# Patient Record
Sex: Female | Born: 1935
Health system: Southern US, Community
[De-identification: ages and names within clinical notes are randomized; demographics above are authoritative.]

## PROBLEM LIST (undated history)

## (undated) ENCOUNTER — Emergency Department (HOSPITAL_COMMUNITY): Admission: EM

## (undated) DIAGNOSIS — I4891 Unspecified atrial fibrillation: Secondary | ICD-10-CM

## (undated) DIAGNOSIS — I10 Essential (primary) hypertension: Secondary | ICD-10-CM

## (undated) DIAGNOSIS — I2699 Other pulmonary embolism without acute cor pulmonale: Secondary | ICD-10-CM

## (undated) DIAGNOSIS — E039 Hypothyroidism, unspecified: Secondary | ICD-10-CM

## (undated) DIAGNOSIS — I471 Supraventricular tachycardia, unspecified: Secondary | ICD-10-CM

## (undated) DIAGNOSIS — J84112 Idiopathic pulmonary fibrosis: Secondary | ICD-10-CM

## (undated) DIAGNOSIS — M1990 Unspecified osteoarthritis, unspecified site: Secondary | ICD-10-CM

## (undated) DIAGNOSIS — K219 Gastro-esophageal reflux disease without esophagitis: Secondary | ICD-10-CM

## (undated) DIAGNOSIS — I509 Heart failure, unspecified: Secondary | ICD-10-CM

## (undated) DIAGNOSIS — H3530 Unspecified macular degeneration: Secondary | ICD-10-CM

## (undated) DIAGNOSIS — D126 Benign neoplasm of colon, unspecified: Secondary | ICD-10-CM

## (undated) DIAGNOSIS — I340 Nonrheumatic mitral (valve) insufficiency: Secondary | ICD-10-CM

## (undated) HISTORY — DX: Gastro-esophageal reflux disease without esophagitis: K21.9

## (undated) HISTORY — PX: OVARIAN CYST REMOVAL: SHX89

## (undated) HISTORY — DX: Idiopathic pulmonary fibrosis: J84.112

## (undated) HISTORY — DX: Unspecified osteoarthritis, unspecified site: M19.90

## (undated) HISTORY — DX: Heart failure, unspecified: I50.9

---

## 1958-02-14 HISTORY — PX: APPENDECTOMY: SHX54

## 1983-02-15 HISTORY — PX: CHOLECYSTECTOMY: SHX55

## 2004-10-29 ENCOUNTER — Ambulatory Visit: Payer: Self-pay | Admitting: Unknown Physician Specialty

## 2005-11-15 ENCOUNTER — Ambulatory Visit: Payer: Self-pay | Admitting: Unknown Physician Specialty

## 2006-01-03 ENCOUNTER — Emergency Department (HOSPITAL_COMMUNITY): Admission: EM | Admit: 2006-01-03 | Discharge: 2006-01-03 | Payer: Self-pay | Admitting: Emergency Medicine

## 2006-01-18 ENCOUNTER — Ambulatory Visit: Payer: Self-pay | Admitting: Unknown Physician Specialty

## 2006-11-23 ENCOUNTER — Ambulatory Visit: Payer: Self-pay | Admitting: Unknown Physician Specialty

## 2007-11-27 ENCOUNTER — Ambulatory Visit: Payer: Self-pay | Admitting: Unknown Physician Specialty

## 2008-12-19 ENCOUNTER — Ambulatory Visit: Payer: Self-pay | Admitting: Unknown Physician Specialty

## 2009-10-18 LAB — HM MAMMOGRAPHY: HM Mammogram: NORMAL

## 2009-10-18 LAB — HM DEXA SCAN

## 2009-10-18 LAB — HM COLONOSCOPY

## 2009-10-18 LAB — HM PAP SMEAR: HM Pap smear: NORMAL

## 2009-10-21 LAB — HM PAP SMEAR

## 2010-01-19 ENCOUNTER — Ambulatory Visit: Payer: Self-pay | Admitting: Unknown Physician Specialty

## 2010-06-07 ENCOUNTER — Emergency Department: Payer: Self-pay | Admitting: Unknown Physician Specialty

## 2010-07-29 ENCOUNTER — Ambulatory Visit: Payer: Self-pay | Admitting: Internal Medicine

## 2010-11-29 ENCOUNTER — Ambulatory Visit (INDEPENDENT_AMBULATORY_CARE_PROVIDER_SITE_OTHER): Payer: Medicare Other | Admitting: Internal Medicine

## 2010-11-29 ENCOUNTER — Encounter: Payer: Self-pay | Admitting: Internal Medicine

## 2010-11-29 VITALS — BP 143/80 | Temp 98.2°F | Ht 60.0 in | Wt 138.0 lb

## 2010-11-29 DIAGNOSIS — K219 Gastro-esophageal reflux disease without esophagitis: Secondary | ICD-10-CM | POA: Insufficient documentation

## 2010-11-29 DIAGNOSIS — R0602 Shortness of breath: Secondary | ICD-10-CM

## 2010-11-29 DIAGNOSIS — E785 Hyperlipidemia, unspecified: Secondary | ICD-10-CM | POA: Insufficient documentation

## 2010-11-29 DIAGNOSIS — Z1211 Encounter for screening for malignant neoplasm of colon: Secondary | ICD-10-CM | POA: Insufficient documentation

## 2010-11-29 DIAGNOSIS — H3530 Unspecified macular degeneration: Secondary | ICD-10-CM

## 2010-11-29 DIAGNOSIS — Z1239 Encounter for other screening for malignant neoplasm of breast: Secondary | ICD-10-CM

## 2010-11-29 NOTE — Patient Instructions (Addendum)
Your breathing problems may be aggravated by the smoke your trash burning makes.  You should wear a mask when you are doing this or stop burning your trash.    You need 1200 mg oc calcium and 1000 units of vitamin d for good bone health.  I will check your last bone density test to see if yo need more treatment.

## 2010-11-29 NOTE — Progress Notes (Signed)
  Subjective:    Patient ID: Frances Kent, female    DOB: [birth year], 77 y.o.   MRN: [id number]  HPI Frances Kent is a healthy 77 yo female referred here by several family members to establish care.  Her previous PCP was Francia Greaves of [facility]. She feels geneally well but has a recent history of acute dyspnea. Since March has had several episodes of acute dyspnea and chest tightness unable to draw her breath,  Unassociated with diaphoresis, nausea or jaw/shoulder/arm pain.  She reports that it initially occurred after eating soup.  She was treated in ER for acid reflux with Nexium and albuterol and referred to Dr. Welton Flakes, pulmonologist who worked her up for PR and asthma with a CT scan and pulmonary function tests..  She has not had an epsiode in several weeks  [place]  But she and other family members burn their trash regularly  and do not use masks .   Past Medical History  Diagnosis Date  . GERD (gastroesophageal reflux disease)     No current outpatient prescriptions on file prior to visit.    Review of Systems  Constitutional: Negative for fever, chills and unexpected weight change.  HENT: Negative for hearing loss, ear pain, nosebleeds, congestion, sore throat, facial swelling, rhinorrhea, sneezing, mouth sores, trouble swallowing, neck pain, neck stiffness, voice change, postnasal drip, sinus pressure, tinnitus and ear discharge.   Eyes: Negative for pain, discharge, redness and visual disturbance.  Respiratory: Negative for cough, chest tightness, shortness of breath, wheezing and stridor.   Cardiovascular: Negative for chest pain, palpitations and leg swelling.  Musculoskeletal: Negative for myalgias and arthralgias.  Skin: Negative for color change and rash.  Neurological: Negative for dizziness, weakness, light-headedness and headaches.  Hematological: Negative for adenopathy.  Psychiatric/Behavioral: Positive for  sleep disturbance.   BP 143/80  Temp 98.2 F (36.8 C)  Ht 5' (1.524 m)  Wt 138 lb (62.596 kg)  BMI 26.95 kg/m2     Objective:   Physical Exam  Constitutional: She is oriented to person, place, and time. She appears well-developed and well-nourished.  HENT:  Mouth/Throat: Oropharynx is clear and moist.  Eyes: EOM are normal. Pupils are equal, round, and reactive to light. No scleral icterus.  Neck: Normal range of motion. Neck supple. No JVD present. No thyromegaly present.  Cardiovascular: Normal rate, regular rhythm, normal heart sounds and intact distal pulses.   Pulmonary/Chest: Effort normal and breath sounds normal.  Abdominal: Soft. Bowel sounds are normal. She exhibits no mass. There is no tenderness.  Musculoskeletal: Normal range of motion. She exhibits no edema.  Lymphadenopathy:    She has no cervical adenopathy.  Neurological: She is alert and oriented to person, place, and time.  Skin: Skin is warm and dry.  Psychiatric: She has a normal mood and affect.          Assessment & Plan:  Dyspnea:  etiology presumed to be due to esophgeal spasm from reflux since her cough and her episodes have resolved with daily use of Nexium. but willl request records from Dr. Welton Flakes.  Symptoms may be aggravated by pollution from burning trash.  Advised to refrain from burning trash near her home or to at least wear a  mask when outside, and to keep windows closed. She has had no cardiology workup but this may be pruent in the future if symptoms recur given borherline HTN and history of hyperlipidemia.

## 2011-01-17 ENCOUNTER — Encounter: Payer: Self-pay | Admitting: Internal Medicine

## 2011-01-17 ENCOUNTER — Ambulatory Visit (INDEPENDENT_AMBULATORY_CARE_PROVIDER_SITE_OTHER): Payer: Medicare Other | Admitting: Internal Medicine

## 2011-01-17 VITALS — BP 128/74 | HR 72 | Temp 98.1°F | Resp 14 | Ht 62.5 in | Wt 141.5 lb

## 2011-01-17 DIAGNOSIS — J84112 Idiopathic pulmonary fibrosis: Secondary | ICD-10-CM | POA: Insufficient documentation

## 2011-01-17 DIAGNOSIS — N765 Ulceration of vagina: Secondary | ICD-10-CM

## 2011-01-17 DIAGNOSIS — N72 Inflammatory disease of cervix uteri: Secondary | ICD-10-CM

## 2011-01-17 DIAGNOSIS — E785 Hyperlipidemia, unspecified: Secondary | ICD-10-CM

## 2011-01-17 DIAGNOSIS — R5383 Other fatigue: Secondary | ICD-10-CM

## 2011-01-17 DIAGNOSIS — Z0000 Encounter for general adult medical examination without abnormal findings: Secondary | ICD-10-CM

## 2011-01-17 DIAGNOSIS — Z1239 Encounter for other screening for malignant neoplasm of breast: Secondary | ICD-10-CM

## 2011-01-17 DIAGNOSIS — M8580 Other specified disorders of bone density and structure, unspecified site: Secondary | ICD-10-CM

## 2011-01-17 DIAGNOSIS — R5381 Other malaise: Secondary | ICD-10-CM

## 2011-01-17 DIAGNOSIS — N766 Ulceration of vulva: Secondary | ICD-10-CM

## 2011-01-17 DIAGNOSIS — B009 Herpesviral infection, unspecified: Secondary | ICD-10-CM

## 2011-01-17 LAB — COMPREHENSIVE METABOLIC PANEL
Albumin: 4.4 g/dL (ref 3.5–5.2)
BUN: 12 mg/dL (ref 6–23)
CO2: 26 mEq/L (ref 19–32)
Calcium: 9.2 mg/dL (ref 8.4–10.5)
Chloride: 107 mEq/L (ref 96–112)
GFR: 65.13 mL/min (ref >60.00)
Glucose, Bld: 90 mg/dL (ref 70–99)
Potassium: 4.4 mEq/L (ref 3.5–5.1)
Sodium: 142 mEq/L (ref 135–145)
Total Protein: 7.2 g/dL (ref 6.0–8.3)

## 2011-01-17 LAB — CBC WITH DIFFERENTIAL/PLATELET
Basophils Absolute: 0.1 10*3/uL (ref 0.0–0.1)
Eosinophils Relative: 1.7 % (ref 0.0–5.0)
Lymphs Abs: 2.1 10*3/uL (ref 0.7–4.0)
Monocytes Relative: 7.5 % (ref 3.0–12.0)
Neutrophils Relative %: 48.2 % (ref 43.0–77.0)
Platelets: 237 10*3/uL (ref 150.0–400.0)
RDW: 14.8 % — ABNORMAL HIGH (ref 11.5–14.6)
WBC: 5.2 10*3/uL (ref 4.5–10.5)

## 2011-01-17 LAB — TSH: TSH: 3.04 u[IU]/mL (ref 0.35–5.50)

## 2011-01-17 LAB — LDL CHOLESTEROL, DIRECT: Direct LDL: 181.4 mg/dL

## 2011-01-17 LAB — LIPID PANEL: HDL: 74.8 mg/dL (ref >39.00)

## 2011-01-17 MED ORDER — ACYCLOVIR 400 MG PO TABS: 400.0000 mg | ORAL_TABLET | ORAL | Status: AC

## 2011-01-17 NOTE — Patient Instructions (Signed)
We will schedule your mammogram and bone density test in the next month.  I encourage you to walkd for 20 minutes a few taimes weekly .  Keep your followup with Dr. Welton Flakes

## 2011-01-17 NOTE — Progress Notes (Signed)
  Subjective:    Frances Kent is a 77 y.o. female who presents for an annual exam. The patient has no complaints today. The patient is currently sexually active. GYN screening history: last pap: approximate date Sept 2011 and was normal. The patient wears seatbelts: yes. The patient participates in regular exercise: no. Has the patient ever been transfused or tattooed?: no. The patient reports that there is not domestic violence in her life.   Menstrual History: OB History    Grav Para Term Preterm Abortions TAB SAB Ect Mult Living                  Menarche age: 41    The following portions of the patient's history were reviewed and updated as appropriate: allergies, current medications, past family history, past medical history, past social history, past surgical history and problem list.  Review of Systems A comprehensive review of systems was negative except for: Genitourinary: positive for pruritic area at natal cleft that breaks out Musculoskeletal: positive for arthralgias    Objective:    BP 128/74  Pulse 72  Temp(Src) 98.1 F (36.7 C) (Oral)  Resp 14  Ht 5' 2.5" (1.588 m)  Wt 141 lb 8 oz (64.184 kg)  BMI 25.47 kg/m2  SpO2 99%  General Appearance:    Alert, cooperative, no distress, appears stated age  Head:    Normocephalic, without obvious abnormality, atraumatic  Eyes:    PERRL, conjunctiva/corneas clear, EOM's intact, fundi    benign, both eyes  Ears:    Normal TM's and external ear canals, both ears  Nose:   Nares normal, septum midline, mucosa normal, no drainage    or sinus tenderness  Throat:   Lips, mucosa, and tongue normal; teeth and gums normal  Neck:   Supple, symmetrical, trachea midline, no adenopathy;    thyroid:  no enlargement/tenderness/nodules; no carotid   bruit or JVD  Back:     Symmetric, no curvature, ROM normal, no CVA tenderness  Lungs:     Clear to auscultation bilaterally, respirations unlabored  Chest Wall:    No tenderness or  deformity   Heart:    Regular rate and rhythm, S1 and S2 normal, no murmur, rub   or gallop  Breast Exam:    No tenderness, masses, or nipple abnormality  Abdomen:     Soft, non-tender, bowel sounds active all four quadrants,    no masses, no organomegaly  Genitalia:    Once dime sized ulcerated lesion right labia minorNormal female without lesion, discharge or tenderness  Rectal:    3 discrete ulcerations in natal cleft  Extremities:   Extremities normal, atraumatic, no cyanosis or edema  Pulses:   2+ and symmetric all extremities  Skin:   Skin color, texture, turgor normal, no rashes or lesions  Lymph nodes:   Cervical, supraclavicular, and axillary nodes normal  Neurologic:   CNII-XII intact, normal strength, sensation and reflexes    throughout  .    Assessment:    Healthy female exam.    Plan:     Breast self exam technique reviewed and patient encouraged to perform self-exam monthly. Follow up in 6 months. Mammogram.

## 2011-01-18 DIAGNOSIS — N766 Ulceration of vulva: Secondary | ICD-10-CM | POA: Insufficient documentation

## 2011-01-18 LAB — HSV 1 ANTIBODY, IGG: HSV 1 Glycoprotein G Ab, IgG: 0.16 IV

## 2011-01-18 LAB — HSV 2 ANTIBODY, IGG: HSV 2 Glycoprotein G Ab, IgG: 9 IV — ABNORMAL HIGH

## 2011-01-18 NOTE — Assessment & Plan Note (Signed)
LDL is 181 , HDL is in the 70's.  Prior LDL was 161 in 3/12 , HDL was 86.  FH of stroke but no early CAD.  Will recommended trial of pravastatin.

## 2011-01-18 NOTE — Assessment & Plan Note (Signed)
She has a labial lesion and three in the natal cleft that suggest herpes simplex but were not intact so Tzanck could not be done.  Serologies for HS Simplex 1 and 2 were sent.  Both of her husbands had extramarital affairs, so the likelihood is high that she is infected.  RX for acyclovir given to patient to start if serology is positive.

## 2011-01-19 ENCOUNTER — Telehealth: Payer: Self-pay | Admitting: Internal Medicine

## 2011-01-19 MED ORDER — PRAVASTATIN SODIUM 40 MG PO TABS: 40.0000 mg | ORAL_TABLET | Freq: Every evening | ORAL | Status: DC

## 2011-01-19 NOTE — Telephone Encounter (Signed)
Message copied by Duncan Dull on Wed Jan 19, 2011  6:48 PM ------      Message from: Darletta Moll A      Created: Wed Jan 19, 2011  1:37 PM       Notified patient of results.  She stated she has never taken a cholesterol medication before.

## 2011-01-19 NOTE — Telephone Encounter (Signed)
I am recommending a trial of pravastatin bc her cholesterol is so high, to prevent strokes and heart attacks.  rx sent to pharmacy.  If she decides to try it I would like her to return in 6 weeks  for a CMET and repeat fasting lipids.

## 2011-01-19 NOTE — Progress Notes (Signed)
Addended by: Duncan Dull on: 01/19/2011 06:48 PM   Modules accepted: Orders

## 2011-01-21 NOTE — Telephone Encounter (Signed)
Patient notified of message, she doesn't know if she wants to start it yet, but will let us know what she wants to do.

## 2011-02-21 LAB — HM MAMMOGRAPHY: HM Mammogram: NORMAL

## 2011-03-09 ENCOUNTER — Ambulatory Visit: Payer: Self-pay | Admitting: Internal Medicine

## 2011-04-12 ENCOUNTER — Other Ambulatory Visit: Payer: Self-pay | Admitting: Internal Medicine

## 2011-04-12 MED ORDER — ESOMEPRAZOLE MAGNESIUM 40 MG PO CPDR: 40.0000 mg | DELAYED_RELEASE_CAPSULE | Freq: Every day | ORAL | Status: DC

## 2011-04-14 ENCOUNTER — Telehealth: Payer: Self-pay | Admitting: Internal Medicine

## 2011-04-14 NOTE — Telephone Encounter (Signed)
[phone] Pt called wanting to be seen today or tomorrow for cough she has had this for 2 weeks.

## 2011-04-14 NOTE — Telephone Encounter (Signed)
I called patient she stated she has had a non-productive call for 2 weeks.  She made an appt for tomorrow.

## 2011-04-15 ENCOUNTER — Encounter: Payer: Self-pay | Admitting: Internal Medicine

## 2011-04-15 ENCOUNTER — Ambulatory Visit (INDEPENDENT_AMBULATORY_CARE_PROVIDER_SITE_OTHER): Payer: 59 | Admitting: Internal Medicine

## 2011-04-15 DIAGNOSIS — R05 Cough: Secondary | ICD-10-CM

## 2011-04-15 DIAGNOSIS — R059 Cough, unspecified: Secondary | ICD-10-CM

## 2011-04-15 NOTE — Progress Notes (Signed)
Subjective:    Patient ID: Frances Kent, female    DOB: 04/29/1930, 78 y.o.   MRN: [id number]  HPI  presents with 3 week history of cough, occasionally productive of clear sputum..  No sinus pain or congestion .  No wheezing or or shortness of breath.  No recent travel ,  Some sick contacts with grandchildren ages 43 and 12 has tried alka seltzer cold plus, no effect .Marland Kitchen  Past Medical History  Diagnosis Date  . GERD (gastroesophageal reflux disease)   . IPF (idiopathic pulmonary fibrosis) 20111    by CT, PFTS done by [facility]   Current Outpatient Prescriptions on File Prior to Visit  Medication Sig Dispense Refill  . b complex vitamins tablet Take 1 tablet by mouth daily.        . calcium-vitamin D (OSCAL WITH D) 250-125 MG-UNIT per tablet Take 1 tablet by mouth daily.        Marland Kitchen esomeprazole (NEXIUM) 40 MG capsule Take 1 capsule (40 mg total) by mouth daily.  30 capsule  3  . pravastatin (PRAVACHOL) 40 MG tablet Take 1 tablet (40 mg total) by mouth every evening.  30 tablet  11    Review of Systems  Constitutional: Negative for fever, chills and unexpected weight change.  HENT: Negative for hearing loss, ear pain, nosebleeds, congestion, sore throat, facial swelling, rhinorrhea, sneezing, mouth sores, trouble swallowing, neck pain, neck stiffness, voice change, postnasal drip, sinus pressure, tinnitus and ear discharge.   Eyes: Negative for pain, discharge, redness and visual disturbance.  Respiratory: Negative for cough, chest tightness, shortness of breath, wheezing and stridor.   Cardiovascular: Negative for chest pain, palpitations and leg swelling.  Musculoskeletal: Negative for myalgias and arthralgias.  Skin: Negative for color change and rash.  Neurological: Negative for dizziness, weakness, light-headedness and headaches.  Hematological: Negative for adenopathy.       Objective:   Physical Exam  Constitutional: She is oriented to person, place, and time. She appears  well-developed and well-nourished.  HENT:  Mouth/Throat: Oropharynx is clear and moist.  Eyes: EOM are normal. Pupils are equal, round, and reactive to light. No scleral icterus.  Neck: Normal range of motion. Neck supple. No JVD present. No thyromegaly present.  Cardiovascular: Normal rate, regular rhythm, normal heart sounds and intact distal pulses.   Pulmonary/Chest: Effort normal and breath sounds normal.  Abdominal: Soft. Bowel sounds are normal. She exhibits no mass. There is no tenderness.  Musculoskeletal: Normal range of motion. She exhibits no edema.  Lymphadenopathy:    She has no cervical adenopathy.  Neurological: She is alert and oriented to person, place, and time.  Skin: Skin is warm and dry.  Psychiatric: She has a normal mood and affect.          Assessment & Plan:   Cough Her history and exam are not suggestive of anything more than viral infection.  Recommended benadryl, saline lavage, and delsym.  Retrun in one week if no improvement    Updated Medication List Outpatient Encounter Prescriptions as of 04/15/2011  Medication Sig Dispense Refill  . b complex vitamins tablet Take 1 tablet by mouth daily.        . calcium-vitamin D (OSCAL WITH D) 250-125 MG-UNIT per tablet Take 1 tablet by mouth daily.        Marland Kitchen esomeprazole (NEXIUM) 40 MG capsule Take 1 capsule (40 mg total) by mouth daily.  30 capsule  3  . pravastatin (PRAVACHOL) 40 MG tablet Take 1  tablet (40 mg total) by mouth every evening.  30 tablet  11

## 2011-04-15 NOTE — Patient Instructions (Signed)
I think your cough may be coming from post nasal drainage  I would like you to take generic benadryl (dipenhydramine) 25 mg up to three times daily,  Take one hour before bedtime.      I also want you to use salt water nasal spray called Simply Saline:    Two squirts up each side twice dailyu, in the morning and in the evening   If no better by Monday,  Call and I will send you an antibiotic to take.

## 2011-04-17 DIAGNOSIS — R05 Cough: Secondary | ICD-10-CM | POA: Insufficient documentation

## 2011-04-17 DIAGNOSIS — R059 Cough, unspecified: Secondary | ICD-10-CM | POA: Insufficient documentation

## 2011-04-17 NOTE — Assessment & Plan Note (Signed)
Her history and exam are not suggestive of anything more than viral infection.  Recommended benadryl, saline lavage, and delsym.  Retrun in one week if no improvement

## 2011-04-20 ENCOUNTER — Encounter: Payer: Self-pay | Admitting: Internal Medicine

## 2011-07-19 ENCOUNTER — Encounter: Payer: Self-pay | Admitting: Internal Medicine

## 2011-07-19 ENCOUNTER — Ambulatory Visit (INDEPENDENT_AMBULATORY_CARE_PROVIDER_SITE_OTHER): Payer: 59 | Admitting: Internal Medicine

## 2011-07-19 VITALS — BP 140/70 | HR 65 | Temp 97.8°F | Resp 14 | Wt 140.2 lb

## 2011-07-19 DIAGNOSIS — M79609 Pain in unspecified limb: Secondary | ICD-10-CM

## 2011-07-19 DIAGNOSIS — E785 Hyperlipidemia, unspecified: Secondary | ICD-10-CM

## 2011-07-19 DIAGNOSIS — M25561 Pain in right knee: Secondary | ICD-10-CM

## 2011-07-19 NOTE — Patient Instructions (Signed)
You can try red yeast rice in capsule form  for 600 mg twice daily instead of pravastatin .    First take a month break from the pravastatin,  then try the red yeast rice.    I recommend taking a baby arpirin daily to prevent strokes and heart attacks,  81 mg   You can use 2 tylenol and2  Ibuprofen   tiwce daily for pain

## 2011-07-19 NOTE — Assessment & Plan Note (Signed)
She agreed to trial of pravastatin for LDL 175 in December but has had increased arthralgias since starting it.  Suspended statin for now.

## 2011-07-19 NOTE — Assessment & Plan Note (Signed)
She has crepitus without point tenderness on exam.  pain has been aggravated by statin therapy.  Discussed suspending therapy for a few months.  exercises given.

## 2011-07-19 NOTE — Progress Notes (Signed)
Patient ID: SHAWNAY BRAMEL, female   DOB: January 09, 1931, 78 y.o.   MRN: [id number]  Patient Active Problem List  Diagnoses  . Screening for breast cancer  . Macular degeneration, left eye  . Screening for colon cancer  . GERD (gastroesophageal reflux disease)  . Hyperlipidemia LDL goal <160  . IPF (idiopathic pulmonary fibrosis)  . Genital ulcer, female  . Cough  . Knee pain, acute, right    Subjective:  CC:   Chief Complaint  Patient presents with  . Follow-up    HPI:   Rotha Cassels Fergusonis a 78 y.o. female who presents for 6 month follow up on chronic conditions.  Feels generally well except for right knee pain which has become more severe with changes in position and descending stairs.  It has been more pronounced since she start taking pravasttin for LDL 182.  She is Not taking aspirin daily.    Past Medical History  Diagnosis Date  . GERD (gastroesophageal reflux disease)   . IPF (idiopathic pulmonary fibrosis) 20111    by CT, PFTS done by Welton Flakes    Past Surgical History  Procedure Date  . Appendectomy 1960  . Cholecystectomy 1985         The following portions of the patient's history were reviewed and updated as appropriate: Allergies, current medications, and problem list.    Review of Systems:   12 Pt  review of systems was negative except those addressed in the HPI,     History   Social History  . Marital Status: Single    Spouse Name: N/A    Number of Children: N/A  . Years of Education: N/A   Occupational History  . Not on file.   Social History Main Topics  . Smoking status: Never Smoker   . Smokeless tobacco: Never Used   Comment: 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 , worked at [employer], [employer]  . Alcohol Use: No  . Drug Use: No  . Sexually Active: Not on file   Other Topics Concern  . Not on file   Social History Narrative   Has caretaking responsibility for 3 young grandchildren who live with her    Objective:  BP 140/70  Pulse 65   Temp(Src) 97.8 F (36.6 C) (Oral)  Resp 14  Wt 140 lb 4 oz (63.617 kg)  SpO2 96%  General appearance: alert, cooperative and appears stated age Ears: normal TM's and external ear canals both ears Throat: lips, mucosa, and tongue normal; teeth and gums normal Neck: no adenopathy, no carotid bruit, supple, symmetrical, trachea midline and thyroid not enlarged, symmetric, no tenderness/mass/nodules Back: symmetric, no curvature. ROM normal. No CVA tenderness. Lungs: clear to auscultation bilaterally Heart: regular rate and rhythm, S1, S2 normal, no murmur, click, rub or gallop Abdomen: soft, non-tender; bowel sounds normal; no masses,  no organomegaly Pulses: 2+ and symmetric Skin: Skin color, texture, turgor normal. No rashes or lesions Lymph nodes: Cervical, supraclavicular, and axillary nodes normal.  Assessment and Plan:  Knee pain, acute, right She has crepitus without point tenderness on exam.  pain has been aggravated by statin therapy.  Discussed suspending therapy for a few months.  exercises given.   Hyperlipidemia LDL goal <160 She agreed to trial of pravastatin for LDL 175 in December but has had increased arthralgias since starting it.  Suspended statin for now.     Updated Medication List Outpatient Encounter Prescriptions as of 07/19/2011  Medication Sig Dispense Refill  . b complex vitamins tablet Take 1  tablet by mouth daily.        . calcium-vitamin D (OSCAL WITH D) 250-125 MG-UNIT per tablet Take 1 tablet by mouth daily.        Marland Kitchen esomeprazole (NEXIUM) 40 MG capsule Take 1 capsule (40 mg total) by mouth daily.  30 capsule  3  . pravastatin (PRAVACHOL) 40 MG tablet Take 1 tablet (40 mg total) by mouth every evening.  30 tablet  11     No orders of the defined types were placed in this encounter.    No Follow-up on file.

## 2011-08-09 ENCOUNTER — Other Ambulatory Visit: Payer: Self-pay | Admitting: Internal Medicine

## 2011-08-09 DIAGNOSIS — Z1211 Encounter for screening for malignant neoplasm of colon: Secondary | ICD-10-CM

## 2011-08-19 ENCOUNTER — Other Ambulatory Visit: Payer: 59

## 2011-08-19 DIAGNOSIS — Z1211 Encounter for screening for malignant neoplasm of colon: Secondary | ICD-10-CM

## 2011-08-23 ENCOUNTER — Ambulatory Visit: Payer: Self-pay | Admitting: Internal Medicine

## 2011-08-25 NOTE — ED Provider Notes (Signed)
Order(s) created erroneously. Erroneous order ID: [id number] Order moved by: Lurline Hare Order move date/time: 08/25/2011  3:48 PM Source Patient:    [id number] Source Contact: 07/19/2011 Destination Patient:   [id number] Destination Contact: 11/22/2010

## 2011-09-13 ENCOUNTER — Other Ambulatory Visit: Payer: Self-pay | Admitting: Internal Medicine

## 2011-10-12 ENCOUNTER — Encounter: Payer: Self-pay | Admitting: Internal Medicine

## 2011-10-12 ENCOUNTER — Ambulatory Visit (INDEPENDENT_AMBULATORY_CARE_PROVIDER_SITE_OTHER): Payer: 59 | Admitting: Internal Medicine

## 2011-10-12 VITALS — BP 120/70 | HR 70 | Temp 98.0°F | Resp 16

## 2011-10-12 DIAGNOSIS — R319 Hematuria, unspecified: Secondary | ICD-10-CM

## 2011-10-12 DIAGNOSIS — K921 Melena: Secondary | ICD-10-CM

## 2011-10-12 LAB — CBC WITH DIFFERENTIAL/PLATELET
Basophils Absolute: 0.1 10*3/uL (ref 0.0–0.1)
Basophils Relative: 2 % — ABNORMAL HIGH (ref 0–1)
Eosinophils Relative: 1 % (ref 0–5)
HCT: 40.1 % (ref 36.0–46.0)
Hemoglobin: 13.5 g/dL (ref 12.0–15.0)
MCH: 29.5 pg (ref 26.0–34.0)
MCHC: 33.7 g/dL (ref 30.0–36.0)
MCV: 87.6 fL (ref 78.0–100.0)
Monocytes Absolute: 0.6 10*3/uL (ref 0.1–1.0)
Monocytes Relative: 10 % (ref 3–12)
RDW: 15 % (ref 11.5–15.5)

## 2011-10-12 LAB — POCT URINALYSIS DIPSTICK
Ketones, UA: NEGATIVE
Protein, UA: NEGATIVE
Spec Grav, UA: 1.02
pH, UA: 7

## 2011-10-12 LAB — IRON AND TIBC
%SAT: 31 % (ref 20–55)
Iron: 94 ug/dL (ref 42–145)

## 2011-10-12 NOTE — Progress Notes (Signed)
Patient ID: Frances Kent, female   DOB: November 22, 1930, 78 y.o.   MRN: [id number]   Patient Active Problem List  Diagnosis  . Screening for breast cancer  . Macular degeneration, left eye  . Screening for colon cancer  . GERD (gastroesophageal reflux disease)  . Hyperlipidemia LDL goal <160  . IPF (idiopathic pulmonary fibrosis)  . Genital ulcer, female  . Cough  . Knee pain, acute, right  . Hematochezia    Subjective:  CC:   Chief Complaint  Patient presents with  . Hematuria    doesn't know where the blood is coming from  . Rectal Bleeding    HPI:   Frances Kent a 78 y.o. female who presents 4 to 6 week history of intermittent bright red blood noticed when she uses the bathroom.  Not on toilet paper .  But sees it in the water when she has a bowel movement.  Bowels move usually twice daily , no straining and no pain with defecation. No abdominal pain. Does not take any anticoagulants. She has noticed more gas and bloating lately.  She is not certain whether the blood is coming from her bowels or from her bladder. She has had no prior screening for colon cancer as she has  refused colonoscopies .    Past Medical History  Diagnosis Date  . GERD (gastroesophageal reflux disease)   . IPF (idiopathic pulmonary fibrosis) 20111    by CT, PFTS done by Welton Flakes    Past Surgical History  Procedure Date  . Appendectomy 1960  . Cholecystectomy 1985    The following portions of the patient's history were reviewed and updated as appropriate: Allergies, current medications, and problem list.    Review of Systems:   A comprehensive ROS was done and positive for occasional rectal bleeding, increased gas, and joint pain (chronic).   The rest was negative.   History   Social History  . Marital Status: Single    Spouse Name: N/A    Number of Children: N/A  . Years of Education: N/A   Occupational History  . Not on file.   Social History Main Topics  . Smoking status:  Never Smoker   . Smokeless tobacco: Never Used   Comment: 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 , worked at [employer], [employer]  . Alcohol Use: No  . Drug Use: No  . Sexually Active: Not on file   Other Topics Concern  . Not on file   Social History Narrative   Has caretaking responsibility for 3 young grandchildren who live with her    Objective:  BP 120/70  Pulse 70  Temp 98 F (36.7 C) (Oral)  Resp 16  SpO2 97%  General appearance: alert, cooperative and appears stated age Neck: no adenopathy, no carotid bruit, supple, symmetrical, trachea midline and thyroid not enlarged, symmetric, no tenderness/mass/nodules Back: symmetric, no curvature. ROM normal. No CVA tenderness. Lungs: clear to auscultation bilaterally Heart: regular rate and rhythm, S1, S2 normal, no murmur, click, rub or gallop Abdomen: soft, non-tender; bowel sounds normal; no masses,  no organomegaly Pulses: 2+ and symmetric Skin: Skin color, texture, turgor normal. No rashes or lesions Rectal:  nontender exam,  No hemorrhoids ,  FOBT trace positive Lymph nodes: Cervical, supraclavicular, and axillary nodes normal.  Assessment and Plan:  Hematochezia Her rectal exam was nontender and the fecal cold blood test was faintly positive. Urinalysis was trace positive but sent for microscopic analysis which was negative for blood. She is not anemic  and her iron stores are normal. I have recommended that she have a colonoscopy and she is finally acquiesced. Will refer to local GI for screening. Her   Updated Medication List Outpatient Encounter Prescriptions as of 10/12/2011  Medication Sig Dispense Refill  . b complex vitamins tablet Take 1 tablet by mouth daily.        . calcium-vitamin D (OSCAL WITH D) 250-125 MG-UNIT per tablet Take 1 tablet by mouth daily.        Marland Kitchen NEXIUM 40 MG capsule TAKE ONE CAPSULE BY MOUTH EVERY DAY  30 each  6  . DISCONTD: pravastatin (PRAVACHOL) 40 MG tablet Take 1 tablet (40 mg total) by mouth every  evening.  30 tablet  11     Orders Placed This Encounter  Procedures  . Urinalysis, Routine w reflex microscopic [[facility]]  . CBC with Differential  . Ferritin  . Iron and TIBC  . CBC with Differential  . Ferritin  . Iron and TIBC  . Urinalysis, Routine w reflex microscopic  . Ambulatory referral to Gastroenterology  . POCT Urinalysis Dipstick    No Follow-up on file.

## 2011-10-12 NOTE — Patient Instructions (Addendum)
I am referring you to Dr. Mechele Collin for a colonoscopy  Please ask your eye doctor about the aspirin

## 2011-10-13 DIAGNOSIS — K921 Melena: Secondary | ICD-10-CM | POA: Insufficient documentation

## 2011-10-13 LAB — URINALYSIS, ROUTINE W REFLEX MICROSCOPIC
Glucose, UA: NEGATIVE mg/dL
Hgb urine dipstick: NEGATIVE
Leukocytes, UA: NEGATIVE
Nitrite: NEGATIVE
Protein, ur: NEGATIVE mg/dL
pH: 6.5 (ref 5.0–8.0)

## 2011-10-13 NOTE — Assessment & Plan Note (Signed)
Her rectal exam was nontender and the fecal cold blood test was faintly positive. Urinalysis was trace positive but sent for microscopic analysis which was negative for blood. She is not anemic and her iron stores are normal. I have recommended that she have a colonoscopy and she is finally acquiesced. Will refer to local GI for screening. Her

## 2011-10-22 LAB — HM COLONOSCOPY: HM Colonoscopy: NORMAL

## 2011-11-03 ENCOUNTER — Ambulatory Visit: Payer: Self-pay | Admitting: Unknown Physician Specialty

## 2011-11-07 LAB — PATHOLOGY REPORT

## 2011-11-25 ENCOUNTER — Encounter: Payer: Self-pay | Admitting: Internal Medicine

## 2012-02-20 ENCOUNTER — Encounter: Payer: 59 | Admitting: Internal Medicine

## 2012-03-23 ENCOUNTER — Ambulatory Visit (INDEPENDENT_AMBULATORY_CARE_PROVIDER_SITE_OTHER): Payer: Medicare Other | Admitting: Internal Medicine

## 2012-03-23 ENCOUNTER — Encounter: Payer: Self-pay | Admitting: Internal Medicine

## 2012-03-23 VITALS — BP 132/60 | HR 61 | Temp 97.7°F | Resp 16 | Ht 63.0 in | Wt 143.5 lb

## 2012-03-23 DIAGNOSIS — Z1239 Encounter for other screening for malignant neoplasm of breast: Secondary | ICD-10-CM

## 2012-03-23 DIAGNOSIS — K219 Gastro-esophageal reflux disease without esophagitis: Secondary | ICD-10-CM

## 2012-03-23 DIAGNOSIS — E785 Hyperlipidemia, unspecified: Secondary | ICD-10-CM

## 2012-03-23 DIAGNOSIS — Z0000 Encounter for general adult medical examination without abnormal findings: Secondary | ICD-10-CM | POA: Insufficient documentation

## 2012-03-23 DIAGNOSIS — R5381 Other malaise: Secondary | ICD-10-CM

## 2012-03-23 DIAGNOSIS — Z1211 Encounter for screening for malignant neoplasm of colon: Secondary | ICD-10-CM

## 2012-03-23 DIAGNOSIS — E559 Vitamin D deficiency, unspecified: Secondary | ICD-10-CM

## 2012-03-23 DIAGNOSIS — H3530 Unspecified macular degeneration: Secondary | ICD-10-CM

## 2012-03-23 DIAGNOSIS — R5383 Other fatigue: Secondary | ICD-10-CM | POA: Insufficient documentation

## 2012-03-23 LAB — COMPREHENSIVE METABOLIC PANEL
ALT: 20 U/L (ref 0–35)
AST: 22 U/L (ref 0–37)
Albumin: 4.1 g/dL (ref 3.5–5.2)
CO2: 27 mEq/L (ref 19–32)
Calcium: 9.2 mg/dL (ref 8.4–10.5)
Chloride: 105 mEq/L (ref 96–112)
GFR: 68.47 mL/min (ref >60.00)
Potassium: 4 mEq/L (ref 3.5–5.1)
Sodium: 139 mEq/L (ref 135–145)
Total Protein: 6.9 g/dL (ref 6.0–8.3)

## 2012-03-23 LAB — CBC WITH DIFFERENTIAL/PLATELET
Basophils Absolute: 0.1 10*3/uL (ref 0.0–0.1)
Eosinophils Absolute: 0.1 10*3/uL (ref 0.0–0.7)
Hemoglobin: 12.7 g/dL (ref 12.0–15.0)
Lymphocytes Relative: 44.2 % (ref 12.0–46.0)
MCHC: 33.5 g/dL (ref 30.0–36.0)
Monocytes Relative: 7.9 % (ref 3.0–12.0)
Neutro Abs: 2.4 10*3/uL (ref 1.4–7.7)
Neutrophils Relative %: 45.6 % (ref 43.0–77.0)
Platelets: 247 10*3/uL (ref 150.0–400.0)
RDW: 15.3 % — ABNORMAL HIGH (ref 11.5–14.6)

## 2012-03-23 LAB — LIPID PANEL: HDL: 77.8 mg/dL (ref >39.00)

## 2012-03-23 LAB — TSH: TSH: 1.79 u[IU]/mL (ref 0.35–5.50)

## 2012-03-23 LAB — LDL CHOLESTEROL, DIRECT: Direct LDL: 156.1 mg/dL

## 2012-03-23 MED ORDER — PANTOPRAZOLE SODIUM 40 MG PO TBEC: 40.0000 mg | DELAYED_RELEASE_TABLET | Freq: Every day | ORAL | Status: DC

## 2012-03-23 NOTE — Assessment & Plan Note (Signed)
receives injections every 8 hours

## 2012-03-23 NOTE — Assessment & Plan Note (Addendum)
nexium has become very $$$ with change in insurance and she is nervous about trying  an alternative,  No prior trials  rx protonix.Marland Kitchen

## 2012-03-23 NOTE — Assessment & Plan Note (Signed)
Annual exam including breast , pelvic exam was done today.

## 2012-03-23 NOTE — Patient Instructions (Addendum)
We are going to try switching your Nexium to a more affordable medication called Protonix .  It is used at the hospital and has the same indications and side effects as Nexium and will be more affordable.   We are checking your labs today and will  Call you with the results  Mammogram to be scheduled for you

## 2012-03-23 NOTE — Progress Notes (Signed)
Patient ID: Frances Kent, female   DOB: 1930/07/26, 79 y.o.   MRN: [id number]  The patient is here for annual Medicare wellness examination and management of other chronic and acute problems.   The risk factors are reflected in the social history.  The roster of all physicians providing medical care to patient - is listed in the Snapshot section of the chart.  Activities of daily living:  The patient is 100% independent in all ADLs: dressing, toileting, feeding as well as independent mobility  Home safety : The patient has smoke detectors in the home. They wear seatbelts.  There are no firearms at home. There is no violence in the home.   There is no risks for hepatitis, STDs or HIV. There is no   history of blood transfusion. They have no travel history to infectious disease endemic areas of the world.  The patient has seen their dentist in the last six month. They have seen their eye doctor in the last year. They admit to slight hearing difficulty with regard to whispered voices and some television programs.  They have deferred audiologic testing in the last year.  They do not  have excessive sun exposure. Discussed the need for sun protection: hats, long sleeves and use of sunscreen if there is significant sun exposure.   Diet: the importance of a healthy diet is discussed. They do have a healthy diet.  The benefits of regular aerobic exercise were discussed. She walks 4 times per week ,  20 minutes.   Depression screen: there are no signs or vegative symptoms of depression- irritability, change in appetite, anhedonia, sadness/tearfullness.  Cognitive assessment: the patient manages all their financial and personal affairs and is actively engaged. They could relate day,date,year and events; recalled 2/3 objects at 3 minutes; performed clock-face test normally.  The following portions of the patient's history were reviewed and updated as appropriate: allergies, current medications, past  family history, past medical history,  past surgical history, past social history  and problem list.  Visual acuity was not assessed per patient preference since she has regular follow up with her ophthalmologist. Hearing and body mass index were assessed and reviewed.   During the course of the visit the patient was educated and counseled about appropriate screening and preventive services including : fall prevention , diabetes screening, nutrition counseling, colorectal cancer screening, and recommended immunizations.    Objective  BP 132/60  Pulse 61  Temp(Src) 97.7 F (36.5 C) (Oral)  Resp 16  Ht 5\' 3"  (1.6 m)  Wt 143 lb 8 oz (65.091 kg)  BMI 25.43 kg/m2  SpO2 96%  General Appearance:    Alert, cooperative, no distress, appears stated age  Head:    Normocephalic, without obvious abnormality, atraumatic  Eyes:    PERRL, conjunctiva/corneas clear, EOM's intact, fundi    benign, both eyes  Ears:    Normal TM's and external ear canals, both ears  Nose:   Nares normal, septum midline, mucosa normal, no drainage    or sinus tenderness  Throat:   Lips, mucosa, and tongue normal; teeth and gums normal  Neck:   Supple, symmetrical, trachea midline, no adenopathy;    thyroid:  no enlargement/tenderness/nodules; no carotid   bruit or JVD  Back:     Symmetric, no curvature, ROM normal, no CVA tenderness  Lungs:     Clear to auscultation bilaterally, respirations unlabored  Chest Wall:    No tenderness or deformity   Heart:  Regular rate and rhythm, S1 and S2 normal, no murmur, rub   or gallop  Breast Exam:    No tenderness, masses, or nipple abnormality  Abdomen:     Soft, non-tender, bowel sounds active all four quadrants,    no masses, no organomegaly  Genitalia:    Normal female without lesion, discharge or tenderness,  bladder and rectal wall are intacct, no masses     Extremities:   Extremities normal, atraumatic, no cyanosis or edema  Pulses:   2+ and symmetric all extremities   Skin:   Skin color, texture, turgor normal, no rashes or lesions  Lymph nodes:   Cervical, supraclavicular, and axillary nodes normal  Neurologic:   CNII-XII intact, normal strength, sensation and reflexes    Throughout     Assessment and Plan  Macular degeneration, left eye receives injections every 8 hours  GERD (gastroesophageal reflux disease) nexium has become very $$$ with change in insurance and she is nervous about trying  an alternative,  No prior trials  rx protonix.Marland Kitchen   Routine general medical examination at a health care facility Annual exam including breast , pelvic exam was done today.   Fatigue Checking TSH CBC and CMET  Hyperlipidemia LDL goal <160 Mild, untreated per patient preference   Screening for colon cancer Done annually with FOBTs at annual physical.  She had a  Colonoscopy in 2013 for hematochezia.   Screening for breast cancer Normal Jan 2013.    Updated Medication List Outpatient Encounter Prescriptions as of 03/23/2012  Medication Sig Dispense Refill  . aspirin 81 MG tablet Take 81 mg by mouth 2 (two) times a week.      Marland Kitchen b complex vitamins tablet Take 1 tablet by mouth daily.        . calcium-vitamin D (OSCAL WITH D) 250-125 MG-UNIT per tablet Take 1 tablet by mouth daily.        . [DISCONTINUED] NEXIUM 40 MG capsule TAKE ONE CAPSULE BY MOUTH EVERY DAY  30 each  6  . pantoprazole (PROTONIX) 40 MG tablet Take 1 tablet (40 mg total) by mouth daily.  30 tablet  3   No facility-administered encounter medications on file as of 03/23/2012.

## 2012-03-23 NOTE — Assessment & Plan Note (Signed)
Checking TSH CBC and CMET

## 2012-03-24 LAB — VITAMIN D 25 HYDROXY (VIT D DEFICIENCY, FRACTURES): Vit D, 25-Hydroxy: 27 ng/mL — ABNORMAL LOW (ref 30–89)

## 2012-03-25 NOTE — Assessment & Plan Note (Signed)
Mild, untreated per patient preference

## 2012-03-25 NOTE — Assessment & Plan Note (Addendum)
Normal Jan 2013.

## 2012-03-25 NOTE — Assessment & Plan Note (Addendum)
Done annually with FOBTs at annual physical.  She had a  Colonoscopy in 2013 for hematochezia.

## 2012-03-26 ENCOUNTER — Telehealth: Payer: Self-pay | Admitting: *Deleted

## 2012-03-26 NOTE — Telephone Encounter (Signed)
One year followup, sooner if needed.

## 2012-03-26 NOTE — Telephone Encounter (Signed)
Called patient to inform her of lab results. While on the phone she asked when does she need to come back for an appointment? She forgot to schedule it before leaving the other day. Should she come back in a year, 6 months or 3 months?

## 2012-03-26 NOTE — Telephone Encounter (Signed)
LMTCB

## 2012-03-28 NOTE — Telephone Encounter (Signed)
Patient informed and copy of labs mailed to patient per her request.

## 2012-05-15 ENCOUNTER — Ambulatory Visit: Payer: Self-pay | Admitting: Internal Medicine

## 2012-06-06 ENCOUNTER — Encounter: Payer: Self-pay | Admitting: Internal Medicine

## 2012-09-05 ENCOUNTER — Ambulatory Visit: Payer: Self-pay | Admitting: Internal Medicine

## 2012-09-19 ENCOUNTER — Other Ambulatory Visit: Payer: Self-pay | Admitting: *Deleted

## 2012-09-19 DIAGNOSIS — K219 Gastro-esophageal reflux disease without esophagitis: Secondary | ICD-10-CM

## 2012-09-19 MED ORDER — PANTOPRAZOLE SODIUM 40 MG PO TBEC: 40.0000 mg | DELAYED_RELEASE_TABLET | Freq: Every day | ORAL | Status: DC

## 2012-10-02 ENCOUNTER — Encounter: Payer: Self-pay | Admitting: Adult Health

## 2012-10-02 ENCOUNTER — Ambulatory Visit (INDEPENDENT_AMBULATORY_CARE_PROVIDER_SITE_OTHER): Payer: Medicare Other | Admitting: Adult Health

## 2012-10-02 VITALS — BP 110/62 | HR 91 | Temp 98.4°F | Resp 14 | Wt 139.0 lb

## 2012-10-02 DIAGNOSIS — I499 Cardiac arrhythmia, unspecified: Secondary | ICD-10-CM | POA: Insufficient documentation

## 2012-10-02 DIAGNOSIS — R05 Cough: Secondary | ICD-10-CM

## 2012-10-02 DIAGNOSIS — R059 Cough, unspecified: Secondary | ICD-10-CM

## 2012-10-02 LAB — CBC WITH DIFFERENTIAL/PLATELET
Basophils Absolute: 0.1 10*3/uL (ref 0.0–0.1)
Eosinophils Absolute: 0 10*3/uL (ref 0.0–0.7)
Eosinophils Relative: 0.3 % (ref 0.0–5.0)
MCV: 89.2 fl (ref 78.0–100.0)
Monocytes Absolute: 0.9 10*3/uL (ref 0.1–1.0)
Neutrophils Relative %: 56.8 % (ref 43.0–77.0)
Platelets: 209 10*3/uL (ref 150.0–400.0)
WBC: 6.2 10*3/uL (ref 4.5–10.5)

## 2012-10-02 LAB — BASIC METABOLIC PANEL
GFR: 61.64 mL/min (ref >60.00)
Glucose, Bld: 101 mg/dL — ABNORMAL HIGH (ref 70–99)
Potassium: 4.6 mEq/L (ref 3.5–5.1)
Sodium: 137 mEq/L (ref 135–145)

## 2012-10-02 MED ORDER — GUAIFENESIN-CODEINE 100-10 MG/5ML PO SOLN: 5.0000 mL | Freq: Three times a day (TID) | ORAL | Status: DC | PRN

## 2012-10-02 NOTE — Assessment & Plan Note (Signed)
Irregular heart rhythm noted during exam. EKG shows few PACs. Patient is asymptomatic. WNL.

## 2012-10-02 NOTE — Assessment & Plan Note (Addendum)
Presents with worsening cough since weekend. No fever, sob. She has been taking protonix daily. No sick contacts. Recommend use of otc cough syrup. I will check a cbc and bmet today. I will also give her a prescription for Robitussin AC should her cough keep her up at night. Advised not to drive if she takes this cough medication as it will make her sleepy.

## 2012-10-02 NOTE — Patient Instructions (Addendum)
  Recommended delsym. Retrun in one week if no improvement.  Please have your labs drawn prior to leaving the office.  I am giving you a prescription for Robitussin AC which has codeine. Fill the prescription if your cough keeps you up at night. This medication will make you sleepy. Do not drive when you take.

## 2012-10-02 NOTE — Progress Notes (Signed)
  Subjective:    Patient ID: Frances Kent, female    DOB: 08/25/1930, 79 y.o.   MRN: [id number]  HPI  Patient presents to clinic with c/o coughing which began this past weekend. She reports "just not feeling well". She reports having a mild headache and no appetite. She took aleve and reports she felt some improvement but then again felt bad towards the end of the day. Cough is dry. She denies fever, chills, chest pain or shortness of breath. She also denies recent sick contacts. Patient's husband has lung cancer and she is his primary care giver.   Current Outpatient Prescriptions on File Prior to Visit  Medication Sig Dispense Refill  . aspirin 81 MG tablet Take 81 mg by mouth 2 (two) times a week.      Marland Kitchen b complex vitamins tablet Take 1 tablet by mouth daily.        . calcium-vitamin D (OSCAL WITH D) 250-125 MG-UNIT per tablet Take 1 tablet by mouth daily.        . pantoprazole (PROTONIX) 40 MG tablet Take 1 tablet (40 mg total) by mouth daily.  30 tablet  3   No current facility-administered medications on file prior to visit.     Review of Systems  Constitutional: Positive for appetite change and fatigue. Negative for fever and chills.       Decreased appetite x 2-3 days.  HENT: Negative for congestion, rhinorrhea, postnasal drip and sinus pressure.   Respiratory: Positive for cough. Negative for shortness of breath and wheezing.   Cardiovascular: Negative for chest pain, palpitations and leg swelling.  Psychiatric/Behavioral: Negative.      BP 110/62  Pulse 91  Temp(Src) 98.4 F (36.9 C) (Oral)  Resp 14  Wt 139 lb (63.05 kg)  BMI 24.63 kg/m2  SpO2 97%     Objective:   Physical Exam  Constitutional: She is oriented to person, place, and time. She appears well-developed and well-nourished.  Appears not feeling well  Cardiovascular: Normal rate and intact distal pulses.  An irregular rhythm present. Exam reveals no gallop and no friction rub.   No murmur  heard. Pulmonary/Chest: Effort normal and breath sounds normal. No respiratory distress. She has no wheezes. She has no rales.  Lymphadenopathy:    She has no cervical adenopathy.  Neurological: She is alert and oriented to person, place, and time.  Skin: Skin is warm and dry.  Psychiatric: She has a normal mood and affect. Her behavior is normal. Judgment and thought content normal.      Assessment & Plan:

## 2013-01-02 ENCOUNTER — Ambulatory Visit: Payer: Self-pay | Admitting: Internal Medicine

## 2013-02-04 ENCOUNTER — Telehealth: Payer: Self-pay | Admitting: Internal Medicine

## 2013-02-04 NOTE — Telephone Encounter (Signed)
Patient Information:  Caller Name: Frances Kent  Phone: [phone]  Patient: Frances Kent, Frances Kent  Gender: Female  DOB: [birth year]  Age: 79 Years  PCP: Duncan Dull (Adults only)  Office Follow Up:  Does the office need to follow up with this patient?: No/ Appts are full, pt declines an appt at another ofc. Will keep her appt for 02/05/13 @ 1130.   Instructions For The Office: N/A  RN Note:  Pt feels lightheaded, has been under a lot of stess. She feels like she needs something to calm her down. Lost her husband in 10/14.  Symptoms  Reason For Call & Symptoms: Lightheaded  Reviewed Health History In EMR: Yes  Reviewed Medications In EMR: Yes  Reviewed Allergies In EMR: Yes  Reviewed Surgeries / Procedures: Yes  Date of Onset of Symptoms: 02/01/2013  Guideline(s) Used:  Dizziness  Disposition Per Guideline:   See Today in Office  Reason For Disposition Reached:   Patient wants to be seen  Advice Given:  Temporary Dizziness  is usually a harmless symptom. It can be caused by not drinking enough water during sports or hot weather. It can also be caused by skipping a meal, too much sun exposure, standing up suddenly, standing too long in one place or even a viral illness.  Drink Fluids:  Drink several glasses of fruit juice, other clear fluids, or water. This will improve hydration and blood glucose. If you have a fever or have had heat exposure, make sure the fluids are cold.  Rest for 1-2 Hours:  Lie down with feet elevated for 1 hour. This will improve blood flow and increase blood flow to the brain.  Call Back If:  You become worse.  Patient Will Follow Care Advice:  YES  Appointment Scheduled:  02/05/2013 11:30:00 Appointment Scheduled Provider:  Orville Govern

## 2013-02-04 NOTE — Telephone Encounter (Signed)
Pt scheduled for tomorrow

## 2013-02-05 ENCOUNTER — Emergency Department: Payer: Self-pay | Admitting: Emergency Medicine

## 2013-02-05 ENCOUNTER — Ambulatory Visit: Payer: Medicare Other | Admitting: Adult Health

## 2013-02-05 ENCOUNTER — Encounter: Payer: Self-pay | Admitting: Adult Health

## 2013-02-05 VITALS — BP 144/76 | HR 78 | Temp 98.1°F | Resp 16 | Wt 135.0 lb

## 2013-02-05 DIAGNOSIS — R55 Syncope and collapse: Secondary | ICD-10-CM

## 2013-02-05 DIAGNOSIS — R5383 Other fatigue: Secondary | ICD-10-CM

## 2013-02-05 DIAGNOSIS — R5381 Other malaise: Secondary | ICD-10-CM

## 2013-02-05 DIAGNOSIS — R42 Dizziness and giddiness: Secondary | ICD-10-CM

## 2013-02-05 LAB — COMPREHENSIVE METABOLIC PANEL
Alkaline Phosphatase: 92 U/L
Anion Gap: 7 (ref 7–16)
BUN: 9 mg/dL (ref 7–18)
Bilirubin,Total: 0.5 mg/dL (ref 0.2–1.0)
Creatinine: 0.67 mg/dL (ref 0.60–1.30)
EGFR (African American): >60
EGFR (Non-African Amer.): >60
Glucose: 87 mg/dL (ref 65–99)
Osmolality: 274 (ref 275–301)
SGOT(AST): 26 U/L (ref 15–37)
Sodium: 138 mmol/L (ref 136–145)
Total Protein: 7.3 g/dL (ref 6.4–8.2)

## 2013-02-05 LAB — URINALYSIS, COMPLETE
Bacteria: NONE SEEN
Glucose,UR: NEGATIVE mg/dL (ref 0–75)
Nitrite: NEGATIVE
RBC,UR: 2 /HPF (ref 0–5)
Specific Gravity: 1.009 (ref 1.003–1.030)
Squamous Epithelial: NONE SEEN
WBC UR: 2 /HPF (ref 0–5)

## 2013-02-05 LAB — POCT URINALYSIS DIPSTICK
Bilirubin, UA: NEGATIVE
Glucose, UA: NEGATIVE
Leukocytes, UA: NEGATIVE
Nitrite, UA: NEGATIVE

## 2013-02-05 LAB — CBC
HCT: 38 % (ref 35.0–47.0)
MCHC: 32.6 g/dL (ref 32.0–36.0)
Platelet: 205 10*3/uL (ref 150–440)
WBC: 5.9 10*3/uL (ref 3.6–11.0)

## 2013-02-05 NOTE — Progress Notes (Signed)
Subjective:    Patient ID: Frances Kent, female    DOB: [birth year], 79 y.o.   MRN: [id number]  HPI  Patient is a very pleasant 79 y/o female who presents to clinic with c/o feeling lightheaded, feeling like she is going to pass out, "just not feeling well". She reports that she was going to [employer] yesterday with her granddaughter and had to return home because she felt bad. Symptoms worse with changing position. She denies fever, shortness of breath or chest pain. She does report "a twitch" behind her left breast "every once in a while". The "twitch" does not last long. There is no pain with inspiration. She denies any sick contacts; however, she has been out and about.  Patient also reports pain in the lateral aspect of her right outter leg. She reports the pain began on Sunday right after she took a shower. She reports the pain as a burning sensation. She denies pain in the calf area, swelling or redness of her lower extremity.   Current Outpatient Prescriptions on File Prior to Visit  Medication Sig Dispense Refill  . aspirin 81 MG tablet Take 81 mg by mouth 2 (two) times a week.      Marland Kitchen b complex vitamins tablet Take 1 tablet by mouth daily.        . calcium-vitamin D (OSCAL WITH D) 250-125 MG-UNIT per tablet Take 1 tablet by mouth daily.        . pantoprazole (PROTONIX) 40 MG tablet Take 1 tablet (40 mg total) by mouth daily.  30 tablet  3   No current facility-administered medications on file prior to visit.    Review of Systems  Constitutional: Positive for fatigue. Negative for fever and chills.       "just not feeling well"  HENT: Negative.   Eyes: Negative.   Respiratory: Negative for cough, chest tightness, shortness of breath and wheezing.   Cardiovascular: Negative for palpitations and leg swelling.       "twitch" behind left breast  Gastrointestinal: Negative for nausea, vomiting, abdominal pain and diarrhea.       Grumbling of abdomen without diarrhea, n/v    Genitourinary: Negative.  Negative for dysuria, hematuria and flank pain.  Musculoskeletal: Negative for arthralgias, back pain, joint swelling and myalgias.  Skin: Negative.   Allergic/Immunologic: Negative.   Neurological: Positive for light-headedness. Negative for dizziness, tremors, seizures, speech difficulty, weakness and numbness.  Hematological: Negative.   Psychiatric/Behavioral: Negative.        Objective:   Physical Exam  Constitutional: She is oriented to person, place, and time. She appears well-developed and well-nourished. No distress.  HENT:  Head: Normocephalic and atraumatic.  Mouth/Throat: Oropharynx is clear and moist.  Neck: Normal range of motion.  Cardiovascular: Normal rate, regular rhythm and normal heart sounds.   Orthostatic sitting to standing. Drop in systolic 20 mm/Hg  Pulmonary/Chest: Effort normal and breath sounds normal. No respiratory distress. She has no wheezes. She has no rales.  Abdominal: Soft.  Musculoskeletal: Normal range of motion. She exhibits no edema and no tenderness.  Lymphadenopathy:    She has no cervical adenopathy.  Neurological: She is alert and oriented to person, place, and time.  Skin: Skin is warm and dry. No rash noted. No erythema.  Psychiatric: She has a normal mood and affect. Her behavior is normal. Judgment and thought content normal.   BP 144/76  Pulse 78  Temp(Src) 98.1 F (36.7 C) (Oral)  Resp 16  Wt  135 lb (61.236 kg)  SpO2 96%        Assessment & Plan:

## 2013-02-05 NOTE — Progress Notes (Signed)
Pre visit review using our clinic review tool, if applicable. No additional management support is needed unless otherwise documented below in the visit note. 

## 2013-02-06 ENCOUNTER — Emergency Department: Payer: Self-pay | Admitting: Emergency Medicine

## 2013-02-06 ENCOUNTER — Encounter: Payer: Self-pay | Admitting: Adult Health

## 2013-02-06 DIAGNOSIS — R55 Syncope and collapse: Secondary | ICD-10-CM | POA: Insufficient documentation

## 2013-02-06 LAB — BASIC METABOLIC PANEL
Anion Gap: 7 (ref 7–16)
Calcium, Total: 9.5 mg/dL (ref 8.5–10.1)
Chloride: 108 mmol/L — ABNORMAL HIGH (ref 98–107)
Creatinine: 0.71 mg/dL (ref 0.60–1.30)
EGFR (Non-African Amer.): >60
Osmolality: 274 (ref 275–301)
Sodium: 138 mmol/L (ref 136–145)

## 2013-02-06 LAB — CBC WITH DIFFERENTIAL/PLATELET
Basophil #: 0.1 10*3/uL (ref 0.0–0.1)
Eosinophil #: 0.1 10*3/uL (ref 0.0–0.7)
HCT: 42.4 % (ref 35.0–47.0)
Lymphocyte #: 2 10*3/uL (ref 1.0–3.6)
Lymphocyte %: 28.7 %
MCH: 29 pg (ref 26.0–34.0)
MCV: 90 fL (ref 80–100)
Monocyte %: 6.6 %
Neutrophil #: 4.3 10*3/uL (ref 1.4–6.5)
Neutrophil %: 62.7 %
RBC: 4.72 10*6/uL (ref 3.80–5.20)
RDW: 15.2 % — ABNORMAL HIGH (ref 11.5–14.5)

## 2013-02-06 NOTE — Assessment & Plan Note (Addendum)
EKG changes showing low voltage in precordial leads. Near syncopal episodes. Orthostatic. Do not feel comfortable sending her home. Obvious that she is not feeling well. Sending patient for further evaluation at [facility] ED. Patient in agreement. Called ED to advise patient being sent. Note greater than 25 minutes were spent in face-to-face communication with patient in the assessment, evaluation, planning and implementation of care pertaining to this problem.

## 2013-02-07 LAB — URINE CULTURE
Colony Count: NO GROWTH
Organism ID, Bacteria: NO GROWTH

## 2013-02-13 ENCOUNTER — Encounter: Payer: Self-pay | Admitting: Internal Medicine

## 2013-02-13 ENCOUNTER — Ambulatory Visit (INDEPENDENT_AMBULATORY_CARE_PROVIDER_SITE_OTHER): Payer: Medicare Other | Admitting: Internal Medicine

## 2013-02-13 VITALS — BP 130/60 | HR 73 | Temp 97.7°F | Resp 12 | Wt 131.0 lb

## 2013-02-13 DIAGNOSIS — R55 Syncope and collapse: Secondary | ICD-10-CM

## 2013-02-13 DIAGNOSIS — I499 Cardiac arrhythmia, unspecified: Secondary | ICD-10-CM

## 2013-02-13 MED ORDER — ALPRAZOLAM 0.25 MG PO TABS: 0.2500 mg | ORAL_TABLET | Freq: Two times a day (BID) | ORAL | Status: DC | PRN

## 2013-02-13 NOTE — Patient Instructions (Signed)
I am sending you to Dr Laurena Bering to make sure your "episodes" of near fainting are not due to your heart   I am prescribing you a medication for anxiety called alprazolam.  You may take it nor more than once or twice daily if needed for anxiety or insomnia   If you need to use it every day,  We should consider a safer daily medication

## 2013-02-13 NOTE — Progress Notes (Signed)
Patient ID: Frances Kent, female   DOB: 1930/05/17, 78 y.o.   MRN: [id number]   Patient Active Problem List   Diagnosis Date Noted  . Near syncope 02/06/2013  . Irregular heart rhythm 10/02/2012  . Routine general medical examination at a health care facility 03/23/2012  . Fatigue 03/23/2012  . Hematochezia 10/13/2011  . Knee pain, acute, right 07/19/2011  . Cough 04/17/2011  . IPF (idiopathic pulmonary fibrosis)   . Screening for breast cancer 11/29/2010  . Macular degeneration, left eye 11/29/2010  . Screening for colon cancer 11/29/2010  . Hyperlipidemia LDL goal <160 11/29/2010  . GERD (gastroesophageal reflux disease)     Subjective:  CC:   Chief Complaint  Patient presents with  . Hospitalization Follow-up    ED vist twice last week - pt felt lightheaded about to past out. pt states right leg was hurting her / felt shakey or nervous.     HPI:   Frances Schiller Fergusonis a 79 y.o. female who presents for ER follow up for presyncopal feelings .  Was sent initially by [facility] on Dec 23 after evaluation here.  Patient was evaluated with troponin and EKG and sent home.  EKG abnormal with incomplete R BBB.  NO prior cardiology evaluation.  VS in ED noted SBP 190 with repeat 122 systolic.  No history of hypertension,  Chest pain , No recent GI illness,  Not sure if episodes are provoked by irregular rhythm or palpitations. No prior cardiology evaluation .      Past Medical History  Diagnosis Date  . GERD (gastroesophageal reflux disease)   . IPF (idiopathic pulmonary fibrosis) 20111    by CT, PFTS done by Welton Flakes    Past Surgical History  Procedure Laterality Date  . Appendectomy  1960  . Cholecystectomy  1985       The following portions of the patient's history were reviewed and updated as appropriate: Allergies, current medications, and problem list.    Review of Systems:   Patient denies headache, fevers, malaise, unintentional weight loss, skin rash, eye pain, sinus  congestion and sinus pain, sore throat, dysphagia,  hemoptysis , cough, dyspnea, wheezing, chest pain, palpitations, orthopnea, edema, abdominal pain, nausea, melena, diarrhea, constipation, flank pain, dysuria, hematuria, urinary  Frequency, nocturia, numbness, tingling, seizures,  Focal weakness, Loss of consciousness,  Tremor, insomnia, depression, anxiety, and suicidal ideation.       History   Social History  . Marital Status: Single    Spouse Name: N/A    Number of Children: N/A  . Years of Education: N/A   Occupational History  . Not on file.   Social History Main Topics  . Smoking status: Never Smoker   . Smokeless tobacco: Never Used     Comment: 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 , worked at [employer], [employer]  . Alcohol Use: No  . Drug Use: No  . Sexual Activity: Not on file   Other Topics Concern  . Not on file   Social History Narrative   Has caretaking responsibility for 3 young grandchildren who live with her    Objective:  Filed Vitals:   02/13/13 1210  BP: 130/60  Pulse: 73  Temp:   Resp: 12     General appearance: alert, cooperative and appears stated age Ears: normal TM's and external ear canals both ears Throat: lips, mucosa, and tongue normal; teeth and gums normal Neck: no adenopathy, no carotid bruit, supple, symmetrical, trachea midline and thyroid not enlarged, symmetric, no tenderness/mass/nodules Back:  symmetric, no curvature. ROM normal. No CVA tenderness. Lungs: clear to auscultation bilaterally Heart: regular rate and rhythm, S1, S2 normal, no murmur, click, rub or gallop Abdomen: soft, non-tender; bowel sounds normal; no masses,  no organomegaly Pulses: 2+ and symmetric Skin: Skin color, texture, turgor normal. No rashes or lesions Lymph nodes: Cervical, supraclavicular, and axillary nodes normal.  Assessment and Plan:  Near syncope Recurrent, with no evidence of orthostasis, anemia, thyroid dysfunction ,  Or sleep apnea .  Since the problems  is recrurent I am recommended referral to cardioloy fore rule out arrhythmia.  Patient prefers Coralie Keens   Updated Medication List Outpatient Encounter Prescriptions as of 02/13/2013  Medication Sig  . aspirin 81 MG tablet Take 81 mg by mouth 2 (two) times a week.  Marland Kitchen b complex vitamins tablet Take 1 tablet by mouth daily.    . calcium-vitamin D (OSCAL WITH D) 250-125 MG-UNIT per tablet Take 1 tablet by mouth daily.    . pantoprazole (PROTONIX) 40 MG tablet Take 1 tablet (40 mg total) by mouth daily.  Marland Kitchen ALPRAZolam (XANAX) 0.25 MG tablet Take 1 tablet (0.25 mg total) by mouth 2 (two) times daily as needed for anxiety.     Orders Placed This Encounter  Procedures  . Ambulatory referral to Cardiology    No Follow-up on file.

## 2013-02-13 NOTE — Progress Notes (Signed)
Pre visit review using our clinic review tool, if applicable. No additional management support is needed unless otherwise documented below in the visit note. 

## 2013-02-14 ENCOUNTER — Encounter: Payer: Self-pay | Admitting: Internal Medicine

## 2013-02-14 NOTE — Assessment & Plan Note (Signed)
Recurrent, with no evidence of orthostasis, anemia, thyroid dysfunction ,  Or sleep apnea .  Since the problems is recrurent I am recommended referral to cardioloy fore rule out arrhythmia.  Patient prefers Frances Kent

## 2013-02-27 ENCOUNTER — Telehealth: Payer: Self-pay | Admitting: Emergency Medicine

## 2013-02-27 NOTE — Telephone Encounter (Signed)
Referral to [facility] underway for [facility] 

## 2013-03-07 ENCOUNTER — Telehealth: Payer: Self-pay | Admitting: *Deleted

## 2013-03-07 NOTE — Telephone Encounter (Signed)
Patient stated the medication alprazolam is helping please advise as to refill ?

## 2013-03-07 NOTE — Telephone Encounter (Signed)
Patient stated that she has taken all 45 pills but that they seem like they really help her. She has been taking two per day for shaking and jitteriness  And when she takes them she feels very calm. Patient would like to know if you will refill until she can make an appointment to discuss.

## 2013-03-07 NOTE — Telephone Encounter (Signed)
We discussed that if she finds herself using it daily we need to discuss a daily medication that would be more safe.  Has she used #45 pills since Dec 31st?

## 2013-03-07 NOTE — Telephone Encounter (Signed)
Left message for patient to please return call to office. 

## 2013-03-08 MED ORDER — ALPRAZOLAM 0.25 MG PO TABS: 0.2500 mg | ORAL_TABLET | Freq: Two times a day (BID) | ORAL | Status: DC | PRN

## 2013-03-08 NOTE — Telephone Encounter (Signed)
Refill on alprazolam for quantity 60 authorized. No additional refills until she is seen.

## 2013-03-08 NOTE — Telephone Encounter (Signed)
Patient notified of instructions.

## 2013-03-11 NOTE — Telephone Encounter (Signed)
[facility] approved 4 visits, exp 06/02/13. Auth # [id number]

## 2013-03-26 ENCOUNTER — Telehealth: Payer: Self-pay | Admitting: Emergency Medicine

## 2013-03-26 NOTE — Telephone Encounter (Signed)
Referral underway for [facility] 

## 2013-03-27 ENCOUNTER — Ambulatory Visit (INDEPENDENT_AMBULATORY_CARE_PROVIDER_SITE_OTHER): Payer: Medicare HMO | Admitting: Internal Medicine

## 2013-03-27 ENCOUNTER — Encounter: Payer: Self-pay | Admitting: Internal Medicine

## 2013-03-27 VITALS — BP 138/62 | HR 68 | Temp 97.6°F | Resp 16 | Ht 62.75 in | Wt 135.2 lb

## 2013-03-27 DIAGNOSIS — Z1239 Encounter for other screening for malignant neoplasm of breast: Secondary | ICD-10-CM

## 2013-03-27 DIAGNOSIS — F411 Generalized anxiety disorder: Secondary | ICD-10-CM

## 2013-03-27 DIAGNOSIS — K219 Gastro-esophageal reflux disease without esophagitis: Secondary | ICD-10-CM

## 2013-03-27 DIAGNOSIS — R55 Syncope and collapse: Secondary | ICD-10-CM

## 2013-03-27 DIAGNOSIS — I341 Nonrheumatic mitral (valve) prolapse: Secondary | ICD-10-CM

## 2013-03-27 DIAGNOSIS — Z0000 Encounter for general adult medical examination without abnormal findings: Secondary | ICD-10-CM

## 2013-03-27 DIAGNOSIS — R5383 Other fatigue: Secondary | ICD-10-CM

## 2013-03-27 DIAGNOSIS — R5381 Other malaise: Secondary | ICD-10-CM

## 2013-03-27 DIAGNOSIS — E559 Vitamin D deficiency, unspecified: Secondary | ICD-10-CM

## 2013-03-27 DIAGNOSIS — I059 Rheumatic mitral valve disease, unspecified: Secondary | ICD-10-CM

## 2013-03-27 DIAGNOSIS — E785 Hyperlipidemia, unspecified: Secondary | ICD-10-CM

## 2013-03-27 LAB — CBC WITH DIFFERENTIAL/PLATELET
BASOS ABS: 0.1 10*3/uL (ref 0.0–0.1)
Basophils Relative: 0.8 % (ref 0.0–3.0)
EOS ABS: 0.1 10*3/uL (ref 0.0–0.7)
Eosinophils Relative: 1.7 % (ref 0.0–5.0)
HCT: 41.7 % (ref 36.0–46.0)
Hemoglobin: 13.3 g/dL (ref 12.0–15.0)
LYMPHS ABS: 2.3 10*3/uL (ref 0.7–4.0)
Lymphocytes Relative: 36.9 % (ref 12.0–46.0)
MCHC: 32 g/dL (ref 30.0–36.0)
MCV: 92.8 fl (ref 78.0–100.0)
MONO ABS: 0.5 10*3/uL (ref 0.1–1.0)
Monocytes Relative: 7.9 % (ref 3.0–12.0)
NEUTROS ABS: 3.3 10*3/uL (ref 1.4–7.7)
Neutrophils Relative %: 52.7 % (ref 43.0–77.0)
PLATELETS: 266 10*3/uL (ref 150.0–400.0)
RBC: 4.49 Mil/uL (ref 3.87–5.11)
RDW: 15.5 % — AB (ref 11.5–14.6)
WBC: 6.3 10*3/uL (ref 4.5–10.5)

## 2013-03-27 LAB — COMPREHENSIVE METABOLIC PANEL
ALK PHOS: 78 U/L (ref 39–117)
ALT: 19 U/L (ref 0–35)
AST: 24 U/L (ref 0–37)
Albumin: 4.3 g/dL (ref 3.5–5.2)
BILIRUBIN TOTAL: 0.9 mg/dL (ref 0.3–1.2)
BUN: 12 mg/dL (ref 6–23)
CO2: 28 meq/L (ref 19–32)
CREATININE: 0.8 mg/dL (ref 0.4–1.2)
Calcium: 9.3 mg/dL (ref 8.4–10.5)
Chloride: 105 mEq/L (ref 96–112)
GFR: 72.21 mL/min (ref >60.00)
GLUCOSE: 88 mg/dL (ref 70–99)
POTASSIUM: 4.5 meq/L (ref 3.5–5.1)
Sodium: 141 mEq/L (ref 135–145)
TOTAL PROTEIN: 7 g/dL (ref 6.0–8.3)

## 2013-03-27 LAB — LIPID PANEL
Cholesterol: 282 mg/dL — ABNORMAL HIGH (ref 0–200)
HDL: 81.1 mg/dL (ref >39.00)
Total CHOL/HDL Ratio: 3
Triglycerides: 95 mg/dL (ref 0.0–149.0)
VLDL: 19 mg/dL (ref 0.0–40.0)

## 2013-03-27 LAB — LDL CHOLESTEROL, DIRECT: LDL DIRECT: 185.3 mg/dL

## 2013-03-27 LAB — TSH: TSH: 3.66 u[IU]/mL (ref 0.35–5.50)

## 2013-03-27 NOTE — Progress Notes (Signed)
Pre-visit discussion using our clinic review tool. No additional management support is needed unless otherwise documented below in the visit note.  

## 2013-03-28 DIAGNOSIS — I341 Nonrheumatic mitral (valve) prolapse: Secondary | ICD-10-CM | POA: Insufficient documentation

## 2013-03-28 DIAGNOSIS — F411 Generalized anxiety disorder: Secondary | ICD-10-CM | POA: Insufficient documentation

## 2013-03-28 LAB — VITAMIN D 25 HYDROXY (VIT D DEFICIENCY, FRACTURES): Vit D, 25-Hydroxy: 36 ng/mL (ref 30–89)

## 2013-03-28 MED ORDER — PAROXETINE HCL 10 MG PO TABS: 10.0000 mg | ORAL_TABLET | Freq: Every day | ORAL | Status: DC

## 2013-03-28 MED ORDER — ALPRAZOLAM 0.25 MG PO TABS: 0.2500 mg | ORAL_TABLET | Freq: Two times a day (BID) | ORAL | Status: DC | PRN

## 2013-03-28 NOTE — Assessment & Plan Note (Addendum)
Recurrent, with no evidence of orthostasis, anemia, thyroid dysfunction, or sleep apnea .   Patient was referred to Frances Kent for cardiologic evaluation and Holter monitor,  Stress test and ECHO were done  Results not available at current time. Patient states that nothing was found

## 2013-03-28 NOTE — Assessment & Plan Note (Signed)
Her initial grief reaction is now become generalized anxiety. Cautioned her against escalating her use of alprazolam to 3 times a day given its addictive potential and risk for seizures if she were withdrawn from it we discussed adding Paxil is a daily medication she is willing to give this a try. We'll start 10 mg and titrate as needed.

## 2013-03-28 NOTE — Progress Notes (Addendum)
Patient ID: Frances Kent, female   DOB: 1930-06-06, 80 y.o.   MRN: [id number]  The patient is here for annual Medicare wellness examination and management of other chronic and acute problems.  she's had increased anxiety since her husband died in 12/16/22. She had 2 ER visits in December for panic attacks and was referred to Dr. [name] for cardiology evaluation. Per patient her stress test was normal. She was noted to have multiple mitral valve prolapse. She has been using alprazolam twice daily but feels shaky without it and is requesting an increase in use.   The risk factors are reflected in the social history.  The roster of all physicians providing medical care to patient - is listed in the Snapshot section of the chart.  Activities of daily living:  The patient is 100% independent in all ADLs: dressing, toileting, feeding as well as independent mobility  Home safety : The patient has smoke detectors in the home. They wear seatbelts.  There are no firearms at home. There is no violence in the home.   There is no risks for hepatitis, STDs or HIV. There is no   history of blood transfusion. They have no travel history to infectious disease endemic areas of the world.  The patient has seen their dentist in the last six month. They have seen their eye doctor in the last year. They admit to slight hearing difficulty with regard to whispered voices and some television programs.  They have deferred audiologic testing in the last year.  They do not  have excessive sun exposure. Discussed the need for sun protection: hats, long sleeves and use of sunscreen if there is significant sun exposure.   Diet: the importance of a healthy diet is discussed. They do have a healthy diet.  The benefits of regular aerobic exercise were discussed. She walks 4 times per week ,  20 minutes.   Depression screen: there are no signs or vegative symptoms of depression- irritability, change in appetite, anhedonia,  sadness/tearfullness.  Cognitive assessment: the patient manages all their financial and personal affairs and is actively engaged. They could relate day,date,year and events; recalled 2/3 objects at 3 minutes; performed clock-face test normally.  The following portions of the patient's history were reviewed and updated as appropriate: allergies, current medications, past family history, past medical history,  past surgical history, past social history  and problem list.  Visual acuity was not assessed per patient preference since she has regular follow up with her ophthalmologist. Hearing and body mass index were assessed and reviewed.   During the course of the visit the patient was educated and counseled about appropriate screening and preventive services including : fall prevention , diabetes screening, nutrition counseling, colorectal cancer screening, and recommended immunizations.    Objective:   BP 138/62  Pulse 68  Temp(Src) 97.6 F (36.4 C) (Oral)  Resp 16  Ht 5' 2.75" (1.594 m)  Wt 135 lb 4 oz (61.349 kg)  BMI 24.15 kg/m2  SpO2 98%  General Appearance:    Alert, cooperative, no distress, appears stated age  Head:    Normocephalic, without obvious abnormality, atraumatic  Eyes:    PERRL, conjunctiva/corneas clear, EOM's intact, fundi    benign, both eyes  Ears:    Normal TM's and external ear canals, both ears  Nose:   Nares normal, septum midline, mucosa normal, no drainage    or sinus tenderness  Throat:   Lips, mucosa, and tongue normal; teeth and gums normal  Neck:   Supple, symmetrical, trachea midline, no adenopathy;    thyroid:  no enlargement/tenderness/nodules; no carotid   bruit or JVD  Back:     Symmetric, no curvature, ROM normal, no CVA tenderness  Lungs:     Clear to auscultation bilaterally, respirations unlabored  Chest Wall:    No tenderness or deformity   Heart:    Regular rate and rhythm, S1 and S2 normal, no murmur, rub   or gallop  Breast Exam:     No tenderness, masses, or nipple abnormality  Abdomen:     Soft, non-tender, bowel sounds active all four quadrants,    no masses, no organomegaly  Extremities:   Extremities normal, atraumatic, no cyanosis or edema  Pulses:   2+ and symmetric all extremities  Skin:   Skin color, texture, turgor normal, no rashes or lesions  Lymph nodes:   Cervical, supraclavicular, and axillary nodes normal  Neurologic:   CNII-XII intact, normal strength, sensation and reflexes    Throughout.  MMSE:  28/30    Assessment and Plan:  Routine general medical examination at a health care facility Annual comprehensive exam was done including breast, excluding pelvic and PAP smear. All screenings have been addressed .   Near syncope Recurrent, with no evidence of orthostasis, anemia, thyroid dysfunction, or sleep apnea .   Patient was referred to Sarina Ill for cardiologic evaluation and Holter monitor,  Stress test and ECHO were done  Results not available at current time. Patient states that nothing was found   Hyperlipidemia LDL goal <160 Untreated due to patient preference.  Lab Results  Component Value Date   CHOL 282* 03/27/2013   HDL 81.10 03/27/2013   LDLDIRECT 185.3 03/27/2013   TRIG 95.0 03/27/2013   CHOLHDL 3 03/27/2013      Screening for breast cancer Normal exam,  Discussed when to stop screening,  She would like to continue.   Generalized anxiety disorder Her initial grief reaction is now become generalized anxiety. Cautioned her against escalating her use of alprazolam to 3 times a day given its addictive potential and risk for seizures if she were withdrawn from it we discussed adding Paxil is a daily medication she is willing to give this a try. We'll start 10 mg and titrate as needed.  Mitral valve prolapse Or syncopal episodes. Records have not been received from [place].   Updated Medication List Outpatient Encounter Prescriptions as of 03/27/2013  Medication Sig  .  ALPRAZolam (XANAX) 0.25 MG tablet Take 1 tablet (0.25 mg total) by mouth 2 (two) times daily as needed for anxiety.  Marland Kitchen aspirin 81 MG tablet Take 81 mg by mouth 2 (two) times a week.  Marland Kitchen b complex vitamins tablet Take 1 tablet by mouth daily.    . calcium-vitamin D (OSCAL WITH D) 250-125 MG-UNIT per tablet Take 1 tablet by mouth daily.    . pantoprazole (PROTONIX) 40 MG tablet Take 1 tablet (40 mg total) by mouth daily.  . [DISCONTINUED] ALPRAZolam (XANAX) 0.25 MG tablet Take 1 tablet (0.25 mg total) by mouth 2 (two) times daily as needed for anxiety.  Marland Kitchen PARoxetine (PAXIL) 10 MG tablet Take 1 tablet (10 mg total) by mouth daily.

## 2013-03-28 NOTE — Assessment & Plan Note (Signed)
Normal exam,  Discussed when to stop screening,  She would like to continue.

## 2013-03-28 NOTE — Assessment & Plan Note (Signed)
Annual comprehensive exam was done including breast, excluding pelvic and PAP smear. All screenings have been addressed .  

## 2013-03-28 NOTE — Assessment & Plan Note (Signed)
Or syncopal episodes. Records have not been received from [place].

## 2013-03-28 NOTE — Assessment & Plan Note (Signed)
Untreated due to patient preference.  Lab Results  Component Value Date   CHOL 282* 03/27/2013   HDL 81.10 03/27/2013   LDLDIRECT 185.3 03/27/2013   TRIG 95.0 03/27/2013   CHOLHDL 3 03/27/2013

## 2013-03-28 NOTE — Addendum Note (Signed)
Addended by: [name] on: 03/28/2013 07:38 PM   Modules accepted: Orders, Level of Service

## 2013-04-01 MED ORDER — PANTOPRAZOLE SODIUM 40 MG PO TBEC: 40.0000 mg | DELAYED_RELEASE_TABLET | Freq: Every day | ORAL | Status: DC

## 2013-04-01 NOTE — Addendum Note (Signed)
Addended by: Nanci Pina on: 04/01/2013 01:46 PM   Modules accepted: Orders

## 2013-07-23 ENCOUNTER — Telehealth: Payer: Self-pay | Admitting: Internal Medicine

## 2013-07-23 DIAGNOSIS — H3530 Unspecified macular degeneration: Secondary | ICD-10-CM

## 2013-07-23 NOTE — Telephone Encounter (Signed)
Pt needs referral authorized for [facility], Dr. Starling Manns.

## 2013-07-23 NOTE — Telephone Encounter (Signed)
Please advise 

## 2013-10-12 ENCOUNTER — Other Ambulatory Visit: Payer: Self-pay | Admitting: Internal Medicine

## 2013-11-20 ENCOUNTER — Other Ambulatory Visit: Payer: Self-pay | Admitting: Internal Medicine

## 2013-12-18 ENCOUNTER — Ambulatory Visit (INDEPENDENT_AMBULATORY_CARE_PROVIDER_SITE_OTHER): Payer: Medicare HMO | Admitting: Internal Medicine

## 2013-12-18 ENCOUNTER — Encounter: Payer: Self-pay | Admitting: Internal Medicine

## 2013-12-18 VITALS — BP 138/70 | HR 56 | Temp 97.7°F | Resp 16 | Ht 62.75 in | Wt 137.5 lb

## 2013-12-18 DIAGNOSIS — F411 Generalized anxiety disorder: Secondary | ICD-10-CM

## 2013-12-18 DIAGNOSIS — L989 Disorder of the skin and subcutaneous tissue, unspecified: Secondary | ICD-10-CM

## 2013-12-18 DIAGNOSIS — D492 Neoplasm of unspecified behavior of bone, soft tissue, and skin: Secondary | ICD-10-CM

## 2013-12-18 DIAGNOSIS — D485 Neoplasm of uncertain behavior of skin: Secondary | ICD-10-CM

## 2013-12-18 NOTE — Progress Notes (Signed)
Patient ID: Frances Kent, female   DOB: [birth year], 80 y.o.   MRN: [id number]   Patient Active Problem List   Diagnosis Date Noted  . Skin lesion of face 12/21/2013  . Achilles tendinosis 12/21/2013  . Generalized anxiety disorder 03/28/2013  . Mitral valve prolapse 03/28/2013  . Routine general medical examination at a health care facility 03/23/2012  . IPF (idiopathic pulmonary fibrosis)   . Screening for breast cancer 11/29/2010  . Macular degeneration, left eye 11/29/2010  . Screening for colon cancer 11/29/2010  . Hyperlipidemia LDL goal <160 11/29/2010  . GERD (gastroesophageal reflux disease)     Subjective:  CC:   Chief Complaint  Patient presents with  . Acute Visit    Mole on left ear. Does not bother patient hair dresser informed patient was there.    HPI:   Frances Kent is a 80 y.o. female who presents for follow up on chronic anxiety and new issues.   She has a new skin lesion on helix of left ear , noticed by her hair dresser last month.  The spot is asymptomatic, has not bled and did not occur as a burn from a curling iron.   2) Has a bothersome knot on the left heel,  Has been present for months.  No history of trauma. Denies pain with walking or stretching. No history of bruising or discoloration    Past Medical History  Diagnosis Date  . GERD (gastroesophageal reflux disease)   . IPF (idiopathic pulmonary fibrosis) 20111    by CT, PFTS done by Humphrey Rolls    Past Surgical History  Procedure Laterality Date  . Appendectomy  1960  . Cholecystectomy  1985       The following portions of the patient's history were reviewed and updated as appropriate: Allergies, current medications, and problem list.    Review of Systems:   Patient denies headache, fevers, malaise, unintentional weight loss, skin rash, eye pain, sinus congestion and sinus pain, sore throat, dysphagia,  hemoptysis , cough, dyspnea, wheezing, chest pain, palpitations, orthopnea,  edema, abdominal pain, nausea, melena, diarrhea, constipation, flank pain, dysuria, hematuria, urinary  Frequency, nocturia, numbness, tingling, seizures,  Focal weakness, Loss of consciousness,  Tremor, insomnia, depression, anxiety, and suicidal ideation.     History   Social History  . Marital Status: Widowed    Spouse Name: N/A    Number of Children: N/A  . Years of Education: N/A   Occupational History  . Not on file.   Social History Main Topics  . Smoking status: Never Smoker   . Smokeless tobacco: Never Used     Comment: 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 , worked at [employer], [employer]  . Alcohol Use: No  . Drug Use: No  . Sexual Activity: Not on file   Other Topics Concern  . Not on file   Social History Narrative   Has caretaking responsibility for 3 young grandchildren who live with her    Objective:  Filed Vitals:   12/18/13 1022  BP: 138/70  Pulse: 56  Temp: 97.7 F (36.5 C)  Resp: 16     General appearance: alert, cooperative and appears stated age Ears: 3 mm hyperpigmented papular lesion on helix of left ear .  Throat: lips, mucosa, and tongue normal; teeth and gums normal Neck: no adenopathy, no carotid bruit, supple, symmetrical, trachea midline and thyroid not enlarged, symmetric, no tenderness/mass/nodules Back: symmetric, no curvature. ROM normal. No CVA tenderness. Lungs: clear to auscultation bilaterally Heart: regular  rate and rhythm, S1, S2 normal, no murmur, click, rub or gallop Pulses: 2+ and symmetric MSK: left heel with mild asymptomatic swelling. No discoloration , no mass.   Assessment and Plan:  Generalized anxiety disorder Managed with infrequent use of alprazolam.  She did not tolerate trial of paxil.   Skin lesion of face She has a hyperpigmented papular lesion on her left helix that would appear to be a nevus; however it is of recent development, has an irregular border and uneven pigmentation. . Referral to [facility] for  evaluation     Updated Medication List Outpatient Encounter Prescriptions as of 12/18/2013  Medication Sig  . ALPRAZolam (XANAX) 0.25 MG tablet Take 1 tablet (0.25 mg total) by mouth 2 (two) times daily as needed for anxiety.  Marland Kitchen aspirin 81 MG tablet Take 81 mg by mouth 2 (two) times a week.  Marland Kitchen b complex vitamins tablet Take 1 tablet by mouth daily.    . calcium-vitamin D (OSCAL WITH D) 250-125 MG-UNIT per tablet Take 1 tablet by mouth daily.    . pantoprazole (PROTONIX) 40 MG tablet TAKE ONE TABLET BY MOUTH ONCE DAILY  . [DISCONTINUED] PARoxetine (PAXIL) 10 MG tablet Take 1 tablet (10 mg total) by mouth daily.     Orders Placed This Encounter  Procedures  . Ambulatory referral to Dermatology    No Follow-up on file.

## 2013-12-18 NOTE — Assessment & Plan Note (Addendum)
Managed with infrequent use of alprazolam.  She did not tolerate trial of paxil.

## 2013-12-18 NOTE — Progress Notes (Signed)
Pre-visit discussion using our clinic review tool. No additional management support is needed unless otherwise documented below in the visit note.  

## 2013-12-21 ENCOUNTER — Encounter: Payer: Self-pay | Admitting: Internal Medicine

## 2013-12-21 DIAGNOSIS — L989 Disorder of the skin and subcutaneous tissue, unspecified: Secondary | ICD-10-CM | POA: Insufficient documentation

## 2013-12-21 DIAGNOSIS — M6788 Other specified disorders of synovium and tendon, other site: Secondary | ICD-10-CM | POA: Insufficient documentation

## 2013-12-21 NOTE — Assessment & Plan Note (Signed)
She has a hyperpigmented papular lesion on her left helix that would appear to be a nevus; however it is of recent development, has an irregular border and uneven pigmentation. . Referral to [facility] for evaluation

## 2014-03-17 DIAGNOSIS — I341 Nonrheumatic mitral (valve) prolapse: Secondary | ICD-10-CM | POA: Diagnosis not present

## 2014-03-17 DIAGNOSIS — E782 Mixed hyperlipidemia: Secondary | ICD-10-CM | POA: Diagnosis not present

## 2014-03-17 DIAGNOSIS — R002 Palpitations: Secondary | ICD-10-CM | POA: Diagnosis not present

## 2014-03-17 DIAGNOSIS — K219 Gastro-esophageal reflux disease without esophagitis: Secondary | ICD-10-CM | POA: Diagnosis not present

## 2014-03-22 ENCOUNTER — Emergency Department: Payer: Self-pay | Admitting: Emergency Medicine

## 2014-03-22 DIAGNOSIS — Z79899 Other long term (current) drug therapy: Secondary | ICD-10-CM | POA: Diagnosis not present

## 2014-03-22 DIAGNOSIS — R002 Palpitations: Secondary | ICD-10-CM | POA: Diagnosis not present

## 2014-03-22 DIAGNOSIS — I4891 Unspecified atrial fibrillation: Secondary | ICD-10-CM | POA: Diagnosis not present

## 2014-03-22 LAB — BASIC METABOLIC PANEL
ANION GAP: 7 (ref 7–16)
BUN: 16 mg/dL (ref 4–21)
BUN: 16 mg/dL (ref 7–18)
CALCIUM: 8.3 mg/dL — AB (ref 8.5–10.1)
CHLORIDE: 107 mmol/L (ref 98–107)
CREATININE: 0.9 mg/dL (ref 0.5–1.1)
Co2: 25 mmol/L (ref 21–32)
Creatinine: 0.9 mg/dL (ref 0.60–1.30)
EGFR (Non-African Amer.): >60
GLUCOSE: 90 mg/dL
Glucose: 90 mg/dL (ref 65–99)
OSMOLALITY: 278 (ref 275–301)
Potassium: 4.2 mmol/L (ref 3.4–5.3)
Potassium: 4.2 mmol/L (ref 3.5–5.1)
Sodium: 139 mmol/L (ref 136–145)
Sodium: 139 mmol/L (ref 137–147)

## 2014-03-22 LAB — CBC AND DIFFERENTIAL
HEMATOCRIT: 44 % (ref 36–46)
Hemoglobin: 14.3 g/dL (ref 12.0–16.0)
Platelets: 237 10*3/uL (ref 150–399)
WBC: 8.6 10^3/mL

## 2014-03-22 LAB — CBC
HCT: 44 % (ref 35.0–47.0)
HGB: 14.3 g/dL (ref 12.0–16.0)
MCH: 29.6 pg (ref 26.0–34.0)
MCHC: 32.6 g/dL (ref 32.0–36.0)
MCV: 91 fL (ref 80–100)
Platelet: 237 10*3/uL (ref 150–440)
RBC: 4.84 10*6/uL (ref 3.80–5.20)
RDW: 14.8 % — ABNORMAL HIGH (ref 11.5–14.5)
WBC: 8.6 10*3/uL (ref 3.6–11.0)

## 2014-03-22 LAB — TROPONIN I: Troponin-I: 0.03 ng/mL

## 2014-03-24 DIAGNOSIS — R002 Palpitations: Secondary | ICD-10-CM | POA: Diagnosis not present

## 2014-03-24 DIAGNOSIS — I480 Paroxysmal atrial fibrillation: Secondary | ICD-10-CM | POA: Diagnosis not present

## 2014-03-24 DIAGNOSIS — K219 Gastro-esophageal reflux disease without esophagitis: Secondary | ICD-10-CM | POA: Diagnosis not present

## 2014-03-24 DIAGNOSIS — I341 Nonrheumatic mitral (valve) prolapse: Secondary | ICD-10-CM | POA: Diagnosis not present

## 2014-03-24 DIAGNOSIS — I471 Supraventricular tachycardia: Secondary | ICD-10-CM | POA: Diagnosis not present

## 2014-03-31 DIAGNOSIS — R002 Palpitations: Secondary | ICD-10-CM | POA: Diagnosis not present

## 2014-04-02 ENCOUNTER — Encounter: Payer: Self-pay | Admitting: Internal Medicine

## 2014-04-02 ENCOUNTER — Ambulatory Visit (INDEPENDENT_AMBULATORY_CARE_PROVIDER_SITE_OTHER): Payer: Commercial Managed Care - HMO | Admitting: Internal Medicine

## 2014-04-02 VITALS — BP 132/62 | HR 61 | Temp 97.5°F | Resp 14 | Ht 62.5 in | Wt 138.5 lb

## 2014-04-02 DIAGNOSIS — Z0000 Encounter for general adult medical examination without abnormal findings: Secondary | ICD-10-CM | POA: Diagnosis not present

## 2014-04-02 DIAGNOSIS — E785 Hyperlipidemia, unspecified: Secondary | ICD-10-CM

## 2014-04-02 DIAGNOSIS — Z1211 Encounter for screening for malignant neoplasm of colon: Secondary | ICD-10-CM | POA: Diagnosis not present

## 2014-04-02 DIAGNOSIS — F411 Generalized anxiety disorder: Secondary | ICD-10-CM | POA: Diagnosis not present

## 2014-04-02 DIAGNOSIS — I4891 Unspecified atrial fibrillation: Secondary | ICD-10-CM | POA: Diagnosis not present

## 2014-04-02 DIAGNOSIS — Z1239 Encounter for other screening for malignant neoplasm of breast: Secondary | ICD-10-CM | POA: Diagnosis not present

## 2014-04-02 DIAGNOSIS — I480 Paroxysmal atrial fibrillation: Secondary | ICD-10-CM | POA: Diagnosis not present

## 2014-04-02 LAB — HEPATIC FUNCTION PANEL
ALT: 13 U/L (ref 0–35)
AST: 17 U/L (ref 0–37)
Albumin: 4.3 g/dL (ref 3.5–5.2)
Alkaline Phosphatase: 77 U/L (ref 39–117)
Bilirubin, Direct: 0.1 mg/dL (ref 0.0–0.3)
Total Bilirubin: 0.6 mg/dL (ref 0.2–1.2)
Total Protein: 7.1 g/dL (ref 6.0–8.3)

## 2014-04-02 LAB — MAGNESIUM: Magnesium: 2.2 mg/dL (ref 1.5–2.5)

## 2014-04-02 LAB — LIPID PANEL
CHOL/HDL RATIO: 3
Cholesterol: 274 mg/dL — ABNORMAL HIGH (ref 0–200)
HDL: 79.9 mg/dL (ref >39.00)
LDL CALC: 171 mg/dL — AB (ref 0–99)
NonHDL: 194.1
TRIGLYCERIDES: 114 mg/dL (ref 0.0–149.0)
VLDL: 22.8 mg/dL (ref 0.0–40.0)

## 2014-04-02 MED ORDER — PANTOPRAZOLE SODIUM 40 MG PO TBEC: 40.0000 mg | DELAYED_RELEASE_TABLET | Freq: Every day | ORAL | Status: DC

## 2014-04-02 MED ORDER — ALPRAZOLAM 0.25 MG PO TABS: 0.2500 mg | ORAL_TABLET | Freq: Two times a day (BID) | ORAL | Status: DC | PRN

## 2014-04-02 NOTE — Progress Notes (Signed)
Patient ID: Frances Kent, female   DOB: 04/02/1930, 81 y.o.   MRN: [id number] The patient is here for annual Medicare wellness examination and management of other chronic and acute problems.  She is very pale appearing today and appears tired.   Cc:  Had sudden onset of irregular rhythm last weekend  that occurred after running up and down the stairs a few times while in a hurry to leave the house. Symptoms were accompanied by a feeling of impendid death,  Drove herself to Urgent Care,  Was sent to ER for new onset atrial fib,  Rate 129,  TSH not checked,  Given IV cardizem and discharged from ED with rx for Cartia XT 120 mg daily which she never started it .  Troponin was negative x 1 .     Sice then has been having recurrent episodes of feeling  light headed has been taking alprazolam that I had given her  earlier in the year but never used.   Attributes the  death of granddaughter's fiance this Christmas, and the death of her brother in law earlier this month as emotional stressors causing anxiety attacks    Going to see [name] tomorrow for ECHO and stress test.  Wore a 24 Holter monitor 4 days ago, and has not heard the results ye      The risk factors are reflected in the social history.  The roster of all physicians providing medical care to patient - is listed in the Snapshot section of the chart.  Activities of daily living:  The patient is 100% independent in all ADLs: dressing, toileting, feeding as well as independent mobility  Home safety : The patient has smoke detectors in the home. They wear seatbelts.  There are no firearms at home. There is no violence in the home.   There is no risks for hepatitis, STDs or HIV. There is no   history of blood transfusion. They have no travel history to infectious disease endemic areas of the world.  The patient has seen their dentist in the last six month. They have seen their eye doctor in the last year. They admit to slight hearing  difficulty with regard to whispered voices and some television programs.  They have deferred audiologic testing in the last year.  They do not  have excessive sun exposure. Discussed the need for sun protection: hats, long sleeves and use of sunscreen if there is significant sun exposure.   Diet: the importance of a healthy diet is discussed. She has  a healthy diet and her weight is stable and Body mass index is 24.91 kg/(m^2).   The benefits of regular aerobic exercise were discussed. She walks 4 times per week ,  20 minutes.   Depression screen: there are  signs of anxiety AND GRIEF BUT NOT OF DEPRESSION   Cognitive assessment: the patient manages all their financial and personal affairs and is actively engaged. They could relate day,date,year and events; recalled 2/3 objects at 3 minutes; performed clock-face test normally.  The following portions of the patient's history were reviewed and updated as appropriate: allergies, current medications, past family history, past medical history,  past surgical history, past social history  and problem list.  Visual acuity was not assessed per patient preference since she has regular follow up with her ophthalmologist. Hearing and body mass index were assessed and reviewed.   During the course of the visit the patient was educated and counseled about appropriate screening and preventive  services including : fall prevention , diabetes screening, nutrition counseling, colorectal cancer screening, and recommended immunizations.    Review of Symptoms:  Patient denies headache, fevers, malaise, unintentional weight loss, skin rash, eye pain, sinus congestion and sinus pain, sore throat, dysphagia,  hemoptysis , cough, dyspnea, wheezing, chest pain, , orthopnea, edema, abdominal pain, nausea, melena, diarrhea, constipation, flank pain, dysuria, hematuria, urinary  Frequency, nocturia, numbness, tingling, seizures,  Focal weakness, Loss of consciousness,   Tremor, insomnia, depression,, and suicidal ideation.    Objective:  BP 132/62 mmHg  Pulse 61  Temp(Src) 97.5 F (36.4 C) (Oral)  Resp 14  Ht 5' 2.5" (1.588 m)  Wt 138 lb 8 oz (62.823 kg)  BMI 24.91 kg/m2  SpO2 98%   General appearance: alert, cooperative and appears stated age,  Pale, tired appearing  Head: Normocephalic, without obvious abnormality, atraumatic Eyes: conjunctivae/corneas clear. PERRL, EOM's intact. Fundi benign. Ears: normal TM's and external ear canals both ears Nose: Nares normal. Septum midline. Mucosa normal. No drainage or sinus tenderness. Throat: lips, mucosa, and tongue normal; teeth and gums normal Neck: no adenopathy, no carotid bruit, no JVD, supple, symmetrical, trachea midline and thyroid not enlarged, symmetric, no tenderness/mass/nodules Lungs: clear to auscultation bilaterally Breasts: normal appearance, no masses or tenderness Heart: regular rate and rhythm, S1, S2 normal, no murmur, click, rub or gallop Abdomen: soft, non-tender; bowel sounds normal; no masses,  no organomegaly Extremities: extremities normal, atraumatic, no cyanosis or edema Pulses: 2+ and symmetric Skin: Skin color, texture, turgor normal. No rashes or lesions Neurologic: Alert and oriented X 3, normal strength and tone. Normal symmetric reflexes. Normal coordination and gait.   Assessment and Plan:   Problem List Items Addressed This Visit    Screening for colon cancer    Given her age she will continue annual FOBTs.       PAF (paroxysmal atrial fibrillation)    For full cardiology evaluation in AM.  TSH and Mg were checked by me today and normal  Since ER did not check. Mg was 2.2 today.  Advised patient NOT to start Cartia given her resting HR of 65 today and normal BP.   Would benefit from short acting diltiazem, for prn use, but will defer to Dr Erin Fulling.  Lab Results  Component Value Date   NA 139 03/22/2014   K 4.2 03/22/2014   CL 105 03/27/2013   CO2 28  03/27/2013   Lab Results  Component Value Date   TSH 2.500 04/02/2014         Medicare annual wellness visit, subsequent    Annual Medicare wellness  exam was done as well as a comprehensive physical exam and management of acute and chronic conditions .  During the course of the visit the patient was educated and counseled about appropriate screening and preventive services including : fall prevention , diabetes screening, nutrition counseling, colorectal cancer screening, and recommended immunizations.  Printed recommendations for health maintenance screenings was given.       Hyperlipidemia LDL goal <160    Untreated due to patient preference.  Fasting lipids checked today are unchanged.  Continue baby aspirin daily,  Decision to anticoagulate for PAF will be deferred to cardiology   Lab Results  Component Value Date   CHOL 274* 04/02/2014   HDL 79.90 04/02/2014   LDLCALC 171* 04/02/2014   LDLDIRECT 185.3 03/27/2013   TRIG 114.0 04/02/2014   CHOLHDL 3 04/02/2014   Lab Results  Component Value Date   ALT 13  04/02/2014   AST 17 04/02/2014   ALKPHOS 77 04/02/2014   BILITOT 0.6 04/02/2014         Generalized anxiety disorder    Discussed with patient the improbably cause of PAF to be her anxiety, but certainly that runs of atrial fib may trigger panic attacks.  Has prn alprazolam .  Will avoid SSRI given risk of arrhythmias with SSRIs.        Other Visit Diagnoses    Hyperlipidemia    -  Primary    Relevant Orders    Hepatic function panel (Completed)    Lipid panel (Completed)    Paroxysmal atrial fibrillation        Relevant Orders    Magnesium (Completed)    T4 AND TSH (Completed)    Breast cancer screening        Relevant Orders    MM DIGITAL SCREENING BILATERAL    Colon cancer screening        Relevant Orders    Fecal occult blood, imunochemical

## 2014-04-02 NOTE — Progress Notes (Signed)
Pre-visit discussion using our clinic review tool. No additional management support is needed unless otherwise documented below in the visit note.  

## 2014-04-02 NOTE — Patient Instructions (Signed)
Please tell Dr Erin Fulling that I checked your thyroid and magnesium level yesterday and advised you not to start daily cartia since your pulse was in the 60s and regular  You can use the nerve pill up to two times daily IF NEEDED  Health Maintenance Adopting a healthy lifestyle and getting preventive care can go a long way to promote health and wellness. Talk with your health care provider about what schedule of regular examinations is right for you. This is a good chance for you to check in with your provider about disease prevention and staying healthy. In between checkups, there are plenty of things you can do on your own. Experts have done a lot of research about which lifestyle changes and preventive measures are most likely to keep you healthy. Ask your health care provider for more information. WEIGHT AND DIET  Eat a healthy diet  Be sure to include plenty of vegetables, fruits, low-fat dairy products, and lean protein.  Do not eat a lot of foods high in solid fats, added sugars, or salt.  Get regular exercise. This is one of the most important things you can do for your health.  Most adults should exercise for at least 150 minutes each week. The exercise should increase your heart rate and make you sweat (moderate-intensity exercise).  Most adults should also do strengthening exercises at least twice a week. This is in addition to the moderate-intensity exercise.  Maintain a healthy weight  Body mass index (BMI) is a measurement that can be used to identify possible weight problems. It estimates body fat based on height and weight. Your health care provider can help determine your BMI and help you achieve or maintain a healthy weight.  For females 48 years of age and older:   A BMI below 18.5 is considered underweight.  A BMI of 18.5 to 24.9 is normal.  A BMI of 25 to 29.9 is considered overweight.  A BMI of 30 and above is considered obese.  Watch levels of cholesterol and  blood lipids  You should start having your blood tested for lipids and cholesterol at 79 years of age, then have this test every 5 years.  You may need to have your cholesterol levels checked more often if:  Your lipid or cholesterol levels are high.  You are older than 79 years of age.  You are at high risk for heart disease.  CANCER SCREENING   Lung Cancer  Lung cancer screening is recommended for adults 81-60 years old who are at high risk for lung cancer because of a history of smoking.  A yearly low-dose CT scan of the lungs is recommended for people who:  Currently smoke.  Have quit within the past 15 years.  Have at least a 30-pack-year history of smoking. A pack year is smoking an average of one pack of cigarettes a day for 1 year.  Yearly screening should continue until it has been 15 years since you quit.  Yearly screening should stop if you develop a health problem that would prevent you from having lung cancer treatment.  Breast Cancer  Practice breast self-awareness. This means understanding how your breasts normally appear and feel.  It also means doing regular breast self-exams. Let your health care provider know about any changes, no matter how small.  If you are in your 20s or 30s, you should have a clinical breast exam (CBE) by a health care provider every 1-3 years as part of a regular health  exam.  If you are 40 or older, have a CBE every year. Also consider having a breast X-ray (mammogram) every year.  If you have a family history of breast cancer, talk to your health care provider about genetic screening.  If you are at high risk for breast cancer, talk to your health care provider about having an MRI and a mammogram every year.  Breast cancer gene (BRCA) assessment is recommended for women who have family members with BRCA-related cancers. BRCA-related cancers include:  Breast.  Ovarian.  Tubal.  Peritoneal cancers.  Results of the  assessment will determine the need for genetic counseling and BRCA1 and BRCA2 testing. Cervical Cancer Routine pelvic examinations to screen for cervical cancer are no longer recommended for nonpregnant women who are considered low risk for cancer of the pelvic organs (ovaries, uterus, and vagina) and who do not have symptoms. A pelvic examination may be necessary if you have symptoms including those associated with pelvic infections. Ask your health care provider if a screening pelvic exam is right for you.   The Pap test is the screening test for cervical cancer for women who are considered at risk.  If you had a hysterectomy for a problem that was not cancer or a condition that could lead to cancer, then you no longer need Pap tests.  If you are older than 65 years, and you have had normal Pap tests for the past 10 years, you no longer need to have Pap tests.  If you have had past treatment for cervical cancer or a condition that could lead to cancer, you need Pap tests and screening for cancer for at least 20 years after your treatment.  If you no longer get a Pap test, assess your risk factors if they change (such as having a new sexual partner). This can affect whether you should start being screened again.  Some women have medical problems that increase their chance of getting cervical cancer. If this is the case for you, your health care provider may recommend more frequent screening and Pap tests.  The human papillomavirus (HPV) test is another test that may be used for cervical cancer screening. The HPV test looks for the virus that can cause cell changes in the cervix. The cells collected during the Pap test can be tested for HPV.  The HPV test can be used to screen women 72 years of age and older. Getting tested for HPV can extend the interval between normal Pap tests from three to five years.  An HPV test also should be used to screen women of any age who have unclear Pap test  results.  After 79 years of age, women should have HPV testing as often as Pap tests.  Colorectal Cancer  This type of cancer can be detected and often prevented.  Routine colorectal cancer screening usually begins at 79 years of age and continues through 79 years of age.  Your health care provider may recommend screening at an earlier age if you have risk factors for colon cancer.  Your health care provider may also recommend using home test kits to check for hidden blood in the stool.  A small camera at the end of a tube can be used to examine your colon directly (sigmoidoscopy or colonoscopy). This is done to check for the earliest forms of colorectal cancer.  Routine screening usually begins at age 9.  Direct examination of the colon should be repeated every 5-10 years through 79 years of  age. However, you may need to be screened more often if early forms of precancerous polyps or small growths are found. Skin Cancer  Check your skin from head to toe regularly.  Tell your health care provider about any new moles or changes in moles, especially if there is a change in a mole's shape or color.  Also tell your health care provider if you have a mole that is larger than the size of a pencil eraser.  Always use sunscreen. Apply sunscreen liberally and repeatedly throughout the day.  Protect yourself by wearing long sleeves, pants, a wide-brimmed hat, and sunglasses whenever you are outside. HEART DISEASE, DIABETES, AND HIGH BLOOD PRESSURE   Have your blood pressure checked at least every 1-2 years. High blood pressure causes heart disease and increases the risk of stroke.  If you are between 98 years and 20 years old, ask your health care provider if you should take aspirin to prevent strokes.  Have regular diabetes screenings. This involves taking a blood sample to check your fasting blood sugar level.  If you are at a normal weight and have a low risk for diabetes, have this  test once every three years after 79 years of age.  If you are overweight and have a high risk for diabetes, consider being tested at a younger age or more often. PREVENTING INFECTION  Hepatitis B  If you have a higher risk for hepatitis B, you should be screened for this virus. You are considered at high risk for hepatitis B if:  You were born in a country where hepatitis B is common. Ask your health care provider which countries are considered high risk.  Your parents were born in a high-risk country, and you have not been immunized against hepatitis B (hepatitis B vaccine).  You have HIV or AIDS.  You use needles to inject street drugs.  You live with someone who has hepatitis B.  You have had sex with someone who has hepatitis B.  You get hemodialysis treatment.  You take certain medicines for conditions, including cancer, organ transplantation, and autoimmune conditions. Hepatitis C  Blood testing is recommended for:  Everyone born from [birth year] through 1963.  Anyone with known risk factors for hepatitis C. Sexually transmitted infections (STIs)  You should be screened for sexually transmitted infections (STIs) including gonorrhea and chlamydia if:  You are sexually active and are younger than 79 years of age.  You are older than 79 years of age and your health care provider tells you that you are at risk for this type of infection.  Your sexual activity has changed since you were last screened and you are at an increased risk for chlamydia or gonorrhea. Ask your health care provider if you are at risk.  If you do not have HIV, but are at risk, it may be recommended that you take a prescription medicine daily to prevent HIV infection. This is called pre-exposure prophylaxis (PrEP). You are considered at risk if:  You are sexually active and do not regularly use condoms or know the HIV status of your partner(s).  You take drugs by injection.  You are sexually active with  a partner who has HIV. Talk with your health care provider about whether you are at high risk of being infected with HIV. If you choose to begin PrEP, you should first be tested for HIV. You should then be tested every 3 months for as long as you are taking PrEP.  PREGNANCY  If you are premenopausal and you may become pregnant, ask your health care provider about preconception counseling.  If you may become pregnant, take 400 to 800 micrograms (mcg) of folic acid every day.  If you want to prevent pregnancy, talk to your health care provider about birth control (contraception). OSTEOPOROSIS AND MENOPAUSE   Osteoporosis is a disease in which the bones lose minerals and strength with aging. This can result in serious bone fractures. Your risk for osteoporosis can be identified using a bone density scan.  If you are 65 years of age or older, or if you are at risk for osteoporosis and fractures, ask your health care provider if you should be screened.  Ask your health care provider whether you should take a calcium or vitamin D supplement to lower your risk for osteoporosis.  Menopause may have certain physical symptoms and risks.  Hormone replacement therapy may reduce some of these symptoms and risks. Talk to your health care provider about whether hormone replacement therapy is right for you.  HOME CARE INSTRUCTIONS   Schedule regular health, dental, and eye exams.  Stay current with your immunizations.   Do not use any tobacco products including cigarettes, chewing tobacco, or electronic cigarettes.  If you are pregnant, do not drink alcohol.  If you are breastfeeding, limit how much and how often you drink alcohol.  Limit alcohol intake to no more than 1 drink per day for nonpregnant women. One drink equals 12 ounces of beer, 5 ounces of wine, or 1 ounces of hard liquor.  Do not use street drugs.  Do not share needles.  Ask your health care provider for help if you need  support or information about quitting drugs.  Tell your health care provider if you often feel depressed.  Tell your health care provider if you have ever been abused or do not feel safe at home. Document Released: 08/16/2010 Document Revised: 06/17/2013 Document Reviewed: 01/02/2013 [employer] Patient Information 2015 [employer]. This information is not intended to replace advice given to you by your health care provider. Make sure you discuss any questions you have with your health care provider.  

## 2014-04-03 ENCOUNTER — Encounter: Payer: Self-pay | Admitting: Internal Medicine

## 2014-04-03 DIAGNOSIS — I4891 Unspecified atrial fibrillation: Secondary | ICD-10-CM | POA: Insufficient documentation

## 2014-04-03 DIAGNOSIS — I480 Paroxysmal atrial fibrillation: Secondary | ICD-10-CM | POA: Diagnosis not present

## 2014-04-03 DIAGNOSIS — R002 Palpitations: Secondary | ICD-10-CM | POA: Diagnosis not present

## 2014-04-03 DIAGNOSIS — I340 Nonrheumatic mitral (valve) insufficiency: Secondary | ICD-10-CM | POA: Diagnosis not present

## 2014-04-03 DIAGNOSIS — I050 Rheumatic mitral stenosis: Secondary | ICD-10-CM | POA: Diagnosis not present

## 2014-04-03 LAB — T4 AND TSH
T4 TOTAL: 9.8 ug/dL (ref 4.5–12.0)
TSH: 2.5 u[IU]/mL (ref 0.450–4.500)

## 2014-04-03 NOTE — Assessment & Plan Note (Signed)
Discussed with patient the improbably cause of PAF to be her anxiety, but certainly that runs of atrial fib may trigger panic attacks.  Has prn alprazolam .  Will avoid SSRI given risk of arrhythmias with SSRIs.

## 2014-04-03 NOTE — Assessment & Plan Note (Addendum)
For full cardiology evaluation in AM.  TSH and Mg were checked by me today and normal  Since ER did not check. Mg was 2.2 today.  Advised patient NOT to start Cartia given her resting HR of 65 today and normal BP.   Would benefit from short acting diltiazem, for prn use, but will defer to Dr Erin Fulling.  Lab Results  Component Value Date   NA 139 03/22/2014   K 4.2 03/22/2014   CL 105 03/27/2013   CO2 28 03/27/2013   Lab Results  Component Value Date   TSH 2.500 04/02/2014

## 2014-04-03 NOTE — Assessment & Plan Note (Signed)

## 2014-04-03 NOTE — Assessment & Plan Note (Addendum)
Untreated due to patient preference.  Fasting lipids checked today are unchanged.  Continue baby aspirin daily,  Decision to anticoagulate for PAF will be deferred to cardiology   Lab Results  Component Value Date   CHOL 274* 04/02/2014   HDL 79.90 04/02/2014   LDLCALC 171* 04/02/2014   LDLDIRECT 185.3 03/27/2013   TRIG 114.0 04/02/2014   CHOLHDL 3 04/02/2014   Lab Results  Component Value Date   ALT 13 04/02/2014   AST 17 04/02/2014   ALKPHOS 77 04/02/2014   BILITOT 0.6 04/02/2014

## 2014-04-03 NOTE — Assessment & Plan Note (Signed)
Given her age she will continue annual FOBTs.

## 2014-04-09 DIAGNOSIS — H3532 Exudative age-related macular degeneration: Secondary | ICD-10-CM | POA: Diagnosis not present

## 2014-04-17 ENCOUNTER — Ambulatory Visit: Payer: Self-pay | Admitting: Internal Medicine

## 2014-04-17 DIAGNOSIS — Z1231 Encounter for screening mammogram for malignant neoplasm of breast: Secondary | ICD-10-CM | POA: Diagnosis not present

## 2014-04-28 ENCOUNTER — Telehealth: Payer: Self-pay

## 2014-04-28 NOTE — Telephone Encounter (Signed)
The patient needs a referral for her appointment with Dr.Profilio at [facility].  Pt callback - [phone]

## 2014-05-02 DIAGNOSIS — Z961 Presence of intraocular lens: Secondary | ICD-10-CM | POA: Diagnosis not present

## 2014-05-23 ENCOUNTER — Encounter: Payer: Self-pay | Admitting: Internal Medicine

## 2014-06-16 DIAGNOSIS — H3532 Exudative age-related macular degeneration: Secondary | ICD-10-CM | POA: Diagnosis not present

## 2014-07-28 DIAGNOSIS — H3532 Exudative age-related macular degeneration: Secondary | ICD-10-CM | POA: Diagnosis not present

## 2014-09-17 DIAGNOSIS — I071 Rheumatic tricuspid insufficiency: Secondary | ICD-10-CM | POA: Insufficient documentation

## 2014-09-17 DIAGNOSIS — I341 Nonrheumatic mitral (valve) prolapse: Secondary | ICD-10-CM | POA: Diagnosis not present

## 2014-09-17 DIAGNOSIS — I272 Other secondary pulmonary hypertension: Secondary | ICD-10-CM | POA: Diagnosis not present

## 2014-09-17 DIAGNOSIS — E782 Mixed hyperlipidemia: Secondary | ICD-10-CM | POA: Diagnosis not present

## 2014-09-22 DIAGNOSIS — H3532 Exudative age-related macular degeneration: Secondary | ICD-10-CM | POA: Diagnosis not present

## 2014-10-01 ENCOUNTER — Ambulatory Visit (INDEPENDENT_AMBULATORY_CARE_PROVIDER_SITE_OTHER): Payer: Commercial Managed Care - HMO | Admitting: Internal Medicine

## 2014-10-01 ENCOUNTER — Encounter: Payer: Self-pay | Admitting: Internal Medicine

## 2014-10-01 VITALS — BP 130/68 | HR 64 | Temp 97.7°F | Resp 14 | Ht 63.0 in | Wt 135.8 lb

## 2014-10-01 DIAGNOSIS — G4700 Insomnia, unspecified: Secondary | ICD-10-CM

## 2014-10-01 DIAGNOSIS — K2100 Gastro-esophageal reflux disease with esophagitis, without bleeding: Secondary | ICD-10-CM

## 2014-10-01 DIAGNOSIS — E785 Hyperlipidemia, unspecified: Secondary | ICD-10-CM

## 2014-10-01 NOTE — Progress Notes (Signed)
Subjective:  Patient ID: Frances Kent, female    DOB: 27-Oct-1930  Age: 81 y.o. MRN: [id number]  CC: The primary encounter diagnosis was Gastroesophageal reflux disease with esophagitis. Diagnoses of Insomnia and Hyperlipidemia LDL goal <160 were also pertinent to this visit.  HPI Frances Kent presents for follow up  GAD.Marland Kitchenusing alprazolam only sparingly ,  Does not make her drowsy , just slightly more relaxed,  No panic atacks   Does  Cardiology recently ofr PAF .  No testing  Occasional fatigue.  Still IADLs  Does her own own housework,  Has two teenage great grandchildren and their mother staying with her .  Gets tired and takes a 15 minute nap daily  Patient  has been having some trouble sleeping.  Wakes up 2 to 3 times per night . Bedtime hygiene reviewed,   Does not drink caffeinated beverages after 3 PM.  Only voids  bladder once per night.  No snoring partner. Does not drink alcohol to excess.  Not exercising excessively in the evening. Patient does have a history of anxiety but does not lie awake worrying about issues that cannot be resolved. Does not take stimulants.   Wakes up at 5 am per habit  Appetite ok,  Eats junk food (has coffee and cookies for breakfast)   Occasional constipation  resovled with dietary changes  No edema     Outpatient Prescriptions Prior to Visit  Medication Sig Dispense Refill  . ALPRAZolam (XANAX) 0.25 MG tablet Take 1 tablet (0.25 mg total) by mouth 2 (two) times daily as needed for anxiety. 60 tablet 0  . aspirin 81 MG tablet Take 81 mg by mouth 2 (two) times a week.    Marland Kitchen b complex vitamins tablet Take 1 tablet by mouth daily.      . calcium-vitamin D (OSCAL WITH D) 250-125 MG-UNIT per tablet Take 1 tablet by mouth daily.      . pantoprazole (PROTONIX) 40 MG tablet Take 1 tablet (40 mg total) by mouth daily. 30 tablet 3   No facility-administered medications prior to visit.    Review of Systems;  Patient denies headache, fevers,  malaise, unintentional weight loss, skin rash, eye pain, sinus congestion and sinus pain, sore throat, dysphagia,  hemoptysis , cough, dyspnea, wheezing, chest pain, palpitations, orthopnea, edema, abdominal pain, nausea, melena, diarrhea, constipation, flank pain, dysuria, hematuria, urinary  Frequency, nocturia, numbness, tingling, seizures,  Focal weakness, Loss of consciousness,  Tremor, insomnia, depression, anxiety, and suicidal ideation.      Objective:  BP 130/68 mmHg  Pulse 64  Temp(Src) 97.7 F (36.5 C) (Oral)  Resp 14  Ht 5\' 3"  (1.6 m)  Wt 135 lb 12 oz (61.576 kg)  BMI 24.05 kg/m2  SpO2 98%  BP Readings from Last 3 Encounters:  10/01/14 130/68  04/02/14 132/62  12/18/13 138/70    Wt Readings from Last 3 Encounters:  10/01/14 135 lb 12 oz (61.576 kg)  04/02/14 138 lb 8 oz (62.823 kg)  12/18/13 137 lb 8 oz (62.37 kg)    General appearance: alert, cooperative and appears stated age Ears: normal TM's and external ear canals both ears Throat: lips, mucosa, and tongue normal; teeth and gums normal Neck: no adenopathy, no carotid bruit, supple, symmetrical, trachea midline and thyroid not enlarged, symmetric, no tenderness/mass/nodules Back: symmetric, no curvature. ROM normal. No CVA tenderness. Lungs: clear to auscultation bilaterally Heart: regular rate and rhythm, S1, S2 normal, no murmur, click, rub or gallop Abdomen: soft, non-tender;  bowel sounds normal; no masses,  no organomegaly Pulses: 2+ and symmetric Skin: Skin color, texture, turgor normal. No rashes or lesions Lymph nodes: Cervical, supraclavicular, and axillary nodes normal.  No results found for: HGBA1C  Lab Results  Component Value Date   CREATININE 0.90 03/22/2014   CREATININE 0.9 03/22/2014   CREATININE 0.8 03/27/2013    Lab Results  Component Value Date   WBC 8.6 03/22/2014   HGB 14.3 03/22/2014   HCT 44.0 03/22/2014   PLT 237 03/22/2014   GLUCOSE 90 03/22/2014   CHOL 274* 04/02/2014    TRIG 114.0 04/02/2014   HDL 79.90 04/02/2014   LDLDIRECT 185.3 03/27/2013   LDLCALC 171* 04/02/2014   ALT 13 04/02/2014   AST 17 04/02/2014   NA 139 03/22/2014   K 4.2 03/22/2014   CL 107 03/22/2014   CREATININE 0.90 03/22/2014   BUN 16 03/22/2014   CO2 25 03/22/2014   TSH 2.500 04/02/2014    No results found.  Assessment & Plan:   Problem List Items Addressed This Visit      Unprioritized   GERD (gastroesophageal reflux disease) - Primary    Managed with PPI since 2012 ER visit for severe esophagitis .       Hyperlipidemia LDL goal <160    Untreated due to patient preference.   Continue baby aspirin daily,  Decision to anticoagulate for PAF will be deferred to cardiology   Lab Results  Component Value Date   CHOL 274* 04/02/2014   HDL 79.90 04/02/2014   LDLCALC 171* 04/02/2014   LDLDIRECT 185.3 03/27/2013   TRIG 114.0 04/02/2014   CHOLHDL 3 04/02/2014   Lab Results  Component Value Date   ALT 13 04/02/2014   AST 17 04/02/2014   ALKPHOS 77 04/02/2014   BILITOT 0.6 04/02/2014           Insomnia    Advised to try melatonin         I am having Frances Kent maintain her b complex vitamins, calcium-vitamin D, aspirin, pantoprazole, and ALPRAZolam.  No orders of the defined types were placed in this encounter.    There are no discontinued medications.  Follow-up: Return in about 6 months (around 04/03/2015).   [name], MD

## 2014-10-01 NOTE — Progress Notes (Signed)
Pre-visit discussion using our clinic review tool. No additional management support is needed unless otherwise documented below in the visit note.  

## 2014-10-01 NOTE — Patient Instructions (Addendum)
You are doing well!!  For your insomnia:  You can try up to 5 mg melatonin for your insomnia  , available OTC   I will see you in 6 months    Insomnia Insomnia is frequent trouble falling and/or staying asleep. Insomnia can be a long term problem or a short term problem. Both are common. Insomnia can be a short term problem when the wakefulness is related to a certain stress or worry. Long term insomnia is often related to ongoing stress during waking hours and/or poor sleeping habits. Overtime, sleep deprivation itself can make the problem worse. Every little thing feels more severe because you are overtired and your ability to cope is decreased. CAUSES   Stress, anxiety, and depression.  Poor sleeping habits.  Distractions such as TV in the bedroom.  Naps close to bedtime.  Engaging in emotionally charged conversations before bed.  Technical reading before sleep.  Alcohol and other sedatives. They may make the problem worse. They can hurt normal sleep patterns and normal dream activity.  Stimulants such as caffeine for several hours prior to bedtime.  Pain syndromes and shortness of breath can cause insomnia.  Exercise late at night.  Changing time zones may cause sleeping problems (jet lag). It is sometimes helpful to have someone observe your sleeping patterns. They should look for periods of not breathing during the night (sleep apnea). They should also look to see how long those periods last. If you live alone or observers are uncertain, you can also be observed at a sleep clinic where your sleep patterns will be professionally monitored. Sleep apnea requires a checkup and treatment. Give your caregivers your medical history. Give your caregivers observations your family has made about your sleep.  SYMPTOMS   Not feeling rested in the morning.  Anxiety and restlessness at bedtime.  Difficulty falling and staying asleep. TREATMENT   Your caregiver may prescribe  treatment for an underlying medical disorders. Your caregiver can give advice or help if you are using alcohol or other drugs for self-medication. Treatment of underlying problems will usually eliminate insomnia problems.  Medications can be prescribed for short time use. They are generally not recommended for lengthy use.  Over-the-counter sleep medicines are not recommended for lengthy use. They can be habit forming.  You can promote easier sleeping by making lifestyle changes such as:  Using relaxation techniques that help with breathing and reduce muscle tension.  Exercising earlier in the day.  Changing your diet and the time of your last meal. No night time snacks.  Establish a regular time to go to bed.  Counseling can help with stressful problems and worry.  Soothing music and white noise may be helpful if there are background noises you cannot remove.  Stop tedious detailed work at least one hour before bedtime. HOME CARE INSTRUCTIONS   Keep a diary. Inform your caregiver about your progress. This includes any medication side effects. See your caregiver regularly. Take note of:  Times when you are asleep.  Times when you are awake during the night.  The quality of your sleep.  How you feel the next day. This information will help your caregiver care for you.  Get out of bed if you are still awake after 15 minutes. Read or do some quiet activity. Keep the lights down. Wait until you feel sleepy and go back to bed.  Keep regular sleeping and waking hours. Avoid naps.  Exercise regularly.  Avoid distractions at bedtime. Distractions include watching  television or engaging in any intense or detailed activity like attempting to balance the household checkbook.  Develop a bedtime ritual. Keep a familiar routine of bathing, brushing your teeth, climbing into bed at the same time each night, listening to soothing music. Routines increase the success of falling to sleep  faster.  Use relaxation techniques. This can be using breathing and muscle tension release routines. It can also include visualizing peaceful scenes. You can also help control troubling or intruding thoughts by keeping your mind occupied with boring or repetitive thoughts like the old concept of counting sheep. You can make it more creative like imagining planting one beautiful flower after another in your backyard garden.  During your day, work to eliminate stress. When this is not possible use some of the previous suggestions to help reduce the anxiety that accompanies stressful situations. MAKE SURE YOU:   Understand these instructions.  Will watch your condition.  Will get help right away if you are not doing well or get worse. Document Released: 01/29/2000 Document Revised: 04/25/2011 Document Reviewed: 02/28/2007 [facility] Patient Information 2015 [place], [place]. This information is not intended to replace advice given to you by your health care provider. Make sure you discuss any questions you have with your health care provider.

## 2014-10-01 NOTE — Assessment & Plan Note (Addendum)
Managed with PPI since 2012 ER visit for severe esophagitis .  

## 2014-10-04 DIAGNOSIS — G4700 Insomnia, unspecified: Secondary | ICD-10-CM | POA: Insufficient documentation

## 2014-10-04 NOTE — Assessment & Plan Note (Signed)
Untreated due to patient preference.   Continue baby aspirin daily,  Decision to anticoagulate for PAF will be deferred to cardiology   Lab Results  Component Value Date   CHOL 274* 04/02/2014   HDL 79.90 04/02/2014   LDLCALC 171* 04/02/2014   LDLDIRECT 185.3 03/27/2013   TRIG 114.0 04/02/2014   CHOLHDL 3 04/02/2014   Lab Results  Component Value Date   ALT 13 04/02/2014   AST 17 04/02/2014   ALKPHOS 77 04/02/2014   BILITOT 0.6 04/02/2014

## 2014-10-04 NOTE — Assessment & Plan Note (Signed)
Advised to try melatonin

## 2014-10-25 ENCOUNTER — Other Ambulatory Visit: Payer: Self-pay | Admitting: Internal Medicine

## 2014-12-01 ENCOUNTER — Emergency Department: Payer: Commercial Managed Care - HMO

## 2014-12-01 ENCOUNTER — Ambulatory Visit: Payer: Medicare Other | Admitting: Family Medicine

## 2014-12-01 ENCOUNTER — Telehealth: Payer: Self-pay | Admitting: Family Medicine

## 2014-12-01 ENCOUNTER — Emergency Department
Admission: EM | Admit: 2014-12-01 | Discharge: 2014-12-01 | Disposition: A | Discharge status: Home / Self Care | Payer: Commercial Managed Care - HMO | Attending: Emergency Medicine | Admitting: Emergency Medicine

## 2014-12-01 ENCOUNTER — Ambulatory Visit (INDEPENDENT_AMBULATORY_CARE_PROVIDER_SITE_OTHER): Payer: Commercial Managed Care - HMO | Admitting: Family Medicine

## 2014-12-01 ENCOUNTER — Encounter: Payer: Self-pay | Admitting: Family Medicine

## 2014-12-01 VITALS — BP 136/88 | HR 94 | Temp 98.0°F | Ht 63.0 in | Wt 134.8 lb

## 2014-12-01 DIAGNOSIS — M79604 Pain in right leg: Secondary | ICD-10-CM

## 2014-12-01 DIAGNOSIS — Z79899 Other long term (current) drug therapy: Secondary | ICD-10-CM | POA: Insufficient documentation

## 2014-12-01 DIAGNOSIS — M79661 Pain in right lower leg: Secondary | ICD-10-CM | POA: Diagnosis not present

## 2014-12-01 DIAGNOSIS — Z7982 Long term (current) use of aspirin: Secondary | ICD-10-CM | POA: Diagnosis not present

## 2014-12-01 DIAGNOSIS — R0602 Shortness of breath: Secondary | ICD-10-CM

## 2014-12-01 DIAGNOSIS — M25561 Pain in right knee: Secondary | ICD-10-CM | POA: Diagnosis not present

## 2014-12-01 LAB — COMPREHENSIVE METABOLIC PANEL
ALT: 15 U/L (ref 0–35)
AST: 15 U/L (ref 0–37)
Albumin: 4.2 g/dL (ref 3.5–5.2)
Alkaline Phosphatase: 88 U/L (ref 39–117)
BUN: 10 mg/dL (ref 6–23)
CO2: 30 meq/L (ref 19–32)
Calcium: 9.8 mg/dL (ref 8.4–10.5)
Chloride: 107 mEq/L (ref 96–112)
Creatinine, Ser: 0.85 mg/dL (ref 0.40–1.20)
GFR: 68.01 mL/min (ref >60.00)
Glucose, Bld: 111 mg/dL — ABNORMAL HIGH (ref 70–99)
POTASSIUM: 5.1 meq/L (ref 3.5–5.1)
Sodium: 143 mEq/L (ref 135–145)
Total Bilirubin: 0.5 mg/dL (ref 0.2–1.2)
Total Protein: 7.2 g/dL (ref 6.0–8.3)

## 2014-12-01 LAB — VITAMIN D 25 HYDROXY (VIT D DEFICIENCY, FRACTURES): VITD: 31.94 ng/mL (ref 30.00–100.00)

## 2014-12-01 LAB — D-DIMER, QUANTITATIVE (NOT AT ARMC): D DIMER QUANT: 2.27 ug{FEU}/mL — AB (ref 0.00–0.48)

## 2014-12-01 NOTE — ED Notes (Signed)
Pt c/o intermittent right lower leg pain since last week and was seen at PCP today that performed labs and was concerned for DVT and sent pt here for further eval,,

## 2014-12-01 NOTE — Assessment & Plan Note (Signed)
Patient with onset of pain this morning in the area between her ankle and knee. This includes the anterior portion of her lower leg and her calf. She has no abnormalities on exam. She is neurovascularly intact in her lower extremities. There is no calf swelling or cords to indicate DVT. She has no risk factors for DVT. Calves are equal in size making DVT highly unlikely. She has no injury to indicate a need for imaging of her lower extremities with x-rays. Differential for this issue includes electrolyte imbalance, vitamin D deficiency, neuropathy, MSK/arthritic issue, or DVT. Patient additionally had 2 episodes of minimal shortness of breath and a single brief episode of sensation of palpitations last week. She has a history of paroxysmal A. fib and has not had an issue with this in a number of months. EKG today is normal sinus rhythm. EKG is reassuring today with no apparent ischemic changes. She has no risk factors for PE and wells score of 0. Per review of records it does appear that she has had shortness of breath in the past related to mitral valve prolapse and pulmonary hypertension. She has no chest pain, shortness of breath, or palpitations at this time. Her lung exam was normal today and she has normal oxygen saturation making acute pulmonary process and PE an unlikely cause for her shortness of breath. Doubt this is related to DVT or PE given lack of risk factors and stable vital signs today, though we'll check d-dimer to rule out this out. If elevated had a discussion with patient that we may need to send her to the emergency room for CT scan of her chest. We'll check cmet and vitamin D to evaluate for other causes. If lab work returns negative we'll attempt to treat with anti-inflammatories. Given return precautions.

## 2014-12-01 NOTE — ED Provider Notes (Signed)
[facility] Emergency Department Provider Note   ____________________________________________  Time seen: 1815  I have reviewed the triage vital signs and the nursing notes.   HISTORY  Chief Complaint Leg Pain   History limited by: Not Limited   HPI Frances Kent is a 81 y.o. female who presents to the emergency department today at the request of her primary care physician's office because of an elevated d-dimer found on blood work today. The patient went to her primary care doctor because of concerns for right lower leg pain. She states that she has been having intermittent pain for the past couple of days. She has not noticed that the pain is related to any exertion or specific position of her leg. The patient denies any current shortness of breath or chest pain. She did state that she had a couple episodes of palpitations last week. These were very short-lived. Patient denies any bloody cough.   Past Medical History  Diagnosis Date  . GERD (gastroesophageal reflux disease)   . IPF (idiopathic pulmonary fibrosis) ([place]) 20111    by CT, PFTS done by [facility]    Patient Active Problem List   Diagnosis Date Noted  . Pain of right lower extremity 12/01/2014  . Insomnia 10/04/2014  . PAF (paroxysmal atrial fibrillation) ([place]) 04/03/2014  . Skin lesion of face 12/21/2013  . Achilles tendinosis 12/21/2013  . Generalized anxiety disorder 03/28/2013  . Mitral valve prolapse 03/28/2013  . Medicare annual wellness visit, subsequent 03/23/2012  . IPF (idiopathic pulmonary fibrosis) ([place])   . Screening for breast cancer 11/29/2010  . Macular degeneration, left eye 11/29/2010  . Screening for colon cancer 11/29/2010  . Hyperlipidemia LDL goal <160 11/29/2010  . GERD (gastroesophageal reflux disease)     Past Surgical History  Procedure Laterality Date  . Appendectomy  1960  . Cholecystectomy  1985    Current Outpatient Rx  Name  Route  Sig  Dispense   Refill  . acidophilus (RISAQUAD) CAPS capsule   Oral   Take 1 capsule by mouth daily.         Marland Kitchen ALPRAZolam (XANAX) 0.25 MG tablet   Oral   Take 1 tablet (0.25 mg total) by mouth 2 (two) times daily as needed for anxiety.   60 tablet   0   . aspirin EC 81 MG tablet   Oral   Take 81-162 mg by mouth as needed for mild pain (or heart health (pt will use 2-3 times weekly)).          . pantoprazole (PROTONIX) 40 MG tablet   Oral   Take 40 mg by mouth daily.           Allergies Review of patient's allergies indicates no known allergies.  Family History  Problem Relation Age of Onset  . Cancer Mother 42    breast cancer, lived to 40,   . Heart disease Maternal Grandmother 70    died of massive MI  . Cancer Son 43    pancreatic cancer  . Heart disease Son     CAD, Tobacco Abuse     Social History Social History  Substance Use Topics  . Smoking status: Never Smoker   . Smokeless tobacco: Never Used     Comment: 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 , worked at [employer], [employer]  . Alcohol Use: No    Review of Systems  Constitutional: Negative for fever. Cardiovascular: Negative for chest pain. Respiratory: Negative for shortness of breath. Gastrointestinal: Negative for  abdominal pain, vomiting and diarrhea. Genitourinary: Negative for dysuria. Musculoskeletal: Positive for right lower leg pain. Skin: Negative for rash. Neurological: Negative for headaches, focal weakness or numbness.   10-point ROS otherwise negative.  ____________________________________________   PHYSICAL EXAM:  VITAL SIGNS: ED Triage Vitals  Enc Vitals Group     BP 12/01/14 1629 169/75 mmHg     Pulse --      Resp 12/01/14 1629 18     Temp 12/01/14 1629 98.2 F (36.8 C)     Temp Source 12/01/14 1629 Oral     SpO2 12/01/14 1629 100 %     Weight 12/01/14 1629 134 lb (60.782 kg)     Height 12/01/14 1629 5\' 3"  (1.6 m)     Head Cir --      Peak Flow --      Pain Score 12/01/14 1630 10    Constitutional: Alert and oriented. Well appearing and in no distress. Eyes: Conjunctivae are normal. PERRL. Normal extraocular movements. ENT   Head: Normocephalic and atraumatic.   Nose: No congestion/rhinnorhea.   Mouth/Throat: Mucous membranes are moist.   Neck: No stridor. Hematological/Lymphatic/Immunilogical: No cervical lymphadenopathy. Cardiovascular: Normal rate, regular rhythm.  No murmurs, rubs, or gallops. Respiratory: Normal respiratory effort without tachypnea nor retractions. Breath sounds are clear and equal bilaterally. No wheezes/rales/rhonchi. Gastrointestinal: Soft and nontender. No distention.  Genitourinary: Deferred Musculoskeletal: Right lower extremity without any swelling or erythema. Nontender to palpation. Neurovascularly intact. Neurologic:  Normal speech and language. No gross focal neurologic deficits are appreciated. Speech is normal.  Skin:  Skin is warm, dry and intact. No rash noted. Psychiatric: Mood and affect are normal. Speech and behavior are normal. Patient exhibits appropriate insight and judgment.  ____________________________________________    LABS (pertinent positives/negatives)  None  ____________________________________________   EKG  None  ____________________________________________    RADIOLOGY  US Venous Right Lower Extremity IMPRESSION: Sonographic survey of the right lower extremity negative for DVT.  There is marginal thrombus/scar of the left common femoral vein which is nonocclusive, potentially representing chronic thrombus of indeterminate age, and favored not to be acute.   ____________________________________________   PROCEDURES  Procedure(s) performed: None  Critical Care performed: No  ____________________________________________   INITIAL IMPRESSION / ASSESSMENT AND PLAN / ED COURSE  Pertinent labs & imaging results that were available during my care of the patient were  reviewed by me and considered in my medical decision making (see chart for details).  Patient presented to the emergency  Today with primary complaints of right leg pain in the setting of an elevated d-dimer. Ultrasound did not show any blood clots in the right lower extremity. There was a questionable scar versus chronic thrombosis of the left main. I did discuss this finding with the patient. At this point patient does not have any signs or symptoms concerning for PE. I did discuss PE return precautions. Given unclear nature of the findings on the ultrasound and patient's advanced age will hold off on anticoagulation. Will have patient discuss with her primary care doctor.  ____________________________________________   FINAL CLINICAL IMPRESSION(S) / ED DIAGNOSES  Final diagnoses:  Pain of right lower extremity     Nance Pear, MD 12/01/14 1934

## 2014-12-01 NOTE — Telephone Encounter (Signed)
Called patient to discuss d-dimer results. Advised that they were elevated. Discussed that the worry is that she has a blood clot either in her leg or in her lungs. My worry would be that she had a DVT and ended up with a PE given her episodes of shortness of breath and palpitations last week and given her overall feeling of not feeling well. Given this concern I advised her to go tot the ED for evaluation as they will be able to obtain appropriate imaging in a stream line manner. CMA will call ahead to the ED to inform them the patient is on the way.

## 2014-12-01 NOTE — Progress Notes (Signed)
Patient ID: Frances Kent, female   DOB: 1930/09/01, 81 y.o.   MRN: [id number]  Tommi Rumps, MD Phone: [phone]  Frances Kent is a 81 y.o. female who presents today for same day visit.   Right leg pain: Patient notes she woke up this morning at 7 AM with pain in her right lower leg between her ankle and knee. She notes it hurts in the calf and in the anterior portion of her leg. It does not hurt to touch. She has no injury. Has not improved. She had similar episodes 1-2 times last week that would hurt for a little while and then go away on their own. She has no swelling in either lower extremity. There is no redness or warmth to either lower extremity. She's not had this issue prior to the last 1-2 weeks. She additionally notes that over the last week she has not felt well. She has a difficult time explaining what this means.  Last week she had 1-2 episodes where she felt like it was difficult to catch her breath for about a minute. She states this was not a short of breath feeling though. She did note one episode of 1 to 2 minutes of palpitations last week. These resolved on their own. Has not had a recurrence of this. She has no history of DVT or PE. She does have a history of phlebitis. She has no chest pain with any of this. She does note mild nausea with this. Though no vomiting, diarrhea, or abdominal pain. She has not had any fevers. She has not been on any long trips. She has not had any recent surgeries. No history of cancer. No cough.  PMH: nonsmoker.   ROS see history of present illness  Objective  Physical Exam Filed Vitals:   12/01/14 1039  BP: 136/88  Pulse: 94  Temp: 98 F (36.7 C)    Physical Exam  Constitutional: She is well-developed, well-nourished, and in no distress.  HENT:  Head: Normocephalic and atraumatic.  Mouth/Throat: Oropharynx is clear and moist. No oropharyngeal exudate.  Eyes: Conjunctivae are normal. Pupils are equal, round, and reactive to  light.  Neck: Neck supple.  Cardiovascular: Normal rate and regular rhythm.  Exam reveals no gallop and no friction rub.   No murmur heard. Pulmonary/Chest: Effort normal and breath sounds normal. No respiratory distress. She has no wheezes. She has no rales.  Abdominal: Soft. Bowel sounds are normal. She exhibits no distension. There is no tenderness. There is no rebound and no guarding.  Musculoskeletal: She exhibits no edema.  Right lower extremity with no swelling, erythema, induration, tenderness at any point, no cords or calf swelling or calf tenderness or calf erythema, she has no joint line tenderness of the right knee, no ligamentous laxity of the right knee,  negative McMurray's, negative calcaneal squeeze test, no tenderness of the Achilles tendon, no tenderness of medial or lateral malleolus, no foot swelling, no tenderness of bony foot, 2+ DP and PT pulses Left lower extremity with no swelling, erythema, induration, tenderness at any point, there are no cords or calf swelling or calf tenderness or calf erythema, she has no joint line tenderness of the left knee, no ligamentous laxity of the left knee, negative McMurray's, negative calcaneal squeeze test, no tenderness of the Achilles tendon, no tenderness of the medial or lateral malleolus, no foot swelling no tenderness of the bony foot, 2+ DP and PT pulses There are no varicose veins or tender veins in  either lower extremity She has full range of motion of both knees and ankles There is no bony tenderness in either leg bilateral feet are warm and well-perfused, good capillary refill Bilateral calves 32 cm  Lymphadenopathy:    She has no cervical adenopathy.  Skin: Skin is warm and dry. No rash noted. She is not diaphoretic. No erythema.   EKG: NSR, rate 78, no ST or T-wave changes  Assessment/Plan: Please see individual problem list.  Pain of right lower extremity Patient with onset of pain this morning in the area between her  ankle and knee. This includes the anterior portion of her lower leg and her calf. She has no abnormalities on exam. She is neurovascularly intact in her lower extremities. There is no calf swelling or cords to indicate DVT. She has no risk factors for DVT. Calves are equal in size making DVT highly unlikely. She has no injury to indicate a need for imaging of her lower extremities with x-rays. Differential for this issue includes electrolyte imbalance, vitamin D deficiency, neuropathy, MSK/arthritic issue, or DVT. Patient additionally had 2 episodes of minimal shortness of breath and a single brief episode of sensation of palpitations last week. She has a history of paroxysmal A. fib and has not had an issue with this in a number of months. EKG today is normal sinus rhythm. EKG is reassuring today with no apparent ischemic changes. She has no risk factors for PE and wells score of 0. Per review of records it does appear that she has had shortness of breath in the past related to mitral valve prolapse and pulmonary hypertension. She has no chest pain, shortness of breath, or palpitations at this time. Her lung exam was normal today and she has normal oxygen saturation making acute pulmonary process and PE an unlikely cause for her shortness of breath. Doubt this is related to DVT or PE given lack of risk factors and stable vital signs today, though we'll check d-dimer to rule out this out. If elevated had a discussion with patient that we may need to send her to the emergency room for CT scan of her chest. We'll check cmet and vitamin D to evaluate for other causes. If lab work returns negative we'll attempt to treat with anti-inflammatories. Given return precautions.    Orders Placed This Encounter  Procedures  . D-Dimer, Quantitative  . Comp Met (CMET)  . Vitamin D (25 hydroxy)  . EKG 12-Lead    Tommi Rumps

## 2014-12-01 NOTE — ED Notes (Signed)
Patient with no complaints at this time. Respirations even and unlabored. Skin warm/dry. Discharge instructions reviewed with patient at this time. Patient given opportunity to voice concerns/ask questions. Patient discharged at this time and left Emergency Department, via wheelchair.   

## 2014-12-01 NOTE — Patient Instructions (Signed)
Nice to meet you. We will check lab work to evaluate for cause of your discomfort.  We will call with the results.  If you develop chest pain, shortness of breath, swelling or one leg, worsening pain, abdominal pain, nausea, vomiting, diarrhea, or fever please seek medical attention.

## 2014-12-01 NOTE — Progress Notes (Signed)
Pre visit review using our clinic review tool, if applicable. No additional management support is needed unless otherwise documented below in the visit note. 

## 2014-12-01 NOTE — Discharge Instructions (Signed)
Please seek medical attention for any high fevers, chest pain, shortness of breath, change in behavior, persistent vomiting, bloody stool or any other new or concerning symptoms.   Pain Without a Known Cause WHAT IS PAIN WITHOUT A KNOWN CAUSE? Pain can occur in any part of the body and can range from mild to severe. Sometimes no cause can be found for why you are having pain. Some types of pain that can occur without a known cause include:   Headache.  Back pain.  Abdominal pain.  Neck pain. HOW IS PAIN WITHOUT A KNOWN CAUSE DIAGNOSED?  Your health care provider will try to find the cause of your pain. This may include:  Physical exam.  Medical history.  Blood tests.  Urine tests.  X-rays. If no cause is found, your health care provider may diagnose you with pain without a known cause.  IS THERE TREATMENT FOR PAIN WITHOUT A CAUSE?  Treatment depends on the kind of pain you have. Your health care provider may prescribe medicines to help relieve your pain.  WHAT CAN I DO AT HOME FOR MY PAIN?   Take medicines only as directed by your health care provider.  Stop any activities that cause pain. During periods of severe pain, bed rest may help.  Try to reduce your stress with activities such as yoga or meditation. Talk to your health care provider for other stress-reducing activity recommendations.  Exercise regularly, if approved by your health care provider.  Eat a healthy diet that includes fruits and vegetables. This may improve pain. Talk to your health care provider if you have any questions about your diet. WHAT IF MY PAIN DOES NOT GET BETTER?  If you have a painful condition and no reason can be found for the pain or the pain gets worse, it is important to follow up with your health care provider. It may be necessary to repeat tests and look further for a possible cause.    This information is not intended to replace advice given to you by your health care provider. Make  sure you discuss any questions you have with your health care provider.   Document Released: 10/26/2000 Document Revised: 02/21/2014 Document Reviewed: 06/18/2013 Elsevier Interactive Patient Education [employer].

## 2014-12-02 ENCOUNTER — Telehealth: Payer: Self-pay | Admitting: Family Medicine

## 2014-12-02 ENCOUNTER — Observation Stay
Admission: EM | Admit: 2014-12-02 | Discharge: 2014-12-03 | Disposition: A | Discharge status: Home / Self Care | Payer: Commercial Managed Care - HMO | Attending: Internal Medicine | Admitting: Internal Medicine

## 2014-12-02 ENCOUNTER — Ambulatory Visit
Admission: RE | Admit: 2014-12-02 | Discharge: 2014-12-02 | Disposition: A | Discharge status: Home / Self Care | Payer: Commercial Managed Care - HMO | Source: Ambulatory Visit | Attending: Family Medicine | Admitting: Family Medicine

## 2014-12-02 ENCOUNTER — Encounter: Payer: Self-pay | Admitting: Emergency Medicine

## 2014-12-02 ENCOUNTER — Telehealth: Payer: Self-pay | Admitting: *Deleted

## 2014-12-02 ENCOUNTER — Observation Stay
Admit: 2014-12-02 | Discharge: 2014-12-02 | Disposition: A | Discharge status: Home / Self Care | Payer: Commercial Managed Care - HMO | Attending: Internal Medicine | Admitting: Internal Medicine

## 2014-12-02 DIAGNOSIS — I2609 Other pulmonary embolism with acute cor pulmonale: Secondary | ICD-10-CM | POA: Diagnosis not present

## 2014-12-02 DIAGNOSIS — F419 Anxiety disorder, unspecified: Secondary | ICD-10-CM | POA: Diagnosis not present

## 2014-12-02 DIAGNOSIS — I340 Nonrheumatic mitral (valve) insufficiency: Secondary | ICD-10-CM | POA: Diagnosis not present

## 2014-12-02 DIAGNOSIS — Z9049 Acquired absence of other specified parts of digestive tract: Secondary | ICD-10-CM | POA: Insufficient documentation

## 2014-12-02 DIAGNOSIS — R55 Syncope and collapse: Secondary | ICD-10-CM | POA: Diagnosis not present

## 2014-12-02 DIAGNOSIS — H3530 Unspecified macular degeneration: Secondary | ICD-10-CM | POA: Diagnosis not present

## 2014-12-02 DIAGNOSIS — M8580 Other specified disorders of bone density and structure, unspecified site: Secondary | ICD-10-CM | POA: Diagnosis not present

## 2014-12-02 DIAGNOSIS — G4700 Insomnia, unspecified: Secondary | ICD-10-CM | POA: Diagnosis not present

## 2014-12-02 DIAGNOSIS — R7989 Other specified abnormal findings of blood chemistry: Secondary | ICD-10-CM

## 2014-12-02 DIAGNOSIS — I2699 Other pulmonary embolism without acute cor pulmonale: Principal | ICD-10-CM | POA: Insufficient documentation

## 2014-12-02 DIAGNOSIS — I480 Paroxysmal atrial fibrillation: Secondary | ICD-10-CM | POA: Diagnosis not present

## 2014-12-02 DIAGNOSIS — R42 Dizziness and giddiness: Secondary | ICD-10-CM | POA: Insufficient documentation

## 2014-12-02 DIAGNOSIS — F411 Generalized anxiety disorder: Secondary | ICD-10-CM | POA: Diagnosis not present

## 2014-12-02 DIAGNOSIS — M79604 Pain in right leg: Secondary | ICD-10-CM | POA: Diagnosis not present

## 2014-12-02 DIAGNOSIS — Z86711 Personal history of pulmonary embolism: Secondary | ICD-10-CM | POA: Diagnosis present

## 2014-12-02 DIAGNOSIS — Z803 Family history of malignant neoplasm of breast: Secondary | ICD-10-CM | POA: Diagnosis not present

## 2014-12-02 DIAGNOSIS — R918 Other nonspecific abnormal finding of lung field: Secondary | ICD-10-CM | POA: Insufficient documentation

## 2014-12-02 DIAGNOSIS — I517 Cardiomegaly: Secondary | ICD-10-CM | POA: Diagnosis not present

## 2014-12-02 DIAGNOSIS — Z79899 Other long term (current) drug therapy: Secondary | ICD-10-CM | POA: Insufficient documentation

## 2014-12-02 DIAGNOSIS — R05 Cough: Secondary | ICD-10-CM | POA: Insufficient documentation

## 2014-12-02 DIAGNOSIS — R0602 Shortness of breath: Secondary | ICD-10-CM | POA: Diagnosis not present

## 2014-12-02 DIAGNOSIS — Z800 Family history of malignant neoplasm of digestive organs: Secondary | ICD-10-CM | POA: Insufficient documentation

## 2014-12-02 DIAGNOSIS — I341 Nonrheumatic mitral (valve) prolapse: Secondary | ICD-10-CM | POA: Diagnosis not present

## 2014-12-02 DIAGNOSIS — I071 Rheumatic tricuspid insufficiency: Secondary | ICD-10-CM | POA: Diagnosis not present

## 2014-12-02 DIAGNOSIS — Z8249 Family history of ischemic heart disease and other diseases of the circulatory system: Secondary | ICD-10-CM | POA: Insufficient documentation

## 2014-12-02 DIAGNOSIS — J84112 Idiopathic pulmonary fibrosis: Secondary | ICD-10-CM | POA: Diagnosis not present

## 2014-12-02 DIAGNOSIS — R002 Palpitations: Secondary | ICD-10-CM | POA: Insufficient documentation

## 2014-12-02 DIAGNOSIS — E785 Hyperlipidemia, unspecified: Secondary | ICD-10-CM | POA: Diagnosis not present

## 2014-12-02 DIAGNOSIS — Z8709 Personal history of other diseases of the respiratory system: Secondary | ICD-10-CM | POA: Diagnosis not present

## 2014-12-02 DIAGNOSIS — I5031 Acute diastolic (congestive) heart failure: Secondary | ICD-10-CM | POA: Diagnosis not present

## 2014-12-02 DIAGNOSIS — R110 Nausea: Secondary | ICD-10-CM | POA: Diagnosis not present

## 2014-12-02 DIAGNOSIS — R5383 Other fatigue: Secondary | ICD-10-CM | POA: Diagnosis not present

## 2014-12-02 DIAGNOSIS — Z7982 Long term (current) use of aspirin: Secondary | ICD-10-CM | POA: Diagnosis not present

## 2014-12-02 DIAGNOSIS — K219 Gastro-esophageal reflux disease without esophagitis: Secondary | ICD-10-CM | POA: Insufficient documentation

## 2014-12-02 LAB — BASIC METABOLIC PANEL
Anion gap: 7 (ref 5–15)
BUN: 13 mg/dL (ref 6–20)
CALCIUM: 9.3 mg/dL (ref 8.9–10.3)
CO2: 28 mmol/L (ref 22–32)
CREATININE: 0.82 mg/dL (ref 0.44–1.00)
Chloride: 106 mmol/L (ref 101–111)
GFR calc Af Amer: >60 mL/min (ref >60)
GFR calc non Af Amer: >60 mL/min (ref >60)
GLUCOSE: 95 mg/dL (ref 65–99)
Potassium: 4 mmol/L (ref 3.5–5.1)
Sodium: 141 mmol/L (ref 135–145)

## 2014-12-02 LAB — CBC
HEMATOCRIT: 42 % (ref 35.0–47.0)
Hemoglobin: 13.6 g/dL (ref 12.0–16.0)
MCH: 29.4 pg (ref 26.0–34.0)
MCHC: 32.3 g/dL (ref 32.0–36.0)
MCV: 90.9 fL (ref 80.0–100.0)
Platelets: 272 10*3/uL (ref 150–440)
RBC: 4.62 MIL/uL (ref 3.80–5.20)
RDW: 14.9 % — AB (ref 11.5–14.5)
WBC: 5.8 10*3/uL (ref 3.6–11.0)

## 2014-12-02 LAB — APTT: aPTT: 29 seconds (ref 24–36)

## 2014-12-02 LAB — PROTIME-INR
INR: 0.94
Prothrombin Time: 12.8 seconds (ref 11.4–15.0)

## 2014-12-02 LAB — TROPONIN I: Troponin I: <0.03 ng/mL (ref <0.031)

## 2014-12-02 MED ORDER — ACETAMINOPHEN 650 MG RE SUPP: 650.0000 mg | Freq: Four times a day (QID) | RECTAL | Status: DC | PRN

## 2014-12-02 MED ORDER — IOHEXOL 350 MG/ML SOLN
75.0000 mL | Freq: Once | INTRAVENOUS | Status: AC | PRN
  Administered 2014-12-02: 75 mL via INTRAVENOUS

## 2014-12-02 MED ORDER — APIXABAN 5 MG PO TABS
5.0000 mg | ORAL_TABLET | Freq: Two times a day (BID) | ORAL | Status: DC
Start: 2014-12-09 — End: 2014-12-03

## 2014-12-02 MED ORDER — APIXABAN 5 MG PO TABS
10.0000 mg | ORAL_TABLET | Freq: Two times a day (BID) | ORAL | Status: DC
  Administered 2014-12-02 (×2): 10 mg via ORAL
  Filled 2014-12-02 (×3): qty 2

## 2014-12-02 MED ORDER — ENOXAPARIN SODIUM 60 MG/0.6ML ~~LOC~~ SOLN
1.0000 mg/kg | Freq: Once | SUBCUTANEOUS | Status: DC
  Filled 2014-12-02 (×2): qty 0.6

## 2014-12-02 MED ORDER — PANTOPRAZOLE SODIUM 40 MG PO TBEC
40.0000 mg | DELAYED_RELEASE_TABLET | Freq: Every day | ORAL | Status: DC
  Administered 2014-12-02 – 2014-12-03 (×2): 40 mg via ORAL
  Filled 2014-12-02 (×2): qty 1

## 2014-12-02 MED ORDER — SODIUM CHLORIDE 0.9 % IJ SOLN
3.0000 mL | Freq: Two times a day (BID) | INTRAMUSCULAR | Status: DC
  Administered 2014-12-02 – 2014-12-03 (×2): 3 mL via INTRAVENOUS

## 2014-12-02 MED ORDER — ALPRAZOLAM 0.25 MG PO TABS: 0.2500 mg | ORAL_TABLET | Freq: Two times a day (BID) | ORAL | Status: DC | PRN

## 2014-12-02 MED ORDER — ACETAMINOPHEN 325 MG PO TABS: 650.0000 mg | ORAL_TABLET | Freq: Four times a day (QID) | ORAL | Status: DC | PRN

## 2014-12-02 NOTE — Progress Notes (Signed)
*  PRELIMINARY RESULTS* Echocardiogram 2D Echocardiogram has been performed.  Frances Kent 12/02/2014, 6:15 PM

## 2014-12-02 NOTE — Consult Note (Signed)
ANTICOAGULATION CONSULT NOTE - Initial Consult  Pharmacy Consult for apixaban Indication: PE  No Known Allergies  Patient Measurements:   Heparin Dosing Weight:   Vital Signs: BP: 156/81 mmHg (10/18 1157) Pulse Rate: 79 (10/18 1157)  Labs:  Recent Labs  12/01/14 1148 12/02/14 1150  HGB  --  13.6  HCT  --  42.0  PLT  --  272  APTT  --  29  LABPROT  --  12.8  INR  --  0.94  CREATININE 0.85 0.82  TROPONINI  --  <0.03    Estimated Creatinine Clearance: 43.8 mL/min (by C-G formula based on Cr of 0.82).   Medical History: Past Medical History  Diagnosis Date  . GERD (gastroesophageal reflux disease)   . IPF (idiopathic pulmonary fibrosis) ([place]) 20111    by CT, PFTS done by [facility]    Medications:  Scheduled:  . enoxaparin (LOVENOX) injection  1 mg/kg Subcutaneous Once  . pantoprazole  40 mg Oral Daily    Assessment: Pt is a 81 year old female with a hx of afib, not on anticoagulation at home, who presents with a PE. Pharmacy consulted to dose apixaban. Pt was ordered a therapeutic dose of lovenox, but it wasn't given.  Goal of Therapy:   Monitor platelets by anticoagulation protocol: Yes   Plan:  Will start apixaban 10mg  bid for 7 days then transition to 5mg  bid. Pharmacy to continue to monitor.  Aeson Sawyers D Eryanna Regal 12/02/2014,2:16 PM

## 2014-12-02 NOTE — ED Notes (Signed)
Sent from CT with positive PE. Pt complains of slight SOB since last week with cough. Denies any chest pain. Vital signs are stable at this time.

## 2014-12-02 NOTE — ED Provider Notes (Signed)
[facility] Emergency Department Provider Note  ____________________________________________  Time seen: Approximately 12:00 PM  I have reviewed the triage vital signs and the nursing notes.   HISTORY  Chief Complaint No chief complaint on file.    HPI Frances Kent is a 81 y.o. female with a history of idiopathic pulmonary fibrosis and mitral valve prolapse, paroxysmal A. fib, not anticoagulated since from radiology for multiple right-sided pulmonary emboli. Patient was seen yesterday in the ED for a right lower extremity pain with positive d-dimer; ultrasound was negative for DVT area did since then, her leg pain has resolved. She does report increase in her baseline cough that she generally attributes to GERD, and occasional episodes over the last week of shortness of breath that resolved spontaneously after several seconds. No chest pain, fever, syncope, or palpitations.   Past Medical History  Diagnosis Date  . GERD (gastroesophageal reflux disease)   . IPF (idiopathic pulmonary fibrosis) ([place]) [address]11    by CT, PFTS done by [facility]    Patient Active Problem List   Diagnosis Date Noted  . Pain of right lower extremity 12/01/2014  . Insomnia 10/04/2014  . PAF (paroxysmal atrial fibrillation) ([place]) 04/03/2014  . Skin lesion of face 12/21/2013  . Achilles tendinosis 12/21/2013  . Generalized anxiety disorder 03/28/2013  . Mitral valve prolapse 03/28/2013  . Medicare annual wellness visit, subsequent 03/23/2012  . IPF (idiopathic pulmonary fibrosis) ([place])   . Screening for breast cancer 11/29/2010  . Macular degeneration, left eye 11/29/2010  . Screening for colon cancer 11/29/2010  . Hyperlipidemia LDL goal <160 11/29/2010  . GERD (gastroesophageal reflux disease)     Past Surgical History  Procedure Laterality Date  . Appendectomy  1960  . Cholecystectomy  1985    Current Outpatient Rx  Name  Route  Sig  Dispense  Refill  . acidophilus  (RISAQUAD) CAPS capsule   Oral   Take 1 capsule by mouth daily.         Marland Kitchen ALPRAZolam (XANAX) 0.25 MG tablet   Oral   Take 1 tablet (0.25 mg total) by mouth 2 (two) times daily as needed for anxiety.   60 tablet   0   . aspirin EC 81 MG tablet   Oral   Take 81-162 mg by mouth as needed for mild pain (or heart health (pt will use 2-3 times weekly)).          . pantoprazole (PROTONIX) 40 MG tablet   Oral   Take 40 mg by mouth daily.           Allergies Review of patient's allergies indicates no known allergies.  Family History  Problem Relation Age of Onset  . Cancer Mother 28    breast cancer, lived to 7,   . Heart disease Maternal Grandmother 71    died of massive MI  . Cancer Son 61    pancreatic cancer  . Heart disease Son     CAD, Tobacco Abuse     Social History Social History  Substance Use Topics  . Smoking status: Never Smoker   . Smokeless tobacco: Never Used     Comment: 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 , worked at [employer], [employer]  . Alcohol Use: No    Review of Systems Constitutional: No fever/chills. No lightheadedness or syncope. Eyes: No visual changes. ENT: No sore throat. Cardiovascular: Denies chest pain, palpitations. Respiratory: Positive shortness of breath.  Positive cough. Gastrointestinal: No abdominal pain.  No nausea, no vomiting.  No diarrhea.  No constipation. Genitourinary: Negative for dysuria. Musculoskeletal: Negative for back pain. Right lower extremity leg pain, now resolved. Skin: Negative for rash. Neurological: Negative for headaches, focal weakness or numbness.  10-point ROS otherwise negative.  ____________________________________________   PHYSICAL EXAM:  VITAL SIGNS: ED Triage Vitals  Enc Vitals Group     BP 12/02/14 1157 156/81 mmHg     Pulse Rate 12/02/14 1157 79     Resp 12/02/14 1157 16     Temp --      Temp src --      SpO2 12/02/14 1157 99 %     Weight --      Height --      Head Cir --      Peak Flow  --      Pain Score 12/02/14 1158 0     Pain Loc --      Pain Edu? --      Excl. in [place]? --     Constitutional: Alert and oriented. Well appearing and in no acute distress. Answer question appropriately. Eyes: Conjunctivae are normal.  EOMI. Head: Atraumatic. Nose: No congestion/rhinnorhea. Mouth/Throat: Mucous membranes are moist.  Neck: No stridor.  Supple.  No JVD. Cardiovascular: Normal rate, regular rhythm. No murmurs, rubs or gallops.  Respiratory: Normal respiratory effort.  No retractions. Lungs CTAB.  No wheezes, rales or ronchi. Gastrointestinal: Soft and nontender. No distention. No peritoneal signs. Musculoskeletal: No LE edema. No calf pain, palpable cords or Homans sign. Neurologic:  Normal speech and language. No gross focal neurologic deficits are appreciated.  Skin:  Skin is warm, dry and intact. No rash noted. Psychiatric: Mood and affect are normal. Speech and behavior are normal.  Normal judgement.  ____________________________________________   LABS (all labs ordered are listed, but only abnormal results are displayed)  Labs Reviewed  CBC  BASIC METABOLIC PANEL  APTT  PROTIME-INR  TROPONIN I   ____________________________________________  EKG  ED ECG REPORT I, Eula Listen, the attending physician, personally viewed and interpreted this ECG.   Date: 12/02/2014  EKG Time: 1203  Rate: 74  Rhythm: normal sinus rhythm  Axis: Leftward  Intervals: Prolonged QTC  ST&T Change: Nonspecific T-wave inversion at V1. No evidence of right heart strain.  ____________________________________________  RADIOLOGY  Ct Angio Chest W/cm &/or Wo Cm  12/02/2014  CLINICAL DATA:  Shortness of breath for 1 week EXAM: CT ANGIOGRAPHY CHEST WITH CONTRAST TECHNIQUE: Multidetector CT imaging of the chest was performed using the standard protocol during bolus administration of intravenous contrast. Multiplanar CT image reconstructions and MIPs were obtained to  evaluate the vascular anatomy. CONTRAST:  50mL OMNIPAQUE IOHEXOL 350 MG/ML SOLN COMPARISON:  01/02/2013 FINDINGS: Central airways are patent. No mediastinal hematoma or adenopathy. Mild perihilar peribronchial thickening. No hilar adenopathy. Cardiomegaly is noted. Trace posterior pericardial effusion. The visualized upper abdomen shows no adrenal gland mass. The patient is status postcholecystectomy. The study is of excellent technical quality. Nonocclusive pulmonary emboli noted in right upper lobe axial image 56. Nonocclusive pulmonary embolus is noted in right lower lobe axial image 73. The right ventricle over left ventricle ratio is 0.94 without significant right heart strain. Images of the lung parenchyma shows no acute infiltrate or pulmonary edema. Mild atherosclerotic calcifications of thoracic aorta and coronary arteries. Sagittal images of the spine shows degenerative changes and osteopenia thoracic spine. Review of the MIP images confirms the above findings. IMPRESSION: 1. Pulmonary emboli as described above in right upper lobe and right lower lobe.  No significant right heart strain. 2. No acute infiltrate or pulmonary edema. 3. Osteopenia and degenerative changes thoracic spine. 4. Mild perihilar peribronchial thickening. These results were called by telephone at the time of interpretation on 12/02/2014 at 10:56 am to Dr. Tommi Rumps , who verbally acknowledged these results. Electronically Signed   By: Lahoma Crocker M.D.   On: 12/02/2014 11:02   US Venous Img Lower Unilateral Right  12/01/2014  CLINICAL DATA:  81 year old female with a history of right lower extremity pain EXAM: RIGHT LOWER EXTREMITY VENOUS DOPPLER ULTRASOUND TECHNIQUE: Gray-scale sonography with graded compression, as well as color Doppler and duplex ultrasound were performed to evaluate the lower extremity deep venous systems from the level of the common femoral vein and including the common femoral, femoral, profunda femoral,  popliteal and calf veins including the posterior tibial, peroneal and gastrocnemius veins when visible. The superficial great saphenous vein was also interrogated. Spectral Doppler was utilized to evaluate flow at rest and with distal augmentation maneuvers in the common femoral, femoral and popliteal veins. COMPARISON:  02/05/2013 FINDINGS: Contralateral Common Femoral Vein: Respiratory phasicity is normal and symmetric with the symptomatic side. Marginal thrombus/ synechiae along the wall of the left common femoral vein. Vein is compressible. Flow signal maintained. Common Femoral Vein: No evidence of thrombus. Normal compressibility, respiratory phasicity and response to augmentation. Saphenofemoral Junction: No evidence of thrombus. Normal compressibility and flow on color Doppler imaging. Profunda Femoral Vein: No evidence of thrombus. Normal compressibility and flow on color Doppler imaging. Femoral Vein: No evidence of thrombus. Normal compressibility, respiratory phasicity and response to augmentation. Popliteal Vein: No evidence of thrombus. Normal compressibility, respiratory phasicity and response to augmentation. Calf Veins: No evidence of thrombus. Normal compressibility and flow on color Doppler imaging. Superficial Great Saphenous Vein: No evidence of thrombus. Normal compressibility and flow on color Doppler imaging. Other Findings:  None. IMPRESSION: Sonographic survey of the right lower extremity negative for DVT. There is marginal thrombus/scar of the left common femoral vein which is nonocclusive, potentially representing chronic thrombus of indeterminate age, and favored not to be acute. Signed, Dulcy Fanny. Earleen Newport, DO [facility] Radiology Electronically Signed   By: Corrie Mckusick D.O.   On: 12/01/2014 18:18    ____________________________________________   PROCEDURES  Procedure(s) performed: None  Critical Care performed:  No ____________________________________________   INITIAL IMPRESSION / ASSESSMENT AND PLAN / ED COURSE  Pertinent labs & imaging results that were available during my care of the patient were reviewed by me and considered in my medical decision making (see chart for details).  81 y.o. female with multiple right-sided pulmonary emboli, has been intermittently symptomatic but has normal vital signs at this time. I will plan to get labs, and admit the patient to the hospitalist service.  ____________________________________________  FINAL CLINICAL IMPRESSION(S) / ED DIAGNOSES  Final diagnoses:  None      NEW MEDICATIONS STARTED DURING THIS VISIT:  New Prescriptions   No medications on file     Eula Listen, MD 12/02/14 1205

## 2014-12-02 NOTE — Telephone Encounter (Signed)
Attempted to call patient regarding CTA chest results. There is no answer on her mobile phone and left a message asking her to call back to the office. I called her home phone number and a female answered. She stated that the patient was not home. I asked that once the patient returned home that she give [place] a call the office immediately.   The results came back with 3 PEs in her right lung. There are nonocclusive. There is no right heart strain. When patient calls back on discussion with her regarding treatment options. If she is feeling poorly, has chest pain, shortness of breath, or has any issues that are new we'll advise her to go back to the emergency room for management and evaluation. If she feels well we'll see her in the office today to discuss anticoagulation for this. Will await patient call back

## 2014-12-02 NOTE — Telephone Encounter (Signed)
Spoke with patient regarding her ED visit and [place] result from the ED. Advised that there was a possible partially occlusive chronic thrombus. Given this and her recent history of shortness of breath and palpitations I discussed with the patient that a CT scan of her chest looking for a blood clot would be beneficial. Patient notes she would like to do this as she just does not feel right. She is still unable to elaborate on what this means. She has no chest pain or trouble breathing right now. Will order CTA of the chest to evaluate for PE. Referral coordinator was performed of this she could schedule this today. They will call patient with the time of this imaging study. Given return precautions.

## 2014-12-02 NOTE — ED Notes (Signed)
1450 and 1500 charted in error

## 2014-12-02 NOTE — Telephone Encounter (Signed)
Patient has requested that the office view her lab and test results from the emergency room visit from yesterday.

## 2014-12-02 NOTE — Telephone Encounter (Signed)
Please advise 

## 2014-12-02 NOTE — Telephone Encounter (Signed)
Spoke with patient regarding results. Patient she still does not feel well. She had 2 episodes of central chest pain this morning. These did not radiate. She had no shortness of breath with this. It lasted several seconds and resolved on their own. Discussed treatment options with patient including  evaluation in the emergency room for this given her chest pain versus being seen in our office for evaluation and starting of anticoagulant therapy. I advised the patient that given her chest pain I felt it was necessary for her to be evaluated in the emergency room for possible admission to the hospital started on anticoagulant therapy. She voiced understanding of this and stated she will go to the emergency room now. She is still in the CT department. My CMA called the charge nurse in the ED and informed them that the patient was on her way and had been found to have 3 PEs on CTA of the chest and had chest pain this morning.

## 2014-12-02 NOTE — H&P (Signed)
[facility] [place] NAME: Frances Kent    MR#:  [id number]  DATE OF BIRTH:  1930-06-03  DATE OF ADMISSION:  12/02/2014  PRIMARY CARE PHYSICIAN: [name], MD   REQUESTING/REFERRING PHYSICIAN: Dr. Mariea Clonts  CHIEF COMPLAINT:   Chief Complaint  Patient presents with  . Shortness of Breath    HISTORY OF PRESENT ILLNESS:  Frances Kent  is a 81 y.o. female with a known history of idiopathic pulmonary fibrosis, mitral valve prolapse with severe mitral valve regurgitation, SVT, paroxysmal atrial fibrillation not on anticoagulation, reflux and anxiety who presents today due to finding of multiple right-sided pulmonary emboli on CT angiography. She was seen in the emergency room yesterday for right lower extremity pain and positive d-dimer but had a negative venous Doppler at that time. He has had no leg swelling. The pain in her leg has largely resolved. She reports several weeks of feeling fatigued. She states that occasionally, usually in the evening, she feels so bad she could "die". She has palpitations lasting for 3-5 seconds. Her blood pressure has been running low at home. She denies shortness of breath, chest pain or cough. About 2 weeks ago she had an episode of lightheadedness and dizziness and felt presyncopal this resolved on its own. Due to her symptoms of presyncope, fatigue, her history of valvular disorder and arrhythmias and new finding of pulmonary emboli she is being admitted for further evaluation and initiation of anticoagulation.  PAST MEDICAL HISTORY:   Past Medical History  Diagnosis Date  . GERD (gastroesophageal reflux disease)   . IPF (idiopathic pulmonary fibrosis) ([place]) [address]11    by CT, PFTS done by Humphrey Rolls    PAST SURGICAL HISTORY:   Past Surgical History  Procedure Laterality Date  . Appendectomy  1960  . Cholecystectomy  1985    SOCIAL HISTORY:   Social History  Substance Use Topics  . Smoking  status: Never Smoker   . Smokeless tobacco: Never Used     Comment: 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 , worked at [employer], [employer]  . Alcohol Use: No    FAMILY HISTORY:   Family History  Problem Relation Age of Onset  . Cancer Mother 41    breast cancer, lived to 72,   . Heart disease Maternal Grandmother 91    died of massive MI  . Cancer Son 32    pancreatic cancer  . Heart disease Son     CAD, Tobacco Abuse     DRUG ALLERGIES:  No Known Allergies  REVIEW OF SYSTEMS:   Review of Systems  Constitutional: Positive for malaise/fatigue. Negative for fever, chills and weight loss.  HENT: Negative for congestion and hearing loss.   Eyes: Negative for blurred vision and pain.  Respiratory: Negative for cough, hemoptysis, sputum production, shortness of breath and stridor.   Cardiovascular: Positive for palpitations. Negative for chest pain, orthopnea and leg swelling.  Gastrointestinal: Negative for nausea, vomiting, abdominal pain, diarrhea, constipation and blood in stool.  Genitourinary: Negative for dysuria and frequency.  Musculoskeletal: Negative for myalgias, back pain, joint pain and neck pain.  Skin: Negative for rash.  Neurological: Negative for focal weakness, loss of consciousness and headaches.  Endo/Heme/Allergies: Does not bruise/bleed easily.  Psychiatric/Behavioral: Negative for depression and hallucinations. The patient is not nervous/anxious.     MEDICATIONS AT HOME:   Prior to Admission medications   Medication Sig Start Date End Date Taking? Authorizing Provider  acidophilus (RISAQUAD) CAPS capsule Take 1  capsule by mouth daily.   Yes Historical Provider, MD  ALPRAZolam (XANAX) 0.25 MG tablet Take 1 tablet (0.25 mg total) by mouth 2 (two) times daily as needed for anxiety. 04/02/14  Yes [name], MD  aspirin EC 81 MG tablet Take 81-162 mg by mouth as needed for mild pain (or heart health (pt will use 2-3 times weekly)).    Yes Historical Provider, MD   pantoprazole (PROTONIX) 40 MG tablet Take 40 mg by mouth daily.   Yes Historical Provider, MD      VITAL SIGNS:  Blood pressure 156/81, pulse 79, resp. rate 16, SpO2 99 %.  PHYSICAL EXAMINATION:  GENERAL:  81 y.o.-year-old patient lying in the bed with no acute distress.  EYES: Pupils equal, round, reactive to light and accommodation. No scleral icterus. Extraocular muscles intact.  HEENT: Head atraumatic, normocephalic. Oropharynx and nasopharynx clear. Oral mucous membranes pink and moist. NECK:  Supple, no jugular venous distention. No thyroid enlargement, no tenderness.  LUNGS: Normal breath sounds bilaterally, no wheezing, rales, rhonchi or crepitation. No use of accessory muscles of respiration.  CARDIOVASCULAR: S1, S2 normal. No murmurs, rubs, or gallops.  ABDOMEN: Soft, nontender, nondistended. Bowel sounds present. No organomegaly or mass.  EXTREMITIES: No pedal edema, cyanosis, or clubbing. No tenderness. Peripheral pulses 2+ NEUROLOGIC: Cranial nerves II through XII are intact. Muscle strength 5/5 in all extremities. Sensation intact. Gait not checked.  PSYCHIATRIC: The patient is alert and oriented x 3.  SKIN: No obvious rash, lesion, or ulcer.   LABORATORY PANEL:   CBC  Recent Labs Lab 12/02/14 1150  WBC 5.8  HGB 13.6  HCT 42.0  PLT 272   ------------------------------------------------------------------------------------------------------------------  Chemistries   Recent Labs Lab 12/01/14 1148 12/02/14 1150  NA 143 141  K 5.1 4.0  CL 107 106  CO2 30 28  GLUCOSE 111* 95  BUN 10 13  CREATININE 0.85 0.82  CALCIUM 9.8 9.3  AST 15  --   ALT 15  --   ALKPHOS 88  --   BILITOT 0.5  --    ------------------------------------------------------------------------------------------------------------------  Cardiac Enzymes  Recent Labs Lab 12/02/14 1150  TROPONINI <0.03    ------------------------------------------------------------------------------------------------------------------  RADIOLOGY:  Ct Angio Chest W/cm &/or Wo Cm  12/02/2014  CLINICAL DATA:  Shortness of breath for 1 week EXAM: CT ANGIOGRAPHY CHEST WITH CONTRAST TECHNIQUE: Multidetector CT imaging of the chest was performed using the standard protocol during bolus administration of intravenous contrast. Multiplanar CT image reconstructions and MIPs were obtained to evaluate the vascular anatomy. CONTRAST:  76mL OMNIPAQUE IOHEXOL 350 MG/ML SOLN COMPARISON:  01/02/2013 FINDINGS: Central airways are patent. No mediastinal hematoma or adenopathy. Mild perihilar peribronchial thickening. No hilar adenopathy. Cardiomegaly is noted. Trace posterior pericardial effusion. The visualized upper abdomen shows no adrenal gland mass. The patient is status postcholecystectomy. The study is of excellent technical quality. Nonocclusive pulmonary emboli noted in right upper lobe axial image 56. Nonocclusive pulmonary embolus is noted in right lower lobe axial image 73. The right ventricle over left ventricle ratio is 0.94 without significant right heart strain. Images of the lung parenchyma shows no acute infiltrate or pulmonary edema. Mild atherosclerotic calcifications of thoracic aorta and coronary arteries. Sagittal images of the spine shows degenerative changes and osteopenia thoracic spine. Review of the MIP images confirms the above findings. IMPRESSION: 1. Pulmonary emboli as described above in right upper lobe and right lower lobe. No significant right heart strain. 2. No acute infiltrate or pulmonary edema. 3. Osteopenia and degenerative  changes thoracic spine. 4. Mild perihilar peribronchial thickening. These results were called by telephone at the time of interpretation on 12/02/2014 at 10:56 am to Dr. Tommi Rumps , who verbally acknowledged these results. Electronically Signed   By: Lahoma Crocker M.D.   On:  12/02/2014 11:02   US Venous Img Lower Unilateral Right  12/01/2014  CLINICAL DATA:  81 year old female with a history of right lower extremity pain EXAM: RIGHT LOWER EXTREMITY VENOUS DOPPLER ULTRASOUND TECHNIQUE: Gray-scale sonography with graded compression, as well as color Doppler and duplex ultrasound were performed to evaluate the lower extremity deep venous systems from the level of the common femoral vein and including the common femoral, femoral, profunda femoral, popliteal and calf veins including the posterior tibial, peroneal and gastrocnemius veins when visible. The superficial great saphenous vein was also interrogated. Spectral Doppler was utilized to evaluate flow at rest and with distal augmentation maneuvers in the common femoral, femoral and popliteal veins. COMPARISON:  02/05/2013 FINDINGS: Contralateral Common Femoral Vein: Respiratory phasicity is normal and symmetric with the symptomatic side. Marginal thrombus/ synechiae along the wall of the left common femoral vein. Vein is compressible. Flow signal maintained. Common Femoral Vein: No evidence of thrombus. Normal compressibility, respiratory phasicity and response to augmentation. Saphenofemoral Junction: No evidence of thrombus. Normal compressibility and flow on color Doppler imaging. Profunda Femoral Vein: No evidence of thrombus. Normal compressibility and flow on color Doppler imaging. Femoral Vein: No evidence of thrombus. Normal compressibility, respiratory phasicity and response to augmentation. Popliteal Vein: No evidence of thrombus. Normal compressibility, respiratory phasicity and response to augmentation. Calf Veins: No evidence of thrombus. Normal compressibility and flow on color Doppler imaging. Superficial Great Saphenous Vein: No evidence of thrombus. Normal compressibility and flow on color Doppler imaging. Other Findings:  None. IMPRESSION: Sonographic survey of the right lower extremity negative for DVT. There is  marginal thrombus/scar of the left common femoral vein which is nonocclusive, potentially representing chronic thrombus of indeterminate age, and favored not to be acute. Signed, Dulcy Fanny. Earleen Newport, DO [facility] Electronically Signed   By: Corrie Mckusick D.O.   On: 12/01/2014 18:18    EKG:   Orders placed or performed in visit on 12/01/14  . EKG 12-Lead    IMPRESSION AND PLAN:   #1 acute pulmonary emboli: Likely had right lower extremity DVT which is now mobilized. She's had palpitations and presyncope over the past few weeks with increasing fatigue. She's noticed her blood pressure running slightly low at home. We'll get a 2-D echocardiogram to evaluate for right-sided strain. She is up-to-date on preventative care last mammogram 1 year ago last colonoscopy 5 years ago. No recent trauma to the right leg. No recent prolonged immobility. Will obtain hypercoagulability panel. She has received Lovenox in the emergency room, I will start her on eliquis.  #2 paroxysmal atrial fibrillation: Currently in normal sinus. Observe on telemetry. Not on anticoagulation at home.  #3 idiopathic pulmonary fibrosis: No shortness of breath or symptoms at this time. Not on any medications for this at home.  #4 mitral valve prolapse with mitral valve regurgitation: Repeating 2-D echocardiogram today to look for right-sided strain. Her cardiologist is Dr. [name].  #5 GERD continue Protonix  #6 anxiety: Continue Xanax that she takes at home as needed   All the records are reviewed and case discussed with ED provider. Management plans discussed with the patient, family and they are in agreement.  CODE STATUS: Full   TOTAL TIME  TAKING CARE OF THIS PATIENT: 45 minutes.  Greater than 50% of time spent in coordination of care and counseling.  Myrtis Ser M.D on 12/02/2014 at 1:43 PM  Between 7am to 6pm - Pager - [phone]  After 6pm go to  [email or web address] - password [email or web address]  Office  [phone]  CC: Primary care physician; [name], MD

## 2014-12-03 ENCOUNTER — Encounter: Payer: Self-pay | Admitting: *Deleted

## 2014-12-03 ENCOUNTER — Emergency Department
Admission: EM | Admit: 2014-12-03 | Discharge: 2014-12-03 | Discharge status: Against Medical Advice | Payer: Commercial Managed Care - HMO | Source: Home / Self Care

## 2014-12-03 DIAGNOSIS — K219 Gastro-esophageal reflux disease without esophagitis: Secondary | ICD-10-CM | POA: Diagnosis not present

## 2014-12-03 DIAGNOSIS — J811 Chronic pulmonary edema: Secondary | ICD-10-CM | POA: Diagnosis not present

## 2014-12-03 DIAGNOSIS — R5383 Other fatigue: Secondary | ICD-10-CM | POA: Diagnosis not present

## 2014-12-03 DIAGNOSIS — R0602 Shortness of breath: Secondary | ICD-10-CM | POA: Diagnosis not present

## 2014-12-03 DIAGNOSIS — Z86711 Personal history of pulmonary embolism: Secondary | ICD-10-CM | POA: Diagnosis not present

## 2014-12-03 DIAGNOSIS — G4700 Insomnia, unspecified: Secondary | ICD-10-CM | POA: Diagnosis not present

## 2014-12-03 DIAGNOSIS — I341 Nonrheumatic mitral (valve) prolapse: Secondary | ICD-10-CM | POA: Diagnosis not present

## 2014-12-03 DIAGNOSIS — I5031 Acute diastolic (congestive) heart failure: Secondary | ICD-10-CM | POA: Diagnosis not present

## 2014-12-03 DIAGNOSIS — R110 Nausea: Secondary | ICD-10-CM

## 2014-12-03 DIAGNOSIS — I2699 Other pulmonary embolism without acute cor pulmonale: Secondary | ICD-10-CM | POA: Diagnosis not present

## 2014-12-03 DIAGNOSIS — I340 Nonrheumatic mitral (valve) insufficiency: Secondary | ICD-10-CM | POA: Diagnosis not present

## 2014-12-03 DIAGNOSIS — J84112 Idiopathic pulmonary fibrosis: Secondary | ICD-10-CM | POA: Diagnosis not present

## 2014-12-03 DIAGNOSIS — I272 Other secondary pulmonary hypertension: Secondary | ICD-10-CM | POA: Diagnosis not present

## 2014-12-03 DIAGNOSIS — I517 Cardiomegaly: Secondary | ICD-10-CM | POA: Diagnosis not present

## 2014-12-03 DIAGNOSIS — E785 Hyperlipidemia, unspecified: Secondary | ICD-10-CM | POA: Diagnosis not present

## 2014-12-03 DIAGNOSIS — Z800 Family history of malignant neoplasm of digestive organs: Secondary | ICD-10-CM | POA: Diagnosis not present

## 2014-12-03 DIAGNOSIS — J189 Pneumonia, unspecified organism: Secondary | ICD-10-CM | POA: Diagnosis present

## 2014-12-03 DIAGNOSIS — H3530 Unspecified macular degeneration: Secondary | ICD-10-CM | POA: Diagnosis not present

## 2014-12-03 DIAGNOSIS — R002 Palpitations: Secondary | ICD-10-CM | POA: Diagnosis not present

## 2014-12-03 DIAGNOSIS — R0989 Other specified symptoms and signs involving the circulatory and respiratory systems: Secondary | ICD-10-CM | POA: Diagnosis not present

## 2014-12-03 DIAGNOSIS — Z803 Family history of malignant neoplasm of breast: Secondary | ICD-10-CM | POA: Diagnosis not present

## 2014-12-03 DIAGNOSIS — F411 Generalized anxiety disorder: Secondary | ICD-10-CM | POA: Diagnosis not present

## 2014-12-03 DIAGNOSIS — F419 Anxiety disorder, unspecified: Secondary | ICD-10-CM | POA: Diagnosis not present

## 2014-12-03 DIAGNOSIS — Z8709 Personal history of other diseases of the respiratory system: Secondary | ICD-10-CM | POA: Diagnosis not present

## 2014-12-03 DIAGNOSIS — R918 Other nonspecific abnormal finding of lung field: Secondary | ICD-10-CM | POA: Diagnosis not present

## 2014-12-03 DIAGNOSIS — Z7901 Long term (current) use of anticoagulants: Secondary | ICD-10-CM | POA: Diagnosis not present

## 2014-12-03 DIAGNOSIS — Z8249 Family history of ischemic heart disease and other diseases of the circulatory system: Secondary | ICD-10-CM | POA: Diagnosis not present

## 2014-12-03 DIAGNOSIS — Z79899 Other long term (current) drug therapy: Secondary | ICD-10-CM | POA: Diagnosis not present

## 2014-12-03 DIAGNOSIS — E782 Mixed hyperlipidemia: Secondary | ICD-10-CM | POA: Diagnosis not present

## 2014-12-03 DIAGNOSIS — M79604 Pain in right leg: Secondary | ICD-10-CM | POA: Diagnosis not present

## 2014-12-03 DIAGNOSIS — I480 Paroxysmal atrial fibrillation: Secondary | ICD-10-CM | POA: Diagnosis not present

## 2014-12-03 DIAGNOSIS — M8580 Other specified disorders of bone density and structure, unspecified site: Secondary | ICD-10-CM | POA: Diagnosis not present

## 2014-12-03 LAB — CBC
HEMATOCRIT: 37 % (ref 35.0–47.0)
HEMATOCRIT: 39.8 % (ref 35.0–47.0)
HEMOGLOBIN: 12.4 g/dL (ref 12.0–16.0)
Hemoglobin: 13 g/dL (ref 12.0–16.0)
MCH: 30.3 pg (ref 26.0–34.0)
MCH: 29.3 pg (ref 26.0–34.0)
MCHC: 33.5 g/dL (ref 32.0–36.0)
MCHC: 32.8 g/dL (ref 32.0–36.0)
MCV: 90.5 fL (ref 80.0–100.0)
MCV: 89.4 fL (ref 80.0–100.0)
PLATELETS: 237 10*3/uL (ref 150–440)
Platelets: 261 10*3/uL (ref 150–440)
RBC: 4.09 MIL/uL (ref 3.80–5.20)
RBC: 4.45 MIL/uL (ref 3.80–5.20)
RDW: 14.9 % — ABNORMAL HIGH (ref 11.5–14.5)
RDW: 14.8 % — AB (ref 11.5–14.5)
WBC: 5.8 10*3/uL (ref 3.6–11.0)
WBC: 7.6 10*3/uL (ref 3.6–11.0)

## 2014-12-03 LAB — BASIC METABOLIC PANEL
Anion gap: 4 — ABNORMAL LOW (ref 5–15)
BUN: 14 mg/dL (ref 6–20)
CALCIUM: 8.8 mg/dL — AB (ref 8.9–10.3)
CO2: 29 mmol/L (ref 22–32)
CREATININE: 0.82 mg/dL (ref 0.44–1.00)
Chloride: 109 mmol/L (ref 101–111)
GFR calc Af Amer: >60 mL/min (ref >60)
GFR calc non Af Amer: >60 mL/min (ref >60)
GLUCOSE: 104 mg/dL — AB (ref 65–99)
Potassium: 4.2 mmol/L (ref 3.5–5.1)
Sodium: 142 mmol/L (ref 135–145)

## 2014-12-03 LAB — ANTITHROMBIN III: AntiThromb III Func: 96 % (ref 75–120)

## 2014-12-03 LAB — COMPREHENSIVE METABOLIC PANEL
ALT: 15 U/L (ref 14–54)
ANION GAP: 6 (ref 5–15)
AST: 14 U/L — AB (ref 15–41)
Albumin: 4.2 g/dL (ref 3.5–5.0)
Alkaline Phosphatase: 85 U/L (ref 38–126)
BILIRUBIN TOTAL: 0.5 mg/dL (ref 0.3–1.2)
BUN: 18 mg/dL (ref 6–20)
CO2: 26 mmol/L (ref 22–32)
Calcium: 9.4 mg/dL (ref 8.9–10.3)
Chloride: 109 mmol/L (ref 101–111)
Creatinine, Ser: 0.84 mg/dL (ref 0.44–1.00)
GFR calc non Af Amer: >60 mL/min (ref >60)
Glucose, Bld: 109 mg/dL — ABNORMAL HIGH (ref 65–99)
POTASSIUM: 3.8 mmol/L (ref 3.5–5.1)
Sodium: 141 mmol/L (ref 135–145)
TOTAL PROTEIN: 7 g/dL (ref 6.5–8.1)

## 2014-12-03 LAB — TROPONIN I: Troponin I: <0.03 ng/mL (ref <0.031)

## 2014-12-03 MED ORDER — APIXABAN 5 MG PO TABS: 5.0000 mg | ORAL_TABLET | Freq: Two times a day (BID) | ORAL | Status: DC

## 2014-12-03 MED ORDER — ONDANSETRON 4 MG PO TBDP
4.0000 mg | ORAL_TABLET | Freq: Once | ORAL | Status: AC | PRN
  Administered 2014-12-03: 4 mg via ORAL

## 2014-12-03 MED ORDER — APIXABAN 5 MG PO TABS
10.0000 mg | ORAL_TABLET | Freq: Two times a day (BID) | ORAL | Status: DC
  Administered 2014-12-03: 10 mg via ORAL
  Filled 2014-12-03: qty 2

## 2014-12-03 MED ORDER — ONDANSETRON 4 MG PO TBDP
ORAL_TABLET | ORAL | Status: AC
  Filled 2014-12-03: qty 1

## 2014-12-03 MED ORDER — APIXABAN 5 MG PO TABS: 10.0000 mg | ORAL_TABLET | Freq: Two times a day (BID) | ORAL | Status: DC

## 2014-12-03 NOTE — ED Notes (Addendum)
Pt was discharged from [facility] today.  Pt returns tonight with nausea. Pt denies abd pain.  Pt reports she has blood clots in her lungs.  Pt denies chest pain or sob.  Pt alert.  Speech clear.

## 2014-12-03 NOTE — Discharge Instructions (Signed)
Information on my medicine - ELIQUIS (apixaban)  This medication education was reviewed with me or my healthcare representative as part of my discharge preparation.  The pharmacist that spoke with me during my hospital stay was:  Jasman Pfeifle G, [place]  Why was Eliquis prescribed for you? Eliquis was prescribed to treat blood clots that may have been found in the veins of your legs (deep vein thrombosis) or in your lungs (pulmonary embolism) and to reduce the risk of them occurring again.  What do You need to know about Eliquis ? The starting dose is 10 mg (two 5 mg tablets) taken TWICE daily for the FIRST SEVEN (7) DAYS, then on (enter date)  12/09/2014  the dose is reduced to ONE 5 mg tablet taken TWICE daily.  Eliquis may be taken with or without food.   Try to take the dose about the same time in the morning and in the evening. If you have difficulty swallowing the tablet whole please discuss with your pharmacist how to take the medication safely.  Take Eliquis exactly as prescribed and DO NOT stop taking Eliquis without talking to the doctor who prescribed the medication.  Stopping may increase your risk of developing a new blood clot.  Refill your prescription before you run out.  After discharge, you should have regular check-up appointments with your healthcare provider that is prescribing your Eliquis.    What do you do if you miss a dose? If a dose of ELIQUIS is not taken at the scheduled time, take it as soon as possible on the same day and twice-daily administration should be resumed. The dose should not be doubled to make up for a missed dose.  Important Safety Information A possible side effect of Eliquis is bleeding. You should call your healthcare provider right away if you experience any of the following: ? Bleeding from an injury or your nose that does not stop. ? Unusual colored urine (red or dark brown) or unusual colored stools (red or black). ? Unusual  bruising for unknown reasons. ? A serious fall or if you hit your head (even if there is no bleeding).  Some medicines may interact with Eliquis and might increase your risk of bleeding or clotting while on Eliquis. To help avoid this, consult your healthcare provider or pharmacist prior to using any new prescription or non-prescription medications, including herbals, vitamins, non-steroidal anti-inflammatory drugs (NSAIDs) and supplements.  This website has more information on Eliquis (apixaban): http://[email or web address].com/eliquis/home

## 2014-12-03 NOTE — Discharge Summary (Signed)
[facility] NAME: Frances Kent    MR#:  [id number]  DATE OF BIRTH:  1930-06-11  DATE OF ADMISSION:  12/02/2014 ADMITTING PHYSICIAN: Aldean Jewett, MD  DATE OF DISCHARGE: 12/03/2014  PRIMARY CARE PHYSICIAN: [name], MD    ADMISSION DIAGNOSIS:  Other acute pulmonary embolism without acute cor pulmonale (HCC) [I26.99]  DISCHARGE DIAGNOSIS:  Active Problems:   Pulmonary embolus ([place])   SECONDARY DIAGNOSIS:   Past Medical History  Diagnosis Date  . GERD (gastroesophageal reflux disease)   . IPF (idiopathic pulmonary fibrosis) ([place]) 20111    by CT, PFTS done by [facility]:   #1 acute pulmonary emboli: Likely had right lower extremity DVT which is now mobilized. She's had palpitations and presyncope over the past few weeks with increasing fatigue. She's noticed her blood pressure running slightly low at home.   Echo cardiogram without any right heart strain.  She is up-to-date on preventative care last mammogram 1 year ago last colonoscopy 5 years ago. No recent trauma to the right leg. No recent prolonged immobility. started her on eliquis.   Follow with PMD.   She remained asymptomatic, and able to walk without any need of support or discomfort.  #2 paroxysmal atrial fibrillation: Currently in normal sinus. Was Not on anticoagulation at home.   Now eliquis, follow with Cardiology.  #3 idiopathic pulmonary fibrosis: No shortness of breath or symptoms at this time. Not on any medications for this at home.  #4 mitral valve prolapse with mitral valve regurgitation:  Follow with cardiologist is Dr. [name].  #5 GERD continue Protonix  #6 anxiety: Continue Xanax that she takes at home as needed  DISCHARGE CONDITIONS:   Stable.  CONSULTS OBTAINED:     DRUG ALLERGIES:  No Known Allergies  DISCHARGE MEDICATIONS:   Current Discharge Medication List    START taking these  medications   Details  !! apixaban (ELIQUIS) 5 MG TABS tablet Take 2 tablets (10 mg total) by mouth 2 (two) times daily. Qty: 13 tablet, Refills: 0    !! apixaban (ELIQUIS) 5 MG TABS tablet Take 1 tablet (5 mg total) by mouth 2 (two) times daily. Qty: 60 tablet, Refills: 1     !! - Potential duplicate medications found. Please discuss with provider.    CONTINUE these medications which have NOT CHANGED   Details  acidophilus (RISAQUAD) CAPS capsule Take 1 capsule by mouth daily.    ALPRAZolam (XANAX) 0.25 MG tablet Take 1 tablet (0.25 mg total) by mouth 2 (two) times daily as needed for anxiety. Qty: 60 tablet, Refills: 0    pantoprazole (PROTONIX) 40 MG tablet Take 40 mg by mouth daily.      STOP taking these medications     aspirin EC 81 MG tablet          DISCHARGE INSTRUCTIONS:    Follow with cardiology in office.  If you experience worsening of your admission symptoms, develop shortness of breath, life threatening emergency, suicidal or homicidal thoughts you must seek medical attention immediately by calling 911 or calling your MD immediately  if symptoms less severe.  You Must read complete instructions/literature along with all the possible adverse reactions/side effects for all the Medicines you take and that have been prescribed to you. Take any new Medicines after you have completely understood and accept all the possible adverse reactions/side effects.   Please note  You were cared for by  a hospitalist during your hospital stay. If you have any questions about your discharge medications or the care you received while you were in the hospital after you are discharged, you can call the unit and asked to speak with the hospitalist on call if the hospitalist that took care of you is not available. Once you are discharged, your primary care physician will handle any further medical issues. Please note that NO REFILLS for any discharge medications will be authorized once  you are discharged, as it is imperative that you return to your primary care physician (or establish a relationship with a primary care physician if you do not have one) for your aftercare needs so that they can reassess your need for medications and monitor your lab values.    Today   CHIEF COMPLAINT:   Chief Complaint  Patient presents with  . Shortness of Breath    HISTORY OF PRESENT ILLNESS:  Frances Kent  is a 81 y.o. female with a known history of idiopathic pulmonary fibrosis, mitral valve prolapse with severe mitral valve regurgitation, SVT, paroxysmal atrial fibrillation not on anticoagulation, reflux and anxiety who presents today due to finding of multiple right-sided pulmonary emboli on CT angiography. She was seen in the emergency room yesterday for right lower extremity pain and positive d-dimer but had a negative venous Doppler at that time. He has had no leg swelling. The pain in her leg has largely resolved. She reports several weeks of feeling fatigued. She states that occasionally, usually in the evening, she feels so bad she could "die". She has palpitations lasting for 3-5 seconds. Her blood pressure has been running low at home. She denies shortness of breath, chest pain or cough. About 2 weeks ago she had an episode of lightheadedness and dizziness and felt presyncopal this resolved on its own. Due to her symptoms of presyncope, fatigue, her history of valvular disorder and arrhythmias and new finding of pulmonary emboli she is being admitted for further evaluation and initiation of anticoagulation.   VITAL SIGNS:  Blood pressure 138/58, pulse 68, temperature 98.1 F (36.7 C), temperature source Oral, resp. rate 20, height 5\' 3"  (1.6 m), weight 60.691 kg (133 lb 12.8 oz), SpO2 100 %.  I/O:   Intake/Output Summary (Last 24 hours) at 12/03/14 1325 Last data filed at 12/03/14 0900  Gross per 24 hour  Intake    480 ml  Output    600 ml  Net   -120 ml    PHYSICAL  EXAMINATION:   GENERAL: 81 y.o.-year-old patient lying in the bed with no acute distress.  EYES: Pupils equal, round, reactive to light and accommodation. No scleral icterus. Extraocular muscles intact.  HEENT: Head atraumatic, normocephalic. Oropharynx and nasopharynx clear. Oral mucous membranes pink and moist. NECK: Supple, no jugular venous distention. No thyroid enlargement, no tenderness.  LUNGS: Normal breath sounds bilaterally, no wheezing, rales, rhonchi or crepitation. No use of accessory muscles of respiration.  CARDIOVASCULAR: S1, S2 normal. No murmurs, rubs, or gallops.  ABDOMEN: Soft, nontender, nondistended. Bowel sounds present. No organomegaly or mass.  EXTREMITIES: No pedal edema, cyanosis, or clubbing. No tenderness. Peripheral pulses 2+ NEUROLOGIC: Cranial nerves II through XII are intact. Muscle strength 5/5 in all extremities. Sensation intact. Gait not checked.  PSYCHIATRIC: The patient is alert and oriented x 3.  SKIN: No obvious rash, lesion, or ulcer.    DATA REVIEW:   CBC  Recent Labs Lab 12/03/14 0401  WBC 5.8  HGB 12.4  HCT 37.0  PLT 237    Chemistries   Recent Labs Lab 12/01/14 1148  12/03/14 0401  NA 143  < > 142  K 5.1  < > 4.2  CL 107  < > 109  CO2 30  < > 29  GLUCOSE 111*  < > 104*  BUN 10  < > 14  CREATININE 0.85  < > 0.82  CALCIUM 9.8  < > 8.8*  AST 15  --   --   ALT 15  --   --   ALKPHOS 88  --   --   BILITOT 0.5  --   --   < > = values in this interval not displayed.  Cardiac Enzymes  Recent Labs Lab 12/02/14 1150  TROPONINI <0.03    Microbiology Results  Results for orders placed or performed in visit on 02/05/13  Urine culture     Status: None   Collection Time: 02/05/13  2:08 PM  Result Value Ref Range Status   Colony Count NO GROWTH  Final   Organism ID, Bacteria NO GROWTH  Final    RADIOLOGY:  Ct Angio Chest W/cm &/or Wo Cm  12/02/2014  CLINICAL DATA:  Shortness of breath for 1 week EXAM: CT  ANGIOGRAPHY CHEST WITH CONTRAST TECHNIQUE: Multidetector CT imaging of the chest was performed using the standard protocol during bolus administration of intravenous contrast. Multiplanar CT image reconstructions and MIPs were obtained to evaluate the vascular anatomy. CONTRAST:  102mL OMNIPAQUE IOHEXOL 350 MG/ML SOLN COMPARISON:  01/02/2013 FINDINGS: Central airways are patent. No mediastinal hematoma or adenopathy. Mild perihilar peribronchial thickening. No hilar adenopathy. Cardiomegaly is noted. Trace posterior pericardial effusion. The visualized upper abdomen shows no adrenal gland mass. The patient is status postcholecystectomy. The study is of excellent technical quality. Nonocclusive pulmonary emboli noted in right upper lobe axial image 56. Nonocclusive pulmonary embolus is noted in right lower lobe axial image 73. The right ventricle over left ventricle ratio is 0.94 without significant right heart strain. Images of the lung parenchyma shows no acute infiltrate or pulmonary edema. Mild atherosclerotic calcifications of thoracic aorta and coronary arteries. Sagittal images of the spine shows degenerative changes and osteopenia thoracic spine. Review of the MIP images confirms the above findings. IMPRESSION: 1. Pulmonary emboli as described above in right upper lobe and right lower lobe. No significant right heart strain. 2. No acute infiltrate or pulmonary edema. 3. Osteopenia and degenerative changes thoracic spine. 4. Mild perihilar peribronchial thickening. These results were called by telephone at the time of interpretation on 12/02/2014 at 10:56 am to Dr. Tommi Rumps , who verbally acknowledged these results. Electronically Signed   By: Lahoma Crocker M.D.   On: 12/02/2014 11:02   US Venous Img Lower Unilateral Right  12/01/2014  CLINICAL DATA:  81 year old female with a history of right lower extremity pain EXAM: RIGHT LOWER EXTREMITY VENOUS DOPPLER ULTRASOUND TECHNIQUE: Gray-scale sonography  with graded compression, as well as color Doppler and duplex ultrasound were performed to evaluate the lower extremity deep venous systems from the level of the common femoral vein and including the common femoral, femoral, profunda femoral, popliteal and calf veins including the posterior tibial, peroneal and gastrocnemius veins when visible. The superficial great saphenous vein was also interrogated. Spectral Doppler was utilized to evaluate flow at rest and with distal augmentation maneuvers in the common femoral, femoral and popliteal veins. COMPARISON:  02/05/2013 FINDINGS: Contralateral Common Femoral Vein: Respiratory phasicity is normal and symmetric with the symptomatic side. Marginal thrombus/ synechiae  along the wall of the left common femoral vein. Vein is compressible. Flow signal maintained. Common Femoral Vein: No evidence of thrombus. Normal compressibility, respiratory phasicity and response to augmentation. Saphenofemoral Junction: No evidence of thrombus. Normal compressibility and flow on color Doppler imaging. Profunda Femoral Vein: No evidence of thrombus. Normal compressibility and flow on color Doppler imaging. Femoral Vein: No evidence of thrombus. Normal compressibility, respiratory phasicity and response to augmentation. Popliteal Vein: No evidence of thrombus. Normal compressibility, respiratory phasicity and response to augmentation. Calf Veins: No evidence of thrombus. Normal compressibility and flow on color Doppler imaging. Superficial Great Saphenous Vein: No evidence of thrombus. Normal compressibility and flow on color Doppler imaging. Other Findings:  None. IMPRESSION: Sonographic survey of the right lower extremity negative for DVT. There is marginal thrombus/scar of the left common femoral vein which is nonocclusive, potentially representing chronic thrombus of indeterminate age, and favored not to be acute. Signed, Dulcy Fanny. Earleen Newport, DO [facility] Radiology Electronically Signed   By: Corrie Mckusick D.O.   On: 12/01/2014 18:18     Management plans discussed with the patient, family and they are in agreement.  CODE STATUS:     Code Status Orders        Start     Ordered   12/02/14 1457  Full code   Continuous     12/02/14 1456    Advance Directive Documentation        Most Recent Value   Type of Advance Directive  Healthcare Power of Attorney   Pre-existing out of facility DNR order (yellow form or pink MOST form)     "MOST" Form in Place?        TOTAL TIME TAKING CARE OF THIS PATIENT: 35 minutes.    Vaughan Basta M.D on 12/03/2014 at 1:25 PM  Between 7am to 6pm - Pager - [phone]  After 6pm go to [email or web address] - password [email or web address] [facility]  Office  [phone]  CC: Primary care physician; [name], MD   Note: This dictation was prepared with Dragon dictation along with smaller phrase technology. Any transcriptional errors that result from this process are unintentional.

## 2014-12-03 NOTE — Care Management Note (Signed)
Case Management Note  Patient Details  Name: Frances Kent MRN: [id number] Date of Birth: Jul 06, 1930  Subjective/Objective:                 Patient presents from home with PE.  Patient lives at home with husband, and uses [facility] on [address].  Plan for patient to discharge on Eliquis.  With patient's permission I have called pharmacy to verify copay cost.  Per pharmacy co pay is $47.     Action/Plan: Patient provided free 30 day card for Eliquis.  Patient states that there will be no financial issue paying for the medication.  RNCM sighing off.    Expected Discharge Date:                  Expected Discharge Plan:     In-House Referral:     Discharge planning Services     Post Acute Care Choice:    Choice offered to:     DME Arranged:    DME Agency:     HH Arranged:    [place] Agency:     Status of Service:     Medicare Important Message Given:    Date Medicare IM Given:    Medicare IM give by:    Date Additional Medicare IM Given:    Additional Medicare Important Message give by:     If discussed at [place] of Stay Meetings, dates discussed:    Additional Comments:  Beverly Sessions, RN 12/03/2014, 1:47 PM

## 2014-12-04 ENCOUNTER — Emergency Department: Payer: Commercial Managed Care - HMO

## 2014-12-04 ENCOUNTER — Encounter: Payer: Self-pay | Admitting: Emergency Medicine

## 2014-12-04 ENCOUNTER — Observation Stay
Admission: EM | Admit: 2014-12-04 | Discharge: 2014-12-05 | Disposition: A | Discharge status: Home / Self Care | Payer: Commercial Managed Care - HMO | Attending: Internal Medicine | Admitting: Internal Medicine

## 2014-12-04 DIAGNOSIS — R0989 Other specified symptoms and signs involving the circulatory and respiratory systems: Secondary | ICD-10-CM | POA: Insufficient documentation

## 2014-12-04 DIAGNOSIS — I2699 Other pulmonary embolism without acute cor pulmonale: Secondary | ICD-10-CM | POA: Insufficient documentation

## 2014-12-04 DIAGNOSIS — R0602 Shortness of breath: Secondary | ICD-10-CM | POA: Insufficient documentation

## 2014-12-04 DIAGNOSIS — F419 Anxiety disorder, unspecified: Secondary | ICD-10-CM | POA: Insufficient documentation

## 2014-12-04 DIAGNOSIS — R110 Nausea: Secondary | ICD-10-CM | POA: Diagnosis not present

## 2014-12-04 DIAGNOSIS — J189 Pneumonia, unspecified organism: Secondary | ICD-10-CM | POA: Diagnosis not present

## 2014-12-04 DIAGNOSIS — Z79899 Other long term (current) drug therapy: Secondary | ICD-10-CM | POA: Insufficient documentation

## 2014-12-04 DIAGNOSIS — I341 Nonrheumatic mitral (valve) prolapse: Secondary | ICD-10-CM | POA: Diagnosis not present

## 2014-12-04 DIAGNOSIS — I340 Nonrheumatic mitral (valve) insufficiency: Secondary | ICD-10-CM | POA: Insufficient documentation

## 2014-12-04 DIAGNOSIS — Z86711 Personal history of pulmonary embolism: Secondary | ICD-10-CM | POA: Insufficient documentation

## 2014-12-04 DIAGNOSIS — J811 Chronic pulmonary edema: Secondary | ICD-10-CM | POA: Diagnosis not present

## 2014-12-04 DIAGNOSIS — I5031 Acute diastolic (congestive) heart failure: Secondary | ICD-10-CM | POA: Diagnosis not present

## 2014-12-04 DIAGNOSIS — Z800 Family history of malignant neoplasm of digestive organs: Secondary | ICD-10-CM | POA: Insufficient documentation

## 2014-12-04 DIAGNOSIS — I480 Paroxysmal atrial fibrillation: Secondary | ICD-10-CM | POA: Insufficient documentation

## 2014-12-04 DIAGNOSIS — Z8709 Personal history of other diseases of the respiratory system: Secondary | ICD-10-CM | POA: Diagnosis not present

## 2014-12-04 DIAGNOSIS — K219 Gastro-esophageal reflux disease without esophagitis: Secondary | ICD-10-CM | POA: Diagnosis not present

## 2014-12-04 DIAGNOSIS — Z803 Family history of malignant neoplasm of breast: Secondary | ICD-10-CM | POA: Insufficient documentation

## 2014-12-04 DIAGNOSIS — M79604 Pain in right leg: Secondary | ICD-10-CM | POA: Insufficient documentation

## 2014-12-04 DIAGNOSIS — R918 Other nonspecific abnormal finding of lung field: Secondary | ICD-10-CM | POA: Insufficient documentation

## 2014-12-04 DIAGNOSIS — I272 Other secondary pulmonary hypertension: Secondary | ICD-10-CM | POA: Insufficient documentation

## 2014-12-04 DIAGNOSIS — R002 Palpitations: Secondary | ICD-10-CM

## 2014-12-04 DIAGNOSIS — M8580 Other specified disorders of bone density and structure, unspecified site: Secondary | ICD-10-CM | POA: Insufficient documentation

## 2014-12-04 DIAGNOSIS — E782 Mixed hyperlipidemia: Secondary | ICD-10-CM | POA: Insufficient documentation

## 2014-12-04 DIAGNOSIS — I517 Cardiomegaly: Secondary | ICD-10-CM | POA: Insufficient documentation

## 2014-12-04 DIAGNOSIS — R5383 Other fatigue: Secondary | ICD-10-CM | POA: Insufficient documentation

## 2014-12-04 DIAGNOSIS — Z8249 Family history of ischemic heart disease and other diseases of the circulatory system: Secondary | ICD-10-CM | POA: Insufficient documentation

## 2014-12-04 DIAGNOSIS — Z7901 Long term (current) use of anticoagulants: Secondary | ICD-10-CM | POA: Insufficient documentation

## 2014-12-04 DIAGNOSIS — Y95 Nosocomial condition: Secondary | ICD-10-CM

## 2014-12-04 HISTORY — DX: Unspecified atrial fibrillation: I48.91

## 2014-12-04 HISTORY — DX: Nonrheumatic mitral (valve) insufficiency: I34.0

## 2014-12-04 HISTORY — DX: Other pulmonary embolism without acute cor pulmonale: I26.99

## 2014-12-04 LAB — URINALYSIS COMPLETE WITH MICROSCOPIC (ARMC ONLY)
BILIRUBIN URINE: NEGATIVE
Bacteria, UA: NONE SEEN
GLUCOSE, UA: NEGATIVE mg/dL
Hgb urine dipstick: NEGATIVE
KETONES UR: NEGATIVE mg/dL
Leukocytes, UA: NEGATIVE
Nitrite: NEGATIVE
Protein, ur: NEGATIVE mg/dL
Specific Gravity, Urine: 1.009 (ref 1.005–1.030)
WBC, UA: NONE SEEN WBC/hpf (ref 0–5)
pH: 7 (ref 5.0–8.0)

## 2014-12-04 LAB — TROPONIN I: Troponin I: <0.03 ng/mL (ref <0.031)

## 2014-12-04 LAB — BASIC METABOLIC PANEL
Anion gap: 6 (ref 5–15)
BUN: 16 mg/dL (ref 6–20)
CHLORIDE: 108 mmol/L (ref 101–111)
CO2: 28 mmol/L (ref 22–32)
Calcium: 9.4 mg/dL (ref 8.9–10.3)
Creatinine, Ser: 0.86 mg/dL (ref 0.44–1.00)
Glucose, Bld: 93 mg/dL (ref 65–99)
POTASSIUM: 3.7 mmol/L (ref 3.5–5.1)
SODIUM: 142 mmol/L (ref 135–145)

## 2014-12-04 LAB — CARDIOLIPIN ANTIBODIES, IGG, IGM, IGA
Anticardiolipin IgA: <9 APL U/mL (ref 0–11)
Anticardiolipin IgG: <9 GPL U/mL (ref 0–14)
Anticardiolipin IgM: <9 MPL U/mL (ref 0–12)

## 2014-12-04 LAB — CBC
HEMATOCRIT: 40.6 % (ref 35.0–47.0)
Hemoglobin: 13.5 g/dL (ref 12.0–16.0)
MCH: 29.8 pg (ref 26.0–34.0)
MCHC: 33.1 g/dL (ref 32.0–36.0)
MCV: 90 fL (ref 80.0–100.0)
PLATELETS: 260 10*3/uL (ref 150–440)
RBC: 4.52 MIL/uL (ref 3.80–5.20)
RDW: 14.6 % — AB (ref 11.5–14.5)
WBC: 6 10*3/uL (ref 3.6–11.0)

## 2014-12-04 LAB — LUPUS ANTICOAGULANT PANEL
DRVVT: 65.4 s — AB (ref 0.0–55.1)
PTT LA: 42.7 s (ref 0.0–50.0)

## 2014-12-04 LAB — PROTEIN S, TOTAL: PROTEIN S AG TOTAL: 136 % (ref 58–150)

## 2014-12-04 LAB — PROTEIN C, TOTAL: Protein C, Total: 98 % (ref 70–140)

## 2014-12-04 LAB — BRAIN NATRIURETIC PEPTIDE: B Natriuretic Peptide: 123 pg/mL — ABNORMAL HIGH (ref 0.0–100.0)

## 2014-12-04 LAB — T4, FREE: FREE T4: 0.92 ng/dL (ref 0.61–1.12)

## 2014-12-04 LAB — MAGNESIUM: MAGNESIUM: 2.2 mg/dL (ref 1.7–2.4)

## 2014-12-04 LAB — TSH: TSH: 6.2 u[IU]/mL — AB (ref 0.350–4.500)

## 2014-12-04 LAB — DRVVT CONFIRM: DRVVT CONFIRM: 1 ratio (ref 0.0–1.4)

## 2014-12-04 LAB — PROTEIN C ACTIVITY: Protein C Activity: 175 % — ABNORMAL HIGH (ref 74–151)

## 2014-12-04 LAB — PROTEIN S ACTIVITY: PROTEIN S ACTIVITY: 97 % (ref 60–145)

## 2014-12-04 LAB — DRVVT MIX: DRVVT MIX: 57.6 s — AB (ref 0.0–45.4)

## 2014-12-04 MED ORDER — APIXABAN 5 MG PO TABS
10.0000 mg | ORAL_TABLET | Freq: Two times a day (BID) | ORAL | Status: DC
  Administered 2014-12-04 – 2014-12-05 (×3): 10 mg via ORAL
  Filled 2014-12-04 (×3): qty 2

## 2014-12-04 MED ORDER — ALPRAZOLAM 0.25 MG PO TABS: 0.2500 mg | ORAL_TABLET | Freq: Two times a day (BID) | ORAL | Status: DC | PRN

## 2014-12-04 MED ORDER — PIPERACILLIN-TAZOBACTAM 3.375 G IVPB
3.3750 g | Freq: Once | INTRAVENOUS | Status: AC
  Administered 2014-12-04: 3.375 g via INTRAVENOUS
  Filled 2014-12-04: qty 50

## 2014-12-04 MED ORDER — FUROSEMIDE 10 MG/ML IJ SOLN
20.0000 mg | Freq: Once | INTRAMUSCULAR | Status: AC
  Administered 2014-12-04: 20 mg via INTRAVENOUS
  Filled 2014-12-04: qty 4

## 2014-12-04 MED ORDER — APIXABAN 5 MG PO TABS: 5.0000 mg | ORAL_TABLET | Freq: Two times a day (BID) | ORAL | Status: DC

## 2014-12-04 MED ORDER — PANTOPRAZOLE SODIUM 40 MG PO TBEC
40.0000 mg | DELAYED_RELEASE_TABLET | Freq: Every day | ORAL | Status: DC
  Administered 2014-12-04 – 2014-12-05 (×2): 40 mg via ORAL
  Filled 2014-12-04 (×2): qty 1

## 2014-12-04 MED ORDER — VANCOMYCIN HCL IN DEXTROSE 1-5 GM/200ML-% IV SOLN
1000.0000 mg | Freq: Once | INTRAVENOUS | Status: AC
  Administered 2014-12-04: 1000 mg via INTRAVENOUS
  Filled 2014-12-04: qty 200

## 2014-12-04 NOTE — H&P (Signed)
[facility] NAME: Frances Kent    MR#:  [id number]  DATE OF BIRTH:  [birth year]  DATE OF ADMISSION:  12/04/2014  PRIMARY CARE PHYSICIAN: [name], MD   REQUESTING/REFERRING PHYSICIAN: Owens Shark  CHIEF COMPLAINT:   Chief Complaint  Patient presents with  . Fatigue  . Nausea  . Tachycardia    HISTORY OF PRESENT ILLNESS: Frances Kent  is a 81 y.o. female with a known history of mitral regurgitation, paroxysmal atrial fibrillation, recently diagnosed pulmonary embolism and was discharged from hospital yesterday on atelectasis, gastroesophageal reflux disease, anxiety. She was feeling completely fine and pleasant, back to her baseline yesterday when I discharged her. She said last night she started feeling nausea and palpitation in her chest and came to emergency room, stayed for 3 hours and emergency room but is everything was under control and within normal limits she was discharged back home. Today morning she again started having same symptoms with brief episode of palpitation and feeling like she will die so her daughter brought her to emergency room again. In ER on workup she was noted to have new onset pulmonary edema but her heart rate in vitals are under control, hospitalist service was contacted for further management of this case. Further questioning she denies any fever or cough, chills or shortness of breath.  PAST MEDICAL HISTORY:   Past Medical History  Diagnosis Date  . GERD (gastroesophageal reflux disease)   . IPF (idiopathic pulmonary fibrosis) ([place]) 20111    by CT, PFTS done by Humphrey Rolls  . Pulmonary embolism ([place])   . Atrial fibrillation ([place])   . Mitral regurgitation     PAST SURGICAL HISTORY:  Past Surgical History  Procedure Laterality Date  . Appendectomy  1960  . Cholecystectomy  1985  . Ovarian cyst removal      SOCIAL HISTORY:  Social History  Substance Use Topics  . Smoking status: Never  Smoker   . Smokeless tobacco: Never Used     Comment: 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 , worked at [employer], [employer]  . Alcohol Use: No    FAMILY HISTORY:  Family History  Problem Relation Age of Onset  . Cancer Mother 56    breast cancer, lived to 25,   . Heart disease Maternal Grandmother 35    died of massive MI  . Cancer Son 40    pancreatic cancer  . Heart disease Son     CAD, Tobacco Abuse     DRUG ALLERGIES: No Known Allergies  REVIEW OF SYSTEMS:   CONSTITUTIONAL: No fever, fatigue or weakness.  EYES: No blurred or double vision.  EARS, NOSE, AND THROAT: No tinnitus or ear pain.  RESPIRATORY: No cough, shortness of breath, wheezing or hemoptysis.  CARDIOVASCULAR: No chest pain, orthopnea, edema. Had palpitation. GASTROINTESTINAL: positive for nausea,  No vomiting, diarrhea or abdominal pain.  GENITOURINARY: No dysuria, hematuria.  ENDOCRINE: No polyuria, nocturia,  HEMATOLOGY: No anemia, easy bruising or bleeding SKIN: No rash or lesion. MUSCULOSKELETAL: No joint pain or arthritis.   NEUROLOGIC: No tingling, numbness, weakness.  PSYCHIATRY: No anxiety or depression.   MEDICATIONS AT HOME:  Prior to Admission medications   Medication Sig Start Date End Date Taking? Authorizing Provider  acidophilus (RISAQUAD) CAPS capsule Take 1 capsule by mouth daily.   Yes Historical Provider, MD  ALPRAZolam (XANAX) 0.25 MG tablet Take 1 tablet (0.25 mg total) by mouth 2 (two) times daily as needed for anxiety. 04/02/14  Yes [name], MD  apixaban (ELIQUIS) 5 MG TABS tablet Take 2 tablets (10 mg total) by mouth 2 (two) times daily. 12/03/14 12/09/14 Yes Vaughan Basta, MD  apixaban (ELIQUIS) 5 MG TABS tablet Take 1 tablet (5 mg total) by mouth 2 (two) times daily. Patient taking differently: Take 5 mg by mouth 2 (two) times daily.  12/10/14  Yes Vaughan Basta, MD  pantoprazole (PROTONIX) 40 MG tablet Take 40 mg by mouth daily.   Yes Historical Provider, MD       PHYSICAL EXAMINATION:   VITAL SIGNS: Blood pressure 131/55, pulse 54, temperature 98.6 F (37 C), temperature source Oral, resp. rate 12, height 5\' 3"  (1.6 m), weight 61.236 kg (135 lb), SpO2 98 %.  GENERAL:  81 y.o.-year-old patient lying in the bed with no acute distress.  EYES: Pupils equal, round, reactive to light and accommodation. No scleral icterus. Extraocular muscles intact.  HEENT: Head atraumatic, normocephalic. Oropharynx and nasopharynx clear.  NECK:  Supple, no jugular venous distention. No thyroid enlargement, no tenderness.  LUNGS: Normal breath sounds bilaterally, no wheezing, Mild crepitation on both lung bases. No use of accessory muscles of respiration.  CARDIOVASCULAR: S1, S2 normal. No murmurs, rubs, or gallops.  ABDOMEN: Soft, nontender, nondistended. Bowel sounds present. No organomegaly or mass.  EXTREMITIES: No pedal edema, cyanosis, or clubbing.  NEUROLOGIC: Cranial nerves II through XII are intact. Muscle strength 5/5 in all extremities. Sensation intact. Gait not checked.  PSYCHIATRIC: The patient is alert and oriented x 3.  SKIN: No obvious rash, lesion, or ulcer.   LABORATORY PANEL:   CBC  Recent Labs Lab 12/02/14 1150 12/03/14 0401 12/03/14 2124 12/04/14 0555  WBC 5.8 5.8 7.6 6.0  HGB 13.6 12.4 13.0 13.5  HCT 42.0 37.0 39.8 40.6  PLT 272 237 261 260  MCV 90.9 90.5 89.4 90.0  MCH 29.4 30.3 29.3 29.8  MCHC 32.3 33.5 32.8 33.1  RDW 14.9* 14.9* 14.8* 14.6*   ------------------------------------------------------------------------------------------------------------------  Chemistries   Recent Labs Lab 12/01/14 1148 12/02/14 1150 12/03/14 0401 12/03/14 2124 12/04/14 0555  NA 143 141 142 141 142  K 5.1 4.0 4.2 3.8 3.7  CL 107 106 109 109 108  CO2 30 28 29 26 28   GLUCOSE 111* 95 104* 109* 93  BUN 10 13 14 18 16   CREATININE 0.85 0.82 0.82 0.84 0.86  CALCIUM 9.8 9.3 8.8* 9.4 9.4  MG  --   --   --   --  2.2  AST 15  --   --  14*  --    ALT 15  --   --  15  --   ALKPHOS 88  --   --  85  --   BILITOT 0.5  --   --  0.5  --    ------------------------------------------------------------------------------------------------------------------ estimated creatinine clearance is 41.7 mL/min (by C-G formula based on Cr of 0.86). ------------------------------------------------------------------------------------------------------------------  Recent Labs  12/04/14 0555  TSH 6.200*     Coagulation profile  Recent Labs Lab 12/02/14 1150  INR 0.94   -------------------------------------------------------------------------------------------------------------------  Recent Labs  12/01/14 1148  DDIMER 2.27*   -------------------------------------------------------------------------------------------------------------------  Cardiac Enzymes  Recent Labs Lab 12/02/14 1150 12/03/14 2124 12/04/14 0555  TROPONINI <0.03 <0.03 <0.03   ------------------------------------------------------------------------------------------------------------------ Invalid input(s): POCBNP  ---------------------------------------------------------------------------------------------------------------  Urinalysis    Component Value Date/Time   COLORURINE Straw 02/05/2013 1630   COLORURINE YELLOW 10/12/2011 1152   APPEARANCEUR Clear 02/05/2013 [place] 10/12/2011 1152   LABSPEC 1.009 02/05/2013 1630  LABSPEC 1.022 10/12/2011 1152   PHURINE 7.0 02/05/2013 1630   PHURINE 6.5 10/12/2011 1152   GLUCOSEU Negative 02/05/2013 1630   GLUCOSEU NEG 10/12/2011 1152   HGBUR 1+ 02/05/2013 1630   HGBUR NEG 10/12/2011 1152   BILIRUBINUR Negative 02/05/2013 1630   BILIRUBINUR neg 02/05/2013 1338   BILIRUBINUR NEG 10/12/2011 1152   KETONESUR Trace 02/05/2013 1630   KETONESUR NEG 10/12/2011 1152   PROTEINUR Negative 02/05/2013 1630   PROTEINUR neg 02/05/2013 1338   PROTEINUR NEG 10/12/2011 1152   UROBILINOGEN 0.2  02/05/2013 1338   UROBILINOGEN 0.2 10/12/2011 1152   NITRITE Negative 02/05/2013 1630   NITRITE neg 02/05/2013 1338   NITRITE NEG 10/12/2011 1152   LEUKOCYTESUR 1+ 02/05/2013 1630   LEUKOCYTESUR Negative 02/05/2013 1338     RADIOLOGY: Ct Angio Chest W/cm &/or Wo Cm  12/02/2014  CLINICAL DATA:  Shortness of breath for 1 week EXAM: CT ANGIOGRAPHY CHEST WITH CONTRAST TECHNIQUE: Multidetector CT imaging of the chest was performed using the standard protocol during bolus administration of intravenous contrast. Multiplanar CT image reconstructions and MIPs were obtained to evaluate the vascular anatomy. CONTRAST:  54mL OMNIPAQUE IOHEXOL 350 MG/ML SOLN COMPARISON:  01/02/2013 FINDINGS: Central airways are patent. No mediastinal hematoma or adenopathy. Mild perihilar peribronchial thickening. No hilar adenopathy. Cardiomegaly is noted. Trace posterior pericardial effusion. The visualized upper abdomen shows no adrenal gland mass. The patient is status postcholecystectomy. The study is of excellent technical quality. Nonocclusive pulmonary emboli noted in right upper lobe axial image 56. Nonocclusive pulmonary embolus is noted in right lower lobe axial image 73. The right ventricle over left ventricle ratio is 0.94 without significant right heart strain. Images of the lung parenchyma shows no acute infiltrate or pulmonary edema. Mild atherosclerotic calcifications of thoracic aorta and coronary arteries. Sagittal images of the spine shows degenerative changes and osteopenia thoracic spine. Review of the MIP images confirms the above findings. IMPRESSION: 1. Pulmonary emboli as described above in right upper lobe and right lower lobe. No significant right heart strain. 2. No acute infiltrate or pulmonary edema. 3. Osteopenia and degenerative changes thoracic spine. 4. Mild perihilar peribronchial thickening. These results were called by telephone at the time of interpretation on 12/02/2014 at 10:56 am to Dr. Tommi Rumps , who verbally acknowledged these results. Electronically Signed   By: Lahoma Crocker M.D.   On: 12/02/2014 11:02   Dg Chest Port 1 View  12/04/2014  CLINICAL DATA:  Feeling bad, nausea. Known lung clots, nonsmoker. History of the idiopathic pulmonary fibrosis. EXAM: PORTABLE CHEST 1 VIEW COMPARISON:  Chest radiograph February 06, 2013 FINDINGS: The cardiac silhouette remains mildly enlarged. Calcified aortic knob. Pulmonary vascular congestion, patchy bibasilar a and LEFT midlung zone airspace opacity. No pleural effusions. No pneumothorax. Soft tissue planes and included osseous structure nonsuspicious. Surgical clips in the included right abdomen compatible with cholecystectomy. IMPRESSION: Mild cardiomegaly and pulmonary vascular congestion. Patchy LEFT midlung zone and bibasilar airspace opacities may represent confluent edema and/or pneumonia. Followup PA and lateral chest X-ray is recommended in 3-4 weeks following trial of antibiotic therapy to ensure resolution and exclude underlying malignancy. Electronically Signed   By: Elon Alas M.D.   On: 12/04/2014 06:28    IMPRESSION AND PLAN:  * Palpitation  Patient has paroxysmal atrial fibrillation, I strongly feel this might be an episode of cardiac arrhythmia.  Currently she is in sinus rhythm and without any discomfort.  As it happened twice last night I'll admit her on observation on telemetry,  her echocardiogram is done yesterday.  I will call Dr. [name] for further management.  She is already on atelectasis due to her pulmonary embolism.  * Acute diastolic congestive heart failure  Patient has some evidence of pulmonary edema on chest x-ray and she had crepitations.  I'll give one dose of IV Lasix, then monitor symptomatically.  * Recent pulmonary embolism  Patient is not short of breath, hypotensive.  She is already started on Altace yesterday, continue for now.  * Anxiety  Continue Xanax.  * Depression esophageal  reflux disease  Continue pantoprazole.  All the records are reviewed and case discussed with ED provider. Management plans discussed with the patient, family and they are in agreement.  CODE STATUS: Advance Directive Documentation        Most Recent Value   Type of Advance Directive  Healthcare Power of Attorney, Living will   Pre-existing out of facility DNR order (yellow form or pink MOST form)     "MOST" Form in Place?         TOTAL TIME TAKING CARE OF THIS PATIENT: 50 minutes.  I spoke to patient and her daughter present in the room and called Dr. [name] and spoke to him on phone about the patient.  Vaughan Basta M.D on 12/04/2014   Between 7am to 6pm - Pager - [phone]  After 6pm go to [email or web address] - password [email or web address] [place] [facility]  Office  [phone]  CC: Primary care physician; [name], MD   Note: This dictation was prepared with Dragon dictation along with smaller phrase technology. Any transcriptional errors that result from this process are unintentional.

## 2014-12-04 NOTE — ED Notes (Signed)
Patient states "I just don't feel good". Reports being told she has 3 clots in lungs after CT this past week. States she has felt nauseated as well. Complains of pain to right lower leg as well. States she has had tachycardia for "just a few minutes" last couple of days. Was admitted to hospital recently on Heparin drip.

## 2014-12-04 NOTE — ED Provider Notes (Signed)
[facility] Emergency Department Provider Note  ____________________________________________  Time seen: 6:15 AM  I have reviewed the triage vital signs and the nursing notes.   HISTORY  Chief Complaint Fatigue; Nausea; and Tachycardia     HPI Frances Kent is a 81 y.o. female presents with "I just feel horrible and weak". Patient states that approximately one hour ago she had an episode of "my heart racing". Patient denies any pain dyspnea or dizziness during the event. She stated that even lasted for a few minutes and then spontaneously resolved.  Of note the patient was recently diagnosed with multiple pulmonary emboli is in her right lung for which she is currently taking Eloquis.    Past Medical History  Diagnosis Date  . GERD (gastroesophageal reflux disease)   . IPF (idiopathic pulmonary fibrosis) ([place]) 20111    by CT, PFTS done by Humphrey Rolls  . Pulmonary embolism [facility])     Patient Active Problem List   Diagnosis Date Noted  . Pulmonary embolus ([place]) 12/02/2014  . Pain of right lower extremity 12/01/2014  . Insomnia 10/04/2014  . PAF (paroxysmal atrial fibrillation) ([place]) 04/03/2014  . Skin lesion of face 12/21/2013  . Achilles tendinosis 12/21/2013  . Generalized anxiety disorder 03/28/2013  . Mitral valve prolapse 03/28/2013  . Medicare annual wellness visit, subsequent 03/23/2012  . IPF (idiopathic pulmonary fibrosis) ([place])   . Screening for breast cancer 11/29/2010  . Macular degeneration, left eye 11/29/2010  . Screening for colon cancer 11/29/2010  . Hyperlipidemia LDL goal <160 11/29/2010  . GERD (gastroesophageal reflux disease)     Past Surgical History  Procedure Laterality Date  . Appendectomy  1960  . Cholecystectomy  1985  . Ovarian cyst removal      Current Outpatient Rx  Name  Route  Sig  Dispense  Refill  . acidophilus (RISAQUAD) CAPS capsule   Oral   Take 1 capsule by mouth daily.         Marland Kitchen ALPRAZolam (XANAX)  0.25 MG tablet   Oral   Take 1 tablet (0.25 mg total) by mouth 2 (two) times daily as needed for anxiety.   60 tablet   0   . apixaban (ELIQUIS) 5 MG TABS tablet   Oral   Take 2 tablets (10 mg total) by mouth 2 (two) times daily.   13 tablet   0   . apixaban (ELIQUIS) 5 MG TABS tablet   Oral   Take 1 tablet (5 mg total) by mouth 2 (two) times daily. Patient taking differently: Take 5 mg by mouth 2 (two) times daily.    60 tablet   1   . pantoprazole (PROTONIX) 40 MG tablet   Oral   Take 40 mg by mouth daily.           Allergies Review of patient's allergies indicates no known allergies.  Family History  Problem Relation Age of Onset  . Cancer Mother 68    breast cancer, lived to 41,   . Heart disease Maternal Grandmother 53    died of massive MI  . Cancer Son 52    pancreatic cancer  . Heart disease Son     CAD, Tobacco Abuse     Social History Social History  Substance Use Topics  . Smoking status: Never Smoker   . Smokeless tobacco: Never Used     Comment: 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 , worked at [employer], [employer]  . Alcohol Use: No    Review of Systems  Constitutional: Negative for fever. Eyes: Negative for visual changes. ENT: Negative for sore throat. Cardiovascular: Positive for chest pain. Respiratory: Positive for shortness of breath. Gastrointestinal: Negative for abdominal pain, vomiting and diarrhea. Genitourinary: Negative for dysuria. Musculoskeletal: Negative for back pain. Skin: Negative for rash. Neurological: Negative for headaches, focal weakness or numbness.   10-point ROS otherwise negative.  ____________________________________________   PHYSICAL EXAM:  VITAL SIGNS: ED Triage Vitals  Enc Vitals Group     BP 12/04/14 0547 138/57 mmHg     Pulse Rate 12/04/14 0542 69     Resp 12/04/14 0542 16     Temp 12/04/14 0542 98.6 F (37 C)     Temp Source 12/04/14 0542 Oral     SpO2 12/04/14 0542 97 %     Weight 12/04/14 0542 135 lb  (61.236 kg)     Height 12/04/14 0542 5\' 3"  (1.6 m)     Head Cir --      Peak Flow --      Pain Score 12/04/14 0543 0     Pain Loc --      Pain Edu? --      Excl. in [place]? --      Constitutional: Alert and oriented. Well appearing and in no distress. Eyes: Conjunctivae are normal. PERRL. Normal extraocular movements. ENT   Head: Normocephalic and atraumatic.   Nose: No congestion/rhinnorhea.   Mouth/Throat: Mucous membranes are moist.   Neck: No stridor. Hematological/Lymphatic/Immunilogical: No cervical lymphadenopathy. Cardiovascular: Normal rate, regular rhythm. Normal and symmetric distal pulses are present in all extremities. No murmurs, rubs, or gallops. Respiratory: Normal respiratory effort without tachypnea nor retractions. Breath sounds are clear and equal bilaterally. No wheezes/rales/rhonchi. Gastrointestinal: Soft and nontender. No distention. There is no CVA tenderness. Genitourinary: deferred Musculoskeletal: Nontender with normal range of motion in all extremities. No joint effusions.  No lower extremity tenderness nor edema. Neurologic:  Normal speech and language. No gross focal neurologic deficits are appreciated. Speech is normal.  Skin:  Skin is warm, dry and intact. No rash noted. Psychiatric: Mood and affect are normal. Speech and behavior are normal. Patient exhibits appropriate insight and judgment.  ____________________________________________    LABS (pertinent positives/negatives) Labs Reviewed  CBC - Abnormal; Notable for the following:    RDW 14.6 (*)    All other components within normal limits  TSH - Abnormal; Notable for the following:    TSH 6.200 (*)    All other components within normal limits  URINALYSIS COMPLETEWITH MICROSCOPIC ([facility] ONLY) - Abnormal; Notable for the following:    Color, Urine YELLOW (*)    APPearance CLEAR (*)    Squamous Epithelial / LPF 0-5 (*)    All other components within normal limits  BRAIN  NATRIURETIC PEPTIDE - Abnormal; Notable for the following:    B Natriuretic Peptide 123.0 (*)    All other components within normal limits  BASIC METABOLIC PANEL - Abnormal; Notable for the following:    Glucose, Bld 102 (*)    GFR calc non Af Amer 60 (*)    All other components within normal limits  CBC - Abnormal; Notable for the following:    RDW 14.7 (*)    All other components within normal limits  BASIC METABOLIC PANEL  TROPONIN I  MAGNESIUM  T3, FREE  T4, FREE       EKG  ED ECG REPORT I, BROWN, [place] N, the attending physician, personally viewed and interpreted this ECG.   Date: 12/04/2014  EKG  Time: 5:45 AM  Rate: 70  Rhythm:normal sinus rhythm Axis: none   Intervals:None  ST&T Change: none   ____________________________________________    RADIOLOGY     DG Chest Port 1 View (Final result) Result time: 12/04/14 42:10:31   Final result by Rad Results In Interface (12/04/14 28:11:88)   Narrative:   CLINICAL DATA: Feeling bad, nausea. Known lung clots, nonsmoker. History of the idiopathic pulmonary fibrosis.  EXAM: PORTABLE CHEST 1 VIEW  COMPARISON: Chest radiograph February 06, 2013  FINDINGS: The cardiac silhouette remains mildly enlarged. Calcified aortic knob. Pulmonary vascular congestion, patchy bibasilar a and LEFT midlung zone airspace opacity. No pleural effusions. No pneumothorax. Soft tissue planes and included osseous structure nonsuspicious. Surgical clips in the included right abdomen compatible with cholecystectomy.  IMPRESSION: Mild cardiomegaly and pulmonary vascular congestion.  Patchy LEFT midlung zone and bibasilar airspace opacities may represent confluent edema and/or pneumonia. Followup PA and lateral chest X-ray is recommended in 3-4 weeks following trial of antibiotic therapy to ensure resolution and exclude underlying malignancy.   Electronically Signed By: Elon Alas M.D. On: 12/04/2014 06:28        INITIAL IMPRESSION / ASSESSMENT AND PLAN / ED COURSE  Pertinent labs & imaging results that were available during my care of the patient were reviewed by me and considered in my medical decision making (see chart for details).  Given recent hospital admission and CT scan findings concerning for pneumonia patient treated for hospital-acquired pneumonia  ____________________________________________   FINAL CLINICAL IMPRESSION(S) / ED DIAGNOSES  Final diagnoses:  Hospital-acquired pneumonia      Gregor Hams, MD 12/09/14 [phone]

## 2014-12-04 NOTE — Consult Note (Signed)
[facility] Consultation Note  Patient ID: Frances Kent, MRN: [id number], DOB/AGE: Oct 02, 1930 81 y.o. Admit date: 12/04/2014   Date of Consult: 12/04/2014 Primary Physician: [name], MD Primary Cardiologist: [name]  Chief Complaint:  Chief Complaint  Patient presents with  . Fatigue  . Nausea  . Tachycardia   Reason for Consult: acute weakness with known mitral valve disease pulmonary hypertension makes hyperlipidemia  HPI: 81 y.o. female with the known mitral valve prolapse with regurgitation mixed hyperlipidemia and paroxysmal nonvalvular atrial fibrillation for which the patient has had full cardiovascular workup earlier this year with a walking treadmill test without evidence of induction of rhythm disturbances and or myocardial ischemia. The patient also had an echocardiogram showing normal LV systolic function with mild to moderate pulmonary hypertension. Recently the patient has had acute pulmonary embolism of unknown source for which she was placed on appropriate medication management. She has been on anticoagulation at this time. The patient was sent home and then had a weak spell for which she felt weak fatigue and slightly dizzy. This was correlating to had in the last many months. Currently the EKG has shown normal sinus rhythm without evidence of EKG changes or myocardial infarction with a normal troponin. There is no evidence of chest discomfort at this time or consistent concerned symptoms of myocardial infarction. The patient did have a chest x-ray suggestive of pulmonary vascular congestion and acute diastolic dysfunction heart failure  Past Medical History  Diagnosis Date  . GERD (gastroesophageal reflux disease)   . IPF (idiopathic pulmonary fibrosis) ([place]) 20111    by CT, PFTS done by Humphrey Rolls  . Pulmonary embolism ([place])   . Atrial fibrillation ([place])   . Mitral regurgitation       Surgical History:  Past Surgical History  Procedure  Laterality Date  . Appendectomy  1960  . Cholecystectomy  1985  . Ovarian cyst removal       Home Meds: Prior to Admission medications   Medication Sig Start Date End Date Taking? Authorizing Provider  acidophilus (RISAQUAD) CAPS capsule Take 1 capsule by mouth daily.   Yes Historical Provider, MD  ALPRAZolam (XANAX) 0.25 MG tablet Take 1 tablet (0.25 mg total) by mouth 2 (two) times daily as needed for anxiety. 04/02/14  Yes [name], MD  apixaban (ELIQUIS) 5 MG TABS tablet Take 2 tablets (10 mg total) by mouth 2 (two) times daily. 12/03/14 12/09/14 Yes Vaughan Basta, MD  apixaban (ELIQUIS) 5 MG TABS tablet Take 1 tablet (5 mg total) by mouth 2 (two) times daily. Patient taking differently: Take 5 mg by mouth 2 (two) times daily.  12/10/14  Yes Vaughan Basta, MD  pantoprazole (PROTONIX) 40 MG tablet Take 40 mg by mouth daily.   Yes Historical Provider, MD    Inpatient Medications:  . apixaban  10 mg Oral BID  . pantoprazole  40 mg Oral Daily  . piperacillin-tazobactam (ZOSYN)  IV  3.375 g Intravenous Once      Allergies: No Known Allergies  Social History   Social History  . Marital Status: Widowed    Spouse Name: N/A  . Number of Children: N/A  . Years of Education: N/A   Occupational History  . Not on file.   Social History Main Topics  . Smoking status: Never Smoker   . Smokeless tobacco: Never Used     Comment: 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 , worked at [employer], [employer]  . Alcohol Use: No  . Drug Use: No  .  Sexual Activity: Not on file   Other Topics Concern  . Not on file   Social History Narrative   Has caretaking responsibility for 3 young grandchildren who live with her     Family History  Problem Relation Age of Onset  . Cancer Mother 25    breast cancer, lived to 78,   . Heart disease Maternal Grandmother 2    died of massive MI  . Cancer Son 67    pancreatic cancer  . Heart disease Son     CAD, Tobacco Abuse      Review of  Systems Positive for weakness fatigue shortness of breath Negative for: General:  chills, fever, night sweats or weight changes.  Cardiovascular: PND orthopnea syncope dizziness  Dermatological skin lesions rashes Respiratory: Cough congestion Urologic: Frequent urination urination at night and hematuria Abdominal: negative for nausea, vomiting, diarrhea, bright red blood per rectum, melena, or hematemesis Neurologic: negative for visual changes, and/or hearing changes  All other systems reviewed and are otherwise negative except as noted above.  Labs:  Recent Labs  12/02/14 1150 12/03/14 2124 12/04/14 0555  TROPONINI <0.03 <0.03 <0.03   Lab Results  Component Value Date   WBC 6.0 12/04/2014   HGB 13.5 12/04/2014   HCT 40.6 12/04/2014   MCV 90.0 12/04/2014   PLT 260 12/04/2014    Recent Labs Lab 12/03/14 2124 12/04/14 0555  NA 141 142  K 3.8 3.7  CL 109 108  CO2 26 28  BUN 18 16  CREATININE 0.84 0.86  CALCIUM 9.4 9.4  PROT 7.0  --   BILITOT 0.5  --   ALKPHOS 85  --   ALT 15  --   AST 14*  --   GLUCOSE 109* 93   Lab Results  Component Value Date   CHOL 274* 04/02/2014   HDL 79.90 04/02/2014   LDLCALC 171* 04/02/2014   TRIG 114.0 04/02/2014   Lab Results  Component Value Date   DDIMER 2.27* 12/01/2014    Radiology/Studies:  Ct Angio Chest W/cm &/or Wo Cm  12/02/2014  CLINICAL DATA:  Shortness of breath for 1 week EXAM: CT ANGIOGRAPHY CHEST WITH CONTRAST TECHNIQUE: Multidetector CT imaging of the chest was performed using the standard protocol during bolus administration of intravenous contrast. Multiplanar CT image reconstructions and MIPs were obtained to evaluate the vascular anatomy. CONTRAST:  34mL OMNIPAQUE IOHEXOL 350 MG/ML SOLN COMPARISON:  01/02/2013 FINDINGS: Central airways are patent. No mediastinal hematoma or adenopathy. Mild perihilar peribronchial thickening. No hilar adenopathy. Cardiomegaly is noted. Trace posterior pericardial effusion.  The visualized upper abdomen shows no adrenal gland mass. The patient is status postcholecystectomy. The study is of excellent technical quality. Nonocclusive pulmonary emboli noted in right upper lobe axial image 56. Nonocclusive pulmonary embolus is noted in right lower lobe axial image 73. The right ventricle over left ventricle ratio is 0.94 without significant right heart strain. Images of the lung parenchyma shows no acute infiltrate or pulmonary edema. Mild atherosclerotic calcifications of thoracic aorta and coronary arteries. Sagittal images of the spine shows degenerative changes and osteopenia thoracic spine. Review of the MIP images confirms the above findings. IMPRESSION: 1. Pulmonary emboli as described above in right upper lobe and right lower lobe. No significant right heart strain. 2. No acute infiltrate or pulmonary edema. 3. Osteopenia and degenerative changes thoracic spine. 4. Mild perihilar peribronchial thickening. These results were called by telephone at the time of interpretation on 12/02/2014 at 10:56 am to Dr. Tommi Rumps ,  who verbally acknowledged these results. Electronically Signed   By: Lahoma Crocker M.D.   On: 12/02/2014 11:02   US Venous Img Lower Unilateral Right  12/01/2014  CLINICAL DATA:  81 year old female with a history of right lower extremity pain EXAM: RIGHT LOWER EXTREMITY VENOUS DOPPLER ULTRASOUND TECHNIQUE: Gray-scale sonography with graded compression, as well as color Doppler and duplex ultrasound were performed to evaluate the lower extremity deep venous systems from the level of the common femoral vein and including the common femoral, femoral, profunda femoral, popliteal and calf veins including the posterior tibial, peroneal and gastrocnemius veins when visible. The superficial great saphenous vein was also interrogated. Spectral Doppler was utilized to evaluate flow at rest and with distal augmentation maneuvers in the common femoral, femoral and popliteal  veins. COMPARISON:  02/05/2013 FINDINGS: Contralateral Common Femoral Vein: Respiratory phasicity is normal and symmetric with the symptomatic side. Marginal thrombus/ synechiae along the wall of the left common femoral vein. Vein is compressible. Flow signal maintained. Common Femoral Vein: No evidence of thrombus. Normal compressibility, respiratory phasicity and response to augmentation. Saphenofemoral Junction: No evidence of thrombus. Normal compressibility and flow on color Doppler imaging. Profunda Femoral Vein: No evidence of thrombus. Normal compressibility and flow on color Doppler imaging. Femoral Vein: No evidence of thrombus. Normal compressibility, respiratory phasicity and response to augmentation. Popliteal Vein: No evidence of thrombus. Normal compressibility, respiratory phasicity and response to augmentation. Calf Veins: No evidence of thrombus. Normal compressibility and flow on color Doppler imaging. Superficial Great Saphenous Vein: No evidence of thrombus. Normal compressibility and flow on color Doppler imaging. Other Findings:  None. IMPRESSION: Sonographic survey of the right lower extremity negative for DVT. There is marginal thrombus/scar of the left common femoral vein which is nonocclusive, potentially representing chronic thrombus of indeterminate age, and favored not to be acute. Signed, Dulcy Fanny. Earleen Newport, DO [facility] Electronically Signed   By: Corrie Mckusick D.O.   On: 12/01/2014 18:18   Dg Chest Port 1 View  12/04/2014  CLINICAL DATA:  Feeling bad, nausea. Known lung clots, nonsmoker. History of the idiopathic pulmonary fibrosis. EXAM: PORTABLE CHEST 1 VIEW COMPARISON:  Chest radiograph February 06, 2013 FINDINGS: The cardiac silhouette remains mildly enlarged. Calcified aortic knob. Pulmonary vascular congestion, patchy bibasilar a and LEFT midlung zone airspace opacity. No pleural effusions. No pneumothorax. Soft  tissue planes and included osseous structure nonsuspicious. Surgical clips in the included right abdomen compatible with cholecystectomy. IMPRESSION: Mild cardiomegaly and pulmonary vascular congestion. Patchy LEFT midlung zone and bibasilar airspace opacities may represent confluent edema and/or pneumonia. Followup PA and lateral chest X-ray is recommended in 3-4 weeks following trial of antibiotic therapy to ensure resolution and exclude underlying malignancy. Electronically Signed   By: Elon Alas M.D.   On: 12/04/2014 06:28    EKG: Normal sinus rhythm. Normal EKG  Weights: Filed Weights   12/04/14 0542  Weight: 135 lb (61.236 kg)     Physical Exam: Blood pressure 104/41, pulse 73, temperature 97.8 F (36.6 C), temperature source Oral, resp. rate 16, height 5\' 3"  (1.6 m), weight 135 lb (61.236 kg), SpO2 98 %. Body mass index is 23.92 kg/(m^2). General: Well developed, well nourished, in no acute distress. Head eyes ears nose throat: Normocephalic, atraumatic, sclera non-icteric, no xanthomas, nares are without discharge. No apparent thyromegaly and/or mass  Lungs: Normal respiratory effort.  no wheezes, no rales, no rhonchi.  Heart: RRR with normal S1 S2. 2+ mitral murmur gallop, no rub,  PMI is normal size and placement, carotid upstroke normal without bruit, jugular venous pressure is normal Abdomen: Soft, non-tender, non-distended with normoactive bowel sounds. No hepatomegaly. No rebound/guarding. No obvious abdominal masses. Abdominal aorta is normal size without bruit Extremities: No edema. no cyanosis, no clubbing, no ulcers  Peripheral : 2+ bilateral upper extremity pulses, 2+ bilateral femoral pulses, 2+ bilateral dorsal pedal pulse Neuro: Alert and oriented. No facial asymmetry. No focal deficit. Moves all extremities spontaneously. Musculoskeletal: Normal muscle tone without kyphosis Psych:  Responds to questions appropriately with a normal affect.     Assessment: 81 year old female with mitral valve disease mixed hyperlipidemia pulmonary hypertension with a recent pulmonary embolism causing hypoxia now with nausea fatigue without current evidence of myocardial infarction but possible diastolic dysfunction congestive heart failure  Plan: 1. Admit to telemetry with telemetry unit nursing following for any possibility of rhythm disturbances including atrial fibrillation or tachycardia causing symptoms above 2. Continue anticoagulation for further risk reduction of pulmonary embolism and manifestations of shortness of breath 3. Further consideration of repeat stress test if patient has symptoms suggestive of cardiovascular disease and/or rhythm disturbances if the patient was able to walk on the treadmill 4. In ambulation and follow for improvements of symptoms  Signed, [name] M.D. [facility] 12/04/2014, 1:03 PM

## 2014-12-04 NOTE — ED Notes (Signed)
Pt. Stated feeling nauseated last night with no vomiting.  Pt. States "I felt like my heart was racing"  Pt. Reported feeling tachycardiac about 1 hour before coming to the ED tonight.  Pt. States it resolved itself in a few minutes.  Pt. Was discharged from hospital yesterday.  Pt. States being started on eliquis.

## 2014-12-05 DIAGNOSIS — I480 Paroxysmal atrial fibrillation: Secondary | ICD-10-CM | POA: Diagnosis not present

## 2014-12-05 DIAGNOSIS — I2699 Other pulmonary embolism without acute cor pulmonale: Secondary | ICD-10-CM | POA: Diagnosis not present

## 2014-12-05 DIAGNOSIS — I340 Nonrheumatic mitral (valve) insufficiency: Secondary | ICD-10-CM | POA: Diagnosis not present

## 2014-12-05 DIAGNOSIS — K219 Gastro-esophageal reflux disease without esophagitis: Secondary | ICD-10-CM | POA: Diagnosis not present

## 2014-12-05 DIAGNOSIS — I5031 Acute diastolic (congestive) heart failure: Secondary | ICD-10-CM | POA: Diagnosis not present

## 2014-12-05 DIAGNOSIS — R110 Nausea: Secondary | ICD-10-CM | POA: Diagnosis not present

## 2014-12-05 DIAGNOSIS — F419 Anxiety disorder, unspecified: Secondary | ICD-10-CM | POA: Diagnosis not present

## 2014-12-05 DIAGNOSIS — Z86711 Personal history of pulmonary embolism: Secondary | ICD-10-CM | POA: Diagnosis not present

## 2014-12-05 DIAGNOSIS — Z8709 Personal history of other diseases of the respiratory system: Secondary | ICD-10-CM | POA: Diagnosis not present

## 2014-12-05 DIAGNOSIS — R002 Palpitations: Secondary | ICD-10-CM | POA: Diagnosis not present

## 2014-12-05 LAB — BASIC METABOLIC PANEL
ANION GAP: 5 (ref 5–15)
BUN: 16 mg/dL (ref 6–20)
CALCIUM: 8.9 mg/dL (ref 8.9–10.3)
CO2: 28 mmol/L (ref 22–32)
Chloride: 108 mmol/L (ref 101–111)
Creatinine, Ser: 0.88 mg/dL (ref 0.44–1.00)
GFR, EST NON AFRICAN AMERICAN: 60 mL/min — AB (ref >60)
Glucose, Bld: 102 mg/dL — ABNORMAL HIGH (ref 65–99)
Potassium: 4 mmol/L (ref 3.5–5.1)
SODIUM: 141 mmol/L (ref 135–145)

## 2014-12-05 LAB — CBC
HCT: 38.4 % (ref 35.0–47.0)
HEMOGLOBIN: 12.8 g/dL (ref 12.0–16.0)
MCH: 29.8 pg (ref 26.0–34.0)
MCHC: 33.2 g/dL (ref 32.0–36.0)
MCV: 89.7 fL (ref 80.0–100.0)
Platelets: 251 10*3/uL (ref 150–440)
RBC: 4.28 MIL/uL (ref 3.80–5.20)
RDW: 14.7 % — ABNORMAL HIGH (ref 11.5–14.5)
WBC: 5.9 10*3/uL (ref 3.6–11.0)

## 2014-12-05 LAB — T3, FREE: T3 FREE: 3 pg/mL (ref 2.0–4.4)

## 2014-12-05 MED ORDER — ONDANSETRON 4 MG PO TBDP
4.0000 mg | ORAL_TABLET | Freq: Three times a day (TID) | ORAL | Status: DC | PRN
Start: 2014-12-05 — End: 2014-12-11

## 2014-12-05 NOTE — Progress Notes (Signed)
Pt VSS, No complaints of pain nor nausea. Tolerating diet. Pt telemetry SR. Pt ambulated around nursing station twice with no complications. Pt received discharge orders. Instructions were reviewed with pt. IV removed with dressing dry and intact. Pt was escorted out via wheelchair.

## 2014-12-05 NOTE — Discharge Summary (Addendum)
[facility] [place] NAME: Frances Kent    MR#:  [id number]  DATE OF BIRTH:  12/17/1930  DATE OF ADMISSION:  12/04/2014 ADMITTING PHYSICIAN: Vaughan Basta, MD  DATE OF DISCHARGE: 12/05/2014  PRIMARY CARE PHYSICIAN: [name], MD    ADMISSION DIAGNOSIS:  Hospital-acquired pneumonia [J18.9]  Pneumonia ruled out as she did not had any symptoms.  DISCHARGE DIAGNOSIS:  Principal Problem:   Palpitation Active Problems:   Nausea    Ac diastolic Heart failure.  SECONDARY DIAGNOSIS:   Past Medical History  Diagnosis Date  . GERD (gastroesophageal reflux disease)   . IPF (idiopathic pulmonary fibrosis) ([place]) 20111    by CT, PFTS done by Humphrey Rolls  . Pulmonary embolism ([place])   . Atrial fibrillation ([place])   . Mitral regurgitation     HOSPITAL COURSE:   * Palpitation Patient has paroxysmal atrial fibrillation, I strongly feel this might be an episode of cardiac arrhythmia. Currently she is in sinus rhythm and without any discomfort. As it happened twice last night , admit her on observation on telemetry, her echocardiogram is done yesterday. Appreciated help of Dr. [name] for further management. She is already on Eliquis due to her pulmonary embolism.  Dr. [name] suggested to do stress test on Monday afternoon.  * Acute diastolic congestive heart failure Patient has some evidence of pulmonary edema on chest x-ray and she had crepitations.  one dose of IV Lasix, then monitor symptomatically.   Remained stable.  * Recent pulmonary embolism Patient is not short of breath, hypotensive. She is already started on eliquis yesterday, continue for now.  * Anxiety Continue Xanax.  * Depression esophageal reflux disease Continue pantoprazole.  DISCHARGE CONDITIONS:   Stable.  CONSULTS OBTAINED:  Treatment Team:  [name], MD  DRUG ALLERGIES:  No Known Allergies  DISCHARGE MEDICATIONS:    Current Discharge Medication List    START taking these medications   Details  ondansetron (ZOFRAN ODT) 4 MG disintegrating tablet Take 1 tablet (4 mg total) by mouth every 8 (eight) hours as needed for nausea or vomiting. Qty: 20 tablet, Refills: 0      CONTINUE these medications which have NOT CHANGED   Details  acidophilus (RISAQUAD) CAPS capsule Take 1 capsule by mouth daily.    ALPRAZolam (XANAX) 0.25 MG tablet Take 1 tablet (0.25 mg total) by mouth 2 (two) times daily as needed for anxiety. Qty: 60 tablet, Refills: 0    !! apixaban (ELIQUIS) 5 MG TABS tablet Take 2 tablets (10 mg total) by mouth 2 (two) times daily. Qty: 13 tablet, Refills: 0    !! apixaban (ELIQUIS) 5 MG TABS tablet Take 1 tablet (5 mg total) by mouth 2 (two) times daily. Qty: 60 tablet, Refills: 1    pantoprazole (PROTONIX) 40 MG tablet Take 40 mg by mouth daily.     !! - Potential duplicate medications found. Please discuss with provider.       DISCHARGE INSTRUCTIONS:    [id number] with Dr. [name] in office.  If you experience worsening of your admission symptoms, develop shortness of breath, life threatening emergency, suicidal or homicidal thoughts you must seek medical attention immediately by calling 911 or calling your MD immediately  if symptoms less severe.  You Must read complete instructions/literature along with all the possible adverse reactions/side effects for all the Medicines you take and that have been prescribed to you. Take any new Medicines after you have completely understood and  accept all the possible adverse reactions/side effects.   Please note  You were cared for by a hospitalist during your hospital stay. If you have any questions about your discharge medications or the care you received while you were in the hospital after you are discharged, you can call the unit and asked to speak with the hospitalist on call if the hospitalist that took care of you is not available.  Once you are discharged, your primary care physician will handle any further medical issues. Please note that NO REFILLS for any discharge medications will be authorized once you are discharged, as it is imperative that you return to your primary care physician (or establish a relationship with a primary care physician if you do not have one) for your aftercare needs so that they can reassess your need for medications and monitor your lab values.    Today   CHIEF COMPLAINT:   Chief Complaint  Patient presents with  . Fatigue  . Nausea  . Tachycardia    HISTORY OF PRESENT ILLNESS:  Frances Kent  is a 81 y.o. female with a known history of mitral regurgitation, paroxysmal atrial fibrillation, recently diagnosed pulmonary embolism and was discharged from hospital yesterday on atelectasis, gastroesophageal reflux disease, anxiety. She was feeling completely fine and pleasant, back to her baseline yesterday when I discharged her. She said last night she started feeling nausea and palpitation in her chest and came to emergency room, stayed for 3 hours and emergency room but is everything was under control and within normal limits she was discharged back home. Today morning she again started having same symptoms with brief episode of palpitation and feeling like she will die so her daughter brought her to emergency room again. In ER on workup she was noted to have new onset pulmonary edema but her heart rate in vitals are under control, hospitalist service was contacted for further management of this case. Further questioning she denies any fever or cough, chills or shortness of breath.  VITAL SIGNS:  Blood pressure 104/52, pulse 60, temperature 98.3 F (36.8 C), temperature source Oral, resp. rate 22, height 5\' 3"  (1.6 m), weight 61.236 kg (135 lb), SpO2 95 %.  I/O:    Intake/Output Summary (Last 24 hours) at 12/05/14 1137 Last data filed at 12/05/14 0744  Gross per 24 hour  Intake      0 ml   Output      0 ml  Net      0 ml    PHYSICAL EXAMINATION:   GENERAL: 81 y.o.-year-old patient lying in the bed with no acute distress.  EYES: Pupils equal, round, reactive to light and accommodation. No scleral icterus. Extraocular muscles intact.  HEENT: Head atraumatic, normocephalic. Oropharynx and nasopharynx clear.  NECK: Supple, no jugular venous distention. No thyroid enlargement, no tenderness.  LUNGS: Normal breath sounds bilaterally, no wheezing, Mild crepitation on both lung bases. No use of accessory muscles of respiration.  CARDIOVASCULAR: S1, S2 normal. No murmurs, rubs, or gallops.  ABDOMEN: Soft, nontender, nondistended. Bowel sounds present. No organomegaly or mass.  EXTREMITIES: No pedal edema, cyanosis, or clubbing.  NEUROLOGIC: Cranial nerves II through XII are intact. Muscle strength 5/5 in all extremities. Sensation intact. Gait not checked.  PSYCHIATRIC: The patient is alert and oriented x 3.  SKIN: No obvious rash, lesion, or ulcer.   DATA REVIEW:   CBC  Recent Labs Lab 12/05/14 0424  WBC 5.9  HGB 12.8  HCT 38.4  PLT 251  Chemistries   Recent Labs Lab 12/03/14 2124 12/04/14 0555 12/05/14 0424  NA 141 142 141  K 3.8 3.7 4.0  CL 109 108 108  CO2 26 28 28   GLUCOSE 109* 93 102*  BUN 18 16 16   CREATININE 0.84 0.86 0.88  CALCIUM 9.4 9.4 8.9  MG  --  2.2  --   AST 14*  --   --   ALT 15  --   --   ALKPHOS 85  --   --   BILITOT 0.5  --   --     Cardiac Enzymes  Recent Labs Lab 12/04/14 0555  TROPONINI <0.03    Microbiology Results  Results for orders placed or performed in visit on 02/05/13  Urine culture     Status: None   Collection Time: 02/05/13  2:08 PM  Result Value Ref Range Status   Colony Count NO GROWTH  Final   Organism ID, Bacteria NO GROWTH  Final    RADIOLOGY:  Dg Chest Port 1 View  12/04/2014  CLINICAL DATA:  Feeling bad, nausea. Known lung clots, nonsmoker. History of the idiopathic pulmonary  fibrosis. EXAM: PORTABLE CHEST 1 VIEW COMPARISON:  Chest radiograph February 06, 2013 FINDINGS: The cardiac silhouette remains mildly enlarged. Calcified aortic knob. Pulmonary vascular congestion, patchy bibasilar a and LEFT midlung zone airspace opacity. No pleural effusions. No pneumothorax. Soft tissue planes and included osseous structure nonsuspicious. Surgical clips in the included right abdomen compatible with cholecystectomy. IMPRESSION: Mild cardiomegaly and pulmonary vascular congestion. Patchy LEFT midlung zone and bibasilar airspace opacities may represent confluent edema and/or pneumonia. Followup PA and lateral chest X-ray is recommended in 3-4 weeks following trial of antibiotic therapy to ensure resolution and exclude underlying malignancy. Electronically Signed   By: Elon Alas M.D.   On: 12/04/2014 06:28       Management plans discussed with the patient, family and they are in agreement.  CODE STATUS:     Code Status Orders        Start     Ordered   12/04/14 1216  Full code   Continuous     12/04/14 1215    Advance Directive Documentation        Most Recent Value   Type of Advance Directive  Healthcare Power of Attorney, Living will   Pre-existing out of facility DNR order (yellow form or pink MOST form)     "MOST" Form in Place?        TOTAL TIME TAKING CARE OF THIS PATIENT: 35 minutes.  Discussed with her 2 daughters in room.  Vaughan Basta M.D on 12/05/2014 at 11:37 AM  Between 7am to 6pm - Pager - [phone]  After 6pm go to [email or web address] - password [email or web address] [facility]  Office  [phone]  CC: Primary care physician; [name], MD   Note: This dictation was prepared with Dragon dictation along with smaller phrase technology. Any transcriptional errors that result from this process are unintentional.

## 2014-12-05 NOTE — Care Management (Signed)
Patient admitted under observation for palpitations.  She had a recent discharge 10/18 for pulmonary embolism on new eliquis.  Questioned attending about need for home health nursing and informed this service is not indicated.

## 2014-12-05 NOTE — Discharge Instructions (Signed)
Follow with Dr. [name] in 1 week.

## 2014-12-08 ENCOUNTER — Telehealth: Payer: Self-pay

## 2014-12-08 LAB — PROTHROMBIN GENE MUTATION

## 2014-12-08 LAB — BETA-2-GLYCOPROTEIN I ABS, IGG/M/A: Beta-2-Glycoprotein I IgA: <9 GPI IgA units (ref 0–25)

## 2014-12-08 LAB — FACTOR 5 LEIDEN

## 2014-12-08 LAB — HOMOCYSTEINE: Homocysteine: 11.9 umol/L (ref 0.0–15.0)

## 2014-12-08 NOTE — Telephone Encounter (Signed)
Transition Care Management Follow-up Telephone Call   Date discharged?  12/05/14  How have you been since you were released from the hospital? Feeling pretty good. Chest does not hurt. Right leg no longer hurts. I have been trying not to over due it and I am just piddling around the house.     Do you understand why you were in the hospital? Yes, I was feeling bad.    Do you understand the discharge instructions? Yes I do.   Where were you discharged to? Home.   Items Reviewed:  Medications reviewed: Yes  Allergies reviewed: Yes  Dietary changes reviewed: Yes  Referrals reviewed: Yes   Functional Questionnaire:  Activities of Daily Living (ADLs):   She states they are independent in the following: Independent in all ADLs. States they require assistance with the following: Does not require assistance at this time.  Any transportation issues/concerns?: No   Any patient concerns? The back of her neck started bothering her on Saturday.  She isn't sure if it is tension related, but it is sore when turning head from left to right.     Confirmed importance and date/time of follow-up visits scheduled Yes, appointment made for Wednesday on 12/10/14 at 4:30.  Provider Appointment booked with Dr. Derrel Nip (PCP).  Confirmed with patient if condition begins to worsen call PCP or go to the ER.  Patient was given the office number and encouraged to call back with question or concerns.  : Yes, patient verbalized understanding.

## 2014-12-10 ENCOUNTER — Ambulatory Visit: Payer: Medicare Other | Admitting: Internal Medicine

## 2014-12-10 DIAGNOSIS — I272 Other secondary pulmonary hypertension: Secondary | ICD-10-CM | POA: Diagnosis not present

## 2014-12-10 DIAGNOSIS — I471 Supraventricular tachycardia: Secondary | ICD-10-CM | POA: Diagnosis not present

## 2014-12-10 DIAGNOSIS — I2699 Other pulmonary embolism without acute cor pulmonale: Secondary | ICD-10-CM | POA: Diagnosis not present

## 2014-12-10 DIAGNOSIS — I209 Angina pectoris, unspecified: Secondary | ICD-10-CM | POA: Diagnosis not present

## 2014-12-10 DIAGNOSIS — I071 Rheumatic tricuspid insufficiency: Secondary | ICD-10-CM | POA: Diagnosis not present

## 2014-12-11 ENCOUNTER — Ambulatory Visit (INDEPENDENT_AMBULATORY_CARE_PROVIDER_SITE_OTHER): Payer: Commercial Managed Care - HMO | Admitting: Internal Medicine

## 2014-12-11 ENCOUNTER — Ambulatory Visit
Admission: RE | Admit: 2014-12-11 | Discharge: 2014-12-11 | Disposition: A | Discharge status: Home / Self Care | Payer: Commercial Managed Care - HMO | Source: Ambulatory Visit | Attending: Internal Medicine | Admitting: Internal Medicine

## 2014-12-11 ENCOUNTER — Encounter: Payer: Self-pay | Admitting: Internal Medicine

## 2014-12-11 VITALS — BP 142/60 | HR 67 | Temp 98.1°F | Resp 12 | Ht 63.0 in | Wt 133.8 lb

## 2014-12-11 DIAGNOSIS — M47814 Spondylosis without myelopathy or radiculopathy, thoracic region: Secondary | ICD-10-CM | POA: Diagnosis not present

## 2014-12-11 DIAGNOSIS — M542 Cervicalgia: Secondary | ICD-10-CM

## 2014-12-11 DIAGNOSIS — M50322 Other cervical disc degeneration at C5-C6 level: Secondary | ICD-10-CM | POA: Insufficient documentation

## 2014-12-11 DIAGNOSIS — I2699 Other pulmonary embolism without acute cor pulmonale: Secondary | ICD-10-CM | POA: Diagnosis not present

## 2014-12-11 DIAGNOSIS — Z23 Encounter for immunization: Secondary | ICD-10-CM

## 2014-12-11 DIAGNOSIS — F411 Generalized anxiety disorder: Secondary | ICD-10-CM

## 2014-12-11 MED ORDER — DIAZEPAM 5 MG PO TABS: 5.0000 mg | ORAL_TABLET | Freq: Two times a day (BID) | ORAL | Status: DC | PRN

## 2014-12-11 NOTE — Assessment & Plan Note (Addendum)
Multiple ,  Believed to have emigrated from a chronc thrombus seen on ultrasound of left femoral vein.  Etiology unclear.  She has been more sedentary for the past several weeks.  She is up to date on CA screening.  Hypercoag workup was done during first hospitalization nd her DRVVT was prolonged and did improve but not completely correct with the mixing study.  She is now taking  Eliqiuis, unclear how long it will need to be continued,  Will depend on the interpretation of the DRVVT

## 2014-12-11 NOTE — Patient Instructions (Addendum)
Your neck pain appears to be coming from muscle spasm and arthritis.  You can take 500 mg tylenol every 6 hours for pain  I have prescribed generic valium to use up to twice daily as a muscle relaxer.  STOP USING THE ALPRAZOLAM WHILE YOU ARE USING THE VALIUM.  THEY ARE TOO SIMILAR..  I have ordered palin x rays of your cervical spine to evaluate the alignment  And the arthritis .  You do not need an appointment to get them done at the hospital    You received the new Pneumonia vaccine today called Prevnar

## 2014-12-11 NOTE — Progress Notes (Signed)
Subjective:  Patient ID: Frances Kent, female    DOB: 07-31-1930  Age: 81 y.o. MRN: [id number]  CC: The primary encounter diagnosis was Bilateral posterior neck pain. Diagnoses of Other acute pulmonary embolism ([place]), Need for prophylactic vaccination against Streptococcus pneumoniae (pneumococcus), Generalized anxiety disorder, and Neck pain, bilateral were also pertinent to this visit.  HPI Frances Kent presents for hospital follow up, was readmitted to [facility] on Oct 20th with  Pulmonary October edema and concern for pneumonia and discharged on Oct 93ZJ with "diastolic heart failure".   Hosptial events and records reviewed with patient today.   History : patient was admitted initially to [facility] on Oct 18th with multiple right sided pulmonary  emboli and discharged  home on Eliqjuis  One day later, on Oct 19th.  The PE was considered to have mobilized from Left common femoral vein ,  since outpatient DVT showed a chronic thrombus.  She returned to ER several times over the following 48 hours with nausea and severe malaise  and   and sent home .  The  Following day had another episode and her  daughter brought her to ER , new onset pulmonary edema noted  And patient admitted, ECHO was not repeated .  stress test  Was advised per [name]. But  not done, Lasix given IV x 1 but not adt discharge.  Eliquis dose has been reduced to 5 mg bid.     Saw Kowalski yesterday.  stress test is scheduled for next Wednesday  And he told her it would be safe to do the stress test bc the clots would probably  be gone .   ,  Thinks her episodes of malaise may be panic attacks,since she was not having chest pain or shortness of breath or nausea.  She has been taking prescribed alprazolam low dose bid  2) New onset neck pain .  The pain is brought on by turning head to the left and right,.  Posterior i nlocation ,  Started Saturday afternoon, came home from hospital on Friday, does not radiate.  Does not hurt as  much with forward flexion.  Has a therapeutic pillow.   Has not improved with time and has not taken anything with it  Outpatient Prescriptions Prior to Visit  Medication Sig Dispense Refill  . acidophilus (RISAQUAD) CAPS capsule Take 1 capsule by mouth daily.    Marland Kitchen ALPRAZolam (XANAX) 0.25 MG tablet Take 1 tablet (0.25 mg total) by mouth 2 (two) times daily as needed for anxiety. 60 tablet 0  . apixaban (ELIQUIS) 5 MG TABS tablet Take 1 tablet (5 mg total) by mouth 2 (two) times daily. (Patient taking differently: Take 5 mg by mouth 2 (two) times daily. ) 60 tablet 1  . pantoprazole (PROTONIX) 40 MG tablet Take 40 mg by mouth daily.    Marland Kitchen apixaban (ELIQUIS) 5 MG TABS tablet Take 2 tablets (10 mg total) by mouth 2 (two) times daily. 13 tablet 0  . ondansetron (ZOFRAN ODT) 4 MG disintegrating tablet Take 1 tablet (4 mg total) by mouth every 8 (eight) hours as needed for nausea or vomiting. (Patient not taking: Reported on 12/11/2014) 20 tablet 0   No facility-administered medications prior to visit.    Review of Systems;  Patient denies headache, fevers, malaise, unintentional weight loss, skin rash, eye pain, sinus congestion and sinus pain, sore throat, dysphagia,  hemoptysis , cough, dyspnea, wheezing, chest pain, palpitations, orthopnea, edema, abdominal pain, nausea, melena, diarrhea, constipation,  flank pain, dysuria, hematuria, urinary  Frequency, nocturia, numbness, tingling, seizures,  Focal weakness, Loss of consciousness,  Tremor, insomnia, depression, anxiety, and suicidal ideation.      Objective:  BP 142/60 mmHg  Pulse 67  Temp(Src) 98.1 F (36.7 C) (Oral)  Resp 12  Ht 5\' 3"  (1.6 m)  Wt 133 lb 12 oz (60.669 kg)  BMI 23.70 kg/m2  SpO2 98%  BP Readings from Last 3 Encounters:  12/11/14 142/60  12/05/14 104/52  12/03/14 151/66    Wt Readings from Last 3 Encounters:  12/11/14 133 lb 12 oz (60.669 kg)  12/04/14 135 lb (61.236 kg)  12/03/14 134 lb (60.782 kg)    General  appearance: alert, cooperative and appears stated age Ears: normal TM's and external ear canals both ears Throat: lips, mucosa, and tongue normal; teeth and gums normal Neck: no adenopathy, no carotid bruit, supple, symmetrical, trachea midline and thyroid not enlarged, symmetric, no tenderness/mass/nodules Back: symmetric, no curvature. ROM normal. No CVA tenderness. Lungs: clear to auscultation bilaterally Heart: regular rate and rhythm, S1, S2 normal, no murmur, click, rub or gallop Abdomen: soft, non-tender; bowel sounds normal; no masses,  no organomegaly Pulses: 2+ and symmetric Skin: Skin color, texture, turgor normal. No rashes or lesions Lymph nodes: Cervical, supraclavicular, and axillary nodes normal.  No results found for: HGBA1C  Lab Results  Component Value Date   CREATININE 0.88 12/05/2014   CREATININE 0.86 12/04/2014   CREATININE 0.84 12/03/2014    Lab Results  Component Value Date   WBC 5.9 12/05/2014   HGB 12.8 12/05/2014   HCT 38.4 12/05/2014   PLT 251 12/05/2014   GLUCOSE 102* 12/05/2014   CHOL 274* 04/02/2014   TRIG 114.0 04/02/2014   HDL 79.90 04/02/2014   LDLDIRECT 185.3 03/27/2013   LDLCALC 171* 04/02/2014   ALT 15 12/03/2014   AST 14* 12/03/2014   NA 141 12/05/2014   K 4.0 12/05/2014   CL 108 12/05/2014   CREATININE 0.88 12/05/2014   BUN 16 12/05/2014   CO2 28 12/05/2014   TSH 6.200* 12/04/2014   INR 0.94 12/02/2014    Dg Chest Port 1 View  12/04/2014  CLINICAL DATA:  Feeling bad, nausea. Known lung clots, nonsmoker. History of the idiopathic pulmonary fibrosis. EXAM: PORTABLE CHEST 1 VIEW COMPARISON:  Chest radiograph February 06, 2013 FINDINGS: The cardiac silhouette remains mildly enlarged. Calcified aortic knob. Pulmonary vascular congestion, patchy bibasilar a and LEFT midlung zone airspace opacity. No pleural effusions. No pneumothorax. Soft tissue planes and included osseous structure nonsuspicious. Surgical clips in the included right  abdomen compatible with cholecystectomy. IMPRESSION: Mild cardiomegaly and pulmonary vascular congestion. Patchy LEFT midlung zone and bibasilar airspace opacities may represent confluent edema and/or pneumonia. Followup PA and lateral chest X-ray is recommended in 3-4 weeks following trial of antibiotic therapy to ensure resolution and exclude underlying malignancy. Electronically Signed   By: Elon Alas M.D.   On: 12/04/2014 06:28    Assessment & Plan:   Problem List Items Addressed This Visit    Generalized anxiety disorder    She has been having recurrent episodes that sound like panic attacks.  changing alprazolam  to valium, return in one month       Acute pulmonary embolism (HCC)    Multiple ,  Believed to have emigrated from a chronc thrombus seen on ultrasound of left femoral vein.  Etiology unclear.  She has been more sedentary for the past several weeks.  She is up to date on CA  screening.  Hypercoag workup was done during first hospitalization nd her DRVVT was prolonged and did improve but not completely correct with the mixing study.  She is now taking  Eliqiuis, unclear how long it will need to be continued,  Will depend on the interpretation of the DRVVT       Neck pain, bilateral    Suspect DDD with muscle spasm.  Palin fillms ,  valium bid.        Other Visit Diagnoses    Bilateral posterior neck pain    -  Primary    Relevant Orders    DG Cervical Spine Complete (Completed)    Need for prophylactic vaccination against Streptococcus pneumoniae (pneumococcus)        Relevant Orders    Pneumococcal conjugate vaccine 13-valent (Completed)       I have discontinued Ms. Bolotin's ondansetron. I am also having her start on diazepam. Additionally, I am having her maintain her ALPRAZolam, pantoprazole, acidophilus, and apixaban.  Meds ordered this encounter  Medications  . diazepam (VALIUM) 5 MG tablet    Sig: Take 1 tablet (5 mg total) by mouth every 12 (twelve)  hours as needed for anxiety or muscle spasms.    Dispense:  60 tablet    Refill:  1    Medications Discontinued During This Encounter  Medication Reason  . apixaban (ELIQUIS) 5 MG TABS tablet Duplicate  . ondansetron (ZOFRAN ODT) 4 MG disintegrating tablet     Follow-up: Return in about 4 weeks (around 01/08/2015).   [name], MD

## 2014-12-12 ENCOUNTER — Other Ambulatory Visit: Payer: Self-pay | Admitting: Internal Medicine

## 2014-12-12 MED ORDER — PREDNISONE 10 MG PO TABS: ORAL_TABLET | ORAL | Status: DC

## 2014-12-13 ENCOUNTER — Other Ambulatory Visit: Payer: Self-pay | Admitting: Internal Medicine

## 2014-12-13 DIAGNOSIS — M47812 Spondylosis without myelopathy or radiculopathy, cervical region: Secondary | ICD-10-CM | POA: Insufficient documentation

## 2014-12-13 NOTE — Assessment & Plan Note (Signed)
She has been having recurrent episodes that sound like panic attacks.  changing alprazolam  to valium, return in one month

## 2014-12-13 NOTE — Assessment & Plan Note (Signed)
Suspect DDD with muscle spasm.  Palin fillms ,  valium bid.

## 2014-12-17 DIAGNOSIS — I071 Rheumatic tricuspid insufficiency: Secondary | ICD-10-CM | POA: Diagnosis not present

## 2014-12-17 DIAGNOSIS — I209 Angina pectoris, unspecified: Secondary | ICD-10-CM | POA: Diagnosis not present

## 2014-12-17 DIAGNOSIS — I471 Supraventricular tachycardia: Secondary | ICD-10-CM | POA: Diagnosis not present

## 2014-12-22 DIAGNOSIS — H353221 Exudative age-related macular degeneration, left eye, with active choroidal neovascularization: Secondary | ICD-10-CM | POA: Diagnosis not present

## 2014-12-24 DIAGNOSIS — H353221 Exudative age-related macular degeneration, left eye, with active choroidal neovascularization: Secondary | ICD-10-CM | POA: Diagnosis not present

## 2014-12-30 ENCOUNTER — Telehealth: Payer: Self-pay | Admitting: Internal Medicine

## 2014-12-30 NOTE — Telephone Encounter (Signed)
11:30 on this thursday

## 2014-12-30 NOTE — Telephone Encounter (Signed)
Please advise 

## 2014-12-30 NOTE — Telephone Encounter (Signed)
Pt called about her neck still hurting her. Pt wants to come back in. Pt does not want to see another provider. No appt avail until 11/23 @2pm . Let me know if we can work her in sooner. Thank You!

## 2014-12-30 NOTE — Telephone Encounter (Signed)
Spoke with patient and scheduled for 1130 on Thursday.

## 2015-01-01 ENCOUNTER — Ambulatory Visit (INDEPENDENT_AMBULATORY_CARE_PROVIDER_SITE_OTHER): Payer: Commercial Managed Care - HMO | Admitting: Internal Medicine

## 2015-01-01 ENCOUNTER — Encounter: Payer: Self-pay | Admitting: Internal Medicine

## 2015-01-01 VITALS — BP 122/58 | HR 80 | Temp 98.2°F | Wt 135.0 lb

## 2015-01-01 DIAGNOSIS — M47812 Spondylosis without myelopathy or radiculopathy, cervical region: Secondary | ICD-10-CM | POA: Diagnosis not present

## 2015-01-01 DIAGNOSIS — H3530 Unspecified macular degeneration: Secondary | ICD-10-CM

## 2015-01-01 DIAGNOSIS — I2699 Other pulmonary embolism without acute cor pulmonale: Secondary | ICD-10-CM | POA: Diagnosis not present

## 2015-01-01 MED ORDER — APIXABAN 5 MG PO TABS: 5.0000 mg | ORAL_TABLET | Freq: Two times a day (BID) | ORAL | Status: DC

## 2015-01-01 NOTE — Patient Instructions (Addendum)
Your neck pain is coming from degenerative arthritis in your cervical spine,  NOT  From your blood clot.  You have a bulging disk and bone spurs that are causing your muscles to spasm   This may get worse over time and require surgery to decompress your spinal cord or your nerve roots  To prevent pain and numbness in your left arm.   Make sure your pillow has good neck support.    For your pain:   You can take Tylenol (acetaminophen) up to 2000 mg daily  (that's 4 of the 500 mg tablets r 3 of the 650 mg tablets)  Avoid using aleve or Motrin more  Than 2 or 3 times per week  Use ice packs or heating pads to relieve pain and stiffness  Use the valium for muscle spasm .

## 2015-01-01 NOTE — Assessment & Plan Note (Signed)
Plain  Films indicate disk herniation at C5-C6 level with left sided foraminal narrowing.  She states that the prednisone taper did not really improve her pain.  The  Pain is rated as a  4/10 and does not radiate down either arm. Tylenol and judicious use of motrin (not more than 3/week)

## 2015-01-01 NOTE — Assessment & Plan Note (Addendum)
Resuming Avastin weekly injections by Dr Oval Linsey,  Referral made

## 2015-01-01 NOTE — Assessment & Plan Note (Addendum)
Multiple ,  Believed to have emigrated from a chronc thrombus seen on ultrasound of left femoral vein.  No precipitant surgery or immobilization.She is up to date on CA screening.  Hypercoag workup was done during first hospitalization and her DRVVT was prolonged and did improve but not completely correct with the mixing study.  She is now taking  Eliqiuis, unclear how long it will need to be continued,  Will depend on the interpretation of the DRVVT

## 2015-01-01 NOTE — Progress Notes (Signed)
Subjective:  Patient ID: Frances Kent, female    DOB: 14-Jan-1931  Age: 81 y.o. MRN: [id number]  CC: The primary encounter diagnosis was Macular degeneration, left eye. Diagnoses of Other acute pulmonary embolism without acute cor pulmonale (HCC) and Cervical spine degeneration were also pertinent to this visit.  HPI Frances Kent presents for follow up on persistent posterior neck pain,  Started in mid October.  Plain  Films indicated disk herniation at C5-C6 level with left sided foraminal narrowing.  She states that the prednisone taper did not really improve her pain.  The  Pain is rated as a  4/10 and does not radiate down either arm.  It is aggravated by turning her head to either side and by sleeping on her right side.  Her primary concern is in making sure it was not caused by recent diagnosis of pulmonary emboli  2) Low blood pressure;  Since discharge she has been checking her BP at home several times per week. Blood pressure has been low on occasions at home, with systolics in the [id number] but twice recently in the high 80's.  She was not feeling well at the time.  She takes no medication for hypertension and no narcotics/.  Diet reviewed.  She avoids salt and does not drink any free water,  Just caffeinated  coffee and tea.   3) Recent cardiology follow up for dyspnea .  Normal Lexiscan (Kowalksi).  Mitral and tricuspid regurgitation. She no longer feels dyspneic and climbs a flight of starirs in her hom e"about 10 times per day." no regular exercise.    4) Macular degeneration  She has resumed Avastin for left eye macular degeneration ,  Started back on medication last week  monthly injections advised by  Dr Oval Linsey,  [facility] requiring referral.  Outpatient Prescriptions Prior to Visit  Medication Sig Dispense Refill  . acidophilus (RISAQUAD) CAPS capsule Take 1 capsule by mouth daily.    . diazepam (VALIUM) 5 MG tablet Take 1 tablet (5 mg total) by mouth every 12 (twelve) hours as  needed for anxiety or muscle spasms. 60 tablet 1  . pantoprazole (PROTONIX) 40 MG tablet Take 40 mg by mouth daily.    Marland Kitchen apixaban (ELIQUIS) 5 MG TABS tablet Take 1 tablet (5 mg total) by mouth 2 (two) times daily. (Patient taking differently: Take 5 mg by mouth 2 (two) times daily. ) 60 tablet 1  . ALPRAZolam (XANAX) 0.25 MG tablet Take 1 tablet (0.25 mg total) by mouth 2 (two) times daily as needed for anxiety. (Patient not taking: Reported on 01/01/2015) 60 tablet 0  . pantoprazole (PROTONIX) 40 MG tablet TAKE ONE TABLET BY MOUTH ONCE DAILY 30 tablet 11  . predniSONE (DELTASONE) 10 MG tablet 6 tablets on Day 1 , then reduce by 1 tablet daily until gone 21 tablet 0   No facility-administered medications prior to visit.    Review of Systems;  Patient denies headache, fevers, malaise, unintentional weight loss, skin rash, eye pain, sinus congestion and sinus pain, sore throat, dysphagia,  hemoptysis , cough, dyspnea, wheezing, chest pain, palpitations, orthopnea, edema, abdominal pain, nausea, melena, diarrhea, constipation, flank pain, dysuria, hematuria, urinary  Frequency, nocturia, numbness, tingling, seizures,  Focal weakness, Loss of consciousness,  Tremor, insomnia, depression, anxiety, and suicidal ideation.      Objective:  BP 122/58 mmHg  Pulse 80  Temp(Src) 98.2 F (36.8 C) (Oral)  Wt 135 lb (61.236 kg)  SpO2 98%  BP Readings from Last  3 Encounters:  01/01/15 122/58  12/11/14 142/60  12/05/14 104/52    Wt Readings from Last 3 Encounters:  01/01/15 135 lb (61.236 kg)  12/11/14 133 lb 12 oz (60.669 kg)  12/04/14 135 lb (61.236 kg)    General appearance: alert, cooperative and appears stated age Ears: normal TM's and external ear canals both ears Throat: lips, mucosa, and tongue normal; teeth and gums normal Neck: no adenopathy, no carotid bruit, supple, symmetrical, trachea midline and thyroid not enlarged, symmetric, no tenderness/mass/nodules Back: symmetric, no  curvature. ROM normal. No CVA tenderness. Lungs: clear to auscultation bilaterally Heart: regular rate and rhythm, S1, S2 normal, no murmur, click, rub or gallop Abdomen: soft, non-tender; bowel sounds normal; no masses,  no organomegaly Pulses: 2+ and symmetric Skin: Skin color, texture, turgor normal. No rashes or lesions Lymph nodes: Cervical, supraclavicular, and axillary nodes normal.  No results found for: HGBA1C  Lab Results  Component Value Date   CREATININE 0.88 12/05/2014   CREATININE 0.86 12/04/2014   CREATININE 0.84 12/03/2014    Lab Results  Component Value Date   WBC 5.9 12/05/2014   HGB 12.8 12/05/2014   HCT 38.4 12/05/2014   PLT 251 12/05/2014   GLUCOSE 102* 12/05/2014   CHOL 274* 04/02/2014   TRIG 114.0 04/02/2014   HDL 79.90 04/02/2014   LDLDIRECT 185.3 03/27/2013   LDLCALC 171* 04/02/2014   ALT 15 12/03/2014   AST 14* 12/03/2014   NA 141 12/05/2014   K 4.0 12/05/2014   CL 108 12/05/2014   CREATININE 0.88 12/05/2014   BUN 16 12/05/2014   CO2 28 12/05/2014   TSH 6.200* 12/04/2014   INR 0.94 12/02/2014    Dg Cervical Spine Complete  12/11/2014  CLINICAL DATA:  Posterior neck pain for 1 week.  No injury. EXAM: CERVICAL SPINE - COMPLETE 4+ VIEW COMPARISON:  None. FINDINGS: No fracture.  No spondylolisthesis. Mild loss of disc height at C5-C6. Remaining discs are well preserved in height. Uncovertebral spurring on the left at C5-C6 causes mild left neural foraminal narrowing. No other significant stenosis. Mild facet degenerative changes noted bilaterally at C5-C6. Bones are demineralized.  Soft tissues are unremarkable. IMPRESSION: 1. Disc and facet degenerative changes at C5-C6 with mild left neural foraminal narrowing. 2. No fracture.  No spondylolisthesis. Electronically Signed   By: Lajean Manes M.D.   On: 12/11/2014 14:10    Assessment & Plan:   Problem List Items Addressed This Visit    Macular degeneration, left eye - Primary    Resuming Avastin  weekly injections by Dr Oval Linsey,  Referral made       Relevant Orders   Ambulatory referral to Ophthalmology   Acute pulmonary embolism ([place])    Multiple ,  Believed to have emigrated from a chronc thrombus seen on ultrasound of left femoral vein.  No precipitant surgery or immobilization.She is up to date on CA screening.  Hypercoag workup was done during first hospitalization and her DRVVT was prolonged and did improve but not completely correct with the mixing study.  She is now taking  Eliqiuis, unclear how long it will need to be continued,  Will depend on the interpretation of the DRVVT         Relevant Medications   apixaban (ELIQUIS) 5 MG TABS tablet   Cervical spine degeneration    Plain  Films indicate disk herniation at C5-C6 level with left sided foraminal narrowing.  She states that the prednisone taper did not really improve her pain.  The  Pain is rated as a  4/10 and does not radiate down either arm. Tylenol and judicious use of motrin (not more than 3/week)        A total of 25 minutes of face to face time was spent with patient more than half of which was spent in counselling about the above mentioned conditions  and coordination of care   I have discontinued Ms. Advincula's ALPRAZolam and predniSONE. I am also having her maintain her pantoprazole, acidophilus, diazepam, and apixaban.  Meds ordered this encounter  Medications  . apixaban (ELIQUIS) 5 MG TABS tablet    Sig: Take 1 tablet (5 mg total) by mouth 2 (two) times daily.    Dispense:  60 tablet    Refill:  1    Keep on file for future refills    Medications Discontinued During This Encounter  Medication Reason  . pantoprazole (PROTONIX) 40 MG tablet Error  . predniSONE (DELTASONE) 10 MG tablet Error  . ALPRAZolam (XANAX) 0.25 MG tablet   . apixaban (ELIQUIS) 5 MG TABS tablet Reorder    Follow-up: No Follow-up on file.   [name], MD

## 2015-01-28 DIAGNOSIS — H353221 Exudative age-related macular degeneration, left eye, with active choroidal neovascularization: Secondary | ICD-10-CM | POA: Diagnosis not present

## 2015-01-28 DIAGNOSIS — H353211 Exudative age-related macular degeneration, right eye, with active choroidal neovascularization: Secondary | ICD-10-CM | POA: Diagnosis not present

## 2015-02-15 HISTORY — PX: BREAST CYST ASPIRATION: SHX578

## 2015-03-02 DIAGNOSIS — H353221 Exudative age-related macular degeneration, left eye, with active choroidal neovascularization: Secondary | ICD-10-CM | POA: Diagnosis not present

## 2015-03-30 DIAGNOSIS — H353221 Exudative age-related macular degeneration, left eye, with active choroidal neovascularization: Secondary | ICD-10-CM | POA: Diagnosis not present

## 2015-04-06 ENCOUNTER — Encounter: Payer: Self-pay | Admitting: Internal Medicine

## 2015-04-06 ENCOUNTER — Ambulatory Visit (INDEPENDENT_AMBULATORY_CARE_PROVIDER_SITE_OTHER): Payer: Commercial Managed Care - HMO | Admitting: Internal Medicine

## 2015-04-06 VITALS — BP 138/70 | HR 60 | Temp 97.8°F | Resp 12 | Ht 63.0 in | Wt 134.5 lb

## 2015-04-06 DIAGNOSIS — Z1159 Encounter for screening for other viral diseases: Secondary | ICD-10-CM | POA: Diagnosis not present

## 2015-04-06 DIAGNOSIS — Z0000 Encounter for general adult medical examination without abnormal findings: Secondary | ICD-10-CM | POA: Diagnosis not present

## 2015-04-06 DIAGNOSIS — E785 Hyperlipidemia, unspecified: Secondary | ICD-10-CM

## 2015-04-06 DIAGNOSIS — D689 Coagulation defect, unspecified: Secondary | ICD-10-CM

## 2015-04-06 DIAGNOSIS — Z7289 Other problems related to lifestyle: Secondary | ICD-10-CM

## 2015-04-06 DIAGNOSIS — I2699 Other pulmonary embolism without acute cor pulmonale: Secondary | ICD-10-CM

## 2015-04-06 DIAGNOSIS — H3530 Unspecified macular degeneration: Secondary | ICD-10-CM | POA: Diagnosis not present

## 2015-04-06 DIAGNOSIS — M79604 Pain in right leg: Secondary | ICD-10-CM

## 2015-04-06 DIAGNOSIS — J84112 Idiopathic pulmonary fibrosis: Secondary | ICD-10-CM | POA: Diagnosis not present

## 2015-04-06 DIAGNOSIS — I480 Paroxysmal atrial fibrillation: Secondary | ICD-10-CM

## 2015-04-06 DIAGNOSIS — E559 Vitamin D deficiency, unspecified: Secondary | ICD-10-CM | POA: Diagnosis not present

## 2015-04-06 DIAGNOSIS — R5383 Other fatigue: Secondary | ICD-10-CM | POA: Diagnosis not present

## 2015-04-06 DIAGNOSIS — Z1239 Encounter for other screening for malignant neoplasm of breast: Secondary | ICD-10-CM

## 2015-04-06 LAB — LIPID PANEL
CHOL/HDL RATIO: 3
CHOLESTEROL: 266 mg/dL — AB (ref 0–200)
HDL: 81.6 mg/dL (ref >39.00)
LDL CALC: 166 mg/dL — AB (ref 0–99)
NonHDL: 184.65
Triglycerides: 95 mg/dL (ref 0.0–149.0)
VLDL: 19 mg/dL (ref 0.0–40.0)

## 2015-04-06 LAB — COMPREHENSIVE METABOLIC PANEL
ALT: 10 U/L (ref 0–35)
AST: 15 U/L (ref 0–37)
Albumin: 4.4 g/dL (ref 3.5–5.2)
Alkaline Phosphatase: 65 U/L (ref 39–117)
BILIRUBIN TOTAL: 0.7 mg/dL (ref 0.2–1.2)
BUN: 13 mg/dL (ref 6–23)
CALCIUM: 9.5 mg/dL (ref 8.4–10.5)
CHLORIDE: 105 meq/L (ref 96–112)
CO2: 27 meq/L (ref 19–32)
CREATININE: 0.87 mg/dL (ref 0.40–1.20)
GFR: 66.16 mL/min (ref >60.00)
GLUCOSE: 88 mg/dL (ref 70–99)
Potassium: 4.3 mEq/L (ref 3.5–5.1)
SODIUM: 139 meq/L (ref 135–145)
Total Protein: 6.9 g/dL (ref 6.0–8.3)

## 2015-04-06 NOTE — Progress Notes (Signed)
Pre-visit discussion using our clinic review tool. No additional management support is needed unless otherwise documented below in the visit note.  

## 2015-04-06 NOTE — Progress Notes (Signed)
Patient ID: Frances Kent, female    DOB: 1930/07/03  Age: 82 y.o. MRN: [id number]  The patient is here for annual wellness examination and management of other chronic and acute problems.   The risk factors are reflected in the social history.  The roster of all physicians providing medical care to patient - is listed in the Snapshot section of the chart.  Activities of daily living:  The patient is 100% independent in all ADLs: dressing, toileting, feeding as well as independent mobility  Home safety : The patient has smoke detectors in the home. They wear seatbelts.  There are no firearms at home. There is no violence in the home.   There is no risks for hepatitis, STDs or HIV. There is no   history of blood transfusion. They have no travel history to infectious disease endemic areas of the world.  The patient has seen their dentist in the last six month. They have seen their eye doctor in the last year. They admit to slight hearing difficulty with regard to whispered voices and some television programs.  They have deferred audiologic testing in the last year.  They do not  have excessive sun exposure. Discussed the need for sun protection: hats, long sleeves and use of sunscreen if there is significant sun exposure.   Diet: the importance of a healthy diet is discussed. They do have a healthy diet.  The benefits of regular aerobic exercise were discussed. She walks 4 times per week ,  20 minutes.   Depression screen: there are no signs or vegative symptoms of depression- irritability, change in appetite, anhedonia, sadness/tearfullness.  Cognitive assessment: the patient manages all their financial and personal affairs and is actively engaged. They could relate day,date,year and events; recalled 2/3 objects at 3 minutes; performed clock-face test normally.  The following portions of the patient's history were reviewed and updated as appropriate: allergies, current medications, past family  history, past medical history,  past surgical history, past social history  and problem list.  Visual acuity was not assessed per patient preference since she has regular follow up with her ophthalmologist. Hearing and body mass index were assessed and reviewed.   During the course of the visit the patient was educated and counseled about appropriate screening and preventive services including : fall prevention , diabetes screening, nutrition counseling, colorectal cancer screening, and recommended immunizations.    CC: The primary encounter diagnosis was Coagulopathy ([place]). Diagnoses of Macular degeneration, Need for hepatitis C screening test, Vitamin D deficiency, Hyperlipidemia, Other problems related to lifestyle, Other fatigue, Breast cancer screening, Encounter for preventive health examination, Other acute pulmonary embolism ([place]), Hyperlipidemia LDL goal <160, IPF (idiopathic pulmonary fibrosis) ([place]), Pain of right lower extremity, and PAF (paroxysmal atrial fibrillation) ([place]) were also pertinent to this visit.   Two great granchildren living with her,  Does their cooking cleaning and laundry   Stays very busy   Some neck pain if she turns it either way Sleeps on side because prone position hurts her neck  Has IPF; does not get dyspneic with activities if she slows down Sleeps well ; uses valium prn anxious feeling ; averages less than once a week Had eye exam last week , BP was elevated bc eval made her nervous History of  PE;  Oct 2016.  On Eliquis,  Left femoral vein thrombus  Vs PAF,  Thrombus appeared old on dopplers.  No heme eval yet for interpretation of DRVVT study .  Fasting labs        History Shakya has a past medical history of GERD (gastroesophageal reflux disease); IPF (idiopathic pulmonary fibrosis) ([place]) (20111); Pulmonary embolism ([place]); Atrial fibrillation ([place]); and Mitral regurgitation.   She has past surgical history that includes Appendectomy (1960);  Cholecystectomy (1985); and Ovarian cyst removal.   Her family history includes Cancer (age of onset: 16) in her son; Cancer (age of onset: 72) in her mother; Heart disease in her son; Heart disease (age of onset: 69) in her maternal grandmother.She reports that she has never smoked. She has never used smokeless tobacco. She reports that she does not drink alcohol or use illicit drugs.  Outpatient Prescriptions Prior to Visit  Medication Sig Dispense Refill  . acidophilus (RISAQUAD) CAPS capsule Take 1 capsule by mouth daily.    Marland Kitchen apixaban (ELIQUIS) 5 MG TABS tablet Take 1 tablet (5 mg total) by mouth 2 (two) times daily. 60 tablet 1  . diazepam (VALIUM) 5 MG tablet Take 1 tablet (5 mg total) by mouth every 12 (twelve) hours as needed for anxiety or muscle spasms. 60 tablet 1  . pantoprazole (PROTONIX) 40 MG tablet Take 40 mg by mouth daily.     No facility-administered medications prior to visit.    Review of Systems   Patient denies headache, fevers, malaise, unintentional weight loss, skin rash, eye pain, sinus congestion and sinus pain, sore throat, dysphagia,  hemoptysis , cough, dyspnea, wheezing, chest pain, palpitations, orthopnea, edema, abdominal pain, nausea, melena, diarrhea, constipation, flank pain, dysuria, hematuria, urinary  Frequency, nocturia, numbness, tingling, seizures,  Focal weakness, Loss of consciousness,  Tremor, insomnia, depression, anxiety, and suicidal ideation.      Objective:  BP 138/70 mmHg  Pulse 60  Temp(Src) 97.8 F (36.6 C) (Oral)  Resp 12  Ht 5\' 3"  (1.6 m)  Wt 134 lb 8 oz (61.009 kg)  BMI 23.83 kg/m2  SpO2 98%  Physical Exam  General appearance: alert, cooperative and appears stated age Head: Normocephalic, without obvious abnormality, atraumatic Eyes: conjunctivae/corneas clear. PERRL, EOM's intact. Fundi benign. Ears: normal TM's and external ear canals both ears Nose: Nares normal. Septum midline. Mucosa normal. No drainage or sinus  tenderness. Throat: lips, mucosa, and tongue normal; teeth and gums normal Neck: no adenopathy, no carotid bruit, no JVD, supple, symmetrical, trachea midline and thyroid not enlarged, symmetric, no tenderness/mass/nodules Lungs: clear to auscultation bilaterally Breasts: normal appearance, no masses or tenderness Heart: regular rate and rhythm, S1, S2 normal, no murmur, click, rub or gallop Abdomen: soft, non-tender; bowel sounds normal; no masses,  no organomegaly Extremities: extremities normal, atraumatic, no cyanosis or edema Pulses: 2+ and symmetric Skin: Skin color, texture, turgor normal. No rashes or lesions Neurologic: Alert and oriented X 3, normal strength and tone. Normal symmetric reflexes. Normal coordination and gait.    Assessment & Plan:   Problem List Items Addressed This Visit    Hyperlipidemia LDL goal <160    Untreated due to patient preference.   Continue baby aspirin daily.  On Eliquis for treatment of acute PE in October 2016 Lab Results  Component Value Date   CHOL 274* 04/02/2014   HDL 79.90 04/02/2014   LDLCALC 171* 04/02/2014   LDLDIRECT 185.3 03/27/2013   TRIG 114.0 04/02/2014   CHOLHDL 3 04/02/2014   Lab Results  Component Value Date   ALT 15 12/03/2014   AST 14* 12/03/2014   ALKPHOS 85 12/03/2014   BILITOT 0.5 12/03/2014  IPF (idiopathic pulmonary fibrosis) ([place])    She remains asymptomatic with ADLs and room air pulse ox is normal  No further workup at this tinme.       Encounter for preventive health examination    Annual comprehensive preventive exam was done as well as an evaluation and management of chronic conditions .  During the course of the visit the patient was educated and counseled about appropriate screening and preventive services including :  diabetes screening, lipid analysis with projected  10 year  risk for CAD , nutrition counseling, breast, cervical and colorectal cancer screening, and recommended  immunizations.  Printed recommendations for health maintenance screenings was give      PAF (paroxysmal atrial fibrillation) ([place])    Managed by Dr Erin Fulling. Appears to be in sinus rhythm today.  Lab Results  Component Value Date   NA 141 12/05/2014   K 4.0 12/05/2014   CL 108 12/05/2014   CO2 28 12/05/2014   Lab Results  Component Value Date   TSH 6.200* 12/04/2014           RESOLVED: Pain of right lower extremity           Acute pulmonary embolism ([facility])    Multiple ,  Believed to have emigrated from a chronc thrombus seen on ultrasound of left femoral vein.  No precipitant surgery or immobilization.She is up to date on CA screening.  Hypercoag workup was done during first hospitalization and her DRVVT was prolonged and did  not completely correct with the mixing study.  She is now taking  Eliqiuis, unclear how long it will need to be continued,  Will depend on the interpretation of the DRVVT .  Referral to Hematology for their opinion advised and initiated,            Other Visit Diagnoses    Coagulopathy ([place])    -  Primary    Relevant Orders    Ambulatory referral to Hematology    Macular degeneration        Relevant Orders    Ambulatory referral to Ophthalmology    Need for hepatitis C screening test        Vitamin D deficiency        Relevant Orders    VITAMIN D 25 Hydroxy (Vit-D Deficiency, Fractures)    Hyperlipidemia        Relevant Orders    Lipid panel    Other problems related to lifestyle        Relevant Orders    Hepatitis C antibody (Completed)    Other fatigue        Relevant Orders    Comprehensive metabolic panel    Breast cancer screening        Relevant Orders    MM DIGITAL SCREENING BILATERAL       I am having Ms. Mire maintain her pantoprazole, acidophilus, diazepam, and apixaban.  No orders of the defined types were placed in this encounter.    There are no discontinued medications.  Follow-up: No Follow-up on  file.   [name], MD

## 2015-04-06 NOTE — Patient Instructions (Signed)
I am referring you to a hematologist (blood specialist) to help [place] decide how long you need to take the Eliquis  I am ordering your mammogram and your eye doctor referral as well  Menopause is a normal process in which your reproductive ability comes to an end. This process happens gradually over a span of months to years, usually between the ages of 92 and 76. Menopause is complete when you have missed 12 consecutive menstrual periods. It is important to talk with your health care provider about some of the most common conditions that affect postmenopausal women, such as heart disease, cancer, and bone loss (osteoporosis). Adopting a healthy lifestyle and getting preventive care can help to promote your health and wellness. Those actions can also lower your chances of developing some of these common conditions. WHAT SHOULD I KNOW ABOUT MENOPAUSE? During menopause, you may experience a number of symptoms, such as:  Moderate-to-severe hot flashes.  Night sweats.  Decrease in sex drive.  Mood swings.  Headaches.  Tiredness.  Irritability.  Memory problems.  Insomnia. Choosing to treat or not to treat menopausal changes is an individual decision that you make with your health care provider. WHAT SHOULD I KNOW ABOUT HORMONE REPLACEMENT THERAPY AND SUPPLEMENTS? Hormone therapy products are effective for treating symptoms that are associated with menopause, such as hot flashes and night sweats. Hormone replacement carries certain risks, especially as you become older. If you are thinking about using estrogen or estrogen with progestin treatments, discuss the benefits and risks with your health care provider. WHAT SHOULD I KNOW ABOUT HEART DISEASE AND STROKE? Heart disease, heart attack, and stroke become more likely as you age. This may be due, in part, to the hormonal changes that your body experiences during menopause. These can affect how your body processes dietary fats, triglycerides,  and cholesterol. Heart attack and stroke are both medical emergencies. There are many things that you can do to help prevent heart disease and stroke:  Have your blood pressure checked at least every 1-2 years. High blood pressure causes heart disease and increases the risk of stroke.  If you are 70-70 years old, ask your health care provider if you should take aspirin to prevent a heart attack or a stroke.  Do not use any tobacco products, including cigarettes, chewing tobacco, or electronic cigarettes. If you need help quitting, ask your health care provider.  It is important to eat a healthy diet and maintain a healthy weight.  Be sure to include plenty of vegetables, fruits, low-fat dairy products, and lean protein.  Avoid eating foods that are high in solid fats, added sugars, or salt (sodium).  Get regular exercise. This is one of the most important things that you can do for your health.  Try to exercise for at least 150 minutes each week. The type of exercise that you do should increase your heart rate and make you sweat. This is known as moderate-intensity exercise.  Try to do strengthening exercises at least twice each week. Do these in addition to the moderate-intensity exercise.  Know your numbers.Ask your health care provider to check your cholesterol and your blood glucose. Continue to have your blood tested as directed by your health care provider. WHAT SHOULD I KNOW ABOUT CANCER SCREENING? There are several types of cancer. Take the following steps to reduce your risk and to catch any cancer development as early as possible. Breast Cancer  Practice breast self-awareness.  This means understanding how your breasts normally appear  and feel.  It also means doing regular breast self-exams. Let your health care provider know about any changes, no matter how small.  If you are 43 or older, have a clinician do a breast exam (clinical breast exam or CBE) every year. Depending  on your age, family history, and medical history, it may be recommended that you also have a yearly breast X-ray (mammogram).  If you have a family history of breast cancer, talk with your health care provider about genetic screening.  If you are at high risk for breast cancer, talk with your health care provider about having an MRI and a mammogram every year.  Breast cancer (BRCA) gene test is recommended for women who have family members with BRCA-related cancers. Results of the assessment will determine the need for genetic counseling and BRCA1 and for BRCA2 testing. BRCA-related cancers include these types:  Breast. This occurs in males or females.  Ovarian.  Tubal. This may also be called fallopian tube cancer.  Cancer of the abdominal or pelvic lining (peritoneal cancer).  Prostate.  Pancreatic. Cervical, Uterine, and Ovarian Cancer Your health care provider may recommend that you be screened regularly for cancer of the pelvic organs. These include your ovaries, uterus, and vagina. This screening involves a pelvic exam, which includes checking for microscopic changes to the surface of your cervix (Pap test).  For women ages 21-65, health care providers may recommend a pelvic exam and a Pap test every three years. For women ages 81-65, they may recommend the Pap test and pelvic exam, combined with testing for human papilloma virus (HPV), every five years. Some types of HPV increase your risk of cervical cancer. Testing for HPV may also be done on women of any age who have unclear Pap test results.  Other health care providers may not recommend any screening for nonpregnant women who are considered low risk for pelvic cancer and have no symptoms. Ask your health care provider if a screening pelvic exam is right for you.  If you have had past treatment for cervical cancer or a condition that could lead to cancer, you need Pap tests and screening for cancer for at least 20 years after  your treatment. If Pap tests have been discontinued for you, your risk factors (such as having a new sexual partner) need to be reassessed to determine if you should start having screenings again. Some women have medical problems that increase the chance of getting cervical cancer. In these cases, your health care provider may recommend that you have screening and Pap tests more often.  If you have a family history of uterine cancer or ovarian cancer, talk with your health care provider about genetic screening.  If you have vaginal bleeding after reaching menopause, tell your health care provider.  There are currently no reliable tests available to screen for ovarian cancer. Lung Cancer Lung cancer screening is recommended for adults 23-29 years old who are at high risk for lung cancer because of a history of smoking. A yearly low-dose CT scan of the lungs is recommended if you:  Currently smoke.  Have a history of at least 30 pack-years of smoking and you currently smoke or have quit within the past 15 years. A pack-year is smoking an average of one pack of cigarettes per day for one year. Yearly screening should:  Continue until it has been 15 years since you quit.  Stop if you develop a health problem that would prevent you from having lung cancer  treatment. Colorectal Cancer  This type of cancer can be detected and can often be prevented.  Routine colorectal cancer screening usually begins at age 69 and continues through age 29.  If you have risk factors for colon cancer, your health care provider may recommend that you be screened at an earlier age.  If you have a family history of colorectal cancer, talk with your health care provider about genetic screening.  Your health care provider may also recommend using home test kits to check for hidden blood in your stool.  A small camera at the end of a tube can be used to examine your colon directly (sigmoidoscopy or colonoscopy). This  is done to check for the earliest forms of colorectal cancer.  Direct examination of the colon should be repeated every 5-10 years until age 31. However, if early forms of precancerous polyps or small growths are found or if you have a family history or genetic risk for colorectal cancer, you may need to be screened more often. Skin Cancer  Check your skin from head to toe regularly.  Monitor any moles. Be sure to tell your health care provider:  About any new moles or changes in moles, especially if there is a change in a mole's shape or color.  If you have a mole that is larger than the size of a pencil eraser.  If any of your family members has a history of skin cancer, especially at a young age, talk with your health care provider about genetic screening.  Always use sunscreen. Apply sunscreen liberally and repeatedly throughout the day.  Whenever you are outside, protect yourself by wearing long sleeves, pants, a wide-brimmed hat, and sunglasses. WHAT SHOULD I KNOW ABOUT OSTEOPOROSIS? Osteoporosis is a condition in which bone destruction happens more quickly than new bone creation. After menopause, you may be at an increased risk for osteoporosis. To help prevent osteoporosis or the bone fractures that can happen because of osteoporosis, the following is recommended:  If you are 73-82 years old, get at least 1,000 mg of calcium and at least 600 mg of vitamin D per day.  If you are older than age 59 but younger than age 32, get at least 1,200 mg of calcium and at least 600 mg of vitamin D per day.  If you are older than age 74, get at least 1,200 mg of calcium and at least 800 mg of vitamin D per day. Smoking and excessive alcohol intake increase the risk of osteoporosis. Eat foods that are rich in calcium and vitamin D, and do weight-bearing exercises several times each week as directed by your health care provider. WHAT SHOULD I KNOW ABOUT HOW MENOPAUSE AFFECTS [place]? Depression may occur at any age, but it is more common as you become older. Common symptoms of depression include:  Low or sad mood.  Changes in sleep patterns.  Changes in appetite or eating patterns.  Feeling an overall lack of motivation or enjoyment of activities that you previously enjoyed.  Frequent crying spells. Talk with your health care provider if you think that you are experiencing depression. WHAT SHOULD I KNOW ABOUT IMMUNIZATIONS? It is important that you get and maintain your immunizations. These include:  Tetanus, diphtheria, and pertussis (Tdap) booster vaccine.  Influenza every year before the flu season begins.  Pneumonia vaccine.  Shingles vaccine. Your health care provider may also recommend other immunizations.   This information is not intended to replace advice given to you  by your health care provider. Make sure you discuss any questions you have with your health care provider.   Document Released: 03/25/2005 Document Revised: 02/21/2014 Document Reviewed: 10/03/2013 Elsevier Interactive Patient Education [employer].

## 2015-04-07 LAB — VITAMIN D 25 HYDROXY (VIT D DEFICIENCY, FRACTURES): VITD: 25.95 ng/mL — ABNORMAL LOW (ref 30.00–100.00)

## 2015-04-07 LAB — HEPATITIS C ANTIBODY: HCV AB: NEGATIVE

## 2015-04-07 NOTE — Assessment & Plan Note (Signed)
Untreated due to patient preference.   Continue baby aspirin daily.  On Eliquis for treatment of acute PE in October 2016 Lab Results  Component Value Date   CHOL 274* 04/02/2014   HDL 79.90 04/02/2014   LDLCALC 171* 04/02/2014   LDLDIRECT 185.3 03/27/2013   TRIG 114.0 04/02/2014   CHOLHDL 3 04/02/2014   Lab Results  Component Value Date   ALT 15 12/03/2014   AST 14* 12/03/2014   ALKPHOS 85 12/03/2014   BILITOT 0.5 12/03/2014

## 2015-04-07 NOTE — Assessment & Plan Note (Signed)
She remains asymptomatic with ADLs and room air pulse ox is normal  No further workup at this tinme.  

## 2015-04-07 NOTE — Assessment & Plan Note (Signed)
Managed by Dr Erin Fulling. Appears to be in sinus rhythm today.  Lab Results  Component Value Date   NA 141 12/05/2014   K 4.0 12/05/2014   CL 108 12/05/2014   CO2 28 12/05/2014   Lab Results  Component Value Date   TSH 6.200* 12/04/2014

## 2015-04-07 NOTE — Assessment & Plan Note (Signed)
Annual comprehensive preventive exam was done as well as an evaluation and management of chronic conditions .  During the course of the visit the patient was educated and counseled about appropriate screening and preventive services including :  diabetes screening, lipid analysis with projected  10 year  risk for CAD , nutrition counseling, breast, cervical and colorectal cancer screening, and recommended immunizations.  Printed recommendations for health maintenance screenings was give 

## 2015-04-07 NOTE — Assessment & Plan Note (Signed)
Multiple ,  Believed to have emigrated from a chronc thrombus seen on ultrasound of left femoral vein.  No precipitant surgery or immobilization.She is up to date on CA screening.  Hypercoag workup was done during first hospitalization and her DRVVT was prolonged and did  not completely correct with the mixing study.  She is now taking  Eliqiuis, unclear how long it will need to be continued,  Will depend on the interpretation of the DRVVT .  Referral to Hematology for their opinion advised and initiated,

## 2015-04-08 DIAGNOSIS — E559 Vitamin D deficiency, unspecified: Secondary | ICD-10-CM | POA: Insufficient documentation

## 2015-04-08 MED ORDER — ERGOCALCIFEROL 1.25 MG (50000 UT) PO CAPS: 50000.0000 [IU] | ORAL_CAPSULE | ORAL | Status: DC

## 2015-04-08 NOTE — Addendum Note (Signed)
Addended by: [name] on: 04/08/2015 02:28 PM   Modules accepted: Orders

## 2015-04-20 ENCOUNTER — Inpatient Hospital Stay: Payer: Commercial Managed Care - HMO | Attending: Oncology | Admitting: Oncology

## 2015-04-20 VITALS — BP 156/71 | HR 69 | Temp 97.1°F | Resp 16 | Ht 62.99 in | Wt 136.9 lb

## 2015-04-20 DIAGNOSIS — Z79899 Other long term (current) drug therapy: Secondary | ICD-10-CM

## 2015-04-20 DIAGNOSIS — K219 Gastro-esophageal reflux disease without esophagitis: Secondary | ICD-10-CM | POA: Insufficient documentation

## 2015-04-20 DIAGNOSIS — Z86711 Personal history of pulmonary embolism: Secondary | ICD-10-CM | POA: Diagnosis not present

## 2015-04-20 DIAGNOSIS — I2699 Other pulmonary embolism without acute cor pulmonale: Secondary | ICD-10-CM

## 2015-04-20 DIAGNOSIS — I4891 Unspecified atrial fibrillation: Secondary | ICD-10-CM | POA: Diagnosis not present

## 2015-04-20 DIAGNOSIS — Z7901 Long term (current) use of anticoagulants: Secondary | ICD-10-CM

## 2015-04-20 NOTE — Progress Notes (Signed)
Patient was diagnosed with DVT in 11/2014 and her PCP would like for her to have hematology work-up before discontinuing Eliquis.

## 2015-04-26 NOTE — Progress Notes (Signed)
[place]  [phone] [phone]  ID: Frances Kent OB: [birth year]  MR#: [id number]  [id number]  Patient Care Team: [name], MD as PCP - General (Internal Medicine)  CHIEF COMPLAINT:  Chief Complaint  Patient presents with  . New Evaluation    hematology    INTERVAL HISTORY:  Patient is an 82 year old female who was diagnosed with an acute pulmonary embolism in October 2016. No obvious transient risk factor was noted at that time.  Full hypercoagulable workup was negative except for a mildly  prolonged DRVVT that not completely correct with the mixing study. She is currently on Eliqiuis. Currently, she feels well and is asymptomatic. She has no neurologic complaints. She denies any recent fevers or illnesses. She denies any chest pain, shortness of breath, or hemoptysis. She denies any nausea, vomiting, constipation, or diarrhea. She has no urinary complaints. She denies any further leg pain. Patient feels at her baseline and offers no specific complaints today.  REVIEW OF SYSTEMS:   Review of Systems  Constitutional: Negative.  Negative for fever, weight loss and malaise/fatigue.  Respiratory: Negative.  Negative for hemoptysis and shortness of breath.   Cardiovascular: Negative.  Negative for chest pain.  Gastrointestinal: Negative.  Negative for blood in stool and melena.  Musculoskeletal: Negative.   Neurological: Negative.  Negative for weakness.  Psychiatric/Behavioral: Negative.     As per HPI. Otherwise, a complete review of systems is negatve.  PAST MEDICAL HISTORY: Past Medical History  Diagnosis Date  . GERD (gastroesophageal reflux disease)   . IPF (idiopathic pulmonary fibrosis) ([place]) 20111    by CT, PFTS done by Humphrey Rolls  . Pulmonary embolism ([place])   . Atrial fibrillation ([place])   . Mitral regurgitation     PAST SURGICAL HISTORY: Past Surgical History  Procedure Laterality Date  . Appendectomy  1960  .  Cholecystectomy  1985  . Ovarian cyst removal      FAMILY HISTORY Family History  Problem Relation Age of Onset  . Cancer Mother 74    breast cancer, lived to 37,   . Heart disease Maternal Grandmother 58    died of massive MI  . Cancer Son 29    pancreatic cancer  . Heart disease Son     CAD, Tobacco Abuse        ADVANCED DIRECTIVES:    HEALTH MAINTENANCE: Social History  Substance Use Topics  . Smoking status: Never Smoker   . Smokeless tobacco: Never Used     Comment: 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 , worked at [employer], [employer]  . Alcohol Use: No     Colonoscopy:  PAP:  Bone density:  Lipid panel:  No Known Allergies  Current Outpatient Prescriptions  Medication Sig Dispense Refill  . apixaban (ELIQUIS) 5 MG TABS tablet Take 1 tablet (5 mg total) by mouth 2 (two) times daily. 60 tablet 1  . bevacizumab (AVASTIN) 1.25 mg/0.1 mL SOLN by Intravitreal route every 6 (six) weeks. For macular degeneration    . diazepam (VALIUM) 5 MG tablet Take 1 tablet (5 mg total) by mouth every 12 (twelve) hours as needed for anxiety or muscle spasms. 60 tablet 1  . ergocalciferol (DRISDOL) 50000 units capsule Take 1 capsule (50,000 Units total) by mouth once a week. 12 capsule 0  . pantoprazole (PROTONIX) 40 MG tablet Take 40 mg by mouth daily.    Marland Kitchen acidophilus (RISAQUAD) CAPS capsule Take 1 capsule by mouth daily. Reported on 04/20/2015     No  current facility-administered medications for this visit.    OBJECTIVE: Filed Vitals:   04/20/15 1224  BP: 156/71  Pulse: 69  Temp: 97.1 F (36.2 C)  Resp: 16     Body mass index is 24.26 kg/(m^2).    ECOG FS:0 - Asymptomatic  General: Well-developed, well-nourished, no acute distress. Eyes: Pink conjunctiva, anicteric sclera. HEENT: Normocephalic, moist mucous membranes, clear oropharnyx. Lungs: Clear to auscultation bilaterally. Heart: Regular rate and rhythm. No rubs, murmurs, or gallops. Abdomen: Soft, nontender, nondistended. No  organomegaly noted, normoactive bowel sounds. Musculoskeletal: No edema, cyanosis, or clubbing. Neuro: Alert, answering all questions appropriately. Cranial nerves grossly intact. Skin: No rashes or petechiae noted. Psych: Normal affect. Lymphatics: No cervical, calvicular, axillary or inguinal LAD.   LAB RESULTS:  Lab Results  Component Value Date   NA 139 04/06/2015   K 4.3 04/06/2015   CL 105 04/06/2015   CO2 27 04/06/2015   GLUCOSE 88 04/06/2015   BUN 13 04/06/2015   CREATININE 0.87 04/06/2015   CALCIUM 9.5 04/06/2015   PROT 6.9 04/06/2015   ALBUMIN 4.4 04/06/2015   AST 15 04/06/2015   ALT 10 04/06/2015   ALKPHOS 65 04/06/2015   BILITOT 0.7 04/06/2015   GFRNONAA 60* 12/05/2014   GFRAA >60 12/05/2014    Lab Results  Component Value Date   WBC 5.9 12/05/2014   NEUTROABS 3.3 03/27/2013   HGB 12.8 12/05/2014   HCT 38.4 12/05/2014   MCV 89.7 12/05/2014   PLT 251 12/05/2014     STUDIES: No results found.  ASSESSMENT:  Unprovoked pulmonary embolism with prolonged DRVVT  PLAN:    1. Pulmonary embolism:  It is difficult to interpret a prolonged DRVVT in the setting of acute clot as well as patient was likely on anticoagulation at the time of the lab draw.  No transient risk factors were identified and the remainder of the hypercoagulable workup was negative. Patient has been instructed to complete 6 months of anticoagulation in April 2017. She will then return to clinic in mid-May, off Eliquis, for repeat laboratory work further evaluation. At that time, will determine if additional anticoagulation is necessary.  Approximately 45 minutes was spent in discussion of which greater than 50% was consultation.  Patient expressed understanding and was in agreement with this plan. She also understands that She can call clinic at any time with any questions, concerns, or complaints.    Lloyd Huger, MD   04/26/2015 9:03 AM

## 2015-04-29 ENCOUNTER — Other Ambulatory Visit: Payer: Self-pay | Admitting: Internal Medicine

## 2015-05-11 DIAGNOSIS — H353221 Exudative age-related macular degeneration, left eye, with active choroidal neovascularization: Secondary | ICD-10-CM | POA: Diagnosis not present

## 2015-06-15 ENCOUNTER — Ambulatory Visit (INDEPENDENT_AMBULATORY_CARE_PROVIDER_SITE_OTHER): Payer: Commercial Managed Care - HMO

## 2015-06-15 VITALS — BP 122/72 | HR 68 | Temp 98.2°F | Resp 12 | Ht 63.0 in | Wt 136.4 lb

## 2015-06-15 DIAGNOSIS — Z0000 Encounter for general adult medical examination without abnormal findings: Secondary | ICD-10-CM | POA: Diagnosis not present

## 2015-06-15 NOTE — Progress Notes (Signed)
  I have reviewed the above information and agree with above.   Oceania Noori, MD 

## 2015-06-15 NOTE — Progress Notes (Signed)
Subjective:   Frances Kent is a 82 y.o. female who presents for Medicare Annual (Subsequent) preventive examination.  Review of Systems:  No ROS.  Medicare Wellness Visit.  Cardiac Risk Factors include: advanced age (>72men, >76 women)     Objective:     Vitals: BP 122/72 mmHg  Pulse 68  Temp(Src) 98.2 F (36.8 C) (Oral)  Resp 12  Ht 5\' 3"  (1.6 m)  Wt 136 lb 6.4 oz (61.871 kg)  BMI 24.17 kg/m2  SpO2 97%  Body mass index is 24.17 kg/(m^2).   Tobacco History  Smoking status  . Never Smoker   Smokeless tobacco  . Never Used    Comment: 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 , worked at [employer], [employer] given: Not Answered   Past Medical History  Diagnosis Date  . GERD (gastroesophageal reflux disease)   . IPF (idiopathic pulmonary fibrosis) ([place]) 20111    by CT, PFTS done by Humphrey Rolls  . Pulmonary embolism ([place])   . Atrial fibrillation ([place])   . Mitral regurgitation    Past Surgical History  Procedure Laterality Date  . Appendectomy  1960  . Cholecystectomy  1985  . Ovarian cyst removal     Family History  Problem Relation Age of Onset  . Cancer Mother 64    breast cancer, lived to 50,   . Heart disease Maternal Grandmother 60    died of massive MI  . Cancer Son 30    pancreatic cancer  . Heart disease Son     CAD, Tobacco Abuse    History  Sexual Activity  . Sexual Activity: Not Currently    Outpatient Encounter Prescriptions as of 06/15/2015  Medication Sig  . acidophilus (RISAQUAD) CAPS capsule Take 1 capsule by mouth daily. Reported on 04/20/2015  . bevacizumab (AVASTIN) 1.25 mg/0.1 mL SOLN by Intravitreal route every 6 (six) weeks. For macular degeneration  . ergocalciferol (DRISDOL) 50000 units capsule Take 1 capsule (50,000 Units total) by mouth once a week.  . pantoprazole (PROTONIX) 40 MG tablet Take 40 mg by mouth daily.  . diazepam (VALIUM) 5 MG tablet Take 1 tablet (5 mg total) by mouth every 12 (twelve) hours as needed for anxiety or  muscle spasms. (Patient not taking: Reported on 06/15/2015)  . ELIQUIS 5 MG TABS tablet TAKE ONE TABLET BY MOUTH TWICE DAILY (Patient not taking: Reported on 06/15/2015)   No facility-administered encounter medications on file as of 06/15/2015.    Activities of Daily Living In your present state of health, do you have any difficulty performing the following activities: 06/15/2015 12/04/2014  Hearing? N N  Vision? N N  Difficulty concentrating or making decisions? N N  Walking or climbing stairs? N N  Dressing or bathing? N N  Doing errands, shopping? N N  Preparing Food and eating ? N -  Using the Toilet? N -  In the past six months, have you accidently leaked urine? N -  Do you have problems with loss of bowel control? N -  Managing your Medications? N -  Managing your Finances? N -  Housekeeping or managing your Housekeeping? N -    Patient Care Team: [name], MD as PCP - General (Internal Medicine)    Assessment:   This is a routine wellness examination for Frances Kent. The goal of the wellness visit is to assist the patient how to close the gaps in care and create a preventative care plan for the patient.   Taking  VIT D as appropriate/Osteoporosis risk reviewed.  Medications reviewed; taking without issues or barriers. Holding Eliquis as directed by Dr. Grayland Ormond.  Safety issues reviewed; smoke detectors in the home. No firearms in the home. Wears seatbelts when driving or riding with others. No violence in the home.  No identified risk were noted; The patient was oriented x 3; appropriate in dress and manner and no objective failures at ADL's or IADL's.   TDAP and ZOSTAVAX vaccine postponed for follow up with insurance, per patient request.   Patient Concerns:  Chronic neck pain still present; OTC Tylenol for pain, but not taking very often. Intermittent back pain x1 month. Denies pain today.  Curious as to if Eliquis is a contributing factor to her neck/back pain.  Follow  up PCP offered but she states she would rather wait until upcoming visit is complete with Dr. Grayland Ormond and will follow up at that time.  Exercise Activities and Dietary recommendations Current Exercise Habits: The patient does not participate in regular exercise at present  Goals    . Healthy Lifestyle     Stay hydrated and drink plenty of fluids. Low carb foods.  Lean meats and vegetables. Stay active and start walking with your daughter, as tolerated.      Fall Risk Fall Risk  06/15/2015 04/06/2015 04/02/2014 02/13/2013  Falls in the past year? No No No No   Depression Screen PHQ 2/9 Scores 06/15/2015 04/06/2015 04/02/2014 02/13/2013  PHQ - 2 Score 0 0 0 0     Cognitive Testing MMSE - Mini Mental State Exam 06/15/2015  Orientation to time 5  Orientation to Place 5  Registration 3  Attention/ Calculation 5  Recall 3  Language- name 2 objects 2  Language- repeat 1  Language- follow 3 step command 3  Language- read & follow direction 1  Write a sentence 1  Copy design 1  Total score 30    Immunization History  Administered Date(s) Administered  . Pneumococcal Conjugate-13 12/11/2014   Screening Tests Health Maintenance  Topic Date Due  . TETANUS/TDAP  08/31/1949  . ZOSTAVAX  08/30/1992  . INFLUENZA VACCINE  01/01/2016 (Originally 09/15/2015)  . PNA vac Low Risk Adult (2 of 2 - PPSV23) 12/11/2015  . DEXA SCAN  Completed      Plan:   End of life planning; Advance aging; Advanced directives discussed. Copy of current HCPOA/Living Will requested.   During the course of the visit the patient was educated and counseled about the following appropriate screening and preventive services:   Vaccines to include Pneumoccal, Influenza, Hepatitis B, Td, Zostavax, HCV  Electrocardiogram  Cardiovascular Disease  Colorectal cancer screening  Bone density screening  Diabetes screening  Glaucoma screening  Mammography/PAP  Nutrition counseling   Patient Instructions (the  written plan) was given to the patient.   Varney Biles, LPN  [phone]

## 2015-06-15 NOTE — Patient Instructions (Addendum)
Frances Kent , Thank you for taking time to come for your Medicare Wellness Visit. I appreciate your ongoing commitment to your health goals. Please review the following plan we discussed and let me know if I can assist you in the future.   Follow up with Dr. Derrel Nip as needed.   This is a list of the screening recommended for you and due dates:  Health Maintenance  Topic Date Due  . Tetanus Vaccine  08/31/1949  . Shingles Vaccine  08/30/1992  . Flu Shot  01/01/2016*  . Pneumonia vaccines (2 of 2 - PPSV23) 12/11/2015  . DEXA scan (bone density measurement)  Completed  *Topic was postponed. The date shown is not the original due date.    Health Maintenance, Female Adopting a healthy lifestyle and getting preventive care can go a long way to promote health and wellness. Talk with your health care provider about what schedule of regular examinations is right for you. This is a good chance for you to check in with your provider about disease prevention and staying healthy. In between checkups, there are plenty of things you can do on your own. Experts have done a lot of research about which lifestyle changes and preventive measures are most likely to keep you healthy. Ask your health care provider for more information. WEIGHT AND DIET  Eat a healthy diet  Be sure to include plenty of vegetables, fruits, low-fat dairy products, and lean protein.  Do not eat a lot of foods high in solid fats, added sugars, or salt.  Get regular exercise. This is one of the most important things you can do for your health.  Most adults should exercise for at least 150 minutes each week. The exercise should increase your heart rate and make you sweat (moderate-intensity exercise).  Most adults should also do strengthening exercises at least twice a week. This is in addition to the moderate-intensity exercise.  Maintain a healthy weight  Body mass index (BMI) is a measurement that can be used to identify  possible weight problems. It estimates body fat based on height and weight. Your health care provider can help determine your BMI and help you achieve or maintain a healthy weight.  For females 54 years of age and older:   A BMI below 18.5 is considered underweight.  A BMI of 18.5 to 24.9 is normal.  A BMI of 25 to 29.9 is considered overweight.  A BMI of 30 and above is considered obese.  Watch levels of cholesterol and blood lipids  You should start having your blood tested for lipids and cholesterol at 80 years of age, then have this test every 5 years.  You may need to have your cholesterol levels checked more often if:  Your lipid or cholesterol levels are high.  You are older than 80 years of age.  You are at high risk for heart disease.  CANCER SCREENING   Lung Cancer  Lung cancer screening is recommended for adults 42-40 years old who are at high risk for lung cancer because of a history of smoking.  A yearly low-dose CT scan of the lungs is recommended for people who:  Currently smoke.  Have quit within the past 15 years.  Have at least a 30-pack-year history of smoking. A pack year is smoking an average of one pack of cigarettes a day for 1 year.  Yearly screening should continue until it has been 15 years since you quit.  Yearly screening should stop if  you develop a health problem that would prevent you from having lung cancer treatment.  Breast Cancer  Practice breast self-awareness. This means understanding how your breasts normally appear and feel.  It also means doing regular breast self-exams. Let your health care provider know about any changes, no matter how small.  If you are in your 20s or 30s, you should have a clinical breast exam (CBE) by a health care provider every 1-3 years as part of a regular health exam.  If you are 58 or older, have a CBE every year. Also consider having a breast X-ray (mammogram) every year.  If you have a family  history of breast cancer, talk to your health care provider about genetic screening.  If you are at high risk for breast cancer, talk to your health care provider about having an MRI and a mammogram every year.  Breast cancer gene (BRCA) assessment is recommended for women who have family members with BRCA-related cancers. BRCA-related cancers include:  Breast.  Ovarian.  Tubal.  Peritoneal cancers.  Results of the assessment will determine the need for genetic counseling and BRCA1 and BRCA2 testing. Cervical Cancer Your health care provider may recommend that you be screened regularly for cancer of the pelvic organs (ovaries, uterus, and vagina). This screening involves a pelvic examination, including checking for microscopic changes to the surface of your cervix (Pap test). You may be encouraged to have this screening done every 3 years, beginning at age 35.  For women ages 67-65, health care providers may recommend pelvic exams and Pap testing every 3 years, or they may recommend the Pap and pelvic exam, combined with testing for human papilloma virus (HPV), every 5 years. Some types of HPV increase your risk of cervical cancer. Testing for HPV may also be done on women of any age with unclear Pap test results.  Other health care providers may not recommend any screening for nonpregnant women who are considered low risk for pelvic cancer and who do not have symptoms. Ask your health care provider if a screening pelvic exam is right for you.  If you have had past treatment for cervical cancer or a condition that could lead to cancer, you need Pap tests and screening for cancer for at least 20 years after your treatment. If Pap tests have been discontinued, your risk factors (such as having a new sexual partner) need to be reassessed to determine if screening should resume. Some women have medical problems that increase the chance of getting cervical cancer. In these cases, your health care  provider may recommend more frequent screening and Pap tests. Colorectal Cancer  This type of cancer can be detected and often prevented.  Routine colorectal cancer screening usually begins at 80 years of age and continues through 80 years of age.  Your health care provider may recommend screening at an earlier age if you have risk factors for colon cancer.  Your health care provider may also recommend using home test kits to check for hidden blood in the stool.  A small camera at the end of a tube can be used to examine your colon directly (sigmoidoscopy or colonoscopy). This is done to check for the earliest forms of colorectal cancer.  Routine screening usually begins at age 67.  Direct examination of the colon should be repeated every 5-10 years through 80 years of age. However, you may need to be screened more often if early forms of precancerous polyps or small growths are found. Skin  Cancer  Check your skin from head to toe regularly.  Tell your health care provider about any new moles or changes in moles, especially if there is a change in a mole's shape or color.  Also tell your health care provider if you have a mole that is larger than the size of a pencil eraser.  Always use sunscreen. Apply sunscreen liberally and repeatedly throughout the day.  Protect yourself by wearing long sleeves, pants, a wide-brimmed hat, and sunglasses whenever you are outside. HEART DISEASE, DIABETES, AND HIGH BLOOD PRESSURE   High blood pressure causes heart disease and increases the risk of stroke. High blood pressure is more likely to develop in:  People who have blood pressure in the high end of the normal range (130-139/85-89 mm Hg).  People who are overweight or obese.  People who are African American.  If you are 34-3 years of age, have your blood pressure checked every 3-5 years. If you are 36 years of age or older, have your blood pressure checked every year. You should have your  blood pressure measured twice--once when you are at a hospital or clinic, and once when you are not at a hospital or clinic. Record the average of the two measurements. To check your blood pressure when you are not at a hospital or clinic, you can use:  An automated blood pressure machine at a pharmacy.  A home blood pressure monitor.  If you are between 55 years and 23 years old, ask your health care provider if you should take aspirin to prevent strokes.  Have regular diabetes screenings. This involves taking a blood sample to check your fasting blood sugar level.  If you are at a normal weight and have a low risk for diabetes, have this test once every three years after 80 years of age.  If you are overweight and have a high risk for diabetes, consider being tested at a younger age or more often. PREVENTING INFECTION  Hepatitis B  If you have a higher risk for hepatitis B, you should be screened for this virus. You are considered at high risk for hepatitis B if:  You were born in a country where hepatitis B is common. Ask your health care provider which countries are considered high risk.  Your parents were born in a high-risk country, and you have not been immunized against hepatitis B (hepatitis B vaccine).  You have HIV or AIDS.  You use needles to inject street drugs.  You live with someone who has hepatitis B.  You have had sex with someone who has hepatitis B.  You get hemodialysis treatment.  You take certain medicines for conditions, including cancer, organ transplantation, and autoimmune conditions. Hepatitis C  Blood testing is recommended for:  Everyone born from [birth year] through 1963.  Anyone with known risk factors for hepatitis C. Sexually transmitted infections (STIs)  You should be screened for sexually transmitted infections (STIs) including gonorrhea and chlamydia if:  You are sexually active and are younger than 80 years of age.  You are older than 80  years of age and your health care provider tells you that you are at risk for this type of infection.  Your sexual activity has changed since you were last screened and you are at an increased risk for chlamydia or gonorrhea. Ask your health care provider if you are at risk.  If you do not have HIV, but are at risk, it may be recommended that you take a  prescription medicine daily to prevent HIV infection. This is called pre-exposure prophylaxis (PrEP). You are considered at risk if:  You are sexually active and do not regularly use condoms or know the HIV status of your partner(s).  You take drugs by injection.  You are sexually active with a partner who has HIV. Talk with your health care provider about whether you are at high risk of being infected with HIV. If you choose to begin PrEP, you should first be tested for HIV. You should then be tested every 3 months for as long as you are taking PrEP.  PREGNANCY   If you are premenopausal and you may become pregnant, ask your health care provider about preconception counseling.  If you may become pregnant, take 400 to 800 micrograms (mcg) of folic acid every day.  If you want to prevent pregnancy, talk to your health care provider about birth control (contraception). OSTEOPOROSIS AND MENOPAUSE   Osteoporosis is a disease in which the bones lose minerals and strength with aging. This can result in serious bone fractures. Your risk for osteoporosis can be identified using a bone density scan.  If you are 82 years of age or older, or if you are at risk for osteoporosis and fractures, ask your health care provider if you should be screened.  Ask your health care provider whether you should take a calcium or vitamin D supplement to lower your risk for osteoporosis.  Menopause may have certain physical symptoms and risks.  Hormone replacement therapy may reduce some of these symptoms and risks. Talk to your health care provider about whether  hormone replacement therapy is right for you.  HOME CARE INSTRUCTIONS   Schedule regular health, dental, and eye exams.  Stay current with your immunizations.   Do not use any tobacco products including cigarettes, chewing tobacco, or electronic cigarettes.  If you are pregnant, do not drink alcohol.  If you are breastfeeding, limit how much and how often you drink alcohol.  Limit alcohol intake to no more than 1 drink per day for nonpregnant women. One drink equals 12 ounces of beer, 5 ounces of wine, or 1 ounces of hard liquor.  Do not use street drugs.  Do not share needles.  Ask your health care provider for help if you need support or information about quitting drugs.  Tell your health care provider if you often feel depressed.  Tell your health care provider if you have ever been abused or do not feel safe at home.   This information is not intended to replace advice given to you by your health care provider. Make sure you discuss any questions you have with your health care provider.   Document Released: 08/16/2010 Document Revised: 02/21/2014 Document Reviewed: 01/02/2013 Elsevier Interactive Patient Education [employer].

## 2015-06-22 DIAGNOSIS — H353221 Exudative age-related macular degeneration, left eye, with active choroidal neovascularization: Secondary | ICD-10-CM | POA: Diagnosis not present

## 2015-06-24 DIAGNOSIS — E782 Mixed hyperlipidemia: Secondary | ICD-10-CM | POA: Diagnosis not present

## 2015-06-24 DIAGNOSIS — I471 Supraventricular tachycardia: Secondary | ICD-10-CM | POA: Diagnosis not present

## 2015-06-24 DIAGNOSIS — I341 Nonrheumatic mitral (valve) prolapse: Secondary | ICD-10-CM | POA: Diagnosis not present

## 2015-06-24 DIAGNOSIS — I272 Other secondary pulmonary hypertension: Secondary | ICD-10-CM | POA: Diagnosis not present

## 2015-06-24 DIAGNOSIS — I071 Rheumatic tricuspid insufficiency: Secondary | ICD-10-CM | POA: Diagnosis not present

## 2015-06-24 DIAGNOSIS — K219 Gastro-esophageal reflux disease without esophagitis: Secondary | ICD-10-CM | POA: Diagnosis not present

## 2015-06-24 DIAGNOSIS — I050 Rheumatic mitral stenosis: Secondary | ICD-10-CM | POA: Diagnosis not present

## 2015-06-24 DIAGNOSIS — I340 Nonrheumatic mitral (valve) insufficiency: Secondary | ICD-10-CM | POA: Diagnosis not present

## 2015-06-24 DIAGNOSIS — I2699 Other pulmonary embolism without acute cor pulmonale: Secondary | ICD-10-CM | POA: Diagnosis not present

## 2015-06-29 ENCOUNTER — Inpatient Hospital Stay: Payer: Commercial Managed Care - HMO | Attending: Oncology

## 2015-06-29 DIAGNOSIS — K219 Gastro-esophageal reflux disease without esophagitis: Secondary | ICD-10-CM | POA: Diagnosis not present

## 2015-06-29 DIAGNOSIS — I4891 Unspecified atrial fibrillation: Secondary | ICD-10-CM | POA: Diagnosis not present

## 2015-06-29 DIAGNOSIS — Z86711 Personal history of pulmonary embolism: Secondary | ICD-10-CM | POA: Insufficient documentation

## 2015-06-29 DIAGNOSIS — J84112 Idiopathic pulmonary fibrosis: Secondary | ICD-10-CM | POA: Insufficient documentation

## 2015-06-29 DIAGNOSIS — I2699 Other pulmonary embolism without acute cor pulmonale: Secondary | ICD-10-CM

## 2015-06-29 DIAGNOSIS — Z79899 Other long term (current) drug therapy: Secondary | ICD-10-CM | POA: Diagnosis not present

## 2015-06-30 LAB — CARDIOLIPIN ANTIBODIES, IGG, IGM, IGA
ANTICARDIOLIPIN IGM: 10 [MPL'U]/mL (ref 0–12)
Anticardiolipin IgA: <9 APL U/mL (ref 0–11)

## 2015-06-30 LAB — LUPUS ANTICOAGULANT PANEL
DRVVT: 34.1 s (ref 0.0–47.0)
PTT LA: 33.6 s (ref 0.0–43.6)

## 2015-07-01 ENCOUNTER — Inpatient Hospital Stay (HOSPITAL_BASED_OUTPATIENT_CLINIC_OR_DEPARTMENT_OTHER): Payer: Commercial Managed Care - HMO | Admitting: Oncology

## 2015-07-01 VITALS — BP 155/76 | HR 75 | Temp 96.5°F | Resp 16 | Wt 138.4 lb

## 2015-07-01 DIAGNOSIS — Z79899 Other long term (current) drug therapy: Secondary | ICD-10-CM | POA: Diagnosis not present

## 2015-07-01 DIAGNOSIS — Z86711 Personal history of pulmonary embolism: Secondary | ICD-10-CM | POA: Diagnosis not present

## 2015-07-01 DIAGNOSIS — I2699 Other pulmonary embolism without acute cor pulmonale: Secondary | ICD-10-CM

## 2015-07-01 DIAGNOSIS — J84112 Idiopathic pulmonary fibrosis: Secondary | ICD-10-CM | POA: Diagnosis not present

## 2015-07-01 DIAGNOSIS — I4891 Unspecified atrial fibrillation: Secondary | ICD-10-CM | POA: Diagnosis not present

## 2015-07-01 DIAGNOSIS — K219 Gastro-esophageal reflux disease without esophagitis: Secondary | ICD-10-CM | POA: Diagnosis not present

## 2015-07-01 NOTE — Progress Notes (Signed)
Patient does not offer any problems today.  

## 2015-07-05 NOTE — Progress Notes (Signed)
[facility]  [phone] [phone]) [phone]  ID: [name] OB: 03-13-1930  MR#: [id number]  [id number]  Patient Care Team: [name], MD as PCP - General (Internal Medicine)  CHIEF COMPLAINT:  No chief complaint on file.   INTERVAL HISTORY:  Patient returns to clinic today for further evaluation, discussion of her laboratory work and to assess her need for additional anticoagulation. She discontinued Eliqiuis approximately 3 weeks ago. Currently, she feels well and is asymptomatic. She has no neurologic complaints. She denies any recent fevers or illnesses. She denies any chest pain, shortness of breath, or hemoptysis. She denies any nausea, vomiting, constipation, or diarrhea. She has no urinary complaints. She denies any further leg pain. Patient feels at her baseline and offers no specific complaints today.  REVIEW OF SYSTEMS:   Review of Systems  Constitutional: Negative.  Negative for fever, weight loss and malaise/fatigue.  Respiratory: Negative.  Negative for hemoptysis and shortness of breath.   Cardiovascular: Negative.  Negative for chest pain.  Gastrointestinal: Negative.  Negative for blood in stool and melena.  Musculoskeletal: Negative.   Neurological: Negative.  Negative for weakness.  Psychiatric/Behavioral: Negative.     As per HPI. Otherwise, a complete review of systems is negatve.  PAST MEDICAL HISTORY: Past Medical History  Diagnosis Date  . GERD (gastroesophageal reflux disease)   . IPF (idiopathic pulmonary fibrosis) ([place]) 20111    by CT, PFTS done by Humphrey Rolls  . Pulmonary embolism ([place])   . Atrial fibrillation ([employer])   . Mitral regurgitation     PAST SURGICAL HISTORY: Past Surgical History  Procedure Laterality Date  . Appendectomy  1960  . Cholecystectomy  1985  . Ovarian cyst removal      FAMILY HISTORY Family History  Problem Relation Age of Onset  . Cancer Mother 61    breast cancer, lived to 63,    . Heart disease Maternal Grandmother 16    died of massive MI  . Cancer Son 49    pancreatic cancer  . Heart disease Son     CAD, Tobacco Abuse        ADVANCED DIRECTIVES:    HEALTH MAINTENANCE: Social History  Substance Use Topics  . Smoking status: Never Smoker   . Smokeless tobacco: Never Used     Comment: 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 , worked at [employer], [employer]  . Alcohol Use: No     Colonoscopy:  PAP:  Bone density:  Lipid panel:  No Known Allergies  Current Outpatient Prescriptions  Medication Sig Dispense Refill  . acidophilus (RISAQUAD) CAPS capsule Take 1 capsule by mouth daily. Reported on 04/20/2015    . bevacizumab (AVASTIN) 1.25 mg/0.1 mL SOLN by Intravitreal route every 6 (six) weeks. For macular degeneration    . diazepam (VALIUM) 5 MG tablet Take 1 tablet (5 mg total) by mouth every 12 (twelve) hours as needed for anxiety or muscle spasms. 60 tablet 1  . ergocalciferol (DRISDOL) 50000 units capsule Take 1 capsule (50,000 Units total) by mouth once a week. 12 capsule 0  . pantoprazole (PROTONIX) 40 MG tablet Take 40 mg by mouth daily.     No current facility-administered medications for this visit.    OBJECTIVE: Filed Vitals:   07/01/15 1056  BP: 155/76  Pulse: 75  Temp: 96.5 F (35.8 C)  Resp: 16     Body mass index is 24.53 kg/(m^2).    ECOG FS:0 - Asymptomatic  General: Well-developed, well-nourished, no acute distress. Eyes: Pink  conjunctiva, anicteric sclera. Lungs: Clear to auscultation bilaterally. Heart: Regular rate and rhythm. No rubs, murmurs, or gallops. Abdomen: Soft, nontender, nondistended. No organomegaly noted, normoactive bowel sounds. Musculoskeletal: No edema, cyanosis, or clubbing. Neuro: Alert, answering all questions appropriately. Cranial nerves grossly intact. Skin: No rashes or petechiae noted. Psych: Normal affect.  LAB RESULTS:  Lab Results  Component Value Date   NA 139 04/06/2015   K 4.3 04/06/2015   CL 105  04/06/2015   CO2 27 04/06/2015   GLUCOSE 88 04/06/2015   BUN 13 04/06/2015   CREATININE 0.87 04/06/2015   CALCIUM 9.5 04/06/2015   PROT 6.9 04/06/2015   ALBUMIN 4.4 04/06/2015   AST 15 04/06/2015   ALT 10 04/06/2015   ALKPHOS 65 04/06/2015   BILITOT 0.7 04/06/2015   GFRNONAA 60* 12/05/2014   GFRAA >60 12/05/2014    Lab Results  Component Value Date   WBC 5.9 12/05/2014   NEUTROABS 3.3 03/27/2013   HGB 12.8 12/05/2014   HCT 38.4 12/05/2014   MCV 89.7 12/05/2014   PLT 251 12/05/2014     STUDIES: No results found.  ASSESSMENT:  Unprovoked pulmonary embolism.  PLAN:    1. Pulmonary embolism:  Repeat laboratory work is now within normal limits.  No transient risk factors were identified and the remainder of the hypercoagulable workup was also negative. Patient has now completed 6 months of anticoagulation. After lengthy discussion with the patient, she agreed that no further anticoagulation is necessary. She also expressed understanding that she is increased risk of blood clot given her personal history over the general population. Prophylactic measures particularly for travel and surgery were discussed at length. No further follow-up is necessary.    Patient expressed understanding and was in agreement with this plan. She also understands that She can call clinic at any time with any questions, concerns, or complaints.    Lloyd Huger, MD   07/05/2015 10:44 AM

## 2015-07-27 DIAGNOSIS — H353221 Exudative age-related macular degeneration, left eye, with active choroidal neovascularization: Secondary | ICD-10-CM | POA: Diagnosis not present

## 2015-09-11 DIAGNOSIS — H353221 Exudative age-related macular degeneration, left eye, with active choroidal neovascularization: Secondary | ICD-10-CM | POA: Diagnosis not present

## 2015-10-23 DIAGNOSIS — H353221 Exudative age-related macular degeneration, left eye, with active choroidal neovascularization: Secondary | ICD-10-CM | POA: Diagnosis not present

## 2015-11-23 ENCOUNTER — Encounter: Payer: Self-pay | Admitting: Family

## 2015-11-23 ENCOUNTER — Ambulatory Visit (INDEPENDENT_AMBULATORY_CARE_PROVIDER_SITE_OTHER): Payer: Commercial Managed Care - HMO | Admitting: Family

## 2015-11-23 VITALS — BP 98/68 | HR 72 | Temp 98.0°F | Ht 63.0 in | Wt 132.2 lb

## 2015-11-23 DIAGNOSIS — K5900 Constipation, unspecified: Secondary | ICD-10-CM | POA: Diagnosis not present

## 2015-11-23 NOTE — Progress Notes (Signed)
Subjective:    Patient ID: Frances Kent, female    DOB: 1930-09-05, 82 y.o.   MRN: [id number]  CC: Frances Kent is a 82 y.o. female who presents today for an acute visit.    HPI: Patient here for acute visit for chief complaint constipation for 2 months, intermittently. Endorses increased gas and makes 'hungry, growling sounds.'  Had small BM today. Taken twice for senna which worked.  No fever, chills, abdominal pain, blood in stool. Walks for exercise. Doesn't 'drink a lot of water' or eat a lot of high fiber foods.  Stools are not hard.        GERD- well controlled. 'No trouble.' Hasn't taken protonix in 3 weeks. No belching, sore throat, hoarseness. No chest pain, SOB.            Colonoscopy 2013. Patient had 3 polyps. Negative for high grade dysplasia and malignancy.Noted diverticulosis. Was told she didn't have to come back ever if she had no symptoms.   HISTORY:  Past Medical History:  Diagnosis Date  . Atrial fibrillation ([place])   . GERD (gastroesophageal reflux disease)   . IPF (idiopathic pulmonary fibrosis) ([place]) 20111   by CT, PFTS done by Humphrey Rolls  . Mitral regurgitation   . Pulmonary embolism [facility])    Past Surgical History:  Procedure Laterality Date  . APPENDECTOMY  1960  . CHOLECYSTECTOMY  1985  . OVARIAN CYST REMOVAL     Family History  Problem Relation Age of Onset  . Cancer Mother 8    breast cancer, lived to 33,   . Heart disease Maternal Grandmother 17    died of massive MI  . Cancer Son 51    pancreatic cancer  . Heart disease Son     CAD, Tobacco Abuse     Allergies: Review of patient's allergies indicates no known allergies. Current Outpatient Prescriptions on File Prior to Visit  Medication Sig Dispense Refill  . acidophilus (RISAQUAD) CAPS capsule Take 1 capsule by mouth daily. Reported on 04/20/2015    . bevacizumab (AVASTIN) 1.25 mg/0.1 mL SOLN by Intravitreal route every 6 (six) weeks. For macular degeneration    . diazepam (VALIUM) 5 MG  tablet Take 1 tablet (5 mg total) by mouth every 12 (twelve) hours as needed for anxiety or muscle spasms. 60 tablet 1  . pantoprazole (PROTONIX) 40 MG tablet Take 40 mg by mouth daily.     No current facility-administered medications on file prior to visit.     Social History  Substance Use Topics  . Smoking status: Never Smoker  . Smokeless tobacco: Never Used     Comment: 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 , worked at [employer], [employer]  . Alcohol use No    Review of Systems  Constitutional: Negative for chills and fever.  Respiratory: Negative for cough.   Cardiovascular: Negative for chest pain and palpitations.  Gastrointestinal: Positive for constipation. Negative for abdominal distention, blood in stool, nausea and vomiting.      Objective:    BP 98/68   Pulse 72   Temp 98 F (36.7 C) (Oral)   Ht 5\' 3"  (1.6 m)   Wt 132 lb 3.2 oz (60 kg)   SpO2 99%   BMI 23.42 kg/m    Physical Exam  Constitutional: She appears well-developed and well-nourished.  Eyes: Conjunctivae are normal.  Cardiovascular: Normal rate, regular rhythm, normal heart sounds and normal pulses.   Pulmonary/Chest: Effort normal and breath sounds normal. She has no wheezes. She  has no rhonchi. She has no rales.  Abdominal: Soft. Normal appearance and bowel sounds are normal. She exhibits no distension, no fluid wave, no ascites and no mass. There is no tenderness. There is no rigidity, no rebound, no guarding and no CVA tenderness.  Neurological: She is alert.  Skin: Skin is warm and dry.  Psychiatric: She has a normal mood and affect. Her speech is normal and behavior is normal. Thought content normal.  Vitals reviewed.      Assessment & Plan:   1. Constipation, unspecified constipation type Resolved. Reassured the patient is not having abdominal pain, distention. Education given on constipation prevention and I encouraged her to increase her water intake and fiber intake. Patient and I discussed that if  constipation continues or recurs, she'll make follow-up appointment PCP to discuss further management. I advised that she may need a repeat colonoscopy if this is not resolve lifestyle modifications.patient verbalized understanding of plan.    I have discontinued [name]'s ergocalciferol. I am also having her maintain her pantoprazole, acidophilus, diazepam, and bevacizumab.   No orders of the defined types were placed in this encounter.   Return precautions given.   Risks, benefits, and alternatives of the medications and treatment plan prescribed today were discussed, and patient expressed understanding.   Education regarding symptom management and diagnosis given to patient on AVS.  Continue to follow with TULLO, Aris Everts, MD for routine health maintenance.   Frances Kent and I agreed with plan.   Frances Paris, FNP

## 2015-11-23 NOTE — Patient Instructions (Addendum)
If you continue to have problems with constipation. Please see Dr. Derrel Nip.   Constipation plan  1) Take Miralax once to twice per day until you have a bowel movement. You will end up titrating the use of Miralax. For example, you  may find that using the medication every other day or three times a week is a good bowel regimen for you. Or perhaps, twice weekly.   2)  If you do not get results with Miralax alone, you may then add Bisacodyl suppository daily to regimen until you get desired bowel results.  3) Take Colace ( stool softener) twice daily every day.   It is MOST important to drink LOTS of water and follow a HIGH fiber diet to keep foods moving through the gut. You may add Metamucil to a beverage that you drink.  Information on prevention of constipation as well as acute treatment for constipation as included below.  If there is no improvement in your symptoms, or if there is any worsening of symptoms, or if you have any additional concerns, please return to this clinic for re-evaluation; or, if we are closed, consider going to the Emergency Room for evaluation.    Constipation Prevention What is Constipation? Constipation is hard, dry bowel movements or the inability to have a bowel movement.  You can also feel like you need to have a bowel movement but not be able to.  It can also be painful when you strain to have a bowel movement.  Taking narcotic pain medicine after surgery can make you constipated, even if you have never had a problem with constipation. What Do I Need To Do? The best thing to do for constipation is to keep it from happening.  This can be done by: Adding laxatives to your daily routine, when taking prescription pain medicines after surgery. Add 17 gm Miralax daily or 100 mg Colace once or twice daily. (Miralax is mixed in water. Colace is a pill). They soften your bowel movements to make them easier to pass and hurt less. Drink plenty of water to help flush your  bowels.  (Eight, 8 ounce glasses daily) Eat foods high in fiber such as whole grains, vegetables, cereals, fruits, and prune juice (5-7 servings a day or 25 grams).  If you do not know how much fiber a food has in it you can look on the label under dietary fiber.  If you have trouble getting enough fiber in your diet you may want to consider a fiber supplement such as Metamucil or Citrucel.  Also, be aware that eating fiber without drinking enough water can make constipation worse. If you do become constipated some medications that may help are: Bisacodyl (Dulcolax) is available in tablet form or a suppository. Glycerin suppositories are also a good choice if you need a fast acting medication. Everybody is different and may have different results.  Talk to your pharmacist or health care provider about your specific problems. They can help you choose the best product for you.  Why Is It Important for Me To Do This? Being constipated is not something you have to live with.  There are many things you can do to help.   Feeling bad can interfere with your recovery after surgery.  If constipation goes on for too long it can become a very serious medical problem. You may need to visit your doctor or go to the hospital.  That is why it is very important to drink lots of water, eat  enough fiber, and keep it from happening.  Ask Questions We want to answer all of your questions and concerns.  Thats why we encourage you to use a program called Ask Me 3, created by the [employer].  By using Ask Me 3 you are encouraged to ask 3 simple (yet, potentially life saving questions) whenever you are talking with your physician, nurse or pharmacist: What is my main problem? What do I need to do? Why is it important for me to do this? By understanding the answer to these three questions and any other questions you may have, you have the knowledge necessary to manage your health. Please  feel very comfortable asking any questions. Healthcare is complicated, so if you hear an answer you do not understand, please ask your health care team to explain again.   Sources: [employer] 12-03-09 N

## 2015-11-30 DIAGNOSIS — R002 Palpitations: Secondary | ICD-10-CM | POA: Diagnosis not present

## 2015-11-30 DIAGNOSIS — K219 Gastro-esophageal reflux disease without esophagitis: Secondary | ICD-10-CM | POA: Diagnosis not present

## 2015-11-30 DIAGNOSIS — E782 Mixed hyperlipidemia: Secondary | ICD-10-CM | POA: Diagnosis not present

## 2015-11-30 DIAGNOSIS — R000 Tachycardia, unspecified: Secondary | ICD-10-CM | POA: Diagnosis not present

## 2015-11-30 DIAGNOSIS — R0602 Shortness of breath: Secondary | ICD-10-CM | POA: Diagnosis not present

## 2015-12-09 DIAGNOSIS — R0602 Shortness of breath: Secondary | ICD-10-CM | POA: Diagnosis not present

## 2015-12-09 DIAGNOSIS — R000 Tachycardia, unspecified: Secondary | ICD-10-CM | POA: Diagnosis not present

## 2015-12-09 DIAGNOSIS — R002 Palpitations: Secondary | ICD-10-CM | POA: Diagnosis not present

## 2015-12-11 DIAGNOSIS — H353221 Exudative age-related macular degeneration, left eye, with active choroidal neovascularization: Secondary | ICD-10-CM | POA: Diagnosis not present

## 2015-12-21 ENCOUNTER — Ambulatory Visit
Admission: RE | Admit: 2015-12-21 | Discharge: 2015-12-21 | Disposition: A | Discharge status: Home / Self Care | Payer: Commercial Managed Care - HMO | Source: Ambulatory Visit | Attending: Family | Admitting: Family

## 2015-12-21 ENCOUNTER — Telehealth: Payer: Self-pay | Admitting: Internal Medicine

## 2015-12-21 ENCOUNTER — Ambulatory Visit (INDEPENDENT_AMBULATORY_CARE_PROVIDER_SITE_OTHER): Payer: Commercial Managed Care - HMO | Admitting: Family

## 2015-12-21 ENCOUNTER — Encounter: Payer: Self-pay | Admitting: Family

## 2015-12-21 VITALS — BP 140/66 | HR 72 | Temp 98.4°F | Ht 63.0 in | Wt 134.8 lb

## 2015-12-21 DIAGNOSIS — I8001 Phlebitis and thrombophlebitis of superficial vessels of right lower extremity: Secondary | ICD-10-CM | POA: Insufficient documentation

## 2015-12-21 DIAGNOSIS — I82512 Chronic embolism and thrombosis of left femoral vein: Secondary | ICD-10-CM | POA: Insufficient documentation

## 2015-12-21 DIAGNOSIS — R7989 Other specified abnormal findings of blood chemistry: Secondary | ICD-10-CM

## 2015-12-21 DIAGNOSIS — M25471 Effusion, right ankle: Secondary | ICD-10-CM | POA: Diagnosis not present

## 2015-12-21 DIAGNOSIS — M7989 Other specified soft tissue disorders: Secondary | ICD-10-CM | POA: Diagnosis not present

## 2015-12-21 LAB — D-DIMER, QUANTITATIVE (NOT AT ARMC): D DIMER QUANT: 1.1 ug{FEU}/mL — AB (ref <0.50)

## 2015-12-21 NOTE — Telephone Encounter (Signed)
Received call from after hours for positive d-dimer. Reviewed notes and [place] from today. Chief complaint leg swelling and [place] negative for acute DVT. Ordered CT chest due to hx PE and A fib and will forward to PCP and treating provider for review and if they feel not necessary they can cancel. No anticoagulation ordered due to negative [place] and no symptoms of PE at visit.

## 2015-12-21 NOTE — Progress Notes (Signed)
Subjective:    Patient ID: Frances Kent, female    DOB: 22-Apr-1930, 82 y.o.   MRN: [id number]  CC: [name] is a 82 y.o. female who presents today for an acute visit.    HPI: Patient here for acute visit with chief complaint of right ankle swelling for 3 days, worsening. Calf isn't swollen. Hasn't tried elevation. No injury or pain in ankle. No SOB, fever, chest pain.   H/o PE and paroxysmal atrial fib.Completed elliquis in 05/2015.      HISTORY:  Past Medical History:  Diagnosis Date  . Atrial fibrillation ([place])   . GERD (gastroesophageal reflux disease)   . IPF (idiopathic pulmonary fibrosis) ([place]) 20111   by CT, PFTS done by Humphrey Rolls  . Mitral regurgitation   . Pulmonary embolism [facility])    Past Surgical History:  Procedure Laterality Date  . APPENDECTOMY  1960  . CHOLECYSTECTOMY  1985  . OVARIAN CYST REMOVAL     Family History  Problem Relation Age of Onset  . Cancer Mother 50    breast cancer, lived to 25,   . Cancer Son 68    pancreatic cancer  . Heart disease Son     CAD, Tobacco Abuse   . Heart disease Maternal Grandmother 89    died of massive MI    Allergies: Patient has no known allergies. Current Outpatient Prescriptions on File Prior to Visit  Medication Sig Dispense Refill  . acidophilus (RISAQUAD) CAPS capsule Take 1 capsule by mouth daily. Reported on 04/20/2015    . bevacizumab (AVASTIN) 1.25 mg/0.1 mL SOLN by Intravitreal route every 6 (six) weeks. For macular degeneration    . diazepam (VALIUM) 5 MG tablet Take 1 tablet (5 mg total) by mouth every 12 (twelve) hours as needed for anxiety or muscle spasms. 60 tablet 1  . pantoprazole (PROTONIX) 40 MG tablet Take 40 mg by mouth daily.     No current facility-administered medications on file prior to visit.     Social History  Substance Use Topics  . Smoking status: Never Smoker  . Smokeless tobacco: Never Used     Comment: 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 , worked at [employer], [employer]  . Alcohol use  No    Review of Systems  Constitutional: Negative for chills and fever.  Respiratory: Negative for cough, shortness of breath and wheezing.   Cardiovascular: Positive for leg swelling. Negative for chest pain and palpitations.  Gastrointestinal: Negative for nausea and vomiting.  Neurological: Negative for dizziness.      Objective:    BP 140/66   Pulse 72   Temp 98.4 F (36.9 C) (Oral)   Ht 5\' 3"  (1.6 m)   Wt 134 lb 12.8 oz (61.1 kg)   SpO2 98%   BMI 23.88 kg/m    Physical Exam  Constitutional: She appears well-developed and well-nourished.  Eyes: Conjunctivae are normal.  Cardiovascular: Normal rate, regular rhythm, normal heart sounds and normal pulses.   Right ankle LE trace edema over lateral malleolus . No left LE edema. No palpable cords or masses. No erythema or increased warmth. No asymmetry in calf size when compared bilaterally LE hair growth symmetric and present. No discoloration of varicosities noted. LE warm and palpable pedal pulses.   Pulmonary/Chest: Effort normal and breath sounds normal. She has no wheezes. She has no rhonchi. She has no rales.  Neurological: She is alert.  Skin: Skin is warm and dry.  Psychiatric: She has a normal mood and affect. Her  speech is normal and behavior is normal. Thought content normal.  Vitals reviewed.      Assessment & Plan:   Problem List Items Addressed This Visit      Other   Right ankle swelling - Primary    HR 72. No chest pain or SOB. + 1 edema. No injury. Swelling idiopathic. Will treat conservatively with ice, ACE, compression. Wells criteria low risk for PE < 2. Due to h/o PE and afib, pending D dimer and Right [place].       Relevant Orders   US Venous Img Lower Unilateral Right   D-dimer, quantitative (not at [facility])        I am having Ms. Albro maintain her pantoprazole, acidophilus, diazepam, bevacizumab, and Vitamin D3.   Meds ordered this encounter  Medications  . Cholecalciferol (VITAMIN  D3) 1000 units CAPS    Sig: Take by mouth.    Return precautions given.   Risks, benefits, and alternatives of the medications and treatment plan prescribed today were discussed, and patient expressed understanding.   Education regarding symptom management and diagnosis given to patient on AVS.  Continue to follow with TULLO, Aris Everts, MD for routine health maintenance.   Frances Kent and I agreed with plan.   Mable Paris, FNP

## 2015-12-21 NOTE — Assessment & Plan Note (Addendum)
HR 72. No chest pain or SOB. + 1 edema. No injury. Swelling idiopathic. Will treat conservatively with ice, ACE, compression. Wells criteria low risk for PE < 2. Due to h/o PE and afib, pending D dimer and Right [place].

## 2015-12-21 NOTE — Patient Instructions (Signed)
Ultrasound of right leg  ACE wrap  Ice.  If there is no improvement in your symptoms, or if there is any worsening of symptoms, or if you have any additional concerns, please return for re-evaluation; or, if we are closed, consider going to the Emergency Room for evaluation if symptoms urgent.

## 2015-12-21 NOTE — Telephone Encounter (Signed)
Thanks Dr Sharlet Salina.  Spoke with patient tonight, and she is not having SOB,CP, palpitations.   She plans to have CT chest tomorrow.

## 2015-12-21 NOTE — Progress Notes (Signed)
Pre visit review using our clinic review tool, if applicable. No additional management support is needed unless otherwise documented below in the visit note. 

## 2015-12-22 ENCOUNTER — Ambulatory Visit: Admission: RE | Admit: 2015-12-22 | Payer: Commercial Managed Care - HMO | Source: Ambulatory Visit

## 2015-12-22 ENCOUNTER — Telehealth: Payer: Self-pay | Admitting: Family

## 2015-12-22 ENCOUNTER — Ambulatory Visit
Admission: RE | Admit: 2015-12-22 | Discharge: 2015-12-22 | Disposition: A | Discharge status: Home / Self Care | Payer: Commercial Managed Care - HMO | Source: Ambulatory Visit | Attending: Internal Medicine | Admitting: Internal Medicine

## 2015-12-22 ENCOUNTER — Telehealth: Payer: Self-pay | Admitting: *Deleted

## 2015-12-22 DIAGNOSIS — R7989 Other specified abnormal findings of blood chemistry: Secondary | ICD-10-CM

## 2015-12-22 DIAGNOSIS — R590 Localized enlarged lymph nodes: Secondary | ICD-10-CM | POA: Diagnosis not present

## 2015-12-22 DIAGNOSIS — I2510 Atherosclerotic heart disease of native coronary artery without angina pectoris: Secondary | ICD-10-CM | POA: Diagnosis not present

## 2015-12-22 DIAGNOSIS — R600 Localized edema: Secondary | ICD-10-CM | POA: Diagnosis not present

## 2015-12-22 DIAGNOSIS — M25471 Effusion, right ankle: Secondary | ICD-10-CM

## 2015-12-22 LAB — POCT I-STAT CREATININE: CREATININE: 0.9 mg/dL (ref 0.44–1.00)

## 2015-12-22 MED ORDER — IOPAMIDOL (ISOVUE-370) INJECTION 76%
75.0000 mL | Freq: Once | INTRAVENOUS | Status: AC | PRN
  Administered 2015-12-22: 75 mL via INTRAVENOUS

## 2015-12-22 NOTE — Telephone Encounter (Signed)
Order in as requested.

## 2015-12-22 NOTE — Telephone Encounter (Signed)
I called patient from my cell phone and reviewed results with her including positive d-dimer. She denies shortness of breath, chest pain, palpitations. Pending CT of chest. Advised patient to overnight to go to emergency room if she experiences any of those symptoms. We also discussed that her LE ultrasound showed superficial phlebitis, chronic, stable  nonocclusive DVT in the contralateral left common femoral vein. No right DVT.

## 2015-12-22 NOTE — Telephone Encounter (Signed)
Frances Kent from [place] has requested a creatinine order place.

## 2015-12-24 ENCOUNTER — Telehealth: Payer: Self-pay | Admitting: Internal Medicine

## 2015-12-24 NOTE — Telephone Encounter (Signed)
Pt called and left a vm looking for results of her Ct scan. Thank you!  Call pt @ [phone]

## 2015-12-24 NOTE — Telephone Encounter (Signed)
Called and spoke with the pt and informed her of recent CT results and note.//AB/CMA

## 2016-01-13 DIAGNOSIS — R002 Palpitations: Secondary | ICD-10-CM | POA: Diagnosis not present

## 2016-01-13 DIAGNOSIS — I341 Nonrheumatic mitral (valve) prolapse: Secondary | ICD-10-CM | POA: Diagnosis not present

## 2016-01-13 DIAGNOSIS — I471 Supraventricular tachycardia: Secondary | ICD-10-CM | POA: Diagnosis not present

## 2016-01-13 DIAGNOSIS — R0602 Shortness of breath: Secondary | ICD-10-CM | POA: Diagnosis not present

## 2016-01-13 DIAGNOSIS — I10 Essential (primary) hypertension: Secondary | ICD-10-CM | POA: Diagnosis not present

## 2016-01-29 DIAGNOSIS — H353221 Exudative age-related macular degeneration, left eye, with active choroidal neovascularization: Secondary | ICD-10-CM | POA: Diagnosis not present

## 2016-02-15 DIAGNOSIS — I2699 Other pulmonary embolism without acute cor pulmonale: Secondary | ICD-10-CM

## 2016-02-15 HISTORY — DX: Other pulmonary embolism without acute cor pulmonale: I26.99

## 2016-03-04 ENCOUNTER — Emergency Department: Payer: Medicare HMO

## 2016-03-04 ENCOUNTER — Encounter: Payer: Self-pay | Admitting: Medical Oncology

## 2016-03-04 ENCOUNTER — Inpatient Hospital Stay: Payer: Medicare HMO

## 2016-03-04 ENCOUNTER — Inpatient Hospital Stay
Admission: EM | Admit: 2016-03-04 | Discharge: 2016-03-06 | DRG: 291 | Disposition: A | Discharge status: Home / Self Care | Payer: Medicare HMO | Attending: Internal Medicine | Admitting: Internal Medicine

## 2016-03-04 DIAGNOSIS — R0902 Hypoxemia: Secondary | ICD-10-CM | POA: Diagnosis not present

## 2016-03-04 DIAGNOSIS — Z803 Family history of malignant neoplasm of breast: Secondary | ICD-10-CM

## 2016-03-04 DIAGNOSIS — I4891 Unspecified atrial fibrillation: Secondary | ICD-10-CM | POA: Diagnosis not present

## 2016-03-04 DIAGNOSIS — Z800 Family history of malignant neoplasm of digestive organs: Secondary | ICD-10-CM | POA: Diagnosis not present

## 2016-03-04 DIAGNOSIS — J9601 Acute respiratory failure with hypoxia: Secondary | ICD-10-CM | POA: Diagnosis not present

## 2016-03-04 DIAGNOSIS — Z86711 Personal history of pulmonary embolism: Secondary | ICD-10-CM | POA: Diagnosis not present

## 2016-03-04 DIAGNOSIS — I110 Hypertensive heart disease with heart failure: Secondary | ICD-10-CM | POA: Diagnosis not present

## 2016-03-04 DIAGNOSIS — J84112 Idiopathic pulmonary fibrosis: Secondary | ICD-10-CM | POA: Diagnosis present

## 2016-03-04 DIAGNOSIS — Z8249 Family history of ischemic heart disease and other diseases of the circulatory system: Secondary | ICD-10-CM | POA: Diagnosis not present

## 2016-03-04 DIAGNOSIS — I2699 Other pulmonary embolism without acute cor pulmonale: Secondary | ICD-10-CM

## 2016-03-04 DIAGNOSIS — R0600 Dyspnea, unspecified: Secondary | ICD-10-CM | POA: Diagnosis not present

## 2016-03-04 DIAGNOSIS — I2721 Secondary pulmonary arterial hypertension: Secondary | ICD-10-CM | POA: Diagnosis not present

## 2016-03-04 DIAGNOSIS — J810 Acute pulmonary edema: Secondary | ICD-10-CM

## 2016-03-04 DIAGNOSIS — I081 Rheumatic disorders of both mitral and tricuspid valves: Secondary | ICD-10-CM | POA: Diagnosis not present

## 2016-03-04 DIAGNOSIS — I499 Cardiac arrhythmia, unspecified: Secondary | ICD-10-CM | POA: Diagnosis not present

## 2016-03-04 DIAGNOSIS — H3530 Unspecified macular degeneration: Secondary | ICD-10-CM | POA: Diagnosis not present

## 2016-03-04 DIAGNOSIS — J189 Pneumonia, unspecified organism: Secondary | ICD-10-CM | POA: Diagnosis not present

## 2016-03-04 DIAGNOSIS — R0602 Shortness of breath: Secondary | ICD-10-CM | POA: Diagnosis not present

## 2016-03-04 DIAGNOSIS — I5031 Acute diastolic (congestive) heart failure: Secondary | ICD-10-CM | POA: Diagnosis not present

## 2016-03-04 DIAGNOSIS — I509 Heart failure, unspecified: Secondary | ICD-10-CM | POA: Diagnosis not present

## 2016-03-04 DIAGNOSIS — K219 Gastro-esophageal reflux disease without esophagitis: Secondary | ICD-10-CM | POA: Diagnosis present

## 2016-03-04 DIAGNOSIS — J101 Influenza due to other identified influenza virus with other respiratory manifestations: Secondary | ICD-10-CM | POA: Diagnosis not present

## 2016-03-04 DIAGNOSIS — I480 Paroxysmal atrial fibrillation: Secondary | ICD-10-CM | POA: Diagnosis not present

## 2016-03-04 DIAGNOSIS — R000 Tachycardia, unspecified: Secondary | ICD-10-CM | POA: Diagnosis not present

## 2016-03-04 DIAGNOSIS — J811 Chronic pulmonary edema: Secondary | ICD-10-CM | POA: Diagnosis not present

## 2016-03-04 LAB — BASIC METABOLIC PANEL
Anion gap: 10 (ref 5–15)
BUN: 14 mg/dL (ref 6–20)
CO2: 20 mmol/L — ABNORMAL LOW (ref 22–32)
CREATININE: 1.09 mg/dL — AB (ref 0.44–1.00)
Calcium: 8.6 mg/dL — ABNORMAL LOW (ref 8.9–10.3)
Chloride: 111 mmol/L (ref 101–111)
GFR calc non Af Amer: 46 mL/min — ABNORMAL LOW (ref >60)
GFR, EST AFRICAN AMERICAN: 53 mL/min — AB (ref >60)
Glucose, Bld: 159 mg/dL — ABNORMAL HIGH (ref 65–99)
Potassium: 5 mmol/L (ref 3.5–5.1)
Sodium: 141 mmol/L (ref 135–145)

## 2016-03-04 LAB — CBC
HCT: 40.6 % (ref 35.0–47.0)
HEMOGLOBIN: 13.5 g/dL (ref 12.0–16.0)
MCH: 30 pg (ref 26.0–34.0)
MCHC: 33.1 g/dL (ref 32.0–36.0)
MCV: 90.5 fL (ref 80.0–100.0)
Platelets: 221 10*3/uL (ref 150–440)
RBC: 4.49 MIL/uL (ref 3.80–5.20)
RDW: 15.3 % — ABNORMAL HIGH (ref 11.5–14.5)
WBC: 9.5 10*3/uL (ref 3.6–11.0)

## 2016-03-04 LAB — APTT: aPTT: 25 seconds (ref 24–36)

## 2016-03-04 LAB — LACTIC ACID, PLASMA
LACTIC ACID, VENOUS: 2.6 mmol/L — AB (ref 0.5–1.9)
Lactic Acid, Venous: 2.4 mmol/L (ref 0.5–1.9)

## 2016-03-04 LAB — PROTIME-INR
INR: 1.03
PROTHROMBIN TIME: 13.5 s (ref 11.4–15.2)

## 2016-03-04 LAB — PROCALCITONIN

## 2016-03-04 LAB — TSH
TSH: 1.511 u[IU]/mL (ref 0.350–4.500)
TSH: 0.947 u[IU]/mL (ref 0.350–4.500)

## 2016-03-04 LAB — INFLUENZA PANEL BY PCR (TYPE A & B)
Influenza A By PCR: POSITIVE — AB
Influenza B By PCR: NEGATIVE

## 2016-03-04 LAB — MAGNESIUM: MAGNESIUM: 2 mg/dL (ref 1.7–2.4)

## 2016-03-04 LAB — BRAIN NATRIURETIC PEPTIDE: B NATRIURETIC PEPTIDE 5: 483 pg/mL — AB (ref 0.0–100.0)

## 2016-03-04 LAB — TROPONIN I
Troponin I: <0.03 ng/mL (ref <0.03)
Troponin I: 0.04 ng/mL

## 2016-03-04 MED ORDER — DILTIAZEM HCL 25 MG/5ML IV SOLN
5.0000 mg | Freq: Four times a day (QID) | INTRAVENOUS | Status: DC | PRN
  Filled 2016-03-04: qty 5

## 2016-03-04 MED ORDER — HYDROCODONE-ACETAMINOPHEN 5-325 MG PO TABS: 1.0000 | ORAL_TABLET | ORAL | Status: DC | PRN

## 2016-03-04 MED ORDER — DILTIAZEM HCL 25 MG/5ML IV SOLN
10.0000 mg | Freq: Once | INTRAVENOUS | Status: AC
  Administered 2016-03-04: 10 mg via INTRAVENOUS
  Filled 2016-03-04: qty 5

## 2016-03-04 MED ORDER — DIAZEPAM 5 MG PO TABS: 5.0000 mg | ORAL_TABLET | Freq: Two times a day (BID) | ORAL | Status: DC | PRN

## 2016-03-04 MED ORDER — DILTIAZEM HCL 100 MG IV SOLR
5.0000 mg/h | INTRAVENOUS | Status: DC
  Administered 2016-03-04 – 2016-03-06 (×3): 5 mg/h via INTRAVENOUS
  Filled 2016-03-04 (×4): qty 100

## 2016-03-04 MED ORDER — SODIUM CHLORIDE 0.9% FLUSH
3.0000 mL | Freq: Two times a day (BID) | INTRAVENOUS | Status: DC
  Administered 2016-03-05: 3 mL via INTRAVENOUS

## 2016-03-04 MED ORDER — RIVAROXABAN 15 MG PO TABS
ORAL_TABLET | ORAL | Status: AC
  Administered 2016-03-04: 15 mg via ORAL
  Filled 2016-03-04: qty 1

## 2016-03-04 MED ORDER — ACETAMINOPHEN 650 MG RE SUPP
650.0000 mg | Freq: Four times a day (QID) | RECTAL | Status: DC | PRN
  Filled 2016-03-04: qty 1

## 2016-03-04 MED ORDER — RISAQUAD PO CAPS
1.0000 | ORAL_CAPSULE | Freq: Every day | ORAL | Status: DC
  Administered 2016-03-04 – 2016-03-06 (×3): 1 via ORAL
  Filled 2016-03-04 (×3): qty 1

## 2016-03-04 MED ORDER — IOPAMIDOL (ISOVUE-370) INJECTION 76%
75.0000 mL | Freq: Once | INTRAVENOUS | Status: AC | PRN
  Administered 2016-03-04: 75 mL via INTRAVENOUS

## 2016-03-04 MED ORDER — RIVAROXABAN 15 MG PO TABS
15.0000 mg | ORAL_TABLET | Freq: Two times a day (BID) | ORAL | Status: DC
  Administered 2016-03-04 – 2016-03-06 (×5): 15 mg via ORAL
  Filled 2016-03-04 (×4): qty 1

## 2016-03-04 MED ORDER — FUROSEMIDE 10 MG/ML IJ SOLN
20.0000 mg | Freq: Once | INTRAMUSCULAR | Status: AC
  Administered 2016-03-04: 20 mg via INTRAVENOUS
  Filled 2016-03-04: qty 4

## 2016-03-04 MED ORDER — SODIUM CHLORIDE 0.9 % IV SOLN: 250.0000 mL | INTRAVENOUS | Status: DC | PRN

## 2016-03-04 MED ORDER — RIVAROXABAN 20 MG PO TABS: 20.0000 mg | ORAL_TABLET | Freq: Every day | ORAL | Status: DC

## 2016-03-04 MED ORDER — PANTOPRAZOLE SODIUM 40 MG PO TBEC
40.0000 mg | DELAYED_RELEASE_TABLET | Freq: Every day | ORAL | Status: DC
  Administered 2016-03-04 – 2016-03-06 (×3): 40 mg via ORAL
  Filled 2016-03-04 (×2): qty 1

## 2016-03-04 MED ORDER — LEVOFLOXACIN IN D5W 500 MG/100ML IV SOLN
INTRAVENOUS | Status: AC
  Administered 2016-03-04: 500 mg via INTRAVENOUS
  Filled 2016-03-04: qty 100

## 2016-03-04 MED ORDER — IPRATROPIUM BROMIDE 0.02 % IN SOLN
0.5000 mg | Freq: Four times a day (QID) | RESPIRATORY_TRACT | Status: DC | PRN
  Filled 2016-03-04: qty 2.5

## 2016-03-04 MED ORDER — HEPARIN (PORCINE) IN NACL 100-0.45 UNIT/ML-% IJ SOLN
1050.0000 [IU]/h | INTRAMUSCULAR | Status: DC
  Administered 2016-03-04: 1050 [IU]/h via INTRAVENOUS
  Filled 2016-03-04: qty 250

## 2016-03-04 MED ORDER — GUAIFENESIN ER 600 MG PO TB12
600.0000 mg | ORAL_TABLET | Freq: Two times a day (BID) | ORAL | Status: DC
  Administered 2016-03-04 – 2016-03-06 (×5): 600 mg via ORAL
  Filled 2016-03-04 (×7): qty 1

## 2016-03-04 MED ORDER — SODIUM CHLORIDE 0.9% FLUSH
3.0000 mL | Freq: Two times a day (BID) | INTRAVENOUS | Status: DC
  Administered 2016-03-04 – 2016-03-06 (×2): 3 mL via INTRAVENOUS

## 2016-03-04 MED ORDER — DILTIAZEM HCL 60 MG PO TABS: 60.0000 mg | ORAL_TABLET | Freq: Three times a day (TID) | ORAL | Status: DC

## 2016-03-04 MED ORDER — SODIUM CHLORIDE 0.9% FLUSH
3.0000 mL | INTRAVENOUS | Status: DC | PRN
  Administered 2016-03-04: 3 mL via INTRAVENOUS
  Filled 2016-03-04: qty 3

## 2016-03-04 MED ORDER — ALBUTEROL SULFATE (2.5 MG/3ML) 0.083% IN NEBU
2.5000 mg | INHALATION_SOLUTION | Freq: Four times a day (QID) | RESPIRATORY_TRACT | Status: DC | PRN
  Filled 2016-03-04: qty 3

## 2016-03-04 MED ORDER — ONDANSETRON HCL 4 MG PO TABS
4.0000 mg | ORAL_TABLET | Freq: Four times a day (QID) | ORAL | Status: DC | PRN
  Filled 2016-03-04: qty 1

## 2016-03-04 MED ORDER — ACETAMINOPHEN 325 MG PO TABS
650.0000 mg | ORAL_TABLET | Freq: Four times a day (QID) | ORAL | Status: DC | PRN
  Administered 2016-03-04 – 2016-03-05 (×2): 650 mg via ORAL
  Filled 2016-03-04 (×2): qty 2

## 2016-03-04 MED ORDER — ONDANSETRON HCL 4 MG/2ML IJ SOLN: 4.0000 mg | Freq: Four times a day (QID) | INTRAMUSCULAR | Status: DC | PRN

## 2016-03-04 MED ORDER — LEVOFLOXACIN IN D5W 500 MG/100ML IV SOLN
500.0000 mg | INTRAVENOUS | Status: DC
  Administered 2016-03-04: 500 mg via INTRAVENOUS
  Filled 2016-03-04 (×2): qty 100

## 2016-03-04 MED ORDER — HEPARIN BOLUS VIA INFUSION
4000.0000 [IU] | Freq: Once | INTRAVENOUS | Status: AC
  Administered 2016-03-04: 4000 [IU] via INTRAVENOUS
  Filled 2016-03-04: qty 4000

## 2016-03-04 MED ORDER — PANTOPRAZOLE SODIUM 40 MG PO TBEC
DELAYED_RELEASE_TABLET | ORAL | Status: AC
  Administered 2016-03-04: 40 mg via ORAL
  Filled 2016-03-04: qty 1

## 2016-03-04 MED ORDER — DILTIAZEM HCL 30 MG PO TABS: 30.0000 mg | ORAL_TABLET | Freq: Once | ORAL | Status: DC

## 2016-03-04 NOTE — Progress Notes (Signed)
ANTICOAGULATION CONSULT NOTE - Initial Consult  Pharmacy Consult for Heparin drip Indication: pulmonary embolus  No Known Allergies  Patient Measurements: Weight: 134 lb (60.8 kg) Heparin Dosing Weight: 60.8 kg  Vital Signs: Temp: 99.1 F (37.3 C) (01/19 0800) Temp Source: Axillary (01/19 0800) BP: 108/62 (01/19 1045) Pulse Rate: 132 (01/19 1045)  Labs:  Recent Labs  03/04/16 0816 03/04/16 0850 03/04/16 0907  HGB  --   --  13.5  HCT  --   --  40.6  PLT  --   --  221  APTT  --  25  --   LABPROT  --  13.5  --   INR  --  1.03  --   CREATININE 1.09*  --   --   TROPONINI <0.03  --   --     Estimated Creatinine Clearance: 32.3 mL/min (by C-G formula based on SCr of 1.09 mg/dL (H)).   Medical History: Past Medical History:  Diagnosis Date  . Atrial fibrillation ([place])   . GERD (gastroesophageal reflux disease)   . IPF (idiopathic pulmonary fibrosis) ([place]) 20111   by CT, PFTS done by Humphrey Rolls  . Mitral regurgitation   . Pulmonary embolism ([facility])     Medications:  Scheduled:  . heparin  4,000 Units Intravenous Once   Infusions:  . heparin      Assessment: Pharmacy consulted to dose Heparin for VTE treatment (PE) in this 83 yo female.    Goal of Therapy:  Heparin level 0.3-0.7 units/ml Monitor platelets by anticoagulation protocol: Yes   Plan:  Give 4000 units bolus x 1 Start heparin infusion at 1050 units/hr Check anti-Xa level in 8 hours and daily while on heparin Continue to monitor H&H and platelets  Olivia Canter, [place] Clinical Pharmacist 03/04/2016,10:59 AM

## 2016-03-04 NOTE — H&P (Signed)
[facility] NAME: Frances Kent    MR#:  [id number]  DATE OF BIRTH:  1930-06-10  DATE OF ADMISSION:  03/04/2016  PRIMARY CARE PHYSICIAN: [name], MD   REQUESTING/REFERRING PHYSICIAN: Delman Kitten MD  CHIEF COMPLAINT:   Chief Complaint  Patient presents with  . Shortness of Breath  . Tachycardia    HISTORY OF PRESENT ILLNESS: Frances Kent  is a 83 y.o. female with a known history of Pulmonary embolism in 2016 was treated for 6 months and then blood that was stopped. She also has a history of mitral valve regurg as well as stenosis as well as tricuspid valve dysfunction who is presenting to the ED with complaint of shortness of breath ongoing for the past 1 week has progressively gotten worse. Patient also has had a dry cough for the past 2 days. She complains of aches in both of her lower extremities. Denies any fevers or chills. Denies any chest pain or palpitations. In the emergency room is noted to have a pulmonary embolism which is small as well as congestive heart failure as well as possible pneumonia.  PAST MEDICAL HISTORY:   Past Medical History:  Diagnosis Date  . Atrial fibrillation ([place])   . GERD (gastroesophageal reflux disease)   . IPF (idiopathic pulmonary fibrosis) ([place]) [address]11   by CT, PFTS done by Humphrey Rolls  . Mitral regurgitation   . Pulmonary embolism ([place])     PAST SURGICAL HISTORY: Past Surgical History:  Procedure Laterality Date  . APPENDECTOMY  1960  . CHOLECYSTECTOMY  1985  . OVARIAN CYST REMOVAL      SOCIAL HISTORY:  Social History  Substance Use Topics  . Smoking status: Never Smoker  . Smokeless tobacco: Never Used     Comment: 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 , worked at [employer], [employer]  . Alcohol use No    FAMILY HISTORY:  Family History  Problem Relation Age of Onset  . Cancer Mother 21    breast cancer, lived to 30,   . Cancer Son 73    pancreatic cancer  . Heart disease Son     CAD,  Tobacco Abuse   . Heart disease Maternal Grandmother 80    died of massive MI    DRUG ALLERGIES: No Known Allergies  REVIEW OF SYSTEMS:   CONSTITUTIONAL: No fever,Positive fatigue and weakness.  EYES: No blurred or double vision.  EARS, NOSE, AND THROAT: No tinnitus or ear pain.  RESPIRATORY: Positive cough, positive shortness of breath, no wheezing or hemoptysis.  CARDIOVASCULAR: No chest pain, orthopnea, edema.  GASTROINTESTINAL: No nausea, vomiting, diarrhea or abdominal pain.  GENITOURINARY: No dysuria, hematuria.  ENDOCRINE: No polyuria, nocturia,  HEMATOLOGY: No anemia, easy bruising or bleeding SKIN: No rash or lesion. MUSCULOSKELETAL: No joint pain or arthritis.   NEUROLOGIC: No tingling, numbness, weakness.  PSYCHIATRY: No anxiety or depression.   MEDICATIONS AT HOME:  Prior to Admission medications   Medication Sig Start Date End Date Taking? Authorizing Provider  acidophilus (RISAQUAD) CAPS capsule Take 1 capsule by mouth daily. Reported on 04/20/2015   Yes Historical Provider, MD  Cholecalciferol (VITAMIN D3) 1000 units CAPS Take by mouth.   Yes Historical Provider, MD  diazepam (VALIUM) 5 MG tablet Take 1 tablet (5 mg total) by mouth every 12 (twelve) hours as needed for anxiety or muscle spasms. 12/11/14  Yes [name], MD  pantoprazole (PROTONIX) 40 MG tablet Take 40 mg by mouth daily as needed.  Yes Historical Provider, MD  bevacizumab (AVASTIN) 1.25 mg/0.1 mL SOLN by Intravitreal route every 6 (six) weeks. For macular degeneration    Historical Provider, MD      PHYSICAL EXAMINATION:   VITAL SIGNS: Blood pressure 108/73, pulse 76, temperature 99.1 F (37.3 C), temperature source Axillary, resp. rate (!) 25, weight 134 lb (60.8 kg), SpO2 (!) 83 %.  GENERAL:  83 y.o.-year-old patient lying in the bed with no acute distress.  EYES: Pupils equal, round, reactive to light and accommodation. No scleral icterus. Extraocular muscles intact.  HEENT: Head  atraumatic, normocephalic. Oropharynx and nasopharynx clear.  NECK:  Supple, no jugular venous distention. No thyroid enlargement, no tenderness.  LUNGS: Rhonchorous breath sounds no wheezing, rales, or crepitation.Mild use of accessory muscles of respiration.  CARDIOVASCULAR: Irregularly regular positive systolic murmur at the apex, rubs, or gallops.  ABDOMEN: Soft, nontender, nondistended. Bowel sounds present. No organomegaly or mass.  EXTREMITIES: 1+ pedal edema, cyanosis, or clubbing.  NEUROLOGIC: Cranial nerves II through XII are intact. Muscle strength 5/5 in all extremities. Sensation intact. Gait not checked.  PSYCHIATRIC: The patient is alert and oriented x 3.  SKIN: No obvious rash, lesion, or ulcer.   LABORATORY PANEL:   CBC  Recent Labs Lab 03/04/16 0907  WBC 9.5  HGB 13.5  HCT 40.6  PLT 221  MCV 90.5  MCH 30.0  MCHC 33.1  RDW 15.3*   ------------------------------------------------------------------------------------------------------------------  Chemistries   Recent Labs Lab 03/04/16 0816  NA 141  K 5.0  CL 111  CO2 20*  GLUCOSE 159*  BUN 14  CREATININE 1.09*  CALCIUM 8.6*  MG 2.0   ------------------------------------------------------------------------------------------------------------------ estimated creatinine clearance is 32.3 mL/min (by C-G formula based on SCr of 1.09 mg/dL (H)). ------------------------------------------------------------------------------------------------------------------  Recent Labs  03/04/16 0816  TSH 1.511     Coagulation profile  Recent Labs Lab 03/04/16 0850  INR 1.03   ------------------------------------------------------------------------------------------------------------------- No results for input(s): DDIMER in the last 72 hours. -------------------------------------------------------------------------------------------------------------------  Cardiac Enzymes  Recent Labs Lab 03/04/16 0816   TROPONINI <0.03   ------------------------------------------------------------------------------------------------------------------ Invalid input(s): POCBNP  ---------------------------------------------------------------------------------------------------------------  Urinalysis    Component Value Date/Time   COLORURINE YELLOW (A) 12/04/2014 1000   APPEARANCEUR CLEAR (A) 12/04/2014 1000   APPEARANCEUR Clear 02/05/2013 1630   LABSPEC 1.009 12/04/2014 1000   LABSPEC 1.009 02/05/2013 1630   PHURINE 7.0 12/04/2014 1000   GLUCOSEU NEGATIVE 12/04/2014 1000   GLUCOSEU Negative 02/05/2013 1630   HGBUR NEGATIVE 12/04/2014 1000   BILIRUBINUR NEGATIVE 12/04/2014 1000   BILIRUBINUR Negative 02/05/2013 1630   KETONESUR NEGATIVE 12/04/2014 1000   PROTEINUR NEGATIVE 12/04/2014 1000   UROBILINOGEN 0.2 02/05/2013 1338   UROBILINOGEN 0.2 10/12/2011 1152   NITRITE NEGATIVE 12/04/2014 1000   LEUKOCYTESUR NEGATIVE 12/04/2014 1000   LEUKOCYTESUR 1+ 02/05/2013 1630     RADIOLOGY: Ct Angio Chest Pe W Or Wo Contrast  Result Date: 03/04/2016 CLINICAL DATA:  83 year old female with shortness of breath since yesterday evening. SVT. EXAM: CT ANGIOGRAPHY CHEST WITH CONTRAST TECHNIQUE: Multidetector CT imaging of the chest was performed using the standard protocol during bolus administration of intravenous contrast. Multiplanar CT image reconstructions and MIPs were obtained to evaluate the vascular anatomy. CONTRAST:  75 mL of Isovue 370. COMPARISON:  Chest CT 12/22/2015. FINDINGS: Cardiovascular: Subsegmental sized nonocclusive filling defect noted in the right lower lobe (image 166 of series 5). No other larger central or segmental sized filling defects are noted. Heart size is mildly enlarged with left atrial dilatation. There is no significant pericardial  fluid, thickening or pericardial calcification. There is aortic atherosclerosis, as well as atherosclerosis of the great vessels of the mediastinum  and the coronary arteries, including calcified atherosclerotic plaque in the right coronary artery. Calcifications of the mitral annulus. Mediastinum/Nodes: Several borderline enlarged and minimally enlarged mediastinal and bilateral hilar lymph nodes are noted measuring up to 12 mm in short axis in the low right paratracheal nodal station. Esophagus is unremarkable in appearance. No axillary lymphadenopathy. Lungs/Pleura: There is mild diffuse ground-glass attenuation and interlobular septal thickening, suggesting a background of mild interstitial pulmonary edema. Additionally, in the lower lobes of the lungs bilaterally there is more profound thickening of the peribronchovascular interstitium and more severe peribronchovascular ground-glass attenuation, micro nodularity and frank airspace consolidation, likely sequela of recent aspiration. Small bilateral pleural effusions layering dependently. Upper Abdomen: Status post cholecystectomy.  Aortic atherosclerosis. Musculoskeletal: There are no aggressive appearing lytic or blastic lesions noted in the visualized portions of the skeleton. Review of the MIP images confirms the above findings. IMPRESSION: 1. Small nonocclusive subsegmental sized pulmonary embolus in the right lower lobe. This is of uncertain clinical significance. No larger more central or segmental sized pulmonary embolism is noted. 2. The appearance of the lower lobes of the lungs is concerning for aspiration pneumonitis/pneumonia. 3. In addition, there is evidence of congestive heart failure with mild interstitial pulmonary edema and small bilateral pleural effusions. 4. Cardiomegaly with left atrial dilatation. 5. Aortic atherosclerosis, in addition to left circumflex coronary artery disease. 6. Additional incidental findings, as above. Critical Value/emergent results were called by telephone at the time of interpretation on 03/04/2016 at 10:42 am to Nurse Marya Amsler for Dr. Delman Kitten, who verbally  acknowledged these results. Electronically Signed   By: Vinnie Langton M.D.   On: 03/04/2016 10:47   Dg Chest Portable 1 View  Result Date: 03/04/2016 CLINICAL DATA:  Tachycardia, hypoxia, dyspnea. History of atrial fibrillation. EXAM: PORTABLE CHEST 1 VIEW COMPARISON:  Multiple exams, including 12/22/2015 CT scan FINDINGS: Mild enlargement of the cardiopericardial silhouette with cephalization of blood flow and faint interstitial accentuation. Atherosclerotic aortic arch. Prominence of the central pulmonary vasculature. New 9 mm apical left lung lesion, appears to be different from the prior pleuroparenchymal scarring in this region, and not readily seen on prior chest radiographs. Mild left lower lobe airspace opacity with indistinctness of the left posterior hemidiaphragm. IMPRESSION: 1. Possible 9 mm left apical lung nodule common new compared to prior exams. Chest CT or close follow up chest radiography recommended. 2. Mild enlargement of the cardiopericardial silhouette with cephalization of blood flow and mild interstitial accentuation, suspicious for mild interstitial edema. 3.  Atherosclerotic calcification of the aortic arch. 4. Linear opacities in the retrocardiac region with indistinctness of the left hemidiaphragm, possibly from confluent edema or bronchopneumonia. 5. Prominent central pulmonary vasculature likely from pulmonary arterial hypertension. Electronically Signed   By: Van Clines M.D.   On: 03/04/2016 08:33    EKG: Orders placed or performed during the hospital encounter of 03/04/16  . EKG 12-Lead  . EKG 12-Lead    IMPRESSION AND PLAN: Patient is a 83 year old presenting with shortness of breath noted to be hypoxic and tachycardic  1. Acute hypoxic respiratory failure Multifactorial Acute CHF will treat with IV Lasix suspect related to her mitral valve disease, echocardiogram in November 2017 at [facility] clinic I will not repeat Acute pulmonary embolism patient was  previously on eliquis states there were severe neck pain, I will start patient on Xarelto stop heparin that was started  in the ER, check venous Doppler lower extremity Cough with pneumonia- WBC count is normal, I will check pro calcitonin level,  We will treat with Levaquin for now   2. Atrial fibrillation with rapid ventricular rate I will start her on oral Cardizem Check a TSH I will ask her cardiologist to see Patient will be on Xarelto cardiology needs to assist with anticoagulation in light of her valvular disease to see if this is appropriate or needs to be on Coumadin  3. GERD We will treat with Protonix  4.  CODE STATUS discussed with the patient she would like to be a full code   All the records are reviewed and case discussed with ED provider. Management plans discussed with the patient, family and they are in agreement.  CODE STATUS: Code Status History    Date Active Date Inactive Code Status Order ID Comments User Context   12/04/2014 12:15 PM 12/05/2014  4:04 PM Full Code [id number]  Vaughan Basta, MD Inpatient   12/02/2014  2:56 PM 12/03/2014  6:03 PM Full Code [id number]  Aldean Jewett, MD ED       TOTAL TIME TAKING CARE OF THIS PATIENT:55 minutes.    Dustin Flock M.D on 03/04/2016 at 11:24 AM  Between 7am to 6pm - Pager - [phone]  After 6pm go to [email or web address] - password [email or web address] [place] [facility]  Office  [phone]  CC: Primary care physician; [name], MD

## 2016-03-04 NOTE — ED Triage Notes (Signed)
Pt from home via ems with reports of sob that began last night, when ems arrived pt was in SVT rate of 197. Pts sats were 87% on RA. Pt was placed on NRB 15L and sats were 92%. Pt was also given 6mg  Adenosine and 12mg  of adenosine without relief. Pt was also given by EMS total of 20mg  Cardizem.

## 2016-03-04 NOTE — ED Provider Notes (Signed)
[facility] Emergency Department Provider Note   ____________________________________________   First MD Initiated Contact with Patient 03/04/16 [phone]     (approximate)  I have reviewed the triage vital signs and the nursing notes.   HISTORY  Chief Complaint Shortness of Breath and Tachycardia  EM caveat: Limited due to dyspnea and respiratory distress  HPI Frances Kent is a 83 y.o. female reports yesterday began experiencing a rather abrupt onset of shortness of breath. Became extremely worse this morning week her to call for rescue squad.  Patient denies pain. She has had a slight nonproductive cough over the last day that has seemed worse. Her daughter notes she has a history of a previous blood clot and was on a blood thinner until about 6 months ago.  She also has a history of atrial fibrillation and mitral valve regurgitation Patient reports that she feels short of breath, even while wearing a nonrebreather she feels like she is having trouble getting air into her lungs. She denies wheezing.  Patient does not have any chest pain  Denies swelling in her legs  EMS noted very fast rate, given adenosine twice with the second dose revealing probable underlying A. fib. Patient was then treated with a total of 20 mg of diltiazem over 2 doses with improvement in rate to about 110 chest after arrival Past Medical History:  Diagnosis Date  . Atrial fibrillation ([place])   . GERD (gastroesophageal reflux disease)   . IPF (idiopathic pulmonary fibrosis) ([place]) 20111   by CT, PFTS done by Humphrey Rolls  . Mitral regurgitation   . Pulmonary embolism [facility])     Patient Active Problem List   Diagnosis Date Noted  . Right ankle swelling 12/21/2015  . Vitamin D deficiency 04/08/2015  . Cervical spine degeneration 12/13/2014  . Acute pulmonary embolism ([place]) 12/02/2014  . Insomnia 10/04/2014  . PAF (paroxysmal atrial fibrillation) ([place]) 04/03/2014  . Skin lesion of  face 12/21/2013  . Achilles tendinosis 12/21/2013  . Generalized anxiety disorder 03/28/2013  . Mitral valve prolapse 03/28/2013  . Encounter for preventive health examination 03/23/2012  . IPF (idiopathic pulmonary fibrosis) ([place])   . Screening for breast cancer 11/29/2010  . Macular degeneration, left eye 11/29/2010  . Screening for colon cancer 11/29/2010  . Hyperlipidemia LDL goal <160 11/29/2010  . GERD (gastroesophageal reflux disease)     Past Surgical History:  Procedure Laterality Date  . APPENDECTOMY  1960  . CHOLECYSTECTOMY  1985  . OVARIAN CYST REMOVAL      Prior to Admission medications   Medication Sig Start Date End Date Taking? Authorizing Provider  acidophilus (RISAQUAD) CAPS capsule Take 1 capsule by mouth daily. Reported on 04/20/2015   Yes Historical Provider, MD  Cholecalciferol (VITAMIN D3) 1000 units CAPS Take by mouth.   Yes Historical Provider, MD  diazepam (VALIUM) 5 MG tablet Take 1 tablet (5 mg total) by mouth every 12 (twelve) hours as needed for anxiety or muscle spasms. 12/11/14  Yes [name], MD  pantoprazole (PROTONIX) 40 MG tablet Take 40 mg by mouth daily as needed.    Yes Historical Provider, MD  bevacizumab (AVASTIN) 1.25 mg/0.1 mL SOLN by Intravitreal route every 6 (six) weeks. For macular degeneration    Historical Provider, MD    Allergies Patient has no known allergies.  Family History  Problem Relation Age of Onset  . Cancer Mother 53    breast cancer, lived to 39,   . Cancer Son 69  pancreatic cancer  . Heart disease Son     CAD, Tobacco Abuse   . Heart disease Maternal Grandmother 80    died of massive MI    Social History Social History  Substance Use Topics  . Smoking status: Never Smoker  . Smokeless tobacco: Never Used     Comment: 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 , worked at [employer], [employer]  . Alcohol use No    Review of Systems Constitutional: No fever/chills Cardiovascular: Denies chest pain. Respiratory: Denies  shortness of breath. Gastrointestinal: No abdominal pain.  No nausea, no vomiting.  No diarrhea.  No constipation. Neurological: Negative for headaches, focal weakness or numbness.   ____________________________________________   PHYSICAL EXAM:  VITAL SIGNS: ED Triage Vitals  Enc Vitals Group     BP 03/04/16 0800 (!) 104/53     Pulse Rate 03/04/16 0800 (!) 192     Resp 03/04/16 0800 (!) 28     Temp 03/04/16 0800 99.1 F (37.3 C)     Temp Source 03/04/16 0800 Axillary     SpO2 03/04/16 0800 93 %     Weight 03/04/16 0805 134 lb (60.8 kg)     Height --      Head Circumference --      Peak Flow --      Pain Score --      Pain Loc --      Pain Edu? --      Excl. in [place]? --     Constitutional: Alert and oriented. Fatigued appearing, mildly ill-appearing, mildly dyspneic wearing a nonrebreather Eyes: Conjunctivae are normal. PERRL. EOMI. Head: Atraumatic. Nose: No congestion/rhinnorhea. Mouth/Throat: Mucous membranes are moist.  Oropharynx non-erythematous. Neck: No stridor.   Cardiovascular: Extreme tachycardic rate, regular rhythm. Grossly normal heart sounds.  Good peripheral circulation. Respiratory: Normal respiratory effort.  No retractions. Lungs CTAB except for some scant end expiratory wheezing versus a slight rub with Rales noted over the left lower lobe. Gastrointestinal: Soft and nontender. No distention.  Musculoskeletal: No lower extremity tenderness nor edema.  No joint effusions. Neurologic:  Normal speech and language. No gross focal neurologic deficits are appreciated.  Skin:  Skin is warm, dry and intact. No rash noted. Psychiatric: Mood and affect are normal. Speech and behavior are normal.  ____________________________________________   LABS (all labs ordered are listed, but only abnormal results are displayed)  Labs Reviewed  LACTIC ACID, PLASMA - Abnormal; Notable for the following:       Result Value   Lactic Acid, Venous 2.6 (*)    All other  components within normal limits  BLOOD GAS, VENOUS - Abnormal; Notable for the following:    pCO2, Ven 43 (*)    pO2, Ven <31.0 (*)    Acid-base deficit 2.6 (*)    All other components within normal limits  BASIC METABOLIC PANEL - Abnormal; Notable for the following:    CO2 20 (*)    Glucose, Bld 159 (*)    Creatinine, Ser 1.09 (*)    Calcium 8.6 (*)    GFR calc non Af Amer 46 (*)    GFR calc Af Amer 53 (*)    All other components within normal limits  CBC - Abnormal; Notable for the following:    RDW 15.3 (*)    All other components within normal limits  BRAIN NATRIURETIC PEPTIDE - Abnormal; Notable for the following:    B Natriuretic Peptide 483.0 (*)    All other components within normal limits  MAGNESIUM  TROPONIN I  TSH  APTT  PROTIME-INR  LACTIC ACID, PLASMA  HEPARIN LEVEL (UNFRACTIONATED)  PROCALCITONIN   ____________________________________________  EKG  Reviewed and interpreted by me at 8:05 AM Heart rate 110 Triage 110 QTC 5:15 Atrial fibrillation Incomplete right bundle branch block, nonspecific T-wave abnormalities noted including mild depressions in multiple leads Mild prolongation of the QT ____________________________________________  RADIOLOGY  Ct Angio Chest Pe W Or Wo Contrast  Result Date: 03/04/2016 CLINICAL DATA:  83 year old female with shortness of breath since yesterday evening. SVT. EXAM: CT ANGIOGRAPHY CHEST WITH CONTRAST TECHNIQUE: Multidetector CT imaging of the chest was performed using the standard protocol during bolus administration of intravenous contrast. Multiplanar CT image reconstructions and MIPs were obtained to evaluate the vascular anatomy. CONTRAST:  75 mL of Isovue 370. COMPARISON:  Chest CT 12/22/2015. FINDINGS: Cardiovascular: Subsegmental sized nonocclusive filling defect noted in the right lower lobe (image 166 of series 5). No other larger central or segmental sized filling defects are noted. Heart size is mildly enlarged  with left atrial dilatation. There is no significant pericardial fluid, thickening or pericardial calcification. There is aortic atherosclerosis, as well as atherosclerosis of the great vessels of the mediastinum and the coronary arteries, including calcified atherosclerotic plaque in the right coronary artery. Calcifications of the mitral annulus. Mediastinum/Nodes: Several borderline enlarged and minimally enlarged mediastinal and bilateral hilar lymph nodes are noted measuring up to 12 mm in short axis in the low right paratracheal nodal station. Esophagus is unremarkable in appearance. No axillary lymphadenopathy. Lungs/Pleura: There is mild diffuse ground-glass attenuation and interlobular septal thickening, suggesting a background of mild interstitial pulmonary edema. Additionally, in the lower lobes of the lungs bilaterally there is more profound thickening of the peribronchovascular interstitium and more severe peribronchovascular ground-glass attenuation, micro nodularity and frank airspace consolidation, likely sequela of recent aspiration. Small bilateral pleural effusions layering dependently. Upper Abdomen: Status post cholecystectomy.  Aortic atherosclerosis. Musculoskeletal: There are no aggressive appearing lytic or blastic lesions noted in the visualized portions of the skeleton. Review of the MIP images confirms the above findings. IMPRESSION: 1. Small nonocclusive subsegmental sized pulmonary embolus in the right lower lobe. This is of uncertain clinical significance. No larger more central or segmental sized pulmonary embolism is noted. 2. The appearance of the lower lobes of the lungs is concerning for aspiration pneumonitis/pneumonia. 3. In addition, there is evidence of congestive heart failure with mild interstitial pulmonary edema and small bilateral pleural effusions. 4. Cardiomegaly with left atrial dilatation. 5. Aortic atherosclerosis, in addition to left circumflex coronary artery  disease. 6. Additional incidental findings, as above. Critical Value/emergent results were called by telephone at the time of interpretation on 03/04/2016 at 10:42 am to Nurse Marya Amsler for Dr. Delman Kitten, who verbally acknowledged these results. Electronically Signed   By: Vinnie Langton M.D.   On: 03/04/2016 10:47   Dg Chest Portable 1 View  Result Date: 03/04/2016 CLINICAL DATA:  Tachycardia, hypoxia, dyspnea. History of atrial fibrillation. EXAM: PORTABLE CHEST 1 VIEW COMPARISON:  Multiple exams, including 12/22/2015 CT scan FINDINGS: Mild enlargement of the cardiopericardial silhouette with cephalization of blood flow and faint interstitial accentuation. Atherosclerotic aortic arch. Prominence of the central pulmonary vasculature. New 9 mm apical left lung lesion, appears to be different from the prior pleuroparenchymal scarring in this region, and not readily seen on prior chest radiographs. Mild left lower lobe airspace opacity with indistinctness of the left posterior hemidiaphragm. IMPRESSION: 1. Possible 9 mm left apical lung nodule common new compared to prior  exams. Chest CT or close follow up chest radiography recommended. 2. Mild enlargement of the cardiopericardial silhouette with cephalization of blood flow and mild interstitial accentuation, suspicious for mild interstitial edema. 3.  Atherosclerotic calcification of the aortic arch. 4. Linear opacities in the retrocardiac region with indistinctness of the left hemidiaphragm, possibly from confluent edema or bronchopneumonia. 5. Prominent central pulmonary vasculature likely from pulmonary arterial hypertension. Electronically Signed   By: Van Clines M.D.   On: 03/04/2016 08:33    ____________________________________________   PROCEDURES  Procedure(s) performed: None  Procedures  Critical Care performed: Yes, see critical care note(s)  CRITICAL CARE Performed by: Delman Kitten   Total critical care time: 45  minutes  Critical care time was exclusive of separately billable procedures and treating other patients.  Critical care was necessary to treat or prevent imminent or life-threatening deterioration.  Critical care was time spent personally by me on the following activities: development of treatment plan with patient and/or surrogate as well as nursing, discussions with consultants, evaluation of patient's response to treatment, examination of patient, obtaining history from patient or surrogate, ordering and performing treatments and interventions, ordering and review of laboratory studies, ordering and review of radiographic studies, pulse oximetry and re-evaluation of patient's condition.  Patient presents on nonrebreather, maintaining sats in the low 90s. Sudden rather severe worsening hypoxia over the last 24 hours/dyspnea, associated with a heart rate approaching 200. ____________________________________________   INITIAL IMPRESSION / ASSESSMENT AND PLAN / ED COURSE  Pertinent labs & imaging results that were available during my care of the patient were reviewed by me and considered in my medical decision making (see chart for details).  Patient presents with hypoxia sudden and worsening shortness of breath. Found to be in A. fib with RVR, patient reports improvement in symptoms after rate control however still notes feeling short of breath. She has a notable history of prior pulmonary embolus, pulmonary fibrosis, as well as mitral valve regurgitation. I'm concerned about a broad potential for different etiology and plan to proceed with a CT of the chest note the patient is stabilizing, weight on labs chest x-ray etc. and to monitor the patient carefully before making a acute treatment decision. Her respiratory status has improved after receiving rate control, and she is resting much more comfortably with much less distress at 8:30 AM    ----------------------------------------- 11:26 AM on  03/04/2016 -----------------------------------------  Patient reports clinical improvement. Currently satting in the low 90s, resting with improved oxygenation on nasal cannula. Patient and family agree with plan for admission. I am uncertain that the patient could have clinical pneumonia, her presentation today to me seems more consistent with pulmonary edema/CHF and possible pulmonary embolism. However, discussed this with Dr. Posey Pronto of the hospitalist service. The patient does not presently have fever, leukocytosis, or productive cough or obvious infectious symptomatology. We'll defer to the hospitalist service for decision on further workup/anabiotic's.   ____________________________________________   FINAL CLINICAL IMPRESSION(S) / ED DIAGNOSES  Final diagnoses:  Hypoxia  Atrial fibrillation with RVR ([place])  Pulmonary edema, acute ([place])  Other pulmonary embolism without acute cor pulmonale, unspecified chronicity ([place])      NEW MEDICATIONS STARTED DURING THIS VISIT:  New Prescriptions   No medications on file     Note:  This document was prepared using Dragon voice recognition software and may include unintentional dictation errors.     Delman Kitten, MD 03/04/16 [phone]

## 2016-03-04 NOTE — ED Notes (Signed)
Pt HR between 120-140's. Dr. Posey Pronto paged and informed. States that he will order cardizem drip.

## 2016-03-04 NOTE — ED Notes (Addendum)
Reported to Dr. Posey Pronto pt critical troponin of 0.04 and received verbal orders for tylenol for patient oral temp 100.36F. Discussed with Dr. Posey Pronto about patient HR remaining 105-125's since cardizem drip started and unable to titrate up due to patient BP dropping when titrating up. States that he will place additional orders. No further verbal orders at this time.

## 2016-03-04 NOTE — ED Notes (Signed)
Paged admitting MD to report critical.

## 2016-03-04 NOTE — ED Notes (Signed)
Pt was taken off NRB and placed on 3L [place]

## 2016-03-04 NOTE — ED Notes (Signed)
Per verbal order, Heparin Infusion stopped. Pt will be placed on xarelto.

## 2016-03-04 NOTE — ED Notes (Signed)
Pt assisted to bedside commode

## 2016-03-04 NOTE — ED Notes (Signed)
Pt taken to CT.

## 2016-03-04 NOTE — ED Notes (Signed)
Pt placed on droplet precautions

## 2016-03-04 NOTE — Progress Notes (Signed)
ANTICOAGULATION CONSULT NOTE - Initial Consult  Pharmacy Consult for Xarelto  Indication: pulmonary embolus  No Known Allergies  Patient Measurements: Weight: 134 lb (60.8 kg)  Vital Signs: Temp: 99.1 F (37.3 C) (01/19 0800) Temp Source: Axillary (01/19 0800) BP: 105/65 (01/19 1130) Pulse Rate: 77 (01/19 1130)  Labs:  Recent Labs  03/04/16 0816 03/04/16 0850 03/04/16 0907  HGB  --   --  13.5  HCT  --   --  40.6  PLT  --   --  221  APTT  --  25  --   LABPROT  --  13.5  --   INR  --  1.03  --   CREATININE 1.09*  --   --   TROPONINI <0.03  --   --     Estimated Creatinine Clearance: 32.3 mL/min (by C-G formula based on SCr of 1.09 mg/dL (H)).   Medical History: Past Medical History:  Diagnosis Date  . Atrial fibrillation ([place])   . GERD (gastroesophageal reflux disease)   . IPF (idiopathic pulmonary fibrosis) ([place]) 20111   by CT, PFTS done by Humphrey Rolls  . Mitral regurgitation   . Pulmonary embolism [facility])     Assessment: 83 yo female with PMH of PE in 2016, now found to have small PE and possible PNA.  Pharmacy consulted for Xarelto dosing.  Patient was originally started on heparin drip, drip has been discontinued at this time.    Plan:  Will start patient on Xarelto (Rivaroxaban) 15mg  BID for 21 days, followed by Xarelto 20mg  daily with evening meal. 20mg  dosing should begin on Feb 9th.   Pernell Dupre, PharmD, BCPS Clinical Pharmacist 03/04/2016 12:16 PM

## 2016-03-04 NOTE — Progress Notes (Signed)
Advanced care plan.  Purpose of the Encounter: CODE STATUS  Parties in Attendance: [place] Rm patient her self and family  Patient's Decision Capacity: Intact  Subjective/Patient's story: Patient is a 83 year old white female presenting with shortness of breath noted to have pulmonary embolism CHF and possible pneumonia She is noted to be tachycardic in the emergency room    Objective/Medical story Discussed goal of care and CODE STATUS with the patient, she states that she has been relatively healthy and would like everything done including resuscitation   Goals of care determination:  Full code   CODE STATUS: Full code   Time spent discussing advanced care planning: 15 minutes

## 2016-03-04 NOTE — ED Notes (Signed)
Pt in ultrasound. Family in room at this time.

## 2016-03-05 LAB — CBC
HEMATOCRIT: 37.1 % (ref 35.0–47.0)
HEMOGLOBIN: 12.6 g/dL (ref 12.0–16.0)
MCH: 30.8 pg (ref 26.0–34.0)
MCHC: 34 g/dL (ref 32.0–36.0)
MCV: 90.6 fL (ref 80.0–100.0)
Platelets: 170 10*3/uL (ref 150–440)
RBC: 4.09 MIL/uL (ref 3.80–5.20)
RDW: 15.4 % — AB (ref 11.5–14.5)
WBC: 6.6 10*3/uL (ref 3.6–11.0)

## 2016-03-05 LAB — BASIC METABOLIC PANEL
ANION GAP: 9 (ref 5–15)
BUN: 16 mg/dL (ref 6–20)
CALCIUM: 8.3 mg/dL — AB (ref 8.9–10.3)
CHLORIDE: 105 mmol/L (ref 101–111)
CO2: 25 mmol/L (ref 22–32)
Creatinine, Ser: 0.99 mg/dL (ref 0.44–1.00)
GFR calc Af Amer: 59 mL/min — ABNORMAL LOW (ref >60)
GFR calc non Af Amer: 51 mL/min — ABNORMAL LOW (ref >60)
GLUCOSE: 92 mg/dL (ref 65–99)
POTASSIUM: 3.6 mmol/L (ref 3.5–5.1)
Sodium: 139 mmol/L (ref 135–145)

## 2016-03-05 MED ORDER — DILTIAZEM HCL 30 MG PO TABS
30.0000 mg | ORAL_TABLET | Freq: Three times a day (TID) | ORAL | Status: DC
  Administered 2016-03-05 – 2016-03-06 (×3): 30 mg via ORAL
  Filled 2016-03-05 (×2): qty 1

## 2016-03-05 MED ORDER — OSELTAMIVIR PHOSPHATE 30 MG PO CAPS
30.0000 mg | ORAL_CAPSULE | Freq: Two times a day (BID) | ORAL | Status: DC
  Administered 2016-03-05 – 2016-03-06 (×3): 30 mg via ORAL
  Filled 2016-03-05 (×3): qty 1

## 2016-03-05 MED ORDER — OSELTAMIVIR PHOSPHATE 75 MG PO CAPS: 75.0000 mg | ORAL_CAPSULE | Freq: Two times a day (BID) | ORAL | Status: DC

## 2016-03-05 NOTE — Progress Notes (Signed)
[facility] NAME: Frances Kent    MR#:  [id number]  DATE OF BIRTH:  02/06/1931  SUBJECTIVE:  CHIEF COMPLAINT:   Chief Complaint  Patient presents with  . Shortness of Breath  . Tachycardia     Came with SOB- found to have CHF and a fib, alo have small PE on CT. On cardizem drip, HR stable now, supplemental oxygen. No chest pain.  REVIEW OF SYSTEMS:  CONSTITUTIONAL: No fever, fatigue or weakness.  EYES: No blurred or double vision.  EARS, NOSE, AND THROAT: No tinnitus or ear pain.  RESPIRATORY: No cough, shortness of breath, wheezing or hemoptysis.  CARDIOVASCULAR: No chest pain, orthopnea, edema.  GASTROINTESTINAL: No nausea, vomiting, diarrhea or abdominal pain.  GENITOURINARY: No dysuria, hematuria.  ENDOCRINE: No polyuria, nocturia,  HEMATOLOGY: No anemia, easy bruising or bleeding SKIN: No rash or lesion. MUSCULOSKELETAL: No joint pain or arthritis.   NEUROLOGIC: No tingling, numbness, weakness.  PSYCHIATRY: No anxiety or depression.   ROS  DRUG ALLERGIES:  No Known Allergies  VITALS:  Blood pressure (!) 101/50, pulse 98, temperature 99.7 F (37.6 C), temperature source Oral, resp. rate 16, weight 60.8 kg (134 lb), SpO2 95 %.  PHYSICAL EXAMINATION:  GENERAL:  83 y.o.-year-old patient lying in the bed with no acute distress.  EYES: Pupils equal, round, reactive to light and accommodation. No scleral icterus. Extraocular muscles intact.  HEENT: Head atraumatic, normocephalic. Oropharynx and nasopharynx clear.  NECK:  Supple, no jugular venous distention. No thyroid enlargement, no tenderness.  LUNGS: Normal breath sounds bilaterally, no wheezing, some crepitation. No use of accessory muscles of respiration.  CARDIOVASCULAR: S1, S2 normal. No murmurs, rubs, or gallops.  ABDOMEN: Soft, nontender, nondistended. Bowel sounds present. No organomegaly or mass.  EXTREMITIES: No pedal edema, cyanosis, or clubbing.   NEUROLOGIC: Cranial nerves II through XII are intact. Muscle strength 5/5 in all extremities. Sensation intact. Gait not checked.  PSYCHIATRIC: The patient is alert and oriented x 3.  SKIN: No obvious rash, lesion, or ulcer.   Physical Exam LABORATORY PANEL:   CBC  Recent Labs Lab 03/05/16 0532  WBC 6.6  HGB 12.6  HCT 37.1  PLT 170   ------------------------------------------------------------------------------------------------------------------  Chemistries   Recent Labs Lab 03/04/16 0816 03/05/16 0532  NA 141 139  K 5.0 3.6  CL 111 105  CO2 20* 25  GLUCOSE 159* 92  BUN 14 16  CREATININE 1.09* 0.99  CALCIUM 8.6* 8.3*  MG 2.0  --    ------------------------------------------------------------------------------------------------------------------  Cardiac Enzymes  Recent Labs Lab 03/04/16 0816 03/04/16 1706  TROPONINI <0.03 0.04*   ------------------------------------------------------------------------------------------------------------------  RADIOLOGY:  Ct Angio Chest Pe W Or Wo Contrast  Result Date: 03/04/2016 CLINICAL DATA:  83 year old female with shortness of breath since yesterday evening. SVT. EXAM: CT ANGIOGRAPHY CHEST WITH CONTRAST TECHNIQUE: Multidetector CT imaging of the chest was performed using the standard protocol during bolus administration of intravenous contrast. Multiplanar CT image reconstructions and MIPs were obtained to evaluate the vascular anatomy. CONTRAST:  75 mL of Isovue 370. COMPARISON:  Chest CT 12/22/2015. FINDINGS: Cardiovascular: Subsegmental sized nonocclusive filling defect noted in the right lower lobe (image 166 of series 5). No other larger central or segmental sized filling defects are noted. Heart size is mildly enlarged with left atrial dilatation. There is no significant pericardial fluid, thickening or pericardial calcification. There is aortic atherosclerosis, as well as atherosclerosis of the great vessels of the  mediastinum and the coronary arteries, including  calcified atherosclerotic plaque in the right coronary artery. Calcifications of the mitral annulus. Mediastinum/Nodes: Several borderline enlarged and minimally enlarged mediastinal and bilateral hilar lymph nodes are noted measuring up to 12 mm in short axis in the low right paratracheal nodal station. Esophagus is unremarkable in appearance. No axillary lymphadenopathy. Lungs/Pleura: There is mild diffuse ground-glass attenuation and interlobular septal thickening, suggesting a background of mild interstitial pulmonary edema. Additionally, in the lower lobes of the lungs bilaterally there is more profound thickening of the peribronchovascular interstitium and more severe peribronchovascular ground-glass attenuation, micro nodularity and frank airspace consolidation, likely sequela of recent aspiration. Small bilateral pleural effusions layering dependently. Upper Abdomen: Status post cholecystectomy.  Aortic atherosclerosis. Musculoskeletal: There are no aggressive appearing lytic or blastic lesions noted in the visualized portions of the skeleton. Review of the MIP images confirms the above findings. IMPRESSION: 1. Small nonocclusive subsegmental sized pulmonary embolus in the right lower lobe. This is of uncertain clinical significance. No larger more central or segmental sized pulmonary embolism is noted. 2. The appearance of the lower lobes of the lungs is concerning for aspiration pneumonitis/pneumonia. 3. In addition, there is evidence of congestive heart failure with mild interstitial pulmonary edema and small bilateral pleural effusions. 4. Cardiomegaly with left atrial dilatation. 5. Aortic atherosclerosis, in addition to left circumflex coronary artery disease. 6. Additional incidental findings, as above. Critical Value/emergent results were called by telephone at the time of interpretation on 03/04/2016 at 10:42 am to Nurse Marya Amsler for Dr. Delman Kitten, who  verbally acknowledged these results. Electronically Signed   By: Vinnie Langton M.D.   On: 03/04/2016 10:47   US Venous Img Lower Bilateral  Result Date: 03/04/2016 CLINICAL DATA:  Pulmonary embolism. EXAM: BILATERAL LOWER EXTREMITY VENOUS DOPPLER ULTRASOUND TECHNIQUE: Gray-scale sonography with graded compression, as well as color Doppler and duplex ultrasound were performed to evaluate the lower extremity deep venous systems from the level of the common femoral vein and including the common femoral, femoral, profunda femoral, popliteal and calf veins including the posterior tibial, peroneal and gastrocnemius veins when visible. The superficial great saphenous vein was also interrogated. Spectral Doppler was utilized to evaluate flow at rest and with distal augmentation maneuvers in the common femoral, femoral and popliteal veins. COMPARISON:  Right lower extremity venous duplex ultrasound on 12/21/2015 FINDINGS: RIGHT LOWER EXTREMITY Common Femoral Vein: No evidence of thrombus. Normal compressibility, respiratory phasicity and response to augmentation. Saphenofemoral Junction: No evidence of thrombus. Normal compressibility and flow on color Doppler imaging. Profunda Femoral Vein: No evidence of thrombus. Normal compressibility and flow on color Doppler imaging. Femoral Vein: No evidence of thrombus. Normal compressibility, respiratory phasicity and response to augmentation. Popliteal Vein: No evidence of thrombus. Normal compressibility, respiratory phasicity and response to augmentation. Calf Veins: No evidence of thrombus. Normal compressibility and flow on color Doppler imaging. Superficial Great Saphenous Vein: No evidence of thrombus. Normal compressibility and flow on color Doppler imaging. Venous Reflux:  None. Other Findings: No evidence of superficial thrombophlebitis or abnormal fluid collection. LEFT LOWER EXTREMITY Common Femoral Vein: Mild amount of nonocclusive mural thrombus in the left  common femoral vein has an identical appearance to the prior ultrasound study. Saphenofemoral Junction: No evidence of thrombus. Normal compressibility and flow on color Doppler imaging. Profunda Femoral Vein: No evidence of thrombus. Normal compressibility and flow on color Doppler imaging. Femoral Vein: No evidence of thrombus. Normal compressibility, respiratory phasicity and response to augmentation. Popliteal Vein: No evidence of thrombus. Normal compressibility, respiratory phasicity and response to  augmentation. Calf Veins: No evidence of thrombus. Normal compressibility and flow on color Doppler imaging. Superficial Great Saphenous Vein: No evidence of thrombus. Normal compressibility and flow on color Doppler imaging. Venous Reflux:  None. Other Findings: No evidence of superficial thrombophlebitis or abnormal fluid collection. IMPRESSION: No evidence of acute deep venous thrombosis. Mild amount of chronic nonocclusive thrombus in the left common femoral vein appears identical to the prior ultrasound. Electronically Signed   By: Aletta Edouard M.D.   On: 03/04/2016 13:05   Dg Chest Portable 1 View  Result Date: 03/04/2016 CLINICAL DATA:  Tachycardia, hypoxia, dyspnea. History of atrial fibrillation. EXAM: PORTABLE CHEST 1 VIEW COMPARISON:  Multiple exams, including 12/22/2015 CT scan FINDINGS: Mild enlargement of the cardiopericardial silhouette with cephalization of blood flow and faint interstitial accentuation. Atherosclerotic aortic arch. Prominence of the central pulmonary vasculature. New 9 mm apical left lung lesion, appears to be different from the prior pleuroparenchymal scarring in this region, and not readily seen on prior chest radiographs. Mild left lower lobe airspace opacity with indistinctness of the left posterior hemidiaphragm. IMPRESSION: 1. Possible 9 mm left apical lung nodule common new compared to prior exams. Chest CT or close follow up chest radiography recommended. 2. Mild  enlargement of the cardiopericardial silhouette with cephalization of blood flow and mild interstitial accentuation, suspicious for mild interstitial edema. 3.  Atherosclerotic calcification of the aortic arch. 4. Linear opacities in the retrocardiac region with indistinctness of the left hemidiaphragm, possibly from confluent edema or bronchopneumonia. 5. Prominent central pulmonary vasculature likely from pulmonary arterial hypertension. Electronically Signed   By: Van Clines M.D.   On: 03/04/2016 08:33    ASSESSMENT AND PLAN:   Active Problems:   SOB (shortness of breath)  1. Acute hypoxic respiratory failure Multifactorial Acute diastolic CHF will treat with IV Lasix suspect related to her mitral valve disease, echocardiogram in November 2017 , so will not repeat Acute pulmonary embolism patient was previously on eliquis states there were severe neck pain, Now on Xarelto , check venous Doppler lower extremity Cough with pneumonia- WBC count is normal, Non elevated pro calcitonin level,  stop Abx,  * Influenza A- tamiflu for 5 days.  2. Atrial fibrillation with rapid ventricular rate  on oral Cardizem and IV drip. Checked a TSH Appreciated cardio consult. Xarelto.  3. GERD We will treat with Protonix  4.  CODE STATUS discussed with the patient she would like to be a full code   All the records are reviewed and case discussed with Care Management/Social Workerr. Management plans discussed with the patient, family and they are in agreement.  CODE STATUS: Full.  TOTAL TIME TAKING CARE OF THIS PATIENT: 35 minutes.     POSSIBLE D/C IN 1-2 DAYS, DEPENDING ON CLINICAL CONDITION.   Vaughan Basta M.D on 03/05/2016   Between 7am to 6pm - Pager - [phone]  After 6pm go to [email or web address] - password [email or web address] [facility]  Office  [phone]  CC: Primary care physician; [name], MD  Note: This dictation was prepared with Dragon  dictation along with smaller phrase technology. Any transcriptional errors that result from this process are unintentional.

## 2016-03-05 NOTE — Progress Notes (Signed)
PHARMACY NOTE:  ANTIMICROBIAL RENAL DOSAGE ADJUSTMENT  Current antimicrobial regimen includes a mismatch between antimicrobial dosage and estimated renal function.   Current antimicrobial dosage:  oseltamivir 75 mg BID  Indication: Influenza A +  Renal Function:  Estimated Creatinine Clearance: 35.6 mL/min (by C-G formula based on SCr of 0.99 mg/dL).  Antimicrobial dosage has been changed to:  oseltamivir 30 BID    Thank you for allowing pharmacy to be a part of this patient's care.  Rocky Morel, [facility] 03/05/2016 12:23 PM

## 2016-03-05 NOTE — Consult Note (Signed)
Reason for Consult: Shortness of breath and tachycardia atrial fibrillation Referring Physician: Dr. Ulysees Barns hospitalist, Dr. Helene Kelp to her primary Cardiologist Dr. Pleas Koch is an 83 y.o. female.  HPI: Patient's 83 year old female known history of pulmonary embolus in the past and 2016 she was 6 months without request but had a reaction with neck stiffness which was subsequently discontinued the patient states that done reasonably well except for recently developed shortness of breath symptoms over the past week he got progressively worse with dry cough for the past 2 days body aches lower extremity edema but no fever chills or sweats skin in emergency room. The patient was found to have a small pulmonary embolus on CT but was found to be in atrial fibrillation with possible bronchitis and pneumonia since she was admitted for further evaluation and care placed on supplemental oxygen therapy patient was found to have significant hypotension which has subsequently improved but now here for further evaluation and care denies any discrete chest pain symptoms.  Past Medical History:  Diagnosis Date  . Atrial fibrillation ([place])   . GERD (gastroesophageal reflux disease)   . IPF (idiopathic pulmonary fibrosis) ([place]) [address]   by CT, PFTS done by Humphrey Rolls  . Mitral regurgitation   . Pulmonary embolism [facility])     Past Surgical History:  Procedure Laterality Date  . APPENDECTOMY  1960  . CHOLECYSTECTOMY  1985  . OVARIAN CYST REMOVAL      Family History  Problem Relation Age of Onset  . Cancer Mother 46    breast cancer, lived to 73,   . Cancer Son 71    pancreatic cancer  . Heart disease Son     CAD, Tobacco Abuse   . Heart disease Maternal Grandmother 80    died of massive MI    Social History:  reports that she has never smoked. She has never used smokeless tobacco. She reports that she does not drink alcohol or use drugs.  Allergies: No Known Allergies  Medications: I  have reviewed the patient's current medications.  Results for orders placed or performed during the hospital encounter of 03/04/16 (from the past 48 hour(s))  Lactic acid, plasma     Status: Abnormal   Collection Time: 03/04/16  8:16 AM  Result Value Ref Range   Lactic Acid, Venous 2.6 (HH) 0.5 - 1.9 mmol/L    Comment: CRITICAL RESULT CALLED TO, READ BACK BY AND VERIFIED WITH HEATHER FISHER @ [id number] 03/04/16 BY [facility]   Magnesium     Status: None   Collection Time: 03/04/16  8:16 AM  Result Value Ref Range   Magnesium 2.0 1.7 - 2.4 mg/dL  Troponin I     Status: None   Collection Time: 03/04/16  8:16 AM  Result Value Ref Range   Troponin I <0.03 <0.03 ng/mL  TSH     Status: None   Collection Time: 03/04/16  8:16 AM  Result Value Ref Range   TSH 1.511 0.350 - 4.500 uIU/mL    Comment: Performed by a 3rd Generation assay with a functional sensitivity of <=0.01 uIU/mL.  Basic metabolic panel     Status: Abnormal   Collection Time: 03/04/16  8:16 AM  Result Value Ref Range   Sodium 141 135 - 145 mmol/L   Potassium 5.0 3.5 - 5.1 mmol/L    Comment: HEMOLYSIS AT THIS LEVEL MAY AFFECT RESULT   Chloride 111 101 - 111 mmol/L   CO2 20 (L) 22 - 32 mmol/L  Glucose, Bld 159 (H) 65 - 99 mg/dL   BUN 14 6 - 20 mg/dL   Creatinine, Ser 1.09 (H) 0.44 - 1.00 mg/dL   Calcium 8.6 (L) 8.9 - 10.3 mg/dL   GFR calc non Af Amer 46 (L) >60 mL/min   GFR calc Af Amer 53 (L) >60 mL/min    Comment: (NOTE) The eGFR has been calculated using the CKD EPI equation. This calculation has not been validated in all clinical situations. eGFR's persistently <60 mL/min signify possible Chronic Kidney Disease.    Anion gap 10 5 - 15  Brain natriuretic peptide     Status: Abnormal   Collection Time: 03/04/16  8:45 AM  Result Value Ref Range   B Natriuretic Peptide 483.0 (H) 0.0 - 100.0 pg/mL  APTT     Status: None   Collection Time: 03/04/16  8:50 AM  Result Value Ref Range   aPTT 25 24 - 36 seconds  Protime-INR      Status: None   Collection Time: 03/04/16  8:50 AM  Result Value Ref Range   Prothrombin Time 13.5 11.4 - 15.2 seconds   INR 1.03   Procalcitonin - Baseline     Status: None   Collection Time: 03/04/16  8:50 AM  Result Value Ref Range   Procalcitonin <0.10 ng/mL    Comment:        Interpretation: PCT (Procalcitonin) <= 0.5 ng/mL: Systemic infection (sepsis) is not likely. Local bacterial infection is possible. (NOTE)         ICU PCT Algorithm               Non ICU PCT Algorithm    ----------------------------     ------------------------------         PCT < 0.25 ng/mL                 PCT < 0.1 ng/mL     Stopping of antibiotics            Stopping of antibiotics       strongly encouraged.               strongly encouraged.    ----------------------------     ------------------------------       PCT level decrease by               PCT < 0.25 ng/mL       >= 80% from peak PCT       OR PCT 0.25 - 0.5 ng/mL          Stopping of antibiotics                                             encouraged.     Stopping of antibiotics           encouraged.    ----------------------------     ------------------------------       PCT level decrease by              PCT >= 0.25 ng/mL       < 80% from peak PCT        AND PCT >= 0.5 ng/mL            Continuin g antibiotics  encouraged.       Continuing antibiotics            encouraged.    ----------------------------     ------------------------------     PCT level increase compared          PCT > 0.5 ng/mL         with peak PCT AND          PCT >= 0.5 ng/mL             Escalation of antibiotics                                          strongly encouraged.      Escalation of antibiotics        strongly encouraged.   Blood gas, venous     Status: Abnormal (Preliminary result)   Collection Time: 03/04/16  8:55 AM  Result Value Ref Range   pH, Ven 7.34 7.250 - 7.430   pCO2, Ven 43 (L) 44.0 - 60.0 mmHg    pO2, Ven <31.0 (LL) 32.0 - 45.0 mmHg    Comment: CRITICAL RESULT CALLED TO, READ BACK BY AND VERIFIED WITH: Velta Addison RN ON [id number] AT 0908    Bicarbonate 23.2 20.0 - 28.0 mmol/L   Acid-base deficit 2.6 (H) 0.0 - 2.0 mmol/L   O2 Saturation PENDING %   Patient temperature 37.0    Collection site VEIN    Sample type VEIN   CBC     Status: Abnormal   Collection Time: 03/04/16  9:07 AM  Result Value Ref Range   WBC 9.5 3.6 - 11.0 K/uL   RBC 4.49 3.80 - 5.20 MIL/uL   Hemoglobin 13.5 12.0 - 16.0 g/dL   HCT 40.6 35.0 - 47.0 %   MCV 90.5 80.0 - 100.0 fL   MCH 30.0 26.0 - 34.0 pg   MCHC 33.1 32.0 - 36.0 g/dL   RDW 15.3 (H) 11.5 - 14.5 %   Platelets 221 150 - 440 K/uL  Lactic acid, plasma     Status: Abnormal   Collection Time: 03/04/16 11:52 AM  Result Value Ref Range   Lactic Acid, Venous 2.4 (HH) 0.5 - 1.9 mmol/L    Comment: CRITICAL RESULT CALLED TO, READ BACK BY AND VERIFIED WITH ALLY RILEY ON 03/04/16 AT 1254 MNS   Influenza panel by PCR (type A & B)     Status: Abnormal   Collection Time: 03/04/16 11:52 AM  Result Value Ref Range   Influenza A By PCR POSITIVE (A) NEGATIVE   Influenza B By PCR NEGATIVE NEGATIVE    Comment: (NOTE) The Xpert Xpress Flu assay is intended as an aid in the diagnosis of  influenza and should not be used as a sole basis for treatment.  This  assay is FDA approved for nasopharyngeal swab specimens only. Nasal  washings and aspirates are unacceptable for Xpert Xpress Flu testing.   TSH     Status: None   Collection Time: 03/04/16  5:06 PM  Result Value Ref Range   TSH 0.947 0.350 - 4.500 uIU/mL    Comment: Performed by a 3rd Generation assay with a functional sensitivity of <=0.01 uIU/mL.  Troponin I     Status: Abnormal   Collection Time: 03/04/16  5:06 PM  Result Value Ref Range   Troponin I 0.04 (HH) <0.03 ng/mL    Comment:  CRITICAL RESULT CALLED TO, READ BACK BY AND VERIFIED WITH ALLIE RILEY @ 1800 03/04/16 BY [place]   CBC     Status:  Abnormal   Collection Time: 03/05/16  5:32 AM  Result Value Ref Range   WBC 6.6 3.6 - 11.0 K/uL   RBC 4.09 3.80 - 5.20 MIL/uL   Hemoglobin 12.6 12.0 - 16.0 g/dL   HCT 37.1 35.0 - 47.0 %   MCV 90.6 80.0 - 100.0 fL   MCH 30.8 26.0 - 34.0 pg   MCHC 34.0 32.0 - 36.0 g/dL   RDW 15.4 (H) 11.5 - 14.5 %   Platelets 170 150 - 440 K/uL  Basic metabolic panel     Status: Abnormal   Collection Time: 03/05/16  5:32 AM  Result Value Ref Range   Sodium 139 135 - 145 mmol/L   Potassium 3.6 3.5 - 5.1 mmol/L   Chloride 105 101 - 111 mmol/L   CO2 25 22 - 32 mmol/L   Glucose, Bld 92 65 - 99 mg/dL   BUN 16 6 - 20 mg/dL   Creatinine, Ser 0.99 0.44 - 1.00 mg/dL   Calcium 8.3 (L) 8.9 - 10.3 mg/dL   GFR calc non Af Amer 51 (L) >60 mL/min   GFR calc Af Amer 59 (L) >60 mL/min    Comment: (NOTE) The eGFR has been calculated using the CKD EPI equation. This calculation has not been validated in all clinical situations. eGFR's persistently <60 mL/min signify possible Chronic Kidney Disease.    Anion gap 9 5 - 15    Ct Angio Chest Pe W Or Wo Contrast  Result Date: 03/04/2016 CLINICAL DATA:  83 year old female with shortness of breath since yesterday evening. SVT. EXAM: CT ANGIOGRAPHY CHEST WITH CONTRAST TECHNIQUE: Multidetector CT imaging of the chest was performed using the standard protocol during bolus administration of intravenous contrast. Multiplanar CT image reconstructions and MIPs were obtained to evaluate the vascular anatomy. CONTRAST:  75 mL of Isovue 370. COMPARISON:  Chest CT 12/22/2015. FINDINGS: Cardiovascular: Subsegmental sized nonocclusive filling defect noted in the right lower lobe (image 166 of series 5). No other larger central or segmental sized filling defects are noted. Heart size is mildly enlarged with left atrial dilatation. There is no significant pericardial fluid, thickening or pericardial calcification. There is aortic atherosclerosis, as well as atherosclerosis of the great  vessels of the mediastinum and the coronary arteries, including calcified atherosclerotic plaque in the right coronary artery. Calcifications of the mitral annulus. Mediastinum/Nodes: Several borderline enlarged and minimally enlarged mediastinal and bilateral hilar lymph nodes are noted measuring up to 12 mm in short axis in the low right paratracheal nodal station. Esophagus is unremarkable in appearance. No axillary lymphadenopathy. Lungs/Pleura: There is mild diffuse ground-glass attenuation and interlobular septal thickening, suggesting a background of mild interstitial pulmonary edema. Additionally, in the lower lobes of the lungs bilaterally there is more profound thickening of the peribronchovascular interstitium and more severe peribronchovascular ground-glass attenuation, micro nodularity and frank airspace consolidation, likely sequela of recent aspiration. Small bilateral pleural effusions layering dependently. Upper Abdomen: Status post cholecystectomy.  Aortic atherosclerosis. Musculoskeletal: There are no aggressive appearing lytic or blastic lesions noted in the visualized portions of the skeleton. Review of the MIP images confirms the above findings. IMPRESSION: 1. Small nonocclusive subsegmental sized pulmonary embolus in the right lower lobe. This is of uncertain clinical significance. No larger more central or segmental sized pulmonary embolism is noted. 2. The appearance of the lower lobes of the lungs  is concerning for aspiration pneumonitis/pneumonia. 3. In addition, there is evidence of congestive heart failure with mild interstitial pulmonary edema and small bilateral pleural effusions. 4. Cardiomegaly with left atrial dilatation. 5. Aortic atherosclerosis, in addition to left circumflex coronary artery disease. 6. Additional incidental findings, as above. Critical Value/emergent results were called by telephone at the time of interpretation on 03/04/2016 at 10:42 am to Nurse Marya Amsler for Dr.  Delman Kitten, who verbally acknowledged these results. Electronically Signed   By: Vinnie Langton M.D.   On: 03/04/2016 10:47   US Venous Img Lower Bilateral  Result Date: 03/04/2016 CLINICAL DATA:  Pulmonary embolism. EXAM: BILATERAL LOWER EXTREMITY VENOUS DOPPLER ULTRASOUND TECHNIQUE: Gray-scale sonography with graded compression, as well as color Doppler and duplex ultrasound were performed to evaluate the lower extremity deep venous systems from the level of the common femoral vein and including the common femoral, femoral, profunda femoral, popliteal and calf veins including the posterior tibial, peroneal and gastrocnemius veins when visible. The superficial great saphenous vein was also interrogated. Spectral Doppler was utilized to evaluate flow at rest and with distal augmentation maneuvers in the common femoral, femoral and popliteal veins. COMPARISON:  Right lower extremity venous duplex ultrasound on 12/21/2015 FINDINGS: RIGHT LOWER EXTREMITY Common Femoral Vein: No evidence of thrombus. Normal compressibility, respiratory phasicity and response to augmentation. Saphenofemoral Junction: No evidence of thrombus. Normal compressibility and flow on color Doppler imaging. Profunda Femoral Vein: No evidence of thrombus. Normal compressibility and flow on color Doppler imaging. Femoral Vein: No evidence of thrombus. Normal compressibility, respiratory phasicity and response to augmentation. Popliteal Vein: No evidence of thrombus. Normal compressibility, respiratory phasicity and response to augmentation. Calf Veins: No evidence of thrombus. Normal compressibility and flow on color Doppler imaging. Superficial Great Saphenous Vein: No evidence of thrombus. Normal compressibility and flow on color Doppler imaging. Venous Reflux:  None. Other Findings: No evidence of superficial thrombophlebitis or abnormal fluid collection. LEFT LOWER EXTREMITY Common Femoral Vein: Mild amount of nonocclusive mural thrombus  in the left common femoral vein has an identical appearance to the prior ultrasound study. Saphenofemoral Junction: No evidence of thrombus. Normal compressibility and flow on color Doppler imaging. Profunda Femoral Vein: No evidence of thrombus. Normal compressibility and flow on color Doppler imaging. Femoral Vein: No evidence of thrombus. Normal compressibility, respiratory phasicity and response to augmentation. Popliteal Vein: No evidence of thrombus. Normal compressibility, respiratory phasicity and response to augmentation. Calf Veins: No evidence of thrombus. Normal compressibility and flow on color Doppler imaging. Superficial Great Saphenous Vein: No evidence of thrombus. Normal compressibility and flow on color Doppler imaging. Venous Reflux:  None. Other Findings: No evidence of superficial thrombophlebitis or abnormal fluid collection. IMPRESSION: No evidence of acute deep venous thrombosis. Mild amount of chronic nonocclusive thrombus in the left common femoral vein appears identical to the prior ultrasound. Electronically Signed   By: Aletta Edouard M.D.   On: 03/04/2016 13:05   Dg Chest Portable 1 View  Result Date: 03/04/2016 CLINICAL DATA:  Tachycardia, hypoxia, dyspnea. History of atrial fibrillation. EXAM: PORTABLE CHEST 1 VIEW COMPARISON:  Multiple exams, including 12/22/2015 CT scan FINDINGS: Mild enlargement of the cardiopericardial silhouette with cephalization of blood flow and faint interstitial accentuation. Atherosclerotic aortic arch. Prominence of the central pulmonary vasculature. New 9 mm apical left lung lesion, appears to be different from the prior pleuroparenchymal scarring in this region, and not readily seen on prior chest radiographs. Mild left lower lobe airspace opacity with indistinctness of the left posterior hemidiaphragm. IMPRESSION:  1. Possible 9 mm left apical lung nodule common new compared to prior exams. Chest CT or close follow up chest radiography recommended.  2. Mild enlargement of the cardiopericardial silhouette with cephalization of blood flow and mild interstitial accentuation, suspicious for mild interstitial edema. 3.  Atherosclerotic calcification of the aortic arch. 4. Linear opacities in the retrocardiac region with indistinctness of the left hemidiaphragm, possibly from confluent edema or bronchopneumonia. 5. Prominent central pulmonary vasculature likely from pulmonary arterial hypertension. Electronically Signed   By: Van Clines M.D.   On: 03/04/2016 08:33    Review of Systems  Constitutional: Positive for malaise/fatigue.  HENT: Positive for congestion.   Eyes: Negative.   Respiratory: Positive for shortness of breath.   Cardiovascular: Positive for palpitations, orthopnea and leg swelling.  Gastrointestinal: Negative.   Genitourinary: Negative.   Musculoskeletal: Negative.   Skin: Negative.   Neurological: Positive for weakness.  Endo/Heme/Allergies: Negative.   Psychiatric/Behavioral: Negative.    Blood pressure (!) 102/53, pulse (!) 101, temperature 98.6 F (37 C), temperature source Oral, resp. rate 18, weight 60.8 kg (134 lb), SpO2 97 %. Physical Exam  Nursing note and vitals reviewed. Constitutional: She is oriented to person, place, and time. She appears well-developed and well-nourished.  HENT:  Head: Normocephalic and atraumatic.  Eyes: Conjunctivae and EOM are normal. Pupils are equal, round, and reactive to light.  Neck: Normal range of motion. Neck supple.  Cardiovascular: Normal rate, S1 normal, S2 normal and normal pulses.  An irregularly irregular rhythm present. Exam reveals gallop.   Murmur heard.  Systolic murmur is present with a grade of 2/6  Respiratory: Effort normal and breath sounds normal.  GI: Soft. Bowel sounds are normal.  Musculoskeletal: Normal range of motion.  Neurological: She is alert and oriented to person, place, and time. She has normal reflexes.  Skin: Skin is warm and dry.   Psychiatric: She has a normal mood and affect.    Assessment/Plan: Shortness of breath Pulmonary embolus Bronchitis possible pneumonia Atrial fibrillation Tachycardia GERD Hypertension Mitral regurgitation . Agree with admission for shortness of breath Continue supplemental oxygen therapy Consider inhalers therapy as needed Recommend long-term anticoagulation for A. fib as well as pulmonary embolus consider Coumadin or Xarelto Continue diltiazem therapy for rate control Agree with Protonix therapy for reflux symptoms Conservative medical therapy because of mitral regurgitation  Consider continued medical therapy  Beverly Suriano D Andre Gallego 03/05/2016, 9:00 AM

## 2016-03-06 ENCOUNTER — Encounter: Payer: Self-pay | Admitting: *Deleted

## 2016-03-06 LAB — PROCALCITONIN: PROCALCITONIN: 0.39 ng/mL

## 2016-03-06 MED ORDER — RIVAROXABAN 15 MG PO TABS: 15.0000 mg | ORAL_TABLET | Freq: Two times a day (BID) | ORAL | 0 refills | Status: DC

## 2016-03-06 MED ORDER — RIVAROXABAN 20 MG PO TABS: 20.0000 mg | ORAL_TABLET | Freq: Every day | ORAL | 0 refills | Status: DC

## 2016-03-06 MED ORDER — RIVAROXABAN (XARELTO) VTE STARTER PACK (15 & 20 MG): ORAL_TABLET | ORAL | 0 refills | Status: DC

## 2016-03-06 MED ORDER — OSELTAMIVIR PHOSPHATE 30 MG PO CAPS: 30.0000 mg | ORAL_CAPSULE | Freq: Two times a day (BID) | ORAL | 0 refills | Status: DC

## 2016-03-06 MED ORDER — DILTIAZEM HCL ER COATED BEADS 120 MG PO CP24: 120.0000 mg | ORAL_CAPSULE | Freq: Every day | ORAL | 0 refills | Status: DC

## 2016-03-06 NOTE — Progress Notes (Signed)
Discharge instructions given. IV and tele removed. Prescriptions given to patient along with education on new meds. Also instructed on afib and blood pressure. Questions answered and patient verbalized understanding.

## 2016-03-06 NOTE — Discharge Instructions (Signed)
Follow with cardiology clinic in 1 week. °

## 2016-03-06 NOTE — Discharge Summary (Signed)
[name] [place] NAME: Frances Kent    MR#:  [id number]  DATE OF BIRTH:  01-24-1931  DATE OF ADMISSION:  03/04/2016 ADMITTING PHYSICIAN: Dustin Flock, MD  DATE OF DISCHARGE: 03/06/2016  PRIMARY CARE PHYSICIAN: [name], MD    ADMISSION DIAGNOSIS:  Hypoxia [R09.02] Pulmonary edema, acute ([place]) [J81.0] Atrial fibrillation with RVR (HCC) [I48.91] Pulmonary emboli (HCC) [I26.99] Other pulmonary embolism without acute cor pulmonale, unspecified chronicity (HCC) [I26.99]  DISCHARGE DIAGNOSIS:  Active Problems:   SOB (shortness of breath)  influenza   A fib  SECONDARY DIAGNOSIS:   Past Medical History:  Diagnosis Date  . Atrial fibrillation ([place])   . GERD (gastroesophageal reflux disease)   . IPF (idiopathic pulmonary fibrosis) ([place]) 20111   by CT, PFTS done by Humphrey Rolls  . Mitral regurgitation   . Pulmonary embolism ([place])     HOSPITAL COURSE:   1. Acute hypoxic respiratory failure Multifactorial Acute diastolic CHF will treat with IV Lasix suspect related to her mitral valve disease, echocardiogram in November 2017 , so will not repeat Acute pulmonary embolism patient was previously on eliquis states there were severe neck pain, Now on Xarelto , check venous Doppler lower extremity Cough with pneumonia- WBC count is normal, Non elevated pro calcitonin level, stop Abx,   better today.   * Influenza A- tamiflu for 5 days.  2. Atrial fibrillation with rapid ventricular rate  on oral Cardizem and IV drip. Checked a TSH Appreciated cardio consult. Xarelto.   Started oral Cardizem and rate under control.   Follow in cardio clinic.  3. GERD We will treat with Protonix  4. CODE STATUS discussed with the patient she would like to be a full code  DISCHARGE CONDITIONS:   Stable.  CONSULTS OBTAINED:  Treatment Team:  Yolonda Kida, MD  DRUG ALLERGIES:  No Known Allergies  DISCHARGE  MEDICATIONS:   Current Discharge Medication List    START taking these medications   Details  diltiazem (CARDIZEM CD) 120 MG 24 hr capsule Take 1 capsule (120 mg total) by mouth daily. Qty: 30 capsule, Refills: 0    oseltamivir (TAMIFLU) 30 MG capsule Take 1 capsule (30 mg total) by mouth 2 (two) times daily. Qty: 6 capsule, Refills: 0    Rivaroxaban 15 & 20 MG TBPK Take as directed on package: Start with one 15mg  tablet by mouth twice a day with food. On Day 22, switch to one 20mg  tablet once a day with food. Qty: 51 each, Refills: 0      CONTINUE these medications which have NOT CHANGED   Details  acidophilus (RISAQUAD) CAPS capsule Take 1 capsule by mouth daily. Reported on 04/20/2015    Cholecalciferol (VITAMIN D3) 1000 units CAPS Take by mouth.    diazepam (VALIUM) 5 MG tablet Take 1 tablet (5 mg total) by mouth every 12 (twelve) hours as needed for anxiety or muscle spasms. Qty: 60 tablet, Refills: 1    pantoprazole (PROTONIX) 40 MG tablet Take 40 mg by mouth daily as needed.     bevacizumab (AVASTIN) 1.25 mg/0.1 mL SOLN by Intravitreal route every 6 (six) weeks. For macular degeneration         DISCHARGE INSTRUCTIONS:    Follow with cardio clinic.  If you experience worsening of your admission symptoms, develop shortness of breath, life threatening emergency, suicidal or homicidal thoughts you must seek medical attention immediately by calling 911 or calling your MD immediately  if  symptoms less severe.  You Must read complete instructions/literature along with all the possible adverse reactions/side effects for all the Medicines you take and that have been prescribed to you. Take any new Medicines after you have completely understood and accept all the possible adverse reactions/side effects.   Please note  You were cared for by a hospitalist during your hospital stay. If you have any questions about your discharge medications or the care you received while you were  in the hospital after you are discharged, you can call the unit and asked to speak with the hospitalist on call if the hospitalist that took care of you is not available. Once you are discharged, your primary care physician will handle any further medical issues. Please note that NO REFILLS for any discharge medications will be authorized once you are discharged, as it is imperative that you return to your primary care physician (or establish a relationship with a primary care physician if you do not have one) for your aftercare needs so that they can reassess your need for medications and monitor your lab values.    Today   CHIEF COMPLAINT:   Chief Complaint  Patient presents with  . Shortness of Breath  . Tachycardia    HISTORY OF PRESENT ILLNESS:  Frances Kent  is a 83 y.o. female with a known history of Pulmonary embolism in 2016 was treated for 6 months and then blood that was stopped. She also has a history of mitral valve regurg as well as stenosis as well as tricuspid valve dysfunction who is presenting to the ED with complaint of shortness of breath ongoing for the past 1 week has progressively gotten worse. Patient also has had a dry cough for the past 2 days. She complains of aches in both of her lower extremities. Denies any fevers or chills. Denies any chest pain or palpitations. In the emergency room is noted to have a pulmonary embolism which is small as well as congestive heart failure as well as possible pneumonia.   VITAL SIGNS:  Blood pressure (!) 104/54, pulse 80, temperature 99.7 F (37.6 C), temperature source Oral, resp. rate 18, weight 60.8 kg (134 lb), SpO2 91 %.  I/O:   Intake/Output Summary (Last 24 hours) at 03/06/16 1357 Last data filed at 03/06/16 0729  Gross per 24 hour  Intake            237.5 ml  Output              500 ml  Net           -262.5 ml    PHYSICAL EXAMINATION:  GENERAL:  83 y.o.-year-old patient lying in the bed with no acute distress.   EYES: Pupils equal, round, reactive to light and accommodation. No scleral icterus. Extraocular muscles intact.  HEENT: Head atraumatic, normocephalic. Oropharynx and nasopharynx clear.  NECK:  Supple, no jugular venous distention. No thyroid enlargement, no tenderness.  LUNGS: Normal breath sounds bilaterally, no wheezing, rales,rhonchi or crepitation. No use of accessory muscles of respiration.  CARDIOVASCULAR: S1, S2 normal. Irregular rhythm. No murmurs, rubs, or gallops.  ABDOMEN: Soft, non-tender, non-distended. Bowel sounds present. No organomegaly or mass.  EXTREMITIES: No pedal edema, cyanosis, or clubbing.  NEUROLOGIC: Cranial nerves II through XII are intact. Muscle strength 5/5 in all extremities. Sensation intact. Gait not checked.  PSYCHIATRIC: The patient is alert and oriented x 3.  SKIN: No obvious rash, lesion, or ulcer.   DATA REVIEW:   CBC  Recent Labs Lab 03/05/16 0532  WBC 6.6  HGB 12.6  HCT 37.1  PLT 170    Chemistries   Recent Labs Lab 03/04/16 0816 03/05/16 0532  NA 141 139  K 5.0 3.6  CL 111 105  CO2 20* 25  GLUCOSE 159* 92  BUN 14 16  CREATININE 1.09* 0.99  CALCIUM 8.6* 8.3*  MG 2.0  --     Cardiac Enzymes  Recent Labs Lab 03/04/16 1706  TROPONINI 0.04*    Microbiology Results  Results for orders placed or performed in visit on 02/05/13  Urine culture     Status: None   Collection Time: 02/05/13  2:08 PM  Result Value Ref Range Status   Colony Count NO GROWTH  Final   Organism ID, Bacteria NO GROWTH  Final    RADIOLOGY:  No results found.  EKG:   Orders placed or performed during the hospital encounter of 03/04/16  . EKG 12-Lead  . EKG 12-Lead      Management plans discussed with the patient, family and they are in agreement.  CODE STATUS:     Code Status Orders        Start     Ordered   03/04/16 1154  Full code  Continuous     03/04/16 1153    Code Status History    Date Active Date Inactive Code Status  Order ID Comments User Context   12/04/2014 12:15 PM 12/05/2014  4:04 PM Full Code [id number]  Vaughan Basta, MD Inpatient   12/02/2014  2:56 PM 12/03/2014  6:03 PM Full Code [id number]  Aldean Jewett, MD ED    Advance Directive Documentation   Flowsheet Row Most Recent Value  Type of Advance Directive  Healthcare Power of [place], Living will  Pre-existing out of facility DNR order (yellow form or pink MOST form)  No data  "MOST" Form in Place?  No data      TOTAL TIME TAKING CARE OF THIS PATIENT: 35 minutes.    Vaughan Basta M.D on 03/06/2016 at 1:57 PM  Between 7am to 6pm - Pager - [phone]  After 6pm go to [email or web address] - password [email or web address] [facility]  Office  [phone]  CC: Primary care physician; [name], MD   Note: This dictation was prepared with Dragon dictation along with smaller phrase technology. Any transcriptional errors that result from this process are unintentional.

## 2016-03-06 NOTE — Progress Notes (Signed)
Pt. Slept well throughout the night waking only to use rest room. No signs or c/o pain, SOB, acute distress noted. Cardizem drip continues, daughter at bedside.

## 2016-03-06 NOTE — Progress Notes (Addendum)
Called by care management stating xarelto coupon has not been given to patient. Patient has already discharged. Called listed contacts in system with no answer from either number. Left a voicemail for [name], patient's daughter, to have her come and pick up the coupon at the 2A desk from the secretary.

## 2016-03-06 NOTE — Progress Notes (Signed)
Ambulated patient around nurses station. Heart rate got as high as 130 for a few seconds, but stayed mostly around 110. At rest heart rate is 80-90. O2 sat after ambulating was 96%. Patient states she feels that she is a little weak, but is ready to go home.

## 2016-03-08 ENCOUNTER — Telehealth: Payer: Self-pay | Admitting: Internal Medicine

## 2016-03-08 NOTE — Telephone Encounter (Signed)
Transition Care Management Follow-up Telephone Call  How have you been since you were released from the hospital? I just feel tired and no appetite, all I have done is just sit around.   Do you understand why you were in the hospital? Yes, because my heart rate was so high I was SOB.   Do you understand the discharge instrcutions? Yes  Items Reviewed:  Medications reviewed: Yes, added Xarelto, Cardizem.  Allergies reviewed: Yes  Dietary changes reviewed: Yes heart healthy Referrals reviewed: Yes  Functional Questionnaire:  Activities of Daily Living (ADLs):   She states they are independent in the following: I can do all my ADLs States they require assistance with the following:  Requires no assist   Any transportation issues/concerns?: Yes, I don't feel like driving.   Any patient concerns?  Just why I am tired and nothing taste good.   Confirmed importance and date/time of follow-up visits scheduled: Yes   Confirmed with patient if condition begins to worsen call PCP or go to the ER.  Patient was given the Call-a-Nurse line [phone]: Yes

## 2016-03-09 ENCOUNTER — Encounter: Payer: Self-pay | Admitting: Internal Medicine

## 2016-03-09 ENCOUNTER — Ambulatory Visit (INDEPENDENT_AMBULATORY_CARE_PROVIDER_SITE_OTHER): Payer: Medicare HMO | Admitting: Internal Medicine

## 2016-03-09 DIAGNOSIS — I482 Chronic atrial fibrillation, unspecified: Secondary | ICD-10-CM

## 2016-03-09 DIAGNOSIS — I2609 Other pulmonary embolism with acute cor pulmonale: Secondary | ICD-10-CM

## 2016-03-09 DIAGNOSIS — J811 Chronic pulmonary edema: Secondary | ICD-10-CM

## 2016-03-09 DIAGNOSIS — Z09 Encounter for follow-up examination after completed treatment for conditions other than malignant neoplasm: Secondary | ICD-10-CM

## 2016-03-09 LAB — BLOOD GAS, VENOUS
Acid-base deficit: 2.6 mmol/L — ABNORMAL HIGH (ref 0.0–2.0)
Bicarbonate: 23.2 mmol/L (ref 20.0–28.0)
PATIENT TEMPERATURE: 37
pCO2, Ven: 43 mmHg — ABNORMAL LOW (ref 44.0–60.0)
pH, Ven: 7.34 (ref 7.250–7.430)

## 2016-03-09 MED ORDER — FUROSEMIDE 20 MG PO TABS: 20.0000 mg | ORAL_TABLET | Freq: Every day | ORAL | 0 refills | Status: DC

## 2016-03-09 NOTE — Patient Instructions (Signed)
continue the 120 mg of cardizem, this is controlling your heart rate   Take a dose of furosemide  As soon as you get home tonight and expect to pee a lot

## 2016-03-09 NOTE — Progress Notes (Signed)
Subjective:  Patient ID: Frances Kent, female    DOB: 1930-09-08  Age: 83 y.o. MRN: [id number]  CC: Diagnoses of Other acute pulmonary embolism with acute cor pulmonale ([place]), Chronic pulmonary edema, Chronic atrial fibrillation ([place]), and Hospital discharge follow-up were pertinent to this visit.  HPI Frances Kent presents for HOSPITAL FOLLOW UP. Patient was admitted to  [facility].  chief symptoms were hacking cough and bilateral leg pain that started one week prior to admission.  She developed  resp distress on Friday and her daughter called 911.  She was extremely tachycardic.  HR was 147 by EMS  And shot up to 200 en route to  ER .  She was found to be in rapid atrial fib, with hypoxic respiratory failure requiring NIVVM.  Chest xray and CT were done and positive for intersittial edema and a small non occlusivce PE (her second,  After stopping xarelto last year after 6 months of treatment) .   She tested Positive for influenza A  And was treated with tamiflu, but she suspended it  After 2.5 days due to persistent abdominal pain  Which has now resolved. No fevers or body aches.   She was discharged on  January 21 on Cardizem 120 mg daily . With parameters to hold for systolic P 90.  Home bp readings have been on the low side but she has continued the medication.   She reports having orthopnea and has been sleepiing on a wedge/   Sense of taste is off.          Outpatient Medications Prior to Visit  Medication Sig Dispense Refill  . acidophilus (RISAQUAD) CAPS capsule Take 1 capsule by mouth daily. Reported on 04/20/2015    . bevacizumab (AVASTIN) 1.25 mg/0.1 mL SOLN by Intravitreal route every 6 (six) weeks. For macular degeneration    . Cholecalciferol (VITAMIN D3) 1000 units CAPS Take by mouth.    . diazepam (VALIUM) 5 MG tablet Take 1 tablet (5 mg total) by mouth every 12 (twelve) hours as needed for anxiety or muscle spasms. 60 tablet 1  . diltiazem (CARDIZEM CD) 120  MG 24 hr capsule Take 1 capsule (120 mg total) by mouth daily. 30 capsule 0  . pantoprazole (PROTONIX) 40 MG tablet Take 40 mg by mouth daily as needed.     . Rivaroxaban 15 & 20 MG TBPK Take as directed on package: Start with one 15mg  tablet by mouth twice a day with food. On Day 22, switch to one 20mg  tablet once a day with food. 51 each 0  . oseltamivir (TAMIFLU) 30 MG capsule Take 1 capsule (30 mg total) by mouth 2 (two) times daily. 6 capsule 0   No facility-administered medications prior to visit.     Review of Systems;  Patient denies headache, fevers, malaise, unintentional weight loss, skin rash, eye pain, sinus congestion and sinus pain, sore throat, dysphagia,  hemoptysis , cough, dyspnea, wheezing, chest pain, palpitations, orthopnea, edema, abdominal pain, nausea, melena, diarrhea, constipation, flank pain, dysuria, hematuria, urinary  Frequency, nocturia, numbness, tingling, seizures,  Focal weakness, Loss of consciousness,  Tremor, insomnia, depression, anxiety, and suicidal ideation.      Objective:  BP 110/60   Pulse 74   Temp 98.2 F (36.8 C) (Oral)   Resp 16   Ht 5\' 3"  (1.6 m)   Wt 134 lb (60.8 kg)   SpO2 97%   BMI 23.74 kg/m   BP Readings from Last 3  Encounters:  03/09/16 110/60  03/06/16 (!) 104/54  12/21/15 140/66    Wt Readings from Last 3 Encounters:  03/09/16 134 lb (60.8 kg)  03/04/16 134 lb (60.8 kg)  12/21/15 134 lb 12.8 oz (61.1 kg)    General appearance: alert, cooperative and appears stated age Ears: normal TM's and external ear canals both ears Throat: lips, mucosa, and tongue normal; teeth and gums normal Neck: no adenopathy, no carotid bruit, supple, symmetrical, trachea midline and thyroid not enlarged, symmetric, no tenderness/mass/nodules Back: symmetric, no curvature. ROM normal. No CVA tenderness. Lungs: Rales on exam midway up posterior lung fields Heart: irreg irreg , no murmur, click, rub or gallop Abdomen: soft, non-tender; bowel  sounds normal; no masses,  no organomegaly Pulses: 2+ and symmetric Skin: Skin color, texture, turgor normal. No rashes or lesions Lymph nodes: Cervical, supraclavicular, and axillary nodes normal.  No results found for: HGBA1C  Lab Results  Component Value Date   CREATININE 0.99 03/05/2016   CREATININE 1.09 (H) 03/04/2016   CREATININE 0.90 12/22/2015    Lab Results  Component Value Date   WBC 6.6 03/05/2016   HGB 12.6 03/05/2016   HCT 37.1 03/05/2016   PLT 170 03/05/2016   GLUCOSE 92 03/05/2016   CHOL 266 (H) 04/06/2015   TRIG 95.0 04/06/2015   HDL 81.60 04/06/2015   LDLDIRECT 185.3 03/27/2013   LDLCALC 166 (H) 04/06/2015   ALT 10 04/06/2015   AST 15 04/06/2015   NA 139 03/05/2016   K 3.6 03/05/2016   CL 105 03/05/2016   CREATININE 0.99 03/05/2016   BUN 16 03/05/2016   CO2 25 03/05/2016   TSH 0.947 03/04/2016   INR 1.03 03/04/2016    Ct Angio Chest Pe W Or Wo Contrast  Result Date: 03/04/2016 CLINICAL DATA:  83 year old female with shortness of breath since yesterday evening. SVT. EXAM: CT ANGIOGRAPHY CHEST WITH CONTRAST TECHNIQUE: Multidetector CT imaging of the chest was performed using the standard protocol during bolus administration of intravenous contrast. Multiplanar CT image reconstructions and MIPs were obtained to evaluate the vascular anatomy. CONTRAST:  75 mL of Isovue 370. COMPARISON:  Chest CT 12/22/2015. FINDINGS: Cardiovascular: Subsegmental sized nonocclusive filling defect noted in the right lower lobe (image 166 of series 5). No other larger central or segmental sized filling defects are noted. Heart size is mildly enlarged with left atrial dilatation. There is no significant pericardial fluid, thickening or pericardial calcification. There is aortic atherosclerosis, as well as atherosclerosis of the great vessels of the mediastinum and the coronary arteries, including calcified atherosclerotic plaque in the right coronary artery. Calcifications of the  mitral annulus. Mediastinum/Nodes: Several borderline enlarged and minimally enlarged mediastinal and bilateral hilar lymph nodes are noted measuring up to 12 mm in short axis in the low right paratracheal nodal station. Esophagus is unremarkable in appearance. No axillary lymphadenopathy. Lungs/Pleura: There is mild diffuse ground-glass attenuation and interlobular septal thickening, suggesting a background of mild interstitial pulmonary edema. Additionally, in the lower lobes of the lungs bilaterally there is more profound thickening of the peribronchovascular interstitium and more severe peribronchovascular ground-glass attenuation, micro nodularity and frank airspace consolidation, likely sequela of recent aspiration. Small bilateral pleural effusions layering dependently. Upper Abdomen: Status post cholecystectomy.  Aortic atherosclerosis. Musculoskeletal: There are no aggressive appearing lytic or blastic lesions noted in the visualized portions of the skeleton. Review of the MIP images confirms the above findings. IMPRESSION: 1. Small nonocclusive subsegmental sized pulmonary embolus in the right lower lobe. This is of uncertain clinical  significance. No larger more central or segmental sized pulmonary embolism is noted. 2. The appearance of the lower lobes of the lungs is concerning for aspiration pneumonitis/pneumonia. 3. In addition, there is evidence of congestive heart failure with mild interstitial pulmonary edema and small bilateral pleural effusions. 4. Cardiomegaly with left atrial dilatation. 5. Aortic atherosclerosis, in addition to left circumflex coronary artery disease. 6. Additional incidental findings, as above. Critical Value/emergent results were called by telephone at the time of interpretation on 03/04/2016 at 10:42 am to Nurse Marya Amsler for Dr. Delman Kitten, who verbally acknowledged these results. Electronically Signed   By: Vinnie Langton M.D.   On: 03/04/2016 10:47   US Venous Img Lower  Bilateral  Result Date: 03/04/2016 CLINICAL DATA:  Pulmonary embolism. EXAM: BILATERAL LOWER EXTREMITY VENOUS DOPPLER ULTRASOUND TECHNIQUE: Gray-scale sonography with graded compression, as well as color Doppler and duplex ultrasound were performed to evaluate the lower extremity deep venous systems from the level of the common femoral vein and including the common femoral, femoral, profunda femoral, popliteal and calf veins including the posterior tibial, peroneal and gastrocnemius veins when visible. The superficial great saphenous vein was also interrogated. Spectral Doppler was utilized to evaluate flow at rest and with distal augmentation maneuvers in the common femoral, femoral and popliteal veins. COMPARISON:  Right lower extremity venous duplex ultrasound on 12/21/2015 FINDINGS: RIGHT LOWER EXTREMITY Common Femoral Vein: No evidence of thrombus. Normal compressibility, respiratory phasicity and response to augmentation. Saphenofemoral Junction: No evidence of thrombus. Normal compressibility and flow on color Doppler imaging. Profunda Femoral Vein: No evidence of thrombus. Normal compressibility and flow on color Doppler imaging. Femoral Vein: No evidence of thrombus. Normal compressibility, respiratory phasicity and response to augmentation. Popliteal Vein: No evidence of thrombus. Normal compressibility, respiratory phasicity and response to augmentation. Calf Veins: No evidence of thrombus. Normal compressibility and flow on color Doppler imaging. Superficial Great Saphenous Vein: No evidence of thrombus. Normal compressibility and flow on color Doppler imaging. Venous Reflux:  None. Other Findings: No evidence of superficial thrombophlebitis or abnormal fluid collection. LEFT LOWER EXTREMITY Common Femoral Vein: Mild amount of nonocclusive mural thrombus in the left common femoral vein has an identical appearance to the prior ultrasound study. Saphenofemoral Junction: No evidence of thrombus. Normal  compressibility and flow on color Doppler imaging. Profunda Femoral Vein: No evidence of thrombus. Normal compressibility and flow on color Doppler imaging. Femoral Vein: No evidence of thrombus. Normal compressibility, respiratory phasicity and response to augmentation. Popliteal Vein: No evidence of thrombus. Normal compressibility, respiratory phasicity and response to augmentation. Calf Veins: No evidence of thrombus. Normal compressibility and flow on color Doppler imaging. Superficial Great Saphenous Vein: No evidence of thrombus. Normal compressibility and flow on color Doppler imaging. Venous Reflux:  None. Other Findings: No evidence of superficial thrombophlebitis or abnormal fluid collection. IMPRESSION: No evidence of acute deep venous thrombosis. Mild amount of chronic nonocclusive thrombus in the left common femoral vein appears identical to the prior ultrasound. Electronically Signed   By: Aletta Edouard M.D.   On: 03/04/2016 13:05   Dg Chest Portable 1 View  Result Date: 03/04/2016 CLINICAL DATA:  Tachycardia, hypoxia, dyspnea. History of atrial fibrillation. EXAM: PORTABLE CHEST 1 VIEW COMPARISON:  Multiple exams, including 12/22/2015 CT scan FINDINGS: Mild enlargement of the cardiopericardial silhouette with cephalization of blood flow and faint interstitial accentuation. Atherosclerotic aortic arch. Prominence of the central pulmonary vasculature. New 9 mm apical left lung lesion, appears to be different from the prior pleuroparenchymal scarring in this region,  and not readily seen on prior chest radiographs. Mild left lower lobe airspace opacity with indistinctness of the left posterior hemidiaphragm. IMPRESSION: 1. Possible 9 mm left apical lung nodule common new compared to prior exams. Chest CT or close follow up chest radiography recommended. 2. Mild enlargement of the cardiopericardial silhouette with cephalization of blood flow and mild interstitial accentuation, suspicious for mild  interstitial edema. 3.  Atherosclerotic calcification of the aortic arch. 4. Linear opacities in the retrocardiac region with indistinctness of the left hemidiaphragm, possibly from confluent edema or bronchopneumonia. 5. Prominent central pulmonary vasculature likely from pulmonary arterial hypertension. Electronically Signed   By: Van Clines M.D.   On: 03/04/2016 08:33    Assessment & Plan:   Problem List Items Addressed This Visit    Atrial fibrillation ([place])    continue cardizem , but given her low BP she may require cardioversion .  Sees [employer]	[place].  On Xarelto since Jan 20       Relevant Medications   furosemide (LASIX) 20 MG tablet   Chronic pulmonary edema    With orthopnea reported .  Resume furosemide 20 mg daily       Hospital discharge follow-up    All labs , imaging studies and progress notes from admission were reviewed with patinet today Patient is stable post discharge and has no new issues or questions about discharge plans at the visit today for hospital follow up.       Pulmonary embolism ([facility])    Recurrent ,. Unprovoked.  She has resumed Xarelto      Relevant Medications   furosemide (LASIX) 20 MG tablet      I have discontinued Ms. Deveny's oseltamivir. I am also having her start on furosemide. Additionally, I am having her maintain her pantoprazole, acidophilus, diazepam, bevacizumab, Vitamin D3, diltiazem, and Rivaroxaban.  Meds ordered this encounter  Medications  . furosemide (LASIX) 20 MG tablet    Sig: Take 1 tablet (20 mg total) by mouth daily.    Dispense:  30 tablet    Refill:  0    Medications Discontinued During This Encounter  Medication Reason  . oseltamivir (TAMIFLU) 30 MG capsule No longer needed (for PRN medications)    Follow-up: No Follow-up on file.   [name], MD

## 2016-03-10 ENCOUNTER — Telehealth: Payer: Self-pay | Admitting: Internal Medicine

## 2016-03-10 DIAGNOSIS — Z09 Encounter for follow-up examination after completed treatment for conditions other than malignant neoplasm: Secondary | ICD-10-CM | POA: Insufficient documentation

## 2016-03-10 DIAGNOSIS — J811 Chronic pulmonary edema: Secondary | ICD-10-CM | POA: Insufficient documentation

## 2016-03-10 NOTE — Telephone Encounter (Signed)
Pt daughter called and stated that they are do not have an appointment with the Cardiologist until next week. Pt started medication last night for the fluid, and only pt only went to the bathroom one time last night. Daughter would like to know if she should continue this medication. Please advise, thank you!  Call pt @ [phone] ext [phone] (daughter Silva Bandy)  [phone] (pt)

## 2016-03-10 NOTE — Assessment & Plan Note (Signed)
All labs , imaging studies and progress notes from admission were reviewed with patinet today Patient is stable post discharge and has no new issues or questions about discharge plans at the visit today for hospital follow up.

## 2016-03-10 NOTE — Assessment & Plan Note (Addendum)
continue cardizem , but given her low BP she may require cardioversion .  Sees [employer]	[place].  On Xarelto since Jan 20

## 2016-03-10 NOTE — Assessment & Plan Note (Signed)
Recurrent ,. Unprovoked.  She has resumed Xarelto

## 2016-03-10 NOTE — Assessment & Plan Note (Signed)
With orthopnea reported .  Resume furosemide 20 mg daily

## 2016-03-11 NOTE — Telephone Encounter (Signed)
YES TAKE ANOTHER DOSE TODAY ONLY. WEIGH HERSELF DAILY.  IF WT GOES UP BY 2 LBS OVERNIGHT,  TAKE ANOTHER DOSE.

## 2016-03-11 NOTE — Telephone Encounter (Signed)
Notified [name] (phyllis)

## 2016-03-16 DIAGNOSIS — I2699 Other pulmonary embolism without acute cor pulmonale: Secondary | ICD-10-CM | POA: Diagnosis not present

## 2016-03-16 DIAGNOSIS — I480 Paroxysmal atrial fibrillation: Secondary | ICD-10-CM | POA: Diagnosis not present

## 2016-03-16 DIAGNOSIS — R0602 Shortness of breath: Secondary | ICD-10-CM | POA: Diagnosis not present

## 2016-03-16 DIAGNOSIS — I5033 Acute on chronic diastolic (congestive) heart failure: Secondary | ICD-10-CM | POA: Diagnosis not present

## 2016-03-24 DIAGNOSIS — I340 Nonrheumatic mitral (valve) insufficiency: Secondary | ICD-10-CM | POA: Diagnosis not present

## 2016-03-24 DIAGNOSIS — I10 Essential (primary) hypertension: Secondary | ICD-10-CM | POA: Diagnosis not present

## 2016-03-24 DIAGNOSIS — I480 Paroxysmal atrial fibrillation: Secondary | ICD-10-CM | POA: Diagnosis not present

## 2016-03-25 DIAGNOSIS — I480 Paroxysmal atrial fibrillation: Secondary | ICD-10-CM | POA: Diagnosis not present

## 2016-03-31 DIAGNOSIS — Z79899 Other long term (current) drug therapy: Secondary | ICD-10-CM | POA: Diagnosis not present

## 2016-04-02 ENCOUNTER — Encounter: Payer: Self-pay | Admitting: Emergency Medicine

## 2016-04-02 ENCOUNTER — Emergency Department: Payer: Medicare HMO

## 2016-04-02 ENCOUNTER — Emergency Department
Admission: EM | Admit: 2016-04-02 | Discharge: 2016-04-02 | Disposition: A | Discharge status: Home / Self Care | Payer: Medicare HMO | Source: Home / Self Care | Attending: Emergency Medicine | Admitting: Emergency Medicine

## 2016-04-02 DIAGNOSIS — Z79899 Other long term (current) drug therapy: Secondary | ICD-10-CM

## 2016-04-02 DIAGNOSIS — R0602 Shortness of breath: Secondary | ICD-10-CM | POA: Insufficient documentation

## 2016-04-02 DIAGNOSIS — I480 Paroxysmal atrial fibrillation: Secondary | ICD-10-CM

## 2016-04-02 DIAGNOSIS — I4891 Unspecified atrial fibrillation: Secondary | ICD-10-CM

## 2016-04-02 DIAGNOSIS — R05 Cough: Secondary | ICD-10-CM | POA: Diagnosis not present

## 2016-04-02 LAB — CBC
HEMATOCRIT: 37.7 % (ref 35.0–47.0)
Hemoglobin: 12.6 g/dL (ref 12.0–16.0)
MCH: 29.9 pg (ref 26.0–34.0)
MCHC: 33.4 g/dL (ref 32.0–36.0)
MCV: 89.5 fL (ref 80.0–100.0)
Platelets: 258 10*3/uL (ref 150–440)
RBC: 4.21 MIL/uL (ref 3.80–5.20)
RDW: 15.2 % — AB (ref 11.5–14.5)
WBC: 6.8 10*3/uL (ref 3.6–11.0)

## 2016-04-02 LAB — BASIC METABOLIC PANEL
Anion gap: 8 (ref 5–15)
BUN: 11 mg/dL (ref 6–20)
CHLORIDE: 108 mmol/L (ref 101–111)
CO2: 24 mmol/L (ref 22–32)
Calcium: 9.2 mg/dL (ref 8.9–10.3)
Creatinine, Ser: 1 mg/dL (ref 0.44–1.00)
GFR calc Af Amer: 59 mL/min — ABNORMAL LOW (ref >60)
GFR calc non Af Amer: 51 mL/min — ABNORMAL LOW (ref >60)
GLUCOSE: 105 mg/dL — AB (ref 65–99)
POTASSIUM: 3.8 mmol/L (ref 3.5–5.1)
Sodium: 140 mmol/L (ref 135–145)

## 2016-04-02 LAB — TROPONIN I: Troponin I: <0.03 ng/mL (ref <0.03)

## 2016-04-02 LAB — BRAIN NATRIURETIC PEPTIDE: B Natriuretic Peptide: 347 pg/mL — ABNORMAL HIGH (ref 0.0–100.0)

## 2016-04-02 MED ORDER — METOPROLOL TARTRATE 5 MG/5ML IV SOLN
2.5000 mg | Freq: Once | INTRAVENOUS | Status: AC
  Administered 2016-04-02: 2.5 mg via INTRAVENOUS
  Filled 2016-04-02: qty 5

## 2016-04-02 NOTE — ED Notes (Signed)
BLUE TOP TUBE SENT TO LAB WITH TEMP LABEL IF NEEDED

## 2016-04-02 NOTE — Discharge Instructions (Signed)
Please seek medical attention for any high fevers, chest pain, shortness of breath, change in behavior, persistent vomiting, bloody stool or any other new or concerning symptoms.  

## 2016-04-02 NOTE — ED Provider Notes (Signed)
[facility] Emergency Department Provider Note ____________________________________________   I have reviewed the triage vital signs and the triage nursing note.  HISTORY  Chief Complaint Atrial Fibrillation and Cough   Historian Patient and daughter  HPI Frances Kent is a 83 y.o. female with a history of atrial fibrillation, and recent diagnosis of PE, placed on Xarelto, and has been on diltiazem for about a month, presents with shortness of breath today. She came in for further evaluation. Mild nonproductive cough. No fever. No chest pain. She has had some heart racing ever since diagnosis in mid January. She says that when she checks her heart rate it has been 90 or 102. Denies dizziness or passing out. Family believes that her symptomatic shortness of breath started when her heart started significantly racing.  She recently saw the mid-level practitioner at her cardiologist office, Dr. [name], and was told she was going to be switching starting today to metoprolol instead of diltiazem to decrease bleeding risk in associated with the blood that she is on Xarelto.    Past Medical History:  Diagnosis Date  . Atrial fibrillation ([place])   . GERD (gastroesophageal reflux disease)   . IPF (idiopathic pulmonary fibrosis) ([place]) [address]11   by CT, PFTS done by Humphrey Rolls  . Mitral regurgitation   . Pulmonary embolism [facility])     Patient Active Problem List   Diagnosis Date Noted  . Chronic pulmonary edema 03/10/2016  . Hospital discharge follow-up 03/10/2016  . SOB (shortness of breath) 03/04/2016  . Right ankle swelling 12/21/2015  . Vitamin D deficiency 04/08/2015  . Cervical spine degeneration 12/13/2014  . Pulmonary embolism ([place]) 12/02/2014  . Insomnia 10/04/2014  . Atrial fibrillation ([place]) 04/03/2014  . Skin lesion of face 12/21/2013  . Achilles tendinosis 12/21/2013  . Generalized anxiety disorder 03/28/2013  . Mitral valve prolapse 03/28/2013  .  Encounter for preventive health examination 03/23/2012  . IPF (idiopathic pulmonary fibrosis) ([place])   . Screening for breast cancer 11/29/2010  . Macular degeneration, left eye 11/29/2010  . Screening for colon cancer 11/29/2010  . Hyperlipidemia LDL goal <160 11/29/2010  . GERD (gastroesophageal reflux disease)     Past Surgical History:  Procedure Laterality Date  . APPENDECTOMY  1960  . CHOLECYSTECTOMY  1985  . OVARIAN CYST REMOVAL      Prior to Admission medications   Medication Sig Start Date End Date Taking? Authorizing Provider  acidophilus (RISAQUAD) CAPS capsule Take 1 capsule by mouth daily. Reported on 04/20/2015    Historical Provider, MD  bevacizumab (AVASTIN) 1.25 mg/0.1 mL SOLN by Intravitreal route every 6 (six) weeks. For macular degeneration    Historical Provider, MD  Cholecalciferol (VITAMIN D3) 1000 units CAPS Take by mouth.    Historical Provider, MD  diazepam (VALIUM) 5 MG tablet Take 1 tablet (5 mg total) by mouth every 12 (twelve) hours as needed for anxiety or muscle spasms. 12/11/14   [name], MD  diltiazem (CARDIZEM CD) 120 MG 24 hr capsule Take 1 capsule (120 mg total) by mouth daily. 03/06/16   Vaughan Basta, MD  furosemide (LASIX) 20 MG tablet Take 1 tablet (20 mg total) by mouth daily. 03/09/16   [name], MD  pantoprazole (PROTONIX) 40 MG tablet Take 40 mg by mouth daily as needed.     Historical Provider, MD  Rivaroxaban 15 & 20 MG TBPK Take as directed on package: Start with one 15mg  tablet by mouth twice a day with food. On Day  22, switch to one 20mg  tablet once a day with food. 03/06/16   Vaughan Basta, MD    No Known Allergies  Family History  Problem Relation Age of Onset  . Cancer Mother 60    breast cancer, lived to 42,   . Cancer Son 43    pancreatic cancer  . Heart disease Son     CAD, Tobacco Abuse   . Heart disease Maternal Grandmother 80    died of massive MI    Social History Social History  Substance  Use Topics  . Smoking status: Never Smoker  . Smokeless tobacco: Never Used     Comment: 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 , worked at [employer], [employer]  . Alcohol use No    Review of Systems  Constitutional: Negative for fever. Eyes: Negative for visual changes. ENT: Negative for sore throat. Cardiovascular: Negative for chest pain. Respiratory: Positive for shortness of breath. Gastrointestinal: Negative for abdominal pain, vomiting and diarrhea. Genitourinary: Negative for dysuria. Musculoskeletal: Negative for back pain. Skin: Negative for rash. Neurological: Negative for headache. 10 point Review of Systems otherwise negative ____________________________________________   PHYSICAL EXAM:  VITAL SIGNS: ED Triage Vitals [04/02/16 1156]  Enc Vitals Group     BP (!) 126/105     Pulse Rate (!) 156     Resp 20     Temp 98.3 F (36.8 C)     Temp Source Oral     SpO2 97 %     Weight 132 lb (59.9 kg)     Height 5\' 3"  (1.6 m)     Head Circumference      Peak Flow      Pain Score 7     Pain Loc      Pain Edu?      Excl. in [place]?      Constitutional: Alert and oriented. Well appearing and in no distress. HEENT   Head: Normocephalic and atraumatic.      Eyes: Conjunctivae are normal. PERRL. Normal extraocular movements.      Ears:         Nose: No congestion/rhinnorhea.   Mouth/Throat: Mucous membranes are moist.   Neck: No stridor. Cardiovascular/Chest:Tachycardic and irregularly irregular rhythm.  No murmurs, rubs, or gallops. Respiratory: Normal respiratory effort without tachypnea nor retractions. Breath sounds are clear and equal bilaterally. No wheezes/rales/rhonchi. Gastrointestinal: Soft. No distention, no guarding, no rebound. Nontender.    Genitourinary/rectal:Deferred Musculoskeletal: Nontender with normal range of motion in all extremities. No joint effusions.  No lower extremity tenderness.  No edema. Neurologic:  Normal speech and language. No gross or focal  neurologic deficits are appreciated. Skin:  Skin is warm, dry and intact. No rash noted. Psychiatric: Mood and affect are normal. Speech and behavior are normal. Patient exhibits appropriate insight and judgment.   ____________________________________________  LABS (pertinent positives/negatives)  Labs Reviewed  BASIC METABOLIC PANEL - Abnormal; Notable for the following:       Result Value   Glucose, Bld 105 (*)    GFR calc non Af Amer 51 (*)    GFR calc Af Amer 59 (*)    All other components within normal limits  CBC - Abnormal; Notable for the following:    RDW 15.2 (*)    All other components within normal limits  TROPONIN I  BRAIN NATRIURETIC PEPTIDE    ____________________________________________    EKG I, Lisa Roca, MD, the attending physician have personally viewed and interpreted all ECGs.  140 bpm. Atrial fibrillation with rapid  ventricular response. Left axis deviation. Include right bundle branch block. Nonspecific ST and T-wave ____________________________________________  RADIOLOGY All Xrays were viewed by me. Imaging interpreted by Radiologist.  Chest two-view:  IMPRESSION: Emphysematous changes with chronic interstitial disease and bibasilar atelectasis.  Enlargement of cardiac silhouette with pulmonary vascular congestion.  No definite acute infiltrate. __________________________________________  PROCEDURES  Procedure(s) performed: None  Critical Care performed: None  ____________________________________________   ED COURSE / ASSESSMENT AND PLAN  Pertinent labs & imaging results that were available during my care of the patient were reviewed by me and considered in my medical decision making (see chart for details).   Mrs. Lippincott is here for dyspnea that sounds like followed fast heart rate rather than the other way around. No productive cough or hypoxia or hypotension. No flulike symptoms. No abdominal pain.  We'll treat here in  the emergency department, heart rate between 128 and 140.  Chest x-ray shows possible edema, no focal infiltrate.  Will add BNP. It does not appear to me that there was a recent echo after the recent hospitalization where she was found to have a small PE and A. fib.  I'm going to try metoprolol as she was planned to be moved over to metoprolol today.  Patient care transferred to Dr. Archie Balboa at shift change 3 PM.  BNP is pending.  Patient receiving IV dose metoprolol, disposition per response to rate control attempts in ED.   CONSULTATIONS:   None   Patient / Family / Caregiver informed of clinical course, medical decision-making process, and agree with plan.   ___________________________________________   FINAL CLINICAL IMPRESSION(S) / ED DIAGNOSES   Final diagnoses:  Atrial fibrillation with rapid ventricular response ([place])              Note: This dictation was prepared with Dragon dictation. Any transcriptional errors that result from this process are unintentional    Lisa Roca, MD 04/02/16 1454

## 2016-04-02 NOTE — ED Notes (Signed)
Pt ambulated up and down ER hallway, able to carry on full conversation without SOB. Pt back in bed at this time. HR initially 120's but back down to 105 in less than one minute from sitting down.

## 2016-04-02 NOTE — ED Notes (Signed)
ED Provider at bedside. 

## 2016-04-02 NOTE — ED Triage Notes (Signed)
Pt reports she has been in afib for the past month, along with cough that has persisted. Pt states she was in the hospital a month ago for a PE and afib, started on Xarelto and Cardizem, follow up with Dr. [name].  Pt states she is short of breath when walking long distances. Worried about fluid on her lungs.

## 2016-04-02 NOTE — ED Notes (Signed)
Report from Manuela Schwartz, [place]. Care assumed by this RN.

## 2016-04-02 NOTE — ED Notes (Addendum)
Pt has been in afib per pt since last admission. Take zyrelto and cardizem. Also just filled new rx for metoprolol that was prescribed to help her rate but has not started the medication yet. Has f/u appt on Monday. C/o of cough, nonproductive. Hx of ideopathic pulmonary fibrosis

## 2016-04-02 NOTE — ED Notes (Signed)
Pt alert and oriented X4, active, cooperative, pt in NAD. RR even and unlabored, color WNL.  Pt informed to return if any life threatening symptoms occur.   

## 2016-04-04 ENCOUNTER — Emergency Department: Payer: Medicare HMO

## 2016-04-04 ENCOUNTER — Encounter: Payer: Self-pay | Admitting: *Deleted

## 2016-04-04 ENCOUNTER — Ambulatory Visit (INDEPENDENT_AMBULATORY_CARE_PROVIDER_SITE_OTHER): Payer: Medicare HMO | Admitting: Internal Medicine

## 2016-04-04 ENCOUNTER — Inpatient Hospital Stay
Admission: EM | Admit: 2016-04-04 | Discharge: 2016-04-06 | DRG: 308 | Disposition: A | Discharge status: Home / Self Care | Payer: Medicare HMO | Attending: Internal Medicine | Admitting: Internal Medicine

## 2016-04-04 DIAGNOSIS — R5383 Other fatigue: Secondary | ICD-10-CM | POA: Diagnosis not present

## 2016-04-04 DIAGNOSIS — I5031 Acute diastolic (congestive) heart failure: Secondary | ICD-10-CM

## 2016-04-04 DIAGNOSIS — I2699 Other pulmonary embolism without acute cor pulmonale: Secondary | ICD-10-CM | POA: Diagnosis not present

## 2016-04-04 DIAGNOSIS — I509 Heart failure, unspecified: Secondary | ICD-10-CM | POA: Diagnosis not present

## 2016-04-04 DIAGNOSIS — Z9049 Acquired absence of other specified parts of digestive tract: Secondary | ICD-10-CM | POA: Diagnosis not present

## 2016-04-04 DIAGNOSIS — J9601 Acute respiratory failure with hypoxia: Secondary | ICD-10-CM | POA: Diagnosis not present

## 2016-04-04 DIAGNOSIS — Z800 Family history of malignant neoplasm of digestive organs: Secondary | ICD-10-CM | POA: Diagnosis not present

## 2016-04-04 DIAGNOSIS — Z7901 Long term (current) use of anticoagulants: Secondary | ICD-10-CM | POA: Diagnosis not present

## 2016-04-04 DIAGNOSIS — R531 Weakness: Secondary | ICD-10-CM | POA: Diagnosis not present

## 2016-04-04 DIAGNOSIS — I110 Hypertensive heart disease with heart failure: Secondary | ICD-10-CM | POA: Diagnosis present

## 2016-04-04 DIAGNOSIS — Z8249 Family history of ischemic heart disease and other diseases of the circulatory system: Secondary | ICD-10-CM

## 2016-04-04 DIAGNOSIS — I481 Persistent atrial fibrillation: Secondary | ICD-10-CM

## 2016-04-04 DIAGNOSIS — I482 Chronic atrial fibrillation: Secondary | ICD-10-CM | POA: Diagnosis not present

## 2016-04-04 DIAGNOSIS — Z803 Family history of malignant neoplasm of breast: Secondary | ICD-10-CM

## 2016-04-04 DIAGNOSIS — K219 Gastro-esophageal reflux disease without esophagitis: Secondary | ICD-10-CM | POA: Diagnosis not present

## 2016-04-04 DIAGNOSIS — I959 Hypotension, unspecified: Secondary | ICD-10-CM | POA: Diagnosis not present

## 2016-04-04 DIAGNOSIS — I4892 Unspecified atrial flutter: Secondary | ICD-10-CM | POA: Diagnosis not present

## 2016-04-04 DIAGNOSIS — Z79899 Other long term (current) drug therapy: Secondary | ICD-10-CM

## 2016-04-04 DIAGNOSIS — Z86711 Personal history of pulmonary embolism: Secondary | ICD-10-CM

## 2016-04-04 DIAGNOSIS — I340 Nonrheumatic mitral (valve) insufficiency: Secondary | ICD-10-CM | POA: Diagnosis not present

## 2016-04-04 DIAGNOSIS — I499 Cardiac arrhythmia, unspecified: Secondary | ICD-10-CM | POA: Diagnosis present

## 2016-04-04 DIAGNOSIS — I4891 Unspecified atrial fibrillation: Secondary | ICD-10-CM | POA: Diagnosis not present

## 2016-04-04 DIAGNOSIS — J439 Emphysema, unspecified: Secondary | ICD-10-CM | POA: Diagnosis present

## 2016-04-04 DIAGNOSIS — J84112 Idiopathic pulmonary fibrosis: Secondary | ICD-10-CM | POA: Diagnosis present

## 2016-04-04 DIAGNOSIS — I4819 Other persistent atrial fibrillation: Secondary | ICD-10-CM

## 2016-04-04 DIAGNOSIS — F488 Other specified nonpsychotic mental disorders: Secondary | ICD-10-CM | POA: Diagnosis not present

## 2016-04-04 LAB — CBC
HCT: 37.1 % (ref 35.0–47.0)
HEMOGLOBIN: 12 g/dL (ref 12.0–16.0)
MCH: 29.4 pg (ref 26.0–34.0)
MCHC: 32.5 g/dL (ref 32.0–36.0)
MCV: 90.5 fL (ref 80.0–100.0)
Platelets: 250 10*3/uL (ref 150–440)
RBC: 4.1 MIL/uL (ref 3.80–5.20)
RDW: 15.2 % — ABNORMAL HIGH (ref 11.5–14.5)
WBC: 6.5 10*3/uL (ref 3.6–11.0)

## 2016-04-04 LAB — BASIC METABOLIC PANEL
Anion gap: 8 (ref 5–15)
BUN: 16 mg/dL (ref 6–20)
CHLORIDE: 110 mmol/L (ref 101–111)
CO2: 23 mmol/L (ref 22–32)
CREATININE: 0.9 mg/dL (ref 0.44–1.00)
Calcium: 9 mg/dL (ref 8.9–10.3)
GFR calc Af Amer: >60 mL/min (ref >60)
GFR calc non Af Amer: 58 mL/min — ABNORMAL LOW (ref >60)
GLUCOSE: 99 mg/dL (ref 65–99)
POTASSIUM: 4.3 mmol/L (ref 3.5–5.1)
Sodium: 141 mmol/L (ref 135–145)

## 2016-04-04 LAB — TROPONIN I: Troponin I: <0.03 ng/mL (ref <0.03)

## 2016-04-04 LAB — BRAIN NATRIURETIC PEPTIDE: B Natriuretic Peptide: 590 pg/mL — ABNORMAL HIGH (ref 0.0–100.0)

## 2016-04-04 MED ORDER — ACETAMINOPHEN 650 MG RE SUPP
650.0000 mg | Freq: Four times a day (QID) | RECTAL | Status: DC | PRN
Start: 2016-04-04 — End: 2016-04-06

## 2016-04-04 MED ORDER — DIAZEPAM 5 MG PO TABS: 5.0000 mg | ORAL_TABLET | Freq: Two times a day (BID) | ORAL | Status: DC | PRN

## 2016-04-04 MED ORDER — RISAQUAD PO CAPS
1.0000 | ORAL_CAPSULE | Freq: Every day | ORAL | Status: DC
  Administered 2016-04-06: 1 via ORAL
  Filled 2016-04-04 (×2): qty 1

## 2016-04-04 MED ORDER — TRAZODONE HCL 50 MG PO TABS: 25.0000 mg | ORAL_TABLET | Freq: Every evening | ORAL | Status: DC | PRN

## 2016-04-04 MED ORDER — METOPROLOL TARTRATE 25 MG PO TABS
25.0000 mg | ORAL_TABLET | Freq: Two times a day (BID) | ORAL | Status: DC
  Administered 2016-04-04 – 2016-04-06 (×4): 25 mg via ORAL
  Filled 2016-04-04 (×4): qty 1

## 2016-04-04 MED ORDER — ONDANSETRON HCL 4 MG/2ML IJ SOLN: 4.0000 mg | Freq: Four times a day (QID) | INTRAMUSCULAR | Status: DC | PRN

## 2016-04-04 MED ORDER — PANTOPRAZOLE SODIUM 40 MG PO TBEC
40.0000 mg | DELAYED_RELEASE_TABLET | Freq: Every day | ORAL | Status: DC
  Administered 2016-04-04 – 2016-04-06 (×2): 40 mg via ORAL
  Filled 2016-04-04 (×3): qty 1

## 2016-04-04 MED ORDER — FUROSEMIDE 20 MG PO TABS
20.0000 mg | ORAL_TABLET | Freq: Every day | ORAL | Status: DC
  Administered 2016-04-04: 20 mg via ORAL
  Filled 2016-04-04: qty 1
  Filled 2016-04-04: qty 2

## 2016-04-04 MED ORDER — HEPARIN SODIUM (PORCINE) 5000 UNIT/ML IJ SOLN: 5000.0000 [IU] | Freq: Three times a day (TID) | INTRAMUSCULAR | Status: DC

## 2016-04-04 MED ORDER — DILTIAZEM HCL ER COATED BEADS 120 MG PO CP24
120.0000 mg | ORAL_CAPSULE | Freq: Every day | ORAL | Status: DC
  Administered 2016-04-04 – 2016-04-05 (×2): 120 mg via ORAL
  Filled 2016-04-04 (×2): qty 1

## 2016-04-04 MED ORDER — ONDANSETRON HCL 4 MG PO TABS: 4.0000 mg | ORAL_TABLET | Freq: Four times a day (QID) | ORAL | Status: DC | PRN

## 2016-04-04 MED ORDER — RIVAROXABAN 20 MG PO TABS: 20.0000 mg | ORAL_TABLET | Freq: Every day | ORAL | Status: DC

## 2016-04-04 MED ORDER — METOPROLOL TARTRATE 5 MG/5ML IV SOLN
2.5000 mg | Freq: Once | INTRAVENOUS | Status: AC
  Administered 2016-04-04: 2.5 mg via INTRAVENOUS
  Filled 2016-04-04: qty 5

## 2016-04-04 MED ORDER — METOPROLOL TARTRATE 5 MG/5ML IV SOLN
2.5000 mg | Freq: Once | INTRAVENOUS | Status: AC
  Administered 2016-04-04: 2.5 mg via INTRAVENOUS

## 2016-04-04 MED ORDER — ACETAMINOPHEN 325 MG PO TABS: 650.0000 mg | ORAL_TABLET | Freq: Four times a day (QID) | ORAL | Status: DC | PRN

## 2016-04-04 MED ORDER — FUROSEMIDE 10 MG/ML IJ SOLN
20.0000 mg | Freq: Once | INTRAMUSCULAR | Status: AC
  Administered 2016-04-04: 20 mg via INTRAVENOUS
  Filled 2016-04-04: qty 4

## 2016-04-04 MED ORDER — BISACODYL 5 MG PO TBEC
5.0000 mg | DELAYED_RELEASE_TABLET | Freq: Every day | ORAL | Status: DC | PRN
  Filled 2016-04-04: qty 1

## 2016-04-04 MED ORDER — ORAL CARE MOUTH RINSE: 15.0000 mL | Freq: Two times a day (BID) | OROMUCOSAL | Status: DC

## 2016-04-04 MED ORDER — DOCUSATE SODIUM 100 MG PO CAPS
100.0000 mg | ORAL_CAPSULE | Freq: Two times a day (BID) | ORAL | Status: DC
  Administered 2016-04-06: 100 mg via ORAL
  Filled 2016-04-04 (×3): qty 1

## 2016-04-04 NOTE — ED Notes (Signed)
Report to floor, await tech for transport to room 249

## 2016-04-04 NOTE — Progress Notes (Signed)
Patient admitted on 2 L O2 via nasal cannula. Patient ambulated to the bathroom on room air independently with supervision by this nurse. Patient tolerated ambulation well. Replaced 2 L back on patient for comfort, some mild complaints of dyspnea on exertion. Patient now resting comfortably in bed, daughter at the bedside.

## 2016-04-04 NOTE — H&P (Signed)
[facility] NAME: Frances Kent    MR#:  [id number]  DATE OF BIRTH:  [birth year]  DATE OF ADMISSION:  04/04/2016  PRIMARY CARE PHYSICIAN: [name], MD   REQUESTING/REFERRING PHYSICIAN: Dr.caolina varonese  CHIEF COMPLAINT:   Chief Complaint  Patient presents with  . Irregular Heart Beat    HISTORY OF PRESENT ILLNESS:  Frances Kent  is a 83 y.o. female with Essential hypertension, chronic atrial fibrillation, pulmonary emboli , see primary doctor today because of dizziness, tiredness, found to have hypoxia with O2 sats 88% on room air and she was sent to emergency room because of that.pt is having  shortness of breath, fatigue  for almost one week, patient feels really tired and feels like she has no energy. Diagnosed with atrial fibrillatio,  last month, admitted last month for that, started on Cardizem, metoprolol, Xarelto, patient continued to feel weak, tired, in fact patient was here 2 days ago for similar symptoms and was discharged from emergency room. Patient told me that Dr. [name] increase the dose of metoprolol on Friday but she still feels really tired, dizzy, when she came, he was in A. fib with RVR heart rate 1 43 bpm. She received a metoprolol 7.5 mg IV so far, Lasix 20 mg IV, Dr. [name] recommended admission for possible cardioversion tomorrow.  PAST MEDICAL HISTORY:   Past Medical History:  Diagnosis Date  . Atrial fibrillation ([place])   . GERD (gastroesophageal reflux disease)   . IPF (idiopathic pulmonary fibrosis) ([place]) 20111   by CT, PFTS done by Humphrey Rolls  . Mitral regurgitation   . Pulmonary embolism ([place])     PAST SURGICAL HISTOIRY:   Past Surgical History:  Procedure Laterality Date  . APPENDECTOMY  1960  . CHOLECYSTECTOMY  1985  . OVARIAN CYST REMOVAL      SOCIAL HISTORY:   Social History  Substance Use Topics  . Smoking status: Never Smoker  . Smokeless tobacco: Never Used      Comment: 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 , worked at [employer], [employer]  . Alcohol use No    FAMILY HISTORY:   Family History  Problem Relation Age of Onset  . Cancer Mother 58    breast cancer, lived to 65,   . Cancer Son 2    pancreatic cancer  . Heart disease Son     CAD, Tobacco Abuse   . Heart disease Maternal Grandmother 80    died of massive MI    DRUG ALLERGIES:  No Known Allergies  REVIEW OF SYSTEMS:  CONSTITUTIONAL: No fever,but has fatigue, EYES: No blurred or double vision.  EARS, NOSE, AND THROAT: No tinnitus or ear pain.  RESPIRATORY:  Has dry  cough, shortness of breath,  No wheezing or hemoptysis.  CARDIOVASCULAR: No chest pain, orthopnea, edema.  GASTROINTESTINAL: No nausea, vomiting, diarrhea or abdominal pain.  GENITOURINARY: No dysuria, hematuria.  ENDOCRINE: No polyuria, nocturia,  HEMATOLOGY: No anemia, easy bruising or bleeding SKIN: No rash or lesion. MUSCULOSKELETAL: No joint pain or arthritis.   NEUROLOGIC: No tingling, numbness, weakness.  PSYCHIATRY: No anxiety or depression.   MEDICATIONS AT HOME:   Prior to Admission medications   Medication Sig Start Date End Date Taking? Authorizing Provider  acidophilus (RISAQUAD) CAPS capsule Take 1 capsule by mouth daily. Reported on 04/20/2015   Yes Historical Provider, MD  Cholecalciferol (VITAMIN D3) 1000 units CAPS Take by mouth.   Yes Historical Provider, MD  metoprolol tartrate (LOPRESSOR)  25 MG tablet Take 25 mg by mouth 2 (two) times daily.   Yes Historical Provider, MD  pantoprazole (PROTONIX) 40 MG tablet Take 40 mg by mouth daily as needed.    Yes Historical Provider, MD  rivaroxaban (XARELTO) 20 MG TABS tablet Take 20 mg by mouth daily.   Yes Historical Provider, MD  Rivaroxaban 15 & 20 MG TBPK Take as directed on package: Start with one 15mg  tablet by mouth twice a day with food. On Day 22, switch to one 20mg  tablet once a day with food. 03/06/16  Yes Vaughan Basta, MD  bevacizumab (AVASTIN)  1.25 mg/0.1 mL SOLN by Intravitreal route every 6 (six) weeks. For macular degeneration    Historical Provider, MD  diazepam (VALIUM) 5 MG tablet Take 1 tablet (5 mg total) by mouth every 12 (twelve) hours as needed for anxiety or muscle spasms. Patient not taking: Reported on 04/04/2016 12/11/14   [name], MD  diltiazem (CARDIZEM CD) 120 MG 24 hr capsule Take 1 capsule (120 mg total) by mouth daily. Patient not taking: Reported on 04/04/2016 03/06/16   Vaughan Basta, MD  furosemide (LASIX) 20 MG tablet Take 1 tablet (20 mg total) by mouth daily. Patient not taking: Reported on 04/04/2016 03/09/16   [name], MD      VITAL SIGNS:  Blood pressure 114/81, pulse (!) 110, temperature 97.6 F (36.4 C), temperature source Oral, resp. rate (!) 28, height 5\' 3"  (1.6 m), weight 58.5 kg (129 lb), SpO2 97 %.  PHYSICAL EXAMINATION:  GENERAL:  83 y.o.-year-old patient lying in the bed with no acute distress.  EYES: Pupils equal, round, reactive to light and accommodation. No scleral icterus. Extraocular muscles intact.  HEENT: Head atraumatic, normocephalic. Oropharynx and nasopharynx clear.  NECK:  Supple, no jugular venous distention. No thyroid enlargement, no tenderness.  LUNGS: Normal breath sounds bilaterally, no wheezing, rales,rhonchi or crepitation. No use of accessory muscles of respiration.  CARDIOVASCULAR: S1, S2 normal. No murmurs, rubs, or gallops.  ABDOMEN: Soft, nontender, nondistended. Bowel sounds present. No organomegaly or mass.  EXTREMITIES: No pedal edema, cyanosis, or clubbing.  NEUROLOGIC: Cranial nerves II through XII are intact. Muscle strength 5/5 in all extremities. Sensation intact. Gait not checked.  PSYCHIATRIC: The patient is alert and oriented x 3.  SKIN: No obvious rash, lesion, or ulcer.   LABORATORY PANEL:   CBC  Recent Labs Lab 04/04/16 1117  WBC 6.5  HGB 12.0  HCT 37.1  PLT 250    ------------------------------------------------------------------------------------------------------------------  Chemistries   Recent Labs Lab 04/04/16 1117  NA 141  K 4.3  CL 110  CO2 23  GLUCOSE 99  BUN 16  CREATININE 0.90  CALCIUM 9.0   ------------------------------------------------------------------------------------------------------------------  Cardiac Enzymes  Recent Labs Lab 04/04/16 1117  TROPONINI <0.03   ------------------------------------------------------------------------------------------------------------------  RADIOLOGY:  Dg Chest 2 View  Result Date: 04/04/2016 CLINICAL DATA:  Atrial fibrillation, weakness EXAM: CHEST  2 VIEW COMPARISON:  04/02/2016 FINDINGS: Cardiomegaly again noted. Stable hyperinflation and probable chronic mild interstitial prominence bilaterally. Stable bilateral basilar atelectasis or scarring. Osteopenia and mild degenerative changes thoracic spine. No superimposed infiltrate or pulmonary edema. IMPRESSION: No active disease. Cardiomegaly again noted. Stable hyperinflation and probable chronic mild interstitial prominence. Bilateral basilar atelectasis or scarring again noted. Electronically Signed   By: Lahoma Crocker M.D.   On: 04/04/2016 10:35    EKG:   Orders placed or performed during the hospital encounter of 04/04/16  . EKG 12-Lead  . EKG 12-Lead  afib  with RVR, left axis deviation, T-wave inversions in lead V1, V2,V6.  IMPRESSION AND PLAN:  #1 atrial fibrillation with RVR for almost 1 month with symptoms being not controlled with Cardizem, metoprolol, patient feeling dizzy, tired and still having elevated heart rate up to 1 40 bpm, cardiologist recommended possible cardioversion. Continue Xarelto, Cardizem, metoprolol. Hold off on IV Cardizem drip at this time. Patient received 1 dose of IV Lasix in the emergency room but I don't think she needs any more further doses of IV Lasix as she doesn't look fluid   overloaded. #2 acute respiratory failure with hypoxia secondary to PE?Continue oxygen, Xarelto, add albuterol inhaler prn for cough.  D/w daughter  All the records are reviewed and case discussed with ED provider. Management plans discussed with the patient, family and they are in agreement.  CODE STATUS: full  TOTAL TIME TAKING CARE OF THIS PATIENT: 55 minutes.    Epifanio Lesches M.D on 04/04/2016 at 3:44 PM  Between 7am to 6pm - Pager - [phone]  After 6pm go to [email or web address] - password [email or web address] [facility]  Office  [phone]  CC: Primary care physician; [name], MD  Note: This dictation was prepared with Dragon dictation along with smaller phrase technology. Any transcriptional errors that result from this process are unintentional.

## 2016-04-04 NOTE — ED Triage Notes (Addendum)
Pt sent from PCP for rapid AFIB, states she was seen in ED Saturday and had medications changed, states feeling weak and worn out with cough, awake and alert in no acute distress, PCP also stated pt has been desating with ambulation

## 2016-04-04 NOTE — Care Management Note (Signed)
Case Management Note  Patient Details  Name: Frances Kent MRN: [id number] Date of Birth: 1930-07-03  Subjective/Objective:                  Spoke with patient's daughter Frances Kent [phone] regarding discharge planning. Patient has frequented ED in recent dates. She is not on home O2. She is typically independent with daily activities/drives but since [id number] visit patient has not been at her baseline. She has struggled with ambulation and driving. Her PCP is Dr. Derrel Nip. She lives alone. Her daughter Frances Kent lives across the road and works during the day. She has several family members at live near by and check on her. A-fib relatively new diagnosis and states she "was not put on an anticoagulant". She was recently here for flu and admits to not taking her tamiflu as it made her sick on her stomach.  She may benefit from home health services this visit.  Action/Plan:  RNCM to continue to follow.   Expected Discharge Date:                  Expected Discharge Plan:     In-House Referral:     Discharge planning Services  CM Consult  Post Acute Care Choice:  Home Health, Durable Medical Equipment Choice offered to:  Patient, Adult Children  DME Arranged:    DME Agency:     HH Arranged:    [place] Agency:     Status of Service:  In process, will continue to follow  If discussed at Long Length of Stay Meetings, dates discussed:    Additional Comments:  Marshell Garfinkel, RN 04/04/2016, 4:36 PM

## 2016-04-04 NOTE — Plan of Care (Signed)
Problem: Pain Managment: Goal: General experience of comfort will improve Outcome: Progressing Patient has no complaints of pain at this time. Educated on available PRN medications, encouraged to monitor and report own pain.

## 2016-04-04 NOTE — Progress Notes (Signed)
Patient admitted to room 239, daughter at the bedside. A/O x4, VSS, no complaints of pain. Educated on the need to call for assistance for the first time out of bed, patient verbalized understanding. Bed alarm not in place at this time, family requested that it stayed off until they left the room. Head to toe assessment completed with Caleen Jobs, RN - no skin issues present at this time. Verified tele box 40-30 with Caleen Jobs, RN.

## 2016-04-04 NOTE — ED Provider Notes (Addendum)
[facility] Emergency Department Provider Note  ____________________________________________  Time seen: Approximately 10:11 AM  I have reviewed the triage vital signs and the nursing notes.   HISTORY  Chief Complaint Irregular Heart Beat   HPI Frances Kent is a 83 y.o. female with a history of atrial fibrillation, PE on Xarelto, pulmonary fibrosis who presents for evaluation of generalized weakness. Patient was recently switched from Cardizem to metoprolol 2 days ago. She reports that she has been in A. fib for an entire month since being diagnosed with pulmonary embolism. Patient reports that for the last month she has been feeling very fatigued, with no energy, she gets short of breath when her heart rate goes really fast. She denies flulike symptoms. She does have a cough productive of clear sputum but no fever, no chills, no nausea, no vomiting, no congestion, no diarrhea. Today she went to see her primary care doctor who was concerned about patient's heart rate, desats on ambulation, and also concerned that she had crackles on pulmonary auscultation and sent her over here for evaluation.Patient denies chest pain, only has shortness of breath when her heart rate goes really fast, no orthopnea, no weight gain, no leg pain or swelling.  Past Medical History:  Diagnosis Date  . Atrial fibrillation ([place])   . GERD (gastroesophageal reflux disease)   . IPF (idiopathic pulmonary fibrosis) ([place]) 20111   by CT, PFTS done by Humphrey Rolls  . Mitral regurgitation   . Pulmonary embolism [facility])     Patient Active Problem List   Diagnosis Date Noted  . Chronic a-fib ([place]) 04/04/2016  . Chronic pulmonary edema 03/10/2016  . Hospital discharge follow-up 03/10/2016  . SOB (shortness of breath) 03/04/2016  . Right ankle swelling 12/21/2015  . Vitamin D deficiency 04/08/2015  . Cervical spine degeneration 12/13/2014  . Pulmonary embolism ([place]) 12/02/2014  . Insomnia  10/04/2014  . Atrial fibrillation ([place]) 04/03/2014  . Skin lesion of face 12/21/2013  . Achilles tendinosis 12/21/2013  . Generalized anxiety disorder 03/28/2013  . Mitral valve prolapse 03/28/2013  . Encounter for preventive health examination 03/23/2012  . IPF (idiopathic pulmonary fibrosis) ([place])   . Screening for breast cancer 11/29/2010  . Macular degeneration, left eye 11/29/2010  . Screening for colon cancer 11/29/2010  . Hyperlipidemia LDL goal <160 11/29/2010  . GERD (gastroesophageal reflux disease)     Past Surgical History:  Procedure Laterality Date  . APPENDECTOMY  1960  . CHOLECYSTECTOMY  1985  . OVARIAN CYST REMOVAL      Prior to Admission medications   Medication Sig Start Date End Date Taking? Authorizing Provider  acidophilus (RISAQUAD) CAPS capsule Take 1 capsule by mouth daily. Reported on 04/20/2015   Yes Historical Provider, MD  Cholecalciferol (VITAMIN D3) 1000 units CAPS Take by mouth.   Yes Historical Provider, MD  metoprolol tartrate (LOPRESSOR) 25 MG tablet Take 25 mg by mouth 2 (two) times daily.   Yes Historical Provider, MD  pantoprazole (PROTONIX) 40 MG tablet Take 40 mg by mouth daily as needed.    Yes Historical Provider, MD  rivaroxaban (XARELTO) 20 MG TABS tablet Take 20 mg by mouth daily.   Yes Historical Provider, MD  Rivaroxaban 15 & 20 MG TBPK Take as directed on package: Start with one 15mg  tablet by mouth twice a day with food. On Day 22, switch to one 20mg  tablet once a day with food. 03/06/16  Yes Vaughan Basta, MD  bevacizumab (AVASTIN) 1.25 mg/0.1 mL SOLN by  Intravitreal route every 6 (six) weeks. For macular degeneration    Historical Provider, MD  diazepam (VALIUM) 5 MG tablet Take 1 tablet (5 mg total) by mouth every 12 (twelve) hours as needed for anxiety or muscle spasms. Patient not taking: Reported on 04/04/2016 12/11/14   [name], MD  diltiazem (CARDIZEM CD) 120 MG 24 hr capsule Take 1 capsule (120 mg total) by mouth  daily. Patient not taking: Reported on 04/04/2016 03/06/16   Vaughan Basta, MD  furosemide (LASIX) 20 MG tablet Take 1 tablet (20 mg total) by mouth daily. Patient not taking: Reported on 04/04/2016 03/09/16   [name], MD    Allergies Patient has no known allergies.  Family History  Problem Relation Age of Onset  . Cancer Mother 7    breast cancer, lived to 59,   . Cancer Son 51    pancreatic cancer  . Heart disease Son     CAD, Tobacco Abuse   . Heart disease Maternal Grandmother 80    died of massive MI    Social History Social History  Substance Use Topics  . Smoking status: Never Smoker  . Smokeless tobacco: Never Used     Comment: 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 , worked at [employer], [employer]  . Alcohol use No    Review of Systems  Constitutional: Negative for fever. + fatigue Eyes: Negative for visual changes. ENT: Negative for sore throat. Neck: No neck pain  Cardiovascular: Negative for chest pain. Respiratory: + shortness of breath, cough Gastrointestinal: Negative for abdominal pain, vomiting or diarrhea. Genitourinary: Negative for dysuria. Musculoskeletal: Negative for back pain. Skin: Negative for rash. Neurological: Negative for headaches, weakness or numbness. Psych: No SI or HI  ____________________________________________   PHYSICAL EXAM:  VITAL SIGNS: ED Triage Vitals  Enc Vitals Group     BP 04/04/16 0946 (!) 130/51     Pulse Rate 04/04/16 0946 (!) 122     Resp 04/04/16 0946 18     Temp 04/04/16 0946 97.6 F (36.4 C)     Temp Source 04/04/16 0946 Oral     SpO2 04/04/16 0946 98 %     Weight 04/04/16 0947 129 lb (58.5 kg)     Height 04/04/16 0947 5\' 3"  (1.6 m)     Head Circumference --      Peak Flow --      Pain Score 04/04/16 0947 0     Pain Loc --      Pain Edu? --      Excl. in [place]? --     Constitutional: Alert and oriented. Well appearing and in no apparent distress. HEENT:      Head: Normocephalic and atraumatic.          Eyes: Conjunctivae are normal. Sclera is non-icteric. EOMI. PERRL      Mouth/Throat: Mucous membranes are moist.       Neck: Supple with no signs of meningismus. Cardiovascular: Irregularly irregular rhythm with tachycardic rate. No murmurs, gallops, or rubs. 2+ symmetrical distal pulses are present in all extremities. No JVD. Respiratory: Normal respiratory effort. Lungs are clear to auscultation bilaterally. No wheezes, crackles, or rhonchi.  Gastrointestinal: Soft, non tender, and non distended with positive bowel sounds. No rebound or guarding. Musculoskeletal: Trace edema b/l Neurologic: Normal speech and language. Face is symmetric. Moving all extremities. No gross focal neurologic deficits are appreciated. Skin: Skin is warm, dry and intact. No rash noted. Psychiatric: Mood and affect are normal. Speech and behavior are normal.  ____________________________________________   LABS (all labs ordered are listed, but only abnormal results are displayed)  Labs Reviewed  CBC - Abnormal; Notable for the following:       Result Value   RDW 15.2 (*)    All other components within normal limits  BASIC METABOLIC PANEL - Abnormal; Notable for the following:    GFR calc non Af Amer 58 (*)    All other components within normal limits  BRAIN NATRIURETIC PEPTIDE - Abnormal; Notable for the following:    B Natriuretic Peptide 590.0 (*)    All other components within normal limits  TROPONIN I  CBC  CREATININE, SERUM   ____________________________________________  EKG  ED ECG REPORT I, Rudene Re, the attending physician, personally viewed and interpreted this ECG.  Atrial fibrillation, ventricular rate of 120, prolonged QTC, left axis deviation, no ST elevations or depressions, T-wave inversions in anterior leads. Unchanged from prior ____________________________________________  RADIOLOGY  CXR: No active disease. Cardiomegaly again noted. Stable hyperinflation and probable  chronic mild interstitial prominence. Bilateral basilar atelectasis or scarring again noted ____________________________________________   PROCEDURES  Procedure(s) performed: None Procedures Critical Care performed:  Yes  CRITICAL CARE Performed by: Rudene Re  ?  Total critical care time: 35 min  Critical care time was exclusive of separately billable procedures and treating other patients.  Critical care was necessary to treat or prevent imminent or life-threatening deterioration.  Critical care was time spent personally by me on the following activities: development of treatment plan with patient and/or surrogate as well as nursing, discussions with consultants, evaluation of patient's response to treatment, examination of patient, obtaining history from patient or surrogate, ordering and performing treatments and interventions, ordering and review of laboratory studies, ordering and review of radiographic studies, pulse oximetry and re-evaluation of patient's condition.  ____________________________________________   INITIAL IMPRESSION / ASSESSMENT AND PLAN / ED COURSE  83 y.o. female with a history of atrial fibrillation, home in a embolism on Xarelto, pulmonary fibrosis who presents for evaluation of generalized weakness x 1 month since being diagnosed with PE persistent afib since then. Patient is in RVR. Her lungs are clear she does have trace edema in her lower extremities. She came from PCPs office who noticed the patient was getting hypoxic with ambulation.Normal sat at rest here. We'll check her BNP and a chest x-ray. Her blood pressure is soft with systolics in the low [id number] therefore we'll give her 2.5 mg of IV metoprolol for rate control. Patient took her metoprolol this morning. Will discuss with Dr. [name] for further management as patient has been on cardizem and now metoprolol with unsuccessful rate control.   Clinical Course as of Apr 04 1536  Mon Apr 04, 2016  1430 Patient with significant SOB with ambulation, sats dropped to 88% and HR went up to 145. Will admit to Hospitalist for CHF exacerbation. Dr. [name] is aware and will come to see patient for further management. Did not recommended changing her back to diltiazem at this time. Rate is well controlled at rest.  [CV]    Clinical Course User Index [CV] Rudene Re, MD    Pertinent labs & imaging results that were available during my care of the patient were reviewed by me and considered in my medical decision making (see chart for details).    ____________________________________________   FINAL CLINICAL IMPRESSION(S) / ED DIAGNOSES  Final diagnoses:  Acute respiratory failure with hypoxia (HCC)  Acute on chronic congestive heart failure, unspecified congestive heart  failure type ([place])  Atrial fibrillation with RVR (HCC)      NEW MEDICATIONS STARTED DURING THIS VISIT:  New Prescriptions   No medications on file     Note:  This document was prepared using Dragon voice recognition software and may include unintentional dictation errors.    Rudene Re, MD 04/04/16 [place], MD 04/04/16 1539

## 2016-04-04 NOTE — Progress Notes (Signed)
Patient having symptoms a-fib seen UC over weekend with A-fib.Patient SOB coughing Pulse 143 to 157, BP 114/78 Patient is alert oriented. Called cardiology awaiting return, call ECG attained and PCP notified.Meto prolol 25 mg BID added on @/17/18 at ED D/C Cardizem on 04/02/16. No return call from cardiology confirmed with PCP ok for patient to travel by car to [place] confirmed . Patient has refused EMS transport. Patient was wheeled to car with supporting 02 as advised by PCP called ER and advised Triage patient arriving by car with pulse 143 to 157  And thatt patient has active A-Fib.

## 2016-04-04 NOTE — ED Notes (Signed)
Amb in hall, sat to 88 ambulating and HR 140, recovers 2 min in bed.

## 2016-04-05 ENCOUNTER — Inpatient Hospital Stay: Payer: Medicare HMO | Admitting: Anesthesiology

## 2016-04-05 ENCOUNTER — Encounter: Payer: Self-pay | Admitting: *Deleted

## 2016-04-05 ENCOUNTER — Encounter
Admission: EM | Disposition: A | Discharge status: Home / Self Care | Payer: Self-pay | Source: Home / Self Care | Attending: Internal Medicine

## 2016-04-05 HISTORY — PX: CARDIOVERSION: EP1203

## 2016-04-05 LAB — CBC
HCT: 36.5 % (ref 35.0–47.0)
Hemoglobin: 12.3 g/dL (ref 12.0–16.0)
MCH: 30 pg (ref 26.0–34.0)
MCHC: 33.6 g/dL (ref 32.0–36.0)
MCV: 89.3 fL (ref 80.0–100.0)
PLATELETS: 236 10*3/uL (ref 150–440)
RBC: 4.09 MIL/uL (ref 3.80–5.20)
RDW: 14.9 % — AB (ref 11.5–14.5)
WBC: 6.9 10*3/uL (ref 3.6–11.0)

## 2016-04-05 LAB — BASIC METABOLIC PANEL
Anion gap: 6 (ref 5–15)
BUN: 13 mg/dL (ref 6–20)
CALCIUM: 8.5 mg/dL — AB (ref 8.9–10.3)
CO2: 26 mmol/L (ref 22–32)
CREATININE: 0.89 mg/dL (ref 0.44–1.00)
Chloride: 109 mmol/L (ref 101–111)
GFR calc non Af Amer: 58 mL/min — ABNORMAL LOW (ref >60)
GLUCOSE: 94 mg/dL (ref 65–99)
Potassium: 3.8 mmol/L (ref 3.5–5.1)
Sodium: 141 mmol/L (ref 135–145)

## 2016-04-05 LAB — GLUCOSE, CAPILLARY: Glucose-Capillary: 95 mg/dL (ref 65–99)

## 2016-04-05 SURGERY — CARDIOVERSION (CATH LAB)
Anesthesia: General

## 2016-04-05 MED ORDER — SODIUM CHLORIDE 0.9 % IV SOLN: 250.0000 mL | INTRAVENOUS | Status: DC

## 2016-04-05 MED ORDER — SODIUM CHLORIDE 0.9% FLUSH: 3.0000 mL | INTRAVENOUS | Status: DC | PRN

## 2016-04-05 MED ORDER — PROPOFOL 10 MG/ML IV BOLUS
INTRAVENOUS | Status: AC
  Filled 2016-04-05: qty 40

## 2016-04-05 MED ORDER — RIVAROXABAN 15 MG PO TABS: 15.0000 mg | ORAL_TABLET | Freq: Every day | ORAL | Status: DC

## 2016-04-05 MED ORDER — RIVAROXABAN 20 MG PO TABS
20.0000 mg | ORAL_TABLET | Freq: Every day | ORAL | Status: DC
  Administered 2016-04-05: 20 mg via ORAL
  Filled 2016-04-05: qty 1

## 2016-04-05 MED ORDER — SODIUM CHLORIDE 0.9 % IV SOLN: INTRAVENOUS | Status: DC

## 2016-04-05 MED ORDER — PROPOFOL 10 MG/ML IV BOLUS
INTRAVENOUS | Status: DC | PRN
  Administered 2016-04-05: 40 mg via INTRAVENOUS

## 2016-04-05 MED ORDER — SODIUM CHLORIDE 0.9 % IV SOLN
INTRAVENOUS | Status: DC
  Administered 2016-04-05: 09:00:00 via INTRAVENOUS

## 2016-04-05 MED ORDER — SODIUM CHLORIDE 0.9% FLUSH: 3.0000 mL | Freq: Two times a day (BID) | INTRAVENOUS | Status: DC

## 2016-04-05 NOTE — Plan of Care (Signed)
Problem: Tissue Perfusion: Goal: Risk factors for ineffective tissue perfusion will decrease Outcome: Progressing Pt on Xarelto, ambulating in room  Problem: Activity: Goal: Ability to tolerate increased activity will improve Outcome: Not Progressing HR increasing with activity  Problem: Education: Goal: Knowledge of disease or condition will improve Outcome: Progressing Scheduled for Cardioversion today

## 2016-04-05 NOTE — Consult Note (Signed)
[facility] Consultation Note  Patient ID: Frances Kent, MRN: [id number], DOB/AGE: Dec 11, 1930 83 y.o. Admit date: 04/04/2016   Date of Consult: 04/05/2016 Primary Physician: [name], MD Primary Cardiologist: [name]  Chief Complaint:  Chief Complaint  Patient presents with  . Irregular Heart Beat   Reason for Consult: atrial fibrillation with rapid ventricular rate  HPI: 83 y.o. female with known paroxysmal nonvalvular atrial fibrillation that occurred a month ago when she had an episode of a pulmonary embolism of which was treated appropriately with anticoagulation. At that time the patient has done well with no evidence of significant worsening shortness of breath. Heart rate has been variable over the last several weeks with atrial fibrillation and medication management has been changed around for better heart rate control using diltiazem and metoprolol. The patient therefore has had difficulty with shortness of breath weakness and fatigue and some mild congestive heart failure type symptoms with a minimal amount of pulmonary edema due to this rapid ventricular rate. Today she has had better heart rate control with this combination of medication after admission to the hospital from episode of rapid ventricular rate and shortness of breath. The patient has been continued on appropriate anticoagulation for the last month and we have discussed at length the possibility of further intervention including electrical cardioversion to normal sinus rhythm. The patient understands risk and benefits of this to improve her overall status.  Past Medical History:  Diagnosis Date  . Atrial fibrillation ([place])   . GERD (gastroesophageal reflux disease)   . IPF (idiopathic pulmonary fibrosis) ([place]) [address]11   by CT, PFTS done by Humphrey Rolls  . Mitral regurgitation   . Pulmonary embolism [facility])       Surgical History:  Past Surgical History:  Procedure Laterality Date  . APPENDECTOMY   1960  . CHOLECYSTECTOMY  1985  . OVARIAN CYST REMOVAL       Home Meds: Prior to Admission medications   Medication Sig Start Date End Date Taking? Authorizing Provider  acidophilus (RISAQUAD) CAPS capsule Take 1 capsule by mouth daily. Reported on 04/20/2015   Yes Historical Provider, MD  Cholecalciferol (VITAMIN D3) 1000 units CAPS Take by mouth.   Yes Historical Provider, MD  metoprolol tartrate (LOPRESSOR) 25 MG tablet Take 25 mg by mouth 2 (two) times daily.   Yes Historical Provider, MD  pantoprazole (PROTONIX) 40 MG tablet Take 40 mg by mouth daily as needed.    Yes Historical Provider, MD  rivaroxaban (XARELTO) 20 MG TABS tablet Take 20 mg by mouth daily.   Yes Historical Provider, MD  Rivaroxaban 15 & 20 MG TBPK Take as directed on package: Start with one 15mg  tablet by mouth twice a day with food. On Day 22, switch to one 20mg  tablet once a day with food. 03/06/16  Yes Vaughan Basta, MD  bevacizumab (AVASTIN) 1.25 mg/0.1 mL SOLN by Intravitreal route every 6 (six) weeks. For macular degeneration    Historical Provider, MD  diazepam (VALIUM) 5 MG tablet Take 1 tablet (5 mg total) by mouth every 12 (twelve) hours as needed for anxiety or muscle spasms. Patient not taking: Reported on 04/04/2016 12/11/14   [name], MD  diltiazem (CARDIZEM CD) 120 MG 24 hr capsule Take 1 capsule (120 mg total) by mouth daily. Patient not taking: Reported on 04/04/2016 03/06/16   Vaughan Basta, MD  furosemide (LASIX) 20 MG tablet Take 1 tablet (20 mg total) by mouth daily. Patient not taking: Reported on 04/04/2016 03/09/16  [name], MD    Inpatient Medications:  . acidophilus  1 capsule Oral Daily  . diltiazem  120 mg Oral Daily  . docusate sodium  100 mg Oral BID  . furosemide  20 mg Oral Daily  . mouth rinse  15 mL Mouth Rinse BID  . metoprolol tartrate  25 mg Oral BID  . pantoprazole  40 mg Oral Daily  . rivaroxaban  20 mg Oral Q supper  . sodium chloride flush  3 mL  Intravenous Q12H   . sodium chloride    . sodium chloride      Allergies: No Known Allergies  Social History   Social History  . Marital status: Widowed    Spouse name: N/A  . Number of children: N/A  . Years of education: N/A   Occupational History  . Not on file.   Social History Main Topics  . Smoking status: Never Smoker  . Smokeless tobacco: Never Used     Comment: 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 , worked at [employer], [employer]  . Alcohol use No  . Drug use: No  . Sexual activity: Not Currently   Other Topics Concern  . Not on file   Social History Narrative   Has caretaking responsibility for 3 young grandchildren who live with her     Family History  Problem Relation Age of Onset  . Cancer Mother 68    breast cancer, lived to 5,   . Cancer Son 105    pancreatic cancer  . Heart disease Son     CAD, Tobacco Abuse   . Heart disease Maternal Grandmother 64    died of massive MI     Review of Systems Positive for Shortness of breath palpitations Negative for: General:  chills, fever, night sweats or weight changes.  Cardiovascular: PND orthopnea syncope dizziness  Dermatological skin lesions rashes Respiratory: Cough congestion Urologic: Frequent urination urination at night and hematuria Abdominal: negative for nausea, vomiting, diarrhea, bright red blood per rectum, melena, or hematemesis Neurologic: negative for visual changes, and/or hearing changes  All other systems reviewed and are otherwise negative except as noted above.  Labs:  Recent Labs  04/02/16 1200 04/04/16 1117  TROPONINI <0.03 <0.03   Lab Results  Component Value Date   WBC 6.9 04/05/2016   HGB 12.3 04/05/2016   HCT 36.5 04/05/2016   MCV 89.3 04/05/2016   PLT 236 04/05/2016    Recent Labs Lab 04/05/16 0430  NA 141  K 3.8  CL 109  CO2 26  BUN 13  CREATININE 0.89  CALCIUM 8.5*  GLUCOSE 94   Lab Results  Component Value Date   CHOL 266 (H) 04/06/2015   HDL 81.60 04/06/2015    LDLCALC 166 (H) 04/06/2015   TRIG 95.0 04/06/2015   Lab Results  Component Value Date   DDIMER 1.10 (H) 12/21/2015    Radiology/Studies:  Dg Chest 2 View  Result Date: 04/04/2016 CLINICAL DATA:  Atrial fibrillation, weakness EXAM: CHEST  2 VIEW COMPARISON:  04/02/2016 FINDINGS: Cardiomegaly again noted. Stable hyperinflation and probable chronic mild interstitial prominence bilaterally. Stable bilateral basilar atelectasis or scarring. Osteopenia and mild degenerative changes thoracic spine. No superimposed infiltrate or pulmonary edema. IMPRESSION: No active disease. Cardiomegaly again noted. Stable hyperinflation and probable chronic mild interstitial prominence. Bilateral basilar atelectasis or scarring again noted. Electronically Signed   By: Lahoma Crocker M.D.   On: 04/04/2016 10:35   Dg Chest 2 View  Result Date: 04/02/2016 CLINICAL DATA:  Atrial  fibrillation, persistent cough, recent pulmonary embolus starting on Xarelto, shortness of breath when walking long distances, idiopathic pulmonary fibrosis EXAM: CHEST  2 VIEW COMPARISON:  03/04/2016 FINDINGS: Enlargement of cardiac silhouette with slight pulmonary vascular congestion. Atherosclerotic calcification aorta. Mild chronic interstitial prominence and underlying emphysematous changes. Bibasilar atelectasis. No acute infiltrate, pleural effusion or pneumothorax. Diffuse osseous demineralization. IMPRESSION: Emphysematous changes with chronic interstitial disease and bibasilar atelectasis. Enlargement of cardiac silhouette with pulmonary vascular congestion. No definite acute infiltrate. Electronically Signed   By: Lavonia Dana M.D.   On: 04/02/2016 12:47    EKG: Atrial fibrillation with controlled ventricular rate  Weights: Filed Weights   04/04/16 0947 04/04/16 1700 04/05/16 0414  Weight: 58.5 kg (129 lb) 59 kg (130 lb) 58.2 kg (128 lb 3.2 oz)     Physical Exam: Blood pressure 98/63, pulse (!) 101, temperature 97.8 F (36.6 C),  temperature source Oral, resp. rate 17, height 5\' 3"  (1.6 m), weight 58.2 kg (128 lb 3.2 oz), SpO2 97 %. Body mass index is 22.71 kg/m. General: Well developed, well nourished, in no acute distress. Head eyes ears nose throat: Normocephalic, atraumatic, sclera non-icteric, no xanthomas, nares are without discharge. No apparent thyromegaly and/or mass  Lungs: Normal respiratory effort.  no wheezes, Some basilar crackles no rhonchi.  Heart: Irregular with normal S1 S2. no murmur gallop, no rub, PMI is normal size and placement, carotid upstroke normal without bruit, jugular venous pressure is normal Abdomen: Soft, non-tender, non-distended with normoactive bowel sounds. No hepatomegaly. No rebound/guarding. No obvious abdominal masses. Abdominal aorta is normal size without bruit Extremities: No edema. no cyanosis, no clubbing, no ulcers  Peripheral : 2+ bilateral upper extremity pulses, 2+ bilateral femoral pulses, 2+ bilateral dorsal pedal pulse Neuro: Alert and oriented. No facial asymmetry. No focal deficit. Moves all extremities spontaneously. Musculoskeletal: Normal muscle tone without kyphosis Psych:  Responds to questions appropriately with a normal affect.    Assessment: 83 year old female with the paroxysmal nonvalvular atrial fibrillation with rapid ventricular rate causing shortness of breath weakness and fatigue get on appropriate medication management with increased dose of metoprolol diltiazem now better controlled needing further intervention  Plan: 1. Continue Xarelto for further risk reduction in stroke with atrial fibrillation 2. Proceed to electrical cardioversion of atrial fibrillation to normal sinus rhythm. Patient understands the risk and benefits of this electrical cardioversion. This includes a possibility of death stroke heart attack other rhythm disturbances and side effects of medication management. The patient is at low risk for general anesthesia 3. Continue current  medical regimen including diltiazem and metoprolol for heart rate control until electrical cardioversion 4. Further treatment options after above  Signed, [name] M.D. [facility] 04/05/2016, 8:47 AM

## 2016-04-05 NOTE — Progress Notes (Signed)
Patient was with her daughter when Joshua visited. Patient appreciated  Chaplain's visit, but she said that she has a pastor who visits her on a regular basis.

## 2016-04-05 NOTE — Anesthesia Post-op Follow-up Note (Cosign Needed)
Anesthesia QCDR form completed.        

## 2016-04-05 NOTE — Progress Notes (Signed)
Subjective:  Patient ID: Frances Kent, female    DOB: Aug 17, 1930  Age: 83 y.o. MRN: [id number]  CC: The encounter diagnosis was Persistent atrial fibrillation ([place]).  HPI Frances Kent presents for shortness of breath. Patient was scheduled for follow up today on chronic issues and was seen earlier than expected due to presenting early to  Triage with progressive dyspnea. She was seen by her cardiologist on Feb 8 and was noted to be in sinus rhythm. Furosemide was discontinued and Xarelto was changed to Eliquis    Patient is accompanied by her daughter who reports that she developed rapid heart rate and was seen by UC on Feb 18 for recurrent  A-fib. Metoprolol 25 mg bid was added.  Patient has developed orthopnea, cough and dyspnea with exertion, Triage RN noted  Pulse 143 to 157, BP 114/78 Patient is alert and oriented.   No facility-administered medications prior to visit.    Outpatient Medications Prior to Visit  Medication Sig Dispense Refill  . acidophilus (RISAQUAD) CAPS capsule Take 1 capsule by mouth daily. Reported on 04/20/2015    . bevacizumab (AVASTIN) 1.25 mg/0.1 mL SOLN by Intravitreal route every 6 (six) weeks. For macular degeneration    . Cholecalciferol (VITAMIN D3) 1000 units CAPS Take by mouth.    . diazepam (VALIUM) 5 MG tablet Take 1 tablet (5 mg total) by mouth every 12 (twelve) hours as needed for anxiety or muscle spasms. (Patient not taking: Reported on 04/04/2016) 60 tablet 1  . furosemide (LASIX) 20 MG tablet Take 1 tablet (20 mg total) by mouth daily. (Patient not taking: Reported on 04/04/2016) 30 tablet 0  . pantoprazole (PROTONIX) 40 MG tablet Take 40 mg by mouth daily as needed.     . Rivaroxaban 15 & 20 MG TBPK Take as directed on package: Start with one 15mg  tablet by mouth twice a day with food. On Day 22, switch to one 20mg  tablet once a day with food. 51 each 0  . diltiazem (CARDIZEM CD) 120 MG 24 hr capsule Take 1 capsule (120 mg total) by mouth daily.  (Patient not taking: Reported on 04/04/2016) 30 capsule 0    Review of Systems;  Patient denies headache, fevers, malaise, unintentional weight loss, skin rash, eye pain, sinus congestion and sinus pain, sore throat, dysphagia,  hemoptysis , cough, dyspnea, wheezing, chest pain, palpitations, orthopnea, edema, abdominal pain, nausea, melena, diarrhea, constipation, flank pain, dysuria, hematuria, urinary  Frequency, nocturia, numbness, tingling, seizures,  Focal weakness, Loss of consciousness,  Tremor, insomnia, depression, anxiety, and suicidal ideation.      Objective:  BP 114/78 (BP Location: Left Arm, Patient Position: Sitting, Cuff Size: Normal)   Pulse (!) 143   Temp 97.9 F (36.6 C) (Oral)   Wt 129 lb 4 oz (58.6 kg)   SpO2 (!) 86%   BMI 22.90 kg/m   BP Readings from Last 3 Encounters:  04/05/16 98/63  04/04/16 114/78  04/02/16 137/78    Wt Readings from Last 3 Encounters:  04/05/16 128 lb 3.2 oz (58.2 kg)  04/04/16 129 lb 4 oz (58.6 kg)  04/02/16 132 lb (59.9 kg)    General appearance: alert, cooperative and appears stated age Neck: no adenopathy, no carotid bruit, supple, symmetrical, trachea midline and thyroid not enlarged, symmetric, no tenderness/mass/nodules. No JVD noted.  Back: symmetric, no curvature. ROM normal. No CVA tenderness. Lungs: bilateral rales to midway of posterior lung fields.  Heart: irreg irreg,tachycardic Abdomen: soft, non-tender; bowel sounds normal; no  masses,  no organomegaly Pulses: 2+ and symmetric Skin: Skin pale ,  texture, turgor normal. No rashes or lesions Lymph nodes: Cervical, supraclavicular, and axillary nodes normal.  No results found for: HGBA1C  Lab Results  Component Value Date   CREATININE 0.89 04/05/2016   CREATININE 0.90 04/04/2016   CREATININE 1.00 04/02/2016    Lab Results  Component Value Date   WBC 6.9 04/05/2016   HGB 12.3 04/05/2016   HCT 36.5 04/05/2016   PLT 236 04/05/2016   GLUCOSE 94 04/05/2016    CHOL 266 (H) 04/06/2015   TRIG 95.0 04/06/2015   HDL 81.60 04/06/2015   LDLDIRECT 185.3 03/27/2013   LDLCALC 166 (H) 04/06/2015   ALT 10 04/06/2015   AST 15 04/06/2015   NA 141 04/05/2016   K 3.8 04/05/2016   CL 109 04/05/2016   CREATININE 0.89 04/05/2016   BUN 13 04/05/2016   CO2 26 04/05/2016   TSH 0.947 03/04/2016   INR 1.03 03/04/2016    Dg Chest 2 View  Result Date: 04/04/2016 CLINICAL DATA:  Atrial fibrillation, weakness EXAM: CHEST  2 VIEW COMPARISON:  04/02/2016 FINDINGS: Cardiomegaly again noted. Stable hyperinflation and probable chronic mild interstitial prominence bilaterally. Stable bilateral basilar atelectasis or scarring. Osteopenia and mild degenerative changes thoracic spine. No superimposed infiltrate or pulmonary edema. IMPRESSION: No active disease. Cardiomegaly again noted. Stable hyperinflation and probable chronic mild interstitial prominence. Bilateral basilar atelectasis or scarring again noted. Electronically Signed   By: Lahoma Crocker M.D.   On: 04/04/2016 10:35    Assessment & Plan:   Problem List Items Addressed This Visit    Atrial fibrillation ([place])    With RVR and decompensated heart failure suggested by lung exam.  Patient refused ems transport. Resting 02 sats are >92% on room air.  Could not reach her local cardiologist so she will be taken to local ED by daughter via private automobile.  ER is aware of her condition and awaiting arrival/       Relevant Medications   metoprolol tartrate (LOPRESSOR) 25 MG tablet   Other Relevant Orders   EKG 12-Lead (Completed)      I am having Frances Kent maintain her pantoprazole, acidophilus, diazepam, bevacizumab, Vitamin D3, diltiazem, Rivaroxaban, furosemide, and metoprolol tartrate.  Meds ordered this encounter  Medications  . metoprolol tartrate (LOPRESSOR) 25 MG tablet    Sig: Take 25 mg by mouth 2 (two) times daily.    There are no discontinued medications.  Follow-up: No Follow-up on  file.   [name], MD

## 2016-04-05 NOTE — Progress Notes (Signed)
Initial Nutrition Assessment  DOCUMENTATION CODES:   Not applicable  INTERVENTION:  1. Magic cup TID with meals, each supplement provides 290 kcal and 9 grams of protein  NUTRITION DIAGNOSIS:   Inadequate oral intake related to poor appetite as evidenced by per patient/family report.  GOAL:   Patient will meet greater than or equal to 90% of their needs  MONITOR:   PO intake, I & O's, Labs, Weight trends, Supplement acceptance  REASON FOR ASSESSMENT:   Rounds    ASSESSMENT:   Frances Kent  is a 83 y.o. female with Essential hypertension, chronic atrial fibrillation, pulmonary emboli , see primary doctor today because of dizziness, tiredness, found to have hypoxia with O2 sats 88% on room air and she was sent to emergency room because of that.pt is having  shortness of breath, fatigue  for almost one week, patient feels really tired and feels like she has no energy  Spoke with [name], family at bedside. Patient reports poor appetite x1 week related to shortness of breath. Reported PO intake was ambiguous. Appears she consumes snacks every 1-2 hours: cookies, banana sandwich, apple, chips; a variety of snacks. Per family, patient is a good cook, but appears she does not cook much anymore. She states a normal weight in the 130-135 range. Per chart she exhibits a 6#/4.5% insignificant wt loss over 1 month. Had a boxed lunch following her cardioversion on her lap during our visit. She had not started eating yet.  Nutrition-Focused physical exam completed. Findings are mild fat depletion, mild muscle depletion, and no edema.   Denies any nausea/vomiting/diarrhea/constipation Denies any chewing/swallowing/choking problems.  Labs and medications reviewed: Colace NS @ 44mL/hr  Diet Order:  Diet 2 gram sodium Room service appropriate? Yes; Fluid consistency: Thin Diet NPO time specified Diet NPO time specified  Skin:  Reviewed, no issues  Last BM:   04/04/2016  Height:   Ht Readings from Last 1 Encounters:  04/05/16 5\' 3"  (1.6 m)    Weight:   Wt Readings from Last 1 Encounters:  04/05/16 128 lb (58.1 kg)    Ideal Body Weight:  52.27 kg  BMI:  Body mass index is 22.67 kg/m.  Estimated Nutritional Needs:   Kcal:  1454-1750 calories  Protein:  58-70 gm  Fluid:  >/= 1.4L  EDUCATION NEEDS:   No education needs identified at this time  Satira Anis. Frances Mehlman, MS, RD LDN Inpatient Clinical Dietitian Pager [phone]

## 2016-04-05 NOTE — Progress Notes (Signed)
To Specials via bed 

## 2016-04-05 NOTE — Anesthesia Procedure Notes (Signed)
Date/Time: 04/05/2016 12:21 PM Performed by: Nelda Marseille Pre-anesthesia Checklist: Patient identified, Emergency Drugs available, Suction available, Patient being monitored and Timeout performed Oxygen Delivery Method: Nasal cannula

## 2016-04-05 NOTE — Progress Notes (Signed)
Pt returned from [place] s/p electrical cardioversion, pt now in NSR in 60's.  A&O x 3, no distress on ra.  Cardiac monitor in place, denies need, family at bedside.

## 2016-04-05 NOTE — Progress Notes (Signed)
[place] [facility] [place] NAME: Frances Kent    MR#:  [id number]  DATE OF BIRTH:  [birth year]  SUBJECTIVE:  CHIEF COMPLAINT:   Chief Complaint  Patient presents with  . Irregular Heart Beat   The patient is 83 year old Caucasian female with past medical history significant for history of essential hypertension, chronic atrial fibrillation, pulmonary emboli, who presented to the hospital with complaints of dizziness, feeling tired. She was noted to be hypoxic at 88% on room air, sent to emergency room for further evaluation and treatment.. Initial heart rate was ranging between 120-140. Chest x-ray revealed emphysema, pulmonary vascular congestion. Patient received intravenous metoprolol, Lasix and emergency room and admitted to the hospital. She was consulted by cardiologist, who recommended cardioversion. Patient underwent cardioversion on February 20. She felt with post-cardioversion, denied shortness of breath.    Review of Systems  Constitutional: Negative for chills, fever and weight loss.  HENT: Negative for congestion.   Eyes: Negative for blurred vision and double vision.  Respiratory: Negative for cough, sputum production, shortness of breath and wheezing.   Cardiovascular: Negative for chest pain, palpitations, orthopnea, leg swelling and PND.  Gastrointestinal: Negative for abdominal pain, blood in stool, constipation, diarrhea, nausea and vomiting.  Genitourinary: Negative for dysuria, frequency, hematuria and urgency.  Musculoskeletal: Negative for falls.  Neurological: Negative for dizziness, tremors, focal weakness and headaches.  Endo/Heme/Allergies: Does not bruise/bleed easily.  Psychiatric/Behavioral: Negative for depression. The patient does not have insomnia.     VITAL SIGNS: Blood pressure (!) 97/46, pulse (!) 56, temperature 98.1 F (36.7 C), temperature source Oral, resp. rate 17, height 5\' 3"  (1.6 m), weight 58.1  kg (128 lb), SpO2 97 %.  PHYSICAL EXAMINATION:   GENERAL:  83 y.o.-year-old patient sitting in the bed with no acute distress.  EYES: Pupils equal, round, reactive to light and accommodation. No scleral icterus. Extraocular muscles intact.  HEENT: Head atraumatic, normocephalic. Oropharynx and nasopharynx clear.  NECK:  Supple, no jugular venous distention. No thyroid enlargement, no tenderness.  LUNGS: Normal breath sounds bilaterally, no wheezing, rales,rhonchi or crepitation. No use of accessory muscles of respiration.  CARDIOVASCULAR: S1, S2 normal. No murmurs, rubs, or gallops.  ABDOMEN: Soft, nontender, nondistended. Bowel sounds present. No organomegaly or mass.  EXTREMITIES: No pedal edema, cyanosis, or clubbing.  NEUROLOGIC: Cranial nerves II through XII are intact. Muscle strength 5/5 in all extremities. Sensation intact. Gait not checked.  PSYCHIATRIC: The patient is alert and oriented x 3.  SKIN: No obvious rash, lesion, or ulcer.   ORDERS/RESULTS REVIEWED:   CBC  Recent Labs Lab 04/02/16 1200 04/04/16 1117 04/05/16 0430  WBC 6.8 6.5 6.9  HGB 12.6 12.0 12.3  HCT 37.7 37.1 36.5  PLT 258 250 236  MCV 89.5 90.5 89.3  MCH 29.9 29.4 30.0  MCHC 33.4 32.5 33.6  RDW 15.2* 15.2* 14.9*   ------------------------------------------------------------------------------------------------------------------  Chemistries   Recent Labs Lab 04/02/16 1200 04/04/16 1117 04/05/16 0430  NA 140 141 141  K 3.8 4.3 3.8  CL 108 110 109  CO2 24 23 26   GLUCOSE 105* 99 94  BUN 11 16 13   CREATININE 1.00 0.90 0.89  CALCIUM 9.2 9.0 8.5*   ------------------------------------------------------------------------------------------------------------------ estimated creatinine clearance is 39.6 mL/min (by C-G formula based on SCr of 0.89 mg/dL). ------------------------------------------------------------------------------------------------------------------ No results for input(s): TSH,  T4TOTAL, T3FREE, THYROIDAB in the last 72 hours.  Invalid input(s): FREET3  Cardiac Enzymes  Recent Labs Lab 04/02/16 1200 04/04/16 1117  TROPONINI <0.03 <0.03   ------------------------------------------------------------------------------------------------------------------ Invalid input(s): POCBNP ---------------------------------------------------------------------------------------------------------------  RADIOLOGY: Dg Chest 2 View  Result Date: 04/04/2016 CLINICAL DATA:  Atrial fibrillation, weakness EXAM: CHEST  2 VIEW COMPARISON:  04/02/2016 FINDINGS: Cardiomegaly again noted. Stable hyperinflation and probable chronic mild interstitial prominence bilaterally. Stable bilateral basilar atelectasis or scarring. Osteopenia and mild degenerative changes thoracic spine. No superimposed infiltrate or pulmonary edema. IMPRESSION: No active disease. Cardiomegaly again noted. Stable hyperinflation and probable chronic mild interstitial prominence. Bilateral basilar atelectasis or scarring again noted. Electronically Signed   By: Lahoma Crocker M.D.   On: 04/04/2016 10:35    EKG:  Orders placed or performed during the hospital encounter of 04/04/16  . EKG 12-Lead  . EKG 12-Lead  . EKG 12-Lead pre-cardioversion  . EKG 12-Lead  . EKG 12-Lead pre-cardioversion  . EKG 12-Lead  . EKG 12-Lead pre-cardioversion  . EKG 12-Lead  . EKG 12-Lead pre-cardioversion  . EKG 12-Lead  . EKG 12-Lead  . EKG 12-Lead  . EKG 12-Lead  . EKG 12-Lead    ASSESSMENT AND PLAN:  Active Problems:   Chronic a-fib ([place])  #1. Atrial fibrillation, RVR, status post cardioversion 04/05/2016, now in sinus rhythm, continue patient on metoprolol and Xarelto #2. Hypotension, stop Lasix, initiated on IV fluids, follow blood pressure readings closely #3. Acute  diastolic CHF due to A. fib, RVR, was given Lasix in the emergency room, now in sinus rhythm, stop Lasix, follow closely #4. Generalized weakness, initiate  physical therapy  Management plans discussed with the patient, family and they are in agreement.   DRUG ALLERGIES: No Known Allergies  CODE STATUS:     Code Status Orders        Start     Ordered   04/04/16 1441  Full code  Continuous     04/04/16 1444    Code Status History    Date Active Date Inactive Code Status Order ID Comments User Context   03/04/2016 11:53 AM 03/06/2016  6:52 PM Full Code [id number]  Dustin Flock, MD ED   12/04/2014 12:15 PM 12/05/2014  4:04 PM Full Code [id number]  Vaughan Basta, MD Inpatient   12/02/2014  2:56 PM 12/03/2014  6:03 PM Full Code [id number]  Aldean Jewett, MD ED    Advance Directive Documentation   Flowsheet Row Most Recent Value  Type of Advance Directive  Healthcare Power of [place], Living will  Pre-existing out of facility DNR order (yellow form or pink MOST form)  No data  "MOST" Form in Place?  No data      TOTAL TIME TAKING CARE OF THIS PATIENT: 35 minutes.  Discussed with patient's daughter  Theodoro Grist M.D on 04/05/2016 at 3:27 PM  Between 7am to 6pm - Pager - [phone]  After 6pm go to [email or web address] - password [email or web address] [facility]  Office  [phone]  CC: Primary care physician; [name], MD

## 2016-04-05 NOTE — CV Procedure (Signed)
Electrical Cardioversion Procedure Note Frances Kent [id number] Aug 17, 1930  Procedure: Electrical Cardioversion Indications:  Atrial Fibrillation  Procedure Details Consent: Risks of procedure as well as the alternatives and risks of each were explained to the (patient/caregiver).  Consent for procedure obtained. Time Out: Verified patient identification, verified procedure, site/side was marked, verified correct patient position, special equipment/implants available, medications/allergies/relevent history reviewed, required imaging and test results available.  Performed  Patient placed on cardiac monitor, pulse oximetry, supplemental oxygen as necessary.  Sedation given: Benzodiazepines and Short-acting barbiturates Pacer pads placed anterior and posterior chest.  Cardioverted 1 time(s).  Cardioverted at 120J.  Evaluation Findings: Post procedure EKG shows: NSR Complications: None Patient did tolerate procedure well.   [name] 04/05/2016, 1:10 PM

## 2016-04-05 NOTE — Anesthesia Preprocedure Evaluation (Signed)
Anesthesia Evaluation  Patient identified by MRN, date of birth, ID band Patient awake    Reviewed: Allergy & Precautions, H&P , NPO status , Patient's Chart, lab work & pertinent test results  History of Anesthesia Complications Negative for: history of anesthetic complications  Airway Mallampati: III  TM Distance: <3 FB Neck ROM: limited    Dental  (+) Poor Dentition, Chipped, Missing, Partial Lower, Edentulous Upper   Pulmonary shortness of breath and with exertion, pneumonia, resolved,    Pulmonary exam normal breath sounds clear to auscultation       Cardiovascular Exercise Tolerance: Good (-) angina(-) Past MI Normal cardiovascular exam+ dysrhythmias Atrial Fibrillation  Rhythm:regular Rate:Normal     Neuro/Psych PSYCHIATRIC DISORDERS Anxiety negative neurological ROS     GI/Hepatic Neg liver ROS, GERD  Controlled and Medicated,  Endo/Other  negative endocrine ROS  Renal/GU negative Renal ROS  negative genitourinary   Musculoskeletal  (+) Arthritis ,   Abdominal   Peds  Hematology negative hematology ROS (+)   Anesthesia Other Findings Past Medical History: No date: Atrial fibrillation (HCC) No date: GERD (gastroesophageal reflux disease) 20111: IPF (idiopathic pulmonary fibrosis) (HCC)     Comment: by CT, PFTS done by Humphrey Rolls No date: Mitral regurgitation No date: Pulmonary embolism [facility])  Past Surgical History: 1960: APPENDECTOMY 1985: CHOLECYSTECTOMY No date: OVARIAN CYST REMOVAL  BMI    Body Mass Index:  22.67 kg/m      Reproductive/Obstetrics negative OB ROS                             Anesthesia Physical Anesthesia Plan  ASA: III  Anesthesia Plan: General   Post-op Pain Management:    Induction:   Airway Management Planned:   Additional Equipment:   Intra-op Plan:   Post-operative Plan:   Informed Consent: I have reviewed the patients History and  Physical, chart, labs and discussed the procedure including the risks, benefits and alternatives for the proposed anesthesia with the patient or authorized representative who has indicated his/her understanding and acceptance.   Dental Advisory Given  Plan Discussed with: Anesthesiologist, CRNA and Surgeon  Anesthesia Plan Comments:         Anesthesia Quick Evaluation

## 2016-04-05 NOTE — Plan of Care (Signed)
Problem: Safety: Goal: Ability to remain free from injury will improve Outcome: Progressing Pt remains free from falls/injury this shift. Does not require bed alarm, non skid socks in place, daughter at bedside. Encouraged to call if help needed  Problem: Pain Managment: Goal: General experience of comfort will improve Outcome: Completed/Met Date Met: 04/05/16 Pt with no complaints of pain this shift.  Problem: Cardiac: Goal: Ability to achieve and maintain adequate cardiopulmonary perfusion will improve Outcome: Progressing HR is still uncontrolled with pt gets up, but has dropped down to the 80's still in afib though.

## 2016-04-05 NOTE — Anesthesia Postprocedure Evaluation (Signed)
Anesthesia Post Note  Patient: Frances Kent  Procedure(s) Performed: Procedure(s) (LRB): Cardioversion (N/A)  Patient location during evaluation: Cath Lab Anesthesia Type: General Level of consciousness: awake and alert Pain management: pain level controlled Vital Signs Assessment: post-procedure vital signs reviewed and stable Respiratory status: spontaneous breathing, nonlabored ventilation, respiratory function stable and patient connected to nasal cannula oxygen Cardiovascular status: blood pressure returned to baseline and stable Postop Assessment: no signs of nausea or vomiting Anesthetic complications: no     Last Vitals:  Vitals:   04/05/16 1330 04/05/16 1345  BP: (!) 92/53 (!) 97/46  Pulse: 65 (!) 56  Resp: 13 17  Temp:  36.7 C    Last Pain:  Vitals:   04/05/16 1345  TempSrc: Oral  PainSc:                  [name] Piscitello

## 2016-04-05 NOTE — Assessment & Plan Note (Signed)
With RVR and decompensated heart failure suggested by lung exam.  Patient refused ems transport. Resting 02 sats are >92% on room air.  Could not reach her local cardiologist so she will be taken to local ED by daughter via private automobile.  ER is aware of her condition and awaiting arrival/

## 2016-04-05 NOTE — Transfer of Care (Signed)
Immediate Anesthesia Transfer of Care Note  Patient: Frances Kent  Procedure(s) Performed: Procedure(s): Cardioversion (N/A)  Patient Location: PACU  Anesthesia Type:General  Level of Consciousness: sedated  Airway & Oxygen Therapy: Patient Spontanous Breathing and Patient connected to nasal cannula oxygen  Post-op Assessment: Report given to RN and Post -op Vital signs reviewed and stable  Post vital signs: Reviewed and stable  Last Vitals:  Vitals:   04/05/16 1158 04/05/16 1200  BP: (!) 119/58 (!) 119/58  Pulse: 90 91  Resp: 20 19  Temp: 36.6 C     Last Pain:  Vitals:   04/05/16 1158  TempSrc: Oral  PainSc:          Complications: No apparent anesthesia complications

## 2016-04-06 ENCOUNTER — Encounter: Payer: Self-pay | Admitting: Internal Medicine

## 2016-04-06 DIAGNOSIS — R531 Weakness: Secondary | ICD-10-CM

## 2016-04-06 DIAGNOSIS — I5031 Acute diastolic (congestive) heart failure: Secondary | ICD-10-CM

## 2016-04-06 DIAGNOSIS — I4891 Unspecified atrial fibrillation: Secondary | ICD-10-CM

## 2016-04-06 DIAGNOSIS — I959 Hypotension, unspecified: Secondary | ICD-10-CM

## 2016-04-06 NOTE — Evaluation (Signed)
Physical Therapy Evaluation Patient Details Name: Frances Kent MRN: [id number] DOB: 06-30-1930 Today's Date: 04/06/2016   History of Present Illness  Pt admitted for afib. Pt with complaints of dizziness and hypoxia upon admission. PMH includes HTN, afib, PE, and GERD. Pt with electrical cardioversion by cardiologist on 04/05/16.   Clinical Impression  Pt is a pleasant 83 year old female who was admitted for afib with irregular pattern. Pt demonstrates all bed mobility/transfers/ambulation at baseline level. Safe technique performed with stair training. HR at 65bpm with rest increasing to 79bpm with exertion. Pt reports she feels safe to dc home this date and has sufficient help at home. Pt does not require any further PT needs at this time. Pt will be dc in house and does not require follow up. RN aware. Will dc current orders.      Follow Up Recommendations No PT follow up    Equipment Recommendations  None recommended by PT    Recommendations for Other Services       Precautions / Restrictions Restrictions Weight Bearing Restrictions: No      Mobility  Bed Mobility Overal bed mobility: Independent             General bed mobility comments: safe technique performed with independence noted  Transfers Overall transfer level: Independent               General transfer comment: safe technique performed without AD used  Ambulation/Gait Ambulation/Gait assistance: Supervision Ambulation Distance (Feet): 300 Feet Assistive device: None Gait Pattern/deviations: WFL(Within Functional Limits)     General Gait Details: ambulated with reciprocal gait pattern and good gait speed. No LOB or dizziness reported. HR monitored during ambulation increasing to 79bpm with exertion   Stairs Stairs: Yes Stairs assistance: Supervision Stair Management: One rail Right;Alternating pattern Number of Stairs: 6 General stair comments: navigated up/down stairs with safe technique  with no LOB noted. No safety concerns with stair training  Wheelchair Mobility    Modified Rankin (Stroke Patients Only)       Balance Overall balance assessment: Independent                                           Pertinent Vitals/Pain Pain Assessment: No/denies pain    Home Living Family/patient expects to be discharged to:: Private residence Living Arrangements:  (has grandsons and family near by) Available Help at Discharge: Family Type of Home: [place]: Laundry or work area in [place]: None      Prior Function Level of Independence: Independent         Comments: indep and driving prior to admission     Hand Dominance        Extremity/Trunk Assessment   Upper Extremity Assessment Upper Extremity Assessment: Overall WFL for tasks assessed    Lower Extremity Assessment Lower Extremity Assessment: Overall WFL for tasks assessed       Communication   Communication: No difficulties  Cognition Arousal/Alertness: Awake/alert Behavior During Therapy: WFL for tasks assessed/performed Overall Cognitive Status: Within Functional Limits for tasks assessed                      General Comments      Exercises     Assessment/Plan    PT Assessment Patent does not need any further PT services  PT Problem List         PT Treatment Interventions      PT Goals (Current goals can be found in the Care Plan section)  Acute Rehab PT Goals Patient Stated Goal: to go home PT Goal Formulation: All assessment and education complete, DC therapy Time For Goal Achievement: 2016/04/14 Potential to Achieve Goals: Good    Frequency     Barriers to discharge        Co-evaluation               End of Session Equipment Utilized During Treatment: Gait belt Activity Tolerance: Patient tolerated treatment well Patient left: in bed Nurse Communication: Mobility status PT Visit Diagnosis:  Unsteadiness on feet (R26.81);Muscle weakness (generalized) (M62.81)         Time: [id number] PT Time Calculation (min) (ACUTE ONLY): 11 min   Charges:   PT Evaluation $PT Eval Low Complexity: 1 Procedure     PT G Codes:         Shakur Lembo 04/14/16, 11:30 AM  Greggory Stallion, PT, DPT [phone]

## 2016-04-06 NOTE — Progress Notes (Signed)
Patient is alert and oriented, vss, no complaints of pain.  D/C telemetry and d/c PIV.  No new Rx.  F/U appts made.  No questions at this time.  Patient escorted out of hospital via wheelchair by volunteers.

## 2016-04-06 NOTE — Plan of Care (Signed)
Problem: Safety: Goal: Ability to remain free from injury will improve Outcome: Completed/Met Date Met: 04/06/16 Pt continues to remain free from injury/falls. Does not score high enough for bed alarm. Non skid socks on, encouraged to call if needed. Will continue to monitor.  Problem: Cardiac: Goal: Ability to achieve and maintain adequate cardiopulmonary perfusion will improve Outcome: Completed/Met Date Met: 04/06/16 Pt now in sinus rhythm.

## 2016-04-06 NOTE — Care Management Important Message (Signed)
Important Message  Patient Details  Name: Frances Kent MRN: [id number] Date of Birth: [birth year]   Medicare Important Message Given:  Yes  Initial signed IM printed from Epic and given to patient.    Katrina Stack, RN 04/06/2016, 10:08 AM

## 2016-04-06 NOTE — Progress Notes (Signed)
[facility] Cardiology [facility] Encounter Note  Patient: Frances Kent / Admit Date: 04/04/2016 / Date of Encounter: 04/06/2016, 7:41 AM   Subjective: Patient feels much better today. No evidence of shortness of breath or pulmonary Rales. Patient remains in normal sinus rhythm after electrical cardioversion yesterday. No evidence of significant side effects of medications  Review of Systems: Positive for: None Negative for: Vision change, hearing change, syncope, dizziness, nausea, vomiting,diarrhea, bloody stool, stomach pain, cough, congestion, diaphoresis, urinary frequency, urinary pain,skin lesions, skin rashes Others previously listed  Objective: Telemetry: Normal sinus rhythm Physical Exam: Blood pressure (!) 113/52, pulse 67, temperature 97.7 F (36.5 C), temperature source Oral, resp. rate 19, height 5\' 3"  (1.6 m), weight 62 kg (136 lb 9.6 oz), SpO2 97 %. Body mass index is 24.2 kg/m. General: Well developed, well nourished, in no acute distress. Head: Normocephalic, atraumatic, sclera non-icteric, no xanthomas, nares are without discharge. Neck: No apparent masses Lungs: Normal respirations with no wheezes, no rhonchi, no rales , few basilar crackles   Heart: Regular rate and rhythm, normal S1 S2, no murmur, no rub, no gallop, PMI is normal size and placement, carotid upstroke normal without bruit, jugular venous pressure normal Abdomen: Soft, non-tender, non-distended with normoactive bowel sounds. No hepatosplenomegaly. Abdominal aorta is normal size without bruit Extremities: No edema, no clubbing, no cyanosis, no ulcers,  Peripheral: 2+ radial, 2+ femoral, 2+ dorsal pedal pulses Neuro: Alert and oriented. Moves all extremities spontaneously. Psych:  Responds to questions appropriately with a normal affect.   Intake/Output Summary (Last 24 hours) at 04/06/16 0741 Last data filed at 04/05/16 2230  Gross per 24 hour  Intake            667.5 ml  Output               600 ml  Net             67.5 ml    Inpatient Medications:  . acidophilus  1 capsule Oral Daily  . docusate sodium  100 mg Oral BID  . mouth rinse  15 mL Mouth Rinse BID  . metoprolol tartrate  25 mg Oral BID  . pantoprazole  40 mg Oral Daily  . rivaroxaban  20 mg Oral Q supper   Infusions:   Labs:  Recent Labs  04/04/16 1117 04/05/16 0430  NA 141 141  K 4.3 3.8  CL 110 109  CO2 23 26  GLUCOSE 99 94  BUN 16 13  CREATININE 0.90 0.89  CALCIUM 9.0 8.5*   No results for input(s): AST, ALT, ALKPHOS, BILITOT, PROT, ALBUMIN in the last 72 hours.  Recent Labs  04/04/16 1117 04/05/16 0430  WBC 6.5 6.9  HGB 12.0 12.3  HCT 37.1 36.5  MCV 90.5 89.3  PLT 250 236    Recent Labs  04/04/16 1117  TROPONINI <0.03   Invalid input(s): POCBNP No results for input(s): HGBA1C in the last 72 hours.   Weights: Filed Weights   04/05/16 0414 04/05/16 1158 04/06/16 0439  Weight: 58.2 kg (128 lb 3.2 oz) 58.1 kg (128 lb) 62 kg (136 lb 9.6 oz)     Radiology/Studies:  Dg Chest 2 View  Result Date: 04/04/2016 CLINICAL DATA:  Atrial fibrillation, weakness EXAM: CHEST  2 VIEW COMPARISON:  04/02/2016 FINDINGS: Cardiomegaly again noted. Stable hyperinflation and probable chronic mild interstitial prominence bilaterally. Stable bilateral basilar atelectasis or scarring. Osteopenia and mild degenerative changes thoracic spine. No superimposed infiltrate or pulmonary edema. IMPRESSION: No active disease.  Cardiomegaly again noted. Stable hyperinflation and probable chronic mild interstitial prominence. Bilateral basilar atelectasis or scarring again noted. Electronically Signed   By: Lahoma Crocker M.D.   On: 04/04/2016 10:35   Dg Chest 2 View  Result Date: 04/02/2016 CLINICAL DATA:  Atrial fibrillation, persistent cough, recent pulmonary embolus starting on Xarelto, shortness of breath when walking long distances, idiopathic pulmonary fibrosis EXAM: CHEST  2 VIEW COMPARISON:  03/04/2016 FINDINGS:  Enlargement of cardiac silhouette with slight pulmonary vascular congestion. Atherosclerotic calcification aorta. Mild chronic interstitial prominence and underlying emphysematous changes. Bibasilar atelectasis. No acute infiltrate, pleural effusion or pneumothorax. Diffuse osseous demineralization. IMPRESSION: Emphysematous changes with chronic interstitial disease and bibasilar atelectasis. Enlargement of cardiac silhouette with pulmonary vascular congestion. No definite acute infiltrate. Electronically Signed   By: Lavonia Dana M.D.   On: 04/02/2016 12:47     Assessment and Recommendation  83 y.o. female with the paroxysmal nonvalvular atrial fibrillation with rapid ventricular rate causing pulmonary edema and other shortness of breath now electrical cardioverted into normal rhythm without complication improved dramatically on appropriate medication management 1. Continue metoprolol 25 mg twice per day for heart rate control and maintenance of normal sinus rhythm 2. Continue Xarelto at current dose for further risk reduction in stroke with atrial fibrillation 3. Further cardiac diagnostics or intervention at this time 4. Begin ambulation and follow for any further significant symptoms and need for adjustments of medications but okay for discharge home from cardiac standpoint with follow-up next week  Signed, Serafina Royals M.D. FACC

## 2016-04-08 ENCOUNTER — Telehealth: Payer: Self-pay | Admitting: Internal Medicine

## 2016-04-08 ENCOUNTER — Other Ambulatory Visit: Payer: Self-pay

## 2016-04-08 NOTE — Telephone Encounter (Signed)
Transition Care Management Follow-up Telephone Call  How have you been since you were released from the hospital? "I felt good I don't give out of breath, Cough has been better."   Do you understand why you were in the hospital? Yes , "My heart was out of rhythm.   Do you understand the discharge instrcutions? Yes, No strenuous activity.  Items Reviewed:  Medications reviewed: Yes, Patient does not want to use Valium until speaking with PCP.  Allergies reviewed:Yes  Dietary changes reviewed:Yes  Referrals reviewed: Yes, Follow up with Primary and Dr. Sharyne Peach on 04/14/16   Functional Questionnaire:   Activities of Daily Living (ADLs):   She states they are independent in the following: Doing Lite house work, dressing bathing, lite cooking . States they require assistance with the following:  Patient is climbing stairs with care not exert her self.   Any transportation issues/concerns?: No.   Any patient concerns? Sometimes patient feels jittery and wondered if she  Should take her  Valium?   Confirmed importance and date/time of follow-up visits scheduled: Yes,   Confirmed with patient if condition begins to worsen call PCP or go to the ER.  Patient was given the Call-a-Nurse line [phone]: Yes.

## 2016-04-08 NOTE — Telephone Encounter (Signed)
Yes she can use 1/2 vALIUM  Tablet if needed for jitteriness

## 2016-04-08 NOTE — Patient Outreach (Addendum)
[facility]) Care Management  04/08/2016  AQUISHA WILBORNE 06/20/1930 [id number]     Transition of Care Referral  Referral Date: 04/07/16 Referral Source: [facility] Discharge Report Date of Discharge: 04/06/16 Facility: [facility] Diagnosis:A-fib, dizziness, hypoxia Insurance: [employer] attempt # 1 to patient. Spoke with patient.  Social: Patient resides in her home alone. She voices that she independent with ADLs/IADLs. She has supportive dtr that is able to assist as needed. Patient denies any recent falls.  Condition: She has PMH of HTN. A-fib, PE and GERD. Patient was hospitalized from 04/04/16 to 04/06/16 for symptomatic A-fib. She underwent cardioversion on 04/05/16. She states since then her heart rhythm has been normal. Patient has BP monitor in the home and is checking BP and HR date and keeping a log. Last BP reading was 110/63 and HR 73. She denies any symptoms of dizziness and/or SOB since return home. RN CM discussed with patient s/s of worsening condition and when to seek medical attention. Patient voiced understanding.  Medications: Patient reports she has all her meds, are able to afford them and understands when and how to take meds.  Appointments: She has PCP f/u appt on 04/11/16 and goes to see cardiologist on 04/14/16. Patient reports she has been cleared to drive and will take herself to medical appts.  Consent: Patient voices she is doing well since discharge. She denies any TOC needs or concerns. She was appreciative of follow up call.    Plan: RN CM will notify [facility] administrative assistant of case status.   Enzo Montgomery, RN,BSN,CCM [place] Management Telephonic Care Management Coordinator Direct Phone: [phone] Toll Free: [phone] Fax: [phone]

## 2016-04-08 NOTE — Telephone Encounter (Signed)
Medication list is up to date the bevacizumab was put on hold toil follow up with cardiology and patient was unsure concerning valium if she should use when she feels jittery and anxious. No Dis charge summary available to date.

## 2016-04-08 NOTE — Telephone Encounter (Signed)
Patient notified

## 2016-04-10 NOTE — Discharge Summary (Signed)
[place] [facility] [place] NAME: Frances Kent    MR#:  [id number]  DATE OF BIRTH:  06/12/1930  DATE OF ADMISSION:  04/04/2016 ADMITTING PHYSICIAN: Epifanio Lesches, MD  DATE OF DISCHARGE: 04/06/2016 11:57 AM  PRIMARY CARE PHYSICIAN: [name], MD     ADMISSION DIAGNOSIS:  Acute respiratory failure with hypoxia (HCC) [J96.01] Atrial fibrillation with RVR (HCC) [I48.91] Acute on chronic congestive heart failure, unspecified congestive heart failure type ([place]) [I50.9]  DISCHARGE DIAGNOSIS:  Active Problems:   Atrial fibrillation with RVR (HCC)   Atrial fibrillation status post cardioversion (HCC)   Hypotension   Acute diastolic CHF (congestive heart failure) (HCC)   Generalized weakness   SECONDARY DIAGNOSIS:   Past Medical History:  Diagnosis Date  . Atrial fibrillation ([place])   . GERD (gastroesophageal reflux disease)   . IPF (idiopathic pulmonary fibrosis) ([place]) [address]11   by CT, PFTS done by Humphrey Rolls  . Mitral regurgitation   . Pulmonary embolism ([place])     .pro HOSPITAL COURSE:  The patient is 83 year old Caucasian female with past medical history significant for history of essential hypertension, chronic atrial fibrillation, pulmonary emboli, who presented to the hospital with complaints of dizziness, feeling tired. She was noted to be hypoxic at 88% on room air, sent to emergency room for further evaluation and treatment.. Initial heart rate was ranging between 120-140. Chest x-ray revealed hyperinflation, mild pulmonary vascular congestion. Patient received intravenous metoprolol, Lasix and emergency room and admitted to the hospital. She was consulted by cardiologist, who recommended cardioversion. Patient underwent cardioversion on April 05 2016. She felt good post-cardioversion, had no shortness of breath. She was discharged home. Discussion by problem: #1. Atrial fibrillation, RVR, status post cardioversion  04/05/2016, now in sinus rhythm, continue patient on metoprolol and Xarelto #2. Hypotension, the patient was given some IV fluids postconversion, blood pressure readings have improved and remained stable #3. Acute  diastolic CHF due to A. fib, RVR, the patient was given Lasix in the emergency room, resolved with conversion to sinus rhythm #4. Generalized weakness, physical therapist saw the patient in consultation, recommended no PT follow up  DISCHARGE CONDITIONS:   stable  CONSULTS OBTAINED:    DRUG ALLERGIES:  No Known Allergies  DISCHARGE MEDICATIONS:   Discharge Medication List as of 04/06/2016 11:37 AM    CONTINUE these medications which have NOT CHANGED   Details  acidophilus (RISAQUAD) CAPS capsule Take 1 capsule by mouth daily. Reported on 04/20/2015, Historical Med    Cholecalciferol (VITAMIN D3) 1000 units CAPS Take by mouth., Historical Med    metoprolol tartrate (LOPRESSOR) 25 MG tablet Take 25 mg by mouth 2 (two) times daily., Historical Med    pantoprazole (PROTONIX) 40 MG tablet Take 40 mg by mouth daily as needed. , Historical Med    rivaroxaban (XARELTO) 20 MG TABS tablet Take 20 mg by mouth daily., Historical Med    bevacizumab (AVASTIN) 1.25 mg/0.1 mL SOLN by Intravitreal route every 6 (six) weeks. For macular degeneration, Historical Med    diazepam (VALIUM) 5 MG tablet Take 1 tablet (5 mg total) by mouth every 12 (twelve) hours as needed for anxiety or muscle spasms., Starting Thu 12/11/2014, Print      STOP taking these medications     Rivaroxaban 15 & 20 MG TBPK      diltiazem (CARDIZEM CD) 120 MG 24 hr capsule      furosemide (LASIX) 20 MG tablet  DISCHARGE INSTRUCTIONS:    The patient is to follow up with PCP within 1 week  If you experience worsening of your admission symptoms, develop shortness of breath, life threatening emergency, suicidal or homicidal thoughts you must seek medical attention immediately by calling 911 or calling  your MD immediately  if symptoms less severe.  You Must read complete instructions/literature along with all the possible adverse reactions/side effects for all the Medicines you take and that have been prescribed to you. Take any new Medicines after you have completely understood and accept all the possible adverse reactions/side effects.   Please note  You were cared for by a hospitalist during your hospital stay. If you have any questions about your discharge medications or the care you received while you were in the hospital after you are discharged, you can call the unit and asked to speak with the hospitalist on call if the hospitalist that took care of you is not available. Once you are discharged, your primary care physician will handle any further medical issues. Please note that NO REFILLS for any discharge medications will be authorized once you are discharged, as it is imperative that you return to your primary care physician (or establish a relationship with a primary care physician if you do not have one) for your aftercare needs so that they can reassess your need for medications and monitor your lab values.    Today   CHIEF COMPLAINT:   Chief Complaint  Patient presents with  . Irregular Heart Beat    HISTORY OF PRESENT ILLNESS:  Frances Kent  is a 83 y.o. female with a known history of essential hypertension, chronic atrial fibrillation, pulmonary emboli, who presented to the hospital with complaints of dizziness, feeling tired. She was noted to be hypoxic at 88% on room air, sent to emergency room for further evaluation and treatment.. Initial heart rate was ranging between 120-140. Chest x-ray revealed hyperinflation, mild pulmonary vascular congestion. Patient received intravenous metoprolol, Lasix and emergency room and admitted to the hospital. She was consulted by cardiologist, who recommended cardioversion. Patient underwent cardioversion on April 05 2016. She felt  good post-cardioversion, had no shortness of breath. She was discharged home. Discussion by problem: #1. Atrial fibrillation, RVR, status post cardioversion 04/05/2016, now in sinus rhythm, continue patient on metoprolol and Xarelto #2. Hypotension, the patient was given some IV fluids postconversion, blood pressure readings have improved and remained stable #3. Acute  diastolic CHF due to A. fib, RVR, the patient was given Lasix in the emergency room, resolved with conversion to sinus rhythm #4. Generalized weakness, physical therapist saw the patient in consultation, recommended no PT follow up     VITAL SIGNS:  Blood pressure (!) 113/52, pulse 67, temperature 97.7 F (36.5 C), temperature source Oral, resp. rate 19, height 5\' 3"  (1.6 m), weight 62 kg (136 lb 9.6 oz), SpO2 97 %.  I/O:  No intake or output data in the 24 hours ending 04/10/16 1859  PHYSICAL EXAMINATION:  GENERAL:  83 y.o.-year-old patient lying in the bed with no acute distress.  EYES: Pupils equal, round, reactive to light and accommodation. No scleral icterus. Extraocular muscles intact.  HEENT: Head atraumatic, normocephalic. Oropharynx and nasopharynx clear.  NECK:  Supple, no jugular venous distention. No thyroid enlargement, no tenderness.  LUNGS: Normal breath sounds bilaterally, no wheezing, rales,rhonchi or crepitation. No use of accessory muscles of respiration.  CARDIOVASCULAR: S1, S2 normal. No murmurs, rubs, or gallops.  ABDOMEN: Soft, non-tender, non-distended. Bowel sounds  present. No organomegaly or mass.  EXTREMITIES: No pedal edema, cyanosis, or clubbing.  NEUROLOGIC: Cranial nerves II through XII are intact. Muscle strength 5/5 in all extremities. Sensation intact. Gait not checked.  PSYCHIATRIC: The patient is alert and oriented x 3.  SKIN: No obvious rash, lesion, or ulcer.   DATA REVIEW:   CBC  Recent Labs Lab 04/05/16 0430  WBC 6.9  HGB 12.3  HCT 36.5  PLT 236    Chemistries    Recent Labs Lab 04/05/16 0430  NA 141  K 3.8  CL 109  CO2 26  GLUCOSE 94  BUN 13  CREATININE 0.89  CALCIUM 8.5*    Cardiac Enzymes  Recent Labs Lab 04/04/16 1117  [place] <0.03    Microbiology Results  Results for orders placed or performed in visit on 02/05/13  Urine culture     Status: None   Collection Time: 02/05/13  2:08 PM  Result Value Ref Range Status   Colony Count NO GROWTH  Final   Organism ID, Bacteria NO GROWTH  Final    RADIOLOGY:  No results found.  EKG:   Orders placed or performed during the hospital encounter of 04/04/16  . EKG 12-Lead  . EKG 12-Lead  . EKG 12-Lead pre-cardioversion  . EKG 12-Lead  . EKG 12-Lead pre-cardioversion  . EKG 12-Lead  . EKG 12-Lead pre-cardioversion  . EKG 12-Lead  . EKG 12-Lead pre-cardioversion  . EKG 12-Lead  . EKG 12-Lead  . EKG 12-Lead  . EKG 12-Lead  . EKG 12-Lead  . EKG      Management plans discussed with the patient, family and they are in agreement.  CODE STATUS:  Code Status History    Date Active Date Inactive Code Status Order ID Comments User Context   04/04/2016  2:44 PM 04/06/2016  3:02 PM Full Code [id number]  Epifanio Lesches, MD ED   03/04/2016 11:53 AM 03/06/2016  6:52 PM Full Code [id number]  Dustin Flock, MD ED   12/04/2014 12:15 PM 12/05/2014  4:04 PM Full Code [id number]  Vaughan Basta, MD Inpatient   12/02/2014  2:56 PM 12/03/2014  6:03 PM Full Code [id number]  Aldean Jewett, MD ED    Advance Directive Documentation   Flowsheet Row Most Recent Value  Type of Advance Directive  Healthcare Power of Attorney, Living will  Pre-existing out of facility DNR order (yellow form or pink MOST form)  No data  "MOST" Form in Place?  No data      TOTAL TIME TAKING CARE OF THIS PATIENT: 40 minutes.    Theodoro Grist M.D on 04/10/2016 at 6:59 PM  Between 7am to 6pm - Pager - [phone]  After 6pm go to [email or web address] - password [email or web address] [facility]   Office  [phone]  CC: Primary care physician; [name], MD

## 2016-04-11 ENCOUNTER — Ambulatory Visit (INDEPENDENT_AMBULATORY_CARE_PROVIDER_SITE_OTHER): Payer: Medicare HMO | Admitting: Internal Medicine

## 2016-04-11 ENCOUNTER — Encounter: Payer: Self-pay | Admitting: Internal Medicine

## 2016-04-11 DIAGNOSIS — I5031 Acute diastolic (congestive) heart failure: Secondary | ICD-10-CM | POA: Diagnosis not present

## 2016-04-11 DIAGNOSIS — R059 Cough, unspecified: Secondary | ICD-10-CM

## 2016-04-11 DIAGNOSIS — I4891 Unspecified atrial fibrillation: Secondary | ICD-10-CM

## 2016-04-11 DIAGNOSIS — I481 Persistent atrial fibrillation: Secondary | ICD-10-CM

## 2016-04-11 DIAGNOSIS — I4819 Other persistent atrial fibrillation: Secondary | ICD-10-CM

## 2016-04-11 DIAGNOSIS — R05 Cough: Secondary | ICD-10-CM

## 2016-04-11 DIAGNOSIS — Z09 Encounter for follow-up examination after completed treatment for conditions other than malignant neoplasm: Secondary | ICD-10-CM

## 2016-04-11 MED ORDER — PREDNISONE 10 MG PO TABS: ORAL_TABLET | ORAL | 0 refills | Status: DC

## 2016-04-11 MED ORDER — BENZONATATE 200 MG PO CAPS: 200.0000 mg | ORAL_CAPSULE | Freq: Three times a day (TID) | ORAL | 1 refills | Status: DC | PRN

## 2016-04-11 MED ORDER — PANTOPRAZOLE SODIUM 40 MG PO TBEC: 40.0000 mg | DELAYED_RELEASE_TABLET | Freq: Every day | ORAL | 3 refills | Status: DC | PRN

## 2016-04-11 NOTE — Progress Notes (Signed)
Pre visit review using our clinic review tool, if applicable. No additional management support is needed unless otherwise documented below in the visit note. 

## 2016-04-11 NOTE — Progress Notes (Signed)
Subjective:  Patient ID: Frances Kent, female    DOB: Jun 30, 1930  Age: 83 y.o. MRN: [id number]  CC: Diagnoses of Cough in adult, Persistent atrial fibrillation (HCC), Atrial fibrillation status post cardioversion [facility]), Acute diastolic CHF (congestive heart failure) (Frances Kent), and Hospital discharge follow-up were pertinent to this visit.  HPI [name] presents for hospital follow up.  She was amitted to [facility] on feb 19 after presenting to the office same day in acute respiratory failure secondary to  atrial fib with rvr .  Symptoms had been present for 2-3 days (over the weekend) after her anti arrhythmic medications had been adjusted by her cardiologist during a recent office visit.  We were unable to reach him on the day she presented to the office so she was sent to the ER.   She underwent cardioversion on feb 20, and was discharged on Feb 21 with metoprolol and xarelto  hospital records reviewed.  troponins were negative   Sees her cardiologist [name] on Thursday.  She feels better and denies dyspnea but "Has been taking it easy" since discharge.   Bothered by a hacking cough which has been present episodically since jan 19th . Some days she coughs very little,  Some days she is woken up by at 3 am.    Cough is Dry,  And is aggravated by dry foods, including potato chips and peanuts.  Taking PPI daily .  CT chest in Jan diagnosed Pulmonary embolism , but  also saw mediastinal LAD and ground glass  attenuation  concerning for aspiration and pneumonitis.  She has a history of pulmonology fibrosis by prior  Imaging .  No recent swallow evaluation or EGD    Has not seen a pulmonologist   Anxiety:  She was prescribed but is not taking valium bc it "didn't work'and made her bp drop,  still feeling anxious   Outpatient Medications Prior to Visit  Medication Sig Dispense Refill  . acidophilus (RISAQUAD) CAPS capsule Take 1 capsule by mouth daily. Reported on 04/20/2015    . bevacizumab  (AVASTIN) 1.25 mg/0.1 mL SOLN by Intravitreal route every 6 (six) weeks. For macular degeneration    . Cholecalciferol (VITAMIN D3) 1000 units CAPS Take by mouth.    . diazepam (VALIUM) 5 MG tablet Take 1 tablet (5 mg total) by mouth every 12 (twelve) hours as needed for anxiety or muscle spasms. 60 tablet 1  . metoprolol tartrate (LOPRESSOR) 25 MG tablet Take 25 mg by mouth 2 (two) times daily.    . rivaroxaban (XARELTO) 20 MG TABS tablet Take 20 mg by mouth daily.    . pantoprazole (PROTONIX) 40 MG tablet Take 40 mg by mouth daily as needed.      No facility-administered medications prior to visit.     Review of Systems;  Patient denies headache, fevers, malaise, unintentional weight loss, skin rash, eye pain, sinus congestion and sinus pain, sore throat, dysphagia,  hemoptysis , cough, dyspnea, wheezing, chest pain, palpitations, orthopnea, edema, abdominal pain, nausea, melena, diarrhea, constipation, flank pain, dysuria, hematuria, urinary  Frequency, nocturia, numbness, tingling, seizures,  Focal weakness, Loss of consciousness,  Tremor, insomnia, depression, anxiety, and suicidal ideation.      Objective:  BP 138/74   Pulse (!) 55   Resp 16   Wt 132 lb (59.9 kg)   SpO2 96%   BMI 23.38 kg/m   BP Readings from Last 3 Encounters:  04/11/16 138/74  04/06/16 (!) 113/52  04/04/16 114/78  Wt Readings from Last 3 Encounters:  04/11/16 132 lb (59.9 kg)  04/06/16 136 lb 9.6 oz (62 kg)  04/04/16 129 lb 4 oz (58.6 kg)    General appearance: alert, cooperative and appears stated age Ears: normal TM's and external ear canals both ears Throat: lips, mucosa, and tongue normal; teeth and gums normal Neck: no adenopathy, no carotid bruit, supple, symmetrical, trachea midline and thyroid not enlarged, symmetric, no tenderness/mass/nodules Back: symmetric, no curvature. ROM normal. No CVA tenderness. Lungs: clear to auscultation bilaterally Heart: regular rate and rhythm, S1, S2  normal, no murmur, click, rub or gallop Abdomen: soft, non-tender; bowel sounds normal; no masses,  no organomegaly Pulses: 2+ and symmetric Skin: Skin color, texture, turgor normal. No rashes or lesions Lymph nodes: Cervical, supraclavicular, and axillary nodes normal.  No results found for: HGBA1C  Lab Results  Component Value Date   CREATININE 0.89 04/05/2016   CREATININE 0.90 04/04/2016   CREATININE 1.00 04/02/2016    Lab Results  Component Value Date   WBC 6.9 04/05/2016   HGB 12.3 04/05/2016   HCT 36.5 04/05/2016   PLT 236 04/05/2016   GLUCOSE 94 04/05/2016   CHOL 266 (H) 04/06/2015   TRIG 95.0 04/06/2015   HDL 81.60 04/06/2015   LDLDIRECT 185.3 03/27/2013   LDLCALC 166 (H) 04/06/2015   ALT 10 04/06/2015   AST 15 04/06/2015   NA 141 04/05/2016   K 3.8 04/05/2016   CL 109 04/05/2016   CREATININE 0.89 04/05/2016   BUN 13 04/05/2016   CO2 26 04/05/2016   TSH 0.947 03/04/2016   INR 1.03 03/04/2016    Dg Chest 2 View  Result Date: 04/04/2016 CLINICAL DATA:  Atrial fibrillation, weakness EXAM: CHEST  2 VIEW COMPARISON:  04/02/2016 FINDINGS: Cardiomegaly again noted. Stable hyperinflation and probable chronic mild interstitial prominence bilaterally. Stable bilateral basilar atelectasis or scarring. Osteopenia and mild degenerative changes thoracic spine. No superimposed infiltrate or pulmonary edema. IMPRESSION: No active disease. Cardiomegaly again noted. Stable hyperinflation and probable chronic mild interstitial prominence. Bilateral basilar atelectasis or scarring again noted. Electronically Signed   By: Lahoma Crocker M.D.   On: 04/04/2016 10:35    Assessment & Plan:   Problem List Items Addressed This Visit    Acute diastolic CHF (congestive heart failure) ([name])    Lung exam is clear today .  No diuretics were prescribed.       RESOLVED: Atrial fibrillation [facility])   Atrial fibrillation status post cardioversion [facility])    Rate is now slow and she appears to  maintaining sinus rhythm s/p cardioversion on Feb 2 for RVR .  She is not symptomatic,  So no changes were made today to metoprolol dose.  Continue xarelto       Cough in adult    ddx includes known diagnosis of IPF,reflux, aspirtion given question of  pneumonitis (suggested by January CT).  Trial of prednisone taper,  Will send for barium swallow to assess for stricture, achalasia, reflux.      Relevant Orders   [facility] discharge follow-up    All labs , imaging studies and progress notes from admission were reviewed with patient  today Patient is stable post discharge and has no new issues or questions about discharge plans at the visit today for hospital follow up.          I have changed Ms. Deleon's pantoprazole. I am also having her start on predniSONE and benzonatate. Additionally, I am having her maintain  her acidophilus, diazepam, bevacizumab, Vitamin D3, metoprolol tartrate, and rivaroxaban.  Meds ordered this encounter  Medications  . pantoprazole (PROTONIX) 40 MG tablet    Sig: Take 1 tablet (40 mg total) by mouth daily as needed.    Dispense:  90 tablet    Refill:  3  . predniSONE (DELTASONE) 10 MG tablet    Sig: 6 tablets on Day 1 , then reduce by 1 tablet daily until gone    Dispense:  21 tablet    Refill:  0  . benzonatate (TESSALON) 200 MG capsule    Sig: Take 1 capsule (200 mg total) by mouth 3 (three) times daily as needed for cough.    Dispense:  60 capsule    Refill:  1    Medications Discontinued During This Encounter  Medication Reason  . pantoprazole (PROTONIX) 40 MG tablet Reorder    Follow-up: No Follow-up on file.   [name], MD

## 2016-04-11 NOTE — Patient Instructions (Signed)
We are going to try to treat your cough with prednisone taper for 6 days  You can use the cough capsules 3 times daily as needed for cough  If the cough does not resolve,  I recommend seeing Dr Alva Garnet, our pulmonologist   Ask your eye doctor about whether  the eliquis has to be suspended before your eye injection,  If he says ,yes,  You will need to wait until April to suspend   it  or whatever time frame

## 2016-04-12 NOTE — Assessment & Plan Note (Signed)
All labs , imaging studies and progress notes from admission were reviewed with patient  today Patient is stable post discharge and has no new issues or questions about discharge plans at the visit today for hospital follow up.

## 2016-04-12 NOTE — Assessment & Plan Note (Signed)
Rate is now slow and she appears to maintaining sinus rhythm s/p cardioversion on Feb 2 for RVR .  She is not symptomatic,  So no changes were made today to metoprolol dose.  Continue xarelto

## 2016-04-12 NOTE — Assessment & Plan Note (Signed)
ddx includes known diagnosis of IPF,reflux, aspirtion given question of  pneumonitis (suggested by January CT).  Trial of prednisone taper,  Will send for barium swallow to assess for stricture, achalasia, reflux.

## 2016-04-12 NOTE — Assessment & Plan Note (Deleted)
Rate is now slow and she appears to maintaining sinus rhythm s/p cardioversion on Feb 2 for RVR .  She is not symptomatic,  So no changes were made today to metoprolol dose.  Continue xarelto

## 2016-04-12 NOTE — Assessment & Plan Note (Signed)
Lung exam is clear today .  No diuretics were prescribed.

## 2016-04-14 DIAGNOSIS — I480 Paroxysmal atrial fibrillation: Secondary | ICD-10-CM | POA: Diagnosis not present

## 2016-04-14 DIAGNOSIS — I10 Essential (primary) hypertension: Secondary | ICD-10-CM | POA: Diagnosis not present

## 2016-04-14 DIAGNOSIS — I2692 Saddle embolus of pulmonary artery without acute cor pulmonale: Secondary | ICD-10-CM | POA: Diagnosis not present

## 2016-04-14 DIAGNOSIS — I4891 Unspecified atrial fibrillation: Secondary | ICD-10-CM | POA: Diagnosis not present

## 2016-04-14 DIAGNOSIS — I341 Nonrheumatic mitral (valve) prolapse: Secondary | ICD-10-CM | POA: Diagnosis not present

## 2016-05-06 DIAGNOSIS — H353212 Exudative age-related macular degeneration, right eye, with inactive choroidal neovascularization: Secondary | ICD-10-CM | POA: Diagnosis not present

## 2016-05-09 ENCOUNTER — Ambulatory Visit: Payer: Medicare HMO

## 2016-05-12 DIAGNOSIS — I10 Essential (primary) hypertension: Secondary | ICD-10-CM | POA: Diagnosis not present

## 2016-05-12 DIAGNOSIS — R001 Bradycardia, unspecified: Secondary | ICD-10-CM | POA: Diagnosis not present

## 2016-05-12 DIAGNOSIS — I480 Paroxysmal atrial fibrillation: Secondary | ICD-10-CM | POA: Diagnosis not present

## 2016-05-12 DIAGNOSIS — I340 Nonrheumatic mitral (valve) insufficiency: Secondary | ICD-10-CM | POA: Diagnosis not present

## 2016-06-14 ENCOUNTER — Ambulatory Visit (INDEPENDENT_AMBULATORY_CARE_PROVIDER_SITE_OTHER): Payer: Medicare HMO

## 2016-06-14 VITALS — BP 118/62 | HR 53 | Temp 98.0°F | Resp 12 | Ht 63.0 in | Wt 133.1 lb

## 2016-06-14 DIAGNOSIS — Z0000 Encounter for general adult medical examination without abnormal findings: Secondary | ICD-10-CM

## 2016-06-14 NOTE — Progress Notes (Signed)
Care was provided under my supervision. I agree with the management as indicated in the note.  Ottis Sarnowski DO  

## 2016-06-14 NOTE — Patient Instructions (Addendum)
  Ms. Clinch , Thank you for taking time to come for your Medicare Wellness Visit. I appreciate your ongoing commitment to your health goals. Please review the following plan we discussed and let me know if I can assist you in the future.   Follow up with Dr. Derrel Nip as needed.    Consider Pneumovax 23 vaccine during next visit.  Bring a copy of your [place] and/or Living Will to be scanned into chart.  Have a great day!  These are the goals we discussed: Goals    . Healthy Lifestyle          Stay hydrated and drink plenty of fluids. Low carb foods.  Lean meats and vegetables. Stay active and walk for exercise as tolerated.       This is a list of the screening recommended for you and due dates:  Health Maintenance  Topic Date Due  . Tetanus Vaccine  08/31/1949  . Pneumonia vaccines (2 of 2 - PPSV23) 12/11/2015  . Flu Shot  04/05/2017*  . DEXA scan (bone density measurement)  Completed  *Topic was postponed. The date shown is not the original due date.

## 2016-06-14 NOTE — Progress Notes (Signed)
Subjective:   Frances Kent is a 83 y.o. female who presents for Medicare Annual (Subsequent) preventive examination.  Review of Systems:  No ROS.  Medicare Wellness Visit.  Cardiac Risk Factors include: advanced age (>71men, >17 women)     Objective:     Vitals: BP 118/62 (BP Location: Left Arm, Patient Position: Sitting, Cuff Size: Normal)   Pulse (!) 53   Temp 98 F (36.7 C) (Oral)   Resp 12   Ht 5\' 3"  (1.6 m)   Wt 133 lb 1.9 oz (60.4 kg)   SpO2 97%   BMI 23.58 kg/m   Body mass index is 23.58 kg/m.   Tobacco History  Smoking Status  . Never Smoker  Smokeless Tobacco  . Never Used    Comment: 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 , worked at [employer], [employer] given: Not Answered   Past Medical History:  Diagnosis Date  . Atrial fibrillation ([place])   . GERD (gastroesophageal reflux disease)   . IPF (idiopathic pulmonary fibrosis) ([place]) [address]11   by CT, PFTS done by Humphrey Rolls  . Mitral regurgitation   . Pulmonary embolism [facility])    Past Surgical History:  Procedure Laterality Date  . APPENDECTOMY  1960  . CARDIOVERSION N/A 04/05/2016   Procedure: Cardioversion;  Surgeon: [name], MD;  Location: [facility] ORS;  Service: Cardiovascular;  Laterality: N/A;  . CHOLECYSTECTOMY  1985  . OVARIAN CYST REMOVAL     Family History  Problem Relation Age of Onset  . Cancer Mother 84    breast cancer, lived to 64,   . Cancer Son 26    pancreatic cancer  . Heart disease Son     CAD, Tobacco Abuse   . Heart disease Maternal Grandmother 63    died of massive MI   History  Sexual Activity  . Sexual activity: Not Currently    Outpatient Encounter Prescriptions as of 06/14/2016  Medication Sig  . acidophilus (RISAQUAD) CAPS capsule Take 1 capsule by mouth daily. Reported on 04/20/2015  . bevacizumab (AVASTIN) 1.25 mg/0.1 mL SOLN by Intravitreal route every 6 (six) weeks. For macular degeneration  . Cholecalciferol (VITAMIN D3) 1000 units CAPS Take by mouth.  .  metoprolol tartrate (LOPRESSOR) 25 MG tablet Take 25 mg by mouth 2 (two) times daily.  . pantoprazole (PROTONIX) 40 MG tablet Take 1 tablet (40 mg total) by mouth daily as needed.  . Probiotic Product (PROBIOTIC ADVANCED) CAPS Take 1 capsule by mouth.  . [DISCONTINUED] benzonatate (TESSALON) 200 MG capsule Take 1 capsule (200 mg total) by mouth 3 (three) times daily as needed for cough.  . [DISCONTINUED] predniSONE (DELTASONE) 10 MG tablet 6 tablets on Day 1 , then reduce by 1 tablet daily until gone  . diazepam (VALIUM) 5 MG tablet Take 1 tablet (5 mg total) by mouth every 12 (twelve) hours as needed for anxiety or muscle spasms. (Patient not taking: Reported on 06/14/2016)  . rivaroxaban (XARELTO) 20 MG TABS tablet Take 20 mg by mouth daily.   No facility-administered encounter medications on file as of 06/14/2016.     Activities of Daily Living In your present state of health, do you have any difficulty performing the following activities: 06/14/2016 04/04/2016  Hearing? N -  Vision? N -  Difficulty concentrating or making decisions? N -  Walking or climbing stairs? N -  Dressing or bathing? N -  Doing errands, shopping? N N  Preparing Food and eating ? N -  Using  the Toilet? N -  In the past six months, have you accidently leaked urine? N -  Do you have problems with loss of bowel control? N -  Managing your Medications? N -  Managing your Finances? N -  Housekeeping or managing your Housekeeping? N -  Some recent data might be hidden    Patient Care Team: [name], MD as PCP - General (Internal Medicine)    Assessment:    This is a routine wellness examination for Frances Kent. The goal of the wellness visit is to assist the patient how to close the gaps in care and create a preventative care plan for the patient.   Taking calcium VIT D3 as appropriate/Osteoporosis risk reviewed.  Medications reviewed; taking without issues or barriers.  Safety issues reviewed; smoke  detectors in the home. No firearms in the home.  Wears seatbelts when driving or riding with others. Patient does wear sunscreen or protective clothing when in direct sunlight. No violence in the home.  Depression- PHQ 2 &9 complete.  No signs/symptoms or verbal communication regarding little pleasure in doing things, feeling down, depressed or hopeless. No changes in sleeping, energy, eating, concentrating.  No thoughts of self harm or harm towards others.  Time spent on this topic is 9 minutes.   Patient is alert, normal appearance, oriented to person/place/and time. Correctly identified the president of the [place], recall of 3/3 words, and performing simple calculations.  Patient displays appropriate judgement and can read correct time from watch face.  No new identified risk were noted.  No failures at ADL's or IADL's.   BMI- discussed the importance of a healthy diet, water intake and exercise. Educational material provided.   Dental- every 12 months. Full plate on top, partial on bottom.  Eye- Visual acuity not assessed per patient preference since they have regular follow up with the ophthalmologist.  Wears corrective lenses when reading.  Sleep patterns- Sleeps 5 hours at night.  Wakes feeling rested.  Naps during the day as needed.  Pneumococcal 23 and TDAP vaccine deferred per patient preference.  Educational material provided.  Patient Concerns: None at this time. Follow up with PCP as needed.  Exercise Activities and Dietary recommendations Current Exercise Habits: Home exercise routine, Type of exercise: walking, Time (Minutes): 20, Frequency (Times/Week): 4, Weekly Exercise (Minutes/Week): 80, Intensity: Mild  Goals    . Healthy Lifestyle          Stay hydrated and drink plenty of fluids. Low carb foods.  Lean meats and vegetables. Stay active and walk for exercise as tolerated.      Fall Risk Fall Risk  06/14/2016 11/23/2015 06/15/2015 04/06/2015 04/02/2014  Falls in the  past year? No No No No No   Depression Screen PHQ 2/9 Scores 06/14/2016 04/08/2016 11/23/2015 06/15/2015  PHQ - 2 Score 0 0 0 0  PHQ- 9 Score 0 - - -     Cognitive Function MMSE - Mini Mental State Exam 06/14/2016 06/15/2015  Orientation to time 5 5  Orientation to Place 5 5  Registration 3 3  Attention/ Calculation 5 5  Recall 3 3  Language- name 2 objects 2 2  Language- repeat 1 1  Language- follow 3 step command 3 3  Language- read & follow direction 1 1  Write a sentence 1 1  Copy design 1 1  Total score 30 30        Immunization History  Administered Date(s) Administered  . Pneumococcal Conjugate-13 12/11/2014  Screening Tests Health Maintenance  Topic Date Due  . TETANUS/TDAP  08/31/1949  . PNA vac Low Risk Adult (2 of 2 - PPSV23) 12/11/2015  . INFLUENZA VACCINE  04/05/2017 (Originally 09/14/2016)  . DEXA SCAN  Completed      Plan:    End of life planning; Advance aging; Advanced directives discussed. Copy of current HCPOA/Living Will requested.    I have personally reviewed and noted the following in the patient's chart:   . Medical and social history . Use of alcohol, tobacco or illicit drugs  . Current medications and supplements . Functional ability and status . Nutritional status . Physical activity . Advanced directives . List of other physicians . Hospitalizations, surgeries, and ER visits in previous 12 months . Vitals . Screenings to include cognitive, depression, and falls . Referrals and appointments  In addition, I have reviewed and discussed with patient certain preventive protocols, quality metrics, and best practice recommendations. A written personalized care plan for preventive services as well as general preventive health recommendations were provided to patient.     Varney Biles, LPN  08/22/9782

## 2016-06-24 DIAGNOSIS — H353221 Exudative age-related macular degeneration, left eye, with active choroidal neovascularization: Secondary | ICD-10-CM | POA: Diagnosis not present

## 2016-06-29 DIAGNOSIS — I071 Rheumatic tricuspid insufficiency: Secondary | ICD-10-CM | POA: Diagnosis not present

## 2016-06-29 DIAGNOSIS — I340 Nonrheumatic mitral (valve) insufficiency: Secondary | ICD-10-CM | POA: Diagnosis not present

## 2016-06-29 DIAGNOSIS — I480 Paroxysmal atrial fibrillation: Secondary | ICD-10-CM | POA: Diagnosis not present

## 2016-06-29 DIAGNOSIS — I050 Rheumatic mitral stenosis: Secondary | ICD-10-CM | POA: Diagnosis not present

## 2016-06-29 DIAGNOSIS — I10 Essential (primary) hypertension: Secondary | ICD-10-CM | POA: Diagnosis not present

## 2016-06-29 DIAGNOSIS — I2692 Saddle embolus of pulmonary artery without acute cor pulmonale: Secondary | ICD-10-CM | POA: Diagnosis not present

## 2016-06-29 DIAGNOSIS — R001 Bradycardia, unspecified: Secondary | ICD-10-CM | POA: Diagnosis not present

## 2016-06-29 DIAGNOSIS — E782 Mixed hyperlipidemia: Secondary | ICD-10-CM | POA: Diagnosis not present

## 2016-06-29 DIAGNOSIS — I2699 Other pulmonary embolism without acute cor pulmonale: Secondary | ICD-10-CM | POA: Diagnosis not present

## 2016-07-08 IMAGING — CR DG CERVICAL SPINE COMPLETE 4+V
1 series · 6 of 6 positions shown · non-contrast
Comparison: None.

CLINICAL DATA: Posterior neck pain for 1 week.  No injury.

EXAM:
CERVICAL SPINE - COMPLETE 4+ VIEW

[Series 1: dg cervical spine complete · 0.14mm/px · 6 of 6 slices shown]
[im 1/6]
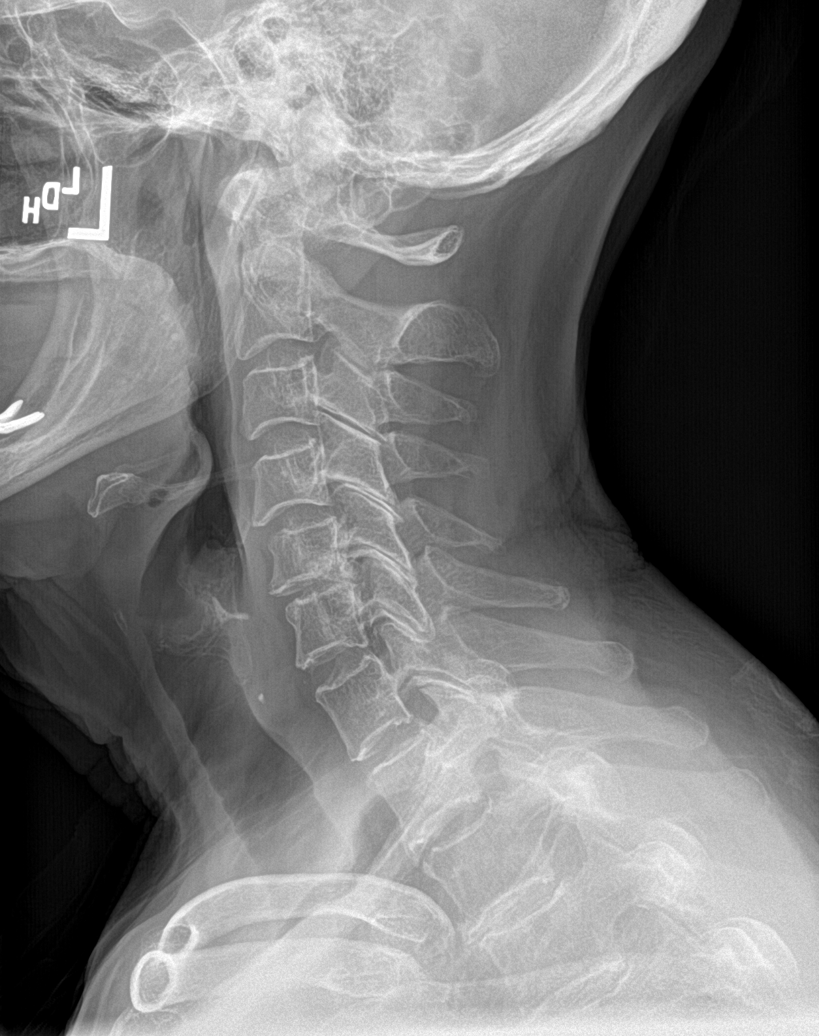
[im 2/6]
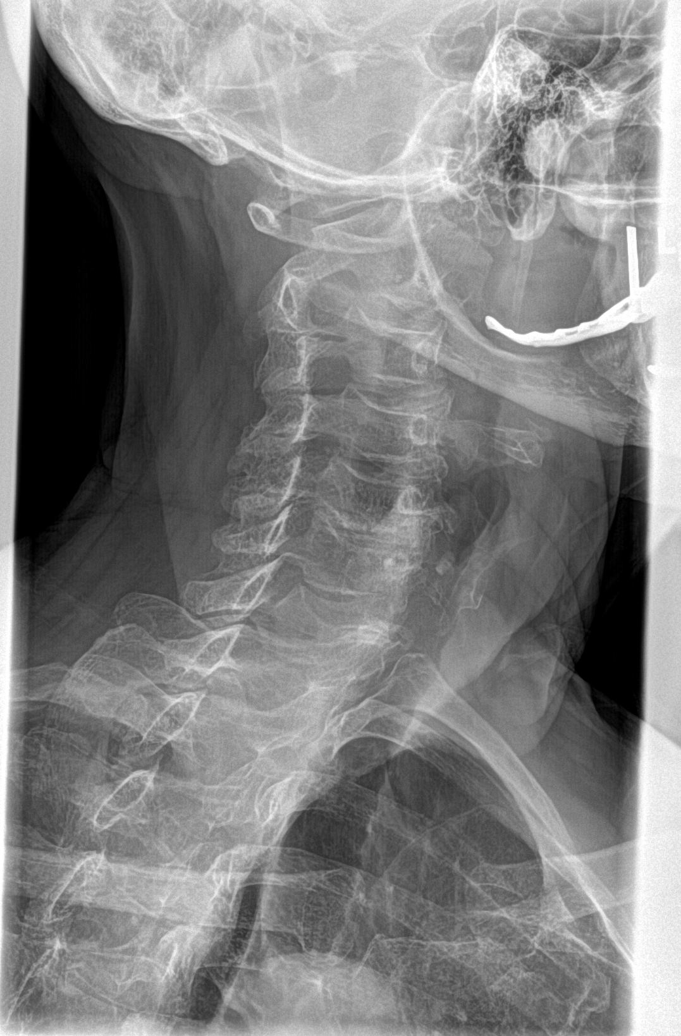
[im 3/6]
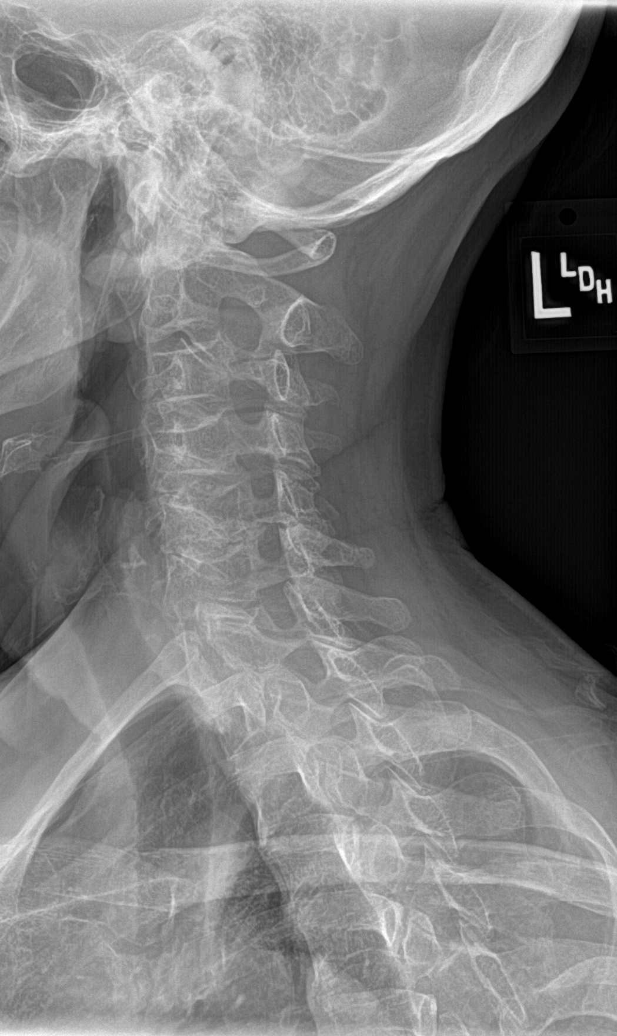
[im 4/6]
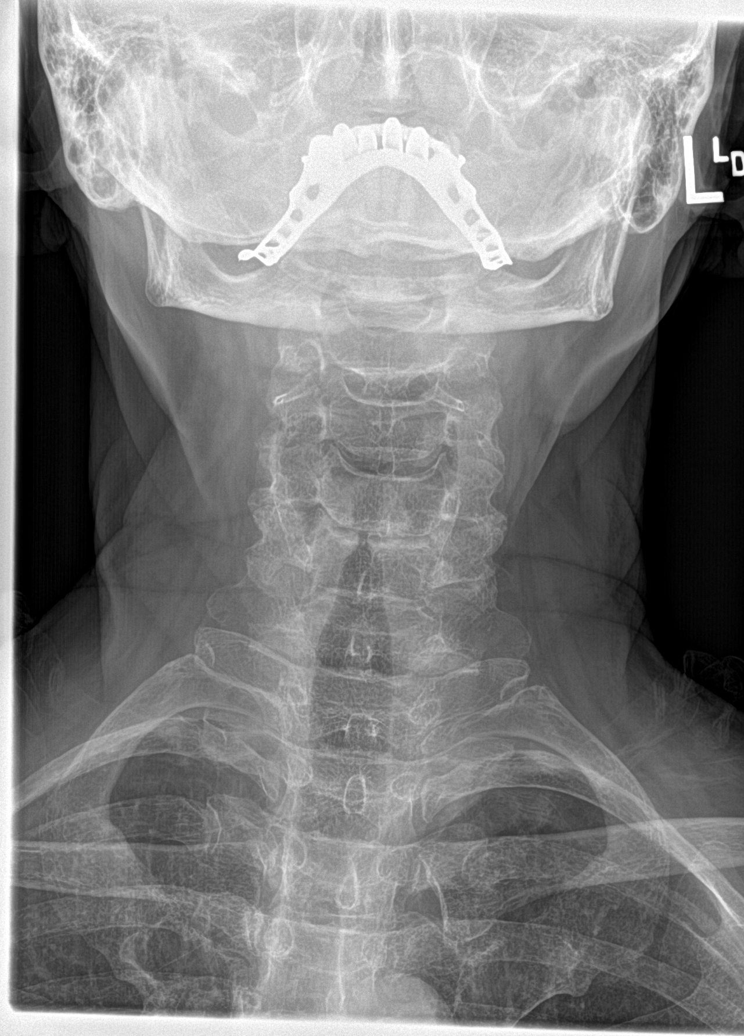
[im 5/6]
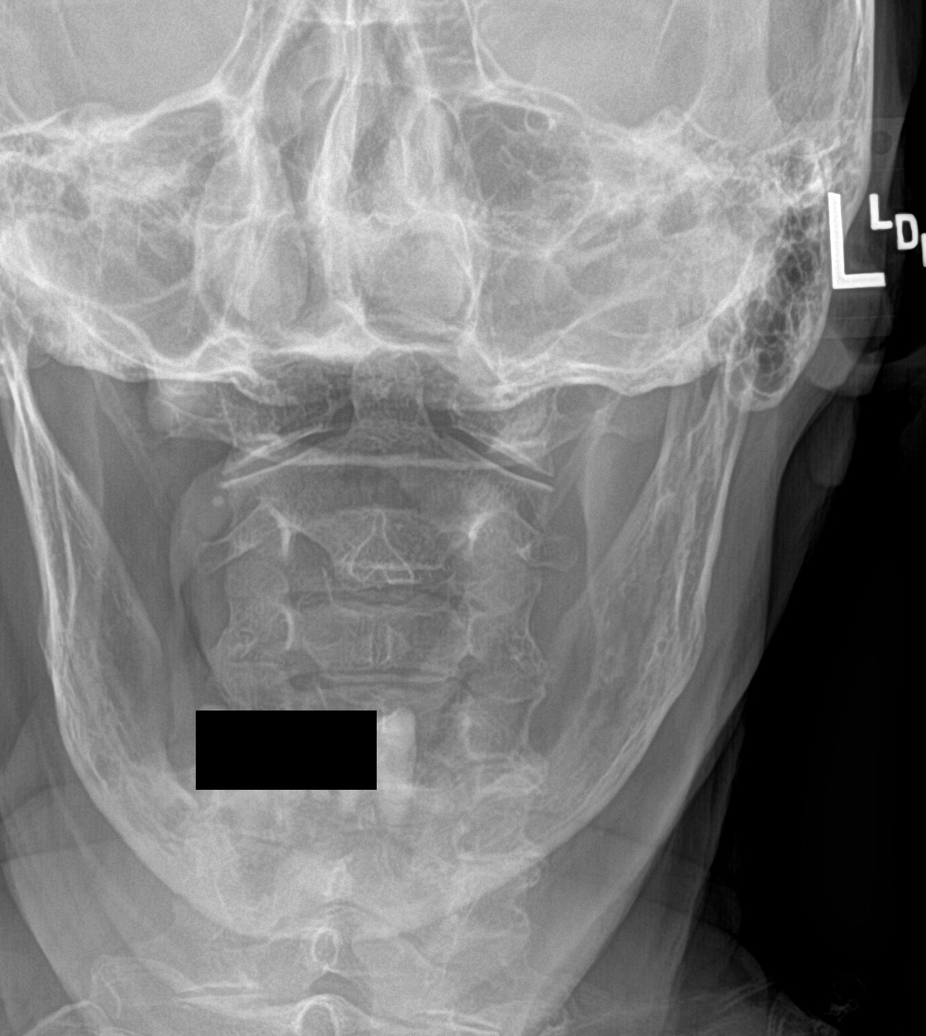
[im 6/6]
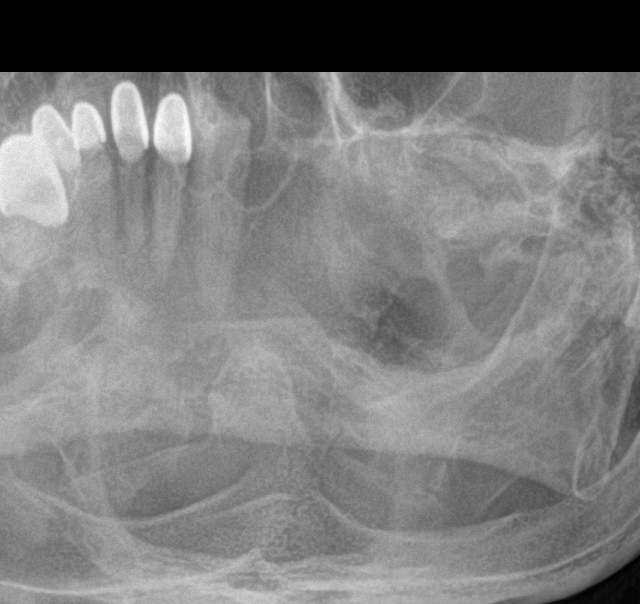

[6 of 6 positions shown; findings below may reference images not displayed]

FINDINGS: No fracture.  No spondylolisthesis.

Mild loss of disc height at C5-C6. Remaining discs are well
preserved in height.

Uncovertebral spurring on the left at C5-C6 causes mild left neural
foraminal narrowing. No other significant stenosis.

Mild facet degenerative changes noted bilaterally at C5-C6.

Bones are demineralized.  Soft tissues are unremarkable.
IMPRESSION: 1. Disc and facet degenerative changes at C5-C6 with mild left
neural foraminal narrowing.
2. No fracture.  No spondylolisthesis.

## 2016-07-13 ENCOUNTER — Ambulatory Visit (INDEPENDENT_AMBULATORY_CARE_PROVIDER_SITE_OTHER): Payer: Medicare HMO | Admitting: Internal Medicine

## 2016-07-13 ENCOUNTER — Encounter: Payer: Self-pay | Admitting: Internal Medicine

## 2016-07-13 VITALS — BP 118/66 | HR 47 | Temp 98.1°F | Resp 15 | Ht 63.0 in | Wt 134.4 lb

## 2016-07-13 DIAGNOSIS — I4891 Unspecified atrial fibrillation: Secondary | ICD-10-CM | POA: Diagnosis not present

## 2016-07-13 DIAGNOSIS — Z79899 Other long term (current) drug therapy: Secondary | ICD-10-CM

## 2016-07-13 DIAGNOSIS — E785 Hyperlipidemia, unspecified: Secondary | ICD-10-CM

## 2016-07-13 DIAGNOSIS — K2100 Gastro-esophageal reflux disease with esophagitis, without bleeding: Secondary | ICD-10-CM

## 2016-07-13 DIAGNOSIS — E559 Vitamin D deficiency, unspecified: Secondary | ICD-10-CM

## 2016-07-13 DIAGNOSIS — Z86711 Personal history of pulmonary embolism: Secondary | ICD-10-CM | POA: Diagnosis not present

## 2016-07-13 DIAGNOSIS — Z1231 Encounter for screening mammogram for malignant neoplasm of breast: Secondary | ICD-10-CM

## 2016-07-13 DIAGNOSIS — Z1239 Encounter for other screening for malignant neoplasm of breast: Secondary | ICD-10-CM

## 2016-07-13 DIAGNOSIS — Z0000 Encounter for general adult medical examination without abnormal findings: Secondary | ICD-10-CM

## 2016-07-13 DIAGNOSIS — J84112 Idiopathic pulmonary fibrosis: Secondary | ICD-10-CM

## 2016-07-13 DIAGNOSIS — Z23 Encounter for immunization: Secondary | ICD-10-CM | POA: Diagnosis not present

## 2016-07-13 LAB — CBC WITH DIFFERENTIAL/PLATELET
BASOS PCT: 2.3 % (ref 0.0–3.0)
Basophils Absolute: 0.2 10*3/uL — ABNORMAL HIGH (ref 0.0–0.1)
EOS ABS: 0.1 10*3/uL (ref 0.0–0.7)
Eosinophils Relative: 1.6 % (ref 0.0–5.0)
HEMATOCRIT: 38.1 % (ref 36.0–46.0)
Hemoglobin: 12.6 g/dL (ref 12.0–15.0)
LYMPHS ABS: 2.1 10*3/uL (ref 0.7–4.0)
LYMPHS PCT: 30.9 % (ref 12.0–46.0)
MCHC: 33.1 g/dL (ref 30.0–36.0)
MCV: 88.8 fl (ref 78.0–100.0)
MONOS PCT: 10.8 % (ref 3.0–12.0)
Monocytes Absolute: 0.7 10*3/uL (ref 0.1–1.0)
NEUTROS ABS: 3.7 10*3/uL (ref 1.4–7.7)
NEUTROS PCT: 54.4 % (ref 43.0–77.0)
PLATELETS: 251 10*3/uL (ref 150.0–400.0)
RBC: 4.29 Mil/uL (ref 3.87–5.11)
RDW: 16 % — AB (ref 11.5–15.5)
WBC: 6.8 10*3/uL (ref 4.0–10.5)

## 2016-07-13 LAB — LIPID PANEL
CHOL/HDL RATIO: 3
Cholesterol: 256 mg/dL — ABNORMAL HIGH (ref 0–200)
HDL: 75 mg/dL (ref >39.00)
LDL Cholesterol: 158 mg/dL — ABNORMAL HIGH (ref 0–99)
NONHDL: 181.49
Triglycerides: 118 mg/dL (ref 0.0–149.0)
VLDL: 23.6 mg/dL (ref 0.0–40.0)

## 2016-07-13 LAB — COMPREHENSIVE METABOLIC PANEL
ALT: 12 U/L (ref 0–35)
AST: 15 U/L (ref 0–37)
Albumin: 4.2 g/dL (ref 3.5–5.2)
Alkaline Phosphatase: 71 U/L (ref 39–117)
BILIRUBIN TOTAL: 0.4 mg/dL (ref 0.2–1.2)
BUN: 12 mg/dL (ref 6–23)
CO2: 31 meq/L (ref 19–32)
Calcium: 9.4 mg/dL (ref 8.4–10.5)
Chloride: 105 mEq/L (ref 96–112)
Creatinine, Ser: 0.86 mg/dL (ref 0.40–1.20)
GFR: 66.84 mL/min (ref >60.00)
GLUCOSE: 91 mg/dL (ref 70–99)
Potassium: 4.3 mEq/L (ref 3.5–5.1)
SODIUM: 139 meq/L (ref 135–145)
TOTAL PROTEIN: 6.7 g/dL (ref 6.0–8.3)

## 2016-07-13 LAB — VITAMIN D 25 HYDROXY (VIT D DEFICIENCY, FRACTURES): VITD: 27.17 ng/mL — ABNORMAL LOW (ref 30.00–100.00)

## 2016-07-13 MED ORDER — METOPROLOL TARTRATE 25 MG PO TABS: 12.5000 mg | ORAL_TABLET | Freq: Two times a day (BID) | ORAL | 5 refills | Status: DC

## 2016-07-13 MED ORDER — TETANUS-DIPHTH-ACELL PERTUSSIS 5-2.5-18.5 LF-MCG/0.5 IM SUSP: 0.5000 mL | Freq: Once | INTRAMUSCULAR | 0 refills | Status: AC

## 2016-07-13 NOTE — Progress Notes (Signed)
Patient ID: Frances Kent, female    DOB: 01-30-1931  Age: 83 y.o. MRN: [id number]  The patient is here for annual physical  examination and management of other chronic and acute problems.   The risk factors are reflected in the social history.  The roster of all physicians providing medical care to patient - is listed in the Snapshot section of the chart.  Activities of daily living:  The patient is 100% independent in all ADLs: dressing, toileting, feeding as well as independent mobility  Home safety : The patient has smoke detectors in the home. They wear seatbelts.  There are no firearms at home. There is no violence in the home.   There is no risks for hepatitis, STDs or HIV. There is no   history of blood transfusion. They have no travel history to infectious disease endemic areas of the world.  The patient has seen their dentist in the last six month. They have seen their eye doctor in the last year. They admit to slight hearing difficulty with regard to whispered voices and some television programs.  They have deferred audiologic testing in the last year.  They do not  have excessive sun exposure. Discussed the need for sun protection: hats, long sleeves and use of sunscreen if there is significant sun exposure.   Diet: the importance of a healthy diet is discussed. They do have a healthy diet.  The benefits of regular aerobic exercise were discussed. She walks 4 times per week ,  20 minutes.   Depression screen: there are no signs or vegative symptoms of depression- irritability, change in appetite, anhedonia, sadness/tearfullness.  Cognitive assessment: the patient manages all their financial and personal affairs and is actively engaged. They could relate day,date,year and events; recalled 2/3 objects at 3 minutes; performed clock-face test normally.  The following portions of the patient's history were reviewed and updated as appropriate: allergies, current medications, past  family history, past medical history,  past surgical history, past social history  and problem list.  Visual acuity was not assessed per patient preference since she has regular follow up with her ophthalmologist. Hearing and body mass index were assessed and reviewed.   During the course of the visit the patient was educated and counseled about appropriate screening and preventive services including : fall prevention , diabetes screening, nutrition counseling, colorectal cancer screening, and recommended immunizations.    CC: The primary encounter diagnosis was Vitamin D deficiency. Diagnoses of Hyperlipidemia LDL goal <160, Long-term use of high-risk medication, Breast cancer screening, Need for 23-polyvalent pneumococcal polysaccharide vaccine, Encounter for preventive health examination, History of pulmonary embolism, Atrial fibrillation status post cardioversion ([place]), Gastroesophageal reflux disease with esophagitis, and IPF (idiopathic pulmonary fibrosis) ([place]) were also pertinent to this visit.   Atrial fibrillation : Home pulse readings in the 50's most of the time , occasionally in the 60's..  Feels fatigue less than 50% of the time.  Still does her own  Housework.  Does not get short of breath . Not in a hurry, no trouble going up and down steps,  Just takes her time .  No falls,  No dizzy spells of presyncope    Taking xarelto without bleeding .  Stools are brown .     [name] has a past medical history of Atrial fibrillation ([place]); GERD (gastroesophageal reflux disease); IPF (idiopathic pulmonary fibrosis) ([place]) ([address]); Mitral regurgitation; and Pulmonary embolism ([place]).   She has a past surgical history that includes Appendectomy (1960);  Cholecystectomy (1985); Ovarian cyst removal; and Cardioversion (N/A, 04/05/2016).   Her family history includes Cancer (age of onset: 25) in her son; Cancer (age of onset: 77) in her mother; Heart disease in her son; Heart disease (age of  onset: 34) in her maternal grandmother.She reports that she has never smoked. She has never used smokeless tobacco. She reports that she does not drink alcohol or use drugs.  Outpatient Medications Prior to Visit  Medication Sig Dispense Refill  . acidophilus (RISAQUAD) CAPS capsule Take 1 capsule by mouth daily. Reported on 04/20/2015    . bevacizumab (AVASTIN) 1.25 mg/0.1 mL SOLN by Intravitreal route every 6 (six) weeks. For macular degeneration    . Cholecalciferol (VITAMIN D3) 1000 units CAPS Take by mouth.    . pantoprazole (PROTONIX) 40 MG tablet Take 1 tablet (40 mg total) by mouth daily as needed. 90 tablet 3  . Probiotic Product (PROBIOTIC ADVANCED) CAPS Take 1 capsule by mouth.    . rivaroxaban (XARELTO) 20 MG TABS tablet Take 20 mg by mouth daily.    . diazepam (VALIUM) 5 MG tablet Take 1 tablet (5 mg total) by mouth every 12 (twelve) hours as needed for anxiety or muscle spasms. (Patient not taking: Reported on 06/14/2016) 60 tablet 1  . metoprolol tartrate (LOPRESSOR) 25 MG tablet Take 25 mg by mouth 2 (two) times daily.     No facility-administered medications prior to visit.     Review of Systems   Patient denies headache, fevers, malaise, unintentional weight loss, skin rash, eye pain, sinus congestion and sinus pain, sore throat, dysphagia,  hemoptysis , cough, dyspnea, wheezing, chest pain, palpitations, orthopnea, edema, abdominal pain, nausea, melena, diarrhea, constipation, flank pain, dysuria, hematuria, urinary  Frequency, nocturia, numbness, tingling, seizures,  Focal weakness, Loss of consciousness,  Tremor, insomnia, depression, anxiety, and suicidal ideation.      Objective:  BP 118/66 (BP Location: Left Arm, Patient Position: Sitting, Cuff Size: Normal)   Pulse (!) 47   Temp 98.1 F (36.7 C) (Oral)   Resp 15   Ht 5\' 3"  (1.6 m)   Wt 134 lb 6.4 oz (61 kg)   SpO2 97%   BMI 23.81 kg/m   Physical Exam   General appearance: alert, cooperative and appears stated  age Head: Normocephalic, without obvious abnormality, atraumatic Eyes: conjunctivae/corneas clear. PERRL, EOM's intact. Fundi benign. Ears: normal TM's and external ear canals both ears Nose: Nares normal. Septum midline. Mucosa normal. No drainage or sinus tenderness. Throat: lips, mucosa, and tongue normal; teeth and gums normal Neck: no adenopathy, no carotid bruit, no JVD, supple, symmetrical, trachea midline and thyroid not enlarged, symmetric, no tenderness/mass/nodules Lungs: clear to auscultation bilaterally Breasts: normal appearance, no masses or tenderness Heart: bradycardic , irreg irreg, S1, S2 normal, no murmur, click, rub or gallop Abdomen: soft, non-tender; bowel sounds normal; no masses,  no organomegaly Extremities: extremities normal, atraumatic, no cyanosis or edema Pulses: 2+ and symmetric Skin: Skin color, texture, turgor normal. No rashes or lesions Neurologic: Alert and oriented X 3, normal strength and tone. Normal symmetric reflexes. Normal coordination and gait.      Assessment & Plan:   Problem List Items Addressed This Visit    Vitamin D deficiency - Primary   Relevant Orders   VITAMIN D 25 Hydroxy (Vit-D Deficiency, Fractures) (Completed)   IPF (idiopathic pulmonary fibrosis) ([place])    She remains asymptomatic with ADLs and room air pulse ox is normal  No further workup at this tinme.  Hyperlipidemia LDL goal <160    Untreated per patient preference.  Lab Results  Component Value Date   CHOL 256 (H) 07/13/2016   HDL 75.00 07/13/2016   LDLCALC 158 (H) 07/13/2016   LDLDIRECT 185.3 03/27/2013   TRIG 118.0 07/13/2016   CHOLHDL 3 07/13/2016         Relevant Medications   metoprolol tartrate (LOPRESSOR) 25 MG tablet   Other Relevant Orders   Lipid panel (Completed)   History of pulmonary embolism    Unprovoked  .  She is tolerating anticoagulation (lifelong)      GERD (gastroesophageal reflux disease)    Managed with PPI since 2012 ER  visit for severe esophagitis .       Encounter for preventive health examination    Annual comprehensive preventive exam was done as well as an evaluation and management of chronic conditions .  During the course of the visit the patient was educated and counseled about appropriate screening and preventive services including :  diabetes screening, lipid analysis with projected  10 year  risk for CAD , nutrition counseling, breast, cervical and colorectal cancer screening, and recommended immunizations.  Printed recommendations for health maintenance screenings was given      Atrial fibrillation status post cardioversion [facility])    She is bradycardic on metoprolol, chronically,  But is tolerating the rate.  Continue Xarelto ,  Advised to contact cardiologist if she becomes excessivel fatigued or presyncopal.       Relevant Medications   metoprolol tartrate (LOPRESSOR) 25 MG tablet    Other Visit Diagnoses    Long-term use of high-risk medication       Relevant Orders   Comprehensive metabolic panel (Completed)   CBC with Differential/Platelet (Completed)   Breast cancer screening       Relevant Orders   MM SCREENING BREAST TOMO BILATERAL   Need for 23-polyvalent pneumococcal polysaccharide vaccine       Relevant Orders   Pneumococcal polysaccharide vaccine 23-valent greater than or equal to 2yo subcutaneous/IM (Completed)      I have discontinued Ms. Marcom's diazepam and metoprolol tartrate. I have also changed her metoprolol tartrate. Additionally, I am having her start on Tdap. Lastly, I am having her maintain her acidophilus, bevacizumab, Vitamin D3, rivaroxaban, pantoprazole, and PROBIOTIC ADVANCED.  Meds ordered this encounter  Medications  . DISCONTD: metoprolol tartrate (LOPRESSOR) 25 MG tablet    Sig: Take 25 mg by mouth 2 (two) times daily.  . Tdap (BOOSTRIX) 5-2.5-18.5 LF-MCG/0.5 injection    Sig: Inject 0.5 mLs into the muscle once.    Dispense:  0.5 mL    Refill:  0   . metoprolol tartrate (LOPRESSOR) 25 MG tablet    Sig: Take 0.5 tablets (12.5 mg total) by mouth 2 (two) times daily.    Dispense:  30 tablet    Refill:  5    Medications Discontinued During This Encounter  Medication Reason  . metoprolol tartrate (LOPRESSOR) 25 MG tablet Patient has not taken in last 30 days  . diazepam (VALIUM) 5 MG tablet   . metoprolol tartrate (LOPRESSOR) 25 MG tablet Reorder    Follow-up: No Follow-up on file.   [name], MD

## 2016-07-13 NOTE — Patient Instructions (Addendum)
There are alternatives to metoprolol.  You are on the minimal dose and it is still may be too strong for you.  Please call your cardiologist if you start to feel like you are light headed,  Or IF your pulse STARTS TO DIP  under 50, Because they will change your metoprolol to cardizem,  A WEAKER MEDICATION,   YOU RECEIVED THE PNEUMOVAX VACCINE TODAY  Your still need your tetanus-diptheria-pertussis vaccine (TDaP) but you can get it for less $$$ at a local pharmacy with the script I have provided you.   The ShingRx vaccine is now available and IS ADVISED for all interested adults over 50 to prevent shingles .  I have ordered your mammogram    Health Maintenance for Postmenopausal Women Menopause is a normal process in which your reproductive ability comes to an end. This process happens gradually over a span of months to years, usually between the ages of 46 and 55. Menopause is complete when you have missed 12 consecutive menstrual periods. It is important to talk with your health care provider about some of the most common conditions that affect postmenopausal women, such as heart disease, cancer, and bone loss (osteoporosis). Adopting a healthy lifestyle and getting preventive care can help to promote your health and wellness. Those actions can also lower your chances of developing some of these common conditions. What should I know about menopause? During menopause, you may experience a number of symptoms, such as:  Moderate-to-severe hot flashes.  Night sweats.  Decrease in sex drive.  Mood swings.  Headaches.  Tiredness.  Irritability.  Memory problems.  Insomnia. Choosing to treat or not to treat menopausal changes is an individual decision that you make with your health care provider. What should I know about hormone replacement therapy and supplements? Hormone therapy products are effective for treating symptoms that are associated with menopause, such as hot flashes and  night sweats. Hormone replacement carries certain risks, especially as you become older. If you are thinking about using estrogen or estrogen with progestin treatments, discuss the benefits and risks with your health care provider. What should I know about heart disease and stroke? Heart disease, heart attack, and stroke become more likely as you age. This may be due, in part, to the hormonal changes that your body experiences during menopause. These can affect how your body processes dietary fats, triglycerides, and cholesterol. Heart attack and stroke are both medical emergencies. There are many things that you can do to help prevent heart disease and stroke:  Have your blood pressure checked at least every 1-2 years. High blood pressure causes heart disease and increases the risk of stroke.  If you are 71-95 years old, ask your health care provider if you should take aspirin to prevent a heart attack or a stroke.  Do not use any tobacco products, including cigarettes, chewing tobacco, or electronic cigarettes. If you need help quitting, ask your health care provider.  It is important to eat a healthy diet and maintain a healthy weight.  Be sure to include plenty of vegetables, fruits, low-fat dairy products, and lean protein.  Avoid eating foods that are high in solid fats, added sugars, or salt (sodium).  Get regular exercise. This is one of the most important things that you can do for your health.  Try to exercise for at least 150 minutes each week. The type of exercise that you do should increase your heart rate and make you sweat. This is known as moderate-intensity  exercise.  Try to do strengthening exercises at least twice each week. Do these in addition to the moderate-intensity exercise.  Know your numbers.Ask your health care provider to check your cholesterol and your blood glucose. Continue to have your blood tested as directed by your health care provider. What should I know  about cancer screening? There are several types of cancer. Take the following steps to reduce your risk and to catch any cancer development as early as possible. Breast Cancer  Practice breast self-awareness.  This means understanding how your breasts normally appear and feel.  It also means doing regular breast self-exams. Let your health care provider know about any changes, no matter how small.  If you are 35 or older, have a clinician do a breast exam (clinical breast exam or CBE) every year. Depending on your age, family history, and medical history, it may be recommended that you also have a yearly breast X-ray (mammogram).  If you have a family history of breast cancer, talk with your health care provider about genetic screening.  If you are at high risk for breast cancer, talk with your health care provider about having an MRI and a mammogram every year.  Breast cancer (BRCA) gene test is recommended for women who have family members with BRCA-related cancers. Results of the assessment will determine the need for genetic counseling and BRCA1 and for BRCA2 testing. BRCA-related cancers include these types:  Breast. This occurs in males or females.  Ovarian.  Tubal. This may also be called fallopian tube cancer.  Cancer of the abdominal or pelvic lining (peritoneal cancer).  Prostate.  Pancreatic. Cervical, Uterine, and Ovarian Cancer  Your health care provider may recommend that you be screened regularly for cancer of the pelvic organs. These include your ovaries, uterus, and vagina. This screening involves a pelvic exam, which includes checking for microscopic changes to the surface of your cervix (Pap test).  For women ages 21-65, health care providers may recommend a pelvic exam and a Pap test every three years. For women ages 50-65, they may recommend the Pap test and pelvic exam, combined with testing for human papilloma virus (HPV), every five years. Some types of HPV  increase your risk of cervical cancer. Testing for HPV may also be done on women of any age who have unclear Pap test results.  Other health care providers may not recommend any screening for nonpregnant women who are considered low risk for pelvic cancer and have no symptoms. Ask your health care provider if a screening pelvic exam is right for you.  If you have had past treatment for cervical cancer or a condition that could lead to cancer, you need Pap tests and screening for cancer for at least 20 years after your treatment. If Pap tests have been discontinued for you, your risk factors (such as having a new sexual partner) need to be reassessed to determine if you should start having screenings again. Some women have medical problems that increase the chance of getting cervical cancer. In these cases, your health care provider may recommend that you have screening and Pap tests more often.  If you have a family history of uterine cancer or ovarian cancer, talk with your health care provider about genetic screening.  If you have vaginal bleeding after reaching menopause, tell your health care provider.  There are currently no reliable tests available to screen for ovarian cancer. Lung Cancer  Lung cancer screening is recommended for adults 88-87 years old who  are at high risk for lung cancer because of a history of smoking. A yearly low-dose CT scan of the lungs is recommended if you:  Currently smoke.  Have a history of at least 30 pack-years of smoking and you currently smoke or have quit within the past 15 years. A pack-year is smoking an average of one pack of cigarettes per day for one year. Yearly screening should:  Continue until it has been 15 years since you quit.  Stop if you develop a health problem that would prevent you from having lung cancer treatment. Colorectal Cancer  This type of cancer can be detected and can often be prevented.  Routine colorectal cancer screening  usually begins at age 31 and continues through age 15.  If you have risk factors for colon cancer, your health care provider may recommend that you be screened at an earlier age.  If you have a family history of colorectal cancer, talk with your health care provider about genetic screening.  Your health care provider may also recommend using home test kits to check for hidden blood in your stool.  A small camera at the end of a tube can be used to examine your colon directly (sigmoidoscopy or colonoscopy). This is done to check for the earliest forms of colorectal cancer.  Direct examination of the colon should be repeated every 5-10 years until age 60. However, if early forms of precancerous polyps or small growths are found or if you have a family history or genetic risk for colorectal cancer, you may need to be screened more often. Skin Cancer  Check your skin from head to toe regularly.  Monitor any moles. Be sure to tell your health care provider:  About any new moles or changes in moles, especially if there is a change in a mole's shape or color.  If you have a mole that is larger than the size of a pencil eraser.  If any of your family members has a history of skin cancer, especially at a young age, talk with your health care provider about genetic screening.  Always use sunscreen. Apply sunscreen liberally and repeatedly throughout the day.  Whenever you are outside, protect yourself by wearing long sleeves, pants, a wide-brimmed hat, and sunglasses. What should I know about osteoporosis? Osteoporosis is a condition in which bone destruction happens more quickly than new bone creation. After menopause, you may be at an increased risk for osteoporosis. To help prevent osteoporosis or the bone fractures that can happen because of osteoporosis, the following is recommended:  If you are 70-64 years old, get at least 1,000 mg of calcium and at least 600 mg of vitamin D per day.  If  you are older than age 77 but younger than age 80, get at least 1,200 mg of calcium and at least 600 mg of vitamin D per day.  If you are older than age 2, get at least 1,200 mg of calcium and at least 800 mg of vitamin D per day. Smoking and excessive alcohol intake increase the risk of osteoporosis. Eat foods that are rich in calcium and vitamin D, and do weight-bearing exercises several times each week as directed by your health care provider. What should I know about how menopause affects my mental health? Depression may occur at any age, but it is more common as you become older. Common symptoms of depression include:  Low or sad mood.  Changes in sleep patterns.  Changes in appetite or eating  patterns.  Feeling an overall lack of motivation or enjoyment of activities that you previously enjoyed.  Frequent crying spells. Talk with your health care provider if you think that you are experiencing depression. What should I know about immunizations? It is important that you get and maintain your immunizations. These include:  Tetanus, diphtheria, and pertussis (Tdap) booster vaccine.  Influenza every year before the flu season begins.  Pneumonia vaccine.  Shingles vaccine. Your health care provider may also recommend other immunizations. This information is not intended to replace advice given to you by your health care provider. Make sure you discuss any questions you have with your health care provider. Document Released: 03/25/2005 Document Revised: 08/21/2015 Document Reviewed: 11/04/2014 Elsevier Interactive Patient Education  2017 [employer].

## 2016-07-15 ENCOUNTER — Encounter: Payer: Self-pay | Admitting: *Deleted

## 2016-07-16 NOTE — Assessment & Plan Note (Signed)
Annual comprehensive preventive exam was done as well as an evaluation and management of chronic conditions .  During the course of the visit the patient was educated and counseled about appropriate screening and preventive services including :  diabetes screening, lipid analysis with projected  10 year  risk for CAD , nutrition counseling, breast, cervical and colorectal cancer screening, and recommended immunizations.  Printed recommendations for health maintenance screenings was given 

## 2016-07-16 NOTE — Assessment & Plan Note (Signed)
Managed with PPI since 2012 ER visit for severe esophagitis .

## 2016-07-16 NOTE — Assessment & Plan Note (Signed)
She remains asymptomatic with ADLs and room air pulse ox is normal  No further workup at this tinme.

## 2016-07-16 NOTE — Assessment & Plan Note (Signed)
Unprovoked  .  She is tolerating anticoagulation (lifelong)

## 2016-07-16 NOTE — Assessment & Plan Note (Signed)
She is bradycardic on metoprolol, chronically,  But is tolerating the rate.  Continue Xarelto ,  Advised to contact cardiologist if she becomes excessivel fatigued or presyncopal.

## 2016-07-16 NOTE — Assessment & Plan Note (Signed)
Untreated per patient preference.  Lab Results  Component Value Date   CHOL 256 (H) 07/13/2016   HDL 75.00 07/13/2016   LDLCALC 158 (H) 07/13/2016   LDLDIRECT 185.3 03/27/2013   TRIG 118.0 07/13/2016   CHOLHDL 3 07/13/2016

## 2016-08-23 ENCOUNTER — Ambulatory Visit
Admission: RE | Admit: 2016-08-23 | Discharge: 2016-08-23 | Disposition: A | Discharge status: Home / Self Care | Payer: Medicare HMO | Source: Ambulatory Visit | Attending: Internal Medicine | Admitting: Internal Medicine

## 2016-08-23 DIAGNOSIS — R928 Other abnormal and inconclusive findings on diagnostic imaging of breast: Secondary | ICD-10-CM | POA: Diagnosis not present

## 2016-08-23 DIAGNOSIS — Z1231 Encounter for screening mammogram for malignant neoplasm of breast: Secondary | ICD-10-CM | POA: Diagnosis not present

## 2016-08-23 DIAGNOSIS — Z1239 Encounter for other screening for malignant neoplasm of breast: Secondary | ICD-10-CM

## 2016-08-24 ENCOUNTER — Other Ambulatory Visit: Payer: Self-pay | Admitting: Internal Medicine

## 2016-08-24 DIAGNOSIS — N6320 Unspecified lump in the left breast, unspecified quadrant: Secondary | ICD-10-CM

## 2016-08-24 DIAGNOSIS — R928 Other abnormal and inconclusive findings on diagnostic imaging of breast: Secondary | ICD-10-CM

## 2016-08-26 DIAGNOSIS — H353221 Exudative age-related macular degeneration, left eye, with active choroidal neovascularization: Secondary | ICD-10-CM | POA: Diagnosis not present

## 2016-08-29 ENCOUNTER — Telehealth: Payer: Self-pay | Admitting: Internal Medicine

## 2016-08-29 NOTE — Telephone Encounter (Signed)
Per Aldona Bar at [facility] [place] order needs to be signed. Thank you!

## 2016-08-29 NOTE — Telephone Encounter (Signed)
Can you sign the orders. Thank you.

## 2016-08-31 ENCOUNTER — Ambulatory Visit
Admission: RE | Admit: 2016-08-31 | Discharge: 2016-08-31 | Disposition: A | Discharge status: Home / Self Care | Payer: Medicare HMO | Source: Ambulatory Visit | Attending: Internal Medicine | Admitting: Internal Medicine

## 2016-08-31 DIAGNOSIS — N6489 Other specified disorders of breast: Secondary | ICD-10-CM | POA: Diagnosis not present

## 2016-08-31 DIAGNOSIS — N6320 Unspecified lump in the left breast, unspecified quadrant: Secondary | ICD-10-CM

## 2016-08-31 DIAGNOSIS — R928 Other abnormal and inconclusive findings on diagnostic imaging of breast: Secondary | ICD-10-CM

## 2016-08-31 DIAGNOSIS — N6322 Unspecified lump in the left breast, upper inner quadrant: Secondary | ICD-10-CM | POA: Insufficient documentation

## 2016-09-06 ENCOUNTER — Other Ambulatory Visit: Payer: Self-pay | Admitting: Internal Medicine

## 2016-09-06 DIAGNOSIS — N6320 Unspecified lump in the left breast, unspecified quadrant: Secondary | ICD-10-CM

## 2016-10-03 ENCOUNTER — Telehealth: Payer: Self-pay | Admitting: General Surgery

## 2016-10-03 DIAGNOSIS — I050 Rheumatic mitral stenosis: Secondary | ICD-10-CM | POA: Diagnosis not present

## 2016-10-03 DIAGNOSIS — I10 Essential (primary) hypertension: Secondary | ICD-10-CM | POA: Diagnosis not present

## 2016-10-03 DIAGNOSIS — R001 Bradycardia, unspecified: Secondary | ICD-10-CM | POA: Diagnosis not present

## 2016-10-03 DIAGNOSIS — I071 Rheumatic tricuspid insufficiency: Secondary | ICD-10-CM | POA: Diagnosis not present

## 2016-10-03 DIAGNOSIS — I340 Nonrheumatic mitral (valve) insufficiency: Secondary | ICD-10-CM | POA: Diagnosis not present

## 2016-10-03 DIAGNOSIS — I2692 Saddle embolus of pulmonary artery without acute cor pulmonale: Secondary | ICD-10-CM | POA: Diagnosis not present

## 2016-10-03 DIAGNOSIS — I480 Paroxysmal atrial fibrillation: Secondary | ICD-10-CM | POA: Diagnosis not present

## 2016-10-03 DIAGNOSIS — E782 Mixed hyperlipidemia: Secondary | ICD-10-CM | POA: Diagnosis not present

## 2016-10-03 NOTE — Telephone Encounter (Signed)
The patient should continue all of her regular medications. This includes Xaralto.

## 2016-10-03 NOTE — Telephone Encounter (Signed)
I CALLED PATIENT TO MAKE HER AN APPOINTMENT WITH DR BYRNETT LEFT BR MASS,CAT 4 MAMMO & U/S DONE @ [facility](REF'D BY DR Derrel Nip).SHE IS TAKING XARELTO FOR UNPROVOKED PE IN 2016 PER DR TULLO.PATIENT STATES SHE'S TAKING METOPROL 25MG S DAILY.DOES SHE NEED TO COME OFF THESE PRIOR TO HER APPOINTMENT ON Thursday 10-06-16 @ 8:30AM? PLEASE ADVISE.

## 2016-10-04 ENCOUNTER — Encounter: Payer: Self-pay | Admitting: *Deleted

## 2016-10-06 ENCOUNTER — Ambulatory Visit (INDEPENDENT_AMBULATORY_CARE_PROVIDER_SITE_OTHER): Payer: Medicare HMO | Admitting: General Surgery

## 2016-10-06 ENCOUNTER — Inpatient Hospital Stay: Payer: Self-pay

## 2016-10-06 ENCOUNTER — Encounter: Payer: Self-pay | Admitting: General Surgery

## 2016-10-06 VITALS — BP 118/64 | HR 79 | Resp 16 | Ht 63.0 in | Wt 137.0 lb

## 2016-10-06 DIAGNOSIS — N6322 Unspecified lump in the left breast, upper inner quadrant: Secondary | ICD-10-CM

## 2016-10-06 DIAGNOSIS — N6002 Solitary cyst of left breast: Secondary | ICD-10-CM

## 2016-10-06 NOTE — Patient Instructions (Signed)
The patient is aware to call back for any questions or concerns.  

## 2016-10-06 NOTE — Progress Notes (Signed)
Patient ID: Frances Kent, female   DOB: [birth year], 83 y.o.   MRN: [id number]  Chief Complaint  Patient presents with  . Breast Problem    left breast mass    HPI Frances Kent is a 83 y.o. female.  who presents for a breast evaluation referred by Dr Derrel Nip. The most recent mammogram was done on 08-23-16. Left breast ultrasound was 08-31-16. Patient does not perform regular self breast checks and gets regular mammograms done.  She can not feel anything different in the breast.   HPI  Past Medical History:  Diagnosis Date  . Atrial fibrillation ([place])   . GERD (gastroesophageal reflux disease)   . IPF (idiopathic pulmonary fibrosis) ([place]) 20111   by CT, PFTS done by Humphrey Rolls  . Mitral regurgitation   . Pulmonary embolism ([place]) 02/2016    Past Surgical History:  Procedure Laterality Date  . APPENDECTOMY  1960  . CARDIOVERSION N/A 04/05/2016   Procedure: Cardioversion;  Surgeon: [name], MD;  Location: [facility] ORS;  Service: Cardiovascular;  Laterality: N/A;  . CHOLECYSTECTOMY  1985  . OVARIAN CYST REMOVAL      Family History  Problem Relation Age of Onset  . Cancer Mother 76       breast cancer, lived to 72,   . Breast cancer Mother 18  . Cancer Son 45       pancreatic cancer  . Heart disease Son        CAD, Tobacco Abuse   . Heart disease Maternal Grandmother 80       died of massive MI    Social History Social History  Substance Use Topics  . Smoking status: Never Smoker  . Smokeless tobacco: Never Used     Comment: 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 , worked at [employer], [employer]  . Alcohol use No    No Known Allergies  Current Outpatient Prescriptions  Medication Sig Dispense Refill  . acidophilus (RISAQUAD) CAPS capsule Take 1 capsule by mouth daily. Reported on 04/20/2015    . bevacizumab (AVASTIN) 1.25 mg/0.1 mL SOLN by Intravitreal route every 6 (six) weeks. For macular degeneration    . Cholecalciferol (VITAMIN D3) 1000 units CAPS Take by mouth.    . metoprolol  tartrate (LOPRESSOR) 25 MG tablet Take 0.5 tablets (12.5 mg total) by mouth 2 (two) times daily. 30 tablet 5  . pantoprazole (PROTONIX) 40 MG tablet Take 1 tablet (40 mg total) by mouth daily as needed. 90 tablet 3  . Probiotic Product (PROBIOTIC ADVANCED) CAPS Take 1 capsule by mouth.    . rivaroxaban (XARELTO) 20 MG TABS tablet Take 20 mg by mouth daily.     No current facility-administered medications for this visit.     Review of Systems Review of Systems  Constitutional: Negative.   Respiratory: Negative.   Cardiovascular: Negative.     Blood pressure 118/64, pulse 79, resp. rate 16, height 5\' 3"  (1.6 m), weight 137 lb (62.1 kg), SpO2 98 %.  Physical Exam Physical Exam  Constitutional: She is oriented to person, place, and time. She appears well-developed and well-nourished.  HENT:  Mouth/Throat: Oropharynx is clear and moist.  Eyes: Conjunctivae are normal. No scleral icterus.  Neck: Neck supple.  Cardiovascular: Normal rate, regular rhythm and normal heart sounds.   Pulmonary/Chest: Effort normal and breath sounds normal. Right breast exhibits no inverted nipple, no mass, no nipple discharge, no skin change and no tenderness. Left breast exhibits no inverted nipple, no mass, no nipple discharge, no skin  change and no tenderness.  Lymphadenopathy:    She has no cervical adenopathy.    She has no axillary adenopathy.  Neurological: She is alert and oriented to person, place, and time.  Skin: Skin is warm and dry.  Psychiatric: Her behavior is normal.    Data Reviewed Bilateral screening mammograms of 08/23/2016, left breast diagnostic mammogram and ultrasound of 08/31/2016 reviewed and compared to previous studies. BI-RADS 4. Pertinent findings noted below:   Targeted ultrasound is performed, showing an oval, circumscribed, hypoechoic mass at 11 o'clock, 4 cm from the nipple measuring 5 x 4 x 5 mm, corresponding to the mass seen mammographically. No internal vascularity  is seen. The mass is thought to likely represent a minimally complicated cyst.  IMPRESSION: Probable minimally complicated cyst within the left breast at 11 o'clock.  RECOMMENDATION: Ultrasound-guided aspiration of the probable left breast cyst at 11 o'clock. If unable to fully aspirate, ultrasound guided core biopsy is recommended.  Ultrasound examination of the left breast was completed with identification of a clearly anechoic mass measuring 0.4 x 0.43 x 0.47 cm. No internal vascular flow. The patient was amenable to aspiration. 1 mL of 1% plain Xylocaine was utilized. Making use of a lateral approach the area was punctured with a 22-gauge needle with complete resolution on aspiration. A small amount of clear fluid was obtained and discarded. BI-RADS-2.  Assessment    Asymptomatic left breast cyst, resolved on aspiration.    Plan    With the radiologist report describing concourse between mammographic and ultrasound findings, a post aspiration mammogram will not be obtained.  Patient should resume annual screening mammograms in July 2019.    Follow up as needed. The patient is aware to call back for any questions or new concerns.   HPI, Physical Exam, Assessment and Plan have been scribed under the direction and in the presence of Robert Bellow, MD. Karie Fetch, RN  I have completed the exam and reviewed the above documentation for accuracy and completeness.  I agree with the above.  Haematologist has been used and any errors in dictation or transcription are unintentional.  Hervey Ard, M.D., F.A.C.S.  Robert Bellow 10/07/2016, 7:21 AM

## 2016-10-07 DIAGNOSIS — N6002 Solitary cyst of left breast: Secondary | ICD-10-CM | POA: Insufficient documentation

## 2016-10-07 DIAGNOSIS — H353221 Exudative age-related macular degeneration, left eye, with active choroidal neovascularization: Secondary | ICD-10-CM | POA: Diagnosis not present

## 2016-10-07 HISTORY — DX: Solitary cyst of left breast: N60.02

## 2016-12-20 DIAGNOSIS — H353221 Exudative age-related macular degeneration, left eye, with active choroidal neovascularization: Secondary | ICD-10-CM | POA: Diagnosis not present

## 2016-12-20 DIAGNOSIS — H353212 Exudative age-related macular degeneration, right eye, with inactive choroidal neovascularization: Secondary | ICD-10-CM | POA: Diagnosis not present

## 2017-01-30 DIAGNOSIS — I272 Pulmonary hypertension, unspecified: Secondary | ICD-10-CM | POA: Diagnosis not present

## 2017-01-30 DIAGNOSIS — I2699 Other pulmonary embolism without acute cor pulmonale: Secondary | ICD-10-CM | POA: Diagnosis not present

## 2017-01-30 DIAGNOSIS — I10 Essential (primary) hypertension: Secondary | ICD-10-CM | POA: Diagnosis not present

## 2017-01-30 DIAGNOSIS — I480 Paroxysmal atrial fibrillation: Secondary | ICD-10-CM | POA: Diagnosis not present

## 2017-02-13 DIAGNOSIS — H353221 Exudative age-related macular degeneration, left eye, with active choroidal neovascularization: Secondary | ICD-10-CM | POA: Diagnosis not present

## 2017-03-23 DIAGNOSIS — H40003 Preglaucoma, unspecified, bilateral: Secondary | ICD-10-CM | POA: Diagnosis not present

## 2017-03-28 DIAGNOSIS — H353221 Exudative age-related macular degeneration, left eye, with active choroidal neovascularization: Secondary | ICD-10-CM | POA: Diagnosis not present

## 2017-05-19 ENCOUNTER — Other Ambulatory Visit: Payer: Self-pay | Admitting: Internal Medicine

## 2017-05-23 DIAGNOSIS — H353221 Exudative age-related macular degeneration, left eye, with active choroidal neovascularization: Secondary | ICD-10-CM | POA: Diagnosis not present

## 2017-06-15 ENCOUNTER — Ambulatory Visit (INDEPENDENT_AMBULATORY_CARE_PROVIDER_SITE_OTHER): Payer: Medicare HMO

## 2017-06-15 VITALS — BP 120/62 | HR 60 | Temp 98.5°F | Resp 14 | Ht 62.5 in | Wt 127.4 lb

## 2017-06-15 DIAGNOSIS — Z0000 Encounter for general adult medical examination without abnormal findings: Secondary | ICD-10-CM

## 2017-06-15 NOTE — Progress Notes (Addendum)
Subjective:   Frances Kent is a 84 y.o. female who presents for Medicare Annual (Subsequent) preventive examination.  Review of Systems:  No ROS.  Medicare Wellness Visit. Additional risk factors are reflected in the social history.  Cardiac Risk Factors include: advanced age (>51men, >35 women)     Objective:     Vitals: BP 120/62 (BP Location: Left Arm, Patient Position: Sitting, Cuff Size: Normal)   Pulse 60   Temp 98.5 F (36.9 C) (Oral)   Resp 14   Ht 5' 2.5" (1.588 m)   Wt 127 lb 6.4 oz (57.8 kg)   SpO2 97%   BMI 22.93 kg/m   Body mass index is 22.93 kg/m.  Advanced Directives 06/15/2017 06/14/2016 04/08/2016 04/04/2016 04/02/2016 03/04/2016 06/15/2015  Does Patient Have a Medical Advance Directive? Yes Yes Yes Yes No;Yes Yes Yes  Type of Paramedic of [place];Living will [place];Living will Healthcare Power of [place];Living will Living will;Healthcare Power of [place];Living will Living will;Healthcare Power of Attorney  Does patient want to make changes to medical advance directive? No - Patient declined No - Patient declined - Yes (Inpatient - patient defers changing a medical advance directive at this time) - No - Patient declined -  Copy of [place] in Chart? No - copy requested No - copy requested - No - copy requested No - copy requested No - copy requested No - copy requested  Would patient like information on creating a medical advance directive? - - - - - - -    Tobacco Social History   Tobacco Use  Smoking Status Never Smoker  Smokeless Tobacco Never Used  Tobacco Comment   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 , worked at [employer], [employer] given: Not Answered Comment: 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 , worked at [employer], [employer] Intake:  Pre-visit preparation completed: Yes  Pain : No/denies pain     Nutritional Status:  BMI of 19-24  Normal Diabetes: No  How often do you need to have someone help you when you read instructions, pamphlets, or other written materials from your doctor or pharmacy?: 1 - Never  Interpreter Needed?: No     Past Medical History:  Diagnosis Date  . Atrial fibrillation ([place])   . GERD (gastroesophageal reflux disease)   . IPF (idiopathic pulmonary fibrosis) ([place]) 20111   by CT, PFTS done by Humphrey Rolls  . Mitral regurgitation   . Pulmonary embolism ([place]) 02/2016   Past Surgical History:  Procedure Laterality Date  . APPENDECTOMY  1960  . CARDIOVERSION N/A 04/05/2016   Procedure: Cardioversion;  Surgeon: [name], MD;  Location: [facility] ORS;  Service: Cardiovascular;  Laterality: N/A;  . CHOLECYSTECTOMY  1985  . OVARIAN CYST REMOVAL     Family History  Problem Relation Age of Onset  . Cancer Mother 31       breast cancer, lived to 48,   . Breast cancer Mother 65  . Cancer Son 28       pancreatic cancer  . Heart disease Son        CAD, Tobacco Abuse   . Heart disease Maternal Grandmother 30       died of massive MI  . Stroke Sister    Social History   Socioeconomic History  . Marital status: Widowed    Spouse name: Not on file  . Number of children: Not on file  .  Years of education: Not on file  . Highest education level: Not on file  Occupational History  . Not on file  Social Needs  . Financial resource strain: Not hard at all  . Food insecurity:    Worry: Never true    Inability: Never true  . Transportation needs:    Medical: No    Non-medical: No  Tobacco Use  . Smoking status: Never Smoker  . Smokeless tobacco: Never Used  . Tobacco comment: 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 , worked at [employer], [employer] and Sexual Activity  . Alcohol use: No  . Drug use: No  . Sexual activity: Not Currently  Lifestyle  . Physical activity:    Days per week: Not on file    Minutes per session: Not on file  . Stress: Not at all  Relationships  . Social  connections:    Talks on phone: Not on file    Gets together: Not on file    Attends religious service: Not on file    Active member of club or organization: Not on file    Attends meetings of clubs or organizations: Not on file    Relationship status: Not on file  Other Topics Concern  . Not on file  Social History Narrative   Has caretaking responsibility for 3 young grandchildren who live with her    Outpatient Encounter Medications as of 06/15/2017  Medication Sig  . acidophilus (RISAQUAD) CAPS capsule Take 1 capsule by mouth daily. Reported on 04/20/2015  . bevacizumab (AVASTIN) 1.25 mg/0.1 mL SOLN by Intravitreal route every 6 (six) weeks. For macular degeneration  . Cholecalciferol (VITAMIN D3) 1000 units CAPS Take by mouth.  . metoprolol tartrate (LOPRESSOR) 25 MG tablet Take 0.5 tablets (12.5 mg total) by mouth 2 (two) times daily.  . pantoprazole (PROTONIX) 40 MG tablet TAKE 1 TABLET BY MOUTH ONCE DAILY AS NEEDED  . Probiotic Product (PROBIOTIC ADVANCED) CAPS Take 1 capsule by mouth.  . rivaroxaban (XARELTO) 20 MG TABS tablet Take 20 mg by mouth daily.   No facility-administered encounter medications on file as of 06/15/2017.     Activities of Daily Living In your present state of health, do you have any difficulty performing the following activities: 06/15/2017  Hearing? N  Vision? N  Difficulty concentrating or making decisions? N  Walking or climbing stairs? N  Dressing or bathing? N  Doing errands, shopping? N  Preparing Food and eating ? N  Using the Toilet? N  In the past six months, have you accidently leaked urine? N  Do you have problems with loss of bowel control? N  Managing your Medications? N  Managing your Finances? N  Housekeeping or managing your Housekeeping? N  Some recent data might be hidden    Patient Care Team: [name], MD as PCP - General (Internal Medicine) Bary Castilla, Forest Gleason, MD (General Surgery) [name], MD (Internal  Medicine)    Assessment:   This is a routine wellness examination for Frances Kent.  The goal of the wellness visit is to assist the patient how to close the gaps in care and create a preventative care plan for the patient.   The roster of all physicians providing medical care to patient is listed in the Snapshot section of the chart.  Taking calcium VIT D as appropriate/Osteoporosis risk reviewed.    Safety issues reviewed; Smoke and carbon monoxide detectors in the home. No firearms in the home. Wears seatbelts when driving or riding  with others. No violence in the home.  They do not have excessive sun exposure.  Discussed the need for sun protection: hats, long sleeves and the use of sunscreen if there is significant sun exposure.  Patient is alert, normal appearance, oriented to person/place/and time.  Correctly identified the president of the [place] and recalls of 3/3 words. Performs simple calculations and can read correct time from watch face. Displays appropriate judgement.  No new identified risk were noted.  No failures at ADL's or IADL's.    BMI- discussed the importance of a healthy diet, water intake and the benefits of aerobic exercise. Educational material provided.   24 hour diet recall: Regular diet  Dental- dentures  Eye- Visual acuity not assessed per patient preference since they have regular follow up with the ophthalmologist.  Wears corrective lenses.  Sleep patterns- Sleeps through the night without issues.     TDAP vaccine deferred per patient preference.  Follow up with insurance.  Educational material provided.  Patient Concerns: None at this time. Follow up with PCP as needed.  Exercise Activities and Dietary recommendations Current Exercise Habits: Home exercise routine, Type of exercise: walking, Time (Minutes): 20, Frequency (Times/Week): 4, Weekly Exercise (Minutes/Week): 80  Goals    . Healthy Lifestyle     Stay hydrated and drink plenty of  fluids. Low carb foods.  Lean meats and vegetables. Stay active and walk for exercise as tolerated.       Fall Risk Fall Risk  06/15/2017 06/14/2016 11/23/2015 06/15/2015 04/06/2015  Falls in the past year? No No No No No   Depression Screen PHQ 2/9 Scores 06/15/2017 06/14/2016 04/08/2016 11/23/2015  PHQ - 2 Score 0 0 0 0  PHQ- 9 Score - 0 - -     Cognitive Function MMSE - Mini Mental State Exam 06/14/2016 06/15/2015  Orientation to time 5 5  Orientation to Place 5 5  Registration 3 3  Attention/ Calculation 5 5  Recall 3 3  Language- name 2 objects 2 2  Language- repeat 1 1  Language- follow 3 step command 3 3  Language- read & follow direction 1 1  Write a sentence 1 1  Copy design 1 1  Total score 30 30     6CIT Screen 06/15/2017  What Year? 0 points  What month? 0 points  What time? 0 points  Count back from 20 0 points  Months in reverse 0 points  Repeat phrase 0 points  Total Score 0    Immunization History  Administered Date(s) Administered  . Pneumococcal Conjugate-13 12/11/2014  . Pneumococcal Polysaccharide-23 07/13/2016   Screening Tests Health Maintenance  Topic Date Due  . TETANUS/TDAP  08/31/1949  . INFLUENZA VACCINE  09/14/2017  . DEXA SCAN  Completed  . PNA vac Low Risk Adult  Completed      Plan:    End of life planning; Advance aging; Advanced directives discussed. Copy of current HCPOA/Living Will requested.    I have personally reviewed and noted the following in the patient's chart:   . Medical and social history . Use of alcohol, tobacco or illicit drugs  . Current medications and supplements . Functional ability and status . Nutritional status . Physical activity . Advanced directives . List of other physicians . Hospitalizations, surgeries, and ER visits in previous 12 months . Vitals . Screenings to include cognitive, depression, and falls . Referrals and appointments  In addition, I have reviewed and discussed with patient certain  preventive protocols, quality  metrics, and best practice recommendations. A written personalized care plan for preventive services as well as general preventive health recommendations were provided to patient.     OBrien-Blaney, Denisa L, LPN  2/0/1007     I have reviewed the above information and agree with above.   Deborra Medina, MD

## 2017-06-15 NOTE — Patient Instructions (Addendum)
  Frances Kent , Thank you for taking time to come for your Medicare Wellness Visit. I appreciate your ongoing commitment to your health goals. Please review the following plan we discussed and let me know if I can assist you in the future.   These are the goals we discussed: Goals    . Healthy Lifestyle     Stay hydrated and drink plenty of fluids. Low carb foods.  Lean meats and vegetables. Stay active and walk for exercise as tolerated.       This is a list of the screening recommended for you and due dates:  Health Maintenance  Topic Date Due  . Tetanus Vaccine  08/31/1949  . Flu Shot  09/14/2017  . DEXA scan (bone density measurement)  Completed  . Pneumonia vaccines  Completed

## 2017-07-18 DIAGNOSIS — H353221 Exudative age-related macular degeneration, left eye, with active choroidal neovascularization: Secondary | ICD-10-CM | POA: Diagnosis not present

## 2017-07-18 DIAGNOSIS — Z961 Presence of intraocular lens: Secondary | ICD-10-CM | POA: Diagnosis not present

## 2017-07-24 ENCOUNTER — Ambulatory Visit (INDEPENDENT_AMBULATORY_CARE_PROVIDER_SITE_OTHER): Payer: Medicare HMO | Admitting: Internal Medicine

## 2017-07-24 ENCOUNTER — Encounter: Payer: Medicare HMO | Admitting: Internal Medicine

## 2017-07-24 ENCOUNTER — Encounter: Payer: Self-pay | Admitting: Internal Medicine

## 2017-07-24 VITALS — BP 112/60 | HR 64 | Temp 98.0°F | Resp 15 | Ht 62.5 in | Wt 128.4 lb

## 2017-07-24 DIAGNOSIS — R5383 Other fatigue: Secondary | ICD-10-CM | POA: Diagnosis not present

## 2017-07-24 DIAGNOSIS — Z0000 Encounter for general adult medical examination without abnormal findings: Secondary | ICD-10-CM | POA: Diagnosis not present

## 2017-07-24 DIAGNOSIS — Z1231 Encounter for screening mammogram for malignant neoplasm of breast: Secondary | ICD-10-CM | POA: Diagnosis not present

## 2017-07-24 DIAGNOSIS — I4891 Unspecified atrial fibrillation: Secondary | ICD-10-CM

## 2017-07-24 DIAGNOSIS — Z1239 Encounter for other screening for malignant neoplasm of breast: Secondary | ICD-10-CM

## 2017-07-24 DIAGNOSIS — E559 Vitamin D deficiency, unspecified: Secondary | ICD-10-CM | POA: Diagnosis not present

## 2017-07-24 DIAGNOSIS — E785 Hyperlipidemia, unspecified: Secondary | ICD-10-CM

## 2017-07-24 LAB — COMPREHENSIVE METABOLIC PANEL
ALT: 11 U/L (ref 0–35)
AST: 15 U/L (ref 0–37)
Albumin: 4.1 g/dL (ref 3.5–5.2)
Alkaline Phosphatase: 77 U/L (ref 39–117)
BUN: 9 mg/dL (ref 6–23)
CHLORIDE: 104 meq/L (ref 96–112)
CO2: 29 mEq/L (ref 19–32)
Calcium: 9.6 mg/dL (ref 8.4–10.5)
Creatinine, Ser: 0.81 mg/dL (ref 0.40–1.20)
GFR: 71.44 mL/min (ref >60.00)
GLUCOSE: 94 mg/dL (ref 70–99)
Potassium: 4.4 mEq/L (ref 3.5–5.1)
SODIUM: 141 meq/L (ref 135–145)
Total Bilirubin: 0.7 mg/dL (ref 0.2–1.2)
Total Protein: 6.7 g/dL (ref 6.0–8.3)

## 2017-07-24 LAB — LIPID PANEL
CHOL/HDL RATIO: 4
Cholesterol: 264 mg/dL — ABNORMAL HIGH (ref 0–200)
HDL: 73.6 mg/dL (ref >39.00)
LDL Cholesterol: 170 mg/dL — ABNORMAL HIGH (ref 0–99)
NonHDL: 190.44
Triglycerides: 103 mg/dL (ref 0.0–149.0)
VLDL: 20.6 mg/dL (ref 0.0–40.0)

## 2017-07-24 LAB — CBC WITH DIFFERENTIAL/PLATELET
BASOS PCT: 6.8 % — AB (ref 0.0–3.0)
Basophils Absolute: 0.4 10*3/uL — ABNORMAL HIGH (ref 0.0–0.1)
EOS PCT: 1.8 % (ref 0.0–5.0)
Eosinophils Absolute: 0.1 10*3/uL (ref 0.0–0.7)
HCT: 38.9 % (ref 36.0–46.0)
HEMOGLOBIN: 12.9 g/dL (ref 12.0–15.0)
LYMPHS ABS: 1.9 10*3/uL (ref 0.7–4.0)
Lymphocytes Relative: 34.8 % (ref 12.0–46.0)
MCHC: 33.1 g/dL (ref 30.0–36.0)
MCV: 91.4 fl (ref 78.0–100.0)
MONOS PCT: 10.1 % (ref 3.0–12.0)
Monocytes Absolute: 0.5 10*3/uL (ref 0.1–1.0)
Neutro Abs: 2.5 10*3/uL (ref 1.4–7.7)
Neutrophils Relative %: 46.5 % (ref 43.0–77.0)
Platelets: 255 10*3/uL (ref 150.0–400.0)
RBC: 4.25 Mil/uL (ref 3.87–5.11)
RDW: 15.4 % (ref 11.5–15.5)
WBC: 5.3 10*3/uL (ref 4.0–10.5)

## 2017-07-24 LAB — TSH: TSH: 3.54 u[IU]/mL (ref 0.35–4.50)

## 2017-07-24 LAB — VITAMIN D 25 HYDROXY (VIT D DEFICIENCY, FRACTURES): VITD: 42.12 ng/mL (ref 30.00–100.00)

## 2017-07-24 LAB — VITAMIN B12: VITAMIN B 12: 168 pg/mL — AB (ref 211–911)

## 2017-07-24 MED ORDER — ZOSTER VAC RECOMB ADJUVANTED 50 MCG/0.5ML IM SUSR: 0.5000 mL | Freq: Once | INTRAMUSCULAR | 1 refills | Status: AC

## 2017-07-24 MED ORDER — TETANUS-DIPHTH-ACELL PERTUSSIS 5-2.5-18.5 LF-MCG/0.5 IM SUSP: 0.5000 mL | Freq: Once | INTRAMUSCULAR | 0 refills | Status: AC

## 2017-07-24 NOTE — Assessment & Plan Note (Signed)
She appears to be in NSR on  metoprolol, chronically, Continue Xarelto ,  Advised to contact cardiologist if she becomes excessively fatigued or presyncopal.

## 2017-07-24 NOTE — Progress Notes (Signed)
+ Patient ID: Frances Kent, female    DOB: Jan 03, 1931  Age: 84 y.o. MRN: [id number]  The patient is here for annual preventive examination and management of other chronic and acute problems.   The risk factors are reflected in the social history.  The roster of all physicians providing medical care to patient - is listed in the Snapshot section of the chart.  Activities of daily living:  The patient is 100% independent in all ADLs: dressing, toileting, feeding as well as independent mobility  Home safety : The patient has smoke detectors in the home. They wear seatbelts.  There are no firearms at home. There is no violence in the home.   There is no risks for hepatitis, STDs or HIV. There is no   history of blood transfusion. They have no travel history to infectious disease endemic areas of the world.  The patient has seen their dentist in the last six month. They have seen their eye doctor in the last year. They admit to slight hearing difficulty with regard to whispered voices and some television programs.  They have deferred audiologic testing in the last year.  They do not  have excessive sun exposure. Discussed the need for sun protection: hats, long sleeves and use of sunscreen if there is significant sun exposure.   Diet: the importance of a healthy diet is discussed. They do have a healthy diet.  The benefits of regular aerobic exercise were discussed. Sheis not walking regularly  .   Depression screen: there are no signs or vegative symptoms of depression- irritability, change in appetite, anhedonia, sadness/tearfullness.  Cognitive assessment: the patient manages all their financial and personal affairs and is actively engaged. They could relate day,date,year and events; recalled 2/3 objects at 3 minutes; performed clock-face test normally.  The following portions of the patient's history were reviewed and updated as appropriate: allergies, current medications, past family  history, past medical history,  past surgical history, past social history  and problem list.  Visual acuity was not assessed per patient preference since she has regular follow up with her ophthalmologist. Hearing and body mass index were assessed and reviewed.   During the course of the visit the patient was educated and counseled about appropriate screening and preventive services including : fall prevention , diabetes screening, nutrition counseling, colorectal cancer screening, and recommended immunizations.    CC: The primary encounter diagnosis was Vitamin D deficiency. Diagnoses of Hyperlipidemia LDL goal <160, Fatigue, unspecified type, Breast cancer screening, Screening for breast cancer, Atrial fibrillation status post cardioversion [facility]), and Encounter for preventive health examination were also pertinent to this visit.  1) fatigue: worse since starting Xarelto.  Takes metoprolol 12.5 ,  Home readings 124 /70.  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  Doing all the cooking and cleaning and laundry for all of them!     Diet reviewed,  Belvita cookie with coffee in the am. Eats 6-7 times daily  Lunch  And dinner.  Doesn't ant to regain the weight she lost earlier in the year. Several snacks  History Vernis has a past medical history of Atrial fibrillation ([place]), GERD (gastroesophageal reflux disease), IPF (idiopathic pulmonary fibrosis) ([place]) ([address]11), Mitral regurgitation, and Pulmonary embolism ([place]) (02/2016).   She has a past surgical history that includes Appendectomy (1960); Cholecystectomy (1985); Ovarian cyst removal; and CARDIOVERSION (N/A, 04/05/2016).   Her family history includes Breast cancer (age  of onset: 65) in her mother; Cancer (age of onset: 41) in her son; Cancer (age of onset: 71) in her mother; Heart disease in her son; Heart disease (age of onset: 11) in her maternal grandmother; Stroke in her  sister.She reports that she has never smoked. She has never used smokeless tobacco. She reports that she does not drink alcohol or use drugs.  Outpatient Medications Prior to Visit  Medication Sig Dispense Refill  . bevacizumab (AVASTIN) 1.25 mg/0.1 mL SOLN by Intravitreal route every 6 (six) weeks. For macular degeneration    . Cholecalciferol (VITAMIN D3) 1000 units CAPS Take by mouth.    . metoprolol tartrate (LOPRESSOR) 25 MG tablet Take 0.5 tablets (12.5 mg total) by mouth 2 (two) times daily. 30 tablet 5  . pantoprazole (PROTONIX) 40 MG tablet TAKE 1 TABLET BY MOUTH ONCE DAILY AS NEEDED 90 tablet 0  . Probiotic Product (PROBIOTIC ADVANCED) CAPS Take 1 capsule by mouth.    . rivaroxaban (XARELTO) 20 MG TABS tablet Take 20 mg by mouth daily.    Marland Kitchen acidophilus (RISAQUAD) CAPS capsule Take 1 capsule by mouth daily. Reported on 04/20/2015     No facility-administered medications prior to visit.     Review of Systems   Patient denies headache, fevers, malaise, unintentional weight loss, skin rash, eye pain, sinus congestion and sinus pain, sore throat, dysphagia,  hemoptysis , cough, dyspnea, wheezing, chest pain, palpitations, orthopnea, edema, abdominal pain, nausea, melena, diarrhea, constipation, flank pain, dysuria, hematuria, urinary  Frequency, nocturia, numbness, tingling, seizures,  Focal weakness, Loss of consciousness,  Tremor, insomnia, depression, anxiety, and suicidal ideation.      Objective:  BP 112/60 (BP Location: Left Arm, Patient Position: Sitting, Cuff Size: Normal)   Pulse 64   Temp 98 F (36.7 C) (Oral)   Resp 15   Ht 5' 2.5" (1.588 m)   Wt 128 lb 6.4 oz (58.2 kg)   SpO2 97%   BMI 23.11 kg/m   Physical Exam   General appearance: alert, cooperative and appears stated age Head: Normocephalic, without obvious abnormality, atraumatic Eyes: conjunctivae/corneas clear. PERRL, EOM's intact. Fundi benign. Ears: normal TM's and external ear canals both ears Nose:  Nares normal. Septum midline. Mucosa normal. No drainage or sinus tenderness. Throat: lips, mucosa, and tongue normal; teeth and gums normal Neck: no adenopathy, no carotid bruit, no JVD, supple, symmetrical, trachea midline and thyroid not enlarged, symmetric, no tenderness/mass/nodules Lungs: clear to auscultation bilaterally Breasts: normal appearance, no masses or tenderness Heart: regular rate and rhythm, S1, S2 normal, no murmur, click, rub or gallop Abdomen: soft, non-tender; bowel sounds normal; no masses,  no organomegaly Extremities: extremities normal, atraumatic, no cyanosis or edema Pulses: 2+ and symmetric Skin: Skin color, texture, turgor normal. No rashes or lesions Neurologic: Alert and oriented X 3, normal strength and tone. Normal symmetric reflexes. Normal coordination and gait.      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  Ruling out anemia  CKd and thyroid  dysfunction .      Relevant Orders   Comprehensive metabolic panel   CBC with Differential/Platelet   TSH   Vitamin B12   Encounter for preventive health examination    Annual comprehensive preventive exam was done as well as an evaluation and management of chronic conditions .  During the course of the visit the patient was educated and counseled about appropriate screening and preventive services including :  diabetes screening, lipid analysis with projected  10 year  risk for CAD , nutrition counseling, breast,  and recommended immunizations.  Printed recommendations for  health maintenance screenings was given      Atrial fibrillation status post cardioversion [facility])    She appears to be in NSR on  metoprolol, chronically, Continue Xarelto ,  Advised to contact cardiologist if she becomes excessively fatigued or presyncopal.        Other Visit Diagnoses    Breast cancer screening       Relevant Orders   MM 3D SCREEN BREAST BILATERAL      I have discontinued [employer]'s acidophilus. I am also having her start on Tdap and Zoster Vaccine Adjuvanted. Additionally, I am having her maintain her bevacizumab, Vitamin D3, rivaroxaban, PROBIOTIC ADVANCED, metoprolol tartrate, and pantoprazole.  Meds ordered this encounter  Medications  . Tdap (BOOSTRIX) 5-2.5-18.5 LF-MCG/0.5 injection    Sig: Inject 0.5 mLs into the muscle once for 1 dose.    Dispense:  0.5 mL    Refill:  0  . Zoster Vaccine Adjuvanted [facility]) injection    Sig: Inject 0.5 mLs into the muscle once for 1 dose.    Dispense:  1 each    Refill:  1    Medications Discontinued During This Encounter  Medication Reason  . acidophilus (RISAQUAD) CAPS capsule Duplicate    Follow-up: Return in about 1 year (around 07/25/2018).   [name], MD

## 2017-07-24 NOTE — Assessment & Plan Note (Signed)
Annual comprehensive preventive exam was done as well as an evaluation and management of chronic conditions .  During the course of the visit the patient was educated and counseled about appropriate screening and preventive services including :  diabetes screening, lipid analysis with projected  10 year  risk for CAD , nutrition counseling, breast,  and recommended immunizations.  Printed recommendations for health maintenance screenings was given 

## 2017-07-24 NOTE — Assessment & Plan Note (Signed)
Mammogram ordered,  breast exam done  

## 2017-07-24 NOTE — Patient Instructions (Addendum)
You are fatigued because you are taking care of 3 other people!!!  I want you to start walking for 20 minutes daily for exercise!   I strongly recommend that you get the TDaP vaccine at [facility] .  It is good for ten years for protection against tetanus,  And for lifetime protection against  diptheria and whooping cough    The ShingRx vaccine is now available in local pharmacies and is much more protective thant Zostavax,  It is therefore ADVISED for all interested adults over 50 to prevent shingles   Your mammogram is due in July

## 2017-07-24 NOTE — Assessment & Plan Note (Addendum)
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  Ruling out anemia  CKd and thyroid dysfunction .

## 2017-07-26 ENCOUNTER — Other Ambulatory Visit: Payer: Self-pay | Admitting: Internal Medicine

## 2017-07-26 ENCOUNTER — Encounter: Payer: Self-pay | Admitting: Internal Medicine

## 2017-07-26 DIAGNOSIS — E538 Deficiency of other specified B group vitamins: Secondary | ICD-10-CM

## 2017-07-26 DIAGNOSIS — D72824 Basophilia: Secondary | ICD-10-CM

## 2017-08-03 ENCOUNTER — Ambulatory Visit (INDEPENDENT_AMBULATORY_CARE_PROVIDER_SITE_OTHER): Payer: Medicare HMO

## 2017-08-03 DIAGNOSIS — E538 Deficiency of other specified B group vitamins: Secondary | ICD-10-CM | POA: Diagnosis not present

## 2017-08-03 MED ORDER — CYANOCOBALAMIN 1000 MCG/ML IJ SOLN
1000.0000 ug | Freq: Once | INTRAMUSCULAR | Status: AC
  Administered 2017-08-03: 1000 ug via INTRAMUSCULAR

## 2017-08-03 NOTE — Progress Notes (Signed)
Patient comes in first weekly B12 injection .Injected left deltoid.  Patient tolerated injection well.

## 2017-08-06 NOTE — Progress Notes (Signed)
  I have reviewed the above information and agree with above.   Colletta Spillers, MD 

## 2017-08-10 ENCOUNTER — Ambulatory Visit (INDEPENDENT_AMBULATORY_CARE_PROVIDER_SITE_OTHER): Payer: Medicare HMO

## 2017-08-10 DIAGNOSIS — E538 Deficiency of other specified B group vitamins: Secondary | ICD-10-CM

## 2017-08-10 MED ORDER — CYANOCOBALAMIN 1000 MCG/ML IJ SOLN
1000.0000 ug | Freq: Once | INTRAMUSCULAR | Status: AC
  Administered 2017-08-10: 1000 ug via INTRAMUSCULAR

## 2017-08-10 NOTE — Progress Notes (Signed)
b12 injection given in right deltoid. Patient tolerated well.

## 2017-08-13 NOTE — Progress Notes (Signed)
  I have reviewed the above information and agree with above.   Sueanne Maniaci, MD 

## 2017-08-22 ENCOUNTER — Ambulatory Visit (INDEPENDENT_AMBULATORY_CARE_PROVIDER_SITE_OTHER): Payer: Medicare HMO | Admitting: *Deleted

## 2017-08-22 DIAGNOSIS — E538 Deficiency of other specified B group vitamins: Secondary | ICD-10-CM

## 2017-08-22 MED ORDER — CYANOCOBALAMIN 1000 MCG/ML IJ SOLN
1000.0000 ug | Freq: Once | INTRAMUSCULAR | Status: AC
Start: 2017-08-22 — End: 2017-08-22
  Administered 2017-08-22: 1000 ug via INTRAMUSCULAR

## 2017-08-22 NOTE — Progress Notes (Signed)
Patient presented for B 12 injection to left deltoid, patient voiced no concerns nor showed any signs of distress during injection. 

## 2017-08-29 ENCOUNTER — Ambulatory Visit (INDEPENDENT_AMBULATORY_CARE_PROVIDER_SITE_OTHER): Payer: Medicare HMO | Admitting: *Deleted

## 2017-08-29 DIAGNOSIS — I341 Nonrheumatic mitral (valve) prolapse: Secondary | ICD-10-CM | POA: Diagnosis not present

## 2017-08-29 DIAGNOSIS — I340 Nonrheumatic mitral (valve) insufficiency: Secondary | ICD-10-CM | POA: Diagnosis not present

## 2017-08-29 DIAGNOSIS — I071 Rheumatic tricuspid insufficiency: Secondary | ICD-10-CM | POA: Diagnosis not present

## 2017-08-29 DIAGNOSIS — I2699 Other pulmonary embolism without acute cor pulmonale: Secondary | ICD-10-CM | POA: Diagnosis not present

## 2017-08-29 DIAGNOSIS — I10 Essential (primary) hypertension: Secondary | ICD-10-CM | POA: Diagnosis not present

## 2017-08-29 DIAGNOSIS — I272 Pulmonary hypertension, unspecified: Secondary | ICD-10-CM | POA: Diagnosis not present

## 2017-08-29 DIAGNOSIS — E538 Deficiency of other specified B group vitamins: Secondary | ICD-10-CM

## 2017-08-29 DIAGNOSIS — I050 Rheumatic mitral stenosis: Secondary | ICD-10-CM | POA: Diagnosis not present

## 2017-08-29 DIAGNOSIS — D72824 Basophilia: Secondary | ICD-10-CM | POA: Diagnosis not present

## 2017-08-29 DIAGNOSIS — I480 Paroxysmal atrial fibrillation: Secondary | ICD-10-CM | POA: Diagnosis not present

## 2017-08-29 DIAGNOSIS — E782 Mixed hyperlipidemia: Secondary | ICD-10-CM | POA: Diagnosis not present

## 2017-08-29 LAB — CBC WITH DIFFERENTIAL/PLATELET
BASOS ABS: 0 10*3/uL (ref 0.0–0.1)
Basophils Relative: 0.7 % (ref 0.0–3.0)
EOS PCT: 2.3 % (ref 0.0–5.0)
Eosinophils Absolute: 0.1 10*3/uL (ref 0.0–0.7)
HCT: 39.3 % (ref 36.0–46.0)
HEMOGLOBIN: 13.1 g/dL (ref 12.0–15.0)
Lymphocytes Relative: 31.2 % (ref 12.0–46.0)
Lymphs Abs: 1.6 10*3/uL (ref 0.7–4.0)
MCHC: 33.3 g/dL (ref 30.0–36.0)
MCV: 91.1 fl (ref 78.0–100.0)
MONOS PCT: 9.5 % (ref 3.0–12.0)
Monocytes Absolute: 0.5 10*3/uL (ref 0.1–1.0)
NEUTROS PCT: 56.3 % (ref 43.0–77.0)
Neutro Abs: 3 10*3/uL (ref 1.4–7.7)
Platelets: 251 10*3/uL (ref 150.0–400.0)
RBC: 4.31 Mil/uL (ref 3.87–5.11)
RDW: 15.4 % (ref 11.5–15.5)
WBC: 5.3 10*3/uL (ref 4.0–10.5)

## 2017-08-29 LAB — VITAMIN B12: VITAMIN B 12: 445 pg/mL (ref 211–911)

## 2017-08-29 MED ORDER — CYANOCOBALAMIN 1000 MCG/ML IJ SOLN
1000.0000 ug | Freq: Once | INTRAMUSCULAR | Status: AC
  Administered 2017-08-29: 1000 ug via INTRAMUSCULAR

## 2017-08-29 NOTE — Progress Notes (Signed)
Patient presented for B 12 injection to right deltoid, patient voiced no concerns nor showed any signs of distress during injection. 

## 2017-08-31 ENCOUNTER — Encounter: Payer: Self-pay | Admitting: Internal Medicine

## 2017-08-31 ENCOUNTER — Ambulatory Visit (INDEPENDENT_AMBULATORY_CARE_PROVIDER_SITE_OTHER): Payer: Medicare HMO | Admitting: Internal Medicine

## 2017-08-31 VITALS — BP 116/54 | HR 48 | Temp 97.8°F | Resp 14 | Ht 62.5 in | Wt 128.2 lb

## 2017-08-31 DIAGNOSIS — M545 Low back pain, unspecified: Secondary | ICD-10-CM

## 2017-08-31 LAB — POCT URINALYSIS DIPSTICK
Bilirubin, UA: 1
GLUCOSE UA: NEGATIVE
Leukocytes, UA: NEGATIVE
Nitrite, UA: NEGATIVE
PH UA: 6 (ref 5.0–8.0)
Protein, UA: POSITIVE — AB
RBC UA: NEGATIVE
Spec Grav, UA: >=1.03 — AB (ref 1.010–1.025)
UROBILINOGEN UA: 0.2 U/dL

## 2017-08-31 LAB — FOLATE RBC: RBC Folate: 618 ng/mL RBC (ref >280)

## 2017-08-31 LAB — INTRINSIC FACTOR ANTIBODIES: INTRINSIC FACTOR: NEGATIVE

## 2017-08-31 NOTE — Progress Notes (Signed)
Subjective:  Patient ID: Frances Kent, female    DOB: 06/02/1930  Age: 84 y.o. MRN: [id number]  CC: The primary encounter diagnosis was Low back pain, unspecified back pain laterality, unspecified chronicity, with sciatica presence unspecified. A diagnosis of Acute bilateral low back pain without sciatica was also pertinent to this visit.  HPI [name] presents for new onset low back pain.  Her pain has been present for the past 6 days,  And is not associated with a fall or event.  Not present with sitting , just with activity and aggravated by bending over.  The pain is localized to her lower back and invlvoes the SI joints bilaterally.  It does not radieate.  It is not associated with leg weakness or numbness.    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    Taking tylenol as needed  Which has helped but not completely relieved her pain    Outpatient Medications Prior to Visit  Medication Sig Dispense Refill  . bevacizumab (AVASTIN) 1.25 mg/0.1 mL SOLN by Intravitreal route every 6 (six) weeks. For macular degeneration    . Cholecalciferol (VITAMIN D3) 1000 units CAPS Take by mouth.    . metoprolol tartrate (LOPRESSOR) 25 MG tablet Take 0.5 tablets (12.5 mg total) by mouth 2 (two) times daily. 30 tablet 5  . pantoprazole (PROTONIX) 40 MG tablet TAKE 1 TABLET BY MOUTH ONCE DAILY AS NEEDED 90 tablet 0  . Probiotic Product (PROBIOTIC ADVANCED) CAPS Take 1 capsule by mouth.    . rivaroxaban (XARELTO) 20 MG TABS tablet Take 20 mg by mouth daily.     No facility-administered medications prior to visit.     Review of Systems;  Patient denies headache, fevers, malaise, unintentional weight loss, skin rash, eye pain, sinus congestion and sinus pain, sore throat, dysphagia,  hemoptysis , cough, dyspnea, wheezing, chest pain, palpitations, orthopnea, edema, abdominal pain, nausea,  melena, diarrhea, constipation, flank pain, dysuria, hematuria, urinary  Frequency, nocturia, numbness, tingling, seizures,  Focal weakness, Loss of consciousness,  Tremor, insomnia, depression, anxiety, and suicidal ideation.      Objective:  BP (!) 116/54 (BP Location: Left Arm, Patient Position: Sitting, Cuff Size: Normal)   Pulse (!) 48   Temp 97.8 F (36.6 C) (Oral)   Resp 14   Ht 5' 2.5" (1.588 m)   Wt 128 lb 3.2 oz (58.2 kg)   SpO2 97%   BMI 23.07 kg/m   BP Readings from Last 3 Encounters:  08/31/17 (!) 116/54  07/24/17 112/60  06/15/17 120/62    Wt Readings from Last 3 Encounters:  08/31/17 128 lb 3.2 oz (58.2 kg)  07/24/17 128 lb 6.4 oz (58.2 kg)  06/15/17 127 lb 6.4 oz (57.8 kg)    General appearance: alert, cooperative and appears stated age Ears: normal TM's and external ear canals both ears Throat: lips, mucosa, and tongue normal; teeth and gums normal Neck: no adenopathy, no carotid bruit, supple, symmetrical, trachea midline and thyroid not enlarged, symmetric, no tenderness/mass/nodules Back: symmetric, no curvature. ROM normal. No CVA tenderness. Lungs: clear to auscultation bilaterally Heart: regular rate and rhythm, S1, S2 normal, no murmur, click, rub or gallop Abdomen: soft, non-tender; bowel sounds normal; no masses,  no organomegaly Pulses: 2+ and symmetric Skin: Skin color, texture, turgor normal. No rashes or lesions Lymph nodes: Cervical, supraclavicular, and axillary nodes normal Musculoskeletal:  Bilateral SI  joint pain with deep palpation.  No spinal tenderness.  Full ROM of hips.  Negative straight leg lift bilaterally. Strength 5/5 bilaterally both proximally and distally  No results found for: HGBA1C  Lab Results  Component Value Date   CREATININE 0.81 07/24/2017   CREATININE 0.86 07/13/2016   CREATININE 0.89 04/05/2016    Lab Results  Component Value Date   WBC 5.3 08/29/2017   HGB 13.1 08/29/2017   HCT 39.3 08/29/2017   PLT  251.0 08/29/2017   GLUCOSE 94 07/24/2017   CHOL 264 (H) 07/24/2017   TRIG 103.0 07/24/2017   HDL 73.60 07/24/2017   LDLDIRECT 185.3 03/27/2013   LDLCALC 170 (H) 07/24/2017   ALT 11 07/24/2017   AST 15 07/24/2017   NA 141 07/24/2017   K 4.4 07/24/2017   CL 104 07/24/2017   CREATININE 0.81 07/24/2017   BUN 9 07/24/2017   CO2 29 07/24/2017   TSH 3.54 07/24/2017   INR 1.03 03/04/2016    US Breast Ltd Uni Left Inc Axilla  Result Date: 08/31/2016 CLINICAL DATA:  Callback from screening mammogram for possible left breast mass EXAM: 2D DIGITAL DIAGNOSTIC LEFT MAMMOGRAM WITH CAD AND ADJUNCT TOMO ULTRASOUND LEFT BREAST COMPARISON:  Previous exam(s). ACR Breast Density Category b: There are scattered areas of fibroglandular density. FINDINGS: Cc and MLO spot-compression views of the left breast were performed with tomosynthesis. On the additional views, there is a 5 mm oval, circumscribed mass in the slightly upper, inner left breast. Mammographic images were processed with CAD. On physical exam, no discrete mass is felt in the area of concern in the upper, inner left breast. Targeted ultrasound is performed, showing an oval, circumscribed, hypoechoic mass at 11 o'clock, 4 cm from the nipple measuring 5 x 4 x 5 mm, corresponding to the mass seen mammographically. No internal vascularity is seen. The mass is thought to likely represent a minimally complicated cyst. IMPRESSION: Probable minimally complicated cyst within the left breast at 11 o'clock. RECOMMENDATION: Ultrasound-guided aspiration of the probable left breast cyst at 11 o'clock. If unable to fully aspirate, ultrasound guided core biopsy is recommended. I have discussed the findings and recommendations with the patient. Results were also provided in writing at the conclusion of the visit. If applicable, a reminder letter will be sent to the patient regarding the next appointment. BI-RADS CATEGORY  4: Suspicious. Electronically Signed   By: Pamelia Hoit M.D.   On: 08/31/2016 11:17   Mm Diag Breast Tomo Uni Left  Result Date: 08/31/2016 CLINICAL DATA:  Callback from screening mammogram for possible left breast mass EXAM: 2D DIGITAL DIAGNOSTIC LEFT MAMMOGRAM WITH CAD AND ADJUNCT TOMO ULTRASOUND LEFT BREAST COMPARISON:  Previous exam(s). ACR Breast Density Category b: There are scattered areas of fibroglandular density. FINDINGS: Cc and MLO spot-compression views of the left breast were performed with tomosynthesis. On the additional views, there is a 5 mm oval, circumscribed mass in the slightly upper, inner left breast. Mammographic images were processed with CAD. On physical exam, no discrete mass is felt in the area of concern in the upper, inner left breast. Targeted ultrasound is performed, showing an oval, circumscribed, hypoechoic mass at 11 o'clock, 4 cm from the nipple measuring 5 x 4 x 5 mm, corresponding to the mass seen mammographically. No internal vascularity is seen. The mass is thought to likely represent a minimally complicated cyst. IMPRESSION: Probable minimally complicated cyst within the left breast at 11 o'clock. RECOMMENDATION: Ultrasound-guided aspiration of the probable left breast cyst  at 11 o'clock. If unable to fully aspirate, ultrasound guided core biopsy is recommended. I have discussed the findings and recommendations with the patient. Results were also provided in writing at the conclusion of the visit. If applicable, a reminder letter will be sent to the patient regarding the next appointment. BI-RADS CATEGORY  4: Suspicious. Electronically Signed   By: Pamelia Hoit M.D.   On: 08/31/2016 11:17    Assessment & Plan:   Problem List Items Addressed This Visit    Low back pain - Primary    No sciatica on exam.  Has SI joint tenderness bilaterally, not enough to suggest insufficiency fractures,  But will consider this if pain does not improve with activity modification.  NSAIDS and tylenol recommended ,  Activity modification  made,        Relevant Orders   DG Lumbar Spine Complete   DG Sacrum/Coccyx   POCT Urinalysis Dipstick (Completed)   DG Lumbar Spine Complete   DG Sacrum/Coccyx      I am having Blanch Media A. Malan maintain her bevacizumab, Vitamin D3, rivaroxaban, PROBIOTIC ADVANCED, metoprolol tartrate, and pantoprazole.  No orders of the defined types were placed in this encounter.   There are no discontinued medications.  Follow-up: No follow-ups on file.   [name], MD

## 2017-08-31 NOTE — Patient Instructions (Addendum)
Your pain is NOT due to  A kidney infection  I believe you have trained the muscles and ligaments from lifting too heavy for your   Please refrain from lifting yoru granddaughter and anything else more than 5 lbs for the next 2 weeks  You can  Use tylenol  Every day up to 200 mg in divided doses for pain control  You can use a maximum of one Aleve daily if additional pain medication   Ice and heat are helpful if used for 15 minutes every few hours   If you pain does not resolve with this plan,  Return for x rays of lumbar spine and sacrum   Back Pain, Adult Back pain is very common. The pain often gets better over time. The cause of back pain is usually not dangerous. Most people can learn to manage their back pain on their own. Follow these instructions at home: Watch your back pain for any changes. The following actions may help to lessen any pain you are feeling:  Stay active. Start with short walks on flat ground if you can. Try to walk farther each day.  Exercise regularly as told by your doctor. Exercise helps your back heal faster. It also helps avoid future injury by keeping your muscles strong and flexible.  Do not sit, drive, or stand in one place for more than 30 minutes.  Do not stay in bed. Resting more than 1-2 days can slow down your recovery.  Be careful when you bend or lift an object. Use good form when lifting: ? Bend at your knees. ? Keep the object close to your body. ? Do not twist.  Sleep on a firm mattress. Lie on your side, and bend your knees. If you lie on your back, put a pillow under your knees.  Take medicines only as told by your doctor.  Put ice on the injured area. ? Put ice in a plastic bag. ? Place a towel between your skin and the bag. ? Leave the ice on for 20 minutes, 2-3 times a day for the first 2-3 days. After that, you can switch between ice and heat packs.  Avoid feeling anxious or stressed. Find good ways to deal with stress, such  as exercise.  Maintain a healthy weight. Extra weight puts stress on your back.  Contact a doctor if:  You have pain that does not go away with rest or medicine.  You have worsening pain that goes down into your legs or buttocks.  You have pain that does not get better in one week.  You have pain at night.  You lose weight.  You have a fever or chills. Get help right away if:  You cannot control when you poop (bowel movement) or pee (urinate).  Your arms or legs feel weak.  Your arms or legs lose feeling (numbness).  You feel sick to your stomach (nauseous) or throw up (vomit).  You have belly (abdominal) pain.  You feel like you may pass out (faint). This information is not intended to replace advice given to you by your health care provider. Make sure you discuss any questions you have with your health care provider. Document Released: 07/20/2007 Document Revised: 07/09/2015 Document Reviewed: 06/04/2013 Elsevier Interactive Patient Education  [employer].

## 2017-09-02 DIAGNOSIS — M545 Low back pain, unspecified: Secondary | ICD-10-CM | POA: Insufficient documentation

## 2017-09-02 NOTE — Assessment & Plan Note (Signed)
No sciatica on exam.  Has SI joint tenderness bilaterally, not enough to suggest insufficiency fractures,  But will consider this if pain does not improve with activity modification.  NSAIDS and tylenol recommended ,  Activity modification made,

## 2017-09-25 DIAGNOSIS — H353221 Exudative age-related macular degeneration, left eye, with active choroidal neovascularization: Secondary | ICD-10-CM | POA: Diagnosis not present

## 2017-11-08 ENCOUNTER — Other Ambulatory Visit: Payer: Self-pay | Admitting: Internal Medicine

## 2017-11-21 DIAGNOSIS — H353221 Exudative age-related macular degeneration, left eye, with active choroidal neovascularization: Secondary | ICD-10-CM | POA: Diagnosis not present

## 2017-12-14 DIAGNOSIS — I480 Paroxysmal atrial fibrillation: Secondary | ICD-10-CM | POA: Diagnosis not present

## 2017-12-14 DIAGNOSIS — I10 Essential (primary) hypertension: Secondary | ICD-10-CM | POA: Diagnosis not present

## 2017-12-14 DIAGNOSIS — I340 Nonrheumatic mitral (valve) insufficiency: Secondary | ICD-10-CM | POA: Diagnosis not present

## 2017-12-21 ENCOUNTER — Ambulatory Visit (INDEPENDENT_AMBULATORY_CARE_PROVIDER_SITE_OTHER): Payer: Medicare HMO | Admitting: Family Medicine

## 2017-12-21 ENCOUNTER — Encounter: Payer: Self-pay | Admitting: Family Medicine

## 2017-12-21 ENCOUNTER — Ambulatory Visit (INDEPENDENT_AMBULATORY_CARE_PROVIDER_SITE_OTHER): Payer: Medicare HMO

## 2017-12-21 VITALS — BP 154/82 | HR 81 | Temp 99.0°F | Ht 63.0 in | Wt 124.4 lb

## 2017-12-21 DIAGNOSIS — B349 Viral infection, unspecified: Secondary | ICD-10-CM

## 2017-12-21 DIAGNOSIS — R509 Fever, unspecified: Secondary | ICD-10-CM

## 2017-12-21 DIAGNOSIS — R110 Nausea: Secondary | ICD-10-CM

## 2017-12-21 DIAGNOSIS — R531 Weakness: Secondary | ICD-10-CM

## 2017-12-21 DIAGNOSIS — R945 Abnormal results of liver function studies: Secondary | ICD-10-CM

## 2017-12-21 DIAGNOSIS — R059 Cough, unspecified: Secondary | ICD-10-CM

## 2017-12-21 DIAGNOSIS — J84112 Idiopathic pulmonary fibrosis: Secondary | ICD-10-CM | POA: Diagnosis not present

## 2017-12-21 DIAGNOSIS — R05 Cough: Secondary | ICD-10-CM | POA: Diagnosis not present

## 2017-12-21 DIAGNOSIS — R7989 Other specified abnormal findings of blood chemistry: Secondary | ICD-10-CM

## 2017-12-21 LAB — CBC WITH DIFFERENTIAL/PLATELET
BASOS PCT: 0.9 % (ref 0.0–3.0)
Basophils Absolute: 0.1 10*3/uL (ref 0.0–0.1)
EOS PCT: 0.1 % (ref 0.0–5.0)
Eosinophils Absolute: 0 10*3/uL (ref 0.0–0.7)
HCT: 38.6 % (ref 36.0–46.0)
HEMOGLOBIN: 12.8 g/dL (ref 12.0–15.0)
LYMPHS PCT: 14.5 % (ref 12.0–46.0)
Lymphs Abs: 1.3 10*3/uL (ref 0.7–4.0)
MCHC: 33.2 g/dL (ref 30.0–36.0)
MCV: 91.4 fl (ref 78.0–100.0)
MONO ABS: 0.7 10*3/uL (ref 0.1–1.0)
Monocytes Relative: 7.9 % (ref 3.0–12.0)
Neutro Abs: 6.9 10*3/uL (ref 1.4–7.7)
Neutrophils Relative %: 76.6 % (ref 43.0–77.0)
Platelets: 218 10*3/uL (ref 150.0–400.0)
RBC: 4.22 Mil/uL (ref 3.87–5.11)
RDW: 15.1 % (ref 11.5–15.5)
WBC: 9 10*3/uL (ref 4.0–10.5)

## 2017-12-21 LAB — COMPREHENSIVE METABOLIC PANEL
ALBUMIN: 4.3 g/dL (ref 3.5–5.2)
ALT: 80 U/L — AB (ref 0–35)
AST: 46 U/L — ABNORMAL HIGH (ref 0–37)
Alkaline Phosphatase: 95 U/L (ref 39–117)
BILIRUBIN TOTAL: 1.3 mg/dL — AB (ref 0.2–1.2)
BUN: 12 mg/dL (ref 6–23)
CALCIUM: 9.5 mg/dL (ref 8.4–10.5)
CHLORIDE: 104 meq/L (ref 96–112)
CO2: 29 mEq/L (ref 19–32)
Creatinine, Ser: 1.05 mg/dL (ref 0.40–1.20)
GFR: 52.9 mL/min — ABNORMAL LOW (ref >60.00)
Glucose, Bld: 105 mg/dL — ABNORMAL HIGH (ref 70–99)
Potassium: 5.1 mEq/L (ref 3.5–5.1)
SODIUM: 138 meq/L (ref 135–145)
Total Protein: 6.9 g/dL (ref 6.0–8.3)

## 2017-12-21 LAB — POC INFLUENZA A&B (BINAX/QUICKVUE)
INFLUENZA A, POC: NEGATIVE
INFLUENZA B, POC: NEGATIVE

## 2017-12-21 MED ORDER — ONDANSETRON 4 MG PO TBDP: 4.0000 mg | ORAL_TABLET | Freq: Three times a day (TID) | ORAL | 0 refills | Status: DC | PRN

## 2017-12-21 NOTE — Progress Notes (Signed)
Subjective:    Patient ID: Frances Kent, female    DOB: [birth year], 84 y.o.   MRN: [id number]  HPI   Patient presents to clinic with complaints of feeling tired, weak, headache, slight cough, no appetite and feeling chilled for 2 days.  Patient describes it as "I just feel horrible".  Denies any chest pain or shortness of breath.  Denies any palpations.  Denies sinus congestion, runny nose. Denies vomiting or diarrhea.   Patient Active Problem List   Diagnosis Date Noted  . Low back pain 09/02/2017  . B12 deficiency 07/26/2017  . Basophilia 07/26/2017  . Benign breast cyst in female, left 10/07/2016  . Atrial fibrillation status post cardioversion ([place]) 04/06/2016  . Hospital discharge follow-up 03/10/2016  . Right ankle swelling 12/21/2015  . Vitamin D deficiency 04/08/2015  . Cervical spine degeneration 12/13/2014  . History of pulmonary embolism 12/02/2014  . Insomnia 10/04/2014  . Skin lesion of face 12/21/2013  . Achilles tendinosis 12/21/2013  . Generalized anxiety disorder 03/28/2013  . Mitral valve prolapse 03/28/2013  . Encounter for preventive health examination 03/23/2012  . Fatigue 03/23/2012  . IPF (idiopathic pulmonary fibrosis) ([place])   . Screening for breast cancer 11/29/2010  . Macular degeneration, left eye 11/29/2010  . Screening for colon cancer 11/29/2010  . Hyperlipidemia LDL goal <160 11/29/2010  . GERD (gastroesophageal reflux disease)    Social History   Tobacco Use  . Smoking status: Never Smoker  . Smokeless tobacco: Never Used  . Tobacco comment: 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 , worked at [employer], [employer] Use Topics  . Alcohol use: No   Review of Systems   Constitutional: +chills, fatigue and fever.  HENT: Negative for congestion, ear pain, sinus pain and sore throat.   Eyes: Negative.   Respiratory: Negative for cough, shortness of breath and wheezing.   Cardiovascular: Negative for chest pain, palpitations and leg swelling.    Gastrointestinal: +nausea. Negative for abdominal pain, diarrhea, and vomiting.  Genitourinary: Negative for dysuria, frequency and urgency.  Musculoskeletal: "I just feel weak and horrible" Skin: Negative for color change, pallor and rash.  Neurological: Negative for syncope, light-headedness and headaches.  Psychiatric/Behavioral: The patient is not nervous/anxious.       Objective:   Physical Exam  Constitutional: She is oriented to person, place, and time.  Non-toxic appearance. No distress.  HENT:  Head: Normocephalic and atraumatic.  Eyes: Conjunctivae and EOM are normal. No scleral icterus.  Neck: Neck supple. No JVD present. No tracheal deviation present.  Cardiovascular: Normal rate and normal heart sounds.  Pulmonary/Chest: Effort normal and breath sounds normal. No respiratory distress. She has no wheezes. She has no rales.  Abdominal: Soft. Bowel sounds are normal. There is no tenderness.  Musculoskeletal: She exhibits no edema or deformity.  Gait normal.   Neurological: She is alert and oriented to person, place, and time.  Skin: Skin is warm and dry. Capillary refill takes less than 2 seconds. No rash noted. No erythema. No pallor.  Psychiatric: She has a normal mood and affect. Her behavior is normal.  Nursing note and vitals reviewed.     Vitals:   12/21/17 1124  BP: (!) 154/82  Pulse: 81  Temp: 99 F (37.2 C)  SpO2: 95%    FLU A/B negative in clinic   Assessment & Plan:   Viral illness, weakness, nausea, fever chills - flu swab negative in clinic.  We will get CBC and CMP to further investigate symptoms.  Patient  will get chest x-ray due to cough.  Patient advised to use Zofran as needed for nausea, also advised to follow a bland diet with clear liquids for the next few days and be sure to keep up good fluid intake.  Also advised patient that her symptoms worsen, she becomes more weak, fever persists, develops vomiting or diarrhea, develops pain to call  office right away and/or go to ER for evaluation.  Patient verbalized understanding of these return precautions.   Keep regularly scheduled follow-up with PCP as planned.  Return to clinic sooner if any issues arise or if current symptoms persist or worsen.

## 2017-12-21 NOTE — Patient Instructions (Signed)
Flu swab is negative. Suspect viral illness. Rest, increase fluids, take tylenol as needed for pain/fever - max of 3000 mg per day  Bland Diet A bland diet consists of foods that do not have a lot of fat or fiber. Foods without fat or fiber are easier for the body to digest. They are also less likely to irritate your mouth, throat, stomach, and other parts of your gastrointestinal tract. A bland diet is sometimes called a BRAT diet. What is my plan? Your health care provider or dietitian may recommend specific changes to your diet to prevent and treat your symptoms, such as:  Eating small meals often.  Cooking food until it is soft enough to chew easily.  Chewing your food well.  Drinking fluids slowly.  Not eating foods that are very spicy, sour, or fatty.  Not eating citrus fruits, such as oranges and grapefruit.  What do I need to know about this diet?  Eat a variety of foods from the bland diet food list.  Do not follow a bland diet longer than you have to.  Ask your health care provider whether you should take vitamins. What foods can I eat? Grains  Hot cereals, such as cream of wheat. Bread, crackers, or tortillas made from refined white flour. Rice. Vegetables Canned or cooked vegetables. Mashed or boiled potatoes. Fruits Bananas. Applesauce. Other types of cooked or canned fruit with the skin and seeds removed, such as canned peaches or pears. Meats and Other Protein Sources Scrambled eggs. Creamy peanut butter or other nut butters. Lean, well-cooked meats, such as chicken or fish. Tofu. Soups or broths. Dairy Low-fat dairy products, such as milk, cottage cheese, or yogurt. Beverages Water. Herbal tea. Apple juice. Sweets and Desserts Pudding. Custard. Fruit gelatin. Ice cream. Fats and Oils Mild salad dressings. Canola or olive oil. The items listed above may not be a complete list of allowed foods or beverages. Contact your dietitian for more options. What foods  are not recommended? Foods and ingredients that are often not recommended include:  Spicy foods, such as hot sauce or salsa.  Fried foods.  Sour foods, such as pickled or fermented foods.  Raw vegetables or fruits, especially citrus or berries.  Caffeinated drinks.  Alcohol.  Strongly flavored seasonings or condiments.  The items listed above may not be a complete list of foods and beverages that are not allowed. Contact your dietitian for more information. This information is not intended to replace advice given to you by your health care provider. Make sure you discuss any questions you have with your health care provider. Document Released: 05/25/2015 Document Revised: 07/09/2015 Document Reviewed: 02/12/2014 Elsevier Interactive Patient Education  2018 [employer].

## 2017-12-22 NOTE — Addendum Note (Signed)
Addended by: Philis Nettle on: 12/22/2017 08:45 AM   Modules accepted: Orders

## 2017-12-27 ENCOUNTER — Other Ambulatory Visit: Payer: Medicare HMO

## 2017-12-28 ENCOUNTER — Other Ambulatory Visit (INDEPENDENT_AMBULATORY_CARE_PROVIDER_SITE_OTHER): Payer: Medicare HMO

## 2017-12-28 DIAGNOSIS — R945 Abnormal results of liver function studies: Secondary | ICD-10-CM

## 2017-12-28 DIAGNOSIS — R7989 Other specified abnormal findings of blood chemistry: Secondary | ICD-10-CM

## 2017-12-28 LAB — HEPATIC FUNCTION PANEL
ALT: 61 U/L — AB (ref 0–35)
AST: 29 U/L (ref 0–37)
Albumin: 4.1 g/dL (ref 3.5–5.2)
Alkaline Phosphatase: 96 U/L (ref 39–117)
Bilirubin, Direct: 0.2 mg/dL (ref 0.0–0.3)
TOTAL PROTEIN: 6.7 g/dL (ref 6.0–8.3)
Total Bilirubin: 0.8 mg/dL (ref 0.2–1.2)

## 2018-01-01 DIAGNOSIS — I480 Paroxysmal atrial fibrillation: Secondary | ICD-10-CM | POA: Diagnosis not present

## 2018-01-05 ENCOUNTER — Ambulatory Visit (INDEPENDENT_AMBULATORY_CARE_PROVIDER_SITE_OTHER): Payer: Medicare HMO | Admitting: Internal Medicine

## 2018-01-05 ENCOUNTER — Ambulatory Visit (INDEPENDENT_AMBULATORY_CARE_PROVIDER_SITE_OTHER): Payer: Medicare HMO

## 2018-01-05 ENCOUNTER — Encounter: Payer: Self-pay | Admitting: Internal Medicine

## 2018-01-05 VITALS — BP 130/68 | HR 49 | Temp 98.2°F | Resp 12 | Wt 125.5 lb

## 2018-01-05 DIAGNOSIS — R5383 Other fatigue: Secondary | ICD-10-CM | POA: Diagnosis not present

## 2018-01-05 DIAGNOSIS — R740 Nonspecific elevation of levels of transaminase and lactic acid dehydrogenase [LDH]: Secondary | ICD-10-CM | POA: Diagnosis not present

## 2018-01-05 DIAGNOSIS — M47816 Spondylosis without myelopathy or radiculopathy, lumbar region: Secondary | ICD-10-CM | POA: Diagnosis not present

## 2018-01-05 DIAGNOSIS — M5416 Radiculopathy, lumbar region: Secondary | ICD-10-CM

## 2018-01-05 DIAGNOSIS — R7401 Elevation of levels of liver transaminase levels: Secondary | ICD-10-CM

## 2018-01-05 DIAGNOSIS — M48061 Spinal stenosis, lumbar region without neurogenic claudication: Secondary | ICD-10-CM | POA: Diagnosis not present

## 2018-01-05 MED ORDER — TRAMADOL HCL 50 MG PO TABS: 50.0000 mg | ORAL_TABLET | Freq: Three times a day (TID) | ORAL | 0 refills | Status: DC | PRN

## 2018-01-05 NOTE — Patient Instructions (Addendum)
Your fatigue may be coming from your slow heart rate.  Omit the morning dose of metoprolol as a trial to see if your fatigue improves.  Continue the 1/2tablet at night . Your heart rate should improve to about 60 or higher   For your back pain and insomnia:  Try taking tylenol PM at bedtime when you put Adaline down to sleep    You can take  up to 1000 mg of acetominophen (tylenol) every day safely  In divided doses (500 mg every 12   hours  )  You can use the tramadol for severe pain not relieved with tylenol:    50 mg every 8 hours if needed     x rays of lower spine to be done today .  I think you may have spinal stenosis

## 2018-01-05 NOTE — Progress Notes (Signed)
Subjective:  Patient ID: Frances Kent, female    DOB: 01-10-1931  Age: 84 y.o. MRN: [id number]  CC: The primary encounter diagnosis was Lumbar radiculopathy. Diagnoses of Transaminitis, Lumbar radiculitis, and Fatigue, unspecified type were also pertinent to this visit.  HPI Frances Kent presents for evaluation and treatment of low back pain. The pain is present  on the right , accompanied by  A feeling of right leg weakness   Last seen in July and reported similar less significant symptoms which appear to have progressed  Both thighs feel weak,  Recently had trouble ascending a gradual incline due to pain and weakness . Denies signs of  right foot drop :  No stumbling.  No pain  with sitting or lying.  Only with standing or walking.   Has become sedentary due to symptoms.  taking tylenol  Only once daily most days and it helps.  NSAIDs C/I due to use of chronic anticoagulation   2) Fatigue"  Has Irregular sleep hours aggravated by Caring for great great grandchild of 13 months who lives with the child's single mother works nights.  child will not sleep until midnight, then sleeps until noon,  So patient goes to bed late. Says she sleeps better if she goes to bed around 1 or 1:30  ,  Until 7:30, gets her housework done before child wakes up.  Takes metroprolol and amiodarone for control of atrial fib.   No falls.   Lab Results  Component Value Date   CREATININE 1.05 12/21/2017    Outpatient Medications Prior to Visit  Medication Sig Dispense Refill  . amiodarone (PACERONE) 200 MG tablet Take 200 mg by mouth 2 (two) times daily.    . bevacizumab (AVASTIN) 1.25 mg/0.1 mL SOLN by Intravitreal route every 6 (six) weeks. For macular degeneration    . Cholecalciferol (VITAMIN D3) 1000 units CAPS Take by mouth.    . metoprolol tartrate (LOPRESSOR) 25 MG tablet Take 0.5 tablets (12.5 mg total) by mouth 2 (two) times daily. 30 tablet 5  . pantoprazole (PROTONIX) 40 MG tablet TAKE 1 TABLET BY  MOUTH ONCE DAILY AS NEEDED 90 tablet 0  . Probiotic Product (PROBIOTIC ADVANCED) CAPS Take 1 capsule by mouth.    . rivaroxaban (XARELTO) 20 MG TABS tablet Take 20 mg by mouth daily.    . ondansetron (ZOFRAN ODT) 4 MG disintegrating tablet Take 1 tablet (4 mg total) by mouth every 8 (eight) hours as needed for nausea or vomiting. (Patient not taking: Reported on 01/05/2018) 20 tablet 0   No facility-administered medications prior to visit.     Review of Systems;  Patient denies headache, fevers, malaise, unintentional weight loss, skin rash, eye pain, sinus congestion and sinus pain, sore throat, dysphagia,  hemoptysis , cough, dyspnea, wheezing, chest pain, palpitations, orthopnea, edema, abdominal pain, nausea, melena, diarrhea, constipation, flank pain, dysuria, hematuria, urinary  Frequency, nocturia, numbness, tingling, seizures,  Focal weakness, Loss of consciousness,  Tremor,  depression, anxiety, and suicidal ideation.      Objective:  BP 130/68 (BP Location: Left Arm, Patient Position: Sitting, Cuff Size: Normal)   Pulse (!) 49   Temp 98.2 F (36.8 C) (Oral)   Resp 12   Wt 125 lb 8 oz (56.9 kg)   SpO2 97%   BMI 22.23 kg/m   BP Readings from Last 3 Encounters:  01/05/18 130/68  12/21/17 (!) 154/82  08/31/17 (!) 116/54    Wt Readings from Last 3 Encounters:  01/05/18  125 lb 8 oz (56.9 kg)  12/21/17 124 lb 6.4 oz (56.4 kg)  08/31/17 128 lb 3.2 oz (58.2 kg)    General appearance: alert, cooperative and appears stated age Ears: normal TM's and external ear canals both ears Throat: lips, mucosa, and tongue normal; teeth and gums normal Neck: no adenopathy, no carotid bruit, supple, symmetrical, trachea midline and thyroid not enlarged, symmetric, no tenderness/mass/nodules Back: symmetric, no curvature. ROM normal. No CVA tenderness. Lungs: clear to auscultation bilaterally Heart: regular rate and rhythm, S1, S2 normal, no murmur, click, rub or gallop Abdomen: soft,  non-tender; bowel sounds normal; no masses,  no organomegaly Pulses: 2+ and symmetric Skin: Skin color, texture, turgor normal. No rashes or lesions Lymph nodes: Cervical, supraclavicular, and axillary nodes normal. Neuro: CNs 2-12 intact. DTRs 2+/4 in biceps, brachioradialis, patellars and achilles. Muscle strength 5/5 in upper and $+/5 in proximal lower exremities bilaterally . Fine resting tremor bilaterally both hands cerebellar function normal. Romberg negative.  No pronator drift.   Gait normal.   No results found for: HGBA1C  Lab Results  Component Value Date   CREATININE 1.05 12/21/2017   CREATININE 0.81 07/24/2017   CREATININE 0.86 07/13/2016    Lab Results  Component Value Date   WBC 9.0 12/21/2017   HGB 12.8 12/21/2017   HCT 38.6 12/21/2017   PLT 218.0 12/21/2017   GLUCOSE 105 (H) 12/21/2017   CHOL 264 (H) 07/24/2017   TRIG 103.0 07/24/2017   HDL 73.60 07/24/2017   LDLDIRECT 185.3 03/27/2013   LDLCALC 170 (H) 07/24/2017   ALT 61 (H) 12/28/2017   AST 29 12/28/2017   NA 138 12/21/2017   K 5.1 12/21/2017   CL 104 12/21/2017   CREATININE 1.05 12/21/2017   BUN 12 12/21/2017   CO2 29 12/21/2017   TSH 3.54 07/24/2017   INR 1.03 03/04/2016    US Breast Ltd Uni Left Inc Axilla  Result Date: 08/31/2016 CLINICAL DATA:  Callback from screening mammogram for possible left breast mass EXAM: 2D DIGITAL DIAGNOSTIC LEFT MAMMOGRAM WITH CAD AND ADJUNCT TOMO ULTRASOUND LEFT BREAST COMPARISON:  Previous exam(s). ACR Breast Density Category b: There are scattered areas of fibroglandular density. FINDINGS: Cc and MLO spot-compression views of the left breast were performed with tomosynthesis. On the additional views, there is a 5 mm oval, circumscribed mass in the slightly upper, inner left breast. Mammographic images were processed with CAD. On physical exam, no discrete mass is felt in the area of concern in the upper, inner left breast. Targeted ultrasound is performed, showing an  oval, circumscribed, hypoechoic mass at 11 o'clock, 4 cm from the nipple measuring 5 x 4 x 5 mm, corresponding to the mass seen mammographically. No internal vascularity is seen. The mass is thought to likely represent a minimally complicated cyst. IMPRESSION: Probable minimally complicated cyst within the left breast at 11 o'clock. RECOMMENDATION: Ultrasound-guided aspiration of the probable left breast cyst at 11 o'clock. If unable to fully aspirate, ultrasound guided core biopsy is recommended. I have discussed the findings and recommendations with the patient. Results were also provided in writing at the conclusion of the visit. If applicable, a reminder letter will be sent to the patient regarding the next appointment. BI-RADS CATEGORY  4: Suspicious. Electronically Signed   By: Pamelia Hoit M.D.   On: 08/31/2016 11:17   Mm Diag Breast Tomo Uni Left  Result Date: 08/31/2016 CLINICAL DATA:  Callback from screening mammogram for possible left breast mass EXAM: 2D DIGITAL DIAGNOSTIC LEFT MAMMOGRAM  WITH CAD AND ADJUNCT TOMO ULTRASOUND LEFT BREAST COMPARISON:  Previous exam(s). ACR Breast Density Category b: There are scattered areas of fibroglandular density. FINDINGS: Cc and MLO spot-compression views of the left breast were performed with tomosynthesis. On the additional views, there is a 5 mm oval, circumscribed mass in the slightly upper, inner left breast. Mammographic images were processed with CAD. On physical exam, no discrete mass is felt in the area of concern in the upper, inner left breast. Targeted ultrasound is performed, showing an oval, circumscribed, hypoechoic mass at 11 o'clock, 4 cm from the nipple measuring 5 x 4 x 5 mm, corresponding to the mass seen mammographically. No internal vascularity is seen. The mass is thought to likely represent a minimally complicated cyst. IMPRESSION: Probable minimally complicated cyst within the left breast at 11 o'clock. RECOMMENDATION: Ultrasound-guided  aspiration of the probable left breast cyst at 11 o'clock. If unable to fully aspirate, ultrasound guided core biopsy is recommended. I have discussed the findings and recommendations with the patient. Results were also provided in writing at the conclusion of the visit. If applicable, a reminder letter will be sent to the patient regarding the next appointment. BI-RADS CATEGORY  4: Suspicious. Electronically Signed   By: Pamelia Hoit M.D.   On: 08/31/2016 11:17    Assessment & Plan:   Problem List Items Addressed This Visit    Fatigue    Chronic, will reduce metoprolol dose given  bradycardia on exam       Lumbar radiculitis    symptoms suggest spinal stenosis. Plain films ordered and are still not available.  Tramadol and tylenol for pain control.       Transaminitis    Improving on recheck.  No ultrasound planned yet       Relevant Orders   Hepatic function panel    Other Visit Diagnoses    Lumbar radiculopathy    -  Primary   Relevant Orders   DG Lumbar Spine Complete      A total of 25 minutes of face to face time was spent with patient more than half of which was spent in counselling about the above mentioned conditions  and coordination of care .   I have discontinued Blanch Media A. Ezelle's ondansetron. I am also having her start on traMADol. Additionally, I am having her maintain her bevacizumab, Vitamin D3, rivaroxaban, PROBIOTIC ADVANCED, metoprolol tartrate, pantoprazole, and amiodarone.  Meds ordered this encounter  Medications  . traMADol (ULTRAM) 50 MG tablet    Sig: Take 1 tablet (50 mg total) by mouth every 8 (eight) hours as needed.    Dispense:  30 tablet    Refill:  0    Medications Discontinued During This Encounter  Medication Reason  . ondansetron (ZOFRAN ODT) 4 MG disintegrating tablet No longer needed (for PRN medications)    Follow-up: No follow-ups on file.   [name], MD

## 2018-01-07 DIAGNOSIS — M5416 Radiculopathy, lumbar region: Secondary | ICD-10-CM | POA: Insufficient documentation

## 2018-01-07 DIAGNOSIS — R740 Nonspecific elevation of levels of transaminase and lactic acid dehydrogenase [LDH]: Secondary | ICD-10-CM

## 2018-01-07 DIAGNOSIS — R7401 Elevation of levels of liver transaminase levels: Secondary | ICD-10-CM | POA: Insufficient documentation

## 2018-01-07 NOTE — Assessment & Plan Note (Signed)
symptoms suggest spinal stenosis. Plain films ordered and are still not available.  Tramadol and tylenol for pain control.  

## 2018-01-07 NOTE — Assessment & Plan Note (Signed)
Chronic, will reduce metoprolol dose given  bradycardia on exam

## 2018-01-07 NOTE — Assessment & Plan Note (Signed)
Improving on recheck.  No ultrasound planned yet

## 2018-01-09 ENCOUNTER — Other Ambulatory Visit (INDEPENDENT_AMBULATORY_CARE_PROVIDER_SITE_OTHER): Payer: Medicare HMO

## 2018-01-09 DIAGNOSIS — I341 Nonrheumatic mitral (valve) prolapse: Secondary | ICD-10-CM | POA: Diagnosis not present

## 2018-01-09 DIAGNOSIS — R740 Nonspecific elevation of levels of transaminase and lactic acid dehydrogenase [LDH]: Secondary | ICD-10-CM

## 2018-01-09 DIAGNOSIS — I050 Rheumatic mitral stenosis: Secondary | ICD-10-CM | POA: Diagnosis not present

## 2018-01-09 DIAGNOSIS — R7401 Elevation of levels of liver transaminase levels: Secondary | ICD-10-CM

## 2018-01-09 DIAGNOSIS — I480 Paroxysmal atrial fibrillation: Secondary | ICD-10-CM | POA: Diagnosis not present

## 2018-01-09 LAB — HEPATIC FUNCTION PANEL
ALT: 37 U/L — AB (ref 0–35)
AST: 30 U/L (ref 0–37)
Albumin: 4 g/dL (ref 3.5–5.2)
Alkaline Phosphatase: 87 U/L (ref 39–117)
BILIRUBIN TOTAL: 0.8 mg/dL (ref 0.2–1.2)
Bilirubin, Direct: 0.1 mg/dL (ref 0.0–0.3)
TOTAL PROTEIN: 6.5 g/dL (ref 6.0–8.3)

## 2018-01-10 DIAGNOSIS — I050 Rheumatic mitral stenosis: Secondary | ICD-10-CM | POA: Insufficient documentation

## 2018-01-15 ENCOUNTER — Telehealth: Payer: Self-pay

## 2018-01-15 NOTE — Telephone Encounter (Signed)
Copied from [place] [id number]. Topic: Quick Communication - Lab Results (Clinic Use ONLY) >> Jan 10, 2018  4:32 PM Adair Laundry, CMA wrote: Called patient to inform them of 01/10/2018 lab results. When patient returns call, triage nurse may disclose results.   [facility] may speak with pt. >> Jan 10, 2018  4:58 PM Percell Belt A wrote: Pt called back, all nurse on the other line

## 2018-01-16 DIAGNOSIS — H353212 Exudative age-related macular degeneration, right eye, with inactive choroidal neovascularization: Secondary | ICD-10-CM | POA: Diagnosis not present

## 2018-01-16 DIAGNOSIS — H353221 Exudative age-related macular degeneration, left eye, with active choroidal neovascularization: Secondary | ICD-10-CM | POA: Diagnosis not present

## 2018-01-16 NOTE — Telephone Encounter (Signed)
See result note message 

## 2018-02-01 ENCOUNTER — Encounter: Payer: Self-pay | Admitting: *Deleted

## 2018-02-01 ENCOUNTER — Ambulatory Visit
Admission: RE | Admit: 2018-02-01 | Discharge: 2018-02-01 | Disposition: A | Discharge status: Home / Self Care | Payer: Medicare HMO | Source: Home / Self Care | Attending: Internal Medicine | Admitting: Internal Medicine

## 2018-02-01 ENCOUNTER — Encounter
Admission: RE | Disposition: A | Discharge status: Home / Self Care | Payer: Self-pay | Source: Home / Self Care | Attending: Internal Medicine

## 2018-02-01 ENCOUNTER — Other Ambulatory Visit: Payer: Self-pay

## 2018-02-01 ENCOUNTER — Ambulatory Visit
Admission: RE | Admit: 2018-02-01 | Discharge: 2018-02-01 | Disposition: A | Discharge status: Home / Self Care | Payer: Medicare HMO | Attending: Internal Medicine | Admitting: Internal Medicine

## 2018-02-01 DIAGNOSIS — K219 Gastro-esophageal reflux disease without esophagitis: Secondary | ICD-10-CM | POA: Insufficient documentation

## 2018-02-01 DIAGNOSIS — Z8249 Family history of ischemic heart disease and other diseases of the circulatory system: Secondary | ICD-10-CM | POA: Insufficient documentation

## 2018-02-01 DIAGNOSIS — I471 Supraventricular tachycardia: Secondary | ICD-10-CM | POA: Diagnosis not present

## 2018-02-01 DIAGNOSIS — Z79899 Other long term (current) drug therapy: Secondary | ICD-10-CM | POA: Diagnosis not present

## 2018-02-01 DIAGNOSIS — Z7901 Long term (current) use of anticoagulants: Secondary | ICD-10-CM | POA: Insufficient documentation

## 2018-02-01 DIAGNOSIS — I480 Paroxysmal atrial fibrillation: Secondary | ICD-10-CM | POA: Diagnosis not present

## 2018-02-01 DIAGNOSIS — Z86711 Personal history of pulmonary embolism: Secondary | ICD-10-CM | POA: Insufficient documentation

## 2018-02-01 DIAGNOSIS — I052 Rheumatic mitral stenosis with insufficiency: Secondary | ICD-10-CM | POA: Insufficient documentation

## 2018-02-01 DIAGNOSIS — I050 Rheumatic mitral stenosis: Secondary | ICD-10-CM | POA: Diagnosis not present

## 2018-02-01 HISTORY — PX: TEE WITHOUT CARDIOVERSION: SHX5443

## 2018-02-01 SURGERY — ECHOCARDIOGRAM, TRANSESOPHAGEAL
Anesthesia: Moderate Sedation

## 2018-02-01 MED ORDER — SODIUM CHLORIDE 0.9 % IV SOLN: INTRAVENOUS | Status: DC

## 2018-02-01 MED ORDER — MIDAZOLAM HCL 5 MG/5ML IJ SOLN
INTRAMUSCULAR | Status: AC
  Filled 2018-02-01: qty 5

## 2018-02-01 MED ORDER — BUTAMBEN-TETRACAINE-BENZOCAINE 2-2-14 % EX AERO
INHALATION_SPRAY | CUTANEOUS | Status: AC
  Filled 2018-02-01: qty 5

## 2018-02-01 MED ORDER — FENTANYL CITRATE (PF) 100 MCG/2ML IJ SOLN
INTRAMUSCULAR | Status: AC | PRN
  Administered 2018-02-01: 50 ug via INTRAVENOUS

## 2018-02-01 MED ORDER — LIDOCAINE VISCOUS HCL 2 % MT SOLN
OROMUCOSAL | Status: AC
  Filled 2018-02-01: qty 15

## 2018-02-01 MED ORDER — FENTANYL CITRATE (PF) 100 MCG/2ML IJ SOLN
INTRAMUSCULAR | Status: AC
  Filled 2018-02-01: qty 2

## 2018-02-01 MED ORDER — SODIUM CHLORIDE FLUSH 0.9 % IV SOLN
INTRAVENOUS | Status: AC
  Filled 2018-02-01: qty 10

## 2018-02-01 MED ORDER — MIDAZOLAM HCL 2 MG/2ML IJ SOLN
INTRAMUSCULAR | Status: AC | PRN
  Administered 2018-02-01: 2 mg via INTRAVENOUS

## 2018-02-01 NOTE — CV Procedure (Signed)
Transesophageal echocardiogram preliminary report  Frances Kent [id number] 1930/07/10  Preliminary diagnosis Symptomatic valvular heart disease  Postprocedural diagnosis Significant symptomatic valvular heart disease  Time out A timeout was performed by the nursing staff and physicians specifically identifying the procedure performed, identification of the patient, the type of sedation, all allergies and medications, all pertinent medical history, and presedation assessment of nasopharynx. The patient and or family understand the risks of the procedure including the rare risks of death, stroke, heart attack, esophogeal perforation, sore throat, and reaction to medications given.  Moderate sedation During this procedure the patient has received Versed 2 milligrams and fentanyl 50 micrograms to achieve appropriate moderate sedation.  The patient had continued monitoring of heart rate, oxygenation, blood pressure, respiratory rate, and extent of signs of sedation throughout the entire procedure.  The patient received this moderate sedation over a period of 22 minutes.  Both the nursing staff and I were present during the procedure when the patient had moderate sedation for 100% of the time.  Treatment considerations  Further consideration of surgical intervention of significant valvular heart disease  For further details of transesophageal echocardiogram please refer to final report.  Signed,  [name] M.D. [facility] 02/01/2018 8:38 AM

## 2018-02-01 NOTE — Progress Notes (Signed)
*  PRELIMINARY RESULTS* Echocardiogram Echocardiogram Transesophageal has been performed.  Sherrie Sport 02/01/2018, 9:11 AM

## 2018-02-01 NOTE — Progress Notes (Signed)
Echo [address]. At bedside completing bedside echo per request Dr. [name] post TEE. Pt resting, responds to voice denies pain/discomfort.

## 2018-02-25 ENCOUNTER — Emergency Department
Admission: EM | Admit: 2018-02-25 | Discharge: 2018-02-25 | Disposition: A | Discharge status: Home / Self Care | Payer: Medicare HMO | Attending: Emergency Medicine | Admitting: Emergency Medicine

## 2018-02-25 ENCOUNTER — Emergency Department: Payer: Medicare HMO

## 2018-02-25 ENCOUNTER — Other Ambulatory Visit: Payer: Self-pay

## 2018-02-25 ENCOUNTER — Encounter: Payer: Self-pay | Admitting: Emergency Medicine

## 2018-02-25 DIAGNOSIS — I4891 Unspecified atrial fibrillation: Secondary | ICD-10-CM | POA: Diagnosis not present

## 2018-02-25 DIAGNOSIS — R05 Cough: Secondary | ICD-10-CM | POA: Diagnosis not present

## 2018-02-25 DIAGNOSIS — R079 Chest pain, unspecified: Secondary | ICD-10-CM | POA: Diagnosis not present

## 2018-02-25 DIAGNOSIS — R001 Bradycardia, unspecified: Secondary | ICD-10-CM | POA: Diagnosis not present

## 2018-02-25 DIAGNOSIS — R0981 Nasal congestion: Secondary | ICD-10-CM | POA: Diagnosis not present

## 2018-02-25 DIAGNOSIS — J9811 Atelectasis: Secondary | ICD-10-CM

## 2018-02-25 DIAGNOSIS — R059 Cough, unspecified: Secondary | ICD-10-CM

## 2018-02-25 DIAGNOSIS — J90 Pleural effusion, not elsewhere classified: Secondary | ICD-10-CM | POA: Diagnosis not present

## 2018-02-25 LAB — BASIC METABOLIC PANEL
Anion gap: 6 (ref 5–15)
BUN: 13 mg/dL (ref 8–23)
CO2: 26 mmol/L (ref 22–32)
Calcium: 8.8 mg/dL — ABNORMAL LOW (ref 8.9–10.3)
Chloride: 108 mmol/L (ref 98–111)
Creatinine, Ser: 1.03 mg/dL — ABNORMAL HIGH (ref 0.44–1.00)
GFR calc Af Amer: 57 mL/min — ABNORMAL LOW (ref >60)
GFR calc non Af Amer: 50 mL/min — ABNORMAL LOW (ref >60)
Glucose, Bld: 105 mg/dL — ABNORMAL HIGH (ref 70–99)
Potassium: 3.7 mmol/L (ref 3.5–5.1)
Sodium: 140 mmol/L (ref 135–145)

## 2018-02-25 LAB — INFLUENZA PANEL BY PCR (TYPE A & B)
Influenza A By PCR: NEGATIVE
Influenza B By PCR: NEGATIVE

## 2018-02-25 LAB — CBC
HCT: 37.9 % (ref 36.0–46.0)
HEMOGLOBIN: 12 g/dL (ref 12.0–15.0)
MCH: 29.6 pg (ref 26.0–34.0)
MCHC: 31.7 g/dL (ref 30.0–36.0)
MCV: 93.6 fL (ref 80.0–100.0)
Platelets: 258 10*3/uL (ref 150–400)
RBC: 4.05 MIL/uL (ref 3.87–5.11)
RDW: 15.3 % (ref 11.5–15.5)
WBC: 5.7 10*3/uL (ref 4.0–10.5)
nRBC: 0 % (ref 0.0–0.2)

## 2018-02-25 LAB — TROPONIN I: Troponin I: <0.03 ng/mL (ref <0.03)

## 2018-02-25 MED ORDER — ACETAMINOPHEN 500 MG PO TABS
1000.0000 mg | ORAL_TABLET | Freq: Once | ORAL | Status: AC
  Administered 2018-02-25: 1000 mg via ORAL
  Filled 2018-02-25: qty 2

## 2018-02-25 MED ORDER — SODIUM CHLORIDE 0.9 % IV BOLUS
1000.0000 mL | Freq: Once | INTRAVENOUS | Status: AC
  Administered 2018-02-25: 1000 mL via INTRAVENOUS

## 2018-02-25 MED ORDER — IOHEXOL 350 MG/ML SOLN
75.0000 mL | Freq: Once | INTRAVENOUS | Status: AC | PRN
  Administered 2018-02-25: 75 mL via INTRAVENOUS

## 2018-02-25 NOTE — ED Triage Notes (Signed)
Left chest wall pain ( under left breast) , worse with deep breaths x1 day , a cough x5 days.

## 2018-02-25 NOTE — Discharge Instructions (Addendum)
Please make an appointment with your primary care physician for reevaluation.  Today, there is no evidence of pneumonia or blood clot.  Return to the emergency department if you develop severe pain, lightheadedness or fainting, shortness of breath, fever, or any other symptoms concerning to you.

## 2018-02-25 NOTE — ED Provider Notes (Addendum)
[facility] Emergency Department Provider Note  ____________________________________________  Time seen: Approximately 12:03 PM  I have reviewed the triage vital signs and the nursing notes.   HISTORY  Chief Complaint Chest Pain    HPI Frances Kent is a 85 y.o. female with a history of PE on Xarelto, A. fib, idiopathic pulmonary fibrosis, presenting with left-sided chest pain and cough.  The patient reports that for the past week, she has had nonproductive cough with some mild congestion and rhinorrhea; no sore throat or ear pain, fevers or chills.  Over the past 2 days, she has developed a sharp pain below the left breast which is worse with deep breaths associated with some mild shortness of breath.  She has not noted any lower extremity swelling or calf pain.  No lightheadedness or syncope.  She tried Tylenol for her pain yesterday, which helped only a little.  She has not missed any doses of her Xarelto.  She has not had her influenza vaccination this year but denies any GI symptoms.  Past Medical History:  Diagnosis Date  . Atrial fibrillation ([place])   . GERD (gastroesophageal reflux disease)   . IPF (idiopathic pulmonary fibrosis) ([place]) 20111   by CT, PFTS done by Humphrey Rolls  . Mitral regurgitation   . Pulmonary embolism ([place]) 02/2016    Patient Active Problem List   Diagnosis Date Noted  . Lumbar radiculitis 01/07/2018  . Transaminitis 01/07/2018  . Low back pain 09/02/2017  . B12 deficiency 07/26/2017  . Basophilia 07/26/2017  . Benign breast cyst in female, left 10/07/2016  . Atrial fibrillation status post cardioversion ([place]) 04/06/2016  . Hospital discharge follow-up 03/10/2016  . Right ankle swelling 12/21/2015  . Vitamin D deficiency 04/08/2015  . Cervical spine degeneration 12/13/2014  . History of pulmonary embolism 12/02/2014  . Insomnia 10/04/2014  . Skin lesion of face 12/21/2013  . Achilles tendinosis 12/21/2013  . Generalized  anxiety disorder 03/28/2013  . Mitral valve prolapse 03/28/2013  . Encounter for preventive health examination 03/23/2012  . Fatigue 03/23/2012  . IPF (idiopathic pulmonary fibrosis) ([place])   . Screening for breast cancer 11/29/2010  . Macular degeneration, left eye 11/29/2010  . Screening for colon cancer 11/29/2010  . Hyperlipidemia LDL goal <160 11/29/2010  . GERD (gastroesophageal reflux disease)     Past Surgical History:  Procedure Laterality Date  . APPENDECTOMY  1960  . CARDIOVERSION N/A 04/05/2016   Procedure: Cardioversion;  Surgeon: [name], MD;  Location: [facility] ORS;  Service: Cardiovascular;  Laterality: N/A;  . CHOLECYSTECTOMY  1985  . OVARIAN CYST REMOVAL    . TEE WITHOUT CARDIOVERSION N/A 02/01/2018   Procedure: TRANSESOPHAGEAL ECHOCARDIOGRAM (TEE);  Surgeon: [name], MD;  Location: [facility] ORS;  Service: Cardiovascular;  Laterality: N/A;    Current Outpatient Rx  . Order #: [id number] Class: Historical Med  . Order #: [id number] Class: Historical Med  . Order #: [id number] Class: Historical Med  . Order #: [id number] Class: No Print  . Order #: [id number] Class: Normal  . Order #: [id number] Class: Historical Med  . Order #: [id number] Class: Historical Med  . Order #: [id number] Class: Print    Allergies Patient has no known allergies.  Family History  Problem Relation Age of Onset  . Cancer Mother 28       breast cancer, lived to 15,   . Breast cancer Mother 43  . Cancer Son 81       pancreatic cancer  . Heart disease Son  CAD, Tobacco Abuse   . Heart disease Maternal Grandmother 63       died of massive MI  . Stroke Sister     Social History Social History   Tobacco Use  . Smoking status: Never Smoker  . Smokeless tobacco: Never Used  . Tobacco comment: 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 , worked at [employer], [employer] Use Topics  . Alcohol use: No  . Drug use: No    Review of Systems Constitutional: No fever/chills.  No  lightheadedness or syncope.  No diaphoresis. Eyes: No visual changes. ENT: No sore throat. No congestion or rhinorrhea. Cardiovascular: As of left-sided pleuritic chest pain. Denies palpitations. Respiratory: Positive shortness of breath.  No cough. Gastrointestinal: No abdominal pain.  No nausea, no vomiting.  No diarrhea.  No constipation. Genitourinary: Negative for dysuria. Musculoskeletal: Negative for back pain.  No lower extremity swelling or calf pain. Skin: Negative for rash. Neurological: Negative for headaches. No focal numbness, tingling or weakness.     ____________________________________________   PHYSICAL EXAM:  VITAL SIGNS: ED Triage Vitals  Enc Vitals Group     BP 02/25/18 1015 (!) 143/47     Pulse Rate 02/25/18 1015 (!) 49     Resp 02/25/18 1015 18     Temp 02/25/18 1015 97.8 F (36.6 C)     Temp Source 02/25/18 1015 Oral     SpO2 02/25/18 1015 98 %     Weight 02/25/18 1017 120 lb (54.4 kg)     Height 02/25/18 1017 5\' 3"  (1.6 m)     Head Circumference --      Peak Flow --      Pain Score 02/25/18 1016 6     Pain Loc --      Pain Edu? --      Excl. in [place]? --     Constitutional: Alert and oriented. Answers questions appropriately. Eyes: Conjunctivae are normal.  EOMI. No scleral icterus. Head: Atraumatic. Nose: No congestion/rhinnorhea. Mouth/Throat: Mucous membranes are moist.  Neck: No stridor.  Supple.  No JVD.  No meningismus. Cardiovascular: Normal rate, regular rhythm. No murmurs, rubs or gallops.  Respiratory: Normal respiratory effort.  No accessory muscle use or retractions. Lungs CTAB.  No wheezes, rales or ronchi.  The patient is visibly more uncomfortable with deep breaths.  Her O2 sats are 98% on room air on my exam. Gastrointestinal: Soft, nontender and nondistended.  No guarding or rebound.  No peritoneal signs. Musculoskeletal: No LE edema. No ttp in the calves or palpable cords.  Negative Homan's sign. Neurologic:  A&Ox3.  Speech is  clear.  Face and smile are symmetric.  EOMI.  Moves all extremities well. Skin:  Skin is warm, dry and intact. No rash noted. Psychiatric: Mood and affect are normal. Speech and behavior are normal.  Normal judgement.  ____________________________________________   LABS (all labs ordered are listed, but only abnormal results are displayed)  Labs Reviewed  BASIC METABOLIC PANEL - Abnormal; Notable for the following components:      Result Value   Glucose, Bld 105 (*)    Creatinine, Ser 1.03 (*)    Calcium 8.8 (*)    GFR calc non Af Amer 50 (*)    GFR calc Af Amer 57 (*)    All other components within normal limits  CBC  TROPONIN I  INFLUENZA PANEL BY PCR (TYPE A & B)  TROPONIN I   ____________________________________________  EKG  ED ECG REPORT I, Eula Listen, the attending physician,  personally viewed and interpreted this ECG.   Date: 02/25/2018  EKG Time: 1028  Rate: 46  Rhythm: sinus bradycardia; LAFB; RBBB  Axis: leftward  Intervals:none  ST&T Change: No STEMI  ____________________________________________  RADIOLOGY  Dg Chest 2 View  Result Date: 02/25/2018 CLINICAL DATA:  Patient with chest pain for 1 day. EXAM: CHEST - 2 VIEW COMPARISON:  Chest radiograph 12/21/2017 FINDINGS: Stable cardiomegaly. Left basilar atelectasis. No pleural effusion or pneumothorax. Thoracic spine degenerative changes. IMPRESSION: Cardiomegaly.  Left basilar atelectasis. Electronically Signed   By: Lovey Newcomer M.D.   On: 02/25/2018 11:28   Ct Angio Chest Pe W And/or Wo Contrast  Result Date: 02/25/2018 CLINICAL DATA:  Left-sided chest pain. History of pulmonary embolism. EXAM: CT ANGIOGRAPHY CHEST WITH CONTRAST TECHNIQUE: Multidetector CT imaging of the chest was performed using the standard protocol during bolus administration of intravenous contrast. Multiplanar CT image reconstructions and MIPs were obtained to evaluate the vascular anatomy. CONTRAST:  69mL OMNIPAQUE IOHEXOL  350 MG/ML SOLN COMPARISON:  Chest x-ray from same day. CTA chest dated March 04, 2016. FINDINGS: Cardiovascular: Satisfactory opacification of the pulmonary arteries to the segmental level. No evidence of pulmonary embolism. There is a small web in a right lower lobe posterior basal subsegmental pulmonary artery at the site of previously seen nonocclusive embolism. Stable cardiomegaly. No pericardial effusion. Normal caliber thoracic aorta. Coronary, aortic arch, and branch vessel atherosclerotic vascular disease. Mediastinum/Nodes: Borderline to minimally enlarged mediastinal lymph nodes measuring up to 13 mm in short axis are not significantly changed. No axillary or hilar lymphadenopathy. The thyroid gland, trachea, and esophagus demonstrate no significant findings. Lungs/Pleura: Trace left pleural effusion with left lower lobe and lingular subsegmental atelectasis. No consolidation or pneumothorax. No suspicious pulmonary nodule. Upper Abdomen: No acute abnormality. Musculoskeletal: No chest wall abnormality. No acute or significant osseous findings. Review of the MIP images confirms the above findings. IMPRESSION: 1.  No evidence of pulmonary embolism. 2. Small web in a right lower lobe posterior basal subsegmental pulmonary artery at the site of previously seen nonocclusive embolism. 3. Trace left pleural effusion with left basilar atelectasis. 4.  Aortic atherosclerosis (ICD10-I70.0). Electronically Signed   By: Titus Dubin M.D.   On: 02/25/2018 13:48    ____________________________________________   PROCEDURES  Procedure(s) performed: None  Procedures  Critical Care performed: No ____________________________________________   INITIAL IMPRESSION / ASSESSMENT AND PLAN / ED COURSE  Pertinent labs & imaging results that were available during my care of the patient were reviewed by me and considered in my medical decision making (see chart for details).  85 y.o. female with several days  of cough and mild congestion, now with a pleuritic left-sided chest pain; takes Xarelto for PE.  Overall, the patient is in sinus bradycardia, which she states she has at baseline; she is on metoprolol and this is not new for her.  She has a normal auscultatory exam but her chest x-ray does show some atelectasis in the left lung base.  Given the pleuritic component of her pain, will get a CT to evaluate for PE but would also consider pulmonary infection or pneumonia.  Plan treatment and reevaluation for final disposition.  ----------------------------------------- 2:06 PM on 02/25/2018 -----------------------------------------  The patient CT scan corroborates her chest x-ray findings, with left basilar atelectasis and a small pleural effusion.  There is no evidence for infiltrate or pneumonia and clinically she is not febrile and her white blood cell count is normal.  Her cough is likely due to a  viral URI and antibiotics are not indicated at this time.  There is no evidence of PE today.  Her influenza testing is negative.  ACS or MI is possible given her advanced age, but less likely and she has a reassuring EKG without ischemia and troponin negative x2.  At this time, the patient will be discharged home with close PMD follow-up.  Return precautions as well as follow-up instructions were discussed.  ____________________________________________  FINAL CLINICAL IMPRESSION(S) / ED DIAGNOSES  Final diagnoses:  Pleural effusion on left  Atelectasis of left lung  Left-sided chest pain  Cough  Mild nasal congestion         NEW MEDICATIONS STARTED DURING THIS VISIT:  New Prescriptions   No medications on file      Eula Listen, MD 02/25/18 1213    Eula Listen, MD 02/25/18 1407

## 2018-02-27 DIAGNOSIS — J90 Pleural effusion, not elsewhere classified: Secondary | ICD-10-CM | POA: Insufficient documentation

## 2018-02-27 DIAGNOSIS — I10 Essential (primary) hypertension: Secondary | ICD-10-CM | POA: Diagnosis not present

## 2018-02-27 DIAGNOSIS — I480 Paroxysmal atrial fibrillation: Secondary | ICD-10-CM | POA: Diagnosis not present

## 2018-02-27 DIAGNOSIS — I050 Rheumatic mitral stenosis: Secondary | ICD-10-CM | POA: Diagnosis not present

## 2018-03-19 DIAGNOSIS — H353221 Exudative age-related macular degeneration, left eye, with active choroidal neovascularization: Secondary | ICD-10-CM | POA: Diagnosis not present

## 2018-03-23 ENCOUNTER — Ambulatory Visit: Payer: Medicare HMO | Admitting: Family Medicine

## 2018-04-04 ENCOUNTER — Ambulatory Visit (INDEPENDENT_AMBULATORY_CARE_PROVIDER_SITE_OTHER): Payer: Medicare HMO

## 2018-04-04 ENCOUNTER — Encounter

## 2018-04-04 ENCOUNTER — Encounter: Payer: Self-pay | Admitting: Family Medicine

## 2018-04-04 ENCOUNTER — Ambulatory Visit (INDEPENDENT_AMBULATORY_CARE_PROVIDER_SITE_OTHER): Payer: Medicare HMO | Admitting: Family Medicine

## 2018-04-04 VITALS — BP 140/56 | HR 48 | Temp 97.9°F | Resp 18 | Ht 63.0 in | Wt 116.2 lb

## 2018-04-04 DIAGNOSIS — R630 Anorexia: Secondary | ICD-10-CM | POA: Diagnosis not present

## 2018-04-04 DIAGNOSIS — R198 Other specified symptoms and signs involving the digestive system and abdomen: Secondary | ICD-10-CM | POA: Diagnosis not present

## 2018-04-04 DIAGNOSIS — R634 Abnormal weight loss: Secondary | ICD-10-CM | POA: Diagnosis not present

## 2018-04-04 DIAGNOSIS — R531 Weakness: Secondary | ICD-10-CM | POA: Diagnosis not present

## 2018-04-04 DIAGNOSIS — J9811 Atelectasis: Secondary | ICD-10-CM | POA: Diagnosis not present

## 2018-04-04 LAB — URINALYSIS, ROUTINE W REFLEX MICROSCOPIC
LEUKOCYTE UA: NEGATIVE
Nitrite: NEGATIVE
Specific Gravity, Urine: 1.025 (ref 1.000–1.030)
Total Protein, Urine: 30 — AB
Urine Glucose: NEGATIVE
Urobilinogen, UA: 1 (ref 0.0–1.0)
pH: 6 (ref 5.0–8.0)

## 2018-04-04 LAB — CBC
HCT: 37.8 % (ref 36.0–46.0)
Hemoglobin: 12.3 g/dL (ref 12.0–15.0)
MCHC: 32.5 g/dL (ref 30.0–36.0)
MCV: 90.5 fl (ref 78.0–100.0)
Platelets: 268 10*3/uL (ref 150.0–400.0)
RBC: 4.18 Mil/uL (ref 3.87–5.11)
RDW: 15.9 % — ABNORMAL HIGH (ref 11.5–15.5)
WBC: 5.9 10*3/uL (ref 4.0–10.5)

## 2018-04-04 LAB — B12 AND FOLATE PANEL
Folate: 15.2 ng/mL (ref >5.9)
Vitamin B-12: 207 pg/mL — ABNORMAL LOW (ref 211–911)

## 2018-04-04 LAB — COMPREHENSIVE METABOLIC PANEL
ALT: 13 U/L (ref 0–35)
AST: 13 U/L (ref 0–37)
Albumin: 3.8 g/dL (ref 3.5–5.2)
Alkaline Phosphatase: 85 U/L (ref 39–117)
BUN: 12 mg/dL (ref 6–23)
CO2: 25 mEq/L (ref 19–32)
Calcium: 9 mg/dL (ref 8.4–10.5)
Chloride: 107 mEq/L (ref 96–112)
Creatinine, Ser: 1.01 mg/dL (ref 0.40–1.20)
GFR: 52.02 mL/min — ABNORMAL LOW (ref >60.00)
GLUCOSE: 77 mg/dL (ref 70–99)
Potassium: 4.2 mEq/L (ref 3.5–5.1)
Sodium: 141 mEq/L (ref 135–145)
Total Bilirubin: 0.9 mg/dL (ref 0.2–1.2)
Total Protein: 6.9 g/dL (ref 6.0–8.3)

## 2018-04-04 LAB — VITAMIN D 25 HYDROXY (VIT D DEFICIENCY, FRACTURES): VITD: 33.25 ng/mL (ref 30.00–100.00)

## 2018-04-04 NOTE — Progress Notes (Signed)
Subjective:    Patient ID: Frances Kent, female    DOB: Jul 04, 1930, 85 y.o.   MRN: [id number]  HPI  Patient presents to clinic complaining of generalized weakness, increased bowel movements -some days will have 3 or 4 soft stools, and then no BM for a couple of days, no appetite, fatigue that has been present since December 2019.  Patient states she just does not feel like eating.  States because she is not really eating, she may need one small snacks throughout the day or drink small amounts of water.  Sometimes will feel lightheaded if he gets up from seated position too fast.  Denies nausea or vomiting.  Denies urinary issues.  Denies abdominal pain.  Denies chest pain.  Denies shortness of breath or wheezing.  Patient does have a history of atrial fibrillation for which she takes amiodarone and metoprolol, states her heart rate usually runs in the high 40s and low 50s.  Last saw cardiology in January 2020.  Patient Active Problem List   Diagnosis Date Noted  . Lumbar radiculitis 01/07/2018  . Transaminitis 01/07/2018  . Low back pain 09/02/2017  . B12 deficiency 07/26/2017  . Basophilia 07/26/2017  . Benign breast cyst in female, left 10/07/2016  . Atrial fibrillation status post cardioversion ([place]) 04/06/2016  . Hospital discharge follow-up 03/10/2016  . Right ankle swelling 12/21/2015  . Vitamin D deficiency 04/08/2015  . Cervical spine degeneration 12/13/2014  . History of pulmonary embolism 12/02/2014  . Insomnia 10/04/2014  . Skin lesion of face 12/21/2013  . Achilles tendinosis 12/21/2013  . Generalized anxiety disorder 03/28/2013  . Mitral valve prolapse 03/28/2013  . Encounter for preventive health examination 03/23/2012  . Fatigue 03/23/2012  . IPF (idiopathic pulmonary fibrosis) ([place])   . Screening for breast cancer 11/29/2010  . Macular degeneration, left eye 11/29/2010  . Screening for colon cancer 11/29/2010  . Hyperlipidemia LDL goal <160 11/29/2010  .  GERD (gastroesophageal reflux disease)    Social History   Tobacco Use  . Smoking status: Never Smoker  . Smokeless tobacco: Never Used  . Tobacco comment: 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 , worked at [employer], [employer] Use Topics  . Alcohol use: No   Review of Systems   Constitutional: Negative for chills, and fever. +fatigue, general weakness  HENT: Negative for congestion, ear pain, sinus pain and sore throat.   Eyes: Negative.   Respiratory: Negative for cough, shortness of breath and wheezing.   Cardiovascular: Negative for chest pain, palpitations and leg swelling.  Gastrointestinal: Negative for abdominal pain, diarrhea, nausea and vomiting. No appetite, increased BMs (stools not loose or watery)  Genitourinary: Negative for dysuria, frequency and urgency.  Musculoskeletal: Negative for arthralgias and myalgias.  Skin: Negative for color change, pallor and rash.  Neurological: Negative for syncope, and headaches. +lightheaded at times  Psychiatric/Behavioral: The patient is not nervous/anxious.       Objective:   Physical Exam Vitals signs and nursing note reviewed.  Constitutional:      General: She is not in acute distress.    Appearance: She is not toxic-appearing.  HENT:     Head: Normocephalic and atraumatic.     Right Ear: Tympanic membrane, ear canal and external ear normal.     Left Ear: Tympanic membrane, ear canal and external ear normal.     Nose: Nose normal.     Mouth/Throat:     Mouth: Mucous membranes are moist.  Eyes:     General: No scleral  icterus.    Extraocular Movements: Extraocular movements intact.     Pupils: Pupils are equal, round, and reactive to light.  Neck:     Musculoskeletal: Neck supple. No neck rigidity.  Cardiovascular:     Rate and Rhythm: Regular rhythm. Bradycardia present.     Heart sounds: No murmur.  Pulmonary:     Effort: Pulmonary effort is normal. No respiratory distress.     Breath sounds: Normal breath sounds. No  wheezing, rhonchi or rales.  Abdominal:     General: Bowel sounds are normal. There is no distension.     Palpations: Abdomen is soft. There is no mass.     Tenderness: There is no abdominal tenderness. There is no guarding.     Hernia: No hernia is present.  Musculoskeletal:     Right lower leg: No edema.     Left lower leg: No edema.  Lymphadenopathy:     Cervical: No cervical adenopathy.  Skin:    General: Skin is warm and dry.     Coloration: Skin is not pale.  Neurological:     Mental Status: She is alert and oriented to person, place, and time.     Gait: Gait normal.  Psychiatric:        Mood and Affect: Mood normal.        Behavior: Behavior normal.    Wt Readings from Last 3 Encounters:  04/04/18 116 lb 3.2 oz (52.7 kg)  02/25/18 120 lb (54.4 kg)  02/01/18 120 lb (54.4 kg)   Vitals:   04/04/18 1119  BP: (!) 140/56  Pulse: (!) 48  Resp: 18  Temp: 97.9 F (36.6 C)  SpO2: 96%       Assessment & Plan:   Generalized weakness, no appetite, change in bowel movement habits, weight loss- unclear reason for patient's weakness and appetite changes.  She has lost 4 pounds since January 2020.  We will do lab work-up, repeat chest x-ray to compare to one done in emergency room in January, check urine and also do GI referral due to weight loss and change in bowel habits.  Patient given samples of Ensure in 3 different flavors to try and see what she likes, has been encouraged to drink at least 1 or 2 ensures per day and try to eat small meals high in protein and lots of vegetables.  Also encouraged to drink lots of water daily, advised to have a cup or water bottle next to her with a straw in it and sip on it throughout the day.  Patient aware someone will contact her in regards to GI referral.  She will keep planned follow-up as scheduled with PCP.

## 2018-04-05 LAB — URINE CULTURE
MICRO NUMBER:: 215429
SPECIMEN QUALITY: ADEQUATE

## 2018-04-27 ENCOUNTER — Other Ambulatory Visit: Payer: Self-pay | Admitting: Nurse Practitioner

## 2018-04-27 DIAGNOSIS — R001 Bradycardia, unspecified: Secondary | ICD-10-CM | POA: Diagnosis not present

## 2018-04-27 DIAGNOSIS — R634 Abnormal weight loss: Secondary | ICD-10-CM | POA: Diagnosis not present

## 2018-04-27 DIAGNOSIS — I480 Paroxysmal atrial fibrillation: Secondary | ICD-10-CM | POA: Diagnosis not present

## 2018-04-27 DIAGNOSIS — R630 Anorexia: Secondary | ICD-10-CM

## 2018-04-27 DIAGNOSIS — E538 Deficiency of other specified B group vitamins: Secondary | ICD-10-CM | POA: Diagnosis not present

## 2018-04-27 DIAGNOSIS — E782 Mixed hyperlipidemia: Secondary | ICD-10-CM | POA: Diagnosis not present

## 2018-04-27 DIAGNOSIS — I10 Essential (primary) hypertension: Secondary | ICD-10-CM | POA: Diagnosis not present

## 2018-04-28 DIAGNOSIS — R001 Bradycardia, unspecified: Secondary | ICD-10-CM | POA: Insufficient documentation

## 2018-04-30 DIAGNOSIS — R634 Abnormal weight loss: Secondary | ICD-10-CM | POA: Diagnosis not present

## 2018-05-02 ENCOUNTER — Ambulatory Visit
Admission: RE | Admit: 2018-05-02 | Discharge: 2018-05-02 | Disposition: A | Discharge status: Home / Self Care | Payer: Medicare HMO | Source: Ambulatory Visit | Attending: Nurse Practitioner | Admitting: Nurse Practitioner

## 2018-05-02 ENCOUNTER — Other Ambulatory Visit: Payer: Self-pay

## 2018-05-02 DIAGNOSIS — K5730 Diverticulosis of large intestine without perforation or abscess without bleeding: Secondary | ICD-10-CM | POA: Diagnosis not present

## 2018-05-02 DIAGNOSIS — R630 Anorexia: Secondary | ICD-10-CM | POA: Diagnosis not present

## 2018-05-02 DIAGNOSIS — R634 Abnormal weight loss: Secondary | ICD-10-CM | POA: Diagnosis not present

## 2018-05-02 MED ORDER — IOHEXOL 300 MG/ML  SOLN
80.0000 mL | Freq: Once | INTRAMUSCULAR | Status: AC | PRN
  Administered 2018-05-02: 80 mL via INTRAVENOUS

## 2018-05-03 ENCOUNTER — Ambulatory Visit: Payer: Medicare HMO | Admitting: Internal Medicine

## 2018-05-04 DIAGNOSIS — I2699 Other pulmonary embolism without acute cor pulmonale: Secondary | ICD-10-CM | POA: Diagnosis not present

## 2018-05-04 DIAGNOSIS — I10 Essential (primary) hypertension: Secondary | ICD-10-CM | POA: Diagnosis not present

## 2018-05-04 DIAGNOSIS — R001 Bradycardia, unspecified: Secondary | ICD-10-CM | POA: Diagnosis not present

## 2018-05-04 DIAGNOSIS — Z9189 Other specified personal risk factors, not elsewhere classified: Secondary | ICD-10-CM | POA: Diagnosis not present

## 2018-05-04 DIAGNOSIS — I480 Paroxysmal atrial fibrillation: Secondary | ICD-10-CM | POA: Diagnosis not present

## 2018-05-04 DIAGNOSIS — Z79899 Other long term (current) drug therapy: Secondary | ICD-10-CM | POA: Diagnosis not present

## 2018-05-04 DIAGNOSIS — R7989 Other specified abnormal findings of blood chemistry: Secondary | ICD-10-CM | POA: Diagnosis not present

## 2018-05-04 DIAGNOSIS — I050 Rheumatic mitral stenosis: Secondary | ICD-10-CM | POA: Diagnosis not present

## 2018-05-04 LAB — TSH: TSH: 16.9 — AB (ref 0.41–5.90)

## 2018-05-07 ENCOUNTER — Ambulatory Visit (INDEPENDENT_AMBULATORY_CARE_PROVIDER_SITE_OTHER): Payer: Medicare HMO | Admitting: Internal Medicine

## 2018-05-07 ENCOUNTER — Encounter: Payer: Self-pay | Admitting: Internal Medicine

## 2018-05-07 ENCOUNTER — Other Ambulatory Visit: Payer: Self-pay

## 2018-05-07 VITALS — BP 148/58 | HR 57 | Temp 97.5°F | Resp 16 | Ht 63.0 in | Wt 115.8 lb

## 2018-05-07 DIAGNOSIS — E039 Hypothyroidism, unspecified: Secondary | ICD-10-CM

## 2018-05-07 DIAGNOSIS — R195 Other fecal abnormalities: Secondary | ICD-10-CM | POA: Diagnosis not present

## 2018-05-07 DIAGNOSIS — R634 Abnormal weight loss: Secondary | ICD-10-CM | POA: Diagnosis not present

## 2018-05-07 DIAGNOSIS — E034 Atrophy of thyroid (acquired): Secondary | ICD-10-CM | POA: Diagnosis not present

## 2018-05-07 DIAGNOSIS — I700 Atherosclerosis of aorta: Secondary | ICD-10-CM | POA: Diagnosis not present

## 2018-05-07 DIAGNOSIS — E538 Deficiency of other specified B group vitamins: Secondary | ICD-10-CM | POA: Diagnosis not present

## 2018-05-07 DIAGNOSIS — Z789 Other specified health status: Secondary | ICD-10-CM

## 2018-05-07 DIAGNOSIS — M79604 Pain in right leg: Secondary | ICD-10-CM | POA: Diagnosis not present

## 2018-05-07 DIAGNOSIS — R110 Nausea: Secondary | ICD-10-CM | POA: Diagnosis not present

## 2018-05-07 DIAGNOSIS — M79605 Pain in left leg: Secondary | ICD-10-CM

## 2018-05-07 MED ORDER — CYANOCOBALAMIN 1000 MCG/ML IJ SOLN
1000.0000 ug | Freq: Once | INTRAMUSCULAR | Status: AC
  Administered 2018-05-07: 1000 ug via INTRAMUSCULAR

## 2018-05-07 MED ORDER — LEVOTHYROXINE SODIUM 50 MCG PO TABS: 50.0000 ug | ORAL_TABLET | Freq: Every day | ORAL | 3 refills | Status: DC

## 2018-05-07 NOTE — Patient Instructions (Signed)
Please return in one week for B12 injection.  You need 3 total before you can switch to medications   Your thyroid is underactive, so  am starting you on thyroid supplementation .  It should be taken Frances Kent  , at least 30 minutes BEFORE you eat or drink anything but water   RETURN IN 6 WEEKS TO SEE ME,  AFTER WE RECHECK YOUR THYROID LEVEL

## 2018-05-07 NOTE — Progress Notes (Signed)
Subjective:  Patient ID: Frances Kent, female    DOB: 1930-06-15  Age: 85 y.o. MRN: [id number]  CC: The primary encounter diagnosis was Acquired hypothyroidism. Diagnoses of Pain in both lower extremities, B12 deficiency, Heme positive stool, Nausea, Abdominal aortic atherosclerosis ([place]), Hypothyroidism due to acquired atrophy of thyroid, Unintentional weight loss, and Statin intolerance were also pertinent to this visit.  HPI Frances Kent presents for follow up on hypothyrodism and B12 deficiency   Treated by [facility] for unintentional weight loss and leg weakness  in Feb . UTI ruled out,  Ensure recommended and GI referral made to [place].  CT abd and pelvis was done   ast week and cologuard done. Low B12 identified by [facility]  er GI  But referred here for B12 shots .  Hemoccult was positive 1/2   Liver:  Hepatic dome cyst. Pancrease normal  No adrenal mass benign left kidney cyst  Stomach, SI and LI without inflammation or mass.  Hypothyroidism diagnosed by [facility] Bills with TSh of 17 in March 2020  Abdominal aortic atherosclerosis noted,  No aneurysm, , no LAD. Normal uterus no adnexal mass .  Has GI follow up in one month,  Dreading colonoscopy due to the prep,  But afraid she has colon CA because she has increased gas  5 lb weight loss since Feb 25 2018 ,  13 lbs since July . Was 138 in Feb 2016, now 116  Denies nausea but has anorexia and fatigue.  Legs start hurting at the top of thighs with walking  Feels better since stopping metoprolol last Friday    Outpatient Medications Prior to Visit  Medication Sig Dispense Refill  . amiodarone (PACERONE) 200 MG tablet Take 200 mg by mouth daily.     . bevacizumab (AVASTIN) 1.25 mg/0.1 mL SOLN by Intravitreal route every 6 (six) weeks. For macular degeneration    . Cholecalciferol (VITAMIN D3) 1000 units CAPS Take 1,000 Units by mouth daily.     . pantoprazole (PROTONIX) 40 MG tablet TAKE 1 TABLET BY MOUTH ONCE DAILY AS NEEDED (Patient  taking differently: Take 40 mg by mouth daily as needed (acid reflux/indigestion.). ) 90 tablet 0  . Probiotic Product (PROBIOTIC ADVANCED) CAPS Take 1 capsule by mouth.    . rivaroxaban (XARELTO) 20 MG TABS tablet Take 20 mg by mouth daily.    . traMADol (ULTRAM) 50 MG tablet Take 1 tablet (50 mg total) by mouth every 8 (eight) hours as needed. 30 tablet 0  . metoprolol tartrate (LOPRESSOR) 25 MG tablet Take 0.5 tablets (12.5 mg total) by mouth 2 (two) times daily. (Patient not taking: Reported on 05/07/2018) 30 tablet 5   No facility-administered medications prior to visit.     Review of Systems;  Patient denies headache, fevers, malaise, unintentional weight loss, skin rash, eye pain, sinus congestion and sinus pain, sore throat, dysphagia,  hemoptysis , cough, dyspnea, wheezing, chest pain, palpitations, orthopnea, edema, abdominal pain, nausea, melena, diarrhea, constipation, flank pain, dysuria, hematuria, urinary  Frequency, nocturia, numbness, tingling, seizures,  Focal weakness, Loss of consciousness,  Tremor, insomnia, depression, anxiety, and suicidal ideation.      Objective:  BP (!) 148/58 (BP Location: Left Arm, Patient Position: Sitting, Cuff Size: Normal)   Pulse (!) 57   Temp (!) 97.5 F (36.4 C) (Oral)   Resp 16   Ht 5\' 3"  (1.6 m)   Wt 115 lb 12.8 oz (52.5 kg)   SpO2 96%   BMI 20.51 kg/m  BP Readings from Last 3 Encounters:  05/07/18 (!) 148/58  04/04/18 (!) 140/56  02/25/18 (!) 148/56    Wt Readings from Last 3 Encounters:  05/07/18 115 lb 12.8 oz (52.5 kg)  04/04/18 116 lb 3.2 oz (52.7 kg)  02/25/18 120 lb (54.4 kg)    General appearance: alert, cooperative and appears stated age Ears: normal TM's and external ear canals both ears Throat: lips, mucosa, and tongue normal; teeth and gums normal Neck: no adenopathy, no carotid bruit, supple, symmetrical, trachea midline and thyroid not enlarged, symmetric, no tenderness/mass/nodules Back: symmetric, no  curvature. ROM normal. No CVA tenderness. Lungs: clear to auscultation bilaterally Heart: regular rate and rhythm, S1, S2 normal, no murmur, click, rub or gallop Abdomen: soft, non-tender; bowel sounds normal; no masses,  no organomegaly Pulses: 2+ and symmetric Skin: Skin color, texture, turgor normal. No rashes or lesions Lymph nodes: Cervical, supraclavicular, and axillary nodes normal.  No results found for: HGBA1C  Lab Results  Component Value Date   CREATININE 1.01 04/04/2018   CREATININE 1.03 (H) 02/25/2018   CREATININE 1.05 12/21/2017    Lab Results  Component Value Date   WBC 5.9 04/04/2018   HGB 12.3 04/04/2018   HCT 37.8 04/04/2018   PLT 268.0 04/04/2018   GLUCOSE 77 04/04/2018   CHOL 264 (H) 07/24/2017   TRIG 103.0 07/24/2017   HDL 73.60 07/24/2017   LDLDIRECT 185.3 03/27/2013   LDLCALC 170 (H) 07/24/2017   ALT 13 04/04/2018   AST 13 04/04/2018   NA 141 04/04/2018   K 4.2 04/04/2018   CL 107 04/04/2018   CREATININE 1.01 04/04/2018   BUN 12 04/04/2018   CO2 25 04/04/2018   TSH 16.90 (A) 05/04/2018   INR 1.03 03/04/2016    Ct Abdomen Pelvis W Contrast  Result Date: 05/02/2018 CLINICAL DATA:  Fatigue in the legs. EXAM: CT ABDOMEN AND PELVIS WITH CONTRAST TECHNIQUE: Multidetector CT imaging of the abdomen and pelvis was performed using the standard protocol following bolus administration of intravenous contrast. CONTRAST:  57mL OMNIPAQUE IOHEXOL 300 MG/ML  SOLN COMPARISON:  None. FINDINGS: Lower chest: Unremarkable Hepatobiliary: Tiny low-density subcapsular lesion in the anterior hepatic dome is likely a cyst. No suspicious focal abnormality within the liver parenchyma. Gallbladder surgically absent. Mild prominence of the bile ducts likely related to prior cholecystectomy. Pancreas: No focal mass lesion. No dilatation of the main duct. No intraparenchymal cyst. No peripancreatic edema. Spleen: No splenomegaly. No focal mass lesion. Adrenals/Urinary Tract: No  adrenal nodule or mass. 3 mm low-density lesion lower pole left kidney compatible with benign etiology such as tiny cyst or AML. The kidneys otherwise unremarkable. No evidence for hydroureter. The urinary bladder appears normal for the degree of distention. Stomach/Bowel: Stomach is unremarkable. No gastric wall thickening. No evidence of outlet obstruction. Duodenum is normally positioned as is the ligament of Treitz. No small bowel wall thickening. No small bowel dilatation. The terminal ileum is normal. The appendix is not visualized, but there is no edema or inflammation in the region of the cecum. No gross colonic mass. No colonic wall thickening. Diverticular changes are noted in the left colon without evidence of diverticulitis. Vascular/Lymphatic: There is abdominal aortic atherosclerosis without aneurysm. There is no gastrohepatic or hepatoduodenal ligament lymphadenopathy. No intraperitoneal or retroperitoneal lymphadenopathy. No pelvic sidewall lymphadenopathy. Reproductive: The uterus is unremarkable.  There is no adnexal mass. Other: No intraperitoneal free fluid. Musculoskeletal: No worrisome lytic or sclerotic osseous abnormality. IMPRESSION: 1. No acute findings in the abdomen or pelvis.  2. Left colonic diverticulosis without diverticulitis. 3.  Aortic Atherosclerois (ICD10-170.0) Electronically Signed   By: Misty Stanley M.D.   On: 05/02/2018 14:06    Assessment & Plan:   Problem List Items Addressed This Visit    B12 deficiency    Diagnosed in July 2019.  Intrinsic factor ab was negative,  But her level has fallen again with oral supplementation. Marland Kitchen  She will return for weekly b12 injections  Followed by monthly injections for life.   Lab Results  Component Value Date   VITAMINB12 207 (L) 04/04/2018          Relevant Medications   cyanocobalamin ((VITAMIN B-12)) injection 1,000 mcg (Completed)   Other Relevant Orders   IBC + Ferritin   Intrinsic Factor Antibodies   Abdominal  aortic atherosclerosis ([place])    Noted on recent CT.Marland Kitchen   Explained that she is  at high risk for stroke and heart attack, but she defers treatment       Hypothyroidism due to acquired atrophy of thyroid    TSH was  Normal in June 2019 but 17 at [facility] recently.  Synthroid started.  Repeat tsh in 6 weeks.   Lab Results  Component Value Date   TSH 16.90 (A) 05/04/2018          Relevant Medications   levothyroxine (SYNTHROID, LEVOTHROID) 50 MCG tablet   Unintentional weight loss    GI workup for occult malignancy is thus far negative.  thyroid is underactive and B12 is low       Statin intolerance    She had arthralgias during prior trial of pravastatin       Other Visit Diagnoses    Acquired hypothyroidism    -  Primary   Relevant Medications   levothyroxine (SYNTHROID, LEVOTHROID) 50 MCG tablet   Other Relevant Orders   TSH   Thyroid peroxidase antibody   Pain in both lower extremities       Relevant Orders   CK (Creatine Kinase)   Heme positive stool       Nausea        A total of 40 minutes was spent with patient more than half of which was spent in counseling patient on the above mentioned issues , reviewing and explaining recent labs and imaging studies done, and coordination of care.   I have discontinued Blanch Media A. Lennox's metoprolol tartrate. I am also having her start on levothyroxine. Additionally, I am having her maintain her bevacizumab, Vitamin D3, rivaroxaban, Probiotic Advanced, pantoprazole, amiodarone, and traMADol. We administered cyanocobalamin.  Meds ordered this encounter  Medications  . levothyroxine (SYNTHROID, LEVOTHROID) 50 MCG tablet    Sig: Take 1 tablet (50 mcg total) by mouth daily. 30 minutes prior to eating    Dispense:  90 tablet    Refill:  3  . cyanocobalamin ((VITAMIN B-12)) injection 1,000 mcg    Medications Discontinued During This Encounter  Medication Reason  . metoprolol tartrate (LOPRESSOR) 25 MG tablet      Follow-up: Return in about 6 weeks (around 06/18/2018).   [name], MD

## 2018-05-08 ENCOUNTER — Ambulatory Visit: Payer: Medicare HMO

## 2018-05-08 DIAGNOSIS — Z789 Other specified health status: Secondary | ICD-10-CM | POA: Insufficient documentation

## 2018-05-08 DIAGNOSIS — M791 Myalgia, unspecified site: Secondary | ICD-10-CM | POA: Insufficient documentation

## 2018-05-08 DIAGNOSIS — T466X5A Adverse effect of antihyperlipidemic and antiarteriosclerotic drugs, initial encounter: Secondary | ICD-10-CM | POA: Insufficient documentation

## 2018-05-08 DIAGNOSIS — E034 Atrophy of thyroid (acquired): Secondary | ICD-10-CM | POA: Insufficient documentation

## 2018-05-08 DIAGNOSIS — R634 Abnormal weight loss: Secondary | ICD-10-CM | POA: Insufficient documentation

## 2018-05-08 NOTE — Assessment & Plan Note (Addendum)
Diagnosed in July 2019.  Intrinsic factor ab was negative,  But her level has fallen again with oral supplementation. Frances Kent  She will return for weekly b12 injections  Followed by monthly injections for life.   Lab Results  Component Value Date   VITAMINB12 207 (L) 04/04/2018

## 2018-05-08 NOTE — Assessment & Plan Note (Addendum)
Noted on recent CT.Frances Kent   Explained that she is  at high risk for stroke and heart attack, but she defers treatment

## 2018-05-08 NOTE — Assessment & Plan Note (Signed)
She had arthralgias during prior trial of pravastatin

## 2018-05-08 NOTE — Assessment & Plan Note (Signed)
GI workup for occult malignancy is thus far negative.  thyroid is underactive and B12 is low

## 2018-05-08 NOTE — Assessment & Plan Note (Addendum)
TSH was  Normal in June 2019 but 17 at [facility] recently.  Synthroid started.  Repeat tsh in 6 weeks.   Lab Results  Component Value Date   TSH 16.90 (A) 05/04/2018

## 2018-05-09 ENCOUNTER — Other Ambulatory Visit (INDEPENDENT_AMBULATORY_CARE_PROVIDER_SITE_OTHER): Payer: Medicare HMO

## 2018-05-09 ENCOUNTER — Other Ambulatory Visit: Payer: Self-pay

## 2018-05-09 DIAGNOSIS — E039 Hypothyroidism, unspecified: Secondary | ICD-10-CM

## 2018-05-09 DIAGNOSIS — M79604 Pain in right leg: Secondary | ICD-10-CM

## 2018-05-09 DIAGNOSIS — E538 Deficiency of other specified B group vitamins: Secondary | ICD-10-CM | POA: Diagnosis not present

## 2018-05-09 DIAGNOSIS — M79605 Pain in left leg: Secondary | ICD-10-CM | POA: Diagnosis not present

## 2018-05-09 LAB — IBC + FERRITIN
Ferritin: 45.8 ng/mL (ref 10.0–291.0)
Iron: 95 ug/dL (ref 42–145)
SATURATION RATIOS: 25.7 % (ref 20.0–50.0)
Transferrin: 264 mg/dL (ref 212.0–360.0)

## 2018-05-09 LAB — TSH: TSH: 17.01 u[IU]/mL — ABNORMAL HIGH (ref 0.35–4.50)

## 2018-05-09 LAB — CK: Total CK: 83 U/L (ref 7–177)

## 2018-05-11 LAB — THYROID PEROXIDASE ANTIBODY

## 2018-05-11 LAB — INTRINSIC FACTOR ANTIBODIES: Intrinsic Factor: NEGATIVE

## 2018-05-15 ENCOUNTER — Other Ambulatory Visit: Payer: Self-pay

## 2018-05-15 ENCOUNTER — Ambulatory Visit (INDEPENDENT_AMBULATORY_CARE_PROVIDER_SITE_OTHER): Payer: Medicare HMO

## 2018-05-15 DIAGNOSIS — E538 Deficiency of other specified B group vitamins: Secondary | ICD-10-CM

## 2018-05-15 MED ORDER — CYANOCOBALAMIN 1000 MCG/ML IJ SOLN
1000.0000 ug | Freq: Once | INTRAMUSCULAR | Status: AC
  Administered 2018-05-15: 1000 ug via INTRAMUSCULAR

## 2018-05-15 NOTE — Progress Notes (Signed)
Frances Kent presents today for injection per MD orders. B12 injection administered IM in left Upper Arm. Administration without incident. Patient tolerated well.  Greyson Peavy,cma

## 2018-05-23 ENCOUNTER — Ambulatory Visit (INDEPENDENT_AMBULATORY_CARE_PROVIDER_SITE_OTHER): Payer: Medicare HMO

## 2018-05-23 ENCOUNTER — Other Ambulatory Visit: Payer: Self-pay

## 2018-05-23 DIAGNOSIS — E538 Deficiency of other specified B group vitamins: Secondary | ICD-10-CM

## 2018-05-23 MED ORDER — CYANOCOBALAMIN 1000 MCG/ML IJ SOLN
1000.0000 ug | Freq: Once | INTRAMUSCULAR | Status: AC
  Administered 2018-05-23: 10:00:00 1000 ug via INTRAMUSCULAR

## 2018-05-23 NOTE — Progress Notes (Signed)
Frances Kent presents today for injection per MD orders. B12 injeciton administered IM in right Upper Arm. Administration without incident. Patient tolerated well. Nina,cma

## 2018-05-23 NOTE — Progress Notes (Signed)
  I have reviewed the above information and agree with above.   Teresa Tullo, MD 

## 2018-05-31 DIAGNOSIS — R001 Bradycardia, unspecified: Secondary | ICD-10-CM | POA: Diagnosis not present

## 2018-05-31 DIAGNOSIS — I10 Essential (primary) hypertension: Secondary | ICD-10-CM | POA: Diagnosis not present

## 2018-05-31 DIAGNOSIS — I341 Nonrheumatic mitral (valve) prolapse: Secondary | ICD-10-CM | POA: Diagnosis not present

## 2018-05-31 DIAGNOSIS — I480 Paroxysmal atrial fibrillation: Secondary | ICD-10-CM | POA: Diagnosis not present

## 2018-05-31 DIAGNOSIS — I071 Rheumatic tricuspid insufficiency: Secondary | ICD-10-CM | POA: Diagnosis not present

## 2018-06-25 ENCOUNTER — Ambulatory Visit: Payer: Medicare HMO | Admitting: Internal Medicine

## 2018-07-17 DIAGNOSIS — H353221 Exudative age-related macular degeneration, left eye, with active choroidal neovascularization: Secondary | ICD-10-CM | POA: Diagnosis not present

## 2018-07-26 ENCOUNTER — Ambulatory Visit: Payer: Medicare HMO

## 2018-07-26 ENCOUNTER — Encounter: Payer: Medicare HMO | Admitting: Internal Medicine

## 2018-08-07 DIAGNOSIS — R198 Other specified symptoms and signs involving the digestive system and abdomen: Secondary | ICD-10-CM | POA: Diagnosis not present

## 2018-08-07 DIAGNOSIS — R195 Other fecal abnormalities: Secondary | ICD-10-CM | POA: Diagnosis not present

## 2018-08-07 DIAGNOSIS — R634 Abnormal weight loss: Secondary | ICD-10-CM | POA: Diagnosis not present

## 2018-08-07 DIAGNOSIS — E039 Hypothyroidism, unspecified: Secondary | ICD-10-CM | POA: Diagnosis not present

## 2018-08-14 ENCOUNTER — Telehealth: Payer: Self-pay

## 2018-08-14 DIAGNOSIS — R0602 Shortness of breath: Secondary | ICD-10-CM | POA: Diagnosis not present

## 2018-08-14 DIAGNOSIS — I2699 Other pulmonary embolism without acute cor pulmonale: Secondary | ICD-10-CM | POA: Diagnosis not present

## 2018-08-14 DIAGNOSIS — I071 Rheumatic tricuspid insufficiency: Secondary | ICD-10-CM | POA: Diagnosis not present

## 2018-08-14 DIAGNOSIS — R5383 Other fatigue: Secondary | ICD-10-CM | POA: Diagnosis not present

## 2018-08-14 DIAGNOSIS — E782 Mixed hyperlipidemia: Secondary | ICD-10-CM | POA: Diagnosis not present

## 2018-08-14 DIAGNOSIS — Z79899 Other long term (current) drug therapy: Secondary | ICD-10-CM | POA: Diagnosis not present

## 2018-08-14 DIAGNOSIS — I10 Essential (primary) hypertension: Secondary | ICD-10-CM | POA: Diagnosis not present

## 2018-08-14 DIAGNOSIS — I480 Paroxysmal atrial fibrillation: Secondary | ICD-10-CM | POA: Diagnosis not present

## 2018-08-14 DIAGNOSIS — I341 Nonrheumatic mitral (valve) prolapse: Secondary | ICD-10-CM | POA: Diagnosis not present

## 2018-08-14 MED ORDER — ALPRAZOLAM 0.25 MG PO TABS: 0.2500 mg | ORAL_TABLET | Freq: Two times a day (BID) | ORAL | 0 refills | Status: DC | PRN

## 2018-08-14 NOTE — Telephone Encounter (Signed)
Copied from [place] [id number]. Topic: General - Other >> Aug 14, 2018 11:39 AM Jodie Echevaria wrote: Reason for CRM: Patient daughter Frances Kent called to say that patient is having some difficulties. States that she is nervous and restless seems af if she is not coping very well since the death of her Son in law Frances Kent a few days ago. Asking for something to calm her down please and help her to sleep. Please advise Frances Kent can be reached at Ph# [phone] or [phone]

## 2018-08-14 NOTE — Telephone Encounter (Signed)
Alprazolam sent to [facility], have her start with 1/2 tablet every 12 hours if needed

## 2018-08-15 ENCOUNTER — Inpatient Hospital Stay
Admit: 2018-08-15 | Discharge: 2018-08-15 | Disposition: A | Discharge status: Home / Self Care | Payer: Medicare Other | Attending: Specialist | Admitting: Specialist

## 2018-08-15 ENCOUNTER — Emergency Department: Payer: Medicare Other

## 2018-08-15 ENCOUNTER — Other Ambulatory Visit: Payer: Self-pay

## 2018-08-15 ENCOUNTER — Ambulatory Visit: Payer: Self-pay | Admitting: *Deleted

## 2018-08-15 ENCOUNTER — Encounter: Payer: Self-pay | Admitting: Emergency Medicine

## 2018-08-15 ENCOUNTER — Inpatient Hospital Stay
Admission: EM | Admit: 2018-08-15 | Discharge: 2018-08-17 | DRG: 292 | Disposition: A | Discharge status: Home / Self Care | Payer: Medicare Other | Attending: Internal Medicine | Admitting: Internal Medicine

## 2018-08-15 DIAGNOSIS — Z7901 Long term (current) use of anticoagulants: Secondary | ICD-10-CM

## 2018-08-15 DIAGNOSIS — I340 Nonrheumatic mitral (valve) insufficiency: Secondary | ICD-10-CM | POA: Diagnosis not present

## 2018-08-15 DIAGNOSIS — Z803 Family history of malignant neoplasm of breast: Secondary | ICD-10-CM | POA: Diagnosis not present

## 2018-08-15 DIAGNOSIS — I452 Bifascicular block: Secondary | ICD-10-CM | POA: Diagnosis present

## 2018-08-15 DIAGNOSIS — R7989 Other specified abnormal findings of blood chemistry: Secondary | ICD-10-CM | POA: Diagnosis not present

## 2018-08-15 DIAGNOSIS — I4891 Unspecified atrial fibrillation: Secondary | ICD-10-CM | POA: Diagnosis not present

## 2018-08-15 DIAGNOSIS — I341 Nonrheumatic mitral (valve) prolapse: Secondary | ICD-10-CM | POA: Diagnosis not present

## 2018-08-15 DIAGNOSIS — Z86711 Personal history of pulmonary embolism: Secondary | ICD-10-CM

## 2018-08-15 DIAGNOSIS — I509 Heart failure, unspecified: Secondary | ICD-10-CM

## 2018-08-15 DIAGNOSIS — J84112 Idiopathic pulmonary fibrosis: Secondary | ICD-10-CM | POA: Diagnosis not present

## 2018-08-15 DIAGNOSIS — Z8249 Family history of ischemic heart disease and other diseases of the circulatory system: Secondary | ICD-10-CM

## 2018-08-15 DIAGNOSIS — I5031 Acute diastolic (congestive) heart failure: Secondary | ICD-10-CM | POA: Diagnosis not present

## 2018-08-15 DIAGNOSIS — E039 Hypothyroidism, unspecified: Secondary | ICD-10-CM | POA: Diagnosis present

## 2018-08-15 DIAGNOSIS — E785 Hyperlipidemia, unspecified: Secondary | ICD-10-CM | POA: Diagnosis not present

## 2018-08-15 DIAGNOSIS — K219 Gastro-esophageal reflux disease without esophagitis: Secondary | ICD-10-CM | POA: Diagnosis present

## 2018-08-15 DIAGNOSIS — I110 Hypertensive heart disease with heart failure: Secondary | ICD-10-CM | POA: Diagnosis not present

## 2018-08-15 DIAGNOSIS — Z20828 Contact with and (suspected) exposure to other viral communicable diseases: Secondary | ICD-10-CM | POA: Diagnosis not present

## 2018-08-15 DIAGNOSIS — R0602 Shortness of breath: Secondary | ICD-10-CM | POA: Diagnosis not present

## 2018-08-15 DIAGNOSIS — Z79890 Hormone replacement therapy (postmenopausal): Secondary | ICD-10-CM

## 2018-08-15 DIAGNOSIS — I248 Other forms of acute ischemic heart disease: Secondary | ICD-10-CM | POA: Diagnosis not present

## 2018-08-15 DIAGNOSIS — I5043 Acute on chronic combined systolic (congestive) and diastolic (congestive) heart failure: Secondary | ICD-10-CM | POA: Diagnosis present

## 2018-08-15 DIAGNOSIS — I5023 Acute on chronic systolic (congestive) heart failure: Secondary | ICD-10-CM | POA: Diagnosis not present

## 2018-08-15 DIAGNOSIS — I480 Paroxysmal atrial fibrillation: Secondary | ICD-10-CM | POA: Diagnosis not present

## 2018-08-15 DIAGNOSIS — I5022 Chronic systolic (congestive) heart failure: Secondary | ICD-10-CM

## 2018-08-15 DIAGNOSIS — Z823 Family history of stroke: Secondary | ICD-10-CM | POA: Diagnosis not present

## 2018-08-15 DIAGNOSIS — I5032 Chronic diastolic (congestive) heart failure: Secondary | ICD-10-CM

## 2018-08-15 LAB — CBC WITH DIFFERENTIAL/PLATELET
Abs Immature Granulocytes: 0.03 10*3/uL (ref 0.00–0.07)
Basophils Absolute: 0.1 10*3/uL (ref 0.0–0.1)
Basophils Relative: 1 %
Eosinophils Absolute: 0 10*3/uL (ref 0.0–0.5)
Eosinophils Relative: 0 %
HCT: 38.7 % (ref 36.0–46.0)
Hemoglobin: 12.1 g/dL (ref 12.0–15.0)
Immature Granulocytes: 0 %
Lymphocytes Relative: 9 %
Lymphs Abs: 0.8 10*3/uL (ref 0.7–4.0)
MCH: 29.5 pg (ref 26.0–34.0)
MCHC: 31.3 g/dL (ref 30.0–36.0)
MCV: 94.4 fL (ref 80.0–100.0)
Monocytes Absolute: 0.7 10*3/uL (ref 0.1–1.0)
Monocytes Relative: 8 %
Neutro Abs: 7.4 10*3/uL (ref 1.7–7.7)
Neutrophils Relative %: 82 %
Platelets: 143 10*3/uL — ABNORMAL LOW (ref 150–400)
RBC: 4.1 MIL/uL (ref 3.87–5.11)
RDW: 15.9 % — ABNORMAL HIGH (ref 11.5–15.5)
WBC: 9.1 10*3/uL (ref 4.0–10.5)
nRBC: 0 % (ref 0.0–0.2)

## 2018-08-15 LAB — COMPREHENSIVE METABOLIC PANEL
ALT: 48 U/L — ABNORMAL HIGH (ref 0–44)
ALT: 52 U/L — ABNORMAL HIGH (ref 0–44)
AST: 41 U/L (ref 15–41)
AST: 40 U/L (ref 15–41)
Albumin: 3.6 g/dL (ref 3.5–5.0)
Albumin: 3.7 g/dL (ref 3.5–5.0)
Alkaline Phosphatase: 88 U/L (ref 38–126)
Alkaline Phosphatase: 93 U/L (ref 38–126)
Anion gap: 13 (ref 5–15)
Anion gap: 12 (ref 5–15)
BUN: 16 mg/dL (ref 8–23)
BUN: 16 mg/dL (ref 8–23)
CO2: 19 mmol/L — ABNORMAL LOW (ref 22–32)
CO2: 23 mmol/L (ref 22–32)
Calcium: 8.6 mg/dL — ABNORMAL LOW (ref 8.9–10.3)
Calcium: 8.7 mg/dL — ABNORMAL LOW (ref 8.9–10.3)
Chloride: 108 mmol/L (ref 98–111)
Chloride: 107 mmol/L (ref 98–111)
Creatinine, Ser: 1.13 mg/dL — ABNORMAL HIGH (ref 0.44–1.00)
Creatinine, Ser: 1.11 mg/dL — ABNORMAL HIGH (ref 0.44–1.00)
GFR calc Af Amer: 51 mL/min — ABNORMAL LOW (ref >60)
GFR calc Af Amer: 52 mL/min — ABNORMAL LOW (ref >60)
GFR calc non Af Amer: 44 mL/min — ABNORMAL LOW (ref >60)
GFR calc non Af Amer: 45 mL/min — ABNORMAL LOW (ref >60)
Glucose, Bld: 104 mg/dL — ABNORMAL HIGH (ref 70–99)
Glucose, Bld: 111 mg/dL — ABNORMAL HIGH (ref 70–99)
Potassium: 4.2 mmol/L (ref 3.5–5.1)
Potassium: 3.6 mmol/L (ref 3.5–5.1)
Sodium: 140 mmol/L (ref 135–145)
Sodium: 142 mmol/L (ref 135–145)
Total Bilirubin: 3 mg/dL — ABNORMAL HIGH (ref 0.3–1.2)
Total Bilirubin: 2.8 mg/dL — ABNORMAL HIGH (ref 0.3–1.2)
Total Protein: 6.7 g/dL (ref 6.5–8.1)
Total Protein: 6.7 g/dL (ref 6.5–8.1)

## 2018-08-15 LAB — BRAIN NATRIURETIC PEPTIDE: B Natriuretic Peptide: 1341 pg/mL — ABNORMAL HIGH (ref 0.0–100.0)

## 2018-08-15 LAB — TROPONIN I (HIGH SENSITIVITY)
Troponin I (High Sensitivity): 75 ng/L — ABNORMAL HIGH (ref <18)
Troponin I (High Sensitivity): 107 ng/L (ref <18)
Troponin I (High Sensitivity): 196 ng/L (ref <18)
Troponin I (High Sensitivity): 162 ng/L (ref <18)

## 2018-08-15 LAB — SARS CORONAVIRUS 2 BY RT PCR (HOSPITAL ORDER, PERFORMED IN ~~LOC~~ HOSPITAL LAB): SARS Coronavirus 2: NEGATIVE

## 2018-08-15 MED ORDER — AMIODARONE HCL 200 MG PO TABS
200.0000 mg | ORAL_TABLET | Freq: Every day | ORAL | Status: DC
  Administered 2018-08-15 – 2018-08-17 (×3): 200 mg via ORAL
  Filled 2018-08-15 (×3): qty 1

## 2018-08-15 MED ORDER — ALBUTEROL SULFATE (2.5 MG/3ML) 0.083% IN NEBU
5.0000 mg | INHALATION_SOLUTION | Freq: Once | RESPIRATORY_TRACT | Status: AC
  Administered 2018-08-15: 5 mg via RESPIRATORY_TRACT
  Filled 2018-08-15: qty 6

## 2018-08-15 MED ORDER — SODIUM CHLORIDE 0.9 % IV SOLN: 250.0000 mL | INTRAVENOUS | Status: DC | PRN

## 2018-08-15 MED ORDER — ONDANSETRON HCL 4 MG/2ML IJ SOLN: 4.0000 mg | Freq: Four times a day (QID) | INTRAMUSCULAR | Status: DC | PRN

## 2018-08-15 MED ORDER — FUROSEMIDE 10 MG/ML IJ SOLN
20.0000 mg | Freq: Two times a day (BID) | INTRAMUSCULAR | Status: DC
  Administered 2018-08-15 – 2018-08-17 (×4): 20 mg via INTRAVENOUS
  Filled 2018-08-15 (×4): qty 2

## 2018-08-15 MED ORDER — LISINOPRIL 5 MG PO TABS
2.5000 mg | ORAL_TABLET | Freq: Every day | ORAL | Status: DC
  Administered 2018-08-15 – 2018-08-17 (×3): 2.5 mg via ORAL
  Filled 2018-08-15 (×3): qty 1

## 2018-08-15 MED ORDER — SODIUM CHLORIDE 0.9% FLUSH: 3.0000 mL | INTRAVENOUS | Status: DC | PRN

## 2018-08-15 MED ORDER — RIVAROXABAN 20 MG PO TABS
20.0000 mg | ORAL_TABLET | Freq: Every day | ORAL | Status: DC
  Administered 2018-08-15 – 2018-08-16 (×2): 20 mg via ORAL
  Filled 2018-08-15 (×2): qty 1

## 2018-08-15 MED ORDER — SODIUM CHLORIDE 0.9% FLUSH
3.0000 mL | Freq: Two times a day (BID) | INTRAVENOUS | Status: DC
  Administered 2018-08-15 – 2018-08-17 (×4): 3 mL via INTRAVENOUS

## 2018-08-15 MED ORDER — FUROSEMIDE 10 MG/ML IJ SOLN
40.0000 mg | Freq: Once | INTRAMUSCULAR | Status: AC
  Administered 2018-08-15: 40 mg via INTRAVENOUS
  Filled 2018-08-15: qty 4

## 2018-08-15 MED ORDER — VITAMIN D3 25 MCG (1000 UNIT) PO TABS
1000.0000 [IU] | ORAL_TABLET | Freq: Every day | ORAL | Status: DC
  Administered 2018-08-16 – 2018-08-17 (×2): 1000 [IU] via ORAL
  Filled 2018-08-15 (×5): qty 1

## 2018-08-15 MED ORDER — ACETAMINOPHEN 325 MG PO TABS: 650.0000 mg | ORAL_TABLET | ORAL | Status: DC | PRN

## 2018-08-15 MED ORDER — LEVOTHYROXINE SODIUM 50 MCG PO TABS
50.0000 ug | ORAL_TABLET | Freq: Every day | ORAL | Status: DC
  Administered 2018-08-16 – 2018-08-17 (×2): 50 ug via ORAL
  Filled 2018-08-15 (×2): qty 1

## 2018-08-15 NOTE — ED Notes (Signed)
Report given to 2A RN

## 2018-08-15 NOTE — ED Notes (Signed)
Dr. Ellender Hose informed of elevated trop

## 2018-08-15 NOTE — ED Notes (Signed)
Pt O2 88% after ambulating to restroom, placed back on 3L, O2 now 96%

## 2018-08-15 NOTE — Telephone Encounter (Signed)
Patient experiencing shortness of breath, cough, chills., no fever for the last 2 days. symptoms are off and on. Attempted to reach NT but the call volume is high. Advised caller (daughter) if symptoms fail to improve advise patient to go to Urgent Care. The daughter expressed hesitation due to [place]. Please advise (Beat contact # for patient [phone] please try 2x) due to patient age.    Called patient back- she is on the way to ED- advised daughter she can be tested there and get help with her breathing there as well- she will let patient know PCP reached out.

## 2018-08-15 NOTE — ED Triage Notes (Signed)
Presents with some SOB and weakness  For about 1 week  Also has had non productive cough

## 2018-08-15 NOTE — Telephone Encounter (Signed)
Confirmed patient on her way to ER daughter in law driving.

## 2018-08-15 NOTE — Progress Notes (Signed)
   [place] at [place] [facility] Day: 0 days Frances Kent is a 85 y.o. female past medical history of atrial fibrillation, previous pulmonary embolism, hypertension, hypothyroidism presenting with Shortness of Breath, Weakness, and Cough .   Asked the patient if she had a living well, explained to her the difference between DNR and a full code.  Initially patient said that she does not want aggressive measures but she wants to be given a fair chance and does not want to be kept alive on machines.  Patient presently wants to be a full code.  Advance care planning discussed with patient  without additional Family at bedside. All questions in regards to overall condition and expected prognosis answered. The decision was made to continue current code status  CODE STATUS: full Time spent: 16 minutes

## 2018-08-15 NOTE — ED Notes (Signed)
Pt provided with orange juice and repositioned in bed. Pt able to slide herself up in bed without assistance

## 2018-08-15 NOTE — Progress Notes (Signed)
Patient alert and oriented x4. Settled in 248, telemetry box on. Bed in lowest position with call bell within reach. Yellow socks on. Box verified with Gerald Stabs. No pain. Will continue to monitor patient. Patient declined call to family member for update, stated she would call her daughter.

## 2018-08-15 NOTE — Plan of Care (Signed)
  Problem: Education: Goal: Knowledge of General Education information will improve Description: Including pain rating scale, medication(s)/side effects and non-pharmacologic comfort measures Outcome: Progressing   Problem: Clinical Measurements: Goal: Respiratory complications will improve Outcome: Progressing Note: Pt on room air     Problem: Activity: Goal: Risk for activity intolerance will decrease Outcome: Progressing   Problem: Safety: Goal: Ability to remain free from injury will improve Outcome: Progressing   Problem: Education: Goal: Ability to demonstrate management of disease process will improve Outcome: Progressing

## 2018-08-15 NOTE — H&P (Signed)
[place] at [place] NAME: Frances Kent    MR#:  [id number]  DATE OF BIRTH:  04/25/1930  DATE OF ADMISSION:  08/15/2018  PRIMARY CARE PHYSICIAN: [name], MD   REQUESTING/REFERRING PHYSICIAN: Dr. Eugene Gavia  CHIEF COMPLAINT:   Chief Complaint  Patient presents with  . Shortness of Breath  . Weakness  . Cough    HISTORY OF PRESENT ILLNESS:  Frances Kent  is a 85 y.o. female with a known history of mitral valve prolapse, atrial fibrillation, GERD, history of pulmonary embolism who presents to the hospital due to worsening shortness of breath.  Patient says she has been feeling more short of breath and weak over the past week or so progressively getting worse.  Today feet she felt significantly short of breath even at rest on minimal exertion and therefore came to the ER for further evaluation.  Patient underwent a chest x-ray which showed mild pulmonary edema with cardiomegaly.  She was clinically diagnosed with having decompensated congestive heart failure and therefore hospitalist services were contacted for admission.  Patient denies any fever, cough, loss of taste, sick contacts or recent travel history.  Patient's COVID-19 test is negative.  Patient denies any chest pains abdominal pain melena hematochezia or any other associated symptoms.  PAST MEDICAL HISTORY:   Past Medical History:  Diagnosis Date  . Atrial fibrillation ([place])   . GERD (gastroesophageal reflux disease)   . IPF (idiopathic pulmonary fibrosis) ([place]) 20111   by CT, PFTS done by Humphrey Rolls  . Mitral regurgitation   . Pulmonary embolism ([place]) 02/2016    PAST SURGICAL HISTORY:   Past Surgical History:  Procedure Laterality Date  . APPENDECTOMY  1960  . CARDIOVERSION N/A 04/05/2016   Procedure: Cardioversion;  Surgeon: [name], MD;  Location: [facility] ORS;  Service: Cardiovascular;  Laterality: N/A;  . CHOLECYSTECTOMY  1985  . OVARIAN CYST REMOVAL    . TEE  WITHOUT CARDIOVERSION N/A 02/01/2018   Procedure: TRANSESOPHAGEAL ECHOCARDIOGRAM (TEE);  Surgeon: [name], MD;  Location: [facility] ORS;  Service: Cardiovascular;  Laterality: N/A;    SOCIAL HISTORY:   Social History   Tobacco Use  . Smoking status: Never Smoker  . Smokeless tobacco: Never Used  . Tobacco comment: 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 , worked at [employer], [employer] Use Topics  . Alcohol use: No    FAMILY HISTORY:   Family History  Problem Relation Age of Onset  . Cancer Mother 62       breast cancer, lived to 49,   . Breast cancer Mother 20  . Cancer Son 62       pancreatic cancer  . Heart disease Son        CAD, Tobacco Abuse   . Heart disease Maternal Grandmother 21       died of massive MI  . Stroke Sister     DRUG ALLERGIES:  No Known Allergies  REVIEW OF SYSTEMS:   Review of Systems  Constitutional: Negative for fever and weight loss.  HENT: Negative for congestion, nosebleeds and tinnitus.   Eyes: Negative for blurred vision, double vision and redness.  Respiratory: Positive for shortness of breath. Negative for cough and hemoptysis.   Cardiovascular: Negative for chest pain, orthopnea, leg swelling and PND.  Gastrointestinal: Negative for abdominal pain, diarrhea, melena, nausea and vomiting.  Genitourinary: Negative for dysuria, hematuria and urgency.  Musculoskeletal: Negative for falls and joint pain.  Neurological: Positive for weakness.  Negative for dizziness, tingling, sensory change, focal weakness, seizures and headaches.  Endo/Heme/Allergies: Negative for polydipsia. Does not bruise/bleed easily.  Psychiatric/Behavioral: Negative for depression and memory loss. The patient is not nervous/anxious.     MEDICATIONS AT HOME:   Prior to Admission medications   Medication Sig Start Date End Date Taking? Authorizing Provider  amiodarone (PACERONE) 200 MG tablet Take 200 mg by mouth daily.    Yes [provider]  Cholecalciferol  (VITAMIN D3) 1000 units CAPS Take 1,000 Units by mouth daily.    Yes [provider]  levothyroxine (SYNTHROID, LEVOTHROID) 50 MCG tablet Take 1 tablet (50 mcg total) by mouth daily. 30 minutes prior to eating 05/07/18  Yes [name], MD  Probiotic Product (PROBIOTIC ADVANCED) CAPS Take 1 capsule by mouth.   Yes [provider]  rivaroxaban (XARELTO) 20 MG TABS tablet Take 20 mg by mouth daily.   Yes [provider]  ALPRAZolam (XANAX) 0.25 MG tablet Take 1 tablet (0.25 mg total) by mouth 2 (two) times daily as needed for anxiety. Patient not taking: Reported on 08/15/2018 08/14/18   [name], MD  pantoprazole (PROTONIX) 40 MG tablet TAKE 1 TABLET BY MOUTH ONCE DAILY AS NEEDED Patient not taking: No sig reported 11/09/17   [name], MD  traMADol (ULTRAM) 50 MG tablet Take 1 tablet (50 mg total) by mouth every 8 (eight) hours as needed. Patient not taking: Reported on 08/15/2018 01/05/18   [name], MD      VITAL SIGNS:  Blood pressure (!) 110/58, pulse 79, temperature 98.5 F (36.9 C), temperature source Oral, resp. rate (!) 25, height 5\' 3"  (1.6 m), weight 51.3 kg, SpO2 98 %.  PHYSICAL EXAMINATION:  Physical Exam  GENERAL:  85 y.o.-year-old patient lying in the bed in no acute distress.  EYES: Pupils equal, round, reactive to light and accommodation. No scleral icterus. Extraocular muscles intact.  HEENT: Head atraumatic, normocephalic. Oropharynx and nasopharynx clear. No oropharyngeal erythema, moist oral mucosa  NECK:  Supple, no jugular venous distention. No thyroid enlargement, no tenderness.  LUNGS: Normal breath sounds bilaterally, no wheezing, bibasilar rales, rhonchi. No use of accessory muscles of respiration.  CARDIOVASCULAR: S1, S2 RRR. No murmurs, rubs, gallops, clicks.  ABDOMEN: Soft, nontender, nondistended. Bowel sounds present. No organomegaly or mass.  EXTREMITIES: No pedal edema, cyanosis, or clubbing. + 2 pedal & radial  pulses b/l.   NEUROLOGIC: Cranial nerves II through XII are intact. No focal Motor or sensory deficits appreciated b/l PSYCHIATRIC: The patient is alert and oriented x 3.  SKIN: No obvious rash, lesion, or ulcer.   LABORATORY PANEL:   CBC Recent Labs  Lab 08/15/18 1037  WBC 9.1  HGB 12.1  HCT 38.7  PLT 143*   ------------------------------------------------------------------------------------------------------------------  Chemistries  Recent Labs  Lab 08/15/18 1221  NA 142  K 3.6  CL 107  CO2 23  GLUCOSE 111*  BUN 16  CREATININE 1.11*  CALCIUM 8.7*  AST 40  ALT 52*  ALKPHOS 93  BILITOT 2.8*   ------------------------------------------------------------------------------------------------------------------  Cardiac Enzymes No results for input(s): TROPONINI in the last 168 hours. ------------------------------------------------------------------------------------------------------------------  RADIOLOGY:  Dg Chest 2 View  Result Date: 08/15/2018 CLINICAL DATA:  Shortness of breath EXAM: CHEST - 2 VIEW COMPARISON:  04/04/2018 FINDINGS: Cardiomegaly. Cephalized blood flow and increased interstitial coarsening with a few Kerley lines. No air bronchogram, effusion, or pneumothorax. Exaggerated thoracic kyphosis. IMPRESSION: Cardiomegaly and mild pulmonary edema. Electronically Signed   By: Angelica Chessman  Watts M.D.   On: 08/15/2018 10:24     IMPRESSION AND PLAN:   85 y.o. female with a known history of mitral valve prolapse, atrial fibrillation, GERD, history of pulmonary embolism who presents to the hospital due to worsening shortness of breath.  1.  CHF-patient presents to the hospital with worsening shortness of breath on exertion and even at rest and had chest x-ray findings suggestive of pulmonary edema and cardiomegaly. - His echo in December of last year showed normal EF of 50 to 55%.  Suspected to be acute on chronic diastolic dysfunction.  I will repeat  echocardiogram though. -We will gently diurese the patient with IV Lasix, follow I's and O's and daily weights. - Started patient on low-dose lisinopril.  Will get cardiology consult.  2.  Elevated troponin- patient has mildly elevated high-sensitivity troponin.  This is likely secondary supply demand ischemia from CHF. -We will observe on telemetry, cycle her markers, check echocardiogram as mentioned. -Await further cardiology input.  3.  History of atrial fibrillation-rate controlled.  Continue amiodarone. -Continue Xarelto  4.  History of previous pulmonary embolism-continue Xarelto.  5.  GERD-continue Protonix.  6.  Hypothyroidism-continue Synthroid.  All the records are reviewed and case discussed with ED provider. Management plans discussed with the patient, family and they are in agreement.  CODE STATUS: Full code  TOTAL TIME TAKING CARE OF THIS PATIENT: 40 minutes.    Henreitta Leber M.D on 08/15/2018 at 4:37 PM  Between 7am to 6pm - Pager - [phone]  After 6pm go to [email or web address] - password [email or web address]  Office  [phone]  CC: Primary care physician; [name], MD

## 2018-08-15 NOTE — ED Notes (Signed)
Pt lying in bed speaking with this RN in NAD, A&Ox4.

## 2018-08-15 NOTE — ED Notes (Signed)
Secretary on 2A informed that due to surge red 2A RN will need to come and get patient from the ER

## 2018-08-15 NOTE — ED Provider Notes (Signed)
[facility] Department Provider Note  ____________________________________________   First MD Initiated Contact with Patient 08/15/18 1006     (approximate)  I have reviewed the triage vital signs and the nursing notes.   HISTORY  Chief Complaint Shortness of Breath, Weakness, and Cough    HPI Frances Kent is a 85 y.o. female here with shortness of breath.  The patient has a past medical history as below including idiopathic pulmonary fibrosis, PE, A. fib, heart failure, here with shortness of breath.  The patient states that for the last several days, she is had progressively worsening shortness of breath.  She describes it as severe dyspnea, worse whenever she goes to bed or lays flat, but denies any leg swelling.  She is had no associated chest pain.  No fevers or chills.  Her cough has been dry, worse at night as well.  She states that over the last day, she is had extreme fatigue and shortness of breath with any amount of exertion, and is having difficulty just getting around the house.  No recent travel.  No unilateral leg swelling.  She been compliant with her blood thinners.        Past Medical History:  Diagnosis Date  . Atrial fibrillation ([place])   . GERD (gastroesophageal reflux disease)   . IPF (idiopathic pulmonary fibrosis) ([place]) 20111   by CT, PFTS done by Humphrey Rolls  . Mitral regurgitation   . Pulmonary embolism ([place]) 02/2016    Patient Active Problem List   Diagnosis Date Noted  . Hypothyroidism due to acquired atrophy of thyroid 05/08/2018  . Unintentional weight loss 05/08/2018  . Statin intolerance 05/08/2018  . Abdominal aortic atherosclerosis ([place]) 05/07/2018  . Lumbar radiculitis 01/07/2018  . Transaminitis 01/07/2018  . Low back pain 09/02/2017  . B12 deficiency 07/26/2017  . Basophilia 07/26/2017  . Benign breast cyst in female, left 10/07/2016  . Atrial fibrillation status post cardioversion ([place]) 04/06/2016  .  Hospital discharge follow-up 03/10/2016  . Right ankle swelling 12/21/2015  . Vitamin D deficiency 04/08/2015  . Cervical spine degeneration 12/13/2014  . History of pulmonary embolism 12/02/2014  . Insomnia 10/04/2014  . Skin lesion of face 12/21/2013  . Achilles tendinosis 12/21/2013  . Generalized anxiety disorder 03/28/2013  . Mitral valve prolapse 03/28/2013  . Encounter for preventive health examination 03/23/2012  . Fatigue 03/23/2012  . IPF (idiopathic pulmonary fibrosis) ([place])   . Screening for breast cancer 11/29/2010  . Macular degeneration, left eye 11/29/2010  . Screening for colon cancer 11/29/2010  . Hyperlipidemia LDL goal <160 11/29/2010  . GERD (gastroesophageal reflux disease)     Past Surgical History:  Procedure Laterality Date  . APPENDECTOMY  1960  . CARDIOVERSION N/A 04/05/2016   Procedure: Cardioversion;  Surgeon: [name], MD;  Location: [facility] ORS;  Service: Cardiovascular;  Laterality: N/A;  . CHOLECYSTECTOMY  1985  . OVARIAN CYST REMOVAL    . TEE WITHOUT CARDIOVERSION N/A 02/01/2018   Procedure: TRANSESOPHAGEAL ECHOCARDIOGRAM (TEE);  Surgeon: [name], MD;  Location: [facility] ORS;  Service: Cardiovascular;  Laterality: N/A;    Prior to Admission medications   Medication Sig Start Date End Date Taking? Authorizing Provider  amiodarone (PACERONE) 200 MG tablet Take 200 mg by mouth daily.    Yes [provider]  Cholecalciferol (VITAMIN D3) 1000 units CAPS Take 1,000 Units by mouth daily.    Yes [provider]  levothyroxine (SYNTHROID, LEVOTHROID) 50 MCG tablet Take 1 tablet (  50 mcg total) by mouth daily. 30 minutes prior to eating 05/07/18  Yes [name], MD  Probiotic Product (PROBIOTIC ADVANCED) CAPS Take 1 capsule by mouth.   Yes [provider]  rivaroxaban (XARELTO) 20 MG TABS tablet Take 20 mg by mouth daily.   Yes [provider]  ALPRAZolam (XANAX) 0.25 MG tablet Take 1 tablet (0.25 mg total)  by mouth 2 (two) times daily as needed for anxiety. Patient not taking: Reported on 08/15/2018 08/14/18   [name], MD  pantoprazole (PROTONIX) 40 MG tablet TAKE 1 TABLET BY MOUTH ONCE DAILY AS NEEDED Patient not taking: No sig reported 11/09/17   [name], MD  traMADol (ULTRAM) 50 MG tablet Take 1 tablet (50 mg total) by mouth every 8 (eight) hours as needed. Patient not taking: Reported on 08/15/2018 01/05/18   [name], MD    Allergies Patient has no known allergies.  Family History  Problem Relation Age of Onset  . Cancer Mother 7       breast cancer, lived to 75,   . Breast cancer Mother 35  . Cancer Son 62       pancreatic cancer  . Heart disease Son        CAD, Tobacco Abuse   . Heart disease Maternal Grandmother 24       died of massive MI  . Stroke Sister     Social History Social History   Tobacco Use  . Smoking status: Never Smoker  . Smokeless tobacco: Never Used  . Tobacco comment: 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 , worked at [employer], [employer] Use Topics  . Alcohol use: No  . Drug use: No    Review of Systems  Review of Systems  Constitutional: Positive for fatigue. Negative for fever.  HENT: Negative for congestion and sore throat.   Eyes: Negative for visual disturbance.  Respiratory: Positive for shortness of breath. Negative for cough.   Cardiovascular: Negative for chest pain.  Gastrointestinal: Negative for abdominal pain, diarrhea, nausea and vomiting.  Genitourinary: Negative for flank pain.  Musculoskeletal: Negative for back pain and neck pain.  Skin: Negative for rash and wound.  Neurological: Positive for weakness.  All other systems reviewed and are negative.    ____________________________________________  PHYSICAL EXAM:      VITAL SIGNS: ED Triage Vitals  Enc Vitals Group     BP 08/15/18 0954 (!) 146/69     Pulse Rate 08/15/18 0954 77     Resp 08/15/18 0954 18     Temp 08/15/18 0954 98.5 F (36.9 C)     Temp  Source 08/15/18 0954 Oral     SpO2 08/15/18 0954 95 %     Weight 08/15/18 0952 113 lb (51.3 kg)     Height 08/15/18 0952 5\' 3"  (1.6 m)     Head Circumference --      Peak Flow --      Pain Score 08/15/18 0952 0     Pain Loc --      Pain Edu? --      Excl. in [place]? --      Physical Exam    ____________________________________________   LABS (all labs ordered are listed, but only abnormal results are displayed)  Labs Reviewed  CBC WITH DIFFERENTIAL/PLATELET - Abnormal; Notable for the following components:      Result Value   RDW 15.9 (*)    Platelets 143 (*)    All other components within normal  limits  COMPREHENSIVE METABOLIC PANEL - Abnormal; Notable for the following components:   CO2 19 (*)    Glucose, Bld 104 (*)    Creatinine, Ser 1.13 (*)    Calcium 8.6 (*)    ALT 48 (*)    Total Bilirubin 3.0 (*)    GFR calc non Af Amer 44 (*)    GFR calc Af Amer 51 (*)    All other components within normal limits  BRAIN NATRIURETIC PEPTIDE - Abnormal; Notable for the following components:   B Natriuretic Peptide 1,341.0 (*)    All other components within normal limits  TROPONIN I (HIGH SENSITIVITY) - Abnormal; Notable for the following components:   Troponin I (High Sensitivity) 75 (*)    All other components within normal limits  TROPONIN I (HIGH SENSITIVITY) - Abnormal; Notable for the following components:   Troponin I (High Sensitivity) 107 (*)    All other components within normal limits  COMPREHENSIVE METABOLIC PANEL - Abnormal; Notable for the following components:   Glucose, Bld 111 (*)    Creatinine, Ser 1.11 (*)    Calcium 8.7 (*)    ALT 52 (*)    Total Bilirubin 2.8 (*)    GFR calc non Af Amer 45 (*)    GFR calc Af Amer 52 (*)    All other components within normal limits  SARS CORONAVIRUS 2 (HOSPITAL ORDER, [facility])    ____________________________________________  EKG: Normal sinus rhythm, ventricular rate 82.  T wave inversion  is now evident in anterior leads when compared to prior, but no overt ST elevations or depressions. ________________________________________  RADIOLOGY All imaging, including plain films, CT scans, and ultrasounds, independently reviewed by me, and interpretations confirmed via formal radiology reads.  ED MD interpretation:   Chest x-ray: Interstitial edema, consistent with CHF  Official radiology report(s): Dg Chest 2 View  Result Date: 08/15/2018 CLINICAL DATA:  Shortness of breath EXAM: CHEST - 2 VIEW COMPARISON:  04/04/2018 FINDINGS: Cardiomegaly. Cephalized blood flow and increased interstitial coarsening with a few Kerley lines. No air bronchogram, effusion, or pneumothorax. Exaggerated thoracic kyphosis. IMPRESSION: Cardiomegaly and mild pulmonary edema. Electronically Signed   By: Monte Fantasia M.D.   On: 08/15/2018 10:24    ____________________________________________  PROCEDURES   Procedure(s) performed (including Critical Care):  Procedures  ____________________________________________  INITIAL IMPRESSION / MDM / [place] / ED COURSE  As part of my medical decision making, I reviewed the following data within the electronic MEDICAL RECORD NUMBER Notes from prior ED visits and [place] Controlled Substance Database      *Frances Kent was evaluated in Emergency Department on 08/15/2018 for the symptoms described in the history of present illness. She was evaluated in the context of the global COVID-19 pandemic, which necessitated consideration that the patient might be at risk for infection with the SARS-CoV-2 virus that causes COVID-19. Institutional protocols and algorithms that pertain to the evaluation of patients at risk for COVID-19 are in a state of rapid change based on information released by regulatory bodies including the CDC and federal and state organizations. These policies and algorithms were followed during the patient's care in the ED.  Some ED evaluations  and interventions may be delayed as a result of limited staffing during the pandemic.*      Medical Decision Making: 85 year old female here with shortness of breath.  She appears hypervolemic clinically with bibasilar Rales.  Chest x-ray shows interstitial edema and BNP markedly elevated.  She sees Dr. [name], and have notified the cardiology group of arrival.  Will give Lasix, admit to medicine. Suspect acute on chronic CHF. She's been adherent with her anticoagulation, doubt PE.  ____________________________________________  FINAL CLINICAL IMPRESSION(S) / ED DIAGNOSES  Final diagnoses:  Acute on chronic systolic congestive heart failure (HCC)     MEDICATIONS GIVEN DURING THIS VISIT:  Medications  albuterol (PROVENTIL) (2.5 MG/3ML) 0.083% nebulizer solution 5 mg (5 mg Nebulization Given 08/15/18 1105)  furosemide (LASIX) injection 40 mg (40 mg Intravenous Given 08/15/18 1105)     ED Discharge Orders    None       Note:  This document was prepared using Dragon voice recognition software and may include unintentional dictation errors.   [name], MD 08/15/18 [phone]

## 2018-08-15 NOTE — ED Notes (Signed)
ED Provider at bedside. 

## 2018-08-15 NOTE — ED Notes (Signed)
ED TO INPATIENT HANDOFF REPORT  ED Nurse Name and Phone #: Janett Billow [phone]   S Name/Age/Gender [name] 85 y.o. female Room/Bed: ED26A/ED26A  Code Status   Code Status: Prior  Home/SNF/Other Home Patient oriented to: self, place, time and situation Is this baseline? Yes   Triage Complete: Triage complete  Chief Complaint sob weakness  Triage Note Presents with some SOB and weakness  For about 1 week  Also has had non productive cough      Allergies No Known Allergies  Level of Care/Admitting Diagnosis ED Disposition    ED Disposition Condition [facility] Area: [place] [100120]  Level of Care: Telemetry [5]  Covid Evaluation: Confirmed COVID Negative  Diagnosis: CHF (congestive heart failure) [facility]) [[id number]]  Admitting Physician: Henreitta Leber [[id number]]  Attending Physician: Henreitta Leber [[id number]]  Estimated length of stay: past midnight tomorrow  Certification:: I certify this patient will need inpatient services for at least 2 midnights  PT Class (Do Not Modify): Inpatient [101]  PT Acc Code (Do Not Modify): Private [1]       B Medical/Surgery History Past Medical History:  Diagnosis Date  . Atrial fibrillation ([place])   . GERD (gastroesophageal reflux disease)   . IPF (idiopathic pulmonary fibrosis) ([place]) 20111   by CT, PFTS done by Humphrey Rolls  . Mitral regurgitation   . Pulmonary embolism ([place]) 02/2016   Past Surgical History:  Procedure Laterality Date  . APPENDECTOMY  1960  . CARDIOVERSION N/A 04/05/2016   Procedure: Cardioversion;  Surgeon: [name], MD;  Location: [facility] ORS;  Service: Cardiovascular;  Laterality: N/A;  . CHOLECYSTECTOMY  1985  . OVARIAN CYST REMOVAL    . TEE WITHOUT CARDIOVERSION N/A 02/01/2018   Procedure: TRANSESOPHAGEAL ECHOCARDIOGRAM (TEE);  Surgeon: [name], MD;  Location: [facility] ORS;  Service: Cardiovascular;  Laterality: N/A;     A IV  Location/Drains/Wounds Patient Lines/Drains/Airways Status   Active Line/Drains/Airways    Name:   Placement date:   Placement time:   Site:   Days:   Peripheral IV 08/15/18 Left Antecubital   08/15/18    1038    Antecubital   less than 1          Intake/Output Last 24 hours  Intake/Output Summary (Last 24 hours) at 08/15/2018 1645 Last data filed at 08/15/2018 1627 Gross per 24 hour  Intake -  Output 1200 ml  Net -1200 ml    Labs/Imaging Results for orders placed or performed during the hospital encounter of 08/15/18 (from the past 48 hour(s))  CBC with Differential     Status: Abnormal   Collection Time: 08/15/18 10:37 AM  Result Value Ref Range   WBC 9.1 4.0 - 10.5 K/uL   RBC 4.10 3.87 - 5.11 MIL/uL   Hemoglobin 12.1 12.0 - 15.0 g/dL   HCT 38.7 36.0 - 46.0 %   MCV 94.4 80.0 - 100.0 fL   MCH 29.5 26.0 - 34.0 pg   MCHC 31.3 30.0 - 36.0 g/dL   RDW 15.9 (H) 11.5 - 15.5 %   Platelets 143 (L) 150 - 400 K/uL   nRBC 0.0 0.0 - 0.2 %   Neutrophils Relative % 82 %   Neutro Abs 7.4 1.7 - 7.7 K/uL   Lymphocytes Relative 9 %   Lymphs Abs 0.8 0.7 - 4.0 K/uL   Monocytes Relative 8 %   Monocytes Absolute 0.7 0.1 - 1.0 K/uL   Eosinophils Relative 0 %  Eosinophils Absolute 0.0 0.0 - 0.5 K/uL   Basophils Relative 1 %   Basophils Absolute 0.1 0.0 - 0.1 K/uL   Immature Granulocytes 0 %   Abs Immature Granulocytes 0.03 0.00 - 0.07 K/uL    Comment: Performed at [facility], [address] [place], [place] [address]  Comprehensive metabolic panel     Status: Abnormal   Collection Time: 08/15/18 10:37 AM  Result Value Ref Range   Sodium 140 135 - 145 mmol/L   Potassium 4.2 3.5 - 5.1 mmol/L    Comment: HEMOLYSIS AT THIS LEVEL MAY AFFECT RESULT   Chloride 108 98 - 111 mmol/L   CO2 19 (L) 22 - 32 mmol/L   Glucose, Bld 104 (H) 70 - 99 mg/dL   BUN 16 8 - 23 mg/dL   Creatinine, Ser 1.13 (H) 0.44 - 1.00 mg/dL   Calcium 8.6 (L) 8.9 - 10.3 mg/dL   Total Protein 6.7 6.5 - 8.1 g/dL    Albumin 3.6 3.5 - 5.0 g/dL   AST 41 15 - 41 U/L    Comment: HEMOLYSIS AT THIS LEVEL MAY AFFECT RESULT   ALT 48 (H) 0 - 44 U/L   Alkaline Phosphatase 88 38 - 126 U/L   Total Bilirubin 3.0 (H) 0.3 - 1.2 mg/dL    Comment: HEMOLYSIS AT THIS LEVEL MAY AFFECT RESULT   GFR calc non Af Amer 44 (L) >60 mL/min   GFR calc Af Amer 51 (L) >60 mL/min   Anion gap 13 5 - 15    Comment: Performed at [facility], [address] [place], [place] [address]  Brain natriuretic peptide     Status: Abnormal   Collection Time: 08/15/18 10:37 AM  Result Value Ref Range   B Natriuretic Peptide 1,341.0 (H) 0.0 - 100.0 pg/mL    Comment: Performed at [facility], [place], [place] [address]  Troponin I (High Sensitivity)     Status: Abnormal   Collection Time: 08/15/18 10:37 AM  Result Value Ref Range   Troponin I (High Sensitivity) 75 (H) <18 ng/L    Comment: (NOTE) Elevated high sensitivity troponin I (hsTnI) values and significant  changes across serial measurements may suggest ACS but many other  chronic and acute conditions are known to elevate hsTnI results.  Refer to the "Links" section for chest pain algorithms and additional  guidance. Performed at [facility], [address], [place], [place] [address]   SARS Coronavirus 2 (CEPHEID - Performed in [facility] hospital lab), Hosp Order     Status: None   Collection Time: 08/15/18 11:12 AM   Specimen: Nasopharyngeal Swab  Result Value Ref Range   SARS Coronavirus 2 NEGATIVE NEGATIVE    Comment: (NOTE) If result is NEGATIVE SARS-CoV-2 target nucleic acids are NOT DETECTED. The SARS-CoV-2 RNA is generally detectable in upper and lower  respiratory specimens during the acute phase of infection. The lowest  concentration of SARS-CoV-2 viral copies this assay can detect is 250  copies / mL. A negative result does not preclude SARS-CoV-2 infection  and should not be used as the sole basis for treatment  or other  patient management decisions.  A negative result may occur with  improper specimen collection / handling, submission of specimen other  than nasopharyngeal swab, presence of viral mutation(s) within the  areas targeted by this assay, and inadequate number of viral copies  (<250 copies / mL). A negative result must be combined with clinical  observations, patient history, and epidemiological information. If result is POSITIVE SARS-CoV-2 target nucleic acids are DETECTED. The SARS-CoV-2 RNA is generally detectable in upper and lower  respiratory specimens dur ing the acute phase of infection.  Positive  results are indicative of active infection with SARS-CoV-2.  Clinical  correlation with patient history and other diagnostic information is  necessary to determine patient infection status.  Positive results do  not rule out bacterial infection or co-infection with other viruses. If result is PRESUMPTIVE POSTIVE SARS-CoV-2 nucleic acids MAY BE PRESENT.   A presumptive positive result was obtained on the submitted specimen  and confirmed on repeat testing.  While 2019 novel coronavirus  (SARS-CoV-2) nucleic acids may be present in the submitted sample  additional confirmatory testing may be necessary for epidemiological  and / or clinical management purposes  to differentiate between  SARS-CoV-2 and other Sarbecovirus currently known to infect humans.  If clinically indicated additional testing with an alternate test  methodology [phone]) is advised. The SARS-CoV-2 RNA is generally  detectable in upper and lower respiratory sp ecimens during the acute  phase of infection. The expected result is Negative. Fact Sheet for Patients:  StrictlyIdeas.no Fact Sheet for Healthcare Providers: BankingDealers.co.za This test is not yet approved or cleared by the [place] FDA and has been authorized for detection and/or diagnosis of  SARS-CoV-2 by FDA under an Emergency Use Authorization (EUA).  This EUA will remain in effect (meaning this test can be used) for the duration of the COVID-19 declaration under Section 564(b)(1) of the Act, 21 U.S.C. section 360bbb-3(b)(1), unless the authorization is terminated or revoked sooner. Performed at [facility], [address] [place], [place] [address]   Comprehensive metabolic panel     Status: Abnormal   Collection Time: 08/15/18 12:21 PM  Result Value Ref Range   Sodium 142 135 - 145 mmol/L   Potassium 3.6 3.5 - 5.1 mmol/L   Chloride 107 98 - 111 mmol/L   CO2 23 22 - 32 mmol/L   Glucose, Bld 111 (H) 70 - 99 mg/dL   BUN 16 8 - 23 mg/dL   Creatinine, Ser 1.11 (H) 0.44 - 1.00 mg/dL   Calcium 8.7 (L) 8.9 - 10.3 mg/dL   Total Protein 6.7 6.5 - 8.1 g/dL   Albumin 3.7 3.5 - 5.0 g/dL   AST 40 15 - 41 U/L   ALT 52 (H) 0 - 44 U/L   Alkaline Phosphatase 93 38 - 126 U/L   Total Bilirubin 2.8 (H) 0.3 - 1.2 mg/dL   GFR calc non Af Amer 45 (L) >60 mL/min   GFR calc Af Amer 52 (L) >60 mL/min   Anion gap 12 5 - 15    Comment: Performed at [facility], [address], [place], [place] [address]  Troponin I (High Sensitivity)     Status: Abnormal   Collection Time: 08/15/18 12:23 PM  Result Value Ref Range   Troponin I (High Sensitivity) 107 (HH) <18 ng/L    Comment: CRITICAL RESULT CALLED TO, READ BACK BY AND VERIFIED WITH Danil Wedge RN AT 1308 ON 08/15/2018 SNG (NOTE) Elevated high sensitivity troponin I (hsTnI) values and significant  changes across serial measurements may suggest ACS but many other  chronic and acute conditions are known to elevate hsTnI results.  Refer to the "Links" section for chest pain algorithms and additional  guidance. Performed at [facility], [address] [place], [place] [address]    Dg Chest 2  View  Result Date: 08/15/2018 CLINICAL DATA:  Shortness of breath EXAM: CHEST - 2 VIEW COMPARISON:  04/04/2018  FINDINGS: Cardiomegaly. Cephalized blood flow and increased interstitial coarsening with a few Kerley lines. No air bronchogram, effusion, or pneumothorax. Exaggerated thoracic kyphosis. IMPRESSION: Cardiomegaly and mild pulmonary edema. Electronically Signed   By: Monte Fantasia M.D.   On: 08/15/2018 10:24    Pending Labs Unresulted Labs (From admission, onward)    Start     Ordered   08/15/18 1644  Troponin I (High Sensitivity)  Now then every 4 hours,   STAT    Question Answer Comment  Indication Other   Specify indication demand ischemia.      08/15/18 1644   Signed and Held  Basic metabolic panel  Daily,   R     Signed and Held          Vitals/Pain Today's Vitals   08/15/18 1530 08/15/18 1545 08/15/18 1600 08/15/18 1615  BP: 105/68  (!) 110/58   Pulse:  81 82 79  Resp:  (!) 25 (!) 26 (!) 25  Temp:      TempSrc:      SpO2:  99% 98% 98%  Weight:      Height:      PainSc:        Isolation Precautions No active isolations  Medications Medications  albuterol (PROVENTIL) (2.5 MG/3ML) 0.083% nebulizer solution 5 mg (5 mg Nebulization Given 08/15/18 1105)  furosemide (LASIX) injection 40 mg (40 mg Intravenous Given 08/15/18 1105)    Mobility walks Low fall risk   Focused Assessments 1   R Recommendations: See Admitting Provider Note  Report given to:   Additional Notes:

## 2018-08-15 NOTE — ED Notes (Addendum)
Pt 89% on RA, per MD 3L [place] at this time at 92%

## 2018-08-15 NOTE — ED Notes (Signed)
Walked pt to the restroom. Pt is back in bed but complained of being SOB.

## 2018-08-15 NOTE — Telephone Encounter (Signed)
Called and left message with pt's granddaughter that the medication was sent to pharmacy and she is to take 1/2 tab every 12 hours if needed.  Rick Carruthers,cma

## 2018-08-16 LAB — BASIC METABOLIC PANEL
Anion gap: 8 (ref 5–15)
BUN: 18 mg/dL (ref 8–23)
CO2: 27 mmol/L (ref 22–32)
Calcium: 8 mg/dL — ABNORMAL LOW (ref 8.9–10.3)
Chloride: 105 mmol/L (ref 98–111)
Creatinine, Ser: 1.16 mg/dL — ABNORMAL HIGH (ref 0.44–1.00)
GFR calc Af Amer: 50 mL/min — ABNORMAL LOW (ref >60)
GFR calc non Af Amer: 43 mL/min — ABNORMAL LOW (ref >60)
Glucose, Bld: 118 mg/dL — ABNORMAL HIGH (ref 70–99)
Potassium: 3.3 mmol/L — ABNORMAL LOW (ref 3.5–5.1)
Sodium: 140 mmol/L (ref 135–145)

## 2018-08-16 LAB — TROPONIN I (HIGH SENSITIVITY): Troponin I (High Sensitivity): 159 ng/L (ref <18)

## 2018-08-16 MED ORDER — RIVAROXABAN 15 MG PO TABS
15.0000 mg | ORAL_TABLET | Freq: Every day | ORAL | Status: DC
  Filled 2018-08-16: qty 1

## 2018-08-16 MED ORDER — POTASSIUM CHLORIDE CRYS ER 10 MEQ PO TBCR
10.0000 meq | EXTENDED_RELEASE_TABLET | Freq: Two times a day (BID) | ORAL | Status: DC
  Administered 2018-08-16 – 2018-08-17 (×2): 10 meq via ORAL
  Filled 2018-08-16 (×2): qty 1

## 2018-08-16 NOTE — Care Management Important Message (Signed)
Important Message  Patient Details  Name: Frances Kent MRN: [id number] Date of Birth: April 26, 1930   Medicare Important Message Given:  Yes  Initial Medicare IM given by Patient Access Associate on 08/15/2018 at 4:57pm. Still valid.   Dannette Barbara 08/16/2018, 2:36 PM

## 2018-08-16 NOTE — Progress Notes (Signed)
[facility] NAME: Idil Maslanka    MR#:  [id number]  DATE OF BIRTH:  Feb 19, 1930  SUBJECTIVE:  CHIEF COMPLAINT:   Chief Complaint  Patient presents with  . Shortness of Breath  . Weakness  . Cough    No new complaint this morning.  No fevers.  No chest pain.  Shortness of breath improving. REVIEW OF SYSTEMS:  Review of Systems  Constitutional: Negative for chills and fever.  HENT: Negative for hearing loss and tinnitus.   Eyes: Negative for blurred vision and double vision.  Respiratory: Negative for cough.        Shortness of breath improving  Cardiovascular: Negative for chest pain and palpitations.  Gastrointestinal: Negative for heartburn and nausea.  Genitourinary: Negative for dysuria and urgency.  Musculoskeletal: Negative for myalgias.  Skin: Negative for itching and rash.  Neurological: Negative for dizziness and headaches.  Psychiatric/Behavioral: Negative for depression and hallucinations.    DRUG ALLERGIES:  No Known Allergies VITALS:  Blood pressure 118/63, pulse 65, temperature 98 F (36.7 C), temperature source Oral, resp. rate 18, height 5\' 3"  (1.6 m), weight 50.5 kg, SpO2 97 %. PHYSICAL EXAMINATION:  Physical Exam  GENERAL:  85 y.o.-year-old patient lying in the bed in no acute distress.  EYES: Pupils equal, round, reactive to light and accommodation. No scleral icterus. Extraocular muscles intact.  HEENT: Head atraumatic, normocephalic. Oropharynx and nasopharynx clear. No oropharyngeal erythema, moist oral mucosa  NECK:  Supple, no jugular venous distention. No thyroid enlargement, no tenderness.  LUNGS: Normal breath sounds bilaterally, no wheezing, bibasilar rales, rhonchi. No use of accessory muscles of respiration.  CARDIOVASCULAR: S1, S2 RRR. No murmurs, rubs, gallops, clicks.  ABDOMEN: Soft, nontender, nondistended. Bowel sounds present. No organomegaly or mass.  EXTREMITIES: No pedal edema,  cyanosis, or clubbing. + 2 pedal & radial pulses b/l.   NEUROLOGIC: Cranial nerves II through XII are intact. No focal Motor or sensory deficits appreciated b/l PSYCHIATRIC: The patient is alert and oriented x 3.  SKIN: No obvious rash, lesion, or ulcer.  LABORATORY PANEL:  Female CBC Recent Labs  Lab 08/15/18 1037  WBC 9.1  HGB 12.1  HCT 38.7  PLT 143*   ------------------------------------------------------------------------------------------------------------------ Chemistries  Recent Labs  Lab 08/15/18 1221 08/16/18 0057  NA 142 140  K 3.6 3.3*  CL 107 105  CO2 23 27  GLUCOSE 111* 118*  BUN 16 18  CREATININE 1.11* 1.16*  CALCIUM 8.7* 8.0*  AST 40  --   ALT 52*  --   ALKPHOS 93  --   BILITOT 2.8*  --    RADIOLOGY:  No results found. ASSESSMENT AND PLAN:    85 y.o. female with a known history of mitral valve prolapse, atrial fibrillation, GERD, history of pulmonary embolism who presents to the hospital due to worsening shortness of breath.  1.  Acute on chronic diastolic CHF exacerbation. Patient presents to the hospital with worsening shortness of breath on exertion and even at rest and had chest x-ray findings suggestive of pulmonary edema and cardiomegaly. - His echo in December of last year showed normal EF of 50 to 55%.    Follow-up on repeat echo report when read. Continue diuresis with IV Lasix.  Continue lisinopril.  Plan to add beta-blocker prior to discharge from the hospital. Cardiology following.  2.  Elevated troponin- patient has mildly elevated high-sensitivity troponin.  This is likely secondary supply demand ischemia from CHF. Seen by cardiologist.  Appreciate input.  To consider Lexiscan stress test if left ventricular ejection fraction is significantly reduced compared to prior echo  3.  History of atrial fibrillation-rate controlled.  Continue amiodarone. -Continue Xarelto  4.  History of previous pulmonary embolism-continue Xarelto.  5.   GERD-continue Protonix.  6.  Hypothyroidism-continue Synthroid  DVT prophylaxis; patient on Xarelto  All the records are reviewed and case discussed with Care Management/Social Worker. Management plans discussed with the patient, family and they are in agreement.  CODE STATUS: Full Code  TOTAL TIME TAKING CARE OF THIS PATIENT: 38 minutes.   More than 50% of the time was spent in counseling/coordination of care: YES  POSSIBLE D/C IN 2 DAYS, DEPENDING ON CLINICAL CONDITION.   Cheridan Kibler M.D on 08/16/2018 at 1:17 PM  Between 7am to 6pm - Pager - [phone]  After 6pm go to [email or web address] - Proofreader  [facility]  Office  [phone]  CC: Primary care physician; [name], MD  Note: This dictation was prepared with Dragon dictation along with smaller phrase technology. Any transcriptional errors that result from this process are unintentional.

## 2018-08-16 NOTE — Progress Notes (Signed)
Pulmonary Rehab Navigator/ Exercise Physiologist Note  CHF Education:??  Educational session with patient completed.  Provided patient with "Living Better with Heart Failure" packet. Briefly reviewed definition of heart failure and signs and symptoms of an exacerbation. This EP discussed potential causes of CHF.  Explained to patient that HF is a chronic illness which requires self-assessment / self-management along with help from the cardiologist/PCP. This EP discussed definition of EF measurement along with normal value compared to patient's EF 50-55%.?  *Reviewed importance of and reason behind checking weight daily in the AM, after using the bathroom, but before getting dressed. Patient committed to begin weighing daily and recording her weight on a calendar.  *Reviewed with patient the following information:  *Discussed when to call the Dr= weight gain of >2-3lb overnight of 5lb in a week,  *Discussed yellow zone= call MD: weight gain of >2-3lb overnight of 5lb in a week, increased swelling, increased SOB when lying down, chest discomfort, dizziness, increased fatigue  *Red Zone= call 911: struggle to breath, fainting or near fainting, significant chest pain ?  *Heart Failure Zone Magnet given and reviewed with patient. ?  *Diet - Patient currently ordered heart healthy.  Referral for Dietitian Consultation for diet education has been ordered. Instructed patient to follow a low sodium diet of 2000 mg or less.  Recommended foods for low sodium heart healthy nutrition nutrition therapy discussed. Reviewed with patient steps to reading a food label with close attention to serving size and mg of sodium.   *Discussed fluid intake with patient as well. Patient not currently on a fluid restriction, but advised no more than 64 ounces of fluid per day. Demonstrated this volume to patient using the bedside water pitcher.   *Instructed patient to take medications as prescribed for heart failure.  Explained briefly why patient is on the medications and discussed monitoring and side effects.?  *Discussed exercise / activity. Patient reports being active, doing all of her housework, going up and down her stairs doing laundry, and taking care of her 70 month old grand baby. Patient reports doing less over the past couple of months because of feeling tired first and then feeling short of breath.  Encouraged patient to be as active as possible. Patient was given Pulmonary Rehab brochure and contact information. Patient was informed that the Department is currently closed due to COVID-19 but in the process of bringing back Cardiac Rehab patients and soon to be bringing back Pulmonary Rehab patients. Patient is interested in participating.?  *Smoking Cessation- Patient is a NEVER smoker. ?  *[facility] - Explained the purpose of the HF Clinic. Explained to patient the HF Clinic does not replace PCP nor Cardiologist, but is an additional resource to helping patient manage heart failure at home. Patient has an HF clinic appointment scheduled for 07/30 at 11am.  Again, the 5 Steps to Living Better with Heart Failure were reviewed with patient.  Patient thanked me for providing the above information. ?  Jasper Loser, [facility]  Exercise Physiologist Department Phone #: [phone] Fax: [phone]  Direct Line [phone] Email Address: [email or web address]

## 2018-08-16 NOTE — Consult Note (Signed)
[facility] Cardiology  CARDIOLOGY CONSULT NOTE  Patient ID: TYNASIA MCCAUL MRN: [id number] DOB/AGE: [birth year] 85 y.o.  Admit date: 08/15/2018 Referring Physician Verdell Carmine Primary Physician [facility] Primary Cardiologist [name] Reason for Consultation acute diastolic CHF  HPI: 85 year old female referred for evaluation of acute on chronic diastolic CHF.  The patient has a history of paroxysmal atrial fibrillation on Xarelto and amiodarone, idiopathic pulmonary fibrosis, mitral valve prolapse, and prior history of pulmonary embolism.  The patient was seen by Hilbert Odor, PA-C on 08/14/2018 for concerns of a several month history of generalized weakness off and on, more so in the recent days, as well as increased shortness of breath, described as inability to take a deep breath, and only shallow breathing.  Of note, her son passed away this week.  She denies experiencing palpitations, chest pain, peripheral edema, presyncope or syncope. The patient presented to [facility] ER yesterday for progressive worsening of her symptoms fatigue and shortness of breath. Admission labs notable for high-sensitivity troponin 107, 196, 162, followed by 159, potassium 3.3, creatinine 1.16, BUN 18, GFR 50, BNP 1341.  EKG revealed sinus rhythm at a rate of 82 bpm with incomplete right bundle branch block, left anterior fascicular block, and anterior and inferolateral T wave inversions without STEMI.  Chest x-ray revealed cardiomegaly and mild pulmonary edema. The patient had a TEE 02/01/2018 which revealed normal left ventricular function with LVEF 50 to 55% with moderate mitral regurgitation. The patient was not taking a diuretic at home. She currently reports feeling better with improvement of breathing.   Review of systems complete and found to be negative unless listed above     Past Medical History:  Diagnosis Date  . Atrial fibrillation ([place])   . GERD (gastroesophageal reflux disease)   . IPF (idiopathic pulmonary  fibrosis) ([place]) 20111   by CT, PFTS done by Humphrey Rolls  . Mitral regurgitation   . Pulmonary embolism ([place]) 02/2016    Past Surgical History:  Procedure Laterality Date  . APPENDECTOMY  1960  . CARDIOVERSION N/A 04/05/2016   Procedure: Cardioversion;  Surgeon: [name], MD;  Location: [facility] ORS;  Service: Cardiovascular;  Laterality: N/A;  . CHOLECYSTECTOMY  1985  . OVARIAN CYST REMOVAL    . TEE WITHOUT CARDIOVERSION N/A 02/01/2018   Procedure: TRANSESOPHAGEAL ECHOCARDIOGRAM (TEE);  Surgeon: [name], MD;  Location: [facility] ORS;  Service: Cardiovascular;  Laterality: N/A;    Medications Prior to Admission  Medication Sig Dispense Refill Last Dose  . amiodarone (PACERONE) 200 MG tablet Take 200 mg by mouth daily.    08/14/2018 at 0800  . Cholecalciferol (VITAMIN D3) 1000 units CAPS Take 1,000 Units by mouth daily.    Unknown at Unknown  . levothyroxine (SYNTHROID, LEVOTHROID) 50 MCG tablet Take 1 tablet (50 mcg total) by mouth daily. 30 minutes prior to eating 90 tablet 3 08/14/2018 at 0800  . Probiotic Product (PROBIOTIC ADVANCED) CAPS Take 1 capsule by mouth.   08/14/2018 at 0800  . rivaroxaban (XARELTO) 20 MG TABS tablet Take 20 mg by mouth daily.   08/14/2018 at 0800  . ALPRAZolam (XANAX) 0.25 MG tablet Take 1 tablet (0.25 mg total) by mouth 2 (two) times daily as needed for anxiety. (Patient not taking: Reported on 08/15/2018) 30 tablet 0 Not Taking at Unknown time  . pantoprazole (PROTONIX) 40 MG tablet TAKE 1 TABLET BY MOUTH ONCE DAILY AS NEEDED (Patient not taking: No sig reported) 90 tablet 0 Not Taking at Unknown time  . traMADol (ULTRAM) 50 MG  tablet Take 1 tablet (50 mg total) by mouth every 8 (eight) hours as needed. (Patient not taking: Reported on 08/15/2018) 30 tablet 0 Not Taking at Unknown time   Social History   Socioeconomic History  . Marital status: Widowed    Spouse name: Not on file  . Number of children: Not on file  . Years of education: Not on file  . Highest  education level: Not on file  Occupational History  . Not on file  Social Needs  . Financial resource strain: Not hard at all  . Food insecurity    Worry: Never true    Inability: Never true  . Transportation needs    Medical: No    Non-medical: No  Tobacco Use  . Smoking status: Never Smoker  . Smokeless tobacco: Never Used  . Tobacco comment: 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 , worked at [employer], [employer] and Sexual Activity  . Alcohol use: No  . Drug use: No  . Sexual activity: Not Currently  Lifestyle  . Physical activity    Days per week: Not on file    Minutes per session: Not on file  . Stress: Not at all  Relationships  . Social Herbalist on phone: Not on file    Gets together: Not on file    Attends religious service: Not on file    Active member of club or organization: Not on file    Attends meetings of clubs or organizations: Not on file    Relationship status: Not on file  . Intimate partner violence    Fear of current or ex partner: Not on file    Emotionally abused: Not on file    Physically abused: Not on file    Forced sexual activity: Not on file  Other Topics Concern  . Not on file  Social History Narrative   Has caretaking responsibility for 3 young grandchildren who live with her    Family History  Problem Relation Age of Onset  . Cancer Mother 84       breast cancer, lived to 62,   . Breast cancer Mother 74  . Cancer Son 21       pancreatic cancer  . Heart disease Son        CAD, Tobacco Abuse   . Heart disease Maternal Grandmother 74       died of massive MI  . Stroke Sister       Review of systems complete and found to be negative unless listed above      PHYSICAL EXAM  General: Frail elderly lady resting in bed, pleasant and smiling, in no acute distress HEENT:  Normocephalic and atramatic Neck:  No JVD.  Lungs: course bibasilar breath sounds without wheezing, normal effort of breathing on room air. Heart: HRRR .  Normal S1 and S2 without gallops or murmurs.  Abdomen: nondistended Msk:  Back normal, gait not assessed. Normal strength and tone for age. Extremities: No clubbing, cyanosis or edema.   Neuro: Alert and oriented X 3. Psych:  Good affect, responds appropriately  Labs:   Lab Results  Component Value Date   WBC 9.1 08/15/2018   HGB 12.1 08/15/2018   HCT 38.7 08/15/2018   MCV 94.4 08/15/2018   PLT 143 (L) 08/15/2018    Recent Labs  Lab 08/15/18 1221 08/16/18 0057  NA 142 140  K 3.6 3.3*  CL 107 105  CO2 23 27  BUN 16 18  CREATININE  1.11* 1.16*  CALCIUM 8.7* 8.0*  PROT 6.7  --   BILITOT 2.8*  --   ALKPHOS 93  --   ALT 52*  --   AST 40  --   GLUCOSE 111* 118*   Lab Results  Component Value Date   CKTOTAL 83 05/09/2018   TROPONINI <0.03 02/25/2018    Lab Results  Component Value Date   CHOL 264 (H) 07/24/2017   CHOL 256 (H) 07/13/2016   CHOL 266 (H) 04/06/2015   Lab Results  Component Value Date   HDL 73.60 07/24/2017   HDL 75.00 07/13/2016   HDL 81.60 04/06/2015   Lab Results  Component Value Date   LDLCALC 170 (H) 07/24/2017   LDLCALC 158 (H) 07/13/2016   LDLCALC 166 (H) 04/06/2015   Lab Results  Component Value Date   TRIG 103.0 07/24/2017   TRIG 118.0 07/13/2016   TRIG 95.0 04/06/2015   Lab Results  Component Value Date   CHOLHDL 4 07/24/2017   CHOLHDL 3 07/13/2016   CHOLHDL 3 04/06/2015   Lab Results  Component Value Date   LDLDIRECT 185.3 03/27/2013   LDLDIRECT 156.1 03/23/2012   LDLDIRECT 181.4 01/17/2011      Radiology: Dg Chest 2 View  Result Date: 08/15/2018 CLINICAL DATA:  Shortness of breath EXAM: CHEST - 2 VIEW COMPARISON:  04/04/2018 FINDINGS: Cardiomegaly. Cephalized blood flow and increased interstitial coarsening with a few Kerley lines. No air bronchogram, effusion, or pneumothorax. Exaggerated thoracic kyphosis. IMPRESSION: Cardiomegaly and mild pulmonary edema. Electronically Signed   By: Monte Fantasia M.D.   On: 08/15/2018  10:24    EKG: sinus rhythm  ASSESSMENT AND PLAN:  1.  Acute on chronic diastolic CHF, which presented as a several week history of progressive fatigue and shortness of breath, chest x-ray reveals pulmonary edema and cardiomegaly. BNP 1,341. Prior TEE in 01/2018 revealed normal left ventricular function. 2.  Borderline elevated high-sensitivity troponin of 107, 196, 162, followed by 159, without delta, likely due to demand supply ischemia in the setting of acute on chronic diastolic CHF. 3.  Paroxysmal atrial fibrillation, anticoagulated with Xarelto, on amiodarone, currently in sinus rhythm.   Recommendations: 1.  2D echocardiogram 2.  IV Lasix 20 mg twice daily with careful monitoring of renal status and electrolytes 3.  Monitor I&O's 4.  Recommend starting beta-blocker and PO furosemide by discharge 5.  Continue Xarelto for stroke prevention 6.  Continue amiodarone 200 mg at this time 7.  If LVEF significantly decreased from previous, consider Lexiscan Myoview for further evaluation. 8. Potassium supplementation with KCl 10 mEq BID though careful monitoring of BMP as patient was started on lisinopril 9. Further recommendations pending patient's initial course   Signed: Sharolyn Douglas 08/16/2018, 8:33 AM

## 2018-08-17 LAB — MAGNESIUM: Magnesium: 2.1 mg/dL (ref 1.7–2.4)

## 2018-08-17 LAB — BASIC METABOLIC PANEL
Anion gap: 7 (ref 5–15)
BUN: 21 mg/dL (ref 8–23)
CO2: 27 mmol/L (ref 22–32)
Calcium: 8.4 mg/dL — ABNORMAL LOW (ref 8.9–10.3)
Chloride: 107 mmol/L (ref 98–111)
Creatinine, Ser: 1.14 mg/dL — ABNORMAL HIGH (ref 0.44–1.00)
GFR calc Af Amer: 51 mL/min — ABNORMAL LOW (ref >60)
GFR calc non Af Amer: 44 mL/min — ABNORMAL LOW (ref >60)
Glucose, Bld: 95 mg/dL (ref 70–99)
Potassium: 3.6 mmol/L (ref 3.5–5.1)
Sodium: 141 mmol/L (ref 135–145)

## 2018-08-17 LAB — ECHOCARDIOGRAM COMPLETE
Height: 63 in
Weight: 1830.4 oz

## 2018-08-17 MED ORDER — RIVAROXABAN 15 MG PO TABS: 15.0000 mg | ORAL_TABLET | Freq: Every day | ORAL | 0 refills | Status: DC

## 2018-08-17 MED ORDER — FUROSEMIDE 20 MG PO TABS: 20.0000 mg | ORAL_TABLET | Freq: Two times a day (BID) | ORAL | 0 refills | Status: DC

## 2018-08-17 MED ORDER — LOSARTAN POTASSIUM 25 MG PO TABS: 25.0000 mg | ORAL_TABLET | Freq: Every day | ORAL | 0 refills | Status: DC

## 2018-08-17 MED ORDER — POTASSIUM CHLORIDE ER 10 MEQ PO TBCR: 10.0000 meq | EXTENDED_RELEASE_TABLET | Freq: Every day | ORAL | 0 refills | Status: DC

## 2018-08-17 MED ORDER — CARVEDILOL 3.125 MG PO TABS: 3.1250 mg | ORAL_TABLET | Freq: Two times a day (BID) | ORAL | 0 refills | Status: DC

## 2018-08-17 NOTE — Plan of Care (Signed)
  Problem: Clinical Measurements: Goal: Respiratory complications will improve Outcome: Progressing   Problem: Activity: Goal: Risk for activity intolerance will decrease Outcome: Progressing   Problem: Pain Managment: Goal: General experience of comfort will improve Outcome: Progressing   Problem: Safety: Goal: Ability to remain free from injury will improve Outcome: Progressing   Problem: Activity: Goal: Capacity to carry out activities will improve Outcome: Progressing

## 2018-08-17 NOTE — Progress Notes (Addendum)
[facility] Cardiology  SUBJECTIVE: Frances Kent is an 85 year old female with a past medical history significant for paroxysmal atrial fibrillation, on Xarelto and amiodarone, idiopathic pulmonary fibrosis, mitral valve prolapse, and a prior history of a pulmonary embolism who presented to the ED on 08/15/18 for worsening shortness of breath and fatigue.  Workup in the ED was significant for high-sensitivity troponin 107, 196, 162, followed by 159, potassium 3.3, creatinine 1.16, BUN 18, GFR 50, BNP 1341.  EKG revealed sinus rhythm at a rate of 82 bpm with incomplete right bundle branch block, left anterior fascicular block, and anterior and inferolateral T wave inversions without STEMI.  Today, Frances Kent reports significant improvement in shortness of breath.  She slept well and denied orthopnea or PND.  Denies current lower extremity swelling.  She also denies chest pain, palpitations, dizziness, lightheadedness, or syncopal/presyncopal episodes.    She is followed in outpatient cardiology by Dr. [name] and Hilbert Odor.  TEE on 02/01/18 revealed normal left ventricular function with LVEF 50 to 55% with moderate mitral regurgitation.  She is scheduled for a nuclear stress test in a couple of weeks.     Vitals:   08/16/18 1719 08/16/18 2019 08/17/18 0345 08/17/18 0752  BP: (!) 104/54 (!) 112/59 (!) 112/56 (!) 100/59  Pulse: 61 65 70 (!) 56  Resp: 18   18  Temp:  97.8 F (36.6 C) 98.3 F (36.8 C) 98.1 F (36.7 C)  TempSrc:  Oral Oral Oral  SpO2: 96% 98% 97% 98%  Weight:   51 kg   Height:         Intake/Output Summary (Last 24 hours) at 08/17/2018 0829 Last data filed at 08/17/2018 0350 Gross per 24 hour  Intake 480 ml  Output 1150 ml  Net -670 ml      PHYSICAL EXAM  General: Well developed, well nourished, in no acute distress HEENT:  Normocephalic and atramatic Neck:  No JVD.  Lungs: Clear bilaterally to auscultation and percussion. Heart: HRRR . Normal S1 and S2 without gallops  or murmurs.  Abdomen: Bowel sounds are positive, abdomen soft and non-tender  Msk:  Back normal.  Normal strength and tone for age. Extremities: No clubbing, cyanosis or edema.   Neuro: Alert and oriented X 3. Psych:  Good affect, responds appropriately   LABS: Basic Metabolic Panel: Recent Labs    08/16/18 0057 08/17/18 0544  NA 140 141  K 3.3* 3.6  CL 105 107  CO2 27 27  GLUCOSE 118* 95  BUN 18 21  CREATININE 1.16* 1.14*  CALCIUM 8.0* 8.4*  MG  --  2.1   Liver Function Tests: Recent Labs    08/15/18 1037 08/15/18 1221  AST 41 40  ALT 48* 52*  ALKPHOS 88 93  BILITOT 3.0* 2.8*  PROT 6.7 6.7  ALBUMIN 3.6 3.7   No results for input(s): LIPASE, AMYLASE in the last 72 hours. CBC: Recent Labs    08/15/18 1037  WBC 9.1  NEUTROABS 7.4  HGB 12.1  HCT 38.7  MCV 94.4  PLT 143*   Cardiac Enzymes: No results for input(s): CKTOTAL, CKMB, CKMBINDEX, TROPONINI in the last 72 hours. BNP: Invalid input(s): POCBNP D-Dimer: No results for input(s): DDIMER in the last 72 hours. Hemoglobin A1C: No results for input(s): HGBA1C in the last 72 hours. Fasting Lipid Panel: No results for input(s): CHOL, HDL, LDLCALC, TRIG, CHOLHDL, LDLDIRECT in the last 72 hours. Thyroid Function Tests: No results for input(s): TSH, T4TOTAL, T3FREE, THYROIDAB in the  last 72 hours.  Invalid input(s): FREET3 Anemia Panel: No results for input(s): VITAMINB12, FOLATE, FERRITIN, TIBC, IRON, RETICCTPCT in the last 72 hours.  Dg Chest 2 View  Result Date: 08/15/2018 CLINICAL DATA:  Shortness of breath EXAM: CHEST - 2 VIEW COMPARISON:  04/04/2018 FINDINGS: Cardiomegaly. Cephalized blood flow and increased interstitial coarsening with a few Kerley lines. No air bronchogram, effusion, or pneumothorax. Exaggerated thoracic kyphosis. IMPRESSION: Cardiomegaly and mild pulmonary edema. Electronically Signed   By: Monte Fantasia M.D.   On: 08/15/2018 10:24    Echo: Mildly reduced LV function with an EF  estimated between 40-45% with mild LVH, mildly dilated LV, moderate mitral valve stenosis and mild mitral valve insufficiency.   TELEMETRY: Normal sinus rhythm   ASSESSMENT AND PLAN:  Active Problems:   CHF (congestive heart failure) ([place])    1.  Acute on chronic diastolic congestive heart failure   -Shortness of breath has significantly improved; echocardiogram revealed mildly reduced LV function with moderate mitral valve stenosis - no significant change in mitral valve; EF slightly reduced from previous study   -Myoview scheduled at [facility] in 2 weeks; in the absence of chest pain and improvement in symptoms, no further workup from a cardiac standpoint recommended in an inpatient setting   -Will discharge on furosemide 20mg  with supplemental potassium, 12meq daily   -Discussed the importance of daily weights, low sodium diet  -Follow up at [facility] with Dr. [name] or Hilbert Odor within 1 week of discharge  2.  Atrial fibrillation   -Rate is currently well controlled; continue amiodarone 200mg  daily   -Continue Xarelto 15mg  daily  3.  Elevated troponin   -Likely in the setting of demand ischemia with acute on chronic CHF; nuclear stress test scheduled in an outpatient setting - okay to proceed with this   The history, physical exam findings, and plan of care were all discussed with Dr. Bartholome Bill, and all decision making was made in collaboration.   Avie Arenas PA-C 08/17/2018 8:29 AM  Agree with plan.

## 2018-08-17 NOTE — Care Management Important Message (Signed)
Important Message  Patient Details  Name: Frances Kent MRN: [id number] Date of Birth: 09/26/1930   Medicare Important Message Given:  Yes     Juliann Pulse A Oliwia Berzins 08/17/2018, 12:45 PM

## 2018-08-17 NOTE — Discharge Summary (Signed)
[facility] NAME: Frances Kent    MR#:  [id number]  DATE OF BIRTH:  Jan 18, 1931  DATE OF ADMISSION:  08/15/2018   ADMITTING PHYSICIAN: Henreitta Leber, MD  DATE OF DISCHARGE: 08/17/2018  PRIMARY CARE PHYSICIAN: [name], MD   ADMISSION DIAGNOSIS:  Acute on chronic systolic congestive heart failure ([place]) [I50.23] DISCHARGE DIAGNOSIS:  Active Problems:   CHF (congestive heart failure) ([place])  SECONDARY DIAGNOSIS:   Past Medical History:  Diagnosis Date   Atrial fibrillation (HCC)    GERD (gastroesophageal reflux disease)    IPF (idiopathic pulmonary fibrosis) ([place]) 20111   by CT, PFTS done by [facility]   Mitral regurgitation    Pulmonary embolism ([place]) 02/2016   HOSPITAL COURSE:  Chief complaints.  Shortness of breath and cough.   History of presenting complaint; Frances Kent  is a 85 y.o. female with a known history of mitral valve prolapse, atrial fibrillation, GERD, history of pulmonary embolism who presented to the hospital due to worsening shortness of breath.  Patient stated she had been feeling more short of breath and weak over the past week or so progressively getting worse. Patient underwent a chest x-ray which showed mild pulmonary edema with cardiomegaly.  She was clinically diagnosed with having decompensated congestive heart failure and therefore hospitalist services were contacted for admission. Patient's COVID-19 test is negative.   Hospital course; 1.  Acute on chronic diastolic CHF exacerbation. Patient presented to the hospital with worsening shortness of breath on exertion and even at rest and had chest x-ray findings suggestive of pulmonary edema and cardiomegaly.  Patient was adequately diuresed with IV Lasix with significant improvement clinically.  2D echocardiogram done during this admission with ejection fraction of 40 to 45%. Her echo in December of last year showed normal EF of 50 to 55%.     Patient seen by cardiologist  and cleared for discharge on p.o. Lasix.  Low-dose beta-blocker and losartan with hold parameters for blood pressures.  No plans for any further ischemic work-up at this time.  To follow-up with cardiologist for outpatient stress test.  2. Elevated troponin-patient has mildly elevated high-sensitivity troponin. This is likely secondary supply demand ischemia from CHF.Seen by cardiologist.  Appreciate input.    No plans for further cardiac work-up as inpatient.  For outpatient stress test with cardiologist.  Appointment made to follow-up with Dr. Marcell Barlow in 1 week.  3. History of atrial fibrillation-rate controlled. Continue amiodarone. -Continue Xarelto.  Dose decreased from 20 to 50 mg daily based on renal function during this admission.  4. History of previous pulmonary embolism-continue Xarelto.  5. GERD-continue Protonix.  6. Hypothyroidism-continue Synthroid  I called and updated patient's daughter on treatment and discharge plans for today.  All questions were answered.  DISCHARGE CONDITIONS:  Stable CONSULTS OBTAINED:  Treatment Team:  Isaias Cowman, MD DRUG ALLERGIES:  No Known Allergies DISCHARGE MEDICATIONS:   Allergies as of 08/17/2018   No Known Allergies     Medication List    STOP taking these medications   traMADol 50 MG tablet Commonly known as: ULTRAM     TAKE these medications   ALPRAZolam 0.25 MG tablet Commonly known as: XANAX Take 1 tablet (0.25 mg total) by mouth 2 (two) times daily as needed for anxiety.   amiodarone 200 MG tablet Commonly known as: PACERONE Take 200 mg by mouth daily.   carvedilol 3.125 MG tablet Commonly known as: Coreg Take 1 tablet (  3.125 mg total) by mouth 2 (two) times daily with a meal. Please hold for systolic blood pressure less than 110   furosemide 20 MG tablet Commonly known as: Lasix Take 1 tablet (20 mg total) by mouth 2 (two) times daily.   levothyroxine 50 MCG  tablet Commonly known as: SYNTHROID Take 1 tablet (50 mcg total) by mouth daily. 30 minutes prior to eating   losartan 25 MG tablet Commonly known as: Cozaar Take 1 tablet (25 mg total) by mouth daily. Hold for systolic blood pressure less than 110   pantoprazole 40 MG tablet Commonly known as: PROTONIX TAKE 1 TABLET BY MOUTH ONCE DAILY AS NEEDED   potassium chloride 10 MEQ tablet Commonly known as: K-DUR Take 1 tablet (10 mEq total) by mouth daily.   Probiotic Advanced Caps Take 1 capsule by mouth.   Rivaroxaban 15 MG Tabs tablet Commonly known as: XARELTO Take 1 tablet (15 mg total) by mouth daily. What changed:   medication strength  how much to take   Vitamin D3 25 MCG (1000 UT) Caps Take 1,000 Units by mouth daily.        DISCHARGE INSTRUCTIONS:   DIET:  Cardiac diet DISCHARGE CONDITION:  Stable ACTIVITY:  Activity as tolerated OXYGEN:  Home Oxygen: No.  Oxygen Delivery: room air DISCHARGE LOCATION:  home   If you experience worsening of your admission symptoms, develop shortness of breath, life threatening emergency, suicidal or homicidal thoughts you must seek medical attention immediately by calling 911 or calling your MD immediately  if symptoms less severe.  You Must read complete instructions/literature along with all the possible adverse reactions/side effects for all the Medicines you take and that have been prescribed to you. Take any new Medicines after you have completely understood and accpet all the possible adverse reactions/side effects.   Please note  You were cared for by a hospitalist during your hospital stay. If you have any questions about your discharge medications or the care you received while you were in the hospital after you are discharged, you can call the unit and asked to speak with the hospitalist on call if the hospitalist that took care of you is not available. Once you are discharged, your primary care physician will handle  any further medical issues. Please note that NO REFILLS for any discharge medications will be authorized once you are discharged, as it is imperative that you return to your primary care physician (or establish a relationship with a primary care physician if you do not have one) for your aftercare needs so that they can reassess your need for medications and monitor your lab values.    On the day of Discharge:  VITAL SIGNS:  Blood pressure (!) 100/59, pulse (!) 56, temperature 98.1 F (36.7 C), temperature source Oral, resp. rate 18, height 5\' 3"  (1.6 m), weight 51 kg, SpO2 98 %. PHYSICAL EXAMINATION:  GENERAL:  85 y.o.-year-old patient lying in the bed with no acute distress.  EYES: Pupils equal, round, reactive to light and accommodation. No scleral icterus. Extraocular muscles intact.  HEENT: Head atraumatic, normocephalic. Oropharynx and nasopharynx clear.  NECK:  Supple, no jugular venous distention. No thyroid enlargement, no tenderness.  LUNGS: Normal breath sounds bilaterally, no wheezing, rales,rhonchi or crepitation. No use of accessory muscles of respiration.  CARDIOVASCULAR: S1, S2 normal. No murmurs, rubs, or gallops.  ABDOMEN: Soft, non-tender, non-distended. Bowel sounds present. No organomegaly or mass.  EXTREMITIES: No pedal edema, cyanosis, or clubbing.  NEUROLOGIC: Cranial nerves  II through XII are intact. Muscle strength 5/5 in all extremities. Sensation intact. Gait not checked.  PSYCHIATRIC: The patient is alert and oriented x 3.  SKIN: No obvious rash, lesion, or ulcer.  DATA REVIEW:   CBC Recent Labs  Lab 08/15/18 1037  WBC 9.1  HGB 12.1  HCT 38.7  PLT 143*    Chemistries  Recent Labs  Lab 08/15/18 1221  08/17/18 0544  NA 142   < > 141  K 3.6   < > 3.6  CL 107   < > 107  CO2 23   < > 27  GLUCOSE 111*   < > 95  BUN 16   < > 21  CREATININE 1.11*   < > 1.14*  CALCIUM 8.7*   < > 8.4*  MG  --   --  2.1  AST 40  --   --   ALT 52*  --   --   ALKPHOS 93   --   --   BILITOT 2.8*  --   --    < > = values in this interval not displayed.     Microbiology Results  Results for orders placed or performed during the hospital encounter of 08/15/18  SARS Coronavirus 2 (CEPHEID - Performed in [place] hospital lab), Hosp Order     Status: None   Collection Time: 08/15/18 11:12 AM   Specimen: Nasopharyngeal Swab  Result Value Ref Range Status   SARS Coronavirus 2 NEGATIVE NEGATIVE Final    Comment: (NOTE) If result is NEGATIVE SARS-CoV-2 target nucleic acids are NOT DETECTED. The SARS-CoV-2 RNA is generally detectable in upper and lower  respiratory specimens during the acute phase of infection. The lowest  concentration of SARS-CoV-2 viral copies this assay can detect is 250  copies / mL. A negative result does not preclude SARS-CoV-2 infection  and should not be used as the sole basis for treatment or other  patient management decisions.  A negative result may occur with  improper specimen collection / handling, submission of specimen other  than nasopharyngeal swab, presence of viral mutation(s) within the  areas targeted by this assay, and inadequate number of viral copies  (<250 copies / mL). A negative result must be combined with clinical  observations, patient history, and epidemiological information. If result is POSITIVE SARS-CoV-2 target nucleic acids are DETECTED. The SARS-CoV-2 RNA is generally detectable in upper and lower  respiratory specimens dur ing the acute phase of infection.  Positive  results are indicative of active infection with SARS-CoV-2.  Clinical  correlation with patient history and other diagnostic information is  necessary to determine patient infection status.  Positive results do  not rule out bacterial infection or co-infection with other viruses. If result is PRESUMPTIVE POSTIVE SARS-CoV-2 nucleic acids MAY BE PRESENT.   A presumptive positive result was obtained on the submitted specimen  and  confirmed on repeat testing.  While 2019 novel coronavirus  (SARS-CoV-2) nucleic acids may be present in the submitted sample  additional confirmatory testing may be necessary for epidemiological  and / or clinical management purposes  to differentiate between  SARS-CoV-2 and other Sarbecovirus currently known to infect humans.  If clinically indicated additional testing with an alternate test  methodology [phone]) is advised. The SARS-CoV-2 RNA is generally  detectable in upper and lower respiratory sp ecimens during the acute  phase of infection. The expected result is Negative. Fact Sheet for Patients:  StrictlyIdeas.no Fact Sheet for Healthcare Providers: BankingDealers.co.za  This test is not yet approved or cleared by the [place] and has been authorized for detection and/or diagnosis of SARS-CoV-2 by FDA under an Emergency Use Authorization (EUA).  This EUA will remain in effect (meaning this test can be used) for the duration of the COVID-19 declaration under Section 564(b)(1) of the Act, 21 U.S.C. section 360bbb-3(b)(1), unless the authorization is terminated or revoked sooner. Performed at [facility], [address], [place], [place] [address]     RADIOLOGY:  No results found.   Management plans discussed with the patient, family and they are in agreement.  CODE STATUS: Full Code   TOTAL TIME TAKING CARE OF THIS PATIENT: 37 minutes.    Frances Kent M.D on 08/17/2018 at 11:31 AM  Between 7am to 6pm - Pager - [phone]  After 6pm go to [email or web address] - Proofreader  [facility]  Office  [phone]  CC: Primary care physician; [name], MD   Note: This dictation was prepared with Dragon dictation along with smaller phrase technology. Any transcriptional errors that result from this process are unintentional.

## 2018-08-20 ENCOUNTER — Other Ambulatory Visit: Payer: Self-pay

## 2018-08-20 ENCOUNTER — Telehealth: Payer: Self-pay | Admitting: Internal Medicine

## 2018-08-20 NOTE — Patient Outreach (Signed)
[facility]) Care Management  08/20/2018  Frances Kent 18-Aug-1930 [id number]   EMMI- General Discharge  RED ON EMMI ALERT Day # 1 Date: 08/19/2018 Red Alert Reason:  Questions about discharge papers? Yes  Scheduled follow-up? No    Outreach attempt: Spoke with patient. She is able to verify HIPAA.  Patient states she is doing ok just tired.  Addressed red alerts with patient. She states that she has no questions about her discharge papers. She states that she is taking her medications as prescribed and that she is to call her PCP today.  She states that she will be calling them and declines needing any assistance.  She also states that they suggested cardiac rehab for her if she wanted it. She states she thinks she does not want it and will let them know.  She also states that her cardiologist office will be calling her to set up a follow up. She states if they don't she will be calling them. Patient states she is weighing daily and that her weight today was 112.2 lbs. Discussed weight perimeters and when to notify physician.  She verbalized understanding.     Plan: RN CM will close case.   Jone Baseman, RN, MSN [facility] Care Management Care Management Coordinator Direct Line [phone] Toll Free: [phone]  Fax: [phone]

## 2018-08-20 NOTE — Telephone Encounter (Signed)
Transition Care Management Follow-up Telephone Call  How have you been since you were released from the hospital? Patient cannot get her energy level, denies chest pain, denies Shortness of breath . Patient says she gets real tired going up and down stairs.   Do you understand why you were in the hospital? yes   Do you understand the discharge instrcutions? yes  Items Reviewed:  Medications reviewed: yes  Allergies reviewed: yes  Dietary changes reviewed: yes  Referrals reviewed: yes   Functional Questionnaire:   Activities of Daily Living (ADLs):   She states they are independent in the following: ambulation, bathing and hygiene, feeding, continence, grooming, toileting and dressing States they require assistance with the following: No assistance needed for ADL's except patient has no energy.  Any transportation issues/concerns?: no   Any patient concerns? no   Confirmed importance and date/time of follow-up visits scheduled: yes   Confirmed with patient if condition begins to worsen call PCP or go to the ER.  Patient was given the Call-a-Nurse line [phone]: yes

## 2018-08-20 NOTE — Telephone Encounter (Signed)
HFU/Pt was discharged on 07/03 from [facility]/ Dx was CHF. Pt has been scheduled for HFU on 07/10 @ 11am.

## 2018-08-24 ENCOUNTER — Other Ambulatory Visit: Payer: Self-pay

## 2018-08-24 ENCOUNTER — Encounter: Payer: Self-pay | Admitting: Internal Medicine

## 2018-08-24 ENCOUNTER — Ambulatory Visit (INDEPENDENT_AMBULATORY_CARE_PROVIDER_SITE_OTHER): Payer: Medicare HMO | Admitting: Internal Medicine

## 2018-08-24 DIAGNOSIS — R7401 Elevation of levels of liver transaminase levels: Secondary | ICD-10-CM

## 2018-08-24 DIAGNOSIS — E034 Atrophy of thyroid (acquired): Secondary | ICD-10-CM | POA: Diagnosis not present

## 2018-08-24 DIAGNOSIS — G4700 Insomnia, unspecified: Secondary | ICD-10-CM | POA: Diagnosis not present

## 2018-08-24 DIAGNOSIS — I5032 Chronic diastolic (congestive) heart failure: Secondary | ICD-10-CM | POA: Diagnosis not present

## 2018-08-24 DIAGNOSIS — Z09 Encounter for follow-up examination after completed treatment for conditions other than malignant neoplasm: Secondary | ICD-10-CM

## 2018-08-24 DIAGNOSIS — R740 Nonspecific elevation of levels of transaminase and lactic acid dehydrogenase [LDH]: Secondary | ICD-10-CM

## 2018-08-24 DIAGNOSIS — E785 Hyperlipidemia, unspecified: Secondary | ICD-10-CM

## 2018-08-24 NOTE — Progress Notes (Signed)
Telephone Note  This visit type was conducted due to national recommendations for restrictions regarding the COVID-19 pandemic (e.g. social distancing).  This format is felt to be most appropriate for this patient at this time.  All issues noted in this document were discussed and addressed.  No physical exam was performed (except for noted visual exam findings with Video Visits).   I connected with@ on 08/24/18 at 11:00 AM EDT by a video enabled telemedicine application or telephone and verified that I am speaking with the correct person using two identifiers. Location patient: home Location provider: work or home office Persons participating in the virtual visit: patient, provider  I discussed the limitations, risks, security and privacy concerns of performing an evaluation and management service by telephone and the availability of in person appointments. I also discussed with the patient that there may be a patient responsible charge related to this service. The patient expressed understanding and agreed to proceed.   Reason for visit: hospital followup   HPI:  85 yr old female with history of idiopathic pulmonary fibrosis atrial fibrillation ([place]) ,   Mitral stenosis /prolapse and PE presented to ED on July 1 with acute on chronic respiratory failure secondary to decompensated congestive heart failure . COVID 19 test was negative .  She was diuresed adequately ro resume pulmonary edema.  EF was lower by repeat ECHO (40 to 45%) . Losartan and metoprolol were held at discharge due to hypotension.  Her dose of Xarelto was reduced to 15 mg daily to reflect decline in GFR   She was discharged home on July 3,   Has been suspending  carvedilol and losartan  bp meds for systolic < 235.She has been monitoring her BP and following a sliding scale and brings her recordings:  Saturday 100/55,  106/60,  118/63,  125/64,  And 138/66,  Yesterday 109/58  taking lasix twice daily but  "not urinating  much".  Daily weight 111 has been staying at  112   ROS: See pertinent positives and negatives per HPI.  Past Medical History:  Diagnosis Date  . Atrial fibrillation ([place])   . GERD (gastroesophageal reflux disease)   . IPF (idiopathic pulmonary fibrosis) ([place]) 20111   by CT, PFTS done by [facility]  . Mitral regurgitation   . Pulmonary embolism ([place]) 02/2016    Past Surgical History:  Procedure Laterality Date  . APPENDECTOMY  1960  . CARDIOVERSION N/A 04/05/2016   Procedure: Cardioversion;  Surgeon: [name], MD;  Location: [facility] ORS;  Service: Cardiovascular;  Laterality: N/A;  . CHOLECYSTECTOMY  1985  . OVARIAN CYST REMOVAL    . TEE WITHOUT CARDIOVERSION N/A 02/01/2018   Procedure: TRANSESOPHAGEAL ECHOCARDIOGRAM (TEE);  Surgeon: [name], MD;  Location: [facility] ORS;  Service: Cardiovascular;  Laterality: N/A;    Family History  Problem Relation Age of Onset  . Cancer Mother 60       breast cancer, lived to 68,   . Breast cancer Mother 53  . Cancer Son 65       pancreatic cancer  . Heart disease Son        CAD, Tobacco Abuse   . Heart disease Maternal Grandmother 36       died of massive MI  . Stroke Sister     SOCIAL HX:  reports that she has never smoked. She has never used smokeless tobacco. She reports that she does not drink alcohol or use drugs.   Current Outpatient Medications:  .  ALPRAZolam (XANAX) 0.25 MG tablet, Take 1 tablet (0.25 mg total) by mouth 2 (two) times daily as needed for anxiety., Disp: 30 tablet, Rfl: 0 .  amiodarone (PACERONE) 200 MG tablet, Take 200 mg by mouth daily. , Disp: , Rfl:  .  carvedilol (COREG) 3.125 MG tablet, Take 1 tablet (3.125 mg total) by mouth 2 (two) times daily with a meal. Please hold for systolic blood pressure less than 110, Disp: 60 tablet, Rfl: 0 .  Cholecalciferol (VITAMIN D3) 1000 units CAPS, Take 1,000 Units by mouth daily. , Disp: , Rfl:  .  furosemide (LASIX) 20 MG tablet, Take 1 tablet (20 mg total) by  mouth 2 (two) times daily., Disp: 60 tablet, Rfl: 0 .  levothyroxine (SYNTHROID, LEVOTHROID) 50 MCG tablet, Take 1 tablet (50 mcg total) by mouth daily. 30 minutes prior to eating, Disp: 90 tablet, Rfl: 3 .  losartan (COZAAR) 25 MG tablet, Take 1 tablet (25 mg total) by mouth daily. Hold for systolic blood pressure less than 110, Disp: 30 tablet, Rfl: 0 .  pantoprazole (PROTONIX) 40 MG tablet, TAKE 1 TABLET BY MOUTH ONCE DAILY AS NEEDED, Disp: 90 tablet, Rfl: 0 .  potassium chloride (K-DUR) 10 MEQ tablet, Take 1 tablet (10 mEq total) by mouth daily., Disp: 30 tablet, Rfl: 0 .  Probiotic Product (PROBIOTIC ADVANCED) CAPS, Take 1 capsule by mouth., Disp: , Rfl:  .  Rivaroxaban (XARELTO) 15 MG TABS tablet, Take 1 tablet (15 mg total) by mouth daily., Disp: 30 tablet, Rfl: 0  EXAM:  VITALS per patient if applicable:  GENERAL: alert, oriented, appears well and in no acute distress  HEENT: atraumatic, conjunttiva clear, no obvious abnormalities on inspection of external nose and ears  NECK: normal movements of the head and neck  LUNGS: on inspection no signs of respiratory distress, breathing rate appears normal, no obvious gross SOB, gasping or wheezing  CV: no obvious cyanosis  MS: moves all visible extremities without noticeable abnormality  PSYCH/NEURO: pleasant and cooperative, no obvious depression or anxiety, speech and thought processing grossly intact  ASSESSMENT AND PLAN:  Discussed the following assessment and plan:   Hospital discharge follow-up .Patient is stable post discharge and has no new issues or questions about discharge plans at the visit today for hospital follow up. All labs , imaging studies and progress notes from admission were reviewed with patient today    CHF (congestive heart failure) ([place]) Her systolic function has dropped compared to prior ECHO.  Advised patient to continue to monitor wt daily and double the lasix dose for overnight weight gain of 2 lbs or  weekly weight gain of 5 lbs.     I discussed the assessment and treatment plan with the patient. The patient was provided an opportunity to ask questions and all were answered. The patient agreed with the plan and demonstrated an understanding of the instructions.   The patient was advised to call back or seek an in-person evaluation if the symptoms worsen or if the condition fails to improve as anticipated.  I provided 22 minutes of non-face-to-face time during this encounter.   [name], MD

## 2018-08-26 NOTE — Assessment & Plan Note (Signed)
Her systolic function has dropped compared to prior ECHO.  Advised patient to continue to monitor wt daily and double the lasix dose for overnight weight gain of 2 lbs or weekly weight gain of 5 lbs.

## 2018-08-26 NOTE — Assessment & Plan Note (Signed)
Thyroid was very underactive in march and dose was change  Lab Results  Component Value Date   TSH 17.01 (H) 05/09/2018

## 2018-08-26 NOTE — Assessment & Plan Note (Signed)
Patient is stable post discharge and has no new issues or questions about discharge plans at the visit today for hospital follow up. All labs , imaging studies and progress notes from admission were reviewed with patient today   

## 2018-08-26 NOTE — Assessment & Plan Note (Signed)
symptoms suggest spinal stenosis. Plain films ordered and are still not available.  Tramadol and tylenol for pain control.

## 2018-08-28 DIAGNOSIS — I341 Nonrheumatic mitral (valve) prolapse: Secondary | ICD-10-CM | POA: Diagnosis not present

## 2018-08-28 DIAGNOSIS — R0602 Shortness of breath: Secondary | ICD-10-CM | POA: Diagnosis not present

## 2018-08-28 DIAGNOSIS — R5383 Other fatigue: Secondary | ICD-10-CM | POA: Diagnosis not present

## 2018-08-31 ENCOUNTER — Other Ambulatory Visit: Payer: Self-pay

## 2018-08-31 ENCOUNTER — Other Ambulatory Visit (INDEPENDENT_AMBULATORY_CARE_PROVIDER_SITE_OTHER): Payer: Medicare HMO

## 2018-08-31 DIAGNOSIS — E034 Atrophy of thyroid (acquired): Secondary | ICD-10-CM

## 2018-08-31 DIAGNOSIS — E785 Hyperlipidemia, unspecified: Secondary | ICD-10-CM

## 2018-08-31 DIAGNOSIS — R7401 Elevation of levels of liver transaminase levels: Secondary | ICD-10-CM

## 2018-08-31 DIAGNOSIS — R740 Nonspecific elevation of levels of transaminase and lactic acid dehydrogenase [LDH]: Secondary | ICD-10-CM

## 2018-08-31 LAB — COMPREHENSIVE METABOLIC PANEL
ALT: 63 U/L — ABNORMAL HIGH (ref 0–35)
AST: 37 U/L (ref 0–37)
Albumin: 4 g/dL (ref 3.5–5.2)
Alkaline Phosphatase: 88 U/L (ref 39–117)
BUN: 17 mg/dL (ref 6–23)
CO2: 27 mEq/L (ref 19–32)
Calcium: 9 mg/dL (ref 8.4–10.5)
Chloride: 106 mEq/L (ref 96–112)
Creatinine, Ser: 1.28 mg/dL — ABNORMAL HIGH (ref 0.40–1.20)
GFR: 39.54 mL/min — ABNORMAL LOW (ref >60.00)
Glucose, Bld: 88 mg/dL (ref 70–99)
Potassium: 4.6 mEq/L (ref 3.5–5.1)
Sodium: 140 mEq/L (ref 135–145)
Total Bilirubin: 0.9 mg/dL (ref 0.2–1.2)
Total Protein: 6.3 g/dL (ref 6.0–8.3)

## 2018-08-31 LAB — LIPID PANEL
Cholesterol: 208 mg/dL — ABNORMAL HIGH (ref 0–200)
HDL: 72.4 mg/dL (ref >39.00)
LDL Cholesterol: 116 mg/dL — ABNORMAL HIGH (ref 0–99)
NonHDL: 135.26
Total CHOL/HDL Ratio: 3
Triglycerides: 94 mg/dL (ref 0.0–149.0)
VLDL: 18.8 mg/dL (ref 0.0–40.0)

## 2018-08-31 LAB — TSH: TSH: 2.95 u[IU]/mL (ref 0.35–4.50)

## 2018-09-02 ENCOUNTER — Other Ambulatory Visit: Payer: Self-pay | Admitting: Internal Medicine

## 2018-09-02 DIAGNOSIS — R7401 Elevation of levels of liver transaminase levels: Secondary | ICD-10-CM

## 2018-09-03 ENCOUNTER — Telehealth: Payer: Self-pay

## 2018-09-03 NOTE — Telephone Encounter (Signed)
Yes she should resume daily lasix until her weight is back down to baseline.  Continue weighting daily and whenever there is a weight gain of 2 lbs overnight,  Take a dose of lasix

## 2018-09-03 NOTE — Telephone Encounter (Signed)
Pt stated that she was in the hospital 3 weeks ago with fluid around the heart and was prescribed lasix. The pt stated that she stopped taking it last week because she was not going to the bathroom like normal and her weight was staying the same. The pt's weight yesterday morning was 111.5lb and weight this morning is 114.5lb. Pt is wanting to know if she would be okay to go ahead and take a lasix this morning.

## 2018-09-03 NOTE — Telephone Encounter (Signed)
Spoke with pt and informed her of the message below. Pt gave a verbal understanding.  

## 2018-09-04 DIAGNOSIS — I10 Essential (primary) hypertension: Secondary | ICD-10-CM | POA: Diagnosis not present

## 2018-09-04 DIAGNOSIS — I5023 Acute on chronic systolic (congestive) heart failure: Secondary | ICD-10-CM | POA: Diagnosis not present

## 2018-09-04 DIAGNOSIS — I480 Paroxysmal atrial fibrillation: Secondary | ICD-10-CM | POA: Diagnosis not present

## 2018-09-04 DIAGNOSIS — I2699 Other pulmonary embolism without acute cor pulmonale: Secondary | ICD-10-CM | POA: Diagnosis not present

## 2018-09-04 DIAGNOSIS — I050 Rheumatic mitral stenosis: Secondary | ICD-10-CM | POA: Diagnosis not present

## 2018-09-07 ENCOUNTER — Other Ambulatory Visit: Payer: Self-pay

## 2018-09-10 ENCOUNTER — Ambulatory Visit (INDEPENDENT_AMBULATORY_CARE_PROVIDER_SITE_OTHER): Payer: Medicare HMO

## 2018-09-10 DIAGNOSIS — Z0000 Encounter for general adult medical examination without abnormal findings: Secondary | ICD-10-CM

## 2018-09-10 NOTE — Patient Instructions (Addendum)
  Frances Kent , Thank you for taking time to come for your Medicare Wellness Visit. I appreciate your ongoing commitment to your health goals. Please review the following plan we discussed and let me know if I can assist you in the future.   These are the goals we discussed: Goals    . Healthy Lifestyle     Stay hydrated and drink plenty of fluids. Low carb foods.  Lean meats and vegetables. Stay active and walk for exercise as tolerated.       This is a list of the screening recommended for you and due dates:  Health Maintenance  Topic Date Due  . Tetanus Vaccine  09/10/2019*  . Flu Shot  09/15/2018  . DEXA scan (bone density measurement)  Completed  . Pneumonia vaccines  Completed  *Topic was postponed. The date shown is not the original due date.

## 2018-09-10 NOTE — Progress Notes (Signed)
Subjective:   Frances Kent is a 85 y.o. female who presents for Medicare Annual (Subsequent) preventive examination.  Review of Systems:  No ROS.  Medicare Wellness Virtual Visit.  Visual/audio telehealth visit, UTA vital signs.   See social history for additional risk factors.   Cardiac Risk Factors include: advanced age (>73men, >28 women)     Objective:     Vitals: There were no vitals taken for this visit.  There is no height or weight on file to calculate BMI.  Advanced Directives 09/10/2018 08/15/2018 08/15/2018 02/25/2018 02/01/2018 06/15/2017 06/14/2016  Does Patient Have a Medical Advance Directive? Yes Yes No Yes No Yes Yes  Type of Paramedic of [place];Living will [place];Living will [place];Living will  Does patient want to make changes to medical advance directive? No - Patient declined No - Patient declined - - - No - Patient declined No - Patient declined  Copy of [place] in Chart? No - copy requested No - copy requested - - - No - copy requested No - copy requested  Would patient like information on creating a medical advance directive? - - No - Patient declined - No - Patient declined - -    Tobacco Social History   Tobacco Use  Smoking Status Never Smoker  Smokeless Tobacco Never Used  Tobacco Comment   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 , worked at [employer], [employer] given: Not Answered Comment: 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 , worked at [employer], [employer] Intake:  Pre-visit preparation completed: Yes        Diabetes: No  How often do you need to have someone help you when you read instructions, pamphlets, or other written materials from your doctor or pharmacy?: 1 - Never  Interpreter Needed?: No     Past Medical History:  Diagnosis Date  . Atrial fibrillation ([place])   . GERD (gastroesophageal reflux disease)   . IPF  (idiopathic pulmonary fibrosis) ([place]) 20111   by CT, PFTS done by Humphrey Rolls  . Mitral regurgitation   . Pulmonary embolism ([place]) 02/2016   Past Surgical History:  Procedure Laterality Date  . APPENDECTOMY  1960  . CARDIOVERSION N/A 04/05/2016   Procedure: Cardioversion;  Surgeon: [name], MD;  Location: [facility] ORS;  Service: Cardiovascular;  Laterality: N/A;  . CHOLECYSTECTOMY  1985  . OVARIAN CYST REMOVAL    . TEE WITHOUT CARDIOVERSION N/A 02/01/2018   Procedure: TRANSESOPHAGEAL ECHOCARDIOGRAM (TEE);  Surgeon: [name], MD;  Location: [facility] ORS;  Service: Cardiovascular;  Laterality: N/A;   Family History  Problem Relation Age of Onset  . Cancer Mother 19       breast cancer, lived to 18,   . Breast cancer Mother 21  . Cancer Son 28       pancreatic cancer  . Heart disease Son        CAD, Tobacco Abuse   . Heart disease Maternal Grandmother 58       died of massive MI  . Stroke Sister    Social History   Socioeconomic History  . Marital status: Widowed    Spouse name: Not on file  . Number of children: Not on file  . Years of education: Not on file  . Highest education level: Not on file  Occupational History  . Not on file  Social Needs  . Financial resource strain: Not  hard at all  . Food insecurity    Worry: Never true    Inability: Never true  . Transportation needs    Medical: No    Non-medical: No  Tobacco Use  . Smoking status: Never Smoker  . Smokeless tobacco: Never Used  . Tobacco comment: 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 , worked at [employer], [employer] and Sexual Activity  . Alcohol use: No  . Drug use: No  . Sexual activity: Not Currently  Lifestyle  . Physical activity    Days per week: Not on file    Minutes per session: Not on file  . Stress: Not at all  Relationships  . Social Herbalist on phone: Not on file    Gets together: Not on file    Attends religious service: Not on file    Active member of club or  organization: Not on file    Attends meetings of clubs or organizations: Not on file    Relationship status: Widowed  Other Topics Concern  . Not on file  Social History Narrative   Has caretaking responsibility for 3 young grandchildren who live with her    Outpatient Encounter Medications as of 09/10/2018  Medication Sig  . ALPRAZolam (XANAX) 0.25 MG tablet Take 1 tablet (0.25 mg total) by mouth 2 (two) times daily as needed for anxiety.  Marland Kitchen amiodarone (PACERONE) 200 MG tablet Take 200 mg by mouth daily.   . carvedilol (COREG) 3.125 MG tablet Take 1 tablet (3.125 mg total) by mouth 2 (two) times daily with a meal. Please hold for systolic blood pressure less than 110  . Cholecalciferol (VITAMIN D3) 1000 units CAPS Take 1,000 Units by mouth daily.   . furosemide (LASIX) 20 MG tablet Take 1 tablet (20 mg total) by mouth 2 (two) times daily.  Marland Kitchen levothyroxine (SYNTHROID, LEVOTHROID) 50 MCG tablet Take 1 tablet (50 mcg total) by mouth daily. 30 minutes prior to eating  . losartan (COZAAR) 25 MG tablet Take 1 tablet (25 mg total) by mouth daily. Hold for systolic blood pressure less than 110  . potassium chloride (K-DUR) 10 MEQ tablet Take 1 tablet (10 mEq total) by mouth daily.  . Probiotic Product (PROBIOTIC ADVANCED) CAPS Take 1 capsule by mouth.  . Rivaroxaban (XARELTO) 15 MG TABS tablet Take 1 tablet (15 mg total) by mouth daily.  . pantoprazole (PROTONIX) 40 MG tablet TAKE 1 TABLET BY MOUTH ONCE DAILY AS NEEDED (Patient not taking: Reported on 09/10/2018)   No facility-administered encounter medications on file as of 09/10/2018.     Activities of Daily Living In your present state of health, do you have any difficulty performing the following activities: 09/10/2018 08/15/2018  Hearing? N N  Vision? N N  Difficulty concentrating or making decisions? N N  Walking or climbing stairs? Y N  Comment Tires easily. -  Dressing or bathing? N N  Doing errands, shopping? N N  Comment She does not  drive often. Daughter accompanies. -  Preparing Food and eating ? N -  Using the Toilet? N -  In the past six months, have you accidently leaked urine? N -  Do you have problems with loss of bowel control? N -  Managing your Medications? N -  Managing your Finances? N -  Housekeeping or managing your Housekeeping? N -  Some recent data might be hidden    Patient Care Team: [name], MD as PCP - General (Internal Medicine) Robert Bellow,  MD (General Surgery) [name], MD (Internal Medicine)    Assessment:   This is a routine wellness examination for Rynn.  I connected with patient 09/10/18 at 12:00 PM EDT by an audio enabled telemedicine application and verified that I am speaking with the correct person using two identifiers. Patient stated full name and DOB. Patient gave permission to continue with virtual visit. Patient's location was at home and Nurse's location was at [place] office.   Weights every morning. 112 lb. She reports not gaining weight since discharge.   States she feels overall pretty good today.   Keep all routine maintenance appointments.   Health Screenings  Mammogram 3D screen- 08/2016. Ordered.  Colonoscopy - 10/2011 Bone Density - 02/2011 Glaucoma -none Hearing -demonstrates normal hearing during visit. Labs followed by pcp Cholesterol - 08/2018 Dental- partial; dentures Vision- visits within the last 12 months.  Social  Alcohol intake - no        Smoking history- never    Smokers in home? none Illicit drug use? none Exercise - no routine. Encouraged physical activity as tolerated with chair/standing exercises.  Diet - regular Sexually Active -not currently BMI- discussed the importance of a healthy diet, water intake and the benefits of aerobic exercise.  Educational material provided.   Safety  Patient feels safe at home- yes Patient does have smoke detectors at home- yes Patient does wear sunscreen or protective clothing when  in direct sunlight -yes Patient does wear seat belt when in a moving vehicle -yes Patient drives- yes; not as often  Covid-19 precautions and sickness symptoms discussed.   Activities of Daily Living Patient denies needing assistance with: driving, household chores, feeding themselves, getting from bed to chair, getting to the toilet, bathing/showering, dressing, managing money, or preparing meals.  She paces herself.   Depression Screen Patient denies losing interest in daily life, feeling hopeless, or crying easily over simple problems.   Medication-taking as directed and without issues.   Fall Screen Patient denies being afraid of falling or falling in the last year.   Memory Screen Patient is alert.  Correctly identified the president of the [place], season and recall.  Immunizations The following Immunizations were discussed: Influenza, shingles, pneumonia, and tetanus.   Other Providers Patient Care Team: [name], MD as PCP - General (Internal Medicine) Bary Castilla Forest Gleason, MD (General Surgery) [name], MD (Internal Medicine)  Exercise Activities and Dietary recommendations Current Exercise Habits: The patient does not participate in regular exercise at present  Goals    . Healthy Lifestyle     Stay hydrated and drink plenty of fluids. Low carb foods.  Lean meats and vegetables. Stay active and walk for exercise as tolerated.       Fall Risk Fall Risk  09/10/2018 06/15/2017 06/14/2016 11/23/2015 06/15/2015  Falls in the past year? 0 No No No No  Is the patient's home free of loose throw rugs in walkways, pet beds, electrical cords, etc? yes      Grab bars in the bathroom? yes      Handrails on the stairs? yes      Adequate lighting?  yes  Depression Screen PHQ 2/9 Scores 09/10/2018 06/15/2017 06/14/2016 04/08/2016  PHQ - 2 Score 0 0 0 0  PHQ- 9 Score - - 0 -     Cognitive Function MMSE - Mini Mental State Exam 06/14/2016 06/15/2015  Orientation to time 5 5   Orientation to Place 5 5  Registration 3 3  Attention/ Calculation 5 5  Recall 3 3  Language- name 2 objects 2 2  Language- repeat 1 1  Language- follow 3 step command 3 3  Language- read & follow direction 1 1  Write a sentence 1 1  Copy design 1 1  Total score 30 30     6CIT Screen 09/10/2018 06/15/2017  What Year? 0 points 0 points  What month? 0 points 0 points  What time? 0 points 0 points  Count back from 20 0 points 0 points  Months in reverse 0 points 0 points  Repeat phrase 0 points 0 points  Total Score 0 0    Immunization History  Administered Date(s) Administered  . Pneumococcal Conjugate-13 12/11/2014  . Pneumococcal Polysaccharide-23 07/13/2016   Screening Tests Health Maintenance  Topic Date Due  . TETANUS/TDAP  09/10/2019 (Originally 08/31/1949)  . INFLUENZA VACCINE  09/15/2018  . DEXA SCAN  Completed  . PNA vac Low Risk Adult  Completed      Plan:    End of life planning; Advance aging; Advanced directives discussed.  Copy of current HCPOA/Living Will requested.    I have personally reviewed and noted the following in the patient's chart:   . Medical and social history . Use of alcohol, tobacco or illicit drugs  . Current medications and supplements . Functional ability and status . Nutritional status . Physical activity . Advanced directives . List of other physicians . Hospitalizations, surgeries, and ER visits in previous 12 months . Vitals . Screenings to include cognitive, depression, and falls . Referrals and appointments  In addition, I have reviewed and discussed with patient certain preventive protocols, quality metrics, and best practice recommendations. A written personalized care plan for preventive services as well as general preventive health recommendations were provided to patient.     Varney Biles, LPN  9/[id number]/[id number]

## 2018-09-11 ENCOUNTER — Ambulatory Visit: Payer: Medicare HMO

## 2018-09-11 ENCOUNTER — Ambulatory Visit (INDEPENDENT_AMBULATORY_CARE_PROVIDER_SITE_OTHER): Payer: Medicare HMO | Admitting: Internal Medicine

## 2018-09-11 ENCOUNTER — Other Ambulatory Visit: Payer: Self-pay

## 2018-09-11 VITALS — BP 142/64 | HR 46 | Temp 98.3°F | Resp 15 | Ht 63.0 in | Wt 115.8 lb

## 2018-09-11 DIAGNOSIS — H353212 Exudative age-related macular degeneration, right eye, with inactive choroidal neovascularization: Secondary | ICD-10-CM | POA: Diagnosis not present

## 2018-09-11 DIAGNOSIS — I5032 Chronic diastolic (congestive) heart failure: Secondary | ICD-10-CM

## 2018-09-11 DIAGNOSIS — Z1231 Encounter for screening mammogram for malignant neoplasm of breast: Secondary | ICD-10-CM

## 2018-09-11 DIAGNOSIS — I10 Essential (primary) hypertension: Secondary | ICD-10-CM

## 2018-09-11 DIAGNOSIS — E034 Atrophy of thyroid (acquired): Secondary | ICD-10-CM | POA: Diagnosis not present

## 2018-09-11 DIAGNOSIS — R5383 Other fatigue: Secondary | ICD-10-CM

## 2018-09-11 DIAGNOSIS — G4700 Insomnia, unspecified: Secondary | ICD-10-CM | POA: Diagnosis not present

## 2018-09-11 DIAGNOSIS — F4321 Adjustment disorder with depressed mood: Secondary | ICD-10-CM

## 2018-09-11 DIAGNOSIS — D689 Coagulation defect, unspecified: Secondary | ICD-10-CM | POA: Diagnosis not present

## 2018-09-11 DIAGNOSIS — H353221 Exudative age-related macular degeneration, left eye, with active choroidal neovascularization: Secondary | ICD-10-CM | POA: Diagnosis not present

## 2018-09-11 DIAGNOSIS — Z0000 Encounter for general adult medical examination without abnormal findings: Secondary | ICD-10-CM

## 2018-09-11 DIAGNOSIS — I4891 Unspecified atrial fibrillation: Secondary | ICD-10-CM

## 2018-09-11 DIAGNOSIS — J84112 Idiopathic pulmonary fibrosis: Secondary | ICD-10-CM

## 2018-09-11 DIAGNOSIS — F4320 Adjustment disorder, unspecified: Secondary | ICD-10-CM

## 2018-09-11 DIAGNOSIS — E538 Deficiency of other specified B group vitamins: Secondary | ICD-10-CM | POA: Diagnosis not present

## 2018-09-11 LAB — COMPREHENSIVE METABOLIC PANEL
ALT: 29 U/L (ref 0–35)
AST: 19 U/L (ref 0–37)
Albumin: 4 g/dL (ref 3.5–5.2)
Alkaline Phosphatase: 83 U/L (ref 39–117)
BUN: 13 mg/dL (ref 6–23)
CO2: 31 mEq/L (ref 19–32)
Calcium: 9.1 mg/dL (ref 8.4–10.5)
Chloride: 104 mEq/L (ref 96–112)
Creatinine, Ser: 1.2 mg/dL (ref 0.40–1.20)
GFR: 42.59 mL/min — ABNORMAL LOW (ref >60.00)
Glucose, Bld: 82 mg/dL (ref 70–99)
Potassium: 4.6 mEq/L (ref 3.5–5.1)
Sodium: 141 mEq/L (ref 135–145)
Total Bilirubin: 0.6 mg/dL (ref 0.2–1.2)
Total Protein: 6.3 g/dL (ref 6.0–8.3)

## 2018-09-11 LAB — CBC WITH DIFFERENTIAL/PLATELET
Basophils Absolute: 0.3 10*3/uL — ABNORMAL HIGH (ref 0.0–0.1)
Basophils Relative: 5.2 % — ABNORMAL HIGH (ref 0.0–3.0)
Eosinophils Absolute: 0.1 10*3/uL (ref 0.0–0.7)
Eosinophils Relative: 1.5 % (ref 0.0–5.0)
HCT: 37.2 % (ref 36.0–46.0)
Hemoglobin: 11.9 g/dL — ABNORMAL LOW (ref 12.0–15.0)
Lymphocytes Relative: 23.9 % (ref 12.0–46.0)
Lymphs Abs: 1.2 10*3/uL (ref 0.7–4.0)
MCHC: 32 g/dL (ref 30.0–36.0)
MCV: 93.9 fl (ref 78.0–100.0)
Monocytes Absolute: 0.5 10*3/uL (ref 0.1–1.0)
Monocytes Relative: 10 % (ref 3.0–12.0)
Neutro Abs: 2.9 10*3/uL (ref 1.4–7.7)
Neutrophils Relative %: 59.4 % (ref 43.0–77.0)
Platelets: 192 10*3/uL (ref 150.0–400.0)
RBC: 3.96 Mil/uL (ref 3.87–5.11)
RDW: 15.9 % — ABNORMAL HIGH (ref 11.5–15.5)
WBC: 4.9 10*3/uL (ref 4.0–10.5)

## 2018-09-11 LAB — B12 AND FOLATE PANEL
Folate: 11.4 ng/mL (ref >5.9)
Vitamin B-12: 253 pg/mL (ref 211–911)

## 2018-09-11 MED ORDER — MIRTAZAPINE 15 MG PO TABS: 7.5000 mg | ORAL_TABLET | Freq: Every day | ORAL | 2 refills | Status: DC

## 2018-09-11 NOTE — Progress Notes (Signed)
Patient ID: Frances Kent, female    DOB: [birth year]  Age: 85 y.o. MRN: [id number]  The patient is here for annual preventive examination and management of other chronic and acute problems.   The risk factors are reflected in the social history.  The roster of all physicians providing medical care to patient - is listed in the Snapshot section of the chart.  Activities of daily living:  The patient complains of fatigue that is chronic, and present occasionally upon waking  Other times after 1-2 hours. . She  is 100% independent in all ADLs: dressing, toileting, feeding as well as independent mobility  Home safety : The patient has smoke detectors in the home. They wear seatbelts.  There are no firearms at home. There is no violence in the home.   There is no risks for hepatitis, STDs or HIV. There is no   history of blood transfusion. They have no travel history to infectious disease endemic areas of the world.  The patient has seen their dentist in the last six month. They have seen their eye doctor in the last year. They admit to slight hearing difficulty with regard to whispered voices and some television programs.  They have deferred audiologic testing in the last year.  They do not  have excessive sun exposure. Discussed the need for sun protection: hats, long sleeves and use of sunscreen if there is significant sun exposure.   Diet: the importance of a healthy diet is discussed. They do have a healthy diet.  The benefits of regular aerobic exercise were discussed. She walks 4 times per week ,  20 minutes.   Depression screen: there are no signs or vegative symptoms of depression- irritability, change in appetite, anhedonia, sadness/tearfullness.  Cognitive assessment: the patient manages all their financial and personal affairs and is actively engaged. They could relate day,date,year and events; recalled 2/3 objects at 3 minutes; performed clock-face test normally.  The following  portions of the patient's history were reviewed and updated as appropriate: allergies, current medications, past family history, past medical history,  past surgical history, past social history  and problem list.  Visual acuity was not assessed per patient preference since she has regular follow up with her ophthalmologist. Hearing and body mass index were assessed and reviewed.   During the course of the visit the patient was educated and counseled about appropriate screening and preventive services including : fall prevention , diabetes screening, nutrition counseling, colorectal cancer screening, and recommended immunizations.    CC: The primary encounter diagnosis was Encounter for screening mammogram for breast cancer. Diagnoses of Fatigue, unspecified type, B12 deficiency, Atrial fibrillation status post cardioversion (HCC), IPF (idiopathic pulmonary fibrosis) ([place]), Coagulopathy ([place]), Grief reaction, Routine adult health maintenance, Hypothyroidism due to acquired atrophy of thyroid, Insomnia, unspecified type, Chronic diastolic congestive heart failure ([place]), and Essential hypertension were also pertinent to this visit.  Admitted to [facility] on July 1 for acute on chronic systolic CHF with one week h/o progressive dyspnea. She was noted to have pulmonary edema .  And was  admitted for 2 day stay.  Saw kowalski last week did not change meds from discharge.  ECHO and stress test reviewed at his visit with her . EF had declined slightly to 45% .    Not taking the lasix bid as directed,  but daily weights have been stable. Following chf protocol also prescribed carvedilol and losartan for management of hypertension . She has felt fatigued since home and has  noted a slow pulse  In the 40's with home Bp 158/64.   Pulse is in the 30's and 40s and she has felt lightheaded  On occasion when walking  Frequently  waking   3 to 4 times per night   Weight is stable but appetite  Poor. Bowels moving  normally.  [name] has a past medical history of Atrial fibrillation ([place]), GERD (gastroesophageal reflux disease), IPF (idiopathic pulmonary fibrosis) ([place]) (20111), Mitral regurgitation, and Pulmonary embolism ([place]) (02/2016).   She has a past surgical history that includes Appendectomy (1960); Cholecystectomy (1985); Ovarian cyst removal; CARDIOVERSION (N/A, 04/05/2016); and TEE without cardioversion (N/A, 02/01/2018).   Her family history includes Breast cancer (age of onset: 11) in her mother; Cancer (age of onset: 43) in her son; Cancer (age of onset: 70) in her mother; Heart disease in her son; Heart disease (age of onset: 11) in her maternal grandmother; Stroke in her sister.She reports that she has never smoked. She has never used smokeless tobacco. She reports that she does not drink alcohol or use drugs.  Outpatient Medications Prior to Visit  Medication Sig Dispense Refill  . ALPRAZolam (XANAX) 0.25 MG tablet Take 1 tablet (0.25 mg total) by mouth 2 (two) times daily as needed for anxiety. 30 tablet 0  . amiodarone (PACERONE) 200 MG tablet Take 200 mg by mouth daily.     . carvedilol (COREG) 3.125 MG tablet Take 1 tablet (3.125 mg total) by mouth 2 (two) times daily with a meal. Please hold for systolic blood pressure less than 110 60 tablet 0  . Cholecalciferol (VITAMIN D3) 1000 units CAPS Take 1,000 Units by mouth daily.     . furosemide (LASIX) 20 MG tablet Take 1 tablet (20 mg total) by mouth 2 (two) times daily. 60 tablet 0  . levothyroxine (SYNTHROID, LEVOTHROID) 50 MCG tablet Take 1 tablet (50 mcg total) by mouth daily. 30 minutes prior to eating 90 tablet 3  . losartan (COZAAR) 25 MG tablet Take 1 tablet (25 mg total) by mouth daily. Hold for systolic blood pressure less than 110 30 tablet 0  . pantoprazole (PROTONIX) 40 MG tablet TAKE 1 TABLET BY MOUTH ONCE DAILY AS NEEDED 90 tablet 0  . potassium chloride (K-DUR) 10 MEQ tablet Take 1 tablet (10 mEq total) by mouth daily.  30 tablet 0  . Probiotic Product (PROBIOTIC ADVANCED) CAPS Take 1 capsule by mouth.    . Rivaroxaban (XARELTO) 15 MG TABS tablet Take 1 tablet (15 mg total) by mouth daily. 30 tablet 0  . bevacizumab (AVASTIN) 400 MG/16ML SOLN Inject into the vein.     No facility-administered medications prior to visit.     Review of Systems  Patient denies headache, fevers, malaise, unintentional weight loss, skin rash, eye pain, sinus congestion and sinus pain, sore throat, dysphagia,  hemoptysis , cough, dyspnea, wheezing, chest pain, palpitations, orthopnea, edema, abdominal pain, nausea, melena, diarrhea, constipation, flank pain, dysuria, hematuria, urinary  Frequency, nocturia, numbness, tingling, seizures,  Focal weakness, Loss of consciousness,  Tremor, insomnia, depression, anxiety, and suicidal ideation.      Objective:  BP (!) 142/64 (BP Location: Left Arm, Patient Position: Sitting, Cuff Size: Normal)   Pulse (!) 46   Temp 98.3 F (36.8 C) (Oral)   Resp 15   Ht 5\' 3"  (1.6 m)   Wt 115 lb 12.8 oz (52.5 kg)   SpO2 97%   BMI 20.51 kg/m   Physical Exam  General appearance: alert, cooperative and appears  stated age Head: Normocephalic, without obvious abnormality, atraumatic Eyes: conjunctivae/corneas clear. PERRL, EOM's intact. Fundi benign. Ears: normal TM's and external ear canals both ears Nose: Nares normal. Septum midline. Mucosa normal. No drainage or sinus tenderness. Throat: lips, mucosa, and tongue normal; teeth and gums normal Neck: no adenopathy, no carotid bruit, no JVD, supple, symmetrical, trachea midline and thyroid not enlarged, symmetric, no tenderness/mass/nodules Lungs: clear to auscultation bilaterally Breasts: normal appearance, no masses or tenderness Heart: regular rate and rhythm, S1, S2 normal, no murmur, click, rub or gallop Abdomen: soft, non-tender; bowel sounds normal; no masses,  no organomegaly Extremities: extremities normal, atraumatic, no cyanosis or  edema Pulses: 2+ and symmetric Skin: Skin color, texture, turgor normal. No rashes or lesions Neurologic: Alert and oriented X 3, normal strength and tone. Normal symmetric reflexes. Normal coordination and gait.    Assessment & Plan:   Problem List Items Addressed This Visit      Unprioritized   Routine adult health maintenance    age appropriate education and counseling updated, referrals for preventative services and immunizations addressed, dietary and smoking counseling addressed, most recent labs reviewed.  I have personally reviewed and have noted:  1) the patient's medical and social history 2) The pt's use of alcohol, tobacco, and illicit drugs 3) The patient's current medications and supplements 4) Functional ability including ADL's, fall risk, home safety risk, hearing and visual impairment 5) Diet and physical activities 6) Evidence for depression or mood disorder 7) The patient's height, weight, and BMI have been recorded in the chart  I have made referrals, and provided counseling and education based on review of the above      Insomnia    remeron trial       Hypothyroidism due to acquired atrophy of thyroid    Thyroid was very underactive in march and dose was increased,  Repeat TSH is normal  Lab Results  Component Value Date   TSH 2.95 08/31/2018        Grief reaction    Secondary to loss of son in law.  Decreased energy and appetite ,  And insomnia,  Trial of remeron       Essential hypertension    Continue losartan,  Advised to reduce carvedilol given bradycardia and fatigue.  . May need to increase losartan dose to 50 mg       Coagulopathy (HCC)   CHF (congestive heart failure) ([place])    Her systolic function has decreased from 55% to 45%  By repeat ECHO in July . She has been advised to continue daily weight checks to monitor for weight gain.       B12 deficiency   Relevant Orders   B12 and Folate Panel (Completed)   Intrinsic Factor  Antibodies   Fatigue   Relevant Orders   CBC with Differential/Platelet (Completed)   Comprehensive metabolic panel (Completed)   Atrial fibrillation status post cardioversion (HCC)   IPF (idiopathic pulmonary fibrosis) ([place])    Other Visit Diagnoses    Encounter for screening mammogram for breast cancer    -  Primary   Relevant Orders   B12 and Folate Panel (Completed)   MM 3D SCREEN BREAST BILATERAL      I am having Blanch Media A. Schlack start on mirtazapine. I am also having her maintain her Vitamin D3, Probiotic Advanced, pantoprazole, amiodarone, levothyroxine, ALPRAZolam, furosemide, potassium chloride, carvedilol, losartan, Rivaroxaban, and bevacizumab.  Meds ordered this encounter  Medications  . mirtazapine (REMERON) 15 MG tablet  Sig: Take 0.5 tablets (7.5 mg total) by mouth at bedtime.    Dispense:  30 tablet    Refill:  2    A total of 40 minutes was spent with patient more than half of which was spent in counseling patient on the above mentioned issues , reviewing and explaining recent labs and imaging studies done, and coordination of care.   There are no discontinued medications.  Follow-up: No follow-ups on file.   [name], MD

## 2018-09-11 NOTE — Patient Instructions (Addendum)
We are repeating your liver tests today rather than next week  Your fatigue may be from your medication:  Reduce your carvedilol to 1/2 tablet in the morning and continue a  full tablet  In the evening  If it can't be cut in half,  Omit the morning dose completely.  Continue the evening dose   Check your BP and pulse once daily in the right arm in mid morning and send me those readings  in one week   I am starting you on a medication called remeron 7.5 mg (1/2 tablet) daily 30 minutes before bedtime to help you rest and improve your appetite     Health Maintenance for Postmenopausal Women Menopause is a normal process in which your ability to get pregnant comes to an end. This process happens slowly over many months or years, usually between the ages of 57 and 31. Menopause is complete when you have missed your menstrual periods for 12 months. It is important to talk with your health care provider about some of the most common conditions that affect women after menopause (postmenopausal women). These include heart disease, cancer, and bone loss (osteoporosis). Adopting a healthy lifestyle and getting preventive care can help to promote your health and wellness. The actions you take can also lower your chances of developing some of these common conditions. What should I know about menopause? During menopause, you may get a number of symptoms, such as:  Hot flashes. These can be moderate or severe.  Night sweats.  Decrease in sex drive.  Mood swings.  Headaches.  Tiredness.  Irritability.  Memory problems.  Insomnia. Choosing to treat or not to treat these symptoms is a decision that you make with your health care provider. Do I need hormone replacement therapy?  Hormone replacement therapy is effective in treating symptoms that are caused by menopause, such as hot flashes and night sweats.  Hormone replacement carries certain risks, especially as you become older. If you are  thinking about using estrogen or estrogen with progestin, discuss the benefits and risks with your health care provider. What is my risk for heart disease and stroke? The risk of heart disease, heart attack, and stroke increases as you age. One of the causes may be a change in the body's hormones during menopause. This can affect how your body uses dietary fats, triglycerides, and cholesterol. Heart attack and stroke are medical emergencies. There are many things that you can do to help prevent heart disease and stroke. Watch your blood pressure  High blood pressure causes heart disease and increases the risk of stroke. This is more likely to develop in people who have high blood pressure readings, are of African descent, or are overweight.  Have your blood pressure checked: ? Every 3-5 years if you are 77-18 years of age. ? Every year if you are 45 years old or older. Eat a healthy diet   Eat a diet that includes plenty of vegetables, fruits, low-fat dairy products, and lean protein.  Do not eat a lot of foods that are high in solid fats, added sugars, or sodium. Get regular exercise Get regular exercise. This is one of the most important things you can do for your health. Most adults should:  Try to exercise for at least 150 minutes each week. The exercise should increase your heart rate and make you sweat (moderate-intensity exercise).  Try to do strengthening exercises at least twice each week. Do these in addition to the moderate-intensity exercise.  Spend less time sitting. Even light physical activity can be beneficial. Other tips  Work with your health care provider to achieve or maintain a healthy weight.  Do not use any products that contain nicotine or tobacco, such as cigarettes, e-cigarettes, and chewing tobacco. If you need help quitting, ask your health care provider.  Know your numbers. Ask your health care provider to check your cholesterol and your blood sugar  (glucose). Continue to have your blood tested as directed by your health care provider. Do I need screening for cancer? Depending on your health history and family history, you may need to have cancer screening at different stages of your life. This may include screening for:  Breast cancer.  Cervical cancer.  Lung cancer.  Colorectal cancer. What is my risk for osteoporosis? After menopause, you may be at increased risk for osteoporosis. Osteoporosis is a condition in which bone destruction happens more quickly than new bone creation. To help prevent osteoporosis or the bone fractures that can happen because of osteoporosis, you may take the following actions:  If you are 35-108 years old, get at least 1,000 mg of calcium and at least 600 mg of vitamin D per day.  If you are older than age 56 but younger than age 76, get at least 1,200 mg of calcium and at least 600 mg of vitamin D per day.  If you are older than age 36, get at least 1,200 mg of calcium and at least 800 mg of vitamin D per day. Smoking and drinking excessive alcohol increase the risk of osteoporosis. Eat foods that are rich in calcium and vitamin D, and do weight-bearing exercises several times each week as directed by your health care provider. How does menopause affect my mental health? Depression may occur at any age, but it is more common as you become older. Common symptoms of depression include:  Low or sad mood.  Changes in sleep patterns.  Changes in appetite or eating patterns.  Feeling an overall lack of motivation or enjoyment of activities that you previously enjoyed.  Frequent crying spells. Talk with your health care provider if you think that you are experiencing depression. General instructions See your health care provider for regular wellness exams and vaccines. This may include:  Scheduling regular health, dental, and eye exams.  Getting and maintaining your vaccines. These  include: ? Influenza vaccine. Get this vaccine each year before the flu season begins. ? Pneumonia vaccine. ? Shingles vaccine. ? Tetanus, diphtheria, and pertussis (Tdap) booster vaccine. Your health care provider may also recommend other immunizations. Tell your health care provider if you have ever been abused or do not feel safe at home. Summary  Menopause is a normal process in which your ability to get pregnant comes to an end.  This condition causes hot flashes, night sweats, decreased interest in sex, mood swings, headaches, or lack of sleep.  Treatment for this condition may include hormone replacement therapy.  Take actions to keep yourself healthy, including exercising regularly, eating a healthy diet, watching your weight, and checking your blood pressure and blood sugar levels.  Get screened for cancer and depression. Make sure that you are up to date with all your vaccines. This information is not intended to replace advice given to you by your health care provider. Make sure you discuss any questions you have with your health care provider. Document Released: 03/25/2005 Document Revised: 01/24/2018 Document Reviewed: 01/24/2018 Elsevier Patient Education  2020 [employer].

## 2018-09-12 ENCOUNTER — Encounter: Payer: Self-pay | Admitting: Internal Medicine

## 2018-09-12 DIAGNOSIS — D689 Coagulation defect, unspecified: Secondary | ICD-10-CM | POA: Insufficient documentation

## 2018-09-12 DIAGNOSIS — I10 Essential (primary) hypertension: Secondary | ICD-10-CM | POA: Insufficient documentation

## 2018-09-12 DIAGNOSIS — F4321 Adjustment disorder with depressed mood: Secondary | ICD-10-CM | POA: Insufficient documentation

## 2018-09-12 NOTE — Assessment & Plan Note (Signed)

## 2018-09-12 NOTE — Assessment & Plan Note (Signed)
Continue losartan,  Advised to reduce carvedilol given bradycardia and fatigue.  . May need to increase losartan dose to 50 mg

## 2018-09-12 NOTE — Assessment & Plan Note (Signed)
remeron trial

## 2018-09-12 NOTE — Assessment & Plan Note (Signed)
Secondary to loss of son in law.  Decreased energy and appetite ,  And insomnia,  Trial of remeron

## 2018-09-12 NOTE — Assessment & Plan Note (Signed)
Her systolic function has decreased from 55% to 45%  By repeat ECHO in July . She has been advised to continue daily weight checks to monitor for weight gain.

## 2018-09-12 NOTE — Assessment & Plan Note (Signed)
Thyroid was very underactive in march and dose was increased,  Repeat TSH is normal  Lab Results  Component Value Date   TSH 2.95 08/31/2018

## 2018-09-13 ENCOUNTER — Ambulatory Visit: Payer: Medicare HMO | Admitting: Family

## 2018-09-17 ENCOUNTER — Other Ambulatory Visit: Payer: Medicare HMO

## 2018-09-17 LAB — INTRINSIC FACTOR ANTIBODIES: Intrinsic Factor: NEGATIVE

## 2018-09-18 ENCOUNTER — Telehealth: Payer: Self-pay | Admitting: Internal Medicine

## 2018-09-18 DIAGNOSIS — I10 Essential (primary) hypertension: Secondary | ICD-10-CM

## 2018-09-18 NOTE — Telephone Encounter (Signed)
Pt called in to provide PCP with her weekly BP reading.   09/12/18-    130/52 HR 46 09/13/18-    129/48 HR 45 09/14/18-    120/47 HR 53 09/15/18 -     121/45 HR 48 09/16/18-      120/44 HR 46 09/17/18-      116/52 HR 48 09/18/18-      115/48 HR 51  Pt would like to be advised. Pt would like to know if she is suppose to still be taking losartan (COZAAR) 25 MG tablet  levothyroxine (SYNTHROID, LEVOTHROID) 50 MCG tablet ?     Pt says that she hasn't been taking he Lasix everyday because her weight has been consistent.

## 2018-09-18 NOTE — Assessment & Plan Note (Signed)
Continue losartan 25 mg daily and  Stop the carvedilol.  Repeat readings in one week

## 2018-09-18 NOTE — Telephone Encounter (Signed)
Continue losartan 25 mg daily and  Stop the carvedilol.  Repeat readings in one week

## 2018-09-18 NOTE — Telephone Encounter (Signed)
Spoke with pt and informed her of the medication changes. Pt repeated all instructions with understanding.

## 2018-09-21 ENCOUNTER — Telehealth: Payer: Self-pay

## 2018-09-21 MED ORDER — LOSARTAN POTASSIUM 25 MG PO TABS: 25.0000 mg | ORAL_TABLET | Freq: Every day | ORAL | 2 refills | Status: DC

## 2018-09-21 MED ORDER — RIVAROXABAN 15 MG PO TABS: 15.0000 mg | ORAL_TABLET | Freq: Every day | ORAL | 2 refills | Status: DC

## 2018-09-21 NOTE — Telephone Encounter (Signed)
Both are active .  90days supply sent

## 2018-09-21 NOTE — Telephone Encounter (Signed)
Received a refill request for Losartan and Xarelto. Both medications are historical. Is it okay to refill them?

## 2018-09-21 NOTE — Addendum Note (Signed)
Addended by: [name] on: 09/21/2018 12:56 PM   Modules accepted: Orders

## 2018-09-24 ENCOUNTER — Telehealth: Payer: Self-pay

## 2018-09-24 MED ORDER — LOSARTAN POTASSIUM 25 MG PO TABS: 25.0000 mg | ORAL_TABLET | Freq: Every day | ORAL | 2 refills | Status: DC

## 2018-09-24 NOTE — Telephone Encounter (Signed)
Medication resent electronically

## 2018-09-25 ENCOUNTER — Telehealth: Payer: Self-pay

## 2018-09-25 NOTE — Telephone Encounter (Signed)
Spoke with pt and informed her that she needs to continue taking the losartan 25mg  at bedtime. Pt gave a verbal understanding.

## 2018-09-25 NOTE — Telephone Encounter (Signed)
Readings improved for the last week.  Continue losartan 25 mg daily at bedtime

## 2018-09-25 NOTE — Telephone Encounter (Signed)
BP Readings  09/19/2018  162/62  48 09/20/2018  124/48  55 09/21/2018  113/48  48 09/22/2018  124/50  55 09/23/2018  Couldn't get a reading 09/24/2018 129/52  50 09/25/2018  138/57 54  Medications:        Losartan 25mg  once daily at bedtime

## 2018-10-01 ENCOUNTER — Ambulatory Visit (INDEPENDENT_AMBULATORY_CARE_PROVIDER_SITE_OTHER): Payer: Medicare HMO | Admitting: *Deleted

## 2018-10-01 ENCOUNTER — Other Ambulatory Visit: Payer: Self-pay

## 2018-10-01 DIAGNOSIS — E538 Deficiency of other specified B group vitamins: Secondary | ICD-10-CM | POA: Diagnosis not present

## 2018-10-01 MED ORDER — CYANOCOBALAMIN 1000 MCG/ML IJ SOLN
1000.0000 ug | Freq: Once | INTRAMUSCULAR | Status: AC
  Administered 2018-10-01: 1000 ug via INTRAMUSCULAR

## 2018-10-01 NOTE — Progress Notes (Signed)
Patient presented for B 12 injection to left deltoid, patient voiced no concerns nor showed any signs of distress during injection. 

## 2018-10-03 ENCOUNTER — Telehealth: Payer: Self-pay | Admitting: *Deleted

## 2018-10-03 NOTE — Telephone Encounter (Signed)
Copied from [place] [phone]. Topic: General - Inquiry >> Oct 03, 2018 11:18 AM Percell Belt A wrote: Reason for CRM: pt called in and was to call and get Dr Derrel Nip her bp readings  8/12- 125/54 and heart rate was 51 8/13-  119/53 and heart rate was 52 8/14- she took it after lunch 114/43 and heart rate was 50 8/15- 110/47 heart rate was 52 8/16- 121/54 heart rate was 50 8/17-111/50 heart rate was 66 8/18-102/43 heart rate was 46 9/19-140/55 heart rate was 50

## 2018-10-04 NOTE — Telephone Encounter (Signed)
bp is at goal for age,   No changes needed

## 2018-10-05 NOTE — Telephone Encounter (Signed)
Spoke with pt to let her know that her bp readings are at goal and she needs to continue taking her current medications.

## 2018-10-08 ENCOUNTER — Ambulatory Visit: Payer: Medicare HMO

## 2018-10-08 DIAGNOSIS — I10 Essential (primary) hypertension: Secondary | ICD-10-CM | POA: Diagnosis not present

## 2018-10-08 DIAGNOSIS — I071 Rheumatic tricuspid insufficiency: Secondary | ICD-10-CM | POA: Diagnosis not present

## 2018-10-08 DIAGNOSIS — I5020 Unspecified systolic (congestive) heart failure: Secondary | ICD-10-CM | POA: Diagnosis not present

## 2018-10-08 DIAGNOSIS — I050 Rheumatic mitral stenosis: Secondary | ICD-10-CM | POA: Diagnosis not present

## 2018-10-08 DIAGNOSIS — I480 Paroxysmal atrial fibrillation: Secondary | ICD-10-CM | POA: Diagnosis not present

## 2018-10-08 DIAGNOSIS — E782 Mixed hyperlipidemia: Secondary | ICD-10-CM | POA: Diagnosis not present

## 2018-10-10 ENCOUNTER — Other Ambulatory Visit: Payer: Self-pay

## 2018-10-10 ENCOUNTER — Ambulatory Visit (INDEPENDENT_AMBULATORY_CARE_PROVIDER_SITE_OTHER): Payer: Medicare HMO

## 2018-10-10 DIAGNOSIS — E538 Deficiency of other specified B group vitamins: Secondary | ICD-10-CM

## 2018-10-10 MED ORDER — CYANOCOBALAMIN 1000 MCG/ML IJ SOLN
1000.0000 ug | Freq: Once | INTRAMUSCULAR | Status: AC
  Administered 2018-10-10: 10:00:00 1000 ug via INTRAMUSCULAR

## 2018-10-10 NOTE — Progress Notes (Signed)
Patient presented today for B12 injection.  Administered IM in right deltoid.  Patient tolerated well with no signs of distress.   

## 2018-10-17 ENCOUNTER — Other Ambulatory Visit: Payer: Self-pay

## 2018-10-17 ENCOUNTER — Ambulatory Visit (INDEPENDENT_AMBULATORY_CARE_PROVIDER_SITE_OTHER): Payer: Medicare HMO | Admitting: *Deleted

## 2018-10-17 DIAGNOSIS — E538 Deficiency of other specified B group vitamins: Secondary | ICD-10-CM

## 2018-10-17 MED ORDER — CYANOCOBALAMIN 1000 MCG/ML IJ SOLN
1000.0000 ug | Freq: Once | INTRAMUSCULAR | Status: AC
  Administered 2018-10-17: 12:00:00 1000 ug via INTRAMUSCULAR

## 2018-10-17 NOTE — Progress Notes (Signed)
Patient presented for B 12 injection to left deltoid, patient voiced no concerns nor showed any signs of distress during injection. 

## 2018-11-07 ENCOUNTER — Ambulatory Visit
Admission: RE | Admit: 2018-11-07 | Discharge: 2018-11-07 | Disposition: A | Discharge status: Home / Self Care | Payer: Medicare HMO | Source: Ambulatory Visit | Attending: Internal Medicine | Admitting: Internal Medicine

## 2018-11-07 ENCOUNTER — Ambulatory Visit: Payer: Medicare HMO | Admitting: Internal Medicine

## 2018-11-07 DIAGNOSIS — Z1231 Encounter for screening mammogram for malignant neoplasm of breast: Secondary | ICD-10-CM

## 2018-11-13 DIAGNOSIS — H353221 Exudative age-related macular degeneration, left eye, with active choroidal neovascularization: Secondary | ICD-10-CM | POA: Diagnosis not present

## 2018-12-04 DIAGNOSIS — E559 Vitamin D deficiency, unspecified: Secondary | ICD-10-CM | POA: Diagnosis not present

## 2018-12-04 DIAGNOSIS — I480 Paroxysmal atrial fibrillation: Secondary | ICD-10-CM | POA: Diagnosis not present

## 2018-12-04 DIAGNOSIS — R195 Other fecal abnormalities: Secondary | ICD-10-CM | POA: Diagnosis not present

## 2018-12-04 DIAGNOSIS — R634 Abnormal weight loss: Secondary | ICD-10-CM | POA: Diagnosis not present

## 2018-12-04 DIAGNOSIS — E039 Hypothyroidism, unspecified: Secondary | ICD-10-CM | POA: Diagnosis not present

## 2018-12-04 DIAGNOSIS — R5383 Other fatigue: Secondary | ICD-10-CM | POA: Diagnosis not present

## 2018-12-04 DIAGNOSIS — R198 Other specified symptoms and signs involving the digestive system and abdomen: Secondary | ICD-10-CM | POA: Diagnosis not present

## 2018-12-14 DIAGNOSIS — R195 Other fecal abnormalities: Secondary | ICD-10-CM | POA: Diagnosis not present

## 2019-01-07 DIAGNOSIS — E782 Mixed hyperlipidemia: Secondary | ICD-10-CM | POA: Diagnosis not present

## 2019-01-07 DIAGNOSIS — I071 Rheumatic tricuspid insufficiency: Secondary | ICD-10-CM | POA: Diagnosis not present

## 2019-01-07 DIAGNOSIS — I5020 Unspecified systolic (congestive) heart failure: Secondary | ICD-10-CM | POA: Diagnosis not present

## 2019-01-07 DIAGNOSIS — I10 Essential (primary) hypertension: Secondary | ICD-10-CM | POA: Diagnosis not present

## 2019-01-07 DIAGNOSIS — I480 Paroxysmal atrial fibrillation: Secondary | ICD-10-CM | POA: Diagnosis not present

## 2019-01-07 DIAGNOSIS — I050 Rheumatic mitral stenosis: Secondary | ICD-10-CM | POA: Diagnosis not present

## 2019-01-07 DIAGNOSIS — I341 Nonrheumatic mitral (valve) prolapse: Secondary | ICD-10-CM | POA: Diagnosis not present

## 2019-01-08 DIAGNOSIS — H353221 Exudative age-related macular degeneration, left eye, with active choroidal neovascularization: Secondary | ICD-10-CM | POA: Diagnosis not present

## 2019-02-19 ENCOUNTER — Telehealth: Payer: Self-pay | Admitting: Internal Medicine

## 2019-02-19 DIAGNOSIS — E034 Atrophy of thyroid (acquired): Secondary | ICD-10-CM

## 2019-02-19 NOTE — Addendum Note (Signed)
Addended by: [name] on: 02/19/2019 05:02 PM   Modules accepted: Orders

## 2019-02-19 NOTE — Telephone Encounter (Signed)
Yes,  6 weeks after medicatio n change

## 2019-02-19 NOTE — Telephone Encounter (Signed)
Spoke with pharmacy to let them know that it was okay to switch manufacturers. Daughter is wanting to know if pt should come in to have repeat labs done since the manufacturer will be changed?

## 2019-02-19 NOTE — Telephone Encounter (Signed)
Pt daughter called about the medication of levothyroxine (SYNTHROID, LEVOTHROID) 50 MCG tablet can no longer be purchased from the current manufacturer. Pharmacy needs the ok to be able to fill Rx from the manufacturer and because of company switching pharmacist thought pt may need to have labs done. Please advise and Thank you!  Call pt @ [phone].

## 2019-02-20 NOTE — Telephone Encounter (Signed)
Spoke with pt and scheduled her follow up lab appt in six weeks.

## 2019-03-05 DIAGNOSIS — H353221 Exudative age-related macular degeneration, left eye, with active choroidal neovascularization: Secondary | ICD-10-CM | POA: Diagnosis not present

## 2019-03-05 DIAGNOSIS — H353212 Exudative age-related macular degeneration, right eye, with inactive choroidal neovascularization: Secondary | ICD-10-CM | POA: Diagnosis not present

## 2019-03-06 DIAGNOSIS — M3501 Sicca syndrome with keratoconjunctivitis: Secondary | ICD-10-CM | POA: Diagnosis not present

## 2019-04-01 ENCOUNTER — Other Ambulatory Visit: Payer: Self-pay

## 2019-04-03 ENCOUNTER — Other Ambulatory Visit: Payer: Self-pay

## 2019-04-03 ENCOUNTER — Other Ambulatory Visit (INDEPENDENT_AMBULATORY_CARE_PROVIDER_SITE_OTHER): Payer: Medicare HMO

## 2019-04-03 ENCOUNTER — Ambulatory Visit: Payer: Medicare HMO | Admitting: Internal Medicine

## 2019-04-03 DIAGNOSIS — E034 Atrophy of thyroid (acquired): Secondary | ICD-10-CM | POA: Diagnosis not present

## 2019-04-03 DIAGNOSIS — R7401 Elevation of levels of liver transaminase levels: Secondary | ICD-10-CM | POA: Diagnosis not present

## 2019-04-03 LAB — COMPREHENSIVE METABOLIC PANEL
ALT: 28 U/L (ref 0–35)
AST: 24 U/L (ref 0–37)
Albumin: 4 g/dL (ref 3.5–5.2)
Alkaline Phosphatase: 84 U/L (ref 39–117)
BUN: 14 mg/dL (ref 6–23)
CO2: 29 mEq/L (ref 19–32)
Calcium: 9.1 mg/dL (ref 8.4–10.5)
Chloride: 106 mEq/L (ref 96–112)
Creatinine, Ser: 1.08 mg/dL (ref 0.40–1.20)
GFR: 48.04 mL/min — ABNORMAL LOW (ref >60.00)
Glucose, Bld: 115 mg/dL — ABNORMAL HIGH (ref 70–99)
Potassium: 4.6 mEq/L (ref 3.5–5.1)
Sodium: 139 mEq/L (ref 135–145)
Total Bilirubin: 0.8 mg/dL (ref 0.2–1.2)
Total Protein: 7 g/dL (ref 6.0–8.3)

## 2019-04-03 LAB — TSH: TSH: 3.91 u[IU]/mL (ref 0.35–4.50)

## 2019-04-05 ENCOUNTER — Telehealth: Payer: Self-pay | Admitting: Internal Medicine

## 2019-04-05 NOTE — Telephone Encounter (Signed)
Patient says she hurt her left arm at shoulder and elbow pulling and picking up grandchildren. Patient refused another appointment and UC. She will keep appointment for Tuesday with PCP unless symptoms worsen. Patient voiced understanding that symptoms change or worsen to go to UC immediately.

## 2019-04-08 ENCOUNTER — Ambulatory Visit: Payer: Medicare HMO | Admitting: Internal Medicine

## 2019-04-09 ENCOUNTER — Ambulatory Visit: Payer: Medicare HMO | Admitting: Internal Medicine

## 2019-04-11 ENCOUNTER — Encounter: Payer: Self-pay | Admitting: Internal Medicine

## 2019-04-11 ENCOUNTER — Other Ambulatory Visit: Payer: Self-pay

## 2019-04-11 ENCOUNTER — Ambulatory Visit (INDEPENDENT_AMBULATORY_CARE_PROVIDER_SITE_OTHER): Payer: Medicare HMO

## 2019-04-11 ENCOUNTER — Ambulatory Visit (INDEPENDENT_AMBULATORY_CARE_PROVIDER_SITE_OTHER): Payer: Medicare HMO | Admitting: Internal Medicine

## 2019-04-11 ENCOUNTER — Telehealth: Payer: Self-pay

## 2019-04-11 ENCOUNTER — Other Ambulatory Visit: Payer: Medicare HMO

## 2019-04-11 VITALS — BP 160/80 | HR 63 | Temp 97.1°F | Ht 63.0 in | Wt 111.8 lb

## 2019-04-11 DIAGNOSIS — E039 Hypothyroidism, unspecified: Secondary | ICD-10-CM | POA: Insufficient documentation

## 2019-04-11 DIAGNOSIS — M25512 Pain in left shoulder: Secondary | ICD-10-CM | POA: Diagnosis not present

## 2019-04-11 DIAGNOSIS — N1832 Chronic kidney disease, stage 3b: Secondary | ICD-10-CM | POA: Insufficient documentation

## 2019-04-11 DIAGNOSIS — N1831 Chronic kidney disease, stage 3a: Secondary | ICD-10-CM

## 2019-04-11 DIAGNOSIS — M19012 Primary osteoarthritis, left shoulder: Secondary | ICD-10-CM | POA: Diagnosis not present

## 2019-04-11 DIAGNOSIS — M79622 Pain in left upper arm: Secondary | ICD-10-CM

## 2019-04-11 DIAGNOSIS — L309 Dermatitis, unspecified: Secondary | ICD-10-CM

## 2019-04-11 DIAGNOSIS — I10 Essential (primary) hypertension: Secondary | ICD-10-CM | POA: Diagnosis not present

## 2019-04-11 MED ORDER — TRIAMCINOLONE ACETONIDE 0.1 % EX CREA: 1.0000 "application " | TOPICAL_CREAM | Freq: Two times a day (BID) | CUTANEOUS | 0 refills | Status: DC

## 2019-04-11 MED ORDER — DICLOFENAC SODIUM 1 % EX GEL: 2.0000 g | Freq: Four times a day (QID) | CUTANEOUS | 2 refills | Status: DC

## 2019-04-11 MED ORDER — LOSARTAN POTASSIUM 50 MG PO TABS: 50.0000 mg | ORAL_TABLET | Freq: Every day | ORAL | 3 refills | Status: DC

## 2019-04-11 NOTE — Patient Instructions (Addendum)
Goal Blood pressure <130/<80  If  <110 could feel lightheaded but definitely if <90/<60 too low  Increase water to 50 ounces daily   Dr. Mauro Kaufmann for shoulder     Shoulder Range of Motion Exercises Shoulder range of motion (ROM) exercises are done to keep the shoulder moving freely or to increase movement. They are often recommended for people who have shoulder pain or stiffness or who are recovering from a shoulder surgery. Phase 1 exercises When you are able, do this exercise 1-2 times per day for 30-60 seconds in each direction, or as directed by your health care provider. Pendulum exercise To do this exercise while sitting: 1. Sit in a chair or at the edge of your bed with your feet flat on the floor. 2. Let your affected arm hang down in front of you over the edge of the bed or chair. 3. Relax your shoulder, arm, and hand. Tok your body so your arm gently swings in small circles. You can also use your unaffected arm to start the motion. 5. Repeat changing the direction of the circles, swinging your arm left and right, and swinging your arm forward and back. To do this exercise while standing: 1. Stand next to a sturdy chair or table, and hold on to it with your hand on your unaffected side. 2. Bend forward at the waist. 3. Bend your knees slightly. 4. Relax your shoulder, arm, and hand. 5. While keeping your shoulder relaxed, use body motion to swing your arm in small circles. 6. Repeat changing the direction of the circles, swinging your arm left and right, and swinging your arm forward and back. 7. Between exercises, stand up tall and take a short break to relax your lower back.  Phase 2 exercises Do these exercises 1-2 times per day or as told by your health care provider. Hold each stretch for 30 seconds, and repeat 3 times. Do the exercises with one or both arms as instructed by your health care provider. For these exercises, sit at a table with your hand and arm  supported by the table. A chair that slides easily or has wheels can be helpful. External rotation 1. Turn your chair so that your affected side is nearest to the table. 2. Place your forearm on the table to your side. Bend your elbow about 90 at the elbow (right angle) and place your hand palm facing down on the table. Your elbow should be about 6 inches away from your side. 3. Keeping your arm on the table, lean your body forward. Abduction 1. Turn your chair so that your affected side is nearest to the table. 2. Place your forearm and hand on the table so that your thumb points toward the ceiling and your arm is straight out to your side. 3. Slide your hand out to the side and away from you, using your unaffected arm to do the work. 4. To increase the stretch, you can slide your chair away from the table. Flexion: forward stretch 1. Sit facing the table. Place your hand and elbow on the table in front of you. 2. Slide your hand forward and away from you, using your unaffected arm to do the work. 3. To increase the stretch, you can slide your chair backward. Phase 3 exercises Do these exercises 1-2 times per day or as told by your health care provider. Hold each stretch for 30 seconds, and repeat 3 times. Do the exercises with one or both arms  as instructed by your health care provider. Cross-body stretch: posterior capsule stretch 1. Lift your arm straight out in front of you. 2. Bend your arm 90 at the elbow (right angle) so your forearm moves across your body. 3. Use your other arm to gently pull the elbow across your body, toward your other shoulder. Wall climbs 1. Stand with your affected arm extended out to the side with your hand resting on a door frame. 2. Slide your hand slowly up the door frame. 3. To increase the stretch, step through the door frame. Keep your body upright and do not lean. Wand exercises You will need a cane, a piece of PVC pipe, or a sturdy wooden dowel for  wand exercises. Flexion To do this exercise while standing: 1. Hold the wand with both of your hands, palms down. 2. Using the other arm to help, lift your arms up and over your head, if able. 3. Push upward with your other arm to gently increase the stretch. To do this exercise while lying down: 1. Lie on your back with your elbows resting on the floor and the wand in both your hands. Your hands will be palm down, or pointing toward your feet. 2. Lift your hands toward the ceiling, using your unaffected arm to help if needed. 3. Bring your arms overhead as able, using your unaffected arm to help if needed. Internal rotation 1. Stand while holding the wand behind you with both hands. Your unaffected arm should be extended above your head with the arm of the affected side extended behind you at the level of your waist. The wand should be pointing straight up and down as you hold it. 2. Slowly pull the wand up behind your back by straightening the elbow of your unaffected arm and bending the elbow of your affected arm. External rotation 1. Lie on your back with your affected upper arm supported on a small pillow or rolled towel. When you first do this exercise, keep your upper arm close to your body. Over time, bring your arm up to a 90 angle out to the side. 2. Hold the wand across your stomach and with both hands palm up. Your elbow on your affected side should be bent at a 90 angle. 3. Use your unaffected side to help push your forearm away from you and toward the floor. Keep your elbow on your affected side bent at a 90 angle. Contact a health care provider if you have:  New or increasing pain.  New numbness, tingling, weakness, or discoloration in your arm or hand. This information is not intended to replace advice given to you by your health care provider. Make sure you discuss any questions you have with your health care provider. Document Revised: 03/15/2017 Document Reviewed:  03/15/2017 Elsevier Patient Education  [place].  Shoulder Exercises Ask your health care provider which exercises are safe for you. Do exercises exactly as told by your health care provider and adjust them as directed. It is normal to feel mild stretching, pulling, tightness, or discomfort as you do these exercises. Stop right away if you feel sudden pain or your pain gets worse. Do not begin these exercises until told by your health care provider. Stretching exercises External rotation and abduction This exercise is sometimes called corner stretch. This exercise rotates your arm outward (external rotation) and moves your arm out from your body (abduction). 1. Stand in a doorway with one of your feet slightly in front of  the other. This is called a staggered stance. If you cannot reach your forearms to the door frame, stand facing a corner of a room. 2. Choose one of the following positions as told by your health care provider: ? Place your hands and forearms on the door frame above your head. ? Place your hands and forearms on the door frame at the height of your head. ? Place your hands on the door frame at the height of your elbows. 3. Slowly move your weight onto your front foot until you feel a stretch across your chest and in the front of your shoulders. Keep your head and chest upright and keep your abdominal muscles tight. 4. Hold for __________ seconds. 5. To release the stretch, shift your weight to your back foot. Repeat __________ times. Complete this exercise __________ times a day. Extension, standing 1. Stand and hold a broomstick, a cane, or a similar object behind your back. ? Your hands should be a little wider than shoulder width apart. ? Your palms should face away from your back. 2. Keeping your elbows straight and your shoulder muscles relaxed, move the stick away from your body until you feel a stretch in your shoulders (extension). ? Avoid shrugging your  shoulders while you move the stick. Keep your shoulder blades tucked down toward the middle of your back. 3. Hold for __________ seconds. 4. Slowly return to the starting position. Repeat __________ times. Complete this exercise __________ times a day. Range-of-motion exercises Pendulum  1. Stand near a wall or a surface that you can hold onto for balance. 2. Bend at the waist and let your left / right arm hang straight down. Use your other arm to support you. Keep your back straight and do not lock your knees. 3. Relax your left / right arm and shoulder muscles, and move your hips and your trunk so your left / right arm swings freely. Your arm should swing because of the motion of your body, not because you are using your arm or shoulder muscles. 4. Keep moving your hips and trunk so your arm swings in the following directions, as told by your health care provider: ? Side to side. ? Forward and backward. ? In clockwise and counterclockwise circles. 5. Continue each motion for __________ seconds, or for as long as told by your health care provider. 6. Slowly return to the starting position. Repeat __________ times. Complete this exercise __________ times a day. Shoulder flexion, standing  1. Stand and hold a broomstick, a cane, or a similar object. Place your hands a little more than shoulder width apart on the object. Your left / right hand should be palm up, and your other hand should be palm down. 2. Keep your elbow straight and your shoulder muscles relaxed. Push the stick up with your healthy arm to raise your left / right arm in front of your body, and then over your head until you feel a stretch in your shoulder (flexion). ? Avoid shrugging your shoulder while you raise your arm. Keep your shoulder blade tucked down toward the middle of your back. 3. Hold for __________ seconds. 4. Slowly return to the starting position. Repeat __________ times. Complete this exercise __________ times  a day. Shoulder abduction, standing 1. Stand and hold a broomstick, a cane, or a similar object. Place your hands a little more than shoulder width apart on the object. Your left / right hand should be palm up, and your other hand should be  palm down. 2. Keep your elbow straight and your shoulder muscles relaxed. Push the object across your body toward your left / right side. Raise your left / right arm to the side of your body (abduction) until you feel a stretch in your shoulder. ? Do not raise your arm above shoulder height unless your health care provider tells you to do that. ? If directed, raise your arm over your head. ? Avoid shrugging your shoulder while you raise your arm. Keep your shoulder blade tucked down toward the middle of your back. 3. Hold for __________ seconds. 4. Slowly return to the starting position. Repeat __________ times. Complete this exercise __________ times a day. Internal rotation  1. Place your left / right hand behind your back, palm up. 2. Use your other hand to dangle an exercise band, a towel, or a similar object over your shoulder. Grasp the band with your left / right hand so you are holding on to both ends. 3. Gently pull up on the band until you feel a stretch in the front of your left / right shoulder. The movement of your arm toward the center of your body is called internal rotation. ? Avoid shrugging your shoulder while you raise your arm. Keep your shoulder blade tucked down toward the middle of your back. 4. Hold for __________ seconds. 5. Release the stretch by letting go of the band and lowering your hands. Repeat __________ times. Complete this exercise __________ times a day. Strengthening exercises External rotation  1. Sit in a stable chair without armrests. 2. Secure an exercise band to a stable object at elbow height on your left / right side. 3. Place a soft object, such as a folded towel or a small pillow, between your left / right  upper arm and your body to move your elbow about 4 inches (10 cm) away from your side. 4. Hold the end of the exercise band so it is tight and there is no slack. 5. Keeping your elbow pressed against the soft object, slowly move your forearm out, away from your abdomen (external rotation). Keep your body steady so only your forearm moves. 6. Hold for __________ seconds. 7. Slowly return to the starting position. Repeat __________ times. Complete this exercise __________ times a day. Shoulder abduction  1. Sit in a stable chair without armrests, or stand up. 2. Hold a __________ weight in your left / right hand, or hold an exercise band with both hands. 3. Start with your arms straight down and your left / right palm facing in, toward your body. 4. Slowly lift your left / right hand out to your side (abduction). Do not lift your hand above shoulder height unless your health care provider tells you that this is safe. ? Keep your arms straight. ? Avoid shrugging your shoulder while you do this movement. Keep your shoulder blade tucked down toward the middle of your back. 5. Hold for __________ seconds. 6. Slowly lower your arm, and return to the starting position. Repeat __________ times. Complete this exercise __________ times a day. Shoulder extension 1. Sit in a stable chair without armrests, or stand up. 2. Secure an exercise band to a stable object in front of you so it is at shoulder height. 3. Hold one end of the exercise band in each hand. Your palms should face each other. 4. Straighten your elbows and lift your hands up to shoulder height. 5. Step back, away from the secured end of the exercise  band, until the band is tight and there is no slack. 6. Squeeze your shoulder blades together as you pull your hands down to the sides of your thighs (extension). Stop when your hands are straight down by your sides. Do not let your hands go behind your body. 7. Hold for __________  seconds. 8. Slowly return to the starting position. Repeat __________ times. Complete this exercise __________ times a day. Shoulder row 1. Sit in a stable chair without armrests, or stand up. 2. Secure an exercise band to a stable object in front of you so it is at waist height. 3. Hold one end of the exercise band in each hand. Position your palms so that your thumbs are facing the ceiling (neutral position). 4. Bend each of your elbows to a 90-degree angle (right angle) and keep your upper arms at your sides. 5. Step back until the band is tight and there is no slack. 6. Slowly pull your elbows back behind you. 7. Hold for __________ seconds. 8. Slowly return to the starting position. Repeat __________ times. Complete this exercise __________ times a day. Shoulder press-ups  1. Sit in a stable chair that has armrests. Sit upright, with your feet flat on the floor. 2. Put your hands on the armrests so your elbows are bent and your fingers are pointing forward. Your hands should be about even with the sides of your body. 3. Push down on the armrests and use your arms to lift yourself off the chair. Straighten your elbows and lift yourself up as much as you comfortably can. ? Move your shoulder blades down, and avoid letting your shoulders move up toward your ears. ? Keep your feet on the ground. As you get stronger, your feet should support less of your body weight as you lift yourself up. 4. Hold for __________ seconds. 5. Slowly lower yourself back into the chair. Repeat __________ times. Complete this exercise __________ times a day. Wall push-ups  1. Stand so you are facing a stable wall. Your feet should be about one arm-length away from the wall. 2. Lean forward and place your palms on the wall at shoulder height. 3. Keep your feet flat on the floor as you bend your elbows and lean forward toward the wall. 4. Hold for __________ seconds. 5. Straighten your elbows to push yourself  back to the starting position. Repeat __________ times. Complete this exercise __________ times a day. This information is not intended to replace advice given to you by your health care provider. Make sure you discuss any questions you have with your health care provider. Document Revised: 05/25/2018 Document Reviewed: 03/02/2018 Elsevier Patient Education  [place].  Chronic Kidney Disease, Adult Chronic kidney disease (CKD) happens when the kidneys are damaged over a long period of time. The kidneys are two organs that help with:  Getting rid of waste and extra fluid from the blood.  Making hormones that maintain the amount of fluid in your tissues and blood vessels.  Making sure that the body has the right amount of fluids and chemicals. Most of the time, CKD does not go away, but it can usually be controlled. Steps must be taken to slow down the kidney damage or to stop it from getting worse. If this is not done, the kidneys may stop working. Follow these instructions at home: Medicines  Take over-the-counter and prescription medicines only as told by your doctor. You may need to change the amount of medicines you take.  Do not take any new medicines unless your doctor says it is okay. Many medicines can make your kidney damage worse.  Do not take any vitamin and supplements unless your doctor says it is okay. Many vitamins and supplements can make your kidney damage worse. General instructions  Follow a diet as told by your doctor. You may need to stay away from: ? Alcohol. ? Salty foods. ? Foods that are high in:  Potassium.  Calcium.  Protein.  Do not use any products that contain nicotine or tobacco, such as cigarettes and e-cigarettes. If you need help quitting, ask your doctor.  Keep track of your blood pressure at home. Tell your doctor about any changes.  If you have diabetes, keep track of your blood sugar as told by your doctor.  Try to stay at a  healthy weight. If you need help, ask your doctor.  Exercise at least 30 minutes a day, 5 days a week.  Stay up-to-date with your shots (immunizations) as told by your doctor.  Keep all follow-up visits as told by your doctor. This is important. Contact a doctor if:  Your symptoms get worse.  You have new symptoms. Get help right away if:  You have symptoms of end-stage kidney disease. These may include: ? Headaches. ? Numbness in your hands or feet. ? Easy bruising. ? Having hiccups often. ? Chest pain. ? Shortness of breath. ? Stopping of menstrual periods in women.  You have a fever.  You have very little pee (urine).  You have pain or bleeding when you pee. Summary  Chronic kidney disease (CKD) happens when the kidneys are damaged over a long period of time.  Most of the time, this condition does not go away, but it can usually be controlled. Steps must be taken to slow down the kidney damage or to stop it from getting worse.  Treatment may include a combination of medicines and lifestyle changes. This information is not intended to replace advice given to you by your health care provider. Make sure you discuss any questions you have with your health care provider. Document Revised: 01/13/2017 Document Reviewed: 03/07/2016 Elsevier Patient Education  2020 [employer].

## 2019-04-11 NOTE — Progress Notes (Signed)
Chief Complaint  Patient presents with  . Muscle Pain    thinks she pulled a muscle in her left arm one week ago while lifting her great grandchild. pain 4/10 with movement.   . Rash    Patient has itchy bumps across her back that have been there for a month.    F/u  1. Left upper arm pain 1 week ago 10/10 now 4/10 yesterday and today she was in the process of spanking great grands and reached and then had muscle pain and pain in upper arm with reduced ROM and pain esp overhead movements  Nothing tried other than tylenol w/o help  2. Reviewed labs 04/03/19 CKD 3 increase water intake at least 50 oz daily avoid nsaids she is already doing  3. Hypothyroidism on levo 50 mcg qd and tsh recently normal 04/03/19 given copy of labs  4. Itchy rash to upper back and she is scratching using moisturizer and soft soap we disc dove soap today  5. HTN BP elevated today and on losartan 25 mg qd and coreg 3.125 bid pt informed nurse she is not taking   Of notes per RN today pt not taking remeron 15, protonix 40, kdur 10, vitamin D, avastin, xanax to be disc'ed next PCP appt    Review of Systems  Respiratory: Negative for shortness of breath.   Cardiovascular: Negative for chest pain.  Musculoskeletal: Positive for joint pain.  Skin: Positive for itching and rash.   Past Medical History:  Diagnosis Date  . Atrial fibrillation ([place])   . GERD (gastroesophageal reflux disease)   . IPF (idiopathic pulmonary fibrosis) ([place]) [address]11   by CT, PFTS done by Humphrey Rolls  . Mitral regurgitation   . Pulmonary embolism ([place]) 02/2016   Past Surgical History:  Procedure Laterality Date  . APPENDECTOMY  1960  . BREAST CYST ASPIRATION Left 2017  . CARDIOVERSION N/A 04/05/2016   Procedure: Cardioversion;  Surgeon: [name], MD;  Location: [facility] ORS;  Service: Cardiovascular;  Laterality: N/A;  . CHOLECYSTECTOMY  1985  . OVARIAN CYST REMOVAL    . TEE WITHOUT CARDIOVERSION N/A 02/01/2018   Procedure: TRANSESOPHAGEAL  ECHOCARDIOGRAM (TEE);  Surgeon: [name], MD;  Location: [facility] ORS;  Service: Cardiovascular;  Laterality: N/A;   Family History  Problem Relation Age of Onset  . Cancer Mother 66       breast cancer, lived to 69,   . Breast cancer Mother 66  . Cancer Son 49       pancreatic cancer  . Heart disease Son        CAD, Tobacco Abuse   . Heart disease Maternal Grandmother 22       died of massive MI  . Stroke Sister    Social History   Socioeconomic History  . Marital status: Widowed    Spouse name: Not on file  . Number of children: Not on file  . Years of education: Not on file  . Highest education level: Not on file  Occupational History  . Not on file  Tobacco Use  . Smoking status: Never Smoker  . Smokeless tobacco: Never Used  . Tobacco comment: 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 , worked at [employer], [employer] and Sexual Activity  . Alcohol use: No  . Drug use: No  . Sexual activity: Not Currently  Other Topics Concern  . Not on file  Social History Narrative   Has caretaking responsibility for 3 young grandchildren who live with her  Social Determinants of Health   Financial Resource Strain:   . Difficulty of Paying Living Expenses: Not on file  Food Insecurity:   . Worried About Charity fundraiser in the Last Year: Not on file  . Ran Out of Food in the Last Year: Not on file  Transportation Needs:   . Lack of Transportation (Medical): Not on file  . Lack of Transportation (Non-Medical): Not on file  Physical Activity:   . Days of Exercise per Week: Not on file  . Minutes of Exercise per Session: Not on file  Stress:   . Feeling of Stress : Not on file  Social Connections: Unknown  . Frequency of Communication with Friends and Family: Not on file  . Frequency of Social Gatherings with Friends and Family: Not on file  . Attends Religious Services: Not on file  . Active Member of Clubs or Organizations: Not on file  . Attends Archivist  Meetings: Not on file  . Marital Status: Widowed  Intimate Partner Violence:   . Fear of Current or Ex-Partner: Not on file  . Emotionally Abused: Not on file  . Physically Abused: Not on file  . Sexually Abused: Not on file   Current Meds  Medication Sig  . amiodarone (PACERONE) 200 MG tablet Take 200 mg by mouth daily.   Marland Kitchen levothyroxine (SYNTHROID, LEVOTHROID) 50 MCG tablet Take 1 tablet (50 mcg total) by mouth daily. 30 minutes prior to eating  . losartan (COZAAR) 50 MG tablet Take 1 tablet (50 mg total) by mouth daily. Do no take systolic blood pressure less than 110. Less than 130(top number)  take 25 mg 1/2 dose of 50 mg  . Rivaroxaban (XARELTO) 15 MG TABS tablet Take 1 tablet (15 mg total) by mouth daily.  . [DISCONTINUED] losartan (COZAAR) 25 MG tablet Take 1 tablet (25 mg total) by mouth daily. Hold for systolic blood pressure less than 110   No Known Allergies Recent Results (from the past 2160 hour(s))  TSH     Status: None   Collection Time: 04/03/19  8:52 AM  Result Value Ref Range   TSH 3.91 0.35 - 4.50 uIU/mL  Comprehensive metabolic panel     Status: Abnormal   Collection Time: 04/03/19  8:52 AM  Result Value Ref Range   Sodium 139 135 - 145 mEq/L   Potassium 4.6 3.5 - 5.1 mEq/L   Chloride 106 96 - 112 mEq/L   CO2 29 19 - 32 mEq/L   Glucose, Bld 115 (H) 70 - 99 mg/dL   BUN 14 6 - 23 mg/dL   Creatinine, Ser 1.08 0.40 - 1.20 mg/dL   Total Bilirubin 0.8 0.2 - 1.2 mg/dL   Alkaline Phosphatase 84 39 - 117 U/L   AST 24 0 - 37 U/L   ALT 28 0 - 35 U/L   Total Protein 7.0 6.0 - 8.3 g/dL   Albumin 4.0 3.5 - 5.2 g/dL   GFR 48.04 (L) >60.00 mL/min   Calcium 9.1 8.4 - 10.5 mg/dL   Objective  Body mass index is 19.8 kg/m. Wt Readings from Last 3 Encounters:  04/11/19 111 lb 12.8 oz (50.7 kg)  09/11/18 115 lb 12.8 oz (52.5 kg)  08/17/18 112 lb 6.4 oz (51 kg)   Temp Readings from Last 3 Encounters:  04/11/19 (!) 97.1 F (36.2 C) (Temporal)  09/11/18 98.3 F (36.8  C) (Oral)  08/17/18 98.1 F (36.7 C) (Oral)   BP Readings from Last 3  Encounters:  04/11/19 (!) 160/80  09/11/18 (!) 142/64  08/17/18 (!) 100/59   Pulse Readings from Last 3 Encounters:  04/11/19 63  09/11/18 (!) 46  08/17/18 (!) 56    Physical Exam Vitals and nursing note reviewed.  Constitutional:      Appearance: Normal appearance. She is well-developed and well-groomed.  HENT:     Head: Normocephalic and atraumatic.  Eyes:     Conjunctiva/sclera: Conjunctivae normal.     Pupils: Pupils are equal, round, and reactive to light.  Cardiovascular:     Rate and Rhythm: Normal rate and regular rhythm.     Pulses: Normal pulses.     Heart sounds: Normal heart sounds.  Musculoskeletal:        General: Tenderness present.     Left shoulder: Tenderness present. Decreased range of motion.       Arms:     Comments: ROM limited overhead    Skin:    General: Skin is warm and dry.  Neurological:     General: No focal deficit present.     Mental Status: She is alert and oriented to person, place, and time. Mental status is at baseline.  Psychiatric:        Attention and Perception: Attention and perception normal.        Mood and Affect: Mood and affect normal.        Speech: Speech normal.        Behavior: Behavior normal. Behavior is cooperative.        Thought Content: Thought content normal.        Cognition and Memory: Cognition and memory normal.        Judgment: Judgment normal.     Assessment  Plan  Acute pain of left shoulder likely rotator cuff issue vs OA - Plan: DG Shoulder Left, diclofenac Sodium (VOLTAREN) 1 % GEL Pain of left upper arm - Plan: DG Shoulder Left, diclofenac Sodium (VOLTAREN) 1 % GEL -prn tylenol  Disc Dr. Roland Rack in future pt will call back if referral wanted  Given shoulder exercises, heat   Essential hypertension - Plan: losartan (COZAAR) 50 MG tablet increased from 25 mg qd. She is to take 25 mg if BP 130 or less and hold the dose if sbp  <110 Of note mentioned today to cma she is not taking coreg 3.125 mg bid not sure why this is  F/u with pcp and cards  Eczema, unspecified type - Plan: triamcinolone cream (KENALOG) 0.1 %  Hypothyroidism, unspecified type  -cont same dose thyroid med levo 50 mcg qd  Given copy of labs today from 04/03/19 she states she had not heard about results   Provider: Dr. Olivia Mackie McLean-Scocuzza-[facility] Medicine

## 2019-04-11 NOTE — Telephone Encounter (Signed)
-----   Message from Delorise Jackson, MD sent at 04/11/2019 12:39 PM EST ----- Fax note to below Cleora Fleet, MD [address] [facility] [place], [place] [address] [phone] (Fax)

## 2019-04-11 NOTE — Telephone Encounter (Signed)
Faxed

## 2019-04-12 ENCOUNTER — Ambulatory Visit: Payer: Medicare HMO | Admitting: Nurse Practitioner

## 2019-04-22 ENCOUNTER — Other Ambulatory Visit: Payer: Self-pay

## 2019-04-24 ENCOUNTER — Ambulatory Visit (INDEPENDENT_AMBULATORY_CARE_PROVIDER_SITE_OTHER): Payer: Medicare HMO | Admitting: Internal Medicine

## 2019-04-24 ENCOUNTER — Encounter: Payer: Self-pay | Admitting: Internal Medicine

## 2019-04-24 ENCOUNTER — Other Ambulatory Visit: Payer: Self-pay

## 2019-04-24 DIAGNOSIS — S46212D Strain of muscle, fascia and tendon of other parts of biceps, left arm, subsequent encounter: Secondary | ICD-10-CM | POA: Diagnosis not present

## 2019-04-24 DIAGNOSIS — I10 Essential (primary) hypertension: Secondary | ICD-10-CM

## 2019-04-24 DIAGNOSIS — N1831 Chronic kidney disease, stage 3a: Secondary | ICD-10-CM | POA: Diagnosis not present

## 2019-04-24 DIAGNOSIS — E039 Hypothyroidism, unspecified: Secondary | ICD-10-CM | POA: Diagnosis not present

## 2019-04-24 DIAGNOSIS — I5042 Chronic combined systolic (congestive) and diastolic (congestive) heart failure: Secondary | ICD-10-CM

## 2019-04-24 DIAGNOSIS — R5383 Other fatigue: Secondary | ICD-10-CM

## 2019-04-24 MED ORDER — FUROSEMIDE 20 MG PO TABS: 20.0000 mg | ORAL_TABLET | Freq: Every day | ORAL | 1 refills | Status: DC

## 2019-04-24 NOTE — Assessment & Plan Note (Signed)
Likely bradycardia induced.  Advised to discuss amiodarone dose with cardiology,  Her fatigue is activity restricting

## 2019-04-24 NOTE — Patient Instructions (Signed)
Ask your cardiologist if your amiodarone dose can be reduced to 100 mg daily and add back carvedilol 3.125 mg twice daily if needed to control heart rate?   Continue using the gel on your biceps muscle for the strain and continue doing exercises to strengthen the muscle   Continue losartan 50 mg daily for blood pressure

## 2019-04-24 NOTE — Assessment & Plan Note (Signed)
Thyroid function is WNL on current dose.  No current changes needed.   Lab Results  Component Value Date   TSH 3.91 04/03/2019

## 2019-04-24 NOTE — Assessment & Plan Note (Signed)
Improved from initial encounter Feb 25.  Continue voltaren gel bid and exercises.

## 2019-04-24 NOTE — Assessment & Plan Note (Addendum)
Now taking losartan 50 mg daily.   No changes today given bradycardia and fatigue

## 2019-04-24 NOTE — Progress Notes (Signed)
Subjective:  Patient ID: Frances Kent, female    DOB: 1930/09/14  Age: 86 y.o. MRN: [id number]  CC: Diagnoses of Essential hypertension, Chronic combined systolic and diastolic congestive heart failure ([place]), Hypothyroidism, unspecified type, Stage 3a chronic kidney disease, Fatigue, unspecified type, and Biceps muscle strain, left, subsequent encounter were pertinent to this visit.  HPI [name] presents for  Follow up on multiple issues  This visit occurred during the SARS-CoV-2 public health emergency.  Safety protocols were in place, including screening questions prior to the visit, additional usage of staff PPE, and extensive cleaning of exam room while observing appropriate contact time as indicated for disinfecting solutions.    Treated by Dr Linus Orn on Feb 25 for acute injury to left arm   Hypertension,  And itchy rash    HTN:  HTN was elevated on losartan 25 mg.  BP meds increased Feb 25 to losartan 50 mg .    Atrial fib :  Has not been Not taking coreg since July .  Heart rate slow , on amiodarone  200 mg daily, patient c/o very tired all the time.  Has to really push herself to do her housework .  Cardiology opted to change nothing at last visit.  Has follow up next week   Legs feel completely worn out after a few minutes of stooping over to "pick up toys"  Symptoms resolve after a few minutes.  Does not feel dizzy.  Denies radicular pain from back   Left arm injury:  Pain started after spanking a toddler.  Was treated with diclofenac gel and exercises to prevent frozen shoulder. Not doing the exercises but shoulder improving with bid voltaren gel  Took a dose of furosemide for overnight wt gain of 1.5 lbs acc'd by nocturnal cough  Outpatient Medications Prior to Visit  Medication Sig Dispense Refill  . amiodarone (PACERONE) 200 MG tablet Take 200 mg by mouth daily.     . diclofenac Sodium (VOLTAREN) 1 % GEL Apply 2 g topically 4 (four) times daily. Prn left shoulder  150 g 2  . levothyroxine (SYNTHROID, LEVOTHROID) 50 MCG tablet Take 1 tablet (50 mcg total) by mouth daily. 30 minutes prior to eating 90 tablet 3  . losartan (COZAAR) 50 MG tablet Take 1 tablet (50 mg total) by mouth daily. Do no take systolic blood pressure less than 110. Less than 130(top number)  take 25 mg 1/2 dose of 50 mg 90 tablet 3  . Probiotic Product (PROBIOTIC ADVANCED) CAPS Take 1 capsule by mouth.    . Rivaroxaban (XARELTO) 15 MG TABS tablet Take 1 tablet (15 mg total) by mouth daily. 90 tablet 2  . triamcinolone cream (KENALOG) 0.1 % Apply 1 application topically 2 (two) times daily. Prn upper back avoid face, privates, under arms 454 g 0  . carvedilol (COREG) 3.125 MG tablet Take 1 tablet (3.125 mg total) by mouth 2 (two) times daily with a meal. Please hold for systolic blood pressure less than 110 60 tablet 0   No facility-administered medications prior to visit.    Review of Systems;  Patient denies headache, fevers, malaise, unintentional weight loss, skin rash, eye pain, sinus congestion and sinus pain, sore throat, dysphagia,  hemoptysis , cough, dyspnea, wheezing, chest pain, palpitations, orthopnea, edema, abdominal pain, nausea, melena, diarrhea, constipation, flank pain, dysuria, hematuria, urinary  Frequency, nocturia, numbness, tingling, seizures,  Focal weakness, Loss of consciousness,  Tremor, insomnia, depression, anxiety, and suicidal ideation.  Objective:  BP (!) 142/68 (BP Location: Right Arm, Patient Position: Sitting, Cuff Size: Normal)   Pulse (!) 52   Temp 97.8 F (36.6 C) (Temporal)   Resp 15   Ht 5\' 3"  (1.6 m)   Wt 110 lb 6.4 oz (50.1 kg)   SpO2 96%   BMI 19.56 kg/m   BP Readings from Last 3 Encounters:  04/24/19 (!) 142/68  04/11/19 (!) 160/80  09/11/18 (!) 142/64    Wt Readings from Last 3 Encounters:  04/24/19 110 lb 6.4 oz (50.1 kg)  04/11/19 111 lb 12.8 oz (50.7 kg)  09/11/18 115 lb 12.8 oz (52.5 kg)    General appearance:  alert, cooperative and appears stated age Ears: normal TM's and external ear canals both ears Throat: lips, mucosa, and tongue normal; teeth and gums normal Neck: no adenopathy, no carotid bruit, supple, symmetrical, trachea midline and thyroid not enlarged, symmetric, no tenderness/mass/nodules Back: symmetric, no curvature. ROM normal. No CVA tenderness. Lungs: clear to auscultation bilaterally Heart: regular rate and rhythm, S1, S2 normal, no murmur, click, rub or gallop Abdomen: soft, non-tender; bowel sounds normal; no masses,  no organomegaly Pulses: 2+ and symmetric Skin: Skin color, texture, turgor normal. No rashes or lesions Lymph nodes: Cervical, supraclavicular, and axillary nodes normal.  No results found for: HGBA1C  Lab Results  Component Value Date   CREATININE 1.08 04/03/2019   CREATININE 1.20 09/11/2018   CREATININE 1.28 (H) 08/31/2018    Lab Results  Component Value Date   WBC 4.9 09/11/2018   HGB 11.9 (L) 09/11/2018   HCT 37.2 09/11/2018   PLT 192.0 09/11/2018   GLUCOSE 115 (H) 04/03/2019   CHOL 208 (H) 08/31/2018   TRIG 94.0 08/31/2018   HDL 72.40 08/31/2018   LDLDIRECT 185.3 03/27/2013   LDLCALC 116 (H) 08/31/2018   ALT 28 04/03/2019   AST 24 04/03/2019   NA 139 04/03/2019   K 4.6 04/03/2019   CL 106 04/03/2019   CREATININE 1.08 04/03/2019   BUN 14 04/03/2019   CO2 29 04/03/2019   TSH 3.91 04/03/2019   INR 1.03 03/04/2016    MM 3D SCREEN BREAST BILATERAL  Result Date: 11/07/2018 CLINICAL DATA:  Screening. EXAM: DIGITAL SCREENING BILATERAL MAMMOGRAM WITH TOMO AND CAD COMPARISON:  Previous exam(s). ACR Breast Density Category b: There are scattered areas of fibroglandular density. FINDINGS: There are no findings suspicious for malignancy. Images were processed with CAD. IMPRESSION: No mammographic evidence of malignancy. A result letter of this screening mammogram will be mailed directly to the patient. RECOMMENDATION: Screening mammogram in one  year. (Code:SM-B-01Y) BI-RADS CATEGORY  1: Negative. Electronically Signed   By: Ammie Ferrier M.D.   On: 11/07/2018 15:20    Assessment & Plan:   Problem List Items Addressed This Visit      Unprioritized   Fatigue    Likely bradycardia induced.  Advised to discuss amiodarone dose with cardiology,  Her fatigue is activity restricting       CHF (congestive heart failure) ([place])    She has been weighing herself daily and used furosemide/potassium once recently for weight gain and dyspnea with resolution overnight       Relevant Medications   furosemide (LASIX) 20 MG tablet   Essential hypertension    Now taking losartan 50 mg daily.   No changes today given bradycardia and fatigue      Relevant Medications   furosemide (LASIX) 20 MG tablet   Hypothyroidism    Thyroid function is WNL on  current dose.  No current changes needed.   Lab Results  Component Value Date   TSH 3.91 04/03/2019         Stage 3a chronic kidney disease    Stable by serial labs.  No changes today       Biceps muscle strain, left, subsequent encounter    Improved from initial encounter Feb 25.  Continue voltaren gel bid and exercises.       A total of 30 minutes of face to face time was spent with patient more than half of which was spent in counselling and coordination of care   I have discontinued [employer]'s carvedilol. I have also changed her furosemide. Additionally, I am having her maintain her Probiotic Advanced, amiodarone, levothyroxine, Rivaroxaban, losartan, diclofenac Sodium, and triamcinolone cream.  Meds ordered this encounter  Medications  . furosemide (LASIX) 20 MG tablet    Sig: Take 1 tablet (20 mg total) by mouth daily. As needed for overnight weight gain    Dispense:  30 tablet    Refill:  1    KEEP ON FILE FOR FUTURE REFILLS    Medications Discontinued During This Encounter  Medication Reason  . carvedilol (COREG) 3.125 MG tablet     Follow-up: No  follow-ups on file.   [name], MD

## 2019-04-24 NOTE — Assessment & Plan Note (Signed)
She has been weighing herself daily and used furosemide/potassium once recently for weight gain and dyspnea with resolution overnight

## 2019-04-24 NOTE — Assessment & Plan Note (Signed)
Stable by serial labs.  No changes today

## 2019-05-07 DIAGNOSIS — H353212 Exudative age-related macular degeneration, right eye, with inactive choroidal neovascularization: Secondary | ICD-10-CM | POA: Diagnosis not present

## 2019-05-07 DIAGNOSIS — H353221 Exudative age-related macular degeneration, left eye, with active choroidal neovascularization: Secondary | ICD-10-CM | POA: Diagnosis not present

## 2019-05-08 DIAGNOSIS — I5020 Unspecified systolic (congestive) heart failure: Secondary | ICD-10-CM | POA: Diagnosis not present

## 2019-05-08 DIAGNOSIS — I10 Essential (primary) hypertension: Secondary | ICD-10-CM | POA: Diagnosis not present

## 2019-05-08 DIAGNOSIS — I480 Paroxysmal atrial fibrillation: Secondary | ICD-10-CM | POA: Diagnosis not present

## 2019-05-08 DIAGNOSIS — I341 Nonrheumatic mitral (valve) prolapse: Secondary | ICD-10-CM | POA: Diagnosis not present

## 2019-05-23 ENCOUNTER — Telehealth: Payer: Self-pay | Admitting: Internal Medicine

## 2019-05-23 NOTE — Telephone Encounter (Signed)
Spoken with patient, she has had SOB and a cough for the past two weeks. She stated it feels like when she had fluid on her heart. She was evaluated by cardio two weeks ago. Daily weight has also stayed the same. Informed her to contact her cardiologist, and if they are unable to see her today to please go to ED because of her past medical hx and sx. She stated she will comply.

## 2019-05-23 NOTE — Telephone Encounter (Signed)
Pt states that she has shortness of breath and a cough for the past two weeks. I could hear wheezing over the phone. Pt states that she has history of fluid around her heart. I transferred her to [place] at [facility].

## 2019-05-24 DIAGNOSIS — I480 Paroxysmal atrial fibrillation: Secondary | ICD-10-CM | POA: Diagnosis not present

## 2019-05-24 DIAGNOSIS — I10 Essential (primary) hypertension: Secondary | ICD-10-CM | POA: Diagnosis not present

## 2019-05-24 DIAGNOSIS — I5020 Unspecified systolic (congestive) heart failure: Secondary | ICD-10-CM | POA: Diagnosis not present

## 2019-05-24 DIAGNOSIS — E782 Mixed hyperlipidemia: Secondary | ICD-10-CM | POA: Diagnosis not present

## 2019-05-24 NOTE — Telephone Encounter (Signed)
[place] RECORD AccessNurse Patient Name: Frances Kent Gender: Female DOB: 07-14-1930 Age: 86 Y 28 M 22 D Return Phone Number: [phone] (Primary) Address: City/State/Zip: [place] [address] Client [facility] Client Site [place] - Day Physician Deborra Medina - MD Contact Type Call Who Is Calling Patient / Member / Family / Caregiver Call Type Triage / Clinical Relationship To Patient Self Return Phone Number [phone] (Primary) Chief Complaint BREATHING - shortness of breath or sounds breathless Reason for Call Symptomatic / Request for [place] states she has shortness of breath, coughing for two weeks, history of fluid around her heart last year with same symptoms, and wondering what to do. Translation No Nurse Assessment Nurse: Rock Nephew, RN, Juliann Pulse Date/Time (Eastern Time): 05/23/2019 10:40:14 AM Confirm and document reason for call. If symptomatic, describe symptoms. ---Caller states she has shortness of breath, coughing for two weeks, history of fluid around her heart last year with same symptoms, and wondering what to do. Has the patient had close contact with a person known or suspected to have the novel coronavirus illness OR traveled / lives in area with major community spread (including international travel) in the last 14 days from the onset of symptoms? * If Asymptomatic, screen for exposure and travel within the last 14 days. ---No Does the patient have any new or worsening symptoms? ---Yes Will a triage be completed? ---Yes Related visit to physician within the last 2 weeks? ---No Does the PT have any chronic conditions? (i.e. diabetes, asthma, this includes High risk factors for pregnancy, etc.) ---Yes List chronic conditions. ---Mitral Valve Prolapse, CHF, HTN, is on blood thinners ( she  is not sure why) Is this a behavioral health or substance abuse call? ---No Guidelines Guideline Title Affirmed Question Affirmed Notes Nurse Date/Time Eilene Ghazi Time) Cough - Chronic [1] MILD difficulty breathing (e.g., minimal/ Wells, RN, Juliann Pulse 05/23/2019 10:43:41 AMPLEASE NOTE: All timestamps contained within this report are represented as [place] Standard Time. CONFIDENTIALTY NOTICE: This fax transmission is intended only for the addressee. It contains information that is legally privileged, confidential or otherwise protected from use or disclosure. If you are not the intended recipient, you are strictly prohibited from reviewing, disclosing, copying using or disseminating any of this information or taking any action in reliance on or regarding this information. If you have received this fax in error, please notify us immediately by telephone so that we can arrange for its return to [place]. Phone: [phone], Toll-Free: [phone], Fax: [phone] Page: 2 of 2 Call Id: [id number] Guidelines Guideline Title Affirmed Question Affirmed Notes Nurse Date/Time Eilene Ghazi Time) no SOB at rest, SOB with walking, pulse <100) AND [2] still present when not coughing (Exception: no change from usual, chronic shortness of breath) Disp. Time Eilene Ghazi Time) Disposition Final User 05/23/2019 10:37:24 AM Send to Urgent Queue Rosana Fret 05/23/2019 10:49:45 AM See HCP within 4 Hours (or PCP triage) Yes Rock Nephew, RN, Gara Kroner Disagree/Comply Comply Caller Understands Yes PreDisposition Call Doctor Care Advice Given Per Guideline SEE HCP WITHIN 4 HOURS (OR PCP TRIAGE): * IF OFFICE WILL BE OPEN: You need to be seen within the next 3 or 4 hours. Call your doctor (or NP/PA) now or as soon as the office opens. CALL BACK IF: * You become worse. CARE ADVICE given per Cough - Chronic (Adult) guideline. Comments User: Valetta Mole, RN Date/Time Eilene Ghazi Time): 05/23/2019 10:56:23 AM I  spoke with  Fransisco Beau CMA and he recommended she call and speak with her cardiologist and if he cannot see her to be seen at the ED. Referrals Warm transfer to backline

## 2019-05-29 ENCOUNTER — Ambulatory Visit: Payer: Medicare HMO | Admitting: Internal Medicine

## 2019-05-30 DIAGNOSIS — I509 Heart failure, unspecified: Secondary | ICD-10-CM | POA: Diagnosis not present

## 2019-06-01 ENCOUNTER — Other Ambulatory Visit: Payer: Self-pay | Admitting: Internal Medicine

## 2019-06-13 ENCOUNTER — Encounter: Payer: Self-pay | Admitting: Nurse Practitioner

## 2019-06-13 ENCOUNTER — Other Ambulatory Visit: Payer: Self-pay

## 2019-06-13 ENCOUNTER — Ambulatory Visit: Payer: Medicare HMO | Admitting: Nurse Practitioner

## 2019-06-13 VITALS — BP 120/70 | HR 61 | Temp 97.1°F | Ht 63.0 in | Wt 105.2 lb

## 2019-06-13 DIAGNOSIS — L989 Disorder of the skin and subcutaneous tissue, unspecified: Secondary | ICD-10-CM

## 2019-06-13 DIAGNOSIS — R634 Abnormal weight loss: Secondary | ICD-10-CM

## 2019-06-13 NOTE — Assessment & Plan Note (Signed)
Left calf with reddened hard papule- referral to [facility].Concern about skin cancer.

## 2019-06-13 NOTE — Assessment & Plan Note (Signed)
Voices concerns about weight loss. TSH WNL. She wishes to discuss with Dr. Derrel Nip and appt made for next week.   Wt Readings from Last 3 Encounters:  06/13/19 105 lb 3.2 oz (47.7 kg)  04/24/19 110 lb 6.4 oz (50.1 kg)  04/11/19 111 lb 12.8 oz (50.7 kg)

## 2019-06-13 NOTE — Patient Instructions (Addendum)
I have placed a referral to Dr. [name] in dermatology to evaluate your left lower leg skin lesion.  I have requested an urgent appointment.  Please make a follow up for weight loss concerns next week.

## 2019-06-13 NOTE — Progress Notes (Signed)
Established Patient Office Visit  Subjective:  Patient ID: Frances Kent, female    DOB: 1930/04/07  Age: 86 y.o. MRN: [id number]  CC:  Chief Complaint  Patient presents with  . Acute Visit    painful lesion on right leg    HPI Frances Kent presents for evaluation of spot on her leg x 4 weeks. It does not bother her, unless she touches the top and it is tender. It is growing in size. She also has chronic constipation relieved with Colace and has noted weight loss over the last few months. She would like to talk to Dr. Derrel Nip about that.   Past Medical History:  Diagnosis Date  . Atrial fibrillation ([place])   . GERD (gastroesophageal reflux disease)   . IPF (idiopathic pulmonary fibrosis) ([place]) [address]11   by CT, PFTS done by Humphrey Rolls  . Mitral regurgitation   . Pulmonary embolism ([place]) 02/2016    Past Surgical History:  Procedure Laterality Date  . APPENDECTOMY  1960  . BREAST CYST ASPIRATION Left 2017  . CARDIOVERSION N/A 04/05/2016   Procedure: Cardioversion;  Surgeon: [name], MD;  Location: [facility] ORS;  Service: Cardiovascular;  Laterality: N/A;  . CHOLECYSTECTOMY  1985  . OVARIAN CYST REMOVAL    . TEE WITHOUT CARDIOVERSION N/A 02/01/2018   Procedure: TRANSESOPHAGEAL ECHOCARDIOGRAM (TEE);  Surgeon: [name], MD;  Location: [facility] ORS;  Service: Cardiovascular;  Laterality: N/A;    Family History  Problem Relation Age of Onset  . Cancer Mother 53       breast cancer, lived to 64,   . Breast cancer Mother 70  . Cancer Son 34       pancreatic cancer  . Heart disease Son        CAD, Tobacco Abuse   . Heart disease Maternal Grandmother 8       died of massive MI  . Stroke Sister     Social History   Socioeconomic History  . Marital status: Widowed    Spouse name: Not on file  . Number of children: Not on file  . Years of education: Not on file  . Highest education level: Not on file  Occupational History  . Not on file  Tobacco Use  . Smoking  status: Never Smoker  . Smokeless tobacco: Never Used  . Tobacco comment: 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 , worked at [employer], [employer] and Sexual Activity  . Alcohol use: No  . Drug use: No  . Sexual activity: Not Currently  Other Topics Concern  . Not on file  Social History Narrative   Has caretaking responsibility for 3 young grandchildren who live with her   Social Determinants of Health   Financial Resource Strain:   . Difficulty of Paying Living Expenses:   Food Insecurity:   . Worried About Charity fundraiser in the Last Year:   . Arboriculturist in the Last Year:   Transportation Needs:   . Film/video editor (Medical):   Marland Kitchen Lack of Transportation (Non-Medical):   Physical Activity:   . Days of Exercise per Week:   . Minutes of Exercise per Session:   Stress:   . Feeling of Stress :   Social Connections: Unknown  . Frequency of Communication with Friends and Family: Not on file  . Frequency of Social Gatherings with Friends and Family: Not on file  . Attends Religious Services: Not on file  . Active Member of Clubs  or Organizations: Not on file  . Attends Archivist Meetings: Not on file  . Marital Status: Widowed  Intimate Partner Violence:   . Fear of Current or Ex-Partner:   . Emotionally Abused:   Marland Kitchen Physically Abused:   . Sexually Abused:     Outpatient Medications Prior to Visit  Medication Sig Dispense Refill  . Bevacizumab (AVASTIN) 100 MG/4ML SOLN Inject into the vein.    Marland Kitchen diclofenac Sodium (VOLTAREN) 1 % GEL Apply 2 g topically 4 (four) times daily. Prn left shoulder 150 g 2  . EUTHYROX 50 MCG tablet TAKE 1 TABLET BY MOUTH ONCE DAILY (TAKE  30  MINUTES  PRIOR  TO  EATING) 90 tablet 0  . furosemide (LASIX) 20 MG tablet Take 1 tablet (20 mg total) by mouth daily. As needed for overnight weight gain 30 tablet 1  . Rivaroxaban (XARELTO) 15 MG TABS tablet Take 1 tablet (15 mg total) by mouth daily. 90 tablet 2  . triamcinolone cream  (KENALOG) 0.1 % Apply 1 application topically 2 (two) times daily. Prn upper back avoid face, privates, under arms 454 g 0  . amiodarone (PACERONE) 200 MG tablet Take by mouth.    . losartan (COZAAR) 50 MG tablet Take 1 tablet (50 mg total) by mouth daily. Do no take systolic blood pressure less than 110. Less than 130(top number)  take 25 mg 1/2 dose of 50 mg 90 tablet 3  . Probiotic Product (PROBIOTIC ADVANCED) CAPS Take 1 capsule by mouth.    Marland Kitchen amiodarone (PACERONE) 200 MG tablet Take 200 mg by mouth daily.      No facility-administered medications prior to visit.    No Known Allergies  Review of Systems  Constitutional: Negative for fever.  HENT: Negative for congestion, rhinorrhea and sore throat.   Respiratory: Negative for cough and shortness of breath.   Cardiovascular: Negative for chest pain and leg swelling.  Gastrointestinal: Positive for constipation.  Skin:       Skin lesion.       Objective:    Physical Exam  Constitutional:  Frail appearing female in NAD.  HENT:  Head: Normocephalic and atraumatic.  Skin: No pallor.  She has a erythematous papule on posterior calf that has dry skin around it and is hard and crusted anterior surface. The anterior surface has a discolored spot and it is slightly tender in that one area. Not draining, soft or fluctuant. This is a hard nodule. No surrounding cellulitis. She does have slight erythema/irritation around the lesion.     BP 120/70 (BP Location: Left Arm, Patient Position: Sitting, Cuff Size: Small)   Pulse 61   Temp (!) 97.1 F (36.2 C) (Skin)   Ht 5\' 3"  (1.6 m)   Wt 105 lb 3.2 oz (47.7 kg)   SpO2 98%   BMI 18.64 kg/m  Wt Readings from Last 3 Encounters:  06/13/19 105 lb 3.2 oz (47.7 kg)  04/24/19 110 lb 6.4 oz (50.1 kg)  04/11/19 111 lb 12.8 oz (50.7 kg)         Health Maintenance Due  Topic Date Due  . COVID-19 Vaccine (1) Never done    There are no preventive care reminders to display for this  patient.  Lab Results  Component Value Date   TSH 3.91 04/03/2019   Lab Results  Component Value Date   WBC 4.9 09/11/2018   HGB 11.9 (L) 09/11/2018   HCT 37.2 09/11/2018   MCV 93.9 09/11/2018  PLT 192.0 09/11/2018   Lab Results  Component Value Date   NA 139 04/03/2019   K 4.6 04/03/2019   CO2 29 04/03/2019   GLUCOSE 115 (H) 04/03/2019   BUN 14 04/03/2019   CREATININE 1.08 04/03/2019   BILITOT 0.8 04/03/2019   ALKPHOS 84 04/03/2019   AST 24 04/03/2019   ALT 28 04/03/2019   PROT 7.0 04/03/2019   ALBUMIN 4.0 04/03/2019   CALCIUM 9.1 04/03/2019   ANIONGAP 7 08/17/2018   GFR 48.04 (L) 04/03/2019   Lab Results  Component Value Date   CHOL 208 (H) 08/31/2018   Lab Results  Component Value Date   HDL 72.40 08/31/2018   Lab Results  Component Value Date   LDLCALC 116 (H) 08/31/2018   Lab Results  Component Value Date   TRIG 94.0 08/31/2018   Lab Results  Component Value Date   CHOLHDL 3 08/31/2018   No results found for: HGBA1C    Assessment & Plan:   Problem List Items Addressed This Visit      Musculoskeletal and Integument   Skin lesion of left lower extremity - Primary    Left calf with reddened hard papule- referral to [facility].Concern about skin cancer.       Relevant Orders   Ambulatory referral to Dermatology     Other   Unintentional weight loss    Voices concerns about weight loss. TSH WNL. She wishes to discuss with Dr. Derrel Nip and appt made for next week.   Wt Readings from Last 3 Encounters:  06/13/19 105 lb 3.2 oz (47.7 kg)  04/24/19 110 lb 6.4 oz (50.1 kg)  04/11/19 111 lb 12.8 oz (50.7 kg)          I have placed a referral to Dr. [name] in dermatology to evaluate your left lower leg skin lesion.  I have requested an urgent appointment. Concern about skin cancer.   Please make a follow up for weight loss concerns next week.  No orders of the defined types were placed in this  encounter.   Follow-up: Return in about 1 week (around 06/20/2019).    Denice Paradise, NP

## 2019-06-20 ENCOUNTER — Ambulatory Visit (INDEPENDENT_AMBULATORY_CARE_PROVIDER_SITE_OTHER): Payer: Medicare HMO | Admitting: Internal Medicine

## 2019-06-20 ENCOUNTER — Encounter: Payer: Self-pay | Admitting: Internal Medicine

## 2019-06-20 ENCOUNTER — Other Ambulatory Visit: Payer: Self-pay

## 2019-06-20 VITALS — BP 172/70 | HR 55 | Temp 96.5°F | Resp 15 | Ht 63.0 in | Wt 105.0 lb

## 2019-06-20 DIAGNOSIS — L989 Disorder of the skin and subcutaneous tissue, unspecified: Secondary | ICD-10-CM | POA: Diagnosis not present

## 2019-06-20 DIAGNOSIS — F411 Generalized anxiety disorder: Secondary | ICD-10-CM | POA: Diagnosis not present

## 2019-06-20 DIAGNOSIS — I10 Essential (primary) hypertension: Secondary | ICD-10-CM | POA: Diagnosis not present

## 2019-06-20 DIAGNOSIS — J84112 Idiopathic pulmonary fibrosis: Secondary | ICD-10-CM | POA: Diagnosis not present

## 2019-06-20 DIAGNOSIS — E538 Deficiency of other specified B group vitamins: Secondary | ICD-10-CM

## 2019-06-20 DIAGNOSIS — D492 Neoplasm of unspecified behavior of bone, soft tissue, and skin: Secondary | ICD-10-CM | POA: Diagnosis not present

## 2019-06-20 DIAGNOSIS — E034 Atrophy of thyroid (acquired): Secondary | ICD-10-CM | POA: Diagnosis not present

## 2019-06-20 DIAGNOSIS — D649 Anemia, unspecified: Secondary | ICD-10-CM

## 2019-06-20 DIAGNOSIS — J8410 Pulmonary fibrosis, unspecified: Secondary | ICD-10-CM | POA: Diagnosis not present

## 2019-06-20 DIAGNOSIS — R634 Abnormal weight loss: Secondary | ICD-10-CM

## 2019-06-20 LAB — COMPREHENSIVE METABOLIC PANEL
ALT: 37 U/L — ABNORMAL HIGH (ref 0–35)
AST: 36 U/L (ref 0–37)
Albumin: 4.3 g/dL (ref 3.5–5.2)
Alkaline Phosphatase: 89 U/L (ref 39–117)
BUN: 16 mg/dL (ref 6–23)
CO2: 28 mEq/L (ref 19–32)
Calcium: 9.3 mg/dL (ref 8.4–10.5)
Chloride: 104 mEq/L (ref 96–112)
Creatinine, Ser: 1.1 mg/dL (ref 0.40–1.20)
GFR: 47.01 mL/min — ABNORMAL LOW (ref >60.00)
Glucose, Bld: 81 mg/dL (ref 70–99)
Potassium: 4.4 mEq/L (ref 3.5–5.1)
Sodium: 139 mEq/L (ref 135–145)
Total Bilirubin: 0.7 mg/dL (ref 0.2–1.2)
Total Protein: 7.3 g/dL (ref 6.0–8.3)

## 2019-06-20 LAB — CBC WITH DIFFERENTIAL/PLATELET
Basophils Absolute: 0.2 10*3/uL — ABNORMAL HIGH (ref 0.0–0.1)
Basophils Relative: 3.6 % — ABNORMAL HIGH (ref 0.0–3.0)
Eosinophils Absolute: 0 10*3/uL (ref 0.0–0.7)
Eosinophils Relative: 0.6 % (ref 0.0–5.0)
HCT: 40.6 % (ref 36.0–46.0)
Hemoglobin: 13.1 g/dL (ref 12.0–15.0)
Lymphocytes Relative: 20.4 % (ref 12.0–46.0)
Lymphs Abs: 0.9 10*3/uL (ref 0.7–4.0)
MCHC: 32.3 g/dL (ref 30.0–36.0)
MCV: 89.8 fl (ref 78.0–100.0)
Monocytes Absolute: 0.5 10*3/uL (ref 0.1–1.0)
Monocytes Relative: 11.9 % (ref 3.0–12.0)
Neutro Abs: 2.9 10*3/uL (ref 1.4–7.7)
Neutrophils Relative %: 63.5 % (ref 43.0–77.0)
Platelets: 244 10*3/uL (ref 150.0–400.0)
RBC: 4.52 Mil/uL (ref 3.87–5.11)
RDW: 16.9 % — ABNORMAL HIGH (ref 11.5–15.5)
WBC: 4.6 10*3/uL (ref 4.0–10.5)

## 2019-06-20 LAB — TSH: TSH: 2.94 u[IU]/mL (ref 0.35–4.50)

## 2019-06-20 MED ORDER — CYANOCOBALAMIN 1000 MCG/ML IJ SOLN
1000.0000 ug | Freq: Once | INTRAMUSCULAR | Status: AC
  Administered 2019-06-20: 1000 ug via INTRAMUSCULAR

## 2019-06-20 MED ORDER — ALPRAZOLAM 0.25 MG PO TABS: 0.2500 mg | ORAL_TABLET | Freq: Two times a day (BID) | ORAL | 0 refills | Status: DC | PRN

## 2019-06-20 MED ORDER — MIRTAZAPINE 15 MG PO TABS
ORAL_TABLET | ORAL | 2 refills | Status: DC
Start: 2019-06-20 — End: 2019-09-12

## 2019-06-20 NOTE — Patient Instructions (Signed)
I want you to start taking mirtazapine at bedtime to improve your appetite and energy  Start with 1/2 tablet for 4 days,  Then increase to full tablet   You can use the alprazolam as needed for your nerves.  Not more than one per day   Pulmonology and dermatology referrals in process

## 2019-06-20 NOTE — Progress Notes (Signed)
Subjective:  Patient ID: Frances Kent, female    DOB: 06-Oct-1930  Age: 86 y.o. MRN: [id number]  CC: The primary encounter diagnosis was Anemia, unspecified type. Diagnoses of Skin neoplasm, Pulmonary fibrosis ([place]), B12 deficiency, Essential hypertension, Generalized anxiety disorder, Hypothyroidism due to acquired atrophy of thyroid, IPF (idiopathic pulmonary fibrosis) ([place]), Skin lesion of left lower extremity, and Unintentional weight loss were also pertinent to this visit.  HPI JALEAHA HIGHT presents for follow up on ongoing weight loss and fatigue  She has lost 10 lbs since July 2020.  Appetite is not as good as it used to be but adequate  Bowels have been moving most mornings,  Sometimes  Has 3 to 4 daily solid smaller  Stools. Uses a stool softener every other day  Averages 3  8 ounce servings of water daily . Has lost taste for coffee and tea.   Feels anxious a lot ,especially if she feels short of breath   Saw [facility],   Chest x ray and ekg done.   Diagnosed and treated for pulmonary edema secondary to CHF and ARB 3 weeks ago  d/c'd due to cough .  Took lasix for 3 days which helped . Last echo mov 2019  Ef 50%  Same on stress test done in 2020 but only exercised for 5 minutes.   Has a skin cancer on left calf but was unable to get an appt with derm until August  With [facility]   BP has been fluctuating,  yesterday [id number] systolic ,  But labile up to 0000000 systolic .    b12 deficiency diagnosed July 2020   IF ab negative   Outpatient Medications Prior to Visit  Medication Sig Dispense Refill  . Bevacizumab (AVASTIN) 100 MG/4ML SOLN Inject into the vein.    Marland Kitchen diclofenac Sodium (VOLTAREN) 1 % GEL Apply 2 g topically 4 (four) times daily. Prn left shoulder 150 g 2  . EUTHYROX 50 MCG tablet TAKE 1 TABLET BY MOUTH ONCE DAILY (TAKE  30  MINUTES  PRIOR  TO  EATING) 90 tablet 0  . furosemide (LASIX) 20 MG tablet Take 1 tablet (20 mg total) by mouth daily. As needed for overnight  weight gain 30 tablet 1  . Rivaroxaban (XARELTO) 15 MG TABS tablet Take 1 tablet (15 mg total) by mouth daily. 90 tablet 2  . triamcinolone cream (KENALOG) 0.1 % Apply 1 application topically 2 (two) times daily. Prn upper back avoid face, privates, under arms 454 g 0   No facility-administered medications prior to visit.    Review of Systems;  Patient denies headache, fevers, malaise, unintentional weight loss, skin rash, eye pain, sinus congestion and sinus pain, sore throat, dysphagia,  hemoptysis , cough, dyspnea, wheezing, chest pain, palpitations, orthopnea, edema, abdominal pain, nausea, melena, diarrhea, constipation, flank pain, dysuria, hematuria, urinary  Frequency, nocturia, numbness, tingling, seizures,  Focal weakness, Loss of consciousness,  Tremor, insomnia, depression, anxiety, and suicidal ideation.      Objective:  BP (!) 172/70 (BP Location: Left Arm, Patient Position: Sitting, Cuff Size: Normal)   Pulse (!) 55   Temp (!) 96.5 F (35.8 C) (Temporal)   Resp 15   Ht 5\' 3"  (1.6 m)   Wt 105 lb (47.6 kg)   SpO2 95%   BMI 18.60 kg/m   BP Readings from Last 3 Encounters:  06/20/19 (!) 172/70  06/13/19 120/70  04/24/19 (!) 142/68    Wt Readings from Last 3 Encounters:  06/20/19 105  lb (47.6 kg)  06/13/19 105 lb 3.2 oz (47.7 kg)  04/24/19 110 lb 6.4 oz (50.1 kg)    General appearance: alert, cooperative and appears stated age Ears: normal TM's and external ear canals both ears Throat: lips, mucosa, and tongue normal; teeth and gums normal Neck: no adenopathy, no carotid bruit, supple, symmetrical, trachea midline and thyroid not enlarged, symmetric, no tenderness/mass/nodules Back: symmetric, no curvature. ROM normal. No CVA tenderness. Lungs: clear to auscultation bilaterally Heart: regular rate and rhythm, S1, S2 normal, no murmur, click, rub or gallop Abdomen: soft, non-tender; bowel sounds normal; no masses,  no organomegaly Pulses: 2+ and symmetric Skin:  Skin color, texture, turgor normal. No rashes or lesions Lymph nodes: Cervical, supraclavicular, and axillary nodes normal.  No results found for: HGBA1C  Lab Results  Component Value Date   CREATININE 1.10 06/20/2019   CREATININE 1.08 04/03/2019   CREATININE 1.20 09/11/2018    Lab Results  Component Value Date   WBC 4.6 06/20/2019   HGB 13.1 06/20/2019   HCT 40.6 06/20/2019   PLT 244.0 06/20/2019   GLUCOSE 81 06/20/2019   CHOL 208 (H) 08/31/2018   TRIG 94.0 08/31/2018   HDL 72.40 08/31/2018   LDLDIRECT 185.3 03/27/2013   LDLCALC 116 (H) 08/31/2018   ALT 37 (H) 06/20/2019   AST 36 06/20/2019   NA 139 06/20/2019   K 4.4 06/20/2019   CL 104 06/20/2019   CREATININE 1.10 06/20/2019   BUN 16 06/20/2019   CO2 28 06/20/2019   TSH 2.94 06/20/2019   INR 1.03 03/04/2016    MM 3D SCREEN BREAST BILATERAL  Result Date: 11/07/2018 CLINICAL DATA:  Screening. EXAM: DIGITAL SCREENING BILATERAL MAMMOGRAM WITH TOMO AND CAD COMPARISON:  Previous exam(s). ACR Breast Density Category b: There are scattered areas of fibroglandular density. FINDINGS: There are no findings suspicious for malignancy. Images were processed with CAD. IMPRESSION: No mammographic evidence of malignancy. A result letter of this screening mammogram will be mailed directly to the patient. RECOMMENDATION: Screening mammogram in one year. (Code:SM-B-01Y) BI-RADS CATEGORY  1: Negative. Electronically Signed   By: Ammie Ferrier M.D.   On: 11/07/2018 15:20    Assessment & Plan:   Problem List Items Addressed This Visit      Unprioritized   B12 deficiency    Intrinsic Factor antibody negative. Continue supplementation   Lab Results  Component Value Date   VITAMINB12 253 09/11/2018         Essential hypertension    No changes to regimen given lability      Generalized anxiety disorder    Will recommend trial of remeron and paxil to manage anorexia , fatigue and anxiety       Relevant Medications    mirtazapine (REMERON) 15 MG tablet   ALPRAZolam (XANAX) 0.25 MG tablet   Hypothyroidism due to acquired atrophy of thyroid    Thyroid function is WNL on current dose.  No current changes needed.   Lab Results  Component Value Date   TSH 2.94 06/20/2019         IPF (idiopathic pulmonary fibrosis) ([place])    Referring to pulmonology for management given new onset fatigue and weight loss.  Ambulatory  sats are normal      Skin lesion of left lower extremity    Skin cancer of calf , steadily enlarging.  Referral to dermatology moved up from August to June       RESOLVED: Skin neoplasm   Relevant Orders   Ambulatory referral  to Dermatology   Unintentional weight loss    Etiology unclear.  She denies abdominal pain but has chronic anxiety.  Other contributors include pulmonary fibrosis without desaturations.  Will start mirtazapine and if no improvement  Will recommend chest abd and pelvic CT to rule out occult malignancy       Other Visit Diagnoses    Anemia, unspecified type    -  Primary   Relevant Medications   cyanocobalamin ((VITAMIN B-12)) injection 1,000 mcg (Completed)   Other Relevant Orders   CBC with Differential/Platelet (Completed)   TSH (Completed)   Iron, TIBC and Ferritin Panel (Completed)   Fecal occult blood, imunochemical   Comprehensive metabolic panel (Completed)   Pulmonary fibrosis ([place])       Relevant Orders   Ambulatory referral to Pulmonology      I am having Blanch Media A. Poke start on mirtazapine. I am also having her maintain her Rivaroxaban, diclofenac Sodium, triamcinolone cream, furosemide, Euthyrox, Avastin, and ALPRAZolam. We administered cyanocobalamin.  Meds ordered this encounter  Medications  . mirtazapine (REMERON) 15 MG tablet    Sig: 1/2 to 1 tablet 30 minutes before bedtime daily    Dispense:  30 tablet    Refill:  2  . ALPRAZolam (XANAX) 0.25 MG tablet    Sig: Take 1 tablet (0.25 mg total) by mouth 2 (two) times daily as needed  for anxiety.    Dispense:  45 tablet    Refill:  0  . cyanocobalamin ((VITAMIN B-12)) injection 1,000 mcg    I provided  30 minutes of  face-to-face time during this encounter reviewing patient's current problems and past surgeries, labs and imaging studies, providing counseling on the above mentioned problems , and coordination  of care .  There are no discontinued medications.  Follow-up: Return in about 3 weeks (around 07/11/2019).   [name], MD

## 2019-06-21 LAB — IRON,TIBC AND FERRITIN PANEL
%SAT: 18 % (calc) (ref 16–45)
Ferritin: 32 ng/mL (ref 16–288)
Iron: 78 ug/dL (ref 45–160)
TIBC: 433 mcg/dL (calc) (ref 250–450)

## 2019-06-22 DIAGNOSIS — D492 Neoplasm of unspecified behavior of bone, soft tissue, and skin: Secondary | ICD-10-CM | POA: Insufficient documentation

## 2019-06-22 NOTE — Assessment & Plan Note (Signed)
No changes to regimen given lability

## 2019-06-22 NOTE — Assessment & Plan Note (Signed)
Referring to pulmonology for management given new onset fatigue and weight loss.  Ambulatory  sats are normal

## 2019-06-22 NOTE — Assessment & Plan Note (Signed)
Will recommend trial of remeron and paxil to manage anorexia , fatigue and anxiety

## 2019-06-22 NOTE — Assessment & Plan Note (Signed)
Intrinsic Factor antibody negative. Continue supplementation   Lab Results  Component Value Date   VITAMINB12 253 09/11/2018

## 2019-06-22 NOTE — Assessment & Plan Note (Addendum)
Skin cancer of calf , steadily enlarging.  Referral to dermatology moved up from August to June

## 2019-06-22 NOTE — Assessment & Plan Note (Signed)
Thyroid function is WNL on current dose.  No current changes needed.   Lab Results  Component Value Date   TSH 2.94 06/20/2019    

## 2019-06-22 NOTE — Assessment & Plan Note (Signed)
Etiology unclear.  She denies abdominal pain but has chronic anxiety.  Other contributors include pulmonary fibrosis without desaturations.  Will start mirtazapine and if no improvement  Will recommend chest abd and pelvic CT to rule out occult malignancy

## 2019-07-08 ENCOUNTER — Telehealth: Payer: Self-pay | Admitting: Internal Medicine

## 2019-07-08 NOTE — Telephone Encounter (Signed)
I will try again .  Is she willing to go to [place] or [place]?

## 2019-07-08 NOTE — Telephone Encounter (Signed)
Pt wants to know if Dr. Derrel Nip can get her in sooner with someone else for her skin lesion. Her appt with Dr. [name] is not until 08/07/19 and she thinks she needs to be seen sooner. Pt would like a call back.

## 2019-07-09 ENCOUNTER — Telehealth: Payer: Self-pay | Admitting: Internal Medicine

## 2019-07-09 DIAGNOSIS — L989 Disorder of the skin and subcutaneous tissue, unspecified: Secondary | ICD-10-CM

## 2019-07-09 DIAGNOSIS — H353221 Exudative age-related macular degeneration, left eye, with active choroidal neovascularization: Secondary | ICD-10-CM | POA: Diagnosis not present

## 2019-07-09 NOTE — Telephone Encounter (Signed)
Spoke with patient she has confirmed appointment for tomorrow with Dr. Kellie Moor their office has contacted patient .

## 2019-07-09 NOTE — Telephone Encounter (Signed)
I have placed a personal call to dr Kellie Moor and am waiting to hear back

## 2019-07-09 NOTE — Telephone Encounter (Signed)
Pt aware of appointment 

## 2019-07-09 NOTE — Telephone Encounter (Signed)
Dr. Kellie Moor is working her in as a favor to me,  And will see her  tomorrow at 2:15.  The  dermatology office will be calling her this morning to set up   [facility] let me know if I have to do a new derm referral

## 2019-07-09 NOTE — Telephone Encounter (Signed)
I tried to call patient at both numbers to let her know about appointment. She did not answer cell & no VM to LM. I called & spoke to granddaughter at home who said that she would have patient call back our office.

## 2019-07-09 NOTE — Telephone Encounter (Signed)
Patient since she doesn't drive prefers not to travel. She would really like to see Dr. Karle Barr. She stated she thought that is who you sees as well as her daughter.

## 2019-07-10 DIAGNOSIS — L814 Other melanin hyperpigmentation: Secondary | ICD-10-CM | POA: Diagnosis not present

## 2019-07-10 DIAGNOSIS — C44729 Squamous cell carcinoma of skin of left lower limb, including hip: Secondary | ICD-10-CM | POA: Diagnosis not present

## 2019-07-10 DIAGNOSIS — D485 Neoplasm of uncertain behavior of skin: Secondary | ICD-10-CM | POA: Diagnosis not present

## 2019-07-10 DIAGNOSIS — L538 Other specified erythematous conditions: Secondary | ICD-10-CM | POA: Diagnosis not present

## 2019-07-11 ENCOUNTER — Ambulatory Visit: Payer: Medicare HMO | Admitting: Internal Medicine

## 2019-07-11 DIAGNOSIS — I272 Pulmonary hypertension, unspecified: Secondary | ICD-10-CM | POA: Diagnosis not present

## 2019-07-11 DIAGNOSIS — I050 Rheumatic mitral stenosis: Secondary | ICD-10-CM | POA: Diagnosis not present

## 2019-07-11 DIAGNOSIS — E782 Mixed hyperlipidemia: Secondary | ICD-10-CM | POA: Diagnosis not present

## 2019-07-11 DIAGNOSIS — I10 Essential (primary) hypertension: Secondary | ICD-10-CM | POA: Diagnosis not present

## 2019-07-11 DIAGNOSIS — I071 Rheumatic tricuspid insufficiency: Secondary | ICD-10-CM | POA: Diagnosis not present

## 2019-07-11 DIAGNOSIS — R001 Bradycardia, unspecified: Secondary | ICD-10-CM | POA: Diagnosis not present

## 2019-07-11 DIAGNOSIS — I341 Nonrheumatic mitral (valve) prolapse: Secondary | ICD-10-CM | POA: Diagnosis not present

## 2019-07-11 DIAGNOSIS — I480 Paroxysmal atrial fibrillation: Secondary | ICD-10-CM | POA: Diagnosis not present

## 2019-07-11 DIAGNOSIS — I5020 Unspecified systolic (congestive) heart failure: Secondary | ICD-10-CM | POA: Diagnosis not present

## 2019-07-18 ENCOUNTER — Other Ambulatory Visit: Payer: Self-pay

## 2019-07-18 ENCOUNTER — Ambulatory Visit (INDEPENDENT_AMBULATORY_CARE_PROVIDER_SITE_OTHER): Payer: Medicare HMO | Admitting: Internal Medicine

## 2019-07-18 ENCOUNTER — Encounter: Payer: Self-pay | Admitting: Internal Medicine

## 2019-07-18 DIAGNOSIS — G4700 Insomnia, unspecified: Secondary | ICD-10-CM | POA: Diagnosis not present

## 2019-07-18 DIAGNOSIS — R5383 Other fatigue: Secondary | ICD-10-CM

## 2019-07-18 DIAGNOSIS — I700 Atherosclerosis of aorta: Secondary | ICD-10-CM

## 2019-07-18 DIAGNOSIS — E034 Atrophy of thyroid (acquired): Secondary | ICD-10-CM

## 2019-07-18 DIAGNOSIS — E538 Deficiency of other specified B group vitamins: Secondary | ICD-10-CM

## 2019-07-18 DIAGNOSIS — I5042 Chronic combined systolic (congestive) and diastolic (congestive) heart failure: Secondary | ICD-10-CM | POA: Diagnosis not present

## 2019-07-18 DIAGNOSIS — I10 Essential (primary) hypertension: Secondary | ICD-10-CM

## 2019-07-18 DIAGNOSIS — R634 Abnormal weight loss: Secondary | ICD-10-CM

## 2019-07-18 DIAGNOSIS — I4891 Unspecified atrial fibrillation: Secondary | ICD-10-CM | POA: Diagnosis not present

## 2019-07-18 DIAGNOSIS — F411 Generalized anxiety disorder: Secondary | ICD-10-CM

## 2019-07-18 NOTE — Patient Instructions (Signed)
Continue current medications  I would like you Consider allowing me to refer you to the Hospital's  Cardiopulmonary Rehab program to  help improve your endurance

## 2019-07-18 NOTE — Progress Notes (Signed)
Subjective:  Patient ID: Frances Kent, female    DOB: 1931/01/14  Age: 86 y.o. MRN: [id number]  CC: Diagnoses of Abdominal aortic atherosclerosis ([place]), B12 deficiency, Chronic combined systolic and diastolic congestive heart failure ([place]), Essential hypertension, Fatigue, unspecified type, Generalized anxiety disorder, Insomnia, unspecified type, and Unintentional weight loss were pertinent to this visit.  HPI Frances Kent presents for 1 month follow up on weight loss , unintentional.     Snce her last visit she has had cardiology , ophthalmology and dermatology.  1) weight loss:  She has  gained 1 lb since started using remeron .  Taking 1 tablet at bedtime and sleeping better. Appetite not improved yet .  But eating 4 smaller meals daily .   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  checked and back to normal..   3)  Saw cardiology last week,  No medication  Changes .  I have offered C/p rehab today she is considering it   4)  Macular degeneration now diagnosed in  right eye as well..  Receiving injections . Uses glasses.  Not needing magnification lenses   No history of falls.   5) Seen by Dr Kellie Moor for skin lesion on left  calf.  Biopsy was positive for skin cancer and full  excision is planned.  Keeping wound Covered and using vaseline     Outpatient Medications Prior to Visit  Medication Sig Dispense Refill  . ALPRAZolam (XANAX) 0.25 MG tablet Take 1 tablet (0.25 mg total) by mouth 2 (two) times daily as needed for anxiety. 45 tablet 0  . Bevacizumab (AVASTIN) 100 MG/4ML SOLN Inject into the vein.    Marland Kitchen diclofenac Sodium (VOLTAREN) 1 % GEL Apply 2 g topically 4 (four) times daily. Prn left shoulder 150 g 2  . EUTHYROX 50 MCG tablet TAKE 1 TABLET BY MOUTH ONCE DAILY (TAKE  30  MINUTES  PRIOR  TO  EATING) 90 tablet 0  . furosemide (LASIX) 20 MG tablet Take 1  tablet (20 mg total) by mouth daily. As needed for overnight weight gain 30 tablet 1  . mirtazapine (REMERON) 15 MG tablet 1/2 to 1 tablet 30 minutes before bedtime daily 30 tablet 2  . Rivaroxaban (XARELTO) 15 MG TABS tablet Take 1 tablet (15 mg total) by mouth daily. 90 tablet 2  . triamcinolone cream (KENALOG) 0.1 % Apply 1 application topically 2 (two) times daily. Prn upper back avoid face, privates, under arms 454 g 0   No facility-administered medications prior to visit.    Review of Systems;  Patient denies headache, fevers, malaise, unintentional weight loss, skin rash, eye pain, sinus congestion and sinus pain, sore throat, dysphagia,  hemoptysis , cough, dyspnea, wheezing, chest pain, palpitations, orthopnea, edema, abdominal pain, nausea, melena, diarrhea, constipation, flank pain, dysuria, hematuria, urinary  Frequency, nocturia, numbness, tingling, seizures,  Focal weakness, Loss of consciousness,  Tremor, insomnia, depression, anxiety, and suicidal ideation.      Objective:  BP (!) 160/82 (BP Location: Left Arm, Patient Position: Sitting, Cuff Size: Normal)   Pulse (!) 51   Temp (!) 96.6 F (35.9 C) (Temporal)   Resp 16   Ht 5\' 3"  (1.6 m)   Wt 106 lb 9.6 oz (48.4 kg)   SpO2 98%   BMI 18.88 kg/m   BP Readings from Last 3 Encounters:  07/18/19 Marland Kitchen)  160/82  06/20/19 (!) 172/70  06/13/19 120/70    Wt Readings from Last 3 Encounters:  07/18/19 106 lb 9.6 oz (48.4 kg)  06/20/19 105 lb (47.6 kg)  06/13/19 105 lb 3.2 oz (47.7 kg)    General appearance: alert, cooperative and appears stated age Ears: normal TM's and external ear canals both ears Throat: lips, mucosa, and tongue normal; teeth and gums normal Neck: no adenopathy, no carotid bruit, supple, symmetrical, trachea midline and thyroid not enlarged, symmetric, no tenderness/mass/nodules Back: symmetric, no curvature. ROM normal. No CVA tenderness. Lungs: clear to auscultation bilaterally Heart: regular rate  and rhythm, S1, S2 normal, no murmur, click, rub or gallop Abdomen: soft, non-tender; bowel sounds normal; no masses,  no organomegaly Pulses: 2+ and symmetric Skin:  Dime sized wound of posterior calf with slough and minimal erythema.  Skin color, texture, turgor normal. No rashes or lesions Lymph nodes: Cervical, supraclavicular, and axillary nodes normal. Psych: affect sad, makes good eye contact. No fidgeting,  Smiles easily.  Denies suicidal thoughts   No results found for: HGBA1C  Lab Results  Component Value Date   CREATININE 1.10 06/20/2019   CREATININE 1.08 04/03/2019   CREATININE 1.20 09/11/2018    Lab Results  Component Value Date   WBC 4.6 06/20/2019   HGB 13.1 06/20/2019   HCT 40.6 06/20/2019   PLT 244.0 06/20/2019   GLUCOSE 81 06/20/2019   CHOL 208 (H) 08/31/2018   TRIG 94.0 08/31/2018   HDL 72.40 08/31/2018   LDLDIRECT 185.3 03/27/2013   LDLCALC 116 (H) 08/31/2018   ALT 37 (H) 06/20/2019   AST 36 06/20/2019   NA 139 06/20/2019   K 4.4 06/20/2019   CL 104 06/20/2019   CREATININE 1.10 06/20/2019   BUN 16 06/20/2019   CO2 28 06/20/2019   TSH 2.94 06/20/2019   INR 1.03 03/04/2016    MM 3D SCREEN BREAST BILATERAL  Result Date: 11/07/2018 CLINICAL DATA:  Screening. EXAM: DIGITAL SCREENING BILATERAL MAMMOGRAM WITH TOMO AND CAD COMPARISON:  Previous exam(s). ACR Breast Density Category b: There are scattered areas of fibroglandular density. FINDINGS: There are no findings suspicious for malignancy. Images were processed with CAD. IMPRESSION: No mammographic evidence of malignancy. A result letter of this screening mammogram will be mailed directly to the patient. RECOMMENDATION: Screening mammogram in one year. (Code:[id number]-B-01Y) BI-RADS CATEGORY  1: Negative. Electronically Signed   By: Ammie Ferrier M.D.   On: 11/07/2018 15:20    Assessment & Plan:   Problem List Items Addressed This Visit      Unprioritized   Abdominal aortic atherosclerosis ([place])     Treatment with statin deferred by patient       B12 deficiency    Intrinsic Factor antibody negative. Continue oral supplementation ,  Recheck level at next draw given depression   Lab Results  Component Value Date   VITAMINB12 253 09/11/2018         CHF (congestive heart failure) ([place])    Presumed non ischemic by 2020 myoview, with mild LVH , puilmonary hypertension and mild mitral valve regurgitation. Advised patient to continue to monitor wt daily and double the lasix dose for overnight weight gain of 2 lbs or weekly weight gain of 5 lbs.       Essential hypertension     Controlled without losartan which was stopped by cardiology in April due to cough       Fatigue    Multifactorial , with complicated grief,  atrial fibrillation,  Systolic dysfunction  and deconditioning contributing.  Recommended cardiopulmonary rehab  She is contemplative.  consider increasing dose of remeron at next visit       Generalized anxiety disorder    She did not tolerate trial of paxil but prefers to use alprazolam prn.  Continue trial of remeron. The risks and benefits of benzodiazepine use were reviewed with patient today including excessive sedation leading to respiratory depression,  impaired thinking/driving, and addiction.  Patient was advised to avoid concurrent use with alcohol, to use medication only as needed and not to share with others  .       Insomnia    remeron trial helping. No changes today . Taking full tablet       Unintentional weight loss    Etiology unclear.  She denies abdominal pain but has chronic anxiety.  Other contributors include pulmonary fibrosis without desaturations.  Weight as stabilized with initiation of  Mirtazapine so far.  if it recurs. will recommend chest abd and pelvic CT to rule out occult malignancy         I provided  30 minutes of  face-to-face time during this encounter reviewing patient's current problems and past surgeries, labs and imaging  studies, providing counseling on the above mentioned problems , and coordination  of care .  I am having Blanch Media A. Tokarz maintain her Rivaroxaban, diclofenac Sodium, triamcinolone cream, furosemide, Euthyrox, Avastin, mirtazapine, and ALPRAZolam.  No orders of the defined types were placed in this encounter.   There are no discontinued medications.  Follow-up: No follow-ups on file.   [name], MD

## 2019-07-20 NOTE — Assessment & Plan Note (Signed)
Treatment with statin deferred by patient

## 2019-07-20 NOTE — Assessment & Plan Note (Signed)
Controlled without losartan which was stopped by cardiology in April due to cough

## 2019-07-20 NOTE — Assessment & Plan Note (Signed)
Etiology unclear.  She denies abdominal pain but has chronic anxiety.  Other contributors include pulmonary fibrosis without desaturations.  Weight as stabilized with initiation of  Mirtazapine so far.  if it recurs. will recommend chest abd and pelvic CT to rule out occult malignancy

## 2019-07-20 NOTE — Assessment & Plan Note (Addendum)
Intrinsic Factor antibody negative. Continue oral supplementation ,  Recheck level at next draw given depression   Lab Results  Component Value Date   VITAMINB12 253 09/11/2018    

## 2019-07-20 NOTE — Assessment & Plan Note (Signed)
Presumed non ischemic by 2020 myoview, with mild LVH , puilmonary hypertension and mild mitral valve regurgitation. Advised patient to continue to monitor wt daily and double the lasix dose for overnight weight gain of 2 lbs or weekly weight gain of 5 lbs.  

## 2019-07-20 NOTE — Assessment & Plan Note (Signed)
remeron trial helping. No changes today . Taking full tablet

## 2019-07-20 NOTE — Assessment & Plan Note (Addendum)
Has been in sinus rhythm during follow up post cardioversion.  Continue current regimen of Xarelto,  No rate controlling agents pr antiarrhythmics needed [name]).

## 2019-07-20 NOTE — Assessment & Plan Note (Addendum)
Multifactorial , with complicated grief,  atrial fibrillation,  Systolic dysfunction and deconditioning contributing.  Recommended cardiopulmonary rehab  She is contemplative.  consider increasing dose of remeron at next visit

## 2019-07-20 NOTE — Assessment & Plan Note (Signed)
Thyroid function is WNL on current dose.  No current changes needed.   Lab Results  Component Value Date   TSH 2.94 06/20/2019

## 2019-07-20 NOTE — Assessment & Plan Note (Signed)
She did not tolerate trial of paxil but prefers to use alprazolam prn.  Continue trial of remeron. The risks and benefits of benzodiazepine use were reviewed with patient today including excessive sedation leading to respiratory depression,  impaired thinking/driving, and addiction.  Patient was advised to avoid concurrent use with alcohol, to use medication only as needed and not to share with others  .

## 2019-07-22 DIAGNOSIS — H353212 Exudative age-related macular degeneration, right eye, with inactive choroidal neovascularization: Secondary | ICD-10-CM | POA: Diagnosis not present

## 2019-07-25 DIAGNOSIS — C44729 Squamous cell carcinoma of skin of left lower limb, including hip: Secondary | ICD-10-CM | POA: Diagnosis not present

## 2019-08-07 ENCOUNTER — Ambulatory Visit: Payer: Medicare HMO | Admitting: Dermatology

## 2019-08-14 ENCOUNTER — Encounter: Payer: Self-pay | Admitting: Pulmonary Disease

## 2019-08-14 ENCOUNTER — Other Ambulatory Visit: Payer: Self-pay

## 2019-08-14 ENCOUNTER — Ambulatory Visit: Payer: Medicare HMO | Admitting: Pulmonary Disease

## 2019-08-14 VITALS — BP 124/72 | HR 60 | Temp 97.9°F | Resp 20 | Ht 63.0 in | Wt 107.0 lb

## 2019-08-14 DIAGNOSIS — I272 Pulmonary hypertension, unspecified: Secondary | ICD-10-CM

## 2019-08-14 DIAGNOSIS — I342 Nonrheumatic mitral (valve) stenosis: Secondary | ICD-10-CM | POA: Diagnosis not present

## 2019-08-14 DIAGNOSIS — I480 Paroxysmal atrial fibrillation: Secondary | ICD-10-CM | POA: Diagnosis not present

## 2019-08-14 DIAGNOSIS — Z86711 Personal history of pulmonary embolism: Secondary | ICD-10-CM

## 2019-08-14 DIAGNOSIS — I5020 Unspecified systolic (congestive) heart failure: Secondary | ICD-10-CM

## 2019-08-14 NOTE — Patient Instructions (Signed)
What we discussed today:   I do not see evidence of lung scarring (fibrosis) I think prior findings were related to fluid in the lungs which now it is being controlled for you  You have increased pressure in the artery going from the heart to the lungs this is likely due because of the narrowing on a valve in your heart called the mitral valve  We will check oxygen levels at nighttime to make sure they are not going low, if they do show to be low oxygen may help relax your heart and help with your congestive heart failure and atrial fibrillation issues  Consider taking magnesium supplement such as magnesium glycinate IF both Dr. Derrel Nip and Dr. [name] agree  I will see you in follow-up in 3 months time

## 2019-08-14 NOTE — Progress Notes (Signed)
Subjective:    Patient ID: Frances Kent, female    DOB: October 15, 1930, 86 y.o.   MRN: [id number]  HPI Is a delightful 86 year old lifelong never smoker who presents for evaluation of his of breath and fatigue, she is kindly referred by Dr. Deborra Medina.  Dr. Derrel Nip has also raised the question of potential pulmonary fibrosis.  The patient states that she actually has had some issues since 2021 heart problems were detected.  She is followed by Dr. [name] for these issues.  She has had atrial fibrillation, a small pulmonary embolus and is currently on anticoagulant therapy for this.  Pulmonary edema on prior films.  She states that after she was treated with Lasix her dyspnea improved dramatically she still feels fatigued.   I have reviewed her echocardiograms and the patient does have issues with mitral stenosis and pulmonary hypertension.  I suspect the pulmonary hypertension is due to mitral disease.  I have reviewed all of her imaging and there is no evidence of pulmonary fibrosis.  There was an erroneous entry on her medical history of pulmonary fibrosis but I see no evidence that she has had prior pulmonary evaluation or this diagnosis confirmed.  I suspect that the past medical history entry was erroneous.  Patient has had no fevers, chills or sweats no cough or sputum production no hemoptysis.  She does not describe orthopnea or paroxysmal nocturnal dyspnea.  No lower extremity edema.  She does note palpitations on occasion.  She does notice fatigue but no shortness of breath per se.  She is actually able to perform all of her activities of daily living.  Review of Systems A 10 point review of systems was performed and it is as noted above otherwise negative.  Past Medical History:  Diagnosis Date  . Atrial fibrillation ([place])   . GERD (gastroesophageal reflux disease)   . Mitral regurgitation   . Pulmonary embolism ([place]) 02/2016   Past Surgical History:  Procedure Laterality Date  .  APPENDECTOMY  1960  . BREAST CYST ASPIRATION Left 2017  . CARDIOVERSION N/A 04/05/2016   Procedure: Cardioversion;  Surgeon: [name], MD;  Location: [facility] ORS;  Service: Cardiovascular;  Laterality: N/A;  . CHOLECYSTECTOMY  1985  . OVARIAN CYST REMOVAL    . TEE WITHOUT CARDIOVERSION N/A 02/01/2018   Procedure: TRANSESOPHAGEAL ECHOCARDIOGRAM (TEE);  Surgeon: [name], MD;  Location: [facility] ORS;  Service: Cardiovascular;  Laterality: N/A;   Family History  Problem Relation Age of Onset  . Cancer Mother 72       breast cancer, lived to 54,   . Breast cancer Mother 97  . Cancer Son 40       pancreatic cancer  . Heart disease Son        CAD, Tobacco Abuse   . Heart disease Maternal Grandmother 24       died of massive MI  . Stroke Sister    Social History   Tobacco Use  . Smoking status: Never Smoker  . Smokeless tobacco: Never Used  . Tobacco comment: 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 , worked at [employer], [employer] Use Topics  . Alcohol use: No   No military history, has lived in [place] all her life.  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   No Known Allergies  Current Meds  Medication Sig  . ALPRAZolam (XANAX) 0.25 MG tablet Take 1 tablet (0.25 mg total) by  mouth 2 (two) times daily as needed for anxiety.  . EUTHYROX 50 MCG tablet TAKE 1 TABLET BY MOUTH ONCE DAILY (TAKE  30  MINUTES  PRIOR  TO  EATING)  . furosemide (LASIX) 20 MG tablet Take 1 tablet (20 mg total) by mouth daily. As needed for overnight weight gain  . mirtazapine (REMERON) 15 MG tablet 1/2 to 1 tablet 30 minutes before bedtime daily  . Rivaroxaban (XARELTO) 15 MG TABS tablet Take 1 tablet (15 mg total) by mouth daily.  Marland Kitchen triamcinolone cream (KENALOG) 0.1 % Apply 1 application topically 2 (two) times daily. Prn upper back avoid face, privates, under arms   Immunization History  Administered Date(s) Administered  .  Pneumococcal Conjugate-13 12/11/2014  . Pneumococcal Polysaccharide-23 07/13/2016   Declines Covid vaccine     Objective:   Physical Exam BP 124/72   Pulse 60   Temp 97.9 F (36.6 C)   Resp 20   Ht 5\' 3"  (1.6 m)   Wt 107 lb (48.5 kg)   SpO2 99%   BMI 18.95 kg/m  GENERAL: Thin well-developed elderly woman in no acute distress.  She is fully ambulatory. HEAD: Normocephalic, atraumatic.  EYES: Pupils equal, round, reactive to light.  No scleral icterus.  Mild periorbital edema. MOUTH: Masking requirements. NECK: Supple. No thyromegaly. Trachea midline. No JVD.  No adenopathy. PULMONARY: Good air entry bilaterally.  Lungs clear to auscultation bilaterally. CARDIOVASCULAR: S1 and S2. Regular rate and rhythm.  Mildly accentuated P2.  No other rubs murmurs gallops heard. GASTROINTESTINAL: Scaphoid abdomen.  No distention. MUSCULOSKELETAL: No joint deformity, no clubbing, no edema.  NEUROLOGIC: No focal deficit noted.  No gait disturbance on ambulation.  Speech is fluent. SKIN: Intact,warm,dry.  She has multiple ecchymoses particularly on the upper extremities. PSYCH: Mood and behavior normal.       Assessment & Plan:     ICD-10-CM   1. Pulmonary HTN (HCC)  I27.20 Pulse oximetry, overnight   This is likely due to mitral disease Will obtain overnight oximetry to exclude nocturnal oxygen desaturation   2. Nonrheumatic mitral valve stenosis  I34.2    Moderate mitral stenosis noted Complicated by MVP as well Likely etiology of pulmonary hypertension  3. Paroxysmal atrial fibrillation (HCC)  I48.0    Status post cardioversion in the past This issue adds complexity to her management Followed by cardiology  4. History of pulmonary embolism  Z86.711    On anticoagulation for A. fib Had small PE in the past Negative hypercoagulable work-up  5. Systolic congestive heart failure, unspecified HF chronicity (HCC)  I50.20    LVEF 40 to 45% Past CT findings likely secondary to volume  overload last CHF   Orders Placed This Encounter  Procedures  . Pulse oximetry, overnight    On room air    Standing Status:   Future    Standing Expiration Date:   08/13/2020   Discussion:  Patient had issues with dyspnea when she had a episode of decompensation of congestive heart failure.  I do not see evidence of pulmonary fibrosis.  She does have issues with pulmonary hypertension likely related to her mitral disease.  It is worthwhile checking an overnight oximetry to ensure that she is not desaturating at night as this could be detrimental given her cardiac issues.  We will see her in follow-up in 3 months time.  We will notify of the results of the overnight oximetry.  Of note I did delve into her chart I did not see  any prior work-up showing that the patient had pulmonary fibrosis and she does not recall such work-up previously.  She has never seen Dr. Chancy Milroy to her recollection.  I reviewed both our epic records and "Care Everywhere at.  Therefore, I cleared her past history from this erroneous diagnosis.  As an aside, she is currently on Xarelto for paroxysmal atrial fibrillation.  She has a history of pulmonary embolism in the past that was unprovoked.  No etiology for that was noted.  She is elderly with CKD IIIb would consider switching her to Eliquis but will defer this determination to Dr. Derrel Nip and Dr. [name].  I will see the patient back in 3 months time.  She is to contact us prior to that time should any issues with dyspnea surface in the interim.  Total time direct and indirect care 65 minutes.   Renold Don, MD [facility]   *This note was dictated using voice recognition software/Dragon.  Despite best efforts to proofread, errors can occur which can change the meaning.  Any change was purely unintentional.

## 2019-08-27 DIAGNOSIS — H353212 Exudative age-related macular degeneration, right eye, with inactive choroidal neovascularization: Secondary | ICD-10-CM | POA: Diagnosis not present

## 2019-08-27 DIAGNOSIS — H353221 Exudative age-related macular degeneration, left eye, with active choroidal neovascularization: Secondary | ICD-10-CM | POA: Diagnosis not present

## 2019-09-05 DIAGNOSIS — J449 Chronic obstructive pulmonary disease, unspecified: Secondary | ICD-10-CM | POA: Diagnosis not present

## 2019-09-06 ENCOUNTER — Telehealth: Payer: Self-pay

## 2019-09-06 NOTE — Telephone Encounter (Signed)
Pt is aware of results and voiced her understanding. Nothing further is needed.  

## 2019-09-06 NOTE — Telephone Encounter (Signed)
ONO has been reviewed by Dr. Patsey Berthold- no need for O2.  Lm to make pt aware.

## 2019-09-07 ENCOUNTER — Other Ambulatory Visit: Payer: Self-pay | Admitting: Internal Medicine

## 2019-09-10 DIAGNOSIS — H353221 Exudative age-related macular degeneration, left eye, with active choroidal neovascularization: Secondary | ICD-10-CM | POA: Diagnosis not present

## 2019-09-11 ENCOUNTER — Ambulatory Visit (INDEPENDENT_AMBULATORY_CARE_PROVIDER_SITE_OTHER): Payer: Medicare HMO

## 2019-09-11 ENCOUNTER — Ambulatory Visit: Payer: Medicare HMO

## 2019-09-11 ENCOUNTER — Other Ambulatory Visit: Payer: Self-pay

## 2019-09-11 ENCOUNTER — Encounter: Payer: Self-pay | Admitting: Internal Medicine

## 2019-09-11 ENCOUNTER — Ambulatory Visit (INDEPENDENT_AMBULATORY_CARE_PROVIDER_SITE_OTHER): Payer: Medicare HMO | Admitting: Internal Medicine

## 2019-09-11 VITALS — Ht 63.0 in | Wt 107.0 lb

## 2019-09-11 DIAGNOSIS — R634 Abnormal weight loss: Secondary | ICD-10-CM | POA: Diagnosis not present

## 2019-09-11 DIAGNOSIS — N1831 Chronic kidney disease, stage 3a: Secondary | ICD-10-CM | POA: Diagnosis not present

## 2019-09-11 DIAGNOSIS — M791 Myalgia, unspecified site: Secondary | ICD-10-CM | POA: Diagnosis not present

## 2019-09-11 DIAGNOSIS — K591 Functional diarrhea: Secondary | ICD-10-CM

## 2019-09-11 DIAGNOSIS — R5382 Chronic fatigue, unspecified: Secondary | ICD-10-CM | POA: Diagnosis not present

## 2019-09-11 DIAGNOSIS — Z0000 Encounter for general adult medical examination without abnormal findings: Secondary | ICD-10-CM | POA: Diagnosis not present

## 2019-09-11 DIAGNOSIS — Z86711 Personal history of pulmonary embolism: Secondary | ICD-10-CM | POA: Diagnosis not present

## 2019-09-11 DIAGNOSIS — Z1211 Encounter for screening for malignant neoplasm of colon: Secondary | ICD-10-CM | POA: Diagnosis not present

## 2019-09-11 DIAGNOSIS — T466X5A Adverse effect of antihyperlipidemic and antiarteriosclerotic drugs, initial encounter: Secondary | ICD-10-CM

## 2019-09-11 DIAGNOSIS — E785 Hyperlipidemia, unspecified: Secondary | ICD-10-CM | POA: Diagnosis not present

## 2019-09-11 NOTE — Patient Instructions (Addendum)
You do not have cancer!   You have a heart condition that makes you more tired after exerting yourself, so you need some regular exercise to improve your endurance.  I recommend starting a daily walk with your granddaughter In  a stroller on a flat track (no hills!)  . Start with just 10 minutes daily  And GRADUALLY increase your walk to 30 minutes over the next 6 months   Your diarrhea is so infrequent that it may be due to something you ate  Keep a FOOD DIARY for each occurrence of loose bowels and bring it to your next appointment in 3 months

## 2019-09-11 NOTE — Progress Notes (Addendum)
Subjective:   Frances Kent is a 86 y.o. female who presents for Medicare Annual (Subsequent) preventive examination.  Review of Systems    No ROS.  Medicare Wellness Virtual Visit.     Cardiac Risk Factors include: advanced age (>23men, >63 women);hypertension     Objective:    Today's Vitals   09/11/19 0902  Weight: 107 lb (48.5 kg)  Height: 5\' 3"  (1.6 m)   Body mass index is 18.95 kg/m.  Advanced Directives 09/11/2019 09/10/2018 08/15/2018 08/15/2018 02/25/2018 02/01/2018 06/15/2017  Does Patient Have a Medical Advance Directive? Yes Yes Yes No Yes No Yes  Type of Paramedic of [place];Living will [place];Living will [place];Living will  Does patient want to make changes to medical advance directive? No - Patient declined No - Patient declined No - Patient declined - - - No - Patient declined  Copy of [place] in Chart? No - copy requested No - copy requested No - copy requested - - - No - copy requested  Would patient like information on creating a medical advance directive? - - - No - Patient declined - No - Patient declined -    Current Medications (verified) Outpatient Encounter Medications as of 09/11/2019  Medication Sig   ALPRAZolam (XANAX) 0.25 MG tablet Take 1 tablet (0.25 mg total) by mouth 2 (two) times daily as needed for anxiety.   EUTHYROX 50 MCG tablet TAKE 1 TABLET BY MOUTH ONCE DAILY TAKE  30  MINUTES  PRIOR  TO  EATING   furosemide (LASIX) 20 MG tablet Take 1 tablet (20 mg total) by mouth daily. As needed for overnight weight gain   mirtazapine (REMERON) 15 MG tablet 1/2 to 1 tablet 30 minutes before bedtime daily   Rivaroxaban (XARELTO) 15 MG TABS tablet Take 1 tablet (15 mg total) by mouth daily.   triamcinolone cream (KENALOG) 0.1 % Apply 1 application topically 2 (two) times daily. Prn upper back avoid face, privates, under arms   No  facility-administered encounter medications on file as of 09/11/2019.    Allergies (verified) Patient has no known allergies.   History: Past Medical History:  Diagnosis Date   Atrial fibrillation ([place])    GERD (gastroesophageal reflux disease)    Mitral regurgitation    Pulmonary embolism ([place]) 02/2016   Past Surgical History:  Procedure Laterality Date   APPENDECTOMY  1960   BREAST CYST ASPIRATION Left 2017   CARDIOVERSION N/A 04/05/2016   Procedure: Cardioversion;  Surgeon: [name], MD;  Location: [facility] ORS;  Service: Cardiovascular;  Laterality: N/A;   [place]     TEE WITHOUT CARDIOVERSION N/A 02/01/2018   Procedure: TRANSESOPHAGEAL ECHOCARDIOGRAM (TEE);  Surgeon: [name], MD;  Location: [facility] ORS;  Service: Cardiovascular;  Laterality: N/A;   Family History  Problem Relation Age of Onset   Cancer Mother 62       breast cancer, lived to 69,    Breast cancer Mother 24   Cancer Son 37       pancreatic cancer   Heart disease Son        CAD, Tobacco Abuse    Heart disease Maternal Grandmother 80       died of massive MI   Stroke Sister    Social History   Socioeconomic History   Marital status: Widowed    Spouse name: Not  on file   Number of children: Not on file   Years of education: Not on file   Highest education level: Not on file  Occupational History   Not on file  Tobacco Use   Smoking status: Never Smoker   Smokeless tobacco: Never Used   Tobacco comment: 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 , worked at [employer], Development worker, 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 used  Substance and Sexual Activity   Alcohol use: No   Drug use: No   Sexual activity: Not Currently  Other Topics Concern   Not on file  Social History Narrative   Has caretaking responsibility for 3 young grandchildren who live with her   Social Determinants of Health   Financial Resource Strain:    Difficulty of Paying Living Expenses:   Food Insecurity:     Worried About Charity fundraiser in the Last Year:    Arboriculturist in the Last Year:   Transportation Needs: No Transportation Needs   Lack of Transportation (Medical): No   Lack of Transportation (Non-Medical): No  Physical Activity:    Days of Exercise per Week:    Minutes of Exercise per Session:   Stress: No Stress Concern Present   Feeling of Stress : Not at all  Social Connections:    Frequency of Communication with Friends and Family:    Frequency of Social Gatherings with Friends and Family:    Attends Religious Services:    Active Member of Clubs or Organizations:    Attends Music therapist:    Marital Status:     Tobacco Counseling Counseling given: Not Answered Comment: 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 , worked at [employer], [employer] Intake:  Pre-visit preparation completed: Yes        Diabetes: No  How often do you need to have someone help you when you read instructions, pamphlets, or other written materials from your doctor or pharmacy?: 1 - Never   Interpreter Needed?: No      Activities of Daily Living In your present state of health, do you have any difficulty performing the following activities: 09/11/2019  Hearing? N  Vision? N  Difficulty concentrating or making decisions? N  Walking or climbing stairs? N  Dressing or bathing? N  Preparing Food and eating ? N  Using the Toilet? N  In the past six months, have you accidently leaked urine? N  Comment Self managed  Do you have problems with loss of bowel control? N  Managing your Medications? N  Managing your Finances? N  Housekeeping or managing your Housekeeping? N  Some recent data might be hidden    Patient Care Team: [name], MD as PCP - General (Internal Medicine) Bary Castilla, Forest Gleason, MD (General Surgery) [name], MD (Internal Medicine)  Indicate any recent Medical Services you may have received from other than [facility] providers in the past year  (date may be approximate).     Assessment:   This is a routine wellness examination for Frances Kent.  I connected with [name] today by telephone and verified that I am speaking with the correct person using two identifiers. Location patient: home Location provider: work Persons participating in the virtual visit: patient, Marine scientist.    I discussed the limitations, risks, security and privacy concerns of performing an evaluation and management service by telephone and the availability of in person appointments. I also discussed with the patient that there may be a patient responsible charge related  to this service. The patient expressed understanding and verbally consented to this telephonic visit.    Interactive audio and video telecommunications were attempted between this provider and patient, however failed, due to patient having technical difficulties OR patient did not have access to video capability.  We continued and completed visit with audio only.  Some vital signs may be absent or patient reported.   Hearing/Vision screen  Hearing Screening   125Hz  250Hz  500Hz  1000Hz  2000Hz  3000Hz  4000Hz  6000Hz  8000Hz   Right ear:           Left ear:           Comments: Patient is able to hear conversational tones without difficulty.  No issues reported.   Vision Screening Comments: Followed by [facility] Wears reader lenses Macular degeneration; injection L eye every 9 weeks, R eye every 6 weeks Visual acuity not assessed, virtual visit.  They have seen their ophthalmologist in the last 12 months.    Dietary issues and exercise activities discussed: Current Exercise Habits: Home exercise routine, Intensity: Mild  Goals      Healthy Lifestyle     Stay hydrated and drink plenty of fluids. Low carb foods.  Lean meats and vegetables. Stay active and walk for exercise as tolerated.       Depression Screen PHQ 2/9 Scores 09/11/2019 04/11/2019 09/10/2018 06/15/2017 06/14/2016 04/08/2016  11/23/2015  PHQ - 2 Score 0 0 0 0 0 0 0  PHQ- 9 Score - - - - 0 - -    Fall Risk Fall Risk  07/18/2019 06/20/2019 06/13/2019 04/24/2019 04/11/2019  Falls in the past year? 0 0 0 0 0  Number falls in past yr: - - - - 0  Injury with Fall? - - - - 0  Follow up Falls evaluation completed Falls evaluation completed Falls evaluation completed Falls evaluation completed Falls evaluation completed    Handrails in use when climbing stairs? Yes  Home free of loose throw rugs in walkways, pet beds, electrical cords, etc? Yes  Adequate lighting in your home to reduce risk of falls? Yes   ASSISTIVE DEVICES UTILIZED TO PREVENT FALLS: Use of a cane, walker or w/c? No  Grab bars in the bathroom? Yes  Shower chair or bench in shower? Yes  Elevated toilet seat or a handicapped toilet? No   TIMED UP AND GO:  Was the test performed? No . Virtual visit.   Cognitive Function: Patient is alert and oriented x3.  Denies difficulty focusing, making decisions, memory loss.   MMSE - Mini Mental State Exam 09/11/2019 06/14/2016 06/15/2015  Not completed: Unable to complete - -  Orientation to time - 5 5  Orientation to Place - 5 5  Registration - 3 3  Attention/ Calculation - 5 5  Recall - 3 3  Language- name 2 objects - 2 2  Language- repeat - 1 1  Language- follow 3 step command - 3 3  Language- read & follow direction - 1 1  Write a sentence - 1 1  Copy design - 1 1  Total score - 30 30     6CIT Screen 09/10/2018 06/15/2017  What Year? 0 points 0 points  What month? 0 points 0 points  What time? 0 points 0 points  Count back from 20 0 points 0 points  Months in reverse 0 points 0 points  Repeat phrase 0 points 0 points  Total Score 0 0    Immunizations Immunization History  Administered Date(s) Administered  Pneumococcal Conjugate-13 12/11/2014   Pneumococcal Polysaccharide-23 07/13/2016   Health Maintenance Health Maintenance  Topic Date Due   COVID-19 Vaccine (1) 09/27/2019 (Originally  08/30/1942)   TETANUS/TDAP  09/10/2020 (Originally 08/31/1949)   INFLUENZA VACCINE  09/15/2019   DEXA SCAN  Completed   PNA vac Low Risk Adult  Completed   Covid vaccine- declined.  Dental Screening: Recommended annual dental exams for proper oral hygiene. Dentures; upper plate. Visits every 6-12 months.   Community Resource Referral / Chronic Care Management: CRR required this visit?  No   CCM required this visit?  No      Plan:    Keep all routine maintenance appointments.   Follow up with your doctor in office today @ 9:30  I have personally reviewed and noted the following in the patient's chart:   Medical and social history Use of alcohol, tobacco or illicit drugs  Current medications and supplements Functional ability and status Nutritional status Physical activity Advanced directives List of other physicians Hospitalizations, surgeries, and ER visits in previous 12 months Vitals Screenings to include cognitive, depression, and falls Referrals and appointments  In addition, I have reviewed and discussed with patient certain preventive protocols, quality metrics, and best practice recommendations. A written personalized care plan for preventive services as well as general preventive health recommendations were provided to patient via mail.     OBrien-Blaney, Natilie Krabbenhoft L, LPN   [id number]    I have reviewed the above information and agree with above.   Deborra Medina, MD

## 2019-09-11 NOTE — Patient Instructions (Addendum)
Frances Kent , Thank you for taking time to come for your Medicare Wellness Visit. I appreciate your ongoing commitment to your health goals. Please review the following plan we discussed and let me know if I can assist you in the future.   These are the goals we discussed: Goals    . Healthy Lifestyle     Stay hydrated and drink plenty of fluids. Low carb foods.  Lean meats and vegetables. Stay active and walk for exercise as tolerated.       This is a list of the screening recommended for you and due dates:  Health Maintenance  Topic Date Due  . COVID-19 Vaccine (1) 09/27/2019*  . Tetanus Vaccine  09/10/2020*  . Flu Shot  09/15/2019  . DEXA scan (bone density measurement)  Completed  . Pneumonia vaccines  Completed  *Topic was postponed. The date shown is not the original due date.    Immunizations Immunization History  Administered Date(s) Administered  . Pneumococcal Conjugate-13 12/11/2014  . Pneumococcal Polysaccharide-23 07/13/2016   Keep all routine maintenance appointments.   Follow up with your doctor in office today @ 9:30  Advanced directives: End of life planning; Advance aging; Advanced directives discussed.  Copy of current HCPOA/Living Will requested.    Conditions/risks identified: none new  Follow up in one year for your annual wellness visit    Preventive Care 65 Years and Older, Female Preventive care refers to lifestyle choices and visits with your health care provider that can promote health and wellness. What does preventive care include?  A yearly physical exam. This is also called an annual well check.  Dental exams once or twice a year.  Routine eye exams. Ask your health care provider how often you should have your eyes checked.  Personal lifestyle choices, including:  Daily care of your teeth and gums.  Regular physical activity.  Eating a healthy diet.  Avoiding tobacco and drug use.  Limiting alcohol use.  Practicing safe  sex.  Taking low-dose aspirin every day.  Taking vitamin and mineral supplements as recommended by your health care provider. What happens during an annual well check? The services and screenings done by your health care provider during your annual well check will depend on your age, overall health, lifestyle risk factors, and family history of disease. Counseling  Your health care provider may ask you questions about your:  Alcohol use.  Tobacco use.  Drug use.  Emotional well-being.  Home and relationship well-being.  Sexual activity.  Eating habits.  History of falls.  Memory and ability to understand (cognition).  Work and work Statistician.  Reproductive health. Screening  You may have the following tests or measurements:  Height, weight, and BMI.  Blood pressure.  Lipid and cholesterol levels. These may be checked every 5 years, or more frequently if you are over 7 years old.  Skin check.  Lung cancer screening. You may have this screening every year starting at age 63 if you have a 30-pack-year history of smoking and currently smoke or have quit within the past 15 years.  Fecal occult blood test (FOBT) of the stool. You may have this test every year starting at age 87.  Flexible sigmoidoscopy or colonoscopy. You may have a sigmoidoscopy every 5 years or a colonoscopy every 10 years starting at age 40.  Hepatitis C blood test.  Hepatitis B blood test.  Sexually transmitted disease (STD) testing.  Diabetes screening. This is done by checking your blood sugar (glucose)  after you have not eaten for a while (fasting). You may have this done every 1-3 years.  Bone density scan. This is done to screen for osteoporosis. You may have this done starting at age 94.  Mammogram. This may be done every 1-2 years. Talk to your health care provider about how often you should have regular mammograms. Talk with your health care provider about your test results,  treatment options, and if necessary, the need for more tests. Vaccines  Your health care provider may recommend certain vaccines, such as:  Influenza vaccine. This is recommended every year.  Tetanus, diphtheria, and acellular pertussis (Tdap, Td) vaccine. You may need a Td booster every 10 years.  Zoster vaccine. You may need this after age 5.  Pneumococcal 13-valent conjugate (PCV13) vaccine. One dose is recommended after age 69.  Pneumococcal polysaccharide (PPSV23) vaccine. One dose is recommended after age 42. Talk to your health care provider about which screenings and vaccines you need and how often you need them. This information is not intended to replace advice given to you by your health care provider. Make sure you discuss any questions you have with your health care provider. Document Released: 02/27/2015 Document Revised: 10/21/2015 Document Reviewed: 12/02/2014 Elsevier Interactive Patient Education  2017 [place] Prevention in the Home Falls can cause injuries. They can happen to people of all ages. There are many things you can do to make your home safe and to help prevent falls. What can I do on the outside of my home?  Regularly fix the edges of walkways and driveways and fix any cracks.  Remove anything that might make you trip as you walk through a door, such as a raised step or threshold.  Trim any bushes or trees on the path to your home.  Use bright outdoor lighting.  Clear any walking paths of anything that might make someone trip, such as rocks or tools.  Regularly check to see if handrails are loose or broken. Make sure that both sides of any steps have handrails.  Any raised decks and porches should have guardrails on the edges.  Have any leaves, snow, or ice cleared regularly.  Use sand or salt on walking paths during winter.  Clean up any spills in your garage right away. This includes oil or grease spills. What can I do in the  bathroom?  Use night lights.  Install grab bars by the toilet and in the tub and shower. Do not use towel bars as grab bars.  Use non-skid mats or decals in the tub or shower.  If you need to sit down in the shower, use a plastic, non-slip stool.  Keep the floor dry. Clean up any water that spills on the floor as soon as it happens.  Remove soap buildup in the tub or shower regularly.  Attach bath mats securely with double-sided non-slip rug tape.  Do not have throw rugs and other things on the floor that can make you trip. What can I do in the bedroom?  Use night lights.  Make sure that you have a light by your bed that is easy to reach.  Do not use any sheets or blankets that are too big for your bed. They should not hang down onto the floor.  Have a firm chair that has side arms. You can use this for support while you get dressed.  Do not have throw rugs and other things on the floor that can make you  trip. What can I do in the kitchen?  Clean up any spills right away.  Avoid walking on wet floors.  Keep items that you use a lot in easy-to-reach places.  If you need to reach something above you, use a strong step stool that has a grab bar.  Keep electrical cords out of the way.  Do not use floor polish or wax that makes floors slippery. If you must use wax, use non-skid floor wax.  Do not have throw rugs and other things on the floor that can make you trip. What can I do with my stairs?  Do not leave any items on the stairs.  Make sure that there are handrails on both sides of the stairs and use them. Fix handrails that are broken or loose. Make sure that handrails are as long as the stairways.  Check any carpeting to make sure that it is firmly attached to the stairs. Fix any carpet that is loose or worn.  Avoid having throw rugs at the top or bottom of the stairs. If you do have throw rugs, attach them to the floor with carpet tape.  Make sure that you have a  light switch at the top of the stairs and the bottom of the stairs. If you do not have them, ask someone to add them for you. What else can I do to help prevent falls?  Wear shoes that:  Do not have high heels.  Have rubber bottoms.  Are comfortable and fit you well.  Are closed at the toe. Do not wear sandals.  If you use a stepladder:  Make sure that it is fully opened. Do not climb a closed stepladder.  Make sure that both sides of the stepladder are locked into place.  Ask someone to hold it for you, if possible.  Clearly mark and make sure that you can see:  Any grab bars or handrails.  First and last steps.  Where the edge of each step is.  Use tools that help you move around (mobility aids) if they are needed. These include:  Canes.  Walkers.  Scooters.  Crutches.  Turn on the lights when you go into a dark area. Replace any light bulbs as soon as they burn out.  Set up your furniture so you have a clear path. Avoid moving your furniture around.  If any of your floors are uneven, fix them.  If there are any pets around you, be aware of where they are.  Review your medicines with your doctor. Some medicines can make you feel dizzy. This can increase your chance of falling. Ask your doctor what other things that you can do to help prevent falls. This information is not intended to replace advice given to you by your health care provider. Make sure you discuss any questions you have with your health care provider. Document Released: 11/27/2008 Document Revised: 07/09/2015 Document Reviewed: 03/07/2014 Elsevier Interactive Patient Education  2017 [employer].

## 2019-09-11 NOTE — Progress Notes (Signed)
Subjective:  Patient ID: [name], female    DOB: 1930/12/01  Age: 86 y.o. MRN: [id number]  CC: Diagnoses of Unintentional weight loss, Stage 3a chronic kidney disease, Screening for colon cancer, Hyperlipidemia LDL goal <160, Myalgia due to statin, History of pulmonary embolism, Chronic fatigue, and Diarrhea, functional were pertinent to this visit.  HPI JESLIN BAZINET presents for follow up on  Multiple issues  This visit occurred during the SARS-CoV-2 public health emergency.  Safety protocols were in place, including screening questions prior to the visit, additional usage of staff PPE, and extensive cleaning of exam room while observing appropriate contact time as indicated for disinfecting solutions.    She was referred to pulmonary due to CT findings suggestive of  pulmonary fibrosis.  IPF ruled out by Pulmonary ,  Overnight pulse oximetry ordered   Housework makes her tired.   Has been having some bowel incontinence due to tenesmus and loose stool. Last episode was  yesterday after having a soft but formed stool earlier that day.  Lots of gas.  Feels better if she eats smaller meals . Using miralax intermittently for chronic constipation.  She recently read  on the Internet that increased abdominal gas and fatigue can be a sign of cancer .  Stools alternate between formed and liquid.  She has had small appetite for over 7 years. I asked her frankly if she wants me to start looking for cancer at her age .  I Reviewed prior GI evaluation  And colonoscopy in 2013 done  At the age of 25, by Gaylyn Cheers to evaluate rectal bleeding and anorexia.  3 small polyps were removed,  2  TA's without dysplasia and one HP. Diverticulosis and internal hemorrhoids noted.   I also reviewed her most recent GI evaluation at [facility] in October 2020:  " She reports she is currently having 3-4 BMs daily with formed stools without any urgency or incontinence. She does use MiraLAX as needed. Still  reports some intermittent straining. She denies any hematochezia or melena. There is no nocturnal diarrhea. She denies any abdominal pain, nausea, vomiting, dysphagia, or odynophagia. She reports she is still against any invasive GI evaluation with endoscopy or colonoscopy due to her age and other chronic health issues." "  Weight is finally stable.  She did lose 4 lbs over the prior 6 months  And 28 lbs since 2016.  She is not exercising . 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  Her medication is renally dosed;  GFR > 30 ml/min   Outpatient Medications Prior to Visit  Medication Sig Dispense Refill  . furosemide (LASIX) 20 MG tablet Take 1 tablet (20 mg total) by mouth daily. As needed for overnight weight gain 30 tablet 1  . PACERONE 200 MG tablet     . EUTHYROX 50 MCG tablet TAKE 1 TABLET BY MOUTH ONCE DAILY TAKE  30  MINUTES  PRIOR  TO  EATING 90 tablet 0  . mirtazapine (REMERON) 15 MG tablet 1/2 to 1 tablet 30 minutes before bedtime daily 30 tablet 2  . Rivaroxaban (XARELTO) 15 MG TABS tablet Take 1 tablet (15 mg total) by mouth daily. 90 tablet 2  . ALPRAZolam (XANAX) 0.25 MG tablet Take 1 tablet (0.25 mg total) by mouth  2 (two) times daily as needed for anxiety. (Patient not taking: Reported on 09/11/2019) 45 tablet 0  . triamcinolone cream (KENALOG) 0.1 % Apply 1 application topically 2 (two) times daily. Prn upper back avoid face, privates, under arms (Patient not taking: Reported on 09/11/2019) 454 g 0   No facility-administered medications prior to visit.    Review of Systems;  Patient denies headache, fevers, malaise, unintentional weight loss, skin rash, eye pain, sinus congestion and sinus pain, sore throat, dysphagia,  hemoptysis , cough, dyspnea, wheezing, chest pain, palpitations, orthopnea, edema,  abdominal pain, nausea, melena, diarrhea, constipation, flank pain, dysuria, hematuria, urinary  Frequency, nocturia, numbness, tingling, seizures,  Focal weakness, Loss of consciousness,  Tremor, insomnia, depression, anxiety, and suicidal ideation.      Objective:  BP (!) 146/68 (BP Location: Left Arm, Patient Position: Sitting, Cuff Size: Normal)   Pulse 55   Temp 97.9 F (36.6 C) (Oral)   Resp 15   Ht 5\' 3"  (1.6 m)   Wt 107 lb 12.8 oz (48.9 kg)   SpO2 97%   BMI 19.10 kg/m   BP Readings from Last 3 Encounters:  09/11/19 (!) 146/68  08/14/19 124/72  07/18/19 (!) 160/82    Wt Readings from Last 3 Encounters:  09/11/19 107 lb 12.8 oz (48.9 kg)  09/11/19 107 lb (48.5 kg)  08/14/19 107 lb (48.5 kg)    General appearance: alert, cooperative and appears stated age Ears: normal TM's and external ear canals both ears Throat: lips, mucosa, and tongue normal; teeth and gums normal Neck: no adenopathy, no carotid bruit, supple, symmetrical, trachea midline and thyroid not enlarged, symmetric, no tenderness/mass/nodules Back: symmetric, no curvature. ROM normal. No CVA tenderness. Lungs: clear to auscultation bilaterally Heart: regular rate and rhythm, S1, S2 normal, no murmur, click, rub or gallop Abdomen: soft, non-tender; bowel sounds normal; no masses,  no organomegaly Pulses: 2+ and symmetric Skin: Skin color, texture, turgor normal. No rashes or lesions Lymph nodes: Cervical, supraclavicular, and axillary nodes normal.  No results found for: HGBA1C  Lab Results  Component Value Date   CREATININE 1.10 06/20/2019   CREATININE 1.08 04/03/2019   CREATININE 1.20 09/11/2018    Lab Results  Component Value Date   WBC 4.6 06/20/2019   HGB 13.1 06/20/2019   HCT 40.6 06/20/2019   PLT 244.0 06/20/2019   GLUCOSE 81 06/20/2019   CHOL 208 (H) 08/31/2018   TRIG 94.0 08/31/2018   HDL 72.40 08/31/2018   LDLDIRECT 185.3 03/27/2013   LDLCALC 116 (H) 08/31/2018   ALT 37 (H)  06/20/2019   AST 36 06/20/2019   NA 139 06/20/2019   K 4.4 06/20/2019   CL 104 06/20/2019   CREATININE 1.10 06/20/2019   BUN 16 06/20/2019   CO2 28 06/20/2019   TSH 2.94 06/20/2019   INR 1.03 03/04/2016    MM 3D SCREEN BREAST BILATERAL  Result Date: 11/07/2018 CLINICAL DATA:  Screening. EXAM: DIGITAL SCREENING BILATERAL MAMMOGRAM WITH TOMO AND CAD COMPARISON:  Previous exam(s). ACR Breast Density Category b: There are scattered areas of fibroglandular density. FINDINGS: There are no findings suspicious for malignancy. Images were processed with CAD. IMPRESSION: No mammographic evidence of malignancy. A result letter of this screening mammogram will be mailed directly to the patient. RECOMMENDATION: Screening mammogram in one year. (Code:SM-B-01Y) BI-RADS CATEGORY  1: Negative. Electronically Signed   By: Ammie Ferrier M.D.   On: 11/07/2018 15:20    Assessment & Plan:   Problem List Items Addressed This Visit  Unprioritized   Unintentional weight loss    Her weight has recently plateaued.  She is down 28 lbs since 2016.  Etiology likely multifactorial and includes anorexia without abdominal pain but with chronic anxiety.  Weight has stabilized with initiation of  Mirtazapine so far. She has flatly declined invasive workup during October 2020 GI evaluation for same.       Stage 3a chronic kidney disease    Stable.  Continue renal dosing of all meds including Xarelto.  Lab Results  Component Value Date   CREATININE 1.10 06/20/2019   Lab Results  Component Value Date   NA 139 06/20/2019   K 4.4 06/20/2019   CL 104 06/20/2019   CO2 28 06/20/2019         Screening for colon cancer    Last colonoscopy at age 66, with annual FOBTS since then which have been intermittently positive. No further workup per patient preference.  IDA ruled out in may   Lab Results  Component Value Date   WBC 4.6 06/20/2019   HGB 13.1 06/20/2019   HCT 40.6 06/20/2019   MCV 89.8 06/20/2019    PLT 244.0 06/20/2019   Lab Results  Component Value Date   IRON 78 06/20/2019   TIBC 433 06/20/2019   FERRITIN 32 06/20/2019         Myalgia due to statin    She continues to defer treatment of atherosclerosis       Hyperlipidemia LDL goal <160    With atherosclerotic CVD ,  Untreated due to patient 's history of statin myalgias.       Relevant Medications   PACERONE 200 MG tablet   History of pulmonary embolism    Her PE was unprovoked.  Continue lifelong anticoagulation      Fatigue    Multifactorial , with complicated grief,  atrial fibrillation,  Systolic dysfunction and deconditioning contributing.  Recommended cardiopulmonary rehab at last visit and she declined.  encouraged to start a walking program       Diarrhea, functional    Intermittent , with history of chronic constipation managed with prn miralax. Advised to keep a food diary to rule out intolerances        A total of 30 minutes of face to face time was spent with patient more than half of which was spent in counselling and coordination of care   I have discontinued Blanch Media A. Zaragoza's triamcinolone cream. I am also having her maintain her furosemide, ALPRAZolam, and Pacerone.  No orders of the defined types were placed in this encounter.   Medications Discontinued During This Encounter  Medication Reason  . triamcinolone cream (KENALOG) 0.1 % Completed Course    Follow-up: Return in about 3 months (around 12/12/2019).   [name], MD

## 2019-09-12 ENCOUNTER — Telehealth: Payer: Self-pay | Admitting: Internal Medicine

## 2019-09-12 MED ORDER — RIVAROXABAN 15 MG PO TABS: 15.0000 mg | ORAL_TABLET | Freq: Every day | ORAL | 2 refills | Status: DC

## 2019-09-12 MED ORDER — LEVOTHYROXINE SODIUM 50 MCG PO TABS: ORAL_TABLET | ORAL | 0 refills | Status: DC

## 2019-09-12 MED ORDER — MIRTAZAPINE 15 MG PO TABS: ORAL_TABLET | ORAL | 2 refills | Status: DC

## 2019-09-12 NOTE — Telephone Encounter (Signed)
[facility] called and states Pt needs a refill on Rivaroxaban (XARELTO) 15 MG TABS tablet , mirtazapine (REMERON) 15 MG tablet, and levothyroxine? Please advise

## 2019-09-15 DIAGNOSIS — K591 Functional diarrhea: Secondary | ICD-10-CM | POA: Insufficient documentation

## 2019-09-15 NOTE — Assessment & Plan Note (Signed)
Stable.  Continue renal dosing of all meds including Xarelto.  Lab Results  Component Value Date   CREATININE 1.10 06/20/2019   Lab Results  Component Value Date   NA 139 06/20/2019   K 4.4 06/20/2019   CL 104 06/20/2019   CO2 28 06/20/2019

## 2019-09-15 NOTE — Assessment & Plan Note (Signed)
She continues to defer treatment of atherosclerosis

## 2019-09-15 NOTE — Assessment & Plan Note (Signed)
Last colonoscopy at age 86, with annual FOBTS since then which have been intermittently positive. No further workup per patient preference.  IDA ruled out in may   Lab Results  Component Value Date   WBC 4.6 06/20/2019   HGB 13.1 06/20/2019   HCT 40.6 06/20/2019   MCV 89.8 06/20/2019   PLT 244.0 06/20/2019   Lab Results  Component Value Date   IRON 78 06/20/2019   TIBC 433 06/20/2019   FERRITIN 32 06/20/2019

## 2019-09-15 NOTE — Assessment & Plan Note (Signed)
With atherosclerotic CVD ,  Untreated due to patient 's history of statin myalgias.  

## 2019-09-15 NOTE — Assessment & Plan Note (Signed)
Her weight has recently plateaued.  She is down 28 lbs since 2016.  Etiology likely multifactorial and includes anorexia without abdominal pain but with chronic anxiety.  Weight has stabilized with initiation of  Mirtazapine so far. She has flatly declined invasive workup during October 2020 GI evaluation for same.

## 2019-09-15 NOTE — Assessment & Plan Note (Signed)
Her PE was unprovoked.  Continue lifelong anticoagulation

## 2019-09-15 NOTE — Assessment & Plan Note (Signed)
Multifactorial , with complicated grief,  atrial fibrillation,  Systolic dysfunction and deconditioning contributing.  Recommended cardiopulmonary rehab at last visit and she declined.  encouraged to start a walking program

## 2019-09-15 NOTE — Assessment & Plan Note (Signed)
Intermittent , with history of chronic constipation managed with prn miralax. Advised to keep a food diary to rule out intolerances

## 2019-10-04 DIAGNOSIS — I480 Paroxysmal atrial fibrillation: Secondary | ICD-10-CM | POA: Diagnosis not present

## 2019-10-04 DIAGNOSIS — R0600 Dyspnea, unspecified: Secondary | ICD-10-CM | POA: Diagnosis not present

## 2019-10-04 DIAGNOSIS — I050 Rheumatic mitral stenosis: Secondary | ICD-10-CM | POA: Diagnosis not present

## 2019-10-04 DIAGNOSIS — I5020 Unspecified systolic (congestive) heart failure: Secondary | ICD-10-CM | POA: Diagnosis not present

## 2019-10-04 DIAGNOSIS — R0602 Shortness of breath: Secondary | ICD-10-CM | POA: Diagnosis not present

## 2019-10-14 DIAGNOSIS — H353212 Exudative age-related macular degeneration, right eye, with inactive choroidal neovascularization: Secondary | ICD-10-CM | POA: Diagnosis not present

## 2019-10-24 ENCOUNTER — Other Ambulatory Visit: Payer: Self-pay | Admitting: Internal Medicine

## 2019-11-07 DIAGNOSIS — I5020 Unspecified systolic (congestive) heart failure: Secondary | ICD-10-CM | POA: Diagnosis not present

## 2019-11-07 DIAGNOSIS — R0600 Dyspnea, unspecified: Secondary | ICD-10-CM | POA: Diagnosis not present

## 2019-11-07 DIAGNOSIS — R0602 Shortness of breath: Secondary | ICD-10-CM | POA: Diagnosis not present

## 2019-11-07 DIAGNOSIS — I050 Rheumatic mitral stenosis: Secondary | ICD-10-CM | POA: Diagnosis not present

## 2019-11-12 DIAGNOSIS — H353212 Exudative age-related macular degeneration, right eye, with inactive choroidal neovascularization: Secondary | ICD-10-CM | POA: Diagnosis not present

## 2019-11-12 DIAGNOSIS — H353221 Exudative age-related macular degeneration, left eye, with active choroidal neovascularization: Secondary | ICD-10-CM | POA: Diagnosis not present

## 2019-11-22 ENCOUNTER — Telehealth: Payer: Self-pay | Admitting: Internal Medicine

## 2019-11-22 ENCOUNTER — Other Ambulatory Visit: Payer: Self-pay

## 2019-11-22 DIAGNOSIS — I5020 Unspecified systolic (congestive) heart failure: Secondary | ICD-10-CM | POA: Diagnosis not present

## 2019-11-22 DIAGNOSIS — R0989 Other specified symptoms and signs involving the circulatory and respiratory systems: Secondary | ICD-10-CM | POA: Diagnosis not present

## 2019-11-22 DIAGNOSIS — I050 Rheumatic mitral stenosis: Secondary | ICD-10-CM | POA: Diagnosis not present

## 2019-11-22 DIAGNOSIS — I071 Rheumatic tricuspid insufficiency: Secondary | ICD-10-CM | POA: Diagnosis not present

## 2019-11-22 DIAGNOSIS — I10 Essential (primary) hypertension: Secondary | ICD-10-CM | POA: Diagnosis not present

## 2019-11-22 DIAGNOSIS — I341 Nonrheumatic mitral (valve) prolapse: Secondary | ICD-10-CM | POA: Diagnosis not present

## 2019-11-22 DIAGNOSIS — I480 Paroxysmal atrial fibrillation: Secondary | ICD-10-CM | POA: Diagnosis not present

## 2019-11-22 DIAGNOSIS — I272 Pulmonary hypertension, unspecified: Secondary | ICD-10-CM | POA: Diagnosis not present

## 2019-11-22 MED ORDER — MIRTAZAPINE 15 MG PO TABS: ORAL_TABLET | ORAL | 0 refills | Status: DC

## 2019-11-22 NOTE — Telephone Encounter (Addendum)
Patient called in stated that her prescription formirtazapine (REMERON) 15 MG tablet  with [facility] got messed up and wanted to know if she could get 10 pills until order came in from [facility]

## 2019-11-25 DIAGNOSIS — E782 Mixed hyperlipidemia: Secondary | ICD-10-CM | POA: Diagnosis not present

## 2019-11-25 DIAGNOSIS — I071 Rheumatic tricuspid insufficiency: Secondary | ICD-10-CM | POA: Diagnosis not present

## 2019-11-25 DIAGNOSIS — I480 Paroxysmal atrial fibrillation: Secondary | ICD-10-CM | POA: Diagnosis not present

## 2019-11-25 DIAGNOSIS — I10 Essential (primary) hypertension: Secondary | ICD-10-CM | POA: Diagnosis not present

## 2019-11-25 DIAGNOSIS — I050 Rheumatic mitral stenosis: Secondary | ICD-10-CM | POA: Diagnosis not present

## 2019-11-25 DIAGNOSIS — I5020 Unspecified systolic (congestive) heart failure: Secondary | ICD-10-CM | POA: Diagnosis not present

## 2019-11-25 DIAGNOSIS — R0789 Other chest pain: Secondary | ICD-10-CM | POA: Diagnosis not present

## 2019-11-25 DIAGNOSIS — R079 Chest pain, unspecified: Secondary | ICD-10-CM | POA: Diagnosis not present

## 2019-11-27 ENCOUNTER — Encounter: Payer: Self-pay | Admitting: *Deleted

## 2019-11-27 ENCOUNTER — Ambulatory Visit
Admission: EM | Admit: 2019-11-27 | Discharge: 2019-11-27 | Disposition: A | Discharge status: Home / Self Care | Payer: Medicare HMO | Source: Ambulatory Visit | Attending: Gastroenterology | Admitting: Gastroenterology

## 2019-11-27 ENCOUNTER — Encounter: Payer: Self-pay | Admitting: Nurse Practitioner

## 2019-11-27 ENCOUNTER — Other Ambulatory Visit: Payer: Self-pay

## 2019-11-27 ENCOUNTER — Telehealth (INDEPENDENT_AMBULATORY_CARE_PROVIDER_SITE_OTHER): Payer: Medicare HMO | Admitting: Nurse Practitioner

## 2019-11-27 ENCOUNTER — Encounter
Admission: EM | Disposition: A | Discharge status: Home / Self Care | Payer: Self-pay | Source: Ambulatory Visit | Attending: Gastroenterology

## 2019-11-27 ENCOUNTER — Ambulatory Visit: Payer: Medicare HMO | Admitting: Anesthesiology

## 2019-11-27 VITALS — BP 158/79 | HR 79 | Ht 63.0 in | Wt 105.0 lb

## 2019-11-27 DIAGNOSIS — T18128A Food in esophagus causing other injury, initial encounter: Secondary | ICD-10-CM | POA: Insufficient documentation

## 2019-11-27 DIAGNOSIS — R1310 Dysphagia, unspecified: Secondary | ICD-10-CM | POA: Diagnosis not present

## 2019-11-27 DIAGNOSIS — K2970 Gastritis, unspecified, without bleeding: Secondary | ICD-10-CM | POA: Diagnosis not present

## 2019-11-27 DIAGNOSIS — K2900 Acute gastritis without bleeding: Secondary | ICD-10-CM | POA: Diagnosis not present

## 2019-11-27 DIAGNOSIS — N1831 Chronic kidney disease, stage 3a: Secondary | ICD-10-CM | POA: Diagnosis not present

## 2019-11-27 DIAGNOSIS — Z01812 Encounter for preprocedural laboratory examination: Secondary | ICD-10-CM | POA: Diagnosis not present

## 2019-11-27 DIAGNOSIS — Z20822 Contact with and (suspected) exposure to covid-19: Secondary | ICD-10-CM | POA: Diagnosis not present

## 2019-11-27 DIAGNOSIS — X58XXXA Exposure to other specified factors, initial encounter: Secondary | ICD-10-CM | POA: Insufficient documentation

## 2019-11-27 DIAGNOSIS — Z7901 Long term (current) use of anticoagulants: Secondary | ICD-10-CM | POA: Diagnosis not present

## 2019-11-27 DIAGNOSIS — I4891 Unspecified atrial fibrillation: Secondary | ICD-10-CM | POA: Diagnosis not present

## 2019-11-27 DIAGNOSIS — K2960 Other gastritis without bleeding: Secondary | ICD-10-CM | POA: Diagnosis not present

## 2019-11-27 DIAGNOSIS — K222 Esophageal obstruction: Secondary | ICD-10-CM | POA: Insufficient documentation

## 2019-11-27 DIAGNOSIS — I509 Heart failure, unspecified: Secondary | ICD-10-CM | POA: Insufficient documentation

## 2019-11-27 DIAGNOSIS — Z79890 Hormone replacement therapy (postmenopausal): Secondary | ICD-10-CM | POA: Diagnosis not present

## 2019-11-27 DIAGNOSIS — I110 Hypertensive heart disease with heart failure: Secondary | ICD-10-CM | POA: Diagnosis not present

## 2019-11-27 DIAGNOSIS — E034 Atrophy of thyroid (acquired): Secondary | ICD-10-CM | POA: Diagnosis not present

## 2019-11-27 DIAGNOSIS — R634 Abnormal weight loss: Secondary | ICD-10-CM | POA: Diagnosis not present

## 2019-11-27 DIAGNOSIS — Z79899 Other long term (current) drug therapy: Secondary | ICD-10-CM | POA: Diagnosis not present

## 2019-11-27 DIAGNOSIS — I130 Hypertensive heart and chronic kidney disease with heart failure and stage 1 through stage 4 chronic kidney disease, or unspecified chronic kidney disease: Secondary | ICD-10-CM | POA: Diagnosis not present

## 2019-11-27 DIAGNOSIS — K219 Gastro-esophageal reflux disease without esophagitis: Secondary | ICD-10-CM | POA: Insufficient documentation

## 2019-11-27 DIAGNOSIS — Z86711 Personal history of pulmonary embolism: Secondary | ICD-10-CM | POA: Insufficient documentation

## 2019-11-27 DIAGNOSIS — E785 Hyperlipidemia, unspecified: Secondary | ICD-10-CM | POA: Diagnosis not present

## 2019-11-27 HISTORY — PX: ESOPHAGOGASTRODUODENOSCOPY (EGD) WITH PROPOFOL: SHX5813

## 2019-11-27 SURGERY — ESOPHAGOGASTRODUODENOSCOPY (EGD) WITH PROPOFOL
Anesthesia: General

## 2019-11-27 MED ORDER — SUCCINYLCHOLINE CHLORIDE 20 MG/ML IJ SOLN
INTRAMUSCULAR | Status: DC | PRN
  Administered 2019-11-27: 100 mg via INTRAVENOUS

## 2019-11-27 MED ORDER — SODIUM CHLORIDE 0.9 % IV SOLN: INTRAVENOUS | Status: DC

## 2019-11-27 MED ORDER — FENTANYL CITRATE (PF) 100 MCG/2ML IJ SOLN
INTRAMUSCULAR | Status: DC | PRN
Start: 2019-11-27 — End: 2019-11-27
  Administered 2019-11-27: 50 ug via INTRAVENOUS

## 2019-11-27 MED ORDER — FENTANYL CITRATE (PF) 100 MCG/2ML IJ SOLN
INTRAMUSCULAR | Status: AC
  Filled 2019-11-27: qty 2

## 2019-11-27 MED ORDER — ONDANSETRON HCL 4 MG/2ML IJ SOLN
INTRAMUSCULAR | Status: DC | PRN
  Administered 2019-11-27: 4 mg via INTRAVENOUS

## 2019-11-27 MED ORDER — SUGAMMADEX SODIUM 200 MG/2ML IV SOLN
INTRAVENOUS | Status: DC | PRN
  Administered 2019-11-27: 95.2 mg via INTRAVENOUS

## 2019-11-27 MED ORDER — ROCURONIUM BROMIDE 100 MG/10ML IV SOLN
INTRAVENOUS | Status: DC | PRN
  Administered 2019-11-27: 10 mg via INTRAVENOUS
  Administered 2019-11-27: 40 mg via INTRAVENOUS

## 2019-11-27 MED ORDER — PROPOFOL 10 MG/ML IV BOLUS
INTRAVENOUS | Status: DC | PRN
  Administered 2019-11-27: 60 mg via INTRAVENOUS

## 2019-11-27 MED ORDER — PHENYLEPHRINE HCL (PRESSORS) 10 MG/ML IV SOLN
INTRAVENOUS | Status: DC | PRN
  Administered 2019-11-27 (×2): 100 ug via INTRAVENOUS
  Administered 2019-11-27: 200 ug via INTRAVENOUS
  Administered 2019-11-27: 100 ug via INTRAVENOUS

## 2019-11-27 MED ORDER — LIDOCAINE HCL (CARDIAC) PF 100 MG/5ML IV SOSY
PREFILLED_SYRINGE | INTRAVENOUS | Status: DC | PRN
  Administered 2019-11-27: 60 mg via INTRAVENOUS

## 2019-11-27 MED ORDER — DEXAMETHASONE SODIUM PHOSPHATE 10 MG/ML IJ SOLN
INTRAMUSCULAR | Status: DC | PRN
  Administered 2019-11-27: 10 mg via INTRAVENOUS

## 2019-11-27 MED ORDER — METOPROLOL TARTRATE 5 MG/5ML IV SOLN
INTRAVENOUS | Status: DC | PRN
  Administered 2019-11-27: 5 mg via INTRAVENOUS

## 2019-11-27 NOTE — Anesthesia Preprocedure Evaluation (Signed)
Anesthesia Evaluation  Patient identified by MRN, date of birth, ID band Patient awake    Reviewed: Allergy & Precautions, H&P , NPO status , Patient's Chart, lab work & pertinent test results  History of Anesthesia Complications Negative for: history of anesthetic complications  Airway Mallampati: III  TM Distance: <3 FB Neck ROM: limited    Dental  (+) Chipped, Poor Dentition, Missing, Partial Lower, Upper Dentures   Pulmonary neg pulmonary ROS, neg shortness of breath,    Pulmonary exam normal        Cardiovascular Exercise Tolerance: Good hypertension, +CHF  + dysrhythmias Atrial Fibrillation  Rhythm:irregular Rate:Tachycardia     Neuro/Psych PSYCHIATRIC DISORDERS  Neuromuscular disease negative psych ROS   GI/Hepatic Neg liver ROS, GERD  Controlled and Medicated,  Endo/Other  negative endocrine ROS  Renal/GU Renal disease     Musculoskeletal  (+) Arthritis ,   Abdominal   Peds  Hematology negative hematology ROS (+)   Anesthesia Other Findings Past Medical History: No date: Atrial fibrillation (HCC) No date: GERD (gastroesophageal reflux disease) No date: Mitral regurgitation 02/2016: Pulmonary embolism ([place])  Past Surgical History: 1960: APPENDECTOMY 2017: BREAST CYST ASPIRATION; Left 04/05/2016: CARDIOVERSION; N/A     Comment:  Procedure: Cardioversion;  Surgeon: [name],               MD;  Location: [facility] ORS;  Service: Cardiovascular;                Laterality: N/A; 1985: CHOLECYSTECTOMY No date: OVARIAN CYST REMOVAL 02/01/2018: TEE WITHOUT CARDIOVERSION; N/A     Comment:  Procedure: TRANSESOPHAGEAL ECHOCARDIOGRAM (TEE);                Surgeon: [name], MD;  Location: [facility] ORS;                Service: Cardiovascular;  Laterality: N/A;  BMI    Body Mass Index: 18.60 kg/m      Reproductive/Obstetrics negative OB ROS                              Anesthesia Physical Anesthesia Plan  ASA: IV and emergent  Anesthesia Plan: General ETT   Post-op Pain Management:    Induction: Intravenous  PONV Risk Score and Plan: Ondansetron, Dexamethasone, Midazolam and Treatment may vary due to age or medical condition  Airway Management Planned: Oral ETT  Additional Equipment:   Intra-op Plan:   Post-operative Plan: Extubation in OR  Informed Consent: I have reviewed the patients History and Physical, chart, labs and discussed the procedure including the risks, benefits and alternatives for the proposed anesthesia with the patient or authorized representative who has indicated his/her understanding and acceptance.     Dental Advisory Given  Plan Discussed with: Anesthesiologist, CRNA and Surgeon  Anesthesia Plan Comments: (Patient consented for risks of anesthesia including but not limited to:  - adverse reactions to medications - damage to eyes, teeth, lips or other oral mucosa - nerve damage due to positioning  - sore throat or hoarseness - Damage to heart, brain, nerves, lungs, other parts of body or loss of life  Patient voiced understanding.)        Anesthesia Quick Evaluation

## 2019-11-27 NOTE — Anesthesia Procedure Notes (Signed)
Procedure Name: Intubation Date/Time: 11/27/2019 4:11 PM Performed by: Nelda Marseille, CRNA Pre-anesthesia Checklist: Patient identified, Patient being monitored, Timeout performed, Emergency Drugs available and Suction available Patient Re-evaluated:Patient Re-evaluated prior to induction Oxygen Delivery Method: Circle system utilized Preoxygenation: Pre-oxygenation with 100% oxygen Induction Type: IV induction Ventilation: Mask ventilation without difficulty Laryngoscope Size: Mac, 3 and McGraph Grade View: Grade I Tube type: Oral Tube size: 7.0 mm Number of attempts: 1 Airway Equipment and Method: Stylet Placement Confirmation: ETT inserted through vocal cords under direct vision,  positive ETCO2 and breath sounds checked- equal and bilateral Secured at: 21 cm Tube secured with: Tape Dental Injury: Teeth and Oropharynx as per pre-operative assessment

## 2019-11-27 NOTE — Interval H&P Note (Signed)
History and Physical Interval Note:  11/27/2019 3:45 PM  Frances Kent  has presented today for surgery, with the diagnosis of food bolus.  The various methods of treatment have been discussed with the patient and family. After consideration of risks, benefits and other options for treatment, the patient has consented to  Procedure(s): ESOPHAGOGASTRODUODENOSCOPY (EGD) WITH PROPOFOL (N/A) as a surgical intervention.  The patient's history has been reviewed, patient examined, no change in status, stable for surgery.  I have reviewed the patient's chart and labs.  Questions were answered to the patient's satisfaction.     Lesly Rubenstein  Ok to proceed with EGD.

## 2019-11-27 NOTE — Op Note (Signed)
[facility] Gastroenterology Patient Name: Frances Kent Procedure Date: 11/27/2019 3:57 PM MRN: [id number] Account #: [id number] Date of Birth: 03/31/1930 Admit Type: Outpatient Age: 86 Room: [facility] ENDO ROOM 3 Gender: Female Note Status: Finalized Procedure:             Upper GI endoscopy Indications:           Foreign body in the esophagus Providers:             Andrey Farmer MD, MD Medicines:             Monitored Anesthesia Care Complications:         No immediate complications. Estimated blood loss:                         Minimal. Procedure:             Pre-Anesthesia Assessment:                        - Prior to the procedure, a History and Physical was                         performed, and patient medications and allergies were                         reviewed. The patient is competent. The risks and                         benefits of the procedure and the sedation options and                         risks were discussed with the patient. All questions                         were answered and informed consent was obtained.                         Patient identification and proposed procedure were                         verified by the physician, the nurse, the anesthetist                         and the technician in the endoscopy suite. Mental                         Status Examination: alert and oriented. Airway                         Examination: normal oropharyngeal airway and neck                         mobility. Respiratory Examination: clear to                         auscultation. CV Examination: normal. Prophylactic                         Antibiotics: The patient does not require prophylactic  antibiotics. Prior Anticoagulants: The patient has                         taken anticoagulant medication, last dose was 1 day                         prior to procedure. ASA Grade Assessment: III - A                          patient with severe systemic disease. After reviewing                         the risks and benefits, the patient was deemed in                         satisfactory condition to undergo the procedure. The                         anesthesia plan was to use monitored anesthesia care                         (MAC). Immediately prior to administration of                         medications, the patient was re-assessed for adequacy                         to receive sedatives. The heart rate, respiratory                         rate, oxygen saturations, blood pressure, adequacy of                         pulmonary ventilation, and response to care were                         monitored throughout the procedure. The physical                         status of the patient was re-assessed after the                         procedure.                        After obtaining informed consent, the endoscope was                         passed under direct vision. Throughout the procedure,                         the patient's blood pressure, pulse, and oxygen                         saturations were monitored continuously. The Endoscope                         was introduced through the mouth, and advanced to the  second part of duodenum. The upper GI endoscopy was                         accomplished without difficulty. The patient tolerated                         the procedure well. Findings:      Food was found in the lower third of the esophagus. The food was       extremely soft and mushed. It was very easily pushed into stomach.      A mild Schatzki ring was found in the lower third of the esophagus. This       is likely the nidus of food impaction but was patent.      Food (residue) was found in the gastric fundus.      Patchy mild inflammation characterized by erythema was found in the       stomach. Biopsies were taken with a cold forceps for histology.       Estimated  blood loss was minimal.      The examined duodenum was normal. Impression:            - Food in the lower third of the esophagus. Gently                         pushed into stomach.                        - Mild Schatzki ring.                        - Food (residue) in the stomach.                        - Gastritis. Biopsied.                        - Normal examined duodenum. Recommendation:        - Discharge patient to home.                        - Soft diet.                        - Resume anticoagulant medication at prior dose                         tomorrow.                        - Await pathology results.                        - Repeat upper endoscopy in 2 weeks for dilation of                         schatzki ring.                        - Return to referring physician as previously                         scheduled.                        -  Use Protonix (pantoprazole) 40 mg PO daily. Procedure Code(s):     --- Professional ---                        [phone], Esophagogastroduodenoscopy, flexible,                         transoral; with biopsy, single or multiple Diagnosis Code(s):     --- Professional ---                        K87.681L, Food in esophagus causing other injury,                         initial encounter                        K22.2, Esophageal obstruction                        K29.70, Gastritis, unspecified, without bleeding                        T18.108A, Unspecified foreign body in esophagus                         causing other injury, initial encounter CPT copyright 2019 [employer]. All rights reserved. The codes documented in this report are preliminary and upon coder review may  be revised to meet current compliance requirements. Andrey Farmer, MD Andrey Farmer MD, MD 11/27/2019 4:28:05 PM Number of Addenda: 0 Note Initiated On: 11/27/2019 3:57 PM Estimated Blood Loss:  Estimated blood loss was minimal.      [facility]

## 2019-11-27 NOTE — H&P (Signed)
Outpatient short stay form Pre-procedure 11/27/2019 3:35 PM Raylene Miyamoto MD, MPH  Primary Physician: Dr. Derrel Nip  Reason for visit:  Food impaction  History of present illness:   86 y/o lady seen in clinic today with signs/symptoms of esophageal obstruction due to food impaction. She had a piece of pork chop on Sunday and immediately felt it stick. Swallowing has been painful. History of GERD but doesn't take PPI. Takes a NOAC with last dose being yesterday but may have vomited it up.    Current Facility-Administered Medications:  .  0.9 %  sodium chloride infusion, , Intravenous, Continuous, Soul Deveney, Hilton Cork, MD  Medications Prior to Admission  Medication Sig Dispense Refill Last Dose  . amLODipine (NORVASC) 2.5 MG tablet Take by mouth.   11/26/2019 at Unknown time  . furosemide (LASIX) 20 MG tablet Take 1 tablet (20 mg total) by mouth daily. As needed for overnight weight gain 30 tablet 1 11/26/2019 at Unknown time  . levothyroxine (EUTHYROX) 50 MCG tablet TAKE 1 TABLET BY MOUTH ONCE DAILY TAKE  30  MINUTES  PRIOR  TO  EATING 90 tablet 0 11/26/2019 at Unknown time  . mirtazapine (REMERON) 15 MG tablet 1/2 to 1 tablet 30 minutes before bedtime daily 10 tablet 0 11/26/2019 at Unknown time  . XARELTO 15 MG TABS tablet Take 1 tablet by mouth once daily 90 tablet 0 11/26/2019 at Unknown time     No Known Allergies   Past Medical History:  Diagnosis Date  . Atrial fibrillation ([place])   . GERD (gastroesophageal reflux disease)   . Mitral regurgitation   . Pulmonary embolism ([place]) 02/2016    Review of systems:  Otherwise negative.    Physical Exam  Gen: Alert, oriented. Appears stated age.  HEENT: [place]/AT. PERRLA. Lungs: No respiratory distress Abd: soft, benign, no masses. Ext: No edema.     Planned procedures: Proceed with EGD. The patient understands the nature of the planned procedure, indications, risks, alternatives and potential complications including but not  limited to bleeding, infection, perforation, damage to internal organs and possible oversedation/side effects from anesthesia. The patient agrees and gives consent to proceed.  Please refer to procedure notes for findings, recommendations and patient disposition/instructions.     Raylene Miyamoto MD, MPH Gastroenterology 11/27/2019  3:35 PM

## 2019-11-27 NOTE — Transfer of Care (Signed)
Immediate Anesthesia Transfer of Care Note  Patient: Frances Kent  Procedure(s) Performed: ESOPHAGOGASTRODUODENOSCOPY (EGD) WITH PROPOFOL (N/A )  Patient Location: PACU  Anesthesia Type:General  Level of Consciousness: awake, alert  and oriented  Airway & Oxygen Therapy: Patient Spontanous Breathing and Patient connected to face mask oxygen  Post-op Assessment: Report given to RN and Post -op Vital signs reviewed and stable  Post vital signs: Reviewed and stable  Last Vitals:  Vitals Value Taken Time  BP 183/75 11/27/19 1635  Temp    Pulse 58 11/27/19 1640  Resp 13 11/27/19 1640  SpO2 100 % 11/27/19 1640  Vitals shown include unvalidated device data.  Last Pain:  Vitals:   11/27/19 1634  TempSrc:   PainSc: Asleep         Complications: No complications documented.

## 2019-11-27 NOTE — Progress Notes (Signed)
Virtual Visit via Telephone Note  This visit type was conducted due to national recommendations for restrictions regarding the COVID-19 pandemic (e.g. social distancing).  This format is felt to be most appropriate for this patient at this time.  All issues noted in this document were discussed and addressed.  No physical exam was performed (except for noted visual exam findings with Video Visits).   I connected with@ on 11/27/19 at  9:30 AM EDT by a video enabled telemedicine application or telephone and verified that I am speaking with the correct person using two identifiers. Location patient: home Location provider: work or home office Persons participating in the virtual visit: patient, provider  I discussed the limitations, risks, security and privacy concerns of performing an evaluation and management service by telephone and the availability of in person appointments. I also discussed with the patient that there may be a patient responsible charge related to this service. The patient expressed understanding and agreed to proceed.  Interactive audio and video telecommunications were attempted between this provider and patient, however failed, due to patient did not have access to video capability.  We continued and completed visit with audio only.   Reason for visit: Onset Sunday of painful swallowing with liquids and brings up phlegm, and a few bites of food that she eaten.  It hurts even to drink fluids.  Onset Sunday.  HPI: This is an 86 year old patient with history of severe esophagitis many years ago previously treated with Nexium.  She reports she has been off of her Nexium for several years now and has done well. Onset Sunday night of trouble swallowing with severe pain when she tries to swallow liquids or food.  She is able to swallow the liquid and thinks it goes all the way down, but then she will gag and bring up some phlegm.  She does not have anything in her stomach to throw up.   She has not been able to eat properly but just a few bites of food and then she will bring that up as well.  When she is drinks fluid, she brings up phlegm.  She has vague nausea.  No abdominal pain. She has no coughing or choking while swallowing.    No antibiotics recently, no doxycycline exposure. She does have a history of severe esophagitis in the past.  Patient did see Cardiology 2 days ago and mentioned this problem and underwent an EKG and no cardiac cause was identified.   ROS: She denies any fevers or chills, URI symptoms, congestion, cough.  She has occasional belching, but no heartburn or reflux or indigestion.  She has not been bothered by her stomach until this trouble with painful swallowing on Sunday.  See pertinent positives and negatives per HPI.  Past Medical History:  Diagnosis Date  . Atrial fibrillation ([place])   . GERD (gastroesophageal reflux disease)   . Mitral regurgitation   . Pulmonary embolism ([place]) 02/2016    Past Surgical History:  Procedure Laterality Date  . APPENDECTOMY  1960  . BREAST CYST ASPIRATION Left 2017  . CARDIOVERSION N/A 04/05/2016   Procedure: Cardioversion;  Surgeon: [name], MD;  Location: [facility] ORS;  Service: Cardiovascular;  Laterality: N/A;  . CHOLECYSTECTOMY  1985  . OVARIAN CYST REMOVAL    . TEE WITHOUT CARDIOVERSION N/A 02/01/2018   Procedure: TRANSESOPHAGEAL ECHOCARDIOGRAM (TEE);  Surgeon: [name], MD;  Location: [facility] ORS;  Service: Cardiovascular;  Laterality: N/A;    Family History  Problem Relation  Age of Onset  . Cancer Mother 79       breast cancer, lived to 40,   . Breast cancer Mother 48  . Cancer Son 40       pancreatic cancer  . Heart disease Son        CAD, Tobacco Abuse   . Heart disease Maternal Grandmother 64       died of massive MI  . Stroke Sister     SOCIAL HX: no tobacco   Current Outpatient Medications:  .  amLODipine (NORVASC) 2.5 MG tablet, Take by mouth., Disp: , Rfl:  .   furosemide (LASIX) 20 MG tablet, Take 1 tablet (20 mg total) by mouth daily. As needed for overnight weight gain, Disp: 30 tablet, Rfl: 1 .  levothyroxine (EUTHYROX) 50 MCG tablet, TAKE 1 TABLET BY MOUTH ONCE DAILY TAKE  30  MINUTES  PRIOR  TO  EATING, Disp: 90 tablet, Rfl: 0 .  mirtazapine (REMERON) 15 MG tablet, 1/2 to 1 tablet 30 minutes before bedtime daily, Disp: 10 tablet, Rfl: 0 .  XARELTO 15 MG TABS tablet, Take 1 tablet by mouth once daily, Disp: 90 tablet, Rfl: 0  EXAM:  VITALS per patient if applicable: Wt 105, 158/79-79  GENERAL: Sounds alert, oriented, in no acute distress  LUNGS: No audible  signs of respiratory distress, or coughing   PSYCH/NEURO: pleasant and cooperative, no obvious depression or anxiety, speech and thought processing grossly intact  ASSESSMENT AND PLAN:  Discussed the following assessment and plan:  Dysphagia, unspecified type  Painful swallowing  Gastroesophageal reflux disease, unspecified whether esophagitis present  I placed a call into her gastroenterology group, and discussed this case with Desma Paganini, [place]. He  will work her into the office today.  She has a painful swallowing, and reports phlegm comes up as well as small pieces of food  after she eats.  She is eating extremely very little, and hesitant to swallow because of pain and the subsequent phlegm that returns.  I am concerned that she may have an obstructive process to rule out severe esophagitis causing swelling inflammation and narrowing, stricture, candidiasis, malignancy.  This patient needs an in person evaluation and further testing.   Her daughter is present and will be able to take her to [facility] clinic today when arranged.  I discussed the assessment and treatment plan with the patient. The patient was provided an opportunity to ask questions and all were answered. The patient agreed with the plan and demonstrated an understanding of the instructions.   The patient was  advised to call back or seek an in-person evaluation if the symptoms worsen or if the condition fails to improve as anticipated.  I provided minutes 22 min of time during this encounter.  Denice Paradise, NP Adult Nurse Practitioner [place] [phone]

## 2019-11-27 NOTE — Anesthesia Postprocedure Evaluation (Signed)
Anesthesia Post Note  Patient: Frances Kent  Procedure(s) Performed: ESOPHAGOGASTRODUODENOSCOPY (EGD) WITH PROPOFOL (N/A )  Patient location during evaluation: PACU Anesthesia Type: General Level of consciousness: awake and alert Pain management: pain level controlled Vital Signs Assessment: post-procedure vital signs reviewed and stable Respiratory status: spontaneous breathing, nonlabored ventilation, respiratory function stable and patient connected to nasal cannula oxygen Cardiovascular status: blood pressure returned to baseline and stable Postop Assessment: no apparent nausea or vomiting Anesthetic complications: no   No complications documented.   Last Vitals:  Vitals:   11/27/19 1649 11/27/19 1700  BP: (!) 171/56 (!) 159/72  Pulse: 64 65  Resp: 13 14  Temp:  (!) 36.3 C  SpO2: 97% 94%    Last Pain:  Vitals:   11/27/19 1700  TempSrc:   PainSc: 0-No pain                 [name] [name]

## 2019-11-28 ENCOUNTER — Encounter: Payer: Self-pay | Admitting: Gastroenterology

## 2019-11-29 LAB — SURGICAL PATHOLOGY

## 2019-11-30 ENCOUNTER — Encounter: Payer: Self-pay | Admitting: Internal Medicine

## 2019-11-30 DIAGNOSIS — K2960 Other gastritis without bleeding: Secondary | ICD-10-CM | POA: Insufficient documentation

## 2019-12-09 DIAGNOSIS — H353212 Exudative age-related macular degeneration, right eye, with inactive choroidal neovascularization: Secondary | ICD-10-CM | POA: Diagnosis not present

## 2019-12-11 ENCOUNTER — Ambulatory Visit (INDEPENDENT_AMBULATORY_CARE_PROVIDER_SITE_OTHER): Payer: Medicare HMO | Admitting: Internal Medicine

## 2019-12-11 ENCOUNTER — Other Ambulatory Visit: Payer: Self-pay

## 2019-12-11 ENCOUNTER — Encounter: Payer: Self-pay | Admitting: Internal Medicine

## 2019-12-11 VITALS — BP 170/72 | HR 53 | Temp 97.7°F | Resp 16 | Ht 63.0 in | Wt 108.4 lb

## 2019-12-11 DIAGNOSIS — I10 Essential (primary) hypertension: Secondary | ICD-10-CM | POA: Diagnosis not present

## 2019-12-11 DIAGNOSIS — E034 Atrophy of thyroid (acquired): Secondary | ICD-10-CM

## 2019-12-11 DIAGNOSIS — E538 Deficiency of other specified B group vitamins: Secondary | ICD-10-CM

## 2019-12-11 DIAGNOSIS — R5382 Chronic fatigue, unspecified: Secondary | ICD-10-CM | POA: Diagnosis not present

## 2019-12-11 DIAGNOSIS — E785 Hyperlipidemia, unspecified: Secondary | ICD-10-CM

## 2019-12-11 DIAGNOSIS — Z7901 Long term (current) use of anticoagulants: Secondary | ICD-10-CM | POA: Diagnosis not present

## 2019-12-11 DIAGNOSIS — K222 Esophageal obstruction: Secondary | ICD-10-CM

## 2019-12-11 DIAGNOSIS — I700 Atherosclerosis of aorta: Secondary | ICD-10-CM

## 2019-12-11 DIAGNOSIS — F411 Generalized anxiety disorder: Secondary | ICD-10-CM

## 2019-12-11 DIAGNOSIS — I5020 Unspecified systolic (congestive) heart failure: Secondary | ICD-10-CM

## 2019-12-11 LAB — CBC WITH DIFFERENTIAL/PLATELET
Basophils Absolute: 0.3 10*3/uL — ABNORMAL HIGH (ref 0.0–0.1)
Basophils Relative: 4.8 % — ABNORMAL HIGH (ref 0.0–3.0)
Eosinophils Absolute: 0.1 10*3/uL (ref 0.0–0.7)
Eosinophils Relative: 1 % (ref 0.0–5.0)
HCT: 39.6 % (ref 36.0–46.0)
Hemoglobin: 13 g/dL (ref 12.0–15.0)
Lymphocytes Relative: 19.9 % (ref 12.0–46.0)
Lymphs Abs: 1.1 10*3/uL (ref 0.7–4.0)
MCHC: 32.8 g/dL (ref 30.0–36.0)
MCV: 91 fl (ref 78.0–100.0)
Monocytes Absolute: 0.6 10*3/uL (ref 0.1–1.0)
Monocytes Relative: 10.8 % (ref 3.0–12.0)
Neutro Abs: 3.4 10*3/uL (ref 1.4–7.7)
Neutrophils Relative %: 63.5 % (ref 43.0–77.0)
Platelets: 220 10*3/uL (ref 150.0–400.0)
RBC: 4.35 Mil/uL (ref 3.87–5.11)
RDW: 16 % — ABNORMAL HIGH (ref 11.5–15.5)
WBC: 5.3 10*3/uL (ref 4.0–10.5)

## 2019-12-11 LAB — LIPID PANEL
Cholesterol: 284 mg/dL — ABNORMAL HIGH (ref 0–200)
HDL: 100.1 mg/dL (ref >39.00)
LDL Cholesterol: 166 mg/dL — ABNORMAL HIGH (ref 0–99)
NonHDL: 183.99
Total CHOL/HDL Ratio: 3
Triglycerides: 91 mg/dL (ref 0.0–149.0)
VLDL: 18.2 mg/dL (ref 0.0–40.0)

## 2019-12-11 LAB — COMPREHENSIVE METABOLIC PANEL
ALT: 13 U/L (ref 0–35)
AST: 19 U/L (ref 0–37)
Albumin: 4.2 g/dL (ref 3.5–5.2)
Alkaline Phosphatase: 96 U/L (ref 39–117)
BUN: 10 mg/dL (ref 6–23)
CO2: 29 mEq/L (ref 19–32)
Calcium: 9.3 mg/dL (ref 8.4–10.5)
Chloride: 104 mEq/L (ref 96–112)
Creatinine, Ser: 1.11 mg/dL (ref 0.40–1.20)
GFR: 44.76 mL/min — ABNORMAL LOW (ref >60.00)
Glucose, Bld: 76 mg/dL (ref 70–99)
Potassium: 4.9 mEq/L (ref 3.5–5.1)
Sodium: 140 mEq/L (ref 135–145)
Total Bilirubin: 0.9 mg/dL (ref 0.2–1.2)
Total Protein: 6.5 g/dL (ref 6.0–8.3)

## 2019-12-11 LAB — TSH: TSH: 1.85 u[IU]/mL (ref 0.35–4.50)

## 2019-12-11 LAB — IBC + FERRITIN
Ferritin: 39.1 ng/mL (ref 10.0–291.0)
Iron: 109 ug/dL (ref 42–145)
Saturation Ratios: 28.9 % (ref 20.0–50.0)
Transferrin: 269 mg/dL (ref 212.0–360.0)

## 2019-12-11 NOTE — Progress Notes (Signed)
Subjective:  Patient ID: Frances Kent, female    DOB: [birth year]  Age: 86 y.o. MRN: [id number]  CC: The primary encounter diagnosis was Chronic fatigue. Diagnoses of Schatzki's ring of distal esophagus, Hyperlipidemia LDL goal <160, Long term current use of anticoagulant therapy, Hypothyroidism due to acquired atrophy of thyroid, Generalized anxiety disorder, B12 deficiency, Abdominal aortic atherosclerosis ([place]), Essential hypertension, and Systolic congestive heart failure, unspecified HF chronicity ([place]) were also pertinent to this visit.  HPI Frances Kent presents for follow up on unintentional weight loss,  Hypertension,  historyof PE and  Heart failure   This visit occurred during the SARS-CoV-2 public health emergency.  Safety protocols were in place, including screening questions prior to the visit, additional usage of staff PPE, and extensive cleaning of exam room while observing appropriate contact time as indicated for disinfecting solutions.   . Patient has received NO  doses of the available COVID 19 vaccines.   Patient continues to mask when outside of the home except when walking in yard or at safe distances from others .  Patient denies any change in mood or development of unhealthy behaviors resuting from the pandemic's restriction of activities and socialization.     Reviewed findings of prior CT scan today..  Patient is not  willing to  Retry statin therapy even with 20 mg crestor once weekly    Weight loss:  Has gained 1 lb since her visit in July,  Taking remeron  HTN:  Not taking amlodipine . Confused about meds   Has good days and "bad" days (fatigue).  Denies orthopnea and fluid retention .    Confused about her medications. Has a bottle of alprazolam 0.25  Hasn't used it "much" not sure how it makes her feel.  Gets nervous when she has a new pain   Reviewed recent episode of food impaction cleared by EGD   Outpatient Medications Prior to Visit   Medication Sig Dispense Refill  . furosemide (LASIX) 20 MG tablet Take 1 tablet (20 mg total) by mouth daily. As needed for overnight weight gain 30 tablet 1  . levothyroxine (EUTHYROX) 50 MCG tablet TAKE 1 TABLET BY MOUTH ONCE DAILY TAKE  30  MINUTES  PRIOR  TO  EATING 90 tablet 0  . mirtazapine (REMERON) 15 MG tablet 1/2 to 1 tablet 30 minutes before bedtime daily 10 tablet 0  . pantoprazole (PROTONIX) 20 MG tablet     . XARELTO 15 MG TABS tablet Take 1 tablet by mouth once daily 90 tablet 0  . amLODipine (NORVASC) 2.5 MG tablet Take by mouth. (Patient not taking: Reported on 12/11/2019)     No facility-administered medications prior to visit.    Review of Systems;  Patient denies headache, fevers, malaise, unintentional weight loss, skin rash, eye pain, sinus congestion and sinus pain, sore throat, dysphagia,  hemoptysis , cough, dyspnea, wheezing, chest pain, palpitations, orthopnea, edema, abdominal pain, nausea, melena, diarrhea, constipation, flank pain, dysuria, hematuria, urinary  Frequency, nocturia, numbness, tingling, seizures,  Focal weakness, Loss of consciousness,  Tremor, insomnia, depression, anxiety, and suicidal ideation.      Objective:  BP (!) 170/72 (BP Location: Left Arm, Patient Position: Sitting, Cuff Size: Normal)   Pulse (!) 53   Temp 97.7 F (36.5 C) (Oral)   Resp 16   Ht 5\' 3"  (1.6 m)   Wt 108 lb 6.4 oz (49.2 kg)   SpO2 99%   BMI 19.20 kg/m   BP Readings from Last 3  Encounters:  12/11/19 (!) 170/72  11/27/19 (!) 159/72  11/27/19 (!) 158/79    Wt Readings from Last 3 Encounters:  12/11/19 108 lb 6.4 oz (49.2 kg)  11/27/19 105 lb (47.6 kg)  11/27/19 105 lb (47.6 kg)    General appearance: alert, cooperative and appears stated age Ears: normal TM's and external ear canals both ears Throat: lips, mucosa, and tongue normal; teeth and gums normal Neck: no adenopathy, no carotid bruit, supple, symmetrical, trachea midline and thyroid not enlarged,  symmetric, no tenderness/mass/nodules Back: symmetric, no curvature. ROM normal. No CVA tenderness. Lungs: clear to auscultation bilaterally Heart: regular rate and rhythm, S1, S2 normal, no murmur, click, rub or gallop Abdomen: soft, non-tender; bowel sounds normal; no masses,  no organomegaly Pulses: 2+ and symmetric Skin: Skin color, texture, turgor normal. No rashes or lesions Lymph nodes: Cervical, supraclavicular, and axillary nodes normal.  No results found for: HGBA1C  Lab Results  Component Value Date   CREATININE 1.11 12/11/2019   CREATININE 1.10 06/20/2019   CREATININE 1.08 04/03/2019    Lab Results  Component Value Date   WBC 5.3 12/11/2019   HGB 13.0 12/11/2019   HCT 39.6 12/11/2019   PLT 220.0 12/11/2019   GLUCOSE 76 12/11/2019   CHOL 284 (H) 12/11/2019   TRIG 91.0 12/11/2019   HDL 100.10 12/11/2019   LDLDIRECT 185.3 03/27/2013   LDLCALC 166 (H) 12/11/2019   ALT 13 12/11/2019   AST 19 12/11/2019   NA 140 12/11/2019   K 4.9 12/11/2019   CL 104 12/11/2019   CREATININE 1.11 12/11/2019   BUN 10 12/11/2019   CO2 29 12/11/2019   TSH 1.85 12/11/2019   INR 1.03 03/04/2016    No results found.  Assessment & Plan:   Problem List Items Addressed This Visit      Unprioritized   Hyperlipidemia LDL goal <160    With atherosclerotic CVD ,  Untreated due to patient 's history of statin myalgias.       Relevant Medications   amLODipine (NORVASC) 2.5 MG tablet   Other Relevant Orders   Comprehensive metabolic panel (Completed)   Lipid panel (Completed)   Fatigue - Primary    Screening labs normal.  No history of snoring.  Multifactorial , with complicated grief,  atrial fibrillation,  Systolic dysfunction and deconditioning contributing.  Recommended cardiopulmonary rehab at last visit and she declined.  encouraged to start a walking program   Lab Results  Component Value Date   WBC 5.3 12/11/2019   HGB 13.0 12/11/2019   HCT 39.6 12/11/2019   MCV 91.0  12/11/2019   PLT 220.0 12/11/2019   Lab Results  Component Value Date   TSH 1.85 12/11/2019         Generalized anxiety disorder    She did not tolerate trial of paxil but prefers to use alprazolam prn.  Continueremeron. The risks and benefits of benzodiazepine use were reviewed with patient today including excessive sedation leading to respiratory depression,  impaired thinking/driving, and addiction.  Patient was advised to avoid concurrent use with alcohol, to use medication only as needed and not to share with others  .       B12 deficiency    Intrinsic Factor antibody negative. Continue oral supplementation ,  Recheck level at next draw given depression   Lab Results  Component Value Date   VITAMINB12 253 09/11/2018         Abdominal aortic atherosclerosis [facility])    Reviewed findings of prior CT  scan today..  Patient is  Not willing to  Initiate statin therpay given her past intolerance due to severe myalgias.       Relevant Medications   amLODipine (NORVASC) 2.5 MG tablet   Hypothyroidism due to acquired atrophy of thyroid    Thyroid function is WNL on current dose of 50 mcg daily .  No current changes needed.   Lab Results  Component Value Date   TSH 1.85 12/11/2019         Relevant Orders   TSH (Completed)   CHF (congestive heart failure) ([place])    Presumed non ischemic by 2020 myoview, with mild LVH , puilmonary hypertension and mild mitral valve regurgitation. Advised patient to continue to monitor wt daily and double the lasix dose for overnight weight gain of 2 lbs or weekly weight gain of 5 lbs.       Relevant Medications   amLODipine (NORVASC) 2.5 MG tablet   Essential hypertension    Elevated today due to medication nonadherence .  Advised to resume amlodipine and RTC one week  for BP check      Relevant Medications   amLODipine (NORVASC) 2.5 MG tablet   Schatzki's ring of distal esophagus    Continue PPI,    Repeat EGD planned for Nov 9         Other Visit Diagnoses    Long term current use of anticoagulant therapy       Relevant Orders   IBC + Ferritin (Completed)   CBC with Differential/Platelet (Completed)      I have changed Blanch Media A. [place]'s amLODipine. I am also having her maintain her furosemide, levothyroxine, Xarelto, mirtazapine, and pantoprazole.  Meds ordered this encounter  Medications  . amLODipine (NORVASC) 2.5 MG tablet    Sig: Take 1 tablet (2.5 mg total) by mouth daily.    Dispense:  30 tablet    Refill:  11    Medications Discontinued During This Encounter  Medication Reason  . amLODipine (NORVASC) 2.5 MG tablet Reorder    Follow-up: No follow-ups on file.   [name], MD

## 2019-12-11 NOTE — Patient Instructions (Addendum)
You should NOT take Advil, Aleve or Motrin  Because they can hurt your stomach   You can take  up to 2000 mg of acetominophen (tylenol) every day safely  In divided doses (500 mg every 6 hours  Or 1000 mg every 12 hours.)  Your blood pressure is high today but low at home:  Do not Start back on amlodipine for blood pressure UNTIL  You come in with your BP machine for a RN visit    Go ahead and resume mirtazipine for insomnia and low appetite  Pantoprazole is for your stomach,  Take daily in the morning .  This will be for life.     The alprazolam is for your nerves .  Take it SPARINGLY,  NOT DAILY      If you see blood when you wipe after a stool, it's because your hemorrhoids is bleeding.  You can start taking miralax , colace,  Or occasional magnesium at night  (just one of these will do!)  You do have internal hemorrhoids,

## 2019-12-12 MED ORDER — AMLODIPINE BESYLATE 2.5 MG PO TABS: 2.5000 mg | ORAL_TABLET | Freq: Every day | ORAL | 11 refills | Status: DC

## 2019-12-12 NOTE — Assessment & Plan Note (Signed)
She did not tolerate trial of paxil but prefers to use alprazolam prn.  Continueremeron. The risks and benefits of benzodiazepine use were reviewed with patient today including excessive sedation leading to respiratory depression,  impaired thinking/driving, and addiction.  Patient was advised to avoid concurrent use with alcohol, to use medication only as needed and not to share with others  .

## 2019-12-12 NOTE — Assessment & Plan Note (Addendum)
Thyroid function is WNL on current dose of 50 mcg daily .  No current changes needed.   Lab Results  Component Value Date   TSH 1.85 12/11/2019

## 2019-12-12 NOTE — Assessment & Plan Note (Signed)
Intrinsic Factor antibody negative. Continue oral supplementation ,  Recheck level at next draw given depression   Lab Results  Component Value Date   VITAMINB12 253 09/11/2018

## 2019-12-12 NOTE — Assessment & Plan Note (Signed)
Reviewed findings of prior CT scan today..  Patient is  Not willing to  Initiate statin therpay given her past intolerance due to severe myalgias.

## 2019-12-12 NOTE — Assessment & Plan Note (Signed)
Presumed non ischemic by 2020 myoview, with mild LVH , puilmonary hypertension and mild mitral valve regurgitation. Advised patient to continue to monitor wt daily and double the lasix dose for overnight weight gain of 2 lbs or weekly weight gain of 5 lbs.

## 2019-12-12 NOTE — Assessment & Plan Note (Signed)
Continue PPI,    Repeat EGD planned for Nov 9

## 2019-12-12 NOTE — Progress Notes (Signed)
Your Labs are normal or as expected.  No medication changes are needed  Regards,   Deborra Medina, MD

## 2019-12-12 NOTE — Assessment & Plan Note (Signed)
With atherosclerotic CVD ,  Untreated due to patient 's history of statin myalgias.

## 2019-12-12 NOTE — Assessment & Plan Note (Signed)
Elevated today due to medication nonadherence .  Advised to resume amlodipine and RTC one week  for BP check

## 2019-12-12 NOTE — Assessment & Plan Note (Addendum)
Screening labs normal.  No history of snoring.  Multifactorial , with complicated grief,  atrial fibrillation,  Systolic dysfunction and deconditioning contributing.  Recommended cardiopulmonary rehab at last visit and she declined.  encouraged to start a walking program   Lab Results  Component Value Date   WBC 5.3 12/11/2019   HGB 13.0 12/11/2019   HCT 39.6 12/11/2019   MCV 91.0 12/11/2019   PLT 220.0 12/11/2019   Lab Results  Component Value Date   TSH 1.85 12/11/2019

## 2019-12-17 DIAGNOSIS — I6523 Occlusion and stenosis of bilateral carotid arteries: Secondary | ICD-10-CM | POA: Diagnosis not present

## 2019-12-17 DIAGNOSIS — R0989 Other specified symptoms and signs involving the circulatory and respiratory systems: Secondary | ICD-10-CM | POA: Diagnosis not present

## 2019-12-20 ENCOUNTER — Other Ambulatory Visit: Payer: Self-pay

## 2019-12-20 ENCOUNTER — Other Ambulatory Visit
Admission: RE | Admit: 2019-12-20 | Discharge: 2019-12-20 | Disposition: A | Discharge status: Home / Self Care | Payer: Medicare HMO | Source: Ambulatory Visit | Attending: Gastroenterology | Admitting: Gastroenterology

## 2019-12-20 DIAGNOSIS — Z20822 Contact with and (suspected) exposure to covid-19: Secondary | ICD-10-CM | POA: Insufficient documentation

## 2019-12-20 DIAGNOSIS — Z01812 Encounter for preprocedural laboratory examination: Secondary | ICD-10-CM | POA: Diagnosis not present

## 2019-12-20 LAB — SARS CORONAVIRUS 2 (TAT 6-24 HRS): SARS Coronavirus 2: NEGATIVE

## 2019-12-23 DIAGNOSIS — I700 Atherosclerosis of aorta: Secondary | ICD-10-CM | POA: Diagnosis not present

## 2019-12-23 DIAGNOSIS — I050 Rheumatic mitral stenosis: Secondary | ICD-10-CM | POA: Diagnosis not present

## 2019-12-23 DIAGNOSIS — I480 Paroxysmal atrial fibrillation: Secondary | ICD-10-CM | POA: Diagnosis not present

## 2019-12-23 DIAGNOSIS — I6523 Occlusion and stenosis of bilateral carotid arteries: Secondary | ICD-10-CM | POA: Insufficient documentation

## 2019-12-23 DIAGNOSIS — E782 Mixed hyperlipidemia: Secondary | ICD-10-CM | POA: Diagnosis not present

## 2019-12-24 ENCOUNTER — Encounter: Payer: Self-pay | Admitting: *Deleted

## 2019-12-24 ENCOUNTER — Encounter
Admission: RE | Disposition: A | Discharge status: Home / Self Care | Payer: Self-pay | Source: Home / Self Care | Attending: Gastroenterology

## 2019-12-24 ENCOUNTER — Other Ambulatory Visit: Payer: Self-pay

## 2019-12-24 ENCOUNTER — Ambulatory Visit: Payer: Medicare HMO | Admitting: Certified Registered Nurse Anesthetist

## 2019-12-24 ENCOUNTER — Ambulatory Visit
Admission: RE | Admit: 2019-12-24 | Discharge: 2019-12-24 | Disposition: A | Discharge status: Home / Self Care | Payer: Medicare HMO | Attending: Gastroenterology | Admitting: Gastroenterology

## 2019-12-24 DIAGNOSIS — R1310 Dysphagia, unspecified: Secondary | ICD-10-CM | POA: Insufficient documentation

## 2019-12-24 DIAGNOSIS — Z7901 Long term (current) use of anticoagulants: Secondary | ICD-10-CM | POA: Diagnosis not present

## 2019-12-24 DIAGNOSIS — E785 Hyperlipidemia, unspecified: Secondary | ICD-10-CM | POA: Diagnosis not present

## 2019-12-24 DIAGNOSIS — Z86010 Personal history of colonic polyps: Secondary | ICD-10-CM | POA: Diagnosis not present

## 2019-12-24 DIAGNOSIS — K317 Polyp of stomach and duodenum: Secondary | ICD-10-CM | POA: Insufficient documentation

## 2019-12-24 DIAGNOSIS — I4891 Unspecified atrial fibrillation: Secondary | ICD-10-CM | POA: Diagnosis not present

## 2019-12-24 DIAGNOSIS — K319 Disease of stomach and duodenum, unspecified: Secondary | ICD-10-CM | POA: Insufficient documentation

## 2019-12-24 DIAGNOSIS — I10 Essential (primary) hypertension: Secondary | ICD-10-CM | POA: Insufficient documentation

## 2019-12-24 DIAGNOSIS — K219 Gastro-esophageal reflux disease without esophagitis: Secondary | ICD-10-CM | POA: Diagnosis not present

## 2019-12-24 DIAGNOSIS — K222 Esophageal obstruction: Secondary | ICD-10-CM | POA: Diagnosis not present

## 2019-12-24 DIAGNOSIS — Z888 Allergy status to other drugs, medicaments and biological substances status: Secondary | ICD-10-CM | POA: Insufficient documentation

## 2019-12-24 DIAGNOSIS — Z86711 Personal history of pulmonary embolism: Secondary | ICD-10-CM | POA: Diagnosis not present

## 2019-12-24 DIAGNOSIS — D131 Benign neoplasm of stomach: Secondary | ICD-10-CM | POA: Diagnosis not present

## 2019-12-24 DIAGNOSIS — Z79899 Other long term (current) drug therapy: Secondary | ICD-10-CM | POA: Insufficient documentation

## 2019-12-24 DIAGNOSIS — I509 Heart failure, unspecified: Secondary | ICD-10-CM | POA: Diagnosis not present

## 2019-12-24 DIAGNOSIS — Z791 Long term (current) use of non-steroidal anti-inflammatories (NSAID): Secondary | ICD-10-CM | POA: Diagnosis not present

## 2019-12-24 DIAGNOSIS — I110 Hypertensive heart disease with heart failure: Secondary | ICD-10-CM | POA: Diagnosis not present

## 2019-12-24 DIAGNOSIS — K2100 Gastro-esophageal reflux disease with esophagitis, without bleeding: Secondary | ICD-10-CM | POA: Diagnosis not present

## 2019-12-24 DIAGNOSIS — E034 Atrophy of thyroid (acquired): Secondary | ICD-10-CM | POA: Diagnosis not present

## 2019-12-24 HISTORY — DX: Supraventricular tachycardia, unspecified: I47.10

## 2019-12-24 HISTORY — DX: Hypothyroidism, unspecified: E03.9

## 2019-12-24 HISTORY — DX: Unspecified macular degeneration: H35.30

## 2019-12-24 HISTORY — DX: Essential (primary) hypertension: I10

## 2019-12-24 HISTORY — PX: ESOPHAGOGASTRODUODENOSCOPY (EGD) WITH PROPOFOL: SHX5813

## 2019-12-24 HISTORY — DX: Supraventricular tachycardia: I47.1

## 2019-12-24 HISTORY — DX: Benign neoplasm of colon, unspecified: D12.6

## 2019-12-24 SURGERY — ESOPHAGOGASTRODUODENOSCOPY (EGD) WITH PROPOFOL
Anesthesia: General

## 2019-12-24 MED ORDER — GLYCOPYRROLATE 0.2 MG/ML IJ SOLN
INTRAMUSCULAR | Status: DC | PRN
  Administered 2019-12-24: .2 mg via INTRAVENOUS

## 2019-12-24 MED ORDER — PROPOFOL 10 MG/ML IV BOLUS
INTRAVENOUS | Status: DC | PRN
  Administered 2019-12-24: 20 mg via INTRAVENOUS
  Administered 2019-12-24 (×2): 10 mg via INTRAVENOUS

## 2019-12-24 MED ORDER — SODIUM CHLORIDE 0.9 % IV SOLN: INTRAVENOUS | Status: DC

## 2019-12-24 MED ORDER — LIDOCAINE HCL (CARDIAC) PF 100 MG/5ML IV SOSY
PREFILLED_SYRINGE | INTRAVENOUS | Status: DC | PRN
  Administered 2019-12-24: 50 mg via INTRAVENOUS

## 2019-12-24 MED ORDER — PROPOFOL 500 MG/50ML IV EMUL
INTRAVENOUS | Status: AC
  Filled 2019-12-24: qty 50

## 2019-12-24 MED ORDER — PROPOFOL 500 MG/50ML IV EMUL
INTRAVENOUS | Status: DC | PRN
  Administered 2019-12-24: 100 ug/kg/min via INTRAVENOUS

## 2019-12-24 NOTE — Anesthesia Postprocedure Evaluation (Signed)
Anesthesia Post Note  Patient: Frances Kent  Procedure(s) Performed: ESOPHAGOGASTRODUODENOSCOPY (EGD) WITH PROPOFOL (N/A )  Patient location during evaluation: Endoscopy Anesthesia Type: General Level of consciousness: awake and awake and alert Pain management: pain level controlled Vital Signs Assessment: post-procedure vital signs reviewed and stable Respiratory status: spontaneous breathing and nonlabored ventilation Cardiovascular status: blood pressure returned to baseline Postop Assessment: no headache and no apparent nausea or vomiting Anesthetic complications: no   No complications documented.   Last Vitals:  Vitals:   12/24/19 1010 12/24/19 1049  BP: (!) 188/76 (!) 154/76  Pulse: 69 84  Resp: 18 (!) 21  Temp: 36.9 C 37 C  SpO2: 99% 97%    Last Pain:  Vitals:   12/24/19 1049  TempSrc:   PainSc: 0-No pain                 Neva Seat

## 2019-12-24 NOTE — H&P (Signed)
Outpatient short stay form Pre-procedure 12/24/2019 10:28 AM Raylene Miyamoto MD, MPH  Primary Physician: Dr. Derrel Nip  Reason for visit:  History of food bolus  History of present illness:   86 y/o lady with history of food bolus here for follow-up EGD. She last took xarelto on Sunday morning. GFR is 45. Patient denies any dysphagia at this time.    Current Facility-Administered Medications:  .  0.9 %  sodium chloride infusion, , Intravenous, Continuous, Duwane Gewirtz, Hilton Cork, MD, Last Rate: 20 mL/hr at 12/24/19 1021, New Bag at 12/24/19 1021  Medications Prior to Admission  Medication Sig Dispense Refill Last Dose  . amiodarone (PACERONE) 200 MG tablet Take 200 mg by mouth daily.   12/23/2019 at 0730  . amoxicillin (AMOXIL) 500 MG tablet Take 2,000 mg by mouth once. Prior to dentist appt     . Bevacizumab (AVASTIN IV) Inject 25 mg/mL into the vein. Every nine weeks   Past Month at Unknown time  . furosemide (LASIX) 20 MG tablet Take 1 tablet (20 mg total) by mouth daily. As needed for overnight weight gain 30 tablet 1 Past Month at Unknown time  . levothyroxine (EUTHYROX) 50 MCG tablet TAKE 1 TABLET BY MOUTH ONCE DAILY TAKE  30  MINUTES  PRIOR  TO  EATING 90 tablet 0 12/23/2019 at Unknown time  . pantoprazole (PROTONIX) 20 MG tablet    12/23/2019 at Unknown time  . amLODipine (NORVASC) 2.5 MG tablet Take 1 tablet (2.5 mg total) by mouth daily. (Patient not taking: Reported on 12/24/2019) 30 tablet 11 Not Taking at Unknown time  . diclofenac Sodium (VOLTAREN) 1 % GEL Apply topically 4 (four) times daily. (Patient not taking: Reported on 12/24/2019)   Not Taking at Unknown time  . mirtazapine (REMERON) 15 MG tablet 1/2 to 1 tablet 30 minutes before bedtime daily (Patient not taking: Reported on 12/24/2019) 10 tablet 0 Not Taking at Unknown time  . potassium chloride (KLOR-CON) 10 MEQ tablet Take 10 mEq by mouth 2 (two) times daily. (Patient not taking: Reported on 12/24/2019)   Not Taking at Unknown  time  . XARELTO 15 MG TABS tablet Take 1 tablet by mouth once daily 90 tablet 0 12/22/19     Allergies  Allergen Reactions  . Statins     Severe myalgias     Past Medical History:  Diagnosis Date  . Atrial fibrillation ([place])   . Colon adenomas   . GERD (gastroesophageal reflux disease)   . Hypertension   . Hypothyroidism   . Macular degeneration   . Mitral regurgitation   . Pulmonary embolism ([place]) 02/2016  . SVT (supraventricular tachycardia) ([place])     Review of systems:  Otherwise negative.    Physical Exam  Gen: Alert, oriented. Appears stated age.  HEENT: PERRLA. Lungs: No respiratory distress CV: RRR Abd: soft, benign, no masses.  Ext: No edema.    Planned procedures: Proceed with EGD. The patient understands the nature of the planned procedure, indications, risks, alternatives and potential complications including but not limited to bleeding, infection, perforation, damage to internal organs and possible oversedation/side effects from anesthesia. The patient agrees and gives consent to proceed.  Please refer to procedure notes for findings, recommendations and patient disposition/instructions.     Raylene Miyamoto MD, MPH Gastroenterology 12/24/2019  10:28 AM

## 2019-12-24 NOTE — Interval H&P Note (Signed)
History and Physical Interval Note:  12/24/2019 10:31 AM  Frances Kent  has presented today for surgery, with the diagnosis of Schatzki Ring.  The various methods of treatment have been discussed with the patient and family. After consideration of risks, benefits and other options for treatment, the patient has consented to  Procedure(s): ESOPHAGOGASTRODUODENOSCOPY (EGD) WITH PROPOFOL (N/A) as a surgical intervention.  The patient's history has been reviewed, patient examined, no change in status, stable for surgery.  I have reviewed the patient's chart and labs.  Questions were answered to the patient's satisfaction.     Lesly Rubenstein  Ok to proceed with EGD.

## 2019-12-24 NOTE — Transfer of Care (Signed)
Immediate Anesthesia Transfer of Care Note  Patient: Frances Kent  Procedure(s) Performed: ESOPHAGOGASTRODUODENOSCOPY (EGD) WITH PROPOFOL (N/A )  Patient Location: PACU and Endoscopy Unit  Anesthesia Type:General  Level of Consciousness: drowsy  Airway & Oxygen Therapy: Patient Spontanous Breathing  Post-op Assessment: Report given to RN and Post -op Vital signs reviewed and stable  Post vital signs: Reviewed and stable  Last Vitals:  Vitals Value Taken Time  BP 154/76 12/24/19 1049  Temp 37 C 12/24/19 1049  Pulse 81 12/24/19 1051  Resp 19 12/24/19 1051  SpO2 97 % 12/24/19 1051  Vitals shown include unvalidated device data.  Last Pain:  Vitals:   12/24/19 1010  TempSrc: Oral  PainSc: 0-No pain         Complications: No complications documented.

## 2019-12-24 NOTE — Anesthesia Preprocedure Evaluation (Signed)
Anesthesia Evaluation  Patient identified by MRN, date of birth, ID band Patient awake    Reviewed: Allergy & Precautions, NPO status , Patient's Chart, lab work & pertinent test results  Airway Mallampati: II       Dental no notable dental hx.    Pulmonary neg pulmonary ROS,    Pulmonary exam normal breath sounds clear to auscultation       Cardiovascular hypertension, +CHF   + Diastolic murmurs    Neuro/Psych PSYCHIATRIC DISORDERS Anxiety  Neuromuscular disease    GI/Hepatic negative GI ROS, Neg liver ROS,   Endo/Other  Hypothyroidism   Renal/GU Renal disease  negative genitourinary   Musculoskeletal  (+) Arthritis ,   Abdominal   Peds negative pediatric ROS (+)  Hematology negative hematology ROS (+)   Anesthesia Other Findings   Reproductive/Obstetrics negative OB ROS                             Anesthesia Physical Anesthesia Plan  ASA: III  Anesthesia Plan: General   Post-op Pain Management:    Induction:   PONV Risk Score and Plan: 3 and Propofol infusion  Airway Management Planned: Nasal Cannula  Additional Equipment: None  Intra-op Plan:   Post-operative Plan:   Informed Consent: I have reviewed the patients History and Physical, chart, labs and discussed the procedure including the risks, benefits and alternatives for the proposed anesthesia with the patient or authorized representative who has indicated his/her understanding and acceptance.       Plan Discussed with: CRNA, Anesthesiologist and Surgeon  Anesthesia Plan Comments:         Anesthesia Quick Evaluation

## 2019-12-24 NOTE — Op Note (Signed)
[facility] Gastroenterology Patient Name: Frances Kent Procedure Date: 12/24/2019 10:21 AM MRN: [id number] Account #: [id number] Date of Birth: 19-Aug-1930 Admit Type: Outpatient Age: 86 Room: [facility] ENDO ROOM 1 Gender: Female Note Status: Finalized Procedure:             Upper GI endoscopy Indications:           Dysphagia Providers:             Andrey Farmer MD, MD Referring MD:          Deborra Medina, MD (Referring MD) Medicines:             Monitored Anesthesia Care Complications:         No immediate complications. Estimated blood loss:                         Minimal. Procedure:             Pre-Anesthesia Assessment:                        - Prior to the procedure, a History and Physical was                         performed, and patient medications and allergies were                         reviewed. The patient is competent. The risks and                         benefits of the procedure and the sedation options and                         risks were discussed with the patient. All questions                         were answered and informed consent was obtained.                         Patient identification and proposed procedure were                         verified by the physician, the nurse, the anesthetist                         and the technician in the endoscopy suite. Mental                         Status Examination: alert and oriented. Airway                         Examination: normal oropharyngeal airway and neck                         mobility. Respiratory Examination: clear to                         auscultation. CV Examination: normal. Prophylactic                         Antibiotics: The  patient does not require prophylactic                         antibiotics. Prior Anticoagulants: The patient has                         taken Xarelto (rivaroxaban), last dose was 2 days                         prior to procedure. ASA Grade  Assessment: III - A                         patient with severe systemic disease. After reviewing                         the risks and benefits, the patient was deemed in                         satisfactory condition to undergo the procedure. The                         anesthesia plan was to use monitored anesthesia care                         (MAC). Immediately prior to administration of                         medications, the patient was re-assessed for adequacy                         to receive sedatives. The heart rate, respiratory                         rate, oxygen saturations, blood pressure, adequacy of                         pulmonary ventilation, and response to care were                         monitored throughout the procedure. The physical                         status of the patient was re-assessed after the                         procedure.                        After obtaining informed consent, the endoscope was                         passed under direct vision. Throughout the procedure,                         the patient's blood pressure, pulse, and oxygen                         saturations were monitored continuously. The Endoscope  was introduced through the mouth, and advanced to the                         second part of duodenum. The upper GI endoscopy was                         accomplished without difficulty. The patient tolerated                         the procedure well. Findings:      The examined esophagus was normal. No evidence of previously noted       Schatzki ring. Biopsies were obtained from the proximal and distal       esophagus with cold forceps for histology of suspected eosinophilic       esophagitis. Estimated blood loss was minimal.      A single 3 mm sessile polyp with no stigmata of recent bleeding was       found in the gastric fundus. The polyp was removed with a cold snare.       Resection and retrieval  were complete. Estimated blood loss was minimal.      Diffuse moderately erythematous mucosa without bleeding was found in the       gastric antrum. This was biopsied two weeks ago and was negative so it       was not rebiopsied.      The examined duodenum was normal. Impression:            - Normal esophagus. Biopsied.                        - A single gastric polyp. Resected and retrieved.                        - Erythematous mucosa in the antrum.                        - Normal examined duodenum. Recommendation:        - Discharge patient to home.                        - Resume previous diet.                        - Continue present medications.                        - Await pathology results.                        - Return to referring physician as previously                         scheduled.                        - Resume Xarelto (rivaroxaban) at prior dose tomorrow. Procedure Code(s):     --- Professional ---                        [phone], Esophagogastroduodenoscopy, flexible,  transoral; with removal of tumor(s), polyp(s), or                         other lesion(s) by snare technique Diagnosis Code(s):     --- Professional ---                        K31.7, Polyp of stomach and duodenum                        K31.89, Other diseases of stomach and duodenum                        R13.10, Dysphagia, unspecified CPT copyright 2019 American Medical Association. All rights reserved. The codes documented in this report are preliminary and upon coder review may  be revised to meet current compliance requirements. Andrey Farmer, MD Andrey Farmer MD, MD 12/24/2019 10:49:38 AM Number of Addenda: 0 Note Initiated On: 12/24/2019 10:21 AM Estimated Blood Loss:  Estimated blood loss was minimal.      [facility]

## 2019-12-24 NOTE — Anesthesia Procedure Notes (Signed)
Procedure Name: MAC Date/Time: 12/24/2019 10:44 AM Performed by: Lily Peer, Afra Tricarico, CRNA Pre-anesthesia Checklist: Patient identified, Emergency Drugs available, Suction available and Patient being monitored Patient Re-evaluated:Patient Re-evaluated prior to induction Oxygen Delivery Method: Nasal cannula

## 2019-12-25 ENCOUNTER — Encounter: Payer: Self-pay | Admitting: Gastroenterology

## 2019-12-25 LAB — SURGICAL PATHOLOGY

## 2020-01-21 DIAGNOSIS — H353221 Exudative age-related macular degeneration, left eye, with active choroidal neovascularization: Secondary | ICD-10-CM | POA: Diagnosis not present

## 2020-01-28 DIAGNOSIS — H353212 Exudative age-related macular degeneration, right eye, with inactive choroidal neovascularization: Secondary | ICD-10-CM | POA: Diagnosis not present

## 2020-02-28 ENCOUNTER — Telehealth: Payer: Self-pay | Admitting: Internal Medicine

## 2020-02-28 NOTE — Telephone Encounter (Signed)
Spoken to patient. She stated she is very tired, chest congestion, cough, loose stools, and loss of appetite, No loss of taste and smell, fever, chills, SOB, and heart palpitations. SX have been going on for 7 days. Appointment has been scheduled virtually.

## 2020-02-28 NOTE — Telephone Encounter (Signed)
Patient called in stated that she has congestion ,cough diarrhea no available appt

## 2020-02-28 NOTE — Telephone Encounter (Signed)
Patient has been notified. patient will call with results.

## 2020-02-28 NOTE — Telephone Encounter (Signed)
Has she  Been tested for COVID 19?  IF NOT,  SHE NEEDS TO BE TESTED

## 2020-03-03 ENCOUNTER — Telehealth (INDEPENDENT_AMBULATORY_CARE_PROVIDER_SITE_OTHER): Payer: Medicare HMO | Admitting: Internal Medicine

## 2020-03-03 ENCOUNTER — Encounter: Payer: Self-pay | Admitting: Internal Medicine

## 2020-03-03 ENCOUNTER — Other Ambulatory Visit (INDEPENDENT_AMBULATORY_CARE_PROVIDER_SITE_OTHER): Payer: Medicare HMO

## 2020-03-03 ENCOUNTER — Other Ambulatory Visit: Payer: Self-pay

## 2020-03-03 VITALS — Ht 63.0 in | Wt 103.0 lb

## 2020-03-03 DIAGNOSIS — R197 Diarrhea, unspecified: Secondary | ICD-10-CM

## 2020-03-03 DIAGNOSIS — Z8616 Personal history of COVID-19: Secondary | ICD-10-CM | POA: Insufficient documentation

## 2020-03-03 DIAGNOSIS — Z20822 Contact with and (suspected) exposure to covid-19: Secondary | ICD-10-CM | POA: Diagnosis not present

## 2020-03-03 LAB — POCT INFLUENZA A/B
Influenza A, POC: NEGATIVE
Influenza B, POC: NEGATIVE

## 2020-03-03 MED ORDER — BENZONATATE 200 MG PO CAPS: 200.0000 mg | ORAL_CAPSULE | Freq: Three times a day (TID) | ORAL | 1 refills | Status: DC | PRN

## 2020-03-03 MED ORDER — AZITHROMYCIN 500 MG PO TABS: 500.0000 mg | ORAL_TABLET | Freq: Every day | ORAL | 0 refills | Status: DC

## 2020-03-03 MED ORDER — PREDNISONE 10 MG PO TABS: ORAL_TABLET | ORAL | 0 refills | Status: DC

## 2020-03-03 NOTE — Patient Instructions (Signed)
Your appointment for COVID TEST  IS AT 3:15 TODAY BEHIND OUR [place] OFFICE  .I am sending 3 prescriptions to your pharmacy  Prednisone in a tapering  dose  Take 6 tablets all at once on day 1,  Then 5 tablets all at once on day 2,  Etc (reduce dose by 1 tablet daily until gone)   Azithromycin 500 mg daily for 7 days  Benzonatate cough capsules  Every 8 hours as needed for cough.  IF THIS MEDICATION IS TOO EXPENSIVE, you can use Mucinex DM available OTC>      iF YOU  have  a positive COVID TEST  :  1) Monitor your oxygen level using a  pulse oximeter *( you can buy this at [facility]).  If your oxygen level falls below 90% and stays there,  Return to the ER  For urgent assistance.    2) continue isolation for a minimum of 10 days FROM last Friday (until Jan 24) .  All symptoms should be improving or resolved in order to break quarantine    3) you must be fever free without the use of tylenol, aleve or motrin for 72 hours  before breaking isolation  and/or returning to work .

## 2020-03-03 NOTE — Assessment & Plan Note (Addendum)
Signs and symptomss highly suggestive.  Testing today.  Supportive care and isolation precautions outlined.  She is at high risk because she is unvaccinated and has a history of CHF and CKD

## 2020-03-03 NOTE — Progress Notes (Signed)
Virtual Visit converted to telephone  Note  This visit type was conducted due to national recommendations for restrictions regarding the COVID-19 pandemic (e.g. social distancing).  This format is felt to be most appropriate for this patient at this time.  All issues noted in this document were discussed and addressed.  No physical exam was performed (except for noted visual exam findings with Video Visits).   I connected with@ on 03/03/20 at  2:00 PM EST by telephone and verified that I am speaking with the correct person using two identifiers. Location patient: home Location provider: work or home office Persons participating in the virtual visit: patient, provider  I discussed the limitations, risks, security and privacy concerns of performing an evaluation and management service by telephone and the availability of in person appointments. I also discussed with the patient that there may be a patient responsible charge related to this service. The patient expressed understanding and agreed to proceed.  Interactive audio and video telecommunications were attempted between this provider and patient, however failed, due to patient  did not have access to video capability.  We continued and completed visit with audio only.    Reason for visit:  Fatigue, diarrhea, cough  HPI:  87 yr old female with CHF ef 45%,  GERD with esophagitis,  Recent EGD nov 21  Symptoms of malaise started last Friday Jan 15  Extreme fatigue,  Initially a sore throat, and loose stools occurring   4-5 daily initially,  3-4 daily currently .. denies nausea, but reports dysgeusia,  And reports no appetite . Drinking ice water and orange juice , has eaten very little else.  great granddaughter  Stays with her during the day; she is alone at night    ROS: See pertinent positives and negatives per HPI.  Past Medical History:  Diagnosis Date  . Atrial fibrillation ([place])   . Colon adenomas   . GERD (gastroesophageal reflux  disease)   . Hypertension   . Hypothyroidism   . Macular degeneration   . Mitral regurgitation   . Pulmonary embolism ([place]) 02/2016  . SVT (supraventricular tachycardia) ([facility])     Past Surgical History:  Procedure Laterality Date  . APPENDECTOMY  1960  . BREAST CYST ASPIRATION Left 2017  . CARDIOVERSION N/A 04/05/2016   Procedure: Cardioversion;  Surgeon: [name], MD;  Location: [facility] ORS;  Service: Cardiovascular;  Laterality: N/A;  . CHOLECYSTECTOMY  1985  . ESOPHAGOGASTRODUODENOSCOPY (EGD) WITH PROPOFOL N/A 11/27/2019   Procedure: ESOPHAGOGASTRODUODENOSCOPY (EGD) WITH PROPOFOL;  Surgeon: Lesly Rubenstein, MD;  Location: [facility] ENDOSCOPY;  Service: Endoscopy;  Laterality: N/A;  . ESOPHAGOGASTRODUODENOSCOPY (EGD) WITH PROPOFOL N/A 12/24/2019   Procedure: ESOPHAGOGASTRODUODENOSCOPY (EGD) WITH PROPOFOL;  Surgeon: Lesly Rubenstein, MD;  Location: [facility] ENDOSCOPY;  Service: Endoscopy;  Laterality: N/A;  . OVARIAN CYST REMOVAL    . TEE WITHOUT CARDIOVERSION N/A 02/01/2018   Procedure: TRANSESOPHAGEAL ECHOCARDIOGRAM (TEE);  Surgeon: [name], MD;  Location: [facility] ORS;  Service: Cardiovascular;  Laterality: N/A;    Family History  Problem Relation Age of Onset  . Cancer Mother 17       breast cancer, lived to 29,   . Breast cancer Mother 75  . Cancer Son 65       pancreatic cancer  . Heart disease Son        CAD, Tobacco Abuse   . Heart disease Maternal Grandmother 78       died of massive MI  . Stroke Sister  SOCIAL HX:  reports that she has never smoked. She has never used smokeless tobacco. She reports that she does not drink alcohol and does not use drugs.  Current Outpatient Medications:  .  amiodarone (PACERONE) 200 MG tablet, Take 200 mg by mouth daily., Disp: , Rfl:  .  azithromycin (ZITHROMAX) 500 MG tablet, Take 1 tablet (500 mg total) by mouth daily., Disp: 7 tablet, Rfl: 0 .  benzonatate (TESSALON) 200 MG capsule, Take 1 capsule (200 mg total) by  mouth 3 (three) times daily as needed for cough., Disp: 60 capsule, Rfl: 1 .  Bevacizumab (AVASTIN IV), Inject 25 mg/mL into the vein. Every nine weeks, Disp: , Rfl:  .  levothyroxine (EUTHYROX) 50 MCG tablet, TAKE 1 TABLET BY MOUTH ONCE DAILY TAKE  30  MINUTES  PRIOR  TO  EATING, Disp: 90 tablet, Rfl: 0 .  mirtazapine (REMERON) 15 MG tablet, 1/2 to 1 tablet 30 minutes before bedtime daily, Disp: 10 tablet, Rfl: 0 .  pantoprazole (PROTONIX) 20 MG tablet, Take 20 mg by mouth daily., Disp: , Rfl:  .  predniSONE (DELTASONE) 10 MG tablet, 6 tablets on Day 1 , then reduce by 1 tablet daily until gone, Disp: 21 tablet, Rfl: 0 .  XARELTO 15 MG TABS tablet, Take 1 tablet by mouth once daily, Disp: 90 tablet, Rfl: 0 .  amoxicillin (AMOXIL) 500 MG tablet, Take 2,000 mg by mouth once. Prior to dentist appt (Patient not taking: Reported on 03/03/2020), Disp: , Rfl:  .  furosemide (LASIX) 20 MG tablet, Take 1 tablet (20 mg total) by mouth daily. As needed for overnight weight gain (Patient not taking: Reported on 03/03/2020), Disp: 30 tablet, Rfl: 1 .  potassium chloride (KLOR-CON) 10 MEQ tablet, Take 10 mEq by mouth 2 (two) times daily. (Patient not taking: No sig reported), Disp: , Rfl:   EXAM:  VITALS per patient if applicable:  GENERAL: alert, oriented, appears well and in no acute distress  HEENT: atraumatic, conjunttiva clear, no obvious abnormalities on inspection of external nose and ears  NECK: normal movements of the head and neck  LUNGS: on inspection no signs of respiratory distress, breathing rate appears normal, no obvious gross SOB, gasping or wheezing  CV: no obvious cyanosis  MS: moves all visible extremities without noticeable abnormality  PSYCH/NEURO: pleasant and cooperative, no obvious depression or anxiety, speech and thought processing grossly intact  ASSESSMENT AND PLAN:  Discussed the following assessment and plan:  Diarrhea, unspecified type - Plan: Novel Coronavirus, NAA  ([facility]), POCT Influenza A/B  Suspected COVID-19 virus infection  Suspected COVID-19 virus infection Signs and symptomss highly suggestive.  Testing today.  Supportive care and isolation precautions outlined.  She is at high risk because she is unvaccinated and has a history of CHF and CKD     I discussed the assessment and treatment plan with the patient. The patient was provided an opportunity to ask questions and all were answered. The patient agreed with the plan and demonstrated an understanding of the instructions.   The patient was advised to call back or seek an in-person evaluation if the symptoms worsen or if the condition fails to improve as anticipated.  I provided 30 minutes of non-face-to-face time during this encounter.   [name], MD

## 2020-03-05 LAB — NOVEL CORONAVIRUS, NAA: SARS-CoV-2, NAA: DETECTED — AB

## 2020-03-05 LAB — SARS-COV-2, NAA 2 DAY TAT

## 2020-03-05 NOTE — Progress Notes (Signed)
Her covid test was positive.  Patient contacted by Dr Derrel Nip.  She is feeling better: appetite and cough are improving  advised to isolate until Jan 28

## 2020-03-06 ENCOUNTER — Telehealth: Payer: Medicare HMO | Admitting: Internal Medicine

## 2020-03-06 ENCOUNTER — Telehealth: Payer: Self-pay

## 2020-03-06 NOTE — Telephone Encounter (Signed)
Called to discuss with patient about COVID-19 symptoms and the use of one of the available treatments for those with mild to moderate Covid symptoms and at a high risk of hospitalization.  Pt appears to qualify for outpatient treatment due to co-morbid conditions and/or a member of an at-risk group in accordance with the FDA Emergency Use Authorization.    Symptom onset: 02/27/20 Vaccinated: No Booster? No Immunocompromised? No Qualifiers: CKD,CHF  Unable to reach pt - Reached pt. Pt. Is out of 7 day window. Frances Kent

## 2020-03-09 ENCOUNTER — Other Ambulatory Visit: Payer: Self-pay | Admitting: Internal Medicine

## 2020-03-09 ENCOUNTER — Telehealth: Payer: Self-pay

## 2020-03-09 ENCOUNTER — Emergency Department
Admission: EM | Admit: 2020-03-09 | Discharge: 2020-03-09 | Disposition: A | Discharge status: Home / Self Care | Payer: Medicare HMO | Attending: Emergency Medicine | Admitting: Emergency Medicine

## 2020-03-09 ENCOUNTER — Emergency Department: Payer: Medicare HMO

## 2020-03-09 ENCOUNTER — Other Ambulatory Visit: Payer: Self-pay

## 2020-03-09 DIAGNOSIS — E039 Hypothyroidism, unspecified: Secondary | ICD-10-CM | POA: Insufficient documentation

## 2020-03-09 DIAGNOSIS — N1831 Chronic kidney disease, stage 3a: Secondary | ICD-10-CM | POA: Insufficient documentation

## 2020-03-09 DIAGNOSIS — E860 Dehydration: Secondary | ICD-10-CM | POA: Insufficient documentation

## 2020-03-09 DIAGNOSIS — R0602 Shortness of breath: Secondary | ICD-10-CM | POA: Insufficient documentation

## 2020-03-09 DIAGNOSIS — R42 Dizziness and giddiness: Secondary | ICD-10-CM | POA: Diagnosis not present

## 2020-03-09 DIAGNOSIS — U099 Post covid-19 condition, unspecified: Secondary | ICD-10-CM | POA: Diagnosis not present

## 2020-03-09 DIAGNOSIS — R059 Cough, unspecified: Secondary | ICD-10-CM | POA: Insufficient documentation

## 2020-03-09 DIAGNOSIS — I130 Hypertensive heart and chronic kidney disease with heart failure and stage 1 through stage 4 chronic kidney disease, or unspecified chronic kidney disease: Secondary | ICD-10-CM | POA: Insufficient documentation

## 2020-03-09 DIAGNOSIS — Z7901 Long term (current) use of anticoagulants: Secondary | ICD-10-CM | POA: Insufficient documentation

## 2020-03-09 DIAGNOSIS — I509 Heart failure, unspecified: Secondary | ICD-10-CM | POA: Insufficient documentation

## 2020-03-09 DIAGNOSIS — Z79899 Other long term (current) drug therapy: Secondary | ICD-10-CM | POA: Insufficient documentation

## 2020-03-09 DIAGNOSIS — R5383 Other fatigue: Secondary | ICD-10-CM | POA: Diagnosis present

## 2020-03-09 DIAGNOSIS — R55 Syncope and collapse: Secondary | ICD-10-CM | POA: Diagnosis not present

## 2020-03-09 LAB — CBC
HCT: 44.9 % (ref 36.0–46.0)
Hemoglobin: 14.3 g/dL (ref 12.0–15.0)
MCH: 28.6 pg (ref 26.0–34.0)
MCHC: 31.8 g/dL (ref 30.0–36.0)
MCV: 89.8 fL (ref 80.0–100.0)
Platelets: 420 10*3/uL — ABNORMAL HIGH (ref 150–400)
RBC: 5 MIL/uL (ref 3.87–5.11)
RDW: 14.9 % (ref 11.5–15.5)
WBC: 9.3 10*3/uL (ref 4.0–10.5)
nRBC: 0 % (ref 0.0–0.2)

## 2020-03-09 LAB — BASIC METABOLIC PANEL
Anion gap: 14 (ref 5–15)
BUN: 34 mg/dL — ABNORMAL HIGH (ref 8–23)
CO2: 26 mmol/L (ref 22–32)
Calcium: 9.2 mg/dL (ref 8.9–10.3)
Chloride: 102 mmol/L (ref 98–111)
Creatinine, Ser: 1.59 mg/dL — ABNORMAL HIGH (ref 0.44–1.00)
GFR, Estimated: 31 mL/min — ABNORMAL LOW (ref >60)
Glucose, Bld: 113 mg/dL — ABNORMAL HIGH (ref 70–99)
Potassium: 4.1 mmol/L (ref 3.5–5.1)
Sodium: 142 mmol/L (ref 135–145)

## 2020-03-09 LAB — TROPONIN I (HIGH SENSITIVITY): Troponin I (High Sensitivity): 22 ng/L — ABNORMAL HIGH (ref <18)

## 2020-03-09 MED ORDER — SODIUM CHLORIDE 0.9 % IV BOLUS
1000.0000 mL | Freq: Once | INTRAVENOUS | Status: AC
Start: 2020-03-09 — End: 2020-03-09
  Administered 2020-03-09: 1000 mL via INTRAVENOUS

## 2020-03-09 MED ORDER — PREDNISONE 5 MG PO TABS: ORAL_TABLET | ORAL | 0 refills | Status: DC

## 2020-03-09 NOTE — Telephone Encounter (Signed)
Spoken to patient she stated she has had lightheadedness yesterday after taking her last prednisone pill. She felt fine otherwise. She is light headedness when she stands up. Lightheadedness goes away after she sits down. No other sx, no SOB, Chest px, Arm px, jaw pain, heart palpitation, leg swelling, fallen, and fainting. Current BP is 85/56 and BPM is 99. Instructed patient to go to UC/ED. Patient stated she will go to [facility].

## 2020-03-09 NOTE — ED Triage Notes (Signed)
Pt comes with c/o hypotension and dizziness. Pt states last few days she has been feeling bad and has felt dizzy.  Pt denies any CP or SOb. Pt states some lightheadedness.

## 2020-03-09 NOTE — Telephone Encounter (Signed)
Patient stated her last does of prednisone is today. She has not taken it. Also patient does not believe she will make it in time to [facility] uc. She stated she will go to [facility].

## 2020-03-09 NOTE — ED Notes (Signed)
Discharge signature on paper copy.

## 2020-03-09 NOTE — Telephone Encounter (Signed)
Pt's daughter Frances Kent said pt checked her blood pressure at 3pm today and it was showing 70/55 and her oxygen was 95 which was good. Pt is concerned about low blood pressure and don't want to end up in the ER. She is wondering if there is anything Dr. Derrel Nip could give her so she don't end up at hospital? Pt's daughter would like a call back today. Would pt need to be triaged?

## 2020-03-09 NOTE — ED Provider Notes (Signed)
[facility] Emergency Department Provider Note  ____________________________________________  Time seen: Approximately 10:51 PM  I have reviewed the triage vital signs and the nursing notes.   HISTORY  Chief Complaint Dizziness    HPI Frances Kent is a 87 y.o. female with a history of atrial fibrillation, GERD, hypertension, SVT who reports having Covid about 2 weeks ago.  Her shortness of breath and cough have all improved, and she is on a steroid course over the last several days.  However, she is not been taking much fluids and has become increasingly fatigued, getting dizzy with standing.  No syncope, no falls or head trauma.  Symptoms are intermittent, better with sitting and resting.  Denies pain.      Past Medical History:  Diagnosis Date  . Atrial fibrillation ([place])   . Colon adenomas   . GERD (gastroesophageal reflux disease)   . Hypertension   . Hypothyroidism   . Macular degeneration   . Mitral regurgitation   . Pulmonary embolism ([place]) 02/2016  . SVT (supraventricular tachycardia) [facility])      Patient Active Problem List   Diagnosis Date Noted  . Suspected COVID-19 virus infection 03/03/2020  . Schatzki's ring of distal esophagus 12/11/2019  . Erosive gastritis 11/30/2019  . Dysphagia 11/27/2019  . Painful swallowing 11/27/2019  . Gastroesophageal reflux disease 11/27/2019  . Stage 3a chronic kidney disease ([place]) 04/11/2019  . Coagulopathy ([place]) 09/12/2018  . Essential hypertension 09/12/2018  . CHF (congestive heart failure) ([place]) 08/15/2018  . Hypothyroidism due to acquired atrophy of thyroid 05/08/2018  . Unintentional weight loss 05/08/2018  . Myalgia due to statin 05/08/2018  . Abdominal aortic atherosclerosis ([place]) 05/07/2018  . Bradycardia 04/28/2018  . Pleural effusion on left 02/27/2018  . Moderate mitral stenosis 01/10/2018  . Lumbar radiculitis 01/07/2018  . Low back pain 09/02/2017  . B12 deficiency 07/26/2017   . Basophilia 07/26/2017  . Benign breast cyst in female, left 10/07/2016  . Atrial fibrillation status post cardioversion ([place]) 04/06/2016  . Hospital discharge follow-up 03/10/2016  . Right ankle swelling 12/21/2015  . Vitamin D deficiency 04/08/2015  . Cervical spine degeneration 12/13/2014  . History of pulmonary embolism 12/02/2014  . Insomnia 10/04/2014  . Moderate tricuspid insufficiency 09/17/2014  . Generalized anxiety disorder 03/28/2013  . Mitral valve prolapse 03/28/2013  . Routine adult health maintenance 03/23/2012  . Fatigue 03/23/2012  . Screening for breast cancer 11/29/2010  . Macular degeneration, left eye 11/29/2010  . Screening for colon cancer 11/29/2010  . Hyperlipidemia LDL goal <160 11/29/2010  . GERD (gastroesophageal reflux disease)      Past Surgical History:  Procedure Laterality Date  . APPENDECTOMY  1960  . BREAST CYST ASPIRATION Left 2017  . CARDIOVERSION N/A 04/05/2016   Procedure: Cardioversion;  Surgeon: [name], MD;  Location: [facility] ORS;  Service: Cardiovascular;  Laterality: N/A;  . CHOLECYSTECTOMY  1985  . ESOPHAGOGASTRODUODENOSCOPY (EGD) WITH PROPOFOL N/A 11/27/2019   Procedure: ESOPHAGOGASTRODUODENOSCOPY (EGD) WITH PROPOFOL;  Surgeon: Lesly Rubenstein, MD;  Location: [facility] ENDOSCOPY;  Service: Endoscopy;  Laterality: N/A;  . ESOPHAGOGASTRODUODENOSCOPY (EGD) WITH PROPOFOL N/A 12/24/2019   Procedure: ESOPHAGOGASTRODUODENOSCOPY (EGD) WITH PROPOFOL;  Surgeon: Lesly Rubenstein, MD;  Location: [facility] ENDOSCOPY;  Service: Endoscopy;  Laterality: N/A;  . OVARIAN CYST REMOVAL    . TEE WITHOUT CARDIOVERSION N/A 02/01/2018   Procedure: TRANSESOPHAGEAL ECHOCARDIOGRAM (TEE);  Surgeon: [name], MD;  Location: [facility] ORS;  Service: Cardiovascular;  Laterality: N/A;     Prior  to Admission medications   Medication Sig Start Date End Date Taking? Authorizing Provider  amiodarone (PACERONE) 200 MG tablet Take 200 mg by mouth daily.     [provider]  amoxicillin (AMOXIL) 500 MG tablet Take 2,000 mg by mouth once. Prior to dentist appt Patient not taking: Reported on 03/03/2020    [provider]  azithromycin (ZITHROMAX) 500 MG tablet Take 1 tablet (500 mg total) by mouth daily. 03/03/20   [name], MD  benzonatate (TESSALON) 200 MG capsule Take 1 capsule (200 mg total) by mouth 3 (three) times daily as needed for cough. 03/03/20   [name], MD  Bevacizumab (AVASTIN IV) Inject 25 mg/mL into the vein. Every nine weeks    [provider]  furosemide (LASIX) 20 MG tablet Take 1 tablet (20 mg total) by mouth daily. As needed for overnight weight gain Patient not taking: Reported on 03/03/2020 04/24/19   [name], MD  levothyroxine (EUTHYROX) 50 MCG tablet TAKE 1 TABLET BY MOUTH ONCE DAILY TAKE  30  MINUTES  PRIOR  TO  EATING 09/12/19   [name], MD  mirtazapine (REMERON) 15 MG tablet 1/2 to 1 tablet 30 minutes before bedtime daily 11/22/19   [name], MD  pantoprazole (PROTONIX) 20 MG tablet Take 20 mg by mouth daily. 11/27/19   [provider]  potassium chloride (KLOR-CON) 10 MEQ tablet Take 10 mEq by mouth 2 (two) times daily. Patient not taking: No sig reported    [provider]  predniSONE (DELTASONE) 10 MG tablet 6 tablets on Day 1 , then reduce by 1 tablet daily until gone 03/03/20   [name], MD  XARELTO 15 MG TABS tablet Take 1 tablet by mouth once daily 10/24/19   [name], MD     Allergies Statins   Family History  Problem Relation Age of Onset  . Cancer Mother 57       breast cancer, lived to 46,   . Breast cancer Mother 50  . Cancer Son 64       pancreatic cancer  . Heart disease Son        CAD, Tobacco Abuse   . Heart disease Maternal Grandmother 88       died of massive MI  . Stroke Sister     Social History Social History   Tobacco Use  . Smoking status: Never Smoker  . Smokeless tobacco: Never Used  .  Tobacco comment: 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 , worked at [employer], Health visitor  . Vaping Use: Never used  Substance Use Topics  . Alcohol use: No  . Drug use: No    Review of Systems  Constitutional:   No fever or chills.  ENT:   No sore throat. No rhinorrhea. Cardiovascular:   No chest pain or syncope. Respiratory:   No dyspnea or cough. Gastrointestinal:   Negative for abdominal pain, vomiting and diarrhea.  Musculoskeletal:   Negative for focal pain or swelling All other systems reviewed and are negative except as documented above in ROS and HPI.  ____________________________________________   PHYSICAL EXAM:  VITAL SIGNS: ED Triage Vitals [03/09/20 1817]  Enc Vitals Group     BP 101/71     Pulse Rate (!) 50     Resp 18     Temp 98.4 F (36.9 C)     Temp src      SpO2 100 %     Weight 103  lb (46.7 kg)     Height 5\' 3"  (1.6 m)     Head Circumference      Peak Flow      Pain Score 2     Pain Loc      Pain Edu?      Excl. in [place]?     Vital signs reviewed, nursing assessments reviewed.   Constitutional:   Alert and oriented. Non-toxic appearance. Eyes:   Conjunctivae are normal. EOMI. PERRL. ENT      Head:   Normocephalic and atraumatic.      Nose:   Wearing a mask.      Mouth/Throat:   Wearing a mask.      Neck:   No meningismus. Full ROM. Hematological/Lymphatic/Immunilogical:   No cervical lymphadenopathy. Cardiovascular:   RRR. Symmetric bilateral radial and DP pulses.  No murmurs. Cap refill less than 2 seconds. Respiratory:   Normal respiratory effort without tachypnea/retractions.  Mild basilar crackles. Gastrointestinal:   Soft and nontender. Non distended. There is no CVA tenderness.  No rebound, rigidity, or guarding. Musculoskeletal:   Normal range of motion in all extremities. No joint effusions.  No lower extremity tenderness.  No edema. Neurologic:   Normal speech and language.  Motor grossly intact. No acute focal neurologic deficits are  appreciated.  Skin:    Skin is warm, dry and intact. No rash noted.  No petechiae, purpura, or bullae.  ____________________________________________    LABS (pertinent positives/negatives) (all labs ordered are listed, but only abnormal results are displayed) Labs Reviewed  BASIC METABOLIC PANEL - Abnormal; Notable for the following components:      Result Value   Glucose, Bld 113 (*)    BUN 34 (*)    Creatinine, Ser 1.59 (*)    GFR, Estimated 31 (*)    All other components within normal limits  CBC - Abnormal; Notable for the following components:   Platelets 420 (*)    All other components within normal limits  TROPONIN I (HIGH SENSITIVITY) - Abnormal; Notable for the following components:   Troponin I (High Sensitivity) 22 (*)    All other components within normal limits  URINALYSIS, COMPLETE (UACMP) WITH MICROSCOPIC  CBG MONITORING, ED   ____________________________________________   EKG  Interpreted by me Atrial fibrillation, rate of 116.  Left axis, normal intervals.  Right bundle branch block.  No acute ischemic changes.  ____________________________________________    RADIOLOGY  CT Head Wo Contrast  Result Date: 03/09/2020 CLINICAL DATA:  Hypotension, dizziness, COVID-19 positive 03/03/2020 EXAM: CT HEAD WITHOUT CONTRAST TECHNIQUE: Contiguous axial images were obtained from the base of the skull through the vertex without intravenous contrast. COMPARISON:  02/05/2013 FINDINGS: Brain: No acute infarct or hemorrhage. Lateral ventricles and midline structures are unremarkable. No acute extra-axial fluid collections. No mass effect. Vascular: No hyperdense vessel or unexpected calcification. Skull: Normal. Negative for fracture or focal lesion. Sinuses/Orbits: No acute finding. Other: None. IMPRESSION: 1. No acute intracranial process. Electronically Signed   By: Randa Ngo M.D.   On: 03/09/2020 21:07     ____________________________________________   PROCEDURES Procedures  ____________________________________________  DIFFERENTIAL DIAGNOSIS   Dehydration, electrolyte abnormality, post Covid syndrome, steroid-induced gastritis  CLINICAL IMPRESSION / ASSESSMENT AND PLAN / ED COURSE  Medications ordered in the ED: Medications  sodium chloride 0.9 % bolus 1,000 mL (0 mLs Intravenous Stopped 03/09/20 2132)    Pertinent labs & imaging results that were available during my care of the patient were reviewed by me and considered  in my medical decision making (see chart for details).  Frances Kent was evaluated in Emergency Department on 03/09/2020 for the symptoms described in the history of present illness. She was evaluated in the context of the global COVID-19 pandemic, which necessitated consideration that the patient might be at risk for infection with the SARS-CoV-2 virus that causes COVID-19. Institutional protocols and algorithms that pertain to the evaluation of patients at risk for COVID-19 are in a state of rapid change based on information released by regulatory bodies including the CDC and federal and state organizations. These policies and algorithms were followed during the patient's care in the ED.   Patient presents with lightheadedness with standing, generalized weakness in the setting of recent Covid infection and decreased oral intake.  She does report that prednisone has improved her appetite but still not drinking a lot of fluids  Symptoms overall very vague, constitutional, nonacute.  Doubt ACS PE dissection bacterial pneumonia pleural effusion pulmonary edema or pericarditis.  Doubt intracranial hemorrhage or stroke.  Labs show evidence of dehydration with a mild rising BUN and creatinine.  Patient was given a liter of IV fluid bolus and feels much better.  Orthostatic vital signs normalized.  Stable for discharge home, encouraged her to continue focusing on oral  hydration.  She is finishing her prednisone course today, and will continue her other medications.  She still has some persistent lung crackles which I think are from healing Covid pneumonitis.  She is afebrile, breathing comfortably with normal oxygen saturation, reports improving symptoms, no leukocytosis, doubt acute infection.      ____________________________________________   FINAL CLINICAL IMPRESSION(S) / ED DIAGNOSES    Final diagnoses:  Post-COVID syndrome  Dehydration     ED Discharge Orders    None      Portions of this note were generated with dragon dictation software. Dictation errors may occur despite best attempts at proofreading.   Carrie Mew, MD 03/09/20 2259

## 2020-03-09 NOTE — ED Notes (Signed)
Patient to CT at this time

## 2020-03-09 NOTE — Telephone Encounter (Signed)
Her low blood pressure may be due to stopping the prednisone too soon.  If she went to urgent care last night and they did not given her a lower dose of prednisone to take and wean  Over the next few days, she should fill the one that I sent last night:  5 mg daily for 3 days, then every other day for 3 days

## 2020-03-10 NOTE — Telephone Encounter (Signed)
Spoke with pt to let her know that she needs to take the prednisone taper that was sent in last night. Pt wanted to know if it would lower her bp and I explained to her that prednisone would not lower her bp but that you thought her bp got low because she stopped the prednisone to soon. Pt stated that she did not stop the prednisone she stated she finished the whole taper. She stated that she did not finish the cough medicine because she did not need it.

## 2020-03-10 NOTE — Telephone Encounter (Signed)
Spoke with Frances Kent to let her know that Dr. Derrel Nip does still want her to the taper because it will help to keep her bp from dropping to low. Frances Kent gave a verbal understanding.

## 2020-03-10 NOTE — Telephone Encounter (Signed)
The messages I received suggest otherwise.  I would still like  her to take it anyway,  It is not enough to raise bp high and will keep her from dropping too low

## 2020-03-10 NOTE — Telephone Encounter (Signed)
Spoke with pt and she stated that she went to the ER yesterday. She stated that they gave a bag of fluids. Pt checked her bp this morning and it was 132/96 pulse 49 and O2 97%. Pt stated that she has been drinking more fluids today but still doesn't feel "quite up to par". Does pt still need to take the prednisone that was sent in?

## 2020-03-10 NOTE — Telephone Encounter (Signed)
Yes,  I advise her to continue the prednisone taper using the 5 mg tablets that I sent in last night

## 2020-03-17 DIAGNOSIS — H353212 Exudative age-related macular degeneration, right eye, with inactive choroidal neovascularization: Secondary | ICD-10-CM | POA: Diagnosis not present

## 2020-03-23 ENCOUNTER — Telehealth: Payer: Self-pay | Admitting: Internal Medicine

## 2020-03-23 NOTE — Telephone Encounter (Signed)
Pt  tested positive for covid on 1/20 she is still very fatigue and is having a dry cough  Pt stated that [facility] would [facility] to [facility] because she wanted to be seen in person

## 2020-03-24 DIAGNOSIS — I700 Atherosclerosis of aorta: Secondary | ICD-10-CM | POA: Diagnosis not present

## 2020-03-24 DIAGNOSIS — R0602 Shortness of breath: Secondary | ICD-10-CM | POA: Diagnosis not present

## 2020-03-24 DIAGNOSIS — E782 Mixed hyperlipidemia: Secondary | ICD-10-CM | POA: Diagnosis not present

## 2020-03-24 DIAGNOSIS — I050 Rheumatic mitral stenosis: Secondary | ICD-10-CM | POA: Diagnosis not present

## 2020-03-24 DIAGNOSIS — I071 Rheumatic tricuspid insufficiency: Secondary | ICD-10-CM | POA: Diagnosis not present

## 2020-03-24 DIAGNOSIS — I639 Cerebral infarction, unspecified: Secondary | ICD-10-CM | POA: Diagnosis present

## 2020-03-24 DIAGNOSIS — I480 Paroxysmal atrial fibrillation: Secondary | ICD-10-CM | POA: Diagnosis not present

## 2020-03-24 DIAGNOSIS — I341 Nonrheumatic mitral (valve) prolapse: Secondary | ICD-10-CM | POA: Diagnosis not present

## 2020-03-24 DIAGNOSIS — I5020 Unspecified systolic (congestive) heart failure: Secondary | ICD-10-CM | POA: Diagnosis not present

## 2020-03-24 DIAGNOSIS — I10 Essential (primary) hypertension: Secondary | ICD-10-CM | POA: Diagnosis not present

## 2020-03-27 ENCOUNTER — Other Ambulatory Visit
Admission: RE | Admit: 2020-03-27 | Discharge: 2020-03-27 | Disposition: A | Discharge status: Home / Self Care | Payer: Medicare HMO | Source: Ambulatory Visit | Attending: Cardiology | Admitting: Cardiology

## 2020-03-27 DIAGNOSIS — I071 Rheumatic tricuspid insufficiency: Secondary | ICD-10-CM | POA: Diagnosis not present

## 2020-03-27 DIAGNOSIS — I050 Rheumatic mitral stenosis: Secondary | ICD-10-CM | POA: Diagnosis not present

## 2020-03-27 DIAGNOSIS — J9811 Atelectasis: Secondary | ICD-10-CM | POA: Diagnosis not present

## 2020-03-27 DIAGNOSIS — I4891 Unspecified atrial fibrillation: Secondary | ICD-10-CM | POA: Insufficient documentation

## 2020-03-27 DIAGNOSIS — R0602 Shortness of breath: Secondary | ICD-10-CM | POA: Insufficient documentation

## 2020-03-27 DIAGNOSIS — I5020 Unspecified systolic (congestive) heart failure: Secondary | ICD-10-CM | POA: Diagnosis not present

## 2020-03-27 DIAGNOSIS — R053 Chronic cough: Secondary | ICD-10-CM | POA: Diagnosis not present

## 2020-03-27 DIAGNOSIS — I517 Cardiomegaly: Secondary | ICD-10-CM | POA: Diagnosis not present

## 2020-03-27 DIAGNOSIS — R059 Cough, unspecified: Secondary | ICD-10-CM | POA: Diagnosis not present

## 2020-03-27 DIAGNOSIS — E782 Mixed hyperlipidemia: Secondary | ICD-10-CM | POA: Diagnosis not present

## 2020-03-27 DIAGNOSIS — I10 Essential (primary) hypertension: Secondary | ICD-10-CM | POA: Diagnosis not present

## 2020-03-27 DIAGNOSIS — I341 Nonrheumatic mitral (valve) prolapse: Secondary | ICD-10-CM | POA: Diagnosis not present

## 2020-03-27 DIAGNOSIS — I700 Atherosclerosis of aorta: Secondary | ICD-10-CM | POA: Diagnosis not present

## 2020-03-27 DIAGNOSIS — I480 Paroxysmal atrial fibrillation: Secondary | ICD-10-CM | POA: Insufficient documentation

## 2020-03-27 LAB — BRAIN NATRIURETIC PEPTIDE: B Natriuretic Peptide: 901.4 pg/mL — ABNORMAL HIGH (ref 0.0–100.0)

## 2020-03-28 ENCOUNTER — Encounter: Payer: Self-pay | Admitting: Emergency Medicine

## 2020-03-28 ENCOUNTER — Emergency Department
Admission: EM | Admit: 2020-03-28 | Discharge: 2020-03-28 | Disposition: A | Discharge status: Home / Self Care | Payer: Medicare HMO | Attending: Emergency Medicine | Admitting: Emergency Medicine

## 2020-03-28 ENCOUNTER — Other Ambulatory Visit: Payer: Self-pay | Admitting: Internal Medicine

## 2020-03-28 ENCOUNTER — Other Ambulatory Visit: Payer: Self-pay

## 2020-03-28 ENCOUNTER — Emergency Department: Payer: Medicare HMO

## 2020-03-28 DIAGNOSIS — Z79899 Other long term (current) drug therapy: Secondary | ICD-10-CM | POA: Insufficient documentation

## 2020-03-28 DIAGNOSIS — I509 Heart failure, unspecified: Secondary | ICD-10-CM | POA: Diagnosis not present

## 2020-03-28 DIAGNOSIS — E039 Hypothyroidism, unspecified: Secondary | ICD-10-CM | POA: Insufficient documentation

## 2020-03-28 DIAGNOSIS — Z8616 Personal history of COVID-19: Secondary | ICD-10-CM | POA: Insufficient documentation

## 2020-03-28 DIAGNOSIS — I517 Cardiomegaly: Secondary | ICD-10-CM | POA: Diagnosis not present

## 2020-03-28 DIAGNOSIS — R531 Weakness: Secondary | ICD-10-CM

## 2020-03-28 DIAGNOSIS — Z7901 Long term (current) use of anticoagulants: Secondary | ICD-10-CM | POA: Insufficient documentation

## 2020-03-28 DIAGNOSIS — I130 Hypertensive heart and chronic kidney disease with heart failure and stage 1 through stage 4 chronic kidney disease, or unspecified chronic kidney disease: Secondary | ICD-10-CM | POA: Diagnosis not present

## 2020-03-28 DIAGNOSIS — I4891 Unspecified atrial fibrillation: Secondary | ICD-10-CM | POA: Insufficient documentation

## 2020-03-28 DIAGNOSIS — R059 Cough, unspecified: Secondary | ICD-10-CM | POA: Diagnosis not present

## 2020-03-28 DIAGNOSIS — R110 Nausea: Secondary | ICD-10-CM | POA: Diagnosis not present

## 2020-03-28 DIAGNOSIS — N1831 Chronic kidney disease, stage 3a: Secondary | ICD-10-CM | POA: Insufficient documentation

## 2020-03-28 LAB — URINALYSIS, COMPLETE (UACMP) WITH MICROSCOPIC
Bacteria, UA: NONE SEEN
Bilirubin Urine: NEGATIVE
Glucose, UA: NEGATIVE mg/dL
Hgb urine dipstick: NEGATIVE
Ketones, ur: 5 mg/dL — AB
Leukocytes,Ua: NEGATIVE
Nitrite: NEGATIVE
Protein, ur: 100 mg/dL — AB
Specific Gravity, Urine: 1.028 (ref 1.005–1.030)
pH: 5 (ref 5.0–8.0)

## 2020-03-28 LAB — CBC
HCT: 34.5 % — ABNORMAL LOW (ref 36.0–46.0)
Hemoglobin: 11.1 g/dL — ABNORMAL LOW (ref 12.0–15.0)
MCH: 29.9 pg (ref 26.0–34.0)
MCHC: 32.2 g/dL (ref 30.0–36.0)
MCV: 93 fL (ref 80.0–100.0)
Platelets: 214 10*3/uL (ref 150–400)
RBC: 3.71 MIL/uL — ABNORMAL LOW (ref 3.87–5.11)
RDW: 17.4 % — ABNORMAL HIGH (ref 11.5–15.5)
WBC: 6.9 10*3/uL (ref 4.0–10.5)
nRBC: 0 % (ref 0.0–0.2)

## 2020-03-28 LAB — BASIC METABOLIC PANEL
Anion gap: 13 (ref 5–15)
BUN: 21 mg/dL (ref 8–23)
CO2: 21 mmol/L — ABNORMAL LOW (ref 22–32)
Calcium: 8.8 mg/dL — ABNORMAL LOW (ref 8.9–10.3)
Chloride: 107 mmol/L (ref 98–111)
Creatinine, Ser: 1.27 mg/dL — ABNORMAL HIGH (ref 0.44–1.00)
GFR, Estimated: 41 mL/min — ABNORMAL LOW (ref >60)
Glucose, Bld: 132 mg/dL — ABNORMAL HIGH (ref 70–99)
Potassium: 4.7 mmol/L (ref 3.5–5.1)
Sodium: 141 mmol/L (ref 135–145)

## 2020-03-28 LAB — TROPONIN I (HIGH SENSITIVITY): Troponin I (High Sensitivity): 16 ng/L (ref <18)

## 2020-03-28 MED ORDER — METOPROLOL TARTRATE 5 MG/5ML IV SOLN
5.0000 mg | INTRAVENOUS | Status: DC | PRN
  Administered 2020-03-28: 5 mg via INTRAVENOUS
  Filled 2020-03-28: qty 5

## 2020-03-28 NOTE — ED Provider Notes (Signed)
[facility] Emergency Department Provider Note   ____________________________________________   Event Date/Time   First MD Initiated Contact with Patient 03/28/20 1353     (approximate)  I have reviewed the triage vital signs and the nursing notes.   HISTORY  Chief Complaint Atrial Fibrillation, Nausea, and Shortness of Breath    HPI Frances Kent is a 87 y.o. female with past medical history of hypertension, mitral regurgitation, CHF, paroxysmal atrial fibrillation on Xarelto, and CKD who presents to the ED for generalized weakness.  Patient reports that she was initially diagnosed with COVID-19 back in January, had completed 2 courses of steroids with some improvement in her symptoms.  She then started feeling much weaker earlier this month with recurrent cough and occasional difficulty breathing.  She denies any fevers, chest pain, abdominal pain, vomiting, diarrhea, or dysuria.  She went to see her cardiologist 4 days ago and was found to be in atrial fibrillation with RVR.  Her amiodarone dose was first increased and then she was started on metoprolol for additional rate control.  She continues to feel generally weak with occasional difficulty breathing, but denies any difficulty breathing at rest.  She has taken 2 doses of the metoprolol thus far, once yesterday evening and once earlier today.        Past Medical History:  Diagnosis Date  . Atrial fibrillation ([place])   . Colon adenomas   . GERD (gastroesophageal reflux disease)   . Hypertension   . Hypothyroidism   . Macular degeneration   . Mitral regurgitation   . Pulmonary embolism ([place]) 02/2016  . SVT (supraventricular tachycardia) [facility])     Patient Active Problem List   Diagnosis Date Noted  . Suspected COVID-19 virus infection 03/03/2020  . Schatzki's ring of distal esophagus 12/11/2019  . Erosive gastritis 11/30/2019  . Dysphagia 11/27/2019  . Painful swallowing 11/27/2019  .  Gastroesophageal reflux disease 11/27/2019  . Stage 3a chronic kidney disease ([place]) 04/11/2019  . Coagulopathy ([place]) 09/12/2018  . Essential hypertension 09/12/2018  . CHF (congestive heart failure) ([place]) 08/15/2018  . Hypothyroidism due to acquired atrophy of thyroid 05/08/2018  . Unintentional weight loss 05/08/2018  . Myalgia due to statin 05/08/2018  . Abdominal aortic atherosclerosis ([place]) 05/07/2018  . Bradycardia 04/28/2018  . Pleural effusion on left 02/27/2018  . Moderate mitral stenosis 01/10/2018  . Lumbar radiculitis 01/07/2018  . Low back pain 09/02/2017  . B12 deficiency 07/26/2017  . Basophilia 07/26/2017  . Benign breast cyst in female, left 10/07/2016  . Atrial fibrillation status post cardioversion ([place]) 04/06/2016  . Hospital discharge follow-up 03/10/2016  . Right ankle swelling 12/21/2015  . Vitamin D deficiency 04/08/2015  . Cervical spine degeneration 12/13/2014  . History of pulmonary embolism 12/02/2014  . Insomnia 10/04/2014  . Moderate tricuspid insufficiency 09/17/2014  . Generalized anxiety disorder 03/28/2013  . Mitral valve prolapse 03/28/2013  . Routine adult health maintenance 03/23/2012  . Fatigue 03/23/2012  . Screening for breast cancer 11/29/2010  . Macular degeneration, left eye 11/29/2010  . Screening for colon cancer 11/29/2010  . Hyperlipidemia LDL goal <160 11/29/2010  . GERD (gastroesophageal reflux disease)     Past Surgical History:  Procedure Laterality Date  . APPENDECTOMY  1960  . BREAST CYST ASPIRATION Left 2017  . CARDIOVERSION N/A 04/05/2016   Procedure: Cardioversion;  Surgeon: [name], MD;  Location: [facility] ORS;  Service: Cardiovascular;  Laterality: N/A;  . CHOLECYSTECTOMY  1985  . ESOPHAGOGASTRODUODENOSCOPY (EGD) WITH PROPOFOL  N/A 11/27/2019   Procedure: ESOPHAGOGASTRODUODENOSCOPY (EGD) WITH PROPOFOL;  Surgeon: Lesly Rubenstein, MD;  Location: [facility] ENDOSCOPY;  Service: Endoscopy;  Laterality: N/A;  .  ESOPHAGOGASTRODUODENOSCOPY (EGD) WITH PROPOFOL N/A 12/24/2019   Procedure: ESOPHAGOGASTRODUODENOSCOPY (EGD) WITH PROPOFOL;  Surgeon: Lesly Rubenstein, MD;  Location: [facility] ENDOSCOPY;  Service: Endoscopy;  Laterality: N/A;  . OVARIAN CYST REMOVAL    . TEE WITHOUT CARDIOVERSION N/A 02/01/2018   Procedure: TRANSESOPHAGEAL ECHOCARDIOGRAM (TEE);  Surgeon: [name], MD;  Location: [facility] ORS;  Service: Cardiovascular;  Laterality: N/A;    Prior to Admission medications   Medication Sig Start Date End Date Taking? Authorizing Provider  amiodarone (PACERONE) 200 MG tablet Take 200 mg by mouth daily.    [provider]  amoxicillin (AMOXIL) 500 MG tablet Take 2,000 mg by mouth once. Prior to dentist appt Patient not taking: Reported on 03/03/2020    [provider]  azithromycin (ZITHROMAX) 500 MG tablet Take 1 tablet (500 mg total) by mouth daily. 03/03/20   [name], MD  benzonatate (TESSALON) 200 MG capsule Take 1 capsule (200 mg total) by mouth 3 (three) times daily as needed for cough. 03/03/20   [name], MD  Bevacizumab (AVASTIN IV) Inject 25 mg/mL into the vein. Every nine weeks    [provider]  furosemide (LASIX) 20 MG tablet Take 1 tablet (20 mg total) by mouth daily. As needed for overnight weight gain Patient not taking: Reported on 03/03/2020 04/24/19   [name], MD  levothyroxine (EUTHYROX) 50 MCG tablet TAKE 1 TABLET BY MOUTH ONCE DAILY TAKE  30  MINUTES  PRIOR  TO  EATING 09/12/19   [name], MD  mirtazapine (REMERON) 15 MG tablet 1/2 to 1 tablet 30 minutes before bedtime daily 11/22/19   [name], MD  pantoprazole (PROTONIX) 20 MG tablet Take 20 mg by mouth daily. 11/27/19   [provider]  potassium chloride (KLOR-CON) 10 MEQ tablet Take 10 mEq by mouth 2 (two) times daily. Patient not taking: No sig reported    [provider]  predniSONE (DELTASONE) 10 MG tablet 6 tablets on Day 1 , then reduce by 1  tablet daily until gone 03/03/20   [name], MD  predniSONE (DELTASONE) 5 MG tablet 1 tablet daily for 3 days,  Then every other day for 3 days 03/09/20   [name], MD  XARELTO 15 MG TABS tablet Take 1 tablet by mouth once daily 10/24/19   [name], MD    Allergies Statins  Family History  Problem Relation Age of Onset  . Cancer Mother 76       breast cancer, lived to 54,   . Breast cancer Mother 31  . Cancer Son 84       pancreatic cancer  . Heart disease Son        CAD, Tobacco Abuse   . Heart disease Maternal Grandmother 65       died of massive MI  . Stroke Sister     Social History Social History   Tobacco Use  . Smoking status: Never Smoker  . Smokeless tobacco: Never Used  . Tobacco comment: 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 , worked at [employer], Health visitor  . Vaping Use: Never used  Substance Use Topics  . Alcohol use: No  . Drug use: No    Review of Systems  Constitutional: No fever/chills.  Positive for generalized weakness and fatigue. Eyes: No  visual changes. ENT: No sore throat. Cardiovascular: Denies chest pain. Respiratory: Positive for cough and shortness of breath. Gastrointestinal: No abdominal pain.  No nausea, no vomiting.  No diarrhea.  No constipation. Genitourinary: Negative for dysuria. Musculoskeletal: Negative for back pain. Skin: Negative for rash. Neurological: Negative for headaches, focal weakness or numbness.  ____________________________________________   PHYSICAL EXAM:  VITAL SIGNS: ED Triage Vitals [03/28/20 1049]  Enc Vitals Group     BP 111/76     Pulse Rate 89     Resp 18     Temp 97.9 F (36.6 C)     Temp Source Oral     SpO2 95 %     Weight 103 lb 2.8 oz (46.8 kg)     Height      Head Circumference      Peak Flow      Pain Score      Pain Loc      Pain Edu?      Excl. in [place]?     Constitutional: Alert and oriented. Eyes: Conjunctivae are normal. Head: Atraumatic. Nose: No  congestion/rhinnorhea. Mouth/Throat: Mucous membranes are moist. Neck: Normal ROM Cardiovascular: Tachycardic, irregularly irregular rhythm. Grossly normal heart sounds.  2+ radial pulses bilaterally. Respiratory: Normal respiratory effort.  No retractions. Lungs CTAB. Gastrointestinal: Soft and nontender. No distention. Genitourinary: deferred Musculoskeletal: No lower extremity tenderness nor edema. Neurologic:  Normal speech and language. No gross focal neurologic deficits are appreciated. Skin:  Skin is warm, dry and intact. No rash noted. Psychiatric: Mood and affect are normal. Speech and behavior are normal.  ____________________________________________   LABS (all labs ordered are listed, but only abnormal results are displayed)  Labs Reviewed  BASIC METABOLIC PANEL - Abnormal; Notable for the following components:      Result Value   CO2 21 (*)    Glucose, Bld 132 (*)    Creatinine, Ser 1.27 (*)    Calcium 8.8 (*)    GFR, Estimated 41 (*)    All other components within normal limits  CBC - Abnormal; Notable for the following components:   RBC 3.71 (*)    Hemoglobin 11.1 (*)    HCT 34.5 (*)    RDW 17.4 (*)    All other components within normal limits  URINALYSIS, COMPLETE (UACMP) WITH MICROSCOPIC - Abnormal; Notable for the following components:   Color, Urine YELLOW (*)    APPearance TURBID (*)    Ketones, ur 5 (*)    Protein, ur 100 (*)    All other components within normal limits  CBG MONITORING, ED  TROPONIN I (HIGH SENSITIVITY)   ____________________________________________  EKG  ED ECG REPORT I, Blake Divine, the attending physician, personally viewed and interpreted this ECG.   Date: 03/28/2020  EKG Time: 10:45  Rate: 103  Rhythm: Atrial fibrillation  Axis: LAD  Intervals:left anterior fascicular block  ST&T Change: Nonspecific ST/T changes   PROCEDURES  Procedure(s) performed (including Critical Care):  .1-3 Lead EKG  Interpretation Performed by: Blake Divine, MD Authorized by: Blake Divine, MD     Interpretation: abnormal     ECG rate:  90-110   ECG rate assessment: tachycardic     Rhythm: atrial fibrillation     Ectopy: none     Conduction: normal       ____________________________________________   INITIAL IMPRESSION / ASSESSMENT AND PLAN / ED COURSE       87 year old female with past medical history of hypertension, paroxysmal atrial fibrillation, CHF, mitral regurgitation,  and CKD who presents to the ED with increasing generalized weakness with intermittent shortness of breath over the past couple of weeks.  She recently had her amiodarone increased and was started on metoprolol due to issues with atrial fibrillation with RVR.  She is again in A. fib with RVR here in the ED, with heart rate consistently around 100.  This is likely a contributing factor to her generalized weakness as remainder of work-up is unremarkable.  Troponin is within normal limits and chest x-ray reviewed by me with no infiltrate, edema, or effusion.  UA shows no signs of infection and electrolytes are unremarkable.  Her heart rate improved with dose of IV metoprolol, is now consistently between 70 and 90.  She has follow-up scheduled with cardiology in 2 days and she is appropriate for discharge home at this time.  She was counseled to return to the ED for any chest pain, difficulty breathing, or any other worsening symptoms.  Patient and daughter agree with plan.      ____________________________________________   FINAL CLINICAL IMPRESSION(S) / ED DIAGNOSES  Final diagnoses:  Generalized weakness  Atrial fibrillation, unspecified type [facility])     ED Discharge Orders    None       Note:  This document was prepared using Dragon voice recognition software and may include unintentional dictation errors.   Blake Divine, MD 03/28/20 1531

## 2020-03-28 NOTE — ED Triage Notes (Signed)
Pt in w/weakness, nausea and more frequent cough. States she was recently dx w/afib this week, started Metoprolol yesterday. Pt did have Covid 1/7, has completed 2 rounds of Prednisone, but cough is now more frequent, and she feels weaker than her norm.

## 2020-03-30 DIAGNOSIS — I10 Essential (primary) hypertension: Secondary | ICD-10-CM | POA: Diagnosis not present

## 2020-03-30 DIAGNOSIS — I6523 Occlusion and stenosis of bilateral carotid arteries: Secondary | ICD-10-CM | POA: Diagnosis not present

## 2020-03-30 DIAGNOSIS — E782 Mixed hyperlipidemia: Secondary | ICD-10-CM | POA: Diagnosis not present

## 2020-03-30 DIAGNOSIS — I480 Paroxysmal atrial fibrillation: Secondary | ICD-10-CM | POA: Diagnosis not present

## 2020-03-30 DIAGNOSIS — I5020 Unspecified systolic (congestive) heart failure: Secondary | ICD-10-CM | POA: Diagnosis not present

## 2020-03-30 DIAGNOSIS — I700 Atherosclerosis of aorta: Secondary | ICD-10-CM | POA: Diagnosis not present

## 2020-03-30 DIAGNOSIS — I050 Rheumatic mitral stenosis: Secondary | ICD-10-CM | POA: Diagnosis not present

## 2020-03-30 DIAGNOSIS — I071 Rheumatic tricuspid insufficiency: Secondary | ICD-10-CM | POA: Diagnosis not present

## 2020-03-30 DIAGNOSIS — I341 Nonrheumatic mitral (valve) prolapse: Secondary | ICD-10-CM | POA: Diagnosis not present

## 2020-04-02 ENCOUNTER — Other Ambulatory Visit: Payer: Self-pay

## 2020-04-02 ENCOUNTER — Emergency Department: Payer: Medicare HMO

## 2020-04-02 ENCOUNTER — Emergency Department
Admission: EM | Admit: 2020-04-02 | Discharge: 2020-04-02 | Disposition: A | Discharge status: Home / Self Care | Payer: Medicare HMO | Attending: Student in an Organized Health Care Education/Training Program | Admitting: Student in an Organized Health Care Education/Training Program

## 2020-04-02 DIAGNOSIS — Z7722 Contact with and (suspected) exposure to environmental tobacco smoke (acute) (chronic): Secondary | ICD-10-CM | POA: Insufficient documentation

## 2020-04-02 DIAGNOSIS — R531 Weakness: Secondary | ICD-10-CM | POA: Diagnosis not present

## 2020-04-02 DIAGNOSIS — R059 Cough, unspecified: Secondary | ICD-10-CM | POA: Insufficient documentation

## 2020-04-02 DIAGNOSIS — I517 Cardiomegaly: Secondary | ICD-10-CM | POA: Diagnosis not present

## 2020-04-02 DIAGNOSIS — Z79899 Other long term (current) drug therapy: Secondary | ICD-10-CM | POA: Insufficient documentation

## 2020-04-02 DIAGNOSIS — I130 Hypertensive heart and chronic kidney disease with heart failure and stage 1 through stage 4 chronic kidney disease, or unspecified chronic kidney disease: Secondary | ICD-10-CM | POA: Diagnosis not present

## 2020-04-02 DIAGNOSIS — M79602 Pain in left arm: Secondary | ICD-10-CM | POA: Diagnosis not present

## 2020-04-02 DIAGNOSIS — R001 Bradycardia, unspecified: Secondary | ICD-10-CM | POA: Insufficient documentation

## 2020-04-02 DIAGNOSIS — N1831 Chronic kidney disease, stage 3a: Secondary | ICD-10-CM | POA: Insufficient documentation

## 2020-04-02 DIAGNOSIS — Z7901 Long term (current) use of anticoagulants: Secondary | ICD-10-CM | POA: Diagnosis not present

## 2020-04-02 DIAGNOSIS — R0602 Shortness of breath: Secondary | ICD-10-CM | POA: Diagnosis not present

## 2020-04-02 DIAGNOSIS — R5381 Other malaise: Secondary | ICD-10-CM | POA: Diagnosis present

## 2020-04-02 DIAGNOSIS — I509 Heart failure, unspecified: Secondary | ICD-10-CM | POA: Diagnosis not present

## 2020-04-02 DIAGNOSIS — J9811 Atelectasis: Secondary | ICD-10-CM | POA: Diagnosis not present

## 2020-04-02 DIAGNOSIS — J90 Pleural effusion, not elsewhere classified: Secondary | ICD-10-CM | POA: Diagnosis not present

## 2020-04-02 DIAGNOSIS — I313 Pericardial effusion (noninflammatory): Secondary | ICD-10-CM | POA: Diagnosis not present

## 2020-04-02 DIAGNOSIS — E039 Hypothyroidism, unspecified: Secondary | ICD-10-CM | POA: Insufficient documentation

## 2020-04-02 LAB — COMPREHENSIVE METABOLIC PANEL
ALT: 64 U/L — ABNORMAL HIGH (ref 0–44)
AST: 58 U/L — ABNORMAL HIGH (ref 15–41)
Albumin: 3.6 g/dL (ref 3.5–5.0)
Alkaline Phosphatase: 90 U/L (ref 38–126)
Anion gap: 9 (ref 5–15)
BUN: 19 mg/dL (ref 8–23)
CO2: 24 mmol/L (ref 22–32)
Calcium: 8.8 mg/dL — ABNORMAL LOW (ref 8.9–10.3)
Chloride: 107 mmol/L (ref 98–111)
Creatinine, Ser: 1.19 mg/dL — ABNORMAL HIGH (ref 0.44–1.00)
GFR, Estimated: 44 mL/min — ABNORMAL LOW (ref >60)
Glucose, Bld: 112 mg/dL — ABNORMAL HIGH (ref 70–99)
Potassium: 4.5 mmol/L (ref 3.5–5.1)
Sodium: 140 mmol/L (ref 135–145)
Total Bilirubin: 1 mg/dL (ref 0.3–1.2)
Total Protein: 6.8 g/dL (ref 6.5–8.1)

## 2020-04-02 LAB — CBC WITH DIFFERENTIAL/PLATELET
Abs Immature Granulocytes: 0.05 10*3/uL (ref 0.00–0.07)
Basophils Absolute: 0.1 10*3/uL (ref 0.0–0.1)
Basophils Relative: 2 %
Eosinophils Absolute: 0.1 10*3/uL (ref 0.0–0.5)
Eosinophils Relative: 1 %
HCT: 36 % (ref 36.0–46.0)
Hemoglobin: 11.3 g/dL — ABNORMAL LOW (ref 12.0–15.0)
Immature Granulocytes: 1 %
Lymphocytes Relative: 17 %
Lymphs Abs: 1 10*3/uL (ref 0.7–4.0)
MCH: 29.6 pg (ref 26.0–34.0)
MCHC: 31.4 g/dL (ref 30.0–36.0)
MCV: 94.2 fL (ref 80.0–100.0)
Monocytes Absolute: 0.6 10*3/uL (ref 0.1–1.0)
Monocytes Relative: 9 %
Neutro Abs: 4.2 10*3/uL (ref 1.7–7.7)
Neutrophils Relative %: 70 %
Platelets: 389 10*3/uL (ref 150–400)
RBC: 3.82 MIL/uL — ABNORMAL LOW (ref 3.87–5.11)
RDW: 18.2 % — ABNORMAL HIGH (ref 11.5–15.5)
WBC: 6 10*3/uL (ref 4.0–10.5)
nRBC: 0 % (ref 0.0–0.2)

## 2020-04-02 LAB — BRAIN NATRIURETIC PEPTIDE: B Natriuretic Peptide: 2094.8 pg/mL — ABNORMAL HIGH (ref 0.0–100.0)

## 2020-04-02 LAB — TROPONIN I (HIGH SENSITIVITY)
Troponin I (High Sensitivity): 22 ng/L — ABNORMAL HIGH (ref <18)
Troponin I (High Sensitivity): 22 ng/L — ABNORMAL HIGH (ref <18)

## 2020-04-02 MED ORDER — FUROSEMIDE 10 MG/ML IJ SOLN
40.0000 mg | Freq: Once | INTRAMUSCULAR | Status: AC
  Administered 2020-04-02: 40 mg via INTRAVENOUS
  Filled 2020-04-02: qty 4

## 2020-04-02 MED ORDER — IOHEXOL 350 MG/ML SOLN
60.0000 mL | Freq: Once | INTRAVENOUS | Status: AC | PRN
  Administered 2020-04-02: 60 mL via INTRAVENOUS

## 2020-04-02 NOTE — ED Notes (Signed)
Ambulated pt around room. SpO2 to 94%. Pt c/o "feeling like her legs are going to buckle." MD notified

## 2020-04-02 NOTE — ED Notes (Signed)
Took patient to room 3, placed on monitor, and reported to [employer].

## 2020-04-02 NOTE — Discharge Instructions (Addendum)
Decrease your amiodarone to 200mg  once daily.  Follow up with Dr. [name] in clinic.  Return if you develop any chest pain, shortness of breath, fatigue or for any additional questions or concerns.

## 2020-04-02 NOTE — ED Provider Notes (Signed)
[facility] Emergency Department Provider Note    Event Date/Time   First MD Initiated Contact with Patient 04/02/20 1003     (approximate)  I have reviewed the triage vital signs and the nursing notes.   HISTORY  Chief Complaint Atrial Fibrillation    HPI Frances Kent is a 87 y.o. female with the below listed past medical history on Xarelto and has been compliant with these medications presents to the ER for evaluation of generalized malaise cough and shortness of breath.  States she been having cough for several weeks.  Has had positive Covid test the end of January.  States that she does have a history of effusions and retaining fluid but does not feel like she is retaining fluid right now.  Denies any history of COPD or bronchitis.  Denies any pain right now but did have an episode of tightness in her left arm that occurred this morning    Past Medical History:  Diagnosis Date  . Atrial fibrillation ([place])   . Colon adenomas   . GERD (gastroesophageal reflux disease)   . Hypertension   . Hypothyroidism   . Macular degeneration   . Mitral regurgitation   . Pulmonary embolism ([place]) 02/2016  . SVT (supraventricular tachycardia) (HCC)    Family History  Problem Relation Age of Onset  . Cancer Mother 69       breast cancer, lived to 35,   . Breast cancer Mother 21  . Cancer Son 55       pancreatic cancer  . Heart disease Son        CAD, Tobacco Abuse   . Heart disease Maternal Grandmother 88       died of massive MI  . Stroke Sister    Past Surgical History:  Procedure Laterality Date  . APPENDECTOMY  1960  . BREAST CYST ASPIRATION Left 2017  . CARDIOVERSION N/A 04/05/2016   Procedure: Cardioversion;  Surgeon: [name], MD;  Location: [facility] ORS;  Service: Cardiovascular;  Laterality: N/A;  . CHOLECYSTECTOMY  1985  . ESOPHAGOGASTRODUODENOSCOPY (EGD) WITH PROPOFOL N/A 11/27/2019   Procedure: ESOPHAGOGASTRODUODENOSCOPY (EGD) WITH  PROPOFOL;  Surgeon: Lesly Rubenstein, MD;  Location: [facility] ENDOSCOPY;  Service: Endoscopy;  Laterality: N/A;  . ESOPHAGOGASTRODUODENOSCOPY (EGD) WITH PROPOFOL N/A 12/24/2019   Procedure: ESOPHAGOGASTRODUODENOSCOPY (EGD) WITH PROPOFOL;  Surgeon: Lesly Rubenstein, MD;  Location: [facility] ENDOSCOPY;  Service: Endoscopy;  Laterality: N/A;  . OVARIAN CYST REMOVAL    . TEE WITHOUT CARDIOVERSION N/A 02/01/2018   Procedure: TRANSESOPHAGEAL ECHOCARDIOGRAM (TEE);  Surgeon: [name], MD;  Location: [facility] ORS;  Service: Cardiovascular;  Laterality: N/A;   Patient Active Problem List   Diagnosis Date Noted  . Suspected COVID-19 virus infection 03/03/2020  . Schatzki's ring of distal esophagus 12/11/2019  . Erosive gastritis 11/30/2019  . Dysphagia 11/27/2019  . Painful swallowing 11/27/2019  . Gastroesophageal reflux disease 11/27/2019  . Stage 3a chronic kidney disease ([place]) 04/11/2019  . Coagulopathy ([place]) 09/12/2018  . Essential hypertension 09/12/2018  . CHF (congestive heart failure) ([place]) 08/15/2018  . Hypothyroidism due to acquired atrophy of thyroid 05/08/2018  . Unintentional weight loss 05/08/2018  . Myalgia due to statin 05/08/2018  . Abdominal aortic atherosclerosis ([place]) 05/07/2018  . Bradycardia 04/28/2018  . Pleural effusion on left 02/27/2018  . Moderate mitral stenosis 01/10/2018  . Lumbar radiculitis 01/07/2018  . Low back pain 09/02/2017  . B12 deficiency 07/26/2017  . Basophilia 07/26/2017  . Benign breast cyst  in female, left 10/07/2016  . Atrial fibrillation status post cardioversion ([place]) 04/06/2016  . Hospital discharge follow-up 03/10/2016  . Right ankle swelling 12/21/2015  . Vitamin D deficiency 04/08/2015  . Cervical spine degeneration 12/13/2014  . History of pulmonary embolism 12/02/2014  . Insomnia 10/04/2014  . Moderate tricuspid insufficiency 09/17/2014  . Generalized anxiety disorder 03/28/2013  . Mitral valve prolapse 03/28/2013  . Routine adult  health maintenance 03/23/2012  . Fatigue 03/23/2012  . Screening for breast cancer 11/29/2010  . Macular degeneration, left eye 11/29/2010  . Screening for colon cancer 11/29/2010  . Hyperlipidemia LDL goal <160 11/29/2010  . GERD (gastroesophageal reflux disease)       Prior to Admission medications   Medication Sig Start Date End Date Taking? Authorizing Provider  amiodarone (PACERONE) 200 MG tablet Take 200 mg by mouth 2 (two) times daily.   Yes [provider]  Bevacizumab (AVASTIN IV) 25 mg/mL by Intravitreal route. Every nine weeks   Yes [provider]  EUTHYROX 50 MCG tablet TAKE 1 TABLET BY MOUTH ONCE DAILY TAKE  30  MINUTES  PRIOR  TO  EATING Patient taking differently: Take 50 mcg by mouth daily before breakfast. 03/30/20  Yes [name], MD  metoprolol tartrate (LOPRESSOR) 25 MG tablet Take 25 mg by mouth 2 (two) times daily.   Yes [provider]  mirtazapine (REMERON) 15 MG tablet 1/2 to 1 tablet 30 minutes before bedtime daily Patient taking differently: Take 15 mg by mouth at bedtime. 11/22/19  Yes [name], MD  pantoprazole (PROTONIX) 20 MG tablet Take 20 mg by mouth daily. 11/27/19  Yes [provider]  XARELTO 15 MG TABS tablet Take 1 tablet by mouth once daily Patient taking differently: Take 15 mg by mouth daily. 10/24/19  Yes [name], MD    Allergies Statins    Social History Social History   Tobacco Use  . Smoking status: Never Smoker  . Smokeless tobacco: Never Used  . Tobacco comment: 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 , worked at [employer], Health visitor  . Vaping Use: Never used  Substance Use Topics  . Alcohol use: No  . Drug use: No    Review of Systems Patient denies headaches, rhinorrhea, blurry vision, numbness, shortness of breath, chest pain, edema, cough, abdominal pain, nausea, vomiting, diarrhea, dysuria, fevers, rashes or hallucinations unless otherwise stated above in  HPI. ____________________________________________   PHYSICAL EXAM:  VITAL SIGNS: Vitals:   04/02/20 1400 04/02/20 1500  BP: 133/67 (!) 151/68  Pulse: (!) 53 (!) 55  Resp: 20 (!) 28  Temp:    SpO2: 98% 97%    Constitutional: Alert and oriented.  Eyes: Conjunctivae are normal.  Head: Atraumatic. Nose: No congestion/rhinnorhea. Mouth/Throat: Mucous membranes are moist.   Neck: No stridor. Painless ROM.  Cardiovascular: Normal rate, regular rhythm. Grossly normal heart sounds.  Good peripheral circulation. Respiratory: Normal respiratory effort.  No retractions. Lungs CTAB. Gastrointestinal: Soft and nontender. No distention. No abdominal bruits. No CVA tenderness. Genitourinary:  Musculoskeletal: No lower extremity tenderness nor edema.  No joint effusions. Neurologic:  Normal speech and language. No gross focal neurologic deficits are appreciated. No facial droop Skin:  Skin is warm, dry and intact. No rash noted. Psychiatric: Mood and affect are normal. Speech and behavior are normal.  ____________________________________________   LABS (all labs ordered are listed, but only abnormal results are displayed)  Results for orders placed or performed during the hospital encounter of 04/02/20 (from the  past 24 hour(s))  CBC with Differential     Status: Abnormal   Collection Time: 04/02/20 10:21 AM  Result Value Ref Range   WBC 6.0 4.0 - 10.5 K/uL   RBC 3.82 (L) 3.87 - 5.11 MIL/uL   Hemoglobin 11.3 (L) 12.0 - 15.0 g/dL   HCT 36.0 36.0 - 46.0 %   MCV 94.2 80.0 - 100.0 fL   MCH 29.6 26.0 - 34.0 pg   MCHC 31.4 30.0 - 36.0 g/dL   RDW 18.2 (H) 11.5 - 15.5 %   Platelets 389 150 - 400 K/uL   nRBC 0.0 0.0 - 0.2 %   Neutrophils Relative % 70 %   Neutro Abs 4.2 1.7 - 7.7 K/uL   Lymphocytes Relative 17 %   Lymphs Abs 1.0 0.7 - 4.0 K/uL   Monocytes Relative 9 %   Monocytes Absolute 0.6 0.1 - 1.0 K/uL   Eosinophils Relative 1 %   Eosinophils Absolute 0.1 0.0 - 0.5 K/uL    Basophils Relative 2 %   Basophils Absolute 0.1 0.0 - 0.1 K/uL   Immature Granulocytes 1 %   Abs Immature Granulocytes 0.05 0.00 - 0.07 K/uL  Comprehensive metabolic panel     Status: Abnormal   Collection Time: 04/02/20 10:21 AM  Result Value Ref Range   Sodium 140 135 - 145 mmol/L   Potassium 4.5 3.5 - 5.1 mmol/L   Chloride 107 98 - 111 mmol/L   CO2 24 22 - 32 mmol/L   Glucose, Bld 112 (H) 70 - 99 mg/dL   BUN 19 8 - 23 mg/dL   Creatinine, Ser 1.19 (H) 0.44 - 1.00 mg/dL   Calcium 8.8 (L) 8.9 - 10.3 mg/dL   Total Protein 6.8 6.5 - 8.1 g/dL   Albumin 3.6 3.5 - 5.0 g/dL   AST 58 (H) 15 - 41 U/L   ALT 64 (H) 0 - 44 U/L   Alkaline Phosphatase 90 38 - 126 U/L   Total Bilirubin 1.0 0.3 - 1.2 mg/dL   GFR, Estimated 44 (L) >60 mL/min   Anion gap 9 5 - 15  Brain natriuretic peptide     Status: Abnormal   Collection Time: 04/02/20 10:21 AM  Result Value Ref Range   B Natriuretic Peptide 2,094.8 (H) 0.0 - 100.0 pg/mL  Troponin I (High Sensitivity)     Status: Abnormal   Collection Time: 04/02/20 10:21 AM  Result Value Ref Range   Troponin I (High Sensitivity) 22 (H) <18 ng/L  Troponin I (High Sensitivity)     Status: Abnormal   Collection Time: 04/02/20  1:03 PM  Result Value Ref Range   Troponin I (High Sensitivity) 22 (H) <18 ng/L   ____________________________________________  EKG My review and personal interpretation at Time: 9:51   Indication: weakness  Rate: 50  Rhythm: sinus Axis: left Other: normal intervals, no stemi ____________________________________________  RADIOLOGY  I personally reviewed all radiographic images ordered to evaluate for the above acute complaints and reviewed radiology reports and findings.  These findings were personally discussed with the patient.  Please see medical record for radiology report.  ____________________________________________   PROCEDURES  Procedure(s) performed:  Procedures    Critical Care performed:  no ____________________________________________   INITIAL IMPRESSION / ASSESSMENT AND PLAN / ED COURSE  Pertinent labs & imaging results that were available during my care of the patient were reviewed by me and considered in my medical decision making (see chart for details).   DDX: Asthma, copd, CHF, pna, ptx,  malignancy, Pe, anemia   Frances Kent is a 87 y.o. who presents to the ED with presentation as described above.  Patient frail-appearing but nontoxic no respiratory distress.  She is in sinus rhythm but slightly bradycardic.  Given her history and presentation may be secondary to long Covid, PE, symptomatic bradycardia or overmedication.  Chest x-ray without any clear effusion or pulmonary edema.  I will appreciating wheezing on exam.  Clinical Course as of 04/02/20 1527  Thu Apr 02, 2020  1150 Patient seems to be bouncing in and out of A. fib but predominately in sinus.  Heart rate is low in the 40s to low 50s.  No hypoxia at rest given her recent Covid illness I do think that CT angiogram is clinically indicated to exclude PE. [PR]  1519 Patient reassessed.  She would like to go home if at all possible.  Her troponins are stable she not having any chest pain.  We discussed Dr. [name] presentation and likely having some symptomatic bradycardia therefore recommendation will be to decrease her amiodarone to once daily.  Will arrange close outpatient follow up.  Have discussed with the patient and available family all diagnostics and treatments performed thus far and all questions were answered to the best of my ability. The patient demonstrates understanding and agreement with plan.  [PR]    Clinical Course User Index [PR] Merlyn Lot, MD    The patient was evaluated in Emergency Department today for the symptoms described in the history of present illness. He/she was evaluated in the context of the global COVID-19 pandemic, which necessitated consideration that the patient  might be at risk for infection with the SARS-CoV-2 virus that causes COVID-19. Institutional protocols and algorithms that pertain to the evaluation of patients at risk for COVID-19 are in a state of rapid change based on information released by regulatory bodies including the CDC and federal and state organizations. These policies and algorithms were followed during the patient's care in the ED.  As part of my medical decision making, I reviewed the following data within the [place] notes reviewed and incorporated, Labs reviewed, notes from prior ED visits and [place] Controlled Substance Database   ____________________________________________   FINAL CLINICAL IMPRESSION(S) / ED DIAGNOSES  Final diagnoses:  Weakness      NEW MEDICATIONS STARTED DURING THIS VISIT:  New Prescriptions   No medications on file     Note:  This document was prepared using Dragon voice recognition software and may include unintentional dictation errors.    Merlyn Lot, MD 04/02/20 [phone]

## 2020-04-02 NOTE — ED Triage Notes (Signed)
Pt states she was seen here Saturday for a fib and was supposed to have a cardioversion on Tuesday but did not get it done- pt states she cannot feel her heart racing, but does feel weaker than normal- pt states she also has a cough and shortness of breath

## 2020-04-03 ENCOUNTER — Inpatient Hospital Stay: Admit: 2020-04-03 | Payer: Medicare HMO

## 2020-04-03 DIAGNOSIS — I480 Paroxysmal atrial fibrillation: Secondary | ICD-10-CM | POA: Diagnosis not present

## 2020-04-03 DIAGNOSIS — I5020 Unspecified systolic (congestive) heart failure: Secondary | ICD-10-CM | POA: Diagnosis not present

## 2020-04-03 DIAGNOSIS — I10 Essential (primary) hypertension: Secondary | ICD-10-CM | POA: Diagnosis not present

## 2020-04-06 DIAGNOSIS — H353221 Exudative age-related macular degeneration, left eye, with active choroidal neovascularization: Secondary | ICD-10-CM | POA: Diagnosis not present

## 2020-04-06 DIAGNOSIS — I480 Paroxysmal atrial fibrillation: Secondary | ICD-10-CM | POA: Diagnosis not present

## 2020-04-06 DIAGNOSIS — I10 Essential (primary) hypertension: Secondary | ICD-10-CM | POA: Diagnosis not present

## 2020-04-06 DIAGNOSIS — R001 Bradycardia, unspecified: Secondary | ICD-10-CM | POA: Diagnosis not present

## 2020-04-06 DIAGNOSIS — I700 Atherosclerosis of aorta: Secondary | ICD-10-CM | POA: Diagnosis not present

## 2020-04-07 ENCOUNTER — Encounter: Admission: RE | Payer: Self-pay | Source: Home / Self Care

## 2020-04-07 ENCOUNTER — Ambulatory Visit: Admission: RE | Admit: 2020-04-07 | Payer: Medicare HMO | Source: Home / Self Care | Admitting: Internal Medicine

## 2020-04-07 SURGERY — CARDIOVERSION
Anesthesia: General

## 2020-05-05 ENCOUNTER — Other Ambulatory Visit: Payer: Self-pay | Admitting: Internal Medicine

## 2020-05-05 DIAGNOSIS — H353212 Exudative age-related macular degeneration, right eye, with inactive choroidal neovascularization: Secondary | ICD-10-CM | POA: Diagnosis not present

## 2020-05-07 DIAGNOSIS — I495 Sick sinus syndrome: Secondary | ICD-10-CM | POA: Diagnosis not present

## 2020-05-07 DIAGNOSIS — Z86711 Personal history of pulmonary embolism: Secondary | ICD-10-CM | POA: Diagnosis not present

## 2020-05-07 DIAGNOSIS — I050 Rheumatic mitral stenosis: Secondary | ICD-10-CM | POA: Diagnosis not present

## 2020-05-07 DIAGNOSIS — I480 Paroxysmal atrial fibrillation: Secondary | ICD-10-CM | POA: Diagnosis not present

## 2020-05-07 DIAGNOSIS — M791 Myalgia, unspecified site: Secondary | ICD-10-CM | POA: Diagnosis not present

## 2020-05-07 DIAGNOSIS — E782 Mixed hyperlipidemia: Secondary | ICD-10-CM | POA: Diagnosis not present

## 2020-05-07 DIAGNOSIS — I5020 Unspecified systolic (congestive) heart failure: Secondary | ICD-10-CM | POA: Diagnosis not present

## 2020-05-07 DIAGNOSIS — R001 Bradycardia, unspecified: Secondary | ICD-10-CM | POA: Diagnosis not present

## 2020-05-07 DIAGNOSIS — I700 Atherosclerosis of aorta: Secondary | ICD-10-CM | POA: Diagnosis not present

## 2020-05-07 DIAGNOSIS — I779 Disorder of arteries and arterioles, unspecified: Secondary | ICD-10-CM | POA: Diagnosis not present

## 2020-05-07 DIAGNOSIS — I6523 Occlusion and stenosis of bilateral carotid arteries: Secondary | ICD-10-CM | POA: Diagnosis not present

## 2020-05-21 DIAGNOSIS — R001 Bradycardia, unspecified: Secondary | ICD-10-CM | POA: Diagnosis not present

## 2020-05-21 DIAGNOSIS — R0602 Shortness of breath: Secondary | ICD-10-CM | POA: Diagnosis not present

## 2020-05-21 DIAGNOSIS — I5020 Unspecified systolic (congestive) heart failure: Secondary | ICD-10-CM | POA: Diagnosis not present

## 2020-05-21 DIAGNOSIS — R778 Other specified abnormalities of plasma proteins: Secondary | ICD-10-CM | POA: Insufficient documentation

## 2020-05-21 DIAGNOSIS — I10 Essential (primary) hypertension: Secondary | ICD-10-CM | POA: Diagnosis not present

## 2020-05-21 DIAGNOSIS — I480 Paroxysmal atrial fibrillation: Secondary | ICD-10-CM | POA: Diagnosis not present

## 2020-05-25 DIAGNOSIS — R001 Bradycardia, unspecified: Secondary | ICD-10-CM | POA: Diagnosis not present

## 2020-05-25 DIAGNOSIS — I480 Paroxysmal atrial fibrillation: Secondary | ICD-10-CM | POA: Diagnosis not present

## 2020-06-05 ENCOUNTER — Other Ambulatory Visit: Payer: Self-pay | Admitting: Gastroenterology

## 2020-06-05 ENCOUNTER — Other Ambulatory Visit (HOSPITAL_COMMUNITY): Payer: Self-pay | Admitting: Gastroenterology

## 2020-06-05 DIAGNOSIS — K222 Esophageal obstruction: Secondary | ICD-10-CM

## 2020-06-05 DIAGNOSIS — R1319 Other dysphagia: Secondary | ICD-10-CM

## 2020-06-08 ENCOUNTER — Encounter: Payer: Self-pay | Admitting: Internal Medicine

## 2020-06-08 ENCOUNTER — Ambulatory Visit
Admission: RE | Admit: 2020-06-08 | Discharge: 2020-06-08 | Disposition: A | Discharge status: Home / Self Care | Payer: Medicare HMO | Source: Ambulatory Visit | Attending: Gastroenterology | Admitting: Gastroenterology

## 2020-06-08 ENCOUNTER — Ambulatory Visit (INDEPENDENT_AMBULATORY_CARE_PROVIDER_SITE_OTHER): Payer: Medicare HMO | Admitting: Internal Medicine

## 2020-06-08 ENCOUNTER — Other Ambulatory Visit: Payer: Self-pay

## 2020-06-08 VITALS — BP 136/66 | HR 65 | Temp 97.8°F | Resp 15 | Ht 63.0 in | Wt 109.4 lb

## 2020-06-08 DIAGNOSIS — R1310 Dysphagia, unspecified: Secondary | ICD-10-CM | POA: Diagnosis not present

## 2020-06-08 DIAGNOSIS — R5383 Other fatigue: Secondary | ICD-10-CM | POA: Diagnosis not present

## 2020-06-08 DIAGNOSIS — I4891 Unspecified atrial fibrillation: Secondary | ICD-10-CM | POA: Diagnosis not present

## 2020-06-08 DIAGNOSIS — U099 Post covid-19 condition, unspecified: Secondary | ICD-10-CM | POA: Diagnosis not present

## 2020-06-08 DIAGNOSIS — M5416 Radiculopathy, lumbar region: Secondary | ICD-10-CM

## 2020-06-08 DIAGNOSIS — F411 Generalized anxiety disorder: Secondary | ICD-10-CM | POA: Diagnosis not present

## 2020-06-08 DIAGNOSIS — R001 Bradycardia, unspecified: Secondary | ICD-10-CM | POA: Diagnosis not present

## 2020-06-08 DIAGNOSIS — R1319 Other dysphagia: Secondary | ICD-10-CM | POA: Insufficient documentation

## 2020-06-08 DIAGNOSIS — K222 Esophageal obstruction: Secondary | ICD-10-CM | POA: Diagnosis not present

## 2020-06-08 DIAGNOSIS — R5382 Chronic fatigue, unspecified: Secondary | ICD-10-CM

## 2020-06-08 DIAGNOSIS — I5020 Unspecified systolic (congestive) heart failure: Secondary | ICD-10-CM | POA: Diagnosis not present

## 2020-06-08 NOTE — Patient Instructions (Addendum)
Your fatigue is due to several factors:  1) not enough sleep   You may use to 1/2 to 1 tablet of  alprazolam to help  You sleep if you have trouble.  (wait 15 minutes from when your head hits the pillow.  Only take if you are NOT sleepy or if you wake up and can't go BACK to sleep   2) Not enough exercise :  I am referring you to [facility] 's cardiopulmonary rehabilitation program.  This is a supervised physical therapy exercise program designed for people with lung and/or heart disease, to improve your endurance   Return in one month

## 2020-06-08 NOTE — Progress Notes (Signed)
Subjective:  Patient ID: Frances Kent, female    DOB: 1930-10-25  Age: 87 y.o. MRN: [id number]  CC: The primary encounter diagnosis was Post-COVID chronic fatigue. Diagnoses of Atrial fibrillation status post cardioversion [facility]), Systolic congestive heart failure, unspecified HF chronicity ([place]), Fatigue, unspecified type, Lumbar radiculitis, Generalized anxiety disorder, Bradycardia, and Esophageal dysphagia were also pertinent to this visit.  HPI Frances Kent presents for follow up on post COVID fatigue.    Frances Kent is an 87 yr old female with PAF s/p cardioversion,  History of PE,  Systolic dysfunction EF 02% by 2020 ECHO and history of COVID 19 infection who presents with the cc of " Legs feel weak after walking. "  She states that she is able to rise from the chair easily , but  legs feel tired as soon as she starts to walk . She denies leg pain/claudication /hip pain .  She  reports that she f eels "like it's just harder to breathe" sometimes but denies chest tightness and shortness of breath. The difficulty breathing Occurs both at rest and with exertion .  She recently underwent  Evaluation with Holter monitor by Dr [name] with the following conclusion via report:   "Baseline normal sinus rhythm with maximum heart rate with 170 bpm minimum of 34 beats minute average of 51 bpm. There is a rare preatrial and preventricular contraction. Heart rate is variable depending on activity levels with no evidence"of atrial fibrillation but short runs of supraventricular tachycardia of 3 and 4 beats. "   She is not sleepig well. Has been using tylenol PM  Has not tried the alprazolam .  Stopped the remeron .  Still having dysphagia with a recent episode of regurgitation of medications and a barium swallow is planned (April 22 phone note fro Dr Haig Prophet, reviewed via [place])  "Not anxious"  But wants something to "calm her down."   Outpatient Medications Prior to Visit  Medication Sig  Dispense Refill  . Bevacizumab (AVASTIN IV) 25 mg/mL by Intravitreal route. Every nine weeks    . EUTHYROX 50 MCG tablet TAKE 1 TABLET BY MOUTH ONCE DAILY TAKE  30  MINUTES  PRIOR  TO  EATING (Patient taking differently: Take 50 mcg by mouth daily before breakfast.) 90 tablet 0  . metoprolol tartrate (LOPRESSOR) 25 MG tablet Take 25 mg by mouth 2 (two) times daily.    . pantoprazole (PROTONIX) 20 MG tablet Take 20 mg by mouth daily.    Frances Kent 15 MG TABS tablet Take 1 tablet by mouth once daily (Patient taking differently: Take 15 mg by mouth daily.) 90 tablet 0  . amiodarone (PACERONE) 200 MG tablet Take 200 mg by mouth 2 (two) times daily. (Patient not taking: Reported on 06/08/2020)    . mirtazapine (REMERON) 15 MG tablet TAKE ONE-HALF TO ONE TABLET BY MOUTH 30 MINUTES BEFORE BEDTIME (Patient not taking: Reported on 06/08/2020) 10 tablet 0   No facility-administered medications prior to visit.    Review of Systems;  Patient denies headache, fevers, malaise, unintentional weight loss, skin rash, eye pain, sinus congestion and sinus pain, sore throat, dysphagia,  hemoptysis , cough, dyspnea, wheezing, chest pain, palpitations, orthopnea, edema, abdominal pain, nausea, melena, diarrhea, constipation, flank pain, dysuria, hematuria, urinary  Frequency, nocturia, numbness, tingling, seizures,  Focal weakness, Loss of consciousness,  Tremor, insomnia, depression, anxiety, and suicidal ideation.      Objective:  BP 136/66 (BP Location: Left Arm, Patient Position: Sitting, Cuff Size: Normal)  Pulse 65   Temp 97.8 F (36.6 C) (Oral)   Resp 15   Ht 5\' 3"  (1.6 m)   Wt 109 lb 6.4 oz (49.6 kg)   SpO2 97%   BMI 19.38 kg/m   BP Readings from Last 3 Encounters:  06/08/20 136/66  04/02/20 (!) 151/68  03/28/20 129/82    Wt Readings from Last 3 Encounters:  06/08/20 109 lb 6.4 oz (49.6 kg)  04/02/20 106 lb (48.1 kg)  03/28/20 103 lb 2.8 oz (46.8 kg)    General appearance: alert,  cooperative and appears stated age Ears: normal TM's and external ear canals both ears Throat: lips, mucosa, and tongue normal; teeth and gums normal Neck: no adenopathy, no carotid bruit, supple, symmetrical, trachea midline and thyroid not enlarged, symmetric, no tenderness/mass/nodules Back: symmetric, no curvature. ROM normal. No CVA tenderness. Lungs: clear to auscultation bilaterally Heart: regular rate and rhythm, S1, S2 normal, no murmur, click, rub or gallop Abdomen: soft, non-tender; bowel sounds normal; no masses,  no organomegaly Pulses: 2+ and symmetric Skin: Skin color, texture, turgor normal. No rashes or lesions Lymph nodes: Cervical, supraclavicular, and axillary nodes normal.  No results found for: HGBA1C  Lab Results  Component Value Date   CREATININE 1.19 (H) 04/02/2020   CREATININE 1.27 (H) 03/28/2020   CREATININE 1.59 (H) 03/09/2020    Lab Results  Component Value Date   WBC 6.0 04/02/2020   HGB 11.3 (L) 04/02/2020   HCT 36.0 04/02/2020   PLT 389 04/02/2020   GLUCOSE 112 (H) 04/02/2020   CHOL 284 (H) 12/11/2019   TRIG 91.0 12/11/2019   HDL 100.10 12/11/2019   LDLDIRECT 185.3 03/27/2013   LDLCALC 166 (H) 12/11/2019   ALT 64 (H) 04/02/2020   AST 58 (H) 04/02/2020   NA 140 04/02/2020   K 4.5 04/02/2020   CL 107 04/02/2020   CREATININE 1.19 (H) 04/02/2020   BUN 19 04/02/2020   CO2 24 04/02/2020   TSH 1.85 12/11/2019   INR 1.03 03/04/2016    No results found.  Assessment & Plan:   Problem List Items Addressed This Visit      Unprioritized   Atrial fibrillation status post cardioversion (HCC)   Bradycardia    Recent Holter monitor report reviewed:  Holter monitor  87 year old female with atrial fibrillation  Baseline normal sinus rhythm with maximum heart rate with 170 bpm minimum of 34 beats minute average of 51 bpm. There is a rare preatrial and preventricular contraction. Heart rate is variable depending on activity levels with no evidence  of atrial fibrillation but short runs of supraventricular tachycardia of 3 and 4 beats.        CHF (congestive heart failure) ([place])    Presumed non ischemic by 2020 myoview, with mild LVH , puilmonary hypertension and mild mitral valve regurgitation. She has regular follow up with her cardiologist Dr [name], and per his April 2022 office note,  There is no reversible cause for her symptoms of dyspnea:  "No further intervention shortness of breath reported above multifactorial in nature including age, decreased exercise tolerance, disability, and/or medication management. The patient is to follow for any worsening symptoms or increase in severity for further in need in investigation or treatment options.  -Furosemide for lower extremity edema with continued discussion of other home treatment options including the DASH diet and or compression hose -Continue current medical regimen for hypertension control which is stable at this time and without apparent significant side effects or symptoms of medications. Further  treatment goals of low sodium diet for additive effects of these medications have been discussed today as well.  -No change in current and appropriate medication management of atrial fibrillation and coexisting risk factors"  Therefore I am once again referring her to Cardiopulmonary rehab to improve her endurance       Dysphagia    Secondary to Schatzki's ring noted on EGD Nov 2021.  Advised to Continue PPI for life.       Fatigue   Relevant Orders   TSH   CBC with Differential/Platelet   Vitamin B12   Comprehensive metabolic panel   Generalized anxiety disorder    She did not tolerate trial of paxil but prefers to use alprazolam prn.  She has discontinued remeron and has been using tylenol PM for insomnia.  The risks and benefits of benzodiazepine use were reviewed with patient today including excessive sedation leading to respiratory depression,  impaired thinking/driving, and  addiction.  Patient was advised to avoid concurrent use with alcohol, to use medication only as needed and not to share with others  .       Lumbar radiculitis    Her untreated lumbar stenosis may be the cause of her reported leg weakness.  Cardiopulmonary rehab consult ordered;  Will follow up and consider PT if there is no signs of C/P causes.        Other Visit Diagnoses    Post-COVID chronic fatigue    -  Primary      I have discontinued Frances Kent's mirtazapine. I am also having her maintain her Xarelto, pantoprazole, Bevacizumab (AVASTIN IV), amiodarone, Euthyrox, and metoprolol tartrate.  No orders of the defined types were placed in this encounter.   Medications Discontinued During This Encounter  Medication Reason  . mirtazapine (REMERON) 15 MG tablet     Follow-up: Return in about 4 weeks (around 07/06/2020).   [name], MD

## 2020-06-09 ENCOUNTER — Other Ambulatory Visit: Payer: Self-pay | Admitting: *Deleted

## 2020-06-09 DIAGNOSIS — R0600 Dyspnea, unspecified: Secondary | ICD-10-CM

## 2020-06-09 DIAGNOSIS — U099 Post covid-19 condition, unspecified: Secondary | ICD-10-CM

## 2020-06-09 LAB — TSH: TSH: 1.16 u[IU]/mL (ref 0.35–4.50)

## 2020-06-09 LAB — COMPREHENSIVE METABOLIC PANEL
ALT: 30 U/L (ref 0–35)
AST: 29 U/L (ref 0–37)
Albumin: 3.8 g/dL (ref 3.5–5.2)
Alkaline Phosphatase: 105 U/L (ref 39–117)
BUN: 11 mg/dL (ref 6–23)
CO2: 29 mEq/L (ref 19–32)
Calcium: 9.3 mg/dL (ref 8.4–10.5)
Chloride: 105 mEq/L (ref 96–112)
Creatinine, Ser: 1.04 mg/dL (ref 0.40–1.20)
GFR: 48.24 mL/min — ABNORMAL LOW (ref >60.00)
Glucose, Bld: 126 mg/dL — ABNORMAL HIGH (ref 70–99)
Potassium: 4.1 mEq/L (ref 3.5–5.1)
Sodium: 141 mEq/L (ref 135–145)
Total Bilirubin: 0.7 mg/dL (ref 0.2–1.2)
Total Protein: 6.7 g/dL (ref 6.0–8.3)

## 2020-06-09 LAB — CBC WITH DIFFERENTIAL/PLATELET
Basophils Absolute: 0.1 10*3/uL (ref 0.0–0.1)
Basophils Relative: 2.2 % (ref 0.0–3.0)
Eosinophils Absolute: 0 10*3/uL (ref 0.0–0.7)
Eosinophils Relative: 0.7 % (ref 0.0–5.0)
HCT: 35.5 % — ABNORMAL LOW (ref 36.0–46.0)
Hemoglobin: 11.6 g/dL — ABNORMAL LOW (ref 12.0–15.0)
Lymphocytes Relative: 20.2 % (ref 12.0–46.0)
Lymphs Abs: 0.9 10*3/uL (ref 0.7–4.0)
MCHC: 32.7 g/dL (ref 30.0–36.0)
MCV: 90 fl (ref 78.0–100.0)
Monocytes Absolute: 0.5 10*3/uL (ref 0.1–1.0)
Monocytes Relative: 11.4 % (ref 3.0–12.0)
Neutro Abs: 2.9 10*3/uL (ref 1.4–7.7)
Neutrophils Relative %: 65.5 % (ref 43.0–77.0)
Platelets: 268 10*3/uL (ref 150.0–400.0)
RBC: 3.94 Mil/uL (ref 3.87–5.11)
RDW: 16.2 % — ABNORMAL HIGH (ref 11.5–15.5)
WBC: 4.5 10*3/uL (ref 4.0–10.5)

## 2020-06-09 LAB — VITAMIN B12: Vitamin B-12: 810 pg/mL (ref 211–911)

## 2020-06-09 NOTE — Assessment & Plan Note (Signed)
Recent Holter monitor report reviewed:  Holter monitor  87 year old female with atrial fibrillation  Baseline normal sinus rhythm with maximum heart rate with 170 bpm minimum of 34 beats minute average of 51 bpm. There is a rare preatrial and preventricular contraction. Heart rate is variable depending on activity levels with no evidence of atrial fibrillation but short runs of supraventricular tachycardia of 3 and 4 beats.

## 2020-06-09 NOTE — Assessment & Plan Note (Signed)
She did not tolerate trial of paxil but prefers to use alprazolam prn.  She has discontinued remeron and has been using tylenol PM for insomnia.  The risks and benefits of benzodiazepine use were reviewed with patient today including excessive sedation leading to respiratory depression,  impaired thinking/driving, and addiction.  Patient was advised to avoid concurrent use with alcohol, to use medication only as needed and not to share with others  .

## 2020-06-09 NOTE — Assessment & Plan Note (Addendum)
Etiology unclear.  She underwent EGD Nov 2021 and no Schatzki's ring was found.  She was advised by GI to have a barium swallow, and this was ordered .  Advised to Continue PPI for life.

## 2020-06-09 NOTE — Assessment & Plan Note (Signed)
Presumed non ischemic by 2020 myoview, with mild LVH , puilmonary hypertension and mild mitral valve regurgitation. She has regular follow up with her cardiologist Dr [name], and per his April 2022 office note,  There is no reversible cause for her symptoms of dyspnea:  "No further intervention shortness of breath reported above multifactorial in nature including age, decreased exercise tolerance, disability, and/or medication management. The patient is to follow for any worsening symptoms or increase in severity for further in need in investigation or treatment options.  -Furosemide for lower extremity edema with continued discussion of other home treatment options including the DASH diet and or compression hose -Continue current medical regimen for hypertension control which is stable at this time and without apparent significant side effects or symptoms of medications. Further treatment goals of low sodium diet for additive effects of these medications have been discussed today as well.  -No change in current and appropriate medication management of atrial fibrillation and coexisting risk factors"  Therefore I am once again referring her to Cardiopulmonary rehab to improve her endurance

## 2020-06-09 NOTE — Assessment & Plan Note (Signed)
Her untreated lumbar stenosis may be the cause of her reported leg weakness.  Cardiopulmonary rehab consult ordered;  Will follow up and consider PT if there is no signs of C/P causes.

## 2020-06-16 ENCOUNTER — Other Ambulatory Visit: Payer: Self-pay | Admitting: Internal Medicine

## 2020-06-16 DIAGNOSIS — H353221 Exudative age-related macular degeneration, left eye, with active choroidal neovascularization: Secondary | ICD-10-CM | POA: Diagnosis not present

## 2020-06-20 ENCOUNTER — Other Ambulatory Visit: Payer: Self-pay | Admitting: Internal Medicine

## 2020-06-30 DIAGNOSIS — H353212 Exudative age-related macular degeneration, right eye, with inactive choroidal neovascularization: Secondary | ICD-10-CM | POA: Diagnosis not present

## 2020-07-06 ENCOUNTER — Other Ambulatory Visit: Payer: Self-pay | Admitting: Internal Medicine

## 2020-07-07 DIAGNOSIS — I050 Rheumatic mitral stenosis: Secondary | ICD-10-CM | POA: Diagnosis not present

## 2020-07-07 DIAGNOSIS — I10 Essential (primary) hypertension: Secondary | ICD-10-CM | POA: Diagnosis not present

## 2020-07-07 DIAGNOSIS — I480 Paroxysmal atrial fibrillation: Secondary | ICD-10-CM | POA: Diagnosis not present

## 2020-07-07 DIAGNOSIS — I341 Nonrheumatic mitral (valve) prolapse: Secondary | ICD-10-CM | POA: Diagnosis not present

## 2020-07-10 ENCOUNTER — Other Ambulatory Visit: Payer: Self-pay | Admitting: Internal Medicine

## 2020-07-12 ENCOUNTER — Emergency Department: Payer: Medicare HMO

## 2020-07-12 ENCOUNTER — Other Ambulatory Visit: Payer: Self-pay

## 2020-07-12 ENCOUNTER — Emergency Department
Admission: EM | Admit: 2020-07-12 | Discharge: 2020-07-12 | Disposition: A | Discharge status: Home / Self Care | Payer: Medicare HMO | Attending: Emergency Medicine | Admitting: Emergency Medicine

## 2020-07-12 ENCOUNTER — Encounter: Payer: Self-pay | Admitting: Emergency Medicine

## 2020-07-12 DIAGNOSIS — Z7901 Long term (current) use of anticoagulants: Secondary | ICD-10-CM | POA: Insufficient documentation

## 2020-07-12 DIAGNOSIS — R531 Weakness: Secondary | ICD-10-CM | POA: Insufficient documentation

## 2020-07-12 DIAGNOSIS — R0602 Shortness of breath: Secondary | ICD-10-CM | POA: Diagnosis not present

## 2020-07-12 DIAGNOSIS — A419 Sepsis, unspecified organism: Secondary | ICD-10-CM | POA: Diagnosis not present

## 2020-07-12 DIAGNOSIS — Z79899 Other long term (current) drug therapy: Secondary | ICD-10-CM | POA: Insufficient documentation

## 2020-07-12 DIAGNOSIS — N1831 Chronic kidney disease, stage 3a: Secondary | ICD-10-CM | POA: Diagnosis not present

## 2020-07-12 DIAGNOSIS — I517 Cardiomegaly: Secondary | ICD-10-CM | POA: Diagnosis not present

## 2020-07-12 DIAGNOSIS — Z20822 Contact with and (suspected) exposure to covid-19: Secondary | ICD-10-CM | POA: Diagnosis not present

## 2020-07-12 DIAGNOSIS — I130 Hypertensive heart and chronic kidney disease with heart failure and stage 1 through stage 4 chronic kidney disease, or unspecified chronic kidney disease: Secondary | ICD-10-CM | POA: Insufficient documentation

## 2020-07-12 DIAGNOSIS — E039 Hypothyroidism, unspecified: Secondary | ICD-10-CM | POA: Insufficient documentation

## 2020-07-12 DIAGNOSIS — Z8616 Personal history of COVID-19: Secondary | ICD-10-CM | POA: Diagnosis not present

## 2020-07-12 DIAGNOSIS — G4700 Insomnia, unspecified: Secondary | ICD-10-CM | POA: Diagnosis not present

## 2020-07-12 DIAGNOSIS — I509 Heart failure, unspecified: Secondary | ICD-10-CM | POA: Diagnosis not present

## 2020-07-12 DIAGNOSIS — I480 Paroxysmal atrial fibrillation: Secondary | ICD-10-CM | POA: Diagnosis not present

## 2020-07-12 DIAGNOSIS — J90 Pleural effusion, not elsewhere classified: Secondary | ICD-10-CM | POA: Diagnosis not present

## 2020-07-12 DIAGNOSIS — N390 Urinary tract infection, site not specified: Secondary | ICD-10-CM | POA: Diagnosis not present

## 2020-07-12 DIAGNOSIS — I4891 Unspecified atrial fibrillation: Secondary | ICD-10-CM | POA: Diagnosis not present

## 2020-07-12 LAB — TROPONIN I (HIGH SENSITIVITY)
Troponin I (High Sensitivity): 22 ng/L — ABNORMAL HIGH (ref <18)
Troponin I (High Sensitivity): 23 ng/L — ABNORMAL HIGH (ref <18)

## 2020-07-12 LAB — BRAIN NATRIURETIC PEPTIDE: B Natriuretic Peptide: 2353.6 pg/mL — ABNORMAL HIGH (ref 0.0–100.0)

## 2020-07-12 LAB — BASIC METABOLIC PANEL
Anion gap: 13 (ref 5–15)
BUN: 23 mg/dL (ref 8–23)
CO2: 23 mmol/L (ref 22–32)
Calcium: 9.1 mg/dL (ref 8.9–10.3)
Chloride: 104 mmol/L (ref 98–111)
Creatinine, Ser: 1.15 mg/dL — ABNORMAL HIGH (ref 0.44–1.00)
GFR, Estimated: 46 mL/min — ABNORMAL LOW (ref >60)
Glucose, Bld: 117 mg/dL — ABNORMAL HIGH (ref 70–99)
Potassium: 4.1 mmol/L (ref 3.5–5.1)
Sodium: 140 mmol/L (ref 135–145)

## 2020-07-12 LAB — URINALYSIS, COMPLETE (UACMP) WITH MICROSCOPIC
Bilirubin Urine: NEGATIVE
Glucose, UA: NEGATIVE mg/dL
Ketones, ur: NEGATIVE mg/dL
Leukocytes,Ua: NEGATIVE
Nitrite: NEGATIVE
Protein, ur: 100 mg/dL — AB
Specific Gravity, Urine: 1.028 (ref 1.005–1.030)
pH: 5 (ref 5.0–8.0)

## 2020-07-12 LAB — CBC WITH DIFFERENTIAL/PLATELET
Abs Immature Granulocytes: 0.03 10*3/uL (ref 0.00–0.07)
Basophils Absolute: 0.1 10*3/uL (ref 0.0–0.1)
Basophils Relative: 2 %
Eosinophils Absolute: 0 10*3/uL (ref 0.0–0.5)
Eosinophils Relative: 0 %
HCT: 37.7 % (ref 36.0–46.0)
Hemoglobin: 12.3 g/dL (ref 12.0–15.0)
Immature Granulocytes: 0 %
Lymphocytes Relative: 17 %
Lymphs Abs: 1.2 10*3/uL (ref 0.7–4.0)
MCH: 29.1 pg (ref 26.0–34.0)
MCHC: 32.6 g/dL (ref 30.0–36.0)
MCV: 89.1 fL (ref 80.0–100.0)
Monocytes Absolute: 0.7 10*3/uL (ref 0.1–1.0)
Monocytes Relative: 10 %
Neutro Abs: 4.7 10*3/uL (ref 1.7–7.7)
Neutrophils Relative %: 71 %
Platelets: 277 10*3/uL (ref 150–400)
RBC: 4.23 MIL/uL (ref 3.87–5.11)
RDW: 14.7 % (ref 11.5–15.5)
WBC: 6.7 10*3/uL (ref 4.0–10.5)
nRBC: 0 % (ref 0.0–0.2)

## 2020-07-12 LAB — RESP PANEL BY RT-PCR (FLU A&B, COVID) ARPGX2
Influenza A by PCR: NEGATIVE
Influenza B by PCR: NEGATIVE
SARS Coronavirus 2 by RT PCR: NEGATIVE

## 2020-07-12 LAB — MAGNESIUM: Magnesium: 2.4 mg/dL (ref 1.7–2.4)

## 2020-07-12 MED ORDER — METOPROLOL TARTRATE 5 MG/5ML IV SOLN
5.0000 mg | INTRAVENOUS | Status: DC | PRN
  Administered 2020-07-12 (×2): 5 mg via INTRAVENOUS
  Filled 2020-07-12 (×2): qty 5

## 2020-07-12 MED ORDER — FUROSEMIDE 20 MG PO TABS: 20.0000 mg | ORAL_TABLET | Freq: Every day | ORAL | 0 refills | Status: DC

## 2020-07-12 MED ORDER — POTASSIUM CHLORIDE CRYS ER 20 MEQ PO TBCR: 20.0000 meq | EXTENDED_RELEASE_TABLET | Freq: Every day | ORAL | 0 refills | Status: DC

## 2020-07-12 NOTE — ED Notes (Signed)
Pt ambulatory to the restroom at this time.  

## 2020-07-12 NOTE — ED Notes (Signed)
Dr. Charna Archer, EDP at bedside.

## 2020-07-12 NOTE — ED Triage Notes (Signed)
Pt reports over the last several days has felt really weak and SOB. Pt reports hx of a-fib but reports it is controlled

## 2020-07-12 NOTE — ED Provider Notes (Signed)
[facility] Emergency Department Provider Note   ____________________________________________   Event Date/Time   First MD Initiated Contact with Patient 07/12/20 1054     (approximate)  I have reviewed the triage vital signs and the nursing notes.   HISTORY  Chief Complaint Weakness and Shortness of Breath    HPI Frances Kent is a 87 y.o. female with past medical history of hypertension mitral regurg, paroxysmal atrial fibrillation on Xarelto, CHF, and CKD who presents to the ED for weakness and shortness of breath.  Patient reports that she has been feeling increasingly weak for about the past [redacted] weeks along with intermittent episodes of shortness of breath.  She states she does not feel short of breath currently, but it seems to come on with exertion and when she lies flat.  She has not noticed any pain or swelling in her legs and she denies any pain in her chest.  She has not had any fevers or cough, denies any vomiting, diarrhea, dysuria, or hematuria.  She saw her cardiologist last week and was told that she is in atrial fibrillation, had her metoprolol increased to 25 mg twice daily.  She states that she took her morning dose earlier today but continues to feel like her heart is racing at times.  She continues to take Xarelto for her atrial fibrillation.        Past Medical History:  Diagnosis Date  . Atrial fibrillation ([place])   . Colon adenomas   . GERD (gastroesophageal reflux disease)   . Hypertension   . Hypothyroidism   . Macular degeneration   . Mitral regurgitation   . Pulmonary embolism ([place]) 02/2016  . SVT (supraventricular tachycardia) [facility])     Patient Active Problem List   Diagnosis Date Noted  . Suspected COVID-19 virus infection 03/03/2020  . Schatzki's ring of distal esophagus 12/11/2019  . Erosive gastritis 11/30/2019  . Dysphagia 11/27/2019  . Painful swallowing 11/27/2019  . Gastroesophageal reflux disease 11/27/2019   . Stage 3a chronic kidney disease ([place]) 04/11/2019  . Coagulopathy ([place]) 09/12/2018  . Essential hypertension 09/12/2018  . CHF (congestive heart failure) ([place]) 08/15/2018  . Hypothyroidism due to acquired atrophy of thyroid 05/08/2018  . Unintentional weight loss 05/08/2018  . Myalgia due to statin 05/08/2018  . Abdominal aortic atherosclerosis ([place]) 05/07/2018  . Bradycardia 04/28/2018  . Pleural effusion on left 02/27/2018  . Moderate mitral stenosis 01/10/2018  . Lumbar radiculitis 01/07/2018  . Low back pain 09/02/2017  . B12 deficiency 07/26/2017  . Basophilia 07/26/2017  . Benign breast cyst in female, left 10/07/2016  . Atrial fibrillation status post cardioversion ([place]) 04/06/2016  . Hospital discharge follow-up 03/10/2016  . Right ankle swelling 12/21/2015  . Vitamin D deficiency 04/08/2015  . Cervical spine degeneration 12/13/2014  . History of pulmonary embolism 12/02/2014  . Insomnia 10/04/2014  . Moderate tricuspid insufficiency 09/17/2014  . Generalized anxiety disorder 03/28/2013  . Mitral valve prolapse 03/28/2013  . Routine adult health maintenance 03/23/2012  . Fatigue 03/23/2012  . Screening for breast cancer 11/29/2010  . Macular degeneration, left eye 11/29/2010  . Screening for colon cancer 11/29/2010  . Hyperlipidemia LDL goal <160 11/29/2010  . GERD (gastroesophageal reflux disease)     Past Surgical History:  Procedure Laterality Date  . APPENDECTOMY  1960  . BREAST CYST ASPIRATION Left 2017  . CARDIOVERSION N/A 04/05/2016   Procedure: Cardioversion;  Surgeon: [name], MD;  Location: [facility] ORS;  Service: Cardiovascular;  Laterality: N/A;  . CHOLECYSTECTOMY  1985  . ESOPHAGOGASTRODUODENOSCOPY (EGD) WITH PROPOFOL N/A 11/27/2019   Procedure: ESOPHAGOGASTRODUODENOSCOPY (EGD) WITH PROPOFOL;  Surgeon: Lesly Rubenstein, MD;  Location: [facility] ENDOSCOPY;  Service: Endoscopy;  Laterality: N/A;  . ESOPHAGOGASTRODUODENOSCOPY (EGD) WITH PROPOFOL N/A  12/24/2019   Procedure: ESOPHAGOGASTRODUODENOSCOPY (EGD) WITH PROPOFOL;  Surgeon: Lesly Rubenstein, MD;  Location: [facility] ENDOSCOPY;  Service: Endoscopy;  Laterality: N/A;  . OVARIAN CYST REMOVAL    . TEE WITHOUT CARDIOVERSION N/A 02/01/2018   Procedure: TRANSESOPHAGEAL ECHOCARDIOGRAM (TEE);  Surgeon: [name], MD;  Location: [facility] ORS;  Service: Cardiovascular;  Laterality: N/A;    Prior to Admission medications   Medication Sig Start Date End Date Taking? Authorizing Provider  furosemide (LASIX) 20 MG tablet Take 1 tablet (20 mg total) by mouth daily for 5 days. 07/12/20 07/17/20 Yes Blake Divine, MD  potassium chloride SA (KLOR-CON) 20 MEQ tablet Take 1 tablet (20 mEq total) by mouth daily for 5 days. 07/12/20 07/17/20 Yes Blake Divine, MD  amiodarone (PACERONE) 200 MG tablet Take 200 mg by mouth 2 (two) times daily. Patient not taking: Reported on 06/08/2020    [provider]  Bevacizumab (AVASTIN IV) 25 mg/mL by Intravitreal route. Every nine weeks    [provider]  EUTHYROX 50 MCG tablet TAKE 1 TABLET BY MOUTH ONCE DAILY 30  MINUTES  PRIOR  TO  EATING 07/06/20   [name], MD  metoprolol tartrate (LOPRESSOR) 25 MG tablet Take 25 mg by mouth 2 (two) times daily.    [provider]  pantoprazole (PROTONIX) 20 MG tablet Take 20 mg by mouth daily. 11/27/19   [provider]  XARELTO 15 MG TABS tablet TAKE 1 TABLET EVERY DAY 06/22/20   [name], MD    Allergies Statins  Family History  Problem Relation Age of Onset  . Cancer Mother 16       breast cancer, lived to 27,   . Breast cancer Mother 22  . Cancer Son 33       pancreatic cancer  . Heart disease Son        CAD, Tobacco Abuse   . Heart disease Maternal Grandmother 69       died of massive MI  . Stroke Sister     Social History Social History   Tobacco Use  . Smoking status: Never Smoker  . Smokeless tobacco: Never Used  . Tobacco comment: 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 ,  worked at [employer], Health visitor  . Vaping Use: Never used  Substance Use Topics  . Alcohol use: No  . Drug use: No    Review of Systems  Constitutional: No fever/chills.  Positive for generalized weakness. Eyes: No visual changes. ENT: No sore throat. Cardiovascular: Denies chest pain.  Positive for palpitations. Respiratory: Positive for shortness of breath. Gastrointestinal: No abdominal pain.  No nausea, no vomiting.  No diarrhea.  No constipation. Genitourinary: Negative for dysuria. Musculoskeletal: Negative for back pain. Skin: Negative for rash. Neurological: Negative for headaches, focal weakness or numbness.  ____________________________________________   PHYSICAL EXAM:  VITAL SIGNS: ED Triage Vitals [07/12/20 1050]  Enc Vitals Group     BP      Pulse      Resp      Temp      Temp src      SpO2      Weight 105 lb (47.6 kg)     Height 5\' 3"  (1.6 m)  Head Circumference      Peak Flow      Pain Score 0     Pain Loc      Pain Edu?      Excl. in [place]?     Constitutional: Alert and oriented. Eyes: Conjunctivae are normal. Head: Atraumatic. Nose: No congestion/rhinnorhea. Mouth/Throat: Mucous membranes are moist. Neck: Normal ROM Cardiovascular: Tachycardic, irregularly irregular rhythm. Grossly normal heart sounds.  2+ radial pulses bilaterally. Respiratory: Normal respiratory effort.  No retractions. Lungs CTAB. Gastrointestinal: Soft and nontender. No distention. Genitourinary: deferred Musculoskeletal: No lower extremity tenderness nor edema. Neurologic:  Normal speech and language. No gross focal neurologic deficits are appreciated. Skin:  Skin is warm, dry and intact. No rash noted. Psychiatric: Mood and affect are normal. Speech and behavior are normal.  ____________________________________________   LABS (all labs ordered are listed, but only abnormal results are displayed)  Labs Reviewed  BASIC METABOLIC PANEL - Abnormal;  Notable for the following components:      Result Value   Glucose, Bld 117 (*)    Creatinine, Ser 1.15 (*)    GFR, Estimated 46 (*)    All other components within normal limits  BRAIN NATRIURETIC PEPTIDE - Abnormal; Notable for the following components:   B Natriuretic Peptide 2,353.6 (*)    All other components within normal limits  URINALYSIS, COMPLETE (UACMP) WITH MICROSCOPIC - Abnormal; Notable for the following components:   Color, Urine AMBER (*)    APPearance HAZY (*)    Hgb urine dipstick SMALL (*)    Protein, ur 100 (*)    Bacteria, UA RARE (*)    All other components within normal limits  TROPONIN I (HIGH SENSITIVITY) - Abnormal; Notable for the following components:   Troponin I (High Sensitivity) 22 (*)    All other components within normal limits  TROPONIN I (HIGH SENSITIVITY) - Abnormal; Notable for the following components:   Troponin I (High Sensitivity) 23 (*)    All other components within normal limits  RESP PANEL BY RT-PCR (FLU A&B, COVID) ARPGX2  CBC WITH DIFFERENTIAL/PLATELET  MAGNESIUM   ____________________________________________  EKG  ED ECG REPORT I, Blake Divine, the attending physician, personally viewed and interpreted this ECG.   Date: 07/12/2020  EKG Time: 10:57  Rate: 118  Rhythm: atrial fibrillation  Axis: LAD  Intervals:right bundle branch block and left anterior fascicular block  ST&T Change: None   PROCEDURES  Procedure(s) performed (including Critical Care):  Procedures   ____________________________________________   INITIAL IMPRESSION / ASSESSMENT AND PLAN / ED COURSE       87 year old female with past medical history of hypertension, mitral regurgitation, CHF, paroxysmal atrial fibrillation on Xarelto, and CKD who presents to the ED complaining of gradually worsening generalized weakness and dyspnea on exertion over the past 2 weeks.  She is not in any respiratory distress, currently denies difficulty breathing and is  maintaining O2 sats on room air.  She does appear to be in atrial fibrillation with increased rate, maintained in the 110s on my evaluation.  Atrial fibrillation could be contributing to her generalized weakness and shortness of breath, we will attempt to control rate with IV metoprolol.  We will also assess for infectious process with chest x-ray and UA, check blood counts and electrolytes.  Chest x-ray reviewed by me and shows mild cardiomegaly with associated mild pulmonary edema.  Patient continues to breathe comfortably on room air with O2 sats of 100%.  Heart rate improved following IV metoprolol and is  now maintained less than 110.  2 sets of troponin are stable and UA shows no signs of infection.  Patient is appropriate for discharge home with cardiology follow-up in 2 days, will prescribe short course of Lasix along with potassium supplementation.  She recently had her metoprolol dose increased by cardiology and was counseled to continue with this.  She was counseled to return to the ED for new or worsening symptoms, patient agrees with plan.      ____________________________________________   FINAL CLINICAL IMPRESSION(S) / ED DIAGNOSES  Final diagnoses:  Paroxysmal atrial fibrillation (HCC)  Congestive heart failure, unspecified HF chronicity, unspecified heart failure type ([place])  Insomnia, unspecified type     ED Discharge Orders         Ordered    furosemide (LASIX) 20 MG tablet  Daily        07/12/20 1526    potassium chloride SA (KLOR-CON) 20 MEQ tablet  Daily        07/12/20 1526           Note:  This document was prepared using Dragon voice recognition software and may include unintentional dictation errors.   Blake Divine, MD 07/12/20 [phone]

## 2020-07-14 DIAGNOSIS — E782 Mixed hyperlipidemia: Secondary | ICD-10-CM | POA: Diagnosis not present

## 2020-07-14 DIAGNOSIS — K219 Gastro-esophageal reflux disease without esophagitis: Secondary | ICD-10-CM | POA: Diagnosis not present

## 2020-07-14 DIAGNOSIS — R1319 Other dysphagia: Secondary | ICD-10-CM | POA: Diagnosis not present

## 2020-07-14 DIAGNOSIS — I10 Essential (primary) hypertension: Secondary | ICD-10-CM | POA: Diagnosis not present

## 2020-07-14 DIAGNOSIS — R143 Flatulence: Secondary | ICD-10-CM | POA: Diagnosis not present

## 2020-07-14 DIAGNOSIS — K449 Diaphragmatic hernia without obstruction or gangrene: Secondary | ICD-10-CM | POA: Diagnosis not present

## 2020-07-14 DIAGNOSIS — I480 Paroxysmal atrial fibrillation: Secondary | ICD-10-CM | POA: Diagnosis not present

## 2020-07-14 DIAGNOSIS — K2960 Other gastritis without bleeding: Secondary | ICD-10-CM | POA: Diagnosis not present

## 2020-07-17 ENCOUNTER — Telehealth: Payer: Self-pay | Admitting: Internal Medicine

## 2020-07-17 MED ORDER — ALPRAZOLAM 0.25 MG PO TABS: 0.2500 mg | ORAL_TABLET | Freq: Every evening | ORAL | 3 refills | Status: DC | PRN

## 2020-07-17 NOTE — Telephone Encounter (Signed)
Spoke with pt's daughter and she stated that she was not needing a new medication to help the pt sleep, she was wanting to know if the pt could get a refill of the Alprazolam to take when not able to sleep.

## 2020-07-17 NOTE — Telephone Encounter (Signed)
PTs daughter called to advise that PT has been unable to sleep for the past couple of weeks well. PTs daughter wants to know if theres anything that can be prescribed to help PT sleep well. She also stated that the PTs metoprolol tartrate (LOPRESSOR) 25 MG tablet was increased by the Heart Doctor and she was also put on a rhythm medicine.

## 2020-07-17 NOTE — Telephone Encounter (Signed)
She can try 5 mg melatonin taken at dinner, and 500 mg tylenol at bedtime.  Schedule appt to follow up

## 2020-07-17 NOTE — Addendum Note (Signed)
Addended by: [name] on: 07/17/2020 04:39 PM   Modules accepted: Orders

## 2020-07-17 NOTE — Telephone Encounter (Signed)
I will,  But I do not want her to use it every night.  #20/month

## 2020-07-17 NOTE — Telephone Encounter (Signed)
Spoke with daughter to let her know that the Alprazolam was sent in but that Dr. Derrel Nip does not want her to take it every night. I suggested to daughter that pt try the melatonin first. Daughter stated that pt is reluctant on taking the Melatonin because she has heard or read somewhere that it can interfere with blood thinners and cause strokes. I spoke with both Catie and Dr. Derrel Nip about this and they both stated that pt would be fine to take the Melatonin. Called the daughter back to let her know and also to let her know that Dr. Derrel Nip recommends that the pt try the melatonin 5 mg with dinner and then 15 minutes after being in bed if she is not asleep or sleepy to take a 1/2 tablet of Alprazolam. Daughter gave a verbal understanding and stated that she would let the pt know.

## 2020-07-27 DIAGNOSIS — I272 Pulmonary hypertension, unspecified: Secondary | ICD-10-CM | POA: Diagnosis not present

## 2020-07-27 DIAGNOSIS — R001 Bradycardia, unspecified: Secondary | ICD-10-CM | POA: Diagnosis not present

## 2020-07-27 DIAGNOSIS — I5020 Unspecified systolic (congestive) heart failure: Secondary | ICD-10-CM | POA: Diagnosis not present

## 2020-07-27 DIAGNOSIS — I480 Paroxysmal atrial fibrillation: Secondary | ICD-10-CM | POA: Diagnosis not present

## 2020-07-27 DIAGNOSIS — I341 Nonrheumatic mitral (valve) prolapse: Secondary | ICD-10-CM | POA: Diagnosis not present

## 2020-07-27 DIAGNOSIS — I050 Rheumatic mitral stenosis: Secondary | ICD-10-CM | POA: Diagnosis not present

## 2020-07-27 DIAGNOSIS — I10 Essential (primary) hypertension: Secondary | ICD-10-CM | POA: Diagnosis not present

## 2020-07-27 DIAGNOSIS — I700 Atherosclerosis of aorta: Secondary | ICD-10-CM | POA: Diagnosis not present

## 2020-07-27 DIAGNOSIS — I071 Rheumatic tricuspid insufficiency: Secondary | ICD-10-CM | POA: Diagnosis not present

## 2020-08-06 ENCOUNTER — Ambulatory Visit (INDEPENDENT_AMBULATORY_CARE_PROVIDER_SITE_OTHER): Payer: Medicare HMO | Admitting: Internal Medicine

## 2020-08-06 ENCOUNTER — Other Ambulatory Visit: Payer: Self-pay

## 2020-08-06 ENCOUNTER — Encounter: Payer: Self-pay | Admitting: Internal Medicine

## 2020-08-06 DIAGNOSIS — I5020 Unspecified systolic (congestive) heart failure: Secondary | ICD-10-CM

## 2020-08-06 DIAGNOSIS — I480 Paroxysmal atrial fibrillation: Secondary | ICD-10-CM | POA: Diagnosis not present

## 2020-08-06 DIAGNOSIS — D6869 Other thrombophilia: Secondary | ICD-10-CM | POA: Insufficient documentation

## 2020-08-06 DIAGNOSIS — G4700 Insomnia, unspecified: Secondary | ICD-10-CM | POA: Diagnosis not present

## 2020-08-06 DIAGNOSIS — R5382 Chronic fatigue, unspecified: Secondary | ICD-10-CM | POA: Diagnosis not present

## 2020-08-06 DIAGNOSIS — I509 Heart failure, unspecified: Secondary | ICD-10-CM | POA: Diagnosis not present

## 2020-08-06 DIAGNOSIS — K449 Diaphragmatic hernia without obstruction or gangrene: Secondary | ICD-10-CM | POA: Diagnosis not present

## 2020-08-06 DIAGNOSIS — I4891 Unspecified atrial fibrillation: Secondary | ICD-10-CM | POA: Insufficient documentation

## 2020-08-06 DIAGNOSIS — D6859 Other primary thrombophilia: Secondary | ICD-10-CM | POA: Diagnosis not present

## 2020-08-06 NOTE — Assessment & Plan Note (Signed)
Current in bradycardic sinus rhythm s/p cardioversion .  Amiodarone and metoprolol doses addressed,  Lower metoprolol today to 12. 5mg  bid

## 2020-08-06 NOTE — Patient Instructions (Addendum)
Reduce metoprolol dose from 25 mg to 12.5 mg twice daily (cut current tablet in half)   For the insomnia:  I want you to take 3 mg melatonin every night right after dinner.  If you are sleepy when you go to bed,  do NOT take the alprazolam. If you are NOT SLEEPY,  You may take 1/2 alprazolam ,  AND REPEAT THE DOSE later if you wake up   Your breathing problem may be due to your HIATAL HERNIA.    Hiatal Hernia  A hiatal hernia occurs when part of the stomach slides above the muscle that separates the abdomen from the chest (diaphragm). A person can be born with a hiatal hernia (congenital), or it may develop over time. In almost all cases of hiatal hernia, only thetop part of the stomach pushes through the diaphragm. Many people have a hiatal hernia with no symptoms. The larger the hernia, the more likely it is that you will have symptoms. In some cases, a hiatal hernia allows stomach acid to flow back into the tube that carries food from your mouth to your stomach (esophagus). This may cause heartburn symptoms. Severe heartburn symptoms may mean thatyou have developed a condition called gastroesophageal reflux disease (GERD). What are the causes? This condition is caused by a weakness in the opening (hiatus) where the esophagus passes through the diaphragm to attach to the upper part of the stomach. A person may be born with a weakness in the hiatus, or aweakness can develop over time. What increases the risk? This condition is more likely to develop in: Older people. Age is a major risk factor for a hiatal hernia, especially if you are over the age of 80. Pregnant women. People who are overweight. People who have frequent constipation. What are the signs or symptoms? Symptoms of this condition usually develop in the form of GERD symptoms. Symptoms include: Heartburn. Belching. Indigestion. Trouble swallowing. Coughing or wheezing. Sore throat. Hoarseness. Chest pain. Nausea and  vomiting. How is this diagnosed? This condition may be diagnosed during testing for GERD. Tests that may be done include: X-rays of your stomach or chest. An upper gastrointestinal (GI) series. This is an X-ray exam of your GI tract that is taken after you swallow a chalky liquid that shows up clearly on the X-ray. Endoscopy. This is a procedure to look into your stomach using a thin, flexible tube that has a tiny camera and light on the end of it. How is this treated? This condition may be treated by: Dietary and lifestyle changes to help reduce GERD symptoms. Medicines. These may include: Over-the-counter antacids. Medicines that make your stomach empty more quickly. Medicines that block the production of stomach acid (H2 blockers). Stronger medicines to reduce stomach acid (proton pump inhibitors). Surgery to repair the hernia, if other treatments are not helping. If you have no symptoms, you may not need treatment. Follow these instructions at home: Lifestyle and activity Do not use any products that contain nicotine or tobacco, such as cigarettes and e-cigarettes. If you need help quitting, ask your health care provider. Try to achieve and maintain a healthy body weight. Avoid putting pressure on your abdomen. Anything that puts pressure on your abdomen increases the amount of acid that may be pushed up into your esophagus. Avoid bending over, especially after eating. Raise the head of your bed by putting blocks under the legs. This keeps your head and esophagus higher than your stomach. Do not wear tight clothing around your  chest or stomach. Try not to strain when having a bowel movement, when urinating, or when lifting heavy objects. Eating and drinking Avoid foods that can worsen GERD symptoms. These may include: Fatty foods, like fried foods. Citrus fruits, like oranges or lemon. Other foods and drinks that contain acid, like orange juice or tomatoes. Spicy  food. Chocolate. Eat frequent small meals instead of three large meals a day. This helps prevent your stomach from getting too full. Eat slowly. Do not lie down right after eating. Do not eat 1-2 hours before bed. Do not drink beverages with caffeine. These include cola, coffee, cocoa, and tea. Do not drink alcohol. General instructions Take over-the-counter and prescription medicines only as told by your health care provider. Keep all follow-up visits as told by your health care provider. This is important. Contact a health care provider if: Your symptoms are not controlled with medicines or lifestyle changes. You are having trouble swallowing. You have coughing or wheezing that will not go away. Get help right away if: Your pain is getting worse. Your pain spreads to your arms, neck, jaw, teeth, or back. You have shortness of breath. You sweat for no reason. You feel sick to your stomach (nauseous) or you vomit. You vomit blood. You have bright red blood in your stools. You have black, tarry stools. Summary A hiatal hernia occurs when part of the stomach slides above the muscle that separates the abdomen from the chest (diaphragm). A person may be born with a weakness in the hiatus, or a weakness can develop over time. Symptoms of hiatal hernia may include heartburn, trouble swallowing, or sore throat. Management of hiatal hernia includes eating frequent small meals instead of three large meals a day. Get help right away if you vomit blood, have bright red blood in your stools, or have black, tarry stools. This information is not intended to replace advice given to you by your health care provider. Make sure you discuss any questions you have with your healthcare provider. Document Revised: 01/02/2020 Document Reviewed: 01/02/2020 Elsevier Patient Education  2022 [employer].

## 2020-08-06 NOTE — Progress Notes (Signed)
Subjective:  Patient ID: Frances Kent, female    DOB: 06/09/1930  Age: 87 y.o. MRN: [id number]  CC: Diagnoses of PAF (paroxysmal atrial fibrillation) ([place]), Thrombophilia ([place]), Chronic fatigue, Systolic congestive heart failure, unspecified HF chronicity ([place]), Hiatal hernia, and Insomnia, unspecified type were pertinent to this visit.  HPI Frances Kent presents for follow up on chronic insomnia  This visit occurred during the SARS-CoV-2 public health emergency.  Safety protocols were in place, including screening questions prior to the visit, additional usage of staff PPE, and extensive cleaning of exam room while observing appropriate contact time as indicated for disinfecting solutions.   FAtigue Insomnia:  "I can't sleep"  despite taking alprazolam 0.25 mg at bedtime , which is between 10:30 and 11:30.  .  Awake again from 2 am till morning.  Feels Exhausted all the time  . Takes a nap occasionally during the day.  previous trials of melatonin and remeron reviewed   says it didn't help her sleep.    Reports recurrent self limited episodes of "being hard to breathe" but denies chest tightness  and chest pain,  symptom has been occurring daily for the last  several months . Had [place] January .   She had a  chest x ray done in ER in late May ; Mild chf noted.  Treated with diuretics for 5 days .  Breathing did not improve    Esophageal stricture stretched recently,  still has to be careful to chew food   Atrial fibrillation: converted to sinus rhythm , maintained with metoprolol and amiodarone .  Pulse is 43 . Was seen by cardiology PA in early June and pulse was also low.  Amiodarone dose was reduced to 200 mg daily and metoprolol continued at 25 mg bid BP was 140/80 and was considered "elevated"  (given patient's age and bradycardia, no changes were made) .  Follow up in 2 weeks was advised  ": 1. Paroxysmal atrial fibrillation: Patient has converted to normal sinus rhythm with  bradycardia at 42 bpm. We will decrease her amiodarone to 200 mg once daily and continue metoprolol at 25 mg twice daily. Patient should continue her Xarelto for stroke risk reduction, she is not currently having any adverse effects including bleeding and/or bruising. We will see the patient back in approximately 1 to 2 weeks to evaluate heart rate and rhythm by ECG and determine the need for further medication adjustments. If rate continues to be bradycardic we can consider reducing her metoprolol dose.     Outpatient Medications Prior to Visit  Medication Sig Dispense Refill   ALPRAZolam (XANAX) 0.25 MG tablet Take 1 tablet (0.25 mg total) by mouth at bedtime as needed for anxiety or sleep. 20 tablet 3   Bevacizumab (AVASTIN IV) 25 mg/mL by Intravitreal route. Every nine weeks     EUTHYROX 50 MCG tablet TAKE 1 TABLET BY MOUTH ONCE DAILY 30  MINUTES  PRIOR  TO  EATING 90 tablet 0   metoprolol tartrate (LOPRESSOR) 25 MG tablet Take 25 mg by mouth 2 (two) times daily.     pantoprazole (PROTONIX) 20 MG tablet Take 20 mg by mouth daily.     XARELTO 15 MG TABS tablet TAKE 1 TABLET EVERY DAY 30 tablet 0   amiodarone (PACERONE) 200 MG tablet Take 200 mg by mouth 2 (two) times daily. (Patient not taking: Reported on 08/06/2020)     furosemide (LASIX) 20 MG tablet Take 1 tablet (20 mg total) by  mouth daily for 5 days. 5 tablet 0   potassium chloride SA (KLOR-CON) 20 MEQ tablet Take 1 tablet (20 mEq total) by mouth daily for 5 days. 5 tablet 0   No facility-administered medications prior to visit.    Review of Systems;  Patient denies headache, fevers, malaise, unintentional weight loss, skin rash, eye pain, sinus congestion and sinus pain, sore throat, dysphagia,  hemoptysis , cough, dyspnea, wheezing, chest pain, palpitations, orthopnea, edema, abdominal pain, nausea, melena, diarrhea, constipation, flank pain, dysuria, hematuria, urinary  Frequency, nocturia, numbness, tingling, seizures,  Focal  weakness, Loss of consciousness,  Tremor, insomnia, depression, anxiety, and suicidal ideation.      Objective:  BP (!) 158/68 (BP Location: Left Arm, Patient Position: Sitting, Cuff Size: Normal)   Pulse (!) 43   Temp (!) 95.8 F (35.4 C) (Temporal)   Resp 15   Ht 5\' 3"  (1.6 m)   Wt 106 lb 6.4 oz (48.3 kg)   SpO2 95%   BMI 18.85 kg/m   BP Readings from Last 3 Encounters:  08/06/20 (!) 158/68  07/12/20 133/89  06/08/20 136/66    Wt Readings from Last 3 Encounters:  08/06/20 106 lb 6.4 oz (48.3 kg)  07/12/20 105 lb (47.6 kg)  06/08/20 109 lb 6.4 oz (49.6 kg)    General appearance: alert, cooperative and appears stated age Ears: normal TM's and external ear canals both ears Throat: lips, mucosa, and tongue normal; teeth and gums normal Neck: no adenopathy, no carotid bruit, supple, symmetrical, trachea midline and thyroid not enlarged, symmetric, no tenderness/mass/nodules Back: symmetric, no curvature. ROM normal. No CVA tenderness. Lungs: clear to auscultation bilaterally Heart: regular rate and rhythm, S1, S2 normal, no murmur, click, rub or gallop Abdomen: soft, non-tender; bowel sounds normal; no masses,  no organomegaly Pulses: 2+ and symmetric Skin: Skin color, texture, turgor normal. No rashes or lesions Lymph nodes: Cervical, supraclavicular, and axillary nodes normal.  No results found for: HGBA1C  Lab Results  Component Value Date   CREATININE 1.15 (H) 07/12/2020   CREATININE 1.04 06/08/2020   CREATININE 1.19 (H) 04/02/2020    Lab Results  Component Value Date   WBC 6.7 07/12/2020   HGB 12.3 07/12/2020   HCT 37.7 07/12/2020   PLT 277 07/12/2020   GLUCOSE 117 (H) 07/12/2020   CHOL 284 (H) 12/11/2019   TRIG 91.0 12/11/2019   HDL 100.10 12/11/2019   LDLDIRECT 185.3 03/27/2013   LDLCALC 166 (H) 12/11/2019   ALT 30 06/08/2020   AST 29 06/08/2020   NA 140 07/12/2020   K 4.1 07/12/2020   CL 104 07/12/2020   CREATININE 1.15 (H) 07/12/2020   BUN 23  07/12/2020   CO2 23 07/12/2020   TSH 1.16 06/08/2020   INR 1.03 03/04/2016    DG Chest Portable 1 View  Result Date: 07/12/2020 CLINICAL DATA:  Patient states she has been very weak for the past week along with feeling short of breath. Patient able to stand and walk with assistance. EXAM: PORTABLE CHEST 1 VIEW COMPARISON:  04/02/2020 and older studies. FINDINGS: Cardiac silhouette is mildly enlarged. No mediastinal or hilar masses. There is bilateral interstitial thickening most evident in the lower lungs. No lung consolidation to suggest pneumonia. Probable small effusions. No pneumothorax. Skeletal structures are grossly intact. IMPRESSION: 1. Cardiomegaly with interstitial thickening and probable small effusions. Appearance is similar to the study from 04/02/2020. Suspect mild congestive heart failure. No evidence of pneumonia. Electronically Signed   By: Dedra Skeens.D.  On: 07/12/2020 12:30    Assessment & Plan:   Problem List Items Addressed This Visit       Unprioritized   CHF (congestive heart failure) ([place])    Presumed non ischemic by 2020 myoview, with mild LVH , puilmonary hypertension and mild mitral valve regurgitation. She has regular follow up with her cardiologist Dr [name], and per his April 2022 office note,  There is no reversible cause for her symptoms of dyspnea:  "No further intervention shortness of breath reported above multifactorial in nature including age, decreased exercise tolerance, disability, and/or medication management. The patient is to follow for any worsening symptoms or increase in severity for further in need in investigation or treatment options.  -Furosemide for lower extremity edema with continued discussion of other home treatment options including the DASH diet and or compression hose -Continue current medical regimen for hypertension control which is stable at this time and without apparent significant side effects or symptoms of medications.  Further treatment goals of low sodium diet for additive effects of these medications have been discussed today as well.  -No change in current and appropriate medication management of atrial fibrillation and coexisting risk factors"  I referred her to  Cardiopulmonary rehab in April ; it is unclear why she has not gone       Fatigue    Multifactorial due to chronic insomnia and medication induced  bradycardia.  Reducing metoprolol to 12.5 mg bid and follow up with cardiology as planned.  regimen  for insomnia reviewed and amended        Hiatal hernia    DISCUSSED with patient; may be contributing to her recurrent difficulty breathing (if occurring post prandially)       Insomnia    Advised to repeat a trial of melatonin 3 mg at dinnertime AND reduce alprazolam dose to 0.125 mg AS NEEDED.        PAF (paroxysmal atrial fibrillation) (HCC)    Current in bradycardic sinus rhythm s/p cardioversion .  Amiodarone and metoprolol doses addressed,  Lower metoprolol today to 12. 5mg  bid       Thrombophilia ([place])    PATIENT TAKING ARELTO FOR MANAGEMENT OF EMBOLIC RISK DUE TO PAF        I am having Kryssa A. Frickey maintain her pantoprazole, Bevacizumab (AVASTIN IV), amiodarone, metoprolol tartrate, Xarelto, Euthyrox, furosemide, potassium chloride SA, and ALPRAZolam.  No orders of the defined types were placed in this encounter.   There are no discontinued medications.  Follow-up: Return in about 3 weeks (around 08/27/2020).   [name], MD

## 2020-08-06 NOTE — Assessment & Plan Note (Signed)
PATIENT TAKING ARELTO FOR MANAGEMENT OF EMBOLIC RISK DUE TO PAF

## 2020-08-09 DIAGNOSIS — K449 Diaphragmatic hernia without obstruction or gangrene: Secondary | ICD-10-CM | POA: Insufficient documentation

## 2020-08-09 MED ORDER — METOPROLOL TARTRATE 25 MG PO TABS: 12.5000 mg | ORAL_TABLET | Freq: Two times a day (BID) | ORAL | 1 refills | Status: DC

## 2020-08-09 NOTE — Assessment & Plan Note (Signed)
DISCUSSED with patient; may be contributing to her recurrent difficulty breathing (if occurring post prandially)

## 2020-08-09 NOTE — Assessment & Plan Note (Addendum)
Multifactorial due to chronic insomnia and medication induced  bradycardia.  Reducing metoprolol to 12.5 mg bid and follow up with cardiology as planned.  regimen  for insomnia reviewed and amended

## 2020-08-09 NOTE — Assessment & Plan Note (Signed)
Presumed non ischemic by 2020 myoview, with mild LVH , puilmonary hypertension and mild mitral valve regurgitation. She has regular follow up with her cardiologist Dr [name], and per his April 2022 office note,  There is no reversible cause for her symptoms of dyspnea:  "No further intervention shortness of breath reported above multifactorial in nature including age, decreased exercise tolerance, disability, and/or medication management. The patient is to follow for any worsening symptoms or increase in severity for further in need in investigation or treatment options.  -Furosemide for lower extremity edema with continued discussion of other home treatment options including the DASH diet and or compression hose -Continue current medical regimen for hypertension control which is stable at this time and without apparent significant side effects or symptoms of medications. Further treatment goals of low sodium diet for additive effects of these medications have been discussed today as well.  -No change in current and appropriate medication management of atrial fibrillation and coexisting risk factors"  I referred her to  Cardiopulmonary rehab in April ; it is unclear why she has not gone

## 2020-08-09 NOTE — Assessment & Plan Note (Signed)
Advised to repeat a trial of melatonin 3 mg at dinnertime AND reduce alprazolam dose to 0.125 mg AS NEEDED.

## 2020-08-12 DIAGNOSIS — I480 Paroxysmal atrial fibrillation: Secondary | ICD-10-CM | POA: Diagnosis not present

## 2020-08-12 DIAGNOSIS — I10 Essential (primary) hypertension: Secondary | ICD-10-CM | POA: Diagnosis not present

## 2020-08-12 DIAGNOSIS — R001 Bradycardia, unspecified: Secondary | ICD-10-CM | POA: Diagnosis not present

## 2020-08-13 ENCOUNTER — Other Ambulatory Visit: Payer: Self-pay | Admitting: Internal Medicine

## 2020-08-25 DIAGNOSIS — H353212 Exudative age-related macular degeneration, right eye, with inactive choroidal neovascularization: Secondary | ICD-10-CM | POA: Diagnosis not present

## 2020-08-25 DIAGNOSIS — H18003 Unspecified corneal deposit, bilateral: Secondary | ICD-10-CM | POA: Diagnosis not present

## 2020-08-26 ENCOUNTER — Encounter: Payer: Self-pay | Admitting: Internal Medicine

## 2020-08-26 ENCOUNTER — Other Ambulatory Visit: Payer: Self-pay

## 2020-08-26 ENCOUNTER — Ambulatory Visit (INDEPENDENT_AMBULATORY_CARE_PROVIDER_SITE_OTHER): Payer: Medicare HMO | Admitting: Internal Medicine

## 2020-08-26 VITALS — BP 178/73 | HR 72 | Temp 95.5°F | Resp 15 | Ht 63.0 in | Wt 104.8 lb

## 2020-08-26 DIAGNOSIS — Z8616 Personal history of COVID-19: Secondary | ICD-10-CM

## 2020-08-26 DIAGNOSIS — R001 Bradycardia, unspecified: Secondary | ICD-10-CM

## 2020-08-26 DIAGNOSIS — J9601 Acute respiratory failure with hypoxia: Secondary | ICD-10-CM | POA: Diagnosis not present

## 2020-08-26 DIAGNOSIS — E538 Deficiency of other specified B group vitamins: Secondary | ICD-10-CM

## 2020-08-26 DIAGNOSIS — I10 Essential (primary) hypertension: Secondary | ICD-10-CM | POA: Diagnosis not present

## 2020-08-26 DIAGNOSIS — D692 Other nonthrombocytopenic purpura: Secondary | ICD-10-CM

## 2020-08-26 DIAGNOSIS — I700 Atherosclerosis of aorta: Secondary | ICD-10-CM

## 2020-08-26 MED ORDER — ALPRAZOLAM 0.25 MG PO TABS: 0.2500 mg | ORAL_TABLET | Freq: Every evening | ORAL | 3 refills | Status: DC | PRN

## 2020-08-26 NOTE — Progress Notes (Signed)
Subjective:  Patient ID: Frances Kent, female    DOB: 07-06-1930  Age: 87 y.o. MRN: [id number]  CC: The primary encounter diagnosis was Acute respiratory failure with hypoxia ([place]). Diagnoses of Abdominal aortic atherosclerosis ([place]), B12 deficiency, Purpura senilis ([place]), History of COVID-19, Bradycardia, and Essential hypertension were also pertinent to this visit.  HPI Frances Kent presents for  one month follow up on chronic fatigue and insomnia  This visit occurred during the SARS-CoV-2 public health emergency.  Safety protocols were in place, including screening questions prior to the visit, additional usage of staff PPE, and extensive cleaning of exam room while observing appropriate contact time as indicated for disinfecting solutions.   Bradycardia: her metoprolol dose was reduced by me to 12.5 mg at last visit one month ago, and ultimately discontinued by Dr [name] one week later. Her fatigue has improved somewhat  she reports that "I have good days and bad days''  but admits to having some "real good days " since then. Pulse has been in the 60's to low 70's by home checks.  Does not check blood pressure   Insomnia:  she  tried adding melatonin but felt worse the following morning and still needed to use the low dose of  alprazolam.    Underweight:  her weight is stable, but she has maintained the unintentional weight loss of last year of nearly 20 lbs.  She continues to have periodic swallowing issues if she eats too quickly.  Hiatal hernia reviewed,.    Outpatient Medications Prior to Visit  Medication Sig Dispense Refill   amiodarone (PACERONE) 200 MG tablet Take 200 mg by mouth 2 (two) times daily.     Bevacizumab (AVASTIN IV) 25 mg/mL by Intravitreal route. Every nine weeks     EUTHYROX 50 MCG tablet TAKE 1 TABLET BY MOUTH ONCE DAILY 30  MINUTES  PRIOR  TO  EATING 90 tablet 0   XARELTO 15 MG TABS tablet TAKE 1 TABLET EVERY DAY 60 tablet 1   ALPRAZolam (XANAX) 0.25 MG  tablet Take 1 tablet (0.25 mg total) by mouth at bedtime as needed for anxiety or sleep. (Patient not taking: Reported on 08/26/2020) 20 tablet 3   furosemide (LASIX) 20 MG tablet Take 1 tablet (20 mg total) by mouth daily for 5 days. 5 tablet 0   metoprolol tartrate (LOPRESSOR) 25 MG tablet Take 0.5 tablets (12.5 mg total) by mouth 2 (two) times daily. (Patient not taking: Reported on 08/26/2020) 90 tablet 1   pantoprazole (PROTONIX) 20 MG tablet Take 20 mg by mouth daily. (Patient not taking: Reported on 08/26/2020)     potassium chloride SA (KLOR-CON) 20 MEQ tablet Take 1 tablet (20 mEq total) by mouth daily for 5 days. 5 tablet 0   No facility-administered medications prior to visit.    Review of Systems;  Patient denies headache, fevers, malaise, unintentional weight loss, skin rash, eye pain, sinus congestion and sinus pain, sore throat, dysphagia,  hemoptysis , cough, dyspnea, wheezing, chest pain, palpitations, orthopnea, edema, abdominal pain, nausea, melena, diarrhea, constipation, flank pain, dysuria, hematuria, urinary  Frequency, nocturia, numbness, tingling, seizures,  Focal weakness, Loss of consciousness,  Tremor, depression, anxiety, and suicidal ideation.      Objective:  BP (!) 178/73 (BP Location: Left Arm, Patient Position: Sitting, Cuff Size: Normal)   Pulse 72   Temp (!) 95.5 F (35.3 C) (Temporal)   Resp 15   Ht 5\' 3"  (1.6 m)   Wt 104 lb 12.8  oz (47.5 kg)   SpO2 97%   BMI 18.56 kg/m   BP Readings from Last 3 Encounters:  08/26/20 (!) 178/73  08/06/20 (!) 158/68  07/12/20 133/89    Wt Readings from Last 3 Encounters:  08/26/20 104 lb 12.8 oz (47.5 kg)  08/06/20 106 lb 6.4 oz (48.3 kg)  07/12/20 105 lb (47.6 kg)    General appearance: alert, cooperative and appears stated age Ears: normal TM's and external ear canals both ears Throat: lips, mucosa, and tongue normal; teeth and gums normal Neck: no adenopathy, no carotid bruit, supple, symmetrical, trachea  midline and thyroid not enlarged, symmetric, no tenderness/mass/nodules Back: symmetric, no curvature. ROM normal. No CVA tenderness. Lungs: clear to auscultation bilaterally Heart: regular rate and rhythm, S1, S2 normal, no murmur, click, rub or gallop Abdomen: soft, non-tender; bowel sounds normal; no masses,  no organomegaly Pulses: 2+ and symmetric Skin: scattered purpura ,  turgor poor. No rashes or lesions Lymph nodes: Cervical, supraclavicular, and axillary nodes normal.  No results found for: HGBA1C  Lab Results  Component Value Date   CREATININE 1.15 (H) 07/12/2020   CREATININE 1.04 06/08/2020   CREATININE 1.19 (H) 04/02/2020    Lab Results  Component Value Date   WBC 6.7 07/12/2020   HGB 12.3 07/12/2020   HCT 37.7 07/12/2020   PLT 277 07/12/2020   GLUCOSE 117 (H) 07/12/2020   CHOL 284 (H) 12/11/2019   TRIG 91.0 12/11/2019   HDL 100.10 12/11/2019   LDLDIRECT 185.3 03/27/2013   LDLCALC 166 (H) 12/11/2019   ALT 30 06/08/2020   AST 29 06/08/2020   NA 140 07/12/2020   K 4.1 07/12/2020   CL 104 07/12/2020   CREATININE 1.15 (H) 07/12/2020   BUN 23 07/12/2020   CO2 23 07/12/2020   TSH 1.16 06/08/2020   INR 1.03 03/04/2016    DG Chest Portable 1 View  Result Date: 07/12/2020 CLINICAL DATA:  Patient states she has been very weak for the past week along with feeling short of breath. Patient able to stand and walk with assistance. EXAM: PORTABLE CHEST 1 VIEW COMPARISON:  04/02/2020 and older studies. FINDINGS: Cardiac silhouette is mildly enlarged. No mediastinal or hilar masses. There is bilateral interstitial thickening most evident in the lower lungs. No lung consolidation to suggest pneumonia. Probable small effusions. No pneumothorax. Skeletal structures are grossly intact. IMPRESSION: 1. Cardiomegaly with interstitial thickening and probable small effusions. Appearance is similar to the study from 04/02/2020. Suspect mild congestive heart failure. No evidence of  pneumonia. Electronically Signed   By: Lajean Manes M.D.   On: 07/12/2020 12:30    Assessment & Plan:   Problem List Items Addressed This Visit       Unprioritized   Abdominal aortic atherosclerosis ([place])    Reviewed findings of prior CT scan today..  Patient remains unwilling to  Initiate statin therpay given her past intolerance due to severe myalgias.        RESOLVED: Acute respiratory failure with hypoxia (HCC) - Primary   B12 deficiency    Intrinsic Factor antibody negative. Continue oral supplementation   Lab Results  Component Value Date   VITAMINB12 810 06/08/2020         Bradycardia    Improved with discontinuation of metoprolol.  Rate is 72 today and she is reporting less fatigue.        Essential hypertension    She does not check readings at home, today's reading is elevated without metoprolol which was stopped due  to bradycardia. She has been asked to check her pressures at home and submit readings for evaluation. Renal function is mildly reduced . Would benefit from an ARB if tolerated   Lab Results  Component Value Date   CREATININE 1.15 (H) 07/12/2020   Lab Results  Component Value Date   NA 140 07/12/2020   K 4.1 07/12/2020   CL 104 07/12/2020   CO2 23 07/12/2020         History of COVID-19    Resulting in prolonged fatigue and weakness slow recovery in progress.        Purpura senilis ([place])    Secondary to use of xarelto for mitigation of embolic stroke risk.         I spent 30 mintutes dedicated to the care of this patient on the date of this encounter to include pre-visit review of his medical history,  Face-to-face time with the patient , and post visit ordering of testing and therapeutics.   I have discontinued Frances Kent's pantoprazole, furosemide, potassium chloride SA, and metoprolol tartrate. I am also having her maintain her Bevacizumab (AVASTIN IV), amiodarone, Euthyrox, Xarelto, and ALPRAZolam.  Meds ordered this  encounter  Medications   ALPRAZolam (XANAX) 0.25 MG tablet    Sig: Take 1 tablet (0.25 mg total) by mouth at bedtime as needed for anxiety or sleep.    Dispense:  20 tablet    Refill:  3    Maximum 20/month    Medications Discontinued During This Encounter  Medication Reason   furosemide (LASIX) 20 MG tablet    potassium chloride SA (KLOR-CON) 20 MEQ tablet    metoprolol tartrate (LOPRESSOR) 25 MG tablet    pantoprazole (PROTONIX) 20 MG tablet    ALPRAZolam (XANAX) 0.25 MG tablet Reorder    Follow-up: Return in about 3 months (around 11/26/2020).   [name], MD

## 2020-08-26 NOTE — Patient Instructions (Addendum)
For your insomnia:  Take the  melatonin at dinnertime. And the full tablet  of alprazolam at bedtime   Start checking your blood pressure once daily and send me readings after one week   Eat slowly.  Smaller meals more frequently is better tolerated when you have a hiatal hernia  KEEP your skin MOISTURIZED.  Consider a "Hair, Skin and Nails" supplement .. if the pills are too large,  just add biotin

## 2020-08-28 ENCOUNTER — Encounter: Payer: Self-pay | Admitting: Internal Medicine

## 2020-08-28 DIAGNOSIS — J9601 Acute respiratory failure with hypoxia: Secondary | ICD-10-CM | POA: Insufficient documentation

## 2020-08-28 DIAGNOSIS — D692 Other nonthrombocytopenic purpura: Secondary | ICD-10-CM | POA: Insufficient documentation

## 2020-08-28 NOTE — Assessment & Plan Note (Signed)
She does not check readings at home, today's reading is elevated without metoprolol which was stopped due to bradycardia. She has been asked to check her pressures at home and submit readings for evaluation. Renal function is mildly reduced . Would benefit from an ARB if tolerated   Lab Results  Component Value Date   CREATININE 1.15 (H) 07/12/2020   Lab Results  Component Value Date   NA 140 07/12/2020   K 4.1 07/12/2020   CL 104 07/12/2020   CO2 23 07/12/2020

## 2020-08-28 NOTE — Assessment & Plan Note (Signed)
Resulting in prolonged fatigue and weakness slow recovery in progress.

## 2020-08-28 NOTE — Assessment & Plan Note (Signed)
Improved with discontinuation of metoprolol.  Rate is 72 today and she is reporting less fatigue.

## 2020-08-28 NOTE — Assessment & Plan Note (Signed)
Intrinsic Factor antibody negative. Continue oral supplementation   Lab Results  Component Value Date   VITAMINB12 810 06/08/2020

## 2020-08-28 NOTE — Assessment & Plan Note (Signed)
Secondary to use of xarelto for mitigation of embolic stroke risk.

## 2020-08-28 NOTE — Assessment & Plan Note (Signed)
Reviewed findings of prior CT scan today..  Patient remains unwilling to  Initiate statin therpay given her past intolerance due to severe myalgias.

## 2020-09-01 DIAGNOSIS — H353221 Exudative age-related macular degeneration, left eye, with active choroidal neovascularization: Secondary | ICD-10-CM | POA: Diagnosis not present

## 2020-09-07 ENCOUNTER — Other Ambulatory Visit: Payer: Self-pay

## 2020-09-07 ENCOUNTER — Emergency Department: Payer: Medicare HMO

## 2020-09-07 ENCOUNTER — Inpatient Hospital Stay
Admission: EM | Admit: 2020-09-07 | Discharge: 2020-09-09 | DRG: 309 | Disposition: A | Discharge status: Home / Self Care | Payer: Medicare HMO | Attending: Internal Medicine | Admitting: Internal Medicine

## 2020-09-07 ENCOUNTER — Encounter: Payer: Self-pay | Admitting: *Deleted

## 2020-09-07 DIAGNOSIS — I4891 Unspecified atrial fibrillation: Secondary | ICD-10-CM

## 2020-09-07 DIAGNOSIS — H3530 Unspecified macular degeneration: Secondary | ICD-10-CM | POA: Diagnosis present

## 2020-09-07 DIAGNOSIS — I444 Left anterior fascicular block: Secondary | ICD-10-CM | POA: Diagnosis present

## 2020-09-07 DIAGNOSIS — R000 Tachycardia, unspecified: Secondary | ICD-10-CM | POA: Diagnosis not present

## 2020-09-07 DIAGNOSIS — R55 Syncope and collapse: Secondary | ICD-10-CM

## 2020-09-07 DIAGNOSIS — I471 Supraventricular tachycardia: Secondary | ICD-10-CM | POA: Diagnosis present

## 2020-09-07 DIAGNOSIS — E860 Dehydration: Secondary | ICD-10-CM | POA: Diagnosis not present

## 2020-09-07 DIAGNOSIS — Z86711 Personal history of pulmonary embolism: Secondary | ICD-10-CM

## 2020-09-07 DIAGNOSIS — R531 Weakness: Secondary | ICD-10-CM

## 2020-09-07 DIAGNOSIS — K219 Gastro-esophageal reflux disease without esophagitis: Secondary | ICD-10-CM | POA: Diagnosis present

## 2020-09-07 DIAGNOSIS — F419 Anxiety disorder, unspecified: Secondary | ICD-10-CM | POA: Diagnosis present

## 2020-09-07 DIAGNOSIS — Z7901 Long term (current) use of anticoagulants: Secondary | ICD-10-CM

## 2020-09-07 DIAGNOSIS — Z803 Family history of malignant neoplasm of breast: Secondary | ICD-10-CM | POA: Diagnosis not present

## 2020-09-07 DIAGNOSIS — I5022 Chronic systolic (congestive) heart failure: Secondary | ICD-10-CM | POA: Diagnosis not present

## 2020-09-07 DIAGNOSIS — N1831 Chronic kidney disease, stage 3a: Secondary | ICD-10-CM | POA: Diagnosis not present

## 2020-09-07 DIAGNOSIS — I130 Hypertensive heart and chronic kidney disease with heart failure and stage 1 through stage 4 chronic kidney disease, or unspecified chronic kidney disease: Secondary | ICD-10-CM | POA: Diagnosis not present

## 2020-09-07 DIAGNOSIS — I272 Pulmonary hypertension, unspecified: Secondary | ICD-10-CM | POA: Diagnosis not present

## 2020-09-07 DIAGNOSIS — Z888 Allergy status to other drugs, medicaments and biological substances status: Secondary | ICD-10-CM | POA: Diagnosis not present

## 2020-09-07 DIAGNOSIS — Z800 Family history of malignant neoplasm of digestive organs: Secondary | ICD-10-CM

## 2020-09-07 DIAGNOSIS — Z823 Family history of stroke: Secondary | ICD-10-CM

## 2020-09-07 DIAGNOSIS — R110 Nausea: Secondary | ICD-10-CM | POA: Diagnosis present

## 2020-09-07 DIAGNOSIS — I495 Sick sinus syndrome: Secondary | ICD-10-CM

## 2020-09-07 DIAGNOSIS — Z20822 Contact with and (suspected) exposure to covid-19: Secondary | ICD-10-CM | POA: Diagnosis present

## 2020-09-07 DIAGNOSIS — N179 Acute kidney failure, unspecified: Secondary | ICD-10-CM

## 2020-09-07 DIAGNOSIS — R7989 Other specified abnormal findings of blood chemistry: Secondary | ICD-10-CM

## 2020-09-07 DIAGNOSIS — E785 Hyperlipidemia, unspecified: Secondary | ICD-10-CM | POA: Diagnosis present

## 2020-09-07 DIAGNOSIS — I517 Cardiomegaly: Secondary | ICD-10-CM | POA: Diagnosis not present

## 2020-09-07 DIAGNOSIS — I480 Paroxysmal atrial fibrillation: Secondary | ICD-10-CM | POA: Diagnosis not present

## 2020-09-07 DIAGNOSIS — R002 Palpitations: Secondary | ICD-10-CM | POA: Diagnosis present

## 2020-09-07 DIAGNOSIS — N1832 Chronic kidney disease, stage 3b: Secondary | ICD-10-CM | POA: Diagnosis present

## 2020-09-07 DIAGNOSIS — I700 Atherosclerosis of aorta: Secondary | ICD-10-CM | POA: Diagnosis not present

## 2020-09-07 DIAGNOSIS — Z8249 Family history of ischemic heart disease and other diseases of the circulatory system: Secondary | ICD-10-CM | POA: Diagnosis not present

## 2020-09-07 DIAGNOSIS — E039 Hypothyroidism, unspecified: Secondary | ICD-10-CM | POA: Diagnosis not present

## 2020-09-07 DIAGNOSIS — Z79899 Other long term (current) drug therapy: Secondary | ICD-10-CM | POA: Diagnosis not present

## 2020-09-07 LAB — CBC
HCT: 39.6 % (ref 36.0–46.0)
Hemoglobin: 12.5 g/dL (ref 12.0–15.0)
MCH: 28.3 pg (ref 26.0–34.0)
MCHC: 31.6 g/dL (ref 30.0–36.0)
MCV: 89.8 fL (ref 80.0–100.0)
Platelets: 288 10*3/uL (ref 150–400)
RBC: 4.41 MIL/uL (ref 3.87–5.11)
RDW: 17.9 % — ABNORMAL HIGH (ref 11.5–15.5)
WBC: 6.3 10*3/uL (ref 4.0–10.5)
nRBC: 0 % (ref 0.0–0.2)

## 2020-09-07 LAB — BASIC METABOLIC PANEL
Anion gap: 10 (ref 5–15)
BUN: 13 mg/dL (ref 8–23)
CO2: 24 mmol/L (ref 22–32)
Calcium: 9.1 mg/dL (ref 8.9–10.3)
Chloride: 104 mmol/L (ref 98–111)
Creatinine, Ser: 1.32 mg/dL — ABNORMAL HIGH (ref 0.44–1.00)
GFR, Estimated: 39 mL/min — ABNORMAL LOW (ref >60)
Glucose, Bld: 130 mg/dL — ABNORMAL HIGH (ref 70–99)
Potassium: 4.7 mmol/L (ref 3.5–5.1)
Sodium: 138 mmol/L (ref 135–145)

## 2020-09-07 LAB — TROPONIN I (HIGH SENSITIVITY)
Troponin I (High Sensitivity): 21 ng/L — ABNORMAL HIGH (ref <18)
Troponin I (High Sensitivity): 18 ng/L — ABNORMAL HIGH (ref <18)

## 2020-09-07 MED ORDER — METOPROLOL TARTRATE 25 MG PO TABS
12.5000 mg | ORAL_TABLET | Freq: Once | ORAL | Status: AC
  Administered 2020-09-08: 12.5 mg via ORAL
  Filled 2020-09-07: qty 1

## 2020-09-07 MED ORDER — SODIUM CHLORIDE 0.9 % IV BOLUS
500.0000 mL | Freq: Once | INTRAVENOUS | Status: AC
  Administered 2020-09-07: 500 mL via INTRAVENOUS

## 2020-09-07 MED ORDER — DILTIAZEM HCL 25 MG/5ML IV SOLN: 15.0000 mg | Freq: Once | INTRAVENOUS | Status: AC

## 2020-09-07 MED ORDER — DILTIAZEM HCL 25 MG/5ML IV SOLN
INTRAVENOUS | Status: AC
  Administered 2020-09-07: 15 mg via INTRAVENOUS
  Filled 2020-09-07: qty 5

## 2020-09-07 NOTE — ED Notes (Signed)
ED Provider at bedside. 

## 2020-09-07 NOTE — Discharge Instructions (Addendum)
Please seek medical attention for any high fevers, chest pain, shortness of breath, change in behavior, persistent vomiting, bloody stool or any other new or concerning symptoms.  

## 2020-09-07 NOTE — ED Notes (Signed)
Patient reports weakness, and palpitations at home. Patient reports recent change in cardiac medications. Patient reports hx of afib. Patient denies chest pain, or SOB, but reports some nausea.

## 2020-09-07 NOTE — ED Provider Notes (Signed)
[facility] Emergency Department Provider Note   ____________________________________________   I have reviewed the triage vital signs and the nursing notes.   HISTORY  Chief Complaint Tachycardia and Weakness   History limited by: Not Limited   HPI Frances Kent is a 87 y.o. female who presents to the emergency department today because of concerns for weakness and fast heart rate.  Patient states that she has history of atrial fibrillation.  She states that she is had episodes where she will feel weak and fatigued.  States that she will have a hard time getting up.  She does have a sense that her heart is beating fast although she denies any chest pain.  She denies any shortness of breath.  No recent illness.   Records reviewed. Per medical record review patient has a history of atrial fibrillation, SVT.  Past Medical History:  Diagnosis Date   Atrial fibrillation ([place])    Benign breast cyst in female, left 10/07/2016   Colon adenomas    GERD (gastroesophageal reflux disease)    Hypertension    Hypothyroidism    Macular degeneration    Mitral regurgitation    Pulmonary embolism ([place]) 02/2016   SVT (supraventricular tachycardia) [facility])     Patient Active Problem List   Diagnosis Date Noted   Purpura senilis ([place]) 08/28/2020   Hiatal hernia 08/09/2020   PAF (paroxysmal atrial fibrillation) ([place]) 08/06/2020   Thrombophilia ([place]) 08/06/2020   History of COVID-19 03/03/2020   Schatzki's ring of distal esophagus 12/11/2019   Erosive gastritis 11/30/2019   Dysphagia 11/27/2019   Painful swallowing 11/27/2019   Gastroesophageal reflux disease 11/27/2019   Stage 3a chronic kidney disease ([place]) 04/11/2019   Coagulopathy ([place]) 09/12/2018   Essential hypertension 09/12/2018   CHF (congestive heart failure) ([place]) 08/15/2018   Hypothyroidism due to acquired atrophy of thyroid 05/08/2018   Unintentional weight loss 05/08/2018   Myalgia due to statin  05/08/2018   Abdominal aortic atherosclerosis ([place]) 05/07/2018   Bradycardia 04/28/2018   Pleural effusion on left 02/27/2018   Moderate mitral stenosis 01/10/2018   Lumbar radiculitis 01/07/2018   B12 deficiency 07/26/2017   Atrial fibrillation status post cardioversion ([place]) 04/06/2016   Hospital discharge follow-up 03/10/2016   Vitamin D deficiency 04/08/2015   Cervical spine degeneration 12/13/2014   History of pulmonary embolism 12/02/2014   Insomnia 10/04/2014   Moderate tricuspid insufficiency 09/17/2014   Generalized anxiety disorder 03/28/2013   Mitral valve prolapse 03/28/2013   Routine adult health maintenance 03/23/2012   Fatigue 03/23/2012   Screening for breast cancer 11/29/2010   Macular degeneration, left eye 11/29/2010   Screening for colon cancer 11/29/2010   Hyperlipidemia LDL goal <160 11/29/2010   GERD (gastroesophageal reflux disease)     Past Surgical History:  Procedure Laterality Date   APPENDECTOMY  1960   BREAST CYST ASPIRATION Left 2017   CARDIOVERSION N/A 04/05/2016   Procedure: Cardioversion;  Surgeon: [name], MD;  Location: [facility] ORS;  Service: Cardiovascular;  Laterality: N/A;   CHOLECYSTECTOMY  1985   ESOPHAGOGASTRODUODENOSCOPY (EGD) WITH PROPOFOL N/A 11/27/2019   Procedure: ESOPHAGOGASTRODUODENOSCOPY (EGD) WITH PROPOFOL;  Surgeon: Lesly Rubenstein, MD;  Location: [facility] ENDOSCOPY;  Service: Endoscopy;  Laterality: N/A;   ESOPHAGOGASTRODUODENOSCOPY (EGD) WITH PROPOFOL N/A 12/24/2019   Procedure: ESOPHAGOGASTRODUODENOSCOPY (EGD) WITH PROPOFOL;  Surgeon: Lesly Rubenstein, MD;  Location: [facility] ENDOSCOPY;  Service: Endoscopy;  Laterality: N/A;   OVARIAN CYST REMOVAL     TEE WITHOUT CARDIOVERSION N/A 02/01/2018  Procedure: TRANSESOPHAGEAL ECHOCARDIOGRAM (TEE);  Surgeon: [name], MD;  Location: [facility] ORS;  Service: Cardiovascular;  Laterality: N/A;    Prior to Admission medications   Medication Sig Start Date End Date Taking?  Authorizing Provider  ALPRAZolam (XANAX) 0.25 MG tablet Take 1 tablet (0.25 mg total) by mouth at bedtime as needed for anxiety or sleep. 08/26/20   [name], MD  amiodarone (PACERONE) 200 MG tablet Take 200 mg by mouth 2 (two) times daily.    [provider]  Bevacizumab (AVASTIN IV) 25 mg/mL by Intravitreal route. Every nine weeks    [provider]  EUTHYROX 50 MCG tablet TAKE 1 TABLET BY MOUTH ONCE DAILY 30  MINUTES  PRIOR  TO  EATING 07/06/20   [name], MD  XARELTO 15 MG TABS tablet TAKE 1 TABLET EVERY DAY 08/13/20   [name], MD    Allergies Statins  Family History  Problem Relation Age of Onset   Cancer Mother 50       breast cancer, lived to 62,    Breast cancer Mother 59   Cancer Son 7       pancreatic cancer   Heart disease Son        CAD, Tobacco Abuse    Heart disease Maternal Grandmother 80       died of massive MI   Stroke Sister     Social History Social History   Tobacco Use   Smoking status: Never   Smokeless tobacco: Never   Tobacco comments:    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 , worked at [employer], Development worker, 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 used  Substance Use Topics   Alcohol use: No   Drug use: No    Review of Systems Constitutional: No fever/chills. Generalized weakness. Eyes: No visual changes. ENT: No sore throat. Cardiovascular: Denies chest pain. Positive for palpitations. Respiratory: Denies shortness of breath. Gastrointestinal: No abdominal pain.  No nausea, no vomiting.  No diarrhea.   Genitourinary: Negative for dysuria. Musculoskeletal: Negative for back pain. Skin: Negative for rash. Neurological: Negative for headaches, focal weakness or numbness.  ____________________________________________   PHYSICAL EXAM:  VITAL SIGNS: ED Triage Vitals  Enc Vitals Group     BP 09/07/20 2130 (!) 135/58     Pulse Rate 09/07/20 2130 (!) 156     Resp 09/07/20 2130 (!) 23     Temp 09/07/20 2135 98 F (36.7 C)      Temp Source 09/07/20 2135 Oral     SpO2 09/07/20 2130 97 %     Weight 09/07/20 2116 103 lb (46.7 kg)     Height 09/07/20 2116 '5\' 3"'$  (1.6 m)     Head Circumference --      Peak Flow --      Pain Score 09/07/20 2135 0   Constitutional: Alert and oriented.  Eyes: Conjunctivae are normal.  ENT      Head: Normocephalic and atraumatic.      Nose: No congestion/rhinnorhea.      Mouth/Throat: Mucous membranes are moist.      Neck: No stridor. Hematological/Lymphatic/Immunilogical: No cervical lymphadenopathy. Cardiovascular: Tachycardia, regular rhythm.  No murmurs, rubs, or gallops.  Respiratory: Normal respiratory effort without tachypnea nor retractions. Breath sounds are clear and equal bilaterally. No wheezes/rales/rhonchi. Gastrointestinal: Soft and non tender. No rebound. No guarding.  Genitourinary: Deferred Musculoskeletal: Normal range of motion in all extremities. No lower extremity edema. Neurologic:  Normal speech and language. No gross focal  neurologic deficits are appreciated.  Skin:  Skin is warm, dry and intact. No rash noted. Psychiatric: Mood and affect are normal. Speech and behavior are normal. Patient exhibits appropriate insight and judgment.  ____________________________________________    LABS (pertinent positives/negatives)  Trop hs 21 CBC wbc 6.3, hgb 12.5, plt 288 BMP na 138, k 4.7, glu 130, cr 1.32  ____________________________________________   EKG  I, Nance Pear, attending physician, personally viewed and interpreted this EKG  EKG Time: 2118 Rate: 158 Rhythm: SVT vs afib with rvr Axis: left axis deviation Intervals: qtc 544 QRS: RBBB, LAFB ST changes: no st elevation Impression: abnormal ekg   ____________________________________________    RADIOLOGY  None   ____________________________________________   PROCEDURES  Procedures  ____________________________________________   INITIAL IMPRESSION / ASSESSMENT AND PLAN / ED  COURSE  Pertinent labs & imaging results that were available during my care of the patient were reviewed by me and considered in my medical decision making (see chart for details).   Patient presented to the emergency department today because of concerns for weakness and fast heart rate.  Initial EKG did show a tachycardia.  Somewhat unclear if it was SVT versus atrial fibrillation.  Patient does have a history of both.  Because of this I chose to give patient diltiazem.  This did significantly slow the patient's rhythm and repeat EKG was consistent with atrial fibrillation.  Initial blood work shows perhaps some slight elevation of creatinine. Troponin minimally elevated. Will give IVFs and repeat troponin.   ____________________________________________   FINAL CLINICAL IMPRESSION(S) / ED DIAGNOSES  Final diagnoses:  Tachycardia     Note: This dictation was prepared with Dragon dictation. Any transcriptional errors that result from this process are unintentional     Nance Pear, MD 09/07/20 2259

## 2020-09-07 NOTE — ED Triage Notes (Signed)
Pt reports feeling weak and rapid heart rate today.   No chest pain  pt has sob.  Pt alert  speech clear.  Pt has nausea and no appetite.

## 2020-09-07 NOTE — ED Provider Notes (Addendum)
-----------------------------------------   11:52 PM on 09/07/2020 -----------------------------------------   Updated patient and daughter on repeat troponin which is trending down.  Patient feels much better and is eager for discharge home.  IV fluids completing.  Currently heart rate 110s sinus tachycardia.  She is on amiodarone and was recently taken off metoprolol 12.5 mg twice daily due to low heart rate.  She denies having symptoms on metoprolol.  Has appointment with her cardiologist Dr. [name] in 2 days time.  After thorough discussion with patient and her daughter, we agreed to give her a oral dose of metoprolol 12.5 mg now.  Her daughter will call cardiology in the morning. Strict return precautions given.  Both verbalized understanding agree with plan of care.    ----------------------------------------- 12:52 AM on 09/08/2020 -----------------------------------------   ED ECG REPORT I, Sharri Loya J, the attending physician, personally viewed and interpreted this ECG.   Date: 09/08/2020  EKG Time: 0048  Rate: 119  Rhythm: sinus tachycardia  Axis: LAD  Intervals:left anterior fascicular block  ST&T Change: Nonspecific  Patient was given oral metoprolol.  Repeat EKG demonstrates sinus tachycardia.  Patient voices no complaints at this time.  She initially came in for weakness, shortness of breath and nausea.  Will check chest x-ray and urinalysis.  Give additional IV fluids.  Will reassess.   ----------------------------------------- 1:38 AM on 09/08/2020 -----------------------------------------   Although patient's heart rate and blood pressure have improved, she had an episode where she was pale and diaphoretic, felt nauseous and dizzy.  Near syncopal in the bed.  Will expand work-up to add thyroid function, LFTs/lipase, perform orthostatics and ambulate patient.   ----------------------------------------- 1:58 AM on  09/08/2020 -----------------------------------------  On ambulation, patient felt hot, dizzy, weak and "not right".  She was slightly tachypneic but not tachycardic nor hypoxic.   ----------------------------------------- 2:48 AM on 09/08/2020 -----------------------------------------   Updated patient and daughter on all test results.  Will discuss with hospitalist services for admission.     Paulette Blanch, MD 09/08/20 [phone]

## 2020-09-08 ENCOUNTER — Other Ambulatory Visit: Payer: Self-pay

## 2020-09-08 ENCOUNTER — Emergency Department: Payer: Medicare HMO

## 2020-09-08 DIAGNOSIS — H3530 Unspecified macular degeneration: Secondary | ICD-10-CM | POA: Diagnosis present

## 2020-09-08 DIAGNOSIS — I272 Pulmonary hypertension, unspecified: Secondary | ICD-10-CM | POA: Diagnosis present

## 2020-09-08 DIAGNOSIS — Z800 Family history of malignant neoplasm of digestive organs: Secondary | ICD-10-CM | POA: Diagnosis not present

## 2020-09-08 DIAGNOSIS — I4891 Unspecified atrial fibrillation: Secondary | ICD-10-CM | POA: Diagnosis not present

## 2020-09-08 DIAGNOSIS — I480 Paroxysmal atrial fibrillation: Secondary | ICD-10-CM | POA: Diagnosis present

## 2020-09-08 DIAGNOSIS — R000 Tachycardia, unspecified: Secondary | ICD-10-CM | POA: Diagnosis present

## 2020-09-08 DIAGNOSIS — Z20822 Contact with and (suspected) exposure to covid-19: Secondary | ICD-10-CM | POA: Diagnosis present

## 2020-09-08 DIAGNOSIS — Z8249 Family history of ischemic heart disease and other diseases of the circulatory system: Secondary | ICD-10-CM | POA: Diagnosis not present

## 2020-09-08 DIAGNOSIS — I444 Left anterior fascicular block: Secondary | ICD-10-CM | POA: Diagnosis present

## 2020-09-08 DIAGNOSIS — R7989 Other specified abnormal findings of blood chemistry: Secondary | ICD-10-CM | POA: Diagnosis not present

## 2020-09-08 DIAGNOSIS — N1831 Chronic kidney disease, stage 3a: Secondary | ICD-10-CM | POA: Diagnosis present

## 2020-09-08 DIAGNOSIS — R531 Weakness: Secondary | ICD-10-CM | POA: Diagnosis not present

## 2020-09-08 DIAGNOSIS — I495 Sick sinus syndrome: Secondary | ICD-10-CM | POA: Diagnosis present

## 2020-09-08 DIAGNOSIS — I517 Cardiomegaly: Secondary | ICD-10-CM | POA: Diagnosis not present

## 2020-09-08 DIAGNOSIS — I471 Supraventricular tachycardia: Secondary | ICD-10-CM | POA: Diagnosis present

## 2020-09-08 DIAGNOSIS — R110 Nausea: Secondary | ICD-10-CM | POA: Diagnosis present

## 2020-09-08 DIAGNOSIS — Z7901 Long term (current) use of anticoagulants: Secondary | ICD-10-CM | POA: Diagnosis not present

## 2020-09-08 DIAGNOSIS — K219 Gastro-esophageal reflux disease without esophagitis: Secondary | ICD-10-CM | POA: Diagnosis present

## 2020-09-08 DIAGNOSIS — Z86711 Personal history of pulmonary embolism: Secondary | ICD-10-CM | POA: Diagnosis not present

## 2020-09-08 DIAGNOSIS — Z803 Family history of malignant neoplasm of breast: Secondary | ICD-10-CM | POA: Diagnosis not present

## 2020-09-08 DIAGNOSIS — E039 Hypothyroidism, unspecified: Secondary | ICD-10-CM | POA: Diagnosis present

## 2020-09-08 DIAGNOSIS — I5022 Chronic systolic (congestive) heart failure: Secondary | ICD-10-CM | POA: Diagnosis present

## 2020-09-08 DIAGNOSIS — R002 Palpitations: Secondary | ICD-10-CM | POA: Diagnosis present

## 2020-09-08 DIAGNOSIS — I130 Hypertensive heart and chronic kidney disease with heart failure and stage 1 through stage 4 chronic kidney disease, or unspecified chronic kidney disease: Secondary | ICD-10-CM | POA: Diagnosis present

## 2020-09-08 DIAGNOSIS — F419 Anxiety disorder, unspecified: Secondary | ICD-10-CM | POA: Diagnosis present

## 2020-09-08 DIAGNOSIS — I700 Atherosclerosis of aorta: Secondary | ICD-10-CM | POA: Diagnosis not present

## 2020-09-08 DIAGNOSIS — Z79899 Other long term (current) drug therapy: Secondary | ICD-10-CM | POA: Diagnosis not present

## 2020-09-08 DIAGNOSIS — E785 Hyperlipidemia, unspecified: Secondary | ICD-10-CM | POA: Diagnosis present

## 2020-09-08 DIAGNOSIS — Z823 Family history of stroke: Secondary | ICD-10-CM | POA: Diagnosis not present

## 2020-09-08 DIAGNOSIS — Z888 Allergy status to other drugs, medicaments and biological substances status: Secondary | ICD-10-CM | POA: Diagnosis not present

## 2020-09-08 LAB — URINALYSIS, COMPLETE (UACMP) WITH MICROSCOPIC
Bacteria, UA: NONE SEEN
Bilirubin Urine: NEGATIVE
Glucose, UA: NEGATIVE mg/dL
Hgb urine dipstick: NEGATIVE
Ketones, ur: 5 mg/dL — AB
Leukocytes,Ua: NEGATIVE
Nitrite: NEGATIVE
Protein, ur: 100 mg/dL — AB
Specific Gravity, Urine: 1.024 (ref 1.005–1.030)
pH: 6 (ref 5.0–8.0)

## 2020-09-08 LAB — T4, FREE: Free T4: 2.15 ng/dL — ABNORMAL HIGH (ref 0.61–1.12)

## 2020-09-08 LAB — BASIC METABOLIC PANEL
Anion gap: 7 (ref 5–15)
BUN: 14 mg/dL (ref 8–23)
CO2: 23 mmol/L (ref 22–32)
Calcium: 8.3 mg/dL — ABNORMAL LOW (ref 8.9–10.3)
Chloride: 107 mmol/L (ref 98–111)
Creatinine, Ser: 1.16 mg/dL — ABNORMAL HIGH (ref 0.44–1.00)
GFR, Estimated: 45 mL/min — ABNORMAL LOW (ref >60)
Glucose, Bld: 100 mg/dL — ABNORMAL HIGH (ref 70–99)
Potassium: 4.6 mmol/L (ref 3.5–5.1)
Sodium: 137 mmol/L (ref 135–145)

## 2020-09-08 LAB — RESP PANEL BY RT-PCR (FLU A&B, COVID) ARPGX2
Influenza A by PCR: NEGATIVE
Influenza B by PCR: NEGATIVE
SARS Coronavirus 2 by RT PCR: NEGATIVE

## 2020-09-08 LAB — HEPATIC FUNCTION PANEL
ALT: 31 U/L (ref 0–44)
AST: 40 U/L (ref 15–41)
Albumin: 3.4 g/dL — ABNORMAL LOW (ref 3.5–5.0)
Alkaline Phosphatase: 106 U/L (ref 38–126)
Bilirubin, Direct: 0.2 mg/dL (ref 0.0–0.2)
Indirect Bilirubin: 0.6 mg/dL (ref 0.3–0.9)
Total Bilirubin: 0.8 mg/dL (ref 0.3–1.2)
Total Protein: 6.4 g/dL — ABNORMAL LOW (ref 6.5–8.1)

## 2020-09-08 LAB — LIPASE, BLOOD: Lipase: 37 U/L (ref 11–51)

## 2020-09-08 LAB — TSH: TSH: 2.059 u[IU]/mL (ref 0.350–4.500)

## 2020-09-08 LAB — MAGNESIUM: Magnesium: 2.1 mg/dL (ref 1.7–2.4)

## 2020-09-08 MED ORDER — SODIUM CHLORIDE 0.9 % IV BOLUS
500.0000 mL | Freq: Once | INTRAVENOUS | Status: AC
  Administered 2020-09-08: 500 mL via INTRAVENOUS

## 2020-09-08 MED ORDER — ACETAMINOPHEN 325 MG PO TABS: 650.0000 mg | ORAL_TABLET | Freq: Four times a day (QID) | ORAL | Status: DC | PRN

## 2020-09-08 MED ORDER — ACETAMINOPHEN 650 MG RE SUPP: 650.0000 mg | Freq: Four times a day (QID) | RECTAL | Status: DC | PRN

## 2020-09-08 MED ORDER — ONDANSETRON HCL 4 MG/2ML IJ SOLN: 4.0000 mg | Freq: Four times a day (QID) | INTRAMUSCULAR | Status: DC | PRN

## 2020-09-08 MED ORDER — SENNOSIDES-DOCUSATE SODIUM 8.6-50 MG PO TABS: 1.0000 | ORAL_TABLET | Freq: Every evening | ORAL | Status: DC | PRN

## 2020-09-08 MED ORDER — DILTIAZEM HCL 30 MG PO TABS
60.0000 mg | ORAL_TABLET | Freq: Three times a day (TID) | ORAL | Status: DC
  Administered 2020-09-08 – 2020-09-09 (×3): 60 mg via ORAL
  Filled 2020-09-08: qty 1
  Filled 2020-09-08 (×2): qty 2

## 2020-09-08 MED ORDER — AMIODARONE HCL 200 MG PO TABS
200.0000 mg | ORAL_TABLET | Freq: Two times a day (BID) | ORAL | Status: DC
  Administered 2020-09-08 – 2020-09-09 (×3): 200 mg via ORAL
  Filled 2020-09-08 (×5): qty 1

## 2020-09-08 MED ORDER — AMIODARONE HCL 200 MG PO TABS: 200.0000 mg | ORAL_TABLET | Freq: Two times a day (BID) | ORAL | Status: DC

## 2020-09-08 MED ORDER — ONDANSETRON HCL 4 MG PO TABS: 4.0000 mg | ORAL_TABLET | Freq: Four times a day (QID) | ORAL | Status: DC | PRN

## 2020-09-08 MED ORDER — RIVAROXABAN 15 MG PO TABS
15.0000 mg | ORAL_TABLET | Freq: Every day | ORAL | Status: DC
  Administered 2020-09-08 – 2020-09-09 (×2): 15 mg via ORAL
  Filled 2020-09-08 (×2): qty 1

## 2020-09-08 NOTE — ED Notes (Signed)
Transport requested for patient.  

## 2020-09-08 NOTE — Progress Notes (Signed)
SBAR INFO: She is alert, oriented x4, and ambulatory with a steady gait. She is VERY young for 67. She came in last night for AFIB in the high 160s, got IV Diltiazem, and the rate came down to the low 100s. She is now NSR since getting PO cardizem and fluids over night. She is being monitored overnight per Dr. Kurtis Bushman "just in case," although cards thinks she's stable to go. She walks to the restroom independently. She has a 20 in the left [facility].

## 2020-09-08 NOTE — ED Notes (Signed)
Patient calling out to nurse's station at this time to report feeling hot, dizzy, and generally "not right." Patient noted to be pale on entry to the room, with some mild diaphoresis noted. Patient VS WNL on monitor, cardiac rhythm unchanged from previous. Temp 98.9. Patient BP 141/72. Dr. Beather Arbour notified of patient complaint, and wet rag provided to patient for comfort.

## 2020-09-08 NOTE — ED Notes (Signed)
Report to [facility], Therapist, sports.

## 2020-09-08 NOTE — H&P (Signed)
History and Physical    Frances Kent [id number] DOB: September 25, 1930 DOA: 09/07/2020  PCP: Frances Mc, MD   Patient coming from: home  I have personally briefly reviewed patient's old medical records in [place]  Chief Complaint: palpitations  HPI: Frances Kent is a 87 y.o. female with medical history significant for Paroxysmal atrial fibrillation on amiodarone and Xarelto, sick sinus syndrome, hypothyroidism, anxiety and CKD stage IIIa, who presents to the emergency room with a complaint of palpitations and nausea.  She denied chest pain or shortness of breath.  Denied lightheadedness, visual disturbance or one-sided weakness numbness or tingling.  Chart review reveals that she saw her cardiologist on 6/28 and her metoprolol was discontinued due to symptomatic bradycardia in the 50s.  It was noted that consideration is being given to pacemaker placement in the future.  ED course: On arrival, heart rate on the monitor showed wide complex tachycardia at 158.  Vitals were otherwise within normal limits Troponin 21/18.  Free T4 2.15.  Creatinine 1.32 which is baseline, CBC and BMP and lipase, hepatic function panel, TSH mostly unremarkable  EKG, personally viewed and interpreted EKG #1: ?Wide-complex?  Narrow complex with aberrancy tachycardia at 158 EKG #3: S/p diltiazem, sinus tachycardia at 119  Imaging: Chest x-ray with stable cardiomegaly and chronic appearing increased interstitial lung markings without acute cardiopulmonary disease  Patient was given a dose of diltiazem 15 mg IV initially followed a couple hours later by an oral dose of metoprolol 12.5 mg.  Hospitalist consulted for admission.  Review of Systems: As per HPI otherwise all other systems on review of systems negative.    Past Medical History:  Diagnosis Date   Atrial fibrillation ([place])    Benign breast cyst in female, left 10/07/2016   Colon adenomas    GERD (gastroesophageal reflux disease)     Hypertension    Hypothyroidism    Macular degeneration    Mitral regurgitation    Pulmonary embolism ([place]) 02/2016   SVT (supraventricular tachycardia) ([place])     Past Surgical History:  Procedure Laterality Date   APPENDECTOMY  1960   BREAST CYST ASPIRATION Left 2017   CARDIOVERSION N/A 04/05/2016   Procedure: Cardioversion;  Surgeon: Frances Skains, MD;  Location: [facility] ORS;  Service: Cardiovascular;  Laterality: N/A;   CHOLECYSTECTOMY  1985   ESOPHAGOGASTRODUODENOSCOPY (EGD) WITH PROPOFOL N/A 11/27/2019   Procedure: ESOPHAGOGASTRODUODENOSCOPY (EGD) WITH PROPOFOL;  Surgeon: Frances Rubenstein, MD;  Location: [facility] ENDOSCOPY;  Service: Endoscopy;  Laterality: N/A;   ESOPHAGOGASTRODUODENOSCOPY (EGD) WITH PROPOFOL N/A 12/24/2019   Procedure: ESOPHAGOGASTRODUODENOSCOPY (EGD) WITH PROPOFOL;  Surgeon: Frances Rubenstein, MD;  Location: [facility] ENDOSCOPY;  Service: Endoscopy;  Laterality: N/A;   OVARIAN CYST REMOVAL     TEE WITHOUT CARDIOVERSION N/A 02/01/2018   Procedure: TRANSESOPHAGEAL ECHOCARDIOGRAM (TEE);  Surgeon: Frances Skains, MD;  Location: [facility] ORS;  Service: Cardiovascular;  Laterality: N/A;     reports that she has never smoked. She has never used smokeless tobacco. She reports that she does not drink alcohol and does not use drugs.  Allergies  Allergen Reactions   Statins     Severe myalgias    Family History  Problem Relation Age of Onset   Cancer Mother 58       breast cancer, lived to 87,    Breast cancer Mother 16   Cancer Son 50       pancreatic cancer   Heart disease Son  CAD, Tobacco Abuse    Heart disease Maternal Grandmother 80       died of massive MI   Stroke Sister       Prior to Admission medications   Medication Sig Start Date End Date Taking? Authorizing Provider  ALPRAZolam (XANAX) 0.25 MG tablet Take 1 tablet (0.25 mg total) by mouth at bedtime as needed for anxiety or sleep. 08/26/20   Frances Mc, MD  amiodarone (PACERONE) 200 MG  tablet Take 200 mg by mouth 2 (two) times daily.    [provider]  Bevacizumab (AVASTIN IV) 25 mg/mL by Intravitreal route. Every nine weeks    [provider]  EUTHYROX 50 MCG tablet TAKE 1 TABLET BY MOUTH ONCE DAILY 30  MINUTES  PRIOR  TO  EATING 07/06/20   Frances Mc, MD  XARELTO 15 MG TABS tablet TAKE 1 TABLET EVERY DAY 08/13/20   Frances Mc, MD    Physical Exam: Vitals:   09/08/20 0145 09/08/20 0150 09/08/20 0215 09/08/20 0230  BP: 118/84 126/72 126/72 130/87  Pulse: 94  62 92  Resp: '20 18 14 20  '$ Temp:      TempSrc:      SpO2: 93% 96% 95% 95%  Weight:      Height:         Vitals:   09/08/20 0145 09/08/20 0150 09/08/20 0215 09/08/20 0230  BP: 118/84 126/72 126/72 130/87  Pulse: 94  62 92  Resp: '20 18 14 20  '$ Temp:      TempSrc:      SpO2: 93% 96% 95% 95%  Weight:      Height:          Constitutional: Alert and oriented x 3 . Not in any apparent distress HEENT:      Head: Normocephalic and atraumatic.         Eyes: PERLA, EOMI, Conjunctivae are normal. Sclera is non-icteric.       Mouth/Throat: Mucous membranes are moist.       Neck: Supple with no signs of meningismus. Cardiovascular: Regular rate and rhythm. No murmurs, gallops, or rubs. 2+ symmetrical distal pulses are present . No JVD. No LE edema Respiratory: Respiratory effort normal .Lungs sounds clear bilaterally. No wheezes, crackles, or rhonchi.  Gastrointestinal: Soft, non tender, and non distended with positive bowel sounds.  Genitourinary: No CVA tenderness. Musculoskeletal: Nontender with normal range of motion in all extremities. No cyanosis, or erythema of extremities. Neurologic:  Face is symmetric. Moving all extremities. No gross focal neurologic deficits . Skin: Skin is warm, dry.  No rash or ulcers Psychiatric: Mood and affect are norma    Labs on Admission: I have personally reviewed following labs and imaging studies  CBC: Recent Labs  Lab 09/07/20 2134  WBC  6.3  HGB 12.5  HCT 39.6  MCV 89.8  PLT [id number]   Basic Metabolic Panel: Recent Labs  Lab 09/07/20 2134  NA 138  K 4.7  CL 104  CO2 24  GLUCOSE 130*  BUN 13  CREATININE 1.32*  CALCIUM 9.1   GFR: Estimated Creatinine Clearance: 21.7 mL/min (A) (by C-G formula based on SCr of 1.32 mg/dL (H)). Liver Function Tests: Recent Labs  Lab 09/07/20 2307  AST 40  ALT 31  ALKPHOS 106  BILITOT 0.8  PROT 6.4*  ALBUMIN 3.4*   Recent Labs  Lab 09/07/20 2307  LIPASE 37   No results for input(s): AMMONIA in the last 168 hours. Coagulation Profile: No  results for input(s): INR, PROTIME in the last 168 hours. Cardiac Enzymes: No results for input(s): CKTOTAL, CKMB, CKMBINDEX, TROPONINI in the last 168 hours. BNP (last 3 results) No results for input(s): PROBNP in the last 8760 hours. HbA1C: No results for input(s): HGBA1C in the last 72 hours. CBG: No results for input(s): GLUCAP in the last 168 hours. Lipid Profile: No results for input(s): CHOL, HDL, LDLCALC, TRIG, CHOLHDL, LDLDIRECT in the last 72 hours. Thyroid Function Tests: Recent Labs    09/07/20 2307  TSH 2.059  FREET4 2.15*   Anemia Panel: No results for input(s): VITAMINB12, FOLATE, FERRITIN, TIBC, IRON, RETICCTPCT in the last 72 hours. Urine analysis:    Component Value Date/Time   COLORURINE YELLOW (A) 09/08/2020 0105   APPEARANCEUR HAZY (A) 09/08/2020 0105   APPEARANCEUR Clear 02/05/2013 1630   LABSPEC 1.024 09/08/2020 0105   LABSPEC 1.009 02/05/2013 1630   PHURINE 6.0 09/08/2020 0105   GLUCOSEU NEGATIVE 09/08/2020 0105   GLUCOSEU NEGATIVE 04/04/2018 1149   HGBUR NEGATIVE 09/08/2020 0105   BILIRUBINUR NEGATIVE 09/08/2020 0105   BILIRUBINUR 1 08/31/2017 1157   BILIRUBINUR Negative 02/05/2013 1630   KETONESUR 5 (A) 09/08/2020 0105   PROTEINUR 100 (A) 09/08/2020 0105   UROBILINOGEN 1.0 04/04/2018 1149   NITRITE NEGATIVE 09/08/2020 0105   LEUKOCYTESUR NEGATIVE 09/08/2020 0105   LEUKOCYTESUR 1+ 02/05/2013  1630    Radiological Exams on Admission: DG Chest Port 1 View  Result Date: 09/08/2020 CLINICAL DATA:  Weakness and tachycardia. EXAM: PORTABLE CHEST 1 VIEW COMPARISON:  Jul 12, 2020 FINDINGS: Mild, diffuse, chronic appearing increased interstitial lung markings are seen. There is no evidence of acute infiltrate, pleural effusion pneumothorax. Stable moderate severity enlargement of the cardiac silhouette is seen. Moderate severity calcification of the aortic arch is noted. The visualized skeletal structures are unremarkable. IMPRESSION: Stable cardiomegaly and chronic appearing increased interstitial lung markings without acute cardiopulmonary disease. Electronically Signed   By: Virgina Norfolk M.D.   On: 09/08/2020 02:45     Assessment/Plan 87 year old female with history of paroxysmal atrial fibrillation on amiodarone and Xarelto, sick sinus syndrome, hypothyroidism, anxiety and CKD stage IIIa, presenting with palpitations and nausea.      Tachyarrhythmia   AF (paroxysmal atrial fibrillation)    Sick sinus syndrome ([place]) -Patient presenting with tachyarrhythmia up to 158 improving following IV diltiazem, in the setting of recent discontinuation of metoprolol on 6/28 for symptomatic bradycardia - Continue home amiodarone - Restart metoprolol - Continue Xarelto for stroke prevention for A. fib - Continuous cardiac monitoring - Cardiology consult - Maintain potassium over 4, mag over 2    Elevated serum free T4 level, with history of hypothyroidism - Free T4 elevated at 2.15 but with normal TSH - Hold thyroid replacement for now     Stage 3a chronic kidney disease ([place]) - Renal function at baseline  DVT prophylaxis: Xarelto Code Status: full code  Family Communication: Daughter at bedside.  Plan of care explained to both patient and daughter.  They verbalized understanding and in agreement with plan Disposition Plan: Back to previous home environment Consults called:  Cardiology Status:At the time of admission, it appears that the appropriate admission status for this patient is INPATIENT. This is judged to be reasonable and necessary in order to provide the required intensity of service to ensure the patient's safety given the presenting symptoms, physical exam findings, and initial radiographic and laboratory data in the context of their  Comorbid conditions.   Patient requires inpatient status due to  high intensity of service, high risk for further deterioration and high frequency of surveillance required.   I certify that at the point of admission it is my clinical judgment that the patient will require inpatient hospital care spanning beyond [place] MD [facility]     09/08/2020, 3:21 AM

## 2020-09-08 NOTE — Progress Notes (Signed)
Patient ID: Frances Kent, female   DOB: [birth year], 87 y.o.   MRN: [id number] This is a no charge note as patient was admitted this AM.  Patient seen and examined.  H&P reviewed.  Daughter at bedside.  Frances Kent is a 87 y.o. female with medical history significant for Paroxysmal atrial fibrillation on amiodarone and Xarelto, sick sinus syndrome, hypothyroidism, anxiety and CKD stage IIIa, who presents to the emergency room with a complaint of palpitations and nausea.  She denied chest pain or shortness of breath.  Denied lightheadedness, visual disturbance or one-sided weakness numbness or tingling.  Chart review reveals that she saw her cardiologist on 6/28 and her metoprolol was discontinued due to symptomatic bradycardia in the 50s.  It was noted that consideration is being given to pacemaker placement in the future.   Currently she denies cp, sob, dizziness.   Cta no r/r Reg-irreg, s1/s2 no gallop Soft benign +bs No edema  A/P tachy-brady Cardiology consulted [facility] May consider PPM, will defer to cards

## 2020-09-08 NOTE — ED Notes (Signed)
Patient assisted to bathroom in room. Patient gait steady.

## 2020-09-08 NOTE — ED Notes (Signed)
Patient reports she was told she may get to be discharged today. This RN contacted Dr. Kurtis Bushman, who requests the patient be monitored overnight. Patient updated.

## 2020-09-08 NOTE — ED Notes (Signed)
Patient is resting comfortably. Admitting/consulting provider at bedside.

## 2020-09-08 NOTE — ED Notes (Signed)
Patient moved to ED31 pending dispo to inpatient unit. Report received from [place], Therapist, sports.

## 2020-09-08 NOTE — Consult Note (Signed)
CARDIOLOGY CONSULT NOTE               Patient ID: Frances Kent MRN: [id number] DOB/AGE: 14-Nov-1930 87 y.o.  Admit date: 09/07/2020 Referring Physician: Nolberto Hanlon, MD  Primary Physician: [name], MD  Primary Cardiologist: Flossie Dibble, MD  Reason for Consultation: atrial fibrillation with RVR   HPI: Frances Kent is a 87 year old female patient with PMH significant for paroxysmal atrial fibrillation, sick sinus syndrome, bradycardia, h/o HFrEF, hypertension, chronic pulmonary hypertension, hyperlipidemia, hypothyroidism, anxiety and CKD stage IIIa who was admitted due to atrial fibrillation with RVR.  ED course: The patient arrived from home with complaints of generalized weakness and tachycardia.  The patient was found to be in atrial fibrillation with RVR confirmed by ECG.  High-sensitivity troponin mildly elevated at 21>>18.  Creatinine slightly elevated at 1.32>>1.16, TSH = 2.059, T4 elevated at 2.15.  CXR revealed stable cardiomegaly.  The patient was treated with with 15 mg IV diltiazem, metoprolol tartrate 12.5 mg orally and a 500 cc normal saline bolus.  The patient was successfully converted back to normal sinus rhythm.   Upon examination on today the patient reports that she is feeling well and she states that her previous symptoms of tachycardia and generalized weakness have completely resolved.  The patient denies having any chest pain, peripheral edema, syncope or dizziness at this time.  The patient reports that she was seen by her primary cardiologist recently and was taken off of the metoprolol therapy; however, the patient reports that she is scheduled to see her primary cardiologist on Thursday.  The patient reports that she has been compliant with her amiodarone and Xarelto therapy.  The patient reports that she is ready to be discharged home and is agreeable to follow-up with Dr. [name] on Thursday.   Review of systems complete and found to be  negative unless listed above     Past Medical History:  Diagnosis Date   Atrial fibrillation ([place])    Benign breast cyst in female, left 10/07/2016   Colon adenomas    GERD (gastroesophageal reflux disease)    Hypertension    Hypothyroidism    Macular degeneration    Mitral regurgitation    Pulmonary embolism ([place]) 02/2016   SVT (supraventricular tachycardia) ([place])     Past Surgical History:  Procedure Laterality Date   APPENDECTOMY  1960   BREAST CYST ASPIRATION Left 2017   CARDIOVERSION N/A 04/05/2016   Procedure: Cardioversion;  Surgeon: [name], MD;  Location: [facility] OR[facility];  Service: Cardiovascular;  Laterality: N/A;   CHOLECYSTECTOMY  1985   ESOPHAGOGASTRODUODENOSCOPY (EGD) WITH PROPOFOL N/A 11/27/2019   Procedure: ESOPHAGOGASTRODUODENOSCOPY (EGD) WITH PROPOFOL;  Surgeon: Lesly Rubenstein, MD;  Location: [facility];  Service: Endoscopy;  Laterality: N/A;   ESOPHAGOGASTRODUODENOSCOPY (EGD) WITH PROPOFOL N/A 12/24/2019   Procedure: ESOPHAGOGASTRODUODENOSCOPY (EGD) WITH PROPOFOL;  Surgeon: Lesly Rubenstein, MD;  Location: [facility] END[facility];  Service: Endoscopy;  Laterality: N/A;   OVARIAN CYST REMOVAL     TEE WITHOUT CARDIOVERSION N/A 02/01/2018   Procedure: TRANSESOPHAGEAL ECHOCARDIOGRAM (TEE);  Surgeon: [name], MD;  Location: [facility] OR[facility];  Service: Cardiovascular;  Laterality: N/A;    (Not in a hospital admission)  Social History   Socioeconomic History   Marital status: Widowed    Spouse name: Not on file   Number of children: Not on file   Years of education: Not on file   Highest education level: Not on file  Occupational History   Not  on file  Tobacco Use   Smoking status: Never   Smokeless tobacco: Never   Tobacco comments:    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 , worked at [employer], Development worker, 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 used  Substance and Sexual Activity   Alcohol use: No   Drug use: No   Sexual activity: Not Currently  Other Topics Concern    Not on file  Social History Narrative   Has caretaking responsibility for 3 young grandchildren who live with her   Social Determinants of Health   Financial Resource Strain: Not on file  Food Insecurity: Not on file  Transportation Needs: No Transportation Needs   Lack of Transportation (Medical): No   Lack of Transportation (Non-Medical): No  Physical Activity: Not on file  Stress: No Stress Concern Present   Feeling of Stress : Not at all  Social Connections: Not on file  Intimate Partner Violence: Not on file    Family History  Problem Relation Age of Onset   Cancer Mother 35       breast cancer, lived to 32,    Breast cancer Mother 17   Cancer Son 46       pancreatic cancer   Heart disease Son        CAD, Tobacco Abuse    Heart disease Maternal Grandmother 80       died of massive MI   Stroke Sister       Review of systems complete and found to be negative unless listed above      PHYSICAL EXAM  General: Well developed, well nourished, in no acute distress HEENT:  Normocephalic and atraumatic, PERRL Neck:  No JVD.  Lungs: Clear bilaterally to auscultation, chest expansion symmetrical.  No wheezes, rales or rhonchi. Heart: HRRR . Normal S1 and S2 without gallops or murmurs.  Abdomen: Bowel sounds are positive, abdomen soft and non-tender  Msk:  Normal strength and tone for age. Extremities: No clubbing, cyanosis or edema.   Neuro: Alert and oriented X 3. Psych:  Good affect, responds appropriately  Labs:   Lab Results  Component Value Date   WBC 6.3 09/07/2020   HGB 12.5 09/07/2020   HCT 39.6 09/07/2020   MCV 89.8 09/07/2020   PLT 288 09/07/2020    Recent Labs  Lab 09/07/20 2307 09/08/20 0650  NA  --  137  K  --  4.6  CL  --  107  CO2  --  23  BUN  --  14  CREATININE  --  1.16*  CALCIUM  --  8.3*  PROT 6.4*  --   BILITOT 0.8  --   ALKPHOS 106  --   ALT 31  --   AST 40  --   GLUCOSE  --  100*   Lab Results  Component Value Date    CKTOTAL 83 05/09/2018   TROPONINI <0.03 02/25/2018    Lab Results  Component Value Date   CHOL 284 (H) 12/11/2019   CHOL 208 (H) 08/31/2018   CHOL 264 (H) 07/24/2017   Lab Results  Component Value Date   HDL 100.10 12/11/2019   HDL 72.40 08/31/2018   HDL 73.60 07/24/2017   Lab Results  Component Value Date   LDLCALC 166 (H) 12/11/2019   LDLCALC 116 (H) 08/31/2018   LDLCALC 170 (H) 07/24/2017   Lab Results  Component Value Date   TRIG 91.0 12/11/2019   TRIG 94.0 08/31/2018   TRIG 103.0 07/24/2017   Lab  Results  Component Value Date   CHOLHDL 3 12/11/2019   CHOLHDL 3 08/31/2018   CHOLHDL 4 07/24/2017   Lab Results  Component Value Date   LDLDIRECT 185.3 03/27/2013   LDLDIRECT 156.1 03/23/2012   LDLDIRECT 181.4 01/17/2011      Radiology: [facility] Chest Port 1 View  Result Date: 09/08/2020 CLINICAL DATA:  Weakness and tachycardia. EXAM: PORTABLE CHEST 1 VIEW COMPARISON:  Jul 12, 2020 FINDINGS: Mild, diffuse, chronic appearing increased interstitial lung markings are seen. There is no evidence of acute infiltrate, pleural effusion pneumothorax. Stable moderate severity enlargement of the cardiac silhouette is seen. Moderate severity calcification of the aortic arch is noted. The visualized skeletal structures are unremarkable. IMPRESSION: Stable cardiomegaly and chronic appearing increased interstitial lung markings without acute cardiopulmonary disease. Electronically Signed   By: Virgina Norfolk M.D.   On: 09/08/2020 02:45    EKG: Atrial fibrillation   ASSESSMENT AND PLAN:  Frances Kent is a 87 year old female patient with PMH significant for paroxysmal atrial fibrillation, sick sinus syndrome, bradycardia, h/o HFrEF, hypertension, chronic pulmonary hypertension, hyperlipidemia, hypothyroidism, anxiety and CKD stage IIIa who was admitted due to atrial fibrillation with RVR thus cardiology was consulted. Upon ED arrival the patient was found to be in atrial fibrillation with  RVR confirmed by ECG.  High-sensitivity troponin mildly elevated at 21>>18 likely due to demand ischemia.  Creatinine slightly elevated at 1.32>>1.16 likely due to dehydration. CXR revealed stable cardiomegaly.  The patient was treated with with 15 mg IV diltiazem, metoprolol tartrate 12.5 mg orally and a 500 cc normal saline bolus while in the ED and she is now currently in NSR with a HR of 70 bpm; furthermore, her cardiac symptoms have completely resolved at this time and the patient desires to be discharged at this time.  She appears fairly well, her vss, she continues to be in NSR on the telemetry monitor. We recommend starting the patient on low-dose diltiazem therapy and following up with Dr. [name] on Thursday as scheduled.    Atrial fibrillation with RVR, paroxysmal, resolved at this time, patient successfully converted back to normal sinus rhythm Recommend continuing amiodarone therapy for rhythm management. Recommend continuing Xarelto therapy for stroke risk reduction. We will start the patient on low-dose diltiazem as an outpatient.  Diltiazem 60 mg tablet every 8 hours. Recommend follow-up with Dr. [name] on Thursday 09/10/20 as scheduled.  The patient is cleared from a cardiac perspective for discharge home on today.  H/o symptomatic sick sinus syndrome, stable, patient's heart rate at this tim is 76 bpm Recommend monitoring heart rate closely at home and reporting any s/s of bradycardia to Cardiology office immediately.  Recommend following fall precautions, stay adequately hydrated with water and electrolytes and slowly changing positions to avoid postural dizziness.  Hypertension, reasonably controlled, patient is normotensive at this time Recommend following the Dash diet and monitoring blood pressure daily at home with close documentation. Diltiazem will also contribute to better blood pressure control.  CKD stage IIIa, fairly stable Recommend staying adequately hydrated with  water and electrolytes. Recommend avoiding all OTC nephrotoxic agents including NSAIDs.  Thyroid disease, fairly stable Agree with current management.   Signed: Godson Pollan ACNPC-AG 09/08/2020, 12:54 PM

## 2020-09-08 NOTE — ED Notes (Signed)
ED Provider at bedside. 

## 2020-09-09 ENCOUNTER — Telehealth: Payer: Self-pay

## 2020-09-09 DIAGNOSIS — R7989 Other specified abnormal findings of blood chemistry: Secondary | ICD-10-CM

## 2020-09-09 DIAGNOSIS — N1831 Chronic kidney disease, stage 3a: Secondary | ICD-10-CM

## 2020-09-09 DIAGNOSIS — I480 Paroxysmal atrial fibrillation: Principal | ICD-10-CM

## 2020-09-09 LAB — CBC
HCT: 34.2 % — ABNORMAL LOW (ref 36.0–46.0)
Hemoglobin: 11.2 g/dL — ABNORMAL LOW (ref 12.0–15.0)
MCH: 28.6 pg (ref 26.0–34.0)
MCHC: 32.7 g/dL (ref 30.0–36.0)
MCV: 87.5 fL (ref 80.0–100.0)
Platelets: 239 10*3/uL (ref 150–400)
RBC: 3.91 MIL/uL (ref 3.87–5.11)
RDW: 17.9 % — ABNORMAL HIGH (ref 11.5–15.5)
WBC: 5.1 10*3/uL (ref 4.0–10.5)
nRBC: 0 % (ref 0.0–0.2)

## 2020-09-09 LAB — BASIC METABOLIC PANEL
Anion gap: 9 (ref 5–15)
BUN: 13 mg/dL (ref 8–23)
CO2: 22 mmol/L (ref 22–32)
Calcium: 8.3 mg/dL — ABNORMAL LOW (ref 8.9–10.3)
Chloride: 104 mmol/L (ref 98–111)
Creatinine, Ser: 1.02 mg/dL — ABNORMAL HIGH (ref 0.44–1.00)
GFR, Estimated: 53 mL/min — ABNORMAL LOW (ref >60)
Glucose, Bld: 95 mg/dL (ref 70–99)
Potassium: 3.7 mmol/L (ref 3.5–5.1)
Sodium: 135 mmol/L (ref 135–145)

## 2020-09-09 MED ORDER — LEVOTHYROXINE SODIUM 50 MCG PO TABS: ORAL_TABLET | ORAL | 0 refills | Status: DC

## 2020-09-09 MED ORDER — DILTIAZEM HCL 60 MG PO TABS: 60.0000 mg | ORAL_TABLET | Freq: Three times a day (TID) | ORAL | 0 refills | Status: DC

## 2020-09-09 NOTE — Telephone Encounter (Signed)
Pt called returning your call 

## 2020-09-09 NOTE — Progress Notes (Signed)
[facility]  Patient Description: Frances Kent is a 87 year old female patient with PMH significant for paroxysmal atrial fibrillation, sick sinus syndrome, bradycardia, h/o HFrEF, hypertension, chronic pulmonary hypertension, hyperlipidemia, hypothyroidism, anxiety and CKD stage IIIa who was admitted due to atrial fibrillation with RVR; thus cardiology was consulted.   SUBJECTIVE: The patient reports to be feeling significantly better on today. She denies having any cardiac symptoms at this time and states that she is ready to go home. The patient states that she has been able to ambulated around the room without any complications and states that she lives at home with family. The patient states that she is aware of her appointment on tomorrow with Dr. [name].   OBJECTIVE: The patient appears well on today and is appears asymptomatic. The patient is now back in atrial fibrillation on the beside monitor but has a controlled rate in the 70's at this time. The patient's vss and she is in no apparent distress.   Vitals:   09/08/20 1943 09/09/20 0051 09/09/20 0516 09/09/20 0806  BP: 139/74 132/70 135/68 119/68  Pulse: 72 71 72 69  Resp: '18 19 16 16  '$ Temp: 98 F (36.7 C) 98 F (36.7 C) 98.2 F (36.8 C) 97.9 F (36.6 C)  TempSrc:      SpO2: 97% 95% 94% 95%  Weight:      Height:         Intake/Output Summary (Last 24 hours) at 09/09/2020 1002 Last data filed at 09/09/2020 0518 Gross per 24 hour  Intake 360 ml  Output --  Net 360 ml      PHYSICAL EXAM  General: Well developed, well nourished, in no acute distress HEENT:  Normocephalic and atraumatic, PERRL Neck:   No JVD. Lungs: Clear bilaterally to auscultation, chest expansion symmetrical.  No wheezes, rales or rhonchi. Heart: irregular RR . Normal S1 and S2 without gallops or murmurs. Abdomen: Bowel sounds are positive, abdomen soft and non-tender Msk:  Normal strength and tone for age. Extremities: No clubbing, cyanosis or  edema.   Neuro: Alert and oriented X 3. Psych:  Good affect, responds appropriately   LABS: Basic Metabolic Panel: Recent Labs    09/08/20 0650 09/09/20 0849  NA 137 135  K 4.6 3.7  CL 107 104  CO2 23 22  GLUCOSE 100* 95  BUN 14 13  CREATININE 1.16* 1.02*  CALCIUM 8.3* 8.3*  MG 2.1  --    Liver Function Tests: Recent Labs    09/07/20 2307  AST 40  ALT 31  ALKPHOS 106  BILITOT 0.8  PROT 6.4*  ALBUMIN 3.4*   Recent Labs    09/07/20 2307  LIPASE 37   CBC: Recent Labs    09/07/20 2134 09/09/20 0849  WBC 6.3 5.1  HGB 12.5 11.2*  HCT 39.6 34.2*  MCV 89.8 87.5  PLT 288 239   Cardiac Enzymes: No results for input(s): CKTOTAL, CKMB, CKMBINDEX, TROPONINI in the last 72 hours. BNP: Invalid input(s): POCBNP D-Dimer: No results for input(s): DDIMER in the last 72 hours. Hemoglobin A1C: No results for input(s): HGBA1C in the last 72 hours. Fasting Lipid Panel: No results for input(s): CHOL, HDL, LDLCALC, TRIG, CHOLHDL, LDLDIRECT in the last 72 hours. Thyroid Function Tests: Recent Labs    09/07/20 2307  TSH 2.059   Anemia Panel: No results for input(s): VITAMINB12, FOLATE, FERRITIN, TIBC, IRON, RETICCTPCT in the last 72 hours.  DG Chest Port 1 View  Result Date: 09/08/2020 CLINICAL DATA:  Weakness and  tachycardia. EXAM: PORTABLE CHEST 1 VIEW COMPARISON:  Jul 12, 2020 FINDINGS: Mild, diffuse, chronic appearing increased interstitial lung markings are seen. There is no evidence of acute infiltrate, pleural effusion pneumothorax. Stable moderate severity enlargement of the cardiac silhouette is seen. Moderate severity calcification of the aortic arch is noted. The visualized skeletal structures are unremarkable. IMPRESSION: Stable cardiomegaly and chronic appearing increased interstitial lung markings without acute cardiopulmonary disease. Electronically Signed   By: Virgina Norfolk M.D.   On: 09/08/2020 02:45     Echo: (08/15/2018) IMPRESSIONS   1. The left  ventricle has mild-moderately reduced systolic function, with  an ejection fraction of 40-45%. The cavity size was mildly dilated. There  is mildly increased left ventricular wall thickness. Left ventricular  diastolic parameters were normal.   2. The right ventricle has normal systolic function. The cavity was  normal. There is no increase in right ventricular wall thickness.   3. Left atrial size was mildly dilated.   4. Right atrial size was mildly dilated.   5. The mitral valve is myxomatous. Moderate thickening of the mitral  valve leaflet. Moderate mitral valve stenosis.   6. The aortic valve is tricuspid. Mild calcification of the aortic valve.  Aortic valve regurgitation is trivial by color flow Doppler.   7. The interatrial septum was not assessed.   TELEMETRY: atrial fibrillation with controlled rate   ASSESSMENT AND PLAN:  Principal Problem:   Tachyarrhythmia Active Problems:   Stage 3a chronic kidney disease (H[facility])   AF (paroxysmal atrial fibrillation) (H[facility])   Sick sinus syndrome ([facility])   Elevated serum free T4 level    Atrial fibrillation with RVR, paroxysmal, rate controlled, patient currently in atrial fibrillation Recommend continuing amiodarone therapy for rhythm management. Recommend continuing Xarelto therapy for stroke risk reduction. Continue diltiazem as an outpatient.  Diltiazem 60 mg tablet every 8 hours. Recommend follow-up with Dr. [name] on Thursday 09/10/20 as scheduled. Consider PPM placement as outpatient. The patient is cleared from a cardiac perspective for discharge home on today.   H/o symptomatic sick sinus syndrome, stable, patient's heart rate at this tim is 70's bpm Recommend monitoring heart rate closely at home and reporting any s/s of bradycardia to Cardiology office immediately. Recommend following fall precautions, stay adequately hydrated with water and electrolytes and slowly changing positions to avoid postural dizziness.    Hypertension, reasonably controlled, patient is normotensive at this time Recommend following the Dash diet and monitoring blood pressure daily at home with close documentation. Diltiazem will also contribute to better blood pressure control.   CKD stage IIIa, fairly stable Recommend staying adequately hydrated with water and electrolytes. Recommend avoiding all OTC nephrotoxic agents including NSAIDs.   Thyroid disease, fairly stable Agree with current management.   [place], ACNPC-AG  09/09/2020 10:02 AM

## 2020-09-09 NOTE — Telephone Encounter (Signed)
Transition Care Management Unsuccessful Follow-up Telephone Call  Date of discharge and from where:  09/09/20 from [facility]  Attempts:  1st Attempt  Reason for unsuccessful TCM follow-up call:  Left voice message

## 2020-09-09 NOTE — Discharge Summary (Signed)
Physician Discharge Summary  Frances Kent [id number] DOB: 03/07/1930 DOA: 09/07/2020  PCP: [name], MD  Admit date: 09/07/2020 Discharge date: 09/09/2020  Admitted From: home  Disposition:  home   Recommendations for Outpatient Follow-up:  Follow up with PCP in 1-2 weeks F/u w/ cardio, Dr. [name], on 09/10/20  Home Health: no  Equipment/Devices:  Discharge Condition: stable CODE STATUS: full  Diet recommendation: Heart Healthy  Brief/Interim Summary: HPI was taken from Dr. Damita Dunnings:  Frances Kent is a 87 y.o. female with medical history significant for Paroxysmal atrial fibrillation on amiodarone and Xarelto, sick sinus syndrome, hypothyroidism, anxiety and CKD stage IIIa, who presents to the emergency room with a complaint of palpitations and nausea.  She denied chest pain or shortness of breath.  Denied lightheadedness, visual disturbance or one-sided weakness numbness or tingling.  Chart review reveals that she saw her cardiologist on 6/28 and her metoprolol was discontinued due to symptomatic bradycardia in the 50s.  It was noted that consideration is being given to pacemaker placement in the future.   ED course: On arrival, heart rate on the monitor showed wide complex tachycardia at 158.  Vitals were otherwise within normal limits Troponin 21/18.  Free T4 2.15.  Creatinine 1.32 which is baseline, CBC and BMP and lipase, hepatic function panel, TSH mostly unremarkable   EKG, personally viewed and interpreted EKG #1: ?Wide-complex?  Narrow complex with aberrancy tachycardia at 158 EKG #3: S/p diltiazem, sinus tachycardia at 119   Imaging: Chest x-ray with stable cardiomegaly and chronic appearing increased interstitial lung markings without acute cardiopulmonary disease   Patient was given a dose of diltiazem 15 mg IV initially followed a couple hours later by an oral dose of metoprolol 12.5 mg.  Hospitalist consulted for admission.   Hospital course from Dr.  Jimmye Norman 09/09/20: Pt presented w/ PAF w/ RVR. Pt was recently taken off BB outpatient by cardio for symptomatic bradycardia. Pt was put on diltiazem, amiodarone & xarelto and these were continued at d/c as per cardio. Pt will f/u w/ cardio, Dr. [name] tomorrow 09/10/20. For more information, please see previous progress/consult notes for more information.   Discharge Diagnoses:  Principal Problem:   Tachyarrhythmia Active Problems:   Stage 3a chronic kidney disease (HCC)   AF (paroxysmal atrial fibrillation) (HCC)   Sick sinus syndrome (HCC)   Elevated serum free T4 level  PAF: presenting with tachyarrhythmia up to 158 improving following IV diltiazem, in the setting of recent discontinuation of metoprolol on 6/28 for symptomatic bradycardia by her cardiologist. Continue on amiodarone, diltiazem, xarelto. Continue on tele. Cardio recs apprec   Elevated serum free T4 level, with history of hypothyroidism: FT4 elevated at 2.15 but with normal TSH. Continue to hold thyroid replacement for now   CKDIIIa: Cr is trending down from day prior. Will continue to monitor   Discharge Instructions  Discharge Instructions     Diet - low sodium heart healthy   Complete by: As directed    Discharge instructions   Complete by: As directed    F/u w/ PCP w/in 1 week. F/u w/ cardio, Dr. [name], on 09/10/20   Increase activity slowly   Complete by: As directed       Allergies as of 09/09/2020       Reactions   Statins    Severe myalgias        Medication List     TAKE these medications    acetaminophen 500 MG tablet Commonly known as:  TYLENOL Take 500 mg by mouth every 6 (six) hours as needed.   ALPRAZolam 0.25 MG tablet Commonly known as: XANAX Take 1 tablet (0.25 mg total) by mouth at bedtime as needed for anxiety or sleep.   amiodarone 200 MG tablet Commonly known as: PACERONE Take 200 mg by mouth 2 (two) times daily.   AVASTIN IV 25 mg/mL by Intravitreal route. Every nine  weeks   diltiazem 60 MG tablet Commonly known as: CARDIZEM Take 1 tablet (60 mg total) by mouth every 8 (eight) hours.   levothyroxine 50 MCG tablet Commonly known as: Euthyrox TAKE 1 TABLET BY MOUTH ONCE DAILY 30  MINUTES  PRIOR  TO  EATING; Hold this medication until you see your PCP as TSH was within normal limits & FT4 was elevated What changed: See the new instructions.   MELATONIN GUMMIES PO Take 10 mg by mouth at bedtime as needed.   Xarelto 15 MG Tabs tablet Generic drug: Rivaroxaban TAKE 1 TABLET EVERY DAY        Follow-up Information     [name], MD Follow up in 1 week(s).   Specialty: Internal Medicine Why: F/u w/ PCP within 1 week Contact information: [place] [address] [phone]         [name], MD .   Specialty: Cardiology Contact information: [address] [facility] [place] [address] [phone]         [name], MD. Go to .   Specialty: Cardiology Why: as scheduled Contact information: [address] [facility] [place] [address] [phone]                Allergies  Allergen Reactions   Statins     Severe myalgias    Consultations: Cardio, Dr. Clayborn Bigness   Procedures/Studies: DG Chest Port 1 View  Result Date: 09/08/2020 CLINICAL DATA:  Weakness and tachycardia. EXAM: PORTABLE CHEST 1 VIEW COMPARISON:  Jul 12, 2020 FINDINGS: Mild, diffuse, chronic appearing increased interstitial lung markings are seen. There is no evidence of acute infiltrate, pleural effusion pneumothorax. Stable moderate severity enlargement of the cardiac silhouette is seen. Moderate severity calcification of the aortic arch is noted. The visualized skeletal structures are unremarkable. IMPRESSION: Stable cardiomegaly and chronic appearing increased interstitial lung markings without acute cardiopulmonary disease. Electronically  Signed   By: Virgina Norfolk M.D.   On: 09/08/2020 02:45   (Echo, Carotid, EGD, Colonoscopy, ERCP)    Subjective: pt denies any chest pain or shortness of breath    Discharge Exam: Vitals:   09/09/20 0516 09/09/20 0806  BP: 135/68 119/68  Pulse: 72 69  Resp: 16 16  Temp: 98.2 F (36.8 C) 97.9 F (36.6 C)  SpO2: 94% 95%   Vitals:   09/08/20 1943 09/09/20 0051 09/09/20 0516 09/09/20 0806  BP: 139/74 132/70 135/68 119/68  Pulse: 72 71 72 69  Resp: '18 19 16 16  '$ Temp: 98 F (36.7 C) 98 F (36.7 C) 98.2 F (36.8 C) 97.9 F (36.6 C)  TempSrc:      SpO2: 97% 95% 94% 95%  Weight:      Height:        General: Pt is alert, awake, not in acute distress Cardiovascular: S1/S2 +, no rubs, no gallops Respiratory: CTA bilaterally, no wheezing, no rhonchi Abdominal: Soft, NT, ND, bowel sounds + Extremities: no edema, no cyanosis    The results of significant diagnostics from this hospitalization (including  imaging, microbiology, ancillary and laboratory) are listed below for reference.     Microbiology: Recent Results (from the past 240 hour(s))  Resp Panel by RT-PCR (Flu A&B, Covid) Nasopharyngeal Swab     Status: None   Collection Time: 09/08/20  1:43 AM   Specimen: Nasopharyngeal Swab; Nasopharyngeal(NP) swabs in vial transport medium  Result Value Ref Range Status   SARS Coronavirus 2 by RT PCR NEGATIVE NEGATIVE Final    Comment: (NOTE) SARS-CoV-2 target nucleic acids are NOT DETECTED.  The SARS-CoV-2 RNA is generally detectable in upper respiratory specimens during the acute phase of infection. The lowest concentration of SARS-CoV-2 viral copies this assay can detect is 138 copies/mL. A negative result does not preclude SARS-Cov-2 infection and should not be used as the sole basis for treatment or other patient management decisions. A negative result may occur with  improper specimen collection/handling, submission of specimen other than nasopharyngeal swab,  presence of viral mutation(s) within the areas targeted by this assay, and inadequate number of viral copies(<138 copies/mL). A negative result must be combined with clinical observations, patient history, and epidemiological information. The expected result is Negative.  Fact Sheet for Patients:  EntrepreneurPulse.com.au  Fact Sheet for Healthcare Providers:  IncredibleEmployment.be  This test is no t yet approved or cleared by the [place] FDA and  has been authorized for detection and/or diagnosis of SARS-CoV-2 by FDA under an Emergency Use Authorization (EUA). This EUA will remain  in effect (meaning this test can be used) for the duration of the COVID-19 declaration under Section 564(b)(1) of the Act, 21 U.S.C.section 360bbb-3(b)(1), unless the authorization is terminated  or revoked sooner.       Influenza A by PCR NEGATIVE NEGATIVE Final   Influenza B by PCR NEGATIVE NEGATIVE Final    Comment: (NOTE) The Xpert Xpress SARS-CoV-2/FLU/RSV plus assay is intended as an aid in the diagnosis of influenza from Nasopharyngeal swab specimens and should not be used as a sole basis for treatment. Nasal washings and aspirates are unacceptable for Xpert Xpress SARS-CoV-2/FLU/RSV testing.  Fact Sheet for Patients: EntrepreneurPulse.com.au  Fact Sheet for Healthcare Providers: IncredibleEmployment.be  This test is not yet approved or cleared by the [place] FDA and has been authorized for detection and/or diagnosis of SARS-CoV-2 by FDA under an Emergency Use Authorization (EUA). This EUA will remain in effect (meaning this test can be used) for the duration of the COVID-19 declaration under Section 564(b)(1) of the Act, 21 U.S.C. section 360bbb-3(b)(1), unless the authorization is terminated or revoked.  Performed at [facility], [address] [place], [place] [address]       Labs: BNP (last 3 results) Recent Labs    03/27/20 1010 04/02/20 1021 07/12/20 1113  BNP 901.4* 2,094.8* [id number]*   Basic Metabolic Panel: Recent Labs  Lab 09/07/20 2134 09/08/20 0650 09/09/20 0849  NA 138 137 135  K 4.7 4.6 3.7  CL 104 107 104  CO2 '24 23 22  '$ GLUCOSE 130* 100* 95  BUN '13 14 13  '$ CREATININE 1.32* 1.16* 1.02*  CALCIUM 9.1 8.3* 8.3*  MG  --  2.1  --    Liver Function Tests: Recent Labs  Lab 09/07/20 2307  AST 40  ALT 31  ALKPHOS 106  BILITOT 0.8  PROT 6.4*  ALBUMIN 3.4*   Recent Labs  Lab 09/07/20 2307  LIPASE 37   No results for input(s): AMMONIA in the last 168 hours. CBC: Recent Labs  Lab 09/07/20 2134 09/09/20 0849  WBC  6.3 5.1  HGB 12.5 11.2*  HCT 39.6 34.2*  MCV 89.8 87.5  PLT 288 239   Cardiac Enzymes: No results for input(s): CKTOTAL, CKMB, CKMBINDEX, TROPONINI in the last 168 hours. BNP: Invalid input(s): POCBNP CBG: No results for input(s): GLUCAP in the last 168 hours. D-Dimer No results for input(s): DDIMER in the last 72 hours. Hgb A1c No results for input(s): HGBA1C in the last 72 hours. Lipid Profile No results for input(s): CHOL, HDL, LDLCALC, TRIG, CHOLHDL, LDLDIRECT in the last 72 hours. Thyroid function studies Recent Labs    09/07/20 2307  TSH 2.059   Anemia work up No results for input(s): VITAMINB12, FOLATE, FERRITIN, TIBC, IRON, RETICCTPCT in the last 72 hours. Urinalysis    Component Value Date/Time   COLORURINE YELLOW (A) 09/08/2020 0105   APPEARANCEUR HAZY (A) 09/08/2020 0105   APPEARANCEUR Clear 02/05/2013 1630   LABSPEC 1.024 09/08/2020 0105   LABSPEC 1.009 02/05/2013 1630   PHURINE 6.0 09/08/2020 0105   GLUCOSEU NEGATIVE 09/08/2020 0105   GLUCOSEU NEGATIVE 04/04/2018 1149   HGBUR NEGATIVE 09/08/2020 0105   BILIRUBINUR NEGATIVE 09/08/2020 0105   BILIRUBINUR 1 08/31/2017 1157   BILIRUBINUR Negative 02/05/2013 1630   KETONESUR 5 (A) 09/08/2020 0105   PROTEINUR 100 (A) 09/08/2020 0105    UROBILINOGEN 1.0 04/04/2018 1149   NITRITE NEGATIVE 09/08/2020 0105   LEUKOCYTESUR NEGATIVE 09/08/2020 0105   LEUKOCYTESUR 1+ 02/05/2013 1630   Sepsis Labs Invalid input(s): PROCALCITONIN,  WBC,  LACTICIDVEN Microbiology Recent Results (from the past 240 hour(s))  Resp Panel by RT-PCR (Flu A&B, Covid) Nasopharyngeal Swab     Status: None   Collection Time: 09/08/20  1:43 AM   Specimen: Nasopharyngeal Swab; Nasopharyngeal(NP) swabs in vial transport medium  Result Value Ref Range Status   SARS Coronavirus 2 by RT PCR NEGATIVE NEGATIVE Final    Comment: (NOTE) SARS-CoV-2 target nucleic acids are NOT DETECTED.  The SARS-CoV-2 RNA is generally detectable in upper respiratory specimens during the acute phase of infection. The lowest concentration of SARS-CoV-2 viral copies this assay can detect is 138 copies/mL. A negative result does not preclude SARS-Cov-2 infection and should not be used as the sole basis for treatment or other patient management decisions. A negative result may occur with  improper specimen collection/handling, submission of specimen other than nasopharyngeal swab, presence of viral mutation(s) within the areas targeted by this assay, and inadequate number of viral copies(<138 copies/mL). A negative result must be combined with clinical observations, patient history, and epidemiological information. The expected result is Negative.  Fact Sheet for Patients:  EntrepreneurPulse.com.au  Fact Sheet for Healthcare Providers:  IncredibleEmployment.be  This test is no t yet approved or cleared by the [place] FDA and  has been authorized for detection and/or diagnosis of SARS-CoV-2 by FDA under an Emergency Use Authorization (EUA). This EUA will remain  in effect (meaning this test can be used) for the duration of the COVID-19 declaration under Section 564(b)(1) of the Act, 21 U.S.C.section 360bbb-3(b)(1), unless the  authorization is terminated  or revoked sooner.       Influenza A by PCR NEGATIVE NEGATIVE Final   Influenza B by PCR NEGATIVE NEGATIVE Final    Comment: (NOTE) The Xpert Xpress SARS-CoV-2/FLU/RSV plus assay is intended as an aid in the diagnosis of influenza from Nasopharyngeal swab specimens and should not be used as a sole basis for treatment. Nasal washings and aspirates are unacceptable for Xpert Xpress SARS-CoV-2/FLU/RSV testing.  Fact Sheet for Patients: EntrepreneurPulse.com.au  Fact  Sheet for Healthcare Providers: IncredibleEmployment.be  This test is not yet approved or cleared by the [place] and has been authorized for detection and/or diagnosis of SARS-CoV-2 by FDA under an Emergency Use Authorization (EUA). This EUA will remain in effect (meaning this test can be used) for the duration of the COVID-19 declaration under Section 564(b)(1) of the Act, 21 U.S.C. section 360bbb-3(b)(1), unless the authorization is terminated or revoked.  Performed at [facility], [address], [place], [place] [address]      Time coordinating discharge: Over 30 minutes  SIGNED:   Wyvonnia Dusky, MD  [facility] 09/09/2020, 11:45 AM Pager   If 7PM-7AM, please contact night-coverage [email or web address]

## 2020-09-09 NOTE — Telephone Encounter (Signed)
Transition Care Management Unsuccessful Follow-up Telephone Call  Date of discharge and from where:  09/09/20 from [facility]  Attempts:  2nd Attempt  Reason for unsuccessful TCM follow-up call:  Unable to reach patient

## 2020-09-10 ENCOUNTER — Telehealth: Payer: Self-pay

## 2020-09-10 DIAGNOSIS — I4892 Unspecified atrial flutter: Secondary | ICD-10-CM | POA: Diagnosis not present

## 2020-09-10 DIAGNOSIS — I480 Paroxysmal atrial fibrillation: Secondary | ICD-10-CM | POA: Diagnosis not present

## 2020-09-10 NOTE — Telephone Encounter (Signed)
Frances Kent, TCM call completed. No appt slots available with Dr. Derrel Nip within the 14 day time frame to meet TCM requirements. Denisa advised to reach out to you to schedule if no appt's available.  Thanks

## 2020-09-10 NOTE — Telephone Encounter (Signed)
Transition Care Management Follow-up Telephone Call Date of discharge and from where: 09/09/20-[facility] How have you been since you were released from the hospital? Doing ok but feeling very tired. Any questions or concerns? No  Items Reviewed: Did the pt receive and understand the discharge instructions provided? Yes  Medications obtained and verified? Yes  Other? Yes  Any new allergies since your discharge? No  Dietary orders reviewed? Yes Do you have support at home? Yes   Home Care and Equipment/Supplies: Were home health services ordered? no If so, what is the name of the agency? N/a  Has the agency set up a time to come to the patient's home? not applicable Were any new equipment or medical supplies ordered?  No What is the name of the medical supply agency? N/a Were you able to get the supplies/equipment? not applicable Do you have any questions related to the use of the equipment or supplies? N/a  Functional Questionnaire: (I = Independent and D = Dependent) ADLs: I  Bathing/Dressing- I  Meal Prep- I  Eating- I  Maintaining continence- I  Transferring/Ambulation- I  Managing Meds- I  Follow up appointments reviewed:  PCP Hospital f/u appt confirmed? No  Not yet-no appt slots available-message sent to Kerin Salen to schedule [facility] f/u appt confirmed? No  Scheduled to see Dr. [name] on 09/10/20 @ 2:30. Are transportation arrangements needed? No  If their condition worsens, is the pt aware to call PCP or go to the Emergency Dept.? Yes Was the patient provided with contact information for the PCP's office or ED? Yes Was to pt encouraged to call back with questions or concerns? Yes

## 2020-09-11 ENCOUNTER — Encounter: Payer: Self-pay | Admitting: Internal Medicine

## 2020-09-11 ENCOUNTER — Ambulatory Visit (INDEPENDENT_AMBULATORY_CARE_PROVIDER_SITE_OTHER): Payer: Medicare HMO | Admitting: Internal Medicine

## 2020-09-11 ENCOUNTER — Ambulatory Visit: Payer: Medicare HMO

## 2020-09-11 ENCOUNTER — Other Ambulatory Visit: Payer: Self-pay

## 2020-09-11 ENCOUNTER — Telehealth: Payer: Self-pay

## 2020-09-11 DIAGNOSIS — Z8679 Personal history of other diseases of the circulatory system: Secondary | ICD-10-CM

## 2020-09-11 DIAGNOSIS — Z09 Encounter for follow-up examination after completed treatment for conditions other than malignant neoplasm: Secondary | ICD-10-CM | POA: Diagnosis not present

## 2020-09-11 MED ORDER — LEVOTHYROXINE SODIUM 50 MCG PO TABS: ORAL_TABLET | ORAL | 0 refills | Status: DC

## 2020-09-11 NOTE — Progress Notes (Signed)
Subjective:  Patient ID: Frances Kent, female    DOB: 01/21/1931  Age: 88 y.o. MRN: [id number]  CC: The encounter diagnosis was Hospital discharge follow-up.  HPI Frances Kent presents for hospital follow up   This visit occurred during the SARS-CoV-2 public health emergency.  Safety protocols were in place, including screening questions prior to the visit, additional usage of staff PPE, and extensive cleaning of exam room while observing appropriate contact time as indicated for disinfecting solutions.    Frances Kent is an 87 yr old female with atrial fibrillation managed with amiodarone and metoprolol She was admitted to [facility] on July 25 with palpitations and nausea secondary to wide complex tachycardia rate 128 with  mildly elevated troponin.  The tachyardia occurred  In the setting of recent discontiuation of beta blocker by her cardiologist due to bradycardia, symptomatic.     Patient was given a dose of diltiazem 15 mg IV initially followed a couple hours later by an oral dose of metoprolol 12.5 mg.  heart rate was brought under control and she was discharged on diltiazem, amiodarone twice daily & xarelto as per cardiology consult.  . She has followed up with her cardiologist Dr. [name]  09/10/20. And amiodarone dose was reduced to 200 mg once daily  .   EP consultation has been ordered for possible ablation with or without pacemaker placement.  LEVOTHYROXINE WAS HELD AT DISCHARGE FOR UNCLEAR REASONS.  TSH  was normal (see below )but T4 was mildly elevated.    She was discharged home on Wednesday., July 27   She continues to have  rhinitis, with clear discharge,  and a hacking cough which started on July 23, prior to admission .  Her COVID test was negative on Monday at [facility]  .  She feels generally better than she did at admission.  Her nausea has resolved.   Outpatient Medications Prior to Visit  Medication Sig Dispense Refill   acetaminophen (TYLENOL) 500 MG tablet Take 500 mg by  mouth every 6 (six) hours as needed.     ALPRAZolam (XANAX) 0.25 MG tablet Take 1 tablet (0.25 mg total) by mouth at bedtime as needed for anxiety or sleep. 20 tablet 3   amiodarone (PACERONE) 200 MG tablet Take 200 mg by mouth 2 (two) times daily.     Bevacizumab (AVASTIN IV) 25 mg/mL by Intravitreal route. Every nine weeks     diltiazem (CARDIZEM) 60 MG tablet Take 1 tablet (60 mg total) by mouth every 8 (eight) hours. 90 tablet 0   MELATONIN GUMMIES PO Take 10 mg by mouth at bedtime as needed.     XARELTO 15 MG TABS tablet TAKE 1 TABLET EVERY DAY 60 tablet 1   levothyroxine (EUTHYROX) 50 MCG tablet TAKE 1 TABLET BY MOUTH ONCE DAILY 30  MINUTES  PRIOR  TO  EATING; Hold this medication until you see your PCP as TSH was within normal limits & FT4 was elevated (Patient not taking: Reported on 09/11/2020) 90 tablet 0   No facility-administered medications prior to visit.    Review of Systems;  Patient denies headache, fevers, malaise, unintentional weight loss, skin rash, eye pain, sinus congestion and sinus pain, sore throat, dysphagia,  hemoptysis , , dyspnea, wheezing, chest pain, palpitations, orthopnea, edema, abdominal pain, nausea, melena, diarrhea, constipation, flank pain, dysuria, hematuria, urinary  Frequency, nocturia, numbness, tingling, seizures,  Focal weakness, Loss of consciousness,  Tremor, insomnia, depression, anxiety, and suicidal ideation.      Objective:  BP (!) 148/58 (BP Location: Left Arm, Patient Position: Sitting)   Pulse 76   Temp (!) 96.9 F (36.1 C)   Ht 5' 2.99" (1.6 m)   Wt 107 lb (48.5 kg)   SpO2 94%   BMI 18.96 kg/m   BP Readings from Last 3 Encounters:  09/11/20 (!) 148/58  09/09/20 132/63  08/26/20 (!) 178/73    Wt Readings from Last 3 Encounters:  09/11/20 107 lb (48.5 kg)  09/07/20 103 lb (46.7 kg)  08/26/20 104 lb 12.8 oz (47.5 kg)    General appearance: alert, cooperative and appears stated age Ears: normal TM's and external ear canals  both ears Throat: lips, mucosa, and tongue normal; teeth and gums normal Neck: no adenopathy, no carotid bruit, supple, symmetrical, trachea midline and thyroid not enlarged, symmetric, no tenderness/mass/nodules Back: symmetric, no curvature. ROM normal. No CVA tenderness. Lungs: clear to auscultation bilaterally Heart: regular rate and rhythm, S1, S2 normal, no murmur, click, rub or gallop Abdomen: soft, non-tender; bowel sounds normal; no masses,  no organomegaly Pulses: 2+ and symmetric Skin: Skin color, texture, turgor normal. No rashes or lesions Lymph nodes: Cervical, supraclavicular, and axillary nodes normal.  No results found for: HGBA1C  Lab Results  Component Value Date   CREATININE 1.02 (H) 09/09/2020   CREATININE 1.16 (H) 09/08/2020   CREATININE 1.32 (H) 09/07/2020    Lab Results  Component Value Date   WBC 5.1 09/09/2020   HGB 11.2 (L) 09/09/2020   HCT 34.2 (L) 09/09/2020   PLT 239 09/09/2020   GLUCOSE 95 09/09/2020   CHOL 284 (H) 12/11/2019   TRIG 91.0 12/11/2019   HDL 100.10 12/11/2019   LDLDIRECT 185.3 03/27/2013   LDLCALC 166 (H) 12/11/2019   ALT 31 09/07/2020   AST 40 09/07/2020   NA 135 09/09/2020   K 3.7 09/09/2020   CL 104 09/09/2020   CREATININE 1.02 (H) 09/09/2020   BUN 13 09/09/2020   CO2 22 09/09/2020   TSH 2.059 09/07/2020   INR 1.03 03/04/2016    DG Chest Port 1 View  Result Date: 09/08/2020 CLINICAL DATA:  Weakness and tachycardia. EXAM: PORTABLE CHEST 1 VIEW COMPARISON:  Jul 12, 2020 FINDINGS: Mild, diffuse, chronic appearing increased interstitial lung markings are seen. There is no evidence of acute infiltrate, pleural effusion pneumothorax. Stable moderate severity enlargement of the cardiac silhouette is seen. Moderate severity calcification of the aortic arch is noted. The visualized skeletal structures are unremarkable. IMPRESSION: Stable cardiomegaly and chronic appearing increased interstitial lung markings without acute  cardiopulmonary disease. Electronically Signed   By: Virgina Norfolk M.D.   On: 09/08/2020 02:45    Assessment & Plan:   Problem List Items Addressed This Visit       [facility] discharge follow-up    Elmo was admitted to [facility] with wide complex tachycardia on July 25 which was treated with IV diltiazem to resolution. All images, labs and medication changes were reviewed with her today.  addiitonal changes made by her cardiologist post discharge have also been reviewed.  She has been advised to resume levothyroxine as her TSH was therapeutic         Follow-up: No follow-ups on file.   [name], MD

## 2020-09-11 NOTE — Telephone Encounter (Signed)
Pt's daughter [name] called and needed to schedule HFU for pt asap. She states that they temporarily took pt off of thyroid medication. They put her on diltiazem. Please call [name] [phone] to schedule.

## 2020-09-11 NOTE — Patient Instructions (Signed)
You can resume taking your levothyroxine pill.  It is the right dose for you  For your  runny nose you can  use    generic Allegra , available generically as fexofenadine  OR Generic Claritin :  also available as loratidine .    If you still need something for cough,  try Robitussin

## 2020-09-11 NOTE — Telephone Encounter (Signed)
Pt is scheduled for an appt today.

## 2020-09-13 NOTE — Assessment & Plan Note (Signed)
Frances Kent was admitted to [facility] with wide complex tachycardia on July 25 which was treated with IV diltiazem to resolution. All images, labs and medication changes were reviewed with her today.  addiitonal changes made by her cardiologist post discharge have also been reviewed.  She has been advised to resume levothyroxine as her TSH was therapeutic

## 2020-09-18 ENCOUNTER — Ambulatory Visit: Payer: Medicare HMO

## 2020-09-22 ENCOUNTER — Ambulatory Visit (INDEPENDENT_AMBULATORY_CARE_PROVIDER_SITE_OTHER): Payer: Medicare HMO | Admitting: Internal Medicine

## 2020-09-22 ENCOUNTER — Encounter: Payer: Self-pay | Admitting: Internal Medicine

## 2020-09-22 ENCOUNTER — Other Ambulatory Visit: Payer: Self-pay

## 2020-09-22 ENCOUNTER — Telehealth: Payer: Self-pay | Admitting: Internal Medicine

## 2020-09-22 VITALS — BP 138/56 | HR 61 | Temp 96.0°F | Resp 14 | Ht 62.0 in | Wt 100.2 lb

## 2020-09-22 DIAGNOSIS — R918 Other nonspecific abnormal finding of lung field: Secondary | ICD-10-CM | POA: Diagnosis not present

## 2020-09-22 DIAGNOSIS — J849 Interstitial pulmonary disease, unspecified: Secondary | ICD-10-CM | POA: Insufficient documentation

## 2020-09-22 DIAGNOSIS — R059 Cough, unspecified: Secondary | ICD-10-CM

## 2020-09-22 MED ORDER — ESOMEPRAZOLE MAGNESIUM 40 MG PO CPDR: 40.0000 mg | DELAYED_RELEASE_CAPSULE | Freq: Every day | ORAL | 3 refills | Status: DC

## 2020-09-22 MED ORDER — BENZONATATE 200 MG PO CAPS: 200.0000 mg | ORAL_CAPSULE | Freq: Three times a day (TID) | ORAL | 1 refills | Status: DC | PRN

## 2020-09-22 NOTE — Progress Notes (Signed)
Subjective:  Patient ID: Frances Kent, female    DOB: 1930/07/18  Age: 87 y.o. MRN: [id number]  CC: The primary encounter diagnosis was Cough. Diagnoses of Lung field abnormal finding on examination and ILD (interstitial lung disease) ([place]) were also pertinent to this visit.  HPI Frances Kent presents for  persistent productive cough that has been present for 3 weeks.  And 2) recurrent fatigue with hypotension   This visit occurred during the SARS-CoV-2 public health emergency.  Safety protocols were in place, including screening questions prior to the visit, additional usage of staff PPE, and extensive cleaning of exam room while observing appropriate contact time as indicated for disinfecting solutions.   Seen on July 29 for hospital follow up .  Since her hospitalization she has had a persistent productive Cough described as moist  but not enough to expectorate.  No fevers, pleurisy or shortness of breath. Denies wheezing . Not worse at night,  not worse after eating   Previous films reviewed:  coarse markings of  interstitial lung disease noted on July 28 chest film.  Small bilateral pleural effusions noted on CT angio done to follow up on pulmonary embolism diagnosis . HAS HISTORY OF SEVERE GERD,  has not been taking PPI    Persistent fatigue:  taking amiodarone and diltiazem .  Home readings of blood pressure have dropped into the 90's.   Too tired to grocery shop at times.  Doesn't want PT>  doing house work and chores,  goes up and down stairs,   not short of breath  Outpatient Medications Prior to Visit  Medication Sig Dispense Refill   acetaminophen (TYLENOL) 500 MG tablet Take 500 mg by mouth every 6 (six) hours as needed.     ALPRAZolam (XANAX) 0.25 MG tablet Take 1 tablet (0.25 mg total) by mouth at bedtime as needed for anxiety or sleep. 20 tablet 3   amiodarone (PACERONE) 200 MG tablet Take 200 mg by mouth 2 (two) times daily.     Bevacizumab (AVASTIN IV) 25 mg/mL by  Intravitreal route. Every nine weeks     diltiazem (CARDIZEM) 60 MG tablet Take 1 tablet (60 mg total) by mouth every 8 (eight) hours. 90 tablet 0   levothyroxine (EUTHYROX) 50 MCG tablet TAKE 1 TABLET BY MOUTH ONCE DAILY 30  MINUTES  PRIOR  TO  EATING; 90 tablet 0   MELATONIN GUMMIES PO Take 10 mg by mouth at bedtime as needed.     XARELTO 15 MG TABS tablet TAKE 1 TABLET EVERY DAY 60 tablet 1   No facility-administered medications prior to visit.    Review of Systems;  Patient denies headache, fevers, malaise, unintentional weight loss, skin rash, eye pain, sinus congestion and sinus pain, sore throat, dysphagia,  hemoptysis , cough, dyspnea, wheezing, chest pain, palpitations, orthopnea, edema, abdominal pain, nausea, melena, diarrhea, constipation, flank pain, dysuria, hematuria, urinary  Frequency, nocturia, numbness, tingling, seizures,  Focal weakness, Loss of consciousness,  Tremor, insomnia, depression, anxiety, and suicidal ideation.      Objective:  BP (!) 138/56 (BP Location: Left Arm, Patient Position: Sitting, Cuff Size: Normal)   Pulse 61   Temp (!) 96 F (35.6 C) (Temporal)   Resp 14   Ht '5\' 2"'$  (1.575 m)   Wt 100 lb 3.2 oz (45.5 kg)   SpO2 95%   BMI 18.33 kg/m   BP Readings from Last 3 Encounters:  09/22/20 (!) 138/56  09/11/20 (!) 148/58  09/09/20 132/63  Wt Readings from Last 3 Encounters:  09/22/20 100 lb 3.2 oz (45.5 kg)  09/11/20 107 lb (48.5 kg)  09/07/20 103 lb (46.7 kg)    General appearance: alert, cooperative and appears stated age Ears: normal TM's and external ear canals both ears Throat: lips, mucosa, and tongue normal; teeth and gums normal Neck: no adenopathy, no carotid bruit, supple, symmetrical, trachea midline and thyroid not enlarged, symmetric, no tenderness/mass/nodules Back: symmetric, no curvature. ROM normal. No CVA tenderness. Lungs: BILATERAL inspiratory squeaks and crackles  Heart: regular rate and rhythm, S1, S2 normal, no  murmur, click, rub or gallop Abdomen: soft, non-tender; bowel sounds normal; no masses,  no organomegaly Pulses: 2+ and symmetric Skin: Skin color, texture, turgor normal. No rashes or lesions Lymph nodes: Cervical, supraclavicular, and axillary nodes normal.  No results found for: HGBA1C  Lab Results  Component Value Date   CREATININE 1.02 (H) 09/09/2020   CREATININE 1.16 (H) 09/08/2020   CREATININE 1.32 (H) 09/07/2020    Lab Results  Component Value Date   WBC 5.1 09/09/2020   HGB 11.2 (L) 09/09/2020   HCT 34.2 (L) 09/09/2020   PLT 239 09/09/2020   GLUCOSE 95 09/09/2020   CHOL 284 (H) 12/11/2019   TRIG 91.0 12/11/2019   HDL 100.10 12/11/2019   LDLDIRECT 185.3 03/27/2013   LDLCALC 166 (H) 12/11/2019   ALT 31 09/07/2020   AST 40 09/07/2020   NA 135 09/09/2020   K 3.7 09/09/2020   CL 104 09/09/2020   CREATININE 1.02 (H) 09/09/2020   BUN 13 09/09/2020   CO2 22 09/09/2020   TSH 2.059 09/07/2020   INR 1.03 03/04/2016    DG Chest Port 1 View  Result Date: 09/08/2020 CLINICAL DATA:  Weakness and tachycardia. EXAM: PORTABLE CHEST 1 VIEW COMPARISON:  Jul 12, 2020 FINDINGS: Mild, diffuse, chronic appearing increased interstitial lung markings are seen. There is no evidence of acute infiltrate, pleural effusion pneumothorax. Stable moderate severity enlargement of the cardiac silhouette is seen. Moderate severity calcification of the aortic arch is noted. The visualized skeletal structures are unremarkable. IMPRESSION: Stable cardiomegaly and chronic appearing increased interstitial lung markings without acute cardiopulmonary disease. Electronically Signed   By: Virgina Norfolk M.D.   On: 09/08/2020 02:45    Assessment & Plan:   Problem List Items Addressed This Visit       Unprioritized   Cough - Primary    May be multifactorial.  Resume PPI for GERD,  Adding tussionex for cough suppression,  CT his to rule out pulmonary fibrosis and bronchiectasis        Relevant  Orders   CT Chest High Resolution   ILD (interstitial lung disease) (HCC)    Exam , history and chest x ray worrisome for bronchiectasis and pulmonary fibrosis.  High resolution CT ordered        Relevant Orders   CT Chest High Resolution   Other Visit Diagnoses     Lung field abnormal finding on examination       Relevant Orders   CT Chest High Resolution       I provided  30 minutes  during this encounter reviewing patient's current problems and past surgeries, labs , prior  imaging studies, providing counseling on the above mentioned problems I na face to face visit  , and coordination  of care .  Meds ordered this encounter  Medications   benzonatate (TESSALON) 200 MG capsule    Sig: Take 1 capsule (200 mg total) by mouth  3 (three) times daily as needed for cough.    Dispense:  60 capsule    Refill:  1   esomeprazole (NEXIUM) 40 MG capsule    Sig: Take 1 capsule (40 mg total) by mouth daily.    Dispense:  30 capsule    Refill:  3    There are no discontinued medications.  Follow-up: Return in about 4 weeks (around 10/20/2020).   [name], MD

## 2020-09-22 NOTE — Assessment & Plan Note (Signed)
Exam , history and chest x ray worrisome for bronchiectasis and pulmonary fibrosis.  High resolution CT ordered

## 2020-09-22 NOTE — Telephone Encounter (Signed)
Lft pt vm to call ofc to sch CT. thanks 

## 2020-09-22 NOTE — Patient Instructions (Addendum)
Sometimes a cough is due to multiple causes at one time:  1) we need to treat your reflux again with once daily Nexium  2) I want to look at your lungs with a CT scan to get more detail on the scarring that was noticed on your last chest x ray.  This test has been ordered  3) you can try the cough capsules I have sent to your pharmacy.  They will suppress the cough but not cure it.       For the fatigue:  Try cutting the diltiazem tablets in half and taking only half the dose (30 mg total) /.  This will help keep your blood pressure and heart rated from dropping too low.

## 2020-09-22 NOTE — Assessment & Plan Note (Signed)
May be multifactorial.  Resume PPI for GERD,  Adding tussionex for cough suppression,  CT his to rule out pulmonary fibrosis and bronchiectasis

## 2020-09-30 ENCOUNTER — Telehealth: Payer: Self-pay

## 2020-09-30 NOTE — Telephone Encounter (Signed)
Pt's daughter called and states that her blood pressure has been low for three days. She wants to know if it may be due to some medication that she is on. She is not with patient so I could not transfer to triage. Please call daughter [name] [phone].

## 2020-10-01 NOTE — Telephone Encounter (Signed)
LMTCB

## 2020-10-05 ENCOUNTER — Ambulatory Visit
Admission: RE | Admit: 2020-10-05 | Discharge: 2020-10-05 | Disposition: A | Discharge status: Home / Self Care | Payer: Medicare HMO | Source: Ambulatory Visit | Attending: Internal Medicine | Admitting: Internal Medicine

## 2020-10-05 ENCOUNTER — Other Ambulatory Visit: Payer: Self-pay

## 2020-10-05 DIAGNOSIS — R059 Cough, unspecified: Secondary | ICD-10-CM | POA: Diagnosis not present

## 2020-10-05 DIAGNOSIS — R918 Other nonspecific abnormal finding of lung field: Secondary | ICD-10-CM | POA: Diagnosis not present

## 2020-10-05 DIAGNOSIS — J479 Bronchiectasis, uncomplicated: Secondary | ICD-10-CM | POA: Diagnosis not present

## 2020-10-05 DIAGNOSIS — J849 Interstitial pulmonary disease, unspecified: Secondary | ICD-10-CM | POA: Insufficient documentation

## 2020-10-05 DIAGNOSIS — I700 Atherosclerosis of aorta: Secondary | ICD-10-CM | POA: Diagnosis not present

## 2020-10-06 DIAGNOSIS — I071 Rheumatic tricuspid insufficiency: Secondary | ICD-10-CM | POA: Diagnosis not present

## 2020-10-06 DIAGNOSIS — I5020 Unspecified systolic (congestive) heart failure: Secondary | ICD-10-CM | POA: Diagnosis not present

## 2020-10-06 DIAGNOSIS — I050 Rheumatic mitral stenosis: Secondary | ICD-10-CM | POA: Diagnosis not present

## 2020-10-06 DIAGNOSIS — I272 Pulmonary hypertension, unspecified: Secondary | ICD-10-CM | POA: Diagnosis not present

## 2020-10-06 DIAGNOSIS — I341 Nonrheumatic mitral (valve) prolapse: Secondary | ICD-10-CM | POA: Diagnosis not present

## 2020-10-06 DIAGNOSIS — R001 Bradycardia, unspecified: Secondary | ICD-10-CM | POA: Diagnosis not present

## 2020-10-06 DIAGNOSIS — I480 Paroxysmal atrial fibrillation: Secondary | ICD-10-CM | POA: Diagnosis not present

## 2020-10-06 DIAGNOSIS — I4892 Unspecified atrial flutter: Secondary | ICD-10-CM | POA: Diagnosis not present

## 2020-10-06 DIAGNOSIS — I10 Essential (primary) hypertension: Secondary | ICD-10-CM | POA: Diagnosis not present

## 2020-10-06 NOTE — Telephone Encounter (Signed)
Pt's daughter called back and she stated that this morning the pt's blood pressure was a little high. She also stated that she pt did not sleep well last night due to having difficulty breathing. Asked the daughter if pt had a way to check o2 level and she stated that her o2 level is always fine. Daughter is requesting results of CT scan. She also stated that she is going to call cardiology to see about getting her appt moved up sooner than October. Daughter stated that she would give [place] a call back to let us know if she could get appt moved up or not.

## 2020-10-06 NOTE — Telephone Encounter (Signed)
LMTCB

## 2020-10-07 ENCOUNTER — Telehealth: Payer: Self-pay | Admitting: Internal Medicine

## 2020-10-07 ENCOUNTER — Other Ambulatory Visit: Payer: Self-pay | Admitting: Internal Medicine

## 2020-10-07 MED ORDER — AMOXICILLIN-POT CLAVULANATE 875-125 MG PO TABS: 1.0000 | ORAL_TABLET | Freq: Two times a day (BID) | ORAL | 0 refills | Status: DC

## 2020-10-07 NOTE — Telephone Encounter (Signed)
Patient stated her daughter received the results and is on her way to pick up medication. Appointment has been scheduled for next Thursday in office @ 1330 on [id number]

## 2020-10-07 NOTE — Telephone Encounter (Signed)
[name] calling in for CT scan results. Results are now available in epic.   Please advise

## 2020-10-15 ENCOUNTER — Encounter: Payer: Self-pay | Admitting: Emergency Medicine

## 2020-10-15 ENCOUNTER — Encounter: Payer: Self-pay | Admitting: Internal Medicine

## 2020-10-15 ENCOUNTER — Emergency Department: Payer: Medicare HMO

## 2020-10-15 ENCOUNTER — Ambulatory Visit (INDEPENDENT_AMBULATORY_CARE_PROVIDER_SITE_OTHER): Payer: Medicare HMO | Admitting: Internal Medicine

## 2020-10-15 ENCOUNTER — Observation Stay
Admission: EM | Admit: 2020-10-15 | Discharge: 2020-10-16 | Disposition: A | Discharge status: Home / Self Care | Payer: Medicare HMO | Attending: Internal Medicine | Admitting: Internal Medicine

## 2020-10-15 ENCOUNTER — Other Ambulatory Visit: Payer: Self-pay

## 2020-10-15 VITALS — BP 118/82 | HR 137 | Temp 95.6°F | Ht 62.0 in | Wt 98.8 lb

## 2020-10-15 DIAGNOSIS — J189 Pneumonia, unspecified organism: Secondary | ICD-10-CM | POA: Diagnosis not present

## 2020-10-15 DIAGNOSIS — E039 Hypothyroidism, unspecified: Secondary | ICD-10-CM | POA: Insufficient documentation

## 2020-10-15 DIAGNOSIS — I509 Heart failure, unspecified: Secondary | ICD-10-CM | POA: Diagnosis not present

## 2020-10-15 DIAGNOSIS — I4891 Unspecified atrial fibrillation: Principal | ICD-10-CM | POA: Insufficient documentation

## 2020-10-15 DIAGNOSIS — T3695XA Adverse effect of unspecified systemic antibiotic, initial encounter: Secondary | ICD-10-CM | POA: Diagnosis not present

## 2020-10-15 DIAGNOSIS — Z7901 Long term (current) use of anticoagulants: Secondary | ICD-10-CM | POA: Insufficient documentation

## 2020-10-15 DIAGNOSIS — R0602 Shortness of breath: Secondary | ICD-10-CM | POA: Diagnosis present

## 2020-10-15 DIAGNOSIS — R197 Diarrhea, unspecified: Secondary | ICD-10-CM | POA: Insufficient documentation

## 2020-10-15 DIAGNOSIS — Z79899 Other long term (current) drug therapy: Secondary | ICD-10-CM | POA: Diagnosis not present

## 2020-10-15 DIAGNOSIS — N1831 Chronic kidney disease, stage 3a: Secondary | ICD-10-CM | POA: Diagnosis not present

## 2020-10-15 DIAGNOSIS — I130 Hypertensive heart and chronic kidney disease with heart failure and stage 1 through stage 4 chronic kidney disease, or unspecified chronic kidney disease: Secondary | ICD-10-CM | POA: Diagnosis not present

## 2020-10-15 DIAGNOSIS — R000 Tachycardia, unspecified: Secondary | ICD-10-CM | POA: Diagnosis not present

## 2020-10-15 DIAGNOSIS — D692 Other nonthrombocytopenic purpura: Secondary | ICD-10-CM | POA: Diagnosis not present

## 2020-10-15 DIAGNOSIS — K521 Toxic gastroenteritis and colitis: Secondary | ICD-10-CM | POA: Diagnosis not present

## 2020-10-15 DIAGNOSIS — I639 Cerebral infarction, unspecified: Secondary | ICD-10-CM

## 2020-10-15 DIAGNOSIS — Z20822 Contact with and (suspected) exposure to covid-19: Secondary | ICD-10-CM | POA: Diagnosis not present

## 2020-10-15 DIAGNOSIS — I482 Chronic atrial fibrillation, unspecified: Secondary | ICD-10-CM | POA: Diagnosis present

## 2020-10-15 HISTORY — DX: Cerebral infarction, unspecified: I63.9

## 2020-10-15 LAB — BASIC METABOLIC PANEL
Anion gap: 7 (ref 5–15)
BUN: 17 mg/dL (ref 8–23)
CO2: 25 mmol/L (ref 22–32)
Calcium: 8.7 mg/dL — ABNORMAL LOW (ref 8.9–10.3)
Chloride: 103 mmol/L (ref 98–111)
Creatinine, Ser: 1.03 mg/dL — ABNORMAL HIGH (ref 0.44–1.00)
GFR, Estimated: 52 mL/min — ABNORMAL LOW (ref >60)
Glucose, Bld: 135 mg/dL — ABNORMAL HIGH (ref 70–99)
Potassium: 4.6 mmol/L (ref 3.5–5.1)
Sodium: 135 mmol/L (ref 135–145)

## 2020-10-15 LAB — PROTIME-INR
INR: 3 — ABNORMAL HIGH (ref 0.8–1.2)
Prothrombin Time: 31.1 seconds — ABNORMAL HIGH (ref 11.4–15.2)

## 2020-10-15 LAB — CBC
HCT: 36 % (ref 36.0–46.0)
Hemoglobin: 11.7 g/dL — ABNORMAL LOW (ref 12.0–15.0)
MCH: 28.7 pg (ref 26.0–34.0)
MCHC: 32.5 g/dL (ref 30.0–36.0)
MCV: 88.2 fL (ref 80.0–100.0)
Platelets: 319 10*3/uL (ref 150–400)
RBC: 4.08 MIL/uL (ref 3.87–5.11)
RDW: 18.3 % — ABNORMAL HIGH (ref 11.5–15.5)
WBC: 7.6 10*3/uL (ref 4.0–10.5)
nRBC: 0 % (ref 0.0–0.2)

## 2020-10-15 LAB — PROCALCITONIN: Procalcitonin: 0.17 ng/mL

## 2020-10-15 LAB — BRAIN NATRIURETIC PEPTIDE: B Natriuretic Peptide: 954.5 pg/mL — ABNORMAL HIGH (ref 0.0–100.0)

## 2020-10-15 LAB — RESP PANEL BY RT-PCR (FLU A&B, COVID) ARPGX2
Influenza A by PCR: NEGATIVE
Influenza B by PCR: NEGATIVE
SARS Coronavirus 2 by RT PCR: NEGATIVE

## 2020-10-15 LAB — TSH: TSH: 1.326 u[IU]/mL (ref 0.350–4.500)

## 2020-10-15 LAB — MAGNESIUM: Magnesium: 2.4 mg/dL (ref 1.7–2.4)

## 2020-10-15 LAB — TROPONIN I (HIGH SENSITIVITY): Troponin I (High Sensitivity): 18 ng/L — ABNORMAL HIGH (ref <18)

## 2020-10-15 MED ORDER — DILTIAZEM LOAD VIA INFUSION
10.0000 mg | Freq: Once | INTRAVENOUS | Status: AC
  Administered 2020-10-15: 10 mg via INTRAVENOUS
  Filled 2020-10-15: qty 10

## 2020-10-15 MED ORDER — DILTIAZEM HCL-DEXTROSE 125-5 MG/125ML-% IV SOLN (PREMIX)
5.0000 mg/h | INTRAVENOUS | Status: DC
Start: 2020-10-15 — End: 2020-10-16
  Administered 2020-10-15: 5 mg/h via INTRAVENOUS
  Filled 2020-10-15: qty 125

## 2020-10-15 MED ORDER — LACTATED RINGERS IV BOLUS
500.0000 mL | Freq: Once | INTRAVENOUS | Status: AC
  Administered 2020-10-15: 500 mL via INTRAVENOUS

## 2020-10-15 NOTE — Assessment & Plan Note (Signed)
In the setting of recent use of Augmentin (fiished Aug 31) for pneumonia, despite use of probiotics.  c dif suspected.  Sending to ER given her dehydrated state and rapid atrial fib

## 2020-10-15 NOTE — ED Provider Notes (Signed)
[facility] Emergency Department Provider Note  ____________________________________________   Event Date/Time   First MD Initiated Contact with Patient 10/15/20 2156     (approximate)  I have reviewed the triage vital signs and the nursing notes.   HISTORY  Chief Complaint Atrial Fibrillation and Shortness of Breath   HPI Frances Kent is a 87 y.o. female with a past medical history of GERD, HTN, hypothyroidism, mitral valve regurgitation, PE, SVT and A. fib on diltiazem and amiodarone anticoagulated on Xarelto as well as sick sinus syndrome who presents for assessment after being referred to the ED from PCP for further management of concerns for uncontrolled A. fib causing some worsening shortness of breath fatigue and palpitations.  It seems patient has been struggling with a rate for at least several weeks to months and I have abdominal to find a good dose of diltiazem that does not drop her heart rate too much.  She does not have an appointment with EP until October.  She does note that she recently completed a course of amoxicillin for bacterial pneumonia prescribed by her PCP and has been having some diarrhea over the last couple days which she thinks is from antibiotics.  She is not sure if they are helping or not as she still has a cough and some shortness of breath.  She has not been coughing up any blood.  She denies any fevers, chest pain, abdominal pain, vomiting, headache, earache, sore throat or recent falls or injuries.  No recent changes to her medications.  No other acute concerns at this time.         Past Medical History:  Diagnosis Date   Atrial fibrillation ([place])    Benign breast cyst in female, left 10/07/2016   Colon adenomas    GERD (gastroesophageal reflux disease)    Hypertension    Hypothyroidism    Macular degeneration    Mitral regurgitation    Pulmonary embolism ([place]) 02/2016   SVT (supraventricular tachycardia) [facility])      Patient Active Problem List   Diagnosis Date Noted   Antibiotic-associated diarrhea 10/15/2020   ILD (interstitial lung disease) ([place]) 09/22/2020   Tachyarrhythmia 09/08/2020   Sick sinus syndrome ([place]) 09/08/2020   Elevated serum free T4 level 09/08/2020   Purpura senilis ([place]) 08/28/2020   Hiatal hernia 08/09/2020   Atrial fibrillation with rapid ventricular response ([place]) 08/06/2020   Thrombophilia ([place]) 08/06/2020   History of COVID-19 03/03/2020   Schatzki's ring of distal esophagus 12/11/2019   Erosive gastritis 11/30/2019   Dysphagia 11/27/2019   Painful swallowing 11/27/2019   Gastroesophageal reflux disease 11/27/2019   Stage 3a chronic kidney disease ([place]) 04/11/2019   Coagulopathy ([place]) 09/12/2018   Essential hypertension 09/12/2018   CHF (congestive heart failure) ([place]) 08/15/2018   Hypothyroidism due to acquired atrophy of thyroid 05/08/2018   Unintentional weight loss 05/08/2018   Myalgia due to statin 05/08/2018   Abdominal aortic atherosclerosis ([place]) 05/07/2018   Bradycardia 04/28/2018   Pleural effusion on left 02/27/2018   Moderate mitral stenosis 01/10/2018   Lumbar radiculitis 01/07/2018   B12 deficiency 07/26/2017   Atrial fibrillation status post cardioversion [facility]) 04/06/2016   Hospital discharge follow-up 03/10/2016   Vitamin D deficiency 04/08/2015   Cervical spine degeneration 12/13/2014   History of pulmonary embolism 12/02/2014   Insomnia 10/04/2014   Moderate tricuspid insufficiency 09/17/2014   Generalized anxiety disorder 03/28/2013   Mitral valve prolapse 03/28/2013   Routine adult health maintenance 03/23/2012  Fatigue 03/23/2012   Cough 04/17/2011   Screening for breast cancer 11/29/2010   Macular degeneration, left eye 11/29/2010   Screening for colon cancer 11/29/2010   Hyperlipidemia LDL goal <160 11/29/2010   GERD (gastroesophageal reflux disease)     Past Surgical History:  Procedure Laterality Date   APPENDECTOMY  1960    BREAST CYST ASPIRATION Left 2017   CARDIOVERSION N/A 04/05/2016   Procedure: Cardioversion;  Surgeon: [name], MD;  Location: [facility] ORS;  Service: Cardiovascular;  Laterality: N/A;   CHOLECYSTECTOMY  1985   ESOPHAGOGASTRODUODENOSCOPY (EGD) WITH PROPOFOL N/A 11/27/2019   Procedure: ESOPHAGOGASTRODUODENOSCOPY (EGD) WITH PROPOFOL;  Surgeon: Lesly Rubenstein, MD;  Location: [facility] ENDOSCOPY;  Service: Endoscopy;  Laterality: N/A;   ESOPHAGOGASTRODUODENOSCOPY (EGD) WITH PROPOFOL N/A 12/24/2019   Procedure: ESOPHAGOGASTRODUODENOSCOPY (EGD) WITH PROPOFOL;  Surgeon: Lesly Rubenstein, MD;  Location: [facility] ENDOSCOPY;  Service: Endoscopy;  Laterality: N/A;   OVARIAN CYST REMOVAL     TEE WITHOUT CARDIOVERSION N/A 02/01/2018   Procedure: TRANSESOPHAGEAL ECHOCARDIOGRAM (TEE);  Surgeon: [name], MD;  Location: [facility] ORS;  Service: Cardiovascular;  Laterality: N/A;    Prior to Admission medications   Medication Sig Start Date End Date Taking? Authorizing Provider  acetaminophen (TYLENOL) 500 MG tablet Take 500 mg by mouth every 6 (six) hours as needed.    [provider]  ALPRAZolam Duanne Moron) 0.25 MG tablet Take 1 tablet (0.25 mg total) by mouth at bedtime as needed for anxiety or sleep. 08/26/20   [name], MD  amiodarone (PACERONE) 200 MG tablet Take 200 mg by mouth 2 (two) times daily.    [provider]  benzonatate (TESSALON) 200 MG capsule Take 1 capsule (200 mg total) by mouth 3 (three) times daily as needed for cough. Patient not taking: Reported on 10/15/2020 09/22/20   [name], MD  Bevacizumab (AVASTIN IV) 25 mg/mL by Intravitreal route. Every nine weeks    [provider]  diltiazem (CARDIZEM) 60 MG tablet Take 1 tablet (60 mg total) by mouth every 8 (eight) hours. 09/09/20 10/09/20  Wyvonnia Dusky, MD  esomeprazole (NEXIUM) 40 MG capsule Take 1 capsule (40 mg total) by mouth daily. Patient not taking: Reported on 10/15/2020 09/22/20   [name], MD  levothyroxine (EUTHYROX) 50 MCG tablet TAKE 1 TABLET BY MOUTH ONCE DAILY 30  MINUTES  PRIOR  TO  EATING; 09/11/20   [name], MD  MELATONIN GUMMIES PO Take 10 mg by mouth at bedtime as needed.    [provider]  XARELTO 15 MG TABS tablet TAKE 1 TABLET EVERY DAY 08/13/20   [name], MD    Allergies Statins  Family History  Problem Relation Age of Onset   Cancer Mother 7       breast cancer, lived to 49,    Breast cancer Mother 15   Cancer Son 7       pancreatic cancer   Heart disease Son        CAD, Tobacco Abuse    Heart disease Maternal Grandmother 80       died of massive MI   Stroke Sister     Social History Social History   Tobacco Use   Smoking status: Never   Smokeless tobacco: Never   Tobacco comments:    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 , worked at [employer], Development worker, 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 used  Substance Use Topics   Alcohol use: No  Drug use: No    Review of Systems  Review of Systems  Constitutional:  Positive for malaise/fatigue. Negative for chills and fever.  HENT:  Negative for sore throat.   Eyes:  Negative for pain.  Respiratory:  Positive for cough and shortness of breath. Negative for stridor.   Cardiovascular:  Positive for palpitations. Negative for chest pain.  Gastrointestinal:  Positive for diarrhea. Negative for abdominal pain, nausea and vomiting.  Genitourinary:  Negative for dysuria.  Musculoskeletal:  Negative for myalgias.  Skin:  Negative for rash.  Neurological:  Positive for weakness. Negative for seizures, loss of consciousness and headaches.  Psychiatric/Behavioral:  Negative for suicidal ideas.   All other systems reviewed and are negative.    ____________________________________________   PHYSICAL EXAM:  VITAL SIGNS: ED Triage Vitals  Enc Vitals Group     BP 10/15/20 1432 114/75     Pulse Rate 10/15/20 1432 (!) 111     Resp 10/15/20 1432 20     Temp 10/15/20 1432 (!) 97.4 F  (36.3 C)     Temp Source 10/15/20 1432 Oral     SpO2 10/15/20 1432 96 %     Weight 10/15/20 1430 98 lb 12.3 oz (44.8 kg)     Height 10/15/20 1430 '5\' 2"'$  (1.575 m)     Head Circumference --      Peak Flow --      Pain Score 10/15/20 1432 0     Pain Loc --      Pain Edu? --      Excl. in [place]? --    Vitals:   10/15/20 2254 10/15/20 2319  BP: (!) 150/110 (!) 135/98  Pulse: (!) 103 (!) 122  Resp: (!) 27 20  Temp:    SpO2: 94% 94%   Physical Exam Vitals and nursing note reviewed.  Constitutional:      General: She is not in acute distress.    Appearance: She is well-developed.  HENT:     Head: Normocephalic and atraumatic.     Right Ear: External ear normal.     Left Ear: External ear normal.     Nose: Nose normal.     Mouth/Throat:     Mouth: Mucous membranes are dry.  Eyes:     Conjunctiva/sclera: Conjunctivae normal.  Cardiovascular:     Rate and Rhythm: Tachycardia present. Rhythm irregular.     Heart sounds: Murmur heard.  Pulmonary:     Effort: Pulmonary effort is normal. No respiratory distress.     Breath sounds: Normal breath sounds.  Abdominal:     Palpations: Abdomen is soft.     Tenderness: There is no abdominal tenderness.  Musculoskeletal:     Cervical back: Neck supple.     Right lower leg: No edema.     Left lower leg: No edema.  Skin:    General: Skin is warm and dry.     Capillary Refill: Capillary refill takes 2 to 3 seconds.  Neurological:     Mental Status: She is alert and oriented to person, place, and time.  Psychiatric:        Mood and Affect: Mood normal.     ____________________________________________   LABS (all labs ordered are listed, but only abnormal results are displayed)  Labs Reviewed  BASIC METABOLIC PANEL - Abnormal; Notable for the following components:      Result Value   Glucose, Bld 135 (*)    Creatinine, Ser 1.03 (*)    Calcium 8.7 (*)  GFR, Estimated 52 (*)    All other components within normal limits  CBC -  Abnormal; Notable for the following components:   Hemoglobin 11.7 (*)    RDW 18.3 (*)    All other components within normal limits  PROTIME-INR - Abnormal; Notable for the following components:   Prothrombin Time 31.1 (*)    INR 3.0 (*)    All other components within normal limits  BRAIN NATRIURETIC PEPTIDE - Abnormal; Notable for the following components:   B Natriuretic Peptide 954.5 (*)    All other components within normal limits  TROPONIN I (HIGH SENSITIVITY) - Abnormal; Notable for the following components:   Troponin I (High Sensitivity) 18 (*)    All other components within normal limits  RESP PANEL BY RT-PCR (FLU A&B, COVID) ARPGX2  C DIFFICILE QUICK SCREEN W PCR REFLEX    GASTROINTESTINAL PANEL BY PCR, STOOL (REPLACES STOOL CULTURE)  CULTURE, BLOOD (SINGLE)  MAGNESIUM  TSH  PROCALCITONIN   ____________________________________________  EKG  A. fib with rapid ventricular response at 117, right bundle branch block, left anterior fascicle block and QTc interval of 516 with multiple nonspecific changes throughout. ____________________________________________  RADIOLOGY  ED MD interpretation: Chest x-ray shows some groundglass opacities bilaterally seen on recent CT as well.  No overt edema, large effusion, pneumothorax or other clear acute thoracic process.  Official radiology report(s): DG Chest 2 View  Result Date: 10/15/2020 CLINICAL DATA:  Atrial fibrillation EXAM: CHEST - 2 VIEW COMPARISON:  09/08/2020, chest CT 10/05/2020 FINDINGS: Mild interstitial prominence. Hazy density bilaterally could reflect the ground-glass opacities seen on prior CT. There is no pleural effusion or pneumothorax. Left atrial enlargement. No acute osseous abnormality. IMPRESSION: Mild interstitial prominence. Hazy density bilaterally could reflect ground-glass opacity seen on prior CT. May be infectious/inflammatory. Left atrial enlargement. Electronically Signed   By: Macy Mis M.D.   On:  10/15/2020 15:20    ____________________________________________   PROCEDURES  Procedure(s) performed (including Critical Care):  .Critical Care  Date/Time: 10/15/2020 11:15 PM Performed by: Lucrezia Starch, MD Authorized by: Lucrezia Starch, MD   Critical care provider statement:    Critical care time (minutes):  45   Critical care was necessary to treat or prevent imminent or life-threatening deterioration of the following conditions:  Cardiac failure   Critical care was time spent personally by me on the following activities:  Discussions with consultants, evaluation of patient's response to treatment, examination of patient, ordering and performing treatments and interventions, ordering and review of laboratory studies, ordering and review of radiographic studies, pulse oximetry, re-evaluation of patient's condition, obtaining history from patient or surrogate and review of old charts   ____________________________________________   INITIAL IMPRESSION / [place] / ED COURSE     Patient presents with above-stated history exam for assessment of persistent episodes of tachycardia palpitations and fatigue as well as some persistent shortness of breath and cough after completing a course of amoxicillin for commune acquired pneumonia.  She states she is also had some diarrhea over the last 3 to 4 days nonbloody.  She has no recent fevers, chest pain, abdominal pain or other clear associated sick symptoms.  She is taking all of her medications as directed.  I suspect she has some chronic poorly controlled RVR although certainly possible this is being exacerbated by some mild dehydration, electrolyte derangements, and possible persistent pneumonia.  I have a low suspicion for PE as she states she is taking her anticoagulation.  She does  not appear thyrotoxicosis I have a low suspicion for illicit drug use.  ECG does show RVR as noted above with some nonspecific changes.  QTc  interval is prolonged.  Troponin is at the upper limit of normal at 18 and overall Evalose patient for ACS or myocarditis.  Patient has no fever or leukocytosis differential includes viral bronchitis including COVID versus atypical pneumonia.  Chest x-ray shows some groundglass opacities bilaterally seen on recent CT as well.  No overt edema, large effusion, pneumothorax or other clear acute thoracic process.  CBC shows no leukocytosis and stable hemoglobin 11.7, 11.66-monthago.  BMP shows no significant electrolyte or metabolic derangements.  INR is elevated 954 although somewhat nonspecific it is decreased from 3 months ago when it was 2353 and overall patient on arrival does not appear mildly dehydrated.  Care patient signed over to oncoming provider at approximately 2200.  Plan is to follow-up COVID and procalcitonin.  If Procalcitonin is elevated I will treat with doxycycline for possible atypical pneumonia.  If undetectable and COVID is negative I would withhold antibiotics patient likely has viral bronchitis.  She will need to be admitted to hospital service regardless for A. fib with RVR requiring diltiazem drip for rate control.   ____________________________________________   FINAL CLINICAL IMPRESSION(S) / ED DIAGNOSES  Final diagnoses:  [place]  Community acquired pneumonia, unspecified laterality  Diarrhea, unspecified type    Medications  diltiazem (CARDIZEM) 1 mg/mL load via infusion 10 mg (10 mg Intravenous Bolus from Bag 10/15/20 2234)    And  diltiazem (CARDIZEM) 125 mg in dextrose 5% 125 mL (1 mg/mL) infusion (10 mg/hr Intravenous Rate/Dose Change 10/15/20 2312)  lactated ringers bolus 500 mL (0 mLs Intravenous Stopped 10/15/20 2325)     ED Discharge Orders     None        Note:  This document was prepared using Dragon voice recognition software and may include unintentional dictation errors.    SLucrezia Starch MD 10/15/20 [id number]

## 2020-10-15 NOTE — Assessment & Plan Note (Addendum)
I have ordered and reviewed a 12 lead EKG .  She is in rapid atrial fibrillation  And relatively hypotensive. She has longstanding RBBB and LAFB.   She takes amiodarone and diltiazem at subtherapeutic doses due to recurrent episodes of bradycardia,  andhas recurrent episodes of atrial flutter as well .  She has not had her EP consult yet due to a backlog of cases,  (oct 22 is appt per [facility]).  I am not comfortable treating with extra diltiazem given her current state of dehydration.  Sending to ER for management and telemetry.

## 2020-10-15 NOTE — Progress Notes (Addendum)
Subjective:  Patient ID: Frances Kent, female    DOB: August 12, 1930  Age: 87 y.o. MRN: [id number]  CC: The primary encounter diagnosis was Tachycardia. Diagnoses of Antibiotic-associated diarrhea, Purpura senilis ([place]), and Atrial fibrillation with rapid ventricular response (HCC) were also pertinent to this visit.  HPI Frances Kent presents for  Chief Complaint  Patient presents with   Follow-up   87 yr old female with PAF/atrial flutter awaiting EP consult,  presents for follow up on recent treatment for pneumonia seen on CT chest ordered to investigate persistent productive cough.  She finished augmentin yesterday and has been taking an antibiotic.   She reports ANOREXIA or the last 2 weeks and has lost weight.  She states that for the last week has had 7-8 liquid stools daily  and is too fatigued to change clothes. PO intake has been poor due to altered taste.     At home her heart rate has fluctuated from 60 to 130.  She is short of breath with ADL's.  She Saw her cardiology group  [facility]) on august 23rd.  No changes to medication (see below):and referred to Electrophysiology but her appt is Oct 22:(see below)  "Today, patient remains on amiodarone 200 mg once daily and diltiazem 60 mg once daily and presents in atrial flutter at a ventricular rate of 102 bpm with a right bundle branch block and left anterior fascicular block. There has been discussion with the patient about the treatment for atrial fibrillation and atrial flutter which may include ablation and. Patient has been referred to electrophysiology and currently has an appointment scheduled for October 2022." [facility] cardiology visit aug 23)     Outpatient Medications Prior to Visit  Medication Sig Dispense Refill   acetaminophen (TYLENOL) 500 MG tablet Take 500 mg by mouth every 6 (six) hours as needed.     ALPRAZolam (XANAX) 0.25 MG tablet Take 1 tablet (0.25 mg total) by mouth at bedtime as needed  for anxiety or sleep. 20 tablet 3   amiodarone (PACERONE) 200 MG tablet Take 200 mg by mouth 2 (two) times daily.     Bevacizumab (AVASTIN IV) 25 mg/mL by Intravitreal route. Every nine weeks     levothyroxine (EUTHYROX) 50 MCG tablet TAKE 1 TABLET BY MOUTH ONCE DAILY 30  MINUTES  PRIOR  TO  EATING; 90 tablet 0   MELATONIN GUMMIES PO Take 10 mg by mouth at bedtime as needed.     XARELTO 15 MG TABS tablet TAKE 1 TABLET EVERY DAY 60 tablet 1   benzonatate (TESSALON) 200 MG capsule Take 1 capsule (200 mg total) by mouth 3 (three) times daily as needed for cough. (Patient not taking: Reported on 10/15/2020) 60 capsule 1   diltiazem (CARDIZEM) 60 MG tablet Take 1 tablet (60 mg total) by mouth every 8 (eight) hours. 90 tablet 0   esomeprazole (NEXIUM) 40 MG capsule Take 1 capsule (40 mg total) by mouth daily. (Patient not taking: Reported on 10/15/2020) 30 capsule 3   amoxicillin-clavulanate (AUGMENTIN) 875-125 MG tablet Take 1 tablet by mouth 2 (two) times daily. (Patient not taking: Reported on 10/15/2020) 14 tablet 0   No facility-administered medications prior to visit.    Review of Systems;  Patient denies headache, fevers,  skin rash, eye pain, sinus congestion and sinus pain, sore throat, dysphagia,  hemoptysis , cough,  wheezing, chest pain, palpitations, orthopnea, edema, abdominal pain, nausea, melena, constipation, flank pain, dysuria, hematuria, urinary  Frequency, nocturia, numbness, tingling,  seizures,  Focal weakness, Loss of consciousness,  Tremor, insomnia, depression, anxiety, and suicidal ideation.      Objective:  BP 118/82 (BP Location: Left Arm, Patient Position: Sitting, Cuff Size: Normal)   Pulse (!) 137   Temp (!) 95.6 F (35.3 C) (Temporal)   Ht '5\' 2"'$  (1.575 m)   Wt 98 lb 12.8 oz (44.8 kg)   SpO2 95%   BMI 18.07 kg/m   BP Readings from Last 3 Encounters:  10/15/20 118/82  09/22/20 (!) 138/56  09/11/20 (!) 148/58    Wt Readings from Last 3 Encounters:  10/15/20 98  lb 12.8 oz (44.8 kg)  09/22/20 100 lb 3.2 oz (45.5 kg)  09/11/20 107 lb (48.5 kg)    General appearance: lethargic but responsive and cooperative and appears ill Ears: normal TM's and external ear canals both ears Throat: lips, mucosa, and tongue normal; teeth and gums normal Neck: no adenopathy, no carotid bruit, supple, symmetrical, trachea midline and thyroid not enlarged, symmetric, no tenderness/mass/nodules Back: symmetric, no curvature. ROM normal. No CVA tenderness. Lungs: clear to auscultation bilaterally Heart: rapid and irregular  no murmur, click, rub or gallop Abdomen: soft, diffusely tender; bowel sounds hyperactive  no masses,  no organomegaly Pulses: 2+ and symmetric Skin: tenting.  Turgor poor,  purpura Lymph nodes: Cervical, supraclavicular, and axillary nodes normal.  No results found for: HGBA1C  Lab Results  Component Value Date   CREATININE 1.02 (H) 09/09/2020   CREATININE 1.16 (H) 09/08/2020   CREATININE 1.32 (H) 09/07/2020    Lab Results  Component Value Date   WBC 5.1 09/09/2020   HGB 11.2 (L) 09/09/2020   HCT 34.2 (L) 09/09/2020   PLT 239 09/09/2020   GLUCOSE 95 09/09/2020   CHOL 284 (H) 12/11/2019   TRIG 91.0 12/11/2019   HDL 100.10 12/11/2019   LDLDIRECT 185.3 03/27/2013   LDLCALC 166 (H) 12/11/2019   ALT 31 09/07/2020   AST 40 09/07/2020   NA 135 09/09/2020   K 3.7 09/09/2020   CL 104 09/09/2020   CREATININE 1.02 (H) 09/09/2020   BUN 13 09/09/2020   CO2 22 09/09/2020   TSH 2.059 09/07/2020   INR 1.03 03/04/2016    CT Chest High Resolution  Result Date: 10/05/2020 CLINICAL DATA:  87 year old female with history of persistent wet cough. Evaluate for interstitial lung disease. EXAM: CT CHEST WITHOUT CONTRAST TECHNIQUE: Multidetector CT imaging of the chest was performed following the standard protocol without intravenous contrast. High resolution imaging of the lungs, as well as inspiratory and expiratory imaging, was performed.  COMPARISON:  Chest CTA 04/02/2020. FINDINGS: Cardiovascular: Heart size is mildly enlarged with left atrial dilatation. There is no significant pericardial fluid, thickening or pericardial calcification. There is aortic atherosclerosis, as well as atherosclerosis of the great vessels of the mediastinum and the coronary arteries, including calcified atherosclerotic plaque in the left anterior descending and right coronary arteries. Mild calcifications of the aortic valve. Severe calcifications of the mitral valve and annulus. Mediastinum/Nodes: There are multiple prominent but non pathologically enlarged mediastinal and bilateral hilar lymph nodes, nonspecific. Esophagus is unremarkable in appearance. No axillary lymphadenopathy. Lungs/Pleura: High-resolution images demonstrates some patchy areas of ground-glass attenuation and septal thickening scattered throughout the lungs bilaterally. There is no discernible craniocaudal gradient. In addition, there are some more dense areas of apparent peribronchovascular ground-glass attenuation and nodular consolidation, most evident in the basal segments of the lower lobes of the lungs bilaterally with there are also some regional areas of architectural distortion and  volume loss. Scattered areas of mild cylindrical bronchiectasis are noted. No honeycombing. Inspiratory and expiratory imaging demonstrates some mild air trapping indicative of mild small airways disease. No pleural effusions. Upper Abdomen: Aortic atherosclerosis.  Status post cholecystectomy. Musculoskeletal: There are no aggressive appearing lytic or blastic lesions noted in the visualized portions of the skeleton. IMPRESSION: 1. The appearance of the lungs is most suggestive of an alternative diagnosis (not usual interstitial pneumonia) per current ATS guidelines. Findings are unusual and nonspecific, but favored to reflect an underlying infectious process. If there is persistent clinical concern for  interstitial lung disease, repeat high-resolution chest CT should be considered in 6-12 months to assess for temporal changes in the appearance of the lung parenchyma. 2. Cardiomegaly with left atrial dilatation. 3. Aortic atherosclerosis, in addition to 2 vessel coronary artery disease. 4. There are calcifications of the aortic valve and mitral valve/annulus. Echocardiographic correlation for evaluation of potential valvular dysfunction may be warranted if clinically indicated. Aortic Atherosclerosis (ICD10-I70.0). Electronically Signed   By: Vinnie Langton M.D.   On: 10/05/2020 12:06    Assessment & Plan:   Problem List Items Addressed This Visit       Unprioritized   Atrial fibrillation with rapid ventricular response ([place])    I have ordered and reviewed a 12 lead EKG .  She is in rapid atrial fibrillation  And relatively hypotensive. She has longstanding RBBB and LAFB.   She takes amiodarone and diltiazem at subtherapeutic doses due to recurrent episodes of bradycardia,  andhas recurrent episodes of atrial flutter as well .  She has not had her EP consult yet due to a backlog of cases,  (oct 22 is appt per [facility]).  I am not comfortable treating with extra diltiazem given her current state of dehydration.  Sending to ER for management and telemetry.         Purpura senilis ([place])    Secondary to chronic illness and use of anticoagulant.       Antibiotic-associated diarrhea     In the setting of recent use of Augmentin (fiished Aug 31) for pneumonia, despite use of probiotics.  c dif suspected.  Sending to ER given her dehydrated state and rapid atrial fib       Other Visit Diagnoses     Tachycardia    -  Primary   Relevant Orders   EKG 12-Lead (Completed)      I spent 30 minutes dedicated to the care of this patient on the date of this encounter o include pre-visit review of her medical history,  recent Chest CT,  cardiology office note, Face-to-face time with the patient  , review of today's EKG and prior EKGS and communication with ER Triage Nurse.   No orders of the defined types were placed in this encounter.   Medications Discontinued During This Encounter  Medication Reason   amoxicillin-clavulanate (AUGMENTIN) 875-125 MG tablet Completed Course    Follow-up: No follow-ups on file.   [name], MD

## 2020-10-15 NOTE — Assessment & Plan Note (Signed)
Secondary to chronic illness and use of anticoagulant.

## 2020-10-15 NOTE — ED Triage Notes (Signed)
Pt comes into the ED via POV c/o uncontrolled a-fib and SHOB.  Pt has been having problems with the a-fib since January but now she is having SHOB with it as well.  Pt also present with a cough, but she states she just finished her last dose of abx for the cough.  Pt currently has even and unlabored respirations.

## 2020-10-16 DIAGNOSIS — I4891 Unspecified atrial fibrillation: Secondary | ICD-10-CM | POA: Diagnosis present

## 2020-10-16 DIAGNOSIS — I480 Paroxysmal atrial fibrillation: Secondary | ICD-10-CM

## 2020-10-16 DIAGNOSIS — R059 Cough, unspecified: Secondary | ICD-10-CM

## 2020-10-16 DIAGNOSIS — I482 Chronic atrial fibrillation, unspecified: Secondary | ICD-10-CM | POA: Diagnosis present

## 2020-10-16 LAB — RESPIRATORY PANEL BY PCR

## 2020-10-16 LAB — HEMOGLOBIN A1C
Hgb A1c MFr Bld: 6.1 % — ABNORMAL HIGH (ref 4.8–5.6)
Mean Plasma Glucose: 128.37 mg/dL

## 2020-10-16 LAB — TROPONIN I (HIGH SENSITIVITY): Troponin I (High Sensitivity): 16 ng/L (ref <18)

## 2020-10-16 LAB — TSH: TSH: 0.88 u[IU]/mL (ref 0.350–4.500)

## 2020-10-16 LAB — MAGNESIUM: Magnesium: 2.4 mg/dL (ref 1.7–2.4)

## 2020-10-16 LAB — PHOSPHORUS: Phosphorus: 3.1 mg/dL (ref 2.5–4.6)

## 2020-10-16 MED ORDER — IPRATROPIUM BROMIDE 0.02 % IN SOLN: 0.5000 mg | RESPIRATORY_TRACT | 0 refills | Status: DC | PRN

## 2020-10-16 MED ORDER — IPRATROPIUM BROMIDE 0.02 % IN SOLN: 0.5000 mg | RESPIRATORY_TRACT | Status: DC | PRN

## 2020-10-16 MED ORDER — AMIODARONE HCL 200 MG PO TABS
200.0000 mg | ORAL_TABLET | Freq: Two times a day (BID) | ORAL | Status: DC
  Filled 2020-10-16 (×2): qty 1

## 2020-10-16 MED ORDER — ACETAMINOPHEN 325 MG PO TABS: 650.0000 mg | ORAL_TABLET | Freq: Four times a day (QID) | ORAL | Status: DC | PRN

## 2020-10-16 MED ORDER — ONDANSETRON HCL 4 MG PO TABS: 4.0000 mg | ORAL_TABLET | Freq: Four times a day (QID) | ORAL | Status: DC | PRN

## 2020-10-16 MED ORDER — IPRATROPIUM BROMIDE HFA 17 MCG/ACT IN AERS: 2.0000 | INHALATION_SPRAY | RESPIRATORY_TRACT | Status: DC | PRN

## 2020-10-16 MED ORDER — DOXYCYCLINE HYCLATE 100 MG PO TABS
100.0000 mg | ORAL_TABLET | Freq: Two times a day (BID) | ORAL | Status: DC
  Administered 2020-10-16: 100 mg via ORAL
  Filled 2020-10-16: qty 1

## 2020-10-16 MED ORDER — ONDANSETRON HCL 4 MG/2ML IJ SOLN: 4.0000 mg | Freq: Four times a day (QID) | INTRAMUSCULAR | Status: DC | PRN

## 2020-10-16 MED ORDER — DOXYCYCLINE HYCLATE 100 MG PO TABS
100.0000 mg | ORAL_TABLET | Freq: Once | ORAL | Status: AC
  Administered 2020-10-16: 100 mg via ORAL
  Filled 2020-10-16: qty 1

## 2020-10-16 MED ORDER — HYDROCOD POLST-CPM POLST ER 10-8 MG/5ML PO SUER: 5.0000 mL | Freq: Two times a day (BID) | ORAL | Status: DC | PRN

## 2020-10-16 MED ORDER — AEROCHAMBER PLUS FLO-VU MEDIUM MISC: 1.0000 | Freq: Once | Status: DC

## 2020-10-16 MED ORDER — DILTIAZEM HCL 60 MG PO TABS: 30.0000 mg | ORAL_TABLET | Freq: Three times a day (TID) | ORAL | Status: DC

## 2020-10-16 MED ORDER — ACETAMINOPHEN 650 MG RE SUPP: 650.0000 mg | Freq: Four times a day (QID) | RECTAL | Status: DC | PRN

## 2020-10-16 MED ORDER — AMIODARONE HCL 200 MG PO TABS: 200.0000 mg | ORAL_TABLET | Freq: Every day | ORAL | Status: DC

## 2020-10-16 MED ORDER — RIVAROXABAN 15 MG PO TABS: 15.0000 mg | ORAL_TABLET | Freq: Every day | ORAL | Status: DC

## 2020-10-16 MED ORDER — DILTIAZEM HCL 60 MG PO TABS
60.0000 mg | ORAL_TABLET | Freq: Three times a day (TID) | ORAL | Status: DC
  Administered 2020-10-16: 60 mg via ORAL
  Filled 2020-10-16 (×2): qty 1

## 2020-10-16 MED ORDER — SODIUM CHLORIDE 0.9 % IV SOLN: INTRAVENOUS | Status: DC

## 2020-10-16 MED ORDER — DILTIAZEM HCL 30 MG PO TABS: 30.0000 mg | ORAL_TABLET | Freq: Three times a day (TID) | ORAL | 0 refills | Status: DC

## 2020-10-16 MED ORDER — HYDROCOD POLST-CPM POLST ER 10-8 MG/5ML PO SUER: 5.0000 mL | Freq: Two times a day (BID) | ORAL | 0 refills | Status: DC | PRN

## 2020-10-16 NOTE — ED Notes (Signed)
Patient ambulated in the room, sats increased to 100% on room air, RR 20-22. Patient denies dyspnea with exertion.

## 2020-10-16 NOTE — Consult Note (Signed)
Frances Kent is a 87 y.o. female  [id number]  Primary Cardiologist: [name] MD, [place] Reason for Consultation: Atrial fibrillation with RVR  HPI: Frances Kent is a 87 y.o. female with medical history significant of   COVID with recovery, GERD, HTN, PE, hypothyroidism, PAF/atrial flutter on Xarelto, Sick sinus syndrome awaiting EP evaluation. Presents to ED with episode of afib rvr while being seen by pcp who referred patient to ED. Of noted patient has hx of recently diagnosed pna that is being tx with augmentin as outpatient with sequella of frequent stools. Patient notes she continues to have low appetite, cough that is non-productive and diarrhea. Patient denies , presyncope falls or chest pain.   Review of Systems: Patient denies chest pain, shortness of breath. Patient reports feeling better today, eating more. Ambulating better on her own.   Past Medical History:  Diagnosis Date   Atrial fibrillation ([place])    Benign breast cyst in female, left 10/07/2016   Colon adenomas    GERD (gastroesophageal reflux disease)    Hypertension    Hypothyroidism    Macular degeneration    Mitral regurgitation    Pulmonary embolism ([place]) 02/2016   SVT (supraventricular tachycardia) (H[facility])     (Not in a hospital admission)     [START ON 10/17/2020] amiodarone  200 mg Oral Daily   diltiazem  30 mg Oral Q8H   doxycycline  100 mg Oral Q12H   Rivaroxaban  15 mg Oral Q supper    Infusions:  sodium chloride 75 mL/hr at 10/16/20 1249   diltiazem (CARDIZEM) infusion Stopped (10/16/20 1244)    Allergies  Allergen Reactions   Statins     Severe myalgias    Social History   Socioeconomic History   Marital status: Widowed    Spouse name: Not on file   Number of children: Not on file   Years of education: Not on file   Highest education level: Not on file  Occupational History   Not on file  Tobacco Use   Smoking status: Never   Smokeless tobacco: Never   Tobacco comments:     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 , worked at [employer], Development worker, 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 used  Substance and Sexual Activity   Alcohol use: No   Drug use: No   Sexual activity: Not Currently  Other Topics Concern   Not on file  Social History Narrative   Has caretaking responsibility for 3 young grandchildren who live with her   Social Determinants of Health   Financial Resource Strain: Not on file  Food Insecurity: Not on file  Transportation Needs: Not on file  Physical Activity: Not on file  Stress: Not on file  Social Connections: Not on file  Intimate Partner Violence: Not on file    Family History  Problem Relation Age of Onset   Cancer Mother 33       breast cancer, lived to 28,    Breast cancer Mother 23   Cancer Son 91       pancreatic cancer   Heart disease Son        CAD, Tobacco Abuse    Heart disease Maternal Grandmother 80       died of massive MI   Stroke Sister     PHYSICAL EXAM: Vitals:   10/16/20 1200 10/16/20 1230  BP: (!) 167/152 135/63  Pulse: (!) 118 75  Resp: 17 (!) 26  Temp:    SpO2: 96% 95%  Intake/Output Summary (Last 24 hours) at 10/16/2020 1357 Last data filed at 10/16/2020 1244 Gross per 24 hour  Intake 599.61 ml  Output --  Net 599.61 ml    General:  Well appearing. No respiratory difficulty HEENT: normal Neck: supple. no JVD. Carotids 2+ bilat; no bruits. No lymphadenopathy or thryomegaly appreciated. Cor: PMI nondisplaced. Regular rate & rhythm. No rubs, gallops or murmurs. Lungs: clear Abdomen: soft, nontender, nondistended. No hepatosplenomegaly. No bruits or masses. Good bowel sounds. Extremities: no cyanosis, clubbing, rash, edema Neuro: alert & oriented x 3, cranial nerves grossly intact. moves all 4 extremities w/o difficulty. Affect pleasant.  ECG: atrial fibrillation with RVR, HR 117 bpm, RBBB  Results for orders placed or performed during the hospital encounter of 10/15/20 (from the past 24 hour(s))  Basic  metabolic panel     Status: Abnormal   Collection Time: 10/15/20  2:37 PM  Result Value Ref Range   Sodium 135 135 - 145 mmol/L   Potassium 4.6 3.5 - 5.1 mmol/L   Chloride 103 98 - 111 mmol/L   CO2 25 22 - 32 mmol/L   Glucose, Bld 135 (H) 70 - 99 mg/dL   BUN 17 8 - 23 mg/dL   Creatinine, Ser 1.03 (H) 0.44 - 1.00 mg/dL   Calcium 8.7 (L) 8.9 - 10.3 mg/dL   GFR, Estimated 52 (L) >60 mL/min   Anion gap 7 5 - 15  CBC     Status: Abnormal   Collection Time: 10/15/20  2:37 PM  Result Value Ref Range   WBC 7.6 4.0 - 10.5 K/uL   RBC 4.08 3.87 - 5.11 MIL/uL   Hemoglobin 11.7 (L) 12.0 - 15.0 g/dL   HCT 36.0 36.0 - 46.0 %   MCV 88.2 80.0 - 100.0 fL   MCH 28.7 26.0 - 34.0 pg   MCHC 32.5 30.0 - 36.0 g/dL   RDW 18.3 (H) 11.5 - 15.5 %   Platelets 319 150 - 400 K/uL   nRBC 0.0 0.0 - 0.2 %  Protime-INR- (order if Patient is taking Coumadin / Warfarin)     Status: Abnormal   Collection Time: 10/15/20  2:37 PM  Result Value Ref Range   Prothrombin Time 31.1 (H) 11.4 - 15.2 seconds   INR 3.0 (H) 0.8 - 1.2  Brain natriuretic peptide     Status: Abnormal   Collection Time: 10/15/20  2:37 PM  Result Value Ref Range   B Natriuretic Peptide 954.5 (H) 0.0 - 100.0 pg/mL  Resp Panel by RT-PCR (Flu A&B, Covid) Nasopharyngeal Swab     Status: None   Collection Time: 10/15/20  9:58 PM   Specimen: Nasopharyngeal Swab; Nasopharyngeal(NP) swabs in vial transport medium  Result Value Ref Range   SARS Coronavirus 2 by RT PCR NEGATIVE NEGATIVE   Influenza A by PCR NEGATIVE NEGATIVE   Influenza B by PCR NEGATIVE NEGATIVE  Magnesium     Status: None   Collection Time: 10/15/20 10:36 PM  Result Value Ref Range   Magnesium 2.4 1.7 - 2.4 mg/dL  TSH     Status: None   Collection Time: 10/15/20 10:36 PM  Result Value Ref Range   TSH 1.326 0.350 - 4.500 uIU/mL  Procalcitonin - Baseline     Status: None   Collection Time: 10/15/20 10:36 PM  Result Value Ref Range   Procalcitonin 0.17 ng/mL  Troponin I (High  Sensitivity)     Status: Abnormal   Collection Time: 10/15/20 10:36 PM  Result Value Ref Range  Troponin I (High Sensitivity) 18 (H) <18 ng/L  Blood culture (single)     Status: None (Preliminary result)   Collection Time: 10/15/20 11:39 PM   Specimen: BLOOD  Result Value Ref Range   Specimen Description BLOOD RIGHT ARM    Special Requests      BOTTLES DRAWN AEROBIC AND ANAEROBIC Blood Culture adequate volume   Culture      NO GROWTH < 12 HOURS Performed at [facility], [address], [place],  [address]    Report Status PENDING   Troponin I (High Sensitivity)     Status: None   Collection Time: 10/16/20 12:24 AM  Result Value Ref Range   Troponin I (High Sensitivity) 16 <18 ng/L  Magnesium     Status: None   Collection Time: 10/16/20  6:59 AM  Result Value Ref Range   Magnesium 2.4 1.7 - 2.4 mg/dL  Phosphorus     Status: None   Collection Time: 10/16/20  6:59 AM  Result Value Ref Range   Phosphorus 3.1 2.5 - 4.6 mg/dL  TSH     Status: None   Collection Time: 10/16/20  6:59 AM  Result Value Ref Range   TSH 0.880 0.350 - 4.500 uIU/mL  Hemoglobin A1c     Status: Abnormal   Collection Time: 10/16/20  6:59 AM  Result Value Ref Range   Hgb A1c MFr Bld 6.1 (H) 4.8 - 5.6 %   Mean Plasma Glucose 128.37 mg/dL   DG Chest 2 View  Result Date: 10/15/2020 CLINICAL DATA:  Atrial fibrillation EXAM: CHEST - 2 VIEW COMPARISON:  09/08/2020, chest CT 10/05/2020 FINDINGS: Mild interstitial prominence. Hazy density bilaterally could reflect the ground-glass opacities seen on prior CT. There is no pleural effusion or pneumothorax. Left atrial enlargement. No acute osseous abnormality. IMPRESSION: Mild interstitial prominence. Hazy density bilaterally could reflect ground-glass opacity seen on prior CT. May be infectious/inflammatory. Left atrial enlargement. Electronically Signed   By: Macy Mis M.D.   On: 10/15/2020 15:20     ASSESSMENT AND PLAN: Patient feeling better  today. Heart rate improved, still in atrial fibrillation. No evidence of pulmonary fibrosis on CT scan. Continue amiodarone and IV Cardizem. We will continue to follow.  Engineer, drilling FNP-C

## 2020-10-16 NOTE — Discharge Summary (Signed)
Discharge Summary  Frances Kent [id number] DOB: 09/28/1930  PCP: [name], MD  Admit date: 10/15/2020 Discharge date: 10/16/2020  Time spent: 22mns, more than 50% time spent on coordination of care.   Recommendations for Outpatient Follow-up:  F/u with PCP within a week  for hospital discharge follow up, repeat cbc/bmp at follow up F/u with cardiology for a fib  Follow up with pulmonology Dr GPatsey Bertholdin 4-6 weeks   Discharge Diagnoses:  Active Hospital Problems   Diagnosis Date Noted   A-fib ([facility] 10/16/2020    Resolved Hospital Problems  No resolved problems to display.    Discharge Condition: stable  Diet recommendation: heart healthy/carb modified  Filed Weights   10/15/20 1430  Weight: 44.8 kg    History of present illness: ( per admitting MD Dr TMarcello Moores Chief Complaint: afib with rvr   HPI: JXINYAN BUNis a 87y.o. female with medical history significant of   COVID with recovery, GERD, HTN, PE,hypothyroidism,interim hx of atypical p PAF/atrial flutter  on xarelto, Sick sinus syndrome awaiting EP evaluation presents to ed with episode of afib rvr while being seen by pcp who referred patient to ed. Of noted patient has hx of recently diagnosed pna that is being tx with augmentin as outpatient with sequella of frequent stools. Patient notes she continues to have low appetite, cough that is non-productive and diarrhea. Patient denies , presyncope falls or chest pain.   ED Course: 95.6, hr 137 at 95% bp  Ekg afb with rvr Labs: Wbc 7.6, hgb 11.7 at baseline, plt 319 NA:135, K 4.6, glu 135, cr 1.03 ( at baseline) Inr 3 Bnp 954.5 Covid negative CE18,16 Procal0.17 [id number] prominence. Hazy density bilaterally could reflect ground-glass opacity seen on prior CT. May be infectious/inflammatory.  Hospital Course:  Active Problems:   A-fib (HCC)  Afib/RVR, paroxysmal A. fib Possible due to stress from respiratory issues Initially  required Cardizem drip, heart rate has improved, and converted to sinus rhythm, transitioned to p.o. Cardizem She is seen by cardiology and cleared to discharge home with continue amiodarone and increase Cardizem from 30 mg twice a day to 30 mg 3 times daily She is to follow-up with cardiology closely, of note there is also concern of sick sinus syndrome awaiting EP eval outpatient Continue Xarelto  Intractable cough for several weeks -She was treated with several course of antibiotic prior to admission, without improvement -High resolution CT scan obtained on 8/22 was reviewed with pulmonologist Dr. GPatsey Bertholdwho think is consistent with possible viral bronchitis versus post Coley postinflammatory fibrosis which should be self-limiting -Respiratory viral panel this hospitalization is negative -Patient is reluctant to try a steroid due to causing tachycardia -She is cleared to discharge home by pulmonologist, advised to continue as needed cough suppressant, and prescribe as needed ipratropium nebulizer -She is to follow-up with PCP in 1 week with pulmonology in 4 to 6 weeks -She ambulated on room air no hypoxia   History of PE on Xarelto  No diarrhea observed in the hospital  Body mass index is 18.06 kg/m.   Procedures: None  Consultations: Cardiology Pulmonology  Discharge Exam: BP 134/75   Pulse 78   Temp 98.8 F (37.1 C) (Oral)   Resp (!) 27   Ht '5\' 2"'$  (1.575 m)   Wt 44.8 kg   SpO2 92%   BMI 18.06 kg/m   General: Frail, NAD Cardiovascular: RRR Respiratory: Clear, normal respiratory effort  Discharge Instructions You were cared for by  a hospitalist during your hospital stay. If you have any questions about your discharge medications or the care you received while you were in the hospital after you are discharged, you can call the unit and asked to speak with the hospitalist on call if the hospitalist that took care of you is not available. Once you are discharged, your  primary care physician will handle any further medical issues. Please note that NO REFILLS for any discharge medications will be authorized once you are discharged, as it is imperative that you return to your primary care physician (or establish a relationship with a primary care physician if you do not have one) for your aftercare needs so that they can reassess your need for medications and monitor your lab values.  Discharge Instructions     Diet - low sodium heart healthy   Complete by: As directed    Carb modified diet   Increase activity slowly   Complete by: As directed       Allergies as of 10/16/2020       Reactions   Statins    Severe myalgias        Medication List     TAKE these medications    acetaminophen 500 MG tablet Commonly known as: TYLENOL Take 500 mg by mouth every 6 (six) hours as needed.   ALPRAZolam 0.25 MG tablet Commonly known as: XANAX Take 1 tablet (0.25 mg total) by mouth at bedtime as needed for anxiety or sleep.   amiodarone 200 MG tablet Commonly known as: PACERONE Take 200 mg by mouth daily.   benzonatate 200 MG capsule Commonly known as: TESSALON Take 1 capsule (200 mg total) by mouth 3 (three) times daily as needed for cough.   chlorpheniramine-HYDROcodone 10-8 MG/5ML Suer Commonly known as: TUSSIONEX Take 5 mLs by mouth every 12 (twelve) hours as needed for cough.   diltiazem 30 MG tablet Commonly known as: CARDIZEM Take 1 tablet (30 mg total) by mouth every 8 (eight) hours. What changed:  medication strength how much to take when to take this   esomeprazole 40 MG capsule Commonly known as: NexIUM Take 1 capsule (40 mg total) by mouth daily.   furosemide 40 MG tablet Commonly known as: LASIX Take 40 mg by mouth daily as needed for fluid.   ipratropium 0.02 % nebulizer solution Commonly known as: ATROVENT Take 2.5 mLs (0.5 mg total) by nebulization every 4 (four) hours as needed for wheezing or shortness of breath  (cough).   levothyroxine 50 MCG tablet Commonly known as: Euthyrox TAKE 1 TABLET BY MOUTH ONCE DAILY 30  MINUTES  PRIOR  TO  EATING;   Xarelto 15 MG Tabs tablet Generic drug: Rivaroxaban TAKE 1 TABLET EVERY DAY       Allergies  Allergen Reactions   Statins     Severe myalgias    Follow-up Information     [name], MD Follow up in 1 week(s).   Specialty: Internal Medicine Why: hospital discharge follow up Contact information: [place] [address] [phone]         Tyler Pita, MD Follow up in 4 week(s).   Specialty: Pulmonary Disease Contact information: [place] [address] [phone]         [name], MD Follow up in 2 week(s).   Specialty: Cardiology Why: for afib management Contact information: [address] [facility] [place] [address] [phone]  The results of significant diagnostics from this hospitalization (including imaging, microbiology, ancillary and laboratory) are listed below for reference.    Significant Diagnostic Studies: DG Chest 2 View  Result Date: 10/15/2020 CLINICAL DATA:  Atrial fibrillation EXAM: CHEST - 2 VIEW COMPARISON:  09/08/2020, chest CT 10/05/2020 FINDINGS: Mild interstitial prominence. Hazy density bilaterally could reflect the ground-glass opacities seen on prior CT. There is no pleural effusion or pneumothorax. Left atrial enlargement. No acute osseous abnormality. IMPRESSION: Mild interstitial prominence. Hazy density bilaterally could reflect ground-glass opacity seen on prior CT. May be infectious/inflammatory. Left atrial enlargement. Electronically Signed   By: Macy Mis M.D.   On: 10/15/2020 15:20   CT Chest High Resolution  Result Date: 10/05/2020 CLINICAL DATA:  87 year old female with history of persistent wet cough. Evaluate for interstitial lung disease. EXAM:  CT CHEST WITHOUT CONTRAST TECHNIQUE: Multidetector CT imaging of the chest was performed following the standard protocol without intravenous contrast. High resolution imaging of the lungs, as well as inspiratory and expiratory imaging, was performed. COMPARISON:  Chest CTA 04/02/2020. FINDINGS: Cardiovascular: Heart size is mildly enlarged with left atrial dilatation. There is no significant pericardial fluid, thickening or pericardial calcification. There is aortic atherosclerosis, as well as atherosclerosis of the great vessels of the mediastinum and the coronary arteries, including calcified atherosclerotic plaque in the left anterior descending and right coronary arteries. Mild calcifications of the aortic valve. Severe calcifications of the mitral valve and annulus. Mediastinum/Nodes: There are multiple prominent but non pathologically enlarged mediastinal and bilateral hilar lymph nodes, nonspecific. Esophagus is unremarkable in appearance. No axillary lymphadenopathy. Lungs/Pleura: High-resolution images demonstrates some patchy areas of ground-glass attenuation and septal thickening scattered throughout the lungs bilaterally. There is no discernible craniocaudal gradient. In addition, there are some more dense areas of apparent peribronchovascular ground-glass attenuation and nodular consolidation, most evident in the basal segments of the lower lobes of the lungs bilaterally with there are also some regional areas of architectural distortion and volume loss. Scattered areas of mild cylindrical bronchiectasis are noted. No honeycombing. Inspiratory and expiratory imaging demonstrates some mild air trapping indicative of mild small airways disease. No pleural effusions. Upper Abdomen: Aortic atherosclerosis.  Status post cholecystectomy. Musculoskeletal: There are no aggressive appearing lytic or blastic lesions noted in the visualized portions of the skeleton. IMPRESSION: 1. The appearance of the lungs is  most suggestive of an alternative diagnosis (not usual interstitial pneumonia) per current ATS guidelines. Findings are unusual and nonspecific, but favored to reflect an underlying infectious process. If there is persistent clinical concern for interstitial lung disease, repeat high-resolution chest CT should be considered in 6-12 months to assess for temporal changes in the appearance of the lung parenchyma. 2. Cardiomegaly with left atrial dilatation. 3. Aortic atherosclerosis, in addition to 2 vessel coronary artery disease. 4. There are calcifications of the aortic valve and mitral valve/annulus. Echocardiographic correlation for evaluation of potential valvular dysfunction may be warranted if clinically indicated. Aortic Atherosclerosis (ICD10-I70.0). Electronically Signed   By: Vinnie Langton M.D.   On: 10/05/2020 12:06    Microbiology: Recent Results (from the past 240 hour(s))  Resp Panel by RT-PCR (Flu A&B, Covid) Nasopharyngeal Swab     Status: None   Collection Time: 10/15/20  9:58 PM   Specimen: Nasopharyngeal Swab; Nasopharyngeal(NP) swabs in vial transport medium  Result Value Ref Range Status   SARS Coronavirus 2 by RT PCR NEGATIVE NEGATIVE Final    Comment: (NOTE) SARS-CoV-2 target nucleic acids are NOT DETECTED.  The SARS-CoV-2 RNA  is generally detectable in upper respiratory specimens during the acute phase of infection. The lowest concentration of SARS-CoV-2 viral copies this assay can detect is 138 copies/mL. A negative result does not preclude SARS-Cov-2 infection and should not be used as the sole basis for treatment or other patient management decisions. A negative result may occur with  improper specimen collection/handling, submission of specimen other than nasopharyngeal swab, presence of viral mutation(s) within the areas targeted by this assay, and inadequate number of viral copies(<138 copies/mL). A negative result must be combined with clinical observations,  patient history, and epidemiological information. The expected result is Negative.  Fact Sheet for Patients:  EntrepreneurPulse.com.au  Fact Sheet for Healthcare Providers:  IncredibleEmployment.be  This test is no t yet approved or cleared by the [place] FDA and  has been authorized for detection and/or diagnosis of SARS-CoV-2 by FDA under an Emergency Use Authorization (EUA). This EUA will remain  in effect (meaning this test can be used) for the duration of the COVID-19 declaration under Section 564(b)(1) of the Act, 21 U.S.C.section 360bbb-3(b)(1), unless the authorization is terminated  or revoked sooner.       Influenza A by PCR NEGATIVE NEGATIVE Final   Influenza B by PCR NEGATIVE NEGATIVE Final    Comment: (NOTE) The Xpert Xpress SARS-CoV-2/FLU/RSV plus assay is intended as an aid in the diagnosis of influenza from Nasopharyngeal swab specimens and should not be used as a sole basis for treatment. Nasal washings and aspirates are unacceptable for Xpert Xpress SARS-CoV-2/FLU/RSV testing.  Fact Sheet for Patients: EntrepreneurPulse.com.au  Fact Sheet for Healthcare Providers: IncredibleEmployment.be  This test is not yet approved or cleared by the [place] FDA and has been authorized for detection and/or diagnosis of SARS-CoV-2 by FDA under an Emergency Use Authorization (EUA). This EUA will remain in effect (meaning this test can be used) for the duration of the COVID-19 declaration under Section 564(b)(1) of the Act, 21 U.S.C. section 360bbb-3(b)(1), unless the authorization is terminated or revoked.  Performed at [facility], [address] [place], [place] [address]   Blood culture (single)     Status: None (Preliminary result)   Collection Time: 10/15/20 11:39 PM   Specimen: BLOOD  Result Value Ref Range Status   Specimen Description BLOOD RIGHT ARM  Final    Special Requests   Final    BOTTLES DRAWN AEROBIC AND ANAEROBIC Blood Culture adequate volume   Culture   Final    NO GROWTH < 12 HOURS Performed at [facility], [address] [place], [place] [address]    Report Status PENDING  Incomplete  Respiratory (~20 pathogens) panel by PCR     Status: None   Collection Time: 10/16/20  2:30 PM   Specimen: Nasopharyngeal Swab; Respiratory  Result Value Ref Range Status   Adenovirus NOT DETECTED NOT DETECTED Final   Coronavirus 229E NOT DETECTED NOT DETECTED Final    Comment: (NOTE) The Coronavirus on the Respiratory Panel, DOES NOT test for the novel  Coronavirus (2019 nCoV)    Coronavirus HKU1 NOT DETECTED NOT DETECTED Final   Coronavirus NL63 NOT DETECTED NOT DETECTED Final   Coronavirus OC43 NOT DETECTED NOT DETECTED Final   Metapneumovirus NOT DETECTED NOT DETECTED Final   Rhinovirus / Enterovirus NOT DETECTED NOT DETECTED Final   Influenza A NOT DETECTED NOT DETECTED Final   Influenza B NOT DETECTED NOT DETECTED Final   Parainfluenza Virus 1 NOT DETECTED NOT DETECTED Final   Parainfluenza Virus 2 NOT  DETECTED NOT DETECTED Final   Parainfluenza Virus 3 NOT DETECTED NOT DETECTED Final   Parainfluenza Virus 4 NOT DETECTED NOT DETECTED Final   Respiratory Syncytial Virus NOT DETECTED NOT DETECTED Final   Bordetella pertussis NOT DETECTED NOT DETECTED Final   Bordetella Parapertussis NOT DETECTED NOT DETECTED Final   Chlamydophila pneumoniae NOT DETECTED NOT DETECTED Final   Mycoplasma pneumoniae NOT DETECTED NOT DETECTED Final    Comment: Performed at [facility], [place] [address], [place], [place] [address]     Labs: Basic Metabolic Panel: Recent Labs  Lab 10/15/20 1437 10/15/20 2236 10/16/20 0659  NA 135  --   --   K 4.6  --   --   CL 103  --   --   CO2 25  --   --   GLUCOSE 135*  --   --   BUN 17  --   --   CREATININE 1.03*  --   --   CALCIUM 8.7*  --   --   MG  --  2.4 2.4  PHOS  --   --  3.1   Liver  Function Tests: No results for input(s): AST, ALT, ALKPHOS, BILITOT, PROT, ALBUMIN in the last 168 hours. No results for input(s): LIPASE, AMYLASE in the last 168 hours. No results for input(s): AMMONIA in the last 168 hours. CBC: Recent Labs  Lab 10/15/20 1437  WBC 7.6  HGB 11.7*  HCT 36.0  MCV 88.2  PLT 319   Cardiac Enzymes: No results for input(s): CKTOTAL, CKMB, CKMBINDEX, TROPONINI in the last 168 hours. BNP: BNP (last 3 results) Recent Labs    04/02/20 1021 07/12/20 1113 10/15/20 1437  BNP 2,094.8* 2,353.6* 954.5*    ProBNP (last 3 results) No results for input(s): PROBNP in the last 8760 hours.  CBG: No results for input(s): GLUCAP in the last 168 hours.     Signed:  Florencia Reasons MD, PhD, FACP  [facility] 10/16/2020, 10:24 PM

## 2020-10-16 NOTE — Care Management CC44 (Signed)
Condition Code 44 Documentation Completed  Patient Details  Name: Frances Kent MRN: [id number] Date of Birth: [birth year]   Condition Code 44 given:  Yes Patient signature on Condition Code 44 notice:  Yes Documentation of 2 MD's agreement:  Yes Code 44 added to claim:  Yes    Adelene Amas, [place] 10/16/2020, 4:32 PM

## 2020-10-16 NOTE — Care Management Obs Status (Signed)
[place] NOTIFICATION   Patient Details  Name: Frances Kent MRN: [id number] Date of Birth: 17-Apr-1930   Medicare Observation Status Notification Given:  Yes    Ova Freshwater 10/16/2020, 4:32 PM

## 2020-10-16 NOTE — ED Notes (Signed)
MD with patient

## 2020-10-16 NOTE — Plan of Care (Signed)
I was asked by Dr. Erlinda Hong to render opinion with regards to [name]'s imaging.  Frances Kent is an 87 year old woman whom I previously evaluated at [place] in June 2021.  At that time there was concern for pulmonary fibrosis however this did not pan out.  She did not follow-up after her initial evaluation as requested.  Evaluation of her CT scan at that time showed she had no evidence of pulmonary fibrosis there was however evidence of pulmonary edema.  Patient has been having some issues with A. fib RVR and mitral stenosis/regurgitation.  The patient had gotten better with Lasix and she determined she did not feel she needed further follow-up with [place].  Her chest CT however as noted was benign.  Subsequently after that she had COVID-19 in January.  She states that she got "quite sick".  She received oral steroids which she states "messed up my heart rhythm".  He would like to avoid these if at all possible.  25 weeks ago she developed her intractable cough  which was mostly nonproductive and has failed multiple antibiotics.  A high-resolution CT scan of the chest performed on the 22nd showed changes consistent with possible viral bronchiolitis versus post COVID postinflammatory fibrosis which should be self-limiting.  Currently I think she may be experiencing a postinfectious cough, she had viral panel sent for evaluation.  Amend antitussives and as needed ipratropium to see if this helps with controlling the cough.  We will see her in follow-up at [place] in 4 to 6 weeks time, she will need continued follow-up with primary care prior to that.  Discussed with Dr.Xu.  C. Derrill Kay, MD Advanced Bronchoscopy PCCM [facility]

## 2020-10-16 NOTE — ED Notes (Addendum)
Report given to SerenityRN in [place].

## 2020-10-16 NOTE — H&P (Signed)
History and Physical    Frances Kent [id number] DOB: 1930-12-12 DOA: 10/15/2020  PCP: [name], MD  Patient coming from: pcp office  I have personally briefly reviewed patient's old medical records in [place]  Chief Complaint: afib with rvr  HPI: Frances Kent is a 87 y.o. female with medical history significant of   COVID with recovery, GERD, HTN, PE,hypothyroidism,interim hx of atypical p PAF/atrial flutter  on xarelto, Sick sinus syndrome awaiting EP evaluation presents to ed with episode of afib rvr while being seen by pcp who referred patient to ed. Of noted patient has hx of recently diagnosed pna that is being tx with augmentin as outpatient with sequella of frequent stools. Patient notes she continues to have low appetite, cough that is non-productive and diarrhea. Patient denies , presyncope falls or chest pain.  ED Course: 95.6, hr 137 at 95% bp  Ekg afb with rvr Labs: Wbc 7.6, hgb 11.7 at baseline, plt 319 NA:135, K 4.6, glu 135, cr 1.03 ( at baseline) Inr 3 Bnp 954.5 Covid negative CE18,16 Procal0.17 [id number] interstitial prominence. Hazy density bilaterally could reflect ground-glass opacity seen on prior CT. May be infectious/inflammatory.   Left atrial enlargementReview of Systems: As per HPI otherwise 10 point review of systems negative.   Past Medical History:  Diagnosis Date   Atrial fibrillation ([place])    Benign breast cyst in female, left 10/07/2016   Colon adenomas    GERD (gastroesophageal reflux disease)    Hypertension    Hypothyroidism    Macular degeneration    Mitral regurgitation    Pulmonary embolism ([place]) 02/2016   SVT (supraventricular tachycardia) ([place])     Past Surgical History:  Procedure Laterality Date   APPENDECTOMY  1960   BREAST CYST ASPIRATION Left 2017   CARDIOVERSION N/A 04/05/2016   Procedure: Cardioversion;  Surgeon: [name], MD;  Location: [facility] ORS;  Service: Cardiovascular;  Laterality:  N/A;   CHOLECYSTECTOMY  1985   ESOPHAGOGASTRODUODENOSCOPY (EGD) WITH PROPOFOL N/A 11/27/2019   Procedure: ESOPHAGOGASTRODUODENOSCOPY (EGD) WITH PROPOFOL;  Surgeon: Lesly Rubenstein, MD;  Location: [facility] ENDOSCOPY;  Service: Endoscopy;  Laterality: N/A;   ESOPHAGOGASTRODUODENOSCOPY (EGD) WITH PROPOFOL N/A 12/24/2019   Procedure: ESOPHAGOGASTRODUODENOSCOPY (EGD) WITH PROPOFOL;  Surgeon: Lesly Rubenstein, MD;  Location: [facility] ENDOSCOPY;  Service: Endoscopy;  Laterality: N/A;   OVARIAN CYST REMOVAL     TEE WITHOUT CARDIOVERSION N/A 02/01/2018   Procedure: TRANSESOPHAGEAL ECHOCARDIOGRAM (TEE);  Surgeon: [name], MD;  Location: [facility] ORS;  Service: Cardiovascular;  Laterality: N/A;     reports that she has never smoked. She has never used smokeless tobacco. She reports that she does not drink alcohol and does not use drugs.  Allergies  Allergen Reactions   Statins     Severe myalgias    Family History  Problem Relation Age of Onset   Cancer Mother 59       breast cancer, lived to 25,    Breast cancer Mother 39   Cancer Son 66       pancreatic cancer   Heart disease Son        CAD, Tobacco Abuse    Heart disease Maternal Grandmother 80       died of massive MI   Stroke Sister     Prior to Admission medications   Medication Sig Start Date End Date Taking? Authorizing Provider  acetaminophen (TYLENOL) 500 MG tablet Take 500 mg by mouth every 6 (six) hours as  needed.    [provider]  ALPRAZolam Duanne Moron) 0.25 MG tablet Take 1 tablet (0.25 mg total) by mouth at bedtime as needed for anxiety or sleep. 08/26/20   [name], MD  amiodarone (PACERONE) 200 MG tablet Take 200 mg by mouth 2 (two) times daily.    [provider]  benzonatate (TESSALON) 200 MG capsule Take 1 capsule (200 mg total) by mouth 3 (three) times daily as needed for cough. Patient not taking: Reported on 10/15/2020 09/22/20   [name], MD  Bevacizumab (AVASTIN IV) 25 mg/mL by  Intravitreal route. Every nine weeks    [provider]  diltiazem (CARDIZEM) 60 MG tablet Take 1 tablet (60 mg total) by mouth every 8 (eight) hours. 09/09/20 10/09/20  Wyvonnia Dusky, MD  esomeprazole (NEXIUM) 40 MG capsule Take 1 capsule (40 mg total) by mouth daily. Patient not taking: Reported on 10/15/2020 09/22/20   [name], MD  levothyroxine (EUTHYROX) 50 MCG tablet TAKE 1 TABLET BY MOUTH ONCE DAILY 30  MINUTES  PRIOR  TO  EATING; 09/11/20   [name], MD  MELATONIN GUMMIES PO Take 10 mg by mouth at bedtime as needed.    [provider]  pantoprazole (PROTONIX) 20 MG tablet Take 20 mg by mouth daily. 09/12/20   [provider]  XARELTO 15 MG TABS tablet TAKE 1 TABLET EVERY DAY 08/13/20   [name], MD    Physical Exam: Vitals:   10/16/20 0228 10/16/20 0400 10/16/20 0513 10/16/20 0554  BP: 129/77 128/69 129/80 107/71  Pulse: 80 (!) 105 80 83  Resp: 16 20 (!) 22 20  Temp:      TempSrc:      SpO2: 93% 94% 94% 93%  Weight:      Height:         Vitals:   10/16/20 0228 10/16/20 0400 10/16/20 0513 10/16/20 0554  BP: 129/77 128/69 129/80 107/71  Pulse: 80 (!) 105 80 83  Resp: 16 20 (!) 22 20  Temp:      TempSrc:      SpO2: 93% 94% 94% 93%  Weight:      Height:      Constitutional: NAD, calm, comfortable Eyes: PERRL, lids and conjunctivae normal ENMT: Mucous membranes are moist. Posterior pharynx clear of any exudate or lesions.Normal dentition.  Neck: normal, supple, no masses, no thyromegaly Respiratory: clear to auscultation bilaterally, no wheezing, no crackles. Normal respiratory effort. No accessory muscle use.  Cardiovascular: tachy irregular rhythm, no murmurs / rubs / gallops. No extremity edema. 2+ pedal pulses.  Abdomen: no tenderness, no masses palpated. No hepatosplenomegaly. Bowel sounds positive.  Musculoskeletal: no clubbing / cyanosis. No joint deformity upper and lower extremities. Good ROM, no contractures. Normal  muscle tone.  Skin: no rashes, lesions, ulcers. No induration Neurologic: CN 2-12 grossly intact. Sensation intact, Strength 5/5 in all 4.  Psychiatric: Normal judgment and insight. Alert and oriented x 3. Normal mood.    Labs on Admission: I have personally reviewed following labs and imaging studies  CBC: Recent Labs  Lab 10/15/20 1437  WBC 7.6  HGB 11.7*  HCT 36.0  MCV 88.2  PLT [id number]   Basic Metabolic Panel: Recent Labs  Lab 10/15/20 1437 10/15/20 2236  NA 135  --   K 4.6  --   CL 103  --   CO2 25  --   GLUCOSE 135*  --   BUN 17  --   CREATININE  1.03*  --   CALCIUM 8.7*  --   MG  --  2.4   GFR: Estimated Creatinine Clearance: 26.7 mL/min (A) (by C-G formula based on SCr of 1.03 mg/dL (H)). Liver Function Tests: No results for input(s): AST, ALT, ALKPHOS, BILITOT, PROT, ALBUMIN in the last 168 hours. No results for input(s): LIPASE, AMYLASE in the last 168 hours. No results for input(s): AMMONIA in the last 168 hours. Coagulation Profile: Recent Labs  Lab 10/15/20 1437  INR 3.0*   Cardiac Enzymes: No results for input(s): CKTOTAL, CKMB, CKMBINDEX, TROPONINI in the last 168 hours. BNP (last 3 results) No results for input(s): PROBNP in the last 8760 hours. HbA1C: No results for input(s): HGBA1C in the last 72 hours. CBG: No results for input(s): GLUCAP in the last 168 hours. Lipid Profile: No results for input(s): CHOL, HDL, LDLCALC, TRIG, CHOLHDL, LDLDIRECT in the last 72 hours. Thyroid Function Tests: Recent Labs    10/15/20 2236  TSH 1.326   Anemia Panel: No results for input(s): VITAMINB12, FOLATE, FERRITIN, TIBC, IRON, RETICCTPCT in the last 72 hours. Urine analysis:    Component Value Date/Time   COLORURINE YELLOW (A) 09/08/2020 0105   APPEARANCEUR HAZY (A) 09/08/2020 0105   APPEARANCEUR Clear 02/05/2013 1630   LABSPEC 1.024 09/08/2020 0105   LABSPEC 1.009 02/05/2013 1630   PHURINE 6.0 09/08/2020 0105   GLUCOSEU NEGATIVE 09/08/2020 0105    GLUCOSEU NEGATIVE 04/04/2018 1149   HGBUR NEGATIVE 09/08/2020 0105   BILIRUBINUR NEGATIVE 09/08/2020 0105   BILIRUBINUR 1 08/31/2017 1157   BILIRUBINUR Negative 02/05/2013 1630   KETONESUR 5 (A) 09/08/2020 0105   PROTEINUR 100 (A) 09/08/2020 0105   UROBILINOGEN 1.0 04/04/2018 1149   NITRITE NEGATIVE 09/08/2020 0105   LEUKOCYTESUR NEGATIVE 09/08/2020 0105   LEUKOCYTESUR 1+ 02/05/2013 1630    Radiological Exams on Admission: DG Chest 2 View  Result Date: 10/15/2020 CLINICAL DATA:  Atrial fibrillation EXAM: CHEST - 2 VIEW COMPARISON:  09/08/2020, chest CT 10/05/2020 FINDINGS: Mild interstitial prominence. Hazy density bilaterally could reflect the ground-glass opacities seen on prior CT. There is no pleural effusion or pneumothorax. Left atrial enlargement. No acute osseous abnormality. IMPRESSION: Mild interstitial prominence. Hazy density bilaterally could reflect ground-glass opacity seen on prior CT. May be infectious/inflammatory. Left atrial enlargement. Electronically Signed   By: Macy Mis M.D.   On: 10/15/2020 15:20    EKG: Independently reviewed. See above  Assessment/Plan  Afib with rvr -continue on diltiazem drip  -cardiology consult regarding current rate control agents  -admit to progressive care  -continue on xarelto  -cardiac anti-arrhythmic medications per cardiology  -may need expedited EP evaluation for possible pacer  Atypical PNA -trial of doxycycline  -check urinary ag  -pulmonary toilet   Diarrhea -due to antibiotic  -f/u on stool studies to be complete   PE -continue on xarelto   HTN  -bp currently stable will hold while on ccb drip   GERD -ppi   DVT prophylaxis:  xarelto  Code Status: full Family Communication: Bennie Hind (Daughter)  [phone] (Mobile Disposition Plan: patient  expected to be admitted greater than 2 midnights  Consults called:please call cardiology in am  Admission status: inpt    Clance Boll MD [facility]  If 7PM-7AM, please contact night-coverage [email or web address] Password [facility]  10/16/2020, 6:57 AM

## 2020-10-16 NOTE — ED Provider Notes (Signed)
Discussed with patient and her family at the bedside recommendation for admission.  She is agreeable.  Heart rate is pretty well controlled heart rate approximately 110 on diltiazem drip normotensive with slight diastolic hypertension noted  Patient understand agreeable with plan.  Discussed antibiotic recommendation and based on her procalcitonin being somewhat elevated I have initiated doxycycline.  Admission discussed with hospitalist Dr. Marcello Moores.  Patient understand agreeable with plan  Stable for admission.   Delman Kitten, MD 10/16/20 [phone]

## 2020-10-16 NOTE — Progress Notes (Signed)
Discharge instructions given to patient and daughter at bedside. Verbalized understanding. No acute distress at this time. IV removed. Daughter to transport patient home.

## 2020-10-17 LAB — URINE CULTURE: Culture: 10000 — AB

## 2020-10-20 DIAGNOSIS — H353212 Exudative age-related macular degeneration, right eye, with inactive choroidal neovascularization: Secondary | ICD-10-CM | POA: Diagnosis not present

## 2020-10-20 LAB — CULTURE, BLOOD (SINGLE)
Culture: NO GROWTH
Special Requests: ADEQUATE

## 2020-10-21 LAB — CULTURE, BLOOD (ROUTINE X 2)
Culture: NO GROWTH
Culture: NO GROWTH
Special Requests: ADEQUATE
Special Requests: ADEQUATE

## 2020-10-23 ENCOUNTER — Other Ambulatory Visit: Payer: Self-pay

## 2020-10-23 ENCOUNTER — Ambulatory Visit (INDEPENDENT_AMBULATORY_CARE_PROVIDER_SITE_OTHER): Payer: Medicare HMO | Admitting: Internal Medicine

## 2020-10-23 ENCOUNTER — Encounter: Payer: Self-pay | Admitting: Internal Medicine

## 2020-10-23 VITALS — BP 124/68 | HR 83 | Temp 98.6°F | Ht 62.0 in | Wt 99.6 lb

## 2020-10-23 DIAGNOSIS — I4891 Unspecified atrial fibrillation: Secondary | ICD-10-CM | POA: Diagnosis not present

## 2020-10-23 DIAGNOSIS — K2100 Gastro-esophageal reflux disease with esophagitis, without bleeding: Secondary | ICD-10-CM | POA: Diagnosis not present

## 2020-10-23 DIAGNOSIS — T3695XA Adverse effect of unspecified systemic antibiotic, initial encounter: Secondary | ICD-10-CM

## 2020-10-23 DIAGNOSIS — R059 Cough, unspecified: Secondary | ICD-10-CM

## 2020-10-23 DIAGNOSIS — K521 Toxic gastroenteritis and colitis: Secondary | ICD-10-CM | POA: Diagnosis not present

## 2020-10-23 NOTE — Assessment & Plan Note (Signed)
Continue current diltiazem dose as prescribed by ER physician 30 mg q 8 hours. Awaiting EP evaluation for pacemaker

## 2020-10-23 NOTE — Assessment & Plan Note (Addendum)
Persistent pneumonia unlikely.   GERD, cylindrical bronchiectasis and small airway disease noted on high res CT scan done in August

## 2020-10-23 NOTE — Assessment & Plan Note (Signed)
Patient was sent home with collection kit to rule out c dif colitis and other infectious causes continue probiotic

## 2020-10-23 NOTE — Progress Notes (Signed)
Subjective:  Patient ID: Frances Kent, female    DOB: 1930/11/02  Age: 87 y.o. MRN: [id number]  CC: The primary encounter diagnosis was Antibiotic associated enterocolitis. Diagnoses of Gastroesophageal reflux disease with esophagitis without hemorrhage, Cough, Atrial fibrillation status post cardioversion [facility]), and Antibiotic-associated diarrhea were also pertinent to this visit.  HPI RONIKA KELSON presents for   ER follow up Chief Complaint  Patient presents with   Follow-up   Sent to ER one week ago for atrial fib with RVR in the setting of dehydration and diarrhea concerning for c dificile colitis.   She was treated with IV cardizem drip and admitted but kept overnight in the ER.     Diarrhea : No stools collected during hospitalization (d cif was ordered)  she states that she only had one small stool while in house,  but had 7 stools the day she went home,  and still having more than 3 daily. Most are  liquid but some are semi solid . Denies nausea and abd pain    Atrial fib Diltiazem dose was changed to every 8 hours ,  occasionally misses the nighttime dose  9 pm   8 am ( 1 hr after thyroid dose )  and 4 pm .per daughter her pulse remains very labile but mostly high. Short of breath after just walking to bathroom.    Not sleeping well due to shortness of breath and occasional cough .  Has reflux.   Did not tolerate atrovent nebs due to worsening tachycardia and persistent cough.. not sleeping on wedge.  Reports orthopnea,  using 2 pillows or sleeps in a recliner.   For GERD ,  Using  OTC nexium bc pill is easier to swallow, everything still tastes bad and per daughter has difficulty swallowig "everything."     Outpatient Medications Prior to Visit  Medication Sig Dispense Refill   acetaminophen (TYLENOL) 500 MG tablet Take 500 mg by mouth every 6 (six) hours as needed.     ALPRAZolam (XANAX) 0.25 MG tablet Take 1 tablet (0.25 mg total) by mouth at bedtime as needed for  anxiety or sleep. 20 tablet 3   amiodarone (PACERONE) 200 MG tablet Take 200 mg by mouth daily.     chlorpheniramine-HYDROcodone (TUSSIONEX) 10-8 MG/5ML SUER Take 5 mLs by mouth every 12 (twelve) hours as needed for cough. 140 mL 0   diltiazem (CARDIZEM) 30 MG tablet Take 1 tablet (30 mg total) by mouth every 8 (eight) hours. 90 tablet 0   esomeprazole (NEXIUM) 40 MG capsule Take 1 capsule (40 mg total) by mouth daily. 30 capsule 3   furosemide (LASIX) 40 MG tablet Take 40 mg by mouth daily as needed for fluid.     levothyroxine (EUTHYROX) 50 MCG tablet TAKE 1 TABLET BY MOUTH ONCE DAILY 30  MINUTES  PRIOR  TO  EATING; 90 tablet 0   XARELTO 15 MG TABS tablet TAKE 1 TABLET EVERY DAY 60 tablet 1   benzonatate (TESSALON) 200 MG capsule Take 1 capsule (200 mg total) by mouth 3 (three) times daily as needed for cough. (Patient not taking: Reported on 10/23/2020) 60 capsule 1   ipratropium (ATROVENT) 0.02 % nebulizer solution Take 2.5 mLs (0.5 mg total) by nebulization every 4 (four) hours as needed for wheezing or shortness of breath (cough). (Patient not taking: Reported on 10/23/2020) 75 mL 0   No facility-administered medications prior to visit.    Review of Systems;  Patient denies headache, fevers, malaise,  unintentional weight loss, skin rash, eye pain, sinus congestion and sinus pain, sore throat, dysphagia,  hemoptysis , cough, dyspnea, wheezing, chest pain, palpitations, orthopnea, edema, abdominal pain, nausea, melena, diarrhea, constipation, flank pain, dysuria, hematuria, urinary  Frequency, nocturia, numbness, tingling, seizures,  Focal weakness, Loss of consciousness,  Tremor, insomnia, depression, anxiety, and suicidal ideation.      Objective:  BP 124/68 (BP Location: Left Arm, Patient Position: Sitting, Cuff Size: Normal)   Pulse 83   Temp 98.6 F (37 C) (Oral)   Ht 5' 2" (1.575 m)   Wt 99 lb 9.6 oz (45.2 kg)   SpO2 95%   BMI 18.22 kg/m   BP Readings from Last 3 Encounters:   10/23/20 124/68  10/16/20 134/75  10/15/20 118/82    Wt Readings from Last 3 Encounters:  10/23/20 99 lb 9.6 oz (45.2 kg)  10/15/20 98 lb 12.3 oz (44.8 kg)  10/15/20 98 lb 12.8 oz (44.8 kg)    General appearance: alert, cooperative and appears stated age Ears: normal TM's and external ear canals both ears Throat: lips, mucosa, and tongue normal; teeth and gums normal Neck: no adenopathy, no carotid bruit, supple, symmetrical, trachea midline and thyroid not enlarged, symmetric, no tenderness/mass/nodules Back: symmetric, no curvature. ROM normal. No CVA tenderness. Lungs: clear to auscultation bilaterally Heart: regular rate and rhythm, S1, S2 normal, no murmur, click, rub or gallop Abdomen: soft, non-tender; bowel sounds normal; no masses,  no organomegaly Pulses: 2+ and symmetric Skin: Skin color, texture, turgor normal. No rashes or lesions Lymph nodes: Cervical, supraclavicular, and axillary nodes normal.  Lab Results  Component Value Date   HGBA1C 6.1 (H) 10/16/2020    Lab Results  Component Value Date   CREATININE 1.03 (H) 10/15/2020   CREATININE 1.02 (H) 09/09/2020   CREATININE 1.16 (H) 09/08/2020    Lab Results  Component Value Date   WBC 7.6 10/15/2020   HGB 11.7 (L) 10/15/2020   HCT 36.0 10/15/2020   PLT 319 10/15/2020   GLUCOSE 135 (H) 10/15/2020   CHOL 284 (H) 12/11/2019   TRIG 91.0 12/11/2019   HDL 100.10 12/11/2019   LDLDIRECT 185.3 03/27/2013   LDLCALC 166 (H) 12/11/2019   ALT 31 09/07/2020   AST 40 09/07/2020   NA 135 10/15/2020   K 4.6 10/15/2020   CL 103 10/15/2020   CREATININE 1.03 (H) 10/15/2020   BUN 17 10/15/2020   CO2 25 10/15/2020   TSH 0.880 10/16/2020   INR 3.0 (H) 10/15/2020   HGBA1C 6.1 (H) 10/16/2020    DG Chest 2 View  Result Date: 10/15/2020 CLINICAL DATA:  Atrial fibrillation EXAM: CHEST - 2 VIEW COMPARISON:  09/08/2020, chest CT 10/05/2020 FINDINGS: Mild interstitial prominence. Hazy density bilaterally could reflect the  ground-glass opacities seen on prior CT. There is no pleural effusion or pneumothorax. Left atrial enlargement. No acute osseous abnormality. IMPRESSION: Mild interstitial prominence. Hazy density bilaterally could reflect ground-glass opacity seen on prior CT. May be infectious/inflammatory. Left atrial enlargement. Electronically Signed   By: Macy Mis M.D.   On: 10/15/2020 15:20    Assessment & Plan:   Problem List Items Addressed This Visit       Unprioritized   GERD (gastroesophageal reflux disease)    With dysphagia reported and nocturnal cough .  She underwent EGD Nov 2021 and no Schatzki's ring was found. DG esophagus in April 2022 noted only a small  hiatal hernia. Advised to sleep on a wedge to manage cough, and continue nexium  Cough    Persistent pneumonia unlikely.   GERD, cylindrical bronchiectasis and small airway disease noted on high res CT scan done in August      Atrial fibrillation status post cardioversion (HCC)    Continue current diltiazem dose as prescribed by ER physician 30 mg q 8 hours. Awaiting EP evaluation for pacemaker       Antibiotic-associated diarrhea    Patient was sent home with collection kit to rule out c dif colitis and other infectious causes continue probiotic      Other Visit Diagnoses     Antibiotic associated enterocolitis    -  Primary   Relevant Orders   GI pathogen panel by PCR, stool   Clostridium Difficile by PCR     A total of 40 minutes was spent with patient  reviewing and explaining recent labs and imaging studies done in ER, counselling patient and daughter on management of current symptoms.  and coordination of care.   There are no discontinued medications.  Follow-up: No follow-ups on file.   Teresa L Tullo, MD  

## 2020-10-23 NOTE — Patient Instructions (Signed)
Stop using the nebulized medication  Sleep in a recliner or on a wedge for your breathing issues   Please resume your probiotic or eat yogurt daily   Collect a liquid stool and return to [place] today or on Monday

## 2020-10-23 NOTE — Assessment & Plan Note (Addendum)
With dysphagia reported and nocturnal cough .  She underwent EGD Nov 2021 and no Schatzki's ring was found. DG esophagus in April 2022 noted only a small  hiatal hernia. Advised to sleep on a wedge to manage cough, and continue nexium

## 2020-10-26 DIAGNOSIS — I5020 Unspecified systolic (congestive) heart failure: Secondary | ICD-10-CM | POA: Diagnosis not present

## 2020-10-26 DIAGNOSIS — I480 Paroxysmal atrial fibrillation: Secondary | ICD-10-CM | POA: Diagnosis not present

## 2020-10-26 DIAGNOSIS — I050 Rheumatic mitral stenosis: Secondary | ICD-10-CM | POA: Diagnosis not present

## 2020-10-26 DIAGNOSIS — I341 Nonrheumatic mitral (valve) prolapse: Secondary | ICD-10-CM | POA: Diagnosis not present

## 2020-10-28 ENCOUNTER — Ambulatory Visit: Payer: Medicare HMO

## 2020-10-28 ENCOUNTER — Telehealth: Payer: Self-pay

## 2020-10-28 NOTE — Telephone Encounter (Signed)
Called patient on preferred number listed in appointment notes. No answer. Unable to leave message. Reschedule as appropriate.

## 2020-10-30 ENCOUNTER — Emergency Department: Payer: Medicare HMO

## 2020-10-30 ENCOUNTER — Inpatient Hospital Stay
Admission: EM | Admit: 2020-10-30 | Discharge: 2020-11-01 | DRG: 308 | Payer: Medicare HMO | Attending: Student | Admitting: Student

## 2020-10-30 ENCOUNTER — Other Ambulatory Visit: Payer: Self-pay

## 2020-10-30 DIAGNOSIS — Z800 Family history of malignant neoplasm of digestive organs: Secondary | ICD-10-CM | POA: Diagnosis not present

## 2020-10-30 DIAGNOSIS — R55 Syncope and collapse: Secondary | ICD-10-CM | POA: Diagnosis not present

## 2020-10-30 DIAGNOSIS — I5021 Acute systolic (congestive) heart failure: Secondary | ICD-10-CM | POA: Diagnosis not present

## 2020-10-30 DIAGNOSIS — I4891 Unspecified atrial fibrillation: Secondary | ICD-10-CM | POA: Diagnosis present

## 2020-10-30 DIAGNOSIS — R6520 Severe sepsis without septic shock: Secondary | ICD-10-CM | POA: Diagnosis not present

## 2020-10-30 DIAGNOSIS — R0600 Dyspnea, unspecified: Secondary | ICD-10-CM | POA: Diagnosis not present

## 2020-10-30 DIAGNOSIS — I6522 Occlusion and stenosis of left carotid artery: Secondary | ICD-10-CM | POA: Diagnosis present

## 2020-10-30 DIAGNOSIS — R4701 Aphasia: Secondary | ICD-10-CM | POA: Diagnosis not present

## 2020-10-30 DIAGNOSIS — R0602 Shortness of breath: Principal | ICD-10-CM

## 2020-10-30 DIAGNOSIS — Z9049 Acquired absence of other specified parts of digestive tract: Secondary | ICD-10-CM

## 2020-10-30 DIAGNOSIS — N1831 Chronic kidney disease, stage 3a: Secondary | ICD-10-CM | POA: Diagnosis present

## 2020-10-30 DIAGNOSIS — R531 Weakness: Secondary | ICD-10-CM | POA: Diagnosis not present

## 2020-10-30 DIAGNOSIS — I5023 Acute on chronic systolic (congestive) heart failure: Secondary | ICD-10-CM | POA: Diagnosis present

## 2020-10-30 DIAGNOSIS — Z8249 Family history of ischemic heart disease and other diseases of the circulatory system: Secondary | ICD-10-CM | POA: Diagnosis not present

## 2020-10-30 DIAGNOSIS — I130 Hypertensive heart and chronic kidney disease with heart failure and stage 1 through stage 4 chronic kidney disease, or unspecified chronic kidney disease: Secondary | ICD-10-CM | POA: Diagnosis not present

## 2020-10-30 DIAGNOSIS — Z978 Presence of other specified devices: Secondary | ICD-10-CM | POA: Diagnosis not present

## 2020-10-30 DIAGNOSIS — Z823 Family history of stroke: Secondary | ICD-10-CM

## 2020-10-30 DIAGNOSIS — G8191 Hemiplegia, unspecified affecting right dominant side: Secondary | ICD-10-CM | POA: Diagnosis not present

## 2020-10-30 DIAGNOSIS — I340 Nonrheumatic mitral (valve) insufficiency: Secondary | ICD-10-CM | POA: Diagnosis present

## 2020-10-30 DIAGNOSIS — I480 Paroxysmal atrial fibrillation: Secondary | ICD-10-CM | POA: Diagnosis not present

## 2020-10-30 DIAGNOSIS — I517 Cardiomegaly: Secondary | ICD-10-CM | POA: Diagnosis not present

## 2020-10-30 DIAGNOSIS — J189 Pneumonia, unspecified organism: Secondary | ICD-10-CM | POA: Diagnosis not present

## 2020-10-30 DIAGNOSIS — I509 Heart failure, unspecified: Secondary | ICD-10-CM

## 2020-10-30 DIAGNOSIS — E876 Hypokalemia: Secondary | ICD-10-CM | POA: Diagnosis present

## 2020-10-30 DIAGNOSIS — H3530 Unspecified macular degeneration: Secondary | ICD-10-CM | POA: Diagnosis present

## 2020-10-30 DIAGNOSIS — J470 Bronchiectasis with acute lower respiratory infection: Secondary | ICD-10-CM | POA: Diagnosis present

## 2020-10-30 DIAGNOSIS — I959 Hypotension, unspecified: Secondary | ICD-10-CM | POA: Diagnosis not present

## 2020-10-30 DIAGNOSIS — F419 Anxiety disorder, unspecified: Secondary | ICD-10-CM | POA: Diagnosis present

## 2020-10-30 DIAGNOSIS — E785 Hyperlipidemia, unspecified: Secondary | ICD-10-CM | POA: Diagnosis not present

## 2020-10-30 DIAGNOSIS — K219 Gastro-esophageal reflux disease without esophagitis: Secondary | ICD-10-CM | POA: Diagnosis present

## 2020-10-30 DIAGNOSIS — Z79899 Other long term (current) drug therapy: Secondary | ICD-10-CM

## 2020-10-30 DIAGNOSIS — I4819 Other persistent atrial fibrillation: Secondary | ICD-10-CM | POA: Diagnosis present

## 2020-10-30 DIAGNOSIS — Z803 Family history of malignant neoplasm of breast: Secondary | ICD-10-CM

## 2020-10-30 DIAGNOSIS — I639 Cerebral infarction, unspecified: Secondary | ICD-10-CM | POA: Diagnosis present

## 2020-10-30 DIAGNOSIS — Z7901 Long term (current) use of anticoagulants: Secondary | ICD-10-CM | POA: Diagnosis not present

## 2020-10-30 DIAGNOSIS — J90 Pleural effusion, not elsewhere classified: Secondary | ICD-10-CM | POA: Diagnosis not present

## 2020-10-30 DIAGNOSIS — Z86711 Personal history of pulmonary embolism: Secondary | ICD-10-CM

## 2020-10-30 DIAGNOSIS — R0902 Hypoxemia: Secondary | ICD-10-CM | POA: Diagnosis not present

## 2020-10-30 DIAGNOSIS — I110 Hypertensive heart disease with heart failure: Secondary | ICD-10-CM | POA: Diagnosis not present

## 2020-10-30 DIAGNOSIS — A419 Sepsis, unspecified organism: Secondary | ICD-10-CM | POA: Diagnosis not present

## 2020-10-30 DIAGNOSIS — Z20822 Contact with and (suspected) exposure to covid-19: Secondary | ICD-10-CM | POA: Diagnosis present

## 2020-10-30 DIAGNOSIS — E039 Hypothyroidism, unspecified: Secondary | ICD-10-CM | POA: Diagnosis present

## 2020-10-30 DIAGNOSIS — I63232 Cerebral infarction due to unspecified occlusion or stenosis of left carotid arteries: Secondary | ICD-10-CM | POA: Diagnosis present

## 2020-10-30 DIAGNOSIS — I4821 Permanent atrial fibrillation: Secondary | ICD-10-CM | POA: Diagnosis not present

## 2020-10-30 DIAGNOSIS — R9431 Abnormal electrocardiogram [ECG] [EKG]: Secondary | ICD-10-CM | POA: Diagnosis not present

## 2020-10-30 DIAGNOSIS — R0989 Other specified symptoms and signs involving the circulatory and respiratory systems: Secondary | ICD-10-CM | POA: Diagnosis not present

## 2020-10-30 DIAGNOSIS — I499 Cardiac arrhythmia, unspecified: Secondary | ICD-10-CM | POA: Diagnosis not present

## 2020-10-30 DIAGNOSIS — R0689 Other abnormalities of breathing: Secondary | ICD-10-CM | POA: Diagnosis not present

## 2020-10-30 LAB — COMPREHENSIVE METABOLIC PANEL
ALT: 15 U/L (ref 0–44)
AST: 24 U/L (ref 15–41)
Albumin: 3.3 g/dL — ABNORMAL LOW (ref 3.5–5.0)
Alkaline Phosphatase: 89 U/L (ref 38–126)
Anion gap: 12 (ref 5–15)
BUN: 18 mg/dL (ref 8–23)
CO2: 20 mmol/L — ABNORMAL LOW (ref 22–32)
Calcium: 8.4 mg/dL — ABNORMAL LOW (ref 8.9–10.3)
Chloride: 106 mmol/L (ref 98–111)
Creatinine, Ser: 0.91 mg/dL (ref 0.44–1.00)
GFR, Estimated: >60 mL/min (ref >60)
Glucose, Bld: 120 mg/dL — ABNORMAL HIGH (ref 70–99)
Potassium: 4.2 mmol/L (ref 3.5–5.1)
Sodium: 138 mmol/L (ref 135–145)
Total Bilirubin: 1.8 mg/dL — ABNORMAL HIGH (ref 0.3–1.2)
Total Protein: 6.8 g/dL (ref 6.5–8.1)

## 2020-10-30 LAB — CBC
HCT: 36 % (ref 36.0–46.0)
Hemoglobin: 11.4 g/dL — ABNORMAL LOW (ref 12.0–15.0)
MCH: 27.7 pg (ref 26.0–34.0)
MCHC: 31.7 g/dL (ref 30.0–36.0)
MCV: 87.6 fL (ref 80.0–100.0)
Platelets: 507 10*3/uL — ABNORMAL HIGH (ref 150–400)
RBC: 4.11 MIL/uL (ref 3.87–5.11)
RDW: 18.4 % — ABNORMAL HIGH (ref 11.5–15.5)
WBC: 10.6 10*3/uL — ABNORMAL HIGH (ref 4.0–10.5)
nRBC: 0 % (ref 0.0–0.2)

## 2020-10-30 LAB — TSH: TSH: 2.3 u[IU]/mL (ref 0.350–4.500)

## 2020-10-30 LAB — TROPONIN I (HIGH SENSITIVITY): Troponin I (High Sensitivity): 19 ng/L — ABNORMAL HIGH (ref <18)

## 2020-10-30 LAB — LACTIC ACID, PLASMA: Lactic Acid, Venous: 2 mmol/L (ref 0.5–1.9)

## 2020-10-30 LAB — BRAIN NATRIURETIC PEPTIDE: B Natriuretic Peptide: 1073.7 pg/mL — ABNORMAL HIGH (ref 0.0–100.0)

## 2020-10-30 MED ORDER — BENZONATATE 100 MG PO CAPS: 200.0000 mg | ORAL_CAPSULE | Freq: Three times a day (TID) | ORAL | Status: DC | PRN

## 2020-10-30 MED ORDER — MORPHINE SULFATE (PF) 2 MG/ML IV SOLN: 2.0000 mg | INTRAVENOUS | Status: DC | PRN

## 2020-10-30 MED ORDER — POTASSIUM CHLORIDE CRYS ER 20 MEQ PO TBCR
20.0000 meq | EXTENDED_RELEASE_TABLET | Freq: Every day | ORAL | Status: DC
  Administered 2020-10-31: 20 meq via ORAL
  Filled 2020-10-30: qty 1

## 2020-10-30 MED ORDER — DILTIAZEM HCL 25 MG/5ML IV SOLN
10.0000 mg | Freq: Once | INTRAVENOUS | Status: AC
  Administered 2020-10-30: 10 mg via INTRAVENOUS
  Filled 2020-10-30: qty 5

## 2020-10-30 MED ORDER — AMIODARONE HCL 200 MG PO TABS
200.0000 mg | ORAL_TABLET | Freq: Every day | ORAL | Status: DC
  Administered 2020-10-31 – 2020-11-01 (×2): 200 mg via ORAL
  Filled 2020-10-30 (×3): qty 1

## 2020-10-30 MED ORDER — ONDANSETRON HCL 4 MG/2ML IJ SOLN: 4.0000 mg | Freq: Four times a day (QID) | INTRAMUSCULAR | Status: DC | PRN

## 2020-10-30 MED ORDER — POLYETHYLENE GLYCOL 3350 17 G PO PACK: 17.0000 g | PACK | Freq: Every day | ORAL | Status: DC | PRN

## 2020-10-30 MED ORDER — ONDANSETRON HCL 4 MG PO TABS: 4.0000 mg | ORAL_TABLET | Freq: Four times a day (QID) | ORAL | Status: DC | PRN

## 2020-10-30 MED ORDER — ALPRAZOLAM 0.25 MG PO TABS: 0.2500 mg | ORAL_TABLET | Freq: Every evening | ORAL | Status: DC | PRN

## 2020-10-30 MED ORDER — TRAZODONE HCL 50 MG PO TABS: 25.0000 mg | ORAL_TABLET | Freq: Every evening | ORAL | Status: DC | PRN

## 2020-10-30 MED ORDER — DILTIAZEM HCL-DEXTROSE 125-5 MG/125ML-% IV SOLN (PREMIX)
5.0000 mg/h | INTRAVENOUS | Status: DC
  Administered 2020-10-30 – 2020-10-31 (×2): 5 mg/h via INTRAVENOUS
  Filled 2020-10-30 (×2): qty 125

## 2020-10-30 MED ORDER — ACETAMINOPHEN 650 MG RE SUPP: 650.0000 mg | Freq: Four times a day (QID) | RECTAL | Status: DC | PRN

## 2020-10-30 MED ORDER — FUROSEMIDE 40 MG PO TABS: 40.0000 mg | ORAL_TABLET | Freq: Every day | ORAL | Status: DC | PRN

## 2020-10-30 MED ORDER — DILTIAZEM HCL 60 MG PO TABS: 30.0000 mg | ORAL_TABLET | Freq: Three times a day (TID) | ORAL | Status: DC

## 2020-10-30 MED ORDER — FUROSEMIDE 10 MG/ML IJ SOLN
40.0000 mg | Freq: Once | INTRAMUSCULAR | Status: AC
  Administered 2020-10-30: 40 mg via INTRAVENOUS
  Filled 2020-10-30: qty 4

## 2020-10-30 MED ORDER — RIVAROXABAN 15 MG PO TABS
15.0000 mg | ORAL_TABLET | Freq: Every day | ORAL | Status: DC
  Administered 2020-10-31 – 2020-11-01 (×2): 15 mg via ORAL
  Filled 2020-10-30 (×2): qty 1

## 2020-10-30 MED ORDER — FUROSEMIDE 10 MG/ML IJ SOLN: 40.0000 mg | Freq: Every day | INTRAMUSCULAR | Status: DC

## 2020-10-30 MED ORDER — PANTOPRAZOLE SODIUM 40 MG PO TBEC
40.0000 mg | DELAYED_RELEASE_TABLET | Freq: Every day | ORAL | Status: DC
  Administered 2020-10-31 – 2020-11-01 (×2): 40 mg via ORAL
  Filled 2020-10-30 (×2): qty 1

## 2020-10-30 MED ORDER — FUROSEMIDE 10 MG/ML IJ SOLN
40.0000 mg | Freq: Every day | INTRAMUSCULAR | Status: AC
  Administered 2020-10-31: 40 mg via INTRAVENOUS
  Filled 2020-10-30: qty 4

## 2020-10-30 MED ORDER — HYDROCOD POLST-CPM POLST ER 10-8 MG/5ML PO SUER: 5.0000 mL | Freq: Two times a day (BID) | ORAL | Status: DC | PRN

## 2020-10-30 MED ORDER — CEFTRIAXONE SODIUM 2 G IJ SOLR
2.0000 g | INTRAMUSCULAR | Status: DC
Start: 2020-10-30 — End: 2020-11-01
  Administered 2020-10-30 – 2020-11-01 (×3): 2 g via INTRAVENOUS
  Filled 2020-10-30: qty 2
  Filled 2020-10-30 (×3): qty 20

## 2020-10-30 MED ORDER — IPRATROPIUM BROMIDE 0.02 % IN SOLN: 0.5000 mg | RESPIRATORY_TRACT | Status: DC | PRN

## 2020-10-30 MED ORDER — LEVOTHYROXINE SODIUM 50 MCG PO TABS
50.0000 ug | ORAL_TABLET | Freq: Every day | ORAL | Status: DC
  Administered 2020-10-31 – 2020-11-01 (×2): 50 ug via ORAL
  Filled 2020-10-30 (×2): qty 1

## 2020-10-30 MED ORDER — HYDROCODONE-ACETAMINOPHEN 5-325 MG PO TABS: 1.0000 | ORAL_TABLET | ORAL | Status: DC | PRN

## 2020-10-30 MED ORDER — ACETAMINOPHEN 325 MG PO TABS: 650.0000 mg | ORAL_TABLET | Freq: Four times a day (QID) | ORAL | Status: DC | PRN

## 2020-10-30 MED ORDER — FUROSEMIDE 10 MG/ML IJ SOLN
40.0000 mg | Freq: Every day | INTRAMUSCULAR | Status: DC
Start: 2020-10-31 — End: 2020-10-30

## 2020-10-30 MED ORDER — AZITHROMYCIN 500 MG IV SOLR
500.0000 mg | INTRAVENOUS | Status: DC
  Administered 2020-10-30 – 2020-11-01 (×3): 500 mg via INTRAVENOUS
  Filled 2020-10-30 (×4): qty 500

## 2020-10-30 NOTE — ED Notes (Signed)
Lab contacted to draw blood cultures and lactic.

## 2020-10-30 NOTE — ED Triage Notes (Signed)
Pt here via [facility] from home with SOB and weakness for the past few days,. Pt takes cardiazem at home. Pt also c/o of her left foot being swollen. Pt denies CP. Pt does not wear oxygen at home but was placed oon 2L by ems. 117/62, 94% BNC on 2L, and 34 RR.

## 2020-10-30 NOTE — Consult Note (Signed)
[facility] Cardiology  CARDIOLOGY CONSULT NOTE  Patient ID: Frances Kent MRN: [id number] DOB/AGE: [birth year] 87 y.o.  Admit date: 10/30/2020 Referring Physician [name] Primary Physician [facility] Primary Cardiologist [name] Reason for Consultation acute on chronic systolic congestive heart failure  HPI: 87 year old female referred for evaluation of acute on chronic systolic congestive heart failure and atrial fibrillation with rapid ventricular rate.  Patient has known history of atrial fibrillation, on Xarelto for stroke prevention.  She was in her usual state of health until approximately 1 to 2 weeks ago when she noted increasing fluid retention, peripheral edema, with shortness of breath.  She presented to emergency room where wrist x-ray revealed mild congestive heart failure.  The patient was noted to be in atrial fibrillation with rapid ventricular rate.  ECG revealed atrial fibrillation at a rate of 130 bpm.  The patient denies chest pain, palpitations or heart racing.  Patient treated with furosemide 40 mg IV with diuresis and overall clinical improvement.  She was started on diltiazem drip.  Telemetry reveals atrial fibrillation at a rate of 99 bpm.  Admission labs notable for BNP 1073.7.  High-sensitivity troponin was 19.  Review of systems complete and found to be negative unless listed above     Past Medical History:  Diagnosis Date   Atrial fibrillation ([place])    Benign breast cyst in female, left 10/07/2016   Colon adenomas    GERD (gastroesophageal reflux disease)    Hypertension    Hypothyroidism    Macular degeneration    Mitral regurgitation    Pulmonary embolism ([place]) 02/2016   SVT (supraventricular tachycardia) ([place])     Past Surgical History:  Procedure Laterality Date   APPENDECTOMY  1960   BREAST CYST ASPIRATION Left 2017   CARDIOVERSION N/A 04/05/2016   Procedure: Cardioversion;  Surgeon: [name], MD;  Location: [facility] ORS;  Service: Cardiovascular;   Laterality: N/A;   CHOLECYSTECTOMY  1985   ESOPHAGOGASTRODUODENOSCOPY (EGD) WITH PROPOFOL N/A 11/27/2019   Procedure: ESOPHAGOGASTRODUODENOSCOPY (EGD) WITH PROPOFOL;  Surgeon: Lesly Rubenstein, MD;  Location: [facility] ENDOSCOPY;  Service: Endoscopy;  Laterality: N/A;   ESOPHAGOGASTRODUODENOSCOPY (EGD) WITH PROPOFOL N/A 12/24/2019   Procedure: ESOPHAGOGASTRODUODENOSCOPY (EGD) WITH PROPOFOL;  Surgeon: Lesly Rubenstein, MD;  Location: [facility] ENDOSCOPY;  Service: Endoscopy;  Laterality: N/A;   OVARIAN CYST REMOVAL     TEE WITHOUT CARDIOVERSION N/A 02/01/2018   Procedure: TRANSESOPHAGEAL ECHOCARDIOGRAM (TEE);  Surgeon: [name], MD;  Location: [facility] ORS;  Service: Cardiovascular;  Laterality: N/A;    (Not in a hospital admission)  Social History   Socioeconomic History   Marital status: Widowed    Spouse name: Not on file   Number of children: Not on file   Years of education: Not on file   Highest education level: Not on file  Occupational History   Not on file  Tobacco Use   Smoking status: Never   Smokeless tobacco: Never   Tobacco comments:    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 , worked at [employer], Development worker, 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 used  Substance and Sexual Activity   Alcohol use: No   Drug use: No   Sexual activity: Not Currently  Other Topics Concern   Not on file  Social History Narrative   Has caretaking responsibility for 3 young grandchildren who live with her   Social Determinants of Health   Financial Resource Strain: Not on file  Food Insecurity: Not on file  Transportation Needs: Not on file  Physical Activity: Not on file  Stress: Not on file  Social Connections: Not on file  Intimate Partner Violence: Not on file    Family History  Problem Relation Age of Onset   Cancer Mother 53       breast cancer, lived to 29,    Breast cancer Mother 78   Cancer Son 52       pancreatic cancer   Heart disease Son        CAD, Tobacco Abuse    Heart disease  Maternal Grandmother 80       died of massive MI   Stroke Sister       Review of systems complete and found to be negative unless listed above      PHYSICAL EXAM  General: Well developed, well nourished, in no acute distress HEENT:  Normocephalic and atramatic Neck:  No JVD.  Lungs: Clear bilaterally to auscultation and percussion. Heart: HRRR . Normal S1 and S2 without gallops or murmurs.  Abdomen: Bowel sounds are positive, abdomen soft and non-tender  Msk:  Back normal, normal gait. Normal strength and tone for age. Extremities: No clubbing, cyanosis or edema.   Neuro: Alert and oriented X 3. Psych:  Good affect, responds appropriately  Labs:   Lab Results  Component Value Date   WBC 10.6 (H) 10/30/2020   HGB 11.4 (L) 10/30/2020   HCT 36.0 10/30/2020   MCV 87.6 10/30/2020   PLT 507 (H) 10/30/2020    Recent Labs  Lab 10/30/20 0939  NA 138  K 4.2  CL 106  CO2 20*  BUN 18  CREATININE 0.91  CALCIUM 8.4*  PROT 6.8  BILITOT 1.8*  ALKPHOS 89  ALT 15  AST 24  GLUCOSE 120*   Lab Results  Component Value Date   CKTOTAL 83 05/09/2018   TROPONINI <0.03 02/25/2018    Lab Results  Component Value Date   CHOL 284 (H) 12/11/2019   CHOL 208 (H) 08/31/2018   CHOL 264 (H) 07/24/2017   Lab Results  Component Value Date   HDL 100.10 12/11/2019   HDL 72.40 08/31/2018   HDL 73.60 07/24/2017   Lab Results  Component Value Date   LDLCALC 166 (H) 12/11/2019   LDLCALC 116 (H) 08/31/2018   LDLCALC 170 (H) 07/24/2017   Lab Results  Component Value Date   TRIG 91.0 12/11/2019   TRIG 94.0 08/31/2018   TRIG 103.0 07/24/2017   Lab Results  Component Value Date   CHOLHDL 3 12/11/2019   CHOLHDL 3 08/31/2018   CHOLHDL 4 07/24/2017   Lab Results  Component Value Date   LDLDIRECT 185.3 03/27/2013   LDLDIRECT 156.1 03/23/2012   LDLDIRECT 181.4 01/17/2011      Radiology: DG Chest 2 View  Result Date: 10/15/2020 CLINICAL DATA:  Atrial fibrillation EXAM: CHEST -  2 VIEW COMPARISON:  09/08/2020, chest CT 10/05/2020 FINDINGS: Mild interstitial prominence. Hazy density bilaterally could reflect the ground-glass opacities seen on prior CT. There is no pleural effusion or pneumothorax. Left atrial enlargement. No acute osseous abnormality. IMPRESSION: Mild interstitial prominence. Hazy density bilaterally could reflect ground-glass opacity seen on prior CT. May be infectious/inflammatory. Left atrial enlargement. Electronically Signed   By: Macy Mis M.D.   On: 10/15/2020 15:20   CT Chest High Resolution  Result Date: 10/05/2020 CLINICAL DATA:  87 year old female with history of persistent wet cough. Evaluate for interstitial lung disease. EXAM: CT CHEST WITHOUT CONTRAST TECHNIQUE: Multidetector CT imaging of the chest was performed following  the standard protocol without intravenous contrast. High resolution imaging of the lungs, as well as inspiratory and expiratory imaging, was performed. COMPARISON:  Chest CTA 04/02/2020. FINDINGS: Cardiovascular: Heart size is mildly enlarged with left atrial dilatation. There is no significant pericardial fluid, thickening or pericardial calcification. There is aortic atherosclerosis, as well as atherosclerosis of the great vessels of the mediastinum and the coronary arteries, including calcified atherosclerotic plaque in the left anterior descending and right coronary arteries. Mild calcifications of the aortic valve. Severe calcifications of the mitral valve and annulus. Mediastinum/Nodes: There are multiple prominent but non pathologically enlarged mediastinal and bilateral hilar lymph nodes, nonspecific. Esophagus is unremarkable in appearance. No axillary lymphadenopathy. Lungs/Pleura: High-resolution images demonstrates some patchy areas of ground-glass attenuation and septal thickening scattered throughout the lungs bilaterally. There is no discernible craniocaudal gradient. In addition, there are some more dense areas of  apparent peribronchovascular ground-glass attenuation and nodular consolidation, most evident in the basal segments of the lower lobes of the lungs bilaterally with there are also some regional areas of architectural distortion and volume loss. Scattered areas of mild cylindrical bronchiectasis are noted. No honeycombing. Inspiratory and expiratory imaging demonstrates some mild air trapping indicative of mild small airways disease. No pleural effusions. Upper Abdomen: Aortic atherosclerosis.  Status post cholecystectomy. Musculoskeletal: There are no aggressive appearing lytic or blastic lesions noted in the visualized portions of the skeleton. IMPRESSION: 1. The appearance of the lungs is most suggestive of an alternative diagnosis (not usual interstitial pneumonia) per current ATS guidelines. Findings are unusual and nonspecific, but favored to reflect an underlying infectious process. If there is persistent clinical concern for interstitial lung disease, repeat high-resolution chest CT should be considered in 6-12 months to assess for temporal changes in the appearance of the lung parenchyma. 2. Cardiomegaly with left atrial dilatation. 3. Aortic atherosclerosis, in addition to 2 vessel coronary artery disease. 4. There are calcifications of the aortic valve and mitral valve/annulus. Echocardiographic correlation for evaluation of potential valvular dysfunction may be warranted if clinically indicated. Aortic Atherosclerosis (ICD10-I70.0). Electronically Signed   By: Vinnie Langton M.D.   On: 10/05/2020 12:06   DG Chest Port 1 View  Result Date: 10/30/2020 CLINICAL DATA:  Shortness of breath and weakness. EXAM: PORTABLE CHEST 1 VIEW COMPARISON:  10/15/2020 FINDINGS: Numerous leads and wires project over the chest. Patient rotated minimally left. Midline trachea. Mild cardiomegaly. Small left pleural effusion. No pneumothorax. Interstitial prominence and indistinctness is increased. Left greater than right  base airspace disease. IMPRESSION: Cardiomegaly with mild congestive heart failure. Small left pleural effusion with bibasilar airspace disease. This could represent atelectasis or, especially at the left lung base, infection. Electronically Signed   By: Abigail Miyamoto M.D.   On: 10/30/2020 10:12    EKG: Atrial fibrillation at 130 bpm  ASSESSMENT AND PLAN:   1.  Acute on chronic systolic congestive heart failure, BNP 1073.7, clinical improvement noted after initial diuresis with furosemide 40 mg IV 2.  Atrial fibrillation with rapid ventricular rate, in the setting of acute on chronic systolic congestive heart failure, on diltiazem drip, heart rate improved to 99 bpm, on Xarelto for stroke prevention, and amiodarone for rate and rhythm control  Recommendations  1.  Agree with overall current therapy 2.  Continue diuresis 3.  Carefully monitor renal status 4.  Continue Xarelto for stroke prevention 5.  Continue diltiazem drip for now 6.  Continue amiodarone 200 mg daily for rate and rhythm control 7.  Further recommendations pending patient's clinical  course  Signed: Isaias Cowman MD,PhD, [facility] 10/30/2020, 3:56 PM

## 2020-10-30 NOTE — ED Notes (Signed)
Pt given a warm blanket 

## 2020-10-30 NOTE — ED Provider Notes (Signed)
[facility] Emergency Department Provider Note   ____________________________________________    I have reviewed the triage vital signs and the nursing notes.   HISTORY  Chief Complaint Weakness and Shortness of Breath     HPI Frances Kent is a 87 y.o. female who presents with complaints of shortness of breath, fatigue, palpitations.  EMS reports room air oxygen saturation of 90% on their arrival.  She denies chest pain.  She reports compliance with her medications.  She does report some swelling of her ankles bilaterally.  No calf pain or swelling.  Does have a history of atrial fibrillation.  Review of medical records demonstrates the patient was discharged on the second of this month after admission for atrial fibrillation and diarrhea, notably the patient does have follow-up with pulmonology for long-term postviral bronchitis/fibrosis  Past Medical History:  Diagnosis Date   Atrial fibrillation ([place])    Benign breast cyst in female, left 10/07/2016   Colon adenomas    GERD (gastroesophageal reflux disease)    Hypertension    Hypothyroidism    Macular degeneration    Mitral regurgitation    Pulmonary embolism ([place]) 02/2016   SVT (supraventricular tachycardia) ([place])     Patient Active Problem List   Diagnosis Date Noted   A-fib ([place]) 10/16/2020   Antibiotic-associated diarrhea 10/15/2020   ILD (interstitial lung disease) ([place]) 09/22/2020   Tachyarrhythmia 09/08/2020   Sick sinus syndrome ([place]) 09/08/2020   Elevated serum free T4 level 09/08/2020   Purpura senilis ([place]) 08/28/2020   Hiatal hernia 08/09/2020   Atrial fibrillation with rapid ventricular response ([place]) 08/06/2020   Thrombophilia ([place]) 08/06/2020   History of COVID-19 03/03/2020   Schatzki's ring of distal esophagus 12/11/2019   Erosive gastritis 11/30/2019   Dysphagia 11/27/2019   Painful swallowing 11/27/2019   Gastroesophageal reflux disease 11/27/2019   Stage 3a  chronic kidney disease ([place]) 04/11/2019   Coagulopathy ([place]) 09/12/2018   Essential hypertension 09/12/2018   CHF (congestive heart failure) ([place]) 08/15/2018   Hypothyroidism due to acquired atrophy of thyroid 05/08/2018   Unintentional weight loss 05/08/2018   Myalgia due to statin 05/08/2018   Abdominal aortic atherosclerosis ([place]) 05/07/2018   Bradycardia 04/28/2018   Pleural effusion on left 02/27/2018   Moderate mitral stenosis 01/10/2018   Lumbar radiculitis 01/07/2018   B12 deficiency 07/26/2017   Atrial fibrillation status post cardioversion ([place]) 04/06/2016   Hospital discharge follow-up 03/10/2016   Vitamin D deficiency 04/08/2015   Cervical spine degeneration 12/13/2014   History of pulmonary embolism 12/02/2014   Insomnia 10/04/2014   Moderate tricuspid insufficiency 09/17/2014   Generalized anxiety disorder 03/28/2013   Mitral valve prolapse 03/28/2013   Routine adult health maintenance 03/23/2012   Fatigue 03/23/2012   Cough 04/17/2011   Screening for breast cancer 11/29/2010   Macular degeneration, left eye 11/29/2010   Screening for colon cancer 11/29/2010   Hyperlipidemia LDL goal <160 11/29/2010   GERD (gastroesophageal reflux disease)     Past Surgical History:  Procedure Laterality Date   APPENDECTOMY  1960   BREAST CYST ASPIRATION Left 2017   CARDIOVERSION N/A 04/05/2016   Procedure: Cardioversion;  Surgeon: [name], MD;  Location: [facility] ORS;  Service: Cardiovascular;  Laterality: N/A;   CHOLECYSTECTOMY  1985   ESOPHAGOGASTRODUODENOSCOPY (EGD) WITH PROPOFOL N/A 11/27/2019   Procedure: ESOPHAGOGASTRODUODENOSCOPY (EGD) WITH PROPOFOL;  Surgeon: Lesly Rubenstein, MD;  Location: [facility] ENDOSCOPY;  Service: Endoscopy;  Laterality: N/A;   ESOPHAGOGASTRODUODENOSCOPY (EGD) WITH PROPOFOL N/A  12/24/2019   Procedure: ESOPHAGOGASTRODUODENOSCOPY (EGD) WITH PROPOFOL;  Surgeon: Lesly Rubenstein, MD;  [facility] ENDOSCOPY;  Service: Endoscopy;  Laterality:  N/A;   OVARIAN CYST REMOVAL     TEE WITHOUT CARDIOVERSION N/A 02/01/2018   Procedure: TRANSESOPHAGEAL ECHOCARDIOGRAM (TEE);  Surgeon: [name], MD;  Location: [facility] ORS;  Service: Cardiovascular;  Laterality: N/A;    Prior to Admission medications   Medication Sig Start Date End Date Taking? Authorizing Provider  acetaminophen (TYLENOL) 500 MG tablet Take 500 mg by mouth every 6 (six) hours as needed.   Yes [provider]  ALPRAZolam (XANAX) 0.25 MG tablet Take 1 tablet (0.25 mg total) by mouth at bedtime as needed for anxiety or sleep. 08/26/20  Yes [name], MD  amiodarone (PACERONE) 200 MG tablet Take 200 mg by mouth daily.   Yes [provider]  benzonatate (TESSALON) 200 MG capsule Take 1 capsule (200 mg total) by mouth 3 (three) times daily as needed for cough. 09/22/20  Yes [name], MD  chlorpheniramine-HYDROcodone (TUSSIONEX) 10-8 MG/5ML SUER Take 5 mLs by mouth every 12 (twelve) hours as needed for cough. 10/16/20  Yes Florencia Reasons, MD  diltiazem (CARDIZEM) 30 MG tablet Take 1 tablet (30 mg total) by mouth every 8 (eight) hours. Patient taking differently: Take 30 mg by mouth 2 (two) times daily. 10/16/20  Yes Florencia Reasons, MD  esomeprazole (NEXIUM) 40 MG capsule Take 1 capsule (40 mg total) by mouth daily. 09/22/20  Yes [name], MD  furosemide (LASIX) 40 MG tablet Take 40 mg by mouth daily as needed for fluid.   Yes [provider]  ipratropium (ATROVENT) 0.02 % nebulizer solution Take 2.5 mLs (0.5 mg total) by nebulization every 4 (four) hours as needed for wheezing or shortness of breath (cough). 10/16/20  Yes Florencia Reasons, MD  levothyroxine (EUTHYROX) 50 MCG tablet TAKE 1 TABLET BY MOUTH ONCE DAILY 30  MINUTES  PRIOR  TO  EATING; 09/11/20  Yes [name], MD  potassium chloride SA (KLOR-CON) 20 MEQ tablet Take 20 mEq by mouth daily. 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  ENT: No sore throat. Cardiovascular: As above Respiratory: Positive for shortness of breath Gastrointestinal: No abdominal pain.  No nausea, no vomiting.   Genitourinary: Negative for dysuria. Musculoskeletal: Mild swelling in the ankle Skin: Negative for rash. Neurological: Negative for headaches or weakness   ____________________________________________   PHYSICAL EXAM:  VITAL SIGNS: ED Triage Vitals  Enc Vitals Group     BP      Pulse      Resp      Temp      Temp src      SpO2      Weight      Height      Head Circumference      Peak Flow      Pain Score      Pain Loc      Pain Edu?      Excl. in [place]?     Constitutional: Alert and oriented. Pleasant and interactive Eyes: Conjunctivae are normal.  Head: Atraumatic. Nose: No congestion/rhinnorhea. Mouth/Throat: Mucous membranes are moist.   Neck:  Painless ROM Cardiovascular: Tachycardia, regular rhythm.  Good peripheral circulation. Respiratory: Increased respiratory effort with tachypnea, no retractions, bibasilar [place], no wheezing Gastrointestinal: Soft and nontender. No distention.   Genitourinary: deferred Musculoskeletal: Mild swelling in the feet bilaterally warm and well perfused Neurologic:  Normal speech and  language. No gross focal neurologic deficits are appreciated.  Skin:  Skin is warm, dry and intact. No rash noted. Psychiatric: Mood and affect are normal. Speech and behavior are normal.  ____________________________________________   LABS (all labs ordered are listed, but only abnormal results are displayed)  Labs Reviewed  CBC - Abnormal; Notable for the following components:      Result Value   WBC 10.6 (*)    Hemoglobin 11.4 (*)    RDW 18.4 (*)    Platelets 507 (*)    All other components within normal limits  COMPREHENSIVE METABOLIC PANEL - Abnormal; Notable for the following components:   CO2 20 (*)    Glucose, Bld 120 (*)    Calcium 8.4 (*)    Albumin 3.3 (*)    Total Bilirubin 1.8 (*)    All other components within normal limits  BRAIN NATRIURETIC PEPTIDE - Abnormal; Notable for the following components:   B Natriuretic Peptide 1,073.7 (*)    All other components within normal limits  TROPONIN I (HIGH SENSITIVITY) - Abnormal; Notable for the following components:   Troponin I (High Sensitivity) 19 (*)    All other components within normal limits  CULTURE, BLOOD (ROUTINE X 2)  CULTURE, BLOOD (ROUTINE X 2)  LACTIC ACID, PLASMA  LACTIC ACID, PLASMA   ____________________________________________  EKG  ED ECG REPORT I, Lavonia Drafts, the attending physician, personally viewed and interpreted this ECG.  Date: 10/30/2020  Rhythm: Atrial fibrillation with RVR QRS Axis: normal Intervals: Abnormal ST/T Wave abnormalities: normal Narrative Interpretation: A. fib with RVR  ____________________________________________  RADIOLOGY  Chest x-ray viewed by me, consistent with CHF possible infiltrate ____________________________________________   PROCEDURES  Procedure(s) performed: No  Procedures   Critical Care performed: yes  CRITICAL CARE Performed by: Lavonia Drafts   Total critical care time: 30 minutes  Critical care time was exclusive of separately  billable procedures and treating other patients.  Critical care was necessary to treat or prevent imminent or life-threatening deterioration.  Critical care was time spent personally by me on the following activities: development of treatment plan with patient and/or surrogate as well as nursing, discussions with consultants, evaluation of patient's response to treatment, examination of patient, obtaining history from patient or surrogate, ordering and performing treatments and interventions, ordering and review of laboratory studies, ordering and review of radiographic studies, pulse oximetry and re-evaluation of patient's condition.  ____________________________________________   INITIAL IMPRESSION / ASSESSMENT AND  PLAN / ED COURSE  Pertinent labs & imaging results that were available during my care of the patient were reviewed by me and considered in my medical decision making (see chart for details).   Patient presents with shortness of breath, weakness fatigue found to be in A. fib with RVR.  She does have a history of A. fib, she is on Xarelto.  She does not think that she has had fevers but does feel diffusely weak.  Review of medical records demonstrates recent admission for similar as detailed above.  Patient treated with IV Cardizem bolus and infusion which has controlled her rate  Chest x-ray reviewed by me, suspicious for CHF with interstitial edema as well as possible infiltrate worse from prior, will cover with IV antibiotics, treat with IV Lasix  Patient will require admission to the hospitalist service    ____________________________________________   FINAL CLINICAL IMPRESSION(S) / ED DIAGNOSES  Final diagnoses:  Shortness of breath  Atrial fibrillation with RVR ([place])  Community acquired pneumonia, unspecified laterality  Acute congestive heart failure, unspecified heart failure type ([place])        Note:  This document was prepared using Dragon voice recognition  software and may include unintentional dictation errors.    Lavonia Drafts, MD 10/30/20 1125

## 2020-10-30 NOTE — H&P (Signed)
History and Physical    Frances Kent [id number] DOB: October 06, 1930 DOA: 10/30/2020  PCP: [name], MD  Chief Complaint: Shortness of breath, severe fatigue, heart palpitations  HPI: Frances Kent is a 87 y.o. female with a past medical history of chronic atrial fibrillation on Xarelto, anxiety, GERD, hypothyroidism, history of SVT, history of PE.  She presents via EMS with shortness of breath, severe fatigue/generalized weakness of heart palpitations which has been going on for a while but worsened over the last 24 hours.  She endorses bilateral lower extremity edema.  Has a chronic cough but better as of late.  Upon EMS arrival she was saturating 91%.  She has been referred to local pulmonology for possible postviral bronchitis/fibrosis.  She denies any fevers or chills.  She is on 3 L currently and saturating well.  Currently on a diltiazem drip.  RVR improved.  Remains in A. fib.  ED Course: Chest x-ray shows possible left lower lobe pneumonia and mild CHF exacerbation with small left pleural effusion.  Lab work was obtained and showed a WBC of 10.6 and hemoglobin 11.4.  BNP was slightly elevated from her baseline of 900 to 1073.  EKG shows A. fib with a rate of 130.  Has been given ceftriaxone and Zithromax for pneumonia coverage in the emergency department.  Also given Lasix 40 mg IV x1.  Was given a Cardizem bolus in the emergency department and started on a Cardizem drip.  Review of Systems: 14 point review of systems is negative except for what is mentioned above in the HPI.   Past Medical History:  Diagnosis Date   Atrial fibrillation ([place])    Benign breast cyst in female, left 10/07/2016   Colon adenomas    GERD (gastroesophageal reflux disease)    Hypertension    Hypothyroidism    Macular degeneration    Mitral regurgitation    Pulmonary embolism ([place]) 02/2016   SVT (supraventricular tachycardia) ([place])     Past Surgical History:  Procedure Laterality Date    APPENDECTOMY  1960   BREAST CYST ASPIRATION Left 2017   CARDIOVERSION N/A 04/05/2016   Procedure: Cardioversion;  Surgeon: [name], MD;  Location: [facility] ORS;  Service: Cardiovascular;  Laterality: N/A;   CHOLECYSTECTOMY  1985   ESOPHAGOGASTRODUODENOSCOPY (EGD) WITH PROPOFOL N/A 11/27/2019   Procedure: ESOPHAGOGASTRODUODENOSCOPY (EGD) WITH PROPOFOL;  Surgeon: Lesly Rubenstein, MD;  Location: [facility] ENDOSCOPY;  Service: Endoscopy;  Laterality: N/A;   ESOPHAGOGASTRODUODENOSCOPY (EGD) WITH PROPOFOL N/A 12/24/2019   Procedure: ESOPHAGOGASTRODUODENOSCOPY (EGD) WITH PROPOFOL;  Surgeon: Lesly Rubenstein, MD;  Location: [facility] ENDOSCOPY;  Service: Endoscopy;  Laterality: N/A;   OVARIAN CYST REMOVAL     TEE WITHOUT CARDIOVERSION N/A 02/01/2018   Procedure: TRANSESOPHAGEAL ECHOCARDIOGRAM (TEE);  Surgeon: [name], MD;  Location: [facility] ORS;  Service: Cardiovascular;  Laterality: N/A;    Social History   Socioeconomic History   Marital status: Widowed    Spouse name: Not on file   Number of children: Not on file   Years of education: Not on file   Highest education level: Not on file  Occupational History   Not on file  Tobacco Use   Smoking status: Never   Smokeless tobacco: Never   Tobacco comments:    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 , worked at [employer], Development worker, 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 used  Substance and Sexual Activity   Alcohol use: No   Drug use: No   Sexual activity: Not  Currently  Other Topics Concern   Not on file  Social History Narrative   Has caretaking responsibility for 3 young grandchildren who live with her   Social Determinants of Health   Financial Resource Strain: Not on file  Food Insecurity: Not on file  Transportation Needs: Not on file  Physical Activity: Not on file  Stress: Not on file  Social Connections: Not on file  Intimate Partner Violence: Not on file    Allergies  Allergen Reactions   Statins     Severe myalgias    Family  History  Problem Relation Age of Onset   Cancer Mother 81       breast cancer, lived to 85,    Breast cancer Mother 38   Cancer Son 10       pancreatic cancer   Heart disease Son        CAD, Tobacco Abuse    Heart disease Maternal Grandmother 80       died of massive MI   Stroke Sister     Prior to Admission medications   Medication Sig Start Date End Date Taking? Authorizing Provider  acetaminophen (TYLENOL) 500 MG tablet Take 500 mg by mouth every 6 (six) hours as needed.   Yes [provider]  ALPRAZolam (XANAX) 0.25 MG tablet Take 1 tablet (0.25 mg total) by mouth at bedtime as needed for anxiety or sleep. 08/26/20  Yes [name], MD  amiodarone (PACERONE) 200 MG tablet Take 200 mg by mouth daily.   Yes [provider]  benzonatate (TESSALON) 200 MG capsule Take 1 capsule (200 mg total) by mouth 3 (three) times daily as needed for cough. 09/22/20  Yes [name], MD  chlorpheniramine-HYDROcodone (TUSSIONEX) 10-8 MG/5ML SUER Take 5 mLs by mouth every 12 (twelve) hours as needed for cough. 10/16/20  Yes Florencia Reasons, MD  diltiazem (CARDIZEM) 30 MG tablet Take 1 tablet (30 mg total) by mouth every 8 (eight) hours. Patient taking differently: Take 30 mg by mouth 2 (two) times daily. 10/16/20  Yes Florencia Reasons, MD  esomeprazole (NEXIUM) 40 MG capsule Take 1 capsule (40 mg total) by mouth daily. 09/22/20  Yes [name], MD  furosemide (LASIX) 40 MG tablet Take 40 mg by mouth daily as needed for fluid.   Yes [provider]  ipratropium (ATROVENT) 0.02 % nebulizer solution Take 2.5 mLs (0.5 mg total) by nebulization every 4 (four) hours as needed for wheezing or shortness of breath (cough). 10/16/20  Yes Florencia Reasons, MD  levothyroxine (EUTHYROX) 50 MCG tablet TAKE 1 TABLET BY MOUTH ONCE DAILY 30  MINUTES  PRIOR  TO  EATING; 09/11/20  Yes [name], MD  potassium chloride SA (KLOR-CON) 20 MEQ tablet Take 20 mEq by mouth daily. 10/26/20  Yes [provider]   XARELTO 15 MG TABS tablet TAKE 1 TABLET EVERY DAY 08/13/20  Yes [name], MD    Physical Exam: Vitals:   10/30/20 1030 10/30/20 1145 10/30/20 1200 10/30/20 1330  BP: 113/79 136/86 (!) 128/94 (!) 86/71  Pulse: 97 (!) 111 (!) 108 (!) 108  Resp: (!) 23 (!) 21  (!) 25  Temp:      TempSrc:      SpO2: 100% 100% 100% 91%  Weight:      Height:         General:  Appears calm and comfortable and is in NAD Cardiovascular: Irregular rhythm with regular rate. Respiratory:   CTA bilaterally  with no wheezes/rales/rhonchi.  Normal respiratory effort. Abdomen:  soft, NT, ND, NABS Skin:  no rash or induration seen on limited exam Musculoskeletal:  grossly normal tone BUE/BLE, good ROM, no bony abnormality Lower extremity: 2+ pitting pedal edema, 1+ and pitting edema above the ankles.  Limited foot exam with no ulcerations.  2+ distal pulses. Psychiatric:  grossly normal mood and affect, speech fluent and appropriate, AOx3 Neurologic:  CN 2-12 grossly intact, moves all extremities in coordinated fashion, sensation intact    Radiological Exams on Admission: Independently reviewed - see discussion in A/P where applicable  DG Chest Port 1 View  Result Date: 10/30/2020 CLINICAL DATA:  Shortness of breath and weakness. EXAM: PORTABLE CHEST 1 VIEW COMPARISON:  10/15/2020 FINDINGS: Numerous leads and wires project over the chest. Patient rotated minimally left. Midline trachea. Mild cardiomegaly. Small left pleural effusion. No pneumothorax. Interstitial prominence and indistinctness is increased. Left greater than right base airspace disease. IMPRESSION: Cardiomegaly with mild congestive heart failure. Small left pleural effusion with bibasilar airspace disease. This could represent atelectasis or, especially at the left lung base, infection. Electronically Signed   By: Abigail Miyamoto M.D.   On: 10/30/2020 10:12    EKG: Independently reviewed.  A. fib with a rate 130 bpm.   Labs on Admission: I  have personally reviewed the available labs and imaging studies at the time of the admission.  Pertinent labs: WBC 10.6, hemoglobin 11.4, troponin 19, BNP 1073, chloride 20, blood glucose 120, T bili 1.8   Assessment/Plan: A. fib with RVR: Admit to the progressive cardiac unit.  Currently on diltiazem drip.  Consult cardiology.  Continue home amiodarone and Xarelto. Holding home diltiazem.  Wean drip as per protocol.  Mild CHF exacerbation: Per clinically, radiographically and chemically.  On exam with peripheral edema and mild CHF on chest x-ray as well as  BNP elevated at 1073.  Appears her baseline is 900.  Given Lasix 40 mg IV x1 in the emergency department.  Diuresing well.  Continue strict I's and O's, daily weights.  Give another dose of IV Lasix tomorrow.  Can transition back to home Lasix the day after as needed. Obtain ECHO.  Hypothyroidism: Well-controlled with TSH within normal limits.  Continue home Euthyrox.  GERD: Continue home Nexium  Chronic hypokalemia: Continue home Klor-Con  Anxiety: Continue home Xanax  History of PE: Continue home Xarelto  Level of Care: Progressive cardiac DVT prophylaxis: Xarelto Code Status: Full code Consults: Cardiology Admission status: Inpatient   Leslee Home DO [facility]   How to contact the [facility] Attending or Consulting provider [place] or covering provider during after hours [place], for this patient?  Check the care team in [facility] and look for a) attending/consulting TR[facility] provider listed and b) the [facility] team listed Log into [email or web address] and use [place]'s universal password to access. If you do not have the password, please contact the hospital operator. Locate the [facility] provider you are looking for under [facility] and page to a number that you can be directly reached. If you still have difficulty reaching the provider, please page the [facility] (Director on Call) for the Hospitalists listed on amion for  assistance.   10/30/2020, 1:33 PM

## 2020-10-30 NOTE — ED Notes (Signed)
Lab at bedside

## 2020-10-30 NOTE — ED Notes (Signed)
Smaller BP cuff placed on PT.

## 2020-10-31 ENCOUNTER — Inpatient Hospital Stay: Payer: Medicare HMO

## 2020-10-31 ENCOUNTER — Encounter: Payer: Self-pay | Admitting: Family Medicine

## 2020-10-31 DIAGNOSIS — A419 Sepsis, unspecified organism: Secondary | ICD-10-CM

## 2020-10-31 DIAGNOSIS — I5023 Acute on chronic systolic (congestive) heart failure: Secondary | ICD-10-CM

## 2020-10-31 DIAGNOSIS — E039 Hypothyroidism, unspecified: Secondary | ICD-10-CM

## 2020-10-31 DIAGNOSIS — I4821 Permanent atrial fibrillation: Principal | ICD-10-CM

## 2020-10-31 DIAGNOSIS — R6520 Severe sepsis without septic shock: Secondary | ICD-10-CM

## 2020-10-31 DIAGNOSIS — J189 Pneumonia, unspecified organism: Secondary | ICD-10-CM

## 2020-10-31 LAB — CBC
HCT: 33.4 % — ABNORMAL LOW (ref 36.0–46.0)
Hemoglobin: 10.8 g/dL — ABNORMAL LOW (ref 12.0–15.0)
MCH: 28.3 pg (ref 26.0–34.0)
MCHC: 32.3 g/dL (ref 30.0–36.0)
MCV: 87.7 fL (ref 80.0–100.0)
Platelets: 434 10*3/uL — ABNORMAL HIGH (ref 150–400)
RBC: 3.81 MIL/uL — ABNORMAL LOW (ref 3.87–5.11)
RDW: 17.9 % — ABNORMAL HIGH (ref 11.5–15.5)
WBC: 8.3 10*3/uL (ref 4.0–10.5)
nRBC: 0 % (ref 0.0–0.2)

## 2020-10-31 LAB — BASIC METABOLIC PANEL
Anion gap: 7 (ref 5–15)
BUN: 18 mg/dL (ref 8–23)
CO2: 28 mmol/L (ref 22–32)
Calcium: 8.3 mg/dL — ABNORMAL LOW (ref 8.9–10.3)
Chloride: 102 mmol/L (ref 98–111)
Creatinine, Ser: 1.03 mg/dL — ABNORMAL HIGH (ref 0.44–1.00)
GFR, Estimated: 52 mL/min — ABNORMAL LOW (ref >60)
Glucose, Bld: 99 mg/dL (ref 70–99)
Potassium: 3.6 mmol/L (ref 3.5–5.1)
Sodium: 137 mmol/L (ref 135–145)

## 2020-10-31 LAB — LACTIC ACID, PLASMA
Lactic Acid, Venous: 2.8 mmol/L (ref 0.5–1.9)
Lactic Acid, Venous: 2.5 mmol/L (ref 0.5–1.9)

## 2020-10-31 LAB — PROCALCITONIN: Procalcitonin: 0.11 ng/mL

## 2020-10-31 LAB — MAGNESIUM: Magnesium: 2.1 mg/dL (ref 1.7–2.4)

## 2020-10-31 LAB — SARS CORONAVIRUS 2 (TAT 6-24 HRS): SARS Coronavirus 2: NEGATIVE

## 2020-10-31 MED ORDER — DILTIAZEM HCL 30 MG PO TABS
30.0000 mg | ORAL_TABLET | Freq: Three times a day (TID) | ORAL | Status: DC
  Administered 2020-10-31 – 2020-11-01 (×3): 30 mg via ORAL
  Filled 2020-10-31: qty 0.5
  Filled 2020-10-31 (×2): qty 1

## 2020-10-31 MED ORDER — POTASSIUM CHLORIDE CRYS ER 20 MEQ PO TBCR
40.0000 meq | EXTENDED_RELEASE_TABLET | Freq: Once | ORAL | Status: AC
  Administered 2020-10-31: 40 meq via ORAL
  Filled 2020-10-31: qty 2

## 2020-10-31 MED ORDER — POTASSIUM CHLORIDE CRYS ER 20 MEQ PO TBCR: 40.0000 meq | EXTENDED_RELEASE_TABLET | ORAL | Status: DC

## 2020-10-31 NOTE — ED Notes (Signed)
Patient transported to X-ray 

## 2020-10-31 NOTE — Progress Notes (Signed)
PROGRESS NOTE  Frances Kent [id number] DOB: [birth year]   PCP: [name], MD  Patient is from: Home.  Lives with her great granddaughter.  Independent for ADL's and IADL's.  DOA: 10/30/2020 LOS: 1  Chief complaints:  Chief Complaint  Patient presents with   Weakness   Shortness of Breath     Brief Narrative / Interim history: 87 year old F with PMH of A. fib/SVT on Xarelto, PE, HTN, hypothyroidism, anxiety and GERD presenting with worsening shortness of breath, palpitation, fatigue and edema for 1 day, and admitted for A. fib with RVR, acute on chronic systolic CHF and possible LLL pneumonia.  BNP elevated to 1000 (baseline of 900).  CXR raises concern for LLL pneumonia or CHF.  Started on Cardizem drip, IV Lasix, CTX and azithromycin.  Cardiology consulted.  Patient improved quickly with diuretics and control of a heart rate.  Transition to p.o. Cardizem by cardiology.  Subjective: Seen and examined earlier this morning.  [name] at bedside.  Reports significant improvement in her breathing and fatigue.  Denies palpitation or chest pain.  Reports some dry cough.  Denies fever or chills.  Denies GI or UTI symptoms.  Objective: Vitals:   10/31/20 0815 10/31/20 0945 10/31/20 1030 10/31/20 1100  BP: (!) 112/56 137/83 (!) 99/58 97/61  Pulse: 88 (!) 56 (!) 59 95  Resp: (!) 26 (!) 32 (!) 29 (!) 24  Temp:      TempSrc:      SpO2: 96% 98% 97% 97%  Weight:      Height:        Intake/Output Summary (Last 24 hours) at 10/31/2020 1228 Last data filed at 10/31/2020 1137 Gross per 24 hour  Intake 358.42 ml  Output 1550 ml  Net -1191.58 ml   Filed Weights   10/30/20 0924  Weight: 44.9 kg    Examination:  GENERAL: No apparent distress.  Nontoxic. HEENT: MMM.  Vision and hearing grossly intact.  NECK: Supple.  No apparent JVD.  RESP: 97% on RA.  No IWOB.  Crackles over right lung base. CVS: Irregular rhythm.  HR ranges from 70s to 80s. Heart sounds normal.   ABD/GI/GU: BS+. Abd soft, NTND.  MSK/EXT:  Moves extremities. No apparent deformity.  Trace BLE edema. SKIN: no apparent skin lesion or wound NEURO: Awake, alert and oriented appropriately.  No apparent focal neuro deficit. PSYCH: Calm. Normal affect.   Procedures:  None  Microbiology summarized: [id number] and influenza PCR nonreactive. Blood cultures NGTD.  Assessment & Plan: Acute on chronic systolic CHF: TTE in [id number] with LVEF of 40 to [id number], normal diastolic function, moderate MV stenosis.  Takes Lasix as needed.  Reports compliance with fluid and salt.  Denies NSAID use.  Unclear if CHF provoked A. fib or vice versa.  BNP elevated to 1100.  CXR with cardiomegaly and mild vascular congestion.  Breathing and fatigue has improved.  About 900 cc UOP from yesterday.  None charted from overnight.  Creatinine slightly up but at baseline. Received IV Lasix 40 mg this morning. -Reassess fluid status in the morning -Follow echocardiogram -May need GDMT if EF remains low but defer this to cardiology -Sodium and fluid restriction -Monitor fluid status, renal functions and electrolytes.  Permanent A. fib with RVR: RVR resolved.  CHA2DS2-VASc score 6.  On amiodarone, Cardizem and Xarelto.  Since she was prescribed Cardizem 30 mg 3 times daily but has been taking twice daily.  She is on Xarelto for anticoagulation.  TSH within normal. -Transition  to p.o. Cardizem 30 mg 3 times daily by cardiology-but not a great term option if EF remains reduced. -Continue home amiodarone. -Consider consolidating to Cardizem CD in the morning -Continue Xarelto for anticoagulation  Severe sepsis due to community-acquired pneumonia?  POA with leukocytosis, tachycardia and lactic acidosis.  However, the diagnosis of her pneumonia is not quite clear in the setting of CHF.  Pro-Cal 0.11 suggesting against bacterial infection but no significant improvement on chest x-ray.  She has some shortness of breath, cough or  leukocytosis on presentation.  -Will complete short course of CAP coverage -Trend lactic acid  CKD-3A: Relatively stable. Recent Labs    03/28/20 1056 04/02/20 1021 06/08/20 1445 07/12/20 1126 09/07/20 2134 09/08/20 0650 09/09/20 0849 10/15/20 1437 10/30/20 0939 10/31/20 0658  BUN '21 19 11 23 13 14 13 17 18 18  '$ CREATININE 1.27* 1.19* 1.04 1.15* 1.32* 1.16* 1.02* 1.03* 0.91 1.03*  -Continue monitoring   Lactic acidosis: Likely due to the above. -Continue trending   Hypothyroidism: TSH within normal. -Continue home Euthyrox   GERD: Continue home Nexium   Chronic hypokalemia: K3.6.  Mg 2.1. -Received K-Dur 20 mEq earlier.  We will give additional 40 mill equivalent   Anxiety: Stable.   -Continue home Xanax   History of PE:  -Continue home Xarelto    Body mass index is 17.54 kg/m.         DVT prophylaxis:   Rivaroxaban (XARELTO) tablet 15 mg  Code Status: Full code Family Communication: Updated patient's daughter at bedside. Level of care: Progressive Cardiac Status is: Inpatient  Remains inpatient appropriate because:Ongoing diagnostic testing needed not appropriate for outpatient work up, IV treatments appropriate due to intensity of illness or inability to take PO, and Inpatient level of care appropriate due to severity of illness  Dispo: The patient is from: Home              Anticipated d/c is to: Home              Patient currently is not medically stable to d/c.   Difficult to place patient No       Consultants:  Cardiology   Sch Meds:  Scheduled Meds:  amiodarone  200 mg Oral Daily   diltiazem  30 mg Oral Q8H   levothyroxine  50 mcg Oral Q0600   pantoprazole  40 mg Oral Daily   Rivaroxaban  15 mg Oral Daily   Continuous Infusions:  azithromycin Stopped (10/31/20 1148)   cefTRIAXone (ROCEPHIN)  IV Stopped (10/31/20 1036)   PRN Meds:.acetaminophen **OR** acetaminophen, ALPRAZolam, benzonatate, chlorpheniramine-HYDROcodone,  HYDROcodone-acetaminophen, ipratropium, morphine injection, ondansetron **OR** ondansetron (ZOFRAN) IV, polyethylene glycol, traZODone  Antimicrobials: Anti-infectives (From admission, onward)    Start     Dose/Rate Route Frequency Ordered Stop   10/30/20 1030  cefTRIAXone (ROCEPHIN) 2 g in sodium chloride 0.9 % 100 mL IVPB        2 g 200 mL/hr over 30 Minutes Intravenous Every 24 hours 10/30/20 1021 11/04/20 1029   10/30/20 1030  azithromycin (ZITHROMAX) 500 mg in sodium chloride 0.9 % 250 mL IVPB        500 mg 250 mL/hr over 60 Minutes Intravenous Every 24 hours 10/30/20 1021 11/04/20 1029        I have personally reviewed the following labs and images: CBC: Recent Labs  Lab 10/30/20 0926 10/31/20 0658  WBC 10.6* 8.3  HGB 11.4* 10.8*  HCT 36.0 33.4*  MCV 87.6 87.7  PLT 507* 434*  BMP &GFR Recent Labs  Lab 10/30/20 0939 10/31/20 0658  NA 138 137  K 4.2 3.6  CL 106 102  CO2 20* 28  GLUCOSE 120* 99  BUN 18 18  CREATININE 0.91 1.03*  CALCIUM 8.4* 8.3*  MG  --  2.1   Estimated Creatinine Clearance: 26.8 mL/min (A) (by C-G formula based on SCr of 1.03 mg/dL (H)). Liver & Pancreas: Recent Labs  Lab 10/30/20 0939  AST 24  ALT 15  ALKPHOS 89  BILITOT 1.8*  PROT 6.8  ALBUMIN 3.3*   No results for input(s): LIPASE, AMYLASE in the last 168 hours. No results for input(s): AMMONIA in the last 168 hours. Diabetic: No results for input(s): HGBA1C in the last 72 hours. No results for input(s): GLUCAP in the last 168 hours. Cardiac Enzymes: No results for input(s): CKTOTAL, CKMB, CKMBINDEX, TROPONINI in the last 168 hours. No results for input(s): PROBNP in the last 8760 hours. Coagulation Profile: No results for input(s): INR, PROTIME in the last 168 hours. Thyroid Function Tests: Recent Labs    10/30/20 0939  TSH 2.300   Lipid Profile: No results for input(s): CHOL, HDL, LDLCALC, TRIG, CHOLHDL, LDLDIRECT in the last 72 hours. Anemia Panel: No results for  input(s): VITAMINB12, FOLATE, FERRITIN, TIBC, IRON, RETICCTPCT in the last 72 hours. Urine analysis:    Component Value Date/Time   COLORURINE YELLOW (A) 09/08/2020 0105   APPEARANCEUR HAZY (A) 09/08/2020 0105   APPEARANCEUR Clear 02/05/2013 1630   LABSPEC 1.024 09/08/2020 0105   LABSPEC 1.009 02/05/2013 1630   PHURINE 6.0 09/08/2020 0105   GLUCOSEU NEGATIVE 09/08/2020 0105   GLUCOSEU NEGATIVE 04/04/2018 1149   HGBUR NEGATIVE 09/08/2020 0105   BILIRUBINUR NEGATIVE 09/08/2020 0105   BILIRUBINUR 1 08/31/2017 1157   BILIRUBINUR Negative 02/05/2013 1630   KETONESUR 5 (A) 09/08/2020 0105   PROTEINUR 100 (A) 09/08/2020 0105   UROBILINOGEN 1.0 04/04/2018 1149   NITRITE NEGATIVE 09/08/2020 0105   LEUKOCYTESUR NEGATIVE 09/08/2020 0105   LEUKOCYTESUR 1+ 02/05/2013 1630   Sepsis Labs: Invalid input(s): PROCALCITONIN, Altha  Microbiology: Recent Results (from the past 240 hour(s))  SARS CORONAVIRUS 2 (TAT 6-24 HRS) Nasopharyngeal Nasopharyngeal Swab     Status: None   Collection Time: 10/30/20  9:29 AM   Specimen: Nasopharyngeal Swab  Result Value Ref Range Status   SARS Coronavirus 2 NEGATIVE NEGATIVE Final    Comment: (NOTE) SARS-CoV-2 target nucleic acids are NOT DETECTED.  The SARS-CoV-2 RNA is generally detectable in upper and lower respiratory specimens during the acute phase of infection. Negative results do not preclude SARS-CoV-2 infection, do not rule out co-infections with other pathogens, and should not be used as the sole basis for treatment or other patient management decisions. Negative results must be combined with clinical observations, patient history, and epidemiological information. The expected result is Negative.  Fact Sheet for Patients: SugarRoll.be  Fact Sheet for Healthcare Providers: https://www.woods-mathews.com/  This test is not yet approved or cleared by the [place] FDA and  has been authorized  for detection and/or diagnosis of SARS-CoV-2 by FDA under an Emergency Use Authorization (EUA). This EUA will remain  in effect (meaning this test can be used) for the duration of the COVID-19 declaration under Se ction 564(b)(1) of the Act, 21 U.S.C. section 360bbb-3(b)(1), unless the authorization is terminated or revoked sooner.  Performed at [facility], [place] [address], [place],  [address]   Blood culture (routine x 2)     Status: None (Preliminary result)  Collection Time: 10/30/20 10:35 AM   Specimen: BLOOD  Result Value Ref Range Status   Specimen Description BLOOD BLOOD LEFT ARM  Final   Special Requests   Final    BOTTLES DRAWN AEROBIC AND ANAEROBIC Blood Culture adequate volume   Culture   Final    NO GROWTH < 24 HOURS Performed at [facility], [address] [place], [place] [address]    Report Status PENDING  Incomplete  Blood culture (routine x 2)     Status: None (Preliminary result)   Collection Time: 10/30/20 10:35 AM   Specimen: BLOOD  Result Value Ref Range Status   Specimen Description BLOOD BLOOD LEFT HAND  Final   Special Requests   Final    BOTTLES DRAWN AEROBIC ONLY Blood Culture adequate volume   Culture   Final    NO GROWTH < 24 HOURS Performed at [facility], [address] [place], [place] [address]    Report Status PENDING  Incomplete    Radiology Studies: DG Chest 2 View  Result Date: 10/31/2020 CLINICAL DATA:  Shortness of breath, weakness EXAM: CHEST - 2 VIEW COMPARISON:  10/30/2020 FINDINGS: No significant change in chest radiographs with gross cardiomegaly and a small left pleural effusion with associated atelectasis or consolidation. No new airspace opacity. Disc degenerative disease of the thoracic spine. IMPRESSION: No significant change in chest radiographs with gross cardiomegaly and a small left pleural effusion with associated atelectasis or consolidation. No new airspace opacity. Electronically  Signed   By: Eddie Candle M.D.   On: 10/31/2020 11:30      Maurisa Tesmer T. Garner  If 7PM-7AM, please contact night-coverage [email or web address] 10/31/2020, 12:28 PM

## 2020-10-31 NOTE — Evaluation (Signed)
Occupational Therapy Evaluation Patient Details Name: Frances Kent MRN: [id number] DOB: 1930-11-08 Today's Date: 10/31/2020   History of Present Illness Pt is an 87 y/o F with PMH: HTN, Afib, PE, and GERD. Pt presented via [facility] from home with SOB, weakness and heart palpitations. Pt adm for Afib with RVR and mild CHF exacerbation.   Clinical Impression   Pt seen for OT evaluation this date in setting of acute hospitalization d/t Afib with RVR. Pt reports being INDEP for BADLs and mobility at baseline. She presents with some decreased activity tolerance this date, but is primarily able to perform all ADLs/ADL mobility on OT Assessment at her self reported functional baseline. Initial STS performed with CGA, but pt progresses to MOD I for increased time with no physical assist or AD required. Demos G balance. Pt with good functional strength for ADL performance. No further OT needs detected wither acutely nor do I anticipate pt will require f/u services. Will complete order.      Recommendations for follow up therapy are one component of a multi-disciplinary discharge planning process, led by the attending physician.  Recommendations may be updated based on patient status, additional functional criteria and insurance authorization.   Follow Up Recommendations  No OT follow up    Equipment Recommendations  Tub/shower seat    Recommendations for Other Services       Precautions / Restrictions Precautions Precautions: Fall Restrictions Weight Bearing Restrictions: No Other Position/Activity Restrictions: low BPs      Mobility Bed Mobility Overal bed mobility: Modified Independent                  Transfers Overall transfer level: Modified independent                    Balance Overall balance assessment: Modified Independent;Mild deficits observed, not formally tested                                         ADL either performed or assessed  with clinical judgement   ADL Overall ADL's : Modified independent;At baseline                                             Vision Patient Visual Report: No change from baseline       Perception     Praxis      Pertinent Vitals/Pain Pain Assessment: No/denies pain     Hand Dominance     Extremity/Trunk Assessment Upper Extremity Assessment Upper Extremity Assessment: Overall WFL for tasks assessed (pt is slight, but her strength is functional and no ROM issues)   Lower Extremity Assessment Lower Extremity Assessment: Overall WFL for tasks assessed       Communication Communication Communication: No difficulties   Cognition Arousal/Alertness: Awake/alert Behavior During Therapy: WFL for tasks assessed/performed Overall Cognitive Status: Within Functional Limits for tasks assessed                                     General Comments  primarily limited with balance this date 2/2 low BPs, but no LOB or sway on OT assessment    Exercises Other Exercises Other Exercises: ed re role of  OT   Shoulder Instructions      Home Living Family/patient expects to be discharged to:: Private residence Living Arrangements: Other relatives Available Help at Discharge: Family Type of Home: [place]: Laundry or work area in [place]: Environmental consultant - 4 wheels   Additional Comments: doesnt use      Prior Functioning/Environment Level of Independence: Independent  Gait / Transfers Assistance Needed: no use of AD for fxl mobility at baseline ADL's / Homemaking Assistance Needed: Able to perform all I/ADLs. Has rarely driven lately in past 1-2 weeks d/t fatigue, but is able to drive at baseline. Family assists with laundry as it is in the basement            OT Problem List: Decreased activity tolerance      OT Treatment/Interventions: Self-care/ADL training    OT Goals(Current goals can be  found in the care plan section) Acute Rehab OT Goals Patient Stated Goal: to get my heart better and go home OT Goal Formulation: All assessment and education complete, DC therapy  OT Frequency:     Barriers to D/C:            Co-evaluation              AM-PAC OT "6 Clicks" Daily Activity     Outcome Measure Help from another person eating meals?: None Help from another person taking care of personal grooming?: None Help from another person toileting, which includes using toliet, bedpan, or urinal?: None Help from another person bathing (including washing, rinsing, drying)?: None Help from another person to put on and taking off regular upper body clothing?: None Help from another person to put on and taking off regular lower body clothing?: None 6 Click Score: 24   End of Session Equipment Utilized During Treatment: Gait belt Nurse Communication: Mobility status;Other (comment) (Map 55 and RN okays some light mobility)  Activity Tolerance: Patient tolerated treatment well Patient left: in bed;Other (comment) (in hospital bed from ER stretcher to transport to floor with transport staff)  OT Visit Diagnosis: Muscle weakness (generalized) (M62.81)                Time: [id number] OT Time Calculation (min): 21 min Charges:  OT General Charges $OT Visit: 1 Visit OT Evaluation $OT Eval Moderate Complexity: [place]ain, Barn[place], OTR/L ascom [phone] 10/31/20, 5:21 PM

## 2020-10-31 NOTE — ED Notes (Signed)
Messaged RN bed assigned 

## 2020-10-31 NOTE — ED Notes (Signed)
Pt ambulated to hospital bed in hallway, denies any weakness or dizziness at this time.

## 2020-10-31 NOTE — Progress Notes (Signed)
[facility] Cardiology  SUBJECTIVE: Patient laying in bed, reports feeling much better, less shortness of breath   Vitals:   10/31/20 0545 10/31/20 0630 10/31/20 0721 10/31/20 0815  BP: 115/72 125/87 122/77 (!) 112/56  Pulse: 99 (!) 105 (!) 117 88  Resp: (!) 22 (!) 28  (!) 26  Temp:      TempSrc:      SpO2: 96% 97% 93% 96%  Weight:      Height:         Intake/Output Summary (Last 24 hours) at 10/31/2020 0951 Last data filed at 10/31/2020 [id number] Gross per 24 hour  Intake 320.46 ml  Output 1550 ml  Net -1229.54 ml      PHYSICAL EXAM  General: Well developed, well nourished, in no acute distress HEENT:  Normocephalic and atramatic Neck:  No JVD.  Lungs: Clear bilaterally to auscultation and percussion. Heart: HRRR . Normal S1 and S2 without gallops or murmurs.  Abdomen: Bowel sounds are positive, abdomen soft and non-tender  Msk:  Back normal, normal gait. Normal strength and tone for age. Extremities: No clubbing, cyanosis or edema.   Neuro: Alert and oriented X 3. Psych:  Good affect, responds appropriately   LABS: Basic Metabolic Panel: Recent Labs    10/30/20 0939 10/31/20 0658  NA 138 137  K 4.2 3.6  CL 106 102  CO2 20* 28  GLUCOSE 120* 99  BUN 18 18  CREATININE 0.91 1.03*  CALCIUM 8.4* 8.3*  MG  --  2.1   Liver Function Tests: Recent Labs    10/30/20 0939  AST 24  ALT 15  ALKPHOS 89  BILITOT 1.8*  PROT 6.8  ALBUMIN 3.3*   No results for input(s): LIPASE, AMYLASE in the last 72 hours. CBC: Recent Labs    10/30/20 0926 10/31/20 0658  WBC 10.6* 8.3  HGB 11.4* 10.8*  HCT 36.0 33.4*  MCV 87.6 87.7  PLT 507* 434*   Cardiac Enzymes: No results for input(s): CKTOTAL, CKMB, CKMBINDEX, TROPONINI in the last 72 hours. BNP: Invalid input(s): POCBNP D-Dimer: No results for input(s): DDIMER in the last 72 hours. Hemoglobin A1C: No results for input(s): HGBA1C in the last 72 hours. Fasting Lipid Panel: No results for input(s): CHOL, HDL, LDLCALC, TRIG,  CHOLHDL, LDLDIRECT in the last 72 hours. Thyroid Function Tests: Recent Labs    10/30/20 0939  TSH 2.300   Anemia Panel: No results for input(s): VITAMINB12, FOLATE, FERRITIN, TIBC, IRON, RETICCTPCT in the last 72 hours.  DG Chest Port 1 View  Result Date: 10/30/2020 CLINICAL DATA:  Shortness of breath and weakness. EXAM: PORTABLE CHEST 1 VIEW COMPARISON:  10/15/2020 FINDINGS: Numerous leads and wires project over the chest. Patient rotated minimally left. Midline trachea. Mild cardiomegaly. Small left pleural effusion. No pneumothorax. Interstitial prominence and indistinctness is increased. Left greater than right base airspace disease. IMPRESSION: Cardiomegaly with mild congestive heart failure. Small left pleural effusion with bibasilar airspace disease. This could represent atelectasis or, especially at the left lung base, infection. Electronically Signed   By: Abigail Miyamoto M.D.   On: 10/30/2020 10:12     Echo pending  TELEMETRY: Atrial fibrillation at 85 bpm:  ASSESSMENT AND PLAN:  Active Problems:   Atrial fibrillation with RVR (HCC)    1.  Acute on chronic systolic congestive heart failure, BNP 1073.7, clinical improvement noted after diuresis with furosemide 40 mg IV 2.  Atrial fibrillation with rapid ventricular rate, in the setting of acute on chronic systolic congestive heart failure, on  diltiazem drip, heart rate improved to 85 bpm, on Xarelto for stroke prevention, and amiodarone for rate and rhythm control   Recommendations   1.  Agree with overall current therapy 2.  Continue diuresis as needed 3.  Carefully monitor renal status 4.  Continue Xarelto for stroke prevention 5.  Transition diltiazem drip to diltiazem 30 mg p.o. every 8 hours 6.  Continue amiodarone 200 mg daily for rate and rhythm control      Isaias Cowman, MD, PhD, [facility] 10/31/2020 9:51 AM

## 2020-11-01 ENCOUNTER — Inpatient Hospital Stay (HOSPITAL_COMMUNITY)
Admission: AD | Admit: 2020-11-01 | Discharge: 2020-11-04 | DRG: 023 | Disposition: A | Discharge status: Rehabilitation Facility | Payer: Medicare HMO | Attending: Neurology | Admitting: Neurology

## 2020-11-01 ENCOUNTER — Inpatient Hospital Stay (HOSPITAL_COMMUNITY): Payer: Medicare HMO

## 2020-11-01 ENCOUNTER — Inpatient Hospital Stay (HOSPITAL_COMMUNITY): Payer: Medicare HMO | Admitting: Anesthesiology

## 2020-11-01 ENCOUNTER — Encounter (HOSPITAL_COMMUNITY)
Admission: AD | Disposition: A | Discharge status: Rehabilitation Facility | Payer: Self-pay | Source: Home / Self Care | Attending: Neurology

## 2020-11-01 ENCOUNTER — Inpatient Hospital Stay: Payer: Medicare HMO

## 2020-11-01 ENCOUNTER — Encounter (HOSPITAL_COMMUNITY): Payer: Self-pay | Admitting: Interventional Radiology

## 2020-11-01 ENCOUNTER — Inpatient Hospital Stay
Admit: 2020-11-01 | Discharge: 2020-11-01 | Disposition: A | Discharge status: Home / Self Care | Payer: Medicare HMO | Attending: Family Medicine | Admitting: Family Medicine

## 2020-11-01 ENCOUNTER — Encounter: Payer: Self-pay | Admitting: Family Medicine

## 2020-11-01 DIAGNOSIS — J9690 Respiratory failure, unspecified, unspecified whether with hypoxia or hypercapnia: Secondary | ICD-10-CM | POA: Diagnosis not present

## 2020-11-01 DIAGNOSIS — I4821 Permanent atrial fibrillation: Secondary | ICD-10-CM | POA: Diagnosis present

## 2020-11-01 DIAGNOSIS — J189 Pneumonia, unspecified organism: Secondary | ICD-10-CM | POA: Diagnosis present

## 2020-11-01 DIAGNOSIS — I69319 Unspecified symptoms and signs involving cognitive functions following cerebral infarction: Secondary | ICD-10-CM | POA: Diagnosis not present

## 2020-11-01 DIAGNOSIS — I6602 Occlusion and stenosis of left middle cerebral artery: Secondary | ICD-10-CM | POA: Diagnosis not present

## 2020-11-01 DIAGNOSIS — I69320 Aphasia following cerebral infarction: Secondary | ICD-10-CM | POA: Diagnosis not present

## 2020-11-01 DIAGNOSIS — R55 Syncope and collapse: Secondary | ICD-10-CM

## 2020-11-01 DIAGNOSIS — I639 Cerebral infarction, unspecified: Secondary | ICD-10-CM | POA: Diagnosis present

## 2020-11-01 DIAGNOSIS — I63232 Cerebral infarction due to unspecified occlusion or stenosis of left carotid arteries: Secondary | ICD-10-CM | POA: Diagnosis present

## 2020-11-01 DIAGNOSIS — Z978 Presence of other specified devices: Secondary | ICD-10-CM

## 2020-11-01 DIAGNOSIS — N1831 Chronic kidney disease, stage 3a: Secondary | ICD-10-CM | POA: Diagnosis present

## 2020-11-01 DIAGNOSIS — R29730 NIHSS score 30: Secondary | ICD-10-CM | POA: Diagnosis present

## 2020-11-01 DIAGNOSIS — R29818 Other symptoms and signs involving the nervous system: Secondary | ICD-10-CM | POA: Diagnosis not present

## 2020-11-01 DIAGNOSIS — Z8616 Personal history of COVID-19: Secondary | ICD-10-CM | POA: Diagnosis not present

## 2020-11-01 DIAGNOSIS — R4701 Aphasia: Secondary | ICD-10-CM | POA: Diagnosis present

## 2020-11-01 DIAGNOSIS — G8191 Hemiplegia, unspecified affecting right dominant side: Secondary | ICD-10-CM | POA: Diagnosis present

## 2020-11-01 DIAGNOSIS — G9349 Other encephalopathy: Secondary | ICD-10-CM | POA: Diagnosis not present

## 2020-11-01 DIAGNOSIS — Z9889 Other specified postprocedural states: Secondary | ICD-10-CM | POA: Diagnosis not present

## 2020-11-01 DIAGNOSIS — E43 Unspecified severe protein-calorie malnutrition: Secondary | ICD-10-CM | POA: Diagnosis not present

## 2020-11-01 DIAGNOSIS — I63512 Cerebral infarction due to unspecified occlusion or stenosis of left middle cerebral artery: Secondary | ICD-10-CM | POA: Diagnosis present

## 2020-11-01 DIAGNOSIS — Z888 Allergy status to other drugs, medicaments and biological substances status: Secondary | ICD-10-CM | POA: Diagnosis not present

## 2020-11-01 DIAGNOSIS — I4891 Unspecified atrial fibrillation: Secondary | ICD-10-CM | POA: Diagnosis not present

## 2020-11-01 DIAGNOSIS — R471 Dysarthria and anarthria: Secondary | ICD-10-CM | POA: Diagnosis present

## 2020-11-01 DIAGNOSIS — Z7189 Other specified counseling: Secondary | ICD-10-CM | POA: Diagnosis not present

## 2020-11-01 DIAGNOSIS — I4819 Other persistent atrial fibrillation: Secondary | ICD-10-CM | POA: Diagnosis not present

## 2020-11-01 DIAGNOSIS — Z823 Family history of stroke: Secondary | ICD-10-CM

## 2020-11-01 DIAGNOSIS — I959 Hypotension, unspecified: Secondary | ICD-10-CM

## 2020-11-01 DIAGNOSIS — I10 Essential (primary) hypertension: Secondary | ICD-10-CM | POA: Diagnosis not present

## 2020-11-01 DIAGNOSIS — T45515A Adverse effect of anticoagulants, initial encounter: Secondary | ICD-10-CM | POA: Diagnosis present

## 2020-11-01 DIAGNOSIS — I6612 Occlusion and stenosis of left anterior cerebral artery: Secondary | ICD-10-CM | POA: Diagnosis not present

## 2020-11-01 DIAGNOSIS — I130 Hypertensive heart and chronic kidney disease with heart failure and stage 1 through stage 4 chronic kidney disease, or unspecified chronic kidney disease: Secondary | ICD-10-CM | POA: Diagnosis not present

## 2020-11-01 DIAGNOSIS — E876 Hypokalemia: Secondary | ICD-10-CM | POA: Diagnosis not present

## 2020-11-01 DIAGNOSIS — Z86711 Personal history of pulmonary embolism: Secondary | ICD-10-CM | POA: Diagnosis not present

## 2020-11-01 DIAGNOSIS — E440 Moderate protein-calorie malnutrition: Secondary | ICD-10-CM | POA: Insufficient documentation

## 2020-11-01 DIAGNOSIS — H3530 Unspecified macular degeneration: Secondary | ICD-10-CM | POA: Diagnosis present

## 2020-11-01 DIAGNOSIS — R0600 Dyspnea, unspecified: Secondary | ICD-10-CM | POA: Diagnosis not present

## 2020-11-01 DIAGNOSIS — I5022 Chronic systolic (congestive) heart failure: Secondary | ICD-10-CM | POA: Diagnosis not present

## 2020-11-01 DIAGNOSIS — E785 Hyperlipidemia, unspecified: Secondary | ICD-10-CM | POA: Diagnosis not present

## 2020-11-01 DIAGNOSIS — I340 Nonrheumatic mitral (valve) insufficiency: Secondary | ICD-10-CM | POA: Diagnosis not present

## 2020-11-01 DIAGNOSIS — D631 Anemia in chronic kidney disease: Secondary | ICD-10-CM | POA: Diagnosis present

## 2020-11-01 DIAGNOSIS — E039 Hypothyroidism, unspecified: Secondary | ICD-10-CM | POA: Diagnosis present

## 2020-11-01 DIAGNOSIS — I63411 Cerebral infarction due to embolism of right middle cerebral artery: Secondary | ICD-10-CM | POA: Diagnosis not present

## 2020-11-01 DIAGNOSIS — Z7901 Long term (current) use of anticoagulants: Secondary | ICD-10-CM

## 2020-11-01 DIAGNOSIS — R5381 Other malaise: Secondary | ICD-10-CM | POA: Diagnosis present

## 2020-11-01 DIAGNOSIS — I5021 Acute systolic (congestive) heart failure: Secondary | ICD-10-CM | POA: Diagnosis not present

## 2020-11-01 DIAGNOSIS — I083 Combined rheumatic disorders of mitral, aortic and tricuspid valves: Secondary | ICD-10-CM | POA: Diagnosis present

## 2020-11-01 DIAGNOSIS — R9431 Abnormal electrocardiogram [ECG] [EKG]: Secondary | ICD-10-CM

## 2020-11-01 DIAGNOSIS — H53461 Homonymous bilateral field defects, right side: Secondary | ICD-10-CM | POA: Diagnosis present

## 2020-11-01 DIAGNOSIS — I6623 Occlusion and stenosis of bilateral posterior cerebral arteries: Secondary | ICD-10-CM | POA: Diagnosis not present

## 2020-11-01 DIAGNOSIS — I69322 Dysarthria following cerebral infarction: Secondary | ICD-10-CM | POA: Diagnosis not present

## 2020-11-01 DIAGNOSIS — K219 Gastro-esophageal reflux disease without esophagitis: Secondary | ICD-10-CM | POA: Diagnosis not present

## 2020-11-01 DIAGNOSIS — R7303 Prediabetes: Secondary | ICD-10-CM | POA: Diagnosis present

## 2020-11-01 DIAGNOSIS — I69391 Dysphagia following cerebral infarction: Secondary | ICD-10-CM | POA: Diagnosis not present

## 2020-11-01 DIAGNOSIS — J90 Pleural effusion, not elsewhere classified: Secondary | ICD-10-CM | POA: Diagnosis not present

## 2020-11-01 DIAGNOSIS — A419 Sepsis, unspecified organism: Secondary | ICD-10-CM | POA: Diagnosis not present

## 2020-11-01 DIAGNOSIS — R197 Diarrhea, unspecified: Secondary | ICD-10-CM | POA: Diagnosis present

## 2020-11-01 DIAGNOSIS — Z681 Body mass index (BMI) 19 or less, adult: Secondary | ICD-10-CM | POA: Diagnosis not present

## 2020-11-01 DIAGNOSIS — Z515 Encounter for palliative care: Secondary | ICD-10-CM | POA: Diagnosis not present

## 2020-11-01 DIAGNOSIS — R29810 Facial weakness: Secondary | ICD-10-CM | POA: Diagnosis present

## 2020-11-01 DIAGNOSIS — I5023 Acute on chronic systolic (congestive) heart failure: Secondary | ICD-10-CM | POA: Diagnosis not present

## 2020-11-01 DIAGNOSIS — I480 Paroxysmal atrial fibrillation: Secondary | ICD-10-CM | POA: Diagnosis not present

## 2020-11-01 DIAGNOSIS — I69351 Hemiplegia and hemiparesis following cerebral infarction affecting right dominant side: Secondary | ICD-10-CM | POA: Diagnosis not present

## 2020-11-01 DIAGNOSIS — J9811 Atelectasis: Secondary | ICD-10-CM | POA: Diagnosis not present

## 2020-11-01 DIAGNOSIS — I6522 Occlusion and stenosis of left carotid artery: Secondary | ICD-10-CM | POA: Diagnosis not present

## 2020-11-01 DIAGNOSIS — Z79890 Hormone replacement therapy (postmenopausal): Secondary | ICD-10-CM

## 2020-11-01 DIAGNOSIS — Z8249 Family history of ischemic heart disease and other diseases of the circulatory system: Secondary | ICD-10-CM

## 2020-11-01 DIAGNOSIS — F419 Anxiety disorder, unspecified: Secondary | ICD-10-CM | POA: Diagnosis present

## 2020-11-01 DIAGNOSIS — N179 Acute kidney failure, unspecified: Secondary | ICD-10-CM | POA: Diagnosis present

## 2020-11-01 DIAGNOSIS — R791 Abnormal coagulation profile: Secondary | ICD-10-CM | POA: Diagnosis present

## 2020-11-01 DIAGNOSIS — I69354 Hemiplegia and hemiparesis following cerebral infarction affecting left non-dominant side: Secondary | ICD-10-CM | POA: Diagnosis not present

## 2020-11-01 DIAGNOSIS — Z79899 Other long term (current) drug therapy: Secondary | ICD-10-CM

## 2020-11-01 DIAGNOSIS — Z86718 Personal history of other venous thrombosis and embolism: Secondary | ICD-10-CM

## 2020-11-01 HISTORY — PX: IR PERCUTANEOUS ART THROMBECTOMY/INFUSION INTRACRANIAL INC DIAG ANGIO: IMG6087

## 2020-11-01 HISTORY — PX: IR CT HEAD LTD: IMG2386

## 2020-11-01 HISTORY — PX: RADIOLOGY WITH ANESTHESIA: SHX6223

## 2020-11-01 LAB — POCT I-STAT 7, (LYTES, BLD GAS, ICA,H+H)
Acid-Base Excess: 1 mmol/L (ref 0.0–2.0)
Bicarbonate: 25.7 mmol/L (ref 20.0–28.0)
Calcium, Ion: 1.12 mmol/L — ABNORMAL LOW (ref 1.15–1.40)
HCT: 30 % — ABNORMAL LOW (ref 36.0–46.0)
Hemoglobin: 10.2 g/dL — ABNORMAL LOW (ref 12.0–15.0)
O2 Saturation: 100 %
Patient temperature: 97.7
Potassium: 3.5 mmol/L (ref 3.5–5.1)
Sodium: 138 mmol/L (ref 135–145)
TCO2: 27 mmol/L (ref 22–32)
pCO2 arterial: 37.6 mmHg (ref 32.0–48.0)
pH, Arterial: 7.441 (ref 7.350–7.450)
pO2, Arterial: 337 mmHg — ABNORMAL HIGH (ref 83.0–108.0)

## 2020-11-01 LAB — RENAL FUNCTION PANEL
Albumin: 2.6 g/dL — ABNORMAL LOW (ref 3.5–5.0)
Anion gap: 5 (ref 5–15)
BUN: 18 mg/dL (ref 8–23)
CO2: 28 mmol/L (ref 22–32)
Calcium: 8.4 mg/dL — ABNORMAL LOW (ref 8.9–10.3)
Chloride: 105 mmol/L (ref 98–111)
Creatinine, Ser: 1.01 mg/dL — ABNORMAL HIGH (ref 0.44–1.00)
GFR, Estimated: 54 mL/min — ABNORMAL LOW (ref >60)
Glucose, Bld: 103 mg/dL — ABNORMAL HIGH (ref 70–99)
Phosphorus: 2.6 mg/dL (ref 2.5–4.6)
Potassium: 4.7 mmol/L (ref 3.5–5.1)
Sodium: 138 mmol/L (ref 135–145)

## 2020-11-01 LAB — TROPONIN I (HIGH SENSITIVITY)
Troponin I (High Sensitivity): 10 ng/L (ref <18)
Troponin I (High Sensitivity): 9 ng/L (ref <18)

## 2020-11-01 LAB — CBC
HCT: 32.4 % — ABNORMAL LOW (ref 36.0–46.0)
Hemoglobin: 10.3 g/dL — ABNORMAL LOW (ref 12.0–15.0)
MCH: 28.2 pg (ref 26.0–34.0)
MCHC: 31.8 g/dL (ref 30.0–36.0)
MCV: 88.8 fL (ref 80.0–100.0)
Platelets: 396 10*3/uL (ref 150–400)
RBC: 3.65 MIL/uL — ABNORMAL LOW (ref 3.87–5.11)
RDW: 18.1 % — ABNORMAL HIGH (ref 11.5–15.5)
WBC: 7.8 10*3/uL (ref 4.0–10.5)
nRBC: 0 % (ref 0.0–0.2)

## 2020-11-01 LAB — PROCALCITONIN: Procalcitonin: <0.1 ng/mL

## 2020-11-01 LAB — LACTIC ACID, PLASMA
Lactic Acid, Venous: 1.1 mmol/L (ref 0.5–1.9)
Lactic Acid, Venous: 1.2 mmol/L (ref 0.5–1.9)

## 2020-11-01 LAB — GLUCOSE, CAPILLARY: Glucose-Capillary: 111 mg/dL — ABNORMAL HIGH (ref 70–99)

## 2020-11-01 LAB — MAGNESIUM: Magnesium: 2.1 mg/dL (ref 1.7–2.4)

## 2020-11-01 LAB — MRSA NEXT GEN BY PCR, NASAL: MRSA by PCR Next Gen: NOT DETECTED

## 2020-11-01 LAB — BRAIN NATRIURETIC PEPTIDE: B Natriuretic Peptide: 418.5 pg/mL — ABNORMAL HIGH (ref 0.0–100.0)

## 2020-11-01 SURGERY — RADIOLOGY WITH ANESTHESIA
Anesthesia: General

## 2020-11-01 MED ORDER — DOCUSATE SODIUM 50 MG/5ML PO LIQD: 100.0000 mg | Freq: Two times a day (BID) | ORAL | Status: DC

## 2020-11-01 MED ORDER — POLYETHYLENE GLYCOL 3350 17 G PO PACK: 17.0000 g | PACK | Freq: Every day | ORAL | Status: DC

## 2020-11-01 MED ORDER — PROPOFOL 1000 MG/100ML IV EMUL
0.0000 ug/kg/min | INTRAVENOUS | Status: DC
  Administered 2020-11-01: 15 ug/kg/min via INTRAVENOUS
  Filled 2020-11-01: qty 100

## 2020-11-01 MED ORDER — ORAL CARE MOUTH RINSE
15.0000 mL | OROMUCOSAL | Status: DC
  Administered 2020-11-01 – 2020-11-03 (×13): 15 mL via OROMUCOSAL

## 2020-11-01 MED ORDER — IOHEXOL 240 MG/ML SOLN
50.0000 mL | Freq: Once | INTRAMUSCULAR | Status: AC | PRN
  Administered 2020-11-01: 34 mL via INTRA_ARTERIAL

## 2020-11-01 MED ORDER — ORAL CARE MOUTH RINSE
15.0000 mL | OROMUCOSAL | Status: DC
  Administered 2020-11-01 – 2020-11-02 (×6): 15 mL via OROMUCOSAL

## 2020-11-01 MED ORDER — CHLORHEXIDINE GLUCONATE CLOTH 2 % EX PADS: 6.0000 | MEDICATED_PAD | Freq: Every day | CUTANEOUS | Status: DC

## 2020-11-01 MED ORDER — FENTANYL CITRATE PF 50 MCG/ML IJ SOSY
25.0000 ug | PREFILLED_SYRINGE | INTRAMUSCULAR | Status: DC | PRN
  Administered 2020-11-02: 50 ug via INTRAVENOUS
  Filled 2020-11-01: qty 1

## 2020-11-01 MED ORDER — CHLORHEXIDINE GLUCONATE 0.12% ORAL RINSE (MEDLINE KIT)
15.0000 mL | Freq: Two times a day (BID) | OROMUCOSAL | Status: DC
  Administered 2020-11-01 – 2020-11-02 (×2): 15 mL via OROMUCOSAL

## 2020-11-01 MED ORDER — ROCURONIUM BROMIDE 10 MG/ML (PF) SYRINGE
PREFILLED_SYRINGE | INTRAVENOUS | Status: DC | PRN
Start: 2020-11-01 — End: 2020-11-01
  Administered 2020-11-01: 40 mg via INTRAVENOUS

## 2020-11-01 MED ORDER — PANTOPRAZOLE SODIUM 40 MG IV SOLR
40.0000 mg | Freq: Every day | INTRAVENOUS | Status: DC
  Administered 2020-11-01: 40 mg via INTRAVENOUS
  Filled 2020-11-01: qty 40

## 2020-11-01 MED ORDER — DILTIAZEM HCL 30 MG PO TABS: 30.0000 mg | ORAL_TABLET | Freq: Four times a day (QID) | ORAL | Status: DC | PRN

## 2020-11-01 MED ORDER — PROPOFOL 500 MG/50ML IV EMUL
INTRAVENOUS | Status: DC | PRN
  Administered 2020-11-01: 50 ug/kg/min via INTRAVENOUS

## 2020-11-01 MED ORDER — LEVOTHYROXINE SODIUM 50 MCG PO TABS
50.0000 ug | ORAL_TABLET | Freq: Every day | ORAL | Status: DC
  Administered 2020-11-03 – 2020-11-04 (×2): 50 ug via ORAL
  Filled 2020-11-01 (×2): qty 1

## 2020-11-01 MED ORDER — INSULIN ASPART 100 UNIT/ML IJ SOLN
0.0000 [IU] | INTRAMUSCULAR | Status: DC
Start: 2020-11-01 — End: 2020-11-03

## 2020-11-01 MED ORDER — PROPOFOL 10 MG/ML IV BOLUS
INTRAVENOUS | Status: DC | PRN
  Administered 2020-11-01 (×2): 30 mg via INTRAVENOUS

## 2020-11-01 MED ORDER — SUCCINYLCHOLINE CHLORIDE 200 MG/10ML IV SOSY
PREFILLED_SYRINGE | INTRAVENOUS | Status: DC | PRN
  Administered 2020-11-01: 120 mg via INTRAVENOUS

## 2020-11-01 MED ORDER — IOHEXOL 350 MG/ML SOLN
100.0000 mL | Freq: Once | INTRAVENOUS | Status: AC | PRN
  Administered 2020-11-01: 30 mL via INTRA_ARTERIAL

## 2020-11-01 MED ORDER — CLEVIDIPINE BUTYRATE 0.5 MG/ML IV EMUL
0.0000 mg/h | INTRAVENOUS | Status: DC
  Administered 2020-11-01: 1 mg/h via INTRAVENOUS

## 2020-11-01 MED ORDER — POLYETHYLENE GLYCOL 3350 17 G PO PACK: 17.0000 g | PACK | Freq: Every day | ORAL | Status: DC | PRN

## 2020-11-01 MED ORDER — SODIUM CHLORIDE 0.9 % IV SOLN: 2.0000 g | INTRAVENOUS | Status: DC

## 2020-11-01 MED ORDER — METOPROLOL SUCCINATE ER 25 MG PO TB24: 25.0000 mg | ORAL_TABLET | Freq: Every day | ORAL | Status: DC

## 2020-11-01 MED ORDER — DOCUSATE SODIUM 100 MG PO CAPS: 100.0000 mg | ORAL_CAPSULE | Freq: Two times a day (BID) | ORAL | Status: DC | PRN

## 2020-11-01 MED ORDER — ACETAMINOPHEN 500 MG PO TABS: 500.0000 mg | ORAL_TABLET | Freq: Four times a day (QID) | ORAL | Status: DC | PRN

## 2020-11-01 MED ORDER — ETOMIDATE 2 MG/ML IV SOLN
INTRAVENOUS | Status: DC | PRN
  Administered 2020-11-01: 12 mg via INTRAVENOUS

## 2020-11-01 MED ORDER — CLEVIDIPINE BUTYRATE 0.5 MG/ML IV EMUL
INTRAVENOUS | Status: AC
  Filled 2020-11-01: qty 100

## 2020-11-01 MED ORDER — CEFTRIAXONE SODIUM 2 G IJ SOLR
2.0000 g | INTRAMUSCULAR | Status: AC
  Administered 2020-11-02 – 2020-11-03 (×2): 2 g via INTRAVENOUS
  Filled 2020-11-01 (×2): qty 20

## 2020-11-01 MED ORDER — SODIUM CHLORIDE 0.9 % IV SOLN: INTRAVENOUS | Status: DC | PRN

## 2020-11-01 MED ORDER — IOHEXOL 350 MG/ML SOLN
75.0000 mL | Freq: Once | INTRAVENOUS | Status: AC | PRN
  Administered 2020-11-01: 75 mL via INTRAVENOUS

## 2020-11-01 MED ORDER — FUROSEMIDE 10 MG/ML IJ SOLN
40.0000 mg | Freq: Once | INTRAMUSCULAR | Status: AC
  Administered 2020-11-01: 40 mg via INTRAVENOUS
  Filled 2020-11-01: qty 4

## 2020-11-01 MED ORDER — IPRATROPIUM BROMIDE 0.02 % IN SOLN: 0.5000 mg | Freq: Four times a day (QID) | RESPIRATORY_TRACT | Status: DC | PRN

## 2020-11-01 MED ORDER — STROKE: EARLY STAGES OF RECOVERY BOOK: Freq: Once | Status: AC

## 2020-11-01 MED ORDER — CHLORHEXIDINE GLUCONATE 0.12% ORAL RINSE (MEDLINE KIT)
15.0000 mL | Freq: Two times a day (BID) | OROMUCOSAL | Status: DC
  Administered 2020-11-01: 15 mL via OROMUCOSAL

## 2020-11-01 MED ORDER — MELATONIN 5 MG PO TABS: 5.0000 mg | ORAL_TABLET | Freq: Every day | ORAL | Status: DC

## 2020-11-01 MED ORDER — PHENYLEPHRINE HCL-NACL 20-0.9 MG/250ML-% IV SOLN
INTRAVENOUS | Status: DC | PRN
  Administered 2020-11-01: 50 ug/min via INTRAVENOUS

## 2020-11-01 MED ORDER — METOPROLOL SUCCINATE ER 25 MG PO TB24
25.0000 mg | ORAL_TABLET | Freq: Every day | ORAL | Status: DC
  Administered 2020-11-01: 25 mg via ORAL
  Filled 2020-11-01: qty 1

## 2020-11-01 MED ORDER — PHENYLEPHRINE HCL (PRESSORS) 10 MG/ML IV SOLN
INTRAVENOUS | Status: DC | PRN
  Administered 2020-11-01: 80 ug via INTRAVENOUS

## 2020-11-01 MED ORDER — LIDOCAINE 2% (20 MG/ML) 5 ML SYRINGE
INTRAMUSCULAR | Status: DC | PRN
  Administered 2020-11-01: 60 mg via INTRAVENOUS

## 2020-11-01 NOTE — Sedation Documentation (Signed)
Sheath pulled and groin site closed with an 8 fr Angioseal at 2045. Site remains level 0 and dressing clean, dry, intact. Pulses dopplerable. Handoff given to 4 N nursing staff.

## 2020-11-01 NOTE — Progress Notes (Addendum)
NeuroInterventional Radiology  Pre-Procedure Note  History (from record/discussed with Dr. Quinn Axe): Patient is 87 yo female admitted with hx HTN and a fib on xarelto, inpt for treatment of a-fib w/ RVR, acute on chronic CHF exacerbation and possible LLL PNA. Inpatient stroke code was called for acute onset global aphasia and R hemiplegia. LKW 1800, RN was in the room when she developed the new neurologic deficits. Patient is on xarelto last dose today therefore she was not a TNK candidate. Head CT wo showed NAICP, no ICH, ASPECTS 10. CTA H&N showed L ICA occlusion with reconstitution and a L M1 cutoff. mRS was given 1.   NIHSS:  20  CT ASPECTS:  10 CTA:   Left "t-occlusion" at the terminus CTP:   N/a   I have discussed the case with Dr. Quinn Axe of Stroke Neurology.  Given the patient's symptoms, imaging findings, baseline function, we agree they are an appropriate candidate for attempt for mechanical thrombectomy.    The risks and benefits of the procedure were discussed with the patient/patient's family, with specific risks including: bleeding, infection, arterial injury/dissection, contrast reaction, kidney injury, need for further procedure/surgery, neurologic deficit, 10-15% risk of intracranial hemorrhage, cardiopulmonary collapse, death. All questions were answered.  The patient/family would like to proceed with attempt at thrombectomy.   Dr. Quinn Axe had previously discussed the procedure with risk/benefit informed consent performed with the patient's daughter, Ms Gilberto Better. From the note:  "Consent for IR obtained from daughter [name] [id number]) over video conference. I explained that patient is having a large stroke from a blockage in an important artery going to her brain. Because she is on xarelto she is not a candidate for TNK. I explained the risks and benefits of IR thrombectomy for patient's LVO (L ICA and L M1) including a 10% risk of ICH. [name] gave informed  consent to proceed."   Agree with above.   I have spoken with Ms Gilberto Better [id number]), to make sure she had her questions answered regarding the plan for her mother.   Plan for cerebral angiogram and attempt at mechanical thrombectomy.   Signed,   Dulcy Fanny. Earleen Newport, DO

## 2020-11-01 NOTE — Procedures (Addendum)
Neuro-Interventional Radiology  Post Cerebral Angiogram Procedure Note  Operator:    Dr. Earleen Newport Assistant:   None  History:   History (from record/discussed with Dr. Quinn Axe): Patient is 87 yo female admitted with hx HTN and a fib on xarelto, inpt for treatment of a-fib w/ RVR, acute on chronic CHF exacerbation and possible LLL PNA. Inpatient stroke code was called for acute onset global aphasia and R hemiplegia. LKW 1800, RN was in the room when she developed the new neurologic deficits. Patient is on xarelto last dose today therefore she was not a TNK candidate. Head CT wo showed NAICP, no ICH, ASPECTS 10. CTA H&N showed L ICA occlusion with reconstitution and a L M1 cutoff. mRS was given 1.     Baseline mRS:  1 NIHSS:   20 Site of occlusion:  Left ICA terminus   Procedure: US guided right CFA access Cervical & Cerebral Angiogram Mechanical Thrombectomy x 2 Deployment of Angioseal Flat Panel CT in NIR  Left ICA terminus TICI 0 First Pass Device:  Zoom 71 aspiration  Result   TICI 3  Left MCA/M1 TICI 0 2nd Pass Device:  Zoom 71 + Solitaire 4x40  Result   TICI 3  Findings:   TICI 3 final     Flat panel CT: no hemorrhage     FMD left ICA  Anesthesia:   GETA  EBL:    [id number]     Complication:  None   Medication: IV tPA administered?: no IA Medication:  no  Recommendations: - right hip straight x 4 hrs - Goal SBP 120-140.   - Frequent NV checks - Repeat CT or MRI imaging recommended within 36 hours, discretion of Neurology - NIR to follow - stable to 4N 28 - to remain intubated judged by anesthesia team high risk   Signed,  Dulcy Fanny. Earleen Newport, DO

## 2020-11-01 NOTE — Anesthesia Preprocedure Evaluation (Addendum)
Anesthesia Evaluation  Patient identified by MRN, date of birth, ID band Patient unresponsive  General Assessment Comment:No paper consent reportedly to sign. Emergency case as per neuro team. Dr. Nyoka Cowden  Reviewed: Allergy & Precautions, NPO status , Patient's Chart, lab work & pertinent test resultsPreop documentation limited or incomplete due to emergent nature of procedure.  Airway Mallampati: II     Mouth opening: Limited Mouth Opening  Dental   Pulmonary    breath sounds clear to auscultation       Cardiovascular hypertension, + Peripheral Vascular Disease and +CHF   Rhythm:Regular Rate:Normal     Neuro/Psych Anxiety  Neuromuscular disease CVA    GI/Hepatic hiatal hernia, PUD, GERD  ,  Endo/Other  Hypothyroidism   Renal/GU Renal disease     Musculoskeletal  (+) Arthritis ,   Abdominal   Peds  Hematology   Anesthesia Other Findings   Reproductive/Obstetrics                           Anesthesia Physical Anesthesia Plan  ASA: 3  Anesthesia Plan: General   Post-op Pain Management:    Induction: Intravenous  PONV Risk Score and Plan: Treatment may vary due to age or medical condition, Ondansetron and Dexamethasone  Airway Management Planned: Oral ETT  Additional Equipment:   Intra-op Plan:   Post-operative Plan: Possible Post-op intubation/ventilation  Informed Consent: I have reviewed the patients History and Physical, chart, labs and discussed the procedure including the risks, benefits and alternatives for the proposed anesthesia with the patient or authorized representative who has indicated his/her understanding and acceptance.     Dental advisory given  Plan Discussed with: CRNA and Anesthesiologist  Anesthesia Plan Comments:         Anesthesia Quick Evaluation

## 2020-11-01 NOTE — Progress Notes (Signed)
Asked by Dr. Cyndia Skeeters to evaluate patient for code stroke  When I arrived to the medical unit, patient had already moved down to CT. I was accompanied by staff to CT to evaluate patient. Upon my arrival, patient was in bed, she was lethargic and was not following commands. She was able to grip with her left hand on command, but did not grip with right hand. She was posturing with right arm. Dr. Quinn Axe was evaluating patient via camera and helped direct examination. After discussing timeline of symptom onset and determining that patient was ambulatory and cognitively intact at baseline, Dr. Quinn Axe felt that patient's CT imaging indicated LVO with Left ICA occlusion and Left M1 cut off. Case was discussed by neuro with interventional radiology and decision was made to transfer the patient emergently to [facility] for neuro intervention.  I briefly discussed findings and plan with patient's daughter who was in her room. Further explanation was provided to daughter by Dr. Quinn Axe. [facility]  has been activated and patient will be transported to [facility] for neuro intervention. Post procedure, she will be admitted to [facility] neuro ICU under neurology service, Dr. Curly Shores, for closer monitoring.   [employer]

## 2020-11-01 NOTE — Progress Notes (Signed)
[facility] Cardiology  SUBJECTIVE: Patient laying in bed, reports doing well, less shortness of breath   Vitals:   10/31/20 2023 10/31/20 2356 11/01/20 0500 11/01/20 0737  BP: 100/67 101/75  100/70  Pulse: 83 85  92  Resp: '17 16  16  '$ Temp: 98.7 F (37.1 C) 97.8 F (36.6 C)  98.1 F (36.7 C)  TempSrc: Oral Oral    SpO2: 97% 97%  93%  Weight:   43.2 kg   Height:         Intake/Output Summary (Last 24 hours) at 11/01/2020 1021 Last data filed at 11/01/2020 1008 Gross per 24 hour  Intake 371.48 ml  Output 400 ml  Net -28.52 ml      PHYSICAL EXAM  General: Well developed, well nourished, in no acute distress HEENT:  Normocephalic and atramatic Neck:  No JVD.  Lungs: Clear bilaterally to auscultation and percussion. Heart: HRRR . Normal S1 and S2 without gallops or murmurs.  Abdomen: Bowel sounds are positive, abdomen soft and non-tender  Msk:  Back normal, normal gait. Normal strength and tone for age. Extremities: No clubbing, cyanosis or edema.   Neuro: Alert and oriented X 3. Psych:  Good affect, responds appropriately   LABS: Basic Metabolic Panel: Recent Labs    10/31/20 0658 11/01/20 0532  NA 137 138  K 3.6 4.7  CL 102 105  CO2 28 28  GLUCOSE 99 103*  BUN 18 18  CREATININE 1.03* 1.01*  CALCIUM 8.3* 8.4*  MG 2.1 2.1  PHOS  --  2.6   Liver Function Tests: Recent Labs    10/30/20 0939 11/01/20 0532  AST 24  --   ALT 15  --   ALKPHOS 89  --   BILITOT 1.8*  --   PROT 6.8  --   ALBUMIN 3.3* 2.6*   No results for input(s): LIPASE, AMYLASE in the last 72 hours. CBC: Recent Labs    10/31/20 0658 11/01/20 0532  WBC 8.3 7.8  HGB 10.8* 10.3*  HCT 33.4* 32.4*  MCV 87.7 88.8  PLT 434* 396   Cardiac Enzymes: No results for input(s): CKTOTAL, CKMB, CKMBINDEX, TROPONINI in the last 72 hours. BNP: Invalid input(s): POCBNP D-Dimer: No results for input(s): DDIMER in the last 72 hours. Hemoglobin A1C: No results for input(s): HGBA1C in the last 72  hours. Fasting Lipid Panel: No results for input(s): CHOL, HDL, LDLCALC, TRIG, CHOLHDL, LDLDIRECT in the last 72 hours. Thyroid Function Tests: Recent Labs    10/30/20 0939  TSH 2.300   Anemia Panel: No results for input(s): VITAMINB12, FOLATE, FERRITIN, TIBC, IRON, RETICCTPCT in the last 72 hours.  DG Chest 2 View  Result Date: 10/31/2020 CLINICAL DATA:  Shortness of breath, weakness EXAM: CHEST - 2 VIEW COMPARISON:  10/30/2020 FINDINGS: No significant change in chest radiographs with gross cardiomegaly and a small left pleural effusion with associated atelectasis or consolidation. No new airspace opacity. Disc degenerative disease of the thoracic spine. IMPRESSION: No significant change in chest radiographs with gross cardiomegaly and a small left pleural effusion with associated atelectasis or consolidation. No new airspace opacity. Electronically Signed   By: Eddie Candle M.D.   On: 10/31/2020 11:30     Echo pending  TELEMETRY: Atrial fibrillation at 85 bpm:  ASSESSMENT AND PLAN:  Active Problems:   Atrial fibrillation with RVR (HCC)    1.  Acute on chronic systolic congestive heart failure, BNP 1073.7, clinical improvement noted after diuresis with furosemide 40 mg IV 2.  Atrial  fibrillation with rapid ventricular rate, in the setting of acute on chronic systolic congestive heart failure, diltiazem 30 mg every 6 as needed, heart rate improved to 85 bpm, on Xarelto for stroke prevention, and amiodarone for rate and rhythm control 3.  Mild cardiomyopathy, LVEF 40% by echocardiogram 11/07/2019   Recommendations   1.  Agree with overall current therapy 2.  Continue diuresis as needed 3.  Carefully monitor renal status 4.  Continue Xarelto for stroke prevention 5.  Continue amiodarone 200 mg daily for rate and rhythm control 6.  DC diltiazem 7.  Add metoprolol succinate 25 mg daily     Isaias Cowman, MD, PhD, [facility] 11/01/2020 10:21 AM

## 2020-11-01 NOTE — Progress Notes (Signed)
*  PRELIMINARY RESULTS* Echocardiogram 2D Echocardiogram has been performed.  Frances Kent 11/01/2020, 3:47 PM

## 2020-11-01 NOTE — TOC Initial Note (Signed)
Transition of Care [facility] - Initial/Assessment Note    Patient Details  Name: Frances Kent MRN: [id number] Date of Birth: 12-Feb-1931  Transition of Care [facility]) CM/SW Contact:    Candie Chroman, LCSW Phone Number: 11/01/2020, 10:45 AM  Clinical Narrative:  Readmission prevention screen complete. CSW met with patient. Daughter at bedside. CSW introduced role and explained that discharge planning would be discussed. PCP is Deborra Medina, MD at [facility]. Daughter or other family members drive to appointments. Pharmacy is [facility] on [address]. No issues obtaining medications. No home health or DME use prior to admission. She does have a rollator at home if needed and has a built-in shower seat. Daughter asked about getting a wheelchair with foot rests. Sent secure chat to PT asking them to evaluate for this. Patient confirmed she has a scale at home and is aware of need to weigh daily to monitor fluid build up. No further concerns. CSW encouraged patient and her daughter to contact CSW as needed. CSW will continue to follow patient and her daughter for support and facilitate return home when stable.                Expected Discharge Plan: Home/Self Care Barriers to Discharge: Continued Medical Work up   Patient Goals and CMS Choice        Expected Discharge Plan and Services Expected Discharge Plan: Home/Self Care     Post Acute Care Choice: Durable Medical Equipment Living arrangements for the past 2 months: Single Family Home                                      Prior Living Arrangements/Services Living arrangements for the past 2 months: Single Family Home Lives with:: Relatives Patient language and need for interpreter reviewed:: Yes Do you feel safe going back to the place where you live?: Yes      Need for Family Participation in Patient Care: Yes (Comment)     Criminal Activity/Legal Involvement Pertinent to Current Situation/Hospitalization: No -  Comment as needed  Activities of Daily Living Home Assistive Devices/Equipment: Walker (specify type) ADL Screening (condition at time of admission) Patient's cognitive ability adequate to safely complete daily activities?: Yes Is the patient deaf or have difficulty hearing?: No Does the patient have difficulty seeing, even when wearing glasses/contacts?: No Does the patient have difficulty concentrating, remembering, or making decisions?: No Patient able to express need for assistance with ADLs?: Yes Does the patient have difficulty dressing or bathing?: No Independently performs ADLs?: Yes (appropriate for developmental age) Does the patient have difficulty walking or climbing stairs?: No Weakness of Legs: Both Weakness of Arms/Hands: None  Permission Sought/Granted Permission sought to share information with : Family Supports Permission granted to share information with : Yes, Verbal Permission Granted  Share Information with NAME: [name]     Permission granted to share info w Relationship: Daughter  Permission granted to share info w Contact Information: [phone]  Emotional Assessment Appearance:: Appears stated age Attitude/Demeanor/Rapport: Engaged, Gracious Affect (typically observed): Accepting, Appropriate, Calm, Pleasant Orientation: : Oriented to Self, Oriented to Place, Oriented to  Time, Oriented to Situation Alcohol / Substance Use: Not Applicable Psych Involvement: No (comment)  Admission diagnosis:  Shortness of breath [R06.02] SOB (shortness of breath) [R06.02] Atrial fibrillation with RVR (HCC) [I48.91] Atrial fibrillation, unspecified type ([place]) [I48.91] Community acquired pneumonia, unspecified laterality [J18.9] Acute congestive heart failure,  unspecified heart failure type [name]) [I50.9] Patient Active Problem List   Diagnosis Date Noted   Atrial fibrillation with RVR ([place]) 10/30/2020   A-fib ([place]) 10/16/2020   Antibiotic-associated diarrhea  10/15/2020   ILD (interstitial lung disease) ([place]) 09/22/2020   Tachyarrhythmia 09/08/2020   Sick sinus syndrome ([place]) 09/08/2020   Elevated serum free T4 level 09/08/2020   Purpura senilis ([place]) 08/28/2020   Hiatal hernia 08/09/2020   Atrial fibrillation with rapid ventricular response ([place]) 08/06/2020   Thrombophilia ([place]) 08/06/2020   History of COVID-19 03/03/2020   Schatzki's ring of distal esophagus 12/11/2019   Erosive gastritis 11/30/2019   Dysphagia 11/27/2019   Painful swallowing 11/27/2019   Gastroesophageal reflux disease 11/27/2019   Stage 3a chronic kidney disease ([place]) 04/11/2019   Coagulopathy ([place]) 09/12/2018   Essential hypertension 09/12/2018   CHF (congestive heart failure) ([place]) 08/15/2018   Hypothyroidism due to acquired atrophy of thyroid 05/08/2018   Unintentional weight loss 05/08/2018   Myalgia due to statin 05/08/2018   Abdominal aortic atherosclerosis ([place]) 05/07/2018   Bradycardia 04/28/2018   Pleural effusion on left 02/27/2018   Moderate mitral stenosis 01/10/2018   Lumbar radiculitis 01/07/2018   B12 deficiency 07/26/2017   Atrial fibrillation status post cardioversion [facility]) 04/06/2016   Hospital discharge follow-up 03/10/2016   Vitamin D deficiency 04/08/2015   Cervical spine degeneration 12/13/2014   History of pulmonary embolism 12/02/2014   Insomnia 10/04/2014   Moderate tricuspid insufficiency 09/17/2014   Generalized anxiety disorder 03/28/2013   Mitral valve prolapse 03/28/2013   Routine adult health maintenance 03/23/2012   Fatigue 03/23/2012   Cough 04/17/2011   Screening for breast cancer 11/29/2010   Macular degeneration, left eye 11/29/2010   Screening for colon cancer 11/29/2010   Hyperlipidemia LDL goal <160 11/29/2010   GERD (gastroesophageal reflux disease)    PCP:  [name], MD Pharmacy:   [facility] [address] [place] - [address] [place] [address] Phone: [phone] Fax:  [facility] - [place], [place] [address] Phone: [phone] Fax: [phone]  [facility] [place], [place] - [address] [place] [address] Phone: [phone] Fax: [phone]     Social Determinants of Health (SDOH) Interventions    Readmission Risk Interventions Readmission Risk Prevention Plan 11/01/2020  Transportation Screening Complete  PCP or Specialist Appt within 5-7 Days Complete  Medication Review (RN CM) Complete  Some recent data might be hidden

## 2020-11-01 NOTE — Evaluation (Signed)
Physical Therapy Evaluation Patient Details Name: Frances Kent MRN: [id number] DOB: 1930/04/13 Today's Date: 11/01/2020  History of Present Illness  Pt is an 87 y/o F with PMH: HTN, Afib, PE, and GERD. Pt presented via [facility] from home with SOB, weakness and heart palpitations. Pt adm for Afib with RVR and mild CHF exacerbation.  Clinical Impression  The pt presents with balance, gait, and endurance deficits. She is at increased risk of falls d/t scoring on 5xSTS (42.32s). She presents with generalized weakness evident during sit<>stand tranfers, pt requires use of hands to push on knees to achieve standing. The pt demonstrates improved gait and reports slightly improved endurance during gait with a RW as opposed to independently. At this time the pt would greatly benefit from continued PT to address deficits.      Recommendations for follow up therapy are one component of a multi-disciplinary discharge planning process, led by the attending physician.  Recommendations may be updated based on patient status, additional functional criteria and insurance authorization.  Follow Up Recommendations Home health PT (Pt and family report potential surgical procedure in the future, might need to hold until after surgical procedure to optimize mobility)    Equipment Recommendations  Rolling walker with 5" wheels    Recommendations for Other Services       Precautions / Restrictions Precautions Precautions: Fall Restrictions Weight Bearing Restrictions: No      Mobility  Bed Mobility Overal bed mobility: Modified Independent Bed Mobility: Supine to Sit     Supine to sit: HOB elevated;Supervision          Transfers Overall transfer level: Needs assistance Equipment used: None Transfers: Sit to/from Stand Sit to Stand: Supervision            Ambulation/Gait Ambulation/Gait assistance: Min guard Gait Distance (Feet): 20 Feet Assistive device: None       General Gait  Details: 9' without AD, pt with narrow BOS and slowed gait speed. 9' with RW, improved gait pattern and improved gait speed, still at risk of falls.  Stairs            Wheelchair Mobility    Modified Rankin (Stroke Patients Only)       Balance Overall balance assessment: Mild deficits observed, not formally tested   Sitting balance-Leahy Scale: Normal       Standing balance-Leahy Scale: Good                               Pertinent Vitals/Pain Pain Assessment: No/denies pain    Home Living Family/patient expects to be discharged to:: Private residence Living Arrangements: Other relatives Available Help at Discharge: Family;Available 24 hours/day Type of Home: House Home Access: Level entry     Home Layout: Laundry or work area in basement;Able to live on main level with bedroom/bathroom Home Equipment: Environmental consultant - 4 wheels;Shower seat;Grab bars - tub/shower Additional Comments: Reports not using rollator, does use grab bars and shower chair for safety.    Prior Function Level of Independence: Independent   Gait / Transfers Assistance Needed: reports ambulating short distances only.  ADL's / Homemaking Assistance Needed: Able to perform all I/ADLs. Has rarely driven lately in past 1-2 weeks d/t fatigue, but is able to drive at baseline. Family assists with laundry as it is in the basement        Hand Dominance   Dominant Hand: Left    Extremity/Trunk Assessment   Upper  Extremity Assessment Upper Extremity Assessment: Generalized weakness    Lower Extremity Assessment Lower Extremity Assessment: Generalized weakness       Communication   Communication: No difficulties  Cognition Arousal/Alertness: Awake/alert Behavior During Therapy: WFL for tasks assessed/performed Overall Cognitive Status: Within Functional Limits for tasks assessed                                        General Comments      Exercises Other  Exercises Other Exercises: 5xSTS: 43.32s, HR increase from 103 to 113, RPE 6 Other Exercises: RPE following gait trials 8/10   Assessment/Plan    PT Assessment Patient needs continued PT services  PT Problem List Decreased strength;Decreased mobility;Decreased balance;Decreased activity tolerance;Cardiopulmonary status limiting activity       PT Treatment Interventions Therapeutic activities;Gait training;Functional mobility training;Balance training;Therapeutic exercise;Patient/family education    PT Goals (Current goals can be found in the Care Plan section)  Acute Rehab PT Goals Patient Stated Goal: to get my heart better and go home PT Goal Formulation: With patient Time For Goal Achievement: 11/15/20 Potential to Achieve Goals: Good    Frequency Min 2X/week   Barriers to discharge        Co-evaluation               AM-PAC PT "6 Clicks" Mobility  Outcome Measure Help needed turning from your back to your side while in a flat bed without using bedrails?: None Help needed moving from lying on your back to sitting on the side of a flat bed without using bedrails?: A Little Help needed moving to and from a bed to a chair (including a wheelchair)?: A Little Help needed standing up from a chair using your arms (e.g., wheelchair or bedside chair)?: A Little Help needed to walk in hospital room?: A Little Help needed climbing 3-5 steps with a railing? : A Lot 6 Click Score: 18    End of Session Equipment Utilized During Treatment: Gait belt Activity Tolerance: Patient tolerated treatment well;Patient limited by fatigue Patient left: in bed;with bed alarm set;with family/visitor present   PT Visit Diagnosis: Unsteadiness on feet (R26.81);Muscle weakness (generalized) (M62.81);Difficulty in walking, not elsewhere classified (R26.2)    Time: [id number] PT Time Calculation (min) (ACUTE ONLY): 32 min   Charges:   PT Evaluation $PT Eval Moderate Complexity: 1 Mod PT  Treatments $Gait Training: 8-22 mins        3:35 PM, 11/01/20 Anwar Sakata A. Saverio Danker PT, DPT Physical Therapist - [facility]   Lorrie Gargan A Miyeko Mahlum 11/01/2020, 3:32 PM

## 2020-11-01 NOTE — Progress Notes (Signed)
Was called into room by patient's daughter stating that she was having trouble seeing out of her left eye. Checked pupils and they were equal and reactive at the time. Did full neuro assessment with no deficits noted. Also got a set of vital signs and they were stable. While I was still in the room talking to the patient and daughter, the patient started having garbled speech. I began neuro assessment again. Asked patient she tell me where she was and she was just mumbling and I couldn't understand her. She also started getting very drowsy. She would not grip with her right hand. Rapid response called and stroke alert initiated. Taken for stat head ct.

## 2020-11-01 NOTE — Consult Note (Addendum)
NAME:  Frances Kent, MRN:  [id number], DOB:  06/13/1930, LOS: 0 ADMISSION DATE:  10/30/2020, CONSULTATION DATE:  9/18 REFERRING MD:  Dr. Curly Shores, REASON FOR CONSULT:  Vent management and cardiac management   History of Present Illness:  Patient is encephalopathic and/or intubated. Therefore history has been obtained from chart review.   Frances Kent is a 87 y.o. female who is seen in consultation at the request of Dr. Curly Shores for recommendations on further evaluation and management of the ventilator and cardiac management.    Frances Kent is an 87 y.o. F with a pertinent PMX of Afib/svt on xarelto, PE, HTN, hypothyroidism, anxiety, GERD.  She was admitted on 9/16 for acute on chronic CHF exacerbation, LLL pneumonia and A.fib with RVR. Cardiology was consulted. She was treated with a cardizem gtt, IV lasix, ceftriaxone, and azithromycin.  She had a brief period of hypotension on 9/17-9/18 which was believed to be iatrogenic. A code stroke was called the evening of 9/18. LKW was 1800. The patient developed global aphasia and R hemipelegia. Xarelto was given on 9/18, not a TNK candidate. CTA Head and neck showed a L ICA occlusion with reconstitution and a L M1 cutoff. NIHSS 20. She was transferred from [facility] to [facility] for ICU needs  She was taken to [facility]. A mechanical thrombectomy was preformed of the L ICA, TICI 3 with two passes.  She remains critically ill in the 4N ICU  Pertinent  Medical History  Afib/svt on xarelto, PE, HTN, hypothyroidism, anxiety, GERD.  Significant Hospital Events: Including procedures, antibiotic start and stop dates in addition to other pertinent events   9/16 Admit to [facility]. Ceftriaxone. Cards consult 9/18 Code stroke, txr to [facility],  mechanical thrombectomy was preformed of the L ICA, TICI 3 with two passes.  Interim History / Subjective:  See above  Intubated/Sedated  On 43mg propofol at time of exam  Unable to obtain subjective evaluation due  to patient status  Objective   Blood pressure (!) 105/57, pulse 94, temperature 98 F (36.7 C), temperature source Oral, resp. rate 16, SpO2 99 %.    Vent Mode: PRVC FiO2 (%):  [40 %-100 %] 40 % Set Rate:  [14 bmp] 14 bmp Vt Set:  [420 mL] 420 mL PEEP:  [5 cmH20] 5 cmH20 Plateau Pressure:  [17 cmH20] 17 cmH20   Intake/Output Summary (Last 24 hours) at 11/01/2020 2240 Last data filed at 11/01/2020 2149 Gross per 24 hour  Intake 275 ml  Output 50 ml  Net 225 ml   There were no vitals filed for this visit.  Examination: General:  in bed, lying flat post IR, grimacing HEENT: MM pink/moist, anicteric, atraumatic Neuro: sedated, RASS -4, PERRL 219mCV: S1S2, Afib, no m/r/g appreciated PULM:  clear in the upper lobes, diminished in the lower lobes, Trachea midline, chest expansion symmetric GI: soft, bsx4 active, non distended Extremities: warm/dry, no pretibial edema, capillary refill less than 3 seconds  Skin: RT groin site c/d/I, soft  Resolved Hospital Problem list     Assessment & Plan:  Acute L ICA and L M1 occlusion S/p NIR, mechanical thrombectomy was preformed of the L ICA, TICI 3 with two passes. -Management and work up per neurology/NIR/primary -Goal blood pressure 120-140. Cleviprex ordered. titrate medicine to goal blood pressure. -ASA/Statin per primary -[facility] -Follow up ultrasounds -Follow up MRI when able -Continue neuro watch -Goal RASS 0 to -1 -Continue neuroprotective measures: normothermia, euglycemia,  HOB greater than 30 when able post procedure, head in neutral alignment, normocapnia, normoxia.  -Trend CBC and BMET -Aspirations precautions   Mechanical ventilation, s/p intubation for procedure Secondary to L ICA and L M1 occlusion. Deemed high risk to extubate by anesthesia per NIR notes. CXR stable ETT, bibasilar atelactasis. -LTVV strategy with tidal volumes of 4-8 cc/kg ideal body weight -Goal plateau  pressures less than 30 and driving pressures less than 15 -Wean PEEP/FiO2 for SpO2 92-98% -VAP bundle -Daily SAT and SBT -PAD bundle with Propofol gtt and fentanyl push -Goal RASS 0 to -1. Hope to extubate in AM. -Follow intermittent CXR and ABG PRN -Obtain CXR now and ABG 1 hour post arrival to ICU  Acute on chronic systolic CHF Permanent A.fib with RVR Prolonged QT Was on xarelto for ac. Afib on monitor, no RVR. Trop 10>9. BNP 418. MG 2.1, K 4.7. QTC 588 on 9/18 -BP goals/cleviprex as discussed -Holding lasix at this time - on amio gtt -Follow up ECHO -Nursing unable to obtain enteral access. If extubated in AM plan to restart PO amio and metoprolol. If RVR develops plan to load amio and start gtt -holding AC at this time in the setting of stroke and IR intervention. -Consider cardiology consult -Limit QTC prolonging drugs -Goal K above 4, goal MG above 2  Community aquired PNA, POA Left lower lobe, Lactate 1.2, PCT less than .10, WBC 7.8 -Continue ceftriaxone for 5 days total (3/5), azithro d/c'd by hospitalist due to prolonged qtc -Follow fever WBC curve  CKD 3 -Ensure renal perfusion. Goal MAP 65 or greater. -Avoid neprotoxic drugs as possible. -Strict I&O's -Follow up AM creatinine  HX GERD -PPI  HX PE -Holding AC as discussed  Anxiety -holding home xanax  Hypothyroidism -Continue synthroid  Best Practice (right click and "Reselect all SmartList Selections" daily)   Diet/type: NPO w/ meds via tube DVT prophylaxis: SCD GI prophylaxis: PPI Lines: N/A Foley:  N/A Code Status:  full code Last date of multidisciplinary goals of care discussion [pending]  Labs   CBC: Recent Labs  Lab 10/30/20 0926 10/31/20 0658 11/01/20 0532 11/01/20 2209  WBC 10.6* 8.3 7.8  --   HGB 11.4* 10.8* 10.3* 10.2*  HCT 36.0 33.4* 32.4* 30.0*  MCV 87.6 87.7 88.8  --   PLT 507* 434* 396  --     Basic Metabolic Panel: Recent Labs  Lab 10/30/20 0939 10/31/20 0658  11/01/20 0532 11/01/20 2209  NA 138 137 138 138  K 4.2 3.6 4.7 3.5  CL 106 102 105  --   CO2 20* 28 28  --   GLUCOSE 120* 99 103*  --   BUN '18 18 18  '$ --   CREATININE 0.91 1.03* 1.01*  --   CALCIUM 8.4* 8.3* 8.4*  --   MG  --  2.1 2.1  --   PHOS  --   --  2.6  --    GFR: Estimated Creatinine Clearance: 26.3 mL/min (A) (by C-G formula based on SCr of 1.01 mg/dL (H)). Recent Labs  Lab 10/30/20 0926 10/30/20 1046 10/31/20 0658 10/31/20 1148 10/31/20 1639 11/01/20 0532 11/01/20 0846  PROCALCITON  --   --  0.11  --   --  <0.10  --   WBC 10.6*  --  8.3  --   --  7.8  --   LATICACIDVEN  --    < >  --  2.8* 2.5* 1.1 1.2   < > = values in this interval  not displayed.    Liver Function Tests: Recent Labs  Lab 10/30/20 0939 11/01/20 0532  AST 24  --   ALT 15  --   ALKPHOS 89  --   BILITOT 1.8*  --   PROT 6.8  --   ALBUMIN 3.3* 2.6*   No results for input(s): LIPASE, AMYLASE in the last 168 hours. No results for input(s): AMMONIA in the last 168 hours.  ABG    Component Value Date/Time   PHART 7.441 11/01/2020 2209   PCO2ART 37.6 11/01/2020 2209   PO2ART 337 (H) 11/01/2020 2209   HCO3 25.7 11/01/2020 2209   TCO2 27 11/01/2020 2209   ACIDBASEDEF 2.6 (H) 03/04/2016 0855   O2SAT 100.0 11/01/2020 2209     Coagulation Profile: No results for input(s): INR, PROTIME in the last 168 hours.  Cardiac Enzymes: No results for input(s): CKTOTAL, CKMB, CKMBINDEX, TROPONINI in the last 168 hours.  HbA1C: Hgb A1c MFr Bld  Date/Time Value Ref Range Status  10/16/2020 06:59 AM 6.1 (H) 4.8 - 5.6 % Final    Comment:    (NOTE) Pre diabetes:          5.7%-6.4%  Diabetes:              >6.4%  Glycemic control for   <7.0% adults with diabetes     CBG: No results for input(s): GLUCAP in the last 168 hours.  Review of Systems:   Unable to obtain ROS due to patient status  Past Medical History:  She,  has a past medical history of Atrial fibrillation ([place]), Benign breast  cyst in female, left (10/07/2016), Colon adenomas, GERD (gastroesophageal reflux disease), Hypertension, Hypothyroidism, Macular degeneration, Mitral regurgitation, Pulmonary embolism ([place]) (02/2016), and SVT (supraventricular tachycardia) ([place]).   Surgical History:   Past Surgical History:  Procedure Laterality Date   APPENDECTOMY  1960   BREAST CYST ASPIRATION Left 2017   CARDIOVERSION N/A 04/05/2016   Procedure: Cardioversion;  Surgeon: [name], MD;  Location: [facility] ORS;  Service: Cardiovascular;  Laterality: N/A;   CHOLECYSTECTOMY  1985   ESOPHAGOGASTRODUODENOSCOPY (EGD) WITH PROPOFOL N/A 11/27/2019   Procedure: ESOPHAGOGASTRODUODENOSCOPY (EGD) WITH PROPOFOL;  Surgeon: Lesly Rubenstein, MD;  Location: [facility] ENDOSCOPY;  Service: Endoscopy;  Laterality: N/A;   ESOPHAGOGASTRODUODENOSCOPY (EGD) WITH PROPOFOL N/A 12/24/2019   Procedure: ESOPHAGOGASTRODUODENOSCOPY (EGD) WITH PROPOFOL;  Surgeon: Lesly Rubenstein, MD;  Location: [facility] ENDOSCOPY;  Service: Endoscopy;  Laterality: N/A;   OVARIAN CYST REMOVAL     TEE WITHOUT CARDIOVERSION N/A 02/01/2018   Procedure: TRANSESOPHAGEAL ECHOCARDIOGRAM (TEE);  Surgeon: [name], MD;  Location: [facility] ORS;  Service: Cardiovascular;  Laterality: N/A;     Social History:   reports that she has never smoked. She has never used smokeless tobacco. She reports that she does not drink alcohol and does not use drugs.   Family History:  Her family history includes Breast cancer (age of onset: 82) in her mother; Cancer (age of onset: 89) in her son; Cancer (age of onset: 50) in her mother; Heart disease in her son; Heart disease (age of onset: 74) in her maternal grandmother; Stroke in her sister.   Allergies Allergies  Allergen Reactions   Statins     Severe myalgias     Home Medications  Prior to Admission medications   Medication Sig Start Date End Date Taking? Authorizing Provider  acetaminophen (TYLENOL) 500 MG tablet Take 500 mg by  mouth every 6 (six) hours as needed.    [provider]  ALPRAZolam (XANAX) 0.25 MG tablet Take 1 tablet (0.25 mg total) by mouth at bedtime as needed for anxiety or sleep. 08/26/20   [name], MD  amiodarone (PACERONE) 200 MG tablet Take 200 mg by mouth daily.    [provider]  benzonatate (TESSALON) 200 MG capsule Take 1 capsule (200 mg total) by mouth 3 (three) times daily as needed for cough. 09/22/20   [name], MD  cefTRIAXone 2 g in sodium chloride 0.9 % 100 mL Inject 2 g into the vein daily for 2 days. 11/02/20 11/04/20  Mercy Riding, MD  chlorpheniramine-HYDROcodone (TUSSIONEX) 10-8 MG/5ML SUER Take 5 mLs by mouth every 12 (twelve) hours as needed for cough. 10/16/20   Florencia Reasons, MD  diltiazem (CARDIZEM) 30 MG tablet Take 1 tablet (30 mg total) by mouth every 8 (eight) hours. Patient taking differently: Take 30 mg by mouth 2 (two) times daily. 10/16/20   Florencia Reasons, MD  esomeprazole (NEXIUM) 40 MG capsule Take 1 capsule (40 mg total) by mouth daily. 09/22/20   [name], MD  ipratropium (ATROVENT) 0.02 % nebulizer solution Take 2.5 mLs (0.5 mg total) by nebulization every 4 (four) hours as needed for wheezing or shortness of breath (cough). 10/16/20   Florencia Reasons, MD  levothyroxine (EUTHYROX) 50 MCG tablet TAKE 1 TABLET BY MOUTH ONCE DAILY 30  MINUTES  PRIOR  TO  EATING; 09/11/20   [name], MD  potassium chloride SA (KLOR-CON) 20 MEQ tablet Take 20 mEq by mouth daily. 10/26/20   [provider]  XARELTO 15 MG TABS tablet TAKE 1 TABLET EVERY DAY 08/13/20   [name], MD     Critical care time: 64 minutes    [employer]., MSN, APRN, AGACNP-BC [facility]  11/01/2020 , 10:48 PM  Please see [email or web address] for pager details  If no response, please call [phone] After hours, please call Elink at [phone]

## 2020-11-01 NOTE — Anesthesia Procedure Notes (Signed)
Arterial Line Insertion Start/End9/18/2022 8:10 PM, 11/01/2020 8:21 PM Performed by: Clovis Cao, CRNA, CRNA  Patient location: OR. Preanesthetic checklist: patient identified, IV checked, site marked, risks and benefits discussed, surgical consent, monitors and equipment checked, pre-op evaluation, timeout performed and anesthesia consent Lidocaine 1% used for infiltration Left, radial was placed Catheter size: 20 G Hand hygiene performed  and maximum sterile barriers used   Attempts: 1 Procedure performed without using ultrasound guided technique. Ultrasound Notes:anatomy identified, needle tip was noted to be adjacent to the nerve/plexus identified and no ultrasound evidence of intravascular and/or intraneural injection Following insertion, dressing applied and Biopatch. Post procedure assessment: normal and unchanged  Patient tolerated the procedure well with no immediate complications.

## 2020-11-01 NOTE — H&P (Signed)
Neurology H&P  CC: Right ICA occlusion  History is obtained from: Chart review and Dr. Quinn Axe  HPI: Frances Kent is a 87 y.o. female with past medical history of atrial fibrillation on Xarelto, Mitral valve stenosis w/ regurgitation, PE, hypertension, hypothyroidism, anxiety.  She initially presented to [facility] hospital on 9/18 with worsening shortness of breath, palpitations, fatigue and edema that had started on 8/17, admitted for atrial fibrillation with RVR concern for potential left lower lobe pneumonia with a mildly elevated BNP from baseline of 900 to 1000 concerning for acute decompensated heart failure.  She was treated with Cardizem, Lasix, ceftriaxone and azithromycin and cardiology was following.   At noon today she was briefly hypotensive to 82/60, with near syncope but still at her baseline level of function until at least 6 PM.  Troponin was negative x2 and chest x-ray was stable.  EKG was notable for QTC 588, generally stable from 9/16  Shortly after 6 PM she was discovered with right-sided weakness and aphasia for which code stroke was activated  Additional ROS per family Weight loss 111 -> 95 lbs in the last few months -- confirmed on review of office records, 106 lbs on 09/10/20 Diarrhea recently attributed to amoxicillin for pneumonia (continuing) Pedal edema has been improving during her hospital stay No missed doses of Xarelto at home, and none missed in hospital per review of records  At baseline, she walks with a walker in town, but completely independently at home. Stopped driving due to intermittent weakness attributed to Afib w/ RVR episodes in the past 1 year, she manages her medications and finances, no memory issues. Planned for consideration of re-ablation vs. possible pacemaker evaluation  On review of cardiology records, her mitral valve stenosis with regurgitation has been felt to be significantly contributing to her intermittent symptoms and weakness, but patient  has had a preference to avoid surgical intervention  LKW: 6 PM tPA given?: No, due to Xarelto IA performed?:  Yes, transferred to [facility] for this procedure Premorbid modified rankin scale: 0 - 1     0 - No symptoms.     1 - No significant disability. Able to carry out all usual activities, despite some symptoms. Neurologist evaluation completed immediately on patient's arrival to [facility], prior to IR  ROS: Unable to obtain due to altered mental status, obtained from chart review as able, as above.   Past Medical History:  Diagnosis Date   Atrial fibrillation ([place])    Benign breast cyst in female, left 10/07/2016   Colon adenomas    GERD (gastroesophageal reflux disease)    Hypertension    Hypothyroidism    Macular degeneration    Mitral regurgitation    Pulmonary embolism ([place]) 02/2016   SVT (supraventricular tachycardia) ([place])    Past Surgical History:  Procedure Laterality Date   APPENDECTOMY  1960   BREAST CYST ASPIRATION Left 2017   CARDIOVERSION N/A 04/05/2016   Procedure: Cardioversion;  Surgeon: [name], MD;  Location: [facility] ORS;  Service: Cardiovascular;  Laterality: N/A;   CHOLECYSTECTOMY  1985   ESOPHAGOGASTRODUODENOSCOPY (EGD) WITH PROPOFOL N/A 11/27/2019   Procedure: ESOPHAGOGASTRODUODENOSCOPY (EGD) WITH PROPOFOL;  Surgeon: Lesly Rubenstein, MD;  Location: [facility] ENDOSCOPY;  Service: Endoscopy;  Laterality: N/A;   ESOPHAGOGASTRODUODENOSCOPY (EGD) WITH PROPOFOL N/A 12/24/2019   Procedure: ESOPHAGOGASTRODUODENOSCOPY (EGD) WITH PROPOFOL;  Surgeon: Lesly Rubenstein, MD;  Location: [facility] ENDOSCOPY;  Service: Endoscopy;  Laterality: N/A;   OVARIAN CYST REMOVAL  TEE WITHOUT CARDIOVERSION N/A 02/01/2018   Procedure: TRANSESOPHAGEAL ECHOCARDIOGRAM (TEE);  Surgeon: [name], MD;  Location: [facility] ORS;  Service: Cardiovascular;  Laterality: N/A;   No current facility-administered medications for this encounter.  Current Outpatient  Medications:    acetaminophen (TYLENOL) 500 MG tablet, Take 500 mg by mouth every 6 (six) hours as needed., Disp: , Rfl:    ALPRAZolam (XANAX) 0.25 MG tablet, Take 1 tablet (0.25 mg total) by mouth at bedtime as needed for anxiety or sleep., Disp: 20 tablet, Rfl: 3   amiodarone (PACERONE) 200 MG tablet, Take 200 mg by mouth daily., Disp: , Rfl:    benzonatate (TESSALON) 200 MG capsule, Take 1 capsule (200 mg total) by mouth 3 (three) times daily as needed for cough., Disp: 60 capsule, Rfl: 1   [START ON 11/02/2020] cefTRIAXone 2 g in sodium chloride 0.9 % 100 mL, Inject 2 g into the vein daily for 2 days., Disp: , Rfl:    chlorpheniramine-HYDROcodone (TUSSIONEX) 10-8 MG/5ML SUER, Take 5 mLs by mouth every 12 (twelve) hours as needed for cough., Disp: 140 mL, Rfl: 0   diltiazem (CARDIZEM) 30 MG tablet, Take 1 tablet (30 mg total) by mouth every 8 (eight) hours. (Patient taking differently: Take 30 mg by mouth 2 (two) times daily.), Disp: 90 tablet, Rfl: 0   esomeprazole (NEXIUM) 40 MG capsule, Take 1 capsule (40 mg total) by mouth daily., Disp: 30 capsule, Rfl: 3   ipratropium (ATROVENT) 0.02 % nebulizer solution, Take 2.5 mLs (0.5 mg total) by nebulization every 4 (four) hours as needed for wheezing or shortness of breath (cough)., Disp: 75 mL, Rfl: 0   levothyroxine (EUTHYROX) 50 MCG tablet, TAKE 1 TABLET BY MOUTH ONCE DAILY 30  MINUTES  PRIOR  TO  EATING;, Disp: 90 tablet, Rfl: 0   potassium chloride SA (KLOR-CON) 20 MEQ tablet, Take 20 mEq by mouth daily., Disp: , Rfl:    XARELTO 15 MG TABS tablet, TAKE 1 TABLET EVERY DAY, Disp: 60 tablet, Rfl: 1  Facility-Administered Medications Ordered in Other Encounters:    acetaminophen (TYLENOL) tablet 650 mg, 650 mg, Oral, Q6H PRN **OR** acetaminophen (TYLENOL) suppository 650 mg, 650 mg, Rectal, Q6H PRN, [name], Shayan S, DO   ALPRAZolam (XANAX) tablet 0.25 mg, 0.25 mg, Oral, QHS PRN, [name], Shayan S, DO   amiodarone (PACERONE) tablet 200 mg, 200 mg, Oral,  Daily, Anwar, Shayan S, DO, 200 mg at 11/01/20 1010   benzonatate (TESSALON) capsule 200 mg, 200 mg, Oral, TID PRN, [name], Shayan S, DO   cefTRIAXone (ROCEPHIN) 2 g in sodium chloride 0.9 % 100 mL IVPB, 2 g, Intravenous, Q24H, Lavonia Drafts, MD, Stopped at 11/01/20 1054   chlorpheniramine-HYDROcodone (TUSSIONEX) 10-8 MG/5ML suspension 5 mL, 5 mL, Oral, Q12H PRN, [name], Shayan S, DO   HYDROcodone-acetaminophen (NORCO/VICODIN) 5-325 MG per tablet 1-2 tablet, 1-2 tablet, Oral, Q4H PRN, [name], Shayan S, DO   ipratropium (ATROVENT) nebulizer solution 0.5 mg, 0.5 mg, Nebulization, Q4H PRN, Anwar, Shayan S, DO   levothyroxine (SYNTHROID) tablet 50 mcg, 50 mcg, Oral, Q0600, Anwar, Shayan S, DO, 50 mcg at 11/01/20 0544   melatonin tablet 5 mg, 5 mg, Oral, QHS, Gonfa, Taye T, MD   metoprolol succinate (TOPROL-XL) 24 hr tablet 25 mg, 25 mg, Oral, Daily, Gonfa, Taye T, MD, 25 mg at 11/01/20 1010   morphine 2 MG/ML injection 2 mg, 2 mg, Intravenous, Q2H PRN, Anwar, Shayan S, DO   pantoprazole (PROTONIX) EC tablet 40 mg, 40 mg, Oral, Daily, Anwar,  Shayan S, DO, 40 mg at 11/01/20 1010   polyethylene glycol (MIRALAX / GLYCOLAX) packet 17 g, 17 g, Oral, Daily PRN, Imagene Sheller S, DO   Rivaroxaban (XARELTO) tablet 15 mg, 15 mg, Oral, Daily, Anwar, Shayan S, DO, 15 mg at 11/01/20 1010   Family History  Problem Relation Age of Onset   Cancer Mother 15       breast cancer, lived to 79,    Breast cancer Mother 30   Cancer Son 41       pancreatic cancer   Heart disease Son        CAD, Tobacco Abuse    Heart disease Maternal Grandmother 80       died of massive MI   Stroke Sister     Social History:  reports that she has never smoked. She has never used smokeless tobacco. She reports that she does not drink alcohol and does not use drugs.   Exam: Current vital signs: There were no vitals taken for this visit. Vital signs in last 24 hours: Temp:  [97.6 F (36.4 C)-98.7 F (37.1 C)] 97.9 F (36.6 C)  (09/18 1923) Pulse Rate:  [83-99] 98 (09/18 1923) Resp:  [15-20] 18 (09/18 1923) BP: (82-152)/(60-112) 144/90 (09/18 1923) SpO2:  [93 %-100 %] 97 % (09/18 1923) Weight:  [43.2 kg] 43.2 kg (09/18 0500)   Physical Exam  Constitutional: Appears elderly and ill Psych: Minimally interactive Eyes: No scleral injection HENT: No oropharyngeal obstruction.  MSK: no joint deformities.  Cardiovascular: Irregularly irregular rhythm Respiratory: Moderately effortful breathing GI: Soft.  No distension. There is no tenderness.  Skin: Warm dry and intact visible skin  Neuro: Mental Status: Sleeping, arouses to repeated noxious stimulation and briefly maintains alertness.  Able to state her name otherwise no verbal output.  Does not follow any commands. Cranial Nerves: II: Visual Fields are notable for a right hemianopia to blink to threat. Pupils are equal, round, and reactive to light.   III,IV, VI: EOMI without left gaze preference, does not cross past midline V: Facial sensation is symmetric to temperature VII: Facial movement is symmetric.  VIII: hearing is intact to voice X: Uvula elevates symmetrically XI: Shoulder shrug is symmetric. XII: tongue is midline without atrophy or fasciculations.  Motor: Tone is low on the right.  Localizes with the left upper extremity.  Withdraws in the bilateral lower extremities with slight withdrawal in the right upper extremity Sensory: Markedly reduced response to noxious stimulation on the right compared to the left Cerebellar: Unable to assess given mental status NIHSS total 26 Score breakdown: 2 points for drowsiness, 2 points for not answering questions, 2 points for not following commands, one-point for gaze preference to the left, 2 points for right hemianopia, 2 points for right facial droop, 2 points for left upper extremity weakness, 3 points for right upper extremity weakness, 3 points for left lower extremity weakness, 3 points for right  lower extremity weakness, one-point for sensory loss on the right side, 2 points for severe aphasia, one-point for mild dysarthria stating her name    I have reviewed labs in epic and the results pertinent to this consultation are:   Basic Metabolic Panel: Recent Labs  Lab 10/30/20 0939 10/31/20 0658 11/01/20 0532  NA 138 137 138  K 4.2 3.6 4.7  CL 106 102 105  CO2 20* 28 28  GLUCOSE 120* 99 103*  BUN '18 18 18  '$ CREATININE 0.91 1.03* 1.01*  CALCIUM 8.4* 8.3* 8.4*  MG  --  2.1 2.1  PHOS  --   --  2.6    CBC: Recent Labs  Lab 10/30/20 0926 10/31/20 0658 11/01/20 0532  WBC 10.6* 8.3 7.8  HGB 11.4* 10.8* 10.3*  HCT 36.0 33.4* 32.4*  MCV 87.6 87.7 88.8  PLT 507* 434* 396    Lab Results  Component Value Date   HGBA1C 6.1 (H) 10/16/2020   Lab Results  Component Value Date   CHOL 284 (H) 12/11/2019   HDL 100.10 12/11/2019   LDLCALC 166 (H) 12/11/2019   LDLDIRECT 185.3 03/27/2013   TRIG 91.0 12/11/2019   CHOLHDL 3 12/11/2019     I have reviewed the images obtained: Head CT negative for acute intracranial process CTA with left ICA occlusion into the proximal M1 with distal reconstitution  8/22 CT chest radiology read: 1. The appearance of the lungs is most suggestive of an alternative diagnosis (not usual interstitial pneumonia) per current ATS guidelines. Findings are unusual and nonspecific, but favored to reflect an underlying infectious process. If there is persistent clinical concern for interstitial lung disease, repeat high-resolution chest CT should be considered in 6-12 months to assess for temporal changes in the appearance of the lung parenchyma. 2. Cardiomegaly with left atrial dilatation. 3. Aortic atherosclerosis, in addition to 2 vessel coronary artery disease. 4. There are calcifications of the aortic valve and mitral valve/annulus. Echocardiographic correlation for evaluation of potential valvular dysfunction may be warranted if clinically  indicated.  Assessment: Acute embolic right MCA stroke likely secondary to atrial fibrillation  Plan:  CNS  Acute ischemic left ICA stroke > Emergently transferred from [facility] for thrombectomy,  # Acute ischemic stroke secondary to left carotid occlusion stroke, etiology likely atrial fibrillation despite anticoagulation - Stroke labs HgbA1c, fasting lipid panel - MRI brain postintervention when stable - MRA of the brain without contrast post intervention when stable - Frequent neuro checks - Echocardiogram completed at [facility], read pending - Hold antiplatelets and anticoagulation given recent procedure, start per stroke team  -Consider change from Xarelto to Eliquis given LVO on Xarelto - Risk factor modification - Blood pressure goal   - Post successful uncomplicated revascularization SBP 120 - 140 for 24 hours; if complications have arisen or only partial revascularization reach out to interventionalist or neurologist on call for BP goal - PT consult, OT consult, Speech consult, when patient stabilized - Admitted to stroke team  Anxiety - Hold home Xanax 0.25 mg twice daily for now, reconcile frequency of use with family and monitor for withdrawal  RESP Acute Respiratory Failure  -vent management per ICU -wean when able  History of PE -Resume anticoagulation when safe to do so from a neurological perspective, hold for now  CV  Mitral valve stenosis with mitral valve regurgitation Acute on chronic systolic (congestive) heart failure  -TTE as above -Continue BB as per plan for atrial fibrillation -Consider cardiology consult  Chronic atrial -fibrillation -Rate control -Continue BB, convert 50 mg XL formulation to IV 1.25 mg every 6 hours -Hold anticoagulation pending repeat imaging to confirm final stroke size -Appreciate CCM management of amiodarone and diltiazem  Prolonged QTC to 588  -High goal electrolytes (potassium greater than 4, magnesium greater than 2) -Hold  further azithromycin -Continue to monitor -Appreciate CCM input   GI/GU Acute Kidney Failure CKD stage IIIA -Gentle hydration given large contrast bolus, cautious given recent decompensated heart failure, will involve medicine/CCM team -avoid nephrotoxic agents  HEME Anemia, unspecified -Monitor -transfuse for hgb < 7  Coagulopathy secondary to anticoagulation -Hold anticoagulation for now as per atrial fibrillation plan  ENDO  Hypothyroidism -Continue home Synthroid  Prediabetes, goal euglycemia -SSI  -goal HgbA1c < 7  Lab Results  Component Value Date   HGBA1C 6.1 (H) 10/16/2020     Fluid/Electrolyte Disorders No active issues -Replete as needed -Repeat labs -Trend  ID  Community acquired pneumonia Possible Aspiration PNA -Continue ceftriaxone for 2 more days to complete 5-day course (last dose 9/20), azithromycin 3-day course completed secondary to QTC  Diarrhea  -Order C. Diff if continues to have frequent stools  Nutrition E66.9 Obesity  E46 Protein-Calorie Malnutrition Mild Moderate Severe -diet consult  Prophylaxis DVT: SCDs GI: Pantoprazole Bowel: Senna  Dispo: IP Rehab SNF LTAC Home with 24 hour supervision  Diet: NPO until cleared by speech  Code Status: Full Code    THE FOLLOWING WERE PRESENT ON ADMISSION: CNS -  Acute Ischemic Stroke, Hemiplegia,  Respiratory - Community Acquired Pneumonia, Possible Aspiration Pneumonia, Non-invasive Mech Ventilation (BiPAP) Cardiovascular - Acute Chronic Systolic Diastolic CHF, Afib w/ RVR Renal -  Mild acute kidney injury Heme-  Coagulopathy secondary to Xarelto use  Lesleigh Noe MD-PhD [facility] [phone] Available 7 PM to 7 AM, outside of these hours please call Neurologist on call as listed on Amion.  Total critical care time: 45 minutes   Critical care time was exclusive of separately billable procedures and treating other patients.   Critical care was necessary to  treat or prevent imminent or life-threatening deterioration.   Critical care was time spent personally by me on the following activities: development of treatment plan with patient and/or surrogate as well as nursing, discussions with consultants/primary team, evaluation of patient's response to treatment, examination of patient, obtaining history from patient or surrogate, ordering and performing treatments and interventions, ordering and review of laboratory studies, ordering and review of radiographic studies, and re-evaluation of patient's condition as needed, as documented above.

## 2020-11-01 NOTE — Discharge Summary (Signed)
Physician Discharge Summary  Frances Kent [id number] DOB: [birth year] DOA: 10/30/2020  PCP: [name], MD  Admit date: 10/30/2020 Discharge date: 11/01/2020 Admitted From: Home Disposition: Transfer to [facility] neuro ICU  CODE STATUS: Full code  Hospital Course: 87 year old F with PMH of A. fib/SVT on Xarelto, PE, HTN, hypothyroidism, anxiety and GERD presenting with worsening shortness of breath, palpitation, fatigue and edema for 1 day, and admitted for A. fib with RVR, acute on chronic systolic CHF and possible LLL pneumonia.  BNP elevated to 1000 (baseline of 900).  CXR raises concern for LLL pneumonia or CHF.  Started on Cardizem drip, IV Lasix, CTX and azithromycin.  Cardiology consulted.   Patient improved quickly with diuretics and control of a heart rate.  Transitioned to p.o. meds. TTE pending.  Patient definitely new neurologic deficits with acute onset global aphasia, vision change left eye and right hemiplegia just after 6 PM.  Code stroke activated.  She was examined by my colleague, Dr. Roderic Palau and teleneurology, Dr. Quinn Axe.  She was noted to be posturing with right arm.  Stat CT head without contrast in ICE, no ICH, aspect 10.  CTA head and neck showed left ICA occlusion with reconstitution's and left M1 cutoff.  Neurology discussed finding with patient's family, neuro interventionist at [facility] and neurologist at [facility], and recommended transfer for IR thrombectomy.   See individual problem list below for more on hospital course.  Discharge Diagnoses:  Symptomatic left ICA occlusion-no acute CVA on CT head without contrast with CTA head.  NIHSS cardiology 20.  Not a candidate for tPA as patient is on Xarelto for A. fib.  Likely unmasked by low blood pressure. -Transfer to neuro ICU at [facility] for neuro IR intervention -Dr. Curly Shores  and Dr. Earleen Newport aware of patient   Acute on chronic systolic CHF: TTE in [id number] with LVEF of 40 to [id number], normal diastolic  function, mod MV stenosis.  Unclear if CHF provoked A. fib or vice versa.  BNP elevated to 1100.  CXR with consistent with CHF.  Symptoms and BNP improved with IV Lasix.  I&O incomplete.  Weight down. -Received IV Lasix 40 mg early in the morning -Reassess need of diuretics in the morning -Follow echocardiogram -GDMT-started Toprol-XL 25 mg daily.  Stop scheduled Cardizem -Sodium and fluid restriction -Monitor fluid status, renal functions and electrolytes.   Permanent A. fib with RVR: RVR resolved.  CHA2DS2-VASc score 6.  On amiodarone, Cardizem and Xarelto.  Since she was prescribed Cardizem 30 mg 3 times daily but has been taking twice daily.  She is on Xarelto for anticoagulation.  TSH within normal. -Appreciate guidance by cardiology -Added Toprol-XL 25 mg daily-given systolic CHF. -Changed Cardizem to as needed for sustained HR> 120 if BP allows -Continue home amiodarone. -On Xarelto for anticoagulation   Hypotension/near syncope: Likely LVO, diuretics and cardiac medications troponin negative.  EKG without acute ischemic finding but prolonged QT.  -Management as above   Prolonged QT: QTc prolonged to 588 but exaggerated by wide QRS and tachycardia.  K4.7.  Mg 2.1. -Continue telemetry monitoring -Discontinue azithromycin and as needed Zofran -Optimize K and Mg -Avoid QT prolonging drugs -Toprol as above.   Severe sepsis due to community-acquired pneumonia?  POA: With leukocytosis, tachycardia and lactic acidosis.  Pro-Cal negative.  No significant change on repeat CXR after diuresis.  Sepsis physiology resolved. -Continue ceftriaxone to complete 5 days course -Three days of azithromycin should be enough given the QTC.  CKD-3A: Relatively stable.             Recent Labs    04/02/20 1021 06/08/20 1445 07/12/20 1126 09/07/20 2134 09/08/20 0650 09/09/20 0849 10/15/20 1437 10/30/20 0939 10/31/20 0658 11/01/20 0532  BUN '19 11 23 13 14 13 17 18 18 18  '$ CREATININE 1.19* 1.04  1.15* 1.32* 1.16* 1.02* 1.03* 0.91 1.03* 1.01*  -Continue monitoring   Lactic acidosis: Resolved.   Hypothyroidism: TSH within normal. -Continue home Euthyrox   GERD: Continue home Nexium   Chronic hypokalemia: resolved.   Anxiety: Stable.   -Continue home Xanax   History of PE:  -Continue home Xarelto   Body mass index is 16.87 kg/m.           Discharge Exam: Vitals:   11/01/20 1755 11/01/20 1847 11/01/20 1855 11/01/20 1923  BP: (!) 110/94 (!) 152/112 (!) 144/90 (!) 144/90  Pulse: 97 99 98 98  Temp: 98.6 F (37 C) 97.9 F (36.6 C) 97.9 F (36.6 C) 97.9 F (36.6 C)  Resp: '15 20 18 18  '$ Height:      Weight:      SpO2: 98% 100% 97% 97%  TempSrc: Oral Oral Oral   BMI (Calculated):         See latest neuro exam by my colleague Dr. Roderic Palau and neurology Dr. Quinn Axe    Discharge Instructions  Discharge Instructions     Amb referral to [facility]   Complete by: As directed       Allergies as of 11/01/2020       Reactions   Statins    Severe myalgias        Medication List     STOP taking these medications    furosemide 40 MG tablet Commonly known as: LASIX       TAKE these medications    acetaminophen 500 MG tablet Commonly known as: TYLENOL Take 500 mg by mouth every 6 (six) hours as needed.   ALPRAZolam 0.25 MG tablet Commonly known as: XANAX Take 1 tablet (0.25 mg total) by mouth at bedtime as needed for anxiety or sleep.   amiodarone 200 MG tablet Commonly known as: PACERONE Take 200 mg by mouth daily.   benzonatate 200 MG capsule Commonly known as: TESSALON Take 1 capsule (200 mg total) by mouth 3 (three) times daily as needed for cough.   cefTRIAXone 2 g in sodium chloride 0.9 % 100 mL Inject 2 g into the vein daily for 2 days. Start taking on: November 02, 2020   chlorpheniramine-HYDROcodone 10-8 MG/5ML Suer Commonly known as: TUSSIONEX Take 5 mLs by mouth every 12 (twelve) hours as needed for cough.   diltiazem 30 MG  tablet Commonly known as: CARDIZEM Take 1 tablet (30 mg total) by mouth every 8 (eight) hours. What changed: when to take this   esomeprazole 40 MG capsule Commonly known as: NexIUM Take 1 capsule (40 mg total) by mouth daily.   ipratropium 0.02 % nebulizer solution Commonly known as: ATROVENT Take 2.5 mLs (0.5 mg total) by nebulization every 4 (four) hours as needed for wheezing or shortness of breath (cough).   levothyroxine 50 MCG tablet Commonly known as: Euthyrox TAKE 1 TABLET BY MOUTH ONCE DAILY 30  MINUTES  PRIOR  TO  EATING;   potassium chloride SA 20 MEQ tablet Commonly known as: KLOR-CON Take 20 mEq by mouth daily.   Xarelto 15 MG Tabs tablet Generic drug: Rivaroxaban TAKE 1 TABLET EVERY DAY  Consultations: Cardiology Neurology  Procedures/Studies: None   DG Chest 2 View  Result Date: 10/31/2020 CLINICAL DATA:  Shortness of breath, weakness EXAM: CHEST - 2 VIEW COMPARISON:  10/30/2020 FINDINGS: No significant change in chest radiographs with gross cardiomegaly and a small left pleural effusion with associated atelectasis or consolidation. No new airspace opacity. Disc degenerative disease of the thoracic spine. IMPRESSION: No significant change in chest radiographs with gross cardiomegaly and a small left pleural effusion with associated atelectasis or consolidation. No new airspace opacity. Electronically Signed   By: Eddie Candle M.D.   On: 10/31/2020 11:30   DG Chest 2 View  Result Date: 10/15/2020 CLINICAL DATA:  Atrial fibrillation EXAM: CHEST - 2 VIEW COMPARISON:  09/08/2020, chest CT 10/05/2020 FINDINGS: Mild interstitial prominence. Hazy density bilaterally could reflect the ground-glass opacities seen on prior CT. There is no pleural effusion or pneumothorax. Left atrial enlargement. No acute osseous abnormality. IMPRESSION: Mild interstitial prominence. Hazy density bilaterally could reflect ground-glass opacity seen on prior CT. May be  infectious/inflammatory. Left atrial enlargement. Electronically Signed   By: Macy Mis M.D.   On: 10/15/2020 15:20   CT Chest High Resolution  Result Date: 10/05/2020 CLINICAL DATA:  87 year old female with history of persistent wet cough. Evaluate for interstitial lung disease. EXAM: CT CHEST WITHOUT CONTRAST TECHNIQUE: Multidetector CT imaging of the chest was performed following the standard protocol without intravenous contrast. High resolution imaging of the lungs, as well as inspiratory and expiratory imaging, was performed. COMPARISON:  Chest CTA 04/02/2020. FINDINGS: Cardiovascular: Heart size is mildly enlarged with left atrial dilatation. There is no significant pericardial fluid, thickening or pericardial calcification. There is aortic atherosclerosis, as well as atherosclerosis of the great vessels of the mediastinum and the coronary arteries, including calcified atherosclerotic plaque in the left anterior descending and right coronary arteries. Mild calcifications of the aortic valve. Severe calcifications of the mitral valve and annulus. Mediastinum/Nodes: There are multiple prominent but non pathologically enlarged mediastinal and bilateral hilar lymph nodes, nonspecific. Esophagus is unremarkable in appearance. No axillary lymphadenopathy. Lungs/Pleura: High-resolution images demonstrates some patchy areas of ground-glass attenuation and septal thickening scattered throughout the lungs bilaterally. There is no discernible craniocaudal gradient. In addition, there are some more dense areas of apparent peribronchovascular ground-glass attenuation and nodular consolidation, most evident in the basal segments of the lower lobes of the lungs bilaterally with there are also some regional areas of architectural distortion and volume loss. Scattered areas of mild cylindrical bronchiectasis are noted. No honeycombing. Inspiratory and expiratory imaging demonstrates some mild air trapping indicative  of mild small airways disease. No pleural effusions. Upper Abdomen: Aortic atherosclerosis.  Status post cholecystectomy. Musculoskeletal: There are no aggressive appearing lytic or blastic lesions noted in the visualized portions of the skeleton. IMPRESSION: 1. The appearance of the lungs is most suggestive of an alternative diagnosis (not usual interstitial pneumonia) per current ATS guidelines. Findings are unusual and nonspecific, but favored to reflect an underlying infectious process. If there is persistent clinical concern for interstitial lung disease, repeat high-resolution chest CT should be considered in 6-12 months to assess for temporal changes in the appearance of the lung parenchyma. 2. Cardiomegaly with left atrial dilatation. 3. Aortic atherosclerosis, in addition to 2 vessel coronary artery disease. 4. There are calcifications of the aortic valve and mitral valve/annulus. Echocardiographic correlation for evaluation of potential valvular dysfunction may be warranted if clinically indicated. Aortic Atherosclerosis (ICD10-I70.0). Electronically Signed   By: Vinnie Langton M.D.   On: 10/05/2020 12:06  DG Chest Port 1 View  Result Date: 10/30/2020 CLINICAL DATA:  Shortness of breath and weakness. EXAM: PORTABLE CHEST 1 VIEW COMPARISON:  10/15/2020 FINDINGS: Numerous leads and wires project over the chest. Patient rotated minimally left. Midline trachea. Mild cardiomegaly. Small left pleural effusion. No pneumothorax. Interstitial prominence and indistinctness is increased. Left greater than right base airspace disease. IMPRESSION: Cardiomegaly with mild congestive heart failure. Small left pleural effusion with bibasilar airspace disease. This could represent atelectasis or, especially at the left lung base, infection. Electronically Signed   By: Abigail Miyamoto M.D.   On: 10/30/2020 10:12   CT ANGIO HEAD NECK W WO CM (CODE STROKE)  Result Date: 11/01/2020 CLINICAL DATA:  Neuro deficit, acute,  stroke suspected. Clinical concern for left MCA infarct. EXAM: CT ANGIOGRAPHY HEAD AND NECK TECHNIQUE: Multidetector CT imaging of the head and neck was performed using the standard protocol during bolus administration of intravenous contrast. Multiplanar CT image reconstructions and MIPs were obtained to evaluate the vascular anatomy. Carotid stenosis measurements (when applicable) are obtained utilizing NASCET criteria, using the distal internal carotid diameter as the denominator. CONTRAST:  23m OMNIPAQUE IOHEXOL 350 MG/ML SOLN COMPARISON:  Head CT 03/09/2020. High-resolution chest CT 10/05/2020. FINDINGS: CT HEAD FINDINGS Brain: There is no evidence of an acute infarct, intracranial hemorrhage, mass, midline shift, or extra-axial fluid collection. Mild cerebral atrophy is within normal limits for age. Vascular: Calcified atherosclerosis at the skull base. Hyperdense left ICA terminus/proximal left MCA. Skull: No fracture or suspicious osseous lesion. Sinuses: Clear paranasal sinuses. Persistent moderate right mastoid effusion. Orbits: Bilateral cataract extraction. Review of the MIP images confirms the above findings CTA NECK FINDINGS Aortic arch: Standard 3 vessel aortic arch with mild atherosclerotic plaque. Widely patent arch vessel origins. Right carotid system: Patent with minimal calcified plaque at the carotid bifurcation. No evidence of a significant stenosis or dissection. Beading of the mid to distal cervical ICA. Left carotid system: The common carotid artery and bifurcation are widely patent and there is a normal appearance of the proximal most ICA, however there is progressively diminishing enhancement of the mid and distal portions of the cervical ICA with only faint enhancement at the skull base. A discrete dissection flap is not visible. Vertebral arteries: Patent and codominant without evidence of stenosis or dissection. Skeleton: Focally advanced cervical disc degeneration at C5-6 where there  is grade 1 anterolisthesis. Other neck: No evidence of cervical lymphadenopathy or mass. Upper chest: Small bilateral pleural effusions. Bilateral ground-glass opacities with interlobular septal thickening, increased from the prior chest CT. Borderline enlarged mediastinal lymph nodes, potentially reactive. Review of the MIP images confirms the above findings CTA HEAD FINDINGS Anterior circulation: There is progressively diminishing faint opacification of the petrous and cavernous segments of the left ICA with absent opacification from the level of the anterior genu through the terminus. The left M1 segment is occluded proximally with reconstitution at the level of the MCA bifurcation although reconstituted MCA branches are mildly attenuated diffusely. The left A1 segment reconstitutes just beyond its origin. The right MCA and right ACA are patent without evidence of a significant proximal stenosis. No aneurysm is identified. Posterior circulation: Intracranial vertebral arteries are patent to the basilar. Patent PICA, AICA, and SCA origins are visualized bilaterally. The basilar artery is widely patent. Posterior communicating arteries are diminutive or absent. The PCAs are patent with mild multifocal P2 stenoses more notable on the left than the right. No aneurysm is identified. Venous sinuses: Patent. Anatomic variants: None. Review of the  MIP images confirms the above findings IMPRESSION: 1. Occlusion of the intracranial left ICA including the ICA terminus and left MCA and ACA origins. Reconstitution of the left MCA and ACA as above. 2. No visible acute infarct or intracranial hemorrhage. ASPECTS of 10. 3. Small pleural effusions and partially visualized bilateral lung opacities, nonspecific but could reflect edema or infection. 4.  Aortic Atherosclerosis (ICD10-I70.0). These results were called by telephone at the time of interpretation on 11/01/2020 at 6:40 pm to Dr. Su Monks, who verbally acknowledged  these results. Electronically Signed   By: Logan Bores M.D.   On: 11/01/2020 19:01       The results of significant diagnostics from this hospitalization (including imaging, microbiology, ancillary and laboratory) are listed below for reference.     Microbiology: Recent Results (from the past 240 hour(s))  SARS CORONAVIRUS 2 (TAT 6-24 HRS) Nasopharyngeal Nasopharyngeal Swab     Status: None   Collection Time: 10/30/20  9:29 AM   Specimen: Nasopharyngeal Swab  Result Value Ref Range Status   SARS Coronavirus 2 NEGATIVE NEGATIVE Final    Comment: (NOTE) SARS-CoV-2 target nucleic acids are NOT DETECTED.  The SARS-CoV-2 RNA is generally detectable in upper and lower respiratory specimens during the acute phase of infection. Negative results do not preclude SARS-CoV-2 infection, do not rule out co-infections with other pathogens, and should not be used as the sole basis for treatment or other patient management decisions. Negative results must be combined with clinical observations, patient history, and epidemiological information. The expected result is Negative.  Fact Sheet for Patients: SugarRoll.be  Fact Sheet for Healthcare Providers: https://www.woods-mathews.com/  This test is not yet approved or cleared by the [place] FDA and  has been authorized for detection and/or diagnosis of SARS-CoV-2 by FDA under an Emergency Use Authorization (EUA). This EUA will remain  in effect (meaning this test can be used) for the duration of the COVID-19 declaration under Se ction 564(b)(1) of the Act, 21 U.S.C. section 360bbb-3(b)(1), unless the authorization is terminated or revoked sooner.  Performed at [facility], [place] [address] [place], [place] [address]   Blood culture (routine x 2)     Status: None (Preliminary result)   Collection Time: 10/30/20 10:35 AM   Specimen: BLOOD  Result Value Ref Range Status   Specimen  Description BLOOD BLOOD LEFT ARM  Final   Special Requests   Final    BOTTLES DRAWN AEROBIC AND ANAEROBIC Blood Culture adequate volume   Culture   Final    NO GROWTH 2 DAYS Performed at [facility], [address], [name], [place] [address]    Report Status PENDING  Incomplete  Blood culture (routine x 2)     Status: None (Preliminary result)   Collection Time: 10/30/20 10:35 AM   Specimen: BLOOD  Result Value Ref Range Status   Specimen Description BLOOD BLOOD LEFT HAND  Final   Special Requests   Final    BOTTLES DRAWN AEROBIC ONLY Blood Culture adequate volume   Culture   Final    NO GROWTH 2 DAYS Performed at [facility], [address] [place], [place] [address]    Report Status PENDING  Incomplete     Labs:  CBC: Recent Labs  Lab 10/30/20 0926 10/31/20 0658 11/01/20 0532  WBC 10.6* 8.3 7.8  HGB 11.4* 10.8* 10.3*  HCT 36.0 33.4* 32.4*  MCV 87.6 87.7 88.8  PLT 507* 434* 396   BMP &GFR Recent Labs  Lab 10/30/20 0939 10/31/20 0658 11/01/20 0532  NA 138 137 138  K 4.2 3.6 4.7  CL 106 102 105  CO2 20* 28 28  GLUCOSE 120* 99 103*  BUN '18 18 18  '$ CREATININE 0.91 1.03* 1.01*  CALCIUM 8.4* 8.3* 8.4*  MG  --  2.1 2.1  PHOS  --   --  2.6   Estimated Creatinine Clearance: 26.3 mL/min (A) (by C-G formula based on SCr of 1.01 mg/dL (H)). Liver & Pancreas: Recent Labs  Lab 10/30/20 0939 11/01/20 0532  AST 24  --   ALT 15  --   ALKPHOS 89  --   BILITOT 1.8*  --   PROT 6.8  --   ALBUMIN 3.3* 2.6*   No results for input(s): LIPASE, AMYLASE in the last 168 hours. No results for input(s): AMMONIA in the last 168 hours. Diabetic: No results for input(s): HGBA1C in the last 72 hours. No results for input(s): GLUCAP in the last 168 hours. Cardiac Enzymes: No results for input(s): CKTOTAL, CKMB, CKMBINDEX, TROPONINI in the last 168 hours. No results for input(s): PROBNP in the last 8760 hours. Coagulation Profile: No results for  input(s): INR, PROTIME in the last 168 hours. Thyroid Function Tests: Recent Labs    10/30/20 0939  TSH 2.300   Lipid Profile: No results for input(s): CHOL, HDL, LDLCALC, TRIG, CHOLHDL, LDLDIRECT in the last 72 hours. Anemia Panel: No results for input(s): VITAMINB12, FOLATE, FERRITIN, TIBC, IRON, RETICCTPCT in the last 72 hours. Urine analysis:    Component Value Date/Time   COLORURINE YELLOW (A) 09/08/2020 0105   APPEARANCEUR HAZY (A) 09/08/2020 0105   APPEARANCEUR Clear 02/05/2013 1630   LABSPEC 1.024 09/08/2020 0105   LABSPEC 1.009 02/05/2013 1630   PHURINE 6.0 09/08/2020 0105   GLUCOSEU NEGATIVE 09/08/2020 0105   GLUCOSEU NEGATIVE 04/04/2018 1149   HGBUR NEGATIVE 09/08/2020 0105   BILIRUBINUR NEGATIVE 09/08/2020 0105   BILIRUBINUR 1 08/31/2017 1157   BILIRUBINUR Negative 02/05/2013 1630   KETONESUR 5 (A) 09/08/2020 0105   PROTEINUR 100 (A) 09/08/2020 0105   UROBILINOGEN 1.0 04/04/2018 1149   NITRITE NEGATIVE 09/08/2020 0105   LEUKOCYTESUR NEGATIVE 09/08/2020 0105   LEUKOCYTESUR 1+ 02/05/2013 1630   Sepsis Labs: Invalid input(s): PROCALCITONIN, LACTICIDVEN   Time coordinating discharge: 35 minutes  SIGNED:  Mercy Riding, MD  [facility] 11/01/2020, 7:32 PM

## 2020-11-01 NOTE — Progress Notes (Signed)
Patient being transferred to [facility] in [place] for embolectomy for acute stroke. Daughters at bedside and consent to her transfer. See flowsheet for vitals. Report given to [facility].

## 2020-11-01 NOTE — Progress Notes (Signed)
PROGRESS NOTE  Frances Kent [id number] DOB: [birth year]   PCP: [name], MD  Patient is from: Home.  Lives with her great granddaughter.  Independent for ADL's and IADL's.  DOA: 10/30/2020 LOS: 2  Chief complaints:  Chief Complaint  Patient presents with   Weakness   Shortness of Breath     Brief Narrative / Interim history: 87 year old F with PMH of A. fib/SVT on Xarelto, PE, HTN, hypothyroidism, anxiety and GERD presenting with worsening shortness of breath, palpitation, fatigue and edema for 1 day, and admitted for A. fib with RVR, acute on chronic systolic CHF and possible LLL pneumonia.  BNP elevated to 1000 (baseline of 900).  CXR raises concern for LLL pneumonia or CHF.  Started on Cardizem drip, IV Lasix, CTX and azithromycin.  Cardiology consulted.  Patient improved quickly with diuretics and control of a heart rate.  Transitioned to p.o. TTE pending.  Subjective: Seen and examined earlier this morning.  No major events overnight.  Just weak.  Denies shortness of breath, chest pain, palpitation or dizziness.  However, she became hypotensive and clammy when she was assisted to commode later in the morning.  She had Cardizem earlier in the morning.  She received IV Lasix later, and also started on Toprol-XL.  EKG without acute finding.  Troponin negative.  Blood pressure improved later on.  Objective: Vitals:   11/01/20 0737 11/01/20 1202 11/01/20 1222 11/01/20 1300  BP: 100/70 (!) 88/74 (!) 82/60 102/70  Pulse: 92 92    Resp: 16 16    Temp: 98.1 F (36.7 C) 97.6 F (36.4 C)    TempSrc:      SpO2: 93% 100%    Weight:      Height:        Intake/Output Summary (Last 24 hours) at 11/01/2020 1520 Last data filed at 11/01/2020 1400 Gross per 24 hour  Intake 360 ml  Output 400 ml  Net -40 ml   Filed Weights   10/30/20 0924 10/31/20 1551 11/01/20 0500  Weight: 44.9 kg 43.2 kg 43.2 kg    Examination:  GENERAL: No apparent distress.  Nontoxic. HEENT:  MMM.  Vision and hearing grossly intact.  NECK: Supple.  No apparent JVD.  RESP: 97% on RA.  No IWOB.  Fair aeration bilaterally.  Mild rhonchi bilaterally. CVS: Irregular rhythm.  HR in 90s.  Heart sounds normal.  ABD/GI/GU: BS+. Abd soft, NTND.  MSK/EXT:  Moves extremities. No apparent deformity. No edema.  SKIN: no apparent skin lesion or wound NEURO: Awake and alert. Oriented appropriately.  No apparent focal neuro deficit. PSYCH: Calm. Normal affect.  Procedures:  None  Microbiology summarized: [id number] and influenza PCR nonreactive. Blood cultures NGTD.  Assessment & Plan: Acute on chronic systolic CHF: TTE in [id number] with LVEF of 40 to [id number], normal diastolic function, mod MV stenosis.  Unclear if CHF provoked A. fib or vice versa.  BNP elevated to 1100.  CXR with consistent with CHF.  Symptoms and BNP improved with IV Lasix.  I&O incomplete.  Weight down. -Received IV Lasix 40 mg early in the morning -Can be transitioned to p.o. Lasix in the morning -Follow echocardiogram -GDMT-started Toprol-XL 25 mg daily.  Stop Cardizem -Sodium and fluid restriction -Monitor fluid status, renal functions and electrolytes.  Permanent A. fib with RVR: RVR resolved.  CHA2DS2-VASc score 6.  On amiodarone, Cardizem and Xarelto.  Since she was prescribed Cardizem 30 mg 3 times daily but has been taking twice daily.  She is  on Xarelto for anticoagulation.  TSH within normal. -Add Toprol-XL 25 mg daily-given systolic CHF. -Changed Cardizem to as needed for sustained HR> 120 if BP allows -Continue home amiodarone. -Continue Xarelto for anticoagulation  Hypotension/near syncope: Likely iatrogenic.  Troponin negative.  EKG without acute ischemic finding but prolonged QT.  -Adjusted diuretics and cardiac meds as above -Follow TTE  Prolonged QT: QTc prolonged to 588 but exaggerated by wide QRS and tachycardia.  K4.7.  Mg 2.1. -Continue telemetry monitoring -Discontinue azithromycin and as needed  Zofran -Optimize K and Mg -Avoid QT prolonging drugs -Toprol as above.  Severe sepsis due to community-acquired pneumonia?  POA: With leukocytosis, tachycardia and lactic acidosis.  Pro-Cal negative.  No significant change on repeat CXR after diuresis.  Sepsis physiology resolved. -Continue ceftriaxone to complete 5 days course -Three days of azithromycin should be enough given the QTC.  CKD-3A: Relatively stable. Recent Labs    04/02/20 1021 06/08/20 1445 07/12/20 1126 09/07/20 2134 09/08/20 0650 09/09/20 0849 10/15/20 1437 10/30/20 0939 10/31/20 0658 11/01/20 0532  BUN '19 11 23 13 14 13 17 18 18 18  '$ CREATININE 1.19* 1.04 1.15* 1.32* 1.16* 1.02* 1.03* 0.91 1.03* 1.01*  -Continue monitoring  Lactic acidosis: Resolved.   Hypothyroidism: TSH within normal. -Continue home Euthyrox   GERD: Continue home Nexium   Chronic hypokalemia: resolved.   Anxiety: Stable.   -Continue home Xanax   History of PE:  -Continue home Xarelto    Body mass index is 16.87 kg/m.         DVT prophylaxis:   Rivaroxaban (XARELTO) tablet 15 mg  Code Status: Full code Family Communication: Patient and patient's RN.  No family but at bedside today.  Level of care: Progressive Cardiac Status is: Inpatient  Remains inpatient appropriate because:Ongoing diagnostic testing needed not appropriate for outpatient work up, IV treatments appropriate due to intensity of illness or inability to take PO, and Inpatient level of care appropriate due to severity of illness  Dispo: The patient is from: Home              Anticipated d/c is to: Home              Patient currently is not medically stable to d/c.   Difficult to place patient No       Consultants:  Cardiology   Sch Meds:  Scheduled Meds:  amiodarone  200 mg Oral Daily   levothyroxine  50 mcg Oral Q0600   melatonin  5 mg Oral QHS   metoprolol succinate  25 mg Oral Daily   pantoprazole  40 mg Oral Daily   Rivaroxaban  15 mg  Oral Daily   Continuous Infusions:  cefTRIAXone (ROCEPHIN)  IV 2 g (11/01/20 1022)   PRN Meds:.acetaminophen **OR** acetaminophen, ALPRAZolam, benzonatate, chlorpheniramine-HYDROcodone, HYDROcodone-acetaminophen, ipratropium, morphine injection, polyethylene glycol  Antimicrobials: Anti-infectives (From admission, onward)    Start     Dose/Rate Route Frequency Ordered Stop   10/30/20 1030  cefTRIAXone (ROCEPHIN) 2 g in sodium chloride 0.9 % 100 mL IVPB        2 g 200 mL/hr over 30 Minutes Intravenous Every 24 hours 10/30/20 1021 11/04/20 1029   10/30/20 1030  azithromycin (ZITHROMAX) 500 mg in sodium chloride 0.9 % 250 mL IVPB  Status:  Discontinued        500 mg 250 mL/hr over 60 Minutes Intravenous Every 24 hours 10/30/20 1021 11/01/20 1520        I have personally reviewed the following labs and  images: CBC: Recent Labs  Lab 10/30/20 0926 10/31/20 0658 11/01/20 0532  WBC 10.6* 8.3 7.8  HGB 11.4* 10.8* 10.3*  HCT 36.0 33.4* 32.4*  MCV 87.6 87.7 88.8  PLT 507* 434* 396   BMP &GFR Recent Labs  Lab 10/30/20 0939 10/31/20 0658 11/01/20 0532  NA 138 137 138  K 4.2 3.6 4.7  CL 106 102 105  CO2 20* 28 28  GLUCOSE 120* 99 103*  BUN '18 18 18  '$ CREATININE 0.91 1.03* 1.01*  CALCIUM 8.4* 8.3* 8.4*  MG  --  2.1 2.1  PHOS  --   --  2.6   Estimated Creatinine Clearance: 26.3 mL/min (A) (by C-G formula based on SCr of 1.01 mg/dL (H)). Liver & Pancreas: Recent Labs  Lab 10/30/20 0939 11/01/20 0532  AST 24  --   ALT 15  --   ALKPHOS 89  --   BILITOT 1.8*  --   PROT 6.8  --   ALBUMIN 3.3* 2.6*   No results for input(s): LIPASE, AMYLASE in the last 168 hours. No results for input(s): AMMONIA in the last 168 hours. Diabetic: No results for input(s): HGBA1C in the last 72 hours. No results for input(s): GLUCAP in the last 168 hours. Cardiac Enzymes: No results for input(s): CKTOTAL, CKMB, CKMBINDEX, TROPONINI in the last 168 hours. No results for input(s): PROBNP in  the last 8760 hours. Coagulation Profile: No results for input(s): INR, PROTIME in the last 168 hours. Thyroid Function Tests: Recent Labs    10/30/20 0939  TSH 2.300   Lipid Profile: No results for input(s): CHOL, HDL, LDLCALC, TRIG, CHOLHDL, LDLDIRECT in the last 72 hours. Anemia Panel: No results for input(s): VITAMINB12, FOLATE, FERRITIN, TIBC, IRON, RETICCTPCT in the last 72 hours. Urine analysis:    Component Value Date/Time   COLORURINE YELLOW (A) 09/08/2020 0105   APPEARANCEUR HAZY (A) 09/08/2020 0105   APPEARANCEUR Clear 02/05/2013 1630   LABSPEC 1.024 09/08/2020 0105   LABSPEC 1.009 02/05/2013 1630   PHURINE 6.0 09/08/2020 0105   GLUCOSEU NEGATIVE 09/08/2020 0105   GLUCOSEU NEGATIVE 04/04/2018 1149   HGBUR NEGATIVE 09/08/2020 0105   BILIRUBINUR NEGATIVE 09/08/2020 0105   BILIRUBINUR 1 08/31/2017 1157   BILIRUBINUR Negative 02/05/2013 1630   KETONESUR 5 (A) 09/08/2020 0105   PROTEINUR 100 (A) 09/08/2020 0105   UROBILINOGEN 1.0 04/04/2018 1149   NITRITE NEGATIVE 09/08/2020 0105   LEUKOCYTESUR NEGATIVE 09/08/2020 0105   LEUKOCYTESUR 1+ 02/05/2013 1630   Sepsis Labs: Invalid input(s): PROCALCITONIN, Cecilia  Microbiology: Recent Results (from the past 240 hour(s))  SARS CORONAVIRUS 2 (TAT 6-24 HRS) Nasopharyngeal Nasopharyngeal Swab     Status: None   Collection Time: 10/30/20  9:29 AM   Specimen: Nasopharyngeal Swab  Result Value Ref Range Status   SARS Coronavirus 2 NEGATIVE NEGATIVE Final    Comment: (NOTE) SARS-CoV-2 target nucleic acids are NOT DETECTED.  The SARS-CoV-2 RNA is generally detectable in upper and lower respiratory specimens during the acute phase of infection. Negative results do not preclude SARS-CoV-2 infection, do not rule out co-infections with other pathogens, and should not be used as the sole basis for treatment or other patient management decisions. Negative results must be combined with clinical observations, patient history,  and epidemiological information. The expected result is Negative.  Fact Sheet for Patients: SugarRoll.be  Fact Sheet for Healthcare Providers: https://www.woods-mathews.com/  This test is not yet approved or cleared by the [place] FDA and  has been authorized for detection and/or diagnosis of  SARS-CoV-2 by FDA under an Emergency Use Authorization (EUA). This EUA will remain  in effect (meaning this test can be used) for the duration of the COVID-19 declaration under Se ction 564(b)(1) of the Act, 21 U.S.C. section 360bbb-3(b)(1), unless the authorization is terminated or revoked sooner.  Performed at [facility], [place] [address] [place], [place] [address]   Blood culture (routine x 2)     Status: None (Preliminary result)   Collection Time: 10/30/20 10:35 AM   Specimen: BLOOD  Result Value Ref Range Status   Specimen Description BLOOD BLOOD LEFT ARM  Final   Special Requests   Final    BOTTLES DRAWN AEROBIC AND ANAEROBIC Blood Culture adequate volume   Culture   Final    NO GROWTH 2 DAYS Performed at [facility], [address] [place], [place] [address]    Report Status PENDING  Incomplete  Blood culture (routine x 2)     Status: None (Preliminary result)   Collection Time: 10/30/20 10:35 AM   Specimen: BLOOD  Result Value Ref Range Status   Specimen Description BLOOD BLOOD LEFT HAND  Final   Special Requests   Final    BOTTLES DRAWN AEROBIC ONLY Blood Culture adequate volume   Culture   Final    NO GROWTH 2 DAYS Performed at [facility], [address] [place], [place] [address]    Report Status PENDING  Incomplete    Radiology Studies: No results found.    Domenique Southers T. [place]  If 7PM-7AM, please contact night-coverage [email or web address] 11/01/2020, 3:20 PM

## 2020-11-01 NOTE — Transfer of Care (Signed)
Immediate Anesthesia Transfer of Care Note  Patient: Frances Kent  Procedure(s) Performed: RADIOLOGY WITH ANESTHESIA  Patient Location: ICU  Anesthesia Type:General  Level of Consciousness: sedated, unresponsive and Patient remains intubated per anesthesia plan  Airway & Oxygen Therapy: Patient remains intubated per anesthesia plan and Patient placed on Ventilator (see vital sign flow sheet for setting)  Post-op Assessment: Report given to RN and Post -op Vital signs reviewed and stable  Post vital signs: Reviewed and stable  Last Vitals:  Vitals Value Taken Time  BP 143/77 11/01/20 2141  Temp    Pulse 89 11/01/20 2149  Resp 15 11/01/20 2149  SpO2 100 % 11/01/20 2149  Vitals shown include unvalidated device data.  Last Pain: There were no vitals filed for this visit.       Complications: No notable events documented.

## 2020-11-01 NOTE — Progress Notes (Signed)
eLink Physician-Brief Progress Note Patient Name: MIKEYA CELONA DOB: 09/01/1930 MRN: [id number]   Date of Service  11/01/2020  HPI/Events of Note  Brief HPI: 87 yo female admitted with hx HTN and a fib on xarelto, inpt for treatment of a-fib w/ RVR, acute on chronic CHF exacerbation and possible LLL PNA. S/p IR thrombectomy , on Vent.  Camera: In synch with vent 420( VT < 8 ml.ibw)/5/40%. HR 93, 100% sats, art line MAP 140 SBP.  Data: 7.41/37/337. Hg 10.2 BNP 418, pro cal < 0.10 CBG < 180.   eICU Interventions  - follow IR/Neuro recommendations - goal SBP 120 to 140. -CBG < 180 - lung protective ventilation - SCD/SUP     Intervention Category Major Interventions: Respiratory failure - evaluation and management;Other:;Arrhythmia - evaluation and management Evaluation Type: New Patient Evaluation  Elmer Sow 11/01/2020, 10:18 PM

## 2020-11-01 NOTE — Anesthesia Procedure Notes (Addendum)
Procedure Name: Intubation Date/Time: 11/01/2020 8:20 PM Performed by: Suzy Bouchard, CRNA Pre-anesthesia Checklist: Patient identified, Emergency Drugs available, Suction available and Patient being monitored Patient Re-evaluated:Patient Re-evaluated prior to induction Oxygen Delivery Method: Circle system utilized Preoxygenation: Pre-oxygenation with 100% oxygen Induction Type: IV induction, Rapid sequence and Cricoid Pressure applied Laryngoscope Size: Miller and 2 Grade View: Grade I Tube type: Oral Tube size: 7.5 mm Number of attempts: 1 Airway Equipment and Method: Stylet and Oral airway Placement Confirmation: ETT inserted through vocal cords under direct vision, positive ETCO2 and breath sounds checked- equal and bilateral Secured at: 21 cm Tube secured with: Tape Dental Injury: Teeth and Oropharynx as per pre-operative assessment

## 2020-11-01 NOTE — Progress Notes (Signed)
Assisted with transfer to IR.

## 2020-11-01 NOTE — Consult Note (Signed)
NEUROLOGY TELECONSULTATION NOTE   Date of service: November 01, 2020 Patient Name: Frances Kent MRN:  [id number] DOB:  January 01, 1931 Reason for consult: telestroke -> code IR L ICA and L M1 occlusions  Requesting Provider: Dr. Wendee Beavers MD Consult Participants: myself, patient, bedside RN, telestroke RN, daughter [name]  Location of the provider: [facility] Location of the patient: [facility]  This consult was provided via telemedicine with 2-way video and audio communication. The patient/family was informed that care would be provided in this way and agreed to receive care in this manner.   _ _ _   _ __   _ __ _ _  __ __   _ __   __ _  History of Present Illness   Patient with hx HTN and a fib on xarelto admitted for a fib w/ RVR, acute on chronic CHF exacerbation and possible LLL PNA. Inpatient stroke code was called for acute onset global aphasia and R hemiplegia. LKW 1800, RN was in the room when she developed the new neurologic deficits. Patient is on xarelto last dose today therefore she was not a TNK candidate. Head CT wo showed NAICP, no ICH, ASPECTS 10. CTA H&N showed L ICA occlusion with reconstitution and a L M1 cutoff.   Consent for IR obtained from daughter [name] ([phone]) over video conference. I explained that patient is having a large stroke from a blockage in an important artery going to her brain. Because she is on xarelto she is not a candidate for TNK. I explained the risks and benefits of IR thrombectomy for patient's LVO (L ICA and L M1) including a 10% risk of ICH. [name] gave informed consent to proceed.   Per daughter, patient is highly functional at baseline, no significant cognitive or neurologic deficits, independent in all activities.  Discussed with Dr. Earleen Newport of neuro IR and Dr. Curly Shores from [facility] neurology. Code IR activated and transport to [facility] arranged through [place].    ROS   UTA 2/2 aphasia  Past History   Past Medical History:   Diagnosis Date  . Atrial fibrillation ([place])   . Benign breast cyst in female, left 10/07/2016  . Colon adenomas   . GERD (gastroesophageal reflux disease)   . Hypertension   . Hypothyroidism   . Macular degeneration   . Mitral regurgitation   . Pulmonary embolism ([place]) 02/2016  . SVT (supraventricular tachycardia) ([facility])    Past Surgical History:  Procedure Laterality Date  . APPENDECTOMY  1960  . BREAST CYST ASPIRATION Left 2017  . CARDIOVERSION N/A 04/05/2016   Procedure: Cardioversion;  Surgeon: [name], MD;  Location: [facility] ORS;  Service: Cardiovascular;  Laterality: N/A;  . CHOLECYSTECTOMY  1985  . ESOPHAGOGASTRODUODENOSCOPY (EGD) WITH PROPOFOL N/A 11/27/2019   Procedure: ESOPHAGOGASTRODUODENOSCOPY (EGD) WITH PROPOFOL;  Surgeon: Lesly Rubenstein, MD;  Location: [facility] ENDOSCOPY;  Service: Endoscopy;  Laterality: N/A;  . ESOPHAGOGASTRODUODENOSCOPY (EGD) WITH PROPOFOL N/A 12/24/2019   Procedure: ESOPHAGOGASTRODUODENOSCOPY (EGD) WITH PROPOFOL;  Surgeon: Lesly Rubenstein, MD;  Location: [facility] ENDOSCOPY;  Service: Endoscopy;  Laterality: N/A;  . OVARIAN CYST REMOVAL    . TEE WITHOUT CARDIOVERSION N/A 02/01/2018   Procedure: TRANSESOPHAGEAL ECHOCARDIOGRAM (TEE);  Surgeon: [name], MD;  Location: [facility] ORS;  Service: Cardiovascular;  Laterality: N/A;   Family History  Problem Relation Age of Onset  . Cancer Mother 79       breast cancer, lived to 53,   . Breast cancer Mother 76  .  Cancer Son 72       pancreatic cancer  . Heart disease Son        CAD, Tobacco Abuse   . Heart disease Maternal Grandmother 65       died of massive MI  . Stroke Sister    Social History   Socioeconomic History  . Marital status: Widowed    Spouse name: Not on file  . Number of children: Not on file  . Years of education: Not on file  . Highest education level: Not on file  Occupational History  . Not on file  Tobacco Use  . Smoking status: Never  . Smokeless tobacco:  Never  . Tobacco comments:    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 , worked at [employer], Health visitor  . Vaping Use: Never used  Substance and Sexual Activity  . Alcohol use: No  . Drug use: No  . Sexual activity: Not Currently  Other Topics Concern  . Not on file  Social History Narrative   Has caretaking responsibility for 3 young grandchildren who live with her   Social Determinants of Health   Financial Resource Strain: Not on file  Food Insecurity: Not on file  Transportation Needs: Not on file  Physical Activity: Not on file  Stress: Not on file  Social Connections: Not on file   Allergies  Allergen Reactions  . Statins     Severe myalgias    Medications   Medications Prior to Admission  Medication Sig Dispense Refill Last Dose  . acetaminophen (TYLENOL) 500 MG tablet Take 500 mg by mouth every 6 (six) hours as needed.   prn at prn  . ALPRAZolam (XANAX) 0.25 MG tablet Take 1 tablet (0.25 mg total) by mouth at bedtime as needed for anxiety or sleep. 20 tablet 3 prn at prn  . amiodarone (PACERONE) 200 MG tablet Take 200 mg by mouth daily.   10/29/2020 at am  . benzonatate (TESSALON) 200 MG capsule Take 1 capsule (200 mg total) by mouth 3 (three) times daily as needed for cough. 60 capsule 1 prn at prn  . chlorpheniramine-HYDROcodone (TUSSIONEX) 10-8 MG/5ML SUER Take 5 mLs by mouth every 12 (twelve) hours as needed for cough. 140 mL 0 prn at prn  . diltiazem (CARDIZEM) 30 MG tablet Take 1 tablet (30 mg total) by mouth every 8 (eight) hours. (Patient taking differently: Take 30 mg by mouth 2 (two) times daily.) 90 tablet 0 10/29/2020 at pm  . esomeprazole (NEXIUM) 40 MG capsule Take 1 capsule (40 mg total) by mouth daily. 30 capsule 3 10/29/2020 at am  . furosemide (LASIX) 40 MG tablet Take 40 mg by mouth daily as needed for fluid.   prn at prn  . ipratropium (ATROVENT) 0.02 % nebulizer solution Take 2.5 mLs (0.5 mg total) by nebulization every 4 (four) hours as needed for wheezing or  shortness of breath (cough). 75 mL 0 prn at prn  . levothyroxine (EUTHYROX) 50 MCG tablet TAKE 1 TABLET BY MOUTH ONCE DAILY 30  MINUTES  PRIOR  TO  EATING; 90 tablet 0 10/29/2020 at am  . potassium chloride SA (KLOR-CON) 20 MEQ tablet Take 20 mEq by mouth daily.   prn at prn  . XARELTO 15 MG TABS tablet TAKE 1 TABLET EVERY DAY 60 tablet 1 10/29/2020 at am     Vitals   Vitals:   11/01/20 1300 11/01/20 1755 11/01/20 1847 11/01/20 1855  BP: 102/70 (!) 110/94 (!) 152/112 (!) 144/90  Pulse:  97 99 98  Resp:  _0 Temp:  98.6 F (37 C) 97.9 F (36.6 C) 97.9 F (36.6 C)  TempSrc:  Oral Oral Oral  SpO2:  98% 100% 97%  Weight:      Height:         Body mass index is 16.87 kg/m.  Physical Exam   Exam performed over telemedicine with 2-way video and audio communication and with assistance of bedside RN  Physical Exam Gen: arousable to noxious stimuli, unable to answer orientation questions 2/2 aphasia Resp: normal WOB CV: extremities appear well-perfused  Neuro: *MS: arousable to noxious stimuli, unable to answer orientation questions 2/2 aphasia *Speech: mute / severe global aphasia *CN: PERRL 41m, blinks to threat bilat, L gaze deviation, R UMN facial droop *Motor:  drift but not to bed in LUE, minimal movement against gravity in RUE and BLE *Sensory: SILT.  *Coordination:  UTA *Reflexes:  UTA 2/2 tele-exam *Gait: deferred  NIHSS  1a Level of Conscious.: 2 1b LOC Questions: 2 1c LOC Commands: 2 2 Best Gaze: 1 3 Visual: 0 4 Facial Palsy: 1 5a Motor Arm - left: 1 5b Motor Arm - Right: 3 6a Motor Leg - Left: 3 6b Motor Leg - Right: 3 7 Limb Ataxia: 0 8 Sensory: 0 9 Best Language: 2 10 Dysarthria: 0 11 Extinct. and Inatten.: 0  TOTAL: 20   Premorbid mRS = 1   Labs   CBC:  Recent Labs  Lab 10/31/20 0658 11/01/20 0532  WBC 8.3 7.8  HGB 10.8* 10.3*  HCT 33.4* 32.4*  MCV 87.7 88.8  PLT 434* 3371   Basic Metabolic Panel:  Lab Results  Component  Value Date   NA 138 11/01/2020   K 4.7 11/01/2020   CO2 28 11/01/2020   GLUCOSE 103 (H) 11/01/2020   BUN 18 11/01/2020   CREATININE 1.01 (H) 11/01/2020   CALCIUM 8.4 (L) 11/01/2020   GFRNONAA 54 (L) 11/01/2020   GFRAA 51 (L) 08/17/2018   Lipid Panel:  Lab Results  Component Value Date   LDLCALC 166 (H) 12/11/2019   HgbA1c:  Lab Results  Component Value Date   HGBA1C 6.1 (H) 10/16/2020   Urine Drug Screen: No results found for: LABOPIA, COCAINSCRNUR, LABBENZ, AMPHETMU, THCU, LABBARB  Alcohol Level [facility] results [facility]: [facility]  Impression   Patient with hx HTN and a fib on xarelto admitted for a fib w/ RVR, acute on chronic CHF exacerbation and possible LLL PNA. Inpatient stroke code was called for acute onset global aphasia and R hemiplegia. LKW 1800. CTH NAICP, ASPECTS 10. Not a TNK candidate 2/2 on xarelto. CTA showed L ICA occlusion with reconstitution and L M1 cutoff.   Consent for IR obtained from daughter PJacqulyn Bath(([phone] over video conference. I explained that patient is having a large stroke from a blockage in an important artery going to her brain. Because she is on xarelto she is not a candidate for TNK. I explained the risks and benefits of IR thrombectomy for patient's LVO (L ICA and L M1) including a 10% risk of ICH. [name] gave informed consent to proceed.   Recommendations   - Code IR activated and [facility] [place] - Patient will be met at bridge in ED by neurohospitalist and [facility] [place]. JCorrie Mckusickof neuro IR and Dr. SLesleigh Noe- Pt will be admitted after thrombectomy procedure  to neuro ICU at [facility] under Dr. Curly Shores.  ______________________________________________________________________   Thank you for the opportunity to take part in the care of this patient. If you have any further questions, please contact the neurology consultation  attending.  Signed,  Su Monks, MD [facility] [phone]  If 7pm- 7am, please page neurology on call as listed in [place].

## 2020-11-02 ENCOUNTER — Encounter (HOSPITAL_COMMUNITY)
Admission: AD | Disposition: A | Discharge status: Rehabilitation Facility | Payer: Self-pay | Source: Home / Self Care | Attending: Neurology

## 2020-11-02 ENCOUNTER — Inpatient Hospital Stay (HOSPITAL_COMMUNITY): Payer: Medicare HMO

## 2020-11-02 ENCOUNTER — Telehealth: Payer: Self-pay | Admitting: Internal Medicine

## 2020-11-02 DIAGNOSIS — R9431 Abnormal electrocardiogram [ECG] [EKG]: Secondary | ICD-10-CM

## 2020-11-02 DIAGNOSIS — I5023 Acute on chronic systolic (congestive) heart failure: Secondary | ICD-10-CM

## 2020-11-02 HISTORY — PX: IR US GUIDE VASC ACCESS RIGHT: IMG2390

## 2020-11-02 LAB — MAGNESIUM: Magnesium: 2 mg/dL (ref 1.7–2.4)

## 2020-11-02 LAB — BASIC METABOLIC PANEL
Anion gap: 11 (ref 5–15)
BUN: 11 mg/dL (ref 8–23)
CO2: 23 mmol/L (ref 22–32)
Calcium: 8 mg/dL — ABNORMAL LOW (ref 8.9–10.3)
Chloride: 102 mmol/L (ref 98–111)
Creatinine, Ser: 0.85 mg/dL (ref 0.44–1.00)
GFR, Estimated: >60 mL/min (ref >60)
Glucose, Bld: 85 mg/dL (ref 70–99)
Potassium: 3.7 mmol/L (ref 3.5–5.1)
Sodium: 136 mmol/L (ref 135–145)

## 2020-11-02 LAB — CBC
HCT: 28.9 % — ABNORMAL LOW (ref 36.0–46.0)
Hemoglobin: 9 g/dL — ABNORMAL LOW (ref 12.0–15.0)
MCH: 28 pg (ref 26.0–34.0)
MCHC: 31.1 g/dL (ref 30.0–36.0)
MCV: 89.8 fL (ref 80.0–100.0)
Platelets: 377 10*3/uL (ref 150–400)
RBC: 3.22 MIL/uL — ABNORMAL LOW (ref 3.87–5.11)
RDW: 18 % — ABNORMAL HIGH (ref 11.5–15.5)
WBC: 8 10*3/uL (ref 4.0–10.5)
nRBC: 0 % (ref 0.0–0.2)

## 2020-11-02 LAB — GLUCOSE, CAPILLARY
Glucose-Capillary: 94 mg/dL (ref 70–99)
Glucose-Capillary: 83 mg/dL (ref 70–99)
Glucose-Capillary: 91 mg/dL (ref 70–99)
Glucose-Capillary: 80 mg/dL (ref 70–99)
Glucose-Capillary: 125 mg/dL — ABNORMAL HIGH (ref 70–99)
Glucose-Capillary: 101 mg/dL — ABNORMAL HIGH (ref 70–99)

## 2020-11-02 LAB — ECHOCARDIOGRAM COMPLETE
AR max vel: 2.12 cm2
AV Peak grad: 2.9 mmHg
Ao pk vel: 0.85 m/s
Area-P 1/2: 4.49 cm2
Height: 63 in
MV VTI: 0.62 cm2
S' Lateral: 3 cm
Weight: 1523.82 oz

## 2020-11-02 LAB — PHOSPHORUS: Phosphorus: 3.1 mg/dL (ref 2.5–4.6)

## 2020-11-02 LAB — HEMOGLOBIN A1C
Hgb A1c MFr Bld: 5.8 % — ABNORMAL HIGH (ref 4.8–5.6)
Mean Plasma Glucose: 119.76 mg/dL

## 2020-11-02 LAB — LIPID PANEL
Cholesterol: 144 mg/dL (ref 0–200)
HDL: 48 mg/dL (ref >40)
LDL Cholesterol: 81 mg/dL (ref 0–99)
Total CHOL/HDL Ratio: 3 RATIO
Triglycerides: 76 mg/dL (ref <150)
VLDL: 15 mg/dL (ref 0–40)

## 2020-11-02 LAB — TRIGLYCERIDES: Triglycerides: 72 mg/dL (ref <150)

## 2020-11-02 SURGERY — IR WITH ANESTHESIA
Anesthesia: General

## 2020-11-02 MED ORDER — EZETIMIBE 10 MG PO TABS
10.0000 mg | ORAL_TABLET | Freq: Every day | ORAL | Status: DC
  Administered 2020-11-02 – 2020-11-04 (×2): 10 mg via ORAL
  Filled 2020-11-02 (×3): qty 1

## 2020-11-02 MED ORDER — DILTIAZEM HCL 30 MG PO TABS
30.0000 mg | ORAL_TABLET | Freq: Two times a day (BID) | ORAL | Status: DC
  Administered 2020-11-02: 30 mg via ORAL
  Filled 2020-11-02 (×3): qty 1

## 2020-11-02 MED ORDER — BOOST / RESOURCE BREEZE PO LIQD CUSTOM
1.0000 | Freq: Three times a day (TID) | ORAL | Status: DC
  Administered 2020-11-02 – 2020-11-04 (×4): 1 via ORAL

## 2020-11-02 MED ORDER — RIVAROXABAN 15 MG PO TABS
15.0000 mg | ORAL_TABLET | Freq: Every day | ORAL | Status: DC
  Administered 2020-11-02 – 2020-11-03 (×2): 15 mg via ORAL
  Filled 2020-11-02 (×2): qty 1

## 2020-11-02 NOTE — Telephone Encounter (Signed)
Patients daughter is calling in to see if it ok for her to get the most recent COVID shot.Please advise.

## 2020-11-02 NOTE — Evaluation (Signed)
Clinical/Bedside Swallow Evaluation Patient Details  Name: ADALY MCLINN MRN: [id number] Date of Birth: Apr 06, 1930  Today's Date: 11/02/2020 Time: SLP Start Time (ACUTE ONLY): 62 SLP Stop Time (ACUTE ONLY): 1440 SLP Time Calculation (min) (ACUTE ONLY): 15 min  Past Medical History:  Past Medical History:  Diagnosis Date   Atrial fibrillation ([place])    Benign breast cyst in female, left 10/07/2016   Colon adenomas    GERD (gastroesophageal reflux disease)    Hypertension    Hypothyroidism    Macular degeneration    Mitral regurgitation    Pulmonary embolism ([place]) 02/2016   SVT (supraventricular tachycardia) ([place])    Past Surgical History:  Past Surgical History:  Procedure Laterality Date   APPENDECTOMY  1960   BREAST CYST ASPIRATION Left 2017   CARDIOVERSION N/A 04/05/2016   Procedure: Cardioversion;  Surgeon: [name], MD;  Location: [facility];  Service: Cardiovascular;  Laterality: N/A;   CHOLECYSTECTOMY  1985   ESOPHAGOGASTRODUODENOSCOPY (EGD) WITH PROPOFOL N/A 11/27/2019   Procedure: ESOPHAGOGASTRODUODENOSCOPY (EGD) WITH PROPOFOL;  Surgeon: Lesly Rubenstein, MD;  Location: [facility];  Service: Endoscopy;  Laterality: N/A;   ESOPHAGOGASTRODUODENOSCOPY (EGD) WITH PROPOFOL N/A 12/24/2019   Procedure: ESOPHAGOGASTRODUODENOSCOPY (EGD) WITH PROPOFOL;  Surgeon: Lesly Rubenstein, MD;  Location: [facility];  Service: Endoscopy;  Laterality: N/A;   IR CT HEAD LTD  11/01/2020   IR PERCUTANEOUS ART THROMBECTOMY/INFUSION INTRACRANIAL INC DIAG ANGIO  11/01/2020   IR US GUIDE VASC ACCESS RIGHT  11/02/2020   OVARIAN CYST REMOVAL     RADIOLOGY WITH ANESTHESIA N/A 11/01/2020   Procedure: RADIOLOGY WITH ANESTHESIA;  Surgeon: Luanne Bras, MD;  Location: [place];  Service: Radiology;  Laterality: N/A;   TEE WITHOUT CARDIOVERSION N/A 02/01/2018   Procedure: TRANSESOPHAGEAL ECHOCARDIOGRAM (TEE);  Surgeon: [name], MD;  Location: [facility];  Service:  Cardiovascular;  Laterality: N/A;   HPI:  87 y.o. F with PMHx of atrial fibrillation, mitral valve stenosis w/ regurgitation, PE, hypertension, hypothyroidism, anxiety.  She initially presented to [facility] hospital on 9/18 with worsening shortness of breath, palpitations, fatigue and edema that had started on 8/17, admitted for atrial fibrillation with RVR concern for potential left lower lobe pneumonia with a mildly elevated BNP from baseline of 900 to 1000 concerning for acute decompensated heart failure. Imaging revealed tiny acute infarcts at the left basal ganglia.    Assessment / Plan / Recommendation  Clinical Impression  Pt was seen for a bedside swallow evaluation. Pt positioned upright in bed and able to complete oral care independently. Pt reports history of esophageal swallowing impairment c/b stricture treated with stretching requiring small bites and slow rate. Pt reports noticing no change in symptoms after stretching procedure. SLP trialed small sips of thin liquid, puree, and regular solids. Overall, pt oral and pharyngeal phase largely intact with no overt s/s of penetration or aspiration. Pt does take very small bites, requiring 4 bites to finish one teaspoon of puree, suspected d/t pt use of strategies to compensate for esophageal impairments. Recommend regular/thin liquid diet. Administer medications whole with applesauce or crushed if large. SLP will follow for skilled observation of diet tolerance. SLP Visit Diagnosis: Dysphagia, unspecified (R13.10)    Aspiration Risk  Mild aspiration risk    Diet Recommendation Regular;Thin liquid   Liquid Administration via: Cup;Straw Medication Administration: Whole meds with puree Supervision: Patient able to self feed Postural Changes: Seated upright at 90 degrees;Remain upright for at least 30 minutes after po intake  Other  Recommendations Oral Care Recommendations: Oral care BID    Recommendations for follow up therapy are one  component of a multi-disciplinary discharge planning process, led by the attending physician.  Recommendations may be updated based on patient status, additional functional criteria and insurance authorization.  Follow up Recommendations Outpatient SLP;24 hour supervision/assistance      Frequency and Duration min 1 x/week  1 week       Prognosis Prognosis for Safe Diet Advancement: Good      Swallow Study   General HPI: 87 y.o. F with PMHx of atrial fibrillation, mitral valve stenosis w/ regurgitation, PE, hypertension, hypothyroidism, anxiety.  She initially presented to [facility] hospital on 9/18 with worsening shortness of breath, palpitations, fatigue and edema that had started on 8/17, admitted for atrial fibrillation with RVR concern for potential left lower lobe pneumonia with a mildly elevated BNP from baseline of 900 to 1000 concerning for acute decompensated heart failure. Imaging revealed tiny acute infarcts at the left basal ganglia. Type of Study: Bedside Swallow Evaluation Previous Swallow Assessment: n/a Diet Prior to this Study: NPO Temperature Spikes Noted: No Respiratory Status: Nasal cannula History of Recent Intubation: Yes Length of Intubations (days): 1 days Date extubated: 11/02/20 Behavior/Cognition: Alert;Cooperative;Pleasant mood Oral Cavity Assessment: Within Functional Limits Oral Care Completed by SLP: Yes Oral Cavity - Dentition: Dentures, top (partial, bottom; not worn) Vision: Functional for self-feeding Self-Feeding Abilities: Able to feed self Patient Positioning: Upright in bed Baseline Vocal Quality: Low vocal intensity;Hoarse Volitional Cough: Strong Volitional Swallow: Able to elicit    Oral/Motor/Sensory Function Overall Oral Motor/Sensory Function: Mild impairment Facial ROM: Within Functional Limits Facial Symmetry: Within Functional Limits Facial Strength: Within Functional Limits Facial Sensation: Within Functional Limits Lingual ROM:  Within Functional Limits Lingual Symmetry: Within Functional Limits Lingual Strength: Reduced Velum: Within Functional Limits Mandible: Within Functional Limits   Ice Chips Ice chips: Not tested   Thin Liquid Thin Liquid: Within functional limits Presentation: Cup;Straw    Nectar Thick Nectar Thick Liquid: Not tested   Honey Thick Honey Thick Liquid: Not tested   Puree Puree: Within functional limits Presentation: Self Fed;Spoon   Solid     Solid: Within functional limits Presentation: Self Fed     Dewitt Rota, SLP-Student  Dewitt Rota 11/02/2020,4:18 PM

## 2020-11-02 NOTE — Telephone Encounter (Signed)
Pt's daughter called back and aware that it is recommended that her mother get the covid vaccine.

## 2020-11-02 NOTE — Anesthesia Postprocedure Evaluation (Deleted)
Anesthesia Post Note  Patient: Frances Kent  Procedure(s) Performed: RADIOLOGY WITH ANESTHESIA     Patient location during evaluation: ICU Anesthesia Type: General Level of consciousness: patient remains intubated per anesthesia plan Pain management: pain level controlled Vital Signs Assessment: post-procedure vital signs reviewed and stable Respiratory status: patient remains intubated per anesthesia plan Cardiovascular status: stable Postop Assessment: no apparent nausea or vomiting Anesthetic complications: no   No notable events documented.  Last Vitals:  Vitals:   11/01/20 2341 11/02/20 0000  BP: 137/66 (!) 86/64  Pulse: 93 (!) 104  Resp: (!) 30 19  Temp:  36.5 C  SpO2: 97% 93%    Last Pain:  Vitals:   11/01/20 2138  TempSrc: Oral                 Keila Turan

## 2020-11-02 NOTE — Addendum Note (Signed)
Addendum  created 11/02/20 0118 by Belinda Block, MD   Delete clinical note

## 2020-11-02 NOTE — Evaluation (Signed)
Physical Therapy Evaluation Patient Details Name: Frances Kent MRN: [id number] DOB: 1930-11-16 Today's Date: 11/02/2020  History of Present Illness  Pt presented to [facility] on 9/17 with SOB, weakness and heart palpitations. Pt adm for Afib with RVR and mild CHF exacerbation. On 9/18 around 1800 nurse witnessed rt sided weakness and garbled speech. Code stroke called. CT showed lt ICA occlusion and pt transferred to [facility] for emergent mechanical thrombectomy.  PMH: HTN, Afib, PE, and GERD.  Clinical Impression  Pt now presenting with change in functional status since evaluated by PT at [facility] yesterday prior to lt CVA. Pt now with decline in balance, gait, and activity tolerance. Recommend CIR for further rehab.        Recommendations for follow up therapy are one component of a multi-disciplinary discharge planning process, led by the attending physician.  Recommendations may be updated based on patient status, additional functional criteria and insurance authorization.  Follow Up Recommendations CIR    Equipment Recommendations  None recommended by PT;Wheelchair (measurements PT) (Pt has rollator)    Recommendations for Other Services Rehab consult     Precautions / Restrictions Precautions Precautions: Fall      Mobility  Bed Mobility Overal bed mobility: Needs Assistance Bed Mobility: Supine to Sit     Supine to sit: Min assist;HOB elevated     General bed mobility comments: Assist to elevate trunk into sitting and to bring hips to EOB    Transfers Overall transfer level: Needs assistance Equipment used: 4-wheeled walker Transfers: Sit to/from Stand Sit to Stand: Min assist         General transfer comment: Assist to bring hips up and for balance.  Ambulation/Gait Ambulation/Gait assistance: Min assist Gait Distance (Feet): 15 Feet Assistive device: 4-wheeled walker Gait Pattern/deviations: Step-through pattern;Decreased step length - right;Decreased step length  - left;Shuffle;Trunk flexed Gait velocity: decr Gait velocity interpretation: <1.31 ft/sec, indicative of household ambulator General Gait Details: Assist for balance and support. Pt running into obstacles on rt side.  Stairs            Wheelchair Mobility    Modified Rankin (Stroke Patients Only) Modified Rankin (Stroke Patients Only) Pre-Morbid Rankin Score: Slight disability Modified Rankin: Moderately severe disability     Balance Overall balance assessment: Needs assistance Sitting-balance support: No upper extremity supported;Feet supported Sitting balance-Leahy Scale: Fair     Standing balance support: Bilateral upper extremity supported Standing balance-Leahy Scale: Poor Standing balance comment: rollator and min assist for static standing. Pt with slight posterior lean                             Pertinent Vitals/Pain Pain Assessment: No/denies pain Faces Pain Scale: No hurt    Home Living Family/patient expects to be discharged to:: Private residence Living Arrangements: Other relatives Available Help at Discharge: Family;Available 24 hours/day Type of Home: House Home Access: Level entry     Home Layout: Laundry or work area in basement;Able to live on main level with bedroom/bathroom Home Equipment: Environmental consultant - 4 wheels;Shower seat;Grab bars - tub/shower Additional Comments: Reports not using rollator, does use grab bars and shower chair for safety.    Prior Function Level of Independence: Independent   Gait / Transfers Assistance Needed: reports ambulating short distances only.  ADL's / Homemaking Assistance Needed: Able to perform all I/ADLs. Has rarely driven lately in past 1-2 weeks d/t fatigue, but is able to drive at baseline. Family assists with  laundry as it is in the basement        Hand Dominance   Dominant Hand: Left    Extremity/Trunk Assessment   Upper Extremity Assessment Upper Extremity Assessment: Defer to OT  evaluation    Lower Extremity Assessment Lower Extremity Assessment: Generalized weakness       Communication      Cognition Arousal/Alertness: Awake/alert Behavior During Therapy: WFL for tasks assessed/performed Overall Cognitive Status: Impaired/Different from baseline Area of Impairment: Following commands;Problem solving                       Following Commands: Follows one step commands consistently;Follows one step commands with increased time     Problem Solving: Slow processing;Decreased initiation;Difficulty sequencing;Requires verbal cues;Requires tactile cues General Comments: rt in attention with mobility      General Comments General comments (skin integrity, edema, etc.): VSS on RA    Exercises     Assessment/Plan    PT Assessment Patient needs continued PT services  PT Problem List Decreased strength;Decreased balance;Decreased cognition;Decreased mobility;Decreased safety awareness;Decreased activity tolerance       PT Treatment Interventions DME instruction;Functional mobility training;Balance training;Patient/family education;Neuromuscular re-education;Gait training;Therapeutic activities;Stair training;Therapeutic exercise;Cognitive remediation    PT Goals (Current goals can be found in the Care Plan section)  Acute Rehab PT Goals Patient Stated Goal: return home PT Goal Formulation: With patient Time For Goal Achievement: 11/16/20 Potential to Achieve Goals: Good    Frequency Min 4X/week   Barriers to discharge        Co-evaluation               AM-PAC PT "6 Clicks" Mobility  Outcome Measure Help needed turning from your back to your side while in a flat bed without using bedrails?: A Little Help needed moving from lying on your back to sitting on the side of a flat bed without using bedrails?: A Little Help needed moving to and from a bed to a chair (including a wheelchair)?: A Little Help needed standing up from a chair  using your arms (e.g., wheelchair or bedside chair)?: A Little Help needed to walk in hospital room?: A Little Help needed climbing 3-5 steps with a railing? : A Lot 6 Click Score: 17    End of Session Equipment Utilized During Treatment: Gait belt Activity Tolerance: Patient limited by fatigue Patient left: in chair;with call bell/phone within reach;with chair alarm set Nurse Communication: Mobility status PT Visit Diagnosis: Unsteadiness on feet (R26.81);Muscle weakness (generalized) (M62.81);Difficulty in walking, not elsewhere classified (R26.2)    Time: [id number] PT Time Calculation (min) (ACUTE ONLY): 27 min   Charges:   PT Evaluation $PT Eval Moderate Complexity: 1 Mod PT Treatments $Gait Training: 8-22 mins        [place] Pager [phone] Office [place] 11/02/2020, 4:03 PM

## 2020-11-02 NOTE — Progress Notes (Addendum)
STROKE TEAM PROGRESS NOTE   INTERVAL HISTORY Today there are no visitors at bedside. No acute events. Extubated this morning. Denies shortness of breath, reports breathing feels better. She is alert and following commands well. She is aware she is admitted for stroke. She had no questions. Dr. Erlinda Hong explained her diagnosis, ongoing work up and plan of care. She did not have questions at present.     Vitals:   11/02/20 0545 11/02/20 0638 11/02/20 0800 11/02/20 0900  BP: 138/72  95/66   Pulse:   87   Resp:   16   Temp:   98 F (36.7 C)   TempSrc:   Axillary   SpO2:  (!) 76% 98% 100%  Weight:       CBC:  Recent Labs  Lab 11/01/20 0532 11/01/20 2209 11/02/20 0509  WBC 7.8  --  8.0  HGB 10.3* 10.2* 9.0*  HCT 32.4* 30.0* 28.9*  MCV 88.8  --  89.8  PLT 396  --  [id number]   Basic Metabolic Panel:  Recent Labs  Lab 11/01/20 0532 11/01/20 2209 11/02/20 0509  NA 138 138 136  K 4.7 3.5 3.7  CL 105  --  102  CO2 28  --  23  GLUCOSE 103*  --  85  BUN 18  --  11  CREATININE 1.01*  --  0.85  CALCIUM 8.4*  --  8.0*  MG 2.1  --  2.0  PHOS 2.6  --  3.1   Lipid Panel:  Recent Labs  Lab 11/02/20 0509  CHOL 144  TRIG 76  72  HDL 48  CHOLHDL 3.0  VLDL 15  LDLCALC 81   HgbA1c:   Urine Drug Screen: No results for input(s): LABOPIA, COCAINSCRNUR, LABBENZ, AMPHETMU, THCU, LABBARB in the last 168 hours.  Alcohol Level No results for input(s): ETH in the last 168 hours.  IMAGING past 24 hours MR ANGIO HEAD WO CONTRAST  Result Date: 11/02/2020 CLINICAL DATA:  Stroke follow-up EXAM: MRI HEAD WITHOUT CONTRAST MRA HEAD WITHOUT CONTRAST TECHNIQUE: Multiplanar, multi-echo pulse sequences of the brain and surrounding structures were acquired without intravenous contrast. Angiographic images of the Circle of Willis were acquired using MRA technique without intravenous contrast. COMPARISON:  CTA from yesterday FINDINGS: MRI HEAD FINDINGS Brain: Tiny acute infarcts at the left basal ganglia. No  acute hemorrhage, hydrocephalus, collection, or masslike finding age normal brain volume Vascular: See below Skull and upper cervical spine: Normal marrow signal Sinuses/Orbits: Partial right mastoid opacification with negative nasopharynx. Bilateral cataract resection. MRA HEAD FINDINGS Anterior circulation: Patent carotid, M1, A1 segments. No evidence of branch occlusion Posterior circulation: Vertebral, basilar, and proximal posterior cerebral arteries are patent without evidence of branch occlusion. Anatomic variants: None significant Extensive motion artifact which could easily obscure pathology. IMPRESSION: 1. Tiny acute infarcts at the left basal ganglia. 2. The left MCA remains patent. 3. Significant motion artifact. Electronically Signed   By: Jorje Guild M.D.   On: 11/02/2020 06:49   MR BRAIN WO CONTRAST  Result Date: 11/02/2020 CLINICAL DATA:  Stroke follow-up EXAM: MRI HEAD WITHOUT CONTRAST MRA HEAD WITHOUT CONTRAST TECHNIQUE: Multiplanar, multi-echo pulse sequences of the brain and surrounding structures were acquired without intravenous contrast. Angiographic images of the Circle of Willis were acquired using MRA technique without intravenous contrast. COMPARISON:  CTA from yesterday FINDINGS: MRI HEAD FINDINGS Brain: Tiny acute infarcts at the left basal ganglia. No acute hemorrhage, hydrocephalus, collection, or masslike finding age normal brain volume Vascular: See below Skull  and upper cervical spine: Normal marrow signal Sinuses/Orbits: Partial right mastoid opacification with negative nasopharynx. Bilateral cataract resection. MRA HEAD FINDINGS Anterior circulation: Patent carotid, M1, A1 segments. No evidence of branch occlusion Posterior circulation: Vertebral, basilar, and proximal posterior cerebral arteries are patent without evidence of branch occlusion. Anatomic variants: None significant Extensive motion artifact which could easily obscure pathology. IMPRESSION: 1. Tiny acute  infarcts at the left basal ganglia. 2. The left MCA remains patent. 3. Significant motion artifact. Electronically Signed   By: Jorje Guild M.D.   On: 11/02/2020 06:49   IR CT Head Ltd  Result Date: 11/02/2020 INDICATION: 87 year old female with acute left-sided ICA terminus occlusion, presents for angiogram and mechanical thrombectomy EXAM: ULTRASOUND-GUIDED ACCESS RIGHT COMMON FEMORAL ARTERY CERVICAL AND CEREBRAL ANGIOGRAM MECHANICAL THROMBECTOMY LEFT ICA/MCA SPIN CT ANGIO-SEAL FOR HEMOSTASIS COMPARISON:  CT imaging same day MEDICATIONS: None ANESTHESIA/SEDATION: The anesthesia team was present to provide general endotracheal tube anesthesia and for patient monitoring during the procedure. Intubation was performed in room 2, neuro biplane room. Left radial arterial line was performed by the anesthesia team. Interventional neuro radiology nursing staff was also present. CONTRAST:  64 cc FLUOROSCOPY TIME:  Fluoroscopy Time: 15 minutes 12 seconds (793.6 mGy). COMPLICATIONS: None TECHNIQUE: Informed written consent was obtained from the patient's family after a thorough discussion of the procedural risks, benefits and alternatives. Specific risks discussed include: Bleeding, infection, contrast reaction, kidney injury/failure, need for further procedure/surgery, arterial injury or dissection, embolization to new territory, intracranial hemorrhage (10-15% risk), neurologic deterioration, cardiopulmonary collapse, death. All questions were addressed. Maximal Sterile Barrier Technique was utilized including during the procedure including caps, mask, sterile gowns, sterile gloves, sterile drape, hand hygiene and skin antiseptic. A timeout was performed prior to the initiation of the procedure. The anesthesia team was present to provide general endotracheal tube anesthesia and for patient monitoring during the procedure. Interventional neuro radiology nursing staff was also present. FINDINGS: Initial Findings:  Aortic arch is 3 vessel arch, type 3. Left common carotid artery:  Normal course caliber and contour. Left external carotid artery: Patent with antegrade flow. Left internal carotid artery: Significant accordion appearance of the cervical ICA in the proximal and mid segment, less prominent at the skull base, compatible with fibromuscular dysplasia. There is slow flow through the distal cervical segment, including the horizontal and vertical petrous segment, the lateral segment, cavernous segment and supra ophthalmic segment. There is a meniscus occlusion before the terminus with no flow into either the ACA or the MCA. Left MCA:  No flow into the MCA on the initial. Left ACA:  No flow into the ACA on the initial. Completion Findings: Left MCA: Complete restoration of flow through the MCA after the second pass. No significant posterior cerebral artery, with patent anterior choroidal artery. The left temporal lobe perfused by the PCA, with the preoperative CT demonstrating filling through the temporal lobe. TICI 3: Complete perfusion of the territory Flat panel CT: No hemorrhage identified. PROCEDURE: The anesthesia team was present to provide general endotracheal tube anesthesia and for patient monitoring during the procedure. Intubation was performed in negative pressure [place] in neuro IR holding. Interventional neuro radiology nursing staff was also present. Ultrasound survey of the right inguinal region was performed with images stored and sent to PACs. 11 blade scalpel was used to make a small incision. Blunt dissection was performed with US guidance. A micropuncture needle was used access the right common femoral artery under ultrasound. With excellent arterial blood flow returned, an .018 micro  wire was passed through the needle, observed to enter the abdominal aorta under fluoroscopy. The needle was removed, and a micropuncture sheath was placed over the wire. The inner dilator and wire were removed, and an 035  wire was advanced under fluoroscopy into the abdominal aorta. The sheath was removed and a 25cm 69F straight vascular sheath was placed. The dilator was removed and the sheath was flushed. Sheath was attached to pressurized and heparinized saline bag for constant forward flow. A David catheter was then advanced over the 035 wire to the proximal descending thoracic aorta. The wire was then removed and the David catheter was double flushed. David catheter was then used to select the origin of the left common carotid artery. A standard Glidewire was then advanced into the cervical ICA. David catheter was then advanced into the distal segment of the cervical ICA. Glidewire was removed, catheter was double flush, and angiogram confirmed location. A rose in wire was then passed into the distal cervical segment and the David catheter was removed. Coaxial system was then advanced over the [employer] wire. This included a 95cm 087 "Walrus" balloon guide with coaxial 100 cm Davis diagnostic catheter. Once the coaxial system was into the distal cervical segment, the David catheter and the [facility] an wire were removed from the balloon guide with adequate flush at the hub of the balloon guide. Double flush was performed. Formal angiogram was performed. Road map function was used once the occluded vessel was identified. Copious back flush was performed and the balloon catheter was attached to heparinized and pressurized saline bag for forward flow. A second coaxial system was then advanced through the balloon catheter, which included the selected intermediate catheter, microcatheter, and microwire. In this scenario, the set up included a zoom 71 aspiration catheter, a Trevo Provue18 microcatheter, and 014 synchro soft wire. This system was advanced through the balloon guide catheter under the road-map function, with adequate back-flush at the rotating hemostatic valve at that back end of the balloon guide. Microcatheter and the  intermediate catheter system were advanced through the carotid siphon to the level of the occlusion. Some manipulation was required around the ophthalmic artery origin, as withdrawal of the zoom catheter was performed with manual rotation to attempt a more suitable configuration at the ophthalmic artery origin. Once the zoom catheter was positioned proximal to the occlusion and beyond the ophthalmic artery, the microwire and microcatheter were removed. Rotating hemostatic valve was removed. The proprietary engine was then attached to the hub of the zoom catheter, with aspiration initiated as the first pass. With blood return confirmed the catheter was gently advanced into the site of the occlusion and passed into the proximal M1 segment. We observed approximately 60 seconds of aspiration time. Before withdrawing the aspiration catheter into the balloon guide, the balloon on the tip of the balloon guide was gently inflated under fluoroscopy. We then withdrew the aspiration catheter into the balloon guide confirmed free flow of blood return from the zoom catheter, we withdrew the aspiration catheter and then confirmed free flow of blood at the hub of the balloon guide catheter. Balloon was deflated and then angiogram was performed. This restored flow through the terminus segment into the A1 segment. There was migration of embolus into the proximal M1 segment, with no flow through the proximal M1. We elected a second pass. The same coaxial system was then advanced through the balloon catheter, which included the zoom 71 aspiration catheter, a Trevo Provue18 microcatheter, and 014 synchro soft  wire. This system was advanced through the balloon guide catheter under the road-map function, with adequate back-flush at the rotating hemostatic valve at that back end of the balloon guide. The microwire and microcatheter were navigated with a looped microwire configuration through the terminus segment into the MCA. The  microwire was passed beyond the site of the occlusion, with the microcatheter gently pushed through over the microwire into the distal M1 segment to the insular region. Zoom catheter was then positioned in a location at the terminus directed towards the MCA. Microwire was gently removed from the microcatheter with adequate flush at the hub. Blood was then aspirated through the hub of the microcatheter, and a gentle contrast injection was performed confirming intraluminal position. A rotating hemostatic valve was then attached to the back end of the microcatheter, and a pressurized and heparinized saline bag was attached to the catheter. 4 x 40 solitaire device was then selected. Back flush was achieved at the rotating hemostatic valve, and then the device was gently advanced through the microcatheter to the distal end. The retriever was then unsheathed by withdrawing the microcatheter under fluoroscopy. Once the retriever was completely unsheathed, the microcatheter was carefully stripped from the delivery device. Control angiogram was performed from the intermediate catheter. A 3 minute time interval was observed. The balloon at the balloon guide catheter was then inflated under fluoroscopy for proximal flow arrest. Constant aspiration using the proprietary engine was then performed at the zoom 71 catheter, which was slowly advanced until stasis of blood flow was observed. The retriever was then gently and slowly withdrawn with fluoroscopic observation. Once the retriever was "corked" within the tip of the intermediate catheter, both were removed from the system. Free aspiration was confirmed at the hub of the balloon guide catheter, with free blood return confirmed. The balloon was then deflated, and a control angiogram was performed. Restoration of flow was confirmed. Angiogram of the cervical ICA was performed. Balloon guide was then removed. The skin at the puncture site was then cleaned with Chlorhexidine. The  8 French sheath was removed and an 48F angioseal was deployed. Flat panel CT was performed. Patient tolerated the procedure well and remained hemodynamically stable throughout. No complications were encountered and no significant blood loss encountered. IMPRESSION: Status post ultrasound guided access right common femoral artery for left-sided cervical/cerebral angiogram and treatment of terminus/MCA occlusion with 2 passes of aspiration thrombectomy/mechanical thrombectomy, achieving TICI 3 flow. Angio-Seal for hemostasis. Angiogram reveals significant fibromuscular dysplasia (FMD) of the left cervical ICA. Signed, Dulcy Fanny. Dellia Nims, [facility] Radiology PLAN: The patient will remain intubated. ICU status Target systolic blood pressure of 120-140 Right hip straight time 4 hours Frequent neurovascular checks Repeat neurologic imaging with CT and/MRI at the discretion of neurology team Electronically Signed   By: Corrie Mckusick D.O.   On: 11/02/2020 08:32   IR US Guide Vasc Access Right  Result Date: 11/02/2020 INDICATION: 87 year old female with acute left-sided ICA terminus occlusion, presents for angiogram and mechanical thrombectomy EXAM: ULTRASOUND-GUIDED ACCESS RIGHT COMMON FEMORAL ARTERY CERVICAL AND CEREBRAL ANGIOGRAM MECHANICAL THROMBECTOMY LEFT ICA/MCA SPIN CT ANGIO-SEAL FOR HEMOSTASIS COMPARISON:  CT imaging same day MEDICATIONS: None ANESTHESIA/SEDATION: The anesthesia team was present to provide general endotracheal tube anesthesia and for patient monitoring during the procedure. Intubation was performed in room 2, neuro biplane room. Left radial arterial line was performed by the anesthesia team. Interventional neuro radiology nursing staff was also present. CONTRAST:  64 cc FLUOROSCOPY TIME:  Fluoroscopy Time: 15 minutes 12 seconds (793.6 mGy). COMPLICATIONS: None TECHNIQUE: Informed written consent was obtained from the patient's family after  a thorough discussion of the procedural risks, benefits and alternatives. Specific risks discussed include: Bleeding, infection, contrast reaction, kidney injury/failure, need for further procedure/surgery, arterial injury or dissection, embolization to new territory, intracranial hemorrhage (10-15% risk), neurologic deterioration, cardiopulmonary collapse, death. All questions were addressed. Maximal Sterile Barrier Technique was utilized including during the procedure including caps, mask, sterile gowns, sterile gloves, sterile drape, hand hygiene and skin antiseptic. A timeout was performed prior to the initiation of the procedure. The anesthesia team was present to provide general endotracheal tube anesthesia and for patient monitoring during the procedure. Interventional neuro radiology nursing staff was also present. FINDINGS: Initial Findings: Aortic arch is 3 vessel arch, type 3. Left common carotid artery:  Normal course caliber and contour. Left external carotid artery: Patent with antegrade flow. Left internal carotid artery: Significant accordion appearance of the cervical ICA in the proximal and mid segment, less prominent at the skull base, compatible with fibromuscular dysplasia. There is slow flow through the distal cervical segment, including the horizontal and vertical petrous segment, the lateral segment, cavernous segment and supra ophthalmic segment. There is a meniscus occlusion before the terminus with no flow into either the ACA or the MCA. Left MCA:  No flow into the MCA on the initial. Left ACA:  No flow into the ACA on the initial. Completion Findings: Left MCA: Complete restoration of flow through the MCA after the second pass. No significant posterior cerebral artery, with patent anterior choroidal artery. The left temporal lobe perfused by the PCA, with the preoperative CT demonstrating filling through the temporal lobe. TICI 3: Complete perfusion of the territory Flat panel CT: No  hemorrhage identified. PROCEDURE: The anesthesia team was present to provide general endotracheal tube anesthesia and for patient monitoring during the procedure. Intubation was performed in negative pressure [place] in neuro IR holding. Interventional neuro radiology nursing staff was also present. Ultrasound survey of the right inguinal region was performed with images stored and sent to PACs. 11 blade scalpel was used to make a small incision. Blunt dissection was performed with US guidance. A micropuncture needle was used access the right common femoral artery under ultrasound. With excellent arterial blood flow returned, an .018 micro wire was passed through the needle, observed to enter the abdominal aorta under fluoroscopy. The needle was removed, and a micropuncture sheath was placed over the wire. The inner dilator and wire were removed, and an 035 wire was advanced under fluoroscopy into the abdominal aorta. The sheath was removed and a 25cm 35F straight vascular sheath was placed. The dilator was removed and the sheath was flushed. Sheath was attached to pressurized and heparinized saline bag for constant forward flow. A David catheter was then advanced over the 035 wire to the proximal descending thoracic aorta. The wire was then removed and the David catheter was double flushed. David catheter was then used to select the origin of the left common carotid artery. A standard Glidewire was then advanced into the cervical ICA. David catheter was then advanced into the distal segment of the cervical ICA. Glidewire was removed, catheter was double flush, and angiogram confirmed location. A rose in wire was then passed into the distal cervical segment and the David catheter was removed. Coaxial system was then advanced over the [employer] wire. This included a 95cm 087 "Walrus" balloon guide with coaxial 100 cm Davis diagnostic catheter. Once  the coaxial system was into the distal cervical segment, the David catheter  and the [facility] an wire were removed from the balloon guide with adequate flush at the hub of the balloon guide. Double flush was performed. Formal angiogram was performed. Road map function was used once the occluded vessel was identified. Copious back flush was performed and the balloon catheter was attached to heparinized and pressurized saline bag for forward flow. A second coaxial system was then advanced through the balloon catheter, which included the selected intermediate catheter, microcatheter, and microwire. In this scenario, the set up included a zoom 71 aspiration catheter, a Trevo Provue18 microcatheter, and 014 synchro soft wire. This system was advanced through the balloon guide catheter under the road-map function, with adequate back-flush at the rotating hemostatic valve at that back end of the balloon guide. Microcatheter and the intermediate catheter system were advanced through the carotid siphon to the level of the occlusion. Some manipulation was required around the ophthalmic artery origin, as withdrawal of the zoom catheter was performed with manual rotation to attempt a more suitable configuration at the ophthalmic artery origin. Once the zoom catheter was positioned proximal to the occlusion and beyond the ophthalmic artery, the microwire and microcatheter were removed. Rotating hemostatic valve was removed. The proprietary engine was then attached to the hub of the zoom catheter, with aspiration initiated as the first pass. With blood return confirmed the catheter was gently advanced into the site of the occlusion and passed into the proximal M1 segment. We observed approximately 60 seconds of aspiration time. Before withdrawing the aspiration catheter into the balloon guide, the balloon on the tip of the balloon guide was gently inflated under fluoroscopy. We then withdrew the aspiration catheter into the balloon guide confirmed free flow of blood return from the zoom catheter, we withdrew  the aspiration catheter and then confirmed free flow of blood at the hub of the balloon guide catheter. Balloon was deflated and then angiogram was performed. This restored flow through the terminus segment into the A1 segment. There was migration of embolus into the proximal M1 segment, with no flow through the proximal M1. We elected a second pass. The same coaxial system was then advanced through the balloon catheter, which included the zoom 71 aspiration catheter, a Trevo Provue18 microcatheter, and 014 synchro soft wire. This system was advanced through the balloon guide catheter under the road-map function, with adequate back-flush at the rotating hemostatic valve at that back end of the balloon guide. The microwire and microcatheter were navigated with a looped microwire configuration through the terminus segment into the MCA. The microwire was passed beyond the site of the occlusion, with the microcatheter gently pushed through over the microwire into the distal M1 segment to the insular region. Zoom catheter was then positioned in a location at the terminus directed towards the MCA. Microwire was gently removed from the microcatheter with adequate flush at the hub. Blood was then aspirated through the hub of the microcatheter, and a gentle contrast injection was performed confirming intraluminal position. A rotating hemostatic valve was then attached to the back end of the microcatheter, and a pressurized and heparinized saline bag was attached to the catheter. 4 x 40 solitaire device was then selected. Back flush was achieved at the rotating hemostatic valve, and then the device was gently advanced through the microcatheter to the distal end. The retriever was then unsheathed by withdrawing the microcatheter under fluoroscopy. Once the retriever was completely unsheathed, the microcatheter was  carefully stripped from the delivery device. Control angiogram was performed from the intermediate catheter. A 3  minute time interval was observed. The balloon at the balloon guide catheter was then inflated under fluoroscopy for proximal flow arrest. Constant aspiration using the proprietary engine was then performed at the zoom 71 catheter, which was slowly advanced until stasis of blood flow was observed. The retriever was then gently and slowly withdrawn with fluoroscopic observation. Once the retriever was "corked" within the tip of the intermediate catheter, both were removed from the system. Free aspiration was confirmed at the hub of the balloon guide catheter, with free blood return confirmed. The balloon was then deflated, and a control angiogram was performed. Restoration of flow was confirmed. Angiogram of the cervical ICA was performed. Balloon guide was then removed. The skin at the puncture site was then cleaned with Chlorhexidine. The 8 French sheath was removed and an 76F angioseal was deployed. Flat panel CT was performed. Patient tolerated the procedure well and remained hemodynamically stable throughout. No complications were encountered and no significant blood loss encountered. IMPRESSION: Status post ultrasound guided access right common femoral artery for left-sided cervical/cerebral angiogram and treatment of terminus/MCA occlusion with 2 passes of aspiration thrombectomy/mechanical thrombectomy, achieving TICI 3 flow. Angio-Seal for hemostasis. Angiogram reveals significant fibromuscular dysplasia (FMD) of the left cervical ICA. Signed, Dulcy Fanny. Dellia Nims, [facility] Radiology PLAN: The patient will remain intubated. ICU status Target systolic blood pressure of 120-140 Right hip straight time 4 hours Frequent neurovascular checks Repeat neurologic imaging with CT and/MRI at the discretion of neurology team Electronically Signed   By: Corrie Mckusick D.O.   On: 11/02/2020 08:32   DG Chest Port 1 View  Result Date: 11/01/2020 CLINICAL DATA:   Endotracheal tube EXAM: PORTABLE CHEST 1 VIEW COMPARISON:  10/31/2020 FINDINGS: Small left pleural effusion and basilar atelectasis. Normal cardiomediastinal contours. Unchanged chronic interstitial prominence. IMPRESSION: Small left pleural effusion and basilar atelectasis, unchanged. Electronically Signed   By: Ulyses Jarred M.D.   On: 11/01/2020 22:12   ECHOCARDIOGRAM COMPLETE  Result Date: 11/02/2020    ECHOCARDIOGRAM REPORT   Patient Name:   Frances Kent Date of Exam: 11/01/2020 Medical Rec #:  [id number]        Height:       63.0 in Accession #:    [id number]       Weight:       95.2 lb Date of Birth:  04/13/1930        BSA:          1.411 m Patient Age:    24 years         BP:           97/61 mmHg Patient Gender: F                HR:           95 bpm. Exam Location:  [facility] Procedure: 2D Echo, Cardiac Doppler and Color Doppler Indications:     Dyspnea R06.00  History:         Patient has prior history of Echocardiogram examinations. Risk                  Factors:Hypertension. MR, SVT.  Sonographer:     Alyse Low Roar Referring Phys:  [place] Diagnosing Phys: Isaias Cowman MD IMPRESSIONS  1. Left ventricular ejection fraction, by estimation, is 40 to 45%. The left ventricle has mildly  decreased function. The left ventricle has no regional wall motion abnormalities. Left ventricular diastolic parameters are indeterminate.  2. Right ventricular systolic function is normal. The right ventricular size is normal.  3. Left atrial size was moderately dilated.  4. Right atrial size was moderately dilated.  5. The mitral valve is normal in structure. Moderate to severe mitral valve regurgitation. No evidence of mitral stenosis.  6. Tricuspid valve regurgitation is moderate.  7. The aortic valve is normal in structure. Aortic valve regurgitation is trivial. No aortic stenosis is present.  8. The inferior vena cava is normal in size with greater than 50% respiratory variability, suggesting right  atrial pressure of 3 mmHg. FINDINGS  Left Ventricle: Left ventricular ejection fraction, by estimation, is 40 to 45%. The left ventricle has mildly decreased function. The left ventricle has no regional wall motion abnormalities. The left ventricular internal cavity size was normal in size. There is no left ventricular hypertrophy. Left ventricular diastolic parameters are indeterminate. Right Ventricle: The right ventricular size is normal. No increase in right ventricular wall thickness. Right ventricular systolic function is normal. Left Atrium: Left atrial size was moderately dilated. Right Atrium: Right atrial size was moderately dilated. Pericardium: There is no evidence of pericardial effusion. Mitral Valve: The mitral valve is normal in structure. Moderate to severe mitral valve regurgitation. No evidence of mitral valve stenosis. MV peak gradient, 17.1 mmHg. The mean mitral valve gradient is 6.0 mmHg. Tricuspid Valve: The tricuspid valve is normal in structure. Tricuspid valve regurgitation is moderate . No evidence of tricuspid stenosis. Aortic Valve: The aortic valve is normal in structure. Aortic valve regurgitation is trivial. No aortic stenosis is present. Aortic valve peak gradient measures 2.9 mmHg. Pulmonic Valve: The pulmonic valve was normal in structure. Pulmonic valve regurgitation is not visualized. No evidence of pulmonic stenosis. Aorta: The aortic root is normal in size and structure. Venous: The inferior vena cava is normal in size with greater than 50% respiratory variability, suggesting right atrial pressure of 3 mmHg. IAS/Shunts: No atrial level shunt detected by color flow Doppler.  LEFT VENTRICLE PLAX 2D LVIDd:         3.70 cm  Diastology LVIDs:         3.00 cm  LV e' medial:    3.48 cm/s LV PW:         1.30 cm  LV E/e' medial:  61.8 LV IVS:        1.20 cm  LV e' lateral:   4.24 cm/s LVOT diam:     2.00 cm  LV E/e' lateral: 50.7 LV SV:         30 LV SV Index:   21 LVOT Area:     3.14  cm  RIGHT VENTRICLE RV Basal diam:  3.60 cm RV Mid diam:    2.70 cm RV S prime:     10.20 cm/s TAPSE (M-mode): 1.2 cm LEFT ATRIUM              Index       RIGHT ATRIUM           Index LA diam:        4.55 cm  3.23 cm/m  RA Area:     20.70 cm LA Vol (A2C):   105.0 ml 74.43 ml/m RA Volume:   57.60 ml  40.83 ml/m LA Vol (A4C):   67.0 ml  47.49 ml/m LA Biplane Vol: 86.5 ml  61.32 ml/m  AORTIC VALVE  PULMONIC VALVE AV Area (Vmax): 2.12 cm    PV Vmax:          0.58 m/s AV Vmax:        84.70 cm/s  PV Peak grad:     1.4 mmHg AV Peak Grad:   2.9 mmHg    PR End Diast Vel: 6.35 msec LVOT Vmax:      57.05 cm/s  RVOT Peak grad:   1 mmHg LVOT Vmean:     41.600 cm/s LVOT VTI:       0.096 m  AORTA Ao Root diam: 2.90 cm MITRAL VALVE                TRICUSPID VALVE MV Area (PHT): 4.49 cm     TR Peak grad:   56.6 mmHg MV Area VTI:   0.62 cm     TR Vmax:        376.00 cm/s MV Peak grad:  17.1 mmHg MV Mean grad:  6.0 mmHg     SHUNTS MV Vmax:       2.07 m/s     Systemic VTI:  0.10 m MV Vmean:      112.0 cm/s   Systemic Diam: 2.00 cm MV Decel Time: 169 msec MV E velocity: 215.00 cm/s MV A Prime:    3.3 cm/s Isaias Cowman MD Electronically signed by Isaias Cowman MD Signature Date/Time: 11/02/2020/9:18:05 AM    Final    IR PERCUTANEOUS ART THROMBECTOMY/INFUSION INTRACRANIAL INC DIAG ANGIO  Result Date: 11/02/2020 INDICATION: 87 year old female with acute left-sided ICA terminus occlusion, presents for angiogram and mechanical thrombectomy EXAM: ULTRASOUND-GUIDED ACCESS RIGHT COMMON FEMORAL ARTERY CERVICAL AND CEREBRAL ANGIOGRAM MECHANICAL THROMBECTOMY LEFT ICA/MCA SPIN CT ANGIO-SEAL FOR HEMOSTASIS COMPARISON:  CT imaging same day MEDICATIONS: None ANESTHESIA/SEDATION: The anesthesia team was present to provide general endotracheal tube anesthesia and for patient monitoring during the procedure. Intubation was performed in room 2, neuro biplane room. Left radial arterial line was performed by the  anesthesia team. Interventional neuro radiology nursing staff was also present. CONTRAST:  64 cc FLUOROSCOPY TIME:  Fluoroscopy Time: 15 minutes 12 seconds (793.6 mGy). COMPLICATIONS: None TECHNIQUE: Informed written consent was obtained from the patient's family after a thorough discussion of the procedural risks, benefits and alternatives. Specific risks discussed include: Bleeding, infection, contrast reaction, kidney injury/failure, need for further procedure/surgery, arterial injury or dissection, embolization to new territory, intracranial hemorrhage (10-15% risk), neurologic deterioration, cardiopulmonary collapse, death. All questions were addressed. Maximal Sterile Barrier Technique was utilized including during the procedure including caps, mask, sterile gowns, sterile gloves, sterile drape, hand hygiene and skin antiseptic. A timeout was performed prior to the initiation of the procedure. The anesthesia team was present to provide general endotracheal tube anesthesia and for patient monitoring during the procedure. Interventional neuro radiology nursing staff was also present. FINDINGS: Initial Findings: Aortic arch is 3 vessel arch, type 3. Left common carotid artery:  Normal course caliber and contour. Left external carotid artery: Patent with antegrade flow. Left internal carotid artery: Significant accordion appearance of the cervical ICA in the proximal and mid segment, less prominent at the skull base, compatible with fibromuscular dysplasia. There is slow flow through the distal cervical segment, including the horizontal and vertical petrous segment, the lateral segment, cavernous segment and supra ophthalmic segment. There is a meniscus occlusion before the terminus with no flow into either the ACA or the MCA. Left MCA:  No flow into the MCA on the initial. Left ACA:  No flow into  the ACA on the initial. Completion Findings: Left MCA: Complete restoration of flow through the MCA after the second  pass. No significant posterior cerebral artery, with patent anterior choroidal artery. The left temporal lobe perfused by the PCA, with the preoperative CT demonstrating filling through the temporal lobe. TICI 3: Complete perfusion of the territory Flat panel CT: No hemorrhage identified. PROCEDURE: The anesthesia team was present to provide general endotracheal tube anesthesia and for patient monitoring during the procedure. Intubation was performed in negative pressure [place] in neuro IR holding. Interventional neuro radiology nursing staff was also present. Ultrasound survey of the right inguinal region was performed with images stored and sent to PACs. 11 blade scalpel was used to make a small incision. Blunt dissection was performed with US guidance. A micropuncture needle was used access the right common femoral artery under ultrasound. With excellent arterial blood flow returned, an .018 micro wire was passed through the needle, observed to enter the abdominal aorta under fluoroscopy. The needle was removed, and a micropuncture sheath was placed over the wire. The inner dilator and wire were removed, and an 035 wire was advanced under fluoroscopy into the abdominal aorta. The sheath was removed and a 25cm 66F straight vascular sheath was placed. The dilator was removed and the sheath was flushed. Sheath was attached to pressurized and heparinized saline bag for constant forward flow. A David catheter was then advanced over the 035 wire to the proximal descending thoracic aorta. The wire was then removed and the David catheter was double flushed. David catheter was then used to select the origin of the left common carotid artery. A standard Glidewire was then advanced into the cervical ICA. David catheter was then advanced into the distal segment of the cervical ICA. Glidewire was removed, catheter was double flush, and angiogram confirmed location. A rose in wire was then passed into the distal cervical segment  and the David catheter was removed. Coaxial system was then advanced over the [employer] wire. This included a 95cm 087 "Walrus" balloon guide with coaxial 100 cm Davis diagnostic catheter. Once the coaxial system was into the distal cervical segment, the David catheter and the [facility] an wire were removed from the balloon guide with adequate flush at the hub of the balloon guide. Double flush was performed. Formal angiogram was performed. Road map function was used once the occluded vessel was identified. Copious back flush was performed and the balloon catheter was attached to heparinized and pressurized saline bag for forward flow. A second coaxial system was then advanced through the balloon catheter, which included the selected intermediate catheter, microcatheter, and microwire. In this scenario, the set up included a zoom 71 aspiration catheter, a Trevo Provue18 microcatheter, and 014 synchro soft wire. This system was advanced through the balloon guide catheter under the road-map function, with adequate back-flush at the rotating hemostatic valve at that back end of the balloon guide. Microcatheter and the intermediate catheter system were advanced through the carotid siphon to the level of the occlusion. Some manipulation was required around the ophthalmic artery origin, as withdrawal of the zoom catheter was performed with manual rotation to attempt a more suitable configuration at the ophthalmic artery origin. Once the zoom catheter was positioned proximal to the occlusion and beyond the ophthalmic artery, the microwire and microcatheter were removed. Rotating hemostatic valve was removed. The proprietary engine was then attached to the hub of the zoom catheter, with aspiration initiated as the first pass. With blood return confirmed the catheter  was gently advanced into the site of the occlusion and passed into the proximal M1 segment. We observed approximately 60 seconds of aspiration time. Before withdrawing  the aspiration catheter into the balloon guide, the balloon on the tip of the balloon guide was gently inflated under fluoroscopy. We then withdrew the aspiration catheter into the balloon guide confirmed free flow of blood return from the zoom catheter, we withdrew the aspiration catheter and then confirmed free flow of blood at the hub of the balloon guide catheter. Balloon was deflated and then angiogram was performed. This restored flow through the terminus segment into the A1 segment. There was migration of embolus into the proximal M1 segment, with no flow through the proximal M1. We elected a second pass. The same coaxial system was then advanced through the balloon catheter, which included the zoom 71 aspiration catheter, a Trevo Provue18 microcatheter, and 014 synchro soft wire. This system was advanced through the balloon guide catheter under the road-map function, with adequate back-flush at the rotating hemostatic valve at that back end of the balloon guide. The microwire and microcatheter were navigated with a looped microwire configuration through the terminus segment into the MCA. The microwire was passed beyond the site of the occlusion, with the microcatheter gently pushed through over the microwire into the distal M1 segment to the insular region. Zoom catheter was then positioned in a location at the terminus directed towards the MCA. Microwire was gently removed from the microcatheter with adequate flush at the hub. Blood was then aspirated through the hub of the microcatheter, and a gentle contrast injection was performed confirming intraluminal position. A rotating hemostatic valve was then attached to the back end of the microcatheter, and a pressurized and heparinized saline bag was attached to the catheter. 4 x 40 solitaire device was then selected. Back flush was achieved at the rotating hemostatic valve, and then the device was gently advanced through the microcatheter to the distal end.  The retriever was then unsheathed by withdrawing the microcatheter under fluoroscopy. Once the retriever was completely unsheathed, the microcatheter was carefully stripped from the delivery device. Control angiogram was performed from the intermediate catheter. A 3 minute time interval was observed. The balloon at the balloon guide catheter was then inflated under fluoroscopy for proximal flow arrest. Constant aspiration using the proprietary engine was then performed at the zoom 71 catheter, which was slowly advanced until stasis of blood flow was observed. The retriever was then gently and slowly withdrawn with fluoroscopic observation. Once the retriever was "corked" within the tip of the intermediate catheter, both were removed from the system. Free aspiration was confirmed at the hub of the balloon guide catheter, with free blood return confirmed. The balloon was then deflated, and a control angiogram was performed. Restoration of flow was confirmed. Angiogram of the cervical ICA was performed. Balloon guide was then removed. The skin at the puncture site was then cleaned with Chlorhexidine. The 8 French sheath was removed and an 54F angioseal was deployed. Flat panel CT was performed. Patient tolerated the procedure well and remained hemodynamically stable throughout. No complications were encountered and no significant blood loss encountered. IMPRESSION: Status post ultrasound guided access right common femoral artery for left-sided cervical/cerebral angiogram and treatment of terminus/MCA occlusion with 2 passes of aspiration thrombectomy/mechanical thrombectomy, achieving TICI 3 flow. Angio-Seal for hemostasis. Angiogram reveals significant fibromuscular dysplasia (FMD) of the left cervical ICA. Signed, Dulcy Fanny. Dellia Nims, [place] [facility] Radiology PLAN: The  patient will remain intubated. ICU status Target systolic blood pressure of 120-140 Right hip  straight time 4 hours Frequent neurovascular checks Repeat neurologic imaging with CT and/MRI at the discretion of neurology team Electronically Signed   By: Corrie Mckusick D.O.   On: 11/02/2020 08:32   CT ANGIO HEAD NECK W WO CM (CODE STROKE)  Result Date: 11/01/2020 CLINICAL DATA:  Neuro deficit, acute, stroke suspected. Clinical concern for left MCA infarct. EXAM: CT ANGIOGRAPHY HEAD AND NECK TECHNIQUE: Multidetector CT imaging of the head and neck was performed using the standard protocol during bolus administration of intravenous contrast. Multiplanar CT image reconstructions and MIPs were obtained to evaluate the vascular anatomy. Carotid stenosis measurements (when applicable) are obtained utilizing NASCET criteria, using the distal internal carotid diameter as the denominator. CONTRAST:  25m OMNIPAQUE IOHEXOL 350 MG/ML SOLN COMPARISON:  Head CT 03/09/2020. High-resolution chest CT 10/05/2020. FINDINGS: CT HEAD FINDINGS Brain: There is no evidence of an acute infarct, intracranial hemorrhage, mass, midline shift, or extra-axial fluid collection. Mild cerebral atrophy is within normal limits for age. Vascular: Calcified atherosclerosis at the skull base. Hyperdense left ICA terminus/proximal left MCA. Skull: No fracture or suspicious osseous lesion. Sinuses: Clear paranasal sinuses. Persistent moderate right mastoid effusion. Orbits: Bilateral cataract extraction. Review of the MIP images confirms the above findings CTA NECK FINDINGS Aortic arch: Standard 3 vessel aortic arch with mild atherosclerotic plaque. Widely patent arch vessel origins. Right carotid system: Patent with minimal calcified plaque at the carotid bifurcation. No evidence of a significant stenosis or dissection. Beading of the mid to distal cervical ICA. Left carotid system: The common carotid artery and bifurcation are widely patent and there is a normal appearance of the proximal most ICA, however there is progressively diminishing  enhancement of the mid and distal portions of the cervical ICA with only faint enhancement at the skull base. A discrete dissection flap is not visible. Vertebral arteries: Patent and codominant without evidence of stenosis or dissection. Skeleton: Focally advanced cervical disc degeneration at C5-6 where there is grade 1 anterolisthesis. Other neck: No evidence of cervical lymphadenopathy or mass. Upper chest: Small bilateral pleural effusions. Bilateral ground-glass opacities with interlobular septal thickening, increased from the prior chest CT. Borderline enlarged mediastinal lymph nodes, potentially reactive. Review of the MIP images confirms the above findings CTA HEAD FINDINGS Anterior circulation: There is progressively diminishing faint opacification of the petrous and cavernous segments of the left ICA with absent opacification from the level of the anterior genu through the terminus. The left M1 segment is occluded proximally with reconstitution at the level of the MCA bifurcation although reconstituted MCA branches are mildly attenuated diffusely. The left A1 segment reconstitutes just beyond its origin. The right MCA and right ACA are patent without evidence of a significant proximal stenosis. No aneurysm is identified. Posterior circulation: Intracranial vertebral arteries are patent to the basilar. Patent PICA, AICA, and SCA origins are visualized bilaterally. The basilar artery is widely patent. Posterior communicating arteries are diminutive or absent. The PCAs are patent with mild multifocal P2 stenoses more notable on the left than the right. No aneurysm is identified. Venous sinuses: Patent. Anatomic variants: None. Review of the MIP images confirms the above findings IMPRESSION: 1. Occlusion of the intracranial left ICA including the ICA terminus and left MCA and ACA origins. Reconstitution of the left MCA and ACA as above. 2. No visible acute infarct or intracranial hemorrhage. ASPECTS of 10.  3. Small pleural effusions and partially visualized bilateral lung opacities,  nonspecific but could reflect edema or infection. 4.  Aortic Atherosclerosis (ICD10-I70.0). These results were called by telephone at the time of interpretation on 11/01/2020 at 6:40 pm to Dr. Su Monks, who verbally acknowledged these results. Electronically Signed   By: Logan Bores M.D.   On: 11/01/2020 19:01    PHYSICAL EXAM  Temp:  [97.6 F (36.4 C)-99.1 F (37.3 C)] 98 F (36.7 C) (09/19 0800) Pulse Rate:  [65-104] 87 (09/19 0800) Resp:  [14-30] 16 (09/19 0800) BP: (82-152)/(53-112) 95/66 (09/19 0800) SpO2:  [76 %-100 %] 100 % (09/19 0900) Arterial Line BP: (106-173)/(54-94) 138/68 (09/19 0500) FiO2 (%):  [40 %-100 %] 40 % (09/19 0401) Weight:  [43.2 kg] 43.2 kg (09/19 0500)  General - Well nourished, well developed, in no apparent distress.   Ophthalmologic - fundi not visualized due to noncooperation.  Cardiovascular - Regular rhythm and rate.  Mental Status -  Level of arousal and orientation to time, place, and person were intact. Language including expression, naming, repetition, comprehension was assessed and found intact. Attention span and concentration were normal. Recent and remote memory were intact. Fund of Knowledge was assessed and was intact.  Cranial Nerves II - XII - II - Visual field intact OU. III, IV, VI - Extraocular movements intact. V - Facial sensation intact bilaterally. VII - Facial movement intact bilaterally. VIII - Hearing & vestibular intact bilaterally. X - Palate elevates symmetrically. XI - Chin turning & shoulder shrug intact bilaterally. XII - Tongue protrusion intact.  Motor Strength - Antigravity x 4 extremities  Motor Tone - Muscle tone was assessed at the neck and appendages and was normal.  Sensory - Light touch, temperature/pinprick were assessed and were symmetrical.    Coordination - The patient had normal movements in the hands and feet with no  ataxia or dysmetria. Finger to nose slow,  impaired RUE + RLE drift,  Gait and Station - deferred.   ASSESSMENT/PLAN Patient with hx HTN and a fib on xarelto admitted for a fib w/ RVR, acute on chronic CHF exacerbation and possible LLL PNA. Inpatient stroke code was called for acute onset global aphasia and R hemiplegia. LKW 1800. CTH NAICP, ASPECTS 10. Not a TNK candidate 2/2 on xarelto. CTA showed L ICA occlusion with reconstitution and L M1 cutoff. IR was emergently consulted. She was taken to IR by Dr. Earleen Newport for thrombectomy.   Stroke:  Small left punctate anterior CR stroke with L ICA and MCA occlusion s/p IR with TICI 3, likely due to afib RVR. CT head No visible acute infarct or intracranial hemorrhage. ASPECTS of 10. Code Stroke CTA head & neck Occlusion of the intracranial left ICA including the ICA terminusand left MCA and ACA origins. Reconstitution of the left MCA and ACA.  MRI brain Tiny acute infarcts at the left basal ganglia. MRA head the left MCA remains patent. Significant motion artifact. 2D Echo EF 40-45% Left atrial size was moderately dilated. Right atrial size was  LDL 81 HgbA1c 5.8 VTE prophylaxis - restarting Xarelto this evening ON Xarelto '15mg'$  daily, no antiplatelet prior to admission.  Restarting Xarelto this evening Therapy recommendations:  Pending Disposition:  TBD  A fib with RVR on admission On Xarelto prior to admission, consider restarting this evening pending swallow status On cardizem and amiodarone at home  Restarted Cardizem Amiodarone on hold due to QT prolongation  Hypertension Home meds: Cardizem Stable BP goal 120-140 post IR 24h Long-term BP goal normotensive  LLL CAP Diagnosed prior to this admission CCM planning to continue ceftriaxone for 5 days total azithro d/c'd by hospitalist due to prolonged qtc Monitoring fever and WBC curve       CHF upon admission  BNP 1073 in ED, Diuresed in ED, on lasix prior to stroke  episode Monitor I/O Stable so far  Hyperlipidemia Home meds: None LDL 81, goal < 70  History of statin allergy with severe myalgias Put on Zetia 10 Continue Zetia at discharge  Other Stroke Risk Factors Advanced Age >/= 73   Hx of PE  Family hx stroke (Sister)  Other Active Problems Hypothyroidism Anxiety (home regimen is Xanax prn for anxiety or sleep)   Hospital day # 1  This patient was seen and evaluated with Dr. Erlinda Hong.  Plan of care was directed by Dr. Erlinda Hong.  Charlene Brooke, NP-C  ATTENDING NOTE: I reviewed above note and agree with the assessment and plan. Pt was seen and examined.   87 year old female with history of A. fib on Xarelto, PE, hypertension admitted for shortness of breath, palpitation, found to have A. fib RVR with possible pneumonia.  Treated with Lasix, Cardizem, Rocephin and azithromycin.  However developed acute onset right-sided weakness and aphasia.  CT no acute abnormality.  CTA head and neck left ICA occlusion, proximal left MCA and ACA occlusion with distal reconstitution.  Mechanical thrombectomy performed achieving TICI3 reperfusion.  MRI showed tiny left anterior CR infarct.  MRA left ICA and MCA patent.  EF 40 to 45%.  A1c 5.8, LDL 81.  Creatinine 0.85.  On exam, patient lethargic, just post extubation, however awake alert, eyes open, orientated to place, self and year but confused with months.  Moderate dysarthria due to poor denture, no aphasia, able to repeat and name, follows simple commands.  No gaze palsy, visual field fall.  Mild right facial droop.  Right upper extremity mild pronator drift, finger grip 4/5.  Right lower extremity proximal and knee flexion 4/5, left upper and lower extremity at least 4+/5.  Finger-to-nose bilaterally intact however left slow in action.  Sensation symmetrical.  Etiology for patient stroke likely due to A. fib RVR.  Currently rate controlled.  Has a swallow, will resume Cardizem and Xarelto.  Amiodarone on hold  due to QT prolongation.  BP goal 120-140 after 24 hours of IR. Continue Rocephin for possible pneumonia.  Patient has history of statin allergy due to severe myalgia, will put on Zetia. PT/OT recommend CIR.  For detailed assessment and plan, please refer to above as I have made changes wherever appropriate.   Rosalin Hawking, MD PhD Stroke Neurology 11/02/2020 4:04 PM  This patient is critically ill due to Left MCA stroke, status post mechanical thrombectomy, A. fib RVR, pneumonia QT prolongation and at significant risk of neurological worsening, death form Recurrent stroke, hemorrhagic conversion, heart failure, sepsis, arrhythmia. This patient's care requires constant monitoring of vital signs, hemodynamics, respiratory and cardiac monitoring, review of multiple databases, neurological assessment, discussion with family, other specialists and medical decision making of high complexity. I spent 45 minutes of neurocritical care time in the care of this patient.    To contact Stroke Continuity provider, please refer to [email or web address] After hours, contact General Neurology

## 2020-11-02 NOTE — Progress Notes (Signed)
NAME:  Frances Kent, MRN:  [id number], DOB:  [birth year], LOS: 1 ADMISSION DATE:  10/30/2020, CONSULTATION DATE:  9/18 REFERRING MD:  Dr. Curly Shores, REASON FOR CONSULT:  Vent management and cardiac management   History of Present Illness:  Patient is encephalopathic and/or intubated. Therefore history has been obtained from chart review.   Frances Kent is a 87 y.o. female who is seen in consultation at the request of Dr. Curly Shores for recommendations on further evaluation and management of the ventilator and cardiac management.    Frances Kent is an 87 y.o. F with a pertinent PMX of Afib/svt on xarelto, PE, HTN, hypothyroidism, anxiety, GERD.  She was admitted on 9/16 for acute on chronic CHF exacerbation, LLL pneumonia and A.fib with RVR. Cardiology was consulted. She was treated with a cardizem gtt, IV lasix, ceftriaxone, and azithromycin.  She had a brief period of hypotension on 9/17-9/18 which was believed to be iatrogenic. A code stroke was called the evening of 9/18. LKW was 1800. The patient developed global aphasia and R hemipelegia. Xarelto was given on 9/18, not a TNK candidate. CTA Head and neck showed a L ICA occlusion with reconstitution and a L M1 cutoff. NIHSS 20. She was transferred from [facility] to [facility] for ICU needs  She was taken to [facility] A mechanical thrombectomy was preformed of the L ICA, TICI 3 with two passes.  She remains critically ill in the 4N ICU  Pertinent  Medical History  Afib/svt on xarelto, PE, HTN, hypothyroidism, anxiety, GERD.  Significant Hospital Events: Including procedures, antibiotic start and stop dates in addition to other pertinent events   9/16 Admit to [facility]. Ceftriaxone. Cards consult 9/18 Code stroke, txr to [facility],  mechanical thrombectomy was preformed of the L ICA, TICI 3 with two passes.  Interim History / Subjective:  On 15 propofol and awake following commands.  Nods head to questions  Objective   Blood pressure 138/72, pulse  88, temperature 99.1 F (37.3 C), temperature source Axillary, resp. rate 14, weight 43.2 kg, SpO2 (!) 76 %.    Vent Mode: PRVC FiO2 (%):  [40 %-100 %] 40 % Set Rate:  [14 bmp] 14 bmp Vt Set:  [420 mL] 420 mL PEEP:  [5 cmH20] 5 cmH20 Plateau Pressure:  [17 cmH20-18 cmH20] 18 cmH20   Intake/Output Summary (Last 24 hours) at 11/02/2020 0741 Last data filed at 11/02/2020 0200 Gross per 24 hour  Intake 294.08 ml  Output 50 ml  Net 244.08 ml    Filed Weights   11/02/20 0500  Weight: 43.2 kg    Examination: General: Elderly female, resting in bed, in NAD. Neuro: Opens eyes, nods to questions, follows basic commands. Moving both upper extremities > lowers. HEENT: [place]/AT. Sclerae anicteric. ETT in place. Cardiovascular: RRR, no M/R/G.  Lungs: Respirations even and unlabored.  CTA bilaterally, No W/R/R.  Abdomen: BS x 4, soft, NT/ND.  Musculoskeletal: No gross deformities, no edema.  Skin: Intact, warm, no rashes.   Assessment & Plan:   Acute L ICA and L M1 occlusion - s/p NIR, mechanical thrombectomy of the L ICA, TICI 3 with two passes. -Management and work up per neurology/NIR/primary -Cleviprex PRN for goal blood pressure 120-140 -ASA/Statin per primary -Follow up echo, MRI  Mechanical ventilation, s/p intubation for procedure - 2/2 above -SBT this AM for probable extubation -Bronchial hygiene -SLP eval once extubated  Acute on chronic systolic CHF Permanent A.fib with RVR Prolonged QT Was on xarelto for ac. Afib on  monitor, no RVR. Trop 10>9. BNP 418. MG 2.1, K 4.7. QTC 588 on 9/18 -BP goals/cleviprex as discussed -Follow up ECHO -Nursing unable to obtain enteral access. If extubated this AM, plan to restart PO amio and metoprolol -holding AC at this time in the setting of stroke and IR intervention -Consider cardiology consult -Limit QTC prolonging drugs -Goal K above 4, goal MG above 2  Community aquired PNA, POA Left lower lobe, Lactate 1.2, PCT less than .10,  WBC 7.8 -Continue ceftriaxone for 5 days total, azithro d/c'd by hospitalist due to prolonged qtc -Follow fever WBC curve  HX GERD -PPI  HX PE -Holding AC as discussed  Anxiety -holding home xanax  Hypothyroidism -Resume synthroid when able to take PO  Best Practice (right click and "Reselect all SmartList Selections" daily)   Diet/type: NPO w/ meds via tube DVT prophylaxis: SCD GI prophylaxis: PPI Lines: N/A Foley:  N/A Code Status:  full code Last date of multidisciplinary goals of care discussion [pending]  Critical care time: 30 minutes    Montey Hora, PA - C [facility] For pager details, please see AMION or use Epic chat  After 1900, please call [facility] for cross coverage needs 11/02/2020, 8:07 AM

## 2020-11-02 NOTE — Plan of Care (Signed)
Examination reassuring post procedure and clearance of paralytic/sedation.  Able to follow some commands with the right upper extremity such as showing me a thumbs up, shows me her whole hand when I asked to see 2 fingers.  Localizes to ET tube with bilateral upper extremities.  Able to move bilateral lower extremities approximately 2/5  Lesleigh Noe, MD-PhD [facility] [phone] Available 7 PM to 7 AM, outside of these hours please call Neurologist on call as listed on Amion.

## 2020-11-02 NOTE — Progress Notes (Signed)
PT Cancellation Note  Patient Details Name: Frances Kent MRN: [id number] DOB: 02-07-1931   Cancelled Treatment:    Reason Eval/Treat Not Completed: Other (comment). Pt being extubated this morning. PT to return as able to complete PT eval.  Kittie Plater, PT, DPT Acute Rehabilitation Services Pager #: [phone] Office #: [phone]    Berline Lopes 11/02/2020, 1:54 PM

## 2020-11-02 NOTE — Progress Notes (Signed)
OT Note  Pt seen for evaluation s/p thrombectomy. Full report to follow. Recommend CIR for rehab. Pt previously independent with ADL and mobility. Maurie Boettcher, OT/L   Acute OT Clinical Specialist Acute Rehabilitation Services Pager [phone] Office [phone]

## 2020-11-02 NOTE — Anesthesia Postprocedure Evaluation (Signed)
Anesthesia Post Note  Patient: Frances Kent  Procedure(s) Performed: RADIOLOGY WITH ANESTHESIA     Patient location during evaluation: ICU Anesthesia Type: General Level of consciousness: patient remains intubated per anesthesia plan Pain management: pain level controlled Vital Signs Assessment: post-procedure vital signs reviewed and stable Respiratory status: patient remains intubated per anesthesia plan Cardiovascular status: stable Postop Assessment: no apparent nausea or vomiting Anesthetic complications: no   No notable events documented.  Last Vitals:  Vitals:   11/01/20 2341 11/02/20 0000  BP: 137/66 (!) 86/64  Pulse: 93 (!) 104  Resp: (!) 30 19  Temp:  36.5 C  SpO2: 97% 93%    Last Pain:  Vitals:   11/01/20 2138  TempSrc: Oral                 Renata Gambino

## 2020-11-02 NOTE — Progress Notes (Signed)
Referring Physician(s): Dr Curly Shores  Supervising Physician: Corrie Mckusick  Patient Status:  [facility] - In-pt  Chief Complaint:  Code stroke Procedure: 9/18 am US guided right CFA access Cervical & Cerebral Angiogram Mechanical Thrombectomy x 2 Deployment of Angioseal  Subjective:  Left ICA terminus TICI 0 First Pass Device:                 Zoom 71 aspiration             Result                         TICI 3   Left MCA/M1 TICI 0 2nd Pass Device:                  Zoom 71 + Solitaire 4x40             Result                         TICI 3   Findings:                                TICI 3 final                                                 Flat panel CT: no hemorrhage                                                 FMD left ICA  Pt is doing quite well Moving all 4s Follows commands About to be extubated    Allergies: Statins  Medications: Prior to Admission medications   Medication Sig Start Date End Date Taking? Authorizing Provider  acetaminophen (TYLENOL) 500 MG tablet Take 500 mg by mouth every 6 (six) hours as needed.    [provider]  ALPRAZolam Duanne Moron) 0.25 MG tablet Take 1 tablet (0.25 mg total) by mouth at bedtime as needed for anxiety or sleep. 08/26/20   [name], MD  amiodarone (PACERONE) 200 MG tablet Take 200 mg by mouth daily.    [provider]  benzonatate (TESSALON) 200 MG capsule Take 1 capsule (200 mg total) by mouth 3 (three) times daily as needed for cough. 09/22/20   [name], MD  cefTRIAXone 2 g in sodium chloride 0.9 % 100 mL Inject 2 g into the vein daily for 2 days. 11/02/20 11/04/20  Mercy Riding, MD  chlorpheniramine-HYDROcodone (TUSSIONEX) 10-8 MG/5ML SUER Take 5 mLs by mouth every 12 (twelve) hours as needed for cough. 10/16/20   Florencia Reasons, MD  diltiazem (CARDIZEM) 30 MG tablet Take 1 tablet (30 mg total) by mouth every 8 (eight) hours. Patient taking differently: Take 30 mg by mouth 2 (two) times daily. 10/16/20    Florencia Reasons, MD  esomeprazole (NEXIUM) 40 MG capsule Take 1 capsule (40 mg total) by mouth daily. 09/22/20   [name], MD  ipratropium (ATROVENT) 0.02 % nebulizer solution Take 2.5 mLs (0.5 mg total) by nebulization every 4 (four) hours as needed for wheezing or shortness of breath (cough). 10/16/20  Florencia Reasons, MD  levothyroxine (EUTHYROX) 50 MCG tablet TAKE 1 TABLET BY MOUTH ONCE DAILY 30  MINUTES  PRIOR  TO  EATING; 09/11/20   [name], MD  potassium chloride SA (KLOR-CON) 20 MEQ tablet Take 20 mEq by mouth daily. 10/26/20   [provider]  XARELTO 15 MG TABS tablet TAKE 1 TABLET EVERY DAY 08/13/20   [name], MD     Vital Signs: BP 95/66   Pulse 87   Temp 98 F (36.7 C) (Axillary)   Resp 16   Wt 95 lb 3.8 oz (43.2 kg)   SpO2 98%   BMI 16.87 kg/m   Physical Exam Vitals reviewed.  Constitutional:      Comments: Intubates Opens eyes- follows me Tries to communicate Face symmetrical  HENT:     Mouth/Throat:     Mouth: Mucous membranes are moist.  Eyes:     Extraocular Movements: Extraocular movements intact.  Cardiovascular:     Rate and Rhythm: Normal rate.  Pulmonary:     Breath sounds: Normal breath sounds.  Musculoskeletal:     Comments: Moves all 4s Follows commands Good strength= Rt groin site is clean and dry NT no bleeding No hematoma   Skin:    General: Skin is warm.  Neurological:     Mental Status: She is alert.    Imaging: DG Chest 2 View  Result Date: 10/31/2020 CLINICAL DATA:  Shortness of breath, weakness EXAM: CHEST - 2 VIEW COMPARISON:  10/30/2020 FINDINGS: No significant change in chest radiographs with gross cardiomegaly and a small left pleural effusion with associated atelectasis or consolidation. No new airspace opacity. Disc degenerative disease of the thoracic spine. IMPRESSION: No significant change in chest radiographs with gross cardiomegaly and a small left pleural effusion with associated atelectasis or  consolidation. No new airspace opacity. Electronically Signed   By: Eddie Candle M.D.   On: 10/31/2020 11:30   MR ANGIO HEAD WO CONTRAST  Result Date: 11/02/2020 CLINICAL DATA:  Stroke follow-up EXAM: MRI HEAD WITHOUT CONTRAST MRA HEAD WITHOUT CONTRAST TECHNIQUE: Multiplanar, multi-echo pulse sequences of the brain and surrounding structures were acquired without intravenous contrast. Angiographic images of the Circle of Willis were acquired using MRA technique without intravenous contrast. COMPARISON:  CTA from yesterday FINDINGS: MRI HEAD FINDINGS Brain: Tiny acute infarcts at the left basal ganglia. No acute hemorrhage, hydrocephalus, collection, or masslike finding age normal brain volume Vascular: See below Skull and upper cervical spine: Normal marrow signal Sinuses/Orbits: Partial right mastoid opacification with negative nasopharynx. Bilateral cataract resection. MRA HEAD FINDINGS Anterior circulation: Patent carotid, M1, A1 segments. No evidence of branch occlusion Posterior circulation: Vertebral, basilar, and proximal posterior cerebral arteries are patent without evidence of branch occlusion. Anatomic variants: None significant Extensive motion artifact which could easily obscure pathology. IMPRESSION: 1. Tiny acute infarcts at the left basal ganglia. 2. The left MCA remains patent. 3. Significant motion artifact. Electronically Signed   By: Jorje Guild M.D.   On: 11/02/2020 06:49   MR BRAIN WO CONTRAST  Result Date: 11/02/2020 CLINICAL DATA:  Stroke follow-up EXAM: MRI HEAD WITHOUT CONTRAST MRA HEAD WITHOUT CONTRAST TECHNIQUE: Multiplanar, multi-echo pulse sequences of the brain and surrounding structures were acquired without intravenous contrast. Angiographic images of the Circle of Willis were acquired using MRA technique without intravenous contrast. COMPARISON:  CTA from yesterday FINDINGS: MRI HEAD FINDINGS Brain: Tiny acute infarcts at the left basal ganglia. No acute hemorrhage,  hydrocephalus, collection, or masslike finding age normal  brain volume Vascular: See below Skull and upper cervical spine: Normal marrow signal Sinuses/Orbits: Partial right mastoid opacification with negative nasopharynx. Bilateral cataract resection. MRA HEAD FINDINGS Anterior circulation: Patent carotid, M1, A1 segments. No evidence of branch occlusion Posterior circulation: Vertebral, basilar, and proximal posterior cerebral arteries are patent without evidence of branch occlusion. Anatomic variants: None significant Extensive motion artifact which could easily obscure pathology. IMPRESSION: 1. Tiny acute infarcts at the left basal ganglia. 2. The left MCA remains patent. 3. Significant motion artifact. Electronically Signed   By: Jorje Guild M.D.   On: 11/02/2020 06:49   IR CT Head Ltd  Result Date: 11/02/2020 INDICATION: 87 year old female with acute left-sided ICA terminus occlusion, presents for angiogram and mechanical thrombectomy EXAM: ULTRASOUND-GUIDED ACCESS RIGHT COMMON FEMORAL ARTERY CERVICAL AND CEREBRAL ANGIOGRAM MECHANICAL THROMBECTOMY LEFT ICA/MCA SPIN CT ANGIO-SEAL FOR HEMOSTASIS COMPARISON:  CT imaging same day MEDICATIONS: None ANESTHESIA/SEDATION: The anesthesia team was present to provide general endotracheal tube anesthesia and for patient monitoring during the procedure. Intubation was performed in room 2, neuro biplane room. Left radial arterial line was performed by the anesthesia team. Interventional neuro radiology nursing staff was also present. CONTRAST:  64 cc FLUOROSCOPY TIME:  Fluoroscopy Time: 15 minutes 12 seconds (793.6 mGy). COMPLICATIONS: None TECHNIQUE: Informed written consent was obtained from the patient's family after a thorough discussion of the procedural risks, benefits and alternatives. Specific risks discussed include: Bleeding, infection, contrast reaction, kidney injury/failure, need for further procedure/surgery, arterial injury or dissection, embolization  to new territory, intracranial hemorrhage (10-15% risk), neurologic deterioration, cardiopulmonary collapse, death. All questions were addressed. Maximal Sterile Barrier Technique was utilized including during the procedure including caps, mask, sterile gowns, sterile gloves, sterile drape, hand hygiene and skin antiseptic. A timeout was performed prior to the initiation of the procedure. The anesthesia team was present to provide general endotracheal tube anesthesia and for patient monitoring during the procedure. Interventional neuro radiology nursing staff was also present. FINDINGS: Initial Findings: Aortic arch is 3 vessel arch, type 3. Left common carotid artery:  Normal course caliber and contour. Left external carotid artery: Patent with antegrade flow. Left internal carotid artery: Significant accordion appearance of the cervical ICA in the proximal and mid segment, less prominent at the skull base, compatible with fibromuscular dysplasia. There is slow flow through the distal cervical segment, including the horizontal and vertical petrous segment, the lateral segment, cavernous segment and supra ophthalmic segment. There is a meniscus occlusion before the terminus with no flow into either the ACA or the MCA. Left MCA:  No flow into the MCA on the initial. Left ACA:  No flow into the ACA on the initial. Completion Findings: Left MCA: Complete restoration of flow through the MCA after the second pass. No significant posterior cerebral artery, with patent anterior choroidal artery. The left temporal lobe perfused by the PCA, with the preoperative CT demonstrating filling through the temporal lobe. TICI 3: Complete perfusion of the territory Flat panel CT: No hemorrhage identified. PROCEDURE: The anesthesia team was present to provide general endotracheal tube anesthesia and for patient monitoring during the procedure. Intubation was performed in negative pressure [place] in neuro IR holding. Interventional neuro  radiology nursing staff was also present. Ultrasound survey of the right inguinal region was performed with images stored and sent to PACs. 11 blade scalpel was used to make a small incision. Blunt dissection was performed with US guidance. A micropuncture needle was used access the right common femoral artery under ultrasound. With excellent arterial  blood flow returned, an .018 micro wire was passed through the needle, observed to enter the abdominal aorta under fluoroscopy. The needle was removed, and a micropuncture sheath was placed over the wire. The inner dilator and wire were removed, and an 035 wire was advanced under fluoroscopy into the abdominal aorta. The sheath was removed and a 25cm 39F straight vascular sheath was placed. The dilator was removed and the sheath was flushed. Sheath was attached to pressurized and heparinized saline bag for constant forward flow. A David catheter was then advanced over the 035 wire to the proximal descending thoracic aorta. The wire was then removed and the David catheter was double flushed. David catheter was then used to select the origin of the left common carotid artery. A standard Glidewire was then advanced into the cervical ICA. David catheter was then advanced into the distal segment of the cervical ICA. Glidewire was removed, catheter was double flush, and angiogram confirmed location. A rose in wire was then passed into the distal cervical segment and the David catheter was removed. Coaxial system was then advanced over the [employer] wire. This included a 95cm 087 "Walrus" balloon guide with coaxial 100 cm Davis diagnostic catheter. Once the coaxial system was into the distal cervical segment, the David catheter and the [facility] an wire were removed from the balloon guide with adequate flush at the hub of the balloon guide. Double flush was performed. Formal angiogram was performed. Road map function was used once the occluded vessel was identified. Copious back flush  was performed and the balloon catheter was attached to heparinized and pressurized saline bag for forward flow. A second coaxial system was then advanced through the balloon catheter, which included the selected intermediate catheter, microcatheter, and microwire. In this scenario, the set up included a zoom 71 aspiration catheter, a Trevo Provue18 microcatheter, and 014 synchro soft wire. This system was advanced through the balloon guide catheter under the road-map function, with adequate back-flush at the rotating hemostatic valve at that back end of the balloon guide. Microcatheter and the intermediate catheter system were advanced through the carotid siphon to the level of the occlusion. Some manipulation was required around the ophthalmic artery origin, as withdrawal of the zoom catheter was performed with manual rotation to attempt a more suitable configuration at the ophthalmic artery origin. Once the zoom catheter was positioned proximal to the occlusion and beyond the ophthalmic artery, the microwire and microcatheter were removed. Rotating hemostatic valve was removed. The proprietary engine was then attached to the hub of the zoom catheter, with aspiration initiated as the first pass. With blood return confirmed the catheter was gently advanced into the site of the occlusion and passed into the proximal M1 segment. We observed approximately 60 seconds of aspiration time. Before withdrawing the aspiration catheter into the balloon guide, the balloon on the tip of the balloon guide was gently inflated under fluoroscopy. We then withdrew the aspiration catheter into the balloon guide confirmed free flow of blood return from the zoom catheter, we withdrew the aspiration catheter and then confirmed free flow of blood at the hub of the balloon guide catheter. Balloon was deflated and then angiogram was performed. This restored flow through the terminus segment into the A1 segment. There was migration of  embolus into the proximal M1 segment, with no flow through the proximal M1. We elected a second pass. The same coaxial system was then advanced through the balloon catheter, which included the zoom 71 aspiration catheter, a Trevo  Provue18 microcatheter, and 014 synchro soft wire. This system was advanced through the balloon guide catheter under the road-map function, with adequate back-flush at the rotating hemostatic valve at that back end of the balloon guide. The microwire and microcatheter were navigated with a looped microwire configuration through the terminus segment into the MCA. The microwire was passed beyond the site of the occlusion, with the microcatheter gently pushed through over the microwire into the distal M1 segment to the insular region. Zoom catheter was then positioned in a location at the terminus directed towards the MCA. Microwire was gently removed from the microcatheter with adequate flush at the hub. Blood was then aspirated through the hub of the microcatheter, and a gentle contrast injection was performed confirming intraluminal position. A rotating hemostatic valve was then attached to the back end of the microcatheter, and a pressurized and heparinized saline bag was attached to the catheter. 4 x 40 solitaire device was then selected. Back flush was achieved at the rotating hemostatic valve, and then the device was gently advanced through the microcatheter to the distal end. The retriever was then unsheathed by withdrawing the microcatheter under fluoroscopy. Once the retriever was completely unsheathed, the microcatheter was carefully stripped from the delivery device. Control angiogram was performed from the intermediate catheter. A 3 minute time interval was observed. The balloon at the balloon guide catheter was then inflated under fluoroscopy for proximal flow arrest. Constant aspiration using the proprietary engine was then performed at the zoom 71 catheter, which was slowly  advanced until stasis of blood flow was observed. The retriever was then gently and slowly withdrawn with fluoroscopic observation. Once the retriever was "corked" within the tip of the intermediate catheter, both were removed from the system. Free aspiration was confirmed at the hub of the balloon guide catheter, with free blood return confirmed. The balloon was then deflated, and a control angiogram was performed. Restoration of flow was confirmed. Angiogram of the cervical ICA was performed. Balloon guide was then removed. The skin at the puncture site was then cleaned with Chlorhexidine. The 8 French sheath was removed and an 74F angioseal was deployed. Flat panel CT was performed. Patient tolerated the procedure well and remained hemodynamically stable throughout. No complications were encountered and no significant blood loss encountered. IMPRESSION: Status post ultrasound guided access right common femoral artery for left-sided cervical/cerebral angiogram and treatment of terminus/MCA occlusion with 2 passes of aspiration thrombectomy/mechanical thrombectomy, achieving TICI 3 flow. Angio-Seal for hemostasis. Angiogram reveals significant fibromuscular dysplasia (FMD) of the left cervical ICA. Signed, Dulcy Fanny. Dellia Nims, [facility] Radiology PLAN: The patient will remain intubated. ICU status Target systolic blood pressure of 120-140 Right hip straight time 4 hours Frequent neurovascular checks Repeat neurologic imaging with CT and/MRI at the discretion of neurology team Electronically Signed   By: Corrie Mckusick D.O.   On: 11/02/2020 08:32   IR US Guide Vasc Access Right  Result Date: 11/02/2020 INDICATION: 87 year old female with acute left-sided ICA terminus occlusion, presents for angiogram and mechanical thrombectomy EXAM: ULTRASOUND-GUIDED ACCESS RIGHT COMMON FEMORAL ARTERY CERVICAL AND CEREBRAL ANGIOGRAM MECHANICAL THROMBECTOMY LEFT ICA/MCA SPIN  CT ANGIO-SEAL FOR HEMOSTASIS COMPARISON:  CT imaging same day MEDICATIONS: None ANESTHESIA/SEDATION: The anesthesia team was present to provide general endotracheal tube anesthesia and for patient monitoring during the procedure. Intubation was performed in room 2, neuro biplane room. Left radial arterial line was performed by the anesthesia team. Interventional neuro radiology nursing staff was also present.  CONTRAST:  64 cc FLUOROSCOPY TIME:  Fluoroscopy Time: 15 minutes 12 seconds (793.6 mGy). COMPLICATIONS: None TECHNIQUE: Informed written consent was obtained from the patient's family after a thorough discussion of the procedural risks, benefits and alternatives. Specific risks discussed include: Bleeding, infection, contrast reaction, kidney injury/failure, need for further procedure/surgery, arterial injury or dissection, embolization to new territory, intracranial hemorrhage (10-15% risk), neurologic deterioration, cardiopulmonary collapse, death. All questions were addressed. Maximal Sterile Barrier Technique was utilized including during the procedure including caps, mask, sterile gowns, sterile gloves, sterile drape, hand hygiene and skin antiseptic. A timeout was performed prior to the initiation of the procedure. The anesthesia team was present to provide general endotracheal tube anesthesia and for patient monitoring during the procedure. Interventional neuro radiology nursing staff was also present. FINDINGS: Initial Findings: Aortic arch is 3 vessel arch, type 3. Left common carotid artery:  Normal course caliber and contour. Left external carotid artery: Patent with antegrade flow. Left internal carotid artery: Significant accordion appearance of the cervical ICA in the proximal and mid segment, less prominent at the skull base, compatible with fibromuscular dysplasia. There is slow flow through the distal cervical segment, including the horizontal and vertical petrous segment, the lateral segment,  cavernous segment and supra ophthalmic segment. There is a meniscus occlusion before the terminus with no flow into either the ACA or the MCA. Left MCA:  No flow into the MCA on the initial. Left ACA:  No flow into the ACA on the initial. Completion Findings: Left MCA: Complete restoration of flow through the MCA after the second pass. No significant posterior cerebral artery, with patent anterior choroidal artery. The left temporal lobe perfused by the PCA, with the preoperative CT demonstrating filling through the temporal lobe. TICI 3: Complete perfusion of the territory Flat panel CT: No hemorrhage identified. PROCEDURE: The anesthesia team was present to provide general endotracheal tube anesthesia and for patient monitoring during the procedure. Intubation was performed in negative pressure [place] in neuro IR holding. Interventional neuro radiology nursing staff was also present. Ultrasound survey of the right inguinal region was performed with images stored and sent to PACs. 11 blade scalpel was used to make a small incision. Blunt dissection was performed with US guidance. A micropuncture needle was used access the right common femoral artery under ultrasound. With excellent arterial blood flow returned, an .018 micro wire was passed through the needle, observed to enter the abdominal aorta under fluoroscopy. The needle was removed, and a micropuncture sheath was placed over the wire. The inner dilator and wire were removed, and an 035 wire was advanced under fluoroscopy into the abdominal aorta. The sheath was removed and a 25cm 22F straight vascular sheath was placed. The dilator was removed and the sheath was flushed. Sheath was attached to pressurized and heparinized saline bag for constant forward flow. A David catheter was then advanced over the 035 wire to the proximal descending thoracic aorta. The wire was then removed and the David catheter was double flushed. David catheter was then used to select the  origin of the left common carotid artery. A standard Glidewire was then advanced into the cervical ICA. David catheter was then advanced into the distal segment of the cervical ICA. Glidewire was removed, catheter was double flush, and angiogram confirmed location. A rose in wire was then passed into the distal cervical segment and the David catheter was removed. Coaxial system was then advanced over the [employer] wire. This included a 95cm 087 "Walrus" balloon guide with  coaxial 100 cm Davis diagnostic catheter. Once the coaxial system was into the distal cervical segment, the David catheter and the [facility] an wire were removed from the balloon guide with adequate flush at the hub of the balloon guide. Double flush was performed. Formal angiogram was performed. Road map function was used once the occluded vessel was identified. Copious back flush was performed and the balloon catheter was attached to heparinized and pressurized saline bag for forward flow. A second coaxial system was then advanced through the balloon catheter, which included the selected intermediate catheter, microcatheter, and microwire. In this scenario, the set up included a zoom 71 aspiration catheter, a Trevo Provue18 microcatheter, and 014 synchro soft wire. This system was advanced through the balloon guide catheter under the road-map function, with adequate back-flush at the rotating hemostatic valve at that back end of the balloon guide. Microcatheter and the intermediate catheter system were advanced through the carotid siphon to the level of the occlusion. Some manipulation was required around the ophthalmic artery origin, as withdrawal of the zoom catheter was performed with manual rotation to attempt a more suitable configuration at the ophthalmic artery origin. Once the zoom catheter was positioned proximal to the occlusion and beyond the ophthalmic artery, the microwire and microcatheter were removed. Rotating hemostatic valve was removed.  The proprietary engine was then attached to the hub of the zoom catheter, with aspiration initiated as the first pass. With blood return confirmed the catheter was gently advanced into the site of the occlusion and passed into the proximal M1 segment. We observed approximately 60 seconds of aspiration time. Before withdrawing the aspiration catheter into the balloon guide, the balloon on the tip of the balloon guide was gently inflated under fluoroscopy. We then withdrew the aspiration catheter into the balloon guide confirmed free flow of blood return from the zoom catheter, we withdrew the aspiration catheter and then confirmed free flow of blood at the hub of the balloon guide catheter. Balloon was deflated and then angiogram was performed. This restored flow through the terminus segment into the A1 segment. There was migration of embolus into the proximal M1 segment, with no flow through the proximal M1. We elected a second pass. The same coaxial system was then advanced through the balloon catheter, which included the zoom 71 aspiration catheter, a Trevo Provue18 microcatheter, and 014 synchro soft wire. This system was advanced through the balloon guide catheter under the road-map function, with adequate back-flush at the rotating hemostatic valve at that back end of the balloon guide. The microwire and microcatheter were navigated with a looped microwire configuration through the terminus segment into the MCA. The microwire was passed beyond the site of the occlusion, with the microcatheter gently pushed through over the microwire into the distal M1 segment to the insular region. Zoom catheter was then positioned in a location at the terminus directed towards the MCA. Microwire was gently removed from the microcatheter with adequate flush at the hub. Blood was then aspirated through the hub of the microcatheter, and a gentle contrast injection was performed confirming intraluminal position. A rotating  hemostatic valve was then attached to the back end of the microcatheter, and a pressurized and heparinized saline bag was attached to the catheter. 4 x 40 solitaire device was then selected. Back flush was achieved at the rotating hemostatic valve, and then the device was gently advanced through the microcatheter to the distal end. The retriever was then unsheathed by withdrawing the microcatheter under fluoroscopy. Once the  retriever was completely unsheathed, the microcatheter was carefully stripped from the delivery device. Control angiogram was performed from the intermediate catheter. A 3 minute time interval was observed. The balloon at the balloon guide catheter was then inflated under fluoroscopy for proximal flow arrest. Constant aspiration using the proprietary engine was then performed at the zoom 71 catheter, which was slowly advanced until stasis of blood flow was observed. The retriever was then gently and slowly withdrawn with fluoroscopic observation. Once the retriever was "corked" within the tip of the intermediate catheter, both were removed from the system. Free aspiration was confirmed at the hub of the balloon guide catheter, with free blood return confirmed. The balloon was then deflated, and a control angiogram was performed. Restoration of flow was confirmed. Angiogram of the cervical ICA was performed. Balloon guide was then removed. The skin at the puncture site was then cleaned with Chlorhexidine. The 8 French sheath was removed and an 73F angioseal was deployed. Flat panel CT was performed. Patient tolerated the procedure well and remained hemodynamically stable throughout. No complications were encountered and no significant blood loss encountered. IMPRESSION: Status post ultrasound guided access right common femoral artery for left-sided cervical/cerebral angiogram and treatment of terminus/MCA occlusion with 2 passes of aspiration thrombectomy/mechanical thrombectomy, achieving TICI 3  flow. Angio-Seal for hemostasis. Angiogram reveals significant fibromuscular dysplasia (FMD) of the left cervical ICA. Signed, Dulcy Fanny. Dellia Nims, [facility] PLAN: The patient will remain intubated. ICU status Target systolic blood pressure of 120-140 Right hip straight time 4 hours Frequent neurovascular checks Repeat neurologic imaging with CT and/MRI at the discretion of neurology team Electronically Signed   By: Corrie Mckusick D.O.   On: 11/02/2020 08:32   DG Chest Port 1 View  Result Date: 11/01/2020 CLINICAL DATA:  Endotracheal tube EXAM: PORTABLE CHEST 1 VIEW COMPARISON:  10/31/2020 FINDINGS: Small left pleural effusion and basilar atelectasis. Normal cardiomediastinal contours. Unchanged chronic interstitial prominence. IMPRESSION: Small left pleural effusion and basilar atelectasis, unchanged. Electronically Signed   By: Ulyses Jarred M.D.   On: 11/01/2020 22:12   DG Chest Port 1 View  Result Date: 10/30/2020 CLINICAL DATA:  Shortness of breath and weakness. EXAM: PORTABLE CHEST 1 VIEW COMPARISON:  10/15/2020 FINDINGS: Numerous leads and wires project over the chest. Patient rotated minimally left. Midline trachea. Mild cardiomegaly. Small left pleural effusion. No pneumothorax. Interstitial prominence and indistinctness is increased. Left greater than right base airspace disease. IMPRESSION: Cardiomegaly with mild congestive heart failure. Small left pleural effusion with bibasilar airspace disease. This could represent atelectasis or, especially at the left lung base, infection. Electronically Signed   By: Abigail Miyamoto M.D.   On: 10/30/2020 10:12   IR PERCUTANEOUS ART THROMBECTOMY/INFUSION INTRACRANIAL INC DIAG ANGIO  Result Date: 11/02/2020 INDICATION: 87 year old female with acute left-sided ICA terminus occlusion, presents for angiogram and mechanical thrombectomy EXAM: ULTRASOUND-GUIDED ACCESS RIGHT COMMON FEMORAL ARTERY  CERVICAL AND CEREBRAL ANGIOGRAM MECHANICAL THROMBECTOMY LEFT ICA/MCA SPIN CT ANGIO-SEAL FOR HEMOSTASIS COMPARISON:  CT imaging same day MEDICATIONS: None ANESTHESIA/SEDATION: The anesthesia team was present to provide general endotracheal tube anesthesia and for patient monitoring during the procedure. Intubation was performed in room 2, neuro biplane room. Left radial arterial line was performed by the anesthesia team. Interventional neuro radiology nursing staff was also present. CONTRAST:  64 cc FLUOROSCOPY TIME:  Fluoroscopy Time: 15 minutes 12 seconds (793.6 mGy). COMPLICATIONS: None TECHNIQUE: Informed written consent was obtained from the patient's family after a thorough discussion of  the procedural risks, benefits and alternatives. Specific risks discussed include: Bleeding, infection, contrast reaction, kidney injury/failure, need for further procedure/surgery, arterial injury or dissection, embolization to new territory, intracranial hemorrhage (10-15% risk), neurologic deterioration, cardiopulmonary collapse, death. All questions were addressed. Maximal Sterile Barrier Technique was utilized including during the procedure including caps, mask, sterile gowns, sterile gloves, sterile drape, hand hygiene and skin antiseptic. A timeout was performed prior to the initiation of the procedure. The anesthesia team was present to provide general endotracheal tube anesthesia and for patient monitoring during the procedure. Interventional neuro radiology nursing staff was also present. FINDINGS: Initial Findings: Aortic arch is 3 vessel arch, type 3. Left common carotid artery:  Normal course caliber and contour. Left external carotid artery: Patent with antegrade flow. Left internal carotid artery: Significant accordion appearance of the cervical ICA in the proximal and mid segment, less prominent at the skull base, compatible with fibromuscular dysplasia. There is slow flow through the distal cervical segment,  including the horizontal and vertical petrous segment, the lateral segment, cavernous segment and supra ophthalmic segment. There is a meniscus occlusion before the terminus with no flow into either the ACA or the MCA. Left MCA:  No flow into the MCA on the initial. Left ACA:  No flow into the ACA on the initial. Completion Findings: Left MCA: Complete restoration of flow through the MCA after the second pass. No significant posterior cerebral artery, with patent anterior choroidal artery. The left temporal lobe perfused by the PCA, with the preoperative CT demonstrating filling through the temporal lobe. TICI 3: Complete perfusion of the territory Flat panel CT: No hemorrhage identified. PROCEDURE: The anesthesia team was present to provide general endotracheal tube anesthesia and for patient monitoring during the procedure. Intubation was performed in negative pressure [place] in neuro IR holding. Interventional neuro radiology nursing staff was also present. Ultrasound survey of the right inguinal region was performed with images stored and sent to PACs. 11 blade scalpel was used to make a small incision. Blunt dissection was performed with US guidance. A micropuncture needle was used access the right common femoral artery under ultrasound. With excellent arterial blood flow returned, an .018 micro wire was passed through the needle, observed to enter the abdominal aorta under fluoroscopy. The needle was removed, and a micropuncture sheath was placed over the wire. The inner dilator and wire were removed, and an 035 wire was advanced under fluoroscopy into the abdominal aorta. The sheath was removed and a 25cm 70F straight vascular sheath was placed. The dilator was removed and the sheath was flushed. Sheath was attached to pressurized and heparinized saline bag for constant forward flow. A David catheter was then advanced over the 035 wire to the proximal descending thoracic aorta. The wire was then removed and the  David catheter was double flushed. David catheter was then used to select the origin of the left common carotid artery. A standard Glidewire was then advanced into the cervical ICA. David catheter was then advanced into the distal segment of the cervical ICA. Glidewire was removed, catheter was double flush, and angiogram confirmed location. A rose in wire was then passed into the distal cervical segment and the David catheter was removed. Coaxial system was then advanced over the [employer] wire. This included a 95cm 087 "[place]" balloon guide with coaxial 100 cm Davis diagnostic catheter. Once the coaxial system was into the distal cervical segment, the David catheter and the Rose an wire were removed from the balloon guide with adequate  flush at the hub of the balloon guide. Double flush was performed. Formal angiogram was performed. Road map function was used once the occluded vessel was identified. Copious back flush was performed and the balloon catheter was attached to heparinized and pressurized saline bag for forward flow. A second coaxial system was then advanced through the balloon catheter, which included the selected intermediate catheter, microcatheter, and microwire. In this scenario, the set up included a zoom 71 aspiration catheter, a Trevo Provue18 microcatheter, and 014 synchro soft wire. This system was advanced through the balloon guide catheter under the road-map function, with adequate back-flush at the rotating hemostatic valve at that back end of the balloon guide. Microcatheter and the intermediate catheter system were advanced through the carotid siphon to the level of the occlusion. Some manipulation was required around the ophthalmic artery origin, as withdrawal of the zoom catheter was performed with manual rotation to attempt a more suitable configuration at the ophthalmic artery origin. Once the zoom catheter was positioned proximal to the occlusion and beyond the ophthalmic artery, the  microwire and microcatheter were removed. Rotating hemostatic valve was removed. The proprietary engine was then attached to the hub of the zoom catheter, with aspiration initiated as the first pass. With blood return confirmed the catheter was gently advanced into the site of the occlusion and passed into the proximal M1 segment. We observed approximately 60 seconds of aspiration time. Before withdrawing the aspiration catheter into the balloon guide, the balloon on the tip of the balloon guide was gently inflated under fluoroscopy. We then withdrew the aspiration catheter into the balloon guide confirmed free flow of blood return from the zoom catheter, we withdrew the aspiration catheter and then confirmed free flow of blood at the hub of the balloon guide catheter. Balloon was deflated and then angiogram was performed. This restored flow through the terminus segment into the A1 segment. There was migration of embolus into the proximal M1 segment, with no flow through the proximal M1. We elected a second pass. The same coaxial system was then advanced through the balloon catheter, which included the zoom 71 aspiration catheter, a Trevo Provue18 microcatheter, and 014 synchro soft wire. This system was advanced through the balloon guide catheter under the road-map function, with adequate back-flush at the rotating hemostatic valve at that back end of the balloon guide. The microwire and microcatheter were navigated with a looped microwire configuration through the terminus segment into the MCA. The microwire was passed beyond the site of the occlusion, with the microcatheter gently pushed through over the microwire into the distal M1 segment to the insular region. Zoom catheter was then positioned in a location at the terminus directed towards the MCA. Microwire was gently removed from the microcatheter with adequate flush at the hub. Blood was then aspirated through the hub of the microcatheter, and a gentle  contrast injection was performed confirming intraluminal position. A rotating hemostatic valve was then attached to the back end of the microcatheter, and a pressurized and heparinized saline bag was attached to the catheter. 4 x 40 solitaire device was then selected. Back flush was achieved at the rotating hemostatic valve, and then the device was gently advanced through the microcatheter to the distal end. The retriever was then unsheathed by withdrawing the microcatheter under fluoroscopy. Once the retriever was completely unsheathed, the microcatheter was carefully stripped from the delivery device. Control angiogram was performed from the intermediate catheter. A 3 minute time interval was observed. The balloon at the  balloon guide catheter was then inflated under fluoroscopy for proximal flow arrest. Constant aspiration using the proprietary engine was then performed at the zoom 71 catheter, which was slowly advanced until stasis of blood flow was observed. The retriever was then gently and slowly withdrawn with fluoroscopic observation. Once the retriever was "corked" within the tip of the intermediate catheter, both were removed from the system. Free aspiration was confirmed at the hub of the balloon guide catheter, with free blood return confirmed. The balloon was then deflated, and a control angiogram was performed. Restoration of flow was confirmed. Angiogram of the cervical ICA was performed. Balloon guide was then removed. The skin at the puncture site was then cleaned with Chlorhexidine. The 8 French sheath was removed and an 23F angioseal was deployed. Flat panel CT was performed. Patient tolerated the procedure well and remained hemodynamically stable throughout. No complications were encountered and no significant blood loss encountered. IMPRESSION: Status post ultrasound guided access right common femoral artery for left-sided cervical/cerebral angiogram and treatment of terminus/MCA occlusion with  2 passes of aspiration thrombectomy/mechanical thrombectomy, achieving TICI 3 flow. Angio-Seal for hemostasis. Angiogram reveals significant fibromuscular dysplasia (FMD) of the left cervical ICA. Signed, Dulcy Fanny. Dellia Nims, [facility] Radiology PLAN: The patient will remain intubated. ICU status Target systolic blood pressure of 120-140 Right hip straight time 4 hours Frequent neurovascular checks Repeat neurologic imaging with CT and/MRI at the discretion of neurology team Electronically Signed   By: Corrie Mckusick D.O.   On: 11/02/2020 08:32   CT ANGIO HEAD NECK W WO CM (CODE STROKE)  Result Date: 11/01/2020 CLINICAL DATA:  Neuro deficit, acute, stroke suspected. Clinical concern for left MCA infarct. EXAM: CT ANGIOGRAPHY HEAD AND NECK TECHNIQUE: Multidetector CT imaging of the head and neck was performed using the standard protocol during bolus administration of intravenous contrast. Multiplanar CT image reconstructions and MIPs were obtained to evaluate the vascular anatomy. Carotid stenosis measurements (when applicable) are obtained utilizing NASCET criteria, using the distal internal carotid diameter as the denominator. CONTRAST:  89mL OMNIPAQUE IOHEXOL 350 MG/ML SOLN COMPARISON:  Head CT 03/09/2020. High-resolution chest CT 10/05/2020. FINDINGS: CT HEAD FINDINGS Brain: There is no evidence of an acute infarct, intracranial hemorrhage, mass, midline shift, or extra-axial fluid collection. Mild cerebral atrophy is within normal limits for age. Vascular: Calcified atherosclerosis at the skull base. Hyperdense left ICA terminus/proximal left MCA. Skull: No fracture or suspicious osseous lesion. Sinuses: Clear paranasal sinuses. Persistent moderate right mastoid effusion. Orbits: Bilateral cataract extraction. Review of the MIP images confirms the above findings CTA NECK FINDINGS Aortic arch: Standard 3 vessel aortic arch with mild atherosclerotic  plaque. Widely patent arch vessel origins. Right carotid system: Patent with minimal calcified plaque at the carotid bifurcation. No evidence of a significant stenosis or dissection. Beading of the mid to distal cervical ICA. Left carotid system: The common carotid artery and bifurcation are widely patent and there is a normal appearance of the proximal most ICA, however there is progressively diminishing enhancement of the mid and distal portions of the cervical ICA with only faint enhancement at the skull base. A discrete dissection flap is not visible. Vertebral arteries: Patent and codominant without evidence of stenosis or dissection. Skeleton: Focally advanced cervical disc degeneration at C5-6 where there is grade 1 anterolisthesis. Other neck: No evidence of cervical lymphadenopathy or mass. Upper chest: Small bilateral pleural effusions. Bilateral ground-glass opacities with interlobular septal thickening, increased from the prior chest CT. Borderline  enlarged mediastinal lymph nodes, potentially reactive. Review of the MIP images confirms the above findings CTA HEAD FINDINGS Anterior circulation: There is progressively diminishing faint opacification of the petrous and cavernous segments of the left ICA with absent opacification from the level of the anterior genu through the terminus. The left M1 segment is occluded proximally with reconstitution at the level of the MCA bifurcation although reconstituted MCA branches are mildly attenuated diffusely. The left A1 segment reconstitutes just beyond its origin. The right MCA and right ACA are patent without evidence of a significant proximal stenosis. No aneurysm is identified. Posterior circulation: Intracranial vertebral arteries are patent to the basilar. Patent PICA, AICA, and SCA origins are visualized bilaterally. The basilar artery is widely patent. Posterior communicating arteries are diminutive or absent. The PCAs are patent with mild multifocal P2  stenoses more notable on the left than the right. No aneurysm is identified. Venous sinuses: Patent. Anatomic variants: None. Review of the MIP images confirms the above findings IMPRESSION: 1. Occlusion of the intracranial left ICA including the ICA terminus and left MCA and ACA origins. Reconstitution of the left MCA and ACA as above. 2. No visible acute infarct or intracranial hemorrhage. ASPECTS of 10. 3. Small pleural effusions and partially visualized bilateral lung opacities, nonspecific but could reflect edema or infection. 4.  Aortic Atherosclerosis (ICD10-I70.0). These results were called by telephone at the time of interpretation on 11/01/2020 at 6:40 pm to Dr. Su Monks, who verbally acknowledged these results. Electronically Signed   By: Logan Bores M.D.   On: 11/01/2020 19:01    Labs:  CBC: Recent Labs    10/30/20 0926 10/31/20 0658 11/01/20 0532 11/01/20 2209 11/02/20 0509  WBC 10.6* 8.3 7.8  --  8.0  HGB 11.4* 10.8* 10.3* 10.2* 9.0*  HCT 36.0 33.4* 32.4* 30.0* 28.9*  PLT 507* 434* 396  --  377    COAGS: Recent Labs    10/15/20 1437  INR 3.0*    BMP: Recent Labs    10/30/20 0939 10/31/20 0658 11/01/20 0532 11/01/20 2209 11/02/20 0509  NA 138 137 138 138 136  K 4.2 3.6 4.7 3.5 3.7  CL 106 102 105  --  102  CO2 20* 28 28  --  23  GLUCOSE 120* 99 103*  --  85  BUN 18 18 18   --  11  CALCIUM 8.4* 8.3* 8.4*  --  8.0*  CREATININE 0.91 1.03* 1.01*  --  0.85  GFRNONAA >60 52* 54*  --  >60    LIVER FUNCTION TESTS: Recent Labs    04/02/20 1021 06/08/20 1445 09/07/20 2307 10/30/20 0939 11/01/20 0532  BILITOT 1.0 0.7 0.8 1.8*  --   AST 58* 29 40 24  --   ALT 64* 30 31 15   --   ALKPHOS 90 105 106 89  --   PROT 6.8 6.7 6.4* 6.8  --   ALBUMIN 3.6 3.8 3.4* 3.3* 2.6*    Assessment and Plan:  CVA L ICA/MCA revascularization 9/18 with Dr Earleen Newport Doing well-- follows all commands and moving all 4s with good strength To be extubated asap Dr Earleen Newport aware  of note  Electronically Signed: Lavonia Drafts, PA-C 11/02/2020, 9:04 AM   I spent a total of 15 Minutes at the the patient's bedside AND on the patient's [place] floor or unit, greater than 50% of which was counseling/coordinating care for CVA;  L ICA/MCA revascularization

## 2020-11-02 NOTE — Telephone Encounter (Signed)
LMTCB

## 2020-11-02 NOTE — Progress Notes (Signed)
Initial Nutrition Assessment  DOCUMENTATION CODES:   Severe malnutrition in context of chronic illness  INTERVENTION:   Once diet is advanced recommend  Work with pt to provide food preferences  Boost Breeze po TID, each supplement provides 250 kcal and 9 grams of protein  Magic cup TID with meals, each supplement provides 290 kcal and 9 grams of protein  Check vitamin labs: Vitamin C, Zinc, Vitamin B1, Vitamin A, Copper, Vitamin B6   NUTRITION DIAGNOSIS:   Severe Malnutrition related to chronic illness (chronic CHF) as evidenced by severe fat depletion, severe muscle depletion, energy intake < or equal to 75% for > or equal to 1 month, percent weight loss.  GOAL:   Patient will meet greater than or equal to 90% of their needs  MONITOR:   Diet advancement  REASON FOR ASSESSMENT:   Rounds    ASSESSMENT:   Pt with PMH of CHF, Afib with RVR, PE, HTN, anxiety, GERD, and hypothyroidism who was admitted with acute on chronic CHF exacerbation and LLL PNA.   Noted on chart review pt's weight trending down. Currently 95 lb. Early 9/22 it was 98 lb and in July 2022 it was 102 lb. This would be a 7% weight loss in 2 months. Pt and daughter provide hx. Pt lives at home and takes care of herself. She does not use any assistive devices. Granddaughter lives there too but does no patient care. Pt reports that she has been losing weight PTA. States her appetite is poor. Per daughter worse in the last few months.  Breakfast: pills and sometimes a gravy biscuit Lunch: none Dinner: okra lately Pt and daughter report that she is seen by a GI doctor. She has had her esophagus stretched before after getting a piece of pork chop stuck. She is unable to eat these types of foods anymore. Food must be soft and not dry. Per RN will attempt swallow 4 hours after extubation today.   Pt reports that food tastes bad. She no longer likes eating a tomato sandwich and says it tastes bad.  Per daughter pt  has had diarrhea for 6 weeks. This started before her antibiotic. She has been taking a probiotic at home.  Pt does not like ensure or boost, these make her gag. She eats very little ice cream but does enjoy it.    9/16 admit to [facility] 9/18 code stroke for L MCA stroke tx to [facility], s/p IR thrombectomy  9/19 extubated; await swallow eval   Pt discussed during ICU rounds and with RN.   Medications reviewed and include: SSI, protonix Labs reviewed    NUTRITION - FOCUSED PHYSICAL EXAM:  Flowsheet Row Most Recent Value  Orbital Region Moderate depletion  Upper Arm Region Severe depletion  Thoracic and Lumbar Region Severe depletion  Buccal Region Moderate depletion  Temple Region Severe depletion  Clavicle Bone Region Severe depletion  Clavicle and Acromion Bone Region Severe depletion  Scapular Bone Region Severe depletion  Dorsal Hand Severe depletion  Patellar Region Severe depletion  Anterior Thigh Region Severe depletion  Posterior Calf Region Severe depletion  Edema (RD Assessment) None  Hair Reviewed  Eyes Reviewed  Mouth Reviewed  Skin Reviewed  Nails Reviewed       Diet Order:   Diet Order             Diet regular Room service appropriate? Yes with Assist; Fluid consistency: Thin  Diet effective now  EDUCATION NEEDS:   Education needs have been addressed  Skin:  Skin Assessment: Reviewed RN Assessment  Last BM:  9/19 small, type 4  Height:   Ht Readings from Last 1 Encounters:  10/31/20 5\' 3"  (1.6 m)    Weight:   Wt Readings from Last 1 Encounters:  11/02/20 43.2 kg    BMI:  Body mass index is 16.87 kg/m.  Estimated Nutritional Needs:   Kcal:  1500-1700  Protein:  70-80 grams  Fluid:  > 1.5 L/day  Lockie Pares., RD, LDN, CNSC See AMiON for contact information

## 2020-11-02 NOTE — Evaluation (Signed)
Speech Language Pathology Evaluation Patient Details Name: Frances Kent MRN: [id number] DOB: 1930/11/29 Today's Date: 11/02/2020 Time: 1440-1501 SLP Time Calculation (min) (ACUTE ONLY): 21 min  Problem List:  Patient Active Problem List   Diagnosis Date Noted   Acute ischemic left ICA stroke ([place]) 11/01/2020   Acute cerebrovascular accident (CVA) due to occlusion of left carotid artery ([place]) 11/01/2020   Endotracheally intubated    Prolonged Q-T interval on ECG    Acute on chronic systolic (congestive) heart failure (HCC)    Atrial fibrillation with RVR ([place]) 10/30/2020   A-fib ([place]) 10/16/2020   Antibiotic-associated diarrhea 10/15/2020   ILD (interstitial lung disease) ([place]) 09/22/2020   Tachyarrhythmia 09/08/2020   Sick sinus syndrome ([place]) 09/08/2020   Elevated serum free T4 level 09/08/2020   Purpura senilis ([place]) 08/28/2020   Hiatal hernia 08/09/2020   Atrial fibrillation with rapid ventricular response ([place]) 08/06/2020   Thrombophilia ([place]) 08/06/2020   History of COVID-19 03/03/2020   Schatzki's ring of distal esophagus 12/11/2019   Erosive gastritis 11/30/2019   Dysphagia 11/27/2019   Painful swallowing 11/27/2019   Gastroesophageal reflux disease 11/27/2019   Stage 3a chronic kidney disease ([place]) 04/11/2019   Coagulopathy ([place]) 09/12/2018   Essential hypertension 09/12/2018   CHF (congestive heart failure) ([place]) 08/15/2018   Hypothyroidism due to acquired atrophy of thyroid 05/08/2018   Unintentional weight loss 05/08/2018   Myalgia due to statin 05/08/2018   Abdominal aortic atherosclerosis ([place]) 05/07/2018   Bradycardia 04/28/2018   Pleural effusion on left 02/27/2018   Moderate mitral stenosis 01/10/2018   Lumbar radiculitis 01/07/2018   B12 deficiency 07/26/2017   Atrial fibrillation status post cardioversion ([place]) 04/06/2016   Hospital discharge follow-up 03/10/2016   Vitamin D deficiency 04/08/2015   Cervical spine degeneration 12/13/2014   History  of pulmonary embolism 12/02/2014   Insomnia 10/04/2014   Moderate tricuspid insufficiency 09/17/2014   Generalized anxiety disorder 03/28/2013   Mitral valve prolapse 03/28/2013   Routine adult health maintenance 03/23/2012   Fatigue 03/23/2012   Cough 04/17/2011   Screening for breast cancer 11/29/2010   Macular degeneration, left eye 11/29/2010   Screening for colon cancer 11/29/2010   Hyperlipidemia LDL goal <70 11/29/2010   GERD (gastroesophageal reflux disease)    Past Medical History:  Past Medical History:  Diagnosis Date   Atrial fibrillation ([place])    Benign breast cyst in female, left 10/07/2016   Colon adenomas    GERD (gastroesophageal reflux disease)    Hypertension    Hypothyroidism    Macular degeneration    Mitral regurgitation    Pulmonary embolism ([place]) 02/2016   SVT (supraventricular tachycardia) ([place])    Past Surgical History:  Past Surgical History:  Procedure Laterality Date   APPENDECTOMY  1960   BREAST CYST ASPIRATION Left 2017   CARDIOVERSION N/A 04/05/2016   Procedure: Cardioversion;  Surgeon: [name], MD;  Location: [facility] ORS;  Service: Cardiovascular;  Laterality: N/A;   CHOLECYSTECTOMY  1985   ESOPHAGOGASTRODUODENOSCOPY (EGD) WITH PROPOFOL N/A 11/27/2019   Procedure: ESOPHAGOGASTRODUODENOSCOPY (EGD) WITH PROPOFOL;  Surgeon: Lesly Rubenstein, MD;  Location: [facility] ENDOSCOPY;  Service: Endoscopy;  Laterality: N/A;   ESOPHAGOGASTRODUODENOSCOPY (EGD) WITH PROPOFOL N/A 12/24/2019   Procedure: ESOPHAGOGASTRODUODENOSCOPY (EGD) WITH PROPOFOL;  Surgeon: Lesly Rubenstein, MD;  Location: [facility] ENDOSCOPY;  Service: Endoscopy;  Laterality: N/A;   IR CT HEAD LTD  11/01/2020   IR PERCUTANEOUS ART THROMBECTOMY/INFUSION INTRACRANIAL INC DIAG ANGIO  11/01/2020   IR US GUIDE VASC ACCESS RIGHT  11/02/2020   OVARIAN CYST REMOVAL     RADIOLOGY WITH ANESTHESIA N/A 11/01/2020   Procedure: RADIOLOGY WITH ANESTHESIA;  Surgeon: Luanne Bras, MD;  Location: [facility];  Service: Radiology;  Laterality: N/A;   TEE WITHOUT CARDIOVERSION N/A 02/01/2018   Procedure: TRANSESOPHAGEAL ECHOCARDIOGRAM (TEE);  Surgeon: [name], MD;  Location: [facility] ORS;  Service: Cardiovascular;  Laterality: N/A;   HPI:  87 y.o. F with PMHx of atrial fibrillation, mitral valve stenosis w/ regurgitation, PE, hypertension, hypothyroidism, anxiety.  She initially presented to [facility] hospital on 9/18 with worsening shortness of breath, palpitations, fatigue and edema that had started on 8/17, admitted for atrial fibrillation with RVR concern for potential left lower lobe pneumonia with a mildly elevated BNP from baseline of 900 to 1000 concerning for acute decompensated heart failure. Imaging revealed tiny acute infarcts at the left basal ganglia.   Assessment / Plan / Recommendation Clinical Impression  Pt was seen for a speech language evaluation. Oral mechanism exam revealed mild lingual weakness but was otherwise unremarkable. 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 Pt reports she lives with family but manages her own finances and completes ADLs independently. Pt completed school through 9th grade. SLP administered SLUMS evaluation to formally assess cognition; pt scored 13/30. Score c/b overarching impairments in short term memory, working memory, and divergent naming, with relative strengths in the areas of visual/spatial skills and orientation. Pt awareness of deficits is impaired; pt reports she would have no difficulty with ADLs or managing finances as she did before her hospital stay. SLP will continue to follow for cognitive and speech therapy targeting dysarthria, awareness, and short term and working memory.    SLP Assessment  SLP Recommendation/Assessment: Patient needs continued Speech Lanaguage Pathology Services SLP Visit Diagnosis: Cognitive  communication deficit (R41.841);Dysarthria and anarthria (R47.1)    Recommendations for follow up therapy are one component of a multi-disciplinary discharge planning process, led by the attending physician.  Recommendations may be updated based on patient status, additional functional criteria and insurance authorization.    Follow Up Recommendations  Outpatient SLP;24 hour supervision/assistance    Frequency and Duration min 2x/week  2 weeks      SLP Evaluation Cognition  Overall Cognitive Status: Impaired/Different from baseline Arousal/Alertness: Awake/alert Orientation Level: Oriented to person;Oriented to place;Oriented to time Year: 2022 Day of Week: Correct Memory: Impaired Memory Impairment: Storage deficit;Retrieval deficit Awareness: Impaired Awareness Impairment: Anticipatory impairment;Emergent impairment Problem Solving:  (diagnostic treatment) Safety/Judgment: Impaired       Comprehension  Auditory Comprehension Overall Auditory Comprehension: Appears within functional limits for tasks assessed Conversation: Simple Visual Recognition/Discrimination Discrimination: Within Function Limits Reading Comprehension Reading Status: Not tested    Expression Expression Primary Mode of Expression: Verbal Verbal Expression Overall Verbal Expression: Appears within functional limits for tasks assessed Initiation: No impairment Level of Generative/Spontaneous Verbalization: Sentence Repetition:  (NT) Pragmatics: No impairment Written Expression Dominant Hand: Left Written Expression: Not tested   Oral / Motor  Oral Motor/Sensory Function Overall Oral Motor/Sensory Function: Mild impairment Facial ROM: Within Functional Limits Facial Symmetry: Within Functional Limits Facial Strength: Within Functional Limits Facial Sensation: Within Functional Limits Lingual ROM: Within Functional Limits Lingual Symmetry: Within Functional Limits Lingual Strength:  Reduced Velum: Within Functional Limits Mandible: Within Functional Limits Motor Speech Overall Motor Speech: Impaired Respiration: Impaired Level of Impairment: Phrase Phonation: Hoarse;Low vocal intensity Resonance: Within functional limits Articulation: Impaired Level of Impairment: Word Intelligibility:  Intelligibility reduced Word: 75-100% accurate Phrase: 75-100% accurate Sentence: 75-100% accurate Conversation: 75-100% accurate Motor Planning: Witnin functional limits Motor Speech Errors: Aware   GO           Dewitt Rota, SLP-Student          Dewitt Rota 11/02/2020, 4:03 PM

## 2020-11-02 NOTE — Procedures (Signed)
Extubation Procedure Note  Patient Details:   Name: Frances Kent DOB: 1931/02/04 MRN: [id number]   Airway Documentation:    Vent end date: 11/02/20 Vent end time: 0906   Evaluation  O2 sats: stable throughout Complications: No apparent complications Patient did tolerate procedure well. Bilateral Breath Sounds: Clear, Diminished (slight coarse)   Yes  [name] Jan 11/02/2020, 9:07 AM Pt was extubated. Cuff leak was heard and no sign of stridor. Patient is stable on [place] 4L. RT will continue to monitor.

## 2020-11-02 NOTE — Progress Notes (Signed)
OT Cancellation Note  Patient Details Name: Frances Kent MRN: [id number] DOB: January 17, 1931   Cancelled Treatment:    Reason Eval/Treat Not Completed: Other (comment) Discussed with nsg - Plan is to extubate today. Will see after extubated as schedule allows.   Ramond Dial, OT/L   Acute OT Clinical Specialist Acute Rehabilitation Services Pager [phone] Office [phone]  11/02/2020, 8:56 AM

## 2020-11-03 DIAGNOSIS — E43 Unspecified severe protein-calorie malnutrition: Secondary | ICD-10-CM

## 2020-11-03 DIAGNOSIS — E440 Moderate protein-calorie malnutrition: Secondary | ICD-10-CM | POA: Insufficient documentation

## 2020-11-03 LAB — GLUCOSE, CAPILLARY
Glucose-Capillary: 104 mg/dL — ABNORMAL HIGH (ref 70–99)
Glucose-Capillary: 116 mg/dL — ABNORMAL HIGH (ref 70–99)
Glucose-Capillary: 109 mg/dL — ABNORMAL HIGH (ref 70–99)
Glucose-Capillary: 115 mg/dL — ABNORMAL HIGH (ref 70–99)
Glucose-Capillary: 127 mg/dL — ABNORMAL HIGH (ref 70–99)

## 2020-11-03 LAB — CBC
HCT: 29.5 % — ABNORMAL LOW (ref 36.0–46.0)
Hemoglobin: 9.2 g/dL — ABNORMAL LOW (ref 12.0–15.0)
MCH: 28 pg (ref 26.0–34.0)
MCHC: 31.2 g/dL (ref 30.0–36.0)
MCV: 89.7 fL (ref 80.0–100.0)
Platelets: 332 10*3/uL (ref 150–400)
RBC: 3.29 MIL/uL — ABNORMAL LOW (ref 3.87–5.11)
RDW: 18 % — ABNORMAL HIGH (ref 11.5–15.5)
WBC: 6.7 10*3/uL (ref 4.0–10.5)
nRBC: 0 % (ref 0.0–0.2)

## 2020-11-03 LAB — BASIC METABOLIC PANEL
Anion gap: 7 (ref 5–15)
BUN: 12 mg/dL (ref 8–23)
CO2: 26 mmol/L (ref 22–32)
Calcium: 8.1 mg/dL — ABNORMAL LOW (ref 8.9–10.3)
Chloride: 106 mmol/L (ref 98–111)
Creatinine, Ser: 0.94 mg/dL (ref 0.44–1.00)
GFR, Estimated: 58 mL/min — ABNORMAL LOW (ref >60)
Glucose, Bld: 86 mg/dL (ref 70–99)
Potassium: 3.6 mmol/L (ref 3.5–5.1)
Sodium: 139 mmol/L (ref 135–145)

## 2020-11-03 LAB — COPPER, SERUM: Copper: 156 ug/dL (ref 80–158)

## 2020-11-03 LAB — ZINC: Zinc: 66 ug/dL (ref 44–115)

## 2020-11-03 MED ORDER — POTASSIUM CHLORIDE CRYS ER 20 MEQ PO TBCR
40.0000 meq | EXTENDED_RELEASE_TABLET | Freq: Once | ORAL | Status: AC
  Administered 2020-11-03: 40 meq via ORAL
  Filled 2020-11-03 (×2): qty 2

## 2020-11-03 MED ORDER — DILTIAZEM HCL 30 MG PO TABS
60.0000 mg | ORAL_TABLET | Freq: Four times a day (QID) | ORAL | Status: DC
  Administered 2020-11-03 – 2020-11-04 (×5): 60 mg via ORAL
  Filled 2020-11-03: qty 2
  Filled 2020-11-03 (×3): qty 1
  Filled 2020-11-03: qty 2
  Filled 2020-11-03: qty 1

## 2020-11-03 MED ORDER — INSULIN ASPART 100 UNIT/ML IJ SOLN
0.0000 [IU] | Freq: Three times a day (TID) | INTRAMUSCULAR | Status: DC
  Administered 2020-11-04: 1 [IU] via SUBCUTANEOUS

## 2020-11-03 MED ORDER — AMIODARONE HCL 200 MG PO TABS
200.0000 mg | ORAL_TABLET | Freq: Every day | ORAL | Status: DC
  Administered 2020-11-03 – 2020-11-04 (×2): 200 mg via ORAL
  Filled 2020-11-03 (×2): qty 1

## 2020-11-03 NOTE — Progress Notes (Signed)
STROKE TEAM PROGRESS NOTE   INTERVAL HISTORY Two daughters are at the bedside. Pt reclining in bed, awake alert, not in acute distress.  Per patient and family, patient had DVT in the past was put on Eliquis for 6 months then stopped.  Then patient had another DVT and was put on Xarelto.  Currently compliant with Xarelto for A. fib and history of DVT prior admission, however she is taking Xarelto in the morning empty stomach.  Patient still prefer one-time dosing for DOAC, we recommend patient to take Xarelto with dinner.  Vitals:   11/03/20 0800 11/03/20 0900 11/03/20 1000 11/03/20 1100  BP: 114/73 129/74 105/69 94/78  Pulse: 96 94 (!) 106 99  Resp: 20 (!) 23 (!) 23 (!) 21  Temp:      TempSrc:      SpO2: 96% 100% 90% 99%  Weight:       CBC:  Recent Labs  Lab 11/02/20 0509 11/03/20 0118  WBC 8.0 6.7  HGB 9.0* 9.2*  HCT 28.9* 29.5*  MCV 89.8 89.7  PLT 377 580   Basic Metabolic Panel:  Recent Labs  Lab 11/01/20 0532 11/01/20 2209 11/02/20 0509 11/03/20 0118  NA 138   < > 136 139  K 4.7   < > 3.7 3.6  CL 105  --  102 106  CO2 28  --  23 26  GLUCOSE 103*  --  85 86  BUN 18  --  11 12  CREATININE 1.01*  --  0.85 0.94  CALCIUM 8.4*  --  8.0* 8.1*  MG 2.1  --  2.0  --   PHOS 2.6  --  3.1  --    < > = values in this interval not displayed.   Lipid Panel:  Recent Labs  Lab 11/02/20 0509  CHOL 144  TRIG 76  72  HDL 48  CHOLHDL 3.0  VLDL 15  LDLCALC 81   HgbA1c:   Urine Drug Screen: No results for input(s): LABOPIA, COCAINSCRNUR, LABBENZ, AMPHETMU, THCU, LABBARB in the last 168 hours.  Alcohol Level No results for input(s): ETH in the last 168 hours.  IMAGING past 24 hours No results found.  PHYSICAL EXAM  Temp:  [98.2 F (36.8 C)-98.8 F (37.1 C)] 98.5 F (36.9 C) (09/20 0700) Pulse Rate:  [79-110] 99 (09/20 1100) Resp:  [17-27] 21 (09/20 1100) BP: (90-135)/(51-115) 94/78 (09/20 1100) SpO2:  [90 %-100 %] 99 % (09/20 1100)  General - Well nourished,  well developed, in no apparent distress.   Ophthalmologic - fundi not visualized due to noncooperation.  Cardiovascular - Regular rhythm and rate.  On exam, patient awake alert, eyes open, orientated to place, self and time.  Mild dysarthria due to poor denture, no aphasia, able to repeat and name, hypophonia, follows simple commands.  No gaze palsy, visual field fall.  Mild right facial droop.  Right upper extremity mild pronator drift, finger grip 4/5.  Right lower extremity proximal and knee flexion 4/5, left upper and lower extremity at least 4+/5.  Finger-to-nose bilaterally intact however left slow in action.  Sensation symmetrical.  ASSESSMENT/PLAN Patient with hx HTN and a fib on xarelto admitted for a fib w/ RVR, acute on chronic CHF exacerbation and possible LLL PNA. Inpatient stroke code was called for acute onset global aphasia and R hemiplegia. LKW 1800. CTH NAICP, ASPECTS 10. Not a TNK candidate 2/2 on xarelto. CTA showed L ICA occlusion with reconstitution and L M1 cutoff. IR was emergently consulted.  She was taken to IR by Dr. Earleen Newport for thrombectomy.   Stroke:  Small left punctate anterior CR stroke with L ICA and MCA occlusion s/p IR with TICI 3, likely due to afib RVR on Xarelto (but taking empty stomach). CT head No visible acute infarct or intracranial hemorrhage. ASPECTS of 10. Code Stroke CTA head & neck Occlusion of the intracranial left ICA including the ICA terminusand left MCA and ACA origins. Reconstitution of the left MCA and ACA.  MRI brain Tiny acute infarcts at the left basal ganglia. MRA head the left MCA remains patent. Significant motion artifact. 2D Echo EF 40-45% Left atrial size was moderately dilated. Right atrial size was  LDL 81 HgbA1c 5.8 VTE prophylaxis - on Xarelto ON Xarelto 15mg  daily, now on Xarelto.  Recommend patient to continue Xarelto along with dinner. Therapy recommendations:  CIR Disposition: Pending  A fib with RVR on admission On  Xarelto prior to admission On cardizem and amiodarone at home  Restarted Cardizem Repeat EKG, QTC 492 Amiodarone will be restarted today Restarted Xarelto, recommend taking Xarelto along with DNR  Hypertension Home meds: Cardizem Stable On home Cardizem BP goal < 180/105 Long-term BP goal normotensive        LLL CAP Diagnosed prior to this admission Continue ceftriaxone for 5 days total azithro d/c'd by hospitalist due to prolonged qtc Monitoring fever and WBC curve       CHF upon admission  BNP 1073 in ED, Diuresed in ED, on lasix prior to stroke episode Monitor I/O Stable so far  Hyperlipidemia Home meds: None LDL 81, goal < 70  History of statin allergy with severe myalgias Put on Zetia 10 Continue Zetia at discharge  Other Stroke Risk Factors Advanced Age >/= 84   Hx of PE  Family hx stroke (Sister)  Other Active Problems Hypothyroidism Anxiety (home regimen is Xanax prn for anxiety or sleep)   Hospital day # 2  This patient is critically ill due to left MCA stroke, status post mechanical thrombectomy, A. fib RVR, CHF and at significant risk of neurological worsening, death form heart failure, recurrent stroke, hemorrhagic conversion. This patient's care requires constant monitoring of vital signs, hemodynamics, respiratory and cardiac monitoring, review of multiple databases, neurological assessment, discussion with family, other specialists and medical decision making of high complexity. I spent 40 minutes of neurocritical care time in the care of this patient. I had long discussion with 2 daughters at bedside, updated pt current condition, treatment plan and potential prognosis, and answered all the questions.  They expressed understanding and appreciation.   Rosalin Hawking, MD PhD Stroke Neurology 11/03/2020 12:03 PM   To contact Stroke Continuity provider, please refer to [email or web address] After hours, contact General Neurology

## 2020-11-03 NOTE — Progress Notes (Signed)
Inpatient Rehab Admissions Coordinator Note:   Per therapy recommendations patient was screened for CIR candidacy by Michel Santee, PT. At this time, pt appears to be a potential candidate for CIR. I will place an order for rehab consult for full assessment, per our protocol.  Please contact me any with questions.Shann Medal, PT, DPT [phone] 11/03/20 9:20 AM

## 2020-11-03 NOTE — Progress Notes (Signed)
Physical Therapy Treatment Patient Details Name: Frances Kent MRN: [id number] DOB: 06/13/1930 Today's Date: 11/03/2020   History of Present Illness Pt presented to [facility] on 9/17 with SOB, weakness and heart palpitations. Pt adm for Afib with RVR and mild CHF exacerbation. On 9/18 around 1800 nurse witnessed rt sided weakness and aphasia. Code stroke called. CT showed lt ICA occlusion and pt transferred to [facility] for emergent mechanical thrombectomy 9/18.  PMH: HTN, Afib, PE, and GERD.    PT Comments    Pt progressing well. Pt remains to be deconditioned with decreased activity tolerance and overall strength. Pt was indep and amb without AD PTA. Pt to benefit from CIR to maximize functional return. Pt with good home set up and support. Acute PT to cont to follow.    Recommendations for follow up therapy are one component of a multi-disciplinary discharge planning process, led by the attending physician.  Recommendations may be updated based on patient status, additional functional criteria and insurance authorization.  Follow Up Recommendations  CIR     Equipment Recommendations  Rolling walker with 5" wheels    Recommendations for Other Services Rehab consult     Precautions / Restrictions Precautions Precautions: Fall Restrictions Weight Bearing Restrictions: No     Mobility  Bed Mobility Overal bed mobility: Needs Assistance Bed Mobility: Sidelying to Sit   Sidelying to sit: Min assist       General bed mobility comments: pt in L sidelying sleeping upon PT arrival, pt able to initiate pushing self up to sitting EOB, minA to complete 2nd half of trunk elevation    Transfers Overall transfer level: Needs assistance Equipment used: Rolling walker (2 wheeled) Transfers: Sit to/from Stand Sit to Stand: Min guard         General transfer comment: verbal cues to push up from bed, pt with posterior lean up against bed, verbal cues to come  forward  Ambulation/Gait Ambulation/Gait assistance: Min assist Gait Distance (Feet): 50 Feet Assistive device: Rolling walker (2 wheeled) Gait Pattern/deviations: Step-through pattern;Decreased stride length;Trunk flexed;Narrow base of support Gait velocity: decr Gait velocity interpretation: <1.31 ft/sec, indicative of household ambulator General Gait Details: minA for walker management, standing rest break 1/2 way through due to "I'm SOB and my legs are tired." HR 100-139bpm, noted pt in afib - RN aware, pt slow pace, fatigues quickly   Stairs             Wheelchair Mobility    Modified Rankin (Stroke Patients Only) Modified Rankin (Stroke Patients Only) Pre-Morbid Rankin Score: Slight disability Modified Rankin: Moderately severe disability     Balance Overall balance assessment: Needs assistance Sitting-balance support: No upper extremity supported;Feet supported Sitting balance-Leahy Scale: Fair     Standing balance support: Bilateral upper extremity supported Standing balance-Leahy Scale: Poor Standing balance comment: requires AD for safe ambulation at this time                            Cognition Arousal/Alertness: Awake/alert Behavior During Therapy: WFL for tasks assessed/performed Overall Cognitive Status: Impaired/Different from baseline                     Current Attention Level: Selective Memory: Decreased short-term memory Following Commands: Follows one step commands consistently   Awareness: Emergent Problem Solving: Slow processing General Comments: pt appears improved from yesterday, overall slow response time but did follow all commands, pt with flat affect  Exercises      General Comments General comments (skin integrity, edema, etc.): pt in Afib, HR 100-139bpm      Pertinent Vitals/Pain Pain Assessment: No/denies pain    Home Living                      Prior Function            PT Goals  (current goals can now be found in the care plan section) Progress towards PT goals: Progressing toward goals    Frequency    Min 4X/week      PT Plan Current plan remains appropriate    Co-evaluation              AM-PAC PT "6 Clicks" Mobility   Outcome Measure  Help needed turning from your back to your side while in a flat bed without using bedrails?: A Little Help needed moving from lying on your back to sitting on the side of a flat bed without using bedrails?: A Little Help needed moving to and from a bed to a chair (including a wheelchair)?: A Little Help needed standing up from a chair using your arms (e.g., wheelchair or bedside chair)?: A Little Help needed to walk in hospital room?: A Little Help needed climbing 3-5 steps with a railing? : A Lot 6 Click Score: 17    End of Session Equipment Utilized During Treatment: Gait belt Activity Tolerance: Patient limited by fatigue Patient left: in chair;with call bell/phone within reach;with chair alarm set;with family/visitor present Nurse Communication: Mobility status PT Visit Diagnosis: Unsteadiness on feet (R26.81);Muscle weakness (generalized) (M62.81);Difficulty in walking, not elsewhere classified (R26.2)     Time: 9191-6606 PT Time Calculation (min) (ACUTE ONLY): 23 min  Charges:  $Gait Training: 23-37 mins                     Kittie Plater, PT, DPT Acute Rehabilitation Services Pager #: [phone] Office #: [phone]    Berline Lopes 11/03/2020, 1:12 PM

## 2020-11-03 NOTE — Progress Notes (Signed)
Inpatient Rehabilitation Admissions Coordinator   Inpatient rehab consult received. I met with patient and two of her daughters. We discussed goals and expectations of a possible CIR admit. They prefer Cir then home with family support. I will begin Auth with [facility] medicare . I will follow up tomorrow.  Danne Baxter, RN, MSN Rehab Admissions Coordinator [phone] 11/03/2020 1:21 PM

## 2020-11-03 NOTE — Progress Notes (Signed)
Referring Physician(s): Dr. Curly Shores  Supervising Physician: Corrie Mckusick  Patient Status:  [facility] - In-pt  Chief Complaint:  Code stroke on 9.18.22  Subjective:  Patient states that she is doing well. She is drowsy but states that she was out of bed in the recliner earlier. Endorses a dry mouth  Allergies: Statins  Medications: Prior to Admission medications   Medication Sig Start Date End Date Taking? Authorizing Provider  acetaminophen (TYLENOL) 500 MG tablet Take 500 mg by mouth every 6 (six) hours as needed.    [provider]  ALPRAZolam Duanne Moron) 0.25 MG tablet Take 1 tablet (0.25 mg total) by mouth at bedtime as needed for anxiety or sleep. 08/26/20   [name], MD  amiodarone (PACERONE) 200 MG tablet Take 200 mg by mouth daily.    [provider]  benzonatate (TESSALON) 200 MG capsule Take 1 capsule (200 mg total) by mouth 3 (three) times daily as needed for cough. 09/22/20   [name], MD  cefTRIAXone 2 g in sodium chloride 0.9 % 100 mL Inject 2 g into the vein daily for 2 days. 11/02/20 11/04/20  Mercy Riding, MD  chlorpheniramine-HYDROcodone (TUSSIONEX) 10-8 MG/5ML SUER Take 5 mLs by mouth every 12 (twelve) hours as needed for cough. 10/16/20   Florencia Reasons, MD  diltiazem (CARDIZEM) 30 MG tablet Take 1 tablet (30 mg total) by mouth every 8 (eight) hours. Patient taking differently: Take 30 mg by mouth 2 (two) times daily. 10/16/20   Florencia Reasons, MD  esomeprazole (NEXIUM) 40 MG capsule Take 1 capsule (40 mg total) by mouth daily. 09/22/20   [name], MD  ipratropium (ATROVENT) 0.02 % nebulizer solution Take 2.5 mLs (0.5 mg total) by nebulization every 4 (four) hours as needed for wheezing or shortness of breath (cough). 10/16/20   Florencia Reasons, MD  levothyroxine (EUTHYROX) 50 MCG tablet TAKE 1 TABLET BY MOUTH ONCE DAILY 30  MINUTES  PRIOR  TO  EATING; 09/11/20   [name], MD  potassium chloride SA (KLOR-CON) 20 MEQ tablet Take 20 mEq by mouth daily.  10/26/20   [provider]  XARELTO 15 MG TABS tablet TAKE 1 TABLET EVERY DAY 08/13/20   [name], MD     Vital Signs: BP 122/77   Pulse (!) 102   Temp 97.7 F (36.5 C) (Oral)   Resp (!) 26   Wt 95 lb 3.8 oz (43.2 kg)   SpO2 97%   BMI 16.87 kg/m   Physical Exam Vitals and nursing note reviewed.  Constitutional:      Appearance: She is well-developed.  HENT:     Head: Normocephalic and atraumatic.  Eyes:     Conjunctiva/sclera: Conjunctivae normal.  Pulmonary:     Effort: Pulmonary effort is normal.  Musculoskeletal:     Cervical back: Normal range of motion.  Neurological:     Mental Status: She is alert and oriented to person, place, and time.     Comments: Alert, aware and oriented X 3 Speech and comprehension is intact.   Mild facial droop noted Tongue midline Can spontaneously move all 4 extremities. Hand grip strength equal bilaterally.  Speech, cognition and language  are generally intact.  Comprehension and fluency are normal.  Judgment and insight normal  Slight RUE pronator drift. Gait not assessed Romberg not assessed Heel to toe not assessed Distal pulses not assessed    Imaging: DG Chest 2 View  Result Date: 10/31/2020 CLINICAL DATA:  Shortness of breath, weakness EXAM: CHEST - 2 VIEW COMPARISON:  10/30/2020 FINDINGS: No significant change in chest radiographs with gross cardiomegaly and a small left pleural effusion with associated atelectasis or consolidation. No new airspace opacity. Disc degenerative disease of the thoracic spine. IMPRESSION: No significant change in chest radiographs with gross cardiomegaly and a small left pleural effusion with associated atelectasis or consolidation. No new airspace opacity. Electronically Signed   By: Eddie Candle M.D.   On: 10/31/2020 11:30   MR ANGIO HEAD WO CONTRAST  Result Date: 11/02/2020 CLINICAL DATA:  Stroke follow-up EXAM: MRI HEAD WITHOUT CONTRAST MRA HEAD WITHOUT CONTRAST TECHNIQUE:  Multiplanar, multi-echo pulse sequences of the brain and surrounding structures were acquired without intravenous contrast. Angiographic images of the Circle of Willis were acquired using MRA technique without intravenous contrast. COMPARISON:  CTA from yesterday FINDINGS: MRI HEAD FINDINGS Brain: Tiny acute infarcts at the left basal ganglia. No acute hemorrhage, hydrocephalus, collection, or masslike finding age normal brain volume Vascular: See below Skull and upper cervical spine: Normal marrow signal Sinuses/Orbits: Partial right mastoid opacification with negative nasopharynx. Bilateral cataract resection. MRA HEAD FINDINGS Anterior circulation: Patent carotid, M1, A1 segments. No evidence of branch occlusion Posterior circulation: Vertebral, basilar, and proximal posterior cerebral arteries are patent without evidence of branch occlusion. Anatomic variants: None significant Extensive motion artifact which could easily obscure pathology. IMPRESSION: 1. Tiny acute infarcts at the left basal ganglia. 2. The left MCA remains patent. 3. Significant motion artifact. Electronically Signed   By: Jorje Guild M.D.   On: 11/02/2020 06:49   MR BRAIN WO CONTRAST  Result Date: 11/02/2020 CLINICAL DATA:  Stroke follow-up EXAM: MRI HEAD WITHOUT CONTRAST MRA HEAD WITHOUT CONTRAST TECHNIQUE: Multiplanar, multi-echo pulse sequences of the brain and surrounding structures were acquired without intravenous contrast. Angiographic images of the Circle of Willis were acquired using MRA technique without intravenous contrast. COMPARISON:  CTA from yesterday FINDINGS: MRI HEAD FINDINGS Brain: Tiny acute infarcts at the left basal ganglia. No acute hemorrhage, hydrocephalus, collection, or masslike finding age normal brain volume Vascular: See below Skull and upper cervical spine: Normal marrow signal Sinuses/Orbits: Partial right mastoid opacification with negative nasopharynx. Bilateral cataract resection. MRA HEAD FINDINGS  Anterior circulation: Patent carotid, M1, A1 segments. No evidence of branch occlusion Posterior circulation: Vertebral, basilar, and proximal posterior cerebral arteries are patent without evidence of branch occlusion. Anatomic variants: None significant Extensive motion artifact which could easily obscure pathology. IMPRESSION: 1. Tiny acute infarcts at the left basal ganglia. 2. The left MCA remains patent. 3. Significant motion artifact. Electronically Signed   By: Jorje Guild M.D.   On: 11/02/2020 06:49   IR CT Head Ltd  Result Date: 11/02/2020 INDICATION: 87 year old female with acute left-sided ICA terminus occlusion, presents for angiogram and mechanical thrombectomy EXAM: ULTRASOUND-GUIDED ACCESS RIGHT COMMON FEMORAL ARTERY CERVICAL AND CEREBRAL ANGIOGRAM MECHANICAL THROMBECTOMY LEFT ICA/MCA SPIN CT ANGIO-SEAL FOR HEMOSTASIS COMPARISON:  CT imaging same day MEDICATIONS: None ANESTHESIA/SEDATION: The anesthesia team was present to provide general endotracheal tube anesthesia and for patient monitoring during the procedure. Intubation was performed in room 2, neuro biplane room. Left radial arterial line was performed by the anesthesia team. Interventional neuro radiology nursing staff was also present. CONTRAST:  64 cc FLUOROSCOPY TIME:  Fluoroscopy Time: 15 minutes 12 seconds (793.6 mGy). COMPLICATIONS: None TECHNIQUE: Informed written consent was obtained from the patient's family after a thorough discussion of the procedural risks, benefits and alternatives. Specific risks discussed include: Bleeding, infection, contrast reaction, kidney  injury/failure, need for further procedure/surgery, arterial injury or dissection, embolization to new territory, intracranial hemorrhage (10-15% risk), neurologic deterioration, cardiopulmonary collapse, death. All questions were addressed. Maximal Sterile Barrier Technique was utilized including during the procedure including caps, mask, sterile gowns, sterile  gloves, sterile drape, hand hygiene and skin antiseptic. A timeout was performed prior to the initiation of the procedure. The anesthesia team was present to provide general endotracheal tube anesthesia and for patient monitoring during the procedure. Interventional neuro radiology nursing staff was also present. FINDINGS: Initial Findings: Aortic arch is 3 vessel arch, type 3. Left common carotid artery:  Normal course caliber and contour. Left external carotid artery: Patent with antegrade flow. Left internal carotid artery: Significant accordion appearance of the cervical ICA in the proximal and mid segment, less prominent at the skull base, compatible with fibromuscular dysplasia. There is slow flow through the distal cervical segment, including the horizontal and vertical petrous segment, the lateral segment, cavernous segment and supra ophthalmic segment. There is a meniscus occlusion before the terminus with no flow into either the ACA or the MCA. Left MCA:  No flow into the MCA on the initial. Left ACA:  No flow into the ACA on the initial. Completion Findings: Left MCA: Complete restoration of flow through the MCA after the second pass. No significant posterior cerebral artery, with patent anterior choroidal artery. The left temporal lobe perfused by the PCA, with the preoperative CT demonstrating filling through the temporal lobe. TICI 3: Complete perfusion of the territory Flat panel CT: No hemorrhage identified. PROCEDURE: The anesthesia team was present to provide general endotracheal tube anesthesia and for patient monitoring during the procedure. Intubation was performed in negative pressure [place] in neuro IR holding. Interventional neuro radiology nursing staff was also present. Ultrasound survey of the right inguinal region was performed with images stored and sent to PACs. 11 blade scalpel was used to make a small incision. Blunt dissection was performed with US guidance. A micropuncture needle was  used access the right common femoral artery under ultrasound. With excellent arterial blood flow returned, an .018 micro wire was passed through the needle, observed to enter the abdominal aorta under fluoroscopy. The needle was removed, and a micropuncture sheath was placed over the wire. The inner dilator and wire were removed, and an 035 wire was advanced under fluoroscopy into the abdominal aorta. The sheath was removed and a 25cm 62F straight vascular sheath was placed. The dilator was removed and the sheath was flushed. Sheath was attached to pressurized and heparinized saline bag for constant forward flow. A David catheter was then advanced over the 035 wire to the proximal descending thoracic aorta. The wire was then removed and the David catheter was double flushed. David catheter was then used to select the origin of the left common carotid artery. A standard Glidewire was then advanced into the cervical ICA. David catheter was then advanced into the distal segment of the cervical ICA. Glidewire was removed, catheter was double flush, and angiogram confirmed location. A rose in wire was then passed into the distal cervical segment and the David catheter was removed. Coaxial system was then advanced over the [employer] wire. This included a 95cm 087 "Walrus" balloon guide with coaxial 100 cm Davis diagnostic catheter. Once the coaxial system was into the distal cervical segment, the David catheter and the [facility] an wire were removed from the balloon guide with adequate flush at the hub of the balloon guide. Double flush was performed. Formal angiogram was  performed. Road map function was used once the occluded vessel was identified. Copious back flush was performed and the balloon catheter was attached to heparinized and pressurized saline bag for forward flow. A second coaxial system was then advanced through the balloon catheter, which included the selected intermediate catheter, microcatheter, and microwire. In  this scenario, the set up included a zoom 71 aspiration catheter, a Trevo Provue18 microcatheter, and 014 synchro soft wire. This system was advanced through the balloon guide catheter under the road-map function, with adequate back-flush at the rotating hemostatic valve at that back end of the balloon guide. Microcatheter and the intermediate catheter system were advanced through the carotid siphon to the level of the occlusion. Some manipulation was required around the ophthalmic artery origin, as withdrawal of the zoom catheter was performed with manual rotation to attempt a more suitable configuration at the ophthalmic artery origin. Once the zoom catheter was positioned proximal to the occlusion and beyond the ophthalmic artery, the microwire and microcatheter were removed. Rotating hemostatic valve was removed. The proprietary engine was then attached to the hub of the zoom catheter, with aspiration initiated as the first pass. With blood return confirmed the catheter was gently advanced into the site of the occlusion and passed into the proximal M1 segment. We observed approximately 60 seconds of aspiration time. Before withdrawing the aspiration catheter into the balloon guide, the balloon on the tip of the balloon guide was gently inflated under fluoroscopy. We then withdrew the aspiration catheter into the balloon guide confirmed free flow of blood return from the zoom catheter, we withdrew the aspiration catheter and then confirmed free flow of blood at the hub of the balloon guide catheter. Balloon was deflated and then angiogram was performed. This restored flow through the terminus segment into the A1 segment. There was migration of embolus into the proximal M1 segment, with no flow through the proximal M1. We elected a second pass. The same coaxial system was then advanced through the balloon catheter, which included the zoom 71 aspiration catheter, a Trevo Provue18 microcatheter, and 014 synchro soft  wire. This system was advanced through the balloon guide catheter under the road-map function, with adequate back-flush at the rotating hemostatic valve at that back end of the balloon guide. The microwire and microcatheter were navigated with a looped microwire configuration through the terminus segment into the MCA. The microwire was passed beyond the site of the occlusion, with the microcatheter gently pushed through over the microwire into the distal M1 segment to the insular region. Zoom catheter was then positioned in a location at the terminus directed towards the MCA. Microwire was gently removed from the microcatheter with adequate flush at the hub. Blood was then aspirated through the hub of the microcatheter, and a gentle contrast injection was performed confirming intraluminal position. A rotating hemostatic valve was then attached to the back end of the microcatheter, and a pressurized and heparinized saline bag was attached to the catheter. 4 x 40 solitaire device was then selected. Back flush was achieved at the rotating hemostatic valve, and then the device was gently advanced through the microcatheter to the distal end. The retriever was then unsheathed by withdrawing the microcatheter under fluoroscopy. Once the retriever was completely unsheathed, the microcatheter was carefully stripped from the delivery device. Control angiogram was performed from the intermediate catheter. A 3 minute time interval was observed. The balloon at the balloon guide catheter was then inflated under fluoroscopy for proximal flow arrest. Constant aspiration using  the proprietary engine was then performed at the zoom 71 catheter, which was slowly advanced until stasis of blood flow was observed. The retriever was then gently and slowly withdrawn with fluoroscopic observation. Once the retriever was "corked" within the tip of the intermediate catheter, both were removed from the system. Free aspiration was confirmed at  the hub of the balloon guide catheter, with free blood return confirmed. The balloon was then deflated, and a control angiogram was performed. Restoration of flow was confirmed. Angiogram of the cervical ICA was performed. Balloon guide was then removed. The skin at the puncture site was then cleaned with Chlorhexidine. The 8 French sheath was removed and an 41F angioseal was deployed. Flat panel CT was performed. Patient tolerated the procedure well and remained hemodynamically stable throughout. No complications were encountered and no significant blood loss encountered. IMPRESSION: Status post ultrasound guided access right common femoral artery for left-sided cervical/cerebral angiogram and treatment of terminus/MCA occlusion with 2 passes of aspiration thrombectomy/mechanical thrombectomy, achieving TICI 3 flow. Angio-Seal for hemostasis. Angiogram reveals significant fibromuscular dysplasia (FMD) of the left cervical ICA. Signed, Dulcy Fanny. Dellia Nims, [facility] Radiology PLAN: The patient will remain intubated. ICU status Target systolic blood pressure of 120-140 Right hip straight time 4 hours Frequent neurovascular checks Repeat neurologic imaging with CT and/MRI at the discretion of neurology team Electronically Signed   By: Corrie Mckusick D.O.   On: 11/02/2020 08:32   IR US Guide Vasc Access Right  Result Date: 11/02/2020 INDICATION: 87 year old female with acute left-sided ICA terminus occlusion, presents for angiogram and mechanical thrombectomy EXAM: ULTRASOUND-GUIDED ACCESS RIGHT COMMON FEMORAL ARTERY CERVICAL AND CEREBRAL ANGIOGRAM MECHANICAL THROMBECTOMY LEFT ICA/MCA SPIN CT ANGIO-SEAL FOR HEMOSTASIS COMPARISON:  CT imaging same day MEDICATIONS: None ANESTHESIA/SEDATION: The anesthesia team was present to provide general endotracheal tube anesthesia and for patient monitoring during the procedure. Intubation was performed in room 2, neuro  biplane room. Left radial arterial line was performed by the anesthesia team. Interventional neuro radiology nursing staff was also present. CONTRAST:  64 cc FLUOROSCOPY TIME:  Fluoroscopy Time: 15 minutes 12 seconds (793.6 mGy). COMPLICATIONS: None TECHNIQUE: Informed written consent was obtained from the patient's family after a thorough discussion of the procedural risks, benefits and alternatives. Specific risks discussed include: Bleeding, infection, contrast reaction, kidney injury/failure, need for further procedure/surgery, arterial injury or dissection, embolization to new territory, intracranial hemorrhage (10-15% risk), neurologic deterioration, cardiopulmonary collapse, death. All questions were addressed. Maximal Sterile Barrier Technique was utilized including during the procedure including caps, mask, sterile gowns, sterile gloves, sterile drape, hand hygiene and skin antiseptic. A timeout was performed prior to the initiation of the procedure. The anesthesia team was present to provide general endotracheal tube anesthesia and for patient monitoring during the procedure. Interventional neuro radiology nursing staff was also present. FINDINGS: Initial Findings: Aortic arch is 3 vessel arch, type 3. Left common carotid artery:  Normal course caliber and contour. Left external carotid artery: Patent with antegrade flow. Left internal carotid artery: Significant accordion appearance of the cervical ICA in the proximal and mid segment, less prominent at the skull base, compatible with fibromuscular dysplasia. There is slow flow through the distal cervical segment, including the horizontal and vertical petrous segment, the lateral segment, cavernous segment and supra ophthalmic segment. There is a meniscus occlusion before the terminus with no flow into either the ACA or the MCA. Left MCA:  No flow into the MCA on the initial. Left ACA:  No flow into the ACA on the initial. Completion Findings: Left MCA:  Complete restoration of flow through the MCA after the second pass. No significant posterior cerebral artery, with patent anterior choroidal artery. The left temporal lobe perfused by the PCA, with the preoperative CT demonstrating filling through the temporal lobe. TICI 3: Complete perfusion of the territory Flat panel CT: No hemorrhage identified. PROCEDURE: The anesthesia team was present to provide general endotracheal tube anesthesia and for patient monitoring during the procedure. Intubation was performed in negative pressure [place] in neuro IR holding. Interventional neuro radiology nursing staff was also present. Ultrasound survey of the right inguinal region was performed with images stored and sent to PACs. 11 blade scalpel was used to make a small incision. Blunt dissection was performed with US guidance. A micropuncture needle was used access the right common femoral artery under ultrasound. With excellent arterial blood flow returned, an .018 micro wire was passed through the needle, observed to enter the abdominal aorta under fluoroscopy. The needle was removed, and a micropuncture sheath was placed over the wire. The inner dilator and wire were removed, and an 035 wire was advanced under fluoroscopy into the abdominal aorta. The sheath was removed and a 25cm 67F straight vascular sheath was placed. The dilator was removed and the sheath was flushed. Sheath was attached to pressurized and heparinized saline bag for constant forward flow. A David catheter was then advanced over the 035 wire to the proximal descending thoracic aorta. The wire was then removed and the David catheter was double flushed. David catheter was then used to select the origin of the left common carotid artery. A standard Glidewire was then advanced into the cervical ICA. David catheter was then advanced into the distal segment of the cervical ICA. Glidewire was removed, catheter was double flush, and angiogram confirmed location. A  rose in wire was then passed into the distal cervical segment and the David catheter was removed. Coaxial system was then advanced over the [employer] wire. This included a 95cm 087 "Walrus" balloon guide with coaxial 100 cm Davis diagnostic catheter. Once the coaxial system was into the distal cervical segment, the David catheter and the [facility] an wire were removed from the balloon guide with adequate flush at the hub of the balloon guide. Double flush was performed. Formal angiogram was performed. Road map function was used once the occluded vessel was identified. Copious back flush was performed and the balloon catheter was attached to heparinized and pressurized saline bag for forward flow. A second coaxial system was then advanced through the balloon catheter, which included the selected intermediate catheter, microcatheter, and microwire. In this scenario, the set up included a zoom 71 aspiration catheter, a Trevo Provue18 microcatheter, and 014 synchro soft wire. This system was advanced through the balloon guide catheter under the road-map function, with adequate back-flush at the rotating hemostatic valve at that back end of the balloon guide. Microcatheter and the intermediate catheter system were advanced through the carotid siphon to the level of the occlusion. Some manipulation was required around the ophthalmic artery origin, as withdrawal of the zoom catheter was performed with manual rotation to attempt a more suitable configuration at the ophthalmic artery origin. Once the zoom catheter was positioned proximal to the occlusion and beyond the ophthalmic artery, the microwire and microcatheter were removed. Rotating hemostatic valve was removed. The proprietary engine was then attached to the hub of the zoom catheter, with aspiration initiated as the first pass. With blood return  confirmed the catheter was gently advanced into the site of the occlusion and passed into the proximal M1 segment. We observed  approximately 60 seconds of aspiration time. Before withdrawing the aspiration catheter into the balloon guide, the balloon on the tip of the balloon guide was gently inflated under fluoroscopy. We then withdrew the aspiration catheter into the balloon guide confirmed free flow of blood return from the zoom catheter, we withdrew the aspiration catheter and then confirmed free flow of blood at the hub of the balloon guide catheter. Balloon was deflated and then angiogram was performed. This restored flow through the terminus segment into the A1 segment. There was migration of embolus into the proximal M1 segment, with no flow through the proximal M1. We elected a second pass. The same coaxial system was then advanced through the balloon catheter, which included the zoom 71 aspiration catheter, a Trevo Provue18 microcatheter, and 014 synchro soft wire. This system was advanced through the balloon guide catheter under the road-map function, with adequate back-flush at the rotating hemostatic valve at that back end of the balloon guide. The microwire and microcatheter were navigated with a looped microwire configuration through the terminus segment into the MCA. The microwire was passed beyond the site of the occlusion, with the microcatheter gently pushed through over the microwire into the distal M1 segment to the insular region. Zoom catheter was then positioned in a location at the terminus directed towards the MCA. Microwire was gently removed from the microcatheter with adequate flush at the hub. Blood was then aspirated through the hub of the microcatheter, and a gentle contrast injection was performed confirming intraluminal position. A rotating hemostatic valve was then attached to the back end of the microcatheter, and a pressurized and heparinized saline bag was attached to the catheter. 4 x 40 solitaire device was then selected. Back flush was achieved at the rotating hemostatic valve, and then the device  was gently advanced through the microcatheter to the distal end. The retriever was then unsheathed by withdrawing the microcatheter under fluoroscopy. Once the retriever was completely unsheathed, the microcatheter was carefully stripped from the delivery device. Control angiogram was performed from the intermediate catheter. A 3 minute time interval was observed. The balloon at the balloon guide catheter was then inflated under fluoroscopy for proximal flow arrest. Constant aspiration using the proprietary engine was then performed at the zoom 71 catheter, which was slowly advanced until stasis of blood flow was observed. The retriever was then gently and slowly withdrawn with fluoroscopic observation. Once the retriever was "corked" within the tip of the intermediate catheter, both were removed from the system. Free aspiration was confirmed at the hub of the balloon guide catheter, with free blood return confirmed. The balloon was then deflated, and a control angiogram was performed. Restoration of flow was confirmed. Angiogram of the cervical ICA was performed. Balloon guide was then removed. The skin at the puncture site was then cleaned with Chlorhexidine. The 8 French sheath was removed and an 74F angioseal was deployed. Flat panel CT was performed. Patient tolerated the procedure well and remained hemodynamically stable throughout. No complications were encountered and no significant blood loss encountered. IMPRESSION: Status post ultrasound guided access right common femoral artery for left-sided cervical/cerebral angiogram and treatment of terminus/MCA occlusion with 2 passes of aspiration thrombectomy/mechanical thrombectomy, achieving TICI 3 flow. Angio-Seal for hemostasis. Angiogram reveals significant fibromuscular dysplasia (FMD) of the left cervical ICA. Signed, Dulcy Fanny. Dellia Nims, [place] [facility]  Radiology PLAN: The patient will remain intubated. ICU  status Target systolic blood pressure of 120-140 Right hip straight time 4 hours Frequent neurovascular checks Repeat neurologic imaging with CT and/MRI at the discretion of neurology team Electronically Signed   By: Corrie Mckusick D.O.   On: 11/02/2020 08:32   DG Chest Port 1 View  Result Date: 11/01/2020 CLINICAL DATA:  Endotracheal tube EXAM: PORTABLE CHEST 1 VIEW COMPARISON:  10/31/2020 FINDINGS: Small left pleural effusion and basilar atelectasis. Normal cardiomediastinal contours. Unchanged chronic interstitial prominence. IMPRESSION: Small left pleural effusion and basilar atelectasis, unchanged. Electronically Signed   By: Ulyses Jarred M.D.   On: 11/01/2020 22:12   ECHOCARDIOGRAM COMPLETE  Result Date: 11/02/2020    ECHOCARDIOGRAM REPORT   Patient Name:   CALYPSO HAGARTY Date of Exam: 11/01/2020 Medical Rec #:  [id number]        Height:       63.0 in Accession #:    [id number]       Weight:       95.2 lb Date of Birth:  07-Feb-1931        BSA:          1.411 m Patient Age:    6 years         BP:           97/61 mmHg Patient Gender: F                HR:           95 bpm. Exam Location:  [facility] Procedure: 2D Echo, Cardiac Doppler and Color Doppler Indications:     Dyspnea R06.00  History:         Patient has prior history of Echocardiogram examinations. Risk                  Factors:Hypertension. MR, SVT.  Sonographer:     Alyse Low Roar Referring Phys:  [place] Diagnosing Phys: Isaias Cowman MD IMPRESSIONS  1. Left ventricular ejection fraction, by estimation, is 40 to 45%. The left ventricle has mildly decreased function. The left ventricle has no regional wall motion abnormalities. Left ventricular diastolic parameters are indeterminate.  2. Right ventricular systolic function is normal. The right ventricular size is normal.  3. Left atrial size was moderately dilated.  4. Right atrial size was moderately dilated.  5. The mitral valve is normal in structure. Moderate to severe mitral  valve regurgitation. No evidence of mitral stenosis.  6. Tricuspid valve regurgitation is moderate.  7. The aortic valve is normal in structure. Aortic valve regurgitation is trivial. No aortic stenosis is present.  8. The inferior vena cava is normal in size with greater than 50% respiratory variability, suggesting right atrial pressure of 3 mmHg. FINDINGS  Left Ventricle: Left ventricular ejection fraction, by estimation, is 40 to 45%. The left ventricle has mildly decreased function. The left ventricle has no regional wall motion abnormalities. The left ventricular internal cavity size was normal in size. There is no left ventricular hypertrophy. Left ventricular diastolic parameters are indeterminate. Right Ventricle: The right ventricular size is normal. No increase in right ventricular wall thickness. Right ventricular systolic function is normal. Left Atrium: Left atrial size was moderately dilated. Right Atrium: Right atrial size was moderately dilated. Pericardium: There is no evidence of pericardial effusion. Mitral Valve: The mitral valve is normal in structure. Moderate to severe mitral valve regurgitation. No evidence of mitral valve stenosis. MV peak gradient, 17.1 mmHg. The  mean mitral valve gradient is 6.0 mmHg. Tricuspid Valve: The tricuspid valve is normal in structure. Tricuspid valve regurgitation is moderate . No evidence of tricuspid stenosis. Aortic Valve: The aortic valve is normal in structure. Aortic valve regurgitation is trivial. No aortic stenosis is present. Aortic valve peak gradient measures 2.9 mmHg. Pulmonic Valve: The pulmonic valve was normal in structure. Pulmonic valve regurgitation is not visualized. No evidence of pulmonic stenosis. Aorta: The aortic root is normal in size and structure. Venous: The inferior vena cava is normal in size with greater than 50% respiratory variability, suggesting right atrial pressure of 3 mmHg. IAS/Shunts: No atrial level shunt detected by color  flow Doppler.  LEFT VENTRICLE PLAX 2D LVIDd:         3.70 cm  Diastology LVIDs:         3.00 cm  LV e' medial:    3.48 cm/s LV PW:         1.30 cm  LV E/e' medial:  61.8 LV IVS:        1.20 cm  LV e' lateral:   4.24 cm/s LVOT diam:     2.00 cm  LV E/e' lateral: 50.7 LV SV:         30 LV SV Index:   21 LVOT Area:     3.14 cm  RIGHT VENTRICLE RV Basal diam:  3.60 cm RV Mid diam:    2.70 cm RV S prime:     10.20 cm/s TAPSE (M-mode): 1.2 cm LEFT ATRIUM              Index       RIGHT ATRIUM           Index LA diam:        4.55 cm  3.23 cm/m  RA Area:     20.70 cm LA Vol (A2C):   105.0 ml 74.43 ml/m RA Volume:   57.60 ml  40.83 ml/m LA Vol (A4C):   67.0 ml  47.49 ml/m LA Biplane Vol: 86.5 ml  61.32 ml/m  AORTIC VALVE                PULMONIC VALVE AV Area (Vmax): 2.12 cm    PV Vmax:          0.58 m/s AV Vmax:        84.70 cm/s  PV Peak grad:     1.4 mmHg AV Peak Grad:   2.9 mmHg    PR End Diast Vel: 6.35 msec LVOT Vmax:      57.05 cm/s  RVOT Peak grad:   1 mmHg LVOT Vmean:     41.600 cm/s LVOT VTI:       0.096 m  AORTA Ao Root diam: 2.90 cm MITRAL VALVE                TRICUSPID VALVE MV Area (PHT): 4.49 cm     TR Peak grad:   56.6 mmHg MV Area VTI:   0.62 cm     TR Vmax:        376.00 cm/s MV Peak grad:  17.1 mmHg MV Mean grad:  6.0 mmHg     SHUNTS MV Vmax:       2.07 m/s     Systemic VTI:  0.10 m MV Vmean:      112.0 cm/s   Systemic Diam: 2.00 cm MV Decel Time: 169 msec MV E velocity: 215.00 cm/s MV A Prime:    3.3 cm/s Sheppard Coil  Paraschos MD Electronically signed by Isaias Cowman MD Signature Date/Time: 11/02/2020/9:18:05 AM    Final    IR PERCUTANEOUS ART THROMBECTOMY/INFUSION INTRACRANIAL INC DIAG ANGIO  Result Date: 11/02/2020 INDICATION: 87 year old female with acute left-sided ICA terminus occlusion, presents for angiogram and mechanical thrombectomy EXAM: ULTRASOUND-GUIDED ACCESS RIGHT COMMON FEMORAL ARTERY CERVICAL AND CEREBRAL ANGIOGRAM MECHANICAL THROMBECTOMY LEFT ICA/MCA SPIN CT ANGIO-SEAL  FOR HEMOSTASIS COMPARISON:  CT imaging same day MEDICATIONS: None ANESTHESIA/SEDATION: The anesthesia team was present to provide general endotracheal tube anesthesia and for patient monitoring during the procedure. Intubation was performed in room 2, neuro biplane room. Left radial arterial line was performed by the anesthesia team. Interventional neuro radiology nursing staff was also present. CONTRAST:  64 cc FLUOROSCOPY TIME:  Fluoroscopy Time: 15 minutes 12 seconds (793.6 mGy). COMPLICATIONS: None TECHNIQUE: Informed written consent was obtained from the patient's family after a thorough discussion of the procedural risks, benefits and alternatives. Specific risks discussed include: Bleeding, infection, contrast reaction, kidney injury/failure, need for further procedure/surgery, arterial injury or dissection, embolization to new territory, intracranial hemorrhage (10-15% risk), neurologic deterioration, cardiopulmonary collapse, death. All questions were addressed. Maximal Sterile Barrier Technique was utilized including during the procedure including caps, mask, sterile gowns, sterile gloves, sterile drape, hand hygiene and skin antiseptic. A timeout was performed prior to the initiation of the procedure. The anesthesia team was present to provide general endotracheal tube anesthesia and for patient monitoring during the procedure. Interventional neuro radiology nursing staff was also present. FINDINGS: Initial Findings: Aortic arch is 3 vessel arch, type 3. Left common carotid artery:  Normal course caliber and contour. Left external carotid artery: Patent with antegrade flow. Left internal carotid artery: Significant accordion appearance of the cervical ICA in the proximal and mid segment, less prominent at the skull base, compatible with fibromuscular dysplasia. There is slow flow through the distal cervical segment, including the horizontal and vertical petrous segment, the lateral segment, cavernous  segment and supra ophthalmic segment. There is a meniscus occlusion before the terminus with no flow into either the ACA or the MCA. Left MCA:  No flow into the MCA on the initial. Left ACA:  No flow into the ACA on the initial. Completion Findings: Left MCA: Complete restoration of flow through the MCA after the second pass. No significant posterior cerebral artery, with patent anterior choroidal artery. The left temporal lobe perfused by the PCA, with the preoperative CT demonstrating filling through the temporal lobe. TICI 3: Complete perfusion of the territory Flat panel CT: No hemorrhage identified. PROCEDURE: The anesthesia team was present to provide general endotracheal tube anesthesia and for patient monitoring during the procedure. Intubation was performed in negative pressure [place] in neuro IR holding. Interventional neuro radiology nursing staff was also present. Ultrasound survey of the right inguinal region was performed with images stored and sent to PACs. 11 blade scalpel was used to make a small incision. Blunt dissection was performed with US guidance. A micropuncture needle was used access the right common femoral artery under ultrasound. With excellent arterial blood flow returned, an .018 micro wire was passed through the needle, observed to enter the abdominal aorta under fluoroscopy. The needle was removed, and a micropuncture sheath was placed over the wire. The inner dilator and wire were removed, and an 035 wire was advanced under fluoroscopy into the abdominal aorta. The sheath was removed and a 25cm 33F straight vascular sheath was placed. The dilator was removed and the sheath was flushed. Sheath was attached to pressurized and  heparinized saline bag for constant forward flow. A David catheter was then advanced over the 035 wire to the proximal descending thoracic aorta. The wire was then removed and the David catheter was double flushed. David catheter was then used to select the origin of  the left common carotid artery. A standard Glidewire was then advanced into the cervical ICA. David catheter was then advanced into the distal segment of the cervical ICA. Glidewire was removed, catheter was double flush, and angiogram confirmed location. A rose in wire was then passed into the distal cervical segment and the David catheter was removed. Coaxial system was then advanced over the [employer] wire. This included a 95cm 087 "[place]" balloon guide with coaxial 100 cm Davis diagnostic catheter. Once the coaxial system was into the distal cervical segment, the David catheter and the [facility] an wire were removed from the balloon guide with adequate flush at the hub of the balloon guide. Double flush was performed. Formal angiogram was performed. Road map function was used once the occluded vessel was identified. Copious back flush was performed and the balloon catheter was attached to heparinized and pressurized saline bag for forward flow. A second coaxial system was then advanced through the balloon catheter, which included the selected intermediate catheter, microcatheter, and microwire. In this scenario, the set up included a zoom 71 aspiration catheter, a Trevo Provue18 microcatheter, and 014 synchro soft wire. This system was advanced through the balloon guide catheter under the road-map function, with adequate back-flush at the rotating hemostatic valve at that back end of the balloon guide. Microcatheter and the intermediate catheter system were advanced through the carotid siphon to the level of the occlusion. Some manipulation was required around the ophthalmic artery origin, as withdrawal of the zoom catheter was performed with manual rotation to attempt a more suitable configuration at the ophthalmic artery origin. Once the zoom catheter was positioned proximal to the occlusion and beyond the ophthalmic artery, the microwire and microcatheter were removed. Rotating hemostatic valve was removed. The  proprietary engine was then attached to the hub of the zoom catheter, with aspiration initiated as the first pass. With blood return confirmed the catheter was gently advanced into the site of the occlusion and passed into the proximal M1 segment. We observed approximately 60 seconds of aspiration time. Before withdrawing the aspiration catheter into the balloon guide, the balloon on the tip of the balloon guide was gently inflated under fluoroscopy. We then withdrew the aspiration catheter into the balloon guide confirmed free flow of blood return from the zoom catheter, we withdrew the aspiration catheter and then confirmed free flow of blood at the hub of the balloon guide catheter. Balloon was deflated and then angiogram was performed. This restored flow through the terminus segment into the A1 segment. There was migration of embolus into the proximal M1 segment, with no flow through the proximal M1. We elected a second pass. The same coaxial system was then advanced through the balloon catheter, which included the zoom 71 aspiration catheter, a Trevo Provue18 microcatheter, and 014 synchro soft wire. This system was advanced through the balloon guide catheter under the road-map function, with adequate back-flush at the rotating hemostatic valve at that back end of the balloon guide. The microwire and microcatheter were navigated with a looped microwire configuration through the terminus segment into the MCA. The microwire was passed beyond the site of the occlusion, with the microcatheter gently pushed through over the microwire into the distal M1 segment to the  insular region. Zoom catheter was then positioned in a location at the terminus directed towards the MCA. Microwire was gently removed from the microcatheter with adequate flush at the hub. Blood was then aspirated through the hub of the microcatheter, and a gentle contrast injection was performed confirming intraluminal position. A rotating hemostatic  valve was then attached to the back end of the microcatheter, and a pressurized and heparinized saline bag was attached to the catheter. 4 x 40 solitaire device was then selected. Back flush was achieved at the rotating hemostatic valve, and then the device was gently advanced through the microcatheter to the distal end. The retriever was then unsheathed by withdrawing the microcatheter under fluoroscopy. Once the retriever was completely unsheathed, the microcatheter was carefully stripped from the delivery device. Control angiogram was performed from the intermediate catheter. A 3 minute time interval was observed. The balloon at the balloon guide catheter was then inflated under fluoroscopy for proximal flow arrest. Constant aspiration using the proprietary engine was then performed at the zoom 71 catheter, which was slowly advanced until stasis of blood flow was observed. The retriever was then gently and slowly withdrawn with fluoroscopic observation. Once the retriever was "corked" within the tip of the intermediate catheter, both were removed from the system. Free aspiration was confirmed at the hub of the balloon guide catheter, with free blood return confirmed. The balloon was then deflated, and a control angiogram was performed. Restoration of flow was confirmed. Angiogram of the cervical ICA was performed. Balloon guide was then removed. The skin at the puncture site was then cleaned with Chlorhexidine. The 8 French sheath was removed and an 45F angioseal was deployed. Flat panel CT was performed. Patient tolerated the procedure well and remained hemodynamically stable throughout. No complications were encountered and no significant blood loss encountered. IMPRESSION: Status post ultrasound guided access right common femoral artery for left-sided cervical/cerebral angiogram and treatment of terminus/MCA occlusion with 2 passes of aspiration thrombectomy/mechanical thrombectomy, achieving TICI 3 flow.  Angio-Seal for hemostasis. Angiogram reveals significant fibromuscular dysplasia (FMD) of the left cervical ICA. Signed, Dulcy Fanny. Dellia Nims, [place] [facility] Radiology PLAN: The patient will remain intubated. ICU status Target systolic blood pressure of 120-140 Right hip straight time 4 hours Frequent neurovascular checks Repeat neurologic imaging with CT and/MRI at the discretion of neurology team Electronically Signed   By: Corrie Mckusick D.O.   On: 11/02/2020 08:32   CT ANGIO HEAD NECK W WO CM (CODE STROKE)  Result Date: 11/01/2020 CLINICAL DATA:  Neuro deficit, acute, stroke suspected. Clinical concern for left MCA infarct. EXAM: CT ANGIOGRAPHY HEAD AND NECK TECHNIQUE: Multidetector CT imaging of the head and neck was performed using the standard protocol during bolus administration of intravenous contrast. Multiplanar CT image reconstructions and MIPs were obtained to evaluate the vascular anatomy. Carotid stenosis measurements (when applicable) are obtained utilizing NASCET criteria, using the distal internal carotid diameter as the denominator. CONTRAST:  23mL OMNIPAQUE IOHEXOL 350 MG/ML SOLN COMPARISON:  Head CT 03/09/2020. High-resolution chest CT 10/05/2020. FINDINGS: CT HEAD FINDINGS Brain: There is no evidence of an acute infarct, intracranial hemorrhage, mass, midline shift, or extra-axial fluid collection. Mild cerebral atrophy is within normal limits for age. Vascular: Calcified atherosclerosis at the skull base. Hyperdense left ICA terminus/proximal left MCA. Skull: No fracture or suspicious osseous lesion. Sinuses: Clear paranasal sinuses. Persistent moderate right mastoid effusion. Orbits: Bilateral cataract extraction. Review of the MIP images confirms the above findings CTA NECK FINDINGS  Aortic arch: Standard 3 vessel aortic arch with mild atherosclerotic plaque. Widely patent arch vessel origins. Right carotid system: Patent with minimal  calcified plaque at the carotid bifurcation. No evidence of a significant stenosis or dissection. Beading of the mid to distal cervical ICA. Left carotid system: The common carotid artery and bifurcation are widely patent and there is a normal appearance of the proximal most ICA, however there is progressively diminishing enhancement of the mid and distal portions of the cervical ICA with only faint enhancement at the skull base. A discrete dissection flap is not visible. Vertebral arteries: Patent and codominant without evidence of stenosis or dissection. Skeleton: Focally advanced cervical disc degeneration at C5-6 where there is grade 1 anterolisthesis. Other neck: No evidence of cervical lymphadenopathy or mass. Upper chest: Small bilateral pleural effusions. Bilateral ground-glass opacities with interlobular septal thickening, increased from the prior chest CT. Borderline enlarged mediastinal lymph nodes, potentially reactive. Review of the MIP images confirms the above findings CTA HEAD FINDINGS Anterior circulation: There is progressively diminishing faint opacification of the petrous and cavernous segments of the left ICA with absent opacification from the level of the anterior genu through the terminus. The left M1 segment is occluded proximally with reconstitution at the level of the MCA bifurcation although reconstituted MCA branches are mildly attenuated diffusely. The left A1 segment reconstitutes just beyond its origin. The right MCA and right ACA are patent without evidence of a significant proximal stenosis. No aneurysm is identified. Posterior circulation: Intracranial vertebral arteries are patent to the basilar. Patent PICA, AICA, and SCA origins are visualized bilaterally. The basilar artery is widely patent. Posterior communicating arteries are diminutive or absent. The PCAs are patent with mild multifocal P2 stenoses more notable on the left than the right. No aneurysm is identified. Venous  sinuses: Patent. Anatomic variants: None. Review of the MIP images confirms the above findings IMPRESSION: 1. Occlusion of the intracranial left ICA including the ICA terminus and left MCA and ACA origins. Reconstitution of the left MCA and ACA as above. 2. No visible acute infarct or intracranial hemorrhage. ASPECTS of 10. 3. Small pleural effusions and partially visualized bilateral lung opacities, nonspecific but could reflect edema or infection. 4.  Aortic Atherosclerosis (ICD10-I70.0). These results were called by telephone at the time of interpretation on 11/01/2020 at 6:40 pm to Dr. Su Monks, who verbally acknowledged these results. Electronically Signed   By: Logan Bores M.D.   On: 11/01/2020 19:01    Labs:  CBC: Recent Labs    10/31/20 0658 11/01/20 0532 11/01/20 2209 11/02/20 0509 11/03/20 0118  WBC 8.3 7.8  --  8.0 6.7  HGB 10.8* 10.3* 10.2* 9.0* 9.2*  HCT 33.4* 32.4* 30.0* 28.9* 29.5*  PLT 434* 396  --  377 332    COAGS: Recent Labs    10/15/20 1437  INR 3.0*    BMP: Recent Labs    10/31/20 0658 11/01/20 0532 11/01/20 2209 11/02/20 0509 11/03/20 0118  NA 137 138 138 136 139  K 3.6 4.7 3.5 3.7 3.6  CL 102 105  --  102 106  CO2 28 28  --  23 26  GLUCOSE 99 103*  --  85 86  BUN 18 18  --  11 12  CALCIUM 8.3* 8.4*  --  8.0* 8.1*  CREATININE 1.03* 1.01*  --  0.85 0.94  GFRNONAA 52* 54*  --  >60 58*    LIVER FUNCTION TESTS: Recent Labs    04/02/20 1021 06/08/20 1445  09/07/20 2307 10/30/20 0939 11/01/20 0532  BILITOT 1.0 0.7 0.8 1.8*  --   AST 58* 29 40 24  --   ALT 64* 30 31 15   --   ALKPHOS 90 105 106 89  --   PROT 6.8 6.7 6.4* 6.8  --   ALBUMIN 3.6 3.8 3.4* 3.3* 2.6*    Assessment and Plan:  87 y.o. female inpatient. History of HTN, a fib, PE, DVT (on xarelto). Admitted to [facility] for a fib with RVR with acote on chronic CHF. Hospital stay complicated on 7.51.70  by acute onset of global aphasia and high sided hemiplegia. Code stroke was called  and the Patient was transferred to [facility]. IR performed an ultrasound guided access right common femoral artery for left-sided cervical/cerebral angiogram and treatment of terminus/MCA occlusion with 2 passes of aspiration thrombectomy/mechanical thrombectomy, achieving TICI 3 flow. Patient is now extubated laying in bed  Alert, aware and oriented X 3 Speech and comprehension is intact.   Mild facial droop noted Tongue midline Can spontaneously move all 4 extremities. Hand grip strength equal bilaterally.  Speech, cognition and language  are generally intact.  Comprehension and fluency are normal.  Judgment and insight normal  Slight RUE pronator drift. Gait not assessed Romberg not assessed Heel to toe not assessed Distal pulses not assessed  Post Stroke MR brain reads 1. Tiny acute infarcts at the left basal ganglia. 2. The left MCA remains patent. 3. Significant motion artifact. All labs are in acceptable parameters. Per note from Dr Erlinda Hong patient to be discharged on [place].    Patient to stable from [facility] perscpective s/p cerebral angiogram with treatment of terminus/MCA occlusion further plans per Neurology Team please call IR with questions or concerns.  Electronically Signed: Jacqualine Mau, NP 11/03/2020, 2:30 PM   I spent a total of 15 Minutes at the the patient's bedside AND on the patient's hospital floor or unit, greater than 50% of which was counseling/coordinating care for left-sided cervical/cerebral angiogram and treatment of terminus/MCA occlusion with 2 passes of aspiration thrombectomy/mechanical thrombectomy, achieving TICI 3 flow

## 2020-11-03 NOTE — Progress Notes (Signed)
OT Evaluation - late entry  PTA pt lives at home with her grand daughter and was independent with ADL and mobility. Daughter assists with financial and medication management as needed. Recently stopped driving due to not feeling well. Pt able to mobilize to [facility] with Mod A with VSS. Demonstrates a functional decline, requiring mod A with ADL and mobility and would greatly benefit from intensive rehab at CIR to maximize functional level of independence and facilitate safe DC home with supportive family. Will follow acutely.    11/02/20 1602  OT Visit Information  Last OT Received On 11/02/20  Assistance Needed +1  History of Present Illness Pt presented to [facility] on 9/17 with SOB, weakness and heart palpitations. Pt adm for Afib with RVR and mild CHF exacerbation. On 9/18 around 1800 nurse witnessed rt sided weakness and aphasia. Code stroke called. CT showed lt ICA occlusion and pt transferred to [facility] for emergent mechanical thrombectomy 9/18.  PMH: HTN, Afib, PE, and GERD.  Precautions  Precautions Fall  Home Living  Family/patient expects to be discharged to: Private residence  Living Arrangements Other relatives  Available Help at Discharge Family;Available 24 hours/day  Type of Home House  Home Access Level entry  [place] or work area in basement;Able to live on main level with bedroom/bathroom  Bathroom Shower/Tub Walk-in Cytogeneticist No  [place] - 4 wheels;Shower seat;Grab bars - tub/shower  Additional Comments Reports not using rollator, does use grab bars and shower chair for safety.   Lives With Family  Prior Function  Level of Independence Independent  Gait / Transfers Assistance Needed reports ambulating short distances only.  ADL's / [place] to perform all I/ADLs. Has rarely driven lately in past 1-2 weeks d/t fatigue, but is able to drive at baseline. Family assists with laundry as it  is in the basement  Communication  Communication Expressive difficulties  Pain Assessment  Pain Assessment No/denies pain  Cognition  Arousal/Alertness Awake/alert  Behavior During Therapy WFL for tasks assessed/performed  Overall Cognitive Status Impaired/Different from baseline  Area of Impairment Attention;Following commands;Memory;Safety/judgement;Awareness;Problem solving  Current Attention Level Selective  Memory Decreased short-term memory  Following Commands Follows one step commands consistently  Safety/Judgement Decreased awareness of safety;Decreased awareness of deficits  Awareness Emergent  Problem Solving Slow processing  General Comments Will further assess; decreased awareness of R UE during mobility  Upper Extremity Assessment  Upper Extremity Assessment RUE deficits/detail  RUE Deficits / Details AROM overall WFL; generally weak @ 3+/5 throughout; attempitng to use funcitonally  however decreased in-hand manipulation skills and coordination; most likely imparied sensation a pt unaware she was rolling onto her hand  RUE Sensation decreased light touch;decreased proprioception  RUE Coordination decreased fine motor;decreased gross motor  Lower Extremity Assessment  Lower Extremity Assessment Defer to PT evaluation  Cervical / Trunk Assessment  Cervical / Trunk Assessment Kyphotic  ADL  Overall ADL's  Needs assistance/impaired  Eating/Feeding Minimal assistance  Grooming Moderate assistance  Upper Body Bathing Moderate assistance  Lower Body Bathing Moderate assistance  Upper Body Dressing  Moderate assistance  Lower Body Dressing Maximal assistance  Toilet Transfer Moderate assistance  Toileting- Clothing Manipulation and Hygiene Maximal assistance  Functional mobility during ADLs Moderate assistance  Vision- History  Baseline Vision/History 1 Wears glasses  Patient Visual Report Blurring of vision  Vision- Assessment  Additional Comments Reports blurring ov  vision yesterday, however improved today. Will further assess  Perception  Comments R inattention  Praxis  Praxis-Other Comments appears intact  Bed Mobility  Overal bed mobility Needs Assistance  Bed Mobility Supine to Sit;Sit to Supine  Supine to sit Min guard;HOB elevated  Sit to supine Mod assist (Lift BLE back onto bed)  Transfers  Overall transfer level Needs assistance  Transfers Sit to/from Stand;Stand Pivot Transfers  Sit to Stand Min assist  Stand pivot transfers Mod assist  Balance  Overall balance assessment Needs assistance  Sitting balance-Leahy Scale Fair  Standing balance-Leahy Scale Poor  General Comments  General comments (skin integrity, edema, etc.) VSS  OT - End of Session  Equipment Utilized During Treatment Gait belt  Activity Tolerance Patient tolerated treatment well  Patient left in bed;with call bell/phone within reach;with bed alarm set;with family/visitor present;with nursing/sitter in room  Nurse Communication Mobility status  OT Assessment  OT Recommendation/Assessment Patient needs continued OT Services  OT Visit Diagnosis Unsteadiness on feet (R26.81);Other abnormalities of gait and mobility (R26.89);Muscle weakness (generalized) (M62.81);Low vision, both eyes (H54.2);Other symptoms and signs involving cognitive function  OT Problem List Decreased strength;Decreased range of motion;Decreased activity tolerance;Impaired balance (sitting and/or standing);Impaired vision/perception;Decreased coordination;Decreased cognition;Decreased safety awareness;Decreased knowledge of use of DME or AE;Impaired sensation;Impaired UE functional use  OT Plan  OT Frequency (ACUTE ONLY) Min 2X/week  AM-PAC OT "6 Clicks" Daily Activity Outcome Measure (Version 2)  Help from another person eating meals? 3  Help from another person taking care of personal grooming? 2  Help from another person toileting, which includes using toliet, bedpan, or urinal? 2  Help from  another person bathing (including washing, rinsing, drying)? 2  Help from another person to put on and taking off regular upper body clothing? 2  Help from another person to put on and taking off regular lower body clothing? 2  6 Click Score 13  Progressive Mobility  What is the highest level of mobility based on the progressive mobility assessment? Level 3 (Stands with assist) - Balance while standing  and cannot march in place  Mobility Out of bed to chair with meals;Out of bed for toileting  OT Recommendation  Recommendations for Other Services Rehab consult  Follow Up Recommendations CIR  OT Equipment 3 in 1 bedside commode  Individuals Consulted  Consulted and Agree with Results and Recommendations Patient;Family member/caregiver  Family Member Consulted daughter  Acute Rehab OT Goals  Patient Stated Goal do more for herself and get some rehab  OT Goal Formulation With patient  Time For Goal Achievement 11/16/20  Potential to Achieve Goals Good  OT Time Calculation  OT Start Time (ACUTE ONLY) 1320  OT Stop Time (ACUTE ONLY) 1340  OT Time Calculation (min) 20 min  OT General Charges  $OT Visit 1 Visit  Written Expression  Dominant Hand Left  Maurie Boettcher, OT/L   Acute OT Clinical Specialist [place] Pager [phone] Office [phone]

## 2020-11-03 NOTE — Progress Notes (Signed)
NAME:  Frances Kent, MRN:  [id number], DOB:  09/02/1930, LOS: 2 ADMISSION DATE:  10/30/2020, CONSULTATION DATE:  9/18 REFERRING MD:  Dr. Curly Shores, REASON FOR CONSULT:  Vent management and cardiac management   History of Present Illness:  Patient is encephalopathic and/or intubated. Therefore history has been obtained from chart review.   Frances Kent is a 87 y.o. female who is seen in consultation at the request of Dr. Curly Shores for recommendations on further evaluation and management of the ventilator and cardiac management.    Frances Kent is an 87 y.o. F with a pertinent PMX of Afib/svt on xarelto, PE, HTN, hypothyroidism, anxiety, GERD.  She was admitted on 9/16 for acute on chronic CHF exacerbation, LLL pneumonia and A.fib with RVR. Cardiology was consulted. She was treated with a cardizem gtt, IV lasix, ceftriaxone, and azithromycin.  She had a brief period of hypotension on 9/17-9/18 which was believed to be iatrogenic. A code stroke was called the evening of 9/18. LKW was 1800. The patient developed global aphasia and R hemipelegia. Xarelto was given on 9/18, not a TNK candidate. CTA Head and neck showed a L ICA occlusion with reconstitution and a L M1 cutoff. NIHSS 20. She was transferred from [facility] to [facility] for ICU needs  She was taken to [facility]. A mechanical thrombectomy was preformed of the L ICA, TICI 3 with two passes.  She remains critically ill in the 4N ICU  Pertinent  Medical History  Afib/svt on xarelto, PE, HTN, hypothyroidism, anxiety, GERD.  Significant Hospital Events: Including procedures, antibiotic start and stop dates in addition to other pertinent events   9/16 Admit to [place]. Ceftriaxone. Cards consult 9/18 Code stroke, txr to [facility],  mechanical thrombectomy was preformed of the L ICA, TICI 3 with two passes. 9/19 extubated  Interim History / Subjective:  Extubated successfully.  Awake, follows commands, no complaints.  Objective   Blood pressure  106/72, pulse 98, temperature 98.5 F (36.9 C), resp. rate 20, weight 43.2 kg, SpO2 99 %.        Intake/Output Summary (Last 24 hours) at 11/03/2020 0856 Last data filed at 11/02/2020 1900 Gross per 24 hour  Intake 377.18 ml  Output --  Net 377.18 ml    Filed Weights   11/02/20 0500  Weight: 43.2 kg    Examination: General: Elderly female, resting in bed, in NAD. Neuro: A&O x 3, no deficits HEENT: [place]/AT. Sclerae anicteric. EOMI. Cardiovascular: IRIR, 2/6 SEM. Lungs: Respirations even and unlabored.  CTA bilaterally, No W/R/R.  Abdomen: BS x 4, soft, NT/ND.  Musculoskeletal: No gross deformities, no edema.  Skin: Intact, warm, no rashes.   Assessment & Plan:   Acute L ICA and L M1 occlusion - s/p NIR, mechanical thrombectomy of the L ICA, TICI 3 with two passes. -Management and work up per neurology/NIR/primary -ASA/Statin per primary  Mechanical ventilation, s/p intubation for procedure - 2/2 above, now s/p extubation 9/19. -Bronchial hygiene -Mobilize  Acute on chronic systolic CHF - echo with EF 40-45%, LA and RA mod dilated, mod - severe MR, mod TR. Permanent A.fib with RVR Prolonged QT Was on xarelto for ac. Afib on monitor, no RVR. Trop 10>9. BNP 418. MG 2.1, K 4.7. QTC 588 on 9/18 -Resume home diltiazem, xarelto. -Consider cardiology consult -Limit QTC prolonging drugs -Goal K above 4, goal MG above 2  Community aquired PNA, POA Left lower lobe, Lactate 1.2, PCT less than .10, WBC 7.8 -Continue ceftriaxone for 5 days total,  azithro d/c'd by hospitalist due to prolonged qtc -Follow fever WBC curve  HX GERD -PPI  HX PE -Resume xarelto  Anxiety -holding home xanax  Hypothyroidism -Resume home synthroid  Nutrition - passed SLP eval and cleared for thin liquid / regular diet 9/19 -Reg diet ordered  OK to transfer out of ICU per our standpoint, defer to primary. Nothing else to offer. PCCM to signoff, please call back if we can be of further  assistance.   Montey Hora, [facility] For pager details, please see AMION or use Epic chat  After 1900, please call [facility] for cross coverage needs 11/03/2020, 8:56 AM

## 2020-11-03 NOTE — Progress Notes (Signed)
[facility] ADULT ICU REPLACEMENT PROTOCOL   The patient does apply for the [facility] Adult ICU Electrolyte Replacment Protocol based on the criteria listed below:   1.Exclusion criteria: TCTS patients, ECMO patients and Hypothermia Protocol, and   Dialysis patients 2. Is GFR >/= 30 ml/min? Yes.    Patient's GFR today is 58 3. Is SCr </= 2? Yes.   Patient's SCr is 0.94 mg/dL 4. Did SCr increase >/= 0.5 in 24 hours? No. 5.Pt's weight >40kg  Yes.   6. Abnormal electrolyte(s):  K 3.6  7. Electrolytes replaced per protocol 8.  Call MD STAT for K+ </= 2.5, Phos </= 1, or Mag </= 1 Physician:  M. Dinkels  S[place]onia Small 11/03/2020 6:03 AM

## 2020-11-03 NOTE — PMR Pre-admission (Signed)
PMR Admission Coordinator Pre-Admission Assessment  Patient: Frances Kent is an 87 y.o., female MRN: [id number] DOB: 1930-08-07 Height:   Weight: 44.5 kg  Insurance Information HMO: yes    PPO:      PCP:      IPA:      80/20:      OTHER:  PRIMARY: [employer]      Policy#: [id number]      Subscriber: pt CM Name: Myriam Jacobson      Phone#: [phone] ext [phone]     Fax#: [phone] Pre-Cert#: [id number] approved for 7 days f/u with [facility] ext [phone] and same fax     Employer:  Benefits:  Phone #: [phone]     Name: 9/20 Eff. Date: 02/15/2019     Deduct: none      Out of Pocket Max: $3900      Life Max: none CIR: $295 co pay per day days 1 until 6      SNF: no co pay days 1 until 20; $178 co pay PE day days 21 until 100 Outpatient: $10 to $20 per visit     Co-Pay: visits per medical neccesity Home Health: 100%      Co-Pay: visits per medical neccesity DME: 80%     Co-Pay: 20% Providers: in network  SECONDARY: none      Policy#:      Phone#:   Development worker, community:       Phone#:   The Engineer, petroleum" for patients in Inpatient Rehabilitation Facilities with attached "Privacy Act [place] Records" was provided and verbally reviewed with: Patient and Family  Emergency Contact Information Contact Information     Name Relation Home Work [place] D Daughter [phone]  [phone]   [facility] Daughter [phone]  [phone]       Current Medical History Patient Admitting Diagnosis: CVA  History of Present Illness:  87 year old female with history of HTN, anxiety, PE/CAF- on Xarelto, SVT, MVR, esophageal stricture s/p dilatation, chronic cough who was admitted to [facility] on 10/30/20 with fatigue, worsening of SOB and palpitations. She was treated for fluid overload, started on IV antibiotics due to concerns of LLL PNA and Cardizem for rate control. On 09/18, she has near syncope with hypotension, EKG with prolonged  QTC-588-stable and later that day developed right sided weakness with aphasia, decrease in LOC with right hemianopsia. CTA showed left ICA occlusion into proximal M1 with distal reconstitution and was transferred to [facility] for cerebral angio with thrombectomy of L-ICA and L-MCA with TICI 3 by Dr. Earleen Newport. 2D echo done revealing EF 40-45% with left atrial enlargement and moderate to severe mitral valve regurgitation. Follow up MRI/MRA brain showed tiny acute infarcts in left basal ganglia and patent L-MCA.    Amiodarone on hold due to QT prolongation and Cardizem resumed PCCM recommended 5 day course of ceftriaxone for treatment of CAP and to follow temp curve. Cleared for regular diet per bedside swallow. Stroke felt to be embolic due to A fib.  Patient has been compliant with Xarelto but taking on empty stomach-->she was advised to take with dinner for better absorption.  Repeat EKG with QTC-492 and amiodarone resumed on 09/20 due to ongoing tachycardia. Her verbal output has improved with mild dysarthria and mild residual RUE weakness. Therapy ongoing and patient limited by weakness, balance deficits with posterior lean as well as SOB/tachycardia with activity.    Complete NIHSS TOTAL: 1  Patient's medical record from [facility]  and [facility] has been reviewed by the rehabilitation admission coordinator and physician.  Past Medical History  Past Medical History:  Diagnosis Date   Atrial fibrillation ([place])    Benign breast cyst in female, left 10/07/2016   Colon adenomas    GERD (gastroesophageal reflux disease)    Hypertension    Hypothyroidism    Macular degeneration    Mitral regurgitation    Pulmonary embolism ([place]) 02/2016   SVT (supraventricular tachycardia) ([facility])    Has the patient had major surgery during 100 days prior to admission? Yes  Family History   family history includes Breast cancer (age of onset: 74) in her mother; Cancer (age of onset: 8) in her son; Cancer (age of  onset: 82) in her mother; Heart disease in her son; Heart disease (age of onset: 66) in her maternal grandmother; Stroke in her sister.  Current Medications  Current Facility-Administered Medications:    acetaminophen (TYLENOL) tablet 500 mg, 500 mg, Oral, Q6H PRN, Bhagat, Srishti L, MD   amiodarone (PACERONE) tablet 200 mg, 200 mg, Oral, Daily, Rosalin Hawking, MD, 200 mg at 11/04/20 1005   diltiazem (CARDIZEM) tablet 60 mg, 60 mg, Oral, Q6H, Chand, Sudham, MD, 60 mg at 11/04/20 1005   docusate sodium (COLACE) capsule 100 mg, 100 mg, Oral, BID PRN, Bhagat, Srishti L, MD   ezetimibe (ZETIA) tablet 10 mg, 10 mg, Oral, Daily, Rosalin Hawking, MD, 10 mg at 11/04/20 1005   feeding supplement (BOOST / RESOURCE BREEZE) liquid 1 Container, 1 Container, Oral, TID BM, Garvin Fila, MD, Given at 11/04/20 1005   insulin aspart (novoLOG) injection 0-6 Units, 0-6 Units, Subcutaneous, TID WC & HS, Bhagat, Srishti L, MD   ipratropium (ATROVENT) nebulizer solution 0.5 mg, 0.5 mg, Nebulization, Q6H PRN, Bhagat, Srishti L, MD   levothyroxine (SYNTHROID) tablet 50 mcg, 50 mcg, Oral, Q0600, Bhagat, Srishti L, MD, 50 mcg at 11/04/20 0525   polyethylene glycol (MIRALAX / GLYCOLAX) packet 17 g, 17 g, Oral, Daily PRN, Bhagat, Srishti L, MD   Rivaroxaban (XARELTO) tablet 15 mg, 15 mg, Oral, Q supper, [place], Eden E, RPH, 15 mg at 11/03/20 1805  Patients Current Diet:  Diet Order             Diet regular Room service appropriate? Yes with Assist; Fluid consistency: Thin  Diet effective now                  Precautions / Restrictions Precautions Precautions: Fall Precaution Comments: R visual field deficit (also has macular degeneration) Restrictions Weight Bearing Restrictions: No   Has the patient had 2 or more falls or a fall with injury in the past year? No  Prior Activity Level Community (5-7x/wk): Independent, very active; decline in function over past few months  Prior Functional Level Self  Care: Did the patient need help bathing, dressing, using the toilet or eating? Independent  Indoor Mobility: Did the patient need assistance with walking from room to room (with or without device)? Independent  Stairs: Did the patient need assistance with internal or external stairs (with or without device)? Independent  Functional Cognition: Did the patient need help planning regular tasks such as shopping or remembering to take medications? Independent  Patient Information Are you of Hispanic, Latino/a,or Spanish origin?: A. No, not of Hispanic, Latino/a, or Spanish origin What is your race?: A. White Do you need or want an interpreter to communicate with a doctor or health care staff?: 0. No  Patient's Response To:  Health  Literacy and Transportation Is the patient able to respond to health literacy and transportation needs?: Yes Health Literacy - How often do you need to have someone help you when you read instructions, pamphlets, or other written material from your doctor or pharmacy?: Never In the past 12 months, has lack of transportation kept you from medical appointments or from getting medications?: No In the past 12 months, has lack of transportation kept you from meetings, work, or from getting things needed for daily living?: No  Home Assistive Devices / Equipment Home Equipment: Environmental consultant - 4 wheels, Shower seat, Grab bars - tub/shower  Prior Device Use: Indicate devices/aids used by the patient prior to current illness, exacerbation or injury?  Rollator in community, no AD at home  Current Functional Level Cognition  Arousal/Alertness: Awake/alert Overall Cognitive Status: Impaired/Different from baseline Current Attention Level: Selective Orientation Level: Oriented X4 Following Commands: Follows multi-step commands consistently Safety/Judgement: Decreased awareness of deficits, Decreased awareness of safety General Comments: Pt fatigued after working with OT but  agreeable to participate, pt with decreased R visual field and difficulty looking to R side without max cues. Encouraged her daughter [name]) to stand on her R side when engaging her in conversation to promote R awareness. Memory: Impaired Memory Impairment: Storage deficit, Retrieval deficit Awareness: Impaired Awareness Impairment: Anticipatory impairment, Emergent impairment Problem Solving:  (diagnostic treatment) Safety/Judgment: Impaired    Extremity Assessment (includes Sensation/Coordination)  Upper Extremity Assessment: Generalized weakness RUE Deficits / Details: AROM is WFL; BUE are generally 3+/5 with equal strength bilaterally. Sensation is WFL. Pt used RUE functionally throughout session without cueing during grooming tasks RUE Sensation: WNL RUE Coordination: WNL  Lower Extremity Assessment: Defer to PT evaluation    ADLs  Overall ADL's : Needs assistance/impaired Eating/Feeding: Minimal assistance Grooming: Min guard, Standing, Wash/dry face, Wash/dry hands, Oral care, Brushing hair Grooming Details (indicate cue type and reason): using BUE WFL for all grooming tasks at the sink; no physical assist require. Min guard for safety Upper Body Bathing: Moderate assistance Lower Body Bathing: Moderate assistance Upper Body Dressing : Moderate assistance Lower Body Dressing: Maximal assistance Toilet Transfer: Minimal assistance, Ambulation Toilet Transfer Details (indicate cue type and reason): simulated by walkign in room with sit<>stand from recliner. min A for 1 mild LOB. noted decreased step length with RLE Toileting- Clothing Manipulation and Hygiene: Maximal assistance Functional mobility during ADLs: Minimal assistance, Cueing for safety General ADL Comments: min A for functional mobility within the room without AD for 1 mild LOB. Pt attempting to reach for external support    Mobility  Overal bed mobility: Needs Assistance Bed Mobility: Supine to Sit Sidelying to  sit: Min assist Supine to sit: Supervision Sit to supine: Mod assist (Lift BLE back onto bed) General bed mobility comments: pt received/remained in recliner    Transfers  Overall transfer level: Needs assistance Equipment used: None, Rolling walker (2 wheeled) Transfers: Sit to/from Stand Sit to Stand: Min guard Stand pivot transfers: Mod assist General transfer comment: Pt trying to pull up on RW when placed in front of chair, needs cues for proper hand placement prior to standing and also prior to sitting. Pt with improved carryover of hand placement with reciprocal sit<>stand practice.    Ambulation / Gait / Stairs / Wheelchair Mobility  Ambulation/Gait Ambulation/Gait assistance: Herbalist (Feet): 50 Feet Assistive device: Rolling walker (2 wheeled) Gait Pattern/deviations: Step-through pattern, Decreased stride length, Trunk flexed, Narrow base of support, Shuffle, Decreased step length - right, Decreased  dorsiflexion - right, Decreased weight shift to right, Drifts right/left General Gait Details: minA for walker management, pt with noted decreased R awareness/visual field deficit, needs max cues to attend to R side and drifting to R throughout. max cues for improved step length/height on RLE and tending to shuffle RLE with fatigue. Gait velocity: decr Gait velocity interpretation: <1.31 ft/sec, indicative of household ambulator    Posture / Balance Balance Overall balance assessment: Needs assistance Sitting-balance support: Feet supported Sitting balance-Leahy Scale: Good Standing balance support: No upper extremity supported Standing balance-Leahy Scale: Fair Standing balance comment: static standing and dynamic standing while completing functional tasks at the sink with min Guard only for safety    Special needs/care consideration    Previous Home Environment  Living Arrangements:  (grand daughter and her 2 great grandchildren)  Lives With:  Family Available Help at Discharge: Family, Available 24 hours/day Type of Home: House Home Layout: Laundry or work area in basement, Able to live on main level with bedroom/bathroom Home Access: Level entry Bathroom Shower/Tub: Multimedia programmer: Standard Bathroom Accessibility: No [place]: No Additional Comments: Reports not using rollator, does use grab bars and shower chair for safety.  Discharge Living Setting Plans for Discharge Living Setting: Patient's home, Lives with (comment) (granddaughter) Type of Home at Discharge: House Discharge Home Layout: One level, Laundry or work area in basement Discharge Home Access: Level entry Discharge Bathroom Shower/Tub: Walk-in shower Discharge Bathroom Toilet: Standard Discharge Bathroom Accessibility: Yes How Accessible: Accessible via walker Does the patient have any problems obtaining your medications?: No  Social/Family/Support Systems Contact Information: daughters, [name] is main contact Anticipated Caregiver: [name] and her daughter, [place] Anticipated Ambulance person Information: see above Ability/Limitations of Caregiver: none Caregiver Availability: 24/7 Discharge Plan Discussed with Primary Caregiver: Yes Is Caregiver In Agreement with Plan?: Yes Does Caregiver/Family have Issues with Lodging/Transportation while Pt is in Rehab?: No  Goals Patient/Family Goal for Rehab: supervision PT, OT and SLP Expected length of stay: ELOS 7 to 10 days Pt/Family Agrees to Admission and willing to participate: Yes Program Orientation Provided & Reviewed with Pt/Caregiver Including Roles  & Responsibilities: Yes  Patient has follow up cardiology appointment  that they are aiming not to miss at [facility] clinic. She will likely only be at CIR 4 to 5 days.  Decrease burden of Care through IP rehab admission: n/a  Possible need for SNF placement upon discharge: not anticipated  Patient Condition: I have reviewed  medical records from [facility] and [facility], spoken with CM, and patient and daughter. I met with patient at the bedside for inpatient rehabilitation assessment.  Patient will benefit from ongoing PT, OT, and SLP, can actively participate in 3 hours of therapy a day 5 days of the week, and can make measurable gains during the admission.  Patient will also benefit from the coordinated team approach during an Inpatient Acute Rehabilitation admission.  The patient will receive intensive therapy as well as Rehabilitation physician, nursing, social worker, and care management interventions.  Due to bladder management, bowel management, safety, skin/wound care, disease management, medication administration, pain management, and patient education the patient requires 24 hour a day rehabilitation nursing.  The patient is currently min assist overall with mobility and basic ADLs.  Discharge setting and therapy post discharge at home with home health is anticipated.  Patient has agreed to participate in the Acute Inpatient Rehabilitation Program and will admit today.  Preadmission Screen Completed By:  Cleatrice Burke, 11/04/2020  12:00 PM ______________________________________________________________________   Discussed status with Dr. Posey Pronto  on 11/04/2020 at 1141 and received approval for admission today.  Admission Coordinator:  Cleatrice Burke, RN, time  3419 Date  11/04/2020   Assessment/Plan: Diagnosis: CVA Does the need for close, 24 hr/day Medical supervision in concert with the patient's rehab needs make it unreasonable for this patient to be served in a less intensive setting? Yes Co-Morbidities requiring supervision/potential complications: HTN, anxiety, PE/CAF- on Xarelto, SVT, MVR, esophageal stricture s/p dilatation, chronic cough  Due to safety, disease management, medication administration, and patient education, does the patient require 24 hr/day rehab nursing? Yes Does the  patient require coordinated care of a physician, rehab nurse, PT, OT, and SLP to address physical and functional deficits in the context of the above medical diagnosis(es)? Yes Addressing deficits in the following areas: balance, endurance, locomotion, strength, bathing, dressing, cognition, and psychosocial support Can the patient actively participate in an intensive therapy program of at least 3 hrs of therapy 5 days a week? Yes The potential for patient to make measurable gains while on inpatient rehab is excellent and good Anticipated functional outcomes upon discharge from inpatient rehab: supervision PT, supervision OT, supervision SLP Estimated rehab length of stay to reach the above functional goals is: 7-10 days. Anticipated discharge destination: Home 10. Overall Rehab/Functional Prognosis: good   MD Signature: Delice Lesch, MD, ABPMR

## 2020-11-03 NOTE — Progress Notes (Signed)
Patient called RN to room to report "Left eye blurry vision that has worsened." Upon exam RN finds no changes in [facility]. MD paged and aware. Will continue to monitor for changes.

## 2020-11-04 ENCOUNTER — Encounter (HOSPITAL_COMMUNITY): Payer: Self-pay | Admitting: Physical Medicine and Rehabilitation

## 2020-11-04 ENCOUNTER — Inpatient Hospital Stay (HOSPITAL_COMMUNITY)
Admission: RE | Admit: 2020-11-04 | Discharge: 2020-11-15 | DRG: 057 | Disposition: A | Discharge status: Home Health | Payer: Medicare HMO | Source: Intra-hospital | Attending: Physical Medicine and Rehabilitation | Admitting: Physical Medicine and Rehabilitation

## 2020-11-04 DIAGNOSIS — R0602 Shortness of breath: Secondary | ICD-10-CM

## 2020-11-04 DIAGNOSIS — N179 Acute kidney failure, unspecified: Secondary | ICD-10-CM | POA: Diagnosis not present

## 2020-11-04 DIAGNOSIS — Z803 Family history of malignant neoplasm of breast: Secondary | ICD-10-CM

## 2020-11-04 DIAGNOSIS — I639 Cerebral infarction, unspecified: Secondary | ICD-10-CM | POA: Diagnosis present

## 2020-11-04 DIAGNOSIS — Z8249 Family history of ischemic heart disease and other diseases of the circulatory system: Secondary | ICD-10-CM | POA: Diagnosis not present

## 2020-11-04 DIAGNOSIS — I452 Bifascicular block: Secondary | ICD-10-CM | POA: Diagnosis not present

## 2020-11-04 DIAGNOSIS — Z86711 Personal history of pulmonary embolism: Secondary | ICD-10-CM | POA: Diagnosis not present

## 2020-11-04 DIAGNOSIS — I130 Hypertensive heart and chronic kidney disease with heart failure and stage 1 through stage 4 chronic kidney disease, or unspecified chronic kidney disease: Secondary | ICD-10-CM | POA: Diagnosis not present

## 2020-11-04 DIAGNOSIS — I69354 Hemiplegia and hemiparesis following cerebral infarction affecting left non-dominant side: Principal | ICD-10-CM

## 2020-11-04 DIAGNOSIS — I63232 Cerebral infarction due to unspecified occlusion or stenosis of left carotid arteries: Secondary | ICD-10-CM

## 2020-11-04 DIAGNOSIS — F419 Anxiety disorder, unspecified: Secondary | ICD-10-CM | POA: Diagnosis present

## 2020-11-04 DIAGNOSIS — E876 Hypokalemia: Secondary | ICD-10-CM | POA: Diagnosis not present

## 2020-11-04 DIAGNOSIS — F5104 Psychophysiologic insomnia: Secondary | ICD-10-CM | POA: Diagnosis present

## 2020-11-04 DIAGNOSIS — R1310 Dysphagia, unspecified: Secondary | ICD-10-CM | POA: Diagnosis present

## 2020-11-04 DIAGNOSIS — I517 Cardiomegaly: Secondary | ICD-10-CM | POA: Diagnosis not present

## 2020-11-04 DIAGNOSIS — Z8616 Personal history of COVID-19: Secondary | ICD-10-CM | POA: Diagnosis not present

## 2020-11-04 DIAGNOSIS — I69322 Dysarthria following cerebral infarction: Secondary | ICD-10-CM | POA: Diagnosis not present

## 2020-11-04 DIAGNOSIS — I69391 Dysphagia following cerebral infarction: Secondary | ICD-10-CM

## 2020-11-04 DIAGNOSIS — I4891 Unspecified atrial fibrillation: Secondary | ICD-10-CM | POA: Diagnosis not present

## 2020-11-04 DIAGNOSIS — R9431 Abnormal electrocardiogram [ECG] [EKG]: Secondary | ICD-10-CM | POA: Diagnosis not present

## 2020-11-04 DIAGNOSIS — Z9049 Acquired absence of other specified parts of digestive tract: Secondary | ICD-10-CM

## 2020-11-04 DIAGNOSIS — I69319 Unspecified symptoms and signs involving cognitive functions following cerebral infarction: Secondary | ICD-10-CM | POA: Diagnosis not present

## 2020-11-04 DIAGNOSIS — I951 Orthostatic hypotension: Secondary | ICD-10-CM | POA: Diagnosis not present

## 2020-11-04 DIAGNOSIS — K219 Gastro-esophageal reflux disease without esophagitis: Secondary | ICD-10-CM | POA: Diagnosis present

## 2020-11-04 DIAGNOSIS — Z800 Family history of malignant neoplasm of digestive organs: Secondary | ICD-10-CM

## 2020-11-04 DIAGNOSIS — I4819 Other persistent atrial fibrillation: Secondary | ICD-10-CM | POA: Diagnosis not present

## 2020-11-04 DIAGNOSIS — I340 Nonrheumatic mitral (valve) insufficiency: Secondary | ICD-10-CM | POA: Diagnosis present

## 2020-11-04 DIAGNOSIS — I10 Essential (primary) hypertension: Secondary | ICD-10-CM

## 2020-11-04 DIAGNOSIS — Z79899 Other long term (current) drug therapy: Secondary | ICD-10-CM

## 2020-11-04 DIAGNOSIS — E039 Hypothyroidism, unspecified: Secondary | ICD-10-CM | POA: Diagnosis present

## 2020-11-04 DIAGNOSIS — I5022 Chronic systolic (congestive) heart failure: Secondary | ICD-10-CM

## 2020-11-04 DIAGNOSIS — E785 Hyperlipidemia, unspecified: Secondary | ICD-10-CM | POA: Diagnosis not present

## 2020-11-04 DIAGNOSIS — N182 Chronic kidney disease, stage 2 (mild): Secondary | ICD-10-CM | POA: Diagnosis present

## 2020-11-04 DIAGNOSIS — Z7901 Long term (current) use of anticoagulants: Secondary | ICD-10-CM

## 2020-11-04 DIAGNOSIS — Z86718 Personal history of other venous thrombosis and embolism: Secondary | ICD-10-CM

## 2020-11-04 DIAGNOSIS — I69320 Aphasia following cerebral infarction: Secondary | ICD-10-CM | POA: Diagnosis not present

## 2020-11-04 DIAGNOSIS — Z515 Encounter for palliative care: Secondary | ICD-10-CM | POA: Diagnosis not present

## 2020-11-04 DIAGNOSIS — J90 Pleural effusion, not elsewhere classified: Secondary | ICD-10-CM | POA: Diagnosis not present

## 2020-11-04 DIAGNOSIS — I679 Cerebrovascular disease, unspecified: Secondary | ICD-10-CM | POA: Diagnosis present

## 2020-11-04 DIAGNOSIS — Z7189 Other specified counseling: Secondary | ICD-10-CM | POA: Diagnosis not present

## 2020-11-04 DIAGNOSIS — Z823 Family history of stroke: Secondary | ICD-10-CM

## 2020-11-04 DIAGNOSIS — Z79890 Hormone replacement therapy (postmenopausal): Secondary | ICD-10-CM

## 2020-11-04 DIAGNOSIS — Z888 Allergy status to other drugs, medicaments and biological substances status: Secondary | ICD-10-CM

## 2020-11-04 LAB — BASIC METABOLIC PANEL
Anion gap: 6 (ref 5–15)
BUN: 9 mg/dL (ref 8–23)
CO2: 27 mmol/L (ref 22–32)
Calcium: 8.4 mg/dL — ABNORMAL LOW (ref 8.9–10.3)
Chloride: 107 mmol/L (ref 98–111)
Creatinine, Ser: 0.93 mg/dL (ref 0.44–1.00)
GFR, Estimated: 59 mL/min — ABNORMAL LOW (ref >60)
Glucose, Bld: 99 mg/dL (ref 70–99)
Potassium: 4.4 mmol/L (ref 3.5–5.1)
Sodium: 140 mmol/L (ref 135–145)

## 2020-11-04 LAB — CULTURE, BLOOD (ROUTINE X 2)
Culture: NO GROWTH
Culture: NO GROWTH
Special Requests: ADEQUATE
Special Requests: ADEQUATE

## 2020-11-04 LAB — GLUCOSE, CAPILLARY
Glucose-Capillary: 100 mg/dL — ABNORMAL HIGH (ref 70–99)
Glucose-Capillary: 105 mg/dL — ABNORMAL HIGH (ref 70–99)
Glucose-Capillary: 177 mg/dL — ABNORMAL HIGH (ref 70–99)

## 2020-11-04 LAB — CBC
HCT: 29.4 % — ABNORMAL LOW (ref 36.0–46.0)
Hemoglobin: 9 g/dL — ABNORMAL LOW (ref 12.0–15.0)
MCH: 27.5 pg (ref 26.0–34.0)
MCHC: 30.6 g/dL (ref 30.0–36.0)
MCV: 89.9 fL (ref 80.0–100.0)
Platelets: 331 10*3/uL (ref 150–400)
RBC: 3.27 MIL/uL — ABNORMAL LOW (ref 3.87–5.11)
RDW: 17.7 % — ABNORMAL HIGH (ref 11.5–15.5)
WBC: 8 10*3/uL (ref 4.0–10.5)
nRBC: 0 % (ref 0.0–0.2)

## 2020-11-04 MED ORDER — PROCHLORPERAZINE 25 MG RE SUPP: 12.5000 mg | Freq: Four times a day (QID) | RECTAL | Status: DC | PRN

## 2020-11-04 MED ORDER — PANTOPRAZOLE SODIUM 20 MG PO TBEC
20.0000 mg | DELAYED_RELEASE_TABLET | Freq: Every day | ORAL | Status: DC | PRN
  Filled 2020-11-04: qty 1

## 2020-11-04 MED ORDER — EZETIMIBE 10 MG PO TABS: 10.0000 mg | ORAL_TABLET | Freq: Every day | ORAL | 0 refills | Status: DC

## 2020-11-04 MED ORDER — PROCHLORPERAZINE EDISYLATE 10 MG/2ML IJ SOLN: 5.0000 mg | Freq: Four times a day (QID) | INTRAMUSCULAR | Status: DC | PRN

## 2020-11-04 MED ORDER — EZETIMIBE 10 MG PO TABS
10.0000 mg | ORAL_TABLET | Freq: Every day | ORAL | Status: DC
  Administered 2020-11-06 – 2020-11-14 (×9): 10 mg via ORAL
  Filled 2020-11-04 (×11): qty 1

## 2020-11-04 MED ORDER — FLEET ENEMA 7-19 GM/118ML RE ENEM: 1.0000 | ENEMA | Freq: Once | RECTAL | Status: DC | PRN

## 2020-11-04 MED ORDER — POLYETHYLENE GLYCOL 3350 17 G PO PACK: 17.0000 g | PACK | Freq: Every day | ORAL | Status: DC | PRN

## 2020-11-04 MED ORDER — PROCHLORPERAZINE MALEATE 5 MG PO TABS: 5.0000 mg | ORAL_TABLET | Freq: Four times a day (QID) | ORAL | Status: DC | PRN

## 2020-11-04 MED ORDER — LEVOTHYROXINE SODIUM 50 MCG PO TABS: 50.0000 ug | ORAL_TABLET | Freq: Every day | ORAL | 0 refills | Status: DC

## 2020-11-04 MED ORDER — POLYETHYLENE GLYCOL 3350 17 G PO PACK: 17.0000 g | PACK | Freq: Every day | ORAL | 0 refills | Status: DC | PRN

## 2020-11-04 MED ORDER — DIPHENHYDRAMINE HCL 12.5 MG/5ML PO ELIX: 12.5000 mg | ORAL_SOLUTION | Freq: Four times a day (QID) | ORAL | Status: DC | PRN

## 2020-11-04 MED ORDER — CALCIUM CARBONATE ANTACID 500 MG PO CHEW
1.0000 | CHEWABLE_TABLET | Freq: Two times a day (BID) | ORAL | Status: DC
  Administered 2020-11-04: 200 mg via ORAL
  Filled 2020-11-04 (×9): qty 1

## 2020-11-04 MED ORDER — INSULIN ASPART 100 UNIT/ML IJ SOLN: 0.0000 [IU] | Freq: Three times a day (TID) | INTRAMUSCULAR | 11 refills | Status: DC

## 2020-11-04 MED ORDER — DILTIAZEM HCL 60 MG PO TABS
60.0000 mg | ORAL_TABLET | Freq: Four times a day (QID) | ORAL | Status: DC
  Administered 2020-11-04 – 2020-11-05 (×5): 60 mg via ORAL
  Filled 2020-11-04 (×7): qty 1

## 2020-11-04 MED ORDER — RIVAROXABAN 15 MG PO TABS
15.0000 mg | ORAL_TABLET | Freq: Every day | ORAL | Status: DC
  Administered 2020-11-04 – 2020-11-14 (×11): 15 mg via ORAL
  Filled 2020-11-04 (×11): qty 1

## 2020-11-04 MED ORDER — DILTIAZEM HCL 60 MG PO TABS: 60.0000 mg | ORAL_TABLET | Freq: Four times a day (QID) | ORAL | 0 refills | Status: DC

## 2020-11-04 MED ORDER — GUAIFENESIN-DM 100-10 MG/5ML PO SYRP
5.0000 mL | ORAL_SOLUTION | Freq: Four times a day (QID) | ORAL | Status: DC | PRN
  Administered 2020-11-05: 10 mL via ORAL
  Filled 2020-11-04 (×3): qty 10

## 2020-11-04 MED ORDER — DOCUSATE SODIUM 100 MG PO CAPS: 100.0000 mg | ORAL_CAPSULE | Freq: Two times a day (BID) | ORAL | 0 refills | Status: DC | PRN

## 2020-11-04 MED ORDER — MELATONIN 3 MG PO TABS
3.0000 mg | ORAL_TABLET | Freq: Every evening | ORAL | Status: DC | PRN
  Administered 2020-11-05 – 2020-11-07 (×3): 3 mg via ORAL
  Filled 2020-11-04 (×4): qty 1

## 2020-11-04 MED ORDER — RIVAROXABAN 15 MG PO TABS: 15.0000 mg | ORAL_TABLET | Freq: Every day | ORAL | 0 refills | Status: DC

## 2020-11-04 MED ORDER — IPRATROPIUM BROMIDE 0.02 % IN SOLN: 0.5000 mg | Freq: Four times a day (QID) | RESPIRATORY_TRACT | 12 refills | Status: DC | PRN

## 2020-11-04 MED ORDER — FAMOTIDINE 20 MG PO TABS
20.0000 mg | ORAL_TABLET | Freq: Two times a day (BID) | ORAL | Status: DC
  Administered 2020-11-05 – 2020-11-08 (×4): 20 mg via ORAL
  Filled 2020-11-04 (×9): qty 1

## 2020-11-04 MED ORDER — ALUM & MAG HYDROXIDE-SIMETH 200-200-20 MG/5ML PO SUSP: 30.0000 mL | ORAL | Status: DC | PRN

## 2020-11-04 MED ORDER — LEVOTHYROXINE SODIUM 25 MCG PO TABS
50.0000 ug | ORAL_TABLET | Freq: Every day | ORAL | Status: DC
  Administered 2020-11-05 – 2020-11-15 (×11): 50 ug via ORAL
  Filled 2020-11-04 (×11): qty 2

## 2020-11-04 MED ORDER — IPRATROPIUM BROMIDE 0.02 % IN SOLN: 0.5000 mg | Freq: Four times a day (QID) | RESPIRATORY_TRACT | Status: DC | PRN

## 2020-11-04 MED ORDER — ACETAMINOPHEN 325 MG PO TABS
325.0000 mg | ORAL_TABLET | ORAL | Status: DC | PRN
  Filled 2020-11-04: qty 2

## 2020-11-04 MED ORDER — AMIODARONE HCL 200 MG PO TABS
200.0000 mg | ORAL_TABLET | Freq: Every day | ORAL | Status: DC
  Administered 2020-11-05: 200 mg via ORAL
  Filled 2020-11-04: qty 1

## 2020-11-04 MED ORDER — BOOST / RESOURCE BREEZE PO LIQD CUSTOM
1.0000 | Freq: Three times a day (TID) | ORAL | Status: DC
  Administered 2020-11-04 – 2020-11-05 (×4): 1 via ORAL

## 2020-11-04 MED ORDER — BISACODYL 10 MG RE SUPP: 10.0000 mg | Freq: Every day | RECTAL | Status: DC | PRN

## 2020-11-04 NOTE — Progress Notes (Addendum)
Inpatient Rehabilitation Medication Review by a Pharmacist  A complete drug regimen review was completed for this patient to identify any potential clinically significant medication issues.  High Risk Drug Classes Is patient taking? Indication by Medication  Antipsychotic Yes Compazine prn for nausea  Anticoagulant Yes Xarelto 15 mg daily with supper for atrial fibrillation  Antibiotic No Cetriaxone 2 gm IV q24h x 5 days completed 9/20//22.  Opioid No   Antiplatelet No   Hypoglycemics/insulin No   Vasoactive Medication Yes Amiodarone 200 mg daily, Diltiazem 60 mg q6h  Chemotherapy No   Other Yes Pepcid 20 mg BID, Synthroid 50 mcg daily, Zetia 10 mg daily, Calcium (Tums) 200 mg BID     Type of Medication Issue Identified Description of Issue Recommendation(s)  Significant med changes from prior encounter (inform family/care partners about these prior to discharge). New Amiodarone, Diltiazem, Calcium (low Calcium), Zetia (intolerant to statins, LDL 81 on 11/02/20).   Other  DC summary includes Sliding Scale Insulin, Docusate and Miralax prn. Hemoglobin A1C 5.8% on 11/02/20. Has not needed SSI except for 1 unit given today for CBG 177. CBGs otherwise 91-125. Monitor glucose. Bisacodyl suppository and Fleets enema ordered prn for moderate and severe constipation.  Additional medications may be ordered if needed.    Clinically significant medication issues were identified that warrant physician communication and completion of prescribed/recommended actions by midnight of the next day:  No  Name of provider notified for non-urgent issues identified:   Reesa Chew, PA-C. Regarding Ceftriaxone course.  Provider Method of Notification: secure chat  Pharmacist comments:   Ceftriaxone course reviewed:  3 doses were given at [facility] 9/16-9/18, then 2 doses at [facility] 9/19 and 9/20.  - Also received Azithromycin at [facility] 9/16-9/18; stopped early due to prolonged QTc.    Time spent performing this drug  regimen review (minutes):  [address]   Arty Baumgartner, [place] 11/04/2020 3:51 PM

## 2020-11-04 NOTE — Progress Notes (Signed)
Inpatient Rehabilitation Admissions Coordinator   I have insurance approval and CIR bed to admit pt to today. I met with patient and her daughter, [name] at bedside. They are in agreement. I contacted Dr Erlinda Hong, acute team and TOC . I will make the arrangements to admit today.  Danne Baxter, RN, MSN Rehab Admissions Coordinator [phone] 11/04/2020 11:10 AM

## 2020-11-04 NOTE — Plan of Care (Signed)
?  Problem: Nutrition: ?Goal: Dietary intake will improve ?Outcome: Progressing ?  ?

## 2020-11-04 NOTE — Progress Notes (Signed)
Patient arrived on unit from [facility] around 1530. Patient appears alert and denies pain.

## 2020-11-04 NOTE — Discharge Summary (Addendum)
Stroke Discharge Summary  Patient ID: Frances Kent   MRN: [id number]      DOB: [birth year]  Date of Admission: 11/01/2020 Date of Discharge: 11/04/2020  Attending Physician:  Stroke, Md, MD, Stroke MD Consultant(s):    cardiology CCM Patient's PCP:  [name], MD  DISCHARGE DIAGNOSIS:  Active Problems:   Hyperlipidemia LDL goal <70   Atrial fibrillation with RVR ([place])   Acute ischemic left ICA stroke ([place])   Acute cerebrovascular accident (CVA) due to occlusion of left carotid artery ([place])   Endotracheally intubated   Prolonged Q-T interval on ECG   Acute on chronic systolic (congestive) heart failure ([place])   Protein-calorie malnutrition, severe   Allergies as of 11/04/2020       Reactions   Statins    Severe myalgias        Medication List     STOP taking these medications    ALPRAZolam 0.25 MG tablet Commonly known as: XANAX   benzonatate 200 MG capsule Commonly known as: TESSALON   cefTRIAXone 2 g in sodium chloride 0.9 % 100 mL   chlorpheniramine-HYDROcodone 10-8 MG/5ML Suer Commonly known as: TUSSIONEX   esomeprazole 40 MG capsule Commonly known as: NexIUM   potassium chloride SA 20 MEQ tablet Commonly known as: KLOR-CON       TAKE these medications    acetaminophen 500 MG tablet Commonly known as: TYLENOL Take 500 mg by mouth every 6 (six) hours as needed.   amiodarone 200 MG tablet Commonly known as: PACERONE Take 200 mg by mouth daily.   diltiazem 60 MG tablet Commonly known as: CARDIZEM Take 1 tablet (60 mg total) by mouth every 6 (six) hours. What changed:  medication strength how much to take when to take this   docusate sodium 100 MG capsule Commonly known as: COLACE Take 1 capsule (100 mg total) by mouth 2 (two) times daily as needed for mild constipation.   ezetimibe 10 MG tablet Commonly known as: ZETIA Take 1 tablet (10 mg total) by mouth daily. Start taking on: November 05, 2020   insulin aspart 100  UNIT/ML injection Commonly known as: novoLOG Inject 0-6 Units into the skin 4 (four) times daily -  with meals and at bedtime.   ipratropium 0.02 % nebulizer solution Commonly known as: ATROVENT Take 2.5 mLs (0.5 mg total) by nebulization every 6 (six) hours as needed for wheezing or shortness of breath (cough). What changed: when to take this   levothyroxine 50 MCG tablet Commonly known as: Euthyrox Take 1 tablet (50 mcg total) by mouth daily at 6 (six) AM. Start taking on: November 05, 2020 What changed:  how much to take how to take this when to take this additional instructions   polyethylene glycol 17 g packet Commonly known as: MIRALAX / GLYCOLAX Take 17 g by mouth daily as needed for moderate constipation.   Rivaroxaban 15 MG Tabs tablet Commonly known as: XARELTO Take 1 tablet (15 mg total) by mouth daily with supper. What changed:  how much to take when to take this        LABORATORY STUDIES CBC    Component Value Date/Time   WBC 8.0 11/04/2020 0325   RBC 3.27 (L) 11/04/2020 0325   HGB 9.0 (L) 11/04/2020 0325   HGB 14.3 03/22/2014 1229   HCT 29.4 (L) 11/04/2020 0325   HCT 44.0 03/22/2014 1229   PLT 331 11/04/2020 0325   PLT 237 03/22/2014 1229   MCV  89.9 11/04/2020 0325   MCV 91 03/22/2014 1229   MCH 27.5 11/04/2020 0325   MCHC 30.6 11/04/2020 0325   RDW 17.7 (H) 11/04/2020 0325   RDW 14.8 (H) 03/22/2014 1229   LYMPHSABS 1.2 07/12/2020 1126   LYMPHSABS 2.0 02/06/2013 1122   MONOABS 0.7 07/12/2020 1126   MONOABS 0.5 02/06/2013 1122   EOSABS 0.0 07/12/2020 1126   EOSABS 0.1 02/06/2013 1122   BASOSABS 0.1 07/12/2020 1126   BASOSABS 0.1 02/06/2013 1122   CMP    Component Value Date/Time   NA 140 11/04/2020 0325   NA 139 03/22/2014 1229   K 4.4 11/04/2020 0325   K 4.2 03/22/2014 1229   CL 107 11/04/2020 0325   CL 107 03/22/2014 1229   CO2 27 11/04/2020 0325   CO2 25 03/22/2014 1229   GLUCOSE 99 11/04/2020 0325   GLUCOSE 90 03/22/2014 1229    BUN 9 11/04/2020 0325   BUN 16 03/22/2014 1229   CREATININE 0.93 11/04/2020 0325   CREATININE 0.90 03/22/2014 1229   CALCIUM 8.4 (L) 11/04/2020 0325   CALCIUM 8.3 (L) 03/22/2014 1229   PROT 6.8 10/30/2020 0939   PROT 7.3 02/05/2013 1425   ALBUMIN 2.6 (L) 11/01/2020 0532   ALBUMIN 3.9 02/05/2013 1425   AST 24 10/30/2020 0939   AST 26 02/05/2013 1425   ALT 15 10/30/2020 0939   ALT 22 02/05/2013 1425   ALKPHOS 89 10/30/2020 0939   ALKPHOS 92 02/05/2013 1425   BILITOT 1.8 (H) 10/30/2020 0939   BILITOT 0.5 02/05/2013 1425   GFRNONAA 59 (L) 11/04/2020 0325   GFRNONAA >60 03/22/2014 1229   GFRNONAA >60 02/06/2013 1122   GFRAA 51 (L) 08/17/2018 0544   GFRAA >60 03/22/2014 1229   GFRAA >60 02/06/2013 1122   COAGS Lab Results  Component Value Date   INR 3.0 (H) 10/15/2020   INR 1.03 03/04/2016   INR 0.94 12/02/2014   Lipid Panel    Component Value Date/Time   CHOL 144 11/02/2020 0509   TRIG 76 11/02/2020 0509   TRIG 72 11/02/2020 0509   HDL 48 11/02/2020 0509   CHOLHDL 3.0 11/02/2020 0509   VLDL 15 11/02/2020 0509   LDLCALC 81 11/02/2020 0509   HgbA1C  Lab Results  Component Value Date   HGBA1C 5.8 (H) 11/02/2020   Urinalysis    Component Value Date/Time   COLORURINE YELLOW (A) 09/08/2020 0105   APPEARANCEUR HAZY (A) 09/08/2020 0105   APPEARANCEUR Clear 02/05/2013 1630   LABSPEC 1.024 09/08/2020 0105   LABSPEC 1.009 02/05/2013 1630   PHURINE 6.0 09/08/2020 0105   GLUCOSEU NEGATIVE 09/08/2020 0105   GLUCOSEU NEGATIVE 04/04/2018 1149   HGBUR NEGATIVE 09/08/2020 0105   BILIRUBINUR NEGATIVE 09/08/2020 0105   BILIRUBINUR 1 08/31/2017 1157   BILIRUBINUR Negative 02/05/2013 1630   KETONESUR 5 (A) 09/08/2020 0105   PROTEINUR 100 (A) 09/08/2020 0105   UROBILINOGEN 1.0 04/04/2018 1149   NITRITE NEGATIVE 09/08/2020 0105   LEUKOCYTESUR NEGATIVE 09/08/2020 0105   LEUKOCYTESUR 1+ 02/05/2013 1630   Urine Drug Screen No results found for: LABOPIA, COCAINSCRNUR, LABBENZ,  AMPHETMU, THCU, LABBARB  Alcohol Level No results found for: ETH   SIGNIFICANT DIAGNOSTIC STUDIES DG Chest 2 View  Result Date: 10/31/2020 CLINICAL DATA:  Shortness of breath, weakness EXAM: CHEST - 2 VIEW COMPARISON:  10/30/2020 FINDINGS: No significant change in chest radiographs with gross cardiomegaly and a small left pleural effusion with associated atelectasis or consolidation. No new airspace opacity. Disc degenerative disease of the thoracic  spine. IMPRESSION: No significant change in chest radiographs with gross cardiomegaly and a small left pleural effusion with associated atelectasis or consolidation. No new airspace opacity. Electronically Signed   By: Eddie Candle M.D.   On: 10/31/2020 11:30   DG Chest 2 View  Result Date: 10/15/2020 CLINICAL DATA:  Atrial fibrillation EXAM: CHEST - 2 VIEW COMPARISON:  09/08/2020, chest CT 10/05/2020 FINDINGS: Mild interstitial prominence. Hazy density bilaterally could reflect the ground-glass opacities seen on prior CT. There is no pleural effusion or pneumothorax. Left atrial enlargement. No acute osseous abnormality. IMPRESSION: Mild interstitial prominence. Hazy density bilaterally could reflect ground-glass opacity seen on prior CT. May be infectious/inflammatory. Left atrial enlargement. Electronically Signed   By: Macy Mis M.D.   On: 10/15/2020 15:20   MR ANGIO HEAD WO CONTRAST  Result Date: 11/02/2020 CLINICAL DATA:  Stroke follow-up EXAM: MRI HEAD WITHOUT CONTRAST MRA HEAD WITHOUT CONTRAST TECHNIQUE: Multiplanar, multi-echo pulse sequences of the brain and surrounding structures were acquired without intravenous contrast. Angiographic images of the Circle of Willis were acquired using MRA technique without intravenous contrast. COMPARISON:  CTA from yesterday FINDINGS: MRI HEAD FINDINGS Brain: Tiny acute infarcts at the left basal ganglia. No acute hemorrhage, hydrocephalus, collection, or masslike finding age normal brain volume Vascular:  See below Skull and upper cervical spine: Normal marrow signal Sinuses/Orbits: Partial right mastoid opacification with negative nasopharynx. Bilateral cataract resection. MRA HEAD FINDINGS Anterior circulation: Patent carotid, M1, A1 segments. No evidence of branch occlusion Posterior circulation: Vertebral, basilar, and proximal posterior cerebral arteries are patent without evidence of branch occlusion. Anatomic variants: None significant Extensive motion artifact which could easily obscure pathology. IMPRESSION: 1. Tiny acute infarcts at the left basal ganglia. 2. The left MCA remains patent. 3. Significant motion artifact. Electronically Signed   By: Jorje Guild M.D.   On: 11/02/2020 06:49   MR BRAIN WO CONTRAST  Result Date: 11/02/2020 CLINICAL DATA:  Stroke follow-up EXAM: MRI HEAD WITHOUT CONTRAST MRA HEAD WITHOUT CONTRAST TECHNIQUE: Multiplanar, multi-echo pulse sequences of the brain and surrounding structures were acquired without intravenous contrast. Angiographic images of the Circle of Willis were acquired using MRA technique without intravenous contrast. COMPARISON:  CTA from yesterday FINDINGS: MRI HEAD FINDINGS Brain: Tiny acute infarcts at the left basal ganglia. No acute hemorrhage, hydrocephalus, collection, or masslike finding age normal brain volume Vascular: See below Skull and upper cervical spine: Normal marrow signal Sinuses/Orbits: Partial right mastoid opacification with negative nasopharynx. Bilateral cataract resection. MRA HEAD FINDINGS Anterior circulation: Patent carotid, M1, A1 segments. No evidence of branch occlusion Posterior circulation: Vertebral, basilar, and proximal posterior cerebral arteries are patent without evidence of branch occlusion. Anatomic variants: None significant Extensive motion artifact which could easily obscure pathology. IMPRESSION: 1. Tiny acute infarcts at the left basal ganglia. 2. The left MCA remains patent. 3. Significant motion artifact.  Electronically Signed   By: Jorje Guild M.D.   On: 11/02/2020 06:49   IR CT Head Ltd  Result Date: 11/02/2020 INDICATION: 87 year old female with acute left-sided ICA terminus occlusion, presents for angiogram and mechanical thrombectomy EXAM: ULTRASOUND-GUIDED ACCESS RIGHT COMMON FEMORAL ARTERY CERVICAL AND CEREBRAL ANGIOGRAM MECHANICAL THROMBECTOMY LEFT ICA/MCA SPIN CT ANGIO-SEAL FOR HEMOSTASIS COMPARISON:  CT imaging same day MEDICATIONS: None ANESTHESIA/SEDATION: The anesthesia team was present to provide general endotracheal tube anesthesia and for patient monitoring during the procedure. Intubation was performed in room 2, neuro biplane room. Left radial arterial line was performed by the anesthesia team. Interventional neuro radiology nursing staff was also  present. CONTRAST:  64 cc FLUOROSCOPY TIME:  Fluoroscopy Time: 15 minutes 12 seconds (793.6 mGy). COMPLICATIONS: None TECHNIQUE: Informed written consent was obtained from the patient's family after a thorough discussion of the procedural risks, benefits and alternatives. Specific risks discussed include: Bleeding, infection, contrast reaction, kidney injury/failure, need for further procedure/surgery, arterial injury or dissection, embolization to new territory, intracranial hemorrhage (10-15% risk), neurologic deterioration, cardiopulmonary collapse, death. All questions were addressed. Maximal Sterile Barrier Technique was utilized including during the procedure including caps, mask, sterile gowns, sterile gloves, sterile drape, hand hygiene and skin antiseptic. A timeout was performed prior to the initiation of the procedure. The anesthesia team was present to provide general endotracheal tube anesthesia and for patient monitoring during the procedure. Interventional neuro radiology nursing staff was also present. FINDINGS: Initial Findings: Aortic arch is 3 vessel arch, type 3. Left common carotid artery:  Normal course caliber and contour.  Left external carotid artery: Patent with antegrade flow. Left internal carotid artery: Significant accordion appearance of the cervical ICA in the proximal and mid segment, less prominent at the skull base, compatible with fibromuscular dysplasia. There is slow flow through the distal cervical segment, including the horizontal and vertical petrous segment, the lateral segment, cavernous segment and supra ophthalmic segment. There is a meniscus occlusion before the terminus with no flow into either the ACA or the MCA. Left MCA:  No flow into the MCA on the initial. Left ACA:  No flow into the ACA on the initial. Completion Findings: Left MCA: Complete restoration of flow through the MCA after the second pass. No significant posterior cerebral artery, with patent anterior choroidal artery. The left temporal lobe perfused by the PCA, with the preoperative CT demonstrating filling through the temporal lobe. TICI 3: Complete perfusion of the territory Flat panel CT: No hemorrhage identified. PROCEDURE: The anesthesia team was present to provide general endotracheal tube anesthesia and for patient monitoring during the procedure. Intubation was performed in negative pressure [place] in neuro IR holding. Interventional neuro radiology nursing staff was also present. Ultrasound survey of the right inguinal region was performed with images stored and sent to PACs. 11 blade scalpel was used to make a small incision. Blunt dissection was performed with US guidance. A micropuncture needle was used access the right common femoral artery under ultrasound. With excellent arterial blood flow returned, an .018 micro wire was passed through the needle, observed to enter the abdominal aorta under fluoroscopy. The needle was removed, and a micropuncture sheath was placed over the wire. The inner dilator and wire were removed, and an 035 wire was advanced under fluoroscopy into the abdominal aorta. The sheath was removed and a 25cm 22F  straight vascular sheath was placed. The dilator was removed and the sheath was flushed. Sheath was attached to pressurized and heparinized saline bag for constant forward flow. A David catheter was then advanced over the 035 wire to the proximal descending thoracic aorta. The wire was then removed and the David catheter was double flushed. David catheter was then used to select the origin of the left common carotid artery. A standard Glidewire was then advanced into the cervical ICA. David catheter was then advanced into the distal segment of the cervical ICA. Glidewire was removed, catheter was double flush, and angiogram confirmed location. A rose in wire was then passed into the distal cervical segment and the David catheter was removed. Coaxial system was then advanced over the [employer] wire. This included a 95cm 087 "[place]" balloon guide  with coaxial 100 cm Davis diagnostic catheter. Once the coaxial system was into the distal cervical segment, the David catheter and the [facility] an wire were removed from the balloon guide with adequate flush at the hub of the balloon guide. Double flush was performed. Formal angiogram was performed. Road map function was used once the occluded vessel was identified. Copious back flush was performed and the balloon catheter was attached to heparinized and pressurized saline bag for forward flow. A second coaxial system was then advanced through the balloon catheter, which included the selected intermediate catheter, microcatheter, and microwire. In this scenario, the set up included a zoom 71 aspiration catheter, a Trevo Provue18 microcatheter, and 014 synchro soft wire. This system was advanced through the balloon guide catheter under the road-map function, with adequate back-flush at the rotating hemostatic valve at that back end of the balloon guide. Microcatheter and the intermediate catheter system were advanced through the carotid siphon to the level of the occlusion. Some  manipulation was required around the ophthalmic artery origin, as withdrawal of the zoom catheter was performed with manual rotation to attempt a more suitable configuration at the ophthalmic artery origin. Once the zoom catheter was positioned proximal to the occlusion and beyond the ophthalmic artery, the microwire and microcatheter were removed. Rotating hemostatic valve was removed. The proprietary engine was then attached to the hub of the zoom catheter, with aspiration initiated as the first pass. With blood return confirmed the catheter was gently advanced into the site of the occlusion and passed into the proximal M1 segment. We observed approximately 60 seconds of aspiration time. Before withdrawing the aspiration catheter into the balloon guide, the balloon on the tip of the balloon guide was gently inflated under fluoroscopy. We then withdrew the aspiration catheter into the balloon guide confirmed free flow of blood return from the zoom catheter, we withdrew the aspiration catheter and then confirmed free flow of blood at the hub of the balloon guide catheter. Balloon was deflated and then angiogram was performed. This restored flow through the terminus segment into the A1 segment. There was migration of embolus into the proximal M1 segment, with no flow through the proximal M1. We elected a second pass. The same coaxial system was then advanced through the balloon catheter, which included the zoom 71 aspiration catheter, a Trevo Provue18 microcatheter, and 014 synchro soft wire. This system was advanced through the balloon guide catheter under the road-map function, with adequate back-flush at the rotating hemostatic valve at that back end of the balloon guide. The microwire and microcatheter were navigated with a looped microwire configuration through the terminus segment into the MCA. The microwire was passed beyond the site of the occlusion, with the microcatheter gently pushed through over the  microwire into the distal M1 segment to the insular region. Zoom catheter was then positioned in a location at the terminus directed towards the MCA. Microwire was gently removed from the microcatheter with adequate flush at the hub. Blood was then aspirated through the hub of the microcatheter, and a gentle contrast injection was performed confirming intraluminal position. A rotating hemostatic valve was then attached to the back end of the microcatheter, and a pressurized and heparinized saline bag was attached to the catheter. 4 x 40 solitaire device was then selected. Back flush was achieved at the rotating hemostatic valve, and then the device was gently advanced through the microcatheter to the distal end. The retriever was then unsheathed by withdrawing the microcatheter under fluoroscopy. Once  the retriever was completely unsheathed, the microcatheter was carefully stripped from the delivery device. Control angiogram was performed from the intermediate catheter. A 3 minute time interval was observed. The balloon at the balloon guide catheter was then inflated under fluoroscopy for proximal flow arrest. Constant aspiration using the proprietary engine was then performed at the zoom 71 catheter, which was slowly advanced until stasis of blood flow was observed. The retriever was then gently and slowly withdrawn with fluoroscopic observation. Once the retriever was "corked" within the tip of the intermediate catheter, both were removed from the system. Free aspiration was confirmed at the hub of the balloon guide catheter, with free blood return confirmed. The balloon was then deflated, and a control angiogram was performed. Restoration of flow was confirmed. Angiogram of the cervical ICA was performed. Balloon guide was then removed. The skin at the puncture site was then cleaned with Chlorhexidine. The 8 French sheath was removed and an 29F angioseal was deployed. Flat panel CT was performed. Patient tolerated  the procedure well and remained hemodynamically stable throughout. No complications were encountered and no significant blood loss encountered. IMPRESSION: Status post ultrasound guided access right common femoral artery for left-sided cervical/cerebral angiogram and treatment of terminus/MCA occlusion with 2 passes of aspiration thrombectomy/mechanical thrombectomy, achieving TICI 3 flow. Angio-Seal for hemostasis. Angiogram reveals significant fibromuscular dysplasia (FMD) of the left cervical ICA. Signed, Dulcy Fanny. Dellia Nims, [facility] Radiology PLAN: The patient will remain intubated. ICU status Target systolic blood pressure of 120-140 Right hip straight time 4 hours Frequent neurovascular checks Repeat neurologic imaging with CT and/MRI at the discretion of neurology team Electronically Signed   By: Corrie Mckusick D.O.   On: 11/02/2020 08:32   IR US Guide Vasc Access Right  Result Date: 11/02/2020 INDICATION: 87 year old female with acute left-sided ICA terminus occlusion, presents for angiogram and mechanical thrombectomy EXAM: ULTRASOUND-GUIDED ACCESS RIGHT COMMON FEMORAL ARTERY CERVICAL AND CEREBRAL ANGIOGRAM MECHANICAL THROMBECTOMY LEFT ICA/MCA SPIN CT ANGIO-SEAL FOR HEMOSTASIS COMPARISON:  CT imaging same day MEDICATIONS: None ANESTHESIA/SEDATION: The anesthesia team was present to provide general endotracheal tube anesthesia and for patient monitoring during the procedure. Intubation was performed in room 2, neuro biplane room. Left radial arterial line was performed by the anesthesia team. Interventional neuro radiology nursing staff was also present. CONTRAST:  64 cc FLUOROSCOPY TIME:  Fluoroscopy Time: 15 minutes 12 seconds (793.6 mGy). COMPLICATIONS: None TECHNIQUE: Informed written consent was obtained from the patient's family after a thorough discussion of the procedural risks, benefits and alternatives. Specific risks discussed include:  Bleeding, infection, contrast reaction, kidney injury/failure, need for further procedure/surgery, arterial injury or dissection, embolization to new territory, intracranial hemorrhage (10-15% risk), neurologic deterioration, cardiopulmonary collapse, death. All questions were addressed. Maximal Sterile Barrier Technique was utilized including during the procedure including caps, mask, sterile gowns, sterile gloves, sterile drape, hand hygiene and skin antiseptic. A timeout was performed prior to the initiation of the procedure. The anesthesia team was present to provide general endotracheal tube anesthesia and for patient monitoring during the procedure. Interventional neuro radiology nursing staff was also present. FINDINGS: Initial Findings: Aortic arch is 3 vessel arch, type 3. Left common carotid artery:  Normal course caliber and contour. Left external carotid artery: Patent with antegrade flow. Left internal carotid artery: Significant accordion appearance of the cervical ICA in the proximal and mid segment, less prominent at the skull base, compatible with fibromuscular dysplasia. There is slow flow through the distal cervical segment,  including the horizontal and vertical petrous segment, the lateral segment, cavernous segment and supra ophthalmic segment. There is a meniscus occlusion before the terminus with no flow into either the ACA or the MCA. Left MCA:  No flow into the MCA on the initial. Left ACA:  No flow into the ACA on the initial. Completion Findings: Left MCA: Complete restoration of flow through the MCA after the second pass. No significant posterior cerebral artery, with patent anterior choroidal artery. The left temporal lobe perfused by the PCA, with the preoperative CT demonstrating filling through the temporal lobe. TICI 3: Complete perfusion of the territory Flat panel CT: No hemorrhage identified. PROCEDURE: The anesthesia team was present to provide general endotracheal tube  anesthesia and for patient monitoring during the procedure. Intubation was performed in negative pressure [place] in neuro IR holding. Interventional neuro radiology nursing staff was also present. Ultrasound survey of the right inguinal region was performed with images stored and sent to PACs. 11 blade scalpel was used to make a small incision. Blunt dissection was performed with US guidance. A micropuncture needle was used access the right common femoral artery under ultrasound. With excellent arterial blood flow returned, an .018 micro wire was passed through the needle, observed to enter the abdominal aorta under fluoroscopy. The needle was removed, and a micropuncture sheath was placed over the wire. The inner dilator and wire were removed, and an 035 wire was advanced under fluoroscopy into the abdominal aorta. The sheath was removed and a 25cm 83F straight vascular sheath was placed. The dilator was removed and the sheath was flushed. Sheath was attached to pressurized and heparinized saline bag for constant forward flow. A David catheter was then advanced over the 035 wire to the proximal descending thoracic aorta. The wire was then removed and the David catheter was double flushed. David catheter was then used to select the origin of the left common carotid artery. A standard Glidewire was then advanced into the cervical ICA. David catheter was then advanced into the distal segment of the cervical ICA. Glidewire was removed, catheter was double flush, and angiogram confirmed location. A rose in wire was then passed into the distal cervical segment and the David catheter was removed. Coaxial system was then advanced over the [employer] wire. This included a 95cm 087 "Walrus" balloon guide with coaxial 100 cm Davis diagnostic catheter. Once the coaxial system was into the distal cervical segment, the David catheter and the [facility] an wire were removed from the balloon guide with adequate flush at the hub of the balloon  guide. Double flush was performed. Formal angiogram was performed. Road map function was used once the occluded vessel was identified. Copious back flush was performed and the balloon catheter was attached to heparinized and pressurized saline bag for forward flow. A second coaxial system was then advanced through the balloon catheter, which included the selected intermediate catheter, microcatheter, and microwire. In this scenario, the set up included a zoom 71 aspiration catheter, a Trevo Provue18 microcatheter, and 014 synchro soft wire. This system was advanced through the balloon guide catheter under the road-map function, with adequate back-flush at the rotating hemostatic valve at that back end of the balloon guide. Microcatheter and the intermediate catheter system were advanced through the carotid siphon to the level of the occlusion. Some manipulation was required around the ophthalmic artery origin, as withdrawal of the zoom catheter was performed with manual rotation to attempt a more suitable configuration at the ophthalmic artery origin. Once  the zoom catheter was positioned proximal to the occlusion and beyond the ophthalmic artery, the microwire and microcatheter were removed. Rotating hemostatic valve was removed. The proprietary engine was then attached to the hub of the zoom catheter, with aspiration initiated as the first pass. With blood return confirmed the catheter was gently advanced into the site of the occlusion and passed into the proximal M1 segment. We observed approximately 60 seconds of aspiration time. Before withdrawing the aspiration catheter into the balloon guide, the balloon on the tip of the balloon guide was gently inflated under fluoroscopy. We then withdrew the aspiration catheter into the balloon guide confirmed free flow of blood return from the zoom catheter, we withdrew the aspiration catheter and then confirmed free flow of blood at the hub of the balloon guide catheter.  Balloon was deflated and then angiogram was performed. This restored flow through the terminus segment into the A1 segment. There was migration of embolus into the proximal M1 segment, with no flow through the proximal M1. We elected a second pass. The same coaxial system was then advanced through the balloon catheter, which included the zoom 71 aspiration catheter, a Trevo Provue18 microcatheter, and 014 synchro soft wire. This system was advanced through the balloon guide catheter under the road-map function, with adequate back-flush at the rotating hemostatic valve at that back end of the balloon guide. The microwire and microcatheter were navigated with a looped microwire configuration through the terminus segment into the MCA. The microwire was passed beyond the site of the occlusion, with the microcatheter gently pushed through over the microwire into the distal M1 segment to the insular region. Zoom catheter was then positioned in a location at the terminus directed towards the MCA. Microwire was gently removed from the microcatheter with adequate flush at the hub. Blood was then aspirated through the hub of the microcatheter, and a gentle contrast injection was performed confirming intraluminal position. A rotating hemostatic valve was then attached to the back end of the microcatheter, and a pressurized and heparinized saline bag was attached to the catheter. 4 x 40 solitaire device was then selected. Back flush was achieved at the rotating hemostatic valve, and then the device was gently advanced through the microcatheter to the distal end. The retriever was then unsheathed by withdrawing the microcatheter under fluoroscopy. Once the retriever was completely unsheathed, the microcatheter was carefully stripped from the delivery device. Control angiogram was performed from the intermediate catheter. A 3 minute time interval was observed. The balloon at the balloon guide catheter was then inflated under  fluoroscopy for proximal flow arrest. Constant aspiration using the proprietary engine was then performed at the zoom 71 catheter, which was slowly advanced until stasis of blood flow was observed. The retriever was then gently and slowly withdrawn with fluoroscopic observation. Once the retriever was "corked" within the tip of the intermediate catheter, both were removed from the system. Free aspiration was confirmed at the hub of the balloon guide catheter, with free blood return confirmed. The balloon was then deflated, and a control angiogram was performed. Restoration of flow was confirmed. Angiogram of the cervical ICA was performed. Balloon guide was then removed. The skin at the puncture site was then cleaned with Chlorhexidine. The 8 French sheath was removed and an 59F angioseal was deployed. Flat panel CT was performed. Patient tolerated the procedure well and remained hemodynamically stable throughout. No complications were encountered and no significant blood loss encountered. IMPRESSION: Status post ultrasound guided access right common  femoral artery for left-sided cervical/cerebral angiogram and treatment of terminus/MCA occlusion with 2 passes of aspiration thrombectomy/mechanical thrombectomy, achieving TICI 3 flow. Angio-Seal for hemostasis. Angiogram reveals significant fibromuscular dysplasia (FMD) of the left cervical ICA. Signed, Dulcy Fanny. Dellia Nims, [place] [facility] Radiology PLAN: The patient will remain intubated. ICU status Target systolic blood pressure of 120-140 Right hip straight time 4 hours Frequent neurovascular checks Repeat neurologic imaging with CT and/MRI at the discretion of neurology team Electronically Signed   By: Corrie Mckusick D.O.   On: 11/02/2020 08:32   DG Chest Port 1 View  Result Date: 11/01/2020 CLINICAL DATA:  Endotracheal tube EXAM: PORTABLE CHEST 1 VIEW COMPARISON:  10/31/2020 FINDINGS: Small left pleural  effusion and basilar atelectasis. Normal cardiomediastinal contours. Unchanged chronic interstitial prominence. IMPRESSION: Small left pleural effusion and basilar atelectasis, unchanged. Electronically Signed   By: Ulyses Jarred M.D.   On: 11/01/2020 22:12   DG Chest Port 1 View  Result Date: 10/30/2020 CLINICAL DATA:  Shortness of breath and weakness. EXAM: PORTABLE CHEST 1 VIEW COMPARISON:  10/15/2020 FINDINGS: Numerous leads and wires project over the chest. Patient rotated minimally left. Midline trachea. Mild cardiomegaly. Small left pleural effusion. No pneumothorax. Interstitial prominence and indistinctness is increased. Left greater than right base airspace disease. IMPRESSION: Cardiomegaly with mild congestive heart failure. Small left pleural effusion with bibasilar airspace disease. This could represent atelectasis or, especially at the left lung base, infection. Electronically Signed   By: Abigail Miyamoto M.D.   On: 10/30/2020 10:12   ECHOCARDIOGRAM COMPLETE  Result Date: 11/02/2020    ECHOCARDIOGRAM REPORT   Patient Name:   Frances Kent Date of Exam: 11/01/2020 Medical Rec #:  [id number]        Height:       63.0 in Accession #:    [id number]       Weight:       95.2 lb Date of Birth:  [birth year]        BSA:          1.411 m Patient Age:    87 years         BP:           97/61 mmHg Patient Gender: F                HR:           95 bpm. Exam Location:  [facility] Procedure: 2D Echo, Cardiac Doppler and Color Doppler Indications:     Dyspnea R06.00  History:         Patient has prior history of Echocardiogram examinations. Risk                  Factors:Hypertension. MR, SVT.  Sonographer:     Alyse Low Roar Referring Phys:  [place] Diagnosing Phys: Isaias Cowman MD IMPRESSIONS  1. Left ventricular ejection fraction, by estimation, is 40 to 45%. The left ventricle has mildly decreased function. The left ventricle has no regional wall motion abnormalities. Left ventricular diastolic  parameters are indeterminate.  2. Right ventricular systolic function is normal. The right ventricular size is normal.  3. Left atrial size was moderately dilated.  4. Right atrial size was moderately dilated.  5. The mitral valve is normal in structure. Moderate to severe mitral valve regurgitation. No evidence of mitral stenosis.  6. Tricuspid valve regurgitation is moderate.  7. The aortic valve is normal in structure. Aortic valve regurgitation is trivial.  No aortic stenosis is present.  8. The inferior vena cava is normal in size with greater than 50% respiratory variability, suggesting right atrial pressure of 3 mmHg. FINDINGS  Left Ventricle: Left ventricular ejection fraction, by estimation, is 40 to 45%. The left ventricle has mildly decreased function. The left ventricle has no regional wall motion abnormalities. The left ventricular internal cavity size was normal in size. There is no left ventricular hypertrophy. Left ventricular diastolic parameters are indeterminate. Right Ventricle: The right ventricular size is normal. No increase in right ventricular wall thickness. Right ventricular systolic function is normal. Left Atrium: Left atrial size was moderately dilated. Right Atrium: Right atrial size was moderately dilated. Pericardium: There is no evidence of pericardial effusion. Mitral Valve: The mitral valve is normal in structure. Moderate to severe mitral valve regurgitation. No evidence of mitral valve stenosis. MV peak gradient, 17.1 mmHg. The mean mitral valve gradient is 6.0 mmHg. Tricuspid Valve: The tricuspid valve is normal in structure. Tricuspid valve regurgitation is moderate . No evidence of tricuspid stenosis. Aortic Valve: The aortic valve is normal in structure. Aortic valve regurgitation is trivial. No aortic stenosis is present. Aortic valve peak gradient measures 2.9 mmHg. Pulmonic Valve: The pulmonic valve was normal in structure. Pulmonic valve regurgitation is not visualized.  No evidence of pulmonic stenosis. Aorta: The aortic root is normal in size and structure. Venous: The inferior vena cava is normal in size with greater than 50% respiratory variability, suggesting right atrial pressure of 3 mmHg. IAS/Shunts: No atrial level shunt detected by color flow Doppler.  LEFT VENTRICLE PLAX 2D LVIDd:         3.70 cm  Diastology LVIDs:         3.00 cm  LV e' medial:    3.48 cm/s LV PW:         1.30 cm  LV E/e' medial:  61.8 LV IVS:        1.20 cm  LV e' lateral:   4.24 cm/s LVOT diam:     2.00 cm  LV E/e' lateral: 50.7 LV SV:         30 LV SV Index:   21 LVOT Area:     3.14 cm  RIGHT VENTRICLE RV Basal diam:  3.60 cm RV Mid diam:    2.70 cm RV S prime:     10.20 cm/s TAPSE (M-mode): 1.2 cm LEFT ATRIUM              Index       RIGHT ATRIUM           Index LA diam:        4.55 cm  3.23 cm/m  RA Area:     20.70 cm LA Vol (A2C):   105.0 ml 74.43 ml/m RA Volume:   57.60 ml  40.83 ml/m LA Vol (A4C):   67.0 ml  47.49 ml/m LA Biplane Vol: 86.5 ml  61.32 ml/m  AORTIC VALVE                PULMONIC VALVE AV Area (Vmax): 2.12 cm    PV Vmax:          0.58 m/s AV Vmax:        84.70 cm/s  PV Peak grad:     1.4 mmHg AV Peak Grad:   2.9 mmHg    PR End Diast Vel: 6.35 msec LVOT Vmax:      57.05 cm/s  RVOT Peak grad:   1 mmHg  LVOT Vmean:     41.600 cm/s LVOT VTI:       0.096 m  AORTA Ao Root diam: 2.90 cm MITRAL VALVE                TRICUSPID VALVE MV Area (PHT): 4.49 cm     TR Peak grad:   56.6 mmHg MV Area VTI:   0.62 cm     TR Vmax:        376.00 cm/s MV Peak grad:  17.1 mmHg MV Mean grad:  6.0 mmHg     SHUNTS MV Vmax:       2.07 m/s     Systemic VTI:  0.10 m MV Vmean:      112.0 cm/s   Systemic Diam: 2.00 cm MV Decel Time: 169 msec MV E velocity: 215.00 cm/s MV A Prime:    3.3 cm/s Isaias Cowman MD Electronically signed by Isaias Cowman MD Signature Date/Time: 11/02/2020/9:18:05 AM    Final    IR PERCUTANEOUS ART THROMBECTOMY/INFUSION INTRACRANIAL INC DIAG ANGIO  Result Date:  11/02/2020 INDICATION: 87 year old female with acute left-sided ICA terminus occlusion, presents for angiogram and mechanical thrombectomy EXAM: ULTRASOUND-GUIDED ACCESS RIGHT COMMON FEMORAL ARTERY CERVICAL AND CEREBRAL ANGIOGRAM MECHANICAL THROMBECTOMY LEFT ICA/MCA SPIN CT ANGIO-SEAL FOR HEMOSTASIS COMPARISON:  CT imaging same day MEDICATIONS: None ANESTHESIA/SEDATION: The anesthesia team was present to provide general endotracheal tube anesthesia and for patient monitoring during the procedure. Intubation was performed in room 2, neuro biplane room. Left radial arterial line was performed by the anesthesia team. Interventional neuro radiology nursing staff was also present. CONTRAST:  64 cc FLUOROSCOPY TIME:  Fluoroscopy Time: 15 minutes 12 seconds (793.6 mGy). COMPLICATIONS: None TECHNIQUE: Informed written consent was obtained from the patient's family after a thorough discussion of the procedural risks, benefits and alternatives. Specific risks discussed include: Bleeding, infection, contrast reaction, kidney injury/failure, need for further procedure/surgery, arterial injury or dissection, embolization to new territory, intracranial hemorrhage (10-15% risk), neurologic deterioration, cardiopulmonary collapse, death. All questions were addressed. Maximal Sterile Barrier Technique was utilized including during the procedure including caps, mask, sterile gowns, sterile gloves, sterile drape, hand hygiene and skin antiseptic. A timeout was performed prior to the initiation of the procedure. The anesthesia team was present to provide general endotracheal tube anesthesia and for patient monitoring during the procedure. Interventional neuro radiology nursing staff was also present. FINDINGS: Initial Findings: Aortic arch is 3 vessel arch, type 3. Left common carotid artery:  Normal course caliber and contour. Left external carotid artery: Patent with antegrade flow. Left internal carotid artery: Significant accordion  appearance of the cervical ICA in the proximal and mid segment, less prominent at the skull base, compatible with fibromuscular dysplasia. There is slow flow through the distal cervical segment, including the horizontal and vertical petrous segment, the lateral segment, cavernous segment and supra ophthalmic segment. There is a meniscus occlusion before the terminus with no flow into either the ACA or the MCA. Left MCA:  No flow into the MCA on the initial. Left ACA:  No flow into the ACA on the initial. Completion Findings: Left MCA: Complete restoration of flow through the MCA after the second pass. No significant posterior cerebral artery, with patent anterior choroidal artery. The left temporal lobe perfused by the PCA, with the preoperative CT demonstrating filling through the temporal lobe. TICI 3: Complete perfusion of the territory Flat panel CT: No hemorrhage identified. PROCEDURE: The anesthesia team was present to provide general endotracheal tube anesthesia and for patient  monitoring during the procedure. Intubation was performed in negative pressure [place] in neuro IR holding. Interventional neuro radiology nursing staff was also present. Ultrasound survey of the right inguinal region was performed with images stored and sent to PACs. 11 blade scalpel was used to make a small incision. Blunt dissection was performed with US guidance. A micropuncture needle was used access the right common femoral artery under ultrasound. With excellent arterial blood flow returned, an .018 micro wire was passed through the needle, observed to enter the abdominal aorta under fluoroscopy. The needle was removed, and a micropuncture sheath was placed over the wire. The inner dilator and wire were removed, and an 035 wire was advanced under fluoroscopy into the abdominal aorta. The sheath was removed and a 25cm 43F straight vascular sheath was placed. The dilator was removed and the sheath was flushed. Sheath was attached to  pressurized and heparinized saline bag for constant forward flow. A David catheter was then advanced over the 035 wire to the proximal descending thoracic aorta. The wire was then removed and the David catheter was double flushed. David catheter was then used to select the origin of the left common carotid artery. A standard Glidewire was then advanced into the cervical ICA. David catheter was then advanced into the distal segment of the cervical ICA. Glidewire was removed, catheter was double flush, and angiogram confirmed location. A rose in wire was then passed into the distal cervical segment and the David catheter was removed. Coaxial system was then advanced over the [employer] wire. This included a 95cm 087 "Walrus" balloon guide with coaxial 100 cm Davis diagnostic catheter. Once the coaxial system was into the distal cervical segment, the David catheter and the [facility] an wire were removed from the balloon guide with adequate flush at the hub of the balloon guide. Double flush was performed. Formal angiogram was performed. Road map function was used once the occluded vessel was identified. Copious back flush was performed and the balloon catheter was attached to heparinized and pressurized saline bag for forward flow. A second coaxial system was then advanced through the balloon catheter, which included the selected intermediate catheter, microcatheter, and microwire. In this scenario, the set up included a zoom 71 aspiration catheter, a Trevo Provue18 microcatheter, and 014 synchro soft wire. This system was advanced through the balloon guide catheter under the road-map function, with adequate back-flush at the rotating hemostatic valve at that back end of the balloon guide. Microcatheter and the intermediate catheter system were advanced through the carotid siphon to the level of the occlusion. Some manipulation was required around the ophthalmic artery origin, as withdrawal of the zoom catheter was performed  with manual rotation to attempt a more suitable configuration at the ophthalmic artery origin. Once the zoom catheter was positioned proximal to the occlusion and beyond the ophthalmic artery, the microwire and microcatheter were removed. Rotating hemostatic valve was removed. The proprietary engine was then attached to the hub of the zoom catheter, with aspiration initiated as the first pass. With blood return confirmed the catheter was gently advanced into the site of the occlusion and passed into the proximal M1 segment. We observed approximately 60 seconds of aspiration time. Before withdrawing the aspiration catheter into the balloon guide, the balloon on the tip of the balloon guide was gently inflated under fluoroscopy. We then withdrew the aspiration catheter into the balloon guide confirmed free flow of blood return from the zoom catheter, we withdrew the aspiration catheter and then confirmed free  flow of blood at the hub of the balloon guide catheter. Balloon was deflated and then angiogram was performed. This restored flow through the terminus segment into the A1 segment. There was migration of embolus into the proximal M1 segment, with no flow through the proximal M1. We elected a second pass. The same coaxial system was then advanced through the balloon catheter, which included the zoom 71 aspiration catheter, a Trevo Provue18 microcatheter, and 014 synchro soft wire. This system was advanced through the balloon guide catheter under the road-map function, with adequate back-flush at the rotating hemostatic valve at that back end of the balloon guide. The microwire and microcatheter were navigated with a looped microwire configuration through the terminus segment into the MCA. The microwire was passed beyond the site of the occlusion, with the microcatheter gently pushed through over the microwire into the distal M1 segment to the insular region. Zoom catheter was then positioned in a location at the  terminus directed towards the MCA. Microwire was gently removed from the microcatheter with adequate flush at the hub. Blood was then aspirated through the hub of the microcatheter, and a gentle contrast injection was performed confirming intraluminal position. A rotating hemostatic valve was then attached to the back end of the microcatheter, and a pressurized and heparinized saline bag was attached to the catheter. 4 x 40 solitaire device was then selected. Back flush was achieved at the rotating hemostatic valve, and then the device was gently advanced through the microcatheter to the distal end. The retriever was then unsheathed by withdrawing the microcatheter under fluoroscopy. Once the retriever was completely unsheathed, the microcatheter was carefully stripped from the delivery device. Control angiogram was performed from the intermediate catheter. A 3 minute time interval was observed. The balloon at the balloon guide catheter was then inflated under fluoroscopy for proximal flow arrest. Constant aspiration using the proprietary engine was then performed at the zoom 71 catheter, which was slowly advanced until stasis of blood flow was observed. The retriever was then gently and slowly withdrawn with fluoroscopic observation. Once the retriever was "corked" within the tip of the intermediate catheter, both were removed from the system. Free aspiration was confirmed at the hub of the balloon guide catheter, with free blood return confirmed. The balloon was then deflated, and a control angiogram was performed. Restoration of flow was confirmed. Angiogram of the cervical ICA was performed. Balloon guide was then removed. The skin at the puncture site was then cleaned with Chlorhexidine. The 8 French sheath was removed and an 26F angioseal was deployed. Flat panel CT was performed. Patient tolerated the procedure well and remained hemodynamically stable throughout. No complications were encountered and no  significant blood loss encountered. IMPRESSION: Status post ultrasound guided access right common femoral artery for left-sided cervical/cerebral angiogram and treatment of terminus/MCA occlusion with 2 passes of aspiration thrombectomy/mechanical thrombectomy, achieving TICI 3 flow. Angio-Seal for hemostasis. Angiogram reveals significant fibromuscular dysplasia (FMD) of the left cervical ICA. Signed, Dulcy Fanny. Dellia Nims, [place] [facility] Radiology PLAN: The patient will remain intubated. ICU status Target systolic blood pressure of 120-140 Right hip straight time 4 hours Frequent neurovascular checks Repeat neurologic imaging with CT and/MRI at the discretion of neurology team Electronically Signed   By: Corrie Mckusick D.O.   On: 11/02/2020 08:32   CT ANGIO HEAD NECK W WO CM (CODE STROKE)  Result Date: 11/01/2020 CLINICAL DATA:  Neuro deficit, acute, stroke suspected. Clinical concern for left MCA infarct.  EXAM: CT ANGIOGRAPHY HEAD AND NECK TECHNIQUE: Multidetector CT imaging of the head and neck was performed using the standard protocol during bolus administration of intravenous contrast. Multiplanar CT image reconstructions and MIPs were obtained to evaluate the vascular anatomy. Carotid stenosis measurements (when applicable) are obtained utilizing NASCET criteria, using the distal internal carotid diameter as the denominator. CONTRAST:  34mL OMNIPAQUE IOHEXOL 350 MG/ML SOLN COMPARISON:  Head CT 03/09/2020. High-resolution chest CT 10/05/2020. FINDINGS: CT HEAD FINDINGS Brain: There is no evidence of an acute infarct, intracranial hemorrhage, mass, midline shift, or extra-axial fluid collection. Mild cerebral atrophy is within normal limits for age. Vascular: Calcified atherosclerosis at the skull base. Hyperdense left ICA terminus/proximal left MCA. Skull: No fracture or suspicious osseous lesion. Sinuses: Clear paranasal sinuses. Persistent moderate right  mastoid effusion. Orbits: Bilateral cataract extraction. Review of the MIP images confirms the above findings CTA NECK FINDINGS Aortic arch: Standard 3 vessel aortic arch with mild atherosclerotic plaque. Widely patent arch vessel origins. Right carotid system: Patent with minimal calcified plaque at the carotid bifurcation. No evidence of a significant stenosis or dissection. Beading of the mid to distal cervical ICA. Left carotid system: The common carotid artery and bifurcation are widely patent and there is a normal appearance of the proximal most ICA, however there is progressively diminishing enhancement of the mid and distal portions of the cervical ICA with only faint enhancement at the skull base. A discrete dissection flap is not visible. Vertebral arteries: Patent and codominant without evidence of stenosis or dissection. Skeleton: Focally advanced cervical disc degeneration at C5-6 where there is grade 1 anterolisthesis. Other neck: No evidence of cervical lymphadenopathy or mass. Upper chest: Small bilateral pleural effusions. Bilateral ground-glass opacities with interlobular septal thickening, increased from the prior chest CT. Borderline enlarged mediastinal lymph nodes, potentially reactive. Review of the MIP images confirms the above findings CTA HEAD FINDINGS Anterior circulation: There is progressively diminishing faint opacification of the petrous and cavernous segments of the left ICA with absent opacification from the level of the anterior genu through the terminus. The left M1 segment is occluded proximally with reconstitution at the level of the MCA bifurcation although reconstituted MCA branches are mildly attenuated diffusely. The left A1 segment reconstitutes just beyond its origin. The right MCA and right ACA are patent without evidence of a significant proximal stenosis. No aneurysm is identified. Posterior circulation: Intracranial vertebral arteries are patent to the basilar. Patent  PICA, AICA, and SCA origins are visualized bilaterally. The basilar artery is widely patent. Posterior communicating arteries are diminutive or absent. The PCAs are patent with mild multifocal P2 stenoses more notable on the left than the right. No aneurysm is identified. Venous sinuses: Patent. Anatomic variants: None. Review of the MIP images confirms the above findings IMPRESSION: 1. Occlusion of the intracranial left ICA including the ICA terminus and left MCA and ACA origins. Reconstitution of the left MCA and ACA as above. 2. No visible acute infarct or intracranial hemorrhage. ASPECTS of 10. 3. Small pleural effusions and partially visualized bilateral lung opacities, nonspecific but could reflect edema or infection. 4.  Aortic Atherosclerosis (ICD10-I70.0). These results were called by telephone at the time of interpretation on 11/01/2020 at 6:40 pm to Dr. Su Monks, who verbally acknowledged these results. Electronically Signed   By: Logan Bores M.D.   On: 11/01/2020 19:01      HISTORY OF PRESENT ILLNESS Patient with hx HTN and a fib on xarelto admitted for a fib w/ RVR, acute on  chronic CHF exacerbation and possible LLL PNA. Inpatient stroke code was called for acute onset global aphasia and R hemiplegia. LKW 1800. CTH NAICP, ASPECTS 10. Not a TNK candidate 2/2 on xarelto. CTA showed L ICA occlusion with reconstitution and L M1 cutoff. IR was emergently consulted. She was taken to IR by Dr. Earleen Newport for thrombectomy.   HOSPITAL COURSE Stroke:  Small left punctate anterior CR stroke with L ICA and MCA occlusion s/p IR with TICI 3, likely due to afib RVR on Xarelto (but taking empty stomach). CT head No visible acute infarct or intracranial hemorrhage. ASPECTS of 10. Code Stroke CTA head & neck Occlusion of the intracranial left ICA including the ICA terminusand left MCA and ACA origins. Reconstitution of the left MCA and ACA.  MRI brain Tiny acute infarcts at the left basal ganglia. MRA head the  left MCA remains patent. Significant motion artifact. 2D Echo EF 40-45% Left atrial size was moderately dilated. Right atrial size was  LDL 81 HgbA1c 5.8 VTE prophylaxis - on Xarelto ON Xarelto 15mg  daily, now on Xarelto.  Recommend patient to continue Xarelto along with dinner. Therapy recommendations:  CIR Disposition: Pending   A fib with RVR on admission On Xarelto prior to admission On cardizem and amiodarone at home  Restarted Cardizem Repeat EKG, QTC 492 Amiodarone will be restarted today Restarted Xarelto, recommend taking Xarelto along with dinner   Hypertension Home meds: Cardizem Stable On home Cardizem BP goal < 180/105 Long-term BP goal normotensive         LLL CAP Diagnosed prior to this admission Continue ceftriaxone for 5 days total azithro d/c'd by hospitalist due to prolonged qtc Monitoring fever and WBC curve        CHF upon admission  BNP 1073 in ED, Diuresed in ED, on lasix prior to stroke episode Monitor I/O Stable so far   Hyperlipidemia Home meds: None LDL 81, goal < 70  History of statin allergy with severe myalgias Put on Zetia 10 Continue Zetia at discharge   Other Stroke Risk Factors Advanced Age >/= 70   Hx of PE  Family hx stroke (Sister)   Other Active Problems Hypothyroidism Anxiety (home regimen is Xanax prn for anxiety or sleep)       DISCHARGE EXAM Blood pressure 117/70, pulse 77, temperature 98.7 F (37.1 C), temperature source Oral, resp. rate 16, weight 44.5 kg, SpO2 97 %. General - Well nourished, well developed, in no apparent distress.    Ophthalmologic - fundi not visualized due to noncooperation.   Cardiovascular - Regular rhythm and rate.   On exam, patient awake alert, eyes open, orientated to place, self and time.  Mild dysarthria due to poor denture, no aphasia, able to repeat and name, hypophonia, follows simple commands.  No gaze palsy, visual field fall.  Mild right facial droop.  Right upper extremity  mild pronator drift, finger grip 4/5.  Right lower extremity proximal and knee flexion 4/5, left upper and lower extremity at least 4+/5.  Finger-to-nose bilaterally intact however left slow in action.  Sensation symmetrical  Discharge Diet       Diet   Diet regular Room service appropriate? Yes with Assist; Fluid consistency: Thin   liquids  DISCHARGE PLAN Disposition:  CIR Xarelto (rivaroxaban) daily for secondary stroke prevention Ongoing stroke risk factor control by Primary Care Physician at time of discharge Follow-up PCP [name], MD in 2 weeks after discharge from [place] in [facility] in  4 weeks, office to schedule an appointment.   33 minutes were spent preparing discharge.  Laurey Morale, MSN, NP-C [facility] [phone]  ATTENDING NOTE: I reviewed above note and agree with the assessment and plan.    No acute event overnight, neuro stable, continue Xarelto with dinner.  Continue Zetia.  PT/OT recommend CIR, will discharge to CIR today.  Follow-up with [facility] in 4 weeks.  For detailed assessment and plan, please refer to above as I have made changes wherever appropriate.   Frances Hawking, MD PhD Stroke Neurology 11/04/2020 8:40 PM

## 2020-11-04 NOTE — Consult Note (Signed)
   [facility] CM Inpatient Consult   11/04/2020  KYRRA PRADA 17-Feb-1930 [id number]  [facility] [ACO] Patient: Frances Kent HMO  Primary Care Provider:  [name], MD, [facility] is an Embedded provider with a chronic care management [CCM] program and team available   Patient screened for showing as readmission less than 7 days hospitalization with noted was transferred to [facility] from [facility] due to Stroke. Review of patient's medical record reveals patient is being recommended for an inpatient rehabilitation transition for intensive rehab per review of PT/OT recommendations, and MD notes as reviewed.   Plan:  Continue to follow progress and disposition to assess for post hospital care management needs as patient will need insurance authorization for rehab.  Can follow periodically if going to inpatient rehab.  For questions contact:   Natividad Brood, RN BSN [facility] Liaison  [phone] business mobile phone Toll free office [phone]  Fax number: [phone] [email or web address]

## 2020-11-04 NOTE — Progress Notes (Signed)
Occupational Therapy Treatment Patient Details Name: Frances Kent MRN: [id number] DOB: 23-Apr-1930 Today's Date: 11/04/2020   History of present illness Pt presented to [facility] on 9/17 with SOB, weakness and heart palpitations. Pt adm for Afib with RVR and mild CHF exacerbation. On 9/18 around 1800 nurse witnessed rt sided weakness and aphasia. Code stroke called. CT showed lt ICA occlusion and pt transferred to [facility] for emergent mechanical thrombectomy 9/18.  PMH: HTN, Afib, PE, and GERD.   OT comments  Frances Kent is making functional progress towards her ADL goals with noted improvement in RUE involvement, decreased assist levels and improved standing tolerance. Pt completed bed mobility at a supervision level and all functional ambulation without AD with min guard - min A for one mild LOB. Pt stood at the sink to complete grooming tasks with min guard and great use of BUE functionally. Pt required rest break after about 8 minutes of activity. Pt continues to benefit from OT acutely.D/c rec remains appropriate.    Recommendations for follow up therapy are one component of a multi-disciplinary discharge planning process, led by the attending physician.  Recommendations may be updated based on patient status, additional functional criteria and insurance authorization.    Follow Up Recommendations  CIR    Equipment Recommendations  3 in 1 bedside commode       Precautions / Restrictions Precautions Precautions: Fall Restrictions Weight Bearing Restrictions: No       Mobility Bed Mobility Overal bed mobility: Needs Assistance Bed Mobility: Supine to Sit     Supine to sit: Supervision     General bed mobility comments: supervision for safety only +HOB elevated and incrased time    Transfers Overall transfer level: Needs assistance Equipment used: None Transfers: Sit to/from Stand Sit to Stand: Min guard         General transfer comment: demonstrated great hand placement for all  sit<>stands this session. min guard for safety only. No AD this session to challenge balance; recommend pt use RW to increase safety without therapy    Balance Overall balance assessment: Needs assistance Sitting-balance support: Feet supported Sitting balance-Leahy Scale: Good     Standing balance support: No upper extremity supported Standing balance-Leahy Scale: Fair Standing balance comment: static standing and dynamic standing while completing functional tasks at the sink with min Guard only for safety                           ADL either performed or assessed with clinical judgement   ADL Overall ADL's : Needs assistance/impaired     Grooming: Min guard;Standing;Wash/dry face;Wash/dry hands;Oral care;Brushing hair Grooming Details (indicate cue type and reason): using BUE WFL for all grooming tasks at the sink; no physical assist require. Min guard for safety                 Toilet Transfer: Minimal assistance;Ambulation Toilet Transfer Details (indicate cue type and reason): simulated by walkign in room with sit<>stand from recliner. min A for 1 mild LOB. noted decreased step length with RLE         Functional mobility during ADLs: Minimal assistance;Cueing for safety General ADL Comments: min A for functional mobility within the room without AD for 1 mild LOB. Pt attempting to reach for external support     Vision   Vision Assessment?: No apparent visual deficits;Yes Ocular Range of Motion: Within Functional Limits Alignment/Gaze Preference: Within Defined Limits Visual Fields: No apparent deficits Additional  Comments: Pt does not report any changes in her vision, brief assessment is Frances Kent   Perception     Praxis      Cognition Arousal/Alertness: Awake/alert Behavior During Therapy: WFL for tasks assessed/performed Overall Cognitive Status: Impaired/Different from baseline                         Following Commands: Follows multi-step  commands consistently     Problem Solving: Slow processing General Comments: Per chart notes, pt seemingly with improved cognition this session. She was able to complete 4 step trail making task with incrased time for processing. Pt also demonstrates good safety awareness reporting she needs someone with her anytime she gets up. pt's daughter verbalized a noted improvement in cognitive tasks and alertness        Exercises     Shoulder Instructions       General Comments VSS on RA; Pt's HR was 115 after ~8 minute standing functional task. recovered to 100s after sitting rest break; SpO2 about 95% entire session    Pertinent Vitals/ Pain       Pain Assessment: Faces Faces Pain Scale: No hurt Pain Intervention(s): Monitored during session  Home Living                                          Prior Functioning/Environment              Frequency  Min 2X/week        Progress Toward Goals  OT Goals(current goals can now be found in the care plan section)  Progress towards OT goals: Progressing toward goals  Acute Rehab OT Goals Patient Stated Goal: do more for herself and get some rehab OT Goal Formulation: With patient Time For Goal Achievement: 11/16/20 Potential to Achieve Goals: Good ADL Goals Pt Will Perform Grooming: with set-up;sitting Pt Will Perform Upper Body Bathing: with set-up;sitting Pt Will Perform Lower Body Bathing: with supervision;sit to/from stand Pt Will Transfer to Toilet: with supervision;bedside commode Pt Will Perform Toileting - Clothing Manipulation and hygiene: with supervision;sitting/lateral leans;sit to/from stand Additional ADL Goal #1: Pt will demonstrate anticipatory awareness during ADL tasks  Plan Discharge plan remains appropriate    Co-evaluation                 AM-PAC OT "6 Clicks" Daily Activity     Outcome Measure   Help from another person eating meals?: A Little Help from another person taking  care of personal grooming?: A Little Help from another person toileting, which includes using toliet, bedpan, or urinal?: A Little Help from another person bathing (including washing, rinsing, drying)?: A Lot Help from another person to put on and taking off regular upper body clothing?: A Little Help from another person to put on and taking off regular lower body clothing?: A Lot 6 Click Score: 16    End of Session Equipment Utilized During Treatment: Gait belt  OT Visit Diagnosis: Unsteadiness on feet (R26.81);Other abnormalities of gait and mobility (R26.89);Muscle weakness (generalized) (M62.81);Low vision, both eyes (H54.2);Other symptoms and signs involving cognitive function   Activity Tolerance Patient tolerated treatment well   Patient Left in chair;with call bell/phone within reach;with family/visitor present   Nurse Communication Mobility status        Time: 0935-1000 OT Time Calculation (min): 25 min  Charges: OT General Charges $OT  Visit: 1 Visit OT Treatments $Self Care/Home Management : 23-37 mins    Trust Crago A Gracey Tolle 11/04/2020, 10:18 AM

## 2020-11-04 NOTE — TOC Transition Note (Signed)
Transition of Care [facility]) - CM/SW Discharge Note   Patient Details  Name: Frances Kent MRN: [id number] Date of Birth: 11/27/1930  [facility] of [facility] CM/SW Contact:  Pollie Friar, RN Phone Number: 11/04/2020, 11:50 AM   Clinical Narrative:    Patient is discharging to CIR today. CM signing off.   Final next level of care: IP Rehab Facility Barriers to Discharge: No Barriers Identified   Patient Goals and CMS Choice        Discharge Placement                       Discharge Plan and Services                                     Social Determinants of Health (SDOH) Interventions     Readmission Risk Interventions Readmission Risk Prevention Plan 11/01/2020  Transportation Screening Complete  PCP or Specialist Appt within 5-7 Days Complete  Medication Review (RN CM) Complete  Some recent data might be hidden

## 2020-11-04 NOTE — Progress Notes (Signed)
Jamse Arn, MD   Physician  Physical Medicine and Rehabilitation  PMR Pre-admission     Signed  Date of Service:  11/03/2020  3:12 PM       Related encounter: Admission (Discharged) from 11/01/2020 in [facility]       Signed      Show:Clear all [x]Written[x]Templated[x]Copied  Added by: [x]Boyette, Vertis Kelch, RN[x]Patel, Domenick Bookbinder, MD  []Hover for details                                                                                                                                                                                                                                                                                                                                                                                                                                        PMR Admission Coordinator Pre-Admission Assessment   Patient: Frances Kent is an 87 y.o., female MRN: [id number] DOB: 01-09-1931 Height:   Weight: 44.5 kg   Insurance Information HMO: yes    PPO:      PCP:      IPA:      80/20:      OTHER:  PRIMARY: [employer]      Policy#: [id number]      Subscriber: pt CM Name: Myriam Jacobson      Phone#: [phone] ext [phone]     Fax#: [phone] Pre-Cert#: [id number] approved for 7  days f/u with Jeanette Caprice ext [phone] and same fax     Employer:  Benefits:  Phone #: [phone]     Name: 9/20 Eff. Date: 02/15/2019     Deduct: none      Out of Pocket Max: $3900      Life Max: none CIR: $295 co pay per day days 1 until 6      SNF: no co pay days 1 until 20; $178 co pay PE day days 21 until 100 Outpatient: $10 to $20 per visit     Co-Pay: visits per medical neccesity Home Health: 100%      Co-Pay: visits per medical neccesity DME: 80%     Co-Pay: 20% Providers: in network  SECONDARY: none      Policy#:      Phone#:     Development worker, community:       Phone#:    The Engineer, petroleum" for patients in Inpatient Rehabilitation Facilities with attached "Privacy Act [place] Records" was provided and verbally reviewed with: Patient and Family   Emergency Contact Information Contact Information       Name Relation Home Work E[place] D Daughter [phone]   [phone]    [facility] Daughter [phone]   [phone]           Current Medical History Patient Admitting Diagnosis: CVA   History of Present Illness:  87 year old female with history of HTN, anxiety, PE/CAF- on Xarelto, SVT, MVR, esophageal stricture s/p dilatation, chronic cough who was admitted to [facility] on 10/30/20 with fatigue, worsening of SOB and palpitations. She was treated for fluid overload, started on IV antibiotics due to concerns of LLL PNA and Cardizem for rate control. On 09/18, she has near syncope with hypotension, EKG with prolonged QTC-588-stable and later that day developed right sided weakness with aphasia, decrease in LOC with right hemianopsia. CTA showed left ICA occlusion into proximal M1 with distal reconstitution and was transferred to [facility] for cerebral angio with thrombectomy of L-ICA and L-MCA with TICI 3 by Dr. Earleen Newport. 2D echo done revealing EF 40-45% with left atrial enlargement and moderate to severe mitral valve regurgitation. Follow up MRI/MRA brain showed tiny acute infarcts in left basal ganglia and patent L-MCA.    Amiodarone on hold due to QT prolongation and Cardizem resumed PCCM recommended 5 day course of ceftriaxone for treatment of CAP and to follow temp curve. Cleared for regular diet per bedside swallow. Stroke felt to be embolic due to A fib.  Patient has been compliant with Xarelto but taking on empty stomach-->she was advised to take with dinner for better absorption.  Repeat EKG with QTC-492 and amiodarone resumed on 09/20 due to ongoing tachycardia. Her  verbal output has improved with mild dysarthria and mild residual RUE weakness. Therapy ongoing and patient limited by weakness, balance deficits with posterior lean as well as SOB/tachycardia with activity.    Complete NIHSS TOTAL: 1   Patient's medical record from [facility] and [facility] has been reviewed by the rehabilitation admission coordinator and physician.   Past Medical History      Past Medical History:  Diagnosis Date   Atrial fibrillation ([place])     Benign breast cyst in female, left 10/07/2016   Colon adenomas     GERD (gastroesophageal reflux disease)     Hypertension     Hypothyroidism     Macular degeneration     Mitral regurgitation     Pulmonary  embolism ([place]) 02/2016   SVT (supraventricular tachycardia) ([place])      Has the patient had major surgery during 100 days prior to admission? Yes   Family History   family history includes Breast cancer (age of onset: 44) in her mother; Cancer (age of onset: 98) in her son; Cancer (age of onset: 54) in her mother; Heart disease in her son; Heart disease (age of onset: 44) in her maternal grandmother; Stroke in her sister.   Current Medications   Current Facility-Administered Medications:    acetaminophen (TYLENOL) tablet 500 mg, 500 mg, Oral, Q6H PRN, Bhagat, Srishti L, MD   amiodarone (PACERONE) tablet 200 mg, 200 mg, Oral, Daily, Rosalin Hawking, MD, 200 mg at 11/04/20 1005   diltiazem (CARDIZEM) tablet 60 mg, 60 mg, Oral, Q6H, Chand, Sudham, MD, 60 mg at 11/04/20 1005   docusate sodium (COLACE) capsule 100 mg, 100 mg, Oral, BID PRN, Bhagat, Srishti L, MD   ezetimibe (ZETIA) tablet 10 mg, 10 mg, Oral, Daily, Rosalin Hawking, MD, 10 mg at 11/04/20 1005   feeding supplement (BOOST / RESOURCE BREEZE) liquid 1 Container, 1 Container, Oral, TID BM, Garvin Fila, MD, Given at 11/04/20 1005   insulin aspart (novoLOG) injection 0-6 Units, 0-6 Units, Subcutaneous, TID WC & HS, Bhagat, Srishti L, MD   ipratropium (ATROVENT)  nebulizer solution 0.5 mg, 0.5 mg, Nebulization, Q6H PRN, Bhagat, Srishti L, MD   levothyroxine (SYNTHROID) tablet 50 mcg, 50 mcg, Oral, Q0600, Bhagat, Srishti L, MD, 50 mcg at 11/04/20 0525   polyethylene glycol (MIRALAX / GLYCOLAX) packet 17 g, 17 g, Oral, Daily PRN, Bhagat, Srishti L, MD   Rivaroxaban (XARELTO) tablet 15 mg, 15 mg, Oral, Q supper, [place], Eden E, RPH, 15 mg at 11/03/20 1805   Patients Current Diet:  Diet Order                  Diet regular Room service appropriate? Yes with Assist; Fluid consistency: Thin  Diet effective now                       Precautions / Restrictions Precautions Precautions: Fall Precaution Comments: R visual field deficit (also has macular degeneration) Restrictions Weight Bearing Restrictions: No    Has the patient had 2 or more falls or a fall with injury in the past year? No   Prior Activity Level Community (5-7x/wk): Independent, very active; decline in function over past few months   Prior Functional Level Self Care: Did the patient need help bathing, dressing, using the toilet or eating? Independent   Indoor Mobility: Did the patient need assistance with walking from room to room (with or without device)? Independent   Stairs: Did the patient need assistance with internal or external stairs (with or without device)? Independent   Functional Cognition: Did the patient need help planning regular tasks such as shopping or remembering to take medications? Independent   Patient Information Are you of Hispanic, Latino/a,or Spanish origin?: A. No, not of Hispanic, Latino/a, or Spanish origin What is your race?: A. White Do you need or want an interpreter to communicate with a doctor or health care staff?: 0. No   Patient's Response To:  Health Literacy and Transportation Is the patient able to respond to health literacy and transportation needs?: Yes Health Literacy - How often do you need to have someone help you when you  read instructions, pamphlets, or other written material from your doctor or pharmacy?: Never In the past  12 months, has lack of transportation kept you from medical appointments or from getting medications?: No In the past 12 months, has lack of transportation kept you from meetings, work, or from getting things needed for daily living?: No   Home Assistive Devices / [place]: Environmental consultant - 4 wheels, Shower seat, Grab bars - tub/shower   Prior Device Use: Indicate devices/aids used by the patient prior to current illness, exacerbation or injury?  Rollator in community, no AD at home   Current Functional Level Cognition   Arousal/Alertness: Awake/alert Overall Cognitive Status: Impaired/Different from baseline Current Attention Level: Selective Orientation Level: Oriented X4 Following Commands: Follows multi-step commands consistently Safety/Judgement: Decreased awareness of deficits, Decreased awareness of safety General Comments: Pt fatigued after working with OT but agreeable to participate, pt with decreased R visual field and difficulty looking to R side without max cues. Encouraged her daughter [name]) to stand on her R side when engaging her in conversation to promote R awareness. Memory: Impaired Memory Impairment: Storage deficit, Retrieval deficit Awareness: Impaired Awareness Impairment: Anticipatory impairment, Emergent impairment Problem Solving:  (diagnostic treatment) Safety/Judgment: Impaired    Extremity Assessment (includes Sensation/Coordination)   Upper Extremity Assessment: Generalized weakness RUE Deficits / Details: AROM is WFL; BUE are generally 3+/5 with equal strength bilaterally. Sensation is WFL. Pt used RUE functionally throughout session without cueing during grooming tasks RUE Sensation: WNL RUE Coordination: WNL  Lower Extremity Assessment: Defer to PT evaluation     ADLs   Overall ADL's : Needs assistance/impaired Eating/Feeding: Minimal  assistance Grooming: Min guard, Standing, Wash/dry face, Wash/dry hands, Oral care, Brushing hair Grooming Details (indicate cue type and reason): using BUE WFL for all grooming tasks at the sink; no physical assist require. Min guard for safety Upper Body Bathing: Moderate assistance Lower Body Bathing: Moderate assistance Upper Body Dressing : Moderate assistance Lower Body Dressing: Maximal assistance Toilet Transfer: Minimal assistance, Ambulation Toilet Transfer Details (indicate cue type and reason): simulated by walkign in room with sit<>stand from recliner. min A for 1 mild LOB. noted decreased step length with RLE Toileting- Clothing Manipulation and Hygiene: Maximal assistance Functional mobility during ADLs: Minimal assistance, Cueing for safety General ADL Comments: min A for functional mobility within the room without AD for 1 mild LOB. Pt attempting to reach for external support     Mobility   Overal bed mobility: Needs Assistance Bed Mobility: Supine to Sit Sidelying to sit: Min assist Supine to sit: Supervision Sit to supine: Mod assist (Lift BLE back onto bed) General bed mobility comments: pt received/remained in recliner     Transfers   Overall transfer level: Needs assistance Equipment used: None, Rolling walker (2 wheeled) Transfers: Sit to/from Stand Sit to Stand: Min guard Stand pivot transfers: Mod assist General transfer comment: Pt trying to pull up on RW when placed in front of chair, needs cues for proper hand placement prior to standing and also prior to sitting. Pt with improved carryover of hand placement with reciprocal sit<>stand practice.     Ambulation / Gait / Stairs / Wheelchair Mobility   Ambulation/Gait Ambulation/Gait assistance: Herbalist (Feet): 50 Feet Assistive device: Rolling walker (2 wheeled) Gait Pattern/deviations: Step-through pattern, Decreased stride length, Trunk flexed, Narrow base of support, Shuffle, Decreased  step length - right, Decreased dorsiflexion - right, Decreased weight shift to right, Drifts right/left General Gait Details: minA for walker management, pt with noted decreased R awareness/visual field deficit, needs max cues to attend to R side and  drifting to R throughout. max cues for improved step length/height on RLE and tending to shuffle RLE with fatigue. Gait velocity: decr Gait velocity interpretation: <1.31 ft/sec, indicative of household ambulator     Posture / Balance Balance Overall balance assessment: Needs assistance Sitting-balance support: Feet supported Sitting balance-Leahy Scale: Good Standing balance support: No upper extremity supported Standing balance-Leahy Scale: Fair Standing balance comment: static standing and dynamic standing while completing functional tasks at the sink with min Guard only for safety     Special needs/care consideration      Previous Home Environment  Living Arrangements:  (grand daughter and her 2 great grandchildren)  Lives With: Family Available Help at Discharge: Family, Available 24 hours/day Type of Home: House Home Layout: Laundry or work area in basement, Able to live on main level with bedroom/bathroom Home Access: Level entry Bathroom Shower/Tub: Multimedia programmer: Standard Bathroom Accessibility: No Gary: No Additional Comments: Reports not using rollator, does use grab bars and shower chair for safety.   Discharge Living Setting Plans for Discharge Living Setting: Patient's home, Lives with (comment) (granddaughter) Type of Home at Discharge: House Discharge Home Layout: One level, Laundry or work area in basement Discharge Home Access: Level entry Discharge Bathroom Shower/Tub: Walk-in shower Discharge Bathroom Toilet: Standard Discharge Bathroom Accessibility: Yes How Accessible: Accessible via walker Does the patient have any problems obtaining your medications?: No   Social/Family/Support  Systems Contact Information: daughters, [name] is main contact Anticipated Caregiver: [name] and her daughter, [place] Anticipated Ambulance person Information: see above Ability/Limitations of Caregiver: none Caregiver Availability: 24/7 Discharge Plan Discussed with Primary Caregiver: Yes Is Caregiver In Agreement with Plan?: Yes Does Caregiver/Family have Issues with Lodging/Transportation while Pt is in Rehab?: No   Goals Patient/Family Goal for Rehab: supervision PT, OT and SLP Expected length of stay: ELOS 7 to 10 days Pt/Family Agrees to Admission and willing to participate: Yes Program Orientation Provided & Reviewed with Pt/Caregiver Including Roles  & Responsibilities: Yes   Patient has follow up cardiology appointment  that they are aiming not to miss at [facility] clinic. She will likely only be at CIR 4 to 5 days.   Decrease burden of Care through IP rehab admission: n/a   Possible need for SNF placement upon discharge: not anticipated   Patient Condition: I have reviewed medical records from [facility] and [facility], spoken with CM, and patient and daughter. I met with patient at the bedside for inpatient rehabilitation assessment.  Patient will benefit from ongoing PT, OT, and SLP, can actively participate in 3 hours of therapy a day 5 days of the week, and can make measurable gains during the admission.  Patient will also benefit from the coordinated team approach during an Inpatient Acute Rehabilitation admission.  The patient will receive intensive therapy as well as Rehabilitation physician, nursing, social worker, and care management interventions.  Due to bladder management, bowel management, safety, skin/wound care, disease management, medication administration, pain management, and patient education the patient requires 24 hour a day rehabilitation nursing.  The patient is currently min assist overall with mobility and basic ADLs.  Discharge setting and therapy post  discharge at home with home health is anticipated.  Patient has agreed to participate in the Acute Inpatient Rehabilitation Program and will admit today.   Preadmission Screen Completed By:  Cleatrice Burke, 11/04/2020 12:00 PM ______________________________________________________________________   Discussed status with Dr. Posey Pronto  on 11/04/2020 at 1141 and received approval for admission today.  Admission Coordinator:  Cleatrice Burke, RN, time  8889 Date  11/04/2020    Assessment/Plan: Diagnosis: CVA Does the need for close, 24 hr/day Medical supervision in concert with the patient's rehab needs make it unreasonable for this patient to be served in a less intensive setting? Yes Co-Morbidities requiring supervision/potential complications: HTN, anxiety, PE/CAF- on Xarelto, SVT, MVR, esophageal stricture s/p dilatation, chronic cough  Due to safety, disease management, medication administration, and patient education, does the patient require 24 hr/day rehab nursing? Yes Does the patient require coordinated care of a physician, rehab nurse, PT, OT, and SLP to address physical and functional deficits in the context of the above medical diagnosis(es)? Yes Addressing deficits in the following areas: balance, endurance, locomotion, strength, bathing, dressing, cognition, and psychosocial support Can the patient actively participate in an intensive therapy program of at least 3 hrs of therapy 5 days a week? Yes The potential for patient to make measurable gains while on inpatient rehab is excellent and good Anticipated functional outcomes upon discharge from inpatient rehab: supervision PT, supervision OT, supervision SLP Estimated rehab length of stay to reach the above functional goals is: 7-10 days. Anticipated discharge destination: Home 10. Overall Rehab/Functional Prognosis: good     MD Signature: Delice Lesch, MD, ABPMR         Revision History                                     Note Details  Author Jamse Arn, MD File Time 11/04/2020 12:08 PM  Author Type Physician Status Signed  Last Editor Jamse Arn, MD Service Physical Medicine and [place] # [id number] Admit Date 11/04/2020

## 2020-11-04 NOTE — Care Management Important Message (Signed)
Important Message  Patient Details  Name: Frances Kent MRN: [id number] Date of Birth: 21-Sep-1930   Medicare Important Message Given:  Yes     Geraldin Habermehl [address] 11/04/2020, 11:45 AM

## 2020-11-04 NOTE — Progress Notes (Signed)
Received from 4N ICU in w/c accompanied by staff.  Oriented to room and surroundings.

## 2020-11-04 NOTE — Progress Notes (Signed)
Physical Therapy Treatment Patient Details Name: Frances Kent MRN: [id number] DOB: [birth year] Today's Date: 11/04/2020   History of Present Illness Pt presented to [facility] on 9/17 with SOB, weakness and heart palpitations. Pt adm for Afib with RVR and mild CHF exacerbation. On 9/18 around 1800 nurse witnessed rt sided weakness and aphasia. Code stroke called. CT showed lt ICA occlusion and pt transferred to [facility] for emergent mechanical thrombectomy 9/18. PMH: HTN, Afib, macular degeneration, PE, and GERD.    PT Comments    Pt received in chair, agreeable to therapy session and with good participation in gait and transfer training as well as seated exercises. Emphasis on safe hand placement with transfers, improved RLE step length/height, R sided awareness/attention, safety with RW use. Pt reports 6/10 modified RPE post-session. Pt continues to benefit from PT services to progress toward functional mobility goals. Continue to recommend CIR as pt remains below functional baseline and continues to make good progress toward mobility goals.  Recommendations for follow up therapy are one component of a multi-disciplinary discharge planning process, led by the attending physician.  Recommendations may be updated based on patient status, additional functional criteria and insurance authorization.  Follow Up Recommendations  CIR     Equipment Recommendations  Rolling walker with 5" wheels    Recommendations for Other Services       Precautions / Restrictions Precautions Precautions: Fall Precaution Comments: R visual field deficit (also has macular degeneration) Restrictions Weight Bearing Restrictions: No     Mobility  Bed Mobility Overal bed mobility: Needs Assistance Bed Mobility: Supine to Sit     Supine to sit: Supervision     General bed mobility comments: pt received/remained in recliner    Transfers Overall transfer level: Needs assistance Equipment used: None;Rolling  walker (2 wheeled) Transfers: Sit to/from Stand Sit to Stand: Min guard         General transfer comment: Pt trying to pull up on RW when placed in front of chair, needs cues for proper hand placement prior to standing and also prior to sitting. Pt with improved carryover of hand placement with reciprocal sit<>stand practice.  Ambulation/Gait Ambulation/Gait assistance: Min assist Gait Distance (Feet): 50 Feet Assistive device: Rolling walker (2 wheeled) Gait Pattern/deviations: Step-through pattern;Decreased stride length;Trunk flexed;Narrow base of support;Shuffle;Decreased step length - right;Decreased dorsiflexion - right;Decreased weight shift to right;Drifts right/left     General Gait Details: minA for walker management, pt with noted decreased R awareness/visual field deficit, needs max cues to attend to R side and drifting to R throughout. max cues for improved step length/height on RLE and tending to shuffle RLE with fatigue.   Stairs             Wheelchair Mobility    Modified Rankin (Stroke Patients Only)       Balance Overall balance assessment: Needs assistance Sitting-balance support: Feet supported Sitting balance-Leahy Scale: Good     Standing balance support: No upper extremity supported Standing balance-Leahy Scale: Fair Standing balance comment: static standing and dynamic standing while completing functional tasks at the sink with min Guard only for safety              Cognition Arousal/Alertness: Awake/alert Behavior During Therapy: WFL for tasks assessed/performed Overall Cognitive Status: Impaired/Different from baseline Area of Impairment: Memory;Safety/judgement;Problem solving           Memory: Decreased short-term memory Following Commands: Follows multi-step commands consistently Safety/Judgement: Decreased awareness of deficits;Decreased awareness of safety   Problem Solving: Slow  processing;Requires verbal cues General  Comments: Pt fatigued after working with OT but agreeable to participate, pt with decreased R visual field and difficulty looking to R side without max cues. Encouraged her daughter Frances Kent) to stand on her R side when engaging her in conversation to promote R awareness.      Exercises Other Exercises Other Exercises: STS x 3 reciprocal reps, RPE 6/10 post-session Other Exercises: seated BLE AROM: ankle pumps, hip flexion, LAQ x10 reps ea    General Comments General comments (skin integrity, edema, etc.): HR 64-88 bpm seated in chair, HR to 108 bpm (noted Afib per chart review), no dizziness with transfers and BP 117/70 seated prior to mobility; SpO2 WNL on RA resting and with exertion.      Pertinent Vitals/Pain Pain Assessment: Faces Faces Pain Scale: No hurt Pain Intervention(s): Monitored during session;Repositioned     PT Goals (current goals can now be found in the care plan section) Acute Rehab PT Goals Patient Stated Goal: do more for herself and get some rehab PT Goal Formulation: With patient Time For Goal Achievement: 11/16/20 Potential to Achieve Goals: Good Progress towards PT goals: Progressing toward goals    Frequency    Min 4X/week      PT Plan Current plan remains appropriate       AM-PAC PT "6 Clicks" Mobility   Outcome Measure  Help needed turning from your back to your side while in a flat bed without using bedrails?: A Little Help needed moving from lying on your back to sitting on the side of a flat bed without using bedrails?: A Little Help needed moving to and from a bed to a chair (including a wheelchair)?: A Little Help needed standing up from a chair using your arms (e.g., wheelchair or bedside chair)?: A Little Help needed to walk in hospital room?: A Little Help needed climbing 3-5 steps with a railing? : A Lot 6 Click Score: 17    End of Session Equipment Utilized During Treatment: Gait belt Activity Tolerance: Patient tolerated treatment  well;Patient limited by fatigue (fatigued from recent OT session just prior to PT session) Patient left: in chair;with call bell/phone within reach;with chair alarm set;with family/visitor present Nurse Communication: Mobility status PT Visit Diagnosis: Unsteadiness on feet (R26.81);Muscle weakness (generalized) (M62.81);Difficulty in walking, not elsewhere classified (R26.2)     Time: 5361-4431 PT Time Calculation (min) (ACUTE ONLY): 28 min  Charges:  $Gait Training: 8-22 mins $Therapeutic Exercise: 8-22 mins                     Jereline Ticer P., PTA Acute Rehabilitation Services Pager: [phone] Office: [place] 11/04/2020, 11:09 AM

## 2020-11-04 NOTE — H&P (Signed)
Physical Medicine and Rehabilitation Admission H&P    CC: Functional deficits due to stroke.    HPI: Frances Kent. Seiler is an 87 year old female with history of HTN, anxiety, PE/CAF- on Xarelto (Dr. [name]), SVT, MVR, esophageal stricture s/p dilatation 10/21, Covid 1/22 w/ chronic cough,  malaise X weeks who was admitted to [facility] on 10/30/2020 with fatigue, worsening of SOB and palpitations.  History taken from patient, daughter, and chart review due to cognition.  She was treated for fluid overload, started on IV antibiotics due to concerns of left lower lobe pneumonia and Cardizem for rate control. On 11/01/2020, she has near syncope with hypotension, EKG with prolonged QTC-588-stable and later that day developed right sided hemiparesis with aphasia, decrease in LOC with right hemianopsia. CTA showed left ICA occlusion into proximal M1 with distal reconstitution and was transferred to [facility] for cerebral angio with thrombectomy of L-ICA and L-MCA with TICI 3 by Dr. Earleen Newport.  Echocardiogram with ejection fraction of 40-45%, left atrial enlargement and moderate to severe mitral valve regurgitation. Follow up MRI/MRA brain showed tiny acute infarcts in left basal ganglia and patent L-MCA.   Amiodarone on hold due to QT prolongation and Cardizem resumed PCCM recommended 5 day course of ceftriaxone for treatment of CAP and to follow temp curve. Cleared for regular diet per bedside swallow. Stroke felt to be embolic due to atrial fibrillation.  Patient has been compliant with Xarelto but taking on empty stomach-->she was advised to take with dinner for better absorption.  Repeat EKG with QTC-492 and amiodarone resumed on 09/20 due to ongoing tachycardia. Her verbal output has improved with mild dysarthria and mild residual RUE hemiparesis. Therapy ongoing and patient limited by weakness, balance deficits with posterior lean as well as SOB/tachycardia with activity. CIR recommended due to functional decline.   Please see preadmission assessment from earlier today as well.   Review of Systems  Constitutional:  Positive for malaise/fatigue and weight loss. Negative for chills and fever.  HENT:  Negative for hearing loss and tinnitus.   Eyes:  Negative for blurred vision and double vision.       Vision stable with ongoing injections for  macular degeneration.   Respiratory:  Positive for cough. Negative for sputum production and shortness of breath.   Cardiovascular:  Negative for chest pain, palpitations and leg swelling.  Gastrointestinal:  Positive for heartburn. Negative for abdominal pain and constipation.  Musculoskeletal:  Negative for back pain, joint pain and myalgias.  Neurological:  Positive for weakness. Negative for dizziness, speech change, focal weakness and headaches.  All other systems reviewed and are negative.   Past Medical History:  Diagnosis Date   Atrial fibrillation ([place])    Benign breast cyst in female, left 10/07/2016   Colon adenomas    GERD (gastroesophageal reflux disease)    Hypertension    Hypothyroidism    Macular degeneration    Mitral regurgitation    Pulmonary embolism ([place]) 02/2016   SVT (supraventricular tachycardia) ([place])     Past Surgical History:  Procedure Laterality Date   APPENDECTOMY  1960   BREAST CYST ASPIRATION Left 2017   CARDIOVERSION N/A 04/05/2016   Procedure: Cardioversion;  Surgeon: [name], MD;  Location: [facility] ORS;  Service: Cardiovascular;  Laterality: N/A;   CHOLECYSTECTOMY  1985   ESOPHAGOGASTRODUODENOSCOPY (EGD) WITH PROPOFOL N/A 11/27/2019   Procedure: ESOPHAGOGASTRODUODENOSCOPY (EGD) WITH PROPOFOL;  Surgeon: Lesly Rubenstein, MD;  Location: [facility] END[facility];  Service: Endoscopy;  Laterality: N/A;  ESOPHAGOGASTRODUODENOSCOPY (EGD) WITH PROPOFOL N/A 12/24/2019   Procedure: ESOPHAGOGASTRODUODENOSCOPY (EGD) WITH PROPOFOL;  Surgeon: Lesly Rubenstein, MD;  Location: [facility] ENDOS[facility];  Service: Endoscopy;  Laterality: N/A;    IR CT HEAD LTD  11/01/2020   IR PERCUTANEOUS ART THROMBECTOMY/INFUSION INTRACRANIAL INC DIAG ANGIO  11/01/2020   IR US GUIDE VASC ACCESS RIGHT  11/02/2020   OVARIAN CYST REMOVAL     RADIOLOGY WITH ANESTHESIA N/A 11/01/2020   Procedure: RADIOLOGY WITH ANESTHESIA;  Surgeon: Luanne Bras, MD;  Location: [facility];  Service: Radiology;  Laterality: N/A;   TEE WITHOUT CARDIOVERSION N/A 02/01/2018   Procedure: TRANSESOPHAGEAL ECHOCARDIOGRAM (TEE);  Surgeon: [name], MD;  Location: [facility];  Service: Cardiovascular;  Laterality: N/A;    Family History  Problem Relation Age of Onset   Cancer Mother 88       breast cancer, lived to 49,    Breast cancer Mother 70   Cancer Son 7       pancreatic cancer   Heart disease Son        CAD, Tobacco Abuse    Heart disease Maternal Grandmother 80       died of massive MI   Stroke Sister     Social History: Has great granddaughter w/kids who stays with her. Daughters bring in meals and check on her daily. She has been homebound for past few weeks/months due to malaise. She reports that she has never smoked. She has never used smokeless tobacco. She reports that she does not drink alcohol and does not use drugs.   Allergies  Allergen Reactions   Statins     Severe myalgias    Medications Prior to Admission  Medication Sig Dispense Refill   acetaminophen (TYLENOL) 500 MG tablet Take 500 mg by mouth every 6 (six) hours as needed.     ALPRAZolam (XANAX) 0.25 MG tablet Take 1 tablet (0.25 mg total) by mouth at bedtime as needed for anxiety or sleep. 20 tablet 3   amiodarone (PACERONE) 200 MG tablet Take 200 mg by mouth daily.     benzonatate (TESSALON) 200 MG capsule Take 1 capsule (200 mg total) by mouth 3 (three) times daily as needed for cough. 60 capsule 1   cefTRIAXone 2 g in sodium chloride 0.9 % 100 mL Inject 2 g into the vein daily for 2 days.     chlorpheniramine-HYDROcodone (TUSSIONEX) 10-8 MG/5ML SUER Take 5 mLs by mouth every  12 (twelve) hours as needed for cough. 140 mL 0   diltiazem (CARDIZEM) 30 MG tablet Take 1 tablet (30 mg total) by mouth every 8 (eight) hours. (Patient taking differently: Take 30 mg by mouth 2 (two) times daily.) 90 tablet 0   esomeprazole (NEXIUM) 40 MG capsule Take 1 capsule (40 mg total) by mouth daily. 30 capsule 3   ipratropium (ATROVENT) 0.02 % nebulizer solution Take 2.5 mLs (0.5 mg total) by nebulization every 4 (four) hours as needed for wheezing or shortness of breath (cough). 75 mL 0   levothyroxine (EUTHYROX) 50 MCG tablet TAKE 1 TABLET BY MOUTH ONCE DAILY 30  MINUTES  PRIOR  TO  EATING; 90 tablet 0   potassium chloride SA (KLOR-CON) 20 MEQ tablet Take 20 mEq by mouth daily.     XARELTO 15 MG TABS tablet TAKE 1 TABLET EVERY DAY 60 tablet 1    Drug Regimen Review  Drug regimen was reviewed and remains appropriate with no significant issues identified  Home: Home Living Family/patient expects to be  discharged to:: Private residence Living Arrangements:  (grand daughter and her 2 great grandchildren) Available Help at Discharge: Family, Available 24 hours/day Type of Home: House Home Access: Level entry Home Layout: Laundry or work area in basement, Able to live on main level with bedroom/bathroom Bathroom Shower/Tub: Multimedia programmer: Standard Bathroom Accessibility: No Home Equipment: Environmental consultant - 4 wheels, Shower seat, Grab bars - tub/shower Additional Comments: Reports not using rollator, does use grab bars and shower chair for safety.  Lives With: Family   Functional History: Prior Function Level of Independence: Independent Gait / Transfers Assistance Needed: reports ambulating short distances only. ADL's / Homemaking Assistance Needed: Able to perform all I/ADLs. Has rarely driven lately in past 1-2 weeks d/t fatigue, but is able to drive at baseline. Family assists with laundry as it is in the basement  Functional Status:  Mobility: Bed Mobility Overal  bed mobility: Needs Assistance Bed Mobility: Supine to Sit Sidelying to sit: Min assist Supine to sit: Supervision Sit to supine: Mod assist (Lift BLE back onto bed) General bed mobility comments: supervision for safety only +HOB elevated and incrased time Transfers Overall transfer level: Needs assistance Equipment used: None Transfers: Sit to/from Stand Sit to Stand: Min guard Stand pivot transfers: Mod assist General transfer comment: demonstrated great hand placement for all sit<>stands this session. min guard for safety only. No AD this session to challenge balance; recommend pt use RW to increase safety without therapy Ambulation/Gait Ambulation/Gait assistance: Min assist Gait Distance (Feet): 50 Feet Assistive device: Rolling walker (2 wheeled) Gait Pattern/deviations: Step-through pattern, Decreased stride length, Trunk flexed, Narrow base of support General Gait Details: minA for walker management, standing rest break 1/2 way through due to "I'm SOB and my legs are tired." HR 100-139bpm, noted pt in afib - RN aware, pt slow pace, fatigues quickly Gait velocity: decr Gait velocity interpretation: <1.31 ft/sec, indicative of household ambulator    ADL: ADL Overall ADL's : Needs assistance/impaired Eating/Feeding: Minimal assistance Grooming: Min guard, Standing, Wash/dry face, Wash/dry hands, Oral care, Brushing hair Grooming Details (indicate cue type and reason): using BUE WFL for all grooming tasks at the sink; no physical assist require. Min guard for safety Upper Body Bathing: Moderate assistance Lower Body Bathing: Moderate assistance Upper Body Dressing : Moderate assistance Lower Body Dressing: Maximal assistance Toilet Transfer: Minimal assistance, Ambulation Toilet Transfer Details (indicate cue type and reason): simulated by walkign in room with sit<>stand from recliner. min A for 1 mild LOB. noted decreased step length with RLE Toileting- Clothing Manipulation  and Hygiene: Maximal assistance Functional mobility during ADLs: Minimal assistance, Cueing for safety General ADL Comments: min A for functional mobility within the room without AD for 1 mild LOB. Pt attempting to reach for external support  Cognition: Cognition Overall Cognitive Status: Impaired/Different from baseline Arousal/Alertness: Awake/alert Orientation Level: Oriented X4 Year: 2022 Day of Week: Correct Memory: Impaired Memory Impairment: Storage deficit, Retrieval deficit Awareness: Impaired Awareness Impairment: Anticipatory impairment, Emergent impairment Problem Solving:  (diagnostic treatment) Safety/Judgment: Impaired Cognition Arousal/Alertness: Awake/alert Behavior During Therapy: WFL for tasks assessed/performed Overall Cognitive Status: Impaired/Different from baseline Area of Impairment: Attention, Following commands, Memory, Safety/judgement, Awareness, Problem solving Current Attention Level: Selective Memory: Decreased short-term memory Following Commands: Follows multi-step commands consistently Safety/Judgement: Decreased awareness of safety, Decreased awareness of deficits Awareness: Emergent Problem Solving: Slow processing General Comments: Per chart notes, pt seemingly with improved cognition this session. She was able to complete 4 step trail making task with incrased time for processing.  Pt also demonstrates good safety awareness reporting she needs someone with her anytime she gets up. pt's daughter verbalized a noted improvement in cognitive tasks and alertness   Blood pressure 117/70, pulse 77, temperature 98.7 F (37.1 C), temperature source Oral, resp. rate 16, weight 44.5 kg, SpO2 97 %. Physical Exam Vitals reviewed.  Constitutional:      Appearance: She is underweight.     Comments: Frail  HENT:     Head: Normocephalic and atraumatic.     Right Ear: External ear normal.     Left Ear: External ear normal.     Nose: Nose normal.  Eyes:      General:        Right eye: No discharge.        Left eye: No discharge.     Extraocular Movements: Extraocular movements intact.  Cardiovascular:     Comments: Irregularly irregular Pulmonary:     Effort: Pulmonary effort is normal. No respiratory distress.     Breath sounds: No stridor. No rhonchi.  Abdominal:     General: Abdomen is flat. Bowel sounds are normal. There is no distension.  Musculoskeletal:     Cervical back: Normal range of motion and neck supple.     Comments: No edema or tenderness in extremities  Skin:    General: Skin is warm and dry.  Neurological:     Mental Status: She is alert.     Comments: Alert and oriented x2 Motor: RUE/RLE: 4+/5 proximal distal LUE/LE: 4/5 proximal to distal  Psychiatric:        Mood and Affect: Affect is flat.        Speech: Speech is delayed.        Behavior: Behavior is slowed.    Results for orders placed or performed during the hospital encounter of 11/01/20 (from the past 48 hour(s))  Glucose, capillary     Status: None   Collection Time: 11/02/20 12:32 PM  Result Value Ref Range   Glucose-Capillary 91 70 - 99 mg/dL    Comment: Glucose reference range applies only to samples taken after fasting for at least 8 hours.  Zinc     Status: None   Collection Time: 11/02/20  3:51 PM  Result Value Ref Range   Zinc 66 44 - 115 ug/dL    Comment: (NOTE) This test was developed and its performance characteristics determined by [facility]. It has not been cleared or approved by the Food and Drug Administration.                                Detection Limit = 5 Performed At: [facility] [address] [place], [place] [id number] Rush Farmer MD [id number]   Copper, serum     Status: None   Collection Time: 11/02/20  3:51 PM  Result Value Ref Range   Copper 156 80 - 158 ug/dL    Comment: (NOTE) This test was developed and its performance characteristics determined by [facility]. It has not been cleared or  approved by the Food and Drug Administration.                                Detection Limit = 5 Performed At: [facility] [address] [place], [place] [id number] Rush Farmer MD [id number]   Glucose, capillary     Status: None   Collection  Time: 11/02/20  4:07 PM  Result Value Ref Range   Glucose-Capillary 80 70 - 99 mg/dL    Comment: Glucose reference range applies only to samples taken after fasting for at least 8 hours.  Glucose, capillary     Status: Abnormal   Collection Time: 11/02/20  7:50 PM  Result Value Ref Range   Glucose-Capillary 125 (H) 70 - 99 mg/dL    Comment: Glucose reference range applies only to samples taken after fasting for at least 8 hours.  Glucose, capillary     Status: Abnormal   Collection Time: 11/02/20 11:28 PM  Result Value Ref Range   Glucose-Capillary 101 (H) 70 - 99 mg/dL    Comment: Glucose reference range applies only to samples taken after fasting for at least 8 hours.  CBC     Status: Abnormal   Collection Time: 11/03/20  1:18 AM  Result Value Ref Range   WBC 6.7 4.0 - 10.5 K/uL   RBC 3.29 (L) 3.87 - 5.11 MIL/uL   Hemoglobin 9.2 (L) 12.0 - 15.0 g/dL   HCT 29.5 (L) 36.0 - 46.0 %   MCV 89.7 80.0 - 100.0 fL   MCH 28.0 26.0 - 34.0 pg   MCHC 31.2 30.0 - 36.0 g/dL   RDW 18.0 (H) 11.5 - 15.5 %   Platelets 332 150 - 400 K/uL   nRBC 0.0 0.0 - 0.2 %    Comment: Performed at [facility] [address], [place], [place] [address]  Basic metabolic panel     Status: Abnormal   Collection Time: 11/03/20  1:18 AM  Result Value Ref Range   Sodium 139 135 - 145 mmol/L   Potassium 3.6 3.5 - 5.1 mmol/L   Chloride 106 98 - 111 mmol/L   CO2 26 22 - 32 mmol/L   Glucose, Bld 86 70 - 99 mg/dL    Comment: Glucose reference range applies only to samples taken after fasting for at least 8 hours.   BUN 12 8 - 23 mg/dL   Creatinine, Ser 0.94 0.44 - 1.00 mg/dL   Calcium 8.1 (L) 8.9 - 10.3 mg/dL   GFR, Estimated 58 (L) >60 mL/min     Comment: (NOTE) Calculated using the CKD-EPI Creatinine Equation (2021)    Anion gap 7 5 - 15    Comment: Performed at [facility] [address], [place], [place] [address]  Glucose, capillary     Status: Abnormal   Collection Time: 11/03/20  3:28 AM  Result Value Ref Range   Glucose-Capillary 104 (H) 70 - 99 mg/dL    Comment: Glucose reference range applies only to samples taken after fasting for at least 8 hours.  Glucose, capillary     Status: Abnormal   Collection Time: 11/03/20  7:53 AM  Result Value Ref Range   Glucose-Capillary 116 (H) 70 - 99 mg/dL    Comment: Glucose reference range applies only to samples taken after fasting for at least 8 hours.  Glucose, capillary     Status: Abnormal   Collection Time: 11/03/20 12:00 PM  Result Value Ref Range   Glucose-Capillary 109 (H) 70 - 99 mg/dL    Comment: Glucose reference range applies only to samples taken after fasting for at least 8 hours.  Glucose, capillary     Status: Abnormal   Collection Time: 11/03/20  5:13 PM  Result Value Ref Range   Glucose-Capillary 115 (H) 70 - 99 mg/dL    Comment: Glucose reference  range applies only to samples taken after fasting for at least 8 hours.  Glucose, capillary     Status: Abnormal   Collection Time: 11/03/20  7:53 PM  Result Value Ref Range   Glucose-Capillary 127 (H) 70 - 99 mg/dL    Comment: Glucose reference range applies only to samples taken after fasting for at least 8 hours.  Glucose, capillary     Status: Abnormal   Collection Time: 11/04/20 12:39 AM  Result Value Ref Range   Glucose-Capillary 100 (H) 70 - 99 mg/dL    Comment: Glucose reference range applies only to samples taken after fasting for at least 8 hours.  CBC     Status: Abnormal   Collection Time: 11/04/20  3:25 AM  Result Value Ref Range   WBC 8.0 4.0 - 10.5 K/uL   RBC 3.27 (L) 3.87 - 5.11 MIL/uL   Hemoglobin 9.0 (L) 12.0 - 15.0 g/dL   HCT 29.4 (L) 36.0 - 46.0 %   MCV 89.9 80.0 - 100.0 fL   MCH  27.5 26.0 - 34.0 pg   MCHC 30.6 30.0 - 36.0 g/dL   RDW 17.7 (H) 11.5 - 15.5 %   Platelets 331 150 - 400 K/uL   nRBC 0.0 0.0 - 0.2 %    Comment: Performed at [facility] [address] [place], [place] [address]  Basic metabolic panel     Status: Abnormal   Collection Time: 11/04/20  3:25 AM  Result Value Ref Range   Sodium 140 135 - 145 mmol/L   Potassium 4.4 3.5 - 5.1 mmol/L   Chloride 107 98 - 111 mmol/L   CO2 27 22 - 32 mmol/L   Glucose, Bld 99 70 - 99 mg/dL    Comment: Glucose reference range applies only to samples taken after fasting for at least 8 hours.   BUN 9 8 - 23 mg/dL   Creatinine, Ser 0.93 0.44 - 1.00 mg/dL   Calcium 8.4 (L) 8.9 - 10.3 mg/dL   GFR, Estimated 59 (L) >60 mL/min    Comment: (NOTE) Calculated using the CKD-EPI Creatinine Equation (2021)    Anion gap 6 5 - 15    Comment: Performed at [facility] [address], [place], [place] [address]  Glucose, capillary     Status: Abnormal   Collection Time: 11/04/20  6:27 AM  Result Value Ref Range   Glucose-Capillary 105 (H) 70 - 99 mg/dL    Comment: Glucose reference range applies only to samples taken after fasting for at least 8 hours.   No results found.     Medical Problem List and Plan: 1.  Weakness, balance deficits with posterior lean, as well as SOB/tachycardia with activity secondary to left ICA occlusion.  -patient may shower  -ELOS/Goals: 7-10 days/supervision  Admit to CIR 2.  H/o DVT/A fib/Antithrombotics: -DVT/anticoagulation:  Pharmaceutical: Xarelto  -antiplatelet therapy: N/A 3. Pain Management: N/A 4. Mood: LCSW to follow for evaluation and support.   -antipsychotic agents: N/A 5. Neuropsych: This patient is not fully capable of making decisions on her own behalf. 6. Skin/Wound Care: Routine pressure relief measures.  7. Fluids/Electrolytes/Nutrition: Monitor I/Os  CMP ordered for tomorrow a.m. 8. Afib with RVR: Monitor HR TID--continue cardizem, amiodarone and  Xarelto.  --Monitor heart rate with increased exertion. Has had malaise/poor intake X weeks.   --recheck EKG in am to monitor QTc with resumption of Amiodarone. --Has pending eval with EP for PPM. SSS?   9. HTN: Monitor  BP TID--continues to have intermittent hypotension with SBP in 90's  Orthostatics ordered 10. CAP: Continue ceftriaxone day# 3/5?Marland Kitchen Will consult pharm to help determine days of tx.  11. Hyperlipidemia: Zetia due to intolerance of statins.  12.  CKD stage II  --GFR 59 on 9/21, CMP ordered for tomorrow 13. Acute on chronic systolic CHF: Compensated--will modify diet to heart healthy.  --monitor for signs of overload and check daily weights.  14. Hypocalcemia: Will add tums for supplement. 15. H/o esophageal stricture/GERD: Off PPI due to prolonged QTC--will replace with pepcid to prevent symptoms --will add reflux precautions.       Bary Leriche, PA-C 11/04/2020  I have personally performed a face to face diagnostic evaluation, including, but not limited to relevant history and physical exam findings, of this patient and developed relevant assessment and plan.  Additionally, I have reviewed and concur with the physician assistant's documentation above.  Delice Lesch, MD, ABPMR

## 2020-11-05 DIAGNOSIS — Z7901 Long term (current) use of anticoagulants: Secondary | ICD-10-CM

## 2020-11-05 DIAGNOSIS — I5022 Chronic systolic (congestive) heart failure: Secondary | ICD-10-CM

## 2020-11-05 DIAGNOSIS — E876 Hypokalemia: Secondary | ICD-10-CM

## 2020-11-05 DIAGNOSIS — I639 Cerebral infarction, unspecified: Secondary | ICD-10-CM

## 2020-11-05 LAB — COMPREHENSIVE METABOLIC PANEL
ALT: 13 U/L (ref 0–44)
AST: 16 U/L (ref 15–41)
Albumin: 2.3 g/dL — ABNORMAL LOW (ref 3.5–5.0)
Alkaline Phosphatase: 75 U/L (ref 38–126)
Anion gap: 6 (ref 5–15)
BUN: 9 mg/dL (ref 8–23)
CO2: 26 mmol/L (ref 22–32)
Calcium: 8.4 mg/dL — ABNORMAL LOW (ref 8.9–10.3)
Chloride: 106 mmol/L (ref 98–111)
Creatinine, Ser: 0.9 mg/dL (ref 0.44–1.00)
GFR, Estimated: >60 mL/min (ref >60)
Glucose, Bld: 92 mg/dL (ref 70–99)
Potassium: 3.8 mmol/L (ref 3.5–5.1)
Sodium: 138 mmol/L (ref 135–145)
Total Bilirubin: 0.7 mg/dL (ref 0.3–1.2)
Total Protein: 5.5 g/dL — ABNORMAL LOW (ref 6.5–8.1)

## 2020-11-05 LAB — CBC WITH DIFFERENTIAL/PLATELET
Abs Immature Granulocytes: 0.04 10*3/uL (ref 0.00–0.07)
Basophils Absolute: 0.1 10*3/uL (ref 0.0–0.1)
Basophils Relative: 1 %
Eosinophils Absolute: 0.1 10*3/uL (ref 0.0–0.5)
Eosinophils Relative: 2 %
HCT: 28.6 % — ABNORMAL LOW (ref 36.0–46.0)
Hemoglobin: 9 g/dL — ABNORMAL LOW (ref 12.0–15.0)
Immature Granulocytes: 1 %
Lymphocytes Relative: 15 %
Lymphs Abs: 1.1 10*3/uL (ref 0.7–4.0)
MCH: 28 pg (ref 26.0–34.0)
MCHC: 31.5 g/dL (ref 30.0–36.0)
MCV: 88.8 fL (ref 80.0–100.0)
Monocytes Absolute: 0.7 10*3/uL (ref 0.1–1.0)
Monocytes Relative: 9 %
Neutro Abs: 5.4 10*3/uL (ref 1.7–7.7)
Neutrophils Relative %: 72 %
Platelets: 328 10*3/uL (ref 150–400)
RBC: 3.22 MIL/uL — ABNORMAL LOW (ref 3.87–5.11)
RDW: 17.6 % — ABNORMAL HIGH (ref 11.5–15.5)
WBC: 7.4 10*3/uL (ref 4.0–10.5)
nRBC: 0 % (ref 0.0–0.2)

## 2020-11-05 LAB — VITAMIN A: Vitamin A (Retinoic Acid): 14.7 ug/dL — ABNORMAL LOW (ref 22.0–69.5)

## 2020-11-05 LAB — MAGNESIUM: Magnesium: 2.2 mg/dL (ref 1.7–2.4)

## 2020-11-05 LAB — TSH: TSH: 2.451 u[IU]/mL (ref 0.350–4.500)

## 2020-11-05 LAB — VITAMIN B1: Vitamin B1 (Thiamine): 119.1 nmol/L (ref 66.5–200.0)

## 2020-11-05 MED ORDER — METOPROLOL TARTRATE 50 MG PO TABS
50.0000 mg | ORAL_TABLET | Freq: Two times a day (BID) | ORAL | Status: DC
  Administered 2020-11-05 – 2020-11-07 (×4): 50 mg via ORAL
  Filled 2020-11-05 (×4): qty 1

## 2020-11-05 MED ORDER — POTASSIUM CHLORIDE 20 MEQ PO PACK
20.0000 meq | PACK | Freq: Two times a day (BID) | ORAL | Status: DC
  Administered 2020-11-05 – 2020-11-08 (×6): 20 meq via ORAL
  Filled 2020-11-05 (×6): qty 1

## 2020-11-05 MED ORDER — AMIODARONE HCL 200 MG PO TABS
100.0000 mg | ORAL_TABLET | Freq: Every day | ORAL | Status: DC
  Administered 2020-11-06 – 2020-11-15 (×10): 100 mg via ORAL
  Filled 2020-11-05 (×10): qty 1

## 2020-11-05 NOTE — H&P (Addendum)
Late entry:     Physical Medicine and Rehabilitation Admission H&P    CC: Functional deficits due to stroke.    HPI: Frances Kent. Frances Kent is an 87 year old female with history of HTN, anxiety, PE/CAF- on Xarelto (Dr. [name]), SVT, MVR, esophageal stricture s/p dilatation 10/21, Covid 1/22 w/ chronic cough,  malaise X weeks who was admitted to [facility] on 10/30/2020 with fatigue, worsening of SOB and palpitations.  History taken from patient, daughter, and chart review due to cognition.  She was treated for fluid overload, started on IV antibiotics due to concerns of left lower lobe pneumonia and Cardizem for rate control. On 11/01/2020, she has near syncope with hypotension, EKG with prolonged QTC-588-stable and later that day developed right sided hemiparesis with aphasia, decrease in LOC with right hemianopsia. CTA showed left ICA occlusion into proximal M1 with distal reconstitution and was transferred to [facility] for cerebral angio with thrombectomy of L-ICA and L-MCA with TICI 3 by Dr. Earleen Newport.  Echocardiogram with ejection fraction of 40-45%, left atrial enlargement and moderate to severe mitral valve regurgitation. Follow up MRI/MRA brain showed tiny acute infarcts in left basal ganglia and patent L-MCA.   Amiodarone on hold due to QT prolongation and Cardizem resumed PCCM recommended 5 day course of ceftriaxone for treatment of CAP and to follow temp curve. Cleared for regular diet per bedside swallow. Stroke felt to be embolic due to atrial fibrillation.  Patient has been compliant with Xarelto but taking on empty stomach-->she was advised to take with dinner for better absorption.  Repeat EKG with QTC-492 and amiodarone resumed on 09/20 due to ongoing tachycardia. Her verbal output has improved with mild dysarthria and mild residual RUE hemiparesis. Therapy ongoing and patient limited by weakness, balance deficits with posterior lean as well as SOB/tachycardia with activity. CIR recommended due to  functional decline.  Please see preadmission assessment from earlier today as well.   Review of Systems  Constitutional:  Positive for malaise/fatigue and weight loss. Negative for chills and fever.  HENT:  Negative for hearing loss and tinnitus.   Eyes:  Negative for blurred vision and double vision.       Vision stable with ongoing injections for  macular degeneration.   Respiratory:  Positive for cough. Negative for sputum production and shortness of breath.   Cardiovascular:  Negative for chest pain, palpitations and leg swelling.  Gastrointestinal:  Positive for heartburn. Negative for abdominal pain and constipation.  Musculoskeletal:  Negative for back pain, joint pain and myalgias.  Neurological:  Positive for weakness. Negative for dizziness, speech change, focal weakness and headaches.  All other systems reviewed and are negative.   Past Medical History:  Diagnosis Date   Atrial fibrillation ([place])    Benign breast cyst in female, left 10/07/2016   Colon adenomas    GERD (gastroesophageal reflux disease)    Hypertension    Hypothyroidism    Macular degeneration    Mitral regurgitation    Pulmonary embolism ([place]) 02/2016   SVT (supraventricular tachycardia) ([place])     Past Surgical History:  Procedure Laterality Date   APPENDECTOMY  1960   BREAST CYST ASPIRATION Left 2017   CARDIOVERSION N/A 04/05/2016   Procedure: Cardioversion;  Surgeon: [name], MD;  Location: [facility] ORS;  Service: Cardiovascular;  Laterality: N/A;   CHOLECYSTECTOMY  1985   ESOPHAGOGASTRODUODENOSCOPY (EGD) WITH PROPOFOL N/A 11/27/2019   Procedure: ESOPHAGOGASTRODUODENOSCOPY (EGD) WITH PROPOFOL;  Surgeon: Lesly Rubenstein, MD;  Location: [facility] ENDOSCOPY;  Service:  Endoscopy;  Laterality: N/A;   ESOPHAGOGASTRODUODENOSCOPY (EGD) WITH PROPOFOL N/A 12/24/2019   Procedure: ESOPHAGOGASTRODUODENOSCOPY (EGD) WITH PROPOFOL;  Surgeon: Lesly Rubenstein, MD;  Location: [facility] ENDOSCOPY;  Service:  Endoscopy;  Laterality: N/A;   IR CT HEAD LTD  11/01/2020   IR PERCUTANEOUS ART THROMBECTOMY/INFUSION INTRACRANIAL INC DIAG ANGIO  11/01/2020   IR US GUIDE VASC ACCESS RIGHT  11/02/2020   OVARIAN CYST REMOVAL     RADIOLOGY WITH ANESTHESIA N/A 11/01/2020   Procedure: RADIOLOGY WITH ANESTHESIA;  Surgeon: Luanne Bras, MD;  Location: [place];  Service: Radiology;  Laterality: N/A;   TEE WITHOUT CARDIOVERSION N/A 02/01/2018   Procedure: TRANSESOPHAGEAL ECHOCARDIOGRAM (TEE);  Surgeon: [name], MD;  Location: [facility] ORS;  Service: Cardiovascular;  Laterality: N/A;    Family History  Problem Relation Age of Onset   Cancer Mother 72       breast cancer, lived to 69,    Breast cancer Mother 101   Cancer Son 37       pancreatic cancer   Heart disease Son        CAD, Tobacco Abuse    Heart disease Maternal Grandmother 80       died of massive MI   Stroke Sister     Social History: Has great granddaughter w/kids who stays with her. Daughters bring in meals and check on her daily. She has been homebound for past few weeks/months due to malaise. She reports that she has never smoked. She has never used smokeless tobacco. She reports that she does not drink alcohol and does not use drugs.   Allergies  Allergen Reactions   Statins     Severe myalgias    Medications Prior to Admission  Medication Sig Dispense Refill   acetaminophen (TYLENOL) 500 MG tablet Take 500 mg by mouth every 6 (six) hours as needed.     ALPRAZolam (XANAX) 0.25 MG tablet Take 1 tablet (0.25 mg total) by mouth at bedtime as needed for anxiety or sleep. 20 tablet 3   amiodarone (PACERONE) 200 MG tablet Take 200 mg by mouth daily.     benzonatate (TESSALON) 200 MG capsule Take 1 capsule (200 mg total) by mouth 3 (three) times daily as needed for cough. 60 capsule 1   cefTRIAXone 2 g in sodium chloride 0.9 % 100 mL Inject 2 g into the vein daily for 2 days.     chlorpheniramine-HYDROcodone (TUSSIONEX) 10-8 MG/5ML  SUER Take 5 mLs by mouth every 12 (twelve) hours as needed for cough. 140 mL 0   diltiazem (CARDIZEM) 30 MG tablet Take 1 tablet (30 mg total) by mouth every 8 (eight) hours. (Patient taking differently: Take 30 mg by mouth 2 (two) times daily.) 90 tablet 0   esomeprazole (NEXIUM) 40 MG capsule Take 1 capsule (40 mg total) by mouth daily. 30 capsule 3   ipratropium (ATROVENT) 0.02 % nebulizer solution Take 2.5 mLs (0.5 mg total) by nebulization every 4 (four) hours as needed for wheezing or shortness of breath (cough). 75 mL 0   levothyroxine (EUTHYROX) 50 MCG tablet TAKE 1 TABLET BY MOUTH ONCE DAILY 30  MINUTES  PRIOR  TO  EATING; 90 tablet 0   potassium chloride SA (KLOR-CON) 20 MEQ tablet Take 20 mEq by mouth daily.     XARELTO 15 MG TABS tablet TAKE 1 TABLET EVERY DAY 60 tablet 1    Drug Regimen Review  Drug regimen was reviewed and remains appropriate with no significant issues identified  Home:  Home Living Family/patient expects to be discharged to:: Private residence Living Arrangements:  (grand daughter and her 2 great grandchildren) Available Help at Discharge: Family, Available 24 hours/day Type of Home: House Home Access: Level entry Home Layout: Laundry or work area in basement, Able to live on main level with bedroom/bathroom Bathroom Shower/Tub: Multimedia programmer: Standard Bathroom Accessibility: No Home Equipment: Environmental consultant - 4 wheels, Shower seat, Grab bars - tub/shower Additional Comments: Reports not using rollator, does use grab bars and shower chair for safety.  Lives With: Family   Functional History: Prior Function Level of Independence: Independent Gait / Transfers Assistance Needed: reports ambulating short distances only. ADL's / Homemaking Assistance Needed: Able to perform all I/ADLs. Has rarely driven lately in past 1-2 weeks d/t fatigue, but is able to drive at baseline. Family assists with laundry as it is in the basement  Functional Status:   Mobility: Bed Mobility Overal bed mobility: Needs Assistance Bed Mobility: Supine to Sit Sidelying to sit: Min assist Supine to sit: Supervision Sit to supine: Mod assist (Lift BLE back onto bed) General bed mobility comments: supervision for safety only +HOB elevated and incrased time Transfers Overall transfer level: Needs assistance Equipment used: None Transfers: Sit to/from Stand Sit to Stand: Min guard Stand pivot transfers: Mod assist General transfer comment: demonstrated great hand placement for all sit<>stands this session. min guard for safety only. No AD this session to challenge balance; recommend pt use RW to increase safety without therapy Ambulation/Gait Ambulation/Gait assistance: Min assist Gait Distance (Feet): 50 Feet Assistive device: Rolling walker (2 wheeled) Gait Pattern/deviations: Step-through pattern, Decreased stride length, Trunk flexed, Narrow base of support General Gait Details: minA for walker management, standing rest break 1/2 way through due to "I'm SOB and my legs are tired." HR 100-139bpm, noted pt in afib - RN aware, pt slow pace, fatigues quickly Gait velocity: decr Gait velocity interpretation: <1.31 ft/sec, indicative of household ambulator    ADL: ADL Overall ADL's : Needs assistance/impaired Eating/Feeding: Minimal assistance Grooming: Min guard, Standing, Wash/dry face, Wash/dry hands, Oral care, Brushing hair Grooming Details (indicate cue type and reason): using BUE WFL for all grooming tasks at the sink; no physical assist require. Min guard for safety Upper Body Bathing: Moderate assistance Lower Body Bathing: Moderate assistance Upper Body Dressing : Moderate assistance Lower Body Dressing: Maximal assistance Toilet Transfer: Minimal assistance, Ambulation Toilet Transfer Details (indicate cue type and reason): simulated by walkign in room with sit<>stand from recliner. min A for 1 mild LOB. noted decreased step length with  RLE Toileting- Clothing Manipulation and Hygiene: Maximal assistance Functional mobility during ADLs: Minimal assistance, Cueing for safety General ADL Comments: min A for functional mobility within the room without AD for 1 mild LOB. Pt attempting to reach for external support  Cognition: Cognition Overall Cognitive Status: Impaired/Different from baseline Arousal/Alertness: Awake/alert Orientation Level: Oriented X4 Year: 2022 Day of Week: Correct Memory: Impaired Memory Impairment: Storage deficit, Retrieval deficit Awareness: Impaired Awareness Impairment: Anticipatory impairment, Emergent impairment Problem Solving:  (diagnostic treatment) Safety/Judgment: Impaired Cognition Arousal/Alertness: Awake/alert Behavior During Therapy: WFL for tasks assessed/performed Overall Cognitive Status: Impaired/Different from baseline Area of Impairment: Attention, Following commands, Memory, Safety/judgement, Awareness, Problem solving Current Attention Level: Selective Memory: Decreased short-term memory Following Commands: Follows multi-step commands consistently Safety/Judgement: Decreased awareness of safety, Decreased awareness of deficits Awareness: Emergent Problem Solving: Slow processing General Comments: Per chart notes, pt seemingly with improved cognition this session. She was able to complete 4 step trail making  task with incrased time for processing. Pt also demonstrates good safety awareness reporting she needs someone with her anytime she gets up. pt's daughter verbalized a noted improvement in cognitive tasks and alertness   Blood pressure 117/70, pulse 77, temperature 98.7 F (37.1 C), temperature source Oral, resp. rate 16, weight 44.5 kg, SpO2 97 %. Physical Exam Vitals reviewed.  Constitutional:      Appearance: She is underweight.     Comments: Frail  HENT:     Head: Normocephalic and atraumatic.     Right Ear: External ear normal.     Left Ear: External ear  normal.     Nose: Nose normal.  Eyes:     General:        Right eye: No discharge.        Left eye: No discharge.     Extraocular Movements: Extraocular movements intact.  Cardiovascular:     Comments: Irregularly irregular Pulmonary:     Effort: Pulmonary effort is normal. No respiratory distress.     Breath sounds: No stridor. No rhonchi.  Abdominal:     General: Abdomen is flat. Bowel sounds are normal. There is no distension.  Musculoskeletal:     Cervical back: Normal range of motion and neck supple.     Comments: No edema or tenderness in extremities  Skin:    General: Skin is warm and dry.  Neurological:     Mental Status: She is alert.     Comments: Alert and oriented x2 Motor: RUE/RLE: 4+/5 proximal distal LUE/LE: 4/5 proximal to distal  Psychiatric:        Mood and Affect: Affect is flat.        Speech: Speech is delayed.        Behavior: Behavior is slowed.    Results for orders placed or performed during the hospital encounter of 11/01/20 (from the past 48 hour(s))  Glucose, capillary     Status: None   Collection Time: 11/02/20 12:32 PM  Result Value Ref Range   Glucose-Capillary 91 70 - 99 mg/dL    Comment: Glucose reference range applies only to samples taken after fasting for at least 8 hours.  Zinc     Status: None   Collection Time: 11/02/20  3:51 PM  Result Value Ref Range   Zinc 66 44 - 115 ug/dL    Comment: (NOTE) This test was developed and its performance characteristics determined by [facility]. It has not been cleared or approved by the Food and Drug Administration.                                Detection Limit = 5 Performed At: [facility] [address] [place], [place] [id number] Rush Farmer MD YB:[id number]   Copper, serum     Status: None   Collection Time: 11/02/20  3:51 PM  Result Value Ref Range   Copper 156 80 - 158 ug/dL    Comment: (NOTE) This test was developed and its performance characteristics determined by  [facility]. It has not been cleared or approved by the Food and Drug Administration.                                Detection Limit = 5 Performed At: [facility] [address] [place], [place] [id number] Rush Farmer MD [id number]   Glucose, capillary  Status: None   Collection Time: 11/02/20  4:07 PM  Result Value Ref Range   Glucose-Capillary 80 70 - 99 mg/dL    Comment: Glucose reference range applies only to samples taken after fasting for at least 8 hours.  Glucose, capillary     Status: Abnormal   Collection Time: 11/02/20  7:50 PM  Result Value Ref Range   Glucose-Capillary 125 (H) 70 - 99 mg/dL    Comment: Glucose reference range applies only to samples taken after fasting for at least 8 hours.  Glucose, capillary     Status: Abnormal   Collection Time: 11/02/20 11:28 PM  Result Value Ref Range   Glucose-Capillary 101 (H) 70 - 99 mg/dL    Comment: Glucose reference range applies only to samples taken after fasting for at least 8 hours.  CBC     Status: Abnormal   Collection Time: 11/03/20  1:18 AM  Result Value Ref Range   WBC 6.7 4.0 - 10.5 K/uL   RBC 3.29 (L) 3.87 - 5.11 MIL/uL   Hemoglobin 9.2 (L) 12.0 - 15.0 g/dL   HCT 29.5 (L) 36.0 - 46.0 %   MCV 89.7 80.0 - 100.0 fL   MCH 28.0 26.0 - 34.0 pg   MCHC 31.2 30.0 - 36.0 g/dL   RDW 18.0 (H) 11.5 - 15.5 %   Platelets 332 150 - 400 K/uL   nRBC 0.0 0.0 - 0.2 %    Comment: Performed at [facility] [address], [place], [place] [address]  Basic metabolic panel     Status: Abnormal   Collection Time: 11/03/20  1:18 AM  Result Value Ref Range   Sodium 139 135 - 145 mmol/L   Potassium 3.6 3.5 - 5.1 mmol/L   Chloride 106 98 - 111 mmol/L   CO2 26 22 - 32 mmol/L   Glucose, Bld 86 70 - 99 mg/dL    Comment: Glucose reference range applies only to samples taken after fasting for at least 8 hours.   BUN 12 8 - 23 mg/dL   Creatinine, Ser 0.94 0.44 - 1.00 mg/dL   Calcium 8.1 (L) 8.9 - 10.3 mg/dL   GFR,  Estimated 58 (L) >60 mL/min    Comment: (NOTE) Calculated using the CKD-EPI Creatinine Equation (2021)    Anion gap 7 5 - 15    Comment: Performed at [facility] [address], [place], [place] [address]  Glucose, capillary     Status: Abnormal   Collection Time: 11/03/20  3:28 AM  Result Value Ref Range   Glucose-Capillary 104 (H) 70 - 99 mg/dL    Comment: Glucose reference range applies only to samples taken after fasting for at least 8 hours.  Glucose, capillary     Status: Abnormal   Collection Time: 11/03/20  7:53 AM  Result Value Ref Range   Glucose-Capillary 116 (H) 70 - 99 mg/dL    Comment: Glucose reference range applies only to samples taken after fasting for at least 8 hours.  Glucose, capillary     Status: Abnormal   Collection Time: 11/03/20 12:00 PM  Result Value Ref Range   Glucose-Capillary 109 (H) 70 - 99 mg/dL    Comment: Glucose reference range applies only to samples taken after fasting for at least 8 hours.  Glucose, capillary     Status: Abnormal   Collection Time: 11/03/20  5:13 PM  Result Value Ref Range   Glucose-Capillary 115 (H) 70 - 99 mg/dL  Comment: Glucose reference range applies only to samples taken after fasting for at least 8 hours.  Glucose, capillary     Status: Abnormal   Collection Time: 11/03/20  7:53 PM  Result Value Ref Range   Glucose-Capillary 127 (H) 70 - 99 mg/dL    Comment: Glucose reference range applies only to samples taken after fasting for at least 8 hours.  Glucose, capillary     Status: Abnormal   Collection Time: 11/04/20 12:39 AM  Result Value Ref Range   Glucose-Capillary 100 (H) 70 - 99 mg/dL    Comment: Glucose reference range applies only to samples taken after fasting for at least 8 hours.  CBC     Status: Abnormal   Collection Time: 11/04/20  3:25 AM  Result Value Ref Range   WBC 8.0 4.0 - 10.5 K/uL   RBC 3.27 (L) 3.87 - 5.11 MIL/uL   Hemoglobin 9.0 (L) 12.0 - 15.0 g/dL   HCT 29.4 (L) 36.0 - 46.0 %    MCV 89.9 80.0 - 100.0 fL   MCH 27.5 26.0 - 34.0 pg   MCHC 30.6 30.0 - 36.0 g/dL   RDW 17.7 (H) 11.5 - 15.5 %   Platelets 331 150 - 400 K/uL   nRBC 0.0 0.0 - 0.2 %    Comment: Performed at [facility] [address], [place], [place] [address]  Basic metabolic panel     Status: Abnormal   Collection Time: 11/04/20  3:25 AM  Result Value Ref Range   Sodium 140 135 - 145 mmol/L   Potassium 4.4 3.5 - 5.1 mmol/L   Chloride 107 98 - 111 mmol/L   CO2 27 22 - 32 mmol/L   Glucose, Bld 99 70 - 99 mg/dL    Comment: Glucose reference range applies only to samples taken after fasting for at least 8 hours.   BUN 9 8 - 23 mg/dL   Creatinine, Ser 0.93 0.44 - 1.00 mg/dL   Calcium 8.4 (L) 8.9 - 10.3 mg/dL   GFR, Estimated 59 (L) >60 mL/min    Comment: (NOTE) Calculated using the CKD-EPI Creatinine Equation (2021)    Anion gap 6 5 - 15    Comment: Performed at [facility] [address], [place], [place] [address]  Glucose, capillary     Status: Abnormal   Collection Time: 11/04/20  6:27 AM  Result Value Ref Range   Glucose-Capillary 105 (H) 70 - 99 mg/dL    Comment: Glucose reference range applies only to samples taken after fasting for at least 8 hours.   No results found.     Medical Problem List and Plan: 1.  Weakness, balance deficits with posterior lean, as well as SOB/tachycardia with activity secondary to left ICA occlusion.  -patient may shower  -ELOS/Goals: 7-10 days/supervision  Admit to CIR 2.  H/o DVT/A fib/Antithrombotics: -DVT/anticoagulation:  Pharmaceutical: Xarelto  -antiplatelet therapy: N/A 3. Pain Management: N/A 4. Mood: LCSW to follow for evaluation and support.   -antipsychotic agents: N/A 5. Neuropsych: This patient is not fully capable of making decisions on her own behalf. 6. Skin/Wound Care: Routine pressure relief measures.  7. Fluids/Electrolytes/Nutrition: Monitor I/Os  CMP ordered for tomorrow a.m. 8. Afib with RVR: Monitor HR  TID--continue cardizem, amiodarone and Xarelto.  --Monitor heart rate with increased exertion. Has had malaise/poor intake X weeks.   --recheck EKG in am to monitor QTc with resumption of Amiodarone. --Has pending eval with EP for PPM. SSS?  9. HTN: Monitor BP TID--continues to have intermittent hypotension with SBP in 90's  Orthostatics ordered 10. CAP: Continue ceftriaxone day# 3/5?Marland Kitchen Will consult pharm to help determine days of tx.  11. Hyperlipidemia: Zetia due to intolerance of statins.  12.  CKD stage II  --GFR 59 on 9/21, CMP ordered for tomorrow 13. Acute on chronic systolic CHF: Compensated--will modify diet to heart healthy.  --monitor for signs of overload and check daily weights.  14. Hypocalcemia: Will add tums for supplement. 15. H/o esophageal stricture/GERD: Off PPI due to prolonged QTC--will replace with pepcid to prevent symptoms --will add reflux precautions.       Bary Leriche, PA-C 11/04/2020  I have personally performed a face to face diagnostic evaluation, including, but not limited to relevant history and physical exam findings, of this patient and developed relevant assessment and plan.  Additionally, I have reviewed and concur with the physician assistant's documentation above.  Delice Lesch, MD, ABPMR  The patient's status has not changed. Any changes from the pre-admission screening or documentation from the acute chart are noted above.   Delice Lesch, MD, ABPMR

## 2020-11-05 NOTE — Progress Notes (Signed)
Occupational Therapy Assessment and Plan  Patient Details  Name: Frances Kent MRN: [id number] Date of Birth: [birth year]  OT Diagnosis: cognitive deficits, hemiplegia affecting non-dominant side, and muscle weakness (generalized) Rehab Potential: Rehab Potential (ACUTE ONLY): Good ELOS: 7-10 days   Today's Date: 11/05/2020 OT Individual Time: 8270-7867 OT Individual Time Calculation (min): 60 min     Hospital Problem: Active Problems:   Left basal ganglia embolic stroke [facility])   Past Medical History:  Past Medical History:  Diagnosis Date   Atrial fibrillation ([place])    Benign breast cyst in female, left 10/07/2016   Colon adenomas    GERD (gastroesophageal reflux disease)    Hypertension    Hypothyroidism    Macular degeneration    Mitral regurgitation    Pulmonary embolism ([place]) 02/2016   SVT (supraventricular tachycardia) ([facility])    Past Surgical History:  Past Surgical History:  Procedure Laterality Date   APPENDECTOMY  1960   BREAST CYST ASPIRATION Left 2017   CARDIOVERSION N/A 04/05/2016   Procedure: Cardioversion;  Surgeon: [name], MD;  Location: [facility] ORS;  Service: Cardiovascular;  Laterality: N/A;   CHOLECYSTECTOMY  1985   ESOPHAGOGASTRODUODENOSCOPY (EGD) WITH PROPOFOL N/A 11/27/2019   Procedure: ESOPHAGOGASTRODUODENOSCOPY (EGD) WITH PROPOFOL;  Surgeon: Lesly Rubenstein, MD;  Location: [facility] ENDOSCOPY;  Service: Endoscopy;  Laterality: N/A;   ESOPHAGOGASTRODUODENOSCOPY (EGD) WITH PROPOFOL N/A 12/24/2019   Procedure: ESOPHAGOGASTRODUODENOSCOPY (EGD) WITH PROPOFOL;  Surgeon: Lesly Rubenstein, MD;  Location: [facility] ENDOSCOPY;  Service: Endoscopy;  Laterality: N/A;   IR CT HEAD LTD  11/01/2020   IR PERCUTANEOUS ART THROMBECTOMY/INFUSION INTRACRANIAL INC DIAG ANGIO  11/01/2020   IR US GUIDE VASC ACCESS RIGHT  11/02/2020   OVARIAN CYST REMOVAL     RADIOLOGY WITH ANESTHESIA N/A 11/01/2020   Procedure: RADIOLOGY WITH ANESTHESIA;  Surgeon: Luanne Bras, MD;   Location: [place];  Service: Radiology;  Laterality: N/A;   TEE WITHOUT CARDIOVERSION N/A 02/01/2018   Procedure: TRANSESOPHAGEAL ECHOCARDIOGRAM (TEE);  Surgeon: [name], MD;  Location: [facility] ORS;  Service: Cardiovascular;  Laterality: N/A;    Assessment & Plan Clinical Impression: Frances Kent. Frances Kent is an 87 year old female with history of HTN, anxiety, PE/CAF- on Xarelto (Dr. [name]), SVT, MVR, esophageal stricture s/p dilatation 10/21, Covid 1/22 w/ chronic cough,  malaise X weeks who was admitted to [facility] on 10/30/2020 with fatigue, worsening of SOB and palpitations.  History taken from patient, daughter, and chart review due to cognition.  She was treated for fluid overload, started on IV antibiotics due to concerns of left lower lobe pneumonia and Cardizem for rate control. On 11/01/2020, she has near syncope with hypotension, EKG with prolonged QTC-588-stable and later that day developed right sided hemiparesis with aphasia, decrease in LOC with right hemianopsia. CTA showed left ICA occlusion into proximal M1 with distal reconstitution and was transferred to [facility] for cerebral angio with thrombectomy of L-ICA and L-MCA with TICI 3 by Dr. Earleen Newport.  Echocardiogram with ejection fraction of 40-45%, left atrial enlargement and moderate to severe mitral valve regurgitation. Follow up MRI/MRA brain showed tiny acute infarcts in left basal ganglia and patent L-MCA.    Amiodarone on hold due to QT prolongation and Cardizem resumed PCCM recommended 5 day course of ceftriaxone for treatment of CAP and to follow temp curve. Cleared for regular diet per bedside swallow. Stroke felt to be embolic due to atrial fibrillation.  Patient has been compliant with Xarelto but taking on empty stomach-->she was advised to  take with dinner for better absorption.  Repeat EKG with QTC-492 and amiodarone resumed on 09/20 due to ongoing tachycardia. Her verbal output has improved with mild dysarthria and mild residual RUE  hemiparesis. Therapy ongoing and patient limited by weakness, balance deficits with posterior lean as well as SOB/tachycardia with activity. CIR recommended due to functional decline.  Please see preadmission assessment from earlier today as well.  Patient currently requires min with basic self-care skills and IADL secondary to muscle weakness, impaired timing and sequencing, unbalanced muscle activation, and decreased coordination, decreased visual acuity and macular degeneration (baseline), and decreased initiation, decreased attention, decreased awareness, decreased problem solving, decreased safety awareness, decreased memory, and delayed processing.  Prior to hospitalization, patient could complete ADLs with independence.  Patient will benefit from skilled intervention to decrease level of assist with basic self-care skills, increase independence with basic self-care skills, and increase level of independence with iADL prior to discharge home with care partner.  Anticipate patient will require 24 hour supervision and follow up outpatient.  OT - End of Session Activity Tolerance: Tolerates 10 - 20 min activity with multiple rests Endurance Deficit: Yes Endurance Deficit Description: Quick to fatigue, poor activity tolerance. OT Assessment Rehab Potential (ACUTE ONLY): Good OT Patient demonstrates impairments in the following area(s): Balance;Cognition;Endurance;Motor;Safety;Vision OT Basic ADL's Functional Problem(s): Grooming;Bathing;Dressing;Toileting OT Transfers Functional Problem(s): Toilet;Tub/Shower OT Additional Impairment(s): Fuctional Use of Upper Extremity OT Plan OT Intensity: Minimum of 1-2 x/day, 45 to 90 minutes OT Duration/Estimated Length of Stay: 7-10 days OT Treatment/Interventions: Balance/vestibular training;Cognitive remediation/compensation;Community reintegration;Discharge planning;DME/adaptive equipment instruction;Functional mobility training;Neuromuscular  re-education;Patient/family education;Self Care/advanced ADL retraining;Therapeutic Activities;Therapeutic Exercise;UE/LE Strength taining/ROM;UE/LE Coordination activities OT Self Feeding Anticipated Outcome(s): I OT Basic Self-Care Anticipated Outcome(s): Mod I to S OT Toileting Anticipated Outcome(s): S OT Bathroom Transfers Anticipated Outcome(s): S OT Recommendation Patient destination: Home Follow Up Recommendations: Outpatient OT Equipment Recommended: To be determined   OT Evaluation Precautions/Restrictions  Precautions Precautions: Fall Precaution Comments: Mcular degeneration Restrictions Weight Bearing Restrictions: No Other Position/Activity Restrictions: Monitor BP General Chart Reviewed: Yes Additional Pertinent History: PMHx significant for HTN, Afib, PE, and GERD. Family/Caregiver Present: No Vital Signs Therapy Vitals Patient Position (if appropriate): Orthostatic Vitals Oxygen Therapy SpO2: 92 % O2 Device: Room Air Pain Pain Assessment Pain Scale: 0-10 Pain Score: 0-No pain Home Living/Prior Functioning Home Living Available Help at Discharge: Family, Available 24 hours/day Type of Home: House Home Access: Level entry Home Layout: Laundry or work area in basement, Able to live on main level with bedroom/bathroom Bathroom Shower/Tub: Multimedia programmer: Standard Bathroom Accessibility: No Additional Comments: Reports not using rollator in community dwellings only.  Lives With: Family IADL History Current License: Yes Mode of Transportation: Car (Did not drive x2 weeks PTA) Education: 9th grade Prior Function Level of Independence: Independent with basic ADLs, Independent with transfers, Needs assistance with homemaking Driving: Yes Vocation: Retired Comments: Independent until she began feeling week several weeks ago. Vision Baseline Vision/History: 1 Wears glasses (Readers) Ability to See in Adequate Light: 1 Impaired Patient  Visual Report: Blurring of vision Vision Assessment?:  (Macular degeneration at baseline) Ocular Range of Motion: Within Functional Limits Alignment/Gaze Preference: Within Defined Limits Visual Fields: No apparent deficits Additional Comments: Reports no change in vision other than slight blurriness. Perception  Perception: Impaired Comments: Mild R inattention Praxis Praxis: Intact Cognition Arousal/Alertness: Awake/alert Orientation Level: Person;Place;Situation Person: Oriented Place: Oriented Situation: Oriented Year: 2022 Month: September Day of Week: Correct Memory: Impaired Memory Impairment: Storage deficit;Retrieval deficit Immediate Memory Recall:  Sock;Blue;Bed Memory Recall Sock: With Cue Memory Recall Blue: Not able to recall Memory Recall Bed: Not able to recall Awareness: Impaired Awareness Impairment: Anticipatory impairment;Emergent impairment Safety/Judgment: Impaired Comments: Decreased knowledge of current deficits. Sensation Sensation Light Touch: Appears Intact Hot/Cold: Appears Intact Proprioception: Impaired by gross assessment Coordination Gross Motor Movements are Fluid and Coordinated: Yes Fine Motor Movements are Fluid and Coordinated: No Finger Nose Finger Test: Decreased on R. Motor  Motor Motor: Hemiplegia Motor - Skilled Clinical Observations: Mild R hemi  Trunk/Postural Assessment  Cervical Assessment Cervical Assessment: Exceptions to [facility] Thoracic Assessment Thoracic Assessment: Exceptions to [facility] Lumbar Assessment Lumbar Assessment: Exceptions to [facility] (Anterior pelvic tilt) Postural Control Postural Control: Deficits on evaluation  Balance Balance Balance Assessed: Yes Static Sitting Balance Static Sitting - Balance Support: No upper extremity supported;Feet supported Static Sitting - Level of Assistance: 5: Stand by assistance Static Sitting - Comment/# of Minutes: Able to maintain static sitting balance at EOB for >10 min  without LOB. Dynamic Sitting Balance Dynamic Sitting - Balance Support: Feet supported Dynamic Sitting - Level of Assistance: 5: Stand by assistance Dynamic Sitting Balance - Compensations: No LOB with reaching outside of BOS in sitting. Dynamic Sitting - Balance Activities: Lateral lean/weight shifting;Forward lean/weight shifting;Reaching for objects;Reaching across midline Static Standing Balance Static Standing - Balance Support: Right upper extremity supported;During functional activity Static Standing - Level of Assistance: 5: Stand by assistance Dynamic Standing Balance Dynamic Standing - Balance Support: Right upper extremity supported;During functional activity Dynamic Standing - Level of Assistance: 4: Min assist Dynamic Standing - Balance Activities: Other (comment) Dynamic Standing - Comments: Unilateral UE support with mobility. Extremity/Trunk Assessment RUE Assessment RUE Assessment: Exceptions to [facility] Passive Range of Motion (PROM) Comments: WFL RUE Body System: Neuro Brunstrum levels for arm and hand: Arm;Hand Brunstrum level for arm: Stage IV Movement is deviating from synergy Brunstrum level for hand: Stage IV Movements deviating from synergies RUE Strength RUE Overall Strength: Deficits Right Shoulder Flexion: 3+/5 Right Elbow Flexion: 3+/5 Right Elbow Extension: 3+/5 Right Wrist Flexion: 3+/5 Right Wrist Extension: 3+/5 Right Hand Gross Grasp: Functional LUE Assessment LUE Assessment: Exceptions to [facility] Passive Range of Motion (PROM) Comments: WFL Active Range of Motion (AROM) Comments: WFL General Strength Comments: 4-/5  Care Tool Care Tool Self Care Eating   Eating Assist Level: Set up assist    Oral Care    Oral Care Assist Level: Set up assist    Bathing   Body parts bathed by patient: Right arm;Left arm;Chest;Abdomen;Front perineal area;Right upper leg;Left upper leg;Face Body parts bathed by helper: Buttocks;Left lower leg;Right lower leg    Assist Level: Minimal Assistance - Patient > 75%    Upper Body Dressing(including orthotics)   What is the patient wearing?: Pull over shirt   Assist Level: Set up assist    Lower Body Dressing (excluding footwear)   What is the patient wearing?: Pants;Incontinence brief Assist for lower body dressing: Minimal Assistance - Patient > 75%    Putting on/Taking off footwear   What is the patient wearing?: Non-skid slipper socks Assist for footwear: Minimal Assistance - Patient > 75%       Care Tool Toileting Toileting activity   Assist for toileting: Minimal Assistance - Patient > 75%     Care Tool Bed Mobility Roll left and right activity   Roll left and right assist level: Supervision/Verbal cueing    Sit to lying activity   Sit to lying assist level: Contact Guard/Touching assist  Lying to sitting on side of bed activity   Lying to sitting on side of bed assist level: the ability to move from lying on the back to sitting on the side of the bed with no back support.: Contact Guard/Touching assist     Care Tool Transfers Sit to stand transfer   Sit to stand assist level: Contact Guard/Touching assist    Chair/bed transfer   Chair/bed transfer assist level: Contact Guard/Touching assist     Toilet transfer   Assist Level: Contact Guard/Touching assist     Care Tool Cognition  Expression of Ideas and Wants Expression of Ideas and Wants: 3. Some difficulty - exhibits some difficulty with expressing needs and ideas (e.g, some words or finishing thoughts) or speech is not clear  Understanding Verbal and Non-Verbal Content Understanding Verbal and Non-Verbal Content: 3. Usually understands - understands most conversations, but misses some part/intent of message. Requires cues at times to understand   Memory/Recall Ability Memory/Recall Ability : That he or she is in a hospital/hospital unit;Current season   Refer to Care Plan for Long Term Goals  SHORT TERM GOAL WEEK  1 OT Short Term Goal 1 (Week 1): STE=LTG 2/2 ELOS  Recommendations for other services: Other: TBD    Skilled Therapeutic Intervention Patient met lying supine in bed in agreement with OT treatment session. Daughter [name] present at bedside. OT discussed role, rehab expectations, ELOS and goals. Patient/family expressed verbal understanding. Patient reports fatigue secondary to inability to get much sleep last night. Patient initially requiring increased coaxing/encouragement to participate. Toileting on standard height commode in bathroom and bathing/dressing at shower level. Grooming seated at EOB. Please refer below for necessary level of assist. Patient with initial L drift with mobility without AD and HHA. Limited by mild R-hemiparesis, decreased coordination, decreased cognition, generalized debility, decreased activity tolerance and decreased balance. Patient would benefit from continued OT services in prep for safe return to PLOF.   ADL ADL Eating: Set up Where Assessed-Eating: Edge of bed Grooming: Setup Where Assessed-Grooming: Edge of bed Upper Body Bathing: Setup Where Assessed-Upper Body Bathing: Shower Lower Body Bathing: Minimal assistance Where Assessed-Lower Body Bathing: Shower Upper Body Dressing: Setup Where Assessed-Upper Body Dressing: Edge of bed Lower Body Dressing: Minimal assistance Where Assessed-Lower Body Dressing: Edge of bed Toileting: Contact guard Where Assessed-Toileting: Glass blower/designer: Therapist, music Method: Other (comment) (Ambulating) Science writer: Energy manager: Curator Method: Other (comment) (Ambulating) Walk-In Engineer, site: Grab bars ADL Comments: Bathing seated on BSC in shower Mobility  Bed Mobility Bed Mobility: Rolling Right;Rolling Left;Supine to Sit;Sit to Supine Rolling Right: Supervision/verbal cueing Rolling Left: Supervision/Verbal  cueing Supine to Sit: Contact Guard/Touching assist Sit to Supine: Contact Guard/Touching assist Transfers Sit to Stand: Contact Guard/Touching assist   Discharge Criteria: Patient will be discharged from OT if patient refuses treatment 3 consecutive times without medical reason, if treatment goals not met, if there is a change in medical status, if patient makes no progress towards goals or if patient is discharged from hospital.  The above assessment, treatment plan, treatment alternatives and goals were discussed and mutually agreed upon: by patient  Yaneth Fairbairn R Howerton-Davis 11/05/2020, 10:19 AM

## 2020-11-05 NOTE — Progress Notes (Signed)
[name] called concerned about her mother's breathing and transient non productive cough. V/S stable, Spo2 93-95% on RA, RR 20-21 but patient does not seem to be in respiratory distress. The daughter is asking why she is not on antibiotics anymore and has been refusing to have her given robitussin syrup since start of the shift. Escalated concerns to Charge Nurse and she gave her reassurance that there is no unusual findings upon assessment, and convinced her that PRN guaifenesin is going to help with her cough, and that her question about her antibiotics will be discussed with the Doctor in the morning rounds.  Daughter agrees to have cough med given. All other needs attended.

## 2020-11-05 NOTE — Consult Note (Signed)
CARDIOLOGY CONSULT NOTE  Patient ID: Frances Kent MRN: [id number] DOB/AGE: [birth year] 87 y.o.  Admit date: 11/04/2020 Attending physician: Izora Ribas, MD Primary Physician:  [name], MD Outpatient Cardiologist: Dr. Serafina Royals Inpatient Cardiologist: Rex Kras, DO, [facility]  Reason of consultation: Afib and Prolong QT  Referring physician: Izora Ribas, MD  Chief complaint: Inpatient rehab status post stroke  HPI:  Frances Kent is a 87 y.o. Caucasian female who presents with a chief complaint of " inpatient rehab status post stroke." Her past medical history and cardiovascular risk factors include: Hypertension, anxiety, atrial fibrillation, mitral regurgitation, esophageal strictures status post dilatation 11/2019, COVID 02/2020 with chronic cough, recent stroke left ICA occlusion into proximal M1 distribution status post cerebral angiography with thrombectomy of the left ICA and left MCA, advanced age, postmenopausal female.  Her primary cardiologist is Dr. Serafina Royals and gets majority of her care at [facility] and was recently seen by Dr. Saralyn Pilar on 11/01/2020.  She initially presented to [facility] on 10/30/2020 with symptoms of fatigue, worsening shortness of breath, and palpitations.  She was noted to have fluid overload and concerning for left lower lobe pneumonia.  She was started on IV antibiotics and parenteral Cardizem drip for rate control.  Based on electronic medical records on 11/01/2020 she had a near syncopal event and later that day developed right-sided hemiparesis with aphasia along with decreased loss of consciousness and right hemianopsia.  CTA noted left ICA occlusion into the proximal M1 branch.  Patient underwent cerebral angiography and thrombectomy of the left ICA and left MCA with Dr. Earleen Newport.  Echocardiogram noted mildly reduced LVEF at 40-45%, moderate to severe MR.  And follow-up MRI/MRI notes tiny acute infarcts in the left basal  ganglia.  Patient was transferred to inpatient rehab.  Cardiology is now consulted during this encounter to weigh in on atrial fibrillation management and prolonged QT on EKG.  Clinically patient is resting in bed comfortably.  Denies any chest pain at rest or with effort related activities.  She does have shortness of breath at rest and mostly with activities which is chronic and stable.  She denies orthopnea, paroxysmal nocturnal dyspnea or lower extremity swelling.  Patient was noted to have a prolonged QTC and azithromycin which was started for her left lower lobe pneumonia was discontinued her other medications have also been reviewed to prevent administration of drugs that may prolong her QT.  However, given her A. fib she is on amiodarone and Cardizem.  Review of her prior EKGs note intermittent prolonged QT intervals as well (not a new finding).   ALLERGIES: Allergies  Allergen Reactions   Statins     Severe myalgias    PAST MEDICAL HISTORY: Past Medical History:  Diagnosis Date   Atrial fibrillation ([place])    Benign breast cyst in female, left 10/07/2016   Colon adenomas    GERD (gastroesophageal reflux disease)    Hypertension    Hypothyroidism    Macular degeneration    Mitral regurgitation    Pulmonary embolism ([place]) 02/2016   SVT (supraventricular tachycardia) ([place])     PAST SURGICAL HISTORY: Past Surgical History:  Procedure Laterality Date   APPENDECTOMY  1960   BREAST CYST ASPIRATION Left 2017   CARDIOVERSION N/A 04/05/2016   Procedure: Cardioversion;  Surgeon: [name], MD;  Location: [facility] ORS;  Service: Cardiovascular;  Laterality: N/A;   CHOLECYSTECTOMY  1985   ESOPHAGOGASTRODUODENOSCOPY (EGD) WITH PROPOFOL N/A 11/27/2019   Procedure: ESOPHAGOGASTRODUODENOSCOPY (EGD) WITH  PROPOFOL;  Surgeon: Lesly Rubenstein, MD;  Location: [facility] ENDOSCOPY;  Service: Endoscopy;  Laterality: N/A;   ESOPHAGOGASTRODUODENOSCOPY (EGD) WITH PROPOFOL N/A 12/24/2019    Procedure: ESOPHAGOGASTRODUODENOSCOPY (EGD) WITH PROPOFOL;  Surgeon: Lesly Rubenstein, MD;  Location: [facility] ENDOSCOPY;  Service: Endoscopy;  Laterality: N/A;   IR CT HEAD LTD  11/01/2020   IR PERCUTANEOUS ART THROMBECTOMY/INFUSION INTRACRANIAL INC DIAG ANGIO  11/01/2020   IR US GUIDE VASC ACCESS RIGHT  11/02/2020   OVARIAN CYST REMOVAL     RADIOLOGY WITH ANESTHESIA N/A 11/01/2020   Procedure: RADIOLOGY WITH ANESTHESIA;  Surgeon: Luanne Bras, MD;  Location: [facility];  Service: Radiology;  Laterality: N/A;   TEE WITHOUT CARDIOVERSION N/A 02/01/2018   Procedure: TRANSESOPHAGEAL ECHOCARDIOGRAM (TEE);  Surgeon: [name], MD;  Location: [facility] ORS;  Service: Cardiovascular;  Laterality: N/A;    FAMILY HISTORY: The patient family history includes Breast cancer (age of onset: 36) in her mother; Cancer (age of onset: 35) in her son; Cancer (age of onset: 99) in her mother; Heart disease in her son; Heart disease (age of onset: 69) in her maternal grandmother; Stroke in her sister.   SOCIAL HISTORY:  The patient  reports that she has never smoked. She has never used smokeless tobacco. She reports that she does not drink alcohol and does not use drugs.  MEDICATIONS: Current Outpatient Medications  Medication Instructions   acetaminophen (TYLENOL) 500 mg, Oral, Every 6 hours PRN   amiodarone (PACERONE) 200 mg, Oral, Daily   diltiazem (CARDIZEM) 60 mg, Oral, Every 6 hours   docusate sodium (COLACE) 100 mg, Oral, 2 times daily PRN   ezetimibe (ZETIA) 10 mg, Oral, Daily   insulin aspart (NOVOLOG) 0-6 Units, Subcutaneous, 3 times daily with meals & bedtime   ipratropium (ATROVENT) 0.5 mg, Nebulization, Every 6 hours PRN   levothyroxine (EUTHYROX) 50 mcg, Oral, Daily   polyethylene glycol (MIRALAX / GLYCOLAX) 17 g, Oral, Daily PRN   Rivaroxaban (XARELTO) 15 mg, Oral, Daily with supper    REVIEW OF SYSTEMS: Review of Systems  Constitutional: Positive for malaise/fatigue. Negative for chills  and fever.  HENT:  Negative for hoarse voice and nosebleeds.   Eyes:  Negative for discharge, double vision and pain.  Cardiovascular:  Positive for dyspnea on exertion (chronic and stable.). Negative for chest pain, claudication, leg swelling, near-syncope, orthopnea, palpitations, paroxysmal nocturnal dyspnea and syncope.  Respiratory:  Positive for shortness of breath (chronic and stable.). Negative for hemoptysis.   Musculoskeletal:  Negative for muscle cramps and myalgias.  Gastrointestinal:  Negative for abdominal pain, constipation, diarrhea, hematemesis, hematochezia, melena, nausea and vomiting.  Neurological:  Positive for weakness. Negative for dizziness and light-headedness.  All other systems reviewed and are negative.  PHYSICAL EXAM: Vitals with BMI 11/05/2020 11/05/2020 11/05/2020  Height - - -  Weight - - -  BMI - - -  Systolic 371 696 789  Diastolic 55 67 60  Pulse 84 72 83     Intake/Output Summary (Last 24 hours) at 11/05/2020 1733 Last data filed at 11/05/2020 1311 Gross per 24 hour  Intake 260 ml  Output --  Net 260 ml    Net IO Since Admission: 260 mL [11/05/20 1733]  CONSTITUTIONAL: Appears older than stated age, hemodynamically stable.  No acute distress.  SKIN: Skin is warm and dry. No rash noted. No cyanosis. No pallor. No jaundice HEAD: Normocephalic and atraumatic.  EYES: No scleral icterus MOUTH/THROAT: Moist oral membranes.  NECK: No JVD present. No thyromegaly noted.  No carotid bruits  LYMPHATIC: No visible cervical adenopathy.  CHEST Normal respiratory effort. No intercostal retractions  LUNGS: Clear to auscultation bilaterally.  No stridor. No wheezes. No rales.  CARDIOVASCULAR: Irregularly irregular, variable V7-C5, soft holosystolic murmur heard at apex, no gallops or rubs ABDOMINAL: Nonobese, soft, nontender, nondistended, positive bowel sounds in all 4 quadrants no apparent ascites.  EXTREMITIES: No peripheral edema  HEMATOLOGIC: No  significant bruising NEUROLOGIC: Oriented to person, place, and time.  Left upper and lower extremities weaker than right side. PSYCHIATRIC: Flat affect, speech delayed, slowed behavior. Cooperative  RADIOLOGY: No results found.  LABORATORY DATA: Lab Results  Component Value Date   WBC 7.4 11/05/2020   HGB 9.0 (L) 11/05/2020   HCT 28.6 (L) 11/05/2020   MCV 88.8 11/05/2020   PLT 328 11/05/2020    Recent Labs  Lab 11/05/20 0519  NA 138  K 3.8  CL 106  CO2 26  BUN 9  CREATININE 0.90  CALCIUM 8.4*  PROT 5.5*  BILITOT 0.7  ALKPHOS 75  ALT 13  AST 16  GLUCOSE 92    Lipid Panel  Lab Results  Component Value Date   CHOL 144 11/02/2020   HDL 48 11/02/2020   LDLCALC 81 11/02/2020   LDLDIRECT 185.3 03/27/2013   TRIG 76 11/02/2020   TRIG 72 11/02/2020   CHOLHDL 3.0 11/02/2020    BNP (last 3 results) Recent Labs    10/15/20 1437 10/30/20 0939 11/01/20 0532  BNP 954.5* 1,073.7* 418.5*    HEMOGLOBIN A1C Lab Results  Component Value Date   HGBA1C 5.8 (H) 11/02/2020   MPG 119.76 11/02/2020    Cardiac Panel (last 3 results) No results for input(s): CKTOTAL, CKMB, TROPONINIHS, RELINDX in the last 72 hours.   TSH Recent Labs    10/15/20 2236 10/16/20 0659 10/30/20 0939  TSH 1.326 0.880 2.300     CARDIAC DATABASE: EKG: 11/05/2020: Atrial fibrillation, 79 bpm, right bundle branch block, left anterior fascicular block, prolonged QT interval.  No significant change compared to prior ECG  Echocardiogram: 11/01/2020: LVEF 40-45%, indeterminate diastolic pattern, normal right ventricular size and function, left atrium moderately dilated, right atrium moderately dilated, moderate to severe MR, moderate TR, suggestive of RAP 3 mmHg  IMPRESSION & RECOMMENDATIONS: JACQUES WILLINGHAM is a 87 y.o. Caucasian female whose past medical history and cardiovascular risk factors include: Hypertension, anxiety, atrial fibrillation, mitral regurgitation, esophageal strictures  status post dilatation 11/2019, COVID 02/2020 with chronic cough, recent stroke left ICA occlusion into proximal M1 distribution status post cerebral angiography with thrombectomy of the left ICA and left MCA, advanced age, postmenopausal female.  Persistent atrial fibrillation: Ventricular rate controlled. Rate control: Transition Cardizem to Lopressor Rhythm control: Amiodarone Thromboembolic prophylaxis: Xarelto CHA2DS2-VASc SCORE is 7 which correlates to 9.6% risk of stroke per year (reduced LVEF, hypertension, age greater than 24, history of stroke, gender) Review of electronic medical record notes that patient is currently being treated as rate control strategy with attempt to rhythm control. EMR notes prior history of cardioversion. Patient denies any prior history of atrial fibrillation ablation. Given her mildly reduced LVEF we will transition her from Cardizem to Lopressor and titrate as needed. Given her prolonged QT will decrease amiodarone to 100 mg p.o. daily She will need to be monitored closely. EKG in the morning  Prolonged QT: Patient has known history of prolonged QT. Avoiding medications that may further progress her QT interval. Medications reconciled Agree with discontinuation of azithromycin, Compazine. Will decrease amiodarone to 100 mg  p.o. daily Check TSH and Mg level Keep potassium at 4 and magnesium at 2  Long-term oral anticoagulation: Indication: Atrial fibrillation Currently on Xarelto Estimated Creatinine Clearance: 30.8 mL/min (by C-G formula based on SCr of 0.9 mg/dL). Patient does not endorse any obvious bleeding. Also reviewed with the patient the risks, benefits, and alternatives of oral anticoagulation.  Hypokalemia: Potassium chloride packets 20 mEq p.o. twice daily  Chronic heart failure with reduced EF, stage C, NYHA class II/III: Medications reconciled. Will uptitrate medical therapy as hemodynamics and laboratory values allow.  Continue  your care given her other chronic comorbid conditions.  Total encounter time 88 minutes. *Total Encounter Time as defined by the Centers for Medicare and Medicaid Services includes, in addition to the face-to-face time of a patient visit (documented in the note above) non-face-to-face time: obtaining and reviewing outside history, ordering and reviewing medications, tests or procedures, care coordination (communications with other health care professionals or caregivers) and documentation in the medical record.  Patient's questions and concerns were addressed to her satisfaction. She voices understanding of the instructions provided during this encounter.   This note was created using a voice recognition software as a result there may be grammatical errors inadvertently enclosed that do not reflect the nature of this encounter. Every attempt is made to correct such errors.  Post discharge would recommend longitudinal follow-up with her primary cardiologist Dr. [name].  Mechele Claude [facility]  Pager: [phone] Office: [phone] 11/05/2020, 5:33 PM

## 2020-11-05 NOTE — Progress Notes (Signed)
Patient ID: Frances Kent, female   DOB: [birth year], 87 y.o.   MRN: [id number] Met with the patient's family as she was sleeping after therapy. Reviewed role and educational notebook with handouts on dietary modifications and risk management for discharge. Protein and calcium level are low; encouraged increasing foods. Continue to follow along to discharge to address educational needs and collaborate with the team to facilitate preparation for discharge. Margarito Liner

## 2020-11-05 NOTE — Progress Notes (Signed)
PROGRESS NOTE   Subjective/Complaints: C/o fatigue and insomnia, itching at IV site. Reviewed labs with her- stable except for low protein- encouraged high protein foods- can d/c IV and do not need repeat labs Denies pain  ROS: +insomnia  Objective:   No results found. Recent Labs    11/04/20 0325 11/05/20 0519  WBC 8.0 7.4  HGB 9.0* 9.0*  HCT 29.4* 28.6*  PLT 331 328   Recent Labs    11/04/20 0325 11/05/20 0519  NA 140 138  K 4.4 3.8  CL 107 106  CO2 27 26  GLUCOSE 99 92  BUN 9 9  CREATININE 0.93 0.90  CALCIUM 8.4* 8.4*    Intake/Output Summary (Last 24 hours) at 11/05/2020 1216 Last data filed at 11/05/2020 0845 Gross per 24 hour  Intake 60 ml  Output --  Net 60 ml        Physical Exam: Vital Signs Blood pressure (!) 126/55, pulse 84, temperature 98.6 F (37 C), resp. rate 19, height 5\' 3"  (1.6 m), weight 45.1 kg, SpO2 92 %. Gen: no distress, normal appearing HEENT: oral mucosa pink and moist, NCAT Cardiovascular:     Comments: Irregularly irregular Pulmonary:     Effort: Pulmonary effort is normal. No respiratory distress.     Breath sounds: No stridor. No rhonchi.  Abdominal:     General: Abdomen is flat. Bowel sounds are normal. There is no distension.  Musculoskeletal:     Cervical back: Normal range of motion and neck supple.     Comments: No edema or tenderness in extremities  Skin:    General: Skin is warm and dry. IV in place Neurological:     Mental Status: She is alert.     Comments: Alert and oriented x2 Motor: RUE/RLE: 4+/5 proximal distal LUE/LE: 4/5 proximal to distal  Psychiatric:        Mood and Affect: Affect is flat.        Speech: Speech is delayed.        Behavior: Behavior is slowed.    Assessment/Plan: 1. Functional deficits which require 3+ hours per day of interdisciplinary therapy in a comprehensive inpatient rehab setting. Physiatrist is providing close team  supervision and 24 hour management of active medical problems listed below. Physiatrist and rehab team continue to assess barriers to discharge/monitor patient progress toward functional and medical goals  Care Tool:  Bathing    Body parts bathed by patient: Right arm, Left arm, Chest, Abdomen, Front perineal area, Right upper leg, Left upper leg, Face   Body parts bathed by helper: Buttocks, Left lower leg, Right lower leg     Bathing assist Assist Level: Minimal Assistance - Patient > 75%     Upper Body Dressing/Undressing Upper body dressing   What is the patient wearing?: Pull over shirt    Upper body assist Assist Level: Set up assist    Lower Body Dressing/Undressing Lower body dressing      What is the patient wearing?: Pants, Incontinence brief     Lower body assist Assist for lower body dressing: Minimal Assistance - Patient > 75%     Toileting Toileting    Toileting assist  Assist for toileting: Minimal Assistance - Patient > 75%     Transfers Chair/bed transfer  Transfers assist     Chair/bed transfer assist level: Contact Guard/Touching assist     Locomotion Ambulation   Ambulation assist              Walk 10 feet activity   Assist           Walk 50 feet activity   Assist           Walk 150 feet activity   Assist           Walk 10 feet on uneven surface  activity   Assist           Wheelchair     Assist               Wheelchair 50 feet with 2 turns activity    Assist            Wheelchair 150 feet activity     Assist          Blood pressure (!) 126/55, pulse 84, temperature 98.6 F (37 C), resp. rate 19, height 5\' 3"  (1.6 m), weight 45.1 kg, SpO2 92 %.    Medical Problem List and Plan: 1.  Weakness, balance deficits with posterior lean, as well as SOB/tachycardia with activity secondary to left ICA occlusion.             -patient may shower             -ELOS/Goals: 7-10  days/supervision             Initial CIR evaluations today.  2.  H/o DVT/A fib/Antithrombotics: -DVT/anticoagulation:  Pharmaceutical: Xarelto             -antiplatelet therapy: N/A 3. Pain Management: N/A 4. Mood: LCSW to follow for evaluation and support.              -antipsychotic agents: N/A 5. Neuropsych: This patient is not fully capable of making decisions on her own behalf. 6. Skin/Wound Care: Routine pressure relief measures.  7. Fluids/Electrolytes/Nutrition: Monitor I/Os             CMP ordered for tomorrow a.m. 8. Afib with RVR: Monitor HR TID--continue cardizem, amiodarone and Xarelto.             --Monitor heart rate with increased exertion. Has had malaise/poor intake X weeks.              --EKG with prolonged Qtc- will consult cardiology --Has pending eval with EP for PPM. SSS?   9. HTN: Monitor BP TID--continues to have intermittent hypotension with SBP in 90's             Orthostatics ordered 10. CAP: Completed course of ceftriaxone, discussed with patient and daughter.  11. Hyperlipidemia: Zetia due to intolerance of statins.  12.  AKI: resolved.  13. Acute on chronic systolic CHF: Compensated--will modify diet to heart healthy.  --monitor for signs of overload and check daily weights.  14. Hypocalcemia: Will add tums for supplement. 15. H/o esophageal stricture/GERD: Off PPI due to prolonged QTC--will replace with pepcid to prevent symptoms --continue reflux precautions.    16. Insomnia: will discuss with cardiology if cardizem frequency can be changed so that she is not woken at midnight for dose.     LOS: 1 days A FACE TO FACE EVALUATION WAS PERFORMED  Clide Deutscher Moriya Mitchell 11/05/2020, 12:16 PM

## 2020-11-05 NOTE — Evaluation (Signed)
Speech Language Pathology Assessment and Plan  Patient Details  Name: Frances Kent MRN: [id number] Date of Birth: 01/23/1931  SLP Diagnosis: Dysarthria;Cognitive Impairments;Dysphagia  Rehab Potential: Good ELOS: 5-7 days    Today's Date: 11/05/2020 SLP Individual Time: 1100-1200 SLP Individual Time Calculation (min): 60 min   Hospital Problem: Active Problems:   Left basal ganglia embolic stroke ([place])   Long term (current) use of anticoagulants   Hypokalemia   Chronic HFrEF (heart failure with reduced ejection fraction) ([place])  Past Medical History:  Past Medical History:  Diagnosis Date   Atrial fibrillation ([place])    Benign breast cyst in female, left 10/07/2016   Colon adenomas    GERD (gastroesophageal reflux disease)    Hypertension    Hypothyroidism    Macular degeneration    Mitral regurgitation    Pulmonary embolism ([place]) 02/2016   SVT (supraventricular tachycardia) ([facility])    Past Surgical History:  Past Surgical History:  Procedure Laterality Date   APPENDECTOMY  1960   BREAST CYST ASPIRATION Left 2017   CARDIOVERSION N/A 04/05/2016   Procedure: Cardioversion;  Surgeon: [name], MD;  Location: [facility];  Service: Cardiovascular;  Laterality: N/A;   CHOLECYSTECTOMY  1985   ESOPHAGOGASTRODUODENOSCOPY (EGD) WITH PROPOFOL N/A 11/27/2019   Procedure: ESOPHAGOGASTRODUODENOSCOPY (EGD) WITH PROPOFOL;  Surgeon: Lesly Rubenstein, MD;  Location: [facility];  Service: Endoscopy;  Laterality: N/A;   ESOPHAGOGASTRODUODENOSCOPY (EGD) WITH PROPOFOL N/A 12/24/2019   Procedure: ESOPHAGOGASTRODUODENOSCOPY (EGD) WITH PROPOFOL;  Surgeon: Lesly Rubenstein, MD;  Location: [facility] END[facility];  Service: Endoscopy;  Laterality: N/A;   IR CT HEAD LTD  11/01/2020   IR PERCUTANEOUS ART THROMBECTOMY/INFUSION INTRACRANIAL INC DIAG ANGIO  11/01/2020   IR US GUIDE VASC ACCESS RIGHT  11/02/2020   OVARIAN CYST REMOVAL     RADIOLOGY WITH ANESTHESIA N/A 11/01/2020   Procedure:  RADIOLOGY WITH ANESTHESIA;  Surgeon: Luanne Bras, MD;  Location: [place];  Service: Radiology;  Laterality: N/A;   TEE WITHOUT CARDIOVERSION N/A 02/01/2018   Procedure: TRANSESOPHAGEAL ECHOCARDIOGRAM (TEE);  Surgeon: [name], MD;  Location: [facility];  Service: Cardiovascular;  Laterality: N/A;    Assessment / Plan / Recommendation  Patient is an 87 year old female with history of HTN, anxiety, PE/CAF- on Xarelto (Dr. [name]), SVT, MVR, esophageal stricture s/p dilatation 10/21, Covid 1/22 w/ chronic cough,  malaise X weeks who was admitted to [facility] on 10/30/2020 with fatigue, worsening of SOB and palpitations.  History taken from patient, daughter, and chart review due to cognition.  She was treated for fluid overload, started on IV antibiotics due to concerns of left lower lobe pneumonia and Cardizem for rate control. On 11/01/2020, she has near syncope with hypotension, EKG with prolonged QTC-588-stable and later that day developed right sided hemiparesis with aphasia, decrease in LOC with right hemianopsia. CTA showed left ICA occlusion into proximal M1 with distal reconstitution and was transferred to [facility] for cerebral angio with thrombectomy of L-ICA and L-MCA with TICI 3 by Dr. Earleen Newport.  Echocardiogram with ejection fraction of 40-45%, left atrial enlargement and moderate to severe mitral valve regurgitation. Follow up MRI/MRA brain showed tiny acute infarcts in left basal ganglia and patent L-MCA.   Cleared for regular diet per bedside swallow. Stroke felt to be embolic due to atrial fibrillation. Her verbal output has improved with mild dysarthria and mild residual RUE hemiparesis. Therapy ongoing and patient limited by weakness, balance deficits with posterior lean as well as SOB/tachycardia with activity. CIR recommended  due to functional decline.  Patient transferred to CIR on 11/04/2020 .    Clinical Impression Patient presents with a mild cognitive-linguistic and speech impairment  but with oropharyngeal swallow function appearing to be [facility] with baseline esophageal dysphagia. Patient was slightly disoriented to time but oriented to place, situation. Initially, she exhibited mild-moderate dysarthria, however as session progressed, this then was very mild and intelligiblity was close to 100%. Patient with mildly hoarse vocal quality and although voice is low in intensity, can be easily heard and understood when face to face and in close proximity to patient. She exhibited mild memory retrieval impairment and mild impairment in verbal complex problem solving and reasoning. She demonstrated good confrontational naming of all common objects and majority of less common object pictures but she did struggle with divergent naming. She exhibited intermittent perseveration on previous word or topic said, requiring verbal cues to redirect. Patient reports she does manage  her medications and finances but that family is available to help with that. SLP is recommending skilled ST during CIR stay to maximize cognitive-linguistic and speech function prior to discharge with goals of supervision level for mild complex cognition and language.  Skilled Therapeutic Interventions          BSE, SLE  SLP Assessment  Patient will need skilled Speech Lanaguage Pathology Services during CIR admission    Recommendations  SLP Diet Recommendations: Age appropriate regular solids;Thin Liquid Administration via: Cup;Straw Medication Administration: Other (Comment) (cut in half or crushed) Supervision: Patient able to self feed Compensations: Minimize environmental distractions;Slow rate;Small sips/bites Postural Changes and/or Swallow Maneuvers: Seated upright 90 degrees;Upright 30-60 min after meal Oral Care Recommendations: Oral care BID Patient destination: Home Follow up Recommendations: None Equipment Recommended: None recommended by SLP    SLP Frequency 3 to 5 out of 7 days   SLP Duration  SLP  Intensity  SLP Treatment/Interventions 5-7 days  Minumum of 1-2 x/day, 30 to 90 minutes  Cognitive remediation/compensation;Environmental controls;Speech/Language facilitation;Functional tasks;Internal/external aids;Patient/family education;Medication managment    Pain Pain Assessment Pain Scale: 0-10 Pain Score: 0-No pain  Prior Functioning Cognitive/Linguistic Baseline: Within functional limits Type of Home: House  Lives With: Family Available Help at Discharge: Family;Available 24 hours/day Education: 9th grade Vocation: Retired  Programmer, systems Overall Cognitive Status: Impaired/Different from baseline Arousal/Alertness: Awake/alert Orientation Level: Oriented to person;Oriented to place;Oriented to situation;Disoriented to time Year: 2022 Day of Week: Correct Attention: Sustained Sustained Attention: Appears intact Memory: Impaired Memory Impairment: Retrieval deficit;Other (comment) (recalled 1/4 words after 3 minute delay without cues and 3/4 with category cue) Awareness: Impaired Awareness Impairment: Emergent impairment;Anticipatory impairment Problem Solving: Impaired Problem Solving Impairment: Verbal complex Safety/Judgment: Impaired Comments: Decreased knowledge of current deficits.  Comprehension Auditory Comprehension Overall Auditory Comprehension: Appears within functional limits for tasks assessed Yes/No Questions: Within Functional Limits Commands: Within Functional Limits Conversation: Simple EffectiveTechniques: Extra processing time Visual Recognition/Discrimination Discrimination: Within Function Limits Reading Comprehension Reading Status: Not tested Expression Expression Primary Mode of Expression: Verbal Verbal Expression Overall Verbal Expression: Impaired Initiation: No impairment Level of Generative/Spontaneous Verbalization: Sentence Naming: Impairment Responsive: 76-100% accurate Confrontation: Within functional  limits Convergent: Not tested Divergent: 50-74% accurate Verbal Errors: Perseveration Pragmatics: No impairment Effective Techniques: Semantic cues Non-Verbal Means of Communication: Not applicable Written Expression Dominant Hand: Left Oral Motor Oral Motor/Sensory Function Overall Oral Motor/Sensory Function: Mild impairment Facial ROM: Within Functional Limits Facial Symmetry: Within Functional Limits Facial Strength: Within Functional Limits Facial Sensation: Within Functional Limits Lingual ROM: Within Functional Limits Lingual Symmetry:  Within Functional Limits Lingual Strength: Reduced Velum: Within Functional Limits Mandible: Within Functional Limits Motor Speech Overall Motor Speech: Impaired Respiration: Impaired Level of Impairment: Sentence Phonation: Hoarse Resonance: Within functional limits Articulation: Impaired Level of Impairment: Sentence Intelligibility: Intelligibility reduced Word: 75-100% accurate Phrase: 75-100% accurate Sentence: 75-100% accurate Conversation: 75-100% accurate Motor Planning: Witnin functional limits Motor Speech Errors: Not applicable Effective Techniques: Slow rate;Increased vocal intensity  Care Tool Care Tool Cognition Ability to hear (with hearing aid or hearing appliances if normally used Ability to hear (with hearing aid or hearing appliances if normally used): 0. Adequate - no difficulty in normal conservation, social interaction, listening to TV   Expression of Ideas and Wants Expression of Ideas and Wants: 3. Some difficulty - exhibits some difficulty with expressing needs and ideas (e.g, some words or finishing thoughts) or speech is not clear   Understanding Verbal and Non-Verbal Content Understanding Verbal and Non-Verbal Content: 3. Usually understands - understands most conversations, but misses some part/intent of message. Requires cues at times to understand  Memory/Recall Ability Memory/Recall Ability : Current  season;That he or she is in a hospital/hospital unit  Intelligibility: Intelligibility reduced Word: 75-100% accurate Phrase: 75-100% accurate Sentence: 75-100% accurate Conversation: 75-100% accurate  Bedside Swallowing Assessment General Date of Onset: 10/30/20 Previous Swallow Assessment: BSE 11/02/2020 Diet Prior to this Study: Regular;Thin liquids Temperature Spikes Noted: No Respiratory Status: Room air History of Recent Intubation: Yes Length of Intubations (days): 1 days Date extubated: 11/02/20 Behavior/Cognition: Alert;Cooperative;Pleasant mood Oral Cavity - Dentition: Dentures, top Self-Feeding Abilities: Able to feed self Vision: Functional for self-feeding Patient Positioning: Upright in bed Baseline Vocal Quality: Hoarse Volitional Cough: Strong Volitional Swallow: Able to elicit  Oral Care Assessment   Ice Chips   Thin Liquid Thin Liquid: Within functional limits Presentation: Straw;Self Fed Nectar Thick   Honey Thick   Puree Puree: Not tested Solid Solid: Not tested BSE Assessment Risk for Aspiration Impact on safety and function: No limitations;Mild aspiration risk Other Related Risk Factors: History of esophageal-related issues  Short Term Goals: Week 1: SLP Short Term Goal 1 (Week 1): Patient will demonstrate adequate awareness to errors and attempt self-correction when completing simulated mildly complex ADL tasks with supervision level A. SLP Short Term Goal 2 (Week 1): Patient will demonstrate ability to perform simulated medication management (pill box) organization and money management (check register) tasks with minA verbal and visual cues for accuracy. SLP Short Term Goal 3 (Week 1): Patient will maintain 100% speech intelligibility during conversations with SLP with intermittent verbal cues for speech strategies. (increase vocal intensity, overarticulate). SLP Short Term Goal 4 (Week 1): Patient will demonstrate adequate reasoning and  awareness during simulated functional problem solving tasks, with minA verbal and visual cues. SLP Short Term Goal 5 (Week 1): Patient will name at least 10 items in a given category with minA verbal, semantic cues.  Refer to Care Plan for Long Term Goals  Recommendations for other services: None   Discharge Criteria: Patient will be discharged from SLP if patient refuses treatment 3 consecutive times without medical reason, if treatment goals not met, if there is a change in medical status, if patient makes no progress towards goals or if patient is discharged from hospital.  The above assessment, treatment plan, treatment alternatives and goals were discussed and mutually agreed upon: by patient and by family  Sonia Baller, MA, CCC-SLP Speech Therapy

## 2020-11-05 NOTE — Progress Notes (Signed)
[place] [facility] Individual Statement of Services  Patient Name:  Frances Kent  Date:  11/05/2020  Welcome to the [facility].  Our goal is to provide you with an individualized program based on your diagnosis and situation, designed to meet your specific needs.  With this comprehensive rehabilitation program, you will be expected to participate in at least 3 hours of rehabilitation therapies Monday-Friday, with modified therapy programming on the weekends.  Your rehabilitation program will include the following services:  Physical Therapy (PT), Occupational Therapy (OT), Speech Therapy (ST), 24 hour per day rehabilitation nursing, Care Coordinator, Rehabilitation Medicine, Nutrition Services, and Pharmacy Services  Weekly team conferences will be held on Tuesday to discuss your progress.  Your Inpatient Rehabilitation Care Coordinator will talk with you frequently to get your input and to update you on team discussions.  Team conferences with you and your family in attendance may also be held.  Expected length of stay: 7-9 days  Overall anticipated outcome: supervision with cues  Depending on your progress and recovery, your program may change. Your Inpatient Rehabilitation Care Coordinator will coordinate services and will keep you informed of any changes. Your Inpatient Rehabilitation Care Coordinator's name and contact numbers are listed  below.  The following services may also be recommended but are not provided by the [facility]:   [place] will be made to provide these services after discharge if needed.  Arrangements include referral to agencies that provide these services.  Your insurance has been verified to be:  [employer] Your primary doctor is:  Deborra Medina  Pertinent information will be shared with your doctor and your insurance  company.  Inpatient Rehabilitation Care Coordinator:  Ovidio Kin, [place] or Emilia Beck  Information discussed with and copy given to patient by: [name], 11/05/2020, 12:29 PM

## 2020-11-05 NOTE — Evaluation (Signed)
Physical Therapy Assessment and Plan  Patient Details  Name: Frances Kent MRN: [id number] Date of Birth: 1930/07/24  PT Diagnosis: Abnormal posture, Abnormality of gait, Cognitive deficits, Difficulty walking, Hemiparesis non-dominant, and Muscle weakness Rehab Potential: Good ELOS: 5-7 days   Today's Date: 11/05/2020 PT Individual Time: 2952-8413 PT Individual Time Calculation (min): 75 min    Hospital Problem: Active Problems:   Left basal ganglia embolic stroke [facility])   Past Medical History:  Past Medical History:  Diagnosis Date   Atrial fibrillation ([place])    Benign breast cyst in female, left 10/07/2016   Colon adenomas    GERD (gastroesophageal reflux disease)    Hypertension    Hypothyroidism    Macular degeneration    Mitral regurgitation    Pulmonary embolism ([place]) 02/2016   SVT (supraventricular tachycardia) ([facility])    Past Surgical History:  Past Surgical History:  Procedure Laterality Date   APPENDECTOMY  1960   BREAST CYST ASPIRATION Left 2017   CARDIOVERSION N/A 04/05/2016   Procedure: Cardioversion;  Surgeon: [name], MD;  Location: [facility] ORS;  Service: Cardiovascular;  Laterality: N/A;   CHOLECYSTECTOMY  1985   ESOPHAGOGASTRODUODENOSCOPY (EGD) WITH PROPOFOL N/A 11/27/2019   Procedure: ESOPHAGOGASTRODUODENOSCOPY (EGD) WITH PROPOFOL;  Surgeon: Lesly Rubenstein, MD;  Location: [facility] END[facility];  Service: Endoscopy;  Laterality: N/A;   ESOPHAGOGASTRODUODENOSCOPY (EGD) WITH PROPOFOL N/A 12/24/2019   Procedure: ESOPHAGOGASTRODUODENOSCOPY (EGD) WITH PROPOFOL;  Surgeon: Lesly Rubenstein, MD;  Location: [facility] END[facility];  Service: Endoscopy;  Laterality: N/A;   IR CT HEAD LTD  11/01/2020   IR PERCUTANEOUS ART THROMBECTOMY/INFUSION INTRACRANIAL INC DIAG ANGIO  11/01/2020   IR US GUIDE VASC ACCESS RIGHT  11/02/2020   OVARIAN CYST REMOVAL     RADIOLOGY WITH ANESTHESIA N/A 11/01/2020   Procedure: RADIOLOGY WITH ANESTHESIA;  Surgeon: Luanne Bras, MD;   Location: [place];  Service: Radiology;  Laterality: N/A;   TEE WITHOUT CARDIOVERSION N/A 02/01/2018   Procedure: TRANSESOPHAGEAL ECHOCARDIOGRAM (TEE);  Surgeon: [name], MD;  Location: [facility] OR[facility];  Service: Cardiovascular;  Laterality: N/A;    Assessment & Plan Clinical Impression: Patient is an 87 year old female with history of HTN, anxiety, PE/CAF- on Xarelto (Dr. [name]), SVT, MVR, esophageal stricture s/p dilatation 10/21, Covid 1/22 w/ chronic cough,  malaise X weeks who was admitted to [facility] on 10/30/2020 with fatigue, worsening of SOB and palpitations.  History taken from patient, daughter, and chart review due to cognition.  She was treated for fluid overload, started on IV antibiotics due to concerns of left lower lobe pneumonia and Cardizem for rate control. On 11/01/2020, she has near syncope with hypotension, EKG with prolonged QTC-588-stable and later that day developed right sided hemiparesis with aphasia, decrease in LOC with right hemianopsia. CTA showed left ICA occlusion into proximal M1 with distal reconstitution and was transferred to [facility] for cerebral angio with thrombectomy of L-ICA and L-MCA with TICI 3 by Dr. Earleen Newport.  Echocardiogram with ejection fraction of 40-45%, left atrial enlargement and moderate to severe mitral valve regurgitation. Follow up MRI/MRA brain showed tiny acute infarcts in left basal ganglia and patent L-MCA.    Amiodarone on hold due to QT prolongation and Cardizem resumed PCCM recommended 5 day course of ceftriaxone for treatment of CAP and to follow temp curve. Cleared for regular diet per bedside swallow. Stroke felt to be embolic due to atrial fibrillation.  Patient has been compliant with Xarelto but taking on empty stomach-->she was advised to take with dinner  for better absorption.  Repeat EKG with QTC-492 and amiodarone resumed on 09/20 due to ongoing tachycardia. Her verbal output has improved with mild dysarthria and mild residual RUE hemiparesis.  Therapy ongoing and patient limited by weakness, balance deficits with posterior lean as well as SOB/tachycardia with activity. CIR recommended due to functional decline.  Patient transferred to CIR on 11/04/2020 .   Patient currently requires  CGA  with mobility secondary to muscle weakness, decreased cardiorespiratoy endurance, decreased problem solving, decreased safety awareness, and decreased memory, and decreased standing balance, hemiplegia, and decreased balance strategies.  Prior to hospitalization, patient was independent  with mobility and lived with Family in a House home.  Home access is level entry  Level entry.  Patient will benefit from skilled PT intervention to maximize safe functional mobility, minimize fall risk, and decrease caregiver burden for planned discharge home with 24 hour supervision.  Anticipate patient will benefit from follow up OP at discharge.  PT - End of Session Activity Tolerance: Tolerates < 10 min activity with changes in vital signs Endurance Deficit: Yes Endurance Deficit Description: Quick to fatigue, poor activity tolerance. PT Assessment Rehab Potential (ACUTE/IP ONLY): Good PT Barriers to Discharge: Insurance for SNF coverage;Other (comments) PT Barriers to Discharge Comments: Decreased activity tolerance and endurance PT Patient demonstrates impairments in the following area(s): Balance;Endurance;Motor;Safety PT Transfers Functional Problem(s): Bed Mobility;Bed to Chair;Car PT Locomotion Functional Problem(s): Ambulation;Stairs PT Plan PT Intensity: Minimum of 1-2 x/day ,45 to 90 minutes PT Frequency: 5 out of 7 days PT Duration Estimated Length of Stay: 5-7 days PT Treatment/Interventions: Ambulation/gait training;Balance/vestibular training;Cognitive remediation/compensation;Community reintegration;Discharge planning;Disease management/prevention;DME/adaptive equipment instruction;Functional mobility training;Neuromuscular re-education;Pain  management;Patient/family education;Psychosocial support;Skin care/wound management;Splinting/orthotics;Stair training;Therapeutic Activities;Therapeutic Exercise;UE/LE Strength taining/ROM;UE/LE Coordination activities;Visual/perceptual remediation/compensation;Wheelchair propulsion/positioning PT Transfers Anticipated Outcome(s): mod I PT Locomotion Anticipated Outcome(s): supervision PT Recommendation Follow Up Recommendations: Outpatient PT Patient destination: Home Equipment Recommended: To be determined Equipment Details: Pt owns no DME   PT Evaluation Precautions/Restrictions Precautions Precautions: Fall Precaution Comments: Macular degeneration Restrictions Weight Bearing Restrictions: No General Chart Reviewed: Yes Family/Caregiver Present: Yes - daughter and son Pain Pain Assessment Pain Scale: 0-10 Pain Score: 0-No pain Pain Interference Pain Interference Pain Effect on Sleep: 1. Rarely or not at all Pain Interference with Therapy Activities: 1. Rarely or not at all Pain Interference with Day-to-Day Activities: 1. Rarely or not at all Home Living/Prior [place]: Other relatives (great granddaughter and her two young children) Available Help at Discharge: Family;Available 24 hours/day Type of Home: House Home Access: Level entry Home Layout: Laundry or work area in basement;Able to live on main level with bedroom/bathroom Bathroom Shower/Tub: Walk-in shower  Lives With: Family Prior Function Level of Independence: Independent with basic ADLs;Independent with transfers;Needs assistance with homemaking  Able to Take Stairs?: No Driving: No Vocation: Retired Comments: Independent until she began feeling week several weeks ago. Vision/Perception  Perception Perception: Impaired Comments: some mild R inattention Praxis Praxis: Intact  Cognition Overall Cognitive Status: Impaired/Different from baseline Arousal/Alertness:  Awake/alert Orientation Level: Oriented X4 Year: 2022 Month: September Memory: Appears intact Problem Solving: Appears intact Safety/Judgment: Impaired Comments: Decreased knowledge of current deficits. Sensation Sensation Light Touch: Appears Intact Hot/Cold: Appears Intact Proprioception: Impaired by gross assessment Stereognosis: Not tested Coordination Gross Motor Movements are Fluid and Coordinated: Yes Fine Motor Movements are Fluid and Coordinated: No Heel Shin Test: WFL Motor  Motor Motor: Hemiplegia Motor - Skilled Clinical Observations: Mild R hemi   Trunk/Postural Assessment  Cervical Assessment Cervical Assessment: Within  Functional Limits Thoracic Assessment Thoracic Assessment: Exceptions to [facility] (rounded shoulders) Lumbar Assessment Lumbar Assessment: Within Functional Limits Postural Control Postural Control: Within Functional Limits  Balance Balance Balance Assessed: Yes Standardized Balance Assessment Standardized Balance Assessment: Berg Balance Test Berg Balance Test Sit to Stand: Able to stand without using hands and stabilize independently Standing Unsupported: Able to stand safely 2 minutes Sitting with Back Unsupported but Feet Supported on Floor or Stool: Able to sit safely and securely 2 minutes Stand to Sit: Sits safely with minimal use of hands Transfers: Able to transfer safely, minor use of hands Standing Unsupported with Eyes Closed: Able to stand 10 seconds safely Standing Ubsupported with Feet Together: Able to place feet together independently and stand 1 minute safely From Standing, Reach Forward with Outstretched Arm: Can reach forward >5 cm safely (2") From Standing Position, Pick up Object from Floor: Able to pick up shoe, needs supervision From Standing Position, Turn to Look Behind Over each Shoulder: Looks behind from both sides and weight shifts well Turn 360 Degrees: Able to turn 360 degrees safely but slowly Standing  Unsupported, Alternately Place Feet on Step/Stool: Able to complete >2 steps/needs minimal assist Standing Unsupported, One Foot in Front: Loses balance while stepping or standing Standing on One Leg: Tries to lift leg/unable to hold 3 seconds but remains standing independently Total Score: 41/56  Static Sitting Balance Static Sitting - Balance Support: Feet supported;No upper extremity supported Static Sitting - Level of Assistance: 7: Independent Static Sitting - Comment/# of Minutes: Able to maintain static sitting balance at EOB for >10 min without LOB. Dynamic Sitting Balance Dynamic Sitting - Balance Support: Feet supported;No upper extremity supported Dynamic Sitting - Level of Assistance: 5: Stand by assistance Dynamic Sitting Balance - Compensations: No LOB with reaching outside of BOS in sitting. Dynamic Sitting - Balance Activities: Lateral lean/weight shifting;Forward lean/weight shifting;Reaching for objects;Reaching across midline Static Standing Balance Static Standing - Balance Support: During functional activity Static Standing - Level of Assistance: 5: Stand by assistance Dynamic Standing Balance Dynamic Standing - Balance Support: During functional activity;No upper extremity supported Dynamic Standing - Level of Assistance: 4: Min assist Dynamic Standing - Balance Activities: Other (comment) Dynamic Standing - Comments: Unilateral UE support with mobility. Extremity Assessment  RLE Assessment RLE Assessment: Exceptions to [facility] General Strength Comments: Grossly 4-/5 LLE Assessment LLE Assessment: Exceptions to [facility] General Strength Comments: Grossly 4/5  Care Tool Care Tool Bed Mobility Roll left and right activity   Roll left and right assist level: Supervision/Verbal cueing    Sit to lying activity   Sit to lying assist level: Supervision/Verbal cueing    Lying to sitting on side of bed activity   Lying to sitting on side of bed assist level: the ability to  move from lying on the back to sitting on the side of the bed with no back support.: Supervision/Verbal cueing     Care Tool Transfers Sit to stand transfer   Sit to stand assist level: Contact Guard/Touching assist    Chair/bed transfer   Chair/bed transfer assist level: Contact Guard/Touching assist     Toilet transfer        Car transfer   Car transfer assist level: Contact Guard/Touching assist      Care Tool Locomotion Ambulation   Assist level: Contact Guard/Touching assist Assistive device: No Device Max distance: 187f  Walk 10 feet activity   Assist level: Contact Guard/Touching assist Assistive device: No Device   Walk 50 feet with  2 turns activity   Assist level: Contact Guard/Touching assist Assistive device: No Device  Walk 150 feet activity   Assist level: Contact Guard/Touching assist Assistive device: No Device  Walk 10 feet on uneven surfaces activity   Assist level: Contact Guard/Touching assist    Stairs   Assist level: Contact Guard/Touching assist Stairs assistive device: 2 hand rails Max number of stairs: 4  Walk up/down 1 step activity   Walk up/down 1 step (curb) assist level: Contact Guard/Touching assist Walk up/down 1 step or curb assistive device: 2 hand rails    Walk up/down 4 steps activity Walk up/down 4 steps assist level: Contact Guard/Touching assist Walk up/down 4 steps assistive device: 2 hand rails  Walk up/down 12 steps activity Walk up/down 12 steps activity did not occur: Safety/medical concerns (fatigue)      Pick up small objects from floor   Pick up small object from the floor assist level: Supervision/Verbal cueing    Wheelchair Is the patient using a wheelchair?: No          Wheel 50 feet with 2 turns activity      Wheel 150 feet activity        Refer to Care Plan for Long Term Goals  SHORT TERM GOAL WEEK 1 PT Short Term Goal 1 (Week 1): STG = LTG due to ELOS  Recommendations for other services: None    Skilled Therapeutic Intervention Mobility Bed Mobility Bed Mobility: Rolling Right;Rolling Left;Supine to Sit;Sit to Supine Rolling Right: Supervision/verbal cueing Rolling Left: Supervision/Verbal cueing Supine to Sit: Supervision/Verbal cueing Sit to Supine: Supervision/Verbal cueing Transfers Transfers: Sit to Stand;Stand to Sit;Stand Pivot Transfers Sit to Stand: Contact Guard/Touching assist Stand to Sit: Contact Guard/Touching assist Stand Pivot Transfers: Contact Guard/Touching assist Transfer (Assistive device): 1 person hand held assist Locomotion     Skilled Intervention: Pt greeted supine in bed to start - awake and agreeable but reports generalized fatigue and requests to rest. With encouragement, she was agreeable to PT evaluation. Initialed functional mobility as outlined above. She is able to complete bed mobility with supervision, stand<>pivot transfers with CGA and no AD, car transfers with CGA and no AD, and can ambulate >148f with CGA and no AD on level surfaces. She's somewhat frail and has global weakness and deconditioning with increased R sided weakness from her CVA. The daughter is at bedside and reports 24/7 S/A will be available at DC. She completed the BERG balance test with results listed above. Rest breaks needed b/w activities during balance test 2/2 fatigue.   Patient demonstrates increased fall risk as noted by score of 41/56 on Berg Balance Scale.  (<36= high risk for falls, close to 100%; 37-45 significant >80%; 46-51 moderate >50%; 52-55 lower >25%)   BP assessed in sitting: 96/55 with HR 90 BP assessed in standing: 105/57 with HR 93 *Measured to r/o orthostatics related to fatigue.  She completed session supine in bed with bed alarm on and all needs in reach.  Instructed pt in results of PT evaluation as detailed above, PT POC, rehab potential, rehab goals, and discharge recommendations. Additionally discussed CIR's policies regarding fall safety  and use of chair alarm and/or quick release belt. Pt verbalized understanding and in agreement. Will update pt's family members as they become available.   Discharge Criteria: Patient will be discharged from PT if patient refuses treatment 3 consecutive times without medical reason, if treatment goals not met, if there is a change in medical status, if patient makes  no progress towards goals or if patient is discharged from hospital.  The above assessment, treatment plan, treatment alternatives and goals were discussed and mutually agreed upon: by patient and by family  Alger Simons PT, DPT 11/05/2020, 2:17 PM

## 2020-11-05 NOTE — Progress Notes (Signed)
Inpatient Rehabilitation  Patient information reviewed and entered into eRehab system by Andres Vest Mazella Deen, OTR/L.   Information including medical coding, functional ability and quality indicators will be reviewed and updated through discharge.    

## 2020-11-05 NOTE — Progress Notes (Signed)
Inpatient Rehabilitation Care Coordinator Assessment and Plan Patient Details  Name: Frances Kent MRN: [id number] Date of Birth: 06-01-1930  Today's Date: 11/05/2020  Hospital Problems: Active Problems:   Left basal ganglia embolic stroke [facility])  Past Medical History:  Past Medical History:  Diagnosis Date   Atrial fibrillation ([place])    Benign breast cyst in female, left 10/07/2016   Colon adenomas    GERD (gastroesophageal reflux disease)    Hypertension    Hypothyroidism    Macular degeneration    Mitral regurgitation    Pulmonary embolism ([place]) 02/2016   SVT (supraventricular tachycardia) ([place])    Past Surgical History:  Past Surgical History:  Procedure Laterality Date   APPENDECTOMY  1960   BREAST CYST ASPIRATION Left 2017   CARDIOVERSION N/A 04/05/2016   Procedure: Cardioversion;  Surgeon: [name], MD;  Location: [facility];  Service: Cardiovascular;  Laterality: N/A;   CHOLECYSTECTOMY  1985   ESOPHAGOGASTRODUODENOSCOPY (EGD) WITH PROPOFOL N/A 11/27/2019   Procedure: ESOPHAGOGASTRODUODENOSCOPY (EGD) WITH PROPOFOL;  Surgeon: Lesly Rubenstein, MD;  Location: [facility];  Service: Endoscopy;  Laterality: N/A;   ESOPHAGOGASTRODUODENOSCOPY (EGD) WITH PROPOFOL N/A 12/24/2019   Procedure: ESOPHAGOGASTRODUODENOSCOPY (EGD) WITH PROPOFOL;  Surgeon: Lesly Rubenstein, MD;  Location: [facility];  Service: Endoscopy;  Laterality: N/A;   IR CT HEAD LTD  11/01/2020   IR PERCUTANEOUS ART THROMBECTOMY/INFUSION INTRACRANIAL INC DIAG ANGIO  11/01/2020   IR US GUIDE VASC ACCESS RIGHT  11/02/2020   OVARIAN CYST REMOVAL     RADIOLOGY WITH ANESTHESIA N/A 11/01/2020   Procedure: RADIOLOGY WITH ANESTHESIA;  Surgeon: Luanne Bras, MD;  Location: [place];  Service: Radiology;  Laterality: N/A;   TEE WITHOUT CARDIOVERSION N/A 02/01/2018   Procedure: TRANSESOPHAGEAL ECHOCARDIOGRAM (TEE);  Surgeon: [name], MD;  Location: [facility];  Service: Cardiovascular;   Laterality: N/A;   Social History:  reports that she has never smoked. She has never used smokeless tobacco. She reports that she does not drink alcohol and does not use drugs.  Family / Support Systems Marital Status: Widow/Widower Patient Roles: Parent ChildrenDoralee Kent [phone]  [name] [phone]-cell Other Supports: Three other children who are local and involved Anticipated Caregiver: Frances Kent and other family members Ability/Limitations of Caregiver: None-between all of them they can provide care Caregiver Availability: 24/7 Family Dynamics: Close knit family who have been providing assist since pt's decline over the past few months. Pt is the matriarch of her family and very independent up until this event  Social History Preferred language: English Religion: Baptist Cultural Background: No issues Education: [employer] - How often do you need to have someone help you when you read instructions, pamphlets, or other written material from your doctor or pharmacy?: Never Writes: Yes Employment Status: Retired Public relations account executive Issues: No issues Guardian/Conservator: Pt has HCPOA-Frances Kent her daughter is and she is aware the MD feels pt is not fully capable of making her decisions at this time. Will look toward Frances Kent to make any decision at this time   Abuse/Neglect Abuse/Neglect Assessment Can Be Completed: Yes Physical Abuse: Denies Verbal Abuse: Denies Sexual Abuse: Denies Exploitation of patient/patient's resources: Denies Self-Neglect: Denies  Patient response to: Social Isolation - How often do you feel lonely or isolated from those around you?: Never  Emotional Status Pt's affect, behavior and adjustment status: Pt is wanting to improve and be able to do for herself, she has always been able to do this, but has been declining in recent weeks.  Daughter is hopeful she will do well here and get to a level she can do some on her own Recent  Psychosocial Issues: other health issues-cardiac issues-eating issues due to esphagous issues Psychiatric History: History of anxiety takes medications for and finds it helpful. Pt is quite exhausted form not sleeping last night and her therapy this am Substance Abuse History: No issues  Patient / Family Perceptions, Expectations & Goals Pt/Family understanding of illness & functional limitations: Pt and daughter can explain her stroke and deficits, she is falling asleep while this worker is present, daughter has spoken with the MD and has had her questions answered. She and other family members will be here daily to provide support to pt while here Premorbid pt/family roles/activities: Mom, grandmother, retiree, church member, neighbor Anticipated changes in roles/activities/participation: resume Pt/family expectations/goals: Pt states: " I want to do well."  Daughter states: " We hope she can eat better and get stronger, she is a very independent person and always has been."  [employer]: None Premorbid Home Care/DME Agencies: None Transportation available at discharge: family members she has not driven for awhile now Is the patient able to respond to transportation needs?: Yes In the past 12 months, has lack of transportation kept you from medical appointments or from getting medications?: No In the past 12 months, has lack of transportation kept you from meetings, work, or from getting things needed for daily living?: No  Discharge Planning Living Arrangements: Other relatives (great granddaughter and her two young children) Support Systems: Children, Other relatives, Friends/neighbors, Social worker community Type of Residence: Private residence Insurance underwriter Resources: Multimedia programmer (specify) ([employer] Medicare) Financial Resources: Radio broadcast assistant Screen Referred: No Living Expenses: Own Money Management: Patient, Family Does the patient have any  problems obtaining your medications?: No Home Management: great granddaughter Patient/Family Preliminary Plans: Return home with family members assist with her care. They have been very involved prior to admission and will continue at discharge from CIR. Will await therapy evaluations and work on discharge needs. Care Coordinator Anticipated Follow Up Needs: HH/OP  Clinical Impression Pleasant exhausted pt who is falling asleep while worker is assessing her. Her daughter-Frances Kent is present and obtained information from her. She reports family members are very involved and will assist at discharge. They will also be here daily to provide support. Will await therapy evaluations and work on discharge needs.  [name] 11/05/2020, 12:27 PM

## 2020-11-06 LAB — BRAIN NATRIURETIC PEPTIDE: B Natriuretic Peptide: 757.7 pg/mL — ABNORMAL HIGH (ref 0.0–100.0)

## 2020-11-06 LAB — VITAMIN C: Vitamin C: 0.4 mg/dL (ref 0.4–2.0)

## 2020-11-06 MED ORDER — ADULT MULTIVITAMIN W/MINERALS CH
1.0000 | ORAL_TABLET | Freq: Every day | ORAL | Status: DC
  Administered 2020-11-06 – 2020-11-15 (×10): 1 via ORAL
  Filled 2020-11-06 (×10): qty 1

## 2020-11-06 MED ORDER — ENSURE ENLIVE PO LIQD: 237.0000 mL | Freq: Two times a day (BID) | ORAL | Status: DC

## 2020-11-06 MED ORDER — FUROSEMIDE 20 MG PO TABS
20.0000 mg | ORAL_TABLET | Freq: Every day | ORAL | Status: DC
  Administered 2020-11-07: 20 mg via ORAL
  Filled 2020-11-06 (×2): qty 1

## 2020-11-06 NOTE — Progress Notes (Signed)
Speech Language Pathology Daily Session Note  Patient Details  Name: MARSHAE AZAM MRN: [id number] Date of Birth: 1930/03/11  Today's Date: 11/06/2020 SLP Individual Time: 1000-1030 SLP Individual Time Calculation (min): 30 min  Short Term Goals: Week 1: SLP Short Term Goal 1 (Week 1): Patient will demonstrate adequate awareness to errors and attempt self-correction when completing simulated mildly complex ADL tasks with supervision level A. SLP Short Term Goal 2 (Week 1): Patient will demonstrate ability to perform simulated medication management (pill box) organization and money management (check register) tasks with minA verbal and visual cues for accuracy. SLP Short Term Goal 3 (Week 1): Patient will maintain 100% speech intelligibility during conversations with SLP with intermittent verbal cues for speech strategies. (increase vocal intensity, overarticulate). SLP Short Term Goal 4 (Week 1): Patient will demonstrate adequate reasoning and awareness during simulated functional problem solving tasks, with minA verbal and visual cues. SLP Short Term Goal 5 (Week 1): Patient will name at least 10 items in a given category with minA verbal, semantic cues.  Skilled Therapeutic Interventions:   Patient seen with daughter present for skilled ST session focusing on cognitive function goals. SLP presented patient with simulated check writing and check register activity. She required mod-max verbal, modeling and visual cues to perform as she seemed to be confused by task demands. For example, when cued to write a check corresponding to sample bill, she instead wrote a check for eye doctor in an amount similar to what she normally pays. She was then able to follow instructions however made frequent errors in transposing numbers and performing calculations (subtracting). SLP discussed with patient and daughter that this will be a task she will likely need help with upon discharge. Patient left in room,  sitting edge of bed with daughter in room. She continues to benefit from skilled SLP intervention to maximize cognitive-linguistic function prior to discharge.  Pain Pain Assessment Pain Scale: 0-10 Pain Score: 0-No pain  Therapy/Group: Individual Therapy  Sonia Baller, MA, CCC-SLP Speech Therapy

## 2020-11-06 NOTE — Progress Notes (Signed)
Initial Nutrition Assessment  DOCUMENTATION CODES:  Underweight  INTERVENTION:  Add Ensure Plus High Protein po BID, each supplement provides 350 kcal and 20 grams of protein.   Discontinue Boost Breeze TID.  Add MVI with minerals daily.  Encourage PO intake.  NUTRITION DIAGNOSIS:  Increased nutrient needs related to other (see comment) (rehab) as evidenced by estimated needs.  GOAL:  Patient will meet greater than or equal to 90% of their needs  MONITOR:  PO intake, Supplement acceptance, Labs, Weight trends, I & O's  REASON FOR ASSESSMENT:  Malnutrition Screening Tool    ASSESSMENT:  87 yo female with a PMH of CHF, A-fib with RVR, PE, HTN, anxiety, GERD, and hypothyroidism who was admitted with acute on chronic CHF exacerbation and LLL PNA. Now presents to rehab.  RD working remotely. Attempted to call patient's room phone. Pt did not answer.  Per Epic, pt ate 65% of breakfast this morning.  Per Epic, pt has lost ~8 lbs (7.2%) in the past 2 months, which is significant and severe for the time frame.  Pt likely still severely malnourished, but cannot definitively diagnose again at this time.  RD to order Ensure BID and MVI with minerals. RD to discontinue Boost Breeze TID.  Supplements: Boost Breeze TID  Medications: reviewed; TUMS BID, Pepcid BID, Synthroid, Klor-Con 20 mEq BID  Labs: reviewed; CBG 100-177 (H)  NUTRITION - FOCUSED PHYSICAL EXAM: Unable to perform - defer to in-person assessment  Diet Order:   Diet Order             Diet Heart Room service appropriate? Yes; Fluid consistency: Thin  Diet effective now                  EDUCATION NEEDS:  No education needs have been identified at this time  Skin:  Skin Assessment: Reviewed RN Assessment  Last BM:  11/06/20 x2  Height:  Ht Readings from Last 1 Encounters:  11/04/20 5\' 3"  (1.6 m)   Weight:  Wt Readings from Last 1 Encounters:  11/06/20 44.9 kg   BMI:  Body mass index is 17.53  kg/m.  Estimated Nutritional Needs:  Kcal:  1500-1700 Protein:  70-85 grams Fluid:  >1.5 L  Derrel Nip, RD, LDN (she/her/hers) Registered Dietitian I After-Hours/Weekend Pager # in [place]

## 2020-11-06 NOTE — Progress Notes (Signed)
Physical Therapy Session Note  Patient Details  Name: Frances Kent MRN: [id number] Date of Birth: 1930/03/13  Today's Date: 11/06/2020 PT Individual Time: 0800-0858  PT Individual Time Calculation (min): 58 min    Short Term Goals: Week 1:  PT Short Term Goal 1 (Week 1): STG = LTG due to ELOS  Skilled Therapeutic Interventions/Progress Updates:     Pt supine in bed at start of session - agreeable to PT tx without reports of pain. Continues to report generalized fatigue and lethargy. She presented with nasal canula on 0.5L. SpO2 reading 97% resting and on RA is at 95% resting. Monitoring oxygen throughout session. Supine<>sit completed with supervision. Sit<>stand with CGA and no AD and ambulates short distance in room to w/c with CGA and no AD. Family arriving and they were updated on pt's PT evaluation yesterday, PT goals, pt's current mobility status, etc. All questions/concerns addressed. Retrieved a w/c cushion for her w/c to improve skin protection and comfort. Wheeled to main rehab gym for time. Assisted to mat table with stand<>pivot with CGA and no AD. Completed "warm up" exercises including alternating LAQ and hip marches x2 minutes each. O2 95%-97% on RA after completion for each set. Gait training on level ground with CGA and no AD - 150ft + 140ft + 129ft + 83ft during our session. No overt knee buckling or LOB noted. Some slight unsteadiness noted and cues provided for safety awareness and reducing downward gaze. She continues to c/o generalized fatigue and questions why she feels so tired. In ADL apartment, completed furniture transfers from low sitting sofa couch with CGA. She was instructed to locate x4 items in ADL kitchen and she was able to recall 2/4 items and locate all 4 items when cued. She was able to reach into upper cabinets, lower cabinets, and drawers with supervision and no UE support. She was assisted back to her bed at completion of session with CGA. Bed mobility  completed at supervision level. Remained supine in bed with all needs in reach and family at bedside, bed alarm on.   Therapy Documentation Precautions:  Precautions Precautions: Fall Precaution Comments: Macular degeneration Restrictions Weight Bearing Restrictions: No Other Position/Activity Restrictions: Monitor BP General:    Therapy/Group: Individual Therapy  Alger Simons 11/06/2020, 7:37 AM

## 2020-11-06 NOTE — Progress Notes (Signed)
Physical Therapy Session Note  Patient Details  Name: Frances Kent MRN: [id number] Date of Birth: 02/20/1930  Pt politely refusing therapy - reporting she's too tired and requests to rest. Daughter at bedside. Pt reports her nurse is aware. Pt missed 30 minutes of skilled therapy due to fatigue.   Memorie Yokoyama P Shawon Denzer 11/06/2020, 1:09 PM

## 2020-11-06 NOTE — Progress Notes (Signed)
PROGRESS NOTE   Subjective/Complaints: Complains of fatigue and dizziness when standing with therapy Daughter and son at bedside- discussed that we consulted her cardiologist and will keep diltiazem at same dose.   ROS: +insomnia, +fatigue  Objective:   No results found. Recent Labs    11/04/20 0325 11/05/20 0519  WBC 8.0 7.4  HGB 9.0* 9.0*  HCT 29.4* 28.6*  PLT 331 328   Recent Labs    11/04/20 0325 11/05/20 0519  NA 140 138  K 4.4 3.8  CL 107 106  CO2 27 26  GLUCOSE 99 92  BUN 9 9  CREATININE 0.93 0.90  CALCIUM 8.4* 8.4*    Intake/Output Summary (Last 24 hours) at 11/06/2020 1007 Last data filed at 11/06/2020 0730 Gross per 24 hour  Intake 400 ml  Output --  Net 400 ml        Physical Exam: Vital Signs Blood pressure 120/64, pulse (!) 108, temperature 98.2 F (36.8 C), resp. rate 18, height 5\' 3"  (1.6 m), weight 44.9 kg, SpO2 96 %. Gen: no distress, normal appearing HEENT: oral mucosa pink and moist, NCAT Cardiovascular:     Comments: Irregularly irregular, tachycardic Pulmonary:     Effort: Pulmonary effort is normal. No respiratory distress.     Breath sounds: No stridor. No rhonchi.  Abdominal:     General: Abdomen is flat. Bowel sounds are normal. There is no distension.  Musculoskeletal:     Cervical back: Normal range of motion and neck supple.     Comments: No edema or tenderness in extremities  Skin:    General: Skin is warm and dry. IV in place Neurological:     Mental Status: She is alert.     Comments: Alert and oriented x2 Motor: RUE/RLE: 4+/5 proximal distal LUE/LE: 4/5 proximal to distal  Psychiatric:        Mood and Affect: Affect is flat.        Speech: Speech is delayed.        Behavior: Behavior is slowed.    Assessment/Plan: 1. Functional deficits which require 3+ hours per day of interdisciplinary therapy in a comprehensive inpatient rehab setting. Physiatrist is  providing close team supervision and 24 hour management of active medical problems listed below. Physiatrist and rehab team continue to assess barriers to discharge/monitor patient progress toward functional and medical goals  Care Tool:  Bathing    Body parts bathed by patient: Right arm, Left arm, Chest, Abdomen, Front perineal area, Right upper leg, Left upper leg, Face   Body parts bathed by helper: Buttocks, Left lower leg, Right lower leg     Bathing assist Assist Level: Minimal Assistance - Patient > 75%     Upper Body Dressing/Undressing Upper body dressing   What is the patient wearing?: Pull over shirt    Upper body assist Assist Level: Set up assist    Lower Body Dressing/Undressing Lower body dressing      What is the patient wearing?: Pants, Incontinence brief     Lower body assist Assist for lower body dressing: Minimal Assistance - Patient > 75%     Toileting Toileting    Toileting assist Assist for  toileting: Minimal Assistance - Patient > 75%     Transfers Chair/bed transfer  Transfers assist     Chair/bed transfer assist level: Contact Guard/Touching assist     Locomotion Ambulation   Ambulation assist      Assist level: Contact Guard/Touching assist Assistive device: No Device Max distance: 15ft   Walk 10 feet activity   Assist     Assist level: Contact Guard/Touching assist Assistive device: No Device   Walk 50 feet activity   Assist    Assist level: Contact Guard/Touching assist Assistive device: No Device    Walk 150 feet activity   Assist    Assist level: Contact Guard/Touching assist Assistive device: No Device    Walk 10 feet on uneven surface  activity   Assist     Assist level: Contact Guard/Touching assist     Wheelchair     Assist Is the patient using a wheelchair?: No             Wheelchair 50 feet with 2 turns activity    Assist            Wheelchair 150 feet  activity     Assist          Blood pressure 120/64, pulse (!) 108, temperature 98.2 F (36.8 C), resp. rate 18, height 5\' 3"  (1.6 m), weight 44.9 kg, SpO2 96 %.    Medical Problem List and Plan: 1.  Weakness, balance deficits with posterior lean, as well as SOB/tachycardia with activity secondary to left ICA occlusion.             -patient may shower             -ELOS/Goals: 7-10 days/supervision             Continue CIR 2.  H/o DVT/A fib/Antithrombotics: -DVT/anticoagulation:  Pharmaceutical: Xarelto             -antiplatelet therapy: N/A 3. Pain Management: N/A 4. Mood: LCSW to follow for evaluation and support.              -antipsychotic agents: N/A 5. Neuropsych: This patient is not fully capable of making decisions on her own behalf. 6. Skin/Wound Care: Routine pressure relief measures.  7. Fluids/Electrolytes/Nutrition: Monitor I/Os 8. Afib with RVR: Monitor HR TID--continue cardizem, amiodarone and Xarelto.             --Monitor heart rate with increased exertion. Has had malaise/poor intake X weeks.              --EKG with prolonged Qtc- discussed with her outpatient cardiologist.  --Has pending eval with EP for PPM. SSS?   9. HTN: Monitor BP TID--continues to have intermittent hypotension with SBP in 90's             Orthostatics ordered 10. CAP: Completed course of ceftriaxone, discussed with patient and daughter.  11. Hyperlipidemia: Continue Zetia due to intolerance of statins.  12.  AKI: resolved.  13. Acute on chronic systolic CHF: Compensated--will modify diet to heart healthy.  --monitor for signs of overload and check daily weights.  14. Hypocalcemia: Will add tums for supplement. 15. H/o esophageal stricture/GERD: Off PPI due to prolonged QTC--will replace with pepcid to prevent symptoms --continue reflux precautions.    16. Insomnia: Cardizem dosing changed so she is not woken at midnight.     LOS: 2 days A FACE TO FACE EVALUATION WAS  PERFORMED  Delena Casebeer P Dashawna Delbridge 11/06/2020, 10:07 AM

## 2020-11-06 NOTE — Progress Notes (Signed)
Occupational Therapy Session Note  Patient Details  Name: Frances Kent MRN: [id number] Date of Birth: June 03, 1930  Today's Date: 11/06/2020 OT Individual Time: 7096-2836 OT Individual Time Calculation (min): 68 min    Short Term Goals: Week 1:  OT Short Term Goal 1 (Week 1): STE=LTG 2/2 ELOS  Skilled Therapeutic Interventions/Progress Updates:    Pt received semi-reclined in bed with daughter present, denies pain, but endorses fatigue from previous PT session, agreeable to therapy. Session focus on IADL retraining, activity tolerance, RUE NMR, dynamic/static standing balance, energy conservation education in prep for improved ADL/IADL/func mobility performance + decreased caregiver burden. Came to sitting EOB and donned B socks with S. Short ambulatory transfer > w/c throughout session with CGA and no AD. Total A w/c transport to and from gym for time management/energy conservation.   Pt verbalized required ingredients/ kitchen items to bake cookies with min A to recall necessary items. Required assist to read instructions as pt did not have her glasses. Pt requested to sit down to mix in ingredients due to fatigue. Education on energy conservation techniques when returning to cooking at home, pt endorses that she cooks a fair amount (pinto beans, greens, etc.) Encouraged pt to use RUE to stir ingredients for RUE NMR, required assist from LUE to mix ingredients thoroughly. Pt able to dole out cookies to baking sheet appropriately. Therapist assisted with gathering items/placing in oven due to time constraints/fatigue. Pt able to clean all dishes standing at sink, one instance of dropping bowl from RUE. Required seated rest break halfway through cleaning 5 dishes.  Pt able to identify 3/5 safety hazards in kitchen, required max visual and verbal cues to identify open cabinets/running water as potential hazards.   SatO2 at 96-98% on RA throughout session. Pt reports 6/10 fatigue on modified RPE.  Educated on importance of self-monitoring for fatigue to reduce falls risk.  Short ambulatory transfer to bed and returned to supine with S. Pt left semi-reclined in bed  with bed alarm engaged, call bell in reach, and all immediate needs met. Daughter present.    Therapy Documentation Precautions:  Precautions Precautions: Fall Precaution Comments: Macular degeneration Restrictions Weight Bearing Restrictions: No Other Position/Activity Restrictions: Monitor BP  Pain: Pain Assessment Pain Scale: 0-10 Pain Score: 0-No pain ADL: See Care Tool for more details.   Therapy/Group: Individual Therapy  Volanda Napoleon MS, OTR/L  11/06/2020, 6:45 AM

## 2020-11-06 NOTE — Progress Notes (Addendum)
Subjective:  Patient seen and examined approximately 7:45 AM Patient alert resting comfortably in bed with 2 L/min supplemental oxygen via nasal cannula Patient feeling well overall.  Does report fatigue yesterday after physical therapy. Denies chest pain, palpitations, dizziness.   Intake/Output from previous day:  I/O last 3 completed shifts: In: 360 [P.O.:360] Out: -  Total I/O In: 100 [P.O.:100] Out: -   Blood pressure 120/64, pulse (!) 108, temperature 98.2 F (36.8 C), resp. rate 18, height 5' 3" (1.6 m), weight 44.9 kg, SpO2 96 %. Physical Exam Vitals reviewed.  HENT:     Head: Normocephalic and atraumatic.  Neck:     Vascular: No carotid bruit.  Cardiovascular:     Rate and Rhythm: Normal rate. Rhythm irregularly irregular.     Pulses: Intact distal pulses.     Heart sounds: S1 normal and S2 normal. Murmur heard.  Holosystolic murmur is present with a grade of 2/6 at the apex.    No gallop.  Pulmonary:     Effort: Pulmonary effort is normal. No respiratory distress.     Breath sounds: No wheezing, rhonchi or rales.  Musculoskeletal:     Right lower leg: No edema.     Left lower leg: No edema.  Neurological:     Mental Status: She is alert and oriented to person, place, and time.    Lab Results: BMP BNP (last 3 results) Recent Labs    10/15/20 1437 10/30/20 0939 11/01/20 0532  BNP 954.5* 1,073.7* 418.5*    ProBNP (last 3 results) No results for input(s): PROBNP in the last 8760 hours. BMP Latest Ref Rng & Units 11/05/2020 11/04/2020 11/03/2020  Glucose 70 - 99 mg/dL 92 99 86  BUN 8 - 23 mg/dL _0 Creatinine 0.44 - 1.00 mg/dL 0.90 0.93 0.94  Sodium 135 - 145 mmol/L 138 140 139  Potassium 3.5 - 5.1 mmol/L 3.8 4.4 3.6  Chloride 98 - 111 mmol/L 106 107 106  CO2 22 - 32 mmol/L _1 Calcium 8.9 - 10.3 mg/dL 8.4(L) 8.4(L) 8.1(L)   Hepatic Function Latest Ref Rng & Units 11/05/2020 11/01/2020 10/30/2020  Total Protein 6.5 - 8.1 g/dL 5.5(L) - 6.8   Albumin 3.5 - 5.0 g/dL 2.3(L) 2.6(L) 3.3(L)  AST 15 - 41 U/L 16 - 24  ALT 0 - 44 U/L 13 - 15  Alk Phosphatase 38 - 126 U/L 75 - 89  Total Bilirubin 0.3 - 1.2 mg/dL 0.7 - 1.8(H)  Bilirubin, Direct 0.0 - 0.2 mg/dL - - -   CBC Latest Ref Rng & Units 11/05/2020 11/04/2020 11/03/2020  WBC 4.0 - 10.5 K/uL 7.4 8.0 6.7  Hemoglobin 12.0 - 15.0 g/dL 9.0(L) 9.0(L) 9.2(L)  Hematocrit 36.0 - 46.0 % 28.6(L) 29.4(L) 29.5(L)  Platelets 150 - 400 K/uL 328 331 332   Lipid Panel     Component Value Date/Time   CHOL 144 11/02/2020 0509   TRIG 76 11/02/2020 0509   TRIG 72 11/02/2020 0509   HDL 48 11/02/2020 0509   CHOLHDL 3.0 11/02/2020 0509   VLDL 15 11/02/2020 0509   LDLCALC 81 11/02/2020 0509   LDLDIRECT 185.3 03/27/2013 1117   Cardiac Panel (last 3 results) No results for input(s): CKTOTAL, CKMB, TROPONINI, RELINDX in the last 72 hours.  HEMOGLOBIN A1C Lab Results  Component Value Date   HGBA1C 5.8 (H) 11/02/2020   MPG 119.76 11/02/2020   TSH Recent Labs    10/16/20 0659 10/30/20 0939 11/05/20 1848  TSH 0.880 2.300  2.451   Imaging: No results found.  Cardiac Studies:  EKG: 11/05/2020: Atrial fibrillation, 79 bpm, right bundle branch block, left anterior fascicular block, prolonged QT interval.  No significant change compared to prior ECG 11/06/2020: Atrial fibrillation with controlled ventricular response at a rate of 65 bpm.  Right bundle branch block, left anterior fascicular block.  Prolonged QT interval.   Echocardiogram: 11/01/2020: LVEF 40-45%, indeterminate diastolic pattern, normal right ventricular size and function, left atrium moderately dilated, right atrium moderately dilated, moderate to severe MR, moderate TR, suggestive of RAP 3 mmHg   Scheduled Meds:  amiodarone  100 mg Oral Daily   calcium carbonate  1 tablet Oral BID WC   ezetimibe  10 mg Oral Daily   famotidine  20 mg Oral BID   feeding supplement  1 Container Oral TID BM   levothyroxine  50 mcg Oral Q0600    metoprolol tartrate  50 mg Oral BID   potassium chloride  20 mEq Oral BID   Rivaroxaban  15 mg Oral Q supper   Continuous Infusions: PRN Meds:.acetaminophen, alum & mag hydroxide-simeth, bisacodyl, diphenhydrAMINE, guaiFENesin-dextromethorphan, ipratropium, melatonin, pantoprazole, polyethylene glycol, sodium phosphate  Assessment/Plan:  Persistent atrial fibrillation: Ventricular rate controlled. Rate control: Lopressor Rhythm control: Amiodarone Thromboembolic prophylaxis: Xarelto CHA2DS2-VASc SCORE is 7 which correlates to 9.6% risk of stroke per year (reduced LVEF, hypertension, age greater than 23, history of stroke, gender) Patient follows outpatient with Dr. [name] at [facility]. Review of patient's history reveals she is presently being treated with rate control strategy.  Ventricular rate remains well controlled on compression.  Prolonged QT: Patient has known history of prolonged QT. As long at 580 ms per EKG 09/08/2020 On review of repeat EKG QT interval is stable.  Continue amiodarone 100 mg p.o. daily Magnesium 2.2 TSH within normal limits Potassium goal of 4 and magnesium goal of 2 Continue to avoid QT prolonging agents   Long-term oral anticoagulation: Indication: Atrial fibrillation Currently on Xarelto Patient denies bleeding diathesis. Patient is aware of risks versus benefits and is agreement with continued oral anticoagulation   Hypokalemia: Continue potassium chloride supplements Potassium goal of 4   Chronic heart failure with reduced EF, stage C, NYHA class II/III: Clinically euvolemic. Obtain BNP Continue metoprolol Will consider up titration of medical therapy as able.   Will defer management of other chronic comorbidities to primary team  Patient was seen in collaboration with Dr. Terri Skains. He also reviewed patient's chart and examined the patient. Dr. Terri Skains is in agreement of the plan.    Alethia Berthold, PA-C 11/06/2020, 10:42 AM Office:  [phone]   ADDENDUM:   Shared Visit: Patient was independently interviewed and examined by me. This has been a shared visit with Lawerance Cruel, PA and  Dr. Terri Skains. I reviewed the above and agree the findings and recommendations, unless noted differently below.   Resting in bed comfortably.  Shortness of breath is chronic and stable.  Denies chest pain.    PHYSICAL EXAM: Vitals with BMI 11/06/2020 11/06/2020 11/06/2020  Height - - -  Weight - - -  BMI - - -  Systolic 654 650 354  Diastolic 87 64 75  Pulse 98 108 58   CONSTITUTIONAL: Well-developed and well-nourished. No acute distress.  SKIN: Skin is warm and dry. No rash noted. No cyanosis. No pallor. No jaundice HEAD: Normocephalic and atraumatic.  EYES: No scleral icterus MOUTH/THROAT: Moist oral membranes.  NECK: No JVD present. No thyromegaly noted. No carotid bruits  LYMPHATIC: No visible  cervical adenopathy.  CHEST Normal respiratory effort. No intercostal retractions  LUNGS: Clear to auscultation bilaterally.  No stridor. No wheezes. No rales.  CARDIOVASCULAR: Irregularly irregular, variable S1 and S2, soft holosystolic murmur heard at the apex ABDOMINAL: No apparent ascites.  EXTREMITIES: No peripheral edema  HEMATOLOGIC: No significant bruising NEUROLOGIC: Oriented to person, place, and time. Nonfocal. Normal muscle tone.  PSYCHIATRIC: Normal mood and affect. Normal behavior. Cooperative    12 lead EKG was personally reviewed by me: Atrial fibrillation controlled ventricular rate with prolonged QT   Assessment/Plan: Persistent atrial fibrillation Continue metoprolol.  Controlled. Continue amiodarone at a lower dose 100 mg p.o. daily for rhythm control. Continue oral anticoagulation for thromboembolic prophylaxis. Patient does not endorse any evidence of bleeding.  Chronic heart failure with reduced EF, stage C, NYHA class II/III Clinically appears to be euvolemic. BNP is elevated compared to prior  levels. Will start low-dose diuretic therapy. Continue current medical therapy Close monitoring.  Further recommendations to follow as the case evolves.   This note was created using a voice recognition software as a result there may be grammatical errors inadvertently enclosed that do not reflect the nature of this encounter. Every attempt is made to correct such errors.   Rex Kras, [place], [facility]  Pager: [phone] Office: [phone]

## 2020-11-07 ENCOUNTER — Inpatient Hospital Stay (HOSPITAL_COMMUNITY): Payer: Medicare HMO

## 2020-11-07 DIAGNOSIS — I5022 Chronic systolic (congestive) heart failure: Secondary | ICD-10-CM

## 2020-11-07 DIAGNOSIS — E876 Hypokalemia: Secondary | ICD-10-CM

## 2020-11-07 DIAGNOSIS — R9431 Abnormal electrocardiogram [ECG] [EKG]: Secondary | ICD-10-CM

## 2020-11-07 LAB — GLUCOSE, CAPILLARY: Glucose-Capillary: 95 mg/dL (ref 70–99)

## 2020-11-07 MED ORDER — METOPROLOL TARTRATE 25 MG PO TABS
25.0000 mg | ORAL_TABLET | Freq: Three times a day (TID) | ORAL | Status: DC
  Administered 2020-11-08: 25 mg via ORAL
  Filled 2020-11-07: qty 1

## 2020-11-07 MED ORDER — ALPRAZOLAM 0.25 MG PO TABS: 0.2500 mg | ORAL_TABLET | Freq: Every evening | ORAL | Status: DC | PRN

## 2020-11-07 MED ORDER — ALPRAZOLAM 0.25 MG PO TABS
0.2500 mg | ORAL_TABLET | Freq: Two times a day (BID) | ORAL | Status: DC | PRN
  Administered 2020-11-07 – 2020-11-10 (×4): 0.25 mg via ORAL
  Filled 2020-11-07 (×5): qty 1

## 2020-11-07 MED ORDER — METOPROLOL TARTRATE 25 MG PO TABS
25.0000 mg | ORAL_TABLET | Freq: Once | ORAL | Status: AC
  Administered 2020-11-07: 25 mg via ORAL
  Filled 2020-11-07: qty 1

## 2020-11-07 NOTE — Progress Notes (Signed)
Speech Language Pathology Daily Session Note  Patient Details  Name: BLAINE GUIFFRE MRN: [id number] Date of Birth: Jun 23, 1930  Today's Date: 11/07/2020 SLP Individual Time: 2122-4825 SLP Individual Time Calculation (min): 45 min  Short Term Goals: Week 1: SLP Short Term Goal 1 (Week 1): Patient will demonstrate adequate awareness to errors and attempt self-correction when completing simulated mildly complex ADL tasks with supervision level A. SLP Short Term Goal 2 (Week 1): Patient will demonstrate ability to perform simulated medication management (pill box) organization and money management (check register) tasks with minA verbal and visual cues for accuracy. SLP Short Term Goal 3 (Week 1): Patient will maintain 100% speech intelligibility during conversations with SLP with intermittent verbal cues for speech strategies. (increase vocal intensity, overarticulate). SLP Short Term Goal 4 (Week 1): Patient will demonstrate adequate reasoning and awareness during simulated functional problem solving tasks, with minA verbal and visual cues. SLP Short Term Goal 5 (Week 1): Patient will name at least 10 items in a given category with minA verbal, semantic cues.  Skilled Therapeutic Interventions:Skilled ST services focused on education and cognitive skills. Pt's daughter was present and expressed concern over medication changes impacting pt's fatigue. Pt expressed fatigue and "im not felling great" referring to changes in breathing and over fatigue. Family confirmed vitals were taken prior to [facility] arrival O2 was at 43, pt reported no acute differences from this reading. Pt was able to recall general medication prior to hospital, confirmed by daughter with min A verbal cues. SLP facilitated recall of current verse novel medications, creating medication list, however majority of session with spent collaborating with pt's daughter about desire for changes in medication creating list for MD as well as  educating family on current medications. Cardiologist MD arrived during session and provided rational for changes in medication due to pt's acute condition, after education, pt and pt's family appeared to comprehend need for changes and requested possible appetite stimulus. SLP notified nurse via secure chat on Epic. Pt was left in room with family and MD. SLP recommends to continue skilled services.     Pain Pain Assessment Pain Score: 0-No pain  Therapy/Group: Individual Therapy  Nyree Yonker  [facility] 11/07/2020, 10:48 AM

## 2020-11-07 NOTE — Progress Notes (Signed)
Physical Therapy Session Note  Patient Details  Name: Frances Kent MRN: [id number] Date of Birth: Jul 02, 1930  Today's Date: 11/07/2020 PT Missed Time: 31 Minutes Missed Time Reason: Patient fatigue  Pt received supine in bed with her daughter and son present and pt appearing very lethargic. Pt states she is very tired and pt's daughter reports she anticipates pt feels this way due to a change in pt's cardiac medications. Pt/family report pt ate a good lunch but didn't drink very much. Vitals assessed: BP 95/72 (MAP 80), HR 65bpm (fluctuating due to afib), SpO2 95% on RA. Pt politely declines participation in therapy at this time due to fatigue/exhaustion. Pt left supine in bed with needs in reach and family present. Missed 60 minutes of skilled physical therapy.  Tawana Scale , PT, DPT, NCS, CSRS 11/07/2020, 12:34 PM

## 2020-11-07 NOTE — Progress Notes (Signed)
Occupational Therapy Session Note  Patient Details  Name: Frances Kent MRN: [id number] Date of Birth: Dec 15, 1930  Today's Date: 11/07/2020 OT Individual Time: 8366-2947 OT Individual Time Calculation (min): 25 min    Short Term Goals: Week 1:  OT Short Term Goal 1 (Week 1): STE=LTG 2/2 ELOS  Skilled Therapeutic Interventions/Progress Updates:    Pt sidelying in bed with daughter and son standing at bedside.  Daughter shaking her head no upon OT arrival stating her mom will not be able to do any therapy today. Daughter stating that her mom is short of breath and in need of a procedure before she does any therapy.  OT reassured pt and family and educated regarding OT's role in energy conservation and adaptive equipment to manage fatigue. Pt reports no pain, just shortness of breath. Pts dtr appearing anxious and continuing to insist that therapy will be too much for patient today.  OT assisted pt in lowering HOB to allow for repositioning to improve comfort.  Dtr attempting to help boost pt up dependently, and OT encouraged pt to complete with increased independence.  Pt able to bridge towards [facility] with setup.  HOB slightly elevated and vitals assessed: BP 143/105, pulse ranging from 77-92 bpm, SPO2 on RA 93%. Nurse notified. Pt stating she doesn't need to sleep but she is too tired to do anything and wants to rest because she feels short of breath, although appearing to breathe easily.  Discontinued OT session per pt request. Missed 35 minutes of treatment. Call bell in reach, bed alarm on.    Therapy Documentation Precautions:  Precautions Precautions: Fall Precaution Comments: Macular degeneration Restrictions Weight Bearing Restrictions: No Other Position/Activity Restrictions: Monitor BP    Therapy/Group: Individual Therapy  Ezekiel Slocumb 11/07/2020, 8:37 AM

## 2020-11-07 NOTE — Plan of Care (Signed)
  Problem: Consults Goal: RH STROKE PATIENT EDUCATION Description: See Patient Education module for education specifics  Outcome: Progressing   Problem: RH BOWEL ELIMINATION Goal: RH STG MANAGE BOWEL WITH ASSISTANCE Description: STG Manage Bowel with  mod I Assistance. Outcome: Progressing Goal: RH STG MANAGE BOWEL W/MEDICATION W/ASSISTANCE Description: STG Manage Bowel with Medication with mod I Assistance. Outcome: Progressing   Problem: RH SAFETY Goal: RH STG ADHERE TO SAFETY PRECAUTIONS W/ASSISTANCE/DEVICE Description: STG Adhere to Safety Precautions With cues Assistance/Device. Outcome: Progressing   Problem: RH KNOWLEDGE DEFICIT Goal: RH STG INCREASE KNOWLEDGE OF DIABETES Description: Patient will be able to manage prediabetes w dietary modifications using handouts and educational resources w cues/reminders Outcome: Progressing Goal: RH STG INCREASE KNOWLEDGE OF HYPERTENSION Description: Patient will be able to manage HTN w  medications and dietary modifications using handouts and educational resources w cues/reminders Outcome: Progressing Goal: RH STG INCREASE KNOWLEGDE OF HYPERLIPIDEMIA Description: Patient will be able to manage HLD  w  medications and dietary modifications using handouts and educational resources w cues/reminders Outcome: Progressing Goal: RH STG INCREASE KNOWLEDGE OF STROKE PROPHYLAXIS Description: Patient will be able to manage secondary stroke risks w medications and dietary modifications using handouts and educational resources w cues/reminders Outcome: Progressing

## 2020-11-07 NOTE — Progress Notes (Signed)
Physical Therapy Session Note  Patient Details  Name: Frances Kent MRN: [id number] Date of Birth: [birth year]  Today's Date: 11/07/2020 PT Missed Time: 55 Minutes Missed Time Reason: Patient fatigue;Patient ill (Comment)  Pt received L sidelying in bed with her son and daughter present. Pt states she feels "terrible" and later describes it as feeling "horrible." Pt elaborates reporting she feels "fidgety" stating "I just can't relax, I'm going all to pieces." Therapist provided emotional support and offered to assist patient on a walk or transport her outside for a change of scenery; however, pt continues to decline stating she just feels "horrible" reporting "I don't feel like doing anything." Pt left in bed with her family present and nurse aware.  Tawana Scale , PT, DPT, NCS, CSRS  11/07/2020, 5:30 PM

## 2020-11-07 NOTE — Progress Notes (Signed)
Subjective:  Patient seen and examined at bedside accompanied by family member and daughter Tired and fatigued. Mild congestion and orthopnea. [name] has concerns with regards to beta-blocker therapy as opposed to calcium channel blockers   Blood pressure 127/84, 98% on room air, pulse 71 bpm, temperature 98.2 F Physical Exam Vitals reviewed.  HENT:     Head: Normocephalic and atraumatic.  Neck:     Vascular: No carotid bruit.  Cardiovascular:     Rate and Rhythm: Normal rate. Rhythm irregularly irregular.     Pulses: Intact distal pulses.     Heart sounds: S1 normal and S2 normal. Murmur heard.  Holosystolic murmur is present with a grade of 2/6 at the apex.    No gallop.  Pulmonary:     Effort: Pulmonary effort is normal. No respiratory distress.     Breath sounds: No wheezing, rhonchi or rales.  Musculoskeletal:     Right lower leg: No edema.     Left lower leg: No edema.  Neurological:     Mental Status: She is alert and oriented to person, place, and time.    Lab Results: BMP BNP (last 3 results) Recent Labs    10/30/20 0939 11/01/20 0532 11/06/20 1312  BNP 1,073.7* 418.5* 757.7*     ProBNP (last 3 results) No results for input(s): PROBNP in the last 8760 hours. BMP Latest Ref Rng & Units 11/05/2020 11/04/2020 11/03/2020  Glucose 70 - 99 mg/dL 92 99 86  BUN 8 - 23 mg/dL 9 9 12   Creatinine 0.44 - 1.00 mg/dL 0.90 0.93 0.94  Sodium 135 - 145 mmol/L 138 140 139  Potassium 3.5 - 5.1 mmol/L 3.8 4.4 3.6  Chloride 98 - 111 mmol/L 106 107 106  CO2 22 - 32 mmol/L 26 27 26   Calcium 8.9 - 10.3 mg/dL 8.4(L) 8.4(L) 8.1(L)   Hepatic Function Latest Ref Rng & Units 11/05/2020 11/01/2020 10/30/2020  Total Protein 6.5 - 8.1 g/dL 5.5(L) - 6.8  Albumin 3.5 - 5.0 g/dL 2.3(L) 2.6(L) 3.3(L)  AST 15 - 41 U/L 16 - 24  ALT 0 - 44 U/L 13 - 15  Alk Phosphatase 38 - 126 U/L 75 - 89  Total Bilirubin 0.3 - 1.2 mg/dL 0.7 - 1.8(H)  Bilirubin, Direct 0.0 - 0.2 mg/dL - - -    CBC Latest Ref Rng & Units 11/05/2020 11/04/2020 11/03/2020  WBC 4.0 - 10.5 K/uL 7.4 8.0 6.7  Hemoglobin 12.0 - 15.0 g/dL 9.0(L) 9.0(L) 9.2(L)  Hematocrit 36.0 - 46.0 % 28.6(L) 29.4(L) 29.5(L)  Platelets 150 - 400 K/uL 328 331 332   Lipid Panel     Component Value Date/Time   CHOL 144 11/02/2020 0509   TRIG 76 11/02/2020 0509   TRIG 72 11/02/2020 0509   HDL 48 11/02/2020 0509   CHOLHDL 3.0 11/02/2020 0509   VLDL 15 11/02/2020 0509   LDLCALC 81 11/02/2020 0509   LDLDIRECT 185.3 03/27/2013 1117   Cardiac Panel (last 3 results) No results for input(s): CKTOTAL, CKMB, TROPONINI, RELINDX in the last 72 hours.  HEMOGLOBIN A1C Lab Results  Component Value Date   HGBA1C 5.8 (H) 11/02/2020   MPG 119.76 11/02/2020   TSH Recent Labs    10/16/20 0659 10/30/20 0939 11/05/20 1848  TSH 0.880 2.300 2.451    Imaging: DG Chest 2 View  Result Date: 11/07/2020 CLINICAL DATA:  87 year old female with shortness of breath. EXAM: CHEST - 2 VIEW COMPARISON:  11/01/2020, 10/05/2020 FINDINGS: The patient is rotated to the left. The  mediastinal contours are within normal limits. Unchanged cardiomegaly. Atherosclerotic calcifications of the aortic arch. The right lung is clear. Similar-appearing obscuration of the left costophrenic angle. Diffuse osteopenia. No acute osseous abnormality. IMPRESSION: Similar to slightly improved trace left pleural effusion. Unchanged cardiomegaly. Aortic Atherosclerosis (ICD10-I70.0). Electronically Signed   By: Ruthann Cancer M.D.   On: 11/07/2020 11:46    Cardiac Studies:  EKG: 11/05/2020: Atrial fibrillation, 79 bpm, right bundle branch block, left anterior fascicular block, prolonged QT interval.  No significant change compared to prior ECG 11/06/2020: Atrial fibrillation with controlled ventricular response at a rate of 65 bpm.  Right bundle branch block, left anterior fascicular block.  Prolonged QT interval.   Echocardiogram: 11/01/2020: LVEF 40-45%,  indeterminate diastolic pattern, normal right ventricular size and function, left atrium moderately dilated, right atrium moderately dilated, moderate to severe MR, moderate TR, suggestive of RAP 3 mmHg   Scheduled Meds:  amiodarone  100 mg Oral Daily   calcium carbonate  1 tablet Oral BID WC   ezetimibe  10 mg Oral Daily   famotidine  20 mg Oral BID   feeding supplement  237 mL Oral BID BM   [START ON 11/09/2020] furosemide  10 mg Oral Daily   levothyroxine  50 mcg Oral Q0600   megestrol  400 mg Oral BID   metoprolol tartrate  25 mg Oral BID   multivitamin with minerals  1 tablet Oral Daily   potassium chloride  20 mEq Oral BID   Rivaroxaban  15 mg Oral Q supper   Continuous Infusions: PRN Meds:.acetaminophen, ALPRAZolam, alum & mag hydroxide-simeth, bisacodyl, diphenhydrAMINE, guaiFENesin-dextromethorphan, ipratropium, melatonin, pantoprazole, polyethylene glycol, sodium phosphate  Assessment/Plan:  Persistent atrial fibrillation: Ventricular rate controlled. Rate control: Lopressor Rhythm control: Amiodarone Thromboembolic prophylaxis: Xarelto CHA2DS2-VASc SCORE is 7 which correlates to 9.6% risk of stroke per year (reduced LVEF, hypertension, age greater than 69, history of stroke, gender) Patient follows outpatient with Dr. [name] at [facility]. Review of patient's history reveals she is presently being treated with rate control strategy.  Initially her ventricular rate was greater than 100 bpm and with increased physical activity with rehab wanted to prevent progression of A. fib with RVR.  Patient's dose of metoprolol was increased.  However it appears that the patient is feeling more tired and fatigued.  We will reduce metoprolol to 25 mg p.o. 3 times daily with holding parameters. Had a long discussion with the patient's daughter with regards to why Cardizem was discontinued.  She was made aware that the Cardizem is not indicated in reduced LVEF and it was administered every 6 hours  which at times affected her sleeping patterns per rehab providers  Chronic heart failure with reduced EF, stage C, NYHA class II/III: Clinically euvolemic. Clinically complaints of shortness of breath, congestion, orthopnea.   Check BMP which was trending upward. Start Lasix 20 mg p.o. daily.   Monitor for now.    Prolonged QT: Patient has known history of prolonged QT.  On review of repeat EKG QT interval is stable.  Continue amiodarone 100 mg p.o. daily Magnesium 2.2 TSH within normal limits Potassium goal of 4 and magnesium goal of 2 Continue to avoid QT prolonging agents   Long-term oral anticoagulation: Indication: Atrial fibrillation Currently on Xarelto Patient denies bleeding diathesis. Patient is aware of risks versus benefits and is agreement with continued oral anticoagulation   Hypokalemia: Continue potassium chloride supplements Potassium goal of 4  Will defer management of other chronic comorbidities to primary team  Total  time spent: 40 minutes discussing disease progression and management with the patient and her daughter at bedside.  Discussed medications, side effect profile, mechanism of action, and the questions and concerns with regards to side effects.  Patient and her daughter were educated on the management of atrial fibrillation and congestive heart failure.  Ordered additional diagnostic testing, medical therapy, and coordination of care.  This note was created using a voice recognition software as a result there may be grammatical errors inadvertently enclosed that do not reflect the nature of this encounter. Every attempt is made to correct such errors.   Rex Kras, [place], [facility]  Pager: [phone] Office: [phone]

## 2020-11-07 NOTE — IPOC Note (Signed)
Overall Plan of Care [facility]) Patient Details Name: Frances Kent MRN: [id number] DOB: [birth year]  Admitting Diagnosis: Left basal ganglia embolic stroke [facility])  Hospital Problems: Principal Problem:   Left basal ganglia embolic stroke [facility]) Active Problems:   Prolonged QT interval   Long term (current) use of anticoagulants   Hypokalemia   Chronic HFrEF (heart failure with reduced ejection fraction) ([place])     Functional Problem List: Nursing Medication Management, Bowel, Endurance, Safety  PT Balance, Endurance, Motor, Safety  OT Balance, Cognition, Endurance, Motor, Safety, Vision  SLP Linguistic, Nutrition, Cognition  TR         Basic ADL's: OT Grooming, Bathing, Dressing, Toileting     Advanced  ADL's: OT       Transfers: PT Bed Mobility, Bed to Chair, Teacher, early years/pre, Tub/Shower     Locomotion: PT Ambulation, Stairs     Additional Impairments: OT Fuctional Use of Upper Extremity  SLP Communication expression    TR      Anticipated Outcomes Item Anticipated Outcome  Self Feeding I  Swallowing  N/A   Basic self-care  Mod I to [employer] Transfers S  Bowel/Bladder  Manage bowel w mod I assist  Transfers  mod I  Locomotion  supervision  Communication  supervision  Cognition  supervision mild complex  Pain  n/a  Safety/Judgment  maintain w cues   Therapy Plan: PT Intensity: Minimum of 1-2 x/day ,45 to 90 minutes PT Frequency: 5 out of 7 days PT Duration Estimated Length of Stay: 5-7 days OT Intensity: Minimum of 1-2 x/day, 45 to 90 minutes OT Duration/Estimated Length of Stay: 7-10 days SLP Intensity: Minumum of 1-2 x/day, 30 to 90 minutes SLP Frequency: 3 to 5 out of 7 days SLP Duration/Estimated Length of Stay: 5-7 days   Due to the current state of emergency, patients may not be receiving their 3-hours of Medicare-mandated therapy.   Team Interventions: Nursing Interventions Patient/Family Education, Bowel  Management, Discharge Planning, Medication Management, Disease Management/Prevention  PT interventions Ambulation/gait training, Balance/vestibular training, Cognitive remediation/compensation, Community reintegration, Discharge planning, Disease management/prevention, DME/adaptive equipment instruction, Functional mobility training, Neuromuscular re-education, Pain management, Patient/family education, Psychosocial support, Skin care/wound management, Splinting/orthotics, Stair training, Therapeutic Activities, Therapeutic Exercise, UE/LE Strength taining/ROM, UE/LE Coordination activities, Visual/perceptual remediation/compensation, Wheelchair propulsion/positioning  OT Interventions Training and development officer, Cognitive remediation/compensation, Academic librarian, Discharge planning, DME/adaptive equipment instruction, Functional mobility training, Neuromuscular re-education, Patient/family education, Self Care/advanced ADL retraining, Therapeutic Activities, Therapeutic Exercise, UE/LE Strength taining/ROM, UE/LE Coordination activities  SLP Interventions Cognitive remediation/compensation, Environmental controls, Speech/Language facilitation, Functional tasks, Internal/external aids, Patient/family education, Medication managment  TR Interventions    SW/CM Interventions Discharge Planning, Psychosocial Support, Patient/Family Education   Barriers to Discharge MD  Medical stability  Nursing Decreased caregiver support, Home environment access/layout 2 level main B=B level entry w grand-daughter  PT Insurance for SNF coverage, Other (comments) Decreased activity tolerance and endurance  OT      SLP Other (comments) N/A patient has good family support  SW       Team Discharge Planning: Destination: PT-Home ,OT- Home , SLP-Home Projected Follow-up: PT-Outpatient PT, OT-  Outpatient OT, SLP-None Projected Equipment Needs: PT-To be determined, OT- To be determined, SLP-None recommended  by SLP Equipment Details: PT-Pt owns no DME, OT-  Patient/family involved in discharge planning: PT- Patient, Family member/caregiver,  OT-Patient, Family member/caregiver, SLP-Patient, Family member/caregiver  MD ELOS: 7 days Medical Rehab Prognosis:  Excellent Assessment: The patient has been  admitted for CIR therapies with the diagnosis of left basal ganglia infarction. The team will be addressing functional mobility, strength, stamina, balance, safety, adaptive techniques and equipment, self-care, bowel and bladder mgt, patient and caregiver education, NMR, cognition, communication, ego support. Goals have been set at supervision to mod I with self-care and mobility and supervision with cognition.   Due to the current state of emergency, patients may not be receiving their 3 hours per day of Medicare-mandated therapy.    Meredith Staggers, MD, FAAPMR     See Team Conference Notes for weekly updates to the plan of care

## 2020-11-07 NOTE — Progress Notes (Signed)
Patient was very restless and anxious last night. Stated she felt short of breath and shaky. All vital signs and blood sugar WDL. Patient in bed with call bell within reach. Will continue with plan of care.

## 2020-11-07 NOTE — Progress Notes (Addendum)
PROGRESS NOTE   Subjective/Complaints: Pt states that she still feels tired and weak. Nurse reported that patient c/o sob and was very anxious last night. Pt admitted to being anxious as well. All VS were stable. Pt feeling better this morning  ROS: Patient denies fever, rash, sore throat, blurred vision, nausea, vomiting, diarrhea, cough, chest pain, joint or back pain, headache, or mood change.   Objective:   No results found. Recent Labs    11/05/20 0519  WBC 7.4  HGB 9.0*  HCT 28.6*  PLT 328   Recent Labs    11/05/20 0519  NA 138  K 3.8  CL 106  CO2 26  GLUCOSE 92  BUN 9  CREATININE 0.90  CALCIUM 8.4*    Intake/Output Summary (Last 24 hours) at 11/07/2020 0811 Last data filed at 11/07/2020 0800 Gross per 24 hour  Intake 120 ml  Output --  Net 120 ml        Physical Exam: Vital Signs Blood pressure 127/84, pulse 71, temperature 97.8 F (36.6 C), temperature source Oral, resp. rate 18, height 5\' 3"  (1.6 m), weight 44.9 kg, SpO2 98 %. Constitutional: No distress . Vital signs reviewed. HEENT: NCAT, EOMI, oral membranes moist Neck: supple Cardiovascular: IRR IRR with murmur. No JVD    Respiratory/Chest: perhaps some crackles at bases. No wheezes, normal effort   GI/Abdomen: BS +, non-tender, non-distended Ext: no clubbing, cyanosis, or edema Psych: pleasant and cooperative but sl anxious  Skin:    General: Skin is warm and dry. IV in place Neurological:     Mental Status: She is alert.     Comments: Alert and oriented x2 Motor: RUE/RLE: 4+/5 proximal distal LUE/LE: 4/5 proximal to distal      Assessment/Plan: 1. Functional deficits which require 3+ hours per day of interdisciplinary therapy in a comprehensive inpatient rehab setting. Physiatrist is providing close team supervision and 24 hour management of active medical problems listed below. Physiatrist and rehab team continue to assess  barriers to discharge/monitor patient progress toward functional and medical goals  Care Tool:  Bathing    Body parts bathed by patient: Right arm, Left arm, Chest, Abdomen, Front perineal area, Right upper leg, Left upper leg, Face   Body parts bathed by helper: Buttocks, Left lower leg, Right lower leg     Bathing assist Assist Level: Minimal Assistance - Patient > 75%     Upper Body Dressing/Undressing Upper body dressing   What is the patient wearing?: Pull over shirt    Upper body assist Assist Level: Set up assist    Lower Body Dressing/Undressing Lower body dressing      What is the patient wearing?: Pants, Incontinence brief     Lower body assist Assist for lower body dressing: Minimal Assistance - Patient > 75%     Toileting Toileting    Toileting assist Assist for toileting: Contact Guard/Touching assist     Transfers Chair/bed transfer  Transfers assist     Chair/bed transfer assist level: Contact Guard/Touching assist     Locomotion Ambulation   Ambulation assist      Assist level: Contact Guard/Touching assist Assistive device: No Device Max distance: 148ft  Walk 10 feet activity   Assist     Assist level: Contact Guard/Touching assist Assistive device: No Device   Walk 50 feet activity   Assist    Assist level: Contact Guard/Touching assist Assistive device: No Device    Walk 150 feet activity   Assist    Assist level: Contact Guard/Touching assist Assistive device: No Device    Walk 10 feet on uneven surface  activity   Assist     Assist level: Contact Guard/Touching assist     Wheelchair     Assist Is the patient using a wheelchair?: No             Wheelchair 50 feet with 2 turns activity    Assist            Wheelchair 150 feet activity     Assist          Blood pressure 127/84, pulse 71, temperature 97.8 F (36.6 C), temperature source Oral, resp. rate 18, height 5\' 3"   (1.6 m), weight 44.9 kg, SpO2 98 %.    Medical Problem List and Plan: 1.  Weakness, balance deficits with posterior lean, as well as SOB/tachycardia with activity secondary to left ICA occlusion.             -patient may shower             -ELOS/Goals: 7-10 days/supervision             -Continue CIR therapies including PT, OT, and SLP  2.  H/o DVT/A fib/Antithrombotics: -DVT/anticoagulation:  Pharmaceutical: Xarelto             -antiplatelet therapy: N/A 3. Pain Management: N/A 4. Mood/anxiety: LCSW to follow for evaluation and support.              -antipsychotic agents: N/A  9/24-add LOW dose xanax prn at night for anxiety 5. Neuropsych: This patient is not fully capable of making decisions on her own behalf. 6. Skin/Wound Care: Routine pressure relief measures.  7. Fluids/Electrolytes/Nutrition: Monitor I/Os 8. Afib with RVR: Monitor HR TID--continue cardizem, amiodarone and Xarelto.             --Monitor heart rate with increased exertion. Has had malaise/poor intake X weeks.              --EKG with prolonged Qtc- discussed with her outpatient cardiologist.  --Has pending eval with EP for PPM. SSS?   -9/24 suspect sob was anxiety driven. EKG ordered and is pending  -demonstrates orthostasis on AM  VS  -cardiology actively following 9. HTN: Monitor BP TID--continues to have intermittent hypotension with SBP in 90's             -orthostatic as above 10. CAP: Completed course of ceftriaxone, discussed with patient and daughter.  11. Hyperlipidemia: Continue Zetia due to intolerance of statins.  12.  AKI: resolved.  13. Acute on chronic systolic CHF: Compensated--will modify diet to heart healthy.  9/24-weights appear stable, BNP 700+ -check cxr today -defer volume mgt to cards Filed Weights   11/04/20 1500 11/06/20 0500  Weight: 45.1 kg 44.9 kg     14. Hypocalcemia: Will add tums for supplement. 15. H/o esophageal stricture/GERD: Off PPI due to prolonged QTC--will replace with  pepcid to prevent symptoms --continue reflux precautions.    16. Insomnia: Cardizem dosing changed so she is not woken at midnight.    -melatonin  LOS: 3 days A FACE TO FACE EVALUATION WAS PERFORMED  Alroy Dust T  Naaman Plummer 11/07/2020, 8:11 AM

## 2020-11-08 LAB — BASIC METABOLIC PANEL
Anion gap: 7 (ref 5–15)
BUN: 11 mg/dL (ref 8–23)
CO2: 25 mmol/L (ref 22–32)
Calcium: 8.6 mg/dL — ABNORMAL LOW (ref 8.9–10.3)
Chloride: 108 mmol/L (ref 98–111)
Creatinine, Ser: 1.06 mg/dL — ABNORMAL HIGH (ref 0.44–1.00)
GFR, Estimated: 51 mL/min — ABNORMAL LOW (ref >60)
Glucose, Bld: 125 mg/dL — ABNORMAL HIGH (ref 70–99)
Potassium: 4.6 mmol/L (ref 3.5–5.1)
Sodium: 140 mmol/L (ref 135–145)

## 2020-11-08 LAB — VITAMIN B6: Vitamin B6: <1 ug/L — ABNORMAL LOW (ref 3.4–65.2)

## 2020-11-08 MED ORDER — FUROSEMIDE 20 MG PO TABS
10.0000 mg | ORAL_TABLET | Freq: Every day | ORAL | Status: DC
  Filled 2020-11-08: qty 1

## 2020-11-08 MED ORDER — MEGESTROL ACETATE 400 MG/10ML PO SUSP
400.0000 mg | Freq: Two times a day (BID) | ORAL | Status: DC
  Administered 2020-11-08 – 2020-11-13 (×11): 400 mg via ORAL
  Filled 2020-11-08 (×11): qty 10

## 2020-11-08 MED ORDER — METOPROLOL TARTRATE 25 MG PO TABS
25.0000 mg | ORAL_TABLET | Freq: Two times a day (BID) | ORAL | Status: DC
  Administered 2020-11-08 – 2020-11-10 (×5): 25 mg via ORAL
  Filled 2020-11-08 (×6): qty 1

## 2020-11-08 NOTE — Progress Notes (Signed)
PROGRESS NOTE   Subjective/Complaints:  Poor appetite per family , does better with soft foods   ROS: Patient denies CP, SOB,  N/V/D Objective:   DG Chest 2 View  Result Date: 11/07/2020 CLINICAL DATA:  87 year old female with shortness of breath. EXAM: CHEST - 2 VIEW COMPARISON:  11/01/2020, 10/05/2020 FINDINGS: The patient is rotated to the left. The mediastinal contours are within normal limits. Unchanged cardiomegaly. Atherosclerotic calcifications of the aortic arch. The right lung is clear. Similar-appearing obscuration of the left costophrenic angle. Diffuse osteopenia. No acute osseous abnormality. IMPRESSION: Similar to slightly improved trace left pleural effusion. Unchanged cardiomegaly. Aortic Atherosclerosis (ICD10-I70.0). Electronically Signed   By: Ruthann Cancer M.D.   On: 11/07/2020 11:46   No results for input(s): WBC, HGB, HCT, PLT in the last 72 hours.  No results for input(s): NA, K, CL, CO2, GLUCOSE, BUN, CREATININE, CALCIUM in the last 72 hours.   Intake/Output Summary (Last 24 hours) at 11/08/2020 0810 Last data filed at 11/07/2020 1816 Gross per 24 hour  Intake 200 ml  Output --  Net 200 ml         Physical Exam: Vital Signs Blood pressure 96/79, pulse 70, temperature 97.8 F (36.6 C), temperature source Oral, resp. rate 18, height 5\' 3"  (1.6 m), weight 44.9 kg, SpO2 100 %.  General: No acute distress Mood and affect are appropriate Heart: Regular rate and rhythm no rubs murmurs or extra sounds Lungs: Clear to auscultation, breathing unlabored, no rales or wheezes Abdomen: Positive bowel sounds, soft nontender to palpation, nondistended Extremities: No clubbing, cyanosis, or edema Skin: No evidence of breakdown, no evidence of rash    Skin:    General: Skin is warm and dry. IV in place Neurological:     Mental Status: She is alert.     Comments: Alert and oriented x2 Motor: RUE/RLE: 4+/5  proximal distal LUE/LE: 4/5 proximal to distal      Assessment/Plan: 1. Functional deficits which require 3+ hours per day of interdisciplinary therapy in a comprehensive inpatient rehab setting. Physiatrist is providing close team supervision and 24 hour management of active medical problems listed below. Physiatrist and rehab team continue to assess barriers to discharge/monitor patient progress toward functional and medical goals  Care Tool:  Bathing    Body parts bathed by patient: Right arm, Left arm, Chest, Abdomen, Front perineal area, Right upper leg, Left upper leg, Face   Body parts bathed by helper: Buttocks, Left lower leg, Right lower leg     Bathing assist Assist Level: Minimal Assistance - Patient > 75%     Upper Body Dressing/Undressing Upper body dressing   What is the patient wearing?: Pull over shirt    Upper body assist Assist Level: Set up assist    Lower Body Dressing/Undressing Lower body dressing      What is the patient wearing?: Pants, Incontinence brief     Lower body assist Assist for lower body dressing: Minimal Assistance - Patient > 75%     Toileting Toileting    Toileting assist Assist for toileting: Contact Guard/Touching assist     Transfers Chair/bed transfer  Transfers assist     Chair/bed transfer  assist level: Contact Guard/Touching assist     Locomotion Ambulation   Ambulation assist      Assist level: Contact Guard/Touching assist Assistive device: No Device Max distance: 151ft   Walk 10 feet activity   Assist     Assist level: Contact Guard/Touching assist Assistive device: No Device   Walk 50 feet activity   Assist    Assist level: Contact Guard/Touching assist Assistive device: No Device    Walk 150 feet activity   Assist    Assist level: Contact Guard/Touching assist Assistive device: No Device    Walk 10 feet on uneven surface  activity   Assist     Assist level: Contact  Guard/Touching assist     Wheelchair     Assist Is the patient using a wheelchair?: No             Wheelchair 50 feet with 2 turns activity    Assist            Wheelchair 150 feet activity     Assist          Blood pressure 96/79, pulse 70, temperature 97.8 F (36.6 C), temperature source Oral, resp. rate 18, height 5\' 3"  (1.6 m), weight 44.9 kg, SpO2 100 %.    Medical Problem List and Plan: 1.  Weakness, balance deficits with posterior lean, as well as SOB/tachycardia with activity secondary to left ICA occlusion.             -patient may shower             -ELOS/Goals: 7-10 days/supervision             -Continue CIR therapies including PT, OT, and SLP  2.  H/o DVT/A fib/Antithrombotics: -DVT/anticoagulation:  Pharmaceutical: Xarelto             -antiplatelet therapy: N/A 3. Pain Management: N/A 4. Mood/anxiety: LCSW to follow for evaluation and support.              -antipsychotic agents: N/A  9/24-add LOW dose xanax prn at night for anxiety 5. Neuropsych: This patient is not fully capable of making decisions on her own behalf. 6. Skin/Wound Care: Routine pressure relief measures.  7. Fluids/Electrolytes/Nutrition: Monitor I/Os 8. Afib with RVR: Monitor HR TID--continue cardizem, amiodarone and Xarelto.             --Monitor heart rate with increased exertion. Has had malaise/poor intake X weeks.              --EKG with prolonged Qtc- discussed with her outpatient cardiologist.  --Has pending eval with EP for PPM. SSS?   -9/24 suspect sob was anxiety driven. EKG ordered and is pending  -demonstrates orthostasis on AM  VS  -cardiology actively following 9. HTN: Monitor BP TID--continues to have intermittent hypotension with SBP in 90's             -orthostatic as above Vitals:   11/08/20 0550 11/08/20 0551  BP: 118/73 96/79  Pulse: 81 70  Resp: 18   Temp: 97.8 F (36.6 C)   SpO2: 100%     10. CAP: Completed course of ceftriaxone, discussed  with patient and daughter.  11. Hyperlipidemia: Continue Zetia due to intolerance of statins.  12.  AKI: resolved.  13. Acute on chronic systolic CHF: Compensated--will modify diet to heart healthy.  9/24-weights appear stable, BNP 700+ -check cxr today -defer volume mgt to cards Filed Weights   11/04/20 1500 11/06/20 0500  Weight:  45.1 kg 44.9 kg     14. Hypocalcemia: Will add tums for supplement. 15. H/o esophageal stricture/GERD: Off PPI due to prolonged QTC--will replace with pepcid to prevent symptoms --continue reflux precautions.    16. Insomnia: Cardizem dosing changed so she is not woken at midnight.    -melatonin 17.  Poor appetite place on D3 and start megace LOS: 4 days A FACE TO FACE EVALUATION WAS PERFORMED  Charlett Blake 11/08/2020, 8:10 AM

## 2020-11-08 NOTE — Plan of Care (Signed)
  Problem: Consults Goal: RH STROKE PATIENT EDUCATION Description: See Patient Education module for education specifics  Outcome: Progressing   Problem: RH BOWEL ELIMINATION Goal: RH STG MANAGE BOWEL WITH ASSISTANCE Description: STG Manage Bowel with  mod I Assistance. Outcome: Progressing Goal: RH STG MANAGE BOWEL W/MEDICATION W/ASSISTANCE Description: STG Manage Bowel with Medication with mod I Assistance. Outcome: Progressing   Problem: RH SAFETY Goal: RH STG ADHERE TO SAFETY PRECAUTIONS W/ASSISTANCE/DEVICE Description: STG Adhere to Safety Precautions With cues Assistance/Device. Outcome: Progressing   Problem: RH KNOWLEDGE DEFICIT Goal: RH STG INCREASE KNOWLEDGE OF DIABETES Description: Patient will be able to manage prediabetes w dietary modifications using handouts and educational resources w cues/reminders Outcome: Progressing Goal: RH STG INCREASE KNOWLEDGE OF HYPERTENSION Description: Patient will be able to manage HTN w  medications and dietary modifications using handouts and educational resources w cues/reminders Outcome: Progressing Goal: RH STG INCREASE KNOWLEGDE OF HYPERLIPIDEMIA Description: Patient will be able to manage HLD  w  medications and dietary modifications using handouts and educational resources w cues/reminders Outcome: Progressing Goal: RH STG INCREASE KNOWLEDGE OF STROKE PROPHYLAXIS Description: Patient will be able to manage secondary stroke risks w medications and dietary modifications using handouts and educational resources w cues/reminders Outcome: Progressing

## 2020-11-08 NOTE — Progress Notes (Signed)
Subjective:  Patient seen and examined at bedside accompanied by daughter. Continues to feel generalized tired and fatigue. Patient is less orthopneic and less congested.  Today's Vitals   11/08/20 0550 11/08/20 0551 11/08/20 1027 11/08/20 1217  BP: 118/73 96/79 117/74 100/74  Pulse: 81 70 78 (!) 58  Resp: 18  16 16   Temp: 97.8 F (36.6 C)  98.2 F (36.8 C) 98.5 F (36.9 C)  TempSrc: Oral  Oral   SpO2: 100%  97% 97%  Weight:      Height:      PainSc:   0-No pain    Body mass index is 17.53 kg/m.  Physical Exam Vitals reviewed.  Constitutional:      General: She is not in acute distress.    Appearance: She is not ill-appearing.  HENT:     Head: Normocephalic and atraumatic.  Neck:     Vascular: No carotid bruit.  Cardiovascular:     Rate and Rhythm: Normal rate. Rhythm irregularly irregular.     Pulses: Intact distal pulses.     Heart sounds: S1 normal and S2 normal. Murmur heard.  Holosystolic murmur is present with a grade of 2/6 at the apex.    No gallop.  Pulmonary:     Effort: Pulmonary effort is normal. No respiratory distress.     Breath sounds: No wheezing, rhonchi or rales.  Musculoskeletal:     Cervical back: Neck supple.     Right lower leg: No edema.     Left lower leg: No edema.  Neurological:     Mental Status: She is alert and oriented to person, place, and time.    Lab Results: BMP BNP (last 3 results) Recent Labs    10/30/20 0939 11/01/20 0532 11/06/20 1312  BNP 1,073.7* 418.5* 757.7*     ProBNP (last 3 results) No results for input(s): PROBNP in the last 8760 hours. BMP Latest Ref Rng & Units 11/05/2020 11/04/2020 11/03/2020  Glucose 70 - 99 mg/dL 92 99 86  BUN 8 - 23 mg/dL 9 9 12   Creatinine 0.44 - 1.00 mg/dL 0.90 0.93 0.94  Sodium 135 - 145 mmol/L 138 140 139  Potassium 3.5 - 5.1 mmol/L 3.8 4.4 3.6  Chloride 98 - 111 mmol/L 106 107 106  CO2 22 - 32 mmol/L 26 27 26   Calcium 8.9 - 10.3 mg/dL 8.4(L) 8.4(L) 8.1(L)   Hepatic Function  Latest Ref Rng & Units 11/05/2020 11/01/2020 10/30/2020  Total Protein 6.5 - 8.1 g/dL 5.5(L) - 6.8  Albumin 3.5 - 5.0 g/dL 2.3(L) 2.6(L) 3.3(L)  AST 15 - 41 U/L 16 - 24  ALT 0 - 44 U/L 13 - 15  Alk Phosphatase 38 - 126 U/L 75 - 89  Total Bilirubin 0.3 - 1.2 mg/dL 0.7 - 1.8(H)  Bilirubin, Direct 0.0 - 0.2 mg/dL - - -   CBC Latest Ref Rng & Units 11/05/2020 11/04/2020 11/03/2020  WBC 4.0 - 10.5 K/uL 7.4 8.0 6.7  Hemoglobin 12.0 - 15.0 g/dL 9.0(L) 9.0(L) 9.2(L)  Hematocrit 36.0 - 46.0 % 28.6(L) 29.4(L) 29.5(L)  Platelets 150 - 400 K/uL 328 331 332   Lipid Panel     Component Value Date/Time   CHOL 144 11/02/2020 0509   TRIG 76 11/02/2020 0509   TRIG 72 11/02/2020 0509   HDL 48 11/02/2020 0509   CHOLHDL 3.0 11/02/2020 0509   VLDL 15 11/02/2020 0509   LDLCALC 81 11/02/2020 0509   LDLDIRECT 185.3 03/27/2013 1117   Cardiac Panel (last 3 results)  No results for input(s): CKTOTAL, CKMB, TROPONINI, RELINDX in the last 72 hours.  HEMOGLOBIN A1C Lab Results  Component Value Date   HGBA1C 5.8 (H) 11/02/2020   MPG 119.76 11/02/2020   TSH Recent Labs    10/16/20 0659 10/30/20 0939 11/05/20 1848  TSH 0.880 2.300 2.451    Imaging: DG Chest 2 View  Result Date: 11/07/2020 CLINICAL DATA:  87 year old female with shortness of breath. EXAM: CHEST - 2 VIEW COMPARISON:  11/01/2020, 10/05/2020 FINDINGS: The patient is rotated to the left. The mediastinal contours are within normal limits. Unchanged cardiomegaly. Atherosclerotic calcifications of the aortic arch. The right lung is clear. Similar-appearing obscuration of the left costophrenic angle. Diffuse osteopenia. No acute osseous abnormality. IMPRESSION: Similar to slightly improved trace left pleural effusion. Unchanged cardiomegaly. Aortic Atherosclerosis (ICD10-I70.0). Electronically Signed   By: Ruthann Cancer M.D.   On: 11/07/2020 11:46    Cardiac Studies:  EKG: 11/05/2020: Atrial fibrillation, 79 bpm, right bundle branch block, left  anterior fascicular block, prolonged QT interval.  No significant change compared to prior ECG 11/06/2020: Atrial fibrillation with controlled ventricular response at a rate of 65 bpm.  Right bundle branch block, left anterior fascicular block.  Prolonged QT interval.   Echocardiogram: 11/01/2020: LVEF 40-45%, indeterminate diastolic pattern, normal right ventricular size and function, left atrium moderately dilated, right atrium moderately dilated, moderate to severe MR, moderate TR, suggestive of RAP 3 mmHg   Scheduled Meds:  amiodarone  100 mg Oral Daily   calcium carbonate  1 tablet Oral BID WC   ezetimibe  10 mg Oral Daily   famotidine  20 mg Oral BID   feeding supplement  237 mL Oral BID BM   [START ON 11/09/2020] furosemide  10 mg Oral Daily   levothyroxine  50 mcg Oral Q0600   megestrol  400 mg Oral BID   metoprolol tartrate  25 mg Oral BID   multivitamin with minerals  1 tablet Oral Daily   potassium chloride  20 mEq Oral BID   Rivaroxaban  15 mg Oral Q supper   Continuous Infusions: PRN Meds:.acetaminophen, ALPRAZolam, alum & mag hydroxide-simeth, bisacodyl, diphenhydrAMINE, guaiFENesin-dextromethorphan, ipratropium, melatonin, pantoprazole, polyethylene glycol, sodium phosphate  Assessment/Plan:  Persistent atrial fibrillation: Ventricular rate controlled. Rate control: Lopressor Rhythm control: Amiodarone Thromboembolic prophylaxis: Xarelto [id number]-VASc SCORE is 7 which correlates to 9.6% risk of stroke per year (reduced LVEF, hypertension, age greater than 34, history of stroke, gender) Patient follows outpatient with Dr. [name] at [facility]. Review of patient's history reveals she is presently being treated with rate control strategy.  Since she continues to feel generalized tired and fatigue we will reduce the dose of Lopressor to 25 mg p.o. twice daily.   I would still favor beta-blocker therapy as opposed to calcium channel blockers in the setting of mildly reduced LVEF.     Chronic heart failure with reduced EF, stage C, NYHA class II/III: Clinically euvolemic. Patient's symptoms of shortness of breath, congestion, and orthopnea have improved with better rate control and diuresis.   Decrease Lasix to 10 mg p.o. daily Monitor for now.    Prolonged QT: Patient has known history of prolonged QT.  On review of repeat EKG QT interval is stable.  Continue amiodarone 100 mg p.o. daily Magnesium 2.2 TSH within normal limits Potassium goal of 4 and magnesium goal of 2 Continue to avoid QT prolonging agents   Long-term oral anticoagulation: Indication: Atrial fibrillation Currently on Xarelto Patient denies bleeding diathesis. Patient is aware of risks versus  benefits and is agreement with continued oral anticoagulation   Hypokalemia: Continue potassium chloride supplements Potassium goal of 4  Will defer management of other chronic comorbidities to primary team  Total time spent: 35 minutes spent with regards to discussing disease process and management of congestive heart failure and her atrial fibrillation with the patient and her daughter during today's encounter.  Plan of care discussed with her daughter as well as the patient's nurse during morning rounds.  Change to medical therapy as mentioned above.  We will continue to follow the patient.  This note was created using a voice recognition software as a result there may be grammatical errors inadvertently enclosed that do not reflect the nature of this encounter. Every attempt is made to correct such errors.   Rex Kras, [place], [facility]  Pager: [phone] Office: [phone]

## 2020-11-09 ENCOUNTER — Telehealth: Payer: Self-pay | Admitting: Internal Medicine

## 2020-11-09 DIAGNOSIS — I4819 Other persistent atrial fibrillation: Secondary | ICD-10-CM

## 2020-11-09 MED ORDER — FAMOTIDINE 20 MG PO TABS
20.0000 mg | ORAL_TABLET | Freq: Every day | ORAL | Status: DC
  Filled 2020-11-09 (×3): qty 1

## 2020-11-09 NOTE — Progress Notes (Signed)
PROGRESS NOTE   Subjective/Complaints: Very fatigued this morning, but is alert and can answer questions well Working with Colletta Maryland at the sink  ROS: Patient denies CP, SOB,  N/V/D, +fatigue  Objective:   No results found. No results for input(s): WBC, HGB, HCT, PLT in the last 72 hours.  Recent Labs    11/08/20 1306  NA 140  K 4.6  CL 108  CO2 25  GLUCOSE 125*  BUN 11  CREATININE 1.06*  CALCIUM 8.6*    Intake/Output Summary (Last 24 hours) at 11/09/2020 1301 Last data filed at 11/09/2020 1258 Gross per 24 hour  Intake 480 ml  Output 1 ml  Net 479 ml        Physical Exam: Vital Signs Blood pressure 114/68, pulse 83, temperature 98.5 F (36.9 C), temperature source Oral, resp. rate 18, height 5\' 3"  (1.6 m), weight 44.9 kg, SpO2 99 %. Gen: no distress, normal appearing HEENT: oral mucosa pink and moist, NCAT Cardio: Reg rate Chest: normal effort, normal rate of breathing Abd: soft, non-distended Ext: no edema Psych: pleasant, normal affect     Skin:    General: Skin is warm and dry. IV in place Neurological:     Mental Status: She is alert.     Comments: Alert and oriented x2 Motor: RUE/RLE: 4+/5 proximal distal LUE/LE: 4/5 proximal to distal      Assessment/Plan: 1. Functional deficits which require 3+ hours per day of interdisciplinary therapy in a comprehensive inpatient rehab setting. Physiatrist is providing close team supervision and 24 hour management of active medical problems listed below. Physiatrist and rehab team continue to assess barriers to discharge/monitor patient progress toward functional and medical goals  Care Tool:  Bathing    Body parts bathed by patient: Right arm, Left arm, Chest, Abdomen, Front perineal area, Right upper leg, Left upper leg, Face   Body parts bathed by helper: Buttocks, Left lower leg, Right lower leg     Bathing assist Assist Level: Minimal  Assistance - Patient > 75%     Upper Body Dressing/Undressing Upper body dressing   What is the patient wearing?: Pull over shirt    Upper body assist Assist Level: Set up assist    Lower Body Dressing/Undressing Lower body dressing      What is the patient wearing?: Pants, Incontinence brief     Lower body assist Assist for lower body dressing: Minimal Assistance - Patient > 75%     Toileting Toileting    Toileting assist Assist for toileting: Contact Guard/Touching assist     Transfers Chair/bed transfer  Transfers assist     Chair/bed transfer assist level: Contact Guard/Touching assist     Locomotion Ambulation   Ambulation assist      Assist level: Contact Guard/Touching assist Assistive device: No Device Max distance: 90   Walk 10 feet activity   Assist     Assist level: Contact Guard/Touching assist Assistive device: No Device   Walk 50 feet activity   Assist    Assist level: Contact Guard/Touching assist Assistive device: No Device    Walk 150 feet activity   Assist    Assist level: Contact Guard/Touching assist  Assistive device: No Device    Walk 10 feet on uneven surface  activity   Assist     Assist level: Contact Guard/Touching assist     Wheelchair     Assist Is the patient using a wheelchair?: No             Wheelchair 50 feet with 2 turns activity    Assist            Wheelchair 150 feet activity     Assist          Blood pressure 114/68, pulse 83, temperature 98.5 F (36.9 C), temperature source Oral, resp. rate 18, height 5\' 3"  (1.6 m), weight 44.9 kg, SpO2 99 %.    Medical Problem List and Plan: 1.  Weakness, balance deficits with posterior lean, as well as SOB/tachycardia with activity secondary to left ICA occlusion.             -patient may shower             -ELOS/Goals: 7-10 days/supervision             -Continue CIR therapies including PT, OT, and SLP  2.  H/o DVT/A  fib/Antithrombotics: -DVT/anticoagulation:  Pharmaceutical: Xarelto             -antiplatelet therapy: N/A 3. Pain Management: N/A 4. Mood/anxiety: LCSW to follow for evaluation and support.              -antipsychotic agents: N/A  9/24-add LOW dose xanax prn at night for anxiety 5. Neuropsych: This patient is not fully capable of making decisions on her own behalf. 6. Skin/Wound Care: Routine pressure relief measures.  7. Fluids/Electrolytes/Nutrition: Monitor I/Os 8. Afib with RVR: Monitor HR TID--continue cardizem, amiodarone and Xarelto.             --Monitor heart rate with increased exertion. Has had malaise/poor intake X weeks.              --EKG with prolonged Qtc- discussed with her outpatient cardiologist.  --Has pending eval with EP for PPM. SSS?   -9/24 suspect sob was anxiety driven. EKG ordered and is pending  -demonstrates orthostasis on AM  VS  -cardiology actively following 9. HTN: Monitor BP TID--continues to have intermittent hypotension with SBP in 90's             -orthostatic as above Vitals:   11/09/20 0549 11/09/20 0550  BP: (!) 134/102 114/68  Pulse: 95 83  Resp:    Temp:    SpO2:      10. CAP: Completed course of ceftriaxone, discussed with patient and daughter.  11. Hyperlipidemia: Continue Zetia due to intolerance of statins.  12.  AKI: resolved.  13. Acute on chronic systolic CHF: Compensated--will modify diet to heart healthy.  9/24-weights appear stable, BNP 700+ -check cxr today -defer volume mgt to cards Filed Weights   11/04/20 1500 11/06/20 0500  Weight: 45.1 kg 44.9 kg     14. Hypocalcemia: Will add tums for supplement. 15. H/o esophageal stricture/GERD: Off PPI due to prolonged QTC--will replace with pepcid to prevent symptoms --continue reflux precautions.    16. Insomnia: Cardizem dosing changed so she is not woken at midnight.    -continue melatonin 17.  Poor appetite place on D3 and start megace, improved.  18. AKI: holding Lasix  today, discussed with daughter, repeat creatinine tomorrow.  10. Fatigue: lopressor dose decreased by cardiology  LOS: 5 days A FACE TO FACE EVALUATION WAS  PERFORMED  Frances Kent 11/09/2020, 1:01 PM

## 2020-11-09 NOTE — Telephone Encounter (Signed)
Pt is currently hospitalized at [facility] for a stroke. The daughter stated that she has noticed some signs of depression and that the cardiologist also stated that he has seen some signs. Daughter stated that the pt does not like being at the hospital at night by herself but daughter is unable to stay every night and that sometimes pt just doesn't want to talk. [name] said that she thinks that it is due to what all the pt has going on right now and will hopefully get better. I advised [name] that Dr. Derrel Nip is unable to prescribe medications for the pt while she is in the hospital under another physicians care but that if she see things getting worse she can talk to one of the physicians that are caring for her there and let them know what is going on. [name] gave a verbal understanding.

## 2020-11-09 NOTE — Progress Notes (Deleted)
Subjective:  Patient seen and examined at bedside accompanied by daughter, [name].  Patient reports continued fatigue.  Denies orthopnea.  Reports she has been able to ambulate in her room without issue.   Today's Vitals   11/09/20 0549 11/09/20 0550 11/09/20 1300 11/09/20 1402  BP: (!) 134/102 114/68  (!) 92/50  Pulse: 95 83  60  Resp:    18  Temp:    98 F (36.7 C)  TempSrc:    Oral  SpO2:    95%  Weight:      Height:      PainSc:   0-No pain    Body mass index is 17.53 kg/m.  Physical Exam Vitals reviewed.  Constitutional:      General: She is not in acute distress. HENT:     Head: Normocephalic and atraumatic.  Neck:     Vascular: No carotid bruit.  Cardiovascular:     Rate and Rhythm: Normal rate. Rhythm irregularly irregular.     Pulses: Intact distal pulses.     Heart sounds: S1 normal and S2 normal. Murmur heard.  Holosystolic murmur is present with a grade of 2/6 at the apex.    No gallop.  Pulmonary:     Effort: Pulmonary effort is normal. No respiratory distress.     Breath sounds: No wheezing, rhonchi or rales.  Musculoskeletal:     Cervical back: Neck supple.     Right lower leg: No edema.     Left lower leg: No edema.  Skin:    General: Skin is warm and dry.  Neurological:     Mental Status: She is alert and oriented to person, place, and time.    Lab Results: BMP BNP (last 3 results) Recent Labs    10/30/20 0939 11/01/20 0532 11/06/20 1312  BNP 1,073.7* 418.5* 757.7*    ProBNP (last 3 results) No results for input(s): PROBNP in the last 8760 hours. BMP Latest Ref Rng & Units 11/08/2020 11/05/2020 11/04/2020  Glucose 70 - 99 mg/dL 125(H) 92 99  BUN 8 - 23 mg/dL 11 9 9   Creatinine 0.44 - 1.00 mg/dL 1.06(H) 0.90 0.93  Sodium 135 - 145 mmol/L 140 138 140  Potassium 3.5 - 5.1 mmol/L 4.6 3.8 4.4  Chloride 98 - 111 mmol/L 108 106 107  CO2 22 - 32 mmol/L 25 26 27   Calcium 8.9 - 10.3 mg/dL 8.6(L) 8.4(L) 8.4(L)   Hepatic Function Latest Ref Rng &  Units 11/05/2020 11/01/2020 10/30/2020  Total Protein 6.5 - 8.1 g/dL 5.5(L) - 6.8  Albumin 3.5 - 5.0 g/dL 2.3(L) 2.6(L) 3.3(L)  AST 15 - 41 U/L 16 - 24  ALT 0 - 44 U/L 13 - 15  Alk Phosphatase 38 - 126 U/L 75 - 89  Total Bilirubin 0.3 - 1.2 mg/dL 0.7 - 1.8(H)  Bilirubin, Direct 0.0 - 0.2 mg/dL - - -   CBC Latest Ref Rng & Units 11/05/2020 11/04/2020 11/03/2020  WBC 4.0 - 10.5 K/uL 7.4 8.0 6.7  Hemoglobin 12.0 - 15.0 g/dL 9.0(L) 9.0(L) 9.2(L)  Hematocrit 36.0 - 46.0 % 28.6(L) 29.4(L) 29.5(L)  Platelets 150 - 400 K/uL 328 331 332   Lipid Panel     Component Value Date/Time   CHOL 144 11/02/2020 0509   TRIG 76 11/02/2020 0509   TRIG 72 11/02/2020 0509   HDL 48 11/02/2020 0509   CHOLHDL 3.0 11/02/2020 0509   VLDL 15 11/02/2020 0509   LDLCALC 81 11/02/2020 0509   LDLDIRECT 185.3 03/27/2013 1117  Cardiac Panel (last 3 results) No results for input(s): CKTOTAL, CKMB, TROPONINI, RELINDX in the last 72 hours.  HEMOGLOBIN A1C Lab Results  Component Value Date   HGBA1C 5.8 (H) 11/02/2020   MPG 119.76 11/02/2020   TSH Recent Labs    10/16/20 0659 10/30/20 0939 11/05/20 1848  TSH 0.880 2.300 2.451   Imaging: No results found.  Cardiac Studies:  EKG: 11/05/2020: Atrial fibrillation, 79 bpm, right bundle branch block, left anterior fascicular block, prolonged QT interval.  No significant change compared to prior ECG 11/07/2020: Atrial fibrillation with controlled ventricular response at a rate of 78 bpm.  Right bundle branch block, left anterior fascicular block.  Prolonged QT interval.  No significant change compared to previous EKG 11/06/2020.    Echocardiogram: 11/01/2020: LVEF 40-45%, indeterminate diastolic pattern, normal right ventricular size and function, left atrium moderately dilated, right atrium moderately dilated, moderate to severe MR, moderate TR, suggestive of RAP 3 mmHg   Scheduled Meds:  amiodarone  100 mg Oral Daily   calcium carbonate  1 tablet Oral BID WC    ezetimibe  10 mg Oral Daily   [START ON 11/10/2020] famotidine  20 mg Oral Daily   feeding supplement  237 mL Oral BID BM   levothyroxine  50 mcg Oral Q0600   megestrol  400 mg Oral BID   metoprolol tartrate  25 mg Oral BID   multivitamin with minerals  1 tablet Oral Daily   Rivaroxaban  15 mg Oral Q supper   Continuous Infusions: PRN Meds:.acetaminophen, ALPRAZolam, alum & mag hydroxide-simeth, bisacodyl, diphenhydrAMINE, guaiFENesin-dextromethorphan, ipratropium, melatonin, pantoprazole, polyethylene glycol, sodium phosphate  Assessment/Plan:  Persistent atrial fibrillation: Ventricular rate controlled. Rate control: Lopressor Rhythm control: Amiodarone Thromboembolic prophylaxis: Xarelto CHA2DS2-VASc SCORE is 7 which correlates to 9.6% risk of stroke per year (reduced LVEF, hypertension, age greater than 45, history of stroke, gender) Patient follows outpatient with Dr. [name] at [facility]. Review of patient's history reveals she is presently being treated with rate control strategy. Patient's rate remains well controlled during occupational therapy  Patient continues to have mild fatigue, however she states this is improving.  Prefer beta-clocker therapy to CCB given reduced LVEF.   Continue Lopressor 25 mg p.o. twice daily   Chronic heart failure with reduced EF, stage C, NYHA class II/III: Clinically euvolemic. Patient denies dyspnea or orthopnea.  Review of occupational therapy notes reveals patient blood pressure was soft during session.  Discontinue Lasix Continue to monitor daily weight and I&O. Notably I&O in chart is likely inaccurate   Prolonged QT: Patient has known history of prolonged QT. QT interval remains stable  Continue amiodarone 100 mg daily  Monitor potassium and magnesium, with goal of 4 and 2 respectively.  Continue to avoid QT prolonging agents   Long-term oral anticoagulation: Indication: Atrial fibrillation Tolerating Xarelto without bleeding  diathesis, will continue this.   Hypokalemia: Potassium goal of 4 Continue to monitor   Will defer management of other chronic comorbidities to primary team.   Patient was seen in collaboration with Dr. Terri Skains. He also reviewed patient's chart and examined the patient. Dr. Terri Skains is in agreement of the plan.    Alethia Berthold, PA-C 11/09/2020, 2:09 PM Office: [phone]  Dr. Terri Skains was unable to see patient at bedside, however discussed case and Dr. Terri Skains reviewed patient's chart. He was in agreement with the plan.    Alethia Berthold, PA-C 11/11/2020, 10:22 AM Office: [phone]

## 2020-11-09 NOTE — Telephone Encounter (Signed)
Patient is currently hospitalized in [place] for a stroke. [name] states the Patient seems depressed and she would like to know what information is needed from them to get help from Dr Derrel Nip for the Patient.   She would like a return call from [place] when she has a little more time to talk.

## 2020-11-09 NOTE — Progress Notes (Signed)
Occupational Therapy Session Note  Patient Details  Name: Frances Kent MRN: [id number] Date of Birth: 12/23/1930  Today's Date: 11/09/2020 OT Individual Time: 1005-1058 OT Individual Time Calculation (min): 53 min    Short Term Goals: Week 1:  OT Short Term Goal 1 (Week 1): STE=LTG 2/2 ELOS  Skilled Therapeutic Interventions/Progress Updates:    Pt semi reclined sleeping in bed, daughter in room.  Pt reporting no pain or shortness of breath but saying "Im just tired".  Pt's daughter asking to be cleared to take patient to the bathroom.  Vitals assessed: supine: BP 114/78(91) pulse 65 SPO2 99% on RA; sitting EOB BP 90/60(70) pulse 102(mild light headedness reported); end of session: BP 99/78 pulse 96.    Pt completed stand pivot EOB to w/c with CGA provided by daughter.  Transported pt to bathroom via w/c due to BP readings. Daughter having questions about safe w/c placement and assist technique during toilet transfer.  OT provided education and daughter return demonstrated with good understanding. Stand pivot w/c to toilet using grab bars also with daughter providing CGA.  Pt completed clothing mgt and pericare with close supervision.  Stand pivot back to w/c with CGA and transported to sink.  Pt washed hands sitting with setup.  Encouraged daughter to allow pt to complete task with increased independence due to OT observing daughter attempting to over assist.  Daughter receptive to encouraging more independence in pt.  Educated pt on benefits of sitting upright during the day as tolerated to promote increased tolerance.Stand pivot w/c to recliner with CGA provided by daughter. Noticed small skin tear on LUE and multiple bruises present on bilateral forearms, daughter reports this is due to blood thinners.  Provided pt with BUE double layer sock sleeves to protect skin integrity during functional mobility and self care.        Provision of min resistive sponge, instructed on gross grasp x 3-4  minutes twice daily bilateral hands.  Discussed at length regarding discharge planning with daughter due to pt falling asleep at end of session.  Per daughter, pt will live with grandaughter, however she has two children and stays busy taking care of them.  Daughter lives a couple of houses away and is willing and able to come take care of patient mostly.  Daughter interested in resources regarding possible intermittent aide assistance to supplement when she needs to take care of her grandchildren.  First floor setup with walk in shower and built in tub bench. Call bell in reach, seat alarm on at end of session.   Therapy Documentation Precautions:  Precautions Precautions: Fall Precaution Comments: Macular degeneration Restrictions Weight Bearing Restrictions: No Other Position/Activity Restrictions: Monitor BP    Therapy/Group: Individual Therapy  Ezekiel Slocumb 11/09/2020, 1:07 PM

## 2020-11-09 NOTE — Progress Notes (Signed)
Physical Therapy Session Note  Patient Details  Name: Frances Kent MRN: [id number] Date of Birth: 12-21-1930  Today's Date: 11/09/2020 PT Individual Time: [phone] and 1432-1530 PT Individual Time Calculation (min): 25 min and 58 min  Short Term Goals: Week 1:  PT Short Term Goal 1 (Week 1): STG = LTG due to ELOS  Skilled Therapeutic Interventions/Progress Updates:   First session:  Pt presents sitting in w/c and finishing taking meds w/ nursing.  Pt has no c/o pain but does state fatigue.  Pt agrees to therapy.  Pt transfers sit to stand w/ CGA and occ verbal cues for hand placement.  Pt amb into hallways 90', 100' x 2 w/o AD and CGA, although 2 episodes of scissoring w/ turns, requiring min A to maintain balance.  Pt requires seated rest break between trials 2/2 fatigue.  Pt remained sitting in w/c w/ chair alarm on and all needs in reach, dtr present.  Second session:  Pt presents supine in bed and pleasantly reluctant to participate 2/2 fatigue.  Pt transfers sup to sit w/ CGA but good initiation.  Pt transfers sit to stand w/ CGA and then SPT to w/c.  Pt wheeled to main gym for time and energy conservation.  Pt transferred multiple trials w/ CGA to close supervision.  Pt performed standing matching of face cards on back of mirror.  Verbal and visual cueing needed 2/2 pt not having classes with her.  Pt shown the 3 rows of [place], [place] and [place] but required reminders.  Pt performed standing hooking horseshoes over lowered hoop using B UEs as well as crossing midline w/o LOB.  Pt amb w/ CGA stepping over sticks on ground w/ min A required for balance.  Pt amb 150' from gym to room and transferred stand to sit w/ CGA, but decreased eccentric control 2/2 fatigue.  Pt transferred sit to supine w/ supervision.  BP checked at 109/65.  Bed alarm on and all needs in reach.  Dtr present.     Therapy Documentation Precautions:  Precautions Precautions: Fall Precaution Comments: Macular  degeneration Restrictions Weight Bearing Restrictions: No Other Position/Activity Restrictions: Monitor BP General:   Vital Signs:   Pain: 0/10       Therapy/Group: Individual Therapy  [name] 11/09/2020, 10:31 AM

## 2020-11-09 NOTE — Progress Notes (Signed)
Occupational Therapy Session Note  Patient Details  Name: Frances Kent MRN: [id number] Date of Birth: 04-Mar-1930  Today's Date: 11/09/2020 OT Individual Time: 2258-3462 OT Individual Time Calculation (min): 54 min    Short Term Goals: Week 1:  OT Short Term Goal 1 (Week 1): STE=LTG 2/2 ELOS  Skilled Therapeutic Interventions/Progress Updates:   Skilled monitoring of vitals throughout: Supine HOB elevated: 100/78 O2 100 on RA and 90-95 bpm Seated in w/c after oral care: 84/54 100 on RA and 100 bpm  Written order via secure chat for compression Seated after LB dressing with teds donned 101/74    Pt received in bed with daughter present. No pain reported, but very fatigued.  ADL: Pt completes ADL at overall supervision increased time for UB, MIN A for LB STS Level at sink. Skilled interventions include: min cuing to R attention for grasp, skilled monitoring of vitals, cuing for hand placemetn during SPT, VC for exaggerating movements with RUE to improve activation d/t poor perception/calibration skill, and Vc for hemi dressing strategy for threading Les. Pt requires total A for footwear d/t fatigue and tight teds. Prolonged rest breaks provided.   Pt left at end of session in w/c with exit alarm on, call light in reach and all needs met   Therapy Documentation Precautions:  Precautions Precautions: Fall Precaution Comments: Macular degeneration Restrictions Weight Bearing Restrictions: No Other Position/Activity Restrictions: Monitor BP General:     Therapy/Group: Individual Therapy  Tonny Branch 11/09/2020, 6:51 AM

## 2020-11-10 ENCOUNTER — Ambulatory Visit: Payer: Medicare HMO | Admitting: Family

## 2020-11-10 LAB — BASIC METABOLIC PANEL
Anion gap: 6 (ref 5–15)
BUN: 15 mg/dL (ref 8–23)
CO2: 25 mmol/L (ref 22–32)
Calcium: 8.5 mg/dL — ABNORMAL LOW (ref 8.9–10.3)
Chloride: 107 mmol/L (ref 98–111)
Creatinine, Ser: 1.04 mg/dL — ABNORMAL HIGH (ref 0.44–1.00)
GFR, Estimated: 52 mL/min — ABNORMAL LOW (ref >60)
Glucose, Bld: 90 mg/dL (ref 70–99)
Potassium: 4.6 mmol/L (ref 3.5–5.1)
Sodium: 138 mmol/L (ref 135–145)

## 2020-11-10 NOTE — Progress Notes (Signed)
PROGRESS NOTE   Subjective/Complaints: Continues to be very fatigued, has supervision goals and daughter is very attentive.  Abdominal binder has been ordered for orthostatic hypotension  ROS: Patient denies CP, SOB,  N/V/D, +fatigue, +dizziness with standing  Objective:   No results found. No results for input(s): WBC, HGB, HCT, PLT in the last 72 hours.  Recent Labs    11/08/20 1306 11/10/20 0519  NA 140 138  K 4.6 4.6  CL 108 107  CO2 25 25  GLUCOSE 125* 90  BUN 11 15  CREATININE 1.06* 1.04*  CALCIUM 8.6* 8.5*    Intake/Output Summary (Last 24 hours) at 11/10/2020 1311 Last data filed at 11/10/2020 0700 Gross per 24 hour  Intake 360 ml  Output --  Net 360 ml        Physical Exam: Vital Signs Blood pressure 107/83, pulse (!) 50, temperature 97.7 F (36.5 C), temperature source Oral, resp. rate 17, height 5\' 3"  (1.6 m), weight 40.1 kg, SpO2 100 %. Gen: no distress, normal appearing HEENT: oral mucosa pink and moist, NCAT Cardio: Bradycardic Chest: normal effort, normal rate of breathing Abd: soft, non-distended Ext: no edema Psych: pleasant, normal affect  Skin:    General: Skin is warm and dry. IV in place Neurological:     Mental Status: She is alert.     Comments: Alert and oriented x2 Motor: RUE/RLE: 4+/5 proximal distal LUE/LE: 4/5 proximal to distal      Assessment/Plan: 1. Functional deficits which require 3+ hours per day of interdisciplinary therapy in a comprehensive inpatient rehab setting. Physiatrist is providing close team supervision and 24 hour management of active medical problems listed below. Physiatrist and rehab team continue to assess barriers to discharge/monitor patient progress toward functional and medical goals  Care Tool:  Bathing    Body parts bathed by patient: Right arm, Left arm, Chest, Abdomen, Front perineal area, Right upper leg, Left upper leg, Face   Body  parts bathed by helper: Buttocks, Left lower leg, Right lower leg     Bathing assist Assist Level: Minimal Assistance - Patient > 75%     Upper Body Dressing/Undressing Upper body dressing   What is the patient wearing?: Pull over shirt    Upper body assist Assist Level: Set up assist    Lower Body Dressing/Undressing Lower body dressing      What is the patient wearing?: Pants, Incontinence brief     Lower body assist Assist for lower body dressing: Minimal Assistance - Patient > 75%     Toileting Toileting    Toileting assist Assist for toileting: Contact Guard/Touching assist     Transfers Chair/bed transfer  Transfers assist     Chair/bed transfer assist level: Contact Guard/Touching assist     Locomotion Ambulation   Ambulation assist      Assist level: Contact Guard/Touching assist Assistive device: No Device Max distance: 150   Walk 10 feet activity   Assist     Assist level: Contact Guard/Touching assist Assistive device: No Device   Walk 50 feet activity   Assist    Assist level: Contact Guard/Touching assist Assistive device: No Device    Walk 150  feet activity   Assist    Assist level: Contact Guard/Touching assist Assistive device: No Device    Walk 10 feet on uneven surface  activity   Assist     Assist level: Contact Guard/Touching assist     Wheelchair     Assist Is the patient using a wheelchair?: No             Wheelchair 50 feet with 2 turns activity    Assist            Wheelchair 150 feet activity     Assist          Blood pressure 107/83, pulse (!) 50, temperature 97.7 F (36.5 C), temperature source Oral, resp. rate 17, height 5\' 3"  (1.6 m), weight 40.1 kg, SpO2 100 %.    Medical Problem List and Plan: 1.  Weakness, balance deficits with posterior lean, as well as SOB/tachycardia with activity secondary to left ICA occlusion.             -patient may shower              -ELOS/Goals: 10 days/supervision             -Continue CIR therapies including PT, OT, and SLP  2.  H/o DVT/A fib/Antithrombotics: -DVT/anticoagulation:  Pharmaceutical: Xarelto             -antiplatelet therapy: N/A 3. Pain Management: N/A 4. Mood/anxiety: LCSW to follow for evaluation and support.              -antipsychotic agents: N/A  9/24-add LOW dose xanax prn at night for anxiety 5. Neuropsych: This patient is not fully capable of making decisions on her own behalf. 6. Skin/Wound Care: Routine pressure relief measures.  7. Fluids/Electrolytes/Nutrition: Monitor I/Os 8. Afib with RVR: Monitor HR TID--continue cardizem, amiodarone and Xarelto.             --Monitor heart rate with increased exertion. Has had malaise/poor intake X weeks.              --EKG with prolonged Qtc- discussed with her outpatient cardiologist.  --Has pending eval with EP for PPM. SSS?   -9/24 suspect sob was anxiety driven. EKG ordered and is pending  -demonstrates orthostasis on AM  VS  -cardiology actively following 9. HTN: Monitor BP TID--continues to have intermittent hypotension with SBP in 90's             -orthostatic as above Vitals:   11/10/20 0446 11/10/20 1141  BP: 100/73 107/83  Pulse: 67 (!) 50  Resp: 18 17  Temp: 98.1 F (36.7 C) 97.7 F (36.5 C)  SpO2: 98% 100%    10. CAP: Completed course of ceftriaxone, discussed with patient and daughter.  11. Hyperlipidemia: Continue Zetia due to intolerance of statins.  12.  AKI: resolved.  13. Acute on chronic systolic CHF: Compensated--will modify diet to heart healthy.  9/24-weights appear stable, BNP 700+ -check cxr today -defer volume mgt to cards Filed Weights   11/04/20 1500 11/06/20 0500 11/10/20 0500  Weight: 45.1 kg 44.9 kg 40.1 kg     14. Hypocalcemia: Will add tums for supplement. 15. H/o esophageal stricture/GERD: Off PPI due to prolonged QTC--will replace with pepcid to prevent symptoms --continue reflux precautions.    16.  Insomnia: Cardizem dosing changed so she is not woken at midnight.    -continue melatonin 17.  Poor appetite place on D3 and start megace, improved.  18. AKI: continue holding Lasix  today, discussed with daughter, repeat creatinine tomorrow.  10. Fatigue: lopressor dose decreased by cardiology, recommended NAC supplementation.  11. Bradycardia: continue to keep lasix off.   LOS: 6 days A FACE TO FACE EVALUATION WAS PERFORMED  Perri Lamagna P Husna Krone 11/10/2020, 1:11 PM

## 2020-11-10 NOTE — Progress Notes (Signed)
Orthopedic Tech Progress Note Patient Details:  Frances Kent 03-02-1930 [id number]  RN called this morning for an ABDOMINAL BINDER for patient.   Ortho Devices Type of Ortho Device: Abdominal binder Ortho Device/Splint Location: STOMACH Ortho Device/Splint Interventions: Ordered   Post Interventions Patient Tolerated: Well Instructions Provided: Care of device  Janit Pagan 11/10/2020, 12:25 PM

## 2020-11-10 NOTE — Progress Notes (Signed)
Patient ID: Frances Kent, female   DOB: 12/15/1930, 87 y.o.   MRN: [id number]  Met with pt and daughter to discuss team conference goals of supervision level and discharge date of 10/2. Both pleased with her having a good day today. Discussed equipment needs, daughter wants a transport chair and bsc, also rolling walker. Aware will need to get rolling walker on own due to insurance only covers one assistive device. Discussed home health and neither have a preference. Will continue to work on discharge needs.

## 2020-11-10 NOTE — Progress Notes (Signed)
Physical Therapy Session Note  Patient Details  Name: Frances Kent MRN: [id number] Date of Birth: [birth year]  Today's Date: 11/10/2020 PT Individual Time: 1333-1415 PT Individual Time Calculation (min): 42 min   Short Term Goals: Week 1:  PT Short Term Goal 1 (Week 1): STG = LTG due to ELOS Week 2:     Skilled Therapeutic Interventions/Progress Updates:  Pt denies pain.  Received Seated in recliner, therapist adjusted abdominal binder to proper fit. BP 84/58 Instructed pt and daughter in importance of slow transitions and use of cardiovascular "warm up" prior to transitioning to standing, avoiding static stance to prevent OH. Pt performed LAQs, seated heel raises, marching, overhead and forward UE press. Sit to stand and gait x 149ft w/no AD, min assist for balance. BP 103/51 Rested in sitting. Gait x 45ft and commode transfer w/cga to min assist in room. Pt continent of urine, daugter assisted pt w/commode transfer, return to recliner.  Therapist requested nursing order XS abd binder and thight high ted hose.  Therapist obtained RW. BP in recliner 103/55 Gait 128ft w/RW w/cga only, no lob.  Requested SW order RW for home use, pt/daughter in agreement w/this. BP 104/58 when returned to recliner. Discussed importance of monitoring BP at home, use of appropriate garments based on BP readings. Daughter receptive and voiced understanding. Pt left oob in recliner w/chair alarm set and needs in reach.   Therapy Documentation Precautions:  Precautions Precautions: Fall Precaution Comments: Macular degeneration Restrictions Weight Bearing Restrictions: No Other Position/Activity Restrictions: Monitor BP     Therapy/Group: Individual Therapy Callie Fielding, [place] 11/10/2020, 4:00 PM

## 2020-11-10 NOTE — Progress Notes (Signed)
Occupational Therapy Session Note  Patient Details  Name: Frances Kent MRN: [id number] Date of Birth: May 11, 1930  Today's Date: 11/10/2020 OT Individual Time: 1045-1200 OT Individual Time Calculation (min): 75 min    Short Term Goals: Week 1:  OT Short Term Goal 1 (Week 1): STE=LTG 2/2 ELOS  Skilled Therapeutic Interventions/Progress Updates:    Pt sitting upright in recliner, reporting "Im tired" and stating she doesn't feel up to taking a shower when offered by therapist.  Pt agreeable to changing clothes and asking to wash hair with encouragement from daughter.  Vitals assessed at beginning of session:  96/64 pulse 90 SPO2 99%.  PA notified and abdominal binder ordered.  Pt reports she drank some coffee this morning but hasnt been drinking water.  Educated pt on benefits of fluid intake and encourage pt to take frequent small sips of water throughout the day to promote increased BP and hemostasis.  Pt sipped water throughout session needing encouragement and Vcs to inititate.  Pt completed UB dressing with supervision and LB dressing with min assist due to ankle cuff tightness making it difficult for pt to thread feet into.  Setup to apply shampoo cap per pt request.  Daughter asking if she can bring in foods that agree to pt from home.  MD notified of daughters concern.  Abdominal binder arrived and needed total assist to donn.  BP reassessed after ten minutes with abdominal binder donned in sitting: 102/64.  Pt required significant time to complete self care due to fatigue and generalized weakness.  Call bell in reach, seat alarm on, BLE elevated.  Therapy Documentation Precautions:  Precautions Precautions: Fall Precaution Comments: Macular degeneration Restrictions Weight Bearing Restrictions: No Other Position/Activity Restrictions: Monitor BP    Therapy/Group: Individual Therapy  Ezekiel Slocumb 11/10/2020, 3:03 PM

## 2020-11-10 NOTE — Patient Care Conference (Signed)
Inpatient RehabilitationTeam Conference and Plan of Care Update Date: 11/10/2020   Time: 13:07 PM    Patient Name: Frances Kent      Medical Record Number: [id number]  Date of Birth: 03-28-1930 Sex: Female         Room/Bed: 4M04C/4M04C-01 Payor Info: Payor: [employer] MEDICARE / Plan: [employer] HMO / Product Type: *No Product type* /    Admit Date/Time:  11/04/2020  2:38 PM  Primary Diagnosis:  Left basal ganglia embolic stroke [facility])  Hospital Problems: Principal Problem:   Left basal ganglia embolic stroke [facility]) Active Problems:   Prolonged QT interval   Long term (current) use of anticoagulants   Hypokalemia   Chronic HFrEF (heart failure with reduced ejection fraction) [facility])    Expected Discharge Date: Expected Discharge Date: 11/15/20  Team Members Present: Physician leading conference: Dr. Leeroy Cha Social Worker Present: Ovidio Kin, LCSW Nurse Present: Dorien Chihuahua, RN PT Present: Callie Fielding, PT OT Present: Leretha Pol, OT SLP Present: Charolett Bumpers, SLP PPS Coordinator present : Gunnar Fusi, SLP     Current Status/Progress Goal Weekly Team Focus  Bowel/Bladder             Swallow/Nutrition/ Hydration             ADL's   setup-supervision seated UB ADLs; Min assist LB dressing; CGA toileting and transfers; poor endurance and activity tolerance; low BP when upright  S  endurance training, self care training, functional transfers, BUE strengthening, initiated dc planning   Mobility   overall cga w/bed mobility and transfers, gait >120ft no AD cga straight path/min assist at times w/functional gait/turning.  supervision to mod I  gait, transfers, dual task challenges (ie memory + balance), functional gait, balance   Communication   Impacted by fatigue and delayed verbal response, extra time min A  Supervision A  education, clear/short response due to reduce energy   Safety/Cognition/ Behavioral Observations  Max-Mod A impacted my fatigue.   Min A - downgraded due to fatigue  education, recall, attention, basic-mildly complex problem solving, error awareness   Pain             Skin               Discharge Planning:  Home to her home with family members coming in to provide 24/7 supervision level   Team Discussion: MD consulted with cardiology; adjusting BP medications, added binder with TEDs.  Megace for appetite increased,  and diet altered for specific foods she likes to eat.Family agreed to recommended N-acetyl-cysteine supplement and melatonin for sleep.   Patient on target to meet rehab goals: yes, currently CGA - Min assist overall. Progress limited by poor endurance, activity intolerance, fatigue and delayed processing and attention.Goals for discharge set for supervision overall.   *See Care Plan and progress notes for long and short-term goals.   Revisions to Treatment Plan:  Working on dual tasks, memory, balance/gait and speech intelligibility and breath support to express thoughts and needs.  Teaching Needs: Medications, secondary risk management, transfers, toileting, etc.   Current Barriers to Discharge: Decreased caregiver support and Home enviroment access/layout  Possible Resolutions to Barriers: Family education     Medical Summary Current Status: fatigue, atrial fibrillation, orthostatic hypotension, decreased appetite, BMI 15.66, poor activity tolerance  Barriers to Discharge: Medical stability  Barriers to Discharge Comments: fatigue, atrial fibrillation, orthostatic hypotension, decreased appetite, BMI 15.66,  poor activity tolerance Possible Resolutions to [employer] Focus: consulted cardiology, decreased lopressor dose, increase  megace, continue abdominal binder, recommended N-acetyl-cysteine   Continued Need for Acute Rehabilitation Level of Care: The patient requires daily medical management by a physician with specialized training in physical medicine and rehabilitation for the  following reasons: Direction of a multidisciplinary physical rehabilitation program to maximize functional independence : Yes Medical management of patient stability for increased activity during participation in an intensive rehabilitation regime.: Yes Analysis of laboratory values and/or radiology reports with any subsequent need for medication adjustment and/or medical intervention. : Yes   I attest that I was present, lead the team conference, and concur with the assessment and plan of the team.   Dorien Chihuahua B 11/10/2020, 3:05 PM

## 2020-11-10 NOTE — Progress Notes (Signed)
Physical Therapy Session Note  Patient Details  Name: Frances Kent MRN: [id number] Date of Birth: Sep 12, 1930  Today's Date: 11/10/2020 PT Individual Time: 0901-0928 PT Individual Time Calculation (min): 27 min   Short Term Goals: Week 1:  PT Short Term Goal 1 (Week 1): STG = LTG due to ELOS  Skilled Therapeutic Interventions/Progress Updates:  Pt received supine in bed, very fatigued but denied pain. Pt's daughter present for session. Took pt's vitals in supine, BP at 96/65 bpm. Supine <>sit EOB w/CGA and took BP again which read 108/66 bpm, pt denied symptoms of orthostasis throughout session. Sit <>stand w/CGA and pt ambulated 100' x2 w/min A and no AD. Noted occasional scissoring gait, consistent narrow BOS and decreased step length. Pt required short seated rest break in between gait trials. In room, pt stood to perform oral care at sink w/CGA. Stand pivot to recliner w/mod A for LOB correction due to narrow BOS and tripping on own foot. Pt was left seated in recliner in room w/daughter, all needs in reach.   Therapy Documentation Precautions:  Precautions Precautions: Fall Precaution Comments: Macular degeneration Restrictions Weight Bearing Restrictions: No Other Position/Activity Restrictions: Monitor BP   Therapy/Group: Individual Therapy Cruzita Lederer Anora Schwenke, PT, DPT  11/10/2020, 7:49 AM

## 2020-11-10 NOTE — Progress Notes (Signed)
Speech Language Pathology Daily Session Note  Patient Details  Name: [name] MRN: [id number] Date of Birth: 1931-01-06  Today's Date: 11/10/2020 SLP Individual Time: 0950-1030 SLP Individual Time Calculation (min): 40 min  Short Term Goals: Week 1: SLP Short Term Goal 1 (Week 1): Patient will demonstrate adequate awareness to errors and attempt self-correction when completing simulated mildly complex ADL tasks with supervision level A. SLP Short Term Goal 2 (Week 1): Patient will demonstrate ability to perform simulated medication management (pill box) organization and money management (check register) tasks with minA verbal and visual cues for accuracy. SLP Short Term Goal 3 (Week 1): Patient will maintain 100% speech intelligibility during conversations with SLP with intermittent verbal cues for speech strategies. (increase vocal intensity, overarticulate). SLP Short Term Goal 4 (Week 1): Patient will demonstrate adequate reasoning and awareness during simulated functional problem solving tasks, with minA verbal and visual cues. SLP Short Term Goal 5 (Week 1): Patient will name at least 10 items in a given category with minA verbal, semantic cues.  Skilled Therapeutic Interventions:Skilled ST services focused on cognitive skills. Pt's daughter was present. Pt expressed continued fatigue impacting participation. SLP facilitated recall, sustained attention, basic problem solving and error awareness skills in reading medication list, however pt required max A fade to mod A verbal cues to complete task, secondary to fatigue. Pt's daughter supported she would assist with medication management if needed. Pt was left in room with daughter, call bell within reach and chair alarm set. SLP downgraded goals to min A to reflect fatigue impacting functional participation. SLP recommends to continue skilled services.     Pain Pain Assessment Pain Score: 0-No pain  Therapy/Group: Individual  Therapy  Wrigley Plasencia  [facility] 11/10/2020, 1:55 PM

## 2020-11-10 NOTE — Plan of Care (Signed)
  Problem: RH Comprehension Communication Goal: LTG Patient will comprehend basic/complex auditory (SLP) Description: LTG: Patient will comprehend basic/complex auditory information with cues (SLP). Flowsheets (Taken 11/10/2020 1343) LTG: Patient will comprehend auditory information with cueing (SLP): (downgraded due to fatigue impacting function) Minimal Assistance - Patient > 75% Note: Downgraded due to fatigue impacting function   Problem: RH Expression Communication Goal: LTG Patient will verbally express basic/complex needs(SLP) Description: LTG:  Patient will verbally express basic/complex needs, wants or ideas with cues  (SLP) Flowsheets (Taken 11/10/2020 1343) LTG: Patient will verbally express basic/complex needs, wants or ideas (SLP): (downgraded due to fatigue impacting function) Minimal Assistance - Patient > 75% Note: Downgraded due to fatigue impacting function   Problem: RH Problem Solving Goal: LTG Patient will demonstrate problem solving for (SLP) Description: LTG:  Patient will demonstrate problem solving for basic/complex daily situations with cues  (SLP) Flowsheets (Taken 11/10/2020 1343) LTG: Patient will demonstrate problem solving for (SLP): (basic-mildly complex and functional) Basic daily situations LTG Patient will demonstrate problem solving for: (downgraded due to fatigue impacting function) Minimal Assistance - Patient > 75% Note: Downgrade due to fatigue impacting function   Problem: RH Memory Goal: LTG Patient will demonstrate ability for day to day (SLP) Description: LTG:   Patient will demonstrate ability for day to day recall/carryover during cognitive/linguistic activities with assist  (SLP) Flowsheets (Taken 11/10/2020 1343) LTG: Patient will demonstrate ability for day to day recall/carryover during cognitive/linguistic activities with assist (SLP): (downgraded due to fatigue impacting function) Minimal Assistance - Patient > 75% Note: Downgraded due to  fatigue impacting function

## 2020-11-11 LAB — BASIC METABOLIC PANEL
Anion gap: 4 — ABNORMAL LOW (ref 5–15)
BUN: 14 mg/dL (ref 8–23)
CO2: 25 mmol/L (ref 22–32)
Calcium: 8.4 mg/dL — ABNORMAL LOW (ref 8.9–10.3)
Chloride: 107 mmol/L (ref 98–111)
Creatinine, Ser: 1.06 mg/dL — ABNORMAL HIGH (ref 0.44–1.00)
GFR, Estimated: 51 mL/min — ABNORMAL LOW (ref >60)
Glucose, Bld: 92 mg/dL (ref 70–99)
Potassium: 4.8 mmol/L (ref 3.5–5.1)
Sodium: 136 mmol/L (ref 135–145)

## 2020-11-11 MED ORDER — ENSURE ENLIVE PO LIQD: 237.0000 mL | Freq: Two times a day (BID) | ORAL | Status: DC

## 2020-11-11 MED ORDER — METOPROLOL TARTRATE 12.5 MG HALF TABLET
12.5000 mg | ORAL_TABLET | Freq: Two times a day (BID) | ORAL | Status: AC
  Administered 2020-11-11: 12.5 mg via ORAL
  Filled 2020-11-11: qty 1

## 2020-11-11 MED ORDER — SODIUM CHLORIDE 0.9 % IV SOLN: INTRAVENOUS | Status: AC

## 2020-11-11 MED ORDER — BOOST / RESOURCE BREEZE PO LIQD CUSTOM
1.0000 | Freq: Three times a day (TID) | ORAL | Status: DC
  Administered 2020-11-11 – 2020-11-15 (×7): 1 via ORAL

## 2020-11-11 MED ORDER — METOPROLOL SUCCINATE ER 25 MG PO TB24
25.0000 mg | ORAL_TABLET | Freq: Every morning | ORAL | Status: DC
  Administered 2020-11-12: 25 mg via ORAL
  Administered 2020-11-13: 12.5 mg via ORAL
  Filled 2020-11-11 (×2): qty 1

## 2020-11-11 NOTE — Progress Notes (Signed)
Concerns addressed with both daughters and Dr. Brennan Bailey recommendations reviewed. Will add supplements thorough out the day to help with intake. Palliative care consulted to help them discuss [place]. Discussed Full code v/s partial code--they would like everything except for CPR.

## 2020-11-11 NOTE — Progress Notes (Signed)
Patient ID: Frances Kent, female   DOB: [birth year], 87 y.o.   MRN: [id number]  Met with daughter and sister in-law to answer questions and address concerns. They would like pt to be DNR, this is her wishes and stated in her Living will and HCPOA forms. They also want a Palliative Care consult while here, to follow at discharge or in case hospice appropriate. Both concerned pt is not medically stable for discharge on Sunday with her BP issues. Informed she will need to be medically stable before going home. Their concerns are not wanting to go home and bring her right back. Have messaged MD with their concerns. Continue to follow.

## 2020-11-11 NOTE — Progress Notes (Signed)
PROGRESS NOTE   Subjective/Complaints: Continues to be very fatigued, refused shower yesterday but did agree to changing clothes and washing hair Patient's chart reviewed- No issues reported overnight  ROS: Patient denies CP, SOB,  N/V/D, +fatigue, +dizziness with standing  Objective:   No results found. No results for input(s): WBC, HGB, HCT, PLT in the last 72 hours.  Recent Labs    11/10/20 0519 11/11/20 0547  NA 138 136  K 4.6 4.8  CL 107 107  CO2 25 25  GLUCOSE 90 92  BUN 15 14  CREATININE 1.04* 1.06*  CALCIUM 8.5* 8.4*    Intake/Output Summary (Last 24 hours) at 11/11/2020 0842 Last data filed at 11/11/2020 0754 Gross per 24 hour  Intake 480 ml  Output --  Net 480 ml        Physical Exam: Vital Signs Blood pressure (!) 84/58, pulse 86, temperature 98.4 F (36.9 C), temperature source Oral, resp. rate 18, height 5\' 3"  (1.6 m), weight 41.1 kg, SpO2 99 %. Gen: no distress, normal appearing HEENT: oral mucosa pink and moist, NCAT Cardio: Bradycardic, hypotensive Chest: normal effort, normal rate of breathing Abd: soft, non-distended Ext: no edema Psych: pleasant, normal affect  Skin:    General: Skin is warm and dry. IV in place Neurological:     Mental Status: She is alert.     Comments: Alert and oriented x2 Motor: RUE/RLE: 4+/5 proximal distal LUE/LE: 4/5 proximal to distal      Assessment/Plan: 1. Functional deficits which require 3+ hours per day of interdisciplinary therapy in a comprehensive inpatient rehab setting. Physiatrist is providing close team supervision and 24 hour management of active medical problems listed below. Physiatrist and rehab team continue to assess barriers to discharge/monitor patient progress toward functional and medical goals  Care Tool:  Bathing    Body parts bathed by patient: Right arm, Left arm, Chest, Abdomen, Front perineal area, Right upper leg, Left  upper leg, Face   Body parts bathed by helper: Buttocks, Left lower leg, Right lower leg     Bathing assist Assist Level: Minimal Assistance - Patient > 75%     Upper Body Dressing/Undressing Upper body dressing   What is the patient wearing?: Pull over shirt    Upper body assist Assist Level: Set up assist    Lower Body Dressing/Undressing Lower body dressing      What is the patient wearing?: Pants, Incontinence brief     Lower body assist Assist for lower body dressing: Minimal Assistance - Patient > 75%     Toileting Toileting    Toileting assist Assist for toileting: Contact Guard/Touching assist     Transfers Chair/bed transfer  Transfers assist     Chair/bed transfer assist level: Contact Guard/Touching assist     Locomotion Ambulation   Ambulation assist      Assist level: Minimal Assistance - Patient > 75% Assistive device: No Device Max distance: 150   Walk 10 feet activity   Assist     Assist level: Minimal Assistance - Patient > 75% Assistive device: No Device   Walk 50 feet activity   Assist    Assist level: Minimal Assistance -  Patient > 75% Assistive device: No Device    Walk 150 feet activity   Assist    Assist level: Contact Guard/Touching assist Assistive device: No Device    Walk 10 feet on uneven surface  activity   Assist     Assist level: Contact Guard/Touching assist     Wheelchair     Assist Is the patient using a wheelchair?: No             Wheelchair 50 feet with 2 turns activity    Assist            Wheelchair 150 feet activity     Assist          Blood pressure (!) 84/58, pulse 86, temperature 98.4 F (36.9 C), temperature source Oral, resp. rate 18, height 5\' 3"  (1.6 m), weight 41.1 kg, SpO2 99 %.    Medical Problem List and Plan: 1.  Weakness, balance deficits with posterior lean, as well as SOB/tachycardia with activity secondary to left ICA occlusion.              -patient may shower             -ELOS/Goals: 10 days/supervision             -Continue CIR therapies including PT, OT, and SLP  2.  H/o DVT/A fib/Antithrombotics: -DVT/anticoagulation:  Pharmaceutical: Xarelto             -antiplatelet therapy: N/A 3. Pain Management: N/A 4. Mood/anxiety: LCSW to follow for evaluation and support.              -antipsychotic agents: N/A  9/24-add LOW dose xanax prn at night for anxiety 5. Neuropsych: This patient is not fully capable of making decisions on her own behalf. 6. Skin/Wound Care: Routine pressure relief measures.  7. Fluids/Electrolytes/Nutrition: Monitor I/Os 8. Afib with RVR: Monitor HR TID--continue cardizem, amiodarone and Xarelto.             --Monitor heart rate with increased exertion. Has had malaise/poor intake X weeks.              --EKG with prolonged Qtc- discussed with her outpatient cardiologist.  --Has pending eval with EP for PPM. SSS?   -9/24 suspect sob was anxiety driven. EKG ordered and is pending  -demonstrates orthostasis on AM  VS  -cardiology actively following 9. HTN: Monitor BP TID--continues to have intermittent hypotension with SBP in 90's             -orthostatic as above Vitals:   11/11/20 0413 11/11/20 0747  BP: (!) 84/63 (!) 84/58  Pulse: 94 86  Resp: 18 18  Temp:  98.4 F (36.9 C)  SpO2: 92% 99%    10. CAP: Completed course of ceftriaxone, discussed with patient and daughter.  11. Hyperlipidemia: Continue Zetia due to intolerance of statins.  12.  AKI: resolved.  13. Acute on chronic systolic CHF: Compensated--will modify diet to heart healthy.  9/24-weights appear stable, BNP 700+ -check cxr today -defer volume mgt to cards [facility] Weights   11/06/20 0500 11/10/20 0500 11/11/20 0424  Weight: 44.9 kg 40.1 kg 41.1 kg     14. Hypocalcemia: Will add tums for supplement. 15. H/o esophageal stricture/GERD: Off PPI due to prolonged QTC--will replace with pepcid to prevent symptoms --continue reflux  precautions.    16. Insomnia: Cardizem dosing changed so she is not woken at midnight.    -continue melatonin 17.  Poor appetite place on D3  and start megace, improved.  18. AKI: continue holding Lasix today, discussed with daughter, repeat creatinine tomorrow.  10. Fatigue: lopressor dose decreased by cardiology, recommended NAC supplementation. Will consult cardiology regarding whether Lopressor can be further decreased  11. Bradycardia: continue to keep lasix off.  12. Hypotension: will ask cardiology if lopressor can be further decreased  LOS: 7 days A FACE TO FACE EVALUATION WAS PERFORMED  Clide Deutscher Frances Kent 11/11/2020, 8:42 AM

## 2020-11-11 NOTE — Progress Notes (Signed)
Subjective:  Patient seen and examined at bedside accompanied by daughter, [name].  Patient reports continued fatigue.  Denies orthopnea.  Reports she has been able to ambulate in her room without issue.   Today's Vitals   11/11/20 0410 11/11/20 0413 11/11/20 0424 11/11/20 0747  BP: 101/62 (!) 84/63  (!) 84/58  Pulse: 65 94  86  Resp: _0 Temp:    98.4 F (36.9 C)  TempSrc:    Oral  SpO2: 98% 92%  99%  Weight:   41.1 kg   Height:      PainSc:    0-No pain   Body mass index is 16.05 kg/m.  Physical Exam Vitals reviewed.  Constitutional:      General: She is not in acute distress. HENT:     Head: Normocephalic and atraumatic.  Neck:     Vascular: No carotid bruit.  Cardiovascular:     Rate and Rhythm: Normal rate. Rhythm irregularly irregular.     Pulses: Intact distal pulses.     Heart sounds: S1 normal and S2 normal. Murmur heard.  Holosystolic murmur is present with a grade of 2/6 at the apex.    No gallop.  Pulmonary:     Effort: Pulmonary effort is normal. No respiratory distress.     Breath sounds: No wheezing, rhonchi or rales.  Musculoskeletal:     Cervical back: Neck supple.     Right lower leg: No edema.     Left lower leg: No edema.  Skin:    General: Skin is warm and dry.  Neurological:     Mental Status: She is alert and oriented to person, place, and time.    Lab Results: BMP BNP (last 3 results) Recent Labs    10/30/20 0939 11/01/20 0532 11/06/20 1312  BNP 1,073.7* 418.5* 757.7*     ProBNP (last 3 results) No results for input(s): PROBNP in the last 8760 hours. BMP Latest Ref Rng & Units 11/11/2020 11/10/2020 11/08/2020  Glucose 70 - 99 mg/dL 92 90 125(H)  BUN 8 - 23 mg/dL _1 Creatinine 0.44 - 1.00 mg/dL 1.06(H) 1.04(H) 1.06(H)  Sodium 135 - 145 mmol/L 136 138 140  Potassium 3.5 - 5.1 mmol/L 4.8 4.6 4.6  Chloride 98 - 111 mmol/L 107 107 108  CO2 22 - 32 mmol/L _2 Calcium 8.9 - 10.3 mg/dL 8.4(L) 8.5(L) 8.6(L)    Hepatic Function Latest Ref Rng & Units 11/05/2020 11/01/2020 10/30/2020  Total Protein 6.5 - 8.1 g/dL 5.5(L) - 6.8  Albumin 3.5 - 5.0 g/dL 2.3(L) 2.6(L) 3.3(L)  AST 15 - 41 U/L 16 - 24  ALT 0 - 44 U/L 13 - 15  Alk Phosphatase 38 - 126 U/L 75 - 89  Total Bilirubin 0.3 - 1.2 mg/dL 0.7 - 1.8(H)  Bilirubin, Direct 0.0 - 0.2 mg/dL - - -   CBC Latest Ref Rng & Units 11/05/2020 11/04/2020 11/03/2020  WBC 4.0 - 10.5 K/uL 7.4 8.0 6.7  Hemoglobin 12.0 - 15.0 g/dL 9.0(L) 9.0(L) 9.2(L)  Hematocrit 36.0 - 46.0 % 28.6(L) 29.4(L) 29.5(L)  Platelets 150 - 400 K/uL 328 331 332   Lipid Panel     Component Value Date/Time   CHOL 144 11/02/2020 0509   TRIG 76 11/02/2020 0509   TRIG 72 11/02/2020 0509   HDL 48 11/02/2020 0509   CHOLHDL 3.0 11/02/2020 0509   VLDL 15 11/02/2020 0509   LDLCALC 81 11/02/2020 0509   LDLDIRECT 185.3 03/27/2013 1117  Cardiac Panel (last 3 results) No results for input(s): CKTOTAL, CKMB, TROPONINI, RELINDX in the last 72 hours.  HEMOGLOBIN A1C Lab Results  Component Value Date   HGBA1C 5.8 (H) 11/02/2020   MPG 119.76 11/02/2020   TSH Recent Labs    10/16/20 0659 10/30/20 0939 11/05/20 1848  TSH 0.880 2.300 2.451    Imaging: No results found.  Cardiac Studies:  EKG: 11/05/2020: Atrial fibrillation, 79 bpm, right bundle branch block, left anterior fascicular block, prolonged QT interval.  No significant change compared to prior ECG 11/07/2020: Atrial fibrillation with controlled ventricular response at a rate of 78 bpm.  Right bundle branch block, left anterior fascicular block.  Prolonged QT interval.  No significant change compared to previous EKG 11/06/2020.    Echocardiogram: 11/01/2020: LVEF 40-45%, indeterminate diastolic pattern, normal right ventricular size and function, left atrium moderately dilated, right atrium moderately dilated, moderate to severe MR, moderate TR, suggestive of RAP 3 mmHg   Scheduled Meds:  amiodarone  100 mg Oral Daily    calcium carbonate  1 tablet Oral BID WC   ezetimibe  10 mg Oral Daily   famotidine  20 mg Oral Daily   feeding supplement  237 mL Oral BID BM   levothyroxine  50 mcg Oral Q0600   megestrol  400 mg Oral BID   metoprolol tartrate  25 mg Oral BID   multivitamin with minerals  1 tablet Oral Daily   Rivaroxaban  15 mg Oral Q supper   Continuous Infusions:  sodium chloride     PRN Meds:.acetaminophen, alum & mag hydroxide-simeth, bisacodyl, diphenhydrAMINE, guaiFENesin-dextromethorphan, ipratropium, melatonin, pantoprazole, polyethylene glycol, sodium phosphate  Assessment/Plan:  Persistent atrial fibrillation: Ventricular rate controlled. Rate control: Lopressor Rhythm control: Amiodarone Thromboembolic prophylaxis: Xarelto CHA2DS2-VASc SCORE is 7 which correlates to 9.6% risk of stroke per year (reduced LVEF, hypertension, age greater than 77, history of stroke, gender) Patient follows outpatient with Dr. [name] at [facility]. Review of patient's history reveals she is presently being treated with rate control strategy. Patient's rate remains well controlled during occupational therapy  Patient continues to have mild fatigue, however she states this is improving.  Prefer beta-clocker therapy to CCB given reduced LVEF.   Continue Lopressor 25 mg p.o. twice daily   Chronic heart failure with reduced EF, stage C, NYHA class II/III: Clinically euvolemic. Patient denies dyspnea or orthopnea.  Review of occupational therapy notes reveals patient blood pressure was soft during session.  Discontinue Lasix Continue to monitor daily weight and I&O. Notably I&O in chart is likely inaccurate   Prolonged QT: Patient has known history of prolonged QT. QT interval remains stable  Continue amiodarone 100 mg daily  Monitor potassium and magnesium, with goal of 4 and 2 respectively.  Continue to avoid QT prolonging agents   Long-term oral anticoagulation: Indication: Atrial fibrillation Tolerating  Xarelto without bleeding diathesis, will continue this.   Hypokalemia: Potassium goal of 4 Continue to monitor   Will defer management of other chronic comorbidities to primary team.   Patient was seen in collaboration with Dr. Terri Skains. He also reviewed patient's chart and examined the patient. Dr. Terri Skains is in agreement of the plan.    Alethia Berthold, PA-C 11/11/2020, 10:22 AM Office: [phone]  Dr. Terri Skains was unable to see patient at bedside, however discussed case and Dr. Terri Skains reviewed patient's chart. He was in agreement with the plan.    Alethia Berthold, PA-C 11/11/2020, 10:22 AM Office: [phone]

## 2020-11-11 NOTE — Discharge Instructions (Addendum)
Inpatient Rehab Discharge Instructions  Frances Kent Discharge date and time: 11/15/20   Activities/Precautions/ Functional Status: Activity: no lifting, driving, or strenuous exercise till cleared by MD.  NEEDS TO WEAR BINDER WHEN OUT UP/AMBULATING. FAMILY NEEDS TO HAVE HANDS ON PATIENT WHEN WALKING.   Diet: cardiac diet--soft foods/chopped meats. Needs to stay upright for 45-60 minutes after meals. Do not eat/drink for 3 hours before bedtime.  Wound Care: none needed   Functional status:  ___ No restrictions     ___ Walk up steps independently _X__ 24/7 supervision/assistance   ___ Walk up steps with assistance ___ Intermittent supervision/assistance  ___ Bathe/dress independently ___ Walk with walker     _X__ Bathe/dress with assistance ___ Walk Independently    ___ Shower independently _X__ Walk with assistance    ___ Shower with assistance ___ No alcohol     ___ Return to work/school ________   Special Instructions: WEAR SUPPORT STOCKING AND BINDER DURING THE DAY. LOOSEN BINDER WHEN SEATED. REMOVE BINDER IF IN BED.       COMMUNITY REFERRALS UPON DISCHARGE:    Home Health:   PT, OT, RN                Agency: [facility]    Phone: [phone]  OUTPATIENT PALLIATIVE CARE TO FOLLOW ALSO   Medical Equipment/Items Ordered:TRANSPORT CHAIR, 3 IN 1-DAUGHTER TO GET [place]                                                 Agency/Supplier:[facility]  [phone]     My questions have been answered and I understand these instructions. I will adhere to these goals and the provided educational materials after my discharge from the hospital.  Patient/Caregiver Signature _______________________________ Date __________  Clinician Signature _______________________________________ Date __________  Please bring this form and your medication list with you to all your follow-up doctor's appointments.

## 2020-11-11 NOTE — Progress Notes (Addendum)
Subjective:  Frances Kent seen and examined at bedside accompanied by daughter, Frances Kent.  Frances Kent reports continued fatigue which she feels has been stable over the last 2 days.  Yesterday Frances Kent walked 100' with minimal assistance and denied symptoms of orthostasis with PT.  This morning Frances Kent orthostatic.  Today's Vitals   11/11/20 0410 11/11/20 0413 11/11/20 0424 11/11/20 0747  BP: 101/62 (!) 84/63  (!) 84/58  Pulse: 65 94  86  Resp: 18 18  18   Temp:    98.4 F (36.9 C)  TempSrc:    Oral  SpO2: 98% 92%  99%  Weight:   41.1 kg   Height:      PainSc:    0-No pain   Body mass index is 16.05 kg/m.  Orthostatic vitals 11/11/2020: Without abdominal binder Supine: 103/63 mmHg, 89 bpm Sitting: 102/79 mmHg, 79 bpm Standing: 71/51 mmHg, 107 bpm  With abdominal binder Standing: 76/66 mmHg, 102 bpm  Physical Exam Vitals reviewed.  Constitutional:      General: She is not in acute distress. HENT:     Head: Normocephalic and atraumatic.  Neck:     Vascular: No carotid bruit.  Cardiovascular:     Rate and Rhythm: Normal rate. Rhythm irregularly irregular.     Pulses: Intact distal pulses.     Heart sounds: S1 normal and S2 normal. Murmur heard.  Holosystolic murmur is present with a grade of 2/6 at the apex.    No gallop.  Pulmonary:     Effort: Pulmonary effort is normal. No respiratory distress.     Breath sounds: No wheezing, rhonchi or rales.  Musculoskeletal:     Cervical back: Neck supple.     Right lower leg: No edema.     Left lower leg: No edema.  Skin:    General: Skin is warm and dry.  Neurological:     Mental Status: She is alert and oriented to person, place, and time.    Lab Results: BMP BNP (last 3 results) Recent Labs    10/30/20 0939 11/01/20 0532 11/06/20 1312  BNP 1,073.7* 418.5* 757.7*    ProBNP (last 3 results) No results for input(s): PROBNP in the last 8760 hours. BMP Latest Ref Rng & Units 11/11/2020 11/10/2020 11/08/2020  Glucose 70 - 99  mg/dL 92 90 125(H)  BUN 8 - 23 mg/dL 14 15 11   Creatinine 0.44 - 1.00 mg/dL 1.06(H) 1.04(H) 1.06(H)  Sodium 135 - 145 mmol/L 136 138 140  Potassium 3.5 - 5.1 mmol/L 4.8 4.6 4.6  Chloride 98 - 111 mmol/L 107 107 108  CO2 22 - 32 mmol/L 25 25 25   Calcium 8.9 - 10.3 mg/dL 8.4(L) 8.5(L) 8.6(L)   Hepatic Function Latest Ref Rng & Units 11/05/2020 11/01/2020 10/30/2020  Total Protein 6.5 - 8.1 g/dL 5.5(L) - 6.8  Albumin 3.5 - 5.0 g/dL 2.3(L) 2.6(L) 3.3(L)  AST 15 - 41 U/L 16 - 24  ALT 0 - 44 U/L 13 - 15  Alk Phosphatase 38 - 126 U/L 75 - 89  Total Bilirubin 0.3 - 1.2 mg/dL 0.7 - 1.8(H)  Bilirubin, Direct 0.0 - 0.2 mg/dL - - -   CBC Latest Ref Rng & Units 11/05/2020 11/04/2020 11/03/2020  WBC 4.0 - 10.5 K/uL 7.4 8.0 6.7  Hemoglobin 12.0 - 15.0 g/dL 9.0(L) 9.0(L) 9.2(L)  Hematocrit 36.0 - 46.0 % 28.6(L) 29.4(L) 29.5(L)  Platelets 150 - 400 K/uL 328 331 332   Lipid Panel     Component Value Date/Time   CHOL 144  11/02/2020 0509   TRIG 76 11/02/2020 0509   TRIG 72 11/02/2020 0509   HDL 48 11/02/2020 0509   CHOLHDL 3.0 11/02/2020 0509   VLDL 15 11/02/2020 0509   LDLCALC 81 11/02/2020 0509   LDLDIRECT 185.3 03/27/2013 1117   Cardiac Panel (last 3 results) No results for input(s): CKTOTAL, CKMB, TROPONINI, RELINDX in the last 72 hours.  HEMOGLOBIN A1C Lab Results  Component Value Date   HGBA1C 5.8 (H) 11/02/2020   MPG 119.76 11/02/2020   TSH Recent Labs    10/16/20 0659 10/30/20 0939 11/05/20 1848  TSH 0.880 2.300 2.451    Imaging: No results found.  Cardiac Studies:  EKG: 11/05/2020: Atrial fibrillation, 79 bpm, right bundle branch block, left anterior fascicular block, prolonged QT interval.  No significant change compared to prior ECG 11/07/2020: Atrial fibrillation with controlled ventricular response at a rate of 78 bpm.  Right bundle branch block, left anterior fascicular block.  Prolonged QT interval.  No significant change compared to previous EKG 11/06/2020.     Echocardiogram: 11/01/2020: LVEF 40-45%, indeterminate diastolic pattern, normal right ventricular size and function, left atrium moderately dilated, right atrium moderately dilated, moderate to severe MR, moderate TR, suggestive of RAP 3 mmHg   Scheduled Meds:  amiodarone  100 mg Oral Daily   calcium carbonate  1 tablet Oral BID WC   ezetimibe  10 mg Oral Daily   famotidine  20 mg Oral Daily   feeding supplement  237 mL Oral BID BM   levothyroxine  50 mcg Oral Q0600   megestrol  400 mg Oral BID   metoprolol tartrate  25 mg Oral BID   multivitamin with minerals  1 tablet Oral Daily   Rivaroxaban  15 mg Oral Q supper   Continuous Infusions: PRN Meds:.acetaminophen, alum & mag hydroxide-simeth, bisacodyl, diphenhydrAMINE, guaiFENesin-dextromethorphan, ipratropium, melatonin, pantoprazole, polyethylene glycol, sodium phosphate  Assessment/Plan:  Persistent atrial fibrillation: Ventricular rate controlled. Rate control: Lopressor Rhythm control: Amiodarone Thromboembolic prophylaxis: Xarelto CHA2DS2-VASc SCORE is 7 which correlates to 9.6% risk of stroke per year (reduced LVEF, hypertension, age greater than 50, history of stroke, gender) Outpatient cardiologist Dr. [name] at [facility] - review of Frances Kent's history reveals she is presently being treated with rate control strategy. Prefer beta-blocker therapy to calcium channel blocker given reduced LVEF. Frances Kent remains well rate controlled with Lopressor 25 mg p.o. twice daily.  However, due to symptomatic orthostasis will reduce the dose to Toprol-XL 25 mg p.o. daily starting tomorrow. Given soft BP will initiate gentle fluid resuscitation and encourage oral intake. Frances Kent's oral intake has been poor which is significantly contributing to soft blood pressures, overall strength, functional status.  Was discharged from rehab Frances Kent is encouraged to follow-up with her outpatient cardiologist to consider restoration of rhythm to see if her  functional status improves. Will defer further management to primary team.   Orthostatic hypotension: 12-hour trial of 0.9 normal saline at 75 cc an hour. Recommend close monitoring for symptoms of volume overload given her LVEF Will change Lopressor 25 mg p.o. twice daily to Toprol-XL 25 mg p.o. daily starting tomorrow Recommend changing positions slowly. Increasing oral intake of food and keeping herself well-hydrated. Compression stockings thigh-high especially when she is ambulating with physical therapy.  Chronic heart failure with reduced EF, stage C, NYHA class II: Clinically euvolemic. Continue to monitor daily weight and I&O. Notably I&O in chart is likely inaccurate   Prolonged QT: Frances Kent has known history of prolonged QT. QT interval remains stable  Continue amiodarone  100 mg daily  Monitor potassium and magnesium, with goal of 4 and 2 respectively.  Continue to avoid QT prolonging agents   Long-term oral anticoagulation: Indication: Atrial fibrillation Currently on Xarelto. Does not endorse any evidence of bleeding.  Will defer management of other chronic comorbidities to primary team.   Frances Kent was seen in collaboration with Dr. Terri Skains. He also reviewed Frances Kent's chart and examined the Frances Kent. Dr. Terri Skains is in agreement of the plan.    Alethia Berthold, PA-C 11/11/2020, 9:34 AM Office: [phone]   ADDENDUM:   Shared Visit: Frances Kent was independently interviewed and examined by me. This has been a shared visit with Lawerance Cruel, PA. I reviewed the above and agree the findings and recommendations, changes were make where needed as well.   Accompanied by both of her daughters at bedside. Denies any chest pain or heart failure symptoms. Currently appears to be more energetic after receiving IV fluids. Currently is not wearing her abdominal binder or lower extremity compressions  PHYSICAL EXAM: Vitals with BMI 11/11/2020 11/11/2020 11/11/2020  Height - - -   Weight - - 90 lbs 10 oz  BMI - - 74.82  Systolic 707 84 -  Diastolic 63 58 -  Pulse 94 86 -   CONSTITUTIONAL: Appearing older than stated age, hemodynamically stable, no acute distress.   SKIN: Skin is warm and dry. No rash noted. No cyanosis. No pallor. No jaundice HEAD: Normocephalic and atraumatic.  EYES: No scleral icterus MOUTH/THROAT: Dry oral membranes.  NECK: No JVD present. No thyromegaly noted. No carotid bruits  LYMPHATIC: No visible cervical adenopathy.  CHEST Normal respiratory effort. No intercostal retractions  LUNGS: Clear to auscultation bilaterally.  No stridor. No wheezes. No rales.  CARDIOVASCULAR: Irregularly irregular, variable E6-L5, soft holosystolic murmur heard at the apex, ABDOMINAL: Nonobese, soft, nontender, nondistended, positive bowel sounds in all 4 quadrants, no apparent ascites.  EXTREMITIES: No peripheral edema  HEMATOLOGIC: No significant bruising NEUROLOGIC: Oriented to person, place, and time. Nonfocal. Normal muscle tone.  PSYCHIATRIC: Normal mood and affect. Normal behavior. Cooperative    12 lead EKG was personally reviewed by me: Rate controlled atrial fibrillation   Assessment/Plan: Atrial fibrillation: Ventricular rate is well controlled. As discussed above we will reduce her metoprolol dose to 25 mg p.o. daily. Given her orthostatic hypotension this morning and metoprolol was held. I have discussed with both of the daughters that when she is discharged from rehab strongly encouraged follow-up with primary cardiologist and consider rhythm control strategy to see if her underlying level of energy improves. Continue amiodarone 100 mg p.o. daily Xarelto for thromboembolic prophylaxis See above.  Had a long discussion with the Frances Kent, both of her daughters including [name] with regards to disease progression, discussing medical therapy, and coordination of care.  Also spoke to Ms. Reesa Chew after rounds and updated her from cardiology  standpoint.    This note was created using a voice recognition software as a result there may be grammatical errors inadvertently enclosed that do not reflect the nature of this encounter. Every attempt is made to correct such errors.  Total time spent 38 minutes.   Rex Kras, [place], [facility]  Pager: [phone] Office: [phone]

## 2020-11-11 NOTE — Progress Notes (Signed)
Occupational Therapy Session Note  Patient Details  Name: Frances Kent MRN: [id number] Date of Birth: 1931-01-31  Today's Date: 11/11/2020 OT Individual Time: 1330-1430 OT Individual Time Calculation (min): 60 min    Short Term Goals: Week 1:  OT Short Term Goal 1 (Week 1): STE=LTG 2/2 ELOS  Skilled Therapeutic Interventions/Progress Updates:    Pt sitting EOB with daughter assisting to stand up and ambulate to bathroom.  Educated pts daughter on importance and benefits of using gait belt to assist pt during mobility.  Pt ambulated to toilet with assist for IV pole management and for balance and completed toilet transfer and clothing management with close supervision.  Pt sat on toilet with distant supervision however when pt done, she impulsively stood up and started ambulating to doorway with pants still pulled down and poor awareness of IV pole needing CGA and Vcs for balance and safety awareness.  Pt pulled up pants with close supervision and ambulated back to EOB.  Vitals assessed sitting EOB:  BP: 96/60 (abdominal binder and thigh high TEDs donned).  Donned ace wrap to thighs and vitals assessed again: 90/71.  Pt requesting to change pants.  Doffed pants with ankle cuff with min assist and donned legging pants with close supervision.  BP taken in supine: 104/66.  Call bell in reach, bed alarm on.  Therapy Documentation Precautions:  Precautions Precautions: Fall Precaution Comments: Macular degeneration Restrictions Weight Bearing Restrictions: No Other Position/Activity Restrictions: Monitor BP   Therapy/Group: Individual Therapy  Ezekiel Slocumb 11/11/2020, 2:47 PM

## 2020-11-11 NOTE — Progress Notes (Signed)
Physical Therapy Session Note  Patient Details  Name: Frances Kent MRN: [id number] Date of Birth: 02-Dec-1930  Today's Date: 11/11/2020 PT Individual Time: 0800-0848 PT Individual Time Calculation (min): 48 min  and Today's Date: 11/11/2020 PT Missed Time: 12 Minutes Missed Time Reason: Patient fatigue  Short Term Goals: Week 1:  PT Short Term Goal 1 (Week 1): STG = LTG due to ELOS  Skilled Therapeutic Interventions/Progress Updates:    Patient received sitting edge of bed with dtr present, agreeable to PT. She denies pain, but endorses fatigue. She reports not sleeping well. Dtr believes that BP rx may be contributing to level of fatigue as well. Patient ambulated to bathroom with Dtr assist. Continent of bladder. Dtr assisting with clothing management and perihygiene in sitting. Upon returning to bed. Patient laying supine to don thigh high TED hose with TotalA. BP in supine 103/63 (73) 89 BPM. Patient able to come sit edge of bed with supine. BP in sitting: 102/79 (86) 79 BPM. Cards in/out for assessment. BP in standing: 71/51 (60) 107BPM. No abdominal binder donned for first vitals assessment. Patients only complaint was of increasing fatigue. No light headed/dizzy. Abdominal binder donned. BP in standing: 76/66 (79) 102BPM. Patient complaining of same degree of fatigue as without abdominal binder. Declining to continue therapy, but okay with transferring to recliner to sit up this AM. CGA to transfer to recliner with no AD. Legs elevated, call light within reach, dtr at bedside.    Therapy Documentation Precautions:  Precautions Precautions: Fall Precaution Comments: Macular degeneration Restrictions Weight Bearing Restrictions: No Other Position/Activity Restrictions: Monitor BP   Therapy/Group: Individual Therapy  Karoline Caldwell, PT, DPT, CBIS  11/11/2020, 7:36 AM

## 2020-11-11 NOTE — Progress Notes (Signed)
Speech Language Pathology Daily Session Note  Patient Details  Name: CHLOE FLIS MRN: [id number] Date of Birth: December 27, 1930  Today's Date: 11/11/2020 SLP Individual Time: 1300-1330 SLP Individual Time Calculation (min): 30 min  Short Term Goals: Week 1: SLP Short Term Goal 1 (Week 1): Patient will demonstrate adequate awareness to errors and attempt self-correction when completing simulated mildly complex ADL tasks with supervision level A. SLP Short Term Goal 2 (Week 1): Patient will demonstrate ability to perform simulated medication management (pill box) organization and money management (check register) tasks with minA verbal and visual cues for accuracy. SLP Short Term Goal 3 (Week 1): Patient will maintain 100% speech intelligibility during conversations with SLP with intermittent verbal cues for speech strategies. (increase vocal intensity, overarticulate). SLP Short Term Goal 4 (Week 1): Patient will demonstrate adequate reasoning and awareness during simulated functional problem solving tasks, with minA verbal and visual cues. SLP Short Term Goal 5 (Week 1): Patient will name at least 10 items in a given category with minA verbal, semantic cues.  Skilled Therapeutic Interventions:   Patient seen with daughter present in room for skilled ST session. Patient was a little tired but per daughter report, she has been doing much better since starting megace for appetite stimulant. Patient participated in task of reviewing her medications and discussing reasoning for them. She was able to locate medications that she was previously taking at home and also noted a couple medications that she was not taking any longer. (Confirmed by daughter). When asked about her goals, patient said she would like to go to the bathroom and bathe herself. SLP then discussed reasoning of needing supervision, especially with recent low BP and patient in agreement. She benefited from [employer] verbal cues throughout.  Patient left in bed with daughter present in room.  Pain Pain Assessment Pain Scale: 0-10 Pain Score: 0-No pain  Therapy/Group: Individual Therapy  Sonia Baller, MA, CCC-SLP Speech Therapy

## 2020-11-12 DIAGNOSIS — I63232 Cerebral infarction due to unspecified occlusion or stenosis of left carotid arteries: Secondary | ICD-10-CM

## 2020-11-12 DIAGNOSIS — Z7189 Other specified counseling: Secondary | ICD-10-CM

## 2020-11-12 DIAGNOSIS — Z515 Encounter for palliative care: Secondary | ICD-10-CM

## 2020-11-12 LAB — CBC WITH DIFFERENTIAL/PLATELET
Abs Immature Granulocytes: 0.04 10*3/uL (ref 0.00–0.07)
Basophils Absolute: 0.1 10*3/uL (ref 0.0–0.1)
Basophils Relative: 1 %
Eosinophils Absolute: 0 10*3/uL (ref 0.0–0.5)
Eosinophils Relative: 0 %
HCT: 34.1 % — ABNORMAL LOW (ref 36.0–46.0)
Hemoglobin: 10.3 g/dL — ABNORMAL LOW (ref 12.0–15.0)
Immature Granulocytes: 1 %
Lymphocytes Relative: 19 %
Lymphs Abs: 1.4 10*3/uL (ref 0.7–4.0)
MCH: 26.9 pg (ref 26.0–34.0)
MCHC: 30.2 g/dL (ref 30.0–36.0)
MCV: 89 fL (ref 80.0–100.0)
Monocytes Absolute: 0.6 10*3/uL (ref 0.1–1.0)
Monocytes Relative: 7 %
Neutro Abs: 5.6 10*3/uL (ref 1.7–7.7)
Neutrophils Relative %: 72 %
Platelets: 429 10*3/uL — ABNORMAL HIGH (ref 150–400)
RBC: 3.83 MIL/uL — ABNORMAL LOW (ref 3.87–5.11)
RDW: 17.3 % — ABNORMAL HIGH (ref 11.5–15.5)
WBC: 7.8 10*3/uL (ref 4.0–10.5)
nRBC: 0 % (ref 0.0–0.2)

## 2020-11-12 LAB — BASIC METABOLIC PANEL
Anion gap: 6 (ref 5–15)
BUN: 11 mg/dL (ref 8–23)
CO2: 23 mmol/L (ref 22–32)
Calcium: 8.7 mg/dL — ABNORMAL LOW (ref 8.9–10.3)
Chloride: 109 mmol/L (ref 98–111)
Creatinine, Ser: 0.9 mg/dL (ref 0.44–1.00)
GFR, Estimated: >60 mL/min (ref >60)
Glucose, Bld: 98 mg/dL (ref 70–99)
Potassium: 4.8 mmol/L (ref 3.5–5.1)
Sodium: 138 mmol/L (ref 135–145)

## 2020-11-12 NOTE — Progress Notes (Addendum)
Subjective:  Patient seen and examined at approximately 6:00 PM. [name] and son are present at bedside. Patient ambulating in her room upon my arrival.   Denies dizziness, dyspnea Patient continues to complain of fatigue, however reports it is significantly improved today compared to yesterday. [name] reports the patient is eating better today and has increased her fluid intake relatively speaking. Patient's blood pressure remains soft, but stable and able to complete therapy today. Heart rate remains relatively well controlled.  Today's Vitals   11/12/20 1038 11/12/20 1306 11/12/20 1351 11/12/20 1810  BP: 112/76 (!) 98/55 (!) 93/56 112/82  Pulse: 93 99 98 98  Resp:  _0 Temp:  97.8 F (36.6 C)  98.5 F (36.9 C)  TempSrc:  Oral  Oral  SpO2:  100% 98% 99%  Weight:      Height:      PainSc:       Body mass index is 17.53 kg/m.  Physical Exam Vitals reviewed.  Constitutional:      General: She is not in acute distress.    Comments: Ambulating in room upon my arrival without assistance.  HENT:     Head: Normocephalic and atraumatic.  Neck:     Vascular: No carotid bruit.  Cardiovascular:     Rate and Rhythm: Normal rate. Rhythm irregularly irregular.     Pulses: Intact distal pulses.     Heart sounds: S1 normal and S2 normal. Murmur heard.  Holosystolic murmur is present with a grade of 2/6 at the apex.    No gallop.  Pulmonary:     Effort: Pulmonary effort is normal. No respiratory distress.     Breath sounds: No wheezing, rhonchi or rales.  Musculoskeletal:     Cervical back: Neck supple.     Right lower leg: No edema.     Left lower leg: No edema.  Skin:    General: Skin is warm and dry.  Neurological:     Mental Status: She is alert and oriented to person, place, and time.    Lab Results: BMP BNP (last 3 results) Recent Labs    10/30/20 0939 11/01/20 0532 11/06/20 1312  BNP 1,073.7* 418.5* 757.7*   ProBNP (last 3  results) No results for input(s): PROBNP in the last 8760 hours. BMP Latest Ref Rng & Units 11/12/2020 11/11/2020 11/10/2020  Glucose 70 - 99 mg/dL 98 92 90  BUN 8 - 23 mg/dL _1 Creatinine 0.44 - 1.00 mg/dL 0.90 1.06(H) 1.04(H)  Sodium 135 - 145 mmol/L 138 136 138  Potassium 3.5 - 5.1 mmol/L 4.8 4.8 4.6  Chloride 98 - 111 mmol/L 109 107 107  CO2 22 - 32 mmol/L _2 Calcium 8.9 - 10.3 mg/dL 8.7(L) 8.4(L) 8.5(L)   Hepatic Function Latest Ref Rng & Units 11/05/2020 11/01/2020 10/30/2020  Total Protein 6.5 - 8.1 g/dL 5.5(L) - 6.8  Albumin 3.5 - 5.0 g/dL 2.3(L) 2.6(L) 3.3(L)  AST 15 - 41 U/L 16 - 24  ALT 0 - 44 U/L 13 - 15  Alk Phosphatase 38 - 126 U/L 75 - 89  Total Bilirubin 0.3 - 1.2 mg/dL 0.7 - 1.8(H)  Bilirubin, Direct 0.0 - 0.2 mg/dL - - -   CBC Latest Ref Rng & Units 11/12/2020 11/05/2020 11/04/2020  WBC 4.0 - 10.5 K/uL 7.8 7.4 8.0  Hemoglobin 12.0 - 15.0 g/dL 10.3(L) 9.0(L) 9.0(L)  Hematocrit 36.0 - 46.0 % 34.1(L) 28.6(L) 29.4(L)  Platelets 150 - 400 K/uL 429(H)  328 331   Lipid Panel     Component Value Date/Time   CHOL 144 11/02/2020 0509   TRIG 76 11/02/2020 0509   TRIG 72 11/02/2020 0509   HDL 48 11/02/2020 0509   CHOLHDL 3.0 11/02/2020 0509   VLDL 15 11/02/2020 0509   LDLCALC 81 11/02/2020 0509   LDLDIRECT 185.3 03/27/2013 1117   Cardiac Panel (last 3 results) No results for input(s): CKTOTAL, CKMB, TROPONINI, RELINDX in the last 72 hours.  HEMOGLOBIN A1C Lab Results  Component Value Date   HGBA1C 5.8 (H) 11/02/2020   MPG 119.76 11/02/2020   TSH Recent Labs    10/16/20 0659 10/30/20 0939 11/05/20 1848  TSH 0.880 2.300 2.451   Imaging: No results found.  Cardiac Studies:  EKG: 11/05/2020: Atrial fibrillation, 79 bpm, right bundle branch block, left anterior fascicular block, prolonged QT interval.  No significant change compared to prior ECG 11/07/2020: Atrial fibrillation with controlled ventricular response at a rate of 78 bpm.  Right bundle  branch block, left anterior fascicular block.  Prolonged QT interval.  No significant change compared to previous EKG 11/06/2020.    Echocardiogram: 11/01/2020: LVEF 40-45%, indeterminate diastolic pattern, normal right ventricular size and function, left atrium moderately dilated, right atrium moderately dilated, moderate to severe MR, moderate TR, suggestive of RAP 3 mmHg   Scheduled Meds:  amiodarone  100 mg Oral Daily   calcium carbonate  1 tablet Oral BID WC   ezetimibe  10 mg Oral Daily   famotidine  20 mg Oral Daily   feeding supplement  1 Container Oral TID WC   feeding supplement  237 mL Oral BID BM   levothyroxine  50 mcg Oral Q0600   megestrol  400 mg Oral BID   metoprolol succinate  25 mg Oral q morning   multivitamin with minerals  1 tablet Oral Daily   Rivaroxaban  15 mg Oral Q supper   Continuous Infusions: PRN Meds:.acetaminophen, alum & mag hydroxide-simeth, bisacodyl, diphenhydrAMINE, guaiFENesin-dextromethorphan, ipratropium, melatonin, pantoprazole, polyethylene glycol, sodium phosphate  Assessment/Plan:  Persistent atrial fibrillation: Ventricular rate controlled. Rate control: Lopressor Rhythm control: Amiodarone Thromboembolic prophylaxis: Xarelto CHA2DS2-VASc SCORE is 7 which correlates to 9.6% risk of stroke per year (reduced LVEF, hypertension, age greater than 27, history of stroke, gender) Outpatient cardiologist Dr. [name] at [facility] - review of patient's history reveals she is presently being treated with rate control strategy. Prefer beta-blocker therapy to calcium channel blocker given reduced LVEF. Tolerating Toprol-XL 25 mg once daily well  Blood pressure remains soft but stable and she is been able to complete physical therapy and Occupational Therapy. Soft blood pressure as well as decreased strength and overall functional status is related to poor oral intake. Continue to encourage oral intake and hydration. Patient will follow-up with her  outpatient cardiologist upon discharge from rehab.  Orthostatic hypotension: Patient improved with 12-hour trial of normal saline infusion, which is consistent with poor oral intake is significant contributor to patient's orthostasis and fatigue. Patient remains clinically euvolemic She is tolerating transition to Toprol-XL, continue this. Recommend compression stockings when patient is ambulating as well as conservative measures including changing positions slowly. Encourage oral hydration   Chronic heart failure with reduced EF, stage C, NYHA class II: Clinically euvolemic  Continue to monitor daily weight and I&O. Notably I&O in chart is likely inaccurate   Prolonged QT: Patient has known history of prolonged QT. QT interval remains stable  Continue amiodarone 100 mg daily  Potassium and magnesium call of 4  and 2 respectively.  Continue to avoid QT prolonging agents   Long-term oral anticoagulation: Indication: Atrial fibrillation Currently on Xarelto. No evidence of bleeding diathesis.  Will defer management of other chronic comorbidities to primary team.   Patient was seen in collaboration with Dr. Terri Skains. He also reviewed patient's chart and examined the patient. Dr. Terri Skains is in agreement of the plan.    Alethia Berthold, PA-C 11/12/2020, 6:12 PM Office: [phone]  ADDENDUM:   Shared Visit: Patient was independently interviewed and examined by me.  No family at bedside No chest pain or heart failure symptoms.     PHYSICAL EXAM: Vitals with BMI 11/12/2020 11/12/2020 11/12/2020  Height - - -  Weight - - -  BMI - - -  Systolic 159 539 93  Diastolic 85 82 56  Pulse 672 98 98   CONSTITUTIONAL: Appears older than stated age, well-developed and well-nourished. No acute distress.  SKIN: Skin is warm and dry. No rash noted. No cyanosis. No pallor. No jaundice HEAD: Normocephalic and atraumatic.  EYES: No scleral icterus MOUTH/THROAT: Moist oral membranes.  NECK: No  JVD present. No thyromegaly noted. No carotid bruits  LYMPHATIC: No visible cervical adenopathy.  CHEST Normal respiratory effort. No intercostal retractions  LUNGS: Clear to auscultation bilaterally.  No stridor. No wheezes. No rales.  CARDIOVASCULAR: Irregularly irregular, variable W9-T9, soft systolic murmur heard at the apex, no gallops or rubs ABDOMINAL: Nonobese, soft, nontender, nondistended : No apparent ascites.  EXTREMITIES: No peripheral edema  HEMATOLOGIC: No significant bruising NEUROLOGIC: Oriented to person, place, and time. Nonfocal. Normal muscle tone.  PSYCHIATRIC: Normal mood and affect. Normal behavior. Cooperative    Assessment/Plan: Atrial fibrillation: Persistent. Rate control: Toprol-XL. Rhythm control N/A. Thromboembolic prophylaxis: Xarelto CHA2DS2-VASc SCORE is 7 which correlates to 9.6% risk of stroke per year (reduced LVEF, hypertension, age greater than 30, history of stroke, gender) Outpatient cardiologist Dr. [name] at [facility] - review of patient's history reveals she is presently being treated with rate control strategy. Recommend longitudinal care w/ her established cardiologist   Cariology will sign off for now please reach out if questions or concerns arise.   Further recommendations to follow as the case evolves.   This note was created using a voice recognition software as a result there may be grammatical errors inadvertently enclosed that do not reflect the nature of this encounter. Every attempt is made to correct such errors.   Rex Kras, [place], [facility]  Pager: [phone] Office: [phone]

## 2020-11-12 NOTE — Progress Notes (Signed)
Patient ID: Frances Kent, female   DOB: March 15, 1930, 87 y.o.   MRN: [id number] Met with pt and daughter to answer her questions regarding ALF in [place] area. Have given daughter and list of them. Pt reports feeling a little better today. Daughter asking to talk with MD continues to be concerned about BP issues and medical readiness for discharge Sunday.

## 2020-11-12 NOTE — Progress Notes (Signed)
PROGRESS NOTE   Subjective/Complaints: Continues to be hypotensive during therapy but keeps pushing through. Discussed with patient and daughter that cardiology wishes to maintain her lopressor  ROS: Patient denies CP, SOB,  N/V/D, +fatigue, +dizziness with standing  Objective:   No results found. Recent Labs    11/12/20 0504  WBC 7.8  HGB 10.3*  HCT 34.1*  PLT 429*    Recent Labs    11/11/20 0547 11/12/20 0504  NA 136 138  K 4.8 4.8  CL 107 109  CO2 25 23  GLUCOSE 92 98  BUN 14 11  CREATININE 1.06* 0.90  CALCIUM 8.4* 8.7*    Intake/Output Summary (Last 24 hours) at 11/12/2020 1354 Last data filed at 11/12/2020 1305 Gross per 24 hour  Intake 1104.84 ml  Output --  Net 1104.84 ml        Physical Exam: Vital Signs Blood pressure (!) 93/56, pulse 98, temperature 97.8 F (36.6 C), temperature source Oral, resp. rate 20, height 5\' 3"  (1.6 m), weight 44.9 kg, SpO2 98 %. Gen: no distress, normal appearing HEENT: oral mucosa pink and moist, NCAT Cardio: Reg rate Chest: normal effort, normal rate of breathing Abd: soft, non-distended Ext: no edema Psych: pleasant, normal affect   Skin:    General: Skin is warm and dry. IV in place Neurological:     Mental Status: She is alert.     Comments: Alert and oriented x2 Motor: RUE/RLE: 4+/5 proximal distal LUE/LE: 4/5 proximal to distal      Assessment/Plan: 1. Functional deficits which require 3+ hours per day of interdisciplinary therapy in a comprehensive inpatient rehab setting. Physiatrist is providing close team supervision and 24 hour management of active medical problems listed below. Physiatrist and rehab team continue to assess barriers to discharge/monitor patient progress toward functional and medical goals  Care Tool:  Bathing    Body parts bathed by patient: Right arm, Left arm, Chest, Abdomen, Front perineal area, Right upper leg, Left  upper leg, Face   Body parts bathed by helper: Buttocks, Left lower leg, Right lower leg     Bathing assist Assist Level: Minimal Assistance - Patient > 75%     Upper Body Dressing/Undressing Upper body dressing   What is the patient wearing?: Pull over shirt    Upper body assist Assist Level: Set up assist    Lower Body Dressing/Undressing Lower body dressing      What is the patient wearing?: Pants, Incontinence brief     Lower body assist Assist for lower body dressing: Minimal Assistance - Patient > 75%     Toileting Toileting    Toileting assist Assist for toileting: Contact Guard/Touching assist     Transfers Chair/bed transfer  Transfers assist     Chair/bed transfer assist level: Contact Guard/Touching assist     Locomotion Ambulation   Ambulation assist      Assist level: Minimal Assistance - Patient > 75% Assistive device: No Device Max distance: 150   Walk 10 feet activity   Assist     Assist level: Minimal Assistance - Patient > 75% Assistive device: No Device   Walk 50 feet activity   Assist  Assist level: Minimal Assistance - Patient > 75% Assistive device: No Device    Walk 150 feet activity   Assist    Assist level: Contact Guard/Touching assist Assistive device: No Device    Walk 10 feet on uneven surface  activity   Assist     Assist level: Contact Guard/Touching assist     Wheelchair     Assist Is the patient using a wheelchair?: No             Wheelchair 50 feet with 2 turns activity    Assist            Wheelchair 150 feet activity     Assist          Blood pressure (!) 93/56, pulse 98, temperature 97.8 F (36.6 C), temperature source Oral, resp. rate 20, height 5\' 3"  (1.6 m), weight 44.9 kg, SpO2 98 %.    Medical Problem List and Plan: 1.  Weakness, balance deficits with posterior lean, as well as SOB/tachycardia with activity secondary to left ICA occlusion.              -patient may shower             -ELOS/Goals: 10 days/supervision             -Continue CIR therapies including PT, OT, and SLP  2.  H/o DVT/A fib/Antithrombotics: -DVT/anticoagulation:  Pharmaceutical: Xarelto             -antiplatelet therapy: N/A 3. Pain Management: N/A 4. Mood/anxiety: LCSW to follow for evaluation and support.              -antipsychotic agents: N/A  D/c Xanax given hypotension 5. Neuropsych: This patient is not fully capable of making decisions on her own behalf. 6. Skin/Wound Care: Routine pressure relief measures.  7. Fluids/Electrolytes/Nutrition: Monitor I/Os 8. Afib with RVR: Monitor HR TID--continue cardizem, amiodarone and Xarelto.             --Monitor heart rate with increased exertion. Has had malaise/poor intake X weeks.              --EKG with prolonged Qtc- discussed with her outpatient cardiologist.  --Has pending eval with EP for PPM. SSS?   -9/24 suspect sob was anxiety driven. EKG ordered and is pending  -demonstrates orthostasis on AM  VS  -cardiology actively following 9. HTN: Monitor BP TID--continues to have intermittent hypotension with SBP in 90's             -orthostatic as above Vitals:   11/12/20 1306 11/12/20 1351  BP: (!) 98/55 (!) 93/56  Pulse: 99 98  Resp: 18 20  Temp: 97.8 F (36.6 C)   SpO2: 100% 98%    10. CAP: Completed course of ceftriaxone, discussed with patient and daughter.  11. Hyperlipidemia: Continue Zetia due to intolerance of statins.  12.  AKI: resolved. Continue to hold Lasix given hypotension 13. Acute on chronic systolic CHF: Compensated--liberalized her diet given her lack of appetite..  9/24-weights appear stable, BNP 700+ -check cxr today -defer volume mgt to cards  -continue to hold lasix given hypotension Filed Weights   11/10/20 0500 11/11/20 0424 11/12/20 0500  Weight: 40.1 kg 41.1 kg 44.9 kg     14. Hypocalcemia: Will add tums for supplement. 15. H/o esophageal stricture/GERD: Off PPI due to  prolonged QTC--will replace with pepcid to prevent symptoms --continue reflux precautions.    16. Insomnia: Cardizem dosing changed so she is not woken  at midnight.    -continue melatonin 17.  Poor appetite place on D3 and start megace, improved.  18. AKI: continue holding Lasix today, discussed with daughter, repeat creatinine tomorrow.  10. Fatigue: lopressor dose decreased by cardiology, recommended NAC supplementation. Will consult cardiology regarding whether Lopressor can be further decreased  11. Bradycardia: continue to keep lasix off.  12. Hypotension: asked cardiology if lopressor can be further decreased, but they would like to maintain given her afib  LOS: 8 days A FACE TO [place] 11/12/2020, 1:54 PM

## 2020-11-12 NOTE — Progress Notes (Signed)
Zacarias Pontes [phone] Manufacturing engineer [facility]) Hospital Liaison note:  Notified by Kathie Rhodes, NP, of request for [place] services. Will continue to follow for disposition.  Please call with any outpatient palliative questions or concerns.  Thank you for the opportunity to participate in this patient's care.  Thank you, Lorelee Market, LPN [facility] Liaison [phone]

## 2020-11-12 NOTE — Progress Notes (Signed)
Physical Therapy Session Note  Patient Details  Name: Frances Kent MRN: [id number] Date of Birth: [birth year]  Today's Date: 11/12/2020 PT Individual Time: 1020-1100 PT Individual Time Calculation (min): 40 min   Short Term Goals: Week 1:  PT Short Term Goal 1 (Week 1): STG = LTG due to ELOS  Skilled Therapeutic Interventions/Progress Updates: Pt presented in recliner agreeable to therapy. Pt denies pain during session. Pt's BP taken prior to activity 112/76 (89) HR 90 (taken manually due to a-fib). Pt participated in seated therex prior to ambulation. Pt participated in ankle pumps, LAQ, seated march, hip ER with green theraband, hamstring pulls with red theraband, and pillow squeezes x 15 bilaterally with intermittent rests due to fatigue. BP taken 94/56 (68) with pt asymptomatic. Pt then participated in ambulation ~16ft with RW and CGA overall. Pt did require min cues for safety with RW. After seated rest pt then participated in standing hip abd x 15 bilaterally and Sit to stand x 5 without AD. Pt then indicated some dizziness which resolved with seated rest.  BP taken 96/69 (77). Pt left in recliner at end of session with call bell within reach and dgt [name] present.      Therapy Documentation Precautions:  Precautions Precautions: Fall Precaution Comments: Macular degeneration Restrictions Weight Bearing Restrictions: No Other Position/Activity Restrictions: Monitor BP General:   Vital Signs: Therapy Vitals Temp: 97.8 F (36.6 C) Temp Source: Oral Pulse Rate: 98 Resp: 20 BP: (!) 93/56 Patient Position (if appropriate): Sitting Oxygen Therapy SpO2: 98 % O2 Device: Room Air   Therapy/Group: Individual Therapy  [name] 11/12/2020, 4:26 PM

## 2020-11-12 NOTE — Progress Notes (Signed)
Nutrition Follow-up  DOCUMENTATION CODES:   Severe malnutrition in context of chronic illness, Underweight  INTERVENTION:  Continue Ensure Enlive po BID, each supplement provides 350 kcal and 20 grams of protein  Continue Boost Breeze po TID, each supplement provides 250 kcal and 9 grams of protein  Encourage adequate PO intake.   NUTRITION DIAGNOSIS:   Severe Malnutrition related to chronic illness (CHF) as evidenced by severe fat depletion, severe muscle depletion; ongoing  GOAL:   Patient will meet greater than or equal to 90% of their needs; progressing  MONITOR:   PO intake, Supplement acceptance, Labs, Weight trends, I & O's  REASON FOR ASSESSMENT:   Malnutrition Screening Tool    ASSESSMENT:   87 yo female with a PMH of CHF, A-fib with RVR, PE, HTN, anxiety, GERD, and hypothyroidism who was admitted with acute on chronic CHF exacerbation and LLL PNA. Now presents to rehab.  Meal completion has been 75-100%. Family at bedside has been encouraging PO and bringing in food from home/outside that pt prefers to eat. Family reports pt with chewing difficulties with tough meats or hard food items. RD offered to downgrade diet to dysphagia 3, however family refused. Pt currently has Ensure and Boost Breeze ordered and has been consuming them. RD to continue with current orders.   NUTRITION - FOCUSED PHYSICAL EXAM:  Flowsheet Row Most Recent Value  Orbital Region Moderate depletion  Upper Arm Region Severe depletion  Thoracic and Lumbar Region Severe depletion  Buccal Region Moderate depletion  Temple Region Severe depletion  Clavicle Bone Region Severe depletion  Clavicle and Acromion Bone Region Severe depletion  Scapular Bone Region Unable to assess  Dorsal Hand Unable to assess  Patellar Region Unable to assess  Anterior Thigh Region Unable to assess  Posterior Calf Region Unable to assess  Edema (RD Assessment) None  Hair Reviewed  Eyes Reviewed  Mouth Reviewed   Skin Reviewed  Nails Reviewed      Labs and medications reviewed.   Diet Order:   Diet Order             Diet regular Room service appropriate? Yes; Fluid consistency: Thin  Diet effective now                   EDUCATION NEEDS:   No education needs have been identified at this time  Skin:  Skin Assessment: Reviewed RN Assessment  Last BM:  9/28  Height:   Ht Readings from Last 1 Encounters:  11/04/20 5\' 3"  (1.6 m)    Weight:   Wt Readings from Last 1 Encounters:  11/12/20 44.9 kg    BMI:  Body mass index is 17.53 kg/m.  Estimated Nutritional Needs:   Kcal:  1500-1700  Protein:  70-85 grams  Fluid:  >1.5 L  Corrin Parker, MS, RD, LDN RD pager number/after hours weekend pager number on Amion.

## 2020-11-12 NOTE — Progress Notes (Signed)
Occupational Therapy Session Note  Patient Details  Name: Frances Kent MRN: [id number] Date of Birth: 1930/08/20  Today's Date: 11/12/2020 OT Individual Time: 4696-2952 OT Individual Time Calculation (min): 60 min  and Today's Date: 11/12/2020 OT Missed Time: 15 Minutes Missed Time Reason: Patient fatigue  Short Term Goals: Week 1:  OT Short Term Goal 1 (Week 1): STE=LTG 2/2 ELOS  Skilled Therapeutic Interventions/Progress Updates:    Pt greeted supine in bed with daughter present. Pt's BP continues to be soft. OT raised HOB and took BP with HOB elevated 87/65. Pt then came to sitting EOB and TED hose and abdominal binder donned with BP 90/69. Pt completed stand-pivot to recliner with close supervision, but maintained standing for BP 83/41 standing. Pt took seated rest break and performed LB there-ex with 2 sets of 10 seated hip extension and knee extension. 2 sets of 5 mini squats in standing. Pt then reported need to go to the bathroom and ambulated with CGA. CGA for clothing management and pt able to void bowel and bladder.Pt performed peri-care with supervision. Addressed standing balance/endurance with standing toothbrushing and handwashing task at the sink. Pt able to maintain standing without increased dizziness. Pt reports she can usually tell when she needs to sit. Pt completed 3 sets of 10 bicep curl, seated row, and chest press before reaching max fatigue. Pt missed 15 minutes of OT treatment session 2/2 fatigue. Pt left seated in recliner with daughter present and needs met.   Therapy Documentation Precautions:  Precautions Precautions: Fall Precaution Comments: Macular degeneration Restrictions Weight Bearing Restrictions: No Other Position/Activity Restrictions: Monitor BP General: General OT Amount of Missed Time: 15 Minutes Pain:  Denies pain   Therapy/Group: Individual Therapy  Valma Cava 11/12/2020, 9:42 AM

## 2020-11-12 NOTE — Progress Notes (Signed)
Speech Language Pathology Daily Session Note  Patient Details  Name: Frances Kent MRN: [id number] Date of Birth: 05/26/1930  Today's Date: 11/12/2020 SLP Individual Time: 3159-4585 SLP Individual Time Calculation (min): 40 min  Short Term Goals: Week 1: SLP Short Term Goal 1 (Week 1): Patient will demonstrate adequate awareness to errors and attempt self-correction when completing simulated mildly complex ADL tasks with supervision level A. SLP Short Term Goal 2 (Week 1): Patient will demonstrate ability to perform simulated medication management (pill box) organization and money management (check register) tasks with minA verbal and visual cues for accuracy. SLP Short Term Goal 3 (Week 1): Patient will maintain 100% speech intelligibility during conversations with SLP with intermittent verbal cues for speech strategies. (increase vocal intensity, overarticulate). SLP Short Term Goal 4 (Week 1): Patient will demonstrate adequate reasoning and awareness during simulated functional problem solving tasks, with minA verbal and visual cues. SLP Short Term Goal 5 (Week 1): Patient will name at least 10 items in a given category with minA verbal, semantic cues.  Skilled Therapeutic Interventions:   Patient seen with daughter present in room to address cognitive function goals. Patient sitting in recliner and was awake and very alert. Daughter reported that patient has been eating a lot today and she feels she has had much more energy. SLP focused session on review of medications, with patient tasked with reading medication names to SLP and scanning over to read how many times to take, dose and what medication is for. She required minA verbal and visual cues overall with suspected influence from difficulty reading print (handwritten). (glasses in place). Patient oriented to month and year, day of week but stated date as 22nd. SLP provided a calendar for September and October. Plan still is for discharge  home with family 10/1 if patient's BP continues to be stable. Daughter in agreement when SLP reporting he is not recommending SLP services upon discharge. Patient left with daughter in room, in recliner with all needs in reach. She continues to benefit from skilled SLP intervention to maximize cognitive-linguistic function prior to discharge.  Pain Pain Assessment Pain Scale: 0-10 Faces Pain Scale: No hurt  Therapy/Group: Individual Therapy  Sonia Baller, MA, CCC-SLP Speech Therapy

## 2020-11-12 NOTE — Consult Note (Signed)
Consultation Note Date: 11/12/2020   Patient Name: Frances Kent  DOB: 12-14-1930  MRN: [id number]  Age / Sex: 87 y.o., female  PCP: [name], MD Referring Physician: Izora Ribas, MD  Reason for Consultation: Establishing goals of care  HPI/Patient Profile: 87 y.o. female  with past medical history of HTN, anxiety, PE and a fib on Xarelto, SVT, MVR, esophageal stricture s/p dilatation 10/21, and Covid 1/22 w/ chronic cough and malaise admitted on 11/04/2020  to CIR following admission 9/18-9/21 for fatigue, worsening of SOB, and palpitations. She was treated for fluid overload, started on IV antibiotics due to concerns of left lower lobe pneumonia and Cardizem for rate control. On 9/18 developed CVA - felt to be embolic d/t a fib. During time in CIR patient has struggled with fatigue and hypotension. PMT consulted per family request.   Clinical Assessment and Goals of Care: I have reviewed medical records including EPIC notes, labs and imaging, received report from RN, assessed the patient and then met with patient and her daughter [name]  to discuss diagnosis prognosis, GOC, EOL wishes, disposition and options.  I introduced Palliative Medicine as specialized medical care for people living with serious illness. It focuses on providing relief from the symptoms and stress of a serious illness. The goal is to improve quality of life for both the patient and the family.  [name] reports concerns about patient's planned discharge on Sunday. She is concerned about patient's cardiac medications and ongoing hypotension. We discuss that rehab team is aware of these concerns and adjusting medications.   [name] shares she does feel that patient is progressing - appetite has improved and function has improved some. [name] is hopeful for more improvement in function prior to discharge.    We discussed patient's current illness and what it means in the larger  context of patient's on-going co-morbidities.   I attempted to elicit values and goals of care important to the patient.  Improvement in functional status and ability to return home is most important to patient.   [name] asks about resources at home - we discuss hiring private caregiver vs home health vs outpatient palliative vs hospice (goals not at all in line with hospice care and we discussed that this was not appropriate at this time). [name] is interested in outpatient palliative referral and understands type of care provided .  Attempted to discuss code status - daughter requests that I not bring that up as it is too sad to talk about but does confirm that partial code (no CPR) accurately reflects patient's wishes. Tells me DNR was considered but they are no longer interested in that.   Discussed with patient/family the importance of continued conversation with family and the medical providers regarding overall plan of care and treatment options, ensuring decisions are within the context of the patient's values and GOCs.    Daughter asking for information about ALF if needed - will request CSW check in with them.  Questions and concerns were addressed. The family was encouraged to call with questions or concerns.   Primary Decision Maker Patient joined by [place]   - outpatient palliative referral - asked CSW to discuss resources with daughter - partial code (no CPR) - daughter not interested in discussing [place] further - will not plan to follow up - please call for additional needs      Primary Diagnoses: Present on Admission: . Left basal ganglia embolic stroke ([place])   I  have reviewed the medical record, interviewed the patient and family, and examined the patient. The following aspects are pertinent.  Past Medical History:  Diagnosis Date  . Atrial fibrillation ([place])   . Benign breast cyst in female, left 10/07/2016  . Colon adenomas   . GERD  (gastroesophageal reflux disease)   . Hypertension   . Hypothyroidism   . Macular degeneration   . Mitral regurgitation   . Pulmonary embolism ([place]) 02/2016  . SVT (supraventricular tachycardia) ([facility])    Social History   Socioeconomic History  . Marital status: Widowed    Spouse name: Not on file  . Number of children: Not on file  . Years of education: Not on file  . Highest education level: Not on file  Occupational History  . Not on file  Tobacco Use  . Smoking status: Never  . Smokeless tobacco: Never  . Tobacco comments:    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 , worked at [employer], Health visitor  . Vaping Use: Never used  Substance and Sexual Activity  . Alcohol use: No  . Drug use: No  . Sexual activity: Not Currently  Other Topics Concern  . Not on file  Social History Narrative   Has caretaking responsibility for 3 young grandchildren who live with her   Social Determinants of Health   Financial Resource Strain: Not on file  Food Insecurity: Not on file  Transportation Needs: Not on file  Physical Activity: Not on file  Stress: Not on file  Social Connections: Not on file   Family History  Problem Relation Age of Onset  . Cancer Mother 40       breast cancer, lived to 32,   . Breast cancer Mother 16  . Cancer Son 79       pancreatic cancer  . Heart disease Son        CAD, Tobacco Abuse   . Heart disease Maternal Grandmother 57       died of massive MI  . Stroke Sister    Scheduled Meds: . amiodarone  100 mg Oral Daily  . calcium carbonate  1 tablet Oral BID WC  . ezetimibe  10 mg Oral Daily  . famotidine  20 mg Oral Daily  . feeding supplement  1 Container Oral TID WC  . feeding supplement  237 mL Oral BID BM  . levothyroxine  50 mcg Oral Q0600  . megestrol  400 mg Oral BID  . metoprolol succinate  25 mg Oral q morning  . multivitamin with minerals  1 tablet Oral Daily  . Rivaroxaban  15 mg Oral Q supper   Continuous Infusions: PRN  Meds:.acetaminophen, alum & mag hydroxide-simeth, bisacodyl, diphenhydrAMINE, guaiFENesin-dextromethorphan, ipratropium, melatonin, pantoprazole, polyethylene glycol, sodium phosphate Allergies  Allergen Reactions  . Statins     Severe myalgias   Review of Systems  Constitutional:  Positive for activity change and fatigue.  Neurological:  Positive for syncope, speech difficulty and weakness.   Physical Exam Constitutional:      General: She is not in acute distress. Pulmonary:     Effort: Pulmonary effort is normal.  Skin:    General: Skin is warm and dry.  Neurological:     Mental Status: She is alert.    Vital Signs: BP 112/76   Pulse 93   Temp 98.1 F (36.7 C) (Oral)   Resp 18   Ht _0  (1.6 m)   Wt 44.9 kg   SpO2 100%   BMI  17.53 kg/m  Pain Scale: 0-10   Pain Score: 0-No pain   SpO2: SpO2: 100 % O2 Device:SpO2: 100 % O2 Flow Rate: .   IO: Intake/output summary:  Intake/Output Summary (Last 24 hours) at 11/12/2020 1131 Last data filed at 11/12/2020 0740 Gross per 24 hour  Intake 864.84 ml  Output --  Net 864.84 ml    LBM: Last BM Date: 11/11/20 Baseline Weight: Weight: 45.1 kg Most recent weight: Weight: 44.9 kg     Palliative Assessment/Data: PPS 50%    Time Total: 60 minutes Greater than 50%  of this time was spent counseling and coordinating care related to the above assessment and plan.  Juel Burrow, DNP, AGNP-C Palliative Medicine Team [phone] Pager: [phone]

## 2020-11-12 NOTE — Progress Notes (Signed)
Occupational Therapy Session Note  Patient Details  Name: Frances Kent MRN: [id number] Date of Birth: 02/19/1930  Today's Date: 11/12/2020 OT Individual Time: 1200-1230 OT Individual Time Calculation (min): 30 min    Short Term Goals: Week 1:  OT Short Term Goal 1 (Week 1): STE=LTG 2/2 ELOS  Skilled Therapeutic Interventions/Progress Updates:    Pt sitting up in recliner.  Reports feeling "tired".  BP assessed initially: 93/54 mmHg.  Therapist ace wrapped BLE to upper thigh.  Sit to stand with close supervision and BP assessed in standing: 110/56. MD made aware of vitals with and without ace wrap and verbal orders provided to continue wrapping BLE when pt upright. Stand to sit with CGA.  Pts lunch tray arrived and therapist provided setup.  Call bell in reach, daughter present to assist pt upon OT departure.  Therapy Documentation Precautions:  Precautions Precautions: Fall Precaution Comments: Macular degeneration Restrictions Weight Bearing Restrictions: No Other Position/Activity Restrictions: Monitor BP    Therapy/Group: Individual Therapy  Ezekiel Slocumb 11/12/2020, 3:33 PM

## 2020-11-13 ENCOUNTER — Other Ambulatory Visit (HOSPITAL_COMMUNITY): Payer: Self-pay

## 2020-11-13 MED ORDER — MEGESTROL ACETATE 400 MG/10ML PO SUSP
400.0000 mg | Freq: Two times a day (BID) | ORAL | 0 refills | Status: DC
  Filled 2020-11-13: qty 480, 24d supply, fill #0

## 2020-11-13 MED ORDER — METOPROLOL SUCCINATE ER 25 MG PO TB24
25.0000 mg | ORAL_TABLET | Freq: Every morning | ORAL | 0 refills | Status: DC
  Filled 2020-11-13: qty 30, 30d supply, fill #0

## 2020-11-13 MED ORDER — MEGESTROL ACETATE 400 MG/10ML PO SUSP
400.0000 mg | Freq: Two times a day (BID) | ORAL | Status: DC
  Administered 2020-11-14 – 2020-11-15 (×2): 400 mg via ORAL
  Filled 2020-11-13 (×3): qty 10

## 2020-11-13 MED ORDER — EZETIMIBE 10 MG PO TABS
10.0000 mg | ORAL_TABLET | Freq: Every day | ORAL | 0 refills | Status: DC
  Filled 2020-11-13: qty 30, 30d supply, fill #0

## 2020-11-13 MED ORDER — LEVOTHYROXINE SODIUM 50 MCG PO TABS
50.0000 ug | ORAL_TABLET | Freq: Every day | ORAL | 0 refills | Status: DC
  Filled 2020-11-13: qty 30, 30d supply, fill #0

## 2020-11-13 MED ORDER — CALCIUM CARBONATE ANTACID 500 MG PO CHEW: 1.0000 | CHEWABLE_TABLET | Freq: Two times a day (BID) | ORAL | Status: DC

## 2020-11-13 MED ORDER — MELATONIN 3 MG PO TABS
3.0000 mg | ORAL_TABLET | Freq: Every evening | ORAL | 0 refills | Status: DC | PRN
  Filled 2020-11-13: qty 30, 30d supply, fill #0

## 2020-11-13 MED ORDER — METOPROLOL SUCCINATE ER 25 MG PO TB24
12.5000 mg | ORAL_TABLET | Freq: Every morning | ORAL | Status: DC
  Administered 2020-11-14 – 2020-11-15 (×2): 12.5 mg via ORAL
  Filled 2020-11-13 (×2): qty 1

## 2020-11-13 MED ORDER — MELATONIN 3 MG PO TABS
1.5000 mg | ORAL_TABLET | Freq: Every day | ORAL | Status: DC
  Administered 2020-11-13 – 2020-11-14 (×2): 1.5 mg via ORAL
  Filled 2020-11-13 (×2): qty 1

## 2020-11-13 MED ORDER — METOPROLOL SUCCINATE ER 25 MG PO TB24
12.5000 mg | ORAL_TABLET | Freq: Every morning | ORAL | 0 refills | Status: DC
Start: 2020-11-14 — End: 2021-01-25
  Filled 2020-11-13: qty 15, 30d supply, fill #0

## 2020-11-13 MED ORDER — AMIODARONE HCL 200 MG PO TABS
100.0000 mg | ORAL_TABLET | Freq: Every day | ORAL | 0 refills | Status: DC
  Filled 2020-11-13: qty 15, 30d supply, fill #0

## 2020-11-13 MED ORDER — ADULT MULTIVITAMIN W/MINERALS CH: 1.0000 | ORAL_TABLET | Freq: Every day | ORAL | Status: DC

## 2020-11-13 MED ORDER — RIVAROXABAN 15 MG PO TABS
15.0000 mg | ORAL_TABLET | Freq: Every day | ORAL | 0 refills | Status: DC
  Filled 2020-11-13: qty 30, 30d supply, fill #0

## 2020-11-13 MED ORDER — FAMOTIDINE 20 MG PO TABS
20.0000 mg | ORAL_TABLET | Freq: Every day | ORAL | 0 refills | Status: DC
  Filled 2020-11-13: qty 30, 30d supply, fill #0

## 2020-11-13 NOTE — Progress Notes (Signed)
Occupational Therapy Session Note  Patient Details  Name: Frances Kent MRN: [id number] Date of Birth: 1930/03/29  Today's Date: 11/13/2020 OT Individual Time: 8588-5027 OT Individual Time Calculation (min): 41 min    Short Term Goals: Week 1:  OT Short Term Goal 1 (Week 1): STE=LTG 2/2 ELOS  Skilled Therapeutic Interventions/Progress Updates:  Pt greeted seated in recliner agreeable to OT intervention. Session focus on IADL mgmt and functional mobility. Pt completed stand pivot transfer from recliner>w/c with Rw and supervision. Pt transported to gym with total A for time mgmt. Pt completed IADL task in gym where pt instructed to ambulate around gym to collect items from various surface heights with gross supervision. Pt did require cues to locate items around room d/t visual deficits. Pt able to stand to fold wash cloths collected around room with gross supervision. Education provided on using RW bag for safety when transporting items at home, pt with difficulty understanding the importance of using bag to prevent falls at home. Pt additionally able to complete functional mobility to ADL apartment where pt worked on dynamic balance task of hanging clothes in closet. Pt needed cues for safety as pt rolling over clothing items with no awareness. Pt also completed functional mobility in apartment with pt completing sit<>stands from recliner and low couch with supervision. Pt able to ambulate back to room with supervision where pt left seated in recliner with family present and all needs within reach.   Therapy Documentation Precautions:  Precautions Precautions: Fall Precaution Comments: Macular degeneration Restrictions Weight Bearing Restrictions: No Other Position/Activity Restrictions: Monitor BP Pain: pt reports no pain during session    Therapy/Group: Individual Therapy  [name] 11/13/2020, 4:20 PM

## 2020-11-13 NOTE — Progress Notes (Signed)
Speech Language Pathology Discharge Summary  Patient Details  Name: Frances Kent MRN: [id number] Date of Birth: 1930-04-17  Today's Date: 11/13/2020 SLP Individual Time: 0830-0900 SLP Individual Time Calculation (min): 30 min  Today's Date: 11/14/2020 SLP Individual Time: 3710-6269 SLP Individual Time Calculation (min): 45 min  Skilled Therapeutic Interventions:  Pt seen for skilled ST with focus on cognitive goals, family present throughout. Pt states she feels tired most of the time and continues to has difficulty sleeping at night, reports being at or very close to baseline cognition. SLP facilitating discharge planning but discussing supervision level and assistance with higher level cognition including medication management. Pt completing mildly complex problem solving for functional tasks with min A cues for error awareness and repair. Pt has very supportive family who will be able to assist 24/7 with needs, not f/u ST warranted at d/c. Pt left in recliner with family present and all needs within reach.   Patient has met 4 of 4 long term goals.  Patient to discharge at overall Supervision;Min level.  Reasons goals not met: N/A   Clinical Impression/Discharge Summary: Patient made very good progress and met all LTG"s related to cognitive and linguistic functioning. She is no longer exhibiting any significant language errors as she had (perseverations, decreased divergent naming, etc). Cognitively, she is demonstrating significantly improved memory, problem solving and reasoning. She does require supervision to [facility] for completing mild complex problem solving tasks (medication management, etc) but is not exhibiting any unsafe, impulsive behaviors. As patient will have more than enough family support, SLP is not recommending f/u ST upon discharge from hospital. Her daughter is aware and in agreement with this. Paitent is being discharged due to meeting goals and discharging at a supervision to  minA level for cognitive function.  Care Partner:  Caregiver Able to Provide Assistance: Yes  Type of Caregiver Assistance: Physical;Cognitive  Recommendation:  None      Equipment: None for ST   Reasons for discharge: Treatment goals met;Discharged from hospital   Patient/Family Agrees with Progress Made and Goals Achieved: Yes    Dannial Monarch 11/13/2020, 4:12 PM

## 2020-11-13 NOTE — Progress Notes (Signed)
Speech Language Pathology Daily Session Note  Patient Details  Name: Frances Kent MRN: [id number] Date of Birth: 1930-03-30  Today's Date: 11/13/2020 SLP Individual Time: 0830-0900 SLP Individual Time Calculation (min): 30 min  Short Term Goals: Week 1: SLP Short Term Goal 1 (Week 1): Patient will demonstrate adequate awareness to errors and attempt self-correction when completing simulated mildly complex ADL tasks with supervision level A. SLP Short Term Goal 2 (Week 1): Patient will demonstrate ability to perform simulated medication management (pill box) organization and money management (check register) tasks with minA verbal and visual cues for accuracy. SLP Short Term Goal 3 (Week 1): Patient will maintain 100% speech intelligibility during conversations with SLP with intermittent verbal cues for speech strategies. (increase vocal intensity, overarticulate). SLP Short Term Goal 4 (Week 1): Patient will demonstrate adequate reasoning and awareness during simulated functional problem solving tasks, with minA verbal and visual cues. SLP Short Term Goal 5 (Week 1): Patient will name at least 10 items in a given category with minA verbal, semantic cues.  Skilled Therapeutic Interventions:   Patient seen for skilled ST session with focus on cognitive function goals. Patient was lying in bed but awake and alert and said she was feeling good. SLP introduced task of using medication list and simulate filling medications in for the week. Patient required minA cues to get started and to check for errors but demonstrated ability to perform. Daughter was not present until SLP was leaving. Patient was oriented to date and able to recall and discuss recent therapeutic and nursing interventions. She continues to be ready for discharge 10/2 with no f/u SLP services recommended.  Pain Pain Assessment Pain Scale: 0-10 Faces Pain Scale: No hurt  Therapy/Group: Individual Therapy  Sonia Baller, MA,  CCC-SLP Speech Therapy

## 2020-11-13 NOTE — Progress Notes (Signed)
Discharge- Inpatient Rehabilitation Medication Review by a Pharmacist  A complete drug regimen review was completed for this patient to identify any potential clinically significant medication issues.  High Risk Drug Classes Is patient taking? Indication by Medication  Antipsychotic No   Anticoagulant Yes Xarelto for afib, h/o PE,   Antibiotic No   Opioid No   Antiplatelet No   Hypoglycemics/insulin No   Vasoactive Medication Yes Toprol, Amo for HTN, afib, CHF  Chemotherapy No   Other No       Clinically significant medication issues were identified that warrant physician communication and completion of prescribed/recommended actions by midnight of the next day:  No  Time spent performing this drug regimen review (minutes):  10-8min  Frances Kent S. Alford Highland, PharmD, BCPS Clinical Staff Pharmacist [email or web address] [name] 11/13/2020 1:37 PM

## 2020-11-13 NOTE — Progress Notes (Addendum)
Physical Therapy Session Note  Patient Details  Name: Frances Kent MRN: [id number] Date of Birth: April 16, 1930  Today's Date: 11/13/2020 PT Individual Time: 1005-1100 and 1303-1330 PT Individual Time Calculation (min): 55 min and 27 min  Short Term Goals: Week 1:  PT Short Term Goal 1 (Week 1): STG = LTG due to ELOS Week 2:    Week 3:     Skilled Therapeutic Interventions/Progress Updates:   Pt initially supine and agreeable to session.  Abdominal binder, personal compression socks applied by therapist.  Supine to sit on edge of bed w/supervision and use of rail.  In sitting performed "warm up" exercises including heel raises, LAQs, overhead press, forward press.  Sit to stand w/supervision w/RW. Gait 163ft x 2, 20ft x 1 w/RW, close supervision, cues to attend to R environment.  Daughter educated re: safety implications of R inattention w/mobility/in home setting.   BP 110/94-89/78.   Therapist instructed daughter w/acewrapping using 3in acewraps/provided acewraps for home use and discussed obtaining thigh high compression garments thru pharmacy as custom garments likely needed. Pt performed gait trials including: Ascending and descending 59ft ramp, navigating functional gait course weaving thru cones to simulate home setting w/cga to close supervision and cues to attend to R.  Car transfer:  pt performed w/cues for safety, cga to close supervision, RW.  Daughter observed transfer.    At end of session, Pt left oob in recliner w/chair alarm set and needs in reach.  PM session: Pt initially oob in recliner.  Performed "warm up exercises as above.  BP 100/60 Sit to stand and gait x 135ft w/RW w/close supervision, cues for directions/attention to R environment. Pt w/seated rest between each mobility task.  C/o overall sense of fatigue and concern w/lack of improvement.  Pt asking questions regarding cardiac surgical options.  Educated pt to discuss w/cardiology at scheduled followup appt.     Stairs: Ascended/descended 4 stairs w/2 rails w/cga, cues for R hand placement on rail.  Gait 151ft as above. BP 103/70 Pt left oob in recliner w/chair alarm set and needs in reach.   Therapy Documentation Precautions:  Precautions Precautions: Fall Precaution Comments: Macular degeneration Restrictions Weight Bearing Restrictions: No Other Position/Activity Restrictions: Monitor BP     Therapy/Group: Individual Therapy Callie Fielding, [place] 11/13/2020, 12:34 PM

## 2020-11-13 NOTE — Progress Notes (Signed)
Physical Therapy Session Note  Patient Details  Name: Frances Kent MRN: [id number] Date of Birth: [birth year]  Today's Date: 11/13/2020 PT Individual Time: 1115-1155 PT Individual Time Calculation (min): 40 min   Short Term Goals: Week 1:  PT Short Term Goal 1 (Week 1): STG = LTG due to ELOS  Skilled Therapeutic Interventions/Progress Updates:   Received pt ambulating to bathroom with daughter (cleared on safety plan). Pt agreeable to PT treatment, and denied any pain during session but reported feeling "tired". Session with emphasis on functional mobility/transfers, generalized strengthening, dynamic standing balance/coordination, and improved activity tolerance. Pt ambulated to bathroom with CGA/min A provided by daughter and performed toileting tasks with assist from daughter. BP sitting in recliner with ted hose, abdominal binder, and ace wraps applied to BLEs: 94/69 and standing: 88/73 - pt asymptomatic. Pt performed the following exercises standing with BUE support on RW and CGA for balance: -mini squats 2x10 -alternating marches x10 bilaterally  BP 77/63 mid-exercising. RN notified and present and notified PA, [name]. Per, PA pt cleared to continue exercising as long as pt is not symptomatic. Concluded session with pt sitting in recliner with BLE elevated and all needs within reach.   Therapy Documentation Precautions:  Precautions Precautions: Fall Precaution Comments: Macular degeneration Restrictions Weight Bearing Restrictions: No Other Position/Activity Restrictions: Monitor BP  Therapy/Group: Individual Therapy Alfonse Alpers PT, DPT   11/13/2020, 7:39 AM

## 2020-11-13 NOTE — Progress Notes (Signed)
Inpatient Rehabilitation Care Coordinator Discharge Note DC SUNDAY 10/2  Patient Details  Name: Frances Kent MRN: [id number] Date of Birth: 03-Feb-1931   Discharge location: [place] 24/7 SUPERVISION  Length of Stay:  11 days  Discharge activity level: SUPERVISION LEVEL  Home/community participation: ACTIVE  Patient response SW:NIOEVO Literacy - How often do you need to have someone help you when you read instructions, pamphlets, or other written material from your doctor or pharmacy?: Never  Patient response JJ:KKXFGH Isolation - How often do you feel lonely or isolated from those around you?: Never  Services provided included: MD, RD, PT, OT, SLP, RN, CM, Pharmacy, SW  Financial Services:  Financial Services Utilized: Environmental education officer MEDICARE  Choices offered to/list presented to: PT AND DAUGHTER  Follow-up services arranged:  Home Health, Patient/Family has no preference for HH/DME agencies, DME Home Health Agency: [facility]-PT,OT,SP,RN    DME : [facility]-3 IN 1 AND TRANSPORT CHAIR    Patient response to transportation need: Is the patient able to respond to transportation needs?: Yes In the past 12 months, has lack of transportation kept you from medical appointments or from getting medications?: No In the past 12 months, has lack of transportation kept you from meetings, work, or from getting things needed for daily living?: No    Comments (or additional information):Frances Kent WAS HERE DAILY AND PARTICIPATE DIN PT'S CARE. VERY INVOLVED WITH HER MEDICAL CARE AND ASKED QUESTIONS DAILY.   Patient/Family verbalized understanding of follow-up arrangements:  Yes  Individual responsible for coordination of the follow-up plan: Frances Kent-DAUGHTER [phone]  Confirmed correct DME delivered: Frances Kent 11/13/2020    Frances Kent, [name]

## 2020-11-13 NOTE — Progress Notes (Signed)
PROGRESS NOTE   Subjective/Complaints: Looking much more bright this morning, less fatigued. Feels ready to d/c on Sunday. Daughter will scheduled outpatient cardiology follow-up for next week.   ROS: Patient denies CP, SOB,  N/V/D, +fatigue, dizziness with standing has resolved  Objective:   No results found. Recent Labs    11/12/20 0504  WBC 7.8  HGB 10.3*  HCT 34.1*  PLT 429*    Recent Labs    11/11/20 0547 11/12/20 0504  NA 136 138  K 4.8 4.8  CL 107 109  CO2 25 23  GLUCOSE 92 98  BUN 14 11  CREATININE 1.06* 0.90  CALCIUM 8.4* 8.7*    Intake/Output Summary (Last 24 hours) at 11/13/2020 1055 Last data filed at 11/13/2020 0720 Gross per 24 hour  Intake 720 ml  Output --  Net 720 ml        Physical Exam: Vital Signs Blood pressure 99/65, pulse 71, temperature 98.7 F (37.1 C), temperature source Oral, resp. rate 20, height 5\' 3"  (1.6 m), weight 41.5 kg, SpO2 98 %. Gen: no distress, normal appearing HEENT: oral mucosa pink and moist, NCAT Cardio: Reg rate Chest: normal effort, normal rate of breathing Abd: soft, non-distended Ext: no edema Psych: pleasant, normal affect Skin:    General: Skin is warm and dry. IV in place Neurological:     Mental Status: She is alert.     Comments: Alert and oriented x2 Motor: RUE/RLE: 4+/5 proximal distal LUE/LE: 4/5 proximal to distal      Assessment/Plan: 1. Functional deficits which require 3+ hours per day of interdisciplinary therapy in a comprehensive inpatient rehab setting. Physiatrist is providing close team supervision and 24 hour management of active medical problems listed below. Physiatrist and rehab team continue to assess barriers to discharge/monitor patient progress toward functional and medical goals  Care Tool:  Bathing    Body parts bathed by patient: Right arm, Left arm, Chest, Abdomen, Front perineal area, Right upper leg, Left upper  leg, Face   Body parts bathed by helper: Buttocks, Left lower leg, Right lower leg     Bathing assist Assist Level: Minimal Assistance - Patient > 75%     Upper Body Dressing/Undressing Upper body dressing   What is the patient wearing?: Pull over shirt    Upper body assist Assist Level: Set up assist    Lower Body Dressing/Undressing Lower body dressing      What is the patient wearing?: Pants, Incontinence brief     Lower body assist Assist for lower body dressing: Minimal Assistance - Patient > 75%     Toileting Toileting    Toileting assist Assist for toileting: Contact Guard/Touching assist     Transfers Chair/bed transfer  Transfers assist     Chair/bed transfer assist level: Contact Guard/Touching assist     Locomotion Ambulation   Ambulation assist      Assist level: Minimal Assistance - Patient > 75% Assistive device: No Device Max distance: 150   Walk 10 feet activity   Assist     Assist level: Minimal Assistance - Patient > 75% Assistive device: No Device   Walk 50 feet activity   Assist  Assist level: Minimal Assistance - Patient > 75% Assistive device: No Device    Walk 150 feet activity   Assist    Assist level: Contact Guard/Touching assist Assistive device: No Device    Walk 10 feet on uneven surface  activity   Assist     Assist level: Contact Guard/Touching assist     Wheelchair     Assist Is the patient using a wheelchair?: No             Wheelchair 50 feet with 2 turns activity    Assist            Wheelchair 150 feet activity     Assist          Blood pressure 99/65, pulse 71, temperature 98.7 F (37.1 C), temperature source Oral, resp. rate 20, height 5\' 3"  (1.6 m), weight 41.5 kg, SpO2 98 %.    Medical Problem List and Plan: 1.  Weakness, balance deficits with posterior lean, as well as SOB/tachycardia with activity secondary to left ICA occlusion.              -patient may shower             -ELOS/Goals: 10 days/supervision             -Continue CIR therapies including PT, OT, and SLP  2.  H/o DVT/A fib/Antithrombotics: -DVT/anticoagulation:  Pharmaceutical: Xarelto             -antiplatelet therapy: N/A 3. Pain Management: N/A 4. Mood/anxiety: LCSW to follow for evaluation and support.              -antipsychotic agents: N/A  D/c Xanax given hypotension 5. Neuropsych: This patient is not fully capable of making decisions on her own behalf. 6. Skin/Wound Care: Routine pressure relief measures.  7. Fluids/Electrolytes/Nutrition: Monitor I/Os 8. Afib with RVR: Monitor HR TID--continue cardizem, amiodarone and Xarelto.             --Monitor heart rate with increased exertion. Has had malaise/poor intake X weeks.              --EKG with prolonged Qtc- discussed with her outpatient cardiologist.  --Has pending eval with EP for PPM. SSS?   -9/24 suspect sob was anxiety driven. EKG ordered and is pending  -demonstrates orthostasis on AM  VS  -cardiology actively following 9. HTN: Monitor BP TID--continues to have intermittent hypotension with SBP in 90's             -orthostatic as above Vitals:   11/13/20 0452 11/13/20 0833  BP: (!) 99/59 99/65  Pulse: 99 71  Resp: 18 20  Temp: 98.9 F (37.2 C) 98.7 F (37.1 C)  SpO2: 99% 98%    10. CAP: Completed course of ceftriaxone, discussed with patient and daughter.  11. Hyperlipidemia: Continue Zetia due to intolerance of statins.  12.  AKI: resolved. Continue to hold Lasix given hypotension 13. Acute on chronic systolic CHF: Compensated--liberalized her diet given her lack of appetite.Marland Kitchen  BNP 700+ -defer volume mgt to cards -weight down, continue to hold lasix given hypotension Filed Weights   11/11/20 0424 11/12/20 0500 11/13/20 0452  Weight: 41.1 kg 44.9 kg 41.5 kg     14. Hypocalcemia: Will add tums for supplement. 15. H/o esophageal stricture/GERD: Off PPI due to prolonged QTC--will  replace with pepcid to prevent symptoms --continue reflux precautions.    16. Insomnia: Cardizem dosing changed so she is not woken at midnight.    -  continue melatonin 17.  Poor appetite place on D3 and start megace, improved. Continue megace 18. AKI: continue holding Lasix today, discussed with daughter, repeat creatinine tomorrow.  10. Fatigue: lopressor dose decreased by cardiology, recommended NAC 600mg  BID supplementation outpatient. Will consult cardiology regarding whether Lopressor can be further decreased  11. Bradycardia: continue to keep lasix off.  12. Hypotension: continue current regimen as per cardiology.   LOS: 9 days A FACE TO FACE EVALUATION WAS PERFORMED  Frances Kent Frances Kent 11/13/2020, 10:55 AM

## 2020-11-13 NOTE — Discharge Summary (Addendum)
Physician Discharge Summary  Patient ID: Frances Kent MRN: [id number] DOB/AGE: 09-02-1930 87 y.o.  Admit date: 11/04/2020 Discharge date: 11/15/2020  Discharge Diagnoses:  Principal Problem:   Left basal ganglia embolic stroke [facility] Active Problems:   Prolonged QT interval   Long term (current) use of anticoagulants   Hypokalemia   Chronic HFrEF (heart failure with reduced ejection fraction) ([name])   Discharged Condition: stable  Significant Diagnostic Studies: DG Chest 2 View  Result Date: 11/07/2020 CLINICAL DATA:  87 year old female with shortness of breath. EXAM: CHEST - 2 VIEW COMPARISON:  11/01/2020, 10/05/2020 FINDINGS: The patient is rotated to the left. The mediastinal contours are within normal limits. Unchanged cardiomegaly. Atherosclerotic calcifications of the aortic arch. The right lung is clear. Similar-appearing obscuration of the left costophrenic angle. Diffuse osteopenia. No acute osseous abnormality. IMPRESSION: Similar to slightly improved trace left pleural effusion. Unchanged cardiomegaly. Aortic Atherosclerosis (ICD10-I70.0). Electronically Signed   By: Ruthann Cancer M.D.   On: 11/07/2020 11:46    Labs:  Basic Metabolic Panel: Recent Labs  Lab 11/08/20 1306 11/10/20 0519 11/11/20 0547 11/12/20 0504  NA 140 138 136 138  K 4.6 4.6 4.8 4.8  CL 108 107 107 109  CO2 25 25 25 23   GLUCOSE 125* 90 92 98  BUN 11 15 14 11   CREATININE 1.06* 1.04* 1.06* 0.90  CALCIUM 8.6* 8.5* 8.4* 8.7*    CBC: CBC Latest Ref Rng & Units 11/12/2020 11/05/2020 11/04/2020  WBC 4.0 - 10.5 K/uL 7.8 7.4 8.0  Hemoglobin 12.0 - 15.0 g/dL 10.3(L) 9.0(L) 9.0(L)  Hematocrit 36.0 - 46.0 % 34.1(L) 28.6(L) 29.4(L)  Platelets 150 - 400 K/uL 429(H) 328 331     CBG: No results for input(s): GLUCAP in the last 168 hours.   Filed Weights   11/13/20 0452 11/14/20 0548 11/15/20 0635  Weight: 41.5 kg 42.5 kg 43.5 kg    Additional Labs: 11/05/20: TSH- 2.451    Mg- 2.2    Brief HPI:    Frances Kent is a 87 y.o. female with history of HTN, anxiety, PE/CHF-on Xarelto, esophageal stricture, COVID 01/22, malaise x3 weeks was admitted to [facility] on 10/30/2020 with fatigue, worsening of shortness of breath and palpitation.  She was treated for fluid overload, started on IV antibiotics due to concerns of LLL pneumonia and Cardizem for rate control.  On 09/18, she had near syncopal episode with syncope.  EKG showing prolonged QTC.  Later that day she developed right hemiparesis with aphasia and decreasing LOC with right hemianopsia.  CTA showed left ICA occlusion into proximal M1 with distal reconstitution and she was transferred to [facility] for cerebral angio with thrombectomy of left-ICA and left-MCA Dr. Earleen Newport.  Follow-up MRI brain showed tiny acute infarct in left basal ganglia and patent left-MCA.  2D echo done showing EF of 40 to 45%.  Amiodarone was initially held due to QT prolongation but rate control medications slowly resumed as follow-up EKG showed improvement.  Dr. Leonie Man felt that stroke was embolic due to A. fib and recommended taking Xarelto on a full stomach to help for better absorption.  Her verbal output was improving but she was noted to have dysarthria as well as weakness, balance deficits, SOB and tachycardia with activity.  CIR was recommended due to functional decline.   Hospital Course: Frances Kent was admitted to rehab 11/04/2020 for inpatient therapies to consist of PT, ST and OT at least three hours five days a week. Past admission physiatrist, therapy team and rehab  RN have worked together to provide customized collaborative inpatient rehab.  She was maintained on Xarelto throughout her stay with follow-up CBC showing H&H to be slowly improving.  Her p.o. intake continued to be poor in part due to her history of GERD and esophageal issues.  Megace was added to help stimulate her appetite and multiple supplements were offered throughout the day.  Her intake has improved  to 75-100% with family assisting/encouraging her during her stay.  Her mood has been stable and endurance level is slowly improving.  Follow-up EKG done past admission showed prolonged QT and Dr. Roda Shutters with cardiology felt that acute elongation was chronic however recommended decreasing amiodarone 100 mg a day. Serial check of lytes showed potassium level to be stable after brief supplementation. and improvement in renal status. Xanax was discontinued 09/27 and low dose melatonin added to help manage chronic insomnia.  Hospital course significant for recurrent issues with hypotension therefore Cardizem was discontinued.  TSH and magnesium levels are WNL.  She continued to have issues with hypotension and treated with gentle IV hydration x12 hours.  Abdominal binder as well as thigh-high compression has also been used to help with BP support.  Her blood pressures were staying stable for about a couple of days but then started trending downward therefore metoprolol was decreased to 12.5 mg daily.  Patient and family have been encouraged to increase fluid intake with frequent small meals throughout the day to help with overall intake.  She is to follow-up with her primary cardiologist few days after discharge for recheck and evaluation of medications.  Palliative care was consulted to discuss goals of care and patient and family elected on partial code with no CPR. She will continue to receive follow up [place], [place], [place] and [place] [facility] after discharge.    Rehab course: During patient's stay in rehab weekly team conferences were held to monitor patient's progress, set goals and discuss barriers to discharge. At admission, patient required min assist with ADL tasks and CGA with mobility. She exhibited mild ot moderate dysarthria with mild cognitive deficits.  She  has had improvement in activity tolerance, balance, postural control as well as ability to compensate for deficits. She is able to  complete ADL tasks with supervision.  She is independent for transfers and is able to ambulate 180 feet with rolling walker and cues for safe DME use.   She requires min cues for complex tasks and expression is back to baseline. Family education has been completed with daughters who will provide assistance as needed after discharge.   Disposition: Home  Diet: Heart Healthy.   Special Instructions: Eat frequent small meals/supplements. Wear binder/TEDS when up in chair/with activity.    Discharge Instructions     Ambulatory referral to Neurology   Complete by: As directed    Follow up with stroke clinic NP (Jessica Vanschaick or Cecille Rubin, if both not available, consider Zachery Dauer, or [place]) at [facility] in about 4 weeks. Thanks.   Diet - low sodium heart healthy   Complete by: As directed    Increase activity slowly   Complete by: As directed       Allergies as of 11/15/2020       Reactions   Statins    Severe myalgias        Medication List     STOP taking these medications    diltiazem 60 MG tablet Commonly known as: CARDIZEM   docusate sodium 100 MG capsule  Commonly known as: COLACE   insulin aspart 100 UNIT/ML injection Commonly known as: novoLOG       TAKE these medications    acetaminophen 500 MG tablet Commonly known as: TYLENOL Take 500 mg by mouth every 6 (six) hours as needed.   amiodarone 200 MG tablet Commonly known as: PACERONE Take 1/2 tablet (100 mg total) by mouth daily. What changed: how much to take   calcium carbonate 500 MG chewable tablet Commonly known as: TUMS - dosed in mg elemental calcium Chew 1 tablet (200 mg of elemental calcium total) by mouth 2 (two) times daily with a meal.   ezetimibe 10 MG tablet Commonly known as: ZETIA Take 1 tablet (10 mg total) by mouth daily.   famotidine 20 MG tablet Commonly known as: PEPCID Take 1 tablet (20 mg total) by mouth daily.   ipratropium 0.02 % nebulizer solution Commonly  known as: ATROVENT Take 2.5 mLs (0.5 mg total) by nebulization every 6 (six) hours as needed for wheezing or shortness of breath (cough).   levothyroxine 50 MCG tablet Commonly known as: Euthyrox Take 1 tablet (50 mcg total) by mouth daily at 6 (six) AM.   megestrol 40 MG/ML suspension Commonly known as: MEGACE Take 10 mLs (400 mg total) by mouth 2 (two) times daily.   melatonin 3 MG Tabs tablet Take 1 tablet (3 mg total) by mouth at bedtime as needed. Notes to patient: Take 1/2 to one tab as needed   metoprolol succinate 25 MG 24 hr tablet Commonly known as: TOPROL-XL Take 1/2 tablet (12.5 mg total) by mouth every morning.   multivitamin with minerals Tabs tablet Take 1 tablet by mouth daily.   polyethylene glycol 17 g packet Commonly known as: MIRALAX / GLYCOLAX Take 17 g by mouth daily as needed for moderate constipation.   Xarelto 15 MG Tabs tablet Generic drug: Rivaroxaban Take 1 tablet (15 mg total) by mouth daily with supper. Notes to patient: Needs to be taken with a big meal.         Follow-up Information     [name], MD Follow up.   Specialty: Internal Medicine Contact information: [place] [address] [phone]         Izora Ribas, MD Follow up.   Specialty: Physical Medicine and Rehabilitation Why: 11/30/20 please arrive at 3:00pm for 3:20pm appointment, thank you! Contact information: [address] [place] [address] [phone]         [name], MD. Call.   Specialty: Cardiology Why: for follow up on cardiac issues Contact information: [address] [facility] [place] [address] [phone]                 Signed: Bary Leriche 11/16/2020, 5:12 PM

## 2020-11-14 NOTE — Progress Notes (Signed)
Occupational Therapy Session Note  Patient Details  Name: Frances Kent MRN: [id number] Date of Birth: [birth year]  Today's Date: 11/14/2020 OT Individual Time: 0900-1000 OT Individual Time Calculation (min): 60 min    Short Term Goals: Week 1:  OT Short Term Goal 1 (Week 1): STE=LTG 2/2 ELOS  Skilled Therapeutic Interventions/Progress Updates:  Pt greeted supine in bed agreeable to OT intervention. Session focus on BADL reeducation,  functional mobility in prep for DC home tomorrow and education with family. Pt completed bed mob with supervision. OTA donned TEDS from EOB as pt reports her daughter will don at home however pt able to don socks from EOB with s/u. Additionally donned ABD binder from EOB BP 104/79 from sitting. Pt completed functional mobilty to sink for oral care with supervison with RW. Pt noted to lean forward to apply toothpaste to brush but suspect likely d/t  visual deficits as pt does endorse blurry vision especially in L eye. Pt completed ambulatory toilet transfer with RW and supervision. Pt completed 3/3 toileting tasks with supervision but needed MIN cues for safety awareness as pt unaware she hadn't pulled up her underwear underneath her pants. Remainder of session to focus on education with family. Daughter concerned about visual deficits, education provided on low vision strategies such as ensuring adequate light, decreasing clutter and applying neon tape to narrow doorways. Education provided to family on pts current level of assist, importance of managing pts BP and meds at home as well as reviewing energy conservation strategies for home. pt left seated in recliner with all needs within reach and family present. Family with no further questions regarding OT needs.                 Therapy Documentation Precautions:  Precautions Precautions: Fall Precaution Comments: Macular degeneration Restrictions Weight Bearing Restrictions: No Other Position/Activity  Restrictions: Monitor BP Pain:pt reports no pain during session.     Therapy/Group: Individual Therapy  Corinne Ports [facility] 11/14/2020, 11:30 AM

## 2020-11-14 NOTE — Progress Notes (Signed)
Physical Therapy Session Note  Patient Details  Name: Frances Kent MRN: [id number] Date of Birth: 1931/02/11  Today's Date: 11/14/2020 PT Individual Time: 1046-1130 PT Individual Time Calculation (min): 44 min   Short Term Goals: Week 1:  PT Short Term Goal 1 (Week 1): STG = LTG due to ELOS  Skilled Therapeutic Interventions/Progress Updates:    Pt received seated in recliner with family present, no c/o pain, compression socks and abdominal binder donned, and agreeable to PT. Session focused on activity tolerance and increased functional independence. Of note, BP checked frequently during session (after every activity), and remained stable. Session began with donning of ace wraps to further support BP. Pt transferred PT to therapy gym for time conservation. Pt completed 1x2ft amb with RW and supervision. Due to performance, pt attempted and completed 2 x80 ft amb with no RW and SBA. Pt was then educated on scanning and safety for navigation when entering rooms. Pt completed car transfer with SBA. Finally, pt completed standing dynmaic balance exercise of placing cards on velcro wallstand. Pt then returned to room, was assisted with doffing of ace wraps, and returned to recliner with supervision and no RW. Final check showed VSS. Pt was left seated in recliner with family and RN present, call bell and all needs in reach.  Therapy Documentation Precautions:  Precautions Precautions: Fall Precaution Comments: Macular degeneration Restrictions Weight Bearing Restrictions: No Other Position/Activity Restrictions: Monitor BP  Pain: Pain Assessment Pain Scale: 0-10 Pain Score: 0-No pain    Therapy/Group: Individual Therapy  Marquette Saa, PT, DPT 11/14/2020, 2:21 PM

## 2020-11-14 NOTE — Progress Notes (Signed)
Occupational Therapy Discharge Summary  Patient Details  Name: Frances Kent MRN: [id number] Date of Birth: [birth year]  Today's Date: 11/14/2020    Patient has met 10 of 10 long term goals due to improved activity tolerance, improved balance, postural control, and ability to compensate for deficits.  Pt has made progress towards OT goals during LOS d/t improved balance, improved activity tolerance, strength and coordination. Pt continues to present with impaired safety awareness and mild cognitive impairments with pt needing supervision for ADL participation/ IADL mgmt tasks for safety. Pts family has been apart of multiple OT sessions and demonstrate competency with managing pts level of care. Patient to discharge at overall Supervision level.  Patient's care partner is independent to provide the necessary physical and cognitive assistance at discharge.    Reasons goals not met: NA  Recommendation:  Patient will benefit from ongoing skilled OT services in home health setting to continue to advance functional skills in the area of BADL and Reduce care partner burden.  Equipment: Pt has needed DME; HHOT  Reasons for discharge: treatment goals met and discharge from hospital  Patient/family agrees with progress made and goals achieved: Yes  OT Discharge Precautions/Restrictions  Precautions Precautions: Fall Precaution Comments: Macular degeneration Restrictions Weight Bearing Restrictions: No Other Position/Activity Restrictions: Monitor BP Pain Pain Assessment Pain Score: 0-No pain ADL ADL Eating: Set up Where Assessed-Eating: Edge of bed Grooming: Setup Where Assessed-Grooming: Edge of bed Upper Body Bathing: Setup Where Assessed-Upper Body Bathing: Shower Lower Body Bathing: Minimal assistance Where Assessed-Lower Body Bathing: Shower Upper Body Dressing: Setup Where Assessed-Upper Body Dressing: Edge of bed Lower Body Dressing: Minimal assistance Where  Assessed-Lower Body Dressing: Edge of bed Toileting: Contact guard Where Assessed-Toileting: Editor, commissioning Method: Other (comment) Chief Strategy Officer) Science writer: Energy manager: Curator Method: Other (comment) (Ambulating) Walk-In Engineer, site: Grab bars ADL Comments: Bathing seated on BSC in shower Vision Baseline Vision/History: 1 Wears glasses (reading only) Patient Visual Report: No change from baseline (baseline macular degeneration) Vision Assessment?: Yes Eye Alignment: Within Functional Limits Ocular Range of Motion: Within Functional Limits Alignment/Gaze Preference: Within Defined Limits Tracking/Visual Pursuits: Able to track stimulus in all quads without difficulty Saccades: Within functional limits Visual Fields: No apparent deficits Additional Comments: pt reports increased blurriness in L eye but unclear if related to baseline MD Perception  Perception: Impaired Comments: mild inattention to L side d/t visual deficits Praxis Praxis: Intact Cognition Overall Cognitive Status: Impaired/Different from baseline Arousal/Alertness: Awake/alert Year: 2022 Month: October Day of Week: Correct Attention: Sustained Sustained Attention: Appears intact Memory: Impaired Memory Impairment: Retrieval deficit Immediate Memory Recall: Sock;Blue;Bed Memory Recall Sock: Not able to recall Memory Recall Blue: Without Cue Memory Recall Bed: Not able to recall Awareness: Impaired Awareness Impairment: Anticipatory impairment Problem Solving: Impaired Problem Solving Impairment: Functional complex;Verbal complex Safety/Judgment: Impaired Comments: impaired safety awarneness and problem solving skills Sensation Sensation Light Touch: Appears Intact Hot/Cold: Appears Intact Proprioception: Appears Intact Stereognosis: Not tested Coordination Gross Motor Movements are Fluid  and Coordinated: Yes Fine Motor Movements are Fluid and Coordinated: Yes Motor  Motor Motor: Hemiplegia Motor - Discharge Observations: improved RUE strength and coordination; overall WFL Mobility  Bed Mobility Bed Mobility: Supine to Sit;Sit to Supine Supine to Sit: Supervision/Verbal cueing Sit to Supine: Supervision/Verbal cueing Transfers Sit to Stand: Supervision/Verbal cueing Stand to Sit: Supervision/Verbal cueing  Trunk/Postural Assessment  Cervical Assessment Cervical Assessment: Within Functional Limits Thoracic Assessment Thoracic Assessment: Exceptions to  WFL (rounded shoulders) Lumbar Assessment Lumbar Assessment: Within Functional Limits Postural Control Postural Control: Within Functional Limits  Balance Balance Balance Assessed: Yes Static Sitting Balance Static Sitting - Balance Support: Right upper extremity supported;Left upper extremity supported;Feet unsupported Static Sitting - Level of Assistance: 7: Independent Dynamic Sitting Balance Dynamic Sitting - Balance Support: Feet supported;No upper extremity supported Dynamic Sitting - Level of Assistance: 5: Stand by assistance Dynamic Sitting - Balance Activities: Lateral lean/weight shifting;Forward lean/weight shifting;Reaching for objects Sitting balance - Comments: supervision for safety during ADLS Static Standing Balance Static Standing - Balance Support: No upper extremity supported Static Standing - Level of Assistance: 5: Stand by assistance Dynamic Standing Balance Dynamic Standing - Balance Support: Right upper extremity supported;During functional activity Dynamic Standing - Level of Assistance: 5: Stand by assistance Dynamic Standing - Balance Activities: Reaching for objects Dynamic Standing - Comments: unilateral support for ADLs with supervision for safety Extremity/Trunk Assessment RUE Assessment RUE Assessment: Within Functional Limits Passive Range of Motion (PROM) Comments:  WFL Active Range of Motion (AROM) Comments: WFL General Strength Comments: 3+/5 RUE Body System: Neuro Brunstrum levels for arm and hand: Arm;Hand Brunstrum level for arm: Stage V Relative Independence from Synergy Brunstrum level for hand: Stage VI Isolated joint movements RUE Strength RUE Overall Strength: Within Functional Limits for tasks performed Right Shoulder Flexion: 3+/5 Right Shoulder Extension: 3+/5 Right Elbow Flexion: 4-/5 Right Elbow Extension: 4-/5 Right Wrist Flexion: 3+/5 Right Wrist Extension: 3+/5 Right Hand Gross Grasp: Functional LUE Assessment LUE Assessment: Within Functional Limits Passive Range of Motion (PROM) Comments: WFL Active Range of Motion (AROM) Comments: [facility] General Strength Comments: 4-/5   [name] 11/14/2020, 9:09 AM

## 2020-11-16 ENCOUNTER — Other Ambulatory Visit: Payer: Self-pay | Admitting: Internal Medicine

## 2020-11-16 ENCOUNTER — Telehealth: Payer: Self-pay | Admitting: Internal Medicine

## 2020-11-16 NOTE — Telephone Encounter (Signed)
Patient daughter calling in for refill of thyroid medication. Verified she is on 50 mcg. Needing this sent in but no longer had Dr Derrel Nip name on this.  Pended for your approval or denial.

## 2020-11-16 NOTE — Discharge Summary (Signed)
Physical Therapy Discharge Summary  Patient Details  Name: Frances Kent MRN: [id number] Date of Birth: [birth year]  Patient has met 8 of 8 long term goals due to improved activity tolerance, improved balance, improved postural control, increased strength, and ability to compensate for deficits.  Patient to discharge at an ambulatory level Supervision.   Patient's care partner is independent to provide the necessary physical and cognitive assistance at discharge.  Reasons goals not met: n/a  Recommendation:  Patient will benefit from ongoing skilled PT services in home health setting to continue to advance safe functional mobility, address ongoing impairments in global deconditioning and weakness, balance deficits, gait impairments, fall prevention to minimize fall risk.  Equipment: RW  Reasons for discharge: treatment goals met and discharge from hospital  Patient/family agrees with progress made and goals achieved: Yes  PT Discharge Precautions/Restrictions Precautions Precautions: Fall Precaution Comments: Macular degeneration Restrictions Weight Bearing Restrictions: No Other Position/Activity Restrictions: Monitor BP  Pain   No pain Pain Interference Pain Interference Pain Effect on Sleep: 1. Rarely or not at all Pain Interference with Therapy Activities: 1. Rarely or not at all Pain Interference with Day-to-Day Activities: 1. Rarely or not at all Vision/Perception  Vision - History Ability to See in Adequate Light: 0 Adequate Vision - Assessment Eye Alignment: Within Functional Limits Ocular Range of Motion: Within Functional Limits Perception Perception: Within Functional Limits Praxis Praxis: Intact  Cognition Overall Cognitive Status: Within Functional Limits for tasks assessed Arousal/Alertness: Awake/alert Orientation Level: Oriented X4 Sustained Attention: Appears intact Problem Solving: Appears intact Safety/Judgment: Appears  intact Sensation Sensation Light Touch: Appears Intact Hot/Cold: Appears Intact Proprioception: Appears Intact Stereognosis: Not tested Coordination Gross Motor Movements are Fluid and Coordinated: No Fine Motor Movements are Fluid and Coordinated: Yes Heel Shin Test: WFL Motor  Motor Motor: Hemiplegia Motor - Discharge Observations: improved RUE strength and coordination; overall WFL  Mobility Bed Mobility Bed Mobility: Supine to Sit;Sit to Supine Rolling Right: Independent Rolling Left: Independent Supine to Sit: Independent with assistive device Sit to Supine: Independent with assistive device Transfers Transfers: Sit to Stand;Stand to Sit;Stand Pivot Transfers Sit to Stand: Independent with assistive device Stand to Sit: Independent with assistive device Stand Pivot Transfers: Independent with assistive device Transfer (Assistive device): Rolling walker Locomotion  Gait Ambulation: Yes Gait Assistance: Supervision/Verbal cueing Gait Distance (Feet): 180 Feet Assistive device: Rolling walker Gait Assistance Details: Verbal cues for safe use of DME/AE;Verbal cues for precautions/safety;Verbal cues for gait pattern Gait Gait: Yes Gait Pattern: Impaired Gait Pattern: Step-through pattern;Decreased step length - right;Decreased step length - left;Narrow base of support Gait velocity: decr Stairs / Additional Locomotion Stairs: Yes Stairs Assistance: Supervision/Verbal cueing Stair Management Technique: Two rails Number of Stairs: 12 Height of Stairs: 6 Wheelchair Mobility Wheelchair Mobility: No  Trunk/Postural Assessment  Cervical Assessment Cervical Assessment: Within Functional Limits Thoracic Assessment Thoracic Assessment: Within Functional Limits Lumbar Assessment Lumbar Assessment: Within Functional Limits Postural Control Postural Control: Within Functional Limits  Balance Balance Balance Assessed: Yes Static Sitting Balance Static Sitting - Balance  Support: Feet supported Static Sitting - Level of Assistance: 7: Independent Dynamic Sitting Balance Dynamic Sitting - Balance Support: Feet supported Dynamic Sitting - Level of Assistance: 7: Independent Static Standing Balance Static Standing - Balance Support: No upper extremity supported Static Standing - Level of Assistance: 5: Stand by assistance Dynamic Standing Balance Dynamic Standing - Balance Support: During functional activity Dynamic Standing - Level of Assistance: 5: Stand by assistance Extremity Assessment   RLE Assessment RLE  Assessment: Exceptions to [facility] General Strength Comments: Grossly 4-/5 LLE Assessment LLE Assessment: Exceptions to [facility] General Strength Comments: Grossly 4/5    Carrina Schoenberger P Deveion Denz PT 11/16/2020, 4:00 PM

## 2020-11-16 NOTE — Telephone Encounter (Signed)
Patient is calling in to let Dr.Tullo know that her mother has found her levothyroxine (EUTHYROX) 50 MCG tablet and no longer needs a refill.

## 2020-11-16 NOTE — Telephone Encounter (Signed)
Refill not necessary.

## 2020-11-16 NOTE — Telephone Encounter (Signed)
Refilled: 09/11/2020 Last OV: 10/23/2020 Next OV: 11/27/2020

## 2020-11-17 ENCOUNTER — Telehealth: Payer: Self-pay

## 2020-11-17 NOTE — Telephone Encounter (Signed)
Transition Care Management Unsuccessful Follow-up Telephone Call  Date of discharge and from where:  11/15/20 -[place]  Attempts:  1st Attempt  Reason for unsuccessful TCM follow-up call:  Unable to reach patient

## 2020-11-18 ENCOUNTER — Inpatient Hospital Stay: Payer: Medicare HMO | Admitting: Pulmonary Disease

## 2020-11-18 DIAGNOSIS — I6310 Cerebral infarction due to embolism of unspecified precerebral artery: Secondary | ICD-10-CM | POA: Diagnosis not present

## 2020-11-18 DIAGNOSIS — I050 Rheumatic mitral stenosis: Secondary | ICD-10-CM | POA: Diagnosis not present

## 2020-11-18 DIAGNOSIS — I5020 Unspecified systolic (congestive) heart failure: Secondary | ICD-10-CM | POA: Diagnosis not present

## 2020-11-18 DIAGNOSIS — E782 Mixed hyperlipidemia: Secondary | ICD-10-CM | POA: Diagnosis not present

## 2020-11-18 DIAGNOSIS — I10 Essential (primary) hypertension: Secondary | ICD-10-CM | POA: Diagnosis not present

## 2020-11-18 DIAGNOSIS — I272 Pulmonary hypertension, unspecified: Secondary | ICD-10-CM | POA: Diagnosis not present

## 2020-11-18 DIAGNOSIS — I480 Paroxysmal atrial fibrillation: Secondary | ICD-10-CM | POA: Diagnosis not present

## 2020-11-18 DIAGNOSIS — I4892 Unspecified atrial flutter: Secondary | ICD-10-CM | POA: Diagnosis not present

## 2020-11-18 DIAGNOSIS — I071 Rheumatic tricuspid insufficiency: Secondary | ICD-10-CM | POA: Diagnosis not present

## 2020-11-18 NOTE — Telephone Encounter (Signed)
Transition Care Management Unsuccessful Follow-up Telephone Call  Date of discharge and from where:  11/15/20 -[place]  Attempts:  2nd Attempt  Reason for unsuccessful TCM follow-up call:  Left voice message

## 2020-11-18 NOTE — Telephone Encounter (Signed)
Transition Care Management Unsuccessful Follow-up Telephone Call  Date of discharge and from where:  11/15/20-[place]  Attempts:  3rd Attempt  Reason for unsuccessful TCM follow-up call:  Left voice message.  TCM closed.

## 2020-11-19 ENCOUNTER — Telehealth: Payer: Self-pay | Admitting: Internal Medicine

## 2020-11-19 DIAGNOSIS — J188 Other pneumonia, unspecified organism: Secondary | ICD-10-CM | POA: Diagnosis not present

## 2020-11-19 DIAGNOSIS — N1831 Chronic kidney disease, stage 3a: Secondary | ICD-10-CM | POA: Diagnosis not present

## 2020-11-19 DIAGNOSIS — E876 Hypokalemia: Secondary | ICD-10-CM | POA: Diagnosis not present

## 2020-11-19 DIAGNOSIS — I4891 Unspecified atrial fibrillation: Secondary | ICD-10-CM | POA: Diagnosis not present

## 2020-11-19 DIAGNOSIS — I69353 Hemiplegia and hemiparesis following cerebral infarction affecting right non-dominant side: Secondary | ICD-10-CM | POA: Diagnosis not present

## 2020-11-19 DIAGNOSIS — E034 Atrophy of thyroid (acquired): Secondary | ICD-10-CM | POA: Diagnosis not present

## 2020-11-19 DIAGNOSIS — I130 Hypertensive heart and chronic kidney disease with heart failure and stage 1 through stage 4 chronic kidney disease, or unspecified chronic kidney disease: Secondary | ICD-10-CM | POA: Diagnosis not present

## 2020-11-19 DIAGNOSIS — I5023 Acute on chronic systolic (congestive) heart failure: Secondary | ICD-10-CM | POA: Diagnosis not present

## 2020-11-19 DIAGNOSIS — F411 Generalized anxiety disorder: Secondary | ICD-10-CM | POA: Diagnosis not present

## 2020-11-19 NOTE — Telephone Encounter (Signed)
Verbal okay given.  

## 2020-11-19 NOTE — Telephone Encounter (Signed)
[facility] calling in and states that due to circumstances they have been unable to start Patient's home health referral until now. Will start 11/20/20 and needing a new verbal start of care as the initial care was delayed beyond 48 hours of the new referral.   If this is not okay they will need a call back.

## 2020-11-19 NOTE — Telephone Encounter (Signed)
done

## 2020-11-20 DIAGNOSIS — J188 Other pneumonia, unspecified organism: Secondary | ICD-10-CM | POA: Diagnosis not present

## 2020-11-20 DIAGNOSIS — I5023 Acute on chronic systolic (congestive) heart failure: Secondary | ICD-10-CM | POA: Diagnosis not present

## 2020-11-20 DIAGNOSIS — E034 Atrophy of thyroid (acquired): Secondary | ICD-10-CM | POA: Diagnosis not present

## 2020-11-20 DIAGNOSIS — N1831 Chronic kidney disease, stage 3a: Secondary | ICD-10-CM | POA: Diagnosis not present

## 2020-11-20 DIAGNOSIS — E876 Hypokalemia: Secondary | ICD-10-CM | POA: Diagnosis not present

## 2020-11-20 DIAGNOSIS — F411 Generalized anxiety disorder: Secondary | ICD-10-CM | POA: Diagnosis not present

## 2020-11-20 DIAGNOSIS — I130 Hypertensive heart and chronic kidney disease with heart failure and stage 1 through stage 4 chronic kidney disease, or unspecified chronic kidney disease: Secondary | ICD-10-CM | POA: Diagnosis not present

## 2020-11-20 DIAGNOSIS — I4891 Unspecified atrial fibrillation: Secondary | ICD-10-CM | POA: Diagnosis not present

## 2020-11-20 DIAGNOSIS — I69353 Hemiplegia and hemiparesis following cerebral infarction affecting right non-dominant side: Secondary | ICD-10-CM | POA: Diagnosis not present

## 2020-11-20 NOTE — Telephone Encounter (Signed)
Cara from [facility] is calling to get verbal orders for the patient for once a week four times a week.Please call her at [phone] stated it is ok to leave a voicemail on her secure voicemail.

## 2020-11-20 NOTE — Telephone Encounter (Signed)
Verbal orders given  

## 2020-11-23 ENCOUNTER — Ambulatory Visit: Payer: Medicare HMO | Admitting: Pulmonary Disease

## 2020-11-23 DIAGNOSIS — H353212 Exudative age-related macular degeneration, right eye, with inactive choroidal neovascularization: Secondary | ICD-10-CM | POA: Diagnosis not present

## 2020-11-24 ENCOUNTER — Telehealth: Payer: Self-pay | Admitting: Student

## 2020-11-24 NOTE — Telephone Encounter (Signed)
Attempted to contact patient's daughter [name] to schedule Palliative Consult, no answer and mailbox was full.  I then attempted to reach other daughter Silva Bandy, with no answer and no Voicemail set up.

## 2020-11-25 ENCOUNTER — Telehealth: Payer: Self-pay | Admitting: Student

## 2020-11-25 NOTE — Telephone Encounter (Signed)
Spoke with patient's daughter Woodfin Ganja, regarding the Palliative referral/services and all questions were answered and she was in agreement with scheduling visit.  I have scheduled an In-home Consult for 11/30/20 @ 2:30 PM

## 2020-11-26 DIAGNOSIS — I5023 Acute on chronic systolic (congestive) heart failure: Secondary | ICD-10-CM | POA: Diagnosis not present

## 2020-11-26 DIAGNOSIS — N1831 Chronic kidney disease, stage 3a: Secondary | ICD-10-CM | POA: Diagnosis not present

## 2020-11-26 DIAGNOSIS — E034 Atrophy of thyroid (acquired): Secondary | ICD-10-CM | POA: Diagnosis not present

## 2020-11-26 DIAGNOSIS — F411 Generalized anxiety disorder: Secondary | ICD-10-CM | POA: Diagnosis not present

## 2020-11-26 DIAGNOSIS — I4891 Unspecified atrial fibrillation: Secondary | ICD-10-CM | POA: Diagnosis not present

## 2020-11-26 DIAGNOSIS — I69353 Hemiplegia and hemiparesis following cerebral infarction affecting right non-dominant side: Secondary | ICD-10-CM | POA: Diagnosis not present

## 2020-11-26 DIAGNOSIS — E876 Hypokalemia: Secondary | ICD-10-CM | POA: Diagnosis not present

## 2020-11-26 DIAGNOSIS — I130 Hypertensive heart and chronic kidney disease with heart failure and stage 1 through stage 4 chronic kidney disease, or unspecified chronic kidney disease: Secondary | ICD-10-CM | POA: Diagnosis not present

## 2020-11-26 DIAGNOSIS — J188 Other pneumonia, unspecified organism: Secondary | ICD-10-CM | POA: Diagnosis not present

## 2020-11-27 ENCOUNTER — Ambulatory Visit: Payer: Medicare HMO | Admitting: Internal Medicine

## 2020-11-27 DIAGNOSIS — I4819 Other persistent atrial fibrillation: Secondary | ICD-10-CM | POA: Diagnosis not present

## 2020-11-28 DIAGNOSIS — I4891 Unspecified atrial fibrillation: Secondary | ICD-10-CM | POA: Diagnosis not present

## 2020-11-28 DIAGNOSIS — E876 Hypokalemia: Secondary | ICD-10-CM | POA: Diagnosis not present

## 2020-11-28 DIAGNOSIS — J188 Other pneumonia, unspecified organism: Secondary | ICD-10-CM | POA: Diagnosis not present

## 2020-11-28 DIAGNOSIS — I5023 Acute on chronic systolic (congestive) heart failure: Secondary | ICD-10-CM | POA: Diagnosis not present

## 2020-11-28 DIAGNOSIS — N1831 Chronic kidney disease, stage 3a: Secondary | ICD-10-CM | POA: Diagnosis not present

## 2020-11-28 DIAGNOSIS — F411 Generalized anxiety disorder: Secondary | ICD-10-CM | POA: Diagnosis not present

## 2020-11-28 DIAGNOSIS — I69353 Hemiplegia and hemiparesis following cerebral infarction affecting right non-dominant side: Secondary | ICD-10-CM | POA: Diagnosis not present

## 2020-11-28 DIAGNOSIS — E034 Atrophy of thyroid (acquired): Secondary | ICD-10-CM | POA: Diagnosis not present

## 2020-11-28 DIAGNOSIS — I130 Hypertensive heart and chronic kidney disease with heart failure and stage 1 through stage 4 chronic kidney disease, or unspecified chronic kidney disease: Secondary | ICD-10-CM | POA: Diagnosis not present

## 2020-11-30 ENCOUNTER — Other Ambulatory Visit: Payer: Self-pay

## 2020-11-30 ENCOUNTER — Other Ambulatory Visit: Payer: Self-pay | Admitting: Student

## 2020-11-30 ENCOUNTER — Encounter: Payer: Medicare HMO | Admitting: Physical Medicine and Rehabilitation

## 2020-11-30 DIAGNOSIS — Z515 Encounter for palliative care: Secondary | ICD-10-CM | POA: Diagnosis not present

## 2020-11-30 DIAGNOSIS — Z86010 Personal history of colonic polyps: Secondary | ICD-10-CM

## 2020-11-30 DIAGNOSIS — F5104 Psychophysiologic insomnia: Secondary | ICD-10-CM

## 2020-11-30 DIAGNOSIS — E43 Unspecified severe protein-calorie malnutrition: Secondary | ICD-10-CM | POA: Diagnosis not present

## 2020-11-30 DIAGNOSIS — I69353 Hemiplegia and hemiparesis following cerebral infarction affecting right non-dominant side: Secondary | ICD-10-CM | POA: Diagnosis not present

## 2020-11-30 DIAGNOSIS — J188 Other pneumonia, unspecified organism: Secondary | ICD-10-CM | POA: Diagnosis not present

## 2020-11-30 DIAGNOSIS — M5416 Radiculopathy, lumbar region: Secondary | ICD-10-CM

## 2020-11-30 DIAGNOSIS — I081 Rheumatic disorders of both mitral and tricuspid valves: Secondary | ICD-10-CM

## 2020-11-30 DIAGNOSIS — K219 Gastro-esophageal reflux disease without esophagitis: Secondary | ICD-10-CM

## 2020-11-30 DIAGNOSIS — I959 Hypotension, unspecified: Secondary | ICD-10-CM

## 2020-11-30 DIAGNOSIS — G4700 Insomnia, unspecified: Secondary | ICD-10-CM

## 2020-11-30 DIAGNOSIS — E46 Unspecified protein-calorie malnutrition: Secondary | ICD-10-CM

## 2020-11-30 DIAGNOSIS — I495 Sick sinus syndrome: Secondary | ICD-10-CM

## 2020-11-30 DIAGNOSIS — Z86711 Personal history of pulmonary embolism: Secondary | ICD-10-CM

## 2020-11-30 DIAGNOSIS — E034 Atrophy of thyroid (acquired): Secondary | ICD-10-CM

## 2020-11-30 DIAGNOSIS — I130 Hypertensive heart and chronic kidney disease with heart failure and stage 1 through stage 4 chronic kidney disease, or unspecified chronic kidney disease: Secondary | ICD-10-CM | POA: Diagnosis not present

## 2020-11-30 DIAGNOSIS — Z7901 Long term (current) use of anticoagulants: Secondary | ICD-10-CM

## 2020-11-30 DIAGNOSIS — R531 Weakness: Secondary | ICD-10-CM | POA: Diagnosis not present

## 2020-11-30 DIAGNOSIS — H3530 Unspecified macular degeneration: Secondary | ICD-10-CM

## 2020-11-30 DIAGNOSIS — Z86718 Personal history of other venous thrombosis and embolism: Secondary | ICD-10-CM

## 2020-11-30 DIAGNOSIS — E876 Hypokalemia: Secondary | ICD-10-CM

## 2020-11-30 DIAGNOSIS — E785 Hyperlipidemia, unspecified: Secondary | ICD-10-CM

## 2020-11-30 DIAGNOSIS — J849 Interstitial pulmonary disease, unspecified: Secondary | ICD-10-CM

## 2020-11-30 DIAGNOSIS — I4891 Unspecified atrial fibrillation: Secondary | ICD-10-CM

## 2020-11-30 DIAGNOSIS — N1831 Chronic kidney disease, stage 3a: Secondary | ICD-10-CM | POA: Diagnosis not present

## 2020-11-30 DIAGNOSIS — I480 Paroxysmal atrial fibrillation: Secondary | ICD-10-CM | POA: Diagnosis not present

## 2020-11-30 DIAGNOSIS — Z8616 Personal history of COVID-19: Secondary | ICD-10-CM

## 2020-11-30 DIAGNOSIS — M47812 Spondylosis without myelopathy or radiculopathy, cervical region: Secondary | ICD-10-CM

## 2020-11-30 DIAGNOSIS — F411 Generalized anxiety disorder: Secondary | ICD-10-CM | POA: Diagnosis not present

## 2020-11-30 DIAGNOSIS — K449 Diaphragmatic hernia without obstruction or gangrene: Secondary | ICD-10-CM

## 2020-11-30 DIAGNOSIS — I5023 Acute on chronic systolic (congestive) heart failure: Secondary | ICD-10-CM | POA: Diagnosis not present

## 2020-11-30 NOTE — Progress Notes (Signed)
[facility] Palliative Care Consult Note Telephone: [phone]  Fax: [phone]   Date of encounter: 11/30/20 2:24 PM PATIENT NAME: Frances Kent [address] [place] [id number]   [phone] (home)  DOB: 1930-12-25 MRN: [id number] PRIMARY CARE PROVIDER:    [name], MD,  [place] [address] [phone]  REFERRING PROVIDER:   [name], MD [place],  [place] [address] [phone]  RESPONSIBLE PARTY:    Contact Information     Name Relation Home Work [place] D Daughter [phone]  [phone]   [facility] Daughter [phone]  [phone]        I met face to face with patient and family in the home. Palliative Care was asked to follow this patient by consultation request of  Frances Mc, MD to address advance care planning and complex medical decision making. This is the initial visit.                                     ASSESSMENT AND PLAN / RECOMMENDATIONS:   Advance Care Planning/Goals of Care: Goals include to maximize quality of life and symptom management. Patient/health care surrogate gave his/her permission to discuss.Our advance care planning conversation included a discussion about:    The value and importance of advance care planning  Experiences with loved ones who have been seriously ill or have died  Exploration of personal, cultural or spiritual beliefs that might influence medical decisions  Exploration of goals of care in the event of a sudden injury or illness  Has HCPOA, shared between daughters Reviewed MOST form today; will discuss further with daughters. Likely to elect DNR Has a Living Will CODE STATUS: Full Code  Symptom Management/Plan:  Generalized weakness-secondary to acute ischemic CVA, systolic HF-continue PT/OT as directed. Encourage patient to use walker for ambulation, balance.   Atrial  fibrillation-amiodarone increased to 200 mg daily; continue metoprolol, Xarelto as directed. Plan is for cardioversion if she does not convert. Follow up with cardiology and electrophysiology as scheduled.   Protein calorie malnutrition- albumin 2.3. Continue megace as directed. Encourage foods patient enjoys.   Insomnia-recommend restarting melatonin QHS; consider restarting alprazolam PRN.   Follow up Palliative Care Visit: Palliative care will continue to follow for complex medical decision making, advance care planning, and clarification of goals. Return in 6-8 weeks or prn.  I spent 45 minutes providing this consultation. More than 50% of the time in this consultation was spent in counseling and care coordination.   PPS: 50%  HOSPICE ELIGIBILITY/DIAGNOSIS: TBD  Chief Complaint: Palliative Medicine initial consult.  HISTORY OF PRESENT ILLNESS:  Frances Kent is a 87 y.o. year old female  with bradycardia, atrial fibrillation, atrial flutter, fatigue, weight loss, systolic heart failure, moderate mitral stenosis, chronic pulmonary hypertension, hyperlipidemia, CVA, CKD 3, abdominal aortic atherosclerosis. Hospitalized 9/18-9/21/22 acute CVA due to occlusion of left carotid artery, acute on chronic systolic HF, protein calorie malnutrition and rehab 9/21-10/03/2020.  Patient resides at home; daughter is living there to assist caring for patient. Currently receiving PT/OT/SN services. Needs assist with her balance. Sometimes uses walker. Daughter [name] states patient had Covid-19 in January, and has been in atrial fibrillation since then. She was seen by Dr. Marcello Moores on Friday; her amiodarone was increased. Patient states that today is the first day she feels like she has  more energy. Denies pain, chest pain, dizziness, feeling light headed. Endorses occasional shortness of breath. Denies nausea or constipation. Her appetite has improved since starting Megace; weight up to 106 pounds. She had  been down to 95 pounds. Wears compression socks, feet occasionally puffy. She endorses sleep difficulty at times. Had taken alprazolam, stopped in hospital. Has tried melatonin in the past. Denies any DME needs; has  transport chair, bedside commode.    History obtained from review of EMR, discussion with primary team, and interview with family, facility staff/caregiver and/or Ms. Joubert.  I reviewed available labs, medications, imaging, studies and related documents from the EMR.  Records reviewed and summarized above.   ROS  General: NAD EYES: denies vision changes ENMT: denies dysphagia Cardiovascular: denies chest pain Pulmonary: denies cough, occasional SOB with exertion Abdomen: endorses improved appetite, denies constipation, endorses continence of bowel GU: denies dysuria, endorses continence of urine MSK: weakness, no falls reported Skin: denies rashes or wounds Neurological: denies pain, insomnia Psych: Endorses positive mood Heme/lymph/immuno: denies bruises, abnormal bleeding  Physical Exam: Weight 106 pounds Pulse 64, resp 18, b/p 118/52 sats 97% on room air Constitutional: NAD General: frail appearing, thin EYES: anicteric sclera, lids intact, no discharge  ENMT: intact hearing, oral mucous membranes moist CV: S1S2, RRR, no LE edema Pulmonary: LCTA, no increased work of breathing, no cough, room air Abdomen: normo-active BS + 4 quadrants, soft and non tender GU: deferred MSK: sarcopenia, moves all extremities, ambulatory Skin: warm and dry, no rashes or wounds on visible skin Neuro: generalized weakness, A & O x 3 Psych: non-anxious affect, pleasant Hem/lymph/immuno: no widespread bruising CURRENT PROBLEM LIST:  Patient Active Problem List   Diagnosis Date Noted   Long term (current) use of anticoagulants    Hypokalemia    Chronic HFrEF (heart failure with reduced ejection fraction) (HCC)    Left basal ganglia embolic stroke ([place]) 16/38/4665    Protein-calorie malnutrition, severe 11/03/2020   Acute ischemic left ICA stroke ([place]) 11/01/2020   Acute cerebrovascular accident (CVA) due to occlusion of left carotid artery ([place]) 11/01/2020   Endotracheally intubated    Prolonged QT interval    Acute on chronic systolic (congestive) heart failure (HCC)    Atrial fibrillation with RVR ([place]) 10/30/2020   A-fib ([place]) 10/16/2020   Antibiotic-associated diarrhea 10/15/2020   ILD (interstitial lung disease) ([place]) 09/22/2020   Tachyarrhythmia 09/08/2020   Sick sinus syndrome ([place]) 09/08/2020   Elevated serum free T4 level 09/08/2020   Purpura senilis ([place]) 08/28/2020   Hiatal hernia 08/09/2020   Atrial fibrillation with rapid ventricular response ([place]) 08/06/2020   Thrombophilia ([place]) 08/06/2020   History of COVID-19 03/03/2020   Schatzki's ring of distal esophagus 12/11/2019   Erosive gastritis 11/30/2019   Dysphagia 11/27/2019   Painful swallowing 11/27/2019   Gastroesophageal reflux disease 11/27/2019   Stage 3a chronic kidney disease ([place]) 04/11/2019   Coagulopathy ([place]) 09/12/2018   Essential hypertension 09/12/2018   CHF (congestive heart failure) ([place]) 08/15/2018   Hypothyroidism due to acquired atrophy of thyroid 05/08/2018   Unintentional weight loss 05/08/2018   Myalgia due to statin 05/08/2018   Abdominal aortic atherosclerosis ([place]) 05/07/2018   Bradycardia 04/28/2018   Pleural effusion on left 02/27/2018   Moderate mitral stenosis 01/10/2018   Lumbar radiculitis 01/07/2018   B12 deficiency 07/26/2017   Atrial fibrillation status post cardioversion [facility]) 04/06/2016   Hospital discharge follow-up 03/10/2016   Vitamin D deficiency 04/08/2015   Cervical spine degeneration 12/13/2014   History of pulmonary  embolism 12/02/2014   Insomnia 10/04/2014   Moderate tricuspid insufficiency 09/17/2014   Generalized anxiety disorder 03/28/2013   Mitral valve prolapse 03/28/2013   Routine adult health maintenance 03/23/2012    Fatigue 03/23/2012   Cough 04/17/2011   Screening for breast cancer 11/29/2010   Macular degeneration, left eye 11/29/2010   Screening for colon cancer 11/29/2010   Hyperlipidemia LDL goal <70 11/29/2010   GERD (gastroesophageal reflux disease)    PAST MEDICAL HISTORY:  Active Ambulatory Problems    Diagnosis Date Noted   Screening for breast cancer 11/29/2010   Macular degeneration, left eye 11/29/2010   Screening for colon cancer 11/29/2010   GERD (gastroesophageal reflux disease)    Hyperlipidemia LDL goal <70 11/29/2010   Cough 04/17/2011   Routine adult health maintenance 03/23/2012   Fatigue 03/23/2012   Generalized anxiety disorder 03/28/2013   Mitral valve prolapse 03/28/2013   Insomnia 10/04/2014   History of pulmonary embolism 12/02/2014   Cervical spine degeneration 12/13/2014   Vitamin D deficiency 04/08/2015   Hospital discharge follow-up 03/10/2016   Atrial fibrillation status post cardioversion ([place]) 04/06/2016   B12 deficiency 07/26/2017   Lumbar radiculitis 01/07/2018   Abdominal aortic atherosclerosis ([place]) 05/07/2018   Hypothyroidism due to acquired atrophy of thyroid 05/08/2018   Unintentional weight loss 05/08/2018   Myalgia due to statin 05/08/2018   CHF (congestive heart failure) ([place]) 08/15/2018   Coagulopathy ([place]) 09/12/2018   Essential hypertension 09/12/2018   Stage 3a chronic kidney disease ([place]) 04/11/2019   Bradycardia 04/28/2018   Moderate mitral stenosis 01/10/2018   Moderate tricuspid insufficiency 09/17/2014   Pleural effusion on left 02/27/2018   Dysphagia 11/27/2019   Painful swallowing 11/27/2019   Gastroesophageal reflux disease 11/27/2019   Erosive gastritis 11/30/2019   Schatzki's ring of distal esophagus 12/11/2019   History of COVID-19 03/03/2020   Atrial fibrillation with rapid ventricular response ([place]) 08/06/2020   Thrombophilia ([place]) 08/06/2020   Hiatal hernia 08/09/2020   Purpura senilis ([place]) 08/28/2020    Tachyarrhythmia 09/08/2020   Sick sinus syndrome ([place]) 09/08/2020   Elevated serum free T4 level 09/08/2020   ILD (interstitial lung disease) ([place]) 09/22/2020   Antibiotic-associated diarrhea 10/15/2020   A-fib ([place]) 10/16/2020   Atrial fibrillation with RVR ([place]) 10/30/2020   Acute ischemic left ICA stroke ([place]) 11/01/2020   Acute cerebrovascular accident (CVA) due to occlusion of left carotid artery ([place]) 11/01/2020   Endotracheally intubated    Prolonged QT interval    Acute on chronic systolic (congestive) heart failure ([place])    Protein-calorie malnutrition, severe 11/03/2020   Left basal ganglia embolic stroke ([place]) 42/87/6811   Long term (current) use of anticoagulants    Hypokalemia    Chronic HFrEF (heart failure with reduced ejection fraction) ([place])    Resolved Ambulatory Problems    Diagnosis Date Noted   IPF (idiopathic pulmonary fibrosis) ([place])    Genital ulcer, female 01/18/2011   Knee pain, acute, right 07/19/2011   Hematochezia 10/13/2011   Irregular heart rhythm 10/02/2012   Near syncope 02/06/2013   Skin lesion of face 12/21/2013   Achilles tendinosis 12/21/2013   Atrial fibrillation ([place]) 04/03/2014   Pain of right lower extremity 12/01/2014   Nausea 12/04/2014   Palpitation 12/04/2014   Right ankle swelling 12/21/2015   SOB (shortness of breath) 03/04/2016   Chronic pulmonary edema 03/10/2016   Atrial fibrillation with RVR ([place]) 04/04/2016   Hypotension 57/26/2035   Acute diastolic CHF (congestive heart failure) ([place]) 04/06/2016   Generalized weakness 04/06/2016  Benign breast cyst in female, left 10/07/2016   Basophilia 07/26/2017   Low back pain 09/02/2017   Transaminitis 01/07/2018   Grief reaction 09/12/2018   Hypothyroidism 04/11/2019   Biceps muscle strain, left, subsequent encounter 04/24/2019   Skin lesion of left lower extremity 06/13/2019   Skin neoplasm 06/22/2019   Diarrhea, functional 09/15/2019   Bilateral pulmonary embolism ([place])  12/10/2014   Acute saddle pulmonary embolism without acute cor pulmonale ([place]) 04/14/2016   Acute respiratory failure with hypoxia ([place]) 08/28/2020   Past Medical History:  Diagnosis Date   Colon adenomas    Hypertension    Macular degeneration    Mitral regurgitation    Pulmonary embolism ([place]) 02/2016   SVT (supraventricular tachycardia) ([place])    SOCIAL HX:  Social History   Tobacco Use   Smoking status: Never   Smokeless tobacco: Never   Tobacco comments:    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 , worked at [employer], [employer]  Substance Use Topics   Alcohol use: No   FAMILY HX:  Family History  Problem Relation Age of Onset   Cancer Mother 78       breast cancer, lived to 65,    Breast cancer Mother 96   Cancer Son 30       pancreatic cancer   Heart disease Son        CAD, Tobacco Abuse    Heart disease Maternal Grandmother 80       died of massive MI   Stroke Sister       ALLERGIES:  Allergies  Allergen Reactions   Statins     Severe myalgias     PERTINENT MEDICATIONS:  Outpatient Encounter Medications as of 11/30/2020  Medication Sig   acetaminophen (TYLENOL) 500 MG tablet Take 500 mg by mouth every 6 (six) hours as needed.   amiodarone (PACERONE) 200 MG tablet Take 1/2 tablet (100 mg total) by mouth daily.   calcium carbonate (TUMS - DOSED IN MG ELEMENTAL CALCIUM) 500 MG chewable tablet Chew 1 tablet (200 mg of elemental calcium total) by mouth 2 (two) times daily with a meal.   ezetimibe (ZETIA) 10 MG tablet Take 1 tablet (10 mg total) by mouth daily.   famotidine (PEPCID) 20 MG tablet Take 1 tablet (20 mg total) by mouth daily.   furosemide (LASIX) 20 MG tablet Take 20 mg by mouth daily as needed.   ipratropium (ATROVENT) 0.02 % nebulizer solution Take 2.5 mLs (0.5 mg total) by nebulization every 6 (six) hours as needed for wheezing or shortness of breath (cough).   levothyroxine (EUTHYROX) 50 MCG tablet Take 1 tablet (50 mcg total) by mouth daily at 6 (six) AM.    megestrol (MEGACE) 400 MG/10ML suspension Take 10 mLs (400 mg total) by mouth 2 (two) times daily.   melatonin 3 MG TABS tablet Take 1 tablet (3 mg total) by mouth at bedtime as needed.   metoprolol succinate (TOPROL-XL) 25 MG 24 hr tablet Take 1/2 tablet (12.5 mg total) by mouth every morning.   Multiple Vitamin (MULTIVITAMIN WITH MINERALS) TABS tablet Take 1 tablet by mouth daily.   polyethylene glycol (MIRALAX / GLYCOLAX) 17 g packet Take 17 g by mouth daily as needed for moderate constipation.   Rivaroxaban (XARELTO) 15 MG TABS tablet Take 1 tablet (15 mg total) by mouth daily with supper.   No facility-administered encounter medications on file as of 11/30/2020.   Thank you for the opportunity to participate in the care of Frances Kent.  The  palliative care team will continue to follow. Please call our office at [phone] if we can be of additional assistance.   Ezekiel Slocumb, NP   COVID-19 PATIENT SCREENING TOOL Asked and negative response unless otherwise noted:  Have you had symptoms of covid, tested positive or been in contact with someone with symptoms/positive test in the past 5-10 days? No

## 2020-12-01 ENCOUNTER — Other Ambulatory Visit (HOSPITAL_COMMUNITY): Payer: Self-pay

## 2020-12-01 DIAGNOSIS — I5023 Acute on chronic systolic (congestive) heart failure: Secondary | ICD-10-CM | POA: Diagnosis not present

## 2020-12-01 DIAGNOSIS — I4891 Unspecified atrial fibrillation: Secondary | ICD-10-CM | POA: Diagnosis not present

## 2020-12-01 DIAGNOSIS — F411 Generalized anxiety disorder: Secondary | ICD-10-CM | POA: Diagnosis not present

## 2020-12-01 DIAGNOSIS — E876 Hypokalemia: Secondary | ICD-10-CM | POA: Diagnosis not present

## 2020-12-01 DIAGNOSIS — N1831 Chronic kidney disease, stage 3a: Secondary | ICD-10-CM | POA: Diagnosis not present

## 2020-12-01 DIAGNOSIS — E034 Atrophy of thyroid (acquired): Secondary | ICD-10-CM | POA: Diagnosis not present

## 2020-12-01 DIAGNOSIS — J188 Other pneumonia, unspecified organism: Secondary | ICD-10-CM | POA: Diagnosis not present

## 2020-12-01 DIAGNOSIS — I69353 Hemiplegia and hemiparesis following cerebral infarction affecting right non-dominant side: Secondary | ICD-10-CM | POA: Diagnosis not present

## 2020-12-01 DIAGNOSIS — I130 Hypertensive heart and chronic kidney disease with heart failure and stage 1 through stage 4 chronic kidney disease, or unspecified chronic kidney disease: Secondary | ICD-10-CM | POA: Diagnosis not present

## 2020-12-02 ENCOUNTER — Other Ambulatory Visit: Payer: Self-pay | Admitting: Internal Medicine

## 2020-12-02 ENCOUNTER — Telehealth: Payer: Self-pay | Admitting: Internal Medicine

## 2020-12-02 NOTE — Telephone Encounter (Signed)
Reason for Triage Call /in person: Painful Hemorrhoids  Symptoms:Moderate  Pain Scale:dull pain, present - not adequately treated, location, Rectal area, and level of pain (1-10, 10 severe), 6/10  Onset: Patient started saying it hurts over the weekend, they have placed preperation H cream liberally on affected area with no relief. No rectal bleeding. Very painful to sit down. No fever/chills.  Duration: 5 days  Medications:Preperation H cream used but not effective. No other medication.  Last seen for this problem: Patient has stated she has seen PCP before but does not recall date. This is a chronic issue.  Outcome: Patient has appointment on Monday to see PCP but she feels she needs something to help now instead of waiting. Or is there a medication that may help over the counter. Patient would like PCP to instruct her on what to do next.

## 2020-12-02 NOTE — Telephone Encounter (Signed)
The patient's daughter called stating that her mother has been using preparation cream H. However, it is not helping her hemorrhoids. She has an appointment on  Monday 12/07/20. He daughter stated she will need medication before then.

## 2020-12-02 NOTE — Telephone Encounter (Signed)
Frances Kent has been informed and she stated she will head over to pharmacy to pick up the suggested medication.

## 2020-12-03 ENCOUNTER — Telehealth: Payer: Self-pay | Admitting: Internal Medicine

## 2020-12-03 DIAGNOSIS — I130 Hypertensive heart and chronic kidney disease with heart failure and stage 1 through stage 4 chronic kidney disease, or unspecified chronic kidney disease: Secondary | ICD-10-CM | POA: Diagnosis not present

## 2020-12-03 DIAGNOSIS — E034 Atrophy of thyroid (acquired): Secondary | ICD-10-CM | POA: Diagnosis not present

## 2020-12-03 DIAGNOSIS — N1831 Chronic kidney disease, stage 3a: Secondary | ICD-10-CM | POA: Diagnosis not present

## 2020-12-03 DIAGNOSIS — I4891 Unspecified atrial fibrillation: Secondary | ICD-10-CM | POA: Diagnosis not present

## 2020-12-03 DIAGNOSIS — E876 Hypokalemia: Secondary | ICD-10-CM | POA: Diagnosis not present

## 2020-12-03 DIAGNOSIS — F411 Generalized anxiety disorder: Secondary | ICD-10-CM | POA: Diagnosis not present

## 2020-12-03 DIAGNOSIS — I69353 Hemiplegia and hemiparesis following cerebral infarction affecting right non-dominant side: Secondary | ICD-10-CM | POA: Diagnosis not present

## 2020-12-03 DIAGNOSIS — J188 Other pneumonia, unspecified organism: Secondary | ICD-10-CM | POA: Diagnosis not present

## 2020-12-03 DIAGNOSIS — I5023 Acute on chronic systolic (congestive) heart failure: Secondary | ICD-10-CM | POA: Diagnosis not present

## 2020-12-03 NOTE — Telephone Encounter (Signed)
Patient informed, Due to the high volume of calls and your symptoms we have to forward your call to our Triage Nurse to expedient your call. Please hold for the transfer.  Patient transferred to [facility] at Access Nurse. Due to having a heart rate of 55 and systolic pressure of 49 the past two days.She has also not been feeling as active as usual.No openings in office or virtual for the patient to be evaluated.

## 2020-12-03 NOTE — Telephone Encounter (Signed)
Patient was instructed to call her cardiologist from Access nurse. Patient was also scheduled for an appointment later this week.

## 2020-12-07 ENCOUNTER — Telehealth: Payer: Self-pay

## 2020-12-07 ENCOUNTER — Encounter: Payer: Self-pay | Admitting: Internal Medicine

## 2020-12-07 ENCOUNTER — Ambulatory Visit (INDEPENDENT_AMBULATORY_CARE_PROVIDER_SITE_OTHER): Payer: Medicare HMO | Admitting: Internal Medicine

## 2020-12-07 ENCOUNTER — Other Ambulatory Visit: Payer: Self-pay

## 2020-12-07 VITALS — BP 124/52 | HR 59 | Temp 95.1°F | Ht 62.99 in | Wt 114.0 lb

## 2020-12-07 DIAGNOSIS — F5105 Insomnia due to other mental disorder: Secondary | ICD-10-CM

## 2020-12-07 DIAGNOSIS — N183 Chronic kidney disease, stage 3 unspecified: Secondary | ICD-10-CM

## 2020-12-07 DIAGNOSIS — E538 Deficiency of other specified B group vitamins: Secondary | ICD-10-CM | POA: Diagnosis not present

## 2020-12-07 DIAGNOSIS — Z8673 Personal history of transient ischemic attack (TIA), and cerebral infarction without residual deficits: Secondary | ICD-10-CM | POA: Diagnosis not present

## 2020-12-07 DIAGNOSIS — T3695XA Adverse effect of unspecified systemic antibiotic, initial encounter: Secondary | ICD-10-CM

## 2020-12-07 DIAGNOSIS — Z7901 Long term (current) use of anticoagulants: Secondary | ICD-10-CM

## 2020-12-07 DIAGNOSIS — D631 Anemia in chronic kidney disease: Secondary | ICD-10-CM | POA: Diagnosis not present

## 2020-12-07 DIAGNOSIS — I495 Sick sinus syndrome: Secondary | ICD-10-CM | POA: Diagnosis not present

## 2020-12-07 DIAGNOSIS — E440 Moderate protein-calorie malnutrition: Secondary | ICD-10-CM | POA: Diagnosis not present

## 2020-12-07 DIAGNOSIS — K521 Toxic gastroenteritis and colitis: Secondary | ICD-10-CM

## 2020-12-07 DIAGNOSIS — K644 Residual hemorrhoidal skin tags: Secondary | ICD-10-CM | POA: Diagnosis not present

## 2020-12-07 DIAGNOSIS — F409 Phobic anxiety disorder, unspecified: Secondary | ICD-10-CM

## 2020-12-07 LAB — CBC WITH DIFFERENTIAL/PLATELET
Basophils Absolute: 0.1 10*3/uL (ref 0.0–0.1)
Basophils Relative: 1.1 % (ref 0.0–3.0)
Eosinophils Absolute: 0 10*3/uL (ref 0.0–0.7)
Eosinophils Relative: 0.4 % (ref 0.0–5.0)
HCT: 29.4 % — ABNORMAL LOW (ref 36.0–46.0)
Hemoglobin: 9.2 g/dL — ABNORMAL LOW (ref 12.0–15.0)
Lymphocytes Relative: 10.6 % — ABNORMAL LOW (ref 12.0–46.0)
Lymphs Abs: 1 10*3/uL (ref 0.7–4.0)
MCHC: 31.4 g/dL (ref 30.0–36.0)
MCV: 84.6 fl (ref 78.0–100.0)
Monocytes Absolute: 1 10*3/uL (ref 0.1–1.0)
Monocytes Relative: 10.7 % (ref 3.0–12.0)
Neutro Abs: 7.5 10*3/uL (ref 1.4–7.7)
Neutrophils Relative %: 77.2 % — ABNORMAL HIGH (ref 43.0–77.0)
Platelets: 334 10*3/uL (ref 150.0–400.0)
RBC: 3.48 Mil/uL — ABNORMAL LOW (ref 3.87–5.11)
RDW: 17.6 % — ABNORMAL HIGH (ref 11.5–15.5)
WBC: 9.8 10*3/uL (ref 4.0–10.5)

## 2020-12-07 LAB — B12 AND FOLATE PANEL
Folate: >23.4 ng/mL (ref >5.9)
Vitamin B-12: >1550 pg/mL — ABNORMAL HIGH (ref 211–911)

## 2020-12-07 LAB — COMPREHENSIVE METABOLIC PANEL
ALT: 15 U/L (ref 0–35)
AST: 23 U/L (ref 0–37)
Albumin: 3.5 g/dL (ref 3.5–5.2)
Alkaline Phosphatase: 52 U/L (ref 39–117)
BUN: 32 mg/dL — ABNORMAL HIGH (ref 6–23)
CO2: 19 mEq/L (ref 19–32)
Calcium: 8.6 mg/dL (ref 8.4–10.5)
Chloride: 109 mEq/L (ref 96–112)
Creatinine, Ser: 1.25 mg/dL — ABNORMAL HIGH (ref 0.40–1.20)
GFR: 38.55 mL/min — ABNORMAL LOW (ref >60.00)
Glucose, Bld: 69 mg/dL — ABNORMAL LOW (ref 70–99)
Potassium: 5.2 mEq/L — ABNORMAL HIGH (ref 3.5–5.1)
Sodium: 137 mEq/L (ref 135–145)
Total Bilirubin: 0.6 mg/dL (ref 0.2–1.2)
Total Protein: 6.1 g/dL (ref 6.0–8.3)

## 2020-12-07 MED ORDER — MEGESTROL ACETATE 400 MG/10ML PO SUSP: 400.0000 mg | Freq: Two times a day (BID) | ORAL | 1 refills | Status: DC

## 2020-12-07 MED ORDER — ALPRAZOLAM 0.25 MG PO TABS: 0.2500 mg | ORAL_TABLET | Freq: Every evening | ORAL | 0 refills | Status: DC | PRN

## 2020-12-07 NOTE — Progress Notes (Signed)
Subjective:  Patient ID: Frances Kent, female    DOB: [birth year]  Age: 87 y.o. MRN: [id number]  CC: The primary encounter diagnosis was Malnutrition of moderate degree ([place]). Diagnoses of Anemia of chronic renal failure, stage 3 (moderate), unspecified whether stage 3a or 3b CKD ([place]), History of embolic stroke, V25 deficiency, Sick sinus syndrome ([place]), Antibiotic-associated diarrhea, Long term (current) use of anticoagulants, Inflamed external hemorrhoid, and Insomnia due to anxiety and fear were also pertinent to this visit.  HPI Frances Kent presents for  Chief Complaint  Patient presents with   Hospitalization Follow-up   This visit occurred during the SARS-CoV-2 public health emergency.  Safety protocols were in place, including screening questions prior to the visit, additional usage of staff PPE, and extensive cleaning of exam room while observing appropriate contact time as indicated for disinfecting solutions.    Frances Kent  is an 87 yr old female with chronic atrial fibrillation, symptomatic bradycardia, and unintentional weight loss of 30 lbs was admitted to [facility] on 10/30/2020 with fatigue, worsening of SOB and palpitations.   She was treated for fluid overload, started on IV antibiotics due to concerns of left lower lobe pneumonia and Cardizem and amiodarone for rate control. On 11/01/2020, she has near syncope with hypotension, EKG with prolonged QTC-588-stable and later that day developed right sided hemiparesis with aphasia, decrease in LOC with right hemianopsia. She was diagnosed with an embolic CVA, CTA showed left ICA occlusion into proximal M1 with distal reconstitution and was transferred to [facility] for cerebral angio with thrombectomy of L-ICA and L-MCA with TICI 3 by Dr. Earleen Newport.  Echocardiogram with ejection fraction of 40-45%, left atrial enlargement and moderate to severe mitral valve regurgitation. Follow up MRI/MRA brain showed tiny acute infarcts in left basal ganglia and  patent L-MCA.    Amiodarone was stopped  due to QT prolongation and Cardizem resumed PCCM She completed a  5 day course of ceftriaxone for treatment of CAP she was . Cleared for regular diet per bedside swallow.  Patient has been compliant with Xarelto but taking on empty stomach-->she was advised to take with dinner for better absorption.  Repeat EKG with QTC-492 and amiodarone resumed on 09/20 due to ongoing tachycardia. Her verbal output has improved with mild dysarthria and mild residual RUE hemiparesis.  DISCHARGED HOME ON MEGACE for weight loss.  Daughter has been instrumental in providing nutritional support and as a result,  patient has gained  GAINED 14 LBS.and appears much more robust than at her last visit. She is receiving home PT 2-3 TIMES PER WEEK.  Her strenth deficits, right sided,  have resolved.  Her dysarthria is not apparent today ,  but per her daugher,  becomes apparent by the end of the day  when she is tired/   She had several really good days last week with pulse in 60's .  Several days of bradycardia in the 40's and 50's, mostly are rest . Cardiologist following   Hemorrhoids itching and hurting despite using preparation H,  a topical preparation containing lidocaine .  Bowels moving several times daily , mostly  5 to 6 times daily after eating .  soft but solid    Outpatient Medications Prior to Visit  Medication Sig Dispense Refill   acetaminophen (TYLENOL) 500 MG tablet Take 500 mg by mouth every 6 (six) hours as needed.     amiodarone (PACERONE) 200 MG tablet Take 1/2 tablet (100 mg total) by mouth daily. 15 tablet 0  calcium carbonate (TUMS - DOSED IN MG ELEMENTAL CALCIUM) 500 MG chewable tablet Chew 1 tablet (200 mg of elemental calcium total) by mouth 2 (two) times daily with a meal.     ezetimibe (ZETIA) 10 MG tablet Take 1 tablet (10 mg total) by mouth daily. 30 tablet 0   famotidine (PEPCID) 20 MG tablet Take 1 tablet (20 mg total) by mouth daily. 30 tablet 0    furosemide (LASIX) 20 MG tablet Take 20 mg by mouth daily as needed.     ipratropium (ATROVENT) 0.02 % nebulizer solution Take 2.5 mLs (0.5 mg total) by nebulization every 6 (six) hours as needed for wheezing or shortness of breath (cough). 75 mL 12   levothyroxine (EUTHYROX) 50 MCG tablet Take 1 tablet (50 mcg total) by mouth daily at 6 (six) AM. 30 tablet 0   melatonin 3 MG TABS tablet Take 1 tablet (3 mg total) by mouth at bedtime as needed. 30 tablet 0   metoprolol succinate (TOPROL-XL) 25 MG 24 hr tablet Take 1/2 tablet (12.5 mg total) by mouth every morning. 15 tablet 0   Multiple Vitamin (MULTIVITAMIN WITH MINERALS) TABS tablet Take 1 tablet by mouth daily.     polyethylene glycol (MIRALAX / GLYCOLAX) 17 g packet Take 17 g by mouth daily as needed for moderate constipation. 14 each 0   Rivaroxaban (XARELTO) 15 MG TABS tablet Take 1 tablet (15 mg total) by mouth daily with supper. 30 tablet 0   megestrol (MEGACE) 400 MG/10ML suspension Take 10 mLs (400 mg total) by mouth 2 (two) times daily. 480 mL 0   No facility-administered medications prior to visit.    Review of Systems;  Patient denies headache, fevers, malaise, unintentional weight loss, skin rash, eye pain, sinus congestion and sinus pain, sore throat, dysphagia,  hemoptysis , cough, dyspnea, wheezing, chest pain, palpitations, orthopnea, edema, abdominal pain, nausea, melena, diarrhea, constipation, flank pain, dysuria, hematuria, urinary  Frequency, nocturia, numbness, tingling, seizures,  Focal weakness, Loss of consciousness,  Tremor, insomnia, depression, anxiety, and suicidal ideation.      Objective:  BP (!) 124/52   Pulse (!) 59   Temp (!) 95.1 F (35.1 C)   Ht 5' 2.99" (1.6 m)   Wt 114 lb (51.7 kg)   SpO2 97%   BMI 20.20 kg/m   BP Readings from Last 3 Encounters:  12/07/20 (!) 124/52  11/15/20 106/68  11/04/20 111/72    Wt Readings from Last 3 Encounters:  12/07/20 114 lb (51.7 kg)  11/15/20 95 lb 14.4 oz  (43.5 kg)  11/04/20 98 lb 1.7 oz (44.5 kg)    General appearance: alert, cooperative and appears stated age Ears: normal TM's and external ear canals both ears Throat: lips, mucosa, and tongue normal; teeth and gums normal Neck: no adenopathy, no carotid bruit, supple, symmetrical, trachea midline and thyroid not enlarged, symmetric, no tenderness/mass/nodules Back: symmetric, no curvature. ROM normal. No CVA tenderness. Lungs: clear to auscultation bilaterally Heart: regular rate and rhythm, S1, S2 normal, no murmur, click, rub or gallop Abdomen: soft, non-tender; bowel sounds normal; no masses,  no organomegaly Pulses: 2+ and symmetric Skin: Skin color, texture, turgor normal. No rashes or lesions Lymph nodes: Cervical, supraclavicular, and axillary nodes normal. Neuro:  awake and interactive with normal mood and affect. Higher cortical functions are normal. Speech is clear without word-finding difficulty or dysarthria. Extraocular movements are intact. Visual fields of both eyes are grossly intact. Sensation to light touch is grossly intact bilaterally of upper  and lower extremities. Motor examination shows 4+/5 symmetric hand grip and upper extremity and 5/5 lower extremity strength. There is no pronation or drift. Gait is non-ataxic   Lab Results  Component Value Date   HGBA1C 5.8 (H) 11/02/2020   HGBA1C 6.1 (H) 10/16/2020    Lab Results  Component Value Date   CREATININE 1.25 (H) 12/07/2020   CREATININE 0.90 11/12/2020   CREATININE 1.06 (H) 11/11/2020    Lab Results  Component Value Date   WBC 9.8 12/07/2020   HGB 9.2 Repeated and verified X2. (L) 12/07/2020   HCT 29.4 (L) 12/07/2020   PLT 334.0 12/07/2020   GLUCOSE 69 (L) 12/07/2020   CHOL 144 11/02/2020   TRIG 76 11/02/2020   TRIG 72 11/02/2020   HDL 48 11/02/2020   LDLDIRECT 185.3 03/27/2013   LDLCALC 81 11/02/2020   ALT 15 12/07/2020   AST 23 12/07/2020   NA 137 12/07/2020   K 5.2 slight hemolysis (H)  12/07/2020   CL 109 12/07/2020   CREATININE 1.25 (H) 12/07/2020   BUN 32 (H) 12/07/2020   CO2 19 12/07/2020   TSH 2.451 11/05/2020   INR 3.0 (H) 10/15/2020   HGBA1C 5.8 (H) 11/02/2020    No results found.  Assessment & Plan:   Problem List Items Addressed This Visit     B12 deficiency   Relevant Orders   B12 and Folate Panel (Completed)   Sick sinus syndrome ([place])    She remains symptomatic intermittently due to bradycardia iinto the 40's.  Has not had a syncopal event       Antibiotic-associated diarrhea    c dif suspected but not proven.  Testing was ordered in early September but not done . Marland Kitchen  Resolved.       Malnutrition of moderate degree ([place]) - Primary    Managed since discharge from hospital with megace.  14 lb weight gain .  Advised to continue medication given net loss of 16 lbs remaining       Relevant Orders   AMB Referral to Community Care Coordinaton   Prealbumin (Completed)   Comprehensive metabolic panel (Completed)   Long term (current) use of anticoagulants    Taking Eliquis .  hgb has been stable  Lab Results  Component Value Date   WBC 9.8 12/07/2020   HGB 9.2 Repeated and verified X2. (L) 12/07/2020   HCT 29.4 (L) 12/07/2020   MCV 84.6 12/07/2020   PLT 334.0 12/07/2020         History of embolic stroke    Occurred during recent hospitalization  Managed with urgent thrombectomy by Dr Earleen Newport at [facility]. Stroke invovled the Left ICA  With physical deficits resolving  On the right side and dysarthria mostly resolved. Continue anticoagulation and PT      Relevant Orders   AMB Referral to [facility] Coordinaton   Inflamed external hemorrhoid    supportive care outlined;  She defers surgical referral       Insomnia due to anxiety and fear    Advised to start melatonin at diner time and use 12.5 mg alprazolam prn.  The risks and benefits of benzodiazepine use were discussed with patient today including excessive sedation leading to respiratory  depression,  impaired thinking/driving, and addiction.  Patient was advised to avoid concurrent use with alcohol, to use medication only as needed and not to share with others  .       Other Visit Diagnoses     Anemia of chronic  renal failure, stage 3 (moderate), unspecified whether stage 3a or 3b CKD ([place])       Relevant Orders   AMB Referral to [facility]   CBC with Differential/Platelet (Completed)     . Meds ordered this encounter  Medications   megestrol (MEGACE) 400 MG/10ML suspension    Sig: Take 10 mLs (400 mg total) by mouth 2 (two) times daily.    Dispense:  600 mL    Refill:  1   ALPRAZolam (XANAX) 0.25 MG tablet    Sig: Take 1 tablet (0.25 mg total) by mouth at bedtime as needed for anxiety.    Dispense:  20 tablet    Refill:  0    Medications Discontinued During This Encounter  Medication Reason   megestrol (MEGACE) 400 MG/10ML suspension Reorder    Follow-up: No follow-ups on file.   [name], MD

## 2020-12-07 NOTE — Patient Instructions (Addendum)
Resume melatonin  and increase dose  to 5 mg at dinnertime  Ok to use 1/2 alprazolam at bedtime if not sleepy    Continue Megace:  you are still UNDERWEIGHT

## 2020-12-07 NOTE — Chronic Care Management (AMB) (Signed)
  Chronic Care Management   Outreach Note  12/07/2020 Name: Frances Kent MRN: [id number] DOB: 03/06/1930  Frances Kent is a 87 y.o. year old female who is a primary care patient of Derrel Nip, Aris Everts, MD. I reached out to Frances Kent by phone today in response to a referral sent by Ms. Tonye Pearson Coen's primary care provider.  An unsuccessful telephone outreach was attempted today. The patient was referred to the case management team for assistance with care management and care coordination.   Follow Up Plan: A HIPAA compliant phone message was left for the patient providing contact information and requesting a return call.  The care management team will reach out to the patient again over the next 5 days.  If patient returns call to provider office, please advise to call [facility] at [place], [place], [place], [place] [address] Direct Dial: [phone] [email or web address]

## 2020-12-08 DIAGNOSIS — I4891 Unspecified atrial fibrillation: Secondary | ICD-10-CM | POA: Diagnosis not present

## 2020-12-08 DIAGNOSIS — K644 Residual hemorrhoidal skin tags: Secondary | ICD-10-CM | POA: Insufficient documentation

## 2020-12-08 DIAGNOSIS — E876 Hypokalemia: Secondary | ICD-10-CM | POA: Diagnosis not present

## 2020-12-08 DIAGNOSIS — E034 Atrophy of thyroid (acquired): Secondary | ICD-10-CM | POA: Diagnosis not present

## 2020-12-08 DIAGNOSIS — N1831 Chronic kidney disease, stage 3a: Secondary | ICD-10-CM | POA: Diagnosis not present

## 2020-12-08 DIAGNOSIS — I130 Hypertensive heart and chronic kidney disease with heart failure and stage 1 through stage 4 chronic kidney disease, or unspecified chronic kidney disease: Secondary | ICD-10-CM | POA: Diagnosis not present

## 2020-12-08 DIAGNOSIS — Z8673 Personal history of transient ischemic attack (TIA), and cerebral infarction without residual deficits: Secondary | ICD-10-CM | POA: Insufficient documentation

## 2020-12-08 DIAGNOSIS — I5023 Acute on chronic systolic (congestive) heart failure: Secondary | ICD-10-CM | POA: Diagnosis not present

## 2020-12-08 DIAGNOSIS — F411 Generalized anxiety disorder: Secondary | ICD-10-CM | POA: Diagnosis not present

## 2020-12-08 DIAGNOSIS — F5105 Insomnia due to other mental disorder: Secondary | ICD-10-CM | POA: Insufficient documentation

## 2020-12-08 DIAGNOSIS — F409 Phobic anxiety disorder, unspecified: Secondary | ICD-10-CM | POA: Insufficient documentation

## 2020-12-08 DIAGNOSIS — I69353 Hemiplegia and hemiparesis following cerebral infarction affecting right non-dominant side: Secondary | ICD-10-CM | POA: Diagnosis not present

## 2020-12-08 DIAGNOSIS — J188 Other pneumonia, unspecified organism: Secondary | ICD-10-CM | POA: Diagnosis not present

## 2020-12-08 LAB — PREALBUMIN: Prealbumin: 30 mg/dL (ref 17–34)

## 2020-12-08 NOTE — Assessment & Plan Note (Signed)
Taking Eliquis .  hgb has been stable  Lab Results  Component Value Date   WBC 9.8 12/07/2020   HGB 9.2 Repeated and verified X2. (L) 12/07/2020   HCT 29.4 (L) 12/07/2020   MCV 84.6 12/07/2020   PLT 334.0 12/07/2020

## 2020-12-08 NOTE — Assessment & Plan Note (Signed)
She remains symptomatic intermittently due to bradycardia iinto the 40's.  Has not had a syncopal event

## 2020-12-08 NOTE — Assessment & Plan Note (Signed)
Advised to start melatonin at diner time and use 12.5 mg alprazolam prn.  The risks and benefits of benzodiazepine use were discussed with patient today including excessive sedation leading to respiratory depression,  impaired thinking/driving, and addiction.  Patient was advised to avoid concurrent use with alcohol, to use medication only as needed and not to share with others  .

## 2020-12-08 NOTE — Assessment & Plan Note (Addendum)
Occurred during recent hospitalization  Managed with urgent thrombectomy by Dr Earleen Newport at [facility]. Stroke invovled the Left ICA  With physical deficits resolving  On the right side and dysarthria mostly resolved. Continue anticoagulation and PT

## 2020-12-08 NOTE — Assessment & Plan Note (Addendum)
c dif suspected but not proven.  Testing was ordered in early September but not done . Marland Kitchen  Resolved.

## 2020-12-08 NOTE — Assessment & Plan Note (Signed)
supportive care outlined;  She defers surgical referral

## 2020-12-08 NOTE — Assessment & Plan Note (Signed)
Managed since discharge from hospital with megace.  14 lb weight gain .  Advised to continue medication given net loss of 16 lbs remaining

## 2020-12-09 DIAGNOSIS — E034 Atrophy of thyroid (acquired): Secondary | ICD-10-CM | POA: Diagnosis not present

## 2020-12-09 DIAGNOSIS — I69353 Hemiplegia and hemiparesis following cerebral infarction affecting right non-dominant side: Secondary | ICD-10-CM | POA: Diagnosis not present

## 2020-12-09 DIAGNOSIS — F411 Generalized anxiety disorder: Secondary | ICD-10-CM | POA: Diagnosis not present

## 2020-12-09 DIAGNOSIS — I5023 Acute on chronic systolic (congestive) heart failure: Secondary | ICD-10-CM | POA: Diagnosis not present

## 2020-12-09 DIAGNOSIS — N1831 Chronic kidney disease, stage 3a: Secondary | ICD-10-CM | POA: Diagnosis not present

## 2020-12-09 DIAGNOSIS — I4891 Unspecified atrial fibrillation: Secondary | ICD-10-CM | POA: Diagnosis not present

## 2020-12-09 DIAGNOSIS — J188 Other pneumonia, unspecified organism: Secondary | ICD-10-CM | POA: Diagnosis not present

## 2020-12-09 DIAGNOSIS — I130 Hypertensive heart and chronic kidney disease with heart failure and stage 1 through stage 4 chronic kidney disease, or unspecified chronic kidney disease: Secondary | ICD-10-CM | POA: Diagnosis not present

## 2020-12-09 DIAGNOSIS — E876 Hypokalemia: Secondary | ICD-10-CM | POA: Diagnosis not present

## 2020-12-09 NOTE — Chronic Care Management (AMB) (Signed)
  Chronic Care Management   Outreach Note  12/09/2020 Name: Frances Kent MRN: [id number] DOB: Sep 09, 1930  Frances Kent is a 87 y.o. year old female who is a primary care patient of Derrel Nip, Aris Everts, MD. I reached out to [name] by phone today in response to a referral sent by Ms. Tonye Pearson Vallee's primary care provider.  An unsuccessful telephone outreach was attempted today. The patient was referred to the case management team for assistance with care management and care coordination.   Follow Up Plan: A HIPAA compliant phone message was left for the patient providing contact information and requesting a return call.  The care management team will reach out to the patient again over the next 5 days.  If patient returns call to provider office, please advise to call [facility] at [place], [place], [place], [place] [address] Direct Dial: [phone] [email or web address]

## 2020-12-10 DIAGNOSIS — N1831 Chronic kidney disease, stage 3a: Secondary | ICD-10-CM | POA: Diagnosis not present

## 2020-12-10 DIAGNOSIS — E034 Atrophy of thyroid (acquired): Secondary | ICD-10-CM | POA: Diagnosis not present

## 2020-12-10 DIAGNOSIS — F411 Generalized anxiety disorder: Secondary | ICD-10-CM | POA: Diagnosis not present

## 2020-12-10 DIAGNOSIS — I130 Hypertensive heart and chronic kidney disease with heart failure and stage 1 through stage 4 chronic kidney disease, or unspecified chronic kidney disease: Secondary | ICD-10-CM | POA: Diagnosis not present

## 2020-12-10 DIAGNOSIS — J188 Other pneumonia, unspecified organism: Secondary | ICD-10-CM | POA: Diagnosis not present

## 2020-12-10 DIAGNOSIS — I4891 Unspecified atrial fibrillation: Secondary | ICD-10-CM | POA: Diagnosis not present

## 2020-12-10 DIAGNOSIS — E876 Hypokalemia: Secondary | ICD-10-CM | POA: Diagnosis not present

## 2020-12-10 DIAGNOSIS — I69353 Hemiplegia and hemiparesis following cerebral infarction affecting right non-dominant side: Secondary | ICD-10-CM | POA: Diagnosis not present

## 2020-12-10 DIAGNOSIS — I5023 Acute on chronic systolic (congestive) heart failure: Secondary | ICD-10-CM | POA: Diagnosis not present

## 2020-12-11 DIAGNOSIS — I5020 Unspecified systolic (congestive) heart failure: Secondary | ICD-10-CM | POA: Diagnosis not present

## 2020-12-11 DIAGNOSIS — I4892 Unspecified atrial flutter: Secondary | ICD-10-CM | POA: Diagnosis not present

## 2020-12-11 DIAGNOSIS — I480 Paroxysmal atrial fibrillation: Secondary | ICD-10-CM | POA: Diagnosis not present

## 2020-12-13 DIAGNOSIS — I5022 Chronic systolic (congestive) heart failure: Secondary | ICD-10-CM | POA: Diagnosis not present

## 2020-12-13 DIAGNOSIS — I639 Cerebral infarction, unspecified: Secondary | ICD-10-CM | POA: Diagnosis not present

## 2020-12-14 DIAGNOSIS — N1831 Chronic kidney disease, stage 3a: Secondary | ICD-10-CM | POA: Diagnosis not present

## 2020-12-14 DIAGNOSIS — E876 Hypokalemia: Secondary | ICD-10-CM | POA: Diagnosis not present

## 2020-12-14 DIAGNOSIS — J188 Other pneumonia, unspecified organism: Secondary | ICD-10-CM | POA: Diagnosis not present

## 2020-12-14 DIAGNOSIS — F411 Generalized anxiety disorder: Secondary | ICD-10-CM | POA: Diagnosis not present

## 2020-12-14 DIAGNOSIS — I4891 Unspecified atrial fibrillation: Secondary | ICD-10-CM | POA: Diagnosis not present

## 2020-12-14 DIAGNOSIS — E034 Atrophy of thyroid (acquired): Secondary | ICD-10-CM | POA: Diagnosis not present

## 2020-12-14 DIAGNOSIS — I5023 Acute on chronic systolic (congestive) heart failure: Secondary | ICD-10-CM | POA: Diagnosis not present

## 2020-12-14 DIAGNOSIS — I69353 Hemiplegia and hemiparesis following cerebral infarction affecting right non-dominant side: Secondary | ICD-10-CM | POA: Diagnosis not present

## 2020-12-14 DIAGNOSIS — I130 Hypertensive heart and chronic kidney disease with heart failure and stage 1 through stage 4 chronic kidney disease, or unspecified chronic kidney disease: Secondary | ICD-10-CM | POA: Diagnosis not present

## 2020-12-15 DIAGNOSIS — K649 Unspecified hemorrhoids: Secondary | ICD-10-CM | POA: Diagnosis not present

## 2020-12-15 DIAGNOSIS — N1831 Chronic kidney disease, stage 3a: Secondary | ICD-10-CM | POA: Diagnosis not present

## 2020-12-15 DIAGNOSIS — R1319 Other dysphagia: Secondary | ICD-10-CM | POA: Diagnosis not present

## 2020-12-15 DIAGNOSIS — I5023 Acute on chronic systolic (congestive) heart failure: Secondary | ICD-10-CM | POA: Diagnosis not present

## 2020-12-15 DIAGNOSIS — I130 Hypertensive heart and chronic kidney disease with heart failure and stage 1 through stage 4 chronic kidney disease, or unspecified chronic kidney disease: Secondary | ICD-10-CM | POA: Diagnosis not present

## 2020-12-15 DIAGNOSIS — I69353 Hemiplegia and hemiparesis following cerebral infarction affecting right non-dominant side: Secondary | ICD-10-CM | POA: Diagnosis not present

## 2020-12-15 DIAGNOSIS — E034 Atrophy of thyroid (acquired): Secondary | ICD-10-CM | POA: Diagnosis not present

## 2020-12-15 DIAGNOSIS — J188 Other pneumonia, unspecified organism: Secondary | ICD-10-CM | POA: Diagnosis not present

## 2020-12-15 DIAGNOSIS — K219 Gastro-esophageal reflux disease without esophagitis: Secondary | ICD-10-CM | POA: Diagnosis not present

## 2020-12-15 DIAGNOSIS — E876 Hypokalemia: Secondary | ICD-10-CM | POA: Diagnosis not present

## 2020-12-15 DIAGNOSIS — R682 Dry mouth, unspecified: Secondary | ICD-10-CM | POA: Diagnosis not present

## 2020-12-15 DIAGNOSIS — K449 Diaphragmatic hernia without obstruction or gangrene: Secondary | ICD-10-CM | POA: Diagnosis not present

## 2020-12-15 DIAGNOSIS — I4891 Unspecified atrial fibrillation: Secondary | ICD-10-CM | POA: Diagnosis not present

## 2020-12-15 DIAGNOSIS — K2960 Other gastritis without bleeding: Secondary | ICD-10-CM | POA: Diagnosis not present

## 2020-12-15 DIAGNOSIS — F411 Generalized anxiety disorder: Secondary | ICD-10-CM | POA: Diagnosis not present

## 2020-12-15 DIAGNOSIS — Z8673 Personal history of transient ischemic attack (TIA), and cerebral infarction without residual deficits: Secondary | ICD-10-CM | POA: Diagnosis not present

## 2020-12-16 DIAGNOSIS — I5023 Acute on chronic systolic (congestive) heart failure: Secondary | ICD-10-CM | POA: Diagnosis not present

## 2020-12-16 DIAGNOSIS — I130 Hypertensive heart and chronic kidney disease with heart failure and stage 1 through stage 4 chronic kidney disease, or unspecified chronic kidney disease: Secondary | ICD-10-CM | POA: Diagnosis not present

## 2020-12-16 DIAGNOSIS — I4891 Unspecified atrial fibrillation: Secondary | ICD-10-CM | POA: Diagnosis not present

## 2020-12-16 DIAGNOSIS — F411 Generalized anxiety disorder: Secondary | ICD-10-CM | POA: Diagnosis not present

## 2020-12-16 DIAGNOSIS — E034 Atrophy of thyroid (acquired): Secondary | ICD-10-CM | POA: Diagnosis not present

## 2020-12-16 DIAGNOSIS — J188 Other pneumonia, unspecified organism: Secondary | ICD-10-CM | POA: Diagnosis not present

## 2020-12-16 DIAGNOSIS — I69353 Hemiplegia and hemiparesis following cerebral infarction affecting right non-dominant side: Secondary | ICD-10-CM | POA: Diagnosis not present

## 2020-12-16 DIAGNOSIS — E876 Hypokalemia: Secondary | ICD-10-CM | POA: Diagnosis not present

## 2020-12-16 DIAGNOSIS — N1831 Chronic kidney disease, stage 3a: Secondary | ICD-10-CM | POA: Diagnosis not present

## 2020-12-17 NOTE — Chronic Care Management (AMB) (Signed)
  Chronic Care Management   Note  12/17/2020 Name: Frances Kent MRN: [id number] DOB: 1930/10/25  Frances Kent is a 87 y.o. year old female who is a primary care patient of Derrel Nip, Aris Everts, MD. I reached out to [name] by phone today in response to a referral sent by Frances Kent's PCP.  Frances Kent was given information about Chronic Care Management services today including:  CCM service includes personalized support from designated clinical staff supervised by her physician, including individualized plan of care and coordination with other care providers 24/7 contact phone numbers for assistance for urgent and routine care needs. Service will only be billed when office clinical staff spend 20 minutes or more in a month to coordinate care. Only one practitioner may furnish and bill the service in a calendar month. The patient may stop CCM services at any time (effective at the end of the month) by phone call to the office staff. The patient is responsible for co-pay (up to 20% after annual deductible is met) if co-pay is required by the individual health plan.   Patient agreed to services and verbal consent obtained.   Follow up plan: Telephone appointment with care management team member scheduled for:12/23/2020  Noreene Larsson, [place], [place], [place] [address] Direct Dial: [phone] [email or web address] Website: [email or web address]

## 2020-12-19 DIAGNOSIS — N1831 Chronic kidney disease, stage 3a: Secondary | ICD-10-CM | POA: Diagnosis not present

## 2020-12-19 DIAGNOSIS — I69353 Hemiplegia and hemiparesis following cerebral infarction affecting right non-dominant side: Secondary | ICD-10-CM | POA: Diagnosis not present

## 2020-12-19 DIAGNOSIS — I130 Hypertensive heart and chronic kidney disease with heart failure and stage 1 through stage 4 chronic kidney disease, or unspecified chronic kidney disease: Secondary | ICD-10-CM | POA: Diagnosis not present

## 2020-12-19 DIAGNOSIS — J188 Other pneumonia, unspecified organism: Secondary | ICD-10-CM | POA: Diagnosis not present

## 2020-12-19 DIAGNOSIS — F411 Generalized anxiety disorder: Secondary | ICD-10-CM | POA: Diagnosis not present

## 2020-12-19 DIAGNOSIS — I4891 Unspecified atrial fibrillation: Secondary | ICD-10-CM | POA: Diagnosis not present

## 2020-12-19 DIAGNOSIS — E034 Atrophy of thyroid (acquired): Secondary | ICD-10-CM | POA: Diagnosis not present

## 2020-12-19 DIAGNOSIS — I5023 Acute on chronic systolic (congestive) heart failure: Secondary | ICD-10-CM | POA: Diagnosis not present

## 2020-12-19 DIAGNOSIS — E876 Hypokalemia: Secondary | ICD-10-CM | POA: Diagnosis not present

## 2020-12-22 DIAGNOSIS — I4891 Unspecified atrial fibrillation: Secondary | ICD-10-CM | POA: Diagnosis not present

## 2020-12-22 DIAGNOSIS — I5023 Acute on chronic systolic (congestive) heart failure: Secondary | ICD-10-CM | POA: Diagnosis not present

## 2020-12-22 DIAGNOSIS — F411 Generalized anxiety disorder: Secondary | ICD-10-CM | POA: Diagnosis not present

## 2020-12-22 DIAGNOSIS — I69353 Hemiplegia and hemiparesis following cerebral infarction affecting right non-dominant side: Secondary | ICD-10-CM | POA: Diagnosis not present

## 2020-12-22 DIAGNOSIS — N1831 Chronic kidney disease, stage 3a: Secondary | ICD-10-CM | POA: Diagnosis not present

## 2020-12-22 DIAGNOSIS — I130 Hypertensive heart and chronic kidney disease with heart failure and stage 1 through stage 4 chronic kidney disease, or unspecified chronic kidney disease: Secondary | ICD-10-CM | POA: Diagnosis not present

## 2020-12-22 DIAGNOSIS — J188 Other pneumonia, unspecified organism: Secondary | ICD-10-CM | POA: Diagnosis not present

## 2020-12-22 DIAGNOSIS — E876 Hypokalemia: Secondary | ICD-10-CM | POA: Diagnosis not present

## 2020-12-22 DIAGNOSIS — E034 Atrophy of thyroid (acquired): Secondary | ICD-10-CM | POA: Diagnosis not present

## 2020-12-23 ENCOUNTER — Ambulatory Visit (INDEPENDENT_AMBULATORY_CARE_PROVIDER_SITE_OTHER): Payer: Medicare HMO | Admitting: *Deleted

## 2020-12-23 DIAGNOSIS — F411 Generalized anxiety disorder: Secondary | ICD-10-CM | POA: Diagnosis not present

## 2020-12-23 DIAGNOSIS — I5023 Acute on chronic systolic (congestive) heart failure: Secondary | ICD-10-CM | POA: Diagnosis not present

## 2020-12-23 DIAGNOSIS — E876 Hypokalemia: Secondary | ICD-10-CM | POA: Diagnosis not present

## 2020-12-23 DIAGNOSIS — Z8673 Personal history of transient ischemic attack (TIA), and cerebral infarction without residual deficits: Secondary | ICD-10-CM

## 2020-12-23 DIAGNOSIS — N1831 Chronic kidney disease, stage 3a: Secondary | ICD-10-CM | POA: Diagnosis not present

## 2020-12-23 DIAGNOSIS — I4891 Unspecified atrial fibrillation: Secondary | ICD-10-CM | POA: Diagnosis not present

## 2020-12-23 DIAGNOSIS — I69353 Hemiplegia and hemiparesis following cerebral infarction affecting right non-dominant side: Secondary | ICD-10-CM | POA: Diagnosis not present

## 2020-12-23 DIAGNOSIS — I5022 Chronic systolic (congestive) heart failure: Secondary | ICD-10-CM

## 2020-12-23 DIAGNOSIS — R000 Tachycardia, unspecified: Secondary | ICD-10-CM

## 2020-12-23 DIAGNOSIS — I130 Hypertensive heart and chronic kidney disease with heart failure and stage 1 through stage 4 chronic kidney disease, or unspecified chronic kidney disease: Secondary | ICD-10-CM | POA: Diagnosis not present

## 2020-12-23 DIAGNOSIS — E034 Atrophy of thyroid (acquired): Secondary | ICD-10-CM | POA: Diagnosis not present

## 2020-12-23 DIAGNOSIS — J188 Other pneumonia, unspecified organism: Secondary | ICD-10-CM | POA: Diagnosis not present

## 2020-12-23 NOTE — Patient Instructions (Signed)
Visit Information  Thank you for allowing me to share the care management and care coordination services that are available to you as part of your health plan and services through your primary care provider and medical home. Please reach out to me at [phone] if the care management/care coordination team may be of assistance to you in the future.   Hubert Azure RN, MSN RN Care Management Coordinator [place] [phone] [email or web address]

## 2020-12-23 NOTE — Chronic Care Management (AMB) (Addendum)
  Care Management   Follow Up Note   12/23/2020 Name: Frances Kent MRN: [id number] DOB: [birth year]   Referred by: [name], MD Reason for referral : Chronic Care Management (Case Closure)   Successful contact was made with the patient to discuss care management and care coordination services. Patient declines engagement at this time.   Follow Up Plan: No further follow up required: as patient and daughter  report duplicate of services with [employer], Palliative, and home health.  Hubert Azure RN, MSN RN Care Management Coordinator [place] [phone] [email or web address]

## 2020-12-24 DIAGNOSIS — E034 Atrophy of thyroid (acquired): Secondary | ICD-10-CM | POA: Diagnosis not present

## 2020-12-24 DIAGNOSIS — N1831 Chronic kidney disease, stage 3a: Secondary | ICD-10-CM | POA: Diagnosis not present

## 2020-12-24 DIAGNOSIS — I130 Hypertensive heart and chronic kidney disease with heart failure and stage 1 through stage 4 chronic kidney disease, or unspecified chronic kidney disease: Secondary | ICD-10-CM | POA: Diagnosis not present

## 2020-12-24 DIAGNOSIS — J188 Other pneumonia, unspecified organism: Secondary | ICD-10-CM | POA: Diagnosis not present

## 2020-12-24 DIAGNOSIS — I4891 Unspecified atrial fibrillation: Secondary | ICD-10-CM | POA: Diagnosis not present

## 2020-12-24 DIAGNOSIS — I69353 Hemiplegia and hemiparesis following cerebral infarction affecting right non-dominant side: Secondary | ICD-10-CM | POA: Diagnosis not present

## 2020-12-24 DIAGNOSIS — E876 Hypokalemia: Secondary | ICD-10-CM | POA: Diagnosis not present

## 2020-12-24 DIAGNOSIS — F411 Generalized anxiety disorder: Secondary | ICD-10-CM | POA: Diagnosis not present

## 2020-12-24 DIAGNOSIS — I5023 Acute on chronic systolic (congestive) heart failure: Secondary | ICD-10-CM | POA: Diagnosis not present

## 2020-12-28 DIAGNOSIS — F411 Generalized anxiety disorder: Secondary | ICD-10-CM | POA: Diagnosis not present

## 2020-12-28 DIAGNOSIS — I130 Hypertensive heart and chronic kidney disease with heart failure and stage 1 through stage 4 chronic kidney disease, or unspecified chronic kidney disease: Secondary | ICD-10-CM | POA: Diagnosis not present

## 2020-12-28 DIAGNOSIS — N1831 Chronic kidney disease, stage 3a: Secondary | ICD-10-CM | POA: Diagnosis not present

## 2020-12-28 DIAGNOSIS — I5023 Acute on chronic systolic (congestive) heart failure: Secondary | ICD-10-CM | POA: Diagnosis not present

## 2020-12-28 DIAGNOSIS — E876 Hypokalemia: Secondary | ICD-10-CM | POA: Diagnosis not present

## 2020-12-28 DIAGNOSIS — I69353 Hemiplegia and hemiparesis following cerebral infarction affecting right non-dominant side: Secondary | ICD-10-CM | POA: Diagnosis not present

## 2020-12-28 DIAGNOSIS — J188 Other pneumonia, unspecified organism: Secondary | ICD-10-CM | POA: Diagnosis not present

## 2020-12-28 DIAGNOSIS — I4891 Unspecified atrial fibrillation: Secondary | ICD-10-CM | POA: Diagnosis not present

## 2020-12-28 DIAGNOSIS — E034 Atrophy of thyroid (acquired): Secondary | ICD-10-CM | POA: Diagnosis not present

## 2020-12-29 DIAGNOSIS — H353221 Exudative age-related macular degeneration, left eye, with active choroidal neovascularization: Secondary | ICD-10-CM | POA: Diagnosis not present

## 2020-12-29 DIAGNOSIS — I69398 Other sequelae of cerebral infarction: Secondary | ICD-10-CM | POA: Diagnosis not present

## 2020-12-31 DIAGNOSIS — E782 Mixed hyperlipidemia: Secondary | ICD-10-CM | POA: Diagnosis not present

## 2020-12-31 DIAGNOSIS — I700 Atherosclerosis of aorta: Secondary | ICD-10-CM | POA: Diagnosis not present

## 2020-12-31 DIAGNOSIS — I10 Essential (primary) hypertension: Secondary | ICD-10-CM | POA: Diagnosis not present

## 2020-12-31 DIAGNOSIS — I480 Paroxysmal atrial fibrillation: Secondary | ICD-10-CM | POA: Diagnosis not present

## 2020-12-31 DIAGNOSIS — I6523 Occlusion and stenosis of bilateral carotid arteries: Secondary | ICD-10-CM | POA: Diagnosis not present

## 2021-01-08 DIAGNOSIS — F411 Generalized anxiety disorder: Secondary | ICD-10-CM | POA: Diagnosis not present

## 2021-01-08 DIAGNOSIS — E876 Hypokalemia: Secondary | ICD-10-CM | POA: Diagnosis not present

## 2021-01-08 DIAGNOSIS — N1831 Chronic kidney disease, stage 3a: Secondary | ICD-10-CM | POA: Diagnosis not present

## 2021-01-08 DIAGNOSIS — I4891 Unspecified atrial fibrillation: Secondary | ICD-10-CM | POA: Diagnosis not present

## 2021-01-08 DIAGNOSIS — I69353 Hemiplegia and hemiparesis following cerebral infarction affecting right non-dominant side: Secondary | ICD-10-CM | POA: Diagnosis not present

## 2021-01-08 DIAGNOSIS — J188 Other pneumonia, unspecified organism: Secondary | ICD-10-CM | POA: Diagnosis not present

## 2021-01-08 DIAGNOSIS — E034 Atrophy of thyroid (acquired): Secondary | ICD-10-CM | POA: Diagnosis not present

## 2021-01-08 DIAGNOSIS — I5023 Acute on chronic systolic (congestive) heart failure: Secondary | ICD-10-CM | POA: Diagnosis not present

## 2021-01-08 DIAGNOSIS — I130 Hypertensive heart and chronic kidney disease with heart failure and stage 1 through stage 4 chronic kidney disease, or unspecified chronic kidney disease: Secondary | ICD-10-CM | POA: Diagnosis not present

## 2021-01-12 ENCOUNTER — Other Ambulatory Visit: Payer: Self-pay

## 2021-01-12 ENCOUNTER — Inpatient Hospital Stay
Admission: EM | Admit: 2021-01-12 | Discharge: 2021-01-15 | DRG: 308 | Disposition: A | Discharge status: Home Health | Payer: Medicare HMO | Attending: Student | Admitting: Student

## 2021-01-12 ENCOUNTER — Emergency Department: Payer: Medicare HMO

## 2021-01-12 DIAGNOSIS — I071 Rheumatic tricuspid insufficiency: Secondary | ICD-10-CM | POA: Diagnosis not present

## 2021-01-12 DIAGNOSIS — Z888 Allergy status to other drugs, medicaments and biological substances status: Secondary | ICD-10-CM

## 2021-01-12 DIAGNOSIS — Z79890 Hormone replacement therapy (postmenopausal): Secondary | ICD-10-CM | POA: Diagnosis not present

## 2021-01-12 DIAGNOSIS — Z823 Family history of stroke: Secondary | ICD-10-CM

## 2021-01-12 DIAGNOSIS — I509 Heart failure, unspecified: Secondary | ICD-10-CM

## 2021-01-12 DIAGNOSIS — D649 Anemia, unspecified: Secondary | ICD-10-CM | POA: Diagnosis not present

## 2021-01-12 DIAGNOSIS — I340 Nonrheumatic mitral (valve) insufficiency: Secondary | ICD-10-CM | POA: Diagnosis not present

## 2021-01-12 DIAGNOSIS — I110 Hypertensive heart disease with heart failure: Secondary | ICD-10-CM | POA: Diagnosis not present

## 2021-01-12 DIAGNOSIS — N1831 Chronic kidney disease, stage 3a: Secondary | ICD-10-CM | POA: Diagnosis present

## 2021-01-12 DIAGNOSIS — R000 Tachycardia, unspecified: Secondary | ICD-10-CM | POA: Diagnosis not present

## 2021-01-12 DIAGNOSIS — D509 Iron deficiency anemia, unspecified: Secondary | ICD-10-CM | POA: Diagnosis present

## 2021-01-12 DIAGNOSIS — Z79899 Other long term (current) drug therapy: Secondary | ICD-10-CM | POA: Diagnosis not present

## 2021-01-12 DIAGNOSIS — J90 Pleural effusion, not elsewhere classified: Secondary | ICD-10-CM | POA: Diagnosis not present

## 2021-01-12 DIAGNOSIS — E785 Hyperlipidemia, unspecified: Secondary | ICD-10-CM | POA: Diagnosis present

## 2021-01-12 DIAGNOSIS — E039 Hypothyroidism, unspecified: Secondary | ICD-10-CM | POA: Diagnosis present

## 2021-01-12 DIAGNOSIS — Z8673 Personal history of transient ischemic attack (TIA), and cerebral infarction without residual deficits: Secondary | ICD-10-CM

## 2021-01-12 DIAGNOSIS — Z8249 Family history of ischemic heart disease and other diseases of the circulatory system: Secondary | ICD-10-CM | POA: Diagnosis not present

## 2021-01-12 DIAGNOSIS — I480 Paroxysmal atrial fibrillation: Principal | ICD-10-CM | POA: Diagnosis present

## 2021-01-12 DIAGNOSIS — R1310 Dysphagia, unspecified: Secondary | ICD-10-CM | POA: Diagnosis not present

## 2021-01-12 DIAGNOSIS — I4891 Unspecified atrial fibrillation: Secondary | ICD-10-CM | POA: Diagnosis not present

## 2021-01-12 DIAGNOSIS — R636 Underweight: Secondary | ICD-10-CM | POA: Diagnosis not present

## 2021-01-12 DIAGNOSIS — I130 Hypertensive heart and chronic kidney disease with heart failure and stage 1 through stage 4 chronic kidney disease, or unspecified chronic kidney disease: Secondary | ICD-10-CM | POA: Diagnosis present

## 2021-01-12 DIAGNOSIS — Z86711 Personal history of pulmonary embolism: Secondary | ICD-10-CM

## 2021-01-12 DIAGNOSIS — I452 Bifascicular block: Secondary | ICD-10-CM | POA: Diagnosis present

## 2021-01-12 DIAGNOSIS — R5381 Other malaise: Secondary | ICD-10-CM | POA: Diagnosis not present

## 2021-01-12 DIAGNOSIS — I081 Rheumatic disorders of both mitral and tricuspid valves: Secondary | ICD-10-CM | POA: Diagnosis present

## 2021-01-12 DIAGNOSIS — Z800 Family history of malignant neoplasm of digestive organs: Secondary | ICD-10-CM

## 2021-01-12 DIAGNOSIS — Z803 Family history of malignant neoplasm of breast: Secondary | ICD-10-CM | POA: Diagnosis not present

## 2021-01-12 DIAGNOSIS — Z20822 Contact with and (suspected) exposure to covid-19: Secondary | ICD-10-CM | POA: Diagnosis present

## 2021-01-12 DIAGNOSIS — I160 Hypertensive urgency: Secondary | ICD-10-CM | POA: Diagnosis present

## 2021-01-12 DIAGNOSIS — H3530 Unspecified macular degeneration: Secondary | ICD-10-CM | POA: Diagnosis present

## 2021-01-12 DIAGNOSIS — I5023 Acute on chronic systolic (congestive) heart failure: Secondary | ICD-10-CM

## 2021-01-12 DIAGNOSIS — I4819 Other persistent atrial fibrillation: Secondary | ICD-10-CM | POA: Diagnosis present

## 2021-01-12 DIAGNOSIS — I471 Supraventricular tachycardia: Secondary | ICD-10-CM | POA: Diagnosis present

## 2021-01-12 DIAGNOSIS — R079 Chest pain, unspecified: Secondary | ICD-10-CM | POA: Diagnosis not present

## 2021-01-12 DIAGNOSIS — Z7901 Long term (current) use of anticoagulants: Secondary | ICD-10-CM | POA: Diagnosis not present

## 2021-01-12 DIAGNOSIS — K219 Gastro-esophageal reflux disease without esophagitis: Secondary | ICD-10-CM | POA: Diagnosis present

## 2021-01-12 DIAGNOSIS — Z8679 Personal history of other diseases of the circulatory system: Secondary | ICD-10-CM | POA: Diagnosis not present

## 2021-01-12 DIAGNOSIS — I482 Chronic atrial fibrillation, unspecified: Secondary | ICD-10-CM | POA: Diagnosis not present

## 2021-01-12 DIAGNOSIS — R0602 Shortness of breath: Secondary | ICD-10-CM | POA: Diagnosis present

## 2021-01-12 LAB — CBC
HCT: 25.9 % — ABNORMAL LOW (ref 36.0–46.0)
HCT: 27.4 % — ABNORMAL LOW (ref 36.0–46.0)
Hemoglobin: 7.7 g/dL — ABNORMAL LOW (ref 12.0–15.0)
Hemoglobin: 8.2 g/dL — ABNORMAL LOW (ref 12.0–15.0)
MCH: 24.6 pg — ABNORMAL LOW (ref 26.0–34.0)
MCH: 24.7 pg — ABNORMAL LOW (ref 26.0–34.0)
MCHC: 29.7 g/dL — ABNORMAL LOW (ref 30.0–36.0)
MCHC: 29.9 g/dL — ABNORMAL LOW (ref 30.0–36.0)
MCV: 82.7 fL (ref 80.0–100.0)
MCV: 82.5 fL (ref 80.0–100.0)
Platelets: 509 10*3/uL — ABNORMAL HIGH (ref 150–400)
Platelets: 457 10*3/uL — ABNORMAL HIGH (ref 150–400)
RBC: 3.13 MIL/uL — ABNORMAL LOW (ref 3.87–5.11)
RBC: 3.32 MIL/uL — ABNORMAL LOW (ref 3.87–5.11)
RDW: 17.9 % — ABNORMAL HIGH (ref 11.5–15.5)
RDW: 17.9 % — ABNORMAL HIGH (ref 11.5–15.5)
WBC: 7.1 10*3/uL (ref 4.0–10.5)
WBC: 6.1 10*3/uL (ref 4.0–10.5)
nRBC: 0 % (ref 0.0–0.2)
nRBC: 0 % (ref 0.0–0.2)

## 2021-01-12 LAB — BASIC METABOLIC PANEL
Anion gap: 6 (ref 5–15)
BUN: 18 mg/dL (ref 8–23)
CO2: 20 mmol/L — ABNORMAL LOW (ref 22–32)
Calcium: 8.7 mg/dL — ABNORMAL LOW (ref 8.9–10.3)
Chloride: 111 mmol/L (ref 98–111)
Creatinine, Ser: 1.11 mg/dL — ABNORMAL HIGH (ref 0.44–1.00)
GFR, Estimated: 48 mL/min — ABNORMAL LOW (ref >60)
Glucose, Bld: 98 mg/dL (ref 70–99)
Potassium: 4.5 mmol/L (ref 3.5–5.1)
Sodium: 137 mmol/L (ref 135–145)

## 2021-01-12 LAB — HEPATIC FUNCTION PANEL
ALT: 56 U/L — ABNORMAL HIGH (ref 0–44)
AST: 52 U/L — ABNORMAL HIGH (ref 15–41)
Albumin: 3.3 g/dL — ABNORMAL LOW (ref 3.5–5.0)
Alkaline Phosphatase: 96 U/L (ref 38–126)
Bilirubin, Direct: 0.2 mg/dL (ref 0.0–0.2)
Indirect Bilirubin: 0.8 mg/dL (ref 0.3–0.9)
Total Bilirubin: 1 mg/dL (ref 0.3–1.2)
Total Protein: 6.7 g/dL (ref 6.5–8.1)

## 2021-01-12 LAB — RESP PANEL BY RT-PCR (FLU A&B, COVID) ARPGX2
Influenza A by PCR: NEGATIVE
Influenza B by PCR: NEGATIVE
SARS Coronavirus 2 by RT PCR: NEGATIVE

## 2021-01-12 LAB — IRON AND TIBC
Iron: 24 ug/dL — ABNORMAL LOW (ref 28–170)
Saturation Ratios: 5 % — ABNORMAL LOW (ref 10.4–31.8)
TIBC: 486 ug/dL — ABNORMAL HIGH (ref 250–450)
UIBC: 462 ug/dL

## 2021-01-12 LAB — RETICULOCYTES
Immature Retic Fract: 30.8 % — ABNORMAL HIGH (ref 2.3–15.9)
RBC.: 3.35 MIL/uL — ABNORMAL LOW (ref 3.87–5.11)
Retic Count, Absolute: 71.7 10*3/uL (ref 19.0–186.0)
Retic Ct Pct: 2.1 % (ref 0.4–3.1)

## 2021-01-12 LAB — FOLATE: Folate: 30 ng/mL (ref >5.9)

## 2021-01-12 LAB — CREATININE, SERUM
Creatinine, Ser: 1.18 mg/dL — ABNORMAL HIGH (ref 0.44–1.00)
GFR, Estimated: 44 mL/min — ABNORMAL LOW (ref >60)

## 2021-01-12 LAB — BRAIN NATRIURETIC PEPTIDE: B Natriuretic Peptide: 1879.4 pg/mL — ABNORMAL HIGH (ref 0.0–100.0)

## 2021-01-12 LAB — FERRITIN: Ferritin: 31 ng/mL (ref 11–307)

## 2021-01-12 LAB — VITAMIN B12: Vitamin B-12: 394 pg/mL (ref 180–914)

## 2021-01-12 LAB — TROPONIN I (HIGH SENSITIVITY)
Troponin I (High Sensitivity): 27 ng/L — ABNORMAL HIGH (ref <18)
Troponin I (High Sensitivity): 25 ng/L — ABNORMAL HIGH (ref <18)

## 2021-01-12 MED ORDER — RIVAROXABAN 15 MG PO TABS
15.0000 mg | ORAL_TABLET | Freq: Every day | ORAL | Status: DC
  Administered 2021-01-12 – 2021-01-14 (×3): 15 mg via ORAL
  Filled 2021-01-12 (×4): qty 1

## 2021-01-12 MED ORDER — CALCIUM CARBONATE ANTACID 500 MG PO CHEW
1.0000 | CHEWABLE_TABLET | Freq: Two times a day (BID) | ORAL | Status: DC
  Administered 2021-01-13 – 2021-01-15 (×5): 200 mg via ORAL
  Filled 2021-01-12 (×5): qty 1

## 2021-01-12 MED ORDER — ONDANSETRON HCL 4 MG PO TABS: 4.0000 mg | ORAL_TABLET | Freq: Four times a day (QID) | ORAL | Status: DC | PRN

## 2021-01-12 MED ORDER — ALPRAZOLAM 0.25 MG PO TABS: 0.2500 mg | ORAL_TABLET | Freq: Every evening | ORAL | Status: DC | PRN

## 2021-01-12 MED ORDER — IPRATROPIUM BROMIDE 0.02 % IN SOLN: 0.5000 mg | Freq: Four times a day (QID) | RESPIRATORY_TRACT | Status: DC | PRN

## 2021-01-12 MED ORDER — LEVOTHYROXINE SODIUM 50 MCG PO TABS
50.0000 ug | ORAL_TABLET | Freq: Every day | ORAL | Status: DC
  Administered 2021-01-13 – 2021-01-15 (×3): 50 ug via ORAL
  Filled 2021-01-12 (×3): qty 1

## 2021-01-12 MED ORDER — LABETALOL HCL 5 MG/ML IV SOLN: 20.0000 mg | INTRAVENOUS | Status: DC | PRN

## 2021-01-12 MED ORDER — FUROSEMIDE 10 MG/ML IJ SOLN
40.0000 mg | Freq: Two times a day (BID) | INTRAMUSCULAR | Status: DC
  Administered 2021-01-13: 40 mg via INTRAVENOUS
  Filled 2021-01-12: qty 4

## 2021-01-12 MED ORDER — MAGNESIUM HYDROXIDE 400 MG/5ML PO SUSP: 30.0000 mL | Freq: Every day | ORAL | Status: DC | PRN

## 2021-01-12 MED ORDER — ACETAMINOPHEN 650 MG RE SUPP
650.0000 mg | Freq: Four times a day (QID) | RECTAL | Status: DC | PRN
  Filled 2021-01-12: qty 1

## 2021-01-12 MED ORDER — METOPROLOL SUCCINATE ER 25 MG PO TB24
12.5000 mg | ORAL_TABLET | Freq: Every morning | ORAL | Status: DC
  Administered 2021-01-13 – 2021-01-15 (×3): 12.5 mg via ORAL
  Filled 2021-01-12 (×3): qty 1

## 2021-01-12 MED ORDER — AMIODARONE HCL 200 MG PO TABS
100.0000 mg | ORAL_TABLET | Freq: Every day | ORAL | Status: DC
  Administered 2021-01-13 – 2021-01-15 (×3): 100 mg via ORAL
  Filled 2021-01-12 (×3): qty 1

## 2021-01-12 MED ORDER — TRAZODONE HCL 50 MG PO TABS: 25.0000 mg | ORAL_TABLET | Freq: Every evening | ORAL | Status: DC | PRN

## 2021-01-12 MED ORDER — POLYETHYLENE GLYCOL 3350 17 G PO PACK: 17.0000 g | PACK | Freq: Every day | ORAL | Status: DC | PRN

## 2021-01-12 MED ORDER — ADULT MULTIVITAMIN W/MINERALS CH
1.0000 | ORAL_TABLET | Freq: Every day | ORAL | Status: DC
  Administered 2021-01-13 – 2021-01-15 (×3): 1 via ORAL
  Filled 2021-01-12 (×3): qty 1

## 2021-01-12 MED ORDER — RIVAROXABAN 20 MG PO TABS
ORAL_TABLET | ORAL | Status: AC
  Filled 2021-01-12: qty 1

## 2021-01-12 MED ORDER — DILTIAZEM HCL-DEXTROSE 125-5 MG/125ML-% IV SOLN (PREMIX)
5.0000 mg/h | INTRAVENOUS | Status: DC
  Administered 2021-01-12: 5 mg/h via INTRAVENOUS
  Filled 2021-01-12: qty 125

## 2021-01-12 MED ORDER — ALPRAZOLAM 0.25 MG PO TABS
ORAL_TABLET | ORAL | Status: AC
  Administered 2021-01-12: 0.25 mg
  Filled 2021-01-12: qty 1

## 2021-01-12 MED ORDER — FUROSEMIDE 10 MG/ML IJ SOLN
20.0000 mg | Freq: Once | INTRAMUSCULAR | Status: AC
  Administered 2021-01-12: 20 mg via INTRAVENOUS
  Filled 2021-01-12: qty 4

## 2021-01-12 MED ORDER — ONDANSETRON HCL 4 MG/2ML IJ SOLN: 4.0000 mg | Freq: Four times a day (QID) | INTRAMUSCULAR | Status: DC | PRN

## 2021-01-12 MED ORDER — ACETAMINOPHEN 325 MG PO TABS: 650.0000 mg | ORAL_TABLET | Freq: Four times a day (QID) | ORAL | Status: DC | PRN

## 2021-01-12 MED ORDER — ENOXAPARIN SODIUM 30 MG/0.3ML IJ SOSY: 30.0000 mg | PREFILLED_SYRINGE | INTRAMUSCULAR | Status: DC

## 2021-01-12 MED ORDER — DILTIAZEM LOAD VIA INFUSION
10.0000 mg | Freq: Once | INTRAVENOUS | Status: AC
  Administered 2021-01-12: 10 mg via INTRAVENOUS
  Filled 2021-01-12: qty 10

## 2021-01-12 NOTE — ED Triage Notes (Signed)
Pt to ED for [facility] that started last night and chest pressure. Pt ST with HR 140s on arrival. Speaking in short sentences. 96% on RA

## 2021-01-12 NOTE — H&P (Addendum)
Chronic     [place]   PATIENT NAME: Frances Kent    MR#:  [id number]  DATE OF BIRTH:  08/07/1930  DATE OF ADMISSION:  01/12/2021  PRIMARY CARE PHYSICIAN: [name], MD   Patient is coming from: Home  REQUESTING/REFERRING PHYSICIAN: [name], MD  CHIEF COMPLAINT:   Chief Complaint  Patient presents with  . Shortness of Breath    HISTORY OF PRESENT ILLNESS:  Frances Kent is a 87 y.o. Caucasian female with medical history significant for paroxysmal atrial fibrillation, GERD, hypertension, hypothyroidism, CHF, PE and SVT, presented to the ER with acute onset of worsening dyspnea since last night.  She has been having intermittent dyspnea since September.  She admits to dry cough with her dyspnea without wheezing.  She denies any chest pain or palpitations.  No fever or chills.  No nausea or vomiting or abdominal pain.  She denies any headache or dizziness or blurred vision.  She admits to bilateral lower extremity edema as well as orthopnea and paroxysmal nocturnal dyspnea with dyspnea on exertion.  She denies any dysuria, oliguria or hematuria or flank pain.  ED Course: When she came to the ER, heart rate was 142 with a respiratory rate of 24 and otherwise normal vital signs.  Labs revealed a creatinine of 1.11 and a calcium of 8.7 with albumin of 3.3 and total protein 6.7.  AST was 52 and ALT 56.  High-sensitivity troponin I was 27 and later 25 and BNP was 1879.4 EKG as reviewed by me : EKG showed right bundle branch block with a rate of 140 with left anterior fascicular block (bifascicular block) however the patient has been in atrial fibrillation on the monitor Imaging: Chest x-ray showed mild bilateral interstitial opacities likely due to pulmonary edema with trace bilateral pleural effusions.  The patient was given 20 mg IV Lasix, 10 mg IV diltiazem and placed on IV diltiazem drip.  She will be admitted to a progressive unit bed for further evaluation and  management. PAST MEDICAL HISTORY:   Past Medical History:  Diagnosis Date  . Atrial fibrillation ([place])   . Benign breast cyst in female, left 10/07/2016  . Colon adenomas   . GERD (gastroesophageal reflux disease)   . Hypertension   . Hypothyroidism   . Macular degeneration   . Mitral regurgitation   . Pulmonary embolism ([place]) 02/2016  . SVT (supraventricular tachycardia) ([place])     PAST SURGICAL HISTORY:   Past Surgical History:  Procedure Laterality Date  . APPENDECTOMY  1960  . BREAST CYST ASPIRATION Left 2017  . CARDIOVERSION N/A 04/05/2016   Procedure: Cardioversion;  Surgeon: [name], MD;  Location: [facility];  Service: Cardiovascular;  Laterality: N/A;  . CHOLECYSTECTOMY  1985  . ESOPHAGOGASTRODUODENOSCOPY (EGD) WITH PROPOFOL N/A 11/27/2019   Procedure: ESOPHAGOGASTRODUODENOSCOPY (EGD) WITH PROPOFOL;  Surgeon: Lesly Rubenstein, MD;  Location: [facility];  Service: Endoscopy;  Laterality: N/A;  . ESOPHAGOGASTRODUODENOSCOPY (EGD) WITH PROPOFOL N/A 12/24/2019   Procedure: ESOPHAGOGASTRODUODENOSCOPY (EGD) WITH PROPOFOL;  Surgeon: Lesly Rubenstein, MD;  Location: [facility];  Service: Endoscopy;  Laterality: N/A;  . IR CT HEAD LTD  11/01/2020  . IR PERCUTANEOUS ART THROMBECTOMY/INFUSION INTRACRANIAL INC DIAG ANGIO  11/01/2020  . IR US GUIDE VASC ACCESS RIGHT  11/02/2020  . OVARIAN CYST REMOVAL    . RADIOLOGY WITH ANESTHESIA N/A 11/01/2020   Procedure: RADIOLOGY WITH ANESTHESIA;  Surgeon: Luanne Bras, MD;  Location: [place];  Service: Radiology;  Laterality: N/A;  .  TEE WITHOUT CARDIOVERSION N/A 02/01/2018   Procedure: TRANSESOPHAGEAL ECHOCARDIOGRAM (TEE);  Surgeon: [name], MD;  Location: [facility];  Service: Cardiovascular;  Laterality: N/A;    SOCIAL HISTORY:   Social History   Tobacco Use  . Smoking status: Never  . Smokeless tobacco: Never  . Tobacco comments:    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 , worked at [employer], [employer] Use  Topics  . Alcohol use: No    FAMILY HISTORY:   Family History  Problem Relation Age of Onset  . Cancer Mother 29       breast cancer, lived to 61,   . Breast cancer Mother 33  . Cancer Son 32       pancreatic cancer  . Heart disease Son        CAD, Tobacco Abuse   . Heart disease Maternal Grandmother 69       died of massive MI  . Stroke Sister     DRUG ALLERGIES:   Allergies  Allergen Reactions  . Statins     Severe myalgias    REVIEW OF SYSTEMS:   ROS As per history of present illness. All pertinent systems were reviewed above. Constitutional, HEENT, cardiovascular, respiratory, GI, GU, musculoskeletal, neuro, psychiatric, endocrine, integumentary and hematologic systems were reviewed and are otherwise negative/unremarkable except for positive findings mentioned above in the HPI.   MEDICATIONS AT HOME:   Prior to Admission medications   Medication Sig Start Date End Date Taking? Authorizing Provider  ezetimibe (ZETIA) 10 MG tablet Take 1 tablet by mouth daily. 12/23/20  Yes [provider]  furosemide (LASIX) 20 MG tablet Take 1 tablet by mouth daily. 12/31/20 12/31/21 Yes [provider]  metoprolol succinate (TOPROL-XL) 25 MG 24 hr tablet Take 12.5 mg by mouth daily. 12/11/20  Yes [provider]  acetaminophen (TYLENOL) 500 MG tablet Take 500 mg by mouth every 6 (six) hours as needed.    [provider]  ALPRAZolam Duanne Moron) 0.25 MG tablet Take 1 tablet (0.25 mg total) by mouth at bedtime as needed for anxiety. 12/07/20   [name], MD  amiodarone (PACERONE) 200 MG tablet Take 1/2 tablet (100 mg total) by mouth daily. 11/13/20   Love, Ivan Anchors, PA-C  calcium carbonate (TUMS - DOSED IN MG ELEMENTAL CALCIUM) 500 MG chewable tablet Chew 1 tablet (200 mg of elemental calcium total) by mouth 2 (two) times daily with a meal. 11/13/20   Love, Ivan Anchors, PA-C  famotidine (PEPCID) 20 MG tablet Take 1 tablet (20 mg total) by mouth daily.  11/13/20   Love, Ivan Anchors, PA-C  ipratropium (ATROVENT) 0.02 % nebulizer solution Take 2.5 mLs (0.5 mg total) by nebulization every 6 (six) hours as needed for wheezing or shortness of breath (cough). 11/04/20   Vonzella Nipple, NP  levothyroxine (EUTHYROX) 50 MCG tablet Take 1 tablet (50 mcg total) by mouth daily at 6 (six) AM. 11/13/20   Love, Ivan Anchors, PA-C  megestrol (MEGACE) 400 MG/10ML suspension Take 10 mLs (400 mg total) by mouth 2 (two) times daily. 12/07/20   [name], MD  melatonin 3 MG TABS tablet Take 1 tablet (3 mg total) by mouth at bedtime as needed. 11/13/20   Love, Ivan Anchors, PA-C  metoprolol succinate (TOPROL-XL) 25 MG 24 hr tablet Take 1/2 tablet (12.5 mg total) by mouth every morning. 11/14/20   Love, Ivan Anchors, PA-C  Multiple Vitamin (MULTIVITAMIN WITH MINERALS) TABS tablet Take 1 tablet by mouth daily. 11/13/20  Love, Pamela S, PA-C  polyethylene glycol (MIRALAX / GLYCOLAX) 17 g packet Take 17 g by mouth daily as needed for moderate constipation. 11/04/20   Vonzella Nipple, NP  Rivaroxaban (XARELTO) 15 MG TABS tablet Take 1 tablet (15 mg total) by mouth daily with supper. 11/13/20   Love, Ivan Anchors, PA-C      VITAL SIGNS:  Blood pressure (!) 153/103, pulse (!) 124, temperature 97.9 F (36.6 C), temperature source Oral, resp. rate (!) 21, height 5\' 3"  (1.6 m), weight 53.5 kg, SpO2 100 %.  PHYSICAL EXAMINATION:  Physical Exam  GENERAL:  87 y.o.-year-old Caucasian female patient lying in the bed with no acute distress.  EYES: Pupils equal, round, reactive to light and accommodation. No scleral icterus. Extraocular muscles intact.  HEENT: Head atraumatic, normocephalic. Oropharynx and nasopharynx clear.  NECK:  Supple, no jugular venous distention. No thyroid enlargement, no tenderness.  LUNGS: Diminished bibasilar breath sounds with bibasilar rales.   CARDIOVASCULAR: Regular rate and rhythm, S1, S2 normal. No murmurs, rubs, or gallops.  ABDOMEN: Soft, nondistended,  nontender. Bowel sounds present. No organomegaly or mass.  EXTREMITIES: 1-2+ bilateral lower extremity pitting edema with no cyanosis, or clubbing.  NEUROLOGIC: Cranial nerves II through XII are intact. Muscle strength 5/5 in all extremities. Sensation intact. Gait not checked.  PSYCHIATRIC: The patient is alert and oriented x 3.  Normal affect and good eye contact. SKIN: No obvious rash, lesion, or ulcer.   LABORATORY PANEL:   CBC Recent Labs  Lab 01/12/21 0955  WBC 7.1  HGB 7.7*  HCT 25.9*  PLT 509*   ------------------------------------------------------------------------------------------------------------------  Chemistries  Recent Labs  Lab 01/12/21 0955 01/12/21 1343  NA 137  --   K 4.5  --   CL 111  --   CO2 20*  --   GLUCOSE 98  --   BUN 18  --   CREATININE 1.11*  --   CALCIUM 8.7*  --   AST  --  52*  ALT  --  56*  ALKPHOS  --  96  BILITOT  --  1.0   ------------------------------------------------------------------------------------------------------------------  Cardiac Enzymes No results for input(s): TROPONINI in the last 168 hours. ------------------------------------------------------------------------------------------------------------------  RADIOLOGY:  DG Chest 2 View  Result Date: 01/12/2021 CLINICAL DATA:  Chest pain EXAM: CHEST - 2 VIEW COMPARISON:  Chest x-ray dated November 07, 2020 FINDINGS: Unchanged cardiac and mediastinal contours. Mild bilateral interstitial opacities. Trace bilateral pleural effusions. No evidence of pneumothorax. IMPRESSION: 1. Mild bilateral interstitial opacities, likely due to pulmonary edema. 2. Trace bilateral pleural effusions. Electronically Signed   By: Yetta Glassman M.D.   On: 01/12/2021 10:34      IMPRESSION AND PLAN:  Active Problems:   Atrial fibrillation with RVR (HCC)  1.  Paroxysmal atrial fibrillation with RVR with associated SVT. - The patient will be admitted to a cardiac telemetry bed. - We  will continue her on IV Cardizem drip. - We will follow serial troponins. - We will continue her amiodarone. - Cardiology consult will be obtained. - I notified Dr. Corky Sox about the patient.  2.  Acute on chronic likely stage CHF. - The patient will be diuresed with IV Lasix. - Last 2D echo on 918/2022 revealed EF of 40 to 45% with left and right atrial moderate dilatation, severe mitral regurgitation and moderate tricuspid regurgitation and trivial aortic regurgitation with indeterminate diastolic parameters.. - We will follow serial troponins. - Cardiology consult will be obtained as mentioned above.  3.  Hypertensive  urgency, likely contributing to #2 - We will continue Toprol-XL and she will be on IV Cardizem drip. - She will also be placed on as needed IV labetalol.  4.  Hypothyroidism. - We will continue her Synthroid.  5.  Dyslipidemia. - She is not taking her Zetia anymore.  This is apparently diet managed.  6.  Acute on chronic anemia. - Repeat H&H are currently better. - Anemia work-up was ordered.  DVT prophylaxis: Xarelto. Code Status: I discussed the CODE STATUS with her and she desires to be full code.   Family Communication:  The plan of care was discussed in details with the patient (and family). I answered all questions. The patient agreed to proceed with the above mentioned plan. Further management will depend upon hospital course. Disposition Plan: Back to previous home environment Consults called: Cardiology. All the records are reviewed and case discussed with ED provider.  Status is: Inpatient   Remains inpatient appropriate because:Ongoing diagnostic testing needed not appropriate for outpatient work up, Unsafe d/c plan, IV treatments appropriate due to intensity of illness or inability to take PO, and Inpatient level of care appropriate due to severity of illness   Dispo: The patient is from: Home              Anticipated d/c is to: Home               Patient currently is not medically stable to d/c.              Difficult to place patient: No  TOTAL TIME TAKING CARE OF THIS PATIENT: 55 minutes.     Christel Mormon M.D on 01/12/2021 at 4:16 PM  [facility]   From 7 PM-7 AM, contact night-coverage [email or web address]  CC: Primary care physician; [name], MD

## 2021-01-12 NOTE — ED Provider Notes (Signed)
[facility] Emergency Department Provider Note  ____________________________________________   Event Date/Time   First MD Initiated Contact with Patient 01/12/21 1233     (approximate)  I have reviewed the triage vital signs and the nursing notes.   HISTORY  Chief Complaint Shortness of Breath    HPI Frances Kent is a 86 y.o. female here with shortness of breath, weakness.  The patient states that over the last week or 2, she has had progressive worsening generalized weakness.  She has noticed increasing dyspnea with exertion and has had difficulty getting around for the last day or so.  She states that she has noticed that her heart rate has been high.  Symptoms seem to worsen over the last 24 hours so she presents for further evaluation.  She had some mild chest pressure, particular with exertion.  No chest pain at rest.  No fevers or chills.  No recent medication changes.  She believes she has gained some weight recently.  She is had some mild increase in lower extremity swelling.    Past Medical History:  Diagnosis Date   Atrial fibrillation ([place])    Benign breast cyst in female, left 10/07/2016   Colon adenomas    GERD (gastroesophageal reflux disease)    Hypertension    Hypothyroidism    Macular degeneration    Mitral regurgitation    Pulmonary embolism ([place]) 02/2016   SVT (supraventricular tachycardia) (HCC)     Patient Active Problem List   Diagnosis Date Noted   History of embolic stroke 87/56/4332   Inflamed external hemorrhoid 12/08/2020   Insomnia due to anxiety and fear 12/08/2020   Long term (current) use of anticoagulants    Chronic HFrEF (heart failure with reduced ejection fraction) ([place])    Left basal ganglia embolic stroke ([place]) 95/18/8416   Malnutrition of moderate degree ([place]) 11/03/2020   Prolonged QT interval    Acute on chronic systolic (congestive) heart failure (HCC)    Atrial fibrillation with RVR ([place]) 10/30/2020    Antibiotic-associated diarrhea 10/15/2020   ILD (interstitial lung disease) ([place]) 09/22/2020   Tachyarrhythmia 09/08/2020   Sick sinus syndrome ([place]) 09/08/2020   Elevated serum free T4 level 09/08/2020   Purpura senilis ([place]) 08/28/2020   Hiatal hernia 08/09/2020   Atrial fibrillation with rapid ventricular response ([place]) 08/06/2020   Thrombophilia ([place]) 08/06/2020   History of COVID-19 03/03/2020   Schatzki's ring of distal esophagus 12/11/2019   Erosive gastritis 11/30/2019   Dysphagia 11/27/2019   Painful swallowing 11/27/2019   Gastroesophageal reflux disease 11/27/2019   Stage 3a chronic kidney disease ([place]) 04/11/2019   Coagulopathy ([place]) 09/12/2018   Essential hypertension 09/12/2018   CHF (congestive heart failure) ([place]) 08/15/2018   Hypothyroidism due to acquired atrophy of thyroid 05/08/2018   Unintentional weight loss 05/08/2018   Myalgia due to statin 05/08/2018   Abdominal aortic atherosclerosis ([place]) 05/07/2018   Bradycardia 04/28/2018   Pleural effusion on left 02/27/2018   Moderate mitral stenosis 01/10/2018   Lumbar radiculitis 01/07/2018   B12 deficiency 07/26/2017   Atrial fibrillation status post cardioversion [facility]) 04/06/2016   Hospital discharge follow-up 03/10/2016   Vitamin D deficiency 04/08/2015   Cervical spine degeneration 12/13/2014   History of pulmonary embolism 12/02/2014   Insomnia 10/04/2014   Moderate tricuspid insufficiency 09/17/2014   Generalized anxiety disorder 03/28/2013   Mitral valve prolapse 03/28/2013   Routine adult health maintenance 03/23/2012   Fatigue 03/23/2012   Cough 04/17/2011   Screening for breast  cancer 11/29/2010   Macular degeneration, left eye 11/29/2010   Screening for colon cancer 11/29/2010   Hyperlipidemia LDL goal <70 11/29/2010   GERD (gastroesophageal reflux disease)     Past Surgical History:  Procedure Laterality Date   APPENDECTOMY  1960   BREAST CYST ASPIRATION Left 2017   CARDIOVERSION N/A  04/05/2016   Procedure: Cardioversion;  Surgeon: [name], MD;  Location: [facility] ORS;  Service: Cardiovascular;  Laterality: N/A;   CHOLECYSTECTOMY  1985   ESOPHAGOGASTRODUODENOSCOPY (EGD) WITH PROPOFOL N/A 11/27/2019   Procedure: ESOPHAGOGASTRODUODENOSCOPY (EGD) WITH PROPOFOL;  Surgeon: Lesly Rubenstein, MD;  Location: [facility] ENDOSCOPY;  Service: Endoscopy;  Laterality: N/A;   ESOPHAGOGASTRODUODENOSCOPY (EGD) WITH PROPOFOL N/A 12/24/2019   Procedure: ESOPHAGOGASTRODUODENOSCOPY (EGD) WITH PROPOFOL;  Surgeon: Lesly Rubenstein, MD;  Location: [facility] ENDOSCOPY;  Service: Endoscopy;  Laterality: N/A;   IR CT HEAD LTD  11/01/2020   IR PERCUTANEOUS ART THROMBECTOMY/INFUSION INTRACRANIAL INC DIAG ANGIO  11/01/2020   IR US GUIDE VASC ACCESS RIGHT  11/02/2020   OVARIAN CYST REMOVAL     RADIOLOGY WITH ANESTHESIA N/A 11/01/2020   Procedure: RADIOLOGY WITH ANESTHESIA;  Surgeon: Luanne Bras, MD;  Location: [place];  Service: Radiology;  Laterality: N/A;   TEE WITHOUT CARDIOVERSION N/A 02/01/2018   Procedure: TRANSESOPHAGEAL ECHOCARDIOGRAM (TEE);  Surgeon: [name], MD;  Location: [facility] ORS;  Service: Cardiovascular;  Laterality: N/A;    Prior to Admission medications   Medication Sig Start Date End Date Taking? Authorizing Provider  acetaminophen (TYLENOL) 500 MG tablet Take 500 mg by mouth every 6 (six) hours as needed.    [provider]  ALPRAZolam Duanne Moron) 0.25 MG tablet Take 1 tablet (0.25 mg total) by mouth at bedtime as needed for anxiety. 12/07/20   [name], MD  amiodarone (PACERONE) 200 MG tablet Take 1/2 tablet (100 mg total) by mouth daily. 11/13/20   Love, Ivan Anchors, PA-C  calcium carbonate (TUMS - DOSED IN MG ELEMENTAL CALCIUM) 500 MG chewable tablet Chew 1 tablet (200 mg of elemental calcium total) by mouth 2 (two) times daily with a meal. 11/13/20   Love, Ivan Anchors, PA-C  ezetimibe (ZETIA) 10 MG tablet Take 1 tablet (10 mg total) by mouth daily. 11/13/20   Love,  Ivan Anchors, PA-C  famotidine (PEPCID) 20 MG tablet Take 1 tablet (20 mg total) by mouth daily. 11/13/20   Love, Ivan Anchors, PA-C  furosemide (LASIX) 20 MG tablet Take 20 mg by mouth daily as needed. 10/26/20   [provider]  ipratropium (ATROVENT) 0.02 % nebulizer solution Take 2.5 mLs (0.5 mg total) by nebulization every 6 (six) hours as needed for wheezing or shortness of breath (cough). 11/04/20   Vonzella Nipple, NP  levothyroxine (EUTHYROX) 50 MCG tablet Take 1 tablet (50 mcg total) by mouth daily at 6 (six) AM. 11/13/20   Love, Ivan Anchors, PA-C  megestrol (MEGACE) 400 MG/10ML suspension Take 10 mLs (400 mg total) by mouth 2 (two) times daily. 12/07/20   [name], MD  melatonin 3 MG TABS tablet Take 1 tablet (3 mg total) by mouth at bedtime as needed. 11/13/20   Love, Ivan Anchors, PA-C  metoprolol succinate (TOPROL-XL) 25 MG 24 hr tablet Take 1/2 tablet (12.5 mg total) by mouth every morning. 11/14/20   Love, Ivan Anchors, PA-C  Multiple Vitamin (MULTIVITAMIN WITH MINERALS) TABS tablet Take 1 tablet by mouth daily. 11/13/20   Love, Ivan Anchors, PA-C  polyethylene glycol (MIRALAX / GLYCOLAX) 17 g packet  Take 17 g by mouth daily as needed for moderate constipation. 11/04/20   Vonzella Nipple, NP  Rivaroxaban (XARELTO) 15 MG TABS tablet Take 1 tablet (15 mg total) by mouth daily with supper. 11/13/20   Bary Leriche, PA-C    Allergies Statins  Family History  Problem Relation Age of Onset   Cancer Mother 68       breast cancer, lived to 81,    Breast cancer Mother 45   Cancer Son 71       pancreatic cancer   Heart disease Son        CAD, Tobacco Abuse    Heart disease Maternal Grandmother 80       died of massive MI   Stroke Sister     Social History Social History   Tobacco Use   Smoking status: Never   Smokeless tobacco: Never   Tobacco comments:    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 , worked at [employer], Development worker, 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 used  Substance Use Topics   Alcohol  use: No   Drug use: No    Review of Systems  Review of Systems  Constitutional:  Positive for fatigue. Negative for fever.  HENT:  Negative for congestion and sore throat.   Eyes:  Negative for visual disturbance.  Respiratory:  Positive for shortness of breath. Negative for cough.   Cardiovascular:  Positive for palpitations and leg swelling. Negative for chest pain.  Gastrointestinal:  Negative for abdominal pain, diarrhea, nausea and vomiting.  Genitourinary:  Negative for flank pain.  Musculoskeletal:  Negative for back pain and neck pain.  Skin:  Negative for rash and wound.  Neurological:  Positive for weakness.  All other systems reviewed and are negative.   ____________________________________________  PHYSICAL EXAM:      VITAL SIGNS: ED Triage Vitals  Enc Vitals Group     BP 01/12/21 0954 118/78     Pulse Rate 01/12/21 0954 (!) 142     Resp 01/12/21 0954 (!) 24     Temp 01/12/21 0954 98.1 F (36.7 C)     Temp Source 01/12/21 0954 Oral     SpO2 01/12/21 0954 94 %     Weight 01/12/21 0955 118 lb (53.5 kg)     Height 01/12/21 0955 5\' 3"  (1.6 m)     Head Circumference --      Peak Flow --      Pain Score 01/12/21 0954 4     Pain Loc --      Pain Edu? --      Excl. in [place]? --      Physical Exam Vitals and nursing note reviewed.  Constitutional:      General: She is not in acute distress.    Appearance: She is well-developed.  HENT:     Head: Normocephalic and atraumatic.  Eyes:     Conjunctiva/sclera: Conjunctivae normal.  Cardiovascular:     Rate and Rhythm: Regular rhythm. Tachycardia present.     Heart sounds: Normal heart sounds. No murmur heard.   No friction rub.  Pulmonary:     Effort: Pulmonary effort is normal. No tachypnea or respiratory distress.     Breath sounds: Rales present. No wheezing.  Abdominal:     General: There is no distension.     Palpations: Abdomen is soft.     Tenderness: There is no abdominal tenderness.  Musculoskeletal:      Cervical back: Neck supple.  Right lower leg: Edema present.     Left lower leg: Edema present.  Skin:    General: Skin is warm.     Capillary Refill: Capillary refill takes less than 2 seconds.  Neurological:     Mental Status: She is alert and oriented to person, place, and time.     Motor: No abnormal muscle tone.      ____________________________________________   LABS (all labs ordered are listed, but only abnormal results are displayed)  Labs Reviewed  BASIC METABOLIC PANEL - Abnormal; Notable for the following components:      Result Value   CO2 20 (*)    Creatinine, Ser 1.11 (*)    Calcium 8.7 (*)    GFR, Estimated 48 (*)    All other components within normal limits  CBC - Abnormal; Notable for the following components:   RBC 3.13 (*)    Hemoglobin 7.7 (*)    HCT 25.9 (*)    MCH 24.6 (*)    MCHC 29.7 (*)    RDW 17.9 (*)    Platelets 509 (*)    All other components within normal limits  BRAIN NATRIURETIC PEPTIDE - Abnormal; Notable for the following components:   B Natriuretic Peptide 1,879.4 (*)    All other components within normal limits  HEPATIC FUNCTION PANEL - Abnormal; Notable for the following components:   Albumin 3.3 (*)    AST 52 (*)    ALT 56 (*)    All other components within normal limits  TROPONIN I (HIGH SENSITIVITY) - Abnormal; Notable for the following components:   Troponin I (High Sensitivity) 27 (*)    All other components within normal limits  TROPONIN I (HIGH SENSITIVITY) - Abnormal; Notable for the following components:   Troponin I (High Sensitivity) 25 (*)    All other components within normal limits  VITAMIN B12  FOLATE  IRON AND TIBC  FERRITIN  RETICULOCYTES  TYPE AND SCREEN    ____________________________________________  EKG: Atrial fibrillation with rapid ventricular response, ventricular rate 140.  QRS 124, QTc 564.  Right bundle branch and left anterior fascicular block.  No ST  elevations. ________________________________________  RADIOLOGY All imaging, including plain films, CT scans, and ultrasounds, independently reviewed by me, and interpretations confirmed via formal radiology reads.  ED MD interpretation:   Chest x-ray: Bilateral pulmonary edema, trace bilateral effusions  Official radiology report(s): DG Chest 2 View  Result Date: 01/12/2021 CLINICAL DATA:  Chest pain EXAM: CHEST - 2 VIEW COMPARISON:  Chest x-ray dated November 07, 2020 FINDINGS: Unchanged cardiac and mediastinal contours. Mild bilateral interstitial opacities. Trace bilateral pleural effusions. No evidence of pneumothorax. IMPRESSION: 1. Mild bilateral interstitial opacities, likely due to pulmonary edema. 2. Trace bilateral pleural effusions. Electronically Signed   By: Yetta Glassman M.D.   On: 01/12/2021 10:34    ____________________________________________  PROCEDURES   Procedure(s) performed (including Critical Care):  .Critical Care Performed by: [name], MD Authorized by: [name], MD   Critical care provider statement:    Critical care time (minutes):  30   Critical care time was exclusive of:  Separately billable procedures and treating other patients   Critical care was necessary to treat or prevent imminent or life-threatening deterioration of the following conditions:  Cardiac failure, circulatory failure and respiratory failure   Critical care was time spent personally by me on the following activities:  Development of treatment plan with patient or surrogate, discussions with consultants, evaluation of patient's response to treatment,  examination of patient, ordering and review of laboratory studies, ordering and review of radiographic studies, ordering and performing treatments and interventions, pulse oximetry, re-evaluation of patient's condition and review of old charts   I assumed direction of critical care for this patient from another provider in  my specialty: no     Care discussed with: admitting provider    ____________________________________________  INITIAL IMPRESSION / MDM / [place] / ED COURSE  As part of my medical decision making, I reviewed the following data within the [facility] notes reviewed and incorporated, Old chart reviewed, Notes from prior ED visits, and [place] Controlled Substance Database       *YAMILEX BORGWARDT was evaluated in Emergency Department on 01/12/2021 for the symptoms described in the history of present illness. She was evaluated in the context of the global COVID-19 pandemic, which necessitated consideration that the patient might be at risk for infection with the SARS-CoV-2 virus that causes COVID-19. Institutional protocols and algorithms that pertain to the evaluation of patients at risk for COVID-19 are in a state of rapid change based on information released by regulatory bodies including the CDC and federal and state organizations. These policies and algorithms were followed during the patient's care in the ED.  Some ED evaluations and interventions may be delayed as a result of limited staffing during the pandemic.*     Medical Decision Making: 87 year old female here with shortness of breath and orthopnea.  Patient is hypervolemic on exam with A. fib RVR.  Chest x-ray shows pulmonary edema.  EKG shows A. fib with nonspecific T wave changes, likely rate related.  Troponin 27, do not suspect acute ischemia.  BNP markedly elevated.  Renal function is at baseline.  Patient also has mild acute on chronic anemia for which anemia panel has been sent.  Will plan to diurese, started on diltiazem bolus and drip, and admit to medicine.  Patient updated and is in agreement with this plan.  No recent fevers, chills, or infectious symptoms.  ____________________________________________  FINAL CLINICAL IMPRESSION(S) / ED DIAGNOSES  Final diagnoses:  Atrial fibrillation with  rapid ventricular response (HCC)  Acute on chronic congestive heart failure, unspecified heart failure type ([place])  Acute on chronic anemia     MEDICATIONS GIVEN DURING THIS VISIT:  Medications  diltiazem (CARDIZEM) 1 mg/mL load via infusion 10 mg (10 mg Intravenous Bolus from Bag 01/12/21 1432)    And  diltiazem (CARDIZEM) 125 mg in dextrose 5% 125 mL (1 mg/mL) infusion (5 mg/hr Intravenous New Bag/Given 01/12/21 1433)  furosemide (LASIX) injection 20 mg (20 mg Intravenous Given 01/12/21 1431)     ED Discharge Orders     None        Note:  This document was prepared using Dragon voice recognition software and may include unintentional dictation errors.   [name], MD 01/12/21 1531

## 2021-01-12 NOTE — Consult Note (Signed)
[facility] Cardiology  CARDIOLOGY CONSULT NOTE  Patient ID: Frances Kent MRN: [id number] DOB/AGE: May 23, 1930 87 y.o.  Admit date: 01/12/2021 Referring Physician Eugenie Norrie Primary Physician [name], MD Primary Cardiologist [name] Reason for Consultation AF with RVR  HPI:  Frances Kent is an 87 year old female with a history of paroxysmal atrial fibrillation, HFrEF (EF 40 to 45%), severe MR, hypertension, PE who was admitted with worsening shortness of breath since last night.  Cardiology is consulted for assistance in management of atrial fibrillation with RVR.  The patient states that she has had some worsening shortness of breath over the last few days that acutely worsened 11/29.  She also had some fatigue developed as well, and some lower extremity edema.  This prompted her presentation to the emergency department.  On arrival her heart rate was elevated to approximately 140 bpm blood pressure 105/79.  Her labs were notable for a troponin of 27 which was flat on repeat, BNP 1879 (elevated compared to baseline), hemoglobin 7.7, baseline approximately 9), creatinine 1.1.  She was given Lasix 20 mg IV, diltiazem IV 10 mg, and started on a diltiazem infusion. She converted back to NSR around 9 pm last night. This morning she is feeling back to normal.  Her major complaint this morning is dysphagia which is ongoing.  This started yesterday and has been quite bothersome to her.  Review of systems complete and found to be negative unless listed above     Past Medical History:  Diagnosis Date   Atrial fibrillation ([place])    Benign breast cyst in female, left 10/07/2016   Colon adenomas    GERD (gastroesophageal reflux disease)    Hypertension    Hypothyroidism    Macular degeneration    Mitral regurgitation    Pulmonary embolism ([place]) 02/2016   SVT (supraventricular tachycardia) ([place])     Past Surgical History:  Procedure Laterality Date   APPENDECTOMY  1960   BREAST CYST  ASPIRATION Left 2017   CARDIOVERSION N/A 04/05/2016   Procedure: Cardioversion;  Surgeon: [name], MD;  Location: [facility] ORS;  Service: Cardiovascular;  Laterality: N/A;   CHOLECYSTECTOMY  1985   ESOPHAGOGASTRODUODENOSCOPY (EGD) WITH PROPOFOL N/A 11/27/2019   Procedure: ESOPHAGOGASTRODUODENOSCOPY (EGD) WITH PROPOFOL;  Surgeon: Lesly Rubenstein, MD;  Location: [facility] ENDOSCOPY;  Service: Endoscopy;  Laterality: N/A;   ESOPHAGOGASTRODUODENOSCOPY (EGD) WITH PROPOFOL N/A 12/24/2019   Procedure: ESOPHAGOGASTRODUODENOSCOPY (EGD) WITH PROPOFOL;  Surgeon: Lesly Rubenstein, MD;  Location: [facility] ENDOSCOPY;  Service: Endoscopy;  Laterality: N/A;   IR CT HEAD LTD  11/01/2020   IR PERCUTANEOUS ART THROMBECTOMY/INFUSION INTRACRANIAL INC DIAG ANGIO  11/01/2020   IR US GUIDE VASC ACCESS RIGHT  11/02/2020   OVARIAN CYST REMOVAL     RADIOLOGY WITH ANESTHESIA N/A 11/01/2020   Procedure: RADIOLOGY WITH ANESTHESIA;  Surgeon: Luanne Bras, MD;  Location: [place];  Service: Radiology;  Laterality: N/A;   TEE WITHOUT CARDIOVERSION N/A 02/01/2018   Procedure: TRANSESOPHAGEAL ECHOCARDIOGRAM (TEE);  Surgeon: [name], MD;  Location: [facility] ORS;  Service: Cardiovascular;  Laterality: N/A;    Medications Prior to Admission  Medication Sig Dispense Refill Last Dose   acetaminophen (TYLENOL) 500 MG tablet Take 500 mg by mouth every 6 (six) hours as needed.   Past Month   ALPRAZolam (XANAX) 0.25 MG tablet Take 1 tablet (0.25 mg total) by mouth at bedtime as needed for anxiety. 20 tablet 0 01/11/2021   amiodarone (PACERONE) 200 MG tablet Take 1/2 tablet (100 mg total)  by mouth daily. 15 tablet 0 01/11/2021   calcium carbonate (TUMS - DOSED IN MG ELEMENTAL CALCIUM) 500 MG chewable tablet Chew 1 tablet (200 mg of elemental calcium total) by mouth 2 (two) times daily with a meal.   Past Week   furosemide (LASIX) 20 MG tablet Take 1 tablet by mouth daily.   Past Week   ipratropium (ATROVENT) 0.02 % nebulizer  solution Take 2.5 mLs (0.5 mg total) by nebulization every 6 (six) hours as needed for wheezing or shortness of breath (cough). 75 mL 12 Past Week   levothyroxine (EUTHYROX) 50 MCG tablet Take 1 tablet (50 mcg total) by mouth daily at 6 (six) AM. 30 tablet 0 01/11/2021   metoprolol succinate (TOPROL-XL) 25 MG 24 hr tablet Take 1/2 tablet (12.5 mg total) by mouth every morning. 15 tablet 0 01/11/2021   Multiple Vitamin (MULTIVITAMIN WITH MINERALS) TABS tablet Take 1 tablet by mouth daily.   01/11/2021   polyethylene glycol (MIRALAX / GLYCOLAX) 17 g packet Take 17 g by mouth daily as needed for moderate constipation. 14 each 0 Past Month   Rivaroxaban (XARELTO) 15 MG TABS tablet Take 1 tablet (15 mg total) by mouth daily with supper. 30 tablet 0 01/11/2021   ezetimibe (ZETIA) 10 MG tablet Take 1 tablet by mouth daily. (Patient not taking: Reported on 01/12/2021)   Not Taking   famotidine (PEPCID) 20 MG tablet Take 1 tablet (20 mg total) by mouth daily. (Patient not taking: Reported on 01/12/2021) 30 tablet 0 Not Taking   megestrol (MEGACE) 400 MG/10ML suspension Take 10 mLs (400 mg total) by mouth 2 (two) times daily. (Patient not taking: Reported on 01/12/2021) 600 mL 1 Not Taking   melatonin 3 MG TABS tablet Take 1 tablet (3 mg total) by mouth at bedtime as needed. 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  Substance and Sexual Activity   Alcohol use: No   Drug use: No   Sexual activity: Not Currently  Other Topics Concern   Not on file  Social  History Narrative   Has caretaking responsibility for 3 young grandchildren who live with her   Social Determinants of Health   Financial Resource Strain: Not on file  Food Insecurity: Not on file  Transportation Needs: Not on file  Physical Activity: Not on file  Stress: Not on file  Social Connections: Not on file  Intimate Partner Violence: Not on file    Family History  Problem Relation Age of Onset   Cancer Mother 54       breast cancer, lived to 77,    Breast cancer Mother 25   Cancer Son 65       pancreatic cancer   Heart disease Son        CAD, Tobacco Abuse    Heart disease Maternal Grandmother 80       died of massive MI   Stroke Sister       Review  of systems complete and found to be negative unless listed above      PHYSICAL EXAM  General: Well developed, well nourished, in no acute distress HEENT:  Normocephalic and atramatic Neck:  No JVD.  Lungs: Clear bilaterally to auscultation and percussion. Heart: HRRR . Normal S1 and S2 without gallops or murmurs.  Abdomen: Bowel sounds are positive, abdomen soft and non-tender  Msk:  Back normal, normal gait. Normal strength and tone for age. Extremities: No clubbing, cyanosis. Trace to 1+ edema to lower shin.  Neuro: Alert and oriented X 3. Psych:  Good affect, responds appropriately  Labs:   Lab Results  Component Value Date   WBC 6.1 01/12/2021   HGB 8.2 (L) 01/12/2021   HCT 27.4 (L) 01/12/2021   MCV 82.5 01/12/2021   PLT 457 (H) 01/12/2021    Recent Labs  Lab 01/12/21 0955 01/12/21 1343 01/12/21 1559  NA 137  --   --   K 4.5  --   --   CL 111  --   --   CO2 20*  --   --   BUN 18  --   --   CREATININE 1.11*  --  1.18*  CALCIUM 8.7*  --   --   PROT  --  6.7  --   BILITOT  --  1.0  --   ALKPHOS  --  96  --   ALT  --  56*  --   AST  --  52*  --   GLUCOSE 98  --   --    Lab Results  Component Value Date   CKTOTAL 83 05/09/2018   TROPONINI <0.03 02/25/2018    Lab Results  Component  Value Date   CHOL 144 11/02/2020   CHOL 284 (H) 12/11/2019   CHOL 208 (H) 08/31/2018   Lab Results  Component Value Date   HDL 48 11/02/2020   HDL 100.10 12/11/2019   HDL 72.40 08/31/2018   Lab Results  Component Value Date   LDLCALC 81 11/02/2020   LDLCALC 166 (H) 12/11/2019   LDLCALC 116 (H) 08/31/2018   Lab Results  Component Value Date   TRIG 76 11/02/2020   TRIG 72 11/02/2020   TRIG 91.0 12/11/2019   Lab Results  Component Value Date   CHOLHDL 3.0 11/02/2020   CHOLHDL 3 12/11/2019   CHOLHDL 3 08/31/2018   Lab Results  Component Value Date   LDLDIRECT 185.3 03/27/2013   LDLDIRECT 156.1 03/23/2012   LDLDIRECT 181.4 01/17/2011      Radiology: DG Chest 2 View  Result Date: 01/12/2021 CLINICAL DATA:  Chest pain EXAM: CHEST - 2 VIEW COMPARISON:  Chest x-ray dated November 07, 2020 FINDINGS: Unchanged cardiac and mediastinal contours. Mild bilateral interstitial opacities. Trace bilateral pleural effusions. No evidence of pneumothorax. IMPRESSION: 1. Mild bilateral interstitial opacities, likely due to pulmonary edema. 2. Trace bilateral pleural effusions. Electronically Signed   By: Yetta Glassman M.D.   On: 01/12/2021 10:34    EKG: AF vs flutter, RBBB, LAFB (bifascicular block) at rate of 140 bpm  Echo 10/2020-  1. Left ventricular ejection fraction, by estimation, is 40 to 45%. The  left ventricle has mildly decreased function. The left ventricle has no  regional wall motion abnormalities. Left ventricular diastolic parameters  are indeterminate.   2. Right ventricular systolic function is normal. The right ventricular  size is normal.   3. Left atrial size was moderately dilated.   4. Right atrial size was moderately  dilated.   5. The mitral valve is normal in structure. Moderate to severe mitral  valve regurgitation. No evidence of mitral stenosis.   6. Tricuspid valve regurgitation is moderate.   7. The aortic valve is normal in structure. Aortic valve  regurgitation is  trivial. No aortic stenosis is present.   8. The inferior vena cava is normal in size with greater than 50%  respiratory variability, suggesting right atrial pressure of 3 mmHg.   ASSESSMENT AND PLAN:  Hadlea Furuya is an 87 year old female with a history of paroxysmal atrial fibrillation, HFrEF (EF 40 to 45%), severe MR, hypertension, PE who was admitted with worsening shortness of breath since last night.  Cardiology is consulted for assistance in management of atrial fibrillation with RVR.  # AF with RVR #Paroxysmal atrial fibrillation #Heart failure with reduced ejection fraction #Severe MR Patient has multiple presentations with atrial fibrillation with RVR, most recently in September 2022 at which time she required electrical cardioversion back to normal sinus rhythm.  She now presents with 1 to 2 days of worsening shortness of breath and lower extremity edema and was discovered to be back in atrial fibrillation with RVR. She is back in NSR this morning. -Continue anticoagulation with Xarelto--> If acute blood loss anemia is suspected this can be held as needed.  -Continue amiodarone 200 mg daily -Diltiazem- stop infusion - Resume home metoprolol XL - Lasix 40 mg IV given this morning, after this transition to PO lasix 20 mg daily.   # Acute on chronic anemia Recent baseline hbg 9-10, decreased to 7.3 this morning.  Iron studies appear to be consistent with iron deficiency anemia.  She denies any obvious signs or symptoms of ongoing blood loss. -Xarelto for anticoagulation for A. fib as above.  If needed this can be held.  Signed: Andrez Grime MD 01/12/2021, 5:39 PM

## 2021-01-12 NOTE — ED Notes (Signed)
Tiffany RN aware of assigned bed 

## 2021-01-13 DIAGNOSIS — R5381 Other malaise: Secondary | ICD-10-CM

## 2021-01-13 DIAGNOSIS — D649 Anemia, unspecified: Secondary | ICD-10-CM | POA: Diagnosis not present

## 2021-01-13 DIAGNOSIS — E039 Hypothyroidism, unspecified: Secondary | ICD-10-CM | POA: Diagnosis not present

## 2021-01-13 DIAGNOSIS — N1831 Chronic kidney disease, stage 3a: Secondary | ICD-10-CM | POA: Diagnosis not present

## 2021-01-13 DIAGNOSIS — D509 Iron deficiency anemia, unspecified: Secondary | ICD-10-CM | POA: Diagnosis not present

## 2021-01-13 DIAGNOSIS — I4819 Other persistent atrial fibrillation: Secondary | ICD-10-CM | POA: Diagnosis not present

## 2021-01-13 DIAGNOSIS — I5023 Acute on chronic systolic (congestive) heart failure: Secondary | ICD-10-CM | POA: Diagnosis not present

## 2021-01-13 DIAGNOSIS — I4891 Unspecified atrial fibrillation: Secondary | ICD-10-CM | POA: Diagnosis not present

## 2021-01-13 DIAGNOSIS — I5022 Chronic systolic (congestive) heart failure: Secondary | ICD-10-CM

## 2021-01-13 DIAGNOSIS — I160 Hypertensive urgency: Secondary | ICD-10-CM | POA: Diagnosis not present

## 2021-01-13 LAB — CBC
HCT: 23.9 % — ABNORMAL LOW (ref 36.0–46.0)
Hemoglobin: 7.3 g/dL — ABNORMAL LOW (ref 12.0–15.0)
MCH: 24.2 pg — ABNORMAL LOW (ref 26.0–34.0)
MCHC: 30.5 g/dL (ref 30.0–36.0)
MCV: 79.1 fL — ABNORMAL LOW (ref 80.0–100.0)
Platelets: 503 10*3/uL — ABNORMAL HIGH (ref 150–400)
RBC: 3.02 MIL/uL — ABNORMAL LOW (ref 3.87–5.11)
RDW: 17.6 % — ABNORMAL HIGH (ref 11.5–15.5)
WBC: 8 10*3/uL (ref 4.0–10.5)
nRBC: 0 % (ref 0.0–0.2)

## 2021-01-13 LAB — BASIC METABOLIC PANEL
Anion gap: 3 — ABNORMAL LOW (ref 5–15)
BUN: 18 mg/dL (ref 8–23)
CO2: 24 mmol/L (ref 22–32)
Calcium: 8.4 mg/dL — ABNORMAL LOW (ref 8.9–10.3)
Chloride: 107 mmol/L (ref 98–111)
Creatinine, Ser: 1.31 mg/dL — ABNORMAL HIGH (ref 0.44–1.00)
GFR, Estimated: 39 mL/min — ABNORMAL LOW (ref >60)
Glucose, Bld: 93 mg/dL (ref 70–99)
Potassium: 4 mmol/L (ref 3.5–5.1)
Sodium: 134 mmol/L — ABNORMAL LOW (ref 135–145)

## 2021-01-13 LAB — MAGNESIUM: Magnesium: 2.2 mg/dL (ref 1.7–2.4)

## 2021-01-13 LAB — PREPARE RBC (CROSSMATCH)

## 2021-01-13 LAB — ABO/RH: ABO/RH(D): A NEG

## 2021-01-13 LAB — OCCULT BLOOD X 1 CARD TO LAB, STOOL: Fecal Occult Bld: NEGATIVE

## 2021-01-13 MED ORDER — FUROSEMIDE 20 MG PO TABS
20.0000 mg | ORAL_TABLET | Freq: Every day | ORAL | Status: DC
  Filled 2021-01-13: qty 1

## 2021-01-13 MED ORDER — SODIUM CHLORIDE 0.9% IV SOLUTION: Freq: Once | INTRAVENOUS | Status: AC

## 2021-01-13 MED ORDER — SODIUM CHLORIDE 0.9 % IV SOLN
250.0000 mg | INTRAVENOUS | Status: DC
  Administered 2021-01-13: 250 mg via INTRAVENOUS
  Filled 2021-01-13: qty 20

## 2021-01-13 MED ORDER — NA FERRIC GLUC CPLX IN SUCROSE 12.5 MG/ML IV SOLN
250.0000 mg | INTRAVENOUS | Status: DC
Start: 2021-01-13 — End: 2021-01-13

## 2021-01-13 NOTE — Care Management CC44 (Signed)
Condition Code 44 Documentation Completed  Patient Details  Name: DARLEENE CUMPIAN MRN: [id number] Date of Birth: 21-Jul-1930   Condition Code 44 given:  Yes Patient signature on Condition Code 44 notice:  Yes Documentation of 2 MD's agreement:  Yes Code 44 added to claim:  Yes    Alberteen Sam, LCSW 01/13/2021, 2:05 PM

## 2021-01-13 NOTE — Evaluation (Signed)
Physical Therapy Evaluation Patient Details Name: Frances Kent MRN: [id number] DOB: 1930-11-08 Today's Date: 01/13/2021  History of Present Illness  Pt is an 87 y.o. Caucasian female with PMH significant for recent CVA/CAS s/p revascularization, paroxysmal atrial fibrillation, GERD, HTN, hypothyroidism, CHF, mitral regurgitation, macular degenration, PE and SVT, who presented to the ER with acute onset of worsening dyspnea. She admits to bilateral lower extremity edema as well as orthopnea and paroxysmal nocturnal dyspnea with dyspnea on exertion. MD assessment includes: aproxysmal atrial fibrillation with RVR with associated SVT, acute on chronic CHF, and acute on chronic anemia.   Clinical Impression  Pt was pleasant and motivated to participate during the session and put forth good effort throughout. Pt was able to complete all ther ex in bed, EOB and standing. Pt was able to complete bed mobility with supervision and STS with min guard for safety. Pt was able to take forward and backward steps with min guard for safety and min cuing for stepping sequence. Pt was able to ambulate in the room with min guard for safety and no adverse symptoms or LOB. SpO2 and HR remained WNL throughout the session. Pt will benefit from HHPT upon discharge to safely address deficits listed in patient problem list for decreased caregiver assistance and eventual return to PLOF.   Recommendations for follow up therapy are one component of a multi-disciplinary discharge planning process, led by the attending physician.  Recommendations may be updated based on patient status, additional functional criteria and insurance authorization.  Follow Up Recommendations Home health PT    Assistance Recommended at Discharge Frequent or constant Supervision/Assistance  Functional Status Assessment Patient has had a recent decline in their functional status and demonstrates the ability to make significant improvements in  function in a reasonable and predictable amount of time.  Equipment Recommendations  None recommended by PT    Recommendations for Other Services       Precautions / Restrictions Precautions Precautions: Fall Restrictions Weight Bearing Restrictions: No      Mobility  Bed Mobility Overal bed mobility: Needs Assistance Bed Mobility: Supine to Sit     Supine to sit: Supervision;HOB elevated Sit to supine: Supervision   General bed mobility comments: Supervision for safety, did not require any physical assistance    Transfers Overall transfer level: Needs assistance Equipment used: Rolling walker (2 wheels) Transfers: Sit to/from Stand Sit to Stand: Min guard           General transfer comment: Increased time and effort, min guard for safety    Ambulation/Gait Ambulation/Gait assistance: Min guard Gait Distance (Feet): 15 Feet x1, 3 Feet x4 Assistive device: Rolling walker (2 wheels) Gait Pattern/deviations: Step-through pattern;Decreased step length - right;Decreased step length - left;Decreased stride length Gait velocity: decreased     General Gait Details: Increased time and effort, min guard for safety, min cuing for RW proximity  Stairs            Wheelchair Mobility    Modified Rankin (Stroke Patients Only)       Balance Overall balance assessment: Needs assistance Sitting-balance support: No upper extremity supported;Feet supported Sitting balance-Leahy Scale: Good Sitting balance - Comments: Good sitting balance reaching within BOS at EOB   Standing balance support: Bilateral upper extremity supported;During functional activity Standing balance-Leahy Scale: Good                             Pertinent Vitals/Pain Pain Assessment: No/denies  pain    Home Living Family/patient expects to be discharged to:: Private residence Living Arrangements: Children (Daughter) Available Help at Discharge: Family;Available 24  hours/day Type of Home: House Home Access: Level entry     Alternate Level Stairs-Number of Steps: N/A Home Layout: Laundry or work area in basement;Able to live on main level with bedroom/bathroom;Two level Home Equipment: Conservation officer, nature (2 wheels);Rollator (4 wheels);BSC/3in1;Wheelchair - manual;Grab bars - tub/shower;Shower seat - built in;Shower seat      Prior Function Prior Level of Function : Independent/Modified Independent             Mobility Comments: RW for household mobility, recently discharged from [place], no falls in the past 6 months or past year ADLs Comments: Independent with ADLs, family assists with IADLs     Hand Dominance   Dominant Hand: Left    Extremity/Trunk Assessment   Upper Extremity Assessment Upper Extremity Assessment: Generalized weakness    Lower Extremity Assessment Lower Extremity Assessment: Generalized weakness    Cervical / Trunk Assessment Cervical / Trunk Assessment: Normal  Communication   Communication: No difficulties  Cognition Arousal/Alertness: Awake/alert Behavior During Therapy: WFL for tasks assessed/performed Overall Cognitive Status: Within Functional Limits for tasks assessed                                          General Comments General comments (skin integrity, edema, etc.): HR 50-60bpm throughout    Exercises Total Joint Exercises Ankle Circles/Pumps: AROM;Strengthening;Both;10 reps;Supine Quad Sets: AROM;Strengthening;Both;10 reps;Supine Gluteal Sets: AROM;Strengthening;Both;10 reps;Supine Long Arc Quad: AROM;Strengthening;Both;20 reps;Seated;Other (comment) (second set with resistance) Marching in Standing: AROM;Strengthening;Both;10 reps;Standing Practice with forwards and backwards walking x4   Assessment/Plan    PT Assessment Patient needs continued PT services  PT Problem List Decreased balance;Decreased mobility;Decreased knowledge of use of DME;Decreased strength       PT  Treatment Interventions DME instruction;Gait training;Stair training;Functional mobility training;Therapeutic activities;Therapeutic exercise;Balance training;Patient/family education    PT Goals (Current goals can be found in the Care Plan section)  Acute Rehab PT Goals Patient Stated Goal: to get strength back and go home PT Goal Formulation: With patient Time For Goal Achievement: 01/26/21 Potential to Achieve Goals: Good    Frequency Min 2X/week   Barriers to discharge        Co-evaluation               AM-PAC PT "6 Clicks" Mobility  Outcome Measure Help needed turning from your back to your side while in a flat bed without using bedrails?: None Help needed moving from lying on your back to sitting on the side of a flat bed without using bedrails?: None Help needed moving to and from a bed to a chair (including a wheelchair)?: A Little Help needed standing up from a chair using your arms (e.g., wheelchair or bedside chair)?: A Little Help needed to walk in hospital room?: A Little Help needed climbing 3-5 steps with a railing? : A Little 6 Click Score: 20    End of Session Equipment Utilized During Treatment: Gait belt Activity Tolerance: Patient limited by fatigue Patient left: in chair;with call bell/phone within reach;with family/visitor present (Daughter, chair alarm had dead batteries) Nurse Communication: Mobility status;Other (comment) (Nursing notified about chair alarm batteries) PT Visit Diagnosis: Difficulty in walking, not elsewhere classified (R26.2);Muscle weakness (generalized) (M62.81)    Time: 2595-6387 PT Time Calculation (min) (ACUTE ONLY): 28  min   Charges:             Sheldon Silvan SPT 01/13/21, 4:55 PM

## 2021-01-13 NOTE — Progress Notes (Signed)
PROGRESS NOTE  Frances Kent HYH:[id number] DOB: 1930-11-19   PCP: [name], MD  Patient is from: Home  DOA: 01/12/2021 LOS: 1  Chief complaints:  Chief Complaint  Patient presents with   Shortness of Breath     Brief Narrative / Interim history: 87 year old F with PMH of recent CVA/CAS s/p revascularization, paroxysmal A. Fib on Xarelto, systolic CHF, severe MVR, SVT, PE, HTN, hypothyroidism and GERD presenting with intermittent shortness of breath for 56-month that has acutely gotten worse for 1 day.  She also had edema, orthopnea and PND.  BNP elevated to 1900.  CXR consistent with CHF.  She had markedly elevated blood pressure.  Admitted for A. fib with RVR and acute on chronic systolic CHF.  Started on IV diuretics and Cardizem drip but converted to normal sinus rhythm.  Cardiology consulted and recommended resuming home amiodarone, Toprol-XL and p.o. Lasix.   Subjective: Seen and examined earlier this morning.  No major events overnight of this morning.  [name] at bedside.  Reports improvement in edema and breathing but feels fatigued.  Hgb down to 7.3 (baseline about 9).  Patient denies melena or hematochezia.  She is on Xarelto.  Anemia panel consistent with iron deficiency.  She denies chest pain, GI or UTI symptoms.  We discussed about blood transfusion and iron infusion.  [name] is a skeptical about blood transfusion but patient wants to proceed with blood transfusion after risk-benefit discussion.  Objective: Vitals:   01/13/21 0003 01/13/21 0335 01/13/21 0754 01/13/21 1119  BP: (!) 144/54 (!) 129/53 (!) 140/57 (!) 118/52  Pulse: (!) 57 (!) 58 64 (!) 55  Resp: 17 20 16 16   Temp: 98.8 F (37.1 C) 98.1 F (36.7 C) 98.8 F (37.1 C) 98 F (36.7 C)  TempSrc: Oral Oral    SpO2: 100% 93% 98% 97%  Weight:  53.1 kg    Height:        Intake/Output Summary (Last 24 hours) at 01/13/2021 1412 Last data filed at 01/13/2021 1020 Gross per 24  hour  Intake 420 ml  Output 300 ml  Net 120 ml   Filed Weights   01/12/21 0955 01/12/21 1800 01/13/21 0335  Weight: 53.5 kg 53.2 kg 53.1 kg    Examination:  GENERAL: No apparent distress.  Nontoxic. HEENT: MMM.  Vision and hearing grossly intact.  NECK: Supple.  No apparent JVD.  RESP: 97% on 2 L.  No IWOB.  Fair aeration bilaterally. CVS:  RRR. Heart sounds normal.  ABD/GI/GU: BS+. Abd soft, NTND.  MSK/EXT:  Moves extremities. No apparent deformity.  Trace edema mainly in RLE. SKIN: no apparent skin lesion or wound NEURO: Awake, alert and oriented appropriately.  No apparent focal neuro deficit. PSYCH: Calm. Normal affect.   Procedures:  None  Microbiology summarized: QASUO-15 and influenza PCR nonreactive.  Assessment & Plan: Paroxysmal A. fib with RVR: Converted to sinus rhythm.  Could be exacerbated by CHF or vice versa. -Continue p.o. amiodarone, Toprol-XL and Xarelto per cardiology  Acute on chronic systolic CHF: Patient with cardinal symptoms including dyspnea, edema, orthopnea PND.  Could be exacerbated by A. fib or anemia.  Reports improvement in edema and dyspnea but feels fatigued.  Received IV Lasix in ED. I&O incomplete. -Cardiology recommends resuming home p.o. Lasix -Monitor fluid status, renal functions and electrolytes. -Manage anemia as below  Symptomatic iron deficiency anemia: Patient denies melena hematochezia.  She is on Xarelto.  Anemia panel with iron deficiency. Recent Labs  11/01/20 2209 11/02/20 0509 11/03/20 0118 11/04/20 0325 11/05/20 0519 11/12/20 0504 12/07/20 1200 01/12/21 0955 01/12/21 1559 01/13/21 0603  HGB 10.2* 9.0* 9.2* 9.0* 9.0* 10.3* 9.2 Repeated and verified X2.* 7.7* 8.2* 7.3*  -Check Hemoccult -Transfuse 1 unit-patient consented after risk and benefit discussion -IV ferric gluconate 250 mg every 48 hours x2. -Monitor H&H  CKD-3A?  Slight creatinine bump after diuretics. Recent Labs    11/03/20 0118 11/04/20 0325  11/05/20 0519 11/08/20 1306 11/10/20 0519 11/11/20 0547 11/12/20 0504 12/07/20 1200 01/12/21 0955 01/12/21 1559 01/13/21 0603  BUN 12 9 9 11 15 14 11  32* 18  --  18  CREATININE 0.94 0.93 0.90 1.06* 1.04* 1.06* 0.90 1.25* 1.11* 1.18* 1.31*  -Change IV Lasix to p.o.  Hypertensive urgency, likely from CHF.  Improved. -Continue home Toprol-XL and p.o. Lasix She will also be placed on as needed IV labetalol.  Hypothyroidism -Continue home Synthroid.  History of CVA/carotid artery stenosis s/p embolization in 10/2020 Hyperlipidemia -Continue home meds.  Generalized weakness/physical deconditioning -PT/OT.   Body mass index is 20.74 kg/m.         DVT prophylaxis:   Rivaroxaban (XARELTO) tablet 15 mg  Code Status: Full code Family Communication: Updated patient's daughter at bedside. Level of care: Telemetry Cardiac Status is: Observation  The patient remains OBS appropriate and will d/c before 2 midnights.      Consultants:  Cardiology   Sch Meds:  Scheduled Meds:  sodium chloride   Intravenous Once   amiodarone  100 mg Oral Daily   calcium carbonate  1 tablet Oral BID WC   furosemide  40 mg Intravenous Q12H   levothyroxine  50 mcg Oral Q0600   metoprolol succinate  12.5 mg Oral q morning   multivitamin with minerals  1 tablet Oral Daily   Rivaroxaban  15 mg Oral Q supper   Continuous Infusions:  diltiazem (CARDIZEM) infusion 5 mg/hr (01/12/21 1433)   ferric gluconate (FERRLECIT) IVPB 250 mg (01/13/21 1153)   PRN Meds:.acetaminophen **OR** acetaminophen, ALPRAZolam, ipratropium, labetalol, magnesium hydroxide, ondansetron **OR** ondansetron (ZOFRAN) IV, polyethylene glycol, traZODone  Antimicrobials: Anti-infectives (From admission, onward)    None        I have personally reviewed the following labs and images: CBC: Recent Labs  Lab 01/12/21 0955 01/12/21 1559 01/13/21 0603  WBC 7.1 6.1 8.0  HGB 7.7* 8.2* 7.3*  HCT 25.9* 27.4* 23.9*   MCV 82.7 82.5 79.1*  PLT 509* 457* 503*   BMP &GFR Recent Labs  Lab 01/12/21 0955 01/12/21 1559 01/13/21 0603  NA 137  --  134*  K 4.5  --  4.0  CL 111  --  107  CO2 20*  --  24  GLUCOSE 98  --  93  BUN 18  --  18  CREATININE 1.11* 1.18* 1.31*  CALCIUM 8.7*  --  8.4*  MG  --   --  2.2   Estimated Creatinine Clearance: 24.6 mL/min (A) (by C-G formula based on SCr of 1.31 mg/dL (H)). Liver & Pancreas: Recent Labs  Lab 01/12/21 1343  AST 52*  ALT 56*  ALKPHOS 96  BILITOT 1.0  PROT 6.7  ALBUMIN 3.3*   No results for input(s): LIPASE, AMYLASE in the last 168 hours. No results for input(s): AMMONIA in the last 168 hours. Diabetic: No results for input(s): HGBA1C in the last 72 hours. No results for input(s): GLUCAP in the last 168 hours. Cardiac Enzymes: No results for input(s): CKTOTAL, CKMB, CKMBINDEX, TROPONINI in  the last 168 hours. No results for input(s): PROBNP in the last 8760 hours. Coagulation Profile: No results for input(s): INR, PROTIME in the last 168 hours. Thyroid Function Tests: No results for input(s): TSH, T4TOTAL, FREET4, T3FREE, THYROIDAB in the last 72 hours. Lipid Profile: No results for input(s): CHOL, HDL, LDLCALC, TRIG, CHOLHDL, LDLDIRECT in the last 72 hours. Anemia Panel: Recent Labs    01/12/21 1559  VITAMINB12 394  FOLATE 30.0  FERRITIN 31  TIBC 486*  IRON 24*  RETICCTPCT 2.1   Urine analysis:    Component Value Date/Time   COLORURINE YELLOW (A) 09/08/2020 0105   APPEARANCEUR HAZY (A) 09/08/2020 0105   APPEARANCEUR Clear 02/05/2013 1630   LABSPEC 1.024 09/08/2020 0105   LABSPEC 1.009 02/05/2013 1630   PHURINE 6.0 09/08/2020 0105   GLUCOSEU NEGATIVE 09/08/2020 0105   GLUCOSEU NEGATIVE 04/04/2018 1149   HGBUR NEGATIVE 09/08/2020 0105   BILIRUBINUR NEGATIVE 09/08/2020 0105   BILIRUBINUR 1 08/31/2017 1157   BILIRUBINUR Negative 02/05/2013 1630   KETONESUR 5 (A) 09/08/2020 0105   PROTEINUR 100 (A) 09/08/2020 0105    UROBILINOGEN 1.0 04/04/2018 1149   NITRITE NEGATIVE 09/08/2020 0105   LEUKOCYTESUR NEGATIVE 09/08/2020 0105   LEUKOCYTESUR 1+ 02/05/2013 1630   Sepsis Labs: Invalid input(s): PROCALCITONIN, [place]  Microbiology: Recent Results (from the past 240 hour(s))  Resp Panel by RT-PCR (Flu A&B, Covid) Nasopharyngeal Swab     Status: None   Collection Time: 01/12/21  3:59 PM   Specimen: Nasopharyngeal Swab; Nasopharyngeal(NP) swabs in vial transport medium  Result Value Ref Range Status   SARS Coronavirus 2 by RT PCR NEGATIVE NEGATIVE Final    Comment: (NOTE) SARS-CoV-2 target nucleic acids are NOT DETECTED.  The SARS-CoV-2 RNA is generally detectable in upper respiratory specimens during the acute phase of infection. The lowest concentration of SARS-CoV-2 viral copies this assay can detect is 138 copies/mL. A negative result does not preclude SARS-Cov-2 infection and should not be used as the sole basis for treatment or other patient management decisions. A negative result may occur with  improper specimen collection/handling, submission of specimen other than nasopharyngeal swab, presence of viral mutation(s) within the areas targeted by this assay, and inadequate number of viral copies(<138 copies/mL). A negative result must be combined with clinical observations, patient history, and epidemiological information. The expected result is Negative.  Fact Sheet for Patients:  EntrepreneurPulse.com.au  Fact Sheet for Healthcare Providers:  IncredibleEmployment.be  This test is no t yet approved or cleared by the [place] FDA and  has been authorized for detection and/or diagnosis of SARS-CoV-2 by FDA under an Emergency Use Authorization (EUA). This EUA will remain  in effect (meaning this test can be used) for the duration of the COVID-19 declaration under Section 564(b)(1) of the Act, 21 U.S.C.section 360bbb-3(b)(1), unless the authorization  is terminated  or revoked sooner.       Influenza A by PCR NEGATIVE NEGATIVE Final   Influenza B by PCR NEGATIVE NEGATIVE Final    Comment: (NOTE) The Xpert Xpress SARS-CoV-2/FLU/RSV plus assay is intended as an aid in the diagnosis of influenza from Nasopharyngeal swab specimens and should not be used as a sole basis for treatment. Nasal washings and aspirates are unacceptable for Xpert Xpress SARS-CoV-2/FLU/RSV testing.  Fact Sheet for Patients: EntrepreneurPulse.com.au  Fact Sheet for Healthcare Providers: IncredibleEmployment.be  This test is not yet approved or cleared by the [place] FDA and has been authorized for detection and/or diagnosis of SARS-CoV-2 by FDA under an Emergency  Use Authorization (EUA). This EUA will remain in effect (meaning this test can be used) for the duration of the COVID-19 declaration under Section 564(b)(1) of the Act, 21 U.S.C. section 360bbb-3(b)(1), unless the authorization is terminated or revoked.  Performed at [facility], [address] [place], [place] [address]     Radiology Studies: No results found.    Linsey Hirota [address] [address]  If 7PM-7AM, please contact night-coverage [email or web address] 01/13/2021, 2:12 PM

## 2021-01-13 NOTE — Care Management Obs Status (Signed)
MEDICARE OBSERVATION STATUS NOTIFICATION   Patient Details  Name: Frances Kent MRN: [id number] Date of Birth: 26-Dec-1930   Medicare Observation Status Notification Given:  Yes    Alberteen Sam, LCSW 01/13/2021, 2:05 PM

## 2021-01-13 NOTE — Evaluation (Signed)
Occupational Therapy Evaluation Patient Details Name: Frances Kent MRN: [id number] DOB: 22-Apr-1930 Today's Date: 01/13/2021   History of Present Illness 87 year old F with PMH of recent CVA/CAS s/p revascularization, A. Fib, systolic CHF, PE, HTN, hypothyroidism, and GERD who presented to [facility] with intermittent shortness of breath for 16-month that has acutely gotten worse for 1 day.  She also had edema and orthopnea. Pt admitted for A. fib with RVR and acute on chronic systolic CHF.  Started on IV diuretics and Cardizem drip but converted to normal sinus rhythm.   Clinical Impression   Pt seen for OT evaluation this date. Prior to admission, pt was independent in all ADLs, using a RW for functional mobility of household distances, and living on the main floor of a 2-story home with daughter. Pt currently presents with decreased activity tolerance, and requires SET-UP assist for bed-level LB dressing, SUPERVISION for bed mobility, SUPERVISION for functional mobility/transfers, and SUPERVISION for x1 standing grooming task. Pt would benefit from additional skilled OT services to promote the incorporation of energy conservation strategies into daily routine and to maximize return to PLOF. Upon discharge, recommend no OT follow-up.      Recommendations for follow up therapy are one component of a multi-disciplinary discharge planning process, led by the attending physician.  Recommendations may be updated based on patient status, additional functional criteria and insurance authorization.   Follow Up Recommendations  No OT follow up    Assistance Recommended at Discharge Intermittent Supervision/Assistance  Functional Status Assessment  Patient has had a recent decline in their functional status and demonstrates the ability to make significant improvements in function in a reasonable and predictable amount of time.  Equipment Recommendations  None recommended by OT       Precautions /  Restrictions Precautions Precautions: Fall Restrictions Weight Bearing Restrictions: No      Mobility Bed Mobility Overal bed mobility: Needs Assistance Bed Mobility: Supine to Sit;Sit to Supine     Supine to sit: Supervision;HOB elevated Sit to supine: Supervision        Transfers Overall transfer level: Needs assistance Equipment used: Rolling walker (2 wheels) Transfers: Sit to/from Stand Sit to Stand: Supervision           General transfer comment: from bed & toilet. Pt requires increase time/effort, however requires no physical assistance and demonstrates safe UE placement with RW use      Balance Overall balance assessment: Needs assistance Sitting-balance support: No upper extremity supported;Feet supported Sitting balance-Leahy Scale: Good Sitting balance - Comments: Good sitting balance reaching within BOS at EOB   Standing balance support: No upper extremity supported;During functional activity Standing balance-Leahy Scale: Good Standing balance comment: Good standing balance reaching outside BOS to obtain supplies for standing hand hygiene                           ADL either performed or assessed with clinical judgement   ADL Overall ADL's : Needs assistance/impaired     Grooming: Wash/dry hands;Supervision/safety;Standing               Lower Body Dressing: Set up;Bed level Lower Body Dressing Details (indicate cue type and reason): to don/doff socks Toilet Transfer: Supervision/safety;Ambulation;Regular Toilet;Rolling walker (2 wheels)           Functional mobility during ADLs: Supervision/safety;Rolling walker (2 wheels)       Vision Baseline Vision/History: 1 Wears glasses (reading glasses) Ability to See in Adequate Light: 0  Adequate Patient Visual Report: No change from baseline              Pertinent Vitals/Pain Pain Assessment: No/denies pain     Hand Dominance Left   Extremity/Trunk Assessment Upper  Extremity Assessment Upper Extremity Assessment: Overall WFL for tasks assessed   Lower Extremity Assessment Lower Extremity Assessment: Generalized weakness   Cervical / Trunk Assessment Cervical / Trunk Assessment: Normal   Communication     Cognition Arousal/Alertness: Awake/alert Behavior During Therapy: WFL for tasks assessed/performed Overall Cognitive Status: Within Functional Limits for tasks assessed                                       General Comments  HR 50-60bpm throughout            Home Living Family/patient expects to be discharged to:: Private residence Living Arrangements: Children Available Help at Discharge: Family;Available 24 hours/day Type of Home: House Home Access: Level entry     Home Layout: Laundry or work area in basement;Able to live on main level with bedroom/bathroom     Bathroom Shower/Tub: Occupational psychologist: Standard     Home Equipment: Conservation officer, nature (2 wheels);Rollator (4 wheels);BSC/3in1;Wheelchair - manual;Grab bars - tub/shower;Shower seat - built in;Shower seat;Hand held shower head          Prior Functioning/Environment Prior Level of Function : Independent/Modified Independent             Mobility Comments: 2WW for household mobility. Recently discharges from [place]. No falls in the past 6 months ADLs Comments: Independent with ADLs. Family assists with IADLs        OT Problem List: Decreased strength;Decreased activity tolerance;Impaired balance (sitting and/or standing)      OT Treatment/Interventions: Self-care/ADL training;Therapeutic exercise;Energy conservation;DME and/or AE instruction;Therapeutic activities;Patient/family education;Balance training    OT Goals(Current goals can be found in the care plan section) Acute Rehab OT Goals Patient Stated Goal: to be able to walk around kitchen without fatigue OT Goal Formulation: With patient Time For Goal Achievement:  01/27/21 Potential to Achieve Goals: Good ADL Goals Pt Will Perform Grooming: with modified independence;standing (performing at least 3 standing grooming tasks) Pt Will Perform Lower Body Dressing: with modified independence;sit to/from stand Pt Will Transfer to Toilet: with modified independence;ambulating;regular height toilet  OT Frequency: Min 2X/week    AM-PAC OT "6 Clicks" Daily Activity     Outcome Measure Help from another person eating meals?: None Help from another person taking care of personal grooming?: A Little Help from another person toileting, which includes using toliet, bedpan, or urinal?: A Little Help from another person bathing (including washing, rinsing, drying)?: A Little Help from another person to put on and taking off regular upper body clothing?: None Help from another person to put on and taking off regular lower body clothing?: A Little 6 Click Score: 20   End of Session Equipment Utilized During Treatment: Rolling walker (2 wheels);Gait belt Nurse Communication: Mobility status  Activity Tolerance: Patient tolerated treatment well Patient left: in bed;with call bell/phone within reach;with bed alarm set;with family/visitor present  OT Visit Diagnosis: Muscle weakness (generalized) (M62.81)                Time: 1610-9604 OT Time Calculation (min): 23 min Charges:  OT General Charges $OT Visit: 1 Visit OT Evaluation $OT Eval Moderate Complexity: 1 Mod OT Treatments $Self Care/Home Management :  8-22 mins  Fredirick Maudlin, OTR/L [place]

## 2021-01-13 NOTE — Consult Note (Signed)
   Heart Failure Nurse Navigator Note  HFrEF 40 to 45%.  Severe mitral regurgitation.  She presented to the emergency room with complaints of worsening shortness of breath, fatigue and lower extremity edema.  She had recently been seen by cardiology and instructed to take her Lasix for 5 days but daughter states that she only took it for 3.  Comorbidities:  Paroxysmal atrial fibrillation GERD Hypertension Hypothyroidism History of pulmonary embolus SVT Anemia Embolic stroke  Labs:  Sodium 134, potassium 4, chloride 107, CO2 24, BUN 18, creatinine 1.3, magnesium 2.2, hemoglobin 7.3 mm 23.9 Weight is 53.1 kg Blood pressure 118/52  Medications:  Amiodarone 100 mg daily Furosemide 40 mg IV every 12 hours Metoprolol succinate 12.5 milligrams daily Xarelto 15 mg with supper   Initial meeting with the patient and her daughter him with whom she lives with.  The patient states that she does not weigh herself on a daily basis.  Explained the reasoning behind weighing daily and recording and what to report to physician.  She voices understanding.  The daughter states that she wears TED hose and that the swelling had had gotten to the point that they had her in the largest pair of Ted socks that they had.  Discussed fluid restriction and daughter and patient both did not feel she went over 64 ounces in a days time.  Discussed diet.  She states once a week that she likes to have a sausage patty for breakfast, she does like to have a also scrambled eggs and toast but does not add salt.  Discussed follow-up in the outpatient heart failure clinic has an appointment on December 15 at 1:30 in the afternoon.  She was given the living with heart failure teaching booklet along with information on low-sodium and zone magnet she is also given information on the outpatient heart failure clinic.  Pricilla Riffle RN [facility]

## 2021-01-14 DIAGNOSIS — Z79899 Other long term (current) drug therapy: Secondary | ICD-10-CM | POA: Diagnosis not present

## 2021-01-14 DIAGNOSIS — Z20822 Contact with and (suspected) exposure to covid-19: Secondary | ICD-10-CM | POA: Diagnosis present

## 2021-01-14 DIAGNOSIS — I452 Bifascicular block: Secondary | ICD-10-CM | POA: Diagnosis present

## 2021-01-14 DIAGNOSIS — Z823 Family history of stroke: Secondary | ICD-10-CM | POA: Diagnosis not present

## 2021-01-14 DIAGNOSIS — I160 Hypertensive urgency: Secondary | ICD-10-CM | POA: Diagnosis not present

## 2021-01-14 DIAGNOSIS — H3530 Unspecified macular degeneration: Secondary | ICD-10-CM | POA: Diagnosis present

## 2021-01-14 DIAGNOSIS — I340 Nonrheumatic mitral (valve) insufficiency: Secondary | ICD-10-CM

## 2021-01-14 DIAGNOSIS — I482 Chronic atrial fibrillation, unspecified: Secondary | ICD-10-CM | POA: Diagnosis not present

## 2021-01-14 DIAGNOSIS — I509 Heart failure, unspecified: Secondary | ICD-10-CM | POA: Diagnosis not present

## 2021-01-14 DIAGNOSIS — I4819 Other persistent atrial fibrillation: Secondary | ICD-10-CM | POA: Diagnosis not present

## 2021-01-14 DIAGNOSIS — Z79890 Hormone replacement therapy (postmenopausal): Secondary | ICD-10-CM | POA: Diagnosis not present

## 2021-01-14 DIAGNOSIS — Z800 Family history of malignant neoplasm of digestive organs: Secondary | ICD-10-CM | POA: Diagnosis not present

## 2021-01-14 DIAGNOSIS — R1310 Dysphagia, unspecified: Secondary | ICD-10-CM | POA: Diagnosis not present

## 2021-01-14 DIAGNOSIS — Z86711 Personal history of pulmonary embolism: Secondary | ICD-10-CM | POA: Diagnosis not present

## 2021-01-14 DIAGNOSIS — I5023 Acute on chronic systolic (congestive) heart failure: Secondary | ICD-10-CM | POA: Diagnosis not present

## 2021-01-14 DIAGNOSIS — Z7901 Long term (current) use of anticoagulants: Secondary | ICD-10-CM | POA: Diagnosis not present

## 2021-01-14 DIAGNOSIS — I471 Supraventricular tachycardia: Secondary | ICD-10-CM | POA: Diagnosis present

## 2021-01-14 DIAGNOSIS — Z803 Family history of malignant neoplasm of breast: Secondary | ICD-10-CM | POA: Diagnosis not present

## 2021-01-14 DIAGNOSIS — I480 Paroxysmal atrial fibrillation: Secondary | ICD-10-CM | POA: Diagnosis present

## 2021-01-14 DIAGNOSIS — R636 Underweight: Secondary | ICD-10-CM

## 2021-01-14 DIAGNOSIS — I4891 Unspecified atrial fibrillation: Secondary | ICD-10-CM | POA: Diagnosis not present

## 2021-01-14 DIAGNOSIS — K219 Gastro-esophageal reflux disease without esophagitis: Secondary | ICD-10-CM | POA: Diagnosis present

## 2021-01-14 DIAGNOSIS — Z8679 Personal history of other diseases of the circulatory system: Secondary | ICD-10-CM | POA: Diagnosis not present

## 2021-01-14 DIAGNOSIS — Z8673 Personal history of transient ischemic attack (TIA), and cerebral infarction without residual deficits: Secondary | ICD-10-CM

## 2021-01-14 DIAGNOSIS — I071 Rheumatic tricuspid insufficiency: Secondary | ICD-10-CM

## 2021-01-14 DIAGNOSIS — I130 Hypertensive heart and chronic kidney disease with heart failure and stage 1 through stage 4 chronic kidney disease, or unspecified chronic kidney disease: Secondary | ICD-10-CM | POA: Diagnosis present

## 2021-01-14 DIAGNOSIS — E039 Hypothyroidism, unspecified: Secondary | ICD-10-CM | POA: Diagnosis not present

## 2021-01-14 DIAGNOSIS — R5381 Other malaise: Secondary | ICD-10-CM | POA: Diagnosis not present

## 2021-01-14 DIAGNOSIS — Z888 Allergy status to other drugs, medicaments and biological substances status: Secondary | ICD-10-CM | POA: Diagnosis not present

## 2021-01-14 DIAGNOSIS — R0602 Shortness of breath: Secondary | ICD-10-CM | POA: Diagnosis present

## 2021-01-14 DIAGNOSIS — I081 Rheumatic disorders of both mitral and tricuspid valves: Secondary | ICD-10-CM | POA: Diagnosis present

## 2021-01-14 DIAGNOSIS — E785 Hyperlipidemia, unspecified: Secondary | ICD-10-CM | POA: Diagnosis present

## 2021-01-14 DIAGNOSIS — N1831 Chronic kidney disease, stage 3a: Secondary | ICD-10-CM | POA: Diagnosis not present

## 2021-01-14 DIAGNOSIS — D649 Anemia, unspecified: Secondary | ICD-10-CM | POA: Diagnosis not present

## 2021-01-14 DIAGNOSIS — Z8249 Family history of ischemic heart disease and other diseases of the circulatory system: Secondary | ICD-10-CM | POA: Diagnosis not present

## 2021-01-14 DIAGNOSIS — D509 Iron deficiency anemia, unspecified: Secondary | ICD-10-CM | POA: Diagnosis not present

## 2021-01-14 LAB — CBC
HCT: 26.2 % — ABNORMAL LOW (ref 36.0–46.0)
Hemoglobin: 8.3 g/dL — ABNORMAL LOW (ref 12.0–15.0)
MCH: 25 pg — ABNORMAL LOW (ref 26.0–34.0)
MCHC: 31.7 g/dL (ref 30.0–36.0)
MCV: 78.9 fL — ABNORMAL LOW (ref 80.0–100.0)
Platelets: 480 10*3/uL — ABNORMAL HIGH (ref 150–400)
RBC: 3.32 MIL/uL — ABNORMAL LOW (ref 3.87–5.11)
RDW: 16.9 % — ABNORMAL HIGH (ref 11.5–15.5)
WBC: 8.6 10*3/uL (ref 4.0–10.5)
nRBC: 0.2 % (ref 0.0–0.2)

## 2021-01-14 LAB — TYPE AND SCREEN
ABO/RH(D): A NEG
Antibody Screen: NEGATIVE
Unit division: 0

## 2021-01-14 LAB — RENAL FUNCTION PANEL
Albumin: 3.1 g/dL — ABNORMAL LOW (ref 3.5–5.0)
Anion gap: 5 (ref 5–15)
BUN: 22 mg/dL (ref 8–23)
CO2: 22 mmol/L (ref 22–32)
Calcium: 8.2 mg/dL — ABNORMAL LOW (ref 8.9–10.3)
Chloride: 108 mmol/L (ref 98–111)
Creatinine, Ser: 1.21 mg/dL — ABNORMAL HIGH (ref 0.44–1.00)
GFR, Estimated: 43 mL/min — ABNORMAL LOW (ref >60)
Glucose, Bld: 92 mg/dL (ref 70–99)
Phosphorus: 3.1 mg/dL (ref 2.5–4.6)
Potassium: 3.6 mmol/L (ref 3.5–5.1)
Sodium: 135 mmol/L (ref 135–145)

## 2021-01-14 LAB — BPAM RBC
Blood Product Expiration Date: 202212072359
ISSUE DATE / TIME: 202211301545
Unit Type and Rh: 600

## 2021-01-14 LAB — TSH: TSH: 4.583 u[IU]/mL — ABNORMAL HIGH (ref 0.350–4.500)

## 2021-01-14 LAB — MAGNESIUM: Magnesium: 2.4 mg/dL (ref 1.7–2.4)

## 2021-01-14 LAB — BRAIN NATRIURETIC PEPTIDE: B Natriuretic Peptide: 571.3 pg/mL — ABNORMAL HIGH (ref 0.0–100.0)

## 2021-01-14 MED ORDER — FERROUS SULFATE 325 (65 FE) MG PO TABS
325.0000 mg | ORAL_TABLET | Freq: Two times a day (BID) | ORAL | Status: DC
  Administered 2021-01-14 – 2021-01-15 (×3): 325 mg via ORAL
  Filled 2021-01-14 (×3): qty 1

## 2021-01-14 MED ORDER — DOCUSATE SODIUM 100 MG PO CAPS
100.0000 mg | ORAL_CAPSULE | Freq: Every day | ORAL | Status: DC
  Administered 2021-01-15: 100 mg via ORAL
  Filled 2021-01-14 (×2): qty 1

## 2021-01-14 MED ORDER — LABETALOL HCL 5 MG/ML IV SOLN: 5.0000 mg | INTRAVENOUS | Status: DC | PRN

## 2021-01-14 MED ORDER — FUROSEMIDE 10 MG/ML IJ SOLN
40.0000 mg | Freq: Once | INTRAMUSCULAR | Status: AC
  Administered 2021-01-14: 40 mg via INTRAVENOUS
  Filled 2021-01-14: qty 4

## 2021-01-14 MED ORDER — SODIUM CHLORIDE 0.9 % IV SOLN
250.0000 mg | Freq: Every day | INTRAVENOUS | Status: AC
  Administered 2021-01-14: 250 mg via INTRAVENOUS
  Filled 2021-01-14: qty 20

## 2021-01-14 NOTE — Progress Notes (Signed)
PROGRESS NOTE  Frances Kent EPP:[id number] DOB: 29-Feb-1930   PCP: [name], MD  Patient is from: Home  DOA: 01/12/2021 LOS: 1  Chief complaints:  Chief Complaint  Patient presents with   Shortness of Breath     Brief Narrative / Interim history: 87 year old F with PMH of recent CVA/CAS s/p revascularization, paroxysmal A. Fib on Xarelto, systolic CHF, severe MVR, SVT, PE, HTN, hypothyroidism and GERD presenting with intermittent shortness of breath for 26-month that has acutely gotten worse for 1 day.  She also had edema, orthopnea and PND.  BNP elevated to 1900.  CXR consistent with CHF.  She had markedly elevated blood pressure.  Admitted for A. fib with RVR and acute on chronic systolic CHF.  Started on IV diuretics and Cardizem drip but converted to normal sinus rhythm.  Back on home p.o. medication for A. fib.  On IV Lasix for CHF.  Cardiology following.   Subjective: Seen and examined earlier this morning.  She reports increased shortness of breath after blood transfusion yesterday.  No other complaints.  She denies chest pain, orthopnea, GI or UTI symptoms.  Objective: Vitals:   01/14/21 0412 01/14/21 0752 01/14/21 0900 01/14/21 1216  BP: (!) 127/42 (!) 140/50  (!) 107/44  Pulse: 63 (!) 57 63 (!) 50  Resp: 20 17  15   Temp: 98.2 F (36.8 C) 98.7 F (37.1 C)  98.7 F (37.1 C)  TempSrc: Oral     SpO2: 95% 94%  96%  Weight: 50.8 kg     Height:        Intake/Output Summary (Last 24 hours) at 01/14/2021 1325 Last data filed at 01/14/2021 1201 Gross per 24 hour  Intake 1853.96 ml  Output 1725 ml  Net 128.96 ml   Filed Weights   01/12/21 1800 01/13/21 0335 01/14/21 0412  Weight: 53.2 kg 53.1 kg 50.8 kg    Examination:  GENERAL: No apparent distress.  Nontoxic. HEENT: MMM.  Vision and hearing grossly intact.  NECK: Supple.  Prominent JVD. RESP: 96% on RA.  No IWOB.  Bibasilar crackles. CVS:  RRR. Heart sounds normal.  ABD/GI/GU: BS+. Abd soft, NTND.   MSK/EXT:  Moves extremities. No apparent deformity.  Trace edema. SKIN: no apparent skin lesion or wound NEURO: Awake and alert. Oriented appropriately.  No apparent focal neuro deficit. PSYCH: Calm. Normal affect.   Procedures:  None  Microbiology summarized: SAYTK-16 and influenza PCR nonreactive.  Assessment & Plan: Paroxysmal A. fib with RVR: Converted to sinus rhythm.  Could be exacerbated by CHF or vice versa. -Continue p.o. amiodarone, Toprol-XL and Xarelto per cardiology  Acute on chronic systolic CHF: TTE 0/1093 with LVEF of 40 to 45%, moderate to severe MVR and moderate TVR.  Presented with dyspnea, edema, orthopnea, PND and A. fib with RVR.  Increased shortness of breath after blood transfusion yesterday.  Anemia could contribute to his symptoms.  Has bibasilar crackles and JVD on exam.  I&O incomplete.   -Back on IV Lasix per cardiology -Monitor fluid status, renal functions and electrolytes. -Manage anemia as below -Ambulate patient.  Symptomatic iron deficiency anemia: Patient denies melena hematochezia. She is on Xarelto but Hemoccult negative.  Anemia panel with iron deficiency.  Transfused 1 unit with appropriate response. Recent Labs    11/02/20 0509 11/03/20 0118 11/04/20 0325 11/05/20 0519 11/12/20 0504 12/07/20 1200 01/12/21 0955 01/12/21 1559 01/13/21 0603 01/14/21 0422  HGB 9.0* 9.2* 9.0* 9.0* 10.3* 9.2 Repeated and verified X2.* 7.7* 8.2* 7.3* 8.3*  -  Monitor H&H -IV ferric gluconate 250 mg on 11/30 and 12/1. -P.o. ferrous sulfate with bowel regimen on discharge  CKD-3A?  Slight creatinine bump after diuretics but improving.Marland Kitchen Recent Labs    11/04/20 0325 11/05/20 0519 11/08/20 1306 11/10/20 0519 11/11/20 0547 11/12/20 0504 12/07/20 1200 01/12/21 0955 01/12/21 1559 01/13/21 0603 01/14/21 0422  BUN 9 9 11 15 14 11  32* 18  --  18 22  CREATININE 0.93 0.90 1.06* 1.04* 1.06* 0.90 1.25* 1.11* 1.18* 1.31* 1.21*  -Continue  monitoring  Hypertensive urgency: Resolved.  Wide pulse pressure -Cardiac meds as above  Hypothyroidism -Continue home Synthroid.  History of CVA/carotid artery stenosis s/p embolization in 10/2020 Hyperlipidemia -Continue home meds.  Generalized weakness/physical deconditioning -PT/OT.   Body mass index is 19.84 kg/m.         DVT prophylaxis:   Rivaroxaban (XARELTO) tablet 15 mg  Code Status: Full code Family Communication: Updated patient's daughter at bedside on 11/30.  None at bedside today. Level of care: Telemetry Cardiac Status is: Observation  The patient will require care spanning > 2 midnights and should be moved to inpatient because: Acute on chronic CHF requiring IV diuretics.       Consultants:  Cardiology   Sch Meds:  Scheduled Meds:  amiodarone  100 mg Oral Daily   calcium carbonate  1 tablet Oral BID WC   docusate sodium  100 mg Oral Daily   ferrous sulfate  325 mg Oral BID WC   levothyroxine  50 mcg Oral Q0600   metoprolol succinate  12.5 mg Oral q morning   multivitamin with minerals  1 tablet Oral Daily   Rivaroxaban  15 mg Oral Q supper   Continuous Infusions:  ferric gluconate (FERRLECIT) IVPB 250 mg (01/14/21 1200)   PRN Meds:.acetaminophen **OR** acetaminophen, ALPRAZolam, ipratropium, labetalol, magnesium hydroxide, ondansetron **OR** ondansetron (ZOFRAN) IV, polyethylene glycol, traZODone  Antimicrobials: Anti-infectives (From admission, onward)    None        I have personally reviewed the following labs and images: CBC: Recent Labs  Lab 01/12/21 0955 01/12/21 1559 01/13/21 0603 01/14/21 0422  WBC 7.1 6.1 8.0 8.6  HGB 7.7* 8.2* 7.3* 8.3*  HCT 25.9* 27.4* 23.9* 26.2*  MCV 82.7 82.5 79.1* 78.9*  PLT 509* 457* 503* 480*   BMP &GFR Recent Labs  Lab 01/12/21 0955 01/12/21 1559 01/13/21 0603 01/14/21 0422  NA 137  --  134* 135  K 4.5  --  4.0 3.6  CL 111  --  107 108  CO2 20*  --  24 22  GLUCOSE 98  --  93 92   BUN 18  --  18 22  CREATININE 1.11* 1.18* 1.31* 1.21*  CALCIUM 8.7*  --  8.4* 8.2*  MG  --   --  2.2 2.4  PHOS  --   --   --  3.1   Estimated Creatinine Clearance: 25.8 mL/min (A) (by C-G formula based on SCr of 1.21 mg/dL (H)). Liver & Pancreas: Recent Labs  Lab 01/12/21 1343 01/14/21 0422  AST 52*  --   ALT 56*  --   ALKPHOS 96  --   BILITOT 1.0  --   PROT 6.7  --   ALBUMIN 3.3* 3.1*   No results for input(s): LIPASE, AMYLASE in the last 168 hours. No results for input(s): AMMONIA in the last 168 hours. Diabetic: No results for input(s): HGBA1C in the last 72 hours. No results for input(s): GLUCAP in the last 168 hours. Cardiac Enzymes:  No results for input(s): CKTOTAL, CKMB, CKMBINDEX, TROPONINI in the last 168 hours. No results for input(s): PROBNP in the last 8760 hours. Coagulation Profile: No results for input(s): INR, PROTIME in the last 168 hours. Thyroid Function Tests: Recent Labs    01/14/21 0422  TSH 4.583*   Lipid Profile: No results for input(s): CHOL, HDL, LDLCALC, TRIG, CHOLHDL, LDLDIRECT in the last 72 hours. Anemia Panel: Recent Labs    01/12/21 1559  VITAMINB12 394  FOLATE 30.0  FERRITIN 31  TIBC 486*  IRON 24*  RETICCTPCT 2.1   Urine analysis:    Component Value Date/Time   COLORURINE YELLOW (A) 09/08/2020 0105   APPEARANCEUR HAZY (A) 09/08/2020 0105   APPEARANCEUR Clear 02/05/2013 1630   LABSPEC 1.024 09/08/2020 0105   LABSPEC 1.009 02/05/2013 1630   PHURINE 6.0 09/08/2020 0105   GLUCOSEU NEGATIVE 09/08/2020 0105   GLUCOSEU NEGATIVE 04/04/2018 1149   HGBUR NEGATIVE 09/08/2020 0105   BILIRUBINUR NEGATIVE 09/08/2020 0105   BILIRUBINUR 1 08/31/2017 1157   BILIRUBINUR Negative 02/05/2013 1630   KETONESUR 5 (A) 09/08/2020 0105   PROTEINUR 100 (A) 09/08/2020 0105   UROBILINOGEN 1.0 04/04/2018 1149   NITRITE NEGATIVE 09/08/2020 0105   LEUKOCYTESUR NEGATIVE 09/08/2020 0105   LEUKOCYTESUR 1+ 02/05/2013 1630   Sepsis Labs: Invalid  input(s): PROCALCITONIN, [place]  Microbiology: Recent Results (from the past 240 hour(s))  Resp Panel by RT-PCR (Flu A&B, Covid) Nasopharyngeal Swab     Status: None   Collection Time: 01/12/21  3:59 PM   Specimen: Nasopharyngeal Swab; Nasopharyngeal(NP) swabs in vial transport medium  Result Value Ref Range Status   SARS Coronavirus 2 by RT PCR NEGATIVE NEGATIVE Final    Comment: (NOTE) SARS-CoV-2 target nucleic acids are NOT DETECTED.  The SARS-CoV-2 RNA is generally detectable in upper respiratory specimens during the acute phase of infection. The lowest concentration of SARS-CoV-2 viral copies this assay can detect is 138 copies/mL. A negative result does not preclude SARS-Cov-2 infection and should not be used as the sole basis for treatment or other patient management decisions. A negative result may occur with  improper specimen collection/handling, submission of specimen other than nasopharyngeal swab, presence of viral mutation(s) within the areas targeted by this assay, and inadequate number of viral copies(<138 copies/mL). A negative result must be combined with clinical observations, patient history, and epidemiological information. The expected result is Negative.  Fact Sheet for Patients:  EntrepreneurPulse.com.au  Fact Sheet for Healthcare Providers:  IncredibleEmployment.be  This test is no t yet approved or cleared by the [place] FDA and  has been authorized for detection and/or diagnosis of SARS-CoV-2 by FDA under an Emergency Use Authorization (EUA). This EUA will remain  in effect (meaning this test can be used) for the duration of the COVID-19 declaration under Section 564(b)(1) of the Act, 21 U.S.C.section 360bbb-3(b)(1), unless the authorization is terminated  or revoked sooner.       Influenza A by PCR NEGATIVE NEGATIVE Final   Influenza B by PCR NEGATIVE NEGATIVE Final    Comment: (NOTE) The Xpert  Xpress SARS-CoV-2/FLU/RSV plus assay is intended as an aid in the diagnosis of influenza from Nasopharyngeal swab specimens and should not be used as a sole basis for treatment. Nasal washings and aspirates are unacceptable for Xpert Xpress SARS-CoV-2/FLU/RSV testing.  Fact Sheet for Patients: EntrepreneurPulse.com.au  Fact Sheet for Healthcare Providers: IncredibleEmployment.be  This test is not yet approved or cleared by the [place] FDA and has been authorized for detection and/or diagnosis  of SARS-CoV-2 by FDA under an Emergency Use Authorization (EUA). This EUA will remain in effect (meaning this test can be used) for the duration of the COVID-19 declaration under Section 564(b)(1) of the Act, 21 U.S.C. section 360bbb-3(b)(1), unless the authorization is terminated or revoked.  Performed at [facility], [address], [place], [place] [address]     Radiology Studies: No results found.    Keiandre Cygan T. Rincon  If 7PM-7AM, please contact night-coverage [email or web address] 01/14/2021, 1:25 PM

## 2021-01-14 NOTE — Progress Notes (Signed)
Physical Therapy Treatment Patient Details Name: Frances Kent MRN: [id number] DOB: September 28, 1930 Today's Date: 01/14/2021   History of Present Illness Pt is an 87 y.o. Caucasian female with PMH significant for recent CVA/CAS s/p revascularization, paroxysmal atrial fibrillation, GERD, HTN, hypothyroidism, CHF, mitral regurgitation, macular degenration, PE and SVT, who presented to the ER with acute onset of worsening dyspnea. She admits to bilateral lower extremity edema as well as orthopnea and paroxysmal nocturnal dyspnea with dyspnea on exertion. MD assessment includes: aproxysmal atrial fibrillation with RVR with associated SVT, acute on chronic CHF, and acute on chronic anemia.   PT Comments    Pt was pleasant and motivated to participate during the session and put forth good effort throughout. Pt reported no pain but did state she felt fatigued. Pt was able to complete all ther ex in bed, EOB, and standing. Pt explained exercises that previous HHPT had given her and demonstrated them. Pt completed bed mobility with ease and supervision. Pt required min guard for safety with STS and ambulation. Pt demonstrated good recall and explanation of backward step sequence. Pt was able to ambulate further than yesterday's session, but was limited by fatigue. PT had conversation with pt about benefits of HHPT and pt was agreeable to having HHPT come back due to recent decline in function. Pt will benefit from HHPT upon discharge to safely address deficits listed in patient problem list for decreased caregiver assistance and eventual return to PLOF.   Recommendations for follow up therapy are one component of a multi-disciplinary discharge planning process, led by the attending physician.  Recommendations may be updated based on patient status, additional functional criteria and insurance authorization.  Follow Up Recommendations  Home health PT     Assistance Recommended at Discharge Frequent or constant  Supervision/Assistance  Equipment Recommendations  None recommended by PT    Recommendations for Other Services       Precautions / Restrictions Precautions Precautions: Fall Restrictions Weight Bearing Restrictions: No     Mobility  Bed Mobility Overal bed mobility: Needs Assistance Bed Mobility: Supine to Sit     Supine to sit: Supervision;HOB elevated     General bed mobility comments: Supervision for safety, did not require any physical assistance    Transfers Overall transfer level: Needs assistance Equipment used: Rolling walker (2 wheels) Transfers: Sit to/from Stand Sit to Stand: Min guard           General transfer comment: Increased time and effort, min guard for safety    Ambulation/Gait Ambulation/Gait assistance: Min guard Gait Distance (Feet): 15 Feet x1, 3 Feet x6 Assistive device: Rolling walker (2 wheels) Gait Pattern/deviations: Step-through pattern;Decreased step length - right;Decreased step length - left;Decreased stride length Gait velocity: decreased     General Gait Details: Increased time and effort, min guard for safety, min cuing for RW proximity during backward steps, one rest break after 3 sets of forward/backward step sequence   Stairs             Wheelchair Mobility    Modified Rankin (Stroke Patients Only)       Balance Overall balance assessment: Needs assistance Sitting-balance support: No upper extremity supported;Feet supported Sitting balance-Leahy Scale: Good     Standing balance support: Bilateral upper extremity supported;During functional activity Standing balance-Leahy Scale: Good                              Cognition Arousal/Alertness: Awake/alert Behavior During  Therapy: WFL for tasks assessed/performed Overall Cognitive Status: Within Functional Limits for tasks assessed                                          Exercises Total Joint Exercises Ankle  Circles/Pumps: AROM;Strengthening;Both;10 reps;Supine Quad Sets: AROM;Strengthening;Both;10 reps;Supine Gluteal Sets: AROM;Strengthening;Both;Seated Long Arc Quad: AROM;Strengthening;Both;20 reps;Seated;Other (comment) (second set with resistance) Marching in Standing: AROM;Strengthening;Both;10 reps;Standing Bridges: AROM;Both;Seated (to get purwick back in) Other Exercises Other Exercises: Review of HEP 10-20x every hour Other Exercises: Reviewed HEP from previous HHPT Other Exercises: Backward step sequence training to prevent falls    General Comments General comments (skin integrity, edema, etc.): HR 50-60BPM throughout session      Pertinent Vitals/Pain Pain Assessment: No/denies pain    Home Living                          Prior Function            PT Goals (current goals can now be found in the care plan section) Acute Rehab PT Goals Patient Stated Goal: to get strength back and go home PT Goal Formulation: With patient Time For Goal Achievement: 01/26/21 Potential to Achieve Goals: Good Progress towards PT goals: Progressing toward goals    Frequency    Min 2X/week      PT Plan Current plan remains appropriate    Co-evaluation              AM-PAC PT "6 Clicks" Mobility   Outcome Measure  Help needed turning from your back to your side while in a flat bed without using bedrails?: None Help needed moving from lying on your back to sitting on the side of a flat bed without using bedrails?: None Help needed moving to and from a bed to a chair (including a wheelchair)?: A Little Help needed standing up from a chair using your arms (e.g., wheelchair or bedside chair)?: A Little Help needed to walk in hospital room?: A Little Help needed climbing 3-5 steps with a railing? : A Little 6 Click Score: 20    End of Session Equipment Utilized During Treatment: Gait belt Activity Tolerance: Patient limited by fatigue Patient left: in chair;with  call bell/phone within reach;with chair alarm set Nurse Communication: Mobility status PT Visit Diagnosis: Difficulty in walking, not elsewhere classified (R26.2);Muscle weakness (generalized) (M62.81)     Time: 1330-1402 PT Time Calculation (min) (ACUTE ONLY): 32 min  Charges:                        Sheldon Silvan SPT 01/14/21, 4:07 PM

## 2021-01-14 NOTE — TOC Initial Note (Signed)
Transition of Care [facility]) - Initial/Assessment Note    Patient Details  Name: Frances Kent MRN: [id number] Date of Birth: 05-Sep-1930  [facility]) CM/SW Contact:    Alberteen Sam, LCSW Phone Number: 01/14/2021, 10:37 AM  Clinical Narrative:                  CSW spoke with patient's daughter [name] regarding PT recommendations of home health. She reports patient has a hx of home health services and recently discharge from [facility], therefore is aware of exercises to do at home. Reports she does not feel they need to have that again at discharge. Does reports patient has wheel chair, walker, 3in1 and belts at home. No DME needs identified.   CSW encouraged daughter [name] to reach out to patient's PCP should she discharge and change her mind about being agreeable to home health services. She expressed understanding.    Expected Discharge Plan: Home/Self Care Barriers to Discharge: Continued Medical Work up   Patient Goals and CMS Choice Patient states their goals for this hospitalization and ongoing recovery are:: to go home CMS Medicare.gov Compare Post Acute Care list provided to:: Patient Represenative (must comment) (daughter [name]) Choice offered to / list presented to : Adult Children  Expected Discharge Plan and Services Expected Discharge Plan: Home/Self Care       Living arrangements for the past 2 months: Single Family Home                                      Prior Living Arrangements/Services Living arrangements for the past 2 months: Single Family Home Lives with:: Adult Children                   Activities of Daily Living Home Assistive Devices/Equipment: Dentures (specify type) ADL Screening (condition at time of admission) Patient's cognitive ability adequate to safely complete daily activities?: Yes Is the patient deaf or have difficulty hearing?: No Does the patient have difficulty seeing, even when wearing glasses/contacts?:  No Does the patient have difficulty concentrating, remembering, or making decisions?: No Patient able to express need for assistance with ADLs?: Yes Does the patient have difficulty dressing or bathing?: No Independently performs ADLs?: No Communication: Needs assistance Is this a change from baseline?: Pre-admission baseline Dressing (OT): Independent Grooming: Needs assistance Is this a change from baseline?: Pre-admission baseline Feeding: Independent Bathing: Needs assistance Is this a change from baseline?: Pre-admission baseline Toileting: Needs assistance Is this a change from baseline?: Pre-admission baseline In/Out Bed: Needs assistance Is this a change from baseline?: Pre-admission baseline Walks in Home: Needs assistance Is this a change from baseline?: Pre-admission baseline Does the patient have difficulty walking or climbing stairs?: Yes Weakness of Legs: None Weakness of Arms/Hands: None  Permission Sought/Granted                  Emotional Assessment Appearance:: Appears stated age       Alcohol / Substance Use: Not Applicable Psych Involvement: No (comment)  Admission diagnosis:  Atrial fibrillation with rapid ventricular response ([place]) [I48.91] Atrial fibrillation with RVR ([place]) [I48.91] Acute on chronic congestive heart failure, unspecified heart failure type ([place]) [I50.9] Acute on chronic anemia [D64.9] Symptomatic anemia [D64.9] Patient Active Problem List   Diagnosis Date Noted   Symptomatic anemia 01/13/2021   History of embolic stroke 00/37/0488   Inflamed external hemorrhoid 12/08/2020   Insomnia  due to anxiety and fear 12/08/2020   Long term (current) use of anticoagulants    Chronic HFrEF (heart failure with reduced ejection fraction) (HCC)    Left basal ganglia embolic stroke ([place]) 26/37/8588   Malnutrition of moderate degree ([place]) 11/03/2020   Prolonged QT interval    Acute on chronic systolic (congestive) heart failure (HCC)     Persistent atrial fibrillation ([place]) 10/30/2020   Antibiotic-associated diarrhea 10/15/2020   ILD (interstitial lung disease) ([place]) 09/22/2020   Atrial flutter, paroxysmal ([place]) 09/10/2020   Tachyarrhythmia 09/08/2020   Sick sinus syndrome ([place]) 09/08/2020   Elevated serum free T4 level 09/08/2020   Purpura senilis ([name]) 08/28/2020   Hiatal hernia 08/09/2020   Atrial fibrillation with rapid ventricular response ([place]) 08/06/2020   Thrombophilia ([place]) 08/06/2020   Elevated troponin I level 05/21/2020   CVA (cerebral vascular accident) ([place]) 03/24/2020   History of COVID-19 03/03/2020   Bilateral carotid artery stenosis 12/23/2019   Schatzki's ring of distal esophagus 12/11/2019   Erosive gastritis 11/30/2019   Dysphagia 11/27/2019   Painful swallowing 11/27/2019   Gastroesophageal reflux disease 11/27/2019   Stage 3a chronic kidney disease ([place]) 04/11/2019   Coagulopathy ([place]) 09/12/2018   Essential hypertension 09/12/2018   CHF (congestive heart failure) ([place]) 08/15/2018   Hypothyroidism due to acquired atrophy of thyroid 05/08/2018   Unintentional weight loss 05/08/2018   Myalgia due to statin 05/08/2018   Abdominal aortic atherosclerosis ([place]) 05/07/2018   Bradycardia 04/28/2018   Pleural effusion on left 02/27/2018   Moderate mitral stenosis 01/10/2018   Lumbar radiculitis 01/07/2018   B12 deficiency 07/26/2017   Atrial fibrillation status post cardioversion [facility]) 04/06/2016   Hospital discharge follow-up 03/10/2016   Vitamin D deficiency 04/08/2015   Cervical spine degeneration 12/13/2014   History of pulmonary embolism 12/02/2014   Insomnia 10/04/2014   Moderate tricuspid insufficiency 09/17/2014   Generalized anxiety disorder 03/28/2013   Mitral valve prolapse 03/28/2013   Routine adult health maintenance 03/23/2012   Fatigue 03/23/2012   Cough 04/17/2011   Screening for breast cancer 11/29/2010   Macular degeneration, left eye 11/29/2010   Screening for colon  cancer 11/29/2010   Hyperlipidemia LDL goal <70 11/29/2010   GERD (gastroesophageal reflux disease)    PCP:  [name], MD Pharmacy:   [facility] [address] [place] [address] Phone: [phone] Fax: [phone]  [facility] [place], [place] [address] Phone: [phone] Fax: [phone]  [id number] [place], [place] - [address] [place] [address] Phone: [phone] Fax: [phone]  [facility] [address] [place] [address] Phone: [phone] Fax: [phone]     Social Determinants of Health (SDOH) Interventions    Readmission Risk Interventions Readmission Risk Prevention Plan 11/01/2020  Transportation Screening Complete  PCP or Specialist Appt within 5-7 Days Complete  Medication Review (RN CM) Complete  Some recent data might be hidden

## 2021-01-14 NOTE — Consult Note (Signed)
[facility] Cardiology  CARDIOLOGY CONSULT NOTE  Patient ID: Frances Kent MRN: [id number] DOB/AGE: 07/09/1930 87 y.o.  Admit date: 01/12/2021 Referring Physician Eugenie Norrie Primary Physician [name], MD Primary Cardiologist [name] Reason for Consultation AF with RVR  HPI:  [name] is an 87 year old female with a history of paroxysmal atrial fibrillation, HFrEF (EF 40 to 45%), severe MR, hypertension, PE who was admitted with worsening shortness of breath since last night.  Cardiology is consulted for assistance in management of atrial fibrillation with RVR. She returned to NSR shortly after admission.   Interval history: - No Acute events - Feels more short of breath after getting blood yesterday. - Still in NSR.   Review of systems complete and found to be negative unless listed above     Past Medical History:  Diagnosis Date   Atrial fibrillation ([place])    Benign breast cyst in female, left 10/07/2016   Colon adenomas    GERD (gastroesophageal reflux disease)    Hypertension    Hypothyroidism    Macular degeneration    Mitral regurgitation    Pulmonary embolism ([place]) 02/2016   SVT (supraventricular tachycardia) ([place])     Past Surgical History:  Procedure Laterality Date   APPENDECTOMY  1960   BREAST CYST ASPIRATION Left 2017   CARDIOVERSION N/A 04/05/2016   Procedure: Cardioversion;  Surgeon: [name], MD;  Location: [facility] ORS;  Service: Cardiovascular;  Laterality: N/A;   CHOLECYSTECTOMY  1985   ESOPHAGOGASTRODUODENOSCOPY (EGD) WITH PROPOFOL N/A 11/27/2019   Procedure: ESOPHAGOGASTRODUODENOSCOPY (EGD) WITH PROPOFOL;  Surgeon: Lesly Rubenstein, MD;  Location: [facility] END[facility];  Service: Endoscopy;  Laterality: N/A;   ESOPHAGOGASTRODUODENOSCOPY (EGD) WITH PROPOFOL N/A 12/24/2019   Procedure: ESOPHAGOGASTRODUODENOSCOPY (EGD) WITH PROPOFOL;  Surgeon: Lesly Rubenstein, MD;  Location: [facility] ENDOSCOPY;  Service: Endoscopy;  Laterality: N/A;   IR CT HEAD  LTD  11/01/2020   IR PERCUTANEOUS ART THROMBECTOMY/INFUSION INTRACRANIAL INC DIAG ANGIO  11/01/2020   IR US GUIDE VASC ACCESS RIGHT  11/02/2020   OVARIAN CYST REMOVAL     RADIOLOGY WITH ANESTHESIA N/A 11/01/2020   Procedure: RADIOLOGY WITH ANESTHESIA;  Surgeon: Luanne Bras, MD;  Location: [place];  Service: Radiology;  Laterality: N/A;   TEE WITHOUT CARDIOVERSION N/A 02/01/2018   Procedure: TRANSESOPHAGEAL ECHOCARDIOGRAM (TEE);  Surgeon: [name], MD;  Location: [facility] ORS;  Service: Cardiovascular;  Laterality: N/A;    Medications Prior to Admission  Medication Sig Dispense Refill Last Dose   acetaminophen (TYLENOL) 500 MG tablet Take 500 mg by mouth every 6 (six) hours as needed.   Past Month   ALPRAZolam (XANAX) 0.25 MG tablet Take 1 tablet (0.25 mg total) by mouth at bedtime as needed for anxiety. 20 tablet 0 01/11/2021   amiodarone (PACERONE) 200 MG tablet Take 1/2 tablet (100 mg total) by mouth daily. 15 tablet 0 01/11/2021   calcium carbonate (TUMS - DOSED IN MG ELEMENTAL CALCIUM) 500 MG chewable tablet Chew 1 tablet (200 mg of elemental calcium total) by mouth 2 (two) times daily with a meal.   Past Week   furosemide (LASIX) 20 MG tablet Take 1 tablet by mouth daily.   Past Week   ipratropium (ATROVENT) 0.02 % nebulizer solution Take 2.5 mLs (0.5 mg total) by nebulization every 6 (six) hours as needed for wheezing or shortness of breath (cough). 75 mL 12 Past Week   levothyroxine (EUTHYROX) 50 MCG tablet Take 1 tablet (50 mcg total) by mouth daily at 6 (six) AM. 30  tablet 0 01/11/2021   metoprolol succinate (TOPROL-XL) 25 MG 24 hr tablet Take 1/2 tablet (12.5 mg total) by mouth every morning. 15 tablet 0 01/11/2021   Multiple Vitamin (MULTIVITAMIN WITH MINERALS) TABS tablet Take 1 tablet by mouth daily.   01/11/2021   polyethylene glycol (MIRALAX / GLYCOLAX) 17 g packet Take 17 g by mouth daily as needed for moderate constipation. 14 each 0 Past Month   Rivaroxaban (XARELTO) 15  MG TABS tablet Take 1 tablet (15 mg total) by mouth daily with supper. 30 tablet 0 01/11/2021   ezetimibe (ZETIA) 10 MG tablet Take 1 tablet by mouth daily. (Patient not taking: Reported on 01/12/2021)   Not Taking   famotidine (PEPCID) 20 MG tablet Take 1 tablet (20 mg total) by mouth daily. (Patient not taking: Reported on 01/12/2021) 30 tablet 0 Not Taking   megestrol (MEGACE) 400 MG/10ML suspension Take 10 mLs (400 mg total) by mouth 2 (two) times daily. (Patient not taking: Reported on 01/12/2021) 600 mL 1 Not Taking   melatonin 3 MG TABS tablet Take 1 tablet (3 mg total) by mouth at bedtime as needed. 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  Substance and Sexual Activity   Alcohol use: No   Drug use: No   Sexual activity: Not Currently  Other Topics Concern   Not on file  Social History Narrative   Has caretaking responsibility for 3 young grandchildren who live with her   Social Determinants of Health   Financial Resource Strain: Not on file  Food Insecurity: Not on file  Transportation Needs: Not on file  Physical Activity: Not on file  Stress: Not on file  Social Connections: Not on file  Intimate Partner Violence: Not on file    Family History  Problem Relation Age of Onset   Cancer Mother 76       breast cancer, lived to 77,    Breast cancer Mother 87   Cancer Son 81       pancreatic cancer   Heart disease Son        CAD, Tobacco Abuse    Heart disease Maternal Grandmother 80       died of massive MI   Stroke Sister        Review of systems complete and found to be negative unless listed above      PHYSICAL EXAM  General: Well developed, well nourished, in no acute distress HEENT:  Normocephalic and atramatic Neck:  No JVD.  Lungs: Crackles in BL bases.  Heart: HRRR . Normal S1 and S2 without gallops or murmurs.  Abdomen: Bowel sounds are positive, abdomen soft and non-tender  Msk:  Back normal, normal gait. Normal strength and tone for age. Extremities: No clubbing, cyanosis. Trace to 1+ edema to lower shin.  Neuro: Alert and oriented X 3. Psych:  Good affect, responds appropriately  Labs:   Lab Results  Component Value Date   WBC 8.6  01/14/2021   HGB 8.3 (L) 01/14/2021   HCT 26.2 (L) 01/14/2021   MCV 78.9 (L) 01/14/2021   PLT 480 (H) 01/14/2021    Recent Labs  Lab 01/12/21 1343 01/12/21 1559 01/14/21 0422  NA  --    < > 135  K  --    < > 3.6  CL  --    < > 108  CO2  --    < > 22  BUN  --    < > 22  CREATININE  --    < > 1.21*  CALCIUM  --    < > 8.2*  PROT 6.7  --   --   BILITOT 1.0  --   --   ALKPHOS 96  --   --   ALT 56*  --   --   AST 52*  --   --   GLUCOSE  --    < > 92   < > = values in this interval not displayed.    Lab Results  Component Value Date   CKTOTAL 83 05/09/2018   TROPONINI <0.03 02/25/2018     Lab Results  Component Value Date   CHOL 144 11/02/2020   CHOL 284 (H) 12/11/2019   CHOL 208 (H) 08/31/2018   Lab Results  Component Value Date   HDL 48 11/02/2020   HDL 100.10 12/11/2019   HDL 72.40 08/31/2018   Lab Results  Component Value Date   LDLCALC 81 11/02/2020   LDLCALC 166 (H) 12/11/2019   LDLCALC 116 (H) 08/31/2018   Lab Results  Component Value Date   TRIG 76 11/02/2020   TRIG 72 11/02/2020   TRIG 91.0 12/11/2019   Lab Results  Component Value Date   CHOLHDL 3.0 11/02/2020   CHOLHDL 3 12/11/2019   CHOLHDL 3 08/31/2018   Lab Results  Component Value Date   LDLDIRECT 185.3 03/27/2013   LDLDIRECT 156.1 03/23/2012    LDLDIRECT 181.4 01/17/2011      Radiology: DG Chest 2 View  Result Date: 01/12/2021 CLINICAL DATA:  Chest pain EXAM: CHEST - 2 VIEW COMPARISON:  Chest x-ray dated November 07, 2020 FINDINGS: Unchanged cardiac and mediastinal contours. Mild bilateral interstitial opacities. Trace bilateral pleural effusions. No evidence of pneumothorax. IMPRESSION: 1. Mild bilateral interstitial opacities, likely due to pulmonary edema. 2. Trace bilateral pleural effusions. Electronically Signed   By: Yetta Glassman M.D.   On: 01/12/2021 10:34    EKG: AF vs flutter, RBBB, LAFB (bifascicular block) at rate of 140 bpm  Echo 10/2020-  1. Left ventricular ejection fraction, by estimation, is 40 to 45%. The  left ventricle has mildly decreased function. The left ventricle has no  regional wall motion abnormalities. Left ventricular diastolic parameters  are indeterminate.   2. Right ventricular systolic function is normal. The right ventricular  size is normal.   3. Left atrial size was moderately dilated.   4. Right atrial size was moderately dilated.   5. The mitral valve is normal in structure. Moderate to severe mitral  valve regurgitation. No evidence of mitral stenosis.   6. Tricuspid valve regurgitation is moderate.   7. The aortic valve is normal in structure. Aortic valve regurgitation is  trivial. No aortic stenosis is present.   8. The inferior vena cava is normal in size with greater than 50%  respiratory variability, suggesting right atrial pressure of 3 mmHg.   ASSESSMENT AND PLAN:  Frances Kent is an 87 year old female with a history of  paroxysmal atrial fibrillation, HFrEF (EF 40 to 45%), severe MR, hypertension, PE who was admitted with worsening shortness of breath since last night.  Cardiology is consulted for assistance in management of atrial fibrillation with RVR.  # AF with RVR #Paroxysmal atrial fibrillation #Heart failure with reduced ejection fraction #Severe MR Patient has  multiple presentations with atrial fibrillation with RVR, most recently in September 2022 at which time she required electrical cardioversion back to normal sinus rhythm.  She now presents with 1 to 2 days of worsening shortness of breath and lower extremity edema and was discovered to be back in atrial fibrillation with RVR. She is back in NSR this morning. -Continue anticoagulation with Xarelto--> If acute blood loss anemia is suspected this can be held as needed.  -Continue amiodarone 200 mg daily -Diltiazem- stop infusion - Resume home metoprolol XL - Lasix 40 mg IV this morning; SOB following blood transfusion  # Acute on chronic anemia Recent baseline hbg 9-10, decreased to 7.3 this morning.  Iron studies appear to be consistent with iron deficiency anemia.  She denies any obvious signs or symptoms of ongoing blood loss. -Xarelto for anticoagulation for A. fib as above.  If needed this can be held.  Signed: Andrez Grime MD 01/14/2021, 7:55 AM

## 2021-01-14 NOTE — Progress Notes (Addendum)
Noted redness at IV site on left wrist.  Patient complains of pain when flushing.    Removed IV, will continue to monitor (IV documentation in chart had 2 right wrist peripheral IVs. Removed one from chart).

## 2021-01-15 DIAGNOSIS — N1831 Chronic kidney disease, stage 3a: Secondary | ICD-10-CM

## 2021-01-15 LAB — RENAL FUNCTION PANEL
Albumin: 3.2 g/dL — ABNORMAL LOW (ref 3.5–5.0)
Anion gap: 5 (ref 5–15)
BUN: 18 mg/dL (ref 8–23)
CO2: 26 mmol/L (ref 22–32)
Calcium: 8.9 mg/dL (ref 8.9–10.3)
Chloride: 106 mmol/L (ref 98–111)
Creatinine, Ser: 1.18 mg/dL — ABNORMAL HIGH (ref 0.44–1.00)
GFR, Estimated: 44 mL/min — ABNORMAL LOW (ref >60)
Glucose, Bld: 91 mg/dL (ref 70–99)
Phosphorus: 3.2 mg/dL (ref 2.5–4.6)
Potassium: 4 mmol/L (ref 3.5–5.1)
Sodium: 137 mmol/L (ref 135–145)

## 2021-01-15 LAB — HEMOGLOBIN AND HEMATOCRIT, BLOOD
HCT: 27.4 % — ABNORMAL LOW (ref 36.0–46.0)
Hemoglobin: 8.4 g/dL — ABNORMAL LOW (ref 12.0–15.0)

## 2021-01-15 LAB — MAGNESIUM: Magnesium: 2.5 mg/dL — ABNORMAL HIGH (ref 1.7–2.4)

## 2021-01-15 LAB — BRAIN NATRIURETIC PEPTIDE: B Natriuretic Peptide: 502.7 pg/mL — ABNORMAL HIGH (ref 0.0–100.0)

## 2021-01-15 MED ORDER — FUROSEMIDE 20 MG PO TABS: 40.0000 mg | ORAL_TABLET | Freq: Every day | ORAL | 1 refills | Status: DC

## 2021-01-15 MED ORDER — FERROUS SULFATE 325 (65 FE) MG PO TBEC: 325.0000 mg | DELAYED_RELEASE_TABLET | Freq: Two times a day (BID) | ORAL | 1 refills | Status: DC

## 2021-01-15 MED ORDER — FUROSEMIDE 10 MG/ML IJ SOLN
40.0000 mg | Freq: Every day | INTRAMUSCULAR | Status: DC
  Filled 2021-01-15: qty 4

## 2021-01-15 MED ORDER — FUROSEMIDE 40 MG PO TABS
80.0000 mg | ORAL_TABLET | Freq: Once | ORAL | Status: AC
  Administered 2021-01-15: 80 mg via ORAL
  Filled 2021-01-15: qty 2

## 2021-01-15 MED ORDER — SENNOSIDES-DOCUSATE SODIUM 8.6-50 MG PO TABS: 1.0000 | ORAL_TABLET | Freq: Two times a day (BID) | ORAL | 0 refills | Status: DC | PRN

## 2021-01-15 NOTE — Consult Note (Signed)
[facility] Cardiology  CARDIOLOGY CONSULT NOTE  Patient ID: Frances Kent MRN: [id number] DOB/AGE: 07-27-1930 87 y.o.  Admit date: 01/12/2021 Referring Physician Eugenie Norrie Primary Physician [name], MD Primary Cardiologist [name] Reason for Consultation AF with RVR  HPI:  Frances Kent is an 87 year old female with a history of paroxysmal atrial fibrillation, HFrEF (EF 40 to 45%), severe MR, hypertension, PE who was admitted with worsening shortness of breath since last night.  Cardiology is consulted for assistance in management of atrial fibrillation with RVR. She returned to NSR shortly after admission.   Interval history: - Feels better today. No longer having shortness of breath after lasix.  - No chest pain. No recurrence of AF.   Review of systems complete and found to be negative unless listed above     Past Medical History:  Diagnosis Date   Atrial fibrillation ([place])    Benign breast cyst in female, left 10/07/2016   Colon adenomas    GERD (gastroesophageal reflux disease)    Hypertension    Hypothyroidism    Macular degeneration    Mitral regurgitation    Pulmonary embolism ([place]) 02/2016   SVT (supraventricular tachycardia) ([place])     Past Surgical History:  Procedure Laterality Date   APPENDECTOMY  1960   BREAST CYST ASPIRATION Left 2017   CARDIOVERSION N/A 04/05/2016   Procedure: Cardioversion;  Surgeon: [name], MD;  Location: [facility] ORS;  Service: Cardiovascular;  Laterality: N/A;   CHOLECYSTECTOMY  1985   ESOPHAGOGASTRODUODENOSCOPY (EGD) WITH PROPOFOL N/A 11/27/2019   Procedure: ESOPHAGOGASTRODUODENOSCOPY (EGD) WITH PROPOFOL;  Surgeon: Lesly Rubenstein, MD;  Location: [facility] END[facility];  Service: Endoscopy;  Laterality: N/A;   ESOPHAGOGASTRODUODENOSCOPY (EGD) WITH PROPOFOL N/A 12/24/2019   Procedure: ESOPHAGOGASTRODUODENOSCOPY (EGD) WITH PROPOFOL;  Surgeon: Lesly Rubenstein, MD;  Location: [facility];  Service: Endoscopy;  Laterality: N/A;    IR CT HEAD LTD  11/01/2020   IR PERCUTANEOUS ART THROMBECTOMY/INFUSION INTRACRANIAL INC DIAG ANGIO  11/01/2020   IR US GUIDE VASC ACCESS RIGHT  11/02/2020   OVARIAN CYST REMOVAL     RADIOLOGY WITH ANESTHESIA N/A 11/01/2020   Procedure: RADIOLOGY WITH ANESTHESIA;  Surgeon: Luanne Bras, MD;  Location: [place];  Service: Radiology;  Laterality: N/A;   TEE WITHOUT CARDIOVERSION N/A 02/01/2018   Procedure: TRANSESOPHAGEAL ECHOCARDIOGRAM (TEE);  Surgeon: [name], MD;  Location: [facility] ORS;  Service: Cardiovascular;  Laterality: N/A;    Medications Prior to Admission  Medication Sig Dispense Refill Last Dose   acetaminophen (TYLENOL) 500 MG tablet Take 500 mg by mouth every 6 (six) hours as needed.   Past Month   ALPRAZolam (XANAX) 0.25 MG tablet Take 1 tablet (0.25 mg total) by mouth at bedtime as needed for anxiety. 20 tablet 0 01/11/2021   amiodarone (PACERONE) 200 MG tablet Take 1/2 tablet (100 mg total) by mouth daily. 15 tablet 0 01/11/2021   calcium carbonate (TUMS - DOSED IN MG ELEMENTAL CALCIUM) 500 MG chewable tablet Chew 1 tablet (200 mg of elemental calcium total) by mouth 2 (two) times daily with a meal.   Past Week   furosemide (LASIX) 20 MG tablet Take 1 tablet by mouth daily.   Past Week   ipratropium (ATROVENT) 0.02 % nebulizer solution Take 2.5 mLs (0.5 mg total) by nebulization every 6 (six) hours as needed for wheezing or shortness of breath (cough). 75 mL 12 Past Week   levothyroxine (EUTHYROX) 50 MCG tablet Take 1 tablet (50 mcg total) by mouth daily at 6 (  six) AM. 30 tablet 0 01/11/2021   metoprolol succinate (TOPROL-XL) 25 MG 24 hr tablet Take 1/2 tablet (12.5 mg total) by mouth every morning. 15 tablet 0 01/11/2021   Multiple Vitamin (MULTIVITAMIN WITH MINERALS) TABS tablet Take 1 tablet by mouth daily.   01/11/2021   polyethylene glycol (MIRALAX / GLYCOLAX) 17 g packet Take 17 g by mouth daily as needed for moderate constipation. 14 each 0 Past Month   Rivaroxaban  (XARELTO) 15 MG TABS tablet Take 1 tablet (15 mg total) by mouth daily with supper. 30 tablet 0 01/11/2021   ezetimibe (ZETIA) 10 MG tablet Take 1 tablet by mouth daily. (Patient not taking: Reported on 01/12/2021)   Not Taking   famotidine (PEPCID) 20 MG tablet Take 1 tablet (20 mg total) by mouth daily. (Patient not taking: Reported on 01/12/2021) 30 tablet 0 Not Taking   megestrol (MEGACE) 400 MG/10ML suspension Take 10 mLs (400 mg total) by mouth 2 (two) times daily. (Patient not taking: Reported on 01/12/2021) 600 mL 1 Not Taking   melatonin 3 MG TABS tablet Take 1 tablet (3 mg total) by mouth at bedtime as needed. 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  Substance and Sexual Activity   Alcohol use: No   Drug use: No   Sexual activity: Not Currently  Other Topics Concern   Not on file  Social History Narrative   Has caretaking responsibility for 3 young grandchildren who live with her   Social Determinants of Health   Financial Resource Strain: Not on file  Food Insecurity: Not on file  Transportation Needs: Not on file  Physical Activity: Not on file  Stress: Not on file  Social Connections: Not on file  Intimate Partner Violence: Not on file    Family History  Problem Relation Age of Onset   Cancer Mother 11       breast cancer, lived to 3,    Breast cancer Mother 12   Cancer Son 37       pancreatic cancer   Heart disease Son        CAD, Tobacco Abuse    Heart disease Maternal Grandmother 80       died of massive MI    Stroke Sister       Review of systems complete and found to be negative unless listed above      PHYSICAL EXAM  General: Well developed, well nourished, in no acute distress HEENT:  Normocephalic and atramatic Neck:  No JVD.  Lungs: Crackles in BL bases.  Heart: HRRR . Normal S1 and S2 without gallops or murmurs.  Abdomen: Bowel sounds are positive, abdomen soft and non-tender  Msk:  Back normal, normal gait. Normal strength and tone for age. Extremities: No clubbing, cyanosis. Trace to 1+ edema to lower shin.  Neuro: Alert and oriented X 3. Psych:  Good affect, responds appropriately  Labs:   Lab Results  Component Value Date  WBC 8.6 01/14/2021   HGB 8.4 (L) 01/15/2021   HCT 27.4 (L) 01/15/2021   MCV 78.9 (L) 01/14/2021   PLT 480 (H) 01/14/2021    Recent Labs  Lab 01/12/21 1343 01/12/21 1559 01/15/21 0549  NA  --    < > 137  K  --    < > 4.0  CL  --    < > 106  CO2  --    < > 26  BUN  --    < > 18  CREATININE  --    < > 1.18*  CALCIUM  --    < > 8.9  PROT 6.7  --   --   BILITOT 1.0  --   --   ALKPHOS 96  --   --   ALT 56*  --   --   AST 52*  --   --   GLUCOSE  --    < > 91   < > = values in this interval not displayed.    Lab Results  Component Value Date   CKTOTAL 83 05/09/2018   TROPONINI <0.03 02/25/2018     Lab Results  Component Value Date   CHOL 144 11/02/2020   CHOL 284 (H) 12/11/2019   CHOL 208 (H) 08/31/2018   Lab Results  Component Value Date   HDL 48 11/02/2020   HDL 100.10 12/11/2019   HDL 72.40 08/31/2018   Lab Results  Component Value Date   LDLCALC 81 11/02/2020   LDLCALC 166 (H) 12/11/2019   LDLCALC 116 (H) 08/31/2018   Lab Results  Component Value Date   TRIG 76 11/02/2020   TRIG 72 11/02/2020   TRIG 91.0 12/11/2019   Lab Results  Component Value Date   CHOLHDL 3.0 11/02/2020   CHOLHDL 3 12/11/2019   CHOLHDL 3 08/31/2018   Lab Results  Component Value Date   LDLDIRECT 185.3 03/27/2013   LDLDIRECT 156.1  03/23/2012   LDLDIRECT 181.4 01/17/2011      Radiology: DG Chest 2 View  Result Date: 01/12/2021 CLINICAL DATA:  Chest pain EXAM: CHEST - 2 VIEW COMPARISON:  Chest x-ray dated November 07, 2020 FINDINGS: Unchanged cardiac and mediastinal contours. Mild bilateral interstitial opacities. Trace bilateral pleural effusions. No evidence of pneumothorax. IMPRESSION: 1. Mild bilateral interstitial opacities, likely due to pulmonary edema. 2. Trace bilateral pleural effusions. Electronically Signed   By: Yetta Glassman M.D.   On: 01/12/2021 10:34    EKG: AF vs flutter, RBBB, LAFB (bifascicular block) at rate of 140 bpm  Echo 10/2020-  1. Left ventricular ejection fraction, by estimation, is 40 to 45%. The  left ventricle has mildly decreased function. The left ventricle has no  regional wall motion abnormalities. Left ventricular diastolic parameters  are indeterminate.   2. Right ventricular systolic function is normal. The right ventricular  size is normal.   3. Left atrial size was moderately dilated.   4. Right atrial size was moderately dilated.   5. The mitral valve is normal in structure. Moderate to severe mitral  valve regurgitation. No evidence of mitral stenosis.   6. Tricuspid valve regurgitation is moderate.   7. The aortic valve is normal in structure. Aortic valve regurgitation is  trivial. No aortic stenosis is present.   8. The inferior vena cava is normal in size with greater than 50%  respiratory variability, suggesting right atrial pressure of 3 mmHg.   ASSESSMENT AND PLAN:  Frances Kent is an 87 year old female with a  history of paroxysmal atrial fibrillation, HFrEF (EF 40 to 45%), severe MR, hypertension, PE who was admitted with worsening shortness of breath since last night.  Cardiology is consulted for assistance in management of atrial fibrillation with RVR.  # AF with RVR #Paroxysmal atrial fibrillation #Heart failure with reduced ejection fraction #Severe  MR Patient has multiple presentations with atrial fibrillation with RVR, most recently in September 2022 at which time she required electrical cardioversion back to normal sinus rhythm.  She now presents with 1 to 2 days of worsening shortness of breath and lower extremity edema and was discovered to be back in atrial fibrillation with RVR. She is back in NSR this morning. -Continue anticoagulation with Xarelto--> If acute blood loss anemia is suspected this can be held as needed.  -Continue amiodarone 100 mg daily - Resume home metoprolol XL - Lasix 40 mg IV this morning. Recommend lasix 40 mg PO at discharge.  - Follow up with Dr. [name] in 1-2 weeks.   # Acute on chronic anemia Recent baseline hbg 9-10, decreased to 7.3 on admission.  Iron studies appear to be consistent with iron deficiency anemia.  She denies any obvious signs or symptoms of ongoing blood loss. -Xarelto for anticoagulation for A. fib as above.  If needed this can be held but Hgb has been stable since admission.  Signed: Andrez Grime MD 01/15/2021, 8:01 AM

## 2021-01-15 NOTE — Discharge Summary (Signed)
Physician Discharge Summary  Frances Kent EXB:[id number] DOB: Jun 24, 1930 DOA: 01/12/2021  PCP: [name], MD  Admit date: 01/12/2021 Discharge date: 01/15/2021 Admitted From: Home Disposition: Home Recommendations for Outpatient Follow-up:  Follow ups as below. Please obtain CBC/BMP/Mag at follow up Please follow up on the following pending results: None Home Health: PT Equipment/Devices: None Discharge Condition: Stable CODE STATUS: Full code  Follow-up Information     [name], MD. Schedule an appointment as soon as possible for a visit in 1 week(s).   Specialty: Internal Medicine Contact information: [place] [address] [phone]                Hospital Course: 87 year old F with PMH of recent CVA/CAS s/p revascularization, paroxysmal A. Fib on Xarelto, systolic CHF, severe MVR, SVT, PE, HTN, hypothyroidism and GERD presenting with intermittent shortness of breath for 43-month that has acutely gotten worse for 1 day.  She also had edema, orthopnea and PND.  BNP elevated to 1900.  CXR consistent with CHF.  She had markedly elevated blood pressure.  Admitted for A. fib with RVR and acute on chronic systolic CHF.  Started on IV diuretics and Cardizem drip but converted to normal sinus rhythm.  Restarted on home medications for atrial fibrillation.  Diuresed with IV Lasix with improvement in his symptoms and fluid status.  Cleared for discharge by cardiology on p.o. Lasix 40 mg daily.  Patient has upcoming appointment with cardiology next week.  Patient also had iron deficiency anemia.  She denies melena or hematochezia.  Hemoccult was negative.  Transfused 1 unit with appropriate response.  Also received IV ferric gluconate 250 mg x 2.  She is discharged on p.o. ferrous sulfate with bowel regimen.  Home health PT ordered as recommended by therapy.   See individual problem list below for more on hospital course.  Discharge  Diagnoses:  Paroxysmal A. fib with RVR: Converted to sinus rhythm.  Could be exacerbated by CHF or vice versa. -Continue p.o. amiodarone, Toprol-XL and Xarelto per cardiology   Acute on chronic systolic CHF: TTE 05/2593 with LVEF of 40 to 45%, moderate to severe MVR and moderate TVR.  Presented with dyspnea, edema, orthopnea, PND and A. fib with RVR.  Diuresed with IV Lasix with improvement in his symptoms. -Discharge on p.o. Lasix 40 mg daily per cardiology. -Continue Toprol-XL -Counseled on sodium and fluid restriction. -She has upcoming appointment with cardiology next week.   Symptomatic iron deficiency anemia:  Recent Labs    11/03/20 0118 11/04/20 0325 11/05/20 0519 11/12/20 0504 12/07/20 1200 01/12/21 0955 01/12/21 1559 01/13/21 0603 01/14/21 0422 01/15/21 0549  HGB 9.2* 9.0* 9.0* 10.3* 9.2 Repeated and verified X2.* 7.7* 8.2* 7.3* 8.3* 8.4*  -Discharged on p.o. ferrous sulfate 365 mg twice daily -Advised to take stool softener -Recheck CBC at follow-up.   CKD-3A?  Creatinine stable. Recent Labs    11/05/20 0519 11/08/20 1306 11/10/20 0519 11/11/20 0547 11/12/20 0504 12/07/20 1200 01/12/21 0955 01/12/21 1559 01/13/21 0603 01/14/21 0422 01/15/21 0549  BUN 9 11 15 14 11  32* 18  --  18 22 18   CREATININE 0.90 1.06* 1.04* 1.06* 0.90 1.25* 1.11* 1.18* 1.31* 1.21* 1.18*  -Recheck at follow-up.   Hypertensive urgency: Resolved.  Wide pulse pressure -Toprol-XL and Lasix as above   Hypothyroidism -Continue home Synthroid.   History of CVA/carotid artery stenosis s/p embolization in 10/2020 Hyperlipidemia -Continue home meds.   Generalized weakness/physical deconditioning -HH PT ordered  Underweight Body mass index is 19.67 kg/m (pended).           Discharge Exam: Vitals:   01/14/21 2000 01/15/21 0336 01/15/21 0806 01/15/21 0856  BP: (!) 110/52 (!) 148/57 (!) 138/49   Pulse: 66 61 (!) 53 61  Temp: 98.5 F (36.9 C) 98.2 F (36.8 C) 98.5 F (36.9 C)    Resp: 18 20 18    Height:      Weight:  (P) 50.4 kg    SpO2: 96% 97% 97%   TempSrc: Oral     BMI (Calculated):  (P) 19.68       GENERAL: Frail looking elderly female.  No apparent distress. HEENT: MMM.  Vision and hearing grossly intact.  NECK: Supple.  Some JVD noted. RESP: 97% on RA.  No IWOB.  Fair aeration bilaterally. CVS:  RRR. Heart sounds normal.  ABD/GI/GU: Bowel sounds present. Soft. Non tender.  MSK/EXT:  Moves extremities. No apparent deformity. No edema.  SKIN: no apparent skin lesion or wound NEURO: Awake and alert.  Oriented appropriately.  No apparent focal neuro deficit. PSYCH: Calm. Normal affect.   Discharge Instructions  Discharge Instructions     (HEART FAILURE PATIENTS) Call MD:  Anytime you have any of the following symptoms: 1) 3 pound weight gain in 24 hours or 5 pounds in 1 week 2) shortness of breath, with or without a dry hacking cough 3) swelling in the hands, feet or stomach 4) if you have to sleep on extra pillows at night in order to breathe.   Complete by: As directed    Call MD for:  difficulty breathing, headache or visual disturbances   Complete by: As directed    Call MD for:  extreme fatigue   Complete by: As directed    Call MD for:  persistant dizziness or light-headedness   Complete by: As directed    Diet - low sodium heart healthy   Complete by: As directed    Discharge instructions   Complete by: As directed    It has been a pleasure taking care of you!  You were hospitalized with heart failure exacerbation, atrial fibrillation (rapid and irregular heartbeat) and anemia.  You have been treated with intravenous Lasix for heart failure exacerbation.  Your heart rate has been controlled after medication.  Received blood transfusion and intravenous iron for you anemia which is likely due to iron deficiency.  We have increased your Lasix to 40 mg as recommended by cardiologist.  We have also started you on iron tablets for iron deficiency.   Iron include cause constipation and turn your stool dark.  We recommend using a stool softener for constipation.  Please review your new medication list and the directions on your medications before you take them.  Follow-up with your primary care doctor and cardiologist in 1 to 2 weeks.  In addition to taking your medications as prescribed, we also recommend you avoid alcohol or over-the-counter pain medication other than plain Tylenol, limit the amount of water/fluid you drink to less than 6 cups (1500 cc) a day,  limit your sodium (salt) intake to less than 2 g (2000 mg) a day and weigh yourself daily at the same time and keeping your weight log.     Take care,   Increase activity slowly   Complete by: As directed    No wound care   Complete by: As directed       Allergies as of 01/15/2021  Reactions   Statins    Severe myalgias        Medication List     TAKE these medications    acetaminophen 500 MG tablet Commonly known as: TYLENOL Take 500 mg by mouth every 6 (six) hours as needed.   ALPRAZolam 0.25 MG tablet Commonly known as: XANAX Take 1 tablet (0.25 mg total) by mouth at bedtime as needed for anxiety.   amiodarone 200 MG tablet Commonly known as: PACERONE Take 1/2 tablet (100 mg total) by mouth daily.   calcium carbonate 500 MG chewable tablet Commonly known as: TUMS - dosed in mg elemental calcium Chew 1 tablet (200 mg of elemental calcium total) by mouth 2 (two) times daily with a meal.   ezetimibe 10 MG tablet Commonly known as: ZETIA Take 1 tablet by mouth daily.   famotidine 20 MG tablet Commonly known as: PEPCID Take 1 tablet (20 mg total) by mouth daily.   ferrous sulfate 325 (65 FE) MG EC tablet Take 1 tablet (325 mg total) by mouth 2 (two) times daily.   furosemide 20 MG tablet Commonly known as: LASIX Take 2 tablets (40 mg total) by mouth daily. What changed: how much to take   ipratropium 0.02 % nebulizer solution Commonly  known as: ATROVENT Take 2.5 mLs (0.5 mg total) by nebulization every 6 (six) hours as needed for wheezing or shortness of breath (cough).   levothyroxine 50 MCG tablet Commonly known as: Euthyrox Take 1 tablet (50 mcg total) by mouth daily at 6 (six) AM.   megestrol 400 MG/10ML suspension Commonly known as: MEGACE Take 10 mLs (400 mg total) by mouth 2 (two) times daily.   melatonin 3 MG Tabs tablet Take 1 tablet (3 mg total) by mouth at bedtime as needed.   metoprolol succinate 25 MG 24 hr tablet Commonly known as: TOPROL-XL Take 1/2 tablet (12.5 mg total) by mouth every morning.   multivitamin with minerals Tabs tablet Take 1 tablet by mouth daily.   polyethylene glycol 17 g packet Commonly known as: MIRALAX / GLYCOLAX Take 17 g by mouth daily as needed for moderate constipation.   senna-docusate 8.6-50 MG tablet Commonly known as: Senokot-S Take 1 tablet by mouth 2 (two) times daily between meals as needed for mild constipation.   Xarelto 15 MG Tabs tablet Generic drug: Rivaroxaban Take 1 tablet (15 mg total) by mouth daily with supper.        Consultations: Cardiology  Procedures/Studies:   DG Chest 2 View  Result Date: 01/12/2021 CLINICAL DATA:  Chest pain EXAM: CHEST - 2 VIEW COMPARISON:  Chest x-ray dated November 07, 2020 FINDINGS: Unchanged cardiac and mediastinal contours. Mild bilateral interstitial opacities. Trace bilateral pleural effusions. No evidence of pneumothorax. IMPRESSION: 1. Mild bilateral interstitial opacities, likely due to pulmonary edema. 2. Trace bilateral pleural effusions. Electronically Signed   By: Yetta Glassman M.D.   On: 01/12/2021 10:34       The results of significant diagnostics from this hospitalization (including imaging, microbiology, ancillary and laboratory) are listed below for reference.     Microbiology: Recent Results (from the past 240 hour(s))  Resp Panel by RT-PCR (Flu A&B, Covid) Nasopharyngeal Swab      Status: None   Collection Time: 01/12/21  3:59 PM   Specimen: Nasopharyngeal Swab; Nasopharyngeal(NP) swabs in vial transport medium  Result Value Ref Range Status   SARS Coronavirus 2 by RT PCR NEGATIVE NEGATIVE Final    Comment: (NOTE) SARS-CoV-2 target nucleic acids are NOT  DETECTED.  The SARS-CoV-2 RNA is generally detectable in upper respiratory specimens during the acute phase of infection. The lowest concentration of SARS-CoV-2 viral copies this assay can detect is 138 copies/mL. A negative result does not preclude SARS-Cov-2 infection and should not be used as the sole basis for treatment or other patient management decisions. A negative result may occur with  improper specimen collection/handling, submission of specimen other than nasopharyngeal swab, presence of viral mutation(s) within the areas targeted by this assay, and inadequate number of viral copies(<138 copies/mL). A negative result must be combined with clinical observations, patient history, and epidemiological information. The expected result is Negative.  Fact Sheet for Patients:  EntrepreneurPulse.com.au  Fact Sheet for Healthcare Providers:  IncredibleEmployment.be  This test is no t yet approved or cleared by the [place] FDA and  has been authorized for detection and/or diagnosis of SARS-CoV-2 by FDA under an Emergency Use Authorization (EUA). This EUA will remain  in effect (meaning this test can be used) for the duration of the COVID-19 declaration under Section 564(b)(1) of the Act, 21 U.S.C.section 360bbb-3(b)(1), unless the authorization is terminated  or revoked sooner.       Influenza A by PCR NEGATIVE NEGATIVE Final   Influenza B by PCR NEGATIVE NEGATIVE Final    Comment: (NOTE) The Xpert Xpress SARS-CoV-2/FLU/RSV plus assay is intended as an aid in the diagnosis of influenza from Nasopharyngeal swab specimens and should not be used as a sole basis  for treatment. Nasal washings and aspirates are unacceptable for Xpert Xpress SARS-CoV-2/FLU/RSV testing.  Fact Sheet for Patients: EntrepreneurPulse.com.au  Fact Sheet for Healthcare Providers: IncredibleEmployment.be  This test is not yet approved or cleared by the [place] FDA and has been authorized for detection and/or diagnosis of SARS-CoV-2 by FDA under an Emergency Use Authorization (EUA). This EUA will remain in effect (meaning this test can be used) for the duration of the COVID-19 declaration under Section 564(b)(1) of the Act, 21 U.S.C. section 360bbb-3(b)(1), unless the authorization is terminated or revoked.  Performed at [facility], [address] [place], [place] [address]      Labs:  CBC: Recent Labs  Lab 01/12/21 [phone] 01/12/21 1559 01/13/21 0603 01/14/21 0422 01/15/21 0549  WBC 7.1 6.1 8.0 8.6  --   HGB 7.7* 8.2* 7.3* 8.3* 8.4*  HCT 25.9* 27.4* 23.9* 26.2* 27.4*  MCV 82.7 82.5 79.1* 78.9*  --   PLT 509* 457* 503* 480*  --    BMP &GFR Recent Labs  Lab 01/12/21 0955 01/12/21 1559 01/13/21 0603 01/14/21 0422 01/15/21 0549  NA 137  --  134* 135 137  K 4.5  --  4.0 3.6 4.0  CL 111  --  107 108 106  CO2 20*  --  24 22 26   GLUCOSE 98  --  93 92 91  BUN 18  --  18 22 18   CREATININE 1.11* 1.18* 1.31* 1.21* 1.18*  CALCIUM 8.7*  --  8.4* 8.2* 8.9  MG  --   --  2.2 2.4 2.5*  PHOS  --   --   --  3.1 3.2   Estimated Creatinine Clearance: 26.4 mL/min (A) (by C-G formula based on SCr of 1.18 mg/dL (H)). Liver & Pancreas: Recent Labs  Lab 01/12/21 1343 01/14/21 0422 01/15/21 0549  AST 52*  --   --   ALT 56*  --   --   ALKPHOS 96  --   --   BILITOT 1.0  --   --  PROT 6.7  --   --   ALBUMIN 3.3* 3.1* 3.2*   No results for input(s): LIPASE, AMYLASE in the last 168 hours. No results for input(s): AMMONIA in the last 168 hours. Diabetic: No results for input(s): HGBA1C in the last 72  hours. No results for input(s): GLUCAP in the last 168 hours. Cardiac Enzymes: No results for input(s): CKTOTAL, CKMB, CKMBINDEX, TROPONINI in the last 168 hours. No results for input(s): PROBNP in the last 8760 hours. Coagulation Profile: No results for input(s): INR, PROTIME in the last 168 hours. Thyroid Function Tests: Recent Labs    01/14/21 0422  TSH 4.583*   Lipid Profile: No results for input(s): CHOL, HDL, LDLCALC, TRIG, CHOLHDL, LDLDIRECT in the last 72 hours. Anemia Panel: No results for input(s): VITAMINB12, FOLATE, FERRITIN, TIBC, IRON, RETICCTPCT in the last 72 hours. Urine analysis:    Component Value Date/Time   COLORURINE YELLOW (A) 09/08/2020 0105   APPEARANCEUR HAZY (A) 09/08/2020 0105   APPEARANCEUR Clear 02/05/2013 1630   LABSPEC 1.024 09/08/2020 0105   LABSPEC 1.009 02/05/2013 1630   PHURINE 6.0 09/08/2020 0105   GLUCOSEU NEGATIVE 09/08/2020 0105   GLUCOSEU NEGATIVE 04/04/2018 1149   HGBUR NEGATIVE 09/08/2020 0105   BILIRUBINUR NEGATIVE 09/08/2020 0105   BILIRUBINUR 1 08/31/2017 1157   BILIRUBINUR Negative 02/05/2013 1630   KETONESUR 5 (A) 09/08/2020 0105   PROTEINUR 100 (A) 09/08/2020 0105   UROBILINOGEN 1.0 04/04/2018 1149   NITRITE NEGATIVE 09/08/2020 0105   LEUKOCYTESUR NEGATIVE 09/08/2020 0105   LEUKOCYTESUR 1+ 02/05/2013 1630   Sepsis Labs: Invalid input(s): PROCALCITONIN, LACTICIDVEN   Time coordinating discharge: 45 minutes  SIGNED:  Mercy Riding, MD  [facility] 01/15/2021, 4:25 PM

## 2021-01-18 ENCOUNTER — Telehealth: Payer: Self-pay

## 2021-01-18 NOTE — Telephone Encounter (Signed)
Transition Care Management Unsuccessful Follow-up Telephone Call  Date of discharge and from where:  01/15/21-[facility]  Attempts:  1st Attempt  Reason for unsuccessful TCM follow-up call:  Unable to reach patient. Left message to call the office back. HFU tentative hold 01/25/21 @ 11:00. Will follow.

## 2021-01-19 NOTE — Telephone Encounter (Signed)
Transition Care Management Unsuccessful Follow-up Telephone Call  Date of discharge and from where:  01/15/21-[facility]  Attempts:  2nd Attempt  Reason for unsuccessful TCM follow-up call:  Left voice message. Will follow.

## 2021-01-20 ENCOUNTER — Ambulatory Visit: Payer: Medicare HMO | Admitting: Family

## 2021-01-21 DIAGNOSIS — I5023 Acute on chronic systolic (congestive) heart failure: Secondary | ICD-10-CM | POA: Diagnosis not present

## 2021-01-21 DIAGNOSIS — R778 Other specified abnormalities of plasma proteins: Secondary | ICD-10-CM | POA: Diagnosis not present

## 2021-01-21 DIAGNOSIS — I480 Paroxysmal atrial fibrillation: Secondary | ICD-10-CM | POA: Diagnosis not present

## 2021-01-21 DIAGNOSIS — R001 Bradycardia, unspecified: Secondary | ICD-10-CM | POA: Diagnosis not present

## 2021-01-21 NOTE — Telephone Encounter (Signed)
Transition Care Management Unsuccessful Follow-up Telephone Call  Date of discharge and from where:  01/15/21-[facility]  Attempts:  3rd Attempt  Unsuccessful TCM follow-up call:  [facility] Follow up scheduled 01/25/21 at 11:00 with pcp. [facility] scheduled 12/13. Keep all routine appointments.

## 2021-01-25 ENCOUNTER — Ambulatory Visit (INDEPENDENT_AMBULATORY_CARE_PROVIDER_SITE_OTHER): Payer: Medicare HMO | Admitting: Internal Medicine

## 2021-01-25 ENCOUNTER — Other Ambulatory Visit: Payer: Self-pay

## 2021-01-25 ENCOUNTER — Encounter: Payer: Self-pay | Admitting: Internal Medicine

## 2021-01-25 VITALS — BP 126/56 | HR 51 | Temp 96.1°F | Ht 63.0 in | Wt 113.2 lb

## 2021-01-25 DIAGNOSIS — D649 Anemia, unspecified: Secondary | ICD-10-CM

## 2021-01-25 DIAGNOSIS — F409 Phobic anxiety disorder, unspecified: Secondary | ICD-10-CM | POA: Diagnosis not present

## 2021-01-25 DIAGNOSIS — D509 Iron deficiency anemia, unspecified: Secondary | ICD-10-CM | POA: Insufficient documentation

## 2021-01-25 DIAGNOSIS — F5105 Insomnia due to other mental disorder: Secondary | ICD-10-CM

## 2021-01-25 DIAGNOSIS — Z09 Encounter for follow-up examination after completed treatment for conditions other than malignant neoplasm: Secondary | ICD-10-CM

## 2021-01-25 DIAGNOSIS — E876 Hypokalemia: Secondary | ICD-10-CM | POA: Diagnosis not present

## 2021-01-25 LAB — CBC WITH DIFFERENTIAL/PLATELET
Basophils Absolute: 0.1 10*3/uL (ref 0.0–0.1)
Basophils Relative: 1.9 % (ref 0.0–3.0)
Eosinophils Absolute: 0.1 10*3/uL (ref 0.0–0.7)
Eosinophils Relative: 0.8 % (ref 0.0–5.0)
HCT: 32.6 % — ABNORMAL LOW (ref 36.0–46.0)
Hemoglobin: 10 g/dL — ABNORMAL LOW (ref 12.0–15.0)
Lymphocytes Relative: 10.7 % — ABNORMAL LOW (ref 12.0–46.0)
Lymphs Abs: 0.7 10*3/uL (ref 0.7–4.0)
MCHC: 30.7 g/dL (ref 30.0–36.0)
MCV: 84 fl (ref 78.0–100.0)
Monocytes Absolute: 0.8 10*3/uL (ref 0.1–1.0)
Monocytes Relative: 11.6 % (ref 3.0–12.0)
Neutro Abs: 5 10*3/uL (ref 1.4–7.7)
Neutrophils Relative %: 75 % (ref 43.0–77.0)
Platelets: 295 10*3/uL (ref 150.0–400.0)
RBC: 3.88 Mil/uL (ref 3.87–5.11)
RDW: 23.2 % — ABNORMAL HIGH (ref 11.5–15.5)
WBC: 6.7 10*3/uL (ref 4.0–10.5)

## 2021-01-25 LAB — BASIC METABOLIC PANEL
BUN: 17 mg/dL (ref 6–23)
CO2: 22 mEq/L (ref 19–32)
Calcium: 9.2 mg/dL (ref 8.4–10.5)
Chloride: 109 mEq/L (ref 96–112)
Creatinine, Ser: 1.2 mg/dL (ref 0.40–1.20)
GFR: 40.45 mL/min — ABNORMAL LOW (ref >60.00)
Glucose, Bld: 77 mg/dL (ref 70–99)
Potassium: 5 mEq/L (ref 3.5–5.1)
Sodium: 140 mEq/L (ref 135–145)

## 2021-01-25 LAB — MAGNESIUM: Magnesium: 2.3 mg/dL (ref 1.5–2.5)

## 2021-01-25 MED ORDER — MIRTAZAPINE 7.5 MG PO TABS: 7.5000 mg | ORAL_TABLET | Freq: Every day | ORAL | 2 refills | Status: DC

## 2021-01-25 NOTE — Assessment & Plan Note (Signed)
Etiology unclear.  Hemoccult testing during admission was negative.  She was mildly anemic prior to initiation of Xarelto in October,  But workup was not pursued due to palliative care consult.  She has new onset CKD. . Will check EPO, SPEP, urine IFE,  Continue iron supplements and initiate hematology referral

## 2021-01-25 NOTE — Progress Notes (Signed)
Pre visit review using our clinic review tool, if applicable. No additional management support is needed unless otherwise documented below in the visit note. 

## 2021-01-25 NOTE — Assessment & Plan Note (Signed)
She tolerated mirtazapine in 2021 , which will address her depression , insomnia and anorexia. Starting dose 7.5 mg daily

## 2021-01-25 NOTE — Assessment & Plan Note (Addendum)
Patient is stable post discharge from [facility] on Dec 2 . All  questions about discharge plans were addressed at the visit today for hospital follow up. All labs , imaging studies and progress notes from admission were reviewed with patient today  And additional labs and referral to hematology have been ordered

## 2021-01-25 NOTE — Patient Instructions (Addendum)
I am starting you on Remeron again.  (Mirtazapine) to help your insomnia and poor  appetite.    Please take the mirtazapine at bedtime; the  starting dose is 7.5 mg ( half tablet)  .  Take it every night  Do not take the alprazolam at the same time.  Wait at least an hour to see if the mirtazapine makes you sleepy before adding the alprazolam   I am referring you to hematology and nephrology for the anemia and kidney issues

## 2021-01-25 NOTE — Progress Notes (Signed)
Subjective:  Patient ID: Frances Kent, female    DOB: 30-Jan-1931  Age: 87 y.o. MRN: [id number]  CC: The primary encounter diagnosis was Anemia, unspecified type. Diagnoses of Hypokalemia, Iron deficiency anemia, unspecified iron deficiency anemia type, Insomnia due to anxiety and fear, and Hospital discharge follow-up were also pertinent to this visit.  HPI LORITA FORINASH presents for  Chief Complaint  Patient presents with   [facility] Follow Up for anemia   This visit occurred during the SARS-CoV-2 public health emergency.  Safety protocols were in place, including screening questions prior to the visit, additional usage of staff PPE, and extensive cleaning of exam room while observing appropriate contact time as indicated for disinfecting solutions.   87 yr old female with severe mitral regurgitation,  atrial fibrillation,  cardiomyopathy EF 40 to 45%  history of acute Left basal ganglia CVA Sept 2022 s/p mechanical thrombectomy of Left ICA , Admitted to [facility] with  symptomatic anemia, (hgb 7.3)  atrial fib with RVR, and CHF on Nov 29.  She was diuresed, transfused 1 unit PRBCs  on Nov 30 and iron, and discharged on Dec 2 with hgb 8.4 .   She saw Dr [name] on Dec 8 and repeat labs were done noting an improvement in hgb from  8.4 to 9.9 .  She is taking iron supplements twice daily .  Appetite remains poor and she is not sleeping well despite taking a dose of alprazolam (0.125 mg)  she reports frequent wakienngs.  She was taking megace, prescribed by palliative care,  since her October admission,  and had gained weight,  but has not resumed megace and is losing weight again,    Her cc is fatigue, and malaise,   and she reports occasional dyspnea, but  not with ADL's.  "I'm having problems breathing.     I feel so bad    Unable to take metoprolol due to low BP.  Last dose 2 weeks ago.  Pulse has been 54 to 56;   bp yesterday was  124/50  Outpatient Medications Prior to  Visit  Medication Sig Dispense Refill   acetaminophen (TYLENOL) 500 MG tablet Take 500 mg by mouth every 6 (six) hours as needed.     ALPRAZolam (XANAX) 0.25 MG tablet Take 1 tablet (0.25 mg total) by mouth at bedtime as needed for anxiety. 20 tablet 0   amiodarone (PACERONE) 200 MG tablet Take 1/2 tablet (100 mg total) by mouth daily. 15 tablet 0   ferrous sulfate 325 (65 FE) MG EC tablet Take 1 tablet (325 mg total) by mouth 2 (two) times daily. 180 tablet 1   furosemide (LASIX) 20 MG tablet Take 2 tablets (40 mg total) by mouth daily. 90 tablet 1   levothyroxine (EUTHYROX) 50 MCG tablet Take 1 tablet (50 mcg total) by mouth daily at 6 (six) AM. 30 tablet 0   Multiple Vitamin (MULTIVITAMIN WITH MINERALS) TABS tablet Take 1 tablet by mouth daily.     Rivaroxaban (XARELTO) 15 MG TABS tablet Take 1 tablet (15 mg total) by mouth daily with supper. 30 tablet 0   senna-docusate (SENOKOT-S) 8.6-50 MG tablet Take 1 tablet by mouth 2 (two) times daily between meals as needed for mild constipation. 60 tablet 0   calcium carbonate (TUMS - DOSED IN MG ELEMENTAL CALCIUM) 500 MG chewable tablet Chew 1 tablet (200 mg of elemental calcium total) by mouth 2 (two) times daily with a meal.  ezetimibe (ZETIA) 10 MG tablet Take 1 tablet by mouth daily. (Patient not taking: Reported on 01/12/2021)     famotidine (PEPCID) 20 MG tablet Take 1 tablet (20 mg total) by mouth daily. (Patient not taking: Reported on 01/12/2021) 30 tablet 0   ipratropium (ATROVENT) 0.02 % nebulizer solution Take 2.5 mLs (0.5 mg total) by nebulization every 6 (six) hours as needed for wheezing or shortness of breath (cough). 75 mL 12   megestrol (MEGACE) 400 MG/10ML suspension Take 10 mLs (400 mg total) by mouth 2 (two) times daily. (Patient not taking: Reported on 01/12/2021) 600 mL 1   melatonin 3 MG TABS tablet Take 1 tablet (3 mg total) by mouth at bedtime as needed. (Patient not taking: Reported on 01/12/2021) 30 tablet 0   metoprolol  succinate (TOPROL-XL) 25 MG 24 hr tablet Take 1/2 tablet (12.5 mg total) by mouth every morning. 15 tablet 0   polyethylene glycol (MIRALAX / GLYCOLAX) 17 g packet Take 17 g by mouth daily as needed for moderate constipation. 14 each 0   No facility-administered medications prior to visit.    Review of Systems;  Patient denies headache, fevers, malaise, unintentional weight loss, skin rash, eye pain, sinus congestion and sinus pain, sore throat, dysphagia,  hemoptysis , cough, dyspnea, wheezing, chest pain, palpitations, orthopnea, edema, abdominal pain, nausea, melena, diarrhea, constipation, flank pain, dysuria, hematuria, urinary  Frequency, nocturia, numbness, tingling, seizures,  Focal weakness, Loss of consciousness,  Tremor, insomnia, depression, anxiety, and suicidal ideation.      Objective:  BP (!) 126/56   Pulse (!) 51   Temp (!) 96.1 F (35.6 C) (Skin)   Ht 5\' 3"  (1.6 m)   Wt 113 lb 3.2 oz (51.3 kg)   SpO2 98%   BMI 20.05 kg/m   BP Readings from Last 3 Encounters:  01/25/21 (!) 126/56  01/15/21 (!) 138/49  12/07/20 (!) 124/52    Wt Readings from Last 3 Encounters:  01/25/21 113 lb 3.2 oz (51.3 kg)  01/15/21 (P) 111 lb 1 oz (50.4 kg)  12/07/20 114 lb (51.7 kg)    General appearance: chronically ill appearing,  in no acute distress , alert, cooperative and appears stated age Ears: normal TM's and external ear canals both ears Throat: lips, mucosa, and tongue normal; teeth and gums normal Neck: no adenopathy, no carotid bruit, supple, symmetrical, trachea midline and thyroid not enlarged, symmetric, no tenderness/mass/nodules Back: symmetric, no curvature. ROM normal. No CVA tenderness. Lungs: clear to auscultation bilaterally Heart: regular rate and rhythm, S1, S2 normal, no murmur, click, rub or gallop Abdomen: soft, non-tender; bowel sounds normal; no masses,  no organomegaly Pulses: 2+ and symmetric Skin: Skin color, texture, turgor normal. No rashes or  lesions Lymph nodes: Cervical, supraclavicular, and axillary nodes normal.  Lab Results  Component Value Date   HGBA1C 5.8 (H) 11/02/2020   HGBA1C 6.1 (H) 10/16/2020    Lab Results  Component Value Date   CREATININE 1.18 (H) 01/15/2021   CREATININE 1.21 (H) 01/14/2021   CREATININE 1.31 (H) 01/13/2021    Lab Results  Component Value Date   WBC 8.6 01/14/2021   HGB 8.4 (L) 01/15/2021   HCT 27.4 (L) 01/15/2021   PLT 480 (H) 01/14/2021   GLUCOSE 91 01/15/2021   CHOL 144 11/02/2020   TRIG 76 11/02/2020   TRIG 72 11/02/2020   HDL 48 11/02/2020   LDLDIRECT 185.3 03/27/2013   LDLCALC 81 11/02/2020   ALT 56 (H) 01/12/2021   AST 52 (H)  01/12/2021   NA 137 01/15/2021   K 4.0 01/15/2021   CL 106 01/15/2021   CREATININE 1.18 (H) 01/15/2021   BUN 18 01/15/2021   CO2 26 01/15/2021   TSH 4.583 (H) 01/14/2021   INR 3.0 (H) 10/15/2020   HGBA1C 5.8 (H) 11/02/2020    DG Chest 2 View  Result Date: 01/12/2021 CLINICAL DATA:  Chest pain EXAM: CHEST - 2 VIEW COMPARISON:  Chest x-ray dated November 07, 2020 FINDINGS: Unchanged cardiac and mediastinal contours. Mild bilateral interstitial opacities. Trace bilateral pleural effusions. No evidence of pneumothorax. IMPRESSION: 1. Mild bilateral interstitial opacities, likely due to pulmonary edema. 2. Trace bilateral pleural effusions. Electronically Signed   By: Yetta Glassman M.D.   On: 01/12/2021 10:34    Assessment & Plan:   Problem List Items Addressed This Visit     Iron deficiency anemia    Etiology unclear.  Hemoccult testing during admission was negative.  She was mildly anemic prior to initiation of Xarelto in October,  But workup was not pursued due to palliative care consult.  She has new onset CKD. . Will check EPO, SPEP, urine IFE,  Continue iron supplements and initiate hematology referral       Relevant Orders   Ambulatory referral to Hematology / Oncology   Insomnia due to anxiety and fear    She tolerated  mirtazapine in 2021 , which will address her depression , insomnia and anorexia. Starting dose 7.5 mg daily      Hospital discharge follow-up    Patient is stable post discharge from [facility] on Dec 2 . All  questions about discharge plans were addressed at the visit today for hospital follow up. All labs , imaging studies and progress notes from admission were reviewed with patient today  And additional labs and referral to hematology have been ordered       Other Visit Diagnoses     Anemia, unspecified type    -  Primary   Relevant Orders   CBC with Differential/Platelet   Erythropoietin   Basic metabolic panel   Protein electrophoresis, serum   IFE AND PE, RANDOM URINE   Hypokalemia       Relevant Orders   Magnesium       I have discontinued Blanch [employer] A. Deterding's polyethylene glycol, ipratropium, calcium carbonate, famotidine, melatonin, metoprolol succinate, megestrol, and ezetimibe. I am also having her start on mirtazapine. Additionally, I am having her maintain her acetaminophen, levothyroxine, multivitamin with minerals, Rivaroxaban, amiodarone, ALPRAZolam, furosemide, ferrous sulfate, and senna-docusate.  Meds ordered this encounter  Medications   mirtazapine (REMERON) 7.5 MG tablet    Sig: Take 1 tablet (7.5 mg total) by mouth at bedtime.    Dispense:  30 tablet    Refill:  2     I provided  30 minutes of  face-to-face time during this encounter reviewing patient's current problems and past surgeries, labs and imaging studies, providing counseling on the above mentioned problems , and coordination  of care .   Follow-up: Return in about 4 weeks (around 02/22/2021).   [name], MD

## 2021-01-26 ENCOUNTER — Inpatient Hospital Stay: Payer: Medicare HMO | Admitting: Adult Health

## 2021-01-26 DIAGNOSIS — H353221 Exudative age-related macular degeneration, left eye, with active choroidal neovascularization: Secondary | ICD-10-CM | POA: Diagnosis not present

## 2021-01-26 DIAGNOSIS — H353212 Exudative age-related macular degeneration, right eye, with inactive choroidal neovascularization: Secondary | ICD-10-CM | POA: Diagnosis not present

## 2021-01-27 LAB — PROTEIN ELECTROPHORESIS, SERUM
Albumin ELP: 3.8 g/dL (ref 3.8–4.8)
Alpha 1: 0.3 g/dL (ref 0.2–0.3)
Alpha 2: 0.8 g/dL (ref 0.5–0.9)
Beta 2: 0.3 g/dL (ref 0.2–0.5)
Beta Globulin: 0.4 g/dL (ref 0.4–0.6)
Gamma Globulin: 1 g/dL (ref 0.8–1.7)
Total Protein: 6.7 g/dL (ref 6.1–8.1)

## 2021-01-27 LAB — ERYTHROPOIETIN: Erythropoietin: 35.5 m[IU]/mL — ABNORMAL HIGH (ref 2.6–18.5)

## 2021-01-28 ENCOUNTER — Telehealth: Payer: Self-pay | Admitting: Family

## 2021-01-28 ENCOUNTER — Ambulatory Visit: Payer: Medicare HMO | Admitting: Family

## 2021-01-28 NOTE — Telephone Encounter (Signed)
Patient did not show for her [facility] Failure [facility] appointment on 01/28/21. Will attempt to reschedule.

## 2021-01-28 NOTE — Progress Notes (Deleted)
°   Patient ID: Frances Kent, female    DOB: 09/09/1930, 87 y.o.   MRN: [id number]  HPI  Ms Muro is a 87 y/o female with a history of  Echo report from 11/01/20 reviewed and showed an EF of 40-45% with moderate LAE, moderate TR and moderate/severe MR.   Admitted 01/12/21 due to worsening shortness of breath and elevated BP. Found to be in AF RVR. Cardiology consult obtained. Given IV lasix with transition to oral diuretics. Started on cardizem drip but then converted to NSR. Transfused 1 unit PRBC's due to anemia. Discharged after 3 days.   She presents today for her initial visit with a chief complaint of   Review of Systems    Physical Exam    Assessment & Plan:  1: Chronic heart failure with reduced ejection fraction- - NYHA class - BNP 01/15/21 was 502.7  2: HTN- - BP - saw PCP Derrel Nip) 01/25/21 - BMP 01/25/21 reviewed and showed sodium 140, potassium 5.0, creatinine 1.2 and GFR 40.45  3: PAF- - saw cardiology [name]) 01/21/21

## 2021-01-29 DIAGNOSIS — H353221 Exudative age-related macular degeneration, left eye, with active choroidal neovascularization: Secondary | ICD-10-CM | POA: Diagnosis not present

## 2021-01-29 DIAGNOSIS — D649 Anemia, unspecified: Secondary | ICD-10-CM | POA: Diagnosis not present

## 2021-02-02 ENCOUNTER — Encounter: Payer: Self-pay | Admitting: Emergency Medicine

## 2021-02-02 ENCOUNTER — Other Ambulatory Visit: Payer: Self-pay

## 2021-02-02 ENCOUNTER — Inpatient Hospital Stay
Admission: EM | Admit: 2021-02-02 | Discharge: 2021-02-10 | DRG: 871 | Disposition: A | Discharge status: Home Health | Payer: Medicare HMO | Attending: Internal Medicine | Admitting: Internal Medicine

## 2021-02-02 ENCOUNTER — Emergency Department: Payer: Medicare HMO

## 2021-02-02 DIAGNOSIS — Z8673 Personal history of transient ischemic attack (TIA), and cerebral infarction without residual deficits: Secondary | ICD-10-CM | POA: Diagnosis not present

## 2021-02-02 DIAGNOSIS — J189 Pneumonia, unspecified organism: Secondary | ICD-10-CM

## 2021-02-02 DIAGNOSIS — R6521 Severe sepsis with septic shock: Secondary | ICD-10-CM | POA: Diagnosis present

## 2021-02-02 DIAGNOSIS — I471 Supraventricular tachycardia: Secondary | ICD-10-CM | POA: Diagnosis present

## 2021-02-02 DIAGNOSIS — J156 Pneumonia due to other aerobic Gram-negative bacteria: Secondary | ICD-10-CM | POA: Diagnosis present

## 2021-02-02 DIAGNOSIS — N179 Acute kidney failure, unspecified: Secondary | ICD-10-CM | POA: Diagnosis present

## 2021-02-02 DIAGNOSIS — G9341 Metabolic encephalopathy: Secondary | ICD-10-CM | POA: Diagnosis present

## 2021-02-02 DIAGNOSIS — I639 Cerebral infarction, unspecified: Secondary | ICD-10-CM | POA: Diagnosis present

## 2021-02-02 DIAGNOSIS — Z79890 Hormone replacement therapy (postmenopausal): Secondary | ICD-10-CM

## 2021-02-02 DIAGNOSIS — Z823 Family history of stroke: Secondary | ICD-10-CM

## 2021-02-02 DIAGNOSIS — R069 Unspecified abnormalities of breathing: Secondary | ICD-10-CM | POA: Diagnosis not present

## 2021-02-02 DIAGNOSIS — I4891 Unspecified atrial fibrillation: Secondary | ICD-10-CM

## 2021-02-02 DIAGNOSIS — Z681 Body mass index (BMI) 19 or less, adult: Secondary | ICD-10-CM | POA: Diagnosis not present

## 2021-02-02 DIAGNOSIS — I341 Nonrheumatic mitral (valve) prolapse: Secondary | ICD-10-CM | POA: Diagnosis present

## 2021-02-02 DIAGNOSIS — Z803 Family history of malignant neoplasm of breast: Secondary | ICD-10-CM | POA: Diagnosis not present

## 2021-02-02 DIAGNOSIS — A419 Sepsis, unspecified organism: Secondary | ICD-10-CM

## 2021-02-02 DIAGNOSIS — I10 Essential (primary) hypertension: Secondary | ICD-10-CM | POA: Diagnosis not present

## 2021-02-02 DIAGNOSIS — N1831 Chronic kidney disease, stage 3a: Secondary | ICD-10-CM | POA: Diagnosis present

## 2021-02-02 DIAGNOSIS — Z888 Allergy status to other drugs, medicaments and biological substances status: Secondary | ICD-10-CM

## 2021-02-02 DIAGNOSIS — E872 Acidosis, unspecified: Secondary | ICD-10-CM | POA: Diagnosis present

## 2021-02-02 DIAGNOSIS — Y95 Nosocomial condition: Secondary | ICD-10-CM | POA: Diagnosis present

## 2021-02-02 DIAGNOSIS — I495 Sick sinus syndrome: Secondary | ICD-10-CM | POA: Diagnosis present

## 2021-02-02 DIAGNOSIS — Z800 Family history of malignant neoplasm of digestive organs: Secondary | ICD-10-CM

## 2021-02-02 DIAGNOSIS — Z7901 Long term (current) use of anticoagulants: Secondary | ICD-10-CM

## 2021-02-02 DIAGNOSIS — R6520 Severe sepsis without septic shock: Secondary | ICD-10-CM

## 2021-02-02 DIAGNOSIS — F418 Other specified anxiety disorders: Secondary | ICD-10-CM | POA: Diagnosis present

## 2021-02-02 DIAGNOSIS — I6523 Occlusion and stenosis of bilateral carotid arteries: Secondary | ICD-10-CM | POA: Diagnosis present

## 2021-02-02 DIAGNOSIS — R0602 Shortness of breath: Secondary | ICD-10-CM | POA: Diagnosis not present

## 2021-02-02 DIAGNOSIS — Z8249 Family history of ischemic heart disease and other diseases of the circulatory system: Secondary | ICD-10-CM

## 2021-02-02 DIAGNOSIS — I482 Chronic atrial fibrillation, unspecified: Secondary | ICD-10-CM | POA: Diagnosis not present

## 2021-02-02 DIAGNOSIS — Z20822 Contact with and (suspected) exposure to covid-19: Secondary | ICD-10-CM | POA: Diagnosis not present

## 2021-02-02 DIAGNOSIS — D509 Iron deficiency anemia, unspecified: Secondary | ICD-10-CM | POA: Diagnosis not present

## 2021-02-02 DIAGNOSIS — I130 Hypertensive heart and chronic kidney disease with heart failure and stage 1 through stage 4 chronic kidney disease, or unspecified chronic kidney disease: Secondary | ICD-10-CM | POA: Diagnosis not present

## 2021-02-02 DIAGNOSIS — R001 Bradycardia, unspecified: Secondary | ICD-10-CM | POA: Diagnosis not present

## 2021-02-02 DIAGNOSIS — F419 Anxiety disorder, unspecified: Secondary | ICD-10-CM | POA: Diagnosis present

## 2021-02-02 DIAGNOSIS — F32A Depression, unspecified: Secondary | ICD-10-CM | POA: Diagnosis present

## 2021-02-02 DIAGNOSIS — J849 Interstitial pulmonary disease, unspecified: Secondary | ICD-10-CM | POA: Diagnosis not present

## 2021-02-02 DIAGNOSIS — I5042 Chronic combined systolic (congestive) and diastolic (congestive) heart failure: Secondary | ICD-10-CM | POA: Diagnosis present

## 2021-02-02 DIAGNOSIS — N1832 Chronic kidney disease, stage 3b: Secondary | ICD-10-CM | POA: Diagnosis present

## 2021-02-02 DIAGNOSIS — E034 Atrophy of thyroid (acquired): Secondary | ICD-10-CM | POA: Diagnosis not present

## 2021-02-02 DIAGNOSIS — I480 Paroxysmal atrial fibrillation: Secondary | ICD-10-CM | POA: Diagnosis present

## 2021-02-02 DIAGNOSIS — E785 Hyperlipidemia, unspecified: Secondary | ICD-10-CM | POA: Diagnosis present

## 2021-02-02 DIAGNOSIS — J80 Acute respiratory distress syndrome: Secondary | ICD-10-CM | POA: Diagnosis not present

## 2021-02-02 DIAGNOSIS — Z79899 Other long term (current) drug therapy: Secondary | ICD-10-CM

## 2021-02-02 DIAGNOSIS — J9601 Acute respiratory failure with hypoxia: Secondary | ICD-10-CM | POA: Diagnosis not present

## 2021-02-02 DIAGNOSIS — J90 Pleural effusion, not elsewhere classified: Secondary | ICD-10-CM | POA: Diagnosis not present

## 2021-02-02 DIAGNOSIS — I5023 Acute on chronic systolic (congestive) heart failure: Secondary | ICD-10-CM | POA: Diagnosis present

## 2021-02-02 DIAGNOSIS — I5022 Chronic systolic (congestive) heart failure: Secondary | ICD-10-CM | POA: Diagnosis not present

## 2021-02-02 DIAGNOSIS — R9389 Abnormal findings on diagnostic imaging of other specified body structures: Secondary | ICD-10-CM | POA: Diagnosis not present

## 2021-02-02 DIAGNOSIS — Z86711 Personal history of pulmonary embolism: Secondary | ICD-10-CM | POA: Diagnosis present

## 2021-02-02 DIAGNOSIS — K219 Gastro-esophageal reflux disease without esophagitis: Secondary | ICD-10-CM | POA: Diagnosis present

## 2021-02-02 DIAGNOSIS — R54 Age-related physical debility: Secondary | ICD-10-CM | POA: Diagnosis present

## 2021-02-02 DIAGNOSIS — I517 Cardiomegaly: Secondary | ICD-10-CM | POA: Diagnosis not present

## 2021-02-02 DIAGNOSIS — E039 Hypothyroidism, unspecified: Secondary | ICD-10-CM | POA: Diagnosis not present

## 2021-02-02 HISTORY — DX: Sepsis, unspecified organism: A41.9

## 2021-02-02 HISTORY — DX: Pneumonia, unspecified organism: J18.9

## 2021-02-02 HISTORY — DX: Severe sepsis without septic shock: R65.20

## 2021-02-02 LAB — CBC WITH DIFFERENTIAL/PLATELET
Abs Immature Granulocytes: 0.07 10*3/uL (ref 0.00–0.07)
Basophils Absolute: 0.1 10*3/uL (ref 0.0–0.1)
Basophils Relative: 1 %
Eosinophils Absolute: 0.1 10*3/uL (ref 0.0–0.5)
Eosinophils Relative: 1 %
HCT: 32.1 % — ABNORMAL LOW (ref 36.0–46.0)
Hemoglobin: 10.1 g/dL — ABNORMAL LOW (ref 12.0–15.0)
Immature Granulocytes: 1 %
Lymphocytes Relative: 5 %
Lymphs Abs: 0.6 10*3/uL — ABNORMAL LOW (ref 0.7–4.0)
MCH: 26.8 pg (ref 26.0–34.0)
MCHC: 31.5 g/dL (ref 30.0–36.0)
MCV: 85.1 fL (ref 80.0–100.0)
Monocytes Absolute: 0.7 10*3/uL (ref 0.1–1.0)
Monocytes Relative: 6 %
Neutro Abs: 9.3 10*3/uL — ABNORMAL HIGH (ref 1.7–7.7)
Neutrophils Relative %: 86 %
Platelets: 259 10*3/uL (ref 150–400)
RBC: 3.77 MIL/uL — ABNORMAL LOW (ref 3.87–5.11)
RDW: 22.9 % — ABNORMAL HIGH (ref 11.5–15.5)
Smear Review: NORMAL
WBC: 10.8 10*3/uL — ABNORMAL HIGH (ref 4.0–10.5)
nRBC: 0 % (ref 0.0–0.2)

## 2021-02-02 LAB — BLOOD GAS, VENOUS
Acid-base deficit: 6.4 mmol/L — ABNORMAL HIGH (ref 0.0–2.0)
Bicarbonate: 17.9 mmol/L — ABNORMAL LOW (ref 20.0–28.0)
O2 Saturation: 59.3 %
Patient temperature: 37
pCO2, Ven: 31 mmHg — ABNORMAL LOW (ref 44.0–60.0)
pH, Ven: 7.37 (ref 7.250–7.430)
pO2, Ven: 32 mmHg (ref 32.0–45.0)

## 2021-02-02 LAB — IFE AND PE, RANDOM URINE
% BETA, Urine: 17.9 %
ALBUMIN, U: 48.3 %
ALPHA 1 URINE: 5.1 %
ALPHA-2-GLOBULIN, U: 10.4 %
GAMMA GLOBULIN URINE: 18.2 %
Protein, Ur: 30.8 mg/dL

## 2021-02-02 LAB — RESP PANEL BY RT-PCR (FLU A&B, COVID) ARPGX2
Influenza A by PCR: NEGATIVE
Influenza B by PCR: NEGATIVE
SARS Coronavirus 2 by RT PCR: NEGATIVE

## 2021-02-02 LAB — COMPREHENSIVE METABOLIC PANEL
ALT: 31 U/L (ref 0–44)
AST: 36 U/L (ref 15–41)
Albumin: 3.5 g/dL (ref 3.5–5.0)
Alkaline Phosphatase: 102 U/L (ref 38–126)
Anion gap: 10 (ref 5–15)
BUN: 20 mg/dL (ref 8–23)
CO2: 18 mmol/L — ABNORMAL LOW (ref 22–32)
Calcium: 9.1 mg/dL (ref 8.9–10.3)
Chloride: 105 mmol/L (ref 98–111)
Creatinine, Ser: 1.24 mg/dL — ABNORMAL HIGH (ref 0.44–1.00)
GFR, Estimated: 42 mL/min — ABNORMAL LOW (ref >60)
Glucose, Bld: 125 mg/dL — ABNORMAL HIGH (ref 70–99)
Potassium: 4.3 mmol/L (ref 3.5–5.1)
Sodium: 133 mmol/L — ABNORMAL LOW (ref 135–145)
Total Bilirubin: 2.8 mg/dL — ABNORMAL HIGH (ref 0.3–1.2)
Total Protein: 7 g/dL (ref 6.5–8.1)

## 2021-02-02 LAB — LACTIC ACID, PLASMA
Lactic Acid, Venous: 3 mmol/L (ref 0.5–1.9)
Lactic Acid, Venous: 1.8 mmol/L (ref 0.5–1.9)

## 2021-02-02 LAB — BRAIN NATRIURETIC PEPTIDE: B Natriuretic Peptide: 798.7 pg/mL — ABNORMAL HIGH (ref 0.0–100.0)

## 2021-02-02 LAB — PROCALCITONIN: Procalcitonin: 3.17 ng/mL

## 2021-02-02 MED ORDER — ONDANSETRON HCL 4 MG/2ML IJ SOLN
4.0000 mg | Freq: Three times a day (TID) | INTRAMUSCULAR | Status: DC | PRN
  Administered 2021-02-04: 09:00:00 4 mg via INTRAVENOUS
  Filled 2021-02-02: qty 2

## 2021-02-02 MED ORDER — ADULT MULTIVITAMIN W/MINERALS CH
1.0000 | ORAL_TABLET | Freq: Every day | ORAL | Status: DC
Start: 2021-02-02 — End: 2021-02-10
  Administered 2021-02-03 – 2021-02-10 (×8): 1 via ORAL
  Filled 2021-02-02 (×8): qty 1

## 2021-02-02 MED ORDER — LACTATED RINGERS IV SOLN: INTRAVENOUS | Status: DC

## 2021-02-02 MED ORDER — SODIUM CHLORIDE 0.9 % IV SOLN
2.0000 g | INTRAVENOUS | Status: DC
  Filled 2021-02-02: qty 2

## 2021-02-02 MED ORDER — MIRTAZAPINE 15 MG PO TABS
7.5000 mg | ORAL_TABLET | Freq: Every day | ORAL | Status: DC
  Administered 2021-02-02 – 2021-02-09 (×8): 7.5 mg via ORAL
  Filled 2021-02-02 (×8): qty 1

## 2021-02-02 MED ORDER — LACTATED RINGERS IV BOLUS (SEPSIS): 500.0000 mL | Freq: Once | INTRAVENOUS | Status: DC

## 2021-02-02 MED ORDER — VANCOMYCIN HCL IN DEXTROSE 1-5 GM/200ML-% IV SOLN: 1000.0000 mg | INTRAVENOUS | Status: DC

## 2021-02-02 MED ORDER — ALPRAZOLAM 0.25 MG PO TABS
0.2500 mg | ORAL_TABLET | Freq: Every evening | ORAL | Status: DC | PRN
  Administered 2021-02-02 – 2021-02-09 (×6): 0.25 mg via ORAL
  Filled 2021-02-02 (×7): qty 1

## 2021-02-02 MED ORDER — SODIUM CHLORIDE 0.9 % IV SOLN
2.0000 g | Freq: Once | INTRAVENOUS | Status: AC
  Administered 2021-02-02: 11:00:00 2 g via INTRAVENOUS
  Filled 2021-02-02: qty 2

## 2021-02-02 MED ORDER — PHENOL 1.4 % MT LIQD
1.0000 | OROMUCOSAL | Status: DC | PRN
  Administered 2021-02-03: 07:00:00 1 via OROMUCOSAL
  Filled 2021-02-02 (×2): qty 177

## 2021-02-02 MED ORDER — SENNOSIDES-DOCUSATE SODIUM 8.6-50 MG PO TABS
1.0000 | ORAL_TABLET | Freq: Two times a day (BID) | ORAL | Status: DC | PRN
  Administered 2021-02-04 – 2021-02-08 (×3): 1 via ORAL
  Filled 2021-02-02 (×3): qty 1

## 2021-02-02 MED ORDER — FERROUS SULFATE 325 (65 FE) MG PO TABS
325.0000 mg | ORAL_TABLET | Freq: Two times a day (BID) | ORAL | Status: DC
  Administered 2021-02-02 – 2021-02-10 (×16): 325 mg via ORAL
  Filled 2021-02-02 (×16): qty 1

## 2021-02-02 MED ORDER — AMIODARONE HCL 200 MG PO TABS
100.0000 mg | ORAL_TABLET | Freq: Two times a day (BID) | ORAL | Status: DC
  Administered 2021-02-02 – 2021-02-10 (×16): 100 mg via ORAL
  Filled 2021-02-02 (×16): qty 1

## 2021-02-02 MED ORDER — RIVAROXABAN 15 MG PO TABS
15.0000 mg | ORAL_TABLET | Freq: Every day | ORAL | Status: DC
  Administered 2021-02-03 – 2021-02-09 (×7): 15 mg via ORAL
  Filled 2021-02-02 (×8): qty 1

## 2021-02-02 MED ORDER — ALBUTEROL SULFATE HFA 108 (90 BASE) MCG/ACT IN AERS: 2.0000 | INHALATION_SPRAY | RESPIRATORY_TRACT | Status: DC | PRN

## 2021-02-02 MED ORDER — SODIUM CHLORIDE 0.9 % IV SOLN: 1.0000 g | Freq: Once | INTRAVENOUS | Status: DC

## 2021-02-02 MED ORDER — DM-GUAIFENESIN ER 30-600 MG PO TB12
1.0000 | ORAL_TABLET | Freq: Two times a day (BID) | ORAL | Status: DC | PRN
  Administered 2021-02-02 – 2021-02-09 (×5): 1 via ORAL
  Filled 2021-02-02 (×5): qty 1

## 2021-02-02 MED ORDER — ALBUTEROL SULFATE (2.5 MG/3ML) 0.083% IN NEBU
2.5000 mg | INHALATION_SOLUTION | RESPIRATORY_TRACT | Status: DC | PRN
  Administered 2021-02-04: 10:00:00 2.5 mg via RESPIRATORY_TRACT

## 2021-02-02 MED ORDER — LORAZEPAM 2 MG/ML IJ SOLN
0.5000 mg | Freq: Once | INTRAMUSCULAR | Status: AC
  Administered 2021-02-02: 11:00:00 0.5 mg via INTRAVENOUS
  Filled 2021-02-02: qty 1

## 2021-02-02 MED ORDER — VANCOMYCIN HCL IN DEXTROSE 1-5 GM/200ML-% IV SOLN
1000.0000 mg | Freq: Once | INTRAVENOUS | Status: AC
  Administered 2021-02-02: 11:00:00 1000 mg via INTRAVENOUS
  Filled 2021-02-02: qty 200

## 2021-02-02 MED ORDER — HYDRALAZINE HCL 20 MG/ML IJ SOLN: 5.0000 mg | INTRAMUSCULAR | Status: DC | PRN

## 2021-02-02 MED ORDER — LEVOTHYROXINE SODIUM 50 MCG PO TABS
50.0000 ug | ORAL_TABLET | Freq: Every day | ORAL | Status: DC
  Administered 2021-02-03 – 2021-02-10 (×8): 50 ug via ORAL
  Filled 2021-02-02 (×8): qty 1

## 2021-02-02 MED ORDER — ACETAMINOPHEN 325 MG PO TABS
650.0000 mg | ORAL_TABLET | Freq: Four times a day (QID) | ORAL | Status: DC | PRN
  Administered 2021-02-07 – 2021-02-09 (×7): 650 mg via ORAL
  Filled 2021-02-02 (×7): qty 2

## 2021-02-02 MED ORDER — ALBUTEROL SULFATE (2.5 MG/3ML) 0.083% IN NEBU
2.5000 mg | INHALATION_SOLUTION | RESPIRATORY_TRACT | Status: DC | PRN
  Administered 2021-02-02: 11:00:00 2.5 mg via RESPIRATORY_TRACT
  Filled 2021-02-02 (×2): qty 3

## 2021-02-02 NOTE — Consult Note (Signed)
PHARMACY -  BRIEF ANTIBIOTIC NOTE   Pharmacy has received consult(s) for Cefepime and Vancomycin from an ED provider.  The patient's profile has been reviewed for ht/wt/allergies/indication/available labs.    One time order(s) placed for Vancomycin 1gm and cefepime 2gm  Further antibiotics/pharmacy consults should be ordered by admitting physician if indicated.                       Thank you, Brigetta Beckstrom Rodriguez-Guzman PharmD, BCPS 02/02/2021 10:30 AM

## 2021-02-02 NOTE — ED Provider Notes (Signed)
[facility] Emergency Department Provider Note     Event Date/Time   First MD Initiated Contact with Patient 02/02/21 0935     (approximate)  I have reviewed the triage vital signs and the nursing notes.   HISTORY  Chief Complaint Shortness of Breath  History limited by shortness of breath  HPI BIONCA MCKEY is a 87 y.o. female patient was recently in the hospital for A. fib with RVR discharged on the second.  She thinks she may have caught pneumonia.  She became short of breath last night and is coughing up some material.  Patient is too short of breath to be able to tell me if she is having any color and the material she is coughing up.  She does not seem to be having any chest pain as near as I can tell from what she is able to tell me.  Here she has an axillary temperature of 99.3.        Past Medical History:  Diagnosis Date   Atrial fibrillation ([place])    Benign breast cyst in female, left 10/07/2016   Colon adenomas    GERD (gastroesophageal reflux disease)    Hypertension    Hypothyroidism    Macular degeneration    Mitral regurgitation    Pulmonary embolism ([place]) 02/2016   SVT (supraventricular tachycardia) ([place])     Patient Active Problem List   Diagnosis Date Noted   Iron deficiency anemia 01/25/2021   Symptomatic anemia 01/13/2021   History of embolic stroke 58/85/0277   Inflamed external hemorrhoid 12/08/2020   Insomnia due to anxiety and fear 12/08/2020   Long term (current) use of anticoagulants    Chronic HFrEF (heart failure with reduced ejection fraction) (HCC)    Left basal ganglia embolic stroke ([place]) 41/28/7867   Malnutrition of moderate degree ([place]) 11/03/2020   Prolonged QT interval    Acute on chronic systolic (congestive) heart failure (HCC)    Persistent atrial fibrillation ([place]) 10/30/2020   Antibiotic-associated diarrhea 10/15/2020   ILD (interstitial lung disease) ([place]) 09/22/2020   Atrial flutter,  paroxysmal ([place]) 09/10/2020   Tachyarrhythmia 09/08/2020   Sick sinus syndrome ([place]) 09/08/2020   Elevated serum free T4 level 09/08/2020   Purpura senilis ([place]) 08/28/2020   Hiatal hernia 08/09/2020   Atrial fibrillation with rapid ventricular response ([place]) 08/06/2020   Thrombophilia ([place]) 08/06/2020   Elevated troponin I level 05/21/2020   CVA (cerebral vascular accident) ([place]) 03/24/2020   History of COVID-19 03/03/2020   Bilateral carotid artery stenosis 12/23/2019   Schatzki's ring of distal esophagus 12/11/2019   Erosive gastritis 11/30/2019   Dysphagia 11/27/2019   Painful swallowing 11/27/2019   Gastroesophageal reflux disease 11/27/2019   Stage 3a chronic kidney disease ([place]) 04/11/2019   Coagulopathy ([place]) 09/12/2018   Essential hypertension 09/12/2018   CHF (congestive heart failure) ([place]) 08/15/2018   Hypothyroidism due to acquired atrophy of thyroid 05/08/2018   Unintentional weight loss 05/08/2018   Myalgia due to statin 05/08/2018   Abdominal aortic atherosclerosis ([place]) 05/07/2018   Bradycardia 04/28/2018   Pleural effusion on left 02/27/2018   Moderate mitral stenosis 01/10/2018   Lumbar radiculitis 01/07/2018   B12 deficiency 07/26/2017   Atrial fibrillation status post cardioversion [facility]) 04/06/2016   Hospital discharge follow-up 03/10/2016   Vitamin D deficiency 04/08/2015   Cervical spine degeneration 12/13/2014   History of pulmonary embolism 12/02/2014   Insomnia 10/04/2014   Moderate tricuspid insufficiency 09/17/2014   Generalized anxiety disorder 03/28/2013  Mitral valve prolapse 03/28/2013   Routine adult health maintenance 03/23/2012   Fatigue 03/23/2012   Cough 04/17/2011   Screening for breast cancer 11/29/2010   Macular degeneration, left eye 11/29/2010   Screening for colon cancer 11/29/2010   Hyperlipidemia LDL goal <70 11/29/2010   GERD (gastroesophageal reflux disease)     Past Surgical History:  Procedure Laterality Date    APPENDECTOMY  1960   BREAST CYST ASPIRATION Left 2017   CARDIOVERSION N/A 04/05/2016   Procedure: Cardioversion;  Surgeon: [name], MD;  Location: [facility] ORS;  Service: Cardiovascular;  Laterality: N/A;   CHOLECYSTECTOMY  1985   ESOPHAGOGASTRODUODENOSCOPY (EGD) WITH PROPOFOL N/A 11/27/2019   Procedure: ESOPHAGOGASTRODUODENOSCOPY (EGD) WITH PROPOFOL;  Surgeon: Lesly Rubenstein, MD;  Location: [facility] ENDOSCOPY;  Service: Endoscopy;  Laterality: N/A;   ESOPHAGOGASTRODUODENOSCOPY (EGD) WITH PROPOFOL N/A 12/24/2019   Procedure: ESOPHAGOGASTRODUODENOSCOPY (EGD) WITH PROPOFOL;  Surgeon: Lesly Rubenstein, MD;  Location: [facility] ENDOSCOPY;  Service: Endoscopy;  Laterality: N/A;   IR CT HEAD LTD  11/01/2020   IR PERCUTANEOUS ART THROMBECTOMY/INFUSION INTRACRANIAL INC DIAG ANGIO  11/01/2020   IR US GUIDE VASC ACCESS RIGHT  11/02/2020   OVARIAN CYST REMOVAL     RADIOLOGY WITH ANESTHESIA N/A 11/01/2020   Procedure: RADIOLOGY WITH ANESTHESIA;  Surgeon: Luanne Bras, MD;  Location: [facility];  Service: Radiology;  Laterality: N/A;   TEE WITHOUT CARDIOVERSION N/A 02/01/2018   Procedure: TRANSESOPHAGEAL ECHOCARDIOGRAM (TEE);  Surgeon: [name], MD;  Location: [facility] ORS;  Service: Cardiovascular;  Laterality: N/A;    Prior to Admission medications   Medication Sig Start Date End Date Taking? Authorizing Provider  acetaminophen (TYLENOL) 500 MG tablet Take 500 mg by mouth every 6 (six) hours as needed.    [provider]  ALPRAZolam Duanne Moron) 0.25 MG tablet Take 1 tablet (0.25 mg total) by mouth at bedtime as needed for anxiety. 12/07/20   [name], MD  amiodarone (PACERONE) 200 MG tablet Take 1/2 tablet (100 mg total) by mouth daily. 11/13/20   Love, Ivan Anchors, PA-C  ferrous sulfate 325 (65 FE) MG EC tablet Take 1 tablet (325 mg total) by mouth 2 (two) times daily. 01/15/21 07/14/21  Mercy Riding, MD  furosemide (LASIX) 20 MG tablet Take 2 tablets (40 mg total) by mouth daily. 01/15/21  04/15/21  Mercy Riding, MD  levothyroxine (EUTHYROX) 50 MCG tablet Take 1 tablet (50 mcg total) by mouth daily at 6 (six) AM. 11/13/20   Love, Ivan Anchors, PA-C  mirtazapine (REMERON) 7.5 MG tablet Take 1 tablet (7.5 mg total) by mouth at bedtime. 01/25/21   [name], MD  Multiple Vitamin (MULTIVITAMIN WITH MINERALS) TABS tablet Take 1 tablet by mouth daily. 11/13/20   Love, Ivan Anchors, PA-C  Rivaroxaban (XARELTO) 15 MG TABS tablet Take 1 tablet (15 mg total) by mouth daily with supper. 11/13/20   Love, Ivan Anchors, PA-C  senna-docusate (SENOKOT-S) 8.6-50 MG tablet Take 1 tablet by mouth 2 (two) times daily between meals as needed for mild constipation. 01/15/21   Mercy Riding, MD    Allergies Statins  Family History  Problem Relation Age of Onset   Cancer Mother 33       breast cancer, lived to 55,    Breast cancer Mother 55   Cancer Son 76       pancreatic cancer   Heart disease Son        CAD, Tobacco Abuse    Heart disease Maternal Grandmother  66       died of massive MI   Stroke Sister     Social History Social History   Tobacco Use   Smoking status: Never   Smokeless tobacco: Never   Tobacco comments:    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 , worked at [employer], Development worker, 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 used  Substance Use Topics   Alcohol use: No   Drug use: No    Review of Systems Unable to obtain due to shortness of breath  ____________________________________________   PHYSICAL EXAM:  VITAL SIGNS: ED Triage Vitals  Enc Vitals Group     BP      Pulse      Resp      Temp      Temp src      SpO2      Weight      Height      Head Circumference      Peak Flow      Pain Score      Pain Loc      Pain Edu?      Excl. in [place]?     Constitutional: Alert and oriented.  On CPAP by EMS is breathing hard even so. Eyes: Conjunctivae are normal.  Head: Atraumatic. Nose: No congestion/rhinnorhea. Mouth/Throat: Mucous membranes are moist.  Oropharynx non-erythematous. Neck: No  stridor.   Cardiovascular: Normal rate, regular rhythm. Grossly normal heart sounds.  Good peripheral circulation. Respiratory: Increased respiratory effort.   retractions. Lungs scattered crackles Gastrointestinal: Soft and nontender. No distention. No abdominal bruits.  Musculoskeletal: No lower extremity tenderness bilateral 1+ edema.   Neurologic: Appears to be at baseline Skin:  Skin is warm, dry and intact. No rash noted.   ____________________________________________   LABS (all labs ordered are listed, but only abnormal results are displayed)  Labs Reviewed  CBC WITH DIFFERENTIAL/PLATELET - Abnormal; Notable for the following components:      Result Value   WBC 10.8 (*)    RBC 3.77 (*)    Hemoglobin 10.1 (*)    HCT 32.1 (*)    RDW 22.9 (*)    All other components within normal limits  COMPREHENSIVE METABOLIC PANEL - Abnormal; Notable for the following components:   Sodium 133 (*)    CO2 18 (*)    Glucose, Bld 125 (*)    Creatinine, Ser 1.24 (*)    Total Bilirubin 2.8 (*)    GFR, Estimated 42 (*)    All other components within normal limits  BLOOD GAS, VENOUS - Abnormal; Notable for the following components:   pCO2, Ven 31 (*)    Bicarbonate 17.9 (*)    Acid-base deficit 6.4 (*)    All other components within normal limits  LACTIC ACID, PLASMA - Abnormal; Notable for the following components:   Lactic Acid, Venous 3.0 (*)    All other components within normal limits  CULTURE, BLOOD (SINGLE)  BRAIN NATRIURETIC PEPTIDE  LACTIC ACID, PLASMA  PROCALCITONIN   ____________________________________________  EKG  EKG read interpreted by me shows normal sinus rhythm rate of 92 right bundle branch block computer is also reading left anterior hemiblock.  There do not appear to be any new changes compared to EKG from November.  Much slower rate. ____________________________________________  RADIOLOGY Gertha Calkin, personally viewed and evaluated these images  (plain radiographs) as part of my medical decision making, as well as reviewing the written report by the radiologist.  ED MD interpretation: Chest  x-ray read by radiology reviewed by me shows a pneumonia and multifocal with possible signs of CHF as well per radiology.  I agree.  Official radiology report(s): DG Chest Portable 1 View  Result Date: 02/02/2021 CLINICAL DATA:  Shortness of breath in a female at age 5. EXAM: PORTABLE CHEST 1 VIEW COMPARISON:  January 12, 2021. FINDINGS: EKG leads project over the chest. Trachea midline. Heart size is enlarged as before. Graded opacity suggested at the RIGHT lung base. Marked increased density about the RIGHT mid chest compared to the previous exam and scattered multifocal opacities including RIGHT lower lobe in the RIGHT retrocardiac region. Mild increased interstitial prominence in the LEFT chest. No visible pneumothorax. On limited assessment there is no acute skeletal process. IMPRESSION: Findings suspicious for multifocal pneumonia in the RIGHT chest potentially associated with RIGHT-sided effusion. Cardiomegaly with signs of vascular congestion. Also correlate with any signs of heart failure. Electronically Signed   By: Zetta Bills M.D.   On: 02/02/2021 10:03    ____________________________________________   PROCEDURES  Procedure(s) performed (including Critical Care): Critical care time 20 minutes.  This includes reviewing patient's old records and lab work as it is coming back.  Also evaluated the patient twice.  Speaking to the hospitalist.  Procedures   ____________________________________________   INITIAL IMPRESSION / ASSESSMENT AND PLAN / ED COURSE Old record review shows patient had recent CVA recent revascularization does take Xarelto does have history of congestive heart failure has severe mitral valve regurg she had PEs hypertension hypothyroidism etc.  ----------------------------------------- 10:21 AM on  02/02/2021 ----------------------------------------- Patient with low-grade fever at this time lactic acid is elevated chest x-ray consistent with pneumonia mostly.  White count is minimally elevated differential is pending.  Procalcitonin is pending.  I have ordered sepsis profile/order set as patient does appear to be septic with pneumonia.  We will get Maxipime going.  Plan on admitting her to the hospital.          ____________________________________________   FINAL CLINICAL IMPRESSION(S) / ED DIAGNOSES  Final diagnoses:  Shortness of breath  HCAP (healthcare-associated pneumonia)  Sepsis with acute organ dysfunction and septic shock, due to unspecified organism, unspecified type [facility])     ED Discharge Orders     None        Note:  This document was prepared using Dragon voice recognition software and may include unintentional dictation errors.    Nena Polio, MD 02/02/21 1024

## 2021-02-02 NOTE — ED Triage Notes (Signed)
Pt ems from home for SOB. Per ems pts sob started last night. Pt arrives here on cpap. Ems gave solumedrol 125mg .

## 2021-02-02 NOTE — Consult Note (Signed)
CODE SEPSIS - PHARMACY COMMUNICATION  **Broad Spectrum Antibiotics should be administered within 1 hour of Sepsis diagnosis**  Time Code Sepsis Called/Page Received: 1028  Antibiotics Ordered: Cefepime and Vancomycin  Time of 1st antibiotic administration: 1102  Additional action taken by pharmacy: none  If necessary, Name of Provider/Nurse Contacted: n/a   Vishaal Strollo Rodriguez-Guzman PharmD, BCPS 02/02/2021 10:31 AM

## 2021-02-02 NOTE — Consult Note (Signed)
Pharmacy Antibiotic Note  Frances Kent is a 87 y.o. female with PMH of paroxysmal atrial fibrillation, GERD, hypertension, hypothyroidism, CHF, PE and SVT who was admitted on 02/02/2021 with pneumonia.  Pharmacy has been consulted for cefepime and vancomycin dosing.  Tmax 100.3, WBC 10.8, procal 3.17  Bcx, MRSA PCR, and resp cult sent  Plan: Vancomycin  --Pt given vancomycin 1000mg  loading dose x1 in Ed --Will start vancomycin 1000mg  IV q48h --Est AUC: 518.3, Css min 11.2 --Scr used: 1.24, TBW used, VD: 0.72 --Will obtain vancomycin levels around 4th or 5th dose if continued   Cefepime --Will start cefepime 2g IV q24h  Will continue to monitor renal function and cultures and adjust regimen as clinically indicated   Height: 5\' 3"  (160 cm) Weight: 52 kg (114 lb 10.2 oz) IBW/kg (Calculated) : 52.4  Temp (24hrs), Avg:100.3 F (37.9 C), Min:100.3 F (37.9 C), Max:100.3 F (37.9 C)  Recent Labs  Lab 02/02/21 0926 02/02/21 0927 02/02/21 1139  WBC  --  10.8*  --   CREATININE  --  1.24*  --   LATICACIDVEN 3.0*  --  1.8    Estimated Creatinine Clearance: 25.7 mL/min (A) (by C-G formula based on SCr of 1.24 mg/dL (H)).    Allergies  Allergen Reactions   Statins     Severe myalgias    Antimicrobials this admission: Vancomycin 12/20 >>  Cefepime 12/20 >>   Microbiology results: 12/20 BCx: sent 12/20 Resp cult: sent 12/20 MRSA PCR: sent  Thank you for allowing pharmacy to be a part of this patients care.  Narda Rutherford, PharmD Pharmacy Resident  02/02/2021 4:29 PM

## 2021-02-02 NOTE — Plan of Care (Signed)

## 2021-02-02 NOTE — Progress Notes (Signed)
Patient wearing continuous bipap, unable to complete admission at this time. Here for alerted mental status, spouce did not come up with patient from the ER

## 2021-02-02 NOTE — Sepsis Progress Note (Signed)
Code sepsis protocol being followed by eLink 

## 2021-02-02 NOTE — H&P (Signed)
History and Physical    Frances Kent DOB: April 26, 1930 DOA: 02/02/2021  Referring MD/NP/PA:   PCP: [name], MD   Patient coming from:  The patient is coming from home.   Chief Complaint: SOB  HPI: Frances Kent is a 87 y.o. female with medical history significant of hypertension, hyperlipidemia, stroke, GERD, hypothyroidism, depression with anxiety, interstitial lung disease, anemia, SSS, SVT, PE and atrial fibrillation on Xarelto, mitral valve prolapse, CHF with EF of 40-45%, bilateral carotid artery stenosis, who presents with shortness of breath.  Per her daughter, patient's shortness breath started last night, which has been progressively worsening.  Patient has cough with little mucus production.  No chest pain.  Patient has fever and chills.  Per report, patient had oxygen desaturating to 80s, arrival on CPAP by EMS.  Patient has severe respiratory distress, using accessory muscle for breathing.  BiPAP is started in the ED.  Patient does not have nausea, vomiting, diarrhea or abdominal pain.  No symptoms of UTI.  ED Course: pt was found to have negative COVID test, BMP 798, lactic acid 3.0, WBC 10.8, stable renal function, temperature 100.3, blood pressure 168/78, heart rate 83, RR 26, chest x-ray showed multifocal infiltration, cardiomegaly, vascular congestion and right pleural effusion.  Patient is admitted to progressive bed as inpatient   Review of Systems:   General: has fevers, chills, no body weight gain, has fatigue HEENT: no blurry vision, hearing changes or sore throat Respiratory: has dyspnea, coughing, no wheezing CV: no chest pain, no palpitations GI: no nausea, vomiting, abdominal pain, diarrhea, constipation GU: no dysuria, burning on urination, increased urinary frequency, hematuria  Ext: trace leg edema Neuro: no unilateral weakness, numbness, or tingling, no vision change or hearing loss Skin: no rash, no skin tear. MSK: No muscle  spasm, no deformity, no limitation of range of movement in spin Heme: No easy bruising.  Travel history: No recent long distant travel.  Allergy:  Allergies  Allergen Reactions   Statins     Severe myalgias    Past Medical History:  Diagnosis Date   Atrial fibrillation ([place])    Benign breast cyst in female, left 10/07/2016   Colon adenomas    GERD (gastroesophageal reflux disease)    Hypertension    Hypothyroidism    Macular degeneration    Mitral regurgitation    Pulmonary embolism ([place]) 02/2016   SVT (supraventricular tachycardia) ([place])     Past Surgical History:  Procedure Laterality Date   APPENDECTOMY  1960   BREAST CYST ASPIRATION Left 2017   CARDIOVERSION N/A 04/05/2016   Procedure: Cardioversion;  Surgeon: [name], MD;  Location: [facility] ORS;  Service: Cardiovascular;  Laterality: N/A;   CHOLECYSTECTOMY  1985   ESOPHAGOGASTRODUODENOSCOPY (EGD) WITH PROPOFOL N/A 11/27/2019   Procedure: ESOPHAGOGASTRODUODENOSCOPY (EGD) WITH PROPOFOL;  Surgeon: Lesly Rubenstein, MD;  Location: [facility] END[facility];  Service: Endoscopy;  Laterality: N/A;   ESOPHAGOGASTRODUODENOSCOPY (EGD) WITH PROPOFOL N/A 12/24/2019   Procedure: ESOPHAGOGASTRODUODENOSCOPY (EGD) WITH PROPOFOL;  Surgeon: Lesly Rubenstein, MD;  Location: [facility] END[facility]COPY;  Service: Endoscopy;  Laterality: N/A;   IR CT HEAD LTD  11/01/2020   IR PERCUTANEOUS ART THROMBECTOMY/INFUSION INTRACRANIAL INC DIAG ANGIO  11/01/2020   IR US GUIDE VASC ACCESS RIGHT  11/02/2020   OVARIAN CYST REMOVAL     RADIOLOGY WITH ANESTHESIA N/A 11/01/2020   Procedure: RADIOLOGY WITH ANESTHESIA;  Surgeon: Luanne Bras, MD;  Location: [place];  Service: Radiology;  Laterality: N/A;   TEE  WITHOUT CARDIOVERSION N/A 02/01/2018   Procedure: TRANSESOPHAGEAL ECHOCARDIOGRAM (TEE);  Surgeon: [name], MD;  Location: [facility] ORS;  Service: Cardiovascular;  Laterality: N/A;    Social History:  reports that she has never smoked. She has never used  smokeless tobacco. She reports that she does not drink alcohol and does not use drugs.  Family History:  Family History  Problem Relation Age of Onset   Cancer Mother 57       breast cancer, lived to 3,    Breast cancer Mother 81   Cancer Son 32       pancreatic cancer   Heart disease Son        CAD, Tobacco Abuse    Heart disease Maternal Grandmother 80       died of massive MI   Stroke Sister      Prior to Admission medications   Medication Sig Start Date End Date Taking? Authorizing Provider  acetaminophen (TYLENOL) 500 MG tablet Take 500 mg by mouth every 6 (six) hours as needed.   Yes [provider]  ALPRAZolam (XANAX) 0.25 MG tablet Take 1 tablet (0.25 mg total) by mouth at bedtime as needed for anxiety. 12/07/20  Yes [name], MD  amiodarone (PACERONE) 200 MG tablet Take 1/2 tablet (100 mg total) by mouth daily. Patient taking differently: Take 100 mg by mouth 2 (two) times daily. 11/13/20  Yes Love, Ivan Anchors, PA-C  ferrous sulfate 325 (65 FE) MG EC tablet Take 1 tablet (325 mg total) by mouth 2 (two) times daily. 01/15/21 07/14/21 Yes Mercy Riding, MD  furosemide (LASIX) 20 MG tablet Take 2 tablets (40 mg total) by mouth daily. 01/15/21 04/15/21 Yes Mercy Riding, MD  levothyroxine (EUTHYROX) 50 MCG tablet Take 1 tablet (50 mcg total) by mouth daily at 6 (six) AM. 11/13/20  Yes Love, Ivan Anchors, PA-C  mirtazapine (REMERON) 7.5 MG tablet Take 1 tablet (7.5 mg total) by mouth at bedtime. 01/25/21  Yes [name], MD  Multiple Vitamin (MULTIVITAMIN WITH MINERALS) TABS tablet Take 1 tablet by mouth daily. 11/13/20  Yes Love, Ivan Anchors, PA-C  Rivaroxaban (XARELTO) 15 MG TABS tablet Take 1 tablet (15 mg total) by mouth daily with supper. 11/13/20  Yes Love, Ivan Anchors, PA-C  senna-docusate (SENOKOT-S) 8.6-50 MG tablet Take 1 tablet by mouth 2 (two) times daily between meals as needed for mild constipation. 01/15/21  Yes Mercy Riding, MD    Physical Exam: Vitals:    02/02/21 1400 02/02/21 1500 02/02/21 1600 02/02/21 1727  BP: (!) 146/62 122/61 (!) 142/62 123/65  Pulse: 68 69 69 71  Resp: (!) 21 (!) 25 (!) 29 20  Temp:    98.7 F (37.1 C)  TempSrc:      SpO2: 100% 100% 100% 100%  Weight:      Height:       General: Not in acute distress HEENT:       Eyes: PERRL, EOMI, no scleral icterus.       ENT: No discharge from the ears and nose, no pharynx injection, no tonsillar enlargement.        Neck: No JVD, no bruit, no mass felt. Heme: No neck lymph node enlargement. Cardiac: S1/S2, RRR, No murmurs, No gallops or rubs. Respiratory: has fine crackles bilaterally GI: Soft, nondistended, nontender, no rebound pain, no organomegaly, BS present. GU: No hematuria Ext: has trace pitting leg edema bilaterally. 1+DP/PT pulse bilaterally. Musculoskeletal: No joint deformities, No joint redness  or warmth, no limitation of ROM in spin. Skin: No rashes.  Neuro: Alert, oriented X3, cranial nerves II-XII grossly intact, moves all extremities normally.  Psych: Patient is not psychotic, no suicidal or hemocidal ideation.  Labs on Admission: I have personally reviewed following labs and imaging studies  CBC: Recent Labs  Lab 02/02/21 0927  WBC 10.8*  NEUTROABS 9.3*  HGB 10.1*  HCT 32.1*  MCV 85.1  PLT 412   Basic Metabolic Panel: Recent Labs  Lab 02/02/21 0927  NA 133*  K 4.3  CL 105  CO2 18*  GLUCOSE 125*  BUN 20  CREATININE 1.24*  CALCIUM 9.1   GFR: Estimated Creatinine Clearance: 25.7 mL/min (A) (by C-G formula based on SCr of 1.24 mg/dL (H)). Liver Function Tests: Recent Labs  Lab 02/02/21 0927  AST 36  ALT 31  ALKPHOS 102  BILITOT 2.8*  PROT 7.0  ALBUMIN 3.5   No results for input(s): LIPASE, AMYLASE in the last 168 hours. No results for input(s): AMMONIA in the last 168 hours. Coagulation Profile: No results for input(s): INR, PROTIME in the last 168 hours. Cardiac Enzymes: No results for input(s): CKTOTAL, CKMB, CKMBINDEX,  TROPONINI in the last 168 hours. BNP (last 3 results) No results for input(s): PROBNP in the last 8760 hours. HbA1C: No results for input(s): HGBA1C in the last 72 hours. CBG: No results for input(s): GLUCAP in the last 168 hours. Lipid Profile: No results for input(s): CHOL, HDL, LDLCALC, TRIG, CHOLHDL, LDLDIRECT in the last 72 hours. Thyroid Function Tests: No results for input(s): TSH, T4TOTAL, FREET4, T3FREE, THYROIDAB in the last 72 hours. Anemia Panel: No results for input(s): VITAMINB12, FOLATE, FERRITIN, TIBC, IRON, RETICCTPCT in the last 72 hours. Urine analysis:    Component Value Date/Time   COLORURINE YELLOW (A) 09/08/2020 0105   APPEARANCEUR HAZY (A) 09/08/2020 0105   APPEARANCEUR Clear 02/05/2013 1630   LABSPEC 1.024 09/08/2020 0105   LABSPEC 1.009 02/05/2013 1630   PHURINE 6.0 09/08/2020 0105   GLUCOSEU NEGATIVE 09/08/2020 0105   GLUCOSEU NEGATIVE 04/04/2018 1149   HGBUR NEGATIVE 09/08/2020 0105   BILIRUBINUR NEGATIVE 09/08/2020 0105   BILIRUBINUR 1 08/31/2017 1157   BILIRUBINUR Negative 02/05/2013 1630   KETONESUR 5 (A) 09/08/2020 0105   PROTEINUR 100 (A) 09/08/2020 0105   UROBILINOGEN 1.0 04/04/2018 1149   NITRITE NEGATIVE 09/08/2020 0105   LEUKOCYTESUR NEGATIVE 09/08/2020 0105   LEUKOCYTESUR 1+ 02/05/2013 1630   Sepsis Labs: @LABRCNTIP (procalcitonin:4,lacticidven:4) ) Recent Results (from the past 240 hour(s))  Resp Panel by RT-PCR (Flu A&B, Covid) Nasopharyngeal Swab     Status: None   Collection Time: 02/02/21  9:27 AM   Specimen: Nasopharyngeal Swab; Nasopharyngeal(NP) swabs in vial transport medium  Result Value Ref Range Status   SARS Coronavirus 2 by RT PCR NEGATIVE NEGATIVE Final    Comment: (NOTE) SARS-CoV-2 target nucleic acids are NOT DETECTED.  The SARS-CoV-2 RNA is generally detectable in upper respiratory specimens during the acute phase of infection. The lowest concentration of SARS-CoV-2 viral copies this assay can detect is 138  copies/mL. A negative result does not preclude SARS-Cov-2 infection and should not be used as the sole basis for treatment or other patient management decisions. A negative result may occur with  improper specimen collection/handling, submission of specimen other than nasopharyngeal swab, presence of viral mutation(s) within the areas targeted by this assay, and inadequate number of viral copies(<138 copies/mL). A negative result must be combined with clinical observations, patient history, and epidemiological information. The expected result  is Negative.  Fact Sheet for Patients:  EntrepreneurPulse.com.au  Fact Sheet for Healthcare Providers:  IncredibleEmployment.be  This test is no t yet approved or cleared by the [place] FDA and  has been authorized for detection and/or diagnosis of SARS-CoV-2 by FDA under an Emergency Use Authorization (EUA). This EUA will remain  in effect (meaning this test can be used) for the duration of the COVID-19 declaration under Section 564(b)(1) of the Act, 21 U.S.C.section 360bbb-3(b)(1), unless the authorization is terminated  or revoked sooner.       Influenza A by PCR NEGATIVE NEGATIVE Final   Influenza B by PCR NEGATIVE NEGATIVE Final    Comment: (NOTE) The Xpert Xpress SARS-CoV-2/FLU/RSV plus assay is intended as an aid in the diagnosis of influenza from Nasopharyngeal swab specimens and should not be used as a sole basis for treatment. Nasal washings and aspirates are unacceptable for Xpert Xpress SARS-CoV-2/FLU/RSV testing.  Fact Sheet for Patients: EntrepreneurPulse.com.au  Fact Sheet for Healthcare Providers: IncredibleEmployment.be  This test is not yet approved or cleared by the [place] FDA and has been authorized for detection and/or diagnosis of SARS-CoV-2 by FDA under an Emergency Use Authorization (EUA). This EUA will remain in effect (meaning  this test can be used) for the duration of the COVID-19 declaration under Section 564(b)(1) of the Act, 21 U.S.C. section 360bbb-3(b)(1), unless the authorization is terminated or revoked.  Performed at [facility], [address], [place], [place] [address]      Radiological Exams on Admission: DG Chest Portable 1 View  Result Date: 02/02/2021 CLINICAL DATA:  Shortness of breath in a female at age 41. EXAM: PORTABLE CHEST 1 VIEW COMPARISON:  January 12, 2021. FINDINGS: EKG leads project over the chest. Trachea midline. Heart size is enlarged as before. Graded opacity suggested at the RIGHT lung base. Marked increased density about the RIGHT mid chest compared to the previous exam and scattered multifocal opacities including RIGHT lower lobe in the RIGHT retrocardiac region. Mild increased interstitial prominence in the LEFT chest. No visible pneumothorax. On limited assessment there is no acute skeletal process. IMPRESSION: Findings suspicious for multifocal pneumonia in the RIGHT chest potentially associated with RIGHT-sided effusion. Cardiomegaly with signs of vascular congestion. Also correlate with any signs of heart failure. Electronically Signed   By: Zetta Bills M.D.   On: 02/02/2021 10:03     EKG: I have personally reviewed.  Sinus rhythm, QTC 474, bifascicular block, poor R wave progression    Assessment/Plan Principal Problem:   HCAP (healthcare-associated pneumonia) Active Problems:   History of pulmonary embolism   Atrial fibrillation status post cardioversion [facility])   Hypothyroidism due to acquired atrophy of thyroid   Chronic systolic CHF (congestive heart failure) (HCC)   Essential hypertension   Stage 3a chronic kidney disease (HCC)   Acute respiratory failure with hypoxia (HCC)   ILD (interstitial lung disease) ([place])   CVA (cerebral vascular accident) ([place])   Iron deficiency anemia   Severe sepsis ([place])   Depression with anxiety   Acute  respiratory failure with hypoxia and severe sepsis due to HCAP (healthcare-associated pneumonia): Patient meets criteria for severe sepsis with RR 26 and fever.  Lactic acid is elevated 3.0.  Currently hemodynamically stable.  -Admited to progressive unit as inpatient - BiPAP - IV Vancomycin and cefepime - Mucinex for cough  - Bronchodilators - Urine legionella and S. pneumococcal antigen - Follow up blood culture x2, sputum culture - will get Procalcitonin and trend lactic acid  level per sepsis protocol - IVF: will not give IVF since pt has EF 40-45 and elevated BNP 798  History of pulmonary embolism -xarelto  Atrial fibrillation status post cardioversion (HCC) -Xarelto -Amiodarone  Hypothyroidism due to acquired atrophy of thyroid -Synthroid  Chronic systolic CHF (congestive heart failure) ([place]): 2D echo on 11/01/2020 showed EF of 40-45%.  Patient has elevated BNP 798, indicating some fluid overload.  Due to severe sepsis, will hold Lasix -Hold Lasix until sepsis resolves  Essential hypertension -Hold Lasix -IV hydralazine as needed  Stage 3a chronic kidney disease (HCC) -Stable -Follow-up by BMP  ILD (interstitial lung disease) (HCC) -Bronchodilators  CVA (cerebral vascular accident) ([place]) -Patient is on Xarelto for atrial fibrillation for stroke prophylaxis  Iron deficiency anemia: Hemoglobin stable, 10.1 -Continue iron supplement  Depression with anxiety -Continue home medications      DVT ppx: on Xarelto Code Status: Partial code (I discussed with the patient in presence of her daughter, and explained the meaning of CODE STATUS, patient wants to be partial code, OK for CPR, but no intubation). Family Communication:  Yes, patient's daughter   at bed side.   Disposition Plan:  Anticipate discharge back to previous environment Consults called:  none Admission status and Level of care: Progressive:  as inpt      Status is: Inpatient  Remains inpatient  appropriate because: Patient has multiple comorbidities, now presents with HCAP and severe sepsis, lactic acid is elevated up to 3.  Patient has history of systolic CHF, cannot give aggressive IV fluid resuscitation.  Her presentation is highly complicated.  Patient is at high risk of deteriorating given her older age.  Will need to be treated in hospital for at least 2 days.          Date of Service 02/02/2021    Ivor Costa [facility]   If 7PM-7AM, please contact night-coverage [email or web address] 02/02/2021, 7:11 PM

## 2021-02-02 NOTE — Sepsis Progress Note (Signed)
Lactic acid 3.0 -> 1.8.  Fluid resuscitation initially ordered was cancelled. No hypotension. Short of breath and placed on BIPAP. BNP 798. CXR noted to include possible signs CHF.  H/o congestive heart failure and severe mitral valve regurg.

## 2021-02-03 LAB — BASIC METABOLIC PANEL
Anion gap: 7 (ref 5–15)
BUN: 25 mg/dL — ABNORMAL HIGH (ref 8–23)
CO2: 20 mmol/L — ABNORMAL LOW (ref 22–32)
Calcium: 8.8 mg/dL — ABNORMAL LOW (ref 8.9–10.3)
Chloride: 109 mmol/L (ref 98–111)
Creatinine, Ser: 0.93 mg/dL (ref 0.44–1.00)
GFR, Estimated: 59 mL/min — ABNORMAL LOW (ref >60)
Glucose, Bld: 146 mg/dL — ABNORMAL HIGH (ref 70–99)
Potassium: 5.1 mmol/L (ref 3.5–5.1)
Sodium: 136 mmol/L (ref 135–145)

## 2021-02-03 LAB — CBC
HCT: 31 % — ABNORMAL LOW (ref 36.0–46.0)
Hemoglobin: 9.5 g/dL — ABNORMAL LOW (ref 12.0–15.0)
MCH: 25.7 pg — ABNORMAL LOW (ref 26.0–34.0)
MCHC: 30.6 g/dL (ref 30.0–36.0)
MCV: 84 fL (ref 80.0–100.0)
Platelets: 227 10*3/uL (ref 150–400)
RBC: 3.69 MIL/uL — ABNORMAL LOW (ref 3.87–5.11)
RDW: 22.5 % — ABNORMAL HIGH (ref 11.5–15.5)
WBC: 8.3 10*3/uL (ref 4.0–10.5)
nRBC: 0 % (ref 0.0–0.2)

## 2021-02-03 LAB — MRSA NEXT GEN BY PCR, NASAL: MRSA by PCR Next Gen: NOT DETECTED

## 2021-02-03 MED ORDER — SODIUM CHLORIDE 0.9 % IV SOLN
2.0000 g | INTRAVENOUS | Status: DC
  Administered 2021-02-03: 21:00:00 2 g via INTRAVENOUS
  Filled 2021-02-03 (×2): qty 20

## 2021-02-03 MED ORDER — FUROSEMIDE 40 MG PO TABS
40.0000 mg | ORAL_TABLET | Freq: Every day | ORAL | Status: DC
  Administered 2021-02-04: 09:00:00 40 mg via ORAL
  Filled 2021-02-03: qty 1

## 2021-02-03 MED ORDER — AZITHROMYCIN 250 MG PO TABS
500.0000 mg | ORAL_TABLET | Freq: Every day | ORAL | Status: AC
  Administered 2021-02-03 – 2021-02-05 (×3): 500 mg via ORAL
  Filled 2021-02-03 (×3): qty 2

## 2021-02-03 MED ORDER — HYDRALAZINE HCL 25 MG PO TABS: 25.0000 mg | ORAL_TABLET | Freq: Three times a day (TID) | ORAL | Status: DC | PRN

## 2021-02-03 MED ORDER — GUAIFENESIN ER 600 MG PO TB12
600.0000 mg | ORAL_TABLET | Freq: Two times a day (BID) | ORAL | Status: DC
  Administered 2021-02-03 – 2021-02-10 (×15): 600 mg via ORAL
  Filled 2021-02-03 (×15): qty 1

## 2021-02-03 MED ORDER — CEFEPIME HCL 2 G IJ SOLR
2.0000 g | Freq: Two times a day (BID) | INTRAMUSCULAR | Status: DC
Start: 2021-02-03 — End: 2021-02-03
  Administered 2021-02-03: 10:00:00 2 g via INTRAVENOUS
  Filled 2021-02-03 (×2): qty 2

## 2021-02-03 NOTE — Progress Notes (Signed)
PROGRESS NOTE    Frances Kent  DGU:[id number] DOB: Apr 15, 1930 DOA: 02/02/2021 PCP: [name], MD    Brief Narrative:  Frances Kent is an 87 year old female with past medical history significant for essential hypertension, hyperlipidemia, CVA, GERD, hypothyroidism, depression/anxiety, interstitial lung disease, anemia, SSS, SVT, history of pulmonary embolism, proximal atrial fibrillation on Xarelto, mitral valve prolapse, chronic systolic/diastolic congestive heart failure, bilateral carotid artery stenosis who presents to [facility] ED via EMS on 12/20 with progressive shortness of breath.  Patient also reports productive cough with associated fever/chills.  Denies chest pain, no nausea/vomiting/diarrhea, no abdominal pain, no symptoms of urinary tract infection.  On EMS arrival, patient was noted to be desaturating to the 80s with severe respiratory distress, accessory muscle use and placed on CPAP; and subsequently transported to the ED for further evaluation.  In the ED, temperature 100.3 F, HR 83, RR 26, BP 168/78, SPO2 100% on BiPAP with FiO2 60%.  VBG with pH 7.37, PCO2 31, PO2 32.0.  Sodium 133, potassium 4.3, chloride 105, CO2 18, glucose 125, BUN 20, creatinine 1.24.  Total bilirubin 2.8.  BNP elevated 798.7.  Lactic acid 3.0.  WBC 10.8, hemoglobin 10.1, platelets 259.  Procalcitonin 3.17.  COVID-19 PCR negative.  Influenza A/B PCR negative.  Blood cultures x2 ordered.  Chest x-ray with multifocal pneumonia right chest with right-sided effusion, cardiomegaly with vascular congestion.  Patient was started on empiric antibiotics by EDP.  Hospital service consulted for further evaluation management of acute respite failure secondary to pneumonia.    Assessment & Plan:   Principal Problem:   HCAP (healthcare-associated pneumonia) Active Problems:   History of pulmonary embolism   Atrial fibrillation status post cardioversion [facility])   Hypothyroidism due to acquired atrophy of  thyroid   Chronic systolic CHF (congestive heart failure) (HCC)   Essential hypertension   Stage 3a chronic kidney disease (HCC)   Acute respiratory failure with hypoxia (HCC)   ILD (interstitial lung disease) (HCC)   CVA (cerebral vascular accident) ([place])   Iron deficiency anemia   Severe sepsis ([place])   Depression with anxiety   Sepsis, POA Lactic acidosis: Resolved Community acquired pneumonia, suspect atypical versus gram-negative organism Acute respiratory failure with hypoxia, POA Patient presenting to the ED with progressive shortness of breath, fever/chills and found to be hypoxic on EMS arrival with SPO2 in the 80s.  Patient was confused with associated metabolic encephalopathy and lactic acid elevated at 3.0 likely secondary to commune acquired pneumonia suspicious for atypical versus gram-negative organism.  MRSA PCR negative.  Initially requiring BiPAP to maintain oxygenation. --lactic acid 3.0>1.8 --WBC 10.8>8.3 --Procalcitonin 3.17 --Blood cultures x2: Pending --Strep pneumonia/Legionella urinary antigen: Pending --Azithromycin 500 mg p.o. daily --Ceftriaxone 2 g IV every 24 hours --Mucinex twice daily --Incentive spirometry --Flutter valve --Continue supplemental oxygen, maintain SPO2 greater than 92%, BiPAP titrated off now to nasal cannula at 6 L  Acute metabolic encephalopathy, POA Upon presentation, patient was noted to be confused.  Etiology likely secondary to sepsis from underlying pneumonia with hypoxia.  Patient was started on BiPAP with improvement of SPO2 and empiric antibiotics with resolution of encephalopathy. --Continue supportive care  Hyperlipidemia --Not on statin outpatient  Hypothyroidism --Levothyroxine 50 mcg p.o. daily  Depression/anxiety --Alprazolam 0.25 mg qHS PRN  Hx Interstitial lung disease --Albuterol neb as needed --Supplemental oxygen as above --Supportive care --Outpatient follow-up with pulmonology  Anemia --Ferrous  sulfate 325 mg p.o. twice daily  Hx pulmonary embolism Paroxysmal atrial fibrillation  --Amiodarone 100 mg  p.o. twice daily --Xarelto 15 mg p.o. daily  Essential hypertension Chronic systolic/diastolic congestive heart failure On furosemide 40 mg p.o. daily at home.  Follows with cardiology outpatient, Dr. [name]. On presentation, admitted with sepsis as above with chest x-ray with pulmonary vascular congestion and elevated BNP of 798.7.  TTE 11/01/2020 with LVEF 40-45%, LV mildly decreased function, no LV regional wall motion abnormalities, LA/RA moderately dilated, moderate/severe MR, moderate TR, no aortic stenosis, IVC normal in size. --Resume furosemide 40 mg p.o. daily --Strict I's and O's and daily weights  Bilateral carotid artery stenosis Outpatient follow-up with cardiology/vascular surgery  Weakness/deconditioning/debility: --PT recommending home health --OT consult: Pending   DVT prophylaxis:  Rivaroxaban (XARELTO) tablet 15 mg    Code Status: Partial Code Family Communication: Updated daughter present at bedside this morning  Disposition Plan:  Level of care: Progressive Status is: Inpatient  Remains inpatient appropriate because: Transitioning off of BiPAP this morning to nasal cannula.  Continues on IV antibiotics, pending therapy evaluation.    Consultants:  None  Procedures:  BiPAP  Antimicrobials:  Vancomycin 12/20 - 12/21 Cefepime 12/20 - 12/21 Azithromycin 12/21>> Ceftriaxone 12/21>>   Subjective: Patient seen examined at bedside, resting comfortably.  Transitioned off of BiPAP this morning to high flow nasal cannula.  States breathing is slightly improved.  Continues with productive cough.  No other specific complaints at this time.  Denies headache, no dizziness, no chest pain, no abdominal pain, no fever/chills/night sweats, no nausea/vomiting/diarrhea, no congestion, no paresthesias.  No acute events overnight per nursing  staff.  Objective: Vitals:   02/03/21 0740 02/03/21 0754 02/03/21 1123 02/03/21 1526  BP:  (!) 145/67 130/70 (!) 147/69  Pulse:  73 97 73  Resp:  18 18 18   Temp:  98 F (36.7 C) 99.4 F (37.4 C) 98.6 F (37 C)  TempSrc:   Oral Oral  SpO2: 99% 100% (!) 86% 100%  Weight:      Height:        Intake/Output Summary (Last 24 hours) at 02/03/2021 1544 Last data filed at 02/03/2021 1500 Gross per 24 hour  Intake 1020 ml  Output 950 ml  Net 70 ml   Filed Weights   02/02/21 0954 02/03/21 0410 02/03/21 0414  Weight: 52 kg 52.4 kg 52.4 kg    Examination:  General exam: Appears calm and comfortable, ill in appearance Respiratory system: Decreased breath sounds bilateral bases with mild crackles, normal Respaire effort, on nasal cannula Cardiovascular system: S1 & S2 heard, RRR. No JVD, murmurs, rubs, gallops or clicks. No pedal edema. Gastrointestinal system: Abdomen is nondistended, soft and nontender. No organomegaly or masses felt. Normal bowel sounds heard. Central nervous system: Alert and oriented. No focal neurological deficits. Extremities: Symmetric 5 x 5 power. Skin: No rashes, lesions or ulcers Psychiatry: Judgement and insight appear normal. Mood & affect appropriate.     Data Reviewed: I have personally reviewed following labs and imaging studies  CBC: Recent Labs  Lab 02/02/21 0927 02/03/21 0709  WBC 10.8* 8.3  NEUTROABS 9.3*  --   HGB 10.1* 9.5*  HCT 32.1* 31.0*  MCV 85.1 84.0  PLT 259 270   Basic Metabolic Panel: Recent Labs  Lab 02/02/21 0927 02/03/21 0709  NA 133* 136  K 4.3 5.1  CL 105 109  CO2 18* 20*  GLUCOSE 125* 146*  BUN 20 25*  CREATININE 1.24* 0.93  CALCIUM 9.1 8.8*   GFR: Estimated Creatinine Clearance: 34.6 mL/min (by C-G formula based on SCr of 0.93  mg/dL). Liver Function Tests: Recent Labs  Lab 02/02/21 0927  AST 36  ALT 31  ALKPHOS 102  BILITOT 2.8*  PROT 7.0  ALBUMIN 3.5   No results for input(s): LIPASE, AMYLASE  in the last 168 hours. No results for input(s): AMMONIA in the last 168 hours. Coagulation Profile: No results for input(s): INR, PROTIME in the last 168 hours. Cardiac Enzymes: No results for input(s): CKTOTAL, CKMB, CKMBINDEX, TROPONINI in the last 168 hours. BNP (last 3 results) No results for input(s): PROBNP in the last 8760 hours. HbA1C: No results for input(s): HGBA1C in the last 72 hours. CBG: No results for input(s): GLUCAP in the last 168 hours. Lipid Profile: No results for input(s): CHOL, HDL, LDLCALC, TRIG, CHOLHDL, LDLDIRECT in the last 72 hours. Thyroid Function Tests: No results for input(s): TSH, T4TOTAL, FREET4, T3FREE, THYROIDAB in the last 72 hours. Anemia Panel: No results for input(s): VITAMINB12, FOLATE, FERRITIN, TIBC, IRON, RETICCTPCT in the last 72 hours. Sepsis Labs: Recent Labs  Lab 02/02/21 0926 02/02/21 0927 02/02/21 1139  PROCALCITON  --  3.17  --   LATICACIDVEN 3.0*  --  1.8    Recent Results (from the past 240 hour(s))  Resp Panel by RT-PCR (Flu A&B, Covid) Nasopharyngeal Swab     Status: None   Collection Time: 02/02/21  9:27 AM   Specimen: Nasopharyngeal Swab; Nasopharyngeal(NP) swabs in vial transport medium  Result Value Ref Range Status   SARS Coronavirus 2 by RT PCR NEGATIVE NEGATIVE Final    Comment: (NOTE) SARS-CoV-2 target nucleic acids are NOT DETECTED.  The SARS-CoV-2 RNA is generally detectable in upper respiratory specimens during the acute phase of infection. The lowest concentration of SARS-CoV-2 viral copies this assay can detect is 138 copies/mL. A negative result does not preclude SARS-Cov-2 infection and should not be used as the sole basis for treatment or other patient management decisions. A negative result may occur with  improper specimen collection/handling, submission of specimen other than nasopharyngeal swab, presence of viral mutation(s) within the areas targeted by this assay, and inadequate number of  viral copies(<138 copies/mL). A negative result must be combined with clinical observations, patient history, and epidemiological information. The expected result is Negative.  Fact Sheet for Patients:  EntrepreneurPulse.com.au  Fact Sheet for Healthcare Providers:  IncredibleEmployment.be  This test is no t yet approved or cleared by the [place] FDA and  has been authorized for detection and/or diagnosis of SARS-CoV-2 by FDA under an Emergency Use Authorization (EUA). This EUA will remain  in effect (meaning this test can be used) for the duration of the COVID-19 declaration under Section 564(b)(1) of the Act, 21 U.S.C.section 360bbb-3(b)(1), unless the authorization is terminated  or revoked sooner.       Influenza A by PCR NEGATIVE NEGATIVE Final   Influenza B by PCR NEGATIVE NEGATIVE Final    Comment: (NOTE) The Xpert Xpress SARS-CoV-2/FLU/RSV plus assay is intended as an aid in the diagnosis of influenza from Nasopharyngeal swab specimens and should not be used as a sole basis for treatment. Nasal washings and aspirates are unacceptable for Xpert Xpress SARS-CoV-2/FLU/RSV testing.  Fact Sheet for Patients: EntrepreneurPulse.com.au  Fact Sheet for Healthcare Providers: IncredibleEmployment.be  This test is not yet approved or cleared by the [place] FDA and has been authorized for detection and/or diagnosis of SARS-CoV-2 by FDA under an Emergency Use Authorization (EUA). This EUA will remain in effect (meaning this test can be used) for the duration of the COVID-19 declaration under  Section 564(b)(1) of the Act, 21 U.S.C. section 360bbb-3(b)(1), unless the authorization is terminated or revoked.  Performed at [facility], [address] [place], [place] [address]   MRSA Next Gen by PCR, Nasal     Status: None   Collection Time: 02/03/21  9:32 AM   Specimen: Nasal Mucosa;  Nasal Swab  Result Value Ref Range Status   MRSA by PCR Next Gen NOT DETECTED NOT DETECTED Final    Comment: (NOTE) The GeneXpert MRSA Assay (FDA approved for NASAL specimens only), is one component of a comprehensive MRSA colonization surveillance program. It is not intended to diagnose MRSA infection nor to guide or monitor treatment for MRSA infections. Test performance is not FDA approved in patients less than 14 years old. Performed at [facility], [address], [place], [place] [address]          Radiology Studies: DG Chest Portable 1 View  Result Date: 02/02/2021 CLINICAL DATA:  Shortness of breath in a female at age 41. EXAM: PORTABLE CHEST 1 VIEW COMPARISON:  January 12, 2021. FINDINGS: EKG leads project over the chest. Trachea midline. Heart size is enlarged as before. Graded opacity suggested at the RIGHT lung base. Marked increased density about the RIGHT mid chest compared to the previous exam and scattered multifocal opacities including RIGHT lower lobe in the RIGHT retrocardiac region. Mild increased interstitial prominence in the LEFT chest. No visible pneumothorax. On limited assessment there is no acute skeletal process. IMPRESSION: Findings suspicious for multifocal pneumonia in the RIGHT chest potentially associated with RIGHT-sided effusion. Cardiomegaly with signs of vascular congestion. Also correlate with any signs of heart failure. Electronically Signed   By: Zetta Bills M.D.   On: 02/02/2021 10:03        Scheduled Meds:  amiodarone  100 mg Oral BID   azithromycin  500 mg Oral Daily   ferrous sulfate  325 mg Oral BID   guaiFENesin  600 mg Oral BID   levothyroxine  50 mcg Oral Q0600   mirtazapine  7.5 mg Oral QHS   multivitamin with minerals  1 tablet Oral Daily   Rivaroxaban  15 mg Oral Q supper   Continuous Infusions:  cefTRIAXone (ROCEPHIN)  IV       LOS: 1 day    Time spent: 46 minutes spent on chart review, discussion with  nursing staff, consultants, updating family and interview/physical exam; more than 50% of that time was spent in counseling and/or coordination of care.    Ione Sandusky J [place] DO [facility] Available via Epic secure chat 7am-7pm After these hours, please refer to coverage provider listed on [email or web address] 02/03/2021, 3:44 PM

## 2021-02-03 NOTE — Consult Note (Addendum)
Pharmacy Antibiotic Note  Frances Kent is a 87 y.o. female with PMH of paroxysmal atrial fibrillation, GERD, hypertension, hypothyroidism, CHF, PE and SVT who was admitted on 02/02/2021 with pneumonia.  Pharmacy has been consulted for cefepime and vancomycin dosing.  Plan: Pt received vancomycin 1000 mg x 1 in the ED on 12/21 and ordered vancomycin 1000 mg q48H. Predicted AUC 500. Goal AUC 400-600. Scr 0.93, Vd 0.72. Will adjust dose to 1250 mg q48H due to improvement in Scr. MRSA PCR ordered. IF negative recommended to discontinue vancomycin, it has a 99.2% negative predictive value for MRSA PNA. Obtain vancomycin levels after 4th or 5th dose if continued.   Will adjust cefepime to 2g q12H due to improvement in Scr.     Height: 5\' 3"  (160 cm) Weight: 52.4 kg (115 lb 8.3 oz) IBW/kg (Calculated) : 52.4  Temp (24hrs), Avg:98.7 F (37.1 C), Min:97.9 F (36.6 C), Max:100.3 F (37.9 C)  Recent Labs  Lab 02/02/21 0926 02/02/21 0927 02/02/21 1139 02/03/21 0709  WBC  --  10.8*  --  8.3  CREATININE  --  1.24*  --  0.93  LATICACIDVEN 3.0*  --  1.8  --      Estimated Creatinine Clearance: 34.6 mL/min (by C-G formula based on SCr of 0.93 mg/dL).    Allergies  Allergen Reactions   Statins     Severe myalgias    Antimicrobials this admission: Vancomycin 12/20 >>  Cefepime 12/20 >>   Microbiology results: 12/20 BCx: pending 12/20 MRSA PCR: ordered.   Thank you for allowing pharmacy to be a part of this patients care.  Oswald Hillock, PharmD 02/03/2021 9:34 AM

## 2021-02-03 NOTE — Evaluation (Signed)
Physical Therapy Evaluation Patient Details Name: Frances Kent MRN: [id number] DOB: 1930/09/11 Today's Date: 02/03/2021  History of Present Illness  Frances Kent is a 87 y.o. female with medical history significant of hypertension, hyperlipidemia, stroke, GERD, hypothyroidism, depression with anxiety, interstitial lung disease, anemia, SSS, SVT, PE and atrial fibrillation on Xarelto, mitral valve prolapse, CHF with EF of 40-45%, bilateral carotid artery stenosis, who presents with shortness of breath.   Clinical Impression  Pt admitted with above diagnosis. Pt in bed agreeable to PT with daughter present. Pt and daughter able to report pt's PLOF, DME, home set up. Daughter reporting able to provide assist 24/7 and typically only assists with pt donning/doffing socks and performs laundry as laundry room is downstairs in basement. Otherwise pt is mod-I with RW in home and with other ADL's/IADL's. SPO2 on 3L/min via [place] at 98-100% at rest, HR trending mid 70's BPM.With increased time, pt able to perform bed mobility with supervision and HOB elevated, stand with minguard to RW requiring VC's for hand placement, and amb in room with RW. Pt amb at a slow but consistent cadence with no imbalance noted with RW in straight line or with turning. Does require mod VC's during ambulation to maintain RW closer to BOS with fair carryover. Pt returned to recliner and educated on benefits of sitting upright for pulmonary hygiene. Spo2 did desat to 91% after ambulation with HR in mind to upper 80's BPM. All needs in reach. Pt safe to return home with 24/7 family supervision/assist with [facility] PT to improve deficits in strength/endurance with standing and ambulation tasks and to improve safe/correct use of AD to reduce risk of falls. Pt currently with functional limitations due to the deficits listed below (see PT Problem List). Pt will benefit from skilled PT to increase their independence and safety with mobility to  allow discharge to the venue listed below.     Recommendations for follow up therapy are one component of a multi-disciplinary discharge planning process, led by the attending physician.  Recommendations may be updated based on patient status, additional functional criteria and insurance authorization.  Follow Up Recommendations Home health PT    Assistance Recommended at Discharge Frequent or constant Supervision/Assistance  Functional Status Assessment Patient has had a recent decline in their functional status and demonstrates the ability to make significant improvements in function in a reasonable and predictable amount of time.  Equipment Recommendations  None recommended by PT    Recommendations for Other Services       Precautions / Restrictions Precautions Precautions: Fall Restrictions Weight Bearing Restrictions: No      Mobility  Bed Mobility Overal bed mobility: Needs Assistance Bed Mobility: Supine to Sit     Supine to sit: Supervision;HOB elevated     General bed mobility comments: no use of bed rails Patient Response: Cooperative  Transfers Overall transfer level: Needs assistance Equipment used: Rolling walker (2 wheels) Transfers: Sit to/from Stand Sit to Stand: Min guard           General transfer comment: VC's for hand placement prior to standing    Ambulation/Gait Ambulation/Gait assistance: Min guard Gait Distance (Feet): 25 Feet Assistive device: Rolling walker (2 wheels) Gait Pattern/deviations: Step-through pattern;Decreased step length - right;Decreased step length - left;Decreased stride length;Trunk flexed       General Gait Details: Increased time to perform. Keeps RW far outside BOs, corrects with minimal cuing.  Stairs            Wheelchair  Mobility    Modified Rankin (Stroke Patients Only)       Balance Overall balance assessment: Needs assistance Sitting-balance support: No upper extremity supported;Feet  supported Sitting balance-Leahy Scale: Good     Standing balance support: Bilateral upper extremity supported;During functional activity Standing balance-Leahy Scale: Fair Standing balance comment: Relies on RW for support                             Pertinent Vitals/Pain Pain Assessment: No/denies pain    Home Living Family/patient expects to be discharged to:: Private residence Living Arrangements: Children Available Help at Discharge: Family;Available 24 hours/day Type of Home: House Home Access: Level entry       Home Layout: Laundry or work area in basement;Able to live on main level with bedroom/bathroom;Two level Home Equipment: Conservation officer, nature (2 wheels);Rollator (4 wheels);BSC/3in1;Wheelchair - manual;Grab bars - tub/shower;Shower seat - built in;Shower seat      Prior Function Prior Level of Function : Independent/Modified Independent             Mobility Comments: RW for households ADLs Comments: Family assists with laundry downstairs and with donning/doffing socks     Hand Dominance   Dominant Hand: Left    Extremity/Trunk Assessment   Upper Extremity Assessment Upper Extremity Assessment: Generalized weakness    Lower Extremity Assessment Lower Extremity Assessment: Generalized weakness    Cervical / Trunk Assessment Cervical / Trunk Assessment: Normal  Communication   Communication: No difficulties  Cognition Arousal/Alertness: Awake/alert Behavior During Therapy: WFL for tasks assessed/performed Overall Cognitive Status: Within Functional Limits for tasks assessed                                          General Comments General comments (skin integrity, edema, etc.): SPo2 98% at rest on3 L/min. Post ambulation 91%. HR trending mid 70's to 85 BPM with activity. Returned to upper 70's post ambulation.    Exercises General Exercises - Lower Extremity Ankle Circles/Pumps: AROM;Supine;Both;20 reps Long Arc Quad:  AROM;Seated;Strengthening;Both;10 reps Straight Leg Raises: AROM;Supine;Both;10 reps Hip Flexion/Marching: Seated;AROM;Strengthening;Both;10 reps Other Exercises Other Exercises: Role of PT in acute setting, d/c recs, safe use of DME, therex   Assessment/Plan    PT Assessment Patient needs continued PT services  PT Problem List Decreased balance;Decreased mobility;Decreased knowledge of use of DME;Decreased strength       PT Treatment Interventions DME instruction;Gait training;Stair training;Functional mobility training;Therapeutic activities;Therapeutic exercise;Balance training;Patient/family education    PT Goals (Current goals can be found in the Care Plan section)  Acute Rehab PT Goals Patient Stated Goal: to return home PT Goal Formulation: With patient Time For Goal Achievement: 02/17/21 Potential to Achieve Goals: Good    Frequency Min 2X/week   Barriers to discharge        Co-evaluation               AM-PAC PT "6 Clicks" Mobility  Outcome Measure Help needed turning from your back to your side while in a flat bed without using bedrails?: A Little Help needed moving from lying on your back to sitting on the side of a flat bed without using bedrails?: A Little Help needed moving to and from a bed to a chair (including a wheelchair)?: A Little Help needed standing up from a chair using your arms (e.g., wheelchair or bedside chair)?: A Little  Help needed to walk in hospital room?: A Little Help needed climbing 3-5 steps with a railing? : A Lot 6 Click Score: 17    End of Session Equipment Utilized During Treatment: Gait belt Activity Tolerance: Patient tolerated treatment well Patient left: in chair;with call bell/phone within reach;with family/visitor present Nurse Communication: Mobility status PT Visit Diagnosis: Difficulty in walking, not elsewhere classified (R26.2);Muscle weakness (generalized) (M62.81)    Time: 4458-4835 PT Time Calculation (min)  (ACUTE ONLY): 31 min   Charges:   PT Evaluation $PT Eval Moderate Complexity: 1 Mod PT Treatments $Therapeutic Exercise: 8-22 mins        Merrell Borsuk M. Fairly IV, PT, DPT Physical Therapist- [facility]  02/03/2021, 1:52 PM

## 2021-02-03 NOTE — Evaluation (Signed)
Occupational Therapy Evaluation Patient Details Name: Frances Kent MRN: [id number] DOB: August 13, 1930 Today's Date: 02/03/2021   History of Present Illness Frances Kent is a 87 y.o. female with medical history significant of hypertension, hyperlipidemia, stroke, GERD, hypothyroidism, depression with anxiety, interstitial lung disease, anemia, SSS, SVT, PE and atrial fibrillation on Xarelto, mitral valve prolapse, CHF with EF of 40-45%, bilateral carotid artery stenosis, who presents with shortness of breath.   Clinical Impression   Pt was seen for OT evaluation this date. Prior to hospital admission, pt was generally independent with ADL management. Pt lives with her granddaughter and reports her daughter provides assistance. Currently pt demonstrates impairments as described below (See OT problem list) which functionally limit her ability to perform ADL/self-care tasks. Pt currently requires SUPERVISION assist for bed/functional mobility with intermittent CGA for standing grooming tasks at sink. Pt would benefit from skilled OT services to address noted impairments and functional limitations (see below for any additional details) in order to maximize safety and independence while minimizing falls risk and caregiver burden. Upon hospital discharge, recommend HHOT to maximize pt safety and return to functional independence during meaningful occupations of daily life.     Recommendations for follow up therapy are one component of a multi-disciplinary discharge planning process, led by the attending physician.  Recommendations may be updated based on patient status, additional functional criteria and insurance authorization.   Follow Up Recommendations  Home health OT    Assistance Recommended at Discharge Intermittent Supervision/Assistance  Functional Status Assessment  Patient has had a recent decline in their functional status and demonstrates the ability to make significant improvements in  function in a reasonable and predictable amount of time.  Equipment Recommendations  None recommended by OT    Recommendations for Other Services       Precautions / Restrictions Precautions Precautions: Fall Restrictions Weight Bearing Restrictions: No      Mobility Bed Mobility Overal bed mobility: Needs Assistance Bed Mobility: Supine to Sit     Supine to sit: Supervision;HOB elevated Sit to supine: Supervision   General bed mobility comments: Increased time/effort to perform.    Transfers Overall transfer level: Needs assistance Equipment used: Rolling walker (2 wheels) Transfers: Sit to/from Stand Sit to Stand: Min guard           General transfer comment: VCs for hand placement/sequencing with STS attempts.      Balance Overall balance assessment: Needs assistance Sitting-balance support: No upper extremity supported;Feet supported Sitting balance-Leahy Scale: Good Sitting balance - Comments: Good sitting balance reaching within BOS at EOB   Standing balance support: Bilateral upper extremity supported;During functional activity;Reliant on assistive device for balance;Single extremity supported Standing balance-Leahy Scale: Fair Standing balance comment: Requires at least 1 UE support for standing balance.                           ADL either performed or assessed with clinical judgement   ADL Overall ADL's : Needs assistance/impaired                                       General ADL Comments: Pt is functionally limited by generalized weakness, decreased activity tolerance, and cardiopulmonary status. She requires supervision for safety during functional mobility using her RW. She is able to perform standing grooming tasks at sink with intermittent CGA.     Vision  Baseline Vision/History: 1 Wears glasses Patient Visual Report: No change from baseline       Perception     Praxis      Pertinent Vitals/Pain Pain  Assessment: No/denies pain     Hand Dominance Left   Extremity/Trunk Assessment Upper Extremity Assessment Upper Extremity Assessment: Generalized weakness   Lower Extremity Assessment Lower Extremity Assessment: Generalized weakness   Cervical / Trunk Assessment Cervical / Trunk Assessment: Kyphotic   Communication Communication Communication: No difficulties   Cognition Arousal/Alertness: Awake/alert Behavior During Therapy: WFL for tasks assessed/performed Overall Cognitive Status: Within Functional Limits for tasks assessed                                 General Comments: Pleasant, conversational, eager to participate in therapy session.     General Comments  VSS t/o session. HR remains in 70's with activity. SO2 remains WFL >/= 92% with pt on 3 L [place].    Exercises General Exercises - Lower Extremity Ankle Circles/Pumps: AROM;Supine;Both;20 reps Long Arc Quad: AROM;Seated;Strengthening;Both;10 reps Straight Leg Raises: AROM;Supine;Both;10 reps Hip Flexion/Marching: Seated;AROM;Strengthening;Both;10 reps Other Exercises Other Exercises: Pt educated on role of OT in acute setting, safe use of AE/DME for ADL management, safe transfer techniques, and routines modifications to support safety and functional independence during ADL management. OT faciltates bed/functional mobility & standing grooming tasks at sink. See ADL section for additional detail.   Shoulder Instructions      Home Living Family/patient expects to be discharged to:: Private residence Living Arrangements: Other relatives (Granddaughter) Available Help at Discharge: Family;Available 24 hours/day Type of Home: House Home Access: Level entry     Home Layout: Laundry or work area in basement;Able to live on main level with bedroom/bathroom;Two level     Bathroom Shower/Tub: Occupational psychologist: Standard Bathroom Accessibility: No   Home Equipment: Conservation officer, nature (2  wheels);Rollator (4 wheels);BSC/3in1;Wheelchair - manual;Grab bars - tub/shower;Shower seat - built in;Shower seat          Prior Functioning/Environment Prior Level of Function : Independent/Modified Independent             Mobility Comments: RW for household distances. 1 fall in last 6 months (1 day prior to admission) ADLs Comments: Family assists with IADL management including cooking, laundry, and shopping. Pt requires occasional assist with LB dressing tasks such as donning/doffing socks.        OT Problem List: Decreased strength;Decreased activity tolerance;Impaired balance (sitting and/or standing)      OT Treatment/Interventions: Self-care/ADL training;Therapeutic exercise;Energy conservation;DME and/or AE instruction;Therapeutic activities;Patient/family education;Balance training    OT Goals(Current goals can be found in the care plan section) Acute Rehab OT Goals Patient Stated Goal: To go home. OT Goal Formulation: With patient Time For Goal Achievement: 01/27/21 Potential to Achieve Goals: Good  OT Frequency: Min 2X/week   Barriers to D/C:            Co-evaluation              AM-PAC OT "6 Clicks" Daily Activity     Outcome Measure Help from another person eating meals?: None Help from another person taking care of personal grooming?: A Little Help from another person toileting, which includes using toliet, bedpan, or urinal?: A Little Help from another person bathing (including washing, rinsing, drying)?: A Little Help from another person to put on and taking off regular upper body clothing?: A Little Help from  another person to put on and taking off regular lower body clothing?: A Little 6 Click Score: 19   End of Session Equipment Utilized During Treatment: Rolling walker (2 wheels);Gait belt  Activity Tolerance: Patient tolerated treatment well Patient left: in bed;with call bell/phone within reach;with bed alarm set;with family/visitor  present  OT Visit Diagnosis: Muscle weakness (generalized) (M62.81)                Time: 7276-1848 OT Time Calculation (min): 32 min Charges:  OT General Charges $OT Visit: 1 Visit OT Evaluation $OT Eval Moderate Complexity: 1 Mod OT Treatments $Self Care/Home Management : 23-37 mins  Shara Blazing, M.S., OTR/L [facility] Ascom: [phone] 02/03/21, 4:28 PM

## 2021-02-04 LAB — CBC
HCT: 31.7 % — ABNORMAL LOW (ref 36.0–46.0)
Hemoglobin: 9.9 g/dL — ABNORMAL LOW (ref 12.0–15.0)
MCH: 26.1 pg (ref 26.0–34.0)
MCHC: 31.2 g/dL (ref 30.0–36.0)
MCV: 83.4 fL (ref 80.0–100.0)
Platelets: 264 10*3/uL (ref 150–400)
RBC: 3.8 MIL/uL — ABNORMAL LOW (ref 3.87–5.11)
RDW: 22.5 % — ABNORMAL HIGH (ref 11.5–15.5)
WBC: 9.1 10*3/uL (ref 4.0–10.5)
nRBC: 0 % (ref 0.0–0.2)

## 2021-02-04 LAB — BASIC METABOLIC PANEL
Anion gap: 8 (ref 5–15)
BUN: 29 mg/dL — ABNORMAL HIGH (ref 8–23)
CO2: 20 mmol/L — ABNORMAL LOW (ref 22–32)
Calcium: 9 mg/dL (ref 8.9–10.3)
Chloride: 109 mmol/L (ref 98–111)
Creatinine, Ser: 0.96 mg/dL (ref 0.44–1.00)
GFR, Estimated: 57 mL/min — ABNORMAL LOW (ref >60)
Glucose, Bld: 133 mg/dL — ABNORMAL HIGH (ref 70–99)
Potassium: 4.6 mmol/L (ref 3.5–5.1)
Sodium: 137 mmol/L (ref 135–145)

## 2021-02-04 LAB — PROCALCITONIN: Procalcitonin: 5.9 ng/mL

## 2021-02-04 MED ORDER — BLISTEX MEDICATED EX OINT
TOPICAL_OINTMENT | CUTANEOUS | Status: DC | PRN
  Filled 2021-02-04: qty 6.3

## 2021-02-04 MED ORDER — SODIUM CHLORIDE 0.9 % IV SOLN: INTRAVENOUS | Status: DC | PRN

## 2021-02-04 MED ORDER — SODIUM CHLORIDE 0.9 % IV SOLN
2.0000 g | Freq: Two times a day (BID) | INTRAVENOUS | Status: DC
  Administered 2021-02-04 – 2021-02-06 (×5): 2 g via INTRAVENOUS
  Filled 2021-02-04 (×6): qty 2

## 2021-02-04 MED ORDER — BENZONATATE 100 MG PO CAPS
100.0000 mg | ORAL_CAPSULE | Freq: Three times a day (TID) | ORAL | Status: DC | PRN
  Administered 2021-02-04 – 2021-02-06 (×3): 100 mg via ORAL
  Filled 2021-02-04 (×3): qty 1

## 2021-02-04 NOTE — Consult Note (Signed)
Pharmacy Antibiotic Note  Frances Kent is a 87 y.o. female with PMH of paroxysmal atrial fibrillation, GERD, hypertension, hypothyroidism, CHF, PE and SVT who was admitted on 02/02/2021 with pneumonia.  Pharmacy has been consulted for cefepime dosing.  Plan: Changed to ceftriaxone 12/21 but plan to change back to cefepime 2 g q12H to due patient's resp status worsening.   Height: 5\' 3"  (160 cm) Weight: 52.2 kg (115 lb 1.3 oz) IBW/kg (Calculated) : 52.4  Temp (24hrs), Avg:98.5 F (36.9 C), Min:98.1 F (36.7 C), Max:99.4 F (37.4 C)  Recent Labs  Lab 02/02/21 0926 02/02/21 0927 02/02/21 1139 02/03/21 0709 02/04/21 0643  WBC  --  10.8*  --  8.3 9.1  CREATININE  --  1.24*  --  0.93 0.96  LATICACIDVEN 3.0*  --  1.8  --   --      Estimated Creatinine Clearance: 33.4 mL/min (by C-G formula based on SCr of 0.96 mg/dL).    Allergies  Allergen Reactions   Statins     Severe myalgias    Antimicrobials this admission: Vancomycin 12/20 >> 12/21 Cefepime 12/20 >>   Microbiology results: 12/20 BCx: pending 12/20 MRSA PCR: negative  Thank you for allowing pharmacy to be a part of this patients care.  Oswald Hillock, PharmD 02/04/2021 10:12 AM

## 2021-02-04 NOTE — Progress Notes (Addendum)
Occupational Therapy Treatment Patient Details Name: Frances Kent MRN: [id number] DOB: 19-Aug-1930 Today's Date: 02/04/2021   History of present illness Pt. is a 87 y.o. female with medical history significant of hypertension, hyperlipidemia, stroke, GERD, hypothyroidism, depression with anxiety, interstitial lung disease, anemia, SSS, SVT, PE and atrial fibrillation on Xarelto, mitral valve prolapse, CHF with EF of 40-45%, bilateral carotid artery stenosis, who presents with shortness of breath.   OT comments  Pt. Reports having had a rough night last night, and coughed throughout the night. Pt. Education was provided about energy conservation, work simplification techniques for ADLs, and IADLs. Pt. Was provided with a visual handout to review at her leisure. Pt. Education was provided about pursed lip breathing techniques, and requires cues for proper technique. Pt. SO2 97% on 3L O2. Pt. Reports that she does not have O2 at home. Pt. Continues to benefit from OT services for ADL training, A/E training, and pt. education about energy conservation, work simplification techniques, home modification, and DME. Pt. Plans to return home upon discharge with family to assist pt. as needed. Pt. Could benefit from follow-up [place] services upon discharge.    Recommendations for follow up therapy are one component of a multi-disciplinary discharge planning process, led by the attending physician.  Recommendations may be updated based on patient status, additional functional criteria and insurance authorization.    Follow Up Recommendations  Home health OT    Assistance Recommended at Discharge Intermittent Supervision/Assistance  Equipment Recommendations  None recommended by OT    Recommendations for Other Services      Precautions / Restrictions Precautions Precautions: Fall Restrictions Weight Bearing Restrictions: No       Mobility Bed Mobility Overal bed mobility: Needs Assistance Bed  Mobility: Supine to Sit       Sit to supine: Supervision        Transfers Overall transfer level: Needs assistance Equipment used: Rolling walker (2 wheels) Transfers: Sit to/from Stand Sit to Stand: Min guard                 Balance                                           ADL either performed or assessed with clinical judgement   ADL                       Lower Body Dressing: Set up;Bed level                 General ADL Comments: Pt. education was provided about Energy conservation/work simplification techniques.    Extremity/Trunk Assessment Upper Extremity Assessment Upper Extremity Assessment: Generalized weakness            Vision Baseline Vision/History: 1 Wears glasses Ability to See in Adequate Light: 0 Adequate Patient Visual Report: No change from baseline     Perception     Praxis      Cognition Arousal/Alertness: Awake/alert Behavior During Therapy: WFL for tasks assessed/performed Overall Cognitive Status: Within Functional Limits for tasks assessed                                           Exercises Other Exercises Other Exercises: Pt. edcuation was provided about energy conservation, work  simplification techniques for ADLs, and IADLs   Shoulder Instructions       General Comments      Pertinent Vitals/ Pain       Pain Assessment: No/denies pain  Home Living                                          Prior Functioning/Environment              Frequency  Min 2X/week        Progress Toward Goals  OT Goals(current goals can now be found in the care plan section)  Progress towards OT goals: Progressing toward goals  Acute Rehab OT Goals OT Goal Formulation: With patient Time For Goal Achievement: 02/18/21 Potential to Achieve Goals: Good  Plan      Co-evaluation                 AM-PAC OT "6 Clicks" Daily Activity     Outcome  Measure   Help from another person eating meals?: None Help from another person taking care of personal grooming?: A Little Help from another person toileting, which includes using toliet, bedpan, or urinal?: A Little Help from another person bathing (including washing, rinsing, drying)?: A Little Help from another person to put on and taking off regular upper body clothing?: A Little Help from another person to put on and taking off regular lower body clothing?: A Little 6 Click Score: 19    End of Session    OT Visit Diagnosis: Muscle weakness (generalized) (M62.81)   Activity Tolerance Patient tolerated treatment well   Patient Left in bed;with call bell/phone within reach;with bed alarm set;with family/visitor present   Nurse Communication Mobility status        Time: 1219-7588 OT Time Calculation (min): 17 min  Charges: OT General Charges $OT Visit: 1 Visit OT Treatments $Self Care/Home Management : 8-22 mins  Harrel Carina, MS, OTR/L   Harrel Carina 02/04/2021, 12:26 PM

## 2021-02-04 NOTE — Progress Notes (Signed)
PROGRESS NOTE    Frances Kent  MHD:[id number] DOB: 1930-09-28 DOA: 02/02/2021 PCP: [name], MD    Brief Narrative:  Frances Kent is an 87 year old female with past medical history significant for essential hypertension, hyperlipidemia, CVA, GERD, hypothyroidism, depression/anxiety, interstitial lung disease, anemia, SSS, SVT, history of pulmonary embolism, proximal atrial fibrillation on Xarelto, mitral valve prolapse, chronic systolic/diastolic congestive heart failure, bilateral carotid artery stenosis who presents to [facility] ED via EMS on 12/20 with progressive shortness of breath.  Patient also reports productive cough with associated fever/chills.  Denies chest pain, no nausea/vomiting/diarrhea, no abdominal pain, no symptoms of urinary tract infection.  On EMS arrival, patient was noted to be desaturating to the 80s with severe respiratory distress, accessory muscle use and placed on CPAP; and subsequently transported to the ED for further evaluation.  In the ED, temperature 100.3 F, HR 83, RR 26, BP 168/78, SPO2 100% on BiPAP with FiO2 60%.  VBG with pH 7.37, PCO2 31, PO2 32.0.  Sodium 133, potassium 4.3, chloride 105, CO2 18, glucose 125, BUN 20, creatinine 1.24.  Total bilirubin 2.8.  BNP elevated 798.7.  Lactic acid 3.0.  WBC 10.8, hemoglobin 10.1, platelets 259.  Procalcitonin 3.17.  COVID-19 PCR negative.  Influenza A/B PCR negative.  Blood cultures x2 ordered.  Chest x-ray with multifocal pneumonia right chest with right-sided effusion, cardiomegaly with vascular congestion.  Patient was started on empiric antibiotics by EDP.  Hospital service consulted for further evaluation management of acute respite failure secondary to pneumonia.    Assessment & Plan:   Principal Problem:   HCAP (healthcare-associated pneumonia) Active Problems:   History of pulmonary embolism   Atrial fibrillation status post cardioversion [facility])   Hypothyroidism due to acquired atrophy of  thyroid   Chronic systolic CHF (congestive heart failure) (HCC)   Essential hypertension   Stage 3a chronic kidney disease (HCC)   Acute respiratory failure with hypoxia (HCC)   ILD (interstitial lung disease) (HCC)   CVA (cerebral vascular accident) ([place])   Iron deficiency anemia   Severe sepsis ([place])   Depression with anxiety   Sepsis, POA Lactic acidosis: Resolved Community acquired pneumonia, suspect atypical versus gram-negative organism Acute respiratory failure with hypoxia, POA Patient presenting to the ED with progressive shortness of breath, fever/chills and found to be hypoxic on EMS arrival with SPO2 in the 80s.  Patient was confused with associated metabolic encephalopathy and lactic acid elevated at 3.0 likely secondary to commune acquired pneumonia suspicious for atypical versus gram-negative organism.  MRSA PCR negative.  Initially requiring BiPAP to maintain oxygenation. --lactic acid 3.0>1.8 --WBC 10.8>8.3>9.1 --Procalcitonin 3.17>5.90 --Blood cultures x2: no growth x 2 days --Strep pneumonia/Legionella urinary antigen: Pending --Azithromycin 500 mg p.o. daily --Cefepime 2 g IV every 12 hours --Mucinex twice daily --Incentive spirometry --Flutter valve --Continue supplemental oxygen, maintain SPO2 greater than 92%, BiPAP titrated off now to nasal cannula at 3 L --BiPAP nightly --Repeat chest x-ray in the a.m.  Acute metabolic encephalopathy, POA Upon presentation, patient was noted to be confused.  Etiology likely secondary to sepsis from underlying pneumonia with hypoxia.  Patient was started on BiPAP with improvement of SPO2 and empiric antibiotics with resolution of encephalopathy. --Continue supportive care  Hyperlipidemia --Not on statin outpatient  Hypothyroidism --Levothyroxine 50 mcg p.o. daily  Depression/anxiety --Alprazolam 0.25 mg qHS PRN  Hx Interstitial lung disease --Albuterol neb as needed --Supplemental oxygen as above --Supportive  care --Outpatient follow-up with pulmonology  Anemia --Ferrous sulfate 325 mg p.o. twice  daily  Hx pulmonary embolism Paroxysmal atrial fibrillation  --Amiodarone 100 mg p.o. twice daily --Xarelto 15 mg p.o. daily  Essential hypertension Chronic systolic/diastolic congestive heart failure On furosemide 40 mg p.o. daily at home.  Follows with cardiology outpatient, Dr. [name]. On presentation, admitted with sepsis as above with chest x-ray with pulmonary vascular congestion and elevated BNP of 798.7.  TTE 11/01/2020 with LVEF 40-45%, LV mildly decreased function, no LV regional wall motion abnormalities, LA/RA moderately dilated, moderate/severe MR, moderate TR, no aortic stenosis, IVC normal in size. --Resume furosemide 40 mg p.o. daily --Strict I's and O's and daily weights  Bilateral carotid artery stenosis Outpatient follow-up with cardiology/vascular surgery  Weakness/deconditioning/debility: --PT/OT recommending home health   DVT prophylaxis:  Rivaroxaban (XARELTO) tablet 15 mg    Code Status: Partial Code Family Communication: Updated daughter present at bedside this morning  Disposition Plan:  Level of care: Progressive Status is: Inpatient  Remains inpatient appropriate because: Continues on IV antibiotics, slightly worse respiratory status this morning    Consultants:  None  Procedures:  BiPAP  Antimicrobials:  Vancomycin 12/20 - 12/21 Cefepime 12/20 - 12/21; 12/22>> Azithromycin 12/21>> Ceftriaxone 12/21 - 12/21   Subjective: Patient seen examined at bedside, resting comfortably.  Receiving neb treatment from respiratory this morning.  States very poor sleep overnight, continues cough.  Feels weaker this morning with increased shortness of breath.  Given increase in procalcitonin, will transition ceftriaxone back to cefepime today.  No other specific complaints at this time.  Denies headache, no dizziness, no chest pain, no abdominal pain, no  fever/chills/night sweats, no nausea/vomiting/diarrhea, no congestion, no paresthesias.  No acute events overnight per nursing staff.  Objective: Vitals:   02/04/21 0324 02/04/21 0424 02/04/21 0426 02/04/21 0724  BP: (!) 148/71   (!) 160/66  Pulse: 91   85  Resp: 18   17  Temp: 98.2 F (36.8 C)   98.2 F (36.8 C)  TempSrc: Oral     SpO2: 99%   100%  Weight:  52.2 kg 52.2 kg   Height:        Intake/Output Summary (Last 24 hours) at 02/04/2021 1128 Last data filed at 02/04/2021 1000 Gross per 24 hour  Intake 1180 ml  Output 200 ml  Net 980 ml   Filed Weights   02/03/21 0414 02/04/21 0424 02/04/21 0426  Weight: 52.4 kg 52.2 kg 52.2 kg    Examination:  General exam: Appears calm and comfortable, ill in appearance Respiratory system: Decreased breath sounds bilateral bases with mild crackles, normal respiratory effort without accessory muscle use, on 3 L nasal cannula Cardiovascular system: S1 & S2 heard, RRR. No JVD, murmurs, rubs, gallops or clicks. No pedal edema. Gastrointestinal system: Abdomen is nondistended, soft and nontender. No organomegaly or masses felt. Normal bowel sounds heard. Central nervous system: Alert and oriented. No focal neurological deficits. Extremities: Symmetric 5 x 5 power. Skin: No rashes, lesions or ulcers Psychiatry: Judgement and insight appear normal. Mood & affect appropriate.     Data Reviewed: I have personally reviewed following labs and imaging studies  CBC: Recent Labs  Lab 02/02/21 0927 02/03/21 0709 02/04/21 0643  WBC 10.8* 8.3 9.1  NEUTROABS 9.3*  --   --   HGB 10.1* 9.5* 9.9*  HCT 32.1* 31.0* 31.7*  MCV 85.1 84.0 83.4  PLT 259 227 782   Basic Metabolic Panel: Recent Labs  Lab 02/02/21 0927 02/03/21 0709 02/04/21 0643  NA 133* 136 137  K 4.3 5.1 4.6  CL  105 109 109  CO2 18* 20* 20*  GLUCOSE 125* 146* 133*  BUN 20 25* 29*  CREATININE 1.24* 0.93 0.96  CALCIUM 9.1 8.8* 9.0   GFR: Estimated Creatinine  Clearance: 33.4 mL/min (by C-G formula based on SCr of 0.96 mg/dL). Liver Function Tests: Recent Labs  Lab 02/02/21 0927  AST 36  ALT 31  ALKPHOS 102  BILITOT 2.8*  PROT 7.0  ALBUMIN 3.5   No results for input(s): LIPASE, AMYLASE in the last 168 hours. No results for input(s): AMMONIA in the last 168 hours. Coagulation Profile: No results for input(s): INR, PROTIME in the last 168 hours. Cardiac Enzymes: No results for input(s): CKTOTAL, CKMB, CKMBINDEX, TROPONINI in the last 168 hours. BNP (last 3 results) No results for input(s): PROBNP in the last 8760 hours. HbA1C: No results for input(s): HGBA1C in the last 72 hours. CBG: No results for input(s): GLUCAP in the last 168 hours. Lipid Profile: No results for input(s): CHOL, HDL, LDLCALC, TRIG, CHOLHDL, LDLDIRECT in the last 72 hours. Thyroid Function Tests: No results for input(s): TSH, T4TOTAL, FREET4, T3FREE, THYROIDAB in the last 72 hours. Anemia Panel: No results for input(s): VITAMINB12, FOLATE, FERRITIN, TIBC, IRON, RETICCTPCT in the last 72 hours. Sepsis Labs: Recent Labs  Lab 02/02/21 0926 02/02/21 0927 02/02/21 1139 02/04/21 0643  PROCALCITON  --  3.17  --  5.90  LATICACIDVEN 3.0*  --  1.8  --     Recent Results (from the past 240 hour(s))  Resp Panel by RT-PCR (Flu A&B, Covid) Nasopharyngeal Swab     Status: None   Collection Time: 02/02/21  9:27 AM   Specimen: Nasopharyngeal Swab; Nasopharyngeal(NP) swabs in vial transport medium  Result Value Ref Range Status   SARS Coronavirus 2 by RT PCR NEGATIVE NEGATIVE Final    Comment: (NOTE) SARS-CoV-2 target nucleic acids are NOT DETECTED.  The SARS-CoV-2 RNA is generally detectable in upper respiratory specimens during the acute phase of infection. The lowest concentration of SARS-CoV-2 viral copies this assay can detect is 138 copies/mL. A negative result does not preclude SARS-Cov-2 infection and should not be used as the sole basis for treatment  or other patient management decisions. A negative result may occur with  improper specimen collection/handling, submission of specimen other than nasopharyngeal swab, presence of viral mutation(s) within the areas targeted by this assay, and inadequate number of viral copies(<138 copies/mL). A negative result must be combined with clinical observations, patient history, and epidemiological information. The expected result is Negative.  Fact Sheet for Patients:  EntrepreneurPulse.com.au  Fact Sheet for Healthcare Providers:  IncredibleEmployment.be  This test is no t yet approved or cleared by the [place] FDA and  has been authorized for detection and/or diagnosis of SARS-CoV-2 by FDA under an Emergency Use Authorization (EUA). This EUA will remain  in effect (meaning this test can be used) for the duration of the COVID-19 declaration under Section 564(b)(1) of the Act, 21 U.S.C.section 360bbb-3(b)(1), unless the authorization is terminated  or revoked sooner.       Influenza A by PCR NEGATIVE NEGATIVE Final   Influenza B by PCR NEGATIVE NEGATIVE Final    Comment: (NOTE) The Xpert Xpress SARS-CoV-2/FLU/RSV plus assay is intended as an aid in the diagnosis of influenza from Nasopharyngeal swab specimens and should not be used as a sole basis for treatment. Nasal washings and aspirates are unacceptable for Xpert Xpress SARS-CoV-2/FLU/RSV testing.  Fact Sheet for Patients: EntrepreneurPulse.com.au  Fact Sheet for Healthcare Providers: IncredibleEmployment.be  This  test is not yet approved or cleared by the [place] and has been authorized for detection and/or diagnosis of SARS-CoV-2 by FDA under an Emergency Use Authorization (EUA). This EUA will remain in effect (meaning this test can be used) for the duration of the COVID-19 declaration under Section 564(b)(1) of the Act, 21 U.S.C. section  360bbb-3(b)(1), unless the authorization is terminated or revoked.  Performed at [facility], [address] [place], [place] [address]   Blood culture (routine x 2)     Status: None (Preliminary result)   Collection Time: 02/02/21 10:31 AM   Specimen: BLOOD  Result Value Ref Range Status   Specimen Description BLOOD RIGHT FA  Final   Special Requests   Final    BOTTLES DRAWN AEROBIC AND ANAEROBIC Blood Culture adequate volume   Culture   Final    NO GROWTH 2 DAYS Performed at [facility], [address], [place], [place] [address]    Report Status PENDING  Incomplete  Blood culture (routine x 2)     Status: None (Preliminary result)   Collection Time: 02/02/21 10:31 AM   Specimen: BLOOD  Result Value Ref Range Status   Specimen Description BLOOD RIGHT HAND  Final   Special Requests   Final    BOTTLES DRAWN AEROBIC AND ANAEROBIC Blood Culture adequate volume   Culture   Final    NO GROWTH 2 DAYS Performed at [facility], [address], [place], [place] [address]    Report Status PENDING  Incomplete  MRSA Next Gen by PCR, Nasal     Status: None   Collection Time: 02/03/21  9:32 AM   Specimen: Nasal Mucosa; Nasal Swab  Result Value Ref Range Status   MRSA by PCR Next Gen NOT DETECTED NOT DETECTED Final    Comment: (NOTE) The GeneXpert MRSA Assay (FDA approved for NASAL specimens only), is one component of a comprehensive MRSA colonization surveillance program. It is not intended to diagnose MRSA infection nor to guide or monitor treatment for MRSA infections. Test performance is not FDA approved in patients less than 32 years old. Performed at [facility], [address], [place], [place] [address]          Radiology Studies: No results found.      Scheduled Meds:  amiodarone  100 mg Oral BID   azithromycin  500 mg Oral Daily   ferrous sulfate  325 mg Oral BID   furosemide  40 mg Oral Daily   guaiFENesin  600 mg  Oral BID   levothyroxine  50 mcg Oral Q0600   mirtazapine  7.5 mg Oral QHS   multivitamin with minerals  1 tablet Oral Daily   Rivaroxaban  15 mg Oral Q supper   Continuous Infusions:  ceFEPime (MAXIPIME) IV       LOS: 2 days    Time spent: 43 minutes spent on chart review, discussion with nursing staff, consultants, updating family and interview/physical exam; more than 50% of that time was spent in counseling and/or coordination of care.    Ileah Falkenstein J [place] DO [facility] Available via Epic secure chat 7am-7pm After these hours, please refer to coverage provider listed on [email or web address] 02/04/2021, 11:28 AM

## 2021-02-05 ENCOUNTER — Inpatient Hospital Stay: Payer: Medicare HMO

## 2021-02-05 LAB — PROCALCITONIN: Procalcitonin: 3.82 ng/mL

## 2021-02-05 LAB — BASIC METABOLIC PANEL
Anion gap: 6 (ref 5–15)
BUN: 26 mg/dL — ABNORMAL HIGH (ref 8–23)
CO2: 24 mmol/L (ref 22–32)
Calcium: 8.4 mg/dL — ABNORMAL LOW (ref 8.9–10.3)
Chloride: 106 mmol/L (ref 98–111)
Creatinine, Ser: 1.01 mg/dL — ABNORMAL HIGH (ref 0.44–1.00)
GFR, Estimated: 54 mL/min — ABNORMAL LOW (ref >60)
Glucose, Bld: 96 mg/dL (ref 70–99)
Potassium: 3.9 mmol/L (ref 3.5–5.1)
Sodium: 136 mmol/L (ref 135–145)

## 2021-02-05 LAB — CBC
HCT: 30 % — ABNORMAL LOW (ref 36.0–46.0)
Hemoglobin: 9.2 g/dL — ABNORMAL LOW (ref 12.0–15.0)
MCH: 25.8 pg — ABNORMAL LOW (ref 26.0–34.0)
MCHC: 30.7 g/dL (ref 30.0–36.0)
MCV: 84.3 fL (ref 80.0–100.0)
Platelets: 230 10*3/uL (ref 150–400)
RBC: 3.56 MIL/uL — ABNORMAL LOW (ref 3.87–5.11)
RDW: 22.4 % — ABNORMAL HIGH (ref 11.5–15.5)
WBC: 7.6 10*3/uL (ref 4.0–10.5)
nRBC: 0 % (ref 0.0–0.2)

## 2021-02-05 MED ORDER — FUROSEMIDE 10 MG/ML IJ SOLN
40.0000 mg | Freq: Two times a day (BID) | INTRAMUSCULAR | Status: DC
  Administered 2021-02-05 (×2): 40 mg via INTRAVENOUS
  Filled 2021-02-05 (×2): qty 4

## 2021-02-05 NOTE — Progress Notes (Signed)
Pt found incontinent and soiled. Pt changed by Probation officer and given partial bed bath. Pt placed in new gown and chuck. Pt given new external cath. Pt clean and dry and given warm blankets. No needs at this time. Lights dimmed for comfort, fall alarm on, bed in lowest position and call bell within reach.

## 2021-02-05 NOTE — Progress Notes (Signed)
Hand off report given at the bedside by previous RN Trayon Krantz. Pt A&Ox4 and in NAD at time of report. Pt stable and without complaints. Pt has a notable cough but denies any need for intervention related to such.

## 2021-02-05 NOTE — Progress Notes (Signed)
PROGRESS NOTE    [name]  IWO:[id number] DOB: February 04, 1931 DOA: 02/02/2021 PCP: [name], MD    Brief Narrative:  Frances Kent is an 87 year old female with past medical history significant for essential hypertension, hyperlipidemia, CVA, GERD, hypothyroidism, depression/anxiety, interstitial lung disease, anemia, SSS, SVT, history of pulmonary embolism, proximal atrial fibrillation on Xarelto, mitral valve prolapse, chronic systolic/diastolic congestive heart failure, bilateral carotid artery stenosis who presents to [facility] ED via EMS on 12/20 with progressive shortness of breath.  Patient also reports productive cough with associated fever/chills.  Denies chest pain, no nausea/vomiting/diarrhea, no abdominal pain, no symptoms of urinary tract infection.  On EMS arrival, patient was noted to be desaturating to the 80s with severe respiratory distress, accessory muscle use and placed on CPAP; and subsequently transported to the ED for further evaluation.  In the ED, temperature 100.3 F, HR 83, RR 26, BP 168/78, SPO2 100% on BiPAP with FiO2 60%.  VBG with pH 7.37, PCO2 31, PO2 32.0.  Sodium 133, potassium 4.3, chloride 105, CO2 18, glucose 125, BUN 20, creatinine 1.24.  Total bilirubin 2.8.  BNP elevated 798.7.  Lactic acid 3.0.  WBC 10.8, hemoglobin 10.1, platelets 259.  Procalcitonin 3.17.  COVID-19 PCR negative.  Influenza A/B PCR negative.  Blood cultures x2 ordered.  Chest x-ray with multifocal pneumonia right chest with right-sided effusion, cardiomegaly with vascular congestion.  Patient was started on empiric antibiotics by EDP.  Hospital service consulted for further evaluation management of acute respite failure secondary to pneumonia.    Assessment & Plan:   Principal Problem:   HCAP (healthcare-associated pneumonia) Active Problems:   History of pulmonary embolism   Atrial fibrillation status post cardioversion [facility])   Hypothyroidism due to acquired atrophy of  thyroid   Chronic systolic CHF (congestive heart failure) (HCC)   Essential hypertension   Stage 3a chronic kidney disease (HCC)   Acute respiratory failure with hypoxia (HCC)   ILD (interstitial lung disease) (HCC)   CVA (cerebral vascular accident) ([place])   Iron deficiency anemia   Severe sepsis ([place])   Depression with anxiety   Sepsis, POA Lactic acidosis: Resolved Community acquired pneumonia, suspect atypical versus gram-negative organism Acute respiratory failure with hypoxia, POA Patient presenting to the ED with progressive shortness of breath, fever/chills and found to be hypoxic on EMS arrival with SPO2 in the 80s.  Patient was confused with associated metabolic encephalopathy and lactic acid elevated at 3.0 likely secondary to commune acquired pneumonia suspicious for atypical versus gram-negative organism.  MRSA PCR negative.  Initially requiring BiPAP to maintain oxygenation. --lactic acid 3.0>1.8 --WBC 10.8>8.3>9.1>7.6 --Procalcitonin 3.17>5.90>3.82 --Blood cultures x2: no growth x 2 days --Strep pneumonia/Legionella urinary antigen: Pending --Azithromycin 500 mg p.o. daily --Cefepime 2 g IV every 12 hours --Mucinex twice daily --Incentive spirometry --Flutter valve --Continue supplemental oxygen, maintain SPO2 > 92%, BiPAP titrated off now to nasal cannula at 3 L --BiPAP nightly --Repeat chest x-ray in the a.m.  Acute metabolic encephalopathy, POA Upon presentation, patient was noted to be confused.  Etiology likely secondary to sepsis from underlying pneumonia with hypoxia.  Patient was started on BiPAP with improvement of SPO2 and empiric antibiotics with resolution of encephalopathy. --Continue supportive care  Hyperlipidemia --Not on statin outpatient  Hypothyroidism --Levothyroxine 50 mcg p.o. daily  Depression/anxiety --Alprazolam 0.25 mg qHS PRN  Hx Interstitial lung disease --Albuterol neb as needed --Supplemental oxygen as above --Supportive  care --Outpatient follow-up with pulmonology  Anemia --Ferrous sulfate 325 mg p.o. twice daily  Hx pulmonary embolism Paroxysmal atrial fibrillation  --Amiodarone 100 mg p.o. twice daily --Xarelto 15 mg p.o. daily  Essential hypertension Chronic systolic/diastolic congestive heart failure On furosemide 40 mg p.o. daily at home.  Follows with cardiology outpatient, Dr. [name]. On presentation, admitted with sepsis as above with chest x-ray with pulmonary vascular congestion and elevated BNP of 798.7.  TTE 11/01/2020 with LVEF 40-45%, LV mildly decreased function, no LV regional wall motion abnormalities, LA/RA moderately dilated, moderate/severe MR, moderate TR, no aortic stenosis, IVC normal in size. --Lasix 40mg  IV q12h --Strict I's and O's and daily weights  Bilateral carotid artery stenosis Outpatient follow-up with cardiology/vascular surgery  Weakness/deconditioning/debility: --PT/OT recommending home health   DVT prophylaxis:  Rivaroxaban (XARELTO) tablet 15 mg    Code Status: Partial Code Family Communication: No family present at bedside this morning, updated daughter extensively yesterday afternoon at bedside.  Disposition Plan:  Level of care: Progressive Status is: Inpatient  Remains inpatient appropriate because: Continues on IV antibiotics    Consultants:  None  Procedures:  BiPAP  Antimicrobials:  Vancomycin 12/20 - 12/21 Cefepime 12/20 - 12/21; 12/22>> Azithromycin 12/21>> Ceftriaxone 12/21 - 12/21   Subjective: Patient seen examined at bedside, resting comfortably.  Patient states shortness of breath slightly improved today, feels less weak.  Wants to get out of bed.  No family present at bedside.  No other complaints or concerns at this time.  Denies headache, no visual changes, no chest pain, no palpitations, no fever/chills/night sweats, no nausea/vomiting/diarrhea, no congestion, no paresthesias.  No acute events overnight per nursing staff.     Objective: Vitals:   02/05/21 0047 02/05/21 0422 02/05/21 0743 02/05/21 1130  BP: (!) 141/76 116/63 140/78 138/71  Pulse: 89 84 88 86  Resp: 16 20 17 17   Temp: 98.6 F (37 C) 99.4 F (37.4 C) 98.8 F (37.1 C)   TempSrc:      SpO2: 96% 98% 97% 99%  Weight:  50.3 kg    Height:        Intake/Output Summary (Last 24 hours) at 02/05/2021 1236 Last data filed at 02/05/2021 1047 Gross per 24 hour  Intake 975.22 ml  Output 1200 ml  Net -224.78 ml   Filed Weights   02/04/21 0424 02/04/21 0426 02/05/21 0422  Weight: 52.2 kg 52.2 kg 50.3 kg    Examination:  General exam: Appears calm and comfortable, ill in appearance Respiratory system: Decreased breath sounds bilateral bases with mild crackles, normal respiratory effort without accessory muscle use, on 3 L nasal cannula with SPO2 98% at rest Cardiovascular system: S1 & S2 heard, RRR. No JVD, murmurs, rubs, gallops or clicks. No pedal edema. Gastrointestinal system: Abdomen is nondistended, soft and nontender. No organomegaly or masses felt. Normal bowel sounds heard. Central nervous system: Alert and oriented. No focal neurological deficits. Extremities: Symmetric 5 x 5 power. Skin: No rashes, lesions or ulcers Psychiatry: Judgement and insight appear normal. Mood & affect appropriate.     Data Reviewed: I have personally reviewed following labs and imaging studies  CBC: Recent Labs  Lab 02/02/21 0927 02/03/21 0709 02/04/21 0643 02/05/21 0458  WBC 10.8* 8.3 9.1 7.6  NEUTROABS 9.3*  --   --   --   HGB 10.1* 9.5* 9.9* 9.2*  HCT 32.1* 31.0* 31.7* 30.0*  MCV 85.1 84.0 83.4 84.3  PLT 259 227 264 194   Basic Metabolic Panel: Recent Labs  Lab 02/02/21 0927 02/03/21 0709 02/04/21 0643 02/05/21 0458  NA 133* 136 137 136  K 4.3 5.1 4.6 3.9  CL 105 109 109 106  CO2 18* 20* 20* 24  GLUCOSE 125* 146* 133* 96  BUN 20 25* 29* 26*  CREATININE 1.24* 0.93 0.96 1.01*  CALCIUM 9.1 8.8* 9.0 8.4*   GFR: Estimated  Creatinine Clearance: 30.6 mL/min (A) (by C-G formula based on SCr of 1.01 mg/dL (H)). Liver Function Tests: Recent Labs  Lab 02/02/21 0927  AST 36  ALT 31  ALKPHOS 102  BILITOT 2.8*  PROT 7.0  ALBUMIN 3.5   No results for input(s): LIPASE, AMYLASE in the last 168 hours. No results for input(s): AMMONIA in the last 168 hours. Coagulation Profile: No results for input(s): INR, PROTIME in the last 168 hours. Cardiac Enzymes: No results for input(s): CKTOTAL, CKMB, CKMBINDEX, TROPONINI in the last 168 hours. BNP (last 3 results) No results for input(s): PROBNP in the last 8760 hours. HbA1C: No results for input(s): HGBA1C in the last 72 hours. CBG: No results for input(s): GLUCAP in the last 168 hours. Lipid Profile: No results for input(s): CHOL, HDL, LDLCALC, TRIG, CHOLHDL, LDLDIRECT in the last 72 hours. Thyroid Function Tests: No results for input(s): TSH, T4TOTAL, FREET4, T3FREE, THYROIDAB in the last 72 hours. Anemia Panel: No results for input(s): VITAMINB12, FOLATE, FERRITIN, TIBC, IRON, RETICCTPCT in the last 72 hours. Sepsis Labs: Recent Labs  Lab 02/02/21 0926 02/02/21 0927 02/02/21 1139 02/04/21 0643 02/05/21 0458  PROCALCITON  --  3.17  --  5.90 3.82  LATICACIDVEN 3.0*  --  1.8  --   --     Recent Results (from the past 240 hour(s))  Resp Panel by RT-PCR (Flu A&B, Covid) Nasopharyngeal Swab     Status: None   Collection Time: 02/02/21  9:27 AM   Specimen: Nasopharyngeal Swab; Nasopharyngeal(NP) swabs in vial transport medium  Result Value Ref Range Status   SARS Coronavirus 2 by RT PCR NEGATIVE NEGATIVE Final    Comment: (NOTE) SARS-CoV-2 target nucleic acids are NOT DETECTED.  The SARS-CoV-2 RNA is generally detectable in upper respiratory specimens during the acute phase of infection. The lowest concentration of SARS-CoV-2 viral copies this assay can detect is 138 copies/mL. A negative result does not preclude SARS-Cov-2 infection and should not be  used as the sole basis for treatment or other patient management decisions. A negative result may occur with  improper specimen collection/handling, submission of specimen other than nasopharyngeal swab, presence of viral mutation(s) within the areas targeted by this assay, and inadequate number of viral copies(<138 copies/mL). A negative result must be combined with clinical observations, patient history, and epidemiological information. The expected result is Negative.  Fact Sheet for Patients:  EntrepreneurPulse.com.au  Fact Sheet for Healthcare Providers:  IncredibleEmployment.be  This test is no t yet approved or cleared by the [place] FDA and  has been authorized for detection and/or diagnosis of SARS-CoV-2 by FDA under an Emergency Use Authorization (EUA). This EUA will remain  in effect (meaning this test can be used) for the duration of the COVID-19 declaration under Section 564(b)(1) of the Act, 21 U.S.C.section 360bbb-3(b)(1), unless the authorization is terminated  or revoked sooner.       Influenza A by PCR NEGATIVE NEGATIVE Final   Influenza B by PCR NEGATIVE NEGATIVE Final    Comment: (NOTE) The Xpert Xpress SARS-CoV-2/FLU/RSV plus assay is intended as an aid in the diagnosis of influenza from Nasopharyngeal swab specimens and should not be used as a sole basis for treatment. Nasal washings and aspirates are  unacceptable for Xpert Xpress SARS-CoV-2/FLU/RSV testing.  Fact Sheet for Patients: EntrepreneurPulse.com.au  Fact Sheet for Healthcare Providers: IncredibleEmployment.be  This test is not yet approved or cleared by the [place] FDA and has been authorized for detection and/or diagnosis of SARS-CoV-2 by FDA under an Emergency Use Authorization (EUA). This EUA will remain in effect (meaning this test can be used) for the duration of the COVID-19 declaration under Section  564(b)(1) of the Act, 21 U.S.C. section 360bbb-3(b)(1), unless the authorization is terminated or revoked.  Performed at [facility], [address] [place], [place] [address]   Blood culture (routine x 2)     Status: None (Preliminary result)   Collection Time: 02/02/21 10:31 AM   Specimen: BLOOD  Result Value Ref Range Status   Specimen Description BLOOD RIGHT FA  Final   Special Requests   Final    BOTTLES DRAWN AEROBIC AND ANAEROBIC Blood Culture adequate volume   Culture   Final    NO GROWTH 3 DAYS Performed at [facility], [address], [place], [place] [address]    Report Status PENDING  Incomplete  Blood culture (routine x 2)     Status: None (Preliminary result)   Collection Time: 02/02/21 10:31 AM   Specimen: BLOOD  Result Value Ref Range Status   Specimen Description BLOOD RIGHT HAND  Final   Special Requests   Final    BOTTLES DRAWN AEROBIC AND ANAEROBIC Blood Culture adequate volume   Culture   Final    NO GROWTH 3 DAYS Performed at [facility], [address] [place] [address], [place], [place] [address]    Report Status PENDING  Incomplete  MRSA Next Gen by PCR, Nasal     Status: None   Collection Time: 02/03/21  9:32 AM   Specimen: Nasal Mucosa; Nasal Swab  Result Value Ref Range Status   MRSA by PCR Next Gen NOT DETECTED NOT DETECTED Final    Comment: (NOTE) The GeneXpert MRSA Assay (FDA approved for NASAL specimens only), is one component of a comprehensive MRSA colonization surveillance program. It is not intended to diagnose MRSA infection nor to guide or monitor treatment for MRSA infections. Test performance is not FDA approved in patients less than 57 years old. Performed at [facility], [address], [place], [place] [address]          Radiology Studies: [facility] Chest [place] 1 View  Result Date: 02/05/2021 CLINICAL DATA:  Shortness of breath. EXAM: PORTABLE CHEST 1 VIEW COMPARISON:  Chest x-ray dated February 02, 2021. FINDINGS: Stable cardiomegaly. Patchy consolidation in the right lower lobe has mildly improved. Unchanged small right pleural effusion. No pneumothorax. No acute osseous abnormality. IMPRESSION: 1. Mildly improved right lower lobe pneumonia. Electronically Signed   By: Titus Dubin M.D.   On: 02/05/2021 08:09        Scheduled Meds:  amiodarone  100 mg Oral BID   ferrous sulfate  325 mg Oral BID   furosemide  40 mg Intravenous BID   guaiFENesin  600 mg Oral BID   levothyroxine  50 mcg Oral Q0600   mirtazapine  7.5 mg Oral QHS   multivitamin with minerals  1 tablet Oral Daily   Rivaroxaban  15 mg Oral Q supper   Continuous Infusions:  sodium chloride 10 mL/hr at 02/05/21 0326   ceFEPime (MAXIPIME) IV 2 g (02/05/21 1033)     LOS: 3 days    Time spent: 41 minutes spent on chart review,  discussion with nursing staff, consultants, updating family and interview/physical exam; more than 50% of that time was spent in counseling and/or coordination of care.    Imaad Reuss J [place] DO [facility] Available via Epic secure chat 7am-7pm After these hours, please refer to coverage provider listed on [email or web address] 02/05/2021, 12:36 PM

## 2021-02-05 NOTE — Consult Note (Signed)
Pharmacy Antibiotic Note  Frances Kent is a 87 y.o. female with PMH of paroxysmal atrial fibrillation, GERD, hypertension, hypothyroidism, CHF, PE and SVT who was admitted on 02/02/2021 with pneumonia.  Pharmacy has been consulted for cefepime dosing.  Plan: Day 3 of abx. Changed to ceftriaxone 12/21 but plan to change back to cefepime 2 g q12H to due patient's resp status worsening.   Height: 5\' 3"  (160 cm) Weight: 50.3 kg (110 lb 12.8 oz) IBW/kg (Calculated) : 52.4  Temp (24hrs), Avg:98.9 F (37.2 C), Min:98.4 F (36.9 C), Max:99.4 F (37.4 C)  Recent Labs  Lab 02/02/21 0926 02/02/21 0927 02/02/21 1139 02/03/21 0709 02/04/21 0643 02/05/21 0458  WBC  --  10.8*  --  8.3 9.1 7.6  CREATININE  --  1.24*  --  0.93 0.96 1.01*  LATICACIDVEN 3.0*  --  1.8  --   --   --      Estimated Creatinine Clearance: 30.6 mL/min (A) (by C-G formula based on SCr of 1.01 mg/dL (H)).    Allergies  Allergen Reactions   Statins     Severe myalgias    Antimicrobials this admission: Vancomycin 12/20 >> 12/21 Cefepime 12/20 >>   Microbiology results: 12/20 BCx: pending 12/20 MRSA PCR: negative  Thank you for allowing pharmacy to be a part of this patients care.  Oswald Hillock, PharmD 02/05/2021 10:21 AM

## 2021-02-05 NOTE — Care Management Important Message (Signed)
Important Message  Patient Details  Name: Frances Kent MRN: [id number] Date of Birth: [birth year]   Medicare Important Message Given:  Yes  I reviewed the Important Message from Medicare with POA, [name] by phone [phone]). She is in agreement with the discharge plan and I asked if she would like me to send her a copy and she said it wasn't necessary as she received a copy at admission. I thanked her for her time.   Juliann Pulse A Aser Nylund 02/05/2021, 1:54 PM

## 2021-02-05 NOTE — Progress Notes (Signed)
Mobility Specialist - Progress Note   02/05/21 1100  Mobility  Activity Transferred:  Chair to bed  Level of Assistance Minimal assist, patient does 75% or more  Assistive Device Front wheel walker  Distance Ambulated (ft) 2 ft  Mobility Sit up in bed/chair position for meals;Ambulated with assistance in room  Mobility Response Tolerated well  Mobility performed by Mobility specialist  $Mobility charge 1 Mobility    Pt sitting in recliner upon arrival, requesting assistance to get back to bed. MinA for transfer with vc for sequencing. Pt left in bed with alarm set, family at bedside.    Kathee Delton Mobility Specialist 02/05/21, 11:22 AM

## 2021-02-05 NOTE — Progress Notes (Signed)
Physical Therapy Treatment Patient Details Name: Frances Kent MRN: [id number] DOB: [birth year] Today's Date: 02/05/2021   History of Present Illness Pt. is a 87 y.o. female with medical history significant of hypertension, hyperlipidemia, stroke, GERD, hypothyroidism, depression with anxiety, interstitial lung disease, anemia, SSS, SVT, PE and atrial fibrillation on Xarelto, mitral valve prolapse, CHF with EF of 40-45%, bilateral carotid artery stenosis, who presents with shortness of breath.    PT Comments    Pt received supine in bed agreeable to PT. Performing LE therex in supine and seated positions with great form/technique. Initially resting on 3L/min with SPO2 at 98-100%. Did require minA today to get to EOB, but otherwise able to stand with supervision and VC's for hand placement and amb 40' with RW on 1L/min. SPO2 remained >95% throughout with RN notified.  Max HR with ambulation at 96 BPM. Pt does appear fatigued post session in recliner post ambulation and therex. Pt resting in recliner with all needs in reach with family present. PT still appropriate to return home and [facility] PT.   Recommendations for follow up therapy are one component of a multi-disciplinary discharge planning process, led by the attending physician.  Recommendations may be updated based on patient status, additional functional criteria and insurance authorization.  Follow Up Recommendations  Home health PT     Assistance Recommended at Discharge Frequent or constant Supervision/Assistance  Equipment Recommendations  None recommended by PT    Recommendations for Other Services       Precautions / Restrictions Precautions Precautions: Fall Restrictions Weight Bearing Restrictions: No     Mobility  Bed Mobility Overal bed mobility: Needs Assistance Bed Mobility: Supine to Sit     Supine to sit: [facility] elevated;Min assist     General bed mobility comments: In recliner post session Patient Response:  Cooperative  Transfers Overall transfer level: Needs assistance Equipment used: Rolling walker (2 wheels) Transfers: Sit to/from Stand Sit to Stand: Min guard           General transfer comment: VC's for hand placement    Ambulation/Gait Ambulation/Gait assistance: Supervision Gait Distance (Feet): 40 Feet Assistive device: Rolling walker (2 wheels) Gait Pattern/deviations: Step-through pattern;Decreased step length - right;Decreased step length - left;Decreased stride length;Trunk flexed       General Gait Details: Increased time to perform. Pt improved ability to keep RW closer to BOS.   Stairs             Wheelchair Mobility    Modified Rankin (Stroke Patients Only)       Balance Overall balance assessment: Needs assistance Sitting-balance support: No upper extremity supported;Feet supported Sitting balance-Leahy Scale: Good     Standing balance support: Bilateral upper extremity supported;During functional activity;Reliant on assistive device for balance Standing balance-Leahy Scale: Fair                              Cognition Arousal/Alertness: Awake/alert Behavior During Therapy: WFL for tasks assessed/performed Overall Cognitive Status: Within Functional Limits for tasks assessed                                          Exercises General Exercises - Lower Extremity Ankle Circles/Pumps: AROM;Supine;Both;20 reps Long Arc Quad: AROM;Seated;Strengthening;Both;10 reps Heel Slides: AROM;Supine;Strengthening;Both;10 reps Hip ABduction/ADduction: AROM;Supine;Strengthening;Both;10 reps Straight Leg Raises: AROM;Supine;Both;10 reps Hip Flexion/Marching: Seated;AROM;Strengthening;Both;10 reps  General Comments General comments (skin integrity, edema, etc.): Abe to tolerate ambulation and seated therex on 1 L/min with SPo2 >95% throughout. HR up to 96 BPM with ambulation.      Pertinent Vitals/Pain Pain Assessment:  No/denies pain    Home Living                          Prior Function            PT Goals (current goals can now be found in the care plan section) Acute Rehab PT Goals Patient Stated Goal: to return home Time For Goal Achievement: 02/17/21 Potential to Achieve Goals: Good Progress towards PT goals: Progressing toward goals    Frequency    Min 2X/week      PT Plan Current plan remains appropriate    Co-evaluation              AM-PAC PT "6 Clicks" Mobility   Outcome Measure  Help needed turning from your back to your side while in a flat bed without using bedrails?: A Little Help needed moving from lying on your back to sitting on the side of a flat bed without using bedrails?: A Little Help needed moving to and from a bed to a chair (including a wheelchair)?: A Little Help needed standing up from a chair using your arms (e.g., wheelchair or bedside chair)?: A Little Help needed to walk in hospital room?: A Little Help needed climbing 3-5 steps with a railing? : A Lot 6 Click Score: 17    End of Session Equipment Utilized During Treatment: Gait belt;Oxygen Activity Tolerance: Patient tolerated treatment well Patient left: in chair;with call bell/phone within reach;with family/visitor present;with chair alarm set Nurse Communication: Mobility status PT Visit Diagnosis: Difficulty in walking, not elsewhere classified (R26.2);Muscle weakness (generalized) (M62.81)     Time: 8832-5498 PT Time Calculation (min) (ACUTE ONLY): 30 min  Charges:  $Gait Training: 8-22 mins $Therapeutic Exercise: 8-22 mins                    Gracyn Allor M. Fairly IV, PT, DPT Physical Therapist- [facility]  02/05/2021, 12:05 PM

## 2021-02-06 LAB — CBC
HCT: 34.5 % — ABNORMAL LOW (ref 36.0–46.0)
Hemoglobin: 10.7 g/dL — ABNORMAL LOW (ref 12.0–15.0)
MCH: 26.2 pg (ref 26.0–34.0)
MCHC: 31 g/dL (ref 30.0–36.0)
MCV: 84.6 fL (ref 80.0–100.0)
Platelets: 270 10*3/uL (ref 150–400)
RBC: 4.08 MIL/uL (ref 3.87–5.11)
RDW: 21.9 % — ABNORMAL HIGH (ref 11.5–15.5)
WBC: 7.3 10*3/uL (ref 4.0–10.5)
nRBC: 0 % (ref 0.0–0.2)

## 2021-02-06 LAB — BASIC METABOLIC PANEL WITH GFR
Anion gap: 8 (ref 5–15)
BUN: 31 mg/dL — ABNORMAL HIGH (ref 8–23)
CO2: 27 mmol/L (ref 22–32)
Calcium: 8.5 mg/dL — ABNORMAL LOW (ref 8.9–10.3)
Chloride: 103 mmol/L (ref 98–111)
Creatinine, Ser: 1.24 mg/dL — ABNORMAL HIGH (ref 0.44–1.00)
GFR, Estimated: 42 mL/min — ABNORMAL LOW
Glucose, Bld: 102 mg/dL — ABNORMAL HIGH (ref 70–99)
Potassium: 3.2 mmol/L — ABNORMAL LOW (ref 3.5–5.1)
Sodium: 138 mmol/L (ref 135–145)

## 2021-02-06 LAB — PROCALCITONIN: Procalcitonin: 2.73 ng/mL

## 2021-02-06 MED ORDER — ENSURE ENLIVE PO LIQD
237.0000 mL | Freq: Two times a day (BID) | ORAL | Status: DC
  Administered 2021-02-06 – 2021-02-08 (×4): 237 mL via ORAL

## 2021-02-06 MED ORDER — POTASSIUM CHLORIDE CRYS ER 20 MEQ PO TBCR
40.0000 meq | EXTENDED_RELEASE_TABLET | ORAL | Status: AC
  Administered 2021-02-06 (×2): 40 meq via ORAL
  Filled 2021-02-06 (×2): qty 2

## 2021-02-06 MED ORDER — BENZONATATE 100 MG PO CAPS
200.0000 mg | ORAL_CAPSULE | Freq: Three times a day (TID) | ORAL | Status: DC
  Administered 2021-02-06 – 2021-02-10 (×12): 200 mg via ORAL
  Filled 2021-02-06 (×13): qty 2

## 2021-02-06 MED ORDER — FUROSEMIDE 20 MG PO TABS
20.0000 mg | ORAL_TABLET | Freq: Every day | ORAL | Status: DC
Start: 2021-02-06 — End: 2021-02-07
  Administered 2021-02-06 – 2021-02-07 (×2): 20 mg via ORAL
  Filled 2021-02-06 (×2): qty 1

## 2021-02-06 MED ORDER — SODIUM CHLORIDE 0.9 % IV SOLN
2.0000 g | INTRAVENOUS | Status: DC
  Administered 2021-02-07 – 2021-02-08 (×2): 2 g via INTRAVENOUS
  Filled 2021-02-06 (×2): qty 2

## 2021-02-06 NOTE — Progress Notes (Signed)
Occupational Therapy Treatment Patient Details Name: Frances Kent MRN: [id number] DOB: Jul 16, 1930 Today's Date: 02/06/2021   History of present illness Pt. is a 87 y.o. female with medical history significant of hypertension, hyperlipidemia, stroke, GERD, hypothyroidism, depression with anxiety, interstitial lung disease, anemia, SSS, SVT, PE and atrial fibrillation on Xarelto, mitral valve prolapse, CHF with EF of 40-45%, bilateral carotid artery stenosis, who presents with shortness of breath.   OT comments  Pt seen for OT treatment on this date. Upon arrival to room, pt awake and seated upright in bed, reporting no pain, with daughter present. Pt endorsed feeling "worse than yesterday" but was agreeable to OT tx. Pt currently requires SUPERVISION for sit>stand transfers, MIN GUARD for sit<>stand transfers, MIN GUARD for functional mobility of short household distances (~15 ft) wit hRW, and SUPERVISION/SET-UP for seated grooming tasks. Pt encouraged to attempt grooming tasks while standing, however pt unable to engage in standing grooming tasks d/t decreased activity tolerance and strength. Pt is making good progress toward goals and continues to benefit from skilled OT services to maximize return to PLOF and minimize risk of future falls, injury, caregiver burden, and readmission. Will continue to follow POC. Discharge recommendation remains appropriate.     Recommendations for follow up therapy are one component of a multi-disciplinary discharge planning process, led by the attending physician.  Recommendations may be updated based on patient status, additional functional criteria and insurance authorization.    Follow Up Recommendations  Home health OT    Assistance Recommended at Discharge Intermittent Supervision/Assistance  Equipment Recommendations  None recommended by OT       Precautions / Restrictions Precautions Precautions: Fall Restrictions Weight Bearing Restrictions: No        Mobility Bed Mobility Overal bed mobility: Needs Assistance Bed Mobility: Supine to Sit     Supine to sit: Supervision;HOB elevated     General bed mobility comments: With use of bedrails, pt require no physical assist    Transfers Overall transfer level: Needs assistance Equipment used: Rolling walker (2 wheels) Transfers: Sit to/from Stand Sit to Stand: Min guard           General transfer comment: VC's for hand placement     Balance Overall balance assessment: Needs assistance Sitting-balance support: No upper extremity supported;Feet supported Sitting balance-Leahy Scale: Good Sitting balance - Comments: Good sitting balance reaching within BOS at EOB   Standing balance support: Bilateral upper extremity supported;During functional activity;Reliant on assistive device for balance Standing balance-Leahy Scale: Fair                             ADL either performed or assessed with clinical judgement   ADL Overall ADL's : Needs assistance/impaired     Grooming: Wash/dry face;Oral care;Supervision/safety;Set up;Sitting                               Functional mobility during ADLs: Min guard;Rolling walker (2 wheels)      Extremity/Trunk Assessment Upper Extremity Assessment Upper Extremity Assessment: Generalized weakness   Lower Extremity Assessment Lower Extremity Assessment: Generalized weakness         Cognition Arousal/Alertness: Awake/alert Behavior During Therapy: WFL for tasks assessed/performed Overall Cognitive Status: Within Functional Limits for tasks assessed  Pertinent Vitals/ Pain       Pain Assessment: No/denies pain         Frequency  Min 2X/week        Progress Toward Goals  OT Goals(current goals can now be found in the care plan section)  Progress towards OT goals: Progressing toward goals  Acute Rehab OT  Goals Patient Stated Goal: to go home OT Goal Formulation: With patient Time For Goal Achievement: 02/18/21 Potential to Achieve Goals: Good  Plan Discharge plan remains appropriate;Frequency remains appropriate       AM-PAC OT "6 Clicks" Daily Activity     Outcome Measure   Help from another person eating meals?: None Help from another person taking care of personal grooming?: A Little Help from another person toileting, which includes using toliet, bedpan, or urinal?: A Little Help from another person bathing (including washing, rinsing, drying)?: A Little Help from another person to put on and taking off regular upper body clothing?: A Little Help from another person to put on and taking off regular lower body clothing?: A Little 6 Click Score: 19    End of Session Equipment Utilized During Treatment: Rolling walker (2 wheels)  OT Visit Diagnosis: Muscle weakness (generalized) (M62.81)   Activity Tolerance Patient tolerated treatment well   Patient Left in chair;with call bell/phone within reach;with chair alarm set;with family/visitor present   Nurse Communication Mobility status        Time: 1440-1507 OT Time Calculation (min): 27 min  Charges: OT General Charges $OT Visit: 1 Visit OT Treatments $Self Care/Home Management : 23-37 mins  Fredirick Maudlin, OTR/L [place]

## 2021-02-06 NOTE — Progress Notes (Signed)
PROGRESS NOTE    Frances Kent  KGY:[id number] DOB: [birth year] DOA: 02/02/2021 PCP: [name], MD    Brief Narrative:  87 year old female with past medical history significant for essential hypertension, hyperlipidemia, CVA, GERD, hypothyroidism, depression/anxiety, interstitial lung disease, anemia, SSS, SVT, history of pulmonary embolism, proximal atrial fibrillation on Xarelto, mitral valve prolapse, chronic systolic/diastolic congestive heart failure, bilateral carotid artery stenosis who presents to [facility] ED via EMS on 12/20 with progressive shortness of breath.  Patient also reports productive cough with associated fever/chills.  Denies chest pain, no nausea/vomiting/diarrhea, no abdominal pain, no symptoms of urinary tract infection.  On EMS arrival, patient was noted to be desaturating to the 80s with severe respiratory distress, accessory muscle use and placed on CPAP; and subsequently transported to the ED for further evaluation.   In the ED, temperature 100.3 F, HR 83, RR 26, BP 168/78, SPO2 100% on BiPAP with FiO2 60%.  VBG with pH 7.37, PCO2 31, PO2 32.0.  Sodium 133, potassium 4.3, chloride 105, CO2 18, glucose 125, BUN 20, creatinine 1.24.  Total bilirubin 2.8.  BNP elevated 798.7.  Lactic acid 3.0.  WBC 10.8, hemoglobin 10.1, platelets 259.  Procalcitonin 3.17.  COVID-19 PCR negative.  Influenza A/B PCR negative.  Blood cultures x2 ordered.  Chest x-ray with multifocal pneumonia right chest with right-sided effusion, cardiomegaly with vascular congestion.  Patient was started on empiric antibiotics by EDP.  Hospital service consulted for further evaluation management of acute respiratory failure secondary to pneumonia   Assessment & Plan:   Principal Problem:   HCAP (healthcare-associated pneumonia) Active Problems:   History of pulmonary embolism   Atrial fibrillation status post cardioversion [facility])   Hypothyroidism due to acquired atrophy of thyroid   Chronic systolic  CHF (congestive heart failure) (HCC)   Essential hypertension   Stage 3a chronic kidney disease (HCC)   Acute respiratory failure with hypoxia (HCC)   ILD (interstitial lung disease) (HCC)   CVA (cerebral vascular accident) ([place])   Iron deficiency anemia   Severe sepsis ([place])   Depression with anxiety  Sepsis, POA Lactic acidosis: Resolved Community acquired pneumonia, suspect atypical versus gram-negative organism Acute respiratory failure with hypoxia, POA Patient presenting to the ED with progressive shortness of breath, fever/chills and found to be hypoxic on EMS arrival with SPO2 in the 80s.  Patient was confused with associated metabolic encephalopathy and lactic acid elevated at 3.0 likely secondary to commune acquired pneumonia suspicious for atypical versus gram-negative organism.  MRSA PCR negative.  Initially requiring BiPAP to maintain oxygenation. Weaned off BiPAP.  On 2 L.  Encephalopathy improved. --lactic acid 3.0>1.8 --WBC 10.8>8.3>9.1>7.6 --Procalcitonin 3.17>5.90>3.82 --Blood cultures x2: no growth x 2 days --Strep pneumonia/Legionella urinary antigen: Pending Plan: Continue IV cefepime Limit to 5-day course Completed azithromycin Antitussives and mucolytic's as needed Incentive spirometry and flutter valve Nightly BiPAP Wean nasal cannula during the day Monitor vitals and fever curve Possible discharge in 24 to 48 hours if able to wean from oxygen   Acute metabolic encephalopathy, POA, improved Upon presentation, patient was noted to be confused.  Etiology likely secondary to sepsis from underlying pneumonia with hypoxia.  Patient was started on BiPAP with improvement of SPO2 and empiric antibiotics with resolution of encephalopathy. --Continue supportive care --Treatment as above  Acute kidney injury Worsening creatinine over the past 24 hours noted on 12/24 Plan: Hold IV diuresis Lasix 20 mg daily per home dose Daily BMP  Essential  hypertension Chronic systolic/diastolic congestive heart failure On furosemide  20 mg p.o. daily at home.  Follows with cardiology outpatient, Dr. [name]. On presentation, admitted with sepsis as above with chest x-ray with pulmonary vascular congestion and elevated BNP of 798.7.  TTE 11/01/2020 with LVEF 40-45%, LV mildly decreased function, no LV regional wall motion abnormalities, LA/RA moderately dilated, moderate/severe MR, moderate TR, no aortic stenosis, IVC normal in size. -No evidence of acute exacerbation Plan: DC IV diuresis P.o. Lasix 20 mg daily Strict ins and outs, daily weights    Hyperlipidemia --Not on statin outpatient   Hypothyroidism --Levothyroxine 50 mcg p.o. daily   Depression/anxiety --Alprazolam 0.25 mg qHS PRN   Hx Interstitial lung disease --Albuterol neb as needed --Supplemental oxygen as above --Supportive care --Outpatient follow-up with pulmonology   Anemia --Ferrous sulfate 325 mg p.o. twice daily   Hx pulmonary embolism Paroxysmal atrial fibrillation  --Amiodarone 100 mg p.o. twice daily --Xarelto 15 mg p.o. daily    Bilateral carotid artery stenosis Outpatient follow-up with cardiology/vascular surgery   Weakness/deconditioning/debility: --PT/OT recommending home health   DVT prophylaxis: Xarelto Code Status: Partial Family Communication: Daughter [name] [phone] on 12/24 Disposition Plan: Status is: Inpatient  Remains inpatient appropriate because: Multifocal pneumonia.  Hypoxic respiratory failure.  AKI.  Disposition anticipated in 48 hours       Level of care: Progressive  Consultants:  None  Procedures:  None  Antimicrobials: Cefepime   Subjective: Patient seen and examined.  Endorses lethargy and fatigue.  No pain complaints  Objective: Vitals:   02/06/21 0500 02/06/21 0539 02/06/21 0759 02/06/21 1205  BP:  (!) 121/57 127/61 (!) 113/58  Pulse:  78 71 70  Resp:  15 20 (!) 22  Temp:  98.2 F (36.8 C)  98.5 F (36.9 C) 98.5 F (36.9 C)  TempSrc:      SpO2:  93% 98% 100%  Weight: 49.2 kg     Height:        Intake/Output Summary (Last 24 hours) at 02/06/2021 1343 Last data filed at 02/06/2021 1004 Gross per 24 hour  Intake 756.43 ml  Output 1100 ml  Net -343.57 ml   Filed Weights   02/04/21 0426 02/05/21 0422 02/06/21 0500  Weight: 52.2 kg 50.3 kg 49.2 kg    Examination:  General exam: No acute distress.  Appears frail Respiratory system: Scattered crackles.  Normal work of breathing.  2 L Cardiovascular system: S1-S2, RRR, no murmurs, no pedal edema Gastrointestinal system: Soft, NT/ND, normal bowel sounds Central nervous system: Alert and oriented. No focal neurological deficits. Extremities: Symmetric 5 x 5 power. Skin: No rashes, lesions or ulcers Psychiatry: Judgement and insight appear normal. Mood & affect appropriate.     Data Reviewed: I have personally reviewed following labs and imaging studies  CBC: Recent Labs  Lab 02/02/21 0927 02/03/21 0709 02/04/21 0643 02/05/21 0458 02/06/21 0615  WBC 10.8* 8.3 9.1 7.6 7.3  NEUTROABS 9.3*  --   --   --   --   HGB 10.1* 9.5* 9.9* 9.2* 10.7*  HCT 32.1* 31.0* 31.7* 30.0* 34.5*  MCV 85.1 84.0 83.4 84.3 84.6  PLT 259 227 264 230 829   Basic Metabolic Panel: Recent Labs  Lab 02/02/21 0927 02/03/21 0709 02/04/21 0643 02/05/21 0458 02/06/21 0615  NA 133* 136 137 136 138  K 4.3 5.1 4.6 3.9 3.2*  CL 105 109 109 106 103  CO2 18* 20* 20* 24 27  GLUCOSE 125* 146* 133* 96 102*  BUN 20 25* 29* 26* 31*  CREATININE 1.24* 0.93 0.96 1.01*  1.24*  CALCIUM 9.1 8.8* 9.0 8.4* 8.5*   GFR: Estimated Creatinine Clearance: 24.4 mL/min (A) (by C-G formula based on SCr of 1.24 mg/dL (H)). Liver Function Tests: Recent Labs  Lab 02/02/21 0927  AST 36  ALT 31  ALKPHOS 102  BILITOT 2.8*  PROT 7.0  ALBUMIN 3.5   No results for input(s): LIPASE, AMYLASE in the last 168 hours. No results for input(s): AMMONIA in the last  168 hours. Coagulation Profile: No results for input(s): INR, PROTIME in the last 168 hours. Cardiac Enzymes: No results for input(s): CKTOTAL, CKMB, CKMBINDEX, TROPONINI in the last 168 hours. BNP (last 3 results) No results for input(s): PROBNP in the last 8760 hours. HbA1C: No results for input(s): HGBA1C in the last 72 hours. CBG: No results for input(s): GLUCAP in the last 168 hours. Lipid Profile: No results for input(s): CHOL, HDL, LDLCALC, TRIG, CHOLHDL, LDLDIRECT in the last 72 hours. Thyroid Function Tests: No results for input(s): TSH, T4TOTAL, FREET4, T3FREE, THYROIDAB in the last 72 hours. Anemia Panel: No results for input(s): VITAMINB12, FOLATE, FERRITIN, TIBC, IRON, RETICCTPCT in the last 72 hours. Sepsis Labs: Recent Labs  Lab 02/02/21 0926 02/02/21 0927 02/02/21 1139 02/04/21 0643 02/05/21 0458 02/06/21 0615  PROCALCITON  --  3.17  --  5.90 3.82 2.73  LATICACIDVEN 3.0*  --  1.8  --   --   --     Recent Results (from the past 240 hour(s))  Resp Panel by RT-PCR (Flu A&B, Covid) Nasopharyngeal Swab     Status: None   Collection Time: 02/02/21  9:27 AM   Specimen: Nasopharyngeal Swab; Nasopharyngeal(NP) swabs in vial transport medium  Result Value Ref Range Status   SARS Coronavirus 2 by RT PCR NEGATIVE NEGATIVE Final    Comment: (NOTE) SARS-CoV-2 target nucleic acids are NOT DETECTED.  The SARS-CoV-2 RNA is generally detectable in upper respiratory specimens during the acute phase of infection. The lowest concentration of SARS-CoV-2 viral copies this assay can detect is 138 copies/mL. A negative result does not preclude SARS-Cov-2 infection and should not be used as the sole basis for treatment or other patient management decisions. A negative result may occur with  improper specimen collection/handling, submission of specimen other than nasopharyngeal swab, presence of viral mutation(s) within the areas targeted by this assay, and inadequate number of  viral copies(<138 copies/mL). A negative result must be combined with clinical observations, patient history, and epidemiological information. The expected result is Negative.  Fact Sheet for Patients:  EntrepreneurPulse.com.au  Fact Sheet for Healthcare Providers:  IncredibleEmployment.be  This test is no t yet approved or cleared by the [place] FDA and  has been authorized for detection and/or diagnosis of SARS-CoV-2 by FDA under an Emergency Use Authorization (EUA). This EUA will remain  in effect (meaning this test can be used) for the duration of the COVID-19 declaration under Section 564(b)(1) of the Act, 21 U.S.C.section 360bbb-3(b)(1), unless the authorization is terminated  or revoked sooner.       Influenza A by PCR NEGATIVE NEGATIVE Final   Influenza B by PCR NEGATIVE NEGATIVE Final    Comment: (NOTE) The Xpert Xpress SARS-CoV-2/FLU/RSV plus assay is intended as an aid in the diagnosis of influenza from Nasopharyngeal swab specimens and should not be used as a sole basis for treatment. Nasal washings and aspirates are unacceptable for Xpert Xpress SARS-CoV-2/FLU/RSV testing.  Fact Sheet for Patients: EntrepreneurPulse.com.au  Fact Sheet for Healthcare Providers: IncredibleEmployment.be  This test is not yet approved or  cleared by the [place] and has been authorized for detection and/or diagnosis of SARS-CoV-2 by FDA under an Emergency Use Authorization (EUA). This EUA will remain in effect (meaning this test can be used) for the duration of the COVID-19 declaration under Section 564(b)(1) of the Act, 21 U.S.C. section 360bbb-3(b)(1), unless the authorization is terminated or revoked.  Performed at [facility], [address] [place], [place] [address]   Blood culture (routine x 2)     Status: None (Preliminary result)   Collection Time: 02/02/21 10:31 AM    Specimen: BLOOD  Result Value Ref Range Status   Specimen Description BLOOD RIGHT FA  Final   Special Requests   Final    BOTTLES DRAWN AEROBIC AND ANAEROBIC Blood Culture adequate volume   Culture   Final    NO GROWTH 4 DAYS Performed at [facility], [address], [place], [place] [address]    Report Status PENDING  Incomplete  Blood culture (routine x 2)     Status: None (Preliminary result)   Collection Time: 02/02/21 10:31 AM   Specimen: BLOOD  Result Value Ref Range Status   Specimen Description BLOOD RIGHT HAND  Final   Special Requests   Final    BOTTLES DRAWN AEROBIC AND ANAEROBIC Blood Culture adequate volume   Culture   Final    NO GROWTH 4 DAYS Performed at [facility], [address] [place], [place] [address]    Report Status PENDING  Incomplete  MRSA Next Gen by PCR, Nasal     Status: None   Collection Time: 02/03/21  9:32 AM   Specimen: Nasal Mucosa; Nasal Swab  Result Value Ref Range Status   MRSA by PCR Next Gen NOT DETECTED NOT DETECTED Final    Comment: (NOTE) The GeneXpert MRSA Assay (FDA approved for NASAL specimens only), is one component of a comprehensive MRSA colonization surveillance program. It is not intended to diagnose MRSA infection nor to guide or monitor treatment for MRSA infections. Test performance is not FDA approved in patients less than 85 years old. Performed at [facility], [address], [place], [place] [address]          Radiology Studies: [facility] Chest [place] 1 View  Result Date: 02/05/2021 CLINICAL DATA:  Shortness of breath. EXAM: PORTABLE CHEST 1 VIEW COMPARISON:  Chest x-ray dated February 02, 2021. FINDINGS: Stable cardiomegaly. Patchy consolidation in the right lower lobe has mildly improved. Unchanged small right pleural effusion. No pneumothorax. No acute osseous abnormality. IMPRESSION: 1. Mildly improved right lower lobe pneumonia. Electronically Signed   By: Titus Dubin M.D.   On:  02/05/2021 08:09        Scheduled Meds:  amiodarone  100 mg Oral BID   benzonatate  200 mg Oral TID   feeding supplement  237 mL Oral BID BM   ferrous sulfate  325 mg Oral BID   furosemide  20 mg Oral Daily   guaiFENesin  600 mg Oral BID   levothyroxine  50 mcg Oral Q0600   mirtazapine  7.5 mg Oral QHS   multivitamin with minerals  1 tablet Oral Daily   potassium chloride  40 mEq Oral Q2H   Rivaroxaban  15 mg Oral Q supper   Continuous Infusions:  sodium chloride Stopped (02/06/21 0040)   [START ON 02/07/2021] ceFEPime (MAXIPIME) IV       LOS: 4 days    Time spent: 35 minutes    Ayani Ospina B  Priscella Mann, MD [facility]   If 7PM-7AM, please contact night-coverage  02/06/2021, 1:43 PM

## 2021-02-06 NOTE — Progress Notes (Signed)
Initial Nutrition Assessment  DOCUMENTATION CODES:   Not applicable  INTERVENTION:   Ensure Enlive po BID, each supplement provides 350 kcal and 20 grams of protein  Magic cup TID with meals, each supplement provides 290 kcal and 9 grams of protein  MVI po daily   Liberalize diet   NUTRITION DIAGNOSIS:   Inadequate oral intake related to acute illness as evidenced by meal completion < 50%.  GOAL:   Patient will meet greater than or equal to 90% of their needs  MONITOR:   PO intake, Supplement acceptance, Labs, Weight trends, Skin, I & O's  REASON FOR ASSESSMENT:   Consult Assessment of nutrition requirement/status  ASSESSMENT:   87 year old female with past medical history significant for essential hypertension, hyperlipidemia, CVA, GERD, hypothyroidism, depression/anxiety, interstitial lung disease, anemia, SSS, SVT, history of pulmonary embolism, proximal atrial fibrillation on Xarelto, mitral valve prolapse, chronic systolic/diastolic congestive heart failure, bilateral carotid artery stenosis who presents to [facility] ED via EMS on 12/20 with progressive shortness of breath and was found to have HCAP  RD working remotely.  Unable to reach pt by phone. Per chart review, pt eating 25-50% of meals in hospital and is drinking Ensure supplements. Per chart, pt is down 5lbs(4%) over the past 2 weeks; RD unsure if weight loss is related to fluid changes or not. Recommend continue supplements and MVI. RD will add Magic Cups to meal trays and liberalize pt's diet as the heart healthy diet is restrictive of protein. Pt previously diagnosed with severe malnutrition but unable to diagnose at this time. RD will obtain nutrition related history and exam at follow-up.   Medications reviewed and include: ferrous sulfate, lasix, synthroid, mirtazapine, MVI, KCl, cefepime   Labs reviewed: K 3.2(L), BUN 31(H), creat 1.24(H) Hgb 10.7(L), Hct 34.5(L)  NUTRITION - FOCUSED PHYSICAL  EXAM: Unable to reach at this time   Diet Order:   Diet Order             Diet Heart Room service appropriate? Yes; Fluid consistency: Thin  Diet effective now                  EDUCATION NEEDS:   No education needs have been identified at this time  Skin:  Skin Assessment: Reviewed RN Assessment (MASD)  Last BM:  12/21  Height:   Ht Readings from Last 1 Encounters:  02/02/21 5\' 3"  (1.6 m)    Weight:   Wt Readings from Last 1 Encounters:  02/06/21 49.2 kg    Ideal Body Weight:  52.3 kg  BMI:  Body mass index is 19.21 kg/m.  Estimated Nutritional Needs:   Kcal:  1300-1500kcal/day  Protein:  65-75g/day  Fluid:  1.2-1.4L/day  Koleen Distance MS, RD, LDN Please refer to [facility] for RD and/or RD on-call/weekend/after hours pager

## 2021-02-06 NOTE — Progress Notes (Signed)
PHARMACY NOTE:  ANTIMICROBIAL RENAL DOSAGE ADJUSTMENT  Current antimicrobial regimen includes a mismatch between antimicrobial dosage and estimated renal function.  As per policy approved by the Pharmacy & Therapeutics and Medical Executive Committees, the antimicrobial dosage will be adjusted accordingly.  Current antimicrobial dosage:  cefepime 2 g IV q12h   Renal Function:  Estimated Creatinine Clearance: 24.4 mL/min (A) (by C-G formula based on SCr of 1.24 mg/dL (H)).     Antimicrobial dosage has been changed to:  cefepime 2 g IV q24h    Thank you for allowing pharmacy to be a part of this patient's care.  Tawnya Crook, PharmD, BCPS Clinical Pharmacist 02/06/2021 12:55 PM

## 2021-02-07 LAB — CULTURE, BLOOD (ROUTINE X 2)
Culture: NO GROWTH
Culture: NO GROWTH
Special Requests: ADEQUATE
Special Requests: ADEQUATE

## 2021-02-07 LAB — PROCALCITONIN: Procalcitonin: 1.43 ng/mL

## 2021-02-07 MED ORDER — DEXTROSE IN LACTATED RINGERS 5 % IV SOLN: INTRAVENOUS | Status: AC

## 2021-02-07 NOTE — Plan of Care (Signed)
°  Problem: Pain Managment: Goal: General experience of comfort will improve Outcome: Progressing   Problem: Activity: Goal: Ability to tolerate increased activity will improve Outcome: Progressing   Problem: Respiratory: Goal: Ability to maintain adequate ventilation will improve Outcome: Progressing

## 2021-02-07 NOTE — Progress Notes (Signed)
PROGRESS NOTE    Frances Kent  LTR:[id number] DOB: April 30, 1930 DOA: 02/02/2021 PCP: [name], MD    Brief Narrative:  87 year old female with past medical history significant for essential hypertension, hyperlipidemia, CVA, GERD, hypothyroidism, depression/anxiety, interstitial lung disease, anemia, SSS, SVT, history of pulmonary embolism, proximal atrial fibrillation on Xarelto, mitral valve prolapse, chronic systolic/diastolic congestive heart failure, bilateral carotid artery stenosis who presents to [facility] ED via EMS on 12/20 with progressive shortness of breath.  Patient also reports productive cough with associated fever/chills.  Denies chest pain, no nausea/vomiting/diarrhea, no abdominal pain, no symptoms of urinary tract infection.  On EMS arrival, patient was noted to be desaturating to the 80s with severe respiratory distress, accessory muscle use and placed on CPAP; and subsequently transported to the ED for further evaluation.   In the ED, temperature 100.3 F, HR 83, RR 26, BP 168/78, SPO2 100% on BiPAP with FiO2 60%.  VBG with pH 7.37, PCO2 31, PO2 32.0.  Sodium 133, potassium 4.3, chloride 105, CO2 18, glucose 125, BUN 20, creatinine 1.24.  Total bilirubin 2.8.  BNP elevated 798.7.  Lactic acid 3.0.  WBC 10.8, hemoglobin 10.1, platelets 259.  Procalcitonin 3.17.  COVID-19 PCR negative.  Influenza A/B PCR negative.  Blood cultures x2 ordered.  Chest x-ray with multifocal pneumonia right chest with right-sided effusion, cardiomegaly with vascular congestion.  Patient was started on empiric antibiotics by EDP.  Hospital service consulted for further evaluation management of acute respiratory failure secondary to pneumonia   Assessment & Plan:   Principal Problem:   HCAP (healthcare-associated pneumonia) Active Problems:   History of pulmonary embolism   Atrial fibrillation status post cardioversion [facility])   Hypothyroidism due to acquired atrophy of thyroid   Chronic systolic  CHF (congestive heart failure) (HCC)   Essential hypertension   Stage 3a chronic kidney disease (HCC)   Acute respiratory failure with hypoxia (HCC)   ILD (interstitial lung disease) (HCC)   CVA (cerebral vascular accident) ([place])   Iron deficiency anemia   Severe sepsis ([place])   Depression with anxiety  Sepsis, POA Lactic acidosis: Resolved Community acquired pneumonia, suspect atypical versus gram-negative organism Acute respiratory failure with hypoxia, POA Patient presenting to the ED with progressive shortness of breath, fever/chills and found to be hypoxic on EMS arrival with SPO2 in the 80s.  Patient was confused with associated metabolic encephalopathy and lactic acid elevated at 3.0 likely secondary to commune acquired pneumonia suspicious for atypical versus gram-negative organism.  MRSA PCR negative.  Initially requiring BiPAP to maintain oxygenation. Weaned off BiPAP.  On 2 L.  Encephalopathy improved. --lactic acid 3.0>1.8 --WBC 10.8>8.3>9.1>7.6 --Procalcitonin 3.17>5.90>3.82 --Blood cultures x2: no growth x 2 days --Strep pneumonia/Legionella urinary antigen: Pending Plan: Continue IV cefepime Limit to 5-day course Completed azithromycin, no need to restart Antitussives and mucolytic's as needed Incentive spirometry and flutter valve Nightly BiPAP Wean nasal cannula during the day Monitor vitals and fever curve Possible discharge in 24 to 48 hours if able to wean from oxygen  Weakness/deconditioning/debility: has been persistent.  Suspect related to poor p.o. intake and diminished nutritional status. Plan: RD follow-up Supplemental shakes Empiric D5 LR x8 hours   Acute metabolic encephalopathy, POA, improved Upon presentation, patient was noted to be confused.  Etiology likely secondary to sepsis from underlying pneumonia with hypoxia.  Patient was started on BiPAP with improvement of SPO2 and empiric antibiotics with resolution of encephalopathy. --Continue  supportive care --Treatment as above  Acute kidney injury Worsening creatinine over  the past 24 hours noted on 12/24 Plan: hold Lasix IV fluids Daily BMP  Essential hypertension Chronic systolic/diastolic congestive heart failure On furosemide 20 mg p.o. daily at home.  Follows with cardiology outpatient, Dr. [name]. On presentation, admitted with sepsis as above with chest x-ray with pulmonary vascular congestion and elevated BNP of 798.7.  TTE 11/01/2020 with LVEF 40-45%, LV mildly decreased function, no LV regional wall motion abnormalities, LA/RA moderately dilated, moderate/severe MR, moderate TR, no aortic stenosis, IVC normal in size. -No evidence of acute exacerbation Plan: Lasix on hold Short-term IV fluids Strict ins and outs, daily weights    Hyperlipidemia --Not on statin outpatient   Hypothyroidism --Levothyroxine 50 mcg p.o. daily   Depression/anxiety --Alprazolam 0.25 mg qHS PRN   Hx Interstitial lung disease --Albuterol neb as needed --Supplemental oxygen as above --Supportive care --Outpatient follow-up with pulmonology   Anemia --Ferrous sulfate 325 mg p.o. twice daily   Hx pulmonary embolism Paroxysmal atrial fibrillation  --Amiodarone 100 mg p.o. twice daily --Xarelto 15 mg p.o. daily    Bilateral carotid artery stenosis Outpatient follow-up with cardiology/vascular surgery      DVT prophylaxis: Xarelto Code Status: Partial Family Communication: Daughter [name] [phone] on 12/24 Disposition Plan: Status is: Inpatient  Remains inpatient appropriate because: Multifocal pneumonia.  Hypoxic respiratory failure.  AKI.  Slowly resolving but patient significantly weak and debilitated.  Anticipate discharge in 24 to 48 hours.       Level of care: Progressive  Consultants:  None  Procedures:  None  Antimicrobials: Cefepime   Subjective: Patient seen and examined.  Endorses lethargy and fatigue.  No pain  complaints  Objective: Vitals:   02/06/21 1929 02/06/21 2339 02/07/21 0521 02/07/21 0840  BP: (!) 110/56 127/63 (!) 124/59 131/66  Pulse: 74 66 75 68  Resp: 16 20 16 18   Temp: 97.9 F (36.6 C) 97.9 F (36.6 C) 98.8 F (37.1 C) 97.8 F (36.6 C)  TempSrc:    Oral  SpO2: 99% 100% 97% 99%  Weight:      Height:        Intake/Output Summary (Last 24 hours) at 02/07/2021 1125 Last data filed at 02/07/2021 0700 Gross per 24 hour  Intake 348.38 ml  Output 250 ml  Net 98.38 ml   Filed Weights   02/04/21 0426 02/05/21 0422 02/06/21 0500  Weight: 52.2 kg 50.3 kg 49.2 kg    Examination:  General exam: No acute distress.  Frail.  Fatigued Respiratory system: Bibasilar crackles.  Normal work of breathing.  2 L Cardiovascular system: S1-S2, RRR, no murmurs, no pedal edema Gastrointestinal system: Soft, NT/ND, normal bowel sounds Central nervous system: Alert and oriented. No focal neurological deficits. Extremities: Symmetric 5 x 5 power. Skin: No rashes, lesions or ulcers Psychiatry: Judgement and insight appear normal. Mood & affect appropriate.     Data Reviewed: I have personally reviewed following labs and imaging studies  CBC: Recent Labs  Lab 02/02/21 0927 02/03/21 0709 02/04/21 0643 02/05/21 0458 02/06/21 0615  WBC 10.8* 8.3 9.1 7.6 7.3  NEUTROABS 9.3*  --   --   --   --   HGB 10.1* 9.5* 9.9* 9.2* 10.7*  HCT 32.1* 31.0* 31.7* 30.0* 34.5*  MCV 85.1 84.0 83.4 84.3 84.6  PLT 259 227 264 230 559   Basic Metabolic Panel: Recent Labs  Lab 02/02/21 0927 02/03/21 0709 02/04/21 0643 02/05/21 0458 02/06/21 0615  NA 133* 136 137 136 138  K 4.3 5.1 4.6 3.9 3.2*  CL 105  109 109 106 103  CO2 18* 20* 20* 24 27  GLUCOSE 125* 146* 133* 96 102*  BUN 20 25* 29* 26* 31*  CREATININE 1.24* 0.93 0.96 1.01* 1.24*  CALCIUM 9.1 8.8* 9.0 8.4* 8.5*   GFR: Estimated Creatinine Clearance: 24.4 mL/min (A) (by C-G formula based on SCr of 1.24 mg/dL (H)). Liver Function  Tests: Recent Labs  Lab 02/02/21 0927  AST 36  ALT 31  ALKPHOS 102  BILITOT 2.8*  PROT 7.0  ALBUMIN 3.5   No results for input(s): LIPASE, AMYLASE in the last 168 hours. No results for input(s): AMMONIA in the last 168 hours. Coagulation Profile: No results for input(s): INR, PROTIME in the last 168 hours. Cardiac Enzymes: No results for input(s): CKTOTAL, CKMB, CKMBINDEX, TROPONINI in the last 168 hours. BNP (last 3 results) No results for input(s): PROBNP in the last 8760 hours. HbA1C: No results for input(s): HGBA1C in the last 72 hours. CBG: No results for input(s): GLUCAP in the last 168 hours. Lipid Profile: No results for input(s): CHOL, HDL, LDLCALC, TRIG, CHOLHDL, LDLDIRECT in the last 72 hours. Thyroid Function Tests: No results for input(s): TSH, T4TOTAL, FREET4, T3FREE, THYROIDAB in the last 72 hours. Anemia Panel: No results for input(s): VITAMINB12, FOLATE, FERRITIN, TIBC, IRON, RETICCTPCT in the last 72 hours. Sepsis Labs: Recent Labs  Lab 02/02/21 0926 02/02/21 0927 02/02/21 1139 02/04/21 0643 02/05/21 0458 02/06/21 0615 02/07/21 0549  PROCALCITON  --    < >  --  5.90 3.82 2.73 1.43  LATICACIDVEN 3.0*  --  1.8  --   --   --   --    < > = values in this interval not displayed.    Recent Results (from the past 240 hour(s))  Resp Panel by RT-PCR (Flu A&B, Covid) Nasopharyngeal Swab     Status: None   Collection Time: 02/02/21  9:27 AM   Specimen: Nasopharyngeal Swab; Nasopharyngeal(NP) swabs in vial transport medium  Result Value Ref Range Status   SARS Coronavirus 2 by RT PCR NEGATIVE NEGATIVE Final    Comment: (NOTE) SARS-CoV-2 target nucleic acids are NOT DETECTED.  The SARS-CoV-2 RNA is generally detectable in upper respiratory specimens during the acute phase of infection. The lowest concentration of SARS-CoV-2 viral copies this assay can detect is 138 copies/mL. A negative result does not preclude SARS-Cov-2 infection and should not be used  as the sole basis for treatment or other patient management decisions. A negative result may occur with  improper specimen collection/handling, submission of specimen other than nasopharyngeal swab, presence of viral mutation(s) within the areas targeted by this assay, and inadequate number of viral copies(<138 copies/mL). A negative result must be combined with clinical observations, patient history, and epidemiological information. The expected result is Negative.  Fact Sheet for Patients:  EntrepreneurPulse.com.au  Fact Sheet for Healthcare Providers:  IncredibleEmployment.be  This test is no t yet approved or cleared by the [place] FDA and  has been authorized for detection and/or diagnosis of SARS-CoV-2 by FDA under an Emergency Use Authorization (EUA). This EUA will remain  in effect (meaning this test can be used) for the duration of the COVID-19 declaration under Section 564(b)(1) of the Act, 21 U.S.C.section 360bbb-3(b)(1), unless the authorization is terminated  or revoked sooner.       Influenza A by PCR NEGATIVE NEGATIVE Final   Influenza B by PCR NEGATIVE NEGATIVE Final    Comment: (NOTE) The Xpert Xpress SARS-CoV-2/FLU/RSV plus assay is intended as an aid in  the diagnosis of influenza from Nasopharyngeal swab specimens and should not be used as a sole basis for treatment. Nasal washings and aspirates are unacceptable for Xpert Xpress SARS-CoV-2/FLU/RSV testing.  Fact Sheet for Patients: EntrepreneurPulse.com.au  Fact Sheet for Healthcare Providers: IncredibleEmployment.be  This test is not yet approved or cleared by the [place] FDA and has been authorized for detection and/or diagnosis of SARS-CoV-2 by FDA under an Emergency Use Authorization (EUA). This EUA will remain in effect (meaning this test can be used) for the duration of the COVID-19 declaration under Section 564(b)(1)  of the Act, 21 U.S.C. section 360bbb-3(b)(1), unless the authorization is terminated or revoked.  Performed at [facility], [address] [place], [place] [address]   Blood culture (routine x 2)     Status: None   Collection Time: 02/02/21 10:31 AM   Specimen: BLOOD  Result Value Ref Range Status   Specimen Description BLOOD RIGHT FA  Final   Special Requests   Final    BOTTLES DRAWN AEROBIC AND ANAEROBIC Blood Culture adequate volume   Culture   Final    NO GROWTH 5 DAYS Performed at [facility], [address], [place], [place] [address]    Report Status 02/07/2021 FINAL  Final  Blood culture (routine x 2)     Status: None   Collection Time: 02/02/21 10:31 AM   Specimen: BLOOD  Result Value Ref Range Status   Specimen Description BLOOD RIGHT HAND  Final   Special Requests   Final    BOTTLES DRAWN AEROBIC AND ANAEROBIC Blood Culture adequate volume   Culture   Final    NO GROWTH 5 DAYS Performed at [facility], [address] [place], [place] [address]    Report Status 02/07/2021 FINAL  Final  MRSA Next Gen by PCR, Nasal     Status: None   Collection Time: 02/03/21  9:32 AM   Specimen: Nasal Mucosa; Nasal Swab  Result Value Ref Range Status   MRSA by PCR Next Gen NOT DETECTED NOT DETECTED Final    Comment: (NOTE) The GeneXpert MRSA Assay (FDA approved for NASAL specimens only), is one component of a comprehensive MRSA colonization surveillance program. It is not intended to diagnose MRSA infection nor to guide or monitor treatment for MRSA infections. Test performance is not FDA approved in patients less than 58 years old. Performed at [facility], [address] [place], [place] [address]          Radiology Studies: No results found.      Scheduled Meds:  amiodarone  100 mg Oral BID   benzonatate  200 mg Oral TID   feeding supplement  237 mL Oral BID BM   ferrous sulfate  325 mg Oral BID   guaiFENesin   600 mg Oral BID   levothyroxine  50 mcg Oral Q0600   mirtazapine  7.5 mg Oral QHS   multivitamin with minerals  1 tablet Oral Daily   Rivaroxaban  15 mg Oral Q supper   Continuous Infusions:  sodium chloride Stopped (02/06/21 1119)   ceFEPime (MAXIPIME) IV 2 g (02/07/21 0902)   dextrose 5% lactated ringers 75 mL/hr at 02/07/21 1035     LOS: 5 days    Time spent: 25 minutes    Sidney Ace, MD [facility]   If 7PM-7AM, please contact night-coverage  02/07/2021, 11:25 AM

## 2021-02-08 ENCOUNTER — Inpatient Hospital Stay: Payer: Medicare HMO

## 2021-02-08 LAB — CBC WITH DIFFERENTIAL/PLATELET
Abs Immature Granulocytes: 0.62 10*3/uL — ABNORMAL HIGH (ref 0.00–0.07)
Basophils Absolute: 0.1 10*3/uL (ref 0.0–0.1)
Basophils Relative: 2 %
Eosinophils Absolute: 0.4 10*3/uL (ref 0.0–0.5)
Eosinophils Relative: 5 %
HCT: 34.3 % — ABNORMAL LOW (ref 36.0–46.0)
Hemoglobin: 10.5 g/dL — ABNORMAL LOW (ref 12.0–15.0)
Immature Granulocytes: 7 %
Lymphocytes Relative: 19 %
Lymphs Abs: 1.6 10*3/uL (ref 0.7–4.0)
MCH: 25.8 pg — ABNORMAL LOW (ref 26.0–34.0)
MCHC: 30.6 g/dL (ref 30.0–36.0)
MCV: 84.3 fL (ref 80.0–100.0)
Monocytes Absolute: 0.6 10*3/uL (ref 0.1–1.0)
Monocytes Relative: 7 %
Neutro Abs: 5.1 10*3/uL (ref 1.7–7.7)
Neutrophils Relative %: 60 %
Platelets: 319 10*3/uL (ref 150–400)
RBC: 4.07 MIL/uL (ref 3.87–5.11)
RDW: 22.4 % — ABNORMAL HIGH (ref 11.5–15.5)
Smear Review: NORMAL
WBC: 8.5 10*3/uL (ref 4.0–10.5)
nRBC: 0 % (ref 0.0–0.2)

## 2021-02-08 LAB — BASIC METABOLIC PANEL
Anion gap: 3 — ABNORMAL LOW (ref 5–15)
BUN: 27 mg/dL — ABNORMAL HIGH (ref 8–23)
CO2: 25 mmol/L (ref 22–32)
Calcium: 8.7 mg/dL — ABNORMAL LOW (ref 8.9–10.3)
Chloride: 108 mmol/L (ref 98–111)
Creatinine, Ser: 1.04 mg/dL — ABNORMAL HIGH (ref 0.44–1.00)
GFR, Estimated: 52 mL/min — ABNORMAL LOW (ref >60)
Glucose, Bld: 90 mg/dL (ref 70–99)
Potassium: 4.6 mmol/L (ref 3.5–5.1)
Sodium: 136 mmol/L (ref 135–145)

## 2021-02-08 LAB — MAGNESIUM: Magnesium: 2.2 mg/dL (ref 1.7–2.4)

## 2021-02-08 NOTE — Care Management Important Message (Signed)
Important Message  Patient Details  Name: Frances Kent MRN: [id number] Date of Birth: Aug 04, 1930   Medicare Important Message Given:  Yes     Dannette Barbara 02/08/2021, 2:28 PM

## 2021-02-08 NOTE — Progress Notes (Signed)
PROGRESS NOTE    ISMAHAN LIPPMAN  WUJ:[id number] DOB: 06-23-1930 DOA: 02/02/2021 PCP: [name], MD    Brief Narrative:  87 year old female with past medical history significant for essential hypertension, hyperlipidemia, CVA, GERD, hypothyroidism, depression/anxiety, interstitial lung disease, anemia, SSS, SVT, history of pulmonary embolism, proximal atrial fibrillation on Xarelto, mitral valve prolapse, chronic systolic/diastolic congestive heart failure, bilateral carotid artery stenosis who presents to [facility] ED via EMS on 12/20 with progressive shortness of breath.  Patient also reports productive cough with associated fever/chills.  Denies chest pain, no nausea/vomiting/diarrhea, no abdominal pain, no symptoms of urinary tract infection.  On EMS arrival, patient was noted to be desaturating to the 80s with severe respiratory distress, accessory muscle use and placed on CPAP; and subsequently transported to the ED for further evaluation.   In the ED, temperature 100.3 F, HR 83, RR 26, BP 168/78, SPO2 100% on BiPAP with FiO2 60%.  VBG with pH 7.37, PCO2 31, PO2 32.0.  Sodium 133, potassium 4.3, chloride 105, CO2 18, glucose 125, BUN 20, creatinine 1.24.  Total bilirubin 2.8.  BNP elevated 798.7.  Lactic acid 3.0.  WBC 10.8, hemoglobin 10.1, platelets 259.  Procalcitonin 3.17.  COVID-19 PCR negative.  Influenza A/B PCR negative.  Blood cultures x2 ordered.  Chest x-ray with multifocal pneumonia right chest with right-sided effusion, cardiomegaly with vascular congestion.  Patient was started on empiric antibiotics by EDP.  Hospital service consulted for further evaluation management of acute respiratory failure secondary to pneumonia.  Sepsis has resolved.  Patient remains significantly weak and deconditioned.  Requesting repeat evaluations from therapy prior to discharge planning.   Assessment & Plan:   Principal Problem:   HCAP (healthcare-associated pneumonia) Active Problems:    History of pulmonary embolism   Atrial fibrillation status post cardioversion [facility])   Hypothyroidism due to acquired atrophy of thyroid   Chronic systolic CHF (congestive heart failure) (HCC)   Essential hypertension   Stage 3a chronic kidney disease (HCC)   Acute respiratory failure with hypoxia (HCC)   ILD (interstitial lung disease) (HCC)   CVA (cerebral vascular accident) ([place])   Iron deficiency anemia   Severe sepsis ([place])   Depression with anxiety  Sepsis, POA Lactic acidosis: Resolved Community acquired pneumonia, suspect atypical versus gram-negative organism Acute respiratory failure with hypoxia, POA Patient presenting to the ED with progressive shortness of breath, fever/chills and found to be hypoxic on EMS arrival with SPO2 in the 80s.  Patient was confused with associated metabolic encephalopathy and lactic acid elevated at 3.0 likely secondary to commune acquired pneumonia suspicious for atypical versus gram-negative organism.  MRSA PCR negative.  Initially requiring BiPAP to maintain oxygenation. Weaned off BiPAP.  On 2 L.  Encephalopathy improved. --lactic acid 3.0>1.8 --WBC 10.8>8.3>9.1>7.6 --Procalcitonin 3.17>5.90>3.82 --Blood cultures x2: no growth x 2 days --Strep pneumonia/Legionella urinary antigen: Pending Plan: Last dose of IV cefepime today.  Completed 6-day course Completed azithromycin, no need to restart Antitussives and mucolytic's as needed Incentive spirometry and flutter valve Nightly BiPAP Wean nasal cannula during the day Monitor vitals and fever curve Possible discharge in 24 to 48 hours if able to wean from oxygen Remains on 2 L no further IV fluids  Weakness/deconditioning/debility: has been persistent.  Suspect related to poor p.o. intake and diminished nutritional status. Plan: RD follow-up Supplemental shakes Empiric D5 LR x8 hours Requested repeat evaluation for PT and OT   Acute metabolic encephalopathy, POA, improved Upon  presentation, patient was noted to be confused.  Etiology  likely secondary to sepsis from underlying pneumonia with hypoxia.  Patient was started on BiPAP with improvement of SPO2 and empiric antibiotics with resolution of encephalopathy. --Continue supportive care --Treatment as above  Acute kidney injury Now improving Plan: hold Lasix No IVF Daily BMP  Essential hypertension Chronic systolic/diastolic congestive heart failure On furosemide 20 mg p.o. daily at home.  Follows with cardiology outpatient, Dr. [name]. On presentation, admitted with sepsis as above with chest x-ray with pulmonary vascular congestion and elevated BNP of 798.7.  TTE 11/01/2020 with LVEF 40-45%, LV mildly decreased function, no LV regional wall motion abnormalities, LA/RA moderately dilated, moderate/severe MR, moderate TR, no aortic stenosis, IVC normal in size. -No evidence of acute exacerbation Plan: Lasix on hold No IVF Strict ins and outs, daily weights    Hyperlipidemia --Not on statin outpatient   Hypothyroidism --Levothyroxine 50 mcg p.o. daily   Depression/anxiety --Alprazolam 0.25 mg qHS PRN   Hx Interstitial lung disease --Albuterol neb as needed --Supplemental oxygen as above --Supportive care --Outpatient follow-up with pulmonology   Anemia --Ferrous sulfate 325 mg p.o. twice daily   Hx pulmonary embolism Paroxysmal atrial fibrillation  --Amiodarone 100 mg p.o. twice daily --Xarelto 15 mg p.o. daily    Bilateral carotid artery stenosis Outpatient follow-up with cardiology/vascular surgery      DVT prophylaxis: Xarelto Code Status: Partial Family Communication: Daughter [name] [phone] on 12/24, 12/20 6 repeat therapy evaluations.  Anticipate discharge in 24 hours Disposition Plan: Status is: Inpatient  Remains inpatient appropriate because: Multifocal pneumonia.  Hypoxic respiratory failure.  AKI.  Slowly resolving but patient significantly weak and debilitated.   Anticipate discharge in 24 to 48 hours.       Level of care: Progressive  Consultants:  None  Procedures:  None  Antimicrobials: Cefepime   Subjective: Patient seen and examined.  Endorses lethargy and fatigue.  No pain complaints  Objective: Vitals:   02/08/21 0033 02/08/21 0409 02/08/21 0758 02/08/21 1138  BP: (!) 121/54 (!) 147/54 (!) 144/61 (!) 117/46  Pulse: 73 (!) 57 66 62  Resp: 16 16 17 17   Temp: 98 F (36.7 C) 97.7 F (36.5 C) 97.7 F (36.5 C) 97.6 F (36.4 C)  TempSrc:   Oral Oral  SpO2: 98% 100% 100% 100%  Weight:      Height:        Intake/Output Summary (Last 24 hours) at 02/08/2021 1305 Last data filed at 02/08/2021 0500 Gross per 24 hour  Intake 340 ml  Output 400 ml  Net -60 ml   Filed Weights   02/04/21 0426 02/05/21 0422 02/06/21 0500  Weight: 52.2 kg 50.3 kg 49.2 kg    Examination:  General exam: No acute distress.  Fatigued Respiratory system: Bibasilar crackles.  Normal work breathing.  2 L Cardiovascular system: S1-S2, RRR, no murmurs, no pedal edema Gastrointestinal system: Soft, NT/ND, normal bowel sounds Central nervous system: Alert and oriented. No focal neurological deficits. Extremities: Symmetric 5 x 5 power. Skin: No rashes, lesions or ulcers Psychiatry: Judgement and insight appear normal. Mood & affect appropriate.     Data Reviewed: I have personally reviewed following labs and imaging studies  CBC: Recent Labs  Lab 02/02/21 0927 02/03/21 0709 02/04/21 0643 02/05/21 0458 02/06/21 0615 02/08/21 0802  WBC 10.8* 8.3 9.1 7.6 7.3 8.5  NEUTROABS 9.3*  --   --   --   --  5.1  HGB 10.1* 9.5* 9.9* 9.2* 10.7* 10.5*  HCT 32.1* 31.0* 31.7* 30.0* 34.5* 34.3*  MCV 85.1  84.0 83.4 84.3 84.6 84.3  PLT 259 227 264 230 270 803   Basic Metabolic Panel: Recent Labs  Lab 02/03/21 0709 02/04/21 0643 02/05/21 0458 02/06/21 0615 02/08/21 0802  NA 136 137 136 138 136  K 5.1 4.6 3.9 3.2* 4.6  CL 109 109 106 103 108   CO2 20* 20* 24 27 25   GLUCOSE 146* 133* 96 102* 90  BUN 25* 29* 26* 31* 27*  CREATININE 0.93 0.96 1.01* 1.24* 1.04*  CALCIUM 8.8* 9.0 8.4* 8.5* 8.7*  MG  --   --   --   --  2.2   GFR: Estimated Creatinine Clearance: 29 mL/min (A) (by C-G formula based on SCr of 1.04 mg/dL (H)). Liver Function Tests: Recent Labs  Lab 02/02/21 0927  AST 36  ALT 31  ALKPHOS 102  BILITOT 2.8*  PROT 7.0  ALBUMIN 3.5   No results for input(s): LIPASE, AMYLASE in the last 168 hours. No results for input(s): AMMONIA in the last 168 hours. Coagulation Profile: No results for input(s): INR, PROTIME in the last 168 hours. Cardiac Enzymes: No results for input(s): CKTOTAL, CKMB, CKMBINDEX, TROPONINI in the last 168 hours. BNP (last 3 results) No results for input(s): PROBNP in the last 8760 hours. HbA1C: No results for input(s): HGBA1C in the last 72 hours. CBG: No results for input(s): GLUCAP in the last 168 hours. Lipid Profile: No results for input(s): CHOL, HDL, LDLCALC, TRIG, CHOLHDL, LDLDIRECT in the last 72 hours. Thyroid Function Tests: No results for input(s): TSH, T4TOTAL, FREET4, T3FREE, THYROIDAB in the last 72 hours. Anemia Panel: No results for input(s): VITAMINB12, FOLATE, FERRITIN, TIBC, IRON, RETICCTPCT in the last 72 hours. Sepsis Labs: Recent Labs  Lab 02/02/21 0926 02/02/21 0927 02/02/21 1139 02/04/21 0643 02/05/21 0458 02/06/21 0615 02/07/21 0549  PROCALCITON  --    < >  --  5.90 3.82 2.73 1.43  LATICACIDVEN 3.0*  --  1.8  --   --   --   --    < > = values in this interval not displayed.    Recent Results (from the past 240 hour(s))  Resp Panel by RT-PCR (Flu A&B, Covid) Nasopharyngeal Swab     Status: None   Collection Time: 02/02/21  9:27 AM   Specimen: Nasopharyngeal Swab; Nasopharyngeal(NP) swabs in vial transport medium  Result Value Ref Range Status   SARS Coronavirus 2 by RT PCR NEGATIVE NEGATIVE Final    Comment: (NOTE) SARS-CoV-2 target nucleic acids are  NOT DETECTED.  The SARS-CoV-2 RNA is generally detectable in upper respiratory specimens during the acute phase of infection. The lowest concentration of SARS-CoV-2 viral copies this assay can detect is 138 copies/mL. A negative result does not preclude SARS-Cov-2 infection and should not be used as the sole basis for treatment or other patient management decisions. A negative result may occur with  improper specimen collection/handling, submission of specimen other than nasopharyngeal swab, presence of viral mutation(s) within the areas targeted by this assay, and inadequate number of viral copies(<138 copies/mL). A negative result must be combined with clinical observations, patient history, and epidemiological information. The expected result is Negative.  Fact Sheet for Patients:  EntrepreneurPulse.com.au  Fact Sheet for Healthcare Providers:  IncredibleEmployment.be  This test is no t yet approved or cleared by the [place] FDA and  has been authorized for detection and/or diagnosis of SARS-CoV-2 by FDA under an Emergency Use Authorization (EUA). This EUA will remain  in effect (meaning this test can be used)  for the duration of the COVID-19 declaration under Section 564(b)(1) of the Act, 21 U.S.C.section 360bbb-3(b)(1), unless the authorization is terminated  or revoked sooner.       Influenza A by PCR NEGATIVE NEGATIVE Final   Influenza B by PCR NEGATIVE NEGATIVE Final    Comment: (NOTE) The Xpert Xpress SARS-CoV-2/FLU/RSV plus assay is intended as an aid in the diagnosis of influenza from Nasopharyngeal swab specimens and should not be used as a sole basis for treatment. Nasal washings and aspirates are unacceptable for Xpert Xpress SARS-CoV-2/FLU/RSV testing.  Fact Sheet for Patients: EntrepreneurPulse.com.au  Fact Sheet for Healthcare Providers: IncredibleEmployment.be  This test is not  yet approved or cleared by the [place] FDA and has been authorized for detection and/or diagnosis of SARS-CoV-2 by FDA under an Emergency Use Authorization (EUA). This EUA will remain in effect (meaning this test can be used) for the duration of the COVID-19 declaration under Section 564(b)(1) of the Act, 21 U.S.C. section 360bbb-3(b)(1), unless the authorization is terminated or revoked.  Performed at [facility], [address] [place], [place] [address]   Blood culture (routine x 2)     Status: None   Collection Time: 02/02/21 10:31 AM   Specimen: BLOOD  Result Value Ref Range Status   Specimen Description BLOOD RIGHT FA  Final   Special Requests   Final    BOTTLES DRAWN AEROBIC AND ANAEROBIC Blood Culture adequate volume   Culture   Final    NO GROWTH 5 DAYS Performed at [facility], [address], [place], [place] [address]    Report Status 02/07/2021 FINAL  Final  Blood culture (routine x 2)     Status: None   Collection Time: 02/02/21 10:31 AM   Specimen: BLOOD  Result Value Ref Range Status   Specimen Description BLOOD RIGHT HAND  Final   Special Requests   Final    BOTTLES DRAWN AEROBIC AND ANAEROBIC Blood Culture adequate volume   Culture   Final    NO GROWTH 5 DAYS Performed at [facility], [address], [place], [place] [address]    Report Status 02/07/2021 FINAL  Final  MRSA Next Gen by PCR, Nasal     Status: None   Collection Time: 02/03/21  9:32 AM   Specimen: Nasal Mucosa; Nasal Swab  Result Value Ref Range Status   MRSA by PCR Next Gen NOT DETECTED NOT DETECTED Final    Comment: (NOTE) The GeneXpert MRSA Assay (FDA approved for NASAL specimens only), is one component of a comprehensive MRSA colonization surveillance program. It is not intended to diagnose MRSA infection nor to guide or monitor treatment for MRSA infections. Test performance is not FDA approved in patients less than 8 years old. Performed at  [facility], [address] [place], [place] [address]          Radiology Studies: [facility] Chest [place] 1 View  Result Date: 02/08/2021 CLINICAL DATA:  Shortness of breath. EXAM: PORTABLE CHEST 1 VIEW COMPARISON:  02/05/2021 FINDINGS: 0906 hours. The cardio pericardial silhouette is enlarged. Vascular congestion with diffuse interstitial opacity is similar to prior. Interval improvement in right base airspace disease. The visualized bony structures of the thorax show no acute abnormality. Telemetry leads overlie the chest. IMPRESSION: Interval improvement in right base airspace disease. Electronically Signed   By: Misty Stanley M.D.   On: 02/08/2021 09:59        Scheduled Meds:  amiodarone  100 mg Oral BID  benzonatate  200 mg Oral TID   feeding supplement  237 mL Oral BID BM   ferrous sulfate  325 mg Oral BID   guaiFENesin  600 mg Oral BID   levothyroxine  50 mcg Oral Q0600   mirtazapine  7.5 mg Oral QHS   multivitamin with minerals  1 tablet Oral Daily   Rivaroxaban  15 mg Oral Q supper   Continuous Infusions:  sodium chloride Stopped (02/06/21 1119)     LOS: 6 days    Time spent: 25 minutes    Sidney Ace, MD [facility]   If 7PM-7AM, please contact night-coverage  02/08/2021, 1:05 PM

## 2021-02-08 NOTE — Progress Notes (Signed)
Physical Therapy Treatment Patient Details Name: Frances Kent MRN: [id number] DOB: [birth year] Today's Date: 02/08/2021   History of Present Illness Pt. is a 87 y.o. female with medical history significant of hypertension, hyperlipidemia, stroke, GERD, hypothyroidism, depression with anxiety, interstitial lung disease, anemia, SSS, SVT, PE and atrial fibrillation on Xarelto, mitral valve prolapse, CHF with EF of 40-45%, bilateral carotid artery stenosis, who presents with shortness of breath.    PT Comments    Patient was received in bed this pm stating she had just got back to bed with assist from nursing. She reported increased fatigue and declined to stand or walk but was agreeable to some Bed exercises. She had difficulty keeping eyes open during session- but alert and responsive- able to carry on basic conversation. She was able to perform some supine LE strengthening and encouraged to perform on her own to continue to build her strength. Patient would benefit from further PT interventions to progress toward all mobility goals. Recommendation of HHPT remains appropriate at this time.     Recommendations for follow up therapy are one component of a multi-disciplinary discharge planning process, led by the attending physician.  Recommendations may be updated based on patient status, additional functional criteria and insurance authorization.  Follow Up Recommendations  Home health PT     Assistance Recommended at Discharge Frequent or constant Supervision/Assistance  Equipment Recommendations  None recommended by PT    Recommendations for Other Services       Precautions / Restrictions Precautions Precautions: Fall Restrictions Weight Bearing Restrictions: No     Mobility  Bed Mobility                 Patient Response: Cooperative  Transfers     Transfers:  (Patient decilned to transfer secondary to undo fatigue.)                   Ambulation/Gait Ambulation/Gait assistance:  (Patient declinced to transfer or walk due to undo fatigue.)                 Stairs             Wheelchair Mobility    Modified Rankin (Stroke Patients Only)       Balance                                            Cognition Arousal/Alertness: Awake/alert;Lethargic Behavior During Therapy: WFL for tasks assessed/performed Overall Cognitive Status: Within Functional Limits for tasks assessed                                 General Comments: Patient reported increased tired feeling- alert and appropriate with all communication        Exercises General Exercises - Lower Extremity Ankle Circles/Pumps: AROM;Both;15 reps Quad Sets: AROM;Both;10 reps Heel Slides: AROM;Both;10 reps Hip ABduction/ADduction: AROM;Both;10 reps Straight Leg Raises: AAROM;Both;10 reps    General Comments        Pertinent Vitals/Pain Pain Score: 5  Pain Location: central low back Pain Descriptors / Indicators: Aching;Sore Pain Intervention(s): Limited activity within patient's tolerance;Monitored during session;Repositioned    Home Living Family/patient expects to be discharged to:: Private residence Living Arrangements: Other relatives Available Help at Discharge: Family;Available 24 hours/day Type of Home: House Home Access: Level entry  Alternate Level Stairs-Number of Steps: N/A Home Layout: Laundry or work area in basement;Able to live on main level with bedroom/bathroom;Two level Home Equipment: Conservation officer, nature (2 wheels);Rollator (4 wheels);BSC/3in1;Wheelchair - manual;Grab bars - tub/shower;Shower seat - built in;Shower seat Additional Comments: Reports not using rollator in community dwellings only.    Prior Function            PT Goals (current goals can now be found in the care plan section) Acute Rehab PT Goals Patient Stated Goal: to return home PT Goal Formulation: With  patient Time For Goal Achievement: 02/17/21 Potential to Achieve Goals: Good    Frequency    Min 2X/week      PT Plan      Co-evaluation              AM-PAC PT "6 Clicks" Mobility   Outcome Measure  Help needed turning from your back to your side while in a flat bed without using bedrails?: A Little Help needed moving from lying on your back to sitting on the side of a flat bed without using bedrails?: A Little Help needed moving to and from a bed to a chair (including a wheelchair)?: A Little Help needed standing up from a chair using your arms (e.g., wheelchair or bedside chair)?: A Little Help needed to walk in hospital room?: A Little Help needed climbing 3-5 steps with a railing? : A Lot 6 Click Score: 17    End of Session Equipment Utilized During Treatment: Gait belt;Oxygen Activity Tolerance: Patient limited by lethargy Patient left: in bed;with call bell/phone within reach;with bed alarm set;with family/visitor present Nurse Communication: Mobility status PT Visit Diagnosis: Difficulty in walking, not elsewhere classified (R26.2);Muscle weakness (generalized) (M62.81)     Time: 4801-6553 PT Time Calculation (min) (ACUTE ONLY): 28 min  Charges:  $Therapeutic Exercise: 23-37 mins                     Ollen Bowl, PT    02/08/2021, 2:35 PM

## 2021-02-09 LAB — BASIC METABOLIC PANEL
Anion gap: 5 (ref 5–15)
BUN: 22 mg/dL (ref 8–23)
CO2: 23 mmol/L (ref 22–32)
Calcium: 8.3 mg/dL — ABNORMAL LOW (ref 8.9–10.3)
Chloride: 108 mmol/L (ref 98–111)
Creatinine, Ser: 0.92 mg/dL (ref 0.44–1.00)
GFR, Estimated: 60 mL/min — ABNORMAL LOW (ref >60)
Glucose, Bld: 85 mg/dL (ref 70–99)
Potassium: 4.4 mmol/L (ref 3.5–5.1)
Sodium: 136 mmol/L (ref 135–145)

## 2021-02-09 LAB — CBC
HCT: 32.5 % — ABNORMAL LOW (ref 36.0–46.0)
Hemoglobin: 10 g/dL — ABNORMAL LOW (ref 12.0–15.0)
MCH: 25.7 pg — ABNORMAL LOW (ref 26.0–34.0)
MCHC: 30.8 g/dL (ref 30.0–36.0)
MCV: 83.5 fL (ref 80.0–100.0)
Platelets: 367 10*3/uL (ref 150–400)
RBC: 3.89 MIL/uL (ref 3.87–5.11)
RDW: 22.4 % — ABNORMAL HIGH (ref 11.5–15.5)
WBC: 8.6 10*3/uL (ref 4.0–10.5)
nRBC: 0 % (ref 0.0–0.2)

## 2021-02-09 MED ORDER — DOCUSATE SODIUM 100 MG PO CAPS: 100.0000 mg | ORAL_CAPSULE | Freq: Two times a day (BID) | ORAL | Status: DC

## 2021-02-09 MED ORDER — DOCUSATE SODIUM 100 MG PO CAPS
100.0000 mg | ORAL_CAPSULE | Freq: Two times a day (BID) | ORAL | Status: DC
  Administered 2021-02-09 – 2021-02-10 (×2): 100 mg via ORAL
  Filled 2021-02-09 (×2): qty 1

## 2021-02-09 NOTE — Progress Notes (Signed)
Physical Therapy Treatment Patient Details Name: Frances Kent MRN: [id number] DOB: Jul 07, 1930 Today's Date: 02/09/2021   History of Present Illness Pt. is a 87 y.o. female with medical history significant of hypertension, hyperlipidemia, stroke, GERD, hypothyroidism, depression with anxiety, interstitial lung disease, anemia, SSS, SVT, PE and atrial fibrillation on Xarelto, mitral valve prolapse, CHF with EF of 40-45%, bilateral carotid artery stenosis, who presents with shortness of breath.    PT Comments    Pt resting in bed upon PT arrival; pt's daughter present; pt agreeable to therapy session.  SBA with bed mobility; CGA to SBA with transfers; and CGA to SBA with ambulation 30 feet x2 with RW (pt requiring sitting rest break between ambulation trials d/t fatigue).  Pt's O2 sats 92% or greater on room air during sessions activities.  Will continue to focus on strengthening, endurance, balance, and progressive functional mobility per pt tolerance.  Pt's daughter reports being able to provide 24/7 assist and has w/c at home to use as needed.    Recommendations for follow up therapy are one component of a multi-disciplinary discharge planning process, led by the attending physician.  Recommendations may be updated based on patient status, additional functional criteria and insurance authorization.  Follow Up Recommendations  Home health PT     Assistance Recommended at Discharge Frequent or constant Supervision/Assistance  Equipment Recommendations  None recommended by PT (pt has needed DME at home already)    Recommendations for Other Services       Precautions / Restrictions Precautions Precautions: Fall Restrictions Weight Bearing Restrictions: No     Mobility  Bed Mobility Overal bed mobility: Needs Assistance Bed Mobility: Supine to Sit;Sit to Supine     Supine to sit: Supervision;HOB elevated Sit to supine: Supervision;HOB elevated   General bed mobility comments:  increased effort to perform on own    Transfers Overall transfer level: Needs assistance Equipment used: Rolling walker (2 wheels) Transfers: Sit to/from Stand Sit to Stand: Supervision;Min guard           General transfer comment: mild increased effort to stand from bed up to RW (x2 trials); controlled descent noted sitting back onto bed    Ambulation/Gait Ambulation/Gait assistance: Min guard;Supervision Gait Distance (Feet):  (30 feet x2) Assistive device: Rolling walker (2 wheels)   Gait velocity: decreased     General Gait Details: partial step through gait pattern; steady; mild trunk flexion requiring occasional vc's for upright posture   Stairs             Wheelchair Mobility    Modified Rankin (Stroke Patients Only)       Balance Overall balance assessment: Needs assistance Sitting-balance support: No upper extremity supported;Feet supported Sitting balance-Leahy Scale: Good Sitting balance - Comments: steady sitting reaching within BOS   Standing balance support: Bilateral upper extremity supported;During functional activity Standing balance-Leahy Scale: Good Standing balance comment: steady ambulating with RW                            Cognition Arousal/Alertness: Awake/alert Behavior During Therapy: WFL for tasks assessed/performed Overall Cognitive Status: Within Functional Limits for tasks assessed                                          Exercises    General Comments  Nursing cleared pt for participation in physical  therapy.  Pt agreeable to PT session.  Pt's daughter present during session.       Pertinent Vitals/Pain Pain Assessment: Faces Faces Pain Scale: Hurts little more Pain Location: low back Pain Descriptors / Indicators: Aching;Sore Pain Intervention(s): Limited activity within patient's tolerance;Monitored during session;Repositioned Vitals (HR and O2 on room air) stable and WFL throughout  treatment session.    Home Living                          Prior Function            PT Goals (current goals can now be found in the care plan section) Acute Rehab PT Goals Patient Stated Goal: to return home PT Goal Formulation: With patient Time For Goal Achievement: 02/17/21 Potential to Achieve Goals: Good Progress towards PT goals: Progressing toward goals    Frequency    Min 2X/week      PT Plan Current plan remains appropriate    Co-evaluation              AM-PAC PT "6 Clicks" Mobility   Outcome Measure  Help needed turning from your back to your side while in a flat bed without using bedrails?: A Little Help needed moving from lying on your back to sitting on the side of a flat bed without using bedrails?: A Little Help needed moving to and from a bed to a chair (including a wheelchair)?: A Little Help needed standing up from a chair using your arms (e.g., wheelchair or bedside chair)?: A Little Help needed to walk in hospital room?: A Little Help needed climbing 3-5 steps with a railing? : A Lot 6 Click Score: 17    End of Session Equipment Utilized During Treatment: Gait belt Activity Tolerance: Patient limited by fatigue Patient left: in bed;with call bell/phone within reach;with bed alarm set;with family/visitor present Nurse Communication: Mobility status;Precautions PT Visit Diagnosis: Difficulty in walking, not elsewhere classified (R26.2);Muscle weakness (generalized) (M62.81)     Time: 5361-4431 PT Time Calculation (min) (ACUTE ONLY): 26 min  Charges:  $Therapeutic Exercise: 8-22 mins $Therapeutic Activity: 8-22 mins                    Leitha Bleak, PT 02/09/21, 5:03 PM

## 2021-02-09 NOTE — Progress Notes (Signed)
Occupational Therapy Treatment Patient Details Name: Frances Kent MRN: [id number] DOB: [birth year] Today's Date: 02/09/2021   History of present illness Pt. is a 87 y.o. female with medical history significant of hypertension, hyperlipidemia, stroke, GERD, hypothyroidism, depression with anxiety, interstitial lung disease, anemia, SSS, SVT, PE and atrial fibrillation on Xarelto, mitral valve prolapse, CHF with EF of 40-45%, bilateral carotid artery stenosis, who presents with shortness of breath.   OT comments  Pt seen for OT tx this date. Pt initially sleeping but wakes easily and agreeable with encouragement. Dtr present and supportive. Pt required CGA for sup>sit EOB, significant time and effort required with use of bed rails, but no direct assist. Initial slight LOB requiring CGA-MIN A to correct which improved to Supv-SBA for static sitting balance. Pt sat EOB to apply lotion to her face and hands with supervision for sitting balance, stood with UE support on the RW to comb her hair and wash her face, moderate VC and tactile cue to improve upright posture. Pt/dtr instructed in positioning in the bed for improved comfort as well as sitting more upright during the day to help with lung function; instructed in incentive spirometer requiring additional instruction and return demo to get correct technique, very shallow breaths. RN notified of pt's progress. SpO2 remained in mid-90's on room air with exertion. Pt left on room air, RN notified. Pt progressing slowly, continues to benefit from skilled OT services to maximize return to PLOF and minimize caregiver burden. Continue to recommend [place] services at this time. Dtr reports family plans to hire increased support initially to assist pt.     Recommendations for follow up therapy are one component of a multi-disciplinary discharge planning process, led by the attending physician.  Recommendations may be updated based on patient status, additional  functional criteria and insurance authorization.    Follow Up Recommendations  Home health OT    Assistance Recommended at Discharge Frequent or constant Supervision/Assistance  Equipment Recommendations  None recommended by OT    Recommendations for Other Services      Precautions / Restrictions Precautions Precautions: Fall Restrictions Weight Bearing Restrictions: No       Mobility Bed Mobility Overal bed mobility: Needs Assistance Bed Mobility: Supine to Sit;Sit to Supine     Supine to sit: Supervision;HOB elevated Sit to supine: Min guard   General bed mobility comments: significant time required, use of bed rails, but no direct physical assist to come sit EOB, CGA for BLE mgt back to bed    Transfers Overall transfer level: Needs assistance Equipment used: Rolling walker (2 wheels) Transfers: Sit to/from Stand Sit to Stand: Min guard           General transfer comment: VC for scooting forward prior to standing     Balance Overall balance assessment: Needs assistance Sitting-balance support: Single extremity supported;No upper extremity supported;Feet supported Sitting balance-Leahy Scale: Fair Sitting balance - Comments: initial poor sitting balance with slight L lateral LOB requiring CGA-MIN A to correct, improved to supv-SBA   Standing balance support: Single extremity supported;Bilateral upper extremity supported;During functional activity;Reliant on assistive device for balance Standing balance-Leahy Scale: Fair Standing balance comment: Requires at least 1 UE support for standing balance while performing grooming tasks at the sink                           ADL either performed or assessed with clinical judgement   ADL Overall ADL's : Needs assistance/impaired  Grooming: Standing;Sitting;Set up;Wash/dry face;Brushing hair Grooming Details (indicate cue type and reason): Pt sat EOB to apply lotion to her face and hands with  supervision for sitting balance, stood wiht UE support on the RW to comb her hair and wash her face, moderate VC and tactile cue to improve upright posture                                    Extremity/Trunk Assessment              Vision       Perception     Praxis      Cognition Arousal/Alertness: Awake/alert Behavior During Therapy: WFL for tasks assessed/performed Overall Cognitive Status: Within Functional Limits for tasks assessed                                            Exercises Other Exercises Other Exercises: Pt/dtr instructed in positioning in the bed for improved comfort as well as sitting more upright during the day to help with lung function; instructed in incentive spirometer requiring additional instruction and return demo to get correct technique, very shallow breaths   Shoulder Instructions       General Comments      Pertinent Vitals/ Pain       Pain Assessment: No/denies pain  Home Living                                          Prior Functioning/Environment              Frequency  Min 2X/week        Progress Toward Goals  OT Goals(current goals can now be found in the care plan section)  Progress towards OT goals: Progressing toward goals  Acute Rehab OT Goals Patient Stated Goal: to go home OT Goal Formulation: With patient Time For Goal Achievement: 02/18/21 Potential to Achieve Goals: Good  Plan Discharge plan remains appropriate;Frequency remains appropriate    Co-evaluation                 AM-PAC OT "6 Clicks" Daily Activity     Outcome Measure   Help from another person eating meals?: None Help from another person taking care of personal grooming?: A Little Help from another person toileting, which includes using toliet, bedpan, or urinal?: A Little Help from another person bathing (including washing, rinsing, drying)?: A Little Help from another person to  put on and taking off regular upper body clothing?: A Little Help from another person to put on and taking off regular lower body clothing?: A Little 6 Click Score: 19    End of Session Equipment Utilized During Treatment: Rolling walker (2 wheels);Gait belt  OT Visit Diagnosis: Muscle weakness (generalized) (M62.81)   Activity Tolerance Patient tolerated treatment well   Patient Left in bed;with call bell/phone within reach;with bed alarm set;with family/visitor present   Nurse Communication Mobility status (dtr requesting stool softener; O2 on RA)        Time: 6967-8938 OT Time Calculation (min): 27 min  Charges: OT General Charges $OT Visit: 1 Visit OT Treatments $Self Care/Home Management : 23-37 mins  Ardeth Perfect., MPH, MS, OTR/L ascom [phone]  02/09/21, 3:11 PM

## 2021-02-09 NOTE — TOC Initial Note (Signed)
Transition of Care [facility]) - Initial/Assessment Note    Patient Details  Name: Frances Kent MRN: [id number] Date of Birth: 28-Oct-1930  [facility] CM/SW Contact:    Alberteen Sam, LCSW Phone Number: 02/09/2021, 2:55 PM  Clinical Narrative:                  CSW spoke with patient's daughter [name] regarding home health services, agreeable to [facility] services with no preference of agency. Referral sent to [facility] with Humberto Seals.   [name] requested list of private care agencies as she may look into hiring additional help, list has been emailed to her.   [name] reports they have all DME needs at home and confirmed patient will be going to home address of [address] in [place] upon discharge.   No further questions or concerns at this time.   Expected Discharge Plan: [place] Barriers to Discharge: Continued Medical Work up   Patient Goals and CMS Choice Patient states their goals for this hospitalization and ongoing recovery are:: to go home CMS Medicare.gov Compare Post Acute Care list provided to:: Patient Represenative (must comment) (daughter) Choice offered to / list presented to : Adult Children  Expected Discharge Plan and Services Expected Discharge Plan: Home w Home Health Services                                   HH Arranged: PT, OT, RN, Nurse's Aide, Social Work [facility] Agency: [place] Date [facility] Agency Contacted: 02/09/21 Time [place]: 1455 Representative spoke with at [place] Arrangements/Services       Do you feel safe going back to the place where you live?: Yes               Activities of Daily Living Home Assistive Devices/Equipment: None ADL Screening (condition at time of admission) Patient's cognitive ability adequate to safely complete daily activities?: Yes Is the patient deaf or have difficulty hearing?: No Does the patient have difficulty seeing, even when  wearing glasses/contacts?: No Does the patient have difficulty concentrating, remembering, or making decisions?: Yes Patient able to express need for assistance with ADLs?: Yes Does the patient have difficulty dressing or bathing?: No Independently performs ADLs?: Yes (appropriate for developmental age) Communication: Needs assistance Does the patient have difficulty walking or climbing stairs?: Yes Weakness of Legs: Both Weakness of Arms/Hands: Both  Permission Sought/Granted                  Emotional Assessment         Alcohol / Substance Use: Not Applicable Psych Involvement: No (comment)  Admission diagnosis:  Shortness of breath [R06.02] HCAP (healthcare-associated pneumonia) [J18.9] Sepsis with acute organ dysfunction and septic shock, due to unspecified organism, unspecified type ([place]) [A41.9, R65.21] Patient Active Problem List   Diagnosis Date Noted   HCAP (healthcare-associated pneumonia) 02/02/2021   Severe sepsis ([place]) 02/02/2021   Depression with anxiety 02/02/2021   Iron deficiency anemia 01/25/2021   Symptomatic anemia 01/13/2021   History of embolic stroke 82/95/6213   Inflamed external hemorrhoid 12/08/2020   Insomnia due to anxiety and fear 12/08/2020   Long term (current) use of anticoagulants    Chronic HFrEF (heart failure with reduced ejection fraction) ([place])    Left basal ganglia embolic stroke ([place]) 08/65/7846   Malnutrition of moderate degree ([place]) 11/03/2020  Prolonged QT interval    Acute on chronic systolic (congestive) heart failure (HCC)    Persistent atrial fibrillation ([place]) 10/30/2020   Antibiotic-associated diarrhea 10/15/2020   ILD (interstitial lung disease) ([place]) 09/22/2020   Atrial flutter, paroxysmal ([place]) 09/10/2020   Tachyarrhythmia 09/08/2020   Sick sinus syndrome ([place]) 09/08/2020   Elevated serum free T4 level 09/08/2020   Acute respiratory failure with hypoxia ([place]) 08/28/2020   Purpura senilis ([place]) 08/28/2020    Hiatal hernia 08/09/2020   Atrial fibrillation with rapid ventricular response ([place]) 08/06/2020   Thrombophilia ([place]) 08/06/2020   Elevated troponin I level 05/21/2020   CVA (cerebral vascular accident) ([place]) 03/24/2020   History of COVID-19 03/03/2020   Bilateral carotid artery stenosis 12/23/2019   Schatzki's ring of distal esophagus 12/11/2019   Erosive gastritis 11/30/2019   Dysphagia 11/27/2019   Painful swallowing 11/27/2019   Gastroesophageal reflux disease 11/27/2019   Stage 3a chronic kidney disease ([place]) 04/11/2019   Coagulopathy ([place]) 09/12/2018   Essential hypertension 02/40/9735   Chronic systolic CHF (congestive heart failure) ([place]) 08/15/2018   Hypothyroidism due to acquired atrophy of thyroid 05/08/2018   Unintentional weight loss 05/08/2018   Myalgia due to statin 05/08/2018   Abdominal aortic atherosclerosis ([place]) 05/07/2018   Bradycardia 04/28/2018   Pleural effusion on left 02/27/2018   Moderate mitral stenosis 01/10/2018   Lumbar radiculitis 01/07/2018   B12 deficiency 07/26/2017   Atrial fibrillation status post cardioversion [facility]) 04/06/2016   Hospital discharge follow-up 03/10/2016   Vitamin D deficiency 04/08/2015   Cervical spine degeneration 12/13/2014   History of pulmonary embolism 12/02/2014   Insomnia 10/04/2014   Moderate tricuspid insufficiency 09/17/2014   Generalized anxiety disorder 03/28/2013   Mitral valve prolapse 03/28/2013   Routine adult health maintenance 03/23/2012   Fatigue 03/23/2012   Cough 04/17/2011   Screening for breast cancer 11/29/2010   Macular degeneration, left eye 11/29/2010   Screening for colon cancer 11/29/2010   Hyperlipidemia LDL goal <70 11/29/2010   GERD (gastroesophageal reflux disease)    PCP:  [name], MD Pharmacy:   [facility] [address], [place] - [place] [address] Phone: [phone] Fax: [phone]  [place] [facility] [place], [place] [address] Phone: [phone] Fax: [phone]  [id number] [place], [place] - [address] AT [place] [address] Phone: [phone] Fax: [phone]  [facility] [address] [place] [address] Phone: [phone] Fax: [phone]     Social Determinants of Health (SDOH) Interventions    Readmission Risk Interventions Readmission Risk Prevention Plan 01/14/2021 11/01/2020  Transportation Screening Complete Complete  PCP or Specialist Appt within 5-7 Days - Complete  Medication Review (RN CM) - Complete  Medication Review Press photographer) Complete -  PCP or Specialist appointment within 3-5 days of discharge Complete -  [place] or Home Care Consult Complete -  SW Recovery Care/Counseling Consult Complete -  Palliative Care Screening Not Applicable -  [place] Not Applicable -  Some recent data might be hidden

## 2021-02-09 NOTE — Progress Notes (Signed)
PROGRESS NOTE    Frances Kent  OBS:[id number] DOB: [birth year] DOA: 02/02/2021 PCP: [name], MD    Brief Narrative:  87 year old female with past medical history significant for essential hypertension, hyperlipidemia, CVA, GERD, hypothyroidism, depression/anxiety, interstitial lung disease, anemia, SSS, SVT, history of pulmonary embolism, proximal atrial fibrillation on Xarelto, mitral valve prolapse, chronic systolic/diastolic congestive heart failure, bilateral carotid artery stenosis who presents to [facility] ED via EMS on 12/20 with progressive shortness of breath.  Patient also reports productive cough with associated fever/chills.  Denies chest pain, no nausea/vomiting/diarrhea, no abdominal pain, no symptoms of urinary tract infection.  On EMS arrival, patient was noted to be desaturating to the 80s with severe respiratory distress, accessory muscle use and placed on CPAP; and subsequently transported to the ED for further evaluation.   In the ED, temperature 100.3 F, HR 83, RR 26, BP 168/78, SPO2 100% on BiPAP with FiO2 60%.  VBG with pH 7.37, PCO2 31, PO2 32.0.  Sodium 133, potassium 4.3, chloride 105, CO2 18, glucose 125, BUN 20, creatinine 1.24.  Total bilirubin 2.8.  BNP elevated 798.7.  Lactic acid 3.0.  WBC 10.8, hemoglobin 10.1, platelets 259.  Procalcitonin 3.17.  COVID-19 PCR negative.  Influenza A/B PCR negative.  Blood cultures x2 ordered.  Chest x-ray with multifocal pneumonia right chest with right-sided effusion, cardiomegaly with vascular congestion.  Patient was started on empiric antibiotics by EDP.  Hospital service consulted for further evaluation management of acute respiratory failure secondary to pneumonia.  Sepsis has resolved.  Patient remains significantly weak and deconditioned.  Requesting repeat evaluations from therapy prior to discharge planning.   Assessment & Plan:   Principal Problem:   HCAP (healthcare-associated pneumonia) Active Problems:    History of pulmonary embolism   Atrial fibrillation status post cardioversion [facility])   Hypothyroidism due to acquired atrophy of thyroid   Chronic systolic CHF (congestive heart failure) (HCC)   Essential hypertension   Stage 3a chronic kidney disease (HCC)   Acute respiratory failure with hypoxia (HCC)   ILD (interstitial lung disease) (HCC)   CVA (cerebral vascular accident) ([place])   Iron deficiency anemia   Severe sepsis ([place])   Depression with anxiety  Sepsis, POA Lactic acidosis: Resolved Community acquired pneumonia, suspect atypical versus gram-negative organism Acute respiratory failure with hypoxia, POA Patient presenting to the ED with progressive shortness of breath, fever/chills and found to be hypoxic on EMS arrival with SPO2 in the 80s.  Patient was confused with associated metabolic encephalopathy and lactic acid elevated at 3.0 likely secondary to commune acquired pneumonia suspicious for atypical versus gram-negative organism.  MRSA PCR negative.  Initially requiring BiPAP to maintain oxygenation. Weaned off BiPAP.  On 2 L.  Encephalopathy improved. --lactic acid 3.0>1.8 --WBC 10.8>8.3>9.1>7.6 --Blood cultures x2: No growth to date --Strep pneumonia/Legionella urinary antigen: Not collected -- Completed 6-day course of IV cefepime on 12/26 Plan: Last dose of IV cefepime today.  Completed 6-day course Completed azithromycin, no need to restart Antitussives and mucolytic's as needed Incentive spirometry and flutter valve Wean nasal cannula as tolerated Monitor vitals and fever curve Possible discharge in 24 to 48 hours if able to wean from oxygen Remains on 2 L  no further IV fluids  Weakness/deconditioning/debility: has been persistent.   Suspect related to poor p.o. intake and diminished nutritional status. Recommendations were made for home with home health Plan: Monitor for the next 24 hours.  Ensure good oral intake.  Encourage p.o. fluid intake.  Consider  discharge  in 24 hours.  Consider reevaluation by physical therapy.   Acute metabolic encephalopathy, POA, improved Upon presentation, patient was noted to be confused.  Etiology likely secondary to sepsis from underlying pneumonia with hypoxia.  Patient was started on BiPAP with improvement of SPO2 and empiric antibiotics with resolution of encephalopathy. --Continue supportive care --Treatment as above  Acute kidney injury Now improving Plan: hold Lasix No IVF Daily BMP  Essential hypertension Chronic systolic/diastolic congestive heart failure On furosemide 20 mg p.o. daily at home.  Follows with cardiology outpatient, Dr. [name]. On presentation, admitted with sepsis as above with chest x-ray with pulmonary vascular congestion and elevated BNP of 798.7.  TTE 11/01/2020 with LVEF 40-45%, LV mildly decreased function, no LV regional wall motion abnormalities, LA/RA moderately dilated, moderate/severe MR, moderate TR, no aortic stenosis, IVC normal in size. -No evidence of acute exacerbation Plan: Lasix on hold No IVF Strict ins and outs, daily weights    Hyperlipidemia --Not on statin outpatient   Hypothyroidism --Levothyroxine 50 mcg p.o. daily   Depression/anxiety --Alprazolam 0.25 mg qHS PRN   Hx Interstitial lung disease --Albuterol neb as needed --Supplemental oxygen as above --Supportive care --Outpatient follow-up with pulmonology   Anemia --Ferrous sulfate 325 mg p.o. twice daily   Hx pulmonary embolism Paroxysmal atrial fibrillation  --Amiodarone 100 mg p.o. twice daily --Xarelto 15 mg p.o. daily    Bilateral carotid artery stenosis Outpatient follow-up with cardiology/vascular surgery      DVT prophylaxis: Xarelto Code Status: Partial Family Communication: Daughter [name] [phone] on 12/24, 12/27 Disposition Plan: Status is: Inpatient  Remains inpatient appropriate because: Multifocal pneumonia.  Hypoxic respiratory failure.  AKI.  Slowly  resolving but patient significantly weak and debilitated.  Anticipate discharge in 24 to 48 hours.       Level of care: Progressive  Consultants:  None  Procedures:  None  Antimicrobials: Cefepime   Subjective: Patient seen and examined.  Endorses lethargy and fatigue.  No pain complaints  Objective: Vitals:   02/09/21 0340 02/09/21 0600 02/09/21 0749 02/09/21 1234  BP: (!) 128/57  (!) 140/50 (!) 125/57  Pulse: 70  70 (!) 58  Resp: 17   18  Temp: 98.5 F (36.9 C)  98.5 F (36.9 C) (!) 97.5 F (36.4 C)  TempSrc:    Oral  SpO2: 99%  99% 100%  Weight:  50.7 kg    Height:        Intake/Output Summary (Last 24 hours) at 02/09/2021 1243 Last data filed at 02/09/2021 0600 Gross per 24 hour  Intake --  Output 500 ml  Net -500 ml   Filed Weights   02/05/21 0422 02/06/21 0500 02/09/21 0600  Weight: 50.3 kg 49.2 kg 50.7 kg    Examination:  General exam: No acute distress.  Appears weak and fatigued Respiratory system: Coarse breath sounds at bases.  Normal work of breathing.  2 L Cardiovascular system: S1-S2, RRR, no murmurs, no pedal edema Gastrointestinal system: Soft, NT/ND, normal bowel sounds Central nervous system: Alert and oriented. No focal neurological deficits. Extremities: Symmetric 5 x 5 power. Skin: No rashes, lesions or ulcers Psychiatry: Judgement and insight appear normal. Mood & affect appropriate.     Data Reviewed: I have personally reviewed following labs and imaging studies  CBC: Recent Labs  Lab 02/04/21 0643 02/05/21 0458 02/06/21 0615 02/08/21 0802 02/09/21 0533  WBC 9.1 7.6 7.3 8.5 8.6  NEUTROABS  --   --   --  5.1  --   HGB 9.9*  9.2* 10.7* 10.5* 10.0*  HCT 31.7* 30.0* 34.5* 34.3* 32.5*  MCV 83.4 84.3 84.6 84.3 83.5  PLT 264 230 270 319 124   Basic Metabolic Panel: Recent Labs  Lab 02/04/21 0643 02/05/21 0458 02/06/21 0615 02/08/21 0802 02/09/21 0533  NA 137 136 138 136 136  K 4.6 3.9 3.2* 4.6 4.4  CL 109 106 103  108 108  CO2 20* 24 27 25 23   GLUCOSE 133* 96 102* 90 85  BUN 29* 26* 31* 27* 22  CREATININE 0.96 1.01* 1.24* 1.04* 0.92  CALCIUM 9.0 8.4* 8.5* 8.7* 8.3*  MG  --   --   --  2.2  --    GFR: Estimated Creatinine Clearance: 33.8 mL/min (by C-G formula based on SCr of 0.92 mg/dL). Liver Function Tests: No results for input(s): AST, ALT, ALKPHOS, BILITOT, PROT, ALBUMIN in the last 168 hours.  No results for input(s): LIPASE, AMYLASE in the last 168 hours. No results for input(s): AMMONIA in the last 168 hours. Coagulation Profile: No results for input(s): INR, PROTIME in the last 168 hours. Cardiac Enzymes: No results for input(s): CKTOTAL, CKMB, CKMBINDEX, TROPONINI in the last 168 hours. BNP (last 3 results) No results for input(s): PROBNP in the last 8760 hours. HbA1C: No results for input(s): HGBA1C in the last 72 hours. CBG: No results for input(s): GLUCAP in the last 168 hours. Lipid Profile: No results for input(s): CHOL, HDL, LDLCALC, TRIG, CHOLHDL, LDLDIRECT in the last 72 hours. Thyroid Function Tests: No results for input(s): TSH, T4TOTAL, FREET4, T3FREE, THYROIDAB in the last 72 hours. Anemia Panel: No results for input(s): VITAMINB12, FOLATE, FERRITIN, TIBC, IRON, RETICCTPCT in the last 72 hours. Sepsis Labs: Recent Labs  Lab 02/04/21 5809 02/05/21 0458 02/06/21 0615 02/07/21 0549  PROCALCITON 5.90 3.82 2.73 1.43    Recent Results (from the past 240 hour(s))  Resp Panel by RT-PCR (Flu A&B, Covid) Nasopharyngeal Swab     Status: None   Collection Time: 02/02/21  9:27 AM   Specimen: Nasopharyngeal Swab; Nasopharyngeal(NP) swabs in vial transport medium  Result Value Ref Range Status   SARS Coronavirus 2 by RT PCR NEGATIVE NEGATIVE Final    Comment: (NOTE) SARS-CoV-2 target nucleic acids are NOT DETECTED.  The SARS-CoV-2 RNA is generally detectable in upper respiratory specimens during the acute phase of infection. The lowest concentration of SARS-CoV-2 viral  copies this assay can detect is 138 copies/mL. A negative result does not preclude SARS-Cov-2 infection and should not be used as the sole basis for treatment or other patient management decisions. A negative result may occur with  improper specimen collection/handling, submission of specimen other than nasopharyngeal swab, presence of viral mutation(s) within the areas targeted by this assay, and inadequate number of viral copies(<138 copies/mL). A negative result must be combined with clinical observations, patient history, and epidemiological information. The expected result is Negative.  Fact Sheet for Patients:  EntrepreneurPulse.com.au  Fact Sheet for Healthcare Providers:  IncredibleEmployment.be  This test is no t yet approved or cleared by the [place] FDA and  has been authorized for detection and/or diagnosis of SARS-CoV-2 by FDA under an Emergency Use Authorization (EUA). This EUA will remain  in effect (meaning this test can be used) for the duration of the COVID-19 declaration under Section 564(b)(1) of the Act, 21 U.S.C.section 360bbb-3(b)(1), unless the authorization is terminated  or revoked sooner.       Influenza A by PCR NEGATIVE NEGATIVE Final   Influenza B by PCR NEGATIVE NEGATIVE  Final    Comment: (NOTE) The Xpert Xpress SARS-CoV-2/FLU/RSV plus assay is intended as an aid in the diagnosis of influenza from Nasopharyngeal swab specimens and should not be used as a sole basis for treatment. Nasal washings and aspirates are unacceptable for Xpert Xpress SARS-CoV-2/FLU/RSV testing.  Fact Sheet for Patients: EntrepreneurPulse.com.au  Fact Sheet for Healthcare Providers: IncredibleEmployment.be  This test is not yet approved or cleared by the [place] FDA and has been authorized for detection and/or diagnosis of SARS-CoV-2 by FDA under an Emergency Use Authorization (EUA). This  EUA will remain in effect (meaning this test can be used) for the duration of the COVID-19 declaration under Section 564(b)(1) of the Act, 21 U.S.C. section 360bbb-3(b)(1), unless the authorization is terminated or revoked.  Performed at [facility], [address] [place], [place] [address]   Blood culture (routine x 2)     Status: None   Collection Time: 02/02/21 10:31 AM   Specimen: BLOOD  Result Value Ref Range Status   Specimen Description BLOOD RIGHT FA  Final   Special Requests   Final    BOTTLES DRAWN AEROBIC AND ANAEROBIC Blood Culture adequate volume   Culture   Final    NO GROWTH 5 DAYS Performed at [facility], [address], [place], [place] [address]    Report Status 02/07/2021 FINAL  Final  Blood culture (routine x 2)     Status: None   Collection Time: 02/02/21 10:31 AM   Specimen: BLOOD  Result Value Ref Range Status   Specimen Description BLOOD RIGHT HAND  Final   Special Requests   Final    BOTTLES DRAWN AEROBIC AND ANAEROBIC Blood Culture adequate volume   Culture   Final    NO GROWTH 5 DAYS Performed at [facility], [address] [place], [place] [address]    Report Status 02/07/2021 FINAL  Final  MRSA Next Gen by PCR, Nasal     Status: None   Collection Time: 02/03/21  9:32 AM   Specimen: Nasal Mucosa; Nasal Swab  Result Value Ref Range Status   MRSA by PCR Next Gen NOT DETECTED NOT DETECTED Final    Comment: (NOTE) The GeneXpert MRSA Assay (FDA approved for NASAL specimens only), is one component of a comprehensive MRSA colonization surveillance program. It is not intended to diagnose MRSA infection nor to guide or monitor treatment for MRSA infections. Test performance is not FDA approved in patients less than 32 years old. Performed at [facility], [address], [place], [place] [address]          Radiology Studies: [facility] Chest [place] 1 View  Result Date: 02/08/2021 CLINICAL DATA:  Shortness  of breath. EXAM: PORTABLE CHEST 1 VIEW COMPARISON:  02/05/2021 FINDINGS: 0906 hours. The cardio pericardial silhouette is enlarged. Vascular congestion with diffuse interstitial opacity is similar to prior. Interval improvement in right base airspace disease. The visualized bony structures of the thorax show no acute abnormality. Telemetry leads overlie the chest. IMPRESSION: Interval improvement in right base airspace disease. Electronically Signed   By: Misty Stanley M.D.   On: 02/08/2021 09:59        Scheduled Meds:  amiodarone  100 mg Oral BID   benzonatate  200 mg Oral TID   feeding supplement  237 mL Oral BID BM   ferrous sulfate  325 mg Oral BID   guaiFENesin  600 mg Oral BID   levothyroxine  50 mcg Oral Q0600   mirtazapine  7.5 mg Oral QHS   multivitamin with minerals  1 tablet Oral Daily   Rivaroxaban  15 mg Oral Q supper   Continuous Infusions:  sodium chloride Stopped (02/06/21 1119)     LOS: 7 days    Time spent: 25 minutes    Sidney Ace, MD [facility]   If 7PM-7AM, please contact night-coverage  02/09/2021, 12:43 PM

## 2021-02-10 ENCOUNTER — Telehealth: Payer: Self-pay | Admitting: Internal Medicine

## 2021-02-10 ENCOUNTER — Inpatient Hospital Stay: Payer: Medicare HMO | Attending: Oncology | Admitting: Oncology

## 2021-02-10 ENCOUNTER — Other Ambulatory Visit: Payer: Self-pay

## 2021-02-10 ENCOUNTER — Inpatient Hospital Stay: Payer: Medicare HMO

## 2021-02-10 MED ORDER — LEVOTHYROXINE SODIUM 50 MCG PO TABS: 50.0000 ug | ORAL_TABLET | Freq: Every day | ORAL | 2 refills | Status: DC

## 2021-02-10 NOTE — Telephone Encounter (Signed)
Pts daughter called in regards to pt being hospitalized from 12/20-12/28. Pt is scheduled for hosp f/u 1/6 with PCP. Pts daughter states they came home today 12/28 however pt is still feeling extremely fatigued which has her concerned for pt. Pts daughter was advised by ED that it could be the result of pt taking antibiotics. In home health aide is supposed to come either next week or the week after for pt. Pt also has a cough that is persistent. Daughter is wondering if pt needs to be put on a different cough medication or does she need to be doing anything else differently for pt. Pts medications were not changed in ED. Pts daughter would just like to know what exactly she needs to be doing for pt to help care for her.

## 2021-02-10 NOTE — Discharge Summary (Signed)
Physician Discharge Summary  Frances Kent VOH:[id number] DOB: [birth year] DOA: 02/02/2021  PCP: [name], MD  Admit date: 02/02/2021 Discharge date: 02/10/2021  Admitted From: Home Disposition: Home  Recommendations for Outpatient Follow-up:  Follow up with PCP in 1-2 weeks Please obtain BMP in one week to ensure renal function stable Outpatient follow-up with cardiology/vascular surgery for bilateral carotid artery stenosis.  Home Health: PT/OT/RN/aide/social work Equipment/Devices: None  Discharge Condition: Stable CODE STATUS: DNI Diet recommendation: Heart healthy diet  History of present illness:  Frances Kent is a 87 year old female with past medical history significant for essential hypertension, hyperlipidemia, CVA, GERD, hypothyroidism, depression/anxiety, interstitial lung disease, anemia, SSS, SVT, history of pulmonary embolism, proximal atrial fibrillation on Xarelto, mitral valve prolapse, chronic systolic/diastolic congestive heart failure, bilateral carotid artery stenosis who presents to [facility] ED via EMS on 12/20 with progressive shortness of breath.  Patient also reports productive cough with associated fever/chills.  Denies chest pain, no nausea/vomiting/diarrhea, no abdominal pain, no symptoms of urinary tract infection.  On EMS arrival, patient was noted to be desaturating to the 80s with severe respiratory distress, accessory muscle use and placed on CPAP; and subsequently transported to the ED for further evaluation.   In the ED, temperature 100.3 F, HR 83, RR 26, BP 168/78, SPO2 100% on BiPAP with FiO2 60%.  VBG with pH 7.37, PCO2 31, PO2 32.0.  Sodium 133, potassium 4.3, chloride 105, CO2 18, glucose 125, BUN 20, creatinine 1.24.  Total bilirubin 2.8.  BNP elevated 798.7.  Lactic acid 3.0.  WBC 10.8, hemoglobin 10.1, platelets 259.  Procalcitonin 3.17.  COVID-19 PCR negative.  Influenza A/B PCR negative.  Blood cultures x2 ordered.  Chest x-ray with  multifocal pneumonia right chest with right-sided effusion, cardiomegaly with vascular congestion.  Patient was started on empiric antibiotics by EDP.  Hospitalist service consulted for further evaluation management of acute respiratory failure secondary to pneumonia.    Hospital course:  Sepsis, POA Lactic acidosis: Resolved Community acquired pneumonia, suspect atypical versus gram-negative organism Acute respiratory failure with hypoxia, POA Patient presenting to the ED with progressive shortness of breath, fever/chills and found to be hypoxic on EMS arrival with SPO2 in the 80s.  Patient was confused with associated metabolic encephalopathy and lactic acid elevated at 3.0 likely secondary to commune acquired pneumonia suspicious for atypical versus gram-negative organism.  MRSA PCR negative.  Initially requiring BiPAP to maintain oxygenation; which was weaned off as well as supplemental oxygen.  Lactic acid improved from 3.0-1.8 with WBC count improving from 10.8-7.6.  Blood cultures showed no growth.  Completed course of azithromycin and cefepime. Weaned off BiPAP.  On 2 L.  Encephalopathy improved.   Weakness/deconditioning/debility: Suspect related to poor p.o. intake and diminished nutritional status.  Patient was seen by PT/OT with recommendations of home health.  Acute metabolic encephalopathy, POA: Resolved Upon presentation, patient was noted to be confused.  Etiology likely secondary to sepsis from underlying pneumonia with hypoxia.  Patient was started on BiPAP with improvement of SPO2 and empiric antibiotics with resolution of encephalopathy.  Acute renal failure: Resolved Etiology likely secondary to sepsis as above with underlying prerenal azotemia versus ATN.  Renal function improved and was 0.92 at time of discharge.  Recommend repeat BMP 1 week at next PCP visit.   Essential hypertension Chronic systolic/diastolic congestive heart failure On furosemide 20 mg p.o. daily at  home.  Follows with cardiology outpatient, Dr. [name]. On presentation, admitted with sepsis as above with chest x-ray with  pulmonary vascular congestion and elevated BNP of 798.7.  TTE 11/01/2020 with LVEF 40-45%, LV mildly decreased function, no LV regional wall motion abnormalities, LA/RA moderately dilated, moderate/severe MR, moderate TR, no aortic stenosis, IVC normal in size.  Resume home furosemide.    Hyperlipidemia Not on statin outpatient   Hypothyroidism Levothyroxine 50 mcg p.o. daily   Depression/anxiety Alprazolam 0.25 mg qHS PRN   Hx Interstitial lung disease Outpatient follow-up with pulmonology   Anemia Ferrous sulfate 325 mg p.o. twice daily   Hx pulmonary embolism Paroxysmal atrial fibrillation  Amiodarone 100 mg p.o. twice daily, Xarelto 15 mg p.o. daily     Bilateral carotid artery stenosis Outpatient follow-up with cardiology/vascular surgery  Discharge Diagnoses:  Principal Problem:   HCAP (healthcare-associated pneumonia) Active Problems:   History of pulmonary embolism   Atrial fibrillation status post cardioversion [facility])   Hypothyroidism due to acquired atrophy of thyroid   Chronic systolic CHF (congestive heart failure) ([place])   Essential hypertension   Stage 3a chronic kidney disease (HCC)   Acute respiratory failure with hypoxia (HCC)   ILD (interstitial lung disease) ([place])   CVA (cerebral vascular accident) ([place])   Iron deficiency anemia   Severe sepsis ([place])   Depression with anxiety    Discharge Instructions  Discharge Instructions     Call MD for:  difficulty breathing, headache or visual disturbances   Complete by: As directed    Call MD for:  extreme fatigue   Complete by: As directed    Call MD for:  persistant dizziness or light-headedness   Complete by: As directed    Call MD for:  persistant nausea and vomiting   Complete by: As directed    Call MD for:  severe uncontrolled pain   Complete by: As directed    Call MD  for:  temperature >100.4   Complete by: As directed    Diet - low sodium heart healthy   Complete by: As directed    Increase activity slowly   Complete by: As directed    No wound care   Complete by: As directed       Allergies as of 02/10/2021       Reactions   Statins    Severe myalgias        Medication List     TAKE these medications    acetaminophen 500 MG tablet Commonly known as: TYLENOL Take 500 mg by mouth every 6 (six) hours as needed.   ALPRAZolam 0.25 MG tablet Commonly known as: XANAX Take 1 tablet (0.25 mg total) by mouth at bedtime as needed for anxiety.   amiodarone 200 MG tablet Commonly known as: PACERONE Take 1/2 tablet (100 mg total) by mouth daily. What changed: when to take this   ferrous sulfate 325 (65 FE) MG EC tablet Take 1 tablet (325 mg total) by mouth 2 (two) times daily.   furosemide 20 MG tablet Commonly known as: LASIX Take 2 tablets (40 mg total) by mouth daily.   levothyroxine 50 MCG tablet Commonly known as: Euthyrox Take 1 tablet (50 mcg total) by mouth daily at 6 (six) AM.   mirtazapine 7.5 MG tablet Commonly known as: REMERON Take 1 tablet (7.5 mg total) by mouth at bedtime.   multivitamin with minerals Tabs tablet Take 1 tablet by mouth daily.   senna-docusate 8.6-50 MG tablet Commonly known as: Senokot-S Take 1 tablet by mouth 2 (two) times daily between meals as needed for mild constipation.   Xarelto 15  MG Tabs tablet Generic drug: Rivaroxaban Take 1 tablet (15 mg total) by mouth daily with supper.        Follow-up Information     [name], MD. Schedule an appointment as soon as possible for a visit in 1 week(s).   Specialty: Internal Medicine Contact information: [address] [place] [address] [phone]                Allergies  Allergen Reactions   Statins     Severe myalgias    Consultations: None   Procedures/Studies: DG Chest 2 View  Result  Date: 01/12/2021 CLINICAL DATA:  Chest pain EXAM: CHEST - 2 VIEW COMPARISON:  Chest x-ray dated November 07, 2020 FINDINGS: Unchanged cardiac and mediastinal contours. Mild bilateral interstitial opacities. Trace bilateral pleural effusions. No evidence of pneumothorax. IMPRESSION: 1. Mild bilateral interstitial opacities, likely due to pulmonary edema. 2. Trace bilateral pleural effusions. Electronically Signed   By: Yetta Glassman M.D.   On: 01/12/2021 10:34   DG Chest Port 1 View  Result Date: 02/08/2021 CLINICAL DATA:  Shortness of breath. EXAM: PORTABLE CHEST 1 VIEW COMPARISON:  02/05/2021 FINDINGS: 0906 hours. The cardio pericardial silhouette is enlarged. Vascular congestion with diffuse interstitial opacity is similar to prior. Interval improvement in right base airspace disease. The visualized bony structures of the thorax show no acute abnormality. Telemetry leads overlie the chest. IMPRESSION: Interval improvement in right base airspace disease. Electronically Signed   By: Misty Stanley M.D.   On: 02/08/2021 09:59   DG Chest Port 1 View  Result Date: 02/05/2021 CLINICAL DATA:  Shortness of breath. EXAM: PORTABLE CHEST 1 VIEW COMPARISON:  Chest x-ray dated February 02, 2021. FINDINGS: Stable cardiomegaly. Patchy consolidation in the right lower lobe has mildly improved. Unchanged small right pleural effusion. No pneumothorax. No acute osseous abnormality. IMPRESSION: 1. Mildly improved right lower lobe pneumonia. Electronically Signed   By: Titus Dubin M.D.   On: 02/05/2021 08:09   DG Chest Portable 1 View  Result Date: 02/02/2021 CLINICAL DATA:  Shortness of breath in a female at age 57. EXAM: PORTABLE CHEST 1 VIEW COMPARISON:  January 12, 2021. FINDINGS: EKG leads project over the chest. Trachea midline. Heart size is enlarged as before. Graded opacity suggested at the RIGHT lung base. Marked increased density about the RIGHT mid chest compared to the previous exam and scattered  multifocal opacities including RIGHT lower lobe in the RIGHT retrocardiac region. Mild increased interstitial prominence in the LEFT chest. No visible pneumothorax. On limited assessment there is no acute skeletal process. IMPRESSION: Findings suspicious for multifocal pneumonia in the RIGHT chest potentially associated with RIGHT-sided effusion. Cardiomegaly with signs of vascular congestion. Also correlate with any signs of heart failure. Electronically Signed   By: Zetta Bills M.D.   On: 02/02/2021 10:03     Subjective: Patient seen examined at bedside, resting comfortably.  Now titrated off oxygen.  No specific complaints or concerns other than generalized weakness.  Updated patient's daughter, [name] via telephone on discharge home today with home health therapy.  No other questions or concerns at this time.  Denies headache, no chest pain, no shortness of breath, no abdominal pain, no fever/chills/night sweats, no nausea/vomiting/diarrhea, no paresthesias.  No acute events overnight per nursing staff.  Discharge Exam: Vitals:   02/10/21 0344 02/10/21 0827  BP: (!) 133/50 (!) 115/57  Pulse: 73 71  Resp: 18 16  Temp: 98.3 F (36.8 C) 99.7 F (37.6 C)  SpO2:  98% 99%   Vitals:   02/10/21 0005 02/10/21 0344 02/10/21 0500 02/10/21 0827  BP: (!) 134/56 (!) 133/50  (!) 115/57  Pulse: 74 73  71  Resp: 17 18  16   Temp: 98 F (36.7 C) 98.3 F (36.8 C)  99.7 F (37.6 C)  TempSrc:  Oral    SpO2: 98% 98%  99%  Weight:   49.4 kg   Height:        General: Pt is alert, awake, not in acute distress, elderly in appearance Cardiovascular: RRR, S1/S2 +, no rubs, no gallops Respiratory: CTA bilaterally, no wheezing, no rhonchi, on room air Abdominal: Soft, NT, ND, bowel sounds + Extremities: no edema, no cyanosis    The results of significant diagnostics from this hospitalization (including imaging, microbiology, ancillary and laboratory) are listed below for reference.      Microbiology: Recent Results (from the past 240 hour(s))  Resp Panel by RT-PCR (Flu A&B, Covid) Nasopharyngeal Swab     Status: None   Collection Time: 02/02/21  9:27 AM   Specimen: Nasopharyngeal Swab; Nasopharyngeal(NP) swabs in vial transport medium  Result Value Ref Range Status   SARS Coronavirus 2 by RT PCR NEGATIVE NEGATIVE Final    Comment: (NOTE) SARS-CoV-2 target nucleic acids are NOT DETECTED.  The SARS-CoV-2 RNA is generally detectable in upper respiratory specimens during the acute phase of infection. The lowest concentration of SARS-CoV-2 viral copies this assay can detect is 138 copies/mL. A negative result does not preclude SARS-Cov-2 infection and should not be used as the sole basis for treatment or other patient management decisions. A negative result may occur with  improper specimen collection/handling, submission of specimen other than nasopharyngeal swab, presence of viral mutation(s) within the areas targeted by this assay, and inadequate number of viral copies(<138 copies/mL). A negative result must be combined with clinical observations, patient history, and epidemiological information. The expected result is Negative.  Fact Sheet for Patients:  EntrepreneurPulse.com.au  Fact Sheet for Healthcare Providers:  IncredibleEmployment.be  This test is no t yet approved or cleared by the [place] FDA and  has been authorized for detection and/or diagnosis of SARS-CoV-2 by FDA under an Emergency Use Authorization (EUA). This EUA will remain  in effect (meaning this test can be used) for the duration of the COVID-19 declaration under Section 564(b)(1) of the Act, 21 U.S.C.section 360bbb-3(b)(1), unless the authorization is terminated  or revoked sooner.       Influenza A by PCR NEGATIVE NEGATIVE Final   Influenza B by PCR NEGATIVE NEGATIVE Final    Comment: (NOTE) The Xpert Xpress SARS-CoV-2/FLU/RSV plus assay is  intended as an aid in the diagnosis of influenza from Nasopharyngeal swab specimens and should not be used as a sole basis for treatment. Nasal washings and aspirates are unacceptable for Xpert Xpress SARS-CoV-2/FLU/RSV testing.  Fact Sheet for Patients: EntrepreneurPulse.com.au  Fact Sheet for Healthcare Providers: IncredibleEmployment.be  This test is not yet approved or cleared by the [place] FDA and has been authorized for detection and/or diagnosis of SARS-CoV-2 by FDA under an Emergency Use Authorization (EUA). This EUA will remain in effect (meaning this test can be used) for the duration of the COVID-19 declaration under Section 564(b)(1) of the Act, 21 U.S.C. section 360bbb-3(b)(1), unless the authorization is terminated or revoked.  Performed at [facility], [address] [place], [place] [address]   Blood culture (routine x 2)     Status: None   Collection Time: 02/02/21 10:31 AM  Specimen: BLOOD  Result Value Ref Range Status   Specimen Description BLOOD RIGHT FA  Final   Special Requests   Final    BOTTLES DRAWN AEROBIC AND ANAEROBIC Blood Culture adequate volume   Culture   Final    NO GROWTH 5 DAYS Performed at [facility], [address] [place], [place] [address]    Report Status 02/07/2021 FINAL  Final  Blood culture (routine x 2)     Status: None   Collection Time: 02/02/21 10:31 AM   Specimen: BLOOD  Result Value Ref Range Status   Specimen Description BLOOD RIGHT HAND  Final   Special Requests   Final    BOTTLES DRAWN AEROBIC AND ANAEROBIC Blood Culture adequate volume   Culture   Final    NO GROWTH 5 DAYS Performed at [facility], [address] [place], [place] [address]    Report Status 02/07/2021 FINAL  Final  MRSA Next Gen by PCR, Nasal     Status: None   Collection Time: 02/03/21  9:32 AM   Specimen: Nasal Mucosa; Nasal Swab  Result Value Ref Range Status   MRSA  by PCR Next Gen NOT DETECTED NOT DETECTED Final    Comment: (NOTE) The GeneXpert MRSA Assay (FDA approved for NASAL specimens only), is one component of a comprehensive MRSA colonization surveillance program. It is not intended to diagnose MRSA infection nor to guide or monitor treatment for MRSA infections. Test performance is not FDA approved in patients less than 35 years old. Performed at [facility], [address] [place], [place] [address]      Labs: BNP (last 3 results) Recent Labs    01/14/21 0422 01/15/21 0549 02/02/21 0926  BNP 571.3* 502.7* 341.9*   Basic Metabolic Panel: Recent Labs  Lab 02/04/21 0643 02/05/21 0458 02/06/21 0615 02/08/21 0802 02/09/21 0533  NA 137 136 138 136 136  K 4.6 3.9 3.2* 4.6 4.4  CL 109 106 103 108 108  CO2 20* 24 27 25 23   GLUCOSE 133* 96 102* 90 85  BUN 29* 26* 31* 27* 22  CREATININE 0.96 1.01* 1.24* 1.04* 0.92  CALCIUM 9.0 8.4* 8.5* 8.7* 8.3*  MG  --   --   --  2.2  --    Liver Function Tests: No results for input(s): AST, ALT, ALKPHOS, BILITOT, PROT, ALBUMIN in the last 168 hours. No results for input(s): LIPASE, AMYLASE in the last 168 hours. No results for input(s): AMMONIA in the last 168 hours. CBC: Recent Labs  Lab 02/04/21 0643 02/05/21 0458 02/06/21 0615 02/08/21 0802 02/09/21 0533  WBC 9.1 7.6 7.3 8.5 8.6  NEUTROABS  --   --   --  5.1  --   HGB 9.9* 9.2* 10.7* 10.5* 10.0*  HCT 31.7* 30.0* 34.5* 34.3* 32.5*  MCV 83.4 84.3 84.6 84.3 83.5  PLT 264 230 270 319 367   Cardiac Enzymes: No results for input(s): CKTOTAL, CKMB, CKMBINDEX, TROPONINI in the last 168 hours. BNP: Invalid input(s): POCBNP CBG: No results for input(s): GLUCAP in the last 168 hours. D-Dimer No results for input(s): DDIMER in the last 72 hours. Hgb A1c No results for input(s): HGBA1C in the last 72 hours. Lipid Profile No results for input(s): CHOL, HDL, LDLCALC, TRIG, CHOLHDL, LDLDIRECT in the last 72 hours. Thyroid  function studies No results for input(s): TSH, T4TOTAL, T3FREE, THYROIDAB in the last 72 hours.  Invalid input(s): FREET3 Anemia work up No results for input(s): VITAMINB12, FOLATE, FERRITIN,  TIBC, IRON, RETICCTPCT in the last 72 hours. Urinalysis    Component Value Date/Time   COLORURINE YELLOW (A) 09/08/2020 0105   APPEARANCEUR HAZY (A) 09/08/2020 0105   APPEARANCEUR Clear 02/05/2013 1630   LABSPEC 1.024 09/08/2020 0105   LABSPEC 1.009 02/05/2013 1630   PHURINE 6.0 09/08/2020 0105   GLUCOSEU NEGATIVE 09/08/2020 0105   GLUCOSEU NEGATIVE 04/04/2018 1149   HGBUR NEGATIVE 09/08/2020 0105   BILIRUBINUR NEGATIVE 09/08/2020 0105   BILIRUBINUR 1 08/31/2017 1157   BILIRUBINUR Negative 02/05/2013 1630   KETONESUR 5 (A) 09/08/2020 0105   PROTEINUR 100 (A) 09/08/2020 0105   UROBILINOGEN 1.0 04/04/2018 1149   NITRITE NEGATIVE 09/08/2020 0105   LEUKOCYTESUR NEGATIVE 09/08/2020 0105   LEUKOCYTESUR 1+ 02/05/2013 1630   Sepsis Labs Invalid input(s): PROCALCITONIN,  WBC,  LACTICIDVEN Microbiology Recent Results (from the past 240 hour(s))  Resp Panel by RT-PCR (Flu A&B, Covid) Nasopharyngeal Swab     Status: None   Collection Time: 02/02/21  9:27 AM   Specimen: Nasopharyngeal Swab; Nasopharyngeal(NP) swabs in vial transport medium  Result Value Ref Range Status   SARS Coronavirus 2 by RT PCR NEGATIVE NEGATIVE Final    Comment: (NOTE) SARS-CoV-2 target nucleic acids are NOT DETECTED.  The SARS-CoV-2 RNA is generally detectable in upper respiratory specimens during the acute phase of infection. The lowest concentration of SARS-CoV-2 viral copies this assay can detect is 138 copies/mL. A negative result does not preclude SARS-Cov-2 infection and should not be used as the sole basis for treatment or other patient management decisions. A negative result may occur with  improper specimen collection/handling, submission of specimen other than nasopharyngeal swab, presence of viral mutation(s)  within the areas targeted by this assay, and inadequate number of viral copies(<138 copies/mL). A negative result must be combined with clinical observations, patient history, and epidemiological information. The expected result is Negative.  Fact Sheet for Patients:  EntrepreneurPulse.com.au  Fact Sheet for Healthcare Providers:  IncredibleEmployment.be  This test is no t yet approved or cleared by the [place] FDA and  has been authorized for detection and/or diagnosis of SARS-CoV-2 by FDA under an Emergency Use Authorization (EUA). This EUA will remain  in effect (meaning this test can be used) for the duration of the COVID-19 declaration under Section 564(b)(1) of the Act, 21 U.S.C.section 360bbb-3(b)(1), unless the authorization is terminated  or revoked sooner.       Influenza A by PCR NEGATIVE NEGATIVE Final   Influenza B by PCR NEGATIVE NEGATIVE Final    Comment: (NOTE) The Xpert Xpress SARS-CoV-2/FLU/RSV plus assay is intended as an aid in the diagnosis of influenza from Nasopharyngeal swab specimens and should not be used as a sole basis for treatment. Nasal washings and aspirates are unacceptable for Xpert Xpress SARS-CoV-2/FLU/RSV testing.  Fact Sheet for Patients: EntrepreneurPulse.com.au  Fact Sheet for Healthcare Providers: IncredibleEmployment.be  This test is not yet approved or cleared by the [place] FDA and has been authorized for detection and/or diagnosis of SARS-CoV-2 by FDA under an Emergency Use Authorization (EUA). This EUA will remain in effect (meaning this test can be used) for the duration of the COVID-19 declaration under Section 564(b)(1) of the Act, 21 U.S.C. section 360bbb-3(b)(1), unless the authorization is terminated or revoked.  Performed at [facility], [address] [place], [place] [address]   Blood culture (routine x 2)     Status: None    Collection Time: 02/02/21 10:31 AM   Specimen: BLOOD  Result Value Ref Range  Status   Specimen Description BLOOD RIGHT FA  Final   Special Requests   Final    BOTTLES DRAWN AEROBIC AND ANAEROBIC Blood Culture adequate volume   Culture   Final    NO GROWTH 5 DAYS Performed at [facility], [address] [place], [place] [address]    Report Status 02/07/2021 FINAL  Final  Blood culture (routine x 2)     Status: None   Collection Time: 02/02/21 10:31 AM   Specimen: BLOOD  Result Value Ref Range Status   Specimen Description BLOOD RIGHT HAND  Final   Special Requests   Final    BOTTLES DRAWN AEROBIC AND ANAEROBIC Blood Culture adequate volume   Culture   Final    NO GROWTH 5 DAYS Performed at [name], [address] [place], [place] [address]    Report Status 02/07/2021 FINAL  Final  MRSA Next Gen by PCR, Nasal     Status: None   Collection Time: 02/03/21  9:32 AM   Specimen: Nasal Mucosa; Nasal Swab  Result Value Ref Range Status   MRSA by PCR Next Gen NOT DETECTED NOT DETECTED Final    Comment: (NOTE) The GeneXpert MRSA Assay (FDA approved for NASAL specimens only), is one component of a comprehensive MRSA colonization surveillance program. It is not intended to diagnose MRSA infection nor to guide or monitor treatment for MRSA infections. Test performance is not FDA approved in patients less than 61 years old. Performed at [facility], [address], [place],  [address]      Time coordinating discharge: Over 30 minutes  SIGNED:   Llana Deshazo J [place] DO  [facility] 02/10/2021, 11:39 AM

## 2021-02-10 NOTE — Telephone Encounter (Signed)
Spoke with pt's daughter and she stated that pt came home from the hospital today and has been extremely fatigue and weak. She stated that the pt has not been eating good and not getting enough fluids in. She was advised to keep encouraging food and liquids as often as possible. She also stated that the pt had been getting benzonatate in the hospital and it did help. Pt has a prescription of it at home that daughter is going to have her start taking tonight for the cough. Daughter wanted to make sure that since the pt is so fatigued and weak that she continues to take the Remeron.

## 2021-02-10 NOTE — Patient Outreach (Signed)
[facility]) Care Management  02/10/2021  Frances Kent [birth year] [id number]   Telephone outreach to patient to obtain mRS was successfully completed. MRS= [place] Care Management Assistant

## 2021-02-10 NOTE — TOC Transition Note (Addendum)
Transition of Care [facility]) - CM/SW Discharge Note   Patient Details  Name: Frances Kent MRN: [id number] Date of Birth: January 05, 1931  Transition of Care [facility]) CM/SW Contact:  Alberteen Sam, LCSW Phone Number: 02/10/2021, 12:22 PM   Clinical Narrative:     Patient to discharge home with home health today, is set up with Alvis Lemmings for [facility] services for PT, OT, RN, aide and social work, Tommi Rumps with Alvis Lemmings informed of discharge.     Barriers to Discharge: Continued Medical Work up   Patient Goals and CMS Choice Patient states their goals for this hospitalization and ongoing recovery are:: to go home CMS Medicare.gov Compare Post Acute Care list provided to:: Patient Represenative (must comment) (daughter) Choice offered to / list presented to : Adult Children  Discharge Placement                       Discharge Plan and Services                          HH Arranged: PT, OT, RN, Nurse's Aide, Social Work [facility] Agency: [place] Date [place]: 02/09/21 Time [place]: [place] Representative spoke with at [place]: [place] ([place]) Interventions     Readmission Risk Interventions Readmission Risk Prevention Plan 01/14/2021 11/01/2020  Transportation Screening Complete Complete  PCP or Specialist Appt within 5-7 Days - Complete  Medication Review (RN CM) - Complete  Medication Review Press photographer) Complete -  PCP or Specialist appointment within 3-5 days of discharge Complete -  Cave or Home Care Consult Complete -  SW Recovery Care/Counseling Consult Complete -  Palliative Care Screening Not Applicable -  [place] Not Applicable -  Some recent data might be hidden

## 2021-02-10 NOTE — Telephone Encounter (Signed)
Spoke with daughter she verbalized understanding

## 2021-02-11 ENCOUNTER — Telehealth: Payer: Self-pay | Admitting: Oncology

## 2021-02-11 ENCOUNTER — Telehealth: Payer: Self-pay

## 2021-02-11 NOTE — Telephone Encounter (Signed)
Transition Care Management Unsuccessful Follow-up Telephone Call  Date of discharge and from where:  02/10/2021 [facility]  Attempts:  1st Attempt  Reason for unsuccessful TCM follow-up call:  Left voice message

## 2021-02-11 NOTE — Telephone Encounter (Signed)
I called pt, daughter answered, stated there were a few people that were sick and decided they needed to recover a little bit before rescheduling. She will call back at a later time to reschedule the appt.

## 2021-02-11 NOTE — Telephone Encounter (Signed)
Pt just got discharged from hospital. Needs to reschedule her 12-28 appt. Call back at [phone]

## 2021-02-12 ENCOUNTER — Telehealth: Payer: Self-pay | Admitting: Internal Medicine

## 2021-02-12 NOTE — Telephone Encounter (Signed)
Transition Care Management Unsuccessful Follow-up Telephone Call  Date of discharge and from where:  02/11/21 [facility]  Attempts:  2nd Attempt  Reason for unsuccessful TCM follow-up call:  No answer/busy. Will follow.

## 2021-02-12 NOTE — Telephone Encounter (Signed)
Transition Care Management Follow-up Telephone Call Date of discharge and from where: 02/10/21 [facility] How have you been since you were released from the hospital? Stronger today. Denies shortness of breath, N/V/D, pain, swelling in lower extremities, headache, fever. Minimal cough.  Any questions or concerns? No  Items Reviewed: Did the pt receive and understand the discharge instructions provided? Yes  Medications obtained and verified? Yes  Any new allergies since your discharge? No  Dietary orders reviewed? Yes Do you have support at home? Yes   Home Care and Equipment/Supplies: Were home health services ordered? Yes. Awaiting contact.  Home Health: PT/OT/RN/aide/social work  Functional Questionnaire: (I = Independent and D = Dependent) ADLs: daughter assist  Transferring/Ambulation- [place]  Managing Meds- daughter manages  Follow up appointments reviewed:  PCP Hospital f/u appt confirmed? Yes  Scheduled to see PCP on 02/20/20 @ 11:00. [facility] f/u appt confirmed?  Daughter to schedule with Pulmonary, Cardiology, Vascular Are transportation arrangements needed? No  If their condition worsens, is the pt aware to call PCP or go to the Emergency Dept.? Yes Was the patient provided with contact information for the PCP's office or ED? Yes Was to pt encouraged to call back with questions or concerns? Yes

## 2021-02-12 NOTE — Telephone Encounter (Signed)
Spoke with [employer] and gave the verbal okay to switch the manufacturer.

## 2021-02-12 NOTE — Telephone Encounter (Signed)
Patient went to the ED yesterday and the doctor sent a refill of her levothyroxine (EUTHYROX) 50 MCG tablet to her pharmacy. Patient's pharmacy needs a ok to fill by Dr Derrel Nip, because the packaging of the medication has changed.

## 2021-02-14 ENCOUNTER — Encounter: Payer: Self-pay | Admitting: Intensive Care

## 2021-02-14 ENCOUNTER — Emergency Department: Payer: Medicare HMO

## 2021-02-14 ENCOUNTER — Other Ambulatory Visit: Payer: Self-pay

## 2021-02-14 DIAGNOSIS — I959 Hypotension, unspecified: Secondary | ICD-10-CM | POA: Diagnosis not present

## 2021-02-14 DIAGNOSIS — F419 Anxiety disorder, unspecified: Secondary | ICD-10-CM | POA: Diagnosis present

## 2021-02-14 DIAGNOSIS — Z86711 Personal history of pulmonary embolism: Secondary | ICD-10-CM

## 2021-02-14 DIAGNOSIS — Z823 Family history of stroke: Secondary | ICD-10-CM | POA: Diagnosis not present

## 2021-02-14 DIAGNOSIS — I480 Paroxysmal atrial fibrillation: Secondary | ICD-10-CM | POA: Diagnosis not present

## 2021-02-14 DIAGNOSIS — I6523 Occlusion and stenosis of bilateral carotid arteries: Secondary | ICD-10-CM | POA: Diagnosis present

## 2021-02-14 DIAGNOSIS — E785 Hyperlipidemia, unspecified: Secondary | ICD-10-CM | POA: Diagnosis present

## 2021-02-14 DIAGNOSIS — Y95 Nosocomial condition: Secondary | ICD-10-CM | POA: Diagnosis present

## 2021-02-14 DIAGNOSIS — K219 Gastro-esophageal reflux disease without esophagitis: Secondary | ICD-10-CM | POA: Diagnosis not present

## 2021-02-14 DIAGNOSIS — Z888 Allergy status to other drugs, medicaments and biological substances status: Secondary | ICD-10-CM

## 2021-02-14 DIAGNOSIS — J90 Pleural effusion, not elsewhere classified: Secondary | ICD-10-CM | POA: Diagnosis not present

## 2021-02-14 DIAGNOSIS — R9389 Abnormal findings on diagnostic imaging of other specified body structures: Secondary | ICD-10-CM | POA: Diagnosis not present

## 2021-02-14 DIAGNOSIS — J849 Interstitial pulmonary disease, unspecified: Secondary | ICD-10-CM | POA: Diagnosis present

## 2021-02-14 DIAGNOSIS — I081 Rheumatic disorders of both mitral and tricuspid valves: Secondary | ICD-10-CM | POA: Diagnosis present

## 2021-02-14 DIAGNOSIS — I5022 Chronic systolic (congestive) heart failure: Secondary | ICD-10-CM | POA: Diagnosis not present

## 2021-02-14 DIAGNOSIS — J9811 Atelectasis: Secondary | ICD-10-CM | POA: Diagnosis not present

## 2021-02-14 DIAGNOSIS — I509 Heart failure, unspecified: Secondary | ICD-10-CM | POA: Diagnosis not present

## 2021-02-14 DIAGNOSIS — D649 Anemia, unspecified: Secondary | ICD-10-CM | POA: Diagnosis not present

## 2021-02-14 DIAGNOSIS — Z803 Family history of malignant neoplasm of breast: Secondary | ICD-10-CM

## 2021-02-14 DIAGNOSIS — Z20822 Contact with and (suspected) exposure to covid-19: Secondary | ICD-10-CM | POA: Diagnosis present

## 2021-02-14 DIAGNOSIS — Z8249 Family history of ischemic heart disease and other diseases of the circulatory system: Secondary | ICD-10-CM

## 2021-02-14 DIAGNOSIS — I452 Bifascicular block: Secondary | ICD-10-CM | POA: Diagnosis present

## 2021-02-14 DIAGNOSIS — H3530 Unspecified macular degeneration: Secondary | ICD-10-CM | POA: Diagnosis present

## 2021-02-14 DIAGNOSIS — R0689 Other abnormalities of breathing: Secondary | ICD-10-CM | POA: Diagnosis not present

## 2021-02-14 DIAGNOSIS — I110 Hypertensive heart disease with heart failure: Secondary | ICD-10-CM | POA: Diagnosis not present

## 2021-02-14 DIAGNOSIS — Z66 Do not resuscitate: Secondary | ICD-10-CM | POA: Diagnosis present

## 2021-02-14 DIAGNOSIS — R0600 Dyspnea, unspecified: Secondary | ICD-10-CM | POA: Diagnosis not present

## 2021-02-14 DIAGNOSIS — R0902 Hypoxemia: Secondary | ICD-10-CM | POA: Diagnosis not present

## 2021-02-14 DIAGNOSIS — R069 Unspecified abnormalities of breathing: Secondary | ICD-10-CM | POA: Diagnosis not present

## 2021-02-14 DIAGNOSIS — Z7901 Long term (current) use of anticoagulants: Secondary | ICD-10-CM | POA: Diagnosis not present

## 2021-02-14 DIAGNOSIS — Z800 Family history of malignant neoplasm of digestive organs: Secondary | ICD-10-CM

## 2021-02-14 DIAGNOSIS — E039 Hypothyroidism, unspecified: Secondary | ICD-10-CM | POA: Diagnosis present

## 2021-02-14 DIAGNOSIS — I482 Chronic atrial fibrillation, unspecified: Secondary | ICD-10-CM | POA: Diagnosis not present

## 2021-02-14 DIAGNOSIS — I440 Atrioventricular block, first degree: Secondary | ICD-10-CM | POA: Diagnosis not present

## 2021-02-14 DIAGNOSIS — R0602 Shortness of breath: Secondary | ICD-10-CM | POA: Diagnosis not present

## 2021-02-14 DIAGNOSIS — Z8673 Personal history of transient ischemic attack (TIA), and cerebral infarction without residual deficits: Secondary | ICD-10-CM | POA: Diagnosis not present

## 2021-02-14 DIAGNOSIS — R54 Age-related physical debility: Secondary | ICD-10-CM | POA: Diagnosis present

## 2021-02-14 DIAGNOSIS — I517 Cardiomegaly: Secondary | ICD-10-CM | POA: Diagnosis not present

## 2021-02-14 DIAGNOSIS — I10 Essential (primary) hypertension: Secondary | ICD-10-CM | POA: Diagnosis not present

## 2021-02-14 DIAGNOSIS — Z79890 Hormone replacement therapy (postmenopausal): Secondary | ICD-10-CM

## 2021-02-14 DIAGNOSIS — K449 Diaphragmatic hernia without obstruction or gangrene: Secondary | ICD-10-CM | POA: Diagnosis present

## 2021-02-14 DIAGNOSIS — R0603 Acute respiratory distress: Secondary | ICD-10-CM | POA: Diagnosis not present

## 2021-02-14 DIAGNOSIS — Z79899 Other long term (current) drug therapy: Secondary | ICD-10-CM

## 2021-02-14 DIAGNOSIS — J189 Pneumonia, unspecified organism: Secondary | ICD-10-CM | POA: Diagnosis not present

## 2021-02-14 LAB — COMPREHENSIVE METABOLIC PANEL
ALT: 25 U/L (ref 0–44)
AST: 27 U/L (ref 15–41)
Albumin: 2.9 g/dL — ABNORMAL LOW (ref 3.5–5.0)
Alkaline Phosphatase: 100 U/L (ref 38–126)
Anion gap: 5 (ref 5–15)
BUN: 16 mg/dL (ref 8–23)
CO2: 25 mmol/L (ref 22–32)
Calcium: 8.9 mg/dL (ref 8.9–10.3)
Chloride: 108 mmol/L (ref 98–111)
Creatinine, Ser: 0.95 mg/dL (ref 0.44–1.00)
GFR, Estimated: 58 mL/min — ABNORMAL LOW (ref >60)
Glucose, Bld: 96 mg/dL (ref 70–99)
Potassium: 4.2 mmol/L (ref 3.5–5.1)
Sodium: 138 mmol/L (ref 135–145)
Total Bilirubin: 0.6 mg/dL (ref 0.3–1.2)
Total Protein: 6.5 g/dL (ref 6.5–8.1)

## 2021-02-14 LAB — CBC WITH DIFFERENTIAL/PLATELET
Abs Immature Granulocytes: 0.1 10*3/uL — ABNORMAL HIGH (ref 0.00–0.07)
Basophils Absolute: 0.2 10*3/uL — ABNORMAL HIGH (ref 0.0–0.1)
Basophils Relative: 2 %
Eosinophils Absolute: 0.1 10*3/uL (ref 0.0–0.5)
Eosinophils Relative: 1 %
HCT: 33.5 % — ABNORMAL LOW (ref 36.0–46.0)
Hemoglobin: 10.2 g/dL — ABNORMAL LOW (ref 12.0–15.0)
Immature Granulocytes: 1 %
Lymphocytes Relative: 21 %
Lymphs Abs: 1.8 10*3/uL (ref 0.7–4.0)
MCH: 26.5 pg (ref 26.0–34.0)
MCHC: 30.4 g/dL (ref 30.0–36.0)
MCV: 87 fL (ref 80.0–100.0)
Monocytes Absolute: 0.8 10*3/uL (ref 0.1–1.0)
Monocytes Relative: 10 %
Neutro Abs: 5.5 10*3/uL (ref 1.7–7.7)
Neutrophils Relative %: 65 %
Platelets: 492 10*3/uL — ABNORMAL HIGH (ref 150–400)
RBC: 3.85 MIL/uL — ABNORMAL LOW (ref 3.87–5.11)
RDW: 22.9 % — ABNORMAL HIGH (ref 11.5–15.5)
WBC: 8.4 10*3/uL (ref 4.0–10.5)
nRBC: 0 % (ref 0.0–0.2)

## 2021-02-14 LAB — RESP PANEL BY RT-PCR (FLU A&B, COVID) ARPGX2
Influenza A by PCR: NEGATIVE
Influenza B by PCR: NEGATIVE
SARS Coronavirus 2 by RT PCR: NEGATIVE

## 2021-02-14 MED ORDER — RIVAROXABAN 15 MG PO TABS
15.0000 mg | ORAL_TABLET | Freq: Once | ORAL | Status: AC
  Administered 2021-02-14: 15 mg via ORAL
  Filled 2021-02-14: qty 1

## 2021-02-14 MED ORDER — AMIODARONE HCL 200 MG PO TABS
100.0000 mg | ORAL_TABLET | Freq: Once | ORAL | Status: AC
  Administered 2021-02-14: 100 mg via ORAL
  Filled 2021-02-14: qty 1

## 2021-02-14 NOTE — ED Triage Notes (Signed)
Patient presents with SOB that started this afternoon. Labored breathing noted. Was recently discharged from hospital after getting admitted for pneumonia. A&O x4. Denies pain. Hx heart failure, a-fib

## 2021-02-14 NOTE — ED Notes (Signed)
Per dr. Cinda Quest, ok to order home meds of amdiodarone and xarelto

## 2021-02-15 ENCOUNTER — Emergency Department: Payer: Medicare HMO

## 2021-02-15 ENCOUNTER — Inpatient Hospital Stay
Admission: EM | Admit: 2021-02-15 | Discharge: 2021-02-20 | DRG: 392 | Disposition: A | Discharge status: Home Health | Payer: Medicare HMO | Attending: Hospitalist | Admitting: Hospitalist

## 2021-02-15 DIAGNOSIS — E039 Hypothyroidism, unspecified: Secondary | ICD-10-CM | POA: Diagnosis not present

## 2021-02-15 DIAGNOSIS — J189 Pneumonia, unspecified organism: Secondary | ICD-10-CM

## 2021-02-15 DIAGNOSIS — I482 Chronic atrial fibrillation, unspecified: Secondary | ICD-10-CM | POA: Diagnosis not present

## 2021-02-15 DIAGNOSIS — R0902 Hypoxemia: Secondary | ICD-10-CM

## 2021-02-15 DIAGNOSIS — R9389 Abnormal findings on diagnostic imaging of other specified body structures: Secondary | ICD-10-CM

## 2021-02-15 DIAGNOSIS — J90 Pleural effusion, not elsewhere classified: Secondary | ICD-10-CM | POA: Diagnosis not present

## 2021-02-15 DIAGNOSIS — I509 Heart failure, unspecified: Secondary | ICD-10-CM

## 2021-02-15 DIAGNOSIS — I517 Cardiomegaly: Secondary | ICD-10-CM | POA: Diagnosis not present

## 2021-02-15 DIAGNOSIS — R0602 Shortness of breath: Secondary | ICD-10-CM | POA: Diagnosis not present

## 2021-02-15 DIAGNOSIS — R0600 Dyspnea, unspecified: Secondary | ICD-10-CM

## 2021-02-15 HISTORY — DX: Pneumonia, unspecified organism: J18.9

## 2021-02-15 LAB — TROPONIN I (HIGH SENSITIVITY)
Troponin I (High Sensitivity): 34 ng/L — ABNORMAL HIGH (ref <18)
Troponin I (High Sensitivity): 36 ng/L — ABNORMAL HIGH (ref <18)

## 2021-02-15 LAB — CBC
HCT: 31.7 % — ABNORMAL LOW (ref 36.0–46.0)
Hemoglobin: 9.6 g/dL — ABNORMAL LOW (ref 12.0–15.0)
MCH: 26 pg (ref 26.0–34.0)
MCHC: 30.3 g/dL (ref 30.0–36.0)
MCV: 85.9 fL (ref 80.0–100.0)
Platelets: 447 10*3/uL — ABNORMAL HIGH (ref 150–400)
RBC: 3.69 MIL/uL — ABNORMAL LOW (ref 3.87–5.11)
RDW: 23.3 % — ABNORMAL HIGH (ref 11.5–15.5)
WBC: 8.6 10*3/uL (ref 4.0–10.5)
nRBC: 0 % (ref 0.0–0.2)

## 2021-02-15 LAB — BASIC METABOLIC PANEL
Anion gap: 6 (ref 5–15)
BUN: 13 mg/dL (ref 8–23)
CO2: 24 mmol/L (ref 22–32)
Calcium: 8.7 mg/dL — ABNORMAL LOW (ref 8.9–10.3)
Chloride: 109 mmol/L (ref 98–111)
Creatinine, Ser: 0.99 mg/dL (ref 0.44–1.00)
GFR, Estimated: 55 mL/min — ABNORMAL LOW (ref >60)
Glucose, Bld: 90 mg/dL (ref 70–99)
Potassium: 4.1 mmol/L (ref 3.5–5.1)
Sodium: 139 mmol/L (ref 135–145)

## 2021-02-15 LAB — STREP PNEUMONIAE URINARY ANTIGEN: Strep Pneumo Urinary Antigen: NEGATIVE

## 2021-02-15 LAB — MRSA NEXT GEN BY PCR, NASAL: MRSA by PCR Next Gen: NOT DETECTED

## 2021-02-15 LAB — LACTIC ACID, PLASMA: Lactic Acid, Venous: 1.2 mmol/L (ref 0.5–1.9)

## 2021-02-15 LAB — BRAIN NATRIURETIC PEPTIDE: B Natriuretic Peptide: 483.2 pg/mL — ABNORMAL HIGH (ref 0.0–100.0)

## 2021-02-15 LAB — PROCALCITONIN: Procalcitonin: <0.1 ng/mL

## 2021-02-15 MED ORDER — MAGNESIUM HYDROXIDE 400 MG/5ML PO SUSP: 30.0000 mL | Freq: Every day | ORAL | Status: DC | PRN

## 2021-02-15 MED ORDER — SODIUM CHLORIDE 0.9 % IV SOLN: 2.0000 g | INTRAVENOUS | Status: DC

## 2021-02-15 MED ORDER — SENNOSIDES-DOCUSATE SODIUM 8.6-50 MG PO TABS
1.0000 | ORAL_TABLET | Freq: Two times a day (BID) | ORAL | Status: DC | PRN
  Administered 2021-02-17: 1 via ORAL
  Filled 2021-02-15: qty 1

## 2021-02-15 MED ORDER — FUROSEMIDE 40 MG PO TABS
40.0000 mg | ORAL_TABLET | Freq: Every day | ORAL | Status: DC
  Administered 2021-02-15: 40 mg via ORAL
  Filled 2021-02-15 (×2): qty 1

## 2021-02-15 MED ORDER — DOXYCYCLINE HYCLATE 100 MG PO TABS: 100.0000 mg | ORAL_TABLET | Freq: Once | ORAL | Status: DC

## 2021-02-15 MED ORDER — IPRATROPIUM-ALBUTEROL 0.5-2.5 (3) MG/3ML IN SOLN: 3.0000 mL | RESPIRATORY_TRACT | Status: DC | PRN

## 2021-02-15 MED ORDER — ADULT MULTIVITAMIN W/MINERALS CH
1.0000 | ORAL_TABLET | Freq: Every day | ORAL | Status: DC
  Administered 2021-02-15 – 2021-02-19 (×5): 1 via ORAL
  Filled 2021-02-15 (×5): qty 1

## 2021-02-15 MED ORDER — VANCOMYCIN HCL 500 MG/100ML IV SOLN: 500.0000 mg | INTRAVENOUS | Status: DC

## 2021-02-15 MED ORDER — LEVOTHYROXINE SODIUM 50 MCG PO TABS
50.0000 ug | ORAL_TABLET | Freq: Every day | ORAL | Status: DC
  Administered 2021-02-15 – 2021-02-20 (×6): 50 ug via ORAL
  Filled 2021-02-15 (×6): qty 1

## 2021-02-15 MED ORDER — GUAIFENESIN ER 600 MG PO TB12
600.0000 mg | ORAL_TABLET | Freq: Two times a day (BID) | ORAL | Status: DC
  Administered 2021-02-15 – 2021-02-19 (×9): 600 mg via ORAL
  Filled 2021-02-15 (×11): qty 1

## 2021-02-15 MED ORDER — METHYLPREDNISOLONE SODIUM SUCC 40 MG IJ SOLR: 40.0000 mg | Freq: Two times a day (BID) | INTRAMUSCULAR | Status: DC

## 2021-02-15 MED ORDER — AMIODARONE HCL 200 MG PO TABS
100.0000 mg | ORAL_TABLET | Freq: Two times a day (BID) | ORAL | Status: DC
  Administered 2021-02-15 – 2021-02-20 (×11): 100 mg via ORAL
  Filled 2021-02-15 (×11): qty 1

## 2021-02-15 MED ORDER — IOHEXOL 350 MG/ML SOLN
75.0000 mL | Freq: Once | INTRAVENOUS | Status: AC | PRN
  Administered 2021-02-15: 75 mL via INTRAVENOUS

## 2021-02-15 MED ORDER — SODIUM CHLORIDE 0.9 % IV SOLN
1.0000 g | INTRAVENOUS | Status: AC
  Administered 2021-02-15: 1 g via INTRAVENOUS
  Filled 2021-02-15: qty 1

## 2021-02-15 MED ORDER — ACETAMINOPHEN 650 MG RE SUPP: 650.0000 mg | Freq: Four times a day (QID) | RECTAL | Status: DC | PRN

## 2021-02-15 MED ORDER — SODIUM CHLORIDE 0.9 % IV SOLN
100.0000 mg | Freq: Two times a day (BID) | INTRAVENOUS | Status: DC
  Administered 2021-02-15: 100 mg via INTRAVENOUS
  Filled 2021-02-15 (×2): qty 100

## 2021-02-15 MED ORDER — SODIUM CHLORIDE 0.9 % IV SOLN
1.0000 g | Freq: Once | INTRAVENOUS | Status: AC
  Administered 2021-02-15: 1 g via INTRAVENOUS
  Filled 2021-02-15: qty 1

## 2021-02-15 MED ORDER — ENOXAPARIN SODIUM 40 MG/0.4ML IJ SOSY: 40.0000 mg | PREFILLED_SYRINGE | INTRAMUSCULAR | Status: DC

## 2021-02-15 MED ORDER — RIVAROXABAN 15 MG PO TABS
15.0000 mg | ORAL_TABLET | Freq: Every day | ORAL | Status: DC
  Administered 2021-02-15 – 2021-02-19 (×5): 15 mg via ORAL
  Filled 2021-02-15 (×6): qty 1

## 2021-02-15 MED ORDER — FERROUS SULFATE 325 (65 FE) MG PO TABS
325.0000 mg | ORAL_TABLET | Freq: Two times a day (BID) | ORAL | Status: DC
  Administered 2021-02-15 – 2021-02-20 (×11): 325 mg via ORAL
  Filled 2021-02-15 (×11): qty 1

## 2021-02-15 MED ORDER — TRAZODONE HCL 50 MG PO TABS
25.0000 mg | ORAL_TABLET | Freq: Every evening | ORAL | Status: DC | PRN
  Administered 2021-02-15 – 2021-02-19 (×5): 25 mg via ORAL
  Filled 2021-02-15 (×5): qty 1

## 2021-02-15 MED ORDER — VANCOMYCIN HCL 750 MG/150ML IV SOLN
750.0000 mg | Freq: Once | INTRAVENOUS | Status: AC
  Administered 2021-02-15: 750 mg via INTRAVENOUS
  Filled 2021-02-15: qty 150

## 2021-02-15 MED ORDER — ACETAMINOPHEN 325 MG PO TABS
650.0000 mg | ORAL_TABLET | Freq: Four times a day (QID) | ORAL | Status: DC | PRN
  Administered 2021-02-15 – 2021-02-20 (×5): 650 mg via ORAL
  Filled 2021-02-15 (×6): qty 2

## 2021-02-15 MED ORDER — ALPRAZOLAM 0.25 MG PO TABS
0.2500 mg | ORAL_TABLET | Freq: Every evening | ORAL | Status: DC | PRN
  Administered 2021-02-15 – 2021-02-17 (×2): 0.25 mg via ORAL
  Filled 2021-02-15 (×2): qty 1

## 2021-02-15 MED ORDER — IPRATROPIUM-ALBUTEROL 0.5-2.5 (3) MG/3ML IN SOLN
3.0000 mL | Freq: Four times a day (QID) | RESPIRATORY_TRACT | Status: DC
  Administered 2021-02-15 – 2021-02-16 (×4): 3 mL via RESPIRATORY_TRACT
  Filled 2021-02-15 (×6): qty 3

## 2021-02-15 NOTE — ED Notes (Signed)
RN at bedside to introduce self to pt. Pt anx still infusing at this time. Pt bed raised for pt comfort. Pt denies any further needs at this time.

## 2021-02-15 NOTE — Progress Notes (Signed)
PT Cancellation Note  Patient Details Name: Frances Kent MRN: [id number] DOB: [birth year]   Cancelled Treatment:    Reason Eval/Treat Not Completed: Medical issues which prohibited therapy (Consult received and chart reviewed. Per orders, patient pending VQ scan to rule out PE.  Will hold PT eval until test complete, results received and patient cleared for exertional activity.)   Delanie Tirrell H. Owens Shark, PT, DPT, NCS 02/15/21, 9:27 AM [phone]

## 2021-02-15 NOTE — Progress Notes (Signed)
OT Cancellation Note  Patient Details Name: Frances Kent MRN: [id number] DOB: May 31, 1930   Cancelled Treatment:    Reason Eval/Treat Not Completed: Patient not medically ready. OT order received. Per chart review, pt still pending VQ scan to rule out PE. OT to hold evaluation until test complete, results received, and patient cleared for exertional activity.   Fredirick Maudlin, OTR/L [place]

## 2021-02-15 NOTE — Progress Notes (Deleted)
PHARMACIST - PHYSICIAN COMMUNICATION  CONCERNING:  Enoxaparin (Lovenox) for DVT Prophylaxis    RECOMMENDATION: Patient was prescribed enoxaprin 40mg  q24 hours for VTE prophylaxis.   Filed Weights   02/14/21 1824  Weight: 45.4 kg (100 lb)    Body mass index is 17.71 kg/m.  Estimated Creatinine Clearance: 29.3 mL/min (by C-G formula based on SCr of 0.95 mg/dL).   Patient is candidate for enoxaparin 30mg  every 24 hours based on CrCl <78ml/min or Weight <45kg  DESCRIPTION: Pharmacy has adjusted enoxaparin dose per [facility] policy.  Patient is now receiving enoxaparin 30 mg every 24 hours   Renda Rolls, PharmD, [facility] 02/15/2021 4:27 AM

## 2021-02-15 NOTE — ED Notes (Signed)
Ambulated pt in room. Pt maintained oxygen saturation of 97% during and after ambulation. However, pts respiratory rate increased from 25 to 32 breaths per minute and pt was not able to tolerate standing for more than 5 minutes.

## 2021-02-15 NOTE — H&P (Signed)
Frances Kent   PATIENT NAME: Frances Kent    MR#:  [id number]  DATE OF BIRTH:  24-Dec-1930  DATE OF ADMISSION:  02/15/2021  PRIMARY CARE PHYSICIAN: [name], MD  .  Neck Patient is coming from: Home.  REQUESTING/REFERRING PHYSICIAN: Rudene Re, MD  CHIEF COMPLAINT:   Chief Complaint  Patient presents with   Shortness of Breath    HISTORY OF PRESENT ILLNESS:  Frances Kent is a 88 y.o. Caucasian female with medical history significant for  hypertension, hyperlipidemia, stroke, GERD, hypothyroidism, depression with anxiety, interstitial lung disease, anemia, SSS, SVT, PE and atrial fibrillation on Xarelto, mitral valve prolapse, CHF with EF of 40-45%, bilateral carotid artery stenosis, who was recently admitted for atypical pneumonia on 12/20 and discharged on 12/28.  She has been doing fairly well until yesterday when she started having worsening dyspnea with associated tachypnea.  She was given p.o. Xanax that initially improved that but later recurred.  She admits to associated mild nonproductive cough with occasional wheezing.  No chest pain or palpitations.  No dysuria, oliguria or hematuria or flank pain.  No nausea or vomiting or abdominal pain.  ED Course: Upon presentation to the ER, BP was 130/56 with a heart rate of 52 and respiratory 28.  Labs revealed unremarkable CMP except for albumin of 2.9.  BNP was 483.2 and high-sensitivity troponin I 34 and later 36.  Lactic acid was 1.2 and procalcitonin less than 0.1.  CBC showed anemia close to previous levels.  Influenza antigens and COVID-19 PCR came back negative.  EKG as reviewed by me : Showed right bundle branch block and left posterior fascicular block (bifascicular block) and Q waves inferiorly Imaging: Chest x-ray showed mild bibasilar subsegmental atelectasis with minimal pleural effusions.  Chest CTA revealed tiny focus of low attenuation along lower left lobe branch of the left pulmonary artery that  may represent a small artifact with recommendation to correlate with VQ scan or repeat CTA.  It showed moderate severity posterolateral right lower lobe infiltrate and very small left pleural effusion, mild cardiomegaly, mild severity areas of scarring and/or atelectasis in both APCs and mild posterior left basal atelectasis and aortic atherosclerosis.  The patient was given IV cefepime and p.o. doxycycline as well as p.o. amiodarone and Xarelto.  She will be admitted to a cardiac telemetry observation bed for further evaluation and management. PAST MEDICAL HISTORY:   Past Medical History:  Diagnosis Date   Atrial fibrillation ([place])    Benign breast cyst in female, left 10/07/2016   Colon adenomas    GERD (gastroesophageal reflux disease)    Hypertension    Hypothyroidism    Macular degeneration    Mitral regurgitation    Pulmonary embolism ([place]) 02/2016   SVT (supraventricular tachycardia) ([place])     PAST SURGICAL HISTORY:   Past Surgical History:  Procedure Laterality Date   APPENDECTOMY  1960   BREAST CYST ASPIRATION Left 2017   CARDIOVERSION N/A 04/05/2016   Procedure: Cardioversion;  Surgeon: [name], MD;  Location: [facility] ORS;  Service: Cardiovascular;  Laterality: N/A;   CHOLECYSTECTOMY  1985   ESOPHAGOGASTRODUODENOSCOPY (EGD) WITH PROPOFOL N/A 11/27/2019   Procedure: ESOPHAGOGASTRODUODENOSCOPY (EGD) WITH PROPOFOL;  Surgeon: Lesly Rubenstein, MD;  Location: [facility] ENDOSCOPY;  Service: Endoscopy;  Laterality: N/A;   ESOPHAGOGASTRODUODENOSCOPY (EGD) WITH PROPOFOL N/A 12/24/2019   Procedure: ESOPHAGOGASTRODUODENOSCOPY (EGD) WITH PROPOFOL;  Surgeon: Lesly Rubenstein, MD;  Location: [facility] ENDOSCOPY;  Service: Endoscopy;  Laterality: N/A;  IR CT HEAD LTD  11/01/2020   IR PERCUTANEOUS ART THROMBECTOMY/INFUSION INTRACRANIAL INC DIAG ANGIO  11/01/2020   IR US GUIDE VASC ACCESS RIGHT  11/02/2020   OVARIAN CYST REMOVAL     RADIOLOGY WITH ANESTHESIA N/A 11/01/2020   Procedure:  RADIOLOGY WITH ANESTHESIA;  Surgeon: Luanne Bras, MD;  Location: [place];  Service: Radiology;  Laterality: N/A;   TEE WITHOUT CARDIOVERSION N/A 02/01/2018   Procedure: TRANSESOPHAGEAL ECHOCARDIOGRAM (TEE);  Surgeon: [name], MD;  Location: [facility] ORS;  Service: Cardiovascular;  Laterality: N/A;    SOCIAL HISTORY:   Social History   Tobacco Use   Smoking status: Never   Smokeless tobacco: Never   Tobacco comments:    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 , worked at [employer], [employer]  Substance Use Topics   Alcohol use: No    FAMILY HISTORY:   Family History  Problem Relation Age of Onset   Cancer Mother 41       breast cancer, lived to 39,    Breast cancer Mother 75   Cancer Son 57       pancreatic cancer   Heart disease Son        CAD, Tobacco Abuse    Heart disease Maternal Grandmother 80       died of massive MI   Stroke Sister     DRUG ALLERGIES:   Allergies  Allergen Reactions   Statins     Severe myalgias    REVIEW OF SYSTEMS:   ROS As per history of present illness. All pertinent systems were reviewed above. Constitutional, HEENT, cardiovascular, respiratory, GI, GU, musculoskeletal, neuro, psychiatric, endocrine, integumentary and hematologic systems were reviewed and are otherwise negative/unremarkable except for positive findings mentioned above in the HPI.   MEDICATIONS AT HOME:   Prior to Admission medications   Medication Sig Start Date End Date Taking? Authorizing Provider  acetaminophen (TYLENOL) 500 MG tablet Take 500 mg by mouth every 6 (six) hours as needed.    [provider]  ALPRAZolam Duanne Moron) 0.25 MG tablet Take 1 tablet (0.25 mg total) by mouth at bedtime as needed for anxiety. 12/07/20   [name], MD  amiodarone (PACERONE) 200 MG tablet Take 1/2 tablet (100 mg total) by mouth daily. Patient taking differently: Take 100 mg by mouth 2 (two) times daily. 11/13/20   Love, Ivan Anchors, PA-C  ferrous sulfate 325 (65 FE) MG EC tablet  Take 1 tablet (325 mg total) by mouth 2 (two) times daily. 01/15/21 07/14/21  Mercy Riding, MD  furosemide (LASIX) 20 MG tablet Take 2 tablets (40 mg total) by mouth daily. 01/15/21 04/15/21  Mercy Riding, MD  levothyroxine (EUTHYROX) 50 MCG tablet Take 1 tablet (50 mcg total) by mouth daily at 6 (six) AM. 02/10/21 05/11/21  [place] Donnamarie Poag, DO  mirtazapine (REMERON) 7.5 MG tablet Take 1 tablet (7.5 mg total) by mouth at bedtime. 01/25/21   [name], MD  Multiple Vitamin (MULTIVITAMIN WITH MINERALS) TABS tablet Take 1 tablet by mouth daily. 11/13/20   Love, Ivan Anchors, PA-C  Rivaroxaban (XARELTO) 15 MG TABS tablet Take 1 tablet (15 mg total) by mouth daily with supper. 11/13/20   Love, Ivan Anchors, PA-C  senna-docusate (SENOKOT-S) 8.6-50 MG tablet Take 1 tablet by mouth 2 (two) times daily between meals as needed for mild constipation. 01/15/21   Mercy Riding, MD      VITAL SIGNS:  Blood pressure (!) 147/77, pulse 84, temperature 98.3 F (36.8 C), temperature  source Oral, resp. rate (!) 26, height 5\' 3"  (1.6 m), weight 45.4 kg, SpO2 97 %.  PHYSICAL EXAMINATION:  Physical Exam  GENERAL:  88 y.o.-year-old Caucasian female patient lying in the bed with no acute distress.  EYES: Pupils equal, round, reactive to light and accommodation. No scleral icterus. Extraocular muscles intact.  HEENT: Head atraumatic, normocephalic. Oropharynx and nasopharynx clear.  NECK:  Supple, no jugular venous distention. No thyroid enlargement, no tenderness.  LUNGS: Diminished bibasilar breath sounds with right more than left basal crackles.  No use of accessory muscles of respiration.  CARDIOVASCULAR: Regular rate and rhythm, S1, S2 normal. No murmurs, rubs, or gallops.  ABDOMEN: Soft, nondistended, nontender. Bowel sounds present. No organomegaly or mass.  EXTREMITIES: No pedal edema, cyanosis, or clubbing.  NEUROLOGIC: Cranial nerves II through XII are intact. Muscle strength 5/5 in all extremities. Sensation  intact. Gait not checked.  PSYCHIATRIC: The patient is alert and oriented x 3.  Normal affect and good eye contact. SKIN: No obvious rash, lesion, or ulcer.   LABORATORY PANEL:   CBC Recent Labs  Lab 02/14/21 1832  WBC 8.4  HGB 10.2*  HCT 33.5*  PLT 492*   ------------------------------------------------------------------------------------------------------------------  Chemistries  Recent Labs  Lab 02/08/21 0802 02/09/21 0533 02/14/21 1832  NA 136   < > 138  K 4.6   < > 4.2  CL 108   < > 108  CO2 25   < > 25  GLUCOSE 90   < > 96  BUN 27*   < > 16  CREATININE 1.04*   < > 0.95  CALCIUM 8.7*   < > 8.9  MG 2.2  --   --   AST  --   --  27  ALT  --   --  25  ALKPHOS  --   --  100  BILITOT  --   --  0.6   < > = values in this interval not displayed.   ------------------------------------------------------------------------------------------------------------------  Cardiac Enzymes No results for input(s): TROPONINI in the last 168 hours. ------------------------------------------------------------------------------------------------------------------  RADIOLOGY:  DG Chest 2 View  Result Date: 02/14/2021 CLINICAL DATA:  Shortness of breath. EXAM: CHEST - 2 VIEW COMPARISON:  February 08, 2021. FINDINGS: Stable cardiomediastinal silhouette. Mild bibasilar subsegmental atelectasis is noted. Minimal pleural effusions may be present. Bony thorax is unremarkable. IMPRESSION: Mild bibasilar subsegmental atelectasis with minimal pleural effusions. Electronically Signed   By: Marijo Conception M.D.   On: 02/14/2021 19:09   CT Angio Chest PE W and/or Wo Contrast  Result Date: 02/15/2021 CLINICAL DATA:  Shortness of breath and labored breathing. EXAM: CT ANGIOGRAPHY CHEST WITH CONTRAST TECHNIQUE: Multidetector CT imaging of the chest was performed using the standard protocol during bolus administration of intravenous contrast. Multiplanar CT image reconstructions and MIPs were obtained  to evaluate the vascular anatomy. CONTRAST:  36mL OMNIPAQUE IOHEXOL 350 MG/ML SOLN COMPARISON:  October 05, 2020 FINDINGS: Cardiovascular: There is mild calcification of the aortic arch, without evidence of aortic aneurysm. A tiny (2.8 mm) focus of intraluminal low attenuation is seen involving and anterior lower lobe branch of the left pulmonary artery (axial CT image 169, CT series 5). There is mild cardiomegaly. No pericardial effusion. Mediastinum/Nodes: Mild pretracheal lymphadenopathy is noted. Thyroid gland, trachea, and esophagus demonstrate no significant findings. Lungs/Pleura: Moderate severity areas of scarring and/or atelectasis are seen within the bilateral apices with mild posterior left basilar atelectasis. Moderate severity infiltrate is seen within the posterolateral aspect of the right lower  lobe. A very small left pleural effusion is present. No pneumothorax is identified. Upper Abdomen: Multiple surgical clips are seen within the gallbladder fossa. Musculoskeletal: No chest wall abnormality. No acute or significant osseous findings. Review of the MIP images confirms the above findings. IMPRESSION: 1. Tiny focus of low attenuation along a lower lobe branch of the left pulmonary artery which may represent a small amount of artifact. Correlation with follow-up chest CTA or nuclear medicine ventilation/perfusion scan is recommended if pulmonary embolism remains of clinical concern. 2. Moderate severity posterolateral right lower lobe infiltrate infiltrate. 3. Very small left pleural effusion. 4. Moderate severity areas of scarring and/or atelectasis are seen within the bilateral apices with mild posterior left basilar atelectasis. 5. Mild cardiomegaly. 6. Aortic atherosclerosis. Aortic Atherosclerosis (ICD10-I70.0). Electronically Signed   By: Virgina Norfolk M.D.   On: 02/15/2021 03:09      IMPRESSION AND PLAN:  Principal Problem:   Pneumonia  1.  Pneumonia, recurrent versus residual  versus worsening recent pneumonia, likely dictated. - The patient will be admitted to a an observation cardiac telemetry bed. - We will continue antibiotic therapy with IV vancomycin and doxycycline. - Mucolytic therapy will be provided. - Bronchodilator with therapy with DuoNebs will be given. - We will follow pneumonia antigens and sputum culture in addition to blood cultures.  2.  Questionable small PE on chest CTA. - We will obtain a VQ scan to rule out PE.  3..  Atrial fibrillation. - We will continue Xarelto and amiodarone.  4.  Hypothyroidism. - We will continue Synthroid and check TSH.  5.  Anxiety. - We will continue Xanax.    DVT prophylaxis: Xarelto. Code Status: Partial code: DNI. Family Communication:  The plan of care was discussed in details with the patient (and family). I answered all questions. The patient agreed to proceed with the above mentioned plan. Further management will depend upon hospital course. Disposition Plan: Back to previous home environment Consults called: none. All the records are reviewed and case discussed with ED provider.  Status is: Observation   I certify that at the time of admission, it is my clinical judgment that the patient will require inpatient hospital care extending less than 2 midnights.                            Dispo: The patient is from: Home              Anticipated d/c is to: Home              Patient currently is not medically stable to d/c.              Difficult to place patient: No      Christel Mormon M.D on 02/15/2021 at 4:56 AM  [facility]   From 7 PM-7 AM, contact night-coverage [email or web address]  CC: Primary care physician; [name], MD

## 2021-02-15 NOTE — ED Notes (Signed)
Patient transported to CT 

## 2021-02-15 NOTE — ED Provider Notes (Signed)
[facility] Provider Note    Event Date/Time   First MD Initiated Contact with Patient 02/15/21 0159     (approximate)   History   Shortness of Breath   HPI  Frances Kent is a 88 y.o. female history of atrial fibrillation on Xarelto, prior pulmonary embolism, systolic CHF who presents for evaluation of shortness of breath.  Patient was admitted to the hospital for 8 days and discharged 5 days ago for multifocal pneumonia.  Patient reports feeling better when she went home.  She was doing well until this afternoon when she started having some difficulty breathing.  She lives with her daughter who felt that it was most likely anxiety and gave her half a milligram of Xanax.  This seems to help her symptoms however couple of hours later patient started complaining again of having difficulty breathing.  Daughter was not sure if she could give her another half of Xanax therefore they called 911.  On arrival patient was reassured that her oxygen and heart rate were normal.  Upon arrival to the emergency room patient reports that her symptoms resolved without any intervention by EMS.  She has been in the waiting room for about 9 hours.  She denies any further episodes of shortness of breath.  She denies any chest pain.  She still has a mild cough but that has been improving significantly.  She has not missed any doses of Xarelto.  She has never had a blood clot since being on Xarelto.  She has no fever.  No leg pain or swelling, no hemoptysis.     Physical Exam   Triage Vital Signs: ED Triage Vitals [02/14/21 1824]  Enc Vitals Group     BP (!) 130/56     Pulse Rate (!) 52     Resp (!) 28     Temp 98 F (36.7 C)     Temp Source Oral     SpO2 98 %     Weight 100 lb (45.4 kg)     Height 5\' 3"  (1.6 m)     Head Circumference      Peak Flow      Pain Score 0     Pain Loc      Pain Edu?      Excl. in [place]?     Most recent vital signs: Vitals:   02/15/21 0300  02/15/21 0330  BP: 123/64 (!) 147/77  Pulse: 79 84  Resp: 17 (!) 26  Temp:    SpO2: 97% 97%     General: Awake, no distress.  CV:  RRR, no murmurs, strong pulses x 4 Resp:  Slightly tachypneic with respiratory rate in the mid 20s, normal sats, otherwise normal work of breathing.  Bilateral crackles but good air movement with no wheezing  abd:  Soft, non tender, non distended, positive bowel sounds, no rebound or guarding MSK:  No edema or cyanosis Neuro:  Normal speech, face is symmetric, moving all extremities with no gross focal neuro deficit   ED Results / Procedures / Treatments   Labs (all labs ordered are listed, but only abnormal results are displayed) Labs Reviewed  CBC WITH DIFFERENTIAL/PLATELET - Abnormal; Notable for the following components:      Result Value   RBC 3.85 (*)    Hemoglobin 10.2 (*)    HCT 33.5 (*)    RDW 22.9 (*)    Platelets 492 (*)    Basophils Absolute 0.2 (*)  Abs Immature Granulocytes 0.10 (*)    All other components within normal limits  COMPREHENSIVE METABOLIC PANEL - Abnormal; Notable for the following components:   Albumin 2.9 (*)    GFR, Estimated 58 (*)    All other components within normal limits  BRAIN NATRIURETIC PEPTIDE - Abnormal; Notable for the following components:   B Natriuretic Peptide 483.2 (*)    All other components within normal limits  TROPONIN I (HIGH SENSITIVITY) - Abnormal; Notable for the following components:   Troponin I (High Sensitivity) 34 (*)    All other components within normal limits  TROPONIN I (HIGH SENSITIVITY) - Abnormal; Notable for the following components:   Troponin I (High Sensitivity) 36 (*)    All other components within normal limits  RESP PANEL BY RT-PCR (FLU A&B, COVID) ARPGX2  CULTURE, BLOOD (SINGLE)  CULTURE, BLOOD (ROUTINE X 2)  CULTURE, BLOOD (ROUTINE X 2)  EXPECTORATED SPUTUM ASSESSMENT W GRAM STAIN, RFLX TO RESP C  MRSA NEXT GEN BY PCR, NASAL  LACTIC ACID, PLASMA  LACTIC ACID,  PLASMA  PROCALCITONIN  LEGIONELLA PNEUMOPHILA SEROGP 1 UR AG  STREP PNEUMONIAE URINARY ANTIGEN  BASIC METABOLIC PANEL  CBC     EKG  ED ECG REPORT I, Rudene Re, the attending physician, personally viewed and interpreted this ECG.  Sinus rhythm with a rate of 54, right bundle branch block, left posterior fascicular block, prolonged QTC, no ST elevations.  No significant changes when compared to prior   RADIOLOGY I have personally reviewed the images performed during this visit and I agree with the Radiologist's read.   Interpretation by Radiologist:  DG Chest 2 View  Result Date: 02/14/2021 CLINICAL DATA:  Shortness of breath. EXAM: CHEST - 2 VIEW COMPARISON:  February 08, 2021. FINDINGS: Stable cardiomediastinal silhouette. Mild bibasilar subsegmental atelectasis is noted. Minimal pleural effusions may be present. Bony thorax is unremarkable. IMPRESSION: Mild bibasilar subsegmental atelectasis with minimal pleural effusions. Electronically Signed   By: Marijo Conception M.D.   On: 02/14/2021 19:09   CT Angio Chest PE W and/or Wo Contrast  Result Date: 02/15/2021 CLINICAL DATA:  Shortness of breath and labored breathing. EXAM: CT ANGIOGRAPHY CHEST WITH CONTRAST TECHNIQUE: Multidetector CT imaging of the chest was performed using the standard protocol during bolus administration of intravenous contrast. Multiplanar CT image reconstructions and MIPs were obtained to evaluate the vascular anatomy. CONTRAST:  24mL OMNIPAQUE IOHEXOL 350 MG/ML SOLN COMPARISON:  October 05, 2020 FINDINGS: Cardiovascular: There is mild calcification of the aortic arch, without evidence of aortic aneurysm. A tiny (2.8 mm) focus of intraluminal low attenuation is seen involving and anterior lower lobe branch of the left pulmonary artery (axial CT image 169, CT series 5). There is mild cardiomegaly. No pericardial effusion. Mediastinum/Nodes: Mild pretracheal lymphadenopathy is noted. Thyroid gland, trachea, and  esophagus demonstrate no significant findings. Lungs/Pleura: Moderate severity areas of scarring and/or atelectasis are seen within the bilateral apices with mild posterior left basilar atelectasis. Moderate severity infiltrate is seen within the posterolateral aspect of the right lower lobe. A very small left pleural effusion is present. No pneumothorax is identified. Upper Abdomen: Multiple surgical clips are seen within the gallbladder fossa. Musculoskeletal: No chest wall abnormality. No acute or significant osseous findings. Review of the MIP images confirms the above findings. IMPRESSION: 1. Tiny focus of low attenuation along a lower lobe branch of the left pulmonary artery which may represent a small amount of artifact. Correlation with follow-up chest CTA or nuclear medicine ventilation/perfusion scan  is recommended if pulmonary embolism remains of clinical concern. 2. Moderate severity posterolateral right lower lobe infiltrate infiltrate. 3. Very small left pleural effusion. 4. Moderate severity areas of scarring and/or atelectasis are seen within the bilateral apices with mild posterior left basilar atelectasis. 5. Mild cardiomegaly. 6. Aortic atherosclerosis. Aortic Atherosclerosis (ICD10-I70.0). Electronically Signed   By: Virgina Norfolk M.D.   On: 02/15/2021 03:09      PROCEDURES:  Critical Care performed: Yes, see critical care procedure note(s)  .Critical Care Performed by: Rudene Re, MD Authorized by: Rudene Re, MD   Critical care provider statement:    Critical care time (minutes):  30   Critical care time was exclusive of:  Separately billable procedures and treating other patients   Critical care was necessary to treat or prevent imminent or life-threatening deterioration of the following conditions:  Cardiac failure, circulatory failure, dehydration, respiratory failure, sepsis and shock   Critical care was time spent personally by me on the following  activities:  Development of treatment plan with patient or surrogate, discussions with consultants, evaluation of patient's response to treatment, examination of patient, ordering and review of laboratory studies, ordering and review of radiographic studies, ordering and performing treatments and interventions, pulse oximetry, re-evaluation of patient's condition and review of old charts   I assumed direction of critical care for this patient from another provider in my specialty: no     Care discussed with: admitting provider     [facility]: Medications  ceFEPIme (MAXIPIME) 1 g in sodium chloride 0.9 % 100 mL IVPB (1 g Intravenous New Bag/Given 02/15/21 0437)  amiodarone (PACERONE) tablet 100 mg (has no administration in time range)  ALPRAZolam (XANAX) tablet 0.25 mg (has no administration in time range)  levothyroxine (SYNTHROID) tablet 50 mcg (has no administration in time range)  senna-docusate (Senokot-S) tablet 1 tablet (has no administration in time range)  ferrous sulfate tablet 325 mg (has no administration in time range)  Rivaroxaban (XARELTO) tablet 15 mg (has no administration in time range)  multivitamin with minerals tablet 1 tablet (has no administration in time range)  acetaminophen (TYLENOL) tablet 650 mg (has no administration in time range)    Or  acetaminophen (TYLENOL) suppository 650 mg (has no administration in time range)  traZODone (DESYREL) tablet 25 mg (has no administration in time range)  magnesium hydroxide (MILK OF MAGNESIA) suspension 30 mL (has no administration in time range)  doxycycline (VIBRAMYCIN) 100 mg in sodium chloride 0.9 % 250 mL IVPB (has no administration in time range)  amiodarone (PACERONE) tablet 100 mg (100 mg Oral Given 02/14/21 2249)  Rivaroxaban (XARELTO) tablet 15 mg (15 mg Oral Given 02/14/21 2248)  iohexol (OMNIPAQUE) 350 MG/ML injection 75 mL (75 mLs Intravenous Contrast Given 02/15/21 0250)     IMPRESSION / MDM / [place] / ED COURSE  I reviewed the triage vital signs and the nursing notes.  88 y.o. female history of atrial fibrillation on Xarelto, prior pulmonary embolism, systolic CHF who presents for evaluation of shortness of breath.  Patient with a recent 8 day admission to the hospital and discharged 5 days ago for pneumonia.  Was doing well until this afternoon.  Had 1 episode of shortness of breath which resolved with Xanax.  Had a recurrent episode a couple of hours later.  Patient does report feeling anxious when she had some difficulty breathing at home.  After being reassured by EMS that her oxygen her EKG was normal patient reports  that her symptoms resolved before she even arrived to the emergency room.  She has been here for almost 9 hours with no further episodes of shortness of breath.  On evaluation she is well-appearing but slightly tachypneic with respiratory rate in the mid 20s, she is satting 100% on room air, lungs are clear to auscultation with good air movement, she does have crackles bilaterally.  History is gathered from patient and her daughter who is at bedside and lives with her.  They both denied any known history of lung disease.  I did review patient's chart from her recent hospitalization and her most recent echocardiogram from September 2022 showing EF of 40 to 45%.  Patient does not look grossly volume overloaded with no elevated JVD and no pitting edema of bilateral lower extremities but due to crackles we will get a chest x-ray and a BNP.  Her COVID and flu here are negative.  Her chest x-ray was visualized by me showing mild atelectasis and small pleural effusions, read confirmed by radiology.  Her BNP slightly elevated at 483 her first troponin was 34, the repeat troponin is pending.  There is no signs of sepsis with no fever or leukocytosis.  Due to recent hospital admission and history of PE we will send patient for CT angio to rule out a PE, pericardial effusion, or occult  pneumonia.  If that is negative patient will most likely benefit from a IV dose of Lasix.  Patient was placed on telemetry for close monitoring of cardiorespiratory status.  _________________________ 4:37 AM on 02/15/2021 ----------------------------------------- CT visualized by me concerning for a right lower lobe pneumonia.  Radiologist found a tiny focus of low-attenuation along the lower lobe branch of the left pulmonary artery which may be artifact versus a small PE and recommended VQ scan.  Patient remains tachypneic but with no hypoxia.  Due to recent admission and now patient feeling clinically worse, tachypneic and with mildly elevated troponins I consulted the hospitalist for admission.  They recommended reinitiating cefepime and switching to doxycycline instead of azithromycin due to history of prolonged QT.  We decided to keep the same medication since patient responded well to this medication previously.  The other option will be Levaquin and that would not be a good drug in the setting of prolonged QT.  We discussed need for admission for IV antibiotics with patient and her daughter who are both in agreement.           FINAL CLINICAL IMPRESSION(S) / ED DIAGNOSES   Final diagnoses:  Healthcare-associated pneumonia     Rx / DC Orders   ED Discharge Orders     None        Note:  This document was prepared using Dragon voice recognition software and may include unintentional dictation errors.    Alfred Levins, [place], MD 02/15/21 [phone]

## 2021-02-15 NOTE — Progress Notes (Signed)
Pharmacy Antibiotic Note  Frances Kent is a 88 y.o. female admitted on 02/15/2021 with CAP.  Pharmacy has been consulted for Vancomycin and Cefepime dosing.  -recently admitted for atypical pneumonia on 02/02/21 and discharged on 12/28.  Plan: Initial dose of Vancomycin 750 mg once. -Vancomycin 500 mg IV Q 24 hrs.  Goal AUC 400-550. Expected AUC: 532 SCr used: 0.95, TBW 45.4 kg < IBW 52.4 kg  Patient received Cefepime 1 gram x 1 in ED. Will order another dose of Cefepime 1 gram for a total of 2 gm this am 02/16/20. -Will Continue with Cefepime 2 gm IV q24h  for Crcl 28.2 ml/min  (boderline- f/u Crcl)  Pharmacy will continue to follow and adjust abx dosing if warranted.   Height: 5\' 3"  (160 cm) Weight: 45.4 kg (100 lb) IBW/kg (Calculated) : 52.4  Temp (24hrs), Avg:98.2 F (36.8 C), Min:98 F (36.7 C), Max:98.3 F (36.8 C)  Recent Labs  Lab 02/08/21 0802 02/09/21 0533 02/14/21 1832 02/15/21 0408 02/15/21 0500  WBC 8.5 8.6 8.4  --  8.6  CREATININE 1.04* 0.92 0.95  --  0.99  LATICACIDVEN  --   --   --  1.2  --      Estimated Creatinine Clearance: 28.2 mL/min (by C-G formula based on SCr of 0.99 mg/dL).    Allergies  Allergen Reactions   Statins     Severe myalgias    Antimicrobials this admission: 1/02 Cefepime >> x 1 1/02 Doxycycline >> 1/2 1/02 Vancomycin >> 1/2 Cefepime >>  Microbiology results: 01/02 BCx: Pending  Thank you for allowing pharmacy to be a part of this patients care.  Chinita Greenland PharmD Clinical Pharmacist 02/15/2021

## 2021-02-15 NOTE — Progress Notes (Signed)
Pharmacy Antibiotic Note  Frances Kent is a 88 y.o. female admitted on 02/15/2021 with CAP.  Pharmacy has been consulted for Vancomycin dosing.  Plan: Initial dose of Vancomycin 750 mg once. Vancomycin 500 mg IV Q 24 hrs.  Goal AUC 400-550. Expected AUC: 532 SCr used: 0.95, TBW 45.4 kg < IBW 52.4 kg  Pharmacy will continue to follow and adjust abx dosing if warranted.   Height: 5\' 3"  (160 cm) Weight: 45.4 kg (100 lb) IBW/kg (Calculated) : 52.4  Temp (24hrs), Avg:98.2 F (36.8 C), Min:98 F (36.7 C), Max:98.3 F (36.8 C)  Recent Labs  Lab 02/08/21 0802 02/09/21 0533 02/14/21 1832  WBC 8.5 8.6 8.4  CREATININE 1.04* 0.92 0.95    Estimated Creatinine Clearance: 29.3 mL/min (by C-G formula based on SCr of 0.95 mg/dL).    Allergies  Allergen Reactions   Statins     Severe myalgias    Antimicrobials this admission: 1/02 Cefepime >> x 1 1/02 Doxycycline >>  1/02 Vancomycin >>  Microbiology results: 01/02 BCx: Pending  Thank you for allowing pharmacy to be a part of this patients care.  Renda Rolls, PharmD, [facility] 02/15/2021 4:51 AM

## 2021-02-15 NOTE — Progress Notes (Addendum)
Brief hospitalist update note.  This is a nonbillable note.  Please see same-day H&P for full billable details.  Briefly, this is an 88 year old Caucasian female history significant for hypertension, hyperlipidemia, CVA, GERD, hypothyroid, depression, interstitial lung disease, atrial fibrillation/PE on Xarelto, mitral valve prolapse, CH F with EF 40 to 45% recently admitted and treated for atypical pneumonia on 12/20, discharged on 12/28.  Doing well until yesterday when patient started having worsening dyspnea with associated tachypnea.  Unclear whether this represents a true infection versus exacerbation of interstitial lung disease versus possible small PE.  Mild exacerbation of CHF remains on differential though BNP appears to be at baseline.  PE felt unlikely given reported adherence to home anticoagulant regimen.  Plan: Discontinue antibiotics given negative procalcitonin Restart Lasix 40 mg p.o. daily Bronchodilators Mucolytic's and antitussives Oxygen as necessary, wean as tolerated  Ralene Muskrat MD

## 2021-02-16 ENCOUNTER — Encounter: Payer: Self-pay | Admitting: Internal Medicine

## 2021-02-16 ENCOUNTER — Observation Stay: Payer: Medicare HMO

## 2021-02-16 ENCOUNTER — Inpatient Hospital Stay: Payer: Medicare HMO

## 2021-02-16 DIAGNOSIS — J189 Pneumonia, unspecified organism: Secondary | ICD-10-CM | POA: Diagnosis not present

## 2021-02-16 DIAGNOSIS — R0603 Acute respiratory distress: Secondary | ICD-10-CM | POA: Diagnosis not present

## 2021-02-16 DIAGNOSIS — Z8249 Family history of ischemic heart disease and other diseases of the circulatory system: Secondary | ICD-10-CM | POA: Diagnosis not present

## 2021-02-16 DIAGNOSIS — E039 Hypothyroidism, unspecified: Secondary | ICD-10-CM | POA: Diagnosis present

## 2021-02-16 DIAGNOSIS — I110 Hypertensive heart disease with heart failure: Secondary | ICD-10-CM | POA: Diagnosis present

## 2021-02-16 DIAGNOSIS — Y95 Nosocomial condition: Secondary | ICD-10-CM | POA: Diagnosis present

## 2021-02-16 DIAGNOSIS — J849 Interstitial pulmonary disease, unspecified: Secondary | ICD-10-CM | POA: Diagnosis present

## 2021-02-16 DIAGNOSIS — I6523 Occlusion and stenosis of bilateral carotid arteries: Secondary | ICD-10-CM | POA: Diagnosis present

## 2021-02-16 DIAGNOSIS — Z800 Family history of malignant neoplasm of digestive organs: Secondary | ICD-10-CM | POA: Diagnosis not present

## 2021-02-16 DIAGNOSIS — D649 Anemia, unspecified: Secondary | ICD-10-CM | POA: Diagnosis present

## 2021-02-16 DIAGNOSIS — I452 Bifascicular block: Secondary | ICD-10-CM | POA: Diagnosis present

## 2021-02-16 DIAGNOSIS — I509 Heart failure, unspecified: Secondary | ICD-10-CM

## 2021-02-16 DIAGNOSIS — Z20822 Contact with and (suspected) exposure to covid-19: Secondary | ICD-10-CM | POA: Diagnosis present

## 2021-02-16 DIAGNOSIS — K219 Gastro-esophageal reflux disease without esophagitis: Secondary | ICD-10-CM | POA: Diagnosis present

## 2021-02-16 DIAGNOSIS — Z86711 Personal history of pulmonary embolism: Secondary | ICD-10-CM | POA: Diagnosis not present

## 2021-02-16 DIAGNOSIS — H3530 Unspecified macular degeneration: Secondary | ICD-10-CM | POA: Diagnosis present

## 2021-02-16 DIAGNOSIS — Z7901 Long term (current) use of anticoagulants: Secondary | ICD-10-CM | POA: Diagnosis not present

## 2021-02-16 DIAGNOSIS — E785 Hyperlipidemia, unspecified: Secondary | ICD-10-CM | POA: Diagnosis present

## 2021-02-16 DIAGNOSIS — Z803 Family history of malignant neoplasm of breast: Secondary | ICD-10-CM | POA: Diagnosis not present

## 2021-02-16 DIAGNOSIS — F419 Anxiety disorder, unspecified: Secondary | ICD-10-CM | POA: Diagnosis present

## 2021-02-16 DIAGNOSIS — R54 Age-related physical debility: Secondary | ICD-10-CM | POA: Diagnosis present

## 2021-02-16 DIAGNOSIS — I517 Cardiomegaly: Secondary | ICD-10-CM | POA: Diagnosis not present

## 2021-02-16 DIAGNOSIS — Z8673 Personal history of transient ischemic attack (TIA), and cerebral infarction without residual deficits: Secondary | ICD-10-CM | POA: Diagnosis not present

## 2021-02-16 DIAGNOSIS — Z823 Family history of stroke: Secondary | ICD-10-CM | POA: Diagnosis not present

## 2021-02-16 DIAGNOSIS — I480 Paroxysmal atrial fibrillation: Secondary | ICD-10-CM | POA: Diagnosis not present

## 2021-02-16 DIAGNOSIS — R0600 Dyspnea, unspecified: Secondary | ICD-10-CM | POA: Diagnosis not present

## 2021-02-16 DIAGNOSIS — I081 Rheumatic disorders of both mitral and tricuspid valves: Secondary | ICD-10-CM | POA: Diagnosis present

## 2021-02-16 DIAGNOSIS — I5022 Chronic systolic (congestive) heart failure: Secondary | ICD-10-CM | POA: Diagnosis present

## 2021-02-16 DIAGNOSIS — Z888 Allergy status to other drugs, medicaments and biological substances status: Secondary | ICD-10-CM | POA: Diagnosis not present

## 2021-02-16 DIAGNOSIS — Z66 Do not resuscitate: Secondary | ICD-10-CM | POA: Diagnosis present

## 2021-02-16 DIAGNOSIS — R0602 Shortness of breath: Secondary | ICD-10-CM | POA: Diagnosis present

## 2021-02-16 DIAGNOSIS — R0902 Hypoxemia: Secondary | ICD-10-CM | POA: Diagnosis not present

## 2021-02-16 LAB — CREATININE, SERUM
Creatinine, Ser: 1.03 mg/dL — ABNORMAL HIGH (ref 0.44–1.00)
GFR, Estimated: 52 mL/min — ABNORMAL LOW (ref >60)

## 2021-02-16 LAB — LEGIONELLA PNEUMOPHILA SEROGP 1 UR AG: L. pneumophila Serogp 1 Ur Ag: NEGATIVE

## 2021-02-16 MED ORDER — FUROSEMIDE 10 MG/ML IJ SOLN
20.0000 mg | Freq: Two times a day (BID) | INTRAMUSCULAR | Status: DC
  Administered 2021-02-16 (×2): 20 mg via INTRAVENOUS
  Filled 2021-02-16 (×3): qty 4

## 2021-02-16 MED ORDER — BOOST / RESOURCE BREEZE PO LIQD CUSTOM
1.0000 | ORAL | Status: DC
  Administered 2021-02-16: 1 via ORAL

## 2021-02-16 MED ORDER — ENSURE ENLIVE PO LIQD: 237.0000 mL | Freq: Two times a day (BID) | ORAL | Status: DC

## 2021-02-16 MED ORDER — TECHNETIUM TO 99M ALBUMIN AGGREGATED
4.0000 | Freq: Once | INTRAVENOUS | Status: AC | PRN
  Administered 2021-02-16: 4.5 via INTRAVENOUS

## 2021-02-16 NOTE — Evaluation (Signed)
Physical Therapy Evaluation Patient Details Name: Frances Kent MRN: [id number] DOB: 04-Sep-1930 Today's Date: 02/16/2021  History of Present Illness  88 y.o. female with medical history significant for  hypertension, hyperlipidemia, stroke, GERD, hypothyroidism, depression with anxiety, interstitial lung disease, anemia, SSS, SVT, PE and atrial fibrillation on Xarelto, mitral valve prolapse, CHF with EF of 40-45%, bilateral carotid artery stenosis, who was recently admitted for atypical pneumonia on 12/20 and discharged on 12/28.  She has been doing fairly well until 1/1 when she started having worsening dyspnea with associated tachypnea.    Clinical Impression  Pt received seated in recliner upon arrival to room.  Pt agreeable to therapy following OT session, but requested not to perform any mobility at this time due to fatigue.  Pt was able to participate in seated exercises, and demonstrates adequate ROM and strength to perform the exercises.  Pt does however have grossly 4/5 strength bilaterally.  Pt encouraged to continue with exercises during hospital stay to remain active and assist with cardiopulmonary function.  Pt agreeable and will continue to benefit from therapy during stay.  Pt will benefit from skilled PT intervention to increase independence and safety with basic mobility in preparation for discharge to the venue listed below.        Recommendations for follow up therapy are one component of a multi-disciplinary discharge planning process, led by the attending physician.  Recommendations may be updated based on patient status, additional functional criteria and insurance authorization.  Follow Up Recommendations Home health PT    Assistance Recommended at Discharge Frequent or constant Supervision/Assistance  Patient can return home with the following       Equipment Recommendations None recommended by PT (pt has needed DME at home already)  Recommendations for Other  Services       Functional Status Assessment Patient has had a recent decline in their functional status and demonstrates the ability to make significant improvements in function in a reasonable and predictable amount of time.     Precautions / Restrictions Precautions Precautions: Fall Restrictions Weight Bearing Restrictions: No      Mobility  Bed Mobility Overal bed mobility: Needs Assistance Bed Mobility: Supine to Sit     Supine to sit: Supervision;HOB elevated     General bed mobility comments: pt upright in recliner upon arrival to room.  pt just finished with OT and ambulated with Ot, so deferred mobility during session with PT.    Transfers Overall transfer level: Needs assistance Equipment used: Rolling walker (2 wheels) Transfers: Sit to/from Stand Sit to Stand: Supervision                Ambulation/Gait                  Stairs            Wheelchair Mobility    Modified Rankin (Stroke Patients Only)       Balance Overall balance assessment: Needs assistance Sitting-balance support: No upper extremity supported;Feet supported Sitting balance-Leahy Scale: Good Sitting balance - Comments: steady sitting reaching within BOS   Standing balance support: No upper extremity supported;During functional activity Standing balance-Leahy Scale: Good Standing balance comment: steadying balance reaching within and outside BOS durnig standing hand hygiene                             Pertinent Vitals/Pain Pain Assessment: Faces Faces Pain Scale: Hurts a little bit Pain Location: low  back Pain Descriptors / Indicators: Aching;Sore Pain Intervention(s): Limited activity within patient's tolerance;Monitored during session;Repositioned    Home Living Family/patient expects to be discharged to:: Private residence Living Arrangements: Other relatives;Children (Granddaughter) Available Help at Discharge: Family;Available 24  hours/day Type of Home: House Home Access: Level entry       Home Layout: Laundry or work area in basement;Able to live on main level with bedroom/bathroom;Two level Home Equipment: Conservation officer, nature (2 wheels);Rollator (4 wheels);BSC/3in1;Wheelchair - manual;Grab bars - tub/shower;Shower seat - built in;Shower seat      Prior Function Prior Level of Function : Independent/Modified Independent             Mobility Comments: RW for household distances. 1 fall in last 6 months (Dec 2022). Following Dec 2022 admission, pt requiring MIN A for sit>stand transfers at home ADLs Comments: Family assists with IADL management including cooking, laundry, and shopping. Pt requires occasional assist with LB dressing tasks such as donning/doffing socks. Following Dec 2022 admission, pt requiring MIN A for sit>stand LB dressing at home     Hand Dominance   Dominant Hand: Left    Extremity/Trunk Assessment   Upper Extremity Assessment Upper Extremity Assessment: Generalized weakness    Lower Extremity Assessment Lower Extremity Assessment: Generalized weakness    Cervical / Trunk Assessment Cervical / Trunk Assessment: Kyphotic  Communication   Communication: No difficulties  Cognition Arousal/Alertness: Awake/alert Behavior During Therapy: WFL for tasks assessed/performed Overall Cognitive Status: Within Functional Limits for tasks assessed                                          General Comments General comments (skin integrity, edema, etc.): At start of session, pt's daughter reporting that pt had low BP this PM. While in semi-fowler's position, vitals obtained at start of session: SpO2 97%, HR 74, BP 110/47 (MAP 65). During functional mobility/standing grooming tasks, pt denying dizziness throughout. Once seated in recliner, BP 103/53 (MAP 69) & SpO2 98%    Exercises Total Joint Exercises Ankle Circles/Pumps: AROM;Strengthening;Both;20 reps;Seated Quad Sets:  AROM;Strengthening;Both;10 reps;Seated Gluteal Sets: AROM;Strengthening;Both;10 reps;Seated Long Arc Quad: AROM;Strengthening;Both;10 reps;Seated Marching in Standing: AROM;Strengthening;Both;10 reps;Seated Other Exercises Other Exercises: Role of PT in acute setting, d/c recs, safe use of DME, therex   Assessment/Plan    PT Assessment Patient needs continued PT services  PT Problem List Decreased balance;Decreased mobility;Decreased knowledge of use of DME;Decreased strength       PT Treatment Interventions DME instruction;Gait training;Stair training;Functional mobility training;Therapeutic activities;Therapeutic exercise;Balance training;Patient/family education    PT Goals (Current goals can be found in the Care Plan section)  Acute Rehab PT Goals Patient Stated Goal: to return home PT Goal Formulation: With patient Time For Goal Achievement: 03/02/21 Potential to Achieve Goals: Good    Frequency Min 2X/week     Co-evaluation               AM-PAC PT "6 Clicks" Mobility  Outcome Measure Help needed turning from your back to your side while in a flat bed without using bedrails?: A Little Help needed moving from lying on your back to sitting on the side of a flat bed without using bedrails?: A Little Help needed moving to and from a bed to a chair (including a wheelchair)?: A Little Help needed standing up from a chair using your arms (e.g., wheelchair or bedside chair)?: A Little Help needed to  walk in hospital room?: A Little Help needed climbing 3-5 steps with a railing? : A Lot 6 Click Score: 17    End of Session   Activity Tolerance: Patient limited by fatigue Patient left: with call bell/phone within reach;with family/visitor present;in chair;with chair alarm set Nurse Communication: Mobility status PT Visit Diagnosis: Difficulty in walking, not elsewhere classified (R26.2);Muscle weakness (generalized) (M62.81)    Time: 1028-9022 PT Time Calculation (min)  (ACUTE ONLY): 13 min   Charges:     PT Treatments $Therapeutic Exercise: 8-22 mins        Gwenlyn Saran, PT, DPT 02/16/21, 4:57 PM   Christie Nottingham 02/16/2021, 4:54 PM

## 2021-02-16 NOTE — TOC Initial Note (Signed)
Transition of Care [facility]) - Initial/Assessment Note    Patient Details  Name: Frances Kent MRN: [id number] Date of Birth: [birth year]  [facility]) CM/SW Contact:    Beverly Sessions, RN Phone Number: 02/16/2021, 3:34 PM  Clinical Narrative:                  Patient admitted for respiratory failure Patient with extreme risk for readmission score.  Patient was assessed by [facility] on 12/27  See note from that time below "CSW spoke with patient's daughter Frances Kent regarding home health services, agreeable to [facility] services with no preference of agency. Referral sent to [facility] with Humberto Seals.    Frances Kent requested list of private care agencies as she may look into hiring additional help, list has been emailed to her.    Frances Kent reports they have all DME needs at home and confirmed patient will be going to home address of [place] in [place] upon discharge.    No further questions or concerns at this time. "   Patient active with [facility].  Cory with [facility] notified of admission.  PT OT eval pending  Expected Discharge Plan: [place] Barriers to Discharge: Continued Medical Work up   Patient Goals and CMS Choice        Expected Discharge Plan and Services Expected Discharge Plan: [place] Agency: [place] Date [facility] Agency Contacted: 02/16/21   Representative spoke with at [place]: Tommi Rumps  Prior Living Arrangements/Services     Patient language and need for interpreter reviewed:: Yes        Need for Family Participation in Patient Care: Yes (Comment) Care giver support system in place?: Yes (comment)   Criminal Activity/Legal Involvement Pertinent to Current Situation/Hospitalization: No - Comment as needed  Activities of Daily Living Home Assistive Devices/Equipment: Dentures (specify type), Eyeglasses, Walker (specify type) (upper and lower dentures) ADL  Screening (condition at time of admission) Patient's cognitive ability adequate to safely complete daily activities?: Yes Is the patient deaf or have difficulty hearing?: No Does the patient have difficulty seeing, even when wearing glasses/contacts?: No Does the patient have difficulty concentrating, remembering, or making decisions?: No Patient able to express need for assistance with ADLs?: Yes Does the patient have difficulty dressing or bathing?: Yes Independently performs ADLs?: No Communication: Independent Dressing (OT): Needs assistance Is this a change from baseline?: Pre-admission baseline Grooming: Needs assistance Is this a change from baseline?: Pre-admission baseline Feeding: Independent Bathing: Needs assistance Is this a change from baseline?: Pre-admission baseline Toileting: Needs assistance Is this a change from baseline?: Pre-admission baseline In/Out Bed: Needs assistance (rolling walker) Is this a change from baseline?: Pre-admission baseline Walks in Home: Independent with device (comment) Does the patient have difficulty walking or climbing stairs?: Yes Weakness of Legs: Both Weakness of Arms/Hands: Both  Permission Sought/Granted                  Emotional Assessment       Orientation: : Oriented to Self, Oriented to Place, Oriented to  Time, Oriented to Situation Alcohol / Substance Use: Not Applicable Psych Involvement: No (comment)  Admission diagnosis:  Pneumonia [J18.9] Healthcare-associated pneumonia [J18.9] CAP (community acquired pneumonia) [J18.9] Acute decompensated heart  failure [facility]) [I50.9] Patient Active Problem List   Diagnosis Date Noted   Acute decompensated heart failure ([place]) 02/16/2021   Pneumonia 02/15/2021   CAP (community acquired pneumonia) 02/15/2021   HCAP (healthcare-associated pneumonia) 02/02/2021   Severe sepsis ([place]) 02/02/2021   Depression with anxiety 02/02/2021   Iron deficiency anemia 01/25/2021    Symptomatic anemia 01/13/2021   History of embolic stroke 84/66/5993   Inflamed external hemorrhoid 12/08/2020   Insomnia due to anxiety and fear 12/08/2020   Long term (current) use of anticoagulants    Chronic HFrEF (heart failure with reduced ejection fraction) ([place])    Left basal ganglia embolic stroke ([place]) 57/02/7791   Malnutrition of moderate degree ([place]) 11/03/2020   Prolonged QT interval    Acute on chronic systolic (congestive) heart failure (HCC)    Persistent atrial fibrillation ([place]) 10/30/2020   Antibiotic-associated diarrhea 10/15/2020   ILD (interstitial lung disease) ([place]) 09/22/2020   Atrial flutter, paroxysmal ([place]) 09/10/2020   Tachyarrhythmia 09/08/2020   Sick sinus syndrome ([place]) 09/08/2020   Elevated serum free T4 level 09/08/2020   Acute respiratory failure with hypoxia ([place]) 08/28/2020   Purpura senilis ([place]) 08/28/2020   Hiatal hernia 08/09/2020   Atrial fibrillation with rapid ventricular response ([place]) 08/06/2020   Thrombophilia ([place]) 08/06/2020   Elevated troponin I level 05/21/2020   CVA (cerebral vascular accident) ([place]) 03/24/2020   History of COVID-19 03/03/2020   Bilateral carotid artery stenosis 12/23/2019   Schatzki's ring of distal esophagus 12/11/2019   Erosive gastritis 11/30/2019   Dysphagia 11/27/2019   Painful swallowing 11/27/2019   Gastroesophageal reflux disease 11/27/2019   Stage 3a chronic kidney disease ([place]) 04/11/2019   Coagulopathy ([place]) 09/12/2018   Essential hypertension 90/30/0923   Chronic systolic CHF (congestive heart failure) ([place]) 08/15/2018   Hypothyroidism due to acquired atrophy of thyroid 05/08/2018   Unintentional weight loss 05/08/2018   Myalgia due to statin 05/08/2018   Abdominal aortic atherosclerosis ([place]) 05/07/2018   Bradycardia 04/28/2018   Pleural effusion on left 02/27/2018   Moderate mitral stenosis 01/10/2018   Lumbar radiculitis 01/07/2018   B12 deficiency 07/26/2017   Atrial fibrillation status  post cardioversion [facility]) 04/06/2016   Hospital discharge follow-up 03/10/2016   Vitamin D deficiency 04/08/2015   Cervical spine degeneration 12/13/2014   History of pulmonary embolism 12/02/2014   Insomnia 10/04/2014   Moderate tricuspid insufficiency 09/17/2014   Generalized anxiety disorder 03/28/2013   Mitral valve prolapse 03/28/2013   Routine adult health maintenance 03/23/2012   Fatigue 03/23/2012   Cough 04/17/2011   Screening for breast cancer 11/29/2010   Macular degeneration, left eye 11/29/2010   Screening for colon cancer 11/29/2010   Hyperlipidemia LDL goal <70 11/29/2010   GERD (gastroesophageal reflux disease)    PCP:  [name], MD Pharmacy:   [facility] [address], [place] - [place] [address] Phone: [phone] Fax: [phone]  [facility] [place], [place] [address] Phone: [phone] Fax: [phone]  [id number] [place], [place] - [address] AT [place] [address] Phone: [phone] Fax: [phone]  [facility] [address] [place] [address] Phone: [phone] Fax: [phone]     Social Determinants of Health (SDOH) Interventions    Readmission Risk Interventions Readmission Risk Prevention Plan 02/16/2021 01/14/2021 11/01/2020  Transportation Screening Complete  Complete Complete  PCP or Specialist Appt within 5-7 Days - - Complete  Medication Review (RN CM) - - Complete  Medication Review (RN Care Manager) Complete Complete -  PCP or Specialist appointment within 3-5 days of discharge - Complete -  [place] or Home Care Consult Complete Complete -  SW Recovery Care/Counseling Consult Complete Complete -  Palliative Care Screening Not Applicable Not Applicable -  [place] - Not Applicable -   Some recent data might be hidden

## 2021-02-16 NOTE — Evaluation (Signed)
Occupational Therapy Evaluation Patient Details Name: Frances Kent MRN: [id number] DOB: [birth year] Today's Date: 02/16/2021   History of Present Illness 88 y.o. female with medical history significant for  hypertension, hyperlipidemia, stroke, GERD, hypothyroidism, depression with anxiety, interstitial lung disease, anemia, SSS, SVT, PE and atrial fibrillation on Xarelto, mitral valve prolapse, CHF with EF of 40-45%, bilateral carotid artery stenosis, who was recently admitted for atypical pneumonia on 12/20 and discharged on 12/28.  She has been doing fairly well until 1/1 when she started having worsening dyspnea with associated tachypnea.   Clinical Impression   Pt seen for OT evaluation this date (radiology results indicating "very low probability examination for pulmonary embolism"). This Pryor Curia is familiar with pt from recent admission. At baseline, pt is mod-independent with RW for functional mobility and ADLs, and lives in a 1-level home with family. Since recent admission, pt was receiving MIN A from family for sit>stand transfers and MIN A for sit>stand LB dressing, but pt's daughter reports that pt's activity tolerance was improving. Pt currently presents with decreased activity tolerance and strength compared to baseline. Due to these functional impairments, pt requires SUPERVISION for bed mobility, SUPERVISION for sit>stand transfers from EOB, SUPERVISION for standing grooming tasks, and SUPERVISION for functional mobility of short household distances (~72ft) with RW. Of note, pt's daughter reported that pt had low BP this PM, however MAP 65-69 during session and pt denying dizziness throughout (see below for more information). Pt would benefit from additional skilled OT services to maximize return to PLOF and minimize risk of future falls, injury, caregiver burden, and readmission. Upon discharge, recommend [place] services.         Recommendations for follow up therapy are one component of  a multi-disciplinary discharge planning process, led by the attending physician.  Recommendations may be updated based on patient status, additional functional criteria and insurance authorization.   Follow Up Recommendations  Home health OT    Assistance Recommended at Discharge Frequent or constant Supervision/Assistance     Functional Status Assessment  Patient has had a recent decline in their functional status and demonstrates the ability to make significant improvements in function in a reasonable and predictable amount of time.  Equipment Recommendations  None recommended by OT       Precautions / Restrictions Precautions Precautions: Fall Restrictions Weight Bearing Restrictions: No      Mobility Bed Mobility Overal bed mobility: Needs Assistance Bed Mobility: Supine to Sit     Supine to sit: Supervision;HOB elevated          Transfers Overall transfer level: Needs assistance Equipment used: Rolling walker (2 wheels) Transfers: Sit to/from Stand Sit to Stand: Supervision                  Balance Overall balance assessment: Needs assistance Sitting-balance support: No upper extremity supported;Feet supported Sitting balance-Leahy Scale: Good Sitting balance - Comments: steady sitting reaching within BOS   Standing balance support: No upper extremity supported;During functional activity Standing balance-Leahy Scale: Good Standing balance comment: steadying balance reaching within and outside BOS durnig standing hand hygiene                           ADL either performed or assessed with clinical judgement   ADL Overall ADL's : Needs assistance/impaired     Grooming: Wash/dry hands;Supervision/safety;Standing;Set up;Sitting Grooming Details (indicate cue type and reason): Requires set-up assist for seated grooming tasks (e.g., to apply lotion to her  hands while seated in recliner)             Lower Body Dressing: Set up;Bed  level Lower Body Dressing Details (indicate cue type and reason): to don socks             Functional mobility during ADLs: Supervision/safety;Rolling walker (2 wheels) (to walk 29ft)       Vision Baseline Vision/History: 1 Wears glasses Ability to See in Adequate Light: 0 Adequate Patient Visual Report: No change from baseline              Pertinent Vitals/Pain Pain Assessment: No/denies pain     Hand Dominance Left   Extremity/Trunk Assessment Upper Extremity Assessment Upper Extremity Assessment: Generalized weakness   Lower Extremity Assessment Lower Extremity Assessment: Generalized weakness       Communication Communication Communication: No difficulties   Cognition Arousal/Alertness: Awake/alert Behavior During Therapy: WFL for tasks assessed/performed Overall Cognitive Status: Within Functional Limits for tasks assessed                                       General Comments  At start of session, pt's daughter reporting that pt had low BP this PM. While in semi-fowler's position, vitals obtained at start of session: SpO2 97%, HR 74, BP 110/47 (MAP 65). During functional mobility/standing grooming tasks, pt denying dizziness throughout. Once seated in recliner, BP 103/53 (MAP 69) & SpO2 98%            Home Living Family/patient expects to be discharged to:: Private residence Living Arrangements: Other relatives;Children (Granddaughter) Available Help at Discharge: Family;Available 24 hours/day Type of Home: House Home Access: Level entry     Home Layout: Laundry or work area in basement;Able to live on main level with bedroom/bathroom;Two level     Bathroom Shower/Tub: Occupational psychologist: Standard Bathroom Accessibility: No   Home Equipment: Conservation officer, nature (2 wheels);Rollator (4 wheels);BSC/3in1;Wheelchair - manual;Grab bars - tub/shower;Shower seat - built in;Shower seat          Prior Functioning/Environment  Prior Level of Function : Independent/Modified Independent             Mobility Comments: RW for household distances. 1 fall in last 6 months (Dec 2022). Following Dec 2022 admission, pt requiring MIN A for sit>stand transfers at home ADLs Comments: Family assists with IADL management including cooking, laundry, and shopping. Pt requires occasional assist with LB dressing tasks such as donning/doffing socks. Following Dec 2022 admission, pt requiring MIN A for sit>stand LB dressing at home        OT Problem List: Decreased strength;Decreased activity tolerance;Impaired balance (sitting and/or standing)      OT Treatment/Interventions: Self-care/ADL training;Therapeutic exercise;Energy conservation;DME and/or AE instruction;Therapeutic activities;Patient/family education;Balance training    OT Goals(Current goals can be found in the care plan section) Acute Rehab OT Goals Patient Stated Goal: to go home OT Goal Formulation: With patient Time For Goal Achievement: 03/02/21 Potential to Achieve Goals: Good ADL Goals Pt Will Perform Grooming: with modified independence;standing Pt Will Perform Lower Body Dressing: with modified independence;sit to/from stand Pt Will Transfer to Toilet: with modified independence;ambulating;regular height toilet  OT Frequency: Min 2X/week       AM-PAC OT "6 Clicks" Daily Activity     Outcome Measure Help from another person eating meals?: None Help from another person taking care of personal grooming?: A Little Help from another  person toileting, which includes using toliet, bedpan, or urinal?: A Little Help from another person bathing (including washing, rinsing, drying)?: A Little Help from another person to put on and taking off regular upper body clothing?: A Little Help from another person to put on and taking off regular lower body clothing?: A Little 6 Click Score: 19   End of Session Equipment Utilized During Treatment: Rolling walker (2  wheels) Nurse Communication: Mobility status;Other (comment) (BP pre/post session)  Activity Tolerance: Patient tolerated treatment well Patient left: in chair;with call bell/phone within reach;with chair alarm set  OT Visit Diagnosis: Muscle weakness (generalized) (M62.81)                Time: 4327-6147 OT Time Calculation (min): 24 min Charges:  OT General Charges $OT Visit: 1 Visit OT Evaluation $OT Eval Moderate Complexity: 1 Mod OT Treatments $Self Care/Home Management : 8-22 mins  Fredirick Maudlin, OTR/L [place]

## 2021-02-16 NOTE — Progress Notes (Signed)
PT Cancellation Note  Patient Details Name: Frances Kent MRN: [id number] DOB: 06-Aug-1930   Cancelled Treatment:     Reason Eval/Treat Not Completed: Patient not medically ready. Per chart review, pt pending NM Pulmonary Perfusion to rule out PE. PT to hold evaluation until test complete, results received, and patient cleared for exertional activity.      Gwenlyn Saran, PT, DPT 02/16/21, 2:18 PM

## 2021-02-16 NOTE — Progress Notes (Signed)
PROGRESS NOTE    Frances Kent  EVO:[id number] DOB: 1930/04/22 DOA: 02/15/2021 PCP: [name], MD    Brief Narrative:   88 year old Caucasian female history significant for hypertension, hyperlipidemia, CVA, GERD, hypothyroid, depression, interstitial lung disease, atrial fibrillation/PE on Xarelto, mitral valve prolapse, CH F with EF 40 to 45% recently admitted and treated for atypical pneumonia on 12/20, discharged on 12/28.  Doing well until yesterday when patient started having worsening dyspnea with associated tachypnea.   Unclear whether this represents a true infection versus exacerbation of interstitial lung disease versus possible small PE.  Mild exacerbation of CHF remains on differential though BNP appears to be at baseline.  PE felt unlikely given reported adherence to home anticoagulant regimen.     Assessment & Plan:   Principal Problem:   Pneumonia Active Problems:   CAP (community acquired pneumonia)  Acute hypoxic respiratory failure Multifactorial etiology Possible differentials include infection, decompensated failure Procalcitonin negative, no fevers, no leukocytosis X infection less likely Findings on chest x-ray likely representative of old infection Plan: Hold antibiotics Trend vitals and fever curve Diuretics as below  Acute on chronic systolic congestive heart failure Per patient's daughter patient has not been taking Lasix daily Has not taken several days before admission due to lack of weight gain and no swelling of lower extremities BNP elevated, mild edema on CXR Started on p.o. Lasix on admission, remains short of breath Plan: IV Lasix 20 mg twice daily Strict ins and outs Daily weights Goal-directed medical therapy Therapy evaluations  Questionable PE Mild possible filling defect noted on CT Unlikely given adherence to Xarelto Plan: VQ scan to rule out PE Continue home Xarelto  Atrial fibrillation, paroxysmal Rate  controlled Continue Xarelto and amiodarone per home dose  Hypothyroidism PTA Synthroid  Anxiety PTA Xanax   DVT prophylaxis: Xarelto Code Status: Partial Family Communication: Daughter [name] [phone] on 1/2, daughter-in-law at bedside 1/3 Disposition Plan: Status is: Observation  The patient will require care spanning > 2 midnights and should be moved to inpatient because: Acute decompensated heart failure.  On IV diuretics.      Level of care: Med-Surg  Consultants:  None  Procedures:  None  Antimicrobials: None   Subjective: Patient seen and examined.  Reports improvement but persistent shortness of breath over admission.  Objective: Vitals:   02/15/21 2030 02/15/21 2129 02/16/21 0504 02/16/21 0749  BP: (!) 119/47 (!) 131/57 (!) 128/58 (!) 114/52  Pulse: 71 74 77 73  Resp:  20 16 16   Temp:  98.6 F (37 C) 99 F (37.2 C) 98.2 F (36.8 C)  TempSrc:  Oral Oral   SpO2: 97% 97% 94% 94%  Weight:      Height:        Intake/Output Summary (Last 24 hours) at 02/16/2021 1201 Last data filed at 02/16/2021 0630 Gross per 24 hour  Intake --  Output 100 ml  Net -100 ml   Filed Weights   02/14/21 1824  Weight: 45.4 kg    Examination:  General exam: No acute distress.  Appears frail Respiratory system: Scattered crackles bilaterally.  Normal work of breathing.  2 L Cardiovascular system: S1-S2, regular rate, irregular rhythm, no murmurs, trace pitting edema Gastrointestinal system: Soft, NT/ND, normal bowel sounds Central nervous system: Alert and oriented. No focal neurological deficits. Extremities: Symmetric 5 x 5 power. Skin: No rashes, lesions or ulcers Psychiatry: Judgement and insight appear normal. Mood & affect appropriate.     Data Reviewed: I have personally  reviewed following labs and imaging studies  CBC: Recent Labs  Lab 02/14/21 1832 02/15/21 0500  WBC 8.4 8.6  NEUTROABS 5.5  --   HGB 10.2* 9.6*  HCT 33.5* 31.7*  MCV  87.0 85.9  PLT 492* 295*   Basic Metabolic Panel: Recent Labs  Lab 02/14/21 1832 02/15/21 0500 02/16/21 0408  NA 138 139  --   K 4.2 4.1  --   CL 108 109  --   CO2 25 24  --   GLUCOSE 96 90  --   BUN 16 13  --   CREATININE 0.95 0.99 1.03*  CALCIUM 8.9 8.7*  --    GFR: Estimated Creatinine Clearance: 27.1 mL/min (A) (by C-G formula based on SCr of 1.03 mg/dL (H)). Liver Function Tests: Recent Labs  Lab 02/14/21 1832  AST 27  ALT 25  ALKPHOS 100  BILITOT 0.6  PROT 6.5  ALBUMIN 2.9*   No results for input(s): LIPASE, AMYLASE in the last 168 hours. No results for input(s): AMMONIA in the last 168 hours. Coagulation Profile: No results for input(s): INR, PROTIME in the last 168 hours. Cardiac Enzymes: No results for input(s): CKTOTAL, CKMB, CKMBINDEX, TROPONINI in the last 168 hours. BNP (last 3 results) No results for input(s): PROBNP in the last 8760 hours. HbA1C: No results for input(s): HGBA1C in the last 72 hours. CBG: No results for input(s): GLUCAP in the last 168 hours. Lipid Profile: No results for input(s): CHOL, HDL, LDLCALC, TRIG, CHOLHDL, LDLDIRECT in the last 72 hours. Thyroid Function Tests: No results for input(s): TSH, T4TOTAL, FREET4, T3FREE, THYROIDAB in the last 72 hours. Anemia Panel: No results for input(s): VITAMINB12, FOLATE, FERRITIN, TIBC, IRON, RETICCTPCT in the last 72 hours. Sepsis Labs: Recent Labs  Lab 02/15/21 0228 02/15/21 0408  PROCALCITON <0.10  --   LATICACIDVEN  --  1.2    Recent Results (from the past 240 hour(s))  Resp Panel by RT-PCR (Flu A&B, Covid) Nasopharyngeal Swab     Status: None   Collection Time: 02/14/21  7:43 PM   Specimen: Nasopharyngeal Swab; Nasopharyngeal(NP) swabs in vial transport medium  Result Value Ref Range Status   SARS Coronavirus 2 by RT PCR NEGATIVE NEGATIVE Final    Comment: (NOTE) SARS-CoV-2 target nucleic acids are NOT DETECTED.  The SARS-CoV-2 RNA is generally detectable in upper  respiratory specimens during the acute phase of infection. The lowest concentration of SARS-CoV-2 viral copies this assay can detect is 138 copies/mL. A negative result does not preclude SARS-Cov-2 infection and should not be used as the sole basis for treatment or other patient management decisions. A negative result may occur with  improper specimen collection/handling, submission of specimen other than nasopharyngeal swab, presence of viral mutation(s) within the areas targeted by this assay, and inadequate number of viral copies(<138 copies/mL). A negative result must be combined with clinical observations, patient history, and epidemiological information. The expected result is Negative.  Fact Sheet for Patients:  EntrepreneurPulse.com.au  Fact Sheet for Healthcare Providers:  IncredibleEmployment.be  This test is no t yet approved or cleared by the [place] FDA and  has been authorized for detection and/or diagnosis of SARS-CoV-2 by FDA under an Emergency Use Authorization (EUA). This EUA will remain  in effect (meaning this test can be used) for the duration of the COVID-19 declaration under Section 564(b)(1) of the Act, 21 U.S.C.section 360bbb-3(b)(1), unless the authorization is terminated  or revoked sooner.       Influenza A by PCR NEGATIVE  NEGATIVE Final   Influenza B by PCR NEGATIVE NEGATIVE Final    Comment: (NOTE) The Xpert Xpress SARS-CoV-2/FLU/RSV plus assay is intended as an aid in the diagnosis of influenza from Nasopharyngeal swab specimens and should not be used as a sole basis for treatment. Nasal washings and aspirates are unacceptable for Xpert Xpress SARS-CoV-2/FLU/RSV testing.  Fact Sheet for Patients: EntrepreneurPulse.com.au  Fact Sheet for Healthcare Providers: IncredibleEmployment.be  This test is not yet approved or cleared by the [place] FDA and has been  authorized for detection and/or diagnosis of SARS-CoV-2 by FDA under an Emergency Use Authorization (EUA). This EUA will remain in effect (meaning this test can be used) for the duration of the COVID-19 declaration under Section 564(b)(1) of the Act, 21 U.S.C. section 360bbb-3(b)(1), unless the authorization is terminated or revoked.  Performed at [facility], [address] [place], [place] [address]   Blood culture (single)     Status: None (Preliminary result)   Collection Time: 02/15/21  4:08 AM   Specimen: Right Antecubital; Blood  Result Value Ref Range Status   Specimen Description RIGHT ANTECUBITAL  Final   Special Requests   Final    BOTTLES DRAWN AEROBIC AND ANAEROBIC Blood Culture results may not be optimal due to an excessive volume of blood received in culture bottles   Culture   Final    NO GROWTH 1 DAY Performed at [facility], [address] [place], [place] [address]    Report Status PENDING  Incomplete  MRSA Next Gen by PCR, Nasal     Status: None   Collection Time: 02/15/21  4:40 AM   Specimen: Nasal Mucosa; Nasal Swab  Result Value Ref Range Status   MRSA by PCR Next Gen NOT DETECTED NOT DETECTED Final    Comment: (NOTE) The GeneXpert MRSA Assay (FDA approved for NASAL specimens only), is one component of a comprehensive MRSA colonization surveillance program. It is not intended to diagnose MRSA infection nor to guide or monitor treatment for MRSA infections. Test performance is not FDA approved in patients less than 31 years old. Performed at [facility], [address] [place], [place] [address]   Culture, blood (Routine X 2) w Reflex to ID Panel     Status: None (Preliminary result)   Collection Time: 02/15/21 11:45 PM   Specimen: BLOOD  Result Value Ref Range Status   Specimen Description BLOOD RIGHT WRIST  Final   Special Requests   Final    BOTTLES DRAWN AEROBIC AND ANAEROBIC Blood Culture adequate volume    Culture   Final    NO GROWTH < 12 HOURS Performed at [facility], [address], [place], [place] [address]    Report Status PENDING  Incomplete  Culture, blood (Routine X 2) w Reflex to ID Panel     Status: None (Preliminary result)   Collection Time: 02/15/21 11:45 PM   Specimen: BLOOD  Result Value Ref Range Status   Specimen Description BLOOD RIGHT HAND  Final   Special Requests IN PEDIATRIC BOTTLE Blood Culture adequate volume  Final   Culture   Final    NO GROWTH < 12 HOURS Performed at [facility], [address] [place], [place] [address]    Report Status PENDING  Incomplete         Radiology Studies: DG Chest 2 View  Result Date: 02/14/2021 CLINICAL DATA:  Shortness of breath. EXAM: CHEST - 2 VIEW COMPARISON:  February 08, 2021. FINDINGS:  Stable cardiomediastinal silhouette. Mild bibasilar subsegmental atelectasis is noted. Minimal pleural effusions may be present. Bony thorax is unremarkable. IMPRESSION: Mild bibasilar subsegmental atelectasis with minimal pleural effusions. Electronically Signed   By: Marijo Conception M.D.   On: 02/14/2021 19:09   CT Angio Chest PE W and/or Wo Contrast  Result Date: 02/15/2021 CLINICAL DATA:  Shortness of breath and labored breathing. EXAM: CT ANGIOGRAPHY CHEST WITH CONTRAST TECHNIQUE: Multidetector CT imaging of the chest was performed using the standard protocol during bolus administration of intravenous contrast. Multiplanar CT image reconstructions and MIPs were obtained to evaluate the vascular anatomy. CONTRAST:  51mL OMNIPAQUE IOHEXOL 350 MG/ML SOLN COMPARISON:  October 05, 2020 FINDINGS: Cardiovascular: There is mild calcification of the aortic arch, without evidence of aortic aneurysm. A tiny (2.8 mm) focus of intraluminal low attenuation is seen involving and anterior lower lobe branch of the left pulmonary artery (axial CT image 169, CT series 5). There is mild cardiomegaly. No pericardial effusion.  Mediastinum/Nodes: Mild pretracheal lymphadenopathy is noted. Thyroid gland, trachea, and esophagus demonstrate no significant findings. Lungs/Pleura: Moderate severity areas of scarring and/or atelectasis are seen within the bilateral apices with mild posterior left basilar atelectasis. Moderate severity infiltrate is seen within the posterolateral aspect of the right lower lobe. A very small left pleural effusion is present. No pneumothorax is identified. Upper Abdomen: Multiple surgical clips are seen within the gallbladder fossa. Musculoskeletal: No chest wall abnormality. No acute or significant osseous findings. Review of the MIP images confirms the above findings. IMPRESSION: 1. Tiny focus of low attenuation along a lower lobe branch of the left pulmonary artery which may represent a small amount of artifact. Correlation with follow-up chest CTA or nuclear medicine ventilation/perfusion scan is recommended if pulmonary embolism remains of clinical concern. 2. Moderate severity posterolateral right lower lobe infiltrate infiltrate. 3. Very small left pleural effusion. 4. Moderate severity areas of scarring and/or atelectasis are seen within the bilateral apices with mild posterior left basilar atelectasis. 5. Mild cardiomegaly. 6. Aortic atherosclerosis. Aortic Atherosclerosis (ICD10-I70.0). Electronically Signed   By: Virgina Norfolk M.D.   On: 02/15/2021 03:09   DG Chest Port 1 View  Result Date: 02/16/2021 CLINICAL DATA:  Hypoxia EXAM: PORTABLE CHEST 1 VIEW COMPARISON:  Two-view chest x-ray 02/14/2021.  CTA chest 02/15/2021. FINDINGS: Heart is enlarged. Atherosclerotic changes are noted at the arch. Asymmetric right lower lobe airspace disease remains. Chronic interstitial changes noted. IMPRESSION: 1. Persistent right lower lobe airspace disease concerning for pneumonia. 2. Cardiomegaly without failure. 3. Chronic interstitial changes. Electronically Signed   By: San Morelle M.D.   On:  02/16/2021 08:41        Scheduled Meds:  amiodarone  100 mg Oral BID   ferrous sulfate  325 mg Oral BID   furosemide  20 mg Intravenous Q12H   guaiFENesin  600 mg Oral BID   ipratropium-albuterol  3 mL Nebulization Q6H   levothyroxine  50 mcg Oral Q0600   multivitamin with minerals  1 tablet Oral Daily   Rivaroxaban  15 mg Oral Q supper   Continuous Infusions:   LOS: 0 days    Time spent: 35 minutes    Sidney Ace, MD [facility]   If 7PM-7AM, please contact night-coverage  02/16/2021, 12:01 PM

## 2021-02-16 NOTE — Plan of Care (Signed)
  Problem: Activity: Goal: Ability to tolerate increased activity will improve Outcome: Progressing   Problem: Clinical Measurements: Goal: Ability to maintain a body temperature in the normal range will improve Outcome: Progressing   Problem: Respiratory: Goal: Ability to maintain adequate ventilation will improve Outcome: Progressing Goal: Ability to maintain a clear airway will improve Outcome: Progressing   

## 2021-02-16 NOTE — Progress Notes (Signed)
OT Cancellation Note  Patient Details Name: Frances Kent MRN: [id number] DOB: Aug 16, 1930   Cancelled Treatment:    Reason Eval/Treat Not Completed: Patient not medically ready. Per chart review, pt pending NM Pulmonary Perfusion to rule out PE. OT to hold evaluation until test complete, results received, and patient cleared for exertional activity.   Fredirick Maudlin, OTR/L [place]

## 2021-02-17 DIAGNOSIS — R0600 Dyspnea, unspecified: Secondary | ICD-10-CM

## 2021-02-17 LAB — BASIC METABOLIC PANEL
Anion gap: 8 (ref 5–15)
BUN: 18 mg/dL (ref 8–23)
CO2: 25 mmol/L (ref 22–32)
Calcium: 9 mg/dL (ref 8.9–10.3)
Chloride: 102 mmol/L (ref 98–111)
Creatinine, Ser: 1.08 mg/dL — ABNORMAL HIGH (ref 0.44–1.00)
GFR, Estimated: 49 mL/min — ABNORMAL LOW (ref >60)
Glucose, Bld: 112 mg/dL — ABNORMAL HIGH (ref 70–99)
Potassium: 3.8 mmol/L (ref 3.5–5.1)
Sodium: 135 mmol/L (ref 135–145)

## 2021-02-17 LAB — CBC
HCT: 33.1 % — ABNORMAL LOW (ref 36.0–46.0)
Hemoglobin: 10 g/dL — ABNORMAL LOW (ref 12.0–15.0)
MCH: 26.4 pg (ref 26.0–34.0)
MCHC: 30.2 g/dL (ref 30.0–36.0)
MCV: 87.3 fL (ref 80.0–100.0)
Platelets: 459 10*3/uL — ABNORMAL HIGH (ref 150–400)
RBC: 3.79 MIL/uL — ABNORMAL LOW (ref 3.87–5.11)
RDW: 23.7 % — ABNORMAL HIGH (ref 11.5–15.5)
WBC: 6.7 10*3/uL (ref 4.0–10.5)
nRBC: 0 % (ref 0.0–0.2)

## 2021-02-17 LAB — MAGNESIUM: Magnesium: 2.2 mg/dL (ref 1.7–2.4)

## 2021-02-17 MED ORDER — IPRATROPIUM-ALBUTEROL 0.5-2.5 (3) MG/3ML IN SOLN
3.0000 mL | RESPIRATORY_TRACT | Status: DC | PRN
  Administered 2021-02-17 – 2021-02-18 (×2): 3 mL via RESPIRATORY_TRACT
  Filled 2021-02-17 (×2): qty 3

## 2021-02-17 MED ORDER — ENSURE ENLIVE PO LIQD: 237.0000 mL | Freq: Three times a day (TID) | ORAL | Status: DC

## 2021-02-17 NOTE — Progress Notes (Signed)
Breathing treatment held due to patient stating that the nebs make her more SOB, will do an RT Protocol assessment to see if her nebs can be made PRN.

## 2021-02-17 NOTE — TOC Progression Note (Signed)
Transition of Care [facility]) - Progression Note    Patient Details  Name: Frances Kent MRN: [id number] Date of Birth: [birth year]  Transition of [facility] CM/SW Contact  Beverly Sessions, RN Phone Number: 02/17/2021, 8:48 AM  Clinical Narrative:    PT and OT recommending home heath no DME.  Will plan for resumption of care through [facility] at discharge    Expected Discharge Plan: [place] Barriers to Discharge: Continued Medical Work up  Expected Discharge Plan and Services Expected Discharge Plan: [place]: [place] Date [place]: 02/16/21   Representative spoke with at [place]: [place] ([place]) Interventions    Readmission Risk Interventions Readmission Risk Prevention Plan 02/16/2021 01/14/2021 11/01/2020  Transportation Screening Complete Complete Complete  PCP or Specialist Appt within 5-7 Days - - Complete  Medication Review (RN CM) - - Complete  Medication Review Press photographer) Complete Complete -  PCP or Specialist appointment within 3-5 days of discharge - Complete -  [place] or Home Care Consult Complete Complete -  SW Recovery Care/Counseling Consult Complete Complete -  Palliative Care Screening Not Applicable Not Applicable -  [place] - Not Applicable -  Some recent data might be hidden

## 2021-02-17 NOTE — Progress Notes (Signed)
PROGRESS NOTE    Frances Kent  XTG:[id number] DOB: 10/22/1930 DOA: 02/15/2021 PCP: [name], MD  [phone]   Assessment & Plan:   Principal Problem:   Pneumonia Active Problems:   CAP (community acquired pneumonia)   Acute decompensated heart failure ([place])    88 year old Caucasian female history significant for hypertension, hyperlipidemia, CVA, GERD, hypothyroid, depression, interstitial lung disease, atrial fibrillation/PE on Xarelto, mitral valve prolapse, CH F with EF 40 to 45% recently admitted and treated for atypical pneumonia on 12/20, discharged on 12/28.  Doing well until yesterday when patient started having worsening dyspnea with associated tachypnea.   Unclear whether this represents a true infection versus exacerbation of interstitial lung disease versus possible small PE.  Mild exacerbation of CHF remains on differential though BNP appears to be at baseline.  PE felt unlikely given reported adherence to home anticoagulant regimen.   Acute hypoxic respiratory failure, ruled out --no documented hypoxia.  Dyspnea  --already treated for PNA with a course of broad spectrum abx recently, not strong evidence of active bacterial PNA.  No obvious pulm edema or fluid overload.  Pt has chronic interstitial changes, which combined with recent PNA, may be causing pt's dyspnea. Plan: --consider pulm consult tomorrow   chronic systolic congestive heart failure Per patient's daughter patient has not been taking Lasix daily Has not taken several days before admission due to lack of weight gain and no swelling of lower extremities BNP elevated, but improved from prior.  No strong evidence of acute exacerbation. Plan: --d/c IV lasix   PE, ruled out Mild possible filling defect noted on CT Unlikely given adherence to Xarelto VQ scan ruled out PE   Atrial fibrillation, paroxysmal Rate controlled --cont home amiodarone --cont home Xarelto   Hypothyroidism PTA  Synthroid   Anxiety --cont home Xanax PRN   DVT prophylaxis: HW:[id number]  Code Status: Limited code  Family Communication: daughter updated at bedside today  Level of care: Med-Surg Dispo:   The patient is from: home Anticipated d/c is to: home Anticipated d/c date is: 1-2 days Patient currently is not medically ready to d/c due to: pending pulm consult   Subjective and Interval History:  Pt continued to have intermittent dyspnea, but no hypoxia.  No cough.  Normal oral intake.   Objective: Vitals:   02/17/21 0744 02/17/21 0753 02/17/21 1530 02/17/21 2022  BP:  (!) 115/48 (!) 90/44 135/60  Pulse:  73 (!) 55 64  Resp:  18 18 20   Temp:  98.1 F (36.7 C) 98.3 F (36.8 C) 97.6 F (36.4 C)  TempSrc:  Oral  Oral  SpO2: 99% 98% 99% 100%  Weight:      Height:        Intake/Output Summary (Last 24 hours) at 02/17/2021 2338 Last data filed at 02/17/2021 1852 Gross per 24 hour  Intake 1080 ml  Output 1200 ml  Net -120 ml   Filed Weights   02/14/21 1824  Weight: 45.4 kg    Examination:   Constitutional: NAD, AAOx3 HEENT: conjunctivae and lids normal, EOMI CV: No cyanosis.   RESP: reduced air movement, no wheezes or crackles, on RA Neuro: II - XII grossly intact.   Psych: Normal mood and affect.  Appropriate judgement and reason   Data Reviewed: I have personally reviewed following labs and imaging studies  CBC: Recent Labs  Lab 02/14/21 1832 02/15/21 0500 02/17/21 1104  WBC 8.4 8.6 6.7  NEUTROABS 5.5  --   --   HGB  10.2* 9.6* 10.0*  HCT 33.5* 31.7* 33.1*  MCV 87.0 85.9 87.3  PLT 492* 447* 169*   Basic Metabolic Panel: Recent Labs  Lab 02/14/21 1832 02/15/21 0500 02/16/21 0408 02/17/21 1104  NA 138 139  --  135  K 4.2 4.1  --  3.8  CL 108 109  --  102  CO2 25 24  --  25  GLUCOSE 96 90  --  112*  BUN 16 13  --  18  CREATININE 0.95 0.99 1.03* 1.08*  CALCIUM 8.9 8.7*  --  9.0  MG  --   --   --  2.2   GFR: Estimated Creatinine Clearance: 25.8  mL/min (A) (by C-G formula based on SCr of 1.08 mg/dL (H)). Liver Function Tests: Recent Labs  Lab 02/14/21 1832  AST 27  ALT 25  ALKPHOS 100  BILITOT 0.6  PROT 6.5  ALBUMIN 2.9*   No results for input(s): LIPASE, AMYLASE in the last 168 hours. No results for input(s): AMMONIA in the last 168 hours. Coagulation Profile: No results for input(s): INR, PROTIME in the last 168 hours. Cardiac Enzymes: No results for input(s): CKTOTAL, CKMB, CKMBINDEX, TROPONINI in the last 168 hours. BNP (last 3 results) No results for input(s): PROBNP in the last 8760 hours. HbA1C: No results for input(s): HGBA1C in the last 72 hours. CBG: No results for input(s): GLUCAP in the last 168 hours. Lipid Profile: No results for input(s): CHOL, HDL, LDLCALC, TRIG, CHOLHDL, LDLDIRECT in the last 72 hours. Thyroid Function Tests: No results for input(s): TSH, T4TOTAL, FREET4, T3FREE, THYROIDAB in the last 72 hours. Anemia Panel: No results for input(s): VITAMINB12, FOLATE, FERRITIN, TIBC, IRON, RETICCTPCT in the last 72 hours. Sepsis Labs: Recent Labs  Lab 02/15/21 0228 02/15/21 0408  PROCALCITON <0.10  --   LATICACIDVEN  --  1.2    Recent Results (from the past 240 hour(s))  Resp Panel by RT-PCR (Flu A&B, Covid) Nasopharyngeal Swab     Status: None   Collection Time: 02/14/21  7:43 PM   Specimen: Nasopharyngeal Swab; Nasopharyngeal(NP) swabs in vial transport medium  Result Value Ref Range Status   SARS Coronavirus 2 by RT PCR NEGATIVE NEGATIVE Final    Comment: (NOTE) SARS-CoV-2 target nucleic acids are NOT DETECTED.  The SARS-CoV-2 RNA is generally detectable in upper respiratory specimens during the acute phase of infection. The lowest concentration of SARS-CoV-2 viral copies this assay can detect is 138 copies/mL. A negative result does not preclude SARS-Cov-2 infection and should not be used as the sole basis for treatment or other patient management decisions. A negative result may  occur with  improper specimen collection/handling, submission of specimen other than nasopharyngeal swab, presence of viral mutation(s) within the areas targeted by this assay, and inadequate number of viral copies(<138 copies/mL). A negative result must be combined with clinical observations, patient history, and epidemiological information. The expected result is Negative.  Fact Sheet for Patients:  EntrepreneurPulse.com.au  Fact Sheet for Healthcare Providers:  IncredibleEmployment.be  This test is no t yet approved or cleared by the [place] FDA and  has been authorized for detection and/or diagnosis of SARS-CoV-2 by FDA under an Emergency Use Authorization (EUA). This EUA will remain  in effect (meaning this test can be used) for the duration of the COVID-19 declaration under Section 564(b)(1) of the Act, 21 U.S.C.section 360bbb-3(b)(1), unless the authorization is terminated  or revoked sooner.       Influenza A by PCR NEGATIVE NEGATIVE Final  Influenza B by PCR NEGATIVE NEGATIVE Final    Comment: (NOTE) The Xpert Xpress SARS-CoV-2/FLU/RSV plus assay is intended as an aid in the diagnosis of influenza from Nasopharyngeal swab specimens and should not be used as a sole basis for treatment. Nasal washings and aspirates are unacceptable for Xpert Xpress SARS-CoV-2/FLU/RSV testing.  Fact Sheet for Patients: EntrepreneurPulse.com.au  Fact Sheet for Healthcare Providers: IncredibleEmployment.be  This test is not yet approved or cleared by the [place] FDA and has been authorized for detection and/or diagnosis of SARS-CoV-2 by FDA under an Emergency Use Authorization (EUA). This EUA will remain in effect (meaning this test can be used) for the duration of the COVID-19 declaration under Section 564(b)(1) of the Act, 21 U.S.C. section 360bbb-3(b)(1), unless the authorization is terminated  or revoked.  Performed at [facility], [address] [place], [place] [address]   Blood culture (single)     Status: None (Preliminary result)   Collection Time: 02/15/21  4:08 AM   Specimen: Right Antecubital; Blood  Result Value Ref Range Status   Specimen Description RIGHT ANTECUBITAL  Final   Special Requests   Final    BOTTLES DRAWN AEROBIC AND ANAEROBIC Blood Culture results may not be optimal due to an excessive volume of blood received in culture bottles   Culture   Final    NO GROWTH 2 DAYS Performed at [facility], [address] [place], [place] [address]    Report Status PENDING  Incomplete  MRSA Next Gen by PCR, Nasal     Status: None   Collection Time: 02/15/21  4:40 AM   Specimen: Nasal Mucosa; Nasal Swab  Result Value Ref Range Status   MRSA by PCR Next Gen NOT DETECTED NOT DETECTED Final    Comment: (NOTE) The GeneXpert MRSA Assay (FDA approved for NASAL specimens only), is one component of a comprehensive MRSA colonization surveillance program. It is not intended to diagnose MRSA infection nor to guide or monitor treatment for MRSA infections. Test performance is not FDA approved in patients less than 29 years old. Performed at [facility], [address] [place], [place] [address]   Culture, blood (Routine X 2) w Reflex to ID Panel     Status: None (Preliminary result)   Collection Time: 02/15/21 11:45 PM   Specimen: BLOOD  Result Value Ref Range Status   Specimen Description BLOOD RIGHT WRIST  Final   Special Requests   Final    BOTTLES DRAWN AEROBIC AND ANAEROBIC Blood Culture adequate volume   Culture   Final    NO GROWTH 1 DAY Performed at [facility], [address] [place], [place] [address]    Report Status PENDING  Incomplete  Culture, blood (Routine X 2) w Reflex to ID Panel     Status: None (Preliminary result)   Collection Time: 02/15/21 11:45 PM   Specimen: BLOOD  Result Value Ref Range  Status   Specimen Description BLOOD RIGHT HAND  Final   Special Requests IN PEDIATRIC BOTTLE Blood Culture adequate volume  Final   Culture   Final    NO GROWTH 1 DAY Performed at [facility], [address], [place], [place] [address]    Report Status PENDING  Incomplete      Radiology Studies: NM Pulmonary Perfusion  Result Date: 02/16/2021 CLINICAL DATA:  PE suspected EXAM: NUCLEAR MEDICINE PERFUSION LUNG SCAN TECHNIQUE: Perfusion images were obtained in multiple projections after intravenous injection of radiopharmaceutical. Ventilation scans intentionally  deferred if perfusion scan and chest x-ray adequate for interpretation during COVID 19 epidemic. RADIOPHARMACEUTICALS:  4.5 mCi Tc-68m MAA IV COMPARISON:  Same day chest radiograph, CT angiogram 02/15/2021 FINDINGS: Normal, homogeneous pulmonary perfusion. No suspicious perfusion defects. Cardiomegaly. IMPRESSION: Very low probability examination for pulmonary embolism by modified perfusion only PIOPED criteria (PE absent). Electronically Signed   By: Delanna Ahmadi M.D.   On: 02/16/2021 13:11   DG Chest Port 1 View  Result Date: 02/16/2021 CLINICAL DATA:  Hypoxia EXAM: PORTABLE CHEST 1 VIEW COMPARISON:  Two-view chest x-ray 02/14/2021.  CTA chest 02/15/2021. FINDINGS: Heart is enlarged. Atherosclerotic changes are noted at the arch. Asymmetric right lower lobe airspace disease remains. Chronic interstitial changes noted. IMPRESSION: 1. Persistent right lower lobe airspace disease concerning for pneumonia. 2. Cardiomegaly without failure. 3. Chronic interstitial changes. Electronically Signed   By: San Morelle M.D.   On: 02/16/2021 08:41     Scheduled Meds:  amiodarone  100 mg Oral BID   feeding supplement  237 mL Oral TID BM   ferrous sulfate  325 mg Oral BID   guaiFENesin  600 mg Oral BID   levothyroxine  50 mcg Oral Q0600   multivitamin with minerals  1 tablet Oral Daily   Rivaroxaban  15 mg Oral Q supper    Continuous Infusions:   LOS: 1 day     Enzo Bi, MD [facility] If 7PM-7AM, please contact night-coverage 02/17/2021, 11:38 PM

## 2021-02-17 NOTE — Progress Notes (Signed)
Occupational Therapy Treatment Patient Details Name: Frances Kent MRN: [id number] DOB: 1930-08-20 Today's Date: 02/17/2021   History of present illness 88 y.o. female with medical history significant for  hypertension, hyperlipidemia, stroke, GERD, hypothyroidism, depression with anxiety, interstitial lung disease, anemia, SSS, SVT, PE and atrial fibrillation on Xarelto, mitral valve prolapse, CHF with EF of 40-45%, bilateral carotid artery stenosis, who was recently admitted for atypical pneumonia on 12/20 and discharged on 12/28.  She has been doing fairly well until 1/1 when she started having worsening dyspnea with associated tachypnea.   OT comments  Upon arrival pt. presented with the O2 tube pulled down at shin level. Pt. reported that she was trying to see if she could breathe on her own. Pt. was assisted with repositioning to O2 back in the nasal position. Pt.'s SO2 was 98%, BP 113/45 at bed level. Pt. reports 8/10 back pain. Pt. education was provided about body mechanics, positioning, work simplification strategies, A/E, and DME for back pain. Reviewed pt.'s daily routines, and energy conservation techniques for ADL, and IADL functioning. Pt. continues to benefit from OT services for ADL training, A/E training, and pt. education about  body mechanics, positioning, energy conservation, work simplification, home modification, and DME. Pt. Plans to return home upon discharge with family to assist pt. as needed. Pt. Continues to appropriate for follow-up HHOT services upon discharge.    Recommendations for follow up therapy are one component of a multi-disciplinary discharge planning process, led by the attending physician.  Recommendations may be updated based on patient status, additional functional criteria and insurance authorization.    Follow Up Recommendations  Home health OT    Assistance Recommended at Discharge Frequent or constant Supervision/Assistance  Patient can return home  with the following  A little help with walking and/or transfers;Assistance with cooking/housework;A little help with bathing/dressing/bathroom   Equipment Recommendations  None recommended by OT    Recommendations for Other Services      Precautions / Restrictions Precautions Precautions: Fall Restrictions Weight Bearing Restrictions: No       Mobility Bed Mobility Overal bed mobility: Needs Assistance Bed Mobility: Supine to Sit     Supine to sit: Supervision;HOB elevated Sit to supine: Supervision;HOB elevated        Transfers Overall transfer level: Needs assistance Equipment used: Rolling walker (2 wheels) Transfers: Sit to/from Stand Sit to Stand: Supervision           General transfer comment: Per chart     Balance                                           ADL either performed or assessed with clinical judgement   ADL                                         General ADL Comments: Pt. education was provded about body mechanics, A/E, DM secondary to 8/10 persistent back pain, and reviewed energy conservation/work simplifciation techniques, and pursed lip breathing techniques.    Extremity/Trunk Assessment              Vision       Perception     Praxis      Cognition Arousal/Alertness: Awake/alert Behavior During Therapy: WFL for tasks assessed/performed Overall Cognitive Status: Within  Functional Limits for tasks assessed                                 General Comments: Patient reported increased tired feeling- alert and appropriate with all communication          Exercises     Shoulder Instructions       General Comments      Pertinent Vitals/ Pain       Pain Assessment: 0-10 Pain Score: 8  Pain Location: Back pain Pain Descriptors / Indicators: Aching;Sore Pain Intervention(s): Limited activity within patient's tolerance;Monitored during session;Repositioned  Home Living                                           Prior Functioning/Environment              Frequency  Min 2X/week        Progress Toward Goals  OT Goals(current goals can now be found in the care plan section)  Progress towards OT goals: Progressing toward goals  Acute Rehab OT Goals Patient Stated Goal: To go home OT Goal Formulation: With patient Time For Goal Achievement: 03/02/21 Potential to Achieve Goals: Good  Plan Discharge plan remains appropriate;Frequency remains appropriate    Co-evaluation                 AM-PAC OT "6 Clicks" Daily Activity     Outcome Measure   Help from another person eating meals?: None Help from another person taking care of personal grooming?: A Little Help from another person toileting, which includes using toliet, bedpan, or urinal?: A Little Help from another person bathing (including washing, rinsing, drying)?: A Little Help from another person to put on and taking off regular upper body clothing?: A Little Help from another person to put on and taking off regular lower body clothing?: A Little 6 Click Score: 19    End of Session    OT Visit Diagnosis: Muscle weakness (generalized) (M62.81)   Activity Tolerance Patient tolerated treatment well   Patient Left in chair;with call bell/phone within reach;with chair alarm set   Nurse Communication Mobility status;Other (comment)        Time: 4492-0100 OT Time Calculation (min): 28 min  Charges: OT General Charges $OT Visit: 1 Visit OT Treatments $Self Care/Home Management : 23-37 mins  Harrel Carina, MS, OTR/L   Harrel Carina 02/17/2021, 10:11 AM

## 2021-02-18 DIAGNOSIS — R0602 Shortness of breath: Secondary | ICD-10-CM

## 2021-02-18 DIAGNOSIS — R0603 Acute respiratory distress: Secondary | ICD-10-CM

## 2021-02-18 LAB — BASIC METABOLIC PANEL
Anion gap: 7 (ref 5–15)
BUN: 17 mg/dL (ref 8–23)
CO2: 25 mmol/L (ref 22–32)
Calcium: 8.7 mg/dL — ABNORMAL LOW (ref 8.9–10.3)
Chloride: 107 mmol/L (ref 98–111)
Creatinine, Ser: 0.94 mg/dL (ref 0.44–1.00)
GFR, Estimated: 58 mL/min — ABNORMAL LOW (ref >60)
Glucose, Bld: 83 mg/dL (ref 70–99)
Potassium: 3.8 mmol/L (ref 3.5–5.1)
Sodium: 139 mmol/L (ref 135–145)

## 2021-02-18 LAB — CBC
HCT: 30.4 % — ABNORMAL LOW (ref 36.0–46.0)
Hemoglobin: 9.6 g/dL — ABNORMAL LOW (ref 12.0–15.0)
MCH: 26.8 pg (ref 26.0–34.0)
MCHC: 31.6 g/dL (ref 30.0–36.0)
MCV: 84.9 fL (ref 80.0–100.0)
Platelets: 433 10*3/uL — ABNORMAL HIGH (ref 150–400)
RBC: 3.58 MIL/uL — ABNORMAL LOW (ref 3.87–5.11)
RDW: 23.3 % — ABNORMAL HIGH (ref 11.5–15.5)
WBC: 5.2 10*3/uL (ref 4.0–10.5)
nRBC: 0 % (ref 0.0–0.2)

## 2021-02-18 LAB — MAGNESIUM: Magnesium: 2.2 mg/dL (ref 1.7–2.4)

## 2021-02-18 MED ORDER — FUROSEMIDE 10 MG/ML IJ SOLN
40.0000 mg | Freq: Once | INTRAMUSCULAR | Status: AC
  Administered 2021-02-18: 40 mg via INTRAVENOUS
  Filled 2021-02-18: qty 4

## 2021-02-18 MED ORDER — BUDESONIDE 0.25 MG/2ML IN SUSP
0.2500 mg | Freq: Two times a day (BID) | RESPIRATORY_TRACT | Status: DC
  Administered 2021-02-18 – 2021-02-20 (×4): 0.25 mg via RESPIRATORY_TRACT
  Filled 2021-02-18 (×4): qty 2

## 2021-02-18 MED ORDER — LEVALBUTEROL HCL 0.63 MG/3ML IN NEBU
0.6300 mg | INHALATION_SOLUTION | Freq: Three times a day (TID) | RESPIRATORY_TRACT | Status: DC
  Administered 2021-02-18: 0.63 mg via RESPIRATORY_TRACT
  Filled 2021-02-18 (×2): qty 3

## 2021-02-18 MED ORDER — PANTOPRAZOLE SODIUM 20 MG PO TBEC
20.0000 mg | DELAYED_RELEASE_TABLET | Freq: Two times a day (BID) | ORAL | Status: DC
  Administered 2021-02-19 – 2021-02-20 (×3): 20 mg via ORAL
  Filled 2021-02-18 (×4): qty 1

## 2021-02-18 MED ORDER — LEVALBUTEROL HCL 0.63 MG/3ML IN NEBU
0.6300 mg | INHALATION_SOLUTION | Freq: Three times a day (TID) | RESPIRATORY_TRACT | Status: DC
  Administered 2021-02-19 – 2021-02-20 (×4): 0.63 mg via RESPIRATORY_TRACT
  Filled 2021-02-18 (×6): qty 3

## 2021-02-18 MED ORDER — ALPRAZOLAM 0.25 MG PO TABS
0.2500 mg | ORAL_TABLET | Freq: Two times a day (BID) | ORAL | Status: DC | PRN
  Administered 2021-02-18 – 2021-02-19 (×2): 0.25 mg via ORAL
  Filled 2021-02-18 (×2): qty 1

## 2021-02-18 NOTE — Progress Notes (Signed)
Patient seen and examined.  Full consult note to follow.  Discussed with Dr. Billie Ruddy.  Concern for aspiration as episodes of breathlessness occur associated with meals and occur suddenly.  Also patient with pulmonary infiltrate consistent with aspiration.  Recommend MBS/speech eval (ordered)  Other potential etiology: Query amiodarone toxicity.  Checking ESR/CRP.  Checking CBC with differential.  Low-dose level albuterol and Pulmicort to see if helps with breathlessness.  Agree with diuretic therapy already instituted  See orders for details.   Frances Don, MD Advanced Bronchoscopy PCCM  [facility]    *This note was dictated using voice recognition software/Dragon.  Despite best efforts to proofread, errors can occur which can change the meaning. Any transcriptional errors that result from this process are unintentional and may not be fully corrected at the time of dictation.

## 2021-02-18 NOTE — Progress Notes (Signed)
PROGRESS NOTE    Frances Kent  LPF:[id number] DOB: May 04, 1930 DOA: 02/15/2021 PCP: [name], MD  [phone]   Assessment & Plan:   Principal Problem:   Pneumonia Active Problems:   CAP (community acquired pneumonia)   Acute decompensated heart failure ([place])    88 year old Caucasian female history significant for hypertension, hyperlipidemia, CVA, GERD, hypothyroid, depression, interstitial lung disease, atrial fibrillation/PE on Xarelto, mitral valve prolapse, CH F with EF 40 to 45% recently admitted and treated for atypical pneumonia on 12/20, discharged on 12/28.  Doing well until yesterday when patient started having worsening dyspnea with associated tachypnea.   Unclear whether this represents a true infection versus exacerbation of interstitial lung disease versus possible small PE.  Mild exacerbation of CHF remains on differential though BNP appears to be at baseline.  PE felt unlikely given reported adherence to home anticoagulant regimen.   Acute hypoxic respiratory failure, ruled out --no documented hypoxia.  Dyspnea  --already treated for PNA with a course of broad spectrum abx recently, not strong evidence of active bacterial PNA.  No obvious pulm edema or fluid overload.  Pt has chronic interstitial changes, which combined with recent PNA, may be causing pt's dyspnea.  Daughter noted today dyspnea happened after eating a meal Plan: --pulm consult today --SLP eval   chronic systolic congestive heart failure Mitral regurg Per patient's daughter patient has not been taking Lasix daily Has not taken several days before admission due to lack of weight gain and no swelling of lower extremities BNP elevated, but improved from prior.  No strong evidence of acute exacerbation. Plan: --trial IV lasix 40 x1 today   PE, ruled out Mild possible filling defect noted on CT Unlikely given adherence to Xarelto VQ scan ruled out PE   Atrial fibrillation,  paroxysmal Rate controlled --cont home amiodarone --cont home Xarelto   Hypothyroidism --cont home Synthroid   Anxiety --increase Xanax to BID PRN, due to increase anxiety associated with respiratory distress   DVT prophylaxis: [id number]  Code Status: Limited code  Family Communication: daughter updated at bedside today  Level of care: Med-Surg Dispo:   The patient is from: home Anticipated d/c is to: home Anticipated d/c date is: 1-2 days Patient currently is not medically ready to d/c due to: pending pulm consult and SLP   Subjective and Interval History:  Daughter noted pt having respiratory distress after eating a meal.  Pt was not aware of any problem with swallowing or aspirating.  Pt repeatedly said she couldn't go home not being able to breath.  Pulm consult today.   Objective: Vitals:   02/17/21 2022 02/18/21 0423 02/18/21 0734 02/18/21 1519  BP: 135/60 (!) 111/50 (!) 118/46 (!) 103/46  Pulse: 64 67 70 (!) 57  Resp: 20 20 18 17   Temp: 97.6 F (36.4 C) 97.6 F (36.4 C) 98 F (36.7 C) 97.9 F (36.6 C)  TempSrc: Oral Oral Oral Oral  SpO2: 100% 100% 95% 100%  Weight:      Height:        Intake/Output Summary (Last 24 hours) at 02/18/2021 1749 Last data filed at 02/18/2021 0500 Gross per 24 hour  Intake 300 ml  Output 50 ml  Net 250 ml   Filed Weights   02/14/21 1824  Weight: 45.4 kg    Examination:   Constitutional: NAD, AAOx3 HEENT: conjunctivae and lids normal, EOMI CV: No cyanosis.   RESP: normal respiratory effort, on 2L Neuro: II - XII grossly intact.  Data Reviewed: I have personally reviewed following labs and imaging studies  CBC: Recent Labs  Lab 02/14/21 1832 02/15/21 0500 02/17/21 1104 02/18/21 0412  WBC 8.4 8.6 6.7 5.2  NEUTROABS 5.5  --   --   --   HGB 10.2* 9.6* 10.0* 9.6*  HCT 33.5* 31.7* 33.1* 30.4*  MCV 87.0 85.9 87.3 84.9  PLT 492* 447* 459* 161*   Basic Metabolic Panel: Recent Labs  Lab 02/14/21 1832  02/15/21 0500 02/16/21 0408 02/17/21 1104 02/18/21 0412  NA 138 139  --  135 139  K 4.2 4.1  --  3.8 3.8  CL 108 109  --  102 107  CO2 25 24  --  25 25  GLUCOSE 96 90  --  112* 83  BUN 16 13  --  18 17  CREATININE 0.95 0.99 1.03* 1.08* 0.94  CALCIUM 8.9 8.7*  --  9.0 8.7*  MG  --   --   --  2.2 2.2   GFR: Estimated Creatinine Clearance: 29.6 mL/min (by C-G formula based on SCr of 0.94 mg/dL). Liver Function Tests: Recent Labs  Lab 02/14/21 1832  AST 27  ALT 25  ALKPHOS 100  BILITOT 0.6  PROT 6.5  ALBUMIN 2.9*   No results for input(s): LIPASE, AMYLASE in the last 168 hours. No results for input(s): AMMONIA in the last 168 hours. Coagulation Profile: No results for input(s): INR, PROTIME in the last 168 hours. Cardiac Enzymes: No results for input(s): CKTOTAL, CKMB, CKMBINDEX, TROPONINI in the last 168 hours. BNP (last 3 results) No results for input(s): PROBNP in the last 8760 hours. HbA1C: No results for input(s): HGBA1C in the last 72 hours. CBG: No results for input(s): GLUCAP in the last 168 hours. Lipid Profile: No results for input(s): CHOL, HDL, LDLCALC, TRIG, CHOLHDL, LDLDIRECT in the last 72 hours. Thyroid Function Tests: No results for input(s): TSH, T4TOTAL, FREET4, T3FREE, THYROIDAB in the last 72 hours. Anemia Panel: No results for input(s): VITAMINB12, FOLATE, FERRITIN, TIBC, IRON, RETICCTPCT in the last 72 hours. Sepsis Labs: Recent Labs  Lab 02/15/21 0228 02/15/21 0408  PROCALCITON <0.10  --   LATICACIDVEN  --  1.2    Recent Results (from the past 240 hour(s))  Resp Panel by RT-PCR (Flu A&B, Covid) Nasopharyngeal Swab     Status: None   Collection Time: 02/14/21  7:43 PM   Specimen: Nasopharyngeal Swab; Nasopharyngeal(NP) swabs in vial transport medium  Result Value Ref Range Status   SARS Coronavirus 2 by RT PCR NEGATIVE NEGATIVE Final    Comment: (NOTE) SARS-CoV-2 target nucleic acids are NOT DETECTED.  The SARS-CoV-2 RNA is generally  detectable in upper respiratory specimens during the acute phase of infection. The lowest concentration of SARS-CoV-2 viral copies this assay can detect is 138 copies/mL. A negative result does not preclude SARS-Cov-2 infection and should not be used as the sole basis for treatment or other patient management decisions. A negative result may occur with  improper specimen collection/handling, submission of specimen other than nasopharyngeal swab, presence of viral mutation(s) within the areas targeted by this assay, and inadequate number of viral copies(<138 copies/mL). A negative result must be combined with clinical observations, patient history, and epidemiological information. The expected result is Negative.  Fact Sheet for Patients:  EntrepreneurPulse.com.au  Fact Sheet for Healthcare Providers:  IncredibleEmployment.be  This test is no t yet approved or cleared by the [place] FDA and  has been authorized for detection and/or diagnosis of SARS-CoV-2 by  FDA under an Emergency Use Authorization (EUA). This EUA will remain  in effect (meaning this test can be used) for the duration of the COVID-19 declaration under Section 564(b)(1) of the Act, 21 U.S.C.section 360bbb-3(b)(1), unless the authorization is terminated  or revoked sooner.       Influenza A by PCR NEGATIVE NEGATIVE Final   Influenza B by PCR NEGATIVE NEGATIVE Final    Comment: (NOTE) The Xpert Xpress SARS-CoV-2/FLU/RSV plus assay is intended as an aid in the diagnosis of influenza from Nasopharyngeal swab specimens and should not be used as a sole basis for treatment. Nasal washings and aspirates are unacceptable for Xpert Xpress SARS-CoV-2/FLU/RSV testing.  Fact Sheet for Patients: EntrepreneurPulse.com.au  Fact Sheet for Healthcare Providers: IncredibleEmployment.be  This test is not yet approved or cleared by the [place] FDA  and has been authorized for detection and/or diagnosis of SARS-CoV-2 by FDA under an Emergency Use Authorization (EUA). This EUA will remain in effect (meaning this test can be used) for the duration of the COVID-19 declaration under Section 564(b)(1) of the Act, 21 U.S.C. section 360bbb-3(b)(1), unless the authorization is terminated or revoked.  Performed at [facility], [address] [place], [place] [address]   Blood culture (single)     Status: None (Preliminary result)   Collection Time: 02/15/21  4:08 AM   Specimen: Right Antecubital; Blood  Result Value Ref Range Status   Specimen Description RIGHT ANTECUBITAL  Final   Special Requests   Final    BOTTLES DRAWN AEROBIC AND ANAEROBIC Blood Culture results may not be optimal due to an excessive volume of blood received in culture bottles   Culture   Final    NO GROWTH 3 DAYS Performed at [facility], [address], [place], [place] [address]    Report Status PENDING  Incomplete  MRSA Next Gen by PCR, Nasal     Status: None   Collection Time: 02/15/21  4:40 AM   Specimen: Nasal Mucosa; Nasal Swab  Result Value Ref Range Status   MRSA by PCR Next Gen NOT DETECTED NOT DETECTED Final    Comment: (NOTE) The GeneXpert MRSA Assay (FDA approved for NASAL specimens only), is one component of a comprehensive MRSA colonization surveillance program. It is not intended to diagnose MRSA infection nor to guide or monitor treatment for MRSA infections. Test performance is not FDA approved in patients less than 53 years old. Performed at [facility], [address] [place], [place] [address]   Culture, blood (Routine X 2) w Reflex to ID Panel     Status: None (Preliminary result)   Collection Time: 02/15/21 11:45 PM   Specimen: BLOOD  Result Value Ref Range Status   Specimen Description BLOOD RIGHT WRIST  Final   Special Requests   Final    BOTTLES DRAWN AEROBIC AND ANAEROBIC Blood Culture adequate  volume   Culture   Final    NO GROWTH 2 DAYS Performed at [facility], [address], [place], [place] [address]    Report Status PENDING  Incomplete  Culture, blood (Routine X 2) w Reflex to ID Panel     Status: None (Preliminary result)   Collection Time: 02/15/21 11:45 PM   Specimen: BLOOD  Result Value Ref Range Status   Specimen Description BLOOD RIGHT HAND  Final   Special Requests IN PEDIATRIC BOTTLE Blood Culture adequate volume  Final   Culture   Final    NO GROWTH 2 DAYS Performed  at [facility], [address], [place], [place] [address]    Report Status PENDING  Incomplete      Radiology Studies: No results found.   Scheduled Meds:  amiodarone  100 mg Oral BID   budesonide (PULMICORT) nebulizer solution  0.25 mg Nebulization BID   feeding supplement  237 mL Oral TID BM   ferrous sulfate  325 mg Oral BID   guaiFENesin  600 mg Oral BID   levalbuterol  0.63 mg Nebulization Q8H   levothyroxine  50 mcg Oral Q0600   multivitamin with minerals  1 tablet Oral Daily   [START ON 02/19/2021] pantoprazole  20 mg Oral BID AC   Rivaroxaban  15 mg Oral Q supper   Continuous Infusions:   LOS: 2 days     Enzo Bi, MD [facility] If 7PM-7AM, please contact night-coverage 02/18/2021, 5:49 PM

## 2021-02-18 NOTE — Consult Note (Signed)
NAME:  Frances Kent, MRN:  [id number], DOB:  Jan 21, 1931, LOS: 2 ADMISSION DATE:  02/15/2021, CONSULTATION DATE: 02/18/2021 REFERRING MD: Enzo Bi, MD, CHIEF COMPLAINT: Sudden episodes of shortness of breath  History of Present Illness:  Patient is an 88 year old lifelong never smoker who was admitted to [facility] on 15 February 2021 with a complaint of shortness of breath.  She was recently admitted to [facility] for pneumonia in 02 February 2021 through 10 February 2021.  Infiltrate on the right lower lobe.  She apparently had been doing well until the day prior to this current admission when she started having dyspnea and tachypnea which were sudden in onset and episodic during the day.  We are asked to evaluate her for the these issues.  The patient tells me today that these episodes occur after meals.  She just becomes very breathless and anxious.  We are also asked to render opinion on her current CT scan.  The patient has not endorsed any fevers, chills or sweats.  No chest pain per se but does noted heaviness particularly after meals when these episodes of breathlessness occur.  Breathlessness is relieved by oxygen and anxiolytic.  She has received DuoNebs during these episodes but feels that these are not helpful.  Per RT the patient actually requested that the nebulizers be made as needed as she becomes more short of breath with them.  The patient does not endorse any dysphagia she is not aware of reflux however states that she has a hiatal hernia.  She has not had any lower extremity edema.  She has an extensive history of paroxysmal atrial fibrillation and has been on amiodarone and Xarelto.  Left ventricular ejection fraction is 40 to 45% she has severe MR.  She has had multiple hospitalizations over the last 4 months with issues related to her cardiac diagnoses.   Pertinent  Medical History  Assessment atrial fibrillation paroxysmal on amiodarone Severe mitral valve regurgitation Moderate  tricuspid regurgitation  Significant Hospital Events: Including procedures, antibiotic start and stop dates in addition to other pertinent events   02/15/2021 CT angio chest persistent right lower lobe infiltrate mild residual scarring/atelectasis bilateral apices (this is not pulmonary fibrosis nor interstitial lung disease, nonspecific finding) mild cardiomegaly. 02/16/2021 chest x-ray: Persistent right lower lobe airspace disease , cardiomegaly 02/16/2021 VQ scan, low probability for PE  Interim History / Subjective:  Currently asymptomatic but notes fatigue, generalized weakness, sudden episodes of breathlessness related to meals.  Objective   Blood pressure (!) 103/46, pulse (!) 57, temperature 97.9 F (36.6 C), temperature source Oral, resp. rate 17, height 5' 3" (1.6 m), weight 45.4 kg, SpO2 100 %.        Intake/Output Summary (Last 24 hours) at 02/18/2021 1721 Last data filed at 02/18/2021 0500 Gross per 24 hour  Intake 300 ml  Output 50 ml  Net 250 ml   Filed Weights   02/14/21 1824  Weight: 45.4 kg    Examination: GENERAL: Frail-appearing elderly woman, comfortable laying in bed.  Wearing nasal cannula O2 at 1 L/min, no tachypnea.  Sallow appearance. HEAD: Normocephalic, atraumatic.  EYES: Pupils equal, round, reactive to light.  No scleral icterus.  MOUTH: Oral mucosa moist. NECK: Supple. No thyromegaly. Trachea midline. No JVD.  No adenopathy. PULMONARY: Good air entry bilaterally.  No adventitious sounds. CARDIOVASCULAR: S1 and S2. Regular rate and rhythm.  2/6 systolic ejection murmur left sternal border ABDOMEN: Nondistended, soft, no hepatosplenomegaly.  No tenderness.  No masses. MUSCULOSKELETAL: No  joint deformity, no clubbing, no edema.  NEUROLOGIC: No focal deficit, speech is fluent. SKIN: Intact,warm,dry. PSYCH: Mood and behavior normal  Resolved Hospital Problem list   N/A  Assessment & Plan:  Acute episodes of breathlessness/shortness of  breath Associated with meals Query reflux with silent aspiration Right lower lobe infiltrate would indicate element of aspiration Trial of PPI twice a day Speech pathology evaluation with MBS Low-dose levo albuterol/Pulmicort for pulmonary hygiene  Nonspecific findings chest CT Query organizing pneumonia right lower lobe Differential is vast Not pulmonary fibrosis per se Cannot exclude amiodarone toxicity Check CBC with differential, ESR/CRP Avoiding steroids due to patient's prior severe side effects Inhaled corticosteroids as above  Advanced age and frailty This issue adds complexity to her management  Best Practice (right click and "Reselect all SmartList Selections" daily)   Diet/type: Regular consistency (see orders) DVT prophylaxis: DOAC GI prophylaxis: N/A Lines: N/A Foley:  N/A has pure wick in place Code Status:  limited Last date of multidisciplinary goals of care discussion [N/A]  Labs   CBC: Recent Labs  Lab 02/14/21 1832 02/15/21 0500 02/17/21 1104 02/18/21 0412  WBC 8.4 8.6 6.7 5.2  NEUTROABS 5.5  --   --   --   HGB 10.2* 9.6* 10.0* 9.6*  HCT 33.5* 31.7* 33.1* 30.4*  MCV 87.0 85.9 87.3 84.9  PLT 492* 447* 459* 433*    Basic Metabolic Panel: Recent Labs  Lab 02/14/21 1832 02/15/21 0500 02/16/21 0408 02/17/21 1104 02/18/21 0412  NA 138 139  --  135 139  K 4.2 4.1  --  3.8 3.8  CL 108 109  --  102 107  CO2 25 24  --  25 25  GLUCOSE 96 90  --  112* 83  BUN 16 13  --  18 17  CREATININE 0.95 0.99 1.03* 1.08* 0.94  CALCIUM 8.9 8.7*  --  9.0 8.7*  MG  --   --   --  2.2 2.2   GFR: Estimated Creatinine Clearance: 29.6 mL/min (by C-G formula based on SCr of 0.94 mg/dL). Recent Labs  Lab 02/14/21 1832 02/15/21 0228 02/15/21 0408 02/15/21 0500 02/17/21 1104 02/18/21 0412  PROCALCITON  --  <0.10  --   --   --   --   WBC 8.4  --   --  8.6 6.7 5.2  LATICACIDVEN  --   --  1.2  --   --   --     Liver Function Tests: Recent Labs  Lab  02/14/21 1832  AST 27  ALT 25  ALKPHOS 100  BILITOT 0.6  PROT 6.5  ALBUMIN 2.9*   No results for input(s): LIPASE, AMYLASE in the last 168 hours. No results for input(s): AMMONIA in the last 168 hours.  ABG    Component Value Date/Time   PHART 7.441 11/01/2020 2209   PCO2ART 37.6 11/01/2020 2209   PO2ART 337 (H) 11/01/2020 2209   HCO3 17.9 (L) 02/02/2021 0932   TCO2 27 11/01/2020 2209   ACIDBASEDEF 6.4 (H) 02/02/2021 0932   O2SAT 59.3 02/02/2021 0932     Coagulation Profile: No results for input(s): INR, PROTIME in the last 168 hours.  Cardiac Enzymes: No results for input(s): CKTOTAL, CKMB, CKMBINDEX, TROPONINI in the last 168 hours.  HbA1C: Hgb A1c MFr Bld  Date/Time Value Ref Range Status  11/02/2020 05:09 AM 5.8 (H) 4.8 - 5.6 % Final    Comment:    (NOTE) Pre diabetes:  5.7%-6.4%  Diabetes:              >6.4%  Glycemic control for   <7.0% adults with diabetes   10/16/2020 06:59 AM 6.1 (H) 4.8 - 5.6 % Final    Comment:    (NOTE) Pre diabetes:          5.7%-6.4%  Diabetes:              >6.4%  Glycemic control for   <7.0% adults with diabetes    Representative image of chest CT performed 02/15/2021 with evidence of right lower lobe infiltrate/organizing pneumonia otherwise CT was fairly "bland" with some mild groundglass opacities scattered which could indicate volume overload/inflammation:  CBG: No results for input(s): GLUCAP in the last 168 hours.  Review of Systems:   A 10 point review of systems was performed and it is as noted above otherwise negative.  Past Medical History:   Past Medical History:  Diagnosis Date   Atrial fibrillation ([place])    Benign breast cyst in female, left 10/07/2016   Colon adenomas    GERD (gastroesophageal reflux disease)    Hypertension    Hypothyroidism    Macular degeneration    Mitral regurgitation    Pulmonary embolism ([place]) 02/2016   SVT (supraventricular tachycardia) ([place])    Surgical History:    Past Surgical History:  Procedure Laterality Date   APPENDECTOMY  1960   BREAST CYST ASPIRATION Left 2017   CARDIOVERSION N/A 04/05/2016   Procedure: Cardioversion;  Surgeon: [name], MD;  Location: [facility] OR[facility];  Service: Cardiovascular;  Laterality: N/A;   CHOLECYSTECTOMY  1985   ESOPHAGOGASTRODUODENOSCOPY (EGD) WITH PROPOFOL N/A 11/27/2019   Procedure: ESOPHAGOGASTRODUODENOSCOPY (EGD) WITH PROPOFOL;  Surgeon: Lesly Rubenstein, MD;  Location: [facility];  Service: Endoscopy;  Laterality: N/A;   ESOPHAGOGASTRODUODENOSCOPY (EGD) WITH PROPOFOL N/A 12/24/2019   Procedure: ESOPHAGOGASTRODUODENOSCOPY (EGD) WITH PROPOFOL;  Surgeon: Lesly Rubenstein, MD;  Location: [facility] END[facility];  Service: Endoscopy;  Laterality: N/A;   IR CT HEAD LTD  11/01/2020   IR PERCUTANEOUS ART THROMBECTOMY/INFUSION INTRACRANIAL INC DIAG ANGIO  11/01/2020   IR US GUIDE VASC ACCESS RIGHT  11/02/2020   OVARIAN CYST REMOVAL     RADIOLOGY WITH ANESTHESIA N/A 11/01/2020   Procedure: RADIOLOGY WITH ANESTHESIA;  Surgeon: Luanne Bras, MD;  Location: [place];  Service: Radiology;  Laterality: N/A;   TEE WITHOUT CARDIOVERSION N/A 02/01/2018   Procedure: TRANSESOPHAGEAL ECHOCARDIOGRAM (TEE);  Surgeon: [name], MD;  Location: [facility];  Service: Cardiovascular;  Laterality: N/A;     Social History:   reports that she has never smoked. She has never used smokeless tobacco. She reports that she does not drink alcohol and does not use drugs.   Family History:   Family History  Problem Relation Age of Onset   Cancer Mother 50       breast cancer, lived to 10,    Breast cancer Mother 69   Cancer Son 62       pancreatic cancer   Heart disease Son        CAD, Tobacco Abuse    Heart disease Maternal Grandmother 80       died of massive MI   Stroke Sister    Allergies Allergies  Allergen Reactions   Statins     Severe myalgias     Home Medications  Prior to Admission medications   Medication  Sig Start Date End Date Taking? Authorizing Provider  acetaminophen (TYLENOL) 500 MG  tablet Take 500 mg by mouth every 6 (six) hours as needed.   Yes [provider]  amiodarone (PACERONE) 200 MG tablet Take 1/2 tablet (100 mg total) by mouth daily. Patient taking differently: Take 100 mg by mouth 2 (two) times daily. 11/13/20  Yes Love, Ivan Anchors, PA-C  ferrous sulfate 325 (65 FE) MG EC tablet Take 1 tablet (325 mg total) by mouth 2 (two) times daily. 01/15/21 07/14/21 Yes Mercy Riding, MD  levothyroxine (EUTHYROX) 50 MCG tablet Take 1 tablet (50 mcg total) by mouth daily at 6 (six) AM. 02/10/21 05/11/21 Yes [place] Donnamarie Poag, DO  mirtazapine (REMERON) 7.5 MG tablet Take 1 tablet (7.5 mg total) by mouth at bedtime. 01/25/21  Yes [name], MD  Multiple Vitamin (MULTIVITAMIN WITH MINERALS) TABS tablet Take 1 tablet by mouth daily. 11/13/20  Yes Love, Ivan Anchors, PA-C  Rivaroxaban (XARELTO) 15 MG TABS tablet Take 1 tablet (15 mg total) by mouth daily with supper. 11/13/20  Yes Love, Ivan Anchors, PA-C  senna-docusate (SENOKOT-S) 8.6-50 MG tablet Take 1 tablet by mouth 2 (two) times daily between meals as needed for mild constipation. 01/15/21  Yes Mercy Riding, MD  ALPRAZolam (XANAX) 0.25 MG tablet Take 1 tablet (0.25 mg total) by mouth at bedtime as needed for anxiety. 12/07/20   [name], MD  furosemide (LASIX) 20 MG tablet Take 2 tablets (40 mg total) by mouth daily. 01/15/21 04/15/21  Mercy Riding, MD    Scheduled Meds:  amiodarone  100 mg Oral BID   feeding supplement  237 mL Oral TID BM   ferrous sulfate  325 mg Oral BID   guaiFENesin  600 mg Oral BID   levothyroxine  50 mcg Oral Q0600   multivitamin with minerals  1 tablet Oral Daily   Rivaroxaban  15 mg Oral Q supper   Continuous Infusions: PRN Meds:.acetaminophen **OR** acetaminophen, ALPRAZolam, ipratropium-albuterol, magnesium hydroxide, senna-docusate, traZODone   Level 4 consult    C. Derrill Kay, MD Advanced  Bronchoscopy PCCM [facility]    *This note was dictated using voice recognition software/Dragon.  Despite best efforts to proofread, errors can occur which can change the meaning. Any transcriptional errors that result from this process are unintentional and may not be fully corrected at the time of dictation.

## 2021-02-18 NOTE — Progress Notes (Signed)
Occupational Therapy Treatment Patient Details Name: Frances Kent MRN: [id number] DOB: November 25, 1930 Today's Date: 02/18/2021   History of present illness 88 y.o. female with medical history significant for  hypertension, hyperlipidemia, stroke, GERD, hypothyroidism, depression with anxiety, interstitial lung disease, anemia, SSS, SVT, PE and atrial fibrillation on Xarelto, mitral valve prolapse, CHF with EF of 40-45%, bilateral carotid artery stenosis, who was recently admitted for atypical pneumonia on 12/20 and discharged on 12/28.  She has been doing fairly well until 1/1 when she started having worsening dyspnea with associated tachypnea.   OT comments  Pt seen for OT tx this date. Dtr present and supportive. Pt endorses fatigue and 7/10 back pain but agreeable to OOB for toileting. Pt required supervision/SBA for bed mobility and ADL transfers from std height bed and toilet with RW. Pt demo'd increased difficulty to perform requiring extra time but ultimately did not need physical assist to complete. Pt mod indep with pericare after toileting using lateral lean. Some assist at end of session to apply barrier cream present in room per pt/dtr's request. Pt progressing, continues to benefit from skilled OT. Continue to recommend [place]. RN notified, pt left off O2 at end of session. On room air was able to maintain SpO2 96-97% with exertion. Pt denied SOB throughout.    Recommendations for follow up therapy are one component of a multi-disciplinary discharge planning process, led by the attending physician.  Recommendations may be updated based on patient status, additional functional criteria and insurance authorization.    Follow Up Recommendations  Home health OT    Assistance Recommended at Discharge Frequent or constant Supervision/Assistance  Patient can return home with the following  A little help with walking and/or transfers;Assistance with cooking/housework;A little help with  bathing/dressing/bathroom;Help with stairs or ramp for entrance;Assist for transportation   Equipment Recommendations  None recommended by OT    Recommendations for Other Services      Precautions / Restrictions Precautions Precautions: Fall Restrictions Weight Bearing Restrictions: No       Mobility Bed Mobility Overal bed mobility: Needs Assistance Bed Mobility: Supine to Sit;Sit to Supine     Supine to sit: Supervision;HOB elevated Sit to supine: Supervision;HOB elevated        Transfers Overall transfer level: Needs assistance Equipment used: Rolling walker (2 wheels) Transfers: Sit to/from Stand Sit to Stand: Supervision                 Balance Overall balance assessment: Needs assistance Sitting-balance support: No upper extremity supported;Feet supported Sitting balance-Leahy Scale: Good     Standing balance support: Bilateral upper extremity supported Standing balance-Leahy Scale: Good                             ADL either performed or assessed with clinical judgement   ADL Overall ADL's : Needs assistance/impaired                         Toilet Transfer: Supervision/safety;Regular Toilet;Rolling walker (2 wheels) Toilet Transfer Details (indicate cue type and reason): increased effort/time, SBA Toileting- Clothing Manipulation and Hygiene: Modified independent;Sitting/lateral lean       Functional mobility during ADLs: Supervision/safety;Rolling walker (2 wheels)      Extremity/Trunk Assessment              Vision       Perception     Praxis      Cognition Arousal/Alertness:  Awake/alert Behavior During Therapy: WFL for tasks assessed/performed Overall Cognitive Status: Within Functional Limits for tasks assessed                                            Exercises     Shoulder Instructions       General Comments      Pertinent Vitals/ Pain       Pain Assessment: 0-10 Pain  Score: 7  Pain Location: Back pain Pain Descriptors / Indicators: Aching;Sore Pain Intervention(s): Limited activity within patient's tolerance;Monitored during session;Premedicated before session;Repositioned  Home Living                                          Prior Functioning/Environment              Frequency  Min 2X/week        Progress Toward Goals  OT Goals(current goals can now be found in the care plan section)  Progress towards OT goals: Progressing toward goals  Acute Rehab OT Goals Patient Stated Goal: to go home OT Goal Formulation: With patient/family Time For Goal Achievement: 03/02/21 Potential to Achieve Goals: Good  Plan Discharge plan remains appropriate;Frequency remains appropriate    Co-evaluation                 AM-PAC OT "6 Clicks" Daily Activity     Outcome Measure   Help from another person eating meals?: None Help from another person taking care of personal grooming?: A Little Help from another person toileting, which includes using toliet, bedpan, or urinal?: A Little Help from another person bathing (including washing, rinsing, drying)?: A Little Help from another person to put on and taking off regular upper body clothing?: A Little Help from another person to put on and taking off regular lower body clothing?: A Little 6 Click Score: 19    End of Session Equipment Utilized During Treatment: Rolling walker (2 wheels)  OT Visit Diagnosis: Muscle weakness (generalized) (M62.81)   Activity Tolerance Patient tolerated treatment well   Patient Left in bed;with call bell/phone within reach;with bed alarm set;with family/visitor present;Other (comment) (purewick in place, off O2)   Nurse Communication          Time: 9147-8295 OT Time Calculation (min): 16 min  Charges: OT General Charges $OT Visit: 1 Visit OT Treatments $Self Care/Home Management : 8-22 mins  Ardeth Perfect., MPH, MS, OTR/L ascom  [phone] 02/18/21, 4:10 PM

## 2021-02-18 NOTE — Progress Notes (Signed)
Pt reports shortness of breath. O2 sats 100% on room air. O2 applied at 2l per pt request. RT on unit and administered neb tx, with minimal effects noted. Pt given prn xanax and educated on slow breathing exercises. Upon reassessment pt noted to be resting comfortably. No distress noted. Will continue to monitor.

## 2021-02-18 NOTE — Plan of Care (Signed)
°  Problem: Activity: Goal: Ability to tolerate increased activity will improve Outcome: Progressing   Problem: Clinical Measurements: Goal: Ability to maintain a body temperature in the normal range will improve Outcome: Progressing   Problem: Respiratory: Goal: Ability to maintain adequate ventilation will improve Outcome: Progressing Goal: Ability to maintain a clear airway will improve Outcome: Progressing   Problem: Respiratory: Goal: Ability to maintain a clear airway will improve Outcome: Progressing

## 2021-02-19 ENCOUNTER — Inpatient Hospital Stay: Payer: Medicare HMO

## 2021-02-19 ENCOUNTER — Ambulatory Visit: Payer: Medicare HMO | Admitting: Internal Medicine

## 2021-02-19 DIAGNOSIS — K219 Gastro-esophageal reflux disease without esophagitis: Principal | ICD-10-CM

## 2021-02-19 DIAGNOSIS — R0902 Hypoxemia: Secondary | ICD-10-CM

## 2021-02-19 LAB — CBC WITH DIFFERENTIAL/PLATELET
Abs Immature Granulocytes: 0.04 10*3/uL (ref 0.00–0.07)
Basophils Absolute: 0.1 10*3/uL (ref 0.0–0.1)
Basophils Relative: 2 %
Eosinophils Absolute: 0.1 10*3/uL (ref 0.0–0.5)
Eosinophils Relative: 2 %
HCT: 31.5 % — ABNORMAL LOW (ref 36.0–46.0)
Hemoglobin: 9.6 g/dL — ABNORMAL LOW (ref 12.0–15.0)
Immature Granulocytes: 1 %
Lymphocytes Relative: 25 %
Lymphs Abs: 1.4 10*3/uL (ref 0.7–4.0)
MCH: 26.9 pg (ref 26.0–34.0)
MCHC: 30.5 g/dL (ref 30.0–36.0)
MCV: 88.2 fL (ref 80.0–100.0)
Monocytes Absolute: 0.6 10*3/uL (ref 0.1–1.0)
Monocytes Relative: 11 %
Neutro Abs: 3.2 10*3/uL (ref 1.7–7.7)
Neutrophils Relative %: 59 %
Platelets: 412 10*3/uL — ABNORMAL HIGH (ref 150–400)
RBC: 3.57 MIL/uL — ABNORMAL LOW (ref 3.87–5.11)
RDW: 23.4 % — ABNORMAL HIGH (ref 11.5–15.5)
Smear Review: NORMAL
WBC: 5.4 10*3/uL (ref 4.0–10.5)
nRBC: 0 % (ref 0.0–0.2)

## 2021-02-19 LAB — BASIC METABOLIC PANEL
Anion gap: 8 (ref 5–15)
BUN: 20 mg/dL (ref 8–23)
CO2: 24 mmol/L (ref 22–32)
Calcium: 8.8 mg/dL — ABNORMAL LOW (ref 8.9–10.3)
Chloride: 106 mmol/L (ref 98–111)
Creatinine, Ser: 1.15 mg/dL — ABNORMAL HIGH (ref 0.44–1.00)
GFR, Estimated: 46 mL/min — ABNORMAL LOW (ref >60)
Glucose, Bld: 81 mg/dL (ref 70–99)
Potassium: 3.7 mmol/L (ref 3.5–5.1)
Sodium: 138 mmol/L (ref 135–145)

## 2021-02-19 LAB — C-REACTIVE PROTEIN: CRP: 1 mg/dL — ABNORMAL HIGH (ref <1.0)

## 2021-02-19 LAB — SEDIMENTATION RATE: Sed Rate: 56 mm/hr — ABNORMAL HIGH (ref 0–30)

## 2021-02-19 LAB — MAGNESIUM: Magnesium: 2.2 mg/dL (ref 1.7–2.4)

## 2021-02-19 NOTE — Consult Note (Signed)
CARDIOLOGY CONSULT NOTE               Patient ID: Frances Kent MRN: [id number] DOB/AGE: 1930/07/04 88 y.o.  Admit date: 02/15/2021 Referring Physician Dr. Enzo Bi Primary Physician Deborra Medina, MD Primary Cardiologist [name] Reason for Consultation paroxysmal afib,   HPI: 88 year old female with a history of paroxysmal atrial fibrillation on amiodarone and xarelto, HFrEF (EF 40 to 45%), severe MR, hypertension, history of PE, hypothyroidism, GERD, Esophageal stricture w/ Dilation in 2021 who has had multiple hospitalizations over the past 4 months who presented to [facility] ED 02/15/2021 with worsening shortness of breath.  Cardiology is consulted for her paroxysmal atrial fibrillation and rhythm control strategies other than amiodarone due to questionable amiodarone toxicity.  She was most recently hospitalized to [facility] from 12/20-12/28 for atypical pneumonia which was treated with broad-spectrum antibiotics.  She came back to [facility] ED 1/2 with sudden onset of worsening shortness of breath.  The patient is accompanied by her daughter who contributes to this history.  The patient was sitting in her recliner the day prior to this readmission and suddenly became dyspneic and tachypneic for which she took Xanax and that resolved her symptoms for a period of time.  She presented was treated with empiric antibiotics in the ED, chest x-ray showed mild bibasilar subsegmental atelectasis with minimal pleural effusions, chest CT revealed tiny focus of low-attenuation along the lower left lobe of the pulmonary artery which may represent a small artifact with recommendation to correlate with VQ scan or repeat CTA, moderate severity areas of scarring and/or atelectasis are seen within the bilateral apices with mild posterior left basilar atelectasis.  VQ scan resulted with very low probability examination for pulmonary embolism by modified perfusion only.  During interview today the patient states she feels  pretty good compared to yesterday.  She attributes this to getting some acid reflux medicine and wearing her oxygen, however she is only on 1 L by nasal cannula and on room air her O2 sats remain well above 90.  The patient's daughter states her shortness of breath is mostly after she eats food and she is unable to eat much as a result of this, however since starting Protonix today this is helped.  She is also received Xopenex and budesonide treatments which have reportedly helped her breathing a little too.  The patient had a swallow study today for which results are pending, patient reportedly has a hiatal hernia.  The patient has not been on telemetry today. EKG performed at bedside reveals normal sinus rhythm with first-degree AV block, right bundle branch block, left anterior fascicular block ventricular rate 66 bpm -which looks quite similar to her recent EKG 02/02/2021.  The patient is apparently been on amiodarone for quite some time and is on a maintenance dose of 100 mg twice daily.  When she is in atrial fibrillation she feels terrible, and has had cardioversions in the past with Dr. [name].  Amiodarone has worked very well to keep her in normal sinus rhythm. The patient currently denies chest pain, shortness of breath, palpitations presyncope, syncope, lower extremity edema.  Labs are significant for creatinine 1.15, EGFR 46, H/H 9.6/31.5, plts 412.    Review of systems complete and found to be negative unless listed above     Past Medical History:  Diagnosis Date   Atrial fibrillation ([place])    Benign breast cyst in female, left 10/07/2016   Colon adenomas    GERD (gastroesophageal reflux disease)  Hypertension    Hypothyroidism    Macular degeneration    Mitral regurgitation    Pulmonary embolism ([place]) 02/2016   SVT (supraventricular tachycardia) ([facility])     Past Surgical History:  Procedure Laterality Date   APPENDECTOMY  1960   BREAST CYST ASPIRATION Left 2017    CARDIOVERSION N/A 04/05/2016   Procedure: Cardioversion;  Surgeon: [name], MD;  Location: [facility] ORS;  Service: Cardiovascular;  Laterality: N/A;   CHOLECYSTECTOMY  1985   ESOPHAGOGASTRODUODENOSCOPY (EGD) WITH PROPOFOL N/A 11/27/2019   Procedure: ESOPHAGOGASTRODUODENOSCOPY (EGD) WITH PROPOFOL;  Surgeon: Lesly Rubenstein, MD;  Location: [facility] ENDOSCOPY;  Service: Endoscopy;  Laterality: N/A;   ESOPHAGOGASTRODUODENOSCOPY (EGD) WITH PROPOFOL N/A 12/24/2019   Procedure: ESOPHAGOGASTRODUODENOSCOPY (EGD) WITH PROPOFOL;  Surgeon: Lesly Rubenstein, MD;  Location: [facility] ENDOSCOPY;  Service: Endoscopy;  Laterality: N/A;   IR CT HEAD LTD  11/01/2020   IR PERCUTANEOUS ART THROMBECTOMY/INFUSION INTRACRANIAL INC DIAG ANGIO  11/01/2020   IR US GUIDE VASC ACCESS RIGHT  11/02/2020   OVARIAN CYST REMOVAL     RADIOLOGY WITH ANESTHESIA N/A 11/01/2020   Procedure: RADIOLOGY WITH ANESTHESIA;  Surgeon: Luanne Bras, MD;  Location: [place];  Service: Radiology;  Laterality: N/A;   TEE WITHOUT CARDIOVERSION N/A 02/01/2018   Procedure: TRANSESOPHAGEAL ECHOCARDIOGRAM (TEE);  Surgeon: [name], MD;  Location: [facility] ORS;  Service: Cardiovascular;  Laterality: N/A;    Medications Prior to Admission  Medication Sig Dispense Refill Last Dose   acetaminophen (TYLENOL) 500 MG tablet Take 500 mg by mouth every 6 (six) hours as needed.   02/15/2021 at 0800   amiodarone (PACERONE) 200 MG tablet Take 1/2 tablet (100 mg total) by mouth daily. (Patient taking differently: Take 100 mg by mouth 2 (two) times daily.) 15 tablet 0 02/15/2021 at 0800   ferrous sulfate 325 (65 FE) MG EC tablet Take 1 tablet (325 mg total) by mouth 2 (two) times daily. 180 tablet 1 02/15/2021 at 0800   levothyroxine (EUTHYROX) 50 MCG tablet Take 1 tablet (50 mcg total) by mouth daily at 6 (six) AM. 30 tablet 2 02/15/2021 at 0600   mirtazapine (REMERON) 7.5 MG tablet Take 1 tablet (7.5 mg total) by mouth at bedtime. 30 tablet 2 02/14/2021 at 2000    Multiple Vitamin (MULTIVITAMIN WITH MINERALS) TABS tablet Take 1 tablet by mouth daily.   02/15/2021 at 0800   Rivaroxaban (XARELTO) 15 MG TABS tablet Take 1 tablet (15 mg total) by mouth daily with supper. 30 tablet 0 02/14/2021 at 1800   senna-docusate (SENOKOT-S) 8.6-50 MG tablet Take 1 tablet by mouth 2 (two) times daily between meals as needed for mild constipation. 60 tablet 0 02/15/2021 at 1400   ALPRAZolam (XANAX) 0.25 MG tablet Take 1 tablet (0.25 mg total) by mouth at bedtime as needed for anxiety. 20 tablet 0 prn at unknown   furosemide (LASIX) 20 MG tablet Take 2 tablets (40 mg total) by mouth daily. 90 tablet 1 prn at unknown   Social History   Socioeconomic History   Marital status: Widowed    Spouse name: Not on file   Number of children: Not on file   Years of education: Not on file   Highest education level: Not on file  Occupational History   Not on file  Tobacco Use   Smoking status: Never   Smokeless tobacco: Never   Tobacco comments:    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 , worked at [employer], Development worker, 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 used  Substance and Sexual Activity   Alcohol use: No   Drug use: No   Sexual activity: Not Currently  Other Topics Concern   Not on file  Social History Narrative   Has caretaking responsibility for 3 young grandchildren who live with her   Social Determinants of Health   Financial Resource Strain: Not on file  Food Insecurity: Not on file  Transportation Needs: Not on file  Physical Activity: Not on file  Stress: Not on file  Social Connections: Not on file  Intimate Partner Violence: Not on file    Family History  Problem Relation Age of Onset   Cancer Mother 17       breast cancer, lived to 38,    Breast cancer Mother 62   Cancer Son 50       pancreatic cancer   Heart disease Son        CAD, Tobacco Abuse    Heart disease Maternal Grandmother 80       died of massive MI   Stroke Sister       Review of systems complete and  found to be negative unless listed above    PHYSICAL EXAM General: Pleasant and elderly appearing Caucasian female, in no acute distress.  Lying nearly flat in bed with daughter at bedside. HEENT:  Normocephalic and atraumatic. Neck:  No JVD.  Lungs: Normal respiratory effort on 1 L by nasal cannula. Clear bilaterally to auscultation. No wheezes, crackles, rhonchi.  Heart: HRRR . Normal S1 and S2 without gallops. No mumur. Radial & DP pulses 2+ bilaterally. Abdomen: Non-distended appearing.  Msk: Normal strength and tone for age. Extremities: No clubbing, cyanosis or edema.   Neuro: Alert and oriented X 3. Psych:  Mood appropriate, affect congruent.   Labs:   Lab Results  Component Value Date   WBC 5.4 02/19/2021   HGB 9.6 (L) 02/19/2021   HCT 31.5 (L) 02/19/2021   MCV 88.2 02/19/2021   PLT 412 (H) 02/19/2021    Recent Labs  Lab 02/14/21 1832 02/15/21 0500 02/19/21 0447  NA 138   < > 138  K 4.2   < > 3.7  CL 108   < > 106  CO2 25   < > 24  BUN 16   < > 20  CREATININE 0.95   < > 1.15*  CALCIUM 8.9   < > 8.8*  PROT 6.5  --   --   BILITOT 0.6  --   --   ALKPHOS 100  --   --   ALT 25  --   --   AST 27  --   --   GLUCOSE 96   < > 81   < > = values in this interval not displayed.   Lab Results  Component Value Date   CKTOTAL 83 05/09/2018   TROPONINI <0.03 02/25/2018    Lab Results  Component Value Date   CHOL 144 11/02/2020   CHOL 284 (H) 12/11/2019   CHOL 208 (H) 08/31/2018   Lab Results  Component Value Date   HDL 48 11/02/2020   HDL 100.10 12/11/2019   HDL 72.40 08/31/2018   Lab Results  Component Value Date   LDLCALC 81 11/02/2020   LDLCALC 166 (H) 12/11/2019   LDLCALC 116 (H) 08/31/2018   Lab Results  Component Value Date   TRIG 76 11/02/2020   TRIG 72 11/02/2020   TRIG 91.0 12/11/2019   Lab Results  Component Value Date   CHOLHDL  3.0 11/02/2020   CHOLHDL 3 12/11/2019   CHOLHDL 3 08/31/2018   Lab Results  Component Value Date    LDLDIRECT 185.3 03/27/2013   LDLDIRECT 156.1 03/23/2012   LDLDIRECT 181.4 01/17/2011      Radiology: DG Chest 2 View  Result Date: 02/14/2021 CLINICAL DATA:  Shortness of breath. EXAM: CHEST - 2 VIEW COMPARISON:  February 08, 2021. FINDINGS: Stable cardiomediastinal silhouette. Mild bibasilar subsegmental atelectasis is noted. Minimal pleural effusions may be present. Bony thorax is unremarkable. IMPRESSION: Mild bibasilar subsegmental atelectasis with minimal pleural effusions. Electronically Signed   By: Marijo Conception M.D.   On: 02/14/2021 19:09   CT Angio Chest PE W and/or Wo Contrast  Result Date: 02/15/2021 CLINICAL DATA:  Shortness of breath and labored breathing. EXAM: CT ANGIOGRAPHY CHEST WITH CONTRAST TECHNIQUE: Multidetector CT imaging of the chest was performed using the standard protocol during bolus administration of intravenous contrast. Multiplanar CT image reconstructions and MIPs were obtained to evaluate the vascular anatomy. CONTRAST:  67m OMNIPAQUE IOHEXOL 350 MG/ML SOLN COMPARISON:  October 05, 2020 FINDINGS: Cardiovascular: There is mild calcification of the aortic arch, without evidence of aortic aneurysm. A tiny (2.8 mm) focus of intraluminal low attenuation is seen involving and anterior lower lobe branch of the left pulmonary artery (axial CT image 169, CT series 5). There is mild cardiomegaly. No pericardial effusion. Mediastinum/Nodes: Mild pretracheal lymphadenopathy is noted. Thyroid gland, trachea, and esophagus demonstrate no significant findings. Lungs/Pleura: Moderate severity areas of scarring and/or atelectasis are seen within the bilateral apices with mild posterior left basilar atelectasis. Moderate severity infiltrate is seen within the posterolateral aspect of the right lower lobe. A very small left pleural effusion is present. No pneumothorax is identified. Upper Abdomen: Multiple surgical clips are seen within the gallbladder fossa. Musculoskeletal: No chest wall  abnormality. No acute or significant osseous findings. Review of the MIP images confirms the above findings. IMPRESSION: 1. Tiny focus of low attenuation along a lower lobe branch of the left pulmonary artery which may represent a small amount of artifact. Correlation with follow-up chest CTA or nuclear medicine ventilation/perfusion scan is recommended if pulmonary embolism remains of clinical concern. 2. Moderate severity posterolateral right lower lobe infiltrate infiltrate. 3. Very small left pleural effusion. 4. Moderate severity areas of scarring and/or atelectasis are seen within the bilateral apices with mild posterior left basilar atelectasis. 5. Mild cardiomegaly. 6. Aortic atherosclerosis. Aortic Atherosclerosis (ICD10-I70.0). Electronically Signed   By: TVirgina NorfolkM.D.   On: 02/15/2021 03:09   NM Pulmonary Perfusion  Result Date: 02/16/2021 CLINICAL DATA:  PE suspected EXAM: NUCLEAR MEDICINE PERFUSION LUNG SCAN TECHNIQUE: Perfusion images were obtained in multiple projections after intravenous injection of radiopharmaceutical. Ventilation scans intentionally deferred if perfusion scan and chest x-ray adequate for interpretation during COVID 19 epidemic. RADIOPHARMACEUTICALS:  4.5 mCi Tc-945mAA IV COMPARISON:  Same day chest radiograph, CT angiogram 02/15/2021 FINDINGS: Normal, homogeneous pulmonary perfusion. No suspicious perfusion defects. Cardiomegaly. IMPRESSION: Very low probability examination for pulmonary embolism by modified perfusion only PIOPED criteria (PE absent). Electronically Signed   By: AlDelanna Ahmadi.D.   On: 02/16/2021 13:11   DG Chest Port 1 View  Result Date: 02/16/2021 CLINICAL DATA:  Hypoxia EXAM: PORTABLE CHEST 1 VIEW COMPARISON:  Two-view chest x-ray 02/14/2021.  CTA chest 02/15/2021. FINDINGS: Heart is enlarged. Atherosclerotic changes are noted at the arch. Asymmetric right lower lobe airspace disease remains. Chronic interstitial changes noted. IMPRESSION: 1.  Persistent right lower lobe airspace disease concerning for pneumonia.  2. Cardiomegaly without failure. 3. Chronic interstitial changes. Electronically Signed   By: San Morelle M.D.   On: 02/16/2021 08:41   DG Chest Port 1 View  Result Date: 02/08/2021 CLINICAL DATA:  Shortness of breath. EXAM: PORTABLE CHEST 1 VIEW COMPARISON:  02/05/2021 FINDINGS: 0906 hours. The cardio pericardial silhouette is enlarged. Vascular congestion with diffuse interstitial opacity is similar to prior. Interval improvement in right base airspace disease. The visualized bony structures of the thorax show no acute abnormality. Telemetry leads overlie the chest. IMPRESSION: Interval improvement in right base airspace disease. Electronically Signed   By: Misty Stanley M.D.   On: 02/08/2021 09:59   DG Chest Port 1 View  Result Date: 02/05/2021 CLINICAL DATA:  Shortness of breath. EXAM: PORTABLE CHEST 1 VIEW COMPARISON:  Chest x-ray dated February 02, 2021. FINDINGS: Stable cardiomegaly. Patchy consolidation in the right lower lobe has mildly improved. Unchanged small right pleural effusion. No pneumothorax. No acute osseous abnormality. IMPRESSION: 1. Mildly improved right lower lobe pneumonia. Electronically Signed   By: Titus Dubin M.D.   On: 02/05/2021 08:09   DG Chest Portable 1 View  Result Date: 02/02/2021 CLINICAL DATA:  Shortness of breath in a female at age 49. EXAM: PORTABLE CHEST 1 VIEW COMPARISON:  January 12, 2021. FINDINGS: EKG leads project over the chest. Trachea midline. Heart size is enlarged as before. Graded opacity suggested at the RIGHT lung base. Marked increased density about the RIGHT mid chest compared to the previous exam and scattered multifocal opacities including RIGHT lower lobe in the RIGHT retrocardiac region. Mild increased interstitial prominence in the LEFT chest. No visible pneumothorax. On limited assessment there is no acute skeletal process. IMPRESSION: Findings suspicious  for multifocal pneumonia in the RIGHT chest potentially associated with RIGHT-sided effusion. Cardiomegaly with signs of vascular congestion. Also correlate with any signs of heart failure. Electronically Signed   By: Zetta Bills M.D.   On: 02/02/2021 10:03    ECHO 11/01/2020 Left ventricular ejection fraction, by estimation, is 40 to 45%. The left ventricle has mildly decreased function. The left ventricle has no regional wall motion abnormalities. Left ventricular diastolic parameters are indeterminate. 1. 2. Right ventricular systolic function is normal. The right ventricular size is normal. 3. Left atrial size was moderately dilated. 4. Right atrial size was moderately dilated. The mitral valve is normal in structure. Moderate to severe mitral valve regurgitation. No evidence of mitral stenosis. 5. 6. Tricuspid valve regurgitation is moderate. The aortic valve is normal in structure. Aortic valve regurgitation is trivial. No aortic stenosis is present. 7. The inferior vena cava is normal in size with greater than 50% respiratory variability, suggesting right atrial pressure of 3 mmHg.  TELEMETRY reviewed by me: Reviewed from 1/3 reveals sinus bradycardia with first-degree AV block rate of 59.  EKG reviewed by me: EKG performed at bedside reveals normal sinus rhythm with first-degree AV block, right bundle branch block, left anterior fascicular block ventricular rate 66 bpm -which looks quite similar to her recent EKG 02/02/2021.  ASSESSMENT AND PLAN:  88 year old female with a history of paroxysmal atrial fibrillation on amiodarone and xarelto, HFrEF (EF 40 to 45%), severe MR, hypertension, history of PE, hypothyroidism, GERD, Esophageal stricture w/ Dilation in 2021 who has had multiple hospitalizations over the past 4 months who presented to [facility] ED 02/15/2021 with worsening shortness of breath.  Cardiology is consulted for her paroxysmal atrial fibrillation and rhythm control  strategies other than amiodarone due to questionable amiodarone toxicity.  paroxysmal  atrial fibrillation on amiodarone and xarelto History of PE  HFrEF (EF 40 to 45%), severe MR Hypertension The patient appears euvolemic on exam today, denies shortness of breath during interview.  The patient and her daughter agree that her shortness of breath has mostly been after meals and she can only take a couple bites of food at a time due to this dyspnea.  She states her dyspnea has improved today after getting Protonix and she was able to eat a full meal without difficulty.  Additionally, the patient feels "terrible" when she is in atrial fibrillation and amiodarone has worked very well for her, she is on a low maintenance dose of 100 mg twice daily and is currently in normal sinus rhythm. -Recommend continuing patient on Protonix regimen to see if this improves patient's symptoms, consider GI consult if needed -Continue albuterol and Pulmicort to see if this improves her symptoms too -Recommend continuing amiodarone due to the patient's success with rhythm control and symptoms while on this medication. -Continue Xarelto -Blood pressure has been soft (low 161W /high 96E systolic and heart rate has been ~high 50s-60s.   Hypothyroidism Continue current medications per primary team  This patient's case was discussed with Dr. Isaias Cowman and he is in agreement with this plan of care.   Signed: Tristan Schroeder , PA-C 02/19/2021, 1:39 PM

## 2021-02-19 NOTE — Care Management Important Message (Signed)
Important Message  Patient Details  Name: Frances Kent MRN: [id number] Date of Birth: 06-09-1930   Medicare Important Message Given:  Yes     Dannette Barbara 02/19/2021, 11:02 AM

## 2021-02-19 NOTE — Progress Notes (Signed)
PROGRESS NOTE    Frances Kent  OXB:[id number] DOB: 1931/01/17 DOA: 02/15/2021 PCP: [name], MD  [phone]   Assessment & Plan:   Principal Problem:   Pneumonia Active Problems:   CAP (community acquired pneumonia)   Acute decompensated heart failure ([place])    88 year old Caucasian female history significant for hypertension, hyperlipidemia, CVA, GERD, hypothyroid, depression, interstitial lung disease, atrial fibrillation/PE on Xarelto, mitral valve prolapse, CH F with EF 40 to 45% recently admitted and treated for atypical pneumonia on 12/20, discharged on 12/28.  Doing well until yesterday when patient started having worsening dyspnea with associated tachypnea.   Unclear whether this represents a true infection versus exacerbation of interstitial lung disease versus possible small PE.  Mild exacerbation of CHF remains on differential though BNP appears to be at baseline.  PE felt unlikely given reported adherence to home anticoagulant regimen.   Acute hypoxic respiratory failure, ruled out --no documented hypoxia.  Dyspnea  --already treated for PNA with a course of broad spectrum abx recently, not strong evidence of active bacterial PNA.  No obvious pulm edema or fluid overload.  Daughter noted dyspnea happened after eating a meal, however, SLP eval found no aspiration. --pulm consulted, with High suspicion for reflux with silent aspiration, started on PPI BID. Plan: --Cardiology consult today to assess for possible amiodarone toxicity.  --cont protonix 20 mg BID --Continue Xopenex 0.63 mg twice daily followed by Pulmicort 0.25 mg twice daily  chronic systolic congestive heart failure Mitral regurg Per patient's daughter patient has not been taking Lasix daily Has not taken several days before admission due to lack of weight gain and no swelling of lower extremities BNP elevated, but improved from prior.  No strong evidence of acute exacerbation. --attempts with  trial IV lasix results in Cr increase the very next day. Plan: --resume home lasix 40 mg daily   PE, ruled out Mild possible filling defect noted on CT Unlikely given adherence to Xarelto VQ scan ruled out PE   Atrial fibrillation, paroxysmal Rate controlled --cont home amiodarone --cont home Xarelto --cardio consult today   Hypothyroidism --cont home Synthroid   Anxiety --cont Xanax BID PRN   DVT prophylaxis: [id number]  Code Status: Limited code  Family Communication: daughter updated at bedside today  Level of care: Med-Surg Dispo:   The patient is from: home Anticipated d/c is to: home Anticipated d/c date is: tomorrow   Subjective and Interval History:  Pt reported breathing better today, unclear why.  Initial swallow eval showed no aspiration.  Cardiology consult today to assess for possible amiodarone toxicity.    Objective: Vitals:   02/19/21 0343 02/19/21 0812 02/19/21 1059 02/19/21 1523  BP: (!) 116/45 (!) 112/54  (!) 126/52  Pulse: 68 61 69 63  Resp: 18 20  18   Temp: 98.5 F (36.9 C) 98.2 F (36.8 C)  98.6 F (37 C)  TempSrc: Oral Oral    SpO2: 99% 95% 96% 97%  Weight:      Height:        Intake/Output Summary (Last 24 hours) at 02/19/2021 1804 Last data filed at 02/19/2021 1424 Gross per 24 hour  Intake 600 ml  Output 800 ml  Net -200 ml   Filed Weights   02/14/21 1824  Weight: 45.4 kg    Examination:   Constitutional: NAD, AAOx3, frail  HEENT: conjunctivae and lids normal, EOMI CV: No cyanosis.   RESP: normal respiratory effort, on RA Neuro: II - XII grossly intact.  Psych: Normal mood and affect.  Appropriate judgement and reason   Data Reviewed: I have personally reviewed following labs and imaging studies  CBC: Recent Labs  Lab 02/14/21 1832 02/15/21 0500 02/17/21 1104 02/18/21 0412 02/19/21 0447  WBC 8.4 8.6 6.7 5.2 5.4  NEUTROABS 5.5  --   --   --  3.2  HGB 10.2* 9.6* 10.0* 9.6* 9.6*  HCT 33.5* 31.7* 33.1* 30.4*  31.5*  MCV 87.0 85.9 87.3 84.9 88.2  PLT 492* 447* 459* 433* 882*   Basic Metabolic Panel: Recent Labs  Lab 02/14/21 1832 02/15/21 0500 02/16/21 0408 02/17/21 1104 02/18/21 0412 02/19/21 0447  NA 138 139  --  135 139 138  K 4.2 4.1  --  3.8 3.8 3.7  CL 108 109  --  102 107 106  CO2 25 24  --  25 25 24   GLUCOSE 96 90  --  112* 83 81  BUN 16 13  --  18 17 20   CREATININE 0.95 0.99 1.03* 1.08* 0.94 1.15*  CALCIUM 8.9 8.7*  --  9.0 8.7* 8.8*  MG  --   --   --  2.2 2.2 2.2   GFR: Estimated Creatinine Clearance: 24.2 mL/min (A) (by C-G formula based on SCr of 1.15 mg/dL (H)). Liver Function Tests: Recent Labs  Lab 02/14/21 1832  AST 27  ALT 25  ALKPHOS 100  BILITOT 0.6  PROT 6.5  ALBUMIN 2.9*   No results for input(s): LIPASE, AMYLASE in the last 168 hours. No results for input(s): AMMONIA in the last 168 hours. Coagulation Profile: No results for input(s): INR, PROTIME in the last 168 hours. Cardiac Enzymes: No results for input(s): CKTOTAL, CKMB, CKMBINDEX, TROPONINI in the last 168 hours. BNP (last 3 results) No results for input(s): PROBNP in the last 8760 hours. HbA1C: No results for input(s): HGBA1C in the last 72 hours. CBG: No results for input(s): GLUCAP in the last 168 hours. Lipid Profile: No results for input(s): CHOL, HDL, LDLCALC, TRIG, CHOLHDL, LDLDIRECT in the last 72 hours. Thyroid Function Tests: No results for input(s): TSH, T4TOTAL, FREET4, T3FREE, THYROIDAB in the last 72 hours. Anemia Panel: No results for input(s): VITAMINB12, FOLATE, FERRITIN, TIBC, IRON, RETICCTPCT in the last 72 hours. Sepsis Labs: Recent Labs  Lab 02/15/21 0228 02/15/21 0408  PROCALCITON <0.10  --   LATICACIDVEN  --  1.2    Recent Results (from the past 240 hour(s))  Resp Panel by RT-PCR (Flu A&B, Covid) Nasopharyngeal Swab     Status: None   Collection Time: 02/14/21  7:43 PM   Specimen: Nasopharyngeal Swab; Nasopharyngeal(NP) swabs in vial transport medium   Result Value Ref Range Status   SARS Coronavirus 2 by RT PCR NEGATIVE NEGATIVE Final    Comment: (NOTE) SARS-CoV-2 target nucleic acids are NOT DETECTED.  The SARS-CoV-2 RNA is generally detectable in upper respiratory specimens during the acute phase of infection. The lowest concentration of SARS-CoV-2 viral copies this assay can detect is 138 copies/mL. A negative result does not preclude SARS-Cov-2 infection and should not be used as the sole basis for treatment or other patient management decisions. A negative result may occur with  improper specimen collection/handling, submission of specimen other than nasopharyngeal swab, presence of viral mutation(s) within the areas targeted by this assay, and inadequate number of viral copies(<138 copies/mL). A negative result must be combined with clinical observations, patient history, and epidemiological information. The expected result is Negative.  Fact Sheet for Patients:  EntrepreneurPulse.com.au  Fact  Sheet for Healthcare Providers:  IncredibleEmployment.be  This test is no t yet approved or cleared by the [place] FDA and  has been authorized for detection and/or diagnosis of SARS-CoV-2 by FDA under an Emergency Use Authorization (EUA). This EUA will remain  in effect (meaning this test can be used) for the duration of the COVID-19 declaration under Section 564(b)(1) of the Act, 21 U.S.C.section 360bbb-3(b)(1), unless the authorization is terminated  or revoked sooner.       Influenza A by PCR NEGATIVE NEGATIVE Final   Influenza B by PCR NEGATIVE NEGATIVE Final    Comment: (NOTE) The Xpert Xpress SARS-CoV-2/FLU/RSV plus assay is intended as an aid in the diagnosis of influenza from Nasopharyngeal swab specimens and should not be used as a sole basis for treatment. Nasal washings and aspirates are unacceptable for Xpert Xpress SARS-CoV-2/FLU/RSV testing.  Fact Sheet for  Patients: EntrepreneurPulse.com.au  Fact Sheet for Healthcare Providers: IncredibleEmployment.be  This test is not yet approved or cleared by the [place] FDA and has been authorized for detection and/or diagnosis of SARS-CoV-2 by FDA under an Emergency Use Authorization (EUA). This EUA will remain in effect (meaning this test can be used) for the duration of the COVID-19 declaration under Section 564(b)(1) of the Act, 21 U.S.C. section 360bbb-3(b)(1), unless the authorization is terminated or revoked.  Performed at [facility], [address] [place], [place] [address]   Blood culture (single)     Status: None (Preliminary result)   Collection Time: 02/15/21  4:08 AM   Specimen: Right Antecubital; Blood  Result Value Ref Range Status   Specimen Description RIGHT ANTECUBITAL  Final   Special Requests   Final    BOTTLES DRAWN AEROBIC AND ANAEROBIC Blood Culture results may not be optimal due to an excessive volume of blood received in culture bottles   Culture   Final    NO GROWTH 4 DAYS Performed at [facility], [address], [place], [place] [address]    Report Status PENDING  Incomplete  MRSA Next Gen by PCR, Nasal     Status: None   Collection Time: 02/15/21  4:40 AM   Specimen: Nasal Mucosa; Nasal Swab  Result Value Ref Range Status   MRSA by PCR Next Gen NOT DETECTED NOT DETECTED Final    Comment: (NOTE) The GeneXpert MRSA Assay (FDA approved for NASAL specimens only), is one component of a comprehensive MRSA colonization surveillance program. It is not intended to diagnose MRSA infection nor to guide or monitor treatment for MRSA infections. Test performance is not FDA approved in patients less than 57 years old. Performed at [facility], [address] [place], [place] [address]   Culture, blood (Routine X 2) w Reflex to ID Panel     Status: None (Preliminary result)   Collection Time:  02/15/21 11:45 PM   Specimen: BLOOD  Result Value Ref Range Status   Specimen Description BLOOD RIGHT WRIST  Final   Special Requests   Final    BOTTLES DRAWN AEROBIC AND ANAEROBIC Blood Culture adequate volume   Culture   Final    NO GROWTH 3 DAYS Performed at [facility], [address], [place], [place] [address]    Report Status PENDING  Incomplete  Culture, blood (Routine X 2) w Reflex to ID Panel     Status: None (Preliminary result)   Collection Time: 02/15/21 11:45 PM   Specimen: BLOOD  Result Value Ref Range Status   Specimen  Description BLOOD RIGHT HAND  Final   Special Requests IN PEDIATRIC BOTTLE Blood Culture adequate volume  Final   Culture   Final    NO GROWTH 3 DAYS Performed at [facility], [address], [place], [place] [address]    Report Status PENDING  Incomplete      Radiology Studies: No results found.   Scheduled Meds:  amiodarone  100 mg Oral BID   budesonide (PULMICORT) nebulizer solution  0.25 mg Nebulization BID   feeding supplement  237 mL Oral TID BM   ferrous sulfate  325 mg Oral BID   guaiFENesin  600 mg Oral BID   levalbuterol  0.63 mg Nebulization TID   levothyroxine  50 mcg Oral Q0600   multivitamin with minerals  1 tablet Oral Daily   pantoprazole  20 mg Oral BID AC   Rivaroxaban  15 mg Oral Q supper   Continuous Infusions:   LOS: 3 days     Enzo Bi, MD [facility] If 7PM-7AM, please contact night-coverage 02/19/2021, 6:04 PM

## 2021-02-19 NOTE — Evaluation (Addendum)
Objective Swallowing Evaluation: Type of Study: MBS-Modified Barium Swallow Study   Patient Details  Name: Frances Kent MRN: [id number] Date of Birth: 1930/05/17  Today's Date: 02/19/2021 Time: SLP Start Time (ACUTE ONLY): 0840 -SLP Stop Time (ACUTE ONLY): 0940  SLP Time Calculation (min) (ACUTE ONLY): 60 min   Past Medical History:  Past Medical History:  Diagnosis Date   Atrial fibrillation ([place])    Benign breast cyst in female, left 10/07/2016   Colon adenomas    GERD (gastroesophageal reflux disease)    Hypertension    Hypothyroidism    Macular degeneration    Mitral regurgitation    Pulmonary embolism ([place]) 02/2016   SVT (supraventricular tachycardia) ([place])    Past Surgical History:  Past Surgical History:  Procedure Laterality Date   APPENDECTOMY  1960   BREAST CYST ASPIRATION Left 2017   CARDIOVERSION N/A 04/05/2016   Procedure: Cardioversion;  Surgeon: [name], MD;  Location: [facility] OR[facility];  Service: Cardiovascular;  Laterality: N/A;   CHOLECYSTECTOMY  1985   ESOPHAGOGASTRODUODENOSCOPY (EGD) WITH PROPOFOL N/A 11/27/2019   Procedure: ESOPHAGOGASTRODUODENOSCOPY (EGD) WITH PROPOFOL;  Surgeon: Lesly Rubenstein, MD;  Location: [facility] END[facility];  Service: Endoscopy;  Laterality: N/A;   ESOPHAGOGASTRODUODENOSCOPY (EGD) WITH PROPOFOL N/A 12/24/2019   Procedure: ESOPHAGOGASTRODUODENOSCOPY (EGD) WITH PROPOFOL;  Surgeon: Lesly Rubenstein, MD;  Location: [facility] END[facility];  Service: Endoscopy;  Laterality: N/A;   IR CT HEAD LTD  11/01/2020   IR PERCUTANEOUS ART THROMBECTOMY/INFUSION INTRACRANIAL INC DIAG ANGIO  11/01/2020   IR US GUIDE VASC ACCESS RIGHT  11/02/2020   OVARIAN CYST REMOVAL     RADIOLOGY WITH ANESTHESIA N/A 11/01/2020   Procedure: RADIOLOGY WITH ANESTHESIA;  Surgeon: Luanne Bras, MD;  Location: [place];  Service: Radiology;  Laterality: N/A;   TEE WITHOUT CARDIOVERSION N/A 02/01/2018   Procedure: TRANSESOPHAGEAL ECHOCARDIOGRAM (TEE);  Surgeon: [name], MD;  Location: [facility];  Service: Cardiovascular;  Laterality: N/A;   HPI: Pt is an 88 y.o. female with medical history significant for  hypertension, hyperlipidemia, stroke, GERD and Esophageal stricture w/ Dilation in 2021, hypothyroidism, depression with anxiety, interstitial lung disease, anemia, SSS, SVT, PE and atrial fibrillation on Xarelto, mitral valve prolapse, CHF with EF of 40-45%, bilateral carotid artery stenosis, who was recently admitted for atypical pneumonia on 12/20 and discharged on 12/28.  She has been doing fairly well until 1/1 when she started having worsening dyspnea with associated tachypnea. Pt was recently at [facility]: transferred to [facility] from [facility] for cerebral angio with thrombectomy of L-ICA and L-MCA with TICI 3.  Echocardiogram with ejection fraction of 40-45%, left atrial enlargement and moderate to severe mitral valve regurgitation.  Follow up MRI/MRA brain showed tiny acute infarcts in left basal ganglia and patent L-MCA.    OF NOTE: pt has a GI history as per chart notes -- per her chart in 2021, she had a EGD: "Food was found in the lower third of the esophagus. The food was extremely soft and mushed. It was very easily pushed into stomach.  Findings:  A mild Schatzki ring was found in the lower third of the esophagus.  Patchy mild inflammation characterized by erythema was found in the stomach.".   Subjective: pt awake, verbally engaged w/ staff/SLP. she followed all instructions. She verbalized concern w/ having to eat/drink: "I might throw it back up".     Recommendations for follow up therapy are one component of a multi-disciplinary discharge planning process, led by the attending physician.  Recommendations  may be updated based on patient status, additional functional criteria and insurance authorization.  Assessment / Plan / Recommendation  Clinical Impressions 02/19/2021  Clinical Impression Pt appears to present w/ grossly adequate oropharyngeal phase  swallowing function, though Timeliness of oral phase A-P transfer was inconsistent w/ a slight-min delay d/t pt's hesitancy and trepidation of swallowing. This is Baseline per pt for several weeks++. No apparent pharyngeal phase dysphagia noted at this evaluation today. No aspiration noted. No neurological history but h/o Esophageal phase Dysmotility.  During the Esophageal phase, a min+ prominent cricopharyngeus muscle w/ protrusion of tissue along the posterior Cervical Esophageal wall w/ trace+ anterior thickening was noted. This protrusion was noted fairly consistently during the swallow, but it did not impede bolus motility/clearing of any consistency through the PE segment and into the Cervical Esophagus. Also noted mild slowness in the clearing of the boluses through the viewable Cervical Esophagus(moreso solids). Suspect this could be the sensation pt states she experiences when eating particular solids(breads, meats).  Again, no suspected neuromuscular deficits(no neurological deficits/history per chart) of swallowing; just pt's trepidation of swallowing causing delay in A-P transfer of boluses during the oral phase. No aspiration or laryngeal penetration of po trials occurred during this study. Slight premature spillage of thin liquids into the pharynx noted x1 w/ Larger bolus size trial occurred. Smaller sips appeared to offer her better management and relief -- "I only take 1 sip at a time" when asked to do multiple sip trials. Timely pharyngeal swallow initiation at the level of the BOT-Valleculae occurred w/ all trial consistencies; airway closure appeared timely, tight. No pharyngeal residue remained post swallow indicating adequate laryngeal excursion and pharyngeal pressure during the swallow. During the oral phase, appropriate bolus management for mastication and complete oral clearing occurred w/ all trials. Hesitancy was noted in A-P transfer w/ all trial consistencies. Intermittent lingual  pumping movements as she readied the transfer. Loss of bolus control w/ premature spillage of thin liquids noted x1.   Discussed results of MBSS and impact of Esophageal issues such as Reflux activity, Esophageal phase Dysmotility, and Retrograde food/liquid activity on oropharyngeal swallowing, including her hesitancy to swallow as she stated d/t Regurgitation concern. Video viewed and questions answered. Recommended f/u w/ GI for formal assessment of Esophageal dysmotility in light of pt's h/o Stricture w/ need for Dilation, Reflux management, and further education. Recommend general aspiration and Reflux precautions and avoiding foods causing discomfort such as breads/meats; moistening all foods; smaller, single bites/sips as she does Baseline per her report.  SLP Visit Diagnosis Dysphagia, pharyngoesophageal phase (R13.14)  Attention and concentration deficit following --  Frontal lobe and executive function deficit following --  Impact on safety and function (No Data)      Treatment Recommendations 02/19/2021  Treatment Recommendations No treatment recommended at this time     Prognosis 02/19/2021  Prognosis for Safe Diet Advancement Good  Barriers to Reach Goals Time post onset;Severity of deficits  Barriers/Prognosis Comment Esophageal phase dsymotility    Diet Recommendations 02/19/2021  SLP Diet Recommendations Dysphagia 3 (Mech soft) solids;Thin liquid  Liquid Administration via Cup;No straw  Medication Administration Whole meds with puree  Compensations Minimize environmental distractions;Slow rate;Small sips/bites;Follow solids with liquid  Postural Changes Remain semi-upright after after feeds/meals (Comment);Seated upright at 90 degrees      Other Recommendations 02/19/2021  Recommended Consults Consider GI evaluation;Consider esophageal assessment  Oral Care Recommendations Oral care BID;Patient independent with oral care  Other Recommendations (No Data)  Follow Up  Recommendations  No SLP follow up  Assistance recommended at discharge None  Functional Status Assessment Patient has not had a recent decline in their functional status    Frequency and Duration  02/19/2021  Speech Therapy Frequency (ACUTE ONLY) (No Data)  Treatment Duration (No Data)      Oral Phase 02/19/2021  Oral Phase Impaired  Oral - Pudding Teaspoon --  Oral - Pudding Cup --  Oral - Honey Teaspoon --  Oral - Honey Cup NT  Oral - Nectar Teaspoon 1  Oral - Nectar Cup 2  Oral - Nectar Straw --  Oral - Thin Teaspoon 2  Oral - Thin Cup 3  Oral - Thin Straw --  Oral - Puree 1  Oral - Mech Soft 1  Oral - Regular --  Oral - Multi-Consistency --  Oral - Pill --  Oral Phase - Comment --    Pharyngeal Phase 02/19/2021  Pharyngeal Phase WFL  Pharyngeal- Pudding Teaspoon --  Pharyngeal --  Pharyngeal- Pudding Cup --  Pharyngeal --  Pharyngeal- Honey Teaspoon --  Pharyngeal --  Pharyngeal- Honey Cup NT  Pharyngeal --  Pharyngeal- Nectar Teaspoon 1  Pharyngeal --  Pharyngeal- Nectar Cup 2  Pharyngeal --  Pharyngeal- Nectar Straw --  Pharyngeal --  Pharyngeal- Thin Teaspoon 2  Pharyngeal --  Pharyngeal- Thin Cup 3  Pharyngeal --  Pharyngeal- Thin Straw --  Pharyngeal --  Pharyngeal- Puree 1  Pharyngeal --  Pharyngeal- Mechanical Soft 1  Pharyngeal --  Pharyngeal- Regular --  Pharyngeal --  Pharyngeal- Multi-consistency --  Pharyngeal --  Pharyngeal- Pill --  Pharyngeal --  Pharyngeal Comment --     Cervical Esophageal Phase  02/19/2021  Cervical Esophageal Phase Impaired  Pudding Teaspoon --  Pudding Cup --  Honey Teaspoon --  Honey Cup NT  Nectar Teaspoon 1  Nectar Cup 2  Nectar Straw --  Thin Teaspoon 2  Thin Cup 3  Thin Straw --  Puree 1  Mechanical Soft 1  Regular --  Multi-consistency --  Pill --  Cervical Esophageal Comment --         Orinda Kenner, MS, CCC-SLP Speech Language Pathologist Rehab  Services [phone] [facility] 02/19/2021, 2:35 PM

## 2021-02-19 NOTE — Progress Notes (Signed)
Physical Therapy Treatment Patient Details Name: Frances Kent MRN: [id number] DOB: 05/28/1930 Today's Date: 02/19/2021   History of Present Illness 88 y.o. female with medical history significant for  hypertension, hyperlipidemia, stroke, GERD, hypothyroidism, depression with anxiety, interstitial lung disease, anemia, SSS, SVT, PE and atrial fibrillation on Xarelto, mitral valve prolapse, CHF with EF of 40-45%, bilateral carotid artery stenosis, who was recently admitted for atypical pneumonia on 12/20 and discharged on 12/28.  She has been doing fairly well until 1/1 when she started having worsening dyspnea with associated tachypnea.    PT Comments    Pt in bed agreeable to session, on 1L/min since back from MBS, 99% SpO2, no dyspnea at entry or later in session. Anxiety regarding potential dyspnea is a limiter in session. This is the first day pt has been able to perform out of room walking. Pt bradycardic at entry 58bpm in bed, then only 71 and 69bpm after each walking interval. SpO2 96% after each AMB interval on room air. Pt does not feel safe to maintain balance with RW alone, but can be safe with +1 assist in home, which is what family has been providing PTA. Family in room during session, educated on lack of hypoxia in session, as well as potential for other etiologies to cause dyspnea symptoms aside from isolated hypoxia. Pt not particularly receptive to idea that anything other than supplemental O2 will provide relief in future episodes.      Recommendations for follow up therapy are one component of a multi-disciplinary discharge planning process, led by the attending physician.  Recommendations may be updated based on patient status, additional functional criteria and insurance authorization.  Follow Up Recommendations  Home health PT     Assistance Recommended at Discharge Intermittent Supervision/Assistance  Patient can return home with the following     Equipment  Recommendations  None recommended by PT    Recommendations for Other Services       Precautions / Restrictions Precautions Precautions: Fall Restrictions Weight Bearing Restrictions: No     Mobility  Bed Mobility Overal bed mobility: Modified Independent                  Transfers Overall transfer level: Modified independent Equipment used: Rolling walker (2 wheels)               General transfer comment: noted moderate effort needed    Ambulation/Gait   Gait Distance (Feet): 100 Feet (150 twice with seated break between; terminal sats 96% both times; terminal HR: 71bpm and 69bpm)               Stairs             Wheelchair Mobility    Modified Rankin (Stroke Patients Only)       Balance                                            Cognition Arousal/Alertness: Awake/alert Behavior During Therapy: WFL for tasks assessed/performed;Anxious Overall Cognitive Status: Within Functional Limits for tasks assessed                                          Exercises      General Comments        Pertinent  Vitals/Pain Pain Assessment: No/denies pain    Home Living                          Prior Function            PT Goals (current goals can now be found in the care plan section) Acute Rehab PT Goals Patient Stated Goal: to return home PT Goal Formulation: With patient Time For Goal Achievement: 03/02/21 Potential to Achieve Goals: Good Progress towards PT goals: Progressing toward goals    Frequency    Min 2X/week      PT Plan Current plan remains appropriate    Co-evaluation              AM-PAC PT "6 Clicks" Mobility   Outcome Measure  Help needed turning from your back to your side while in a flat bed without using bedrails?: None Help needed moving from lying on your back to sitting on the side of a flat bed without using bedrails?: None Help needed moving to  and from a bed to a chair (including a wheelchair)?: A Little Help needed standing up from a chair using your arms (e.g., wheelchair or bedside chair)?: A Little Help needed to walk in hospital room?: A Little Help needed climbing 3-5 steps with a railing? : A Lot 6 Click Score: 19    End of Session Equipment Utilized During Treatment: Gait belt Activity Tolerance: Patient limited by fatigue;Other (comment) (anxious about potential for dyspnea episode) Patient left: in chair;with call bell/phone within reach;with family/visitor present Nurse Communication: Mobility status PT Visit Diagnosis: Difficulty in walking, not elsewhere classified (R26.2);Muscle weakness (generalized) (M62.81)     Time: 1537-9432 PT Time Calculation (min) (ACUTE ONLY): 23 min  Charges:  $Therapeutic Exercise: 23-37 mins                    11:05 AM, 02/19/21 Etta Grandchild, PT, DPT Physical Therapist - [facility]  [phone] ([place])    Tyera Hansley C 02/19/2021, 11:02 AM

## 2021-02-19 NOTE — Progress Notes (Signed)
NAME:  Frances Kent, MRN:  [id number], DOB:  [birth year], LOS: 3 ADMISSION DATE:  02/15/2021, CONSULTATION DATE: 02/18/2021 REFERRING MD: Enzo Bi, MD, CHIEF COMPLAINT: Sudden episodes of shortness of breath  History of Present Illness:  Patient is an 88 year old lifelong never smoker who was admitted to [facility] on 15 February 2021 with a complaint of shortness of breath.  She was recently admitted to [facility] for pneumonia in 02 February 2021 through 10 February 2021.  Infiltrate on the right lower lobe.  She apparently had been doing well until the day prior to this current admission when she started having dyspnea and tachypnea which were sudden in onset and episodic during the day.  We are asked to evaluate her for the these issues.  The patient tells me today that these episodes occur after meals.  She just becomes very breathless and anxious.  We are also asked to render opinion on her current CT scan.  The patient has not endorsed any fevers, chills or sweats.  No chest pain per se but does noted heaviness particularly after meals when these episodes of breathlessness occur.  Breathlessness is relieved by oxygen and anxiolytic.  She has received DuoNebs during these episodes but feels that these are not helpful.  Per RT the patient actually requested that the nebulizers be made as needed as she becomes more short of breath with them.  The patient does not endorse any dysphagia she is not aware of reflux however states that she has a hiatal hernia.  She has not had any lower extremity edema.  She has an extensive history of paroxysmal atrial fibrillation and has been on amiodarone and Xarelto.  Left ventricular ejection fraction is 40 to 45% she has severe MR.  She has had multiple hospitalizations over the last 4 months with issues related to her cardiac diagnoses.   Pertinent  Medical History  Assessment atrial fibrillation paroxysmal on amiodarone Severe mitral valve regurgitation Moderate  tricuspid regurgitation  Significant Hospital Events: Including procedures, antibiotic start and stop dates in addition to other pertinent events   02/15/2021 CT angio chest persistent right lower lobe infiltrate mild residual scarring/atelectasis bilateral apices (this is not pulmonary fibrosis nor interstitial lung disease, nonspecific finding) mild cardiomegaly. 02/16/2021 chest x-ray: Persistent right lower lobe airspace disease , cardiomegaly 02/16/2021 VQ scan, low probability for PE 02/19/2021 MBSS: no aspiration w/ any consistency on the MBSS, slow oral transfer d/t patient's trepidation towards swallowing.  Noted slow esophageal clearing in the viewable cervical esophagus, not distally.   Interim History / Subjective:  So far today no episodes of sudden dyspnea.  Able to tolerate meals so far today without issues.  She is on PPI twice a day.  Notes oxygen helps, feels that may be low-dose nebs also help some.  Does not voice any other complaint  Objective   Blood pressure (!) 112/54, pulse 69, temperature 98.2 F (36.8 C), temperature source Oral, resp. rate 20, height _0  (1.6 m), weight 45.4 kg, SpO2 96 %.        Intake/Output Summary (Last 24 hours) at 02/19/2021 1516 Last data filed at 02/19/2021 1424 Gross per 24 hour  Intake 600 ml  Output 800 ml  Net -200 ml    Filed Weights   02/14/21 1824  Weight: 45.4 kg    Examination: GENERAL: Frail-appearing elderly woman, comfortable laying in bed.  Wearing nasal cannula O2 at 1 L/min, no tachypnea.  Sallow appearance.  High suspicion for HEAD: Normocephalic, atraumatic.  EYES: Pupils equal, round, reactive to light.  No scleral icterus.  MOUTH: Oral mucosa moist. NECK: Supple. No thyromegaly. Trachea midline. No JVD.  No adenopathy. PULMONARY: Good air entry bilaterally.  No adventitious sounds. CARDIOVASCULAR: S1 and S2. Regular rate and rhythm.  2/6 systolic ejection murmur left sternal border ABDOMEN: Nondistended,  soft, no hepatosplenomegaly.  No tenderness.  No masses. MUSCULOSKELETAL: No joint deformity, no clubbing, no edema.  NEUROLOGIC: No focal deficit, speech is fluent. SKIN: Intact,warm,dry. PSYCH: Mood and behavior normal  Resolved Hospital Problem list   N/A  Assessment & Plan:  Acute episodes of breathlessness/shortness of breath Associated with meals High suspicion for reflux with silent aspiration Right lower lobe infiltrate would indicate aspiration PNA (previously treated) Continue PPI twice a day (Protonix 20 mg twice daily) Antireflux measures Appreciate speech pathology input  Continue Xopenex 0.63 mg twice daily followed by Pulmicort 0.25 mg twice daily May discharge home on this regimen  Nonspecific findings chest CT Query organizing pneumonia right lower lobe Differential is vast Not pulmonary fibrosis per se Cannot exclude amiodarone toxicity, currently less likely CBC with differential, ESR/CRP mild elevations Avoiding steroids due to patient's prior severe side effects Inhaled corticosteroids as above Patient received adequate antibiotic therapy Post pneumonia CT may take 8 to 12 weeks to clear Follow as outpatient  Advanced age and frailty This issue adds complexity to her management  Best Practice (right click and "Reselect all SmartList Selections" daily)   Diet/type: Regular consistency (see orders) DVT prophylaxis: DOAC GI prophylaxis: N/A Lines: N/A Foley:  N/A has pure wick in place Code Status:  limited Last date of multidisciplinary goals of care discussion [N/A]  Labs   CBC: Recent Labs  Lab 02/14/21 1832 02/15/21 0500 02/17/21 1104 02/18/21 0412 02/19/21 0447  WBC 8.4 8.6 6.7 5.2 5.4  NEUTROABS 5.5  --   --   --  3.2  HGB 10.2* 9.6* 10.0* 9.6* 9.6*  HCT 33.5* 31.7* 33.1* 30.4* 31.5*  MCV 87.0 85.9 87.3 84.9 88.2  PLT 492* 447* 459* 433* 412*     Basic Metabolic Panel: Recent Labs  Lab 02/14/21 1832 02/15/21 0500  02/16/21 0408 02/17/21 1104 02/18/21 0412 02/19/21 0447  NA 138 139  --  135 139 138  K 4.2 4.1  --  3.8 3.8 3.7  CL 108 109  --  102 107 106  CO2 25 24  --  _0 GLUCOSE 96 90  --  112* 83 81  BUN 16 13  --  _1 CREATININE 0.95 0.99 1.03* 1.08* 0.94 1.15*  CALCIUM 8.9 8.7*  --  9.0 8.7* 8.8*  MG  --   --   --  2.2 2.2 2.2    GFR: Estimated Creatinine Clearance: 24.2 mL/min (A) (by C-G formula based on SCr of 1.15 mg/dL (H)). Recent Labs  Lab 02/15/21 0228 02/15/21 0408 02/15/21 0500 02/17/21 1104 02/18/21 0412 02/19/21 0447  PROCALCITON <0.10  --   --   --   --   --   WBC  --   --  8.6 6.7 5.2 5.4  LATICACIDVEN  --  1.2  --   --   --   --      Liver Function Tests: Recent Labs  Lab 02/14/21 1832  AST 27  ALT 25  ALKPHOS 100  BILITOT 0.6  PROT 6.5  ALBUMIN 2.9*    No results for input(s): LIPASE, AMYLASE in the last 168 hours. No results for  input(s): AMMONIA in the last 168 hours.  ABG    Component Value Date/Time   PHART 7.441 11/01/2020 2209   PCO2ART 37.6 11/01/2020 2209   PO2ART 337 (H) 11/01/2020 2209   HCO3 17.9 (L) 02/02/2021 0932   TCO2 27 11/01/2020 2209   ACIDBASEDEF 6.4 (H) 02/02/2021 0932   O2SAT 59.3 02/02/2021 0932      Coagulation Profile: No results for input(s): INR, PROTIME in the last 168 hours.  Cardiac Enzymes: No results for input(s): CKTOTAL, CKMB, CKMBINDEX, TROPONINI in the last 168 hours.  HbA1C: Hgb A1c MFr Bld  Date/Time Value Ref Range Status  11/02/2020 05:09 AM 5.8 (H) 4.8 - 5.6 % Final    Comment:    (NOTE) Pre diabetes:          5.7%-6.4%  Diabetes:              >6.4%  Glycemic control for   <7.0% adults with diabetes   10/16/2020 06:59 AM 6.1 (H) 4.8 - 5.6 % Final    Comment:    (NOTE) Pre diabetes:          5.7%-6.4%  Diabetes:              >6.4%  Glycemic control for   <7.0% adults with diabetes    Representative image of chest CT performed 02/15/2021 with evidence of right lower  lobe infiltrate/organizing pneumonia otherwise CT was fairly "bland" with some mild groundglass opacities scattered which could indicate volume overload/inflammation:  CBG: No results for input(s): GLUCAP in the last 168 hours.  Review of Systems:   A 10 point review of systems was performed and it is as noted above otherwise negative.  Past Medical History:   Past Medical History:  Diagnosis Date   Atrial fibrillation ([place])    Benign breast cyst in female, left 10/07/2016   Colon adenomas    GERD (gastroesophageal reflux disease)    Hypertension    Hypothyroidism    Macular degeneration    Mitral regurgitation    Pulmonary embolism ([place]) 02/2016   SVT (supraventricular tachycardia) ([place])    Surgical History:   Past Surgical History:  Procedure Laterality Date   APPENDECTOMY  1960   BREAST CYST ASPIRATION Left 2017   CARDIOVERSION N/A 04/05/2016   Procedure: Cardioversion;  Surgeon: [name], MD;  Location: [facility] ORS;  Service: Cardiovascular;  Laterality: N/A;   CHOLECYSTECTOMY  1985   ESOPHAGOGASTRODUODENOSCOPY (EGD) WITH PROPOFOL N/A 11/27/2019   Procedure: ESOPHAGOGASTRODUODENOSCOPY (EGD) WITH PROPOFOL;  Surgeon: Lesly Rubenstein, MD;  Location: [facility] END[facility];  Service: Endoscopy;  Laterality: N/A;   ESOPHAGOGASTRODUODENOSCOPY (EGD) WITH PROPOFOL N/A 12/24/2019   Procedure: ESOPHAGOGASTRODUODENOSCOPY (EGD) WITH PROPOFOL;  Surgeon: Lesly Rubenstein, MD;  Location: [facility] ENDOSCOPY;  Service: Endoscopy;  Laterality: N/A;   IR CT HEAD LTD  11/01/2020   IR PERCUTANEOUS ART THROMBECTOMY/INFUSION INTRACRANIAL INC DIAG ANGIO  11/01/2020   IR US GUIDE VASC ACCESS RIGHT  11/02/2020   OVARIAN CYST REMOVAL     RADIOLOGY WITH ANESTHESIA N/A 11/01/2020   Procedure: RADIOLOGY WITH ANESTHESIA;  Surgeon: Luanne Bras, MD;  Location: [place];  Service: Radiology;  Laterality: N/A;   TEE WITHOUT CARDIOVERSION N/A 02/01/2018   Procedure: TRANSESOPHAGEAL ECHOCARDIOGRAM (TEE);   Surgeon: [name], MD;  Location: [facility] ORS;  Service: Cardiovascular;  Laterality: N/A;     Social History:   reports that she has never smoked. She has never used smokeless tobacco. She reports that she does not drink alcohol  and does not use drugs.   Family History:   Family History  Problem Relation Age of Onset   Cancer Mother 72       breast cancer, lived to 59,    Breast cancer Mother 52   Cancer Son 53       pancreatic cancer   Heart disease Son        CAD, Tobacco Abuse    Heart disease Maternal Grandmother 80       died of massive MI   Stroke Sister    Allergies Allergies  Allergen Reactions   Statins     Severe myalgias     Home Medications  Prior to Admission medications   Medication Sig Start Date End Date Taking? Authorizing Provider  acetaminophen (TYLENOL) 500 MG tablet Take 500 mg by mouth every 6 (six) hours as needed.   Yes [provider]  amiodarone (PACERONE) 200 MG tablet Take 1/2 tablet (100 mg total) by mouth daily. Patient taking differently: Take 100 mg by mouth 2 (two) times daily. 11/13/20  Yes Love, Ivan Anchors, PA-C  ferrous sulfate 325 (65 FE) MG EC tablet Take 1 tablet (325 mg total) by mouth 2 (two) times daily. 01/15/21 07/14/21 Yes Mercy Riding, MD  levothyroxine (EUTHYROX) 50 MCG tablet Take 1 tablet (50 mcg total) by mouth daily at 6 (six) AM. 02/10/21 05/11/21 Yes [place] Donnamarie Poag, DO  mirtazapine (REMERON) 7.5 MG tablet Take 1 tablet (7.5 mg total) by mouth at bedtime. 01/25/21  Yes [name], MD  Multiple Vitamin (MULTIVITAMIN WITH MINERALS) TABS tablet Take 1 tablet by mouth daily. 11/13/20  Yes Love, Ivan Anchors, PA-C  Rivaroxaban (XARELTO) 15 MG TABS tablet Take 1 tablet (15 mg total) by mouth daily with supper. 11/13/20  Yes Love, Ivan Anchors, PA-C  senna-docusate (SENOKOT-S) 8.6-50 MG tablet Take 1 tablet by mouth 2 (two) times daily between meals as needed for mild constipation. 01/15/21  Yes Mercy Riding, MD  ALPRAZolam  (XANAX) 0.25 MG tablet Take 1 tablet (0.25 mg total) by mouth at bedtime as needed for anxiety. 12/07/20   [name], MD  furosemide (LASIX) 20 MG tablet Take 2 tablets (40 mg total) by mouth daily. 01/15/21 04/15/21  Mercy Riding, MD    Scheduled Meds:  amiodarone  100 mg Oral BID   budesonide (PULMICORT) nebulizer solution  0.25 mg Nebulization BID   feeding supplement  237 mL Oral TID BM   ferrous sulfate  325 mg Oral BID   guaiFENesin  600 mg Oral BID   levalbuterol  0.63 mg Nebulization TID   levothyroxine  50 mcg Oral Q0600   multivitamin with minerals  1 tablet Oral Daily   pantoprazole  20 mg Oral BID AC   Rivaroxaban  15 mg Oral Q supper   Continuous Infusions: PRN Meds:.acetaminophen **OR** acetaminophen, ALPRAZolam, magnesium hydroxide, senna-docusate, traZODone   Level 2 follow-up    Patient appears to be doing better with proton pump inhibitor twice a day.  We will follow-up on current CT findings as an outpatient.  Consider checking for oxygen qualification for home though at present I do not believe that she will qualify.  May need ambulatory oximetry prior to discharge.  Has follow-up appointment with Dr. Deborra Medina her primary care physician on 10 January at 2 PM.  I communicate well with the patient's primary care physician.  She has a follow-up with me on 7 February at 3:30 PM.  We will  try to move this appointment up however, schedule has been very tight of late I can confer with Dr. Derrel Nip should at the time of patient's follow-up with her.  We will sign off for now.  Please reconsult as needed.   Renold Don, MD Advanced Bronchoscopy PCCM [facility]    *This note was dictated using voice recognition software/Dragon.  Despite best efforts to proofread, errors can occur which can change the meaning. Any transcriptional errors that result from this process are unintentional and may not be fully corrected at the time of dictation.

## 2021-02-20 LAB — BASIC METABOLIC PANEL
Anion gap: 7 (ref 5–15)
BUN: 15 mg/dL (ref 8–23)
CO2: 25 mmol/L (ref 22–32)
Calcium: 8.9 mg/dL (ref 8.9–10.3)
Chloride: 105 mmol/L (ref 98–111)
Creatinine, Ser: 1.03 mg/dL — ABNORMAL HIGH (ref 0.44–1.00)
GFR, Estimated: 52 mL/min — ABNORMAL LOW (ref >60)
Glucose, Bld: 86 mg/dL (ref 70–99)
Potassium: 3.9 mmol/L (ref 3.5–5.1)
Sodium: 137 mmol/L (ref 135–145)

## 2021-02-20 LAB — CBC
HCT: 30.1 % — ABNORMAL LOW (ref 36.0–46.0)
Hemoglobin: 9.3 g/dL — ABNORMAL LOW (ref 12.0–15.0)
MCH: 26.3 pg (ref 26.0–34.0)
MCHC: 30.9 g/dL (ref 30.0–36.0)
MCV: 85.3 fL (ref 80.0–100.0)
Platelets: 369 10*3/uL (ref 150–400)
RBC: 3.53 MIL/uL — ABNORMAL LOW (ref 3.87–5.11)
RDW: 23 % — ABNORMAL HIGH (ref 11.5–15.5)
WBC: 4.7 10*3/uL (ref 4.0–10.5)
nRBC: 0 % (ref 0.0–0.2)

## 2021-02-20 LAB — MAGNESIUM: Magnesium: 2.1 mg/dL (ref 1.7–2.4)

## 2021-02-20 LAB — CULTURE, BLOOD (SINGLE): Culture: NO GROWTH

## 2021-02-20 LAB — PHOSPHORUS: Phosphorus: 3.9 mg/dL (ref 2.5–4.6)

## 2021-02-20 MED ORDER — PANTOPRAZOLE SODIUM 20 MG PO TBEC: 20.0000 mg | DELAYED_RELEASE_TABLET | Freq: Two times a day (BID) | ORAL | 0 refills | Status: DC

## 2021-02-20 MED ORDER — AMIODARONE HCL 200 MG PO TABS: 100.0000 mg | ORAL_TABLET | Freq: Two times a day (BID) | ORAL | Status: DC

## 2021-02-20 MED ORDER — FUROSEMIDE 40 MG PO TABS
40.0000 mg | ORAL_TABLET | Freq: Every day | ORAL | Status: DC
  Administered 2021-02-20: 40 mg via ORAL
  Filled 2021-02-20: qty 1

## 2021-02-20 MED ORDER — BUDESONIDE 0.25 MG/2ML IN SUSP
0.2500 mg | Freq: Two times a day (BID) | RESPIRATORY_TRACT | 2 refills | Status: DC
Start: 2021-02-20 — End: 2021-05-13

## 2021-02-20 MED ORDER — LEVALBUTEROL HCL 0.63 MG/3ML IN NEBU: 0.6300 mg | INHALATION_SOLUTION | Freq: Two times a day (BID) | RESPIRATORY_TRACT | 2 refills | Status: DC

## 2021-02-20 MED ORDER — ENSURE ENLIVE PO LIQD: 237.0000 mL | Freq: Three times a day (TID) | ORAL | 12 refills | Status: DC

## 2021-02-20 NOTE — TOC Transition Note (Addendum)
Transition of [facility]) - CM/SW Discharge Note   Patient Details  Name: Frances Kent MRN: [id number] Date of Birth: [birth year]  [facility]) CM/SW Contact:  Magnus Ivan, LCSW Phone Number: 02/20/2021, 9:36 AM   Clinical Narrative:   Patient has orders to DC home.  Notified Cory with [facility]. Referral made to [facility] with [employer] for nebulizer and possible home o2. Waiting on o2 sats from RN, will order home o2 if indicated.  1:22- Confirmed with MD and RN no o2 needs. Call from RN asking if nebulizer can be delivered to the home, patient does not want to wait for it. Called [employer], spoke to [place] who said they can deliver it to the home but the earliest would be Monday, or patient can wait for it to be delivered today to the bedside. She is aware patient is going to DC just waiting on the nebulizer. Notified RN  of options. RN spoke with patient and family who elected for Monday delivery to the home. Notified Jasmine with [employer].   Final next level of [place]: [place] Barriers to Discharge: Barriers Resolved   Patient Goals and CMS Choice        Discharge Placement                       Discharge Plan and Services                          HH Arranged: PT, OT, RN, Nurse's Aide, Social Work [facility] Agency: [place] Date [facility] Agency Contacted: 02/20/21   Representative spoke with at [place]: [place] Interventions     Readmission Risk Interventions Readmission Risk Prevention Plan 02/16/2021 01/14/2021 11/01/2020  Transportation Screening Complete Complete Complete  PCP or Specialist Appt within 5-7 Days - - Complete  Medication Review (RN CM) - - Complete  Medication Review Press photographer) Complete Complete -  PCP or Specialist appointment within 3-5 days of discharge - Complete -  [place] or Home [facility] Consult Complete Complete -  SW Recovery [facility]/Counseling Consult  Complete Complete -  Palliative [place] Screening Not Applicable Not Applicable -  [place] - Not Applicable -  Some recent data might be hidden

## 2021-02-20 NOTE — Progress Notes (Signed)
Frances Kent to be D/C'd Home per MD order.  Discussed prescriptions and follow up appointments with the patient. Prescriptions given to patient, medication list explained in detail. Pt verbalized understanding. [employer] will deliver nebulizer to home on Monday. Patient will use daughter's until then  Allergies as of 02/20/2021       Reactions   Statins    Severe myalgias        Medication List     TAKE these medications    acetaminophen 500 MG tablet Commonly known as: TYLENOL Take 500 mg by mouth every 6 (six) hours as needed.   ALPRAZolam 0.25 MG tablet Commonly known as: XANAX Take 1 tablet (0.25 mg total) by mouth at bedtime as needed for anxiety.   amiodarone 200 MG tablet Commonly known as: PACERONE Take 0.5 tablets (100 mg total) by mouth 2 (two) times daily. Home med. What changed:  when to take this additional instructions   budesonide 0.25 MG/2ML nebulizer solution Commonly known as: PULMICORT Take 2 mLs (0.25 mg total) by nebulization 2 (two) times daily.   feeding supplement Liqd Take 237 mLs by mouth 3 (three) times daily between meals.   ferrous sulfate 325 (65 FE) MG EC tablet Take 1 tablet (325 mg total) by mouth 2 (two) times daily.   furosemide 20 MG tablet Commonly known as: LASIX Take 2 tablets (40 mg total) by mouth daily.   levalbuterol 0.63 MG/3ML nebulizer solution Commonly known as: XOPENEX Take 3 mLs (0.63 mg total) by nebulization 2 (two) times daily.   levothyroxine 50 MCG tablet Commonly known as: Euthyrox Take 1 tablet (50 mcg total) by mouth daily at 6 (six) AM.   mirtazapine 7.5 MG tablet Commonly known as: REMERON Take 1 tablet (7.5 mg total) by mouth at bedtime.   multivitamin with minerals Tabs tablet Take 1 tablet by mouth daily.   pantoprazole 20 MG tablet Commonly known as: PROTONIX Take 1 tablet (20 mg total) by mouth 2 (two) times daily before a meal.   senna-docusate 8.6-50 MG tablet Commonly known as:  Senokot-S Take 1 tablet by mouth 2 (two) times daily between meals as needed for mild constipation.   Xarelto 15 MG Tabs tablet Generic drug: Rivaroxaban Take 1 tablet (15 mg total) by mouth daily with supper.               Durable Medical Equipment  (From admission, onward)           Start     Ordered   02/20/21 1137  For home use only DME Nebulizer machine  Once       Question Answer Comment  Patient needs a nebulizer to treat with the following condition Dyspnea   Length of Need 6 Months      02/20/21 1136            Vitals:   02/20/21 0717 02/20/21 0857  BP:  (!) 120/52  Pulse:  71  Resp:  18  Temp:  98.6 F (37 C)  SpO2: 95% 93%    Skin clean, dry and intact without evidence of skin break down, no evidence of skin tears noted. IV catheter discontinued intact. Site without signs and symptoms of complications. Dressing and pressure applied. Pt denies pain at this time. No complaints noted.  An After Visit Summary was printed and given to the patient. Patient escorted via [place], and D/C home via private auto.  [place] C. Deatra Ina

## 2021-02-20 NOTE — Plan of Care (Signed)
Patient is being discharged. Paperwork was reviewed with the patient and her family, all questions were answered. PIV was removed. No other concerns at this time. Patient to be taken downstairs via wheelchair momentarily.

## 2021-02-20 NOTE — Plan of Care (Signed)
  Problem: Activity: Goal: Ability to tolerate increased activity will improve Outcome: Progressing   Problem: Clinical Measurements: Goal: Ability to maintain a body temperature in the normal range will improve Outcome: Progressing   Problem: Respiratory: Goal: Ability to maintain adequate ventilation will improve Outcome: Progressing Goal: Ability to maintain a clear airway will improve Outcome: Progressing   

## 2021-02-20 NOTE — Progress Notes (Signed)
[facility] Cardiology  SUBJECTIVE: Patient sitting on side of the bed, denies chest pain, reports intermittent shortness of breath with exertion   Vitals:   02/19/21 2009 02/20/21 0347 02/20/21 0717 02/20/21 0857  BP: (!) 141/50 (!) 134/57  (!) 120/52  Pulse: 63 72  71  Resp: 20 20  18   Temp: 97.8 F (36.6 C) 98.1 F (36.7 C)  98.6 F (37 C)  TempSrc: Oral Oral  Oral  SpO2: 100% 97% 95% 93%  Weight:      Height:         Intake/Output Summary (Last 24 hours) at 02/20/2021 1149 Last data filed at 02/19/2021 2108 Gross per 24 hour  Intake 600 ml  Output 0 ml  Net 600 ml      PHYSICAL EXAM  General: Well developed, well nourished, in no acute distress HEENT:  Normocephalic and atramatic Neck:  No JVD.  Lungs: Clear bilaterally to auscultation and percussion. Heart: HRRR . Normal S1 and S2 without gallops or murmurs.  Abdomen: Bowel sounds are positive, abdomen soft and non-tender  Msk:  Back normal, normal gait. Normal strength and tone for age. Extremities: No clubbing, cyanosis or edema.   Neuro: Alert and oriented X 3. Psych:  Good affect, responds appropriately   LABS: Basic Metabolic Panel: Recent Labs    02/19/21 0447 02/20/21 0627  NA 138 137  K 3.7 3.9  CL 106 105  CO2 24 25  GLUCOSE 81 86  BUN 20 15  CREATININE 1.15* 1.03*  CALCIUM 8.8* 8.9  MG 2.2 2.1  PHOS  --  3.9   Liver Function Tests: No results for input(s): AST, ALT, ALKPHOS, BILITOT, PROT, ALBUMIN in the last 72 hours. No results for input(s): LIPASE, AMYLASE in the last 72 hours. CBC: Recent Labs    02/19/21 0447 02/20/21 0627  WBC 5.4 4.7  NEUTROABS 3.2  --   HGB 9.6* 9.3*  HCT 31.5* 30.1*  MCV 88.2 85.3  PLT 412* 369   Cardiac Enzymes: No results for input(s): CKTOTAL, CKMB, CKMBINDEX, TROPONINI in the last 72 hours. BNP: Invalid input(s): POCBNP D-Dimer: No results for input(s): DDIMER in the last 72 hours. Hemoglobin A1C: No results for input(s): HGBA1C in the last 72  hours. Fasting Lipid Panel: No results for input(s): CHOL, HDL, LDLCALC, TRIG, CHOLHDL, LDLDIRECT in the last 72 hours. Thyroid Function Tests: No results for input(s): TSH, T4TOTAL, T3FREE, THYROIDAB in the last 72 hours.  Invalid input(s): FREET3 Anemia Panel: No results for input(s): VITAMINB12, FOLATE, FERRITIN, TIBC, IRON, RETICCTPCT in the last 72 hours.  No results found.   Echo LVEF 40 to 45%, moderate to severe mitral regurgitation, 11/01/2020  TELEMETRY: Sinus rhythm at 67 bpm:  ASSESSMENT AND PLAN:  Principal Problem:   Pneumonia Active Problems:   CAP (community acquired pneumonia)   Acute decompensated heart failure ([place])    1.  Paroxysmal atrial fibrillation, on Xarelto for stroke prevention, on amiodarone for rhythm control, currently in sinus rhythm 2.  Chronic systolic congestive heart failure, HFrEF (LVEF 40 to 45% 11/01/2020), appears euvolemic 3.  Moderate to severe mitral regurgitation  Recommendations  1.  Agree with current therapy 2.  Continue Xarelto for stroke prevention 3.  Continue amiodarone for rhythm control 4.  Defer further cardiac diagnostics at this time    Isaias Cowman, MD, PhD, [facility] 02/20/2021 11:49 AM

## 2021-02-20 NOTE — Discharge Summary (Addendum)
Physician Discharge Summary   Frances Kent  female DOB: [birth year]  CLE:[id number]  PCP: [name], MD  Admit date: 02/15/2021 Discharge date: 02/20/2021  Admitted From: home Disposition:  home Daughter updated at bedside prior to discharge.  Home Health: Yes CODE STATUS: Limited code  Discharge Instructions     No wound care   Complete by: As directed         Hospital Course:  For full details, please see H&P, progress notes, consult notes and ancillary notes.  Briefly,  Frances Kent is a 88 year old Caucasian female history significant for hypertension, hyperlipidemia, CVA, GERD, hypothyroid, depression, atrial fibrillation/PE on Xarelto, mitral valve prolapse, CHF with EF 40 to 45% recently admitted and treated for pneumonia on 12/20, discharged on 12/28.  Doing well until the day prior to presentation when patient started having worsening dyspnea with associated tachypnea.   Acute hypoxic respiratory failure, ruled out --no documented hypoxia.   Dyspnea  PNA, ruled out --Broad spectrum abx started on presentation, but not continued.  Pt was already treated for PNA with a course of broad spectrum abx recently during last hospitalization, not strong evidence of active bacterial PNA.  No obvious pulm edema or fluid overload.  Daughter noted dyspnea happened after eating a meal, however, SLP eval found no aspiration. --pulm consulted, with High suspicion for reflux with silent aspiration, started on PPI BID. --Cardiology consult to assess for possible amiodarone toxicity and decision made to continue amiodarone.  --pt was discharged on protonix 20 mg BID. --started on Xopenex 0.63 mg twice daily followed by Pulmicort 0.25 mg twice daily, per pulm rec, and discharged on those nebs.   Chronic systolic congestive heart failure Mitral regurg Per patient's daughter patient has not been taking Lasix daily. Has not taken several days before admission due to lack of  weight gain and no swelling of lower extremities BNP elevated, but improved from prior.  No strong evidence of acute exacerbation. --attempts with trial IV lasix results in Cr increase the very next day. --resumed home lasix 40 mg daily   PE, ruled out Mild possible filling defect noted on CT.  PE unlikely given adherence to Xarelto. VQ scan ruled out PE   Atrial fibrillation, paroxysmal Rate controlled --cont home amiodarone, per cardio --cont home Xarelto   Hypothyroidism --cont home Synthroid   Anxiety --cont home Xanax PRN   Discharge Diagnoses:  Principal Problem:   Pneumonia Active Problems:   CAP (community acquired pneumonia)   Acute decompensated heart failure ([place])   30 Day Unplanned Readmission Risk Score    Flowsheet Row ED to Hosp-Admission (Current) from 02/15/2021 in [place]  30 Day Unplanned Readmission Risk Score (%) 35.53 Filed at 02/20/2021 0800       This score is the patient's risk of an unplanned readmission within 30 days of being discharged (0 -100%). The score is based on dignosis, age, lab data, medications, orders, and past utilization.   Low:  0-14.9   Medium: 15-21.9   High: 22-29.9   Extreme: 30 and above         Discharge Instructions:  Allergies as of 02/20/2021       Reactions   Statins    Severe myalgias        Medication List     TAKE these medications    acetaminophen 500 MG tablet Commonly known as: TYLENOL Take 500 mg by mouth every 6 (six) hours as needed.  ALPRAZolam 0.25 MG tablet Commonly known as: XANAX Take 1 tablet (0.25 mg total) by mouth at bedtime as needed for anxiety.   amiodarone 200 MG tablet Commonly known as: PACERONE Take 0.5 tablets (100 mg total) by mouth 2 (two) times daily. Home med. What changed:  when to take this additional instructions   budesonide 0.25 MG/2ML nebulizer solution Commonly known as: PULMICORT Take 2 mLs (0.25 mg total) by  nebulization 2 (two) times daily.   feeding supplement Liqd Take 237 mLs by mouth 3 (three) times daily between meals.   ferrous sulfate 325 (65 FE) MG EC tablet Take 1 tablet (325 mg total) by mouth 2 (two) times daily.   furosemide 20 MG tablet Commonly known as: LASIX Take 2 tablets (40 mg total) by mouth daily.   levalbuterol 0.63 MG/3ML nebulizer solution Commonly known as: XOPENEX Take 3 mLs (0.63 mg total) by nebulization 2 (two) times daily.   levothyroxine 50 MCG tablet Commonly known as: Euthyrox Take 1 tablet (50 mcg total) by mouth daily at 6 (six) AM.   mirtazapine 7.5 MG tablet Commonly known as: REMERON Take 1 tablet (7.5 mg total) by mouth at bedtime.   multivitamin with minerals Tabs tablet Take 1 tablet by mouth daily.   pantoprazole 20 MG tablet Commonly known as: PROTONIX Take 1 tablet (20 mg total) by mouth 2 (two) times daily before a meal.   senna-docusate 8.6-50 MG tablet Commonly known as: Senokot-S Take 1 tablet by mouth 2 (two) times daily between meals as needed for mild constipation.   Xarelto 15 MG Tabs tablet Generic drug: Rivaroxaban Take 1 tablet (15 mg total) by mouth daily with supper.         Follow-up Information     [name], MD Follow up in 1 week(s).   Specialty: Internal Medicine Contact information: [place] [address] [phone]         Tyler Pita, MD Follow up in 2 week(s).   Specialty: Pulmonary Disease Contact information: [place] [address] [phone]                 Allergies  Allergen Reactions   Statins     Severe myalgias     The results of significant diagnostics from this hospitalization (including imaging, microbiology, ancillary and laboratory) are listed below for reference.   Consultations:   Procedures/Studies: DG Chest 2 View  Result Date: 02/14/2021 CLINICAL DATA:  Shortness of breath. EXAM:  CHEST - 2 VIEW COMPARISON:  February 08, 2021. FINDINGS: Stable cardiomediastinal silhouette. Mild bibasilar subsegmental atelectasis is noted. Minimal pleural effusions may be present. Bony thorax is unremarkable. IMPRESSION: Mild bibasilar subsegmental atelectasis with minimal pleural effusions. Electronically Signed   By: Marijo Conception M.D.   On: 02/14/2021 19:09   CT Angio Chest PE W and/or Wo Contrast  Result Date: 02/15/2021 CLINICAL DATA:  Shortness of breath and labored breathing. EXAM: CT ANGIOGRAPHY CHEST WITH CONTRAST TECHNIQUE: Multidetector CT imaging of the chest was performed using the standard protocol during bolus administration of intravenous contrast. Multiplanar CT image reconstructions and MIPs were obtained to evaluate the vascular anatomy. CONTRAST:  44mL OMNIPAQUE IOHEXOL 350 MG/ML SOLN COMPARISON:  October 05, 2020 FINDINGS: Cardiovascular: There is mild calcification of the aortic arch, without evidence of aortic aneurysm. A tiny (2.8 mm) focus of intraluminal low attenuation is seen involving and anterior lower lobe branch of the left pulmonary artery (axial CT  image 169, CT series 5). There is mild cardiomegaly. No pericardial effusion. Mediastinum/Nodes: Mild pretracheal lymphadenopathy is noted. Thyroid gland, trachea, and esophagus demonstrate no significant findings. Lungs/Pleura: Moderate severity areas of scarring and/or atelectasis are seen within the bilateral apices with mild posterior left basilar atelectasis. Moderate severity infiltrate is seen within the posterolateral aspect of the right lower lobe. A very small left pleural effusion is present. No pneumothorax is identified. Upper Abdomen: Multiple surgical clips are seen within the gallbladder fossa. Musculoskeletal: No chest wall abnormality. No acute or significant osseous findings. Review of the MIP images confirms the above findings. IMPRESSION: 1. Tiny focus of low attenuation along a lower lobe branch of the left  pulmonary artery which may represent a small amount of artifact. Correlation with follow-up chest CTA or nuclear medicine ventilation/perfusion scan is recommended if pulmonary embolism remains of clinical concern. 2. Moderate severity posterolateral right lower lobe infiltrate infiltrate. 3. Very small left pleural effusion. 4. Moderate severity areas of scarring and/or atelectasis are seen within the bilateral apices with mild posterior left basilar atelectasis. 5. Mild cardiomegaly. 6. Aortic atherosclerosis. Aortic Atherosclerosis (ICD10-I70.0). Electronically Signed   By: Virgina Norfolk M.D.   On: 02/15/2021 03:09   NM Pulmonary Perfusion  Result Date: 02/16/2021 CLINICAL DATA:  PE suspected EXAM: NUCLEAR MEDICINE PERFUSION LUNG SCAN TECHNIQUE: Perfusion images were obtained in multiple projections after intravenous injection of radiopharmaceutical. Ventilation scans intentionally deferred if perfusion scan and chest x-ray adequate for interpretation during COVID 19 epidemic. RADIOPHARMACEUTICALS:  4.5 mCi Tc-39m MAA IV COMPARISON:  Same day chest radiograph, CT angiogram 02/15/2021 FINDINGS: Normal, homogeneous pulmonary perfusion. No suspicious perfusion defects. Cardiomegaly. IMPRESSION: Very low probability examination for pulmonary embolism by modified perfusion only PIOPED criteria (PE absent). Electronically Signed   By: Delanna Ahmadi M.D.   On: 02/16/2021 13:11   DG Chest Port 1 View  Result Date: 02/16/2021 CLINICAL DATA:  Hypoxia EXAM: PORTABLE CHEST 1 VIEW COMPARISON:  Two-view chest x-ray 02/14/2021.  CTA chest 02/15/2021. FINDINGS: Heart is enlarged. Atherosclerotic changes are noted at the arch. Asymmetric right lower lobe airspace disease remains. Chronic interstitial changes noted. IMPRESSION: 1. Persistent right lower lobe airspace disease concerning for pneumonia. 2. Cardiomegaly without failure. 3. Chronic interstitial changes. Electronically Signed   By: San Morelle M.D.    On: 02/16/2021 08:41   DG Chest Port 1 View  Result Date: 02/08/2021 CLINICAL DATA:  Shortness of breath. EXAM: PORTABLE CHEST 1 VIEW COMPARISON:  02/05/2021 FINDINGS: 0906 hours. The cardio pericardial silhouette is enlarged. Vascular congestion with diffuse interstitial opacity is similar to prior. Interval improvement in right base airspace disease. The visualized bony structures of the thorax show no acute abnormality. Telemetry leads overlie the chest. IMPRESSION: Interval improvement in right base airspace disease. Electronically Signed   By: Misty Stanley M.D.   On: 02/08/2021 09:59   DG Chest Port 1 View  Result Date: 02/05/2021 CLINICAL DATA:  Shortness of breath. EXAM: PORTABLE CHEST 1 VIEW COMPARISON:  Chest x-ray dated February 02, 2021. FINDINGS: Stable cardiomegaly. Patchy consolidation in the right lower lobe has mildly improved. Unchanged small right pleural effusion. No pneumothorax. No acute osseous abnormality. IMPRESSION: 1. Mildly improved right lower lobe pneumonia. Electronically Signed   By: Titus Dubin M.D.   On: 02/05/2021 08:09   DG Chest Portable 1 View  Result Date: 02/02/2021 CLINICAL DATA:  Shortness of breath in a female at age 13. EXAM: PORTABLE CHEST 1 VIEW COMPARISON:  January 12, 2021. FINDINGS: EKG leads  project over the chest. Trachea midline. Heart size is enlarged as before. Graded opacity suggested at the RIGHT lung base. Marked increased density about the RIGHT mid chest compared to the previous exam and scattered multifocal opacities including RIGHT lower lobe in the RIGHT retrocardiac region. Mild increased interstitial prominence in the LEFT chest. No visible pneumothorax. On limited assessment there is no acute skeletal process. IMPRESSION: Findings suspicious for multifocal pneumonia in the RIGHT chest potentially associated with RIGHT-sided effusion. Cardiomegaly with signs of vascular congestion. Also correlate with any signs of heart failure.  Electronically Signed   By: Zetta Bills M.D.   On: 02/02/2021 10:03      Labs: BNP (last 3 results) Recent Labs    01/15/21 0549 02/02/21 0926 02/14/21 1832  BNP 502.7* 798.7* 993.7*   Basic Metabolic Panel: Recent Labs  Lab 02/15/21 0500 02/16/21 0408 02/17/21 1104 02/18/21 0412 02/19/21 0447 02/20/21 0627  NA 139  --  135 139 138 137  K 4.1  --  3.8 3.8 3.7 3.9  CL 109  --  102 107 106 105  CO2 24  --  25 25 24 25   GLUCOSE 90  --  112* 83 81 86  BUN 13  --  18 17 20 15   CREATININE 0.99 1.03* 1.08* 0.94 1.15* 1.03*  CALCIUM 8.7*  --  9.0 8.7* 8.8* 8.9  MG  --   --  2.2 2.2 2.2 2.1  PHOS  --   --   --   --   --  3.9   Liver Function Tests: Recent Labs  Lab 02/14/21 1832  AST 27  ALT 25  ALKPHOS 100  BILITOT 0.6  PROT 6.5  ALBUMIN 2.9*   No results for input(s): LIPASE, AMYLASE in the last 168 hours. No results for input(s): AMMONIA in the last 168 hours. CBC: Recent Labs  Lab 02/14/21 1832 02/15/21 0500 02/17/21 1104 02/18/21 0412 02/19/21 0447 02/20/21 0627  WBC 8.4 8.6 6.7 5.2 5.4 4.7  NEUTROABS 5.5  --   --   --  3.2  --   HGB 10.2* 9.6* 10.0* 9.6* 9.6* 9.3*  HCT 33.5* 31.7* 33.1* 30.4* 31.5* 30.1*  MCV 87.0 85.9 87.3 84.9 88.2 85.3  PLT 492* 447* 459* 433* 412* 369   Cardiac Enzymes: No results for input(s): CKTOTAL, CKMB, CKMBINDEX, TROPONINI in the last 168 hours. BNP: Invalid input(s): POCBNP CBG: No results for input(s): GLUCAP in the last 168 hours. D-Dimer No results for input(s): DDIMER in the last 72 hours. Hgb A1c No results for input(s): HGBA1C in the last 72 hours. Lipid Profile No results for input(s): CHOL, HDL, LDLCALC, TRIG, CHOLHDL, LDLDIRECT in the last 72 hours. Thyroid function studies No results for input(s): TSH, T4TOTAL, T3FREE, THYROIDAB in the last 72 hours.  Invalid input(s): FREET3 Anemia work up No results for input(s): VITAMINB12, FOLATE, FERRITIN, TIBC, IRON, RETICCTPCT in the last 72  hours. Urinalysis    Component Value Date/Time   COLORURINE YELLOW (A) 09/08/2020 0105   APPEARANCEUR HAZY (A) 09/08/2020 0105   APPEARANCEUR Clear 02/05/2013 1630   LABSPEC 1.024 09/08/2020 0105   LABSPEC 1.009 02/05/2013 1630   PHURINE 6.0 09/08/2020 0105   GLUCOSEU NEGATIVE 09/08/2020 0105   GLUCOSEU NEGATIVE 04/04/2018 1149   HGBUR NEGATIVE 09/08/2020 0105   BILIRUBINUR NEGATIVE 09/08/2020 0105   BILIRUBINUR 1 08/31/2017 1157   BILIRUBINUR Negative 02/05/2013 1630   KETONESUR 5 (A) 09/08/2020 0105   PROTEINUR 100 (A) 09/08/2020 0105   UROBILINOGEN 1.0  04/04/2018 1149   NITRITE NEGATIVE 09/08/2020 0105   LEUKOCYTESUR NEGATIVE 09/08/2020 0105   LEUKOCYTESUR 1+ 02/05/2013 1630   Sepsis Labs Invalid input(s): PROCALCITONIN,  WBC,  LACTICIDVEN Microbiology Recent Results (from the past 240 hour(s))  Resp Panel by RT-PCR (Flu A&B, Covid) Nasopharyngeal Swab     Status: None   Collection Time: 02/14/21  7:43 PM   Specimen: Nasopharyngeal Swab; Nasopharyngeal(NP) swabs in vial transport medium  Result Value Ref Range Status   SARS Coronavirus 2 by RT PCR NEGATIVE NEGATIVE Final    Comment: (NOTE) SARS-CoV-2 target nucleic acids are NOT DETECTED.  The SARS-CoV-2 RNA is generally detectable in upper respiratory specimens during the acute phase of infection. The lowest concentration of SARS-CoV-2 viral copies this assay can detect is 138 copies/mL. A negative result does not preclude SARS-Cov-2 infection and should not be used as the sole basis for treatment or other patient management decisions. A negative result may occur with  improper specimen collection/handling, submission of specimen other than nasopharyngeal swab, presence of viral mutation(s) within the areas targeted by this assay, and inadequate number of viral copies(<138 copies/mL). A negative result must be combined with clinical observations, patient history, and epidemiological information. The expected result  is Negative.  Fact Sheet for Patients:  EntrepreneurPulse.com.au  Fact Sheet for Healthcare Providers:  IncredibleEmployment.be  This test is no t yet approved or cleared by the [place] FDA and  has been authorized for detection and/or diagnosis of SARS-CoV-2 by FDA under an Emergency Use Authorization (EUA). This EUA will remain  in effect (meaning this test can be used) for the duration of the COVID-19 declaration under Section 564(b)(1) of the Act, 21 U.S.C.section 360bbb-3(b)(1), unless the authorization is terminated  or revoked sooner.       Influenza A by PCR NEGATIVE NEGATIVE Final   Influenza B by PCR NEGATIVE NEGATIVE Final    Comment: (NOTE) The Xpert Xpress SARS-CoV-2/FLU/RSV plus assay is intended as an aid in the diagnosis of influenza from Nasopharyngeal swab specimens and should not be used as a sole basis for treatment. Nasal washings and aspirates are unacceptable for Xpert Xpress SARS-CoV-2/FLU/RSV testing.  Fact Sheet for Patients: EntrepreneurPulse.com.au  Fact Sheet for Healthcare Providers: IncredibleEmployment.be  This test is not yet approved or cleared by the [place] FDA and has been authorized for detection and/or diagnosis of SARS-CoV-2 by FDA under an Emergency Use Authorization (EUA). This EUA will remain in effect (meaning this test can be used) for the duration of the COVID-19 declaration under Section 564(b)(1) of the Act, 21 U.S.C. section 360bbb-3(b)(1), unless the authorization is terminated or revoked.  Performed at [facility], [address] [place], [place] [address]   Blood culture (single)     Status: None   Collection Time: 02/15/21  4:08 AM   Specimen: Right Antecubital; Blood  Result Value Ref Range Status   Specimen Description RIGHT ANTECUBITAL  Final   Special Requests   Final    BOTTLES DRAWN AEROBIC AND ANAEROBIC Blood  Culture results may not be optimal due to an excessive volume of blood received in culture bottles   Culture   Final    NO GROWTH 5 DAYS Performed at [facility], [address], [place], [place] [address]    Report Status 02/20/2021 FINAL  Final  MRSA Next Gen by PCR, Nasal     Status: None   Collection Time: 02/15/21  4:40 AM   Specimen: Nasal Mucosa; Nasal Swab  Result  Value Ref Range Status   MRSA by PCR Next Gen NOT DETECTED NOT DETECTED Final    Comment: (NOTE) The GeneXpert MRSA Assay (FDA approved for NASAL specimens only), is one component of a comprehensive MRSA colonization surveillance program. It is not intended to diagnose MRSA infection nor to guide or monitor treatment for MRSA infections. Test performance is not FDA approved in patients less than 70 years old. Performed at [facility], [address] [place], [place] [address]   Culture, blood (Routine X 2) w Reflex to ID Panel     Status: None (Preliminary result)   Collection Time: 02/15/21 11:45 PM   Specimen: BLOOD  Result Value Ref Range Status   Specimen Description BLOOD RIGHT WRIST  Final   Special Requests   Final    BOTTLES DRAWN AEROBIC AND ANAEROBIC Blood Culture adequate volume   Culture   Final    NO GROWTH 4 DAYS Performed at [facility], [address], [place], [place] [address]    Report Status PENDING  Incomplete  Culture, blood (Routine X 2) w Reflex to ID Panel     Status: None (Preliminary result)   Collection Time: 02/15/21 11:45 PM   Specimen: BLOOD  Result Value Ref Range Status   Specimen Description BLOOD RIGHT HAND  Final   Special Requests IN PEDIATRIC BOTTLE Blood Culture adequate volume  Final   Culture   Final    NO GROWTH 4 DAYS Performed at [facility], [address], [place], [place] [address]    Report Status PENDING  Incomplete     Total time spend on discharging this patient, including the last patient exam, discussing  the hospital stay, instructions for ongoing care as it relates to all pertinent caregivers, as well as preparing the medical discharge records, prescriptions, and/or referrals as applicable, is 35 minutes.    Enzo Bi, MD  [facility] 02/20/2021, 9:17 AM

## 2021-02-21 LAB — CULTURE, BLOOD (ROUTINE X 2)
Culture: NO GROWTH
Culture: NO GROWTH
Special Requests: ADEQUATE
Special Requests: ADEQUATE

## 2021-02-22 ENCOUNTER — Telehealth: Payer: Self-pay

## 2021-02-22 ENCOUNTER — Telehealth: Payer: Self-pay | Admitting: Internal Medicine

## 2021-02-22 NOTE — Telephone Encounter (Signed)
Transition Care Management Unsuccessful Follow-up Telephone Call  Date of discharge and from where:  02/20/21 [facility]  Attempts:  1st Attempt  Reason for unsuccessful TCM follow-up call:  Unable to leave message. HFU scheduled 02/23/21 @ 2:00. Keep all scheduled appointments.

## 2021-02-22 NOTE — Telephone Encounter (Signed)
Access Nurse called back. Patient needs information on how to take her medications form hospital.

## 2021-02-22 NOTE — Telephone Encounter (Signed)
Patient daughter Kathrin Ruddy in stated that patient just got out the hospital on Saturday and the patient was prescribe a new medication Protonix 20MG   , Patient daughter has some questions the directions of taking the medications , please call patient daughter @ [phone]. Patient daughter also was  Clinical biochemist

## 2021-02-23 ENCOUNTER — Ambulatory Visit (INDEPENDENT_AMBULATORY_CARE_PROVIDER_SITE_OTHER): Payer: Medicare HMO | Admitting: Internal Medicine

## 2021-02-23 ENCOUNTER — Encounter: Payer: Self-pay | Admitting: Internal Medicine

## 2021-02-23 ENCOUNTER — Other Ambulatory Visit: Payer: Self-pay

## 2021-02-23 VITALS — BP 138/62 | HR 66 | Temp 97.9°F | Ht 63.0 in | Wt 104.0 lb

## 2021-02-23 DIAGNOSIS — M549 Dorsalgia, unspecified: Secondary | ICD-10-CM | POA: Insufficient documentation

## 2021-02-23 DIAGNOSIS — M546 Pain in thoracic spine: Secondary | ICD-10-CM

## 2021-02-23 DIAGNOSIS — Z09 Encounter for follow-up examination after completed treatment for conditions other than malignant neoplasm: Secondary | ICD-10-CM | POA: Diagnosis not present

## 2021-02-23 DIAGNOSIS — D509 Iron deficiency anemia, unspecified: Secondary | ICD-10-CM | POA: Diagnosis not present

## 2021-02-23 DIAGNOSIS — M8008XS Age-related osteoporosis with current pathological fracture, vertebra(e), sequela: Secondary | ICD-10-CM | POA: Insufficient documentation

## 2021-02-23 DIAGNOSIS — F411 Generalized anxiety disorder: Secondary | ICD-10-CM | POA: Diagnosis not present

## 2021-02-23 DIAGNOSIS — I5022 Chronic systolic (congestive) heart failure: Secondary | ICD-10-CM

## 2021-02-23 DIAGNOSIS — J189 Pneumonia, unspecified organism: Secondary | ICD-10-CM

## 2021-02-23 DIAGNOSIS — K2100 Gastro-esophageal reflux disease with esophagitis, without bleeding: Secondary | ICD-10-CM | POA: Diagnosis not present

## 2021-02-23 DIAGNOSIS — R634 Abnormal weight loss: Secondary | ICD-10-CM | POA: Diagnosis not present

## 2021-02-23 DIAGNOSIS — N1831 Chronic kidney disease, stage 3a: Secondary | ICD-10-CM | POA: Diagnosis not present

## 2021-02-23 MED ORDER — ALPRAZOLAM 0.25 MG PO TABS: 0.1250 mg | ORAL_TABLET | Freq: Two times a day (BID) | ORAL | 5 refills | Status: DC | PRN

## 2021-02-23 NOTE — Patient Instructions (Addendum)
Continue 20 mg of furosemide daily, NOT 40 MG  Ok to take 1 potassium daily   You MUST EAT!   Go for a walk after eating to help stomach empty (hiatal hernia)    Your lungs are working fine! It's your anxiety that is making you feel bad  Ok to use 1/2  alprazolam during the day for anxiety and 1/2 tablet at bedtime   RETURN FOR LABS ON JAN 23

## 2021-02-23 NOTE — Assessment & Plan Note (Signed)
Improved symptoms with protonix started during hospitalization

## 2021-02-23 NOTE — Assessment & Plan Note (Signed)
Renal function is stable.  Lab Results  Component Value Date   CREATININE 1.03 (H) 02/20/2021

## 2021-02-23 NOTE — Assessment & Plan Note (Signed)
Patient is stable post discharge and has no new issues or questions about discharge plans at the visit today for hospital follow up. All labs , imaging studies and progress notes from admission were reviewed with patient today   

## 2021-02-23 NOTE — Assessment & Plan Note (Signed)
She is nontender on exam.  Her area of pain is the RLL

## 2021-02-23 NOTE — Assessment & Plan Note (Addendum)
Appears to be due to chronic disease.  Frances Kent  Hemoccult testing during admission was negative.  She was mildly anemic prior to initiation of Xarelto in October,  She has new onset CKD. Frances Kent She was referred to heme onc but missed both appts in December

## 2021-02-23 NOTE — Assessment & Plan Note (Signed)
Her anxiety is significantly affecting her outlook and recover.  Continue alprazolam and mirtazapine

## 2021-02-23 NOTE — Assessment & Plan Note (Signed)
Treated with cefipime and doxycycline in house  RLL .  Cough has largely resolved and she is satting  98 to  100% ; lung fields are clear. Follow up with pulmonary

## 2021-02-23 NOTE — Progress Notes (Signed)
Subjective:  Patient ID: Frances Kent, female    DOB: [birth year]  Age: 88 y.o. MRN: [id number]  CC: hospital follow up   This visit occurred during the SARS-CoV-2 public health emergency.  Safety protocols were in place, including screening questions prior to the visit, additional usage of staff PPE, and extensive cleaning of exam room while observing appropriate contact time as indicated for disinfecting solutions.    HPI Frances Kent presents for  Chief Complaint  Patient presents with   Hospitalization Follow-up    Pt was admitted to the hospital for weakness   Frances Kent is an 88 yr old female with atrial fibrillation , recent  development of failure to thrive, was admitted to [facility] on Jan 2 with profound weakness and  severe RLL pneuumonia.   She was treated In house with dual antibiotics and discharged on Jan 7.  She is accompanied by her daughter today  She reports that she doesn't feel good.  She has right sided lower back pain that has been present since er admission.  Had a minor fall prior to admission.  Also sill having RLL pain . She frequently Gets short of breath after eating so she has been avoiding .  Family has repeatedly checked  her O2 sats during episodes of dyspnea and readings have been 96 or higher. She feels better after takig 0.125 mg alprazolam .  The mirtazapine is helping her appetite and her insomnia, but she continues to require 1/2 alprazolam tablet at bedtime as well.  .  Now taking protonix  for GERD ,    Outpatient Medications Prior to Visit  Medication Sig Dispense Refill   acetaminophen (TYLENOL) 500 MG tablet Take 500 mg by mouth every 6 (six) hours as needed.     amiodarone (PACERONE) 200 MG tablet Take 0.5 tablets (100 mg total) by mouth 2 (two) times daily. Home med.     budesonide (PULMICORT) 0.25 MG/2ML nebulizer solution Take 2 mLs (0.25 mg total) by nebulization 2 (two) times daily. 120 mL 2   feeding supplement (ENSURE ENLIVE / ENSURE PLUS)  LIQD Take 237 mLs by mouth 3 (three) times daily between meals. 237 mL 12   ferrous sulfate 325 (65 FE) MG EC tablet Take 1 tablet (325 mg total) by mouth 2 (two) times daily. 180 tablet 1   furosemide (LASIX) 20 MG tablet Take 2 tablets (40 mg total) by mouth daily. 90 tablet 1   levalbuterol (XOPENEX) 0.63 MG/3ML nebulizer solution Take 3 mLs (0.63 mg total) by nebulization 2 (two) times daily. 180 mL 2   levothyroxine (EUTHYROX) 50 MCG tablet Take 1 tablet (50 mcg total) by mouth daily at 6 (six) AM. 30 tablet 2   mirtazapine (REMERON) 7.5 MG tablet Take 1 tablet (7.5 mg total) by mouth at bedtime. 30 tablet 2   Multiple Vitamin (MULTIVITAMIN WITH MINERALS) TABS tablet Take 1 tablet by mouth daily.     pantoprazole (PROTONIX) 20 MG tablet Take 1 tablet (20 mg total) by mouth 2 (two) times daily before a meal. 180 tablet 0   Rivaroxaban (XARELTO) 15 MG TABS tablet Take 1 tablet (15 mg total) by mouth daily with supper. 30 tablet 0   senna-docusate (SENOKOT-S) 8.6-50 MG tablet Take 1 tablet by mouth 2 (two) times daily between meals as needed for mild constipation. 60 tablet 0   ALPRAZolam (XANAX) 0.25 MG tablet Take 1 tablet (0.25 mg total) by mouth at bedtime as needed for anxiety. 20 tablet 0  No facility-administered medications prior to visit.    Review of Systems;  Patient denies headache, fevers, malaise, unintentional weight loss, skin rash, eye pain, sinus congestion and sinus pain, sore throat, dysphagia,  hemoptysis , cough, dyspnea, wheezing, chest pain, palpitations, orthopnea, edema, abdominal pain, nausea, melena, diarrhea, constipation, flank pain, dysuria, hematuria, urinary  Frequency, nocturia, numbness, tingling, seizures,  Focal weakness, Loss of consciousness,  Tremor, insomnia, depression, anxiety, and suicidal ideation.      Objective:  BP 138/62 (BP Location: Left Arm, Patient Position: Sitting, Cuff Size: Normal)    Pulse 66    Temp 97.9 F (36.6 C) (Oral)    Ht 5'  3" (1.6 m)    Wt 104 lb (47.2 kg)    SpO2 97%    BMI 18.42 kg/m   BP Readings from Last 3 Encounters:  02/23/21 138/62  02/20/21 (!) 120/52  02/10/21 124/62    Wt Readings from Last 3 Encounters:  02/23/21 104 lb (47.2 kg)  02/14/21 100 lb (45.4 kg)  02/10/21 108 lb 14.5 oz (49.4 kg)    General appearance: alert, cooperative and appears stated age Ears: normal TM's and external ear canals both ears Throat: lips, mucosa, and tongue normal; teeth and gums normal Neck: no adenopathy, no carotid bruit, supple, symmetrical, trachea midline and thyroid not enlarged, symmetric, no tenderness/mass/nodules Back: symmetric, no curvature. ROM normal. No CVA tenderness., no spinal tenderness  Lungs: clear to auscultation bilaterally Heart: regular rate and rhythm, S1, S2 normal, no murmur, click, rub or gallop Abdomen: soft, non-tender; bowel sounds normal; no masses,  no organomegaly Pulses: 2+ and symmetric Skin: Skin color, texture, turgor normal. No rashes or lesions Lymph nodes: Cervical, supraclavicular, and axillary nodes normal.  Lab Results  Component Value Date   HGBA1C 5.8 (H) 11/02/2020   HGBA1C 6.1 (H) 10/16/2020    Lab Results  Component Value Date   CREATININE 1.03 (H) 02/20/2021   CREATININE 1.15 (H) 02/19/2021   CREATININE 0.94 02/18/2021    Lab Results  Component Value Date   WBC 4.7 02/20/2021   HGB 9.3 (L) 02/20/2021   HCT 30.1 (L) 02/20/2021   PLT 369 02/20/2021   GLUCOSE 86 02/20/2021   CHOL 144 11/02/2020   TRIG 76 11/02/2020   TRIG 72 11/02/2020   HDL 48 11/02/2020   LDLDIRECT 185.3 03/27/2013   LDLCALC 81 11/02/2020   ALT 25 02/14/2021   AST 27 02/14/2021   NA 137 02/20/2021   K 3.9 02/20/2021   CL 105 02/20/2021   CREATININE 1.03 (H) 02/20/2021   BUN 15 02/20/2021   CO2 25 02/20/2021   TSH 4.583 (H) 01/14/2021   INR 3.0 (H) 10/15/2020   HGBA1C 5.8 (H) 11/02/2020    CT Angio Chest PE W and/or Wo Contrast  Result Date: 02/15/2021 CLINICAL  DATA:  Shortness of breath and labored breathing. EXAM: CT ANGIOGRAPHY CHEST WITH CONTRAST TECHNIQUE: Multidetector CT imaging of the chest was performed using the standard protocol during bolus administration of intravenous contrast. Multiplanar CT image reconstructions and MIPs were obtained to evaluate the vascular anatomy. CONTRAST:  35mL OMNIPAQUE IOHEXOL 350 MG/ML SOLN COMPARISON:  October 05, 2020 FINDINGS: Cardiovascular: There is mild calcification of the aortic arch, without evidence of aortic aneurysm. A tiny (2.8 mm) focus of intraluminal low attenuation is seen involving and anterior lower lobe branch of the left pulmonary artery (axial CT image 169, CT series 5). There is mild cardiomegaly. No pericardial effusion. Mediastinum/Nodes: Mild pretracheal lymphadenopathy is noted. Thyroid gland,  trachea, and esophagus demonstrate no significant findings. Lungs/Pleura: Moderate severity areas of scarring and/or atelectasis are seen within the bilateral apices with mild posterior left basilar atelectasis. Moderate severity infiltrate is seen within the posterolateral aspect of the right lower lobe. A very small left pleural effusion is present. No pneumothorax is identified. Upper Abdomen: Multiple surgical clips are seen within the gallbladder fossa. Musculoskeletal: No chest wall abnormality. No acute or significant osseous findings. Review of the MIP images confirms the above findings. IMPRESSION: 1. Tiny focus of low attenuation along a lower lobe branch of the left pulmonary artery which may represent a small amount of artifact. Correlation with follow-up chest CTA or nuclear medicine ventilation/perfusion scan is recommended if pulmonary embolism remains of clinical concern. 2. Moderate severity posterolateral right lower lobe infiltrate infiltrate. 3. Very small left pleural effusion. 4. Moderate severity areas of scarring and/or atelectasis are seen within the bilateral apices with mild posterior left  basilar atelectasis. 5. Mild cardiomegaly. 6. Aortic atherosclerosis. Aortic Atherosclerosis (ICD10-I70.0). Electronically Signed   By: Virgina Norfolk M.D.   On: 02/15/2021 03:09   NM Pulmonary Perfusion  Result Date: 02/16/2021 CLINICAL DATA:  PE suspected EXAM: NUCLEAR MEDICINE PERFUSION LUNG SCAN TECHNIQUE: Perfusion images were obtained in multiple projections after intravenous injection of radiopharmaceutical. Ventilation scans intentionally deferred if perfusion scan and chest x-ray adequate for interpretation during COVID 19 epidemic. RADIOPHARMACEUTICALS:  4.5 mCi Tc-73m MAA IV COMPARISON:  Same day chest radiograph, CT angiogram 02/15/2021 FINDINGS: Normal, homogeneous pulmonary perfusion. No suspicious perfusion defects. Cardiomegaly. IMPRESSION: Very low probability examination for pulmonary embolism by modified perfusion only PIOPED criteria (PE absent). Electronically Signed   By: Delanna Ahmadi M.D.   On: 02/16/2021 13:11   DG Chest Port 1 View  Result Date: 02/16/2021 CLINICAL DATA:  Hypoxia EXAM: PORTABLE CHEST 1 VIEW COMPARISON:  Two-view chest x-ray 02/14/2021.  CTA chest 02/15/2021. FINDINGS: Heart is enlarged. Atherosclerotic changes are noted at the arch. Asymmetric right lower lobe airspace disease remains. Chronic interstitial changes noted. IMPRESSION: 1. Persistent right lower lobe airspace disease concerning for pneumonia. 2. Cardiomegaly without failure. 3. Chronic interstitial changes. Electronically Signed   By: San Morelle M.D.   On: 02/16/2021 08:41    Assessment & Plan:   Problem List Items Addressed This Visit     GERD (gastroesophageal reflux disease)    Improved symptoms with protonix started during hospitalization      Generalized anxiety disorder    Her anxiety is significantly affecting her outlook and recover.  Continue alprazolam and mirtazapine      Relevant Medications   ALPRAZolam (XANAX) 0.25 MG tablet   Hospital discharge follow-up     Patient is stable post discharge and has no new issues or questions about discharge plans at the visit today for hospital follow up. All labs , imaging studies and progress notes from admission were reviewed with patient today        Unintentional weight loss    Secondary to chronic illness,  Hiatal hernia and anxiety.  Improving with mirtazipine       Chronic systolic CHF (congestive heart failure) ([place]) - Primary   Relevant Orders   Basic metabolic panel   Stage 3a chronic kidney disease ([place])    Renal function is stable.  Lab Results  Component Value Date   CREATININE 1.03 (H) 02/20/2021         Iron deficiency anemia    Appears to be due to chronic disease.  Marland Kitchen  Hemoccult testing  during admission was negative.  She was mildly anemic prior to initiation of Xarelto in October,  She has new onset CKD. Marland Kitchen She was referred to heme onc but missed both appts in December       CAP (community acquired pneumonia)    Treated with cefipime and doxycycline in house  RLL .  Cough has largely resolved and she is satting  98 to  100% ; lung fields are clear. Follow up with pulmonary       Acute right-sided back pain    She is nontender on exam.  Her area of pain is the RLL        I have changed Frances Kent's ALPRAZolam. I am also having her maintain her acetaminophen, multivitamin with minerals, Rivaroxaban, furosemide, ferrous sulfate, senna-docusate, mirtazapine, levothyroxine, amiodarone, pantoprazole, feeding supplement, budesonide, and levalbuterol.  Meds ordered this encounter  Medications   ALPRAZolam (XANAX) 0.25 MG tablet    Sig: Take 0.5 tablets (0.125 mg total) by mouth 2 (two) times daily as needed for anxiety.    Dispense:  30 tablet    Refill:  5     I provided  30 minutes of  face-to-face time during this encounter reviewing patient's current problems and past surgeries, labs and imaging studies, providing counseling on the above mentioned problems , and  coordination  of care .   Follow-up: Return in about 3 months (around 05/24/2021).   [name], MD

## 2021-02-23 NOTE — Assessment & Plan Note (Signed)
Secondary to chronic illness,  Hiatal hernia and anxiety.  Improving with mirtazipine

## 2021-02-23 NOTE — Telephone Encounter (Signed)
Spoke with pt's daughter and advised that pt wait at least 30 to 45 minutes after taking her Levothyroxine. Daughter gave a verbal understanding.

## 2021-03-02 ENCOUNTER — Ambulatory Visit
Admission: RE | Admit: 2021-03-02 | Discharge: 2021-03-02 | Disposition: A | Discharge status: Home / Self Care | Payer: Medicare HMO | Source: Ambulatory Visit | Attending: Pulmonary Disease | Admitting: Pulmonary Disease

## 2021-03-02 ENCOUNTER — Ambulatory Visit: Payer: Medicare HMO | Admitting: Pulmonary Disease

## 2021-03-02 ENCOUNTER — Other Ambulatory Visit: Payer: Self-pay

## 2021-03-02 ENCOUNTER — Encounter: Payer: Self-pay | Admitting: Pulmonary Disease

## 2021-03-02 VITALS — BP 152/72 | HR 59 | Temp 97.9°F | Ht 63.0 in | Wt 107.6 lb

## 2021-03-02 DIAGNOSIS — M546 Pain in thoracic spine: Secondary | ICD-10-CM

## 2021-03-02 DIAGNOSIS — J9601 Acute respiratory failure with hypoxia: Secondary | ICD-10-CM | POA: Diagnosis not present

## 2021-03-02 DIAGNOSIS — J690 Pneumonitis due to inhalation of food and vomit: Secondary | ICD-10-CM | POA: Diagnosis not present

## 2021-03-02 DIAGNOSIS — K219 Gastro-esophageal reflux disease without esophagitis: Secondary | ICD-10-CM | POA: Diagnosis not present

## 2021-03-02 NOTE — Patient Instructions (Signed)
We are getting x-rays of the spine to see if there is any need for MRI of the spine.  We will call you with the result of the x-rays and if any other instructions are needed after the x-ray is done.   Continue using your nebulizers and continue taking your Protonix   We will see him in follow-up in 3 to 4 weeks time you may see me or the nurse practitioner at that time.

## 2021-03-02 NOTE — Progress Notes (Signed)
Subjective:    Patient ID: Frances Kent, female    DOB: 1930/05/01, 88 y.o.   MRN: [id number] Chief Complaint  Patient presents with   Follow-up    HPI Frances Kent is an 88 year old lifelong never smoker who follows here after recent hospitalization.  She was admitted to [facility] on 15 February 2021 with a complaint of shortness of breath.  She had been at [facility] previously for pneumonia from 20 December through 10 February 2021.  We evaluated her during her hospitalization and it appears that she was having sudden onset of breathlessness that occurred mostly postprandially.  Her findings on imaging were consistent with aspiration events.  She was started on PPI antireflux measures and this helped tremendously.  She has not had any more episodes of breathlessness.  Her main concern currently is that of back pain that has been somewhat debilitating to her.  This is on the right side mid thoracic to lower thoracic.  Started a few days ago.  Limits her walking due to pain.  She has an extensive history of paroxysmal atrial fibrillation and has been on amiodarone and Xarelto.  Left ventricular ejection fraction is 40 to 45% she has severe MR.  She has had multiple hospitalizations over the last 4 months with issues related to her cardiac diagnoses.   Review of Systems A 10 point review of systems was performed and it is as noted above otherwise negative.  Patient Active Problem List   Diagnosis Date Noted   Acute right-sided back pain 02/23/2021   CAP (community acquired pneumonia) 02/15/2021   HCAP (healthcare-associated pneumonia) 02/02/2021   Severe sepsis ([place]) 02/02/2021   Depression with anxiety 02/02/2021   Iron deficiency anemia 01/25/2021   Symptomatic anemia 01/13/2021   History of embolic stroke 37/05/8887   Inflamed external hemorrhoid 12/08/2020   Insomnia due to anxiety and fear 12/08/2020   Long term (current) use of anticoagulants    Chronic HFrEF (heart failure with reduced  ejection fraction) ([place])    Left basal ganglia embolic stroke ([place]) 16/94/5038   Malnutrition of moderate degree ([place]) 11/03/2020   Prolonged QT interval    Acute on chronic systolic (congestive) heart failure (HCC)    Persistent atrial fibrillation ([place]) 10/30/2020   Antibiotic-associated diarrhea 10/15/2020   ILD (interstitial lung disease) ([place]) 09/22/2020   Atrial flutter, paroxysmal ([place]) 09/10/2020   Tachyarrhythmia 09/08/2020   Sick sinus syndrome ([place]) 09/08/2020   Elevated serum free T4 level 09/08/2020   Acute respiratory failure with hypoxia ([place]) 08/28/2020   Purpura senilis ([place]) 08/28/2020   Hiatal hernia 08/09/2020   Atrial fibrillation with rapid ventricular response ([place]) 08/06/2020   Thrombophilia ([place]) 08/06/2020   Elevated troponin I level 05/21/2020   CVA (cerebral vascular accident) ([place]) 03/24/2020   History of COVID-19 03/03/2020   Bilateral carotid artery stenosis 12/23/2019   Schatzki's ring of distal esophagus 12/11/2019   Erosive gastritis 11/30/2019   Dysphagia 11/27/2019   Painful swallowing 11/27/2019   Gastroesophageal reflux disease 11/27/2019   Stage 3a chronic kidney disease ([place]) 04/11/2019   Coagulopathy ([place]) 09/12/2018   Essential hypertension 88/28/0034   Chronic systolic CHF (congestive heart failure) ([place]) 08/15/2018   Hypothyroidism due to acquired atrophy of thyroid 05/08/2018   Unintentional weight loss 05/08/2018   Myalgia due to statin 05/08/2018   Abdominal aortic atherosclerosis ([place]) 05/07/2018   Bradycardia 04/28/2018   Pleural effusion on left 02/27/2018   Moderate mitral stenosis 01/10/2018   Lumbar radiculitis 01/07/2018   B12 deficiency 07/26/2017  Atrial fibrillation status post cardioversion [facility]) 04/06/2016   Hospital discharge follow-up 03/10/2016   Vitamin D deficiency 04/08/2015   Cervical spine degeneration 12/13/2014   History of pulmonary embolism 12/02/2014   Insomnia 10/04/2014   Moderate tricuspid  insufficiency 09/17/2014   Generalized anxiety disorder 03/28/2013   Mitral valve prolapse 03/28/2013   Routine adult health maintenance 03/23/2012   Fatigue 03/23/2012   Cough 04/17/2011   Screening for breast cancer 11/29/2010   Macular degeneration, left eye 11/29/2010   Screening for colon cancer 11/29/2010   Hyperlipidemia LDL goal <70 11/29/2010   GERD (gastroesophageal reflux disease)    Social History   Tobacco Use   Smoking status: Never   Smokeless tobacco: Never   Tobacco comments:    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 , worked at [employer], [employer]  Substance Use Topics   Alcohol use: No   Allergies  Allergen Reactions   Statins     Severe myalgias   Current Meds  Medication Sig   acetaminophen (TYLENOL) 500 MG tablet Take 500 mg by mouth every 6 (six) hours as needed.   ALPRAZolam (XANAX) 0.25 MG tablet Take 0.5 tablets (0.125 mg total) by mouth 2 (two) times daily as needed for anxiety.   amiodarone (PACERONE) 200 MG tablet Take 0.5 tablets (100 mg total) by mouth 2 (two) times daily. Home med.   budesonide (PULMICORT) 0.25 MG/2ML nebulizer solution Take 2 mLs (0.25 mg total) by nebulization 2 (two) times daily.   ferrous sulfate 325 (65 FE) MG EC tablet Take 1 tablet (325 mg total) by mouth 2 (two) times daily.   furosemide (LASIX) 20 MG tablet Take 2 tablets (40 mg total) by mouth daily.   levalbuterol (XOPENEX) 0.63 MG/3ML nebulizer solution Take 3 mLs (0.63 mg total) by nebulization 2 (two) times daily.   levothyroxine (EUTHYROX) 50 MCG tablet Take 1 tablet (50 mcg total) by mouth daily at 6 (six) AM.   mirtazapine (REMERON) 7.5 MG tablet Take 1 tablet (7.5 mg total) by mouth at bedtime.   Multiple Vitamin (MULTIVITAMIN WITH MINERALS) TABS tablet Take 1 tablet by mouth daily.   pantoprazole (PROTONIX) 20 MG tablet Take 1 tablet (20 mg total) by mouth 2 (two) times daily before a meal.   Rivaroxaban (XARELTO) 15 MG TABS tablet Take 1 tablet (15 mg total) by mouth daily with  supper.   senna-docusate (SENOKOT-S) 8.6-50 MG tablet Take 1 tablet by mouth 2 (two) times daily between meals as needed for mild constipation.   Immunization History  Administered Date(s) Administered   Pneumococcal Conjugate-13 12/11/2014   Pneumococcal Polysaccharide-23 07/13/2016       Objective:   Physical Exam BP (!) 152/72 (BP Location: Left Arm, Patient Position: Sitting, Cuff Size: Normal)    Pulse (!) 59    Temp 97.9 F (36.6 C) (Oral)    Ht 5\' 3"  (1.6 m)    Wt 107 lb 9.6 oz (48.8 kg)    SpO2 96%    BMI 19.06 kg/m  GENERAL: [place] elderly woman, presents in transport chair.  No tachypnea.  Sallow appearance.  Looks uncomfortable. HEAD: Normocephalic, atraumatic.  EYES: Pupils equal, round, reactive to light.  No scleral icterus.  MOUTH: Oral mucosa moist. NECK: Supple. No thyromegaly. Trachea midline. No JVD.  No adenopathy. PULMONARY: Good air entry bilaterally.  No adventitious sounds. CARDIOVASCULAR: S1 and S2. Regular rate and rhythm.  2/6 systolic ejection murmur left sternal border ABDOMEN: Nondistended, soft, no hepatosplenomegaly.  No tenderness.  No masses. MUSCULOSKELETAL: No joint  deformity, no clubbing, no edema.  Vertebral tenderness mid thoracic to lower thoracic mostly on the right. NEUROLOGIC: No focal deficit, speech is fluent. SKIN: Intact,warm,dry. PSYCH: Mood uncomfortable, behavior normal.     Assessment & Plan:     ICD-10-CM   1. Acute right-sided thoracic back pain  M54.6 DG Thoracic Spine 2 View   Obtain T-spine films MRI if T-spine films suggest need Query compression fracture    2. Acute respiratory failure with hypoxia (HCC)  J96.01    Resolved, now on room air No further episodes of breathlessness    3. Aspiration pneumonia of right lower lobe due to gastric secretions (HCC)  J69.0    Continue antireflux measures Completed antibiotic therapy    4. Chronic GERD  K21.9    Continue PPI and antireflux measures     Orders  Placed This Encounter  Procedures   DG Thoracic Spine 2 View    Standing Status:   Future    Number of Occurrences:   1    Standing Expiration Date:   03/02/2022    Order Specific Question:   Reason for Exam (SYMPTOM  OR DIAGNOSIS REQUIRED)    Answer:   Back pain, ? compression fracture    Order Specific Question:   Preferred imaging location?    Answer:   [place] the patient continue using her nebulizers and performing pulmonary toilet.  Continue taking Protonix and antireflux measures.  We will see her in follow-up in 3 to 4 weeks time with either me or the nurse practitioner at that time.  We will make appropriate referrals to orthopedics as needed pending her imaging of the spine.  Renold Don, MD Advanced Bronchoscopy PCCM [facility]    *This note was dictated using voice recognition software/Dragon.  Despite best efforts to proofread, errors can occur which can change the meaning. Any transcriptional errors that result from this process are unintentional and may not be fully corrected at the time of dictation.

## 2021-03-03 ENCOUNTER — Telehealth: Payer: Self-pay | Admitting: Pulmonary Disease

## 2021-03-03 DIAGNOSIS — S22080A Wedge compression fracture of T11-T12 vertebra, initial encounter for closed fracture: Secondary | ICD-10-CM

## 2021-03-03 NOTE — Telephone Encounter (Signed)
Spoke to patient's daughter, Pamela(DPR). She is requesting thoracic spin x ray results. She is wanting to know if a MRI will be ordered.   Dr. Patsey Berthold, please advise. Thanks

## 2021-03-04 NOTE — Telephone Encounter (Signed)
[name], Pamela(DPR) is aware of results/recommendations and voiced her understanding.  MRI has been ordered.  Nothing further needed at this time.

## 2021-03-04 NOTE — Telephone Encounter (Signed)
She appears to have a compression fracture of T11 yes we will need an MRI and referral to IR for kyphoplasty if the MRI shows significant compression fracture.

## 2021-03-08 ENCOUNTER — Other Ambulatory Visit (INDEPENDENT_AMBULATORY_CARE_PROVIDER_SITE_OTHER): Payer: Medicare HMO

## 2021-03-08 ENCOUNTER — Other Ambulatory Visit: Payer: Self-pay

## 2021-03-08 DIAGNOSIS — I5022 Chronic systolic (congestive) heart failure: Secondary | ICD-10-CM

## 2021-03-08 LAB — BASIC METABOLIC PANEL
BUN: 14 mg/dL (ref 6–23)
CO2: 26 mEq/L (ref 19–32)
Calcium: 9.3 mg/dL (ref 8.4–10.5)
Chloride: 103 mEq/L (ref 96–112)
Creatinine, Ser: 1.06 mg/dL (ref 0.40–1.20)
GFR: 46.9 mL/min — ABNORMAL LOW (ref >60.00)
Glucose, Bld: 124 mg/dL — ABNORMAL HIGH (ref 70–99)
Potassium: 4.1 mEq/L (ref 3.5–5.1)
Sodium: 139 mEq/L (ref 135–145)

## 2021-03-09 ENCOUNTER — Telehealth: Payer: Self-pay | Admitting: Internal Medicine

## 2021-03-09 NOTE — Telephone Encounter (Signed)
Spoke with pt's daughter to let her know that the rx was sent in on 02/23/2021. Advised daughter to call the office back if she has trouble getting in filled. Daughter gave a verbal understanding.

## 2021-03-09 NOTE — Telephone Encounter (Signed)
Pt need refill on ALPRAZolam (XANAX) 0.25 MG tablet sent to [facility] on [address] in [place]

## 2021-03-10 ENCOUNTER — Emergency Department: Payer: Medicare HMO | Admitting: Certified Registered Nurse Anesthetist

## 2021-03-10 ENCOUNTER — Encounter
Admission: EM | Disposition: A | Discharge status: Home / Self Care | Payer: Self-pay | Source: Home / Self Care | Attending: Emergency Medicine

## 2021-03-10 ENCOUNTER — Emergency Department: Payer: Medicare HMO

## 2021-03-10 ENCOUNTER — Other Ambulatory Visit: Payer: Self-pay

## 2021-03-10 ENCOUNTER — Ambulatory Visit
Admission: EM | Admit: 2021-03-10 | Discharge: 2021-03-10 | Disposition: A | Discharge status: Home / Self Care | Payer: Medicare HMO | Attending: Internal Medicine | Admitting: Internal Medicine

## 2021-03-10 DIAGNOSIS — I517 Cardiomegaly: Secondary | ICD-10-CM | POA: Diagnosis not present

## 2021-03-10 DIAGNOSIS — K222 Esophageal obstruction: Secondary | ICD-10-CM | POA: Insufficient documentation

## 2021-03-10 DIAGNOSIS — R1314 Dysphagia, pharyngoesophageal phase: Secondary | ICD-10-CM | POA: Diagnosis not present

## 2021-03-10 DIAGNOSIS — K449 Diaphragmatic hernia without obstruction or gangrene: Secondary | ICD-10-CM | POA: Diagnosis not present

## 2021-03-10 DIAGNOSIS — K3189 Other diseases of stomach and duodenum: Secondary | ICD-10-CM | POA: Insufficient documentation

## 2021-03-10 DIAGNOSIS — T18128A Food in esophagus causing other injury, initial encounter: Secondary | ICD-10-CM | POA: Diagnosis not present

## 2021-03-10 DIAGNOSIS — K2970 Gastritis, unspecified, without bleeding: Secondary | ICD-10-CM | POA: Diagnosis not present

## 2021-03-10 DIAGNOSIS — E039 Hypothyroidism, unspecified: Secondary | ICD-10-CM | POA: Diagnosis not present

## 2021-03-10 DIAGNOSIS — I480 Paroxysmal atrial fibrillation: Secondary | ICD-10-CM | POA: Insufficient documentation

## 2021-03-10 DIAGNOSIS — F411 Generalized anxiety disorder: Secondary | ICD-10-CM | POA: Diagnosis not present

## 2021-03-10 DIAGNOSIS — K219 Gastro-esophageal reflux disease without esophagitis: Secondary | ICD-10-CM | POA: Diagnosis not present

## 2021-03-10 DIAGNOSIS — E861 Hypovolemia: Secondary | ICD-10-CM | POA: Diagnosis not present

## 2021-03-10 DIAGNOSIS — R531 Weakness: Secondary | ICD-10-CM | POA: Diagnosis not present

## 2021-03-10 DIAGNOSIS — Z86711 Personal history of pulmonary embolism: Secondary | ICD-10-CM | POA: Insufficient documentation

## 2021-03-10 DIAGNOSIS — I052 Rheumatic mitral stenosis with insufficiency: Secondary | ICD-10-CM | POA: Diagnosis not present

## 2021-03-10 DIAGNOSIS — E785 Hyperlipidemia, unspecified: Secondary | ICD-10-CM | POA: Diagnosis not present

## 2021-03-10 DIAGNOSIS — I9589 Other hypotension: Secondary | ICD-10-CM | POA: Diagnosis not present

## 2021-03-10 DIAGNOSIS — T18108A Unspecified foreign body in esophagus causing other injury, initial encounter: Secondary | ICD-10-CM

## 2021-03-10 DIAGNOSIS — Z8711 Personal history of peptic ulcer disease: Secondary | ICD-10-CM | POA: Diagnosis not present

## 2021-03-10 DIAGNOSIS — Z8673 Personal history of transient ischemic attack (TIA), and cerebral infarction without residual deficits: Secondary | ICD-10-CM | POA: Diagnosis not present

## 2021-03-10 DIAGNOSIS — Z7901 Long term (current) use of anticoagulants: Secondary | ICD-10-CM | POA: Diagnosis not present

## 2021-03-10 DIAGNOSIS — I130 Hypertensive heart and chronic kidney disease with heart failure and stage 1 through stage 4 chronic kidney disease, or unspecified chronic kidney disease: Secondary | ICD-10-CM | POA: Insufficient documentation

## 2021-03-10 DIAGNOSIS — J986 Disorders of diaphragm: Secondary | ICD-10-CM | POA: Diagnosis not present

## 2021-03-10 DIAGNOSIS — E46 Unspecified protein-calorie malnutrition: Secondary | ICD-10-CM | POA: Diagnosis not present

## 2021-03-10 DIAGNOSIS — N1831 Chronic kidney disease, stage 3a: Secondary | ICD-10-CM | POA: Diagnosis not present

## 2021-03-10 DIAGNOSIS — I5023 Acute on chronic systolic (congestive) heart failure: Secondary | ICD-10-CM | POA: Diagnosis not present

## 2021-03-10 DIAGNOSIS — X58XXXA Exposure to other specified factors, initial encounter: Secondary | ICD-10-CM | POA: Diagnosis not present

## 2021-03-10 DIAGNOSIS — T18128D Food in esophagus causing other injury, subsequent encounter: Secondary | ICD-10-CM | POA: Diagnosis not present

## 2021-03-10 DIAGNOSIS — R1319 Other dysphagia: Secondary | ICD-10-CM | POA: Diagnosis not present

## 2021-03-10 HISTORY — PX: ESOPHAGOGASTRODUODENOSCOPY: SHX5428

## 2021-03-10 LAB — BASIC METABOLIC PANEL
Anion gap: 10 (ref 5–15)
BUN: 16 mg/dL (ref 8–23)
CO2: 24 mmol/L (ref 22–32)
Calcium: 9.4 mg/dL (ref 8.9–10.3)
Chloride: 106 mmol/L (ref 98–111)
Creatinine, Ser: 1.06 mg/dL — ABNORMAL HIGH (ref 0.44–1.00)
GFR, Estimated: 51 mL/min — ABNORMAL LOW (ref >60)
Glucose, Bld: 106 mg/dL — ABNORMAL HIGH (ref 70–99)
Potassium: 4.6 mmol/L (ref 3.5–5.1)
Sodium: 140 mmol/L (ref 135–145)

## 2021-03-10 LAB — CBC
HCT: 37 % (ref 36.0–46.0)
Hemoglobin: 11.6 g/dL — ABNORMAL LOW (ref 12.0–15.0)
MCH: 27.5 pg (ref 26.0–34.0)
MCHC: 31.4 g/dL (ref 30.0–36.0)
MCV: 87.7 fL (ref 80.0–100.0)
Platelets: 316 10*3/uL (ref 150–400)
RBC: 4.22 MIL/uL (ref 3.87–5.11)
RDW: 22.3 % — ABNORMAL HIGH (ref 11.5–15.5)
WBC: 10.8 10*3/uL — ABNORMAL HIGH (ref 4.0–10.5)
nRBC: 0 % (ref 0.0–0.2)

## 2021-03-10 LAB — KOH PREP: Special Requests: NORMAL

## 2021-03-10 SURGERY — EGD (ESOPHAGOGASTRODUODENOSCOPY)
Anesthesia: General

## 2021-03-10 MED ORDER — FENTANYL CITRATE (PF) 100 MCG/2ML IJ SOLN
INTRAMUSCULAR | Status: DC | PRN
  Administered 2021-03-10: 50 ug via INTRAVENOUS

## 2021-03-10 MED ORDER — GLUCAGON HCL RDNA (DIAGNOSTIC) 1 MG IJ SOLR
1.0000 mg | Freq: Once | INTRAMUSCULAR | Status: AC
  Administered 2021-03-10: 11:00:00 1 mg via INTRAVENOUS
  Filled 2021-03-10: qty 1

## 2021-03-10 MED ORDER — SODIUM CHLORIDE 0.9 % IV SOLN: INTRAVENOUS | Status: DC

## 2021-03-10 MED ORDER — LIDOCAINE HCL (CARDIAC) PF 100 MG/5ML IV SOSY
PREFILLED_SYRINGE | INTRAVENOUS | Status: DC | PRN
  Administered 2021-03-10: 50 mg via INTRAVENOUS

## 2021-03-10 MED ORDER — FENTANYL CITRATE (PF) 100 MCG/2ML IJ SOLN
INTRAMUSCULAR | Status: AC
  Filled 2021-03-10: qty 2

## 2021-03-10 MED ORDER — PHENYLEPHRINE 40 MCG/ML (10ML) SYRINGE FOR IV PUSH (FOR BLOOD PRESSURE SUPPORT)
PREFILLED_SYRINGE | INTRAVENOUS | Status: DC | PRN
  Administered 2021-03-10: 80 ug via INTRAVENOUS

## 2021-03-10 MED ORDER — PROPOFOL 10 MG/ML IV BOLUS
INTRAVENOUS | Status: DC | PRN
  Administered 2021-03-10: 60 mg via INTRAVENOUS
  Administered 2021-03-10: 50 mg via INTRAVENOUS

## 2021-03-10 MED ORDER — SUCCINYLCHOLINE CHLORIDE 200 MG/10ML IV SOSY
PREFILLED_SYRINGE | INTRAVENOUS | Status: DC | PRN
  Administered 2021-03-10: 60 mg via INTRAVENOUS

## 2021-03-10 NOTE — Transfer of Care (Signed)
Immediate Anesthesia Transfer of Care Note  Patient: Frances Kent  Procedure(s) Performed: ESOPHAGOGASTRODUODENOSCOPY (EGD)  Patient Location: Endoscopy Unit  Anesthesia Type:General  Level of Consciousness: drowsy  Airway & Oxygen Therapy: Patient Spontanous Breathing and Patient connected to face mask oxygen  Post-op Assessment: Report given to RN and Post -op Vital signs reviewed and stable  Post vital signs: Reviewed and stable  Last Vitals:  Vitals Value Taken Time  BP 125/51   Temp    Pulse 60 03/10/21 1421  Resp 11 03/10/21 1421  SpO2 100 % 03/10/21 1421  Vitals shown include unvalidated device data.  Last Pain:  Vitals:   03/10/21 1316  TempSrc: Temporal  PainSc: 10-Worst pain ever         Complications: No notable events documented.

## 2021-03-10 NOTE — ED Triage Notes (Signed)
Pt sent from PCP with c/o a piece of chicken stuck in her throat since yesterday, pt is able to tolerate liquids but has not tried to eat anything since

## 2021-03-10 NOTE — Consult Note (Signed)
GI Inpatient Consult Note  Reason for Consult: Esophageal dysphagia   Attending Requesting Consult: Dr. Lavonia Drafts, MD  History of Present Illness: Frances Kent is a 88 y.o. female seen for evaluation of esophageal dysphagia at the request of ED physician - Dr. Lavonia Drafts. Pt has a PMH of paroxysmal atrial fibrillation on chronic anticoagulation with Xarelto, HTN, mitral stenosis, heart failure with reduced ejection fraction, HLD, chronic fatigue, Hx of CVA, hypothyroidism, Hx of PE, GAD, and recent hospital discharge for CAP/aspiration pneumonia. She presented to my clinic as outpatient this morning for chief complaint of esophageal dysphagia concerning for food impaction. I personally transported patient to ED check-in and gave patient report to triage nurse. Patient reports that she was eating a chicken tender cooked at home on a portable [employer] around 2 PM yesterday afternoon. Towards the end of her meal she reports she felt like it got stuck in her esophagus at level of her upper sternum just under the suprasternal notch. She tried to drink water to get it to go down, but this didn't help. She has not been able to tolerate oral intake since this time. She was able to get a bite of banana and small sip of orange juice to go down. This morning she felt like she was able to make it go down to level of her lower sternum at xiphoid process. She is experiencing some pressure-type discomfort in her lower sternum and tightness. She is able to tolerate her own secretions without difficulty. She denies any fevers, chills, or nausea.She has not taken her oral medications over the past two days. Her last dose of Xarelto was Sunday evening. She reports symptoms are very similar to her presentation October 2021 which required EGD for removal of food bolus. Of note, she has admitted x 2 at [facility] last month and this month for concerns of CAP and aspiration pneumonia. She has been losing  weight and this concerns her. She doesn't feel like she has any of her strength. Glucagon has not been able to provide any improvement.    Summary of GI Evaluation: - CSY: 11/03/2011 - sigmoid diverticulosis, three subcentimeter polyps with path showing tubular adenomas x2 and hyperplastic polyp - EGD: 11/27/2019 - food in the lower third of the esophagus gently pushed into the stomach, mild Schatzki ring, erosive gastritis with features of healing mucosal injury  - EGD: 12/24/2019 - normal esophagus with proximal and distal esophageal biopsies negative for EoE but positive for reflux esophagitis, single gastric benign polyp, erythematous mucosa in antrum, normal examined duodenum  - BaS: 06/08/20 - no evidence of esophageal stricture, small transient hiatal hernia, normal esophageal motility. No evidence of reflux.    Past Medical History:  Past Medical History:  Diagnosis Date   Atrial fibrillation (Frances Kent)    Benign breast cyst in female, left 10/07/2016   Colon adenomas    GERD (gastroesophageal reflux disease)    Hypertension    Hypothyroidism    Macular degeneration    Mitral regurgitation    Pulmonary embolism (Frances Kent) 02/2016   SVT (supraventricular tachycardia) (Frances Kent)     Problem List: Patient Active Problem List   Diagnosis Date Noted   Acute right-sided back pain 02/23/2021   CAP (community acquired pneumonia) 02/15/2021   HCAP (healthcare-associated pneumonia) 02/02/2021   Severe sepsis ([place]) 02/02/2021   Depression with anxiety 02/02/2021   Iron deficiency anemia 01/25/2021   Symptomatic anemia 01/13/2021   History of embolic stroke 32/95/1884  Inflamed external hemorrhoid 12/08/2020   Insomnia due to anxiety and fear 12/08/2020   Long term (current) use of anticoagulants    Chronic HFrEF (heart failure with reduced ejection fraction) (HCC)    Left basal ganglia embolic stroke (Frances Kent) 28/78/6767   Malnutrition of moderate degree (Frances Kent) 11/03/2020   Prolonged QT interval     Acute on chronic systolic (congestive) heart failure (HCC)    Persistent atrial fibrillation ([place]) 10/30/2020   Antibiotic-associated diarrhea 10/15/2020   ILD (interstitial lung disease) ([place]) 09/22/2020   Atrial flutter, paroxysmal ([place]) 09/10/2020   Tachyarrhythmia 09/08/2020   Sick sinus syndrome (Frances Kent) 09/08/2020   Elevated serum free T4 level 09/08/2020   Acute respiratory failure with hypoxia (Frances Kent) 08/28/2020   Purpura senilis (Frances Kent) 08/28/2020   Hiatal hernia 08/09/2020   Atrial fibrillation with rapid ventricular response (Frances Kent) 08/06/2020   Thrombophilia (Frances Kent) 08/06/2020   Elevated troponin I level 05/21/2020   CVA (cerebral vascular accident) (Frances Kent) 03/24/2020   History of COVID-19 03/03/2020   Bilateral carotid artery stenosis 12/23/2019   Schatzki's ring of distal esophagus 12/11/2019   Erosive gastritis 11/30/2019   Dysphagia 11/27/2019   Painful swallowing 11/27/2019   Gastroesophageal reflux disease 11/27/2019   Stage 3a chronic kidney disease (Frances Kent) 04/11/2019   Coagulopathy (Frances Kent) 09/12/2018   Essential hypertension 20/94/7096   Chronic systolic CHF (congestive heart failure) (Frances Kent) 08/15/2018   Hypothyroidism due to acquired atrophy of thyroid 05/08/2018   Unintentional weight loss 05/08/2018   Myalgia due to statin 05/08/2018   Abdominal aortic atherosclerosis ([place]) 05/07/2018   Bradycardia 04/28/2018   Pleural effusion on left 02/27/2018   Moderate mitral stenosis 01/10/2018   Lumbar radiculitis 01/07/2018   B12 deficiency 07/26/2017   Atrial fibrillation status post cardioversion (Frances Kent) 04/06/2016   Hospital discharge follow-up 03/10/2016   Vitamin D deficiency 04/08/2015   Cervical spine degeneration 12/13/2014   History of pulmonary embolism 12/02/2014   Insomnia 10/04/2014   Moderate tricuspid insufficiency 09/17/2014   Generalized anxiety disorder 03/28/2013   Mitral valve prolapse 03/28/2013   Routine adult health maintenance 03/23/2012   Fatigue  03/23/2012   Cough 04/17/2011   Screening for breast cancer 11/29/2010   Macular degeneration, left eye 11/29/2010   Screening for colon cancer 11/29/2010   Hyperlipidemia LDL goal <70 11/29/2010   GERD (gastroesophageal reflux disease)     Past Surgical History: Past Surgical History:  Procedure Laterality Date   APPENDECTOMY  1960   BREAST CYST ASPIRATION Left 2017   CARDIOVERSION N/A 04/05/2016   Procedure: Cardioversion;  Surgeon: [name], MD;  Location: [facility];  Service: Cardiovascular;  Laterality: N/A;   CHOLECYSTECTOMY  1985   ESOPHAGOGASTRODUODENOSCOPY (EGD) WITH PROPOFOL N/A 11/27/2019   Procedure: ESOPHAGOGASTRODUODENOSCOPY (EGD) WITH PROPOFOL;  Surgeon: Lesly Rubenstein, MD;  Location: [facility];  Service: Endoscopy;  Laterality: N/A;   ESOPHAGOGASTRODUODENOSCOPY (EGD) WITH PROPOFOL N/A 12/24/2019   Procedure: ESOPHAGOGASTRODUODENOSCOPY (EGD) WITH PROPOFOL;  Surgeon: Lesly Rubenstein, MD;  Location: [facility];  Service: Endoscopy;  Laterality: N/A;   IR CT HEAD LTD  11/01/2020   IR PERCUTANEOUS ART THROMBECTOMY/INFUSION INTRACRANIAL INC DIAG ANGIO  11/01/2020   IR US GUIDE VASC ACCESS RIGHT  11/02/2020   OVARIAN CYST REMOVAL     RADIOLOGY WITH ANESTHESIA N/A 11/01/2020   Procedure: RADIOLOGY WITH ANESTHESIA;  Surgeon: Luanne Bras, MD;  Location: [place];  Service: Radiology;  Laterality: N/A;   TEE WITHOUT CARDIOVERSION N/A 02/01/2018   Procedure: TRANSESOPHAGEAL ECHOCARDIOGRAM (TEE);  Surgeon: [name], MD;  Location: [facility] OR[facility];  Service: Cardiovascular;  Laterality: N/A;    Allergies: Allergies  Allergen Reactions   Statins     Severe myalgias    Home Medications: (Not in a hospital admission)  Home medication reconciliation was completed with the patient.   Scheduled Inpatient Medications:    Continuous Inpatient Infusions:    PRN Inpatient Medications:    Family History: family history includes Breast cancer (age of  onset: 19) in her mother; Cancer (age of onset: 43) in her son; Cancer (age of onset: 92) in her mother; Heart disease in her son; Heart disease (age of onset: 34) in her maternal grandmother; Stroke in her sister.  The patient's family history is negative for inflammatory bowel disorders, GI malignancy, or solid organ transplantation.  Social History:   reports that she has never smoked. She has never used smokeless tobacco. She reports that she does not drink alcohol and does not use drugs. The patient denies ETOH, tobacco, or drug use.   Review of Systems: Constitutional: Weight is stable.  Eyes: No changes in vision. ENT: No oral lesions, sore throat.  GI: see HPI.  Heme/Lymph: No easy bruising.  CV: No chest pain.  GU: No hematuria.  Integumentary: No rashes.  Neuro: No headaches.  Psych: No depression/anxiety.  Endocrine: No heat/cold intolerance.  Allergic/Immunologic: No urticaria.  Resp: No cough, SOB.  Musculoskeletal: No joint swelling.    Physical Examination: BP (!) 128/57 (BP Location: Left Arm)    Pulse 81    Temp 98.9 F (37.2 C) (Oral)    Resp 16    SpO2 96%  Non-toxic appearing elderly female laying in ED stretcher. No accessory muscle use.  General: NAD, alert and oriented x 4 HEENT: PEERLA, EOMI, anciteric  Neck: supple, no JVD or thyromegaly. No lymphadenopathy.  Respiratory: CTA bilaterally, no wheezes, crackles, or other adventitious sounds Cardiac: RRR, no murmur, rub, or gallop  GI: soft, normal bowel sounds, no TTP, no HSM, no rebound or guarding MSK: no edema, well perfused with 2+ pulses, Skin: Skin color, texture, turgor normal, no rashes or lesions Lymph: no LAD Neuro: Grossly intact   Data: Lab Results  Component Value Date   WBC 10.8 (H) 03/10/2021   HGB 11.6 (L) 03/10/2021   HCT 37.0 03/10/2021   MCV 87.7 03/10/2021   PLT 316 03/10/2021   Recent Labs  Lab 03/10/21 1056  HGB 11.6*   Lab Results  Component Value Date   NA 140  03/10/2021   K 4.6 03/10/2021   CL 106 03/10/2021   CO2 24 03/10/2021   BUN 16 03/10/2021   CREATININE 1.06 (H) 03/10/2021   GLU 90 03/22/2014   Lab Results  Component Value Date   ALT 25 02/14/2021   AST 27 02/14/2021   ALKPHOS 100 02/14/2021   BILITOT 0.6 02/14/2021   No results for input(s): APTT, INR, PTT in the last 168 hours. Assessment/Plan:  88 y/o Caucasian female with a PMH of paroxysmal atrial fibrillation on chronic anticoagulation with Xarelto, HTN, mitral stenosis, heart failure with reduced ejection fraction, HLD, chronic fatigue, Hx of CVA, hypothyroidism, Hx of PE, and GAD presents to the [place] clinic for follow-up  1. Esophageal dysphagia - concerning for esophageal food obstruction 2/2 food impaction  2. Hx of food impaction requiring EGD 11/2019  3. Chronic anticoagulation - last dose Xarelto Sunday evening  4. Hx of CAP/aspiration pneumonia - recent hospital admission x 2, discharged 1/7  5. Weakness - 2/2 physical deconditioning,  multiple hospital admissions  Recommendations:  - Clinical presentation highly c/w esophageal food obstruction 2/2 food impaction  - Vital signs stable. Labs show no significant electrolyte derangement, leukocytosis, or anemia. No accessory muscle use and no difficulty tolerating secretions.  - Chest x-ray to rule out cardiopulmonary disease - Advise EGD this afternoon with Dr. Alice Reichert for food bolus removal. Discussed procedure details and indications with patient and family in room today. She consents to proceed.  - Strict NPO - Plan for EGD this afternoon with Dr. Alice Reichert - See procedure notes for findings and further recommendations  I reviewed the risks (including bleeding, perforation, infection, anesthesia complications, cardiac/respiratory complications), benefits and alternatives of EGD. Patient consents to proceed.    Thank you for the consult. Please call with questions or concerns.  Reeves Forth [facility] [phone] (Cell)

## 2021-03-10 NOTE — ED Notes (Signed)
Pt had no change following glucagon administration.

## 2021-03-10 NOTE — Anesthesia Preprocedure Evaluation (Signed)
Anesthesia Evaluation  Patient identified by MRN, date of birth, ID band Patient awake    Reviewed: Allergy & Precautions, H&P , NPO status , Patient's Chart, lab work & pertinent test results  History of Anesthesia Complications Negative for: history of anesthetic complications  Airway Mallampati: III  TM Distance: <3 FB Neck ROM: limited    Dental  (+) Chipped, Poor Dentition, Missing, Partial Lower, Upper Dentures, Dental Advidsory Given   Pulmonary neg pulmonary ROS, neg shortness of breath, neg COPD,    Pulmonary exam normal        Cardiovascular Exercise Tolerance: Good hypertension, +CHF  (-) Past MI and (-) Cardiac Stents + dysrhythmias Atrial Fibrillation (-) Valvular Problems/Murmurs Rhythm:irregular Rate:Tachycardia     Neuro/Psych neg Seizures PSYCHIATRIC DISORDERS Anxiety Depression  Neuromuscular disease CVA, No Residual Symptoms negative psych ROS   GI/Hepatic Neg liver ROS, hiatal hernia, PUD, GERD  Controlled and Medicated,  Endo/Other  neg diabetesHypothyroidism   Renal/GU Renal disease     Musculoskeletal  (+) Arthritis ,   Abdominal   Peds  Hematology negative hematology ROS (+)   Anesthesia Other Findings Past Medical History: No date: Atrial fibrillation (HCC) No date: GERD (gastroesophageal reflux disease) No date: Mitral regurgitation 02/2016: Pulmonary embolism ([place])  Past Surgical History: 1960: APPENDECTOMY 2017: BREAST CYST ASPIRATION; Left 04/05/2016: CARDIOVERSION; N/A     Comment:  Procedure: Cardioversion;  Surgeon: [name],               MD;  Location: [facility] OR[facility];  Service: Cardiovascular;                Laterality: N/A; 1985: CHOLECYSTECTOMY No date: OVARIAN CYST REMOVAL 02/01/2018: TEE WITHOUT CARDIOVERSION; N/A     Comment:  Procedure: TRANSESOPHAGEAL ECHOCARDIOGRAM (TEE);                Surgeon: [name], MD;  Location: [facility];                Service:  Cardiovascular;  Laterality: N/A;  BMI    Body Mass Index: 18.60 kg/m      Reproductive/Obstetrics negative OB ROS                             Anesthesia Physical  Anesthesia Plan  ASA: 4 and emergent  Anesthesia Plan: General ETT   Post-op Pain Management:    Induction: Intravenous, Rapid sequence and Cricoid pressure planned  PONV Risk Score and Plan: Ondansetron, Dexamethasone, Midazolam and Treatment may vary due to age or medical condition  Airway Management Planned: Oral ETT  Additional Equipment:   Intra-op Plan:   Post-operative Plan: Extubation in OR  Informed Consent: I have reviewed the patients History and Physical, chart, labs and discussed the procedure including the risks, benefits and alternatives for the proposed anesthesia with the patient or authorized representative who has indicated his/her understanding and acceptance.     Dental Advisory Given  Plan Discussed with: Anesthesiologist, CRNA and Surgeon  Anesthesia Plan Comments: (Patient consented for risks of anesthesia including but not limited to:  - adverse reactions to medications - damage to eyes, teeth, lips or other oral mucosa - nerve damage due to positioning  - sore throat or hoarseness - Damage to heart, brain, nerves, lungs, other parts of body or loss of life  Patient voiced understanding.)        Anesthesia Quick Evaluation

## 2021-03-10 NOTE — ED Provider Notes (Signed)
[facility] Provider Note    Event Date/Time   First MD Initiated Contact with Patient 03/10/21 1047     (approximate)   History   Foreign Body   HPI  Frances Kent is a 88 y.o. female who presents with complaints of a piece of chicken stuck in her throat.  Patient reports she has had esophageal impactions in the past, has also reportedly had an esophageal dilatation.  A chicken yesterday, has not been tolerating liquids.  No difficulty breathing.     Physical Exam   Triage Vital Signs: ED Triage Vitals  Enc Vitals Group     BP 03/10/21 1044 (!) 128/57     Pulse Rate 03/10/21 1044 81     Resp 03/10/21 1044 16     Temp 03/10/21 1044 98.9 F (37.2 C)     Temp Source 03/10/21 1044 Oral     SpO2 03/10/21 1044 96 %     Weight --      Height --      Head Circumference --      Peak Flow --      Pain Score 03/10/21 1316 10     Pain Loc --      Pain Edu? --      Excl. in [place]? --     Most recent vital signs: Vitals:   03/10/21 1430 03/10/21 1445  BP: (!) 128/55 136/64  Pulse: (!) 59 79  Resp: (!) 23 (!) 27  Temp:    SpO2: 100% 94%     General: Awake, no distress.  CV:  Good peripheral perfusion.  Resp:  Normal effort.  Abd:  No distention.  Other:     ED Results / Procedures / Treatments   Labs (all labs ordered are listed, but only abnormal results are displayed) Labs Reviewed  CBC - Abnormal; Notable for the following components:      Result Value   WBC 10.8 (*)    Hemoglobin 11.6 (*)    RDW 22.3 (*)    All other components within normal limits  BASIC METABOLIC PANEL - Abnormal; Notable for the following components:   Glucose, Bld 106 (*)    Creatinine, Ser 1.06 (*)    GFR, Estimated 51 (*)    All other components within normal limits  KOH PREP     EKG     RADIOLOGY Chest x-ray reviewed by me, no acute abnormality    PROCEDURES:  Critical Care performed:   Procedures   MEDICATIONS ORDERED IN  ED: Medications  0.9 %  sodium chloride infusion ( Intravenous Anesthesia Volume Adjustment 03/10/21 1416)  glucagon (human recombinant) (GLUCAGEN) injection 1 mg (1 mg Intravenous Given 03/10/21 1121)     IMPRESSION / MDM / ASSESSMENT AND PLAN / ED COURSE  I reviewed the triage vital signs and the nursing notes.  Patient with esophageal impaction as described above, no success with IV glucagon   Lab work obtained, mild nonspecific elevation of white blood cell count, BMP is reassuring  Glucose is unremarkable  Chest x-ray requested by GI without acute abnormality, possible resolving pneumonia  Consulted Dr. Alice Reichert of GI, he will take the patient to endoscopy         FINAL CLINICAL IMPRESSION(S) / ED DIAGNOSES   Final diagnoses:  Impacted foreign body in esophagus, initial encounter     Rx / DC Orders   ED Discharge Orders     None  Note:  This document was prepared using Dragon voice recognition software and may include unintentional dictation errors.   Lavonia Drafts, MD 03/10/21 [phone]

## 2021-03-10 NOTE — Op Note (Signed)
[facility] Gastroenterology Patient Name: Frances Kent Procedure Date: 03/10/2021 1:44 PM MRN: [id number] Account #: [id number] Date of Birth: [birth year] Admit Type: Emergency Department Age: 88 Room: [facility] ENDO ROOM 2 Gender: Female Note Status: Finalized Instrument Name: Altamese Cabal Endoscope [id number] Procedure:             Upper GI endoscopy Indications:           Dysphagia, Foreign body in the esophagus Providers:             Benay Pike. Alice Reichert MD, MD Referring MD:          Deborra Medina, MD (Referring MD) Medicines:             Propofol per Anesthesia Complications:         No immediate complications. Procedure:             Pre-Anesthesia Assessment:                        - The risks and benefits of the procedure and the                         sedation options and risks were discussed with the                         patient. All questions were answered and informed                         consent was obtained.                        - Patient identification and proposed procedure were                         verified prior to the procedure by the nurse. The                         procedure was verified in the procedure room.                        - ASA Grade Assessment: III - A patient with severe                         systemic disease.                        - After reviewing the risks and benefits, the patient                         was deemed in satisfactory condition to undergo the                         procedure.                        After obtaining informed consent, the endoscope was                         passed under direct vision. Throughout the procedure,  the patient's blood pressure, pulse, and oxygen                         saturations were monitored continuously. The Endoscope                         was introduced through the mouth, and advanced to the                         third part of duodenum. The upper GI  endoscopy was                         somewhat difficult due to presence of food. Successful                         completion of the procedure was aided by performing                         the maneuvers documented (below) in this report. The                         patient tolerated the procedure well. Findings:      Food was found in the lower third of the esophagus. Removal of food was       accomplished. The food bolus was gently pushed into the stomach with the       tip of the endoscope. This was aided by irrigating water to rinse the       food into the stomach. Estimated blood loss: none.      Patchy, white plaques were found in the upper third of the esophagus.       Cells for cytology were obtained by brushing.      One benign-appearing, intrinsic mild stenosis was found in the distal       esophagus. This stenosis measured 1.3 cm (inner diameter) x less than       one cm (in length). The stenosis was traversed. The lesion was not       amenable to dilation, and this was not attempted.      A 2 cm hiatal hernia was present.      Striped moderately erythematous mucosa without bleeding was found in the       gastric antrum.      The examined duodenum was normal.      The exam was otherwise without abnormality. Impression:            - Food in the lower third of the esophagus. Removal                         was successful.                        - Esophageal plaques were found, suspicious for                         candidiasis. Cells for cytology obtained.                        - Benign-appearing esophageal stenosis. Lesion not  amenable to dilation, and not attempted.                        - 2 cm hiatal hernia.                        - Erythematous mucosa in the antrum.                        - Normal examined duodenum.                        - The examination was otherwise normal. Recommendation:        - Patient has a contact number available for                          emergencies. The signs and symptoms of potential                         delayed complications were discussed with the patient.                         Return to normal activities tomorrow. Written                         discharge instructions were provided to the patient.                        - Mechanical soft diet.                        - Return to GI office PRN.                        - Await pathology results.                        - The findings and recommendations were discussed with                         the patient. Procedure Code(s):     --- Professional ---                        [phone], Esophagogastroduodenoscopy, flexible,                         transoral; with removal of foreign body(s) Diagnosis Code(s):     --- Professional ---                        T18.108A, Unspecified foreign body in esophagus                         causing other injury, initial encounter                        R13.10, Dysphagia, unspecified                        K31.89, Other diseases of stomach and duodenum  K44.9, Diaphragmatic hernia without obstruction or                         gangrene                        K22.2, Esophageal obstruction                        K22.9, Disease of esophagus, unspecified                        T18.128A, Food in esophagus causing other injury,                         initial encounter CPT copyright 2019 American Medical Association. All rights reserved. The codes documented in this report are preliminary and upon coder review may  be revised to meet current compliance requirements. Efrain Sella MD, MD 03/10/2021 2:18:28 PM This report has been signed electronically. Number of Addenda: 0 Note Initiated On: 03/10/2021 1:44 PM Estimated Blood Loss:  Estimated blood loss: none.      [facility]

## 2021-03-10 NOTE — OR Nursing (Signed)
Esophageal brushing sent to lab for KOH, candida

## 2021-03-10 NOTE — Anesthesia Procedure Notes (Signed)
Procedure Name: Intubation Date/Time: 03/10/2021 2:00 PM Performed by: Lily Peer, Daneshia Tavano, CRNA Pre-anesthesia Checklist: Patient identified, Emergency Drugs available, Suction available and Patient being monitored Patient Re-evaluated:Patient Re-evaluated prior to induction Oxygen Delivery Method: Circle system utilized Preoxygenation: Pre-oxygenation with 100% oxygen Induction Type: IV induction, Rapid sequence and Cricoid Pressure applied Laryngoscope Size: McGraph and 3 Grade View: Grade I Tube type: Oral Number of attempts: 1 Airway Equipment and Method: Stylet Placement Confirmation: ETT inserted through vocal cords under direct vision, positive ETCO2 and breath sounds checked- equal and bilateral Secured at: 21 cm Tube secured with: Tape Dental Injury: Teeth and Oropharynx as per pre-operative assessment

## 2021-03-11 ENCOUNTER — Encounter: Payer: Self-pay | Admitting: Internal Medicine

## 2021-03-11 NOTE — Anesthesia Postprocedure Evaluation (Signed)
Anesthesia Post Note  Patient: KEIYANA STEHR  Procedure(s) Performed: ESOPHAGOGASTRODUODENOSCOPY (EGD)  Patient location during evaluation: Endoscopy Anesthesia Type: General Level of consciousness: awake and alert Pain management: pain level controlled Vital Signs Assessment: post-procedure vital signs reviewed and stable Respiratory status: spontaneous breathing, nonlabored ventilation, respiratory function stable and patient connected to nasal cannula oxygen Cardiovascular status: blood pressure returned to baseline and stable Postop Assessment: no apparent nausea or vomiting Anesthetic complications: no   No notable events documented.   Last Vitals:  Vitals:   03/10/21 1430 03/10/21 1445  BP: (!) 128/55 136/64  Pulse: (!) 59 79  Resp: (!) 23 (!) 27  Temp:    SpO2: 100% 94%    Last Pain:  Vitals:   03/10/21 1421  TempSrc:   PainSc: 0-No pain                 Martha Clan

## 2021-03-15 ENCOUNTER — Other Ambulatory Visit: Payer: Self-pay

## 2021-03-15 ENCOUNTER — Ambulatory Visit
Admission: RE | Admit: 2021-03-15 | Discharge: 2021-03-15 | Disposition: A | Discharge status: Home / Self Care | Payer: Medicare HMO | Source: Ambulatory Visit | Attending: Pulmonary Disease | Admitting: Pulmonary Disease

## 2021-03-15 DIAGNOSIS — M546 Pain in thoracic spine: Secondary | ICD-10-CM | POA: Diagnosis not present

## 2021-03-15 DIAGNOSIS — S22080A Wedge compression fracture of T11-T12 vertebra, initial encounter for closed fracture: Secondary | ICD-10-CM | POA: Diagnosis not present

## 2021-03-18 ENCOUNTER — Telehealth: Payer: Self-pay

## 2021-03-18 DIAGNOSIS — S22080A Wedge compression fracture of T11-T12 vertebra, initial encounter for closed fracture: Secondary | ICD-10-CM

## 2021-03-18 NOTE — Telephone Encounter (Signed)
Order placed to orthopedic. Nothing further needed.

## 2021-03-23 ENCOUNTER — Inpatient Hospital Stay: Payer: Medicare HMO | Admitting: Pulmonary Disease

## 2021-03-29 ENCOUNTER — Telehealth: Payer: Self-pay | Admitting: Pulmonary Disease

## 2021-03-29 DIAGNOSIS — S22080A Wedge compression fracture of T11-T12 vertebra, initial encounter for closed fracture: Secondary | ICD-10-CM

## 2021-03-29 NOTE — Telephone Encounter (Signed)
Lm for patient.  

## 2021-03-29 NOTE — Telephone Encounter (Signed)
[facility] no longer does kyphoplasty.  Dr. Patsey Berthold, please advise if okay to send to IR or [place] [facility]?

## 2021-03-29 NOTE — Telephone Encounter (Signed)
Yes please refer to IR.

## 2021-03-30 ENCOUNTER — Other Ambulatory Visit: Payer: Self-pay | Admitting: Internal Medicine

## 2021-03-30 NOTE — Addendum Note (Signed)
Addended by: Claudette Head A on: 03/30/2021 04:34 PM   Modules accepted: Orders

## 2021-03-30 NOTE — Telephone Encounter (Signed)
Referral placed to IR. [name], Pamela(DPR) is aware and voiced her understanding.  Nothing further needed.

## 2021-04-01 ENCOUNTER — Other Ambulatory Visit: Payer: Self-pay | Admitting: Pulmonary Disease

## 2021-04-01 DIAGNOSIS — S22080A Wedge compression fracture of T11-T12 vertebra, initial encounter for closed fracture: Secondary | ICD-10-CM

## 2021-04-02 DIAGNOSIS — H353212 Exudative age-related macular degeneration, right eye, with inactive choroidal neovascularization: Secondary | ICD-10-CM | POA: Diagnosis not present

## 2021-04-05 DIAGNOSIS — I10 Essential (primary) hypertension: Secondary | ICD-10-CM | POA: Diagnosis not present

## 2021-04-05 DIAGNOSIS — I4892 Unspecified atrial flutter: Secondary | ICD-10-CM | POA: Diagnosis not present

## 2021-04-06 ENCOUNTER — Encounter: Payer: Self-pay | Admitting: Pulmonary Disease

## 2021-04-06 ENCOUNTER — Telehealth: Payer: Self-pay

## 2021-04-06 ENCOUNTER — Other Ambulatory Visit: Payer: Self-pay

## 2021-04-06 ENCOUNTER — Ambulatory Visit: Payer: Medicare HMO | Admitting: Pulmonary Disease

## 2021-04-06 VITALS — BP 110/80 | HR 50 | Temp 97.1°F | Ht 63.0 in | Wt 105.0 lb

## 2021-04-06 DIAGNOSIS — R0600 Dyspnea, unspecified: Secondary | ICD-10-CM

## 2021-04-06 DIAGNOSIS — T17900S Unspecified foreign body in respiratory tract, part unspecified causing asphyxiation, sequela: Secondary | ICD-10-CM

## 2021-04-06 DIAGNOSIS — I5022 Chronic systolic (congestive) heart failure: Secondary | ICD-10-CM

## 2021-04-06 DIAGNOSIS — K219 Gastro-esophageal reflux disease without esophagitis: Secondary | ICD-10-CM

## 2021-04-06 NOTE — Progress Notes (Incomplete)
Subjective:    Patient ID: Frances Kent, female    DOB: May 09, 1930, 88 y.o.   MRN: [id number] Patient Care Team: [name], MD as PCP - General (Internal Medicine) [name], MD (Internal Medicine) Earlie Server, MD as Consulting Physician (Hematology and Oncology)  HPI    Review of Systems A 10 point review of systems was performed and it is as noted above otherwise negative.  Patient Active Problem List   Diagnosis Date Noted   Acute right-sided back pain 02/23/2021   CAP (community acquired pneumonia) 02/15/2021   HCAP (healthcare-associated pneumonia) 02/02/2021   Severe sepsis ([place]) 02/02/2021   Depression with anxiety 02/02/2021   Iron deficiency anemia 01/25/2021   Symptomatic anemia 01/13/2021   History of embolic stroke 37/90/2409   Inflamed external hemorrhoid 12/08/2020   Insomnia due to anxiety and fear 12/08/2020   Long term (current) use of anticoagulants    Chronic HFrEF (heart failure with reduced ejection fraction) ([place])    Left basal ganglia embolic stroke ([place]) 73/53/2992   Malnutrition of moderate degree ([place]) 11/03/2020   Prolonged QT interval    Acute on chronic systolic (congestive) heart failure (HCC)    Persistent atrial fibrillation ([place]) 10/30/2020   Antibiotic-associated diarrhea 10/15/2020   ILD (interstitial lung disease) ([place]) 09/22/2020   Atrial flutter, paroxysmal ([place]) 09/10/2020   Tachyarrhythmia 09/08/2020   Sick sinus syndrome ([place]) 09/08/2020   Elevated serum free T4 level 09/08/2020   Acute respiratory failure with hypoxia ([place]) 08/28/2020   Purpura senilis ([place]) 08/28/2020   Hiatal hernia 08/09/2020   Atrial fibrillation with rapid ventricular response ([place]) 08/06/2020   Thrombophilia ([place]) 08/06/2020   Elevated troponin I level 05/21/2020   CVA (cerebral vascular accident) ([place]) 03/24/2020   History of COVID-19 03/03/2020   Bilateral carotid artery stenosis 12/23/2019   Schatzki's  ring of distal esophagus 12/11/2019   Erosive gastritis 11/30/2019   Dysphagia 11/27/2019   Painful swallowing 11/27/2019   Gastroesophageal reflux disease 11/27/2019   Stage 3a chronic kidney disease ([place]) 04/11/2019   Coagulopathy ([place]) 09/12/2018   Essential hypertension 42/68/3419   Chronic systolic CHF (congestive heart failure) ([place]) 08/15/2018   Hypothyroidism due to acquired atrophy of thyroid 05/08/2018   Unintentional weight loss 05/08/2018   Myalgia due to statin 05/08/2018   Abdominal aortic atherosclerosis ([place]) 05/07/2018   Bradycardia 04/28/2018   Pleural effusion on left 02/27/2018   Moderate mitral stenosis 01/10/2018   Lumbar radiculitis 01/07/2018   B12 deficiency 07/26/2017   Atrial fibrillation status post cardioversion ([place]) 04/06/2016   Hospital discharge follow-up 03/10/2016   Vitamin D deficiency 04/08/2015   Cervical spine degeneration 12/13/2014   History of pulmonary embolism 12/02/2014   Insomnia 10/04/2014   Moderate tricuspid insufficiency 09/17/2014   Generalized anxiety disorder 03/28/2013   Mitral valve prolapse 03/28/2013   Routine adult health maintenance 03/23/2012   Fatigue 03/23/2012   Cough 04/17/2011   Screening for breast cancer 11/29/2010   Macular degeneration, left eye 11/29/2010   Screening for colon cancer 11/29/2010   Hyperlipidemia LDL goal <70 11/29/2010   GERD (gastroesophageal reflux disease)    Social History   Tobacco Use   Smoking status: Never   Smokeless tobacco: Never   Tobacco comments:    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 , worked at [employer], [employer]  Substance Use Topics   Alcohol use: No   Allergies  Allergen Reactions   Statins     Severe myalgias   Current Meds  Medication Sig  acetaminophen (TYLENOL) 500 MG tablet Take 500 mg by mouth every 6 (six) hours as needed.   ALPRAZolam (XANAX) 0.25 MG tablet Take 0.5 tablets (0.125 mg total) by mouth 2 (two) times daily as  needed for anxiety.   amiodarone (PACERONE) 200 MG tablet Take 0.5 tablets (100 mg total) by mouth 2 (two) times daily. Home med.   budesonide (PULMICORT) 0.25 MG/2ML nebulizer solution Take 2 mLs (0.25 mg total) by nebulization 2 (two) times daily.   ferrous sulfate 325 (65 FE) MG EC tablet Take 1 tablet (325 mg total) by mouth 2 (two) times daily.   furosemide (LASIX) 20 MG tablet Take 2 tablets (40 mg total) by mouth daily.   levalbuterol (XOPENEX) 0.63 MG/3ML nebulizer solution Take 3 mLs (0.63 mg total) by nebulization 2 (two) times daily.   levothyroxine (EUTHYROX) 50 MCG tablet Take 1 tablet (50 mcg total) by mouth daily at 6 (six) AM.   mirtazapine (REMERON) 7.5 MG tablet Take 1 tablet (7.5 mg total) by mouth at bedtime.   pantoprazole (PROTONIX) 20 MG tablet Take 1 tablet (20 mg total) by mouth 2 (two) times daily before a meal.   Rivaroxaban (XARELTO) 15 MG TABS tablet Take 1 tablet (15 mg total) by mouth daily with supper.   senna-docusate (SENOKOT-S) 8.6-50 MG tablet Take 1 tablet by mouth 2 (two) times daily between meals as needed for mild constipation.   Immunization History  Administered Date(s) Administered   Pneumococcal Conjugate-13 12/11/2014   Pneumococcal Polysaccharide-23 07/13/2016       Objective:   Physical Exam BP 110/80 (BP Location: Left Arm, Patient Position: Sitting, Cuff Size: Normal)    Pulse (!) 50    Temp (!) 97.1 F (36.2 C) (Oral)    Ht 5\' 3"  (1.6 m)    Wt 105 lb (47.6 kg)    SpO2 97%    BMI 18.60 kg/m  GENERAL: [place] elderly woman, presents in transport chair.  No tachypnea.  Sallow appearance.  Looks uncomfortable. HEAD: Normocephalic, atraumatic.  EYES: Pupils equal, round, reactive to light.  No scleral icterus.  MOUTH: Oral mucosa moist. NECK: Supple. No thyromegaly. Trachea midline. No JVD.  No adenopathy. PULMONARY: Good air entry bilaterally.  No adventitious sounds. CARDIOVASCULAR: S1 and S2. Regular rate and rhythm.   2/6 systolic ejection murmur left sternal border ABDOMEN: Nondistended, soft, no hepatosplenomegaly.  No tenderness.  No masses. MUSCULOSKELETAL: No joint deformity, no clubbing, no edema.  Vertebral tenderness mid thoracic to lower thoracic mostly on the right. NEUROLOGIC: No focal deficit, speech is fluent. SKIN: Intact,warm,dry. PSYCH: Mood uncomfortable, behavior normal.       Assessment & Plan:

## 2021-04-06 NOTE — Telephone Encounter (Signed)
PC SW outreached patient to complete PC f/u call.  Call unsuccessful. SW lvm with contact info. Awaiting return call.

## 2021-04-06 NOTE — Patient Instructions (Signed)
We will see him in follow-up in 2 months time  Continue using your nebulizers for now  Once you get your esophagus stretched you can try switching the nebulizers to just as needed

## 2021-04-08 ENCOUNTER — Other Ambulatory Visit: Payer: Medicare HMO

## 2021-04-20 DIAGNOSIS — H353221 Exudative age-related macular degeneration, left eye, with active choroidal neovascularization: Secondary | ICD-10-CM | POA: Diagnosis not present

## 2021-04-28 ENCOUNTER — Telehealth: Payer: Self-pay | Admitting: Internal Medicine

## 2021-04-28 NOTE — Telephone Encounter (Signed)
Pt had a procedure to have a lodged piece of food removed from her esophageus back in January. The daughter stated that after the procedure the GI doctor said that the pt has a yeast infection in her esophageus. Daughter stated thaqt for the least 3 to 4 weeks pt has only been able to eat soup, toast and eggs because it is getting more difficult to swallow again. Daughter is wondering if the yeast infection could be getting worse and causing the difficulty swallowing. She stated that pt is starting to be afraid to eat because she feels like she may get choked or she start to have difficulty breathing.   ?

## 2021-04-28 NOTE — Telephone Encounter (Signed)
Pt daughter called in stating pt has troubling swallowing and it has gotten worse because no medication was given to her from the hospital when she had a procedure done. Now the pt is having a hard time swallowing and pt daughter would like a medication called to help her to swallow because they said she had a yeast infection.  ?

## 2021-04-29 NOTE — Telephone Encounter (Signed)
Spoke with pt's daughter ans she stated that the GI doctor is out of the office until next week. Daughter scheduled an appt with Dr. Derrel Nip for Monday.  ?

## 2021-05-03 ENCOUNTER — Ambulatory Visit (INDEPENDENT_AMBULATORY_CARE_PROVIDER_SITE_OTHER): Payer: Medicare HMO | Admitting: Internal Medicine

## 2021-05-03 ENCOUNTER — Other Ambulatory Visit: Payer: Self-pay

## 2021-05-03 VITALS — BP 148/70 | HR 49 | Temp 97.7°F | Ht 63.0 in | Wt 106.0 lb

## 2021-05-03 DIAGNOSIS — B3781 Candidal esophagitis: Secondary | ICD-10-CM | POA: Diagnosis not present

## 2021-05-03 DIAGNOSIS — M546 Pain in thoracic spine: Secondary | ICD-10-CM | POA: Diagnosis not present

## 2021-05-03 DIAGNOSIS — E034 Atrophy of thyroid (acquired): Secondary | ICD-10-CM

## 2021-05-03 DIAGNOSIS — E440 Moderate protein-calorie malnutrition: Secondary | ICD-10-CM | POA: Diagnosis not present

## 2021-05-03 DIAGNOSIS — I700 Atherosclerosis of aorta: Secondary | ICD-10-CM | POA: Diagnosis not present

## 2021-05-03 DIAGNOSIS — R634 Abnormal weight loss: Secondary | ICD-10-CM

## 2021-05-03 DIAGNOSIS — D6869 Other thrombophilia: Secondary | ICD-10-CM

## 2021-05-03 DIAGNOSIS — D501 Sideropenic dysphagia: Secondary | ICD-10-CM

## 2021-05-03 DIAGNOSIS — D692 Other nonthrombocytopenic purpura: Secondary | ICD-10-CM | POA: Diagnosis not present

## 2021-05-03 DIAGNOSIS — K2960 Other gastritis without bleeding: Secondary | ICD-10-CM

## 2021-05-03 DIAGNOSIS — J849 Interstitial pulmonary disease, unspecified: Secondary | ICD-10-CM

## 2021-05-03 HISTORY — DX: Candidal esophagitis: B37.81

## 2021-05-03 LAB — CBC WITH DIFFERENTIAL/PLATELET
Basophils Absolute: 0.1 10*3/uL (ref 0.0–0.1)
Basophils Relative: 1.3 % (ref 0.0–3.0)
Eosinophils Absolute: 0.1 10*3/uL (ref 0.0–0.7)
Eosinophils Relative: 1.7 % (ref 0.0–5.0)
HCT: 38.4 % (ref 36.0–46.0)
Hemoglobin: 12.5 g/dL (ref 12.0–15.0)
Lymphocytes Relative: 18.4 % (ref 12.0–46.0)
Lymphs Abs: 0.9 10*3/uL (ref 0.7–4.0)
MCHC: 32.6 g/dL (ref 30.0–36.0)
MCV: 90 fl (ref 78.0–100.0)
Monocytes Absolute: 0.7 10*3/uL (ref 0.1–1.0)
Monocytes Relative: 14 % — ABNORMAL HIGH (ref 3.0–12.0)
Neutro Abs: 3 10*3/uL (ref 1.4–7.7)
Neutrophils Relative %: 64.6 % (ref 43.0–77.0)
Platelets: 220 10*3/uL (ref 150.0–400.0)
RBC: 4.26 Mil/uL (ref 3.87–5.11)
RDW: 17.4 % — ABNORMAL HIGH (ref 11.5–15.5)
WBC: 4.6 10*3/uL (ref 4.0–10.5)

## 2021-05-03 LAB — IBC + FERRITIN
Ferritin: 75.9 ng/mL (ref 10.0–291.0)
Iron: 86 ug/dL (ref 42–145)
Saturation Ratios: 25.1 % (ref 20.0–50.0)
TIBC: 343 ug/dL (ref 250.0–450.0)
Transferrin: 245 mg/dL (ref 212.0–360.0)

## 2021-05-03 LAB — TSH: TSH: 3.09 u[IU]/mL (ref 0.35–5.50)

## 2021-05-03 LAB — B12 AND FOLATE PANEL
Folate: >24.2 ng/mL (ref >5.9)
Vitamin B-12: 635 pg/mL (ref 211–911)

## 2021-05-03 MED ORDER — NYSTATIN 100000 UNIT/ML MT SUSP: 5.0000 mL | Freq: Four times a day (QID) | OROMUCOSAL | 0 refills | Status: DC

## 2021-05-03 MED ORDER — MIRTAZAPINE 15 MG PO TABS: 15.0000 mg | ORAL_TABLET | Freq: Every day | ORAL | 2 refills | Status: DC

## 2021-05-03 NOTE — Assessment & Plan Note (Signed)
secondary to atrial fibrillation.  She is tolerating use of Xarelto for embolic stroke risk mitigation due to  atrial fibrillation. Patient has no signs of bleeding and is advised to notify her specialists prior to any procedure that may required suspension of Xarelto  ?

## 2021-05-03 NOTE — Assessment & Plan Note (Signed)
Improved symptoms with protonix started during hospitalization ?

## 2021-05-03 NOTE — Assessment & Plan Note (Signed)
Treatment delayed ; she was called last week by GI and has not started the 14 day course of fluconazole. .  Adding nystatin suspension and suspending budesonide until symptoms have resulted  ?

## 2021-05-03 NOTE — Assessment & Plan Note (Addendum)
Secondary to vertebral fracture.  Kyphoplasty was done .  Pain has been relieved  ?

## 2021-05-03 NOTE — Patient Instructions (Addendum)
I have added nystatin suspension to use 4 times daily (swish and swallow) for the next week to treat the yeast in your esophagus ?Use the fluconazole as well , (the one prescribed by Dr Alice Reichert) ? ? ?Suspend the budesonide  breathing treatment for now, because this is a steroid and may have caused the yeast in your esophagus.  You can continue the levalbuterol ; it will not cause yeast.  ? ?When you resume using the budesonide ,  I recommend taking a dose of the nystatin after each dose  ? ? ?I have Increased mirtazapine dose to 15 mg nightly  ? ? ?MOISTEN EVERY MORSEL OF FOOD WITH SOUP OR WARM WATER BEFORE SWALLOWING  ?

## 2021-05-03 NOTE — Progress Notes (Signed)
? ?Subjective:  ?Patient ID: Frances Kent, female    DOB: 1930-10-19  Age: 88 y.o. MRN: [id number] ? ?CC: The primary encounter diagnosis was Hypothyroidism due to acquired atrophy of thyroid. Diagnoses of Candida esophagitis ([place]), Malnutrition of moderate degree ([place]), Abdominal aortic atherosclerosis ([place]), ILD (interstitial lung disease) ([place]), Purpura senilis ([place]), Iron deficiency anemia due to sideropenic dysphagia, Acute right-sided thoracic back pain, Erosive gastritis, Acquired thrombophilia ([place]), and Unintentional weight loss were also pertinent to this visit. ? ? ?This visit occurred during the SARS-CoV-2 public health emergency.  Safety protocols were in place, including screening questions prior to the visit, additional usage of staff PPE, and extensive cleaning of exam room while observing appropriate contact time as indicated for disinfecting solutions.  Accommpanied by [name] her daughter  ? ?HPI ?Frances Kent presents for follow up on difficulty swallowing, weight loss,  and other issues  ? ? ? ?Treated in ER on Jan 25 for esophageal obstruction.  EGD was done by [facility]: ? ?"Food in the lower third of the esophagus. Removal was done by pushing food into the stomach using the ENDO tip  .               ?                       - Esophageal plaques were found, suspicious for  candidiasis. Cells for cytology obtained. ?                       - Benign-appearing esophageal stenosis. Lesion not  amenable to dilation, and not attempted. ?                       - 2 cm hiatal hernia. ?                       - Erythematous mucosa in the antrum. ?                       - Normal examined duodenum. ?                       - The examination was otherwise normal. ? ?Treatment for confirmed candida esophagitis was delayed  for unclear reasons ,  she wasn't called by GI with results from Jan 25 procedure until  last week march 10 . And the medicati on was called in [place] has been limited to a very small  repertoire : tomato soup,  one scrambled egg, and toast.  Moistens each morsel with soup. Has been Gargling with vinegar and salt water.  ? ?Lab Results  ?Component Value Date  ? TSH 3.09 05/03/2021  ? ?Modified barium swallow done Feb 19 2021 during admission reviewed.prominent cervical vertebra noted but no narrowing. Marland Kitchen  apprehension a major contributor  ? ?Back pain has improved  ? ?Outpatient Medications Prior to Visit  ?Medication Sig Dispense Refill  ? acetaminophen (TYLENOL) 500 MG tablet Take 500 mg by mouth every 6 (six) hours as needed.    ? ALPRAZolam (XANAX) 0.25 MG tablet Take 0.5 tablets (0.125 mg total) by mouth 2 (two) times daily as needed for anxiety. 30 tablet 5  ? amiodarone (PACERONE) 200 MG tablet Take 0.5 tablets (100 mg total) by mouth 2 (two) times daily. Home med.    ? budesonide (PULMICORT) 0.25 MG/2ML  nebulizer solution Take 2 mLs (0.25 mg total) by nebulization 2 (two) times daily. 120 mL 2  ? ferrous sulfate 325 (65 FE) MG EC tablet Take 1 tablet (325 mg total) by mouth 2 (two) times daily. 180 tablet 1  ? levalbuterol (XOPENEX) 0.63 MG/3ML nebulizer solution Take 3 mLs (0.63 mg total) by nebulization 2 (two) times daily. 180 mL 2  ? levothyroxine (EUTHYROX) 50 MCG tablet Take 1 tablet (50 mcg total) by mouth daily at 6 (six) AM. 30 tablet 2  ? Multiple Vitamin (MULTIVITAMIN WITH MINERALS) TABS tablet Take 1 tablet by mouth daily.    ? pantoprazole (PROTONIX) 20 MG tablet Take 1 tablet (20 mg total) by mouth 2 (two) times daily before a meal. 180 tablet 0  ? Rivaroxaban (XARELTO) 15 MG TABS tablet Take 1 tablet (15 mg total) by mouth daily with supper. 30 tablet 0  ? senna-docusate (SENOKOT-S) 8.6-50 MG tablet Take 1 tablet by mouth 2 (two) times daily between meals as needed for mild constipation. 60 tablet 0  ? mirtazapine (REMERON) 7.5 MG tablet Take 1 tablet (7.5 mg total) by mouth at bedtime. 30 tablet 2  ? amoxicillin (AMOXIL) 500 MG capsule SMARTSIG:4 Capsule(s) By Mouth (Patient  not taking: Reported on 05/03/2021)    ? furosemide (LASIX) 20 MG tablet Take 2 tablets (40 mg total) by mouth daily. 90 tablet 1  ? ?No facility-administered medications prior to visit.  ? ? ?Review of Systems; ? ?Patient denies headache, fevers, malaise, unintentional weight loss, skin rash, eye pain, sinus congestion and sinus pain, sore throat, dysphagia,  hemoptysis , cough, dyspnea, wheezing, chest pain, palpitations, orthopnea, edema, abdominal pain, nausea, melena, diarrhea, constipation, flank pain, dysuria, hematuria, urinary  Frequency, nocturia, numbness, tingling, seizures,  Focal weakness, Loss of consciousness,  Tremor, insomnia, depression, anxiety, and suicidal ideation.   ? ? ? ?Objective:  ?BP (!) 148/70 (BP Location: Left Arm, Patient Position: Sitting, Cuff Size: Normal)   Pulse (!) 49   Temp 97.7 ?F (36.5 ?C) (Oral)   Ht '5\' 3"'$  (1.6 m)   Wt 106 lb (48.1 kg)   SpO2 96%   BMI 18.78 kg/m?  ? ?BP Readings from Last 3 Encounters:  ?05/03/21 (!) 148/70  ?04/06/21 110/80  ?03/10/21 136/64  ? ? ?Wt Readings from Last 3 Encounters:  ?05/03/21 106 lb (48.1 kg)  ?04/06/21 105 lb (47.6 kg)  ?03/02/21 107 lb 9.6 oz (48.8 kg)  ? ? ?General appearance: alert, cooperative and appears stated age ?Ears: normal TM's and external ear canals both ears ?Throat: lips, mucosa, and tongue normal; teeth and gums normal ?Neck: no adenopathy, no carotid bruit, supple, symmetrical, trachea midline and thyroid not enlarged, symmetric, no tenderness/mass/nodules ?Back: symmetric, no curvature. ROM normal. No CVA tenderness. ?Lungs: clear to auscultation bilaterally ?Heart: regular rate and rhythm, S1, S2 normal, no murmur, click, rub or gallop ?Abdomen: soft, non-tender; bowel sounds normal; no masses,  no organomegaly ?Pulses: 2+ and symmetric ?Skin: Skin color, texture, turgor normal. No rashes or lesions ?Lymph nodes: Cervical, supraclavicular, and axillary nodes normal. ? ?Lab Results  ?Component Value Date  ? HGBA1C  5.8 (H) 11/02/2020  ? HGBA1C 6.1 (H) 10/16/2020  ? ? ?Lab Results  ?Component Value Date  ? CREATININE 1.06 (H) 03/10/2021  ? CREATININE 1.06 03/08/2021  ? CREATININE 1.03 (H) 02/20/2021  ? ? ?Lab Results  ?Component Value Date  ? WBC 4.6 05/03/2021  ? HGB 12.5 05/03/2021  ? HCT 38.4 05/03/2021  ? PLT 220.0 05/03/2021  ?  GLUCOSE 106 (H) 03/10/2021  ? CHOL 144 11/02/2020  ? TRIG 76 11/02/2020  ? TRIG 72 11/02/2020  ? HDL 48 11/02/2020  ? LDLDIRECT 185.3 03/27/2013  ? [place] 81 11/02/2020  ? ALT 25 02/14/2021  ? AST 27 02/14/2021  ? NA 140 03/10/2021  ? K 4.6 03/10/2021  ? CL 106 03/10/2021  ? CREATININE 1.06 (H) 03/10/2021  ? BUN 16 03/10/2021  ? CO2 24 03/10/2021  ? TSH 3.09 05/03/2021  ? INR 3.0 (H) 10/15/2020  ? HGBA1C 5.8 (H) 11/02/2020  ? ? ?MR THORACIC SPINE WO CONTRAST ? ?Result Date: 03/16/2021 ?CLINICAL DATA:  Thoracic compression fracture. Mid back pain since fall in December with pain radiating to right leg. EXAM: MRI THORACIC SPINE WITHOUT CONTRAST TECHNIQUE: Multiplanar, multisequence MR imaging of the thoracic spine was performed. No intravenous contrast was administered. COMPARISON:  Thoracic spine radiograph 03/02/2021 FINDINGS: Alignment:  Physiologic. Vertebrae: There is a subacute incomplete burst type fracture of T11 that involves both the anterior and posterior wall and the superior endplate. There is 1-2 mm of retropulsion. Less than 25% height loss. Diffuse bone marrow edema. The other vertebral bodies show mild degenerative change without acute abnormality. There is a sclerotic focus in the T7 vertebral body. Cord:  Normal Paraspinal and other soft tissues: Small right pleural effusion Disc levels: T6-7: Small central disc protrusion without stenosis. T8-9: Small central disc protrusion without stenosis. T10-11: Small disc bulge without stenosis. The other disc levels are unremarkable. There is no spinal canal or neural foraminal stenosis. IMPRESSION: 1. Subacute incomplete burst type fracture  of T11 with 1-2 mm of retropulsion. No associated stenosis. 2. Mild multilevel thoracic degenerative disc disease without spinal canal or neural foraminal stenosis. 3. Small right pleural effusion. Electro

## 2021-05-03 NOTE — Assessment & Plan Note (Signed)
Aggravated by recent foodd impaction and esophageal pain. . Increase mirtazapine to 15 mg daily to stimulate appetite  ?

## 2021-05-13 ENCOUNTER — Telehealth: Payer: Self-pay | Admitting: Pulmonary Disease

## 2021-05-13 MED ORDER — LEVALBUTEROL HCL 1.25 MG/3ML IN NEBU: 1.2500 mg | INHALATION_SOLUTION | Freq: Three times a day (TID) | RESPIRATORY_TRACT | 2 refills | Status: DC | PRN

## 2021-05-13 NOTE — Telephone Encounter (Signed)
[name], Pamela(DPR) is aware of below message and voiced her understanding.  ?Xopenex sent to preferred pharmacy. ?Nothing further needed.  ? ?

## 2021-05-13 NOTE — Telephone Encounter (Signed)
Patient is requesting refill on Xopenex solution. This medication was not originally prescribed by our office.  ? ? ?Dr. Patsey Berthold, please advise.  ?

## 2021-05-13 NOTE — Telephone Encounter (Signed)
I noticed that she was recently treated for esophageal candidiasis.  Lets have her stop the budesonide.  The Xopenex was ordered when she was in the hospital.  We can switch it to Xopenex 1.25, 3 times a day as needed for shortness of breath.  This is a little stronger than what she was getting but will hopefully keep keep her doing well without the budesonide. ?

## 2021-05-14 ENCOUNTER — Telehealth: Payer: Self-pay | Admitting: Pulmonary Disease

## 2021-05-14 MED ORDER — LEVALBUTEROL HCL 0.63 MG/3ML IN NEBU: 0.6300 mg | INHALATION_SOLUTION | RESPIRATORY_TRACT | 6 refills | Status: DC | PRN

## 2021-05-14 MED ORDER — LEVALBUTEROL HCL 0.63 MG/3ML IN NEBU: 0.6300 mg | INHALATION_SOLUTION | Freq: Four times a day (QID) | RESPIRATORY_TRACT | 6 refills | Status: DC | PRN

## 2021-05-14 NOTE — Telephone Encounter (Signed)
Xopenex 0.63 sent to preferred pharmacy.  ?[name], [name] is aware and voiced her understanding.  ?Nothing further needed.  ? ?

## 2021-05-14 NOTE — Telephone Encounter (Signed)
Lets use the 0.63 up to 4 times a day as needed. ?

## 2021-05-14 NOTE — Telephone Encounter (Signed)
Spoke to [employer]. Xopenex 1.25 is on back order. [facility] has one box of 0.'63mg'$ . ? ?Dr. Patsey Berthold, please advise. Thanks ?

## 2021-05-14 NOTE — Addendum Note (Signed)
Addended by: Claudette Head A on: 05/14/2021 03:06 PM ? ? Modules accepted: Orders ? ?

## 2021-05-18 DIAGNOSIS — K222 Esophageal obstruction: Secondary | ICD-10-CM | POA: Diagnosis not present

## 2021-05-18 DIAGNOSIS — R1319 Other dysphagia: Secondary | ICD-10-CM | POA: Diagnosis not present

## 2021-05-18 DIAGNOSIS — K219 Gastro-esophageal reflux disease without esophagitis: Secondary | ICD-10-CM | POA: Diagnosis not present

## 2021-05-18 DIAGNOSIS — K449 Diaphragmatic hernia without obstruction or gangrene: Secondary | ICD-10-CM | POA: Diagnosis not present

## 2021-05-18 DIAGNOSIS — B3781 Candidal esophagitis: Secondary | ICD-10-CM | POA: Diagnosis not present

## 2021-05-24 ENCOUNTER — Encounter: Payer: Self-pay | Admitting: Internal Medicine

## 2021-05-24 ENCOUNTER — Ambulatory Visit (INDEPENDENT_AMBULATORY_CARE_PROVIDER_SITE_OTHER): Payer: Medicare HMO | Admitting: Internal Medicine

## 2021-05-24 VITALS — BP 144/70 | HR 54 | Temp 98.1°F | Ht 63.0 in | Wt 105.0 lb

## 2021-05-24 DIAGNOSIS — I10 Essential (primary) hypertension: Secondary | ICD-10-CM

## 2021-05-24 DIAGNOSIS — K2960 Other gastritis without bleeding: Secondary | ICD-10-CM | POA: Diagnosis not present

## 2021-05-24 DIAGNOSIS — J849 Interstitial pulmonary disease, unspecified: Secondary | ICD-10-CM

## 2021-05-24 DIAGNOSIS — I679 Cerebrovascular disease, unspecified: Secondary | ICD-10-CM

## 2021-05-24 DIAGNOSIS — I700 Atherosclerosis of aorta: Secondary | ICD-10-CM | POA: Diagnosis not present

## 2021-05-24 DIAGNOSIS — R262 Difficulty in walking, not elsewhere classified: Secondary | ICD-10-CM

## 2021-05-24 DIAGNOSIS — I5022 Chronic systolic (congestive) heart failure: Secondary | ICD-10-CM

## 2021-05-24 NOTE — Progress Notes (Signed)
? ?Subjective:  ?Patient ID: Frances Kent, female    DOB: [birth year]  Age: 88 y.o. MRN: [id number] ? ?CC: The primary encounter diagnosis was Difficulty walking. Diagnoses of Abdominal aortic atherosclerosis ([place]), Chronic HFrEF (heart failure with reduced ejection fraction) ([place]), Erosive gastritis, Essential hypertension, ILD (interstitial lung disease) ([place]), and Cerebrovascular disease were also pertinent to this visit. ? ? ?This visit occurred during the SARS-CoV-2 public health emergency.  Safety protocols were in place, including screening questions prior to the visit, additional usage of staff PPE, and extensive cleaning of exam room while observing appropriate contact time as indicated for disinfecting solutions.   ? ?HPI ?[name] presents for  3 month follow up on unintentional weight loss , dysphagia and anorexia secondary to candidal esophagitis and other issues ?Chief Complaint  ?Patient presents with  ? Follow-up  ?  3 month follow up   ? ?1) Dysphagia, with stricture : with history of esophageal stricture not amenable to stretching by last EGD jan 2023,  seen by GI last week,  was not consistent  with use of protonix 20 mg  daily,  advised to increase to bid  last week by GI.  Barium study to be repeated if symptoms persist. H ? ?2) Protein calorie malnutrition:  She has lost 1 lbs since last visit 3 weeks ago  . Drinking activia yogurt drinks twice daily .  Nutritional support still limited by limited preferences and issues with textures resulting in dysphagia  ? ? ?EGD: 03/10/21 - food found in lower third of esophagus pushed into the stomach, esophageal candidiasis, distal esophageal stricture not amendable to dilatation and not attempted, 2 cm hiatal hernia, gastritis, normal examined duodenum  ?2) Chronic fatigue : multifactorial. She has ILD and valvular cardiomyopathy (severe MS) with slightly reduced EF 40 to 45%.  . She is not hypoxic with leisurely activities per daughter.    She  has not been walking much due to fear of falling.  ? ?Outpatient Medications Prior to Visit  ?Medication Sig Dispense Refill  ? acetaminophen (TYLENOL) 500 MG tablet Take 500 mg by mouth every 6 (six) hours as needed.    ? ALPRAZolam (XANAX) 0.25 MG tablet Take 0.5 tablets (0.125 mg total) by mouth 2 (two) times daily as needed for anxiety. 30 tablet 5  ? amiodarone (PACERONE) 200 MG tablet Take 0.5 tablets (100 mg total) by mouth 2 (two) times daily. Home med.    ? ferrous sulfate 325 (65 FE) MG EC tablet Take 1 tablet (325 mg total) by mouth 2 (two) times daily. 180 tablet 1  ? levalbuterol (XOPENEX) 0.63 MG/3ML nebulizer solution Take 3 mLs (0.63 mg total) by nebulization every 6 (six) hours as needed for wheezing or shortness of breath. 330 mL 6  ? mirtazapine (REMERON) 15 MG tablet Take 1 tablet (15 mg total) by mouth at bedtime. 30 tablet 2  ? Multiple Vitamin (MULTIVITAMIN WITH MINERALS) TABS tablet Take 1 tablet by mouth daily.    ? nystatin (MYCOSTATIN) 100000 UNIT/ML suspension Take 5 mLs (500,000 Units total) by mouth 4 (four) times daily. 60 mL 0  ? Rivaroxaban (XARELTO) 15 MG TABS tablet Take 1 tablet (15 mg total) by mouth daily with supper. 30 tablet 0  ? senna-docusate (SENOKOT-S) 8.6-50 MG tablet Take 1 tablet by mouth 2 (two) times daily between meals as needed for mild constipation. 60 tablet 0  ? furosemide (LASIX) 20 MG tablet Take 2 tablets (40 mg total) by mouth daily. [place]  tablet 1  ? levothyroxine (EUTHYROX) 50 MCG tablet Take 1 tablet (50 mcg total) by mouth daily at 6 (six) AM. 30 tablet 2  ? pantoprazole (PROTONIX) 20 MG tablet Take 1 tablet (20 mg total) by mouth 2 (two) times daily before a meal. 180 tablet 0  ? amoxicillin (AMOXIL) 500 MG capsule SMARTSIG:4 Capsule(s) By Mouth (Patient not taking: Reported on 05/03/2021)    ? ?No facility-administered medications prior to visit.  ? ? ?Review of Systems; ? ?Patient denies headache, fevers, malaise, unintentional weight loss, skin rash, eye  pain, sinus congestion and sinus pain, sore throat, dysphagia,  hemoptysis , cough, dyspnea, wheezing, chest pain, palpitations, orthopnea, edema, abdominal pain, nausea, melena, diarrhea, constipation, flank pain, dysuria, hematuria, urinary  Frequency, nocturia, numbness, tingling, seizures,  Focal weakness, Loss of consciousness,  Tremor, insomnia, depression, anxiety, and suicidal ideation.   ? ? ? ?Objective:  ?BP (!) 144/70 (BP Location: Left Arm, Patient Position: Sitting, Cuff Size: Normal)   Pulse (!) 54   Temp 98.1 ?F (36.7 ?C) (Oral)   Ht '5\' 3"'$  (1.6 m)   Wt 105 lb (47.6 kg)   SpO2 95%   BMI 18.60 kg/m?  ? ?BP Readings from Last 3 Encounters:  ?05/24/21 (!) 144/70  ?05/03/21 (!) 148/70  ?04/06/21 110/80  ? ? ?Wt Readings from Last 3 Encounters:  ?05/24/21 105 lb (47.6 kg)  ?05/03/21 106 lb (48.1 kg)  ?04/06/21 105 lb (47.6 kg)  ? ? ?General appearance: alert, cooperative and appears stated age ?Ears: normal TM's and external ear canals both ears ?Throat: lips, mucosa, and tongue normal; teeth and gums normal ?Neck: no adenopathy, no carotid bruit, supple, symmetrical, trachea midline and thyroid not enlarged, symmetric, no tenderness/mass/nodules ?Back: symmetric, no curvature. ROM normal. No CVA tenderness. ?Lungs: clear to auscultation bilaterally ?Heart: regular rate and rhythm, S1, S2 normal, no murmur, click, rub or gallop ?Abdomen: soft, non-tender; bowel sounds normal; no masses,  no organomegaly ?Pulses: 2+ and symmetric ?Skin: Skin color, texture, turgor normal. No rashes or lesions ?Lymph nodes: Cervical, supraclavicular, and axillary nodes normal. ? ?Lab Results  ?Component Value Date  ? HGBA1C 5.8 (H) 11/02/2020  ? HGBA1C 6.1 (H) 10/16/2020  ? ? ?Lab Results  ?Component Value Date  ? CREATININE 1.06 (H) 03/10/2021  ? CREATININE 1.06 03/08/2021  ? CREATININE 1.03 (H) 02/20/2021  ? ? ?Lab Results  ?Component Value Date  ? WBC 4.6 05/03/2021  ? HGB 12.5 05/03/2021  ? HCT 38.4 05/03/2021  ?  PLT 220.0 05/03/2021  ? GLUCOSE 106 (H) 03/10/2021  ? CHOL 144 11/02/2020  ? TRIG 76 11/02/2020  ? TRIG 72 11/02/2020  ? HDL 48 11/02/2020  ? LDLDIRECT 185.3 03/27/2013  ? [place] 81 11/02/2020  ? ALT 25 02/14/2021  ? AST 27 02/14/2021  ? NA 140 03/10/2021  ? K 4.6 03/10/2021  ? CL 106 03/10/2021  ? CREATININE 1.06 (H) 03/10/2021  ? BUN 16 03/10/2021  ? CO2 24 03/10/2021  ? TSH 3.09 05/03/2021  ? INR 3.0 (H) 10/15/2020  ? HGBA1C 5.8 (H) 11/02/2020  ? ? ?MR THORACIC SPINE WO CONTRAST ? ?Result Date: 03/16/2021 ?CLINICAL DATA:  Thoracic compression fracture. Mid back pain since fall in December with pain radiating to right leg. EXAM: MRI THORACIC SPINE WITHOUT CONTRAST TECHNIQUE: Multiplanar, multisequence MR imaging of the thoracic spine was performed. No intravenous contrast was administered. COMPARISON:  Thoracic spine radiograph 03/02/2021 FINDINGS: Alignment:  Physiologic. Vertebrae: There is a subacute incomplete burst type fracture of T11  that involves both the anterior and posterior wall and the superior endplate. There is 1-2 mm of retropulsion. Less than 25% height loss. Diffuse bone marrow edema. The other vertebral bodies show mild degenerative change without acute abnormality. There is a sclerotic focus in the T7 vertebral body. Cord:  Normal Paraspinal and other soft tissues: Small right pleural effusion Disc levels: T6-7: Small central disc protrusion without stenosis. T8-9: Small central disc protrusion without stenosis. T10-11: Small disc bulge without stenosis. The other disc levels are unremarkable. There is no spinal canal or neural foraminal stenosis. IMPRESSION: 1. Subacute incomplete burst type fracture of T11 with 1-2 mm of retropulsion. No associated stenosis. 2. Mild multilevel thoracic degenerative disc disease without spinal canal or neural foraminal stenosis. 3. Small right pleural effusion. Electronically Signed   By: Ulyses Jarred M.D.   On: 03/16/2021 14:56  ? ? ?Assessment & Plan:   ? ?Problem List Items Addressed This Visit   ? ? Abdominal aortic atherosclerosis ([place])  ?  UNTREATED due to statin myalgia  ?  ?  ? Cerebrovascular disease  ?  History of left basal ganglia infarct .  ?  ?  ?

## 2021-05-24 NOTE — Assessment & Plan Note (Signed)
Reviewed ECHO from Sept 2022.  No barriers to starting a walking program  ?

## 2021-05-24 NOTE — Patient Instructions (Signed)
You could gain more weight if you were LESS PICKY!! ? ?Try [employer]!  YOU NEED MORE CALORIES AND MORE PROTEIN  ? ?THE PROTONIX IS CRUCIAL TO SUPPRESS YOUR STOMACH ACID ? ? ?GET OUT OF THE HOUSE FOR A WALK.  5 MINUTES TO START ? ?

## 2021-05-24 NOTE — Assessment & Plan Note (Signed)
UNTREATED due to statin myalgia  ?

## 2021-05-24 NOTE — Assessment & Plan Note (Addendum)
Reminded to continue protonix 20 mg bid for life.  ?

## 2021-05-24 NOTE — Assessment & Plan Note (Signed)
Well controlled on current regimen of amlodipine, metoprolol, and telmisartan. Renal function stable, no changes today. ?

## 2021-05-24 NOTE — Assessment & Plan Note (Signed)
History of left basal ganglia infarct .  ?

## 2021-05-24 NOTE — Assessment & Plan Note (Addendum)
She has been referred to Pulmonary and is seeing Dr Patsey Berthold  ?

## 2021-05-27 ENCOUNTER — Telehealth: Payer: Self-pay | Admitting: Internal Medicine

## 2021-05-27 NOTE — Telephone Encounter (Signed)
Pt daughter called in requesting refill on medication (Expired - levothyroxine (EUTHYROX) 50 MCG tablet)... Advise pt daughter that medication was prescribe by another doctor... Pt daughter stated that pt advised her that Dr. Derrel Nip prescribe medication to her... Pt daughter looked at prescription bottle and Dr. Derrel Nip name was not on prescription bottle... Pt daughter stated that pt was recently seen by Dr. Derrel Nip... Pt daughter stated that she did inform Dr. Derrel Nip about prescription needing refill... Pt daughter requesting callback ?

## 2021-05-28 MED ORDER — LEVOTHYROXINE SODIUM 50 MCG PO TABS: 50.0000 ug | ORAL_TABLET | Freq: Every day | ORAL | 1 refills | Status: DC

## 2021-05-28 NOTE — Telephone Encounter (Signed)
Pt's daughter call requested a refill on levothyroxine. The prescription shows expired and last filled by a different provider. Pt is currently taking. Is it okay to refill?  ? ?Refilled: 02/10/2021 ?Last OV: 05/24/2021 ?Next OV: 08/23/2021 ?

## 2021-05-28 NOTE — Telephone Encounter (Signed)
Pt daughter called in stating that she is at pharmacy and pharmacy advised her that they have reached out to our office to get approval for manufacture change on medication (levothyroxine (EUTHYROX) 50 MCG tablet)... Pt daughter is requesting callback ?

## 2021-05-28 NOTE — Telephone Encounter (Signed)
Thyroid med refilled for 90 days and sent to t [facility] pharmacy ?

## 2021-05-31 ENCOUNTER — Other Ambulatory Visit: Payer: Self-pay | Admitting: Internal Medicine

## 2021-05-31 NOTE — Telephone Encounter (Signed)
Pt daughter is calling about previous message sent ?

## 2021-05-31 NOTE — Telephone Encounter (Signed)
Verbal okay given to change the manufacturer and patients daughter is aware.  ?

## 2021-06-08 DIAGNOSIS — H353212 Exudative age-related macular degeneration, right eye, with inactive choroidal neovascularization: Secondary | ICD-10-CM | POA: Diagnosis not present

## 2021-06-11 ENCOUNTER — Ambulatory Visit (INDEPENDENT_AMBULATORY_CARE_PROVIDER_SITE_OTHER): Payer: Medicare HMO

## 2021-06-11 VITALS — Ht 63.0 in | Wt 105.0 lb

## 2021-06-11 DIAGNOSIS — Z0000 Encounter for general adult medical examination without abnormal findings: Secondary | ICD-10-CM | POA: Diagnosis not present

## 2021-06-11 NOTE — Progress Notes (Addendum)
Subjective:   Frances Kent is a 88 y.o. female who presents for Medicare Annual (Subsequent) preventive examination.  Review of Systems    No ROS.  Medicare Wellness Virtual Visit.  Visual/audio telehealth visit, UTA vital signs.   See social history for additional risk factors.   Cardiac Risk Factors include: advanced age (>105men, >33 women)     Objective:    Today's Vitals   06/11/21 1547  Weight: 105 lb (47.6 kg)  Height: 5\' 3"  (1.6 m)   Body mass index is 18.6 kg/m.     06/11/2021    3:51 PM 03/10/2021   10:44 AM 02/15/2021    9:30 PM 02/14/2021    6:27 PM 02/02/2021   11:00 PM 02/02/2021    9:55 AM 01/12/2021    6:00 PM  Advanced Directives  Does Patient Have a Medical Advance Directive? Yes Yes Yes No  No No  Type of Estate agent of [place];Living will Healthcare Power of [employer] Power of Attorney      Does patient want to make changes to medical advance directive? No - Patient declined No - Patient declined No - Patient declined      Copy of Healthcare Power of Attorney in Chart? Yes - validated most recent copy scanned in chart (See row information)        Would patient like information on creating a medical advance directive?  No - Patient declined  No - Patient declined No - Patient declined  No - Patient declined    Current Medications (verified) Outpatient Encounter Medications as of 06/11/2021  Medication Sig   acetaminophen (TYLENOL) 500 MG tablet Take 500 mg by mouth every 6 (six) hours as needed.   ALPRAZolam (XANAX) 0.25 MG tablet Take 0.5 tablets (0.125 mg total) by mouth 2 (two) times daily as needed for anxiety.   amiodarone (PACERONE) 200 MG tablet Take 0.5 tablets (100 mg total) by mouth 2 (two) times daily. Home med.   ferrous sulfate 325 (65 FE) MG EC tablet Take 1 tablet (325 mg total) by mouth 2 (two) times daily.   furosemide (LASIX) 20 MG tablet Take 2 tablets (40 mg total) by mouth daily.   levalbuterol  (XOPENEX) 0.63 MG/3ML nebulizer solution Take 3 mLs (0.63 mg total) by nebulization every 6 (six) hours as needed for wheezing or shortness of breath.   levothyroxine (EUTHYROX) 50 MCG tablet Take 1 tablet (50 mcg total) by mouth daily at 6 (six) AM.   mirtazapine (REMERON) 15 MG tablet Take 1 tablet (15 mg total) by mouth at bedtime.   Multiple Vitamin (MULTIVITAMIN WITH MINERALS) TABS tablet Take 1 tablet by mouth daily.   nystatin (MYCOSTATIN) 100000 UNIT/ML suspension Take 5 mLs (500,000 Units total) by mouth 4 (four) times daily.   pantoprazole (PROTONIX) 20 MG tablet Take 1 tablet (20 mg total) by mouth 2 (two) times daily before a meal.   senna-docusate (SENOKOT-S) 8.6-50 MG tablet Take 1 tablet by mouth 2 (two) times daily between meals as needed for mild constipation.   XARELTO 15 MG TABS tablet TAKE 1 TABLET EVERY DAY   No facility-administered encounter medications on file as of 06/11/2021.    Allergies (verified) Statins   History: Past Medical History:  Diagnosis Date   Atrial fibrillation (HCC)    Benign breast cyst in female, left 10/07/2016   CAP (community acquired pneumonia) 02/15/2021   Colon adenomas    GERD (gastroesophageal reflux disease)    HCAP (healthcare-associated pneumonia)  02/02/2021   Hypertension    Hypothyroidism    Macular degeneration    Mitral regurgitation    Pulmonary embolism (HCC) 02/2016   SVT (supraventricular tachycardia) [facility])    Past Surgical History:  Procedure Laterality Date   APPENDECTOMY  1960   BREAST CYST ASPIRATION Left 2017   CARDIOVERSION N/A 04/05/2016   Procedure: Cardioversion;  Surgeon: Lamar Blinks, MD;  Location: [facility] ORS;  Service: Cardiovascular;  Laterality: N/A;   CHOLECYSTECTOMY  1985   ESOPHAGOGASTRODUODENOSCOPY N/A 03/10/2021   Procedure: ESOPHAGOGASTRODUODENOSCOPY (EGD);  Surgeon: Toledo, Boykin Nearing, MD;  Location: [facility] ENDOSCOPY;  Service: Gastroenterology;  Laterality: N/A;   ESOPHAGOGASTRODUODENOSCOPY (EGD)  WITH PROPOFOL N/A 11/27/2019   Procedure: ESOPHAGOGASTRODUODENOSCOPY (EGD) WITH PROPOFOL;  Surgeon: Regis Bill, MD;  Location: [facility] ENDOSCOPY;  Service: Endoscopy;  Laterality: N/A;   ESOPHAGOGASTRODUODENOSCOPY (EGD) WITH PROPOFOL N/A 12/24/2019   Procedure: ESOPHAGOGASTRODUODENOSCOPY (EGD) WITH PROPOFOL;  Surgeon: Regis Bill, MD;  Location: [facility] ENDOSCOPY;  Service: Endoscopy;  Laterality: N/A;   IR CT HEAD LTD  11/01/2020   IR PERCUTANEOUS ART THROMBECTOMY/INFUSION INTRACRANIAL INC DIAG ANGIO  11/01/2020   IR US GUIDE VASC ACCESS RIGHT  11/02/2020   OVARIAN CYST REMOVAL     RADIOLOGY WITH ANESTHESIA N/A 11/01/2020   Procedure: RADIOLOGY WITH ANESTHESIA;  Surgeon: Julieanne Cotton, MD;  Location: [facility];  Service: Radiology;  Laterality: N/A;   TEE WITHOUT CARDIOVERSION N/A 02/01/2018   Procedure: TRANSESOPHAGEAL ECHOCARDIOGRAM (TEE);  Surgeon: Lamar Blinks, MD;  Location: [facility] ORS;  Service: Cardiovascular;  Laterality: N/A;   Family History  Problem Relation Age of Onset   Cancer Mother 104       breast cancer, lived to 75,    Breast cancer Mother 84   Cancer Son 67       pancreatic cancer   Heart disease Son        CAD, Tobacco Abuse    Heart disease Maternal Grandmother 51       died of massive MI   Stroke Sister    Social History   Socioeconomic History   Marital status: Widowed    Spouse name: Not on file   Number of children: Not on file   Years of education: Not on file   Highest education level: Not on file  Occupational History   Not on file  Tobacco Use   Smoking status: Never   Smokeless tobacco: Never   Tobacco comments:    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 , worked at [employer], Anim[employer] Use: Never used  Substance and Sexual Activity   Alcohol use: No   Drug use: No   Sexual activity: Not Currently  Other Topics Concern   Not on file  Social History Narrative   Has caretaking responsibility for 3 young grandchildren who live  with her   Social Determinants of Health   Financial Resource Strain: Low Risk    Difficulty of Paying Living Expenses: Not hard at all  Food Insecurity: No Food Insecurity   Worried About Programme researcher, broadcasting/film/video in the Last Year: Never true   Barista in the Last Year: Never true  Transportation Needs: No Transportation Needs   Lack of Transportation (Medical): No   Lack of Transportation (Non-Medical): No  Physical Activity: Not on file  Stress: No Stress Concern Present   Feeling of Stress : Not at all  Social Connections: Unknown   Frequency of Communication with Friends and Family: More  than three times a week   Frequency of Social Gatherings with Friends and Family: More than three times a week   Attends Religious Services: Not on file   Active Member of Clubs or Organizations: Not on file   Attends Banker Meetings: Not on file   Marital Status: Widowed   Tobacco Counseling Counseling given: Not Answered Tobacco comments: 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 , worked at [employer], [employer] Intake:  Pre-visit preparation completed: Yes        Diabetes: No  How often do you need to have someone help you when you read instructions, pamphlets, or other written materials from your doctor or pharmacy?: 3 - Sometimes   Interpreter Needed?: No    Activities of Daily Living    06/11/2021    3:52 PM 02/15/2021    9:30 PM  In your present state of health, do you have any difficulty performing the following activities:  Hearing? 0 0  Vision? 0 0  Difficulty concentrating or making decisions? 0 0  Comment Age appropriate.   Walking or climbing stairs? 1 1  Comment Walker in use as needed   Dressing or bathing? 0 1  Doing errands, shopping? 1 1  Comment Family Advice worker and eating ? Y   Comment Daughter assist   Using the Toilet? N   In the past six months, have you accidently leaked urine? N   Do you have problems with loss of bowel  control? N   Managing your Medications? Y   Comment Daughter assist   Managing your Finances? Y   Comment Daughter assist   Housekeeping or managing your Housekeeping? Y   Comment Daughter assist    Patient Care Team: Sherlene Shams, MD as PCP - General (Internal Medicine) Sherlene Shams, MD (Internal Medicine) Rickard Patience, MD as Consulting Physician (Hematology and Oncology)  Indicate any recent Medical Services you may have received from other than [facility] providers in the past year (date may be approximate).     Assessment:   This is a routine wellness examination for Kristiane.  Virtual Visit via Telephone Note  I connected with  Zadie Rhine on 06/11/21 at  3:45 PM EDT by telephone and verified that I am speaking with the correct person using two identifiers.  Persons participating in the virtual visit: patient/Nurse Health Advisor   I discussed the limitations of performing an evaluation and management service by telehealth. The patient expressed understanding and agreed to proceed.We continued and completed visit with audio only. Some vital signs may be absent or patient reported.   Hearing/Vision screen Hearing Screening - Comments:: Patient is able to hear conversational tones without difficulty. No issues reported.   Dietary issues and exercise activities discussed: Current Exercise Habits: Home exercise routine, Type of exercise: walking, Frequency (Times/Week): 7, Intensity: Mild Healthy diet Good water intake   Goals Addressed             This Visit's Progress    Healthy Lifestyle       Stay hydrated and drink plenty of fluids Low carb foods.  Lean meats and vegetables Stay active and walk for exercise as tolerated       Depression Screen    06/11/2021    3:50 PM 05/24/2021    2:04 PM 02/23/2021    2:46 PM 12/07/2020   11:31 AM 09/22/2020    1:10 PM 09/11/2020    3:06 PM 08/26/2020    2:43  PM  PHQ 2/9 Scores  PHQ - 2 Score 0 0 3 0 0 0 0  PHQ- 9 Score 0  0 12        Fall Risk    06/11/2021    3:52 PM 05/24/2021    2:04 PM 05/03/2021   11:12 AM 02/23/2021    2:21 PM 01/25/2021   11:11 AM  Fall Risk   Falls in the past year?   0 1 0  Number falls in past yr:  0  0 0  Injury with Fall?  1  1 0  Risk for fall due to :  History of fall(s) Impaired mobility;History of fall(s) History of fall(s) Impaired balance/gait;Impaired mobility  Follow up Falls evaluation completed Falls evaluation completed Falls evaluation completed Falls evaluation completed Falls evaluation completed    FALL RISK PREVENTION PERTAINING TO THE HOME: Home free of loose throw rugs in walkways, pet beds, electrical cords, etc? Yes  Adequate lighting in your home to reduce risk of falls? Yes   ASSISTIVE DEVICES UTILIZED TO PREVENT FALLS: Life alert? No  Use of a cane, walker or w/c? Yes , walker as needed.  TIMED UP AND GO: Was the test performed? No .   Cognitive Function: Patient is alert and oriented x3.     09/11/2019    9:13 AM 06/14/2016   11:24 AM 06/15/2015   11:35 AM  MMSE - Mini Mental State Exam  Not completed: Unable to complete    Orientation to time  5 5  Orientation to Place  5 5  Registration  3 3  Attention/ Calculation  5 5  Recall  3 3  Language- name 2 objects  2 2  Language- repeat  1 1  Language- follow 3 step command  3 3  Language- read & follow direction  1 1  Write a sentence  1 1  Copy design  1 1  Total score  30 30        09/10/2018   12:30 PM 06/15/2017   11:40 AM  6CIT Screen  What Year? 0 points 0 points  What month? 0 points 0 points  What time? 0 points 0 points  Count back from 20 0 points 0 points  Months in reverse 0 points 0 points  Repeat phrase 0 points 0 points  Total Score 0 points 0 points    Immunizations Immunization History  Administered Date(s) Administered   Pneumococcal Conjugate-13 12/11/2014   Pneumococcal Polysaccharide-23 07/13/2016   Covid and shingrix vaccine discontinued per patient.    Screening Tests Health Maintenance  Topic Date Due   TETANUS/TDAP  12/07/2021 (Originally 08/31/1949)   INFLUENZA VACCINE  09/14/2021   Pneumonia Vaccine 58+ Years old  Completed   DEXA SCAN  Completed   HPV VACCINES  Aged Out   COVID-19 Vaccine  Discontinued   Zoster Vaccines- Shingrix  Discontinued   Health Maintenance There are no preventive care reminders to display for this patient.  Lung Cancer Screening: (Low Dose CT Chest recommended if Age 58-80 years, 30 pack-year currently smoking OR have quit w/in 15years.) does not qualify.   Hepatitis C Screening: does not qualify  Vision Screening: Recommended annual ophthalmology exams for early detection of glaucoma and other disorders of the eye.  Dental Screening: Recommended annual dental exams for proper oral hygiene  Community Resource Referral / Chronic Care Management: CRR required this visit?  No   CCM required this visit?  No  Plan:   Keep all routine maintenance appointments.   I have personally reviewed and noted the following in the patient's chart:   Medical and social history Use of alcohol, tobacco or illicit drugs  Current medications and supplements including opioid prescriptions.  Functional ability and status Nutritional status Physical activity Advanced directives List of other physicians Hospitalizations, surgeries, and ER visits in previous 12 months Vitals Screenings to include cognitive, depression, and falls Referrals and appointments  In addition, I have reviewed and discussed with patient certain preventive protocols, quality metrics, and best practice recommendations. A written personalized care plan for preventive services as well as general preventive health recommendations were provided to patient.     OBrien-Blaney, Jarelle Ates L, LPN   1/61/0960       I have reviewed the above information and agree with above.   Duncan Dull, MD

## 2021-06-11 NOTE — Patient Instructions (Addendum)
?  Frances Kent , ?Thank you for taking time to come for your Medicare Wellness Visit. I appreciate your ongoing commitment to your health goals. Please review the following plan we discussed and let me know if I can assist you in the future.  ? ?These are the goals we discussed: ? Goals   ? ?  Healthy Lifestyle   ?  Stay hydrated and drink plenty of fluids ?Low carb foods.  Lean meats and vegetables ?Stay active and walk for exercise as tolerated ?  ? ?  ?  ?This is a list of the screening recommended for you and due dates:  ?Health Maintenance  ?Topic Date Due  ? Tetanus Vaccine  12/07/2021*  ? Flu Shot  09/14/2021  ? Pneumonia Vaccine  Completed  ? DEXA scan (bone density measurement)  Completed  ? HPV Vaccine  Aged Out  ? COVID-19 Vaccine  Discontinued  ? Zoster (Shingles) Vaccine  Discontinued  ?*Topic was postponed. The date shown is not the original due date.  ?  ?

## 2021-06-29 ENCOUNTER — Telehealth: Payer: Self-pay | Admitting: Pulmonary Disease

## 2021-06-29 MED ORDER — LEVALBUTEROL HCL 0.63 MG/3ML IN NEBU: 0.6300 mg | INHALATION_SOLUTION | Freq: Four times a day (QID) | RESPIRATORY_TRACT | 0 refills | Status: DC | PRN

## 2021-06-29 NOTE — Telephone Encounter (Signed)
Xopenex sent to preferred pharmacy.  ?[name], [name](DPR) is aware and voiced her understanding.  ?Nothing further needed.  ? ?

## 2021-07-01 ENCOUNTER — Other Ambulatory Visit: Payer: Self-pay

## 2021-07-01 DIAGNOSIS — Z515 Encounter for palliative care: Secondary | ICD-10-CM

## 2021-07-01 NOTE — Progress Notes (Signed)
COMMUNITY PALLIATIVE CARE SW NOTE  PATIENT NAME: Frances Kent DOB: Nov 14, 1930 MRN: [id number]  PRIMARY CARE PROVIDER: [name], MD  RESPONSIBLE PARTY:  Acct ID - Guarantor Home Phone Work Phone Relationship Acct Type  [id number] Domingo [phone]  Self P/F     [address], [place], [place] [id number]    Palliative care SW outreached patient to complete telephonic visit.    Palliative care SW outreached patient to complete telephonic visit. Patient provided update on medical condition and/or changes.   Patient endorses that she is doing well. Patient shares that she does have some pain in her back which she's had for sometime, has Tylenol if needed.  Continues to be independent with ADL's and ambulate w/o AD.   Hospitalizations: None.  Appetite: patients appetite remains fair - good. No significant weight changes noted.  Sleeping: no concerns. Patient share she is taking something to assist with sleeping, ut unsure the name. she knows it is not melatonin.  No medication needs or adjustments noted. Patient is compliant with medications.    Psychosocial assessment: completed. No other psychosocial needs. No S/S of depression or anxiety noted.   Patient denied the need for an in home visit. Patient is open to another telephonic check in the next 6-8 weeks.    Palliative care will continue to monitor and assist with long term care planning as needed.     SOCIAL HX:  Social History   Tobacco Use   Smoking status: Never   Smokeless tobacco: Never   Tobacco comments:    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 , worked at [employer], [employer]  Substance Use Topics   Alcohol use: No    CODE STATUS: FULL CODE ADVANCED DIRECTIVES: Y MOST FORM COMPLETE:  ongoing discussion HOSPICE EDUCATION PROVIDED: N  Time spent: 20 min      [place], [place]

## 2021-07-13 DIAGNOSIS — H353221 Exudative age-related macular degeneration, left eye, with active choroidal neovascularization: Secondary | ICD-10-CM | POA: Diagnosis not present

## 2021-07-23 ENCOUNTER — Other Ambulatory Visit: Payer: Self-pay | Admitting: Internal Medicine

## 2021-08-23 ENCOUNTER — Encounter: Payer: Self-pay | Admitting: Internal Medicine

## 2021-08-23 ENCOUNTER — Ambulatory Visit (INDEPENDENT_AMBULATORY_CARE_PROVIDER_SITE_OTHER): Payer: Medicare HMO | Admitting: Internal Medicine

## 2021-08-23 VITALS — BP 118/76 | HR 52 | Temp 97.7°F | Ht 63.0 in | Wt 107.2 lb

## 2021-08-23 DIAGNOSIS — B3781 Candidal esophagitis: Secondary | ICD-10-CM

## 2021-08-23 DIAGNOSIS — R1319 Other dysphagia: Secondary | ICD-10-CM

## 2021-08-23 DIAGNOSIS — R634 Abnormal weight loss: Secondary | ICD-10-CM

## 2021-08-23 DIAGNOSIS — M8008XD Age-related osteoporosis with current pathological fracture, vertebra(e), subsequent encounter for fracture with routine healing: Secondary | ICD-10-CM

## 2021-08-23 DIAGNOSIS — K222 Esophageal obstruction: Secondary | ICD-10-CM | POA: Diagnosis not present

## 2021-08-23 DIAGNOSIS — M8008XS Age-related osteoporosis with current pathological fracture, vertebra(e), sequela: Secondary | ICD-10-CM

## 2021-08-23 NOTE — Patient Instructions (Addendum)
Try using apple sauce to take your pills instead of mashed bananas  because the bananas are too sticky and thick   You must take a sip of water after each bite of food to help wash it down so it doesn't get stuck at the narrow place (stricture)    I have made a physical therapy referral to get you fitted for a back brace    For the shoulder  and the back pain   I want you to start taking 2000 mg of acetominophen (tylenol) every day   In divided doses ( 1000 mg every 12 hours.)

## 2021-08-23 NOTE — Progress Notes (Unsigned)
Subjective:  Patient ID: Frances Kent, female    DOB: [birth year]  Age: 88 y.o. MRN: [id number]  CC: There were no encounter diagnoses.   HPI LUS KRIEGEL presents for follow up on multiple issues  Chief Complaint  Patient presents with   Follow-up    1) Dysphagia:   secondary to stricture not amenable to dilation diameter of esophagus 1.3 cm by Jan 2023 EGD [facility])  supposed to be taking protonix bid for life.  Disucssed speech pathology /swallow referral.  No weight loss since  Feb 2023 .  Pills gettign stuck  using mashed bananas has to  tried apple sauce   2)  Back pain:  h/o T11 burst fracture in [place] by [place] in Feb.  Wants back brace.  Reviewed MRI   3) left arm pain :  new.    Outpatient Medications Prior to Visit  Medication Sig Dispense Refill   acetaminophen (TYLENOL) 500 MG tablet Take 500 mg by mouth every 6 (six) hours as needed.     ALPRAZolam (XANAX) 0.25 MG tablet Take 0.5 tablets (0.125 mg total) by mouth 2 (two) times daily as needed for anxiety. 30 tablet 5   amiodarone (PACERONE) 200 MG tablet Take 0.5 tablets (100 mg total) by mouth 2 (two) times daily. Home med.     levalbuterol (XOPENEX) 0.63 MG/3ML nebulizer solution Take 3 mLs (0.63 mg total) by nebulization every 6 (six) hours as needed for wheezing or shortness of breath. 330 mL 0   levothyroxine (EUTHYROX) 50 MCG tablet Take 1 tablet (50 mcg total) by mouth daily at 6 (six) AM. 90 tablet 1   mirtazapine (REMERON) 15 MG tablet Take 1 tablet (15 mg total) by mouth at bedtime. 30 tablet 2   Multiple Vitamin (MULTIVITAMIN WITH MINERALS) TABS tablet Take 1 tablet by mouth daily.     nystatin (MYCOSTATIN) 100000 UNIT/ML suspension Take 5 mLs (500,000 Units total) by mouth 4 (four) times daily. 60 mL 0   senna-docusate (SENOKOT-S) 8.6-50 MG tablet Take 1 tablet by mouth 2 (two) times daily between meals as needed for mild constipation. 60 tablet 0   XARELTO 15 MG TABS tablet TAKE 1 TABLET EVERY  DAY 60 tablet 1   ferrous sulfate 325 (65 FE) MG EC tablet Take 1 tablet (325 mg total) by mouth 2 (two) times daily. 180 tablet 1   furosemide (LASIX) 20 MG tablet Take 2 tablets (40 mg total) by mouth daily. 90 tablet 1   pantoprazole (PROTONIX) 20 MG tablet Take 1 tablet (20 mg total) by mouth 2 (two) times daily before a meal. 180 tablet 0   No facility-administered medications prior to visit.    Review of Systems;  Patient denies headache, fevers, malaise, unintentional weight loss, skin rash, eye pain, sinus congestion and sinus pain, sore throat, dysphagia,  hemoptysis , cough, dyspnea, wheezing, chest pain, palpitations, orthopnea, edema, abdominal pain, nausea, melena, diarrhea, constipation, flank pain, dysuria, hematuria, urinary  Frequency, nocturia, numbness, tingling, seizures,  Focal weakness, Loss of consciousness,  Tremor, insomnia, depression, anxiety, and suicidal ideation.      Objective:  BP 118/76 (BP Location: Left Arm, Patient Position: Sitting, Cuff Size: Small)   Pulse (!) 52   Temp 97.7 F (36.5 C) (Oral)   Ht '5\' 3"'$  (1.6 m)   Wt 107 lb 3.2 oz (48.6 kg)   SpO2 96%   BMI 18.99 kg/m   BP Readings from Last 3 Encounters:  08/23/21 118/76  05/24/21 (!) 144/70  05/03/21 (!) 148/70    Wt Readings from Last 3 Encounters:  08/23/21 107 lb 3.2 oz (48.6 kg)  06/11/21 105 lb (47.6 kg)  05/24/21 105 lb (47.6 kg)    General appearance: alert, cooperative and appears stated age Ears: normal TM's and external ear canals both ears Throat: lips, mucosa, and tongue normal; teeth and gums normal Neck: no adenopathy, no carotid bruit, supple, symmetrical, trachea midline and thyroid not enlarged, symmetric, no tenderness/mass/nodules Back: symmetric, no curvature. ROM normal. No CVA tenderness. Lungs: clear to auscultation bilaterally Heart: regular rate and rhythm, S1, S2 normal, no murmur, click, rub or gallop Abdomen: soft, non-tender; bowel sounds normal; no  masses,  no organomegaly Pulses: 2+ and symmetric Skin: Skin color, texture, turgor normal. No rashes or lesions Lymph nodes: Cervical, supraclavicular, and axillary nodes normal.  Lab Results  Component Value Date   HGBA1C 5.8 (H) 11/02/2020   HGBA1C 6.1 (H) 10/16/2020    Lab Results  Component Value Date   CREATININE 1.06 (H) 03/10/2021   CREATININE 1.06 03/08/2021   CREATININE 1.03 (H) 02/20/2021    Lab Results  Component Value Date   WBC 4.6 05/03/2021   HGB 12.5 05/03/2021   HCT 38.4 05/03/2021   PLT 220.0 05/03/2021   GLUCOSE 106 (H) 03/10/2021   CHOL 144 11/02/2020   TRIG 76 11/02/2020   TRIG 72 11/02/2020   HDL 48 11/02/2020   LDLDIRECT 185.3 03/27/2013   LDLCALC 81 11/02/2020   ALT 25 02/14/2021   AST 27 02/14/2021   NA 140 03/10/2021   K 4.6 03/10/2021   CL 106 03/10/2021   CREATININE 1.06 (H) 03/10/2021   BUN 16 03/10/2021   CO2 24 03/10/2021   TSH 3.09 05/03/2021   INR 3.0 (H) 10/15/2020   HGBA1C 5.8 (H) 11/02/2020    MR THORACIC SPINE WO CONTRAST  Result Date: 03/16/2021 CLINICAL DATA:  Thoracic compression fracture. Mid back pain since fall in December with pain radiating to right leg. EXAM: MRI THORACIC SPINE WITHOUT CONTRAST TECHNIQUE: Multiplanar, multisequence MR imaging of the thoracic spine was performed. No intravenous contrast was administered. COMPARISON:  Thoracic spine radiograph 03/02/2021 FINDINGS: Alignment:  Physiologic. Vertebrae: There is a subacute incomplete burst type fracture of T11 that involves both the anterior and posterior wall and the superior endplate. There is 1-2 mm of retropulsion. Less than 25% height loss. Diffuse bone marrow edema. The other vertebral bodies show mild degenerative change without acute abnormality. There is a sclerotic focus in the T7 vertebral body. Cord:  Normal Paraspinal and other soft tissues: Small right pleural effusion Disc levels: T6-7: Small central disc protrusion without stenosis. T8-9: Small  central disc protrusion without stenosis. T10-11: Small disc bulge without stenosis. The other disc levels are unremarkable. There is no spinal canal or neural foraminal stenosis. IMPRESSION: 1. Subacute incomplete burst type fracture of T11 with 1-2 mm of retropulsion. No associated stenosis. 2. Mild multilevel thoracic degenerative disc disease without spinal canal or neural foraminal stenosis. 3. Small right pleural effusion. Electronically Signed   By: Ulyses Jarred M.D.   On: 03/16/2021 14:56    Assessment & Plan:   Problem List Items Addressed This Visit   None   I spent a total of   minutes with this patient in a face to face visit on the date of this encounter reviewing the last office visit with me on        ,  most recent with patient's cardiologist in    ,  patient'ss diet and eating habits, home blood pressure readings ,  most recent imaging study ,   and post visit ordering of testing and therapeutics.    Follow-up: No follow-ups on file.   [name], MD

## 2021-08-23 NOTE — Assessment & Plan Note (Signed)
Secondary to stricture noted on Jan 2023 EGD.  1.3 cm diameter  Not stretchable.

## 2021-08-24 ENCOUNTER — Telehealth: Payer: Self-pay

## 2021-08-24 DIAGNOSIS — H353212 Exudative age-related macular degeneration, right eye, with inactive choroidal neovascularization: Secondary | ICD-10-CM | POA: Diagnosis not present

## 2021-08-24 NOTE — Assessment & Plan Note (Signed)
Treatment completed with  14 day course of fluconazole,  nystatin suspension.  Caused by use of budesonide for EE

## 2021-08-24 NOTE — Assessment & Plan Note (Signed)
She did not undergo kyphoplasty.  Referring to PT for back brace.

## 2021-08-24 NOTE — Assessment & Plan Note (Signed)
Stabilized.  Secondary to dysphagia

## 2021-08-24 NOTE — Telephone Encounter (Signed)
11:35 AM: Palliative care SW outreached patient for monthly telephonic visit and to schedule in person visit.  Call unsuccessful. SW left HIPPA compliant VM.  Awaiting return call. Will outreach again at later date and continue to offer palliative care support.

## 2021-08-24 NOTE — Assessment & Plan Note (Signed)
With continued dysphagia for pills, food.  Not amenable to stretching per last EGD.  advised to continue lifelong use of  PPI bid.   Speech pathology referral deferred by patient today.  Advised to substitute apple sauce for masehed bananas to facilitiate swallowing and use of sips of water after each bite of food.

## 2021-09-01 ENCOUNTER — Other Ambulatory Visit: Payer: Self-pay | Admitting: Internal Medicine

## 2021-09-09 ENCOUNTER — Ambulatory Visit: Payer: Medicare HMO | Attending: Internal Medicine

## 2021-09-09 DIAGNOSIS — R262 Difficulty in walking, not elsewhere classified: Secondary | ICD-10-CM | POA: Diagnosis not present

## 2021-09-09 DIAGNOSIS — M8008XS Age-related osteoporosis with current pathological fracture, vertebra(e), sequela: Secondary | ICD-10-CM | POA: Diagnosis not present

## 2021-09-09 DIAGNOSIS — M6281 Muscle weakness (generalized): Secondary | ICD-10-CM | POA: Diagnosis not present

## 2021-09-09 DIAGNOSIS — M5459 Other low back pain: Secondary | ICD-10-CM | POA: Diagnosis not present

## 2021-09-09 DIAGNOSIS — M546 Pain in thoracic spine: Secondary | ICD-10-CM | POA: Diagnosis not present

## 2021-09-09 NOTE — Therapy (Signed)
[facility] [address], [place], [address] Phone: [phone]   Fax:  [phone]  Physical Therapy Evaluation  Patient Details  Name: Frances Kent MRN: [id number] Date of Birth: 1931/01/13 Referring Provider (PT): [name], MD   Encounter Date: 09/09/2021   PT End of Session - 09/09/21 1427     Visit Number 1    Number of Visits 17    Date for PT Re-Evaluation 11/04/21    Authorization Type 1    Authorization Time Period 10    PT Start Time 1427   Pt arrived late   PT Stop Time 1505    PT Time Calculation (min) 38 min    Activity Tolerance Patient tolerated treatment well    Behavior During Therapy [facility] for tasks assessed/performed             Past Medical History:  Diagnosis Date   Atrial fibrillation ([place])    Benign breast cyst in female, left 10/07/2016   CAP (community acquired pneumonia) 02/15/2021   Colon adenomas    GERD (gastroesophageal reflux disease)    HCAP (healthcare-associated pneumonia) 02/02/2021   Hypertension    Hypothyroidism    Macular degeneration    Mitral regurgitation    Pulmonary embolism ([place]) 02/2016   SVT (supraventricular tachycardia) ([place])     Past Surgical History:  Procedure Laterality Date   APPENDECTOMY  1960   BREAST CYST ASPIRATION Left 2017   CARDIOVERSION N/A 04/05/2016   Procedure: Cardioversion;  Surgeon: [name], MD;  Location: [facility] ORS;  Service: Cardiovascular;  Laterality: N/A;   CHOLECYSTECTOMY  1985   ESOPHAGOGASTRODUODENOSCOPY N/A 03/10/2021   Procedure: ESOPHAGOGASTRODUODENOSCOPY (EGD);  Surgeon: Toledo, Benay Pike, MD;  Location: [facility] END[facility];  Service: Gastroenterology;  Laterality: N/A;   ESOPHAGOGASTRODUODENOSCOPY (EGD) WITH PROPOFOL N/A 11/27/2019   Procedure: ESOPHAGOGASTRODUODENOSCOPY (EGD) WITH PROPOFOL;  Surgeon: Lesly Rubenstein, MD;  Location: [facility];  Service: Endoscopy;  Laterality: N/A;    ESOPHAGOGASTRODUODENOSCOPY (EGD) WITH PROPOFOL N/A 12/24/2019   Procedure: ESOPHAGOGASTRODUODENOSCOPY (EGD) WITH PROPOFOL;  Surgeon: Lesly Rubenstein, MD;  Location: [facility] END[facility];  Service: Endoscopy;  Laterality: N/A;   IR CT HEAD LTD  11/01/2020   IR PERCUTANEOUS ART THROMBECTOMY/INFUSION INTRACRANIAL INC DIAG ANGIO  11/01/2020   IR US GUIDE VASC ACCESS RIGHT  11/02/2020   OVARIAN CYST REMOVAL     RADIOLOGY WITH ANESTHESIA N/A 11/01/2020   Procedure: RADIOLOGY WITH ANESTHESIA;  Surgeon: Luanne Bras, MD;  Location: [place] [place];  Service: Radiology;  Laterality: N/A;   TEE WITHOUT CARDIOVERSION N/A 02/01/2018   Procedure: TRANSESOPHAGEAL ECHOCARDIOGRAM (TEE);  Surgeon: [name], MD;  Location: [facility];  Service: Cardiovascular;  Laterality: N/A;    There were no vitals filed for this visit.    Subjective Assessment - 09/09/21 1430     Subjective Back: 0/10 currently 9/10 at worst for the past 3 months.    Pertinent History T11 Burst Fracture December 2022.Marland Kitchen Pt fell December 2022 which resulsted in compression fracture. Pt was sick, went to the bathroom and fell onto her rear end. Has a mild stroke Semptember 2022 which took her a while to get over. CVA affected R UE and LE. L lateral arm also bothers her. The vision in R eye less as well. Still has to take it easy when she walks. She has a rw and stopped using. She also fell 2 months ago (May 2022) when getting up from the recliner. She  kept going foward when she got up from the recliner because she could not get the foot rest from the recliner down. Back gets really tired. Had PT after her CVA.   Daughter: Gilberto Better present   Patient Stated Goals Decrease pain    Currently in Pain? No/denies    Pain Type Chronic pain    Aggravating Factors  Back: getting up and walk, standing (such as when folding clothes).    Pain Relieving Factors Tylenol, sitting on a recliner with a pillow behind her back.                [facility] PT  Assessment - 09/09/21 1450       Assessment   Medical Diagnosis M80.08XS (ICD-10-CM) - Fracture of vertebra due to osteoporosis, sequela    Referring Provider (PT) [name], MD    Onset Date/Surgical Date 08/23/21   Date PT referral signed. T11 fracture in 01/2022   Prior Therapy PT after CVA      Precautions   Precaution Comments fall risk, T11 fracture      Home Environment   Additional Comments Pt lives in a first floor set up, does not go down to the basement anymore. Pt lives alone. No steps to enter.      Prior Function   Vocation Retired      Observation/Other Assessments   Focus on Therapeutic Outcomes (FOTO)  Lumbar Spine FOTO 36      Posture/Postural Control   Posture Comments Posture: kyphosis, decreased B hip extension      AROM   Overall AROM Comments Lumbar AROM not performed secondary to fx      Strength   Right Hip Flexion 4-/5    Right Hip Extension 4/5   Seated manually resisted   Right Hip ABduction 4/5   Seated manually resisted   Left Hip Flexion 4/5    Left Hip Extension 4-/5   Seated manually resisted   Left Hip ABduction 4/5   Seated manually resisted   Right Knee Flexion 4/5    Right Knee Extension 5/5    Left Knee Flexion 4/5    Left Knee Extension 5/5      Ambulation/Gait   Gait Comments Gait: antalgic,decreased stance L LE, decrease B hip extension, decreased step length                        Objective measurements completed on examination: See above findings.       .           Recommended prosthetist/orthotist for pt [facility] here in [place]) for custom back brace secondary to not having back braces here. Pt was recommenced to ask her doctor for a prescription for the provider to make a back brace.   Therapeutic exercise  Seated transversus abdominis contraction 5x5 secodns. No pain reported    Improved exercise technique, movement at target joints, use of target muscles after mod verbal,  visual, tactile cues.    Response to treatment Pt tolerated session well without aggravation of symptoms.    Clinical impression Pt is an 88 year old female who came to physical therapy secondary low back pain due to a fall. She also presents with altered gait pattern and posture, decreased activity tolerance, core and bilateral hip weakness, and difficulty performing standing tasks as well as standing up and walking due to pain. Pt will benefit from skilled physical therapy services to address the aforementioned deficits.  Recommended prosthetist/orthotist for pt [facility] here in [place]) for custom back brace secondary to not having back braces here. Pt was recommenced to ask her doctor for a prescription for the provider to make a back brace.        Home Exercise Program  Access Code: [email or web address] URL: [email or web address] Date: 09/09/2021 Prepared by: Joneen Boers  Exercises - Seated Transversus Abdominis Bracing  - 3 x daily - 7 x weekly - 3 sets - 5 reps - 5 seconds hold                    PT Education - 09/09/21 1751     Education Details ther-ex, HEP, POC    Person(s) Educated Patient;Child(ren)    Methods Explanation;Demonstration;Tactile cues;Verbal cues;Handout    Comprehension Verbalized understanding;Returned demonstration              PT Short Term Goals - 09/09/21 1742       PT SHORT TERM GOAL #1   Title Pt will be independent with her initial HEP to decrease pain, improve trunk strength and ability to perform standing tasks more comfortably.    Baseline Pt has started her initial HEP (09/09/2021)    Time 3    Period Weeks    Status New    Target Date 09/30/21               PT Long Term Goals - 09/09/21 1745       PT LONG TERM GOAL #1   Title Pt will have a decrease in back pain to 4/10 or less at worst to promote ability to perform standing tasks as well as standing up from sitting more comfortably.     Baseline 9/10 back pain at worst for the past 3 months (09/09/2021)    Time 8    Period Weeks    Status New    Target Date 11/04/21      PT LONG TERM GOAL #2   Title Pt will improve her FOTO score by at least 10 points as a demonstration of improved function.    Baseline Lumbar spine FOTO 36 (09/09/2021)    Time 8    Period Weeks    Status New    Target Date 11/04/21      PT LONG TERM GOAL #3   Title Pt will improve bilateral hip extension and abduction strength by at least 1/2 MMT grade to promote ability to perform standing tasks more comfortably.    Baseline Seated manually resisted: hip extension 4/5 R, 4-/5 L, hip abduction 4/5 R and L (09/09/2021)    Time 8    Period Weeks    Status New    Target Date 11/04/21                    Plan - 09/09/21 1728     Clinical Impression Statement Pt is an 88 year old female who came to physical therapy secondary low back pain due to a fall. She also presents with altered gait pattern and posture, decreased activity tolerance, core and bilateral hip weakness, and difficulty performing standing tasks as well as standing up and walking due to pain. Pt will benefit from skilled physical therapy services to address the aforementioned deficits.     Recommended prosthetist/orthotist for pt [facility] here in [place]) for custom back brace secondary to not having back braces here. Pt was recommenced to ask her doctor for a prescription for the provider to  make a back brace.    Personal Factors and Comorbidities Age;Comorbidity 3+;Past/Current Experience;Time since onset of injury/illness/exacerbation;Fitness    Comorbidities Atrial fibrillation, HTN, hx of CVA    Examination-Activity Limitations Stand;Lift;Locomotion Level;Transfers    Stability/Clinical Decision Making Stable/Uncomplicated    Clinical Decision Making Low    Rehab Potential Fair    PT Frequency 2x / week    PT Duration 8 weeks    PT Treatment/Interventions  Therapeutic exercise;Therapeutic activities;Aquatic Therapy;Electrical Stimulation;Manual techniques;Dry needling;Gait training;Stair training;Functional mobility training;Balance training;Neuromuscular re-education;Patient/family education    PT Next Visit Plan Posture, trunk and hip strengthening, balance, trunk stability, manual techniques, modalities PRN    PT Home Exercise Plan Medbridge Access Code: [email or web address]    Consulted and Agree with Plan of Care Patient;Family member/caregiver    Family Member Consulted daugther             Patient will benefit from skilled therapeutic intervention in order to improve the following deficits and impairments:  Pain, Postural dysfunction, Improper body mechanics, Decreased strength, Difficulty walking, Decreased balance, Decreased activity tolerance, Abnormal gait  Visit Diagnosis: Other low back pain - Plan: PT plan of care cert/re-cert  Difficulty in walking, not elsewhere classified - Plan: PT plan of care cert/re-cert  Pain in thoracic spine - Plan: PT plan of care cert/re-cert  Muscle weakness (generalized) - Plan: PT plan of care cert/re-cert     Problem List Patient Active Problem List   Diagnosis Date Noted   Candida esophagitis ([place]) 05/03/2021   Vertebral fracture, osteoporotic, sequela 02/23/2021   Severe sepsis ([place]) 02/02/2021   Depression with anxiety 02/02/2021   Iron deficiency anemia 01/25/2021   Symptomatic anemia 01/13/2021   History of embolic stroke 60/11/9321   Inflamed external hemorrhoid 12/08/2020   Insomnia due to anxiety and fear 12/08/2020   Long term (current) use of anticoagulants    Chronic HFrEF (heart failure with reduced ejection fraction) ([place])    Cerebrovascular disease 11/04/2020   Malnutrition of moderate degree ([place]) 11/03/2020   Prolonged QT interval    Persistent atrial fibrillation ([place]) 10/30/2020   ILD (interstitial lung disease) ([place]) 09/22/2020   Atrial flutter, paroxysmal ([place])  09/10/2020   Tachyarrhythmia 09/08/2020   Sick sinus syndrome ([place]) 09/08/2020   Elevated serum free T4 level 09/08/2020   Purpura senilis ([place]) 08/28/2020   Hiatal hernia 08/09/2020   Acquired thrombophilia ([place]) 08/06/2020   Elevated troponin I level 05/21/2020   CVA (cerebral vascular accident) ([place]) 03/24/2020   History of COVID-19 03/03/2020   Bilateral carotid artery stenosis 12/23/2019   Schatzki's ring of distal esophagus 12/11/2019   Erosive gastritis 11/30/2019   Dysphagia 11/27/2019   Painful swallowing 11/27/2019   Gastroesophageal reflux disease 11/27/2019   Stage 3a chronic kidney disease ([place]) 04/11/2019   Essential hypertension 55/73/2202   Chronic systolic CHF (congestive heart failure) ([place]) 08/15/2018   Hypothyroidism due to acquired atrophy of thyroid 05/08/2018   Unintentional weight loss 05/08/2018   Myalgia due to statin 05/08/2018   Abdominal aortic atherosclerosis ([place]) 05/07/2018   Bradycardia 04/28/2018   Pleural effusion on left 02/27/2018   Moderate mitral stenosis 01/10/2018   Lumbar radiculitis 01/07/2018   B12 deficiency 07/26/2017   Atrial fibrillation status post cardioversion [facility]) 04/06/2016   Hospital discharge follow-up 03/10/2016   Vitamin D deficiency 04/08/2015   Cervical spine degeneration 12/13/2014   History of pulmonary embolism 12/02/2014   Insomnia 10/04/2014   Moderate tricuspid insufficiency 09/17/2014   Generalized anxiety disorder 03/28/2013   Mitral valve prolapse  03/28/2013   Routine adult health maintenance 03/23/2012   Fatigue 03/23/2012   Cough 04/17/2011   Screening for breast cancer 11/29/2010   Macular degeneration, left eye 11/29/2010   Screening for colon cancer 11/29/2010   Hyperlipidemia LDL goal <70 11/29/2010   GERD (gastroesophageal reflux disease)     Lavinia Mcneely, PT 09/09/2021, 6:01 PM  [facility] [address] [place],  [address] Phone: [phone]   Fax:  [phone]  Name: FELIX PRATT MRN: [id number] Date of Birth: August 27, 1930

## 2021-09-12 ENCOUNTER — Other Ambulatory Visit: Payer: Self-pay | Admitting: Internal Medicine

## 2021-09-13 ENCOUNTER — Ambulatory Visit: Payer: Medicare HMO

## 2021-09-13 ENCOUNTER — Telehealth: Payer: Self-pay

## 2021-09-13 NOTE — Telephone Encounter (Signed)
No show. Called patient, daughter/caregiver answered the phone. Did not know there was an appointment today. Let her know pt next 2 appointments. Daughter verbalized understanding. Daughter will also call Dr. Derrel Nip about a prescription for a custom back brace for the prosthetist/orthotist for her mother.

## 2021-09-14 ENCOUNTER — Telehealth: Payer: Self-pay

## 2021-09-14 DIAGNOSIS — M8008XS Age-related osteoporosis with current pathological fracture, vertebra(e), sequela: Secondary | ICD-10-CM

## 2021-09-14 NOTE — Telephone Encounter (Signed)
I have pended DME order for approval.

## 2021-09-14 NOTE — Telephone Encounter (Signed)
[name], Gilberto Better, states patient's physical therapist recommends patient get a back brace.  [name] states patient will be starting physical therapy this week.  [name] states she will call the patient's physical therapist to find out where we need to send the prescription.

## 2021-09-14 NOTE — Telephone Encounter (Signed)
Daughter called back to let office know the following information; [facility]: [place], [place], Phone # [phone]. Please send order for back brace.

## 2021-09-16 ENCOUNTER — Ambulatory Visit: Payer: Medicare HMO | Attending: Internal Medicine

## 2021-09-16 DIAGNOSIS — M6281 Muscle weakness (generalized): Secondary | ICD-10-CM | POA: Insufficient documentation

## 2021-09-16 DIAGNOSIS — M546 Pain in thoracic spine: Secondary | ICD-10-CM | POA: Diagnosis not present

## 2021-09-16 DIAGNOSIS — M5459 Other low back pain: Secondary | ICD-10-CM | POA: Diagnosis not present

## 2021-09-16 DIAGNOSIS — R262 Difficulty in walking, not elsewhere classified: Secondary | ICD-10-CM | POA: Insufficient documentation

## 2021-09-16 NOTE — Therapy (Signed)
OUTPATIENT PHYSICAL THERAPY TREATMENT NOTE   Patient Name: Frances Kent MRN: [id number] DOB:[birth year], 88 y.o., female Today's Date: 09/16/2021  PCP: [name], MD REFERRING PROVIDER: [name], MD    PT End of Session - 09/16/21 1547     Visit Number 2    Number of Visits 17    Date for PT Re-Evaluation 11/04/21    Authorization Type 2    Authorization Time Period 10    PT Start Time 1547    PT Stop Time 1611    PT Time Calculation (min) 24 min    Activity Tolerance Patient tolerated treatment well    Behavior During Therapy [facility] for tasks assessed/performed             Past Medical History:  Diagnosis Date   Atrial fibrillation ([place])    Benign breast cyst in female, left 10/07/2016   CAP (community acquired pneumonia) 02/15/2021   Colon adenomas    GERD (gastroesophageal reflux disease)    HCAP (healthcare-associated pneumonia) 02/02/2021   Hypertension    Hypothyroidism    Macular degeneration    Mitral regurgitation    Pulmonary embolism ([place]) 02/2016   SVT (supraventricular tachycardia) ([place])    Past Surgical History:  Procedure Laterality Date   APPENDECTOMY  1960   BREAST CYST ASPIRATION Left 2017   CARDIOVERSION N/A 04/05/2016   Procedure: Cardioversion;  Surgeon: [name], MD;  Location: [facility];  Service: Cardiovascular;  Laterality: N/A;   CHOLECYSTECTOMY  1985   ESOPHAGOGASTRODUODENOSCOPY N/A 03/10/2021   Procedure: ESOPHAGOGASTRODUODENOSCOPY (EGD);  Surgeon: Toledo, Benay Pike, MD;  Location: [facility];  Service: Gastroenterology;  Laterality: N/A;   ESOPHAGOGASTRODUODENOSCOPY (EGD) WITH PROPOFOL N/A 11/27/2019   Procedure: ESOPHAGOGASTRODUODENOSCOPY (EGD) WITH PROPOFOL;  Surgeon: Lesly Rubenstein, MD;  Location: [facility];  Service: Endoscopy;  Laterality: N/A;   ESOPHAGOGASTRODUODENOSCOPY (EGD) WITH PROPOFOL N/A 12/24/2019   Procedure: ESOPHAGOGASTRODUODENOSCOPY (EGD) WITH PROPOFOL;  Surgeon: Lesly Rubenstein, MD;   Location: [facility];  Service: Endoscopy;  Laterality: N/A;   IR CT HEAD LTD  11/01/2020   IR PERCUTANEOUS ART THROMBECTOMY/INFUSION INTRACRANIAL INC DIAG ANGIO  11/01/2020   IR US GUIDE VASC ACCESS RIGHT  11/02/2020   OVARIAN CYST REMOVAL     RADIOLOGY WITH ANESTHESIA N/A 11/01/2020   Procedure: RADIOLOGY WITH ANESTHESIA;  Surgeon: Luanne Bras, MD;  Location: [place];  Service: Radiology;  Laterality: N/A;   TEE WITHOUT CARDIOVERSION N/A 02/01/2018   Procedure: TRANSESOPHAGEAL ECHOCARDIOGRAM (TEE);  Surgeon: [name], MD;  Location: [facility];  Service: Cardiovascular;  Laterality: N/A;   Patient Active Problem List   Diagnosis Date Noted   Candida esophagitis ([place]) 05/03/2021   Vertebral fracture, osteoporotic, sequela 02/23/2021   Severe sepsis ([place]) 02/02/2021   Depression with anxiety 02/02/2021   Iron deficiency anemia 01/25/2021   Symptomatic anemia 01/13/2021   History of embolic stroke 75/64/3329   Inflamed external hemorrhoid 12/08/2020   Insomnia due to anxiety and fear 12/08/2020   Long term (current) use of anticoagulants    Chronic HFrEF (heart failure with reduced ejection fraction) ([place])    Cerebrovascular disease 11/04/2020   Malnutrition of moderate degree ([place]) 11/03/2020   Prolonged QT interval    Persistent atrial fibrillation ([place]) 10/30/2020   ILD (interstitial lung disease) ([place]) 09/22/2020   Atrial flutter, paroxysmal ([place]) 09/10/2020   Tachyarrhythmia 09/08/2020   Sick sinus syndrome ([place]) 09/08/2020   Elevated serum free T4 level 09/08/2020   Purpura senilis ([place]) 08/28/2020  Hiatal hernia 08/09/2020   Acquired thrombophilia ([place]) 08/06/2020   Elevated troponin I level 05/21/2020   CVA (cerebral vascular accident) ([place]) 03/24/2020   History of COVID-19 03/03/2020   Bilateral carotid artery stenosis 12/23/2019   Schatzki's ring of distal esophagus 12/11/2019   Erosive gastritis 11/30/2019   Dysphagia 11/27/2019   Painful swallowing  11/27/2019   Gastroesophageal reflux disease 11/27/2019   Stage 3a chronic kidney disease ([place]) 04/11/2019   Essential hypertension 15/40/0867   Chronic systolic CHF (congestive heart failure) ([place]) 08/15/2018   Hypothyroidism due to acquired atrophy of thyroid 05/08/2018   Unintentional weight loss 05/08/2018   Myalgia due to statin 05/08/2018   Abdominal aortic atherosclerosis ([place]) 05/07/2018   Bradycardia 04/28/2018   Pleural effusion on left 02/27/2018   Moderate mitral stenosis 01/10/2018   Lumbar radiculitis 01/07/2018   B12 deficiency 07/26/2017   Atrial fibrillation status post cardioversion ([place]) 04/06/2016   Hospital discharge follow-up 03/10/2016   Vitamin D deficiency 04/08/2015   Cervical spine degeneration 12/13/2014   History of pulmonary embolism 12/02/2014   Insomnia 10/04/2014   Moderate tricuspid insufficiency 09/17/2014   Generalized anxiety disorder 03/28/2013   Mitral valve prolapse 03/28/2013   Routine adult health maintenance 03/23/2012   Fatigue 03/23/2012   Cough 04/17/2011   Screening for breast cancer 11/29/2010   Macular degeneration, left eye 11/29/2010   Screening for colon cancer 11/29/2010   Hyperlipidemia LDL goal <70 11/29/2010   GERD (gastroesophageal reflux disease)     REFERRING DIAG: M80.08XS (ICD-10-CM) - Fracture of vertebra due to osteoporosis, sequela  THERAPY DIAG:  Other low back pain  Difficulty in walking, not elsewhere classified  Pain in thoracic spine  Muscle weakness (generalized)  Rationale for Evaluation and Treatment Rehabilitation  PERTINENT HISTORY: T11 Burst Fracture December 2022.Marland Kitchen Pt fell December 2022 which resulsted in compression fracture. Pt was sick, went to the bathroom and fell onto her rear end. Has a mild stroke Semptember 2022 which took her a while to get over. CVA affected R UE and LE. L lateral arm also bothers her. The vision in R eye less as well. Still has to take it easy when she walks. She has  a rw and stopped using. She also fell 2 months ago (May 2022) when getting up from the recliner. She kept going foward when she got up from the recliner because she could not get the foot rest from the recliner down. Back gets really tired. Had PT after her CVA.  PRECAUTIONS: fall risk, T11 fracture  SUBJECTIVE: Back is about the same. No pain but feels uncomfortable when she sits. Pt states getting tired easily.   PAIN:  Are you having pain? 0/10 currently, just uncomfortable     TODAY'S TREATMENT:    Therapeutic exercise   Seated B scapular retraction 10x5 seconds for 3 sets  Seated transversus abdominis contraction 10x5 seconds for 2 sets   Seated glute max squeeze 10x5 seconds   Seated B ankle DF/PF 10x         Improved exercise technique, movement at target joints, use of target muscles after mod verbal, visual, tactile cues.      Response to treatment Pt tolerated session well without aggravation of symptoms.      Clinical impression Lighter session today secondary to deconditioning and weakness observed. Therapeutic rest breaks needed.  Worked on gentle trunk strengthening and thoracic extension to help decreased stress to lower back. Pt tolerated session well without aggravation of symptoms. Pt will benefit from  continued skilled physical therapy services to improve strength, activity tolerance, trunk stability, and function.     PATIENT EDUCATION: Education details: there-ex. HEP Person educated: Patient Education method: Explanation, Demonstration, Tactile cues, Verbal cues, and Handouts Education comprehension: verbalized understanding and returned demonstration   HOME EXERCISE PROGRAM: Home Exercise Program   Access Code: [email or web address] URL: [email or web address] Date: 09/09/2021 Prepared by: Joneen Boers   Exercises - Seated Transversus Abdominis Bracing  - 3 x daily - 7 x weekly - 3 sets - 5 reps - 5 seconds hold - Seated Scapular  Retraction  - 1 x daily - 7 x weekly - 2 - 3 sets - 10 reps - 5 seconds hold     PT Short Term Goals - 09/09/21 1742       PT SHORT TERM GOAL #1   Title Pt will be independent with her initial HEP to decrease pain, improve trunk strength and ability to perform standing tasks more comfortably.    Baseline Pt has started her initial HEP (09/09/2021)    Time 3    Period Weeks    Status New    Target Date 09/30/21              PT Long Term Goals - 09/09/21 1745       PT LONG TERM GOAL #1   Title Pt will have a decrease in back pain to 4/10 or less at worst to promote ability to perform standing tasks as well as standing up from sitting more comfortably.    Baseline 9/10 back pain at worst for the past 3 months (09/09/2021)    Time 8    Period Weeks    Status New    Target Date 11/04/21      PT LONG TERM GOAL #2   Title Pt will improve her FOTO score by at least 10 points as a demonstration of improved function.    Baseline Lumbar spine FOTO 36 (09/09/2021)    Time 8    Period Weeks    Status New    Target Date 11/04/21      PT LONG TERM GOAL #3   Title Pt will improve bilateral hip extension and abduction strength by at least 1/2 MMT grade to promote ability to perform standing tasks more comfortably.    Baseline Seated manually resisted: hip extension 4/5 R, 4-/5 L, hip abduction 4/5 R and L (09/09/2021)    Time 8    Period Weeks    Status New    Target Date 11/04/21              Plan - 09/16/21 1605     Clinical Impression Statement Lighter session today secondary to deconditioning and weakness observed. Therapeutic rest breaks needed.  Worked on gentle trunk strengthening and thoracic extension to help decreased stress to lower back. Pt tolerated session well without aggravation of symptoms. Pt will benefit from continued skilled physical therapy services to improve strength, activity tolerance, trunk stability, and function.    Personal Factors and Comorbidities  Age;Comorbidity 3+;Past/Current Experience;Time since onset of injury/illness/exacerbation;Fitness    Comorbidities Atrial fibrillation, HTN, hx of CVA    Examination-Activity Limitations Stand;Lift;Locomotion Level;Transfers    Stability/Clinical Decision Making Stable/Uncomplicated    Rehab Potential Fair    PT Frequency 2x / week    PT Duration 8 weeks    PT Treatment/Interventions Therapeutic exercise;Therapeutic activities;Aquatic Therapy;Electrical Stimulation;Manual techniques;Dry needling;Gait training;Stair training;Functional mobility training;Balance training;Neuromuscular re-education;Patient/family education    PT Next Visit Plan  Posture, trunk and hip strengthening, balance, trunk stability, manual techniques, modalities PRN    PT Home Exercise Plan Medbridge Access Code: [email or web address]    Consulted and Agree with Plan of Care Patient;Family member/caregiver    Family Member Consulted daugther              [place] PT, DPT  09/16/2021, 4:17 PM

## 2021-09-27 ENCOUNTER — Other Ambulatory Visit: Payer: Self-pay

## 2021-09-27 NOTE — Patient Outreach (Signed)
[facility]) Care Management  09/27/2021  Frances Kent August 20, 1930 [id number]   Telephone Screen    Outreach call to patient to introduce [facility] services and assess care needs as part of benefit of PCP office and insurance plan. Spoke with patient. No RN CM needs or concerns at this time.   Main healthcare issue/concern today: Patient states she fell in Jan. Since then she has been having ongoing back issues and pain. She recently started outpatient therapy. She has completed two sessions. She states she can't tell if it is helping or working just yet. Patient taking Tylenol prn. She is active with Palliative Care services.    Health Maintenance/Care Gaps Addressed & Education Provided:   -Last AWV: Completed on 06/11/21-showing in EMR and [facility].     Enzo Montgomery, RN,BSN,CCM [place] Management Telephonic Care Management Coordinator Direct Phone: [phone] Toll Free: [phone] Fax: [phone]

## 2021-09-28 ENCOUNTER — Ambulatory Visit: Payer: Medicare HMO

## 2021-09-28 DIAGNOSIS — M546 Pain in thoracic spine: Secondary | ICD-10-CM

## 2021-09-28 DIAGNOSIS — R262 Difficulty in walking, not elsewhere classified: Secondary | ICD-10-CM

## 2021-09-28 DIAGNOSIS — M5459 Other low back pain: Secondary | ICD-10-CM | POA: Diagnosis not present

## 2021-09-28 DIAGNOSIS — M6281 Muscle weakness (generalized): Secondary | ICD-10-CM | POA: Diagnosis not present

## 2021-09-28 NOTE — Therapy (Signed)
OUTPATIENT PHYSICAL THERAPY TREATMENT NOTE   Patient Name: Frances Kent MRN: [id number] DOB:1930-04-23, 88 y.o., female Today's Date: 09/28/2021  PCP: [name], MD REFERRING PROVIDER: [name], MD    PT End of Session - 09/28/21 1055     Visit Number 3    Number of Visits 17    Date for PT Re-Evaluation 11/04/21    Authorization Type 3    Authorization Time Period 10    PT Start Time 1100    PT Stop Time 1147    PT Time Calculation (min) 47 min    Activity Tolerance Patient limited by fatigue;No increased pain    Behavior During Therapy [facility] for tasks assessed/performed             Past Medical History:  Diagnosis Date   Atrial fibrillation ([place])    Benign breast cyst in female, left 10/07/2016   CAP (community acquired pneumonia) 02/15/2021   Colon adenomas    GERD (gastroesophageal reflux disease)    HCAP (healthcare-associated pneumonia) 02/02/2021   Hypertension    Hypothyroidism    Macular degeneration    Mitral regurgitation    Pulmonary embolism ([place]) 02/2016   SVT (supraventricular tachycardia) ([facility])    Past Surgical History:  Procedure Laterality Date   APPENDECTOMY  1960   BREAST CYST ASPIRATION Left 2017   CARDIOVERSION N/A 04/05/2016   Procedure: Cardioversion;  Surgeon: [name], MD;  Location: [facility];  Service: Cardiovascular;  Laterality: N/A;   CHOLECYSTECTOMY  1985   ESOPHAGOGASTRODUODENOSCOPY N/A 03/10/2021   Procedure: ESOPHAGOGASTRODUODENOSCOPY (EGD);  Surgeon: Toledo, Benay Pike, MD;  Location: [facility];  Service: Gastroenterology;  Laterality: N/A;   ESOPHAGOGASTRODUODENOSCOPY (EGD) WITH PROPOFOL N/A 11/27/2019   Procedure: ESOPHAGOGASTRODUODENOSCOPY (EGD) WITH PROPOFOL;  Surgeon: Lesly Rubenstein, MD;  Location: [facility];  Service: Endoscopy;  Laterality: N/A;   ESOPHAGOGASTRODUODENOSCOPY (EGD) WITH PROPOFOL N/A 12/24/2019   Procedure: ESOPHAGOGASTRODUODENOSCOPY (EGD) WITH PROPOFOL;  Surgeon: Lesly Rubenstein, MD;  Location: [facility];  Service: Endoscopy;  Laterality: N/A;   IR CT HEAD LTD  11/01/2020   IR PERCUTANEOUS ART THROMBECTOMY/INFUSION INTRACRANIAL INC DIAG ANGIO  11/01/2020   IR US GUIDE VASC ACCESS RIGHT  11/02/2020   OVARIAN CYST REMOVAL     RADIOLOGY WITH ANESTHESIA N/A 11/01/2020   Procedure: RADIOLOGY WITH ANESTHESIA;  Surgeon: Luanne Bras, MD;  Location: [place];  Service: Radiology;  Laterality: N/A;   TEE WITHOUT CARDIOVERSION N/A 02/01/2018   Procedure: TRANSESOPHAGEAL ECHOCARDIOGRAM (TEE);  Surgeon: [name], MD;  Location: [facility];  Service: Cardiovascular;  Laterality: N/A;   Patient Active Problem List   Diagnosis Date Noted   Candida esophagitis ([place]) 05/03/2021   Vertebral fracture, osteoporotic, sequela 02/23/2021   Severe sepsis ([place]) 02/02/2021   Depression with anxiety 02/02/2021   Iron deficiency anemia 01/25/2021   Symptomatic anemia 01/13/2021   History of embolic stroke 03/47/4259   Inflamed external hemorrhoid 12/08/2020   Insomnia due to anxiety and fear 12/08/2020   Long term (current) use of anticoagulants    Chronic HFrEF (heart failure with reduced ejection fraction) ([place])    Cerebrovascular disease 11/04/2020   Malnutrition of moderate degree ([place]) 11/03/2020   Prolonged QT interval    Persistent atrial fibrillation ([place]) 10/30/2020   ILD (interstitial lung disease) ([place]) 09/22/2020   Atrial flutter, paroxysmal ([place]) 09/10/2020   Tachyarrhythmia 09/08/2020   Sick sinus syndrome ([place]) 09/08/2020   Elevated serum free T4 level 09/08/2020   Purpura senilis ([place])  08/28/2020   Hiatal hernia 08/09/2020   Acquired thrombophilia ([place]) 08/06/2020   Elevated troponin I level 05/21/2020   CVA (cerebral vascular accident) ([place]) 03/24/2020   History of COVID-19 03/03/2020   Bilateral carotid artery stenosis 12/23/2019   Schatzki's ring of distal esophagus 12/11/2019   Erosive gastritis 11/30/2019   Dysphagia 11/27/2019    Painful swallowing 11/27/2019   Gastroesophageal reflux disease 11/27/2019   Stage 3a chronic kidney disease ([place]) 04/11/2019   Essential hypertension 84/16/6063   Chronic systolic CHF (congestive heart failure) ([place]) 08/15/2018   Hypothyroidism due to acquired atrophy of thyroid 05/08/2018   Unintentional weight loss 05/08/2018   Myalgia due to statin 05/08/2018   Abdominal aortic atherosclerosis ([place]) 05/07/2018   Bradycardia 04/28/2018   Pleural effusion on left 02/27/2018   Moderate mitral stenosis 01/10/2018   Lumbar radiculitis 01/07/2018   B12 deficiency 07/26/2017   Atrial fibrillation status post cardioversion ([place]) 04/06/2016   Hospital discharge follow-up 03/10/2016   Vitamin D deficiency 04/08/2015   Cervical spine degeneration 12/13/2014   History of pulmonary embolism 12/02/2014   Insomnia 10/04/2014   Moderate tricuspid insufficiency 09/17/2014   Generalized anxiety disorder 03/28/2013   Mitral valve prolapse 03/28/2013   Routine adult health maintenance 03/23/2012   Fatigue 03/23/2012   Cough 04/17/2011   Screening for breast cancer 11/29/2010   Macular degeneration, left eye 11/29/2010   Screening for colon cancer 11/29/2010   Hyperlipidemia LDL goal <70 11/29/2010   GERD (gastroesophageal reflux disease)     REFERRING DIAG: M80.08XS (ICD-10-CM) - Fracture of vertebra due to osteoporosis, sequela  THERAPY DIAG:  Other low back pain  Difficulty in walking, not elsewhere classified  Pain in thoracic spine  Muscle weakness (generalized)  Rationale for Evaluation and Treatment Rehabilitation  PERTINENT HISTORY: T11 Burst Fracture December 2022.Marland Kitchen Pt fell December 2022 which resulsted in compression fracture. Pt was sick, went to the bathroom and fell onto her rear end. Has a mild stroke Semptember 2022 which took her a while to get over. CVA affected R UE and LE. L lateral arm also bothers her. The vision in R eye less as well. Still has to take it easy when  she walks. She has a rw and stopped using. She also fell 2 months ago (May 2022) when getting up from the recliner. She kept going foward when she got up from the recliner because she could not get the foot rest from the recliner down. Back gets really tired. Had PT after her CVA.  PRECAUTIONS: fall risk, T11 fracture  SUBJECTIVE: Pt and daughter reporting pt remains highly fatigued. Reports unsure if polypharmacy could be a cause. Pain is ok until she is standing and walking. Pt states PCP doesn't want her using back brace to prevent muscle atrophy.   PAIN:  Are you having pain? 0/10 currently, just uncomfortable     TODAY'S TREATMENT:    Therapeutic exercise 09/28/21:   Vitals seated:   BP: 173/51   HR: 53 BPM   Vitals seated again 2 minutes later:   BP: 166/49 mm Hg  HR: 52 BPM    Education provided on elevated BP currently. Educated pt's daughter and pt to continue monitoring BP since it is elevated. Education provided on sleeping postures due to reports of lack of sleep lately to try and improve quality of sleep and reduced fatigue levels to improve tolerance for PT. Tips and tricks provided on sleep hygiene (I.e. limiting screen time and blue light before sleep).  Seated thoracic extension over 1/2 bolster: 3x8   Seated B scapular retraction 10x5 seconds for 3 sets with RTB. Min VC's for form/technique.   Seated overhead ball raises to promote spine extension: 3x12  Standing hip extension step backs with BUE support. 2x12/LE. Min VC's for form. Decreased step length on RLE with left lateral lean. Requiring min to mod TC's on shoulder and pelvis for form/technique.     Therapeutic seated rest breaks throughout session   Vitals post session:   BP: 168/56 mm Hg  HR: 53 BPM       Improved exercise technique, movement at target joints, use of target muscles after mod verbal, visual, tactile cues.      Response to treatment Pt tolerated session well without  aggravation of symptoms. Limited in exertional activity due to BP.     Clinical impression Pt remains limited today due to fatigue and elevated BP readings. Focus of session on education and seated mobility and postural strengthening with therapeutic rest breaks. Educated pt and daughter on monitoring BP due to being elevated and education on improving quality of sleep to reduce fatigue. Pt will continue to benefit from skilled PT services to improve strength, activity tolerance, and stability to improve overall functional mobility.     PATIENT EDUCATION: Education details: there-ex, monitoring BP, sleep hygiene. Person educated: Patient Education method: Explanation, Demonstration, Tactile cues, Verbal cues, and Handouts Education comprehension: verbalized understanding and returned demonstration   HOME EXERCISE PROGRAM: Home Exercise Program   Access Code: [email or web address] URL: [email or web address] Date: 09/09/2021 Prepared by: Joneen Boers   Exercises - Seated Transversus Abdominis Bracing  - 3 x daily - 7 x weekly - 3 sets - 5 reps - 5 seconds hold - Seated Scapular Retraction  - 1 x daily - 7 x weekly - 2 - 3 sets - 10 reps - 5 seconds hold     PT Short Term Goals - 09/09/21 1742       PT SHORT TERM GOAL #1   Title Pt will be independent with her initial HEP to decrease pain, improve trunk strength and ability to perform standing tasks more comfortably.    Baseline Pt has started her initial HEP (09/09/2021)    Time 3    Period Weeks    Status New    Target Date 09/30/21              PT Long Term Goals - 09/09/21 1745       PT LONG TERM GOAL #1   Title Pt will have a decrease in back pain to 4/10 or less at worst to promote ability to perform standing tasks as well as standing up from sitting more comfortably.    Baseline 9/10 back pain at worst for the past 3 months (09/09/2021)    Time 8    Period Weeks    Status New    Target Date 11/04/21      PT  LONG TERM GOAL #2   Title Pt will improve her FOTO score by at least 10 points as a demonstration of improved function.    Baseline Lumbar spine FOTO 36 (09/09/2021)    Time 8    Period Weeks    Status New    Target Date 11/04/21      PT LONG TERM GOAL #3   Title Pt will improve bilateral hip extension and abduction strength by at least 1/2 MMT grade to promote ability to perform standing tasks more comfortably.  Baseline Seated manually resisted: hip extension 4/5 R, 4-/5 L, hip abduction 4/5 R and L (09/09/2021)    Time 8    Period Weeks    Status New    Target Date 11/04/21                  [place] Caster. Fairly IV, PT, DPT Physical Therapist- [facility]  09/28/2021, 11:57 AM

## 2021-10-04 ENCOUNTER — Ambulatory Visit: Payer: Medicare HMO

## 2021-10-04 ENCOUNTER — Other Ambulatory Visit: Payer: Self-pay | Admitting: Family

## 2021-10-04 DIAGNOSIS — M6281 Muscle weakness (generalized): Secondary | ICD-10-CM

## 2021-10-04 DIAGNOSIS — M546 Pain in thoracic spine: Secondary | ICD-10-CM | POA: Diagnosis not present

## 2021-10-04 DIAGNOSIS — M5459 Other low back pain: Secondary | ICD-10-CM | POA: Diagnosis not present

## 2021-10-04 DIAGNOSIS — R262 Difficulty in walking, not elsewhere classified: Secondary | ICD-10-CM | POA: Diagnosis not present

## 2021-10-04 NOTE — Therapy (Signed)
OUTPATIENT PHYSICAL THERAPY TREATMENT NOTE   Patient Name: Frances Kent MRN: [id number] DOB:[birth year], 88 y.o., female Today's Date: 10/04/2021  PCP: [name], MD REFERRING PROVIDER: [name], MD    PT End of Session - 10/04/21 1636     Visit Number 4    Number of Visits 17    Date for PT Re-Evaluation 11/04/21    Authorization Type 4    Authorization Time Period 10    PT Start Time 1636    PT Stop Time 1707    PT Time Calculation (min) 31 min    Activity Tolerance Patient limited by fatigue;No increased pain    Behavior During Therapy [facility] for tasks assessed/performed              Past Medical History:  Diagnosis Date   Atrial fibrillation ([place])    Benign breast cyst in female, left 10/07/2016   CAP (community acquired pneumonia) 02/15/2021   Colon adenomas    GERD (gastroesophageal reflux disease)    HCAP (healthcare-associated pneumonia) 02/02/2021   Hypertension    Hypothyroidism    Macular degeneration    Mitral regurgitation    Pulmonary embolism ([place]) 02/2016   SVT (supraventricular tachycardia) (HCC)    Past Surgical History:  Procedure Laterality Date   APPENDECTOMY  1960   BREAST CYST ASPIRATION Left 2017   CARDIOVERSION N/A 04/05/2016   Procedure: Cardioversion;  Surgeon: [name], MD;  Location: [facility];  Service: Cardiovascular;  Laterality: N/A;   CHOLECYSTECTOMY  1985   ESOPHAGOGASTRODUODENOSCOPY N/A 03/10/2021   Procedure: ESOPHAGOGASTRODUODENOSCOPY (EGD);  Surgeon: Toledo, Benay Pike, MD;  Location: [facility];  Service: Gastroenterology;  Laterality: N/A;   ESOPHAGOGASTRODUODENOSCOPY (EGD) WITH PROPOFOL N/A 11/27/2019   Procedure: ESOPHAGOGASTRODUODENOSCOPY (EGD) WITH PROPOFOL;  Surgeon: Lesly Rubenstein, MD;  Location: [facility];  Service: Endoscopy;  Laterality: N/A;   ESOPHAGOGASTRODUODENOSCOPY (EGD) WITH PROPOFOL N/A 12/24/2019   Procedure: ESOPHAGOGASTRODUODENOSCOPY (EGD) WITH PROPOFOL;  Surgeon: Lesly Rubenstein, MD;  Location: [facility];  Service: Endoscopy;  Laterality: N/A;   IR CT HEAD LTD  11/01/2020   IR PERCUTANEOUS ART THROMBECTOMY/INFUSION INTRACRANIAL INC DIAG ANGIO  11/01/2020   IR US GUIDE VASC ACCESS RIGHT  11/02/2020   OVARIAN CYST REMOVAL     RADIOLOGY WITH ANESTHESIA N/A 11/01/2020   Procedure: RADIOLOGY WITH ANESTHESIA;  Surgeon: Luanne Bras, MD;  Location: [place];  Service: Radiology;  Laterality: N/A;   TEE WITHOUT CARDIOVERSION N/A 02/01/2018   Procedure: TRANSESOPHAGEAL ECHOCARDIOGRAM (TEE);  Surgeon: [name], MD;  Location: [facility];  Service: Cardiovascular;  Laterality: N/A;   Patient Active Problem List   Diagnosis Date Noted   Candida esophagitis ([place]) 05/03/2021   Vertebral fracture, osteoporotic, sequela 02/23/2021   Severe sepsis ([place]) 02/02/2021   Depression with anxiety 02/02/2021   Iron deficiency anemia 01/25/2021   Symptomatic anemia 01/13/2021   History of embolic stroke 61/60/7371   Inflamed external hemorrhoid 12/08/2020   Insomnia due to anxiety and fear 12/08/2020   Long term (current) use of anticoagulants    Chronic HFrEF (heart failure with reduced ejection fraction) ([place])    Cerebrovascular disease 11/04/2020   Malnutrition of moderate degree ([place]) 11/03/2020   Prolonged QT interval    Persistent atrial fibrillation ([place]) 10/30/2020   ILD (interstitial lung disease) ([place]) 09/22/2020   Atrial flutter, paroxysmal ([place]) 09/10/2020   Tachyarrhythmia 09/08/2020   Sick sinus syndrome ([place]) 09/08/2020   Elevated serum free T4 level 09/08/2020   Purpura senilis (  [place]) 08/28/2020   Hiatal hernia 08/09/2020   Acquired thrombophilia ([place]) 08/06/2020   Elevated troponin I level 05/21/2020   CVA (cerebral vascular accident) ([place]) 03/24/2020   History of COVID-19 03/03/2020   Bilateral carotid artery stenosis 12/23/2019   Schatzki's ring of distal esophagus 12/11/2019   Erosive gastritis 11/30/2019   Dysphagia 11/27/2019    Painful swallowing 11/27/2019   Gastroesophageal reflux disease 11/27/2019   Stage 3a chronic kidney disease ([place]) 04/11/2019   Essential hypertension 16/11/9602   Chronic systolic CHF (congestive heart failure) ([place]) 08/15/2018   Hypothyroidism due to acquired atrophy of thyroid 05/08/2018   Unintentional weight loss 05/08/2018   Myalgia due to statin 05/08/2018   Abdominal aortic atherosclerosis ([place]) 05/07/2018   Bradycardia 04/28/2018   Pleural effusion on left 02/27/2018   Moderate mitral stenosis 01/10/2018   Lumbar radiculitis 01/07/2018   B12 deficiency 07/26/2017   Atrial fibrillation status post cardioversion ([place]) 04/06/2016   Hospital discharge follow-up 03/10/2016   Vitamin D deficiency 04/08/2015   Cervical spine degeneration 12/13/2014   History of pulmonary embolism 12/02/2014   Insomnia 10/04/2014   Moderate tricuspid insufficiency 09/17/2014   Generalized anxiety disorder 03/28/2013   Mitral valve prolapse 03/28/2013   Routine adult health maintenance 03/23/2012   Fatigue 03/23/2012   Cough 04/17/2011   Screening for breast cancer 11/29/2010   Macular degeneration, left eye 11/29/2010   Screening for colon cancer 11/29/2010   Hyperlipidemia LDL goal <70 11/29/2010   GERD (gastroesophageal reflux disease)     REFERRING DIAG: M80.08XS (ICD-10-CM) - Fracture of vertebra due to osteoporosis, sequela  THERAPY DIAG:  Other low back pain  Difficulty in walking, not elsewhere classified  Pain in thoracic spine  Muscle weakness (generalized)  Rationale for Evaluation and Treatment Rehabilitation  PERTINENT HISTORY: T11 Burst Fracture December 2022.Marland Kitchen Pt fell December 2022 which resulsted in compression fracture. Pt was sick, went to the bathroom and fell onto her rear end. Has a mild stroke Semptember 2022 which took her a while to get over. CVA affected R UE and LE. L lateral arm also bothers her. The vision in R eye less as well. Still has to take it easy when  she walks. She has a rw and stopped using. She also fell 2 months ago (May 2022) when getting up from the recliner. She kept going foward when she got up from the recliner because she could not get the foot rest from the recliner down. Back gets really tired. Had PT after her CVA.  PRECAUTIONS: fall risk, T11 fracture  SUBJECTIVE: Pt states that she stays tired. Does not rest well at night. Goes to bed around 11 pm to 8 am. Wakes up at times in the middle of the night. No back pain currently.    PAIN:  Are you having pain? 0/10 currently    TODAY'S TREATMENT:    Therapeutic exercise 10/04/21:   Vitals seated:   BP: 166/57  HR: 52 BPM    Seated B scapular retraction 10x5 seconds   Then with red band 10x5 seconds   Seated transversus abdominis contraction 10x5 seconds for 2 sets   Vitals seated:   BP: 168/70  HR: 43 BPM   SpO2   100% room air;  HR 44-54    Session stopped secondary to history of A-fib, age, and HR fluctuating between 44 to 38  Sees her heart doctor this Wednesday. Pt and daughter were recommended to ask her MD about her a-fib and exercise. Pt and daughter/caregiver  verbalized understanding.       Therapeutic seated rest breaks throughout session      Improved exercise technique, movement at target joints, use of target muscles after mod verbal, visual, tactile cues.      Response to treatment Pt tolerated session well without aggravation of back pain. Limited in exertional activity due to vitals     Clinical impression Therapeutic rest breaks needed.  Worked on gentle trunk strengthening and thoracic extension to help decreased stress to lower back. Lighter session today secondary to deconditioning and weakness observed. Session stopped early secondary to history of A-fib, age, and HR fluctuating between 42 to 54 bpm. Pt states seeing her heart doctor this Wednesday. Pt and daughter were recommended to ask her MD about her a-fib and exercise. Pt and  daughter verbalized understanding. Pt tolerated session well without aggravation of back pain. Pt will benefit from continued skilled physical therapy services to improve strength, activity tolerance, trunk stability, and function.       PATIENT EDUCATION: Education details: there-ex, monitoring BP, sleep hygiene. Person educated: Patient Education method: Explanation, Demonstration, Tactile cues, Verbal cues, and Handouts Education comprehension: verbalized understanding and returned demonstration   HOME EXERCISE PROGRAM: Home Exercise Program   Access Code: [email or web address] URL: [email or web address] Date: 09/09/2021 Prepared by: Joneen Boers   Exercises - Seated Transversus Abdominis Bracing  - 3 x daily - 7 x weekly - 3 sets - 5 reps - 5 seconds hold - Seated Scapular Retraction  - 1 x daily - 7 x weekly - 2 - 3 sets - 10 reps - 5 seconds hold     PT Short Term Goals - 09/09/21 1742       PT SHORT TERM GOAL #1   Title Pt will be independent with her initial HEP to decrease pain, improve trunk strength and ability to perform standing tasks more comfortably.    Baseline Pt has started her initial HEP (09/09/2021)    Time 3    Period Weeks    Status New    Target Date 09/30/21              PT Long Term Goals - 09/09/21 1745       PT LONG TERM GOAL #1   Title Pt will have a decrease in back pain to 4/10 or less at worst to promote ability to perform standing tasks as well as standing up from sitting more comfortably.    Baseline 9/10 back pain at worst for the past 3 months (09/09/2021)    Time 8    Period Weeks    Status New    Target Date 11/04/21      PT LONG TERM GOAL #2   Title Pt will improve her FOTO score by at least 10 points as a demonstration of improved function.    Baseline Lumbar spine FOTO 36 (09/09/2021)    Time 8    Period Weeks    Status New    Target Date 11/04/21      PT LONG TERM GOAL #3   Title Pt will improve bilateral hip  extension and abduction strength by at least 1/2 MMT grade to promote ability to perform standing tasks more comfortably.    Baseline Seated manually resisted: hip extension 4/5 R, 4-/5 L, hip abduction 4/5 R and L (09/09/2021)    Time 8    Period Weeks    Status New    Target Date 11/04/21  Plan - 10/04/21 1639     Clinical Impression Statement Therapeutic rest breaks needed.  Worked on gentle trunk strengthening and thoracic extension to help decreased stress to lower back. Lighter session today secondary to deconditioning and weakness observed. Session stopped early secondary to history of A-fib, age, and HR fluctuating between 62 to 54 bpm. Pt states seeing her heart doctor this Wednesday. Pt and daughter were recommended to ask her MD about her a-fib and exercise. Pt and daughter verbalized understanding. Pt tolerated session well without aggravation of back pain. Pt will benefit from continued skilled physical therapy services to improve strength, activity tolerance, trunk stability, and function.    Personal Factors and Comorbidities Age;Comorbidity 3+;Past/Current Experience;Time since onset of injury/illness/exacerbation;Fitness    Comorbidities Atrial fibrillation, HTN, hx of CVA    Examination-Activity Limitations Stand;Lift;Locomotion Level;Transfers    Stability/Clinical Decision Making Stable/Uncomplicated    Rehab Potential Fair    PT Frequency 2x / week    PT Duration 8 weeks    PT Treatment/Interventions Therapeutic exercise;Therapeutic activities;Aquatic Therapy;Electrical Stimulation;Manual techniques;Dry needling;Gait training;Stair training;Functional mobility training;Balance training;Neuromuscular re-education;Patient/family education    PT Next Visit Plan Posture, trunk and hip strengthening, balance, trunk stability, manual techniques, modalities PRN    PT Home Exercise Plan Medbridge Access Code: [email or web address]    Consulted and Agree with Plan of Care  Patient;Family member/caregiver    Family Member Consulted daugther              [place] PT, DPT  10/04/2021, 6:36 PM

## 2021-10-06 DIAGNOSIS — I480 Paroxysmal atrial fibrillation: Secondary | ICD-10-CM | POA: Diagnosis not present

## 2021-10-06 DIAGNOSIS — I10 Essential (primary) hypertension: Secondary | ICD-10-CM | POA: Diagnosis not present

## 2021-10-06 DIAGNOSIS — R001 Bradycardia, unspecified: Secondary | ICD-10-CM | POA: Diagnosis not present

## 2021-10-06 DIAGNOSIS — I341 Nonrheumatic mitral (valve) prolapse: Secondary | ICD-10-CM | POA: Diagnosis not present

## 2021-10-12 ENCOUNTER — Ambulatory Visit: Payer: Medicare HMO

## 2021-10-12 DIAGNOSIS — M5459 Other low back pain: Secondary | ICD-10-CM | POA: Diagnosis not present

## 2021-10-12 DIAGNOSIS — M6281 Muscle weakness (generalized): Secondary | ICD-10-CM

## 2021-10-12 DIAGNOSIS — M546 Pain in thoracic spine: Secondary | ICD-10-CM | POA: Diagnosis not present

## 2021-10-12 DIAGNOSIS — R262 Difficulty in walking, not elsewhere classified: Secondary | ICD-10-CM | POA: Diagnosis not present

## 2021-10-12 NOTE — Therapy (Signed)
OUTPATIENT PHYSICAL THERAPY TREATMENT NOTE   Patient Name: Frances Kent MRN: [id number] DOB:[birth year], 88 y.o., female Today's Date: 10/12/2021  PCP: [name], MD REFERRING PROVIDER: [name], MD    PT End of Session - 10/12/21 [phone]     Visit Number 5    Number of Visits 17    Date for PT Re-Evaluation 11/04/21    Authorization Type 5    Authorization Time Period 10    PT Start Time 0853    PT Stop Time 0926    PT Time Calculation (min) 33 min    Activity Tolerance Patient limited by fatigue;No increased pain    Behavior During Therapy [facility] for tasks assessed/performed               Past Medical History:  Diagnosis Date   Atrial fibrillation ([place])    Benign breast cyst in female, left 10/07/2016   CAP (community acquired pneumonia) 02/15/2021   Colon adenomas    GERD (gastroesophageal reflux disease)    HCAP (healthcare-associated pneumonia) 02/02/2021   Hypertension    Hypothyroidism    Macular degeneration    Mitral regurgitation    Pulmonary embolism ([place]) 02/2016   SVT (supraventricular tachycardia) (HCC)    Past Surgical History:  Procedure Laterality Date   APPENDECTOMY  1960   BREAST CYST ASPIRATION Left 2017   CARDIOVERSION N/A 04/05/2016   Procedure: Cardioversion;  Surgeon: [name], MD;  Location: [facility];  Service: Cardiovascular;  Laterality: N/A;   CHOLECYSTECTOMY  1985   ESOPHAGOGASTRODUODENOSCOPY N/A 03/10/2021   Procedure: ESOPHAGOGASTRODUODENOSCOPY (EGD);  Surgeon: Toledo, Benay Pike, MD;  Location: [facility];  Service: Gastroenterology;  Laterality: N/A;   ESOPHAGOGASTRODUODENOSCOPY (EGD) WITH PROPOFOL N/A 11/27/2019   Procedure: ESOPHAGOGASTRODUODENOSCOPY (EGD) WITH PROPOFOL;  Surgeon: Lesly Rubenstein, MD;  Location: [facility];  Service: Endoscopy;  Laterality: N/A;   ESOPHAGOGASTRODUODENOSCOPY (EGD) WITH PROPOFOL N/A 12/24/2019   Procedure: ESOPHAGOGASTRODUODENOSCOPY (EGD) WITH PROPOFOL;  Surgeon:  Lesly Rubenstein, MD;  Location: [facility];  Service: Endoscopy;  Laterality: N/A;   IR CT HEAD LTD  11/01/2020   IR PERCUTANEOUS ART THROMBECTOMY/INFUSION INTRACRANIAL INC DIAG ANGIO  11/01/2020   IR US GUIDE VASC ACCESS RIGHT  11/02/2020   OVARIAN CYST REMOVAL     RADIOLOGY WITH ANESTHESIA N/A 11/01/2020   Procedure: RADIOLOGY WITH ANESTHESIA;  Surgeon: Luanne Bras, MD;  Location: [place];  Service: Radiology;  Laterality: N/A;   TEE WITHOUT CARDIOVERSION N/A 02/01/2018   Procedure: TRANSESOPHAGEAL ECHOCARDIOGRAM (TEE);  Surgeon: [name], MD;  Location: [facility];  Service: Cardiovascular;  Laterality: N/A;   Patient Active Problem List   Diagnosis Date Noted   Candida esophagitis ([place]) 05/03/2021   Vertebral fracture, osteoporotic, sequela 02/23/2021   Severe sepsis ([place]) 02/02/2021   Depression with anxiety 02/02/2021   Iron deficiency anemia 01/25/2021   Symptomatic anemia 01/13/2021   History of embolic stroke 11/91/4782   Inflamed external hemorrhoid 12/08/2020   Insomnia due to anxiety and fear 12/08/2020   Long term (current) use of anticoagulants    Chronic HFrEF (heart failure with reduced ejection fraction) ([place])    Cerebrovascular disease 11/04/2020   Malnutrition of moderate degree ([place]) 11/03/2020   Prolonged QT interval    Persistent atrial fibrillation ([place]) 10/30/2020   ILD (interstitial lung disease) ([place]) 09/22/2020   Atrial flutter, paroxysmal ([place]) 09/10/2020   Tachyarrhythmia 09/08/2020   Sick sinus syndrome ([place]) 09/08/2020   Elevated serum free T4 level 09/08/2020   Purpura  senilis ([place]) 08/28/2020   Hiatal hernia 08/09/2020   Acquired thrombophilia ([place]) 08/06/2020   Elevated troponin I level 05/21/2020   CVA (cerebral vascular accident) ([place]) 03/24/2020   History of COVID-19 03/03/2020   Bilateral carotid artery stenosis 12/23/2019   Schatzki's ring of distal esophagus 12/11/2019   Erosive gastritis 11/30/2019   Dysphagia  11/27/2019   Painful swallowing 11/27/2019   Gastroesophageal reflux disease 11/27/2019   Stage 3a chronic kidney disease ([place]) 04/11/2019   Essential hypertension 16/11/9602   Chronic systolic CHF (congestive heart failure) ([place]) 08/15/2018   Hypothyroidism due to acquired atrophy of thyroid 05/08/2018   Unintentional weight loss 05/08/2018   Myalgia due to statin 05/08/2018   Abdominal aortic atherosclerosis ([place]) 05/07/2018   Bradycardia 04/28/2018   Pleural effusion on left 02/27/2018   Moderate mitral stenosis 01/10/2018   Lumbar radiculitis 01/07/2018   B12 deficiency 07/26/2017   Atrial fibrillation status post cardioversion ([place]) 04/06/2016   Hospital discharge follow-up 03/10/2016   Vitamin D deficiency 04/08/2015   Cervical spine degeneration 12/13/2014   History of pulmonary embolism 12/02/2014   Insomnia 10/04/2014   Moderate tricuspid insufficiency 09/17/2014   Generalized anxiety disorder 03/28/2013   Mitral valve prolapse 03/28/2013   Routine adult health maintenance 03/23/2012   Fatigue 03/23/2012   Cough 04/17/2011   Screening for breast cancer 11/29/2010   Macular degeneration, left eye 11/29/2010   Screening for colon cancer 11/29/2010   Hyperlipidemia LDL goal <70 11/29/2010   GERD (gastroesophageal reflux disease)     REFERRING DIAG: M80.08XS (ICD-10-CM) - Fracture of vertebra due to osteoporosis, sequela  THERAPY DIAG:  Other low back pain  Difficulty in walking, not elsewhere classified  Pain in thoracic spine  Muscle weakness (generalized)  Rationale for Evaluation and Treatment Rehabilitation  PERTINENT HISTORY: T11 Burst Fracture December 2022.Marland Kitchen Pt fell December 2022 which resulsted in compression fracture. Pt was sick, went to the bathroom and fell onto her rear end. Has a mild stroke Semptember 2022 which took her a while to get over. CVA affected R UE and LE. L lateral arm also bothers her. The vision in R eye less as well. Still has to take  it easy when she walks. She has a rw and stopped using. She also fell 2 months ago (May 2022) when getting up from the recliner. She kept going foward when she got up from the recliner because she could not get the foot rest from the recliner down. Back gets really tired. Had PT after her CVA.  PRECAUTIONS: fall risk, T11 fracture  SUBJECTIVE: Pt states that she does not really feel good and that her blood pressure is high. Feels weak and shaky. Ate breakfast, half of a deviled egg sandwhich and drank a little bit of water. Pt went to her cardiologist last Thursday. Heart rate was fine during the doctor visit. Doctor said it was a good idea to continue PT per other daughter [name]. No back pain currently. Just feels weak, and just a little bit of light headedness but not much. Not taking blood pressure medication.  No back pain, just feels weak.     PAIN:  Are you having pain? 0/10 currently    TODAY'S TREATMENT:     Other daughter (eldest)/ caregiver present Woodfin Ganja)   Therapeutic exercise 10/12/21:   Vitals seated:   BP: 169/50  HR: 44 BPM   Seated B scapular retraction 10x5 seconds for 3 sets  Seated transversus abdominis contraction 10x5 seconds   Vitals seated:  BP: 170/45  HR: 43 BPM   SpO2   97% room air;  HR 44  Longer rest break provided.   Vitals seated:   BP: 173/64  HR:  42 BPM    After a few minutes of rest Vitals seated:   BP: 179/55  HR:  41 BPM      Therapeutic seated rest breaks throughout session   Session stopped secondary to elevated blood pressure levels.      Improved exercise technique, movement at target joints, use of target muscles after mod verbal, visual, tactile cues.      Response to treatment Pt tolerated session well without aggravation of back pain. Limited in exertional activity due to vitals     Clinical impression  Very limited exercises today secondary to elevated blood pressure and reports of minor  lightheadedness. Monitored vitals. Pt in no distress observed during and at end of session. Pt and daughter/caregiver were recommended to contact her doctor pertaining to her elevated blood pressure. Both verbalized understanding. Worked on gentle trunk strengthening and thoracic extension to help decreased stress to lower back. Pt tolerated session well without aggravation of back pain. Pt will benefit from continued skilled physical therapy services to improve strength, activity tolerance, trunk stability, and function.       PATIENT EDUCATION: Education details: there-ex, monitoring BP, sleep hygiene. Person educated: Patient Education method: Explanation, Demonstration, Tactile cues, Verbal cues, and Handouts Education comprehension: verbalized understanding and returned demonstration   HOME EXERCISE PROGRAM: Home Exercise Program   Access Code: [email or web address] URL: [email or web address] Date: 09/09/2021 Prepared by: Joneen Boers   Exercises - Seated Transversus Abdominis Bracing  - 3 x daily - 7 x weekly - 3 sets - 5 reps - 5 seconds hold - Seated Scapular Retraction  - 1 x daily - 7 x weekly - 2 - 3 sets - 10 reps - 5 seconds hold     PT Short Term Goals - 09/09/21 1742       PT SHORT TERM GOAL #1   Title Pt will be independent with her initial HEP to decrease pain, improve trunk strength and ability to perform standing tasks more comfortably.    Baseline Pt has started her initial HEP (09/09/2021)    Time 3    Period Weeks    Status New    Target Date 09/30/21              PT Long Term Goals - 09/09/21 1745       PT LONG TERM GOAL #1   Title Pt will have a decrease in back pain to 4/10 or less at worst to promote ability to perform standing tasks as well as standing up from sitting more comfortably.    Baseline 9/10 back pain at worst for the past 3 months (09/09/2021)    Time 8    Period Weeks    Status New    Target Date 11/04/21      PT LONG TERM  GOAL #2   Title Pt will improve her FOTO score by at least 10 points as a demonstration of improved function.    Baseline Lumbar spine FOTO 36 (09/09/2021)    Time 8    Period Weeks    Status New    Target Date 11/04/21      PT LONG TERM GOAL #3   Title Pt will improve bilateral hip extension and abduction strength by at least 1/2 MMT grade to promote ability to perform standing tasks  more comfortably.    Baseline Seated manually resisted: hip extension 4/5 R, 4-/5 L, hip abduction 4/5 R and L (09/09/2021)    Time 8    Period Weeks    Status New    Target Date 11/04/21              Plan - 10/12/21 0850     Clinical Impression Statement Very limited exercises today secondary to elevated blood pressure and reports of minor lightheadedness. Monitored vitals. Pt in no distress observed during and at end of session. Pt and daughter/caregiver were recommended to contact her doctor pertaining to her elevated blood pressure. Both verbalized understanding. Worked on gentle trunk strengthening and thoracic extension to help decreased stress to lower back. Pt tolerated session well without aggravation of back pain. Pt will benefit from continued skilled physical therapy services to improve strength, activity tolerance, trunk stability, and function.    Personal Factors and Comorbidities Age;Comorbidity 3+;Past/Current Experience;Time since onset of injury/illness/exacerbation;Fitness    Comorbidities Atrial fibrillation, HTN, hx of CVA    Examination-Activity Limitations Stand;Lift;Locomotion Level;Transfers    Stability/Clinical Decision Making Stable/Uncomplicated    Rehab Potential Fair    PT Frequency 2x / week    PT Duration 8 weeks    PT Treatment/Interventions Therapeutic exercise;Therapeutic activities;Aquatic Therapy;Electrical Stimulation;Manual techniques;Dry needling;Gait training;Stair training;Functional mobility training;Balance training;Neuromuscular re-education;Patient/family  education    PT Next Visit Plan Posture, trunk and hip strengthening, balance, trunk stability, manual techniques, modalities PRN    PT Home Exercise Plan Medbridge Access Code: [email or web address]    Consulted and Agree with Plan of Care Patient;Family member/caregiver    Family Member Consulted daugther               [place] PT, DPT  10/12/2021, 12:24 PM

## 2021-10-13 ENCOUNTER — Telehealth: Payer: Self-pay | Admitting: Pulmonary Disease

## 2021-10-13 DIAGNOSIS — I639 Cerebral infarction, unspecified: Secondary | ICD-10-CM | POA: Diagnosis not present

## 2021-10-13 DIAGNOSIS — I5022 Chronic systolic (congestive) heart failure: Secondary | ICD-10-CM | POA: Diagnosis not present

## 2021-10-13 NOTE — Telephone Encounter (Signed)
[name] is calling to ask if she is needing to see Dr. Patsey Berthold since it has been a while.  Daughter is concerned because the BP has been running a little high and she does have some SOB since last visit a while ago.  Please advise and call daughter to see if she should schedule appt. To be seen.

## 2021-10-13 NOTE — Telephone Encounter (Signed)
[name], Pamela(dpr) is aware of below message/recommendations and voiced her understanding.  She stated that patient is taking protonix BID.  She will keep scheduled appt. Nothing further needed.

## 2021-10-13 NOTE — Telephone Encounter (Signed)
Spoke to patient's daughter, Pamela(DPR). [name] stated that patient has had high blood pressure, SOB and shaking episodes since last OV. Episodes occur multiple times a week and at random times. Cardiology has been made aware of BP. Denied f/c/s or additional sx.  Using xopenex solution BID with some relief in sx.  Patient past due for appt. Appt scheduled 12/08/2021 at 10:30, as this is first available.   Dr. Patsey Berthold, please advise. Thanks

## 2021-10-14 ENCOUNTER — Other Ambulatory Visit: Payer: Self-pay

## 2021-10-14 ENCOUNTER — Ambulatory Visit: Payer: Medicare HMO

## 2021-10-19 ENCOUNTER — Ambulatory Visit: Payer: Medicare HMO | Attending: Internal Medicine

## 2021-10-20 ENCOUNTER — Telehealth: Payer: Self-pay

## 2021-10-20 NOTE — Telephone Encounter (Signed)
No show. Called patient and left a message pertaining to appointment and a reminder for the next follow up session. Return phone call requested. Phone number ([phone]) provided.   

## 2021-10-21 ENCOUNTER — Ambulatory Visit: Payer: Medicare HMO

## 2021-10-23 DIAGNOSIS — J189 Pneumonia, unspecified organism: Secondary | ICD-10-CM | POA: Diagnosis not present

## 2021-10-27 ENCOUNTER — Telehealth: Payer: Self-pay

## 2021-10-27 MED ORDER — MIRTAZAPINE 15 MG PO TABS: 15.0000 mg | ORAL_TABLET | Freq: Every day | ORAL | 0 refills | Status: DC

## 2021-10-27 NOTE — Telephone Encounter (Signed)
Pt is aware.  

## 2021-10-27 NOTE — Telephone Encounter (Signed)
Medication has been refilled.

## 2021-10-27 NOTE — Telephone Encounter (Signed)
[name], [name], called to state patient needs a refill for her mirtazapine (REMERON) 15 MG tablet.  [name] states patient is out of medication.  [name] states patient's preferred pharmacy is [facility] on [address].

## 2021-10-28 DIAGNOSIS — I4819 Other persistent atrial fibrillation: Secondary | ICD-10-CM | POA: Diagnosis not present

## 2021-10-28 DIAGNOSIS — I4892 Unspecified atrial flutter: Secondary | ICD-10-CM | POA: Diagnosis not present

## 2021-10-28 DIAGNOSIS — I10 Essential (primary) hypertension: Secondary | ICD-10-CM | POA: Diagnosis not present

## 2021-10-28 DIAGNOSIS — I5023 Acute on chronic systolic (congestive) heart failure: Secondary | ICD-10-CM | POA: Diagnosis not present

## 2021-10-28 DIAGNOSIS — I341 Nonrheumatic mitral (valve) prolapse: Secondary | ICD-10-CM | POA: Diagnosis not present

## 2021-10-28 DIAGNOSIS — I5020 Unspecified systolic (congestive) heart failure: Secondary | ICD-10-CM | POA: Diagnosis not present

## 2021-10-28 DIAGNOSIS — I050 Rheumatic mitral stenosis: Secondary | ICD-10-CM | POA: Diagnosis not present

## 2021-10-28 DIAGNOSIS — R001 Bradycardia, unspecified: Secondary | ICD-10-CM | POA: Diagnosis not present

## 2021-10-28 DIAGNOSIS — I480 Paroxysmal atrial fibrillation: Secondary | ICD-10-CM | POA: Diagnosis not present

## 2021-11-08 ENCOUNTER — Other Ambulatory Visit: Payer: Self-pay | Admitting: Internal Medicine

## 2021-11-13 DIAGNOSIS — I639 Cerebral infarction, unspecified: Secondary | ICD-10-CM | POA: Diagnosis not present

## 2021-11-16 ENCOUNTER — Encounter
Admission: RE | Disposition: A | Discharge status: Home / Self Care | Payer: Self-pay | Source: Home / Self Care | Attending: Cardiology

## 2021-11-16 ENCOUNTER — Other Ambulatory Visit: Payer: Self-pay

## 2021-11-16 ENCOUNTER — Encounter: Payer: Self-pay | Admitting: Cardiology

## 2021-11-16 ENCOUNTER — Observation Stay
Admission: RE | Admit: 2021-11-16 | Discharge: 2021-11-17 | Disposition: A | Discharge status: Home / Self Care | Payer: Medicare HMO | Attending: Cardiology | Admitting: Cardiology

## 2021-11-16 DIAGNOSIS — R001 Bradycardia, unspecified: Secondary | ICD-10-CM

## 2021-11-16 DIAGNOSIS — Z006 Encounter for examination for normal comparison and control in clinical research program: Secondary | ICD-10-CM | POA: Insufficient documentation

## 2021-11-16 DIAGNOSIS — I130 Hypertensive heart and chronic kidney disease with heart failure and stage 1 through stage 4 chronic kidney disease, or unspecified chronic kidney disease: Secondary | ICD-10-CM | POA: Diagnosis not present

## 2021-11-16 DIAGNOSIS — Z8673 Personal history of transient ischemic attack (TIA), and cerebral infarction without residual deficits: Secondary | ICD-10-CM | POA: Insufficient documentation

## 2021-11-16 DIAGNOSIS — I480 Paroxysmal atrial fibrillation: Secondary | ICD-10-CM | POA: Diagnosis not present

## 2021-11-16 DIAGNOSIS — I4892 Unspecified atrial flutter: Secondary | ICD-10-CM | POA: Insufficient documentation

## 2021-11-16 DIAGNOSIS — I5023 Acute on chronic systolic (congestive) heart failure: Secondary | ICD-10-CM | POA: Diagnosis not present

## 2021-11-16 DIAGNOSIS — N1831 Chronic kidney disease, stage 3a: Secondary | ICD-10-CM | POA: Diagnosis not present

## 2021-11-16 DIAGNOSIS — Z7901 Long term (current) use of anticoagulants: Secondary | ICD-10-CM | POA: Insufficient documentation

## 2021-11-16 DIAGNOSIS — Z79899 Other long term (current) drug therapy: Secondary | ICD-10-CM | POA: Diagnosis not present

## 2021-11-16 DIAGNOSIS — E039 Hypothyroidism, unspecified: Secondary | ICD-10-CM | POA: Insufficient documentation

## 2021-11-16 DIAGNOSIS — I495 Sick sinus syndrome: Secondary | ICD-10-CM | POA: Diagnosis not present

## 2021-11-16 DIAGNOSIS — I482 Chronic atrial fibrillation, unspecified: Secondary | ICD-10-CM | POA: Diagnosis not present

## 2021-11-16 HISTORY — PX: PACEMAKER LEADLESS INSERTION: EP1219

## 2021-11-16 SURGERY — PACEMAKER LEADLESS INSERTION
Anesthesia: Moderate Sedation

## 2021-11-16 MED ORDER — HEPARIN SODIUM (PORCINE) 1000 UNIT/ML IJ SOLN
INTRAMUSCULAR | Status: DC | PRN
  Administered 2021-11-16: 2500 [IU] via INTRAVENOUS

## 2021-11-16 MED ORDER — FENTANYL CITRATE (PF) 100 MCG/2ML IJ SOLN
INTRAMUSCULAR | Status: DC | PRN
  Administered 2021-11-16: 25 ug via INTRAVENOUS

## 2021-11-16 MED ORDER — ACETAMINOPHEN 325 MG PO TABS
650.0000 mg | ORAL_TABLET | ORAL | Status: DC | PRN
  Administered 2021-11-16: 650 mg via ORAL
  Filled 2021-11-16: qty 2

## 2021-11-16 MED ORDER — FENTANYL CITRATE (PF) 100 MCG/2ML IJ SOLN
INTRAMUSCULAR | Status: AC
  Filled 2021-11-16: qty 2

## 2021-11-16 MED ORDER — LIDOCAINE HCL 1 % IJ SOLN
INTRAMUSCULAR | Status: AC
  Filled 2021-11-16: qty 20

## 2021-11-16 MED ORDER — SODIUM CHLORIDE 0.9% FLUSH: 3.0000 mL | INTRAVENOUS | Status: DC | PRN

## 2021-11-16 MED ORDER — MIDAZOLAM HCL 2 MG/2ML IJ SOLN
INTRAMUSCULAR | Status: AC
  Filled 2021-11-16: qty 2

## 2021-11-16 MED ORDER — PANTOPRAZOLE SODIUM 20 MG PO TBEC
20.0000 mg | DELAYED_RELEASE_TABLET | Freq: Every day | ORAL | Status: DC
  Filled 2021-11-16: qty 1

## 2021-11-16 MED ORDER — FERROUS SULFATE 325 (65 FE) MG PO TABS
325.0000 mg | ORAL_TABLET | Freq: Every day | ORAL | Status: DC
  Filled 2021-11-16: qty 1

## 2021-11-16 MED ORDER — METOPROLOL SUCCINATE ER 50 MG PO TB24: 50.0000 mg | ORAL_TABLET | Freq: Every day | ORAL | Status: DC

## 2021-11-16 MED ORDER — HEPARIN (PORCINE) IN NACL 1000-0.9 UT/500ML-% IV SOLN
INTRAVENOUS | Status: AC
  Filled 2021-11-16: qty 1000

## 2021-11-16 MED ORDER — LEVOTHYROXINE SODIUM 50 MCG PO TABS
50.0000 ug | ORAL_TABLET | Freq: Every day | ORAL | Status: DC
  Administered 2021-11-17: 50 ug via ORAL
  Filled 2021-11-16: qty 1

## 2021-11-16 MED ORDER — FENTANYL CITRATE (PF) 100 MCG/2ML IJ SOLN
50.0000 ug | Freq: Once | INTRAMUSCULAR | Status: AC
  Administered 2021-11-16: 50 ug via INTRAVENOUS

## 2021-11-16 MED ORDER — SODIUM CHLORIDE 0.9% FLUSH
3.0000 mL | Freq: Two times a day (BID) | INTRAVENOUS | Status: DC
  Administered 2021-11-16: 3 mL via INTRAVENOUS

## 2021-11-16 MED ORDER — SODIUM CHLORIDE 0.9 % IV SOLN: 250.0000 mL | INTRAVENOUS | Status: DC | PRN

## 2021-11-16 MED ORDER — HEPARIN (PORCINE) IN NACL 1000-0.9 UT/500ML-% IV SOLN
INTRAVENOUS | Status: DC | PRN
  Administered 2021-11-16: 1000 mL

## 2021-11-16 MED ORDER — IOHEXOL 300 MG/ML  SOLN
INTRAMUSCULAR | Status: DC | PRN
  Administered 2021-11-16: 10 mL

## 2021-11-16 MED ORDER — LIDOCAINE HCL (PF) 1 % IJ SOLN
INTRAMUSCULAR | Status: DC | PRN
  Administered 2021-11-16: 5 mL
  Administered 2021-11-16: 20 mL

## 2021-11-16 MED ORDER — HEPARIN SODIUM (PORCINE) 1000 UNIT/ML IJ SOLN
INTRAMUSCULAR | Status: AC
  Filled 2021-11-16: qty 10

## 2021-11-16 MED ORDER — POTASSIUM CHLORIDE CRYS ER 20 MEQ PO TBCR: 20.0000 meq | EXTENDED_RELEASE_TABLET | Freq: Every day | ORAL | Status: DC

## 2021-11-16 MED ORDER — SODIUM CHLORIDE 0.9 % IV SOLN: INTRAVENOUS | Status: DC

## 2021-11-16 MED ORDER — SODIUM CHLORIDE 0.9% FLUSH: 3.0000 mL | Freq: Two times a day (BID) | INTRAVENOUS | Status: DC

## 2021-11-16 MED ORDER — MIDAZOLAM HCL 2 MG/2ML IJ SOLN
INTRAMUSCULAR | Status: DC | PRN
  Administered 2021-11-16: 1 mg via INTRAVENOUS

## 2021-11-16 SURGICAL SUPPLY — 15 items
DILATOR VESSEL 38 20CM 12FR (INTRODUCER) IMPLANT
DILATOR VESSEL 38 20CM 14FR (INTRODUCER) IMPLANT
DILATOR VESSEL 38 20CM 18FR (INTRODUCER) IMPLANT
DILATOR VESSEL 38 20CM 8FR (INTRODUCER) IMPLANT
MICRA AV TRANSCATH PACING SYS (Pacemaker) ×1 IMPLANT
MICRA INTRODUCER SHEATH (SHEATH) ×1
NDL PERC 18GX7CM (NEEDLE) IMPLANT
NEEDLE PERC 18GX7CM (NEEDLE) ×1 IMPLANT
PACK CARDIAC CATH (CUSTOM PROCEDURE TRAY) ×1 IMPLANT
PAD ELECT DEFIB RADIOL ZOLL (MISCELLANEOUS) IMPLANT
SHEATH AVANTI 7FRX11 (SHEATH) IMPLANT
SHEATH INTRODUCER MICRA (SHEATH) IMPLANT
SUT SILK 0 FSL (SUTURE) IMPLANT
SYSTEM PACING TRNSCTH AV MICRA (Pacemaker) IMPLANT
WIRE AMPLATZ SS-J .035X180CM (WIRE) IMPLANT

## 2021-11-16 NOTE — Progress Notes (Signed)
MD has been messaged regarding pt c/o 10/10 back pain from lying flat and unable to take PO PRN tylenol at this time. Have readjusted pt's position x2 as much as able to with restrictions for groin stick . MD has also been messaged regarding BP and is aware. Orders for PO metoprolol succinate, let MD know that pt would not be able to take until 1420 when seh could sit up.  Awaiting new orders.

## 2021-11-17 ENCOUNTER — Other Ambulatory Visit: Payer: Self-pay | Admitting: Internal Medicine

## 2021-11-17 ENCOUNTER — Encounter: Payer: Self-pay | Admitting: Cardiology

## 2021-11-17 DIAGNOSIS — I482 Chronic atrial fibrillation, unspecified: Secondary | ICD-10-CM | POA: Diagnosis not present

## 2021-11-17 DIAGNOSIS — Z006 Encounter for examination for normal comparison and control in clinical research program: Secondary | ICD-10-CM | POA: Diagnosis not present

## 2021-11-17 DIAGNOSIS — Z79899 Other long term (current) drug therapy: Secondary | ICD-10-CM | POA: Diagnosis not present

## 2021-11-17 DIAGNOSIS — I495 Sick sinus syndrome: Secondary | ICD-10-CM | POA: Diagnosis not present

## 2021-11-17 DIAGNOSIS — I130 Hypertensive heart and chronic kidney disease with heart failure and stage 1 through stage 4 chronic kidney disease, or unspecified chronic kidney disease: Secondary | ICD-10-CM | POA: Diagnosis not present

## 2021-11-17 DIAGNOSIS — N1831 Chronic kidney disease, stage 3a: Secondary | ICD-10-CM | POA: Diagnosis not present

## 2021-11-17 DIAGNOSIS — Z7901 Long term (current) use of anticoagulants: Secondary | ICD-10-CM | POA: Diagnosis not present

## 2021-11-17 DIAGNOSIS — Z8673 Personal history of transient ischemic attack (TIA), and cerebral infarction without residual deficits: Secondary | ICD-10-CM | POA: Diagnosis not present

## 2021-11-17 DIAGNOSIS — I4892 Unspecified atrial flutter: Secondary | ICD-10-CM | POA: Diagnosis not present

## 2021-11-17 DIAGNOSIS — E039 Hypothyroidism, unspecified: Secondary | ICD-10-CM | POA: Diagnosis not present

## 2021-11-17 DIAGNOSIS — Z950 Presence of cardiac pacemaker: Secondary | ICD-10-CM | POA: Diagnosis not present

## 2021-11-17 DIAGNOSIS — I480 Paroxysmal atrial fibrillation: Secondary | ICD-10-CM | POA: Diagnosis not present

## 2021-11-17 LAB — BASIC METABOLIC PANEL
Anion gap: 4 — ABNORMAL LOW (ref 5–15)
BUN: 16 mg/dL (ref 8–23)
CO2: 25 mmol/L (ref 22–32)
Calcium: 9 mg/dL (ref 8.9–10.3)
Chloride: 111 mmol/L (ref 98–111)
Creatinine, Ser: 0.97 mg/dL (ref 0.44–1.00)
GFR, Estimated: 56 mL/min — ABNORMAL LOW (ref >60)
Glucose, Bld: 95 mg/dL (ref 70–99)
Potassium: 4.3 mmol/L (ref 3.5–5.1)
Sodium: 140 mmol/L (ref 135–145)

## 2021-11-17 NOTE — Progress Notes (Signed)
Frances Kent to be D/C'd Home per MD order.  Discussed prescriptions and follow up appointments with the patient. NO Prescriptions given to patient, medication list explained in detail. Pt verbalized understanding. Pacemaker after care instruction given and gone over with patient and daughter  Allergies as of 11/17/2021       Reactions   Statins    Severe myalgias        Medication List     STOP taking these medications    ALPRAZolam 0.25 MG tablet Commonly known as: XANAX   nystatin 100000 UNIT/ML suspension Commonly known as: MYCOSTATIN   senna-docusate 8.6-50 MG tablet Commonly known as: Senokot-S       TAKE these medications    acetaminophen 500 MG tablet Commonly known as: TYLENOL Take 500 mg by mouth every 6 (six) hours as needed.   amiodarone 200 MG tablet Commonly known as: PACERONE Take 0.5 tablets (100 mg total) by mouth 2 (two) times daily. Home med.   ferrous sulfate 325 (65 FE) MG EC tablet Take 1 tablet (325 mg total) by mouth 2 (two) times daily.   furosemide 20 MG tablet Commonly known as: LASIX Take 2 tablets (40 mg total) by mouth daily.   levalbuterol 0.63 MG/3ML nebulizer solution Commonly known as: Xopenex Take 3 mLs (0.63 mg total) by nebulization every 6 (six) hours as needed for wheezing or shortness of breath.   levothyroxine 50 MCG tablet Commonly known as: SYNTHROID TAKE 1 TABLET BY MOUTH ONCE DAILY AT  6AM   mirtazapine 15 MG tablet Commonly known as: REMERON Take 1 tablet (15 mg total) by mouth at bedtime.   multivitamin with minerals Tabs tablet Take 1 tablet by mouth daily.   pantoprazole 20 MG tablet Commonly known as: PROTONIX Take 1 tablet (20 mg total) by mouth 2 (two) times daily before a meal.   potassium chloride SA 20 MEQ tablet Commonly known as: KLOR-CON M Take 20 mEq by mouth every other day.   Xarelto 15 MG Tabs tablet Generic drug: Rivaroxaban TAKE 1 TABLET EVERY DAY        Vitals:   11/17/21  0427 11/17/21 0828  BP: (!) 132/52 (!) 151/60  Pulse: (!) 52 (!) 52  Resp: 20 18  Temp: 97.9 F (36.6 C) 97.8 F (36.6 C)  SpO2: 95% 95%    Skin clean, dry and intact without evidence of skin break down, no evidence of skin tears noted. IV catheter discontinued intact. Site without signs and symptoms of complications. Dressing and pressure applied. Pt denies pain at this time. No complaints noted.  An After Visit Summary was printed and given to the patient. Patient escorted via [place], and D/C home via private auto.  [place]

## 2021-11-17 NOTE — Discharge Summary (Signed)
Physician Discharge Summary  Patient ID: Frances Kent MRN: [id number] DOB/AGE: 1930/02/28 88 y.o.  Admit date: 11/16/2021 Discharge date: 11/17/2021  Primary Discharge Diagnosis sinus syndrome Secondary Discharge Diagnosis atrial fibrillation  Significant Diagnostic Studies: yes  Consults: None  Hospital Course: The patient underwent elective Micra AV leadless pacemaker implantation on 69/07/2950 complication.  The patient was observed overnight on telemetry without incident.  On the morning of 11/17/2021 patient ambulated without difficulty and was discharged home in stable condition.   Discharge Exam: Blood pressure (!) 151/60, pulse (!) 52, temperature 97.8 F (36.6 C), resp. rate 18, height '5\' 3"'$  (1.6 m), weight 49.8 kg, SpO2 95 %.   General appearance: alert Head: Normocephalic, without obvious abnormality, atraumatic Eyes: conjunctivae/corneas clear. PERRL, EOM's intact. Fundi benign. Ears: normal TM's and external ear canals both ears Nose: Nares normal. Septum midline. Mucosa normal. No drainage or sinus tenderness. Throat: lips, mucosa, and tongue normal; teeth and gums normal Neck: no adenopathy, no carotid bruit, no JVD, supple, symmetrical, trachea midline, and thyroid not enlarged, symmetric, no tenderness/mass/nodules Back: symmetric, no curvature. ROM normal. No CVA tenderness. Resp: clear to auscultation bilaterally Chest wall: no tenderness Cardio: regular rate and rhythm, S1, S2 normal, no murmur, click, rub or gallop GI: soft, non-tender; bowel sounds normal; no masses,  no organomegaly Extremities: extremities normal, atraumatic, no cyanosis or edema Pulses: 2+ and symmetric Skin: Skin color, texture, turgor normal. No rashes or lesions Lymph nodes: Cervical, supraclavicular, and axillary nodes normal. Neurologic: Grossly normal Incision/Wound: No hematoma Labs:   Lab Results  Component Value Date   WBC 4.6 05/03/2021   HGB 12.5 05/03/2021   HCT  38.4 05/03/2021   MCV 90.0 05/03/2021   PLT 220.0 05/03/2021    Recent Labs  Lab 11/17/21 0515  NA 140  K 4.3  CL 111  CO2 25  BUN 16  CREATININE 0.97  CALCIUM 9.0  GLUCOSE 95      Radiology:  EKG: Sinus rhythm 53 bpm  FOLLOW UP PLANS AND APPOINTMENTS  Allergies as of 11/17/2021       Reactions   Statins    Severe myalgias        Medication List     STOP taking these medications    ALPRAZolam 0.25 MG tablet Commonly known as: XANAX   nystatin 100000 UNIT/ML suspension Commonly known as: MYCOSTATIN   senna-docusate 8.6-50 MG tablet Commonly known as: Senokot-S       TAKE these medications    acetaminophen 500 MG tablet Commonly known as: TYLENOL Take 500 mg by mouth every 6 (six) hours as needed.   amiodarone 200 MG tablet Commonly known as: PACERONE Take 0.5 tablets (100 mg total) by mouth 2 (two) times daily. Home med.   ferrous sulfate 325 (65 FE) MG EC tablet Take 1 tablet (325 mg total) by mouth 2 (two) times daily.   furosemide 20 MG tablet Commonly known as: LASIX Take 2 tablets (40 mg total) by mouth daily.   levalbuterol 0.63 MG/3ML nebulizer solution Commonly known as: Xopenex Take 3 mLs (0.63 mg total) by nebulization every 6 (six) hours as needed for wheezing or shortness of breath.   levothyroxine 50 MCG tablet Commonly known as: SYNTHROID TAKE 1 TABLET BY MOUTH ONCE DAILY AT  6AM   mirtazapine 15 MG tablet Commonly known as: REMERON Take 1 tablet (15 mg total) by mouth at bedtime.   multivitamin with minerals Tabs tablet Take 1 tablet by mouth daily.   pantoprazole 20  MG tablet Commonly known as: PROTONIX Take 1 tablet (20 mg total) by mouth 2 (two) times daily before a meal.   potassium chloride SA 20 MEQ tablet Commonly known as: KLOR-CON M Take 20 mEq by mouth every other day.   Xarelto 15 MG Tabs tablet Generic drug: Rivaroxaban TAKE 1 TABLET EVERY DAY        Follow-up Information     [name],  MD Follow up in 1 week(s).   Specialty: Cardiology Contact information: [address] [facility] [place] [address] [phone]                 BRING ALL MEDICATIONS WITH YOU TO FOLLOW UP APPOINTMENTS  Time spent with patient to include physician time: 25 minutes Signed:  Isaias Cowman MD, PhD, [facility] 11/17/2021, 8:41 AM

## 2021-11-17 NOTE — Progress Notes (Signed)
  Transition of Care [facility]) Screening Note   Patient Details  Name: Frances Kent Date of Birth: 1930-03-14   Transition of Care [facility] CM/SW Contact:    Alberteen Sam, LCSW Phone Number: 11/17/2021, 8:53 AM    Transition of Care Department [facility]) has reviewed patient and no TOC needs have been identified at this time. We will continue to monitor patient advancement through interdisciplinary progression rounds. If new patient transition needs arise, please place a TOC consult.  [place], [place]

## 2021-11-17 NOTE — Discharge Instructions (Signed)
Resume Xarelto on 11/18/2021.  Patient may shower on 11/19/2021 at which time may remove dressing and replace with bandage.

## 2021-11-22 DIAGNOSIS — J189 Pneumonia, unspecified organism: Secondary | ICD-10-CM | POA: Diagnosis not present

## 2021-11-23 ENCOUNTER — Ambulatory Visit (INDEPENDENT_AMBULATORY_CARE_PROVIDER_SITE_OTHER): Payer: Medicare HMO | Admitting: Internal Medicine

## 2021-11-23 ENCOUNTER — Encounter: Payer: Self-pay | Admitting: Internal Medicine

## 2021-11-23 VITALS — BP 134/70 | HR 65 | Temp 98.1°F | Ht 63.0 in | Wt 110.0 lb

## 2021-11-23 DIAGNOSIS — E785 Hyperlipidemia, unspecified: Secondary | ICD-10-CM

## 2021-11-23 DIAGNOSIS — E034 Atrophy of thyroid (acquired): Secondary | ICD-10-CM

## 2021-11-23 DIAGNOSIS — Z7901 Long term (current) use of anticoagulants: Secondary | ICD-10-CM

## 2021-11-23 DIAGNOSIS — D509 Iron deficiency anemia, unspecified: Secondary | ICD-10-CM | POA: Diagnosis not present

## 2021-11-23 DIAGNOSIS — E440 Moderate protein-calorie malnutrition: Secondary | ICD-10-CM

## 2021-11-23 DIAGNOSIS — I639 Cerebral infarction, unspecified: Secondary | ICD-10-CM | POA: Diagnosis not present

## 2021-11-23 DIAGNOSIS — I4892 Unspecified atrial flutter: Secondary | ICD-10-CM

## 2021-11-23 DIAGNOSIS — G250 Essential tremor: Secondary | ICD-10-CM | POA: Diagnosis not present

## 2021-11-23 DIAGNOSIS — R5382 Chronic fatigue, unspecified: Secondary | ICD-10-CM

## 2021-11-23 NOTE — Assessment & Plan Note (Signed)
Secondary to anorexia and malaise.  Improved s/p pacemaker implantation.  Continue mirtazapine

## 2021-11-23 NOTE — Progress Notes (Signed)
Subjective:  Patient ID: Frances Kent, female    DOB: 04/14/1930  Age: 88 y.o. MRN: [id number]  CC: The primary encounter diagnosis was Hypothyroidism due to acquired atrophy of thyroid. Diagnoses of Hyperlipidemia LDL goal <70, Long term (current) use of anticoagulants, Iron deficiency anemia, unspecified iron deficiency anemia type, Chronic fatigue, Essential tremor, Atrial flutter, paroxysmal ([place]), Cerebrovascular accident (CVA), unspecified mechanism ([place]), and Malnutrition of moderate degree ([place]) were also pertinent to this visit.   HPI Frances Kent presents for  Chief Complaint  Patient presents with   Follow-up    3 month follow up    1) BRADYCARDIA, SYMPTOMATIC: patient underwent elective Micra AV leadless pacemaker implantation on 14/05/3152 without complication.  The patient was observed overnight on telemetry without incident.  On the morning of 11/17/2021 patient ambulated without difficulty and was discharged home in stable condition.  Since discharge her blood pressure  has improved.  She has not had hypotension since pacemaker,  all readings have been above 008 systolic.  Marland Kitchen  Amidarone was suspended  temporarily but has been resumed it as of last Thursday for management of atrial fibrillation.   Sees [facility]	[place] . She states that her eEnergy level was better for a day,  but other days still feels tiired; however her  family thinks she is feeling significantly better    2) family has noticed increased tremor of both patient feels it is aggravated by being cold. No trouble eating peas and soup.    3) underweight:  she is finally gaining weight. Remains careful of food consistencies and puts pills in applesauce to avoid dysphagia   4) Back pain since vertebral fracture in July.   PT has been postponed due to hypertension .  Not taking anything for pain. . But the pain is  aggravated by walking   Outpatient Medications Prior to Visit  Medication Sig Dispense Refill    acetaminophen (TYLENOL) 500 MG tablet Take 500 mg by mouth every 6 (six) hours as needed.     amiodarone (PACERONE) 200 MG tablet Take 0.5 tablets (100 mg total) by mouth 2 (two) times daily. Home med.     ferrous sulfate 325 (65 FE) MG EC tablet Take 1 tablet (325 mg total) by mouth 2 (two) times daily. 180 tablet 1   furosemide (LASIX) 20 MG tablet Take 2 tablets (40 mg total) by mouth daily. 90 tablet 1   levalbuterol (XOPENEX) 0.63 MG/3ML nebulizer solution Take 3 mLs (0.63 mg total) by nebulization every 6 (six) hours as needed for wheezing or shortness of breath. 330 mL 0   levothyroxine (SYNTHROID) 50 MCG tablet TAKE 1 TABLET BY MOUTH ONCE DAILY AT  6AM 90 tablet 0   mirtazapine (REMERON) 15 MG tablet TAKE 1 TABLET BY MOUTH AT BEDTIME 30 tablet 0   Multiple Vitamin (MULTIVITAMIN WITH MINERALS) TABS tablet Take 1 tablet by mouth daily.     pantoprazole (PROTONIX) 20 MG tablet Take 1 tablet (20 mg total) by mouth 2 (two) times daily before a meal. 180 tablet 0   potassium chloride SA (KLOR-CON M) 20 MEQ tablet Take 20 mEq by mouth every other day.     XARELTO 15 MG TABS tablet TAKE 1 TABLET EVERY DAY 60 tablet 1   No facility-administered medications prior to visit.    Review of Systems;  Patient denies headache, fevers, malaise, unintentional weight loss, skin rash, eye pain, sinus congestion and sinus pain, sore throat, dysphagia,  hemoptysis , cough, dyspnea,  wheezing, chest pain, palpitations, orthopnea, edema, abdominal pain, nausea, melena, diarrhea, constipation, flank pain, dysuria, hematuria, urinary  Frequency, nocturia, numbness, tingling, seizures,  Focal weakness, Loss of consciousness,  Tremor, insomnia, depression, anxiety, and suicidal ideation.      Objective:  BP 134/70 (BP Location: Left Arm, Patient Position: Sitting, Cuff Size: Normal)   Pulse 65   Temp 98.1 F (36.7 C) (Oral)   Ht '5\' 3"'$  (1.6 m)   Wt 110 lb (49.9 kg)   SpO2 95%   BMI 19.49 kg/m   BP  Readings from Last 3 Encounters:  11/23/21 134/70  11/17/21 (!) 151/60  08/23/21 118/76    Wt Readings from Last 3 Encounters:  11/23/21 110 lb (49.9 kg)  11/16/21 109 lb 11.2 oz (49.8 kg)  08/23/21 107 lb 3.2 oz (48.6 kg)    General appearance: alert, cooperative and appears stated age Ears: normal TM's and external ear canals both ears Throat: lips, mucosa, and tongue normal; teeth and gums normal Neck: no adenopathy, no carotid bruit, supple, symmetrical, trachea midline and thyroid not enlarged, symmetric, no tenderness/mass/nodules Back: symmetric, no curvature. ROM normal. No CVA tenderness. Lungs: clear to auscultation bilaterally Heart: regular rate and rhythm, S1, S2 normal, no murmur, click, rub or gallop Abdomen: soft, non-tender; bowel sounds normal; no masses,  no organomegaly Pulses: 2+ and symmetric Skin: Skin color, texture, turgor normal. No rashes or lesions Lymph nodes: Cervical, supraclavicular, and axillary nodes normal. Neuro:  awake and interactive with normal mood and affect. Higher cortical functions are normal. Speech is clear without word-finding difficulty or dysarthria. Extraocular movements are intact. Visual fields of both eyes are grossly intact. Sensation to light touch is grossly intact bilaterally of upper and lower extremities. Motor examination shows 4+/5 symmetric hand grip and upper extremity and 5/5 lower extremity strength. There is no pronation or drift. Gait is non-ataxic   Lab Results  Component Value Date   HGBA1C 5.8 (H) 11/02/2020   HGBA1C 6.1 (H) 10/16/2020    Lab Results  Component Value Date   CREATININE 0.97 11/17/2021   CREATININE 1.06 (H) 03/10/2021   CREATININE 1.06 03/08/2021    Lab Results  Component Value Date   WBC 4.6 05/03/2021   HGB 12.5 05/03/2021   HCT 38.4 05/03/2021   PLT 220.0 05/03/2021   GLUCOSE 95 11/17/2021   CHOL 144 11/02/2020   TRIG 76 11/02/2020   TRIG 72 11/02/2020   HDL 48 11/02/2020    LDLDIRECT 185.3 03/27/2013   LDLCALC 81 11/02/2020   ALT 25 02/14/2021   AST 27 02/14/2021   NA 140 11/17/2021   K 4.3 11/17/2021   CL 111 11/17/2021   CREATININE 0.97 11/17/2021   BUN 16 11/17/2021   CO2 25 11/17/2021   TSH 3.09 05/03/2021   INR 3.0 (H) 10/15/2020   HGBA1C 5.8 (H) 11/02/2020    EP PPM/ICD IMPLANT  Result Date: 11/16/2021 Successful Micra AV leadless pacemaker implantation    Assessment & Plan:   Problem List Items Addressed This Visit     Hyperlipidemia LDL goal <70   Relevant Orders   Comprehensive metabolic panel   Lipid panel   Fatigue    Secondary to profound bradycardia,  Now resolved s/p pacemaker  Lab Results  Component Value Date   TSH 3.09 05/03/2021         Hypothyroidism due to acquired atrophy of thyroid - Primary   Relevant Orders   TSH   CVA (cerebral vascular accident) ([place])  History of left basal ganglia infarct  Noted I n Sept 2022.  Continue lifelong Xarelto       Malnutrition of moderate degree ([place])    Secondary to anorexia and malaise.  Improved s/p pacemaker implantation.  Continue mirtazapine       Long term (current) use of anticoagulants   Atrial flutter, paroxysmal ([facility])    Rate controlled with amiodarone and pacemaker      Essential tremor    Worse since her pacemaker implantation .  Will repeat TSH       RESOLVED: Iron deficiency anemia   Relevant Orders   IBC + Ferritin   CBC with Differential/Platelet   I spent a total of 30  minutes with this patient in a face to face visit on the date of this encounter reviewing the last office visit with me in   March,    most recent visit with cardiology  prior to pacemaker implantation ,  patient's diet and exercise habits, home blood pressure readings, recent hospitalization and procedure , and post visit ordering of testing and therapeutics.    Follow-up: Return in about 6 months (around 05/25/2022).   [name], MD

## 2021-11-23 NOTE — Assessment & Plan Note (Signed)
Secondary to profound bradycardia,  Now resolved s/p pacemaker  Lab Results  Component Value Date   TSH 3.09 05/03/2021

## 2021-11-23 NOTE — Assessment & Plan Note (Signed)
Rate controlled with amiodarone and pacemaker

## 2021-11-23 NOTE — Patient Instructions (Addendum)
So glad you got your pacemaker!   Please start taking  2000 mg of acetominophen (tylenol) every day safely  In divided doses ( 1000 mg every 12 hours.) so your back pain doesn't discourage you from walking    You need to  walk every day

## 2021-11-23 NOTE — Assessment & Plan Note (Signed)
Worse since her pacemaker implantation .  Will repeat TSH

## 2021-11-23 NOTE — Assessment & Plan Note (Signed)
History of left basal ganglia infarct  Noted I n Sept 2022.  Continue lifelong Xarelto

## 2021-11-24 DIAGNOSIS — I4819 Other persistent atrial fibrillation: Secondary | ICD-10-CM | POA: Diagnosis not present

## 2021-11-24 DIAGNOSIS — I10 Essential (primary) hypertension: Secondary | ICD-10-CM | POA: Diagnosis not present

## 2021-11-24 LAB — LIPID PANEL
Cholesterol: 257 mg/dL — ABNORMAL HIGH (ref 0–200)
HDL: 96.4 mg/dL (ref >39.00)
LDL Cholesterol: 140 mg/dL — ABNORMAL HIGH (ref 0–99)
NonHDL: 160.88
Total CHOL/HDL Ratio: 3
Triglycerides: 104 mg/dL (ref 0.0–149.0)
VLDL: 20.8 mg/dL (ref 0.0–40.0)

## 2021-11-24 LAB — COMPREHENSIVE METABOLIC PANEL
ALT: 20 U/L (ref 0–35)
AST: 22 U/L (ref 0–37)
Albumin: 3.9 g/dL (ref 3.5–5.2)
Alkaline Phosphatase: 88 U/L (ref 39–117)
BUN: 16 mg/dL (ref 6–23)
CO2: 28 mEq/L (ref 19–32)
Calcium: 8.9 mg/dL (ref 8.4–10.5)
Chloride: 104 mEq/L (ref 96–112)
Creatinine, Ser: 1.08 mg/dL (ref 0.40–1.20)
GFR: 45.63 mL/min — ABNORMAL LOW (ref >60.00)
Glucose, Bld: 94 mg/dL (ref 70–99)
Potassium: 4.1 mEq/L (ref 3.5–5.1)
Sodium: 142 mEq/L (ref 135–145)
Total Bilirubin: 0.6 mg/dL (ref 0.2–1.2)
Total Protein: 6.6 g/dL (ref 6.0–8.3)

## 2021-11-24 LAB — CBC WITH DIFFERENTIAL/PLATELET
Basophils Absolute: 0.1 10*3/uL (ref 0.0–0.1)
Basophils Relative: 1.3 % (ref 0.0–3.0)
Eosinophils Absolute: 0.1 10*3/uL (ref 0.0–0.7)
Eosinophils Relative: 1.4 % (ref 0.0–5.0)
HCT: 38 % (ref 36.0–46.0)
Hemoglobin: 12.5 g/dL (ref 12.0–15.0)
Lymphocytes Relative: 17.9 % (ref 12.0–46.0)
Lymphs Abs: 0.9 10*3/uL (ref 0.7–4.0)
MCHC: 32.9 g/dL (ref 30.0–36.0)
MCV: 97 fl (ref 78.0–100.0)
Monocytes Absolute: 0.6 10*3/uL (ref 0.1–1.0)
Monocytes Relative: 12.1 % — ABNORMAL HIGH (ref 3.0–12.0)
Neutro Abs: 3.5 10*3/uL (ref 1.4–7.7)
Neutrophils Relative %: 67.3 % (ref 43.0–77.0)
Platelets: 192 10*3/uL (ref 150.0–400.0)
RBC: 3.91 Mil/uL (ref 3.87–5.11)
RDW: 15.7 % — ABNORMAL HIGH (ref 11.5–15.5)
WBC: 5.1 10*3/uL (ref 4.0–10.5)

## 2021-11-24 LAB — IBC + FERRITIN
Ferritin: 76.8 ng/mL (ref 10.0–291.0)
Iron: 50 ug/dL (ref 42–145)
Saturation Ratios: 18.7 % — ABNORMAL LOW (ref 20.0–50.0)
TIBC: 267.4 ug/dL (ref 250.0–450.0)
Transferrin: 191 mg/dL — ABNORMAL LOW (ref 212.0–360.0)

## 2021-11-24 LAB — TSH: TSH: 3.34 u[IU]/mL (ref 0.35–5.50)

## 2021-12-07 DIAGNOSIS — H353212 Exudative age-related macular degeneration, right eye, with inactive choroidal neovascularization: Secondary | ICD-10-CM | POA: Diagnosis not present

## 2021-12-08 ENCOUNTER — Encounter: Payer: Self-pay | Admitting: Pulmonary Disease

## 2021-12-08 ENCOUNTER — Ambulatory Visit: Payer: Medicare HMO | Admitting: Pulmonary Disease

## 2021-12-08 VITALS — BP 120/78 | HR 59 | Temp 97.3°F | Ht 63.0 in | Wt 113.4 lb

## 2021-12-08 DIAGNOSIS — R0602 Shortness of breath: Secondary | ICD-10-CM

## 2021-12-08 DIAGNOSIS — I5022 Chronic systolic (congestive) heart failure: Secondary | ICD-10-CM

## 2021-12-08 DIAGNOSIS — I495 Sick sinus syndrome: Secondary | ICD-10-CM | POA: Diagnosis not present

## 2021-12-08 NOTE — Patient Instructions (Signed)
You may continue using your Xopenex (nebulizer) as you are doing.  Your lungs sounded clear today.  We will see you in follow-up in 4 to 6 months time call sooner any new problems arise.

## 2021-12-08 NOTE — Progress Notes (Signed)
Subjective:    Patient ID: Zadie Rhine, female    DOB: [birth year], 88 y.o.   MRN: [id number] Patient Care Team: Sherlene Shams, MD as PCP - General (Internal Medicine) Sherlene Shams, MD (Internal Medicine) Rickard Patience, MD as Consulting Physician (Hematology and Oncology)  Chief Complaint  Patient presents with   Follow-up    Occasional SOB when walking or after she eats. No wheezing or cough.    HPI Nari is an 88 year old lifelong never smoker who follows here due to shortness of breath.  Recall that she was admitted to [facility] on 15 February 2021 through 20 February 2021 with a complaint of shortness of breath.  She had been at [facility] previously for pneumonia from 20 December through 10 February 2021.  I evaluated her during her January hospitalization and it was noted that she was having sudden onset of breathlessness that occurred mostly postprandially. Her findings on imaging were consistent with aspiration events.  She was started on PPI antireflux measures and this helped tremendously.  She subsequently stopped taking the Protonix and after that required upper endoscopy due to impacted food in the esophagus.  She was restarted on Protonix after that.  She has not had any more major episodes of breathlessness.  Only notices occasional breathlessness after she eats or while walking.  She has had no wheezing or cough.  She has an extensive history of paroxysmal atrial fibrillation and has been on amiodarone and Xarelto.  Left ventricular ejection fraction is 40 to 45% she has severe MR. She had a leaderless pacemaker placed on 16 November 2021 by Dr. Cassie Freer.   Review of Systems A 10 point review of systems was performed and it is as noted above otherwise negative.  Patient Active Problem List   Diagnosis Date Noted   Essential tremor 11/23/2021   Vertebral fracture, osteoporotic, sequela 02/23/2021   Depression with anxiety 02/02/2021   History of embolic stroke 12/08/2020   Inflamed  external hemorrhoid 12/08/2020   Insomnia due to anxiety and fear 12/08/2020   Long term (current) use of anticoagulants    Chronic HFrEF (heart failure with reduced ejection fraction) (HCC)    Malnutrition of moderate degree (HCC) 11/03/2020   Prolonged QT interval    ILD (interstitial lung disease) (HCC) 09/22/2020   Atrial flutter, paroxysmal (HCC) 09/10/2020   Tachyarrhythmia 09/08/2020   Sick sinus syndrome (HCC) 09/08/2020   Elevated serum free T4 level 09/08/2020   Purpura senilis (HCC) 08/28/2020   Hiatal hernia 08/09/2020   Acquired thrombophilia (HCC) 08/06/2020   Elevated troponin I level 05/21/2020   CVA (cerebral vascular accident) (HCC) 03/24/2020   History of COVID-19 03/03/2020   Bilateral carotid artery stenosis 12/23/2019   Schatzki's ring of distal esophagus 12/11/2019   Erosive gastritis 11/30/2019   Dysphagia 11/27/2019   Painful swallowing 11/27/2019   Gastroesophageal reflux disease 11/27/2019   Stage 3a chronic kidney disease (HCC) 04/11/2019   Essential hypertension 09/12/2018   Chronic systolic CHF (congestive heart failure) (HCC) 08/15/2018   Hypothyroidism due to acquired atrophy of thyroid 05/08/2018   Unintentional weight loss 05/08/2018   Myalgia due to statin 05/08/2018   Abdominal aortic atherosclerosis (HCC) 05/07/2018   Bradycardia 04/28/2018   Pleural effusion on left 02/27/2018   Moderate mitral stenosis 01/10/2018   Lumbar radiculitis 01/07/2018   B12 deficiency 07/26/2017   Atrial fibrillation status post cardioversion [facility]) 04/06/2016   Hospital discharge follow-up 03/10/2016   Vitamin D deficiency 04/08/2015   Cervical spine degeneration  12/13/2014   History of pulmonary embolism 12/02/2014   Insomnia 10/04/2014   Moderate tricuspid insufficiency 09/17/2014   Generalized anxiety disorder 03/28/2013   Mitral valve prolapse 03/28/2013   Routine adult health maintenance 03/23/2012   Fatigue 03/23/2012   Cough 04/17/2011   Screening  for breast cancer 11/29/2010   Macular degeneration, left eye 11/29/2010   Screening for colon cancer 11/29/2010   Hyperlipidemia LDL goal <70 11/29/2010   GERD (gastroesophageal reflux disease)    Social History   Tobacco Use   Smoking status: Never   Smokeless tobacco: Never   Tobacco comments:    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 , worked at [employer], [employer]  Substance Use Topics   Alcohol use: No   Allergies  Allergen Reactions   Statins     Severe myalgias   Current Meds  Medication Sig   acetaminophen (TYLENOL) 500 MG tablet Take 500 mg by mouth every 6 (six) hours as needed.   amiodarone (PACERONE) 200 MG tablet Take 0.5 tablets (100 mg total) by mouth 2 (two) times daily. Home med.   levalbuterol (XOPENEX) 0.63 MG/3ML nebulizer solution Take 3 mLs (0.63 mg total) by nebulization every 6 (six) hours as needed for wheezing or shortness of breath.   levothyroxine (SYNTHROID) 50 MCG tablet TAKE 1 TABLET BY MOUTH ONCE DAILY AT  6AM   mirtazapine (REMERON) 15 MG tablet TAKE 1 TABLET BY MOUTH AT BEDTIME   Multiple Vitamin (MULTIVITAMIN WITH MINERALS) TABS tablet Take 1 tablet by mouth daily.   potassium chloride SA (KLOR-CON M) 20 MEQ tablet Take 20 mEq by mouth every other day.   XARELTO 15 MG TABS tablet TAKE 1 TABLET EVERY DAY   Immunization History  Administered Date(s) Administered   Pneumococcal Conjugate-13 12/11/2014   Pneumococcal Polysaccharide-23 07/13/2016       Objective:   Physical Exam BP 120/78 (BP Location: Left Arm, Cuff Size: Normal)   Pulse (!) 59   Temp (!) 97.3 F (36.3 C)   Ht 5\' 3"  (1.6 m)   Wt 113 lb 6.4 oz (51.4 kg)   SpO2 98%   BMI 20.09 kg/m   SpO2: 98 % O2 Device: None (Room air)  GENERAL: Frail-appearing elderly woman, presents in transport chair.  No tachypnea.  Debilitated appearing.  HEAD: Normocephalic, atraumatic.  EYES: Pupils equal, round, reactive to light.  No scleral icterus.  MOUTH: Oral mucosa moist. NECK: Supple. No  thyromegaly. Trachea midline. No JVD.  No adenopathy. PULMONARY: Good air entry bilaterally.  No adventitious sounds. CARDIOVASCULAR: S1 and S2. Regular rate and rhythm.  2/6 systolic ejection murmur left sternal border ABDOMEN: Nondistended, soft, no hepatosplenomegaly.  No tenderness.  No masses. MUSCULOSKELETAL: No joint deformity, no clubbing, no edema.   NEUROLOGIC: No focal deficit, speech is fluent. SKIN: Intact,warm,dry. PSYCH: Mood and behavior normal.     Assessment & Plan:     ICD-10-CM   1. Shortness of breath  R06.02    At times postprandial, at times post exertion Patient feels Xopenex helps when she "panics" Unable to do PFTs (cannot duplicate maneuvers)    2. Chronic HFrEF (heart failure with reduced ejection fraction) (HCC)  I50.22    This issue adds complexity to her management Followed by cardiology Gavin Potters)    3. Sick sinus syndrome (HCC)  I49.5    Had symptomatic bradycardia Episodes of paroxysmal A-fib Status post leaderless pacer placement 10/3     Will see the patient in follow-up in 4 to 6 months time she is to  contact us prior to that time should any new difficulties arise.  Gailen Shelter, MD Advanced Bronchoscopy PCCM [facility]    *This note was dictated using voice recognition software/Dragon.  Despite best efforts to proofread, errors can occur which can change the meaning. Any transcriptional errors that result from this process are unintentional and may not be fully corrected at the time of dictation.

## 2021-12-15 ENCOUNTER — Other Ambulatory Visit: Payer: Self-pay | Admitting: Internal Medicine

## 2022-02-04 ENCOUNTER — Other Ambulatory Visit: Payer: Self-pay | Admitting: Internal Medicine

## 2022-02-12 ENCOUNTER — Other Ambulatory Visit: Payer: Self-pay | Admitting: Internal Medicine

## 2022-02-23 DIAGNOSIS — I5020 Unspecified systolic (congestive) heart failure: Secondary | ICD-10-CM | POA: Diagnosis not present

## 2022-03-04 ENCOUNTER — Other Ambulatory Visit: Payer: Self-pay | Admitting: Internal Medicine

## 2022-03-08 DIAGNOSIS — H353221 Exudative age-related macular degeneration, left eye, with active choroidal neovascularization: Secondary | ICD-10-CM | POA: Diagnosis not present

## 2022-03-08 DIAGNOSIS — Z961 Presence of intraocular lens: Secondary | ICD-10-CM | POA: Diagnosis not present

## 2022-03-08 DIAGNOSIS — I69398 Other sequelae of cerebral infarction: Secondary | ICD-10-CM | POA: Diagnosis not present

## 2022-03-08 DIAGNOSIS — H353212 Exudative age-related macular degeneration, right eye, with inactive choroidal neovascularization: Secondary | ICD-10-CM | POA: Diagnosis not present

## 2022-03-09 ENCOUNTER — Telehealth: Payer: Self-pay | Admitting: Internal Medicine

## 2022-03-09 NOTE — Telephone Encounter (Signed)
Pt need refill on furosemide sent to [facility]

## 2022-03-09 NOTE — Telephone Encounter (Signed)
LMTCB. Need to find out how pt is taking her furosemide before refilling.

## 2022-03-10 ENCOUNTER — Other Ambulatory Visit: Payer: Self-pay

## 2022-03-10 MED ORDER — FUROSEMIDE 20 MG PO TABS: 20.0000 mg | ORAL_TABLET | ORAL | 5 refills | Status: DC

## 2022-03-10 NOTE — Telephone Encounter (Signed)
Pt daughter called back returning [place] message. I read the note below to her, and as per pt daugther, pt takes Furosemide a pill every other day.

## 2022-03-10 NOTE — Telephone Encounter (Signed)
Rx updated and sent in

## 2022-03-25 ENCOUNTER — Ambulatory Visit (INDEPENDENT_AMBULATORY_CARE_PROVIDER_SITE_OTHER): Payer: Medicare HMO | Admitting: Internal Medicine

## 2022-03-25 ENCOUNTER — Encounter: Payer: Self-pay | Admitting: Internal Medicine

## 2022-03-25 VITALS — BP 130/70 | HR 64 | Temp 97.9°F | Resp 16 | Ht 63.0 in | Wt 117.2 lb

## 2022-03-25 DIAGNOSIS — L409 Psoriasis, unspecified: Secondary | ICD-10-CM | POA: Insufficient documentation

## 2022-03-25 DIAGNOSIS — H01139 Eczematous dermatitis of unspecified eye, unspecified eyelid: Secondary | ICD-10-CM

## 2022-03-25 MED ORDER — CLOBETASOL PROPIONATE 0.05 % EX SOLN: 1.0000 | Freq: Two times a day (BID) | CUTANEOUS | 0 refills | Status: DC

## 2022-03-25 MED ORDER — TRIAMCINOLONE ACETONIDE 0.025 % EX OINT: 1.0000 | TOPICAL_OINTMENT | Freq: Two times a day (BID) | CUTANEOUS | 0 refills | Status: DC

## 2022-03-25 NOTE — Assessment & Plan Note (Signed)
Cormax (verh high potency) scalp  apply bid.  Change shampoo to hypoallergenic

## 2022-03-25 NOTE — Assessment & Plan Note (Signed)
Rx triaminolone 0.025  apply bid , and  stop [place] of [employer]

## 2022-03-25 NOTE — Patient Instructions (Signed)
I am treating your rash as follows:  Use Cormax on the scalp area twice daily for 2 weeks.     Use the triamcinolone ointment  on the eyelids twice daily for 2 weeks  Stop using YOUR CURRENT Skin and or hair product s  Use only Cetaphil, Cerave or Eucerin as a moisturizer on your neck and shoulder   No more Oil of Olay on face.  Only hypoallergenic moisturizers ("for sensitive skin"?

## 2022-03-25 NOTE — Progress Notes (Signed)
Subjective:  Patient ID: Frances Kent, female    DOB: 09-07-1930  Age: 89 y.o. MRN: [id number]  CC: The primary encounter diagnosis was Eczema of eyelid, unspecified laterality. A diagnosis of Psoriasis of scalp was also pertinent to this visit.   HPI [name] presents for evaluation of skin irritation  Chief Complaint  Patient presents with   Rash    Back of neck x 2-3 months Right eye lid x 1 week   89 yr old female presents with pruritic rash that began on the nape of her neck several months ago. Silvery patches  on nape of neck started several months ago.  described as itching without discharge) for several month  right greater than left  and now has developed bumps on her right eyelid.     Has been using Oil of Olay moisturizer rejuvate *red jar) which has not changed  Changed shampoo to  one that thickens hair in November . Gets hair done every few week,  no dyes used or change in styling .  She has macular degeneration and receives injections,  last one in October.   Has not seen [facility] since skin CA removed 3-4 years ago.    Outpatient Medications Prior to Visit  Medication Sig Dispense Refill   acetaminophen (TYLENOL) 500 MG tablet Take 500 mg by mouth every 6 (six) hours as needed.     amiodarone (PACERONE) 200 MG tablet Take 0.5 tablets (100 mg total) by mouth 2 (two) times daily. Home med.     ferrous sulfate 325 (65 FE) MG EC tablet Take 1 tablet (325 mg total) by mouth 2 (two) times daily. 180 tablet 1   furosemide (LASIX) 20 MG tablet Take 1 tablet (20 mg total) by mouth every other day. 15 tablet 5   levalbuterol (XOPENEX) 0.63 MG/3ML nebulizer solution Take 3 mLs (0.63 mg total) by nebulization every 6 (six) hours as needed for wheezing or shortness of breath. 330 mL 0   levothyroxine (SYNTHROID) 50 MCG tablet TAKE 1 TABLET BY MOUTH ONCE DAILY AT  6AM 90 tablet 0   mirtazapine (REMERON) 15 MG tablet TAKE 1 TABLET BY MOUTH AT BEDTIME 90 tablet 1    Multiple Vitamin (MULTIVITAMIN WITH MINERALS) TABS tablet Take 1 tablet by mouth daily.     pantoprazole (PROTONIX) 20 MG tablet Take 1 tablet (20 mg total) by mouth 2 (two) times daily before a meal. 180 tablet 0   potassium chloride SA (KLOR-CON M) 20 MEQ tablet Take 20 mEq by mouth every other day.     XARELTO 15 MG TABS tablet TAKE 1 TABLET EVERY DAY 30 tablet 3   amiodarone (PACERONE) 200 MG tablet Take 50 mg by mouth daily. (Patient not taking: Reported on 12/08/2021)     No facility-administered medications prior to visit.    Review of Systems;  Patient denies headache, fevers, malaise, unintentional weight loss, skin rash, eye pain, sinus congestion and sinus pain, sore throat, dysphagia,  hemoptysis , cough, dyspnea, wheezing, chest pain, palpitations, orthopnea, edema, abdominal pain, nausea, melena, diarrhea, constipation, flank pain, dysuria, hematuria, urinary  Frequency, nocturia, numbness, tingling, seizures,  Focal weakness, Loss of consciousness,  Tremor, insomnia, depression, anxiety, and suicidal ideation.      Objective:  BP 130/70   Pulse 64   Temp 97.9 F (36.6 C)   Resp 16   Ht 5' 3"$  (1.6 m)   Wt 117 lb 3.2 oz (53.2 kg)   SpO2 98%  BMI 20.76 kg/m   BP Readings from Last 3 Encounters:  03/25/22 130/70  12/08/21 120/78  11/23/21 134/70    Wt Readings from Last 3 Encounters:  03/25/22 117 lb 3.2 oz (53.2 kg)  12/08/21 113 lb 6.4 oz (51.4 kg)  11/23/21 110 lb (49.9 kg)    Physical Exam Vitals reviewed.  Constitutional:      General: She is not in acute distress.    Appearance: Normal appearance. She is normal weight. She is not ill-appearing, toxic-appearing or diaphoretic.  HENT:     Head: Normocephalic.  Eyes:     General: No scleral icterus.       Right eye: No discharge.        Left eye: No discharge.     Conjunctiva/sclera: Conjunctivae normal.  Musculoskeletal:        General: Normal range of motion.  Skin:    General: Skin is warm and  dry.     Findings: Rash present. Rash is scaling.          Comments: Silvery scalling rash at nape of neck Bilateral eyelid swelling /erythema Papules on right upper eyelid   Neurological:     General: No focal deficit present.     Mental Status: She is alert and oriented to person, place, and time. Mental status is at baseline.  Psychiatric:        Mood and Affect: Mood normal.        Behavior: Behavior normal.        Thought Content: Thought content normal.        Judgment: Judgment normal.     Lab Results  Component Value Date   HGBA1C 5.8 (H) 11/02/2020   HGBA1C 6.1 (H) 10/16/2020    Lab Results  Component Value Date   CREATININE 1.08 11/23/2021   CREATININE 0.97 11/17/2021   CREATININE 1.06 (H) 03/10/2021    Lab Results  Component Value Date   WBC 5.1 11/23/2021   HGB 12.5 11/23/2021   HCT 38.0 11/23/2021   PLT 192.0 11/23/2021   GLUCOSE 94 11/23/2021   CHOL 257 (H) 11/23/2021   TRIG 104.0 11/23/2021   HDL 96.40 11/23/2021   LDLDIRECT 185.3 03/27/2013   LDLCALC 140 (H) 11/23/2021   ALT 20 11/23/2021   AST 22 11/23/2021   NA 142 11/23/2021   K 4.1 11/23/2021   CL 104 11/23/2021   CREATININE 1.08 11/23/2021   BUN 16 11/23/2021   CO2 28 11/23/2021   TSH 3.34 11/23/2021   INR 3.0 (H) 10/15/2020   HGBA1C 5.8 (H) 11/02/2020    EP PPM/ICD IMPLANT  Result Date: 11/16/2021 Successful Micra AV leadless pacemaker implantation    Assessment & Plan:  .Eczema of eyelid, unspecified laterality Assessment & Plan: Rx triaminolone 0.025  apply bid , and  stop olay of olay   Psoriasis of scalp Assessment & Plan: Cormax (verh high potency) scalp  apply bid.  Change shampoo to hypoallergenic    Other orders -     Triamcinolone Acetonide; Apply 1 Application topically 2 (two) times daily. To eyelids and area under eyes  Dispense: 30 g; Refill: 0 -     Clobetasol Propionate; Apply 1 Application topically 2 (two) times daily. To scalp  Dispense: 50 mL; Refill:  0     Follow-up: Return in about 2 weeks (around 04/08/2022) for dermatitis .   [name], MD

## 2022-03-26 ENCOUNTER — Encounter: Payer: Self-pay | Admitting: Pulmonary Disease

## 2022-03-26 NOTE — Progress Notes (Signed)
Subjective:    Patient ID: Frances Kent, female    DOB: 1930-12-11, 89 y.o.   MRN: [id number] Patient Care Team: [name], MD as PCP - General (Internal Medicine) [name], MD (Internal Medicine) Earlie Server, MD as Consulting Physician (Hematology and Oncology) Tyler Pita, MD as Consulting Physician (Pulmonary Disease)  HPI Frances Kent is an 89 year old lifelong never smoker who follows here due to shortness of breath.  Recall that she was admitted to [facility] on 15 February 2021 through 20 February 2021 with a complaint of shortness of breath.  She had been at [facility] previously for pneumonia from 20 December through 10 February 2021.  I evaluated her during her hospitalization and it was noted that she was having sudden onset of breathlessness that occurred mostly postprandially.  Her findings on imaging were consistent with aspiration events.  She was started on PPI antireflux measures and this helped tremendously.  She subsequently stopped taking the Protonix and after that required upper endoscopy due to impacted food in the esophagus.  She was restarted on Protonix after that.  She has not had any more episodes of breathlessness.    She had upper endoscopy on 10 March 2021 that showed she had impacted food in her esophagus, she had a 2 cm hiatal hernia present, she had an esophageal stricture, he also had esophageal plaques suggestive of candidiasis the stricture was not amenable to dilation.  That was not attempted.  He is considering a second opinion in this regard.   She has an extensive history of paroxysmal atrial fibrillation and has been on amiodarone and Xarelto.  Left ventricular ejection fraction is 40 to 45% she has severe MR.  She has had multiple hospitalizations over the last 4 months with issues related to her cardiac diagnoses.   Review of Systems A 10 point review of systems was performed and it is as noted above otherwise negative.  Patient Active Problem List    Diagnosis Date Noted   Eczema of eyelid 03/25/2022   Psoriasis of scalp 03/25/2022   Essential tremor 11/23/2021   Vertebral fracture, osteoporotic, sequela 02/23/2021   Depression with anxiety 02/02/2021   History of embolic stroke [id number]   Inflamed external hemorrhoid 12/08/2020   Insomnia due to anxiety and fear 12/08/2020   Long term (current) use of anticoagulants    Chronic HFrEF (heart failure with reduced ejection fraction) (HCC)    Malnutrition of moderate degree ([place]) 11/03/2020   Prolonged QT interval    ILD (interstitial lung disease) ([place]) 09/22/2020   Atrial flutter, paroxysmal ([place]) 09/10/2020   Tachyarrhythmia 09/08/2020   Sick sinus syndrome ([place]) 09/08/2020   Elevated serum free T4 level 09/08/2020   Purpura senilis ([place]) 08/28/2020   Hiatal hernia 08/09/2020   Acquired thrombophilia ([place]) 08/06/2020   Elevated troponin I level 05/21/2020   CVA (cerebral vascular accident) ([place]) 03/24/2020   History of COVID-19 03/03/2020   Bilateral carotid artery stenosis 12/23/2019   Schatzki's ring of distal esophagus 12/11/2019   Erosive gastritis 11/30/2019   Dysphagia 11/27/2019   Painful swallowing 11/27/2019   Gastroesophageal reflux disease 11/27/2019   Stage 3a chronic kidney disease ([place]) 04/11/2019   Essential hypertension [id number]   Chronic systolic CHF (congestive heart failure) ([place]) 08/15/2018   Hypothyroidism due to acquired atrophy of thyroid 05/08/2018   Unintentional weight loss 05/08/2018   Myalgia due to statin 05/08/2018   Abdominal aortic atherosclerosis ([place]) 05/07/2018   Bradycardia 04/28/2018   Pleural effusion on left  02/27/2018   Moderate mitral stenosis 01/10/2018   Lumbar radiculitis 01/07/2018   B12 deficiency 07/26/2017   Atrial fibrillation status post cardioversion [facility]) 04/06/2016   Hospital discharge follow-up 03/10/2016   Vitamin D deficiency 04/08/2015   Cervical spine degeneration 12/13/2014   History of pulmonary embolism  12/02/2014   Insomnia 10/04/2014   Moderate tricuspid insufficiency 09/17/2014   Generalized anxiety disorder 03/28/2013   Mitral valve prolapse 03/28/2013   Routine adult health maintenance 03/23/2012   Fatigue 03/23/2012   Cough 04/17/2011   Screening for breast cancer 11/29/2010   Macular degeneration, left eye 11/29/2010   Screening for colon cancer 11/29/2010   Hyperlipidemia LDL goal <70 11/29/2010   GERD (gastroesophageal reflux disease)    Social History   Tobacco Use   Smoking status: Never   Smokeless tobacco: Never   Tobacco comments:    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 , worked at [employer], [employer]  Substance Use Topics   Alcohol use: No   Allergies  Allergen Reactions   Statins     Severe myalgias   Current Meds  Medication Sig   acetaminophen (TYLENOL) 500 MG tablet Take 500 mg by mouth every 6 (six) hours as needed.   amiodarone (PACERONE) 200 MG tablet Take 0.5 tablets (100 mg total) by mouth 2 (two) times daily. Home med.   ferrous sulfate 325 (65 FE) MG EC tablet Take 1 tablet (325 mg total) by mouth 2 (two) times daily.   pantoprazole (PROTONIX) 20 MG tablet Take 1 tablet (20 mg total) by mouth 2 (two) times daily before a meal.   ALPRAZolam (XANAX) 0.25 MG tablet Take 0.5 tablets (0.125 mg total) by mouth 2 (two) times daily as needed for anxiety.   budesonide (PULMICORT) 0.25 MG/2ML nebulizer solution Take 2 mLs (0.25 mg total) by nebulization 2 (two) times daily.   furosemide (LASIX) 20 MG tablet Take 2 tablets (40 mg total) by mouth daily.    levalbuterol (XOPENEX) 0.63 MG/3ML nebulizer solution Take 3 mLs (0.63 mg total) by nebulization 2 (two) times daily.   levothyroxine (EUTHYROX) 50 MCG tablet Take 1 tablet (50 mcg total) by mouth daily at 6 (six) AM.   mirtazapine (REMERON) 7.5 MG tablet Take 1 tablet (7.5 mg total) by mouth at bedtime.   Rivaroxaban (XARELTO) 15 MG TABS tablet Take 1 tablet (15 mg total) by mouth daily with supper.   [DISCONTINUED]  senna-docusate (SENOKOT-S) 8.6-50 MG tablet Take 1 tablet by mouth 2 (two) times daily between meals as needed for mild constipation. (Patient not taking: Reported on 11/16/2021)   Immunization History  Administered Date(s) Administered   Pneumococcal Conjugate-13 12/11/2014   Pneumococcal Polysaccharide-23 07/13/2016       Objective:   Physical Exam BP 110/80 (BP Location: Left Arm, Patient Position: Sitting, Cuff Size: Normal)   Pulse (!) 50   Temp (!) 97.1 F (36.2 C) (Oral)   Ht 5' 3"$  (1.6 m)   Wt 105 lb (47.6 kg)   SpO2 97%   BMI 18.60 kg/m   SpO2: 97 % O2 Device: None (Room air)  GENERAL: Frail-appearing elderly woman, presents in transport chair.  No tachypnea.  Sallow appearance.  Looks uncomfortable. HEAD: Normocephalic, atraumatic.  EYES: Pupils equal, round, reactive to light.  No scleral icterus.  MOUTH: Oral mucosa moist. NECK: Supple. No thyromegaly. Trachea midline. No JVD.  No adenopathy. PULMONARY: Good air entry bilaterally.  No adventitious sounds. CARDIOVASCULAR: S1 and S2. Regular rate and rhythm.  2/6 systolic ejection murmur left sternal border  ABDOMEN: Nondistended, soft, no hepatosplenomegaly.  No tenderness.  No masses. MUSCULOSKELETAL: No joint deformity, no clubbing, no edema.  Vertebral tenderness mid thoracic to lower thoracic mostly on the right. NEUROLOGIC: No focal deficit, speech is fluent. SKIN: Intact,warm,dry. PSYCH: Mood uncomfortable, behavior normal.       Assessment & Plan:     ICD-10-CM   1. Dyspnea, unspecified type  R06.00    Episodes related to eating Likely chronic silent aspiration Better on Protonix/antireflux measures    2. Silent aspiration, sequela  T17.900S    Recurrent aspiration Esophageal dysmotility Esophageal stricture    3. Chronic GERD  K21.9    Antireflux measures Protonix was resumed    4. Chronic systolic CHF (congestive heart failure) (HCC)  I50.22    This issue adds complexity to her  management Adds to her sensation of dyspnea     Will see the patient in follow-up in 2 months time she is to contact us prior to that time should any new difficulties arise.  Renold Don, MD Advanced Bronchoscopy PCCM [facility]    *This note was dictated using voice recognition software/Dragon.  Despite best efforts to proofread, errors can occur which can change the meaning. Any transcriptional errors that result from this process are unintentional and may not be fully corrected at the time of dictation.

## 2022-05-04 ENCOUNTER — Other Ambulatory Visit: Payer: Self-pay | Admitting: Internal Medicine

## 2022-05-06 ENCOUNTER — Other Ambulatory Visit: Payer: Self-pay | Admitting: Family

## 2022-05-17 DIAGNOSIS — H353212 Exudative age-related macular degeneration, right eye, with inactive choroidal neovascularization: Secondary | ICD-10-CM | POA: Diagnosis not present

## 2022-05-19 ENCOUNTER — Other Ambulatory Visit: Payer: Self-pay | Admitting: Pulmonary Disease

## 2022-05-24 ENCOUNTER — Other Ambulatory Visit: Payer: Self-pay

## 2022-05-24 MED ORDER — LEVOTHYROXINE SODIUM 50 MCG PO TABS: 50.0000 ug | ORAL_TABLET | Freq: Every day | ORAL | 1 refills | Status: DC

## 2022-05-25 ENCOUNTER — Ambulatory Visit: Payer: Medicare HMO | Admitting: Internal Medicine

## 2022-05-25 DIAGNOSIS — I341 Nonrheumatic mitral (valve) prolapse: Secondary | ICD-10-CM | POA: Diagnosis not present

## 2022-05-25 DIAGNOSIS — I2699 Other pulmonary embolism without acute cor pulmonale: Secondary | ICD-10-CM | POA: Diagnosis not present

## 2022-05-25 DIAGNOSIS — I2692 Saddle embolus of pulmonary artery without acute cor pulmonale: Secondary | ICD-10-CM | POA: Diagnosis not present

## 2022-05-25 DIAGNOSIS — I5020 Unspecified systolic (congestive) heart failure: Secondary | ICD-10-CM | POA: Diagnosis not present

## 2022-05-25 DIAGNOSIS — I4819 Other persistent atrial fibrillation: Secondary | ICD-10-CM | POA: Diagnosis not present

## 2022-05-25 DIAGNOSIS — I071 Rheumatic tricuspid insufficiency: Secondary | ICD-10-CM | POA: Diagnosis not present

## 2022-05-25 DIAGNOSIS — I4892 Unspecified atrial flutter: Secondary | ICD-10-CM | POA: Diagnosis not present

## 2022-05-25 DIAGNOSIS — R001 Bradycardia, unspecified: Secondary | ICD-10-CM | POA: Diagnosis not present

## 2022-05-25 DIAGNOSIS — I480 Paroxysmal atrial fibrillation: Secondary | ICD-10-CM | POA: Diagnosis not present

## 2022-05-27 ENCOUNTER — Other Ambulatory Visit: Payer: Self-pay | Admitting: Pulmonary Disease

## 2022-06-01 ENCOUNTER — Encounter: Payer: Self-pay | Admitting: Internal Medicine

## 2022-06-01 ENCOUNTER — Ambulatory Visit (INDEPENDENT_AMBULATORY_CARE_PROVIDER_SITE_OTHER): Payer: Medicare HMO | Admitting: Internal Medicine

## 2022-06-01 VITALS — BP 132/88 | HR 97 | Temp 97.8°F | Ht 63.0 in | Wt 122.4 lb

## 2022-06-01 DIAGNOSIS — R300 Dysuria: Secondary | ICD-10-CM

## 2022-06-01 DIAGNOSIS — G250 Essential tremor: Secondary | ICD-10-CM

## 2022-06-01 DIAGNOSIS — R5383 Other fatigue: Secondary | ICD-10-CM | POA: Diagnosis not present

## 2022-06-01 DIAGNOSIS — R634 Abnormal weight loss: Secondary | ICD-10-CM | POA: Diagnosis not present

## 2022-06-01 DIAGNOSIS — M8008XS Age-related osteoporosis with current pathological fracture, vertebra(e), sequela: Secondary | ICD-10-CM | POA: Diagnosis not present

## 2022-06-01 MED ORDER — AMIODARONE HCL 200 MG PO TABS: 100.0000 mg | ORAL_TABLET | Freq: Every day | ORAL | Status: DC

## 2022-06-01 MED ORDER — MIRTAZAPINE 15 MG PO TABS
22.5000 mg | ORAL_TABLET | Freq: Every day | ORAL | 0 refills | Status: DC
Start: 2022-06-01 — End: 2022-06-15

## 2022-06-01 NOTE — Assessment & Plan Note (Signed)
Multifactorial with  previous contribution by bradycardia,  resolved s/p pacemaker.  Will manage pain more aggressively and improve her sleep. Increase mirtazipine to 22.5 mg qhs   Lab Results  Component Value Date   TSH 3.34 11/23/2021

## 2022-06-01 NOTE — Progress Notes (Signed)
Subjective:  Patient ID: Frances Kent, female    DOB: 04/10/1930  Age: 89 y.o. MRN: [id number]  CC: The primary encounter diagnosis was Dysuria. Diagnoses of Essential tremor, Other fatigue, Vertebral fracture, osteoporotic, sequela, and Unintentional weight loss were also pertinent to this visit.   HPI ELLAJANE STONG presents for  Chief Complaint  Patient presents with   Medical Management of Chronic Issues   1) FATIGUE: more tired than usual.  She has multiple reasons HFrEF 40%.  Atrial fib  bilateral PE  .,  not sleeping well,  chronicmpai   Last cardiology eval April with Paraschoes,  no change   2) decreased appetite:  improved with mirtazapine 15 mg .  Weight gain of 5 lbs since last visit   3)  OA of hands and back . Underusing tylenol. Back gives out . Better with a brace but needs to different   4) sleep issues :  initiation and frequent wakings. Can't get comfortable  5) urine sediment noticed a few days ago no flank pain or dysuria/   6) using furosemide every other day for HFrEF .  Discussed reduction of salt in diet.  Eats mac n cheese,  tomato soup, spaghettin (soft foods due to dysphagia )  Outpatient Medications Prior to Visit  Medication Sig Dispense Refill   acetaminophen (TYLENOL) 500 MG tablet Take 500 mg by mouth every 6 (six) hours as needed.     clobetasol (TEMOVATE) 0.05 % external solution Apply 1 Application topically 2 (two) times daily. To scalp 50 mL 0   furosemide (LASIX) 20 MG tablet Take 1 tablet (20 mg total) by mouth every other day. 15 tablet 5   levalbuterol (XOPENEX) 0.63 MG/3ML nebulizer solution USE 1 VIAL IN NEBULIZER EVERY 4 HOURS AS NEEDED FOR WHEEZING FOR SHORTNESS OF BREATH 75 mL 5   levothyroxine (SYNTHROID) 50 MCG tablet Take 1 tablet (50 mcg total) by mouth daily before breakfast. 90 tablet 1   Multiple Vitamin (MULTIVITAMIN WITH MINERALS) TABS tablet Take 1 tablet by mouth daily.     pantoprazole (PROTONIX) 20 MG tablet Take 1  tablet (20 mg total) by mouth 2 (two) times daily before a meal. 180 tablet 0   potassium chloride SA (KLOR-CON M) 20 MEQ tablet Take 20 mEq by mouth every other day.     triamcinolone (KENALOG) 0.025 % ointment Apply 1 Application topically 2 (two) times daily. To eyelids and area under eyes 30 g 0   XARELTO 15 MG TABS tablet TAKE 1 TABLET EVERY DAY 90 tablet 3   amiodarone (PACERONE) 200 MG tablet Take 0.5 tablets (100 mg total) by mouth 2 (two) times daily. Home med.     ferrous sulfate 325 (65 FE) MG EC tablet Take 1 tablet (325 mg total) by mouth 2 (two) times daily. 180 tablet 1   mirtazapine (REMERON) 15 MG tablet TAKE 1 TABLET BY MOUTH AT BEDTIME 90 tablet 1   No facility-administered medications prior to visit.    Review of Systems;  Patient denies headache, fevers, malaise, unintentional weight loss, skin rash, eye pain, sinus congestion and sinus pain, sore throat, dysphagia,  hemoptysis , cough, dyspnea, wheezing, chest pain, palpitations, orthopnea, edema, abdominal pain, nausea, melena, diarrhea, constipation, flank pain, dysuria, hematuria, urinary  Frequency, nocturia, numbness, tingling, seizures,  Focal weakness, Loss of consciousness,  Tremor, insomnia, depression, anxiety, and suicidal ideation.      Objective:  BP 132/88   Pulse 97   Temp 97.8 F (36.6  C) (Oral)   Ht 5\' 3"  (1.6 m)   Wt 122 lb 6.4 oz (55.5 kg)   SpO2 99%   BMI 21.68 kg/m   BP Readings from Last 3 Encounters:  06/01/22 132/88  03/25/22 130/70  12/08/21 120/78    Wt Readings from Last 3 Encounters:  06/01/22 122 lb 6.4 oz (55.5 kg)  03/25/22 117 lb 3.2 oz (53.2 kg)  12/08/21 113 lb 6.4 oz (51.4 kg)    Physical Exam Vitals reviewed.  Constitutional:      General: She is not in acute distress.    Appearance: Normal appearance. She is normal weight. She is not ill-appearing, toxic-appearing or diaphoretic.  HENT:     Head: Normocephalic.  Eyes:     General: No scleral icterus.        Right eye: No discharge.        Left eye: No discharge.     Conjunctiva/sclera: Conjunctivae normal.  Cardiovascular:     Rate and Rhythm: Normal rate and regular rhythm.     Heart sounds: Normal heart sounds.  Pulmonary:     Effort: Pulmonary effort is normal. No respiratory distress.     Breath sounds: Normal breath sounds.  Musculoskeletal:        General: Normal range of motion.  Skin:    General: Skin is warm and dry.  Neurological:     General: No focal deficit present.     Mental Status: She is alert and oriented to person, place, and time. Mental status is at baseline.  Psychiatric:        Mood and Affect: Mood normal.        Behavior: Behavior normal.        Thought Content: Thought content normal.        Judgment: Judgment normal.    Lab Results  Component Value Date   HGBA1C 5.8 (H) 11/02/2020   HGBA1C 6.1 (H) 10/16/2020    Lab Results  Component Value Date   CREATININE 1.08 11/23/2021   CREATININE 0.97 11/17/2021   CREATININE 1.06 (H) 03/10/2021    Lab Results  Component Value Date   WBC 5.1 11/23/2021   HGB 12.5 11/23/2021   HCT 38.0 11/23/2021   PLT 192.0 11/23/2021   GLUCOSE 94 11/23/2021   CHOL 257 (H) 11/23/2021   TRIG 104.0 11/23/2021   HDL 96.40 11/23/2021   LDLDIRECT 185.3 03/27/2013   LDLCALC 140 (H) 11/23/2021   ALT 20 11/23/2021   AST 22 11/23/2021   NA 142 11/23/2021   K 4.1 11/23/2021   CL 104 11/23/2021   CREATININE 1.08 11/23/2021   BUN 16 11/23/2021   CO2 28 11/23/2021   TSH 3.34 11/23/2021   INR 3.0 (H) 10/15/2020   HGBA1C 5.8 (H) 11/02/2020    EP PPM/ICD IMPLANT  Result Date: 11/16/2021 Successful Micra AV leadless pacemaker implantation    Assessment & Plan:  .Dysuria -     Urine Culture -     Urinalysis, Routine w reflex microscopic  Essential tremor -     Comprehensive metabolic panel  Other fatigue Assessment & Plan: Multifactorial with  previous contribution by bradycardia,  resolved s/p pacemaker.  Will  manage pain more aggressively and improve her sleep. Increase mirtazipine to 22.5 mg qhs   Lab Results  Component Value Date   TSH 3.34 11/23/2021     Orders: -     B12 and Folate Panel -     CBC with Differential/Platelet -  Brain natriuretic peptide  Vertebral fracture, osteoporotic, sequela Assessment & Plan: She has back pain and weakness since her fracture and has trouble donning the back brace she currently has due to arthritis in her hands .  DME order for alternative back brace ordered .  Advised to schedule tylenol 1000 mg bid for pain management   Orders: -     For home use only DME Other see comment  Unintentional weight loss Assessment & Plan: She has gained several lbs since starting remeron    Other orders -     Mirtazapine; Take 1.5 tablets (22.5 mg total) by mouth at bedtime.  Dispense: 135 tablet; Refill: 0 -     Amiodarone HCl; Take 0.5 tablets (100 mg total) by mouth daily. Home med.     I provided 30 minutes of face-to-face time during this encounter reviewing patient's last visit with me, patient's  most recent visit with cardiology,   recent surgical and non surgical procedures, previous  labs and imaging studies  and Echocardiograms, counseling on currently addressed issues,  and post visit ordering to diagnostics and therapeutics .   Follow-up: No follow-ups on file.   Sherlene Shams, MD

## 2022-06-01 NOTE — Patient Instructions (Addendum)
You are due for your Annual Medicare Wellness visit on 06/12/2022, please schedule this appointment at checkout.   I want you to take 1000 mg  of Tylenol every morning  and every evening   to treat your joint pain   You can drink ginger tea  or   ginger beer   Continue using furosemide every other day  Your heart failure requires that you watch your salt.     2 gram sodium = 2000 mg daily to prevent fluid retention   Try to find low sodium soups

## 2022-06-01 NOTE — Assessment & Plan Note (Addendum)
She has back pain and weakness since her fracture and has trouble donning the back brace she currently has due to arthritis in her hands .  DME order for alternative back brace ordered .  Advised to schedule tylenol 1000 mg bid for pain management

## 2022-06-01 NOTE — Assessment & Plan Note (Signed)
She has gained several lbs since starting remeron

## 2022-06-02 LAB — URINALYSIS, ROUTINE W REFLEX MICROSCOPIC
Bilirubin Urine: NEGATIVE
Hgb urine dipstick: NEGATIVE
Ketones, ur: NEGATIVE
Leukocytes,Ua: NEGATIVE
Nitrite: NEGATIVE
RBC / HPF: NONE SEEN (ref 0–?)
Specific Gravity, Urine: 1.025 (ref 1.000–1.030)
Total Protein, Urine: NEGATIVE
Urine Glucose: NEGATIVE
Urobilinogen, UA: 0.2 (ref 0.0–1.0)
pH: 6 (ref 5.0–8.0)

## 2022-06-02 LAB — CBC WITH DIFFERENTIAL/PLATELET
Basophils Absolute: 0.1 10*3/uL (ref 0.0–0.1)
Basophils Relative: 1.1 % (ref 0.0–3.0)
Eosinophils Absolute: 0.1 10*3/uL (ref 0.0–0.7)
Eosinophils Relative: 1.1 % (ref 0.0–5.0)
HCT: 40.8 % (ref 36.0–46.0)
Hemoglobin: 13.4 g/dL (ref 12.0–15.0)
Lymphocytes Relative: 28.2 % (ref 12.0–46.0)
Lymphs Abs: 1.5 10*3/uL (ref 0.7–4.0)
MCHC: 33 g/dL (ref 30.0–36.0)
MCV: 93.9 fl (ref 78.0–100.0)
Monocytes Absolute: 0.5 10*3/uL (ref 0.1–1.0)
Monocytes Relative: 8.7 % (ref 3.0–12.0)
Neutro Abs: 3.3 10*3/uL (ref 1.4–7.7)
Neutrophils Relative %: 60.9 % (ref 43.0–77.0)
Platelets: 234 10*3/uL (ref 150.0–400.0)
RBC: 4.34 Mil/uL (ref 3.87–5.11)
RDW: 15.6 % — ABNORMAL HIGH (ref 11.5–15.5)
WBC: 5.4 10*3/uL (ref 4.0–10.5)

## 2022-06-02 LAB — BRAIN NATRIURETIC PEPTIDE: Brain Natriuretic Peptide: 463 pg/mL — ABNORMAL HIGH (ref <100)

## 2022-06-02 LAB — URINE CULTURE
MICRO NUMBER:: 14837349
SPECIMEN QUALITY:: ADEQUATE

## 2022-06-02 LAB — COMPREHENSIVE METABOLIC PANEL
ALT: 16 U/L (ref 0–35)
AST: 19 U/L (ref 0–37)
Albumin: 3.9 g/dL (ref 3.5–5.2)
Alkaline Phosphatase: 84 U/L (ref 39–117)
BUN: 23 mg/dL (ref 6–23)
CO2: 26 mEq/L (ref 19–32)
Calcium: 9.1 mg/dL (ref 8.4–10.5)
Chloride: 107 mEq/L (ref 96–112)
Creatinine, Ser: 1.28 mg/dL — ABNORMAL HIGH (ref 0.40–1.20)
GFR: 37.08 mL/min — ABNORMAL LOW (ref >60.00)
Glucose, Bld: 79 mg/dL (ref 70–99)
Potassium: 4.5 mEq/L (ref 3.5–5.1)
Sodium: 141 mEq/L (ref 135–145)
Total Bilirubin: 0.4 mg/dL (ref 0.2–1.2)
Total Protein: 6.9 g/dL (ref 6.0–8.3)

## 2022-06-02 LAB — B12 AND FOLATE PANEL
Folate: >23.5 ng/mL (ref >5.9)
Vitamin B-12: >1500 pg/mL — ABNORMAL HIGH (ref 211–911)

## 2022-06-06 ENCOUNTER — Other Ambulatory Visit: Payer: Self-pay | Admitting: Family

## 2022-06-06 DIAGNOSIS — I10 Essential (primary) hypertension: Secondary | ICD-10-CM | POA: Diagnosis not present

## 2022-06-06 DIAGNOSIS — R001 Bradycardia, unspecified: Secondary | ICD-10-CM | POA: Diagnosis not present

## 2022-06-06 DIAGNOSIS — I071 Rheumatic tricuspid insufficiency: Secondary | ICD-10-CM | POA: Diagnosis not present

## 2022-06-06 DIAGNOSIS — I2699 Other pulmonary embolism without acute cor pulmonale: Secondary | ICD-10-CM | POA: Diagnosis not present

## 2022-06-06 DIAGNOSIS — I4892 Unspecified atrial flutter: Secondary | ICD-10-CM | POA: Diagnosis not present

## 2022-06-06 DIAGNOSIS — I480 Paroxysmal atrial fibrillation: Secondary | ICD-10-CM | POA: Diagnosis not present

## 2022-06-06 DIAGNOSIS — I272 Pulmonary hypertension, unspecified: Secondary | ICD-10-CM | POA: Diagnosis not present

## 2022-06-06 DIAGNOSIS — I4819 Other persistent atrial fibrillation: Secondary | ICD-10-CM | POA: Diagnosis not present

## 2022-06-06 DIAGNOSIS — I050 Rheumatic mitral stenosis: Secondary | ICD-10-CM | POA: Diagnosis not present

## 2022-06-09 DIAGNOSIS — I5023 Acute on chronic systolic (congestive) heart failure: Secondary | ICD-10-CM | POA: Diagnosis not present

## 2022-06-09 DIAGNOSIS — I2692 Saddle embolus of pulmonary artery without acute cor pulmonale: Secondary | ICD-10-CM | POA: Diagnosis not present

## 2022-06-09 DIAGNOSIS — I071 Rheumatic tricuspid insufficiency: Secondary | ICD-10-CM | POA: Diagnosis not present

## 2022-06-09 DIAGNOSIS — I341 Nonrheumatic mitral (valve) prolapse: Secondary | ICD-10-CM | POA: Diagnosis not present

## 2022-06-09 DIAGNOSIS — I4819 Other persistent atrial fibrillation: Secondary | ICD-10-CM | POA: Diagnosis not present

## 2022-06-09 DIAGNOSIS — I480 Paroxysmal atrial fibrillation: Secondary | ICD-10-CM | POA: Diagnosis not present

## 2022-06-09 DIAGNOSIS — I4892 Unspecified atrial flutter: Secondary | ICD-10-CM | POA: Diagnosis not present

## 2022-06-09 DIAGNOSIS — I10 Essential (primary) hypertension: Secondary | ICD-10-CM | POA: Diagnosis not present

## 2022-06-09 DIAGNOSIS — I272 Pulmonary hypertension, unspecified: Secondary | ICD-10-CM | POA: Diagnosis not present

## 2022-06-15 ENCOUNTER — Other Ambulatory Visit: Payer: Self-pay | Admitting: Internal Medicine

## 2022-06-17 ENCOUNTER — Encounter: Payer: Self-pay | Admitting: Primary Care

## 2022-06-17 ENCOUNTER — Ambulatory Visit: Payer: Medicare HMO | Admitting: Primary Care

## 2022-06-17 VITALS — BP 110/70 | HR 64 | Temp 97.1°F | Ht 63.0 in | Wt 123.0 lb

## 2022-06-17 DIAGNOSIS — R0600 Dyspnea, unspecified: Secondary | ICD-10-CM | POA: Diagnosis not present

## 2022-06-17 DIAGNOSIS — R059 Cough, unspecified: Secondary | ICD-10-CM | POA: Diagnosis not present

## 2022-06-17 NOTE — Progress Notes (Signed)
@Patient  ID: Frances Kent, female    DOB: March 30, 1930, 89 y.o.   MRN: [id number]  Chief Complaint  Patient presents with   Follow-up    Occasional SOB. No cough or wheezing.    Referring provider: Sherlene Shams, MD  HPI: 89 year old female, never smoked. PMH significant for afib, heart failure EF 40%, CVA, HTN, mitral valve prolapse, prolonged QT interval, ILD, pleural effusion on left, GERD, dysphagia, chronic kidney disease.  06/17/2022 Patient presents today for follow-up/shortness of breath. Seen by cardiology end of April for AFIB, started on diltiazem 60mg  three times a day. She is doing alright today breathing wise. She gets short winded occasionally. She uses levalbuterol twice a day and prn as needed for shrotness of breath. She is able to do some light housework, limited d/t back pain. Denies cough, chest pain, chest tightness, wheezing, chest pain, dizziness/lightheaded.    Allergies  Allergen Reactions   Statins     Severe myalgias    Immunization History  Administered Date(s) Administered   Pneumococcal Conjugate-13 12/11/2014   Pneumococcal Polysaccharide-23 07/13/2016    Past Medical History:  Diagnosis Date   Atrial fibrillation (HCC)    Benign breast cyst in female, left 10/07/2016   Candida esophagitis (HCC) 05/03/2021   Found on Mar 10 2021 EGD    CAP (community acquired pneumonia) 02/15/2021   Colon adenomas    GERD (gastroesophageal reflux disease)    HCAP (healthcare-associated pneumonia) 02/02/2021   Hypertension    Hypothyroidism    Macular degeneration    Mitral regurgitation    Pulmonary embolism (HCC) 02/2016   Severe sepsis (HCC) 02/02/2021   Stroke (HCC) 10/2020   SVT (supraventricular tachycardia)     Tobacco History: Social History   Tobacco Use  Smoking Status Never  Smokeless Tobacco Never  Tobacco Comments   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 , worked at [employer], [employer] given: Not Answered Tobacco comments: passive  exposure , worked at [employer], [employer]   Outpatient Medications Prior to Visit  Medication Sig Dispense Refill   acetaminophen (TYLENOL) 500 MG tablet Take 500 mg by mouth every 6 (six) hours as needed.     amiodarone (PACERONE) 200 MG tablet Take 0.5 tablets (100 mg total) by mouth daily. Home med.     clobetasol (TEMOVATE) 0.05 % external solution Apply 1 Application topically 2 (two) times daily. To scalp 50 mL 0   diltiazem (CARDIZEM) 60 MG tablet Take 60 mg by mouth 3 (three) times daily.     furosemide (LASIX) 20 MG tablet Take 1 tablet (20 mg total) by mouth every other day. 15 tablet 5   levalbuterol (XOPENEX) 0.63 MG/3ML nebulizer solution USE 1 VIAL IN NEBULIZER EVERY 4 HOURS AS NEEDED FOR WHEEZING FOR SHORTNESS OF BREATH 75 mL 5   levothyroxine (SYNTHROID) 50 MCG tablet Take 1 tablet (50 mcg total) by mouth daily before breakfast. 90 tablet 1   mirtazapine (REMERON) 15 MG tablet TAKE 1 TABLET BY MOUTH AT BEDTIME 90 tablet 0   Multiple Vitamin (MULTIVITAMIN WITH MINERALS) TABS tablet Take 1 tablet by mouth daily.     potassium chloride SA (KLOR-CON M) 20 MEQ tablet Take 20 mEq by mouth every other day.     triamcinolone (KENALOG) 0.025 % ointment Apply 1 Application topically 2 (two) times daily. To eyelids and area under eyes 30 g 0   XARELTO 15 MG TABS tablet TAKE 1 TABLET EVERY DAY 90 tablet 3   pantoprazole (PROTONIX) 20  MG tablet Take 1 tablet (20 mg total) by mouth 2 (two) times daily before a meal. 180 tablet 0   No facility-administered medications prior to visit.   Review of Systems  Review of Systems  Constitutional: Negative.   HENT: Negative.    Respiratory:  Negative for cough, chest tightness and wheezing.        Intermittent shortness of breath  Cardiovascular: Negative.    Physical Exam  BP 110/70 (BP Location: Left Arm, Cuff Size: Normal)   Pulse 64   Temp (!) 97.1 F (36.2 C)   Ht 5\' 3"  (1.6 m)   Wt 123 lb (55.8 kg)   SpO2 97%   BMI 21.79 kg/m   Physical Exam Constitutional:      Appearance: Normal appearance. She is normal weight.  HENT:     Head: Normocephalic and atraumatic.     Mouth/Throat:     Mouth: Mucous membranes are moist.     Pharynx: Oropharynx is clear.  Cardiovascular:     Rate and Rhythm: Normal rate. Rhythm irregular.     Comments: No edema Pulmonary:     Effort: Pulmonary effort is normal.     Breath sounds: No wheezing, rhonchi or rales.  Musculoskeletal:        General: Normal range of motion.  Skin:    General: Skin is warm and dry.  Neurological:     General: No focal deficit present.     Mental Status: She is alert and oriented to person, place, and time. Mental status is at baseline.  Psychiatric:        Mood and Affect: Mood normal.        Behavior: Behavior normal.        Thought Content: Thought content normal.        Judgment: Judgment normal.      Lab Results:  CBC    Component Value Date/Time   WBC 5.4 06/01/2022 1522   RBC 4.34 06/01/2022 1522   HGB 13.4 06/01/2022 1522   HGB 14.3 03/22/2014 1229   HCT 40.8 06/01/2022 1522   HCT 44.0 03/22/2014 1229   PLT 234.0 06/01/2022 1522   PLT 237 03/22/2014 1229   MCV 93.9 06/01/2022 1522   MCV 91 03/22/2014 1229   MCH 27.5 03/10/2021 1056   MCHC 33.0 06/01/2022 1522   RDW 15.6 (H) 06/01/2022 1522   RDW 14.8 (H) 03/22/2014 1229   LYMPHSABS 1.5 06/01/2022 1522   LYMPHSABS 2.0 02/06/2013 1122   MONOABS 0.5 06/01/2022 1522   MONOABS 0.5 02/06/2013 1122   EOSABS 0.1 06/01/2022 1522   EOSABS 0.1 02/06/2013 1122   BASOSABS 0.1 06/01/2022 1522   BASOSABS 0.1 02/06/2013 1122    BMET    Component Value Date/Time   NA 141 06/01/2022 1522   NA 139 03/22/2014 1229   K 4.5 06/01/2022 1522   K 4.2 03/22/2014 1229   CL 107 06/01/2022 1522   CL 107 03/22/2014 1229   CO2 26 06/01/2022 1522   CO2 25 03/22/2014 1229   GLUCOSE 79 06/01/2022 1522   GLUCOSE 90 03/22/2014 1229   BUN 23 06/01/2022 1522   BUN 16 03/22/2014 1229    CREATININE 1.28 (H) 06/01/2022 1522   CREATININE 0.90 03/22/2014 1229   CALCIUM 9.1 06/01/2022 1522   CALCIUM 8.3 (L) 03/22/2014 1229   GFRNONAA 56 (L) 11/17/2021 0515   GFRNONAA >60 03/22/2014 1229   GFRNONAA >60 02/06/2013 1122   GFRAA 51 (L) 08/17/2018 0544   GFRAA >  60 03/22/2014 1229   GFRAA >60 02/06/2013 1122    BNP    Component Value Date/Time   BNP 463 (H) 06/01/2022 1522    ProBNP No results found for: "PROBNP"  Imaging: No results found.   Assessment & Plan:   Dyspnea - Stable; Very occasional dyspnea. No associated respiratory complaints.  - Using Levalbuterol twice daily without breakthrough symptoms    Recommendations: - Continue cardiac medications as directed - Use Levalbuterol twice daily and as needed every 6 hours for shortness of breath/wheezing  - Notify office if you develop a cough or sputum production   Follow-up: - 3-4 months with Dr. Jayme Cloud or sooner if needed   Glenford Bayley, NP 06/20/2022

## 2022-06-17 NOTE — Patient Instructions (Addendum)
Recommendations: - Continue cardiac medications as directed - Use Levalbuterol twice daily and as needed every 6 hours for shortness of breath/wheezing  - Notify office if you develop a cough or sputum production   Follow-up: - 3-4 months with Dr. Jayme Cloud or sooner if needed

## 2022-06-20 DIAGNOSIS — I4892 Unspecified atrial flutter: Secondary | ICD-10-CM | POA: Diagnosis not present

## 2022-06-20 NOTE — Assessment & Plan Note (Addendum)
-   Stable; Very occasional dyspnea. No associated respiratory complaints.  - Using Levalbuterol twice daily without breakthrough symptoms

## 2022-06-20 NOTE — Assessment & Plan Note (Deleted)
-   Stable; No acute complaints

## 2022-06-27 ENCOUNTER — Ambulatory Visit (INDEPENDENT_AMBULATORY_CARE_PROVIDER_SITE_OTHER): Payer: Medicare HMO | Admitting: Family Medicine

## 2022-06-27 ENCOUNTER — Ambulatory Visit
Admission: RE | Admit: 2022-06-27 | Discharge: 2022-06-27 | Disposition: A | Discharge status: Home / Self Care | Payer: Medicare HMO | Attending: Family Medicine | Admitting: Family Medicine

## 2022-06-27 ENCOUNTER — Encounter: Payer: Self-pay | Admitting: Family Medicine

## 2022-06-27 ENCOUNTER — Ambulatory Visit
Admission: RE | Admit: 2022-06-27 | Discharge: 2022-06-27 | Disposition: A | Discharge status: Home / Self Care | Payer: Medicare HMO | Source: Ambulatory Visit | Attending: Family Medicine | Admitting: Family Medicine

## 2022-06-27 ENCOUNTER — Other Ambulatory Visit: Payer: Self-pay | Admitting: Family Medicine

## 2022-06-27 VITALS — BP 98/68 | HR 105 | Temp 98.5°F | Ht 63.0 in | Wt 121.0 lb

## 2022-06-27 DIAGNOSIS — R051 Acute cough: Secondary | ICD-10-CM | POA: Diagnosis not present

## 2022-06-27 DIAGNOSIS — R059 Cough, unspecified: Secondary | ICD-10-CM | POA: Diagnosis not present

## 2022-06-27 LAB — POC COVID19 BINAXNOW: SARS Coronavirus 2 Ag: NEGATIVE

## 2022-06-27 MED ORDER — DOXYCYCLINE HYCLATE 100 MG PO TABS
100.0000 mg | ORAL_TABLET | Freq: Two times a day (BID) | ORAL | 0 refills | Status: DC
Start: 2022-06-27 — End: 2022-06-28

## 2022-06-27 MED ORDER — BENZONATATE 200 MG PO CAPS
200.0000 mg | ORAL_CAPSULE | Freq: Two times a day (BID) | ORAL | 0 refills | Status: DC | PRN
Start: 2022-06-27 — End: 2022-07-06

## 2022-06-27 MED ORDER — AMOXICILLIN-POT CLAVULANATE 875-125 MG PO TABS: 1.0000 | ORAL_TABLET | Freq: Two times a day (BID) | ORAL | 0 refills | Status: DC

## 2022-06-27 NOTE — Progress Notes (Signed)
Marikay Alar, MD Phone: [phone]  Frances Kent is a 89 y.o. female who presents today for same day visit.   Respiratory illness: Patient and her daughter note this started 2 days ago.  She has cough that is not productive.  No chest or sinus congestion.  No fever.  She notes minimal postnasal drip.  No sore throat.  No ear pain.  No taste or smell disturbances.  No known COVID exposure.  She notes some shortness of breath though it is not much worse than usual.  She feels quite weak and achy.  She was around sick may be recently.  She has been using her Xopenex without much benefit for her cough.  Social History   Tobacco Use  Smoking Status Never  Smokeless Tobacco Never  Tobacco Comments   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 , worked at [employer], [employer]    Current Outpatient Medications on File Prior to Visit  Medication Sig Dispense Refill   acetaminophen (TYLENOL) 500 MG tablet Take 500 mg by mouth every 6 (six) hours as needed.     amiodarone (PACERONE) 200 MG tablet Take 0.5 tablets (100 mg total) by mouth daily. Home med.     clobetasol (TEMOVATE) 0.05 % external solution Apply 1 Application topically 2 (two) times daily. To scalp 50 mL 0   diltiazem (CARDIZEM) 60 MG tablet Take 60 mg by mouth 3 (three) times daily.     furosemide (LASIX) 20 MG tablet Take 1 tablet (20 mg total) by mouth every other day. 15 tablet 5   levalbuterol (XOPENEX) 0.63 MG/3ML nebulizer solution USE 1 VIAL IN NEBULIZER EVERY 4 HOURS AS NEEDED FOR WHEEZING FOR SHORTNESS OF BREATH 75 mL 5   levothyroxine (SYNTHROID) 50 MCG tablet Take 1 tablet (50 mcg total) by mouth daily before breakfast. 90 tablet 1   mirtazapine (REMERON) 15 MG tablet TAKE 1 TABLET BY MOUTH AT BEDTIME 90 tablet 0   Multiple Vitamin (MULTIVITAMIN WITH MINERALS) TABS tablet Take 1 tablet by mouth daily.     potassium chloride SA (KLOR-CON M) 20 MEQ tablet Take 20 mEq by mouth every other day.     triamcinolone (KENALOG) 0.025 % ointment  Apply 1 Application topically 2 (two) times daily. To eyelids and area under eyes 30 g 0   XARELTO 15 MG TABS tablet TAKE 1 TABLET EVERY DAY 90 tablet 3   pantoprazole (PROTONIX) 20 MG tablet Take 1 tablet (20 mg total) by mouth 2 (two) times daily before a meal. 180 tablet 0   No current facility-administered medications on file prior to visit.     ROS see history of present illness  Objective  Physical Exam Vitals:   06/27/22 1510  BP: 98/68  Pulse: (!) 105  Temp: 98.5 F (36.9 C)  SpO2: 95%    BP Readings from Last 3 Encounters:  06/27/22 98/68  06/17/22 110/70  06/01/22 132/88   Wt Readings from Last 3 Encounters:  06/27/22 121 lb (54.9 kg)  06/17/22 123 lb (55.8 kg)  06/01/22 122 lb 6.4 oz (55.5 kg)    Physical Exam Constitutional:      General: She is not in acute distress.    Appearance: She is not diaphoretic.  Cardiovascular:     Rate and Rhythm: Tachycardia present. Rhythm irregularly irregular.     Heart sounds: Normal heart sounds.  Pulmonary:     Effort: Pulmonary effort is normal.     Breath sounds: No wheezing.     Comments: Coarse breath sounds  throughout. Skin:    General: Skin is warm and dry.  Neurological:     Mental Status: She is alert.      Assessment/Plan: Please see individual problem list.  Acute cough Assessment & Plan: Patient with new onset cough.  Discussed concern for possible pneumonia given how she feels.  She also could have bronchitis.  Less likely related to fluid retention from her heart failure given her lung exam.  COVID test is negative.  We will check a chest x-ray and lab work.  Treating with doxycycline 100 mg twice daily.  Discussed risk of diarrhea with this medication.  Discussed risk of skin sensitivity to the sun with this medication.  Tessalon sent in for cough.  Advised to seek medical attention if she has worsening shortness of breath, cough productive of blood, fevers, or if she starts to feel significantly  worse.  She will see cardiology tomorrow for her atrial fibrillation.  Orders: -     Doxycycline Hyclate; Take 1 tablet (100 mg total) by mouth 2 (two) times daily.  Dispense: 14 tablet; Refill: 0 -     Comprehensive metabolic panel -     CBC with Differential/Platelet -     Brain natriuretic peptide -     Benzonatate; Take 1 capsule (200 mg total) by mouth 2 (two) times daily as needed for cough.  Dispense: 20 capsule; Refill: 0 -     POC COVID-19 BinaxNow -     DG Chest 2 View; Future  CXR ordered stat.    Return if symptoms worsen or fail to improve.   Marikay Alar, MD [facility]

## 2022-06-27 NOTE — Assessment & Plan Note (Signed)
Patient with new onset cough.  Discussed concern for possible pneumonia given how she feels.  She also could have bronchitis.  Less likely related to fluid retention from her heart failure given her lung exam.  COVID test is negative.  We will check a chest x-ray and lab work.  Treating with doxycycline 100 mg twice daily.  Discussed risk of diarrhea with this medication.  Discussed risk of skin sensitivity to the sun with this medication.  Tessalon sent in for cough.  Advised to seek medical attention if she has worsening shortness of breath, cough productive of blood, fevers, or if she starts to feel significantly worse.  She will see cardiology tomorrow for her atrial fibrillation.

## 2022-06-27 NOTE — Patient Instructions (Signed)
Nice to see you. We will have you get a chest x-ray today.  We will also get lab work and contact you with the results. I am starting on doxycycline.  There is risk of diarrhea with any antibiotic.  There is also risk of skin sensitivity to the sun with the doxycycline.  If you go outside while on this please cover up to limit sun exposure. If you feel worse, develop fever of 103 F or higher, start coughing up blood, or have worsening shortness of breath please seek medical attention immediately.

## 2022-06-28 ENCOUNTER — Telehealth: Payer: Self-pay | Admitting: Internal Medicine

## 2022-06-28 LAB — BRAIN NATRIURETIC PEPTIDE: Brain Natriuretic Peptide: 397 pg/mL — ABNORMAL HIGH (ref <100)

## 2022-06-28 LAB — CBC WITH DIFFERENTIAL/PLATELET
Basophils Absolute: 0.1 10*3/uL (ref 0.0–0.1)
Basophils Relative: 1.1 % (ref 0.0–3.0)
Eosinophils Absolute: 0 10*3/uL (ref 0.0–0.7)
Eosinophils Relative: 0 % (ref 0.0–5.0)
HCT: 38.5 % (ref 36.0–46.0)
Hemoglobin: 12.9 g/dL (ref 12.0–15.0)
Lymphocytes Relative: 8.2 % — ABNORMAL LOW (ref 12.0–46.0)
Lymphs Abs: 0.5 10*3/uL — ABNORMAL LOW (ref 0.7–4.0)
MCHC: 33.5 g/dL (ref 30.0–36.0)
MCV: 93.4 fl (ref 78.0–100.0)
Monocytes Absolute: 0.5 10*3/uL (ref 0.1–1.0)
Monocytes Relative: 7.5 % (ref 3.0–12.0)
Neutro Abs: 5.6 10*3/uL (ref 1.4–7.7)
Neutrophils Relative %: 83.2 % — ABNORMAL HIGH (ref 43.0–77.0)
Platelets: 194 10*3/uL (ref 150.0–400.0)
RBC: 4.12 Mil/uL (ref 3.87–5.11)
RDW: 15.4 % (ref 11.5–15.5)
WBC: 6.7 10*3/uL (ref 4.0–10.5)

## 2022-06-28 LAB — COMPREHENSIVE METABOLIC PANEL
ALT: 13 U/L (ref 0–35)
AST: 19 U/L (ref 0–37)
Albumin: 4.3 g/dL (ref 3.5–5.2)
Alkaline Phosphatase: 104 U/L (ref 39–117)
BUN: 17 mg/dL (ref 6–23)
CO2: 26 mEq/L (ref 19–32)
Calcium: 9.3 mg/dL (ref 8.4–10.5)
Chloride: 103 mEq/L (ref 96–112)
Creatinine, Ser: 1.35 mg/dL — ABNORMAL HIGH (ref 0.40–1.20)
GFR: 34.77 mL/min — ABNORMAL LOW (ref >60.00)
Glucose, Bld: 100 mg/dL — ABNORMAL HIGH (ref 70–99)
Potassium: 4.7 mEq/L (ref 3.5–5.1)
Sodium: 139 mEq/L (ref 135–145)
Total Bilirubin: 0.9 mg/dL (ref 0.2–1.2)
Total Protein: 7.1 g/dL (ref 6.0–8.3)

## 2022-06-28 MED ORDER — AZITHROMYCIN 250 MG PO TABS: ORAL_TABLET | ORAL | 0 refills | Status: AC

## 2022-06-28 NOTE — Telephone Encounter (Signed)
Noted  

## 2022-06-28 NOTE — Telephone Encounter (Signed)
Noted. Please let the patient know that I am going to send in azithromycin for her to start on and she can stop the doxycycline. We checked with the infectious disease pharmacist to make sure this was an ok option. She will continue on the augmentin. If she starts to have palpitations on the azithromycin she needs to be evaluated right away.

## 2022-06-28 NOTE — Telephone Encounter (Signed)
Spoke to [name] Elita Quick she states the Patient does not feel any better and did not rest well last night. [name] understands and is agreeable to stopping the Doxycycline and starting the Azithromycin along with continuing the Augmentin. Patient is scheduled with [name] for a follow up on 5/16 at 8:40.

## 2022-06-28 NOTE — Telephone Encounter (Signed)
I am going to send to our clinical pharmacist to see what the next best option is. Her amiodarone complicates this a bit.

## 2022-06-28 NOTE — Telephone Encounter (Signed)
Patient was seen by Dr Birdie Sons yesterday. He prescribed doxycycline (VIBRA-TABS) 100 MG tablet and is following the directions to eat with food. Medication is making her stomach hurt and feel sick to her stomach. Please call daughter Elita Quick at [phone].

## 2022-06-28 NOTE — Telephone Encounter (Signed)
Discussed with infectious disease colleague. No documented history of significantly prolonged Qtc. Little increase in Qtc expected with a short course of azithromycin, even in combination with amiodarone.   Can continue amoxicillin/clavulanate monotherapy or if atypical coverage is desired, can use azithromycin in place of doxycycline.

## 2022-06-29 DIAGNOSIS — I4891 Unspecified atrial fibrillation: Secondary | ICD-10-CM | POA: Diagnosis not present

## 2022-06-29 DIAGNOSIS — J9801 Acute bronchospasm: Secondary | ICD-10-CM | POA: Diagnosis not present

## 2022-06-29 DIAGNOSIS — I5020 Unspecified systolic (congestive) heart failure: Secondary | ICD-10-CM | POA: Diagnosis not present

## 2022-06-29 DIAGNOSIS — J189 Pneumonia, unspecified organism: Secondary | ICD-10-CM | POA: Diagnosis not present

## 2022-06-30 ENCOUNTER — Ambulatory Visit (INDEPENDENT_AMBULATORY_CARE_PROVIDER_SITE_OTHER): Payer: Medicare HMO | Admitting: Nurse Practitioner

## 2022-06-30 ENCOUNTER — Encounter: Payer: Self-pay | Admitting: Nurse Practitioner

## 2022-06-30 VITALS — BP 102/60 | HR 101 | Temp 98.6°F | Ht 63.0 in | Wt 120.2 lb

## 2022-06-30 DIAGNOSIS — J189 Pneumonia, unspecified organism: Secondary | ICD-10-CM | POA: Diagnosis not present

## 2022-06-30 NOTE — Assessment & Plan Note (Addendum)
Improvement today. Advised to continue Augmentin and Azithromycin as prescribed and complete entire course of medication. She can continue her breathing treatments 4 times daily. She has Benzonatate at home to use as needed for cough. Encouraged adequate fluid intake. She will follow up with her PCP in 6 days as previously scheduled. Strict precautions given to patient.

## 2022-06-30 NOTE — Progress Notes (Signed)
[name], NP-C Phone: [phone]  Frances Kent is a 89 y.o. female who presents today for follow up.  Patient was diagnosed with pneumonia on 06/27/2022 and started on Augmentin and Doxycycline. The Doxycyline made her feel sick to her stomach therefore she was switch to Azithromycin on 06/28/2022. She was seen yesterday at Urgent Care due to increased fatigue. She was advised to increase her Xopenex breathing treatments via nebulizer from twice daily to 4 times daily. She reports feeling better today. She continues to take the Augmentin and Azithromycin. She has increased her breathing treatments. She reports sleeping much better last night than she has in the past week. She feels like she has more energy today. She was able to eat breakfast this morning. She has been drinking fluids. She denies worsening shortness of breath. Denies chest pains. Denies fevers. She does have a productive cough.   Social History   Tobacco Use  Smoking Status Never  Smokeless Tobacco Never  Tobacco Comments   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 , worked at [employer], [employer]    Current Outpatient Medications on File Prior to Visit  Medication Sig Dispense Refill   amoxicillin-clavulanate (AUGMENTIN) 875-125 MG tablet Take 1 tablet by mouth 2 (two) times daily. 14 tablet 0   acetaminophen (TYLENOL) 500 MG tablet Take 500 mg by mouth every 6 (six) hours as needed.     amiodarone (PACERONE) 200 MG tablet Take 0.5 tablets (100 mg total) by mouth daily. Home med.     azithromycin (ZITHROMAX) 250 MG tablet Take 2 tablets on day 1, then 1 tablet daily on days 2 through 5 6 tablet 0   benzonatate (TESSALON) 200 MG capsule Take 1 capsule (200 mg total) by mouth 2 (two) times daily as needed for cough. 20 capsule 0   clobetasol (TEMOVATE) 0.05 % external solution Apply 1 Application topically 2 (two) times daily. To scalp 50 mL 0   diltiazem (CARDIZEM) 60 MG tablet Take 60 mg by mouth 3 (three) times daily.     furosemide  (LASIX) 20 MG tablet Take 1 tablet (20 mg total) by mouth every other day. 15 tablet 5   levalbuterol (XOPENEX) 0.63 MG/3ML nebulizer solution USE 1 VIAL IN NEBULIZER EVERY 4 HOURS AS NEEDED FOR WHEEZING FOR SHORTNESS OF BREATH 75 mL 5   levothyroxine (SYNTHROID) 50 MCG tablet Take 1 tablet (50 mcg total) by mouth daily before breakfast. 90 tablet 1   mirtazapine (REMERON) 15 MG tablet TAKE 1 TABLET BY MOUTH AT BEDTIME 90 tablet 0   Multiple Vitamin (MULTIVITAMIN WITH MINERALS) TABS tablet Take 1 tablet by mouth daily.     pantoprazole (PROTONIX) 20 MG tablet Take 1 tablet (20 mg total) by mouth 2 (two) times daily before a meal. 180 tablet 0   potassium chloride SA (KLOR-CON M) 20 MEQ tablet Take 20 mEq by mouth every other day.     triamcinolone (KENALOG) 0.025 % ointment Apply 1 Application topically 2 (two) times daily. To eyelids and area under eyes 30 g 0   XARELTO 15 MG TABS tablet TAKE 1 TABLET EVERY DAY 90 tablet 3   No current facility-administered medications on file prior to visit.     ROS see history of present illness  Objective  Physical Exam Vitals:   06/30/22 0843  BP: 102/60  Pulse: (!) 101  Temp: 98.6 F (37 C)  SpO2: 95%    BP Readings from Last 3 Encounters:  06/30/22 102/60  06/27/22 98/68  06/17/22 110/70  Wt Readings from Last 3 Encounters:  06/30/22 120 lb 3.2 oz (54.5 kg)  06/27/22 121 lb (54.9 kg)  06/17/22 123 lb (55.8 kg)    Physical Exam Constitutional:      General: She is not in acute distress.    Appearance: Normal appearance.  HENT:     Head: Normocephalic.  Cardiovascular:     Rate and Rhythm: Normal rate and regular rhythm.     Heart sounds: Normal heart sounds.  Pulmonary:     Effort: Pulmonary effort is normal.     Breath sounds: Rhonchi (global) present.  Skin:    General: Skin is warm and dry.  Neurological:     General: No focal deficit present.     Mental Status: She is alert.  Psychiatric:        Mood and Affect:  Mood normal.        Behavior: Behavior normal.    Assessment/Plan: Please see individual problem list.  Pneumonia of left upper lobe due to infectious organism Assessment & Plan: Improvement today. Advised to continue Augmentin and Azithromycin as prescribed and complete entire course of medication. She can continue her breathing treatments 4 times daily. She has Benzonatate at home to use as needed for cough. Encouraged adequate fluid intake. She will follow up with her PCP in 6 days as previously scheduled. Strict precautions given to patient.     Return in 6 days (on 07/06/2022) for Follow up with Dr. Darrick Huntsman, as previously scheduled.   [name], NP-C [facility] - [employer]

## 2022-07-06 ENCOUNTER — Encounter: Payer: Self-pay | Admitting: Internal Medicine

## 2022-07-06 ENCOUNTER — Ambulatory Visit (INDEPENDENT_AMBULATORY_CARE_PROVIDER_SITE_OTHER): Payer: Medicare HMO

## 2022-07-06 ENCOUNTER — Ambulatory Visit (INDEPENDENT_AMBULATORY_CARE_PROVIDER_SITE_OTHER): Payer: Medicare HMO | Admitting: Internal Medicine

## 2022-07-06 VITALS — BP 132/66 | HR 114 | Temp 98.2°F | Ht 63.0 in | Wt 119.8 lb

## 2022-07-06 DIAGNOSIS — R001 Bradycardia, unspecified: Secondary | ICD-10-CM | POA: Diagnosis not present

## 2022-07-06 DIAGNOSIS — J849 Interstitial pulmonary disease, unspecified: Secondary | ICD-10-CM

## 2022-07-06 DIAGNOSIS — E440 Moderate protein-calorie malnutrition: Secondary | ICD-10-CM | POA: Diagnosis not present

## 2022-07-06 DIAGNOSIS — I495 Sick sinus syndrome: Secondary | ICD-10-CM | POA: Diagnosis not present

## 2022-07-06 DIAGNOSIS — I4891 Unspecified atrial fibrillation: Secondary | ICD-10-CM

## 2022-07-06 DIAGNOSIS — J189 Pneumonia, unspecified organism: Secondary | ICD-10-CM

## 2022-07-06 DIAGNOSIS — R051 Acute cough: Secondary | ICD-10-CM | POA: Diagnosis not present

## 2022-07-06 DIAGNOSIS — Z950 Presence of cardiac pacemaker: Secondary | ICD-10-CM

## 2022-07-06 DIAGNOSIS — R0602 Shortness of breath: Secondary | ICD-10-CM | POA: Diagnosis not present

## 2022-07-06 DIAGNOSIS — R634 Abnormal weight loss: Secondary | ICD-10-CM

## 2022-07-06 DIAGNOSIS — R059 Cough, unspecified: Secondary | ICD-10-CM | POA: Diagnosis not present

## 2022-07-06 MED ORDER — BENZONATATE 200 MG PO CAPS
200.0000 mg | ORAL_CAPSULE | Freq: Two times a day (BID) | ORAL | 0 refills | Status: DC | PRN
Start: 2022-07-06 — End: 2022-11-22

## 2022-07-06 NOTE — Progress Notes (Signed)
Subjective:  Patient ID: Frances Kent, female    DOB: [birth year]  Age: 89 y.o. MRN: [id number]  CC: The primary encounter diagnosis was Community acquired pneumonia of left upper lobe of lung. Diagnoses of Acute cough, Sick sinus syndrome (HCC), Shortness of breath, Unintentional weight loss, Pneumonia of left upper lobe due to infectious organism, ILD (interstitial lung disease) (HCC), Malnutrition of moderate degree (HCC), Pacemaker, Atrial fibrillation status post cardioversion [facility]), and Bradycardia were also pertinent to this visit.   HPI SHARIAH FLORIDO presents for  Chief Complaint  Patient presents with   Medical Management of Chronic Issues    Follow up on pneumonia   Frances Kent is an 89 yr old female with chronic atrial fibrillation,  dysgeusia since her last COVID infection who returns today for follow up on Community acquired PNA.  She was Treated by ES on May 13  for LUL pneumonai with augmentin and doxycycline.  Abx regimen was changed by Urgent care  on May 13  due to doxy intolerance, to azithromycin and augmentin .  She was reevaluated  by [name] , NP on May 16 and was noted to be clinically improving with medication changes and increased  use of xopenex nebulizers.  .  However over the last several days  she reports a drop in appetite and an increase in pulse  despite cardiologist recently increasing her diltiazem from tid to qid.  " I feel terrible "  :  she endorses fatigue, dyspnea with activities,  but denies fevers, and  sputum production .  She is " making herself eat "  and    drinking a protein shake nearly once daily    Outpatient Medications Prior to Visit  Medication Sig Dispense Refill   acetaminophen (TYLENOL) 500 MG tablet Take 500 mg by mouth every 6 (six) hours as needed.     amiodarone (PACERONE) 200 MG tablet Take 0.5 tablets (100 mg total) by mouth daily. Home med.     clobetasol (TEMOVATE) 0.05 % external solution Apply 1 Application topically 2  (two) times daily. To scalp 50 mL 0   diltiazem (CARDIZEM) 60 MG tablet Take 90 mg by mouth 4 (four) times daily.     furosemide (LASIX) 20 MG tablet Take 1 tablet (20 mg total) by mouth every other day. 15 tablet 5   levalbuterol (XOPENEX) 0.63 MG/3ML nebulizer solution USE 1 VIAL IN NEBULIZER EVERY 4 HOURS AS NEEDED FOR WHEEZING FOR SHORTNESS OF BREATH 75 mL 5   levothyroxine (SYNTHROID) 50 MCG tablet Take 1 tablet (50 mcg total) by mouth daily before breakfast. 90 tablet 1   mirtazapine (REMERON) 15 MG tablet TAKE 1 TABLET BY MOUTH AT BEDTIME 90 tablet 0   Multiple Vitamin (MULTIVITAMIN WITH MINERALS) TABS tablet Take 1 tablet by mouth daily.     potassium chloride SA (KLOR-CON M) 20 MEQ tablet Take 20 mEq by mouth every other day.     triamcinolone (KENALOG) 0.025 % ointment Apply 1 Application topically 2 (two) times daily. To eyelids and area under eyes 30 g 0   XARELTO 15 MG TABS tablet TAKE 1 TABLET EVERY DAY 90 tablet 3   benzonatate (TESSALON) 200 MG capsule Take 1 capsule (200 mg total) by mouth 2 (two) times daily as needed for cough. 20 capsule 0   pantoprazole (PROTONIX) 20 MG tablet Take 1 tablet (20 mg total) by mouth 2 (two) times daily before a meal. 180 tablet 0   amoxicillin-clavulanate (AUGMENTIN) 875-125 MG  tablet Take 1 tablet by mouth 2 (two) times daily. (Patient not taking: Reported on 07/06/2022) 14 tablet 0   No facility-administered medications prior to visit.    Review of Systems;  Patient denies headache, fevers, unintentional weight loss, skin rash, eye pain, sinus congestion and sinus pain, sore throat, dysphagia,  hemoptysis , wheezing, chest pain, palpitations, orthopnea, edema, abdominal pain, nausea, melena, diarrhea, constipation, flank pain, dysuria, hematuria, urinary  Frequency, nocturia, numbness, tingling, seizures,  Focal weakness, Loss of consciousness,  Tremor, insomnia, depression, anxiety, and suicidal ideation.      Objective:  BP 132/66    Pulse (!) 114   Temp 98.2 F (36.8 C) (Oral)   Ht 5\' 3"  (1.6 m)   Wt 119 lb 12.8 oz (54.3 kg)   SpO2 96%   BMI 21.22 kg/m   BP Readings from Last 3 Encounters:  07/06/22 132/66  06/30/22 102/60  06/27/22 98/68    Wt Readings from Last 3 Encounters:  07/06/22 119 lb 12.8 oz (54.3 kg)  06/30/22 120 lb 3.2 oz (54.5 kg)  06/27/22 121 lb (54.9 kg)    Physical Exam Vitals reviewed.  Constitutional:      Appearance: Normal appearance. She is normal weight. She is not toxic-appearing or diaphoretic.  HENT:     Head: Normocephalic.  Eyes:     General: No scleral icterus.       Right eye: No discharge.        Left eye: No discharge.     Conjunctiva/sclera: Conjunctivae normal.  Cardiovascular:     Rate and Rhythm: Tachycardia present. Rhythm irregular.     Pulses: Normal pulses.     Heart sounds: Normal heart sounds.  Pulmonary:     Effort: Pulmonary effort is normal. No respiratory distress.     Breath sounds: Normal breath sounds.  Musculoskeletal:        General: Normal range of motion.  Skin:    General: Skin is warm and dry.  Neurological:     General: No focal deficit present.     Mental Status: She is alert and oriented to person, place, and time. Mental status is at baseline.  Psychiatric:        Mood and Affect: Mood normal.        Behavior: Behavior normal.        Thought Content: Thought content normal.        Judgment: Judgment normal.    Lab Results  Component Value Date   HGBA1C 5.8 (H) 11/02/2020   HGBA1C 6.1 (H) 10/16/2020    Lab Results  Component Value Date   CREATININE 1.16 07/06/2022   CREATININE 1.35 (H) 06/27/2022   CREATININE 1.28 (H) 06/01/2022    Lab Results  Component Value Date   WBC 6.7 06/27/2022   HGB 12.9 06/27/2022   HCT 38.5 06/27/2022   PLT 194.0 06/27/2022   GLUCOSE 83 07/06/2022   CHOL 257 (H) 11/23/2021   TRIG 104.0 11/23/2021   HDL 96.40 11/23/2021   LDLDIRECT 185.3 03/27/2013   LDLCALC 140 (H) 11/23/2021   ALT  13 06/27/2022   AST 19 06/27/2022   NA 141 07/06/2022   K 4.6 07/06/2022   CL 105 07/06/2022   CREATININE 1.16 07/06/2022   BUN 16 07/06/2022   CO2 28 07/06/2022   TSH 3.34 11/23/2021   INR 3.0 (H) 10/15/2020   HGBA1C 5.8 (H) 11/02/2020    DG Chest 2 View  Result Date: 06/27/2022 CLINICAL DATA:  Cough EXAM: CHEST -  2 VIEW COMPARISON:  03/10/2021 FINDINGS: Loop recorder device overlies the left chest. Cardiomegaly. Patchy airspace opacity within the left upper lobe. The right lung appears clear. No pleural effusion or pneumothorax. Slightly progressive height loss of a chronic compression fracture of the T11 vertebral body. IMPRESSION: 1. Patchy airspace opacity within the left upper lobe, suspicious for pneumonia. 2. Slightly progressive height loss of a chronic compression fracture of the T11 vertebral body. Correlate with point tenderness. Electronically Signed   By: Duanne Guess D.O.   On: 06/27/2022 16:23    Assessment & Plan:  .Community acquired pneumonia of left upper lobe of lung Assessment & Plan: Suggested by  LUL infiltrate noted on initial chest x ray  done May 13 .  She has completed treatment with augmentin and azithromycin .  REPEAT FILMS ORDERED and read by me suggest slight improvement in aeration of LUL.    Her BNP has improved slightly (from 397 to 350) and she appears better hydrated both clinically and by GFR     Orders: -     DG Chest 2 View; Future  Acute cough -     Benzonatate; Take 1 capsule (200 mg total) by mouth 2 (two) times daily as needed for cough.  Dispense: 60 capsule; Refill: 0  Sick sinus syndrome (HCC) Assessment & Plan: Increase diltiazem to 90 mg q 6 hrs.  S/p PPM Oct 2023    Shortness of breath -     Basic metabolic panel -     Brain natriuretic peptide  Unintentional weight loss Assessment & Plan: She has gained several lbs since starting remeron AND WT IS RELATIVELY STABLE based on readings obtained from the SAME SCALE .   Encouarged to supplement diet with high protein shaeks.      Pneumonia of left upper lobe due to infectious organism Assessment & Plan: Suggested by  LUL infiltrate noted on initial chest x ray  done May 13 .  She has completed treatment with augmentin and azithromycin .  REPEAT FILMS ORDERED and read by me suggest slight improvement in aeration of LUL.    Her BNP has improved slightly (from 397 to 350) and she appears better hydrated both clinically and by GFR      ILD (interstitial lung disease) [facility]) Assessment & Plan: Managed by  Dr Jayme Cloud    Malnutrition of moderate degree [facility]) Assessment & Plan: Secondary to anorexia altered sense of taste, and  malaise.  Improved s/p pacemaker implantation.  Continue mirtazapine    Pacemaker  Atrial fibrillation status post cardioversion [facility]) Assessment & Plan: She riemains  in atrial fibrillation, likely due to CAP.  Increase diltiazem to 90 mg q 6 hours. Follow up with cardiology on Friday    Bradycardia Assessment & Plan: Secondary to SSS  s/p PPM Oct 2023       I provided 39 minutes of face-to-face time during this encounter reviewing patient's last visit with me, patient's  most recent visit with cardiology,  pulmonology,   recent surgical and non surgical procedures, previous  labs and imaging studies, counseling on currently addressed issues,  and post visit ordering to diagnostics and therapeutics .   Follow-up: No follow-ups on file.   Sherlene Shams, MD

## 2022-07-06 NOTE — Assessment & Plan Note (Signed)
Managed by  Dr Jayme Cloud

## 2022-07-06 NOTE — Assessment & Plan Note (Addendum)
She riemains  in atrial fibrillation, likely due to CAP.  Increase diltiazem to 90 mg q 6 hours. Follow up with cardiology on Friday

## 2022-07-06 NOTE — Assessment & Plan Note (Signed)
Secondary to anorexia altered sense of taste, and  malaise.  Improved s/p pacemaker implantation.  Continue mirtazapine

## 2022-07-06 NOTE — Assessment & Plan Note (Addendum)
She has gained several lbs since starting remeron AND WT IS RELATIVELY STABLE based on readings obtained from the SAME SCALE .  Encouarged to supplement diet with high protein shaeks.

## 2022-07-06 NOTE — Assessment & Plan Note (Addendum)
Suggested by  LUL infiltrate noted on initial chest x ray  done May 13 .  She has completed treatment with augmentin and azithromycin .  REPEAT FILMS ORDERED and read by me suggest slight improvement in aeration of LUL.    Her BNP has improved slightly (from 397 to 350) and she appears better hydrated both clinically and by GFR

## 2022-07-06 NOTE — Patient Instructions (Addendum)
Increase the diltiazem dose to 90 mg every 6 hours  (3  1/2 tablets every 4  times daily )  TO SLOW YOUR HEART RATE DOWN   THE PACEMAKER WILL KEEP YOU FROM GETTING TOO SLOW  INCREASE YOU LIQUIDS  YOU LOOK DEHYDRATED 2  Keep drinking the EQuate smoothie EVERY DAY

## 2022-07-07 LAB — BASIC METABOLIC PANEL
BUN: 16 mg/dL (ref 6–23)
CO2: 28 mEq/L (ref 19–32)
Calcium: 9.4 mg/dL (ref 8.4–10.5)
Chloride: 105 mEq/L (ref 96–112)
Creatinine, Ser: 1.16 mg/dL (ref 0.40–1.20)
GFR: 41.7 mL/min — ABNORMAL LOW (ref >60.00)
Glucose, Bld: 83 mg/dL (ref 70–99)
Potassium: 4.6 mEq/L (ref 3.5–5.1)
Sodium: 141 mEq/L (ref 135–145)

## 2022-07-07 LAB — BRAIN NATRIURETIC PEPTIDE: Brain Natriuretic Peptide: 357 pg/mL — ABNORMAL HIGH (ref <100)

## 2022-07-08 DIAGNOSIS — I5023 Acute on chronic systolic (congestive) heart failure: Secondary | ICD-10-CM | POA: Diagnosis not present

## 2022-07-08 DIAGNOSIS — I5020 Unspecified systolic (congestive) heart failure: Secondary | ICD-10-CM | POA: Diagnosis not present

## 2022-07-08 DIAGNOSIS — I480 Paroxysmal atrial fibrillation: Secondary | ICD-10-CM | POA: Diagnosis not present

## 2022-07-08 DIAGNOSIS — I272 Pulmonary hypertension, unspecified: Secondary | ICD-10-CM | POA: Diagnosis not present

## 2022-07-08 DIAGNOSIS — I10 Essential (primary) hypertension: Secondary | ICD-10-CM | POA: Diagnosis not present

## 2022-07-08 DIAGNOSIS — I071 Rheumatic tricuspid insufficiency: Secondary | ICD-10-CM | POA: Diagnosis not present

## 2022-07-08 DIAGNOSIS — I4892 Unspecified atrial flutter: Secondary | ICD-10-CM | POA: Diagnosis not present

## 2022-07-08 DIAGNOSIS — I050 Rheumatic mitral stenosis: Secondary | ICD-10-CM | POA: Diagnosis not present

## 2022-07-08 DIAGNOSIS — I4819 Other persistent atrial fibrillation: Secondary | ICD-10-CM | POA: Diagnosis not present

## 2022-07-08 NOTE — Assessment & Plan Note (Signed)
Secondary to SSS  s/p PPM Oct 2023

## 2022-07-08 NOTE — Assessment & Plan Note (Signed)
Increase diltiazem to 90 mg q 6 hrs.  S/p [facility] Oct 2023

## 2022-07-20 DIAGNOSIS — I5020 Unspecified systolic (congestive) heart failure: Secondary | ICD-10-CM | POA: Diagnosis not present

## 2022-07-20 DIAGNOSIS — I480 Paroxysmal atrial fibrillation: Secondary | ICD-10-CM | POA: Diagnosis not present

## 2022-07-20 DIAGNOSIS — I341 Nonrheumatic mitral (valve) prolapse: Secondary | ICD-10-CM | POA: Diagnosis not present

## 2022-07-20 DIAGNOSIS — I10 Essential (primary) hypertension: Secondary | ICD-10-CM | POA: Diagnosis not present

## 2022-07-20 DIAGNOSIS — I2699 Other pulmonary embolism without acute cor pulmonale: Secondary | ICD-10-CM | POA: Diagnosis not present

## 2022-07-20 DIAGNOSIS — R001 Bradycardia, unspecified: Secondary | ICD-10-CM | POA: Diagnosis not present

## 2022-07-20 DIAGNOSIS — I071 Rheumatic tricuspid insufficiency: Secondary | ICD-10-CM | POA: Diagnosis not present

## 2022-07-20 DIAGNOSIS — I4892 Unspecified atrial flutter: Secondary | ICD-10-CM | POA: Diagnosis not present

## 2022-07-20 DIAGNOSIS — I272 Pulmonary hypertension, unspecified: Secondary | ICD-10-CM | POA: Diagnosis not present

## 2022-07-26 DIAGNOSIS — I5023 Acute on chronic systolic (congestive) heart failure: Secondary | ICD-10-CM | POA: Diagnosis not present

## 2022-08-01 ENCOUNTER — Telehealth: Payer: Self-pay | Admitting: Internal Medicine

## 2022-08-01 DIAGNOSIS — J189 Pneumonia, unspecified organism: Secondary | ICD-10-CM

## 2022-08-01 DIAGNOSIS — I480 Paroxysmal atrial fibrillation: Secondary | ICD-10-CM | POA: Diagnosis not present

## 2022-08-01 NOTE — Addendum Note (Signed)
Addended by: Sherlene Shams on: 08/01/2022 05:35 PM   Modules accepted: Orders

## 2022-08-01 NOTE — Telephone Encounter (Signed)
[name] Elita Quick [phone] called to schedule a repeat xray, no orders.

## 2022-08-01 NOTE — Addendum Note (Signed)
Addended by: Sandy Salaam on: 08/01/2022 03:54 PM   Modules accepted: Orders

## 2022-08-01 NOTE — Telephone Encounter (Signed)
Per last chest xray pt is supposed to have a repeat xray four weeks from last one. I have pended the order for you approval. When signed I will call pt to schedule an appt.

## 2022-08-02 DIAGNOSIS — H353212 Exudative age-related macular degeneration, right eye, with inactive choroidal neovascularization: Secondary | ICD-10-CM | POA: Diagnosis not present

## 2022-08-02 NOTE — Telephone Encounter (Signed)
Spoke with pt and scheduled an appt for Thursday.

## 2022-08-04 ENCOUNTER — Other Ambulatory Visit: Payer: Medicare HMO

## 2022-08-04 ENCOUNTER — Ambulatory Visit (INDEPENDENT_AMBULATORY_CARE_PROVIDER_SITE_OTHER): Payer: Medicare HMO

## 2022-08-04 DIAGNOSIS — J189 Pneumonia, unspecified organism: Secondary | ICD-10-CM | POA: Diagnosis not present

## 2022-08-04 DIAGNOSIS — R059 Cough, unspecified: Secondary | ICD-10-CM | POA: Diagnosis not present

## 2022-08-04 DIAGNOSIS — R634 Abnormal weight loss: Secondary | ICD-10-CM | POA: Diagnosis not present

## 2022-08-09 DIAGNOSIS — I341 Nonrheumatic mitral (valve) prolapse: Secondary | ICD-10-CM | POA: Diagnosis not present

## 2022-08-09 DIAGNOSIS — I2699 Other pulmonary embolism without acute cor pulmonale: Secondary | ICD-10-CM | POA: Diagnosis not present

## 2022-08-09 DIAGNOSIS — I480 Paroxysmal atrial fibrillation: Secondary | ICD-10-CM | POA: Diagnosis not present

## 2022-08-09 DIAGNOSIS — I10 Essential (primary) hypertension: Secondary | ICD-10-CM | POA: Diagnosis not present

## 2022-08-09 DIAGNOSIS — I071 Rheumatic tricuspid insufficiency: Secondary | ICD-10-CM | POA: Diagnosis not present

## 2022-08-09 DIAGNOSIS — I272 Pulmonary hypertension, unspecified: Secondary | ICD-10-CM | POA: Diagnosis not present

## 2022-08-09 DIAGNOSIS — R001 Bradycardia, unspecified: Secondary | ICD-10-CM | POA: Diagnosis not present

## 2022-08-09 DIAGNOSIS — I2692 Saddle embolus of pulmonary artery without acute cor pulmonale: Secondary | ICD-10-CM | POA: Diagnosis not present

## 2022-08-09 DIAGNOSIS — I5020 Unspecified systolic (congestive) heart failure: Secondary | ICD-10-CM | POA: Diagnosis not present

## 2022-08-22 ENCOUNTER — Other Ambulatory Visit: Payer: Self-pay | Admitting: Internal Medicine

## 2022-08-31 IMAGING — DX DG CHEST 1V PORT
1 series · 1 of 1 positions shown · non-contrast
Comparison: January 12, 2021.

CLINICAL DATA: Shortness of breath in a female at age 89.

EXAM:
PORTABLE CHEST 1 VIEW

[chest ap]
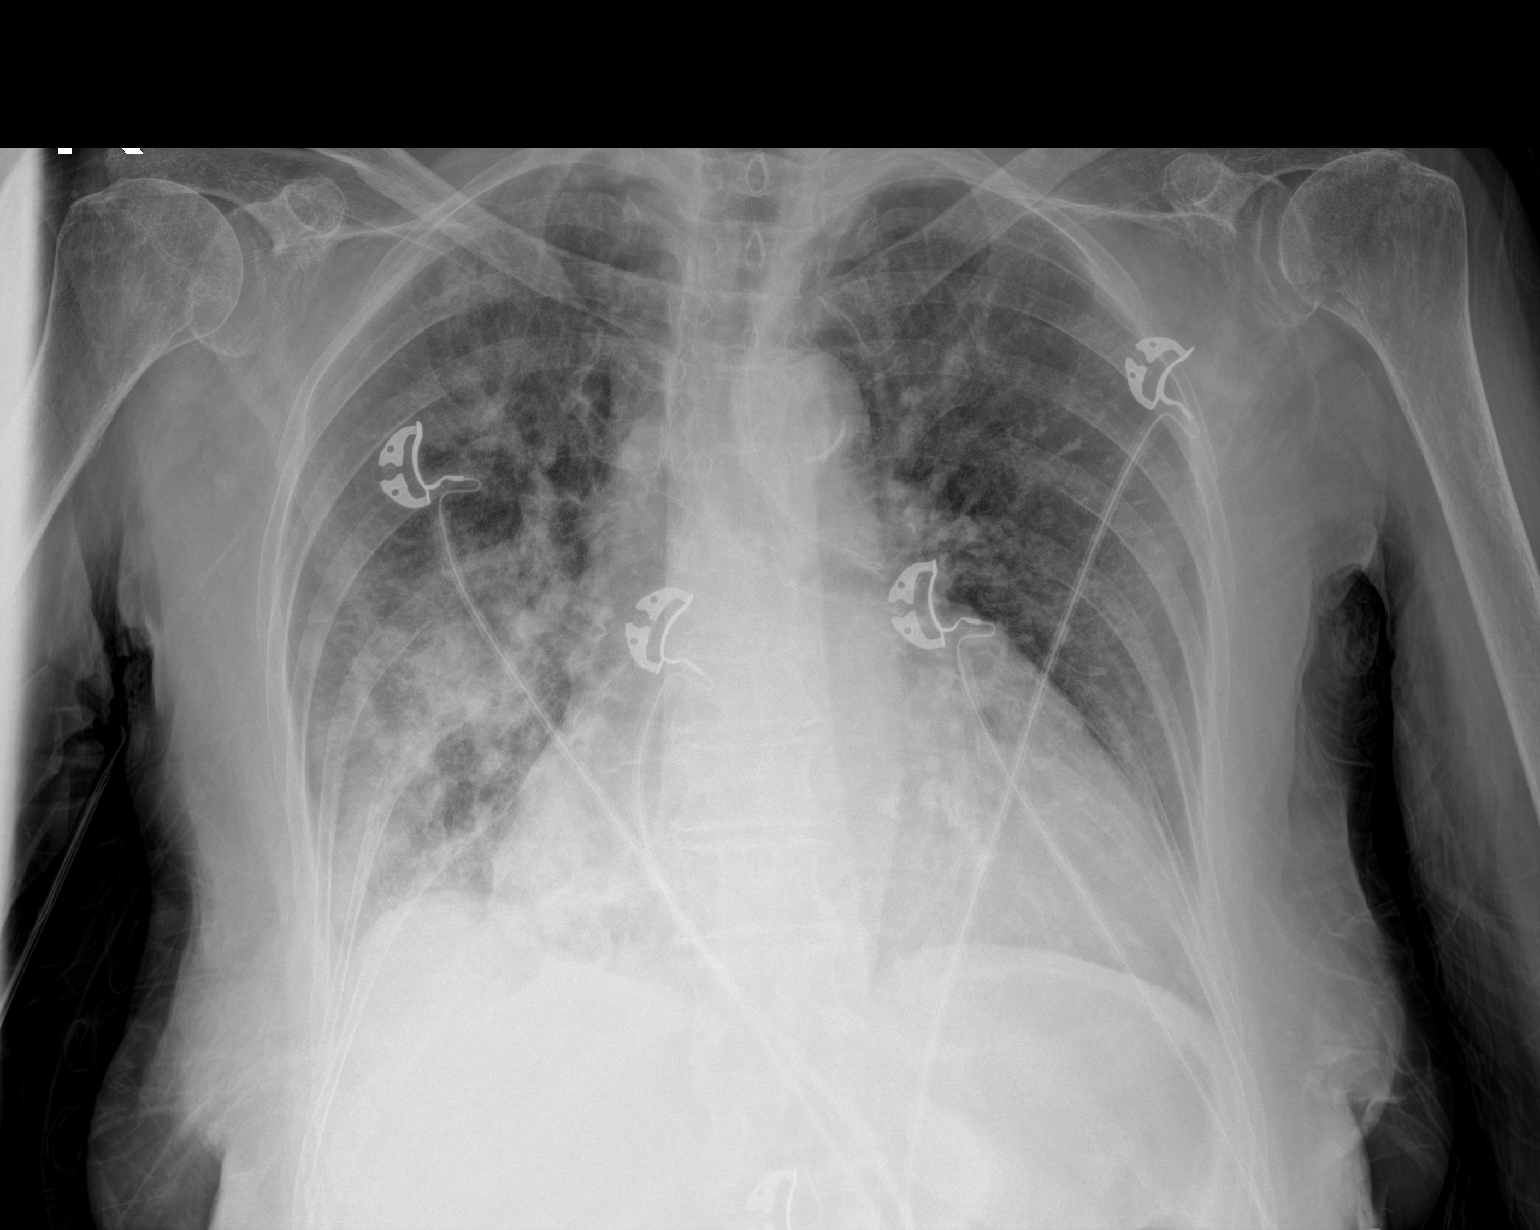

[1 of 1 positions shown; findings below may reference images not displayed]

FINDINGS: EKG leads project over the chest.

Trachea midline.

Heart size is enlarged as before. Graded opacity suggested at the
RIGHT lung base.

Marked increased density about the RIGHT mid chest compared to the
previous exam and scattered multifocal opacities including RIGHT
lower lobe in the RIGHT retrocardiac region. Mild increased
interstitial prominence in the LEFT chest.

No visible pneumothorax.

On limited assessment there is no acute skeletal process.
IMPRESSION: Findings suspicious for multifocal pneumonia in the RIGHT chest
potentially associated with RIGHT-sided effusion.

Cardiomegaly with signs of vascular congestion. Also correlate with
any signs of heart failure.

## 2022-09-01 ENCOUNTER — Other Ambulatory Visit: Payer: Self-pay | Admitting: Pulmonary Disease

## 2022-09-03 IMAGING — DX DG CHEST 1V PORT
2 series · 2 of 2 positions shown · non-contrast
Comparison: Chest x-ray dated February 02, 2021.

CLINICAL DATA: Shortness of breath.

EXAM:
PORTABLE CHEST 1 VIEW

[chest ap (1 of 2)]
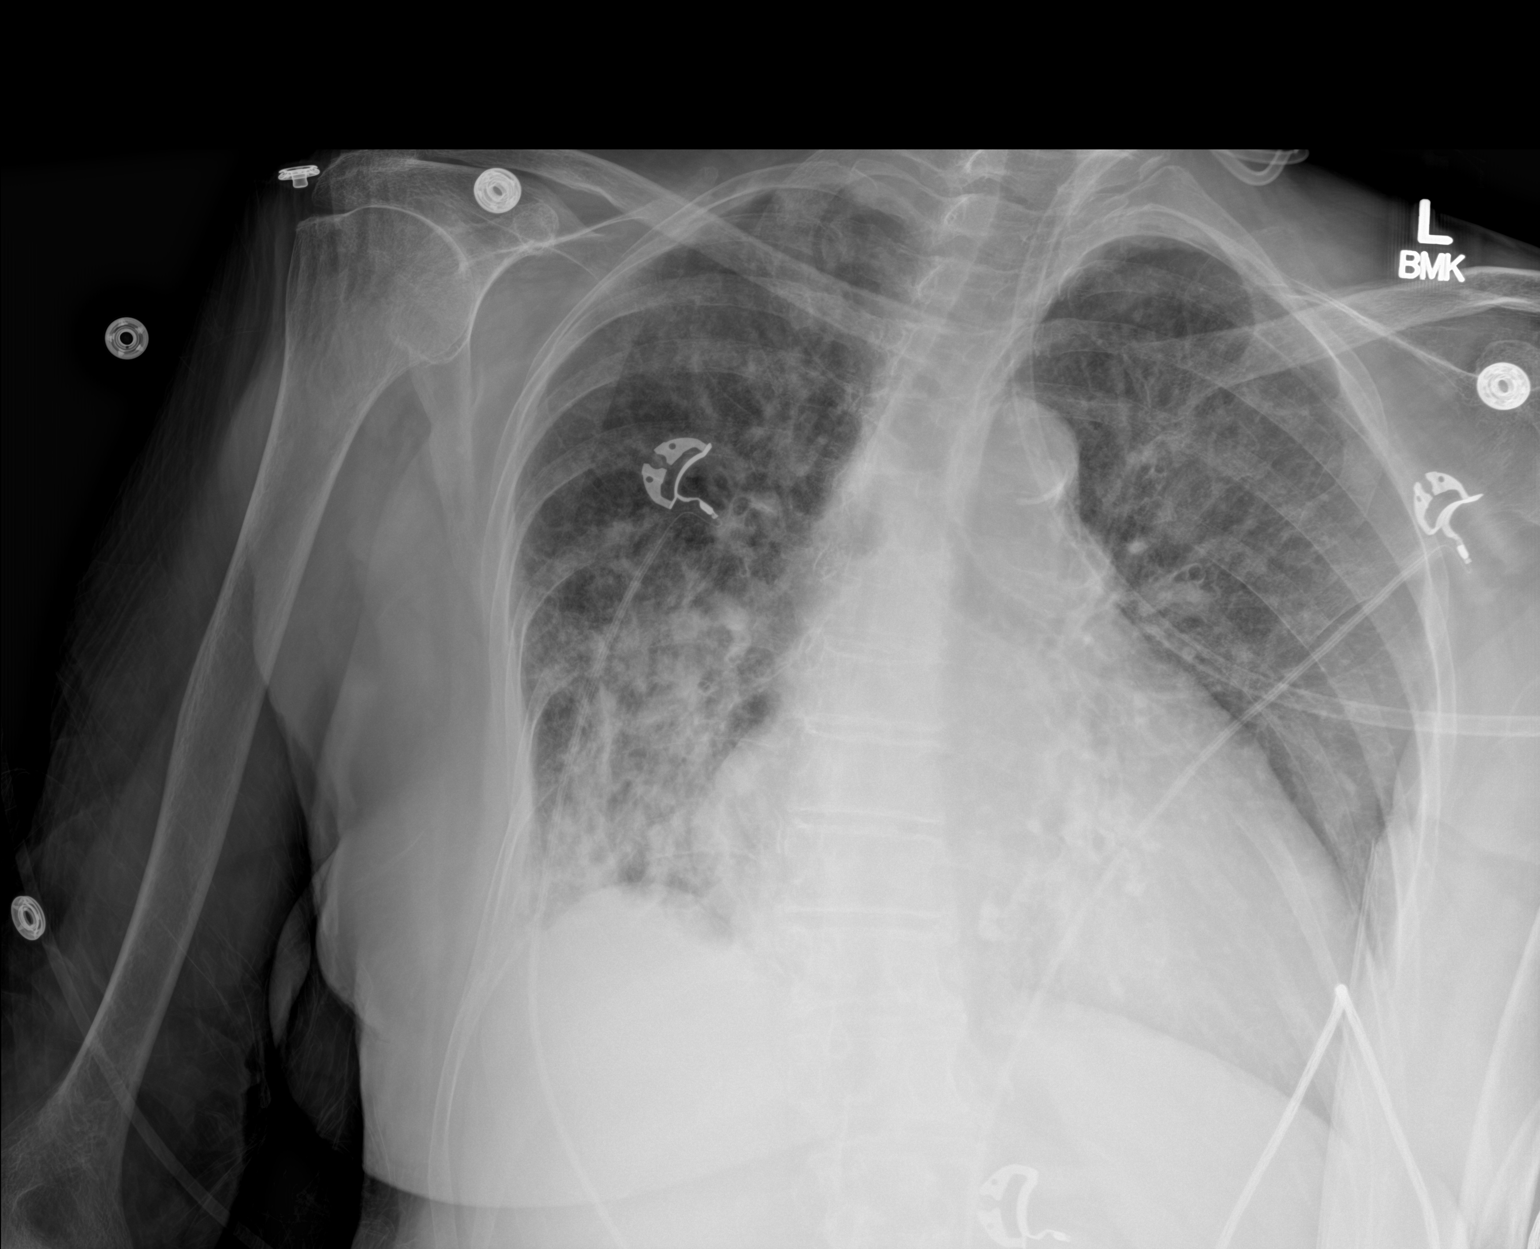

[chest ap (2 of 2)]
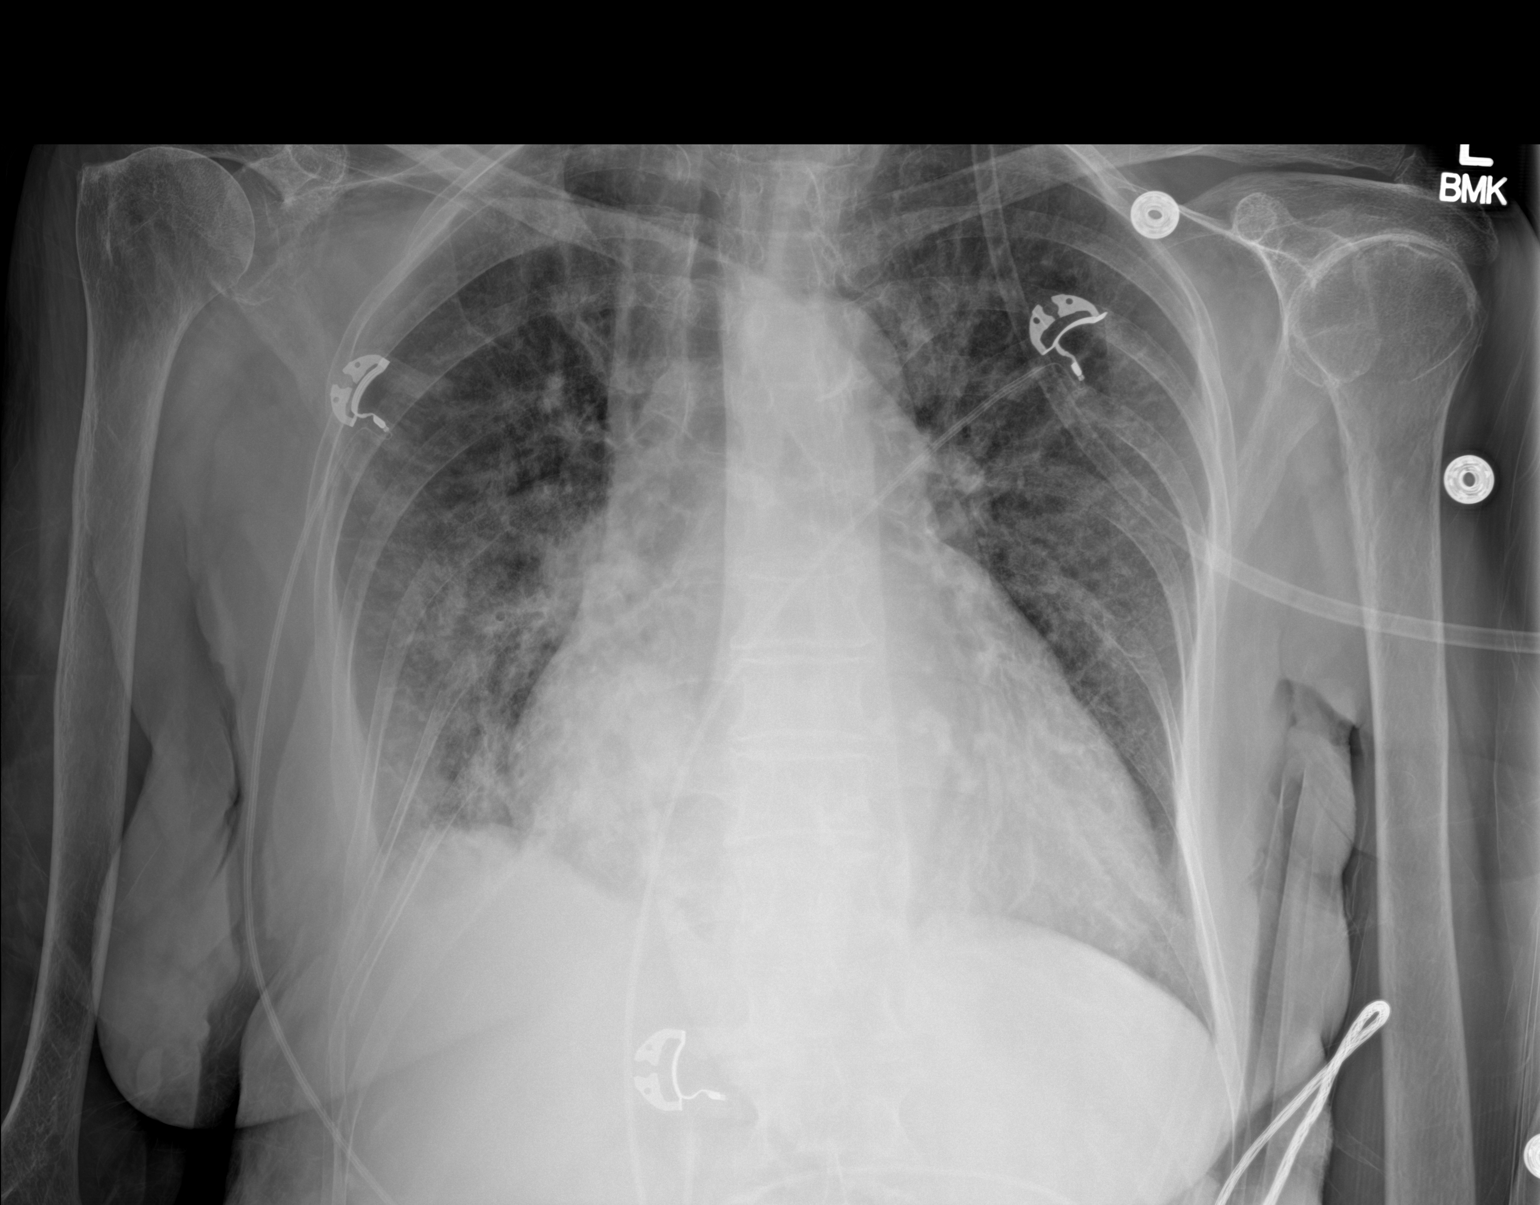

[2 of 2 positions shown; findings below may reference images not displayed]

FINDINGS: Stable cardiomegaly. Patchy consolidation in the right lower lobe
has mildly improved. Unchanged small right pleural effusion. No
pneumothorax. No acute osseous abnormality.
IMPRESSION: 1. Mildly improved right lower lobe pneumonia.

## 2022-09-06 IMAGING — DX DG CHEST 1V PORT
1 series · 1 of 1 positions shown · non-contrast
Comparison: 02/05/2021

CLINICAL DATA: Shortness of breath.

EXAM:
PORTABLE CHEST 1 VIEW

[chest ap]
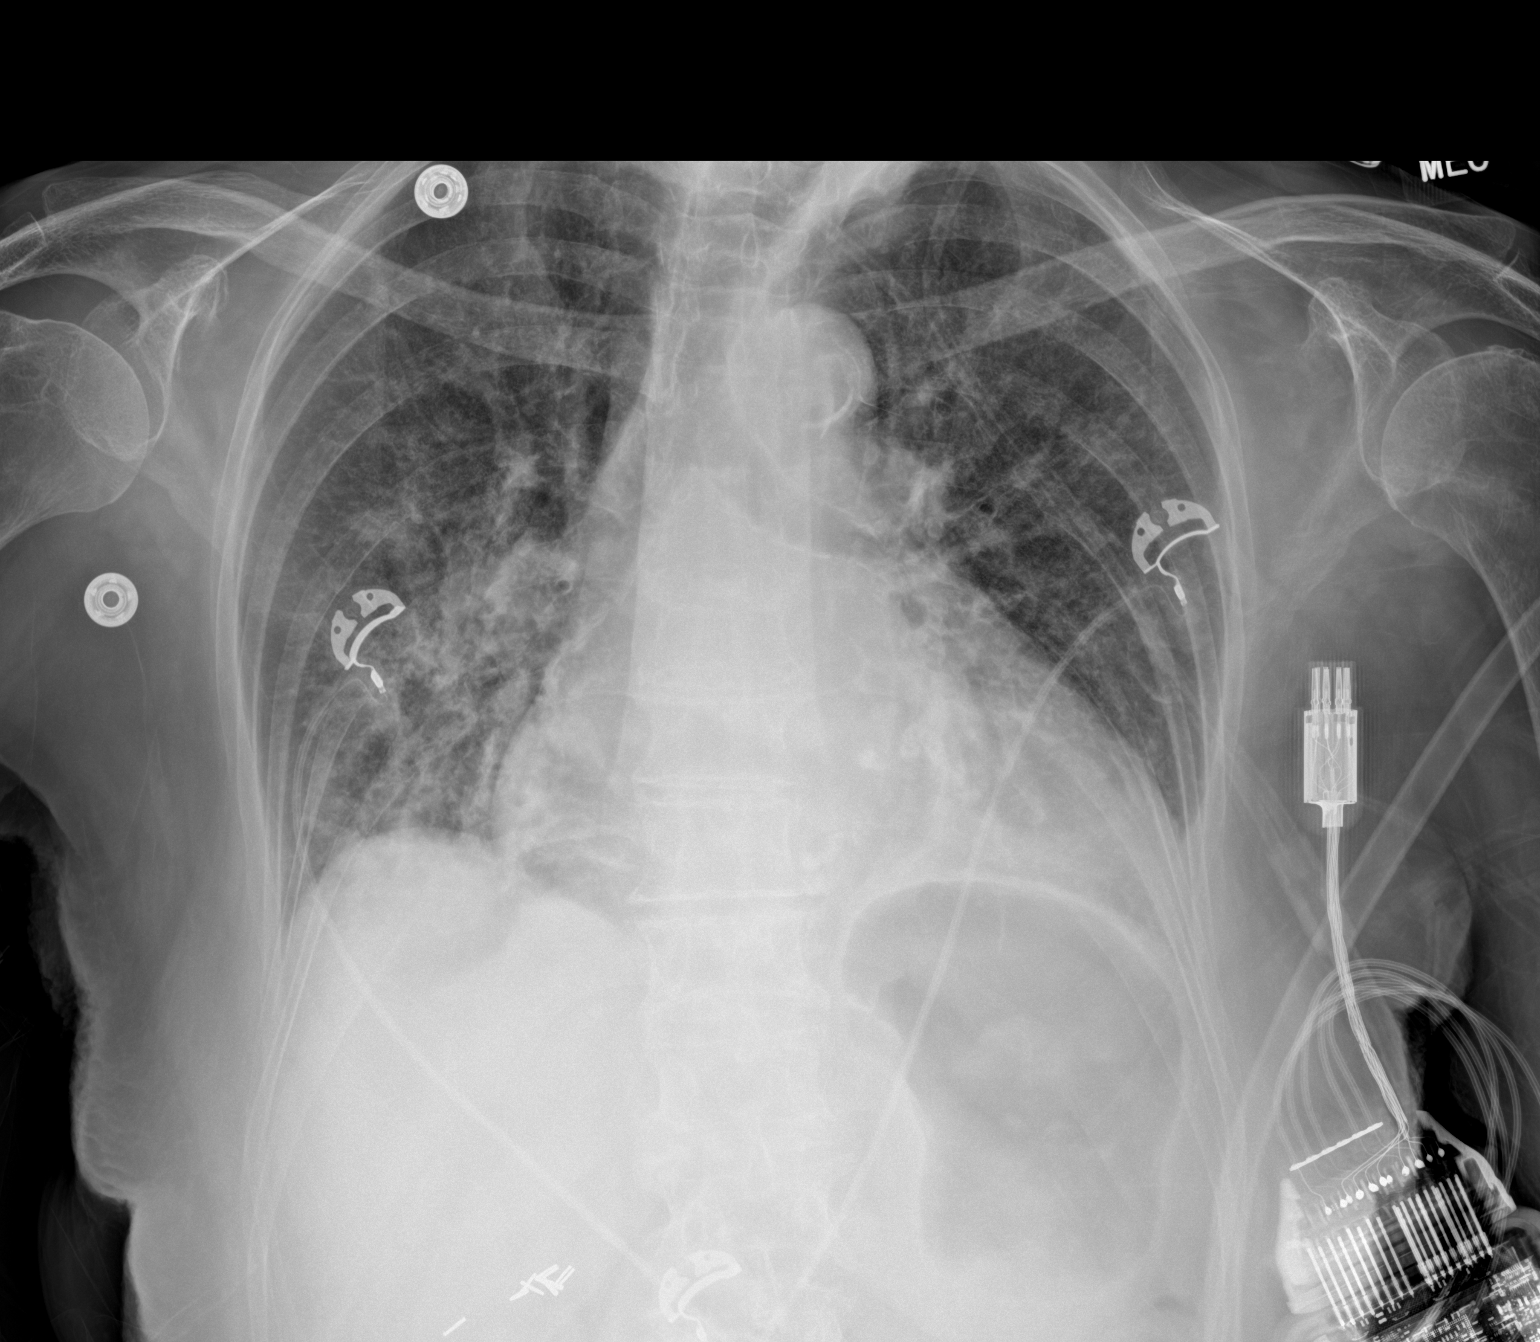

[1 of 1 positions shown; findings below may reference images not displayed]

FINDINGS: 4342 hours. The cardio pericardial silhouette is enlarged. Vascular
congestion with diffuse interstitial opacity is similar to prior.
Interval improvement in right base airspace disease. The visualized
bony structures of the thorax show no acute abnormality. Telemetry
leads overlie the chest.
IMPRESSION: Interval improvement in right base airspace disease.

## 2022-09-23 ENCOUNTER — Encounter: Payer: Self-pay | Admitting: Pulmonary Disease

## 2022-09-27 DIAGNOSIS — I700 Atherosclerosis of aorta: Secondary | ICD-10-CM | POA: Diagnosis not present

## 2022-09-27 DIAGNOSIS — Z86711 Personal history of pulmonary embolism: Secondary | ICD-10-CM | POA: Diagnosis not present

## 2022-09-27 DIAGNOSIS — I071 Rheumatic tricuspid insufficiency: Secondary | ICD-10-CM | POA: Diagnosis not present

## 2022-09-27 DIAGNOSIS — I341 Nonrheumatic mitral (valve) prolapse: Secondary | ICD-10-CM | POA: Diagnosis not present

## 2022-09-27 DIAGNOSIS — I272 Pulmonary hypertension, unspecified: Secondary | ICD-10-CM | POA: Diagnosis not present

## 2022-09-27 DIAGNOSIS — I480 Paroxysmal atrial fibrillation: Secondary | ICD-10-CM | POA: Diagnosis not present

## 2022-09-27 DIAGNOSIS — I5020 Unspecified systolic (congestive) heart failure: Secondary | ICD-10-CM | POA: Diagnosis not present

## 2022-09-27 DIAGNOSIS — I10 Essential (primary) hypertension: Secondary | ICD-10-CM | POA: Diagnosis not present

## 2022-09-27 DIAGNOSIS — R001 Bradycardia, unspecified: Secondary | ICD-10-CM | POA: Diagnosis not present

## 2022-09-29 ENCOUNTER — Encounter: Payer: Self-pay | Admitting: Pulmonary Disease

## 2022-09-29 ENCOUNTER — Ambulatory Visit: Payer: Medicare HMO | Admitting: Pulmonary Disease

## 2022-09-29 VITALS — BP 122/70 | HR 50 | Temp 98.1°F | Ht 63.0 in | Wt 124.2 lb

## 2022-09-29 DIAGNOSIS — I5022 Chronic systolic (congestive) heart failure: Secondary | ICD-10-CM | POA: Diagnosis not present

## 2022-09-29 DIAGNOSIS — R0602 Shortness of breath: Secondary | ICD-10-CM | POA: Diagnosis not present

## 2022-09-29 DIAGNOSIS — I495 Sick sinus syndrome: Secondary | ICD-10-CM

## 2022-09-29 NOTE — Patient Instructions (Signed)
Your lungs sounded really clear today.  Continue your nebulizer twice a day as you are doing.  We will see you in follow-up in 6 months time call sooner should any new problems arise.

## 2022-09-29 NOTE — Progress Notes (Signed)
Subjective:    Patient ID: Frances Kent, female    DOB: 01-Mar-1930, 89 y.o.   MRN: [id number]  Patient Care Team: Sherlene Shams, MD as PCP - General (Internal Medicine) Sherlene Shams, MD (Internal Medicine) Rickard Patience, MD as Consulting Physician (Hematology and Oncology) Salena Saner, MD as Consulting Physician (Pulmonary Disease)  Chief Complaint  Patient presents with   Follow-up    No SOB, wheezing or cough.     HPI Frances Kent is a 89 year old female, never smoked. PMH significant for afib, heart failure EF 40%, CVA, HTN, mitral valve prolapse, prolonged QT interval, ILD, pleural effusion on left, GERD, dysphagia, chronic kidney disease.  She follows here after her most recent visit on 17 Jun 2022 but with Buelah Manis, NP. She has not had any difficulty since that visit.  She is followed here mostly for shortness of breath that is actually related to meals.  Recall that she had upper endoscopy in January 2023 he was noted to have impacted food and significant esophagitis.  She had at that point stop PPIs on her own.  Recall that PPIs were started because her shortness of breath occurred mostly postprandially and was consistent with chronic silent aspiration from reflux.  She is now maintained on Protonix.  She also follows a careful diet and has had no difficulties.  She has continued to use Xopenex twice a day via nebulizer as she feels that this keeps her from having shortness of breath during the day.  She uses it an extra time during the day if needed.  She still lives independently.  She does have a very attentive daughter that checks on her daily. She is able to do some light housework, limited d/t back pain. Denies cough, chest pain, chest tightness, wheezing, chest pain, dizziness/lightheadedness.   She is frail but very alert and engaging otherwise.  She presents ambulatory today without assistance.   Review of Systems A 10 point review of systems was performed and it is  as noted above otherwise negative.   Patient Active Problem List   Diagnosis Date Noted   Pacemaker 07/06/2022   Community acquired pneumonia 06/30/2022   Eczema of eyelid 03/25/2022   Psoriasis of scalp 03/25/2022   Essential tremor 11/23/2021   Vertebral fracture, osteoporotic, sequela 02/23/2021   Depression with anxiety 02/02/2021   History of embolic stroke 12/08/2020   Inflamed external hemorrhoid 12/08/2020   Insomnia due to anxiety and fear 12/08/2020   Long term (current) use of anticoagulants    Chronic HFrEF (heart failure with reduced ejection fraction) (HCC)    Malnutrition of moderate degree (HCC) 11/03/2020   Prolonged QT interval    ILD (interstitial lung disease) (HCC) 09/22/2020   Atrial flutter, paroxysmal (HCC) 09/10/2020   Sick sinus syndrome (HCC) 09/08/2020   Elevated serum free T4 level 09/08/2020   Purpura senilis (HCC) 08/28/2020   Hiatal hernia 08/09/2020   Acquired thrombophilia (HCC) 08/06/2020   Elevated troponin I level 05/21/2020   CVA (cerebral vascular accident) (HCC) 03/24/2020   History of COVID-19 03/03/2020   Bilateral carotid artery stenosis 12/23/2019   Schatzki's ring of distal esophagus 12/11/2019   Erosive gastritis 11/30/2019   Dysphagia 11/27/2019   Painful swallowing 11/27/2019   Gastroesophageal reflux disease 11/27/2019   Stage 3a chronic kidney disease (HCC) 04/11/2019   Essential hypertension 09/12/2018   Chronic systolic CHF (congestive heart failure) (HCC) 08/15/2018   Hypothyroidism due to acquired atrophy of thyroid 05/08/2018  Unintentional weight loss 05/08/2018   Myalgia due to statin 05/08/2018   Abdominal aortic atherosclerosis (HCC) 05/07/2018   Bradycardia 04/28/2018   Pleural effusion on left 02/27/2018   Moderate mitral stenosis 01/10/2018   Lumbar radiculitis 01/07/2018   B12 deficiency 07/26/2017   Atrial fibrillation status post cardioversion [facility]) 04/06/2016   Hospital discharge follow-up 03/10/2016    Dyspnea 03/04/2016   Vitamin D deficiency 04/08/2015   Cervical spine degeneration 12/13/2014   History of pulmonary embolism 12/02/2014   Insomnia 10/04/2014   Moderate tricuspid insufficiency 09/17/2014   Generalized anxiety disorder 03/28/2013   Mitral valve prolapse 03/28/2013   Routine adult health maintenance 03/23/2012   Fatigue 03/23/2012   Cough 04/17/2011   Screening for breast cancer 11/29/2010   Macular degeneration, left eye 11/29/2010   Screening for colon cancer 11/29/2010   Hyperlipidemia LDL goal <70 11/29/2010   GERD (gastroesophageal reflux disease)     Social History   Tobacco Use   Smoking status: Never   Smokeless tobacco: Never   Tobacco comments:    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 , worked at [employer], [employer]  Substance Use Topics   Alcohol use: No    Allergies  Allergen Reactions   Statins     Severe myalgias    Current Meds  Medication Sig   acetaminophen (TYLENOL) 500 MG tablet Take 500 mg by mouth every 6 (six) hours as needed.   amiodarone (PACERONE) 200 MG tablet Take 0.5 tablets (100 mg total) by mouth daily. Home med.   clobetasol (TEMOVATE) 0.05 % external solution Apply 1 Application topically 2 (two) times daily. To scalp   diltiazem (CARDIZEM CD) 180 MG 24 hr capsule Take 180 mg by mouth daily.   furosemide (LASIX) 20 MG tablet TAKE 1 TABLET BY MOUTH EVERY OTHER DAY   levalbuterol (XOPENEX) 0.63 MG/3ML nebulizer solution USE 1 VIAL  IN NEBULIZER EVERY 4 HOURS AS NEEDED FOR WHEEZING FOR SHORTNESS OF BREATH   levothyroxine (SYNTHROID) 50 MCG tablet Take 1 tablet (50 mcg total) by mouth daily before breakfast.   mirtazapine (REMERON) 15 MG tablet TAKE 1 TABLET BY MOUTH AT BEDTIME   Multiple Vitamin (MULTIVITAMIN WITH MINERALS) TABS tablet Take 1 tablet by mouth daily.   potassium chloride SA (KLOR-CON M) 20 MEQ tablet Take 20 mEq by mouth every other day.   triamcinolone (KENALOG) 0.025 % ointment Apply 1 Application topically 2 (two) times  daily. To eyelids and area under eyes   XARELTO 15 MG TABS tablet TAKE 1 TABLET EVERY DAY    Immunization History  Administered Date(s) Administered   Pneumococcal Conjugate-13 12/11/2014   Pneumococcal Polysaccharide-23 07/13/2016      Objective:     BP 122/70 (BP Location: Left Arm, Cuff Size: Normal)   Pulse (!) 50   Temp 98.1 F (36.7 C)   Ht 5\' 3"  (1.6 m)   Wt 124 lb 3.2 oz (56.3 kg)   SpO2 98%   BMI 22.00 kg/m   SpO2: 98 % O2 Device: None (Room air)  GENERAL: Frail-appearing elderly woman, presents in transport chair.  No tachypnea.  Debilitated appearing.  HEAD: Normocephalic, atraumatic.  EYES: Pupils equal, round, reactive to light.  No scleral icterus.  MOUTH: Oral mucosa moist. NECK: Supple. No thyromegaly. Trachea midline. No JVD.  No adenopathy. PULMONARY: Good air entry bilaterally.  No adventitious sounds. CARDIOVASCULAR: S1 and S2. Regular rate and rhythm.  2/6 systolic ejection murmur left sternal border ABDOMEN: Nondistended, soft, no hepatosplenomegaly.  No tenderness.  No masses.  MUSCULOSKELETAL: No joint deformity, no clubbing, no edema.   NEUROLOGIC: No focal deficit, speech is fluent. SKIN: Intact,warm,dry. PSYCH: Mood and behavior normal.   Assessment & Plan:     ICD-10-CM   1. Shortness of breath  R06.02    Multifactorial Frailty and debility/chronic heart failure Currently well compensated    2. Chronic HFrEF (heart failure with reduced ejection fraction) (HCC)  I50.22    Follows with cardiology This issue adds complexity to her management    3. Sick sinus syndrome (HCC)  I49.5    S/P leaderless pacer placement Follows now with [facility]     She is to continue medications as they are.  We will see her in follow-up in 6 months time call sooner should any new problems arise.   Gailen Shelter, MD Advanced Bronchoscopy PCCM [facility]    *This note was dictated using voice recognition  software/Dragon.  Despite best efforts to proofread, errors can occur which can change the meaning. Any transcriptional errors that result from this process are unintentional and may not be fully corrected at the time of dictation.

## 2022-10-18 DIAGNOSIS — H353212 Exudative age-related macular degeneration, right eye, with inactive choroidal neovascularization: Secondary | ICD-10-CM | POA: Diagnosis not present

## 2022-11-02 ENCOUNTER — Ambulatory Visit: Payer: Medicare HMO

## 2022-11-02 ENCOUNTER — Encounter: Payer: Self-pay | Admitting: Family Medicine

## 2022-11-02 ENCOUNTER — Ambulatory Visit (INDEPENDENT_AMBULATORY_CARE_PROVIDER_SITE_OTHER): Payer: Medicare HMO | Admitting: Family Medicine

## 2022-11-02 ENCOUNTER — Telehealth: Payer: Self-pay | Admitting: Internal Medicine

## 2022-11-02 VITALS — BP 112/64 | HR 54 | Temp 98.1°F | Ht 63.0 in | Wt 123.6 lb

## 2022-11-02 DIAGNOSIS — R5383 Other fatigue: Secondary | ICD-10-CM | POA: Diagnosis not present

## 2022-11-02 DIAGNOSIS — R5382 Chronic fatigue, unspecified: Secondary | ICD-10-CM | POA: Diagnosis not present

## 2022-11-02 DIAGNOSIS — I517 Cardiomegaly: Secondary | ICD-10-CM | POA: Diagnosis not present

## 2022-11-02 DIAGNOSIS — R059 Cough, unspecified: Secondary | ICD-10-CM

## 2022-11-02 DIAGNOSIS — R918 Other nonspecific abnormal finding of lung field: Secondary | ICD-10-CM | POA: Diagnosis not present

## 2022-11-02 LAB — POCT INFLUENZA A/B
Influenza A, POC: NEGATIVE
Influenza B, POC: NEGATIVE

## 2022-11-02 LAB — POC COVID19 BINAXNOW: SARS Coronavirus 2 Ag: NEGATIVE

## 2022-11-02 NOTE — Telephone Encounter (Signed)
Patient started with a cough and more tired than usual last week now she is having intermittent SOB. Patient heart rate is going from 50-70. Patient took her temp. While on the phone 97.8  pulse 59  SpO2 96%. Patient has an appointment today at 1:40.

## 2022-11-02 NOTE — Telephone Encounter (Signed)
Pt daughter called stating the pt has a little bit of shortness of breath, a cough, tired and her heart rate is fluctuating. Pt was not with daughter to get her triaged

## 2022-11-02 NOTE — Progress Notes (Signed)
Marikay Alar, MD Phone: [phone]  Frances Kent is a 89 y.o. female who presents today for same day visit.   Cough/fatigue: Notes cough has been going on for 2 to 3 weeks.  Notes it occurs mostly at night.  Has been more short of breath recently over the last 2 to 3 weeks.  She has chronic dyspnea.  Also notes some fatigue over the last week to 2 weeks.  She notes no postnasal drip, sore throat, congestion, or fevers.  No palpitations.  Notes her heart rate has ranged between 50s and 70s.  Notes is not uncommon for her heart rate to be in the 50s.  Patient does have a pacemaker and did recently see cardiology a little over a month ago.  Social History   Tobacco Use  Smoking Status Never  Smokeless Tobacco Never  Tobacco Comments   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 , worked at [employer], [employer]    Current Outpatient Medications on File Prior to Visit  Medication Sig Dispense Refill   acetaminophen (TYLENOL) 500 MG tablet Take 500 mg by mouth every 6 (six) hours as needed.     amiodarone (PACERONE) 200 MG tablet Take 0.5 tablets (100 mg total) by mouth daily. Home med.     benzonatate (TESSALON) 200 MG capsule Take 1 capsule (200 mg total) by mouth 2 (two) times daily as needed for cough. (Patient not taking: Reported on 09/29/2022) 60 capsule 0   clobetasol (TEMOVATE) 0.05 % external solution Apply 1 Application topically 2 (two) times daily. To scalp 50 mL 0   diltiazem (CARDIZEM CD) 180 MG 24 hr capsule Take 180 mg by mouth daily.     furosemide (LASIX) 20 MG tablet TAKE 1 TABLET BY MOUTH EVERY OTHER DAY 45 tablet 0   levalbuterol (XOPENEX) 0.63 MG/3ML nebulizer solution USE 1 VIAL  IN NEBULIZER EVERY 4 HOURS AS NEEDED FOR WHEEZING FOR SHORTNESS OF BREATH 120 mL 2   levothyroxine (SYNTHROID) 50 MCG tablet Take 1 tablet (50 mcg total) by mouth daily before breakfast. 90 tablet 1   mirtazapine (REMERON) 15 MG tablet TAKE 1 TABLET BY MOUTH AT BEDTIME 90 tablet 0   Multiple Vitamin  (MULTIVITAMIN WITH MINERALS) TABS tablet Take 1 tablet by mouth daily.     pantoprazole (PROTONIX) 20 MG tablet Take 1 tablet (20 mg total) by mouth 2 (two) times daily before a meal. 180 tablet 0   potassium chloride SA (KLOR-CON M) 20 MEQ tablet Take 20 mEq by mouth every other day.     triamcinolone (KENALOG) 0.025 % ointment Apply 1 Application topically 2 (two) times daily. To eyelids and area under eyes 30 g 0   XARELTO 15 MG TABS tablet TAKE 1 TABLET EVERY DAY 90 tablet 3   No current facility-administered medications on file prior to visit.     ROS see history of present illness  Objective  Physical Exam Vitals:   11/02/22 1416 11/02/22 1417  BP: 112/64   Pulse: (!) 58 (!) 54  Temp: 98.1 F (36.7 C)   SpO2: 94% 96%    BP Readings from Last 3 Encounters:  11/02/22 112/64  09/29/22 122/70  07/06/22 132/66   Wt Readings from Last 3 Encounters:  11/02/22 123 lb 9.6 oz (56.1 kg)  09/29/22 124 lb 3.2 oz (56.3 kg)  07/06/22 119 lb 12.8 oz (54.3 kg)    Physical Exam Constitutional:      General: She is not in acute distress.    Appearance: She is not  diaphoretic.  Cardiovascular:     Rate and Rhythm: Normal rate and regular rhythm.     Heart sounds: Normal heart sounds.  Pulmonary:     Effort: Pulmonary effort is normal.     Breath sounds: Normal breath sounds.  Skin:    General: Skin is warm and dry.  Neurological:     Mental Status: She is alert.      Assessment/Plan: Please see individual problem list.  Cough, unspecified type Assessment & Plan: Patient with new onset cough.  Patient certainly could have pneumonia or bronchitis.  COVID and flu testing was negative.  We will get a chest x-ray today to evaluate further.  Orders: -     POC COVID-19 BinaxNow -     POCT Influenza A/B -     CBC with Differential/Platelet -     DG Chest 2 View; Future  Chronic fatigue Assessment & Plan: Recent onset issue.  Could be related to what ever is causing her  cough.  Will check lab work to evaluate for other causes of fatigue.  Orders: -     TSH -     VITAMIN D 25 Hydroxy (Vit-D Deficiency, Fractures) -     Vitamin B12 -     Comprehensive metabolic panel    Return if symptoms worsen or fail to improve.   Marikay Alar, MD [facility]

## 2022-11-02 NOTE — Telephone Encounter (Signed)
Can someone please call the patient and triage her? Is this new onset or has this been going on a while? Is she having any other symptoms?

## 2022-11-02 NOTE — Assessment & Plan Note (Signed)
Patient with new onset cough.  Patient certainly could have pneumonia or bronchitis.  COVID and flu testing was negative.  We will get a chest x-ray today to evaluate further.

## 2022-11-02 NOTE — Telephone Encounter (Signed)
Noted.  Plan to evaluate patient in the office today.  Please check a flu and COVID test on her when she gets here.  Thanks.

## 2022-11-02 NOTE — Patient Instructions (Signed)
Nice to see you. We will get lab work and an x-ray today.  If you develop worsening shortness of breath or any fevers please be reevaluated.

## 2022-11-02 NOTE — Assessment & Plan Note (Signed)
Recent onset issue.  Could be related to what ever is causing her cough.  Will check lab work to evaluate for other causes of fatigue.

## 2022-11-03 LAB — CBC WITH DIFFERENTIAL/PLATELET
Basophils Absolute: 0.1 10*3/uL (ref 0.0–0.1)
Basophils Relative: 1.4 % (ref 0.0–3.0)
Eosinophils Absolute: 0.1 10*3/uL (ref 0.0–0.7)
Eosinophils Relative: 0.9 % (ref 0.0–5.0)
HCT: 38.2 % (ref 36.0–46.0)
Hemoglobin: 12.3 g/dL (ref 12.0–15.0)
Lymphocytes Relative: 14.9 % (ref 12.0–46.0)
Lymphs Abs: 1.1 10*3/uL (ref 0.7–4.0)
MCHC: 32.2 g/dL (ref 30.0–36.0)
MCV: 94.5 fl (ref 78.0–100.0)
Monocytes Absolute: 1.1 10*3/uL — ABNORMAL HIGH (ref 0.1–1.0)
Monocytes Relative: 15 % — ABNORMAL HIGH (ref 3.0–12.0)
Neutro Abs: 5.1 10*3/uL (ref 1.4–7.7)
Neutrophils Relative %: 67.8 % (ref 43.0–77.0)
Platelets: 228 10*3/uL (ref 150.0–400.0)
RBC: 4.04 Mil/uL (ref 3.87–5.11)
RDW: 15.9 % — ABNORMAL HIGH (ref 11.5–15.5)
WBC: 7.4 10*3/uL (ref 4.0–10.5)

## 2022-11-03 LAB — COMPREHENSIVE METABOLIC PANEL
ALT: 11 U/L (ref 0–35)
AST: 13 U/L (ref 0–37)
Albumin: 4.1 g/dL (ref 3.5–5.2)
Alkaline Phosphatase: 98 U/L (ref 39–117)
BUN: 20 mg/dL (ref 6–23)
CO2: 25 mEq/L (ref 19–32)
Calcium: 9.1 mg/dL (ref 8.4–10.5)
Chloride: 105 mEq/L (ref 96–112)
Creatinine, Ser: 1.25 mg/dL — ABNORMAL HIGH (ref 0.40–1.20)
GFR: 38.04 mL/min — ABNORMAL LOW (ref >60.00)
Glucose, Bld: 91 mg/dL (ref 70–99)
Potassium: 4.6 mEq/L (ref 3.5–5.1)
Sodium: 141 mEq/L (ref 135–145)
Total Bilirubin: 0.7 mg/dL (ref 0.2–1.2)
Total Protein: 6.9 g/dL (ref 6.0–8.3)

## 2022-11-03 LAB — VITAMIN D 25 HYDROXY (VIT D DEFICIENCY, FRACTURES): VITD: 41.87 ng/mL (ref 30.00–100.00)

## 2022-11-03 LAB — VITAMIN B12: Vitamin B-12: 496 pg/mL (ref 211–911)

## 2022-11-03 LAB — TSH: TSH: 3.22 u[IU]/mL (ref 0.35–5.50)

## 2022-11-04 ENCOUNTER — Other Ambulatory Visit: Payer: Self-pay | Admitting: Family Medicine

## 2022-11-04 MED ORDER — AMOXICILLIN-POT CLAVULANATE 500-125 MG PO TABS: 1.0000 | ORAL_TABLET | Freq: Two times a day (BID) | ORAL | 0 refills | Status: DC

## 2022-11-04 MED ORDER — DOXYCYCLINE HYCLATE 100 MG PO TABS
100.0000 mg | ORAL_TABLET | Freq: Two times a day (BID) | ORAL | 0 refills | Status: AC
Start: 2022-11-04 — End: ?

## 2022-11-06 ENCOUNTER — Other Ambulatory Visit: Payer: Self-pay | Admitting: Pulmonary Disease

## 2022-11-07 ENCOUNTER — Telehealth: Payer: Self-pay | Admitting: Family Medicine

## 2022-11-07 NOTE — Telephone Encounter (Signed)
Can you follow-up with the patient and see how she is doing after starting antibiotics?  Please let them know her x-ray is still not back yet.  Thanks.

## 2022-11-07 NOTE — Telephone Encounter (Signed)
Noted.  I think it would be a good idea to have her contact cardiology to get their input on the Cardizem.  That could be contributing to some of her fatigue.

## 2022-11-07 NOTE — Telephone Encounter (Signed)
Frances Kent is going to contact cardiology.

## 2022-11-07 NOTE — Telephone Encounter (Signed)
[name] the daughter states her Mom is still very weak and tired not sure if it is the sickness or the Cardizem. [name] is wondering if they should contact the cardiologist?

## 2022-11-10 DIAGNOSIS — Z950 Presence of cardiac pacemaker: Secondary | ICD-10-CM | POA: Diagnosis not present

## 2022-11-10 DIAGNOSIS — I341 Nonrheumatic mitral (valve) prolapse: Secondary | ICD-10-CM | POA: Diagnosis not present

## 2022-11-10 DIAGNOSIS — I071 Rheumatic tricuspid insufficiency: Secondary | ICD-10-CM | POA: Diagnosis not present

## 2022-11-10 DIAGNOSIS — Z789 Other specified health status: Secondary | ICD-10-CM | POA: Diagnosis not present

## 2022-11-10 DIAGNOSIS — E782 Mixed hyperlipidemia: Secondary | ICD-10-CM | POA: Diagnosis not present

## 2022-11-10 DIAGNOSIS — I4819 Other persistent atrial fibrillation: Secondary | ICD-10-CM | POA: Diagnosis not present

## 2022-11-10 DIAGNOSIS — R5383 Other fatigue: Secondary | ICD-10-CM | POA: Diagnosis not present

## 2022-11-10 DIAGNOSIS — I5020 Unspecified systolic (congestive) heart failure: Secondary | ICD-10-CM | POA: Diagnosis not present

## 2022-11-10 DIAGNOSIS — I10 Essential (primary) hypertension: Secondary | ICD-10-CM | POA: Diagnosis not present

## 2022-11-13 ENCOUNTER — Other Ambulatory Visit: Payer: Self-pay | Admitting: Internal Medicine

## 2022-11-16 ENCOUNTER — Encounter: Payer: Self-pay | Admitting: Family Medicine

## 2022-11-16 ENCOUNTER — Telehealth: Payer: Self-pay | Admitting: Internal Medicine

## 2022-11-16 ENCOUNTER — Ambulatory Visit (INDEPENDENT_AMBULATORY_CARE_PROVIDER_SITE_OTHER): Payer: Medicare HMO

## 2022-11-16 ENCOUNTER — Ambulatory Visit (INDEPENDENT_AMBULATORY_CARE_PROVIDER_SITE_OTHER): Payer: Medicare HMO | Admitting: Family Medicine

## 2022-11-16 VITALS — BP 110/66 | HR 69 | Temp 98.1°F | Ht 63.0 in | Wt 121.8 lb

## 2022-11-16 DIAGNOSIS — J189 Pneumonia, unspecified organism: Secondary | ICD-10-CM

## 2022-11-16 DIAGNOSIS — I517 Cardiomegaly: Secondary | ICD-10-CM | POA: Diagnosis not present

## 2022-11-16 DIAGNOSIS — R059 Cough, unspecified: Secondary | ICD-10-CM | POA: Diagnosis not present

## 2022-11-16 DIAGNOSIS — R053 Chronic cough: Secondary | ICD-10-CM | POA: Diagnosis not present

## 2022-11-16 DIAGNOSIS — I4891 Unspecified atrial fibrillation: Secondary | ICD-10-CM

## 2022-11-16 DIAGNOSIS — R918 Other nonspecific abnormal finding of lung field: Secondary | ICD-10-CM | POA: Diagnosis not present

## 2022-11-16 LAB — COMPREHENSIVE METABOLIC PANEL
ALT: 9 U/L (ref 0–35)
AST: 14 U/L (ref 0–37)
Albumin: 4 g/dL (ref 3.5–5.2)
Alkaline Phosphatase: 99 U/L (ref 39–117)
BUN: 15 mg/dL (ref 6–23)
CO2: 23 meq/L (ref 19–32)
Calcium: 9.2 mg/dL (ref 8.4–10.5)
Chloride: 103 meq/L (ref 96–112)
Creatinine, Ser: 1.22 mg/dL — ABNORMAL HIGH (ref 0.40–1.20)
GFR: 39.15 mL/min — ABNORMAL LOW (ref >60.00)
Glucose, Bld: 78 mg/dL (ref 70–99)
Potassium: 3.7 meq/L (ref 3.5–5.1)
Sodium: 138 meq/L (ref 135–145)
Total Bilirubin: 0.6 mg/dL (ref 0.2–1.2)
Total Protein: 7.5 g/dL (ref 6.0–8.3)

## 2022-11-16 LAB — CBC WITH DIFFERENTIAL/PLATELET
Basophils Absolute: 0.1 10*3/uL (ref 0.0–0.1)
Basophils Relative: 1.5 % (ref 0.0–3.0)
Eosinophils Absolute: 0.1 10*3/uL (ref 0.0–0.7)
Eosinophils Relative: 1.1 % (ref 0.0–5.0)
HCT: 38.3 % (ref 36.0–46.0)
Hemoglobin: 12.3 g/dL (ref 12.0–15.0)
Lymphocytes Relative: 15.2 % (ref 12.0–46.0)
Lymphs Abs: 1.2 10*3/uL (ref 0.7–4.0)
MCHC: 32.1 g/dL (ref 30.0–36.0)
MCV: 93.7 fL (ref 78.0–100.0)
Monocytes Absolute: 1 10*3/uL (ref 0.1–1.0)
Monocytes Relative: 12.4 % — ABNORMAL HIGH (ref 3.0–12.0)
Neutro Abs: 5.5 10*3/uL (ref 1.4–7.7)
Neutrophils Relative %: 69.8 % (ref 43.0–77.0)
Platelets: 312 10*3/uL (ref 150.0–400.0)
RBC: 4.09 Mil/uL (ref 3.87–5.11)
RDW: 15.3 % (ref 11.5–15.5)
WBC: 7.9 10*3/uL (ref 4.0–10.5)

## 2022-11-16 LAB — TSH: TSH: 3.62 u[IU]/mL (ref 0.35–5.50)

## 2022-11-16 NOTE — Assessment & Plan Note (Signed)
Continues to have some cough that was improved to a certain degree.  Given she still feels poorly and continued cough we will check another chest x-ray today to evaluate for persistent abnormalities.  If she has findings of pneumonia on chest x-ray we will treat with another antibiotic.  Also checking labs to evaluate for other underlying causes of her continuing to feel poorly.

## 2022-11-16 NOTE — Progress Notes (Signed)
Marikay Alar, MD Phone: [phone]  Frances Kent is a 89 y.o. female who presents today for f/u.  Cough: Patient continues to have some cough.  It is somewhat improved after treatment for possible pneumonia.  No congestion, fevers, postnasal drip, or sore throat.  She continues to feel tired and fatigued.  Her chest x-ray from over 2 weeks ago has not returned yet.  She notes in the past when she has had pneumonia she has had to have a second round of antibiotics.  Atrial fibrillation: Patient's heart rates have been up into the 120s at times.  She saw her cardiologist recently who was hesitant to alter her regimen given that she was sick.  They planned further evaluation if her symptoms persisted despite adequate treatment for her pneumonia.  Patient thinks her A-fib is likely contributing to her feeling poorly still.  Social History   Tobacco Use  Smoking Status Never  Smokeless Tobacco Never  Tobacco Comments   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 , worked at [employer], [employer]    Current Outpatient Medications on File Prior to Visit  Medication Sig Dispense Refill   acetaminophen (TYLENOL) 500 MG tablet Take 500 mg by mouth every 6 (six) hours as needed.     amiodarone (PACERONE) 200 MG tablet Take 0.5 tablets (100 mg total) by mouth daily. Home med.     benzonatate (TESSALON) 200 MG capsule Take 1 capsule (200 mg total) by mouth 2 (two) times daily as needed for cough. 60 capsule 0   clobetasol (TEMOVATE) 0.05 % external solution Apply 1 Application topically 2 (two) times daily. To scalp 50 mL 0   diltiazem (CARDIZEM CD) 180 MG 24 hr capsule Take 180 mg by mouth daily.     furosemide (LASIX) 20 MG tablet TAKE 1 TABLET BY MOUTH EVERY OTHER DAY 45 tablet 0   levalbuterol (XOPENEX) 0.63 MG/3ML nebulizer solution USE 1  VIAL IN NEBULIZER EVERY 4 HOURS AS NEEDED FOR WHEEZING AND FOR SHORTNESS OF BREATH 150 mL 0   levothyroxine (SYNTHROID) 50 MCG tablet TAKE 1 TABLET BY MOUTH ONCE DAILY BEFORE  BREAKFAST 90 tablet 0   mirtazapine (REMERON) 15 MG tablet TAKE 1 TABLET BY MOUTH AT BEDTIME 90 tablet 0   Multiple Vitamin (MULTIVITAMIN WITH MINERALS) TABS tablet Take 1 tablet by mouth daily.     potassium chloride SA (KLOR-CON M) 20 MEQ tablet Take 20 mEq by mouth every other day.     triamcinolone (KENALOG) 0.025 % ointment Apply 1 Application topically 2 (two) times daily. To eyelids and area under eyes 30 g 0   XARELTO 15 MG TABS tablet TAKE 1 TABLET EVERY DAY 90 tablet 3   pantoprazole (PROTONIX) 20 MG tablet Take 1 tablet (20 mg total) by mouth 2 (two) times daily before a meal. 180 tablet 0   No current facility-administered medications on file prior to visit.     ROS see history of present illness  Objective  Physical Exam Vitals:   11/16/22 0956  BP: 110/66  Pulse: 69  Temp: 98.1 F (36.7 C)  SpO2: 99%    BP Readings from Last 3 Encounters:  11/16/22 110/66  11/02/22 112/64  09/29/22 122/70   Wt Readings from Last 3 Encounters:  11/16/22 121 lb 12.8 oz (55.2 kg)  11/02/22 123 lb 9.6 oz (56.1 kg)  09/29/22 124 lb 3.2 oz (56.3 kg)    Physical Exam Constitutional:      General: She is not in acute distress.    Appearance:  She is not diaphoretic.  Cardiovascular:     Rate and Rhythm: Tachycardia present. Rhythm irregularly irregular.     Heart sounds: Normal heart sounds.  Pulmonary:     Effort: Pulmonary effort is normal.     Breath sounds: Normal breath sounds.  Skin:    General: Skin is warm and dry.  Neurological:     Mental Status: She is alert.    EKG: Atrial fibrillation, rate 114, right bundle branch block with left axis, no apparent ischemic changes  Assessment/Plan: Please see individual problem list.  Cough, unspecified type Assessment & Plan: Continues to have some cough that was improved to a certain degree.  Given she still feels poorly and continued cough we will check another chest x-ray today to evaluate for persistent abnormalities.   If she has findings of pneumonia on chest x-ray we will treat with another antibiotic.  Also checking labs to evaluate for other underlying causes of her continuing to feel poorly.   Community acquired pneumonia, unspecified laterality -     Comprehensive metabolic panel -     CBC with Differential/Platelet -     DG Chest 2 View; Future  Atrial fibrillation status post cardioversion [facility]) Assessment & Plan: Chronic issue with exacerbation.  Patient continues to be in A-fib.  Tachycardic on exam today.  EKG performed revealing A-fib with elevated heart rate though not too elevated.  Discussed following up with cardiology if lab evaluation and x-ray do not reveal an underlying cause for her persistently elevated heart rates.  Orders: -     EKG 12-Lead -     TSH    Return in about 1 week (around 11/23/2022) for recheck from being sick.   Marikay Alar, MD [facility]

## 2022-11-16 NOTE — Telephone Encounter (Signed)
[name] just tried to call back. Can someone call her in the morning about her mom's xrays. The number is [phone]

## 2022-11-16 NOTE — Patient Instructions (Signed)
Nice to see you. Will get lab work and an x-ray today and contact you with the results. We will see you back in about a week.  If you start to feel worse at all please get reevaluated.

## 2022-11-16 NOTE — Telephone Encounter (Signed)
Spoke to [facility] the daughter one time but she was supposed to call back after she spoke to her mother for me.

## 2022-11-16 NOTE — Assessment & Plan Note (Addendum)
Chronic issue with exacerbation.  Patient continues to be in A-fib.  Tachycardic on exam today.  EKG performed revealing A-fib with elevated heart rate though not too elevated.  Discussed following up with cardiology if lab evaluation and x-ray do not reveal an underlying cause for her persistently elevated heart rates.

## 2022-11-16 NOTE — Telephone Encounter (Signed)
Pt daughter would like to be called concerning the pt lung xrays

## 2022-11-17 ENCOUNTER — Telehealth: Payer: Self-pay

## 2022-11-17 DIAGNOSIS — I480 Paroxysmal atrial fibrillation: Secondary | ICD-10-CM | POA: Diagnosis not present

## 2022-11-17 DIAGNOSIS — Z23 Encounter for immunization: Secondary | ICD-10-CM | POA: Diagnosis not present

## 2022-11-17 MED ORDER — DOXYCYCLINE HYCLATE 100 MG PO TABS
100.0000 mg | ORAL_TABLET | Freq: Two times a day (BID) | ORAL | 0 refills | Status: DC
Start: 2022-11-17 — End: 2022-11-22

## 2022-11-17 MED ORDER — AMOXICILLIN-POT CLAVULANATE 500-125 MG PO TABS: 1.0000 | ORAL_TABLET | Freq: Two times a day (BID) | ORAL | 0 refills | Status: DC

## 2022-11-17 NOTE — Telephone Encounter (Signed)
[name], Windell Hummingbird, would like for Sandy Salaam, CMA, to call her back regarding patient's recent episode.

## 2022-11-17 NOTE — Telephone Encounter (Signed)
Spoke to [facility] the daughter with Dr. Purvis Sheffield recommendations and she understands and is agreeable.

## 2022-11-17 NOTE — Telephone Encounter (Signed)
The area noted to be possible bronchitis could also be related to some fluid retention that could be a result of her afib. The area noted to be possible bronchitis has been present on x-ray since earlier this year which would make it less likely related to an acute infectious process. It is good they are seeing Dr Darrold Junker today to get his input. I will send in another course of antibiotics for her to start on to cover for possible persistent infection as long as Dr Darrold Junker does not think this is related to her heart. If he thinks this is related to her heart I would hold off on taking the antibiotic for now.

## 2022-11-17 NOTE — Telephone Encounter (Signed)
Spoke to [facility] the daughter her mother is no better. Patient has no appetite and her cough is getting worse with the cough pearls so the Patient has stopped the cough pearls to see how the cough is without them. Frances Kent states her mother is not having SOB at night. Patient does see Dr. Evette Georges today at 3:15. Family is concerned due to the xray result saying maybe there is some bronchitis. Does Patient need more medication is what they wanted to know.

## 2022-11-17 NOTE — Telephone Encounter (Signed)
Pt returning a call to the cma

## 2022-11-17 NOTE — Telephone Encounter (Signed)
Spoke with pt's daughter and she stated that no one has called her back to let her know if Dr. Birdie Sons sent her mother in an antibiotic. I explained to daughter that the antibiotic has been sent in but she was upset because no one had called to let her know.

## 2022-11-17 NOTE — Addendum Note (Signed)
Addended by: Birdie Sons [place] Krotz G on: 11/17/2022 12:47 PM   Modules accepted: Orders

## 2022-11-18 NOTE — Telephone Encounter (Signed)
Called [name] to see if they can come in on 11/22/22 at 2:30 please confirm if this okay for recheck on the bronchitis.

## 2022-11-18 NOTE — Telephone Encounter (Signed)
Patient confirmed appointment.

## 2022-11-18 NOTE — Telephone Encounter (Signed)
Spoke to daughter Elita Quick and she said Dr. Darrold Junker said he can not find anything related to the heart he believes it is bronchitis so they are picking the prescription up today. Patient's sister passed so yesterday they was at the funeral home for that.

## 2022-11-21 ENCOUNTER — Other Ambulatory Visit: Payer: Self-pay | Admitting: Internal Medicine

## 2022-11-22 ENCOUNTER — Encounter: Payer: Self-pay | Admitting: Internal Medicine

## 2022-11-22 ENCOUNTER — Ambulatory Visit (INDEPENDENT_AMBULATORY_CARE_PROVIDER_SITE_OTHER): Payer: Medicare HMO | Admitting: Internal Medicine

## 2022-11-22 VITALS — BP 120/80 | HR 136 | Ht 63.0 in | Wt 121.0 lb

## 2022-11-22 DIAGNOSIS — I4891 Unspecified atrial fibrillation: Secondary | ICD-10-CM | POA: Diagnosis not present

## 2022-11-22 DIAGNOSIS — J690 Pneumonitis due to inhalation of food and vomit: Secondary | ICD-10-CM | POA: Diagnosis not present

## 2022-11-22 DIAGNOSIS — R051 Acute cough: Secondary | ICD-10-CM

## 2022-11-22 DIAGNOSIS — T3695XA Adverse effect of unspecified systemic antibiotic, initial encounter: Secondary | ICD-10-CM | POA: Diagnosis not present

## 2022-11-22 DIAGNOSIS — K521 Toxic gastroenteritis and colitis: Secondary | ICD-10-CM | POA: Diagnosis not present

## 2022-11-22 DIAGNOSIS — R0602 Shortness of breath: Secondary | ICD-10-CM

## 2022-11-22 MED ORDER — DILTIAZEM HCL ER 240 MG PO TB24: 240.0000 mg | ORAL_TABLET | Freq: Every day | ORAL | 2 refills | Status: DC

## 2022-11-22 MED ORDER — BENZONATATE 200 MG PO CAPS
200.0000 mg | ORAL_CAPSULE | Freq: Three times a day (TID) | ORAL | 0 refills | Status: DC | PRN
Start: 2022-11-22 — End: 2023-02-09

## 2022-11-22 NOTE — Assessment & Plan Note (Addendum)
She has had liquid stools for 4 or 5 days,  waking her from sleep  ,  and feels fatigued. Need to rule out  c dificile colitis.

## 2022-11-22 NOTE — Progress Notes (Signed)
Subjective:  Patient ID: Frances Kent, female    DOB: [birth year]  Age: 89 y.o. MRN: [id number]  CC: The primary encounter diagnosis was Antibiotic-associated diarrhea. Diagnoses of Acute cough, Shortness of breath, Aspiration pneumonia of right middle lobe, unspecified aspiration pneumonia type [facility]), and Atrial fibrillation status post cardioversion [facility]) were also pertinent to this visit.   HPI Frances Kent presents for  Chief Complaint  Patient presents with   Medical Management of Chronic Issues   89 YR OLD  nonsmoker with ILD,   GERD with dysphagia and recurrent silent aspiration  CHRONIC ATRIAL FIBRILLATION ON AMIODARONE ,  and CHG presents with PERSISTENT COUGH SINCE MID SEPTEMBER .  Treated  by Dr Birdie Sons for CAP when  Initial  chest x ray noted RML opacity,  symptoms did not improve,  repeat chest x ray noted improved aeration  in RML (see below)   "Similar coarsened interstitial markings throughout both lungs may reflect bronchitis or pulmonary interstitial edema. 2. Improved opacities projecting over the right midlung since the most recent prior study. No new or worsening focal airspace consolidation or pleural effusion."   She started a second round  of augmentin/doxy on Oct 3. And has been having liquid stools 4-5 daily since the first round of antibiotics but has not told anyone.  The diarrhea is waking her from sleep, and she has felt nauseated and dry heaved several times.   She has had labile  pulse as high as 130 at rest and never below of 80 .  She was seen by Dr Darrold Junker on October 3, diltiazem dose was supposed to be increased to 240 mg daily but not done  .   Outpatient Medications Prior to Visit  Medication Sig Dispense Refill   acetaminophen (TYLENOL) 500 MG tablet Take 500 mg by mouth every 6 (six) hours as needed.     amiodarone (PACERONE) 200 MG tablet Take 0.5 tablets (100 mg total) by mouth daily. Home med.     clobetasol (TEMOVATE) 0.05 % external  solution Apply 1 Application topically 2 (two) times daily. To scalp 50 mL 0   diltiazem (CARDIZEM CD) 180 MG 24 hr capsule Take 180 mg by mouth daily.     furosemide (LASIX) 20 MG tablet TAKE 1 TABLET BY MOUTH EVERY OTHER DAY 45 tablet 0   levalbuterol (XOPENEX) 0.63 MG/3ML nebulizer solution USE 1  VIAL IN NEBULIZER EVERY 4 HOURS AS NEEDED FOR WHEEZING AND FOR SHORTNESS OF BREATH 150 mL 0   levothyroxine (SYNTHROID) 50 MCG tablet TAKE 1 TABLET BY MOUTH ONCE DAILY BEFORE BREAKFAST 90 tablet 0   mirtazapine (REMERON) 15 MG tablet TAKE 1 TABLET BY MOUTH AT BEDTIME 90 tablet 0   Multiple Vitamin (MULTIVITAMIN WITH MINERALS) TABS tablet Take 1 tablet by mouth daily.     potassium chloride SA (KLOR-CON M) 20 MEQ tablet Take 20 mEq by mouth every other day.     triamcinolone (KENALOG) 0.025 % ointment Apply 1 Application topically 2 (two) times daily. To eyelids and area under eyes 30 g 0   XARELTO 15 MG TABS tablet TAKE 1 TABLET EVERY DAY 90 tablet 3   amoxicillin-clavulanate (AUGMENTIN) 500-125 MG tablet Take 1 tablet by mouth in the morning and at bedtime. 14 tablet 0   benzonatate (TESSALON) 200 MG capsule Take 1 capsule (200 mg total) by mouth 2 (two) times daily as needed for cough. 60 capsule 0   doxycycline (VIBRA-TABS) 100 MG tablet Take 1 tablet (100 mg  total) by mouth 2 (two) times daily. 14 tablet 0   pantoprazole (PROTONIX) 20 MG tablet Take 1 tablet (20 mg total) by mouth 2 (two) times daily before a meal. 180 tablet 0   No facility-administered medications prior to visit.    Review of Systems;  Patient denies headache, fevers, malaise, unintentional weight loss, skin rash, eye pain, sinus congestion and sinus pain, sore throat, dysphagia,  hemoptysis , cough, dyspnea, wheezing, chest pain, palpitations, orthopnea, edema, abdominal pain, nausea, melena, diarrhea, constipation, flank pain, dysuria, hematuria, urinary  Frequency, nocturia, numbness, tingling, seizures,  Focal weakness,  Loss of consciousness,  Tremor, insomnia, depression, anxiety, and suicidal ideation.      Objective:  BP 120/80   Pulse (!) 136   Ht 5\' 3"  (1.6 m)   Wt 121 lb (54.9 kg)   SpO2 94%   BMI 21.43 kg/m   BP Readings from Last 3 Encounters:  11/22/22 120/80  11/16/22 110/66  11/02/22 112/64    Wt Readings from Last 3 Encounters:  11/22/22 121 lb (54.9 kg)  11/16/22 121 lb 12.8 oz (55.2 kg)  11/02/22 123 lb 9.6 oz (56.1 kg)    Physical Exam Vitals reviewed.  Constitutional:      General: She is not in acute distress.    Appearance: Normal appearance. She is normal weight. She is ill-appearing. She is not toxic-appearing or diaphoretic.  HENT:     Head: Normocephalic.  Eyes:     General: No scleral icterus.       Right eye: No discharge.        Left eye: No discharge.     Conjunctiva/sclera: Conjunctivae normal.  Cardiovascular:     Rate and Rhythm: Normal rate and regular rhythm.     Heart sounds: Normal heart sounds.  Pulmonary:     Effort: Pulmonary effort is normal. No respiratory distress.     Breath sounds: Normal breath sounds.  Musculoskeletal:        General: Normal range of motion.  Skin:    General: Skin is warm and dry.     Comments: Tenting   Neurological:     General: No focal deficit present.     Mental Status: She is alert and oriented to person, place, and time. Mental status is at baseline.  Psychiatric:        Mood and Affect: Mood normal.        Behavior: Behavior normal.        Thought Content: Thought content normal.        Judgment: Judgment normal.    Lab Results  Component Value Date   HGBA1C 5.8 (H) 11/02/2020   HGBA1C 6.1 (H) 10/16/2020    Lab Results  Component Value Date   CREATININE 1.22 (H) 11/16/2022   CREATININE 1.25 (H) 11/02/2022   CREATININE 1.16 07/06/2022    Lab Results  Component Value Date   WBC 7.9 11/16/2022   HGB 12.3 11/16/2022   HCT 38.3 11/16/2022   PLT 312.0 11/16/2022   GLUCOSE 78 11/16/2022   CHOL  257 (H) 11/23/2021   TRIG 104.0 11/23/2021   HDL 96.40 11/23/2021   LDLDIRECT 185.3 03/27/2013   LDLCALC 140 (H) 11/23/2021   ALT 9 11/16/2022   AST 14 11/16/2022   NA 138 11/16/2022   K 3.7 11/16/2022   CL 103 11/16/2022   CREATININE 1.22 (H) 11/16/2022   BUN 15 11/16/2022   CO2 23 11/16/2022   TSH 3.62 11/16/2022   INR 3.0 (H)  10/15/2020   HGBA1C 5.8 (H) 11/02/2020    DG Chest 2 View  Result Date: 06/27/2022 CLINICAL DATA:  Cough EXAM: CHEST - 2 VIEW COMPARISON:  03/10/2021 FINDINGS: Loop recorder device overlies the left chest. Cardiomegaly. Patchy airspace opacity within the left upper lobe. The right lung appears clear. No pleural effusion or pneumothorax. Slightly progressive height loss of a chronic compression fracture of the T11 vertebral body. IMPRESSION: 1. Patchy airspace opacity within the left upper lobe, suspicious for pneumonia. 2. Slightly progressive height loss of a chronic compression fracture of the T11 vertebral body. Correlate with point tenderness. Electronically Signed   By: Duanne Guess D.O.   On: 06/27/2022 16:23    Assessment & Plan:  .Antibiotic-associated diarrhea Assessment & Plan: She has had liquid stools for 4 or 5 days,  waking her from sleep  ,  and feels fatigued. Need to rule out  c dificile colitis.   Orders: -     C Difficile Quick Screen w PCR reflex; Future -     CBC with Differential/Platelet -     Magnesium -     Basic metabolic panel  Acute cough -     Benzonatate; Take 1 capsule (200 mg total) by mouth 3 (three) times daily as needed for cough.  Dispense: 60 capsule; Refill: 0  Shortness of breath -     Brain natriuretic peptide  Aspiration pneumonia of right middle lobe, unspecified aspiration pneumonia type [facility]) Assessment & Plan: Suggested by RML opacity,  which has improved with follow up chest  ray    Atrial fibrillation status post cardioversion [facility]) Assessment & Plan: Rate has been labile,  likely aggravated by  her dehydration . Will increase her dose of diltiazem to 240 mg daily    Other orders -     dilTIAZem HCl ER; Take 1 tablet (240 mg total) by mouth daily.  Dispense: 30 tablet; Refill: 2     I provided 47 minutes of face-to-face time during this encounter reviewing patient's last visit with me, patient's  most recent visit with Dr Birdie Sons,  Dr. Darrold Junker,  Dr. Jayme Cloud, recent surgical and non surgical procedures, previous  labs and imaging studies, counseling on currently addressed issues,  and post visit ordering to diagnostics and therapeutics .   Follow-up: Return in about 2 weeks (around 12/06/2022).   Sherlene Shams, MD

## 2022-11-22 NOTE — Assessment & Plan Note (Signed)
Rate has been labile,  likely aggravated by her dehydration . Will increase her dose of diltiazem to 240 mg daily

## 2022-11-22 NOTE — Patient Instructions (Addendum)
Stop the antibiotics,  they are not helping  I want you to  collect a LIQUID stool sample  using the test kit provided today and take it to the [facility] the [facility] in  [facility]  to test for c dificile colitis    Please start taking a probiotic ( Align, [place], [employer]  or [place])  today . This may help to  prevent a serious antibiotic associated diarrhea  Called clostridium dificile colitis and a vaginal yeast infection    You are dehydrated .  Increase  your water and gatorade intake     I have sent the higher dose of diltiazem to  [facility] to start (240 mg )

## 2022-11-22 NOTE — Assessment & Plan Note (Signed)
Suggested by RML opacity,  which has improved with follow up chest  ray

## 2022-11-23 LAB — CBC WITH DIFFERENTIAL/PLATELET
Basophils Absolute: 0.1 10*3/uL (ref 0.0–0.1)
Basophils Relative: 0.9 % (ref 0.0–3.0)
Eosinophils Absolute: 0.1 10*3/uL (ref 0.0–0.7)
Eosinophils Relative: 0.7 % (ref 0.0–5.0)
HCT: 37.1 % (ref 36.0–46.0)
Hemoglobin: 11.7 g/dL — ABNORMAL LOW (ref 12.0–15.0)
Lymphocytes Relative: 11.1 % — ABNORMAL LOW (ref 12.0–46.0)
Lymphs Abs: 1 10*3/uL (ref 0.7–4.0)
MCHC: 31.5 g/dL (ref 30.0–36.0)
MCV: 93.7 fL (ref 78.0–100.0)
Monocytes Absolute: 0.7 10*3/uL (ref 0.1–1.0)
Monocytes Relative: 7.8 % (ref 3.0–12.0)
Neutro Abs: 6.9 10*3/uL (ref 1.4–7.7)
Neutrophils Relative %: 79.5 % — ABNORMAL HIGH (ref 43.0–77.0)
Platelets: 355 10*3/uL (ref 150.0–400.0)
RBC: 3.96 Mil/uL (ref 3.87–5.11)
RDW: 14.9 % (ref 11.5–15.5)
WBC: 8.6 10*3/uL (ref 4.0–10.5)

## 2022-11-23 LAB — BASIC METABOLIC PANEL
BUN: 13 mg/dL (ref 6–23)
CO2: 24 meq/L (ref 19–32)
Calcium: 9.3 mg/dL (ref 8.4–10.5)
Chloride: 106 meq/L (ref 96–112)
Creatinine, Ser: 1.2 mg/dL (ref 0.40–1.20)
GFR: 39.93 mL/min — ABNORMAL LOW (ref >60.00)
Glucose, Bld: 100 mg/dL — ABNORMAL HIGH (ref 70–99)
Potassium: 4.5 meq/L (ref 3.5–5.1)
Sodium: 139 meq/L (ref 135–145)

## 2022-11-23 LAB — MAGNESIUM: Magnesium: 1.9 mg/dL (ref 1.5–2.5)

## 2022-11-23 LAB — BRAIN NATRIURETIC PEPTIDE: Brain Natriuretic Peptide: 383 pg/mL — ABNORMAL HIGH (ref <100)

## 2022-11-24 ENCOUNTER — Telehealth: Payer: Self-pay

## 2022-11-24 ENCOUNTER — Other Ambulatory Visit: Payer: Self-pay | Admitting: Internal Medicine

## 2022-11-24 ENCOUNTER — Other Ambulatory Visit
Admission: RE | Admit: 2022-11-24 | Discharge: 2022-11-24 | Disposition: A | Discharge status: Home / Self Care | Payer: Medicare HMO | Source: Ambulatory Visit | Attending: Internal Medicine | Admitting: Internal Medicine

## 2022-11-24 DIAGNOSIS — R0902 Hypoxemia: Secondary | ICD-10-CM | POA: Diagnosis present

## 2022-11-24 DIAGNOSIS — I509 Heart failure, unspecified: Secondary | ICD-10-CM | POA: Diagnosis not present

## 2022-11-24 DIAGNOSIS — I480 Paroxysmal atrial fibrillation: Secondary | ICD-10-CM | POA: Diagnosis present

## 2022-11-24 DIAGNOSIS — Z79899 Other long term (current) drug therapy: Secondary | ICD-10-CM | POA: Diagnosis not present

## 2022-11-24 DIAGNOSIS — Z79890 Hormone replacement therapy (postmenopausal): Secondary | ICD-10-CM | POA: Diagnosis not present

## 2022-11-24 DIAGNOSIS — Z8249 Family history of ischemic heart disease and other diseases of the circulatory system: Secondary | ICD-10-CM | POA: Diagnosis not present

## 2022-11-24 DIAGNOSIS — I340 Nonrheumatic mitral (valve) insufficiency: Secondary | ICD-10-CM | POA: Diagnosis not present

## 2022-11-24 DIAGNOSIS — K219 Gastro-esophageal reflux disease without esophagitis: Secondary | ICD-10-CM | POA: Diagnosis present

## 2022-11-24 DIAGNOSIS — T3695XA Adverse effect of unspecified systemic antibiotic, initial encounter: Secondary | ICD-10-CM | POA: Insufficient documentation

## 2022-11-24 DIAGNOSIS — R918 Other nonspecific abnormal finding of lung field: Secondary | ICD-10-CM | POA: Diagnosis not present

## 2022-11-24 DIAGNOSIS — I5023 Acute on chronic systolic (congestive) heart failure: Secondary | ICD-10-CM | POA: Diagnosis not present

## 2022-11-24 DIAGNOSIS — D6869 Other thrombophilia: Secondary | ICD-10-CM

## 2022-11-24 DIAGNOSIS — N1832 Chronic kidney disease, stage 3b: Secondary | ICD-10-CM | POA: Diagnosis not present

## 2022-11-24 DIAGNOSIS — Z7189 Other specified counseling: Secondary | ICD-10-CM | POA: Diagnosis not present

## 2022-11-24 DIAGNOSIS — R0602 Shortness of breath: Secondary | ICD-10-CM | POA: Diagnosis not present

## 2022-11-24 DIAGNOSIS — I495 Sick sinus syndrome: Secondary | ICD-10-CM | POA: Diagnosis not present

## 2022-11-24 DIAGNOSIS — Z950 Presence of cardiac pacemaker: Secondary | ICD-10-CM | POA: Diagnosis not present

## 2022-11-24 DIAGNOSIS — Z1152 Encounter for screening for COVID-19: Secondary | ICD-10-CM | POA: Diagnosis not present

## 2022-11-24 DIAGNOSIS — Z823 Family history of stroke: Secondary | ICD-10-CM | POA: Diagnosis not present

## 2022-11-24 DIAGNOSIS — I452 Bifascicular block: Secondary | ICD-10-CM | POA: Diagnosis present

## 2022-11-24 DIAGNOSIS — E034 Atrophy of thyroid (acquired): Secondary | ICD-10-CM | POA: Diagnosis not present

## 2022-11-24 DIAGNOSIS — E785 Hyperlipidemia, unspecified: Secondary | ICD-10-CM | POA: Diagnosis present

## 2022-11-24 DIAGNOSIS — I130 Hypertensive heart and chronic kidney disease with heart failure and stage 1 through stage 4 chronic kidney disease, or unspecified chronic kidney disease: Secondary | ICD-10-CM | POA: Diagnosis not present

## 2022-11-24 DIAGNOSIS — Z8673 Personal history of transient ischemic attack (TIA), and cerebral infarction without residual deficits: Secondary | ICD-10-CM | POA: Diagnosis not present

## 2022-11-24 DIAGNOSIS — I482 Chronic atrial fibrillation, unspecified: Secondary | ICD-10-CM | POA: Diagnosis not present

## 2022-11-24 DIAGNOSIS — I5022 Chronic systolic (congestive) heart failure: Secondary | ICD-10-CM

## 2022-11-24 DIAGNOSIS — O219 Vomiting of pregnancy, unspecified: Secondary | ICD-10-CM

## 2022-11-24 DIAGNOSIS — F39 Unspecified mood [affective] disorder: Secondary | ICD-10-CM | POA: Diagnosis not present

## 2022-11-24 DIAGNOSIS — I4891 Unspecified atrial fibrillation: Secondary | ICD-10-CM | POA: Diagnosis not present

## 2022-11-24 DIAGNOSIS — Z803 Family history of malignant neoplasm of breast: Secondary | ICD-10-CM | POA: Diagnosis not present

## 2022-11-24 DIAGNOSIS — Z86711 Personal history of pulmonary embolism: Secondary | ICD-10-CM | POA: Diagnosis not present

## 2022-11-24 DIAGNOSIS — J168 Pneumonia due to other specified infectious organisms: Secondary | ICD-10-CM | POA: Diagnosis not present

## 2022-11-24 DIAGNOSIS — N179 Acute kidney failure, unspecified: Secondary | ICD-10-CM | POA: Diagnosis not present

## 2022-11-24 DIAGNOSIS — R531 Weakness: Secondary | ICD-10-CM | POA: Diagnosis not present

## 2022-11-24 DIAGNOSIS — Z7901 Long term (current) use of anticoagulants: Secondary | ICD-10-CM | POA: Diagnosis not present

## 2022-11-24 DIAGNOSIS — K521 Toxic gastroenteritis and colitis: Secondary | ICD-10-CM

## 2022-11-24 LAB — C DIFFICILE QUICK SCREEN W PCR REFLEX
C Diff antigen: NEGATIVE
C Diff interpretation: NOT DETECTED
C Diff toxin: NEGATIVE

## 2022-11-24 NOTE — Telephone Encounter (Signed)
[facility] team  Please call patient and get more information in regards to nausea.  Is it nausea as it relates to eating meals? Does not improve with eating or worsen? if she taking Protonix 20 mg twice daily as prescribed?  Does she have any burping or epigastric burning?  I would recommend more natural measures including ginger, ginger lozenges or chews.  Dramamine makes a nausea 'ginger chew' over the counter.  Encourage small frequent sips of ginger ale  There is a drug interaction between Zofran and amiodarone. Phenergan may be an option however I am concerned with level of sedation.  I will wait to send in potentially Phenergan until after you have spoken with caregiver  I have also shared this Dr Darrick Huntsman who is out of the office as she knows patient well and she may have other recommendations  GFR 39

## 2022-11-24 NOTE — Telephone Encounter (Signed)
Spoke with [name] to advise her of Margaret's message below and [name] has decided that she will try the ginger chews. [name] also asked about the oxygen and I advised her that since the patient's oxygen level is staying around 95% she would not qualify for oxygen and insurance would not pay for it. [name] asked what she should do if her mother doesn't start getting better. I advised [name] that if she doesn't see any improvement in her or she gets worse she may need to take her to the ED. [name] agreed stating that her mom is just so weak.

## 2022-11-24 NOTE — Telephone Encounter (Signed)
Pt's daughter called back and stated that pt is still not feeling good and was having some SOBr during the night but O2 is staying around 95%. Daughter is wanting to know if maybe some oxygen would help the pt. She also stated that they are waiting on palliative care to come help during the day. Looked in the chart and I do not see a referral for palliative care.

## 2022-11-24 NOTE — Telephone Encounter (Signed)
[name], Gwendalyn Ege, called to state patient is still not feeling good at all.  [name] is wondering if some oxygen would help her.  [name] states patient is occasionally short of breath.  I transferred call to Access Nurse.

## 2022-11-24 NOTE — Telephone Encounter (Signed)
[name] just called and asked could she get something prescribed for her mom for nausea. The pharmacy she uses is [facility] [address] [place] - [address] [place] [address] Phone: [phone]  Fax: [phone]

## 2022-11-25 NOTE — Telephone Encounter (Signed)
Attempted to call. No answer no voicemail. 

## 2022-11-26 ENCOUNTER — Other Ambulatory Visit: Payer: Self-pay

## 2022-11-26 ENCOUNTER — Inpatient Hospital Stay
Admission: EM | Admit: 2022-11-26 | Discharge: 2022-11-29 | DRG: 291 | Disposition: A | Discharge status: Home Health | Payer: Medicare HMO | Attending: Internal Medicine | Admitting: Internal Medicine

## 2022-11-26 ENCOUNTER — Emergency Department: Payer: Medicare HMO

## 2022-11-26 DIAGNOSIS — Z9049 Acquired absence of other specified parts of digestive tract: Secondary | ICD-10-CM

## 2022-11-26 DIAGNOSIS — Z8249 Family history of ischemic heart disease and other diseases of the circulatory system: Secondary | ICD-10-CM

## 2022-11-26 DIAGNOSIS — I495 Sick sinus syndrome: Secondary | ICD-10-CM | POA: Diagnosis present

## 2022-11-26 DIAGNOSIS — I5023 Acute on chronic systolic (congestive) heart failure: Secondary | ICD-10-CM | POA: Diagnosis not present

## 2022-11-26 DIAGNOSIS — Z8673 Personal history of transient ischemic attack (TIA), and cerebral infarction without residual deficits: Secondary | ICD-10-CM

## 2022-11-26 DIAGNOSIS — Z79899 Other long term (current) drug therapy: Secondary | ICD-10-CM | POA: Diagnosis not present

## 2022-11-26 DIAGNOSIS — Z823 Family history of stroke: Secondary | ICD-10-CM | POA: Diagnosis not present

## 2022-11-26 DIAGNOSIS — F39 Unspecified mood [affective] disorder: Secondary | ICD-10-CM | POA: Diagnosis present

## 2022-11-26 DIAGNOSIS — I480 Paroxysmal atrial fibrillation: Secondary | ICD-10-CM | POA: Diagnosis present

## 2022-11-26 DIAGNOSIS — J189 Pneumonia, unspecified organism: Secondary | ICD-10-CM

## 2022-11-26 DIAGNOSIS — R54 Age-related physical debility: Secondary | ICD-10-CM | POA: Diagnosis present

## 2022-11-26 DIAGNOSIS — R531 Weakness: Secondary | ICD-10-CM | POA: Diagnosis present

## 2022-11-26 DIAGNOSIS — R0902 Hypoxemia: Secondary | ICD-10-CM | POA: Diagnosis present

## 2022-11-26 DIAGNOSIS — I452 Bifascicular block: Secondary | ICD-10-CM | POA: Diagnosis present

## 2022-11-26 DIAGNOSIS — I340 Nonrheumatic mitral (valve) insufficiency: Secondary | ICD-10-CM | POA: Diagnosis present

## 2022-11-26 DIAGNOSIS — E034 Atrophy of thyroid (acquired): Secondary | ICD-10-CM | POA: Diagnosis present

## 2022-11-26 DIAGNOSIS — I130 Hypertensive heart and chronic kidney disease with heart failure and stage 1 through stage 4 chronic kidney disease, or unspecified chronic kidney disease: Principal | ICD-10-CM | POA: Diagnosis present

## 2022-11-26 DIAGNOSIS — Z79890 Hormone replacement therapy (postmenopausal): Secondary | ICD-10-CM | POA: Diagnosis not present

## 2022-11-26 DIAGNOSIS — R9431 Abnormal electrocardiogram [ECG] [EKG]: Secondary | ICD-10-CM | POA: Diagnosis present

## 2022-11-26 DIAGNOSIS — Z86711 Personal history of pulmonary embolism: Secondary | ICD-10-CM

## 2022-11-26 DIAGNOSIS — I509 Heart failure, unspecified: Secondary | ICD-10-CM | POA: Diagnosis not present

## 2022-11-26 DIAGNOSIS — K219 Gastro-esophageal reflux disease without esophagitis: Secondary | ICD-10-CM | POA: Diagnosis present

## 2022-11-26 DIAGNOSIS — I482 Chronic atrial fibrillation, unspecified: Secondary | ICD-10-CM | POA: Diagnosis present

## 2022-11-26 DIAGNOSIS — N1831 Chronic kidney disease, stage 3a: Secondary | ICD-10-CM | POA: Diagnosis present

## 2022-11-26 DIAGNOSIS — Z950 Presence of cardiac pacemaker: Secondary | ICD-10-CM | POA: Diagnosis present

## 2022-11-26 DIAGNOSIS — R0602 Shortness of breath: Secondary | ICD-10-CM

## 2022-11-26 DIAGNOSIS — N1832 Chronic kidney disease, stage 3b: Secondary | ICD-10-CM | POA: Diagnosis present

## 2022-11-26 DIAGNOSIS — Z1152 Encounter for screening for COVID-19: Secondary | ICD-10-CM

## 2022-11-26 DIAGNOSIS — E785 Hyperlipidemia, unspecified: Secondary | ICD-10-CM | POA: Diagnosis present

## 2022-11-26 DIAGNOSIS — Z803 Family history of malignant neoplasm of breast: Secondary | ICD-10-CM | POA: Diagnosis not present

## 2022-11-26 DIAGNOSIS — I4891 Unspecified atrial fibrillation: Secondary | ICD-10-CM | POA: Diagnosis present

## 2022-11-26 DIAGNOSIS — I5022 Chronic systolic (congestive) heart failure: Secondary | ICD-10-CM | POA: Diagnosis present

## 2022-11-26 DIAGNOSIS — Z7189 Other specified counseling: Secondary | ICD-10-CM | POA: Diagnosis not present

## 2022-11-26 DIAGNOSIS — Z7901 Long term (current) use of anticoagulants: Secondary | ICD-10-CM | POA: Diagnosis not present

## 2022-11-26 DIAGNOSIS — I10 Essential (primary) hypertension: Secondary | ICD-10-CM | POA: Diagnosis present

## 2022-11-26 DIAGNOSIS — R1310 Dysphagia, unspecified: Secondary | ICD-10-CM

## 2022-11-26 DIAGNOSIS — J690 Pneumonitis due to inhalation of food and vomit: Secondary | ICD-10-CM | POA: Diagnosis present

## 2022-11-26 DIAGNOSIS — N179 Acute kidney failure, unspecified: Secondary | ICD-10-CM | POA: Diagnosis present

## 2022-11-26 DIAGNOSIS — Z800 Family history of malignant neoplasm of digestive organs: Secondary | ICD-10-CM

## 2022-11-26 LAB — CBC
HCT: 36.2 % (ref 36.0–46.0)
Hemoglobin: 11.4 g/dL — ABNORMAL LOW (ref 12.0–15.0)
MCH: 29.8 pg (ref 26.0–34.0)
MCHC: 31.5 g/dL (ref 30.0–36.0)
MCV: 94.8 fL (ref 80.0–100.0)
Platelets: 336 10*3/uL (ref 150–400)
RBC: 3.82 MIL/uL — ABNORMAL LOW (ref 3.87–5.11)
RDW: 15 % (ref 11.5–15.5)
WBC: 8.2 10*3/uL (ref 4.0–10.5)
nRBC: 0 % (ref 0.0–0.2)

## 2022-11-26 LAB — COMPREHENSIVE METABOLIC PANEL
ALT: 21 U/L (ref 0–44)
AST: 21 U/L (ref 15–41)
Albumin: 3.6 g/dL (ref 3.5–5.0)
Alkaline Phosphatase: 126 U/L (ref 38–126)
Anion gap: 10 (ref 5–15)
BUN: 19 mg/dL (ref 8–23)
CO2: 22 mmol/L (ref 22–32)
Calcium: 8.6 mg/dL — ABNORMAL LOW (ref 8.9–10.3)
Chloride: 108 mmol/L (ref 98–111)
Creatinine, Ser: 1.2 mg/dL — ABNORMAL HIGH (ref 0.44–1.00)
GFR, Estimated: 43 mL/min — ABNORMAL LOW (ref >60)
Glucose, Bld: 107 mg/dL — ABNORMAL HIGH (ref 70–99)
Potassium: 3.7 mmol/L (ref 3.5–5.1)
Sodium: 140 mmol/L (ref 135–145)
Total Bilirubin: 0.8 mg/dL (ref 0.3–1.2)
Total Protein: 7.5 g/dL (ref 6.5–8.1)

## 2022-11-26 LAB — TROPONIN I (HIGH SENSITIVITY)
Troponin I (High Sensitivity): 16 ng/L (ref <18)
Troponin I (High Sensitivity): 17 ng/L (ref <18)

## 2022-11-26 LAB — RESP PANEL BY RT-PCR (RSV, FLU A&B, COVID)  RVPGX2
Influenza A by PCR: NEGATIVE
Influenza B by PCR: NEGATIVE
Resp Syncytial Virus by PCR: NEGATIVE
SARS Coronavirus 2 by RT PCR: NEGATIVE

## 2022-11-26 LAB — BRAIN NATRIURETIC PEPTIDE: B Natriuretic Peptide: 460.7 pg/mL — ABNORMAL HIGH (ref 0.0–100.0)

## 2022-11-26 LAB — PROCALCITONIN: Procalcitonin: 0.1 ng/mL

## 2022-11-26 MED ORDER — GUAIFENESIN ER 600 MG PO TB12: 600.0000 mg | ORAL_TABLET | Freq: Two times a day (BID) | ORAL | Status: DC | PRN

## 2022-11-26 MED ORDER — SODIUM CHLORIDE 0.9 % IV SOLN: 2.0000 g | INTRAVENOUS | Status: DC

## 2022-11-26 MED ORDER — SODIUM CHLORIDE 0.9 % IV SOLN
100.0000 mg | Freq: Once | INTRAVENOUS | Status: AC
  Administered 2022-11-26: 100 mg via INTRAVENOUS
  Filled 2022-11-26: qty 100

## 2022-11-26 MED ORDER — POLYETHYLENE GLYCOL 3350 17 G PO PACK: 17.0000 g | PACK | Freq: Every day | ORAL | Status: DC | PRN

## 2022-11-26 MED ORDER — SODIUM CHLORIDE 0.9 % IV SOLN
1.0000 g | Freq: Once | INTRAVENOUS | Status: DC
  Administered 2022-11-26: 1 g via INTRAVENOUS
  Filled 2022-11-26: qty 10

## 2022-11-26 MED ORDER — AMIODARONE HCL 200 MG PO TABS: 100.0000 mg | ORAL_TABLET | Freq: Every day | ORAL | Status: DC

## 2022-11-26 MED ORDER — RISAQUAD PO CAPS
2.0000 | ORAL_CAPSULE | Freq: Three times a day (TID) | ORAL | Status: DC
  Administered 2022-11-26 – 2022-11-29 (×8): 2 via ORAL
  Filled 2022-11-26 (×8): qty 2

## 2022-11-26 MED ORDER — SODIUM CHLORIDE 0.9 % IV SOLN: 500.0000 mg | INTRAVENOUS | Status: DC

## 2022-11-26 MED ORDER — ALBUTEROL SULFATE (2.5 MG/3ML) 0.083% IN NEBU: 2.5000 mg | INHALATION_SOLUTION | RESPIRATORY_TRACT | Status: DC | PRN

## 2022-11-26 MED ORDER — SODIUM CHLORIDE 0.9% FLUSH: 3.0000 mL | INTRAVENOUS | Status: DC | PRN

## 2022-11-26 MED ORDER — RIVAROXABAN 15 MG PO TABS
15.0000 mg | ORAL_TABLET | Freq: Every day | ORAL | Status: DC
  Administered 2022-11-27 – 2022-11-29 (×3): 15 mg via ORAL
  Filled 2022-11-26 (×3): qty 1

## 2022-11-26 MED ORDER — FUROSEMIDE 20 MG PO TABS
10.0000 mg | ORAL_TABLET | Freq: Two times a day (BID) | ORAL | Status: DC
  Administered 2022-11-27: 10 mg via ORAL
  Filled 2022-11-26 (×2): qty 0.5

## 2022-11-26 MED ORDER — LEVOTHYROXINE SODIUM 50 MCG PO TABS
50.0000 ug | ORAL_TABLET | Freq: Every day | ORAL | Status: DC
  Administered 2022-11-27 – 2022-11-29 (×3): 50 ug via ORAL
  Filled 2022-11-26 (×3): qty 1

## 2022-11-26 MED ORDER — ACETAMINOPHEN 650 MG RE SUPP: 650.0000 mg | Freq: Four times a day (QID) | RECTAL | Status: DC | PRN

## 2022-11-26 MED ORDER — BISACODYL 5 MG PO TBEC: 5.0000 mg | DELAYED_RELEASE_TABLET | Freq: Every day | ORAL | Status: DC | PRN

## 2022-11-26 MED ORDER — ACETAMINOPHEN 325 MG PO TABS: 650.0000 mg | ORAL_TABLET | Freq: Four times a day (QID) | ORAL | Status: DC | PRN

## 2022-11-26 MED ORDER — MIRTAZAPINE 15 MG PO TABS
7.5000 mg | ORAL_TABLET | Freq: Every day | ORAL | Status: DC
  Administered 2022-11-27 – 2022-11-28 (×2): 7.5 mg via ORAL
  Filled 2022-11-26 (×2): qty 1

## 2022-11-26 MED ORDER — FUROSEMIDE 10 MG/ML IJ SOLN
20.0000 mg | Freq: Once | INTRAMUSCULAR | Status: AC
  Administered 2022-11-26: 20 mg via INTRAVENOUS
  Filled 2022-11-26: qty 4

## 2022-11-26 MED ORDER — DILTIAZEM HCL ER COATED BEADS 180 MG PO CP24
180.0000 mg | ORAL_CAPSULE | Freq: Every day | ORAL | Status: DC
  Administered 2022-11-27: 180 mg via ORAL
  Filled 2022-11-26: qty 1

## 2022-11-26 MED ORDER — MORPHINE SULFATE (PF) 2 MG/ML IV SOLN: 2.0000 mg | INTRAVENOUS | Status: DC | PRN

## 2022-11-26 MED ORDER — HYDROCODONE-ACETAMINOPHEN 5-325 MG PO TABS: 1.0000 | ORAL_TABLET | ORAL | Status: DC | PRN

## 2022-11-26 MED ORDER — HYDRALAZINE HCL 20 MG/ML IJ SOLN: 5.0000 mg | INTRAMUSCULAR | Status: DC | PRN

## 2022-11-26 NOTE — Hospital Course (Signed)
89yo with h/o PAF on Xarelto, pacemaker placement, HTN, chronic systolic CHF, HLD, CVA, and hypothyroidism who presented on 10/12 with generalized weakness and SOB.  CXR with prominent B interstitial opacities and B lung base opacities concerning for infection.  Given ceftriaxone and azithromycin, doxycycline.  Negative procalcitonin, antibiotics stopped.  HR uncontrolled in 100-120s, likely for several weeks; suspect this + known systolic CHF has created chronic pulmonary edema as the source of her respiratory issues.  Cardiology consulted.

## 2022-11-26 NOTE — ED Provider Notes (Signed)
[facility] Provider Note    Event Date/Time   First MD Initiated Contact with Patient 11/26/22 1803     (approximate)   History   Chief Complaint Weakness   HPI  Frances Kent is a 89 y.o. female with past medical history of hypertension, hyperlipidemia, atrial fibrillation on Xarelto, CHF, interstitial lung disease, sick sinus syndrome status post pacemaker, and CKD who presents to the ED with weakness.  Patient reports that she was diagnosed with pneumonia earlier this month, started on Augmentin and doxycycline.  Medication was then stopped due to significant diarrhea, however patient had outpatient testing for C. difficile that was negative.  She has continued to feel weak since then with productive cough, denies any fevers or chest pain, does report some mild difficulty breathing.     Physical Exam   Triage Vital Signs: ED Triage Vitals  Encounter Vitals Group     BP 11/26/22 1405 (!) 101/56     Systolic BP Percentile --      Diastolic BP Percentile --      Pulse Rate 11/26/22 1405 (!) 108     Resp 11/26/22 1405 16     Temp 11/26/22 1405 98.2 F (36.8 C)     Temp Source 11/26/22 1406 Oral     SpO2 11/26/22 1405 93 %     Weight 11/26/22 1407 121 lb (54.9 kg)     Height 11/26/22 1407 5\' 3"  (1.6 m)     Head Circumference --      Peak Flow --      Pain Score 11/26/22 1407 0     Pain Loc --      Pain Education --      Exclude from Growth Chart --     Most recent vital signs: Vitals:   11/26/22 1405 11/26/22 1406  BP: (!) 101/56   Pulse: (!) 108   Resp: 16   Temp: 98.2 F (36.8 C) 98.1 F (36.7 C)  SpO2: 93%     Constitutional: Alert and oriented. Eyes: Conjunctivae are normal. Head: Atraumatic. Nose: No congestion/rhinnorhea. Mouth/Throat: Mucous membranes are moist.  Cardiovascular: Normal rate, irregularly irregular rhythm. Grossly normal heart sounds.  2+ radial pulses bilaterally. Respiratory: Normal respiratory effort.   No retractions. Lungs with crackles to bilateral bases. Gastrointestinal: Soft and nontender. No distention. Musculoskeletal: No lower extremity tenderness nor edema.  Neurologic:  Normal speech and language. No gross focal neurologic deficits are appreciated.    ED Results / Procedures / Treatments   Labs (all labs ordered are listed, but only abnormal results are displayed) Labs Reviewed  CBC - Abnormal; Notable for the following components:      Result Value   RBC 3.82 (*)    Hemoglobin 11.4 (*)    All other components within normal limits  COMPREHENSIVE METABOLIC PANEL - Abnormal; Notable for the following components:   Glucose, Bld 107 (*)    Creatinine, Ser 1.20 (*)    Calcium 8.6 (*)    GFR, Estimated 43 (*)    All other components within normal limits  RESP PANEL BY RT-PCR (RSV, FLU A&B, COVID)  RVPGX2  BRAIN NATRIURETIC PEPTIDE  PROCALCITONIN  TROPONIN I (HIGH SENSITIVITY)     EKG  ED ECG REPORT I, Chesley Noon, the attending physician, personally viewed and interpreted this ECG.   Date: 11/26/2022  EKG Time: 14:14  Rate: 109  Rhythm: atrial fibrillation  Axis: Normal  Intervals:right bundle branch block and left anterior fascicular  block  ST&T Change: None  RADIOLOGY Chest x-ray reviewed and interpreted by me with multifocal infiltrates, edema versus infection.  PROCEDURES:  Critical Care performed: No  Procedures   MEDICATIONS ORDERED IN ED: Medications  cefTRIAXone (ROCEPHIN) 1 g in sodium chloride 0.9 % 100 mL IVPB (has no administration in time range)  doxycycline (VIBRAMYCIN) 100 mg in sodium chloride 0.9 % 250 mL IVPB (has no administration in time range)     IMPRESSION / MDM / ASSESSMENT AND PLAN / ED COURSE  I reviewed the triage vital signs and the nursing notes.                              89 y.o. female with past medical history of hypertension, hyperlipidemia, CHF, CKD, atrial fibrillation on Xarelto, interstitial lung disease,  and sick sinus syndrome status post pacemaker who presents to the ED complaining of increasing generalized weakness with productive cough and mild difficulty breathing.  Patient's presentation is most consistent with acute presentation with potential threat to life or bodily function.  Differential diagnosis includes, but is not limited to, pneumonia, CHF exacerbation, ACS, PE, pneumothorax, anemia, electrolyte abnormality, AKI.  Patient nontoxic-appearing and in no acute distress, vital signs remarkable for tachycardia but otherwise reassuring.  Patient noted to have right lower lobe infiltrate on chest x-ray earlier this month, started on Augmentin but had difficulty tolerating this due to diarrhea.  Chest x-ray today appears to show evidence of worsening pneumonia, although patient does not meet sepsis criteria.  CHF also on the differential, will add on troponin and BNP, but start patient on IV antibiotics.  COVID testing is negative, labs show no significant anemia, leukocytosis, lecture abnormality, or AKI.  LFTs are also unremarkable.  Case discussed with hospitalist for admission.      FINAL CLINICAL IMPRESSION(S) / ED DIAGNOSES   Final diagnoses:  Generalized weakness  Pneumonia of both lower lobes due to infectious organism     Rx / DC Orders   ED Discharge Orders     None        Note:  This document was prepared using Dragon voice recognition software and may include unintentional dictation errors.   Chesley Noon, MD 11/26/22 [phone]

## 2022-11-26 NOTE — ED Notes (Signed)
IV sticks unsuccessful. Awaiting IV team for access.

## 2022-11-26 NOTE — ED Triage Notes (Addendum)
Pt sts that a month ago she went to her PCP for bronchitis/pneumonia and since than she has been really fatigued and no energy. Per Daughter, pt pcp did fill out palliative care paper work for pt two days ago.

## 2022-11-26 NOTE — H&P (Addendum)
History and Physical    Patient: Frances Kent MWU:[id number] DOB: [birth year] DOA: 11/26/2022 DOS: the patient was seen and examined on 11/26/2022 PCP: Frances Shams, MD  Patient coming from: Home   Chief Complaint:  Chief Complaint  Patient presents with   Weakness    HPI: VEOLA Kent is a 89 y.o. female with medical history significant for paroxysmal atrial fibrillation on chronic anticoagulation with Xarelto seen by [facility] clinic cardiology, sick sinus syndrome status post  leadless pacemaker , HTN, mitral stenosis, heart failure with reduced ejection fraction, HLD, chronic fatigue, Hx of CVA, hypothyroidism, Hx of PE, GAD  presenting with generalized weakness and SOB. PT has been treated by [facility] MD fro [facility] and developed diarrhea that was c.diff negative and comes to [place] with C/O SOB.    In ED patient is tachycardic in A-fib RVR at 108 respirations of 16 afebrile O2 sats of 93% on room air.  Metabolic panel shows AKI with a creatinine of 1.20 and EGFR of 43 otherwise normal LFTs and electrolytes.  BNP elevated at 460.7 normal troponin, procalcitonin 0.10, anemia with a hemoglobin of 11.4 otherwise normal CBC, respiratory panel negative for flu COVID and RSV. EKG shows A-fib RVR at 109 right bundle branch block and left anterior fascicular block with no ST-T wave changes, chest x-ray multifocal infiltrates versus edema or infection alert  Review of Systems: Review of Systems  Respiratory:  Positive for cough, shortness of breath and wheezing.   All other systems reviewed and are negative.   Past Medical History:  Diagnosis Date   Atrial fibrillation (HCC)    Benign breast cyst in female, left 10/07/2016   Candida esophagitis (HCC) 05/03/2021   Found on Mar 10 2021 EGD    CAP (community acquired pneumonia) 02/15/2021   Colon adenomas    GERD (gastroesophageal reflux disease)    HCAP (healthcare-associated pneumonia) 02/02/2021   Hypertension    Hypothyroidism     Macular degeneration    Mitral regurgitation    Pulmonary embolism (HCC) 02/2016   Severe sepsis (HCC) 02/02/2021   Stroke (HCC) 10/2020   SVT (supraventricular tachycardia) (HCC)    Past Surgical History:  Procedure Laterality Date   APPENDECTOMY  1960   BREAST CYST ASPIRATION Left 2017   CARDIOVERSION N/A 04/05/2016   Procedure: Cardioversion;  Surgeon: Frances Blinks, MD;  Location: [facility] ORS;  Service: Cardiovascular;  Laterality: N/A;   CHOLECYSTECTOMY  1985   ESOPHAGOGASTRODUODENOSCOPY N/A 03/10/2021   Procedure: ESOPHAGOGASTRODUODENOSCOPY (EGD);  Surgeon: Kent, Frances Nearing, MD;  Location: [facility] ENDOSCOPY;  Service: Gastroenterology;  Laterality: N/A;   ESOPHAGOGASTRODUODENOSCOPY (EGD) WITH PROPOFOL N/A 11/27/2019   Procedure: ESOPHAGOGASTRODUODENOSCOPY (EGD) WITH PROPOFOL;  Surgeon: Frances Bill, MD;  Location: [facility] ENDOSCOPY;  Service: Endoscopy;  Laterality: N/A;   ESOPHAGOGASTRODUODENOSCOPY (EGD) WITH PROPOFOL N/A 12/24/2019   Procedure: ESOPHAGOGASTRODUODENOSCOPY (EGD) WITH PROPOFOL;  Surgeon: Frances Bill, MD;  Location: [facility] ENDOSCOPY;  Service: Endoscopy;  Laterality: N/A;   IR CT HEAD LTD  11/01/2020   IR PERCUTANEOUS ART THROMBECTOMY/INFUSION INTRACRANIAL INC DIAG ANGIO  11/01/2020   IR US GUIDE VASC ACCESS RIGHT  11/02/2020   OVARIAN CYST REMOVAL     PACEMAKER LEADLESS INSERTION N/A 11/16/2021   Procedure: PACEMAKER LEADLESS INSERTION;  Surgeon: Marcina Millard, MD;  Location: [facility] INVASIVE CV LAB;  Service: Cardiovascular;  Laterality: N/A;   RADIOLOGY WITH ANESTHESIA N/A 11/01/2020   Procedure: RADIOLOGY WITH ANESTHESIA;  Surgeon: Frances Cotton, MD;  Location: [facility];  Service: Radiology;  Laterality: N/A;   TEE WITHOUT CARDIOVERSION N/A 02/01/2018   Procedure: TRANSESOPHAGEAL ECHOCARDIOGRAM (TEE);  Surgeon: Frances Blinks, MD;  Location: [facility] ORS;  Service: Cardiovascular;  Laterality: N/A;   Social History:   reports that she has never smoked.  She has never used smokeless tobacco. She reports that she does not drink alcohol and does not use drugs.  Allergies  Allergen Reactions   Statins     Severe myalgias    Family History  Problem Relation Age of Onset   Cancer Mother 71       breast cancer, lived to 31,    Breast cancer Mother 23   Cancer Son 49       pancreatic cancer   Heart disease Son        CAD, Tobacco Abuse    Heart disease Maternal Grandmother 64       died of massive MI   Stroke Sister     Prior to Admission medications   Medication Sig Start Date End Date Taking? Authorizing Provider  acetaminophen (TYLENOL) 500 MG tablet Take 500 mg by mouth every 6 (six) hours as needed.    [provider]  amiodarone (PACERONE) 200 MG tablet Take 0.5 tablets (100 mg total) by mouth daily. Home med. 06/01/22   Frances Shams, MD  benzonatate (TESSALON) 200 MG capsule Take 1 capsule (200 mg total) by mouth 3 (three) times daily as needed for cough. 11/22/22   Frances Shams, MD  clobetasol (TEMOVATE) 0.05 % external solution Apply 1 Application topically 2 (two) times daily. To scalp 03/25/22   Frances Shams, MD  diltiazem (CARDIZEM CD) 180 MG 24 hr capsule Take 180 mg by mouth daily. 09/27/22 09/27/23  [provider]  diltiazem (CARDIZEM LA) 240 MG 24 hr tablet Take 1 tablet (240 mg total) by mouth daily. 11/22/22   Frances Shams, MD  furosemide (LASIX) 20 MG tablet TAKE 1 TABLET BY MOUTH EVERY OTHER DAY 11/21/22   Frances Shams, MD  levalbuterol (XOPENEX) 0.63 MG/3ML nebulizer solution USE 1  VIAL IN NEBULIZER EVERY 4 HOURS AS NEEDED FOR WHEEZING AND FOR SHORTNESS OF BREATH 11/07/22   Salena Saner, MD  levothyroxine (SYNTHROID) 50 MCG tablet TAKE 1 TABLET BY MOUTH ONCE DAILY BEFORE BREAKFAST 11/14/22   Frances Shams, MD  mirtazapine (REMERON) 15 MG tablet TAKE 1 TABLET BY MOUTH AT BEDTIME 06/15/22   Frances Shams, MD  Multiple Vitamin (MULTIVITAMIN WITH MINERALS) TABS tablet Take 1 tablet by mouth  daily. 11/13/20   Love, Evlyn Kanner, PA-C  pantoprazole (PROTONIX) 20 MG tablet Take 1 tablet (20 mg total) by mouth 2 (two) times daily before a meal. 02/20/21 06/01/22  Darlin Priestly, MD  potassium chloride SA (KLOR-CON M) 20 MEQ tablet Take 20 mEq by mouth every other day.    [provider]  triamcinolone (KENALOG) 0.025 % ointment Apply 1 Application topically 2 (two) times daily. To eyelids and area under eyes 03/25/22   Frances Shams, MD  XARELTO 15 MG TABS tablet TAKE 1 TABLET EVERY DAY 05/04/22   Frances Shams, MD     Vitals:   11/26/22 1406 11/26/22 1407 11/26/22 1849 11/26/22 1912  BP:   121/83   Pulse:   (!) 108   Resp:   (!) 26   Temp: 98.1 F (36.7 C)   98.3 F (36.8 C)  TempSrc: Oral     SpO2:   92%   Weight:  54.9 kg    Height:  5\' 3"  (1.6 m)     Physical Exam Vitals and nursing note reviewed.  Constitutional:      General: She is not in acute distress. HENT:     Head: Normocephalic and atraumatic.     Right Ear: Hearing normal.     Left Ear: Hearing normal.     Nose: Nose normal. No nasal deformity.     Mouth/Throat:     Lips: Pink.     Tongue: No lesions.     Pharynx: Oropharynx is clear.  Eyes:     General: Lids are normal.     Extraocular Movements: Extraocular movements intact.  Cardiovascular:     Rate and Rhythm: Normal rate and regular rhythm.     Heart sounds: Normal heart sounds.  Pulmonary:     Breath sounds: Rales present.  Abdominal:     General: Bowel sounds are normal. There is no distension.     Palpations: Abdomen is soft. There is no mass.     Tenderness: There is no abdominal tenderness.  Skin:    General: Skin is warm.  Neurological:     General: No focal deficit present.     Mental Status: She is alert and oriented to person, place, and time.     Cranial Nerves: Cranial nerves 2-12 are intact.  Psychiatric:        Attention and Perception: Attention normal.        Mood and Affect: Mood normal.        Speech: Speech normal.         Behavior: Behavior normal. Behavior is cooperative.      Labs on Admission: I have personally reviewed following labs and imaging studies  CBC: Recent Labs  Lab 11/22/22 1534 11/26/22 1408  WBC 8.6 8.2  NEUTROABS 6.9  --   HGB 11.7* 11.4*  HCT 37.1 36.2  MCV 93.7 94.8  PLT 355.0 336   Basic Metabolic Panel: Recent Labs  Lab 11/22/22 1534 11/26/22 1408  NA 139 140  K 4.5 3.7  CL 106 108  CO2 24 22  GLUCOSE 100* 107*  BUN 13 19  CREATININE 1.20 1.20*  CALCIUM 9.3 8.6*  MG 1.9  --    GFR: Estimated Creatinine Clearance: 25.8 mL/min (A) (by C-G formula based on SCr of 1.2 mg/dL (H)). Liver Function Tests: Recent Labs  Lab 11/26/22 1408  AST 21  ALT 21  ALKPHOS 126  BILITOT 0.8  PROT 7.5  ALBUMIN 3.6   No results for input(s): "LIPASE", "AMYLASE" in the last 168 hours. No results for input(s): "AMMONIA" in the last 168 hours. Coagulation Profile: No results for input(s): "INR", "PROTIME" in the last 168 hours. Cardiac Enzymes: No results for input(s): "CKTOTAL", "CKMB", "CKMBINDEX", "TROPONINI" in the last 168 hours. BNP (last 3 results) No results for input(s): "PROBNP" in the last 8760 hours. HbA1C: No results for input(s): "HGBA1C" in the last 72 hours. CBG: No results for input(s): "GLUCAP" in the last 168 hours. Lipid Profile: No results for input(s): "CHOL", "HDL", "LDLCALC", "TRIG", "CHOLHDL", "LDLDIRECT" in the last 72 hours. Thyroid Function Tests: No results for input(s): "TSH", "T4TOTAL", "FREET4", "T3FREE", "THYROIDAB" in the last 72 hours. Anemia Panel: No results for input(s): "VITAMINB12", "FOLATE", "FERRITIN", "TIBC", "IRON", "RETICCTPCT" in the last 72 hours. Urinalysis    Component Value Date/Time   COLORURINE YELLOW 06/01/2022 1521   APPEARANCEUR Turbid (A) 06/01/2022 1521   APPEARANCEUR Clear 02/05/2013 1630   LABSPEC  1.025 06/01/2022 1521   LABSPEC 1.009 02/05/2013 1630   PHURINE 6.0 06/01/2022 1521   GLUCOSEU NEGATIVE  06/01/2022 1521   HGBUR NEGATIVE 06/01/2022 1521   BILIRUBINUR NEGATIVE 06/01/2022 1521   BILIRUBINUR 1 08/31/2017 1157   BILIRUBINUR Negative 02/05/2013 1630   KETONESUR NEGATIVE 06/01/2022 1521   PROTEINUR 100 (A) 09/08/2020 0105   UROBILINOGEN 0.2 06/01/2022 1521   NITRITE NEGATIVE 06/01/2022 1521   LEUKOCYTESUR NEGATIVE 06/01/2022 1521   LEUKOCYTESUR 1+ 02/05/2013 1630   Unresulted Labs (From admission, onward)     Start     Ordered   11/26/22 1917  Culture, blood (routine x 2) Call MD if unable to obtain prior to antibiotics being given  (COPD / Pneumonia / Cellulitis / Lower Extremity Wound)  BLOOD CULTURE X 2,   STAT     Comments: If blood cultures drawn in Emergency Department - Do not draw and cancel order    11/26/22 1920   11/26/22 1917  Strep pneumoniae urinary antigen  (COPD / Pneumonia / Cellulitis / Lower Extremity Wound)  Once,   R        11/26/22 1920           Medications  cefTRIAXone (ROCEPHIN) 1 g in sodium chloride 0.9 % 100 mL IVPB (has no administration in time range)  doxycycline (VIBRAMYCIN) 100 mg in sodium chloride 0.9 % 250 mL IVPB (has no administration in time range)  cefTRIAXone (ROCEPHIN) 2 g in sodium chloride 0.9 % 100 mL IVPB (has no administration in time range)  azithromycin (ZITHROMAX) 500 mg in sodium chloride 0.9 % 250 mL IVPB (has no administration in time range)  acetaminophen (TYLENOL) tablet 650 mg (has no administration in time range)    Or  acetaminophen (TYLENOL) suppository 650 mg (has no administration in time range)  HYDROcodone-acetaminophen (NORCO/VICODIN) 5-325 MG per tablet 1-2 tablet (has no administration in time range)  morphine (PF) 2 MG/ML injection 2 mg (has no administration in time range)  polyethylene glycol (MIRALAX / GLYCOLAX) packet 17 g (has no administration in time range)  bisacodyl (DULCOLAX) EC tablet 5 mg (has no administration in time range)  albuterol (PROVENTIL) (2.5 MG/3ML) 0.083% nebulizer solution 2.5  mg (has no administration in time range)  guaiFENesin (MUCINEX) 12 hr tablet 600 mg (has no administration in time range)  hydrALAZINE (APRESOLINE) injection 5 mg (has no administration in time range)  sodium chloride flush (NS) 0.9 % injection 3 mL (has no administration in time range)  amiodarone (PACERONE) tablet 100 mg (has no administration in time range)  diltiazem (CARDIZEM CD) 24 hr capsule 180 mg (has no administration in time range)  levothyroxine (SYNTHROID) tablet 50 mcg (has no administration in time range)  mirtazapine (REMERON) tablet 7.5 mg (has no administration in time range)  Rivaroxaban (XARELTO) tablet 15 mg (has no administration in time range)  furosemide (LASIX) tablet 20 mg (has no administration in time range)  furosemide (LASIX) injection 20 mg (has no administration in time range)    Radiological Exams on Admission: DG Chest 2 View  Result Date: 11/26/2022 CLINICAL DATA:  bronchitis vs pneumonia EXAM: CHEST - 2 VIEW COMPARISON:  CXR 11/16/22 FINDINGS: No pleural effusion. No pneumothorax. Unchanged cardiac and mediastinal contours. Loop recorder device projects over the left hemithorax. Increased hazy opacity at the bilateral lung bases could represent atelectasis or infection. Redemonstrated prominent bilateral interstitial opacities that could represent atypical infection or pulmonary venous congestion. IMPRESSION: Prominent bilateral interstitial opacities could represent pulmonary venous congestion  or atypical infection. There are more focal hazy opacities in the bilateral lung bases that could represent atelectasis or superimposed infection. Electronically Signed   By: Lorenza Cambridge M.D.   On: 11/26/2022 15:29     Data Reviewed: Relevant notes from primary care and specialist visits, past discharge summaries as available in EHR, including Care Everywhere. Prior diagnostic testing as pertinent to current admission diagnoses Updated medications and problem lists  for reconciliation ED course, including vitals, labs, imaging, treatment and response to treatment Triage notes, nursing and pharmacy notes and ED provider's notes Notable results as noted in HPI  SOB /Pneumonia,  versus CHF - The patient will be admitted to inpatient cardiac telemetry bed. - We will discontinue antibiotic  except for doxycycline therapy  as procal is 0.10 and clinically pt is more suggestive for CHF.  - Bronchodilator with therapy with DuoNebs will be given. - We will follow pneumonia antigens and sputum culture in addition to blood cultures. --Strict I/O and daily weights.   Atrial fibrillation. - We will continue Xarelto and amiodarone.  Hypothyroidism. - We will continue Synthroid and check TSH.   DVT prophylaxis:  Xarelto  Consults:  None  Advance Care Planning:    Code Status: Full Code   Family Communication:  None  Disposition Plan:  Home  Severity of Illness: The appropriate patient status for this patient is INPATIENT. Inpatient status is judged to be reasonable and necessary in order to provide the required intensity of service to ensure the patient's safety. The patient's presenting symptoms, physical exam findings, and initial radiographic and laboratory data in the context of their chronic comorbidities is felt to place them at high risk for further clinical deterioration. Furthermore, it is not anticipated that the patient will be medically stable for discharge from the hospital within 2 midnights of admission.   * I certify that at the point of admission it is my clinical judgment that the patient will require inpatient hospital care spanning beyond 2 midnights from the point of admission due to high intensity of service, high risk for further deterioration and high frequency of surveillance required.*  Author: Gertha Calkin, MD 11/26/2022 8:24 PM  For on call review [email or web address].

## 2022-11-27 ENCOUNTER — Encounter: Payer: Self-pay | Admitting: Internal Medicine

## 2022-11-27 DIAGNOSIS — I5023 Acute on chronic systolic (congestive) heart failure: Secondary | ICD-10-CM

## 2022-11-27 LAB — CBC WITH DIFFERENTIAL/PLATELET
Abs Immature Granulocytes: 0.06 10*3/uL (ref 0.00–0.07)
Basophils Absolute: 0.1 10*3/uL (ref 0.0–0.1)
Basophils Relative: 1 %
Eosinophils Absolute: 0 10*3/uL (ref 0.0–0.5)
Eosinophils Relative: 0 %
HCT: 35.5 % — ABNORMAL LOW (ref 36.0–46.0)
Hemoglobin: 11.3 g/dL — ABNORMAL LOW (ref 12.0–15.0)
Immature Granulocytes: 1 %
Lymphocytes Relative: 11 %
Lymphs Abs: 0.9 10*3/uL (ref 0.7–4.0)
MCH: 29.8 pg (ref 26.0–34.0)
MCHC: 31.8 g/dL (ref 30.0–36.0)
MCV: 93.7 fL (ref 80.0–100.0)
Monocytes Absolute: 0.8 10*3/uL (ref 0.1–1.0)
Monocytes Relative: 10 %
Neutro Abs: 6.7 10*3/uL (ref 1.7–7.7)
Neutrophils Relative %: 77 %
Platelets: 317 10*3/uL (ref 150–400)
RBC: 3.79 MIL/uL — ABNORMAL LOW (ref 3.87–5.11)
RDW: 15.3 % (ref 11.5–15.5)
WBC: 8.6 10*3/uL (ref 4.0–10.5)
nRBC: 0 % (ref 0.0–0.2)

## 2022-11-27 LAB — BASIC METABOLIC PANEL
Anion gap: 10 (ref 5–15)
BUN: 17 mg/dL (ref 8–23)
CO2: 23 mmol/L (ref 22–32)
Calcium: 8.7 mg/dL — ABNORMAL LOW (ref 8.9–10.3)
Chloride: 107 mmol/L (ref 98–111)
Creatinine, Ser: 1.13 mg/dL — ABNORMAL HIGH (ref 0.44–1.00)
GFR, Estimated: 46 mL/min — ABNORMAL LOW (ref >60)
Glucose, Bld: 127 mg/dL — ABNORMAL HIGH (ref 70–99)
Potassium: 3.6 mmol/L (ref 3.5–5.1)
Sodium: 140 mmol/L (ref 135–145)

## 2022-11-27 MED ORDER — AMIODARONE HCL 200 MG PO TABS
100.0000 mg | ORAL_TABLET | Freq: Every day | ORAL | Status: DC
  Administered 2022-11-27 – 2022-11-29 (×3): 100 mg via ORAL
  Filled 2022-11-27 (×3): qty 1

## 2022-11-27 MED ORDER — METOPROLOL TARTRATE 25 MG PO TABS
12.5000 mg | ORAL_TABLET | Freq: Two times a day (BID) | ORAL | Status: DC
  Administered 2022-11-27 – 2022-11-28 (×3): 12.5 mg via ORAL
  Filled 2022-11-27 (×3): qty 1

## 2022-11-27 MED ORDER — FUROSEMIDE 10 MG/ML IJ SOLN
40.0000 mg | Freq: Two times a day (BID) | INTRAMUSCULAR | Status: DC
  Administered 2022-11-27 – 2022-11-28 (×2): 40 mg via INTRAVENOUS
  Filled 2022-11-27 (×2): qty 4

## 2022-11-27 NOTE — Progress Notes (Addendum)
Progress Note   Patient: Frances Kent:[id number] DOB: 11-29-1930 DOA: 11/26/2022     1 DOS: the patient was seen and examined on 11/27/2022   Brief hospital course: 89yo with h/o PAF on Xarelto, pacemaker placement, HTN, chronic systolic CHF, HLD, CVA, and hypothyroidism who presented on 10/12 with generalized weakness and SOB.  CXR with prominent B interstitial opacities and B lung base opacities concerning for infection.  Given ceftriaxone and azithromycin, doxycycline.  Negative procalcitonin, antibiotics stopped.  HR uncontrolled in 100-120s, likely for several weeks; suspect this + known systolic CHF has created chronic pulmonary edema as the source of her respiratory issues.  Cardiology consulted.     Assessment and Plan:  Acute on chronic systolic CHF -Patient with known h/o chronic systolic CHF presenting with worsening SOB and marginal hypoxia (documented to 90%) -CXR consistent with mild pulmonary edema -Mildly elevated BNP when compared with known baseline -With elevated BNP and abnl CXR, acute decompensated CHF seems probable as diagnosis -Will admit to cardiac tele, as per the Emergency HF Mortality Risk Grade.  The patient has:  pulmonary edema requiring new O2 therapy -Will request echocardiogram; EF 40-45% in 2022 on last exam -ACE/ARB, beta blocker, and spironolactone are recommended as per guideline-directed medical therapy to reduce morbidity/mortality; she may need addition of these medication either as inpatient or on close outpatient f/u -CHF order set utilized -Cardiology consulted (Dr. Darrold Junker) -Was given Lasix 20 mg x 1 in ER and will repeat with 40 mg IV BID -Continue [place] O2 for now -Respiratory issues appear to be more cardiac in nature at this time, given several antibiotics without relief and negative WBC count and procalcitonin  Atrial Fibrillation  -Patient presenting with known afib; has likely been in mild RVR at least periodically since at least  10/3 visit -Remains with HR in 110s currently, which is likely increasing the demand on her heart and exacerbating her CHF (above) -Given the depressed EF seen previously will attempt to avoid Cardizem and will start low-dose metoprolol; amiodarone also resumed (QTc reviewed, 460) -Cardiology consulted -HS troponin negative x 2 -Will request Echocardiogram for further evaluation  -Continue home Xarelto  Dysphagia Patient has a h/o esophageal and oropharyngeal dysphagia in the past She is noticing difficulty swallowing pills and foods, is on soft diet without meats at home Will order speech therapy evaluation Aspiration is a consideration but would be more likely aspiration pneumonitis than infection if present  HTN -Takes low-dose Coreg monotherapy at home -Patient with suboptimal control while in the ER -Will add Lisinopril 10 mg PO daily as per recommendation of Dr. Wyline Mood. -Will also add prn hydralazine  HLD -Statin intolerant  Hypothyroidism -Continue Synthroid  Mood d/o -Continue mirtazapine  Stage 3b CKD -Appears to be stable at this time -Attempt to avoid nephrotoxic medications -Recheck BMP in AM   GOC -Currently wants to be full code -Has been trying to arrange for palliative care at home -Lives alone, may need more help either with [facility] or SNF rehab -Palliative and PT/OT and [facility] consults requested    Consultants: Cardiology Palliative care PT/OT RT [facility] team  Procedures: Echocardiogram  Antibiotics: Rocephin x 1 Doxycycline x 1  30 Day Unplanned Readmission Risk Score    Flowsheet Row ED to Hosp-Admission (Current) from 11/26/2022 in [facility] Emergency Department at [facility]  30 Day Unplanned Readmission Risk Score (%) 14.73 Filed at 11/27/2022 0801       This score is the patient's risk of an unplanned  readmission within 30 days of being discharged (0 -100%). The score is based on dignosis, age, lab data, medications, orders, and past  utilization.   Low:  0-14.9   Medium: 15-21.9   High: 22-29.9   Extreme: 30 and above           Subjective: In discussion with patient and her daughter, the patient had URI type symptoms and was seen by Dr. Birdie Sons on 9/18.  Cough mostly at night with chronic dyspnea, but not on home O2.  She was given antibiotics and returned to PCP on 10/2 with persistent fatigue.  She was also noted to have RVR into the 120s.  She was treated with another round of abx - Augmentin/Doxy, which led to diarrhea (C diff negative).  She was also recommended by cardiology to increase Dilt dose to 240 mg daily.  Palliative care was consulted as an outpateint.  This Am she is still not feeling well.  Periodic dry cough, SOB.  No frank orthopnea, + PND.    Physical Exam: Vitals:   11/27/22 0600 11/27/22 0821 11/27/22 0900 11/27/22 1149  BP: 122/88 (!) 132/91 116/72 (!) 119/95  Pulse: 68 (!) 111 (!) 112 (!) 119  Resp: (!) 32 (!) 28 (!) 32 20  Temp:  (!) 96.3 F (35.7 C)  98.2 F (36.8 C)  TempSrc:  Oral  Oral  SpO2: 91% 97% 99% 98%  Weight:      Height:         Intake/Output Summary (Last 24 hours) at 11/27/2022 1420 Last data filed at 11/26/2022 2352 Gross per 24 hour  Intake 350 ml  Output --  Net 350 ml   Filed Weights   11/26/22 1407  Weight: 54.9 kg    Exam:  General:  Appears frail and moderately ill, sitting up in bedside chair on [place] O2 Eyes:   EOMI, normal lids, iris ENT:  grossly normal hearing, lips & tongue, mmm Neck:  no LAD, masses or thyromegaly Cardiovascular:  Irregularly irregular with persistent tachycardia in 100-120 range. No LE edema.  Respiratory:   LLL crackles.  Mildly to moderately increased respiratory effort. Abdomen:  soft, NT, ND Skin:  no rash or induration seen on limited exam Musculoskeletal:  grossly normal tone BUE/BLE, good ROM, no bony abnormality Psychiatric:  blunted mood and affect, speech fluent and appropriate, AOx3 Neurologic:  CN 2-12 grossly  intact, moves all extremities in coordinated fashion  Data Reviewed: I have reviewed the patient's lab results since admission.  Pertinent labs for today include:  Glucose 127 BUN 17/Creatinine 1.13/GFR 46 - stable BNP 460.7 HS troponin 16, 17 WBC 8.6 Hgb 11.3 COVID/flu/RSV negative Blood cultures pending    Family Communication: Daughter was present throughout evaluation  Disposition: Status is: Inpatient Remains inpatient appropriate because: ongoing evaluation and treatment  Planned Discharge Destination:  TBD    Time spent: 50 minutes  Author: Jonah Blue, MD 11/27/2022 2:20 PM  For on call review [email or web address].

## 2022-11-27 NOTE — Consult Note (Signed)
[facility] Cardiology  CARDIOLOGY CONSULT NOTE  Patient ID: Frances Kent MRN: [id number] DOB/AGE: Jun 06, 1930 89 y.o.  Admit date: 11/26/2022 Referring Physician Allena Katz Primary Physician [facility] Primary Cardiologist Tim Wilhide Reason for Consultation atrial fibrillation  HPI: 89 year old female referred for evaluation of atrial fibrillation and possible congestive heart failure.  The patient has a history of chronic atrial fibrillation, on Xarelto for stroke prevention.  She is status post Micra AV leadless pacemaker for sick sinus syndrome.  She has had a several week history of shortness of breath, productive cough treated as outpatient for bronchitis.  She presents to [facility] emergency room with chief complaint of generalized weakness and shortness of breath.  The emergency room patient noted to be in atrial fibrillation at a rate of 108 bpm.  Chest x-ray revealed lateral interstitial opacities consistent with pneumonia versus congestive heart failure.  Admission labs noted for mildly elevated BNP 460.7.  2D echocardiogram 11/06/2019 revealed LVEF of 40%.  72-hour Holter monitor 07/20/2022 revealed atrial fibrillation with mean heart rate of 74 bpm with heart rate range 49 to 127 bpm.  The patient is most recently been on Cardizem CD 120 mg for rate control.  Review of systems complete and found to be negative unless listed above     Past Medical History:  Diagnosis Date   Atrial fibrillation (HCC)    Benign breast cyst in female, left 10/07/2016   Candida esophagitis (HCC) 05/03/2021   Found on Mar 10 2021 EGD    CAP (community acquired pneumonia) 02/15/2021   Colon adenomas    GERD (gastroesophageal reflux disease)    HCAP (healthcare-associated pneumonia) 02/02/2021   Hypertension    Hypothyroidism    Macular degeneration    Mitral regurgitation    Pulmonary embolism (HCC) 02/2016   Severe sepsis (HCC) 02/02/2021   Stroke (HCC) 10/2020   SVT (supraventricular tachycardia) (HCC)     Past  Surgical History:  Procedure Laterality Date   APPENDECTOMY  1960   BREAST CYST ASPIRATION Left 2017   CARDIOVERSION N/A 04/05/2016   Procedure: Cardioversion;  Surgeon: Lamar Blinks, MD;  Location: [facility] ORS;  Service: Cardiovascular;  Laterality: N/A;   CHOLECYSTECTOMY  1985   ESOPHAGOGASTRODUODENOSCOPY N/A 03/10/2021   Procedure: ESOPHAGOGASTRODUODENOSCOPY (EGD);  Surgeon: Toledo, Boykin Nearing, MD;  Location: [facility] ENDOSCOPY;  Service: Gastroenterology;  Laterality: N/A;   ESOPHAGOGASTRODUODENOSCOPY (EGD) WITH PROPOFOL N/A 11/27/2019   Procedure: ESOPHAGOGASTRODUODENOSCOPY (EGD) WITH PROPOFOL;  Surgeon: Regis Bill, MD;  Location: [facility] ENDOSCOPY;  Service: Endoscopy;  Laterality: N/A;   ESOPHAGOGASTRODUODENOSCOPY (EGD) WITH PROPOFOL N/A 12/24/2019   Procedure: ESOPHAGOGASTRODUODENOSCOPY (EGD) WITH PROPOFOL;  Surgeon: Regis Bill, MD;  Location: [facility] ENDOSCOPY;  Service: Endoscopy;  Laterality: N/A;   IR CT HEAD LTD  11/01/2020   IR PERCUTANEOUS ART THROMBECTOMY/INFUSION INTRACRANIAL INC DIAG ANGIO  11/01/2020   IR US GUIDE VASC ACCESS RIGHT  11/02/2020   OVARIAN CYST REMOVAL     PACEMAKER LEADLESS INSERTION N/A 11/16/2021   Procedure: PACEMAKER LEADLESS INSERTION;  Surgeon: Marcina Millard, MD;  Location: [facility] INVASIVE CV LAB;  Service: Cardiovascular;  Laterality: N/A;   RADIOLOGY WITH ANESTHESIA N/A 11/01/2020   Procedure: RADIOLOGY WITH ANESTHESIA;  Surgeon: Julieanne Cotton, MD;  Location: [facility] OR;  Service: Radiology;  Laterality: N/A;   TEE WITHOUT CARDIOVERSION N/A 02/01/2018   Procedure: TRANSESOPHAGEAL ECHOCARDIOGRAM (TEE);  Surgeon: Lamar Blinks, MD;  Location: [facility] ORS;  Service: Cardiovascular;  Laterality: N/A;    (Not in a hospital admission)  Social History   Socioeconomic  History   Marital status: Widowed    Spouse name: Not on file   Number of children: Not on file   Years of education: Not on file   Highest education level: Not on file   Occupational History   Not on file  Tobacco Use   Smoking status: Never   Smokeless tobacco: Never   Tobacco comments:    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 , worked at [employer], Animator status: Never Used  Substance and Sexual Activity   Alcohol use: No   Drug use: No   Sexual activity: Not Currently  Other Topics Concern   Not on file  Social History Narrative   Has caretaking responsibility for 3 young grandchildren who live with her   Social Determinants of Health   Financial Resource Strain: Low Risk  (06/11/2021)   Overall Financial Resource Strain (CARDIA)    Difficulty of Paying Living Expenses: Not hard at all  Food Insecurity: No Food Insecurity (11/16/2021)   Hunger Vital Sign    Worried About Running Out of Food in the Last Year: Never true    Ran Out of Food in the Last Year: Never true  Transportation Needs: No Transportation Needs (11/16/2021)   PRAPARE - Administrator, Civil Service (Medical): No    Lack of Transportation (Non-Medical): No  Physical Activity: Not on file  Stress: No Stress Concern Present (06/11/2021)   [employer] of Occupational Health - Occupational Stress Questionnaire    Feeling of Stress : Not at all  Social Connections: Unknown (06/11/2021)   Social Connection and Isolation Panel [NHANES]    Frequency of Communication with Friends and Family: More than three times a week    Frequency of Social Gatherings with Friends and Family: More than three times a week    Attends Religious Services: Not on file    Active Member of Clubs or Organizations: Not on file    Attends Banker Meetings: Not on file    Marital Status: Widowed  Intimate Partner Violence: Not At Risk (11/16/2021)   Humiliation, Afraid, Rape, and Kick questionnaire    Fear of Current or Ex-Partner: No    Emotionally Abused: No    Physically Abused: No    Sexually Abused: No    Family History  Problem Relation Age of Onset   Cancer  Mother 45       breast cancer, lived to 16,    Breast cancer Mother 46   Cancer Son 71       pancreatic cancer   Heart disease Son        CAD, Tobacco Abuse    Heart disease Maternal Grandmother 39       died of massive MI   Stroke Sister       Review of systems complete and found to be negative unless listed above      PHYSICAL EXAM  General: Well developed, well nourished, in no acute distress HEENT:  Normocephalic and atramatic Neck:  No JVD.  Lungs: Clear bilaterally to auscultation and percussion. Heart: HRRR . Normal S1 and S2 without gallops or murmurs.  Abdomen: Bowel sounds are positive, abdomen soft and non-tender  Msk:  Back normal, normal gait. Normal strength and tone for age. Extremities: No clubbing, cyanosis or edema.   Neuro: Alert and oriented X 3. Psych:  Good affect, responds appropriately  Labs:   Lab Results  Component Value Date   WBC 8.6 11/27/2022  HGB 11.3 (L) 11/27/2022   HCT 35.5 (L) 11/27/2022   MCV 93.7 11/27/2022   PLT 317 11/27/2022    Recent Labs  Lab 11/26/22 1408 11/27/22 1159  NA 140 140  K 3.7 3.6  CL 108 107  CO2 22 23  BUN 19 17  CREATININE 1.20* 1.13*  CALCIUM 8.6* 8.7*  PROT 7.5  --   BILITOT 0.8  --   ALKPHOS 126  --   ALT 21  --   AST 21  --   GLUCOSE 107* 127*   Lab Results  Component Value Date   CKTOTAL 83 05/09/2018   TROPONINI <0.03 02/25/2018    Lab Results  Component Value Date   CHOL 257 (H) 11/23/2021   CHOL 144 11/02/2020   CHOL 284 (H) 12/11/2019   Lab Results  Component Value Date   HDL 96.40 11/23/2021   HDL 48 11/02/2020   HDL 100.10 12/11/2019   Lab Results  Component Value Date   LDLCALC 140 (H) 11/23/2021   LDLCALC 81 11/02/2020   LDLCALC 166 (H) 12/11/2019   Lab Results  Component Value Date   TRIG 104.0 11/23/2021   TRIG 76 11/02/2020   TRIG 72 11/02/2020   Lab Results  Component Value Date   CHOLHDL 3 11/23/2021   CHOLHDL 3.0 11/02/2020   CHOLHDL 3 12/11/2019    Lab Results  Component Value Date   LDLDIRECT 185.3 03/27/2013   LDLDIRECT 156.1 03/23/2012   LDLDIRECT 181.4 01/17/2011      Radiology: DG Chest 2 View  Result Date: 11/26/2022 CLINICAL DATA:  bronchitis vs pneumonia EXAM: CHEST - 2 VIEW COMPARISON:  CXR 11/16/22 FINDINGS: No pleural effusion. No pneumothorax. Unchanged cardiac and mediastinal contours. Loop recorder device projects over the left hemithorax. Increased hazy opacity at the bilateral lung bases could represent atelectasis or infection. Redemonstrated prominent bilateral interstitial opacities that could represent atypical infection or pulmonary venous congestion. IMPRESSION: Prominent bilateral interstitial opacities could represent pulmonary venous congestion or atypical infection. There are more focal hazy opacities in the bilateral lung bases that could represent atelectasis or superimposed infection. Electronically Signed   By: Lorenza Cambridge M.D.   On: 11/26/2022 15:29   DG Chest 2 View  Result Date: 11/16/2022 CLINICAL DATA:  Cough and fatigue EXAM: CHEST - 2 VIEW COMPARISON:  Chest radiograph 08/04/2022 FINDINGS: A loop recorder is again noted. The cardiomediastinal silhouette is stable, with unchanged cardiomegaly. Coarsened interstitial markings throughout both lungs are again seen. There is new opacity projecting over the right midlung. Previously seen left upper lobe opacity has essentially resolved. There is no pleural effusion or pneumothorax Compression deformity of the T11 vertebral body is unchanged. Cholecystectomy clips are noted. IMPRESSION: New opacities projecting over the right midlung suspicious for pneumonia. Note that these opacities have improved on the subsequently obtained radiograph dated 11/16/2022. Electronically Signed   By: Lesia Hausen M.D.   On: 11/16/2022 11:56   DG Chest 2 View  Result Date: 11/16/2022 CLINICAL DATA:  Persistent cough after treatment for pneumonia EXAM: CHEST - 2 VIEW  COMPARISON:  Chest radiograph 11/02/2022 FINDINGS: A loop recorder is again noted.  The heart is enlarged, unchanged. There are coarsened interstitial markings throughout both lungs, stable. Previously seen opacities projecting over the right midlung have improved. There is no new or worsening focal airspace consolidation. There is no pleural effusion or pneumothorax There is unchanged compression deformity of the T11 vertebral body. There is no new acute osseous abnormality. Cholecystectomy clips are noted.  IMPRESSION: 1. Similar coarsened interstitial markings throughout both lungs may reflect bronchitis or pulmonary interstitial edema. 2. Improved opacities projecting over the right midlung since the most recent prior study. No new or worsening focal airspace consolidation or pleural effusion. Electronically Signed   By: Lesia Hausen M.D.   On: 11/16/2022 11:55    EKG: Atrial fibrillation, right bundle branch block, 109 bpm  ASSESSMENT AND PLAN:   1.  Chronic atrial fibrillation, with RVR, with most recent 72-hour Holter monitor 07/20/2022 revealed mean heart rate 74 bpm, heart rate range 49 to 127 bpm, minimally symptomatic, with heart rate mildly elevated, could be compensatory to underlying bronchitis/pneumonia, previously on Cardizem CD milligrams daily, now on metoprolol to tartrate 25 mg twice daily and low-dose amiodarone 100 mg daily 2.  Shortness of breath, with recent bronchitis, possible mild HFmpEF, with mildly elevated BNP (460.7), with chest x-ray revealing bilateral interstitial opacities, patient appears euvolemic, without peripheral edema 3.  Sick sinus syndrome, status post Micra AV 210/04/2021 4.  Moderate mitral stenosis/mitral regurgitation by 2D echocardiogram 11/07/2019  Recommendations  1.  Agree with overall current therapy 2.  Continue diuresis for now 3.  Carefully monitor renal status 4.  Continue Xarelto for stroke prevention 5.  Continue metoprolol to tartrate for rate  control, uptitrate as needed 6.  Continue low-dose amiodarone 7.  Review 2D echocardiogram 8.  Further recommendations pending 2D echocardiogram results    Signed: Marcina Millard MD,PhD, [facility] 11/27/2022, 2:59 PM

## 2022-11-27 NOTE — ED Notes (Signed)
Advised nurse that patient has ready bed 

## 2022-11-27 NOTE — Plan of Care (Signed)

## 2022-11-28 ENCOUNTER — Inpatient Hospital Stay (HOSPITAL_COMMUNITY)
Admit: 2022-11-28 | Discharge: 2022-11-28 | Disposition: A | Discharge status: Home / Self Care | Payer: Medicare HMO | Attending: Internal Medicine | Admitting: Internal Medicine

## 2022-11-28 ENCOUNTER — Encounter: Payer: Self-pay | Admitting: Internal Medicine

## 2022-11-28 DIAGNOSIS — I509 Heart failure, unspecified: Secondary | ICD-10-CM | POA: Diagnosis not present

## 2022-11-28 DIAGNOSIS — Z7189 Other specified counseling: Secondary | ICD-10-CM

## 2022-11-28 DIAGNOSIS — I5023 Acute on chronic systolic (congestive) heart failure: Secondary | ICD-10-CM | POA: Diagnosis not present

## 2022-11-28 LAB — CBC WITH DIFFERENTIAL/PLATELET
Abs Immature Granulocytes: 0.04 10*3/uL (ref 0.00–0.07)
Basophils Absolute: 0.1 10*3/uL (ref 0.0–0.1)
Basophils Relative: 1 %
Eosinophils Absolute: 0.2 10*3/uL (ref 0.0–0.5)
Eosinophils Relative: 3 %
HCT: 36.1 % (ref 36.0–46.0)
Hemoglobin: 11.9 g/dL — ABNORMAL LOW (ref 12.0–15.0)
Immature Granulocytes: 1 %
Lymphocytes Relative: 11 %
Lymphs Abs: 0.9 10*3/uL (ref 0.7–4.0)
MCH: 29.9 pg (ref 26.0–34.0)
MCHC: 33 g/dL (ref 30.0–36.0)
MCV: 90.7 fL (ref 80.0–100.0)
Monocytes Absolute: 0.8 10*3/uL (ref 0.1–1.0)
Monocytes Relative: 10 %
Neutro Abs: 5.8 10*3/uL (ref 1.7–7.7)
Neutrophils Relative %: 74 %
Platelets: 319 10*3/uL (ref 150–400)
RBC: 3.98 MIL/uL (ref 3.87–5.11)
RDW: 15.2 % (ref 11.5–15.5)
WBC: 7.8 10*3/uL (ref 4.0–10.5)
nRBC: 0 % (ref 0.0–0.2)

## 2022-11-28 LAB — BASIC METABOLIC PANEL
Anion gap: 10 (ref 5–15)
BUN: 14 mg/dL (ref 8–23)
CO2: 25 mmol/L (ref 22–32)
Calcium: 8.4 mg/dL — ABNORMAL LOW (ref 8.9–10.3)
Chloride: 101 mmol/L (ref 98–111)
Creatinine, Ser: 1.11 mg/dL — ABNORMAL HIGH (ref 0.44–1.00)
GFR, Estimated: 47 mL/min — ABNORMAL LOW (ref >60)
Glucose, Bld: 98 mg/dL (ref 70–99)
Potassium: 3.2 mmol/L — ABNORMAL LOW (ref 3.5–5.1)
Sodium: 136 mmol/L (ref 135–145)

## 2022-11-28 LAB — ECHOCARDIOGRAM COMPLETE
AR max vel: 2.49 cm2
AV Area VTI: 2.31 cm2
AV Area mean vel: 2.15 cm2
AV Mean grad: 2 mm[Hg]
AV Peak grad: 4.1 mm[Hg]
Ao pk vel: 1.02 m/s
Area-P 1/2: 2.12 cm2
Height: 63 in
MV VTI: 0.77 cm2
S' Lateral: 2.5 cm
Weight: 1935.99 [oz_av]

## 2022-11-28 MED ORDER — METOPROLOL TARTRATE 25 MG PO TABS: 12.5000 mg | ORAL_TABLET | Freq: Two times a day (BID) | ORAL | Status: DC

## 2022-11-28 MED ORDER — POTASSIUM CHLORIDE CRYS ER 20 MEQ PO TBCR
30.0000 meq | EXTENDED_RELEASE_TABLET | Freq: Once | ORAL | Status: AC
  Administered 2022-11-28: 30 meq via ORAL
  Filled 2022-11-28: qty 1

## 2022-11-28 MED ORDER — METOPROLOL SUCCINATE ER 25 MG PO TB24: 12.5000 mg | ORAL_TABLET | Freq: Two times a day (BID) | ORAL | Status: DC

## 2022-11-28 MED ORDER — METOPROLOL SUCCINATE ER 25 MG PO TB24
12.5000 mg | ORAL_TABLET | Freq: Every day | ORAL | Status: DC
  Administered 2022-11-29: 12.5 mg via ORAL
  Filled 2022-11-28: qty 1

## 2022-11-28 MED ORDER — FUROSEMIDE 10 MG/ML IJ SOLN
40.0000 mg | Freq: Two times a day (BID) | INTRAMUSCULAR | Status: DC
  Administered 2022-11-28 – 2022-11-29 (×2): 40 mg via INTRAVENOUS
  Filled 2022-11-28 (×2): qty 4

## 2022-11-28 NOTE — Consult Note (Addendum)
Consultation Note Date: 11/28/2022   Patient Name: Frances Kent  DOB: March 12, 1930  MRN: [id number]  Age / Sex: 89 y.o., female  PCP: Sherlene Shams, MD Referring Physician: Jonah Blue, MD  Reason for Consultation: Establishing goals of care  HPI/Patient Profile: 89yo with h/o PAF on Xarelto, pacemaker placement, HTN, chronic systolic CHF, HLD, CVA, and hypothyroidism who presented on 10/12 with generalized weakness and SOB.  CXR with prominent B interstitial opacities and B lung base opacities concerning for infection.  Given ceftriaxone and azithromycin, doxycycline.  Negative procalcitonin, antibiotics stopped.  HR uncontrolled in 100-120s, likely for several weeks; suspect this + known systolic CHF has created chronic pulmonary edema as the source of her respiratory issues.   Clinical Assessment and Goals of Care: Notes and labs reviewed.  In to see patient.  Patient is currently resting in bed with her oldest daughter [name] at bedside.  Patient states she has 5/6 children living.  She states her oldest daughter and her youngest daughter are her healthcare powers of attorney.  Daughter discusses updates.  Daughter discusses quality of life prior to this admission.  Patient states she has had a difficult past few months having pneumonia.  She discusses having esophageal dilatation x 2 in the past, and difficulty with swallowing.  She discusses that she is felt increasingly weak.  Daughter inquires about hospice care and advises that her husband and other family members have had hospice in the past.  We discussed concepts such as CODE STATUS, ventilator support, and return to the hospital.  Patient's pastor arrived to bedside, and patient family would like to speak with him.  Discussed that I would follow-up tomorrow.  Patient will consider her wishes care moving forward.    SUMMARY OF RECOMMENDATIONS    PMT will follow-up        Primary Diagnoses: Present on Admission:  Atrial fibrillation status post cardioversion [facility])  Hypothyroidism due to acquired atrophy of thyroid  Acute on chronic systolic CHF (congestive heart failure) (HCC)  Essential hypertension  Chronic kidney disease, stage 3b (HCC)  Pacemaker   I have reviewed the medical record, interviewed the patient and family, and examined the patient. The following aspects are pertinent.  Past Medical History:  Diagnosis Date   Atrial fibrillation (HCC)    Benign breast cyst in female, left 10/07/2016   Candida esophagitis (HCC) 05/03/2021   Found on Mar 10 2021 EGD    CAP (community acquired pneumonia) 02/15/2021   Colon adenomas    GERD (gastroesophageal reflux disease)    HCAP (healthcare-associated pneumonia) 02/02/2021   Hypertension    Hypothyroidism    Macular degeneration    Mitral regurgitation    Pulmonary embolism (HCC) 02/2016   Severe sepsis (HCC) 02/02/2021   Stroke (HCC) 10/2020   SVT (supraventricular tachycardia) (HCC)    Social History   Socioeconomic History   Marital status: Widowed    Spouse name: Not on file   Number of children: Not on file   Years of education: Not  on file   Highest education level: Not on file  Occupational History   Not on file  Tobacco Use   Smoking status: Never   Smokeless tobacco: Never   Tobacco comments:    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 , worked at [employer], Animator status: Never Used  Substance and Sexual Activity   Alcohol use: No   Drug use: No   Sexual activity: Not Currently  Other Topics Concern   Not on file  Social History Narrative   Has caretaking responsibility for 3 young grandchildren who live with her   Social Determinants of Health   Financial Resource Strain: Low Risk  (06/11/2021)   Overall Financial Resource Strain (CARDIA)    Difficulty of Paying Living Expenses: Not hard at all  Food Insecurity: No Food Insecurity  (11/27/2022)   Hunger Vital Sign    Worried About Running Out of Food in the Last Year: Never true    Ran Out of Food in the Last Year: Never true  Transportation Needs: No Transportation Needs (11/27/2022)   PRAPARE - Administrator, Civil Service (Medical): No    Lack of Transportation (Non-Medical): No  Physical Activity: Not on file  Stress: No Stress Concern Present (06/11/2021)   [employer] of Occupational Health - Occupational Stress Questionnaire    Feeling of Stress : Not at all  Social Connections: Unknown (06/11/2021)   Social Connection and Isolation Panel [NHANES]    Frequency of Communication with Friends and Family: More than three times a week    Frequency of Social Gatherings with Friends and Family: More than three times a week    Attends Religious Services: Not on file    Active Member of Clubs or Organizations: Not on file    Attends Banker Meetings: Not on file    Marital Status: Widowed   Family History  Problem Relation Age of Onset   Cancer Mother 43       breast cancer, lived to 41,    Breast cancer Mother 68   Cancer Son 63       pancreatic cancer   Heart disease Son        CAD, Tobacco Abuse    Heart disease Maternal Grandmother 12       died of massive MI   Stroke Sister    Scheduled Meds:  acidophilus  2 capsule Oral TID   amiodarone  100 mg Oral Daily   furosemide  40 mg Intravenous BID   levothyroxine  50 mcg Oral QAC breakfast   [START ON 11/29/2022] metoprolol succinate  12.5 mg Oral Daily   mirtazapine  7.5 mg Oral QHS   Rivaroxaban  15 mg Oral Daily   Continuous Infusions: PRN Meds:.acetaminophen **OR** acetaminophen, albuterol, bisacodyl, guaiFENesin, hydrALAZINE, HYDROcodone-acetaminophen, morphine injection, polyethylene glycol, sodium chloride flush Medications Prior to Admission:  Prior to Admission medications   Medication Sig Start Date End Date Taking? Authorizing Provider  acetaminophen (TYLENOL)  500 MG tablet Take 500 mg by mouth every 6 (six) hours as needed.   Yes [provider]  amiodarone (PACERONE) 200 MG tablet Take 0.5 tablets (100 mg total) by mouth daily. Home med. 06/01/22  Yes Sherlene Shams, MD  benzonatate (TESSALON) 200 MG capsule Take 1 capsule (200 mg total) by mouth 3 (three) times daily as needed for cough. 11/22/22  Yes Sherlene Shams, MD  diltiazem (CARDIZEM CD) 180 MG 24 hr capsule Take 180 mg  by mouth daily. 09/27/22 09/27/23 Yes [provider]  furosemide (LASIX) 20 MG tablet TAKE 1 TABLET BY MOUTH EVERY OTHER DAY 11/21/22  Yes Sherlene Shams, MD  levalbuterol (XOPENEX) 0.63 MG/3ML nebulizer solution USE 1  VIAL IN NEBULIZER EVERY 4 HOURS AS NEEDED FOR WHEEZING AND FOR SHORTNESS OF BREATH 11/07/22  Yes Salena Saner, MD  levothyroxine (SYNTHROID) 50 MCG tablet TAKE 1 TABLET BY MOUTH ONCE DAILY BEFORE BREAKFAST 11/14/22  Yes Sherlene Shams, MD  mirtazapine (REMERON) 15 MG tablet TAKE 1 TABLET BY MOUTH AT BEDTIME 06/15/22  Yes Sherlene Shams, MD  pantoprazole (PROTONIX) 20 MG tablet Take 1 tablet (20 mg total) by mouth 2 (two) times daily before a meal. 02/20/21 11/26/22 Yes Darlin Priestly, MD  potassium chloride SA (KLOR-CON M) 20 MEQ tablet Take 20 mEq by mouth every other day.   Yes [provider]  Probiotic CHEW Chew 2 tablets by mouth daily.   Yes [provider]  XARELTO 15 MG TABS tablet TAKE 1 TABLET EVERY DAY 05/04/22  Yes Sherlene Shams, MD  clobetasol (TEMOVATE) 0.05 % external solution Apply 1 Application topically 2 (two) times daily. To scalp Patient not taking: Reported on 11/26/2022 03/25/22   Sherlene Shams, MD  diltiazem (CARDIZEM LA) 240 MG 24 hr tablet Take 1 tablet (240 mg total) by mouth daily. Patient not taking: Reported on 11/26/2022 11/22/22   Sherlene Shams, MD  Multiple Vitamin (MULTIVITAMIN WITH MINERALS) TABS tablet Take 1 tablet by mouth daily. Patient not taking: Reported on 11/26/2022 11/13/20   Love,  Evlyn Kanner, PA-C  triamcinolone (KENALOG) 0.025 % ointment Apply 1 Application topically 2 (two) times daily. To eyelids and area under eyes Patient not taking: Reported on 11/26/2022 03/25/22   Sherlene Shams, MD   Allergies  Allergen Reactions   Statins     Severe myalgias   Review of Systems  Constitutional:  Positive for activity change and fatigue.    Physical Exam Pulmonary:     Effort: Pulmonary effort is normal.  Neurological:     Mental Status: She is alert.     Vital Signs: BP (!) 101/59 (BP Location: Right Arm)   Pulse (!) 49   Temp 98.3 F (36.8 C) (Oral)   Resp 17   Ht 5\' 3"  (1.6 m)   Wt 55.3 kg   SpO2 96%   BMI 21.60 kg/m  Pain Scale: 0-10   Pain Score: 0-No pain   SpO2: SpO2: 96 % O2 Device:SpO2: 96 % O2 Flow Rate: .O2 Flow Rate (L/min): 1 L/min  IO: Intake/output summary:  Intake/Output Summary (Last 24 hours) at 11/28/2022 1541 Last data filed at 11/28/2022 1421 Gross per 24 hour  Intake 0 ml  Output 1350 ml  Net -1350 ml    LBM: Last BM Date : 11/27/22 Baseline Weight: Weight: 54.9 kg Most recent weight: Weight: 55.3 kg        Signed by: Morton Stall, NP   Please contact Palliative Medicine Team phone at [phone] for questions and concerns.  For individual provider: See Loretha Stapler

## 2022-11-28 NOTE — Evaluation (Addendum)
Physical Therapy Evaluation Patient Details Name: Frances Kent MRN: [id number] DOB: 1930/04/09 Today's Date: 11/28/2022  History of Present Illness  Patient is a 89 year old female with chronic atrial fibrillation, shortness of breath, acute on chronic systolic CHF.  Clinical Impression  Patient is agreeable to PT evaluation. She lives alone but family lives next door. Patient is modified independent with mobility using rolling walker and performs ADLs without assistance at baseline.  Today the patient required CGA for bed mobility. Min A for lifting assistance required for standing. Patient ambulated 4ft with rolling walker with no significant shortness of breath, however she does report fatigue with activity. Sp02 91% on room air after ambulation bout. Recommend PT follow up to maximize independence and facilitate return to prior level of function in preparation for return home.       If plan is discharge home, recommend the following: Assist for transportation;Assistance with cooking/housework   Can travel by private vehicle        Equipment Recommendations None recommended by PT  Recommendations for Other Services       Functional Status Assessment Patient has had a recent decline in their functional status and demonstrates the ability to make significant improvements in function in a reasonable and predictable amount of time.     Precautions / Restrictions Precautions Precautions: Fall Restrictions Weight Bearing Restrictions: No      Mobility  Bed Mobility Overal bed mobility: Needs Assistance Bed Mobility: Supine to Sit, Sit to Supine     Supine to sit: Contact guard, HOB elevated Sit to supine: Contact guard assist, HOB elevated   General bed mobility comments: verbal cues for sequencing    Transfers Overall transfer level: Needs assistance Equipment used: Rolling walker (2 wheels) Transfers: Sit to/from Stand Sit to Stand: Min assist            General transfer comment: lifting assistance required for standing    Ambulation/Gait Ambulation/Gait assistance: Contact guard assist Gait Distance (Feet): 30 Feet Assistive device: Rolling walker (2 wheels) Gait Pattern/deviations: Step-through pattern, Narrow base of support Gait velocity: decreased     General Gait Details: patient ambulated in the room with CGA using rolling walker. no significant shortness of breath noted, however patient does report feeling fatigued with activity  Stairs            Wheelchair Mobility     Tilt Bed    Modified Rankin (Stroke Patients Only)       Balance Overall balance assessment: Mild deficits observed, not formally tested                                           Pertinent Vitals/Pain Pain Assessment Pain Assessment: No/denies pain    Home Living Family/patient expects to be discharged to:: Private residence Living Arrangements: Alone Available Help at Discharge: Family Type of Home: House Home Access: Level entry       Home Layout: One level;Laundry or work area in basement (basement is not required) Home Equipment: Agricultural consultant (2 wheels);Shower seat;BSC/3in1      Prior Function Prior Level of Function : Independent/Modified Independent             Mobility Comments: Mod I using rolling walker for short distance ambulation around the home ADLs Comments: daughter does laundry. Mod I with ADLs     Extremity/Trunk Assessment   Upper Extremity  Assessment Upper Extremity Assessment: Generalized weakness    Lower Extremity Assessment Lower Extremity Assessment: Generalized weakness       Communication   Communication Communication: No apparent difficulties  Cognition Arousal: Alert Behavior During Therapy: WFL for tasks assessed/performed Overall Cognitive Status: Within Functional Limits for tasks assessed                                          General  Comments General comments (skin integrity, edema, etc.): removed supplemental 02 during mobility. Sp02 at rest on room air -94%, with ambulation on room air 91%    Exercises     Assessment/Plan    PT Assessment Patient needs continued PT services  PT Problem List Decreased activity tolerance;Decreased balance;Decreased mobility;Decreased strength       PT Treatment Interventions DME instruction;Gait training;Stair training;Therapeutic activities;Functional mobility training;Therapeutic exercise;Neuromuscular re-education;Balance training;Cognitive remediation;Patient/family education    PT Goals (Current goals can be found in the Care Plan section)  Acute Rehab PT Goals Patient Stated Goal: to have more energy PT Goal Formulation: With patient Time For Goal Achievement: 12/12/22 Potential to Achieve Goals: Good    Frequency Min 1X/week     Co-evaluation               AM-PAC PT "6 Clicks" Mobility  Outcome Measure Help needed turning from your back to your side while in a flat bed without using bedrails?: None Help needed moving from lying on your back to sitting on the side of a flat bed without using bedrails?: A Little Help needed moving to and from a bed to a chair (including a wheelchair)?: A Little Help needed standing up from a chair using your arms (e.g., wheelchair or bedside chair)?: A Little Help needed to walk in hospital room?: A Little Help needed climbing 3-5 steps with a railing? : A Little 6 Click Score: 19    End of Session Equipment Utilized During Treatment: Gait belt Activity Tolerance: Patient tolerated treatment well Patient left: in bed;with call bell/phone within reach;with bed alarm set;with family/visitor present Nurse Communication: Mobility status PT Visit Diagnosis: Unsteadiness on feet (R26.81);Muscle weakness (generalized) (M62.81)    Time: 2956-2130 PT Time Calculation (min) (ACUTE ONLY): 22 min   Charges:   PT Evaluation $PT  Eval Low Complexity: 1 Low PT Treatments $Therapeutic Activity: 8-22 mins PT General Charges $$ ACUTE PT VISIT: 1 Visit         Donna Bernard, PT, MPT   Frances Kent 11/28/2022, 10:35 AM

## 2022-11-28 NOTE — Progress Notes (Signed)
*  PRELIMINARY RESULTS* Echocardiogram 2D Echocardiogram has been performed.  Carolyne Fiscal 11/28/2022, 10:29 AM

## 2022-11-28 NOTE — Evaluation (Signed)
Occupational Therapy Evaluation Patient Details Name: Frances Kent MRN: [id number] DOB: [birth year] Today's Date: 11/28/2022   History of Present Illness 89 year old female referred for evaluation of A-Fib and CHF. PMHx of chronic a-fib, pacemaker, HTN, HLD, CVA, and hypothyroidism. She has had a several week history of shortness of breath, productive cough treated as outpatient for bronchitis.  She presents to [facility] emergency room with chief complaint of generalized weakness and shortness of breath.   Clinical Impression   Pt was seen for OT evaluation this date. Prior to hospital admission, pt was living alone and IND with ADLs/simple IADLs. She ambulated with a RW and daughter lives nearby and came in daily to check in, do her laundry and bring meals in.   Pt presents to acute OT demonstrating impaired ADL performance and functional mobility 2/2 SOB/limited activity tolerance, weakness and slight balance deficits (See OT problem list for additional functional deficits). Pt taken off 02 for eval, dropped to 88% by end of session following mobility, etc and placed back on 1L. Pt currently requires CGA/SBA for bed mobility. She demo STS from EOB with CGA and performed oral hygiene standing at sink with unilateral support and CGA. She ambulated to bathroom using RW with CGA and demo toilet transfer to [facility] over top of toilet using grab bar with CGA. Pt returned to bed with all needs in place. Pt would benefit from skilled OT services to address noted impairments and functional limitations (see below for any additional details) in order to maximize safety and independence while minimizing falls risk and caregiver burden. Recommended life alert to pt and her daughter-pt states she has one that she can set up. HHOT consult recommended.    If plan is discharge home, recommend the following:      Functional Status Assessment  Patient has had a recent decline in their functional status and demonstrates  the ability to make significant improvements in function in a reasonable and predictable amount of time.  Equipment Recommendations   (life alert to be set up as pt has it at home)    Recommendations for Other Services       Precautions / Restrictions Precautions Precautions: Fall Restrictions Weight Bearing Restrictions: No      Mobility Bed Mobility Overal bed mobility: Needs Assistance Bed Mobility: Supine to Sit, Sit to Supine     Supine to sit: Contact guard, HOB elevated, Supervision Sit to supine: Contact guard assist, HOB elevated, Supervision        Transfers Overall transfer level: Needs assistance Equipment used: Rolling walker (2 wheels) Transfers: Sit to/from Stand Sit to Stand: Contact guard assist                  Balance Overall balance assessment: Mild deficits observed, not formally tested                                         ADL either performed or assessed with clinical judgement   ADL Overall ADL's : Needs assistance/impaired     Grooming: Oral care;Contact guard assist;Supervision/safety;Standing                   Toilet Transfer: Contact guard assist;BSC/3in1;Grab bars Toilet Transfer Details (indicate cue type and reason): BSC over toilet         Functional mobility during ADLs: Contact guard assist;Supervision/safety;Rolling walker (2 wheels)  Vision         Perception         Praxis         Pertinent Vitals/Pain Pain Assessment Pain Assessment: No/denies pain     Extremity/Trunk Assessment Upper Extremity Assessment Upper Extremity Assessment: Generalized weakness   Lower Extremity Assessment Lower Extremity Assessment: Generalized weakness       Communication Communication Communication: No apparent difficulties   Cognition Arousal: Alert Behavior During Therapy: WFL for tasks assessed/performed Overall Cognitive Status: Within Functional Limits for tasks assessed                                        General Comments  sp02 removed for session, 88% at lowest following mobility to bathroom and hygiene at sink then placed back on 1L    Exercises     Shoulder Instructions      Home Living Family/patient expects to be discharged to:: Private residence Living Arrangements: Alone Available Help at Discharge: Family Type of Home: House Home Access: Level entry     Home Layout: One level;Laundry or work area in basement (pt does not go downstairs)     Bathroom Shower/Tub: Producer, television/film/video: Standard (with BSC over top and grab bars)     Home Equipment: Agricultural consultant (2 wheels);Shower seat;BSC/3in1          Prior Functioning/Environment Prior Level of Function : Independent/Modified Independent             Mobility Comments: Mod I using rolling walker for short distance ambulation around the home ADLs Comments: daughter does laundry. Mod I with ADLs and able to make sandwiches, soups, light meals        OT Problem List: Decreased strength;Decreased activity tolerance;Impaired balance (sitting and/or standing);Cardiopulmonary status limiting activity      OT Treatment/Interventions: Self-care/ADL training;Therapeutic exercise;Patient/family education;Energy conservation;Balance training;Therapeutic activities;DME and/or AE instruction    OT Goals(Current goals can be found in the care plan section) Acute Rehab OT Goals Patient Stated Goal: return home OT Goal Formulation: With patient Time For Goal Achievement: 12/12/22 Potential to Achieve Goals: Good ADL Goals Pt Will Perform Lower Body Bathing: sit to/from stand;with supervision Pt Will Perform Lower Body Dressing: with supervision;sit to/from stand;sitting/lateral leans Pt Will Transfer to Toilet: with modified independence;grab bars;bedside commode Pt Will Perform Toileting - Clothing Manipulation and hygiene: with supervision;sit to/from  stand Additional ADL Goal #1: Pt will verbalize/demo energy conservation/copmensatory strategies during ADL performance to promote improved ease/safety.  OT Frequency: Min 1X/week    Co-evaluation              AM-PAC OT "6 Clicks" Daily Activity     Outcome Measure Help from another person eating meals?: None Help from another person taking care of personal grooming?: None Help from another person toileting, which includes using toliet, bedpan, or urinal?: A Little Help from another person bathing (including washing, rinsing, drying)?: A Little Help from another person to put on and taking off regular upper body clothing?: None Help from another person to put on and taking off regular lower body clothing?: A Little 6 Click Score: 21   End of Session Equipment Utilized During Treatment: Rolling walker (2 wheels);Gait belt Nurse Communication: Mobility status  Activity Tolerance: Patient tolerated treatment well Patient left: in bed;with call bell/phone within reach;with bed alarm set;with family/visitor present  OT Visit Diagnosis: Muscle weakness (  generalized) (M62.81);Other abnormalities of gait and mobility (R26.89)                Time: 7829-5621 OT Time Calculation (min): 24 min Charges:  OT General Charges $OT Visit: 1 Visit OT Evaluation $OT Eval Low Complexity: 1 Low OT Treatments $Self Care/Home Management : 8-22 mins Ashlley Booher, OTR/L 11/28/22, 12:54 PM  Kaylani Fromme E Kevan Prouty 11/28/2022, 12:48 PM

## 2022-11-28 NOTE — Progress Notes (Signed)
Progress Note   Patient: Frances Kent IEP:[id number] DOB: 22-Nov-1930 DOA: 11/26/2022     2 DOS: the patient was seen and examined on 11/28/2022   Brief hospital course: 89yo with h/o PAF on Xarelto, pacemaker placement, HTN, chronic systolic CHF, HLD, CVA, and hypothyroidism who presented on 10/12 with generalized weakness and SOB.  CXR with prominent B interstitial opacities and B lung base opacities concerning for infection.  Given ceftriaxone and azithromycin, doxycycline.  Negative procalcitonin, antibiotics stopped.  HR uncontrolled in 100-120s, likely for several weeks; suspect this + known systolic CHF has created chronic pulmonary edema as the source of her respiratory issues.  Cardiology consulted.    Assessment and Plan:  Acute on chronic systolic CHF -Patient with known h/o chronic systolic CHF presenting with worsening SOB and marginal hypoxia (documented to 90%) -CXR consistent with mild pulmonary edema -Mildly elevated BNP when compared with known baseline -With elevated BNP and abnl CXR, acute decompensated CHF seems probable as diagnosis -Admitted to cardiac tele, as per the Emergency HF Mortality Risk Grade.  The patient has:  pulmonary edema requiring new O2 therapy -Echocardiogram with EF 40-45% in 2022 and currently, moderate MR -Changed from diltiazem to metoprolol given reduced EF -Also on low-dose amiodarone -CHF order set utilized -Cardiology consulted (Dr. Darrold Junker) -Was given Lasix 20 mg x 1 in ER and will repeat with 40 mg IV BID, possible transition to PO tomorrow -Continue [place] O2 for now, down to 1L and may be able to wean off soon -Respiratory issues appear to be more cardiac in nature at this time, given several antibiotics without relief and negative WBC count and procalcitonin   Atrial Fibrillation  -Patient presenting with known afib; has likely been in mild RVR at least periodically since at least 10/3 visit -Remains with HR in 110s currently, which  is likely increasing the demand on her heart and exacerbating her CHF (above) -Given the depressed EF seen previously will attempt to avoid Cardizem and will start low-dose metoprolol; amiodarone also resumed (QTc reviewed, 460) -Cardiology consulted -HS troponin negative x 2 -Continue home Xarelto   Dysphagia -Patient has a h/o esophageal and oropharyngeal dysphagia in the past -She is noticing difficulty swallowing pills and foods, is on soft diet without meats at home -Aspiration is a consideration but would be more likely aspiration pneumonitis than infection if present -Speech therapy consulted, patient is at baseline and has very effective strategies for dealing with this issue -No further evaluation/treatment needed   HTN -On Diltiazem PTA, but this was stopped -Now on low-dose metoprolol -Will also add prn hydralazine   HLD -Statin intolerant   Hypothyroidism -Continue Synthroid   Mood d/o -Continue mirtazapine   Stage 3b CKD -Appears to be stable at this time -Attempt to avoid nephrotoxic medications -Recheck BMP in AM    GOC -Currently wants to be full code -Has been trying to arrange for palliative care at home -Lives alone, recommended for [facility] PT/OT -Palliative and PT/OT and [facility] consults requested       Consultants: Cardiology Palliative care PT/OT RT SLP [facility] team   Procedures: Echocardiogram   Antibiotics: Rocephin x 1 Doxycycline x 1   30 Day Unplanned Readmission Risk Score    Flowsheet Row ED to Hosp-Admission (Current) from 11/26/2022 in [facility] PCU  30 Day Unplanned Readmission Risk Score (%) 15.08 Filed at 11/28/2022 0401       This score is the patient's risk of an unplanned readmission within 30 days  of being discharged (0 -100%). The score is based on dignosis, age, lab data, medications, orders, and past utilization.   Low:  0-14.9   Medium: 15-21.9   High: 22-29.9   Extreme: 30 and above            Subjective: Feeling better, breathing better.  Some ongoing cough.  Has mostly fatigue at this time.   Objective: Vitals:   11/28/22 1141 11/28/22 1603  BP: (!) 101/59 110/73  Pulse: (!) 49 (!) 109  Resp: 17 19  Temp: 98.3 F (36.8 C) 99.1 F (37.3 C)  SpO2: 96% 95%    Intake/Output Summary (Last 24 hours) at 11/28/2022 1709 Last data filed at 11/28/2022 1421 Gross per 24 hour  Intake 0 ml  Output 1350 ml  Net -1350 ml   Filed Weights   11/26/22 1407 11/28/22 1100  Weight: 54.9 kg 55.3 kg    Exam:  General:  Appears frail but perkier than yesterday, on 1L [place] O2 Eyes:   EOMI, normal lids, iris ENT:  grossly normal hearing, lips & tongue, mmm Neck:  no LAD, masses or thyromegaly Cardiovascular:  Irregularly irregular without tachycardia. No LE edema.  Respiratory:   CTAB.  Normal respiratory effort. Abdomen:  soft, NT, ND Skin:  no rash or induration seen on limited exam Musculoskeletal:  grossly normal tone BUE/BLE, good ROM, no bony abnormality Psychiatric:  blunted mood and affect, speech fluent and appropriate, AOx3 Neurologic:  CN 2-12 grossly intact, moves all extremities in coordinated fashion  Data Reviewed: I have reviewed the patient's lab results since admission.  Pertinent labs for today include:   K+ 3.2 BUN 14/Creatinine 1.11/GFR 47 - stable WBC 7.8  Hgb 11.9   Family Communication: Sister was present throughout evaluation  Disposition: Status is: Inpatient Remains inpatient appropriate because: ongoing diuresis     Time spent: 50 minutes  Unresulted Labs (From admission, onward)     Start     Ordered   11/29/22 0500  Basic metabolic panel  Tomorrow morning,   R        11/28/22 1535   11/29/22 0500  CBC with Differential/Platelet  Tomorrow morning,   R        11/28/22 1709             Author: Jonah Blue, MD 11/28/2022 5:09 PM  For on call review [email or web address].

## 2022-11-28 NOTE — TOC Progression Note (Signed)
Transition of Care [facility]) - Progression Note    Patient Details  Name: Frances Kent MRN: [id number] Date of Birth: 03-19-1930  Transition of Care [facility]) CM/SW Contact  Truddie Hidden, RN Phone Number: 11/28/2022, 3:28 PM  Clinical Narrative:    Spoke with patient and her daughter at the bedside regarding therapy recommendation for [facility]. RNCM explained what to expect from therapy at home as well and process of referrals and insurance.Patient inquire about co-pays. She had been advised to follow up with her insurance regarding co-pays as they vary from plan to plan. She is agreeable to HHPT and would like [facility]. She was informed a representative would contact her at discharge to schedule an appointment with in 48 hours. [name] will transport her home.   Referral sent to [place] [facility] [place].         Expected Discharge Plan and Services                                               Social Determinants of Health (SDOH) Interventions SDOH Screenings   Food Insecurity: No Food Insecurity (11/27/2022)  Housing: Low Risk  (11/27/2022)  Transportation Needs: No Transportation Needs (11/27/2022)  Utilities: Not At Risk (11/27/2022)  Depression (PHQ2-9): Low Risk  (11/22/2022)  Financial Resource Strain: Low Risk  (06/11/2021)  Social Connections: Unknown (06/11/2021)  Stress: No Stress Concern Present (06/11/2021)  Tobacco Use: Low Risk  (11/28/2022)    Readmission Risk Interventions    02/16/2021    3:33 PM 01/14/2021   10:39 AM 11/01/2020   10:42 AM  Readmission Risk Prevention Plan  Transportation Screening Complete Complete Complete  PCP or Specialist Appt within 5-7 Days   Complete  Medication Review (RN CM)   Complete  Medication Review [employer]) Complete Complete   PCP or Specialist appointment within 3-5 days of discharge  Complete   HRI or Home Care Consult Complete Complete   SW Recovery Care/Counseling Consult Complete Complete    Palliative Care Screening Not Applicable Not Applicable   Skilled Nursing Facility  Not Applicable

## 2022-11-28 NOTE — Evaluation (Signed)
Clinical/Bedside Swallow Evaluation Patient Details  Name: Frances Kent MRN: [id number] Date of Birth: 03-03-1930  Today's Date: 11/28/2022 Time: SLP Start Time (ACUTE ONLY): 0945 SLP Stop Time (ACUTE ONLY): 1045 SLP Time Calculation (min) (ACUTE ONLY): 60 min  Past Medical History:  Past Medical History:  Diagnosis Date   Atrial fibrillation (HCC)    Benign breast cyst in female, left 10/07/2016   Candida esophagitis (HCC) 05/03/2021   Found on Mar 10 2021 EGD    CAP (community acquired pneumonia) 02/15/2021   Colon adenomas    GERD (gastroesophageal reflux disease)    HCAP (healthcare-associated pneumonia) 02/02/2021   Hypertension    Hypothyroidism    Macular degeneration    Mitral regurgitation    Pulmonary embolism (HCC) 02/2016   Severe sepsis (HCC) 02/02/2021   Stroke (HCC) 10/2020   SVT (supraventricular tachycardia) (HCC)    Past Surgical History:  Past Surgical History:  Procedure Laterality Date   APPENDECTOMY  1960   BREAST CYST ASPIRATION Left 2017   CARDIOVERSION N/A 04/05/2016   Procedure: Cardioversion;  Surgeon: Lamar Blinks, MD;  Location: [facility];  Service: Cardiovascular;  Laterality: N/A;   CHOLECYSTECTOMY  1985   ESOPHAGOGASTRODUODENOSCOPY N/A 03/10/2021   Procedure: ESOPHAGOGASTRODUODENOSCOPY (EGD);  Surgeon: Toledo, Boykin Nearing, MD;  Location: [facility];  Service: Gastroenterology;  Laterality: N/A;   ESOPHAGOGASTRODUODENOSCOPY (EGD) WITH PROPOFOL N/A 11/27/2019   Procedure: ESOPHAGOGASTRODUODENOSCOPY (EGD) WITH PROPOFOL;  Surgeon: Regis Bill, MD;  Location: [facility];  Service: Endoscopy;  Laterality: N/A;   ESOPHAGOGASTRODUODENOSCOPY (EGD) WITH PROPOFOL N/A 12/24/2019   Procedure: ESOPHAGOGASTRODUODENOSCOPY (EGD) WITH PROPOFOL;  Surgeon: Regis Bill, MD;  Location: [facility];  Service: Endoscopy;  Laterality: N/A;   IR CT HEAD LTD  11/01/2020   IR PERCUTANEOUS ART THROMBECTOMY/INFUSION INTRACRANIAL INC DIAG ANGIO   11/01/2020   IR US GUIDE VASC ACCESS RIGHT  11/02/2020   OVARIAN CYST REMOVAL     PACEMAKER LEADLESS INSERTION N/A 11/16/2021   Procedure: PACEMAKER LEADLESS INSERTION;  Surgeon: Marcina Millard, MD;  Location: [facility];  Service: Cardiovascular;  Laterality: N/A;   RADIOLOGY WITH ANESTHESIA N/A 11/01/2020   Procedure: RADIOLOGY WITH ANESTHESIA;  Surgeon: Julieanne Cotton, MD;  Location: [facility];  Service: Radiology;  Laterality: N/A;   TEE WITHOUT CARDIOVERSION N/A 02/01/2018   Procedure: TRANSESOPHAGEAL ECHOCARDIOGRAM (TEE);  Surgeon: Lamar Blinks, MD;  Location: [facility];  Service: Cardiovascular;  Laterality: N/A;   HPI:  Pt is a 89 year old female referred for evaluation of A-Fib and CHF. PMHx of chronic a-fib, pacemaker, HTN, HLD, CVA, and hypothyroidism. She has had a several week history of shortness of breath, productive cough treated as outpatient for bronchitis.  She presents to [facility] emergency room with chief complaint of generalized weakness and shortness of breath. Prior to hospital admission, pt was living alone and IND with ADLs/simple IADLs. She ambulates with a RW and daughter lives nearby and came in daily to check in, do her laundry and bring meals in.   CXR: Prominent bilateral interstitial opacities could represent pulmonary venous congestion, CHF or atypical infection.  Cardiology is following/treating for Chronic atrial fibrillation, with RVR.    OF NOTE: pt has a GI history as per chart notes -- per her chart in 2021, she had a EGD: "Food was found in the lower third of the esophagus. The food was extremely soft and mushed. It was very easily pushed into stomach.  Findings:  A mild Schatzki ring was found in the lower  third of the esophagus.  Patchy mild inflammation characterized by erythema was found in the stomach.".    Assessment / Plan / Recommendation  Clinical Impression    Pt seen for BSE this morning. Daughter present. Pt A/O x4; engaged easily and  described following precautions when eating and Pill swallowing d/t the Baseline Esophageal phase Dysmotility -- "I don't eat meats, but if I do try them, I try a small piece cut up really well to see how it goes, then I don't take another bite if it doesn't go down" (pointing to her Esophagus). Pt was verbal and answered all questions/followed commands appropriately.   On  O2 support- 1L; afebrile, WBC WNL.    OF NOTE: Pt endorses s/s of Esophageal phase Dysmotility w/ certain foods; REFLUX s/s at home. She has had her Esophagus Dilated per report. MBSS 2023 revealed: "pt appears to present w/ grossly adequate oropharyngeal phase swallowing function, though Timeliness of oral phase A-P transfer was inconsistent w/ a slight-min delay d/t pt's hesitancy and trepidation of swallowing. This is Baseline per pt for several weeks++. No apparent pharyngeal phase dysphagia noted at this evaluation today. No aspiration noted. No neurological history but h/o Esophageal phase Dysmotility.". GI f/u for ongoing management and treatment was recommended at that time in setting of pt's complaints and her PMH of stricture/dilation.     Pt appears to present w/ functional oropharyngeal phase swallowing at this assessment today w/ No overt oropharyngeal phase dysphagia nor s/s of aspiration appreciated during oral intake of trials, no neuromuscular swallowing deficits appreciated.  Pt appears at reduced risk for aspiration from an oropharyngeal phase standpoint following general aspiration precautions. HOWEVER, pt has a baseline presentation of REFLUX and Esophageal phase Dysmotility. ANY Dysmotility or Regurgitation of Reflux material can increase risk for aspiration of the Reflux material during Retrograde flow thus impact Voicing and Pulmonary status.    Pt sat upright in bed and consumed several trials of thin liquids Via Cup, purees, and solid foods w/ No overt clinical s/s of aspiration noted; clear vocal quality b/t  trials, no decline in pulmonary status, no multiple swallows nor coughing noted. Oral phase appeared [facility] for bolus management and timely A-P transfer/clearing of material. Mastication appropriate for boluses. OM exam was [facility] for oral clearing; lingual/labial movements. No unilateral weakness. Speech clear.    Recommend continue the more Mech Soft diet consistency(moistened foods cut small) w/ thin liquids. General aspiration precautions. Rest Breaks during meals/oral intake to allow for Esophageal clearing. REFLUX precautions recommended to lessen chance for Regurgitation -- HOB elevated at night when sleeping. Choose foods that are easily broken-down and pass easily through the Esophagus -- less meats, breads recommended. Less straw use d/t air swallowed.    Recommend pt f/u w/ GI for ongoing management of Reflux and/or Esophageal phase Dysmotility as needed. Discussion and handouts given on general aspiration precautions/REFLUX. MD to reconsult ST services if any new needs while admitted. NSG updated. Pt appreciative of Education information. SLP Visit Diagnosis: Dysphagia, unspecified (R13.10) (baseline of Esophageal phase Dysmotility)    Aspiration Risk   (reduced following general precautions)    Diet Recommendation   Thin;Dysphagia 3 (mechanical soft) (moistened foods) - continue the more Mech Soft diet consistency(moistened foods cut small) w/ thin liquids. General aspiration precautions. Rest Breaks during meals/oral intake to allow for Esophageal clearing. REFLUX precautions recommended to lessen chance for Regurgitation -- HOB elevated at night when sleeping. Choose foods that are easily broken-down and pass easily through the  Esophagus -- less meats, breads recommended. Less straw use d/t air swallowed.   Medication Administration: Whole meds with puree or food (baseline for pt)    Other  Recommendations Recommended Consults: Consider GI evaluation;Consider esophageal assessment (as needed;  Dietician f/u.) Oral Care Recommendations: Oral care BID;Patient independent with oral care (denture care)    Recommendations for follow up therapy are one component of a multi-disciplinary discharge planning process, led by the attending physician.  Recommendations may be updated based on patient status, additional functional criteria and insurance authorization.  Follow up Recommendations No SLP follow up      Assistance Recommended at Discharge  intermittent  Functional Status Assessment Patient has had a recent decline in their functional status and/or demonstrates limited ability to make significant improvements in function in a reasonable and predictable amount of time  Frequency and Duration  (n/a)   (n/a)       Prognosis Prognosis for improved oropharyngeal function: Fair Barriers to Reach Goals: Time post onset;Severity of deficits Barriers/Prognosis Comment: baseline of Esophageal phase Dysmotility      Swallow Study   General Date of Onset: 11/26/22 HPI: Pt is a 89 year old female referred for evaluation of A-Fib and CHF. PMHx of chronic a-fib, pacemaker, HTN, HLD, CVA, and hypothyroidism. She has had a several week history of shortness of breath, productive cough treated as outpatient for bronchitis.  She presents to [facility] emergency room with chief complaint of generalized weakness and shortness of breath. Prior to hospital admission, pt was living alone and IND with ADLs/simple IADLs. She ambulates with a RW and daughter lives nearby and came in daily to check in, do her laundry and bring meals in.   CXR: Prominent bilateral interstitial opacities could represent pulmonary venous congestion, CHF or atypical infection.  Cardiology is following/treating for Chronic atrial fibrillation, with RVR.   OF NOTE: pt has a GI history as per chart notes -- per her chart in 2021, she had a EGD: "Food was found in the lower third of the esophagus. The food was extremely soft and mushed. It was  very easily pushed into stomach.  Findings:  A mild Schatzki ring was found in the lower third of the esophagus.  Patchy mild inflammation characterized by erythema was found in the stomach.". Type of Study: Bedside Swallow Evaluation Previous Swallow Assessment: 2022; 2023 - mbss (mech soft diet w/ thin liquids) Diet Prior to this Study: Dysphagia 3 (mechanical soft);Thin liquids (Level 0) (baseline) Temperature Spikes Noted: No (wbc 7.8) Respiratory Status: Nasal cannula (1L) History of Recent Intubation: No Behavior/Cognition: Alert;Cooperative;Pleasant mood (Dtr present) Oral Cavity Assessment: Within Functional Limits Oral Care Completed by SLP: Recent completion by staff Oral Cavity - Dentition: Dentures, top;Dentures, bottom (present) Vision: Functional for self-feeding Self-Feeding Abilities: Able to feed self;Needs set up Patient Positioning: Upright in bed (supported for improved upright sitting) Baseline Vocal Quality: Normal Volitional Cough: Strong Volitional Swallow: Able to elicit    Oral/Motor/Sensory Function Overall Oral Motor/Sensory Function: Within functional limits   Ice Chips Ice chips: Within functional limits Presentation: Spoon (fed, 2 trials)   Thin Liquid Thin Liquid: Within functional limits Presentation: Cup;Self Fed (9 trials)    Nectar Thick Nectar Thick Liquid: Not tested   Honey Thick Honey Thick Liquid: Not tested   Puree Puree: Within functional limits Presentation: Self Fed;Spoon (6 trials)   Solid     Solid: Within functional limits Presentation: Spoon (fed; 5 trials) Other Comments: moistened well       Jerilynn Som,  MS, CCC-SLP Speech Language Pathologist Rehab Services; [facility] -  [phone] (ascom) Saraya Tirey 11/28/2022,3:29 PM

## 2022-11-28 NOTE — Progress Notes (Signed)
[facility] CARDIOLOGY CARDIOLOGY CONSULT NOTE  Patient ID: Frances Kent MRN: [id number] DOB/AGE: 1930-02-23 89 y.o.  Admit date: 11/26/2022 Referring Physician Allena Katz Primary Physician [facility] Primary Cardiologist Paraschos Reason for Consultation atrial fibrillation  HPI: 89 year old female referred for evaluation of atrial fibrillation and possible congestive heart failure.  The patient has a history of chronic atrial fibrillation, on Xarelto for stroke prevention.  She is status post Micra AV leadless pacemaker for sick sinus syndrome.  She has had a several week history of shortness of breath, productive cough treated as outpatient for bronchitis.  She presents to [facility] emergency room with chief complaint of generalized weakness and shortness of breath.  The emergency room patient noted to be in atrial fibrillation at a rate of 108 bpm.  Chest x-ray revealed lateral interstitial opacities consistent with pneumonia versus congestive heart failure.  Admission labs noted for mildly elevated BNP 460.7.  2D echocardiogram 11/06/2019 revealed LVEF of 40%.  72-hour Holter monitor 07/20/2022 revealed atrial fibrillation with mean heart rate of 74 bpm with heart rate range 49 to 127 bpm.  The patient is most recently been on Cardizem CD 120 mg for rate control.  Interval history: -Patient feeling ok this AM, states about the same as yesterday.  -SOB stable, remains on 1L [place]. Denies chest pain. Net negative 760 mL.  -HR relatively controlled, tolerating metoprolol and amiodarone. Denies palpitations.   Review of systems complete and found to be negative unless listed above     Past Medical History:  Diagnosis Date   Atrial fibrillation (HCC)    Benign breast cyst in female, left 10/07/2016   Candida esophagitis (HCC) 05/03/2021   Found on Mar 10 2021 EGD    CAP (community acquired pneumonia) 02/15/2021   Colon adenomas    GERD (gastroesophageal reflux disease)    HCAP (healthcare-associated pneumonia)  02/02/2021   Hypertension    Hypothyroidism    Macular degeneration    Mitral regurgitation    Pulmonary embolism (HCC) 02/2016   Severe sepsis (HCC) 02/02/2021   Stroke (HCC) 10/2020   SVT (supraventricular tachycardia) (HCC)     Past Surgical History:  Procedure Laterality Date   APPENDECTOMY  1960   BREAST CYST ASPIRATION Left 2017   CARDIOVERSION N/A 04/05/2016   Procedure: Cardioversion;  Surgeon: Lamar Blinks, MD;  Location: [facility] ORS;  Service: Cardiovascular;  Laterality: N/A;   CHOLECYSTECTOMY  1985   ESOPHAGOGASTRODUODENOSCOPY N/A 03/10/2021   Procedure: ESOPHAGOGASTRODUODENOSCOPY (EGD);  Surgeon: Toledo, Boykin Nearing, MD;  Location: [facility] ENDOSCOPY;  Service: Gastroenterology;  Laterality: N/A;   ESOPHAGOGASTRODUODENOSCOPY (EGD) WITH PROPOFOL N/A 11/27/2019   Procedure: ESOPHAGOGASTRODUODENOSCOPY (EGD) WITH PROPOFOL;  Surgeon: Regis Bill, MD;  Location: [facility] ENDOSCOPY;  Service: Endoscopy;  Laterality: N/A;   ESOPHAGOGASTRODUODENOSCOPY (EGD) WITH PROPOFOL N/A 12/24/2019   Procedure: ESOPHAGOGASTRODUODENOSCOPY (EGD) WITH PROPOFOL;  Surgeon: Regis Bill, MD;  Location: [facility] ENDOSCOPY;  Service: Endoscopy;  Laterality: N/A;   IR CT HEAD LTD  11/01/2020   IR PERCUTANEOUS ART THROMBECTOMY/INFUSION INTRACRANIAL INC DIAG ANGIO  11/01/2020   IR US GUIDE VASC ACCESS RIGHT  11/02/2020   OVARIAN CYST REMOVAL     PACEMAKER LEADLESS INSERTION N/A 11/16/2021   Procedure: PACEMAKER LEADLESS INSERTION;  Surgeon: Marcina Millard, MD;  Location: [facility] INVASIVE CV LAB;  Service: Cardiovascular;  Laterality: N/A;   RADIOLOGY WITH ANESTHESIA N/A 11/01/2020   Procedure: RADIOLOGY WITH ANESTHESIA;  Surgeon: Julieanne Cotton, MD;  Location: [facility] OR;  Service: Radiology;  Laterality: N/A;   TEE WITHOUT CARDIOVERSION  N/A 02/01/2018   Procedure: TRANSESOPHAGEAL ECHOCARDIOGRAM (TEE);  Surgeon: Lamar Blinks, MD;  Location: [facility] ORS;  Service: Cardiovascular;  Laterality: N/A;     Medications Prior to Admission  Medication Sig Dispense Refill Last Dose   acetaminophen (TYLENOL) 500 MG tablet Take 500 mg by mouth every 6 (six) hours as needed.   prn   amiodarone (PACERONE) 200 MG tablet Take 0.5 tablets (100 mg total) by mouth daily. Home med.   11/26/2022 at 0930   benzonatate (TESSALON) 200 MG capsule Take 1 capsule (200 mg total) by mouth 3 (three) times daily as needed for cough. 60 capsule 0 prn   diltiazem (CARDIZEM CD) 180 MG 24 hr capsule Take 180 mg by mouth daily.   11/26/2022 at 0930   furosemide (LASIX) 20 MG tablet TAKE 1 TABLET BY MOUTH EVERY OTHER DAY 45 tablet 0 11/26/2022 at 0930   levalbuterol (XOPENEX) 0.63 MG/3ML nebulizer solution USE 1  VIAL IN NEBULIZER EVERY 4 HOURS AS NEEDED FOR WHEEZING AND FOR SHORTNESS OF BREATH 150 mL 0 prn   levothyroxine (SYNTHROID) 50 MCG tablet TAKE 1 TABLET BY MOUTH ONCE DAILY BEFORE BREAKFAST 90 tablet 0 11/26/2022 at 0900   mirtazapine (REMERON) 15 MG tablet TAKE 1 TABLET BY MOUTH AT BEDTIME 90 tablet 0 11/25/2022 at 2200   pantoprazole (PROTONIX) 20 MG tablet Take 1 tablet (20 mg total) by mouth 2 (two) times daily before a meal. 180 tablet 0 11/26/2022 at 0930   potassium chloride SA (KLOR-CON M) 20 MEQ tablet Take 20 mEq by mouth every other day.   11/26/2022 at 0930   Probiotic CHEW Chew 2 tablets by mouth daily.   11/26/2022 at 1200   XARELTO 15 MG TABS tablet TAKE 1 TABLET EVERY DAY 90 tablet 3 11/25/2022 at 1800   clobetasol (TEMOVATE) 0.05 % external solution Apply 1 Application topically 2 (two) times daily. To scalp (Patient not taking: Reported on 11/26/2022) 50 mL 0 Not Taking   diltiazem (CARDIZEM LA) 240 MG 24 hr tablet Take 1 tablet (240 mg total) by mouth daily. (Patient not taking: Reported on 11/26/2022) 30 tablet 2 Not Taking   Multiple Vitamin (MULTIVITAMIN WITH MINERALS) TABS tablet Take 1 tablet by mouth daily. (Patient not taking: Reported on 11/26/2022)   Not Taking   triamcinolone (KENALOG) 0.025 %  ointment Apply 1 Application topically 2 (two) times daily. To eyelids and area under eyes (Patient not taking: Reported on 11/26/2022) 30 g 0 Not Taking   Social History   Socioeconomic History   Marital status: Widowed    Spouse name: Not on file   Number of children: Not on file   Years of education: Not on file   Highest education level: Not on file  Occupational History   Not on file  Tobacco Use   Smoking status: Never   Smokeless tobacco: Never   Tobacco comments:    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 , worked at [employer], Animator status: Never Used  Substance and Sexual Activity   Alcohol use: No   Drug use: No   Sexual activity: Not Currently  Other Topics Concern   Not on file  Social History Narrative   Has caretaking responsibility for 3 young grandchildren who live with her   Social Determinants of Health   Financial Resource Strain: Low Risk  (06/11/2021)   Overall Financial Resource Strain (CARDIA)    Difficulty of Paying Living Expenses: Not hard at all  Food Insecurity: No  Food Insecurity (11/27/2022)   Hunger Vital Sign    Worried About Running Out of Food in the Last Year: Never true    Ran Out of Food in the Last Year: Never true  Transportation Needs: No Transportation Needs (11/27/2022)   PRAPARE - Administrator, Civil Service (Medical): No    Lack of Transportation (Non-Medical): No  Physical Activity: Not on file  Stress: No Stress Concern Present (06/11/2021)   [employer] of Occupational Health - Occupational Stress Questionnaire    Feeling of Stress : Not at all  Social Connections: Unknown (06/11/2021)   Social Connection and Isolation Panel [NHANES]    Frequency of Communication with Friends and Family: More than three times a week    Frequency of Social Gatherings with Friends and Family: More than three times a week    Attends Religious Services: Not on file    Active Member of Clubs or Organizations: Not on file     Attends Banker Meetings: Not on file    Marital Status: Widowed  Intimate Partner Violence: Not At Risk (11/27/2022)   Humiliation, Afraid, Rape, and Kick questionnaire    Fear of Current or Ex-Partner: No    Emotionally Abused: No    Physically Abused: No    Sexually Abused: No    Family History  Problem Relation Age of Onset   Cancer Mother 107       breast cancer, lived to 110,    Breast cancer Mother 78   Cancer Son 34       pancreatic cancer   Heart disease Son        CAD, Tobacco Abuse    Heart disease Maternal Grandmother 66       died of massive MI   Stroke Sister       Review of systems complete and found to be negative unless listed above    PHYSICAL EXAM  General: Well developed, well nourished, in no acute distress sitting upright in hospital bed. HEENT:  Normocephalic and atramatic Neck:  No JVD.  Lungs: Clear bilaterally to auscultation and percussion. Heart: HRRR. Normal S1 and S2 without gallops or murmurs.  Abdomen: Bowel sounds are positive, abdomen soft and non-tender  Msk:  Back normal, normal gait. Normal strength and tone for age. Extremities: No clubbing, cyanosis or edema.   Neuro: Alert and oriented X 3. Psych:  Good affect, responds appropriately  Labs:   Lab Results  Component Value Date   WBC 7.8 11/28/2022   HGB 11.9 (L) 11/28/2022   HCT 36.1 11/28/2022   MCV 90.7 11/28/2022   PLT 319 11/28/2022    Recent Labs  Lab 11/26/22 1408 11/27/22 1159 11/28/22 0551  NA 140   < > 136  K 3.7   < > 3.2*  CL 108   < > 101  CO2 22   < > 25  BUN 19   < > 14  CREATININE 1.20*   < > 1.11*  CALCIUM 8.6*   < > 8.4*  PROT 7.5  --   --   BILITOT 0.8  --   --   ALKPHOS 126  --   --   ALT 21  --   --   AST 21  --   --   GLUCOSE 107*   < > 98   < > = values in this interval not displayed.   Lab Results  Component Value Date   CKTOTAL 83 05/09/2018  TROPONINI <0.03 02/25/2018    Lab Results  Component Value Date   CHOL  257 (H) 11/23/2021   CHOL 144 11/02/2020   CHOL 284 (H) 12/11/2019   Lab Results  Component Value Date   HDL 96.40 11/23/2021   HDL 48 11/02/2020   HDL 100.10 12/11/2019   Lab Results  Component Value Date   LDLCALC 140 (H) 11/23/2021   LDLCALC 81 11/02/2020   LDLCALC 166 (H) 12/11/2019   Lab Results  Component Value Date   TRIG 104.0 11/23/2021   TRIG 76 11/02/2020   TRIG 72 11/02/2020   Lab Results  Component Value Date   CHOLHDL 3 11/23/2021   CHOLHDL 3.0 11/02/2020   CHOLHDL 3 12/11/2019   Lab Results  Component Value Date   LDLDIRECT 185.3 03/27/2013   LDLDIRECT 156.1 03/23/2012   LDLDIRECT 181.4 01/17/2011      Radiology: DG Chest 2 View  Result Date: 11/26/2022 CLINICAL DATA:  bronchitis vs pneumonia EXAM: CHEST - 2 VIEW COMPARISON:  CXR 11/16/22 FINDINGS: No pleural effusion. No pneumothorax. Unchanged cardiac and mediastinal contours. Loop recorder device projects over the left hemithorax. Increased hazy opacity at the bilateral lung bases could represent atelectasis or infection. Redemonstrated prominent bilateral interstitial opacities that could represent atypical infection or pulmonary venous congestion. IMPRESSION: Prominent bilateral interstitial opacities could represent pulmonary venous congestion or atypical infection. There are more focal hazy opacities in the bilateral lung bases that could represent atelectasis or superimposed infection. Electronically Signed   By: Lorenza Cambridge M.D.   On: 11/26/2022 15:29   DG Chest 2 View  Result Date: 11/16/2022 CLINICAL DATA:  Cough and fatigue EXAM: CHEST - 2 VIEW COMPARISON:  Chest radiograph 08/04/2022 FINDINGS: A loop recorder is again noted. The cardiomediastinal silhouette is stable, with unchanged cardiomegaly. Coarsened interstitial markings throughout both lungs are again seen. There is new opacity projecting over the right midlung. Previously seen left upper lobe opacity has essentially resolved. There is no  pleural effusion or pneumothorax Compression deformity of the T11 vertebral body is unchanged. Cholecystectomy clips are noted. IMPRESSION: New opacities projecting over the right midlung suspicious for pneumonia. Note that these opacities have improved on the subsequently obtained radiograph dated 11/16/2022. Electronically Signed   By: Lesia Hausen M.D.   On: 11/16/2022 11:56   DG Chest 2 View  Result Date: 11/16/2022 CLINICAL DATA:  Persistent cough after treatment for pneumonia EXAM: CHEST - 2 VIEW COMPARISON:  Chest radiograph 11/02/2022 FINDINGS: A loop recorder is again noted.  The heart is enlarged, unchanged. There are coarsened interstitial markings throughout both lungs, stable. Previously seen opacities projecting over the right midlung have improved. There is no new or worsening focal airspace consolidation. There is no pleural effusion or pneumothorax There is unchanged compression deformity of the T11 vertebral body. There is no new acute osseous abnormality. Cholecystectomy clips are noted. IMPRESSION: 1. Similar coarsened interstitial markings throughout both lungs may reflect bronchitis or pulmonary interstitial edema. 2. Improved opacities projecting over the right midlung since the most recent prior study. No new or worsening focal airspace consolidation or pleural effusion. Electronically Signed   By: Lesia Hausen M.D.   On: 11/16/2022 11:55    EKG: Atrial fibrillation, right bundle branch block, 109 bpm  TELEMETRY (11/28/22): atrial fibrillation rate 90-100s  Data reviewed (11/28/22): last 24h vitals tele labs imaging I/O hospitalist progress note  ASSESSMENT AND PLAN:   1.  Chronic atrial fibrillation, with RVR, with most recent 72-hour Holter monitor 07/20/2022 revealed  mean heart rate 74 bpm, heart rate range 49 to 127 bpm, minimally symptomatic, with heart rate mildly elevated, could be compensatory to underlying bronchitis/pneumonia, previously on Cardizem CD 120 mg daily, now  on metoprolol tartrate 25 mg twice daily and low-dose amiodarone 100 mg daily 2.  Shortness of breath, with recent bronchitis, possible mild HFmpEF, with mildly elevated BNP (460.7), with chest x-ray revealing bilateral interstitial opacities, patient appears euvolemic, without peripheral edema 3.  Sick sinus syndrome, status post Micra AV 210/04/2021 4.  Moderate mitral stenosis/mitral regurgitation by 2D echocardiogram 11/07/2019  Recommendations  1.  Agree with overall current therapy 2.  Continue diuresis for now, consider transition to po lasix tomorrow 3.  Carefully monitor renal status 4.  Continue Xarelto for stroke prevention 5.  Continue metoprolol tartrate for rate control, consider increasing dose for improved rate control pending improvement of BP.  6.  Continue low-dose amiodarone 7.  Review 2D echocardiogram 8.  Further recommendations pending 2D echocardiogram results   Signed: Gale Journey, PA-C  11/28/2022, 10:38 AM

## 2022-11-29 ENCOUNTER — Other Ambulatory Visit (HOSPITAL_COMMUNITY): Payer: Self-pay

## 2022-11-29 ENCOUNTER — Telehealth: Payer: Self-pay

## 2022-11-29 ENCOUNTER — Other Ambulatory Visit: Payer: Self-pay | Admitting: *Deleted

## 2022-11-29 DIAGNOSIS — R531 Weakness: Secondary | ICD-10-CM

## 2022-11-29 DIAGNOSIS — I5023 Acute on chronic systolic (congestive) heart failure: Secondary | ICD-10-CM | POA: Diagnosis not present

## 2022-11-29 DIAGNOSIS — I5022 Chronic systolic (congestive) heart failure: Secondary | ICD-10-CM

## 2022-11-29 LAB — CBC WITH DIFFERENTIAL/PLATELET
Abs Immature Granulocytes: 0.03 10*3/uL (ref 0.00–0.07)
Basophils Absolute: 0.1 10*3/uL (ref 0.0–0.1)
Basophils Relative: 1 %
Eosinophils Absolute: 0.2 10*3/uL (ref 0.0–0.5)
Eosinophils Relative: 3 %
HCT: 35.4 % — ABNORMAL LOW (ref 36.0–46.0)
Hemoglobin: 11.4 g/dL — ABNORMAL LOW (ref 12.0–15.0)
Immature Granulocytes: 1 %
Lymphocytes Relative: 16 %
Lymphs Abs: 1.1 10*3/uL (ref 0.7–4.0)
MCH: 29.8 pg (ref 26.0–34.0)
MCHC: 32.2 g/dL (ref 30.0–36.0)
MCV: 92.7 fL (ref 80.0–100.0)
Monocytes Absolute: 0.8 10*3/uL (ref 0.1–1.0)
Monocytes Relative: 12 %
Neutro Abs: 4.4 10*3/uL (ref 1.7–7.7)
Neutrophils Relative %: 67 %
Platelets: 325 10*3/uL (ref 150–400)
RBC: 3.82 MIL/uL — ABNORMAL LOW (ref 3.87–5.11)
RDW: 15.3 % (ref 11.5–15.5)
WBC: 6.6 10*3/uL (ref 4.0–10.5)
nRBC: 0 % (ref 0.0–0.2)

## 2022-11-29 LAB — BASIC METABOLIC PANEL
Anion gap: 11 (ref 5–15)
BUN: 17 mg/dL (ref 8–23)
CO2: 25 mmol/L (ref 22–32)
Calcium: 8.5 mg/dL — ABNORMAL LOW (ref 8.9–10.3)
Chloride: 102 mmol/L (ref 98–111)
Creatinine, Ser: 1.09 mg/dL — ABNORMAL HIGH (ref 0.44–1.00)
GFR, Estimated: 48 mL/min — ABNORMAL LOW (ref >60)
Glucose, Bld: 95 mg/dL (ref 70–99)
Potassium: 3.3 mmol/L — ABNORMAL LOW (ref 3.5–5.1)
Sodium: 138 mmol/L (ref 135–145)

## 2022-11-29 MED ORDER — FUROSEMIDE 20 MG PO TABS: 20.0000 mg | ORAL_TABLET | Freq: Every day | ORAL | 1 refills | Status: DC

## 2022-11-29 MED ORDER — LISINOPRIL 5 MG PO TABS
2.5000 mg | ORAL_TABLET | Freq: Every day | ORAL | Status: DC
  Administered 2022-11-29: 2.5 mg via ORAL
  Filled 2022-11-29: qty 1

## 2022-11-29 MED ORDER — GUAIFENESIN ER 600 MG PO TB12: 600.0000 mg | ORAL_TABLET | Freq: Two times a day (BID) | ORAL | 1 refills | Status: DC | PRN

## 2022-11-29 MED ORDER — FUROSEMIDE 20 MG PO TABS: 20.0000 mg | ORAL_TABLET | Freq: Every day | ORAL | Status: DC

## 2022-11-29 MED ORDER — METOPROLOL SUCCINATE ER 25 MG PO TB24: 25.0000 mg | ORAL_TABLET | Freq: Two times a day (BID) | ORAL | Status: DC

## 2022-11-29 MED ORDER — METOPROLOL SUCCINATE ER 25 MG PO TB24
12.5000 mg | ORAL_TABLET | Freq: Once | ORAL | Status: AC
  Administered 2022-11-29: 12.5 mg via ORAL
  Filled 2022-11-29: qty 1

## 2022-11-29 MED ORDER — METOPROLOL SUCCINATE ER 25 MG PO TB24: 12.5000 mg | ORAL_TABLET | Freq: Two times a day (BID) | ORAL | Status: DC

## 2022-11-29 MED ORDER — LISINOPRIL 2.5 MG PO TABS: 2.5000 mg | ORAL_TABLET | Freq: Every day | ORAL | 1 refills | Status: DC

## 2022-11-29 MED ORDER — METOPROLOL SUCCINATE ER 25 MG PO TB24: 25.0000 mg | ORAL_TABLET | Freq: Two times a day (BID) | ORAL | 1 refills | Status: DC

## 2022-11-29 NOTE — Progress Notes (Signed)
Physical Therapy Treatment Patient Details Name: Frances Kent MRN: [id number] DOB: May 05, 1930 Today's Date: 11/29/2022   History of Present Illness 89 year old female referred for evaluation of A-Fib and CHF. PMHx of chronic a-fib, pacemaker, HTN, HLD, CVA, and hypothyroidism. She has had a several week history of shortness of breath, productive cough treated as outpatient for bronchitis.  She presents to [facility] emergency room with chief complaint of generalized weakness and shortness of breath.    PT Comments  Pt was pleasant and motivated to participate during the session and put forth good effort throughout. She is supervision for bed mobility, and CGA for STS transfers from lowered bed surface. Once standing, she is able to amb ~40 feet with RW and CGA, light cues for positioning of the RW, taking steady steps with no significant SOB noted, no imbalance noted. SpO2 monitored throughout session with readings remained mid 90's on RA. Pt will benefit from continued PT services upon discharge to safely address deficits listed in patient problem list for decreased caregiver assistance and eventual return to PLOF.      If plan is discharge home, recommend the following: Assist for transportation;Assistance with cooking/housework   Can travel by private vehicle        Equipment Recommendations  None recommended by PT    Recommendations for Other Services       Precautions / Restrictions Precautions Precautions: Fall Restrictions Weight Bearing Restrictions: No     Mobility  Bed Mobility Overal bed mobility: Needs Assistance Bed Mobility: Supine to Sit     Supine to sit: Supervision, HOB elevated     General bed mobility comments: VC's for hand placement    Transfers Overall transfer level: Needs assistance Equipment used: Rolling walker (2 wheels) Transfers: Sit to/from Stand Sit to Stand: Contact guard assist                Ambulation/Gait Ambulation/Gait  assistance: Contact guard assist Gait Distance (Feet): 40 Feet Assistive device: Rolling walker (2 wheels) Gait Pattern/deviations: Step-through pattern Gait velocity: decreased     General Gait Details: Short bout, pt able to complete with steady steps and no significant shortness of breath, pt reports feeling generally tired at baseline, not exacerbated with amb. Light cues for position with RW   Stairs             Wheelchair Mobility     Tilt Bed    Modified Rankin (Stroke Patients Only)       Balance Overall balance assessment: No apparent balance deficits (not formally assessed)                                          Cognition Arousal: Alert Behavior During Therapy: WFL for tasks assessed/performed Overall Cognitive Status: Within Functional Limits for tasks assessed                                          Exercises      General Comments General comments (skin integrity, edema, etc.): Pt on RA for duration of session, SpO2 remained in mid 90's throughout session      Pertinent Vitals/Pain Pain Assessment Pain Assessment: No/denies pain    Home Living  Prior Function            PT Goals (current goals can now be found in the care plan section) Progress towards PT goals: Progressing toward goals    Frequency    Min 1X/week      PT Plan      Co-evaluation              AM-PAC PT "6 Clicks" Mobility   Outcome Measure  Help needed turning from your back to your side while in a flat bed without using bedrails?: None Help needed moving from lying on your back to sitting on the side of a flat bed without using bedrails?: A Little Help needed moving to and from a bed to a chair (including a wheelchair)?: A Little Help needed standing up from a chair using your arms (e.g., wheelchair or bedside chair)?: A Little Help needed to walk in hospital room?: A Little Help  needed climbing 3-5 steps with a railing? : A Little 6 Click Score: 19    End of Session Equipment Utilized During Treatment: Gait belt Activity Tolerance: Patient tolerated treatment well Patient left: in chair;with call bell/phone within reach;with chair alarm set;with family/visitor present Nurse Communication: Mobility status PT Visit Diagnosis: Unsteadiness on feet (R26.81);Muscle weakness (generalized) (M62.81)     Time: 9562-1308 PT Time Calculation (min) (ACUTE ONLY): 18 min  Charges:                            Cecile Sheerer, SPT 11/29/22, 1:11 PM

## 2022-11-29 NOTE — TOC Transition Note (Addendum)
Transition of Care [facility]) - CM/SW Discharge Note   Patient Details  Name: Frances Kent MRN: [id number] Date of Birth: [birth year]  [facility]) CM/SW Contact:  Truddie Hidden, RN Phone Number: 11/29/2022, 11:30 AM   Clinical Narrative:    Spoke with patient's daughter regarding discharge home today. She was advised referral for [facility] was accepted by [facility] and they would be in contact with her to schedule an appointment.   [place] from [facility] notified of discharge.   TOC signing off.           Patient Goals and CMS Choice      Discharge Placement                         Discharge Plan and Services Additional resources added to the After Visit Summary for                                       Social Determinants of Health (SDOH) Interventions SDOH Screenings   Food Insecurity: No Food Insecurity (11/27/2022)  Housing: Low Risk  (11/27/2022)  Transportation Needs: No Transportation Needs (11/27/2022)  Utilities: Not At Risk (11/27/2022)  Depression (PHQ2-9): Low Risk  (11/22/2022)  Financial Resource Strain: Low Risk  (06/11/2021)  Social Connections: Unknown (06/11/2021)  Stress: No Stress Concern Present (06/11/2021)  Tobacco Use: Low Risk  (11/28/2022)     Readmission Risk Interventions    02/16/2021    3:33 PM 01/14/2021   10:39 AM 11/01/2020   10:42 AM  Readmission Risk Prevention Plan  Transportation Screening Complete Complete Complete  PCP or Specialist Appt within 5-7 Days   Complete  Medication Review (RN CM)   Complete  Medication Review Oceanographer) Complete Complete   PCP or Specialist appointment within 3-5 days of discharge  Complete   HRI or Home Care Consult Complete Complete   SW Recovery Care/Counseling Consult Complete Complete   Palliative Care Screening Not Applicable Not Applicable   Skilled Nursing Facility  Not Applicable

## 2022-11-29 NOTE — Consult Note (Signed)
[facility] Customer service manager [facility]) Accountable Care Organization (ACO) [facility] Liaison Note  11/29/2022  [name] November 06, 1930 [id number]  Location: [facility] RN Hospital Liaison met patient at bedside at [facility].  Insurance: [employer] HMO   Frances Kent is a 89 y.o. female who is a Primary Care Patient of Darrick Huntsman, Mar Daring, MD [facility]. The patient was screened for readmission hospitalization with noted medium risk score for unplanned readmission risk with 1 IP in 6 months.  The patient was assessed for potential [facility]) Care Management service needs for post hospital transition for care coordination. Review of patient's electronic medical record reveals patient was admitted with Congestive Heart Failure. Liaison attempted a visit at bedside however pt discharged.  Liaison spoke with the daughter Rinaldo Cloud concerning community care management services and offered a post hospital prevention readmission follow up call from a nurse care coordinator (receptive).  Plan: [facility] Liaison will continue to follow progress and disposition to asess for post hospital community care coordination/management needs.  Referral request for community care coordination: Will make a referral for a nurse care coordinator to follow up for care management services.    [facility] Care Management/Population Health does not replace or interfere with any arrangements made by the Inpatient Transition of Care team.   For questions contact:   Elliot Cousin, RN, [facility] Liaison [place]   Population Health Office Hours MTWF  8:00 am-6:00 pm [phone] mobile [phone] [Office toll free line] Office Hours are M-F 8:30 - 5 pm [email or web address]

## 2022-11-29 NOTE — Care Management Important Message (Signed)
Important Message  Patient Details  Name: MIETTE MOLENDA MRN: [id number] Date of Birth: 06-03-1930   Important Message Given:  N/A - LOS <3 / Initial given by admissions     Olegario Messier A Kamilya Wakeman 11/29/2022, 10:40 AM

## 2022-11-29 NOTE — TOC Benefit Eligibility Note (Signed)
Patient Product/process development scientist completed.    The patient is insured through [employer]. Patient has Medicare and is not eligible for a copay card, but may be able to apply for patient assistance, if available.    Ran test claim for Farxiga 10 mg and the current 30 day co-pay is $136.90 due to being in Coverage Gap (donut hole)  Ran test claim for Jardiance 10 mg and the current 30 day co-pay is $143.69 due to being in Coverage Gap (donut hole).   This test claim was processed through [facility]- copay amounts may vary at other pharmacies due to pharmacy/plan contracts, or as the patient moves through the different stages of their insurance plan.     Roland Earl, CPHT Pharmacy Technician III Certified Patient Advocate [facility] Patient Advocate Team Direct Number: [phone]  Fax: [phone]

## 2022-11-29 NOTE — Plan of Care (Signed)

## 2022-11-29 NOTE — Plan of Care (Signed)
PMT consulted for GOC. Notes reviewed. Patient is discharging home. Would recommend outpatient palliative for continued goals of care.  PMT will sign off at this time.

## 2022-11-29 NOTE — Discharge Summary (Signed)
Physician Discharge Summary   Patient: Frances Kent MRN: [id number] DOB: 03/27/1930  Admit date:     11/26/2022  Discharge date: 11/29/22  Discharge Physician: Jonah Blue   PCP: Sherlene Shams, MD   Recommendations at discharge:   Stop taking Diltiazem Change Lasix (furosemide) to once daily Add lisinopril once daily and Toprol XL twice daily Follow up with Dr. Darrick Huntsman in 1-2 weeks; will need repeat BMP Follow up with cardiology in 1 week Home health has been ordered and should contact you to arrange  Discharge Diagnoses: Principal Problem:   Acute on chronic systolic CHF (congestive heart failure) (HCC) Active Problems:   Atrial fibrillation status post cardioversion [facility])   Hypothyroidism due to acquired atrophy of thyroid   Essential hypertension   Chronic kidney disease, stage 3b (HCC)   Dysphagia   Pacemaker   Generalized weakness    Hospital Course: 89yo with h/o PAF on Xarelto, pacemaker placement, HTN, chronic systolic CHF, HLD, CVA, and hypothyroidism who presented on 10/12 with generalized weakness and SOB.  CXR with prominent B interstitial opacities and B lung base opacities concerning for infection.  Given ceftriaxone and azithromycin, doxycycline.  Negative procalcitonin, antibiotics stopped.  HR uncontrolled in 100-120s, likely for several weeks; suspect this + known systolic CHF has created chronic pulmonary edema as the source of her respiratory issues.  Cardiology consulted.    Assessment and Plan:  Acute on chronic systolic CHF -Patient with known h/o chronic systolic CHF presenting with worsening SOB and marginal hypoxia (documented to 90%) -CXR consistent with mild pulmonary edema -Mildly elevated BNP when compared with known baseline -With elevated BNP and abnl CXR, acute decompensated CHF seems probable as diagnosis -Admitted to cardiac tele, as per the Emergency HF Mortality Risk Grade.  The patient has:  pulmonary edema requiring new O2  therapy -Echocardiogram with EF 40-45% in 2022 and currently, moderate MR -Changed from diltiazem to metoprolol given reduced EF -Also on low-dose amiodarone -CHF order set utilized -Cardiology consulted (Dr. Darrold Junker) -Was given Lasix 20 mg x 1 in ER and will repeat with 40 mg IV BID, transition to PO tomorrow per cardiology -Respiratory issues appear to be more cardiac in nature at this time, given several antibiotics without relief and negative WBC count and procalcitonin -Successfully weaned off O2   Atrial Fibrillation  -Patient presenting with known afib; has likely been in mild RVR at least periodically since at least 10/3 visit -Remains with HR in 110s currently, which is likely increasing the demand on her heart and exacerbating her CHF (above) -Given the depressed EF seen previously will attempt to avoid Cardizem and will start low-dose metoprolol; amiodarone also resumed (QTc reviewed, 460) -Cardiology consulted -HS troponin negative x 2 -Continue home Xarelto   Dysphagia -Patient has a h/o esophageal and oropharyngeal dysphagia in the past -She is noticing difficulty swallowing pills and foods, is on soft diet without meats at home -Aspiration is a consideration but would be more likely aspiration pneumonitis than infection if present -Speech therapy consulted, patient is at baseline and has very effective strategies for dealing with this issue -No further evaluation/treatment needed   HTN -On Diltiazem PTA, but this was stopped -Now on low-dose metoprolol -Cardiology is also starting low-dose lisinopril   HLD -Statin intolerant   Hypothyroidism -Continue Synthroid   Mood d/o -Continue mirtazapine   Stage 3b CKD -Appears to be stable at this time -Attempt to avoid nephrotoxic medications -Closely follow renal function with addition of lisinopril  Physician Discharge Summary   Patient: Frances Kent MRN: [id number] DOB: 03/27/1930  Admit date:     11/26/2022  Discharge date: 11/29/22  Discharge Physician: Jonah Blue   PCP: Sherlene Shams, MD   Recommendations at discharge:   Stop taking Diltiazem Change Lasix (furosemide) to once daily Add lisinopril once daily and Toprol XL twice daily Follow up with Dr. Darrick Huntsman in 1-2 weeks; will need repeat BMP Follow up with cardiology in 1 week Home health has been ordered and should contact you to arrange  Discharge Diagnoses: Principal Problem:   Acute on chronic systolic CHF (congestive heart failure) (HCC) Active Problems:   Atrial fibrillation status post cardioversion [facility])   Hypothyroidism due to acquired atrophy of thyroid   Essential hypertension   Chronic kidney disease, stage 3b (HCC)   Dysphagia   Pacemaker   Generalized weakness    Hospital Course: 89yo with h/o PAF on Xarelto, pacemaker placement, HTN, chronic systolic CHF, HLD, CVA, and hypothyroidism who presented on 10/12 with generalized weakness and SOB.  CXR with prominent B interstitial opacities and B lung base opacities concerning for infection.  Given ceftriaxone and azithromycin, doxycycline.  Negative procalcitonin, antibiotics stopped.  HR uncontrolled in 100-120s, likely for several weeks; suspect this + known systolic CHF has created chronic pulmonary edema as the source of her respiratory issues.  Cardiology consulted.    Assessment and Plan:  Acute on chronic systolic CHF -Patient with known h/o chronic systolic CHF presenting with worsening SOB and marginal hypoxia (documented to 90%) -CXR consistent with mild pulmonary edema -Mildly elevated BNP when compared with known baseline -With elevated BNP and abnl CXR, acute decompensated CHF seems probable as diagnosis -Admitted to cardiac tele, as per the Emergency HF Mortality Risk Grade.  The patient has:  pulmonary edema requiring new O2  therapy -Echocardiogram with EF 40-45% in 2022 and currently, moderate MR -Changed from diltiazem to metoprolol given reduced EF -Also on low-dose amiodarone -CHF order set utilized -Cardiology consulted (Dr. Darrold Junker) -Was given Lasix 20 mg x 1 in ER and will repeat with 40 mg IV BID, transition to PO tomorrow per cardiology -Respiratory issues appear to be more cardiac in nature at this time, given several antibiotics without relief and negative WBC count and procalcitonin -Successfully weaned off O2   Atrial Fibrillation  -Patient presenting with known afib; has likely been in mild RVR at least periodically since at least 10/3 visit -Remains with HR in 110s currently, which is likely increasing the demand on her heart and exacerbating her CHF (above) -Given the depressed EF seen previously will attempt to avoid Cardizem and will start low-dose metoprolol; amiodarone also resumed (QTc reviewed, 460) -Cardiology consulted -HS troponin negative x 2 -Continue home Xarelto   Dysphagia -Patient has a h/o esophageal and oropharyngeal dysphagia in the past -She is noticing difficulty swallowing pills and foods, is on soft diet without meats at home -Aspiration is a consideration but would be more likely aspiration pneumonitis than infection if present -Speech therapy consulted, patient is at baseline and has very effective strategies for dealing with this issue -No further evaluation/treatment needed   HTN -On Diltiazem PTA, but this was stopped -Now on low-dose metoprolol -Cardiology is also starting low-dose lisinopril   HLD -Statin intolerant   Hypothyroidism -Continue Synthroid   Mood d/o -Continue mirtazapine   Stage 3b CKD -Appears to be stable at this time -Attempt to avoid nephrotoxic medications -Closely follow renal function with addition of lisinopril  Physician Discharge Summary   Patient: Frances Kent MRN: [id number] DOB: 03/27/1930  Admit date:     11/26/2022  Discharge date: 11/29/22  Discharge Physician: Jonah Blue   PCP: Sherlene Shams, MD   Recommendations at discharge:   Stop taking Diltiazem Change Lasix (furosemide) to once daily Add lisinopril once daily and Toprol XL twice daily Follow up with Dr. Darrick Huntsman in 1-2 weeks; will need repeat BMP Follow up with cardiology in 1 week Home health has been ordered and should contact you to arrange  Discharge Diagnoses: Principal Problem:   Acute on chronic systolic CHF (congestive heart failure) (HCC) Active Problems:   Atrial fibrillation status post cardioversion [facility])   Hypothyroidism due to acquired atrophy of thyroid   Essential hypertension   Chronic kidney disease, stage 3b (HCC)   Dysphagia   Pacemaker   Generalized weakness    Hospital Course: 89yo with h/o PAF on Xarelto, pacemaker placement, HTN, chronic systolic CHF, HLD, CVA, and hypothyroidism who presented on 10/12 with generalized weakness and SOB.  CXR with prominent B interstitial opacities and B lung base opacities concerning for infection.  Given ceftriaxone and azithromycin, doxycycline.  Negative procalcitonin, antibiotics stopped.  HR uncontrolled in 100-120s, likely for several weeks; suspect this + known systolic CHF has created chronic pulmonary edema as the source of her respiratory issues.  Cardiology consulted.    Assessment and Plan:  Acute on chronic systolic CHF -Patient with known h/o chronic systolic CHF presenting with worsening SOB and marginal hypoxia (documented to 90%) -CXR consistent with mild pulmonary edema -Mildly elevated BNP when compared with known baseline -With elevated BNP and abnl CXR, acute decompensated CHF seems probable as diagnosis -Admitted to cardiac tele, as per the Emergency HF Mortality Risk Grade.  The patient has:  pulmonary edema requiring new O2  therapy -Echocardiogram with EF 40-45% in 2022 and currently, moderate MR -Changed from diltiazem to metoprolol given reduced EF -Also on low-dose amiodarone -CHF order set utilized -Cardiology consulted (Dr. Darrold Junker) -Was given Lasix 20 mg x 1 in ER and will repeat with 40 mg IV BID, transition to PO tomorrow per cardiology -Respiratory issues appear to be more cardiac in nature at this time, given several antibiotics without relief and negative WBC count and procalcitonin -Successfully weaned off O2   Atrial Fibrillation  -Patient presenting with known afib; has likely been in mild RVR at least periodically since at least 10/3 visit -Remains with HR in 110s currently, which is likely increasing the demand on her heart and exacerbating her CHF (above) -Given the depressed EF seen previously will attempt to avoid Cardizem and will start low-dose metoprolol; amiodarone also resumed (QTc reviewed, 460) -Cardiology consulted -HS troponin negative x 2 -Continue home Xarelto   Dysphagia -Patient has a h/o esophageal and oropharyngeal dysphagia in the past -She is noticing difficulty swallowing pills and foods, is on soft diet without meats at home -Aspiration is a consideration but would be more likely aspiration pneumonitis than infection if present -Speech therapy consulted, patient is at baseline and has very effective strategies for dealing with this issue -No further evaluation/treatment needed   HTN -On Diltiazem PTA, but this was stopped -Now on low-dose metoprolol -Cardiology is also starting low-dose lisinopril   HLD -Statin intolerant   Hypothyroidism -Continue Synthroid   Mood d/o -Continue mirtazapine   Stage 3b CKD -Appears to be stable at this time -Attempt to avoid nephrotoxic medications -Closely follow renal function with addition of lisinopril  Physician Discharge Summary   Patient: Frances Kent MRN: [id number] DOB: 03/27/1930  Admit date:     11/26/2022  Discharge date: 11/29/22  Discharge Physician: Jonah Blue   PCP: Sherlene Shams, MD   Recommendations at discharge:   Stop taking Diltiazem Change Lasix (furosemide) to once daily Add lisinopril once daily and Toprol XL twice daily Follow up with Dr. Darrick Huntsman in 1-2 weeks; will need repeat BMP Follow up with cardiology in 1 week Home health has been ordered and should contact you to arrange  Discharge Diagnoses: Principal Problem:   Acute on chronic systolic CHF (congestive heart failure) (HCC) Active Problems:   Atrial fibrillation status post cardioversion [facility])   Hypothyroidism due to acquired atrophy of thyroid   Essential hypertension   Chronic kidney disease, stage 3b (HCC)   Dysphagia   Pacemaker   Generalized weakness    Hospital Course: 89yo with h/o PAF on Xarelto, pacemaker placement, HTN, chronic systolic CHF, HLD, CVA, and hypothyroidism who presented on 10/12 with generalized weakness and SOB.  CXR with prominent B interstitial opacities and B lung base opacities concerning for infection.  Given ceftriaxone and azithromycin, doxycycline.  Negative procalcitonin, antibiotics stopped.  HR uncontrolled in 100-120s, likely for several weeks; suspect this + known systolic CHF has created chronic pulmonary edema as the source of her respiratory issues.  Cardiology consulted.    Assessment and Plan:  Acute on chronic systolic CHF -Patient with known h/o chronic systolic CHF presenting with worsening SOB and marginal hypoxia (documented to 90%) -CXR consistent with mild pulmonary edema -Mildly elevated BNP when compared with known baseline -With elevated BNP and abnl CXR, acute decompensated CHF seems probable as diagnosis -Admitted to cardiac tele, as per the Emergency HF Mortality Risk Grade.  The patient has:  pulmonary edema requiring new O2  therapy -Echocardiogram with EF 40-45% in 2022 and currently, moderate MR -Changed from diltiazem to metoprolol given reduced EF -Also on low-dose amiodarone -CHF order set utilized -Cardiology consulted (Dr. Darrold Junker) -Was given Lasix 20 mg x 1 in ER and will repeat with 40 mg IV BID, transition to PO tomorrow per cardiology -Respiratory issues appear to be more cardiac in nature at this time, given several antibiotics without relief and negative WBC count and procalcitonin -Successfully weaned off O2   Atrial Fibrillation  -Patient presenting with known afib; has likely been in mild RVR at least periodically since at least 10/3 visit -Remains with HR in 110s currently, which is likely increasing the demand on her heart and exacerbating her CHF (above) -Given the depressed EF seen previously will attempt to avoid Cardizem and will start low-dose metoprolol; amiodarone also resumed (QTc reviewed, 460) -Cardiology consulted -HS troponin negative x 2 -Continue home Xarelto   Dysphagia -Patient has a h/o esophageal and oropharyngeal dysphagia in the past -She is noticing difficulty swallowing pills and foods, is on soft diet without meats at home -Aspiration is a consideration but would be more likely aspiration pneumonitis than infection if present -Speech therapy consulted, patient is at baseline and has very effective strategies for dealing with this issue -No further evaluation/treatment needed   HTN -On Diltiazem PTA, but this was stopped -Now on low-dose metoprolol -Cardiology is also starting low-dose lisinopril   HLD -Statin intolerant   Hypothyroidism -Continue Synthroid   Mood d/o -Continue mirtazapine   Stage 3b CKD -Appears to be stable at this time -Attempt to avoid nephrotoxic medications -Closely follow renal function with addition of lisinopril  Paraschos, Alexander, MD. Go in 1 week(s).   Specialty: Cardiology Contact information: [address] [facility] [place] [address] [phone]                Discharge Exam:   Subjective: Still feels tired, off O2, patient/family feel ready for dc   Objective: Vitals:   11/29/22 0756 11/29/22 1114  BP: 127/80 101/66  Pulse: (!) 113 86  Resp: 18 19  Temp: 98.5 F (36.9 C) 97.9 F (36.6 C)  SpO2: 94% 93%    Intake/Output Summary (Last 24 hours) at 11/29/2022 1121 Last data filed at 11/29/2022 1043 Gross per 24 hour  Intake 480 ml  Output 100 ml  Net 380 ml   Filed Weights   11/26/22 1407 11/28/22 1100 11/29/22 0500  Weight: 54.9 kg 55.3 kg 54.4 kg    Exam:  General:  Appears frail but improving, on RA Eyes:   EOMI, normal  lids, iris ENT:  grossly normal hearing, lips & tongue, mmm Neck:  no LAD, masses or thyromegaly Cardiovascular:  Irregularly irregular without tachycardia. No LE edema.  Respiratory:   CTAB.  Normal respiratory effort. Abdomen:  soft, NT, ND Skin:  no rash or induration seen on limited exam Musculoskeletal:  grossly normal tone BUE/BLE, good ROM, no bony abnormality Psychiatric:  blunted mood and affect, speech fluent and appropriate, AOx3 Neurologic:  CN 2-12 grossly intact, moves all extremities in coordinated fashion  Data Reviewed: I have reviewed the patient's lab results since admission.  Pertinent labs for today include:  K+ 3.3 BUN 17/Creatinine 1.09/GFR 48 WBC 6.6 Hgb 11.4    Condition at discharge: improving  The results of significant diagnostics from this hospitalization (including imaging, microbiology, ancillary and laboratory) are listed below for reference.   Imaging Studies: ECHOCARDIOGRAM COMPLETE  Result Date: 11/28/2022    ECHOCARDIOGRAM REPORT   Patient Name:   Frances Kent Date of Exam: 11/28/2022 Medical Rec #:  [id number]        Height:       63.0 in Accession #:    [id number]       Weight:       121.0 lb Date of Birth:  Apr 01, 1930        BSA:          1.562 m Patient Age:    89 years         BP:           113/75 mmHg Patient Gender: F                HR:           97 bpm. Exam Location:  [facility] Procedure: 2D Echo, Cardiac Doppler and Color Doppler Indications:     CHF  History:         Patient has prior history of Echocardiogram examinations, most                  recent 11/01/2020. CHF, Pacemaker, Stroke, Arrythmias:Atrial                  Fibrillation and Atrial Flutter, Signs/Symptoms:Dyspnea and                  Fatigue; Risk Factors:Hypertension and Dyslipidemia. Pulmonary                  embolus.  Sonographer:     Mikki Harbor Referring Phys:  [id number] Diagnosing Phys: Lorine Bears MD IMPRESSIONS  Physician Discharge Summary   Patient: Frances Kent MRN: [id number] DOB: 03/27/1930  Admit date:     11/26/2022  Discharge date: 11/29/22  Discharge Physician: Jonah Blue   PCP: Sherlene Shams, MD   Recommendations at discharge:   Stop taking Diltiazem Change Lasix (furosemide) to once daily Add lisinopril once daily and Toprol XL twice daily Follow up with Dr. Darrick Huntsman in 1-2 weeks; will need repeat BMP Follow up with cardiology in 1 week Home health has been ordered and should contact you to arrange  Discharge Diagnoses: Principal Problem:   Acute on chronic systolic CHF (congestive heart failure) (HCC) Active Problems:   Atrial fibrillation status post cardioversion [facility])   Hypothyroidism due to acquired atrophy of thyroid   Essential hypertension   Chronic kidney disease, stage 3b (HCC)   Dysphagia   Pacemaker   Generalized weakness    Hospital Course: 89yo with h/o PAF on Xarelto, pacemaker placement, HTN, chronic systolic CHF, HLD, CVA, and hypothyroidism who presented on 10/12 with generalized weakness and SOB.  CXR with prominent B interstitial opacities and B lung base opacities concerning for infection.  Given ceftriaxone and azithromycin, doxycycline.  Negative procalcitonin, antibiotics stopped.  HR uncontrolled in 100-120s, likely for several weeks; suspect this + known systolic CHF has created chronic pulmonary edema as the source of her respiratory issues.  Cardiology consulted.    Assessment and Plan:  Acute on chronic systolic CHF -Patient with known h/o chronic systolic CHF presenting with worsening SOB and marginal hypoxia (documented to 90%) -CXR consistent with mild pulmonary edema -Mildly elevated BNP when compared with known baseline -With elevated BNP and abnl CXR, acute decompensated CHF seems probable as diagnosis -Admitted to cardiac tele, as per the Emergency HF Mortality Risk Grade.  The patient has:  pulmonary edema requiring new O2  therapy -Echocardiogram with EF 40-45% in 2022 and currently, moderate MR -Changed from diltiazem to metoprolol given reduced EF -Also on low-dose amiodarone -CHF order set utilized -Cardiology consulted (Dr. Darrold Junker) -Was given Lasix 20 mg x 1 in ER and will repeat with 40 mg IV BID, transition to PO tomorrow per cardiology -Respiratory issues appear to be more cardiac in nature at this time, given several antibiotics without relief and negative WBC count and procalcitonin -Successfully weaned off O2   Atrial Fibrillation  -Patient presenting with known afib; has likely been in mild RVR at least periodically since at least 10/3 visit -Remains with HR in 110s currently, which is likely increasing the demand on her heart and exacerbating her CHF (above) -Given the depressed EF seen previously will attempt to avoid Cardizem and will start low-dose metoprolol; amiodarone also resumed (QTc reviewed, 460) -Cardiology consulted -HS troponin negative x 2 -Continue home Xarelto   Dysphagia -Patient has a h/o esophageal and oropharyngeal dysphagia in the past -She is noticing difficulty swallowing pills and foods, is on soft diet without meats at home -Aspiration is a consideration but would be more likely aspiration pneumonitis than infection if present -Speech therapy consulted, patient is at baseline and has very effective strategies for dealing with this issue -No further evaluation/treatment needed   HTN -On Diltiazem PTA, but this was stopped -Now on low-dose metoprolol -Cardiology is also starting low-dose lisinopril   HLD -Statin intolerant   Hypothyroidism -Continue Synthroid   Mood d/o -Continue mirtazapine   Stage 3b CKD -Appears to be stable at this time -Attempt to avoid nephrotoxic medications -Closely follow renal function with addition of lisinopril  Paraschos, Alexander, MD. Go in 1 week(s).   Specialty: Cardiology Contact information: [address] [facility] [place] [address] [phone]                Discharge Exam:   Subjective: Still feels tired, off O2, patient/family feel ready for dc   Objective: Vitals:   11/29/22 0756 11/29/22 1114  BP: 127/80 101/66  Pulse: (!) 113 86  Resp: 18 19  Temp: 98.5 F (36.9 C) 97.9 F (36.6 C)  SpO2: 94% 93%    Intake/Output Summary (Last 24 hours) at 11/29/2022 1121 Last data filed at 11/29/2022 1043 Gross per 24 hour  Intake 480 ml  Output 100 ml  Net 380 ml   Filed Weights   11/26/22 1407 11/28/22 1100 11/29/22 0500  Weight: 54.9 kg 55.3 kg 54.4 kg    Exam:  General:  Appears frail but improving, on RA Eyes:   EOMI, normal  lids, iris ENT:  grossly normal hearing, lips & tongue, mmm Neck:  no LAD, masses or thyromegaly Cardiovascular:  Irregularly irregular without tachycardia. No LE edema.  Respiratory:   CTAB.  Normal respiratory effort. Abdomen:  soft, NT, ND Skin:  no rash or induration seen on limited exam Musculoskeletal:  grossly normal tone BUE/BLE, good ROM, no bony abnormality Psychiatric:  blunted mood and affect, speech fluent and appropriate, AOx3 Neurologic:  CN 2-12 grossly intact, moves all extremities in coordinated fashion  Data Reviewed: I have reviewed the patient's lab results since admission.  Pertinent labs for today include:  K+ 3.3 BUN 17/Creatinine 1.09/GFR 48 WBC 6.6 Hgb 11.4    Condition at discharge: improving  The results of significant diagnostics from this hospitalization (including imaging, microbiology, ancillary and laboratory) are listed below for reference.   Imaging Studies: ECHOCARDIOGRAM COMPLETE  Result Date: 11/28/2022    ECHOCARDIOGRAM REPORT   Patient Name:   Frances Kent Date of Exam: 11/28/2022 Medical Rec #:  [id number]        Height:       63.0 in Accession #:    [id number]       Weight:       121.0 lb Date of Birth:  Apr 01, 1930        BSA:          1.562 m Patient Age:    89 years         BP:           113/75 mmHg Patient Gender: F                HR:           97 bpm. Exam Location:  [facility] Procedure: 2D Echo, Cardiac Doppler and Color Doppler Indications:     CHF  History:         Patient has prior history of Echocardiogram examinations, most                  recent 11/01/2020. CHF, Pacemaker, Stroke, Arrythmias:Atrial                  Fibrillation and Atrial Flutter, Signs/Symptoms:Dyspnea and                  Fatigue; Risk Factors:Hypertension and Dyslipidemia. Pulmonary                  embolus.  Sonographer:     Mikki Harbor Referring Phys:  [id number] Diagnosing Phys: Lorine Bears MD IMPRESSIONS

## 2022-11-29 NOTE — Progress Notes (Signed)
TROPONINI <0.03 02/25/2018    Lab Results   Component Value Date   CHOL 257 (H) 11/23/2021   CHOL 144 11/02/2020   CHOL 284 (H) 12/11/2019   Lab Results  Component Value Date   HDL 96.40 11/23/2021   HDL 48 11/02/2020   HDL 100.10 12/11/2019   Lab Results  Component Value Date   LDLCALC 140 (H) 11/23/2021   LDLCALC 81 11/02/2020   LDLCALC 166 (H) 12/11/2019   Lab Results  Component Value Date   TRIG 104.0 11/23/2021   TRIG 76 11/02/2020   TRIG 72 11/02/2020   Lab Results  Component Value Date   CHOLHDL 3 11/23/2021   CHOLHDL 3.0 11/02/2020   CHOLHDL 3 12/11/2019   Lab Results  Component Value Date   LDLDIRECT 185.3 03/27/2013   LDLDIRECT 156.1 03/23/2012   LDLDIRECT 181.4 01/17/2011      Radiology: ECHOCARDIOGRAM COMPLETE  Result Date: 11/28/2022    ECHOCARDIOGRAM REPORT   Patient Name:   Frances Kent Date of Exam: 11/28/2022 Medical Rec #:  [id number]        Height:       63.0 in Accession #:    [id number]       Weight:       121.0 lb Date of Birth:  November 19, 1930        BSA:          1.562 m Patient Age:    89 years         BP:           113/75 mmHg Patient Gender: F                HR:           97 bpm. Exam Location:  [facility] Procedure: 2D Echo, Cardiac Doppler and Color Doppler Indications:     CHF  History:         Patient has prior history of Echocardiogram examinations, most                  recent 11/01/2020. CHF, Pacemaker, Stroke, Arrythmias:Atrial                  Fibrillation and Atrial Flutter, Signs/Symptoms:Dyspnea and                  Fatigue; Risk Factors:Hypertension and Dyslipidemia. Pulmonary                  embolus.  Sonographer:     Mikki Harbor Referring Phys:  [id number] JENNIFER YATES Diagnosing Phys: Lorine Bears MD IMPRESSIONS  1. Left ventricular ejection fraction, by estimation, is 40 to 45%. The left ventricle has mildly decreased function. The left ventricle has no regional wall motion abnormalities. There is mild left ventricular hypertrophy. Left ventricular diastolic parameters are  indeterminate.  2. Right ventricular systolic function is normal. The right ventricular size is normal. There is normal pulmonary artery systolic pressure.  3. Left atrial size was severely dilated.  4. Right atrial size was moderately dilated.  5. The mitral valve is normal in structure. Moderate mitral valve regurgitation. Mild mitral stenosis. The mean mitral valve gradient is 4.0 mmHg. Moderate mitral annular calcification.  6. The aortic valve is normal in structure. Aortic valve regurgitation is not visualized. Aortic valve sclerosis/calcification is present, without any evidence of aortic stenosis.  7. The inferior vena cava is normal in size with greater than 50% respiratory variability, suggesting right atrial pressure of 3 mmHg.  [facility] CARDIOLOGY CARDIOLOGY CONSULT NOTE  Patient ID: NESA DISTEL MRN: [id number] DOB/AGE: 06-02-1930 89 y.o.  Admit date: 11/26/2022 Referring Physician Allena Katz Primary Physician [facility] Primary Cardiologist Paraschos Reason for Consultation atrial fibrillation  HPI: 89 year old female referred for evaluation of atrial fibrillation and possible congestive heart failure.  The patient has a history of chronic atrial fibrillation, on Xarelto for stroke prevention.  She is status post Micra AV leadless pacemaker for sick sinus syndrome.  She has had a several week history of shortness of breath, productive cough treated as outpatient for bronchitis.  She presents to [name] emergency room with chief complaint of generalized weakness and shortness of breath.  The emergency room patient noted to be in atrial fibrillation at a rate of 108 bpm.  Chest x-ray revealed lateral interstitial opacities consistent with pneumonia versus congestive heart failure.  Admission labs noted for mildly elevated BNP 460.7.  2D echocardiogram 11/06/2019 revealed LVEF of 40%.  72-hour Holter monitor 07/20/2022 revealed atrial fibrillation with mean heart rate of 74 bpm with heart rate range 49 to 127 bpm.  The patient is most recently been on Cardizem CD 120 mg for rate control.  Interval history: -Patient states she feels "so-so" today. Met with palliative yesterday to discuss GOC.  -SOB improved, weaned to room air. Reports good UOP with IV diuresis. -HR borderline elevated today. Denies any palpitations, chest pain.   Review of systems complete and found to be negative unless listed above     Past Medical History:  Diagnosis Date   Atrial fibrillation (HCC)    Benign breast cyst in female, left 10/07/2016   Candida esophagitis (HCC) 05/03/2021   Found on Mar 10 2021 EGD    CAP (community acquired pneumonia) 02/15/2021   Colon adenomas    GERD (gastroesophageal reflux disease)    HCAP (healthcare-associated  pneumonia) 02/02/2021   Hypertension    Hypothyroidism    Macular degeneration    Mitral regurgitation    Pulmonary embolism (HCC) 02/2016   Severe sepsis (HCC) 02/02/2021   Stroke (HCC) 10/2020   SVT (supraventricular tachycardia) (HCC)     Past Surgical History:  Procedure Laterality Date   APPENDECTOMY  1960   BREAST CYST ASPIRATION Left 2017   CARDIOVERSION N/A 04/05/2016   Procedure: Cardioversion;  Surgeon: Lamar Blinks, MD;  Location: [facility] ORS;  Service: Cardiovascular;  Laterality: N/A;   CHOLECYSTECTOMY  1985   ESOPHAGOGASTRODUODENOSCOPY N/A 03/10/2021   Procedure: ESOPHAGOGASTRODUODENOSCOPY (EGD);  Surgeon: Toledo, Boykin Nearing, MD;  Location: [facility] ENDOSCOPY;  Service: Gastroenterology;  Laterality: N/A;   ESOPHAGOGASTRODUODENOSCOPY (EGD) WITH PROPOFOL N/A 11/27/2019   Procedure: ESOPHAGOGASTRODUODENOSCOPY (EGD) WITH PROPOFOL;  Surgeon: Regis Bill, MD;  Location: [facility] ENDOSCOPY;  Service: Endoscopy;  Laterality: N/A;   ESOPHAGOGASTRODUODENOSCOPY (EGD) WITH PROPOFOL N/A 12/24/2019   Procedure: ESOPHAGOGASTRODUODENOSCOPY (EGD) WITH PROPOFOL;  Surgeon: Regis Bill, MD;  Location: [facility] ENDOSCOPY;  Service: Endoscopy;  Laterality: N/A;   IR CT HEAD LTD  11/01/2020   IR PERCUTANEOUS ART THROMBECTOMY/INFUSION INTRACRANIAL INC DIAG ANGIO  11/01/2020   IR US GUIDE VASC ACCESS RIGHT  11/02/2020   OVARIAN CYST REMOVAL     PACEMAKER LEADLESS INSERTION N/A 11/16/2021   Procedure: PACEMAKER LEADLESS INSERTION;  Surgeon: Marcina Millard, MD;  Location: [facility] INVASIVE CV LAB;  Service: Cardiovascular;  Laterality: N/A;   RADIOLOGY WITH ANESTHESIA N/A 11/01/2020   Procedure: RADIOLOGY WITH ANESTHESIA;  Surgeon: Julieanne Cotton, MD;  Location: [facility] OR;  Service: Radiology;  Laterality: N/A;   TEE WITHOUT CARDIOVERSION  [facility] CARDIOLOGY CARDIOLOGY CONSULT NOTE  Patient ID: NESA DISTEL MRN: [id number] DOB/AGE: 06-02-1930 89 y.o.  Admit date: 11/26/2022 Referring Physician Allena Katz Primary Physician [facility] Primary Cardiologist Paraschos Reason for Consultation atrial fibrillation  HPI: 89 year old female referred for evaluation of atrial fibrillation and possible congestive heart failure.  The patient has a history of chronic atrial fibrillation, on Xarelto for stroke prevention.  She is status post Micra AV leadless pacemaker for sick sinus syndrome.  She has had a several week history of shortness of breath, productive cough treated as outpatient for bronchitis.  She presents to [name] emergency room with chief complaint of generalized weakness and shortness of breath.  The emergency room patient noted to be in atrial fibrillation at a rate of 108 bpm.  Chest x-ray revealed lateral interstitial opacities consistent with pneumonia versus congestive heart failure.  Admission labs noted for mildly elevated BNP 460.7.  2D echocardiogram 11/06/2019 revealed LVEF of 40%.  72-hour Holter monitor 07/20/2022 revealed atrial fibrillation with mean heart rate of 74 bpm with heart rate range 49 to 127 bpm.  The patient is most recently been on Cardizem CD 120 mg for rate control.  Interval history: -Patient states she feels "so-so" today. Met with palliative yesterday to discuss GOC.  -SOB improved, weaned to room air. Reports good UOP with IV diuresis. -HR borderline elevated today. Denies any palpitations, chest pain.   Review of systems complete and found to be negative unless listed above     Past Medical History:  Diagnosis Date   Atrial fibrillation (HCC)    Benign breast cyst in female, left 10/07/2016   Candida esophagitis (HCC) 05/03/2021   Found on Mar 10 2021 EGD    CAP (community acquired pneumonia) 02/15/2021   Colon adenomas    GERD (gastroesophageal reflux disease)    HCAP (healthcare-associated  pneumonia) 02/02/2021   Hypertension    Hypothyroidism    Macular degeneration    Mitral regurgitation    Pulmonary embolism (HCC) 02/2016   Severe sepsis (HCC) 02/02/2021   Stroke (HCC) 10/2020   SVT (supraventricular tachycardia) (HCC)     Past Surgical History:  Procedure Laterality Date   APPENDECTOMY  1960   BREAST CYST ASPIRATION Left 2017   CARDIOVERSION N/A 04/05/2016   Procedure: Cardioversion;  Surgeon: Lamar Blinks, MD;  Location: [facility] ORS;  Service: Cardiovascular;  Laterality: N/A;   CHOLECYSTECTOMY  1985   ESOPHAGOGASTRODUODENOSCOPY N/A 03/10/2021   Procedure: ESOPHAGOGASTRODUODENOSCOPY (EGD);  Surgeon: Toledo, Boykin Nearing, MD;  Location: [facility] ENDOSCOPY;  Service: Gastroenterology;  Laterality: N/A;   ESOPHAGOGASTRODUODENOSCOPY (EGD) WITH PROPOFOL N/A 11/27/2019   Procedure: ESOPHAGOGASTRODUODENOSCOPY (EGD) WITH PROPOFOL;  Surgeon: Regis Bill, MD;  Location: [facility] ENDOSCOPY;  Service: Endoscopy;  Laterality: N/A;   ESOPHAGOGASTRODUODENOSCOPY (EGD) WITH PROPOFOL N/A 12/24/2019   Procedure: ESOPHAGOGASTRODUODENOSCOPY (EGD) WITH PROPOFOL;  Surgeon: Regis Bill, MD;  Location: [facility] ENDOSCOPY;  Service: Endoscopy;  Laterality: N/A;   IR CT HEAD LTD  11/01/2020   IR PERCUTANEOUS ART THROMBECTOMY/INFUSION INTRACRANIAL INC DIAG ANGIO  11/01/2020   IR US GUIDE VASC ACCESS RIGHT  11/02/2020   OVARIAN CYST REMOVAL     PACEMAKER LEADLESS INSERTION N/A 11/16/2021   Procedure: PACEMAKER LEADLESS INSERTION;  Surgeon: Marcina Millard, MD;  Location: [facility] INVASIVE CV LAB;  Service: Cardiovascular;  Laterality: N/A;   RADIOLOGY WITH ANESTHESIA N/A 11/01/2020   Procedure: RADIOLOGY WITH ANESTHESIA;  Surgeon: Julieanne Cotton, MD;  Location: [facility];  Service: Radiology;  Laterality: N/A;   TEE WITHOUT CARDIOVERSION  TROPONINI <0.03 02/25/2018    Lab Results   Component Value Date   CHOL 257 (H) 11/23/2021   CHOL 144 11/02/2020   CHOL 284 (H) 12/11/2019   Lab Results  Component Value Date   HDL 96.40 11/23/2021   HDL 48 11/02/2020   HDL 100.10 12/11/2019   Lab Results  Component Value Date   LDLCALC 140 (H) 11/23/2021   LDLCALC 81 11/02/2020   LDLCALC 166 (H) 12/11/2019   Lab Results  Component Value Date   TRIG 104.0 11/23/2021   TRIG 76 11/02/2020   TRIG 72 11/02/2020   Lab Results  Component Value Date   CHOLHDL 3 11/23/2021   CHOLHDL 3.0 11/02/2020   CHOLHDL 3 12/11/2019   Lab Results  Component Value Date   LDLDIRECT 185.3 03/27/2013   LDLDIRECT 156.1 03/23/2012   LDLDIRECT 181.4 01/17/2011      Radiology: ECHOCARDIOGRAM COMPLETE  Result Date: 11/28/2022    ECHOCARDIOGRAM REPORT   Patient Name:   Frances Kent Date of Exam: 11/28/2022 Medical Rec #:  [id number]        Height:       63.0 in Accession #:    [id number]       Weight:       121.0 lb Date of Birth:  November 19, 1930        BSA:          1.562 m Patient Age:    89 years         BP:           113/75 mmHg Patient Gender: F                HR:           97 bpm. Exam Location:  [facility] Procedure: 2D Echo, Cardiac Doppler and Color Doppler Indications:     CHF  History:         Patient has prior history of Echocardiogram examinations, most                  recent 11/01/2020. CHF, Pacemaker, Stroke, Arrythmias:Atrial                  Fibrillation and Atrial Flutter, Signs/Symptoms:Dyspnea and                  Fatigue; Risk Factors:Hypertension and Dyslipidemia. Pulmonary                  embolus.  Sonographer:     Mikki Harbor Referring Phys:  [id number] JENNIFER YATES Diagnosing Phys: Lorine Bears MD IMPRESSIONS  1. Left ventricular ejection fraction, by estimation, is 40 to 45%. The left ventricle has mildly decreased function. The left ventricle has no regional wall motion abnormalities. There is mild left ventricular hypertrophy. Left ventricular diastolic parameters are  indeterminate.  2. Right ventricular systolic function is normal. The right ventricular size is normal. There is normal pulmonary artery systolic pressure.  3. Left atrial size was severely dilated.  4. Right atrial size was moderately dilated.  5. The mitral valve is normal in structure. Moderate mitral valve regurgitation. Mild mitral stenosis. The mean mitral valve gradient is 4.0 mmHg. Moderate mitral annular calcification.  6. The aortic valve is normal in structure. Aortic valve regurgitation is not visualized. Aortic valve sclerosis/calcification is present, without any evidence of aortic stenosis.  7. The inferior vena cava is normal in size with greater than 50% respiratory variability, suggesting right atrial pressure of 3 mmHg.  TROPONINI <0.03 02/25/2018    Lab Results   Component Value Date   CHOL 257 (H) 11/23/2021   CHOL 144 11/02/2020   CHOL 284 (H) 12/11/2019   Lab Results  Component Value Date   HDL 96.40 11/23/2021   HDL 48 11/02/2020   HDL 100.10 12/11/2019   Lab Results  Component Value Date   LDLCALC 140 (H) 11/23/2021   LDLCALC 81 11/02/2020   LDLCALC 166 (H) 12/11/2019   Lab Results  Component Value Date   TRIG 104.0 11/23/2021   TRIG 76 11/02/2020   TRIG 72 11/02/2020   Lab Results  Component Value Date   CHOLHDL 3 11/23/2021   CHOLHDL 3.0 11/02/2020   CHOLHDL 3 12/11/2019   Lab Results  Component Value Date   LDLDIRECT 185.3 03/27/2013   LDLDIRECT 156.1 03/23/2012   LDLDIRECT 181.4 01/17/2011      Radiology: ECHOCARDIOGRAM COMPLETE  Result Date: 11/28/2022    ECHOCARDIOGRAM REPORT   Patient Name:   Frances Kent Date of Exam: 11/28/2022 Medical Rec #:  [id number]        Height:       63.0 in Accession #:    [id number]       Weight:       121.0 lb Date of Birth:  November 19, 1930        BSA:          1.562 m Patient Age:    89 years         BP:           113/75 mmHg Patient Gender: F                HR:           97 bpm. Exam Location:  [facility] Procedure: 2D Echo, Cardiac Doppler and Color Doppler Indications:     CHF  History:         Patient has prior history of Echocardiogram examinations, most                  recent 11/01/2020. CHF, Pacemaker, Stroke, Arrythmias:Atrial                  Fibrillation and Atrial Flutter, Signs/Symptoms:Dyspnea and                  Fatigue; Risk Factors:Hypertension and Dyslipidemia. Pulmonary                  embolus.  Sonographer:     Mikki Harbor Referring Phys:  [id number] JENNIFER YATES Diagnosing Phys: Lorine Bears MD IMPRESSIONS  1. Left ventricular ejection fraction, by estimation, is 40 to 45%. The left ventricle has mildly decreased function. The left ventricle has no regional wall motion abnormalities. There is mild left ventricular hypertrophy. Left ventricular diastolic parameters are  indeterminate.  2. Right ventricular systolic function is normal. The right ventricular size is normal. There is normal pulmonary artery systolic pressure.  3. Left atrial size was severely dilated.  4. Right atrial size was moderately dilated.  5. The mitral valve is normal in structure. Moderate mitral valve regurgitation. Mild mitral stenosis. The mean mitral valve gradient is 4.0 mmHg. Moderate mitral annular calcification.  6. The aortic valve is normal in structure. Aortic valve regurgitation is not visualized. Aortic valve sclerosis/calcification is present, without any evidence of aortic stenosis.  7. The inferior vena cava is normal in size with greater than 50% respiratory variability, suggesting right atrial pressure of 3 mmHg.  TROPONINI <0.03 02/25/2018    Lab Results   Component Value Date   CHOL 257 (H) 11/23/2021   CHOL 144 11/02/2020   CHOL 284 (H) 12/11/2019   Lab Results  Component Value Date   HDL 96.40 11/23/2021   HDL 48 11/02/2020   HDL 100.10 12/11/2019   Lab Results  Component Value Date   LDLCALC 140 (H) 11/23/2021   LDLCALC 81 11/02/2020   LDLCALC 166 (H) 12/11/2019   Lab Results  Component Value Date   TRIG 104.0 11/23/2021   TRIG 76 11/02/2020   TRIG 72 11/02/2020   Lab Results  Component Value Date   CHOLHDL 3 11/23/2021   CHOLHDL 3.0 11/02/2020   CHOLHDL 3 12/11/2019   Lab Results  Component Value Date   LDLDIRECT 185.3 03/27/2013   LDLDIRECT 156.1 03/23/2012   LDLDIRECT 181.4 01/17/2011      Radiology: ECHOCARDIOGRAM COMPLETE  Result Date: 11/28/2022    ECHOCARDIOGRAM REPORT   Patient Name:   Frances Kent Date of Exam: 11/28/2022 Medical Rec #:  [id number]        Height:       63.0 in Accession #:    [id number]       Weight:       121.0 lb Date of Birth:  November 19, 1930        BSA:          1.562 m Patient Age:    89 years         BP:           113/75 mmHg Patient Gender: F                HR:           97 bpm. Exam Location:  [facility] Procedure: 2D Echo, Cardiac Doppler and Color Doppler Indications:     CHF  History:         Patient has prior history of Echocardiogram examinations, most                  recent 11/01/2020. CHF, Pacemaker, Stroke, Arrythmias:Atrial                  Fibrillation and Atrial Flutter, Signs/Symptoms:Dyspnea and                  Fatigue; Risk Factors:Hypertension and Dyslipidemia. Pulmonary                  embolus.  Sonographer:     Mikki Harbor Referring Phys:  [id number] JENNIFER YATES Diagnosing Phys: Lorine Bears MD IMPRESSIONS  1. Left ventricular ejection fraction, by estimation, is 40 to 45%. The left ventricle has mildly decreased function. The left ventricle has no regional wall motion abnormalities. There is mild left ventricular hypertrophy. Left ventricular diastolic parameters are  indeterminate.  2. Right ventricular systolic function is normal. The right ventricular size is normal. There is normal pulmonary artery systolic pressure.  3. Left atrial size was severely dilated.  4. Right atrial size was moderately dilated.  5. The mitral valve is normal in structure. Moderate mitral valve regurgitation. Mild mitral stenosis. The mean mitral valve gradient is 4.0 mmHg. Moderate mitral annular calcification.  6. The aortic valve is normal in structure. Aortic valve regurgitation is not visualized. Aortic valve sclerosis/calcification is present, without any evidence of aortic stenosis.  7. The inferior vena cava is normal in size with greater than 50% respiratory variability, suggesting right atrial pressure of 3 mmHg.  TROPONINI <0.03 02/25/2018    Lab Results   Component Value Date   CHOL 257 (H) 11/23/2021   CHOL 144 11/02/2020   CHOL 284 (H) 12/11/2019   Lab Results  Component Value Date   HDL 96.40 11/23/2021   HDL 48 11/02/2020   HDL 100.10 12/11/2019   Lab Results  Component Value Date   LDLCALC 140 (H) 11/23/2021   LDLCALC 81 11/02/2020   LDLCALC 166 (H) 12/11/2019   Lab Results  Component Value Date   TRIG 104.0 11/23/2021   TRIG 76 11/02/2020   TRIG 72 11/02/2020   Lab Results  Component Value Date   CHOLHDL 3 11/23/2021   CHOLHDL 3.0 11/02/2020   CHOLHDL 3 12/11/2019   Lab Results  Component Value Date   LDLDIRECT 185.3 03/27/2013   LDLDIRECT 156.1 03/23/2012   LDLDIRECT 181.4 01/17/2011      Radiology: ECHOCARDIOGRAM COMPLETE  Result Date: 11/28/2022    ECHOCARDIOGRAM REPORT   Patient Name:   Frances Kent Date of Exam: 11/28/2022 Medical Rec #:  [id number]        Height:       63.0 in Accession #:    [id number]       Weight:       121.0 lb Date of Birth:  November 19, 1930        BSA:          1.562 m Patient Age:    89 years         BP:           113/75 mmHg Patient Gender: F                HR:           97 bpm. Exam Location:  [facility] Procedure: 2D Echo, Cardiac Doppler and Color Doppler Indications:     CHF  History:         Patient has prior history of Echocardiogram examinations, most                  recent 11/01/2020. CHF, Pacemaker, Stroke, Arrythmias:Atrial                  Fibrillation and Atrial Flutter, Signs/Symptoms:Dyspnea and                  Fatigue; Risk Factors:Hypertension and Dyslipidemia. Pulmonary                  embolus.  Sonographer:     Mikki Harbor Referring Phys:  [id number] JENNIFER YATES Diagnosing Phys: Lorine Bears MD IMPRESSIONS  1. Left ventricular ejection fraction, by estimation, is 40 to 45%. The left ventricle has mildly decreased function. The left ventricle has no regional wall motion abnormalities. There is mild left ventricular hypertrophy. Left ventricular diastolic parameters are  indeterminate.  2. Right ventricular systolic function is normal. The right ventricular size is normal. There is normal pulmonary artery systolic pressure.  3. Left atrial size was severely dilated.  4. Right atrial size was moderately dilated.  5. The mitral valve is normal in structure. Moderate mitral valve regurgitation. Mild mitral stenosis. The mean mitral valve gradient is 4.0 mmHg. Moderate mitral annular calcification.  6. The aortic valve is normal in structure. Aortic valve regurgitation is not visualized. Aortic valve sclerosis/calcification is present, without any evidence of aortic stenosis.  7. The inferior vena cava is normal in size with greater than 50% respiratory variability, suggesting right atrial pressure of 3 mmHg.

## 2022-11-29 NOTE — Telephone Encounter (Signed)
Pt has been in the hospital since 11/26/2022 and looks like she is being discharged today.

## 2022-11-30 ENCOUNTER — Telehealth: Payer: Self-pay | Admitting: *Deleted

## 2022-11-30 NOTE — Progress Notes (Signed)
Care Coordination   Note   11/30/2022 Name: JIA LEOTTA MRN: [id number] DOB: [birth year]  JERRINE MILLESON is a 89 y.o. year old female who sees Darrick Huntsman, Mar Daring, MD for primary care. I reached out to Zadie Rhine by phone today to offer care coordination services.  Ms. Delashmit was given information about Care Coordination services today including:   The Care Coordination services include support from the care team which includes your Nurse Coordinator, Clinical Social Worker, or Pharmacist.  The Care Coordination team is here to help remove barriers to the health concerns and goals most important to you. Care Coordination services are voluntary, and the patient may decline or stop services at any time by request to their care team member.   Care Coordination Consent Status: Patient agreed to services and verbal consent obtained.   Follow up plan:  Telephone appointment with care coordination team member scheduled for:  12/05/2022  Encounter Outcome:  Patient Scheduled from referral   Burman Nieves, [facility] Care Coordination Care Guide Direct Dial: [phone]

## 2022-12-01 ENCOUNTER — Telehealth: Payer: Self-pay

## 2022-12-01 DIAGNOSIS — J189 Pneumonia, unspecified organism: Secondary | ICD-10-CM

## 2022-12-01 LAB — CULTURE, BLOOD (ROUTINE X 2): Culture: NO GROWTH

## 2022-12-01 NOTE — Transitions of Care (Post Inpatient/ED Visit) (Signed)
12/01/2022  Name: Frances Kent MRN: [id number] DOB: [birth year]  Today's TOC FU Call Status: Today's TOC FU Call Status:: Successful TOC FU Call Completed TOC FU Call Complete Date: 12/01/22 Patient's Name and Date of Birth confirmed.  Transition Care Management Follow-up Telephone Call Date of Discharge: 11/29/22 Discharge Facility: [facility]) Type of Discharge: Inpatient Admission Primary Inpatient Discharge Diagnosis:: CHF How have you been since you were released from the hospital?: Same (Daughter reports that patient is not eating well. Decrease in cough,  Weak.  No appetite,  Not weighing.  No shortness of breath today.) Any questions or concerns?: Yes Patient Questions/Concerns:: Reports she does not know if she should take her breathing treatments or not. Patient Questions/Concerns Addressed: Other: (reviewed with daughter)  Items Reviewed: Did you receive and understand the discharge instructions provided?: Yes Medications obtained,verified, and reconciled?: Yes (Medications Reviewed) Any new allergies since your discharge?: No Dietary orders reviewed?: Yes Type of Diet Ordered:: low salt Do you have support at home?: Yes Name of Support/Comfort Primary Source: Daughter [name]  Medications Reviewed Today: Medications Reviewed Today     Reviewed by Earlie Server, RN (Registered Nurse) on 12/01/22 at 1432  Med List Status: <None>   Medication Order Taking? Sig Documenting Provider Last Dose Status Informant  acetaminophen (TYLENOL) 500 MG tablet [id number] Yes Take 500 mg by mouth every 6 (six) hours as needed. [provider] Taking Active Self  amiodarone (PACERONE) 200 MG tablet [id number] Yes Take 0.5 tablets (100 mg total) by mouth daily. Home med. Sherlene Shams, MD Taking Active Self  benzonatate (TESSALON) 200 MG capsule [id number] Yes Take 1 capsule (200 mg total) by mouth 3 (three) times daily as needed for cough. Sherlene Shams, MD Taking Active Self  furosemide (LASIX) 20 MG tablet [id number] Yes Take 1 tablet (20 mg total) by mouth daily. Jonah Blue, MD Taking Active   guaiFENesin (MUCINEX) 600 MG 12 hr tablet [id number] Yes Take 1 tablet (600 mg total) by mouth 2 (two) times daily as needed for cough. Jonah Blue, MD Taking Active   levalbuterol Pauline Aus) 0.63 MG/3ML nebulizer solution [id number] Yes USE 1  VIAL IN NEBULIZER EVERY 4 HOURS AS NEEDED FOR WHEEZING AND FOR SHORTNESS OF Georgette Dover, MD Taking Active Self  levothyroxine (SYNTHROID) 50 MCG tablet [id number] Yes TAKE 1 TABLET BY MOUTH ONCE DAILY BEFORE BREAKFAST Sherlene Shams, MD Taking Active Self  lisinopril (ZESTRIL) 2.5 MG tablet [id number] Yes Take 1 tablet (2.5 mg total) by mouth daily. Jonah Blue, MD Taking Active   metoprolol succinate (TOPROL-XL) 25 MG 24 hr tablet [id number] Yes Take 1 tablet (25 mg total) by mouth 2 (two) times daily. Jonah Blue, MD Taking Active   mirtazapine (REMERON) 15 MG tablet [id number] Yes TAKE 1 TABLET BY MOUTH AT BEDTIME Sherlene Shams, MD Taking Active Self  pantoprazole (PROTONIX) 20 MG tablet [id number]  Take 1 tablet (20 mg total) by mouth 2 (two) times daily before a meal. Darlin Priestly, MD  Expired 11/26/22 2359 Self  potassium chloride SA (KLOR-CON M) 20 MEQ tablet [id number] Yes Take 20 mEq by mouth every other day. [provider] Taking Active Self           Med Note (ROSE, Shelle Iron Dec 01, 2022  2:32 PM) Currently taking daily  Probiotic CHEW [id number] Yes Chew 2 tablets by mouth daily. [provider] Taking Active Self  XARELTO 15 MG TABS tablet  [id number] Yes TAKE 1 TABLET EVERY DAY Sherlene Shams, MD Taking Active Self            Home Care and Equipment/Supplies: Were Home Health Services Ordered?: Yes Name of Home Health Agency:: [facility] Has Agency set up a time to come to your home?: Yes First Home Health Visit Date: 12/02/22 Any new  equipment or medical supplies ordered?: No  Functional Questionnaire: Do you need assistance with bathing/showering or dressing?: Yes Do you need assistance with meal preparation?: Yes Do you need assistance with eating?: No Do you have difficulty maintaining continence: No Do you need assistance with getting out of bed/getting out of a chair/moving?: Yes (has a lift chair, uses a walker.) Do you have difficulty managing or taking your medications?: Yes  Follow up appointments reviewed: PCP Follow-up appointment confirmed?: No (Daughter wants to call and make appointment herself) MD Provider Line Number:[phone] Given: No Specialist Hospital Follow-up appointment confirmed?: Yes Date of Specialist follow-up appointment?: 12/15/22 Follow-Up Specialty Provider:: Cardiology Do you need transportation to your follow-up appointment?: No Do you understand care options if your condition(s) worsen?: Yes-patient verbalized understanding  SDOH Interventions Today    Flowsheet Row Most Recent Value  SDOH Interventions   Food Insecurity Interventions Intervention Not Indicated  Transportation Interventions Intervention Not Indicated      Last Weight  Most recent update: 12/01/2022  2:36 PM    Weight  52.5 kg (115 lb 11.2 oz)             Interventions and discussions with daughter:  *Reviewed heart failure zones with daughter.  Encouraged daily weights and recording of weights. *Reviewed importance of eating well.  Reviewed importance of low salt diet *Reviewed all medications and daughters concerns. *Patient does not have hospital follow up and I offered to assist and daughter states she will call MD office herself to scheduled.  Reviewed importance of being seen soon.  Reviewed with daughter things to discuss with MD.  Noted hospice referral placed my MD on 11/24/2022 and it is noted that family refused.  Reviewed again with daughter and she stated that she would need to talk to her  mother ( the pt). Daughter interested in caregiver services and referral placed for Social worker. Daughter agreed to 30 day program however already scheduled with Complex Case Manager, George Ina for 12/05/2022 at 9 am.  Reminded daughter of the upcoming call and she is aware.  *Provided my contact information if daughter needs to call me back as well. *Reviewed intake for today:  1/2 a pimento cheese sandwich, 3 bites of chocolate pie, a few bites of potato salad, 1/2 of a single serving of applesauce, 1/2 caramel frappuccino. Encouraged daughter to try to get patient to eat and drink more. Encouraged protein shakes. *reviewed concerns for weakness is related to lack of nutrition to provide energy. *reviewed concern for increase in Lasix to daily with limited PO intake.  *reviewed with daughter to call PCP and cardiology for any additional concerns. Reviewed with daughter that someone is always on call.  *[facility] will call patient and daughter tomorrow per daughters reports.  *reviewed palliative and hospice care with daughter via phone.   This note sent to MD due to concerns.  Case discussed with Donna Christen RN, and George Ina RN for follow up on 12/05/2022.   Lonia Chimera, RN, BSN, CEN [facility] [phone]

## 2022-12-02 ENCOUNTER — Telehealth: Payer: Self-pay | Admitting: *Deleted

## 2022-12-02 LAB — CULTURE, BLOOD (ROUTINE X 2)
Culture: NO GROWTH
Special Requests: ADEQUATE

## 2022-12-02 NOTE — Progress Notes (Signed)
Care Coordination Note  12/02/2022 Name: Frances Kent MRN: [id number] DOB: 03-06-1930  Frances Kent is a 89 y.o. year old female who is a primary care patient of Darrick Huntsman, Mar Daring, MD and is actively engaged with the care management team. I reached out to Zadie Rhine by phone today to assist with scheduling an initial visit with the Licensed Clinical Social Worker  Follow up plan: Telephone appointment with care management team member scheduled for:12/06/2022  Burman Nieves, [facility] Care Coordination Care Guide Direct Dial: [phone]

## 2022-12-05 ENCOUNTER — Telehealth: Payer: Self-pay

## 2022-12-05 ENCOUNTER — Telehealth: Payer: Self-pay | Admitting: Internal Medicine

## 2022-12-05 ENCOUNTER — Ambulatory Visit: Payer: Self-pay

## 2022-12-05 NOTE — Patient Instructions (Addendum)
Visit Information  Thank you for taking time to visit with me today. Please don't hesitate to contact me if I can be of assistance to you.   Following are the goals we discussed today:   Goals Addressed             This Visit's Progress    Continued improvement post hospitalization and managment of health conditions       Interventions Today    Flowsheet Row Most Recent Value  Chronic Disease   Chronic disease during today's visit Congestive Heart Failure (CHF), Other  [generalized weakness]  General Interventions   General Interventions Discussed/Reviewed General Interventions Reviewed, Doctor Visits, Durable Medical Equipment (DME)  [evaluation of current treatment plan related to HF/ generalized weakness and patients adherence to plan as established by provider. Assessed for HF symptoms and weakness. Confirmed daughter contacted by Home health agency and given start of care date.]  Doctor Visits Discussed/Reviewed Doctor Visits Reviewed  [post hospital follow up appointment scheduled with primary care provider for 12/19/2022 at 11:00 am.  Daughter Windell Hummingbird notified of appointment. Advised to keep follow up appointments with provider. Confirmed patient has transport to appointments.]  Durable Medical Equipment (DME) Other  [Confirmed patient has all DME needed for home management.]  Exercise Interventions   Exercise Discussed/Reviewed Assistive device use and maintanence  Education Interventions   Education Provided Provided Education, Provided Printed Education  [Discussed heart failure signs/ symptoms and action plan.  Education article on heart failure action plan sent to patient in my chart.]  Provided Verbal Education On Other, Nutrition  [Advised daughter to call primary care provider/ cardiology for questions/ concerns. Re-discussed weakness due to lack of nutrition. Discussed increase proteins/ nutritional supplements.]  Mental Health Interventions   Mental Health  Discussed/Reviewed Other  [Confirmed with daughter patient has telephone appointment with social worker on 12/06/22.]  Nutrition Interventions   Nutrition Discussed/Reviewed Nutrition Reviewed, Increasing proteins  Pharmacy Interventions   Pharmacy Dicussed/Reviewed Pharmacy Topics Reviewed  Algis Downs to take medications as prescribed. Confirmed patient taking lasix daily and prescribed.]  Safety Interventions   Safety Discussed/Reviewed Fall Risk  [Assessed for falls.  Fall prevention discussed.]  Advanced Directive Interventions   Advanced Directives Discussed/Reviewed End of Life  End of Life Hospice, Palliative  [Hospice/ Palliative care re-discussed with daughter.]              Our next appointment is by telephone on 12/12/22 at 1:30 pm  Please call the care guide team at [phone] if you need to cancel or reschedule your appointment.   If you are experiencing a Mental Health or Behavioral Health Crisis or need someone to talk to, please call the Suicide and Crisis Lifeline: 988 call 1-800-273-TALK (toll free, 24 hour hotline)  Patient verbalizes understanding of instructions and care plan provided today and agrees to view in MyChart. Active MyChart status and patient understanding of how to access instructions and care plan via MyChart confirmed with patient.     George Ina RN,BSN,CCM [facility] Care Coordination [phone] direct line  Heart Failure Action Plan A heart failure action plan helps you understand what to do when you have symptoms of heart failure. Your action plan is a color-coded plan that lists the symptoms to watch for and indicates what actions to take. If you have symptoms in the red zone, you need medical care right away. If you have symptoms in the yellow zone, you are having problems. If you have symptoms in the Porshia Blizzard zone, you are  doing well. Follow the plan that was created by you and your health care provider. Review your plan each time you visit  your health care provider. Red zone These signs and symptoms mean you should get medical help right away: You have trouble breathing when resting. You have a dry cough that is getting worse. You have swelling or pain in your legs or abdomen that is getting worse. You suddenly gain more than 2-3 lb (0.9-1.4 kg) in 24 hours, or more than 5 lb (2.3 kg) in a week. This amount may be more or less depending on your condition. You have trouble staying awake or you feel confused. You have chest pain. You do not have an appetite. You pass out. You have worsening sadness or depression. If you have any of these symptoms, call your local emergency services (911 in the [place] right away. Do not drive yourself to the hospital. Yellow zone These signs and symptoms mean your condition may be getting worse and you should make some changes: You have trouble breathing when you are active, or you need to sleep with your head raised on extra pillows to help you breathe. You have swelling in your legs or abdomen. You gain 2-3 lb (0.9-1.4 kg) in 24 hours, or 5 lb (2.3 kg) in a week. This amount may be more or less depending on your condition. You get tired easily. You have trouble sleeping. You have a dry cough. If you have any of these symptoms: Contact your health care provider within the next day. Your health care provider may adjust your medicines. Zymire Turn[place] zone These signs mean you are doing well and can continue what you are doing: You do not have shortness of breath. You have very little swelling or no new swelling. Your weight is stable (no gain or loss). You have a normal activity level. You do not have chest pain or any other new symptoms. Follow these instructions at home: Take over-the-counter and prescription medicines only as told by your health care provider. Weigh yourself daily. Your target weight is __________ lb (__________ kg). Call your health care provider if you gain more than  __________ lb (__________ kg) in 24 hours, or more than __________ lb (__________ kg) in a week. Health care provider name: _____________________________________________________ Health care provider phone number: _____________________________________________________ Eat a heart-healthy diet. Work with a diet and nutrition specialist (dietitian) to create an eating plan that is best for you. Keep all follow-up visits. This is important. Where to find more information American Heart Association: Summary A heart failure action plan helps you understand what to do when you have symptoms of heart failure. Follow the action plan that was created by you and your health care provider. Get help right away if you have any symptoms in the red zone. This information is not intended to replace advice given to you by your health care provider. Make sure you discuss any questions you have with your health care provider. Document Revised: 05/11/2021 Document Reviewed: 09/16/2019 Elsevier Patient Education  2024 [employer].

## 2022-12-05 NOTE — Patient Outreach (Signed)
Care Coordination   12/05/2022 Name: Frances Kent MRN: [id number] DOB: 1930/12/26   Care Coordination Outreach Attempts:  An unsuccessful telephone outreach was attempted for a scheduled appointment today. HIPAA compliant message left with return call phone number.   Follow Up Plan:  Additional outreach attempts will be made to offer the patient care coordination information and services.   Encounter Outcome:  No Answer   Care Coordination Interventions:  No, not indicated    George Ina [facility] Care Coordination [phone] direct line

## 2022-12-05 NOTE — Patient Outreach (Addendum)
Care Coordination   Initial Visit Note   12/05/2022 Name: Frances Kent MRN: [id number] DOB: Feb 24, 1930  Frances Kent is a 89 y.o. year old female who sees Darrick Huntsman, Mar Daring, MD for primary care. I spoke with  daughter, Windell Hummingbird by phone today.  What matters to the patients health and wellness today?  Per chart review patient admitted to the hospital from 11/26/22 to 11/29/22 for HF.  Daughter denies patient having any increase in HF symptoms at this time. She states patient is very weak.  Daughter states she plans on taking off from work for a while so that she can stay with patient.  She states she and her sister alternate care and staying with patient.  Daughter states [facility] home health is scheduled for visit on 12/06/22.  Daughter states she has not contacted patients primary provider to set up post hospital follow up visit. Daughter verbally agreed to allow RNCM to set up appointment.  Daughter states patient has not been able to weigh due to being weak. Daughter states she will start to assist patient with obtaining weights. Daughter states patient has all DME needed. She states patient drinks nutritional supplements on occasion and she allowsher to have ice cream and favorite foods from certain restaurants she likes.  Daughter states her main goal is to try to get patient to eat a little more due to appetite not being good.    Goals Addressed             This Visit's Progress    Continued improvement post hospitalization and managment of health conditions       Interventions Today    Flowsheet Row Most Recent Value  Chronic Disease   Chronic disease during today's visit Congestive Heart Failure (CHF), Other  [generalized weakness]  General Interventions   General Interventions Discussed/Reviewed General Interventions Reviewed, Doctor Visits, Durable Medical Equipment (DME)  [evaluation of current treatment plan related to HF/ generalized weakness and patients adherence  to plan as established by provider. Assessed for HF symptoms and weakness. Confirmed daughter contacted by Home health agency and given start of care date.]  Doctor Visits Discussed/Reviewed Doctor Visits Reviewed  [post hospital follow up appointment scheduled with primary care provider for 12/19/2022 at 11:00 am.  Daughter Windell Hummingbird notified of appointment. Advised to keep follow up appointments with provider. Confirmed patient has transport to appointments.]  Durable Medical Equipment (DME) Other  [Confirmed patient has all DME needed for home management.]  Exercise Interventions   Exercise Discussed/Reviewed Assistive device use and maintanence  Education Interventions   Education Provided Provided Education, Provided Printed Education  [Discussed heart failure signs/ symptoms and action plan.  Education article on heart failure action plan sent to patient in my chart.]  Provided Verbal Education On Other, Nutrition  [Advised daughter to call primary care provider/ cardiology for questions/ concerns. Re-discussed weakness due to lack of nutrition. Discussed increase proteins/ nutritional supplements.]  Mental Health Interventions   Mental Health Discussed/Reviewed Other  [Confirmed with daughter patient has telephone appointment with social worker on 12/06/22.]  Nutrition Interventions   Nutrition Discussed/Reviewed Nutrition Reviewed, Increasing proteins  Pharmacy Interventions   Pharmacy Dicussed/Reviewed Pharmacy Topics Reviewed  Algis Downs to take medications as prescribed. Confirmed patient taking lasix daily and prescribed.]  Safety Interventions   Safety Discussed/Reviewed Fall Risk  [Assessed for falls.  Fall prevention discussed.]  Advanced Directive Interventions   Advanced Directives Discussed/Reviewed End of Life  End of Life Hospice, Palliative  [Hospice/ Palliative  care re-discussed with daughter.]              SDOH assessments and interventions completed:  Yes  SDOH  Interventions Today    Flowsheet Row Most Recent Value  SDOH Interventions   Food Insecurity Interventions Intervention Not Indicated  Housing Interventions Intervention Not Indicated  Transportation Interventions Intervention Not Indicated        Care Coordination Interventions:  Yes, provided   Follow up plan: Follow up call scheduled for 12/12/22    Encounter Outcome:  Patient Visit Completed   George Ina RN,BSN,CCM [facility] Care Coordination [phone] direct line

## 2022-12-05 NOTE — Telephone Encounter (Signed)
Home health called and the daughter requested that they come see her tomorrow. They wanted to let her provider know.

## 2022-12-05 NOTE — Telephone Encounter (Signed)
FYI

## 2022-12-06 ENCOUNTER — Ambulatory Visit: Payer: Self-pay | Admitting: *Deleted

## 2022-12-06 DIAGNOSIS — I052 Rheumatic mitral stenosis with insufficiency: Secondary | ICD-10-CM | POA: Diagnosis not present

## 2022-12-06 DIAGNOSIS — I5023 Acute on chronic systolic (congestive) heart failure: Secondary | ICD-10-CM | POA: Diagnosis not present

## 2022-12-06 DIAGNOSIS — N1832 Chronic kidney disease, stage 3b: Secondary | ICD-10-CM | POA: Diagnosis not present

## 2022-12-06 DIAGNOSIS — I480 Paroxysmal atrial fibrillation: Secondary | ICD-10-CM | POA: Diagnosis not present

## 2022-12-06 DIAGNOSIS — I495 Sick sinus syndrome: Secondary | ICD-10-CM | POA: Diagnosis not present

## 2022-12-06 DIAGNOSIS — E785 Hyperlipidemia, unspecified: Secondary | ICD-10-CM | POA: Diagnosis not present

## 2022-12-06 DIAGNOSIS — F411 Generalized anxiety disorder: Secondary | ICD-10-CM | POA: Diagnosis not present

## 2022-12-06 DIAGNOSIS — E034 Atrophy of thyroid (acquired): Secondary | ICD-10-CM | POA: Diagnosis not present

## 2022-12-06 DIAGNOSIS — I130 Hypertensive heart and chronic kidney disease with heart failure and stage 1 through stage 4 chronic kidney disease, or unspecified chronic kidney disease: Secondary | ICD-10-CM | POA: Diagnosis not present

## 2022-12-06 NOTE — Patient Outreach (Signed)
Care Coordination   Initial Visit Note   12/06/2022 Name: Frances Kent MRN: [id number] DOB: 1930-06-09  Frances Kent is a 89 y.o. year old female who sees Darrick Huntsman, Mar Daring, MD for primary care. I spoke with  Frances Kent Frances Kent's daughter by phone today.  What matters to the patients health and wellness today?  [name] discussed having no immediate community resource needs at this time. [name] would like a bath aid to assist patient in the home. CSW will provide patient's daughter with private duty options at this time.  This Child psychotherapist to continue to assess for [employer] needs.    Goals Addressed             This Visit's Progress    care coordination activities       Activities and task to complete in order to accomplish goals.   LEVELS OF CARE TASK Follow up with [facility] regarding request for a bath aid CSW to continue to assess for additional community resource needs.         SDOH assessments and interventions completed:  Yes  SDOH Interventions Today    Flowsheet Row Most Recent Value  SDOH Interventions   Food Insecurity Interventions Intervention Not Indicated  Housing Interventions Intervention Not Indicated  Transportation Interventions Intervention Not Indicated  Utilities Interventions Intervention Not Indicated  Financial Strain Interventions Intervention Not Indicated  Social Connections Interventions Intervention Not Indicated        Care Coordination Interventions:  Yes, provided  Interventions Today    Flowsheet Row Most Recent Value  Chronic Disease   Chronic disease during today's visit Congestive Heart Failure (CHF)  General Interventions   General Interventions Discussed/Reviewed General Interventions Discussed  [reports lack of energy-motivation due to medical condition-confirmed that [facility] assessed pt today-will be providing PT and OT- bath aid also requested]  Doctor Visits  Discussed/Reviewed PCP, Doctor Visits Reviewed  [cardiologist 10/31, PCP 12/20/22 currenlty on waiting list for sooner appt]  Mental Health Interventions   Mental Health Discussed/Reviewed Mental Health Discussed  Safety Interventions   Safety Discussed/Reviewed Fall Risk  [discussed using mobility aids while ambulating-walker, uses a wheelchair outside of the house, also has a bath chair]       Follow up plan: Follow up call scheduled for 12/20/22    Encounter Outcome:  Patient Visit Completed

## 2022-12-06 NOTE — Patient Instructions (Signed)
Visit Information  Thank you for taking time to visit with me today. Please don't hesitate to contact me if I can be of assistance to you.   Following are the goals we discussed today:   Goals Addressed             This Visit's Progress    care coordination activities       Activities and task to complete in order to accomplish goals.   LEVELS OF CARE TASK Follow up with [facility] regarding request for a bath aid CSW to continue to assess for additional community resource needs.         Our next appointment is by telephone on 12/20/22 at 2pm  Please call the care guide team at [phone] if you need to cancel or reschedule your appointment.   If you are experiencing a Mental Health or Behavioral Health Crisis or need someone to talk to, please call 911   Patient verbalizes understanding of instructions and care plan provided today and agrees to view in MyChart. Active MyChart status and patient understanding of how to access instructions and care plan via MyChart confirmed with patient.     Telephone follow up appointment with care management team member scheduled for: 12/20/22  Verna Czech, LCSW [place]  [facility], [facility] Licensed Clinical Social Worker Care Coordinator  Direct Dial: [phone]

## 2022-12-07 ENCOUNTER — Telehealth: Payer: Self-pay | Admitting: Internal Medicine

## 2022-12-07 DIAGNOSIS — F411 Generalized anxiety disorder: Secondary | ICD-10-CM | POA: Diagnosis not present

## 2022-12-07 DIAGNOSIS — E785 Hyperlipidemia, unspecified: Secondary | ICD-10-CM | POA: Diagnosis not present

## 2022-12-07 DIAGNOSIS — E034 Atrophy of thyroid (acquired): Secondary | ICD-10-CM | POA: Diagnosis not present

## 2022-12-07 DIAGNOSIS — I052 Rheumatic mitral stenosis with insufficiency: Secondary | ICD-10-CM | POA: Diagnosis not present

## 2022-12-07 DIAGNOSIS — I480 Paroxysmal atrial fibrillation: Secondary | ICD-10-CM | POA: Diagnosis not present

## 2022-12-07 DIAGNOSIS — I130 Hypertensive heart and chronic kidney disease with heart failure and stage 1 through stage 4 chronic kidney disease, or unspecified chronic kidney disease: Secondary | ICD-10-CM | POA: Diagnosis not present

## 2022-12-07 DIAGNOSIS — N1832 Chronic kidney disease, stage 3b: Secondary | ICD-10-CM | POA: Diagnosis not present

## 2022-12-07 DIAGNOSIS — I495 Sick sinus syndrome: Secondary | ICD-10-CM | POA: Diagnosis not present

## 2022-12-07 DIAGNOSIS — I5023 Acute on chronic systolic (congestive) heart failure: Secondary | ICD-10-CM | POA: Diagnosis not present

## 2022-12-07 NOTE — Telephone Encounter (Signed)
LMTCB

## 2022-12-07 NOTE — Telephone Encounter (Signed)
[facility] PT called and wanted to report to the provider that she  Saw patient for OT. Patients bp was 90/50 she also  Reports poor appetite, weak and no energy.    They also wanted verbal orders for OT 1week for 8 weeks  Please call back [phone]

## 2022-12-08 DIAGNOSIS — I052 Rheumatic mitral stenosis with insufficiency: Secondary | ICD-10-CM | POA: Diagnosis not present

## 2022-12-08 DIAGNOSIS — N1832 Chronic kidney disease, stage 3b: Secondary | ICD-10-CM | POA: Diagnosis not present

## 2022-12-08 DIAGNOSIS — F411 Generalized anxiety disorder: Secondary | ICD-10-CM | POA: Diagnosis not present

## 2022-12-08 DIAGNOSIS — I5023 Acute on chronic systolic (congestive) heart failure: Secondary | ICD-10-CM | POA: Diagnosis not present

## 2022-12-08 DIAGNOSIS — I130 Hypertensive heart and chronic kidney disease with heart failure and stage 1 through stage 4 chronic kidney disease, or unspecified chronic kidney disease: Secondary | ICD-10-CM | POA: Diagnosis not present

## 2022-12-08 DIAGNOSIS — E785 Hyperlipidemia, unspecified: Secondary | ICD-10-CM | POA: Diagnosis not present

## 2022-12-08 DIAGNOSIS — I495 Sick sinus syndrome: Secondary | ICD-10-CM | POA: Diagnosis not present

## 2022-12-08 DIAGNOSIS — E034 Atrophy of thyroid (acquired): Secondary | ICD-10-CM | POA: Diagnosis not present

## 2022-12-08 DIAGNOSIS — I480 Paroxysmal atrial fibrillation: Secondary | ICD-10-CM | POA: Diagnosis not present

## 2022-12-09 NOTE — Plan of Care (Signed)
 [facility] Tonsillectomy/Adenoidectomy, Postoperative PEDS care plan entered in error.

## 2022-12-12 ENCOUNTER — Ambulatory Visit: Payer: Self-pay

## 2022-12-12 ENCOUNTER — Other Ambulatory Visit: Payer: Self-pay | Admitting: Pulmonary Disease

## 2022-12-12 DIAGNOSIS — I480 Paroxysmal atrial fibrillation: Secondary | ICD-10-CM | POA: Diagnosis not present

## 2022-12-12 NOTE — Patient Outreach (Signed)
Care Coordination   12/12/2022 Name: Frances Kent MRN: [id number] DOB: [birth year]   Care Coordination Outreach Attempts:  An unsuccessful telephone outreach was attempted for a scheduled appointment today. Unable to reach patient and leave voice message due to mailbox being full.   Follow Up Plan:  Additional outreach attempts will be made to offer the patient care coordination information and services.   Encounter Outcome:  No Answer   Care Coordination Interventions:  No, not indicated    George Ina [facility] Care Coordination [phone] direct line

## 2022-12-13 ENCOUNTER — Telehealth: Payer: Self-pay

## 2022-12-13 DIAGNOSIS — I495 Sick sinus syndrome: Secondary | ICD-10-CM | POA: Diagnosis not present

## 2022-12-13 DIAGNOSIS — I480 Paroxysmal atrial fibrillation: Secondary | ICD-10-CM | POA: Diagnosis not present

## 2022-12-13 DIAGNOSIS — I130 Hypertensive heart and chronic kidney disease with heart failure and stage 1 through stage 4 chronic kidney disease, or unspecified chronic kidney disease: Secondary | ICD-10-CM | POA: Diagnosis not present

## 2022-12-13 DIAGNOSIS — I052 Rheumatic mitral stenosis with insufficiency: Secondary | ICD-10-CM | POA: Diagnosis not present

## 2022-12-13 DIAGNOSIS — E785 Hyperlipidemia, unspecified: Secondary | ICD-10-CM | POA: Diagnosis not present

## 2022-12-13 DIAGNOSIS — I5023 Acute on chronic systolic (congestive) heart failure: Secondary | ICD-10-CM | POA: Diagnosis not present

## 2022-12-13 DIAGNOSIS — N1832 Chronic kidney disease, stage 3b: Secondary | ICD-10-CM | POA: Diagnosis not present

## 2022-12-13 DIAGNOSIS — E034 Atrophy of thyroid (acquired): Secondary | ICD-10-CM | POA: Diagnosis not present

## 2022-12-13 DIAGNOSIS — F411 Generalized anxiety disorder: Secondary | ICD-10-CM | POA: Diagnosis not present

## 2022-12-13 NOTE — Patient Outreach (Signed)
Care Coordination   Follow Up Visit Note   12/13/2022 Name: Frances Kent MRN: [id number] DOB: [birth year]  Frances Kent is a 89 y.o. year old female who sees Darrick Huntsman, Mar Daring, MD for primary care. I spoke with  daughter, Windell Hummingbird by phone today.  What matters to the patients health and wellness today?  Daughter states patient is doing ok. She states home health services have started for patient.  Daughter states home health is working on getting patient a Neurosurgeon.  Daughter states patient is scheduled to have palliative care visit next week. She states patient has hospital follow up visit with primary care provider on 12/19/22 and follow up visit with cardiologist on 12/15/22.  Daughter denies patient having any increase HF symptoms. Denies recent falls.  Daughter states patient still tires easily.  She states she will discuss this at upcoming  provider appointments and inquire if patient's medications could be making her more fatigued.     Goals Addressed             This Visit's Progress    Continued improvement post hospitalization and managment of health conditions       Interventions Today    Flowsheet Row Most Recent Value  Chronic Disease   Chronic disease during today's visit Congestive Heart Failure (CHF), Other  [generalized weakness]  General Interventions   General Interventions Discussed/Reviewed General Interventions Reviewed, Doctor Visits  [evaluation of current treatment plan for mentioned health conditions and patients adherence to plan as established by provider. Inquired if home health services have started. Assessed for any new/ ongoing symptoms.]  Doctor Visits Discussed/Reviewed Doctor Visits Reviewed  Algis Downs to keep follow up visits with provider.  Confirmed patient has post hospital follow up visit with provider.]  Education Interventions   Education Provided --  [Reviewed heart failure symptoms. Advised to notify provider for symptoms and call 911  for severe symptoms.]  Nutrition Interventions   Nutrition Discussed/Reviewed Nutrition Reviewed  Pharmacy Interventions   Pharmacy Dicussed/Reviewed Pharmacy Topics Reviewed  Safety Interventions   Safety Discussed/Reviewed Fall Risk  [assessed for falls. Fall precautions discussed.]              SDOH assessments and interventions completed:  No     Care Coordination Interventions:  Yes, provided   Follow up plan: Follow up call scheduled for 01/03/23    Encounter Outcome:  Patient Visit Completed   George Ina RN,BSN,CCM [facility]  [facility], [facility] coordinator / Case Manager Phone: [phone]

## 2022-12-13 NOTE — Patient Instructions (Signed)
Visit Information  Thank you for taking time to visit with me today. Please don't hesitate to contact me if I can be of assistance to you.   Following are the goals we discussed today:   Goals Addressed             This Visit's Progress    Continued improvement post hospitalization and managment of health conditions       Interventions Today    Flowsheet Row Most Recent Value  Chronic Disease   Chronic disease during today's visit Congestive Heart Failure (CHF), Other  [generalized weakness]  General Interventions   General Interventions Discussed/Reviewed General Interventions Reviewed, Doctor Visits  [evaluation of current treatment plan for mentioned health conditions and patients adherence to plan as established by provider. Inquired if home health services have started. Assessed for any new/ ongoing symptoms.]  Doctor Visits Discussed/Reviewed Doctor Visits Reviewed  Algis Downs to keep follow up visits with provider.  Confirmed patient has post hospital follow up visit with provider.]  Education Interventions   Education Provided --  [Reviewed heart failure symptoms. Advised to notify provider for symptoms and call 911 for severe symptoms.]  Nutrition Interventions   Nutrition Discussed/Reviewed Nutrition Reviewed  Pharmacy Interventions   Pharmacy Dicussed/Reviewed Pharmacy Topics Reviewed  Safety Interventions   Safety Discussed/Reviewed Fall Risk  [assessed for falls. Fall precautions discussed.]              Our next appointment is by telephone on 01/03/23 at 3 pm  Please call the care guide team at [phone] if you need to cancel or reschedule your appointment.   If you are experiencing a Mental Health or Behavioral Health Crisis or need someone to talk to, please call the Suicide and Crisis Lifeline: 988 call 1-800-273-TALK (toll free, 24 hour hotline)  Patient verbalizes understanding of instructions and care plan provided today and agrees to view in MyChart.  Active MyChart status and patient understanding of how to access instructions and care plan via MyChart confirmed with patient.     George Ina RN,BSN,CCM [place]  [facility], [facility] coordinator / Case Manager Phone: [phone]

## 2022-12-14 DIAGNOSIS — I052 Rheumatic mitral stenosis with insufficiency: Secondary | ICD-10-CM | POA: Diagnosis not present

## 2022-12-14 DIAGNOSIS — E785 Hyperlipidemia, unspecified: Secondary | ICD-10-CM | POA: Diagnosis not present

## 2022-12-14 DIAGNOSIS — N1832 Chronic kidney disease, stage 3b: Secondary | ICD-10-CM | POA: Diagnosis not present

## 2022-12-14 DIAGNOSIS — I5023 Acute on chronic systolic (congestive) heart failure: Secondary | ICD-10-CM | POA: Diagnosis not present

## 2022-12-14 DIAGNOSIS — E034 Atrophy of thyroid (acquired): Secondary | ICD-10-CM | POA: Diagnosis not present

## 2022-12-14 DIAGNOSIS — I480 Paroxysmal atrial fibrillation: Secondary | ICD-10-CM | POA: Diagnosis not present

## 2022-12-14 DIAGNOSIS — F411 Generalized anxiety disorder: Secondary | ICD-10-CM | POA: Diagnosis not present

## 2022-12-14 DIAGNOSIS — I495 Sick sinus syndrome: Secondary | ICD-10-CM | POA: Diagnosis not present

## 2022-12-14 DIAGNOSIS — I130 Hypertensive heart and chronic kidney disease with heart failure and stage 1 through stage 4 chronic kidney disease, or unspecified chronic kidney disease: Secondary | ICD-10-CM | POA: Diagnosis not present

## 2022-12-15 DIAGNOSIS — I4892 Unspecified atrial flutter: Secondary | ICD-10-CM | POA: Diagnosis not present

## 2022-12-15 DIAGNOSIS — I5020 Unspecified systolic (congestive) heart failure: Secondary | ICD-10-CM | POA: Diagnosis not present

## 2022-12-15 DIAGNOSIS — I480 Paroxysmal atrial fibrillation: Secondary | ICD-10-CM | POA: Diagnosis not present

## 2022-12-19 ENCOUNTER — Encounter: Payer: Self-pay | Admitting: Internal Medicine

## 2022-12-19 ENCOUNTER — Telehealth: Payer: Self-pay

## 2022-12-19 ENCOUNTER — Ambulatory Visit: Payer: Medicare HMO | Admitting: Internal Medicine

## 2022-12-19 VITALS — BP 108/62 | HR 57 | Ht 63.0 in | Wt 113.2 lb

## 2022-12-19 DIAGNOSIS — F409 Phobic anxiety disorder, unspecified: Secondary | ICD-10-CM

## 2022-12-19 DIAGNOSIS — F5105 Insomnia due to other mental disorder: Secondary | ICD-10-CM | POA: Diagnosis not present

## 2022-12-19 DIAGNOSIS — F418 Other specified anxiety disorders: Secondary | ICD-10-CM

## 2022-12-19 DIAGNOSIS — I4891 Unspecified atrial fibrillation: Secondary | ICD-10-CM

## 2022-12-19 DIAGNOSIS — R1319 Other dysphagia: Secondary | ICD-10-CM | POA: Diagnosis not present

## 2022-12-19 DIAGNOSIS — E440 Moderate protein-calorie malnutrition: Secondary | ICD-10-CM

## 2022-12-19 DIAGNOSIS — R531 Weakness: Secondary | ICD-10-CM | POA: Diagnosis not present

## 2022-12-19 DIAGNOSIS — N179 Acute kidney failure, unspecified: Secondary | ICD-10-CM | POA: Insufficient documentation

## 2022-12-19 DIAGNOSIS — R5383 Other fatigue: Secondary | ICD-10-CM

## 2022-12-19 DIAGNOSIS — E875 Hyperkalemia: Secondary | ICD-10-CM | POA: Insufficient documentation

## 2022-12-19 DIAGNOSIS — Z09 Encounter for follow-up examination after completed treatment for conditions other than malignant neoplasm: Secondary | ICD-10-CM

## 2022-12-19 LAB — IBC + FERRITIN
Ferritin: 84 ng/mL (ref 10.0–291.0)
Iron: 65 ug/dL (ref 42–145)
Saturation Ratios: 23.3 % (ref 20.0–50.0)
TIBC: 278.6 ug/dL (ref 250.0–450.0)
Transferrin: 199 mg/dL — ABNORMAL LOW (ref 212.0–360.0)

## 2022-12-19 LAB — BASIC METABOLIC PANEL
BUN: 21 mg/dL (ref 6–23)
CO2: 26 meq/L (ref 19–32)
Calcium: 9.8 mg/dL (ref 8.4–10.5)
Chloride: 103 meq/L (ref 96–112)
Creatinine, Ser: 1.55 mg/dL — ABNORMAL HIGH (ref 0.40–1.20)
GFR: 29.36 mL/min — ABNORMAL LOW (ref >60.00)
Glucose, Bld: 105 mg/dL — ABNORMAL HIGH (ref 70–99)
Potassium: 5.2 meq/L — ABNORMAL HIGH (ref 3.5–5.1)
Sodium: 138 meq/L (ref 135–145)

## 2022-12-19 MED ORDER — PANTOPRAZOLE SODIUM 20 MG PO TBEC: 20.0000 mg | DELAYED_RELEASE_TABLET | Freq: Every day | ORAL | 1 refills | Status: DC

## 2022-12-19 MED ORDER — SERTRALINE HCL 25 MG PO TABS: 25.0000 mg | ORAL_TABLET | Freq: Every day | ORAL | 1 refills | Status: DC

## 2022-12-19 NOTE — Assessment & Plan Note (Signed)
Secondary to chronc illness,  deconditioning .   Home PT in progress

## 2022-12-19 NOTE — Progress Notes (Signed)
Subjective:  Patient ID: Frances Kent, female    DOB: [birth year]  Age: 89 y.o. MRN: [id number]  CC: The primary encounter diagnosis was Other fatigue. Diagnoses of Hospital discharge follow-up, Insomnia due to anxiety and fear, Malnutrition of moderate degree (HCC), Generalized weakness, Depression with anxiety, Esophageal dysphagia, Atrial fibrillation status post cardioversion (HCC), Hyperkalemia, and Acute renal failure, unspecified acute renal failure type [facility]) were also pertinent to this visit.   HPI Frances Kent presents for  Chief Complaint  Patient presents with   Hospitalization Follow-up   Catheryn was admitted to [facility] on Oct 12 with generalized weakness and pulmonary edema.  Treated initially for PNA but dx changed to CHF due to afib/RBR   and abx stopped.  Cardizem changed to metoprolol  cardiology and speech pathology consults done. Amiodarone loaded.  No further workup per speech path for dysphagia.  Discharged home on Oct 15 FULL CODE per patient requested   Cc: fatigue, weakness  "I feel terrible"  "I have to make myself eat"  " I'm just  so tired ."  She  Has been waiting to meed the  assigned  home health aide to help with her baths,  and the home health RN's first visit was last week  and the physical therapist 3 weeks ago.  coming once a week . She has not been diligent about doing the exercises  "I Feel nauseated once in a while"  ,has had some dry heaves . Not occurring daily . More like once a week  at the most.  Putting in her dentures can aggravate it. Sometimes wakes up nauseated. around 8:30 am.  Reviewed meds;  taking iron and lasix daily  r  Outpatient Medications Prior to Visit  Medication Sig Dispense Refill   acetaminophen (TYLENOL) 500 MG tablet Take 500 mg by mouth every 6 (six) hours as needed.     amiodarone (PACERONE) 200 MG tablet Take 0.5 tablets (100 mg total) by mouth daily. Home med.     benzonatate (TESSALON) 200 MG capsule Take 1 capsule (200  mg total) by mouth 3 (three) times daily as needed for cough. 60 capsule 0   furosemide (LASIX) 20 MG tablet Take 1 tablet (20 mg total) by mouth daily. 30 tablet 1   guaiFENesin (MUCINEX) 600 MG 12 hr tablet Take 1 tablet (600 mg total) by mouth 2 (two) times daily as needed for cough. 30 tablet 1   levalbuterol (XOPENEX) 0.63 MG/3ML nebulizer solution USE 1  VIAL IN NEBULIZER EVERY 4 HOURS AS NEEDED FOR WHEEZING AND FOR SHORTNESS OF BREATH 150 mL 5   levothyroxine (SYNTHROID) 50 MCG tablet TAKE 1 TABLET BY MOUTH ONCE DAILY BEFORE BREAKFAST 90 tablet 0   metoprolol succinate (TOPROL-XL) 25 MG 24 hr tablet Take 1 tablet (25 mg total) by mouth 2 (two) times daily. 60 tablet 1   potassium chloride SA (KLOR-CON M) 20 MEQ tablet Take 20 mEq by mouth every other day.     Probiotic CHEW Chew 2 tablets by mouth daily.     XARELTO 15 MG TABS tablet TAKE 1 TABLET EVERY DAY 90 tablet 3   lisinopril (ZESTRIL) 2.5 MG tablet Take 1 tablet (2.5 mg total) by mouth daily. 30 tablet 1   mirtazapine (REMERON) 15 MG tablet TAKE 1 TABLET BY MOUTH AT BEDTIME 90 tablet 0   pantoprazole (PROTONIX) 20 MG tablet Take 1 tablet (20 mg total) by mouth 2 (two) times daily before a meal. 180 tablet 0  No facility-administered medications prior to visit.    Review of Systems;  Patient denies headache, fevers, malaise, unintentional weight loss, skin rash, eye pain, sinus congestion and sinus pain, sore throat, dysphagia,  hemoptysis , cough, dyspnea, wheezing, chest pain, palpitations, orthopnea, edema, abdominal pain, nausea, melena, diarrhea, constipation, flank pain, dysuria, hematuria, urinary  Frequency, nocturia, numbness, tingling, seizures,  Focal weakness, Loss of consciousness,  Tremor, insomnia, depression, anxiety, and suicidal ideation.      Objective:  BP 108/62   Pulse (!) 57   Ht 5\' 3"  (1.6 m)   Wt 113 lb 3.2 oz (51.3 kg)   SpO2 95%   BMI 20.05 kg/m   BP Readings from Last 3 Encounters:  12/19/22  108/62  11/29/22 101/66  11/22/22 120/80    Wt Readings from Last 3 Encounters:  12/19/22 113 lb 3.2 oz (51.3 kg)  12/01/22 115 lb 11.2 oz (52.5 kg)  11/29/22 119 lb 14.9 oz (54.4 kg)    Physical Exam Vitals reviewed.  Constitutional:      General: She is not in acute distress.    Appearance: Normal appearance. She is normal weight. She is not ill-appearing, toxic-appearing or diaphoretic.     Comments: Chronically ill appearing  HENT:     Head: Normocephalic.  Eyes:     General: No scleral icterus.       Right eye: No discharge.        Left eye: No discharge.     Conjunctiva/sclera: Conjunctivae normal.  Cardiovascular:     Rate and Rhythm: Normal rate and regular rhythm.     Heart sounds: Normal heart sounds.  Pulmonary:     Effort: Pulmonary effort is normal. No respiratory distress.     Breath sounds: Normal breath sounds.  Musculoskeletal:        General: Normal range of motion.  Skin:    General: Skin is warm and dry.  Neurological:     General: No focal deficit present.     Mental Status: She is alert and oriented to person, place, and time. Mental status is at baseline.  Psychiatric:        Mood and Affect: Mood normal.        Behavior: Behavior normal.        Thought Content: Thought content normal.        Judgment: Judgment normal.    Lab Results  Component Value Date   HGBA1C 5.8 (H) 11/02/2020   HGBA1C 6.1 (H) 10/16/2020    Lab Results  Component Value Date   CREATININE 1.55 (H) 12/19/2022   CREATININE 1.09 (H) 11/29/2022   CREATININE 1.11 (H) 11/28/2022    Lab Results  Component Value Date   WBC 6.6 11/29/2022   HGB 11.4 (L) 11/29/2022   HCT 35.4 (L) 11/29/2022   PLT 325 11/29/2022   GLUCOSE 105 (H) 12/19/2022   CHOL 257 (H) 11/23/2021   TRIG 104.0 11/23/2021   HDL 96.40 11/23/2021   LDLDIRECT 185.3 03/27/2013   LDLCALC 140 (H) 11/23/2021   ALT 21 11/26/2022   AST 21 11/26/2022   NA 138 12/19/2022   K 5.2 No hemolysis seen (H)  12/19/2022   CL 103 12/19/2022   CREATININE 1.55 (H) 12/19/2022   BUN 21 12/19/2022   CO2 26 12/19/2022   TSH 3.62 11/16/2022   INR 3.0 (H) 10/15/2020   HGBA1C 5.8 (H) 11/02/2020    ECHOCARDIOGRAM COMPLETE  Result Date: 11/28/2022    ECHOCARDIOGRAM REPORT   Patient Name:  Frances Kent Date of Exam: 11/28/2022 Medical Rec #:  [id number]        Height:       63.0 in Accession #:    [id number]       Weight:       121.0 lb Date of Birth:  [birth year]        BSA:          1.562 m Patient Age:    89 years         BP:           113/75 mmHg Patient Gender: F                HR:           97 bpm. Exam Location:  [facility] Procedure: 2D Echo, Cardiac Doppler and Color Doppler Indications:     CHF  History:         Patient has prior history of Echocardiogram examinations, most                  recent 11/01/2020. CHF, Pacemaker, Stroke, Arrythmias:Atrial                  Fibrillation and Atrial Flutter, Signs/Symptoms:Dyspnea and                  Fatigue; Risk Factors:Hypertension and Dyslipidemia. Pulmonary                  embolus.  Sonographer:     Mikki Harbor Referring Phys:  [id number] JENNIFER YATES Diagnosing Phys: Lorine Bears MD IMPRESSIONS  1. Left ventricular ejection fraction, by estimation, is 40 to 45%. The left ventricle has mildly decreased function. The left ventricle has no regional wall motion abnormalities. There is mild left ventricular hypertrophy. Left ventricular diastolic parameters are indeterminate.  2. Right ventricular systolic function is normal. The right ventricular size is normal. There is normal pulmonary artery systolic pressure.  3. Left atrial size was severely dilated.  4. Right atrial size was moderately dilated.  5. The mitral valve is normal in structure. Moderate mitral valve regurgitation. Mild mitral stenosis. The mean mitral valve gradient is 4.0 mmHg. Moderate mitral annular calcification.  6. The aortic valve is normal in structure. Aortic valve regurgitation is not  visualized. Aortic valve sclerosis/calcification is present, without any evidence of aortic stenosis.  7. The inferior vena cava is normal in size with greater than 50% respiratory variability, suggesting right atrial pressure of 3 mmHg. FINDINGS  Left Ventricle: Left ventricular ejection fraction, by estimation, is 40 to 45%. The left ventricle has mildly decreased function. The left ventricle has no regional wall motion abnormalities. The left ventricular internal cavity size was normal in size. There is mild left ventricular hypertrophy. Left ventricular diastolic parameters are indeterminate. Right Ventricle: The right ventricular size is normal. No increase in right ventricular wall thickness. Right ventricular systolic function is normal. There is normal pulmonary artery systolic pressure. The tricuspid regurgitant velocity is 2.73 m/s, and  with an assumed right atrial pressure of 5 mmHg, the estimated right ventricular systolic pressure is 34.8 mmHg. Left Atrium: Left atrial size was severely dilated. Right Atrium: Right atrial size was moderately dilated. Pericardium: Trivial pericardial effusion is present. The pericardial effusion is circumferential. Mitral Valve: The mitral valve is normal in structure. There is moderate thickening of the mitral valve leaflet(s). There is moderate calcification of the mitral valve leaflet(s). Moderate mitral annular calcification. Moderate mitral valve regurgitation. Mild  mitral valve stenosis. MV peak gradient, 16.2 mmHg. The mean mitral valve gradient is 4.0 mmHg. Tricuspid Valve: The tricuspid valve is normal in structure. Tricuspid valve regurgitation is mild . No evidence of tricuspid stenosis. Aortic Valve: The aortic valve is normal in structure. Aortic valve regurgitation is not visualized. Aortic valve sclerosis/calcification is present, without any evidence of aortic stenosis. Aortic valve mean gradient measures 2.0 mmHg. Aortic valve peak  gradient measures  4.1 mmHg. Aortic valve area, by VTI measures 2.31 cm. Pulmonic Valve: The pulmonic valve was normal in structure. Pulmonic valve regurgitation is mild. No evidence of pulmonic stenosis. Aorta: The aortic root is normal in size and structure. Venous: The inferior vena cava is normal in size with greater than 50% respiratory variability, suggesting right atrial pressure of 3 mmHg. IAS/Shunts: No atrial level shunt detected by color flow Doppler.  LEFT VENTRICLE PLAX 2D LVIDd:         3.10 cm LVIDs:         2.50 cm LV PW:         1.20 cm LV IVS:        1.00 cm LVOT diam:     2.10 cm LV SV:         47 LV SV Index:   30 LVOT Area:     3.46 cm  RIGHT VENTRICLE RV Basal diam:  3.35 cm RV Mid diam:    3.30 cm RV S prime:     8.81 cm/s LEFT ATRIUM              Index        RIGHT ATRIUM           Index LA diam:        5.00 cm  3.20 cm/m   RA Area:     18.80 cm LA Vol (A2C):   107.0 ml 68.51 ml/m  RA Volume:   53.00 ml  33.94 ml/m LA Vol (A4C):   88.4 ml  56.60 ml/m LA Biplane Vol: 104.0 ml 66.59 ml/m  AORTIC VALVE                    PULMONIC VALVE AV Area (Vmax):    2.49 cm     PV Vmax:       0.76 m/s AV Area (Vmean):   2.15 cm     PV Peak grad:  2.3 mmHg AV Area (VTI):     2.31 cm AV Vmax:           101.53 cm/s AV Vmean:          69.767 cm/s AV VTI:            0.205 m AV Peak Grad:      4.1 mmHg AV Mean Grad:      2.0 mmHg LVOT Vmax:         72.97 cm/s LVOT Vmean:        43.400 cm/s LVOT VTI:          0.137 m LVOT/AV VTI ratio: 0.67  AORTA Ao Root diam: 3.30 cm Ao Asc diam:  2.90 cm MITRAL VALVE                TRICUSPID VALVE MV Area (PHT): 2.12 cm     TR Peak grad:   29.8 mmHg MV Area VTI:   0.77 cm     TR Vmax:        273.00 cm/s MV Peak grad:  16.2 mmHg MV Mean grad:  4.0 mmHg     SHUNTS MV Vmax:       2.01 m/s     Systemic VTI:  0.14 m MV Vmean:      83.7 cm/s    Systemic Diam: 2.10 cm MV Decel Time: 357 msec MV E velocity: 174.00 cm/s Lorine Bears MD Electronically signed by Lorine Bears MD Signature  Date/Time: 11/28/2022/2:46:49 PM    Final     Assessment & Plan:  .Other fatigue -     IBC + Ferritin -     Basic metabolic panel  Hospital discharge follow-up Assessment & Plan: Patient is stable post discharge and has no new issues or questions about discharge plans at the visit today for hospital follow up. All labs , imaging studies and progress notes from admission were reviewed with patient today      Insomnia due to anxiety and fear Assessment & Plan: Stopping miirtazpine  due to contraindcation with amiodarone    Malnutrition of moderate degree (HCC) Assessment & Plan: Secondary to anorexia,  has not improved with  15 mg  mirtazipine   will dc medication given use of amiodarone    Generalized weakness Assessment & Plan: Secondary to chronc illness,  deconditioning .   Home PT in progress    Depression with anxiety Assessment & Plan: No improvement with mirtazipine ; now contrindicated with use of amiodarone . Trial of sertraline    Esophageal dysphagia Assessment & Plan: Secondary to stricture noted on Jan 2023 EGD.  1.3 cm diameter  Not stretchable. Seen by speech therapy during hospitalization , no changes made to regimen .  Continue protonix 20 mg bid    Atrial fibrillation status post cardioversion [facility]) Assessment & Plan: S/p amiodarone load during hospitalization,  now rate controlled with metoprolol and amiodarone    Hyperkalemia Assessment & Plan: Stopping potassium and lisinopril. Repeat BMET on Thursday    Acute renal failure, unspecified acute renal failure type [facility]) Assessment & Plan: Secondary to use of furosemide and decreased oral intake. Advised to increase water intake, stop lisinopril.  Taking furosemide edaiy since Discharge on Oct 15; may need to reduce if GFR does not improve    Other orders -     Sertraline HCl; Take 1 tablet (25 mg total) by mouth daily.  Dispense: 90 tablet; Refill: 1     I provided 30 minutes of face-to-face  time during this encounter reviewing patient's last visit with me, patient's  most recent visit with cardiology,  nephrology,  and neurology,  recent surgical and non surgical procedures, previous  labs and imaging studies, counseling on currently addressed issues,  and post visit ordering to diagnostics and therapeutics .   Follow-up: Return in about 3 months (around 03/21/2023).   Sherlene Shams, MD

## 2022-12-19 NOTE — Assessment & Plan Note (Addendum)
No improvement with mirtazipine ; now contrindicated with use of amiodarone . Trial of sertraline

## 2022-12-19 NOTE — Assessment & Plan Note (Addendum)
Secondary to anorexia,  has not improved with  15 mg  mirtazipine   will dc medication given use of amiodarone

## 2022-12-19 NOTE — Assessment & Plan Note (Signed)
S/p amiodarone load during hospitalization,  now rate controlled with metoprolol and amiodarone

## 2022-12-19 NOTE — Patient Instructions (Addendum)
STOP THE LISINOPRIL.  You do not need it  Please confirm whether  Frances Kent is taking  mirtazapine   (it has not been refilled since May) it is for low appetite and low energy    I am restarting protonix in the evening to address the morning  nausea   Do not resume iron unless I tell you to (it can cause nausea)    You need to drink at least 32 ounces of water ( or any non caffeinated beverage (  daily to keep yourself hydrated and your kidney flushed

## 2022-12-19 NOTE — Assessment & Plan Note (Signed)
Secondary to stricture noted on Jan 2023 EGD.  1.3 cm diameter  Not stretchable. Seen by speech therapy during hospitalization , no changes made to regimen .  Continue protonix 20 mg bid

## 2022-12-19 NOTE — Assessment & Plan Note (Signed)
Stopping potassium and lisinopril. Repeat BMET on Thursday

## 2022-12-19 NOTE — Assessment & Plan Note (Addendum)
Stopping miirtazpine  due to contraindcation with amiodarone

## 2022-12-19 NOTE — Assessment & Plan Note (Signed)
Patient is stable post discharge and has no new issues or questions about discharge plans at the visit today for hospital follow up. All labs , imaging studies and progress notes from admission were reviewed with patient today   

## 2022-12-19 NOTE — Assessment & Plan Note (Addendum)
Secondary to use of furosemide and decreased oral intake. Advised to increase water intake, stop lisinopril.  Taking furosemide edaiy since Discharge on Oct 15; may need to reduce if GFR does not improve

## 2022-12-19 NOTE — Telephone Encounter (Signed)
[name], Gwendalyn Ege, called to state patient is still taking the mirtazapine (REMERON) 15 MG tablet and she also takes her pantoprazole (PROTONIX) 20 MG tablet (pantoprazole - twice a day - one in the morning and one in the evening).  [name] states she was unable to come to patient's appointment today and wanted to be sure Dr. Duncan Dull was informed about these medications.  [name] states we may call her if we have any questions.

## 2022-12-20 ENCOUNTER — Ambulatory Visit: Payer: Self-pay | Admitting: *Deleted

## 2022-12-20 ENCOUNTER — Telehealth: Payer: Self-pay

## 2022-12-20 DIAGNOSIS — E875 Hyperkalemia: Secondary | ICD-10-CM

## 2022-12-20 NOTE — Telephone Encounter (Signed)
Lab ordered for lab appt per lab result note.

## 2022-12-20 NOTE — Patient Outreach (Signed)
  Care Coordination   Follow Up Visit Note   12/20/2022 Name: CASY TAVANO MRN: [id number] DOB: 10/21/1930  ARLENY KRUGER is a 89 y.o. year old female who sees Darrick Huntsman, Mar Daring, MD for primary care. I spoke with  [name] Swire's daughter by phone today.  What matters to the patients health and wellness today?  [name] confirmed receiving resources for private duty care. Patient will receive a call from [facility] on 11/6 to confirm bath aid visit for 11/7. [name] confirmed having in additional  community resource needs at this time   Goals Addressed             This Visit's Progress    care coordination activities       Activities and task to complete in order to accomplish goals.   LEVELS OF CARE TASK [facility] -bath aid scheduled on 11/7 , please expect a confirmation call on 12/21/22  CSW confirmed that patient has no additional community resource needs, please contact this Child psychotherapist with any additional community resource needs.         SDOH assessments and interventions completed:  Yes     Care Coordination Interventions:  Yes, provided  Interventions Today    Flowsheet Row Most Recent Value  Chronic Disease   Chronic disease during today's visit Congestive Heart Failure (CHF)  General Interventions   General Interventions Discussed/Reviewed General Interventions Reviewed, Doctor Visits, Level of Care, Communication with  [needs assessment completed-daughter confirmed that pt is currently receiving [facility] services, requesting bath aid private duty resources received]  Doctor Visits Discussed/Reviewed Doctor Visits Reviewed  [Cardiologist 11/6, PCP 11/7]  Communication with --  Buelah Manis, confirmed that bath aid is scheduled for 12/22/22-they will call pts daughter on 11/6 to confirm time,]  Level of Care Personal Care Services  Pharmacy Interventions   Pharmacy Dicussed/Reviewed Pharmacy Topics Reviewed       Follow up  plan: No further intervention required.   Encounter Outcome:  Patient Visit Completed

## 2022-12-20 NOTE — Telephone Encounter (Signed)
-----   Message from Sherlene Shams sent at 12/19/2022 11:40 PM EST ----- 1) Your labs indicate that you are NOT iron deficient so you should  stop the iron 2) Your kidney function is very low, ,and your potassium is high.  Stop the potassium supplement AND the lisinopril, and increase your water intake because you are dehydrated, as discussed during visit.   3) We will need to repeat your potassium on Thursday so please schedule a lab visit.  4) You should stop taking mirtazapine  because there is an interaction with amiodarone .  5)  I would like you to start sertraline for your anxiety and depression .  6) continue protonix the way you are taking it (20 mg twice daily)

## 2022-12-20 NOTE — Patient Instructions (Signed)
Visit Information  Thank you for taking time to visit with me today. Please don't hesitate to contact me if I can be of assistance to you.   Following are the goals we discussed today:   Goals Addressed             This Visit's Progress    care coordination activities       Activities and task to complete in order to accomplish goals.   LEVELS OF CARE TASK [facility] -bath aid scheduled on 11/7 , please expect a confirmation call on 12/21/22  CSW confirmed that patient has no additional community resource needs, please contact this Child psychotherapist with any additional community resource needs.         If you are experiencing a Mental Health or Behavioral Health Crisis or need someone to talk to, please call the Suicide and Crisis Lifeline: 988   Patient verbalizes understanding of instructions and care plan provided today and agrees to view in MyChart. Active MyChart status and patient understanding of how to access instructions and care plan via MyChart confirmed with patient.     No further follow up required: patient to contact this Child psychotherapist with any additional community resource needs    [name]  Value[facility]Based Care Institute, [facility] Licensed Clinical Social Geologist, engineering Dial: [phone]

## 2022-12-20 NOTE — Telephone Encounter (Signed)
See result note message 

## 2022-12-21 DIAGNOSIS — Z950 Presence of cardiac pacemaker: Secondary | ICD-10-CM | POA: Diagnosis not present

## 2022-12-21 DIAGNOSIS — E034 Atrophy of thyroid (acquired): Secondary | ICD-10-CM | POA: Diagnosis not present

## 2022-12-21 DIAGNOSIS — I495 Sick sinus syndrome: Secondary | ICD-10-CM | POA: Diagnosis not present

## 2022-12-21 DIAGNOSIS — I5023 Acute on chronic systolic (congestive) heart failure: Secondary | ICD-10-CM | POA: Diagnosis not present

## 2022-12-21 DIAGNOSIS — I480 Paroxysmal atrial fibrillation: Secondary | ICD-10-CM | POA: Diagnosis not present

## 2022-12-21 DIAGNOSIS — F411 Generalized anxiety disorder: Secondary | ICD-10-CM | POA: Diagnosis not present

## 2022-12-21 DIAGNOSIS — I130 Hypertensive heart and chronic kidney disease with heart failure and stage 1 through stage 4 chronic kidney disease, or unspecified chronic kidney disease: Secondary | ICD-10-CM | POA: Diagnosis not present

## 2022-12-21 DIAGNOSIS — N1832 Chronic kidney disease, stage 3b: Secondary | ICD-10-CM | POA: Diagnosis not present

## 2022-12-21 DIAGNOSIS — E785 Hyperlipidemia, unspecified: Secondary | ICD-10-CM | POA: Diagnosis not present

## 2022-12-21 DIAGNOSIS — I052 Rheumatic mitral stenosis with insufficiency: Secondary | ICD-10-CM | POA: Diagnosis not present

## 2022-12-22 ENCOUNTER — Other Ambulatory Visit: Payer: Self-pay | Admitting: Internal Medicine

## 2022-12-22 ENCOUNTER — Other Ambulatory Visit (INDEPENDENT_AMBULATORY_CARE_PROVIDER_SITE_OTHER): Payer: Medicare HMO

## 2022-12-22 DIAGNOSIS — E875 Hyperkalemia: Secondary | ICD-10-CM

## 2022-12-22 LAB — COMPREHENSIVE METABOLIC PANEL
ALT: 8 U/L (ref 0–35)
AST: 13 U/L (ref 0–37)
Albumin: 3.8 g/dL (ref 3.5–5.2)
Alkaline Phosphatase: 105 U/L (ref 39–117)
BUN: 19 mg/dL (ref 6–23)
CO2: 25 meq/L (ref 19–32)
Calcium: 9.5 mg/dL (ref 8.4–10.5)
Chloride: 103 meq/L (ref 96–112)
Creatinine, Ser: 1.6 mg/dL — ABNORMAL HIGH (ref 0.40–1.20)
GFR: 28.26 mL/min — ABNORMAL LOW (ref >60.00)
Glucose, Bld: 89 mg/dL (ref 70–99)
Potassium: 5.1 meq/L (ref 3.5–5.1)
Sodium: 138 meq/L (ref 135–145)
Total Bilirubin: 0.9 mg/dL (ref 0.2–1.2)
Total Protein: 6.9 g/dL (ref 6.0–8.3)

## 2022-12-23 DIAGNOSIS — N1832 Chronic kidney disease, stage 3b: Secondary | ICD-10-CM | POA: Diagnosis not present

## 2022-12-23 DIAGNOSIS — I052 Rheumatic mitral stenosis with insufficiency: Secondary | ICD-10-CM | POA: Diagnosis not present

## 2022-12-23 DIAGNOSIS — I495 Sick sinus syndrome: Secondary | ICD-10-CM | POA: Diagnosis not present

## 2022-12-23 DIAGNOSIS — I5023 Acute on chronic systolic (congestive) heart failure: Secondary | ICD-10-CM | POA: Diagnosis not present

## 2022-12-23 DIAGNOSIS — F411 Generalized anxiety disorder: Secondary | ICD-10-CM | POA: Diagnosis not present

## 2022-12-23 DIAGNOSIS — E785 Hyperlipidemia, unspecified: Secondary | ICD-10-CM | POA: Diagnosis not present

## 2022-12-23 DIAGNOSIS — E034 Atrophy of thyroid (acquired): Secondary | ICD-10-CM | POA: Diagnosis not present

## 2022-12-23 DIAGNOSIS — I480 Paroxysmal atrial fibrillation: Secondary | ICD-10-CM | POA: Diagnosis not present

## 2022-12-23 DIAGNOSIS — I130 Hypertensive heart and chronic kidney disease with heart failure and stage 1 through stage 4 chronic kidney disease, or unspecified chronic kidney disease: Secondary | ICD-10-CM | POA: Diagnosis not present

## 2022-12-26 ENCOUNTER — Telehealth: Payer: Self-pay

## 2022-12-26 DIAGNOSIS — F411 Generalized anxiety disorder: Secondary | ICD-10-CM | POA: Diagnosis not present

## 2022-12-26 DIAGNOSIS — I4510 Unspecified right bundle-branch block: Secondary | ICD-10-CM

## 2022-12-26 DIAGNOSIS — E785 Hyperlipidemia, unspecified: Secondary | ICD-10-CM | POA: Diagnosis not present

## 2022-12-26 DIAGNOSIS — N1832 Chronic kidney disease, stage 3b: Secondary | ICD-10-CM | POA: Diagnosis not present

## 2022-12-26 DIAGNOSIS — H3530 Unspecified macular degeneration: Secondary | ICD-10-CM

## 2022-12-26 DIAGNOSIS — I129 Hypertensive chronic kidney disease with stage 1 through stage 4 chronic kidney disease, or unspecified chronic kidney disease: Secondary | ICD-10-CM

## 2022-12-26 DIAGNOSIS — I5023 Acute on chronic systolic (congestive) heart failure: Secondary | ICD-10-CM | POA: Diagnosis not present

## 2022-12-26 DIAGNOSIS — E875 Hyperkalemia: Secondary | ICD-10-CM

## 2022-12-26 DIAGNOSIS — I444 Left anterior fascicular block: Secondary | ICD-10-CM

## 2022-12-26 DIAGNOSIS — I480 Paroxysmal atrial fibrillation: Secondary | ICD-10-CM | POA: Diagnosis not present

## 2022-12-26 DIAGNOSIS — I130 Hypertensive heart and chronic kidney disease with heart failure and stage 1 through stage 4 chronic kidney disease, or unspecified chronic kidney disease: Secondary | ICD-10-CM | POA: Diagnosis not present

## 2022-12-26 DIAGNOSIS — I495 Sick sinus syndrome: Secondary | ICD-10-CM | POA: Diagnosis not present

## 2022-12-26 DIAGNOSIS — E034 Atrophy of thyroid (acquired): Secondary | ICD-10-CM | POA: Diagnosis not present

## 2022-12-26 DIAGNOSIS — I052 Rheumatic mitral stenosis with insufficiency: Secondary | ICD-10-CM | POA: Diagnosis not present

## 2022-12-26 NOTE — Telephone Encounter (Signed)
[name], Gwendalyn Ege, called to states Dr. Duncan Dull wanted patient to see a kidney specialist.  [name] states patient would be willing to see a kidney specialist.    [name] states patient would like to be referred to [facility].  [name] states patient would be interested in Lamont Dowdy, MD or Mady Haagensen, MD.  Elita Quick states if patient needs to see a urologist, patient would like to see Vanna Scotland, MD, at [facility].

## 2022-12-26 NOTE — Telephone Encounter (Signed)
Pt's daughter Elita Quick notified.

## 2022-12-27 DIAGNOSIS — I480 Paroxysmal atrial fibrillation: Secondary | ICD-10-CM | POA: Diagnosis not present

## 2022-12-27 DIAGNOSIS — I130 Hypertensive heart and chronic kidney disease with heart failure and stage 1 through stage 4 chronic kidney disease, or unspecified chronic kidney disease: Secondary | ICD-10-CM | POA: Diagnosis not present

## 2022-12-27 DIAGNOSIS — E785 Hyperlipidemia, unspecified: Secondary | ICD-10-CM | POA: Diagnosis not present

## 2022-12-27 DIAGNOSIS — F411 Generalized anxiety disorder: Secondary | ICD-10-CM | POA: Diagnosis not present

## 2022-12-27 DIAGNOSIS — I5023 Acute on chronic systolic (congestive) heart failure: Secondary | ICD-10-CM | POA: Diagnosis not present

## 2022-12-27 DIAGNOSIS — I495 Sick sinus syndrome: Secondary | ICD-10-CM | POA: Diagnosis not present

## 2022-12-27 DIAGNOSIS — E034 Atrophy of thyroid (acquired): Secondary | ICD-10-CM | POA: Diagnosis not present

## 2022-12-27 DIAGNOSIS — N1832 Chronic kidney disease, stage 3b: Secondary | ICD-10-CM | POA: Diagnosis not present

## 2022-12-27 DIAGNOSIS — I052 Rheumatic mitral stenosis with insufficiency: Secondary | ICD-10-CM | POA: Diagnosis not present

## 2023-01-02 DIAGNOSIS — I5023 Acute on chronic systolic (congestive) heart failure: Secondary | ICD-10-CM | POA: Diagnosis not present

## 2023-01-02 DIAGNOSIS — N1832 Chronic kidney disease, stage 3b: Secondary | ICD-10-CM | POA: Diagnosis not present

## 2023-01-02 DIAGNOSIS — E785 Hyperlipidemia, unspecified: Secondary | ICD-10-CM | POA: Diagnosis not present

## 2023-01-02 DIAGNOSIS — I480 Paroxysmal atrial fibrillation: Secondary | ICD-10-CM | POA: Diagnosis not present

## 2023-01-02 DIAGNOSIS — I130 Hypertensive heart and chronic kidney disease with heart failure and stage 1 through stage 4 chronic kidney disease, or unspecified chronic kidney disease: Secondary | ICD-10-CM | POA: Diagnosis not present

## 2023-01-02 DIAGNOSIS — I052 Rheumatic mitral stenosis with insufficiency: Secondary | ICD-10-CM | POA: Diagnosis not present

## 2023-01-02 DIAGNOSIS — E034 Atrophy of thyroid (acquired): Secondary | ICD-10-CM | POA: Diagnosis not present

## 2023-01-02 DIAGNOSIS — F411 Generalized anxiety disorder: Secondary | ICD-10-CM | POA: Diagnosis not present

## 2023-01-02 DIAGNOSIS — I495 Sick sinus syndrome: Secondary | ICD-10-CM | POA: Diagnosis not present

## 2023-01-03 ENCOUNTER — Ambulatory Visit: Payer: Self-pay

## 2023-01-03 DIAGNOSIS — F411 Generalized anxiety disorder: Secondary | ICD-10-CM | POA: Diagnosis not present

## 2023-01-03 DIAGNOSIS — I052 Rheumatic mitral stenosis with insufficiency: Secondary | ICD-10-CM | POA: Diagnosis not present

## 2023-01-03 DIAGNOSIS — I495 Sick sinus syndrome: Secondary | ICD-10-CM | POA: Diagnosis not present

## 2023-01-03 DIAGNOSIS — I5023 Acute on chronic systolic (congestive) heart failure: Secondary | ICD-10-CM | POA: Diagnosis not present

## 2023-01-03 DIAGNOSIS — E785 Hyperlipidemia, unspecified: Secondary | ICD-10-CM | POA: Diagnosis not present

## 2023-01-03 DIAGNOSIS — I480 Paroxysmal atrial fibrillation: Secondary | ICD-10-CM | POA: Diagnosis not present

## 2023-01-03 DIAGNOSIS — I130 Hypertensive heart and chronic kidney disease with heart failure and stage 1 through stage 4 chronic kidney disease, or unspecified chronic kidney disease: Secondary | ICD-10-CM | POA: Diagnosis not present

## 2023-01-03 DIAGNOSIS — N1832 Chronic kidney disease, stage 3b: Secondary | ICD-10-CM | POA: Diagnosis not present

## 2023-01-03 DIAGNOSIS — E034 Atrophy of thyroid (acquired): Secondary | ICD-10-CM | POA: Diagnosis not present

## 2023-01-03 NOTE — Patient Outreach (Signed)
  Care Coordination   Follow Up Visit Note   01/03/2023 Name: BETSUA SILLIMAN MRN: [id number] DOB: 06-May-1930  MAKENZEE MATHE is a 89 y.o. year old female who sees Darrick Huntsman, Mar Daring, MD for primary care. I spoke with  Zadie Rhine by phone today.  What matters to the patients health and wellness today?  Daughter states patient was getting a little better until today.  She states patient wasn't feeling as good today. She states patients potassium level is up. Daughter states she is trying to find things patient can eat that will not be difficult for her to swallow. Daughter denies patient reporting any HF symptoms.  She states patient's weight is 111 lbs. Daughter states the home health bath aid has not started for patient. She reports patient still receiving home health skilled nursing and PT services    Goals Addressed             This Visit's Progress    Continued improvement post hospitalization and managment of health conditions       Interventions Today    Flowsheet Row Most Recent Value  Chronic Disease   Chronic disease during today's visit Congestive Heart Failure (CHF), Other  [generalized weakness]  General Interventions   General Interventions Discussed/Reviewed General Interventions Reviewed, Doctor Visits  [evaluation of current treatment plan for HF/ generalized weakness and patients adherence to plan as established by provider. Assessed for HF symptoms and ongoing generalized weakness.]  Doctor Visits Discussed/Reviewed Doctor Visits Reviewed  Education Interventions   Education Provided Provided Education  [Advised to continuing weighing daily. Reviewed HF symptoms. Advised to report mild symptoms to provider/ call 911 for severe symptoms. Confirmed patient still receiving home health services.]  Provided Verbal Education On Other  [Advised to call [facility] to determine start of care date for Home health aide.]  Nutrition Interventions   Nutrition  Discussed/Reviewed Nutrition Discussed, Supplemental nutrition  [Discussed low potassium food items.  Advised to use processer foods for easier swallowing.]  Pharmacy Interventions   Pharmacy Dicussed/Reviewed Pharmacy Topics Reviewed  Algis Downs to take medications as prescribed.]              SDOH assessments and interventions completed:  No     Care Coordination Interventions:  Yes, provided   Follow up plan: Follow up call scheduled for 02/03/23    Encounter Outcome:  Patient Visit Completed   George Ina RN,BSN,CCM [facility]  Value-Based Care Institute, [facility] coordinator / Case Manager Phone: [phone]

## 2023-01-03 NOTE — Patient Instructions (Signed)
Visit Information  Thank you for taking time to visit with me today. Please don't hesitate to contact me if I can be of assistance to you.   Following are the goals we discussed today:   Goals Addressed             This Visit's Progress    Continued improvement post hospitalization and managment of health conditions       Interventions Today    Flowsheet Row Most Recent Value  Chronic Disease   Chronic disease during today's visit Congestive Heart Failure (CHF), Other  [generalized weakness]  General Interventions   General Interventions Discussed/Reviewed General Interventions Reviewed, Doctor Visits  [evaluation of current treatment plan for HF/ generalized weakness and patients adherence to plan as established by provider. Assessed for HF symptoms and ongoing generalized weakness.]  Doctor Visits Discussed/Reviewed Doctor Visits Reviewed  Education Interventions   Education Provided Provided Education  [Advised to continuing weighing daily. Reviewed HF symptoms. Advised to report mild symptoms to provider/ call 911 for severe symptoms. Confirmed patient still receiving home health services.]  Provided Verbal Education On Other  [Advised to call [facility] to determine start of care date for Home health aide.]  Nutrition Interventions   Nutrition Discussed/Reviewed Nutrition Discussed, Supplemental nutrition  [Discussed low potassium food items.  Advised to use processer foods for easier swallowing.]  Pharmacy Interventions   Pharmacy Dicussed/Reviewed Pharmacy Topics Reviewed  Algis Downs to take medications as prescribed.]              Our next appointment is by telephone on 02/03/23 at 1:30 pm  Please call the care guide team at [phone] if you need to cancel or reschedule your appointment.   If you are experiencing a Mental Health or Behavioral Health Crisis or need someone to talk to, please call the Suicide and Crisis Lifeline: 988 call 1-800-273-TALK  (toll free, 24 hour hotline)  Patient verbalizes understanding of instructions and care plan provided today and agrees to view in MyChart. Active MyChart status and patient understanding of how to access instructions and care plan via MyChart confirmed with patient.     George Ina RN,BSN,CCM [place]  Value-Based Care Institute, [facility] coordinator / Case Manager Phone: [phone]

## 2023-01-04 DIAGNOSIS — I052 Rheumatic mitral stenosis with insufficiency: Secondary | ICD-10-CM | POA: Diagnosis not present

## 2023-01-04 DIAGNOSIS — E785 Hyperlipidemia, unspecified: Secondary | ICD-10-CM | POA: Diagnosis not present

## 2023-01-04 DIAGNOSIS — E034 Atrophy of thyroid (acquired): Secondary | ICD-10-CM | POA: Diagnosis not present

## 2023-01-04 DIAGNOSIS — I130 Hypertensive heart and chronic kidney disease with heart failure and stage 1 through stage 4 chronic kidney disease, or unspecified chronic kidney disease: Secondary | ICD-10-CM | POA: Diagnosis not present

## 2023-01-04 DIAGNOSIS — F411 Generalized anxiety disorder: Secondary | ICD-10-CM | POA: Diagnosis not present

## 2023-01-04 DIAGNOSIS — N1832 Chronic kidney disease, stage 3b: Secondary | ICD-10-CM | POA: Diagnosis not present

## 2023-01-04 DIAGNOSIS — I495 Sick sinus syndrome: Secondary | ICD-10-CM | POA: Diagnosis not present

## 2023-01-04 DIAGNOSIS — I480 Paroxysmal atrial fibrillation: Secondary | ICD-10-CM | POA: Diagnosis not present

## 2023-01-04 DIAGNOSIS — I5023 Acute on chronic systolic (congestive) heart failure: Secondary | ICD-10-CM | POA: Diagnosis not present

## 2023-01-05 DIAGNOSIS — I130 Hypertensive heart and chronic kidney disease with heart failure and stage 1 through stage 4 chronic kidney disease, or unspecified chronic kidney disease: Secondary | ICD-10-CM | POA: Diagnosis not present

## 2023-01-05 DIAGNOSIS — E785 Hyperlipidemia, unspecified: Secondary | ICD-10-CM | POA: Diagnosis not present

## 2023-01-05 DIAGNOSIS — N1832 Chronic kidney disease, stage 3b: Secondary | ICD-10-CM | POA: Diagnosis not present

## 2023-01-05 DIAGNOSIS — I495 Sick sinus syndrome: Secondary | ICD-10-CM | POA: Diagnosis not present

## 2023-01-05 DIAGNOSIS — E034 Atrophy of thyroid (acquired): Secondary | ICD-10-CM | POA: Diagnosis not present

## 2023-01-05 DIAGNOSIS — I480 Paroxysmal atrial fibrillation: Secondary | ICD-10-CM | POA: Diagnosis not present

## 2023-01-05 DIAGNOSIS — I052 Rheumatic mitral stenosis with insufficiency: Secondary | ICD-10-CM | POA: Diagnosis not present

## 2023-01-05 DIAGNOSIS — I5023 Acute on chronic systolic (congestive) heart failure: Secondary | ICD-10-CM | POA: Diagnosis not present

## 2023-01-05 DIAGNOSIS — F411 Generalized anxiety disorder: Secondary | ICD-10-CM | POA: Diagnosis not present

## 2023-01-06 ENCOUNTER — Encounter: Payer: Self-pay | Admitting: Internal Medicine

## 2023-01-06 ENCOUNTER — Telehealth: Payer: Self-pay | Admitting: Internal Medicine

## 2023-01-06 DIAGNOSIS — F5104 Psychophysiologic insomnia: Secondary | ICD-10-CM

## 2023-01-06 MED ORDER — TRAZODONE HCL 50 MG PO TABS: 25.0000 mg | ORAL_TABLET | Freq: Every evening | ORAL | 3 refills | Status: DC | PRN

## 2023-01-06 NOTE — Telephone Encounter (Signed)
Spoke with pt's daughter and she stated that since pt's sleeping medication was changed she has not been able to sleep. They are wanting to know if something different can be called in that will not interfere with her amiodarone.

## 2023-01-06 NOTE — Telephone Encounter (Signed)
Pt daughter would like to be called regarding the pt

## 2023-01-09 DIAGNOSIS — E034 Atrophy of thyroid (acquired): Secondary | ICD-10-CM | POA: Diagnosis not present

## 2023-01-09 DIAGNOSIS — N1832 Chronic kidney disease, stage 3b: Secondary | ICD-10-CM | POA: Diagnosis not present

## 2023-01-09 DIAGNOSIS — I130 Hypertensive heart and chronic kidney disease with heart failure and stage 1 through stage 4 chronic kidney disease, or unspecified chronic kidney disease: Secondary | ICD-10-CM | POA: Diagnosis not present

## 2023-01-09 DIAGNOSIS — I495 Sick sinus syndrome: Secondary | ICD-10-CM | POA: Diagnosis not present

## 2023-01-09 DIAGNOSIS — I480 Paroxysmal atrial fibrillation: Secondary | ICD-10-CM | POA: Diagnosis not present

## 2023-01-09 DIAGNOSIS — I052 Rheumatic mitral stenosis with insufficiency: Secondary | ICD-10-CM | POA: Diagnosis not present

## 2023-01-09 DIAGNOSIS — E785 Hyperlipidemia, unspecified: Secondary | ICD-10-CM | POA: Diagnosis not present

## 2023-01-09 DIAGNOSIS — F411 Generalized anxiety disorder: Secondary | ICD-10-CM | POA: Diagnosis not present

## 2023-01-09 DIAGNOSIS — I5023 Acute on chronic systolic (congestive) heart failure: Secondary | ICD-10-CM | POA: Diagnosis not present

## 2023-01-09 NOTE — Telephone Encounter (Signed)
Spoke with pt's daughter, Elita Quick, she stated that the pt stopped the Sertraline the day before starting the Trazodone. [name] also stated that pt has seen a big change already with 1/2 tablet. She stated that pt is sleeping much better and stated that her breathing is better.

## 2023-01-10 ENCOUNTER — Telehealth: Payer: Self-pay

## 2023-01-10 ENCOUNTER — Encounter: Payer: Self-pay | Admitting: Internal Medicine

## 2023-01-10 ENCOUNTER — Other Ambulatory Visit: Payer: Self-pay | Admitting: Internal Medicine

## 2023-01-10 DIAGNOSIS — H353212 Exudative age-related macular degeneration, right eye, with inactive choroidal neovascularization: Secondary | ICD-10-CM | POA: Diagnosis not present

## 2023-01-10 MED ORDER — ALPRAZOLAM 0.25 MG PO TABS: 0.2500 mg | ORAL_TABLET | Freq: Every day | ORAL | 0 refills | Status: DC | PRN

## 2023-01-10 NOTE — Telephone Encounter (Signed)
Pt's daughter, Elita Quick called and stated that pt started the Trazodone 1/2 tablet on Sunday night and she only got good sleep one night. She stated that pt also stated that she is feeling very anxious again. They are wanting to know if there is something else the she can try for the anxious feeling and if it okay to go ahead and increase the Trazodone to a whole tablet.

## 2023-01-11 DIAGNOSIS — I052 Rheumatic mitral stenosis with insufficiency: Secondary | ICD-10-CM | POA: Diagnosis not present

## 2023-01-11 DIAGNOSIS — N1832 Chronic kidney disease, stage 3b: Secondary | ICD-10-CM | POA: Diagnosis not present

## 2023-01-11 DIAGNOSIS — I480 Paroxysmal atrial fibrillation: Secondary | ICD-10-CM | POA: Diagnosis not present

## 2023-01-11 DIAGNOSIS — F411 Generalized anxiety disorder: Secondary | ICD-10-CM | POA: Diagnosis not present

## 2023-01-11 DIAGNOSIS — E785 Hyperlipidemia, unspecified: Secondary | ICD-10-CM | POA: Diagnosis not present

## 2023-01-11 DIAGNOSIS — I5023 Acute on chronic systolic (congestive) heart failure: Secondary | ICD-10-CM | POA: Diagnosis not present

## 2023-01-11 DIAGNOSIS — I495 Sick sinus syndrome: Secondary | ICD-10-CM | POA: Diagnosis not present

## 2023-01-11 DIAGNOSIS — E034 Atrophy of thyroid (acquired): Secondary | ICD-10-CM | POA: Diagnosis not present

## 2023-01-11 DIAGNOSIS — I130 Hypertensive heart and chronic kidney disease with heart failure and stage 1 through stage 4 chronic kidney disease, or unspecified chronic kidney disease: Secondary | ICD-10-CM | POA: Diagnosis not present

## 2023-01-17 ENCOUNTER — Telehealth: Payer: Self-pay | Admitting: Internal Medicine

## 2023-01-17 DIAGNOSIS — I052 Rheumatic mitral stenosis with insufficiency: Secondary | ICD-10-CM | POA: Diagnosis not present

## 2023-01-17 DIAGNOSIS — I3481 Nonrheumatic mitral (valve) annulus calcification: Secondary | ICD-10-CM | POA: Diagnosis not present

## 2023-01-17 DIAGNOSIS — F411 Generalized anxiety disorder: Secondary | ICD-10-CM | POA: Diagnosis not present

## 2023-01-17 DIAGNOSIS — J90 Pleural effusion, not elsewhere classified: Secondary | ICD-10-CM | POA: Diagnosis not present

## 2023-01-17 DIAGNOSIS — I5023 Acute on chronic systolic (congestive) heart failure: Secondary | ICD-10-CM | POA: Diagnosis not present

## 2023-01-17 DIAGNOSIS — I495 Sick sinus syndrome: Secondary | ICD-10-CM | POA: Diagnosis not present

## 2023-01-17 DIAGNOSIS — E034 Atrophy of thyroid (acquired): Secondary | ICD-10-CM | POA: Diagnosis not present

## 2023-01-17 DIAGNOSIS — E785 Hyperlipidemia, unspecified: Secondary | ICD-10-CM | POA: Diagnosis not present

## 2023-01-17 DIAGNOSIS — E782 Mixed hyperlipidemia: Secondary | ICD-10-CM | POA: Diagnosis not present

## 2023-01-17 DIAGNOSIS — I2699 Other pulmonary embolism without acute cor pulmonale: Secondary | ICD-10-CM | POA: Diagnosis not present

## 2023-01-17 DIAGNOSIS — I480 Paroxysmal atrial fibrillation: Secondary | ICD-10-CM | POA: Diagnosis not present

## 2023-01-17 DIAGNOSIS — R0989 Other specified symptoms and signs involving the circulatory and respiratory systems: Secondary | ICD-10-CM | POA: Diagnosis not present

## 2023-01-17 DIAGNOSIS — N1832 Chronic kidney disease, stage 3b: Secondary | ICD-10-CM | POA: Diagnosis not present

## 2023-01-17 DIAGNOSIS — R0602 Shortness of breath: Secondary | ICD-10-CM | POA: Diagnosis not present

## 2023-01-17 DIAGNOSIS — I130 Hypertensive heart and chronic kidney disease with heart failure and stage 1 through stage 4 chronic kidney disease, or unspecified chronic kidney disease: Secondary | ICD-10-CM | POA: Diagnosis not present

## 2023-01-17 NOTE — Telephone Encounter (Signed)
Refilled: 11/30/2022 Last OV: 12/19/2022 Next OV: not scheduled  Abnormal CMP on 12/22/2022.

## 2023-01-17 NOTE — Telephone Encounter (Signed)
Prescription Request  01/17/2023  LOV: 12/19/2022  What is the name of the medication or equipment? furosemide   Have you contacted your pharmacy to request a refill? No   Which pharmacy would you like this sent to? [facility]   Patient notified that their request is being sent to the clinical staff for review and that they should receive a response within 2 business days.   Please advise at Mobile [phone] (mobile)

## 2023-01-18 DIAGNOSIS — I480 Paroxysmal atrial fibrillation: Secondary | ICD-10-CM | POA: Diagnosis not present

## 2023-01-18 DIAGNOSIS — N1832 Chronic kidney disease, stage 3b: Secondary | ICD-10-CM | POA: Diagnosis not present

## 2023-01-18 DIAGNOSIS — I5023 Acute on chronic systolic (congestive) heart failure: Secondary | ICD-10-CM | POA: Diagnosis not present

## 2023-01-18 DIAGNOSIS — I052 Rheumatic mitral stenosis with insufficiency: Secondary | ICD-10-CM | POA: Diagnosis not present

## 2023-01-18 DIAGNOSIS — I130 Hypertensive heart and chronic kidney disease with heart failure and stage 1 through stage 4 chronic kidney disease, or unspecified chronic kidney disease: Secondary | ICD-10-CM | POA: Diagnosis not present

## 2023-01-18 DIAGNOSIS — E034 Atrophy of thyroid (acquired): Secondary | ICD-10-CM | POA: Diagnosis not present

## 2023-01-18 DIAGNOSIS — I495 Sick sinus syndrome: Secondary | ICD-10-CM | POA: Diagnosis not present

## 2023-01-18 DIAGNOSIS — F411 Generalized anxiety disorder: Secondary | ICD-10-CM | POA: Diagnosis not present

## 2023-01-18 DIAGNOSIS — E785 Hyperlipidemia, unspecified: Secondary | ICD-10-CM | POA: Diagnosis not present

## 2023-01-19 NOTE — Telephone Encounter (Signed)
Pt daughter called stating the pharmacy does not have the medication there.

## 2023-01-20 NOTE — Telephone Encounter (Signed)
Was sent in on 01/17/23. I have called it in again & left on voicemail.

## 2023-01-21 ENCOUNTER — Emergency Department
Admission: EM | Admit: 2023-01-21 | Discharge: 2023-01-21 | Disposition: A | Discharge status: Home / Self Care | Payer: Medicare HMO | Attending: Emergency Medicine | Admitting: Emergency Medicine

## 2023-01-21 ENCOUNTER — Encounter: Payer: Self-pay | Admitting: Radiology

## 2023-01-21 ENCOUNTER — Other Ambulatory Visit: Payer: Self-pay

## 2023-01-21 DIAGNOSIS — L0390 Cellulitis, unspecified: Secondary | ICD-10-CM

## 2023-01-21 DIAGNOSIS — L03116 Cellulitis of left lower limb: Secondary | ICD-10-CM | POA: Diagnosis not present

## 2023-01-21 DIAGNOSIS — M7989 Other specified soft tissue disorders: Secondary | ICD-10-CM | POA: Diagnosis present

## 2023-01-21 LAB — COMPREHENSIVE METABOLIC PANEL
ALT: 19 U/L (ref 0–44)
AST: 20 U/L (ref 15–41)
Albumin: 3.5 g/dL (ref 3.5–5.0)
Alkaline Phosphatase: 81 U/L (ref 38–126)
Anion gap: 10 (ref 5–15)
BUN: 10 mg/dL (ref 8–23)
CO2: 26 mmol/L (ref 22–32)
Calcium: 8.8 mg/dL — ABNORMAL LOW (ref 8.9–10.3)
Chloride: 103 mmol/L (ref 98–111)
Creatinine, Ser: 1.28 mg/dL — ABNORMAL HIGH (ref 0.44–1.00)
GFR, Estimated: 40 mL/min — ABNORMAL LOW (ref >60)
Glucose, Bld: 105 mg/dL — ABNORMAL HIGH (ref 70–99)
Potassium: 3.5 mmol/L (ref 3.5–5.1)
Sodium: 139 mmol/L (ref 135–145)
Total Bilirubin: 1.4 mg/dL — ABNORMAL HIGH (ref <1.2)
Total Protein: 6.6 g/dL (ref 6.5–8.1)

## 2023-01-21 LAB — CBC WITH DIFFERENTIAL/PLATELET
Abs Immature Granulocytes: 0.01 10*3/uL (ref 0.00–0.07)
Basophils Absolute: 0.1 10*3/uL (ref 0.0–0.1)
Basophils Relative: 2 %
Eosinophils Absolute: 0.1 10*3/uL (ref 0.0–0.5)
Eosinophils Relative: 1 %
HCT: 40.4 % (ref 36.0–46.0)
Hemoglobin: 12.7 g/dL (ref 12.0–15.0)
Immature Granulocytes: 0 %
Lymphocytes Relative: 24 %
Lymphs Abs: 1.3 10*3/uL (ref 0.7–4.0)
MCH: 29.7 pg (ref 26.0–34.0)
MCHC: 31.4 g/dL (ref 30.0–36.0)
MCV: 94.6 fL (ref 80.0–100.0)
Monocytes Absolute: 0.5 10*3/uL (ref 0.1–1.0)
Monocytes Relative: 11 %
Neutro Abs: 3.2 10*3/uL (ref 1.7–7.7)
Neutrophils Relative %: 62 %
Platelets: 251 10*3/uL (ref 150–400)
RBC: 4.27 MIL/uL (ref 3.87–5.11)
RDW: 16.6 % — ABNORMAL HIGH (ref 11.5–15.5)
WBC: 5.1 10*3/uL (ref 4.0–10.5)
nRBC: 0 % (ref 0.0–0.2)

## 2023-01-21 MED ORDER — CEPHALEXIN 500 MG PO CAPS: 500.0000 mg | ORAL_CAPSULE | Freq: Four times a day (QID) | ORAL | 0 refills | Status: AC

## 2023-01-21 NOTE — ED Provider Notes (Signed)
[facility] Provider Note    Event Date/Time   First MD Initiated Contact with Patient 01/21/23 1950     (approximate)   History   Leg Swelling   HPI  Frances Kent is a 89 y.o. female who presents to the emergency department today because of concerns for redness noted to her feet worse on the left foot.  The patient has had intermittent issues with some very mild swelling.  However he just noticed the redness today.  Patient did have some discomfort in that left foot.  Family also felt like it was warm and had fevers in it.  Otherwise the patient states she is in her normal state of health.  She denies any fevers or nausea or vomiting.     Physical Exam   Triage Vital Signs: ED Triage Vitals  Encounter Vitals Group     BP 01/21/23 1534 (!) 128/53     Systolic BP Percentile --      Diastolic BP Percentile --      Pulse Rate 01/21/23 1534 (!) 50     Resp 01/21/23 1534 17     Temp 01/21/23 1534 98.2 F (36.8 C)     Temp Source 01/21/23 1534 Oral     SpO2 01/21/23 1534 94 %     Weight 01/21/23 1532 113 lb (51.3 kg)     Height 01/21/23 1532 5\' 3"  (1.6 m)     Head Circumference --      Peak Flow --      Pain Score --      Pain Loc --      Pain Education --      Exclude from Growth Chart --     Most recent vital signs: Vitals:   01/21/23 1534  BP: (!) 128/53  Pulse: (!) 50  Resp: 17  Temp: 98.2 F (36.8 C)  SpO2: 94%   General: Awake, alert, oriented. CV:  Good peripheral perfusion.  Resp:  Normal effort.  Abd:  No distention.  Other:  No edema in bilateral lower extremities. DP palpable in left foot, dopplerable in right foot. Some erythema noted to right ankle.    ED Results / Procedures / Treatments   Labs (all labs ordered are listed, but only abnormal results are displayed) Labs Reviewed  CBC WITH DIFFERENTIAL/PLATELET - Abnormal; Notable for the following components:      Result Value   RDW 16.6 (*)    All other  components within normal limits  COMPREHENSIVE METABOLIC PANEL - Abnormal; Notable for the following components:   Glucose, Bld 105 (*)    Creatinine, Ser 1.28 (*)    Calcium 8.8 (*)    Total Bilirubin 1.4 (*)    GFR, Estimated 40 (*)    All other components within normal limits     EKG  None   RADIOLOGY None  PROCEDURES:  Critical Care performed: No    MEDICATIONS ORDERED IN ED: Medications - No data to display   IMPRESSION / MDM / ASSESSMENT AND PLAN / ED COURSE  I reviewed the triage vital signs and the nursing notes.                              Differential diagnosis includes, but is not limited to, blood clot, arterial occlusion, infection, fracture  Patient's presentation is most consistent with acute presentation with potential threat to life or bodily function.  Patient  presented to the emergency department today because of concerns for foot complaint.  On exam she does have some erythema primarily around the left leg.  No swelling to suggest DVT.  Patient does have arterial flow to both legs.  This time I think cellulitis likely.  Discussed this with the patient.  Will place patient on antibiotics.      FINAL CLINICAL IMPRESSION(S) / ED DIAGNOSES   Final diagnoses:  Cellulitis, unspecified cellulitis site     Note:  This document was prepared using Dragon voice recognition software and may include unintentional dictation errors.    Phineas Semen, MD 01/21/23 [phone]

## 2023-01-21 NOTE — ED Triage Notes (Signed)
Pt states that last week she started noticing swelling in her ankles and feet. She was started on lasixs but last night they began hurting in her legs and ankles. Pt ankles and feet are a bit red. No swelling noticed by this nurse.

## 2023-01-21 NOTE — Discharge Instructions (Addendum)
Please seek medical attention for any high fevers, chest pain, shortness of breath, change in behavior, persistent vomiting, bloody stool or any other new or concerning symptoms.  

## 2023-01-23 DIAGNOSIS — R0602 Shortness of breath: Secondary | ICD-10-CM | POA: Diagnosis not present

## 2023-01-23 DIAGNOSIS — I480 Paroxysmal atrial fibrillation: Secondary | ICD-10-CM | POA: Diagnosis not present

## 2023-01-24 DIAGNOSIS — I480 Paroxysmal atrial fibrillation: Secondary | ICD-10-CM | POA: Diagnosis not present

## 2023-01-24 DIAGNOSIS — I5023 Acute on chronic systolic (congestive) heart failure: Secondary | ICD-10-CM | POA: Diagnosis not present

## 2023-01-24 DIAGNOSIS — I130 Hypertensive heart and chronic kidney disease with heart failure and stage 1 through stage 4 chronic kidney disease, or unspecified chronic kidney disease: Secondary | ICD-10-CM | POA: Diagnosis not present

## 2023-01-24 DIAGNOSIS — N1832 Chronic kidney disease, stage 3b: Secondary | ICD-10-CM | POA: Diagnosis not present

## 2023-01-24 DIAGNOSIS — F411 Generalized anxiety disorder: Secondary | ICD-10-CM | POA: Diagnosis not present

## 2023-01-24 DIAGNOSIS — E034 Atrophy of thyroid (acquired): Secondary | ICD-10-CM | POA: Diagnosis not present

## 2023-01-24 DIAGNOSIS — I495 Sick sinus syndrome: Secondary | ICD-10-CM | POA: Diagnosis not present

## 2023-01-24 DIAGNOSIS — E785 Hyperlipidemia, unspecified: Secondary | ICD-10-CM | POA: Diagnosis not present

## 2023-01-24 DIAGNOSIS — I052 Rheumatic mitral stenosis with insufficiency: Secondary | ICD-10-CM | POA: Diagnosis not present

## 2023-01-25 DIAGNOSIS — I341 Nonrheumatic mitral (valve) prolapse: Secondary | ICD-10-CM | POA: Diagnosis not present

## 2023-01-25 DIAGNOSIS — Z23 Encounter for immunization: Secondary | ICD-10-CM | POA: Diagnosis not present

## 2023-01-25 DIAGNOSIS — I5020 Unspecified systolic (congestive) heart failure: Secondary | ICD-10-CM | POA: Diagnosis not present

## 2023-01-25 DIAGNOSIS — I480 Paroxysmal atrial fibrillation: Secondary | ICD-10-CM | POA: Diagnosis not present

## 2023-01-25 DIAGNOSIS — I4819 Other persistent atrial fibrillation: Secondary | ICD-10-CM | POA: Diagnosis not present

## 2023-01-25 DIAGNOSIS — I050 Rheumatic mitral stenosis: Secondary | ICD-10-CM | POA: Diagnosis not present

## 2023-01-25 DIAGNOSIS — Z86711 Personal history of pulmonary embolism: Secondary | ICD-10-CM | POA: Diagnosis not present

## 2023-01-25 DIAGNOSIS — I10 Essential (primary) hypertension: Secondary | ICD-10-CM | POA: Diagnosis not present

## 2023-01-25 DIAGNOSIS — I4892 Unspecified atrial flutter: Secondary | ICD-10-CM | POA: Diagnosis not present

## 2023-01-26 DIAGNOSIS — I129 Hypertensive chronic kidney disease with stage 1 through stage 4 chronic kidney disease, or unspecified chronic kidney disease: Secondary | ICD-10-CM | POA: Diagnosis not present

## 2023-01-26 DIAGNOSIS — E785 Hyperlipidemia, unspecified: Secondary | ICD-10-CM | POA: Diagnosis not present

## 2023-01-26 DIAGNOSIS — I509 Heart failure, unspecified: Secondary | ICD-10-CM | POA: Diagnosis not present

## 2023-01-26 DIAGNOSIS — N1832 Chronic kidney disease, stage 3b: Secondary | ICD-10-CM | POA: Diagnosis not present

## 2023-01-26 DIAGNOSIS — R8290 Unspecified abnormal findings in urine: Secondary | ICD-10-CM | POA: Diagnosis not present

## 2023-01-26 DIAGNOSIS — E875 Hyperkalemia: Secondary | ICD-10-CM | POA: Diagnosis not present

## 2023-01-31 DIAGNOSIS — I5023 Acute on chronic systolic (congestive) heart failure: Secondary | ICD-10-CM | POA: Diagnosis not present

## 2023-01-31 DIAGNOSIS — F411 Generalized anxiety disorder: Secondary | ICD-10-CM | POA: Diagnosis not present

## 2023-01-31 DIAGNOSIS — I495 Sick sinus syndrome: Secondary | ICD-10-CM | POA: Diagnosis not present

## 2023-01-31 DIAGNOSIS — I052 Rheumatic mitral stenosis with insufficiency: Secondary | ICD-10-CM | POA: Diagnosis not present

## 2023-01-31 DIAGNOSIS — I480 Paroxysmal atrial fibrillation: Secondary | ICD-10-CM | POA: Diagnosis not present

## 2023-01-31 DIAGNOSIS — N1832 Chronic kidney disease, stage 3b: Secondary | ICD-10-CM | POA: Diagnosis not present

## 2023-01-31 DIAGNOSIS — I130 Hypertensive heart and chronic kidney disease with heart failure and stage 1 through stage 4 chronic kidney disease, or unspecified chronic kidney disease: Secondary | ICD-10-CM | POA: Diagnosis not present

## 2023-01-31 DIAGNOSIS — E034 Atrophy of thyroid (acquired): Secondary | ICD-10-CM | POA: Diagnosis not present

## 2023-01-31 DIAGNOSIS — E785 Hyperlipidemia, unspecified: Secondary | ICD-10-CM | POA: Diagnosis not present

## 2023-02-01 DIAGNOSIS — I130 Hypertensive heart and chronic kidney disease with heart failure and stage 1 through stage 4 chronic kidney disease, or unspecified chronic kidney disease: Secondary | ICD-10-CM | POA: Diagnosis not present

## 2023-02-01 DIAGNOSIS — E034 Atrophy of thyroid (acquired): Secondary | ICD-10-CM | POA: Diagnosis not present

## 2023-02-01 DIAGNOSIS — N1832 Chronic kidney disease, stage 3b: Secondary | ICD-10-CM | POA: Diagnosis not present

## 2023-02-01 DIAGNOSIS — I495 Sick sinus syndrome: Secondary | ICD-10-CM | POA: Diagnosis not present

## 2023-02-01 DIAGNOSIS — F411 Generalized anxiety disorder: Secondary | ICD-10-CM | POA: Diagnosis not present

## 2023-02-01 DIAGNOSIS — I052 Rheumatic mitral stenosis with insufficiency: Secondary | ICD-10-CM | POA: Diagnosis not present

## 2023-02-01 DIAGNOSIS — E785 Hyperlipidemia, unspecified: Secondary | ICD-10-CM | POA: Diagnosis not present

## 2023-02-01 DIAGNOSIS — I5023 Acute on chronic systolic (congestive) heart failure: Secondary | ICD-10-CM | POA: Diagnosis not present

## 2023-02-01 DIAGNOSIS — I480 Paroxysmal atrial fibrillation: Secondary | ICD-10-CM | POA: Diagnosis not present

## 2023-02-02 DIAGNOSIS — I480 Paroxysmal atrial fibrillation: Secondary | ICD-10-CM | POA: Diagnosis not present

## 2023-02-02 DIAGNOSIS — I130 Hypertensive heart and chronic kidney disease with heart failure and stage 1 through stage 4 chronic kidney disease, or unspecified chronic kidney disease: Secondary | ICD-10-CM | POA: Diagnosis not present

## 2023-02-02 DIAGNOSIS — F411 Generalized anxiety disorder: Secondary | ICD-10-CM | POA: Diagnosis not present

## 2023-02-02 DIAGNOSIS — I495 Sick sinus syndrome: Secondary | ICD-10-CM | POA: Diagnosis not present

## 2023-02-02 DIAGNOSIS — N1832 Chronic kidney disease, stage 3b: Secondary | ICD-10-CM | POA: Diagnosis not present

## 2023-02-02 DIAGNOSIS — E785 Hyperlipidemia, unspecified: Secondary | ICD-10-CM | POA: Diagnosis not present

## 2023-02-02 DIAGNOSIS — I052 Rheumatic mitral stenosis with insufficiency: Secondary | ICD-10-CM | POA: Diagnosis not present

## 2023-02-02 DIAGNOSIS — E034 Atrophy of thyroid (acquired): Secondary | ICD-10-CM | POA: Diagnosis not present

## 2023-02-02 DIAGNOSIS — I5023 Acute on chronic systolic (congestive) heart failure: Secondary | ICD-10-CM | POA: Diagnosis not present

## 2023-02-03 ENCOUNTER — Ambulatory Visit: Payer: Self-pay

## 2023-02-03 ENCOUNTER — Ambulatory Visit: Payer: Medicare HMO | Admitting: Internal Medicine

## 2023-02-03 NOTE — Patient Outreach (Signed)
  Care Coordination   Follow Up Visit Note   02/03/2023 Name: Frances Kent MRN: [id number] DOB: 04/10/1930  Frances Kent is a 89 y.o. year old female who sees Darrick Huntsman, Mar Daring, MD for primary care. I  spoke with patients daughter, Frances Kent today.   What matters to the patients health and wellness today?  Daughter states patient is doing much better. She states patient continues to received home health PT services. She states patient seems to be responding well with the PT.  Daughter states patient seen in ED on 01/21/23 for left foot cellulitis. She states patients foot has responded well to the antibiotics which she has completed. Denies any additional redness, swelling or pain reported by patient. Daughter states patient is taking her medications as prescribed and adhering to medication adjustments. Daughter states patient had to miss her primary care provider appointment today because daughter was sick and unable to take her.  Daughter states appointment with primary care provider has been rescheduled.    Goals Addressed             This Visit's Progress    Continued improvement post hospitalization and managment of health conditions       Interventions Today    Flowsheet Row Most Recent Value  Chronic Disease   Chronic disease during today's visit Congestive Heart Failure (CHF), Other  [generalized weakness, cellulitis left foot]  General Interventions   General Interventions Discussed/Reviewed General Interventions Reviewed, Doctor Visits  [evaluation of current treatmetn plan for mentioned health conditions and patients adherence to plan as established by provider. Assessed for HF and/ or ongoing cellulitis symptoms and generalized weakness.]  Doctor Visits Discussed/Reviewed Doctor Visits Reviewed  [Discussed recent nephrology/ cardiology visits. Reviewed upcoming provider visits. Advised to keep appointments with provider as recommended.   Confirmed patients primary care  provider visit was rescheduled.]  Education Interventions   Education Provided Provided Education  [Discussed causes of cellulitis. Advised to keep feet moisturized, non slip shoes/ slippers to protect feet.]  Provided Verbal Education On Other  [Inquired if patient still receiving home health services.]  Pharmacy Interventions   Pharmacy Dicussed/Reviewed Pharmacy Topics Reviewed  [confirmed patient fully completed antibiotic as recommended by provider.  Reviewed medications and adjustments. Advised to take all medications as prescribed.]              SDOH assessments and interventions completed:  No     Care Coordination Interventions:  Yes, provided   Follow up plan: Follow up call scheduled for 03/15/23    Encounter Outcome:  Patient Visit Completed   George Ina RN,BSN,CCM [facility]  Value-Based Care Institute, [facility] coordinator / Case Manager Phone: [phone]

## 2023-02-03 NOTE — Patient Instructions (Signed)
Visit Information  Thank you for taking time to visit with me today. Please don't hesitate to contact me if I can be of assistance to you.   Following are the goals we discussed today:   Goals Addressed             This Visit's Progress    Continued improvement post hospitalization and managment of health conditions       Interventions Today    Flowsheet Row Most Recent Value  Chronic Disease   Chronic disease during today's visit Congestive Heart Failure (CHF), Other  [generalized weakness, cellulitis left foot]  General Interventions   General Interventions Discussed/Reviewed General Interventions Reviewed, Doctor Visits  [evaluation of current treatmetn plan for mentioned health conditions and patients adherence to plan as established by provider. Assessed for HF and/ or ongoing cellulitis symptoms and generalized weakness.]  Doctor Visits Discussed/Reviewed Doctor Visits Reviewed  [Discussed recent nephrology/ cardiology visits. Reviewed upcoming provider visits. Advised to keep appointments with provider as recommended.   Confirmed patients primary care provider visit was rescheduled.]  Education Interventions   Education Provided Provided Education  [Discussed causes of cellulitis. Advised to keep feet moisturized, non slip shoes/ slippers to protect feet.]  Provided Verbal Education On Other  [Inquired if patient still receiving home health services.]  Pharmacy Interventions   Pharmacy Dicussed/Reviewed Pharmacy Topics Reviewed  [confirmed patient fully completed antibiotic as recommended by provider.  Reviewed medications and adjustments. Advised to take all medications as prescribed.]              Our next appointment is by telephone on 03/15/23 at 2:30 pm  Please call the care guide team at [phone] if you need to cancel or reschedule your appointment.   If you are experiencing a Mental Health or Behavioral Health Crisis or need someone to talk to, please call the  Suicide and Crisis Lifeline: 988 call 1-800-273-TALK (toll free, 24 hour hotline)  Patient verbalizes understanding of instructions and care plan provided today and agrees to view in MyChart. Active MyChart status and patient understanding of how to access instructions and care plan via MyChart confirmed with patient.     George Ina RN,BSN,CCM [place]  Value[facility]Based [facility] Institute, [facility] coordinator / Case Manager Phone: [phone]

## 2023-02-09 ENCOUNTER — Encounter: Payer: Self-pay | Admitting: Nurse Practitioner

## 2023-02-09 ENCOUNTER — Ambulatory Visit (INDEPENDENT_AMBULATORY_CARE_PROVIDER_SITE_OTHER): Payer: Medicare HMO | Admitting: Nurse Practitioner

## 2023-02-09 ENCOUNTER — Other Ambulatory Visit: Payer: Self-pay | Admitting: Internal Medicine

## 2023-02-09 VITALS — BP 118/78 | HR 62 | Temp 97.6°F | Ht 63.0 in | Wt 115.6 lb

## 2023-02-09 DIAGNOSIS — R5383 Other fatigue: Secondary | ICD-10-CM

## 2023-02-09 DIAGNOSIS — R600 Localized edema: Secondary | ICD-10-CM

## 2023-02-09 DIAGNOSIS — G4700 Insomnia, unspecified: Secondary | ICD-10-CM | POA: Diagnosis not present

## 2023-02-09 DIAGNOSIS — R221 Localized swelling, mass and lump, neck: Secondary | ICD-10-CM | POA: Diagnosis not present

## 2023-02-09 NOTE — Progress Notes (Signed)
Established Patient Office Visit  Subjective:  Patient ID: Frances Kent, female    DOB: [birth year]  Age: 89 y.o. MRN: [id number]  CC:  Chief Complaint  Patient presents with   Acute Visit    Loss of Appetite Leg weakness SOB  Feels like a lump in her throat Trouble sleeping    Discussed the use of a AI scribe  software for clinical note transcription with the patient, who gave verbal consent to proceed.  HPI  Frances Kent presents for acute visit accompanied with her daughter. Pt has sensation of a non tender and mobile lump in her throat.  She has been able to eat soft foods such as dumplings, mashed potatoes and macaroni and cheese, but struggles with harder foods. She denies any diarrhea, constipation, or blood in her urine or feces. She reports a loss of appetite and a general feeling of malaise since restarting metoprolol after a brief period of non-compliance due to a lapse in prescription. The patient's heart rate and oxygen levels have been stable, but she reports occasional shortness of breath, for which she is taking breathing treatments.  The patient also reports weakness in her stomach and legs, which she believes may be related to the metoprolol. Marland Kitchen  Her bowel movements have been regular. The patient has been experiencing poor sleep, often waking up after only two hours and staying awake for the rest of the night. She has been taking trazodone for sleep, but reports that it has not been effective. She does manage to nap during the day due to fatigue.  The patient also reports a hacking cough, which she believes may be related to her lungs. She has noticed some puffiness in her foot.Trace e She has been taking furosemide daily for fluid retention and has been keeping her feet elevated.   HPI   Past Medical History:  Diagnosis Date   Arthritis    Atrial fibrillation (HCC)    Benign breast cyst in female, left 10/07/2016   Candida esophagitis (HCC) 05/03/2021    Found on Mar 10 2021 EGD    CAP (community acquired pneumonia) 02/15/2021   CHF (congestive heart failure) (HCC)    Colon adenomas    GERD (gastroesophageal reflux disease)    HCAP (healthcare-associated pneumonia) 02/02/2021   Hypertension    Hypothyroidism    Macular degeneration    Mitral regurgitation    Pulmonary embolism (HCC) 02/2016   Severe sepsis (HCC) 02/02/2021   Stroke (HCC) 10/2020   SVT (supraventricular tachycardia) (HCC)     Past Surgical History:  Procedure Laterality Date   APPENDECTOMY  1960   BREAST CYST ASPIRATION Left 2017   CARDIOVERSION N/A 04/05/2016   Procedure: Cardioversion;  Surgeon: Lamar Blinks, MD;  Location: [facility];  Service: Cardiovascular;  Laterality: N/A;   CHOLECYSTECTOMY  1985   ESOPHAGOGASTRODUODENOSCOPY N/A 03/10/2021   Procedure: ESOPHAGOGASTRODUODENOSCOPY (EGD);  Surgeon: Toledo, Boykin Nearing, MD;  Location: [facility];  Service: Gastroenterology;  Laterality: N/A;   ESOPHAGOGASTRODUODENOSCOPY (EGD) WITH PROPOFOL N/A 11/27/2019   Procedure: ESOPHAGOGASTRODUODENOSCOPY (EGD) WITH PROPOFOL;  Surgeon: Regis Bill, MD;  Location: [facility];  Service: Endoscopy;  Laterality: N/A;   ESOPHAGOGASTRODUODENOSCOPY (EGD) WITH PROPOFOL N/A 12/24/2019   Procedure: ESOPHAGOGASTRODUODENOSCOPY (EGD) WITH PROPOFOL;  Surgeon: Regis Bill, MD;  Location: [facility];  Service: Endoscopy;  Laterality: N/A;   IR CT HEAD LTD  11/01/2020   IR PERCUTANEOUS ART THROMBECTOMY/INFUSION INTRACRANIAL INC DIAG ANGIO  11/01/2020   IR US GUIDE  VASC ACCESS RIGHT  11/02/2020   OVARIAN CYST REMOVAL     PACEMAKER LEADLESS INSERTION N/A 11/16/2021   Procedure: PACEMAKER LEADLESS INSERTION;  Surgeon: Marcina Millard, MD;  Location: [facility];  Service: Cardiovascular;  Laterality: N/A;   RADIOLOGY WITH ANESTHESIA N/A 11/01/2020   Procedure: RADIOLOGY WITH ANESTHESIA;  Surgeon: Julieanne Cotton, MD;  Location: [facility];  Service:  Radiology;  Laterality: N/A;   TEE WITHOUT CARDIOVERSION N/A 02/01/2018   Procedure: TRANSESOPHAGEAL ECHOCARDIOGRAM (TEE);  Surgeon: Lamar Blinks, MD;  Location: [facility];  Service: Cardiovascular;  Laterality: N/A;    Family History  Problem Relation Age of Onset   Cancer Mother 59       breast cancer, lived to 68,    Breast cancer Mother 65   Cancer Son 35       pancreatic cancer   Heart disease Son        CAD, Tobacco Abuse    Heart disease Maternal Grandmother 34       died of massive MI   Stroke Sister     Social History   Socioeconomic History   Marital status: Widowed    Spouse name: Not on file   Number of children: Not on file   Years of education: Not on file   Highest education level: 10th grade  Occupational History   Not on file  Tobacco Use   Smoking status: Never   Smokeless tobacco: Never   Tobacco comments:    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 , worked at [employer], Animator status: Never Used  Substance and Sexual Activity   Alcohol use: No   Drug use: No   Sexual activity: Not Currently  Other Topics Concern   Not on file  Social History Narrative   Has caretaking responsibility for 3 young grandchildren who live with her   Social Drivers of Corporate investment banker Strain: Low Risk  (01/27/2023)   Overall Financial Resource Strain (CARDIA)    Difficulty of Paying Living Expenses: Not hard at all  Food Insecurity: No Food Insecurity (01/27/2023)   Hunger Vital Sign    Worried About Running Out of Food in the Last Year: Never true    Ran Out of Food in the Last Year: Never true  Transportation Needs: No Transportation Needs (01/27/2023)   PRAPARE - Administrator, Civil Service (Medical): No    Lack of Transportation (Non-Medical): No  Physical Activity: Unknown (01/27/2023)   Exercise Vital Sign    Days of Exercise per Week: 0 days    Minutes of Exercise per Session: Not on file  Stress: Stress Concern Present  (01/27/2023)   [employer] of Occupational Health - Occupational Stress Questionnaire    Feeling of Stress : Very much  Social Connections: Socially Isolated (01/27/2023)   Social Connection and Isolation Panel [NHANES]    Frequency of Communication with Friends and Family: More than three times a week    Frequency of Social Gatherings with Friends and Family: Never    Attends Religious Services: Never    Database administrator or Organizations: No    Attends Banker Meetings: Never    Marital Status: Widowed  Intimate Partner Violence: Not At Risk (11/27/2022)   Humiliation, Afraid, Rape, and Kick questionnaire    Fear of Current or Ex-Partner: No    Emotionally Abused: No    Physically Abused: No    Sexually Abused: No  Outpatient Medications Prior to Visit  Medication Sig Dispense Refill   acetaminophen (TYLENOL) 500 MG tablet Take 500 mg by mouth every 6 (six) hours as needed.     ALPRAZolam (XANAX) 0.25 MG tablet Take 1 tablet (0.25 mg total) by mouth daily as needed for anxiety. 30 tablet 0   furosemide (LASIX) 20 MG tablet TAKE 1 TABLET BY MOUTH EVERY OTHER DAY 45 tablet 1   levalbuterol (XOPENEX) 0.63 MG/3ML nebulizer solution USE 1  VIAL IN NEBULIZER EVERY 4 HOURS AS NEEDED FOR WHEEZING AND FOR SHORTNESS OF BREATH 150 mL 5   levothyroxine (SYNTHROID) 50 MCG tablet TAKE 1 TABLET BY MOUTH ONCE DAILY BEFORE BREAKFAST 90 tablet 0   metoprolol succinate (TOPROL-XL) 25 MG 24 hr tablet Take 1 tablet (25 mg total) by mouth 2 (two) times daily. 60 tablet 1   potassium chloride SA (KLOR-CON M) 20 MEQ tablet Take 20 mEq by mouth every other day.     Probiotic CHEW Chew 2 tablets by mouth daily.     traZODone (DESYREL) 50 MG tablet Take 0.5-1 tablets (25-50 mg total) by mouth at bedtime as needed for sleep. 30 tablet 3   XARELTO 15 MG TABS tablet TAKE 1 TABLET EVERY DAY 90 tablet 3   amiodarone (PACERONE) 200 MG tablet Take 0.5 tablets (100 mg total) by mouth daily.  Home med. (Patient not taking: Reported on 02/03/2023)     benzonatate (TESSALON) 200 MG capsule Take 1 capsule (200 mg total) by mouth 3 (three) times daily as needed for cough. (Patient not taking: Reported on 01/03/2023) 60 capsule 0   guaiFENesin (MUCINEX) 600 MG 12 hr tablet Take 1 tablet (600 mg total) by mouth 2 (two) times daily as needed for cough. 30 tablet 1   No facility-administered medications prior to visit.    Allergies  Allergen Reactions   Statins     Severe myalgias    ROS Review of Systems Negative unless indicated in HPI.    Objective:    Physical Exam Constitutional:      Appearance: Normal appearance.  HENT:     Head: Normocephalic.     Mouth/Throat:     Mouth: Mucous membranes are moist.     Palate: No mass.     Pharynx: No oropharyngeal exudate.  Neck:     Thyroid: No thyroid mass or thyroid tenderness.  Cardiovascular:     Rate and Rhythm: Normal rate and regular rhythm.     Pulses: Normal pulses.     Heart sounds: Normal heart sounds.  Pulmonary:     Effort: Pulmonary effort is normal.     Breath sounds: Normal breath sounds. No stridor. No wheezing.  Musculoskeletal:        General: Normal range of motion.     Cervical back: Normal range of motion. No rigidity or tenderness.     Right lower leg: Edema (LLE> RLE) present.     Left lower leg: Edema present.  Skin:    General: Skin is warm.  Neurological:     General: No focal deficit present.     Mental Status: She is alert. Mental status is at baseline.  Psychiatric:        Mood and Affect: Mood normal.        Behavior: Behavior normal.        Thought Content: Thought content normal.     BP 118/78   Pulse 62   Temp 97.6 F (36.4 C)   Ht 5\' 3"  (  1.6 m)   Wt 115 lb 9.6 oz (52.4 kg)   SpO2 98%   BMI 20.48 kg/m  Wt Readings from Last 3 Encounters:  02/09/23 115 lb 9.6 oz (52.4 kg)  01/21/23 113 lb (51.3 kg)  12/19/22 113 lb 3.2 oz (51.3 kg)     Health Maintenance  Topic  Date Due   DTaP/Tdap/Td (1 - Tdap) Never done   Medicare Annual Wellness (AWV)  06/12/2022   INFLUENZA VACCINE  05/15/2023 (Originally 09/15/2022)   Pneumonia Vaccine 8+ Years old  Completed   DEXA SCAN  Completed   HPV VACCINES  Aged Out   COVID-19 Vaccine  Discontinued   Zoster Vaccines- Shingrix  Discontinued    There are no preventive care reminders to display for this patient.  Lab Results  Component Value Date   TSH 4.39 02/09/2023   Lab Results  Component Value Date   WBC 5.2 02/09/2023   HGB 12.5 02/09/2023   HCT 38.7 02/09/2023   MCV 91.8 02/09/2023   PLT 243.0 02/09/2023   Lab Results  Component Value Date   NA 139 01/21/2023   K 3.5 01/21/2023   CO2 26 01/21/2023   GLUCOSE 105 (H) 01/21/2023   BUN 10 01/21/2023   CREATININE 1.28 (H) 01/21/2023   BILITOT 1.4 (H) 01/21/2023   ALKPHOS 81 01/21/2023   AST 20 01/21/2023   ALT 19 01/21/2023   PROT 6.6 01/21/2023   ALBUMIN 3.5 01/21/2023   CALCIUM 8.8 (L) 01/21/2023   ANIONGAP 10 01/21/2023   GFR 28.26 (L) 12/22/2022   Lab Results  Component Value Date   CHOL 257 (H) 11/23/2021   Lab Results  Component Value Date   HDL 96.40 11/23/2021   Lab Results  Component Value Date   LDLCALC 140 (H) 11/23/2021   Lab Results  Component Value Date   TRIG 104.0 11/23/2021   Lab Results  Component Value Date   CHOLHDL 3 11/23/2021   Lab Results  Component Value Date   HGBA1C 5.8 (H) 11/02/2020      Assessment & Plan:  Other fatigue Assessment & Plan: Will check labs.  Orders: -     CBC -     TSH -     Vitamin B12 -     VITAMIN D 25 Hydroxy (Vit-D Deficiency, Fractures)  Insomnia, unspecified type Assessment & Plan: Chronic unstable. Reports poor sleep quality despite taking trazodone. Sleeps for approximately 2 hours at night, with occasional daytime napping. -Consider alternative sleep aids or sleep hygiene discussed.   Bilateral lower extremity edema Assessment & Plan: Trace edema  L>R Continue furosemide 20mg  daily and elevate legs to reduce swelling.   Lump in neck Assessment & Plan: Reports feeling a lump in throat. Able to swallow soft foods without difficulty. No associated pain. Will check TSH level, Would consider EGD/ [place]  if no improvement.      Follow-up: Return if symptoms worsen or fail to improve.   Kara Dies, NP

## 2023-02-10 ENCOUNTER — Telehealth: Payer: Self-pay

## 2023-02-10 LAB — CBC
HCT: 38.7 % (ref 36.0–46.0)
Hemoglobin: 12.5 g/dL (ref 12.0–15.0)
MCHC: 32.4 g/dL (ref 30.0–36.0)
MCV: 91.8 fL (ref 78.0–100.0)
Platelets: 243 10*3/uL (ref 150.0–400.0)
RBC: 4.21 Mil/uL (ref 3.87–5.11)
RDW: 17.5 % — ABNORMAL HIGH (ref 11.5–15.5)
WBC: 5.2 10*3/uL (ref 4.0–10.5)

## 2023-02-10 LAB — VITAMIN D 25 HYDROXY (VIT D DEFICIENCY, FRACTURES): VITD: 44.94 ng/mL (ref 30.00–100.00)

## 2023-02-10 LAB — VITAMIN B12: Vitamin B-12: 904 pg/mL (ref 211–911)

## 2023-02-10 LAB — TSH: TSH: 4.39 u[IU]/mL (ref 0.35–5.50)

## 2023-02-10 NOTE — Telephone Encounter (Signed)
The labs from tomorrow are stable. No anemia. She does not have to start iron supplement.

## 2023-02-10 NOTE — Telephone Encounter (Signed)
Copied from CRM [id number]. Topic: Clinical - Medical Advice >> Feb 10, 2023  1:07 PM Elizebeth Brooking wrote: Reason for CRM: Patient daughter wanted to know if she should start the patient on Iron pills and if so how much. She is asking for a callback

## 2023-02-10 NOTE — Telephone Encounter (Signed)
Called and spoke to daughter who is on [place]. She understood and would call if they have any questions.

## 2023-02-12 DIAGNOSIS — R221 Localized swelling, mass and lump, neck: Secondary | ICD-10-CM | POA: Insufficient documentation

## 2023-02-12 DIAGNOSIS — R600 Localized edema: Secondary | ICD-10-CM | POA: Insufficient documentation

## 2023-02-12 NOTE — Assessment & Plan Note (Signed)
Will check labs

## 2023-02-12 NOTE — Assessment & Plan Note (Signed)
Trace edema L>R Continue furosemide 20mg  daily and elevate legs to reduce swelling.

## 2023-02-12 NOTE — Assessment & Plan Note (Signed)
Chronic unstable. Reports poor sleep quality despite taking trazodone. Sleeps for approximately 2 hours at night, with occasional daytime napping. -Consider alternative sleep aids or sleep hygiene discussed.

## 2023-02-12 NOTE — Assessment & Plan Note (Signed)
Reports feeling a lump in throat. Able to swallow soft foods without difficulty. No associated pain. Will check TSH level, Would consider EGD/ [place]  if no improvement.

## 2023-02-16 DIAGNOSIS — I495 Sick sinus syndrome: Secondary | ICD-10-CM | POA: Diagnosis not present

## 2023-02-16 DIAGNOSIS — F411 Generalized anxiety disorder: Secondary | ICD-10-CM | POA: Diagnosis not present

## 2023-02-16 DIAGNOSIS — E785 Hyperlipidemia, unspecified: Secondary | ICD-10-CM | POA: Diagnosis not present

## 2023-02-16 DIAGNOSIS — I444 Left anterior fascicular block: Secondary | ICD-10-CM

## 2023-02-16 DIAGNOSIS — I130 Hypertensive heart and chronic kidney disease with heart failure and stage 1 through stage 4 chronic kidney disease, or unspecified chronic kidney disease: Secondary | ICD-10-CM | POA: Diagnosis not present

## 2023-02-16 DIAGNOSIS — I480 Paroxysmal atrial fibrillation: Secondary | ICD-10-CM | POA: Diagnosis not present

## 2023-02-16 DIAGNOSIS — E034 Atrophy of thyroid (acquired): Secondary | ICD-10-CM | POA: Diagnosis not present

## 2023-02-16 DIAGNOSIS — I5023 Acute on chronic systolic (congestive) heart failure: Secondary | ICD-10-CM | POA: Diagnosis not present

## 2023-02-16 DIAGNOSIS — H3530 Unspecified macular degeneration: Secondary | ICD-10-CM

## 2023-02-16 DIAGNOSIS — N1832 Chronic kidney disease, stage 3b: Secondary | ICD-10-CM | POA: Diagnosis not present

## 2023-02-16 DIAGNOSIS — I4510 Unspecified right bundle-branch block: Secondary | ICD-10-CM

## 2023-02-16 DIAGNOSIS — I052 Rheumatic mitral stenosis with insufficiency: Secondary | ICD-10-CM | POA: Diagnosis not present

## 2023-02-23 ENCOUNTER — Ambulatory Visit (INDEPENDENT_AMBULATORY_CARE_PROVIDER_SITE_OTHER): Payer: Medicare HMO | Admitting: Internal Medicine

## 2023-02-23 ENCOUNTER — Encounter: Payer: Self-pay | Admitting: Internal Medicine

## 2023-02-23 VITALS — BP 138/66 | HR 51 | Ht 63.0 in | Wt 114.0 lb

## 2023-02-23 DIAGNOSIS — I4891 Unspecified atrial fibrillation: Secondary | ICD-10-CM | POA: Diagnosis not present

## 2023-02-23 DIAGNOSIS — N1832 Chronic kidney disease, stage 3b: Secondary | ICD-10-CM

## 2023-02-23 DIAGNOSIS — R5382 Chronic fatigue, unspecified: Secondary | ICD-10-CM

## 2023-02-23 DIAGNOSIS — Z7189 Other specified counseling: Secondary | ICD-10-CM | POA: Diagnosis not present

## 2023-02-23 DIAGNOSIS — R1319 Other dysphagia: Secondary | ICD-10-CM | POA: Diagnosis not present

## 2023-02-23 MED ORDER — LEVOTHYROXINE SODIUM 50 MCG PO TABS: 50.0000 ug | ORAL_TABLET | Freq: Every day | ORAL | 0 refills | Status: DC

## 2023-02-23 NOTE — Progress Notes (Signed)
 Subjective:  Patient ID: Frances Kent, female    DOB: [birth year]  Age: 89 y.o. MRN: [id number]  CC: The primary encounter diagnosis was Atrial fibrillation status post cardioversion [facility]). Diagnoses of Esophageal dysphagia, Chronic kidney disease, stage 3b (HCC), Chronic fatigue, and Counseling regarding goals of care were also pertinent to this visit.   HPI Frances Kent presents for  Chief Complaint  Patient presents with   Medical Management of Chronic Issues   Follow up on chronic malaise, fatigue attributed to metoprolol .  Seen by [name] on dec 26 .  Labs done.   1) mild swelling of left foot since treatment for  cellulitis in December with c.ephalexin for one week  redness has resolved,  there was o open wound  2) my stomach is weak  Denies nausea and abd pain. Dysphagia for solids and liquids   3)  bilateral lelg weakness .  Frances Kent  Says PT don;t do no good  per daugher she did not do the exercises on her own.  .  . 3) insomnia : states that she still is not sleeping despite taking trazodone  ,  but naps during the day . Often gets only 2.5 hours of sleep   4) PAF:  states that hew new cardiologist stoppedthe amiodarone  in November odr December,  prior to ECHO.  Still taking toprol  25 mg bid   Outpatient Medications Prior to Visit  Medication Sig Dispense Refill   acetaminophen  (TYLENOL ) 500 MG tablet Take 500 mg by mouth every 6 (six) hours as needed.     furosemide  (LASIX ) 20 MG tablet TAKE 1 TABLET BY MOUTH EVERY OTHER DAY 45 tablet 1   levalbuterol  (XOPENEX ) 0.63 MG/3ML nebulizer solution USE 1  VIAL IN NEBULIZER EVERY 4 HOURS AS NEEDED FOR WHEEZING AND FOR SHORTNESS OF BREATH 150 mL 5   metoprolol  succinate (TOPROL -XL) 25 MG 24 hr tablet Take 1 tablet (25 mg total) by mouth 2 (two) times daily. 60 tablet 1   Probiotic CHEW Chew 2 tablets by mouth daily.     traZODone  (DESYREL ) 50 MG tablet Take 0.5-1 tablets (25-50 mg total) by mouth at bedtime as needed for sleep. 30  tablet 3   XARELTO  15 MG TABS tablet TAKE 1 TABLET EVERY DAY 90 tablet 3   levothyroxine  (SYNTHROID ) 50 MCG tablet TAKE 1 TABLET BY MOUTH ONCE DAILY BEFORE BREAKFAST 90 tablet 0   ALPRAZolam  (XANAX ) 0.25 MG tablet Take 1 tablet (0.25 mg total) by mouth daily as needed for anxiety. 30 tablet 0   amiodarone  (PACERONE ) 200 MG tablet Take 0.5 tablets (100 mg total) by mouth daily. Home med. (Patient not taking: Reported on 02/03/2023)     potassium chloride  SA (KLOR-CON  M) 20 MEQ tablet Take 20 mEq by mouth every other day.     No facility-administered medications prior to visit.    Review of Systems;  Patient denies headache, fevers, malaise, unintentional weight loss, skin rash, eye pain, sinus congestion and sinus pain, sore throat, dysphagia,  hemoptysis , cough, dyspnea, wheezing, chest pain, palpitations, orthopnea, edema, abdominal pain, nausea, melena, diarrhea, constipation, flank pain, dysuria, hematuria, urinary  Frequency, nocturia, numbness, tingling, seizures,  Focal weakness, Loss of consciousness,  Tremor, insomnia, depression, anxiety, and suicidal ideation.      Objective:  BP 138/66   Pulse (!) 51   Ht 5' 3 (1.6 m)   Wt 114 lb (51.7 kg)   SpO2 95%   BMI 20.19 kg/m   BP Readings from Last  3 Encounters:  02/23/23 138/66  02/09/23 118/78  01/21/23 (!) 159/69    Wt Readings from Last 3 Encounters:  02/23/23 114 lb (51.7 kg)  02/09/23 115 lb 9.6 oz (52.4 kg)  01/21/23 113 lb (51.3 kg)    Physical Exam Vitals reviewed.  Constitutional:      General: She is not in acute distress.    Appearance: Normal appearance. She is normal weight. She is not ill-appearing, toxic-appearing or diaphoretic.  HENT:     Head: Normocephalic.  Eyes:     General: No scleral icterus.       Right eye: No discharge.        Left eye: No discharge.     Conjunctiva/sclera: Conjunctivae normal.  Cardiovascular:     Rate and Rhythm: Normal rate and regular rhythm.     Heart sounds:  Normal heart sounds.  Pulmonary:     Effort: Pulmonary effort is normal. No respiratory distress.     Breath sounds: Normal breath sounds.  Musculoskeletal:        General: Normal range of motion.  Skin:    General: Skin is warm and dry.  Neurological:     General: No focal deficit present.     Mental Status: She is alert and oriented to person, place, and time. Mental status is at baseline.  Psychiatric:        Mood and Affect: Mood normal.        Behavior: Behavior normal.        Thought Content: Thought content normal.        Judgment: Judgment normal.   Lab Results  Component Value Date   HGBA1C 5.8 (H) 11/02/2020   HGBA1C 6.1 (H) 10/16/2020    Lab Results  Component Value Date   CREATININE 1.28 (H) 01/21/2023   CREATININE 1.60 (H) 12/22/2022   CREATININE 1.55 (H) 12/19/2022    Lab Results  Component Value Date   WBC 5.2 02/09/2023   HGB 12.5 02/09/2023   HCT 38.7 02/09/2023   PLT 243.0 02/09/2023   GLUCOSE 105 (H) 01/21/2023   CHOL 257 (H) 11/23/2021   TRIG 104.0 11/23/2021   HDL 96.40 11/23/2021   LDLDIRECT 185.3 03/27/2013   LDLCALC 140 (H) 11/23/2021   ALT 19 01/21/2023   AST 20 01/21/2023   NA 139 01/21/2023   K 3.5 01/21/2023   CL 103 01/21/2023   CREATININE 1.28 (H) 01/21/2023   BUN 10 01/21/2023   CO2 26 01/21/2023   TSH 4.39 02/09/2023   INR 3.0 (H) 10/15/2020   HGBA1C 5.8 (H) 11/02/2020    No results found.  Assessment & Plan:  .Atrial fibrillation status post cardioversion [facility]) Assessment & Plan: S/p amiodarone  load during hospitalization,  amiodarone  was discontinued  per Dec 3 cardiology OV n.  She has a pacemaker, but still  low 50's and she attributes her symptoms to .  Metoprolol  at 25 mg bid .  Weill reduce dose to 12.5 mg bid    Esophageal dysphagia Assessment & Plan: Sending for DG esophagus to rule out mass/stricture  Orders: -     DG ESOPHAGUS W SINGLE CM (SOL OR THIN BA); Future  Chronic kidney disease, stage 3b  (HCC) Assessment & Plan: Stable by repeat labs  Normal lytes.  Lisinopril  also stopped   Lab Results  Component Value Date   CREATININE 1.28 (H) 01/21/2023   Lab Results  Component Value Date   NA 139 01/21/2023   K 3.5 01/21/2023   CL  103 01/21/2023   CO2 26 01/21/2023      Chronic fatigue Assessment & Plan: Chronic, Secondary to severe conditioning  encouragedto  continue PT exercises s  at home    Counseling regarding goals of care  Other orders -     Levothyroxine  Sodium; Take 1 tablet (50 mcg total) by mouth daily before breakfast.  Dispense: 90 tablet; Refill: 0     I provided 44 minutes of face-to-face time during this encounter reviewing patient's last visit with me, patient's  last several visits with cardiology,    recent surgical and non surgical procedures, previous  labs and imaging studies, counseling on currently addressed issues,  and post visit ordering to diagnostics and therapeutics .   Follow-up: Return in about 4 weeks (around 03/23/2023).   Verneita LITTIE Kettering, MD

## 2023-02-23 NOTE — Patient Instructions (Addendum)
 You can increase the trazodone  (your sleeping pill ) to 1 full tablet ,  1.5   or  increase  to 2 tablets  if needed   You can take the medication 1 hour before bedtime since it is slow  Napping for more than 30 minutes during the day will  disrupt the sleep study   I have ordered the swallow evaluation to rule out a stricture    You need to do the physical therapy exercises several times EVERY DAY if you want to get stronger   Reduce  the metoprolol   dose to 1/2 tablet twice daily.  If your heart rate goes above 100, increase the dose again to 25 mg twice daily

## 2023-02-23 NOTE — Assessment & Plan Note (Signed)
 Chronic, Secondary to severe conditioning  encouragedto  continue PT exercises s  at home

## 2023-02-23 NOTE — Assessment & Plan Note (Addendum)
 S/p amiodarone load during hospitalization,  amiodarone was discontinued  per Dec 3 cardiology OV n.  She has a pacemaker, but still  low 50's and she attributes her symptoms to .  Metoprolol at 25 mg bid .  Weill reduce dose to 12.5 mg bid

## 2023-02-23 NOTE — Assessment & Plan Note (Signed)
 Sending for DG esophagus to rule out mass/stricture

## 2023-02-23 NOTE — Assessment & Plan Note (Signed)
 Stable by repeat labs  Normal lytes.  Lisinopril also stopped   Lab Results  Component Value Date   CREATININE 1.28 (H) 01/21/2023   Lab Results  Component Value Date   NA 139 01/21/2023   K 3.5 01/21/2023   CL 103 01/21/2023   CO2 26 01/21/2023

## 2023-03-09 ENCOUNTER — Other Ambulatory Visit: Payer: Self-pay | Admitting: Internal Medicine

## 2023-03-13 ENCOUNTER — Ambulatory Visit: Payer: Self-pay | Admitting: Internal Medicine

## 2023-03-13 NOTE — Telephone Encounter (Addendum)
Copied from CRM [id number]. Topic: Clinical - Red Word Triage >> Mar 13, 2023 12:50 PM Frances Kent wrote: [facility] that prompted transfer to Nurse Triage: patients daughter  pam on the line, patient is having extreme swelling in her feet and ankles, feet are purplish in color and the right foot is red on top. Patient has a dry unproductive cough, gained a few lbs of water weight and is scared she is retaining fluid.    Chief Complaint: Bilateral feet and ankle swelling Symptoms: swelling in feet and ankles, redness on right foot, cough Pertinent Negatives: Patient denies chest pain, SOB Disposition: [] ED /[] Urgent Care (no appt availability in office) / [x] Appointment(In office/virtual)/ []  [facility] [place] [facility]/ [] Home Care/ [] Refused Recommended Disposition /[] [facility] [facility]/ []  Follow-up with PCP  Additional Notes: [name] Elita Quick called and stated that patient has swelling in both feet/ankles. She stated she had swelling in the left for a while, but she noticed that both feet are swollen a couple days ago. There is also some redness on the right foot. She was having pain a couple nights ago, but none today. Patient also has a cough. There are no appointments available with PCP. [name] was agreeable to schedule appt with different provider on 1/28 , but she prefers Dr. Darrick Huntsman. She asks to be notified if there are any cancellations for Dr.Tullo today or tomorrow.  Reason for Disposition  MILD or MODERATE ankle swelling (e.g., can't move joint normally, can't do usual activities) (Exceptions: Itchy, localized swelling; swelling is chronic.)  Answer Assessment - Initial Assessment Questions 1. LOCATION: "Which ankle is swollen?" "Where is the swelling?"     Both feed ankle ankles 2. ONSET: "When did the swelling start?"     *No Answer* 3. SWELLING: "How bad is the swelling?" Or, "How large is it?" (e.g., mild, moderate, severe; size of localized swelling)    - NONE: No  joint swelling.   - LOCALIZED: Localized; small area of puffy or swollen skin (e.g., insect bite, skin irritation).   - MILD: Joint looks or feels mildly swollen or puffy.   - MODERATE: Swollen; interferes with normal activities (e.g., work or school); decreased range of movement; may be limping.   - SEVERE: Very swollen; can't move swollen joint at all; limping a lot or unable to walk.     Able to walk, trying to keep fee propped up  4. PAIN: "Is there any pain?" If Yes, ask: "How bad is it?" (Scale 1-10; or mild, moderate, severe)   - NONE (0): no pain.   - MILD (1-3): doesn't interfere with normal activities.    - MODERATE (4-7): interferes with normal activities (e.g., work or school) or awakens from sleep, limping.    - SEVERE (8-10): excruciating pain, unable to do any normal activities, unable to walk.      No pain   5. CAUSE: "What do you think caused the ankle swelling?"     *No Answer* 6. OTHER SYMPTOMS: "Do you have any other symptoms?" (e.g., fever, chest pain, difficulty breathing, calf pain)     A cough like when she had  Protocols used: Ankle Swelling-A-AH

## 2023-03-14 ENCOUNTER — Ambulatory Visit (INDEPENDENT_AMBULATORY_CARE_PROVIDER_SITE_OTHER): Payer: Medicare HMO

## 2023-03-14 ENCOUNTER — Ambulatory Visit (INDEPENDENT_AMBULATORY_CARE_PROVIDER_SITE_OTHER): Payer: Medicare HMO | Admitting: Nurse Practitioner

## 2023-03-14 ENCOUNTER — Other Ambulatory Visit: Payer: Self-pay | Admitting: Nurse Practitioner

## 2023-03-14 ENCOUNTER — Ambulatory Visit: Payer: Medicare HMO | Admitting: Family Medicine

## 2023-03-14 VITALS — BP 110/72 | HR 53 | Temp 97.4°F | Ht 63.0 in | Wt 117.0 lb

## 2023-03-14 DIAGNOSIS — R600 Localized edema: Secondary | ICD-10-CM | POA: Diagnosis not present

## 2023-03-14 DIAGNOSIS — R0602 Shortness of breath: Secondary | ICD-10-CM | POA: Diagnosis not present

## 2023-03-14 DIAGNOSIS — R059 Cough, unspecified: Secondary | ICD-10-CM

## 2023-03-14 DIAGNOSIS — J90 Pleural effusion, not elsewhere classified: Secondary | ICD-10-CM | POA: Diagnosis not present

## 2023-03-14 DIAGNOSIS — R0989 Other specified symptoms and signs involving the circulatory and respiratory systems: Secondary | ICD-10-CM | POA: Diagnosis not present

## 2023-03-14 DIAGNOSIS — I5022 Chronic systolic (congestive) heart failure: Secondary | ICD-10-CM

## 2023-03-14 NOTE — Assessment & Plan Note (Signed)
Likely due to CHF exacerbation. Will check chest x-ray and lab work. Strict precautions given to patient.

## 2023-03-14 NOTE — Progress Notes (Signed)
[name], NP-C Phone: [phone]  Frances Kent is a 89 y.o. female who presents today for edema.   Discussed the use of AI scribe software for clinical note transcription with the patient, who gave verbal consent to proceed.  History of Present Illness   The patient, with congestive heart failure, presents with worsening bilateral lower extremity edema and shortness of breath.   She has been experiencing swelling in her legs, particularly in her feet, which has persisted despite taking Lasix. The swelling is more pronounced in the left leg compared to the right. She has been using compression socks, although they cause discomfort due to tightness. She elevates her legs using a lift chair and consistently uses her walker. She denies pain in her legs but reports a feeling of tightness.   She has a history of congestive heart failure and has been experiencing a cough and increased shortness of breath. The shortness of breath is more pronounced today than usual, although it does not last long. She is currently taking Lasix and has taken two doses today, resulting in increased urination. She usually takes one dose but was advised to take two due to the swelling in her feet. She is also on Xarelto due to a history of clots.      Social History   Tobacco Use  Smoking Status Never  Smokeless Tobacco Never  Tobacco Comments   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 , worked at [employer], [employer]    Current Outpatient Medications on File Prior to Visit  Medication Sig Dispense Refill   acetaminophen (TYLENOL) 500 MG tablet Take 500 mg by mouth every 6 (six) hours as needed.     furosemide (LASIX) 20 MG tablet TAKE 1 TABLET BY MOUTH EVERY OTHER DAY 45 tablet 1   levalbuterol (XOPENEX) 0.63 MG/3ML nebulizer solution USE 1  VIAL IN NEBULIZER EVERY 4 HOURS AS NEEDED FOR WHEEZING AND FOR SHORTNESS OF BREATH 150 mL 5   levothyroxine (SYNTHROID) 50 MCG tablet Take 1 tablet (50 mcg total) by mouth daily before  breakfast. 90 tablet 0   metoprolol succinate (TOPROL-XL) 25 MG 24 hr tablet Take 1 tablet (25 mg total) by mouth 2 (two) times daily. 60 tablet 1   Probiotic CHEW Chew 2 tablets by mouth daily.     traZODone (DESYREL) 50 MG tablet Take 0.5-1 tablets (25-50 mg total) by mouth at bedtime as needed for sleep. 30 tablet 3   XARELTO 15 MG TABS tablet TAKE 1 TABLET EVERY DAY 90 tablet 3   No current facility-administered medications on file prior to visit.    ROS see history of present illness  Objective  Physical Exam Vitals:   03/14/23 1551  BP: 110/72  Pulse: (!) 53  Temp: (!) 97.4 F (36.3 C)  SpO2: 96%    BP Readings from Last 3 Encounters:  03/14/23 110/72  02/23/23 138/66  02/09/23 118/78   Wt Readings from Last 3 Encounters:  03/14/23 117 lb (53.1 kg)  02/23/23 114 lb (51.7 kg)  02/09/23 115 lb 9.6 oz (52.4 kg)    Physical Exam Constitutional:      General: She is not in acute distress.    Appearance: Normal appearance.  HENT:     Head: Normocephalic.  Cardiovascular:     Rate and Rhythm: Normal rate and regular rhythm.     Heart sounds: Normal heart sounds.  Pulmonary:     Effort: Pulmonary effort is normal. No respiratory distress.     Breath sounds: Normal breath  sounds. No wheezing.  Musculoskeletal:     Right lower leg: 1+ Pitting Edema present.     Left lower leg: 2+ Pitting Edema present.     Comments: LLE > RLE. Edema present from top of foot to knee on left side. Right side with ankle and foot edema only. No erythema or warmth present.   Skin:    General: Skin is warm and dry.  Neurological:     General: No focal deficit present.     Mental Status: She is alert.  Psychiatric:        Mood and Affect: Mood normal.        Behavior: Behavior normal.    Assessment/Plan: Please see individual problem list.  Chronic HFrEF (heart failure with reduced ejection fraction) (HCC) Assessment & Plan: Lower extremity edema has increased, especially in the  left leg. Shortness of breath is reported, but lung examination reveals no audible fluid. Vitals stable. Weight gain of 3 pounds over the last 3 weeks. She will continue Lasix 20mg  at once daily x 5 days. Order lab work as outlined. Order a chest x-ray to check for pulmonary edema. Order an ultrasound of the left leg to rule out deep vein thrombosis, given the history of clots and current anticoagulation with Xarelto. Encourage the use of compression socks and leg elevation to manage edema. Strict precautions given to patient, advised if worsening shortness of breath to seek immediate medical attention.   Orders: -     CBC with Differential/Platelet -     DG Chest 2 View; Future -     Brain natriuretic peptide  Bilateral lower extremity edema Assessment & Plan: See CHF plan- likely due to CHF exacerbation.   Orders: -     TSH -     Comprehensive metabolic panel -     US Venous Img Lower Unilateral Left (DVT); Future  Shortness of breath Assessment & Plan: Likely due to CHF exacerbation. Will check chest x-ray and lab work. Strict precautions given to patient.   Orders: -     DG Chest 2 View; Future   Return if symptoms worsen or fail to improve.   [name], NP-C [facility]

## 2023-03-14 NOTE — Assessment & Plan Note (Addendum)
See CHF plan- likely due to CHF exacerbation.

## 2023-03-14 NOTE — Assessment & Plan Note (Addendum)
Lower extremity edema has increased, especially in the left leg. Shortness of breath is reported, but lung examination reveals no audible fluid. Vitals stable. Weight gain of 3 pounds over the last 3 weeks. She will continue Lasix 20mg  at once daily x 5 days. Order lab work as outlined. Order a chest x-ray to check for pulmonary edema. Order an ultrasound of the left leg to rule out deep vein thrombosis, given the history of clots and current anticoagulation with Xarelto. Encourage the use of compression socks and leg elevation to manage edema. Strict precautions given to patient, advised if worsening shortness of breath to seek immediate medical attention.

## 2023-03-15 ENCOUNTER — Telehealth: Payer: Self-pay | Admitting: *Deleted

## 2023-03-15 ENCOUNTER — Ambulatory Visit: Payer: Self-pay

## 2023-03-15 ENCOUNTER — Ambulatory Visit
Admission: RE | Admit: 2023-03-15 | Discharge: 2023-03-15 | Disposition: A | Discharge status: Home / Self Care | Payer: Medicare HMO | Source: Ambulatory Visit | Attending: Nurse Practitioner | Admitting: Nurse Practitioner

## 2023-03-15 DIAGNOSIS — I82512 Chronic embolism and thrombosis of left femoral vein: Secondary | ICD-10-CM | POA: Diagnosis not present

## 2023-03-15 DIAGNOSIS — R600 Localized edema: Secondary | ICD-10-CM | POA: Insufficient documentation

## 2023-03-15 LAB — COMPREHENSIVE METABOLIC PANEL
ALT: 16 U/L (ref 0–35)
AST: 23 U/L (ref 0–37)
Albumin: 4.2 g/dL (ref 3.5–5.2)
Alkaline Phosphatase: 82 U/L (ref 39–117)
BUN: 8 mg/dL (ref 6–23)
CO2: 30 meq/L (ref 19–32)
Calcium: 9.4 mg/dL (ref 8.4–10.5)
Chloride: 101 meq/L (ref 96–112)
Creatinine, Ser: 1.11 mg/dL (ref 0.40–1.20)
GFR: 43.75 mL/min — ABNORMAL LOW (ref >60.00)
Glucose, Bld: 99 mg/dL (ref 70–99)
Potassium: 3.9 meq/L (ref 3.5–5.1)
Sodium: 139 meq/L (ref 135–145)
Total Bilirubin: 0.7 mg/dL (ref 0.2–1.2)
Total Protein: 6.8 g/dL (ref 6.0–8.3)

## 2023-03-15 LAB — CBC WITH DIFFERENTIAL/PLATELET
Basophils Absolute: 0.1 10*3/uL (ref 0.0–0.1)
Basophils Relative: 1.6 % (ref 0.0–3.0)
Eosinophils Absolute: 0.1 10*3/uL (ref 0.0–0.7)
Eosinophils Relative: 1.1 % (ref 0.0–5.0)
HCT: 39.6 % (ref 36.0–46.0)
Hemoglobin: 12.7 g/dL (ref 12.0–15.0)
Lymphocytes Relative: 25.4 % (ref 12.0–46.0)
Lymphs Abs: 1.2 10*3/uL (ref 0.7–4.0)
MCHC: 32 g/dL (ref 30.0–36.0)
MCV: 91.5 fL (ref 78.0–100.0)
Monocytes Absolute: 0.5 10*3/uL (ref 0.1–1.0)
Monocytes Relative: 10.7 % (ref 3.0–12.0)
Neutro Abs: 2.9 10*3/uL (ref 1.4–7.7)
Neutrophils Relative %: 61.2 % (ref 43.0–77.0)
Platelets: 260 10*3/uL (ref 150.0–400.0)
RBC: 4.33 Mil/uL (ref 3.87–5.11)
RDW: 17.9 % — ABNORMAL HIGH (ref 11.5–15.5)
WBC: 4.7 10*3/uL (ref 4.0–10.5)

## 2023-03-15 LAB — TSH: TSH: 5.67 u[IU]/mL — ABNORMAL HIGH (ref 0.35–5.50)

## 2023-03-15 LAB — BRAIN NATRIURETIC PEPTIDE: Brain Natriuretic Peptide: 1370 pg/mL — ABNORMAL HIGH (ref <100)

## 2023-03-15 NOTE — Telephone Encounter (Signed)
Copied from CRM [id number]. Topic: Clinical - Lab/Test Results >> Mar 15, 2023 11:30 AM Steele Sizer wrote: Reason for CRM: Pt daughter Elita Quick stated that she would like a callback to discuss the pt lab results.

## 2023-03-15 NOTE — Patient Outreach (Signed)
  Care Coordination   Follow Up Visit Note   03/15/2023 Name: Frances Kent MRN: [id number] DOB: 1930-10-08  Frances Kent is a 89 y.o. year old female who sees Darrick Huntsman, Mar Daring, MD for primary care. I  spoke with Windell Hummingbird  What matters to the patients health and wellness today?  Daughter states patient is not doing very well. She states patients legs are very swollen with right leg being worse along with having a cough.  She states she is waiting to hear from patients primary care provider for patient to have an ultrasound today to rule out blood clot.  Daughter reports she spoke with the provider office this morning. Daughter states patients amiodarone was discontinued.  Daughter reports patient having recent follow up visit with nephrologist. She states patients kidney function was stable and potassium level came down. Daughter states patient was advised no follow up was needed with nephrologist unless issues arise.    Daughter had to end call due to provider office calling to say stat ultrasound order was placed and waiting for patient arrival.     Goals Addressed             This Visit's Progress    Continued improvement post hospitalization and managment of health conditions       Interventions Today    Flowsheet Row Most Recent Value  Chronic Disease   Chronic disease during today's visit Congestive Heart Failure (CHF), Other  [Bilateral LE swelling]  General Interventions   General Interventions Discussed/Reviewed General Interventions Reviewed, Doctor Visits, Labs  [place] of current treatment plan for listed health conditions and patients adherence to plan as established by provider. Assessed for HF symptoms]  Labs Kidney Function  [kidney and potassium lab results discussed]  Doctor Visits Discussed/Reviewed Doctor Visits Reviewed  Annabell Sabal upcoming provider visits .Advised to keep follow up visit. Discussed recent nephrology visit]  Education Interventions    Education Provided Provided Education  [Advised to contact provider for ongoing symptoms. Confirmed patient adherening to provider recommended  action plan for HF management]  Pharmacy Interventions   Pharmacy Dicussed/Reviewed Pharmacy Topics Reviewed  [medication list reviewed. discussed medication adjustments. Advised to take medications as prescribed.]              SDOH assessments and interventions completed:  No     Care Coordination Interventions:  No, not indicated   Follow up plan: Follow up call scheduled for 03/28/23 at 10 am    Encounter Outcome:  Patient Visit Completed George Ina RN, BSN, CCM [place]  [facility], Population Health Case Manager Phone: [phone]

## 2023-03-15 NOTE — Patient Instructions (Signed)
Visit Information  Thank you for taking time to visit with me today. Please don't hesitate to contact me if I can be of assistance to you.   Following are the goals we discussed today:   Goals Addressed             This Visit's Progress    Continued improvement post hospitalization and managment of health conditions       Interventions Today    Flowsheet Row Most Recent Value  Chronic Disease   Chronic disease during today's visit Congestive Heart Failure (CHF), Other  [Bilateral LE swelling]  General Interventions   General Interventions Discussed/Reviewed General Interventions Reviewed, Doctor Visits, Labs  [evaluation of current treatment plan for listed health conditions and patients adherence to plan as established by provider. Assessed for HF symptoms]  Labs Kidney Function  [kidney and potassium lab results discussed]  Doctor Visits Discussed/Reviewed Doctor Visits Reviewed  Annabell Sabal upcoming provider visits .Advised to keep follow up visit. Discussed recent nephrology visit]  Education Interventions   Education Provided Provided Education  [Advised to contact provider for ongoing symptoms. Confirmed patient adherening to provider recommended  action plan for HF management]  Pharmacy Interventions   Pharmacy Dicussed/Reviewed Pharmacy Topics Reviewed  [medication list reviewed. discussed medication adjustments. Advised to take medications as prescribed.]              Our next appointment is by telephone on 03/28/23 at 10 am  Please call the care guide team at [phone] if you need to cancel or reschedule your appointment.   If you are experiencing a Mental Health or Behavioral Health Crisis or need someone to talk to, please call the Suicide and Crisis Lifeline: 988 call 1-800-273-TALK (toll free, 24 hour hotline)  Patient verbalizes understanding of instructions and care plan provided today and agrees to view in MyChart. Active MyChart status and patient  understanding of how to access instructions and care plan via MyChart confirmed with patient.     George Ina RN, BSN, CCM [employer], Population Health Case Manager Phone: [phone]

## 2023-03-15 NOTE — Telephone Encounter (Signed)
Called and informed daughter.

## 2023-03-22 ENCOUNTER — Emergency Department: Payer: Medicare HMO

## 2023-03-22 ENCOUNTER — Ambulatory Visit: Payer: Medicare HMO | Admitting: Internal Medicine

## 2023-03-22 ENCOUNTER — Other Ambulatory Visit: Payer: Self-pay

## 2023-03-22 ENCOUNTER — Ambulatory Visit: Payer: Self-pay | Admitting: Internal Medicine

## 2023-03-22 ENCOUNTER — Encounter: Payer: Self-pay | Admitting: Internal Medicine

## 2023-03-22 VITALS — BP 170/76 | HR 66 | Ht 63.0 in | Wt 118.6 lb

## 2023-03-22 DIAGNOSIS — J9601 Acute respiratory failure with hypoxia: Secondary | ICD-10-CM | POA: Diagnosis present

## 2023-03-22 DIAGNOSIS — G9349 Other encephalopathy: Secondary | ICD-10-CM | POA: Diagnosis not present

## 2023-03-22 DIAGNOSIS — I5022 Chronic systolic (congestive) heart failure: Secondary | ICD-10-CM

## 2023-03-22 DIAGNOSIS — Z86711 Personal history of pulmonary embolism: Secondary | ICD-10-CM

## 2023-03-22 DIAGNOSIS — Z8701 Personal history of pneumonia (recurrent): Secondary | ICD-10-CM | POA: Diagnosis not present

## 2023-03-22 DIAGNOSIS — I480 Paroxysmal atrial fibrillation: Secondary | ICD-10-CM | POA: Diagnosis present

## 2023-03-22 DIAGNOSIS — M1990 Unspecified osteoarthritis, unspecified site: Secondary | ICD-10-CM | POA: Diagnosis present

## 2023-03-22 DIAGNOSIS — A4189 Other specified sepsis: Principal | ICD-10-CM | POA: Diagnosis present

## 2023-03-22 DIAGNOSIS — N1832 Chronic kidney disease, stage 3b: Secondary | ICD-10-CM | POA: Diagnosis present

## 2023-03-22 DIAGNOSIS — Z79899 Other long term (current) drug therapy: Secondary | ICD-10-CM

## 2023-03-22 DIAGNOSIS — R600 Localized edema: Secondary | ICD-10-CM

## 2023-03-22 DIAGNOSIS — Z950 Presence of cardiac pacemaker: Secondary | ICD-10-CM | POA: Diagnosis not present

## 2023-03-22 DIAGNOSIS — U071 COVID-19: Secondary | ICD-10-CM | POA: Diagnosis present

## 2023-03-22 DIAGNOSIS — I517 Cardiomegaly: Secondary | ICD-10-CM | POA: Diagnosis not present

## 2023-03-22 DIAGNOSIS — E785 Hyperlipidemia, unspecified: Secondary | ICD-10-CM | POA: Diagnosis present

## 2023-03-22 DIAGNOSIS — Z823 Family history of stroke: Secondary | ICD-10-CM | POA: Diagnosis not present

## 2023-03-22 DIAGNOSIS — G9340 Encephalopathy, unspecified: Secondary | ICD-10-CM | POA: Diagnosis not present

## 2023-03-22 DIAGNOSIS — R001 Bradycardia, unspecified: Secondary | ICD-10-CM | POA: Diagnosis present

## 2023-03-22 DIAGNOSIS — R4182 Altered mental status, unspecified: Secondary | ICD-10-CM | POA: Diagnosis not present

## 2023-03-22 DIAGNOSIS — I5043 Acute on chronic combined systolic (congestive) and diastolic (congestive) heart failure: Secondary | ICD-10-CM | POA: Diagnosis present

## 2023-03-22 DIAGNOSIS — Z803 Family history of malignant neoplasm of breast: Secondary | ICD-10-CM | POA: Diagnosis not present

## 2023-03-22 DIAGNOSIS — R059 Cough, unspecified: Secondary | ICD-10-CM | POA: Diagnosis not present

## 2023-03-22 DIAGNOSIS — K219 Gastro-esophageal reflux disease without esophagitis: Secondary | ICD-10-CM | POA: Diagnosis present

## 2023-03-22 DIAGNOSIS — R531 Weakness: Secondary | ICD-10-CM | POA: Diagnosis not present

## 2023-03-22 DIAGNOSIS — H3530 Unspecified macular degeneration: Secondary | ICD-10-CM | POA: Diagnosis present

## 2023-03-22 DIAGNOSIS — Z7901 Long term (current) use of anticoagulants: Secondary | ICD-10-CM | POA: Diagnosis not present

## 2023-03-22 DIAGNOSIS — E039 Hypothyroidism, unspecified: Secondary | ICD-10-CM | POA: Diagnosis present

## 2023-03-22 DIAGNOSIS — Z888 Allergy status to other drugs, medicaments and biological substances status: Secondary | ICD-10-CM

## 2023-03-22 DIAGNOSIS — Z9049 Acquired absence of other specified parts of digestive tract: Secondary | ICD-10-CM

## 2023-03-22 DIAGNOSIS — G9341 Metabolic encephalopathy: Secondary | ICD-10-CM | POA: Diagnosis present

## 2023-03-22 DIAGNOSIS — R0602 Shortness of breath: Secondary | ICD-10-CM | POA: Diagnosis present

## 2023-03-22 DIAGNOSIS — E034 Atrophy of thyroid (acquired): Secondary | ICD-10-CM | POA: Diagnosis not present

## 2023-03-22 DIAGNOSIS — R112 Nausea with vomiting, unspecified: Secondary | ICD-10-CM | POA: Diagnosis not present

## 2023-03-22 DIAGNOSIS — I2510 Atherosclerotic heart disease of native coronary artery without angina pectoris: Secondary | ICD-10-CM | POA: Diagnosis present

## 2023-03-22 DIAGNOSIS — Z8249 Family history of ischemic heart disease and other diseases of the circulatory system: Secondary | ICD-10-CM | POA: Diagnosis not present

## 2023-03-22 DIAGNOSIS — Z79890 Hormone replacement therapy (postmenopausal): Secondary | ICD-10-CM

## 2023-03-22 DIAGNOSIS — R918 Other nonspecific abnormal finding of lung field: Secondary | ICD-10-CM | POA: Diagnosis not present

## 2023-03-22 DIAGNOSIS — R0989 Other specified symptoms and signs involving the circulatory and respiratory systems: Secondary | ICD-10-CM | POA: Diagnosis not present

## 2023-03-22 DIAGNOSIS — I509 Heart failure, unspecified: Secondary | ICD-10-CM | POA: Diagnosis not present

## 2023-03-22 DIAGNOSIS — Z8673 Personal history of transient ischemic attack (TIA), and cerebral infarction without residual deficits: Secondary | ICD-10-CM

## 2023-03-22 DIAGNOSIS — I050 Rheumatic mitral stenosis: Secondary | ICD-10-CM | POA: Diagnosis present

## 2023-03-22 DIAGNOSIS — I130 Hypertensive heart and chronic kidney disease with heart failure and stage 1 through stage 4 chronic kidney disease, or unspecified chronic kidney disease: Secondary | ICD-10-CM | POA: Diagnosis present

## 2023-03-22 LAB — CBC
HCT: 39.4 % (ref 36.0–46.0)
Hemoglobin: 12.5 g/dL (ref 12.0–15.0)
MCH: 28.6 pg (ref 26.0–34.0)
MCHC: 31.7 g/dL (ref 30.0–36.0)
MCV: 90.2 fL (ref 80.0–100.0)
Platelets: 269 10*3/uL (ref 150–400)
RBC: 4.37 MIL/uL (ref 3.87–5.11)
RDW: 15.9 % — ABNORMAL HIGH (ref 11.5–15.5)
WBC: 6.3 10*3/uL (ref 4.0–10.5)
nRBC: 0 % (ref 0.0–0.2)

## 2023-03-22 LAB — COMPREHENSIVE METABOLIC PANEL
ALT: 19 U/L (ref 0–44)
AST: 24 U/L (ref 15–41)
Albumin: 4.3 g/dL (ref 3.5–5.0)
Alkaline Phosphatase: 71 U/L (ref 38–126)
Anion gap: 11 (ref 5–15)
BUN: 11 mg/dL (ref 8–23)
CO2: 26 mmol/L (ref 22–32)
Calcium: 9.3 mg/dL (ref 8.9–10.3)
Chloride: 104 mmol/L (ref 98–111)
Creatinine, Ser: 1.04 mg/dL — ABNORMAL HIGH (ref 0.44–1.00)
GFR, Estimated: 51 mL/min — ABNORMAL LOW (ref >60)
Glucose, Bld: 120 mg/dL — ABNORMAL HIGH (ref 70–99)
Potassium: 3.9 mmol/L (ref 3.5–5.1)
Sodium: 141 mmol/L (ref 135–145)
Total Bilirubin: 1.3 mg/dL — ABNORMAL HIGH (ref 0.0–1.2)
Total Protein: 7.6 g/dL (ref 6.5–8.1)

## 2023-03-22 LAB — RESP PANEL BY RT-PCR (RSV, FLU A&B, COVID)  RVPGX2
Influenza A by PCR: NEGATIVE
Influenza B by PCR: NEGATIVE
Resp Syncytial Virus by PCR: NEGATIVE
SARS Coronavirus 2 by RT PCR: POSITIVE — AB

## 2023-03-22 LAB — TROPONIN I (HIGH SENSITIVITY)
Troponin I (High Sensitivity): 32 ng/L — ABNORMAL HIGH (ref <18)
Troponin I (High Sensitivity): 45 ng/L — ABNORMAL HIGH (ref <18)

## 2023-03-22 MED ORDER — ACETAMINOPHEN 325 MG PO TABS
650.0000 mg | ORAL_TABLET | Freq: Once | ORAL | Status: AC
Start: 2023-03-22 — End: 2023-03-22
  Administered 2023-03-22: 650 mg via ORAL
  Filled 2023-03-22: qty 2

## 2023-03-22 MED ORDER — FUROSEMIDE 20 MG PO TABS: 40.0000 mg | ORAL_TABLET | Freq: Every day | ORAL | 2 refills | Status: DC

## 2023-03-22 MED ORDER — LEVOTHYROXINE SODIUM 50 MCG PO TABS: 50.0000 ug | ORAL_TABLET | Freq: Every day | ORAL | 0 refills | Status: DC

## 2023-03-22 MED ORDER — ACETAMINOPHEN 160 MG/5ML PO SUSP
ORAL | Status: AC
  Filled 2023-03-22: qty 25

## 2023-03-22 NOTE — Progress Notes (Addendum)
 Subjective:  Patient ID: Frances Kent, female    DOB: [birth year]  Age: 89 y.o. MRN: [id number]  CC: The primary encounter diagnosis was Bilateral leg edema. Diagnoses of Hypothyroidism due to acquired atrophy of thyroid  and Chronic HFrEF (heart failure with reduced ejection fraction) (HCC) were also pertinent to this visit.   T  Frances Kent presents for  Chief Complaint  Patient presents with   Medical Management of Chronic Issues   Follow up on weight gain and lower extremity edema  Frances Kent was Seen by NP Gretel  PN JAN 28 for bilateral PITTING EDEMA  AND DYSPNEA WITH 3 LB WEIGHT GAIN .  DVT was rule out with doppler U/s  .;  BNP was > 1000.   She was advised to increase dpse pf lasxi from every other day to daily for 5 days  but has continued beyond 5 days because she has not lost weight and has developed a hacking cough.  She denies fevers,  chills and body aches.  sleeps on an adjustable bed that rasises her head.    HAS BEEN WEIGHING DAILY,  WT HAS BEEN 115 LBS .  Her weight has  decreased by 3 ounces since Jan 28    Outpatient Medications Prior to Visit  Medication Sig Dispense Refill   acetaminophen  (TYLENOL ) 500 MG tablet Take 500 mg by mouth every 6 (six) hours as needed.     levalbuterol  (XOPENEX ) 0.63 MG/3ML nebulizer solution USE 1  VIAL IN NEBULIZER EVERY 4 HOURS AS NEEDED FOR WHEEZING AND FOR SHORTNESS OF BREATH 150 mL 5   metoprolol  succinate (TOPROL -XL) 25 MG 24 hr tablet Take 1 tablet (25 mg total) by mouth 2 (two) times daily. 60 tablet 1   Probiotic CHEW Chew 2 tablets by mouth daily.     traZODone  (DESYREL ) 50 MG tablet Take 0.5-1 tablets (25-50 mg total) by mouth at bedtime as needed for sleep. 30 tablet 3   XARELTO  15 MG TABS tablet TAKE 1 TABLET EVERY DAY 90 tablet 3   furosemide  (LASIX ) 20 MG tablet TAKE 1 TABLET BY MOUTH EVERY OTHER DAY 45 tablet 1   levothyroxine  (SYNTHROID ) 50 MCG tablet Take 1 tablet (50 mcg total) by mouth daily before breakfast. 90  tablet 0   No facility-administered medications prior to visit.    Review of Systems;  Patient denies headache, fevers, malaise, unintentional weight loss, skin rash, eye pain, sinus congestion and sinus pain, sore throat, dysphagia,  hemoptysis , wheezing, chest pain, palpitations, orthopnea, edema, abdominal pain, nausea, melena, diarrhea, constipation, flank pain, dysuria, hematuria, urinary  Frequency, nocturia, numbness, tingling, seizures,  Focal weakness, Loss of consciousness,  Tremor, insomnia, depression, anxiety, and suicidal ideation.      Objective:  BP (!) 170/76   Pulse 66   Ht 5' 3 (1.6 m)   Wt 118 lb 9.6 oz (53.8 kg)   SpO2 95%   BMI 21.01 kg/m   BP Readings from Last 3 Encounters:  03/22/23 (!) 179/66  03/22/23 (!) 170/76  03/14/23 110/72    Wt Readings from Last 3 Encounters:  03/22/23 116 lb 13.5 oz (53 kg)  03/22/23 118 lb 9.6 oz (53.8 kg)  03/14/23 117 lb (53.1 kg)    Physical Exam Vitals reviewed.  Constitutional:      General: She is not in acute distress.    Appearance: Normal appearance. She is normal weight. She is not ill-appearing, toxic-appearing or diaphoretic.  HENT:     Head: Normocephalic.  Eyes:     General: No scleral icterus.       Right eye: No discharge.        Left eye: No discharge.     Conjunctiva/sclera: Conjunctivae normal.  Cardiovascular:     Rate and Rhythm: Normal rate and regular rhythm.     Heart sounds: Normal heart sounds.  Pulmonary:     Effort: Pulmonary effort is normal. No respiratory distress.     Breath sounds: Normal breath sounds.  Musculoskeletal:        General: Normal range of motion.     Right lower leg: Edema present.     Left lower leg: Edema present.  Skin:    General: Skin is warm and dry.  Neurological:     General: No focal deficit present.     Mental Status: She is alert and oriented to person, place, and time. Mental status is at baseline.  Psychiatric:        Mood and Affect: Mood  normal.        Behavior: Behavior normal.        Thought Content: Thought content normal.        Judgment: Judgment normal.     Lab Results  Component Value Date   HGBA1C 5.8 (H) 11/02/2020   HGBA1C 6.1 (H) 10/16/2020    Lab Results  Component Value Date   CREATININE 1.04 (H) 03/22/2023   CREATININE 1.11 03/14/2023   CREATININE 1.28 (H) 01/21/2023    Lab Results  Component Value Date   WBC 6.3 03/22/2023   HGB 12.5 03/22/2023   HCT 39.4 03/22/2023   PLT 269 03/22/2023   GLUCOSE 120 (H) 03/22/2023   CHOL 257 (H) 11/23/2021   TRIG 104.0 11/23/2021   HDL 96.40 11/23/2021   LDLDIRECT 185.3 03/27/2013   LDLCALC 140 (H) 11/23/2021   ALT 19 03/22/2023   AST 24 03/22/2023   NA 141 03/22/2023   K 3.9 03/22/2023   CL 104 03/22/2023   CREATININE 1.04 (H) 03/22/2023   BUN 11 03/22/2023   CO2 26 03/22/2023   TSH 5.67 (H) 03/14/2023   INR 3.0 (H) 10/15/2020   HGBA1C 5.8 (H) 11/02/2020    US  Venous Img Lower Unilateral Left (DVT) Result Date: 03/15/2023 CLINICAL DATA:  Progressive left lower extremity edema EXAM: LEFT LOWER EXTREMITY VENOUS DOPPLER ULTRASOUND TECHNIQUE: Gray-scale sonography with graded compression, as well as color Doppler and duplex ultrasound were performed to evaluate the lower extremity deep venous systems from the level of the common femoral vein and including the common femoral, femoral, profunda femoral, popliteal and calf veins including the posterior tibial, peroneal and gastrocnemius veins when visible. The superficial great saphenous vein was also interrogated. Spectral Doppler was utilized to evaluate flow at rest and with distal augmentation maneuvers in the common femoral, femoral and popliteal veins. COMPARISON:  None Available. FINDINGS: Contralateral Common Femoral Vein: Respiratory phasicity is normal and symmetric with the symptomatic side. No evidence of thrombus. Normal compressibility. Common Femoral Vein: No evidence of acute thrombus. Mild  eccentric wall thickening with echogenicity likely representing calcified residua of prior DVT. Saphenofemoral Junction: No evidence of thrombus. Normal compressibility and flow on color Doppler imaging. Profunda Femoral Vein: No evidence of thrombus. Normal compressibility and flow on color Doppler imaging. Femoral Vein: No evidence of thrombus. Normal compressibility, respiratory phasicity and response to augmentation. Popliteal Vein: No evidence of thrombus. Normal compressibility, respiratory phasicity and response to augmentation. Calf Veins: No evidence of thrombus. Normal compressibility and flow  on color Doppler imaging. Superficial Great Saphenous Vein: No evidence of thrombus. Normal compressibility. Venous Reflux:  None. Other Findings:  None. IMPRESSION: 1. No evidence of acute DVT in the left lower extremity. 2. Mild sequelae of recanalized chronic DVT in the left common femoral vein noted incidentally. Electronically Signed   By: Wilkie Lent M.D.   On: 03/15/2023 16:13    Assessment & Plan:  .Bilateral leg edema -     Brain natriuretic peptide; Future -     Basic metabolic panel; Future  Hypothyroidism due to acquired atrophy of thyroid  -     TSH; Future  Chronic HFrEF (heart failure with reduced ejection fraction) (HCC) Assessment & Plan: I have increased her furosemide  dose to 40 mg daily for up to one week, until her weight returns to 114 lbs return in one week for BMET/.BNP   Other orders -     Levothyroxine  Sodium; Take 1 tablet (50 mcg total) by mouth daily before breakfast.  Dispense: 90 tablet; Refill: 0 -     Furosemide ; Take 2 tablets (40 mg total) by mouth daily. FOR 5 DAYS,  THEN ONCE DAILY AS DIRECTED  Dispense: 60 tablet; Refill: 2      Follow-up: Return in about 3 months (around 06/19/2023).   Verneita LITTIE Kettering, MD

## 2023-03-22 NOTE — Patient Instructions (Addendum)
 Take one extra dose of levothyroxine  EACH WEEK,   and continue one dose daily THE OTHER 6 DAYS  OF THE WEEK      INCREASE THE LASIX  TO 40 MG DAILY  AND WEIGH YOURSELF EVERY DAY ONCE YOU HIT 113 LBS  REDUCE OSE TO 20 MG DAILY  LABS TO BE REPEATED NEXT FRIDAY   IF YOU DON'T HIT 113 AFTER ONE WEEK,  LET ME KNOW   LOOK AT YOUR SOUPS, AND MAC/CHEESE  THEY MAY BE TOO HIGH IN SODIUM  RESTRICT YOUR SODIUM TO 2000 MG DAILY

## 2023-03-22 NOTE — Assessment & Plan Note (Signed)
 I have increased her furosemide  dose to 40 mg daily for up to one week, until her weight returns to 114 lbs return in one week for BMET/.BNP

## 2023-03-22 NOTE — ED Triage Notes (Signed)
 Arrives with daughter.  Had PCP appointment today, BP was elevated.  Once returned home, daughter called and patient was more confused and BP hight, 190/76.  Daughter states patient thought she had been to doctors office yesterday, but actually today. Patient with a cough, worse today. Small amount of vomiting x 2 today.

## 2023-03-22 NOTE — Telephone Encounter (Signed)
  Chief Complaint: HTN Symptoms: disorientation, unsteady gait, cough, weakness, lethargy Frequency: this morning Pertinent Negatives: Patient denies fever Disposition: [x] ED /[] Urgent Care (no appt availability in office) / [] Appointment(In office/virtual)/ []  [facility] [facility]/ [] Home Care/ [] Refused Recommended Disposition /[] [place] [facility]/ []  Follow-up with PCP Additional Notes: Patient daughter, Frances Kent, calling c/o disorientation, HTN, unsteady gait, weakness/lethargy, and coughing. Reports she was seen at PCP office this AM and had elevation in BP and was sent home. Per daughter, pt now having confusion, poor appetite, weakness/lethargy, and more unsteady than usual. Of note, Frances Kent and patient currently in ED parking lot. Triager advised to be assessed by ED d/t neurological sx patient is experiencing. Caregiver verbalized understanding.    Copied from CRM [id number]. Topic: Clinical - Red Word Triage >> Mar 22, 2023  4:19 PM Frances Kent wrote: Reason for CRM: patient daughter would like a call back concerning the patient blood pressure. Patient daughter stated the patient had an appointment today regarding her elevated blood pressure. Patient daughter stated the patient blood pressure has increased and she is disoriented. Patient daughter called stating she is at the Emergency room now and does not know whether to take the patient in or not because they do not have a wheel chair  Speaking with Frances Kent daughter Reason for Disposition  [1] Systolic BP  >= 160 OR Diastolic >= 100 AND [2] cardiac (e.g., breathing difficulty, chest pain) or neurologic symptoms (e.g., new-onset blurred or double vision, unsteady gait)  Answer Assessment - Initial Assessment Questions 1. BLOOD PRESSURE: What is the blood pressure? Did you take at least two measurements 5 minutes apart?     190/76 2. ONSET: When did you take your blood pressure?     15 mins ago Currently in ED parking lot, unable to take   3. HOW: How did you take your blood pressure? (e.g., automatic home BP monitor, visiting nurse)     Auto BP 4. HISTORY: Do you have a history of high blood pressure?     yes 5. MEDICINES: Are you taking any medicines for blood pressure? Have you missed any doses recently?     Metoprolol  lasix  Denies missing medication 6. OTHER SYMPTOMS: Do you have any symptoms? (e.g., blurred vision, chest pain, difficulty breathing, headache, weakness)     Cough, unstable Denies pain, blurred vision, Disorientation/confusion Denies falls/head injuries Reports had PCP visit today with slightly elevated BP Currently at ED  Protocols used: Blood Pressure - High-A-AH

## 2023-03-22 NOTE — Telephone Encounter (Signed)
 Attempted to call pt's daughter. Pt is currently in the ED.

## 2023-03-22 NOTE — ED Provider Triage Note (Signed)
 Emergency Medicine Provider Triage Evaluation Note  Frances Kent , a 89 y.o. female  was evaluated in triage.  Pt complains of altered mental status, cough  Review of Systems  Positive:  Negative:   Physical Exam  BP (!) 137/97 (BP Location: Left Arm)   Pulse 70   Temp (!) 101 F (38.3 C) (Oral)   Resp 16   Wt 53 kg   SpO2 94%   BMI 20.70 kg/m  Gen:   Awake, no distress, febrile Resp:  Normal effort , no wheezing MSK:   Moves extremities without difficulty  Other:    Medical Decision Making  Medically screening exam initiated at 5:24 PM.  Appropriate orders placed.  MICHELE JUDY was informed that the remainder of the evaluation will be completed by another provider, this initial triage assessment does not replace that evaluation, and the importance of remaining in the ED until their evaluation is complete. Patient with altered mental status ordered CBC CMP UA   Janit Kast, PA-C 03/22/23 1725

## 2023-03-23 ENCOUNTER — Inpatient Hospital Stay
Admission: EM | Admit: 2023-03-23 | Discharge: 2023-03-25 | DRG: 871 | Disposition: A | Discharge status: Home / Self Care | Payer: Medicare HMO | Attending: Family Medicine | Admitting: Family Medicine

## 2023-03-23 ENCOUNTER — Other Ambulatory Visit: Payer: Self-pay

## 2023-03-23 ENCOUNTER — Observation Stay: Payer: Medicare HMO

## 2023-03-23 ENCOUNTER — Inpatient Hospital Stay: Payer: Medicare HMO

## 2023-03-23 DIAGNOSIS — G9349 Other encephalopathy: Secondary | ICD-10-CM | POA: Diagnosis not present

## 2023-03-23 DIAGNOSIS — Z8673 Personal history of transient ischemic attack (TIA), and cerebral infarction without residual deficits: Secondary | ICD-10-CM | POA: Diagnosis not present

## 2023-03-23 DIAGNOSIS — I050 Rheumatic mitral stenosis: Secondary | ICD-10-CM | POA: Diagnosis present

## 2023-03-23 DIAGNOSIS — R4182 Altered mental status, unspecified: Secondary | ICD-10-CM | POA: Diagnosis not present

## 2023-03-23 DIAGNOSIS — E039 Hypothyroidism, unspecified: Secondary | ICD-10-CM | POA: Diagnosis present

## 2023-03-23 DIAGNOSIS — I509 Heart failure, unspecified: Secondary | ICD-10-CM | POA: Diagnosis not present

## 2023-03-23 DIAGNOSIS — Z8701 Personal history of pneumonia (recurrent): Secondary | ICD-10-CM | POA: Diagnosis not present

## 2023-03-23 DIAGNOSIS — I130 Hypertensive heart and chronic kidney disease with heart failure and stage 1 through stage 4 chronic kidney disease, or unspecified chronic kidney disease: Secondary | ICD-10-CM | POA: Diagnosis present

## 2023-03-23 DIAGNOSIS — Z8249 Family history of ischemic heart disease and other diseases of the circulatory system: Secondary | ICD-10-CM | POA: Diagnosis not present

## 2023-03-23 DIAGNOSIS — R531 Weakness: Secondary | ICD-10-CM

## 2023-03-23 DIAGNOSIS — R001 Bradycardia, unspecified: Secondary | ICD-10-CM | POA: Diagnosis present

## 2023-03-23 DIAGNOSIS — H3530 Unspecified macular degeneration: Secondary | ICD-10-CM | POA: Diagnosis present

## 2023-03-23 DIAGNOSIS — R1110 Vomiting, unspecified: Secondary | ICD-10-CM

## 2023-03-23 DIAGNOSIS — U071 COVID-19: Secondary | ICD-10-CM | POA: Diagnosis present

## 2023-03-23 DIAGNOSIS — I5043 Acute on chronic combined systolic (congestive) and diastolic (congestive) heart failure: Secondary | ICD-10-CM | POA: Diagnosis present

## 2023-03-23 DIAGNOSIS — N1832 Chronic kidney disease, stage 3b: Secondary | ICD-10-CM | POA: Diagnosis present

## 2023-03-23 DIAGNOSIS — Z803 Family history of malignant neoplasm of breast: Secondary | ICD-10-CM | POA: Diagnosis not present

## 2023-03-23 DIAGNOSIS — G9341 Metabolic encephalopathy: Secondary | ICD-10-CM | POA: Diagnosis present

## 2023-03-23 DIAGNOSIS — Z950 Presence of cardiac pacemaker: Secondary | ICD-10-CM | POA: Diagnosis not present

## 2023-03-23 DIAGNOSIS — Z9049 Acquired absence of other specified parts of digestive tract: Secondary | ICD-10-CM | POA: Diagnosis not present

## 2023-03-23 DIAGNOSIS — R0602 Shortness of breath: Secondary | ICD-10-CM | POA: Diagnosis present

## 2023-03-23 DIAGNOSIS — R918 Other nonspecific abnormal finding of lung field: Secondary | ICD-10-CM | POA: Diagnosis not present

## 2023-03-23 DIAGNOSIS — I480 Paroxysmal atrial fibrillation: Secondary | ICD-10-CM | POA: Diagnosis present

## 2023-03-23 DIAGNOSIS — K219 Gastro-esophageal reflux disease without esophagitis: Secondary | ICD-10-CM | POA: Diagnosis present

## 2023-03-23 DIAGNOSIS — E785 Hyperlipidemia, unspecified: Secondary | ICD-10-CM | POA: Diagnosis present

## 2023-03-23 DIAGNOSIS — Z86711 Personal history of pulmonary embolism: Secondary | ICD-10-CM | POA: Diagnosis not present

## 2023-03-23 DIAGNOSIS — J9601 Acute respiratory failure with hypoxia: Secondary | ICD-10-CM | POA: Diagnosis present

## 2023-03-23 DIAGNOSIS — R0989 Other specified symptoms and signs involving the circulatory and respiratory systems: Secondary | ICD-10-CM | POA: Diagnosis not present

## 2023-03-23 DIAGNOSIS — A4189 Other specified sepsis: Secondary | ICD-10-CM | POA: Diagnosis present

## 2023-03-23 DIAGNOSIS — I2510 Atherosclerotic heart disease of native coronary artery without angina pectoris: Secondary | ICD-10-CM | POA: Diagnosis present

## 2023-03-23 DIAGNOSIS — Z7901 Long term (current) use of anticoagulants: Secondary | ICD-10-CM | POA: Diagnosis not present

## 2023-03-23 DIAGNOSIS — Z823 Family history of stroke: Secondary | ICD-10-CM | POA: Diagnosis not present

## 2023-03-23 LAB — BASIC METABOLIC PANEL WITH GFR
Anion gap: 9 (ref 5–15)
BUN: 9 mg/dL (ref 8–23)
CO2: 23 mmol/L (ref 22–32)
Calcium: 9 mg/dL (ref 8.9–10.3)
Chloride: 105 mmol/L (ref 98–111)
Creatinine, Ser: 0.88 mg/dL (ref 0.44–1.00)
GFR, Estimated: >60 mL/min
Glucose, Bld: 114 mg/dL — ABNORMAL HIGH (ref 70–99)
Potassium: 3.7 mmol/L (ref 3.5–5.1)
Sodium: 137 mmol/L (ref 135–145)

## 2023-03-23 LAB — CBC
HCT: 35 % — ABNORMAL LOW (ref 36.0–46.0)
Hemoglobin: 11.2 g/dL — ABNORMAL LOW (ref 12.0–15.0)
MCH: 29.1 pg (ref 26.0–34.0)
MCHC: 32 g/dL (ref 30.0–36.0)
MCV: 90.9 fL (ref 80.0–100.0)
Platelets: 201 10*3/uL (ref 150–400)
RBC: 3.85 MIL/uL — ABNORMAL LOW (ref 3.87–5.11)
RDW: 16.1 % — ABNORMAL HIGH (ref 11.5–15.5)
WBC: 5.7 10*3/uL (ref 4.0–10.5)
nRBC: 0 % (ref 0.0–0.2)

## 2023-03-23 LAB — URINALYSIS, ROUTINE W REFLEX MICROSCOPIC
Bilirubin Urine: NEGATIVE
Glucose, UA: NEGATIVE mg/dL
Ketones, ur: 5 mg/dL — AB
Leukocytes,Ua: NEGATIVE
Nitrite: NEGATIVE
Protein, ur: >=300 mg/dL — AB
Specific Gravity, Urine: 1.025 (ref 1.005–1.030)
pH: 6 (ref 5.0–8.0)

## 2023-03-23 LAB — PROCALCITONIN: Procalcitonin: <0.1 ng/mL

## 2023-03-23 MED ORDER — SODIUM CHLORIDE 0.9% FLUSH
3.0000 mL | Freq: Two times a day (BID) | INTRAVENOUS | Status: DC
  Administered 2023-03-23 – 2023-03-25 (×5): 3 mL via INTRAVENOUS

## 2023-03-23 MED ORDER — FUROSEMIDE 10 MG/ML IJ SOLN
40.0000 mg | Freq: Every day | INTRAMUSCULAR | Status: DC
  Administered 2023-03-23 – 2023-03-25 (×3): 40 mg via INTRAVENOUS
  Filled 2023-03-23 (×3): qty 4

## 2023-03-23 MED ORDER — RISAQUAD PO CAPS
1.0000 | ORAL_CAPSULE | Freq: Every day | ORAL | Status: DC
  Administered 2023-03-24: 1 via ORAL
  Filled 2023-03-23 (×3): qty 1

## 2023-03-23 MED ORDER — TRAZODONE HCL 50 MG PO TABS
25.0000 mg | ORAL_TABLET | Freq: Every evening | ORAL | Status: DC | PRN
  Administered 2023-03-24 (×2): 50 mg via ORAL
  Filled 2023-03-23 (×2): qty 1

## 2023-03-23 MED ORDER — HYDRALAZINE HCL 20 MG/ML IJ SOLN
5.0000 mg | Freq: Four times a day (QID) | INTRAMUSCULAR | Status: DC | PRN
  Administered 2023-03-23 (×2): 5 mg via INTRAVENOUS
  Filled 2023-03-23 (×2): qty 1

## 2023-03-23 MED ORDER — METOPROLOL SUCCINATE ER 25 MG PO TB24
25.0000 mg | ORAL_TABLET | Freq: Once | ORAL | Status: AC
  Administered 2023-03-23: 25 mg via ORAL
  Filled 2023-03-23: qty 1

## 2023-03-23 MED ORDER — PREDNISONE 20 MG PO TABS: 50.0000 mg | ORAL_TABLET | Freq: Every day | ORAL | Status: DC

## 2023-03-23 MED ORDER — FUROSEMIDE 40 MG PO TABS: 20.0000 mg | ORAL_TABLET | Freq: Every day | ORAL | Status: DC

## 2023-03-23 MED ORDER — METOCLOPRAMIDE HCL 5 MG/ML IJ SOLN: 10.0000 mg | Freq: Four times a day (QID) | INTRAMUSCULAR | Status: DC | PRN

## 2023-03-23 MED ORDER — MAGNESIUM HYDROXIDE 400 MG/5ML PO SUSP: 30.0000 mL | Freq: Every day | ORAL | Status: DC | PRN

## 2023-03-23 MED ORDER — ACETAMINOPHEN 325 MG PO TABS
650.0000 mg | ORAL_TABLET | Freq: Four times a day (QID) | ORAL | Status: DC | PRN
  Administered 2023-03-23: 650 mg via ORAL
  Filled 2023-03-23: qty 2

## 2023-03-23 MED ORDER — FUROSEMIDE 40 MG PO TABS: 40.0000 mg | ORAL_TABLET | Freq: Every day | ORAL | Status: DC

## 2023-03-23 MED ORDER — METHYLPREDNISOLONE SODIUM SUCC 40 MG IJ SOLR
0.5000 mg/kg | Freq: Two times a day (BID) | INTRAMUSCULAR | Status: DC
  Administered 2023-03-23 – 2023-03-24 (×3): 26.4 mg via INTRAVENOUS
  Filled 2023-03-23 (×3): qty 1

## 2023-03-23 MED ORDER — SODIUM CHLORIDE 0.9% FLUSH: 3.0000 mL | INTRAVENOUS | Status: DC | PRN

## 2023-03-23 MED ORDER — LISINOPRIL 20 MG PO TABS
20.0000 mg | ORAL_TABLET | Freq: Every day | ORAL | Status: DC
  Administered 2023-03-23 – 2023-03-25 (×3): 20 mg via ORAL
  Filled 2023-03-23: qty 1
  Filled 2023-03-23: qty 2
  Filled 2023-03-23: qty 4

## 2023-03-23 MED ORDER — LEVOTHYROXINE SODIUM 50 MCG PO TABS
50.0000 ug | ORAL_TABLET | Freq: Every day | ORAL | Status: DC
  Administered 2023-03-23 – 2023-03-25 (×3): 50 ug via ORAL
  Filled 2023-03-23 (×3): qty 1

## 2023-03-23 MED ORDER — GUAIFENESIN 100 MG/5ML PO LIQD
5.0000 mL | ORAL | Status: DC | PRN
  Administered 2023-03-24 (×2): 5 mL via ORAL
  Filled 2023-03-23 (×2): qty 10

## 2023-03-23 MED ORDER — CARVEDILOL 3.125 MG PO TABS
3.1250 mg | ORAL_TABLET | Freq: Two times a day (BID) | ORAL | Status: DC
  Administered 2023-03-25: 3.125 mg via ORAL
  Filled 2023-03-23 (×2): qty 1

## 2023-03-23 MED ORDER — ENOXAPARIN SODIUM 40 MG/0.4ML IJ SOSY: 40.0000 mg | PREFILLED_SYRINGE | INTRAMUSCULAR | Status: DC

## 2023-03-23 MED ORDER — CARVEDILOL 6.25 MG PO TABS: 3.1250 mg | ORAL_TABLET | Freq: Two times a day (BID) | ORAL | Status: DC

## 2023-03-23 MED ORDER — SODIUM CHLORIDE 0.9 % IV SOLN: INTRAVENOUS | Status: DC

## 2023-03-23 MED ORDER — PREDNISONE 20 MG PO TABS: 20.0000 mg | ORAL_TABLET | Freq: Two times a day (BID) | ORAL | Status: DC

## 2023-03-23 MED ORDER — ACETAMINOPHEN 650 MG RE SUPP: 650.0000 mg | Freq: Four times a day (QID) | RECTAL | Status: DC | PRN

## 2023-03-23 MED ORDER — METHYLPREDNISOLONE SODIUM SUCC 125 MG IJ SOLR
125.0000 mg | Freq: Once | INTRAMUSCULAR | Status: AC
  Administered 2023-03-23: 125 mg via INTRAVENOUS
  Filled 2023-03-23: qty 2

## 2023-03-23 MED ORDER — ALBUTEROL SULFATE (2.5 MG/3ML) 0.083% IN NEBU
2.5000 mg | INHALATION_SOLUTION | RESPIRATORY_TRACT | Status: DC | PRN
  Administered 2023-03-24: 2.5 mg via RESPIRATORY_TRACT
  Filled 2023-03-23: qty 3

## 2023-03-23 MED ORDER — NIRMATRELVIR/RITONAVIR (PAXLOVID) TABLET (RENAL DOSING)
2.0000 | ORAL_TABLET | Freq: Two times a day (BID) | ORAL | Status: DC
  Administered 2023-03-23 – 2023-03-25 (×5): 2 via ORAL
  Filled 2023-03-23: qty 20

## 2023-03-23 MED ORDER — RIVAROXABAN 15 MG PO TABS
15.0000 mg | ORAL_TABLET | Freq: Once | ORAL | Status: AC
  Administered 2023-03-23: 15 mg via ORAL
  Filled 2023-03-23: qty 1

## 2023-03-23 MED ORDER — ENOXAPARIN SODIUM 60 MG/0.6ML IJ SOSY
50.0000 mg | PREFILLED_SYRINGE | Freq: Two times a day (BID) | INTRAMUSCULAR | Status: DC
Start: 2023-03-24 — End: 2023-03-25
  Administered 2023-03-24 – 2023-03-25 (×3): 50 mg via SUBCUTANEOUS
  Filled 2023-03-23 (×3): qty 0.6

## 2023-03-23 MED ORDER — SODIUM CHLORIDE 0.9 % IV SOLN: 250.0000 mL | INTRAVENOUS | Status: AC | PRN

## 2023-03-23 MED ORDER — RIVAROXABAN 15 MG PO TABS: 15.0000 mg | ORAL_TABLET | Freq: Every day | ORAL | Status: DC

## 2023-03-23 NOTE — Progress Notes (Deleted)
 PHARMACIST - PHYSICIAN COMMUNICATION  CONCERNING:  Enoxaparin  (Lovenox ) for DVT Prophylaxis    RECOMMENDATION: Patient was prescribed enoxaprin 40mg  q24 hours for VTE prophylaxis.   Filed Weights   03/22/23 1717  Weight: 53 kg (116 lb 13.5 oz)    Body mass index is 20.7 kg/m.  Estimated Creatinine Clearance: 29.7 mL/min (A) (by C-G formula based on SCr of 1.04 mg/dL (H)).  Patient is candidate for enoxaparin  30mg  every 24 hours based on CrCl <39ml/min or Weight <45kg  DESCRIPTION: Pharmacy has adjusted enoxaparin  dose per [facility] policy.  Patient is now receiving enoxaparin  30 mg every 24 hours   Rankin CANDIE Dills, PharmD, [facility] 03/23/2023 2:15 AM

## 2023-03-23 NOTE — ED Notes (Signed)
 Hospitalist messaged due to pt having questions in regards to paxlovid 

## 2023-03-23 NOTE — ED Notes (Signed)
 PT assisted to reposition in bed, pt relaxing, denies any needs at this time

## 2023-03-23 NOTE — ED Notes (Signed)
 PT daughter arrived and made this RN aware that pt Right eye appears to be swollen (this RN does not note any swelling at this time) and states that pt told her her vision was blurry this morning. PT did not tell this RN any of this information. Pt said she has a little blurry vision in R eye (does happen sometimes at home, and daughter states she does have macular degeneration). This RN informed hospitalist, see orders

## 2023-03-23 NOTE — ED Notes (Signed)
 Hospitalist messaged due to pt daughter stating pt R

## 2023-03-23 NOTE — ED Provider Notes (Signed)
[facility] Provider Note    Event Date/Time   First MD Initiated Contact with Patient 03/23/23 0126     (approximate)   History   Altered Mental Status   HPI  Frances Kent is a 89 y.o. female with history of atrial fibrillation, hypertension, hypothyroidism, stroke, PE on Xarelto , chronic kidney disease, CHF to the emergency department with complaints of cough that started today, vomiting.  No chest pain, shortness of breath or diarrhea.  No abdominal pain.  Daughter reports she has been very weak today and has been intermittently confused.  No headache, numbness, tingling or weakness.  No falls.   Daughter reports her legs have been more swollen.  She did have a DVT study of the left lower extremity on 03/15/2023 that showed no acute DVT.  Daughter states her PCP increased her furosemide  for the next week.  History provided by patient, daughter.    Past Medical History:  Diagnosis Date   Arthritis    Atrial fibrillation (HCC)    Benign breast cyst in female, left 10/07/2016   Candida esophagitis (HCC) 05/03/2021   Found on Mar 10 2021 EGD    CAP (community acquired pneumonia) 02/15/2021   CHF (congestive heart failure) (HCC)    Colon adenomas    GERD (gastroesophageal reflux disease)    HCAP (healthcare-associated pneumonia) 02/02/2021   Hypertension    Hypothyroidism    Macular degeneration    Mitral regurgitation    Pulmonary embolism (HCC) 02/2016   Severe sepsis (HCC) 02/02/2021   Stroke (HCC) 10/2020   SVT (supraventricular tachycardia) (HCC)     Past Surgical History:  Procedure Laterality Date   APPENDECTOMY  1960   BREAST CYST ASPIRATION Left 2017   CARDIOVERSION N/A 04/05/2016   Procedure: Cardioversion;  Surgeon: Wolm JINNY Rhyme, MD;  Location: [facility] ORS;  Service: Cardiovascular;  Laterality: N/A;   CHOLECYSTECTOMY  1985   ESOPHAGOGASTRODUODENOSCOPY N/A 03/10/2021   Procedure: ESOPHAGOGASTRODUODENOSCOPY (EGD);  Surgeon:  Toledo, Ladell POUR, MD;  Location: [facility] ENDOSCOPY;  Service: Gastroenterology;  Laterality: N/A;   ESOPHAGOGASTRODUODENOSCOPY (EGD) WITH PROPOFOL  N/A 11/27/2019   Procedure: ESOPHAGOGASTRODUODENOSCOPY (EGD) WITH PROPOFOL ;  Surgeon: Maryruth Ole DASEN, MD;  Location: [facility] ENDOSCOPY;  Service: Endoscopy;  Laterality: N/A;   ESOPHAGOGASTRODUODENOSCOPY (EGD) WITH PROPOFOL  N/A 12/24/2019   Procedure: ESOPHAGOGASTRODUODENOSCOPY (EGD) WITH PROPOFOL ;  Surgeon: Maryruth Ole DASEN, MD;  Location: [facility] ENDOSCOPY;  Service: Endoscopy;  Laterality: N/A;   IR CT HEAD LTD  11/01/2020   IR PERCUTANEOUS ART THROMBECTOMY/INFUSION INTRACRANIAL INC DIAG ANGIO  11/01/2020   IR US  GUIDE VASC ACCESS RIGHT  11/02/2020   OVARIAN CYST REMOVAL     PACEMAKER LEADLESS INSERTION N/A 11/16/2021   Procedure: PACEMAKER LEADLESS INSERTION;  Surgeon: Ammon Blunt, MD;  Location: [facility];  Service: Cardiovascular;  Laterality: N/A;   RADIOLOGY WITH ANESTHESIA N/A 11/01/2020   Procedure: RADIOLOGY WITH ANESTHESIA;  Surgeon: Dolphus Carrion, MD;  Location: [facility] OR;  Service: Radiology;  Laterality: N/A;   TEE WITHOUT CARDIOVERSION N/A 02/01/2018   Procedure: TRANSESOPHAGEAL ECHOCARDIOGRAM (TEE);  Surgeon: Rhyme Wolm JINNY, MD;  Location: [facility] ORS;  Service: Cardiovascular;  Laterality: N/A;    MEDICATIONS:  Prior to Admission medications   Medication Sig Start Date End Date Taking? Authorizing Provider  acetaminophen  (TYLENOL ) 500 MG tablet Take 500 mg by mouth every 6 (six) hours as needed.    [provider]  furosemide  (LASIX ) 20 MG tablet Take 2 tablets (40 mg total) by mouth daily. FOR  5 DAYS,  THEN ONCE DAILY AS DIRECTED 03/22/23   Marylynn Verneita CROME, MD  levalbuterol  (XOPENEX ) 0.63 MG/3ML nebulizer solution USE 1  VIAL IN NEBULIZER EVERY 4 HOURS AS NEEDED FOR WHEEZING AND FOR SHORTNESS OF BREATH 12/12/22   [name], MD  levothyroxine  (SYNTHROID ) 50 MCG tablet Take 1 tablet (50 mcg  total) by mouth daily before breakfast. 03/22/23   Marylynn Verneita CROME, MD  metoprolol  succinate (TOPROL -XL) 25 MG 24 hr tablet Take 1 tablet (25 mg total) by mouth 2 (two) times daily. 11/29/22   Barbarann Nest, MD  Probiotic CHEW Chew 2 tablets by mouth daily.    [provider]  traZODone  (DESYREL ) 50 MG tablet Take 0.5-1 tablets (25-50 mg total) by mouth at bedtime as needed for sleep. 01/06/23   Marylynn Verneita CROME, MD  XARELTO  15 MG TABS tablet TAKE 1 TABLET EVERY DAY 03/10/23   Marylynn Verneita CROME, MD    Physical Exam   Triage Vital Signs: ED Triage Vitals  Encounter Vitals Group     BP 03/22/23 1719 (!) 137/97     Systolic BP Percentile --      Diastolic BP Percentile --      Pulse Rate 03/22/23 1719 70     Resp 03/22/23 1719 16     Temp 03/22/23 1719 (!) 101 F (38.3 C)     Temp Source 03/22/23 1719 Oral     SpO2 03/22/23 1719 94 %     Weight 03/22/23 1717 116 lb 13.5 oz (53 kg)     Height --      Head Circumference --      Peak Flow --      Pain Score 03/22/23 1717 0     Pain Loc --      Pain Education --      Exclude from Growth Chart --     Most recent vital signs: Vitals:   03/23/23 0319 03/23/23 0330  BP:  (!) 176/76  Pulse:  69  Resp:  (!) 26  Temp: 98.9 F (37.2 C)   SpO2:  90%    CONSTITUTIONAL: Alert, responds appropriately to questions.  Elderly, oriented x 3 HEAD: Normocephalic, atraumatic EYES: Conjunctivae clear, pupils appear equal, sclera nonicteric ENT: normal nose; moist mucous membranes NECK: Supple, normal ROM CARD: irregular; S1 and S2 appreciated RESP: Normal chest excursion without splinting or tachypnea; breath sounds clear and equal bilaterally; no wheezes, no rhonchi, no rales, no hypoxia or respiratory distress, speaking full sentences ABD/GI: Non-distended; soft, non-tender, no rebound, no guarding, no peritoneal signs BACK: The back appears normal EXT: Normal ROM in all joints; no deformity noted, no edema SKIN: Normal color for age  and race; warm; no rash on exposed skin NEURO: Moves all extremities equally, normal speech, no facial asymmetry PSYCH: The patient's mood and manner are appropriate.   ED Results / Procedures / Treatments   LABS: (all labs ordered are listed, but only abnormal results are displayed) Labs Reviewed  RESP PANEL BY RT-PCR (RSV, FLU A&B, COVID)  RVPGX2 - Abnormal; Notable for the following components:      Result Value   SARS Coronavirus 2 by RT PCR POSITIVE (*)    All other components within normal limits  COMPREHENSIVE METABOLIC PANEL - Abnormal; Notable for the following components:   Glucose, Bld 120 (*)    Creatinine, Ser 1.04 (*)    Total Bilirubin 1.3 (*)    GFR, Estimated 51 (*)    All other components  within normal limits  CBC - Abnormal; Notable for the following components:   RDW 15.9 (*)    All other components within normal limits  URINALYSIS, ROUTINE W REFLEX MICROSCOPIC - Abnormal; Notable for the following components:   Color, Urine YELLOW (*)    APPearance CLEAR (*)    Hgb urine dipstick SMALL (*)    Ketones, ur 5 (*)    Protein, ur >=300 (*)    Bacteria, UA RARE (*)    All other components within normal limits  TROPONIN I (HIGH SENSITIVITY) - Abnormal; Notable for the following components:   Troponin I (High Sensitivity) 32 (*)    All other components within normal limits  TROPONIN I (HIGH SENSITIVITY) - Abnormal; Notable for the following components:   Troponin I (High Sensitivity) 45 (*)    All other components within normal limits  BASIC METABOLIC PANEL  CBC     EKG:    Date: 03/23/2023 2:51 AM  Rate: 73  Rhythm: a fib  QRS Axis: normal  Intervals: normal  ST/T Wave abnormalities: normal  Conduction Disutrbances: none  Narrative Interpretation: a fib, bifascicular block, artifact, no sig change compared to prior     RADIOLOGY: My personal review and interpretation of imaging: Chest x-ray shows opacities that were previously seen in  January.  I have personally reviewed all radiology reports.   CT HEAD WO CONTRAST ( ) Result Date: 03/23/2023 CLINICAL DATA:  Altered mental status EXAM: CT HEAD WITHOUT CONTRAST TECHNIQUE: Contiguous axial images were obtained from the base of the skull through the vertex without intravenous contrast. RADIATION DOSE REDUCTION: This exam was performed according to the departmental dose-optimization program which includes automated exposure control, adjustment of the mA and/or kV according to patient size and/or use of iterative reconstruction technique. COMPARISON:  None Available. FINDINGS: Brain: There is no mass, hemorrhage or extra-axial collection. There is generalized atrophy without lobar predilection. Hypodensity of the white matter is most commonly associated with chronic microvascular disease. Vascular: No hyperdense vessel or unexpected vascular calcification. Skull: The visualized skull base, calvarium and extracranial soft tissues are normal. Sinuses/Orbits: No fluid levels or advanced mucosal thickening of the visualized paranasal sinuses. No mastoid or middle ear effusion. Normal orbits. Other: None. IMPRESSION: 1. No acute intracranial abnormality. 2. Generalized atrophy and findings of chronic microvascular disease. Electronically Signed   By: Franky Stanford M.D.   On: 03/23/2023 03:12   DG Chest Port 1 View Result Date: 03/22/2023 CLINICAL DATA:  Cough EXAM: PORTABLE CHEST 1 VIEW COMPARISON:  03/14/2023 FINDINGS: Moderate cardiomegaly with multifocal interstitial opacity, unchanged. No pleural effusion or pneumothorax. No focal airspace consolidation. IMPRESSION: Moderate cardiomegaly with multifocal interstitial opacity, unchanged and likely chronic. Electronically Signed   By: Franky Stanford M.D.   On: 03/22/2023 22:36     PROCEDURES:  Critical Care performed: No     .1-3 Lead EKG Interpretation  Performed by: Lashun Ramseyer, Josette SAILOR, DO Authorized by: Dewitt Judice, Josette SAILOR, DO      Interpretation: normal     ECG rate:  58   ECG rate assessment: normal     Rhythm: sinus rhythm     Ectopy: none     Conduction: normal       IMPRESSION / MDM / ASSESSMENT AND PLAN / ED COURSE  I reviewed the triage vital signs and the nursing notes.    Patient here with cough, vomiting, intermittent confusion, weakness.  The patient is on the cardiac monitor to evaluate for evidence of arrhythmia and/or  significant heart rate changes.   DIFFERENTIAL DIAGNOSIS (includes but not limited to):   Viral URI, pneumonia, UTI, dehydration, anemia, electrolyte derangement, intracranial hemorrhage, stroke, meningitis, encephalitis   Patient's presentation is most consistent with acute presentation with potential threat to life or bodily function.   PLAN: Patient had a fever of 101 on arrival but otherwise hemodynamically stable.  Workup initiated from triage.  Normal white blood cell count, hemoglobin.  Normal electrolytes.  Troponins elevated and rising slightly.  She is not having any chest pain or shortness of breath.  Will repeat EKG.  Urine shows small amount of blood but no other sign of infection.  Patient is positive for COVID-19.  Flu and RSV negative.  This is likely the cause of her weakness and altered mental status.  Chest x-ray shows multifocal interstitial opacities but these have been there previously and radiology suspects they are chronic.  She is not hypoxic here.  Daughter reports her mental status is waxing.  She is currently alert and oriented x 3 without focal neuro deficits.   MEDICATIONS GIVEN IN ED: Medications  acetaminophen  (TYLENOL ) 160 MG/5ML suspension (  Not Given 03/22/23 1730)  traZODone  (DESYREL ) tablet 25-50 mg (has no administration in time range)  levothyroxine  (SYNTHROID ) tablet 50 mcg (50 mcg Oral Given 03/23/23 0552)  acidophilus (RISAQUAD) capsule 1 capsule (has no administration in time range)  albuterol  (PROVENTIL ) (2.5 MG/3ML) 0.083% nebulizer  solution 2.5 mg (has no administration in time range)  enoxaparin  (LOVENOX ) injection 40 mg (has no administration in time range)  0.9 %  sodium chloride  infusion ( Intravenous New Bag/Given 03/23/23 0552)  acetaminophen  (TYLENOL ) tablet 650 mg (has no administration in time range)    Or  acetaminophen  (TYLENOL ) suppository 650 mg (has no administration in time range)  magnesium  hydroxide (MILK OF MAGNESIA) suspension 30 mL (has no administration in time range)  metoCLOPramide  (REGLAN ) injection 10 mg (has no administration in time range)  predniSONE  (DELTASONE ) tablet 20 mg (has no administration in time range)  furosemide  (LASIX ) tablet 40 mg (has no administration in time range)    Followed by  furosemide  (LASIX ) tablet 20 mg (has no administration in time range)  acetaminophen  (TYLENOL ) tablet 650 mg (650 mg Oral Not Given 03/22/23 1729)  metoprolol  succinate (TOPROL -XL) 24 hr tablet 25 mg (25 mg Oral Given 03/23/23 0315)  Rivaroxaban  (XARELTO ) tablet 15 mg (15 mg Oral Given 03/23/23 0314)  methylPREDNISolone  sodium succinate (SOLU-MEDROL ) 125 mg/2 mL injection 125 mg (125 mg Intravenous Given 03/23/23 0315)     ED COURSE:  EKG shows a fib, rate controlled.  No new ischemic change.   CONSULTS:  Consulted and discussed patient's case with hospitalist, Dr. Lawence.  I have recommended admission and consulting physician agrees and will place admission orders.  Patient (and family if present) agree with this plan.   I reviewed all nursing notes, vitals, pertinent previous records.  All labs, EKGs, imaging ordered have been independently reviewed and interpreted by myself.    OUTSIDE RECORDS REVIEWED: Reviewed PCP note from yesterday.       FINAL CLINICAL IMPRESSION(S) / ED DIAGNOSES   Final diagnoses:  Encephalopathy due to COVID-19 virus  Generalized weakness  Vomiting in adult     Rx / DC Orders   ED Discharge Orders     None        Note:  This document was prepared using  Dragon voice recognition software and may include unintentional dictation errors.   Alexyss Balzarini, Josette SAILOR, DO  03/23/23 0639  

## 2023-03-23 NOTE — ED Notes (Signed)
This tech assisted pt to the restroom.

## 2023-03-23 NOTE — ED Notes (Addendum)
 Hospitalist messaged due to patient medications not accurate per what she takes at home. Verifying pt is not to be on xarelto  at this time, and checking if patient needs to be on metoprolol . Per pt - provider changed one of her medication due to an interaction with paxlovid , verifying that with hospitalist. See orders for further details. Per provider pt is off xarelto  and switched to lovenox  due to possible interaction with paxlovid . This RN inquiring about pt hypertension due to minimal response to treatments at this time

## 2023-03-23 NOTE — Progress Notes (Signed)
 Heart Failure Navigator Progress Note  Assessed for Heart & Vascular TOC clinic readiness.  Patient does not meet criteria due to current [facility] patient of Marcina Millard, MD.   Navigator will sign off at this time.  Roxy Horseman, RN, BSN [facility] Heart Failure Navigator Secure Chat Only

## 2023-03-23 NOTE — ED Notes (Signed)
 PT given pills crushed in applesauce, pt having some gagging after eating them in applesauce. Pt states she would prefer to eat a cookie then have her pills cut into 4 pieces. No cookies at this time appropriate for patient request

## 2023-03-23 NOTE — ED Notes (Signed)
 Assisted patient to the toilet x2. Pt did have a bowel movement in her brief, new brief placed and pt assisted back to bed.

## 2023-03-23 NOTE — Consult Note (Signed)
 PHARMACY - ANTICOAGULATION CONSULT NOTE  Pharmacy Consult for Lovenox  Indication: atrial fibrillation  Patient Measurements: Weight: 53 kg (116 lb 13.5 oz)  Labs: Recent Labs    03/22/23 1722 03/22/23 2316 03/23/23 0630  HGB 12.5  --  11.2*  HCT 39.4  --  35.0*  PLT 269  --  201  CREATININE 1.04*  --  0.88  TROPONINIHS 32* 45*  --     Estimated Creatinine Clearance: 35.1 mL/min (by C-G formula based on SCr of 0.88 mg/dL).  Medical History: Past Medical History:  Diagnosis Date   Arthritis    Atrial fibrillation (HCC)    Benign breast cyst in female, left 10/07/2016   Candida esophagitis (HCC) 05/03/2021   Found on Mar 10 2021 EGD    CAP (community acquired pneumonia) 02/15/2021   CHF (congestive heart failure) (HCC)    Colon adenomas    GERD (gastroesophageal reflux disease)    HCAP (healthcare-associated pneumonia) 02/02/2021   Hypertension    Hypothyroidism    Macular degeneration    Mitral regurgitation    Pulmonary embolism (HCC) 02/2016   Severe sepsis (HCC) 02/02/2021   Stroke (HCC) 10/2020   SVT (supraventricular tachycardia) (HCC)     Medications:  Xarelto  15 mg daily prior to admission, last dose was administered inpatient 03/23/23 at 0314  Assessment: 89 y/o F with medical history as above and including Afib / hx PE / Hx CVA on Xarelto . She is here with hypertensive urgency and COVID-19 infection. Pharmacy consulted for Lovenox  dosing for Afib.  Plan:  --Start Lovenox  50 mg (1 mg/kg) tomorrow AM --CBC / Scr per protocol --F/u transition back to home Xarelto  as appropriate  Marolyn KATHEE Mare 03/23/2023,9:12 AM

## 2023-03-23 NOTE — Progress Notes (Signed)
   03/23/23 1000  Spiritual Encounters  Type of Visit Initial  Care provided to: Patient  Referral source Chaplain assessment  Reason for visit Routine spiritual support  OnCall Visit No  Interventions  Spiritual Care Interventions Made Established relationship of care and support  Intervention Outcomes  Outcomes Connection to spiritual care  Spiritual Care Plan  Spiritual Care Issues Still Outstanding No further spiritual care needs at this time (see row info)

## 2023-03-23 NOTE — H&P (Signed)
 History and Physical    Frances Kent FMW:[id number] DOB: 07/19/1930 DOA: 03/23/2023  PCP: Marylynn Verneita CROME, MD (Confirm with patient/family/NH records and if not entered, this has to be entered at [facility] point of entry) Patient coming from: Home  I have personally briefly reviewed patient's old medical records in [facility]  Chief Complaint: SOB, cough  HPI: Frances Kent is a 89 y.o. female with medical history significant of chronic combined HFrEF and HFpEF with LVEF 40-45%, HTN, presented with cough and shortness of breath and leg swelling.  Symptoms started about 1 week ago, patient started to notice swelling of the bilateral lower extremities, 2 days ago patient started to have dry cough as well as increasing shortness of breath.  Denies any chest pain no fever or chills.  Denied any orthopnea.  Went to see PCP yesterday who increased her Lasix  from 1 time a day to 2 times a day.  Patient denied any sore throat abdominal pain nausea vomiting or diarrhea but family does report patient has significant decreased of oral intake since yesterday.  ED Course: Fever 101F, borderline bradycardia blood pressure elevated SBP in the 160-170 range.  O2 saturation 92% on room air.  Tachypneic breathing rate 26-30.  Chest x-ray showed pulmonary congestion and small bilateral pleural effusion.  Blood work showed WBC 6.3 with normal differential creatinine 0.8 glucose 114.  COVID tested positive.  Patient was given IV Solu-Medrol  and breathing treatment x 1 in the ED.  Review of Systems: As per HPI otherwise 14 point review of systems negative.    Past Medical History:  Diagnosis Date   Arthritis    Atrial fibrillation (HCC)    Benign breast cyst in female, left 10/07/2016   Candida esophagitis (HCC) 05/03/2021   Found on Mar 10 2021 EGD    CAP (community acquired pneumonia) 02/15/2021   CHF (congestive heart failure) (HCC)    Colon adenomas    GERD (gastroesophageal reflux disease)     HCAP (healthcare-associated pneumonia) 02/02/2021   Hypertension    Hypothyroidism    Macular degeneration    Mitral regurgitation    Pulmonary embolism (HCC) 02/2016   Severe sepsis (HCC) 02/02/2021   Stroke (HCC) 10/2020   SVT (supraventricular tachycardia) (HCC)     Past Surgical History:  Procedure Laterality Date   APPENDECTOMY  1960   BREAST CYST ASPIRATION Left 2017   CARDIOVERSION N/A 04/05/2016   Procedure: Cardioversion;  Surgeon: Wolm JINNY Rhyme, MD;  Location: [facility] ORS;  Service: Cardiovascular;  Laterality: N/A;   CHOLECYSTECTOMY  1985   ESOPHAGOGASTRODUODENOSCOPY N/A 03/10/2021   Procedure: ESOPHAGOGASTRODUODENOSCOPY (EGD);  Surgeon: Toledo, Ladell POUR, MD;  Location: [facility] ENDOS[facility];  Service: Gastroenterology;  Laterality: N/A;   ESOPHAGOGASTRODUODENOSCOPY (EGD) WITH PROPOFOL  N/A 11/27/2019   Procedure: ESOPHAGOGASTRODUODENOSCOPY (EGD) WITH PROPOFOL ;  Surgeon: Maryruth Ole DASEN, MD;  Location: [facility] ENDOSCOPY;  Service: Endoscopy;  Laterality: N/A;   ESOPHAGOGASTRODUODENOSCOPY (EGD) WITH PROPOFOL  N/A 12/24/2019   Procedure: ESOPHAGOGASTRODUODENOSCOPY (EGD) WITH PROPOFOL ;  Surgeon: Maryruth Ole DASEN, MD;  Location: [facility] END[facility];  Service: Endoscopy;  Laterality: N/A;   IR CT HEAD LTD  11/01/2020   IR PERCUTANEOUS ART THROMBECTOMY/INFUSION INTRACRANIAL INC DIAG ANGIO  11/01/2020   IR US  GUIDE VASC ACCESS RIGHT  11/02/2020   OVARIAN CYST REMOVAL     PACEMAKER LEADLESS INSERTION N/A 11/16/2021   Procedure: PACEMAKER LEADLESS INSERTION;  Surgeon: Ammon Blunt, MD;  Location: [facility]IVE [facility];  Service: Cardiovascular;  Laterality: N/A;   RADIOLOGY WITH ANESTHESIA  N/A 11/01/2020   Procedure: RADIOLOGY WITH ANESTHESIA;  Surgeon: Dolphus Carrion, MD;  Location: [facility];  Service: Radiology;  Laterality: N/A;   TEE WITHOUT CARDIOVERSION N/A 02/01/2018   Procedure: TRANSESOPHAGEAL ECHOCARDIOGRAM (TEE);  Surgeon: Hester Wolm PARAS, MD;  Location: [facility] OR[facility];   Service: Cardiovascular;  Laterality: N/A;     reports that she has never smoked. She has never used smokeless tobacco. She reports that she does not drink alcohol and does not use drugs.  Allergies  Allergen Reactions   Statins     Severe myalgias    Family History  Problem Relation Age of Onset   Cancer Mother 5       breast cancer, lived to 73,    Breast cancer Mother 70   Cancer Son 43       pancreatic cancer   Heart disease Son        CAD, Tobacco Abuse    Heart disease Maternal Grandmother 45       died of massive MI   Stroke Sister      Prior to Admission medications   Medication Sig Start Date End Date Taking? Authorizing Provider  acetaminophen  (TYLENOL ) 500 MG tablet Take 500 mg by mouth every 6 (six) hours as needed.    [provider]  furosemide  (LASIX ) 20 MG tablet Take 2 tablets (40 mg total) by mouth daily. FOR 5 DAYS,  THEN ONCE DAILY AS DIRECTED 03/22/23   Marylynn Verneita CROME, MD  levalbuterol  (XOPENEX ) 0.63 MG/3ML nebulizer solution USE 1  VIAL IN NEBULIZER EVERY 4 HOURS AS NEEDED FOR WHEEZING AND FOR SHORTNESS OF BREATH 12/12/22   Frances Dedra CROME, MD  levothyroxine  (SYNTHROID ) 50 MCG tablet Take 1 tablet (50 mcg total) by mouth daily before breakfast. 03/22/23   Marylynn Verneita CROME, MD  metoprolol  succinate (TOPROL -XL) 25 MG 24 hr tablet Take 1 tablet (25 mg total) by mouth 2 (two) times daily. 11/29/22   Barbarann Nest, MD  Probiotic CHEW Chew 2 tablets by mouth daily.    [provider]  traZODone  (DESYREL ) 50 MG tablet Take 0.5-1 tablets (25-50 mg total) by mouth at bedtime as needed for sleep. 01/06/23   Marylynn Verneita CROME, MD  XARELTO  15 MG TABS tablet TAKE 1 TABLET EVERY DAY 03/10/23   Marylynn Verneita CROME, MD    Physical Exam: Vitals:   03/23/23 0330 03/23/23 0530 03/23/23 0630 03/23/23 0700  BP: (!) 176/76 (!) 168/71 (!) 175/77 (!) 177/77  Pulse: 69 (!) 55 65 63  Resp: (!) 26 (!) 33 (!) 24 (!) 30  Temp:      TempSrc:      SpO2: 90% 94% 90%    Weight:        Constitutional: NAD, calm, comfortable Vitals:   03/23/23 0330 03/23/23 0530 03/23/23 0630 03/23/23 0700  BP: (!) 176/76 (!) 168/71 (!) 175/77 (!) 177/77  Pulse: 69 (!) 55 65 63  Resp: (!) 26 (!) 33 (!) 24 (!) 30  Temp:      TempSrc:      SpO2: 90% 94% 90%   Weight:       Eyes: PERRL, lids and conjunctivae normal ENMT: Mucous membranes are moist. Posterior pharynx clear of any exudate or lesions.Normal dentition.  Neck: normal, supple, no masses, no thyromegaly.  JVD about 6 cm above the clavicles Respiratory: clear to auscultation bilaterally, scattered wheezing, bilateral crackles to the mid levels, increasing respiratory effort. No accessory muscle use.  Cardiovascular: Regular rate and rhythm, no murmurs /  rubs / gallops. 2+ extremity edema. 2+ pedal pulses. No carotid bruits.  Abdomen: no tenderness, no masses palpated. No hepatosplenomegaly. Bowel sounds positive.  Musculoskeletal: no clubbing / cyanosis. No joint deformity upper and lower extremities. Good ROM, no contractures. Normal muscle tone.  Skin: no rashes, lesions, ulcers. No induration Neurologic: CN 2-12 grossly intact. Sensation intact, DTR normal. Strength 5/5 in all 4.  Psychiatric: Normal judgment and insight. Alert and oriented x 3. Normal mood.     Labs on Admission: I have personally reviewed following labs and imaging studies  CBC: Recent Labs  Lab 03/22/23 1722 03/23/23 0630  WBC 6.3 5.7  HGB 12.5 11.2*  HCT 39.4 35.0*  MCV 90.2 90.9  PLT 269 201   Basic Metabolic Panel: Recent Labs  Lab 03/22/23 1722 03/23/23 0630  NA 141 137  K 3.9 3.7  CL 104 105  CO2 26 23  GLUCOSE 120* 114*  BUN 11 9  CREATININE 1.04* 0.88  CALCIUM  9.3 9.0   GFR: Estimated Creatinine Clearance: 35.1 mL/min (by C-G formula based on SCr of 0.88 mg/dL). Liver Function Tests: Recent Labs  Lab 03/22/23 1722  AST 24  ALT 19  ALKPHOS 71  BILITOT 1.3*  PROT 7.6  ALBUMIN  4.3   No results for  input(s): LIPASE, AMYLASE in the last 168 hours. No results for input(s): AMMONIA in the last 168 hours. Coagulation Profile: No results for input(s): INR, PROTIME in the last 168 hours. Cardiac Enzymes: No results for input(s): CKTOTAL, CKMB, CKMBINDEX, TROPONINI in the last 168 hours. BNP (last 3 results) No results for input(s): PROBNP in the last 8760 hours. HbA1C: No results for input(s): HGBA1C in the last 72 hours. CBG: No results for input(s): GLUCAP in the last 168 hours. Lipid Profile: No results for input(s): CHOL, HDL, LDLCALC, TRIG, CHOLHDL, LDLDIRECT in the last 72 hours. Thyroid  Function Tests: No results for input(s): TSH, T4TOTAL, FREET4, T3FREE, THYROIDAB in the last 72 hours. Anemia Panel: No results for input(s): VITAMINB12, FOLATE, FERRITIN, TIBC, IRON, RETICCTPCT in the last 72 hours. Urine analysis:    Component Value Date/Time   COLORURINE YELLOW (A) 03/22/2023 2301   APPEARANCEUR CLEAR (A) 03/22/2023 2301   APPEARANCEUR Clear 02/05/2013 1630   LABSPEC 1.025 03/22/2023 2301   LABSPEC 1.009 02/05/2013 1630   PHURINE 6.0 03/22/2023 2301   GLUCOSEU NEGATIVE 03/22/2023 2301   GLUCOSEU NEGATIVE 06/01/2022 1521   HGBUR SMALL (A) 03/22/2023 2301   BILIRUBINUR NEGATIVE 03/22/2023 2301   BILIRUBINUR 1 08/31/2017 1157   BILIRUBINUR Negative 02/05/2013 1630   KETONESUR 5 (A) 03/22/2023 2301   PROTEINUR >=300 (A) 03/22/2023 2301   UROBILINOGEN 0.2 06/01/2022 1521   NITRITE NEGATIVE 03/22/2023 2301   LEUKOCYTESUR NEGATIVE 03/22/2023 2301   LEUKOCYTESUR 1+ 02/05/2013 1630    Radiological Exams on Admission: DG Chest 1 View Result Date: 03/23/2023 CLINICAL DATA:  89 year old female with altered mental status, CHF. EXAM: CHEST  1 VIEW COMPARISON:  Portable chest yesterday and earlier. FINDINGS: Portable AP semi upright view at 0900 hours. Mildly improved lung volumes. Stable cardiomegaly and mediastinal  contours. Left chest loop recorder or superficial ICD redemonstrated. Small pleural effusions are less apparent, lung base ventilation mildly improved. Stable pulmonary vascularity which has regressed since last month. No pneumothorax. No areas of worsening ventilation. Stable visualized osseous structures. Negative visible bowel gas. IMPRESSION: Cardiomegaly with mildly improved lung volumes and lung base ventilation. Suspected small pleural effusions less apparent. Stable vascular congestion, improved from last month. Electronically Signed  By: VEAR Hurst M.D.   On: 03/23/2023 09:20   CT HEAD WO CONTRAST ( ) Result Date: 03/23/2023 CLINICAL DATA:  Altered mental status EXAM: CT HEAD WITHOUT CONTRAST TECHNIQUE: Contiguous axial images were obtained from the base of the skull through the vertex without intravenous contrast. RADIATION DOSE REDUCTION: This exam was performed according to the departmental dose-optimization program which includes automated exposure control, adjustment of the mA and/or kV according to patient size and/or use of iterative reconstruction technique. COMPARISON:  None Available. FINDINGS: Brain: There is no mass, hemorrhage or extra-axial collection. There is generalized atrophy without lobar predilection. Hypodensity of the white matter is most commonly associated with chronic microvascular disease. Vascular: No hyperdense vessel or unexpected vascular calcification. Skull: The visualized skull base, calvarium and extracranial soft tissues are normal. Sinuses/Orbits: No fluid levels or advanced mucosal thickening of the visualized paranasal sinuses. No mastoid or middle ear effusion. Normal orbits. Other: None. IMPRESSION: 1. No acute intracranial abnormality. 2. Generalized atrophy and findings of chronic microvascular disease. Electronically Signed   By: Franky Stanford M.D.   On: 03/23/2023 03:12   DG Chest Port 1 View Result Date: 03/22/2023 CLINICAL DATA:  Cough EXAM: PORTABLE CHEST  1 VIEW COMPARISON:  03/14/2023 FINDINGS: Moderate cardiomegaly with multifocal interstitial opacity, unchanged. No pleural effusion or pneumothorax. No focal airspace consolidation. IMPRESSION: Moderate cardiomegaly with multifocal interstitial opacity, unchanged and likely chronic. Electronically Signed   By: Franky Stanford M.D.   On: 03/22/2023 22:36    EKG: Independently reviewed.  Rate controlled A-fib, no acute ST changes.  Chronic T wave inversions on multiple leads  Assessment/Plan Principal Problem:   Encephalopathy due to COVID-19 virus Active Problems:   CHF (congestive heart failure) (HCC)  (please populate well all problems here in Problem List. (For example, if patient is on BP meds at home and you resume or decide to hold them, it is a problem that needs to be her. Same for CAD, COPD, HLD and so on)  Acute hypoxic respiratory failure -Evidenced by breathing rate more than 30 -Etiology considered to be a combination of effect from acute COVID infection as well as acute on chronic combined HFrEF and HFpEF decompensation  Acute on chronic HFrEF and HFpEF decompensated -Significant fluid overload, with JVD and peripheral edema -Start IV Lasix  40 mg daily -Straight I&O and repeat x-ray tomorrow  COVID-19 infection -Given patient has fever and respiratory failure as well as baseline risk factor including CHF and other comorbidities, decided to start antiviral treatment with Paxlovid . -IV Solu-Medrol  bridging for p.o. prednisone  -Incentive spirometry and flutter valve -Low suspicion for concurrent bacterial infection at this point, we will check procalcitonin level. -As per patient and family, she has low risk of aspiration  SIRS -Secondary to COVID-19 infection, management as above  PAF -Rate controlled A-fib, continue metoprolol  -As patient will be on Paxlovid , we will change Xarelto  to Lovenox  subcu  HTN, uncontrolled -Continue metoprolol  -Add as needed  hydralazine   Hypothyroidism -Continue Synthroid   Deconditioning -PT evaluation  DVT prophylaxis: Lovenox  twice daily Code Status: Full code Family Communication: Daughter over the phone Disposition Plan: Patient is sick with CHF decompensation as well as COVID-19 infection with acute hypoxic, requiring IV Lasix  and antiviral treatment and IV Solu-Medrol , expect more than 2 midnight hospital stay Consults called: None Admission status: Tele admit   Cort ONEIDA Mana MD [facility] Pager [phone]  03/23/2023, 10:28 AM

## 2023-03-24 ENCOUNTER — Encounter: Payer: Self-pay | Admitting: Internal Medicine

## 2023-03-24 DIAGNOSIS — G9349 Other encephalopathy: Secondary | ICD-10-CM | POA: Diagnosis not present

## 2023-03-24 DIAGNOSIS — U071 COVID-19: Secondary | ICD-10-CM | POA: Diagnosis not present

## 2023-03-24 LAB — BASIC METABOLIC PANEL
Anion gap: 8 (ref 5–15)
BUN: 16 mg/dL (ref 8–23)
CO2: 28 mmol/L (ref 22–32)
Calcium: 8.4 mg/dL — ABNORMAL LOW (ref 8.9–10.3)
Chloride: 100 mmol/L (ref 98–111)
Creatinine, Ser: 0.96 mg/dL (ref 0.44–1.00)
GFR, Estimated: 56 mL/min — ABNORMAL LOW (ref >60)
Glucose, Bld: 138 mg/dL — ABNORMAL HIGH (ref 70–99)
Potassium: 3.6 mmol/L (ref 3.5–5.1)
Sodium: 136 mmol/L (ref 135–145)

## 2023-03-24 MED ORDER — PREDNISONE 20 MG PO TABS: 40.0000 mg | ORAL_TABLET | Freq: Every day | ORAL | Status: DC

## 2023-03-24 MED ORDER — METHYLPREDNISOLONE SODIUM SUCC 40 MG IJ SOLR: 20.0000 mg | Freq: Once | INTRAMUSCULAR | Status: DC

## 2023-03-24 MED ORDER — METHYLPREDNISOLONE SODIUM SUCC 40 MG IJ SOLR: 40.0000 mg | Freq: Every day | INTRAMUSCULAR | Status: DC

## 2023-03-24 NOTE — Progress Notes (Signed)
  Progress Note   Date: 03/24/2023  Patient Name: Frances Kent        MRN#: [id number]   Clarification of diagnosis:  Metabolic encephalopathy

## 2023-03-24 NOTE — TOC CM/SW Note (Signed)
 Transition of Care [facility]) - Inpatient Brief Assessment   Patient Details  Name: Frances Kent MRN: [id number] Date of Birth: 07-14-1930  Transition of Care [facility]) CM/SW Contact:    Lauraine JAYSON Carpen, LCSW Phone Number: 03/24/2023, 12:42 PM   Clinical Narrative: CSW reviewed chart. Home health PT is recommended but per PT note, patient has declined. No DME recommendations. Please consult TOC if patient changes her mind.   Transition of Care Asessment: Insurance and Status: Insurance coverage has been reviewed Patient has primary care physician: Yes Home environment has been reviewed: Single family home Prior level of function:: Modified independent Prior/Current Home Services: No current home services Social Drivers of Health Review: SDOH reviewed interventions complete Readmission risk has been reviewed: Yes Transition of care needs: no transition of care needs at this time

## 2023-03-24 NOTE — Discharge Instructions (Signed)
 the [facility] offers a [employer] that anyone can call toll free at [phone]. The friendship line is available 24 hours a day  [facility] is a Program of All-inclusive Care for the Elderly (PACE). Their mission is to promote and sustain the independence of seniors wishing to remain in the community. They provide seniors with comprehensive long-term health, social, medical and dietary care. Their program is a safe alternative to nursing home care. [phone]  [facility] Physical Address [facility] [address] [place], [place] [address] Phone: [phone]. . Online zoom yoga class, connect with others without leaving your home [facility] offers [place] dance cardio sessions for individuals via Zoom. This program provides: - Dance fitness activities Please contact program for more information. Servinganyone in need adults 18+ hiv/aids individuals families Call [phone]  Email [email or web address] [email or web address] to get more info  [employer] offers an online [employer] to individuals where they can receive help to focus on their best health. Whether you're a [employer] member or not, the neighborhood center offers a... Main Serviceshealth education  exercise & fitness  community support services  recreation  virtual support Other Servicessupport groups Servinganyone in need adults young adults teens seniors individuals families [email or web address] to get more info  Schedule on their website  The [facility] offers an array of activities for adults age 27 and over. This program provides:- Fitness and health programs- Tech classes- Activity books Main Serviceshealth education  community support services  exercise & fitness  recreation  more education Servingseniors  Call [phone]    For more resources go online to [email or web address] and type in you zipcode

## 2023-03-24 NOTE — Plan of Care (Signed)
  Problem: Education: Goal: Knowledge of risk factors and measures for prevention of condition will improve Outcome: Progressing   Problem: Coping: Goal: Psychosocial and spiritual needs will be supported Outcome: Progressing   Problem: Clinical Measurements: Goal: Diagnostic test results will improve Outcome: Progressing Goal: Respiratory complications will improve Outcome: Progressing   Problem: Activity: Goal: Risk for activity intolerance will decrease Outcome: Progressing   Problem: Elimination: Goal: Will not experience complications related to urinary retention Outcome: Progressing   Problem: Nutrition: Goal: Adequate nutrition will be maintained Outcome: Progressing   Problem: Safety: Goal: Ability to remain free from injury will improve Outcome: Progressing

## 2023-03-24 NOTE — Progress Notes (Signed)
 PROGRESS NOTE    Frances Kent  FMW:[id number] DOB: 1930-10-17 DOA: 03/23/2023 PCP: Marylynn Verneita CROME, MD  Chief Complaint  Patient presents with   Altered Mental Status    Hospital Course:  Frances Kent is 89 y.o. female with heart failure with reduced EF, hypertension, ocular degeneration, who presented complaining of cough and shortness of breath, as well as lower extremity edema.  She went to see her PCP in the office and her Lasix  was increased from once daily to twice daily.  On arrival to the ED she was found to be septic with tachypnea, fever to 101, and tachycardic.  Chest x-ray revealed pulmonary congestion and small bilateral pleural effusions.  WBC 6.3.  She tested positive for COVID-19, was admitted for further treatment.  Subjective: Reports she is feeling much better this morning.  She endorses significant urination and believes she is less warm.  She is off of oxygen  completely.  Her daughter is at bedside and agrees that she seems to be much improved.   Objective: Vitals:   03/23/23 2330 03/24/23 0336 03/24/23 0400 03/24/23 0700  BP: 135/62 135/62 (!) 111/99 (!) 148/69  Pulse: (!) 54 (!) 51  (!) 56  Resp: 20 17 18 16   Temp:  97.9 F (36.6 C)    TempSrc:      SpO2: 96% 96% 94% 94%  Weight:        Intake/Output Summary (Last 24 hours) at 03/24/2023 9185 Last data filed at 03/23/2023 1117 Gross per 24 hour  Intake 541.67 ml  Output --  Net 541.67 ml   Filed Weights   03/22/23 1717  Weight: 53 kg    Examination: General exam: Appears calm and comfortable, NAD  Respiratory system: No work of breathing, symmetric chest wall expansion, no wheezing, clear to auscultation Cardiovascular system: S1 & S2 heard, RRR.  Gastrointestinal system: Abdomen is nondistended, soft and nontender.  Neuro: Alert and oriented. No focal neurological deficits. Extremities: 1-2+ pitting edema bilaterally Skin: No rashes, lesions Psychiatry: Demonstrates appropriate judgement  and insight. Mood & affect appropriate for situation.   Assessment & Plan:  Principal Problem:   Encephalopathy due to COVID-19 virus Active Problems:   CHF (congestive heart failure) (HCC)    Sepsis - Criteria met on arrival with tachycardia, tachypnea, fever.  Source: COVID-19 - Volume overloaded, avoid additional IV fluids - Procalcitonin unremarkable, symptoms explained by viral illness.  No antibiotics indicated at this time  COVID-19 - Patient is at high risk for respiratory failure with additional comorbidities including her CHF exacerbation.  Has been started on Paxlovid , will continue - Status post IV Solu-Medrol , currently is on room air, no wheezing.  Will discontinue further prednisone . - Continue I-S and FV - Procalcitonin unremarkable, low suspicion for concurrent bacterial infection.  No antibiotics indicated  Acute heart failure exacerbation - Echocardiogram 11/2023: LVEF 40 to 45% diastolic parameters indeterminate.  Severely dilated left atrium, moderately dilated right atrium.  Mild mitral valve stenosis. - Continue with 40 IV Lasix , blood pressures have been soft.  She is unlikely to tolerate more significant diuresis. - Takes Lasix  as needed outpatient.  Has recently had some worsening of her swelling has been taking it on a more daily basis. - Strict I's and O's  Paroxysmal atrial fibrillation Status post pacemaker - Currently on Coreg .  Rate is in the 50s which she reports is her baseline.  She does have a pacemaker. - Continue outpatient follow-up with cardiology, sees [facility] - Patient is on  Paxlovid  so we will hold home dose Xarelto  for now and continue with Lovenox  subcu.  Xarelto  at discharge  Hypertension - Continue metoprolol  - As needed hydralazine   Hypothyroidism - Continue home dose Synthroid   Generalized deconditioning Advanced age - PT/OT evals  DVT prophylaxis: Lovenox    Code Status: Full Code Family Communication: Daughter at bedside, also  Discussed directly with patient Disposition:  Inpatient still hospitalized for IV lasix , tachypnea, will discharge to her home when no longer requiring IV lasix    Consultants:    Procedures:    Antimicrobials:  Anti-infectives (From admission, onward)    Start     Dose/Rate Route Frequency Ordered Stop   03/23/23 1000  nirmatrelvir /ritonavir  (renal dosing) (PAXLOVID ) 2 tablet        2 tablet Oral 2 times daily 03/23/23 0857 03/28/23 0959       Data Reviewed: I have personally reviewed following labs and imaging studies CBC: Recent Labs  Lab 03/22/23 1722 03/23/23 0630  WBC 6.3 5.7  HGB 12.5 11.2*  HCT 39.4 35.0*  MCV 90.2 90.9  PLT 269 201   Basic Metabolic Panel: Recent Labs  Lab 03/22/23 1722 03/23/23 0630  NA 141 137  K 3.9 3.7  CL 104 105  CO2 26 23  GLUCOSE 120* 114*  BUN 11 9  CREATININE 1.04* 0.88  CALCIUM  9.3 9.0   GFR: Estimated Creatinine Clearance: 35.1 mL/min (by C-G formula based on SCr of 0.88 mg/dL). Liver Function Tests: Recent Labs  Lab 03/22/23 1722  AST 24  ALT 19  ALKPHOS 71  BILITOT 1.3*  PROT 7.6  ALBUMIN  4.3   CBG: No results for input(s): GLUCAP in the last 168 hours.  Recent Results (from the past 240 hours)  Resp panel by RT-PCR (RSV, Flu A&B, Covid) Anterior Nasal Swab     Status: Abnormal   Collection Time: 03/22/23  5:22 PM   Specimen: Anterior Nasal Swab  Result Value Ref Range Status   SARS Coronavirus 2 by RT PCR POSITIVE (A) NEGATIVE Final    Comment: (NOTE) SARS-CoV-2 target nucleic acids are DETECTED.  The SARS-CoV-2 RNA is generally detectable in upper respiratory specimens during the acute phase of infection. Positive results are indicative of the presence of the identified virus, but do not rule out bacterial infection or co-infection with other pathogens not detected by the test. Clinical correlation with patient history and other diagnostic information is necessary to determine patient infection  status. The expected result is Negative.  Fact Sheet for Patients: bloggercourse.com  Fact Sheet for Healthcare Providers: seriousbroker.it  This test is not yet approved or cleared by the [place]  FDA and  has been authorized for detection and/or diagnosis of SARS-CoV-2 by FDA under an Emergency Use Authorization (EUA).  This EUA will remain in effect (meaning this test can be used) for the duration of  the COVID-19 declaration under Section 564(b)(1) of the A ct, 21 U.S.C. section 360bbb-3(b)(1), unless the authorization is terminated or revoked sooner.     Influenza A by PCR NEGATIVE NEGATIVE Final   Influenza B by PCR NEGATIVE NEGATIVE Final    Comment: (NOTE) The Xpert Xpress SARS-CoV-2/FLU/RSV plus assay is intended as an aid in the diagnosis of influenza from Nasopharyngeal swab specimens and should not be used as a sole basis for treatment. Nasal washings and aspirates are unacceptable for Xpert Xpress SARS-CoV-2/FLU/RSV testing.  Fact Sheet for Patients: bloggercourse.com  Fact Sheet for Healthcare Providers: seriousbroker.it  This test is not yet approved or cleared  by the [place]  FDA and has been authorized for detection and/or diagnosis of SARS-CoV-2 by FDA under an Emergency Use Authorization (EUA). This EUA will remain in effect (meaning this test can be used) for the duration of the COVID-19 declaration under Section 564(b)(1) of the Act, 21 U.S.C. section 360bbb-3(b)(1), unless the authorization is terminated or revoked.     Resp Syncytial Virus by PCR NEGATIVE NEGATIVE Final    Comment: (NOTE) Fact Sheet for Patients: bloggercourse.com  Fact Sheet for Healthcare Providers: seriousbroker.it  This test is not yet approved or cleared by the [place]  FDA and has been authorized for detection  and/or diagnosis of SARS-CoV-2 by FDA under an Emergency Use Authorization (EUA). This EUA will remain in effect (meaning this test can be used) for the duration of the COVID-19 declaration under Section 564(b)(1) of the Act, 21 U.S.C. section 360bbb-3(b)(1), unless the authorization is terminated or revoked.  Performed at [facility], [address], [place], [place] [address]      Radiology Studies: DG Chest 1 View Result Date: 03/23/2023 CLINICAL DATA:  89 year old female with altered mental status, CHF. EXAM: CHEST  1 VIEW COMPARISON:  Portable chest yesterday and earlier. FINDINGS: Portable AP semi upright view at 0900 hours. Mildly improved lung volumes. Stable cardiomegaly and mediastinal contours. Left chest loop recorder or superficial ICD redemonstrated. Small pleural effusions are less apparent, lung base ventilation mildly improved. Stable pulmonary vascularity which has regressed since last month. No pneumothorax. No areas of worsening ventilation. Stable visualized osseous structures. Negative visible bowel gas. IMPRESSION: Cardiomegaly with mildly improved lung volumes and lung base ventilation. Suspected small pleural effusions less apparent. Stable vascular congestion, improved from last month. Electronically Signed   By: VEAR Hurst M.D.   On: 03/23/2023 09:20   CT HEAD WO CONTRAST ( ) Result Date: 03/23/2023 CLINICAL DATA:  Altered mental status EXAM: CT HEAD WITHOUT CONTRAST TECHNIQUE: Contiguous axial images were obtained from the base of the skull through the vertex without intravenous contrast. RADIATION DOSE REDUCTION: This exam was performed according to the departmental dose-optimization program which includes automated exposure control, adjustment of the mA and/or kV according to patient size and/or use of iterative reconstruction technique. COMPARISON:  None Available. FINDINGS: Brain: There is no mass, hemorrhage or extra-axial collection. There is generalized  atrophy without lobar predilection. Hypodensity of the white matter is most commonly associated with chronic microvascular disease. Vascular: No hyperdense vessel or unexpected vascular calcification. Skull: The visualized skull base, calvarium and extracranial soft tissues are normal. Sinuses/Orbits: No fluid levels or advanced mucosal thickening of the visualized paranasal sinuses. No mastoid or middle ear effusion. Normal orbits. Other: None. IMPRESSION: 1. No acute intracranial abnormality. 2. Generalized atrophy and findings of chronic microvascular disease. Electronically Signed   By: Franky Stanford M.D.   On: 03/23/2023 03:12   DG Chest Port 1 View Result Date: 03/22/2023 CLINICAL DATA:  Cough EXAM: PORTABLE CHEST 1 VIEW COMPARISON:  03/14/2023 FINDINGS: Moderate cardiomegaly with multifocal interstitial opacity, unchanged. No pleural effusion or pneumothorax. No focal airspace consolidation. IMPRESSION: Moderate cardiomegaly with multifocal interstitial opacity, unchanged and likely chronic. Electronically Signed   By: Franky Stanford M.D.   On: 03/22/2023 22:36    Scheduled Meds:  acidophilus  1 capsule Oral Daily   carvedilol   3.125 mg Oral BID WC   enoxaparin  (LOVENOX ) injection  50 mg Subcutaneous Q12H   furosemide   40 mg Intravenous Daily   levothyroxine   50 mcg Oral Q0600   lisinopril   20 mg Oral Daily   methylPREDNISolone  (SOLU-MEDROL ) injection  0.5 mg/kg Intravenous Q12H   Followed by   NOREEN ON 03/26/2023] predniSONE   50 mg Oral Daily   nirmatrelvir /ritonavir  (renal dosing)  2 tablet Oral BID   sodium chloride  flush  3 mL Intravenous Q12H   Continuous Infusions:  sodium chloride        LOS: 1 day    Total time spent coordinating care:   Lorane Poland, DO [facility]  To contact the attending physician between 7A-7P please use Epic Chat. To contact the covering physician during after hours 7P-7A, please review Amion.   03/24/2023, 8:14 AM   *This document  has been created with the assistance of dictation software. Please excuse typographical errors. *

## 2023-03-24 NOTE — Progress Notes (Signed)
  Progress Note   Date: 03/24/2023  Patient Name: Frances Kent        MRN#: [id number]  Clarification of diagnosis:  CKD stage 3b

## 2023-03-24 NOTE — Evaluation (Signed)
 Physical Therapy Evaluation Patient Details Name: Frances Kent MRN: [id number] DOB: May 21, 1930 Today's Date: 03/24/2023  History of Present Illness  Frances Kent is a 89yoF who [facility] ED [facility]/23/23 with AMS, cough, emesis x 1day. Pt at PCP previous day for a FU appointment for edema management and furosemide  dose adjustment. PMH: PAF on A/C, SSS s/p leadless PPM, HTN, mitral stenosis, heart failure with reduced EF, HLD, chronic fatigue, CVA, hypoTSH, GAD. Pt febrile in ED with elevated BP. Pt (+) COVID19.  Clinical Impression  Pt sitting EOB finishing breakfast, DTR visiting. Pt feels remarkably improved since previous day. Pt demonstrates transfers and AMB with smiles and without LOB, noted safe RW use. DTR also impressed at rapid improvement in strength since previous day. Home DC seems appropriate. HHPT services could be helpful, however pt feels she can manage without. WIll follow.       If plan is discharge home, recommend the following: A little help with walking and/or transfers;Supervision due to cognitive status;Help with stairs or ramp for entrance   Can travel by private vehicle        Equipment Recommendations None recommended by PT  Recommendations for Other Services       Functional Status Assessment Patient has had a recent decline in their functional status and demonstrates the ability to make significant improvements in function in a reasonable and predictable amount of time.     Precautions / Restrictions Precautions Precautions: Fall Restrictions Weight Bearing Restrictions Per Provider Order: No      Mobility  Bed Mobility                    Transfers Overall transfer level: Needs assistance Equipment used: Rolling walker (2 wheels) Transfers: Sit to/from Stand Sit to Stand: Supervision           General transfer comment: from guest chair and from high gurney, no assist needed    Ambulation/Gait Ambulation/Gait assistance: Contact  guard assist Gait Distance (Feet): 60 Feet Assistive device: Rolling walker (2 wheels) Gait Pattern/deviations: Step-to pattern       General Gait Details: appears steady, safe use of RW, DTR pleased with quality of gait, pt weak but not quite baseline.  Stairs            Wheelchair Mobility     Tilt Bed    Modified Rankin (Stroke Patients Only)       Balance Overall balance assessment: Modified Independent                                           Pertinent Vitals/Pain Pain Assessment Pain Assessment: No/denies pain    Home Living Family/patient expects to be discharged to:: Private residence Living Arrangements: Alone Available Help at Discharge: Family Type of Home: House Home Access: Level entry       Home Layout: One level;Laundry or work area in [employer] Equipment: Agricultural Consultant (2 wheels);Shower seat;BSC/3in1      Prior Function Prior Level of Function : Independent/Modified Independent             Mobility Comments: Mod I using rolling walker for short distance ambulation around the home ADLs Comments: daughter does laundry. Mod I with ADLs and able to make sandwiches, soups, light meals     Extremity/Trunk Assessment  Communication      Cognition Arousal: Alert Behavior During Therapy: WFL for tasks assessed/performed Overall Cognitive Status: Within Functional Limits for tasks assessed                                          General Comments      Exercises     Assessment/Plan    PT Assessment Patient needs continued PT services  PT Problem List Decreased strength;Decreased activity tolerance;Decreased balance;Decreased mobility;Decreased coordination       PT Treatment Interventions DME instruction;Gait training;Functional mobility training;Therapeutic activities;Therapeutic exercise;Balance training;Neuromuscular re-education;Patient/family education    PT  Goals (Current goals can be found in the Care Plan section)  Acute Rehab PT Goals Patient Stated Goal: regain strength at home PT Goal Formulation: With patient Time For Goal Achievement: 04/07/23 Potential to Achieve Goals: Good    Frequency Min 1X/week     Co-evaluation               AM-PAC PT 6 Clicks Mobility  Outcome Measure Help needed turning from your back to your side while in a flat bed without using bedrails?: A Little Help needed moving from lying on your back to sitting on the side of a flat bed without using bedrails?: A Little Help needed moving to and from a bed to a chair (including a wheelchair)?: A Little Help needed standing up from a chair using your arms (e.g., wheelchair or bedside chair)?: A Little Help needed to walk in hospital room?: A Little Help needed climbing 3-5 steps with a railing? : A Little 6 Click Score: 18    End of Session   Activity Tolerance: Patient tolerated treatment well;No increased pain Patient left: in bed;with call bell/phone within reach;with family/visitor present Nurse Communication: Mobility status PT Visit Diagnosis: Difficulty in walking, not elsewhere classified (R26.2);Other abnormalities of gait and mobility (R26.89);Muscle weakness (generalized) (M62.81)    Time: 9075-9056 PT Time Calculation (min) (ACUTE ONLY): 19 min   Charges:   PT Evaluation $PT Eval Moderate Complexity: 1 Mod   PT General Charges $$ ACUTE PT VISIT: 1 Visit        12:37 PM, 03/24/23 Peggye JAYSON Linear, PT, DPT Physical Therapist - [facility]  Outpatient Physical Therapy- Main Campus [phone]     Kareemah Grounds C 03/24/2023, 12:36 PM

## 2023-03-25 DIAGNOSIS — U071 COVID-19: Secondary | ICD-10-CM | POA: Diagnosis not present

## 2023-03-25 DIAGNOSIS — G9349 Other encephalopathy: Secondary | ICD-10-CM | POA: Diagnosis not present

## 2023-03-25 LAB — CBC
HCT: 33.7 % — ABNORMAL LOW (ref 36.0–46.0)
Hemoglobin: 11.2 g/dL — ABNORMAL LOW (ref 12.0–15.0)
MCH: 28.9 pg (ref 26.0–34.0)
MCHC: 33.2 g/dL (ref 30.0–36.0)
MCV: 86.9 fL (ref 80.0–100.0)
Platelets: 227 10*3/uL (ref 150–400)
RBC: 3.88 MIL/uL (ref 3.87–5.11)
RDW: 15.9 % — ABNORMAL HIGH (ref 11.5–15.5)
WBC: 7.1 10*3/uL (ref 4.0–10.5)
nRBC: 0 % (ref 0.0–0.2)

## 2023-03-25 LAB — BASIC METABOLIC PANEL
Anion gap: 8 (ref 5–15)
BUN: 17 mg/dL (ref 8–23)
CO2: 27 mmol/L (ref 22–32)
Calcium: 8.3 mg/dL — ABNORMAL LOW (ref 8.9–10.3)
Chloride: 101 mmol/L (ref 98–111)
Creatinine, Ser: 0.86 mg/dL (ref 0.44–1.00)
GFR, Estimated: >60 mL/min (ref >60)
Glucose, Bld: 111 mg/dL — ABNORMAL HIGH (ref 70–99)
Potassium: 3.5 mmol/L (ref 3.5–5.1)
Sodium: 136 mmol/L (ref 135–145)

## 2023-03-25 MED ORDER — FUROSEMIDE 20 MG PO TABS: 40.0000 mg | ORAL_TABLET | Freq: Every day | ORAL | 2 refills | Status: DC

## 2023-03-25 MED ORDER — LISINOPRIL 20 MG PO TABS: 20.0000 mg | ORAL_TABLET | Freq: Every day | ORAL | 0 refills | Status: DC

## 2023-03-25 NOTE — TOC Transition Note (Addendum)
 Transition of Care [facility]) - Discharge Note   Patient Details  Name: Frances Kent MRN: [id number] Date of Birth: Apr 15, 1930  Transition of Care [facility]) CM/SW Contact:  Heron KATHEE Edison, RN Phone Number: 03/25/2023, 1:33 PM   Clinical Narrative:  03/25/23: Discharge to home/self care. Declined HH post discharge and any DME. To schedule follow up appointment with PCP in 1 week. Per unit RN patient has transportation home on discharge.    Bing Edison MSN RN CM  RN Case Manager [place]  Transitions of Care Direct Dial: [phone] (Weekends Only) [facility] Main Office Phone: [phone] [facility] Fax: [phone] [email or web address]     Transition of Care Asessment: (Carried over from prior CM note). Insurance and Status: Insurance coverage has been reviewed Patient has primary care physician: Yes Home environment has been reviewed: Single family home Prior level of function:: Modified independent Prior/Current Home Services: No current home services Social Drivers of Health Review: SDOH reviewed interventions complete Readmission risk has been reviewed: Yes Transition of care needs: no transition of care needs at this time         Patient Goals and CMS Choice            Discharge Placement                       Discharge Plan and Services Additional resources added to the After Visit Summary for                                       Social Drivers of Health (SDOH) Interventions SDOH Screenings   Food Insecurity: No Food Insecurity (03/24/2023)  Housing: Low Risk  (03/24/2023)  Transportation Needs: No Transportation Needs (03/24/2023)  Utilities: Not At Risk (03/24/2023)  Depression (PHQ2-9): Low Risk  (03/22/2023)  Recent Concern: Depression (PHQ2-9) - Medium Risk (02/23/2023)  Financial Resource Strain: Low Risk  (02/21/2023)  Physical Activity: Unknown (02/21/2023)  Social Connections: Socially Isolated (03/24/2023)  Stress: Stress Concern Present (02/21/2023)  Tobacco  Use: Low Risk  (03/24/2023)     Readmission Risk Interventions    02/16/2021    3:33 PM 01/14/2021   10:39 AM 11/01/2020   10:42 AM  Readmission Risk Prevention Plan  Transportation Screening Complete Complete Complete  PCP or Specialist Appt within 5-7 Days   Complete  Medication Review (RN CM)   Complete  Medication Review Oceanographer) Complete Complete   PCP or Specialist appointment within 3-5 days of discharge  Complete   HRI or Home Care Consult Complete Complete   SW Recovery Care/Counseling Consult Complete Complete   Palliative Care Screening Not Applicable Not Applicable   Skilled Nursing Facility  Not Applicable

## 2023-03-25 NOTE — Hospital Course (Signed)
 Frances Kent is a 89 year old female with heart failure with reduced EF, hypertension, macular degeneration, who presented complaining of cough, fatigue, shortness of breath, as well as lower extremity edema.  She recently had her Lasix  increased from once daily to twice daily and is having some improvement.  In the ED she was found to be septic with tachypnea, fever to 101, and tachycardia.  Chest x-ray revealed pulmonary congestion and small bilateral pleural effusions.  She also tested positive for COVID-19.  She was admitted for further management.  Given her comorbidities and advanced age, she was started on Paxlovid .  She did initially get started on prednisone  but given she has no oxygen  requirement and was not wheezing this was discontinued.  Procalcitonin was unremarkable and there is low suspicion for superimposed bacterial infection.  Patient did have profound lower extremity edema concerning for acute heart failure exacerbation.  Prior echocardiogram was reviewed which reveals EF of 40 to 45% with severely dilated left atrium, moderately dilated right atrium.  She was started on IV diuresis and swelling improved significantly.  Her stay was further complicated by bradycardia.  On chart review it appears that patient has history of paroxysmal A-fib and is status post pacemaker and her rate is consistently in the 50s on outpatient visits.  She is taking metoprolol  25 mg outpatient.  Here she was initiated on low-dose Coreg  but was unable to tolerate it due to persistent pulse in the 50s or below.  We have recommended that she hold this medication moving forward and follow-up closely with her cardiologist to discuss if she needs to continue taking this.  I have discussed all of this extensively with the patient as well as with her daughters who are at bedside for discussions.  Patient will be discharging directly home today as she is now at her mental and physiologic baseline. She did receive Paxlovid   while she was admitted but was changed to Lovenox  given interaction with Xarelto .  At discharge we will discontinue Paxlovid  as the risk of being off Xarelto  is too high, and these 2 meds cannot be taken concurrently.

## 2023-03-25 NOTE — Discharge Summary (Signed)
 Physician Discharge Summary   Patient: Frances Kent MRN: [id number] DOB: 1931/01/21  Admit date:     03/23/2023  Discharge date: 03/25/23  Discharge Physician: Lorane Poland   PCP: Marylynn Verneita CROME, MD   Recommendations at discharge:    Follow up with cardiology and PCP  Discharge Diagnoses: Principal Problem:   Encephalopathy due to COVID-19 virus Active Problems:   CHF (congestive heart failure) (HCC)  Resolved Problems:   * No resolved hospital problems. [facility] Course: Frances Kent is a 89 year old female with heart failure with reduced EF, hypertension, macular degeneration, who presented complaining of cough, fatigue, shortness of breath, as well as lower extremity edema.  She recently had her Lasix  increased from once daily to twice daily and is having some improvement.  In the ED she was found to be septic with tachypnea, fever to 101, and tachycardia.  Chest x-ray revealed pulmonary congestion and small bilateral pleural effusions.  She also tested positive for COVID-19.  She was admitted for further management.  Given her comorbidities and advanced age, she was started on Paxlovid .  She did initially get started on prednisone  but given she has no oxygen  requirement and was not wheezing this was discontinued.  Procalcitonin was unremarkable and there is low suspicion for superimposed bacterial infection.  Patient did have profound lower extremity edema concerning for acute heart failure exacerbation.  Prior echocardiogram was reviewed which reveals EF of 40 to 45% with severely dilated left atrium, moderately dilated right atrium.  She was started on IV diuresis and swelling improved significantly.  Her stay was further complicated by bradycardia.  On chart review it appears that patient has history of paroxysmal A-fib and is status post pacemaker and her rate is consistently in the 50s on outpatient visits.  She is taking metoprolol  25 mg outpatient.  Here she was initiated on  low-dose Coreg  but was unable to tolerate it due to persistent pulse in the 50s or below.  We have recommended that she hold this medication moving forward and follow-up closely with her cardiologist to discuss if she needs to continue taking this.  I have discussed all of this extensively with the patient as well as with her daughters who are at bedside for discussions.  Patient will be discharging directly home today as she is now at her mental and physiologic baseline. She did receive Paxlovid  while she was admitted but was changed to Lovenox  given interaction with Xarelto .  At discharge we will discontinue Paxlovid  as the risk of being off Xarelto  is too high, and these 2 meds cannot be taken concurrently.   Sepsis - Criteria met on arrival with tachycardia, tachypnea, fever.  Source: COVID-19 - Volume overloaded, avoid additional IV fluids - Procalcitonin unremarkable, symptoms explained by viral illness.  No antibiotics indicated at this time   COVID-19 - Patient is at high risk for respiratory failure with additional comorbidities including her CHF exacerbation.   - Status post IV Solu-Medrol , currently is on room air, no wheezing.  Discontinue further prednisone . - Continue I-S and FV - Procalcitonin unremarkable, low suspicion for concurrent bacterial infection.  No antibiotics indicated   Acute heart failure exacerbation - Echocardiogram 11/2023: LVEF 40 to 45% diastolic parameters indeterminate.  Severely dilated left atrium, moderately dilated right atrium.  Mild mitral valve stenosis. - 40mg  IV Lasix , blood pressures have been soft.  She is unlikely to tolerate more significant diuresis. - Takes Lasix  as needed outpatient.  Has recently had some worsening of  her swelling has been taking it on a more daily basis. - Strict I's and O's  Metabolic encephalopathy, resolved - Secondary to COVID-19 and sepsis.     Paroxysmal atrial fibrillation Status post pacemaker - Currently on  Coreg .  Rate is in the 50s which she reports is her baseline.  She does have a pacemaker. - Continue outpatient follow-up with cardiology, sees [facility] - Patient is on Paxlovid  so we will hold home dose Xarelto  and continue with Lovenox  subcu while admitted.  Xarelto  at discharge   Hypertension - Continue metoprolol  - As needed hydralazine    Hypothyroidism - Continue home dose Synthroid    Generalized deconditioning Advanced age - PT/OT evals  CKD 3b -Stable.       Consultants: n/a Procedures performed: n/a  Disposition: Home Diet recommendation:  Discharge Diet Orders (From admission, onward)     Start     Ordered   03/25/23 0000  Diet general        03/25/23 1304           Regular diet DISCHARGE MEDICATION: Allergies as of 03/25/2023       Reactions   Statins Hives   Severe myalgias        Medication List     PAUSE taking these medications    metoprolol  succinate 25 MG 24 hr tablet Wait to take this until your doctor or other care provider tells you to start again. Commonly known as: TOPROL -XL Take 1 tablet (25 mg total) by mouth 2 (two) times daily.       TAKE these medications    acetaminophen  500 MG tablet Commonly known as: TYLENOL  Take 500 mg by mouth every 6 (six) hours as needed.   furosemide  20 MG tablet Commonly known as: LASIX  Take 2 tablets (40 mg total) by mouth daily. Take daily for 5 days, then as needed What changed: additional instructions   levalbuterol  0.63 MG/3ML nebulizer solution Commonly known as: XOPENEX  USE 1  VIAL IN NEBULIZER EVERY 4 HOURS AS NEEDED FOR WHEEZING AND FOR SHORTNESS OF BREATH   levothyroxine  50 MCG tablet Commonly known as: SYNTHROID  Take 1 tablet (50 mcg total) by mouth daily before breakfast.   lisinopril  20 MG tablet Commonly known as: ZESTRIL  Take 1 tablet (20 mg total) by mouth daily. Start taking on: March 26, 2023   pantoprazole  20 MG tablet Commonly known as: PROTONIX  Take 20 mg by mouth  daily.   Probiotic Chew Chew 2 tablets by mouth daily.   traZODone  50 MG tablet Commonly known as: DESYREL  Take 0.5-1 tablets (25-50 mg total) by mouth at bedtime as needed for sleep.   Xarelto  15 MG Tabs tablet Generic drug: Rivaroxaban  TAKE 1 TABLET EVERY DAY        Discharge Exam: Filed Weights   03/22/23 1717 03/24/23 1753  Weight: 53 kg 51.4 kg   Constitutional:  Normal appearance. Non toxic-appearing.  HENT: Head Normocephalic and atraumatic.  Mucous membranes are moist.  Eyes:  Extraocular intact. Conjunctivae normal. Pupils are equal, round, and reactive to light.  Cardiovascular: Rate and Rhythm: Normal rate and regular rhythm.  Pulmonary: Non labored, symmetric rise of chest wall.  Musculoskeletal:  Normal range of motion.  Skin: warm and dry. not jaundiced.  Neurological: No focal deficit present. alert. Oriented. Psychiatric: Mood and Affect congruent.    Condition at discharge: stable  The results of significant diagnostics from this hospitalization (including imaging, microbiology, ancillary and laboratory) are listed below for reference.   Imaging Studies: DG  Chest 1 View Result Date: 03/23/2023 CLINICAL DATA:  89 year old female with altered mental status, CHF. EXAM: CHEST  1 VIEW COMPARISON:  Portable chest yesterday and earlier. FINDINGS: Portable AP semi upright view at 0900 hours. Mildly improved lung volumes. Stable cardiomegaly and mediastinal contours. Left chest loop recorder or superficial ICD redemonstrated. Small pleural effusions are less apparent, lung base ventilation mildly improved. Stable pulmonary vascularity which has regressed since last month. No pneumothorax. No areas of worsening ventilation. Stable visualized osseous structures. Negative visible bowel gas. IMPRESSION: Cardiomegaly with mildly improved lung volumes and lung base ventilation. Suspected small pleural effusions less apparent. Stable vascular congestion, improved from last  month. Electronically Signed   By: VEAR Hurst M.D.   On: 03/23/2023 09:20   CT HEAD WO CONTRAST ( ) Result Date: 03/23/2023 CLINICAL DATA:  Altered mental status EXAM: CT HEAD WITHOUT CONTRAST TECHNIQUE: Contiguous axial images were obtained from the base of the skull through the vertex without intravenous contrast. RADIATION DOSE REDUCTION: This exam was performed according to the departmental dose-optimization program which includes automated exposure control, adjustment of the mA and/or kV according to patient size and/or use of iterative reconstruction technique. COMPARISON:  None Available. FINDINGS: Brain: There is no mass, hemorrhage or extra-axial collection. There is generalized atrophy without lobar predilection. Hypodensity of the white matter is most commonly associated with chronic microvascular disease. Vascular: No hyperdense vessel or unexpected vascular calcification. Skull: The visualized skull base, calvarium and extracranial soft tissues are normal. Sinuses/Orbits: No fluid levels or advanced mucosal thickening of the visualized paranasal sinuses. No mastoid or middle ear effusion. Normal orbits. Other: None. IMPRESSION: 1. No acute intracranial abnormality. 2. Generalized atrophy and findings of chronic microvascular disease. Electronically Signed   By: Franky Stanford M.D.   On: 03/23/2023 03:12   DG Chest Port 1 View Result Date: 03/22/2023 CLINICAL DATA:  Cough EXAM: PORTABLE CHEST 1 VIEW COMPARISON:  03/14/2023 FINDINGS: Moderate cardiomegaly with multifocal interstitial opacity, unchanged. No pleural effusion or pneumothorax. No focal airspace consolidation. IMPRESSION: Moderate cardiomegaly with multifocal interstitial opacity, unchanged and likely chronic. Electronically Signed   By: Franky Stanford M.D.   On: 03/22/2023 22:36   US  Venous Img Lower Unilateral Left (DVT) Result Date: 03/15/2023 CLINICAL DATA:  Progressive left lower extremity edema EXAM: LEFT LOWER EXTREMITY VENOUS  DOPPLER ULTRASOUND TECHNIQUE: Gray-scale sonography with graded compression, as well as color Doppler and duplex ultrasound were performed to evaluate the lower extremity deep venous systems from the level of the common femoral vein and including the common femoral, femoral, profunda femoral, popliteal and calf veins including the posterior tibial, peroneal and gastrocnemius veins when visible. The superficial great saphenous vein was also interrogated. Spectral Doppler was utilized to evaluate flow at rest and with distal augmentation maneuvers in the common femoral, femoral and popliteal veins. COMPARISON:  None Available. FINDINGS: Contralateral Common Femoral Vein: Respiratory phasicity is normal and symmetric with the symptomatic side. No evidence of thrombus. Normal compressibility. Common Femoral Vein: No evidence of acute thrombus. Mild eccentric wall thickening with echogenicity likely representing calcified residua of prior DVT. Saphenofemoral Junction: No evidence of thrombus. Normal compressibility and flow on color Doppler imaging. Profunda Femoral Vein: No evidence of thrombus. Normal compressibility and flow on color Doppler imaging. Femoral Vein: No evidence of thrombus. Normal compressibility, respiratory phasicity and response to augmentation. Popliteal Vein: No evidence of thrombus. Normal compressibility, respiratory phasicity and response to augmentation. Calf Veins: No evidence of thrombus. Normal compressibility and flow on color Doppler imaging.  Superficial Great Saphenous Vein: No evidence of thrombus. Normal compressibility. Venous Reflux:  None. Other Findings:  None. IMPRESSION: 1. No evidence of acute DVT in the left lower extremity. 2. Mild sequelae of recanalized chronic DVT in the left common femoral vein noted incidentally. Electronically Signed   By: Wilkie Lent M.D.   On: 03/15/2023 16:13   DG Chest 2 View Result Date: 03/14/2023 CLINICAL DATA:  Cough and shortness of  breath EXAM: CHEST - 2 VIEW COMPARISON:  X-ray 11/26/2022. and older FINDINGS: Hyperinflation with enlarged cardiopericardial silhouette calcified aorta. Diffuse interstitial changes again identified. Tiny pleural effusions. No pneumothorax or consolidation. Loop recorder overlies the lower left thorax. Degenerative changes of spine. There is moderate wedge deformity of a lower thoracic level which is unchanged from previous. Surgical clips right upper quadrant of the abdomen. IMPRESSION: Enlarged heart with vascular congestion and diffuse interstitial changes, similar to previous. Acute versus chronic. Tiny pleural effusions. Stable wedge deformity of the lower thoracic spine vertebral level with osteopenia Electronically Signed   By: Ranell Bring M.D.   On: 03/14/2023 16:58    Microbiology: Results for orders placed or performed during the hospital encounter of 03/23/23  Resp panel by RT-PCR (RSV, Flu A&B, Covid) Anterior Nasal Swab     Status: Abnormal   Collection Time: 03/22/23  5:22 PM   Specimen: Anterior Nasal Swab  Result Value Ref Range Status   SARS Coronavirus 2 by RT PCR POSITIVE (A) NEGATIVE Final    Comment: (NOTE) SARS-CoV-2 target nucleic acids are DETECTED.  The SARS-CoV-2 RNA is generally detectable in upper respiratory specimens during the acute phase of infection. Positive results are indicative of the presence of the identified virus, but do not rule out bacterial infection or co-infection with other pathogens not detected by the test. Clinical correlation with patient history and other diagnostic information is necessary to determine patient infection status. The expected result is Negative.  Fact Sheet for Patients: bloggercourse.com  Fact Sheet for Healthcare Providers: seriousbroker.it  This test is not yet approved or cleared by the [place]  FDA and  has been authorized for detection and/or diagnosis of  SARS-CoV-2 by FDA under an Emergency Use Authorization (EUA).  This EUA will remain in effect (meaning this test can be used) for the duration of  the COVID-19 declaration under Section 564(b)(1) of the A ct, 21 U.S.C. section 360bbb-3(b)(1), unless the authorization is terminated or revoked sooner.     Influenza A by PCR NEGATIVE NEGATIVE Final   Influenza B by PCR NEGATIVE NEGATIVE Final    Comment: (NOTE) The Xpert Xpress SARS-CoV-2/FLU/RSV plus assay is intended as an aid in the diagnosis of influenza from Nasopharyngeal swab specimens and should not be used as a sole basis for treatment. Nasal washings and aspirates are unacceptable for Xpert Xpress SARS-CoV-2/FLU/RSV testing.  Fact Sheet for Patients: bloggercourse.com  Fact Sheet for Healthcare Providers: seriousbroker.it  This test is not yet approved or cleared by the [place]  FDA and has been authorized for detection and/or diagnosis of SARS-CoV-2 by FDA under an Emergency Use Authorization (EUA). This EUA will remain in effect (meaning this test can be used) for the duration of the COVID-19 declaration under Section 564(b)(1) of the Act, 21 U.S.C. section 360bbb-3(b)(1), unless the authorization is terminated or revoked.     Resp Syncytial Virus by PCR NEGATIVE NEGATIVE Final    Comment: (NOTE) Fact Sheet for Patients: bloggercourse.com  Fact Sheet for Healthcare Providers: seriousbroker.it  This test is not yet  approved or cleared by the [place]  FDA and has been authorized for detection and/or diagnosis of SARS-CoV-2 by FDA under an Emergency Use Authorization (EUA). This EUA will remain in effect (meaning this test can be used) for the duration of the COVID-19 declaration under Section 564(b)(1) of the Act, 21 U.S.C. section 360bbb-3(b)(1), unless the authorization is terminated  or revoked.  Performed at [facility], [address] [place], [place] [address]     Labs: CBC: Recent Labs  Lab 03/22/23 1722 03/23/23 0630 03/25/23 0501  WBC 6.3 5.7 7.1  HGB 12.5 11.2* 11.2*  HCT 39.4 35.0* 33.7*  MCV 90.2 90.9 86.9  PLT 269 201 227   Basic Metabolic Panel: Recent Labs  Lab 03/22/23 1722 03/23/23 0630 03/24/23 1509 03/25/23 0501  NA 141 137 136 136  K 3.9 3.7 3.6 3.5  CL 104 105 100 101  CO2 26 23 28 27   GLUCOSE 120* 114* 138* 111*  BUN 11 9 16 17   CREATININE 1.04* 0.88 0.96 0.86  CALCIUM  9.3 9.0 8.4* 8.3*   Liver Function Tests: Recent Labs  Lab 03/22/23 1722  AST 24  ALT 19  ALKPHOS 71  BILITOT 1.3*  PROT 7.6  ALBUMIN  4.3   CBG: No results for input(s): GLUCAP in the last 168 hours.  Discharge time spent: greater than 30 minutes.  Signed: Princessa Lesmeister, DO [facility] 03/25/2023

## 2023-03-27 ENCOUNTER — Telehealth: Payer: Self-pay

## 2023-03-27 NOTE — Telephone Encounter (Signed)
 Copied from CRM [id number]. Topic: Clinical - Medical Advice >> Mar 27, 2023  1:42 PM Chuck Crater wrote: Reason for CRM: Patient daughter Venice Gillis wants to know if patient still has to come in for labs since she was just discharged from the hospital with COVID.

## 2023-03-27 NOTE — Telephone Encounter (Signed)
 Does pt still need to have the future blood work done?

## 2023-03-27 NOTE — Transitions of Care (Post Inpatient/ED Visit) (Signed)
   03/27/2023  Name: Frances Kent MRN: [id number] DOB: 10/31/1930  Today's TOC FU Call Status:    Attempted to reach the patient regarding the most recent Inpatient/ED visit.  Follow Up Plan: Additional outreach attempts will be made to reach the patient to complete the Transitions of Care (Post Inpatient/ED visit) call.   The mailbox is full and cannot leave a message  Gareld June, BSN, RN [place]  VBCI - [facility] RN Care Manager [phone]

## 2023-03-28 NOTE — Patient Outreach (Signed)
  Care Coordination   03/28/2023 Name: ALAIA LORDI MRN: [id number] DOB: September 05, 1930   Care Coordination Outreach Attempts:  Successful contact made with daughter/ designated party release Windell Hummingbird.  Per chart review patient hospitalized from 03/23/23 to 03/25/23.  Patient had TOC follow up call on yesterday 03/27/23.  Spoke with daughter Windell Hummingbird and offered to reschedule appointment for next week.  Daughter requested appointment be reschedule for week after next due to patient having several appointments next week.   Follow Up Plan:  Additional outreach attempts will be made to offer the patient complex care management information and services.   Encounter Outcome:  Patient Visit Completed Patients appointment scheduled for 04/11/23.    Care Coordination Interventions:  No, not indicated    Nash Dimmer, BSN, CCM [place]  [facility], Population Health Case Manager Phone: [phone]

## 2023-03-28 NOTE — Telephone Encounter (Signed)
Pt's daughter is aware and gave a verbal understanding.

## 2023-03-30 ENCOUNTER — Ambulatory Visit: Payer: Medicare HMO | Admitting: Pulmonary Disease

## 2023-03-31 ENCOUNTER — Other Ambulatory Visit: Payer: Medicare HMO

## 2023-03-31 DIAGNOSIS — R600 Localized edema: Secondary | ICD-10-CM

## 2023-03-31 DIAGNOSIS — E034 Atrophy of thyroid (acquired): Secondary | ICD-10-CM

## 2023-03-31 LAB — BASIC METABOLIC PANEL
BUN: 15 mg/dL (ref 6–23)
CO2: 32 meq/L (ref 19–32)
Calcium: 8.8 mg/dL (ref 8.4–10.5)
Chloride: 96 meq/L (ref 96–112)
Creatinine, Ser: 1.17 mg/dL (ref 0.40–1.20)
GFR: 41.06 mL/min — ABNORMAL LOW (ref >60.00)
Glucose, Bld: 73 mg/dL (ref 70–99)
Potassium: 3.2 meq/L — ABNORMAL LOW (ref 3.5–5.1)
Sodium: 139 meq/L (ref 135–145)

## 2023-03-31 LAB — TSH: TSH: 4.64 u[IU]/mL (ref 0.35–5.50)

## 2023-03-31 LAB — BRAIN NATRIURETIC PEPTIDE: Pro B Natriuretic peptide (BNP): 1380 pg/mL — ABNORMAL HIGH (ref 0.0–100.0)

## 2023-04-01 ENCOUNTER — Other Ambulatory Visit: Payer: Self-pay | Admitting: Internal Medicine

## 2023-04-01 ENCOUNTER — Encounter: Payer: Self-pay | Admitting: Internal Medicine

## 2023-04-01 MED ORDER — TORSEMIDE 20 MG PO TABS: 20.0000 mg | ORAL_TABLET | Freq: Every day | ORAL | 0 refills | Status: DC

## 2023-04-01 MED ORDER — POTASSIUM CHLORIDE CRYS ER 20 MEQ PO TBCR: 20.0000 meq | EXTENDED_RELEASE_TABLET | Freq: Every day | ORAL | 0 refills | Status: DC

## 2023-04-01 NOTE — Addendum Note (Signed)
Addended by: Sherlene Shams on: 04/01/2023 03:03 PM   Modules accepted: Orders

## 2023-04-01 NOTE — Assessment & Plan Note (Signed)
Secondary to CHF.  Changing oral furosemide to torsemdie due to elevated BNP and worsening GFR on 40 mg lasix  Lab Results  Component Value Date   CREATININE 1.17 03/31/2023   Lab Results  Component Value Date   NA 139 03/31/2023   K 3.2 (L) 03/31/2023   CL 96 03/31/2023   CO2 32 03/31/2023

## 2023-04-01 NOTE — Addendum Note (Signed)
Addended by: Sherlene Shams on: 04/01/2023 03:05 PM   Modules accepted: Orders

## 2023-04-02 ENCOUNTER — Emergency Department: Payer: Medicare HMO

## 2023-04-02 ENCOUNTER — Other Ambulatory Visit: Payer: Self-pay

## 2023-04-02 DIAGNOSIS — R0600 Dyspnea, unspecified: Secondary | ICD-10-CM | POA: Diagnosis not present

## 2023-04-02 DIAGNOSIS — J90 Pleural effusion, not elsewhere classified: Secondary | ICD-10-CM | POA: Diagnosis not present

## 2023-04-02 DIAGNOSIS — I2699 Other pulmonary embolism without acute cor pulmonale: Secondary | ICD-10-CM | POA: Insufficient documentation

## 2023-04-02 DIAGNOSIS — J810 Acute pulmonary edema: Secondary | ICD-10-CM | POA: Diagnosis not present

## 2023-04-02 DIAGNOSIS — I5023 Acute on chronic systolic (congestive) heart failure: Secondary | ICD-10-CM | POA: Diagnosis not present

## 2023-04-02 DIAGNOSIS — Z79899 Other long term (current) drug therapy: Secondary | ICD-10-CM | POA: Diagnosis not present

## 2023-04-02 DIAGNOSIS — Z8673 Personal history of transient ischemic attack (TIA), and cerebral infarction without residual deficits: Secondary | ICD-10-CM | POA: Insufficient documentation

## 2023-04-02 DIAGNOSIS — J849 Interstitial pulmonary disease, unspecified: Secondary | ICD-10-CM | POA: Insufficient documentation

## 2023-04-02 DIAGNOSIS — E039 Hypothyroidism, unspecified: Secondary | ICD-10-CM | POA: Diagnosis not present

## 2023-04-02 DIAGNOSIS — N1832 Chronic kidney disease, stage 3b: Secondary | ICD-10-CM | POA: Diagnosis not present

## 2023-04-02 DIAGNOSIS — R0602 Shortness of breath: Secondary | ICD-10-CM | POA: Diagnosis present

## 2023-04-02 DIAGNOSIS — J811 Chronic pulmonary edema: Secondary | ICD-10-CM | POA: Diagnosis not present

## 2023-04-02 DIAGNOSIS — I4891 Unspecified atrial fibrillation: Secondary | ICD-10-CM | POA: Diagnosis not present

## 2023-04-02 DIAGNOSIS — U071 COVID-19: Secondary | ICD-10-CM | POA: Diagnosis not present

## 2023-04-02 DIAGNOSIS — I130 Hypertensive heart and chronic kidney disease with heart failure and stage 1 through stage 4 chronic kidney disease, or unspecified chronic kidney disease: Secondary | ICD-10-CM | POA: Diagnosis not present

## 2023-04-02 LAB — CBC
HCT: 41.1 % (ref 36.0–46.0)
Hemoglobin: 13.1 g/dL (ref 12.0–15.0)
MCH: 28.7 pg (ref 26.0–34.0)
MCHC: 31.9 g/dL (ref 30.0–36.0)
MCV: 90.1 fL (ref 80.0–100.0)
Platelets: 262 10*3/uL (ref 150–400)
RBC: 4.56 MIL/uL (ref 3.87–5.11)
RDW: 16 % — ABNORMAL HIGH (ref 11.5–15.5)
WBC: 7.1 10*3/uL (ref 4.0–10.5)
nRBC: 0 % (ref 0.0–0.2)

## 2023-04-02 LAB — BASIC METABOLIC PANEL
Anion gap: 13 (ref 5–15)
BUN: 17 mg/dL (ref 8–23)
CO2: 27 mmol/L (ref 22–32)
Calcium: 9.2 mg/dL (ref 8.9–10.3)
Chloride: 99 mmol/L (ref 98–111)
Creatinine, Ser: 1.13 mg/dL — ABNORMAL HIGH (ref 0.44–1.00)
GFR, Estimated: 46 mL/min — ABNORMAL LOW (ref >60)
Glucose, Bld: 106 mg/dL — ABNORMAL HIGH (ref 70–99)
Potassium: 4.3 mmol/L (ref 3.5–5.1)
Sodium: 139 mmol/L (ref 135–145)

## 2023-04-02 LAB — TROPONIN I (HIGH SENSITIVITY)
Troponin I (High Sensitivity): 36 ng/L — ABNORMAL HIGH (ref <18)
Troponin I (High Sensitivity): 35 ng/L — ABNORMAL HIGH (ref <18)

## 2023-04-02 LAB — BRAIN NATRIURETIC PEPTIDE: B Natriuretic Peptide: 1707.7 pg/mL — ABNORMAL HIGH (ref 0.0–100.0)

## 2023-04-02 NOTE — ED Provider Triage Note (Signed)
Emergency Medicine Provider Triage Evaluation Note  Frances Kent , a 89 y.o. female  was evaluated in triage.  Pt complains of worsening shortness of breath since yesterday. Hx of CHF.   Review of Systems  Positive:  Negative:   Physical Exam  Ht 5\' 3"  (1.6 m)   Wt 51.4 kg   BMI 20.07 kg/m  Gen:   Awake, no distress   Resp:  increased effort LCTAB.  MSK:   Moves extremities without difficulty  Other:  Irregular rate on auscultation   Medical Decision Making  Medically screening exam initiated at 6:49 PM.  Appropriate orders placed.  Frances Kent was informed that the remainder of the evaluation will be completed by another provider, this initial triage assessment does not replace that evaluation, and the importance of remaining in the ED until their evaluation is complete.    Romeo Apple, Orva Gwaltney A, PA-C 04/02/23 [phone]

## 2023-04-02 NOTE — ED Triage Notes (Signed)
Pt sts that she has been in A fib all day. Pt sts that she has been having trouble breathing with it also.

## 2023-04-03 ENCOUNTER — Observation Stay: Payer: Medicare HMO

## 2023-04-03 ENCOUNTER — Observation Stay
Admission: EM | Admit: 2023-04-03 | Discharge: 2023-04-04 | Disposition: A | Discharge status: Home / Self Care | Payer: Medicare HMO | Attending: Internal Medicine | Admitting: Internal Medicine

## 2023-04-03 DIAGNOSIS — J849 Interstitial pulmonary disease, unspecified: Secondary | ICD-10-CM | POA: Diagnosis present

## 2023-04-03 DIAGNOSIS — J810 Acute pulmonary edema: Secondary | ICD-10-CM

## 2023-04-03 DIAGNOSIS — I5022 Chronic systolic (congestive) heart failure: Secondary | ICD-10-CM | POA: Diagnosis not present

## 2023-04-03 DIAGNOSIS — I5023 Acute on chronic systolic (congestive) heart failure: Secondary | ICD-10-CM | POA: Diagnosis present

## 2023-04-03 DIAGNOSIS — I509 Heart failure, unspecified: Secondary | ICD-10-CM | POA: Diagnosis not present

## 2023-04-03 DIAGNOSIS — I482 Chronic atrial fibrillation, unspecified: Secondary | ICD-10-CM | POA: Diagnosis present

## 2023-04-03 DIAGNOSIS — I10 Essential (primary) hypertension: Secondary | ICD-10-CM | POA: Diagnosis not present

## 2023-04-03 DIAGNOSIS — I4891 Unspecified atrial fibrillation: Secondary | ICD-10-CM | POA: Diagnosis not present

## 2023-04-03 DIAGNOSIS — R0600 Dyspnea, unspecified: Secondary | ICD-10-CM | POA: Diagnosis not present

## 2023-04-03 DIAGNOSIS — I3139 Other pericardial effusion (noninflammatory): Secondary | ICD-10-CM | POA: Diagnosis not present

## 2023-04-03 DIAGNOSIS — R0602 Shortness of breath: Principal | ICD-10-CM

## 2023-04-03 DIAGNOSIS — N1832 Chronic kidney disease, stage 3b: Secondary | ICD-10-CM | POA: Diagnosis present

## 2023-04-03 DIAGNOSIS — U071 COVID-19: Secondary | ICD-10-CM | POA: Diagnosis present

## 2023-04-03 LAB — RESP PANEL BY RT-PCR (RSV, FLU A&B, COVID)  RVPGX2
Influenza A by PCR: NEGATIVE
Influenza B by PCR: NEGATIVE
Resp Syncytial Virus by PCR: NEGATIVE
SARS Coronavirus 2 by RT PCR: POSITIVE — AB

## 2023-04-03 LAB — MAGNESIUM: Magnesium: 2.5 mg/dL — ABNORMAL HIGH (ref 1.7–2.4)

## 2023-04-03 LAB — PROTIME-INR
INR: 1.3 — ABNORMAL HIGH (ref 0.8–1.2)
Prothrombin Time: 16.7 s — ABNORMAL HIGH (ref 11.4–15.2)

## 2023-04-03 LAB — TSH: TSH: 6.657 u[IU]/mL — ABNORMAL HIGH (ref 0.350–4.500)

## 2023-04-03 LAB — T4, FREE: Free T4: 1.8 ng/dL — ABNORMAL HIGH (ref 0.61–1.12)

## 2023-04-03 LAB — D-DIMER, QUANTITATIVE: D-Dimer, Quant: 0.41 ug{FEU}/mL (ref 0.00–0.50)

## 2023-04-03 MED ORDER — FUROSEMIDE 10 MG/ML IJ SOLN
40.0000 mg | Freq: Two times a day (BID) | INTRAMUSCULAR | Status: DC
  Administered 2023-04-03 – 2023-04-04 (×2): 40 mg via INTRAVENOUS
  Filled 2023-04-03 (×2): qty 4

## 2023-04-03 MED ORDER — FUROSEMIDE 10 MG/ML IJ SOLN
40.0000 mg | Freq: Once | INTRAMUSCULAR | Status: DC
Start: 2023-04-03 — End: 2023-04-03
  Filled 2023-04-03: qty 4

## 2023-04-03 MED ORDER — TORSEMIDE 20 MG PO TABS
20.0000 mg | ORAL_TABLET | Freq: Once | ORAL | Status: AC
  Administered 2023-04-03: 20 mg via ORAL
  Filled 2023-04-03: qty 1

## 2023-04-03 MED ORDER — ONDANSETRON HCL 4 MG/2ML IJ SOLN
4.0000 mg | Freq: Four times a day (QID) | INTRAMUSCULAR | Status: DC | PRN
Start: 2023-04-03 — End: 2023-04-04

## 2023-04-03 MED ORDER — METOPROLOL SUCCINATE ER 50 MG PO TB24
50.0000 mg | ORAL_TABLET | Freq: Two times a day (BID) | ORAL | Status: DC
  Administered 2023-04-03 – 2023-04-04 (×3): 50 mg via ORAL
  Filled 2023-04-03 (×3): qty 1

## 2023-04-03 MED ORDER — POTASSIUM CHLORIDE CRYS ER 20 MEQ PO TBCR
20.0000 meq | EXTENDED_RELEASE_TABLET | Freq: Every day | ORAL | Status: DC
  Administered 2023-04-03 – 2023-04-04 (×2): 20 meq via ORAL
  Filled 2023-04-03 (×2): qty 1

## 2023-04-03 MED ORDER — METOPROLOL TARTRATE 25 MG PO TABS
25.0000 mg | ORAL_TABLET | Freq: Once | ORAL | Status: AC
  Administered 2023-04-03: 25 mg via ORAL
  Filled 2023-04-03: qty 1

## 2023-04-03 MED ORDER — RIVAROXABAN 15 MG PO TABS
15.0000 mg | ORAL_TABLET | Freq: Every day | ORAL | Status: DC
  Administered 2023-04-03: 15 mg via ORAL
  Filled 2023-04-03: qty 1

## 2023-04-03 MED ORDER — ONDANSETRON HCL 4 MG PO TABS
4.0000 mg | ORAL_TABLET | Freq: Four times a day (QID) | ORAL | Status: DC | PRN
Start: 2023-04-03 — End: 2023-04-04

## 2023-04-03 MED ORDER — LISINOPRIL 10 MG PO TABS
20.0000 mg | ORAL_TABLET | Freq: Every day | ORAL | Status: DC
  Administered 2023-04-03 – 2023-04-04 (×2): 20 mg via ORAL
  Filled 2023-04-03 (×2): qty 2

## 2023-04-03 MED ORDER — IPRATROPIUM-ALBUTEROL 0.5-2.5 (3) MG/3ML IN SOLN: 3.0000 mL | RESPIRATORY_TRACT | Status: DC | PRN

## 2023-04-03 NOTE — ED Provider Notes (Signed)
[facility] Provider Note    Event Date/Time   First MD Initiated Contact with Patient 04/03/23 [phone]     (approximate)   History   Shortness of Breath   HPI  Frances Kent is a 89 y.o. female with history of atrial fibrillation, hypertension, CHF, hypothyroidism, PE, CVA who presents to the emergency department complaints of shortness of breath.  States she always has shortness of breath and cough but that has been worse today.  She was just admitted to the hospital in 03/23/2023 for generalized weakness, encephalopathy secondary to COVID.  Family reports that "her heart rate has been all over the place".  She states that the highest that was at home was in the 120s.  She reports she took her metoprolol this morning but has not had it tonight.  She denies any new chest pain.  No fevers.  No lower extremity swelling or discomfort.  Family reports she was taken off of her metoprolol and Eliquis while in the hospital while she was on Paxlovid.  She has since resumed those medications.  States that her doctor just transitioned her from Lasix to torsemide 20 mg daily but she has not yet started the prescription.   History provided by patient, daughter.    Past Medical History:  Diagnosis Date   Arthritis    Atrial fibrillation (HCC)    Benign breast cyst in female, left 10/07/2016   Candida esophagitis (HCC) 05/03/2021   Found on Mar 10 2021 EGD    CAP (community acquired pneumonia) 02/15/2021   CHF (congestive heart failure) (HCC)    Colon adenomas    GERD (gastroesophageal reflux disease)    HCAP (healthcare-associated pneumonia) 02/02/2021   Hypertension    Hypothyroidism    Macular degeneration    Mitral regurgitation    Pulmonary embolism (HCC) 02/2016   Severe sepsis (HCC) 02/02/2021   Stroke (HCC) 10/2020   SVT (supraventricular tachycardia) (HCC)     Past Surgical History:  Procedure Laterality Date   APPENDECTOMY  1960   BREAST CYST  ASPIRATION Left 2017   CARDIOVERSION N/A 04/05/2016   Procedure: Cardioversion;  Surgeon: Lamar Blinks, MD;  Location: [facility];  Service: Cardiovascular;  Laterality: N/A;   CHOLECYSTECTOMY  1985   ESOPHAGOGASTRODUODENOSCOPY N/A 03/10/2021   Procedure: ESOPHAGOGASTRODUODENOSCOPY (EGD);  Surgeon: Toledo, Boykin Nearing, MD;  Location: [facility];  Service: Gastroenterology;  Laterality: N/A;   ESOPHAGOGASTRODUODENOSCOPY (EGD) WITH PROPOFOL N/A 11/27/2019   Procedure: ESOPHAGOGASTRODUODENOSCOPY (EGD) WITH PROPOFOL;  Surgeon: Regis Bill, MD;  Location: [facility];  Service: Endoscopy;  Laterality: N/A;   ESOPHAGOGASTRODUODENOSCOPY (EGD) WITH PROPOFOL N/A 12/24/2019   Procedure: ESOPHAGOGASTRODUODENOSCOPY (EGD) WITH PROPOFOL;  Surgeon: Regis Bill, MD;  Location: [facility];  Service: Endoscopy;  Laterality: N/A;   IR CT HEAD LTD  11/01/2020   IR PERCUTANEOUS ART THROMBECTOMY/INFUSION INTRACRANIAL INC DIAG ANGIO  11/01/2020   IR US GUIDE VASC ACCESS RIGHT  11/02/2020   OVARIAN CYST REMOVAL     PACEMAKER LEADLESS INSERTION N/A 11/16/2021   Procedure: PACEMAKER LEADLESS INSERTION;  Surgeon: Marcina Millard, MD;  Location: [facility];  Service: Cardiovascular;  Laterality: N/A;   RADIOLOGY WITH ANESTHESIA N/A 11/01/2020   Procedure: RADIOLOGY WITH ANESTHESIA;  Surgeon: Julieanne Cotton, MD;  Location: [facility];  Service: Radiology;  Laterality: N/A;   TEE WITHOUT CARDIOVERSION N/A 02/01/2018   Procedure: TRANSESOPHAGEAL ECHOCARDIOGRAM (TEE);  Surgeon: Lamar Blinks, MD;  Location: [facility];  Service: Cardiovascular;  Laterality:  N/A;    MEDICATIONS:  Prior to Admission medications   Medication Sig Start Date End Date Taking? Authorizing Provider  acetaminophen (TYLENOL) 500 MG tablet Take 500 mg by mouth every 6 (six) hours as needed.    [provider]  levalbuterol (XOPENEX) 0.63 MG/3ML nebulizer solution USE 1  VIAL IN NEBULIZER EVERY 4  HOURS AS NEEDED FOR WHEEZING AND FOR SHORTNESS OF BREATH 12/12/22   Salena Saner, MD  levothyroxine (SYNTHROID) 50 MCG tablet Take 1 tablet (50 mcg total) by mouth daily before breakfast. 03/22/23   Sherlene Shams, MD  lisinopril (ZESTRIL) 20 MG tablet Take 1 tablet (20 mg total) by mouth daily. 03/26/23   Dezii, Alexandra, DO  metoprolol succinate (TOPROL-XL) 25 MG 24 hr tablet Take 1 tablet (25 mg total) by mouth 2 (two) times daily. 11/29/22   Jonah Blue, MD  pantoprazole (PROTONIX) 20 MG tablet Take 20 mg by mouth daily.    [provider]  potassium chloride SA (KLOR-CON M) 20 MEQ tablet Take 1 tablet (20 mEq total) by mouth daily. 04/01/23   Sherlene Shams, MD  Probiotic CHEW Chew 2 tablets by mouth daily.    [provider]  torsemide (DEMADEX) 20 MG tablet Take 1 tablet (20 mg total) by mouth daily. 04/01/23   Sherlene Shams, MD  traZODone (DESYREL) 50 MG tablet Take 0.5-1 tablets (25-50 mg total) by mouth at bedtime as needed for sleep. 01/06/23   Sherlene Shams, MD  XARELTO 15 MG TABS tablet TAKE 1 TABLET EVERY DAY 03/10/23   Sherlene Shams, MD    Physical Exam   Triage Vital Signs: ED Triage Vitals  Encounter Vitals Group     BP 04/02/23 1852 (!) 144/93     Systolic BP Percentile --      Diastolic BP Percentile --      Pulse Rate 04/02/23 1852 (!) 115     Resp 04/02/23 1852 19     Temp 04/02/23 1852 98 F (36.7 C)     Temp Source 04/02/23 1852 Oral     SpO2 04/02/23 1852 97 %     Weight 04/02/23 1839 113 lb 5.1 oz (51.4 kg)     Height 04/02/23 1839 5\' 3"  (1.6 m)     Head Circumference --      Peak Flow --      Pain Score 04/02/23 1839 0     Pain Loc --      Pain Education --      Exclude from Growth Chart --     Most recent vital signs: Vitals:   04/03/23 0530 04/03/23 0630  BP: (!) 112/52 (!) 135/97  Pulse: (!) 110   Resp: 17 (!) 26  Temp:    SpO2: 96% 94%    CONSTITUTIONAL: Alert, responds appropriately to questions.  Elderly, in  no distress HEAD: Normocephalic, atraumatic EYES: Conjunctivae clear, pupils appear equal, sclera nonicteric ENT: normal nose; moist mucous membranes NECK: Supple, normal ROM CARD: Irregularly irregular and slightly tachycardic; S1 and S2 appreciated RESP: Normal chest excursion without splinting or tachypnea; breath sounds clear and equal bilaterally; no wheezes, no rhonchi, no rales, no hypoxia or respiratory distress, speaking full sentences ABD/GI: Non-distended; soft, non-tender, no rebound, no guarding, no peritoneal signs BACK: The back appears normal EXT: Normal ROM in all joints; no deformity noted, no edema, no calf tenderness or calf swelling SKIN: Normal color for age and race; warm; no rash on exposed skin NEURO: Moves  all extremities equally, normal speech PSYCH: The patient's mood and manner are appropriate.   ED Results / Procedures / Treatments   LABS: (all labs ordered are listed, but only abnormal results are displayed) Labs Reviewed  RESP PANEL BY RT-PCR (RSV, FLU A&B, COVID)  RVPGX2 - Abnormal; Notable for the following components:      Result Value   SARS Coronavirus 2 by RT PCR POSITIVE (*)    All other components within normal limits  BASIC METABOLIC PANEL - Abnormal; Notable for the following components:   Glucose, Bld 106 (*)    Creatinine, Ser 1.13 (*)    GFR, Estimated 46 (*)    All other components within normal limits  CBC - Abnormal; Notable for the following components:   RDW 16.0 (*)    All other components within normal limits  BRAIN NATRIURETIC PEPTIDE - Abnormal; Notable for the following components:   B Natriuretic Peptide 1,707.7 (*)    All other components within normal limits  PROTIME-INR - Abnormal; Notable for the following components:   Prothrombin Time 16.7 (*)    INR 1.3 (*)    All other components within normal limits  TSH - Abnormal; Notable for the following components:   TSH 6.657 (*)    All other components within normal  limits  MAGNESIUM - Abnormal; Notable for the following components:   Magnesium 2.5 (*)    All other components within normal limits  T4, FREE - Abnormal; Notable for the following components:   Free T4 1.80 (*)    All other components within normal limits  TROPONIN I (HIGH SENSITIVITY) - Abnormal; Notable for the following components:   Troponin I (High Sensitivity) 36 (*)    All other components within normal limits  TROPONIN I (HIGH SENSITIVITY) - Abnormal; Notable for the following components:   Troponin I (High Sensitivity) 35 (*)    All other components within normal limits  D-DIMER, QUANTITATIVE     EKG:  EKG Interpretation Date/Time:  Sunday April 02 2023 18:42:53 EST Ventricular Rate:  115 PR Interval:    QRS Duration:  120 QT Interval:  386 QTC Calculation: 533 R Axis:   -86  Text Interpretation: Atrial fibrillation with rapid ventricular response Right bundle branch block Left anterior fascicular block Bifascicular block T wave abnormality, consider lateral ischemia Abnormal ECG When compared with ECG of 23-Mar-2023 02:51, PREVIOUS ECG IS PRESENT Confirmed by Rochele Raring [phone]) on 04/03/2023 1:36:00 AM          EKG Interpretation Date/Time:  Monday April 03 2023 02:50:21 EST Ventricular Rate:  108 PR Interval:    QRS Duration:  122 QT Interval:  416 QTC Calculation: 557 R Axis:   -86  Text Interpretation: Atrial fibrillation with rapid ventricular response Right bundle branch block Left anterior fascicular block Bifascicular block T wave abnormality, consider lateral ischemia Abnormal ECG When compared with ECG of 02-Apr-2023 18:42, No significant change was found Confirmed by Rochele Raring [phone]) on 04/03/2023 3:03:10 AM         RADIOLOGY: My personal review and interpretation of imaging: CT chest shows minimal pulmonary edema.  I have personally reviewed all radiology reports.   CT CHEST WO CONTRAST Result Date: 04/03/2023 CLINICAL DATA:   Chronic dyspnea with unclear etiology EXAM: CT CHEST WITHOUT CONTRAST TECHNIQUE: Multidetector CT imaging of the chest was performed following the standard protocol without IV contrast. RADIATION DOSE REDUCTION: This exam was performed according to the departmental dose-optimization program which includes automated exposure  control, adjustment of the mA and/or kV according to patient size and/or use of iterative reconstruction technique. COMPARISON:  Radiograph from yesterday FINDINGS: Cardiovascular: Cardiac enlargement. Small low-density pericardial effusion measuring up to 9 mm in thickness posteriorly. There is atheromatous calcification. Prominent mitral annular calcification. Lead less pacer at the right ventricular apex. Mediastinum/Nodes: No worrisome lymph node enlargement, mass, or esophageal thickening. Lungs/Pleura: Biapical pleural based scarring. Central airways are clear. Minor dependent atelectasis. There is no consolidation, effusion, or pneumothorax. Slight interlobular septal thickening best seen on coronal reformats. Upper Abdomen: Cholecystectomy clips and atheromatous calcification. Musculoskeletal: T11 compression fracture with moderate height loss, nonacute and known from 2023 MRI. IMPRESSION: 1. Slight thickening of the interstitium may be minimal pulmonary edema. There is chronic cardiomegaly with small pericardial effusion. 2. Negative for pneumonia. Electronically Signed   By: Tiburcio Pea M.D.   On: 04/03/2023 07:28   DG Chest 2 View Result Date: 04/02/2023 CLINICAL DATA:  Dyspnea, atrial fibrillation EXAM: CHEST - 2 VIEW COMPARISON:  03/23/2023 FINDINGS: The lungs are symmetrically well expanded. Small left pleural effusion is present. Chronic interstitial thickening again noted though superimposed interstitial pulmonary edema has resolved. No pneumothorax. Stable cardiomegaly. Leadless pacemaker again noted. No acute bone abnormality. IMPRESSION: 1. Small left pleural effusion.  2. Cardiomegaly. 3. Resolved pulmonary edema Electronically Signed   By: Helyn Numbers M.D.   On: 04/02/2023 19:41     PROCEDURES:  Critical Care performed: No      .1-3 Lead EKG Interpretation  Performed by: Haru Anspaugh, Layla Maw, DO Authorized by: Tarea Skillman, Layla Maw, DO     Interpretation: abnormal     ECG rate:  109   ECG rate assessment: tachycardic     Rhythm: atrial fibrillation     Ectopy: none     Conduction: normal       IMPRESSION / MDM / ASSESSMENT AND PLAN / ED COURSE  I reviewed the triage vital signs and the nursing notes.    Patient here with increased shortness of breath.  History of A-fib with rates as high as the 120s at home.  The patient is on the cardiac monitor to evaluate for evidence of arrhythmia and/or significant heart rate changes.   DIFFERENTIAL DIAGNOSIS (includes but not limited to):   Atrial fibrillation, anemia, electrolyte derangement, thyroid dysfunction, CHF, pneumonia, PE, ACS, pneumothorax, viral URI   Patient's presentation is most consistent with acute presentation with potential threat to life or bodily function.   PLAN: Patient's hemoglobin is normal.  Electrolytes normal.  Troponins minimally elevated but flat.  BNP is 1700.  She does not look volume overloaded clinically on exam and her chest x-ray when reviewed and interpreted by myself and radiologist shows that her previous pulmonary edema has resolved and she only has a small left pleural effusion.  Given this significantly elevated BNP, history of DVT with recent hospitalization, I am concerned about possible PE.  Will obtain D-dimer.  Will give her home metoprolol.  If this is not sufficient in controlling her heart rate, will start on diltiazem.  Will give dose of Lasix here to help with this pleural effusion.  She is not hypoxic at rest.  Will check COVID, flu and RSV swabs.   MEDICATIONS GIVEN IN ED: Medications  metoprolol tartrate (LOPRESSOR) tablet 25 mg (25 mg Oral Given  04/03/23 0256)  torsemide (DEMADEX) tablet 20 mg (20 mg Oral Given 04/03/23 0326)     ED COURSE: Patient is still positive for COVID-19, likely residual from previous  admission.  Flu and RSV negative.  TSH mildly elevated.  Will obtain free T4.  Patient was anxious about getting IV Lasix here given she states her doctor wanted her to be on torsemide.  She agreed to an oral dose of torsemide here and has been diuresing well.  Heart rate has improved with metoprolol into the 90s.  No hypoxia at rest or significant shortness of breath with ambulating to the bathroom.   6:00 AM  On repeat evaluation, patient's heart rate in the 70s at rest.  She states she is still very short of breath with ambulation and feels that this is worse than her normal.  Her D-dimer today is negative.  She still could be volume overloaded although not seen on chest x-ray given her elevated BNP.  Will obtain CT of the chest.  She does not feel comfortable going home.  She states she feels her symptoms worsen when her heart rate jumps up but this seems to be well-controlled currently with her oral metoprolol.  Will discuss with hospitalist for potential observation admission given patient's concern and request to stay.  Patient and daughter comfortable with this plan.   7:30 AM  Pt's CT chest shows minimal pulmonary edema.  Stable pericardial effusion.  No clinical signs of tamponade.  Patient resistant to IV diuresis.  She has been diuresing well after oral torsemide.   CONSULTS:  Consulted and discussed patient's case with hospitalist, Dr. Para March.  I have recommended admission and consulting physician agrees and will place admission orders.  Patient (and family if present) agree with this plan.   I reviewed all nursing notes, vitals, pertinent previous records.  All labs, EKGs, imaging ordered have been independently reviewed and interpreted by myself.    OUTSIDE RECORDS REVIEWED: Reviewed patient's recent  admission.       FINAL CLINICAL IMPRESSION(S) / ED DIAGNOSES   Final diagnoses:  Shortness of breath  Atrial fibrillation with RVR (HCC)  Acute pulmonary edema (HCC)     Rx / DC Orders   ED Discharge Orders     None        Note:  This document was prepared using Dragon voice recognition software and may include unintentional dictation errors.   Kanijah Groseclose, Layla Maw, DO 04/03/23 [phone]

## 2023-04-03 NOTE — Assessment & Plan Note (Addendum)
 Marland Kitchen

## 2023-04-03 NOTE — ED Notes (Signed)
Lab called to come draw labs on hard stick Pt.

## 2023-04-03 NOTE — ED Notes (Signed)
 CCMD called and Pt placed on Cardiac monitoring.

## 2023-04-03 NOTE — H&P (Addendum)
History and Physical    Patient: Frances Kent AOZ:[id number] DOB: 1930/07/08 DOA: 04/03/2023 DOS: the patient was seen and examined on 04/03/2023 PCP: Sherlene Shams, MD  Patient coming from: Home  Chief Complaint:  Chief Complaint  Patient presents with   Shortness of Breath   HPI: Frances Kent is a 89 y.o. female with medical history significant of chronic HFrEF, hypertension, macular degeneration, interstitial lung disease, atrial fibrillation presenting with dyspnea.  Patient noted to have been admitted February 6 through February 8 for issues including COVID-19, sepsis as well as acute on chronic HFrEF.  Was given course of Paxlovid as well as IV diuresis.  Anticoagulation was held in the setting of Paxlovid use.  Per the patient as well as daughter at the bedside, patient with persistent shortness of breath over multiple days.  Was recently transition from 20 mg of Lasix to 20 mg torsemide by outpatient physician.  Per the daughter Elita Quick, pt has not been able to take thus far.  Weight has been maintained around 107kg.  No nausea or vomiting.  No reported cough or wheezing.  Positive orthopnea.  No body aches or chills.  No malaise.  No abdominal pain or diarrhea. Presented to the ER afebrile, hemodynamically stable.  Satting well on room air.  White count 7.1, hemoglobin 13.1, platelets 262, D-dimer within normal limits, COVID-positive today.  Troponin 30s.  Creatinine 1.13.  BNP 1700.  CT chest without contrast showing slight thickening of the interstitium with concern for pulmonary edema versus chronic cardiomegaly with small pleural effusion. Review of Systems: As mentioned in the history of present illness. All other systems reviewed and are negative. Past Medical History:  Diagnosis Date   Arthritis    Atrial fibrillation (HCC)    Benign breast cyst in female, left 10/07/2016   Candida esophagitis (HCC) 05/03/2021   Found on Mar 10 2021 EGD    CAP (community acquired  pneumonia) 02/15/2021   CHF (congestive heart failure) (HCC)    Colon adenomas    GERD (gastroesophageal reflux disease)    HCAP (healthcare-associated pneumonia) 02/02/2021   Hypertension    Hypothyroidism    Macular degeneration    Mitral regurgitation    Pulmonary embolism (HCC) 02/2016   Severe sepsis (HCC) 02/02/2021   Stroke (HCC) 10/2020   SVT (supraventricular tachycardia) (HCC)    Past Surgical History:  Procedure Laterality Date   APPENDECTOMY  1960   BREAST CYST ASPIRATION Left 2017   CARDIOVERSION N/A 04/05/2016   Procedure: Cardioversion;  Surgeon: Lamar Blinks, MD;  Location: [facility];  Service: Cardiovascular;  Laterality: N/A;   CHOLECYSTECTOMY  1985   ESOPHAGOGASTRODUODENOSCOPY N/A 03/10/2021   Procedure: ESOPHAGOGASTRODUODENOSCOPY (EGD);  Surgeon: Toledo, Boykin Nearing, MD;  Location: [facility];  Service: Gastroenterology;  Laterality: N/A;   ESOPHAGOGASTRODUODENOSCOPY (EGD) WITH PROPOFOL N/A 11/27/2019   Procedure: ESOPHAGOGASTRODUODENOSCOPY (EGD) WITH PROPOFOL;  Surgeon: Regis Bill, MD;  Location: [facility];  Service: Endoscopy;  Laterality: N/A;   ESOPHAGOGASTRODUODENOSCOPY (EGD) WITH PROPOFOL N/A 12/24/2019   Procedure: ESOPHAGOGASTRODUODENOSCOPY (EGD) WITH PROPOFOL;  Surgeon: Regis Bill, MD;  Location: [facility];  Service: Endoscopy;  Laterality: N/A;   IR CT HEAD LTD  11/01/2020   IR PERCUTANEOUS ART THROMBECTOMY/INFUSION INTRACRANIAL INC DIAG ANGIO  11/01/2020   IR US GUIDE VASC ACCESS RIGHT  11/02/2020   OVARIAN CYST REMOVAL     PACEMAKER LEADLESS INSERTION N/A 11/16/2021   Procedure: PACEMAKER LEADLESS INSERTION;  Surgeon: Marcina Millard, MD;  Location: [facility];  Service: Cardiovascular;  Laterality: N/A;   RADIOLOGY WITH ANESTHESIA N/A 11/01/2020   Procedure: RADIOLOGY WITH ANESTHESIA;  Surgeon: Julieanne Cotton, MD;  Location: [facility];  Service: Radiology;  Laterality: N/A;   TEE WITHOUT CARDIOVERSION  N/A 02/01/2018   Procedure: TRANSESOPHAGEAL ECHOCARDIOGRAM (TEE);  Surgeon: Lamar Blinks, MD;  Location: [facility];  Service: Cardiovascular;  Laterality: N/A;   Social History:  reports that she has never smoked. She has never used smokeless tobacco. She reports that she does not drink alcohol and does not use drugs.  Allergies  Allergen Reactions   Statins Hives    Severe myalgias    Family History  Problem Relation Age of Onset   Cancer Mother 61       breast cancer, lived to 83,    Breast cancer Mother 32   Cancer Son 87       pancreatic cancer   Heart disease Son        CAD, Tobacco Abuse    Heart disease Maternal Grandmother 8       died of massive MI   Stroke Sister     Prior to Admission medications   Medication Sig Start Date End Date Taking? Authorizing Provider  acetaminophen (TYLENOL) 500 MG tablet Take 500 mg by mouth every 6 (six) hours as needed.    [provider]  levalbuterol (XOPENEX) 0.63 MG/3ML nebulizer solution USE 1  VIAL IN NEBULIZER EVERY 4 HOURS AS NEEDED FOR WHEEZING AND FOR SHORTNESS OF BREATH 12/12/22   Salena Saner, MD  levothyroxine (SYNTHROID) 50 MCG tablet Take 1 tablet (50 mcg total) by mouth daily before breakfast. 03/22/23   Sherlene Shams, MD  lisinopril (ZESTRIL) 20 MG tablet Take 1 tablet (20 mg total) by mouth daily. 03/26/23   Dezii, Alexandra, DO  metoprolol succinate (TOPROL-XL) 25 MG 24 hr tablet Take 1 tablet (25 mg total) by mouth 2 (two) times daily. 11/29/22   Jonah Blue, MD  pantoprazole (PROTONIX) 20 MG tablet Take 20 mg by mouth daily.    [provider]  potassium chloride SA (KLOR-CON M) 20 MEQ tablet Take 1 tablet (20 mEq total) by mouth daily. 04/01/23   Sherlene Shams, MD  Probiotic CHEW Chew 2 tablets by mouth daily.    [provider]  torsemide (DEMADEX) 20 MG tablet Take 1 tablet (20 mg total) by mouth daily. 04/01/23   Sherlene Shams, MD  traZODone (DESYREL) 50 MG tablet Take  0.5-1 tablets (25-50 mg total) by mouth at bedtime as needed for sleep. 01/06/23   Sherlene Shams, MD  XARELTO 15 MG TABS tablet TAKE 1 TABLET EVERY DAY 03/10/23   Sherlene Shams, MD    Physical Exam: Vitals:   04/03/23 0256 04/03/23 0321 04/03/23 0530 04/03/23 0630  BP: (!) 159/108  (!) 112/52 (!) 135/97  Pulse: 94  (!) 110   Resp:   17 (!) 26  Temp:  98 F (36.7 C)    TempSrc:  Oral    SpO2:   96% 94%  Weight:      Height:       Physical Exam Constitutional:      Appearance: She is normal weight.  HENT:     Head: Normocephalic and atraumatic.     Nose: Nose normal.     Mouth/Throat:     Mouth: Mucous membranes are moist.  Eyes:     Pupils: Pupils are equal, round, and reactive to light.  Cardiovascular:     Rate and Rhythm: Normal rate and regular rhythm.  Pulmonary:     Effort: Pulmonary effort is normal.  Abdominal:     General: Bowel sounds are normal.  Musculoskeletal:        General: Normal range of motion.  Skin:    General: Skin is warm.  Neurological:     General: No focal deficit present.  Psychiatric:        Mood and Affect: Mood normal.     Data Reviewed:  There are no new results to review at this time.  CT CHEST WO CONTRAST CLINICAL DATA:  Chronic dyspnea with unclear etiology  EXAM: CT CHEST WITHOUT CONTRAST  TECHNIQUE: Multidetector CT imaging of the chest was performed following the standard protocol without IV contrast.  RADIATION DOSE REDUCTION: This exam was performed according to the departmental dose-optimization program which includes automated exposure control, adjustment of the mA and/or kV according to patient size and/or use of iterative reconstruction technique.  COMPARISON:  Radiograph from yesterday  FINDINGS: Cardiovascular: Cardiac enlargement. Small low-density pericardial effusion measuring up to 9 mm in thickness posteriorly. There is atheromatous calcification. Prominent mitral annular calcification. Lead less  pacer at the right ventricular apex.  Mediastinum/Nodes: No worrisome lymph node enlargement, mass, or esophageal thickening.  Lungs/Pleura: Biapical pleural based scarring. Central airways are clear. Minor dependent atelectasis. There is no consolidation, effusion, or pneumothorax. Slight interlobular septal thickening best seen on coronal reformats.  Upper Abdomen: Cholecystectomy clips and atheromatous calcification.  Musculoskeletal: T11 compression fracture with moderate height loss, nonacute and known from 2023 MRI.  IMPRESSION: 1. Slight thickening of the interstitium may be minimal pulmonary edema. There is chronic cardiomegaly with small pericardial effusion. 2. Negative for pneumonia.  Electronically Signed   By: Tiburcio Pea M.D.   On: 04/03/2023 07:28  Lab Results  Component Value Date   WBC 7.1 04/02/2023   HGB 13.1 04/02/2023   HCT 41.1 04/02/2023   MCV 90.1 04/02/2023   PLT 262 04/02/2023   Last metabolic panel Lab Results  Component Value Date   GLUCOSE 106 (H) 04/02/2023   NA 139 04/02/2023   K 4.3 04/02/2023   CL 99 04/02/2023   CO2 27 04/02/2023   BUN 17 04/02/2023   CREATININE 1.13 (H) 04/02/2023   GFRNONAA 46 (L) 04/02/2023   CALCIUM 9.2 04/02/2023   PHOS 3.9 02/20/2021   PROT 7.6 03/22/2023   ALBUMIN 4.3 03/22/2023   BILITOT 1.3 (H) 03/22/2023   ALKPHOS 71 03/22/2023   AST 24 03/22/2023   ALT 19 03/22/2023   ANIONGAP 13 04/02/2023    Assessment and Plan: Dyspnea Covid 19  Acute on chronic HFrEF (11/2023: LVEF 40 to 45% diastolic parameters indeterminate)  Recurrent dyspnea in setting of baseline HFrEF and recent covid 19 infection  CXR and CT chest w/ trace edema  BNP 1700 today- well above baseline  Noted recent change from lasix to torsemide in the outpatient setting  COVID + today- protracted covid may be a confounding issue  No hypoxia at present  Will give extra dose of torsemide x 1 Formally consult cardiology for acute  on chronic HFrEF    ILD (interstitial lung disease) (HCC) Baseline ILD  Followed by [facility] Minimal wheezing at present  CT chest grossly  Consider short course of po steroids if SOB persists despite diuresis   Chronic kidney disease, stage 3b (HCC) Cr 1.13 w/ GFR in the 40s  Monitor   Atrial fibrillation status post  cardioversion (HCC) Stable  Cont xarelto (restarted in outpt setting after course of paxlovid) Cont BB        Advance Care Planning:   Code Status: Limited: Do not attempt resuscitation (DNR) -DNR-LIMITED -Do Not Intubate/DNI    Consults: Cardiology   Family Communication: Daughter at the bedside   Severity of Illness: The appropriate patient status for this patient is OBSERVATION. Observation status is judged to be reasonable and necessary in order to provide the required intensity of service to ensure the patient's safety. The patient's presenting symptoms, physical exam findings, and initial radiographic and laboratory data in the context of their medical condition is felt to place them at decreased risk for further clinical deterioration. Furthermore, it is anticipated that the patient will be medically stable for discharge from the hospital within 2 midnights of admission.   Author: Floydene Flock, MD 04/03/2023 8:26 AM  For on call review [email or web address].

## 2023-04-03 NOTE — Assessment & Plan Note (Signed)
Baseline ILD  Patient off oxygen

## 2023-04-03 NOTE — Progress Notes (Signed)
 Heart Failure Navigator Progress Note  Assessed for Heart & Vascular TOC clinic readiness.  Patient does not meet criteria due to current [facility] patient.   Navigator will sign off at this time.  Roxy Horseman, RN, BSN [facility] Heart Failure Navigator Secure Chat Only

## 2023-04-03 NOTE — Consult Note (Signed)
[facility] CLINIC CARDIOLOGY CONSULT NOTE       Patient ID: Frances Kent MRN: [id number] DOB/AGE: 1930/05/12 89 y.o.  Admit date: 04/03/2023 Referring Physician Dr. Doree Albee Primary Physician Sherlene Shams, MD  Primary Cardiologist Dr. Darrold Junker Reason for Consultation AoCHF  HPI: Frances Kent is a 89 y.o. female  with a past medical history of paroxysmal atrial fibrillation on xarelto, HFrEF (EF 40-45%), severe MR, hypertension, history of PE, hypothyroidism, GERD, Esophageal stricture w/ Dilation in 2021 who presented to the ED on 04/03/2023 for elevated heart rate. Cardiology was consulted for further evaluation.   Patient states that over the last few days she just has not felt right.  Reports that she was checking her heart rate at home and this seemed elevated for her about 100.  Given that she decided to come to the ED.  Workup in the ED notable for creatinine 1.13, potassium 4.3, hemoglobin 13.1, WBC 7.1.  BNP elevated at 1700.  Troponins mildly elevated and flat trending at 36 > 35.  She is positive for COVID, initially diagnosed about 12 days ago.    Patient seen and examined this morning resting comfortably in ED stretcher with daughter present at bedside.  She reports that prior to coming in she noticed a mild increase in her shortness of breath and fatigue.  Reports of feeling palpitations and could tell that her A-fib was acting up.  Heart rate was running slightly above 100.  She reports that she otherwise felt okay overall and would not have come to the ED if she had not been checking her heart rate.  She states that overall her shortness of breath has improved, episodes of difficulty breathing comes and goes.  Denies any palpitations at this time and her heart rate appears improved.  Review of systems complete and found to be negative unless listed above    Past Medical History:  Diagnosis Date   Arthritis    Atrial fibrillation (HCC)    Benign breast cyst in  female, left 10/07/2016   Candida esophagitis (HCC) 05/03/2021   Found on Mar 10 2021 EGD    CAP (community acquired pneumonia) 02/15/2021   CHF (congestive heart failure) (HCC)    Colon adenomas    GERD (gastroesophageal reflux disease)    HCAP (healthcare-associated pneumonia) 02/02/2021   Hypertension    Hypothyroidism    Macular degeneration    Mitral regurgitation    Pulmonary embolism (HCC) 02/2016   Severe sepsis (HCC) 02/02/2021   Stroke (HCC) 10/2020   SVT (supraventricular tachycardia) (HCC)     Past Surgical History:  Procedure Laterality Date   APPENDECTOMY  1960   BREAST CYST ASPIRATION Left 2017   CARDIOVERSION N/A 04/05/2016   Procedure: Cardioversion;  Surgeon: Lamar Blinks, MD;  Location: [facility] OR[facility];  Service: Cardiovascular;  Laterality: N/A;   CHOLECYSTECTOMY  1985   ESOPHAGOGASTRODUODENOSCOPY N/A 03/10/2021   Procedure: ESOPHAGOGASTRODUODENOSCOPY (EGD);  Surgeon: Toledo, Boykin Nearing, MD;  Location: [facility];  Service: Gastroenterology;  Laterality: N/A;   ESOPHAGOGASTRODUODENOSCOPY (EGD) WITH PROPOFOL N/A 11/27/2019   Procedure: ESOPHAGOGASTRODUODENOSCOPY (EGD) WITH PROPOFOL;  Surgeon: Regis Bill, MD;  Location: [facility];  Service: Endoscopy;  Laterality: N/A;   ESOPHAGOGASTRODUODENOSCOPY (EGD) WITH PROPOFOL N/A 12/24/2019   Procedure: ESOPHAGOGASTRODUODENOSCOPY (EGD) WITH PROPOFOL;  Surgeon: Regis Bill, MD;  Location: [facility];  Service: Endoscopy;  Laterality: N/A;   IR CT HEAD LTD  11/01/2020   IR PERCUTANEOUS ART THROMBECTOMY/INFUSION INTRACRANIAL INC DIAG ANGIO  11/01/2020  IR US GUIDE VASC ACCESS RIGHT  11/02/2020   OVARIAN CYST REMOVAL     PACEMAKER LEADLESS INSERTION N/A 11/16/2021   Procedure: PACEMAKER LEADLESS INSERTION;  Surgeon: Marcina Millard, MD;  Location: [facility];  Service: Cardiovascular;  Laterality: N/A;   RADIOLOGY WITH ANESTHESIA N/A 11/01/2020   Procedure: RADIOLOGY WITH ANESTHESIA;   Surgeon: Julieanne Cotton, MD;  Location: [facility];  Service: Radiology;  Laterality: N/A;   TEE WITHOUT CARDIOVERSION N/A 02/01/2018   Procedure: TRANSESOPHAGEAL ECHOCARDIOGRAM (TEE);  Surgeon: Lamar Blinks, MD;  Location: [facility];  Service: Cardiovascular;  Laterality: N/A;    (Not in a hospital admission)  Social History   Socioeconomic History   Marital status: Widowed    Spouse name: Not on file   Number of children: Not on file   Years of education: Not on file   Highest education level: 10th grade  Occupational History   Not on file  Tobacco Use   Smoking status: Never   Smokeless tobacco: Never   Tobacco comments:    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 , worked at [employer], Animator status: Never Used  Substance and Sexual Activity   Alcohol use: No   Drug use: No   Sexual activity: Not Currently  Other Topics Concern   Not on file  Social History Narrative   Has caretaking responsibility for 3 young grandchildren who live with her   Social Drivers of Corporate investment banker Strain: Low Risk  (02/21/2023)   Overall Financial Resource Strain (CARDIA)    Difficulty of Paying Living Expenses: Not hard at all  Food Insecurity: No Food Insecurity (03/24/2023)   Hunger Vital Sign    Worried About Running Out of Food in the Last Year: Never true    Ran Out of Food in the Last Year: Never true  Transportation Needs: No Transportation Needs (03/24/2023)   PRAPARE - Administrator, Civil Service (Medical): No    Lack of Transportation (Non-Medical): No  Physical Activity: Unknown (02/21/2023)   Exercise Vital Sign    Days of Exercise per Week: 0 days    Minutes of Exercise per Session: Not on file  Stress: Stress Concern Present (02/21/2023)   [employer] of Occupational Health - Occupational Stress Questionnaire    Feeling of Stress : Very much  Social Connections: Socially Isolated (03/24/2023)   Social Connection and Isolation Panel [NHANES]     Frequency of Communication with Friends and Family: More than three times a week    Frequency of Social Gatherings with Friends and Family: Once a week    Attends Religious Services: Never    Database administrator or Organizations: No    Attends Banker Meetings: Never    Marital Status: Widowed  Intimate Partner Violence: Not At Risk (03/24/2023)   Humiliation, Afraid, Rape, and Kick questionnaire    Fear of Current or Ex-Partner: No    Emotionally Abused: No    Physically Abused: No    Sexually Abused: No    Family History  Problem Relation Age of Onset   Cancer Mother 79       breast cancer, lived to 74,    Breast cancer Mother 7   Cancer Son 80       pancreatic cancer   Heart disease Son        CAD, Tobacco Abuse    Heart disease Maternal Grandmother 61  died of massive MI   Stroke Sister      Vitals:   04/03/23 0321 04/03/23 0530 04/03/23 0630 04/03/23 1000  BP:  (!) 112/52 (!) 135/97 (!) 138/112  Pulse:  (!) 110  (!) 53  Resp:  17 (!) 26 20  Temp: 98 F (36.7 C)   98.1 F (36.7 C)  TempSrc: Oral   Oral  SpO2:  96% 94% 97%  Weight:      Height:        PHYSICAL EXAM General: Well appearing elderly female, well nourished, in no acute distress. HEENT: Normocephalic and atraumatic. Neck: No JVD.  Lungs: Normal respiratory effort on room air. Clear bilaterally to auscultation. No wheezes, crackles, rhonchi.  Heart: Irregularly irregular, controlled rate. Normal S1 and S2 without gallops or murmurs.  Abdomen: Non-distended appearing.  Msk: Normal strength and tone for age. Extremities: Warm and well perfused. No clubbing, cyanosis. No edema.  Neuro: Alert and oriented X 3. Psych: Answers questions appropriately.   Labs: Basic Metabolic Panel: Recent Labs    04/02/23 1850  NA 139  K 4.3  CL 99  CO2 27  GLUCOSE 106*  BUN 17  CREATININE 1.13*  CALCIUM 9.2  MG 2.5*   Liver Function Tests: No results for input(s): "AST", "ALT",  "ALKPHOS", "BILITOT", "PROT", "ALBUMIN" in the last 72 hours. No results for input(s): "LIPASE", "AMYLASE" in the last 72 hours. CBC: Recent Labs    04/02/23 1850  WBC 7.1  HGB 13.1  HCT 41.1  MCV 90.1  PLT 262   Cardiac Enzymes: Recent Labs    04/02/23 1850 04/02/23 2324  TROPONINIHS 36* 35*   BNP: Recent Labs    04/02/23 1850  BNP 1,707.7*   D-Dimer: Recent Labs    04/03/23 0506  DDIMER 0.41   Hemoglobin A1C: No results for input(s): "HGBA1C" in the last 72 hours. Fasting Lipid Panel: No results for input(s): "CHOL", "HDL", "LDLCALC", "TRIG", "CHOLHDL", "LDLDIRECT" in the last 72 hours. Thyroid Function Tests: Recent Labs    04/02/23 1850  TSH 6.657*   Anemia Panel: No results for input(s): "VITAMINB12", "FOLATE", "FERRITIN", "TIBC", "IRON", "RETICCTPCT" in the last 72 hours.   Radiology: CT CHEST WO CONTRAST Result Date: 04/03/2023 CLINICAL DATA:  Chronic dyspnea with unclear etiology EXAM: CT CHEST WITHOUT CONTRAST TECHNIQUE: Multidetector CT imaging of the chest was performed following the standard protocol without IV contrast. RADIATION DOSE REDUCTION: This exam was performed according to the departmental dose-optimization program which includes automated exposure control, adjustment of the mA and/or kV according to patient size and/or use of iterative reconstruction technique. COMPARISON:  Radiograph from yesterday FINDINGS: Cardiovascular: Cardiac enlargement. Small low-density pericardial effusion measuring up to 9 mm in thickness posteriorly. There is atheromatous calcification. Prominent mitral annular calcification. Lead less pacer at the right ventricular apex. Mediastinum/Nodes: No worrisome lymph node enlargement, mass, or esophageal thickening. Lungs/Pleura: Biapical pleural based scarring. Central airways are clear. Minor dependent atelectasis. There is no consolidation, effusion, or pneumothorax. Slight interlobular septal thickening best seen on coronal  reformats. Upper Abdomen: Cholecystectomy clips and atheromatous calcification. Musculoskeletal: T11 compression fracture with moderate height loss, nonacute and known from 2023 MRI. IMPRESSION: 1. Slight thickening of the interstitium may be minimal pulmonary edema. There is chronic cardiomegaly with small pericardial effusion. 2. Negative for pneumonia. Electronically Signed   By: Tiburcio Pea M.D.   On: 04/03/2023 07:28   DG Chest 2 View Result Date: 04/02/2023 CLINICAL DATA:  Dyspnea, atrial fibrillation EXAM: CHEST - 2 VIEW  COMPARISON:  03/23/2023 FINDINGS: The lungs are symmetrically well expanded. Small left pleural effusion is present. Chronic interstitial thickening again noted though superimposed interstitial pulmonary edema has resolved. No pneumothorax. Stable cardiomegaly. Leadless pacemaker again noted. No acute bone abnormality. IMPRESSION: 1. Small left pleural effusion. 2. Cardiomegaly. 3. Resolved pulmonary edema Electronically Signed   By: Helyn Numbers M.D.   On: 04/02/2023 19:41   DG Chest 1 View Result Date: 03/23/2023 CLINICAL DATA:  89 year old female with altered mental status, CHF. EXAM: CHEST  1 VIEW COMPARISON:  Portable chest yesterday and earlier. FINDINGS: Portable AP semi upright view at 0900 hours. Mildly improved lung volumes. Stable cardiomegaly and mediastinal contours. Left chest loop recorder or superficial ICD redemonstrated. Small pleural effusions are less apparent, lung base ventilation mildly improved. Stable pulmonary vascularity which has regressed since last month. No pneumothorax. No areas of worsening ventilation. Stable visualized osseous structures. Negative visible bowel gas. IMPRESSION: Cardiomegaly with mildly improved lung volumes and lung base ventilation. Suspected small pleural effusions less apparent. Stable vascular congestion, improved from last month. Electronically Signed   By: Odessa Fleming M.D.   On: 03/23/2023 09:20   CT HEAD WO CONTRAST  ( ) Result Date: 03/23/2023 CLINICAL DATA:  Altered mental status EXAM: CT HEAD WITHOUT CONTRAST TECHNIQUE: Contiguous axial images were obtained from the base of the skull through the vertex without intravenous contrast. RADIATION DOSE REDUCTION: This exam was performed according to the departmental dose-optimization program which includes automated exposure control, adjustment of the mA and/or kV according to patient size and/or use of iterative reconstruction technique. COMPARISON:  None Available. FINDINGS: Brain: There is no mass, hemorrhage or extra-axial collection. There is generalized atrophy without lobar predilection. Hypodensity of the white matter is most commonly associated with chronic microvascular disease. Vascular: No hyperdense vessel or unexpected vascular calcification. Skull: The visualized skull base, calvarium and extracranial soft tissues are normal. Sinuses/Orbits: No fluid levels or advanced mucosal thickening of the visualized paranasal sinuses. No mastoid or middle ear effusion. Normal orbits. Other: None. IMPRESSION: 1. No acute intracranial abnormality. 2. Generalized atrophy and findings of chronic microvascular disease. Electronically Signed   By: Deatra Robinson M.D.   On: 03/23/2023 03:12   DG Chest Port 1 View Result Date: 03/22/2023 CLINICAL DATA:  Cough EXAM: PORTABLE CHEST 1 VIEW COMPARISON:  03/14/2023 FINDINGS: Moderate cardiomegaly with multifocal interstitial opacity, unchanged. No pleural effusion or pneumothorax. No focal airspace consolidation. IMPRESSION: Moderate cardiomegaly with multifocal interstitial opacity, unchanged and likely chronic. Electronically Signed   By: Deatra Robinson M.D.   On: 03/22/2023 22:36   US Venous Img Lower Unilateral Left (DVT) Result Date: 03/15/2023 CLINICAL DATA:  Progressive left lower extremity edema EXAM: LEFT LOWER EXTREMITY VENOUS DOPPLER ULTRASOUND TECHNIQUE: Gray-scale sonography with graded compression, as well as color  Doppler and duplex ultrasound were performed to evaluate the lower extremity deep venous systems from the level of the common femoral vein and including the common femoral, femoral, profunda femoral, popliteal and calf veins including the posterior tibial, peroneal and gastrocnemius veins when visible. The superficial great saphenous vein was also interrogated. Spectral Doppler was utilized to evaluate flow at rest and with distal augmentation maneuvers in the common femoral, femoral and popliteal veins. COMPARISON:  None Available. FINDINGS: Contralateral Common Femoral Vein: Respiratory phasicity is normal and symmetric with the symptomatic side. No evidence of thrombus. Normal compressibility. Common Femoral Vein: No evidence of acute thrombus. Mild eccentric wall thickening with echogenicity likely representing calcified residua of prior DVT. Saphenofemoral Junction:  No evidence of thrombus. Normal compressibility and flow on color Doppler imaging. Profunda Femoral Vein: No evidence of thrombus. Normal compressibility and flow on color Doppler imaging. Femoral Vein: No evidence of thrombus. Normal compressibility, respiratory phasicity and response to augmentation. Popliteal Vein: No evidence of thrombus. Normal compressibility, respiratory phasicity and response to augmentation. Calf Veins: No evidence of thrombus. Normal compressibility and flow on color Doppler imaging. Superficial Great Saphenous Vein: No evidence of thrombus. Normal compressibility. Venous Reflux:  None. Other Findings:  None. IMPRESSION: 1. No evidence of acute DVT in the left lower extremity. 2. Mild sequelae of recanalized chronic DVT in the left common femoral vein noted incidentally. Electronically Signed   By: Malachy Moan M.D.   On: 03/15/2023 16:13   DG Chest 2 View Result Date: 03/14/2023 CLINICAL DATA:  Cough and shortness of breath EXAM: CHEST - 2 VIEW COMPARISON:  X-ray 11/26/2022. and older FINDINGS: Hyperinflation with  enlarged cardiopericardial silhouette calcified aorta. Diffuse interstitial changes again identified. Tiny pleural effusions. No pneumothorax or consolidation. Loop recorder overlies the lower left thorax. Degenerative changes of spine. There is moderate wedge deformity of a lower thoracic level which is unchanged from previous. Surgical clips right upper quadrant of the abdomen. IMPRESSION: Enlarged heart with vascular congestion and diffuse interstitial changes, similar to previous. Acute versus chronic. Tiny pleural effusions. Stable wedge deformity of the lower thoracic spine vertebral level with osteopenia Electronically Signed   By: Karen Kays M.D.   On: 03/14/2023 16:58    ECHO 01/2023: MILD LEFT VENTRICULAR SYSTOLIC DYSFUNCTION WITH MILD LVH  ESTIMATED EF: 45%  DIASTOLIC FUNCTION CAN'T BE DETERMINED  NORMAL RIGHT VENTRICULAR SYSTOLIC FUNCTION  VALVULAR REGURGITATION: MILD AR, MODERATE MR, MILD PR, MILD TR  VALVULAR STENOSIS: No AS, MODERATE MS, No PS, No TS   TELEMETRY reviewed by me 04/03/2023: atrial fibrillation rate 90s  EKG reviewed by me: atrial fibrillation RBBB rate 108 bpm  Data reviewed by me 04/03/2023: last 24h vitals tele labs imaging I/O ED provider note, admission H&P  Principal Problem:   CHF (congestive heart failure) (HCC) Active Problems:   Dyspnea   Atrial fibrillation status post cardioversion (HCC)   Chronic kidney disease, stage 3b (HCC)   ILD (interstitial lung disease) (HCC)   CHF exacerbation (HCC)    ASSESSMENT AND PLAN:  Frances Kent is a 89 y.o. female  with a past medical history of chronic atrial fibrillation on xarelto, HFrEF (EF 40-45%), severe MR, hypertension, history of PE, hypothyroidism, GERD, Esophageal stricture w/ Dilation in 2021 who presented to the ED on 04/03/2023 for elevated heart rate. Cardiology was consulted for further evaluation.   # Atrial fibrillation RVR # Chronic atrial fibrillation Patient reports elevated heart rates  brought her to the ED.  She was found to be in A-fib RVR.  Given p.o. metoprolol with improvement in heart rate. -Increase metoprolol to succinate to 50 mg twice daily. -Continue Xarelto 15 mg daily for stroke risk reduction.  # Acute on chronic HFrEF # Hypertension Patient reports that she has had mildly worsening shortness of breath.  Few days.  BNP elevated at 1700.  Started on p.o. diuretics in the ED. -Will start IV Lasix 40 mg twice daily and evaluate response. -Continue lisinopril 20 mg daily, see plan for metoprolol above.  Further additions to GDMT limited by BP and renal function. -Strict I's and O's, daily weights. -Monitor renal function closely.   This patient's plan of care was discussed and created with Dr. Corky Sing and  he is in agreement.  Signed: Gale Journey, PA-C  04/03/2023, 3:09 PM [facility]

## 2023-04-03 NOTE — Assessment & Plan Note (Signed)
Stable  Cont xarelto (restarted in outpt setting after course of paxlovid) Cont BB

## 2023-04-03 NOTE — Assessment & Plan Note (Signed)
Creatinine 1.23 with IV diuresis.  Check BMP and follow-up appointment.

## 2023-04-04 ENCOUNTER — Encounter: Payer: Self-pay | Admitting: Family Medicine

## 2023-04-04 DIAGNOSIS — I5022 Chronic systolic (congestive) heart failure: Secondary | ICD-10-CM | POA: Diagnosis not present

## 2023-04-04 DIAGNOSIS — I4891 Unspecified atrial fibrillation: Secondary | ICD-10-CM | POA: Diagnosis not present

## 2023-04-04 DIAGNOSIS — I482 Chronic atrial fibrillation, unspecified: Secondary | ICD-10-CM | POA: Diagnosis not present

## 2023-04-04 DIAGNOSIS — N1832 Chronic kidney disease, stage 3b: Secondary | ICD-10-CM

## 2023-04-04 DIAGNOSIS — I10 Essential (primary) hypertension: Secondary | ICD-10-CM | POA: Diagnosis not present

## 2023-04-04 DIAGNOSIS — I5023 Acute on chronic systolic (congestive) heart failure: Secondary | ICD-10-CM | POA: Diagnosis not present

## 2023-04-04 DIAGNOSIS — U071 COVID-19: Secondary | ICD-10-CM | POA: Diagnosis not present

## 2023-04-04 DIAGNOSIS — J849 Interstitial pulmonary disease, unspecified: Secondary | ICD-10-CM | POA: Diagnosis not present

## 2023-04-04 LAB — CBC
HCT: 44.1 % (ref 36.0–46.0)
Hemoglobin: 14 g/dL (ref 12.0–15.0)
MCH: 28.4 pg (ref 26.0–34.0)
MCHC: 31.7 g/dL (ref 30.0–36.0)
MCV: 89.5 fL (ref 80.0–100.0)
Platelets: 294 10*3/uL (ref 150–400)
RBC: 4.93 MIL/uL (ref 3.87–5.11)
RDW: 16 % — ABNORMAL HIGH (ref 11.5–15.5)
WBC: 10.8 10*3/uL — ABNORMAL HIGH (ref 4.0–10.5)
nRBC: 0 % (ref 0.0–0.2)

## 2023-04-04 LAB — BASIC METABOLIC PANEL
Anion gap: 14 (ref 5–15)
BUN: 20 mg/dL (ref 8–23)
CO2: 26 mmol/L (ref 22–32)
Calcium: 8.9 mg/dL (ref 8.9–10.3)
Chloride: 98 mmol/L (ref 98–111)
Creatinine, Ser: 1.23 mg/dL — ABNORMAL HIGH (ref 0.44–1.00)
GFR, Estimated: 42 mL/min — ABNORMAL LOW (ref >60)
Glucose, Bld: 124 mg/dL — ABNORMAL HIGH (ref 70–99)
Potassium: 3.7 mmol/L (ref 3.5–5.1)
Sodium: 138 mmol/L (ref 135–145)

## 2023-04-04 MED ORDER — METOPROLOL SUCCINATE ER 50 MG PO TB24: 25.0000 mg | ORAL_TABLET | Freq: Once | ORAL | Status: DC

## 2023-04-04 MED ORDER — TORSEMIDE 20 MG PO TABS: 20.0000 mg | ORAL_TABLET | Freq: Every day | ORAL | Status: DC

## 2023-04-04 MED ORDER — METOPROLOL SUCCINATE ER 50 MG PO TB24: 75.0000 mg | ORAL_TABLET | Freq: Two times a day (BID) | ORAL | Status: DC

## 2023-04-04 MED ORDER — TRAZODONE HCL 50 MG PO TABS: 25.0000 mg | ORAL_TABLET | Freq: Every evening | ORAL | Status: DC | PRN

## 2023-04-04 MED ORDER — METOPROLOL SUCCINATE ER 25 MG PO TB24: 75.0000 mg | ORAL_TABLET | Freq: Two times a day (BID) | ORAL | 0 refills | Status: DC

## 2023-04-04 MED ORDER — PANTOPRAZOLE SODIUM 20 MG PO TBEC
20.0000 mg | DELAYED_RELEASE_TABLET | Freq: Every day | ORAL | Status: DC
  Administered 2023-04-04: 20 mg via ORAL
  Filled 2023-04-04: qty 1

## 2023-04-04 MED ORDER — LEVOTHYROXINE SODIUM 50 MCG PO TABS
50.0000 ug | ORAL_TABLET | Freq: Every day | ORAL | Status: DC
  Administered 2023-04-04: 50 ug via ORAL
  Filled 2023-04-04: qty 1

## 2023-04-04 NOTE — Care Management Obs Status (Signed)
MEDICARE OBSERVATION STATUS NOTIFICATION   Patient Details  Name: Frances Kent MRN: [id number] Date of Birth: 1930-03-22   Medicare Observation Status Notification Given:  Yes    Mohogany Toppins Aris Lot, LCSW 04/04/2023, 11:13 AM

## 2023-04-04 NOTE — Care Management CC44 (Signed)
Condition Code 44 Documentation Completed  Patient Details  Name: KAYA POTTENGER MRN: [id number] Date of Birth: Sep 11, 1930   Condition Code 44 given:  Yes Patient signature on Condition Code 44 notice:  Yes Documentation of 2 MD's agreement:  Yes Code 44 added to claim:  Yes    Erin Sons, LCSW 04/04/2023, 11:13 AM

## 2023-04-04 NOTE — ED Notes (Signed)
Lab called and advised BNP was hemolyzed. RN requested them to come collect as our samples were repeatedly hemolyzed.

## 2023-04-04 NOTE — Assessment & Plan Note (Addendum)
Cardiology increase Toprol to 75 mg twice daily.  Continue Xarelto.

## 2023-04-04 NOTE — Assessment & Plan Note (Addendum)
Patient was diuresed with IV Lasix.  Patient feeling better and wanting to go home.  Last ejection fraction 40 to 45% on previous echocardiogram with moderate mitral regurgitation.  Patient is on lisinopril and Toprol dose increased.  Can go back on p.o. torsemide as outpatient.

## 2023-04-04 NOTE — Progress Notes (Addendum)
[facility] CLINIC CARDIOLOGY PROGRESS NOTE       Patient ID: Frances Kent MRN: [id number] DOB/AGE: [birth year] 88 y.o.  Admit date: 04/03/2023 Referring Physician Dr. Doree Albee Primary Physician Sherlene Shams, MD  Primary Cardiologist Dr. Darrold Junker Reason for Consultation AoCHF  HPI: Frances Kent is a 89 y.o. female  with a past medical history of paroxysmal atrial fibrillation on xarelto, HFrEF (EF 40-45%), severe MR, hypertension, history of PE, hypothyroidism, GERD, Esophageal stricture w/ Dilation in 2021 who presented to the ED on 04/03/2023 for elevated heart rate. Cardiology was consulted for further evaluation.   Interval history: -Patient seen and examined this AM, reports feeling ok.  -States SOB is stable. Reports she has no appetite. Denies any chest pain or palpitations.  -HR remains around 90-100s despite increase in metoprolol dose. BP stable.  Review of systems complete and found to be negative unless listed above    Past Medical History:  Diagnosis Date   Arthritis    Atrial fibrillation (HCC)    Benign breast cyst in female, left 10/07/2016   Candida esophagitis (HCC) 05/03/2021   Found on Mar 10 2021 EGD    CAP (community acquired pneumonia) 02/15/2021   CHF (congestive heart failure) (HCC)    Colon adenomas    GERD (gastroesophageal reflux disease)    HCAP (healthcare-associated pneumonia) 02/02/2021   Hypertension    Hypothyroidism    Macular degeneration    Mitral regurgitation    Pulmonary embolism (HCC) 02/2016   Severe sepsis (HCC) 02/02/2021   Stroke (HCC) 10/2020   SVT (supraventricular tachycardia) (HCC)     Past Surgical History:  Procedure Laterality Date   APPENDECTOMY  1960   BREAST CYST ASPIRATION Left 2017   CARDIOVERSION N/A 04/05/2016   Procedure: Cardioversion;  Surgeon: Lamar Blinks, MD;  Location: [facility] ORS;  Service: Cardiovascular;  Laterality: N/A;   CHOLECYSTECTOMY  1985   ESOPHAGOGASTRODUODENOSCOPY N/A  03/10/2021   Procedure: ESOPHAGOGASTRODUODENOSCOPY (EGD);  Surgeon: Toledo, Boykin Nearing, MD;  Location: [facility] ENDOSCOPY;  Service: Gastroenterology;  Laterality: N/A;   ESOPHAGOGASTRODUODENOSCOPY (EGD) WITH PROPOFOL N/A 11/27/2019   Procedure: ESOPHAGOGASTRODUODENOSCOPY (EGD) WITH PROPOFOL;  Surgeon: Regis Bill, MD;  Location: [facility] ENDOSCOPY;  Service: Endoscopy;  Laterality: N/A;   ESOPHAGOGASTRODUODENOSCOPY (EGD) WITH PROPOFOL N/A 12/24/2019   Procedure: ESOPHAGOGASTRODUODENOSCOPY (EGD) WITH PROPOFOL;  Surgeon: Regis Bill, MD;  Location: [facility] ENDOSCOPY;  Service: Endoscopy;  Laterality: N/A;   IR CT HEAD LTD  11/01/2020   IR PERCUTANEOUS ART THROMBECTOMY/INFUSION INTRACRANIAL INC DIAG ANGIO  11/01/2020   IR US GUIDE VASC ACCESS RIGHT  11/02/2020   OVARIAN CYST REMOVAL     PACEMAKER LEADLESS INSERTION N/A 11/16/2021   Procedure: PACEMAKER LEADLESS INSERTION;  Surgeon: Marcina Millard, MD;  Location: [facility] INVASIVE CV LAB;  Service: Cardiovascular;  Laterality: N/A;   RADIOLOGY WITH ANESTHESIA N/A 11/01/2020   Procedure: RADIOLOGY WITH ANESTHESIA;  Surgeon: Julieanne Cotton, MD;  Location: [facility] OR;  Service: Radiology;  Laterality: N/A;   TEE WITHOUT CARDIOVERSION N/A 02/01/2018   Procedure: TRANSESOPHAGEAL ECHOCARDIOGRAM (TEE);  Surgeon: Lamar Blinks, MD;  Location: [facility] ORS;  Service: Cardiovascular;  Laterality: N/A;    (Not in a hospital admission)  Social History   Socioeconomic History   Marital status: Widowed    Spouse name: Not on file   Number of children: Not on file   Years of education: Not on file   Highest education level: 10th grade  Occupational History   Not on file  Tobacco  Use   Smoking status: Never   Smokeless tobacco: Never   Tobacco comments:    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 , worked at [employer], Animator status: Never Used  Substance and Sexual Activity   Alcohol use: No   Drug use: No   Sexual activity: Not Currently   Other Topics Concern   Not on file  Social History Narrative   Has caretaking responsibility for 3 young grandchildren who live with her   Social Drivers of Corporate investment banker Strain: Low Risk  (02/21/2023)   Overall Financial Resource Strain (CARDIA)    Difficulty of Paying Living Expenses: Not hard at all  Food Insecurity: No Food Insecurity (03/24/2023)   Hunger Vital Sign    Worried About Running Out of Food in the Last Year: Never true    Ran Out of Food in the Last Year: Never true  Transportation Needs: No Transportation Needs (03/24/2023)   PRAPARE - Administrator, Civil Service (Medical): No    Lack of Transportation (Non-Medical): No  Physical Activity: Unknown (02/21/2023)   Exercise Vital Sign    Days of Exercise per Week: 0 days    Minutes of Exercise per Session: Not on file  Stress: Stress Concern Present (02/21/2023)   [employer] of Occupational Health - Occupational Stress Questionnaire    Feeling of Stress : Very much  Social Connections: Socially Isolated (03/24/2023)   Social Connection and Isolation Panel [NHANES]    Frequency of Communication with Friends and Family: More than three times a week    Frequency of Social Gatherings with Friends and Family: Once a week    Attends Religious Services: Never    Database administrator or Organizations: No    Attends Banker Meetings: Never    Marital Status: Widowed  Intimate Partner Violence: Not At Risk (03/24/2023)   Humiliation, Afraid, Rape, and Kick questionnaire    Fear of Current or Ex-Partner: No    Emotionally Abused: No    Physically Abused: No    Sexually Abused: No    Family History  Problem Relation Age of Onset   Cancer Mother 14       breast cancer, lived to 43,    Breast cancer Mother 50   Cancer Son 27       pancreatic cancer   Heart disease Son        CAD, Tobacco Abuse    Heart disease Maternal Grandmother 42       died of massive MI   Stroke Sister       Vitals:   04/04/23 0400 04/04/23 0600 04/04/23 0615 04/04/23 0800  BP: 125/86 115/86  116/87  Pulse: (!) 110 (!) 113 94 (!) 105  Resp: 18  18 20   Temp:    97.8 F (36.6 C)  TempSrc:    Oral  SpO2: 97% 96% 95% 96%  Weight:      Height:        PHYSICAL EXAM General: Well appearing elderly female, well nourished, in no acute distress. HEENT: Normocephalic and atraumatic. Neck: No JVD.  Lungs: Normal respiratory effort on room air. Clear bilaterally to auscultation. No wheezes, crackles, rhonchi.  Heart: Irregularly irregular, controlled rate. Normal S1 and S2 without gallops or murmurs.  Abdomen: Non-distended appearing.  Msk: Normal strength and tone for age. Extremities: Warm and well perfused. No clubbing, cyanosis. No edema.  Neuro: Alert and oriented X 3. Psych: Answers  questions appropriately.   Labs: Basic Metabolic Panel: Recent Labs    04/02/23 1850  NA 139  K 4.3  CL 99  CO2 27  GLUCOSE 106*  BUN 17  CREATININE 1.13*  CALCIUM 9.2  MG 2.5*   Liver Function Tests: No results for input(s): "AST", "ALT", "ALKPHOS", "BILITOT", "PROT", "ALBUMIN" in the last 72 hours. No results for input(s): "LIPASE", "AMYLASE" in the last 72 hours. CBC: Recent Labs    04/02/23 1850 04/04/23 0230  WBC 7.1 10.8*  HGB 13.1 14.0  HCT 41.1 44.1  MCV 90.1 89.5  PLT 262 294   Cardiac Enzymes: Recent Labs    04/02/23 1850 04/02/23 2324  TROPONINIHS 36* 35*   BNP: Recent Labs    04/02/23 1850  BNP 1,707.7*   D-Dimer: Recent Labs    04/03/23 0506  DDIMER 0.41   Hemoglobin A1C: No results for input(s): "HGBA1C" in the last 72 hours. Fasting Lipid Panel: No results for input(s): "CHOL", "HDL", "LDLCALC", "TRIG", "CHOLHDL", "LDLDIRECT" in the last 72 hours. Thyroid Function Tests: Recent Labs    04/02/23 1850  TSH 6.657*   Anemia Panel: No results for input(s): "VITAMINB12", "FOLATE", "FERRITIN", "TIBC", "IRON", "RETICCTPCT" in the last 72 hours.    Radiology: CT CHEST WO CONTRAST Result Date: 04/03/2023 CLINICAL DATA:  Chronic dyspnea with unclear etiology EXAM: CT CHEST WITHOUT CONTRAST TECHNIQUE: Multidetector CT imaging of the chest was performed following the standard protocol without IV contrast. RADIATION DOSE REDUCTION: This exam was performed according to the departmental dose-optimization program which includes automated exposure control, adjustment of the mA and/or kV according to patient size and/or use of iterative reconstruction technique. COMPARISON:  Radiograph from yesterday FINDINGS: Cardiovascular: Cardiac enlargement. Small low-density pericardial effusion measuring up to 9 mm in thickness posteriorly. There is atheromatous calcification. Prominent mitral annular calcification. Lead less pacer at the right ventricular apex. Mediastinum/Nodes: No worrisome lymph node enlargement, mass, or esophageal thickening. Lungs/Pleura: Biapical pleural based scarring. Central airways are clear. Minor dependent atelectasis. There is no consolidation, effusion, or pneumothorax. Slight interlobular septal thickening best seen on coronal reformats. Upper Abdomen: Cholecystectomy clips and atheromatous calcification. Musculoskeletal: T11 compression fracture with moderate height loss, nonacute and known from 2023 MRI. IMPRESSION: 1. Slight thickening of the interstitium may be minimal pulmonary edema. There is chronic cardiomegaly with small pericardial effusion. 2. Negative for pneumonia. Electronically Signed   By: Tiburcio Pea M.D.   On: 04/03/2023 07:28   DG Chest 2 View Result Date: 04/02/2023 CLINICAL DATA:  Dyspnea, atrial fibrillation EXAM: CHEST - 2 VIEW COMPARISON:  03/23/2023 FINDINGS: The lungs are symmetrically well expanded. Small left pleural effusion is present. Chronic interstitial thickening again noted though superimposed interstitial pulmonary edema has resolved. No pneumothorax. Stable cardiomegaly. Leadless pacemaker again  noted. No acute bone abnormality. IMPRESSION: 1. Small left pleural effusion. 2. Cardiomegaly. 3. Resolved pulmonary edema Electronically Signed   By: Helyn Numbers M.D.   On: 04/02/2023 19:41   DG Chest 1 View Result Date: 03/23/2023 CLINICAL DATA:  89 year old female with altered mental status, CHF. EXAM: CHEST  1 VIEW COMPARISON:  Portable chest yesterday and earlier. FINDINGS: Portable AP semi upright view at 0900 hours. Mildly improved lung volumes. Stable cardiomegaly and mediastinal contours. Left chest loop recorder or superficial ICD redemonstrated. Small pleural effusions are less apparent, lung base ventilation mildly improved. Stable pulmonary vascularity which has regressed since last month. No pneumothorax. No areas of worsening ventilation. Stable visualized osseous structures. Negative visible bowel gas. IMPRESSION: Cardiomegaly with  mildly improved lung volumes and lung base ventilation. Suspected small pleural effusions less apparent. Stable vascular congestion, improved from last month. Electronically Signed   By: Odessa Fleming M.D.   On: 03/23/2023 09:20   CT HEAD WO CONTRAST ( ) Result Date: 03/23/2023 CLINICAL DATA:  Altered mental status EXAM: CT HEAD WITHOUT CONTRAST TECHNIQUE: Contiguous axial images were obtained from the base of the skull through the vertex without intravenous contrast. RADIATION DOSE REDUCTION: This exam was performed according to the departmental dose-optimization program which includes automated exposure control, adjustment of the mA and/or kV according to patient size and/or use of iterative reconstruction technique. COMPARISON:  None Available. FINDINGS: Brain: There is no mass, hemorrhage or extra-axial collection. There is generalized atrophy without lobar predilection. Hypodensity of the white matter is most commonly associated with chronic microvascular disease. Vascular: No hyperdense vessel or unexpected vascular calcification. Skull: The visualized skull base,  calvarium and extracranial soft tissues are normal. Sinuses/Orbits: No fluid levels or advanced mucosal thickening of the visualized paranasal sinuses. No mastoid or middle ear effusion. Normal orbits. Other: None. IMPRESSION: 1. No acute intracranial abnormality. 2. Generalized atrophy and findings of chronic microvascular disease. Electronically Signed   By: Deatra Robinson M.D.   On: 03/23/2023 03:12   DG Chest Port 1 View Result Date: 03/22/2023 CLINICAL DATA:  Cough EXAM: PORTABLE CHEST 1 VIEW COMPARISON:  03/14/2023 FINDINGS: Moderate cardiomegaly with multifocal interstitial opacity, unchanged. No pleural effusion or pneumothorax. No focal airspace consolidation. IMPRESSION: Moderate cardiomegaly with multifocal interstitial opacity, unchanged and likely chronic. Electronically Signed   By: Deatra Robinson M.D.   On: 03/22/2023 22:36   US Venous Img Lower Unilateral Left (DVT) Result Date: 03/15/2023 CLINICAL DATA:  Progressive left lower extremity edema EXAM: LEFT LOWER EXTREMITY VENOUS DOPPLER ULTRASOUND TECHNIQUE: Gray-scale sonography with graded compression, as well as color Doppler and duplex ultrasound were performed to evaluate the lower extremity deep venous systems from the level of the common femoral vein and including the common femoral, femoral, profunda femoral, popliteal and calf veins including the posterior tibial, peroneal and gastrocnemius veins when visible. The superficial great saphenous vein was also interrogated. Spectral Doppler was utilized to evaluate flow at rest and with distal augmentation maneuvers in the common femoral, femoral and popliteal veins. COMPARISON:  None Available. FINDINGS: Contralateral Common Femoral Vein: Respiratory phasicity is normal and symmetric with the symptomatic side. No evidence of thrombus. Normal compressibility. Common Femoral Vein: No evidence of acute thrombus. Mild eccentric wall thickening with echogenicity likely representing calcified residua  of prior DVT. Saphenofemoral Junction: No evidence of thrombus. Normal compressibility and flow on color Doppler imaging. Profunda Femoral Vein: No evidence of thrombus. Normal compressibility and flow on color Doppler imaging. Femoral Vein: No evidence of thrombus. Normal compressibility, respiratory phasicity and response to augmentation. Popliteal Vein: No evidence of thrombus. Normal compressibility, respiratory phasicity and response to augmentation. Calf Veins: No evidence of thrombus. Normal compressibility and flow on color Doppler imaging. Superficial Great Saphenous Vein: No evidence of thrombus. Normal compressibility. Venous Reflux:  None. Other Findings:  None. IMPRESSION: 1. No evidence of acute DVT in the left lower extremity. 2. Mild sequelae of recanalized chronic DVT in the left common femoral vein noted incidentally. Electronically Signed   By: Malachy Moan M.D.   On: 03/15/2023 16:13   DG Chest 2 View Result Date: 03/14/2023 CLINICAL DATA:  Cough and shortness of breath EXAM: CHEST - 2 VIEW COMPARISON:  X-ray 11/26/2022. and older FINDINGS: Hyperinflation with enlarged cardiopericardial  silhouette calcified aorta. Diffuse interstitial changes again identified. Tiny pleural effusions. No pneumothorax or consolidation. Loop recorder overlies the lower left thorax. Degenerative changes of spine. There is moderate wedge deformity of a lower thoracic level which is unchanged from previous. Surgical clips right upper quadrant of the abdomen. IMPRESSION: Enlarged heart with vascular congestion and diffuse interstitial changes, similar to previous. Acute versus chronic. Tiny pleural effusions. Stable wedge deformity of the lower thoracic spine vertebral level with osteopenia Electronically Signed   By: Karen Kays M.D.   On: 03/14/2023 16:58    ECHO 01/2023: MILD LEFT VENTRICULAR SYSTOLIC DYSFUNCTION WITH MILD LVH  ESTIMATED EF: 45%  DIASTOLIC FUNCTION CAN'T BE DETERMINED  NORMAL RIGHT  VENTRICULAR SYSTOLIC FUNCTION  VALVULAR REGURGITATION: MILD AR, MODERATE MR, MILD PR, MILD TR  VALVULAR STENOSIS: No AS, MODERATE MS, No PS, No TS   TELEMETRY reviewed by me 04/04/2023: atrial fibrillation rate 90-100s  EKG reviewed by me: atrial fibrillation RBBB rate 108 bpm  Data reviewed by me 04/04/2023: last 24h vitals tele labs imaging I/O hospitalist progress note  Principal Problem:   CHF (congestive heart failure) (HCC) Active Problems:   Dyspnea   Atrial fibrillation status post cardioversion (HCC)   Chronic kidney disease, stage 3b (HCC)   ILD (interstitial lung disease) (HCC)   CHF exacerbation (HCC)    ASSESSMENT AND PLAN:  Frances Kent is a 89 y.o. female  with a past medical history of chronic atrial fibrillation on xarelto, HFrEF (EF 40-45%), severe MR, hypertension, history of PE, hypothyroidism, GERD, Esophageal stricture w/ Dilation in 2021 who presented to the ED on 04/03/2023 for elevated heart rate. Cardiology was consulted for further evaluation.   # Atrial fibrillation RVR # Chronic atrial fibrillation Patient reports elevated heart rates brought her to the ED.  She was found to be in A-fib RVR.  Given p.o. metoprolol with improvement in heart rate. -Increase metoprolol succinate to 75 mg twice daily. -Continue Xarelto 15 mg daily for stroke risk reduction.  # Acute on chronic HFrEF # Hypertension Patient reports that she has had mildly worsening shortness of breath.  Few days.  BNP elevated at 1700.  Started on p.o. diuretics in the ED. -Continue IV Lasix 40 mg for now, can consider transition to po tomorrow.  -Continue lisinopril 20 mg daily, see plan for metoprolol above.  Further additions to GDMT limited by BP and renal function. -Strict I's and O's, daily weights. -Monitor renal function closely.   Can consider DC home later today if she ambulates well and renal function stable.  This patient's plan of care was discussed and created with Dr.  Corky Sing and he is in agreement.  Signed: Gale Journey, PA-C  04/04/2023, 9:23 AM [facility]

## 2023-04-04 NOTE — ED Notes (Signed)
RN to bedside to introduce self to pt. Pt sleeping. Family at bedside.

## 2023-04-04 NOTE — ED Notes (Signed)
Lt green sent to lab

## 2023-04-04 NOTE — ED Notes (Addendum)
Pt ambulated with walker to bathroom. Pt cleaned up. New chux placed as hers was wet. Purewick was cold and had blood on it. New one placed.

## 2023-04-04 NOTE — Assessment & Plan Note (Signed)
Initially diagnosed on 2/5.

## 2023-04-04 NOTE — Discharge Summary (Signed)
Physician Discharge Summary   Patient: Frances Kent MRN: [id number] DOB: [birth year]  Admit date:     04/03/2023  Discharge date: 04/04/23  Discharge Physician: Alford Highland   PCP: Sherlene Shams, MD   Recommendations at discharge:   Follow-up PCP 5 days Follow-up Dr. Darrold Junker 1 week  Discharge Diagnoses: Active Problems:   Acute on chronic systolic CHF (congestive heart failure) (HCC)   Chronic atrial fibrillation with RVR (HCC)   ILD (interstitial lung disease) (HCC)   Chronic kidney disease, stage 3b (HCC)   COVID-19 virus infection    Hospital Course: 89 year old female past medical history of chronic heart failure with reduced ejection fraction, hypertension, macular degeneration, interstitial lung disease, atrial fibrillation.  She presents with shortness of breath.  Recently discharged from the hospital on 2/8 where she had clinical sepsis and COVID-19 infection and heart failure exacerbation.  Patient coming in with persistent shortness of breath over multiple days and was given IV Lasix in the emergency room.  2/18.  Patient feeling better and wanting to go home.  Seen by cardiology and they increased patient's Toprol to 75 mg twice a day and continue Xarelto for anticoagulation.  Can go back on her torsemide 20 mg as outpatient.  Assessment and Plan: Acute on chronic systolic CHF (congestive heart failure) (HCC) Patient was diuresed with IV Lasix.  Patient feeling better and wanting to go home.  Last ejection fraction 40 to 45% on previous echocardiogram with moderate mitral regurgitation.  Patient is on lisinopril and Toprol dose increased.  Can go back on p.o. torsemide as outpatient.  Chronic atrial fibrillation with RVR [facility]) Cardiology increase Toprol to 75 mg twice daily.  Continue Xarelto.  ILD (interstitial lung disease) (HCC) Baseline ILD  Patient off oxygen  Chronic kidney disease, stage 3b (HCC) Creatinine 1.23 with IV diuresis.  Check BMP and  follow-up appointment.  COVID-19 virus infection Initially diagnosed on 2/5.         Consultants: Cardiology Procedures performed: None Disposition: Home Diet recommendation:  Cardiac diet DISCHARGE MEDICATION: Allergies as of 04/04/2023       Reactions   Statins Hives   Severe myalgias        Medication List     TAKE these medications    acetaminophen 500 MG tablet Commonly known as: TYLENOL Take 500 mg by mouth every 6 (six) hours as needed.   levalbuterol 0.63 MG/3ML nebulizer solution Commonly known as: XOPENEX USE 1  VIAL IN NEBULIZER EVERY 4 HOURS AS NEEDED FOR WHEEZING AND FOR SHORTNESS OF BREATH   levothyroxine 50 MCG tablet Commonly known as: SYNTHROID Take 1 tablet (50 mcg total) by mouth daily before breakfast.   lisinopril 20 MG tablet Commonly known as: ZESTRIL Take 1 tablet (20 mg total) by mouth daily.   metoprolol succinate 25 MG 24 hr tablet Commonly known as: TOPROL-XL Take 3 tablets (75 mg total) by mouth 2 (two) times daily. Take with or immediately following a meal.   pantoprazole 20 MG tablet Commonly known as: PROTONIX Take 20 mg by mouth daily.   potassium chloride SA 20 MEQ tablet Commonly known as: KLOR-CON M Take 1 tablet (20 mEq total) by mouth daily.   Probiotic Chew Chew 2 tablets by mouth daily.   torsemide 20 MG tablet Commonly known as: DEMADEX Take 1 tablet (20 mg total) by mouth daily.   traZODone 50 MG tablet Commonly known as: DESYREL Take 0.5-1 tablets (25-50 mg total) by mouth at bedtime as needed for  sleep.   Xarelto 15 MG Tabs tablet Generic drug: Rivaroxaban TAKE 1 TABLET EVERY DAY        Follow-up Information     Paraschos, Alexander, MD. Go in 1 week(s).   Specialty: Cardiology Contact information: [address] [facility] [place] [address] [phone]         Sherlene Shams, MD Follow up in 5 day(s).   Specialty: Internal Medicine Contact  information: [address] [place] [address] [phone]                Discharge Exam: Ceasar Mons Weights   04/02/23 1839  Weight: 51.4 kg   Physical Exam HENT:     Head: Normocephalic.     Mouth/Throat:     Pharynx: No oropharyngeal exudate.  Eyes:     General: Lids are normal.     Conjunctiva/sclera: Conjunctivae normal.  Cardiovascular:     Rate and Rhythm: Tachycardia present. Rhythm irregularly irregular.     Heart sounds: S1 normal and S2 normal. Murmur heard.     Systolic murmur is present with a grade of 2/6.  Pulmonary:     Breath sounds: Examination of the right-lower field reveals decreased breath sounds. Examination of the left-lower field reveals decreased breath sounds. Decreased breath sounds present. No wheezing, rhonchi or rales.  Abdominal:     Palpations: Abdomen is soft.     Tenderness: There is no abdominal tenderness.  Musculoskeletal:     Right lower leg: No swelling.     Left lower leg: No swelling.  Skin:    General: Skin is warm.     Findings: No rash.  Neurological:     Mental Status: She is alert and oriented to person, place, and time.      Condition at discharge: stable  The results of significant diagnostics from this hospitalization (including imaging, microbiology, ancillary and laboratory) are listed below for reference.   Imaging Studies: CT CHEST WO CONTRAST Result Date: 04/03/2023 CLINICAL DATA:  Chronic dyspnea with unclear etiology EXAM: CT CHEST WITHOUT CONTRAST TECHNIQUE: Multidetector CT imaging of the chest was performed following the standard protocol without IV contrast. RADIATION DOSE REDUCTION: This exam was performed according to the departmental dose-optimization program which includes automated exposure control, adjustment of the mA and/or kV according to patient size and/or use of iterative reconstruction technique. COMPARISON:  Radiograph from yesterday FINDINGS: Cardiovascular: Cardiac  enlargement. Small low-density pericardial effusion measuring up to 9 mm in thickness posteriorly. There is atheromatous calcification. Prominent mitral annular calcification. Lead less pacer at the right ventricular apex. Mediastinum/Nodes: No worrisome lymph node enlargement, mass, or esophageal thickening. Lungs/Pleura: Biapical pleural based scarring. Central airways are clear. Minor dependent atelectasis. There is no consolidation, effusion, or pneumothorax. Slight interlobular septal thickening best seen on coronal reformats. Upper Abdomen: Cholecystectomy clips and atheromatous calcification. Musculoskeletal: T11 compression fracture with moderate height loss, nonacute and known from 2023 MRI. IMPRESSION: 1. Slight thickening of the interstitium may be minimal pulmonary edema. There is chronic cardiomegaly with small pericardial effusion. 2. Negative for pneumonia. Electronically Signed   By: Tiburcio Pea M.D.   On: 04/03/2023 07:28   DG Chest 2 View Result Date: 04/02/2023 CLINICAL DATA:  Dyspnea, atrial fibrillation EXAM: CHEST - 2 VIEW COMPARISON:  03/23/2023 FINDINGS: The lungs are symmetrically well expanded. Small left pleural effusion is present. Chronic interstitial thickening again noted though superimposed interstitial pulmonary edema has resolved. No pneumothorax. Stable cardiomegaly. Leadless pacemaker again noted. No acute  bone abnormality. IMPRESSION: 1. Small left pleural effusion. 2. Cardiomegaly. 3. Resolved pulmonary edema Electronically Signed   By: Helyn Numbers M.D.   On: 04/02/2023 19:41   DG Chest 1 View Result Date: 03/23/2023 CLINICAL DATA:  89 year old female with altered mental status, CHF. EXAM: CHEST  1 VIEW COMPARISON:  Portable chest yesterday and earlier. FINDINGS: Portable AP semi upright view at 0900 hours. Mildly improved lung volumes. Stable cardiomegaly and mediastinal contours. Left chest loop recorder or superficial ICD redemonstrated. Small pleural effusions  are less apparent, lung base ventilation mildly improved. Stable pulmonary vascularity which has regressed since last month. No pneumothorax. No areas of worsening ventilation. Stable visualized osseous structures. Negative visible bowel gas. IMPRESSION: Cardiomegaly with mildly improved lung volumes and lung base ventilation. Suspected small pleural effusions less apparent. Stable vascular congestion, improved from last month. Electronically Signed   By: Odessa Fleming M.D.   On: 03/23/2023 09:20   CT HEAD WO CONTRAST ( ) Result Date: 03/23/2023 CLINICAL DATA:  Altered mental status EXAM: CT HEAD WITHOUT CONTRAST TECHNIQUE: Contiguous axial images were obtained from the base of the skull through the vertex without intravenous contrast. RADIATION DOSE REDUCTION: This exam was performed according to the departmental dose-optimization program which includes automated exposure control, adjustment of the mA and/or kV according to patient size and/or use of iterative reconstruction technique. COMPARISON:  None Available. FINDINGS: Brain: There is no mass, hemorrhage or extra-axial collection. There is generalized atrophy without lobar predilection. Hypodensity of the white matter is most commonly associated with chronic microvascular disease. Vascular: No hyperdense vessel or unexpected vascular calcification. Skull: The visualized skull base, calvarium and extracranial soft tissues are normal. Sinuses/Orbits: No fluid levels or advanced mucosal thickening of the visualized paranasal sinuses. No mastoid or middle ear effusion. Normal orbits. Other: None. IMPRESSION: 1. No acute intracranial abnormality. 2. Generalized atrophy and findings of chronic microvascular disease. Electronically Signed   By: Deatra Robinson M.D.   On: 03/23/2023 03:12   DG Chest Port 1 View Result Date: 03/22/2023 CLINICAL DATA:  Cough EXAM: PORTABLE CHEST 1 VIEW COMPARISON:  03/14/2023 FINDINGS: Moderate cardiomegaly with multifocal interstitial  opacity, unchanged. No pleural effusion or pneumothorax. No focal airspace consolidation. IMPRESSION: Moderate cardiomegaly with multifocal interstitial opacity, unchanged and likely chronic. Electronically Signed   By: Deatra Robinson M.D.   On: 03/22/2023 22:36   US Venous Img Lower Unilateral Left (DVT) Result Date: 03/15/2023 CLINICAL DATA:  Progressive left lower extremity edema EXAM: LEFT LOWER EXTREMITY VENOUS DOPPLER ULTRASOUND TECHNIQUE: Gray-scale sonography with graded compression, as well as color Doppler and duplex ultrasound were performed to evaluate the lower extremity deep venous systems from the level of the common femoral vein and including the common femoral, femoral, profunda femoral, popliteal and calf veins including the posterior tibial, peroneal and gastrocnemius veins when visible. The superficial great saphenous vein was also interrogated. Spectral Doppler was utilized to evaluate flow at rest and with distal augmentation maneuvers in the common femoral, femoral and popliteal veins. COMPARISON:  None Available. FINDINGS: Contralateral Common Femoral Vein: Respiratory phasicity is normal and symmetric with the symptomatic side. No evidence of thrombus. Normal compressibility. Common Femoral Vein: No evidence of acute thrombus. Mild eccentric wall thickening with echogenicity likely representing calcified residua of prior DVT. Saphenofemoral Junction: No evidence of thrombus. Normal compressibility and flow on color Doppler imaging. Profunda Femoral Vein: No evidence of thrombus. Normal compressibility and flow on color Doppler imaging. Femoral Vein: No evidence of thrombus. Normal compressibility, respiratory phasicity and  response to augmentation. Popliteal Vein: No evidence of thrombus. Normal compressibility, respiratory phasicity and response to augmentation. Calf Veins: No evidence of thrombus. Normal compressibility and flow on color Doppler imaging. Superficial Great Saphenous Vein:  No evidence of thrombus. Normal compressibility. Venous Reflux:  None. Other Findings:  None. IMPRESSION: 1. No evidence of acute DVT in the left lower extremity. 2. Mild sequelae of recanalized chronic DVT in the left common femoral vein noted incidentally. Electronically Signed   By: Malachy Moan M.D.   On: 03/15/2023 16:13   DG Chest 2 View Result Date: 03/14/2023 CLINICAL DATA:  Cough and shortness of breath EXAM: CHEST - 2 VIEW COMPARISON:  X-ray 11/26/2022. and older FINDINGS: Hyperinflation with enlarged cardiopericardial silhouette calcified aorta. Diffuse interstitial changes again identified. Tiny pleural effusions. No pneumothorax or consolidation. Loop recorder overlies the lower left thorax. Degenerative changes of spine. There is moderate wedge deformity of a lower thoracic level which is unchanged from previous. Surgical clips right upper quadrant of the abdomen. IMPRESSION: Enlarged heart with vascular congestion and diffuse interstitial changes, similar to previous. Acute versus chronic. Tiny pleural effusions. Stable wedge deformity of the lower thoracic spine vertebral level with osteopenia Electronically Signed   By: Karen Kays M.D.   On: 03/14/2023 16:58    Microbiology: Results for orders placed or performed during the hospital encounter of 04/03/23  Resp panel by RT-PCR (RSV, Flu A&B, Covid) Anterior Nasal Swab     Status: Abnormal   Collection Time: 04/03/23  3:10 AM   Specimen: Anterior Nasal Swab  Result Value Ref Range Status   SARS Coronavirus 2 by RT PCR POSITIVE (A) NEGATIVE Final    Comment: (NOTE) SARS-CoV-2 target nucleic acids are DETECTED.  The SARS-CoV-2 RNA is generally detectable in upper respiratory specimens during the acute phase of infection. Positive results are indicative of the presence of the identified virus, but do not rule out bacterial infection or co-infection with other pathogens not detected by the test. Clinical correlation with patient  history and other diagnostic information is necessary to determine patient infection status. The expected result is Negative.  Fact Sheet for Patients: BloggerCourse.com  Fact Sheet for Healthcare Providers: SeriousBroker.it  This test is not yet approved or cleared by the [place] FDA and  has been authorized for detection and/or diagnosis of SARS-CoV-2 by FDA under an Emergency Use Authorization (EUA).  This EUA will remain in effect (meaning this test can be used) for the duration of  the COVID-19 declaration under Section 564(b)(1) of the A ct, 21 U.S.C. section 360bbb-3(b)(1), unless the authorization is terminated or revoked sooner.     Influenza A by PCR NEGATIVE NEGATIVE Final   Influenza B by PCR NEGATIVE NEGATIVE Final    Comment: (NOTE) The Xpert Xpress SARS-CoV-2/FLU/RSV plus assay is intended as an aid in the diagnosis of influenza from Nasopharyngeal swab specimens and should not be used as a sole basis for treatment. Nasal washings and aspirates are unacceptable for Xpert Xpress SARS-CoV-2/FLU/RSV testing.  Fact Sheet for Patients: BloggerCourse.com  Fact Sheet for Healthcare Providers: SeriousBroker.it  This test is not yet approved or cleared by the [place] FDA and has been authorized for detection and/or diagnosis of SARS-CoV-2 by FDA under an Emergency Use Authorization (EUA). This EUA will remain in effect (meaning this test can be used) for the duration of the COVID-19 declaration under Section 564(b)(1) of the Act, 21 U.S.C. section 360bbb-3(b)(1), unless the authorization is terminated or revoked.  Resp Syncytial Virus by PCR NEGATIVE NEGATIVE Final    Comment: (NOTE) Fact Sheet for Patients: BloggerCourse.com  Fact Sheet for Healthcare Providers: SeriousBroker.it  This test is not yet  approved or cleared by the [place] FDA and has been authorized for detection and/or diagnosis of SARS-CoV-2 by FDA under an Emergency Use Authorization (EUA). This EUA will remain in effect (meaning this test can be used) for the duration of the COVID-19 declaration under Section 564(b)(1) of the Act, 21 U.S.C. section 360bbb-3(b)(1), unless the authorization is terminated or revoked.  Performed at [facility], [address] [place], [place] [address]     Labs: CBC: Recent Labs  Lab 04/02/23 1850 04/04/23 0230  WBC 7.1 10.8*  HGB 13.1 14.0  HCT 41.1 44.1  MCV 90.1 89.5  PLT 262 294   Basic Metabolic Panel: Recent Labs  Lab 03/31/23 1033 04/02/23 1850 04/04/23 0925  NA 139 139 138  K 3.2* 4.3 3.7  CL 96 99 98  CO2 32 27 26  GLUCOSE 73 106* 124*  BUN 15 17 20   CREATININE 1.17 1.13* 1.23*  CALCIUM 8.8 9.2 8.9  MG  --  2.5*  --    Liver Function Tests: No results for input(s): "AST", "ALT", "ALKPHOS", "BILITOT", "PROT", "ALBUMIN" in the last 168 hours. CBG: No results for input(s): "GLUCAP" in the last 168 hours.  Discharge time spent: greater than 30 minutes.  Signed: Alford Highland, MD [facility] 04/04/2023

## 2023-04-04 NOTE — Hospital Course (Signed)
89 year old female past medical history of chronic heart failure with reduced ejection fraction, hypertension, macular degeneration, interstitial lung disease, atrial fibrillation.  She presents with shortness of breath.  Recently discharged from the hospital on 2/8 where she had clinical sepsis and COVID-19 infection and heart failure exacerbation.  Patient coming in with persistent shortness of breath over multiple days and was given IV Lasix in the emergency room.  2/18.  Patient feeling better and wanting to go home.  Seen by cardiology and they increased patient's Toprol to 75 mg twice a day and continue Xarelto for anticoagulation.  Can go back on her torsemide 20 mg as outpatient.

## 2023-04-05 ENCOUNTER — Inpatient Hospital Stay: Payer: Medicare HMO | Admitting: Internal Medicine

## 2023-04-06 ENCOUNTER — Telehealth: Payer: Self-pay

## 2023-04-06 ENCOUNTER — Ambulatory Visit: Payer: Medicare HMO | Admitting: Pulmonary Disease

## 2023-04-06 NOTE — Transitions of Care (Post Inpatient/ED Visit) (Signed)
04/06/2023  Name: Frances Kent MRN: [id number] DOB: [birth year]  Today's TOC FU Call Status: Today's TOC FU Call Status:: Successful TOC FU Call Completed TOC FU Call Complete Date: 04/06/23 Patient's Name and Date of Birth confirmed.  Transition Care Management Follow-up Telephone Call Date of Discharge: 04/04/23 Discharge Facility: [facility]) Type of Discharge: Emergency Department Primary Inpatient Discharge Diagnosis:: CHF How have you been since you were released from the hospital?: Better Any questions or concerns?: No  Items Reviewed: Did you receive and understand the discharge instructions provided?: Yes Medications obtained,verified, and reconciled?: Yes (Medications Reviewed) Any new allergies since your discharge?: No Dietary orders reviewed?: Yes Do you have support at home?: Yes People in Home: child(ren), adult  Medications Reviewed Today: Medications Reviewed Today     Reviewed by Karena Addison, LPN (Licensed Practical Nurse) on 04/06/23 at 1217  Med List Status: <None>   Medication Order Taking? Sig Documenting Provider Last Dose Status Informant  acetaminophen (TYLENOL) 500 MG tablet [id number] No Take 500 mg by mouth every 6 (six) hours as needed. [provider] Past Week Active Child  levalbuterol (XOPENEX) 0.63 MG/3ML nebulizer solution [id number] No USE 1  VIAL IN NEBULIZER EVERY 4 HOURS AS NEEDED FOR WHEEZING AND FOR SHORTNESS OF Georgette Dover, MD Past Week Active Child  levothyroxine (SYNTHROID) 50 MCG tablet [id number] No Take 1 tablet (50 mcg total) by mouth daily before breakfast. Sherlene Shams, MD 04/02/2023 Active Child  lisinopril (ZESTRIL) 20 MG tablet [id number] No Take 1 tablet (20 mg total) by mouth daily. Debarah Crape, DO 04/02/2023 Active Child  metoprolol succinate (TOPROL-XL) 25 MG 24 hr tablet [id number]  Take 3 tablets (75 mg total) by mouth 2 (two) times daily. Take with or  immediately following a meal. Alford Highland, MD  Active   pantoprazole (PROTONIX) 20 MG tablet [id number] No Take 20 mg by mouth daily. [provider] 04/02/2023 Active Child  potassium chloride SA (KLOR-CON M) 20 MEQ tablet [id number] No Take 1 tablet (20 mEq total) by mouth daily. Sherlene Shams, MD 04/02/2023 Active Child  Probiotic CHEW [id number] No Chew 2 tablets by mouth daily. [provider] 04/02/2023 Active Child  torsemide (DEMADEX) 20 MG tablet [id number] No Take 1 tablet (20 mg total) by mouth daily. Sherlene Shams, MD 04/02/2023 Active Child  traZODone (DESYREL) 50 MG tablet [id number] No Take 0.5-1 tablets (25-50 mg total) by mouth at bedtime as needed for sleep. Sherlene Shams, MD 04/02/2023 Active Child  XARELTO 15 MG TABS tablet [id number] No TAKE 1 TABLET EVERY DAY Sherlene Shams, MD 04/02/2023 Active Child            Home Care and Equipment/Supplies: Were Home Health Services Ordered?: NA Any new equipment or medical supplies ordered?: NA  Functional Questionnaire: Do you need assistance with bathing/showering or dressing?: No Do you need assistance with meal preparation?: Yes Do you need assistance with eating?: No Do you have difficulty maintaining continence: No Do you need assistance with getting out of bed/getting out of a chair/moving?: No Do you have difficulty managing or taking your medications?: No  Follow up appointments reviewed: PCP Follow-up appointment confirmed?: Yes Date of PCP follow-up appointment?: 04/12/23 Follow-up Provider: [facility] Follow-up appointment confirmed?: NA Do you need transportation to your follow-up appointment?: No Do you understand care options if your condition(s) worsen?: Yes-patient verbalized understanding    SIGNATURE Karena Addison, LPN [facility] Nurse Health Advisor Direct Dial [phone]

## 2023-04-11 ENCOUNTER — Ambulatory Visit: Payer: Self-pay | Admitting: Internal Medicine

## 2023-04-11 ENCOUNTER — Ambulatory Visit: Payer: Self-pay

## 2023-04-11 ENCOUNTER — Telehealth: Payer: Self-pay | Admitting: Internal Medicine

## 2023-04-11 NOTE — Telephone Encounter (Signed)
 Copied from CRM [id number]. Topic: General - Other >> Apr 11, 2023 12:04 PM Denese Killings wrote: Reason for CRM: Elita Quick is returning a call from someone in clinic and is requesting callback.

## 2023-04-11 NOTE — Telephone Encounter (Signed)
 Pt's daughter was advised to take pt to the ED for abnormal heart rate and possibly being back in Afib. Pt's daughter stated that she would try to get her mom to go to the ED but would like to schedule an in person appt. Pt is scheduled to see cardiology tomorrow at 2 and you tomorrow at 4.

## 2023-04-11 NOTE — Telephone Encounter (Signed)
 This RN received a call from [place] at [employer]. Erie Noe stated that a CRM (summary below) was sent to them. Office is requesting patient be triaged.   CRM Summary: Patient is in a-fib really bad she is not feeling good she has a Appt scheduled for tomorrow but her daughter wanted to know if she could be seen sooner then what was scheduled she would like a call back regarding this.   Chief Complaint: Afib Symptoms: Fatigue Frequency: Today  Pertinent Negatives: Patient denies chest pain Disposition: [x] ED /[] Urgent Care (no appt availability in office) / [] Appointment(In office/virtual)/ []  [facility]/ [] Home Care/ [] Refused Recommended Disposition /[]  Mobile Bus/ []  Follow-up with PCP Additional Notes: Spoke to patient's daughter, Rinaldo Cloud, on behalf of her mother who is experiencing an exacerbation of Afib. Patient went to the ED on 04/03/23 for shortness of breathing and fluctuating HR. Daughter reported that patient had been home for a few days and experienced relief from symptoms. Daughter started that patient's HR started has been "all over the place" again today. Daughter reported that the patient's HR has been as low as 43 and as high as 180. Daughter reported that her mother's HR has been changing from one extreme to the other frequently and sporadically throughout the day. Daughter reported that her mother is feeling "sick" and fatigued. Patient has a pacemaker and daughter reported that she does not believe it is working. Daughter stated that her mother has not complained of the following: chest pain, SOB, sweating and dizziness. Patient has a history of chronic Afib and additional heart complications. This RN advised ED at this time, per protocol. Daughter stated that she would attempt to convince her mother to go. This RN advised that I would route this conversation to the office. This RN advised patient/daughter to call back if anything changes.     Reason for Disposition  Heart beating < 60 beats per minute OR > 140 beats per minute  (Exception: Heart rate is normal for patient, such as in a well-trained athlete or during vigorous exercise.)  Answer Assessment - Initial Assessment Questions 1. DESCRIPTION: "Please describe your heart rate or heartbeat that you are having" (e.g., fast/slow, regular/irregular, skipped or extra beats, "palpitations")     Daughter states patient's heart is not skipping beats, states HR increases and decreases sporadically 2. ONSET: "When did it start?" (Minutes, hours or days)      2-3 weeks after hospital admission for COVID 3. DURATION: "How long does it last" (e.g., seconds, minutes, hours)     Throughout the entire day 4. PATTERN "Does it come and go, or has it been constant since it started?"  "Does it get worse with exertion?"   "Are you feeling it now?"     States HR is constantly changing, states HR gets extremely low and extremely high 5. TAP: "Using your hand, can you tap out what you are feeling on a chair or table in front of you, so that I can hear?" (Note: not all patients can do this)       N/A 6. HEART RATE: "Can you tell me your heart rate?" "How many beats in 15 seconds?"  (Note: not all patients can do this)       HR ranges 43-180 8. CAUSE: "What do you think is causing the palpitations?"     Afib 9. CARDIAC HISTORY: "Do you have any history of heart disease?" (e.g., heart attack, angina, bypass surgery, angioplasty, arrhythmia)  CHF, chronic Afib, HTN, has a pacemaker (see list for additional history) 10. OTHER SYMPTOMS: "Do you have any other symptoms?" (e.g., dizziness, chest pain, sweating, difficulty breathing)      HR is "everywhere", weight loss, fatigue, denies chest pain, denies SOB, denies sweating, denies dizziness  Protocols used: Heart Rate and Heartbeat Questions-A-AH, ICD and Pacemaker Symptoms and Questions-A-AH

## 2023-04-11 NOTE — Patient Outreach (Signed)
 Care Coordination   04/11/2023 Name: Frances Kent MRN: [id number] DOB: 1930/10/11   Care Coordination Outreach Attempts:  An unsuccessful outreach was attempted for an appointment today. Unable to reach daughter or leave voice message Frances Kent.  Voice mail full per message.   Follow Up Plan:  No further outreach attempts will be made at this time. We have been unable to contact the patient to offer or enroll patient in complex care management services.  Encounter Outcome:  No Answer   Care Coordination Interventions:  No, not indicated    George Ina RN, BSN, CCM [employer], Population Health Case Manager Phone: [phone]

## 2023-04-11 NOTE — Telephone Encounter (Signed)
 Patient is in a-fib really bad she is not feeling good she has a Appt scheduled for tomorrow but her daughter wanted to know if she could be seen sooner then what was scheduled she would like a call back regarding this

## 2023-04-11 NOTE — Telephone Encounter (Signed)
 I called the triage nurse she will reach out to the patient.

## 2023-04-12 ENCOUNTER — Encounter: Payer: Self-pay | Admitting: Internal Medicine

## 2023-04-12 ENCOUNTER — Ambulatory Visit (INDEPENDENT_AMBULATORY_CARE_PROVIDER_SITE_OTHER): Payer: Medicare HMO | Admitting: Internal Medicine

## 2023-04-12 VITALS — BP 98/70 | HR 45 | Ht 63.0 in | Wt 102.0 lb

## 2023-04-12 DIAGNOSIS — N1832 Chronic kidney disease, stage 3b: Secondary | ICD-10-CM

## 2023-04-12 DIAGNOSIS — Z7189 Other specified counseling: Secondary | ICD-10-CM | POA: Diagnosis not present

## 2023-04-12 DIAGNOSIS — I5022 Chronic systolic (congestive) heart failure: Secondary | ICD-10-CM | POA: Diagnosis not present

## 2023-04-12 DIAGNOSIS — R634 Abnormal weight loss: Secondary | ICD-10-CM

## 2023-04-12 DIAGNOSIS — N1831 Chronic kidney disease, stage 3a: Secondary | ICD-10-CM | POA: Diagnosis not present

## 2023-04-12 DIAGNOSIS — I6310 Cerebral infarction due to embolism of unspecified precerebral artery: Secondary | ICD-10-CM | POA: Diagnosis not present

## 2023-04-12 DIAGNOSIS — I700 Atherosclerosis of aorta: Secondary | ICD-10-CM | POA: Diagnosis not present

## 2023-04-12 DIAGNOSIS — R531 Weakness: Secondary | ICD-10-CM | POA: Diagnosis not present

## 2023-04-12 DIAGNOSIS — I10 Essential (primary) hypertension: Secondary | ICD-10-CM | POA: Diagnosis not present

## 2023-04-12 DIAGNOSIS — I5023 Acute on chronic systolic (congestive) heart failure: Secondary | ICD-10-CM | POA: Diagnosis not present

## 2023-04-12 DIAGNOSIS — R001 Bradycardia, unspecified: Secondary | ICD-10-CM | POA: Diagnosis not present

## 2023-04-12 DIAGNOSIS — E440 Moderate protein-calorie malnutrition: Secondary | ICD-10-CM | POA: Diagnosis not present

## 2023-04-12 DIAGNOSIS — R627 Adult failure to thrive: Secondary | ICD-10-CM

## 2023-04-12 DIAGNOSIS — E861 Hypovolemia: Secondary | ICD-10-CM

## 2023-04-12 DIAGNOSIS — E039 Hypothyroidism, unspecified: Secondary | ICD-10-CM

## 2023-04-12 DIAGNOSIS — I050 Rheumatic mitral stenosis: Secondary | ICD-10-CM | POA: Diagnosis not present

## 2023-04-12 MED ORDER — PANTOPRAZOLE SODIUM 20 MG PO TBEC: 20.0000 mg | DELAYED_RELEASE_TABLET | Freq: Every day | ORAL | 1 refills | Status: DC

## 2023-04-12 MED ORDER — METOPROLOL SUCCINATE ER 25 MG PO TB24: 25.0000 mg | ORAL_TABLET | Freq: Two times a day (BID) | ORAL | Status: DC

## 2023-04-12 MED ORDER — FUROSEMIDE 20 MG PO TABS: 20.0000 mg | ORAL_TABLET | Freq: Every day | ORAL | 3 refills | Status: DC

## 2023-04-12 NOTE — Progress Notes (Unsigned)
 Subjective:  Patient ID: Frances Kent, female    DOB: [birth year]  Age: 89 y.o. MRN: [id number]  CC: There were no encounter diagnoses.   HPI Frances Kent presents for  Chief Complaint  Patient presents with   Hospitalization Follow-up  I feel terrible   Admitted from 2/6 to 2/8 with COVID,  encephalopathy . Sent home on paxlovid: eliquis and metoprolol suspended  Returned to ER on Feb 17 with dyspnea ,  atrial fib with  RVR . Admitted overnight and diuresed with oral torsemide   Her  has been fluctuating .  Saw [facility] this afternoon   and reduced her metoprolol dose r from 75 mg  bid to 25 mg bid .  Took 50 mg this morning and 50 mg last night. BP has been low . Also taking lisinopril since feb 17 hospitalization   Home weight today was 99 lbs.  Has been taking torsemide 20 mg daily .  GFR has dropped from > 60 ml/min to 44 ml/min on Feb 18  Needs refill on protonix     Outpatient Medications Prior to Visit  Medication Sig Dispense Refill   acetaminophen (TYLENOL) 500 MG tablet Take 500 mg by mouth every 6 (six) hours as needed.     levalbuterol (XOPENEX) 0.63 MG/3ML nebulizer solution USE 1  VIAL IN NEBULIZER EVERY 4 HOURS AS NEEDED FOR WHEEZING AND FOR SHORTNESS OF BREATH 150 mL 5   levothyroxine (SYNTHROID) 50 MCG tablet Take 1 tablet (50 mcg total) by mouth daily before breakfast. 90 tablet 0   lisinopril (ZESTRIL) 20 MG tablet Take 1 tablet (20 mg total) by mouth daily. 30 tablet 0   metoprolol succinate (TOPROL-XL) 25 MG 24 hr tablet Take 3 tablets (75 mg total) by mouth 2 (two) times daily. Take with or immediately following a meal. (Patient taking differently: Take 25 mg by mouth 2 (two) times daily. Take with or immediately following a meal.) 180 tablet 0   pantoprazole (PROTONIX) 20 MG tablet Take 20 mg by mouth daily.     potassium chloride SA (KLOR-CON M) 20 MEQ tablet Take 1 tablet (20 mEq total) by mouth daily. 5 tablet 0   Probiotic CHEW Chew 2 tablets by  mouth daily.     torsemide (DEMADEX) 20 MG tablet Take 1 tablet (20 mg total) by mouth daily. 30 tablet 0   traZODone (DESYREL) 50 MG tablet Take 0.5-1 tablets (25-50 mg total) by mouth at bedtime as needed for sleep. 30 tablet 3   XARELTO 15 MG TABS tablet TAKE 1 TABLET EVERY DAY 90 tablet 3   No facility-administered medications prior to visit.    Review of Systems;  Patient denies headache, fevers, malaise, unintentional weight loss, skin rash, eye pain, sinus congestion and sinus pain, sore throat, dysphagia,  hemoptysis , cough, dyspnea, wheezing, chest pain, palpitations, orthopnea, edema, abdominal pain, nausea, melena, diarrhea, constipation, flank pain, dysuria, hematuria, urinary  Frequency, nocturia, numbness, tingling, seizures,  Focal weakness, Loss of consciousness,  Tremor, insomnia, depression, anxiety, and suicidal ideation.      Objective:  BP 98/70   Pulse (!) 45   Ht 5\' 3"  (1.6 m)   Wt 102 lb (46.3 kg)   SpO2 95%   BMI 18.07 kg/m   BP Readings from Last 3 Encounters:  04/12/23 98/70  04/04/23 116/87  03/25/23 (!) 151/62    Wt Readings from Last 3 Encounters:  04/12/23 102 lb (46.3 kg)  04/02/23 113 lb 5.1 oz (51.4  kg)  03/24/23 113 lb 5.1 oz (51.4 kg)    Physical Exam  Lab Results  Component Value Date   HGBA1C 5.8 (H) 11/02/2020   HGBA1C 6.1 (H) 10/16/2020    Lab Results  Component Value Date   CREATININE 1.23 (H) 04/04/2023   CREATININE 1.13 (H) 04/02/2023   CREATININE 1.17 03/31/2023    Lab Results  Component Value Date   WBC 10.8 (H) 04/04/2023   HGB 14.0 04/04/2023   HCT 44.1 04/04/2023   PLT 294 04/04/2023   GLUCOSE 124 (H) 04/04/2023   CHOL 257 (H) 11/23/2021   TRIG 104.0 11/23/2021   HDL 96.40 11/23/2021   LDLDIRECT 185.3 03/27/2013   LDLCALC 140 (H) 11/23/2021   ALT 19 03/22/2023   AST 24 03/22/2023   NA 138 04/04/2023   K 3.7 04/04/2023   CL 98 04/04/2023   CREATININE 1.23 (H) 04/04/2023   BUN 20 04/04/2023   CO2 26  04/04/2023   TSH 6.657 (H) 04/02/2023   INR 1.3 (H) 04/03/2023   HGBA1C 5.8 (H) 11/02/2020    CT CHEST WO CONTRAST Result Date: 04/03/2023 CLINICAL DATA:  Chronic dyspnea with unclear etiology EXAM: CT CHEST WITHOUT CONTRAST TECHNIQUE: Multidetector CT imaging of the chest was performed following the standard protocol without IV contrast. RADIATION DOSE REDUCTION: This exam was performed according to the departmental dose-optimization program which includes automated exposure control, adjustment of the mA and/or kV according to patient size and/or use of iterative reconstruction technique. COMPARISON:  Radiograph from yesterday FINDINGS: Cardiovascular: Cardiac enlargement. Small low-density pericardial effusion measuring up to 9 mm in thickness posteriorly. There is atheromatous calcification. Prominent mitral annular calcification. Lead less pacer at the right ventricular apex. Mediastinum/Nodes: No worrisome lymph node enlargement, mass, or esophageal thickening. Lungs/Pleura: Biapical pleural based scarring. Central airways are clear. Minor dependent atelectasis. There is no consolidation, effusion, or pneumothorax. Slight interlobular septal thickening best seen on coronal reformats. Upper Abdomen: Cholecystectomy clips and atheromatous calcification. Musculoskeletal: T11 compression fracture with moderate height loss, nonacute and known from 2023 MRI. IMPRESSION: 1. Slight thickening of the interstitium may be minimal pulmonary edema. There is chronic cardiomegaly with small pericardial effusion. 2. Negative for pneumonia. Electronically Signed   By: Tiburcio Pea M.D.   On: 04/03/2023 07:28    Assessment & Plan:  .There are no diagnoses linked to this encounter.   I spent 34 minutes on the day of this face to face encounter reviewing patient's  most recent visit with cardiology,  nephrology,  and neurology,  prior relevant surgical and non surgical procedures, recent  labs and imaging studies,  counseling on weight management,  reviewing the assessment and plan with patient, and post visit ordering and reviewing of  diagnostics and therapeutics with patient  .   Follow-up: No follow-ups on file.   Sherlene Shams, MD

## 2023-04-12 NOTE — Patient Instructions (Addendum)
 Suspend the lisinopril;  that's causing her low blood pressure   Continue metoprolol 25 mg twice daily  for the heart rate   Frances Kent look dehydrated.  This can cause nausea .  Please Reduce torsemide to 10 mg daily until you can change her back to furosemide 20 mg daily   Return for labs when able   Palliative care referral made

## 2023-04-13 DIAGNOSIS — R627 Adult failure to thrive: Secondary | ICD-10-CM | POA: Insufficient documentation

## 2023-04-13 NOTE — Assessment & Plan Note (Addendum)
 Secondary to overdiuresis, lisinopril and excesive dose of metoprolol.  Dc lisinopril, agree with reductioin n metoprolol dose to 25 mg bid

## 2023-04-13 NOTE — Assessment & Plan Note (Signed)
 She has been miserable for the past year due to her cardiomyopathy resultig in numerous hospitalizations.  Palliative care consult discussed with patient and duaghter and put in process

## 2023-04-13 NOTE — Assessment & Plan Note (Addendum)
 Based on her appearance and her drop in GFR comparatively I suspect that the weight loss is due in part to dehydration d. Given her cardiomyopathy and frequent hospitalizations she will require some form of diuretic but will change to furosemide 20 mg daily  and repeat CR in 2 days . Marland Kitchen

## 2023-04-14 ENCOUNTER — Other Ambulatory Visit (INDEPENDENT_AMBULATORY_CARE_PROVIDER_SITE_OTHER): Payer: Medicare HMO

## 2023-04-14 DIAGNOSIS — E039 Hypothyroidism, unspecified: Secondary | ICD-10-CM | POA: Diagnosis not present

## 2023-04-14 DIAGNOSIS — I10 Essential (primary) hypertension: Secondary | ICD-10-CM | POA: Diagnosis not present

## 2023-04-14 DIAGNOSIS — I5022 Chronic systolic (congestive) heart failure: Secondary | ICD-10-CM | POA: Diagnosis not present

## 2023-04-14 NOTE — Addendum Note (Signed)
 Addended by: Warden Fillers on: 04/14/2023 02:21 PM   Modules accepted: Orders

## 2023-04-15 LAB — BASIC METABOLIC PANEL
BUN/Creatinine Ratio: 16 (ref 12–28)
BUN: 28 mg/dL (ref 10–36)
CO2: 22 mmol/L (ref 20–29)
Calcium: 9.6 mg/dL (ref 8.7–10.3)
Chloride: 100 mmol/L (ref 96–106)
Creatinine, Ser: 1.78 mg/dL — ABNORMAL HIGH (ref 0.57–1.00)
Glucose: 99 mg/dL (ref 70–99)
Potassium: 4.6 mmol/L (ref 3.5–5.2)
Sodium: 142 mmol/L (ref 134–144)
eGFR: 27 mL/min/{1.73_m2} — ABNORMAL LOW (ref >59)

## 2023-04-15 LAB — TSH: TSH: 1.92 u[IU]/mL (ref 0.450–4.500)

## 2023-04-15 LAB — BRAIN NATRIURETIC PEPTIDE: BNP: 351.5 pg/mL — ABNORMAL HIGH (ref 0.0–100.0)

## 2023-04-16 ENCOUNTER — Encounter: Payer: Self-pay | Admitting: Internal Medicine

## 2023-04-18 ENCOUNTER — Telehealth: Payer: Self-pay

## 2023-04-18 NOTE — Telephone Encounter (Signed)
 Copied from CRM [id number]. Topic: Clinical - Request for Lab/Test Order >> Apr 18, 2023  1:08 PM Florestine Avers wrote: Reason for CRM: Patients daughter [name] is calling on behalf of the patient, she states that Dr. Darrick Huntsman was supposed to contact Palliative care, and Ms. Phylis wanted to know if that was done. She is asking for a call back with that information [phone].

## 2023-04-19 ENCOUNTER — Other Ambulatory Visit: Payer: Self-pay

## 2023-04-19 ENCOUNTER — Inpatient Hospital Stay (HOSPITAL_COMMUNITY)
Admission: EM | Admit: 2023-04-19 | Discharge: 2023-04-21 | DRG: 023 | Disposition: A | Discharge status: Rehabilitation Facility | Attending: Neurology | Admitting: Neurology

## 2023-04-19 ENCOUNTER — Other Ambulatory Visit: Payer: Self-pay | Admitting: Internal Medicine

## 2023-04-19 ENCOUNTER — Emergency Department (HOSPITAL_COMMUNITY)

## 2023-04-19 ENCOUNTER — Inpatient Hospital Stay (HOSPITAL_COMMUNITY)

## 2023-04-19 ENCOUNTER — Encounter (HOSPITAL_COMMUNITY): Payer: Self-pay | Admitting: *Deleted

## 2023-04-19 ENCOUNTER — Encounter (HOSPITAL_COMMUNITY)
Admission: EM | Disposition: A | Discharge status: Rehabilitation Facility | Payer: Self-pay | Source: Home / Self Care | Attending: Neurology

## 2023-04-19 ENCOUNTER — Emergency Department (HOSPITAL_COMMUNITY): Admitting: Critical Care Medicine

## 2023-04-19 DIAGNOSIS — I5042 Chronic combined systolic (congestive) and diastolic (congestive) heart failure: Secondary | ICD-10-CM | POA: Diagnosis present

## 2023-04-19 DIAGNOSIS — R29810 Facial weakness: Secondary | ICD-10-CM | POA: Diagnosis present

## 2023-04-19 DIAGNOSIS — Z978 Presence of other specified devices: Secondary | ICD-10-CM | POA: Diagnosis present

## 2023-04-19 DIAGNOSIS — N1831 Chronic kidney disease, stage 3a: Secondary | ICD-10-CM | POA: Diagnosis not present

## 2023-04-19 DIAGNOSIS — L89152 Pressure ulcer of sacral region, stage 2: Secondary | ICD-10-CM | POA: Diagnosis present

## 2023-04-19 DIAGNOSIS — F418 Other specified anxiety disorders: Secondary | ICD-10-CM

## 2023-04-19 DIAGNOSIS — I340 Nonrheumatic mitral (valve) insufficiency: Secondary | ICD-10-CM | POA: Diagnosis present

## 2023-04-19 DIAGNOSIS — I429 Cardiomyopathy, unspecified: Secondary | ICD-10-CM | POA: Diagnosis present

## 2023-04-19 DIAGNOSIS — I639 Cerebral infarction, unspecified: Secondary | ICD-10-CM | POA: Diagnosis present

## 2023-04-19 DIAGNOSIS — Z4682 Encounter for fitting and adjustment of non-vascular catheter: Secondary | ICD-10-CM | POA: Diagnosis not present

## 2023-04-19 DIAGNOSIS — I69354 Hemiplegia and hemiparesis following cerebral infarction affecting left non-dominant side: Secondary | ICD-10-CM | POA: Diagnosis not present

## 2023-04-19 DIAGNOSIS — G8194 Hemiplegia, unspecified affecting left nondominant side: Secondary | ICD-10-CM | POA: Diagnosis not present

## 2023-04-19 DIAGNOSIS — I6601 Occlusion and stenosis of right middle cerebral artery: Secondary | ICD-10-CM | POA: Diagnosis present

## 2023-04-19 DIAGNOSIS — Z79899 Other long term (current) drug therapy: Secondary | ICD-10-CM

## 2023-04-19 DIAGNOSIS — I63311 Cerebral infarction due to thrombosis of right middle cerebral artery: Secondary | ICD-10-CM | POA: Diagnosis not present

## 2023-04-19 DIAGNOSIS — R627 Adult failure to thrive: Secondary | ICD-10-CM | POA: Diagnosis present

## 2023-04-19 DIAGNOSIS — J9811 Atelectasis: Secondary | ICD-10-CM | POA: Diagnosis not present

## 2023-04-19 DIAGNOSIS — I5032 Chronic diastolic (congestive) heart failure: Secondary | ICD-10-CM | POA: Diagnosis not present

## 2023-04-19 DIAGNOSIS — N1832 Chronic kidney disease, stage 3b: Secondary | ICD-10-CM | POA: Diagnosis present

## 2023-04-19 DIAGNOSIS — Z86711 Personal history of pulmonary embolism: Secondary | ICD-10-CM

## 2023-04-19 DIAGNOSIS — I110 Hypertensive heart disease with heart failure: Secondary | ICD-10-CM

## 2023-04-19 DIAGNOSIS — Z8673 Personal history of transient ischemic attack (TIA), and cerebral infarction without residual deficits: Secondary | ICD-10-CM | POA: Diagnosis not present

## 2023-04-19 DIAGNOSIS — Z888 Allergy status to other drugs, medicaments and biological substances status: Secondary | ICD-10-CM

## 2023-04-19 DIAGNOSIS — F5104 Psychophysiologic insomnia: Secondary | ICD-10-CM

## 2023-04-19 DIAGNOSIS — J9601 Acute respiratory failure with hypoxia: Secondary | ICD-10-CM | POA: Diagnosis not present

## 2023-04-19 DIAGNOSIS — Z800 Family history of malignant neoplasm of digestive organs: Secondary | ICD-10-CM

## 2023-04-19 DIAGNOSIS — H3530 Unspecified macular degeneration: Secondary | ICD-10-CM | POA: Diagnosis present

## 2023-04-19 DIAGNOSIS — E440 Moderate protein-calorie malnutrition: Secondary | ICD-10-CM | POA: Diagnosis not present

## 2023-04-19 DIAGNOSIS — I63521 Cerebral infarction due to unspecified occlusion or stenosis of right anterior cerebral artery: Secondary | ICD-10-CM | POA: Diagnosis not present

## 2023-04-19 DIAGNOSIS — N179 Acute kidney failure, unspecified: Secondary | ICD-10-CM | POA: Diagnosis present

## 2023-04-19 DIAGNOSIS — R1310 Dysphagia, unspecified: Secondary | ICD-10-CM | POA: Diagnosis present

## 2023-04-19 DIAGNOSIS — E039 Hypothyroidism, unspecified: Secondary | ICD-10-CM | POA: Diagnosis present

## 2023-04-19 DIAGNOSIS — I4811 Longstanding persistent atrial fibrillation: Secondary | ICD-10-CM | POA: Diagnosis not present

## 2023-04-19 DIAGNOSIS — R471 Dysarthria and anarthria: Secondary | ICD-10-CM | POA: Diagnosis present

## 2023-04-19 DIAGNOSIS — E785 Hyperlipidemia, unspecified: Secondary | ICD-10-CM | POA: Diagnosis present

## 2023-04-19 DIAGNOSIS — Z8616 Personal history of COVID-19: Secondary | ICD-10-CM

## 2023-04-19 DIAGNOSIS — Z803 Family history of malignant neoplasm of breast: Secondary | ICD-10-CM

## 2023-04-19 DIAGNOSIS — I10 Essential (primary) hypertension: Secondary | ICD-10-CM | POA: Diagnosis not present

## 2023-04-19 DIAGNOSIS — I130 Hypertensive heart and chronic kidney disease with heart failure and stage 1 through stage 4 chronic kidney disease, or unspecified chronic kidney disease: Secondary | ICD-10-CM | POA: Diagnosis present

## 2023-04-19 DIAGNOSIS — I4891 Unspecified atrial fibrillation: Secondary | ICD-10-CM | POA: Diagnosis not present

## 2023-04-19 DIAGNOSIS — R531 Weakness: Secondary | ICD-10-CM | POA: Diagnosis not present

## 2023-04-19 DIAGNOSIS — R930 Abnormal findings on diagnostic imaging of skull and head, not elsewhere classified: Secondary | ICD-10-CM | POA: Diagnosis not present

## 2023-04-19 DIAGNOSIS — I471 Supraventricular tachycardia, unspecified: Secondary | ICD-10-CM | POA: Diagnosis present

## 2023-04-19 DIAGNOSIS — R918 Other nonspecific abnormal finding of lung field: Secondary | ICD-10-CM | POA: Diagnosis not present

## 2023-04-19 DIAGNOSIS — R0689 Other abnormalities of breathing: Secondary | ICD-10-CM | POA: Diagnosis not present

## 2023-04-19 DIAGNOSIS — Z8249 Family history of ischemic heart disease and other diseases of the circulatory system: Secondary | ICD-10-CM

## 2023-04-19 DIAGNOSIS — I5023 Acute on chronic systolic (congestive) heart failure: Secondary | ICD-10-CM | POA: Diagnosis not present

## 2023-04-19 DIAGNOSIS — Z66 Do not resuscitate: Secondary | ICD-10-CM | POA: Diagnosis present

## 2023-04-19 DIAGNOSIS — Z79890 Hormone replacement therapy (postmenopausal): Secondary | ICD-10-CM

## 2023-04-19 DIAGNOSIS — K219 Gastro-esophageal reflux disease without esophagitis: Secondary | ICD-10-CM | POA: Diagnosis present

## 2023-04-19 DIAGNOSIS — I6389 Other cerebral infarction: Secondary | ICD-10-CM | POA: Diagnosis not present

## 2023-04-19 DIAGNOSIS — I63511 Cerebral infarction due to unspecified occlusion or stenosis of right middle cerebral artery: Secondary | ICD-10-CM | POA: Diagnosis not present

## 2023-04-19 DIAGNOSIS — Z681 Body mass index (BMI) 19 or less, adult: Secondary | ICD-10-CM

## 2023-04-19 DIAGNOSIS — R404 Transient alteration of awareness: Secondary | ICD-10-CM | POA: Diagnosis not present

## 2023-04-19 DIAGNOSIS — Z7901 Long term (current) use of anticoagulants: Secondary | ICD-10-CM

## 2023-04-19 DIAGNOSIS — Z823 Family history of stroke: Secondary | ICD-10-CM

## 2023-04-19 DIAGNOSIS — R29713 NIHSS score 13: Secondary | ICD-10-CM | POA: Diagnosis present

## 2023-04-19 DIAGNOSIS — G319 Degenerative disease of nervous system, unspecified: Secondary | ICD-10-CM | POA: Diagnosis not present

## 2023-04-19 DIAGNOSIS — I517 Cardiomegaly: Secondary | ICD-10-CM | POA: Diagnosis not present

## 2023-04-19 DIAGNOSIS — R29818 Other symptoms and signs involving the nervous system: Secondary | ICD-10-CM | POA: Diagnosis not present

## 2023-04-19 DIAGNOSIS — H518 Other specified disorders of binocular movement: Secondary | ICD-10-CM | POA: Diagnosis present

## 2023-04-19 DIAGNOSIS — I69351 Hemiplegia and hemiparesis following cerebral infarction affecting right dominant side: Secondary | ICD-10-CM | POA: Diagnosis not present

## 2023-04-19 DIAGNOSIS — Z950 Presence of cardiac pacemaker: Secondary | ICD-10-CM

## 2023-04-19 DIAGNOSIS — I63411 Cerebral infarction due to embolism of right middle cerebral artery: Secondary | ICD-10-CM | POA: Diagnosis not present

## 2023-04-19 HISTORY — PX: IR CT HEAD LTD: IMG2386

## 2023-04-19 HISTORY — PX: IR PERCUTANEOUS ART THROMBECTOMY/INFUSION INTRACRANIAL INC DIAG ANGIO: IMG6087

## 2023-04-19 HISTORY — PX: RADIOLOGY WITH ANESTHESIA: SHX6223

## 2023-04-19 LAB — URINALYSIS, ROUTINE W REFLEX MICROSCOPIC
Bacteria, UA: NONE SEEN
Bilirubin Urine: NEGATIVE
Glucose, UA: NEGATIVE mg/dL
Ketones, ur: NEGATIVE mg/dL
Nitrite: NEGATIVE
Protein, ur: NEGATIVE mg/dL
Specific Gravity, Urine: >1.046 — ABNORMAL HIGH (ref 1.005–1.030)
pH: 5 (ref 5.0–8.0)

## 2023-04-19 LAB — CBC
HCT: 42.2 % (ref 36.0–46.0)
Hemoglobin: 13.5 g/dL (ref 12.0–15.0)
MCH: 29 pg (ref 26.0–34.0)
MCHC: 32 g/dL (ref 30.0–36.0)
MCV: 90.8 fL (ref 80.0–100.0)
Platelets: 210 10*3/uL (ref 150–400)
RBC: 4.65 MIL/uL (ref 3.87–5.11)
RDW: 16.3 % — ABNORMAL HIGH (ref 11.5–15.5)
WBC: 6.1 10*3/uL (ref 4.0–10.5)
nRBC: 0 % (ref 0.0–0.2)

## 2023-04-19 LAB — LIPID PANEL
Cholesterol: 239 mg/dL — ABNORMAL HIGH (ref 0–200)
HDL: 82 mg/dL (ref >40)
LDL Cholesterol: 143 mg/dL — ABNORMAL HIGH (ref 0–99)
Total CHOL/HDL Ratio: 2.9 ratio
Triglycerides: 69 mg/dL (ref <150)
VLDL: 14 mg/dL (ref 0–40)

## 2023-04-19 LAB — I-STAT CHEM 8, ED
BUN: 20 mg/dL (ref 8–23)
Calcium, Ion: 1.11 mmol/L — ABNORMAL LOW (ref 1.15–1.40)
Chloride: 105 mmol/L (ref 98–111)
Creatinine, Ser: 1.4 mg/dL — ABNORMAL HIGH (ref 0.44–1.00)
Glucose, Bld: 131 mg/dL — ABNORMAL HIGH (ref 70–99)
HCT: 43 % (ref 36.0–46.0)
Hemoglobin: 14.6 g/dL (ref 12.0–15.0)
Potassium: 3.7 mmol/L (ref 3.5–5.1)
Sodium: 139 mmol/L (ref 135–145)
TCO2: 25 mmol/L (ref 22–32)

## 2023-04-19 LAB — COMPREHENSIVE METABOLIC PANEL
ALT: 15 U/L (ref 0–44)
AST: 26 U/L (ref 15–41)
Albumin: 3.6 g/dL (ref 3.5–5.0)
Alkaline Phosphatase: 57 U/L (ref 38–126)
Anion gap: 12 (ref 5–15)
BUN: 18 mg/dL (ref 8–23)
CO2: 23 mmol/L (ref 22–32)
Calcium: 9.3 mg/dL (ref 8.9–10.3)
Chloride: 103 mmol/L (ref 98–111)
Creatinine, Ser: 1.33 mg/dL — ABNORMAL HIGH (ref 0.44–1.00)
GFR, Estimated: 38 mL/min — ABNORMAL LOW (ref >60)
Glucose, Bld: 133 mg/dL — ABNORMAL HIGH (ref 70–99)
Potassium: 3.8 mmol/L (ref 3.5–5.1)
Sodium: 138 mmol/L (ref 135–145)
Total Bilirubin: 0.5 mg/dL (ref 0.0–1.2)
Total Protein: 6.7 g/dL (ref 6.5–8.1)

## 2023-04-19 LAB — POCT I-STAT 7, (LYTES, BLD GAS, ICA,H+H)
Acid-Base Excess: 1 mmol/L (ref 0.0–2.0)
Bicarbonate: 23.1 mmol/L (ref 20.0–28.0)
Calcium, Ion: 1.15 mmol/L (ref 1.15–1.40)
HCT: 35 % — ABNORMAL LOW (ref 36.0–46.0)
Hemoglobin: 11.9 g/dL — ABNORMAL LOW (ref 12.0–15.0)
O2 Saturation: 100 %
Potassium: 3.5 mmol/L (ref 3.5–5.1)
Sodium: 137 mmol/L (ref 135–145)
TCO2: 24 mmol/L (ref 22–32)
pCO2 arterial: 29.3 mmHg — ABNORMAL LOW (ref 32–48)
pH, Arterial: 7.505 — ABNORMAL HIGH (ref 7.35–7.45)
pO2, Arterial: 268 mmHg — ABNORMAL HIGH (ref 83–108)

## 2023-04-19 LAB — RAPID URINE DRUG SCREEN, HOSP PERFORMED
Amphetamines: NOT DETECTED
Barbiturates: NOT DETECTED
Benzodiazepines: NOT DETECTED
Cocaine: NOT DETECTED
Opiates: NOT DETECTED
Tetrahydrocannabinol: NOT DETECTED

## 2023-04-19 LAB — RESP PANEL BY RT-PCR (RSV, FLU A&B, COVID)  RVPGX2
Influenza A by PCR: NEGATIVE
Influenza B by PCR: NEGATIVE
Resp Syncytial Virus by PCR: NEGATIVE
SARS Coronavirus 2 by RT PCR: NEGATIVE

## 2023-04-19 LAB — ETHANOL: Alcohol, Ethyl (B): <10 mg/dL (ref <10)

## 2023-04-19 LAB — PROTIME-INR
INR: 4.4 (ref 0.8–1.2)
INR: 4.9 (ref 0.8–1.2)
Prothrombin Time: 42.1 s — ABNORMAL HIGH (ref 11.4–15.2)
Prothrombin Time: 46.1 s — ABNORMAL HIGH (ref 11.4–15.2)

## 2023-04-19 LAB — DIFFERENTIAL
Abs Immature Granulocytes: 0.02 10*3/uL (ref 0.00–0.07)
Basophils Absolute: 0.1 10*3/uL (ref 0.0–0.1)
Basophils Relative: 2 %
Eosinophils Absolute: 0.1 10*3/uL (ref 0.0–0.5)
Eosinophils Relative: 2 %
Immature Granulocytes: 0 %
Lymphocytes Relative: 37 %
Lymphs Abs: 2.2 10*3/uL (ref 0.7–4.0)
Monocytes Absolute: 0.6 10*3/uL (ref 0.1–1.0)
Monocytes Relative: 10 %
Neutro Abs: 3 10*3/uL (ref 1.7–7.7)
Neutrophils Relative %: 49 %

## 2023-04-19 LAB — MRSA NEXT GEN BY PCR, NASAL: MRSA by PCR Next Gen: NOT DETECTED

## 2023-04-19 LAB — HEMOGLOBIN A1C
Hgb A1c MFr Bld: 6.2 % — ABNORMAL HIGH (ref 4.8–5.6)
Mean Plasma Glucose: 131.24 mg/dL

## 2023-04-19 LAB — APTT: aPTT: 48 s — ABNORMAL HIGH (ref 24–36)

## 2023-04-19 SURGERY — RADIOLOGY WITH ANESTHESIA
Anesthesia: General

## 2023-04-19 MED ORDER — LACTATED RINGERS IV SOLN: INTRAVENOUS | Status: DC | PRN

## 2023-04-19 MED ORDER — ACETAMINOPHEN 325 MG PO TABS: 650.0000 mg | ORAL_TABLET | ORAL | Status: DC | PRN

## 2023-04-19 MED ORDER — IOHEXOL 300 MG/ML  SOLN
100.0000 mL | Freq: Once | INTRAMUSCULAR | Status: AC | PRN
  Administered 2023-04-19: 5 mL via INTRA_ARTERIAL

## 2023-04-19 MED ORDER — ORAL CARE MOUTH RINSE: 15.0000 mL | OROMUCOSAL | Status: DC | PRN

## 2023-04-19 MED ORDER — PANTOPRAZOLE SODIUM 40 MG IV SOLR
40.0000 mg | INTRAVENOUS | Status: DC
  Administered 2023-04-19 – 2023-04-20 (×2): 40 mg via INTRAVENOUS
  Filled 2023-04-19 (×2): qty 10

## 2023-04-19 MED ORDER — CLEVIDIPINE BUTYRATE 0.5 MG/ML IV EMUL
0.0000 mg/h | INTRAVENOUS | Status: DC
  Administered 2023-04-19: 2 mg/h via INTRAVENOUS
  Filled 2023-04-19: qty 100

## 2023-04-19 MED ORDER — IOHEXOL 300 MG/ML  SOLN
150.0000 mL | Freq: Once | INTRAMUSCULAR | Status: AC | PRN
  Administered 2023-04-19: 75 mL via INTRA_ARTERIAL

## 2023-04-19 MED ORDER — FENTANYL CITRATE PF 50 MCG/ML IJ SOSY: 25.0000 ug | PREFILLED_SYRINGE | INTRAMUSCULAR | Status: DC | PRN

## 2023-04-19 MED ORDER — NITROGLYCERIN 1 MG/10 ML FOR IR/CATH LAB
INTRA_ARTERIAL | Status: AC | PRN
  Administered 2023-04-19: 25 ug via INTRA_ARTERIAL

## 2023-04-19 MED ORDER — POTASSIUM CHLORIDE 20 MEQ PO PACK
40.0000 meq | PACK | Freq: Once | ORAL | Status: AC
  Administered 2023-04-19: 40 meq
  Filled 2023-04-19: qty 2

## 2023-04-19 MED ORDER — IOHEXOL 350 MG/ML SOLN
75.0000 mL | Freq: Once | INTRAVENOUS | Status: AC | PRN
  Administered 2023-04-19: 75 mL via INTRAVENOUS

## 2023-04-19 MED ORDER — SODIUM CHLORIDE 0.9 % IV SOLN: INTRAVENOUS | Status: AC

## 2023-04-19 MED ORDER — ACETAMINOPHEN 160 MG/5ML PO SOLN: 650.0000 mg | ORAL | Status: DC | PRN

## 2023-04-19 MED ORDER — ACETAMINOPHEN 325 MG PO TABS
650.0000 mg | ORAL_TABLET | ORAL | Status: DC | PRN
  Administered 2023-04-20: 650 mg via ORAL
  Filled 2023-04-19: qty 2

## 2023-04-19 MED ORDER — SODIUM CHLORIDE 0.9 % IV SOLN: INTRAVENOUS | Status: DC

## 2023-04-19 MED ORDER — ROCURONIUM BROMIDE 10 MG/ML (PF) SYRINGE
PREFILLED_SYRINGE | INTRAVENOUS | Status: DC | PRN
  Administered 2023-04-19: 10 mg via INTRAVENOUS
  Administered 2023-04-19: 40 mg via INTRAVENOUS

## 2023-04-19 MED ORDER — ACETAMINOPHEN 650 MG RE SUPP
650.0000 mg | RECTAL | Status: DC | PRN
Start: 2023-04-19 — End: 2023-04-19

## 2023-04-19 MED ORDER — DOCUSATE SODIUM 50 MG/5ML PO LIQD
100.0000 mg | Freq: Two times a day (BID) | ORAL | Status: DC
  Administered 2023-04-19: 100 mg
  Filled 2023-04-19 (×2): qty 10

## 2023-04-19 MED ORDER — EPHEDRINE SULFATE-NACL 50-0.9 MG/10ML-% IV SOSY
PREFILLED_SYRINGE | INTRAVENOUS | Status: DC | PRN
Start: 2023-04-19 — End: 2023-04-19
  Administered 2023-04-19 (×2): 2.5 mg via INTRAVENOUS

## 2023-04-19 MED ORDER — ASPIRIN 300 MG RE SUPP
300.0000 mg | Freq: Every day | RECTAL | Status: DC
  Administered 2023-04-19 – 2023-04-20 (×2): 300 mg via RECTAL
  Filled 2023-04-19 (×2): qty 1

## 2023-04-19 MED ORDER — PROPOFOL 500 MG/50ML IV EMUL
INTRAVENOUS | Status: DC | PRN
  Administered 2023-04-19: 50 ug/kg/min via INTRAVENOUS

## 2023-04-19 MED ORDER — PROPOFOL 1000 MG/100ML IV EMUL
0.0000 ug/kg/min | INTRAVENOUS | Status: DC
  Administered 2023-04-19: 10 ug/kg/min via INTRAVENOUS
  Filled 2023-04-19: qty 100

## 2023-04-19 MED ORDER — ALBUTEROL SULFATE (2.5 MG/3ML) 0.083% IN NEBU: 2.5000 mg | INHALATION_SOLUTION | RESPIRATORY_TRACT | Status: DC | PRN

## 2023-04-19 MED ORDER — POLYETHYLENE GLYCOL 3350 17 G PO PACK
17.0000 g | PACK | Freq: Every day | ORAL | Status: DC
Start: 2023-04-19 — End: 2023-04-20
  Administered 2023-04-19: 17 g
  Filled 2023-04-19: qty 1

## 2023-04-19 MED ORDER — ENOXAPARIN SODIUM 30 MG/0.3ML IJ SOSY
30.0000 mg | PREFILLED_SYRINGE | INTRAMUSCULAR | Status: DC
  Administered 2023-04-20: 30 mg via SUBCUTANEOUS
  Filled 2023-04-19: qty 0.3

## 2023-04-19 MED ORDER — ACETAMINOPHEN 650 MG RE SUPP: 650.0000 mg | RECTAL | Status: DC | PRN

## 2023-04-19 MED ORDER — SENNOSIDES-DOCUSATE SODIUM 8.6-50 MG PO TABS: 1.0000 | ORAL_TABLET | Freq: Every evening | ORAL | Status: DC | PRN

## 2023-04-19 MED ORDER — ORAL CARE MOUTH RINSE
15.0000 mL | OROMUCOSAL | Status: DC
  Administered 2023-04-19 (×2): 15 mL via OROMUCOSAL

## 2023-04-19 MED ORDER — ONDANSETRON HCL 4 MG/2ML IJ SOLN
INTRAMUSCULAR | Status: DC | PRN
  Administered 2023-04-19: 4 mg via INTRAVENOUS

## 2023-04-19 MED ORDER — DEXAMETHASONE SODIUM PHOSPHATE 10 MG/ML IJ SOLN
INTRAMUSCULAR | Status: DC | PRN
  Administered 2023-04-19: 4 mg via INTRAVENOUS

## 2023-04-19 MED ORDER — LIDOCAINE 2% (20 MG/ML) 5 ML SYRINGE
INTRAMUSCULAR | Status: DC | PRN
  Administered 2023-04-19: 60 mg via INTRAVENOUS

## 2023-04-19 MED ORDER — LEVOTHYROXINE SODIUM 50 MCG PO TABS
50.0000 ug | ORAL_TABLET | Freq: Every day | ORAL | Status: DC
  Administered 2023-04-21: 50 ug via ORAL
  Filled 2023-04-19: qty 1

## 2023-04-19 MED ORDER — CHLORHEXIDINE GLUCONATE CLOTH 2 % EX PADS
6.0000 | MEDICATED_PAD | Freq: Every day | CUTANEOUS | Status: DC
  Administered 2023-04-20 – 2023-04-21 (×2): 6 via TOPICAL

## 2023-04-19 MED ORDER — NITROGLYCERIN 1 MG/10 ML FOR IR/CATH LAB
INTRA_ARTERIAL | Status: AC
  Filled 2023-04-19: qty 10

## 2023-04-19 MED ORDER — STROKE: EARLY STAGES OF RECOVERY BOOK
Freq: Once | Status: AC
  Filled 2023-04-19: qty 1

## 2023-04-19 MED ORDER — PROPOFOL 10 MG/ML IV BOLUS
INTRAVENOUS | Status: DC | PRN
  Administered 2023-04-19: 50 mg via INTRAVENOUS

## 2023-04-19 MED ORDER — FAMOTIDINE 20 MG PO TABS: 20.0000 mg | ORAL_TABLET | Freq: Two times a day (BID) | ORAL | Status: DC

## 2023-04-19 MED ORDER — SUCCINYLCHOLINE CHLORIDE 200 MG/10ML IV SOSY
PREFILLED_SYRINGE | INTRAVENOUS | Status: DC | PRN
  Administered 2023-04-19: 80 mg via INTRAVENOUS

## 2023-04-19 MED ORDER — PHENYLEPHRINE HCL-NACL 20-0.9 MG/250ML-% IV SOLN
INTRAVENOUS | Status: DC | PRN
  Administered 2023-04-19: 25 ug/min via INTRAVENOUS

## 2023-04-19 NOTE — Evaluation (Signed)
 Clinical/Bedside Swallow Evaluation Patient Details  Name: Frances Kent MRN: [id number] Date of Birth: [birth year]  Today's Date: 04/19/2023 Time: SLP Start Time (ACUTE ONLY): 1620 SLP Stop Time (ACUTE ONLY): 1635 SLP Time Calculation (min) (ACUTE ONLY): 15 min  Past Medical History:  Past Medical History:  Diagnosis Date   Arthritis    Atrial fibrillation (HCC)    Benign breast cyst in female, left 10/07/2016   Candida esophagitis (HCC) 05/03/2021   Found on Mar 10 2021 EGD    CAP (community acquired pneumonia) 02/15/2021   CHF (congestive heart failure) (HCC)    Colon adenomas    GERD (gastroesophageal reflux disease)    HCAP (healthcare-associated pneumonia) 02/02/2021   Hypertension    Hypothyroidism    Macular degeneration    Mitral regurgitation    Pulmonary embolism (HCC) 02/2016   Severe sepsis (HCC) 02/02/2021   Stroke (HCC) 10/2020   SVT (supraventricular tachycardia) (HCC)    Past Surgical History:  Past Surgical History:  Procedure Laterality Date   APPENDECTOMY  1960   BREAST CYST ASPIRATION Left 2017   CARDIOVERSION N/A 04/05/2016   Procedure: Cardioversion;  Surgeon: Lamar Blinks, MD;  Location: [facility] OR[facility];  Service: Cardiovascular;  Laterality: N/A;   CHOLECYSTECTOMY  1985   ESOPHAGOGASTRODUODENOSCOPY N/A 03/10/2021   Procedure: ESOPHAGOGASTRODUODENOSCOPY (EGD);  Surgeon: Toledo, Boykin Nearing, MD;  Location: [facility];  Service: Gastroenterology;  Laterality: N/A;   ESOPHAGOGASTRODUODENOSCOPY (EGD) WITH PROPOFOL N/A 11/27/2019   Procedure: ESOPHAGOGASTRODUODENOSCOPY (EGD) WITH PROPOFOL;  Surgeon: Regis Bill, MD;  Location: [facility];  Service: Endoscopy;  Laterality: N/A;   ESOPHAGOGASTRODUODENOSCOPY (EGD) WITH PROPOFOL N/A 12/24/2019   Procedure: ESOPHAGOGASTRODUODENOSCOPY (EGD) WITH PROPOFOL;  Surgeon: Regis Bill, MD;  Location: [facility];  Service: Endoscopy;  Laterality: N/A;   IR CT HEAD LTD  11/01/2020   IR  PERCUTANEOUS ART THROMBECTOMY/INFUSION INTRACRANIAL INC DIAG ANGIO  11/01/2020   IR US GUIDE VASC ACCESS RIGHT  11/02/2020   OVARIAN CYST REMOVAL     PACEMAKER LEADLESS INSERTION N/A 11/16/2021   Procedure: PACEMAKER LEADLESS INSERTION;  Surgeon: Marcina Millard, MD;  Location: [facility];  Service: Cardiovascular;  Laterality: N/A;   RADIOLOGY WITH ANESTHESIA N/A 11/01/2020   Procedure: RADIOLOGY WITH ANESTHESIA;  Surgeon: Julieanne Cotton, MD;  Location: [facility];  Service: Radiology;  Laterality: N/A;   TEE WITHOUT CARDIOVERSION N/A 02/01/2018   Procedure: TRANSESOPHAGEAL ECHOCARDIOGRAM (TEE);  Surgeon: Lamar Blinks, MD;  Location: [facility] OR[facility];  Service: Cardiovascular;  Laterality: N/A;   HPI:  Frances Kent is a 89 yo female presenting to ED 3/5 with acute onset of aphasia and L sided weakness. Pt previously seen by SLP in [facility] for cognitive impairments (problem solving, divergent naming, memory retrieval) s/p L basal ganglia CVA 2022. PMH includes A-fib, pacemaker, arthritis, recent COVID infection, GERD, HFrEF, HTN, macular degeneration, hypothyroidism, mitral regurgitation, PE (2019), prior CVA without residual deficits, SVT    Assessment / Plan / Recommendation  Clinical Impression  Patient presenting with clinical s/s of dysphagia as per this bedside swallow evaluation. She indicated that at baseline, she avoids foods that are too tough, dry, etc. and cuts up food and medications and chews food thoroughly. She indicated that even when doing this, she can have sensation of food getting stuck in throat. She reports having to have food that was stuck in her esophagus "pushed down" (referring to EGD) but with most recent being 2023. Patient required assistance with managing cup of water secondary to  tremor. SLP assisted her and she took a very small sip of water. She held water in her mouth and performed total of six swallows before oral cavity was cleared. Swallows were  audible and appeared effortful. Patient indicated that this was not her baseline swallow abilities and daughter confirmed that. Patient was uneasy about trying applesauce due to her h/o dysphagia and current decline in swallow function. SLP recommending continue NPO, allow floor stock thin liquids and will proceed with MBS next date. SLP Visit Diagnosis: Dysphagia, unspecified (R13.10)    Aspiration Risk  Mild aspiration risk    Diet Recommendation NPO;Other (Comment) (PRN floor stock thins)    Medication Administration: Via alternative means Supervision: Staff to assist with self feeding Compensations: Small sips/bites Postural Changes: Seated upright at 90 degrees    Other  Recommendations Oral Care Recommendations: Oral care BID    Recommendations for follow up therapy are one component of a multi-disciplinary discharge planning process, led by the attending physician.  Recommendations may be updated based on patient status, additional functional criteria and insurance authorization.  Follow up Recommendations Other (comment) (TBD pending MBS)      Assistance Recommended at Discharge PRN  Functional Status Assessment Patient has had a recent decline in their functional status and demonstrates the ability to make significant improvements in function in a reasonable and predictable amount of time.  Frequency and Duration min 2x/week  1 week       Prognosis Prognosis for improved oropharyngeal function: Good      Swallow Study   General Date of Onset: 04/19/23 HPI: Frances Kent is a 89 yo female presenting to ED 3/5 with acute onset of aphasia and L sided weakness. Pt previously seen by SLP in [facility] for cognitive impairments (problem solving, divergent naming, memory retrieval) s/p L basal ganglia CVA 2022. PMH includes A-fib, pacemaker, arthritis, recent COVID infection, GERD, HFrEF, HTN, macular degeneration, hypothyroidism, mitral regurgitation, PE (2019), prior CVA without  residual deficits, SVT Type of Study: Bedside Swallow Evaluation Previous Swallow Assessment: during previous admission 11/2022 and following CVA 2022 Diet Prior to this Study: NPO Temperature Spikes Noted: No Respiratory Status: Room air History of Recent Intubation: Yes Total duration of intubation (days):  (for mechanical thrombectomy only) Date extubated: 04/19/23 Behavior/Cognition: Alert;Cooperative;Pleasant mood Oral Cavity Assessment: Within Functional Limits Oral Care Completed by SLP: No Oral Cavity - Dentition: Missing dentition;Edentulous Self-Feeding Abilities: Needs assist;Needs set up Patient Positioning: Upright in bed Baseline Vocal Quality: Normal;Low vocal intensity Volitional Cough: Weak Volitional Swallow: Able to elicit    Oral/Motor/Sensory Function Overall Oral Motor/Sensory Function: Mild impairment Facial ROM: Reduced right Facial Symmetry: Abnormal symmetry right Facial Strength: Reduced right Lingual ROM: Reduced right;Reduced left Lingual Symmetry: Within Functional Limits Lingual Strength: Reduced   Ice Chips     Thin Liquid Thin Liquid: Impaired Presentation: Cup Pharyngeal  Phase Impairments: Multiple swallows Other Comments: patient performed six swallows to clear oral cavity of a very small cup sip of water. Swallows were audible and appeared effortful    Nectar Thick     Honey Thick     Puree Puree: Not tested   Solid     Solid: Not tested     Angela Nevin, MA, CCC-SLP Speech Therapy

## 2023-04-19 NOTE — Procedures (Signed)
 Extubation Procedure Note  Patient Details:   Name: Frances Kent DOB: 07-23-1930 MRN: [id number]   Airway Documentation:    Vent end date: 04/19/23 Vent end time: 1115   Evaluation  O2 sats: stable throughout Complications: No apparent complications Patient did tolerate procedure well. Bilateral Breath Sounds: Clear, Diminished   Yes  Pt extubated to 2L [place] per MD order. Pt tolerated well, cuff leak present, no stridor noted, RN at bedside, RT will monitor as needed.   Thornell Mule 04/19/2023, 11:21 AM

## 2023-04-19 NOTE — ED Notes (Signed)
 Per daughter at bedside, pt has had a previous stroke with only deficit being that she drags her right foot. Aphasia noted to be worse with family at bedside

## 2023-04-19 NOTE — Progress Notes (Signed)
 [facility] [phone] [facility] Liaison note:  This patient is currently enrolled in [facility] outpatient-based palliative care.  Hospital Liaison will continue to follow for discharge disposition.  Please call for any outpatient based palliative care related questions or concerns. Thank you, Thea Gist, BSN, [facility] liaison [phone]

## 2023-04-19 NOTE — Progress Notes (Signed)
 eLink Physician-Brief Progress Note Patient Name: Frances Kent DOB: [birth year] MRN: [id number]   Date of Service  04/19/2023  HPI/Events of Note  59 F afib on Xarelto, s/p PPM,, HFrEF, HTN, hypothyroidsim, prior CVA presented with acute aphasia and left sided weakness. Deemed not a candidate for systemic thrombolysis due to Xarelto use but did undergo thrombectomy c/o IR with successful revascularization of occluded distal MCA.  Seen intubated in the ICU  eICU Interventions  Post thrombectomy care Neurochecks as per protocol Repeat imaging as per neurology     Intervention Category Evaluation Type: New Patient Evaluation  Darl Pikes 04/19/2023, 6:38 AM

## 2023-04-19 NOTE — Progress Notes (Signed)
 SLP Cancellation Note  Patient Details Name: Frances Kent MRN: [id number] DOB: Nov 26, 1930   Cancelled treatment:       Reason Eval/Treat Not Completed: Patient not medically ready. Pt remains intubated post R M1 revascularization IR procedure. SLP will follow up for assessment post extubation. Please add orders for swallow evaluation if needed.    Ellery Plunk 04/19/2023, 8:14 AM

## 2023-04-19 NOTE — Progress Notes (Signed)
 Chief Complaint: Patient was seen today for right M1 occlusion  Referring Physician(s):   Supervising Physician: Julieanne Cotton  Patient Status: [facility] - In-pt  Subjective: 3/5: Patient is post right common carotid arteriogram. Patient is currently intubated, but is able to follow commands. Her daughter is present at the bedside. History limited due to patient current clinical status.   Objective: Physical Exam: BP 135/78   Pulse (!) 58   Temp 97.7 F (36.5 C) (Oral)   Resp (!) 23   Ht 5\' 3"  (1.6 m)   Wt 104 lb 15 oz (47.6 kg)   SpO2 100%   BMI 18.59 kg/m  Patient is currently intubated, but is able to follow commands. PERRL bilaterally. EOMs intact bilaterally without nystagmus or subjective diplopia. No facial asymmetry. Motor power symmetric proportional to effort. No pronator drift. Grip strength intact bilaterally Common femoral artery puncture site on the right without bleeding, no hematoma, no pseudoaneurysm Gait not assessed. Romberg not assessed. Heel to toe not assessed. Distal pulses palpable   Current Facility-Administered Medications:    [START ON 04/20/2023]  stroke: early stages of recovery book, , Does not apply, Once, Caryl Pina, MD   0.9 %  sodium chloride infusion, , Intravenous, Continuous, Caryl Pina, MD, Last Rate: 40 mL/hr at 04/19/23 1000, Infusion Verify at 04/19/23 1000   acetaminophen (TYLENOL) tablet 650 mg, 650 mg, Oral, Q4H PRN **OR** acetaminophen (TYLENOL) 160 MG/5ML solution 650 mg, 650 mg, Per Tube, Q4H PRN **OR** acetaminophen (TYLENOL) suppository 650 mg, 650 mg, Rectal, Q4H PRN, Caryl Pina, MD   albuterol (PROVENTIL) (2.5 MG/3ML) 0.083% nebulizer solution 2.5 mg, 2.5 mg, Nebulization, Q4H PRN, Caryl Pina, MD   Chlorhexidine Gluconate Cloth 2 % PADS 6 each, 6 each, Topical, Daily, Caryl Pina, MD   clevidipine (CLEVIPREX) infusion 0.5 mg/mL, 0-21 mg/hr, Intravenous, Continuous, Deveshwar, Sanjeev, MD, Stopped at  04/19/23 0842   docusate (COLACE) 50 MG/5ML liquid 100 mg, 100 mg, Per Tube, BID, Duayne Cal, NP, 100 mg at 04/19/23 1478   fentaNYL (SUBLIMAZE) injection 25 mcg, 25 mcg, Intravenous, Q15 min PRN, Duayne Cal, NP   fentaNYL (SUBLIMAZE) injection 25-100 mcg, 25-100 mcg, Intravenous, Q30 min PRN, Duayne Cal, NP   levothyroxine (SYNTHROID) tablet 50 mcg, 50 mcg, Oral, Q0600, Caryl Pina, MD   Oral care mouth rinse, 15 mL, Mouth Rinse, PRN, Caryl Pina, MD   pantoprazole (PROTONIX) injection 40 mg, 40 mg, Intravenous, Q24H, Luciano Cutter, MD, 40 mg at 04/19/23 0900   polyethylene glycol (MIRALAX / GLYCOLAX) packet 17 g, 17 g, Per Tube, Daily, Duayne Cal, NP, 17 g at 04/19/23 0903   propofol (DIPRIVAN) 1000 MG/100ML infusion, 0-50 mcg/kg/min, Intravenous, Continuous, Duayne Cal, NP, Last Rate: 4.28 mL/hr at 04/19/23 1000, 15 mcg/kg/min at 04/19/23 1000   senna-docusate (Senokot-S) tablet 1 tablet, 1 tablet, Oral, QHS PRN, Caryl Pina, MD  Labs: CBC Recent Labs    04/19/23 0208 04/19/23 0209 04/19/23 0606  WBC 6.1  --   --   HGB 13.5 14.6 11.9*  HCT 42.2 43.0 35.0*  PLT 210  --   --    BMET Recent Labs    04/19/23 0208 04/19/23 0209 04/19/23 0606  NA 138 139 137  K 3.8 3.7 3.5  CL 103 105  --   CO2 23  --   --   GLUCOSE 133* 131*  --   BUN 18 20  --   CREATININE 1.33* 1.40*  --   CALCIUM  9.3  --   --    LFT Recent Labs    04/19/23 0208  PROT 6.7  ALBUMIN 3.6  AST 26  ALT 15  ALKPHOS 57  BILITOT 0.5   PT/INR Recent Labs    04/19/23 0208 04/19/23 0421  LABPROT 42.1* 46.1*  INR 4.4* 4.9*     Studies/Results: DG Abd 1 View Result Date: 04/19/2023 CLINICAL DATA:  Orogastric tube placement. EXAM: ABDOMEN - 1 VIEW COMPARISON:  None Available. FINDINGS: Distal tip of nasogastric tube is seen in expected position of stomach. No abnormal bowel dilatation. Possible gastrostomy tube is noted. IMPRESSION: Distal tip of nasogastric tube is  seen in expected position of stomach. Electronically Signed   By: Lupita Raider M.D.   On: 04/19/2023 08:38   Portable Chest x-ray Result Date: 04/19/2023 CLINICAL DATA:  Endotracheal tube. EXAM: PORTABLE CHEST 1 VIEW COMPARISON:  April 02, 2023. FINDINGS: Stable cardiomegaly. Endotracheal and nasogastric tubes are in grossly good position. Minimal right basilar subsegmental atelectasis is noted. Mild left retrocardiac opacity is noted concerning for atelectasis or infiltrate with probable small pleural effusion. Bony thorax is unremarkable. IMPRESSION: Support apparatus as noted above. Left retrocardiac opacity as noted above. Minimal right basilar atelectasis. Electronically Signed   By: Lupita Raider M.D.   On: 04/19/2023 08:36   CT ANGIO HEAD NECK W WO CM (CODE STROKE) Result Date: 04/19/2023 EXAM: CT ANGIOGRAPHY HEAD AND NECK WITH AND WITHOUT CONTRAST TECHNIQUE: Multidetector CT imaging of the head and neck was performed using the standard protocol during bolus administration of intravenous contrast. Multiplanar CT image reconstructions and MIPs were obtained to evaluate the vascular anatomy. Carotid stenosis measurements (when applicable) are obtained utilizing NASCET criteria, using the distal internal carotid diameter as the denominator. RADIATION DOSE REDUCTION: This exam was performed according to the departmental dose-optimization program which includes automated exposure control, adjustment of the mA and/or kV according to patient size and/or use of iterative reconstruction technique. CONTRAST:  75mL OMNIPAQUE IOHEXOL 350 MG/ML SOLN COMPARISON:  CT head from today. FINDINGS: CTA NECK FINDINGS Aortic arch: Great vessel origins are patent without significant stenosis. Right carotid system: No evidence of dissection, stenosis (50% or greater), or occlusion. Mildly diminished opacification the right ICA in the neck likely relates to the intracranial occlusion described below. Left carotid system:  No evidence of dissection, stenosis (50% or greater), or occlusion. Vertebral arteries: Codominant. No evidence of dissection, stenosis (50% or greater), or occlusion. Skeleton: No acute abnormality on limited assessment. Other neck: No acute abnormality on limited assessment. Upper chest: Visualized lung apices are clear. Review of the MIP images confirms the above findings CTA HEAD FINDINGS Anterior circulation: Bilateral intracranial ICAs are patent without significant stenosis. Occlusion versus critical stenosis of the right M1 MCA with asymmetrically diminished opacification of more distal right MCA vessels. Suspected thrombus is visualized within the M1 MCA and extends into proximal M2 MCA branches. Left MCAs and bilateral ACAs are patent. Posterior circulation: Bilateral intradural vertebral arteries, basilar artery and bilateral posterior arteries are patent without proximal in a mic least significant stenosis. Venous sinuses: Not well assessed due to arterial timing. Review of the MIP images confirms the above findings IMPRESSION: Occlusion versus critical stenosis of the right M1 MCA with asymmetrically diminished opacification of more distal right MCA vessels. Suspected thrombus is visualized within the M1 MCA and extends into proximal M2 MCA branches. Findings discussed with Dr. Otelia Limes via telephone at 2:26 a.m. Electronically Signed   By: Feliberto Harts  M.D.   On: 04/19/2023 02:37   CT HEAD CODE STROKE WO CONTRAST Result Date: 04/19/2023 CLINICAL DATA:  Code stroke.  Neuro deficit, acute, stroke suspected EXAM: CT HEAD WITHOUT CONTRAST TECHNIQUE: Contiguous axial images were obtained from the base of the skull through the vertex without intravenous contrast. RADIATION DOSE REDUCTION: This exam was performed according to the departmental dose-optimization program which includes automated exposure control, adjustment of the mA and/or kV according to patient size and/or use of iterative  reconstruction technique. COMPARISON:  CT March 23, 2023. FINDINGS: Brain: No evidence of acute large vascular territory infarction, hemorrhage, hydrocephalus, extra-axial collection or mass lesion/mass effect. Vascular: No hyperdense vessel. Skull: No acute fracture. Sinuses/Orbits: Left sphenoid sinus frothy secretions. No acute orbital findings. Other: No mastoid effusions. ASPECTS [facility] Stroke Program Early CT Score) Total score (0-10 with 10 being normal): 10. IMPRESSION: No evidence of acute intracranial abnormality. Code stroke imaging results were communicated on 04/19/2023 at 2:18 am to provider Dr. Otelia Limes via secure text paging. Electronically Signed   By: Feliberto Harts M.D.   On: 04/19/2023 02:18    Assessment/Plan: Occlusion of the right middle cerebral artery M1 segment including superior and inferior divisions.   Patient is currently intubated Patient follows commands and is able to move all 4 extremities. Tremor present, which is chronic. Right groin site unremarkable Please contact IR with further questions or concerns.      LOS: 0 days   I spent a total of 15 minutes in face to face in clinical consultation, greater than 50% of which was counseling/coordinating care for Occlusion of the right middle cerebral artery M1 segment including superior and inferior divisions.   Anell Barr Kristell Wooding PA-C 04/19/2023 11:26 AM

## 2023-04-19 NOTE — Evaluation (Signed)
 Speech Language Pathology Evaluation Patient Details Name: Frances Kent MRN: [id number] DOB: [birth year] Today's Date: 04/19/2023 Time: 4098-1191 SLP Time Calculation (min) (ACUTE ONLY): 15 min  Problem List:  Patient Active Problem List   Diagnosis Date Noted   Stroke (cerebrum) (HCC) 04/19/2023   Middle cerebral artery embolism, right 04/19/2023   Failure to thrive in adult 04/13/2023   Acute on chronic systolic CHF (congestive heart failure) (HCC) 04/04/2023   COVID-19 virus infection 03/23/2023   Counseling regarding goals of care 02/23/2023   Bilateral lower extremity edema 02/12/2023   Lump in neck 02/12/2023   Hyperkalemia 12/19/2022   Generalized weakness 11/26/2022   Pacemaker 07/06/2022   Eczema of eyelid 03/25/2022   Psoriasis of scalp 03/25/2022   Essential tremor 11/23/2021   Vertebral fracture, osteoporotic, sequela 02/23/2021   Depression with anxiety 02/02/2021   History of embolic stroke 12/08/2020   Inflamed external hemorrhoid 12/08/2020   Insomnia due to anxiety and fear 12/08/2020   Long term (current) use of anticoagulants    Chronic HFrEF (heart failure with reduced ejection fraction) (HCC)    Malnutrition of moderate degree (HCC) 11/03/2020   Endotracheal tube present    Prolonged QT interval    Chronic atrial fibrillation with RVR (HCC) 10/16/2020   Antibiotic-associated diarrhea 10/15/2020   ILD (interstitial lung disease) (HCC) 09/22/2020   Atrial flutter, paroxysmal (HCC) 09/10/2020   Sick sinus syndrome (HCC) 09/08/2020   Elevated serum free T4 level 09/08/2020   Purpura senilis (HCC) 08/28/2020   Hiatal hernia 08/09/2020   Acquired thrombophilia (HCC) 08/06/2020   Elevated troponin I level 05/21/2020   CVA (cerebral vascular accident) (HCC) 03/24/2020   History of COVID-19 03/03/2020   Bilateral carotid artery stenosis 12/23/2019   Schatzki's ring of distal esophagus 12/11/2019   Erosive gastritis 11/30/2019   Dysphagia 11/27/2019    Painful swallowing 11/27/2019   Gastroesophageal reflux disease 11/27/2019   Hypothyroidism 04/11/2019   Chronic kidney disease, stage 3b (HCC) 04/11/2019   Essential hypertension 09/12/2018   Hypothyroidism due to acquired atrophy of thyroid 05/08/2018   Unintentional weight loss 05/08/2018   Myalgia due to statin 05/08/2018   Abdominal aortic atherosclerosis (HCC) 05/07/2018   Bradycardia 04/28/2018   Pleural effusion on left 02/27/2018   Moderate mitral stenosis 01/10/2018   Lumbar radiculitis 01/07/2018   B12 deficiency 07/26/2017   Hypotension 04/06/2016   Vitamin D deficiency 04/08/2015   Cervical spine degeneration 12/13/2014   History of pulmonary embolism 12/02/2014   Insomnia 10/04/2014   Moderate tricuspid insufficiency 09/17/2014   Atrial fibrillation (HCC) 04/03/2014   Generalized anxiety disorder 03/28/2013   Mitral valve prolapse 03/28/2013   Routine adult health maintenance 03/23/2012   Fatigue 03/23/2012   Cough 04/17/2011   Screening for breast cancer 11/29/2010   Macular degeneration, left eye 11/29/2010   Screening for colon cancer 11/29/2010   Hyperlipidemia LDL goal <70 11/29/2010   GERD (gastroesophageal reflux disease)    Past Medical History:  Past Medical History:  Diagnosis Date   Arthritis    Atrial fibrillation (HCC)    Benign breast cyst in female, left 10/07/2016   Candida esophagitis (HCC) 05/03/2021   Found on Mar 10 2021 EGD    CAP (community acquired pneumonia) 02/15/2021   CHF (congestive heart failure) (HCC)    Colon adenomas    GERD (gastroesophageal reflux disease)    HCAP (healthcare-associated pneumonia) 02/02/2021   Hypertension    Hypothyroidism    Macular degeneration    Mitral regurgitation  Pulmonary embolism (HCC) 02/2016   Severe sepsis (HCC) 02/02/2021   Stroke (HCC) 10/2020   SVT (supraventricular tachycardia) (HCC)    Past Surgical History:  Past Surgical History:  Procedure Laterality Date   APPENDECTOMY   1960   BREAST CYST ASPIRATION Left 2017   CARDIOVERSION N/A 04/05/2016   Procedure: Cardioversion;  Surgeon: Lamar Blinks, MD;  Location: [facility] ORS;  Service: Cardiovascular;  Laterality: N/A;   CHOLECYSTECTOMY  1985   ESOPHAGOGASTRODUODENOSCOPY N/A 03/10/2021   Procedure: ESOPHAGOGASTRODUODENOSCOPY (EGD);  Surgeon: Toledo, Boykin Nearing, MD;  Location: [facility] ENDOSCOPY;  Service: Gastroenterology;  Laterality: N/A;   ESOPHAGOGASTRODUODENOSCOPY (EGD) WITH PROPOFOL N/A 11/27/2019   Procedure: ESOPHAGOGASTRODUODENOSCOPY (EGD) WITH PROPOFOL;  Surgeon: Regis Bill, MD;  Location: [facility] ENDOSCOPY;  Service: Endoscopy;  Laterality: N/A;   ESOPHAGOGASTRODUODENOSCOPY (EGD) WITH PROPOFOL N/A 12/24/2019   Procedure: ESOPHAGOGASTRODUODENOSCOPY (EGD) WITH PROPOFOL;  Surgeon: Regis Bill, MD;  Location: [facility] ENDOSCOPY;  Service: Endoscopy;  Laterality: N/A;   IR CT HEAD LTD  11/01/2020   IR PERCUTANEOUS ART THROMBECTOMY/INFUSION INTRACRANIAL INC DIAG ANGIO  11/01/2020   IR US GUIDE VASC ACCESS RIGHT  11/02/2020   OVARIAN CYST REMOVAL     PACEMAKER LEADLESS INSERTION N/A 11/16/2021   Procedure: PACEMAKER LEADLESS INSERTION;  Surgeon: Marcina Millard, MD;  Location: [facility];  Service: Cardiovascular;  Laterality: N/A;   RADIOLOGY WITH ANESTHESIA N/A 11/01/2020   Procedure: RADIOLOGY WITH ANESTHESIA;  Surgeon: Julieanne Cotton, MD;  Location: [facility] OR;  Service: Radiology;  Laterality: N/A;   TEE WITHOUT CARDIOVERSION N/A 02/01/2018   Procedure: TRANSESOPHAGEAL ECHOCARDIOGRAM (TEE);  Surgeon: Lamar Blinks, MD;  Location: [facility] ORS;  Service: Cardiovascular;  Laterality: N/A;   HPI:  Frances Kent is a 89 yo female presenting to ED 3/5 with acute onset of aphasia and L sided weakness. Pt previously seen by SLP in [facility] for cognitive impairments (problem solving, divergent naming, memory retrieval) s/p L basal ganglia CVA 2022. PMH includes A-fib, pacemaker, arthritis,  recent COVID infection, GERD, HFrEF, HTN, macular degeneration, hypothyroidism, mitral regurgitation, PE (2019), prior CVA without residual deficits, SVT   Assessment / Plan / Recommendation Clinical Impression  Patient presents with a mild dysarthria but with expressive and receptive language skills and cognitive function appearing WFL. Speech was 90% intelligible but quality of speech was impaired. Daughter also reported that she felt patient's voice was a little softer than normal. Patient speaks slowly and did tell SLP "Im not talking as plain" as she normally does. Of note, her top dentures are not in place and she does normally wear these all the time. Her daughter plans to clean them and have patient try them tomorrow to see how that affects her speech. Patient exhibits very good awareness, recall and understanding of recent medical and therapeutic interventions. Her comprehension and verbal expression at conversational level were [facility]. Complex problem solving and reasoning were not assessed at this time. SLP will follow patient briefly for speech intelligibility, but primarily will focus on dysphagia.    SLP Assessment  SLP Recommendation/Assessment: Patient needs continued Speech Lanaguage Pathology Services SLP Visit Diagnosis: Dysarthria and anarthria (R47.1)    Recommendations for follow up therapy are one component of a multi-disciplinary discharge planning process, led by the attending physician.  Recommendations may be updated based on patient status, additional functional criteria and insurance authorization.    Follow Up Recommendations  No SLP follow up    Assistance Recommended at Discharge  PRN  Functional Status Assessment  Patient has had a recent decline in their functional status and demonstrates the ability to make significant improvements in function in a reasonable and predictable amount of time.  Frequency and Duration min 1 x/week  1 week      SLP  Evaluation Cognition  Overall Cognitive Status: History of cognitive impairments - at baseline Arousal/Alertness: Awake/alert Orientation Level: Oriented X4 Memory: Appears intact Awareness: Appears intact       Comprehension  Auditory Comprehension Overall Auditory Comprehension: Appears within functional limits for tasks assessed    Expression Expression Primary Mode of Expression: Verbal Verbal Expression Overall Verbal Expression: Appears within functional limits for tasks assessed   Oral / Motor  Oral Motor/Sensory Function Overall Oral Motor/Sensory Function: Mild impairment Facial ROM: Reduced right Facial Symmetry: Abnormal symmetry right Facial Strength: Reduced right Lingual ROM: Reduced right;Reduced left Lingual Symmetry: Within Functional Limits Lingual Strength: Reduced Motor Speech Overall Motor Speech: Impaired at baseline Respiration: Within functional limits Phonation: Low vocal intensity Resonance: Within functional limits Articulation: Impaired Level of Impairment: Conversation Intelligibility: Intelligibility reduced Word: 75-100% accurate Phrase: 75-100% accurate Sentence: 75-100% accurate Conversation: 75-100% accurate Motor Planning: Witnin functional limits Motor Speech Errors: Not applicable Interfering Components: Premorbid status;Inadequate dentition           Angela Nevin, MA, CCC-SLP Speech Therapy

## 2023-04-19 NOTE — IPAL (Signed)
  Interdisciplinary Goals of Care Family Meeting   Date carried out: 04/19/2023  Location of the meeting: Bedside  Member's involved: Physician and Family Member or next of kin  Durable Power of Attorney or acting medical decision maker: Daughter and son    Discussion: We discussed goals of care for Frances Kent .  Updated on her current condition. Family reports patient would not want chest compressions. OK with intubation for now since this is peri-procedure. But may want to be DNI in the future after this hospitalization  Code status:   Code Status: Do not attempt resuscitation (DNR) PRE-ARREST INTERVENTIONS DESIRED DNR. Intubation ok this admission  Disposition: Continue current acute care  Time spent for the meeting: 15 min    Frances Wimberly Mechele Collin, MD  04/19/2023, 8:14 AM

## 2023-04-19 NOTE — ED Provider Notes (Addendum)
 [place] EMERGENCY DEPARTMENT AT [facility] Provider Note   CSN: [id number] Arrival date & time: 04/19/23  4098  An emergency department physician performed an initial assessment on this suspected stroke patient at 0202.  History  Chief Complaint  Patient presents with   Code Stroke    Frances Kent is a 89 y.o. female.  16-year-old female here today for left-sided weakness, and aphasia last known well within 6 hours.  Patient is on Xarelto for A-fib.        Home Medications Prior to Admission medications   Medication Sig Start Date End Date Taking? Authorizing Provider  acetaminophen (TYLENOL) 500 MG tablet Take 500 mg by mouth every 6 (six) hours as needed.    [provider]  furosemide (LASIX) 20 MG tablet Take 1 tablet (20 mg total) by mouth daily. 04/12/23   Sherlene Shams, MD  levalbuterol (XOPENEX) 0.63 MG/3ML nebulizer solution USE 1  VIAL IN NEBULIZER EVERY 4 HOURS AS NEEDED FOR WHEEZING AND FOR SHORTNESS OF BREATH 12/12/22   Salena Saner, MD  levothyroxine (SYNTHROID) 50 MCG tablet Take 1 tablet (50 mcg total) by mouth daily before breakfast. 03/22/23   Sherlene Shams, MD  metoprolol succinate (TOPROL-XL) 25 MG 24 hr tablet Take 1 tablet (25 mg total) by mouth 2 (two) times daily. Take with or immediately following a meal. 04/12/23   Sherlene Shams, MD  pantoprazole (PROTONIX) 20 MG tablet Take 1 tablet (20 mg total) by mouth daily. 04/12/23   Sherlene Shams, MD  potassium chloride SA (KLOR-CON M) 20 MEQ tablet Take 1 tablet (20 mEq total) by mouth daily. 04/01/23   Sherlene Shams, MD  Probiotic CHEW Chew 2 tablets by mouth daily.    [provider]  traZODone (DESYREL) 50 MG tablet Take 0.5-1 tablets (25-50 mg total) by mouth at bedtime as needed for sleep. 01/06/23   Sherlene Shams, MD  XARELTO 15 MG TABS tablet TAKE 1 TABLET EVERY DAY 03/10/23   Sherlene Shams, MD      Allergies    Statins    Review of Systems   Review of  Systems  Physical Exam Updated Vital Signs BP (!) 169/79   Pulse 64   Temp (!) 95.8 F (35.4 C) (Rectal)   Resp (!) 27   Ht 5\' 3"  (1.6 m)   Wt 47.6 kg   SpO2 100%   BMI 18.59 kg/m  Physical Exam Vitals reviewed.  Constitutional:      Appearance: She is ill-appearing.  Neurological:     Comments: Left arm flaccid paralysis, no effort against gravity.  Aphasia.  Left-sided facial droop     ED Results / Procedures / Treatments   Labs (all labs ordered are listed, but only abnormal results are displayed) Labs Reviewed  PROTIME-INR - Abnormal; Notable for the following components:      Result Value   Prothrombin Time 42.1 (*)    INR 4.4 (*)    All other components within normal limits  APTT - Abnormal; Notable for the following components:   aPTT 48 (*)    All other components within normal limits  CBC - Abnormal; Notable for the following components:   RDW 16.3 (*)    All other components within normal limits  COMPREHENSIVE METABOLIC PANEL - Abnormal; Notable for the following components:   Glucose, Bld 133 (*)    Creatinine, Ser 1.33 (*)    GFR, Estimated 38 (*)  All other components within normal limits  I-STAT CHEM 8, ED - Abnormal; Notable for the following components:   Creatinine, Ser 1.40 (*)    Glucose, Bld 131 (*)    Calcium, Ion 1.11 (*)    All other components within normal limits  ETHANOL  DIFFERENTIAL  RAPID URINE DRUG SCREEN, HOSP PERFORMED  URINALYSIS, ROUTINE W REFLEX MICROSCOPIC    EKG None  Radiology CT ANGIO HEAD NECK W WO CM (CODE STROKE) Result Date: 04/19/2023 EXAM: CT ANGIOGRAPHY HEAD AND NECK WITH AND WITHOUT CONTRAST TECHNIQUE: Multidetector CT imaging of the head and neck was performed using the standard protocol during bolus administration of intravenous contrast. Multiplanar CT image reconstructions and MIPs were obtained to evaluate the vascular anatomy. Carotid stenosis measurements (when applicable) are obtained utilizing NASCET  criteria, using the distal internal carotid diameter as the denominator. RADIATION DOSE REDUCTION: This exam was performed according to the departmental dose-optimization program which includes automated exposure control, adjustment of the mA and/or kV according to patient size and/or use of iterative reconstruction technique. CONTRAST:  75mL OMNIPAQUE IOHEXOL 350 MG/ML SOLN COMPARISON:  CT head from today. FINDINGS: CTA NECK FINDINGS Aortic arch: Great vessel origins are patent without significant stenosis. Right carotid system: No evidence of dissection, stenosis (50% or greater), or occlusion. Mildly diminished opacification the right ICA in the neck likely relates to the intracranial occlusion described below. Left carotid system: No evidence of dissection, stenosis (50% or greater), or occlusion. Vertebral arteries: Codominant. No evidence of dissection, stenosis (50% or greater), or occlusion. Skeleton: No acute abnormality on limited assessment. Other neck: No acute abnormality on limited assessment. Upper chest: Visualized lung apices are clear. Review of the MIP images confirms the above findings CTA HEAD FINDINGS Anterior circulation: Bilateral intracranial ICAs are patent without significant stenosis. Occlusion versus critical stenosis of the right M1 MCA with asymmetrically diminished opacification of more distal right MCA vessels. Suspected thrombus is visualized within the M1 MCA and extends into proximal M2 MCA branches. Left MCAs and bilateral ACAs are patent. Posterior circulation: Bilateral intradural vertebral arteries, basilar artery and bilateral posterior arteries are patent without proximal in a mic least significant stenosis. Venous sinuses: Not well assessed due to arterial timing. Review of the MIP images confirms the above findings IMPRESSION: Occlusion versus critical stenosis of the right M1 MCA with asymmetrically diminished opacification of more distal right MCA vessels. Suspected  thrombus is visualized within the M1 MCA and extends into proximal M2 MCA branches. Findings discussed with Dr. Otelia Limes via telephone at 2:26 a.m. Electronically Signed   By: Feliberto Harts M.D.   On: 04/19/2023 02:37   CT HEAD CODE STROKE WO CONTRAST Result Date: 04/19/2023 CLINICAL DATA:  Code stroke.  Neuro deficit, acute, stroke suspected EXAM: CT HEAD WITHOUT CONTRAST TECHNIQUE: Contiguous axial images were obtained from the base of the skull through the vertex without intravenous contrast. RADIATION DOSE REDUCTION: This exam was performed according to the departmental dose-optimization program which includes automated exposure control, adjustment of the mA and/or kV according to patient size and/or use of iterative reconstruction technique. COMPARISON:  CT March 23, 2023. FINDINGS: Brain: No evidence of acute large vascular territory infarction, hemorrhage, hydrocephalus, extra-axial collection or mass lesion/mass effect. Vascular: No hyperdense vessel. Skull: No acute fracture. Sinuses/Orbits: Left sphenoid sinus frothy secretions. No acute orbital findings. Other: No mastoid effusions. ASPECTS [facility] Stroke Program Early CT Score) Total score (0-10 with 10 being normal): 10. IMPRESSION: No evidence of acute intracranial abnormality. Code stroke imaging  results were communicated on 04/19/2023 at 2:18 am to provider Dr. Otelia Limes via secure text paging. Electronically Signed   By: Feliberto Harts M.D.   On: 04/19/2023 02:18    Procedures .Critical Care  Performed by: Arletha Pili, DO Authorized by: Arletha Pili, DO   Critical care provider statement:    Critical care time (minutes):  30   Critical care was necessary to treat or prevent imminent or life-threatening deterioration of the following conditions:  CNS failure or compromise   Critical care was time spent personally by me on the following activities:  Development of treatment plan with patient or surrogate, discussions with  consultants, evaluation of patient's response to treatment, examination of patient, ordering and review of laboratory studies, ordering and review of radiographic studies, ordering and performing treatments and interventions, pulse oximetry, re-evaluation of patient's condition and review of old charts     Medications Ordered in ED Medications  iohexol (OMNIPAQUE) 350 MG/ML injection 75 mL (75 mLs Intravenous Contrast Given 04/19/23 0223)    ED Course/ Medical Decision Making/ A&P                                 Medical Decision Making 89 year old female here today for stroke symptoms.  Plan # patient CTA shows right M1 occlusion.  With her being on blood thinners, and symptoms occurring within 24 hours, is a candidate for thrombectomy.  Stroke care coordinated via neurologist Dr. Otelia Limes.  Patient protecting airway.  Her temp is 95.8 rectally.  Will not actively rewarm this patient at this time as this type may be beneficial in the setting of acute stroke.  Amount and/or Complexity of Data Reviewed Labs: ordered. Radiology: ordered.           Final Clinical Impression(s) / ED Diagnoses Final diagnoses:  Cerebrovascular accident (CVA) due to thrombosis of right middle cerebral artery [facility])    Rx / DC Orders ED Discharge Orders     None         Anders Simmonds T, DO 04/19/23 0255    Anders Simmonds T, DO 04/19/23 [phone]

## 2023-04-19 NOTE — Procedures (Signed)
 INR.  Status post right common carotid arteriogram  Right CFA approach.  Findings.  1.  Occluded  right middle cerebral artery M1 segment including the superior and inferior divisions.  Status post complete revascularization of the occluded distal MCA M1 segment with 1 pass with contact  aspiration, and 1 pass with contact aspiration and  4 mm x 40 mm solitaire X retriever  device achieving a TICI 3 revascularization.  Status post aspiration of proximal A3 segment of the right anterior cerebral artery with 1 pass with contact  aspiration achieving a TICI 2C  revascularization.  Post CT no evidence of intracranial hemorrhage.  8 French Angio-Seal closure device deployed for hemostasis and the right groin puncture.  Distal pulses all present unchanged from prior to procedure.  Patient left intubated due to her medical condition to protect her airway as per anesthesia.  S.Arron Mcnaught MD

## 2023-04-19 NOTE — Anesthesia Preprocedure Evaluation (Signed)
 Anesthesia Evaluation  Patient identified by MRN, date of birth, ID band Patient confused    Reviewed: Allergy & Precautions, H&P , NPO status , Patient's Chart, lab work & pertinent test results  Airway Mallampati: II  TM Distance: >3 FB Neck ROM: Full    Dental no notable dental hx. (+) Edentulous Upper, Partial Lower   Pulmonary neg pulmonary ROS   Pulmonary exam normal breath sounds clear to auscultation       Cardiovascular hypertension, Pt. on medications and Pt. on home beta blockers +CHF  + dysrhythmias Atrial Fibrillation + Valvular Problems/Murmurs MR  Rhythm:Regular Rate:Normal     Neuro/Psych   Anxiety Depression    CVA, Residual Symptoms    GI/Hepatic Neg liver ROS, hiatal hernia, PUD,GERD  Medicated,,  Endo/Other  Hypothyroidism    Renal/GU negative Renal ROS  negative genitourinary   Musculoskeletal  (+) Arthritis , Osteoarthritis,    Abdominal   Peds  Hematology negative hematology ROS (+)   Anesthesia Other Findings   Reproductive/Obstetrics negative OB ROS                             Anesthesia Physical Anesthesia Plan  ASA: 3 and emergent  Anesthesia Plan: General   Post-op Pain Management: Minimal or no pain anticipated   Induction: Rapid sequence and Cricoid pressure planned  PONV Risk Score and Plan: 3 and Ondansetron and Dexamethasone  Airway Management Planned: Oral ETT  Additional Equipment:   Intra-op Plan:   Post-operative Plan: Possible Post-op intubation/ventilation  Informed Consent: I have reviewed the patients History and Physical, chart, labs and discussed the procedure including the risks, benefits and alternatives for the proposed anesthesia with the patient or authorized representative who has indicated his/her understanding and acceptance.     Dental advisory given  Plan Discussed with: CRNA  Anesthesia Plan Comments:         Anesthesia Quick Evaluation

## 2023-04-19 NOTE — Progress Notes (Signed)
  Inpatient Rehab Admissions Coordinator :  Per therapy recommendations, patient was screened for CIR candidacy by Ottie Glazier RN MSN.  At this time patient appears to be a potential candidate for CIR. I will place a rehab consult per protocol for full assessment. Please call me with any questions.  Ottie Glazier RN MSN Admissions Coordinator [phone]

## 2023-04-19 NOTE — Evaluation (Signed)
 Occupational Therapy Evaluation Patient Details Name: Frances Kent MRN: [id number] DOB: [birth year] Today's Date: 04/19/2023   History of Present Illness   89 y.o. female presents to [facility] hospital on 04/19/2023 with aphasia and L weakness. CTA with critical stenosis of R M1 MCA. Pt underwent mechanical thrombectomy, remaining intubated post-procedure. Extubated 3/5. PMH includes afib, PPM, HFrEF, HTN, hypothyroidism, prior CVA with residual R weakness.     Clinical Impressions PT admitted with CVA. Pt currently with functional limitiations due to the deficits listed below (see OT problem list). Pt lives at home alone and ambulates with RW. Pt with prior inpatient rehab experience with return to home so very eager to engage in therapy. Pt currently with balance deficits with L lateral lean.  Pt will benefit from skilled OT to increase their independence and safety with adls and balance to allow discharge Patient will benefit from intensive inpatient follow-up therapy, >3 hours/day  Call me Charisse     If plan is discharge home, recommend the following:   Two people to help with walking and/or transfers;Two people to help with bathing/dressing/bathroom     Functional Status Assessment   Patient has had a recent decline in their functional status and demonstrates the ability to make significant improvements in function in a reasonable and predictable amount of time.     Equipment Recommendations   BSC/3in1;Wheelchair (measurements OT);Wheelchair cushion (measurements OT)     Recommendations for Other Services   Rehab consult;Speech consult;PT consult     Precautions/Restrictions   Precautions Precautions: Fall Restrictions Weight Bearing Restrictions Per Provider Order: No     Mobility Bed Mobility Overal bed mobility: Needs Assistance Bed Mobility: Rolling, Supine to Sit Rolling: Min assist   Supine to sit: Min assist     General bed mobility comments: pt needs  cues to sequence task and help getting onto L elbow and then able to initiate pushing into sitting.    Transfers Overall transfer level: Needs assistance Equipment used: 2 person hand held assist Transfers: Sit to/from Stand, Bed to chair/wheelchair/BSC Sit to Stand: +2 physical assistance, Mod assist           General transfer comment: pt relies on R UE to hold and strong L lateral lean      Balance Overall balance assessment: Needs assistance Sitting-balance support: Single extremity supported, Feet supported Sitting balance-Leahy Scale: Poor   Postural control: Left lateral lean Standing balance support: Single extremity supported, During functional activity Standing balance-Leahy Scale: Poor                             ADL either performed or assessed with clinical judgement   ADL Overall ADL's : Needs assistance/impaired Eating/Feeding: NPO   Grooming: Moderate assistance   Upper Body Bathing: Moderate assistance   Lower Body Bathing: Maximal assistance   Upper Body Dressing : Moderate assistance   Lower Body Dressing: Maximal assistance   Toilet Transfer: Moderate assistance;+2 for physical assistance;+2 for safety/equipment;Stand-pivot                   Vision Baseline Vision/History: 1 Wears glasses Vision Assessment?: Wears glasses for reading Additional Comments: no glasses present but visually scanning therapist during session     Perception         Praxis         Pertinent Vitals/Pain Pain Assessment Pain Assessment: No/denies pain     Extremity/Trunk Assessment Upper Extremity Assessment Upper Extremity  Assessment: Right hand dominant;RUE deficits/detail;LUE deficits/detail RUE Deficits / Details: baseline tremor, prior decreased coordination of R hand after stroke 3 years ago the patietn reports. pt holds digits in claw position but can fully extend with cues and time. No prior splint wear LUE Deficits / Details:  baseline tremor noted   Lower Extremity Assessment Lower Extremity Assessment: Defer to PT evaluation   Cervical / Trunk Assessment Cervical / Trunk Assessment: Kyphotic   Communication Communication Communication: No apparent difficulties   Cognition Arousal: Alert Behavior During Therapy: WFL for tasks assessed/performed Cognition: No apparent impairments                               Following commands: Intact       Cueing  General Comments      VSS on RA   Exercises     Shoulder Instructions      Home Living Family/patient expects to be discharged to:: Private residence Living Arrangements: Alone Available Help at Discharge: Family Type of Home: House Home Access: Level entry (front entrance with 1STE)     Home Layout: One level;Laundry or work area in basement     [employer] Shower/Tub: Producer, television/film/video: Standard     Home Equipment: Agricultural consultant (2 wheels);Shower seat;BSC/3in1;Toilet riser;Grab bars - toilet;Lift chair   Additional Comments: has x2 dogs great great daughters on property that daugther cares for. pt has no animal care needs. pt gets all meals provided by daughters that live on the same price of land. one daughter had pins placed in knee in less than a week ago so will have limited physcial (A) for the patient      Prior Functioning/Environment Prior Level of Function : Needs assist             Mobility Comments: ambulatory with RW ADLs Comments: sponge bathing, assist for IADLs    OT Problem List: Decreased strength;Impaired balance (sitting and/or standing);Decreased coordination;Decreased safety awareness;Decreased knowledge of use of DME or AE;Decreased knowledge of precautions   OT Treatment/Interventions: Self-care/ADL training;Therapeutic exercise;Neuromuscular education;Energy conservation;Manual therapy;DME and/or AE instruction;Modalities;Therapeutic activities;Patient/family education;Balance  training      OT Goals(Current goals can be found in the care plan section)   Acute Rehab OT Goals Patient Stated Goal: to return home OT Goal Formulation: With patient/family Time For Goal Achievement: 05/03/23 Potential to Achieve Goals: Good   OT Frequency:  Min 2X/week    Co-evaluation PT/OT/SLP Co-Evaluation/Treatment: Yes Reason for Co-Treatment: For patient/therapist safety;To address functional/ADL transfers   OT goals addressed during session: ADL's and self-care;Proper use of Adaptive equipment and DME      AM-PAC OT "6 Clicks" Daily Activity     Outcome Measure Help from another person eating meals?: A Little Help from another person taking care of personal grooming?: A Little Help from another person toileting, which includes using toliet, bedpan, or urinal?: A Lot Help from another person bathing (including washing, rinsing, drying)?: A Lot Help from another person to put on and taking off regular upper body clothing?: A Lot Help from another person to put on and taking off regular lower body clothing?: Total 6 Click Score: 13   End of Session Equipment Utilized During Treatment: Gait belt Nurse Communication: Mobility status;Precautions  Activity Tolerance: Patient tolerated treatment well Patient left: in chair;with call bell/phone within reach;with chair alarm set  OT Visit Diagnosis: Unsteadiness on feet (R26.81);Muscle weakness (generalized) (M62.81)  Time: 2130-8657 OT Time Calculation (min): 25 min Charges:  OT General Charges $OT Visit: 1 Visit OT Evaluation $OT Eval Moderate Complexity: 1 Mod   Brynn, OTR/L  Acute Rehabilitation Services Office: [phone] .   Mateo Flow 04/19/2023, 4:10 PM

## 2023-04-19 NOTE — Progress Notes (Signed)
 STROKE TEAM PROGRESS NOTE   SUBJECTIVE (INTERVAL HISTORY) Her daughter is at the bedside.  Overall her condition is rapidly improving. She is still intubated, but eyes open, following all simple commands. No significant weakness on the left. Off sedation, plan to extubate soon. Pending MRI.    OBJECTIVE Temp:  [95.8 F (35.4 C)-97.7 F (36.5 C)] 97.7 F (36.5 C) (03/05 0800) Pulse Rate:  [50-71] 58 (03/05 1000) Cardiac Rhythm: Sinus bradycardia (03/05 0800) Resp:  [13-27] 23 (03/05 1000) BP: (117-169)/(58-118) 135/78 (03/05 1000) SpO2:  [96 %-100 %] 100 % (03/05 1000) FiO2 (%):  [40 %-60 %] 40 % (03/05 0800) Weight:  [47.6 kg] 47.6 kg (03/05 0200)  No results for input(s): "GLUCAP" in the last 168 hours. Recent Labs  Lab 04/14/23 1421 04/19/23 0208 04/19/23 0209 04/19/23 0606  NA 142 138 139 137  K 4.6 3.8 3.7 3.5  CL 100 103 105  --   CO2 22 23  --   --   GLUCOSE 99 133* 131*  --   BUN 28 18 20   --   CREATININE 1.78* 1.33* 1.40*  --   CALCIUM 9.6 9.3  --   --    Recent Labs  Lab 04/19/23 0208  AST 26  ALT 15  ALKPHOS 57  BILITOT 0.5  PROT 6.7  ALBUMIN 3.6   Recent Labs  Lab 04/19/23 0208 04/19/23 0209 04/19/23 0606  WBC 6.1  --   --   NEUTROABS 3.0  --   --   HGB 13.5 14.6 11.9*  HCT 42.2 43.0 35.0*  MCV 90.8  --   --   PLT 210  --   --    No results for input(s): "CKTOTAL", "CKMB", "CKMBINDEX", "TROPONINI" in the last 168 hours. Recent Labs    04/19/23 0208 04/19/23 0421  LABPROT 42.1* 46.1*  INR 4.4* 4.9*   Recent Labs    04/19/23 0553  COLORURINE YELLOW  LABSPEC >1.046*  PHURINE 5.0  GLUCOSEU NEGATIVE  HGBUR MODERATE*  BILIRUBINUR NEGATIVE  KETONESUR NEGATIVE  PROTEINUR NEGATIVE  NITRITE NEGATIVE  LEUKOCYTESUR SMALL*       Component Value Date/Time   CHOL 239 (H) 04/19/2023 0548   TRIG 69 04/19/2023 0548   HDL 82 04/19/2023 0548   CHOLHDL 2.9 04/19/2023 0548   VLDL 14 04/19/2023 0548   LDLCALC 143 (H) 04/19/2023 0548   Lab  Results  Component Value Date   HGBA1C 6.2 (H) 04/19/2023      Component Value Date/Time   LABOPIA NONE DETECTED 04/19/2023 0625   COCAINSCRNUR NONE DETECTED 04/19/2023 0625   LABBENZ NONE DETECTED 04/19/2023 0625   AMPHETMU NONE DETECTED 04/19/2023 0625   THCU NONE DETECTED 04/19/2023 0625   LABBARB NONE DETECTED 04/19/2023 0625    Recent Labs  Lab 04/19/23 0208  ETH <10    I have personally reviewed the radiological images below and agree with the radiology interpretations.  DG Abd 1 View Result Date: 04/19/2023 CLINICAL DATA:  Orogastric tube placement. EXAM: ABDOMEN - 1 VIEW COMPARISON:  None Available. FINDINGS: Distal tip of nasogastric tube is seen in expected position of stomach. No abnormal bowel dilatation. Possible gastrostomy tube is noted. IMPRESSION: Distal tip of nasogastric tube is seen in expected position of stomach. Electronically Signed   By: Lupita Raider M.D.   On: 04/19/2023 08:38   Portable Chest x-ray Result Date: 04/19/2023 CLINICAL DATA:  Endotracheal tube. EXAM: PORTABLE CHEST 1 VIEW COMPARISON:  April 02, 2023. FINDINGS: Stable cardiomegaly. Endotracheal  and nasogastric tubes are in grossly good position. Minimal right basilar subsegmental atelectasis is noted. Mild left retrocardiac opacity is noted concerning for atelectasis or infiltrate with probable small pleural effusion. Bony thorax is unremarkable. IMPRESSION: Support apparatus as noted above. Left retrocardiac opacity as noted above. Minimal right basilar atelectasis. Electronically Signed   By: Lupita Raider M.D.   On: 04/19/2023 08:36   CT ANGIO HEAD NECK W WO CM (CODE STROKE) Result Date: 04/19/2023 EXAM: CT ANGIOGRAPHY HEAD AND NECK WITH AND WITHOUT CONTRAST TECHNIQUE: Multidetector CT imaging of the head and neck was performed using the standard protocol during bolus administration of intravenous contrast. Multiplanar CT image reconstructions and MIPs were obtained to evaluate the vascular  anatomy. Carotid stenosis measurements (when applicable) are obtained utilizing NASCET criteria, using the distal internal carotid diameter as the denominator. RADIATION DOSE REDUCTION: This exam was performed according to the departmental dose-optimization program which includes automated exposure control, adjustment of the mA and/or kV according to patient size and/or use of iterative reconstruction technique. CONTRAST:  75mL OMNIPAQUE IOHEXOL 350 MG/ML SOLN COMPARISON:  CT head from today. FINDINGS: CTA NECK FINDINGS Aortic arch: Great vessel origins are patent without significant stenosis. Right carotid system: No evidence of dissection, stenosis (50% or greater), or occlusion. Mildly diminished opacification the right ICA in the neck likely relates to the intracranial occlusion described below. Left carotid system: No evidence of dissection, stenosis (50% or greater), or occlusion. Vertebral arteries: Codominant. No evidence of dissection, stenosis (50% or greater), or occlusion. Skeleton: No acute abnormality on limited assessment. Other neck: No acute abnormality on limited assessment. Upper chest: Visualized lung apices are clear. Review of the MIP images confirms the above findings CTA HEAD FINDINGS Anterior circulation: Bilateral intracranial ICAs are patent without significant stenosis. Occlusion versus critical stenosis of the right M1 MCA with asymmetrically diminished opacification of more distal right MCA vessels. Suspected thrombus is visualized within the M1 MCA and extends into proximal M2 MCA branches. Left MCAs and bilateral ACAs are patent. Posterior circulation: Bilateral intradural vertebral arteries, basilar artery and bilateral posterior arteries are patent without proximal in a mic least significant stenosis. Venous sinuses: Not well assessed due to arterial timing. Review of the MIP images confirms the above findings IMPRESSION: Occlusion versus critical stenosis of the right M1 MCA with  asymmetrically diminished opacification of more distal right MCA vessels. Suspected thrombus is visualized within the M1 MCA and extends into proximal M2 MCA branches. Findings discussed with Dr. Otelia Limes via telephone at 2:26 a.m. Electronically Signed   By: Feliberto Harts M.D.   On: 04/19/2023 02:37   CT HEAD CODE STROKE WO CONTRAST Result Date: 04/19/2023 CLINICAL DATA:  Code stroke.  Neuro deficit, acute, stroke suspected EXAM: CT HEAD WITHOUT CONTRAST TECHNIQUE: Contiguous axial images were obtained from the base of the skull through the vertex without intravenous contrast. RADIATION DOSE REDUCTION: This exam was performed according to the departmental dose-optimization program which includes automated exposure control, adjustment of the mA and/or kV according to patient size and/or use of iterative reconstruction technique. COMPARISON:  CT March 23, 2023. FINDINGS: Brain: No evidence of acute large vascular territory infarction, hemorrhage, hydrocephalus, extra-axial collection or mass lesion/mass effect. Vascular: No hyperdense vessel. Skull: No acute fracture. Sinuses/Orbits: Left sphenoid sinus frothy secretions. No acute orbital findings. Other: No mastoid effusions. ASPECTS [facility] Stroke Program Early CT Score) Total score (0-10 with 10 being normal): 10. IMPRESSION: No evidence of acute intracranial abnormality. Code stroke imaging results were communicated  on 04/19/2023 at 2:18 am to provider Dr. Otelia Limes via secure text paging. Electronically Signed   By: Feliberto Harts M.D.   On: 04/19/2023 02:18   CT CHEST WO CONTRAST Result Date: 04/03/2023 CLINICAL DATA:  Chronic dyspnea with unclear etiology EXAM: CT CHEST WITHOUT CONTRAST TECHNIQUE: Multidetector CT imaging of the chest was performed following the standard protocol without IV contrast. RADIATION DOSE REDUCTION: This exam was performed according to the departmental dose-optimization program which includes automated exposure control,  adjustment of the mA and/or kV according to patient size and/or use of iterative reconstruction technique. COMPARISON:  Radiograph from yesterday FINDINGS: Cardiovascular: Cardiac enlargement. Small low-density pericardial effusion measuring up to 9 mm in thickness posteriorly. There is atheromatous calcification. Prominent mitral annular calcification. Lead less pacer at the right ventricular apex. Mediastinum/Nodes: No worrisome lymph node enlargement, mass, or esophageal thickening. Lungs/Pleura: Biapical pleural based scarring. Central airways are clear. Minor dependent atelectasis. There is no consolidation, effusion, or pneumothorax. Slight interlobular septal thickening best seen on coronal reformats. Upper Abdomen: Cholecystectomy clips and atheromatous calcification. Musculoskeletal: T11 compression fracture with moderate height loss, nonacute and known from 2023 MRI. IMPRESSION: 1. Slight thickening of the interstitium may be minimal pulmonary edema. There is chronic cardiomegaly with small pericardial effusion. 2. Negative for pneumonia. Electronically Signed   By: Tiburcio Pea M.D.   On: 04/03/2023 07:28   DG Chest 2 View Result Date: 04/02/2023 CLINICAL DATA:  Dyspnea, atrial fibrillation EXAM: CHEST - 2 VIEW COMPARISON:  03/23/2023 FINDINGS: The lungs are symmetrically well expanded. Small left pleural effusion is present. Chronic interstitial thickening again noted though superimposed interstitial pulmonary edema has resolved. No pneumothorax. Stable cardiomegaly. Leadless pacemaker again noted. No acute bone abnormality. IMPRESSION: 1. Small left pleural effusion. 2. Cardiomegaly. 3. Resolved pulmonary edema Electronically Signed   By: Helyn Numbers M.D.   On: 04/02/2023 19:41   DG Chest 1 View Result Date: 03/23/2023 CLINICAL DATA:  89 year old female with altered mental status, CHF. EXAM: CHEST  1 VIEW COMPARISON:  Portable chest yesterday and earlier. FINDINGS: Portable AP semi upright  view at 0900 hours. Mildly improved lung volumes. Stable cardiomegaly and mediastinal contours. Left chest loop recorder or superficial ICD redemonstrated. Small pleural effusions are less apparent, lung base ventilation mildly improved. Stable pulmonary vascularity which has regressed since last month. No pneumothorax. No areas of worsening ventilation. Stable visualized osseous structures. Negative visible bowel gas. IMPRESSION: Cardiomegaly with mildly improved lung volumes and lung base ventilation. Suspected small pleural effusions less apparent. Stable vascular congestion, improved from last month. Electronically Signed   By: Odessa Fleming M.D.   On: 03/23/2023 09:20   CT HEAD WO CONTRAST ( ) Result Date: 03/23/2023 CLINICAL DATA:  Altered mental status EXAM: CT HEAD WITHOUT CONTRAST TECHNIQUE: Contiguous axial images were obtained from the base of the skull through the vertex without intravenous contrast. RADIATION DOSE REDUCTION: This exam was performed according to the departmental dose-optimization program which includes automated exposure control, adjustment of the mA and/or kV according to patient size and/or use of iterative reconstruction technique. COMPARISON:  None Available. FINDINGS: Brain: There is no mass, hemorrhage or extra-axial collection. There is generalized atrophy without lobar predilection. Hypodensity of the white matter is most commonly associated with chronic microvascular disease. Vascular: No hyperdense vessel or unexpected vascular calcification. Skull: The visualized skull base, calvarium and extracranial soft tissues are normal. Sinuses/Orbits: No fluid levels or advanced mucosal thickening of the visualized paranasal sinuses. No mastoid or middle ear effusion. Normal orbits. Other:  None. IMPRESSION: 1. No acute intracranial abnormality. 2. Generalized atrophy and findings of chronic microvascular disease. Electronically Signed   By: Deatra Robinson M.D.   On: 03/23/2023 03:12   DG  Chest Port 1 View Result Date: 03/22/2023 CLINICAL DATA:  Cough EXAM: PORTABLE CHEST 1 VIEW COMPARISON:  03/14/2023 FINDINGS: Moderate cardiomegaly with multifocal interstitial opacity, unchanged. No pleural effusion or pneumothorax. No focal airspace consolidation. IMPRESSION: Moderate cardiomegaly with multifocal interstitial opacity, unchanged and likely chronic. Electronically Signed   By: Deatra Robinson M.D.   On: 03/22/2023 22:36     PHYSICAL EXAM  Temp:  [95.8 F (35.4 C)-97.7 F (36.5 C)] 97.7 F (36.5 C) (03/05 0800) Pulse Rate:  [50-71] 58 (03/05 1000) Resp:  [13-27] 23 (03/05 1000) BP: (117-169)/(58-118) 135/78 (03/05 1000) SpO2:  [96 %-100 %] 100 % (03/05 1000) FiO2 (%):  [40 %-60 %] 40 % (03/05 0800) Weight:  [47.6 kg] 47.6 kg (03/05 0200)  General - Well nourished, well developed, in no apparent distress but intubated.  Ophthalmologic - fundi not visualized due to noncooperation.  Cardiovascular - irregularly irregular heart rate and rhythm.  Neuro - awake, alert, eyes open, still intubated, not able answer questions but following all simple commands. No gaze palsy, tracking bilaterally, blinking to visual threat on the right but inconsistent on the left, PERRL. Facial symmetry not able to test due to ET. Tongue midline in mouth. Bilateral UEs 4/5, no drift. Bilaterally LEs 3/5 proximal and 4/5 distal on the L but 3/5 on the right. Sensation not cooperative but b/l FTN grossly intact, gait not tested.     ASSESSMENT/PLAN [name] is a 89 y.o. female with history of A-fib on Xarelto, pacemaker, recent COVID infection, HF PEF, hypertension, PE 2019, SVT, stroke admitted for left-sided weakness and aphasia. No TNK given due to on Xarelto.    Stroke: right MCA and cerebellar infarcts right M1 and ACA occlusions/p IR with TICI3 and TICI2c respectively, embolic secondary to A-fib even on Xarelto CT no acute abnormality CT head and neck right M1 occlusion with distal  reconstitution IR showed right MCA and ACA occlusion status post TICI3 MCA and TICI2c ACA. MRI acute infarcts in the right insula, right frontal lobe and cerebellum bilaterally MRA right M1 MCA now patent 2D Echo pending LDL 143 HgbA1c 6.2 UDS negative Lovenox for VTE prophylaxis Xarelto (rivaroxaban) daily prior to admission, now on aspirin 300 mg suppository daily.  Consider Eliquis once p.o. access Ongoing aggressive stroke risk factor management Therapy recommendations: CIR Disposition: Pending  A-fib On Xarelto PTA Currently on aspirin Rate controlled Consider Eliquis when p.o. access  History of stroke 10/2020 admitted for left CR infarct with left ICA and MCA occlusion status post IR with TICI3.  CT head and neck T occlusion with recon at left MCA and ACA.  EF 40 to 45%, LDL 81, A1c 5.8.  Continue on Xarelto but recommend taking Xarelto with food  Respiratory failure Intubated for procedure Remained intubated postprocedure Plan to extubate today Management per CCM  Hypertension Stable Off Cleviprex Long term BP goal normotensive  Hyperlipidemia Home meds: None LDL 143, goal < 70 Statin allergy Will start Zetia once p.o. access May consider Leqvio  Dysphagia Did not pass swallow Currently n.p.o. On IV fluid May consider tube feeding if not passing swallow  Other Stroke Risk Factors Advanced age SVT PE in 2019 HFpEF  Other Active Problems Pacemaker present History of COVID infection AKI, creatinine 1.40  Hospital day # 0  This patient is critically ill due to stroke status post thrombectomy, A-fib, respiratory failure and at significant risk of neurological worsening, death form recurrent stroke, hemorrhagic transformation, heart failure. This patient's care requires constant monitoring of vital signs, hemodynamics, respiratory and cardiac monitoring, review of multiple databases, neurological assessment, discussion with family, other specialists and  medical decision making of high complexity. I spent 40 minutes of neurocritical care time in the care of this patient. I had long discussion with daughter at bedside, updated pt current condition, treatment plan and potential prognosis, and answered all the questions.  She expressed understanding and appreciation.  I also discussed with Dr. Everardo All CCM.   Marvel Plan, MD PhD Stroke Neurology 04/19/2023 11:33 AM    To contact Stroke Continuity provider, please refer to [email or web address]. After hours, contact General Neurology

## 2023-04-19 NOTE — ED Triage Notes (Signed)
 Pt arrived from home with EMS for code stroke. OnEliquis, hx of CVA. Per report, LKW [id number]; pt was attempting to ambulate to living room and had notable left side flaccidity and aphasia. EMS VS 169/97, placed on 4L [place] for comfort. On ER arrival, left sided weakness noted, started speaking while in CT. Currently denies pain

## 2023-04-19 NOTE — Code Documentation (Signed)
 Responded to Code Stroke called at 0138 for L sided paralysis and aphasia, [id number]. Pt arrived at 0202, CBG-131, NIH-13, CT head negative for acute changes. CTA-R M1 occlusion. TNK not given-pt on eliquis. IR paged out at 0256. Pt transported to IR suite at 0300. Plan ICU admission.

## 2023-04-19 NOTE — Telephone Encounter (Signed)
 Returned call to [name] (patient's daughter) and she says she is not doing well, as the patient had a major stroke and she is not admitted at [facility] in ICU. Patient just came out of surgery. Patient daughter says they have been at 2:30 this morning. Patient daughter says she will be cancelling her lab and follow up on Monday with provider, as she cannot handle at this moment.

## 2023-04-19 NOTE — ED Notes (Signed)
 Family at bedside; decision made for IR by family

## 2023-04-19 NOTE — Consult Note (Signed)
 NAME:  Frances Kent, MRN:  [id number], DOB:  October 28, 1930, LOS: 0 ADMISSION DATE:  04/19/2023, CONSULTATION DATE: 3/5 REFERRING MD: Dr. Otelia Limes, CHIEF COMPLAINT: Stroke  History of Present Illness:  Patient is encephalopathic and/or intubated; therefore, history has been obtained from chart review.  89 year old female with past medical history as below, which is significant for atrial fibrillation on Xarelto, pacemaker, HFrEF, hypertension, hypothyroidism, and prior stroke with residual right-sided weakness.  She presented to [facility] emergency department on 3/5 with complaints of aphasia and left-sided weakness.  Code stroke was called and she was sent emergently for imaging.  Last known well at 2200 on 3/4.  CTA of the head showed occlusion versus critical stenosis of the right M1 MCA.  She was not a TNK candidate due to Xarelto.  She was taken to IR for mechanical aspiration of clot and received a TICI 2c result.  Postoperatively she was transferred to the neuro ICU and PCCM was asked to assist with ventilator management.  Of note she does carry a diagnosis of failure to thrive in the outpatient setting and her PCP is recently referred her to palliative care for many of her chronic issues.  Pertinent  Medical History   has a past medical history of Arthritis, Atrial fibrillation (HCC), Benign breast cyst in female, left (10/07/2016), Candida esophagitis (HCC) (05/03/2021), CAP (community acquired pneumonia) (02/15/2021), CHF (congestive heart failure) (HCC), Colon adenomas, GERD (gastroesophageal reflux disease), HCAP (healthcare-associated pneumonia) (02/02/2021), Hypertension, Hypothyroidism, Macular degeneration, Mitral regurgitation, Pulmonary embolism (HCC) (02/2016), Severe sepsis (HCC) (02/02/2021), Stroke (HCC) (10/2020), and SVT (supraventricular tachycardia) (HCC).   Significant Hospital Events: Including procedures, antibiotic start and stop dates in addition to other pertinent  events     Interim History / Subjective:    Objective   Blood pressure 137/64, pulse (!) 51, temperature (!) 95.8 F (35.4 C), temperature source Rectal, resp. rate 16, height 5\' 3"  (1.6 m), weight 47.6 kg, SpO2 100%.    Vent Mode: PRVC FiO2 (%):  [40 %-60 %] 40 % Set Rate:  [12 bmp-16 bmp] 12 bmp Vt Set:  [420 mL] 420 mL PEEP:  [5 cmH20] 5 cmH20 Plateau Pressure:  [16 cmH20] 16 cmH20   Intake/Output Summary (Last 24 hours) at 04/19/2023 4401 Last data filed at 04/19/2023 0533 Gross per 24 hour  Intake 500 ml  Output 5 ml  Net 495 ml   Filed Weights   04/19/23 0200  Weight: 47.6 kg    Examination: General: Elderly female in no acute distress on the ventilator HENT: Normocephalic, atraumatic, PERRL Lungs: Clear bilateral breath sounds Cardiovascular: Irregularly irregular no MRG Abdomen: Soft, nontender, nondistended Extremities: No acute deformity range Neuro: She does arouse and nods yes that she can hear me.  She is able to follow commands on the left and right upper extremities weakly.  She wiggles toes the left but does not on the right.   Resolved Hospital Problem list     Assessment & Plan:   CVA: Right M1 segment status post IR revascularization. Prior stroke with residual right-sided weakness per RN report -Keep systolic blood pressure between 120 and 140 -Management otherwise per stroke service - Echo -Cleviprex -MRI if pacemaker is compatible.   Endotracheal tube present in the postoperative setting -Full vent support -ABG reviewed -CXR pending -Hopefully will be able to tolerate spontaneous breathing trial later today -Propofol infusion and as needed fentanyl for RASS goal -1 to -2  Atrial fibrillation: Chronic HFrEF: Most recent echo with EF 40  to 45%. Essential hypertension -Telemetry monitoring -Holding anticoagulation for now -Looks to be euvolemic on exam. -Holding home furosemide and metoprolol while needing tight BP  control  Hypothyroid -Synthroid per tube  CKD stage IIIb: Creatinine is near baseline -Trend chemistry  Failure to thrive Moderate malnutrition -Outpatient referral to palliative care was pending -Have not been able to engage family this morning, request day team to follow-up.  Best Practice (right click and "Reselect all SmartList Selections" daily)   Diet/type: NPO DVT prophylaxis SCD Pressure ulcer(s): N/A GI prophylaxis: H2B Lines: N/A Foley:  N/A Code Status:  full code Last date of multidisciplinary goals of care discussion [ ]   Labs   CBC: Recent Labs  Lab 04/19/23 0208 04/19/23 0209 04/19/23 0606  WBC 6.1  --   --   NEUTROABS 3.0  --   --   HGB 13.5 14.6 11.9*  HCT 42.2 43.0 35.0*  MCV 90.8  --   --   PLT 210  --   --     Basic Metabolic Panel: Recent Labs  Lab 04/14/23 1421 04/19/23 0208 04/19/23 0209 04/19/23 0606  NA 142 138 139 137  K 4.6 3.8 3.7 3.5  CL 100 103 105  --   CO2 22 23  --   --   GLUCOSE 99 133* 131*  --   BUN 28 18 20   --   CREATININE 1.78* 1.33* 1.40*  --   CALCIUM 9.6 9.3  --   --    GFR: Estimated Creatinine Clearance: 20.1 mL/min (A) (by C-G formula based on SCr of 1.4 mg/dL (H)). Recent Labs  Lab 04/19/23 0208  WBC 6.1    Liver Function Tests: Recent Labs  Lab 04/19/23 0208  AST 26  ALT 15  ALKPHOS 57  BILITOT 0.5  PROT 6.7  ALBUMIN 3.6   No results for input(s): "LIPASE", "AMYLASE" in the last 168 hours. No results for input(s): "AMMONIA" in the last 168 hours.  ABG    Component Value Date/Time   PHART 7.505 (H) 04/19/2023 0606   PCO2ART 29.3 (L) 04/19/2023 0606   PO2ART 268 (H) 04/19/2023 0606   HCO3 23.1 04/19/2023 0606   TCO2 24 04/19/2023 0606   ACIDBASEDEF 6.4 (H) 02/02/2021 0932   O2SAT 100 04/19/2023 0606     Coagulation Profile: Recent Labs  Lab 04/19/23 0208 04/19/23 0421  INR 4.4* 4.9*    Cardiac Enzymes: No results for input(s): "CKTOTAL", "CKMB", "CKMBINDEX", "TROPONINI" in  the last 168 hours.  HbA1C: Hgb A1c MFr Bld  Date/Time Value Ref Range Status  04/19/2023 04:34 AM 6.2 (H) 4.8 - 5.6 % Final    Comment:    (NOTE) Pre diabetes:          5.7%-6.4%  Diabetes:              >6.4%  Glycemic control for   <7.0% adults with diabetes   11/02/2020 05:09 AM 5.8 (H) 4.8 - 5.6 % Final    Comment:    (NOTE) Pre diabetes:          5.7%-6.4%  Diabetes:              >6.4%  Glycemic control for   <7.0% adults with diabetes     CBG: No results for input(s): "GLUCAP" in the last 168 hours.  Review of Systems:   Patient is encephalopathic and/or intubated; therefore, history has been obtained from chart review.    Past Medical History:  She,  has a  past medical history of Arthritis, Atrial fibrillation (HCC), Benign breast cyst in female, left (10/07/2016), Candida esophagitis (HCC) (05/03/2021), CAP (community acquired pneumonia) (02/15/2021), CHF (congestive heart failure) (HCC), Colon adenomas, GERD (gastroesophageal reflux disease), HCAP (healthcare-associated pneumonia) (02/02/2021), Hypertension, Hypothyroidism, Macular degeneration, Mitral regurgitation, Pulmonary embolism (HCC) (02/2016), Severe sepsis (HCC) (02/02/2021), Stroke (HCC) (10/2020), and SVT (supraventricular tachycardia) (HCC).   Surgical History:   Past Surgical History:  Procedure Laterality Date   APPENDECTOMY  1960   BREAST CYST ASPIRATION Left 2017   CARDIOVERSION N/A 04/05/2016   Procedure: Cardioversion;  Surgeon: Lamar Blinks, MD;  Location: [facility] ORS;  Service: Cardiovascular;  Laterality: N/A;   CHOLECYSTECTOMY  1985   ESOPHAGOGASTRODUODENOSCOPY N/A 03/10/2021   Procedure: ESOPHAGOGASTRODUODENOSCOPY (EGD);  Surgeon: Toledo, Boykin Nearing, MD;  Location: [facility] ENDOSCOPY;  Service: Gastroenterology;  Laterality: N/A;   ESOPHAGOGASTRODUODENOSCOPY (EGD) WITH PROPOFOL N/A 11/27/2019   Procedure: ESOPHAGOGASTRODUODENOSCOPY (EGD) WITH PROPOFOL;  Surgeon: Regis Bill, MD;   Location: [facility] ENDOSCOPY;  Service: Endoscopy;  Laterality: N/A;   ESOPHAGOGASTRODUODENOSCOPY (EGD) WITH PROPOFOL N/A 12/24/2019   Procedure: ESOPHAGOGASTRODUODENOSCOPY (EGD) WITH PROPOFOL;  Surgeon: Regis Bill, MD;  Location: [facility] ENDOSCOPY;  Service: Endoscopy;  Laterality: N/A;   IR CT HEAD LTD  11/01/2020   IR PERCUTANEOUS ART THROMBECTOMY/INFUSION INTRACRANIAL INC DIAG ANGIO  11/01/2020   IR US GUIDE VASC ACCESS RIGHT  11/02/2020   OVARIAN CYST REMOVAL     PACEMAKER LEADLESS INSERTION N/A 11/16/2021   Procedure: PACEMAKER LEADLESS INSERTION;  Surgeon: Marcina Millard, MD;  Location: [facility] INVASIVE CV LAB;  Service: Cardiovascular;  Laterality: N/A;   RADIOLOGY WITH ANESTHESIA N/A 11/01/2020   Procedure: RADIOLOGY WITH ANESTHESIA;  Surgeon: Julieanne Cotton, MD;  Location: [facility] OR;  Service: Radiology;  Laterality: N/A;   TEE WITHOUT CARDIOVERSION N/A 02/01/2018   Procedure: TRANSESOPHAGEAL ECHOCARDIOGRAM (TEE);  Surgeon: Lamar Blinks, MD;  Location: [facility] ORS;  Service: Cardiovascular;  Laterality: N/A;     Social History:   reports that she has never smoked. She has never used smokeless tobacco. She reports that she does not drink alcohol and does not use drugs.   Family History:  Her family history includes Breast cancer (age of onset: 39) in her mother; Cancer (age of onset: 36) in her son; Cancer (age of onset: 45) in her mother; Heart disease in her son; Heart disease (age of onset: 56) in her maternal grandmother; Stroke in her sister.   Allergies Allergies  Allergen Reactions   Statins Hives    Severe myalgias     Home Medications  Prior to Admission medications   Medication Sig Start Date End Date Taking? Authorizing Provider  acetaminophen (TYLENOL) 500 MG tablet Take 500 mg by mouth every 6 (six) hours as needed.    [provider]  furosemide (LASIX) 20 MG tablet Take 1 tablet (20 mg total) by mouth daily. 04/12/23   Sherlene Shams, MD   levalbuterol (XOPENEX) 0.63 MG/3ML nebulizer solution USE 1  VIAL IN NEBULIZER EVERY 4 HOURS AS NEEDED FOR WHEEZING AND FOR SHORTNESS OF BREATH 12/12/22   Salena Saner, MD  levothyroxine (SYNTHROID) 50 MCG tablet Take 1 tablet (50 mcg total) by mouth daily before breakfast. 03/22/23   Sherlene Shams, MD  metoprolol succinate (TOPROL-XL) 25 MG 24 hr tablet Take 1 tablet (25 mg total) by mouth 2 (two) times daily. Take with or immediately following a meal. 04/12/23   Sherlene Shams, MD  pantoprazole (PROTONIX) 20 MG tablet Take 1 tablet (20  mg total) by mouth daily. 04/12/23   Sherlene Shams, MD  potassium chloride SA (KLOR-CON M) 20 MEQ tablet Take 1 tablet (20 mEq total) by mouth daily. 04/01/23   Sherlene Shams, MD  Probiotic CHEW Chew 2 tablets by mouth daily.    [provider]  traZODone (DESYREL) 50 MG tablet Take 0.5-1 tablets (25-50 mg total) by mouth at bedtime as needed for sleep. 01/06/23   Sherlene Shams, MD  XARELTO 15 MG TABS tablet TAKE 1 TABLET EVERY DAY 03/10/23   Sherlene Shams, MD     Critical care time: 37 minutes     Joneen Roach, AGACNP-BC [facility]  See Amion for personal pager PCCM on call pager [phone] until 7pm. Please call Elink 7p-7a. [phone]  04/19/2023 6:53 AM

## 2023-04-19 NOTE — Anesthesia Procedure Notes (Signed)
 Procedure Name: Intubation Date/Time: 04/19/2023 3:13 AM  Performed by: Rachel Moulds, CRNAPre-anesthesia Checklist: Patient identified, Emergency Drugs available, Suction available, Patient being monitored and Timeout performed Patient Re-evaluated:Patient Re-evaluated prior to induction Oxygen Delivery Method: Circle system utilized Preoxygenation: Pre-oxygenation with 100% oxygen Induction Type: IV induction, Rapid sequence and Cricoid Pressure applied Grade View: Grade II Tube type: Oral Tube size: 7.0 mm Number of attempts: 1 Airway Equipment and Method: Stylet Placement Confirmation: ETT inserted through vocal cords under direct vision, positive ETCO2, CO2 detector and breath sounds checked- equal and bilateral Secured at: 21 cm Tube secured with: Tape Dental Injury: Teeth and Oropharynx as per pre-operative assessment

## 2023-04-19 NOTE — TOC Initial Note (Signed)
 Transition of Care [facility]) - Initial/Assessment Note    Patient Details  Name: Frances Kent MRN: [id number] Date of Birth: April 15, 1930  [facility]) CM/SW Contact:    Lamonte Sakai, Student-Social Work Phone Number: 04/19/2023, 9:44 AM  Clinical Narrative:                  Pt admitted from home with spouse due to stroke. Pt currently intubated. TOC following for medical progression.       Patient Goals and CMS Choice            Expected Discharge Plan and Services       Living arrangements for the past 2 months: Single Family Home                                      Prior Living Arrangements/Services Living arrangements for the past 2 months: Single Family Home                     Activities of Daily Living      Permission Sought/Granted                  Emotional Assessment   Attitude/Demeanor/Rapport: Intubated (Following Commands or Not Following Commands) Affect (typically observed): Unable to Assess Orientation: :  (Intubated)      Admission diagnosis:  Stroke (cerebrum) (HCC) [I63.9] Cerebrovascular accident (CVA) due to thrombosis of right middle cerebral artery (HCC) [I63.311] Middle cerebral artery embolism, right [I66.01] Patient Active Problem List   Diagnosis Date Noted   Stroke (cerebrum) (HCC) 04/19/2023   Middle cerebral artery embolism, right 04/19/2023   Failure to thrive in adult 04/13/2023   Acute on chronic systolic CHF (congestive heart failure) (HCC) 04/04/2023   COVID-19 virus infection 03/23/2023   Counseling regarding goals of care 02/23/2023   Bilateral lower extremity edema 02/12/2023   Lump in neck 02/12/2023   Hyperkalemia 12/19/2022   Generalized weakness 11/26/2022   Pacemaker 07/06/2022   Eczema of eyelid 03/25/2022   Psoriasis of scalp 03/25/2022   Essential tremor 11/23/2021   Vertebral fracture, osteoporotic, sequela 02/23/2021   Depression with anxiety 02/02/2021   History of  embolic stroke 12/08/2020   Inflamed external hemorrhoid 12/08/2020   Insomnia due to anxiety and fear 12/08/2020   Long term (current) use of anticoagulants    Chronic HFrEF (heart failure with reduced ejection fraction) (HCC)    Malnutrition of moderate degree (HCC) 11/03/2020   Endotracheal tube present    Prolonged QT interval    Chronic atrial fibrillation with RVR (HCC) 10/16/2020   Antibiotic-associated diarrhea 10/15/2020   ILD (interstitial lung disease) (HCC) 09/22/2020   Atrial flutter, paroxysmal (HCC) 09/10/2020   Sick sinus syndrome (HCC) 09/08/2020   Elevated serum free T4 level 09/08/2020   Purpura senilis (HCC) 08/28/2020   Hiatal hernia 08/09/2020   Acquired thrombophilia (HCC) 08/06/2020   Elevated troponin I level 05/21/2020   CVA (cerebral vascular accident) (HCC) 03/24/2020   History of COVID-19 03/03/2020   Bilateral carotid artery stenosis 12/23/2019   Schatzki's ring of distal esophagus 12/11/2019   Erosive gastritis 11/30/2019   Dysphagia 11/27/2019   Painful swallowing 11/27/2019   Gastroesophageal reflux disease 11/27/2019   Hypothyroidism 04/11/2019   Chronic kidney disease, stage 3b (HCC) 04/11/2019   Essential hypertension 09/12/2018   Hypothyroidism due to acquired atrophy of thyroid 05/08/2018   Unintentional weight  loss 05/08/2018   Myalgia due to statin 05/08/2018   Abdominal aortic atherosclerosis (HCC) 05/07/2018   Bradycardia 04/28/2018   Pleural effusion on left 02/27/2018   Moderate mitral stenosis 01/10/2018   Lumbar radiculitis 01/07/2018   B12 deficiency 07/26/2017   Hypotension 04/06/2016   Vitamin D deficiency 04/08/2015   Cervical spine degeneration 12/13/2014   History of pulmonary embolism 12/02/2014   Insomnia 10/04/2014   Moderate tricuspid insufficiency 09/17/2014   Atrial fibrillation (HCC) 04/03/2014   Generalized anxiety disorder 03/28/2013   Mitral valve prolapse 03/28/2013   Routine adult health maintenance  03/23/2012   Fatigue 03/23/2012   Cough 04/17/2011   Screening for breast cancer 11/29/2010   Macular degeneration, left eye 11/29/2010   Screening for colon cancer 11/29/2010   Hyperlipidemia LDL goal <70 11/29/2010   GERD (gastroesophageal reflux disease)    PCP:  Sherlene Shams, MD Pharmacy:   [facility] [address], [place] - [address] [place] [address] Phone: [phone] Fax: [phone]  [facility] - [place], [place] - [address] [place] [address] Phone: [phone] Fax: [phone]  [id number] [place], [place] - [address] AT [facility] [address] [place] [address] Phone: [phone] Fax: [phone]  [facility] [address] [place] [address] Phone: [phone] Fax: [phone]     Social Drivers of Health (SDOH) Social History: SDOH Screenings   Food Insecurity: No Food Insecurity (04/04/2023)  Housing: Unknown (04/12/2023)   Received from [facility]  Transportation Needs: No Transportation Needs (04/04/2023)  Utilities: Not At Risk (04/04/2023)  Depression (PHQ2-9): Low Risk  (03/22/2023)  Recent Concern: Depression (PHQ2-9) - Medium Risk (02/23/2023)  Financial Resource Strain: Low Risk  (02/21/2023)  Physical Activity: Unknown (02/21/2023)  Social Connections: Socially Isolated (04/04/2023)  Stress: Stress Concern Present (02/21/2023)  Tobacco Use: Low Risk  (04/19/2023)   SDOH Interventions:     Readmission Risk Interventions    02/16/2021    3:33 PM 01/14/2021   10:39 AM 11/01/2020   10:42 AM  Readmission Risk Prevention Plan  Transportation Screening Complete Complete Complete  PCP or Specialist Appt within 5-7 Days   Complete  Medication Review (RN CM)   Complete  Medication Review Oceanographer) Complete Complete   PCP or Specialist appointment  within 3-5 days of discharge  Complete   HRI or Home Care Consult Complete Complete   SW Recovery Care/Counseling Consult Complete Complete   Palliative Care Screening Not Applicable Not Applicable   Skilled Nursing Facility  Not Applicable

## 2023-04-19 NOTE — Evaluation (Signed)
 Physical Therapy Evaluation Patient Details Name: Frances Kent MRN: [id number] DOB: 11/22/1930 Today's Date: 04/19/2023  History of Present Illness  89 y.o. female presents to [facility] hospital on 04/19/2023 with aphasia and L weakness. CTA with critical stenosis of R M1 MCA. Pt underwent mechanical thrombectomy, remaining intubated post-procedure. Extubated 3/5. PMH includes afib, PPM, HFrEF, HTN, hypothyroidism, prior CVA with residual R weakness.  Clinical Impression  Pt presents to PT with deficits in strength, power, gait, balance, functional mobility, endurance. Pt demonstrates weakness globally but with more significant focal weakness of L side at this time. Pt with a tendency for L lateral lean throughout session, is able to correct with verbal and tactile cues but otherwise returns to left side. Pt requires significant assistance to step and pivot from bed to recliner due to weakness and impaired balance. Patient will benefit from intensive inpatient follow-up therapy, >3 hours/day.        If plan is discharge home, recommend the following: Two people to help with walking and/or transfers;Two people to help with bathing/dressing/bathroom;Assistance with cooking/housework;Direct supervision/assist for medications management;Direct supervision/assist for financial management;Assist for transportation;Help with stairs or ramp for entrance   Can travel by private vehicle        Equipment Recommendations Wheelchair (measurements PT);Wheelchair cushion (measurements PT);Hospital bed  Recommendations for Other Services  Rehab consult    Functional Status Assessment Patient has had a recent decline in their functional status and demonstrates the ability to make significant improvements in function in a reasonable and predictable amount of time.     Precautions / Restrictions Precautions Precautions: Fall Restrictions Weight Bearing Restrictions Per Provider Order: No      Mobility  Bed  Mobility Overal bed mobility: Needs Assistance Bed Mobility: Supine to Sit, Rolling Rolling: Min assist   Supine to sit: Mod assist     General bed mobility comments: cues for sequencing along with physical assist at trunk and via bed pad    Transfers Overall transfer level: Needs assistance Equipment used: 2 person hand held assist Transfers: Sit to/from Stand Sit to Stand: Mod assist, +2 physical assistance   Step pivot transfers: Mod assist, +2 physical assistance       General transfer comment: assist via BUE hand hold along with faciliation of weight shift at trunk to aide in guidance of stepping    Ambulation/Gait                  Stairs            Wheelchair Mobility     Tilt Bed    Modified Rankin (Stroke Patients Only) Modified Rankin (Stroke Patients Only) Pre-Morbid Rankin Score: Moderate disability Modified Rankin: Severe disability     Balance Overall balance assessment: Needs assistance Sitting-balance support: Single extremity supported, Feet unsupported Sitting balance-Leahy Scale: Poor   Postural control: Left lateral lean Standing balance support: Bilateral upper extremity supported Standing balance-Leahy Scale: Poor Standing balance comment: modA                             Pertinent Vitals/Pain Pain Assessment Pain Assessment: No/denies pain    Home Living Family/patient expects to be discharged to:: Private residence Living Arrangements: Alone Available Help at Discharge: Family Type of Home: House Home Access: Level entry (front entrance with 1STE)       Home Layout: One level;Laundry or work area in [employer] Equipment: Agricultural consultant (2 wheels);Shower seat;BSC/3in1;Toilet riser;Grab bars -  toilet;Lift chair Additional Comments: has x2 dogs great great daughters on property that daugther cares for. pt has no animal care needs. pt gets all meals provided by daughters that live on the same price of  land. one daughter had pins placed in knee in less than a week ago so will have limited physcial (A) for the patient    Prior Function Prior Level of Function : Needs assist             Mobility Comments: ambulatory with RW ADLs Comments: sponge bathing, assist for IADLs     Extremity/Trunk Assessment   Upper Extremity Assessment Upper Extremity Assessment: Right hand dominant;RUE deficits/detail;LUE deficits/detail RUE Deficits / Details: baseline tremor, prior decreased coordination of R hand after stroke 3 years ago the patietn reports. pt holds digits in claw position but can fully extend with cues and time. No prior splint wear LUE Deficits / Details: baseline tremor noted    Lower Extremity Assessment Lower Extremity Assessment: Defer to PT evaluation RLE Deficits / Details: RLE grossly 4-/5 LLE Deficits / Details: LLE grossly 3+/5    Cervical / Trunk Assessment Cervical / Trunk Assessment: Kyphotic  Communication   Communication Communication: No apparent difficulties    Cognition Arousal: Alert Behavior During Therapy: WFL for tasks assessed/performed   PT - Cognitive impairments: Problem solving, Awareness                         Following commands: Intact       Cueing Cueing Techniques: Verbal cues     General Comments General comments (skin integrity, edema, etc.): VSS on RA    Exercises     Assessment/Plan    PT Assessment Patient needs continued PT services  PT Problem List Decreased strength;Decreased activity tolerance;Decreased balance;Decreased mobility;Decreased knowledge of use of DME;Decreased safety awareness;Decreased knowledge of precautions       PT Treatment Interventions DME instruction;Gait training;Stair training;Functional mobility training;Therapeutic activities;Therapeutic exercise;Neuromuscular re-education;Balance training;Cognitive remediation;Patient/family education;Wheelchair mobility training    PT Goals  (Current goals can be found in the Care Plan section)  Acute Rehab PT Goals Patient Stated Goal: to return to walking PT Goal Formulation: With patient/family Time For Goal Achievement: 05/03/23 Potential to Achieve Goals: Good    Frequency Min 3X/week     Co-evaluation PT/OT/SLP Co-Evaluation/Treatment: Yes Reason for Co-Treatment: For patient/therapist safety;To address functional/ADL transfers PT goals addressed during session: Mobility/safety with mobility;Balance;Strengthening/ROM OT goals addressed during session: ADL's and self-care;Proper use of Adaptive equipment and DME       AM-PAC PT "6 Clicks" Mobility  Outcome Measure Help needed turning from your back to your side while in a flat bed without using bedrails?: A Little Help needed moving from lying on your back to sitting on the side of a flat bed without using bedrails?: A Lot Help needed moving to and from a bed to a chair (including a wheelchair)?: A Lot Help needed standing up from a chair using your arms (e.g., wheelchair or bedside chair)?: A Lot Help needed to walk in hospital room?: Total Help needed climbing 3-5 steps with a railing? : Total 6 Click Score: 11    End of Session Equipment Utilized During Treatment: Gait belt Activity Tolerance: Patient tolerated treatment well Patient left: in chair;with call bell/phone within reach;with chair alarm set Nurse Communication: Mobility status PT Visit Diagnosis: Other abnormalities of gait and mobility (R26.89);Muscle weakness (generalized) (M62.81)    Time: 1610-9604 PT Time Calculation (min) (ACUTE ONLY):  29 min   Charges:   PT Evaluation $PT Eval Moderate Complexity: 1 Mod   PT General Charges $$ ACUTE PT VISIT: 1 Visit         Arlyss Gandy, PT, DPT Acute Rehabilitation Office [phone]   Arlyss Gandy 04/19/2023, 4:38 PM

## 2023-04-19 NOTE — Transfer of Care (Signed)
 Immediate Anesthesia Transfer of Care Note  Patient: Frances Kent  Procedure(s) Performed: RADIOLOGY WITH ANESTHESIA  Patient Location: ICU  Anesthesia Type:General  Level of Consciousness: Patient remains intubated per anesthesia plan  Airway & Oxygen Therapy: Patient remains intubated per anesthesia plan and Patient placed on Ventilator (see vital sign flow sheet for setting)  Post-op Assessment: Report given to RN and Post -op Vital signs reviewed and stable  Post vital signs: Reviewed and stable  Last Vitals:  Vitals Value Taken Time  BP 141/78   Temp    Pulse 63 04/19/23 0535  Resp 24 04/19/23 0535  SpO2 100 % 04/19/23 0535  Vitals shown include unfiled device data.  Last Pain:  Vitals:   04/19/23 0249  TempSrc: Rectal  PainSc:          Complications: No notable events documented.

## 2023-04-19 NOTE — Anesthesia Postprocedure Evaluation (Signed)
 Anesthesia Post Note  Patient: Frances Kent  Procedure(s) Performed: RADIOLOGY WITH ANESTHESIA     Patient location during evaluation: SICU Anesthesia Type: General Level of consciousness: sedated Pain management: pain level controlled Vital Signs Assessment: post-procedure vital signs reviewed and stable Respiratory status: patient remains intubated per anesthesia plan Cardiovascular status: stable Postop Assessment: no apparent nausea or vomiting Anesthetic complications: no   No notable events documented.  Last Vitals:  Vitals:   04/19/23 0540 04/19/23 0545  BP: (!) 156/78 137/64  Pulse: (!) 58 (!) 51  Resp: 16 16  Temp:    SpO2: 100% 100%    Last Pain:  Vitals:   04/19/23 0249  TempSrc: Rectal  PainSc:                  Frances Kent,W. EDMOND

## 2023-04-19 NOTE — H&P (Addendum)
 NEUROLOGY ADMISSION HISTORY AND PHYSICAL   Date of service: April 19, 2023 Patient Name: Frances Kent MRN:  [id number] DOB:  [birth year] Chief Complaint: Aphasia and left sided weakness Requesting Provider: Arletha Pili, DO  History of Present Illness  Frances Kent is a 89 y.o. female with a PMHx of atrial fibrillation (on Xarelto), pacemaker, arthritis, recent Covid infection, HFrEF, GERD, HTN, hypothyroidism, macular degeneration, mitral regurgitation, PE (2019), prior stroke without significant residual deficits and SVT who presents from home to the ED via EMS as a Code Stroke for acute onset of aphasia and left sided weakness. Per family communication with EMS, the patient was LKN at 2220 when she was walking to a recliner. She got into the recliner and then was noted by family to be aphasic with left sided weakness. When EMS arrived they found same. EMS VS 169/97, placed on 4L [place] for comfort. CBG 131 on arrival.   LKW: 2220 Modified rankin score: 1-No significant post stroke disability and can perform usual duties with stroke symptoms IV Thrombolysis: No: On Xarelto EVT: Yes   NIHSS components Score: Comment  1a Level of Conscious 0[]  1[x]  2[]  3[]      1b LOC Questions 0[x]  1[]  2[]       1c LOC Commands 0[x]  1[]  2[]       2 Best Gaze 0[]  1[x]  2[]       3 Visual 0[x]  1[]  2[]  3[]      4 Facial Palsy 0[]  1[x]  2[]  3[]      5a Motor Arm - left 0[]  1[]  2[]  3[]  4[x]  UN[]    5b Motor Arm - Right 0[x]  1[]  2[]  3[]  4[]  UN[]    6a Motor Leg - Left 0[]  1[]  2[]  3[x]  4[]  UN[]    6b Motor Leg - Right 0[x]  1[]  2[]  3[]  4[]  UN[]    7 Limb Ataxia 0[x]  1[]  2[]  3[]  UN[]     8 Sensory 0[x]  1[]  2[]  UN[]      9 Best Language 0[]  1[]  2[x]  3[]      10 Dysarthria 0[]  1[x]  2[]  UN[]      11 Extinct. and Inattention 0[x]  1[]  2[]       TOTAL:   13      ROS  Unable to ascertain due to aphasia.   Past History   Past Medical History:  Diagnosis Date   Arthritis    Atrial fibrillation (HCC)    Benign breast  cyst in female, left 10/07/2016   Candida esophagitis (HCC) 05/03/2021   Found on Mar 10 2021 EGD    CAP (community acquired pneumonia) 02/15/2021   CHF (congestive heart failure) (HCC)    Colon adenomas    GERD (gastroesophageal reflux disease)    HCAP (healthcare-associated pneumonia) 02/02/2021   Hypertension    Hypothyroidism    Macular degeneration    Mitral regurgitation    Pulmonary embolism (HCC) 02/2016   Severe sepsis (HCC) 02/02/2021   Stroke (HCC) 10/2020   SVT (supraventricular tachycardia) (HCC)     Past Surgical History:  Procedure Laterality Date   APPENDECTOMY  1960   BREAST CYST ASPIRATION Left 2017   CARDIOVERSION N/A 04/05/2016   Procedure: Cardioversion;  Surgeon: Lamar Blinks, MD;  Location: [facility] ORS;  Service: Cardiovascular;  Laterality: N/A;   CHOLECYSTECTOMY  1985   ESOPHAGOGASTRODUODENOSCOPY N/A 03/10/2021   Procedure: ESOPHAGOGASTRODUODENOSCOPY (EGD);  Surgeon: Toledo, Boykin Nearing, MD;  Location: [facility] END[facility];  Service: Gastroenterology;  Laterality: N/A;   ESOPHAGOGASTRODUODENOSCOPY (EGD) WITH PROPOFOL N/A 11/27/2019   Procedure: ESOPHAGOGASTRODUODENOSCOPY (EGD) WITH PROPOFOL;  Surgeon: Mia Creek,  Rossie Muskrat, MD;  Location: [facility] ENDOS[facility];  Service: Endoscopy;  Laterality: N/A;   ESOPHAGOGASTRODUODENOSCOPY (EGD) WITH PROPOFOL N/A 12/24/2019   Procedure: ESOPHAGOGASTRODUODENOSCOPY (EGD) WITH PROPOFOL;  Surgeon: Regis Bill, MD;  Location: [facility] ENDOSCOPY;  Service: Endoscopy;  Laterality: N/A;   IR CT HEAD LTD  11/01/2020   IR PERCUTANEOUS ART THROMBECTOMY/INFUSION INTRACRANIAL INC DIAG ANGIO  11/01/2020   IR US GUIDE VASC ACCESS RIGHT  11/02/2020   OVARIAN CYST REMOVAL     PACEMAKER LEADLESS INSERTION N/A 11/16/2021   Procedure: PACEMAKER LEADLESS INSERTION;  Surgeon: Marcina Millard, MD;  Location: [facility] INVASIVE CV LAB;  Service: Cardiovascular;  Laterality: N/A;   RADIOLOGY WITH ANESTHESIA N/A 11/01/2020   Procedure: RADIOLOGY WITH  ANESTHESIA;  Surgeon: Julieanne Cotton, MD;  Location: [facility] OR;  Service: Radiology;  Laterality: N/A;   TEE WITHOUT CARDIOVERSION N/A 02/01/2018   Procedure: TRANSESOPHAGEAL ECHOCARDIOGRAM (TEE);  Surgeon: Lamar Blinks, MD;  Location: [facility] ORS;  Service: Cardiovascular;  Laterality: N/A;    Family History: Family History  Problem Relation Age of Onset   Cancer Mother 55       breast cancer, lived to 5,    Breast cancer Mother 71   Cancer Son 7       pancreatic cancer   Heart disease Son        CAD, Tobacco Abuse    Heart disease Maternal Grandmother 23       died of massive MI   Stroke Sister     Social History  reports that she has never smoked. She has never used smokeless tobacco. She reports that she does not drink alcohol and does not use drugs.  Allergies  Allergen Reactions   Statins Hives    Severe myalgias    Medications  No current facility-administered medications for this encounter.  Current Outpatient Medications:    acetaminophen (TYLENOL) 500 MG tablet, Take 500 mg by mouth every 6 (six) hours as needed., Disp: , Rfl:    furosemide (LASIX) 20 MG tablet, Take 1 tablet (20 mg total) by mouth daily., Disp: 30 tablet, Rfl: 3   levalbuterol (XOPENEX) 0.63 MG/3ML nebulizer solution, USE 1  VIAL IN NEBULIZER EVERY 4 HOURS AS NEEDED FOR WHEEZING AND FOR SHORTNESS OF BREATH, Disp: 150 mL, Rfl: 5   levothyroxine (SYNTHROID) 50 MCG tablet, Take 1 tablet (50 mcg total) by mouth daily before breakfast., Disp: 90 tablet, Rfl: 0   metoprolol succinate (TOPROL-XL) 25 MG 24 hr tablet, Take 1 tablet (25 mg total) by mouth 2 (two) times daily. Take with or immediately following a meal., Disp: , Rfl:    pantoprazole (PROTONIX) 20 MG tablet, Take 1 tablet (20 mg total) by mouth daily., Disp: 90 tablet, Rfl: 1   potassium chloride SA (KLOR-CON M) 20 MEQ tablet, Take 1 tablet (20 mEq total) by mouth daily., Disp: 5 tablet, Rfl: 0   Probiotic CHEW, Chew 2 tablets by mouth  daily., Disp: , Rfl:    traZODone (DESYREL) 50 MG tablet, Take 0.5-1 tablets (25-50 mg total) by mouth at bedtime as needed for sleep., Disp: 30 tablet, Rfl: 3   XARELTO 15 MG TABS tablet, TAKE 1 TABLET EVERY DAY, Disp: 90 tablet, Rfl: 3  Vitals   Vitals:   04/19/23 0200  Weight: 47.6 kg  Height: 5\' 3"  (1.6 m)    Body mass index is 18.59 kg/m.  Physical Exam   Physical Exam  General: Frail-appearing elderly female with depressed level of consciousness HEENT-  [place]/AT  Lungs- Respirations unlabored Extremities- No edema  Neurological Examination Mental Status: Lethargic. Oriented to her age and the month. One word answers to questions only. Increased latencies of verbal and motor responses. Follows all simple commands. Mild naming deficit is noted. Mild dysarthria.  Cranial Nerves: II: Temporal visual fields intact bilaterally. PERRL  III,IV, VI: No ptosis. Eyes are conjugate with right sided gaze preference, but can cross the midline to the left while tracking.   V: FT sensation subjectively equal bilaterally  VII: Left facial droop VIII: Hearing intact to voice IX,X: Hypophonic speech XI: Head is midline XII: Midline, weak tongue extension Motor: RUE 4/5 proximally and distally with no drift LUE flaccid, 0/5 RLE elevates antigravity without drift, 3/5 LLE drifts to bed when elevated, no antigravity strength. Some movement to stimulation.   Sensory: Responds to light pressure in BUE and BLE. Marland Kitchen  Deep Tendon Reflexes: 2+ bilateral brachioradialis, biceps and patellars Cerebellar: No ataxia with FNF on the right, unable to perform on the left.  Gait: Deferred  Labs/Imaging/Neurodiagnostic studies   CBC:  Recent Labs  Lab 05-08-23 0209  HGB 14.6  HCT 43.0   Basic Metabolic Panel:  Lab Results  Component Value Date   NA 139 05/08/2023   K 3.7 05/08/2023   CO2 22 04/14/2023   GLUCOSE 131 (H) 05/08/23   BUN 20 08-May-2023   CREATININE 1.40 (H) May 08, 2023    CALCIUM 9.6 04/14/2023   GFRNONAA 42 (L) 04/04/2023   GFRAA 51 (L) 08/17/2018   Lipid Panel:  Lab Results  Component Value Date   LDLCALC 140 (H) 11/23/2021   HgbA1c:  Lab Results  Component Value Date   HGBA1C 5.8 (H) 11/02/2020   Urine Drug Screen: No results found for: "LABOPIA", "COCAINSCRNUR", "LABBENZ", "AMPHETMU", "THCU", "LABBARB"  Alcohol Level No results found for: "ETH" INR  Lab Results  Component Value Date   INR 1.3 (H) 04/03/2023   APTT  Lab Results  Component Value Date   APTT 25 03/04/2016     ASSESSMENT  89 y.o. female with a PMHx of atrial fibrillation (on Xarelto), pacemaker, arthritis, recent Covid infection, HFrEF, GERD, HTN, hypothyroidism, macular degeneration, mitral regurgitation, PE (2019), prior stroke without significant residual deficits and SVT who presents from home to the ED via EMS as a Code Stroke for acute onset of aphasia and left sided weakness. Per family communication with EMS, the patient was LKN at 2220 when she was walking to a recliner. She got into the recliner and then was noted by family to be aphasic with left sided weakness. When EMS arrived they found same. EMS VS 169/97. CBG 131 on arrival.  - Exam reveals deficits best localizable to the right MCA territory.  - CT head: No acute abnormality - Cr 1.4. Estimated GFR is 38. WBC normal. PT, PTT and INR elevated in the context of being on Xarelto.  - CTA of head and neck: Occlusion versus critical stenosis of the right M1 MCA with asymmetrically diminished opacification of more distal right MCA vessels. Suspected thrombus is visualized within the right M1 MCA and extends into proximal M2 MCA branches.  - She is not a TNK candidate due to anticoagulation with Xarelto.  - The patient is a VIR candidate. Risks/benefits of the procedure were discussed extensively with the patient's daughter, including approximately 50% chance of significant improvement relative to an approximate 10% chance  of subarachnoid hemorrhage with possibility of significant worsening including death. Prognosis without treatment also discussed. The patient's daughter expressed  understanding and provided informed consent to proceed with VIR. All questions answered. Due to extensive/prolonged discussion of risks/benefits, there was some delay between CTA completion time and decision to proceed with VIR.    RECOMMENDATIONS  - Code IR has been activated - Admitting to the Neuro ICU following VIR  - Post-VIR order set to include frequent neuro checks and BP management.  - No antiplatelet medications or anticoagulants for at least 24 hours following VIR, unless a stent is placed.  - DVT prophylaxis with SCDs.  - If no hemorrhagic conversion on repeat CT at 24 hours, will resume Xarelto - TTE.  - MRI brain if her pacemaker is MRI-compatible - PT/OT/Speech.  - NPO until passes swallow evaluation.  - Telemetry monitoring - Fasting lipid panel, HgbA1c - Will not start her on a statin due to side effect of myalgias experienced in the past with statin use - Family wishes patient to be full code for the procedure and recovery. May change status after she is extubated post-procedure. They are amenable to life-saving intubation and management of cardiac arrest if it would not result in being on prolonged life support. If she requires prolonged life support they state that they would revisit decision making regarding cardiac resuscitation and intubation.  - Stroke Team to follow in the morning ______________________________________________________________________  70 minutes spent in the emergent neurological evaluation and management of this critically ill patient  Signed, Otelia Limes, Iveliz Garay, MD [facility]

## 2023-04-20 ENCOUNTER — Ambulatory Visit: Payer: Self-pay

## 2023-04-20 ENCOUNTER — Inpatient Hospital Stay (HOSPITAL_COMMUNITY)

## 2023-04-20 DIAGNOSIS — I6389 Other cerebral infarction: Secondary | ICD-10-CM | POA: Diagnosis not present

## 2023-04-20 DIAGNOSIS — I63411 Cerebral infarction due to embolism of right middle cerebral artery: Secondary | ICD-10-CM | POA: Diagnosis not present

## 2023-04-20 LAB — BASIC METABOLIC PANEL
Anion gap: 4 — ABNORMAL LOW (ref 5–15)
BUN: 16 mg/dL (ref 8–23)
CO2: 24 mmol/L (ref 22–32)
Calcium: 8.7 mg/dL — ABNORMAL LOW (ref 8.9–10.3)
Chloride: 111 mmol/L (ref 98–111)
Creatinine, Ser: 1.39 mg/dL — ABNORMAL HIGH (ref 0.44–1.00)
GFR, Estimated: 36 mL/min — ABNORMAL LOW (ref >60)
Glucose, Bld: 99 mg/dL (ref 70–99)
Potassium: 5.7 mmol/L — ABNORMAL HIGH (ref 3.5–5.1)
Sodium: 139 mmol/L (ref 135–145)

## 2023-04-20 LAB — MAGNESIUM: Magnesium: 2.2 mg/dL (ref 1.7–2.4)

## 2023-04-20 LAB — CBC
HCT: 38.4 % (ref 36.0–46.0)
Hemoglobin: 12 g/dL (ref 12.0–15.0)
MCH: 28.5 pg (ref 26.0–34.0)
MCHC: 31.3 g/dL (ref 30.0–36.0)
MCV: 91.2 fL (ref 80.0–100.0)
Platelets: 216 10*3/uL (ref 150–400)
RBC: 4.21 MIL/uL (ref 3.87–5.11)
RDW: 16.7 % — ABNORMAL HIGH (ref 11.5–15.5)
WBC: 7.8 10*3/uL (ref 4.0–10.5)
nRBC: 0 % (ref 0.0–0.2)

## 2023-04-20 LAB — ECHOCARDIOGRAM COMPLETE
AR max vel: 2.19 cm2
AV Area VTI: 1.95 cm2
AV Area mean vel: 1.95 cm2
AV Mean grad: 1 mmHg
AV Peak grad: 2.7 mmHg
AV Vena cont: 0.5 cm
Ao pk vel: 0.82 m/s
Area-P 1/2: 2.42 cm2
Height: 63 in
MV VTI: 0.71 cm2
P 1/2 time: 567 ms
Radius: 1 cm
S' Lateral: 4.2 cm
Weight: 1679.02 [oz_av]

## 2023-04-20 LAB — TRIGLYCERIDES: Triglycerides: 87 mg/dL (ref <150)

## 2023-04-20 MED ORDER — POLYETHYLENE GLYCOL 3350 17 G PO PACK
17.0000 g | PACK | Freq: Every day | ORAL | Status: DC
  Filled 2023-04-20: qty 1

## 2023-04-20 MED ORDER — SODIUM CHLORIDE 0.9 % IV SOLN: INTRAVENOUS | Status: AC

## 2023-04-20 MED ORDER — DOCUSATE SODIUM 50 MG/5ML PO LIQD
100.0000 mg | Freq: Two times a day (BID) | ORAL | Status: DC
  Administered 2023-04-20 – 2023-04-21 (×2): 100 mg via ORAL
  Filled 2023-04-20: qty 10

## 2023-04-20 MED ORDER — PERFLUTREN LIPID MICROSPHERE
1.0000 mL | INTRAVENOUS | Status: DC | PRN
  Administered 2023-04-20: 3 mL via INTRAVENOUS

## 2023-04-20 MED ORDER — EZETIMIBE 10 MG PO TABS
10.0000 mg | ORAL_TABLET | Freq: Every day | ORAL | Status: DC
  Administered 2023-04-20 – 2023-04-21 (×2): 10 mg via ORAL
  Filled 2023-04-20 (×2): qty 1

## 2023-04-20 MED ORDER — SODIUM ZIRCONIUM CYCLOSILICATE 10 G PO PACK: 10.0000 g | PACK | Freq: Once | ORAL | Status: DC

## 2023-04-20 MED ORDER — APIXABAN 2.5 MG PO TABS
2.5000 mg | ORAL_TABLET | Freq: Two times a day (BID) | ORAL | Status: DC
  Administered 2023-04-20 – 2023-04-21 (×2): 2.5 mg via ORAL
  Filled 2023-04-20 (×3): qty 1

## 2023-04-20 NOTE — Progress Notes (Signed)
 Pt off the unit for MBS per Dr. Roda Shutters pt can travel without nurse.    Dr. Roda Shutters made aware of decreased urine output (see flowsheet). No new orders.

## 2023-04-20 NOTE — Progress Notes (Signed)
 Pt with orders to transfer to 3W. Report called to [place], RN all questions answered. Pt and all belongings transported via bed without incident. Pt daughter remained at bedside.

## 2023-04-20 NOTE — Progress Notes (Signed)
 SLP Cancellation Note  Patient Details Name: Frances Kent MRN: [id number] DOB: November 16, 1930   Cancelled treatment:       Reason Eval/Treat Not Completed: (P) Fatigue/lethargy limiting ability to participate (RN reports pt too sleepy for MBS at this time; requested she contact SLP team when/if pt is ready/appropriate)   Chales Abrahams 04/20/2023, 7:46 AM  Rolena Infante, MS [facility] SLP Acute Rehab Services Office [phone]

## 2023-04-20 NOTE — Progress Notes (Signed)
*  PRELIMINARY RESULTS* Echocardiogram 2D Echocardiogram has been performed.  Frances Kent 04/20/2023, 11:23 AM

## 2023-04-20 NOTE — PMR Pre-admission (Signed)
 PMR Admission Coordinator Pre-Admission Assessment  Patient: Frances Kent is an 89 y.o., female MRN: [id number] DOB: 1930/06/08 Height: 5\' 3"  (160 cm) Weight: 47.6 kg  Insurance Information HMO: Yes    PPO:      PCP:      IPA:      80/20:      OTHER: Group [id number] PRIMARY: [employer] HMO      Policy#: [id number]      Subscriber: patient CM Name: Danella Penton      Phone#: [phone]     Fax#: [phone] Pre-Cert#: [id number]   Received approval from [place] on 04/21/23. Pt approved from 04/21/23-04/25/23.   Employer: Retired Benefits:  Phone #: [phone]     Name: online at [email or web address] Eff. Date: 02/15/23     Deduct: $0      Out of Pocket Max: [phone] (met $74.20)      Life Max: n/a CIR: $399/day copay for 7 days max $2793/admission      SNF: $10/day for days 1-20; $214/day for days 21-100 Outpatient:       Co-Pay: $25/visit Home Health: 100%      Co-Pay: none DME: 80%     Co-Pay: 20% Providers: in network  SECONDARY:       Policy#:      Phone#:   Artist:       Phone#:   The Data processing manager" for patients in Inpatient Rehabilitation Facilities with attached "Privacy Act Statement-Health Care Records" was provided and verbally reviewed with: Patient and Family  Emergency Contact Information Contact Information     Name Relation Home Work Adrian D Daughter [phone]  [phone]   [facility] Daughter [phone]  [phone]      Other Contacts   None on File     Current Medical History  Patient Admitting Diagnosis: R CVA/B cerebellar CVA  History of Present Illness: A 89 y.o. female with a PMHx of atrial fibrillation (on Xarelto), pacemaker, arthritis, recent Covid infection, HFrEF, GERD, HTN, hypothyroidism, macular degeneration, mitral regurgitation, PE (2019), prior stroke without significant residual deficits and SVT who presents from home to the [facility] ED on 04/19/23 via EMS as a  Code Stroke for acute onset of aphasia and left sided weakness. Per family communication with EMS, the patient was LKN at 2220 when she was walking to a recliner. She got into the recliner and then was noted by family to be aphasic with left sided weakness. When EMS arrived they found same. EMS VS 169/97, placed on 4L [place] for comfort. CBG 131 on arrival.  MRI brain on 04/19/23 showed acute infarcts in the right insula, right frontal lobe and the cerebellum bilaterally.  Underwent IR thrombectomy by Dr. Corliss Skains on 04/19/23 and transferred to Neuro ICU post-op.  PT/OT/SLP evaluations completed with recommendations for acute inpatient rehab admission.  Complete NIHSS TOTAL: 0  Patient's medical record from [facility] has been reviewed by the rehabilitation admission coordinator and physician.  Past Medical History  Past Medical History:  Diagnosis Date   Arthritis    Atrial fibrillation (HCC)    Benign breast cyst in female, left 10/07/2016   Candida esophagitis (HCC) 05/03/2021   Found on Mar 10 2021 EGD    CAP (community acquired pneumonia) 02/15/2021   CHF (congestive heart failure) (HCC)    Colon adenomas    GERD (gastroesophageal reflux disease)    HCAP (healthcare-associated pneumonia) 02/02/2021   Hypertension    Hypothyroidism  Macular degeneration    Mitral regurgitation    Pulmonary embolism (HCC) 02/2016   Severe sepsis (HCC) 02/02/2021   Stroke (HCC) 10/2020   SVT (supraventricular tachycardia) (HCC)     Has the patient had major surgery during 100 days prior to admission? Yes  Family History   family history includes Breast cancer (age of onset: 87) in her mother; Cancer (age of onset: 75) in her son; Cancer (age of onset: 6) in her mother; Heart disease in her son; Heart disease (age of onset: 78) in her maternal grandmother; Stroke in her sister.  Current Medications  Current Facility-Administered Medications:    0.9 %  sodium chloride infusion, ,  Intravenous, Continuous, de Saintclair Halsted, [place] E, NP, Stopped at 04/20/23 1047   acetaminophen (TYLENOL) tablet 650 mg, 650 mg, Oral, Q4H PRN **OR** acetaminophen (TYLENOL) 160 MG/5ML solution 650 mg, 650 mg, Per Tube, Q4H PRN **OR** acetaminophen (TYLENOL) suppository 650 mg, 650 mg, Rectal, Q4H PRN, Caryl Pina, MD   albuterol (PROVENTIL) (2.5 MG/3ML) 0.083% nebulizer solution 2.5 mg, 2.5 mg, Nebulization, Q4H PRN, Caryl Pina, MD   aspirin suppository 300 mg, 300 mg, Rectal, Daily, Marvel Plan, MD, 300 mg at 04/20/23 0102   Chlorhexidine Gluconate Cloth 2 % PADS 6 each, 6 each, Topical, Daily, Caryl Pina, MD, 6 each at 04/20/23 0942   docusate (COLACE) 50 MG/5ML liquid 100 mg, 100 mg, Per Tube, BID, Duayne Cal, NP, 100 mg at 04/19/23 0903   enoxaparin (LOVENOX) injection 30 mg, 30 mg, Subcutaneous, Q24H, Marvel Plan, MD, 30 mg at 04/20/23 7253   levothyroxine (SYNTHROID) tablet 50 mcg, 50 mcg, Oral, Q0600, Caryl Pina, MD   Oral care mouth rinse, 15 mL, Mouth Rinse, PRN, Caryl Pina, MD   pantoprazole (PROTONIX) injection 40 mg, 40 mg, Intravenous, Q24H, Luciano Cutter, MD, 40 mg at 04/20/23 0809   polyethylene glycol (MIRALAX / GLYCOLAX) packet 17 g, 17 g, Per Tube, Daily, Duayne Cal, NP, 17 g at 04/19/23 6644   senna-docusate (Senokot-S) tablet 1 tablet, 1 tablet, Oral, QHS PRN, Caryl Pina, MD  Patients Current Diet:  Diet Order             Diet regular Room service appropriate? Yes with Assist; Fluid consistency: Thin  Diet effective now                   Precautions / Restrictions Precautions Precautions: Fall Restrictions Weight Bearing Restrictions Per Provider Order: No   Has the patient had 2 or more falls or a fall with injury in the past year? No  Prior Activity Level Household: Goes out for MD appointments  Prior Functional Level Self Care: Did the patient need help bathing, dressing, using the toilet or eating? Independent  Indoor  Mobility: Did the patient need assistance with walking from room to room (with or without device)? Independent  Stairs: Did the patient need assistance with internal or external stairs (with or without device)? Needed some help  Functional Cognition: Did the patient need help planning regular tasks such as shopping or remembering to take medications? Independent  Patient Information Are you of Hispanic, Latino/a,or Spanish origin?: A. No, not of Hispanic, Latino/a, or Spanish origin What is your race?: A. White Do you need or want an interpreter to communicate with a doctor or health care staff?: 0. No  Patient's Response To:  Health Literacy and Transportation Is the patient able to respond to health literacy and transportation needs?: Yes Health Literacy -  How often do you need to have someone help you when you read instructions, pamphlets, or other written material from your doctor or pharmacy?: Rarely In the past 12 months, has lack of transportation kept you from medical appointments or from getting medications?: No In the past 12 months, has lack of transportation kept you from meetings, work, or from getting things needed for daily living?: No  Home Assistive Devices / Equipment Home Equipment: Agricultural consultant (2 wheels), Shower seat, BSC/3in1, Toilet riser, Grab bars - toilet, Lift chair  Prior Device Use: Indicate devices/aids used by the patient prior to current illness, exacerbation or injury? Manual wheelchair, Environmental consultant, and Lift chair  Current Functional Level Cognition  Arousal/Alertness: Awake/alert Overall Cognitive Status: History of cognitive impairments - at baseline Orientation Level: Oriented X4 Memory: Appears intact Awareness: Appears intact    Extremity Assessment (includes Sensation/Coordination)  Upper Extremity Assessment: Right hand dominant, RUE deficits/detail, LUE deficits/detail RUE Deficits / Details: baseline tremor, prior decreased coordination of R  hand after stroke 3 years ago the patietn reports. pt holds digits in claw position but can fully extend with cues and time. No prior splint wear LUE Deficits / Details: baseline tremor noted  Lower Extremity Assessment: Defer to PT evaluation RLE Deficits / Details: RLE grossly 4-/5 LLE Deficits / Details: LLE grossly 3+/5    ADLs  Overall ADL's : Needs assistance/impaired Eating/Feeding: NPO Grooming: Moderate assistance Upper Body Bathing: Moderate assistance Lower Body Bathing: Maximal assistance Upper Body Dressing : Moderate assistance Lower Body Dressing: Maximal assistance Toilet Transfer: Moderate assistance, +2 for physical assistance, +2 for safety/equipment, Stand-pivot    Mobility  Overal bed mobility: Needs Assistance Bed Mobility: Rolling, Supine to Sit Rolling: Min assist Supine to sit: Min assist General bed mobility comments: pt needs cues to sequence task and help getting onto L elbow and then able to initiate pushing into sitting.    Transfers  Overall transfer level: Needs assistance Equipment used: 2 person hand held assist Transfers: Sit to/from Stand, Bed to chair/wheelchair/BSC Sit to Stand: +2 physical assistance, Mod assist Bed to/from chair/wheelchair/BSC transfer type::  (steps to chair with weight shift by therapist to advance) Step pivot transfers: Mod assist, +2 physical assistance General transfer comment: pt relies on R UE to hold and strong L lateral lean    Ambulation / Gait / Stairs / Wheelchair Mobility       Posture / Balance Balance Overall balance assessment: Needs assistance Sitting-balance support: Single extremity supported, Feet supported Sitting balance-Leahy Scale: Poor Postural control: Left lateral lean Standing balance support: Single extremity supported, During functional activity Standing balance-Leahy Scale: Poor Standing balance comment: modA    Special needs/care consideration Skin Reddened coccyx with dressing and  Bladder Incontinence   Previous Home Environment (from acute therapy documentation) Living Arrangements: Alone  Lives With: Family Available Help at Discharge: Family Type of Home: House Home Layout: One level, Laundry or work area in basement Home Access: Level entry (front entrance with 1STE) Bathroom Shower/Tub: Health visitor: Standard Additional Comments: has x2 dogs great great daughters on property that daugther cares for. pt has no animal care needs. pt gets all meals provided by daughters that live on the same price of land. one daughter had pins placed in knee in less than a week ago so will have limited physcial (A) for the patient  Discharge Living Setting Plans for Discharge Living Setting: Patient's home, Alone (Lives alone with assist from daughters.) Type of Home at Discharge: [facility] Discharge  Home Layout: Two level, Laundry or work area in basement, Able to live on main level with bedroom/bathroom, Full bath on main level (Does not go down to basement any more.) Alternate Level Stairs-Number of Steps: Flight Discharge Home Access: Level entry Discharge Bathroom Shower/Tub: Walk-in shower, Door Discharge Bathroom Toilet: Handicapped height Discharge Bathroom Accessibility: Yes How Accessible: Accessible via walker  Social/Family/Support Systems Patient Roles: Parent (Has five living children.) Contact Information: Windell Hummingbird - daughter - [phone] Anticipated Caregiver: Drue Second - daughter Anticipated Caregiver's Contact Information: [name] - [phone] Ability/Limitations of Caregiver: Rinaldo Cloud fell and had pins placed in her knee, so not much help.  [name] can stay with patient.  [name] works Systems developer but can get family to stay if she goes to work. Caregiver Availability: 24/7 Discharge Plan Discussed with Primary Caregiver: Yes Is Caregiver In Agreement with Plan?: Yes Does Caregiver/Family have Issues with Lodging/Transportation while  Pt is in Rehab?: No  Goals Patient/Family Goal for Rehab: PT/OT/SLP supervision goals Expected length of stay: 7 - 10 days Pt/Family Agrees to Admission and willing to participate: Yes Program Orientation Provided & Reviewed with Pt/Caregiver Including Roles  & Responsibilities: Yes  Decrease burden of Care through IP rehab admission: N/A  Possible need for SNF placement upon discharge: Not anticipated  Patient Condition: I have reviewed medical records from [facility], spoken with CM, and patient and daughter. I met with patient at the bedside for inpatient rehabilitation assessment.  Patient will benefit from ongoing PT, OT, and SLP, can actively participate in 3 hours of therapy a day 5 days of the week, and can make measurable gains during the admission.  Patient will also benefit from the coordinated team approach during an Inpatient Acute Rehabilitation admission.  The patient will receive intensive therapy as well as Rehabilitation physician, nursing, social worker, and care management interventions.  Due to bladder management, bowel management, safety, skin/wound care, disease management, medication administration, pain management, and patient education the patient requires 24 hour a day rehabilitation nursing.  The patient is currently Min-Mod A with mobility and Mod-Max A with basic ADLs.  Discharge setting and therapy post discharge at home with home health is anticipated.  Patient has agreed to participate in the Acute Inpatient Rehabilitation Program and will admit today.  Preadmission Screen Completed By:  Trish Mage, 04/20/2023 2:43 PM ______________________________________________________________________   Discussed status with Dr. Shearon Stalls on 04/21/23 at 11:58 AM and received approval for admission today.  Admission Coordinator:  Trish Mage, RN, time 11:58 AM/Date 04/21/23    Assessment/Plan: Diagnosis: R MCA stroke  Does the need for close, 24 hr/day Medical  supervision in concert with the patient's rehab needs make it unreasonable for this patient to be served in a less intensive setting? Yes Co-Morbidities requiring supervision/potential complications: A fib, hypertension, AHRF, HFpEF, AKI, and wound montorroing s/p thrombectomy Due to safety, skin/wound care, disease management, medication administration, and patient education, does the patient require 24 hr/day rehab nursing? Yes Does the patient require coordinated care of a physician, rehab nurse, PT, OT, and SLP to address physical and functional deficits in the context of the above medical diagnosis(es)? Yes Addressing deficits in the following areas: balance, endurance, locomotion, strength, transferring, bowel/bladder control, bathing, dressing, feeding, grooming, toileting, cognition, and speech Can the patient actively participate in an intensive therapy program of at least 3 hrs of therapy 5 days a week? Yes The potential for patient to make measurable gains while on inpatient rehab is good Anticipated functional outcomes  upon discharge from inpatient rehab: supervision PT, supervision OT, supervision SLP Estimated rehab length of stay to reach the above functional goals is: 7-10 days Anticipated discharge destination: Home 10. Overall Rehab/Functional Prognosis: good   MD Signature:  Angelina Sheriff, DO 04/21/2023

## 2023-04-20 NOTE — Progress Notes (Signed)
 STROKE TEAM PROGRESS NOTE   SUBJECTIVE (INTERVAL HISTORY) Her daughter is at the bedside.  Pt was extubated yesterday and has worked with PT and OT. Pending MBS to see if she can be put on diet.    OBJECTIVE Temp:  [98.1 F (36.7 C)-98.7 F (37.1 C)] 98.1 F (36.7 C) (03/06 1152) Pulse Rate:  [50-73] 50 (03/06 1000) Cardiac Rhythm: Sinus bradycardia (03/06 0800) Resp:  [9-24] 12 (03/06 1000) BP: (90-137)/(41-98) 108/49 (03/06 1000) SpO2:  [91 %-99 %] 97 % (03/06 1000)  No results for input(s): "GLUCAP" in the last 168 hours. Recent Labs  Lab 04/14/23 1421 04/19/23 0208 04/19/23 0209 04/19/23 0606 04/20/23 0604  NA 142 138 139 137 139  K 4.6 3.8 3.7 3.5 5.7*  CL 100 103 105  --  111  CO2 22 23  --   --  24  GLUCOSE 99 133* 131*  --  99  BUN 28 18 20   --  16  CREATININE 1.78* 1.33* 1.40*  --  1.39*  CALCIUM 9.6 9.3  --   --  8.7*  MG  --   --   --   --  2.2   Recent Labs  Lab 04/19/23 0208  AST 26  ALT 15  ALKPHOS 57  BILITOT 0.5  PROT 6.7  ALBUMIN 3.6   Recent Labs  Lab 04/19/23 0208 04/19/23 0209 04/19/23 0606 04/20/23 0604  WBC 6.1  --   --  7.8  NEUTROABS 3.0  --   --   --   HGB 13.5 14.6 11.9* 12.0  HCT 42.2 43.0 35.0* 38.4  MCV 90.8  --   --  91.2  PLT 210  --   --  216   No results for input(s): "CKTOTAL", "CKMB", "CKMBINDEX", "TROPONINI" in the last 168 hours. Recent Labs    04/19/23 0208 04/19/23 0421  LABPROT 42.1* 46.1*  INR 4.4* 4.9*   Recent Labs    04/19/23 0553  COLORURINE YELLOW  LABSPEC >1.046*  PHURINE 5.0  GLUCOSEU NEGATIVE  HGBUR MODERATE*  BILIRUBINUR NEGATIVE  KETONESUR NEGATIVE  PROTEINUR NEGATIVE  NITRITE NEGATIVE  LEUKOCYTESUR SMALL*       Component Value Date/Time   CHOL 239 (H) 04/19/2023 0548   TRIG 87 04/20/2023 0604   HDL 82 04/19/2023 0548   CHOLHDL 2.9 04/19/2023 0548   VLDL 14 04/19/2023 0548   LDLCALC 143 (H) 04/19/2023 0548   Lab Results  Component Value Date   HGBA1C 6.2 (H) 04/19/2023       Component Value Date/Time   LABOPIA NONE DETECTED 04/19/2023 0625   COCAINSCRNUR NONE DETECTED 04/19/2023 0625   LABBENZ NONE DETECTED 04/19/2023 0625   AMPHETMU NONE DETECTED 04/19/2023 0625   THCU NONE DETECTED 04/19/2023 0625   LABBARB NONE DETECTED 04/19/2023 0625    Recent Labs  Lab 04/19/23 0208  ETH <10    I have personally reviewed the radiological images below and agree with the radiology interpretations.  IR PERCUTANEOUS ART THROMBECTOMY/INFUSION INTRACRANIAL INC DIAG ANGIO Result Date: 04/20/2023 INDICATION: New onset left-sided hemiplegia, and aphasia.  Right gaze deviation. Occluded right middle cerebral artery M1 segment on CT angiogram of the head and neck. EXAM: 1. EMERGENT LARGE VESSEL OCCLUSION THROMBOLYSIS (anterior CIRCULATION) COMPARISON:  CT angiogram of the head and neck April 19, 2023. MEDICATIONS: No antibiotic was administered within 1 hour of the procedure. ANESTHESIA/SEDATION: General anesthesia. CONTRAST:  Omnipaque 300 approximately 70 mL. FLUOROSCOPY TIME:  Fluoroscopy Time: 49 minutes 18 seconds (693 mGy). COMPLICATIONS:  None immediate. TECHNIQUE: Following a full explanation of the procedure along with the potential associated complications, an informed witnessed consent was obtained. The risks of intracranial hemorrhage of 10%, worsening neurological deficit, ventilator dependency, death and inability to revascularize were all reviewed in detail with the patient's son. The patient was then put under general anesthesia by the Department of Anesthesiology at [facility]. The right groin was prepped and draped in the usual sterile fashion. Thereafter using modified Seldinger technique, transfemoral access into the right common femoral artery was obtained without difficulty. Over an 0.035 inch guidewire an 8 French 25 cm Pinnacle sheath was inserted. Through this, and also over an 0.035 inch guidewire a combination of an 088 100 cm Zoom support catheter with a  120 cm 6 [place] VDK catheter was advanced to the aortic arch region, and selectively positioned in the right common carotid artery. The support catheter and the guidewire were removed. Good aspiration was obtained from the hub of the 088 Zoom support catheter just inside the right internal carotid artery. A control arteriogram was then performed centered extra cranially and intracranially. FINDINGS: The right external carotid artery and its major branches demonstrate wide patency. The right internal carotid artery at the bulb in its proximal 1/3 demonstrates wide patency. The mid 1/3 of the right ICA demonstrates focal areas of mild smooth irregularity associated with mild intraluminal narrowing most consistent with moderate FMD. Distal to this, right ICA is widely patent in the petrous segment. Slow ascent of contrast is seen into the supraclinoid right ICA with traces of contrast noted in the right middle cerebral artery proximally. Also noted is flow in the right anterior cerebral A1 segment. PROCEDURE: Through the 088 Zoom aspiration catheter now in the distal 1/3 of the right ICA, a combination of an 062 132 cm Zoom aspiration catheter with a 150 cm Phenom microcatheter was advanced over an 018 inch micro guidewire with a moderate J configuration to the supraclinoid right ICA. The micro guidewire was then gently manipulated using a torque device and advanced through the occluded right middle cerebral artery into the inferior branch of the right middle cerebral artery M2 segment followed by the microcatheter and the 062 Zoom catheter which was advanced into the occluded distal M1 segment. The microcatheter and the micro guidewire were removed as constant aspiration was applied at the hub of the 062 aspiration catheter using a pump for 2 minutes. Thereafter, the 062 aspiration catheter was retrieved and removed. A control arteriogram performed through the 088 Zoom catheter now in the distal petrous segment  demonstrated complete opacification of the right anterior cerebral artery achieving a TICI 3 revascularization. The right middle cerebral artery demonstrated opacification of the superior division achieving a TICI 2B revascularization. A second pass was then made again using the above combination this time the micro guidewire and the microcatheter combination advanced into the M2 M3 segment of the inferior division where, the micro guidewire was removed. Good aspiration obtained from the hub of the microcatheter. A gentle contrast injection demonstrated safe positioning of the tip of the microcatheter. This was then connected to continuous saline infusion. A 4 mm x 40 mm Solitaire X retrieval device was then deployed in the usual manner. The 062 aspiration catheter was then advanced into the proximal inferior division. Constant aspiration of the 062 aspiration catheter with a pump and aspiration at the hub of the 088 guide catheter was then performed for 2 minutes. Thereafter, the retrieval device, the microcatheter and the  062 aspiration catheter was retrieved and removed. A control arteriogram performed through the support catheter in the right internal carotid artery now demonstrated revascularization of the right MCA distribution. Patency was intact in the right anterior cerebral artery distribution except for an occluded mid A3 segment of the right anterior cerebral artery. A combination of an 062 aspiration catheter with a 0.01 50 cm microcatheter was then advanced over an 018 inch Aristotle micro guidewire to the right M1 segment and then into the right anterior cerebral artery distal A3 segment followed the microcatheter. The guidewire was removed. Good aspiration obtained from the hub of the microcatheter. Aspiration was then applied at the hub of the microcatheter as it was retrieved proximally simultaneously through the 062 aspiration catheter in the proximal A2 segment. The combination was retrieved and  removed. A control arteriogram performed through the Zoom 088 in the right internal carotid artery demonstrated revascularization of the distal right anterior cerebral A3 A4 region albeit slow flow. Flow remained sustained distal to this on subsequent control arteriograms. A final control arteriogram performed through the Zoom 088 aspiration catheter in the distal right common carotid artery demonstrated no change in the moderate FMD-like changes in the mid cervical right ICA. More distally, the right middle cerebral artery distribution continues to maintain a TICI 3 revascularization. The Zoom 088 support catheter was removed. An 8 French Angio-Seal closure device was deployed at the right groin puncture site. Distal pulses remained present bilaterally unchanged from prior to the procedure. A flat panel CT of the brain demonstrated no evidence of hemorrhagic complications. The patient was left intubated due to her medical condition as per anesthesia. She was then transferred to neuro ICU for post revascularization care. IMPRESSION: Status post revascularization of occluded right middle cerebral artery M1 segment in the inferior divisions of the right middle cerebral artery with 1 pass with contact aspiration, and 1 pass with contact aspiration and a 4 mm x 40 mm Solitaire X retrieval device achieving a TICI 3 revascularization. Status post aspiration of proximal A3 segment of the right anterior cerebral artery with 1 pass with contact aspiration achieving a TICI 2C revascularization. PLAN: Per referring MD. Electronically Signed   By: Julieanne Cotton M.D.   On: 04/20/2023 08:27   IR CT Head Ltd Result Date: 04/20/2023 INDICATION: New onset left-sided hemiplegia, and aphasia.  Right gaze deviation. Occluded right middle cerebral artery M1 segment on CT angiogram of the head and neck. EXAM: 1. EMERGENT LARGE VESSEL OCCLUSION THROMBOLYSIS (anterior CIRCULATION) COMPARISON:  CT angiogram of the head and neck April 19, 2023. MEDICATIONS: No antibiotic was administered within 1 hour of the procedure. ANESTHESIA/SEDATION: General anesthesia. CONTRAST:  Omnipaque 300 approximately 70 mL. FLUOROSCOPY TIME:  Fluoroscopy Time: 49 minutes 18 seconds (693 mGy). COMPLICATIONS: None immediate. TECHNIQUE: Following a full explanation of the procedure along with the potential associated complications, an informed witnessed consent was obtained. The risks of intracranial hemorrhage of 10%, worsening neurological deficit, ventilator dependency, death and inability to revascularize were all reviewed in detail with the patient's son. The patient was then put under general anesthesia by the Department of Anesthesiology at [facility]. The right groin was prepped and draped in the usual sterile fashion. Thereafter using modified Seldinger technique, transfemoral access into the right common femoral artery was obtained without difficulty. Over an 0.035 inch guidewire an 8 French 25 cm Pinnacle sheath was inserted. Through this, and also over an 0.035 inch guidewire a combination of an 088 100 cm  Zoom support catheter with a 120 cm 6 [place] VDK catheter was advanced to the aortic arch region, and selectively positioned in the right common carotid artery. The support catheter and the guidewire were removed. Good aspiration was obtained from the hub of the 088 Zoom support catheter just inside the right internal carotid artery. A control arteriogram was then performed centered extra cranially and intracranially. FINDINGS: The right external carotid artery and its major branches demonstrate wide patency. The right internal carotid artery at the bulb in its proximal 1/3 demonstrates wide patency. The mid 1/3 of the right ICA demonstrates focal areas of mild smooth irregularity associated with mild intraluminal narrowing most consistent with moderate FMD. Distal to this, right ICA is widely patent in the petrous segment. Slow ascent of contrast  is seen into the supraclinoid right ICA with traces of contrast noted in the right middle cerebral artery proximally. Also noted is flow in the right anterior cerebral A1 segment. PROCEDURE: Through the 088 Zoom aspiration catheter now in the distal 1/3 of the right ICA, a combination of an 062 132 cm Zoom aspiration catheter with a 150 cm Phenom microcatheter was advanced over an 018 inch micro guidewire with a moderate J configuration to the supraclinoid right ICA. The micro guidewire was then gently manipulated using a torque device and advanced through the occluded right middle cerebral artery into the inferior branch of the right middle cerebral artery M2 segment followed by the microcatheter and the 062 Zoom catheter which was advanced into the occluded distal M1 segment. The microcatheter and the micro guidewire were removed as constant aspiration was applied at the hub of the 062 aspiration catheter using a pump for 2 minutes. Thereafter, the 062 aspiration catheter was retrieved and removed. A control arteriogram performed through the 088 Zoom catheter now in the distal petrous segment demonstrated complete opacification of the right anterior cerebral artery achieving a TICI 3 revascularization. The right middle cerebral artery demonstrated opacification of the superior division achieving a TICI 2B revascularization. A second pass was then made again using the above combination this time the micro guidewire and the microcatheter combination advanced into the M2 M3 segment of the inferior division where, the micro guidewire was removed. Good aspiration obtained from the hub of the microcatheter. A gentle contrast injection demonstrated safe positioning of the tip of the microcatheter. This was then connected to continuous saline infusion. A 4 mm x 40 mm Solitaire X retrieval device was then deployed in the usual manner. The 062 aspiration catheter was then advanced into the proximal inferior division.  Constant aspiration of the 062 aspiration catheter with a pump and aspiration at the hub of the 088 guide catheter was then performed for 2 minutes. Thereafter, the retrieval device, the microcatheter and the 062 aspiration catheter was retrieved and removed. A control arteriogram performed through the support catheter in the right internal carotid artery now demonstrated revascularization of the right MCA distribution. Patency was intact in the right anterior cerebral artery distribution except for an occluded mid A3 segment of the right anterior cerebral artery. A combination of an 062 aspiration catheter with a 0.01 50 cm microcatheter was then advanced over an 018 inch Aristotle micro guidewire to the right M1 segment and then into the right anterior cerebral artery distal A3 segment followed the microcatheter. The guidewire was removed. Good aspiration obtained from the hub of the microcatheter. Aspiration was then applied at the hub of the microcatheter as it was retrieved proximally simultaneously through  the 062 aspiration catheter in the proximal A2 segment. The combination was retrieved and removed. A control arteriogram performed through the Zoom 088 in the right internal carotid artery demonstrated revascularization of the distal right anterior cerebral A3 A4 region albeit slow flow. Flow remained sustained distal to this on subsequent control arteriograms. A final control arteriogram performed through the Zoom 088 aspiration catheter in the distal right common carotid artery demonstrated no change in the moderate FMD-like changes in the mid cervical right ICA. More distally, the right middle cerebral artery distribution continues to maintain a TICI 3 revascularization. The Zoom 088 support catheter was removed. An 8 French Angio-Seal closure device was deployed at the right groin puncture site. Distal pulses remained present bilaterally unchanged from prior to the procedure. A flat panel CT of the brain  demonstrated no evidence of hemorrhagic complications. The patient was left intubated due to her medical condition as per anesthesia. She was then transferred to neuro ICU for post revascularization care. IMPRESSION: Status post revascularization of occluded right middle cerebral artery M1 segment in the inferior divisions of the right middle cerebral artery with 1 pass with contact aspiration, and 1 pass with contact aspiration and a 4 mm x 40 mm Solitaire X retrieval device achieving a TICI 3 revascularization. Status post aspiration of proximal A3 segment of the right anterior cerebral artery with 1 pass with contact aspiration achieving a TICI 2C revascularization. PLAN: Per referring MD. Electronically Signed   By: Julieanne Cotton M.D.   On: 04/20/2023 08:27   MR BRAIN WO CONTRAST Result Date: 04/19/2023 CLINICAL DATA:  Stroke, follow up EXAM: MRI HEAD WITHOUT CONTRAST MRA HEAD WITHOUT CONTRAST TECHNIQUE: Multiplanar, multi-echo pulse sequences of the brain and surrounding structures were acquired without intravenous contrast. Angiographic images of the Circle of Willis were acquired using MRA technique without intravenous contrast. COMPARISON:  None Available. FINDINGS: MRI HEAD FINDINGS Brain: Mild restricted diffusion the right insula. A few small foci of acute infarct in the overlying right frontal cortex and white matter. A few punctate acute infarcts in the cerebellum bilaterally. No substantial mass effect. No midline shift. No acute hemorrhage. No hydrocephalus. Cerebral atrophy. Vascular: See below. Skull and upper cervical spine: Normal marrow signal. Sinuses/Orbits: Clear sinuses.  No acute orbital findings. MRA HEAD FINDINGS Anterior circulation: Bilateral intracranial ICAs, MCAs and ACAs are patent without proximal hemodynamically significant stenosis. Right M1 MCA appears patent. Posterior circulation: Bilateral intradural vertebral arteries, basilar artery and bilateral posterior cerebral  arteries are patent. IMPRESSION: 1. Acute infarcts in the right insula, right frontal lobe and the cerebellum bilaterally. 2. No large vessel occlusion.  Right M1 MCA is now patent. Electronically Signed   By: Feliberto Harts M.D.   On: 04/19/2023 21:32   MR ANGIO HEAD WO CONTRAST Result Date: 04/19/2023 CLINICAL DATA:  Stroke, follow up EXAM: MRI HEAD WITHOUT CONTRAST MRA HEAD WITHOUT CONTRAST TECHNIQUE: Multiplanar, multi-echo pulse sequences of the brain and surrounding structures were acquired without intravenous contrast. Angiographic images of the Circle of Willis were acquired using MRA technique without intravenous contrast. COMPARISON:  None Available. FINDINGS: MRI HEAD FINDINGS Brain: Mild restricted diffusion the right insula. A few small foci of acute infarct in the overlying right frontal cortex and white matter. A few punctate acute infarcts in the cerebellum bilaterally. No substantial mass effect. No midline shift. No acute hemorrhage. No hydrocephalus. Cerebral atrophy. Vascular: See below. Skull and upper cervical spine: Normal marrow signal. Sinuses/Orbits: Clear sinuses.  No acute orbital findings. MRA  HEAD FINDINGS Anterior circulation: Bilateral intracranial ICAs, MCAs and ACAs are patent without proximal hemodynamically significant stenosis. Right M1 MCA appears patent. Posterior circulation: Bilateral intradural vertebral arteries, basilar artery and bilateral posterior cerebral arteries are patent. IMPRESSION: 1. Acute infarcts in the right insula, right frontal lobe and the cerebellum bilaterally. 2. No large vessel occlusion.  Right M1 MCA is now patent. Electronically Signed   By: Feliberto Harts M.D.   On: 04/19/2023 21:32   DG Abd 1 View Result Date: 04/19/2023 CLINICAL DATA:  Orogastric tube placement. EXAM: ABDOMEN - 1 VIEW COMPARISON:  None Available. FINDINGS: Distal tip of nasogastric tube is seen in expected position of stomach. No abnormal bowel dilatation. Possible  gastrostomy tube is noted. IMPRESSION: Distal tip of nasogastric tube is seen in expected position of stomach. Electronically Signed   By: Lupita Raider M.D.   On: 04/19/2023 08:38   Portable Chest x-ray Result Date: 04/19/2023 CLINICAL DATA:  Endotracheal tube. EXAM: PORTABLE CHEST 1 VIEW COMPARISON:  April 02, 2023. FINDINGS: Stable cardiomegaly. Endotracheal and nasogastric tubes are in grossly good position. Minimal right basilar subsegmental atelectasis is noted. Mild left retrocardiac opacity is noted concerning for atelectasis or infiltrate with probable small pleural effusion. Bony thorax is unremarkable. IMPRESSION: Support apparatus as noted above. Left retrocardiac opacity as noted above. Minimal right basilar atelectasis. Electronically Signed   By: Lupita Raider M.D.   On: 04/19/2023 08:36   CT ANGIO HEAD NECK W WO CM (CODE STROKE) Result Date: 04/19/2023 EXAM: CT ANGIOGRAPHY HEAD AND NECK WITH AND WITHOUT CONTRAST TECHNIQUE: Multidetector CT imaging of the head and neck was performed using the standard protocol during bolus administration of intravenous contrast. Multiplanar CT image reconstructions and MIPs were obtained to evaluate the vascular anatomy. Carotid stenosis measurements (when applicable) are obtained utilizing NASCET criteria, using the distal internal carotid diameter as the denominator. RADIATION DOSE REDUCTION: This exam was performed according to the departmental dose-optimization program which includes automated exposure control, adjustment of the mA and/or kV according to patient size and/or use of iterative reconstruction technique. CONTRAST:  75mL OMNIPAQUE IOHEXOL 350 MG/ML SOLN COMPARISON:  CT head from today. FINDINGS: CTA NECK FINDINGS Aortic arch: Great vessel origins are patent without significant stenosis. Right carotid system: No evidence of dissection, stenosis (50% or greater), or occlusion. Mildly diminished opacification the right ICA in the neck likely  relates to the intracranial occlusion described below. Left carotid system: No evidence of dissection, stenosis (50% or greater), or occlusion. Vertebral arteries: Codominant. No evidence of dissection, stenosis (50% or greater), or occlusion. Skeleton: No acute abnormality on limited assessment. Other neck: No acute abnormality on limited assessment. Upper chest: Visualized lung apices are clear. Review of the MIP images confirms the above findings CTA HEAD FINDINGS Anterior circulation: Bilateral intracranial ICAs are patent without significant stenosis. Occlusion versus critical stenosis of the right M1 MCA with asymmetrically diminished opacification of more distal right MCA vessels. Suspected thrombus is visualized within the M1 MCA and extends into proximal M2 MCA branches. Left MCAs and bilateral ACAs are patent. Posterior circulation: Bilateral intradural vertebral arteries, basilar artery and bilateral posterior arteries are patent without proximal in a mic least significant stenosis. Venous sinuses: Not well assessed due to arterial timing. Review of the MIP images confirms the above findings IMPRESSION: Occlusion versus critical stenosis of the right M1 MCA with asymmetrically diminished opacification of more distal right MCA vessels. Suspected thrombus is visualized within the M1 MCA and extends into proximal M2  MCA branches. Findings discussed with Dr. Otelia Limes via telephone at 2:26 a.m. Electronically Signed   By: Feliberto Harts M.D.   On: 04/19/2023 02:37   CT HEAD CODE STROKE WO CONTRAST Result Date: 04/19/2023 CLINICAL DATA:  Code stroke.  Neuro deficit, acute, stroke suspected EXAM: CT HEAD WITHOUT CONTRAST TECHNIQUE: Contiguous axial images were obtained from the base of the skull through the vertex without intravenous contrast. RADIATION DOSE REDUCTION: This exam was performed according to the departmental dose-optimization program which includes automated exposure control, adjustment of the  mA and/or kV according to patient size and/or use of iterative reconstruction technique. COMPARISON:  CT March 23, 2023. FINDINGS: Brain: No evidence of acute large vascular territory infarction, hemorrhage, hydrocephalus, extra-axial collection or mass lesion/mass effect. Vascular: No hyperdense vessel. Skull: No acute fracture. Sinuses/Orbits: Left sphenoid sinus frothy secretions. No acute orbital findings. Other: No mastoid effusions. ASPECTS [facility] Stroke Program Early CT Score) Total score (0-10 with 10 being normal): 10. IMPRESSION: No evidence of acute intracranial abnormality. Code stroke imaging results were communicated on 04/19/2023 at 2:18 am to provider Dr. Otelia Limes via secure text paging. Electronically Signed   By: Feliberto Harts M.D.   On: 04/19/2023 02:18   CT CHEST WO CONTRAST Result Date: 04/03/2023 CLINICAL DATA:  Chronic dyspnea with unclear etiology EXAM: CT CHEST WITHOUT CONTRAST TECHNIQUE: Multidetector CT imaging of the chest was performed following the standard protocol without IV contrast. RADIATION DOSE REDUCTION: This exam was performed according to the departmental dose-optimization program which includes automated exposure control, adjustment of the mA and/or kV according to patient size and/or use of iterative reconstruction technique. COMPARISON:  Radiograph from yesterday FINDINGS: Cardiovascular: Cardiac enlargement. Small low-density pericardial effusion measuring up to 9 mm in thickness posteriorly. There is atheromatous calcification. Prominent mitral annular calcification. Lead less pacer at the right ventricular apex. Mediastinum/Nodes: No worrisome lymph node enlargement, mass, or esophageal thickening. Lungs/Pleura: Biapical pleural based scarring. Central airways are clear. Minor dependent atelectasis. There is no consolidation, effusion, or pneumothorax. Slight interlobular septal thickening best seen on coronal reformats. Upper Abdomen: Cholecystectomy clips and  atheromatous calcification. Musculoskeletal: T11 compression fracture with moderate height loss, nonacute and known from 2023 MRI. IMPRESSION: 1. Slight thickening of the interstitium may be minimal pulmonary edema. There is chronic cardiomegaly with small pericardial effusion. 2. Negative for pneumonia. Electronically Signed   By: Tiburcio Pea M.D.   On: 04/03/2023 07:28   DG Chest 2 View Result Date: 04/02/2023 CLINICAL DATA:  Dyspnea, atrial fibrillation EXAM: CHEST - 2 VIEW COMPARISON:  03/23/2023 FINDINGS: The lungs are symmetrically well expanded. Small left pleural effusion is present. Chronic interstitial thickening again noted though superimposed interstitial pulmonary edema has resolved. No pneumothorax. Stable cardiomegaly. Leadless pacemaker again noted. No acute bone abnormality. IMPRESSION: 1. Small left pleural effusion. 2. Cardiomegaly. 3. Resolved pulmonary edema Electronically Signed   By: Helyn Numbers M.D.   On: 04/02/2023 19:41   DG Chest 1 View Result Date: 03/23/2023 CLINICAL DATA:  89 year old female with altered mental status, CHF. EXAM: CHEST  1 VIEW COMPARISON:  Portable chest yesterday and earlier. FINDINGS: Portable AP semi upright view at 0900 hours. Mildly improved lung volumes. Stable cardiomegaly and mediastinal contours. Left chest loop recorder or superficial ICD redemonstrated. Small pleural effusions are less apparent, lung base ventilation mildly improved. Stable pulmonary vascularity which has regressed since last month. No pneumothorax. No areas of worsening ventilation. Stable visualized osseous structures. Negative visible bowel gas. IMPRESSION: Cardiomegaly with mildly improved lung volumes and  lung base ventilation. Suspected small pleural effusions less apparent. Stable vascular congestion, improved from last month. Electronically Signed   By: Odessa Fleming M.D.   On: 03/23/2023 09:20   CT HEAD WO CONTRAST ( ) Result Date: 03/23/2023 CLINICAL DATA:  Altered mental  status EXAM: CT HEAD WITHOUT CONTRAST TECHNIQUE: Contiguous axial images were obtained from the base of the skull through the vertex without intravenous contrast. RADIATION DOSE REDUCTION: This exam was performed according to the departmental dose-optimization program which includes automated exposure control, adjustment of the mA and/or kV according to patient size and/or use of iterative reconstruction technique. COMPARISON:  None Available. FINDINGS: Brain: There is no mass, hemorrhage or extra-axial collection. There is generalized atrophy without lobar predilection. Hypodensity of the white matter is most commonly associated with chronic microvascular disease. Vascular: No hyperdense vessel or unexpected vascular calcification. Skull: The visualized skull base, calvarium and extracranial soft tissues are normal. Sinuses/Orbits: No fluid levels or advanced mucosal thickening of the visualized paranasal sinuses. No mastoid or middle ear effusion. Normal orbits. Other: None. IMPRESSION: 1. No acute intracranial abnormality. 2. Generalized atrophy and findings of chronic microvascular disease. Electronically Signed   By: Deatra Robinson M.D.   On: 03/23/2023 03:12   DG Chest Port 1 View Result Date: 03/22/2023 CLINICAL DATA:  Cough EXAM: PORTABLE CHEST 1 VIEW COMPARISON:  03/14/2023 FINDINGS: Moderate cardiomegaly with multifocal interstitial opacity, unchanged. No pleural effusion or pneumothorax. No focal airspace consolidation. IMPRESSION: Moderate cardiomegaly with multifocal interstitial opacity, unchanged and likely chronic. Electronically Signed   By: Deatra Robinson M.D.   On: 03/22/2023 22:36     PHYSICAL EXAM  Temp:  [98.1 F (36.7 C)-98.7 F (37.1 C)] 98.1 F (36.7 C) (03/06 1152) Pulse Rate:  [50-73] 50 (03/06 1000) Resp:  [9-24] 12 (03/06 1000) BP: (90-137)/(41-98) 108/49 (03/06 1000) SpO2:  [91 %-99 %] 97 % (03/06 1000)  General - Well nourished, well developed, not in acute  distress  Ophthalmologic - fundi not visualized due to noncooperation.  Cardiovascular - irregularly irregular heart rate and rhythm.  Neuro - awake, alert, eyes open, orientated to place time and people and age. No gaze palsy, tracking bilaterally, visual field full, PERRL. Facial symmetric. Tongue midline. Bilateral UEs 4/5, no drift. Bilaterally LEs 3/5. Sensation symmetric and b/l FTN grossly intact, gait not tested.     ASSESSMENT/PLAN Ms. MARNELL MCDANIEL is a 89 y.o. female with history of A-fib on Xarelto, pacemaker, recent COVID infection, HF PEF, hypertension, PE 2019, SVT, stroke admitted for left-sided weakness and aphasia. No TNK given due to on Xarelto.    Stroke: right MCA and cerebellar infarcts right M1 and ACA occlusions/p IR with TICI3 and TICI2c respectively, embolic secondary to A-fib even on Xarelto CT no acute abnormality CT head and neck right M1 occlusion with distal reconstitution IR showed right MCA and ACA occlusion status post TICI3 MCA and TICI2c ACA. MRI acute infarcts in the right insula, right frontal lobe and cerebellum bilaterally MRA right M1 MCA now patent 2D Echo pending report LDL 143 HgbA1c 6.2 UDS negative Lovenox for VTE prophylaxis Xarelto (rivaroxaban) daily prior to admission, now on aspirin 300 mg suppository daily.  Will switch to Eliquis  Ongoing aggressive stroke risk factor management Therapy recommendations: CIR Disposition: Pending  A-fib On Xarelto PTA Currently on aspirin Rate controlled Switch ASA to Eliquis today   History of stroke 10/2020 admitted for left CR infarct with left ICA and MCA occlusion status post IR with TICI3.  CT  head and neck T occlusion with recon at left MCA and ACA.  EF 40 to 45%, LDL 81, A1c 5.8.  Continue on Xarelto but recommend taking Xarelto with food  Respiratory failure Intubated for procedure Remained intubated postprocedure Extubate 04/19/23 Tolerating well  Hypertension Stable Off  Cleviprex Long term BP goal normotensive  Hyperlipidemia Home meds: None LDL 143, goal < 70 Statin allergy Start Zetia today May consider Leqvio  Other Stroke Risk Factors Advanced age SVT PE in 2019 HFpEF  Other Active Problems Pacemaker present History of COVID infection AKI, creatinine 1.40--1.39  Hospital day # 1  This patient is critically ill due to stroke status post thrombectomy, A-fib, respiratory failure and at significant risk of neurological worsening, death form recurrent stroke, hemorrhagic transformation, heart failure. This patient's care requires constant monitoring of vital signs, hemodynamics, respiratory and cardiac monitoring, review of multiple databases, neurological assessment, discussion with family, other specialists and medical decision making of high complexity. I spent 40 minutes of neurocritical care time in the care of this patient. I had long discussion with daughter at bedside, updated pt current condition, treatment plan and potential prognosis, and answered all the questions.  She expressed understanding and appreciation.    Marvel Plan, MD PhD Stroke Neurology 04/20/2023 11:55 AM    To contact Stroke Continuity provider, please refer to [email or web address]. After hours, contact General Neurology

## 2023-04-20 NOTE — Progress Notes (Signed)
 PHARMACY - ANTICOAGULATION CONSULT NOTE  Pharmacy Consult for apixban Indication: atrial fibrillation  Allergies  Allergen Reactions   Statins Hives    Severe myalgias    Patient Measurements: Height: 5\' 3"  (160 cm) Weight: 47.6 kg (104 lb 15 oz) IBW/kg (Calculated) : 52.4  Vital Signs: Temp: 98.1 F (36.7 C) (03/06 1152) Temp Source: Axillary (03/06 1152) BP: 121/47 (03/06 1300) Pulse Rate: 55 (03/06 1300)  Labs: Recent Labs    04/19/23 0208 04/19/23 0209 04/19/23 0421 04/19/23 0606 04/20/23 0604  HGB 13.5 14.6  --  11.9* 12.0  HCT 42.2 43.0  --  35.0* 38.4  PLT 210  --   --   --  216  APTT 48*  --   --   --   --   LABPROT 42.1*  --  46.1*  --   --   INR 4.4*  --  4.9*  --   --   CREATININE 1.33* 1.40*  --   --  1.39*    Estimated Creatinine Clearance: 20.2 mL/min (A) (by C-G formula based on SCr of 1.39 mg/dL (H)).   Medical History: Past Medical History:  Diagnosis Date   Arthritis    Atrial fibrillation (HCC)    Benign breast cyst in female, left 10/07/2016   Candida esophagitis (HCC) 05/03/2021   Found on Mar 10 2021 EGD    CAP (community acquired pneumonia) 02/15/2021   CHF (congestive heart failure) (HCC)    Colon adenomas    GERD (gastroesophageal reflux disease)    HCAP (healthcare-associated pneumonia) 02/02/2021   Hypertension    Hypothyroidism    Macular degeneration    Mitral regurgitation    Pulmonary embolism (HCC) 02/2016   Severe sepsis (HCC) 02/02/2021   Stroke (HCC) 10/2020   SVT (supraventricular tachycardia) (HCC)      Assessment: 89 yo F with afib on xarelto PTA with history of DVTs in the past. Pt presented with stroke s/p IR during this admission. Pharmacy consulted for eliquis for anticoagulation for longterm management.  Goal of Therapy:  Monitor platelets by anticoagulation protocol: Yes   Plan:  Apixaban 2.5mg  BID  -89 yo ; 47 kg - meets criteria for dose reduction F/u s/sx bleeding, CBC as applicable   Calton Dach, PharmD, BCCCP Clinical Pharmacist 04/20/2023 3:26 PM

## 2023-04-20 NOTE — Evaluation (Signed)
 Modified Barium Swallow Study  Patient Details  Name: Frances Kent MRN: [id number] Date of Birth: 1930-05-26  Today's Date: 04/20/2023  Modified Barium Swallow completed.  Full report located under Chart Review in the Imaging Section.  History of Present Illness Frances Kent is a 89 yo female presenting to ED 3/5 with acute onset of aphasia and L sided weakness. MRI - Acute infarcts in the right insula, right frontal lobe and the cerebellum bilaterally. Status post IR revascularization 3/5.  MBS 02/2021 - generally functional swallow with some oral holding but no aspiration, no residue. Pt previously seen by SLP in [facility] for cognitive impairments (problem solving, divergent naming, memory retrieval) s/p L basal ganglia CVA 2022. PMH includes A-fib, pacemaker, arthritis, recent COVID infection, GERD, HFrEF, HTN, macular degeneration, hypothyroidism, mitral regurgitation, PE (2019), prior CVA without residual deficits, SVT   Clinical Impression Pt presents with very functional swallowing marked only by occasional oral holding and portioning of bolus that she allows to move into the pharynx a bit at a time.  Laryngeal vestibule closure was reliable with no penetration nor aspiration. Pharyngeal stripping and base of tongue retraction were strong, leading to effective clearance of material through the pharynx. Pt declined to swallow the barium pill, stating that she breaks her pills up at baseline.  Recommend resuming a regular diet, thin liquids. She was told several years ago that she should not use straws - today she drank from a straw normally without any concerns.  Crush meds.Will need assist with feeding due to tremors. No further SLP f/u for swallowing is needed. Conveyed results to RN> Factors that may increase risk of adverse event in presence of aspiration Rubye Oaks & Clearance Coots 2021):    Swallow Evaluation Recommendations Recommendations: PO diet PO Diet Recommendation: Regular;Thin  liquids (Level 0) Liquid Administration via: Cup;Straw Medication Administration: Crushed with puree Supervision: Patient able to self-feed;Staff to assist with self-feeding Oral care recommendations: Oral care BID (2x/day)    Emmelyn Schmale L. Samson Frederic, MA CCC/SLP Clinical Specialist - Acute Care SLP Acute Rehabilitation Services Office number [phone]   Blenda Mounts Laurice 04/20/2023,2:22 PM

## 2023-04-20 NOTE — Progress Notes (Signed)
 IP rehab admissions - I met with patient and her daughter, [name], at the bedside.  Patient prefers to discharge directly home and not come to CIR if possible.  I discussed CIR with them and gave them pamphlets.  Patient is willing for me to seek insurance authorization in case she needs CIR.  We will open the case today and will update all once we hear back from insurance case manager.  [phone]

## 2023-04-20 NOTE — Progress Notes (Signed)
 Physical Therapy Treatment Patient Details Name: Frances Kent MRN: [id number] DOB: [birth year] Today's Date: 04/20/2023   History of Present Illness 89 y.o. female presents to [facility] hospital on 04/19/2023 with aphasia and L weakness. CTA with critical stenosis of R M1 MCA. Pt underwent mechanical thrombectomy, remaining intubated post-procedure. Extubated 3/5. Transfer to floor on 3/6. PMH includes afib, PPM, HFrEF, HTN, hypothyroidism, prior CVA with residual R weakness.    PT Comments  Pt received in supine, agreeable to therapy session after recent transfer from other unit, pt with good effort and improved tolerance for transfer and standing activity this date. Pt able to perform short gait trial at bedside, distance limited due to pt c/o fatigue and per daughter she had not eaten yet today, food recently arrived so pt agreeable to eat with bed in chair posture at end of session although she reports not much appetite. Plan to progress gait trial with chair follow for safety next session. Patient will benefit from intensive inpatient follow-up therapy, >3 hours/day, pt making quick progress toward PT goals and anticipate she will benefit from aggressive therapies daily as she lives alone at baseline.     If plan is discharge home, recommend the following: Two people to help with walking and/or transfers;Two people to help with bathing/dressing/bathroom;Assistance with cooking/housework;Direct supervision/assist for medications management;Direct supervision/assist for financial management;Assist for transportation;Help with stairs or ramp for entrance   Can travel by private vehicle        Equipment Recommendations  Wheelchair (measurements PT);Wheelchair cushion (measurements PT);Hospital bed (above recs pending progress)    Recommendations for Other Services Rehab consult     Precautions / Restrictions Precautions Precautions: Fall Restrictions Weight Bearing Restrictions Per Provider Order:  No     Mobility  Bed Mobility Overal bed mobility: Needs Assistance Bed Mobility: Supine to Sit, Sit to Supine     Supine to sit: Min assist, HOB elevated, Used rails Sit to supine: Min assist, Used rails   General bed mobility comments: Good initiation once cued, pt needs cues to sequence task and using RUE to pull trunk forward/toward her L side toward EOB then help getting onto L elbow and then able to initiate pushing into sitting; BLE assist to return to supine.    Transfers Overall transfer level: Needs assistance Equipment used: Rolling walker (2 wheels) Transfers: Sit to/from Stand Sit to Stand: Min assist           General transfer comment: cues for sequencing/UE placement, minA lift and steadying assist and pt able to use BUE on RW handles to steady herself.    Ambulation/Gait Ambulation/Gait assistance: Min assist, Mod assist Gait Distance (Feet): 7 Feet Assistive device: Rolling walker (2 wheels) Gait Pattern/deviations: Step-to pattern, Decreased stride length, Decreased dorsiflexion - right, Decreased dorsiflexion - left, Narrow base of support Gait velocity: limited, grossly <0.2 m/s   Pre-gait activities: standing hip flexion x10 reps, does fairly well with marching prior to gait but during gait very limited hip flexion despite cues General Gait Details: Dense cues for step length/height, very low foot clearance despite cues and poor bil dorsiflexion, pt also needs assist for RW management but RW in her room is heavy/bariatric width so would benefit from youth height RW next session to see if it helps her to manage AD better. Up to [facility] for backward stepping last few feet due to pt c/o fatigue and difficulty managing heavier RW.   Stairs  Wheelchair Mobility     Tilt Bed    Modified Rankin (Stroke Patients Only)       Balance Overall balance assessment: Needs assistance Sitting-balance support: Feet supported, Bilateral upper  extremity supported Sitting balance-Leahy Scale: Fair Sitting balance - Comments: CGA to supervision for sitting EOB, less lean today   Standing balance support: During functional activity, Bilateral upper extremity supported, Reliant on assistive device for balance Standing balance-Leahy Scale: Poor Standing balance comment: minA with RW                            Communication Communication Communication: No apparent difficulties  Cognition Arousal: Alert Behavior During Therapy: WFL for tasks assessed/performed   PT - Cognitive impairments: Memory, Sequencing, Problem solving, Safety/Judgement                       PT - Cognition Comments: A&O to self, location, situation, pt with some slower processing and needs cues for safety/sequencing throughout. Following commands: Intact      Cueing Cueing Techniques: Verbal cues, Visual cues  Exercises Other Exercises Other Exercises: standing hip flexion x10 reps at RW    General Comments General comments (skin integrity, edema, etc.): foam dressing over sacrum; pt has briefs and purewick on. Pt daughter asking about SLP consult but per chart review they already worked with her today.     Pertinent Vitals/Pain Pain Assessment Pain Assessment: No/denies pain    Home Living Family/patient expects to be discharged to:: Private residence Living Arrangements: Alone                      Prior Function            PT Goals (current goals can now be found in the care plan section) Acute Rehab PT Goals Patient Stated Goal: to return to walking PT Goal Formulation: With patient/family Time For Goal Achievement: 05/03/23 Progress towards PT goals: Progressing toward goals    Frequency    Min 3X/week      PT Plan      Co-evaluation              AM-PAC PT "6 Clicks" Mobility   Outcome Measure  Help needed turning from your back to your side while in a flat bed without using bedrails?: A  Little Help needed moving from lying on your back to sitting on the side of a flat bed without using bedrails?: A Lot Help needed moving to and from a bed to a chair (including a wheelchair)?: A Lot Help needed standing up from a chair using your arms (e.g., wheelchair or bedside chair)?: A Lot Help needed to walk in hospital room?: Total (<61ft) Help needed climbing 3-5 steps with a railing? : Total 6 Click Score: 11    End of Session Equipment Utilized During Treatment: Gait belt Activity Tolerance: Patient tolerated treatment well Patient left: in bed;with call bell/phone within reach;with bed alarm set;with family/visitor present;Other (comment);with SCD's reapplied (bed in chair posture, pt daughter present assisting her to set up her dinner tray) Nurse Communication: Mobility status PT Visit Diagnosis: Other abnormalities of gait and mobility (R26.89);Muscle weakness (generalized) (M62.81)     Time: 1610-9604 PT Time Calculation (min) (ACUTE ONLY): 22 min  Charges:    $Gait Training: 8-22 mins PT General Charges $$ ACUTE PT VISIT: 1 Visit  Florina Ou., PTA Acute Rehabilitation Services Secure Chat Preferred 9a-5:30pm Office: [phone]    Dorathy Kinsman [facility] 04/20/2023, 6:32 PM

## 2023-04-20 NOTE — Plan of Care (Signed)
  Problem: Education: Goal: Knowledge of disease or condition will improve Outcome: Progressing Goal: Knowledge of secondary prevention will improve (MUST DOCUMENT ALL) Outcome: Progressing Goal: Knowledge of patient specific risk factors will improve (DELETE if not current risk factor) Outcome: Progressing   Problem: Ischemic Stroke/TIA Tissue Perfusion: Goal: Complications of ischemic stroke/TIA will be minimized Outcome: Progressing   Problem: Coping: Goal: Will verbalize positive feelings about self Outcome: Progressing Goal: Will identify appropriate support needs Outcome: Progressing   Problem: Health Behavior/Discharge Planning: Goal: Ability to manage health-related needs will improve Outcome: Progressing Goal: Goals will be collaboratively established with patient/family Outcome: Progressing   Problem: Self-Care: Goal: Ability to participate in self-care as condition permits will improve Outcome: Progressing Goal: Verbalization of feelings and concerns over difficulty with self-care will improve Outcome: Progressing Goal: Ability to communicate needs accurately will improve Outcome: Progressing   Problem: Nutrition: Goal: Risk of aspiration will decrease Outcome: Progressing Goal: Dietary intake will improve Outcome: Progressing   Problem: Education: Goal: Knowledge of General Education information will improve Description: Including pain rating scale, medication(s)/side effects and non-pharmacologic comfort measures Outcome: Progressing   Problem: Health Behavior/Discharge Planning: Goal: Ability to manage health-related needs will improve Outcome: Progressing   Problem: Clinical Measurements: Goal: Ability to maintain clinical measurements within normal limits will improve Outcome: Progressing Goal: Will remain free from infection Outcome: Progressing Goal: Diagnostic test results will improve Outcome: Progressing Goal: Respiratory complications will  improve Outcome: Progressing Goal: Cardiovascular complication will be avoided Outcome: Progressing   Problem: Activity: Goal: Risk for activity intolerance will decrease Outcome: Progressing   Problem: Nutrition: Goal: Adequate nutrition will be maintained Outcome: Progressing   Problem: Coping: Goal: Level of anxiety will decrease Outcome: Progressing   Problem: Elimination: Goal: Will not experience complications related to bowel motility Outcome: Progressing Goal: Will not experience complications related to urinary retention Outcome: Progressing   Problem: Pain Managment: Goal: General experience of comfort will improve and/or be controlled Outcome: Progressing   Problem: Safety: Goal: Ability to remain free from injury will improve Outcome: Progressing   Problem: Skin Integrity: Goal: Risk for impaired skin integrity will decrease Outcome: Progressing   Problem: Education: Goal: Understanding of CV disease, CV risk reduction, and recovery process will improve Outcome: Progressing Goal: Individualized Educational Video(s) Outcome: Progressing   Problem: Activity: Goal: Ability to return to baseline activity level will improve Outcome: Progressing   Problem: Cardiovascular: Goal: Ability to achieve and maintain adequate cardiovascular perfusion will improve Outcome: Progressing Goal: Vascular access site(s) Level 0-1 will be maintained Outcome: Progressing   Problem: Health Behavior/Discharge Planning: Goal: Ability to safely manage health-related needs after discharge will improve Outcome: Progressing   Problem: Activity: Goal: Ability to tolerate increased activity will improve Outcome: Progressing   Problem: Respiratory: Goal: Ability to maintain a clear airway and adequate ventilation will improve Outcome: Progressing   Problem: Role Relationship: Goal: Method of communication will improve Outcome: Progressing

## 2023-04-20 NOTE — Patient Outreach (Signed)
 Care Coordination   Follow Up Visit Note   04/20/2023 Name: Frances Kent MRN: [id number] DOB: [birth year]  Frances Kent is a 89 y.o. year old female who sees Darrick Huntsman, Mar Daring, MD for primary care. I  spoke with patient's daughter, Windell Hummingbird.   What matters to the patients health and wellness today?  Daughter states patient hospitalized on 04/19/23 due to stroke. She states patient underwent surgery for brain blood clot. She states patient continues to be in  ICU.   Daughter states she and her sister are primary caregivers for their mother/ patient. Daughter states she recently sustained a fall and fractured her knee. She states her sister will be assuming primary care while she is recuperating. Daughter states she knows patient will need additional care in the home after discharge from the hospital. Daughter states she is interested in looking further at palliative care / hospice care or other in home care resources for patient once discharged. She states she knows patient will also need physical/ occupational and/ or speech therapy.    Goals Addressed             This Visit's Progress    Continued improvement post hospitalization and managment of health conditions       Interventions Today    Flowsheet Row Most Recent Value  Chronic Disease   Chronic disease during today's visit Other  [stroke]  General Interventions   General Interventions Discussed/Reviewed General Interventions Reviewed, Communication with, Level of Care  [Active listening and support with daughter provided. Advised daughter to take notes on who is providing care, IE doctors, therapist, SW, case managers while patient is in the hospital to help stay organized and aware.]  Communication with --  [message sent to hospital liaison requesting patient to be followed for discharge disposition and to notify that daughter, Windell Hummingbird is requesting be evaluated for palliative or hospice upon discharge.]  Level of Care  --  [discussed possible care needs patient may have post hospital discharge.]  Education Interventions   Education Provided Provided Education  [explained to daughter that inpatient social worker/ case manager will also follow patient for discharge needs.]              SDOH assessments and interventions completed:  No     Care Coordination Interventions:  Yes, provided   Follow up plan:  patient in the hospital.  Will be followed by hospital liaison for discharge disposition.    Encounter Outcome:  Patient Visit Completed   George Ina RN, BSN, CCM [place]  [facility], Population Health Case Manager Phone: [phone]

## 2023-04-20 NOTE — Consult Note (Signed)
 NAME:  Frances Kent, MRN:  [id number], DOB:  13-May-1930, LOS: 1 ADMISSION DATE:  04/19/2023, CONSULTATION DATE: 3/5 REFERRING MD: Dr. Otelia Limes, CHIEF COMPLAINT: Stroke  History of Present Illness:  Patient is encephalopathic and/or intubated; therefore, history has been obtained from chart review.  89 year old female with past medical history as below, which is significant for atrial fibrillation on Xarelto, pacemaker, HFrEF, hypertension, hypothyroidism, and prior stroke with residual right-sided weakness.  She presented to [facility] emergency department on 3/5 with complaints of aphasia and left-sided weakness.  Code stroke was called and she was sent emergently for imaging.  Last known well at 2200 on 3/4.  CTA of the head showed occlusion versus critical stenosis of the right M1 MCA.  She was not a TNK candidate due to Xarelto.  She was taken to IR for mechanical aspiration of clot and received a TICI 2c result.  Postoperatively she was transferred to the neuro ICU and PCCM was asked to assist with ventilator management.  Of note she does carry a diagnosis of failure to thrive in the outpatient setting and her PCP is recently referred her to palliative care for many of her chronic issues.  Pertinent  Medical History   has a past medical history of Arthritis, Atrial fibrillation (HCC), Benign breast cyst in female, left (10/07/2016), Candida esophagitis (HCC) (05/03/2021), CAP (community acquired pneumonia) (02/15/2021), CHF (congestive heart failure) (HCC), Colon adenomas, GERD (gastroesophageal reflux disease), HCAP (healthcare-associated pneumonia) (02/02/2021), Hypertension, Hypothyroidism, Macular degeneration, Mitral regurgitation, Pulmonary embolism (HCC) (02/2016), Severe sepsis (HCC) (02/02/2021), Stroke (HCC) (10/2020), and SVT (supraventricular tachycardia) (HCC).   Significant Hospital Events: Including procedures, antibiotic start and stop dates in addition to other pertinent  events   3/6 Arrived in the ICU intubated. Extubated post-procedure later AM  Interim History / Subjective:  Extubated post-procedure later AM yesterday  Objective   Blood pressure (!) 124/49, pulse (!) 52, temperature 98.2 F (36.8 C), temperature source Oral, resp. rate (!) 9, height 5\' 3"  (1.6 m), weight 47.6 kg, SpO2 97%.        Intake/Output Summary (Last 24 hours) at 04/20/2023 0851 Last data filed at 04/20/2023 0600 Gross per 24 hour  Intake 940.74 ml  Output 500 ml  Net 440.74 ml   Filed Weights   04/19/23 0200  Weight: 47.6 kg   Physical Exam: General: Elderly-appearing, no acute distress HENT: [place], AT, OP clear, MMM Eyes: EOMI, no scleral icterus Respiratory: Clear to auscultation bilaterally.  No crackles, wheezing or rales Cardiovascular: RRR, -M/R/G, no JVD GI: BS+, soft, nontender Extremities:-Edema,-tenderness Neuro: AAO x4, CNII-XII grossly intact, RUE and RLE 4/5 and LUE and LLE 5/5  Imaging, labs and test noted above have been reviewed independently by me. K 5.7 BUN/Cr 16/1.39  Resolved Hospital Problem list     Assessment & Plan:   CVA: Right M1 segment status post IR revascularization. Prior stroke with residual right-sided weakness per RN report -Keep systolic blood pressure between 120 and 140 -Management otherwise per stroke service - Echo -Cleviprex -MRI if pacemaker is compatible.   Endotracheal tube present in the postoperative setting -Extubated yesterday -Wean supplemental O2 for goal >88%  Atrial fibrillation: Chronic HFrEF: Most recent echo with EF 40 to 45%. Essential hypertension -Telemetry monitoring -Holding anticoagulation for now -Looks to be euvolemic on exam. -Holding home furosemide and metoprolol while needing tight BP control  Hypothyroid -Synthroid per tube  CKD stage IIIb: Creatinine is near baseline -Trend chemistry  Failure to thrive Moderate malnutrition -Outpatient referral  to palliative care was  pending -Have not been able to engage family this morning, request day team to follow-up.  Pulmonary team available as needed  Best Practice (right click and "Reselect all SmartList Selections" daily)   Diet/type: NPO DVT prophylaxis SCD Pressure ulcer(s): N/A GI prophylaxis: H2B Lines: N/A Foley:  N/A Code Status:  full code Last date of multidisciplinary goals of care discussion [ ]   Critical care time: N/a     Care Time: 50 min  Mechele Collin, M.D. [facility] Pulmonary/Critical Care Medicine 04/20/2023 8:52 AM   Please see Amion for pager number to reach on-call Pulmonary and Critical Care Team.

## 2023-04-21 ENCOUNTER — Other Ambulatory Visit: Payer: Self-pay | Admitting: Neurology

## 2023-04-21 ENCOUNTER — Inpatient Hospital Stay (HOSPITAL_COMMUNITY)
Admission: AD | Admit: 2023-04-21 | Discharge: 2023-04-26 | DRG: 057 | Disposition: A | Discharge status: Home Health | Source: Intra-hospital | Attending: Physical Medicine & Rehabilitation | Admitting: Physical Medicine & Rehabilitation

## 2023-04-21 ENCOUNTER — Encounter (HOSPITAL_COMMUNITY): Payer: Self-pay | Admitting: Physical Medicine & Rehabilitation

## 2023-04-21 ENCOUNTER — Other Ambulatory Visit: Payer: Self-pay

## 2023-04-21 DIAGNOSIS — I69351 Hemiplegia and hemiparesis following cerebral infarction affecting right dominant side: Secondary | ICD-10-CM | POA: Diagnosis not present

## 2023-04-21 DIAGNOSIS — I63511 Cerebral infarction due to unspecified occlusion or stenosis of right middle cerebral artery: Principal | ICD-10-CM | POA: Diagnosis present

## 2023-04-21 DIAGNOSIS — Z79899 Other long term (current) drug therapy: Secondary | ICD-10-CM

## 2023-04-21 DIAGNOSIS — N1831 Chronic kidney disease, stage 3a: Secondary | ICD-10-CM | POA: Diagnosis not present

## 2023-04-21 DIAGNOSIS — E785 Hyperlipidemia, unspecified: Secondary | ICD-10-CM | POA: Diagnosis not present

## 2023-04-21 DIAGNOSIS — I4811 Longstanding persistent atrial fibrillation: Secondary | ICD-10-CM | POA: Diagnosis not present

## 2023-04-21 DIAGNOSIS — K219 Gastro-esophageal reflux disease without esophagitis: Secondary | ICD-10-CM | POA: Diagnosis present

## 2023-04-21 DIAGNOSIS — R531 Weakness: Secondary | ICD-10-CM | POA: Diagnosis not present

## 2023-04-21 DIAGNOSIS — E039 Hypothyroidism, unspecified: Secondary | ICD-10-CM | POA: Diagnosis not present

## 2023-04-21 DIAGNOSIS — I6601 Occlusion and stenosis of right middle cerebral artery: Secondary | ICD-10-CM

## 2023-04-21 DIAGNOSIS — Z8673 Personal history of transient ischemic attack (TIA), and cerebral infarction without residual deficits: Secondary | ICD-10-CM | POA: Diagnosis present

## 2023-04-21 DIAGNOSIS — I69354 Hemiplegia and hemiparesis following cerebral infarction affecting left non-dominant side: Principal | ICD-10-CM

## 2023-04-21 DIAGNOSIS — H3530 Unspecified macular degeneration: Secondary | ICD-10-CM | POA: Diagnosis present

## 2023-04-21 DIAGNOSIS — Z950 Presence of cardiac pacemaker: Secondary | ICD-10-CM | POA: Diagnosis not present

## 2023-04-21 DIAGNOSIS — Z8249 Family history of ischemic heart disease and other diseases of the circulatory system: Secondary | ICD-10-CM | POA: Diagnosis not present

## 2023-04-21 DIAGNOSIS — Z86711 Personal history of pulmonary embolism: Secondary | ICD-10-CM | POA: Diagnosis not present

## 2023-04-21 DIAGNOSIS — I639 Cerebral infarction, unspecified: Secondary | ICD-10-CM

## 2023-04-21 DIAGNOSIS — I4891 Unspecified atrial fibrillation: Secondary | ICD-10-CM | POA: Diagnosis not present

## 2023-04-21 DIAGNOSIS — I130 Hypertensive heart and chronic kidney disease with heart failure and stage 1 through stage 4 chronic kidney disease, or unspecified chronic kidney disease: Secondary | ICD-10-CM | POA: Diagnosis present

## 2023-04-21 DIAGNOSIS — Z8616 Personal history of COVID-19: Secondary | ICD-10-CM

## 2023-04-21 DIAGNOSIS — Z7901 Long term (current) use of anticoagulants: Secondary | ICD-10-CM | POA: Diagnosis not present

## 2023-04-21 DIAGNOSIS — Z823 Family history of stroke: Secondary | ICD-10-CM

## 2023-04-21 DIAGNOSIS — Z79890 Hormone replacement therapy (postmenopausal): Secondary | ICD-10-CM

## 2023-04-21 DIAGNOSIS — Z888 Allergy status to other drugs, medicaments and biological substances status: Secondary | ICD-10-CM | POA: Diagnosis not present

## 2023-04-21 DIAGNOSIS — L89152 Pressure ulcer of sacral region, stage 2: Secondary | ICD-10-CM | POA: Diagnosis present

## 2023-04-21 DIAGNOSIS — I5032 Chronic diastolic (congestive) heart failure: Secondary | ICD-10-CM | POA: Diagnosis not present

## 2023-04-21 LAB — CBC
HCT: 34.8 % — ABNORMAL LOW (ref 36.0–46.0)
Hemoglobin: 11 g/dL — ABNORMAL LOW (ref 12.0–15.0)
MCH: 28.6 pg (ref 26.0–34.0)
MCHC: 31.6 g/dL (ref 30.0–36.0)
MCV: 90.4 fL (ref 80.0–100.0)
Platelets: 167 10*3/uL (ref 150–400)
RBC: 3.85 MIL/uL — ABNORMAL LOW (ref 3.87–5.11)
RDW: 16.7 % — ABNORMAL HIGH (ref 11.5–15.5)
WBC: 6.9 10*3/uL (ref 4.0–10.5)
nRBC: 0 % (ref 0.0–0.2)

## 2023-04-21 LAB — BASIC METABOLIC PANEL
Anion gap: 7 (ref 5–15)
BUN: 21 mg/dL (ref 8–23)
CO2: 24 mmol/L (ref 22–32)
Calcium: 8.6 mg/dL — ABNORMAL LOW (ref 8.9–10.3)
Chloride: 109 mmol/L (ref 98–111)
Creatinine, Ser: 1.38 mg/dL — ABNORMAL HIGH (ref 0.44–1.00)
GFR, Estimated: 36 mL/min — ABNORMAL LOW (ref >60)
Glucose, Bld: 86 mg/dL (ref 70–99)
Potassium: 4.5 mmol/L (ref 3.5–5.1)
Sodium: 140 mmol/L (ref 135–145)

## 2023-04-21 MED ORDER — DOCUSATE SODIUM 50 MG/5ML PO LIQD: 100.0000 mg | Freq: Two times a day (BID) | ORAL | Status: DC

## 2023-04-21 MED ORDER — SENNOSIDES-DOCUSATE SODIUM 8.6-50 MG PO TABS
1.0000 | ORAL_TABLET | Freq: Every evening | ORAL | Status: DC | PRN
  Administered 2023-04-23: 1 via ORAL
  Filled 2023-04-21: qty 1

## 2023-04-21 MED ORDER — EZETIMIBE 10 MG PO TABS: 10.0000 mg | ORAL_TABLET | Freq: Every day | ORAL | Status: DC

## 2023-04-21 MED ORDER — APIXABAN 2.5 MG PO TABS
2.5000 mg | ORAL_TABLET | Freq: Two times a day (BID) | ORAL | Status: DC
Start: 2023-04-21 — End: 2023-04-26
  Administered 2023-04-21 – 2023-04-26 (×10): 2.5 mg via ORAL
  Filled 2023-04-21 (×10): qty 1

## 2023-04-21 MED ORDER — APIXABAN 2.5 MG PO TABS: 2.5000 mg | ORAL_TABLET | Freq: Two times a day (BID) | ORAL | Status: DC

## 2023-04-21 MED ORDER — LEVOTHYROXINE SODIUM 50 MCG PO TABS: 50.0000 ug | ORAL_TABLET | Freq: Every day | ORAL | Status: DC

## 2023-04-21 MED ORDER — POLYETHYLENE GLYCOL 3350 17 G PO PACK: 17.0000 g | PACK | Freq: Every day | ORAL | Status: DC

## 2023-04-21 MED ORDER — PANTOPRAZOLE SODIUM 40 MG PO TBEC
40.0000 mg | DELAYED_RELEASE_TABLET | Freq: Every day | ORAL | Status: DC
Start: 2023-04-22 — End: 2023-04-26

## 2023-04-21 MED ORDER — SENNOSIDES-DOCUSATE SODIUM 8.6-50 MG PO TABS: 1.0000 | ORAL_TABLET | Freq: Every evening | ORAL | Status: DC | PRN

## 2023-04-21 MED ORDER — ACETAMINOPHEN 325 MG PO TABS: 650.0000 mg | ORAL_TABLET | ORAL | Status: DC | PRN

## 2023-04-21 MED ORDER — ACETAMINOPHEN 650 MG RE SUPP: 650.0000 mg | RECTAL | Status: DC | PRN

## 2023-04-21 MED ORDER — ALBUTEROL SULFATE (2.5 MG/3ML) 0.083% IN NEBU: 2.5000 mg | INHALATION_SOLUTION | RESPIRATORY_TRACT | Status: DC | PRN

## 2023-04-21 MED ORDER — PANTOPRAZOLE SODIUM 40 MG PO TBEC
40.0000 mg | DELAYED_RELEASE_TABLET | Freq: Every day | ORAL | Status: DC
  Administered 2023-04-21: 40 mg via ORAL
  Filled 2023-04-21: qty 1

## 2023-04-21 MED ORDER — DOCUSATE SODIUM 100 MG PO CAPS
100.0000 mg | ORAL_CAPSULE | Freq: Two times a day (BID) | ORAL | Status: DC
  Administered 2023-04-21 – 2023-04-23 (×3): 100 mg via ORAL
  Filled 2023-04-21 (×5): qty 1

## 2023-04-21 MED ORDER — POLYETHYLENE GLYCOL 3350 17 G PO PACK
17.0000 g | PACK | Freq: Every day | ORAL | Status: DC
  Administered 2023-04-22 – 2023-04-26 (×3): 17 g via ORAL
  Filled 2023-04-21 (×5): qty 1

## 2023-04-21 MED ORDER — ACETAMINOPHEN 160 MG/5ML PO SOLN: 650.0000 mg | ORAL | Status: DC | PRN

## 2023-04-21 MED ORDER — PANTOPRAZOLE SODIUM 40 MG PO TBEC
40.0000 mg | DELAYED_RELEASE_TABLET | Freq: Every day | ORAL | Status: DC
  Administered 2023-04-22 – 2023-04-26 (×5): 40 mg via ORAL
  Filled 2023-04-21 (×5): qty 1

## 2023-04-21 MED ORDER — LEVOTHYROXINE SODIUM 50 MCG PO TABS
50.0000 ug | ORAL_TABLET | Freq: Every day | ORAL | Status: DC
  Administered 2023-04-22 – 2023-04-26 (×5): 50 ug via ORAL
  Filled 2023-04-21 (×5): qty 1

## 2023-04-21 MED ORDER — EZETIMIBE 10 MG PO TABS
10.0000 mg | ORAL_TABLET | Freq: Every day | ORAL | Status: DC
  Administered 2023-04-22 – 2023-04-26 (×5): 10 mg via ORAL
  Filled 2023-04-21 (×5): qty 1

## 2023-04-21 NOTE — Discharge Instructions (Addendum)
 Information on my medicine - ELIQUIS (apixaban)  This medication education was reviewed with me or my healthcare representative as part of my discharge preparation.    Why was Eliquis prescribed for you? Eliquis was prescribed for you to reduce the risk of a blood clot forming that can cause a stroke if you have a medical condition called atrial fibrillation (a type of irregular heartbeat).  What do You need to know about Eliquis ? Take your Eliquis TWICE DAILY - one tablet in the morning and one tablet in the evening with or without food. If you have difficulty swallowing the tablet whole please discuss with your pharmacist how to take the medication safely.  Take Eliquis exactly as prescribed by your doctor and DO NOT stop taking Eliquis without talking to the doctor who prescribed the medication.  Stopping may increase your risk of developing a stroke.  Refill your prescription before you run out.  After discharge, you should have regular check-up appointments with your healthcare provider that is prescribing your Eliquis.  In the future your dose may need to be changed if your kidney function or weight changes by a significant amount or as you get older.  What do you do if you miss a dose? If you miss a dose, take it as soon as you remember on the same day and resume taking twice daily.  Do not take more than one dose of ELIQUIS at the same time to make up a missed dose.  Important Safety Information A possible side effect of Eliquis is bleeding. You should call your healthcare provider right away if you experience any of the following: Bleeding from an injury or your nose that does not stop. Unusual colored urine (red or dark brown) or unusual colored stools (red or black). Unusual bruising for unknown reasons. A serious fall or if you hit your head (even if there is no bleeding).  Some medicines may interact with Eliquis and might increase your risk of bleeding or clotting  while on Eliquis. To help avoid this, consult your healthcare provider or pharmacist prior to using any new prescription or non-prescription medications, including herbals, vitamins, non-steroidal anti-inflammatory drugs (NSAIDs) and supplements.  This website has more information on Eliquis (apixaban): http://www.eliquis.com/eliquis/home Inpatient Rehab Discharge Instructions  Frances Kent Discharge date and time: No discharge date for patient encounter.   Activities/Precautions/ Functional Status: Activity: As tolerated Diet: Regular Wound Care: Routine skin checks Functional status:  ___ No restrictions     ___ Walk up steps independently ___ 24/7 supervision/assistance   ___ Walk up steps with assistance ___ Intermittent supervision/assistance  ___ Bathe/dress independently ___ Walk with walker     _x__ Bathe/dress with assistance ___ Walk Independently    ___ Shower independently ___ Walk with assistance    ___ Shower with assistance ___ No alcohol     ___ Return to work/school ________  Special Instructions: No driving smoking or alcohol   My questions have been answered and I understand these instructions. I will adhere to these goals and the provided educational materials after my discharge from the hospital.  Patient/Caregiver Signature _______________________________ Date __________  Clinician Signature _______________________________________ Date __________  Please bring this form and your medication list with you to all your follow-up doctor's appointments. STROKE/TIA DISCHARGE INSTRUCTIONS SMOKING Cigarette smoking nearly doubles your risk of having a stroke & is the single most alterable risk factor  If you smoke or have smoked in the last 12 months, you are advised to quit  smoking for your health. Most of the excess cardiovascular risk related to smoking disappears within a year of stopping. Ask you doctor about anti-smoking medications [place] Quit Line: 1-800-QUIT  NOW Free Smoking Cessation Classes [phone]  CHOLESTEROL Know your levels; limit fat & cholesterol in your diet  Lipid Panel     Component Value Date/Time   CHOL 239 (H) 04/19/2023 0548   TRIG 87 04/20/2023 0604   HDL 82 04/19/2023 0548   CHOLHDL 2.9 04/19/2023 0548   VLDL 14 04/19/2023 0548   LDLCALC 143 (H) 04/19/2023 0548     Many patients benefit from treatment even if their cholesterol is at goal. Goal: Total Cholesterol (CHOL) less than 160 Goal:  Triglycerides (TRIG) less than 150 Goal:  HDL greater than 40 Goal:  LDL (LDLCALC) less than 100   BLOOD PRESSURE American Stroke Association blood pressure target is less that 120/80 mm/Hg  Your discharge blood pressure is:  BP: (!) 140/79 Monitor your blood pressure Limit your salt and alcohol intake Many individuals will require more than one medication for high blood pressure  DIABETES (A1c is a blood sugar average for last 3 months) Goal HGBA1c is under 7% (HBGA1c is blood sugar average for last 3 months)  Diabetes: No known diagnosis of diabetes    Lab Results  Component Value Date   HGBA1C 6.2 (H) 04/19/2023    Your HGBA1c can be lowered with medications, healthy diet, and exercise. Check your blood sugar as directed by your physician Call your physician if you experience unexplained or low blood sugars.  PHYSICAL ACTIVITY/REHABILITATION Goal is 30 minutes at least 4 days per week  Activity: Increase activity slowly, Therapies: Physical Therapy: Home Health Return to work:  Activity decreases your risk of heart attack and stroke and makes your heart stronger.  It helps control your weight and blood pressure; helps you relax and can improve your mood. Participate in a regular exercise program. Talk with your doctor about the best form of exercise for you (dancing, walking, swimming, cycling).  DIET/WEIGHT Goal is to maintain a healthy weight  Your discharge diet is:  Diet Order             Diet regular Room  service appropriate? Yes with Assist; Fluid consistency: Thin  Diet effective now                   liquids Your height is:  Height: 5\' 3"  (160 cm) Your current weight is: Weight: 45.3 kg Your Body Mass Index (BMI) is:  BMI (Calculated): 17.7 Following the type of diet specifically designed for you will help prevent another stroke. Your goal weight range is:   Your goal Body Mass Index (BMI) is 19-24. Healthy food habits can help reduce 3 risk factors for stroke:  High cholesterol, hypertension, and excess weight.  RESOURCES Stroke/Support Group:  Call [phone]   STROKE EDUCATION PROVIDED/REVIEWED AND GIVEN TO PATIENT Stroke warning signs and symptoms How to activate emergency medical system (call 911). Medications prescribed at discharge. Need for follow-up after discharge. Personal risk factors for stroke. Pneumonia vaccine given: No Flu vaccine given: No My questions have been answered, the writing is legible, and I understand these instructions.  I will adhere to these goals & educational materials that have been provided to me after my discharge from the hospital.

## 2023-04-21 NOTE — Progress Notes (Signed)
 Signed     Expand All Collapse All PMR Admission Coordinator Pre-Admission Assessment   Patient: Frances Kent is an 89 y.o., female MRN: [id number] DOB: 19-Oct-1930 Height: 5\' 3"  (160 cm) Weight: 47.6 kg   Insurance Information HMO: Yes    PPO:      PCP:      IPA:      80/20:      OTHER: Group [id number] PRIMARY: [employer] HMO      Policy#: [id number]      Subscriber: patient CM Name: Danella Penton      Phone#: [phone]     Fax#: [phone] Pre-Cert#: [id number]   Received approval from [place] on 04/21/23. Pt approved from 04/21/23-04/25/23.   Employer: Retired Benefits:  Phone #: [phone]     Name: online at [email or web address] Eff. Date: 02/15/23     Deduct: $0      Out of Pocket Max: [phone] (met $74.20)      Life Max: n/a CIR: $399/day copay for 7 days max $2793/admission      SNF: $10/day for days 1-20; $214/day for days 21-100 Outpatient:       Co-Pay: $25/visit Home Health: 100%      Co-Pay: none DME: 80%     Co-Pay: 20% Providers: in network  SECONDARY:       Policy#:      Phone#:    Artist:       Phone#:    The Data processing manager" for patients in Inpatient Rehabilitation Facilities with attached "Privacy Act Statement-Health Care Records" was provided and verbally reviewed with: Patient and Family   Emergency Contact Information Contact Information       Name Relation Home Work [place] D Daughter [phone]   [phone]    [facility] Daughter [phone]   [phone]         Other Contacts   None on File        Current Medical History  Patient Admitting Diagnosis: R CVA/B cerebellar CVA   History of Present Illness: A 89 y.o. female with a PMHx of atrial fibrillation (on Xarelto), pacemaker, arthritis, recent Covid infection, HFrEF, GERD, HTN, hypothyroidism, macular degeneration, mitral regurgitation, PE (2019), prior stroke without significant residual deficits and SVT who presents from  home to the [facility] ED on 04/19/23 via EMS as a Code Stroke for acute onset of aphasia and left sided weakness. Per family communication with EMS, the patient was LKN at 2220 when she was walking to a recliner. She got into the recliner and then was noted by family to be aphasic with left sided weakness. When EMS arrived they found same. EMS VS 169/97, placed on 4L [place] for comfort. CBG 131 on arrival.  MRI brain on 04/19/23 showed acute infarcts in the right insula, right frontal lobe and the cerebellum bilaterally.  Underwent IR thrombectomy by Dr. Corliss Skains on 04/19/23 and transferred to Neuro ICU post-op.  PT/OT/SLP evaluations completed with recommendations for acute inpatient rehab admission.   Complete NIHSS TOTAL: 0   Patient's medical record from [facility] has been reviewed by the rehabilitation admission coordinator and physician.   Past Medical History      Past Medical History:  Diagnosis Date   Arthritis     Atrial fibrillation (HCC)     Benign breast cyst in female, left 10/07/2016   Candida esophagitis (HCC) 05/03/2021    Found on Mar 10 2021 EGD    CAP (community acquired  pneumonia) 02/15/2021   CHF (congestive heart failure) (HCC)     Colon adenomas     GERD (gastroesophageal reflux disease)     HCAP (healthcare-associated pneumonia) 02/02/2021   Hypertension     Hypothyroidism     Macular degeneration     Mitral regurgitation     Pulmonary embolism (HCC) 02/2016   Severe sepsis (HCC) 02/02/2021   Stroke (HCC) 10/2020   SVT (supraventricular tachycardia) (HCC)            Has the patient had major surgery during 100 days prior to admission? Yes   Family History   family history includes Breast cancer (age of onset: 58) in her mother; Cancer (age of onset: 70) in her son; Cancer (age of onset: 69) in her mother; Heart disease in her son; Heart disease (age of onset: 38) in her maternal grandmother; Stroke in her sister.   Current  Medications  Current Medications    Current Facility-Administered Medications:    0.9 %  sodium chloride infusion, , Intravenous, Continuous, de Saintclair Halsted, Montier E, NP, Stopped at 04/20/23 1047   acetaminophen (TYLENOL) tablet 650 mg, 650 mg, Oral, Q4H PRN **OR** acetaminophen (TYLENOL) 160 MG/5ML solution 650 mg, 650 mg, Per Tube, Q4H PRN **OR** acetaminophen (TYLENOL) suppository 650 mg, 650 mg, Rectal, Q4H PRN, Caryl Pina, MD   albuterol (PROVENTIL) (2.5 MG/3ML) 0.083% nebulizer solution 2.5 mg, 2.5 mg, Nebulization, Q4H PRN, Caryl Pina, MD   aspirin suppository 300 mg, 300 mg, Rectal, Daily, Marvel Plan, MD, 300 mg at 04/20/23 9811   Chlorhexidine Gluconate Cloth 2 % PADS 6 each, 6 each, Topical, Daily, Caryl Pina, MD, 6 each at 04/20/23 0942   docusate (COLACE) 50 MG/5ML liquid 100 mg, 100 mg, Per Tube, BID, Duayne Cal, NP, 100 mg at 04/19/23 0903   enoxaparin (LOVENOX) injection 30 mg, 30 mg, Subcutaneous, Q24H, Marvel Plan, MD, 30 mg at 04/20/23 9147   levothyroxine (SYNTHROID) tablet 50 mcg, 50 mcg, Oral, Q0600, Caryl Pina, MD   Oral care mouth rinse, 15 mL, Mouth Rinse, PRN, Caryl Pina, MD   pantoprazole (PROTONIX) injection 40 mg, 40 mg, Intravenous, Q24H, Luciano Cutter, MD, 40 mg at 04/20/23 0809   polyethylene glycol (MIRALAX / GLYCOLAX) packet 17 g, 17 g, Per Tube, Daily, Duayne Cal, NP, 17 g at 04/19/23 8295   senna-docusate (Senokot-S) tablet 1 tablet, 1 tablet, Oral, QHS PRN, Caryl Pina, MD     Patients Current Diet:  Diet Order                  Diet regular Room service appropriate? Yes with Assist; Fluid consistency: Thin  Diet effective now                         Precautions / Restrictions Precautions Precautions: Fall Restrictions Weight Bearing Restrictions Per Provider Order: No    Has the patient had 2 or more falls or a fall with injury in the past year? No   Prior Activity Level Household: Goes out for MD  appointments   Prior Functional Level Self Care: Did the patient need help bathing, dressing, using the toilet or eating? Independent   Indoor Mobility: Did the patient need assistance with walking from room to room (with or without device)? Independent   Stairs: Did the patient need assistance with internal or external stairs (with or without device)? Needed some help   Functional Cognition: Did the patient need help planning  regular tasks such as shopping or remembering to take medications? Independent   Patient Information Are you of Hispanic, Latino/a,or Spanish origin?: A. No, not of Hispanic, Latino/a, or Spanish origin What is your race?: A. White Do you need or want an interpreter to communicate with a doctor or health care staff?: 0. No   Patient's Response To:  Health Literacy and Transportation Is the patient able to respond to health literacy and transportation needs?: Yes Health Literacy - How often do you need to have someone help you when you read instructions, pamphlets, or other written material from your doctor or pharmacy?: Rarely In the past 12 months, has lack of transportation kept you from medical appointments or from getting medications?: No In the past 12 months, has lack of transportation kept you from meetings, work, or from getting things needed for daily living?: No   Home Assistive Devices / Equipment Home Equipment: Agricultural consultant (2 wheels), Shower seat, BSC/3in1, Toilet riser, Grab bars - toilet, Lift chair   Prior Device Use: Indicate devices/aids used by the patient prior to current illness, exacerbation or injury? Manual wheelchair, Environmental consultant, and Lift chair   Current Functional Level Cognition   Arousal/Alertness: Awake/alert Overall Cognitive Status: History of cognitive impairments - at baseline Orientation Level: Oriented X4 Memory: Appears intact Awareness: Appears intact    Extremity Assessment (includes Sensation/Coordination)   Upper  Extremity Assessment: Right hand dominant, RUE deficits/detail, LUE deficits/detail RUE Deficits / Details: baseline tremor, prior decreased coordination of R hand after stroke 3 years ago the patietn reports. pt holds digits in claw position but can fully extend with cues and time. No prior splint wear LUE Deficits / Details: baseline tremor noted  Lower Extremity Assessment: Defer to PT evaluation RLE Deficits / Details: RLE grossly 4-/5 LLE Deficits / Details: LLE grossly 3+/5     ADLs   Overall ADL's : Needs assistance/impaired Eating/Feeding: NPO Grooming: Moderate assistance Upper Body Bathing: Moderate assistance Lower Body Bathing: Maximal assistance Upper Body Dressing : Moderate assistance Lower Body Dressing: Maximal assistance Toilet Transfer: Moderate assistance, +2 for physical assistance, +2 for safety/equipment, Stand-pivot     Mobility   Overal bed mobility: Needs Assistance Bed Mobility: Rolling, Supine to Sit Rolling: Min assist Supine to sit: Min assist General bed mobility comments: pt needs cues to sequence task and help getting onto L elbow and then able to initiate pushing into sitting.     Transfers   Overall transfer level: Needs assistance Equipment used: 2 person hand held assist Transfers: Sit to/from Stand, Bed to chair/wheelchair/BSC Sit to Stand: +2 physical assistance, Mod assist Bed to/from chair/wheelchair/BSC transfer type::  (steps to chair with weight shift by therapist to advance) Step pivot transfers: Mod assist, +2 physical assistance General transfer comment: pt relies on R UE to hold and strong L lateral lean     Ambulation / Gait / Stairs / Wheelchair Mobility         Posture / Balance Balance Overall balance assessment: Needs assistance Sitting-balance support: Single extremity supported, Feet supported Sitting balance-Leahy Scale: Poor Postural control: Left lateral lean Standing balance support: Single extremity supported, During  functional activity Standing balance-Leahy Scale: Poor Standing balance comment: modA     Special needs/care consideration Skin Reddened coccyx with dressing and Bladder Incontinence    Previous Home Environment (from acute therapy documentation) Living Arrangements: Alone  Lives With: Family Available Help at Discharge: Family Type of Home: House Home Layout: One level, Laundry or work area in basement  Home Access: Level entry (front entrance with 1STE) Bathroom Shower/Tub: Walk-in shower Bathroom Toilet: Standard Additional Comments: has x2 dogs great great daughters on property that daugther cares for. pt has no animal care needs. pt gets all meals provided by daughters that live on the same price of land. one daughter had pins placed in knee in less than a week ago so will have limited physcial (A) for the patient   Discharge Living Setting Plans for Discharge Living Setting: Patient's home, Alone (Lives alone with assist from daughters.) Type of Home at Discharge: House Discharge Home Layout: Two level, Laundry or work area in basement, Able to live on main level with bedroom/bathroom, Full bath on main level (Does not go down to basement any more.) Alternate Level Stairs-Number of Steps: Flight Discharge Home Access: Level entry Discharge Bathroom Shower/Tub: Walk-in shower, Door Discharge Bathroom Toilet: Handicapped height Discharge Bathroom Accessibility: Yes How Accessible: Accessible via walker   Social/Family/Support Systems Patient Roles: Parent (Has five living children.) Contact Information: Windell Hummingbird - daughter - [phone] Anticipated Caregiver: Drue Second - daughter Anticipated Caregiver's Contact Information: [name] - [phone] Ability/Limitations of Caregiver: Rinaldo Cloud fell and had pins placed in her knee, so not much help.  [name] can stay with patient.  [name] works Systems developer but can get family to stay if she goes to work. Caregiver Availability:  24/7 Discharge Plan Discussed with Primary Caregiver: Yes Is Caregiver In Agreement with Plan?: Yes Does Caregiver/Family have Issues with Lodging/Transportation while Pt is in Rehab?: No   Goals Patient/Family Goal for Rehab: PT/OT/SLP supervision goals Expected length of stay: 7 - 10 days Pt/Family Agrees to Admission and willing to participate: Yes Program Orientation Provided & Reviewed with Pt/Caregiver Including Roles  & Responsibilities: Yes   Decrease burden of Care through IP rehab admission: N/A   Possible need for SNF placement upon discharge: Not anticipated   Patient Condition: I have reviewed medical records from [facility], spoken with CM, and patient and daughter. I met with patient at the bedside for inpatient rehabilitation assessment.  Patient will benefit from ongoing PT, OT, and SLP, can actively participate in 3 hours of therapy a day 5 days of the week, and can make measurable gains during the admission.  Patient will also benefit from the coordinated team approach during an Inpatient Acute Rehabilitation admission.  The patient will receive intensive therapy as well as Rehabilitation physician, nursing, social worker, and care management interventions.  Due to bladder management, bowel management, safety, skin/wound care, disease management, medication administration, pain management, and patient education the patient requires 24 hour a day rehabilitation nursing.  The patient is currently Min-Mod A with mobility and Mod-Max A with basic ADLs.  Discharge setting and therapy post discharge at home with home health is anticipated.  Patient has agreed to participate in the Acute Inpatient Rehabilitation Program and will admit today.   Preadmission Screen Completed By:  Trish Mage, 04/20/2023 2:43 PM ______________________________________________________________________   Discussed status with Dr. Shearon Stalls on 04/21/23 at 11:58 AM and received approval for admission  today.   Admission Coordinator:  Trish Mage, RN, time 11:58 AM/Date 04/21/23     Assessment/Plan: Diagnosis: R MCA stroke  Does the need for close, 24 hr/day Medical supervision in concert with the patient's rehab needs make it unreasonable for this patient to be served in a less intensive setting? Yes Co-Morbidities requiring supervision/potential complications: A fib, hypertension, AHRF, HFpEF, AKI, and wound montorroing s/p thrombectomy Due to safety, skin/wound care,  disease management, medication administration, and patient education, does the patient require 24 hr/day rehab nursing? Yes Does the patient require coordinated care of a physician, rehab nurse, PT, OT, and SLP to address physical and functional deficits in the context of the above medical diagnosis(es)? Yes Addressing deficits in the following areas: balance, endurance, locomotion, strength, transferring, bowel/bladder control, bathing, dressing, feeding, grooming, toileting, cognition, and speech Can the patient actively participate in an intensive therapy program of at least 3 hrs of therapy 5 days a week? Yes The potential for patient to make measurable gains while on inpatient rehab is good Anticipated functional outcomes upon discharge from inpatient rehab: supervision PT, supervision OT, supervision SLP Estimated rehab length of stay to reach the above functional goals is: 7-10 days Anticipated discharge destination: Home 10. Overall Rehab/Functional Prognosis: good     MD Signature:   Angelina Sheriff, DO 04/21/2023

## 2023-04-21 NOTE — Plan of Care (Signed)
  Problem: Education: Goal: Knowledge of disease or condition will improve Outcome: Progressing Goal: Knowledge of secondary prevention will improve (MUST DOCUMENT ALL) Outcome: Progressing Goal: Knowledge of patient specific risk factors will improve (DELETE if not current risk factor) Outcome: Progressing   Problem: Ischemic Stroke/TIA Tissue Perfusion: Goal: Complications of ischemic stroke/TIA will be minimized Outcome: Progressing   Problem: Coping: Goal: Will verbalize positive feelings about self Outcome: Progressing Goal: Will identify appropriate support needs Outcome: Progressing   Problem: Health Behavior/Discharge Planning: Goal: Ability to manage health-related needs will improve Outcome: Progressing Goal: Goals will be collaboratively established with patient/family Outcome: Progressing   Problem: Self-Care: Goal: Ability to participate in self-care as condition permits will improve Outcome: Progressing Goal: Verbalization of feelings and concerns over difficulty with self-care will improve Outcome: Progressing Goal: Ability to communicate needs accurately will improve Outcome: Progressing   Problem: Nutrition: Goal: Risk of aspiration will decrease Outcome: Progressing Goal: Dietary intake will improve Outcome: Progressing   Problem: Education: Goal: Knowledge of General Education information will improve Description: Including pain rating scale, medication(s)/side effects and non-pharmacologic comfort measures Outcome: Progressing   Problem: Health Behavior/Discharge Planning: Goal: Ability to manage health-related needs will improve Outcome: Progressing   Problem: Clinical Measurements: Goal: Ability to maintain clinical measurements within normal limits will improve Outcome: Progressing Goal: Will remain free from infection Outcome: Progressing Goal: Diagnostic test results will improve Outcome: Progressing Goal: Respiratory complications will  improve Outcome: Progressing Goal: Cardiovascular complication will be avoided Outcome: Progressing   Problem: Activity: Goal: Risk for activity intolerance will decrease Outcome: Progressing   Problem: Nutrition: Goal: Adequate nutrition will be maintained Outcome: Progressing   Problem: Coping: Goal: Level of anxiety will decrease Outcome: Progressing   Problem: Elimination: Goal: Will not experience complications related to bowel motility Outcome: Progressing Goal: Will not experience complications related to urinary retention Outcome: Progressing   Problem: Pain Managment: Goal: General experience of comfort will improve and/or be controlled Outcome: Progressing   Problem: Safety: Goal: Ability to remain free from injury will improve Outcome: Progressing   Problem: Skin Integrity: Goal: Risk for impaired skin integrity will decrease Outcome: Progressing   Problem: Education: Goal: Understanding of CV disease, CV risk reduction, and recovery process will improve Outcome: Progressing Goal: Individualized Educational Video(s) Outcome: Progressing   Problem: Activity: Goal: Ability to return to baseline activity level will improve Outcome: Progressing   Problem: Cardiovascular: Goal: Ability to achieve and maintain adequate cardiovascular perfusion will improve Outcome: Progressing Goal: Vascular access site(s) Level 0-1 will be maintained Outcome: Progressing   Problem: Health Behavior/Discharge Planning: Goal: Ability to safely manage health-related needs after discharge will improve Outcome: Progressing   Problem: Activity: Goal: Ability to tolerate increased activity will improve Outcome: Progressing   Problem: Respiratory: Goal: Ability to maintain a clear airway and adequate ventilation will improve Outcome: Progressing   Problem: Role Relationship: Goal: Method of communication will improve Outcome: Progressing

## 2023-04-21 NOTE — Progress Notes (Signed)
PHARMACIST - PHYSICIAN COMMUNICATION  DR:   Erlinda Hong  CONCERNING: IV to Oral Route Change Policy  RECOMMENDATION: This patient is receiving protonix by the intravenous route.  Based on criteria approved by the Pharmacy and Therapeutics Committee, the intravenous medication(s) is/are being converted to the equivalent oral dose form(s).   DESCRIPTION: These criteria include: The patient is eating (either orally or via tube) and/or has been taking other orally administered medications for a least 24 hours The patient has no evidence of active gastrointestinal bleeding or impaired GI absorption (gastrectomy, short bowel, patient on TNA or NPO).  If you have questions about this conversion, please contact the Pharmacy Department  []   [phone] )  [facility] []   [phone] )  [facility] [x]   [phone] )  [facility] []   [phone] )  [facility] []   [phone] )  [facility]

## 2023-04-21 NOTE — H&P (Signed)
 Physical Medicine and Rehabilitation Admission H&P        Chief Complaint  Patient presents with   Code Stroke  : HPI: Frances Kent is a 89 year old right-handed female fibrillation/SVT on Xarelto/pacemaker, recent COVID infection, CKD stage III diastolic congestive heart failure, hypertension, hypothyroidism, macular degeneration, mitral regurgitation, PE 2019 as well as prior CVA 9/22 without significant residual deficits.  Per chart review patient lives alone.  1 level home with one-step to entry.  Ambulatory with a rolling walker.  Good support of local family.  Presented 04/19/2023 with acute onset of aphasia as well as left-sided weakness.  CT scan negative.  CT a showed occlusion versus critical stenosis of the right M1 MCA with asymmetrical diminished opacification of more distal right MCA vessels.  Suspected thrombus visualized within the M1 MCA and extends into proximal M2 MCA branches.  MRI showed acute infarct right insula, right frontal lobe and the cerebellum bilateral.  MRA showed no large vessel occlusion.  Patient did not receive tPA.  Admission chemistries unremarkable except creatinine 1.33.  Echocardiogram with ejection fraction of 30 to 35%.  Left ventricle moderately decreased function.  Patient did undergo revascularization/thrombectomy per interventional radiology.  She was slowly extubated.  Initially on aspirin suppository switched  to Eliquis.  She is tolerating a regular diet.  Therapy evaluations completed due to patient decreased functional ability left side weakness was admitted for a comprehensive rehab program.   Review of Systems  Constitutional:  Negative for chills and fever.  HENT:  Negative for hearing loss.   Eyes:  Negative for blurred vision and double vision.  Respiratory:  Negative for shortness of breath and wheezing.   Cardiovascular:  Positive for palpitations. Negative for chest pain and leg swelling.  Gastrointestinal:  Positive for  constipation. Negative for heartburn, nausea and vomiting.       GERD  Genitourinary:  Negative for dysuria, flank pain and hematuria.  Musculoskeletal:  Positive for myalgias.  Skin:  Negative for rash.  Neurological:  Positive for speech change and weakness.  All other systems reviewed and are negative.       Past Medical History:  Diagnosis Date   Arthritis     Atrial fibrillation (HCC)     Benign breast cyst in female, left 10/07/2016   Candida esophagitis (HCC) 05/03/2021    Found on Mar 10 2021 EGD    CAP (community acquired pneumonia) 02/15/2021   CHF (congestive heart failure) (HCC)     Colon adenomas     GERD (gastroesophageal reflux disease)     HCAP (healthcare-associated pneumonia) 02/02/2021   Hypertension     Hypothyroidism     Macular degeneration     Mitral regurgitation     Pulmonary embolism (HCC) 02/2016   Severe sepsis (HCC) 02/02/2021   Stroke (HCC) 10/2020   SVT (supraventricular tachycardia) (HCC)               Past Surgical History:  Procedure Laterality Date   APPENDECTOMY   1960   BREAST CYST ASPIRATION Left 2017   CARDIOVERSION N/A 04/05/2016    Procedure: Cardioversion;  Surgeon: Lamar Blinks, MD;  Location: [facility] OR[facility];  Service: Cardiovascular;  Laterality: N/A;   CHOLECYSTECTOMY   1985   ESOPHAGOGASTRODUODENOSCOPY N/A 03/10/2021    Procedure: ESOPHAGOGASTRODUODENOSCOPY (EGD);  Surgeon: Toledo, Boykin Nearing, MD;  Location: [facility] ENDOS[facility];  Service: Gastroenterology;  Laterality: N/A;   ESOPHAGOGASTRODUODENOSCOPY (EGD) WITH PROPOFOL N/A 11/27/2019    Procedure: ESOPHAGOGASTRODUODENOSCOPY (EGD)  WITH PROPOFOL;  Surgeon: Regis Bill, MD;  Location: [facility] ENDOSCOPY;  Service: Endoscopy;  Laterality: N/A;   ESOPHAGOGASTRODUODENOSCOPY (EGD) WITH PROPOFOL N/A 12/24/2019    Procedure: ESOPHAGOGASTRODUODENOSCOPY (EGD) WITH PROPOFOL;  Surgeon: Regis Bill, MD;  Location: [facility] ENDOSCOPY;  Service: Endoscopy;  Laterality: N/A;   IR CT HEAD  LTD   11/01/2020   IR CT HEAD LTD   04/19/2023   IR PERCUTANEOUS ART THROMBECTOMY/INFUSION INTRACRANIAL INC DIAG ANGIO   11/01/2020   IR PERCUTANEOUS ART THROMBECTOMY/INFUSION INTRACRANIAL INC DIAG ANGIO   04/19/2023   IR US GUIDE VASC ACCESS RIGHT   11/02/2020   OVARIAN CYST REMOVAL       PACEMAKER LEADLESS INSERTION N/A 11/16/2021    Procedure: PACEMAKER LEADLESS INSERTION;  Surgeon: Marcina Millard, MD;  Location: [facility] INVASIVE CV LAB;  Service: Cardiovascular;  Laterality: N/A;   RADIOLOGY WITH ANESTHESIA N/A 11/01/2020    Procedure: RADIOLOGY WITH ANESTHESIA;  Surgeon: Julieanne Cotton, MD;  Location: [facility] OR;  Service: Radiology;  Laterality: N/A;   RADIOLOGY WITH ANESTHESIA N/A 04/19/2023    Procedure: RADIOLOGY WITH ANESTHESIA;  Surgeon: Radiologist, Medication, MD;  Location: [facility] OR;  Service: Radiology;  Laterality: N/A;   TEE WITHOUT CARDIOVERSION N/A 02/01/2018    Procedure: TRANSESOPHAGEAL ECHOCARDIOGRAM (TEE);  Surgeon: Lamar Blinks, MD;  Location: [facility] ORS;  Service: Cardiovascular;  Laterality: N/A;             Family History  Problem Relation Age of Onset   Cancer Mother 39        breast cancer, lived to 59,    Breast cancer Mother 41   Cancer Son 49        pancreatic cancer   Heart disease Son          CAD, Tobacco Abuse    Heart disease Maternal Grandmother 16        died of massive MI   Stroke Sister          Social History:  reports that she has never smoked. She has never used smokeless tobacco. She reports that she does not drink alcohol and does not use drugs. Allergies:  Allergies       Allergies  Allergen Reactions   Statins Hives      Severe myalgias            Medications Prior to Admission  Medication Sig Dispense Refill   acetaminophen (TYLENOL) 500 MG tablet Take 500 mg by mouth every 6 (six) hours as needed.       furosemide (LASIX) 20 MG tablet Take 1 tablet (20 mg total) by mouth daily. 30 tablet 3   levalbuterol (XOPENEX) 0.63  MG/3ML nebulizer solution USE 1  VIAL IN NEBULIZER EVERY 4 HOURS AS NEEDED FOR WHEEZING AND FOR SHORTNESS OF BREATH 150 mL 5   levothyroxine (SYNTHROID) 50 MCG tablet Take 1 tablet (50 mcg total) by mouth daily before breakfast. 90 tablet 0   metoprolol succinate (TOPROL-XL) 25 MG 24 hr tablet Take 1 tablet (25 mg total) by mouth 2 (two) times daily. Take with or immediately following a meal.       pantoprazole (PROTONIX) 20 MG tablet Take 1 tablet (20 mg total) by mouth daily. (Patient taking differently: Take 20 mg by mouth 2 (two) times daily.) 90 tablet 1   Probiotic CHEW Chew 2 tablets by mouth daily.       XARELTO 15 MG TABS tablet TAKE 1 TABLET EVERY DAY 90 tablet 3  ALPRAZolam (XANAX) 0.25 MG tablet Take 0.25 mg by mouth daily as needed for anxiety. (Patient not taking: Reported on 04/19/2023)       lisinopril (ZESTRIL) 20 MG tablet Take 20 mg by mouth daily. (Patient not taking: Reported on 04/19/2023)       potassium chloride SA (KLOR-CON M) 20 MEQ tablet Take 1 tablet (20 mEq total) by mouth daily. (Patient not taking: Reported on 04/19/2023) 5 tablet 0   sertraline (ZOLOFT) 25 MG tablet Take 25 mg by mouth daily. (Patient not taking: Reported on 04/19/2023)       torsemide (DEMADEX) 20 MG tablet Take 20 mg by mouth daily. (Patient not taking: Reported on 04/19/2023)                  Home: Home Living Family/patient expects to be discharged to:: Private residence Living Arrangements: Alone Available Help at Discharge: Family Type of Home: House Home Access: Level entry (front entrance with 1STE) Home Layout: One level, Laundry or work area in basement [employer] Shower/Tub: Health visitor: Standard Home Equipment: Agricultural consultant (2 wheels), Information systems manager, BSC/3in1, Toilet riser, Grab bars - toilet, Lift chair Additional Comments: has x2 dogs great great daughters on property that daugther cares for. pt has no animal care needs. pt gets all meals provided by daughters that  live on the same price of land. one daughter had pins placed in knee in less than a week ago so will have limited physcial (A) for the patient  Lives With: Family   Functional History: Prior Function Prior Level of Function : Needs assist Mobility Comments: ambulatory with RW ADLs Comments: sponge bathing, assist for IADLs   Functional Status:  Mobility: Bed Mobility Overal bed mobility: Needs Assistance Bed Mobility: Supine to Sit, Sit to Supine Rolling: Min assist Supine to sit: Min assist, HOB elevated, Used rails Sit to supine: Min assist, Used rails General bed mobility comments: Good initiation once cued, pt needs cues to sequence task and using RUE to pull trunk forward/toward her L side toward EOB then help getting onto L elbow and then able to initiate pushing into sitting; BLE assist to return to supine. Transfers Overall transfer level: Needs assistance Equipment used: Rolling walker (2 wheels) Transfers: Sit to/from Stand Sit to Stand: Min assist Bed to/from chair/wheelchair/BSC transfer type::  (steps to chair with weight shift by therapist to advance) Step pivot transfers: Mod assist, +2 physical assistance General transfer comment: cues for sequencing/UE placement, minA lift and steadying assist and pt able to use BUE on RW handles to steady herself. Ambulation/Gait Ambulation/Gait assistance: Min assist, Mod assist Gait Distance (Feet): 7 Feet Assistive device: Rolling walker (2 wheels) Gait Pattern/deviations: Step-to pattern, Decreased stride length, Decreased dorsiflexion - right, Decreased dorsiflexion - left, Narrow base of support General Gait Details: Dense cues for step length/height, very low foot clearance despite cues and poor bil dorsiflexion, pt also needs assist for RW management but RW in her room is heavy/bariatric width so would benefit from youth height RW next session to see if it helps her to manage AD better. Up to [facility] for backward stepping last  few feet due to pt c/o fatigue and difficulty managing heavier RW. Gait velocity: limited, grossly <0.2 m/s Pre-gait activities: standing hip flexion x10 reps, does fairly well with marching prior to gait but during gait very limited hip flexion despite cues   ADL: ADL Overall ADL's : Needs assistance/impaired Eating/Feeding: NPO Grooming: Moderate assistance Upper Body Bathing: Moderate assistance  Lower Body Bathing: Maximal assistance Upper Body Dressing : Moderate assistance Lower Body Dressing: Maximal assistance Toilet Transfer: Moderate assistance, +2 for physical assistance, +2 for safety/equipment, Stand-pivot   Cognition: Cognition Overall Cognitive Status: History of cognitive impairments - at baseline Arousal/Alertness: Awake/alert Orientation Level: Oriented to person, Oriented to place, Oriented to time, Oriented to situation Memory: Appears intact Awareness: Appears intact Cognition Arousal: Alert Behavior During Therapy: WFL for tasks assessed/performed Overall Cognitive Status: History of cognitive impairments - at baseline   Physical Exam: Blood pressure 129/76, pulse 65, temperature 98.3 F (36.8 C), temperature source Oral, resp. rate 17, height 5\' 3"  (1.6 m), weight 47.6 kg, SpO2 93%.  Physical Exam Constitutional: No apparent distress. Appropriate appearance for age.  Laying in bed. HENT: No JVD. Neck Supple. Trachea midline. Atraumatic, normocephalic. Eyes: PERRLA. EOMI. Visual fields grossly intact.  Cardiovascular: RRR, no murmurs/rub/gallops.  1+ bilateral edema. Peripheral pulses 2+  Respiratory: CTAB. No rales, rhonchi, or wheezing. On RA.  Abdomen: + bowel sounds, normoactive. No distention or tenderness.  Skin: C/D/I. No apparent lesions.  IV intact. -Slight MASD on coccyx  MSK:      Right upper extremity digits 3-4 will crack and sublux slightly at the MCPs when extended to neutral.  Otherwise, full active range of motion       Neurologic  exam:  Cognition: AAO to person, place, time and event.  Language: Fluent, No substitutions or neoglisms. No dysarthria. Names 3/3 objects correctly.  Memory: Recalls 2/3 objects at 5 minutes. No apparent deficits  Insight: Good insight into current condition.  Mood: Pleasant affect, appropriate mood.  Sensation: To light touch intact in BL UEs and LEs  Reflexes: 2+ in BL UE and LEs. Negative Hoffman's and babinski signs bilaterally.  CN: 2-12 grossly intact.  Coordination: No ataxia on FTN, HTS bilaterally.  During exam, will intermittently go into clonus like shaking of bilateral lower extremities--Per family, she does this when anxious, is baseline.  Spasticity: MAS 1 in right finger flexors, plantar flexors      Strength:                RUE: 5/5 SA, 5/5 EF, 5/5 EE, 4/5 WE, 4/5 FF, 4/5 FA                LUE:  5/5 SA, 5/5 EF, 5/5 EE, 5/5 WE, 5/5 FF, 5/5 FA                RLE: 5/5 HF, 5/5 KE, 4/5  DF, 4/5  EHL, 4/5  PF                 LLE:  5/5 HF, 5/5 KE, 5/5  DF, 5/5  EHL, 5/5  PF         Lab Results Last 48 Hours        Results for orders placed or performed during the hospital encounter of 04/19/23 (from the past 48 hours)  Triglycerides     Status: None    Collection Time: 04/20/23  6:04 AM  Result Value Ref Range    Triglycerides 87 <150 mg/dL      Comment: Performed at [facility], [address], [place], [place] [address]  Magnesium     Status: None    Collection Time: 04/20/23  6:04 AM  Result Value Ref Range    Magnesium 2.2 1.7 - 2.4 mg/dL      Comment: Performed at [facility], [address], [place], [place] [address]  Basic metabolic panel     Status: Abnormal    Collection Time: 04/20/23  6:04 AM  Result Value Ref Range    Sodium 139 135 - 145 mmol/L    Potassium 5.7 (H) 3.5 - 5.1 mmol/L    Chloride 111 98 - 111 mmol/L    CO2 24 22 - 32 mmol/L    Glucose, Bld 99 70 - 99 mg/dL      Comment: Glucose reference range applies only to samples taken  after fasting for at least 8 hours.    BUN 16 8 - 23 mg/dL    Creatinine, Ser 6.04 (H) 0.44 - 1.00 mg/dL    Calcium 8.7 (L) 8.9 - 10.3 mg/dL    GFR, Estimated 36 (L) >60 mL/min      Comment: (NOTE) Calculated using the CKD-EPI Creatinine Equation (2021)      Anion gap 4 (L) 5 - 15      Comment: Performed at [facility], [address], [place], [place] [address]  CBC     Status: Abnormal    Collection Time: 04/20/23  6:04 AM  Result Value Ref Range    WBC 7.8 4.0 - 10.5 K/uL    RBC 4.21 3.87 - 5.11 MIL/uL    Hemoglobin 12.0 12.0 - 15.0 g/dL    HCT 11.9 14.7 - 82.9 %    MCV 91.2 80.0 - 100.0 fL    MCH 28.5 26.0 - 34.0 pg    MCHC 31.3 30.0 - 36.0 g/dL    RDW 56.2 (H) 13.0 - 15.5 %    Platelets 216 150 - 400 K/uL    nRBC 0.0 0.0 - 0.2 %      Comment: Performed at [facility], [address] [place], [place] [address]  Basic metabolic panel     Status: Abnormal    Collection Time: 04/21/23  7:12 AM  Result Value Ref Range    Sodium 140 135 - 145 mmol/L    Potassium 4.5 3.5 - 5.1 mmol/L    Chloride 109 98 - 111 mmol/L    CO2 24 22 - 32 mmol/L    Glucose, Bld 86 70 - 99 mg/dL      Comment: Glucose reference range applies only to samples taken after fasting for at least 8 hours.    BUN 21 8 - 23 mg/dL    Creatinine, Ser 4.69 (H) 0.44 - 1.00 mg/dL    Calcium 8.6 (L) 8.9 - 10.3 mg/dL    GFR, Estimated 36 (L) >60 mL/min      Comment: (NOTE) Calculated using the CKD-EPI Creatinine Equation (2021)      Anion gap 7 5 - 15      Comment: Performed at [facility], [address] [place], [place] [address]  CBC     Status: Abnormal    Collection Time: 04/21/23  7:12 AM  Result Value Ref Range    WBC 6.9 4.0 - 10.5 K/uL    RBC 3.85 (L) 3.87 - 5.11 MIL/uL    Hemoglobin 11.0 (L) 12.0 - 15.0 g/dL    HCT 84.1 (L) 32.4 - 46.0 %    MCV 90.4 80.0 - 100.0 fL    MCH 28.6 26.0 - 34.0 pg    MCHC 31.6 30.0 - 36.0 g/dL    RDW 40.1 (H) 02.7 - 15.5 %    Platelets 167 150 -  400 K/uL    nRBC 0.0  0.0 - 0.2 %      Comment: Performed at [facility], [address] [place], [place] [address]       Imaging Results (Last 48 hours)  ECHOCARDIOGRAM COMPLETE Result Date: 04/20/2023    ECHOCARDIOGRAM REPORT   Patient Name:   Frances Kent Date of Exam: 04/20/2023 Medical Rec #:  [id number]        Height:       63.0 in Accession #:    [id number]       Weight:       104.9 lb Date of Birth:  [birth year]        BSA:          1.470 m Patient Age:    89 years         BP:           108/49 mmHg Patient Gender: F                HR:           50 bpm. Exam Location:  Inpatient Procedure: 2D Echo, Cardiac Doppler, Color Doppler and Intracardiac            Opacification Agent (Both Spectral and Color Flow Doppler were            utilized during procedure). Indications:    Stroke I63.9  History:        Patient has prior history of Echocardiogram examinations, most                 recent 11/28/2022. HFrEF, Stroke, Carotid Disease and CKD 3b,                 Arrythmias:Atrial Fibrillation; Risk Factors:Non-Smoker,                 Hypertension and Dyslipidemia.  Sonographer:    Dondra Prader RVT RCS Referring Phys: [id number] [facility] XU  Sonographer Comments: Attempted Definity IMPRESSIONS  1. Diffuse hypokinesis, apical akinesis.. Left ventricular ejection fraction, by estimation, is 30 to 35%. The left ventricle has moderately decreased function.  2. Right ventricular systolic function is mildly reduced. The right ventricular size is normal.  3. Left atrial size was severely dilated.  4. Right atrial size was moderately dilated.  5. MV is thickened, difficult to see leaflets well. Peak and mean gradients through the MV are 6 and 2 mm Hg. . Mild to moderate mitral valve regurgitation. Mild mitral stenosis. Moderate mitral annular calcification.  6. The aortic valve is tricuspid. Aortic valve regurgitation is mild to moderate. Aortic valve sclerosis is present, with no evidence of aortic valve stenosis.   7. The inferior vena cava is normal in size with <50% respiratory variability, suggesting right atrial pressure of 8 mmHg. Comparison(s): The left ventricular function is worsened. EF 40-45 %. FINDINGS  Left Ventricle: Diffuse hypokinesis, apical akinesis. Left ventricular ejection fraction, by estimation, is 30 to 35%. The left ventricle has moderately decreased function. Definity contrast agent was given IV to delineate the left ventricular endocardial borders. The left ventricular internal cavity size was normal in size. There is no left ventricular hypertrophy. Right Ventricle: The right ventricular size is normal. Right vetricular wall thickness was not assessed. Right ventricular systolic function is mildly reduced. Left Atrium: Left atrial size was severely dilated. Right Atrium: Right atrial size was moderately dilated. Pericardium: Trivial pericardial effusion is present. Mitral Valve: MV is thickened, difficult to see leaflets well. Peak and mean gradients through the  MV are 6 and 2 mm Hg. Moderate mitral annular calcification. Mild to moderate mitral valve regurgitation. Mild mitral valve stenosis. MV peak gradient, 6.5  mmHg. The mean mitral valve gradient is 2.0 mmHg. Tricuspid Valve: The tricuspid valve is normal in structure. Tricuspid valve regurgitation is mild. Aortic Valve: The aortic valve is tricuspid. Aortic valve regurgitation is mild to moderate. Aortic regurgitation PHT measures 567 msec. Aortic valve sclerosis is present, with no evidence of aortic valve stenosis. Aortic valve mean gradient measures 1.0  mmHg. Aortic valve peak gradient measures 2.7 mmHg. Aortic valve area, by VTI measures 1.95 cm. Pulmonic Valve: The pulmonic valve was normal in structure. Pulmonic valve regurgitation is not visualized. Aorta: The aortic root and ascending aorta are structurally normal, with no evidence of dilitation. Venous: The inferior vena cava is normal in size with less than 50% respiratory  variability, suggesting right atrial pressure of 8 mmHg.  LEFT VENTRICLE PLAX 2D LVIDd:         5.00 cm LVIDs:         4.20 cm LV PW:         1.10 cm LV IVS:        0.90 cm LVOT diam:     1.90 cm LV SV:         38 LV SV Index:   26 LVOT Area:     2.84 cm  RIGHT VENTRICLE            IVC RV Basal diam:  3.30 cm    IVC diam: 2.00 cm RV S prime:     6.86 cm/s TAPSE (M-mode): 1.2 cm LEFT ATRIUM             Index        RIGHT ATRIUM           Index LA diam:        5.10 cm 3.47 cm/m   RA Area:     22.10 cm LA Vol (A2C):   89.5 ml 60.85 ml/m  RA Volume:   72.30 ml  49.18 ml/m LA Vol (A4C):   60.6 ml 41.19 ml/m LA Biplane Vol: 81.7 ml 55.58 ml/m  AORTIC VALVE                    PULMONIC VALVE AV Area (Vmax):    2.19 cm     PV Vmax:       0.67 m/s AV Area (Vmean):   1.95 cm     PV Peak grad:  1.8 mmHg AV Area (VTI):     1.95 cm AV Vmax:           81.50 cm/s AV Vmean:          53.300 cm/s AV VTI:            0.196 m AV Peak Grad:      2.7 mmHg AV Mean Grad:      1.0 mmHg LVOT Vmax:         62.90 cm/s LVOT Vmean:        36.600 cm/s LVOT VTI:          0.135 m LVOT/AV VTI ratio: 0.69 AI PHT:            567 msec AR Vena Contracta: 0.50 cm  AORTA Ao Root diam: 3.00 cm Ao Asc diam:  2.50 cm MITRAL VALVE                TRICUSPID VALVE  MV Area (PHT): 2.42 cm     TR Peak grad:   53.9 mmHg MV Area VTI:   0.71 cm     TR Vmax:        367.00 cm/s MV Peak grad:  6.5 mmHg MV Mean grad:  2.0 mmHg     SHUNTS MV Vmax:       1.27 m/s     Systemic VTI:  0.14 m MV Vmean:      66.4 cm/s    Systemic Diam: 1.90 cm MV Decel Time: 313 msec MR PISA:        6.28 cm MR PISA Radius: 1.00 cm MV E velocity: 143.00 cm/s MV A velocity: 38.10 cm/s MV E/A ratio:  3.75 Dietrich Pates MD Electronically signed by Dietrich Pates MD Signature Date/Time: 04/20/2023/3:41:15 PM    Final     DG Swallowing Func-Speech Pathology Result Date: 04/20/2023 Table formatting from the original result was not included. Modified Barium Swallow Study Patient Details Name: Frances Kent MRN: [id number] Date of Birth: Jun 20, 1930 Today's Date: 04/20/2023 HPI/PMH: HPI: Frances Kent is a 89 yo female presenting to ED 3/5 with acute onset of aphasia and L sided weakness. MRI - Acute infarcts in the right insula, right frontal lobe and the cerebellum bilaterally. Status post IR revascularization 3/5.  MBS 02/2021 - generally functional swallow with some oral holding but no aspiration, no residue. Pt previously seen by SLP in [facility] for cognitive impairments (problem solving, divergent naming, memory retrieval) s/p L basal ganglia CVA 2022. PMH includes A-fib, pacemaker, arthritis, recent COVID infection, GERD, HFrEF, HTN, macular degeneration, hypothyroidism, mitral regurgitation, PE (2019), prior CVA without residual deficits, SVT Clinical Impression: Clinical Impression: Pt presents with very functional swallowing marked only by occasional oral holding and portioning of bolus that she allows to move into the pharynx a bit at a time.  Laryngeal vestibule closure was reliable with no penetration nor aspiration. Pharyngeal stripping and base of tongue retraction were strong, leading to effective clearance of material through the pharynx. Pt declined to swallow the barium pill, stating that she breaks her pills up at baseline.  Recommend resuming a regular diet, thin liquids. She was told several years ago that she should not use straws - today she drank from a straw normally without any concerns.  Crush meds.Will need assist with feeding due to tremors. No further SLP f/u for swallowing is needed. Conveyed results to RN> Factors that may increase risk of adverse event in presence of aspiration Rubye Oaks & Clearance Coots 2021): No data recorded Recommendations/Plan: Swallowing Evaluation Recommendations Swallowing Evaluation Recommendations Recommendations: PO diet PO Diet Recommendation: Regular; Thin liquids (Level 0) Liquid Administration via: Cup; Straw Medication Administration: Crushed with puree  Supervision: Patient able to self-feed; Staff to assist with self-feeding Oral care recommendations: Oral care BID (2x/day) Treatment Plan Treatment Plan Treatment recommendations: No treatment recommended at this time Follow-up recommendations: No SLP follow up Recommendations Recommendations for follow up therapy are one component of a multi-disciplinary discharge planning process, led by the attending physician.  Recommendations may be updated based on patient status, additional functional criteria and insurance authorization. Assessment: Orofacial Exam: Orofacial Exam Oral Cavity - Dentition: Missing dentition; Edentulous Orofacial Anatomy: WFL Anatomy: Anatomy: WFL Boluses Administered: Boluses Administered Boluses Administered: Thin liquids (Level 0); Puree; Solid  Oral Impairment Domain: Oral Impairment Domain Lip Closure: No labial escape Tongue control during bolus hold: Cohesive bolus between tongue to palatal seal Bolus preparation/mastication: Slow prolonged chewing/mashing with complete recollection  Bolus transport/lingual motion: Delayed initiation of tongue motion (oral holding) Oral residue: Complete oral clearance Initiation of pharyngeal swallow : Valleculae  Pharyngeal Impairment Domain: Pharyngeal Impairment Domain Soft palate elevation: No bolus between soft palate (SP)/pharyngeal wall (PW) Laryngeal elevation: Complete superior movement of thyroid cartilage with complete approximation of arytenoids to epiglottic petiole Anterior hyoid excursion: Complete anterior movement Epiglottic movement: Complete inversion Laryngeal vestibule closure: Complete, no air/contrast in laryngeal vestibule Pharyngeal stripping wave : Present - complete Pharyngoesophageal segment opening: Partial distention/partial duration, partial obstruction of flow Tongue base retraction: No contrast between tongue base and posterior pharyngeal wall (PPW) Pharyngeal residue: Complete pharyngeal clearance  Esophageal Impairment  Domain: No data recorded Pill: Pill Consistency administered: -- (pt declined, breaks pills into pieces at baseline) Penetration/Aspiration Scale Score: Penetration/Aspiration Scale Score 1.  Material does not enter airway: Thin liquids (Level 0); Puree; Solid Compensatory Strategies: Compensatory Strategies Compensatory strategies: Yes Straw: Effective   General Information: Caregiver present: No  Diet Prior to this Study: NPO   Temperature : Normal   Respiratory Status: WFL   Supplemental O2: None (Room air)   History of Recent Intubation: No  Behavior/Cognition: Alert; Cooperative; Pleasant mood Self-Feeding Abilities: Able to self-feed (tremor) Baseline vocal quality/speech: Normal Volitional Cough: Able to elicit Volitional Swallow: Able to elicit Exam Limitations: No limitations Goal Planning: Prognosis for improved oropharyngeal function: Good No data recorded No data recorded Patient/Family Stated Goal: nothing specific No data recorded Pain: Pain Assessment Pain Assessment: No/denies pain Facial Expression: 0 Body Movements: 0 Muscle Tension: 0 Compliance with ventilator (intubated pts.): 0 Vocalization (extubated pts.): N/A CPOT Total: 0 End of Session: Start Time:SLP Start Time (ACUTE ONLY): 1440 Stop Time: SLP Stop Time (ACUTE ONLY): 1456 Time Calculation:SLP Time Calculation (min) (ACUTE ONLY): 16 min Charges: SLP Evaluations $ SLP Speech Visit: 1 Visit SLP Evaluations $BSS Swallow: 1 Procedure $MBS Swallow: 1 Procedure $ SLP EVAL LANGUAGE/SOUND PRODUCTION: 1 Procedure SLP visit diagnosis: SLP Visit Diagnosis: Dysphagia, unspecified (R13.10) Past Medical History: Past Medical History: Diagnosis Date  Arthritis   Atrial fibrillation (HCC)   Benign breast cyst in female, left 10/07/2016  Candida esophagitis (HCC) 05/03/2021  Found on Mar 10 2021 EGD   CAP (community acquired pneumonia) 02/15/2021  CHF (congestive heart failure) (HCC)   Colon adenomas   GERD (gastroesophageal reflux disease)   HCAP  (healthcare-associated pneumonia) 02/02/2021  Hypertension   Hypothyroidism   Macular degeneration   Mitral regurgitation   Pulmonary embolism (HCC) 02/2016  Severe sepsis (HCC) 02/02/2021  Stroke (HCC) 10/2020  SVT (supraventricular tachycardia) (HCC)  Past Surgical History: Past Surgical History: Procedure Laterality Date  APPENDECTOMY  1960  BREAST CYST ASPIRATION Left 2017  CARDIOVERSION N/A 04/05/2016  Procedure: Cardioversion;  Surgeon: Lamar Blinks, MD;  Location: [facility];  Service: Cardiovascular;  Laterality: N/A;  CHOLECYSTECTOMY  1985  ESOPHAGOGASTRODUODENOSCOPY N/A 03/10/2021  Procedure: ESOPHAGOGASTRODUODENOSCOPY (EGD);  Surgeon: Toledo, Boykin Nearing, MD;  Location: [facility];  Service: Gastroenterology;  Laterality: N/A;  ESOPHAGOGASTRODUODENOSCOPY (EGD) WITH PROPOFOL N/A 11/27/2019  Procedure: ESOPHAGOGASTRODUODENOSCOPY (EGD) WITH PROPOFOL;  Surgeon: Regis Bill, MD;  Location: [facility];  Service: Endoscopy;  Laterality: N/A;  ESOPHAGOGASTRODUODENOSCOPY (EGD) WITH PROPOFOL N/A 12/24/2019  Procedure: ESOPHAGOGASTRODUODENOSCOPY (EGD) WITH PROPOFOL;  Surgeon: Regis Bill, MD;  Location: [facility];  Service: Endoscopy;  Laterality: N/A;  IR CT HEAD LTD  11/01/2020  IR CT HEAD LTD  04/19/2023  IR PERCUTANEOUS ART THROMBECTOMY/INFUSION INTRACRANIAL INC DIAG ANGIO  11/01/2020  IR PERCUTANEOUS ART THROMBECTOMY/INFUSION INTRACRANIAL INC DIAG  ANGIO  04/19/2023  IR US GUIDE VASC ACCESS RIGHT  11/02/2020  OVARIAN CYST REMOVAL    PACEMAKER LEADLESS INSERTION N/A 11/16/2021  Procedure: PACEMAKER LEADLESS INSERTION;  Surgeon: Marcina Millard, MD;  Location: [facility];  Service: Cardiovascular;  Laterality: N/A;  RADIOLOGY WITH ANESTHESIA N/A 11/01/2020  Procedure: RADIOLOGY WITH ANESTHESIA;  Surgeon: Julieanne Cotton, MD;  Location: [facility];  Service: Radiology;  Laterality: N/A;  RADIOLOGY WITH ANESTHESIA N/A 04/19/2023  Procedure: RADIOLOGY WITH ANESTHESIA;  Surgeon:  Radiologist, Medication, MD;  Location: [facility] OR;  Service: Radiology;  Laterality: N/A;  TEE WITHOUT CARDIOVERSION N/A 02/01/2018  Procedure: TRANSESOPHAGEAL ECHOCARDIOGRAM (TEE);  Surgeon: Lamar Blinks, MD;  Location: [facility];  Service: Cardiovascular;  Laterality: N/A; Jill Side. Samson Frederic, MA CCC/SLP Clinical Specialist - Acute Care SLP Acute Rehabilitation Services Office number [phone] Blenda Mounts Laurice 04/20/2023, 2:23 PM   MR BRAIN WO CONTRAST Result Date: 04/19/2023 CLINICAL DATA:  Stroke, follow up EXAM: MRI HEAD WITHOUT CONTRAST MRA HEAD WITHOUT CONTRAST TECHNIQUE: Multiplanar, multi-echo pulse sequences of the brain and surrounding structures were acquired without intravenous contrast. Angiographic images of the Circle of Willis were acquired using MRA technique without intravenous contrast. COMPARISON:  None Available. FINDINGS: MRI HEAD FINDINGS Brain: Mild restricted diffusion the right insula. A few small foci of acute infarct in the overlying right frontal cortex and white matter. A few punctate acute infarcts in the cerebellum bilaterally. No substantial mass effect. No midline shift. No acute hemorrhage. No hydrocephalus. Cerebral atrophy. Vascular: See below. Skull and upper cervical spine: Normal marrow signal. Sinuses/Orbits: Clear sinuses.  No acute orbital findings. MRA HEAD FINDINGS Anterior circulation: Bilateral intracranial ICAs, MCAs and ACAs are patent without proximal hemodynamically significant stenosis. Right M1 MCA appears patent. Posterior circulation: Bilateral intradural vertebral arteries, basilar artery and bilateral posterior cerebral arteries are patent. IMPRESSION: 1. Acute infarcts in the right insula, right frontal lobe and the cerebellum bilaterally. 2. No large vessel occlusion.  Right M1 MCA is now patent. Electronically Signed   By: Feliberto Harts M.D.   On: 04/19/2023 21:32    MR ANGIO HEAD WO CONTRAST Result Date: 04/19/2023 CLINICAL DATA:  Stroke,  follow up EXAM: MRI HEAD WITHOUT CONTRAST MRA HEAD WITHOUT CONTRAST TECHNIQUE: Multiplanar, multi-echo pulse sequences of the brain and surrounding structures were acquired without intravenous contrast. Angiographic images of the Circle of Willis were acquired using MRA technique without intravenous contrast. COMPARISON:  None Available. FINDINGS: MRI HEAD FINDINGS Brain: Mild restricted diffusion the right insula. A few small foci of acute infarct in the overlying right frontal cortex and white matter. A few punctate acute infarcts in the cerebellum bilaterally. No substantial mass effect. No midline shift. No acute hemorrhage. No hydrocephalus. Cerebral atrophy. Vascular: See below. Skull and upper cervical spine: Normal marrow signal. Sinuses/Orbits: Clear sinuses.  No acute orbital findings. MRA HEAD FINDINGS Anterior circulation: Bilateral intracranial ICAs, MCAs and ACAs are patent without proximal hemodynamically significant stenosis. Right M1 MCA appears patent. Posterior circulation: Bilateral intradural vertebral arteries, basilar artery and bilateral posterior cerebral arteries are patent. IMPRESSION: 1. Acute infarcts in the right insula, right frontal lobe and the cerebellum bilaterally. 2. No large vessel occlusion.  Right M1 MCA is now patent. Electronically Signed   By: Feliberto Harts M.D.   On: 04/19/2023 21:32           Blood pressure 129/76, pulse 65, temperature 98.3 F (36.8 C), temperature source Oral, resp. rate 17, height 5\' 3"  (1.6 m), weight 47.6 kg,  SpO2 93%.   Medical Problem List and Plan: 1. Functional deficits secondary to right MCA and cerebellar infarct right M1 and ACA occlusion status post IR thrombectomy with TICI3 and TICI2c reperfusion respectively, embolic secondary to A-fib even on Xarelto.             -patient may shower             -ELOS/Goals: 7 to 10 days, supervision PT/OT/SLP  -Stable to admit to inpatient rehab  2.   Antithrombotics: -DVT/anticoagulation:  Pharmaceutical: Eliquis             -antiplatelet therapy: N/A 3. Pain Management: Tylenol as needed 4. Mood/Behavior/Sleep: Provide emotional support             -antipsychotic agents: N/A  -Note, as baseline, she will going to shakes in her upper or lower extremities when she is "anxious"; appears like myoclonus  5. Neuropsych/cognition: This patient is capable of making decisions on her own behalf. 6. Skin/Wound Care: Routine skin checks 7. Fluids/Electrolytes/Nutrition: Routine in and outs with follow-up chemistries 8.  Hyperlipidemia.  Zetia 9.  Hypothyroidism.  Synthroid 10.  GERD.  Protonix 11.  CKD stage III.  Follow-up chemistries 12.  Atrial fibrillation/SVT/pacemaker.  Cardiac rate controlled.  Continue Eliquis. 13.  Recent COVID infection.  Patient remains afebrile 14.  Diastolic congestive heart failure.  Monitor for any signs of fluid overload 15.  History of prior stroke.  Tone and weakness in right upper and lower extremity.   Mcarthur Rossetti Angiulli, PA-C 04/21/2023  I have examined the patient independently and edited the note for HPI, ROS, exam, assessment, and plan as appropriate. I am in agreement with the above recommendations.   Angelina Sheriff, DO 04/21/2023

## 2023-04-21 NOTE — Consult Note (Signed)
 [facility] Liaison Consult Note   04/21/2023  Frances Kent 08-Jun-1930 [id number]  [facility] Patient: Notified 04/20/23 of patient's hospitalization by [facility] RN and   Primary Care Provider:  Sherlene Shams, MD with [facility] at [facility] station, this provider is listed to provide the community transition of care follow up and [facility] calls  Insurance: [employer]  89 y.o. female presents to [facility] hospital on 04/19/2023 with aphasia and L weakness. CTA with critical stenosis of R M1 MCA. Initially, in ICU intubated.  Patient is currently active with [facility] for care coordination services.  Patient has been engaged by a Engineer, manufacturing systems.  The community based plan of care has focused on disease management and community resource support.    Came to bedside, Redge Gainer, and daughter [name] at the bedside. Patient did not awaken, just introduce self. She states that patient's other daughter, Drema Halon DPR] is out with her own medical issues. Reviewed for potential needs and patient has been seen by Palliative team and patient was active with [facility] palliative prior to admission per palliative Plan: Continue to follow for any additional community care coordination needs for post hospital/community needs. Will update [facility] team on new needs as known for post hospital.  Of note, [facility] services does not replace or interfere with any services that are needed or arranged by inpatient [facility] care management team.   Charlesetta Shanks, RN, BSN, CCM [place]  [facility], [facility] Liaison Direct Dial: [phone] or secure chat Email: [email or web address]

## 2023-04-21 NOTE — Plan of Care (Signed)
 Problem: Education: Goal: Knowledge of disease or condition will improve 04/21/2023 0429 by Avie Arenas, RN Outcome: Not Progressing 04/21/2023 0418 by Avie Arenas, RN Outcome: Progressing Goal: Knowledge of secondary prevention will improve (MUST DOCUMENT ALL) 04/21/2023 0429 by Avie Arenas, RN Outcome: Not Progressing 04/21/2023 0418 by Avie Arenas, RN Outcome: Progressing Goal: Knowledge of patient specific risk factors will improve (DELETE if not current risk factor) 04/21/2023 0429 by Avie Arenas, RN Outcome: Not Progressing 04/21/2023 0418 by Avie Arenas, RN Outcome: Progressing   Problem: Ischemic Stroke/TIA Tissue Perfusion: Goal: Complications of ischemic stroke/TIA will be minimized 04/21/2023 0429 by Avie Arenas, RN Outcome: Not Progressing 04/21/2023 0418 by Avie Arenas, RN Outcome: Progressing   Problem: Coping: Goal: Will verbalize positive feelings about self 04/21/2023 0429 by Avie Arenas, RN Outcome: Not Progressing 04/21/2023 0418 by Avie Arenas, RN Outcome: Progressing Goal: Will identify appropriate support needs 04/21/2023 0429 by Avie Arenas, RN Outcome: Not Progressing 04/21/2023 0418 by Avie Arenas, RN Outcome: Progressing   Problem: Health Behavior/Discharge Planning: Goal: Ability to manage health-related needs will improve 04/21/2023 0429 by Avie Arenas, RN Outcome: Not Progressing 04/21/2023 0418 by Avie Arenas, RN Outcome: Progressing Goal: Goals will be collaboratively established with patient/family 04/21/2023 0429 by Avie Arenas, RN Outcome: Not Progressing 04/21/2023 0418 by Avie Arenas, RN Outcome: Progressing   Problem: Self-Care: Goal: Ability to participate in self-care as condition permits will improve 04/21/2023 0429 by Avie Arenas, RN Outcome: Not Progressing 04/21/2023 0418 by Avie Arenas, RN Outcome: Progressing Goal: Verbalization of feelings and  concerns over difficulty with self-care will improve 04/21/2023 0429 by Avie Arenas, RN Outcome: Not Progressing 04/21/2023 0418 by Avie Arenas, RN Outcome: Progressing Goal: Ability to communicate needs accurately will improve 04/21/2023 0429 by Avie Arenas, RN Outcome: Not Progressing 04/21/2023 0418 by Avie Arenas, RN Outcome: Progressing   Problem: Nutrition: Goal: Risk of aspiration will decrease 04/21/2023 0429 by Avie Arenas, RN Outcome: Not Progressing 04/21/2023 0418 by Avie Arenas, RN Outcome: Progressing Goal: Dietary intake will improve 04/21/2023 0429 by Avie Arenas, RN Outcome: Not Progressing 04/21/2023 0418 by Avie Arenas, RN Outcome: Progressing   Problem: Education: Goal: Knowledge of General Education information will improve Description: Including pain rating scale, medication(s)/side effects and non-pharmacologic comfort measures 04/21/2023 0429 by Avie Arenas, RN Outcome: Not Progressing 04/21/2023 0418 by Avie Arenas, RN Outcome: Progressing   Problem: Health Behavior/Discharge Planning: Goal: Ability to manage health-related needs will improve 04/21/2023 0429 by Avie Arenas, RN Outcome: Not Progressing 04/21/2023 0418 by Avie Arenas, RN Outcome: Progressing   Problem: Clinical Measurements: Goal: Ability to maintain clinical measurements within normal limits will improve 04/21/2023 0429 by Avie Arenas, RN Outcome: Not Progressing 04/21/2023 0418 by Avie Arenas, RN Outcome: Progressing Goal: Will remain free from infection 04/21/2023 0429 by Avie Arenas, RN Outcome: Not Progressing 04/21/2023 0418 by Avie Arenas, RN Outcome: Progressing Goal: Diagnostic test results will improve 04/21/2023 0429 by Avie Arenas, RN Outcome: Not Progressing 04/21/2023 0418 by Avie Arenas, RN Outcome: Progressing Goal: Respiratory complications will improve 04/21/2023 0429 by Avie Arenas, RN Outcome: Not Progressing 04/21/2023 0418 by Avie Arenas, RN Outcome: Progressing Goal: Cardiovascular complication will be avoided 04/21/2023 0429 by Avie Arenas, RN Outcome: Not Progressing 04/21/2023 0418 by Avie Arenas, RN Outcome: Progressing   Problem:  Activity: Goal: Risk for activity intolerance will decrease 04/21/2023 0429 by Avie Arenas, RN Outcome: Not Progressing 04/21/2023 0418 by Avie Arenas, RN Outcome: Progressing   Problem: Nutrition: Goal: Adequate nutrition will be maintained 04/21/2023 0429 by Avie Arenas, RN Outcome: Not Progressing 04/21/2023 0418 by Avie Arenas, RN Outcome: Progressing   Problem: Coping: Goal: Level of anxiety will decrease 04/21/2023 0429 by Avie Arenas, RN Outcome: Not Progressing 04/21/2023 0418 by Avie Arenas, RN Outcome: Progressing   Problem: Elimination: Goal: Will not experience complications related to bowel motility 04/21/2023 0429 by Avie Arenas, RN Outcome: Not Progressing 04/21/2023 0418 by Avie Arenas, RN Outcome: Progressing Goal: Will not experience complications related to urinary retention 04/21/2023 0429 by Avie Arenas, RN Outcome: Not Progressing 04/21/2023 0418 by Avie Arenas, RN Outcome: Progressing   Problem: Pain Managment: Goal: General experience of comfort will improve and/or be controlled 04/21/2023 0429 by Avie Arenas, RN Outcome: Not Progressing 04/21/2023 0418 by Avie Arenas, RN Outcome: Progressing   Problem: Safety: Goal: Ability to remain free from injury will improve 04/21/2023 0429 by Avie Arenas, RN Outcome: Not Progressing 04/21/2023 0418 by Avie Arenas, RN Outcome: Progressing   Problem: Skin Integrity: Goal: Risk for impaired skin integrity will decrease 04/21/2023 0429 by Avie Arenas, RN Outcome: Not Progressing 04/21/2023 0418 by Avie Arenas, RN Outcome: Progressing   Problem:  Education: Goal: Understanding of CV disease, CV risk reduction, and recovery process will improve 04/21/2023 0429 by Avie Arenas, RN Outcome: Not Progressing 04/21/2023 0418 by Avie Arenas, RN Outcome: Progressing Goal: Individualized Educational Video(s) 04/21/2023 0429 by Avie Arenas, RN Outcome: Not Progressing 04/21/2023 0418 by Avie Arenas, RN Outcome: Progressing   Problem: Activity: Goal: Ability to return to baseline activity level will improve 04/21/2023 0429 by Avie Arenas, RN Outcome: Not Progressing 04/21/2023 0418 by Avie Arenas, RN Outcome: Progressing   Problem: Cardiovascular: Goal: Ability to achieve and maintain adequate cardiovascular perfusion will improve 04/21/2023 0429 by Avie Arenas, RN Outcome: Not Progressing 04/21/2023 0418 by Avie Arenas, RN Outcome: Progressing Goal: Vascular access site(s) Level 0-1 will be maintained 04/21/2023 0429 by Avie Arenas, RN Outcome: Not Progressing 04/21/2023 0418 by Avie Arenas, RN Outcome: Progressing   Problem: Health Behavior/Discharge Planning: Goal: Ability to safely manage health-related needs after discharge will improve 04/21/2023 0429 by Avie Arenas, RN Outcome: Not Progressing 04/21/2023 0418 by Avie Arenas, RN Outcome: Progressing   Problem: Activity: Goal: Ability to tolerate increased activity will improve 04/21/2023 0429 by Avie Arenas, RN Outcome: Not Progressing 04/21/2023 0418 by Avie Arenas, RN Outcome: Progressing   Problem: Respiratory: Goal: Ability to maintain a clear airway and adequate ventilation will improve 04/21/2023 0429 by Avie Arenas, RN Outcome: Not Progressing 04/21/2023 0418 by Avie Arenas, RN Outcome: Progressing   Problem: Role Relationship: Goal: Method of communication will improve 04/21/2023 0429 by Avie Arenas, RN Outcome: Not Progressing 04/21/2023 0418 by Avie Arenas, RN Outcome:  Progressing

## 2023-04-21 NOTE — Progress Notes (Signed)
 Initial Nutrition Assessment  DOCUMENTATION CODES:   Severe malnutrition in context of chronic illness (CHF, recurrent hospitalizations)  INTERVENTION:  Liberalize diet to regular with chopped meats and gravy as supplement for each meal to help increase options available and provide pt with meat option that is texture appropriate to encourage increased protein intake  Magic cup TID with meals, each supplement provides 290 kcal and 9 grams of protein  NUTRITION DIAGNOSIS:   Severe Malnutrition related to chronic illness (CHF, recurrent hospitalizations) as evidenced by energy intake < or equal to 75% for > or equal to 1 month, severe fat depletion, severe muscle depletion.  GOAL:   Patient will meet greater than or equal to 90% of their needs  MONITOR:   PO intake, Supplement acceptance, Weight trends  REASON FOR ASSESSMENT:   Other (Comment) (low BMI)    ASSESSMENT:   Pt with hx of CHF, HTN, afib w/ pacemaker, hypothyroidism, and GERD. Hx of recent COVID infection requiring hospitalization 2/6 then rehospitalization 2/17 for SOB prior to this admission. Admitted for stroke after experiencing aphasia and left sided weakness.  Spoke with pt and pt's daughter who was at bedside. Pt reports appetite during admission has improved since the first day. Pt reports eating spaghetti and sauce last night for dinner and ate 100% of breakfast this morning which consisted of eggs, coffee, orange juice, and wheat toast. Pt reports she is scared to eat most meats because she doesn't want them to get stuck in esophagus. Discussed ways we can alter meats to make them softer, moister, and smaller pieces to encourage protein intake. Ordered chopped meats for trays and added gravy as supplement to help protein intake while admitted. SLP reports pt passed MBS 3/6 so there are no concerns about swallow, but pt wants to be cautious. Pt refuses ONS due to not liking the taste, discussed adding magic cup to  trays as a supplement, pt agreeable.  PTA, pt had poor appetite. Pt's daughter reports she typically eats breakfast, a small snack in the afternoon and then dinner. Breakfast: 1/2 banana sandwich or egg sandwich; afternoon snack: fruit of some sort; Dinner: sweet potatoes, creamed potatoes, meatloaf (3 days per week), vegetables. Pt's daughter reports pt will eat out occasionally which may include a biscuit and gravy or meatloaf from [employer]. Intake PTA seems to lack protein, discussed ways to incorporate more protein once discharged based on pt's comfort and tolerance. Pt reports no chewing difficulty, only swallowing issues with pills/meats. Pt has previously had esophagus stretched but is still scared to consume certain foods.  Pt's daughter reports pt's typical weight is around 115#, but unable to pinpoint any concrete wt loss since pt has been dealing with fluid accumulation as different time points in the last 6 months so weights in chart may be skewed. Pt has been admitted to hospital 3 times in the last 6 months (plus 2 ED visits).   PTA, pt had been using a walker to get around. Pt lives with daughter and daughter reports having pt get up multiple times per day to encourage movement.   Admit weight: 47.6kg Current weight: 47.6kg   Nutritionally Relevant Medications: Scheduled Meds:  docusate  100 mg Oral BID   levothyroxine  50 mcg Oral Q0600   pantoprazole (PROTONIX) IV  40 mg Intravenous Q24H   PRN Meds: senna-docusate  Labs Reviewed: Hgb 11 Calcium 8.6 HgbA1c 6.2    NUTRITION - FOCUSED PHYSICAL EXAM:  Flowsheet Row Most Recent Value  Orbital  Region Moderate depletion  Upper Arm Region Severe depletion  Thoracic and Lumbar Region Moderate depletion  Buccal Region Moderate depletion  Temple Region Severe depletion  Clavicle Bone Region Severe depletion  Clavicle and Acromion Bone Region Severe depletion  Scapular Bone Region Severe depletion  Dorsal Hand Severe  depletion  Patellar Region Severe depletion  Anterior Thigh Region Severe depletion  Posterior Calf Region Moderate depletion  Edema (RD Assessment) None  Hair Reviewed  [pt reports hair falls out when brushed]  Eyes Reviewed  Mouth Reviewed  Skin Reviewed  Nails Reviewed   Diet Order:   Diet Order             Diet regular Room service appropriate? Yes with Assist; Fluid consistency: Thin  Diet effective now                   EDUCATION NEEDS:   Education needs have been addressed  Skin:  Skin Assessment: Skin Integrity Issues: Skin Integrity Issues:: Stage II Stage II: coccyx  Last BM:  last bm 3/6  Height:   Ht Readings from Last 1 Encounters:  04/19/23 5\' 3"  (1.6 m)    Weight:   Wt Readings from Last 1 Encounters:  04/19/23 47.6 kg    Ideal Body Weight:  52.3 kg  BMI:  Body mass index is 18.59 kg/m.  Estimated Nutritional Needs:   Kcal:  1400-1600  Protein:  60-75g  Fluid:  1.4-1.6L  Louis Meckel Dietetic Intern

## 2023-04-21 NOTE — Discharge Instructions (Signed)

## 2023-04-21 NOTE — Progress Notes (Signed)
 Inpatient Rehab Admissions Coordinator:  There is a bed available in [facility] for pt today. Pt, pt's daughter [name], NSG, and TOC made aware.   Wolfgang Phoenix, MS, CCC-SLP Admissions Coordinator [phone]

## 2023-04-21 NOTE — Discharge Summary (Addendum)
 Stroke Discharge Summary  Patient ID: Frances Kent   MRN: [id number]      DOB: [birth year]  Date of Admission: 04/19/2023 Date of Discharge: 04/21/2023  Attending Physician:  Marvel Plan MD Consultant(s):    rehabilitation medicine  Patient's PCP:  Sherlene Shams, MD  DISCHARGE PRIMARY DIAGNOSIS:  Stroke: right MCA and cerebellar infarcts right M1 and ACA occlusions/p IR with TICI3 and TICI2c respectively, embolic secondary to A-fib even on Xarelto    Other diagnosis Atrial fibrillation History of stroke CHF/cardiomyopathy Hypertension Hyperlipidemia Pacemaker AKI    Allergies as of 04/21/2023       Reactions   Statins Hives   Severe myalgias        Medication List     STOP taking these medications    acetaminophen 500 MG tablet Commonly known as: TYLENOL   ALPRAZolam 0.25 MG tablet Commonly known as: XANAX   levalbuterol 0.63 MG/3ML nebulizer solution Commonly known as: XOPENEX Replaced by: albuterol (2.5 MG/3ML) 0.083% nebulizer solution   lisinopril 20 MG tablet Commonly known as: ZESTRIL   potassium chloride SA 20 MEQ tablet Commonly known as: KLOR-CON M   sertraline 25 MG tablet Commonly known as: ZOLOFT   torsemide 20 MG tablet Commonly known as: DEMADEX   traZODone 50 MG tablet Commonly known as: DESYREL   Xarelto 15 MG Tabs tablet Generic drug: Rivaroxaban       TAKE these medications    albuterol (2.5 MG/3ML) 0.083% nebulizer solution Commonly known as: PROVENTIL Take 3 mLs (2.5 mg total) by nebulization every 4 (four) hours as needed for wheezing or shortness of breath. Replaces: levalbuterol 0.63 MG/3ML nebulizer solution   apixaban 2.5 MG Tabs tablet Commonly known as: ELIQUIS Take 1 tablet (2.5 mg total) by mouth 2 (two) times daily.   docusate 50 MG/5ML liquid Commonly known as: COLACE Take 10 mLs (100 mg total) by mouth 2 (two) times daily.   ezetimibe 10 MG tablet Commonly known as: ZETIA Take 1 tablet (10 mg  total) by mouth daily. Start taking on: April 22, 2023   furosemide 20 MG tablet Commonly known as: LASIX Take 1 tablet (20 mg total) by mouth daily.   levothyroxine 50 MCG tablet Commonly known as: SYNTHROID Take 1 tablet (50 mcg total) by mouth daily at 6 (six) AM. Start taking on: April 22, 2023 What changed: when to take this   metoprolol succinate 25 MG 24 hr tablet Commonly known as: TOPROL-XL Take 1 tablet (25 mg total) by mouth 2 (two) times daily. Take with or immediately following a meal.   pantoprazole 40 MG tablet Commonly known as: PROTONIX Take 1 tablet (40 mg total) by mouth daily. Start taking on: April 22, 2023 What changed:  medication strength how much to take   polyethylene glycol 17 g packet Commonly known as: MIRALAX / GLYCOLAX Take 17 g by mouth daily. Start taking on: April 22, 2023   Probiotic Chew Chew 2 tablets by mouth daily.   senna-docusate 8.6-50 MG tablet Commonly known as: Senokot-S Take 1 tablet by mouth at bedtime as needed for mild constipation.        LABORATORY STUDIES CBC    Component Value Date/Time   WBC 6.9 04/21/2023 0712   RBC 3.85 (L) 04/21/2023 0712   HGB 11.0 (L) 04/21/2023 0712   HGB 14.3 03/22/2014 1229   HCT 34.8 (L) 04/21/2023 0712   HCT 44.0 03/22/2014 1229   PLT 167 04/21/2023 0712   PLT  237 03/22/2014 1229   MCV 90.4 04/21/2023 0712   MCV 91 03/22/2014 1229   MCH 28.6 04/21/2023 0712   MCHC 31.6 04/21/2023 0712   RDW 16.7 (H) 04/21/2023 0712   RDW 14.8 (H) 03/22/2014 1229   LYMPHSABS 2.2 04/19/2023 0208   LYMPHSABS 2.0 02/06/2013 1122   MONOABS 0.6 04/19/2023 0208   MONOABS 0.5 02/06/2013 1122   EOSABS 0.1 04/19/2023 0208   EOSABS 0.1 02/06/2013 1122   BASOSABS 0.1 04/19/2023 0208   BASOSABS 0.1 02/06/2013 1122   CMP    Component Value Date/Time   NA 140 04/21/2023 0712   NA 142 04/14/2023 1421   NA 139 03/22/2014 1229   K 4.5 04/21/2023 0712   K 4.2 03/22/2014 1229   CL 109 04/21/2023 0712    CL 107 03/22/2014 1229   CO2 24 04/21/2023 0712   CO2 25 03/22/2014 1229   GLUCOSE 86 04/21/2023 0712   GLUCOSE 90 03/22/2014 1229   BUN 21 04/21/2023 0712   BUN 28 04/14/2023 1421   BUN 16 03/22/2014 1229   CREATININE 1.38 (H) 04/21/2023 0712   CREATININE 0.90 03/22/2014 1229   CALCIUM 8.6 (L) 04/21/2023 0712   CALCIUM 8.3 (L) 03/22/2014 1229   PROT 6.7 04/19/2023 0208   PROT 7.3 02/05/2013 1425   ALBUMIN 3.6 04/19/2023 0208   ALBUMIN 3.9 02/05/2013 1425   AST 26 04/19/2023 0208   AST 26 02/05/2013 1425   ALT 15 04/19/2023 0208   ALT 22 02/05/2013 1425   ALKPHOS 57 04/19/2023 0208   ALKPHOS 92 02/05/2013 1425   BILITOT 0.5 04/19/2023 0208   BILITOT 0.5 02/05/2013 1425   GFRNONAA 36 (L) 04/21/2023 0712   GFRNONAA >60 03/22/2014 1229   GFRNONAA >60 02/06/2013 1122   GFRAA 51 (L) 08/17/2018 0544   GFRAA >60 03/22/2014 1229   GFRAA >60 02/06/2013 1122   COAGS Lab Results  Component Value Date   INR 4.9 (HH) 04/19/2023   INR 4.4 (HH) 04/19/2023   INR 1.3 (H) 04/03/2023   Lipid Panel    Component Value Date/Time   CHOL 239 (H) 04/19/2023 0548   TRIG 87 04/20/2023 0604   HDL 82 04/19/2023 0548   CHOLHDL 2.9 04/19/2023 0548   VLDL 14 04/19/2023 0548   LDLCALC 143 (H) 04/19/2023 0548   HgbA1C  Lab Results  Component Value Date   HGBA1C 6.2 (H) 04/19/2023   Urine Drug Screen  Alcohol Level    Component Value Date/Time   [facility] <10 04/19/2023 4696     SIGNIFICANT DIAGNOSTIC STUDIES ECHOCARDIOGRAM COMPLETE Result Date: 04/20/2023    ECHOCARDIOGRAM REPORT   Patient Name:   Frances Kent Date of Exam: 04/20/2023 Medical Rec #:  [id number]        Height:       63.0 in Accession #:    [id number]       Weight:       104.9 lb Date of Birth:  11/19/1930        BSA:          1.470 m Patient Age:    89 years         BP:           108/49 mmHg Patient Gender: F                HR:           50 bpm. Exam Location:  Inpatient Procedure: 2D Echo, Cardiac Doppler, Color Doppler and  Intracardiac  Opacification Agent (Both Spectral and Color Flow Doppler were            utilized during procedure). Indications:    Stroke I63.9  History:        Patient has prior history of Echocardiogram examinations, most                 recent 11/28/2022. HFrEF, Stroke, Carotid Disease and CKD 3b,                 Arrythmias:Atrial Fibrillation; Risk Factors:Non-Smoker,                 Hypertension and Dyslipidemia.  Sonographer:    Dondra Prader RVT RCS Referring Phys: [id number] [facility] Nahiara Kretzschmar  Sonographer Comments: Attempted Definity IMPRESSIONS  1. Diffuse hypokinesis, apical akinesis.. Left ventricular ejection fraction, by estimation, is 30 to 35%. The left ventricle has moderately decreased function.  2. Right ventricular systolic function is mildly reduced. The right ventricular size is normal.  3. Left atrial size was severely dilated.  4. Right atrial size was moderately dilated.  5. MV is thickened, difficult to see leaflets well. Peak and mean gradients through the MV are 6 and 2 mm Hg. . Mild to moderate mitral valve regurgitation. Mild mitral stenosis. Moderate mitral annular calcification.  6. The aortic valve is tricuspid. Aortic valve regurgitation is mild to moderate. Aortic valve sclerosis is present, with no evidence of aortic valve stenosis.  7. The inferior vena cava is normal in size with <50% respiratory variability, suggesting right atrial pressure of 8 mmHg. Comparison(s): The left ventricular function is worsened. EF 40-45 %. FINDINGS  Left Ventricle: Diffuse hypokinesis, apical akinesis. Left ventricular ejection fraction, by estimation, is 30 to 35%. The left ventricle has moderately decreased function. Definity contrast agent was given IV to delineate the left ventricular endocardial borders. The left ventricular internal cavity size was normal in size. There is no left ventricular hypertrophy. Right Ventricle: The right ventricular size is normal. Right vetricular wall thickness  was not assessed. Right ventricular systolic function is mildly reduced. Left Atrium: Left atrial size was severely dilated. Right Atrium: Right atrial size was moderately dilated. Pericardium: Trivial pericardial effusion is present. Mitral Valve: MV is thickened, difficult to see leaflets well. Peak and mean gradients through the MV are 6 and 2 mm Hg. Moderate mitral annular calcification. Mild to moderate mitral valve regurgitation. Mild mitral valve stenosis. MV peak gradient, 6.5  mmHg. The mean mitral valve gradient is 2.0 mmHg. Tricuspid Valve: The tricuspid valve is normal in structure. Tricuspid valve regurgitation is mild. Aortic Valve: The aortic valve is tricuspid. Aortic valve regurgitation is mild to moderate. Aortic regurgitation PHT measures 567 msec. Aortic valve sclerosis is present, with no evidence of aortic valve stenosis. Aortic valve mean gradient measures 1.0  mmHg. Aortic valve peak gradient measures 2.7 mmHg. Aortic valve area, by VTI measures 1.95 cm. Pulmonic Valve: The pulmonic valve was normal in structure. Pulmonic valve regurgitation is not visualized. Aorta: The aortic root and ascending aorta are structurally normal, with no evidence of dilitation. Venous: The inferior vena cava is normal in size with less than 50% respiratory variability, suggesting right atrial pressure of 8 mmHg.  LEFT VENTRICLE PLAX 2D LVIDd:         5.00 cm LVIDs:         4.20 cm LV PW:         1.10 cm LV IVS:        0.90 cm LVOT  diam:     1.90 cm LV SV:         38 LV SV Index:   26 LVOT Area:     2.84 cm  RIGHT VENTRICLE            IVC RV Basal diam:  3.30 cm    IVC diam: 2.00 cm RV S prime:     6.86 cm/s TAPSE (M-mode): 1.2 cm LEFT ATRIUM             Index        RIGHT ATRIUM           Index LA diam:        5.10 cm 3.47 cm/m   RA Area:     22.10 cm LA Vol (A2C):   89.5 ml 60.85 ml/m  RA Volume:   72.30 ml  49.18 ml/m LA Vol (A4C):   60.6 ml 41.19 ml/m LA Biplane Vol: 81.7 ml 55.58 ml/m  AORTIC VALVE                     PULMONIC VALVE AV Area (Vmax):    2.19 cm     PV Vmax:       0.67 m/s AV Area (Vmean):   1.95 cm     PV Peak grad:  1.8 mmHg AV Area (VTI):     1.95 cm AV Vmax:           81.50 cm/s AV Vmean:          53.300 cm/s AV VTI:            0.196 m AV Peak Grad:      2.7 mmHg AV Mean Grad:      1.0 mmHg LVOT Vmax:         62.90 cm/s LVOT Vmean:        36.600 cm/s LVOT VTI:          0.135 m LVOT/AV VTI ratio: 0.69 AI PHT:            567 msec AR Vena Contracta: 0.50 cm  AORTA Ao Root diam: 3.00 cm Ao Asc diam:  2.50 cm MITRAL VALVE                TRICUSPID VALVE MV Area (PHT): 2.42 cm     TR Peak grad:   53.9 mmHg MV Area VTI:   0.71 cm     TR Vmax:        367.00 cm/s MV Peak grad:  6.5 mmHg MV Mean grad:  2.0 mmHg     SHUNTS MV Vmax:       1.27 m/s     Systemic VTI:  0.14 m MV Vmean:      66.4 cm/s    Systemic Diam: 1.90 cm MV Decel Time: 313 msec MR PISA:        6.28 cm MR PISA Radius: 1.00 cm MV E velocity: 143.00 cm/s MV A velocity: 38.10 cm/s MV E/A ratio:  3.75 Dietrich Pates MD Electronically signed by Dietrich Pates MD Signature Date/Time: 04/20/2023/3:41:15 PM    Final    DG Swallowing Func-Speech Pathology Result Date: 04/20/2023 Table formatting from the original result was not included. Modified Barium Swallow Study Patient Details Name: Frances Kent MRN: [id number] Date of Birth: 07/02/1930 Today's Date: 04/20/2023 HPI/PMH: HPI: JUANICE WARBURTON is a 89 yo female presenting to ED 3/5 with acute onset of aphasia and L sided weakness. MRI - Acute  infarcts in the right insula, right frontal lobe and the cerebellum bilaterally. Status post IR revascularization 3/5.  MBS 02/2021 - generally functional swallow with some oral holding but no aspiration, no residue. Pt previously seen by SLP in [facility] for cognitive impairments (problem solving, divergent naming, memory retrieval) s/p L basal ganglia CVA 2022. PMH includes A-fib, pacemaker, arthritis, recent COVID infection, GERD, HFrEF, HTN, macular  degeneration, hypothyroidism, mitral regurgitation, PE (2019), prior CVA without residual deficits, SVT Clinical Impression: Clinical Impression: Pt presents with very functional swallowing marked only by occasional oral holding and portioning of bolus that she allows to move into the pharynx a bit at a time.  Laryngeal vestibule closure was reliable with no penetration nor aspiration. Pharyngeal stripping and base of tongue retraction were strong, leading to effective clearance of material through the pharynx. Pt declined to swallow the barium pill, stating that she breaks her pills up at baseline.  Recommend resuming a regular diet, thin liquids. She was told several years ago that she should not use straws - today she drank from a straw normally without any concerns.  Crush meds.Will need assist with feeding due to tremors. No further SLP f/u for swallowing is needed. Conveyed results to RN> Factors that may increase risk of adverse event in presence of aspiration Rubye Oaks & Clearance Coots 2021): No data recorded Recommendations/Plan: Swallowing Evaluation Recommendations Swallowing Evaluation Recommendations Recommendations: PO diet PO Diet Recommendation: Regular; Thin liquids (Level 0) Liquid Administration via: Cup; Straw Medication Administration: Crushed with puree Supervision: Patient able to self-feed; Staff to assist with self-feeding Oral care recommendations: Oral care BID (2x/day) Treatment Plan Treatment Plan Treatment recommendations: No treatment recommended at this time Follow-up recommendations: No SLP follow up Recommendations Recommendations for follow up therapy are one component of a multi-disciplinary discharge planning process, led by the attending physician.  Recommendations may be updated based on patient status, additional functional criteria and insurance authorization. Assessment: Orofacial Exam: Orofacial Exam Oral Cavity - Dentition: Missing dentition; Edentulous Orofacial Anatomy: WFL  Anatomy: Anatomy: WFL Boluses Administered: Boluses Administered Boluses Administered: Thin liquids (Level 0); Puree; Solid  Oral Impairment Domain: Oral Impairment Domain Lip Closure: No labial escape Tongue control during bolus hold: Cohesive bolus between tongue to palatal seal Bolus preparation/mastication: Slow prolonged chewing/mashing with complete recollection Bolus transport/lingual motion: Delayed initiation of tongue motion (oral holding) Oral residue: Complete oral clearance Initiation of pharyngeal swallow : Valleculae  Pharyngeal Impairment Domain: Pharyngeal Impairment Domain Soft palate elevation: No bolus between soft palate (SP)/pharyngeal wall (PW) Laryngeal elevation: Complete superior movement of thyroid cartilage with complete approximation of arytenoids to epiglottic petiole Anterior hyoid excursion: Complete anterior movement Epiglottic movement: Complete inversion Laryngeal vestibule closure: Complete, no air/contrast in laryngeal vestibule Pharyngeal stripping wave : Present - complete Pharyngoesophageal segment opening: Partial distention/partial duration, partial obstruction of flow Tongue base retraction: No contrast between tongue base and posterior pharyngeal wall (PPW) Pharyngeal residue: Complete pharyngeal clearance  Esophageal Impairment Domain: No data recorded Pill: Pill Consistency administered: -- (pt declined, breaks pills into pieces at baseline) Penetration/Aspiration Scale Score: Penetration/Aspiration Scale Score 1.  Material does not enter airway: Thin liquids (Level 0); Puree; Solid Compensatory Strategies: Compensatory Strategies Compensatory strategies: Yes Straw: Effective   General Information: Caregiver present: No  Diet Prior to this Study: NPO   Temperature : Normal   Respiratory Status: WFL   Supplemental O2: None (Room air)   History of Recent Intubation: No  Behavior/Cognition: Alert; Cooperative; Pleasant mood Self-Feeding Abilities: Able to self-feed (tremor)  Baseline vocal quality/speech: Normal Volitional Cough: Able to elicit Volitional Swallow: Able to elicit Exam Limitations: No limitations Goal Planning: Prognosis for improved oropharyngeal function: Good No data recorded No data recorded Patient/Family Stated Goal: nothing specific No data recorded Pain: Pain Assessment Pain Assessment: No/denies pain Facial Expression: 0 Body Movements: 0 Muscle Tension: 0 Compliance with ventilator (intubated pts.): 0 Vocalization (extubated pts.): N/A CPOT Total: 0 End of Session: Start Time:SLP Start Time (ACUTE ONLY): 1440 Stop Time: SLP Stop Time (ACUTE ONLY): 1456 Time Calculation:SLP Time Calculation (min) (ACUTE ONLY): 16 min Charges: SLP Evaluations $ SLP Speech Visit: 1 Visit SLP Evaluations $BSS Swallow: 1 Procedure $MBS Swallow: 1 Procedure $ SLP EVAL LANGUAGE/SOUND PRODUCTION: 1 Procedure SLP visit diagnosis: SLP Visit Diagnosis: Dysphagia, unspecified (R13.10) Past Medical History: Past Medical History: Diagnosis Date  Arthritis   Atrial fibrillation (HCC)   Benign breast cyst in female, left 10/07/2016  Candida esophagitis (HCC) 05/03/2021  Found on Mar 10 2021 EGD   CAP (community acquired pneumonia) 02/15/2021  CHF (congestive heart failure) (HCC)   Colon adenomas   GERD (gastroesophageal reflux disease)   HCAP (healthcare-associated pneumonia) 02/02/2021  Hypertension   Hypothyroidism   Macular degeneration   Mitral regurgitation   Pulmonary embolism (HCC) 02/2016  Severe sepsis (HCC) 02/02/2021  Stroke (HCC) 10/2020  SVT (supraventricular tachycardia) (HCC)  Past Surgical History: Past Surgical History: Procedure Laterality Date  APPENDECTOMY  1960  BREAST CYST ASPIRATION Left 2017  CARDIOVERSION N/A 04/05/2016  Procedure: Cardioversion;  Surgeon: Lamar Blinks, MD;  Location: [facility];  Service: Cardiovascular;  Laterality: N/A;  CHOLECYSTECTOMY  1985  ESOPHAGOGASTRODUODENOSCOPY N/A 03/10/2021  Procedure: ESOPHAGOGASTRODUODENOSCOPY (EGD);  Surgeon: Tol[place],  Boykin Nearing, MD;  Location: [facility];  Service: Gastroenterology;  Laterality: N/A;  ESOPHAGOGASTRODUODENOSCOPY (EGD) WITH PROPOFOL N/A 11/27/2019  Procedure: ESOPHAGOGASTRODUODENOSCOPY (EGD) WITH PROPOFOL;  Surgeon: Regis Bill, MD;  Location: [facility];  Service: Endoscopy;  Laterality: N/A;  ESOPHAGOGASTRODUODENOSCOPY (EGD) WITH PROPOFOL N/A 12/24/2019  Procedure: ESOPHAGOGASTRODUODENOSCOPY (EGD) WITH PROPOFOL;  Surgeon: Regis Bill, MD;  Location: [facility];  Service: Endoscopy;  Laterality: N/A;  IR CT HEAD LTD  11/01/2020  IR CT HEAD LTD  04/19/2023  IR PERCUTANEOUS ART THROMBECTOMY/INFUSION INTRACRANIAL INC DIAG ANGIO  11/01/2020  IR PERCUTANEOUS ART THROMBECTOMY/INFUSION INTRACRANIAL INC DIAG ANGIO  04/19/2023  IR US GUIDE VASC ACCESS RIGHT  11/02/2020  OVARIAN CYST REMOVAL    PACEMAKER LEADLESS INSERTION N/A 11/16/2021  Procedure: PACEMAKER LEADLESS INSERTION;  Surgeon: Marcina Millard, MD;  Location: [facility];  Service: Cardiovascular;  Laterality: N/A;  RADIOLOGY WITH ANESTHESIA N/A 11/01/2020  Procedure: RADIOLOGY WITH ANESTHESIA;  Surgeon: Julieanne Cotton, MD;  Location: [facility] OR;  Service: Radiology;  Laterality: N/A;  RADIOLOGY WITH ANESTHESIA N/A 04/19/2023  Procedure: RADIOLOGY WITH ANESTHESIA;  Surgeon: Radiologist, Medication, MD;  Location: [facility] OR;  Service: Radiology;  Laterality: N/A;  TEE WITHOUT CARDIOVERSION N/A 02/01/2018  Procedure: TRANSESOPHAGEAL ECHOCARDIOGRAM (TEE);  Surgeon: Lamar Blinks, MD;  Location: [facility];  Service: Cardiovascular;  Laterality: N/A; Jill Side. Samson Frederic, MA CCC/SLP Clinical Specialist - Acute Care SLP Acute Rehabilitation Services Office number [phone] Blenda Mounts Laurice 04/20/2023, 2:23 PM  IR PERCUTANEOUS ART THROMBECTOMY/INFUSION INTRACRANIAL INC DIAG ANGIO Result Date: 04/20/2023 INDICATION: New onset left-sided hemiplegia, and aphasia.  Right gaze deviation. Occluded right middle cerebral artery M1  segment on CT angiogram of the head and neck. EXAM: 1. EMERGENT LARGE VESSEL OCCLUSION THROMBOLYSIS (anterior CIRCULATION) COMPARISON:  CT angiogram of the head and neck April 19, 2023.  MEDICATIONS: No antibiotic was administered within 1 hour of the procedure. ANESTHESIA/SEDATION: General anesthesia. CONTRAST:  Omnipaque 300 approximately 70 mL. FLUOROSCOPY TIME:  Fluoroscopy Time: 49 minutes 18 seconds (693 mGy). COMPLICATIONS: None immediate. TECHNIQUE: Following a full explanation of the procedure along with the potential associated complications, an informed witnessed consent was obtained. The risks of intracranial hemorrhage of 10%, worsening neurological deficit, ventilator dependency, death and inability to revascularize were all reviewed in detail with the patient's son. The patient was then put under general anesthesia by the Department of Anesthesiology at [facility]. The right groin was prepped and draped in the usual sterile fashion. Thereafter using modified Seldinger technique, transfemoral access into the right common femoral artery was obtained without difficulty. Over an 0.035 inch guidewire an 8 French 25 cm Pinnacle sheath was inserted. Through this, and also over an 0.035 inch guidewire a combination of an 088 100 cm Zoom support catheter with a 120 cm 6 [place] VDK catheter was advanced to the aortic arch region, and selectively positioned in the right common carotid artery. The support catheter and the guidewire were removed. Good aspiration was obtained from the hub of the 088 Zoom support catheter just inside the right internal carotid artery. A control arteriogram was then performed centered extra cranially and intracranially. FINDINGS: The right external carotid artery and its major branches demonstrate wide patency. The right internal carotid artery at the bulb in its proximal 1/3 demonstrates wide patency. The mid 1/3 of the right ICA demonstrates focal areas of mild smooth  irregularity associated with mild intraluminal narrowing most consistent with moderate FMD. Distal to this, right ICA is widely patent in the petrous segment. Slow ascent of contrast is seen into the supraclinoid right ICA with traces of contrast noted in the right middle cerebral artery proximally. Also noted is flow in the right anterior cerebral A1 segment. PROCEDURE: Through the 088 Zoom aspiration catheter now in the distal 1/3 of the right ICA, a combination of an 062 132 cm Zoom aspiration catheter with a 150 cm Phenom microcatheter was advanced over an 018 inch micro guidewire with a moderate J configuration to the supraclinoid right ICA. The micro guidewire was then gently manipulated using a torque device and advanced through the occluded right middle cerebral artery into the inferior branch of the right middle cerebral artery M2 segment followed by the microcatheter and the 062 Zoom catheter which was advanced into the occluded distal M1 segment. The microcatheter and the micro guidewire were removed as constant aspiration was applied at the hub of the 062 aspiration catheter using a pump for 2 minutes. Thereafter, the 062 aspiration catheter was retrieved and removed. A control arteriogram performed through the 088 Zoom catheter now in the distal petrous segment demonstrated complete opacification of the right anterior cerebral artery achieving a TICI 3 revascularization. The right middle cerebral artery demonstrated opacification of the superior division achieving a TICI 2B revascularization. A second pass was then made again using the above combination this time the micro guidewire and the microcatheter combination advanced into the M2 M3 segment of the inferior division where, the micro guidewire was removed. Good aspiration obtained from the hub of the microcatheter. A gentle contrast injection demonstrated safe positioning of the tip of the microcatheter. This was then connected to continuous saline  infusion. A 4 mm x 40 mm Solitaire X retrieval device was then deployed in the usual manner. The 062 aspiration catheter was then advanced into the proximal inferior division. Constant  aspiration of the 062 aspiration catheter with a pump and aspiration at the hub of the 088 guide catheter was then performed for 2 minutes. Thereafter, the retrieval device, the microcatheter and the 062 aspiration catheter was retrieved and removed. A control arteriogram performed through the support catheter in the right internal carotid artery now demonstrated revascularization of the right MCA distribution. Patency was intact in the right anterior cerebral artery distribution except for an occluded mid A3 segment of the right anterior cerebral artery. A combination of an 062 aspiration catheter with a 0.01 50 cm microcatheter was then advanced over an 018 inch Aristotle micro guidewire to the right M1 segment and then into the right anterior cerebral artery distal A3 segment followed the microcatheter. The guidewire was removed. Good aspiration obtained from the hub of the microcatheter. Aspiration was then applied at the hub of the microcatheter as it was retrieved proximally simultaneously through the 062 aspiration catheter in the proximal A2 segment. The combination was retrieved and removed. A control arteriogram performed through the Zoom 088 in the right internal carotid artery demonstrated revascularization of the distal right anterior cerebral A3 A4 region albeit slow flow. Flow remained sustained distal to this on subsequent control arteriograms. A final control arteriogram performed through the Zoom 088 aspiration catheter in the distal right common carotid artery demonstrated no change in the moderate FMD-like changes in the mid cervical right ICA. More distally, the right middle cerebral artery distribution continues to maintain a TICI 3 revascularization. The Zoom 088 support catheter was removed. An 8 French  Angio-Seal closure device was deployed at the right groin puncture site. Distal pulses remained present bilaterally unchanged from prior to the procedure. A flat panel CT of the brain demonstrated no evidence of hemorrhagic complications. The patient was left intubated due to her medical condition as per anesthesia. She was then transferred to neuro ICU for post revascularization care. IMPRESSION: Status post revascularization of occluded right middle cerebral artery M1 segment in the inferior divisions of the right middle cerebral artery with 1 pass with contact aspiration, and 1 pass with contact aspiration and a 4 mm x 40 mm Solitaire X retrieval device achieving a TICI 3 revascularization. Status post aspiration of proximal A3 segment of the right anterior cerebral artery with 1 pass with contact aspiration achieving a TICI 2C revascularization. PLAN: Per referring MD. Electronically Signed   By: Julieanne Cotton M.D.   On: 04/20/2023 08:27   IR CT Head Ltd Result Date: 04/20/2023 INDICATION: New onset left-sided hemiplegia, and aphasia.  Right gaze deviation. Occluded right middle cerebral artery M1 segment on CT angiogram of the head and neck. EXAM: 1. EMERGENT LARGE VESSEL OCCLUSION THROMBOLYSIS (anterior CIRCULATION) COMPARISON:  CT angiogram of the head and neck April 19, 2023. MEDICATIONS: No antibiotic was administered within 1 hour of the procedure. ANESTHESIA/SEDATION: General anesthesia. CONTRAST:  Omnipaque 300 approximately 70 mL. FLUOROSCOPY TIME:  Fluoroscopy Time: 49 minutes 18 seconds (693 mGy). COMPLICATIONS: None immediate. TECHNIQUE: Following a full explanation of the procedure along with the potential associated complications, an informed witnessed consent was obtained. The risks of intracranial hemorrhage of 10%, worsening neurological deficit, ventilator dependency, death and inability to revascularize were all reviewed in detail with the patient's son. The patient was then put under  general anesthesia by the Department of Anesthesiology at [facility]. The right groin was prepped and draped in the usual sterile fashion. Thereafter using modified Seldinger technique, transfemoral access into the right common femoral artery was  obtained without difficulty. Over an 0.035 inch guidewire an 8 French 25 cm Pinnacle sheath was inserted. Through this, and also over an 0.035 inch guidewire a combination of an 088 100 cm Zoom support catheter with a 120 cm 6 [place] VDK catheter was advanced to the aortic arch region, and selectively positioned in the right common carotid artery. The support catheter and the guidewire were removed. Good aspiration was obtained from the hub of the 088 Zoom support catheter just inside the right internal carotid artery. A control arteriogram was then performed centered extra cranially and intracranially. FINDINGS: The right external carotid artery and its major branches demonstrate wide patency. The right internal carotid artery at the bulb in its proximal 1/3 demonstrates wide patency. The mid 1/3 of the right ICA demonstrates focal areas of mild smooth irregularity associated with mild intraluminal narrowing most consistent with moderate FMD. Distal to this, right ICA is widely patent in the petrous segment. Slow ascent of contrast is seen into the supraclinoid right ICA with traces of contrast noted in the right middle cerebral artery proximally. Also noted is flow in the right anterior cerebral A1 segment. PROCEDURE: Through the 088 Zoom aspiration catheter now in the distal 1/3 of the right ICA, a combination of an 062 132 cm Zoom aspiration catheter with a 150 cm Phenom microcatheter was advanced over an 018 inch micro guidewire with a moderate J configuration to the supraclinoid right ICA. The micro guidewire was then gently manipulated using a torque device and advanced through the occluded right middle cerebral artery into the inferior branch of the right  middle cerebral artery M2 segment followed by the microcatheter and the 062 Zoom catheter which was advanced into the occluded distal M1 segment. The microcatheter and the micro guidewire were removed as constant aspiration was applied at the hub of the 062 aspiration catheter using a pump for 2 minutes. Thereafter, the 062 aspiration catheter was retrieved and removed. A control arteriogram performed through the 088 Zoom catheter now in the distal petrous segment demonstrated complete opacification of the right anterior cerebral artery achieving a TICI 3 revascularization. The right middle cerebral artery demonstrated opacification of the superior division achieving a TICI 2B revascularization. A second pass was then made again using the above combination this time the micro guidewire and the microcatheter combination advanced into the M2 M3 segment of the inferior division where, the micro guidewire was removed. Good aspiration obtained from the hub of the microcatheter. A gentle contrast injection demonstrated safe positioning of the tip of the microcatheter. This was then connected to continuous saline infusion. A 4 mm x 40 mm Solitaire X retrieval device was then deployed in the usual manner. The 062 aspiration catheter was then advanced into the proximal inferior division. Constant aspiration of the 062 aspiration catheter with a pump and aspiration at the hub of the 088 guide catheter was then performed for 2 minutes. Thereafter, the retrieval device, the microcatheter and the 062 aspiration catheter was retrieved and removed. A control arteriogram performed through the support catheter in the right internal carotid artery now demonstrated revascularization of the right MCA distribution. Patency was intact in the right anterior cerebral artery distribution except for an occluded mid A3 segment of the right anterior cerebral artery. A combination of an 062 aspiration catheter with a 0.01 50 cm microcatheter  was then advanced over an 018 inch Aristotle micro guidewire to the right M1 segment and then into the right anterior cerebral artery distal A3 segment  followed the microcatheter. The guidewire was removed. Good aspiration obtained from the hub of the microcatheter. Aspiration was then applied at the hub of the microcatheter as it was retrieved proximally simultaneously through the 062 aspiration catheter in the proximal A2 segment. The combination was retrieved and removed. A control arteriogram performed through the Zoom 088 in the right internal carotid artery demonstrated revascularization of the distal right anterior cerebral A3 A4 region albeit slow flow. Flow remained sustained distal to this on subsequent control arteriograms. A final control arteriogram performed through the Zoom 088 aspiration catheter in the distal right common carotid artery demonstrated no change in the moderate FMD-like changes in the mid cervical right ICA. More distally, the right middle cerebral artery distribution continues to maintain a TICI 3 revascularization. The Zoom 088 support catheter was removed. An 8 French Angio-Seal closure device was deployed at the right groin puncture site. Distal pulses remained present bilaterally unchanged from prior to the procedure. A flat panel CT of the brain demonstrated no evidence of hemorrhagic complications. The patient was left intubated due to her medical condition as per anesthesia. She was then transferred to neuro ICU for post revascularization care. IMPRESSION: Status post revascularization of occluded right middle cerebral artery M1 segment in the inferior divisions of the right middle cerebral artery with 1 pass with contact aspiration, and 1 pass with contact aspiration and a 4 mm x 40 mm Solitaire X retrieval device achieving a TICI 3 revascularization. Status post aspiration of proximal A3 segment of the right anterior cerebral artery with 1 pass with contact aspiration  achieving a TICI 2C revascularization. PLAN: Per referring MD. Electronically Signed   By: Julieanne Cotton M.D.   On: 04/20/2023 08:27   MR BRAIN WO CONTRAST Result Date: 04/19/2023 CLINICAL DATA:  Stroke, follow up EXAM: MRI HEAD WITHOUT CONTRAST MRA HEAD WITHOUT CONTRAST TECHNIQUE: Multiplanar, multi-echo pulse sequences of the brain and surrounding structures were acquired without intravenous contrast. Angiographic images of the Circle of Willis were acquired using MRA technique without intravenous contrast. COMPARISON:  None Available. FINDINGS: MRI HEAD FINDINGS Brain: Mild restricted diffusion the right insula. A few small foci of acute infarct in the overlying right frontal cortex and white matter. A few punctate acute infarcts in the cerebellum bilaterally. No substantial mass effect. No midline shift. No acute hemorrhage. No hydrocephalus. Cerebral atrophy. Vascular: See below. Skull and upper cervical spine: Normal marrow signal. Sinuses/Orbits: Clear sinuses.  No acute orbital findings. MRA HEAD FINDINGS Anterior circulation: Bilateral intracranial ICAs, MCAs and ACAs are patent without proximal hemodynamically significant stenosis. Right M1 MCA appears patent. Posterior circulation: Bilateral intradural vertebral arteries, basilar artery and bilateral posterior cerebral arteries are patent. IMPRESSION: 1. Acute infarcts in the right insula, right frontal lobe and the cerebellum bilaterally. 2. No large vessel occlusion.  Right M1 MCA is now patent. Electronically Signed   By: Feliberto Harts M.D.   On: 04/19/2023 21:32   MR ANGIO HEAD WO CONTRAST Result Date: 04/19/2023 CLINICAL DATA:  Stroke, follow up EXAM: MRI HEAD WITHOUT CONTRAST MRA HEAD WITHOUT CONTRAST TECHNIQUE: Multiplanar, multi-echo pulse sequences of the brain and surrounding structures were acquired without intravenous contrast. Angiographic images of the Circle of Willis were acquired using MRA technique without intravenous  contrast. COMPARISON:  None Available. FINDINGS: MRI HEAD FINDINGS Brain: Mild restricted diffusion the right insula. A few small foci of acute infarct in the overlying right frontal cortex and white matter. A few punctate acute infarcts in the cerebellum bilaterally. No  substantial mass effect. No midline shift. No acute hemorrhage. No hydrocephalus. Cerebral atrophy. Vascular: See below. Skull and upper cervical spine: Normal marrow signal. Sinuses/Orbits: Clear sinuses.  No acute orbital findings. MRA HEAD FINDINGS Anterior circulation: Bilateral intracranial ICAs, MCAs and ACAs are patent without proximal hemodynamically significant stenosis. Right M1 MCA appears patent. Posterior circulation: Bilateral intradural vertebral arteries, basilar artery and bilateral posterior cerebral arteries are patent. IMPRESSION: 1. Acute infarcts in the right insula, right frontal lobe and the cerebellum bilaterally. 2. No large vessel occlusion.  Right M1 MCA is now patent. Electronically Signed   By: Feliberto Harts M.D.   On: 04/19/2023 21:32   DG Abd 1 View Result Date: 04/19/2023 CLINICAL DATA:  Orogastric tube placement. EXAM: ABDOMEN - 1 VIEW COMPARISON:  None Available. FINDINGS: Distal tip of nasogastric tube is seen in expected position of stomach. No abnormal bowel dilatation. Possible gastrostomy tube is noted. IMPRESSION: Distal tip of nasogastric tube is seen in expected position of stomach. Electronically Signed   By: Lupita Raider M.D.   On: 04/19/2023 08:38   Portable Chest x-ray Result Date: 04/19/2023 CLINICAL DATA:  Endotracheal tube. EXAM: PORTABLE CHEST 1 VIEW COMPARISON:  April 02, 2023. FINDINGS: Stable cardiomegaly. Endotracheal and nasogastric tubes are in grossly good position. Minimal right basilar subsegmental atelectasis is noted. Mild left retrocardiac opacity is noted concerning for atelectasis or infiltrate with probable small pleural effusion. Bony thorax is unremarkable. IMPRESSION:  Support apparatus as noted above. Left retrocardiac opacity as noted above. Minimal right basilar atelectasis. Electronically Signed   By: Lupita Raider M.D.   On: 04/19/2023 08:36   CT ANGIO HEAD NECK W WO CM (CODE STROKE) Result Date: 04/19/2023 EXAM: CT ANGIOGRAPHY HEAD AND NECK WITH AND WITHOUT CONTRAST TECHNIQUE: Multidetector CT imaging of the head and neck was performed using the standard protocol during bolus administration of intravenous contrast. Multiplanar CT image reconstructions and MIPs were obtained to evaluate the vascular anatomy. Carotid stenosis measurements (when applicable) are obtained utilizing NASCET criteria, using the distal internal carotid diameter as the denominator. RADIATION DOSE REDUCTION: This exam was performed according to the departmental dose-optimization program which includes automated exposure control, adjustment of the mA and/or kV according to patient size and/or use of iterative reconstruction technique. CONTRAST:  75mL OMNIPAQUE IOHEXOL 350 MG/ML SOLN COMPARISON:  CT head from today. FINDINGS: CTA NECK FINDINGS Aortic arch: Great vessel origins are patent without significant stenosis. Right carotid system: No evidence of dissection, stenosis (50% or greater), or occlusion. Mildly diminished opacification the right ICA in the neck likely relates to the intracranial occlusion described below. Left carotid system: No evidence of dissection, stenosis (50% or greater), or occlusion. Vertebral arteries: Codominant. No evidence of dissection, stenosis (50% or greater), or occlusion. Skeleton: No acute abnormality on limited assessment. Other neck: No acute abnormality on limited assessment. Upper chest: Visualized lung apices are clear. Review of the MIP images confirms the above findings CTA HEAD FINDINGS Anterior circulation: Bilateral intracranial ICAs are patent without significant stenosis. Occlusion versus critical stenosis of the right M1 MCA with asymmetrically  diminished opacification of more distal right MCA vessels. Suspected thrombus is visualized within the M1 MCA and extends into proximal M2 MCA branches. Left MCAs and bilateral ACAs are patent. Posterior circulation: Bilateral intradural vertebral arteries, basilar artery and bilateral posterior arteries are patent without proximal in a mic least significant stenosis. Venous sinuses: Not well assessed due to arterial timing. Review of the MIP images confirms the above findings  IMPRESSION: Occlusion versus critical stenosis of the right M1 MCA with asymmetrically diminished opacification of more distal right MCA vessels. Suspected thrombus is visualized within the M1 MCA and extends into proximal M2 MCA branches. Findings discussed with Dr. Otelia Limes via telephone at 2:26 a.m. Electronically Signed   By: Feliberto Harts M.D.   On: 04/19/2023 02:37   CT HEAD CODE STROKE WO CONTRAST Result Date: 04/19/2023 CLINICAL DATA:  Code stroke.  Neuro deficit, acute, stroke suspected EXAM: CT HEAD WITHOUT CONTRAST TECHNIQUE: Contiguous axial images were obtained from the base of the skull through the vertex without intravenous contrast. RADIATION DOSE REDUCTION: This exam was performed according to the departmental dose-optimization program which includes automated exposure control, adjustment of the mA and/or kV according to patient size and/or use of iterative reconstruction technique. COMPARISON:  CT March 23, 2023. FINDINGS: Brain: No evidence of acute large vascular territory infarction, hemorrhage, hydrocephalus, extra-axial collection or mass lesion/mass effect. Vascular: No hyperdense vessel. Skull: No acute fracture. Sinuses/Orbits: Left sphenoid sinus frothy secretions. No acute orbital findings. Other: No mastoid effusions. ASPECTS [facility] Stroke Program Early CT Score) Total score (0-10 with 10 being normal): 10. IMPRESSION: No evidence of acute intracranial abnormality. Code stroke imaging results were  communicated on 04/19/2023 at 2:18 am to provider Dr. Otelia Limes via secure text paging. Electronically Signed   By: Feliberto Harts M.D.   On: 04/19/2023 02:18   CT CHEST WO CONTRAST Result Date: 04/03/2023 CLINICAL DATA:  Chronic dyspnea with unclear etiology EXAM: CT CHEST WITHOUT CONTRAST TECHNIQUE: Multidetector CT imaging of the chest was performed following the standard protocol without IV contrast. RADIATION DOSE REDUCTION: This exam was performed according to the departmental dose-optimization program which includes automated exposure control, adjustment of the mA and/or kV according to patient size and/or use of iterative reconstruction technique. COMPARISON:  Radiograph from yesterday FINDINGS: Cardiovascular: Cardiac enlargement. Small low-density pericardial effusion measuring up to 9 mm in thickness posteriorly. There is atheromatous calcification. Prominent mitral annular calcification. Lead less pacer at the right ventricular apex. Mediastinum/Nodes: No worrisome lymph node enlargement, mass, or esophageal thickening. Lungs/Pleura: Biapical pleural based scarring. Central airways are clear. Minor dependent atelectasis. There is no consolidation, effusion, or pneumothorax. Slight interlobular septal thickening best seen on coronal reformats. Upper Abdomen: Cholecystectomy clips and atheromatous calcification. Musculoskeletal: T11 compression fracture with moderate height loss, nonacute and known from 2023 MRI. IMPRESSION: 1. Slight thickening of the interstitium may be minimal pulmonary edema. There is chronic cardiomegaly with small pericardial effusion. 2. Negative for pneumonia. Electronically Signed   By: Tiburcio Pea M.D.   On: 04/03/2023 07:28   DG Chest 2 View Result Date: 04/02/2023 CLINICAL DATA:  Dyspnea, atrial fibrillation EXAM: CHEST - 2 VIEW COMPARISON:  03/23/2023 FINDINGS: The lungs are symmetrically well expanded. Small left pleural effusion is present. Chronic interstitial  thickening again noted though superimposed interstitial pulmonary edema has resolved. No pneumothorax. Stable cardiomegaly. Leadless pacemaker again noted. No acute bone abnormality. IMPRESSION: 1. Small left pleural effusion. 2. Cardiomegaly. 3. Resolved pulmonary edema Electronically Signed   By: Helyn Numbers M.D.   On: 04/02/2023 19:41   DG Chest 1 View Result Date: 03/23/2023 CLINICAL DATA:  89 year old female with altered mental status, CHF. EXAM: CHEST  1 VIEW COMPARISON:  Portable chest yesterday and earlier. FINDINGS: Portable AP semi upright view at 0900 hours. Mildly improved lung volumes. Stable cardiomegaly and mediastinal contours. Left chest loop recorder or superficial ICD redemonstrated. Small pleural effusions are less apparent, lung base ventilation mildly  improved. Stable pulmonary vascularity which has regressed since last month. No pneumothorax. No areas of worsening ventilation. Stable visualized osseous structures. Negative visible bowel gas. IMPRESSION: Cardiomegaly with mildly improved lung volumes and lung base ventilation. Suspected small pleural effusions less apparent. Stable vascular congestion, improved from last month. Electronically Signed   By: Odessa Fleming M.D.   On: 03/23/2023 09:20   CT HEAD WO CONTRAST ( ) Result Date: 03/23/2023 CLINICAL DATA:  Altered mental status EXAM: CT HEAD WITHOUT CONTRAST TECHNIQUE: Contiguous axial images were obtained from the base of the skull through the vertex without intravenous contrast. RADIATION DOSE REDUCTION: This exam was performed according to the departmental dose-optimization program which includes automated exposure control, adjustment of the mA and/or kV according to patient size and/or use of iterative reconstruction technique. COMPARISON:  None Available. FINDINGS: Brain: There is no mass, hemorrhage or extra-axial collection. There is generalized atrophy without lobar predilection. Hypodensity of the white matter is most commonly  associated with chronic microvascular disease. Vascular: No hyperdense vessel or unexpected vascular calcification. Skull: The visualized skull base, calvarium and extracranial soft tissues are normal. Sinuses/Orbits: No fluid levels or advanced mucosal thickening of the visualized paranasal sinuses. No mastoid or middle ear effusion. Normal orbits. Other: None. IMPRESSION: 1. No acute intracranial abnormality. 2. Generalized atrophy and findings of chronic microvascular disease. Electronically Signed   By: Deatra Robinson M.D.   On: 03/23/2023 03:12   DG Chest Port 1 View Result Date: 03/22/2023 CLINICAL DATA:  Cough EXAM: PORTABLE CHEST 1 VIEW COMPARISON:  03/14/2023 FINDINGS: Moderate cardiomegaly with multifocal interstitial opacity, unchanged. No pleural effusion or pneumothorax. No focal airspace consolidation. IMPRESSION: Moderate cardiomegaly with multifocal interstitial opacity, unchanged and likely chronic. Electronically Signed   By: Deatra Robinson M.D.   On: 03/22/2023 22:36       HISTORY OF PRESENT ILLNESS 89 y.o. patient with history of A-fib on Xarelto, pacemaker, recent COVID infection, HF PEF, hypertension, PE 2019, SVT, stroke admitted for left-sided weakness and aphasia. No TNK given due to on Xarelto. Went to [facility] on 3/5 for mechanical thrombectomy of right MCA and ACA occlusion.   HOSPITAL COURSE Stroke: right MCA and cerebellar infarcts right M1 and ACA occlusions/p IR with TICI3 and TICI2c respectively, embolic secondary to A-fib even on Xarelto  CT no acute abnormality CT head and neck right M1 occlusion with distal reconstitution IR showed right MCA and ACA occlusion status post TICI3 MCA and TICI2c ACA. MRI acute infarcts in the right insula, right frontal lobe and cerebellum bilaterally MRA right M1 MCA now patent 2D Echo EF 30-35% LDL 143 HgbA1c 6.2 UDS negative Lovenox for VTE prophylaxis Xarelto (rivaroxaban) daily prior to admission, now on Eliquis  Ongoing aggressive  stroke risk factor management Therapy recommendations: CIR Disposition: Pending   A-fib Cardiomyopathy On Xarelto PTA Eliquis started 3/6 EF 30 to 35%   History of stroke 10/2020 admitted for left CR infarct with left ICA and MCA occlusion status post IR with TICI3.  CT head and neck T occlusion with recon at left MCA and ACA.  EF 40 to 45%, LDL 81, A1c 5.8.  Continue on Xarelto but recommend taking Xarelto with food   Hypertension Home Meds: Lasix 20 mg, lisinopril 20 mg, metoprolol succinate 25 mg twice daily, torsemide (not taking) Stable Long term BP goal normotensive   Hyperlipidemia Home meds: None LDL 143, goal < 70 Statin allergy Start Zetia today May consider Leqvio   Other Stroke Risk Factors Advanced age SVT PE  in 2019   Other Active Problems Pacemaker present History of COVID infection AKI, creatinine 1.40--1.39--1.38  RN Pressure Injury Documentation: Pressure Injury 04/19/23 Coccyx Medial Stage 2 -  Partial thickness loss of dermis presenting as a shallow open injury with a red, pink wound bed without slough. (Active)  04/19/23 0534  Location: Coccyx  Location Orientation: Medial  Staging: Stage 2 -  Partial thickness loss of dermis presenting as a shallow open injury with a red, pink wound bed without slough.  Wound Description (Comments):   Present on Admission: Yes  Dressing Type Foam - Lift dressing to assess site every shift 04/20/23 2005     DISCHARGE EXAM  PHYSICAL EXAM General:  Alert, well-nourished, well-developed patient in no acute distress Psych:  Mood and affect appropriate for situation CV: Regular rate and rhythm on monitor Respiratory:  Regular, unlabored respirations on room air GI: Abdomen soft and nontender  NEURO:  Mental Status: AA&Ox3  Speech/Language: speech is without dysarthria or aphasia.  Naming, repetition, fluency, and comprehension intact.  Cranial Nerves:  awake, alert, eyes open, orientated to place time and  people and age. No gaze palsy, tracking bilaterally, visual field full, PERRL.  Facial symmetric. Tongue midline.  Bilateral UEs 4/5, no drift. Bilaterally LEs 3/5.  Sensation symmetric and b/l FTN grossly intact Gait not tested.       1a Level of Conscious.: 0 1b LOC Questions: 0 1c LOC Commands: 0 2 Best Gaze: 0 3 Visual: 0 4 Facial Palsy: 0 5a Motor Arm - left: 0 5b Motor Arm - Right: 0 6a Motor Leg - Left: 0 6b Motor Leg - Right: 0 7 Limb Ataxia: 0 8 Sensory: 0 9 Best Language: 0 10 Dysarthria: 0 11 Extinct. and Inatten.: 0 TOTAL: 0   Discharge Diet       Diet   Diet regular Room service appropriate? Yes with Assist; Fluid consistency: Thin   liquids  DISCHARGE PLAN Disposition: Rehab Eliquis (apixaban) daily for secondary stroke prevention Ongoing stroke risk factor control by Primary Care Physician at time of discharge Follow-up PCP Sherlene Shams, MD in 2 weeks. Follow up with Cardiology Outpatient  Follow-up in [facility] in 8 weeks, office to schedule an appointment. Able to see NP in clinic.  35 minutes were spent preparing discharge.  Patient seen and examined by NP/APP with MD. MD to update note as needed.   Elmer Picker, DNP, FNP-BC [facility] Pager: [phone]  ATTENDING NOTE: I reviewed above note and agree with the assessment and plan. Pt was seen and examined.   Daughter at bedside.  Patient lying in bed, no acute event overnight, neuro stable and intact.  On Eliquis and Zetia.  PT and OT recommend CIR.  Medically ready for CIR placement today.  For detailed assessment and plan, please refer to above/below as I have made changes wherever appropriate.   Marvel Plan, MD PhD Stroke Neurology 04/21/2023 11:14 PM

## 2023-04-21 NOTE — Progress Notes (Signed)
 Inpatient Rehabilitation Admission Medication Review by a Pharmacist  A complete drug regimen review was completed for this patient to identify any potential clinically significant medication issues.  High Risk Drug Classes Is patient taking? Indication by Medication  Antipsychotic No   Anticoagulant Yes Apixaban - AFib, hx PE  Antibiotic No   Opioid No   Antiplatelet No   Hypoglycemics/insulin No   Vasoactive Medication No   Chemotherapy No   Other Yes Albuterol - prn wheeze APAP - prn pain Docusate / Miralax / Senna - bowel regimen Ezetimibe - HLD Levothyroxine - hypothyroidism Pantoprazole - GERD     Type of Medication Issue Identified Description of Issue Recommendation(s)  Drug Interaction(s) (clinically significant)     Duplicate Therapy     Allergy     No Medication Administration End Date     Incorrect Dose     Additional Drug Therapy Needed  Lasix 20mg  daily Metoprolol Succinate 25mg  BID These medications were recommended to start on discharge from acute.  Follow-up if appropriate to restart during rehab stay.  Significant med changes from prior encounter (inform family/care partners about these prior to discharge). STOP Levalbuterol, Trazodone, Rivaroxaban   Other       Clinically significant medication issues were identified that warrant physician communication and completion of prescribed/recommended actions by midnight of the next day:  No  Name of provider notified for urgent issues identified:   Provider Method of Notification:     Pharmacist comments: Several medications were changed on discharge from acute.  I have added these medications to the PTA med list so the patient knows to stop them on discharge from inpatient rehab.  Time spent performing this drug regimen review (minutes):  25   Alylah Blakney, Judie Bonus 04/21/2023 5:37 PM

## 2023-04-22 ENCOUNTER — Telehealth: Payer: Self-pay | Admitting: Nurse Practitioner

## 2023-04-22 DIAGNOSIS — I63511 Cerebral infarction due to unspecified occlusion or stenosis of right middle cerebral artery: Secondary | ICD-10-CM | POA: Diagnosis not present

## 2023-04-22 MED ORDER — FUROSEMIDE 20 MG PO TABS
10.0000 mg | ORAL_TABLET | Freq: Every day | ORAL | Status: DC
Start: 2023-04-22 — End: 2023-04-26
  Administered 2023-04-22 – 2023-04-26 (×5): 10 mg via ORAL
  Filled 2023-04-22 (×5): qty 1

## 2023-04-22 NOTE — Discharge Summary (Signed)
 Physician Discharge Summary  Patient ID: Frances Kent MRN: [id number] DOB/AGE: 1930-02-16 89 y.o.  Admit date: 04/21/2023 Discharge date: 04/26/2023  Discharge Diagnoses:  Principal Problem:   Right middle cerebral artery stroke [facility]) Atrial fibrillation Hyperlipidemia Hypothyroidism GERD CKD stage III Recent COVID infection Diastolic congestive heart failure History of prior CVA History of prior pulmonary emboli Macular degeneration  Discharged Condition: Stable  Significant Diagnostic Studies: ECHOCARDIOGRAM COMPLETE Result Date: 04/20/2023    ECHOCARDIOGRAM REPORT   Patient Name:   Frances Kent Date of Exam: 04/20/2023 Medical Rec #:  [id number]        Height:       63.0 in Accession #:    [id number]       Weight:       104.9 lb Date of Birth:  [birth year]        BSA:          1.470 m Patient Age:    89 years         BP:           108/49 mmHg Patient Gender: F                HR:           50 bpm. Exam Location:  Inpatient Procedure: 2D Echo, Cardiac Doppler, Color Doppler and Intracardiac            Opacification Agent (Both Spectral and Color Flow Doppler were            utilized during procedure). Indications:    Stroke I63.9  History:        Patient has prior history of Echocardiogram examinations, most                 recent 11/28/2022. HFrEF, Stroke, Carotid Disease and CKD 3b,                 Arrythmias:Atrial Fibrillation; Risk Factors:Non-Smoker,                 Hypertension and Dyslipidemia.  Sonographer:    Dondra Prader RVT RCS Referring Phys: [id number] [facility] XU  Sonographer Comments: Attempted Definity IMPRESSIONS  1. Diffuse hypokinesis, apical akinesis.. Left ventricular ejection fraction, by estimation, is 30 to 35%. The left ventricle has moderately decreased function.  2. Right ventricular systolic function is mildly reduced. The right ventricular size is normal.  3. Left atrial size was severely dilated.  4. Right atrial size was moderately dilated.  5. MV is thickened,  difficult to see leaflets well. Peak and mean gradients through the MV are 6 and 2 mm Hg. . Mild to moderate mitral valve regurgitation. Mild mitral stenosis. Moderate mitral annular calcification.  6. The aortic valve is tricuspid. Aortic valve regurgitation is mild to moderate. Aortic valve sclerosis is present, with no evidence of aortic valve stenosis.  7. The inferior vena cava is normal in size with <50% respiratory variability, suggesting right atrial pressure of 8 mmHg. Comparison(s): The left ventricular function is worsened. EF 40-45 %. FINDINGS  Left Ventricle: Diffuse hypokinesis, apical akinesis. Left ventricular ejection fraction, by estimation, is 30 to 35%. The left ventricle has moderately decreased function. Definity contrast agent was given IV to delineate the left ventricular endocardial borders. The left ventricular internal cavity size was normal in size. There is no left ventricular hypertrophy. Right Ventricle: The right ventricular size is normal. Right vetricular wall thickness was not assessed. Right ventricular systolic function is mildly reduced. Left Atrium: Left  atrial size was severely dilated. Right Atrium: Right atrial size was moderately dilated. Pericardium: Trivial pericardial effusion is present. Mitral Valve: MV is thickened, difficult to see leaflets well. Peak and mean gradients through the MV are 6 and 2 mm Hg. Moderate mitral annular calcification. Mild to moderate mitral valve regurgitation. Mild mitral valve stenosis. MV peak gradient, 6.5  mmHg. The mean mitral valve gradient is 2.0 mmHg. Tricuspid Valve: The tricuspid valve is normal in structure. Tricuspid valve regurgitation is mild. Aortic Valve: The aortic valve is tricuspid. Aortic valve regurgitation is mild to moderate. Aortic regurgitation PHT measures 567 msec. Aortic valve sclerosis is present, with no evidence of aortic valve stenosis. Aortic valve mean gradient measures 1.0  mmHg. Aortic valve peak gradient  measures 2.7 mmHg. Aortic valve area, by VTI measures 1.95 cm. Pulmonic Valve: The pulmonic valve was normal in structure. Pulmonic valve regurgitation is not visualized. Aorta: The aortic root and ascending aorta are structurally normal, with no evidence of dilitation. Venous: The inferior vena cava is normal in size with less than 50% respiratory variability, suggesting right atrial pressure of 8 mmHg.  LEFT VENTRICLE PLAX 2D LVIDd:         5.00 cm LVIDs:         4.20 cm LV PW:         1.10 cm LV IVS:        0.90 cm LVOT diam:     1.90 cm LV SV:         38 LV SV Index:   26 LVOT Area:     2.84 cm  RIGHT VENTRICLE            IVC RV Basal diam:  3.30 cm    IVC diam: 2.00 cm RV S prime:     6.86 cm/s TAPSE (M-mode): 1.2 cm LEFT ATRIUM             Index        RIGHT ATRIUM           Index LA diam:        5.10 cm 3.47 cm/m   RA Area:     22.10 cm LA Vol (A2C):   89.5 ml 60.85 ml/m  RA Volume:   72.30 ml  49.18 ml/m LA Vol (A4C):   60.6 ml 41.19 ml/m LA Biplane Vol: 81.7 ml 55.58 ml/m  AORTIC VALVE                    PULMONIC VALVE AV Area (Vmax):    2.19 cm     PV Vmax:       0.67 m/s AV Area (Vmean):   1.95 cm     PV Peak grad:  1.8 mmHg AV Area (VTI):     1.95 cm AV Vmax:           81.50 cm/s AV Vmean:          53.300 cm/s AV VTI:            0.196 m AV Peak Grad:      2.7 mmHg AV Mean Grad:      1.0 mmHg LVOT Vmax:         62.90 cm/s LVOT Vmean:        36.600 cm/s LVOT VTI:          0.135 m LVOT/AV VTI ratio: 0.69 AI PHT:            567 msec AR Vena  Contracta: 0.50 cm  AORTA Ao Root diam: 3.00 cm Ao Asc diam:  2.50 cm MITRAL VALVE                TRICUSPID VALVE MV Area (PHT): 2.42 cm     TR Peak grad:   53.9 mmHg MV Area VTI:   0.71 cm     TR Vmax:        367.00 cm/s MV Peak grad:  6.5 mmHg MV Mean grad:  2.0 mmHg     SHUNTS MV Vmax:       1.27 m/s     Systemic VTI:  0.14 m MV Vmean:      66.4 cm/s    Systemic Diam: 1.90 cm MV Decel Time: 313 msec MR PISA:        6.28 cm MR PISA Radius: 1.00 cm MV E  velocity: 143.00 cm/s MV A velocity: 38.10 cm/s MV E/A ratio:  3.75 Dietrich Pates MD Electronically signed by Dietrich Pates MD Signature Date/Time: 04/20/2023/3:41:15 PM    Final    DG Swallowing Func-Speech Pathology Result Date: 04/20/2023 Table formatting from the original result was not included. Modified Barium Swallow Study Patient Details Name: Frances Kent MRN: [id number] Date of Birth: 08/16/1930 Today's Date: 04/20/2023 HPI/PMH: HPI: Frances Kent is a 89 yo female presenting to ED 3/5 with acute onset of aphasia and L sided weakness. MRI - Acute infarcts in the right insula, right frontal lobe and the cerebellum bilaterally. Status post IR revascularization 3/5.  MBS 02/2021 - generally functional swallow with some oral holding but no aspiration, no residue. Pt previously seen by SLP in [facility] for cognitive impairments (problem solving, divergent naming, memory retrieval) s/p L basal ganglia CVA 2022. PMH includes A-fib, pacemaker, arthritis, recent COVID infection, GERD, HFrEF, HTN, macular degeneration, hypothyroidism, mitral regurgitation, PE (2019), prior CVA without residual deficits, SVT Clinical Impression: Clinical Impression: Pt presents with very functional swallowing marked only by occasional oral holding and portioning of bolus that she allows to move into the pharynx a bit at a time.  Laryngeal vestibule closure was reliable with no penetration nor aspiration. Pharyngeal stripping and base of tongue retraction were strong, leading to effective clearance of material through the pharynx. Pt declined to swallow the barium pill, stating that she breaks her pills up at baseline.  Recommend resuming a regular diet, thin liquids. She was told several years ago that she should not use straws - today she drank from a straw normally without any concerns.  Crush meds.Will need assist with feeding due to tremors. No further SLP f/u for swallowing is needed. Conveyed results to RN> Factors that may increase  risk of adverse event in presence of aspiration Frances Kent): No data recorded Recommendations/Plan: Swallowing Evaluation Recommendations Swallowing Evaluation Recommendations Recommendations: PO diet PO Diet Recommendation: Regular; Thin liquids (Level 0) Liquid Administration via: Cup; Straw Medication Administration: Crushed with puree Supervision: Patient able to self-feed; Staff to assist with self-feeding Oral care recommendations: Oral care BID (2x/day) Treatment Plan Treatment Plan Treatment recommendations: No treatment recommended at this time Follow-up recommendations: No SLP follow up Recommendations Recommendations for follow up therapy are one component of a multi-disciplinary discharge planning process, led by the attending physician.  Recommendations may be updated based on patient status, additional functional criteria and insurance authorization. Assessment: Orofacial Exam: Orofacial Exam Oral Cavity - Dentition: Missing dentition; Edentulous Orofacial Anatomy: WFL Anatomy: Anatomy: WFL Boluses Administered: Boluses Administered Boluses Administered: Thin liquids (Level 0);  Puree; Solid  Oral Impairment Domain: Oral Impairment Domain Lip Closure: No labial escape Tongue control during bolus hold: Cohesive bolus between tongue to palatal seal Bolus preparation/mastication: Slow prolonged chewing/mashing with complete recollection Bolus transport/lingual motion: Delayed initiation of tongue motion (oral holding) Oral residue: Complete oral clearance Initiation of pharyngeal swallow : Valleculae  Pharyngeal Impairment Domain: Pharyngeal Impairment Domain Soft palate elevation: No bolus between soft palate (SP)/pharyngeal wall (PW) Laryngeal elevation: Complete superior movement of thyroid cartilage with complete approximation of arytenoids to epiglottic petiole Anterior hyoid excursion: Complete anterior movement Epiglottic movement: Complete inversion Laryngeal vestibule closure:  Complete, no air/contrast in laryngeal vestibule Pharyngeal stripping wave : Present - complete Pharyngoesophageal segment opening: Partial distention/partial duration, partial obstruction of flow Tongue base retraction: No contrast between tongue base and posterior pharyngeal wall (PPW) Pharyngeal residue: Complete pharyngeal clearance  Esophageal Impairment Domain: No data recorded Pill: Pill Consistency administered: -- (pt declined, breaks pills into pieces at baseline) Penetration/Aspiration Scale Score: Penetration/Aspiration Scale Score 1.  Material does not enter airway: Thin liquids (Level 0); Puree; Solid Compensatory Strategies: Compensatory Strategies Compensatory strategies: Yes Straw: Effective   General Information: Caregiver present: No  Diet Prior to this Study: NPO   Temperature : Normal   Respiratory Status: WFL   Supplemental O2: None (Room air)   History of Recent Intubation: No  Behavior/Cognition: Alert; Cooperative; Pleasant mood Self-Feeding Abilities: Able to self-feed (tremor) Baseline vocal quality/speech: Normal Volitional Cough: Able to elicit Volitional Swallow: Able to elicit Exam Limitations: No limitations Goal Planning: Prognosis for improved oropharyngeal function: Good No data recorded No data recorded Patient/Family Stated Goal: nothing specific No data recorded Pain: Pain Assessment Pain Assessment: No/denies pain Facial Expression: 0 Body Movements: 0 Muscle Tension: 0 Compliance with ventilator (intubated pts.): 0 Vocalization (extubated pts.): N/A CPOT Total: 0 End of Session: Start Time:SLP Start Time (ACUTE ONLY): 1440 Stop Time: SLP Stop Time (ACUTE ONLY): 1456 Time Calculation:SLP Time Calculation (min) (ACUTE ONLY): 16 min Charges: SLP Evaluations $ SLP Speech Visit: 1 Visit SLP Evaluations $BSS Swallow: 1 Procedure $MBS Swallow: 1 Procedure $ SLP EVAL LANGUAGE/SOUND PRODUCTION: 1 Procedure SLP visit diagnosis: SLP Visit Diagnosis: Dysphagia, unspecified (R13.10) Past  Medical History: Past Medical History: Diagnosis Date  Arthritis   Atrial fibrillation (HCC)   Benign breast cyst in female, left 10/07/2016  Candida esophagitis (HCC) 05/03/2021  Found on Mar 10 2021 EGD   CAP (community acquired pneumonia) 02/15/2021  CHF (congestive heart failure) (HCC)   Colon adenomas   GERD (gastroesophageal reflux disease)   HCAP (healthcare-associated pneumonia) 02/02/2021  Hypertension   Hypothyroidism   Macular degeneration   Mitral regurgitation   Pulmonary embolism (HCC) 02/2016  Severe sepsis (HCC) 02/02/2021  Stroke (HCC) 10/2020  SVT (supraventricular tachycardia) (HCC)  Past Surgical History: Past Surgical History: Procedure Laterality Date  APPENDECTOMY  1960  BREAST CYST ASPIRATION Left 2017  CARDIOVERSION N/A 04/05/2016  Procedure: Cardioversion;  Surgeon: Lamar Blinks, MD;  Location: [facility] ORS;  Service: Cardiovascular;  Laterality: N/A;  CHOLECYSTECTOMY  1985  ESOPHAGOGASTRODUODENOSCOPY N/A 03/10/2021  Procedure: ESOPHAGOGASTRODUODENOSCOPY (EGD);  Surgeon: Toledo, Boykin Nearing, MD;  Location: [facility] ENDOSCOPY;  Service: Gastroenterology;  Laterality: N/A;  ESOPHAGOGASTRODUODENOSCOPY (EGD) WITH PROPOFOL N/A 10/13/[place]  Procedure: ESOPHAGOGASTRODUODENOSCOPY (EGD) WITH PROPOFOL;  Surgeon: Regis Bill, MD;  Location: [facility] ENDOSCOPY;  Service: Endoscopy;  Laterality: N/A;  ESOPHAGOGASTRODUODENOSCOPY (EGD) WITH PROPOFOL N/A 11/09/[place]  Procedure: ESOPHAGOGASTRODUODENOSCOPY (EGD) WITH PROPOFOL;  Surgeon: Regis Bill, MD;  Location: [facility] ENDOSCOPY;  Service: Endoscopy;  Laterality:  N/A;  IR CT HEAD LTD  11/01/2020  IR CT HEAD LTD  04/19/2023  IR PERCUTANEOUS ART THROMBECTOMY/INFUSION INTRACRANIAL INC DIAG ANGIO  11/01/2020  IR PERCUTANEOUS ART THROMBECTOMY/INFUSION INTRACRANIAL INC DIAG ANGIO  04/19/2023  IR US GUIDE VASC ACCESS RIGHT  11/02/2020  OVARIAN CYST REMOVAL    PACEMAKER LEADLESS INSERTION N/A 11/16/2021  Procedure: PACEMAKER LEADLESS INSERTION;  Surgeon: Marcina Millard, MD;  Location: [facility] INVASIVE CV LAB;  Service: Cardiovascular;  Laterality: N/A;  RADIOLOGY WITH ANESTHESIA N/A 11/01/2020  Procedure: RADIOLOGY WITH ANESTHESIA;  Surgeon: Julieanne Cotton, MD;  Location: [facility] OR;  Service: Radiology;  Laterality: N/A;  RADIOLOGY WITH ANESTHESIA N/A 04/19/2023  Procedure: RADIOLOGY WITH ANESTHESIA;  Surgeon: Radiologist, Medication, MD;  Location: [facility] OR;  Service: Radiology;  Laterality: N/A;  TEE WITHOUT CARDIOVERSION N/A 02/01/2018  Procedure: TRANSESOPHAGEAL ECHOCARDIOGRAM (TEE);  Surgeon: Lamar Blinks, MD;  Location: [facility] ORS;  Service: Cardiovascular;  Laterality: N/A; Jill Side. Samson Frederic, MA CCC/SLP Clinical Specialist - Acute Care SLP Acute Rehabilitation Services Office number [phone] Blenda Mounts Laurice 04/20/2023, 2:23 PM  IR PERCUTANEOUS ART THROMBECTOMY/INFUSION INTRACRANIAL INC DIAG ANGIO Result Date: 04/20/2023 INDICATION: New onset left-sided hemiplegia, and aphasia.  Right gaze deviation. Occluded right middle cerebral artery M1 segment on CT angiogram of the head and neck. EXAM: 1. EMERGENT LARGE VESSEL OCCLUSION THROMBOLYSIS (anterior CIRCULATION) COMPARISON:  CT angiogram of the head and neck April 19, 2023. MEDICATIONS: No antibiotic was administered within 1 hour of the procedure. ANESTHESIA/SEDATION: General anesthesia. CONTRAST:  Omnipaque 300 approximately 70 mL. FLUOROSCOPY TIME:  Fluoroscopy Time: 49 minutes 18 seconds (693 mGy). COMPLICATIONS: None immediate. TECHNIQUE: Following a full explanation of the procedure along with the potential associated complications, an informed witnessed consent was obtained. The risks of intracranial hemorrhage of 10%, worsening neurological deficit, ventilator dependency, death and inability to revascularize were all reviewed in detail with the patient's son. The patient was then put under general anesthesia by the Department of Anesthesiology at [facility]. The right groin was prepped and  draped in the usual sterile fashion. Thereafter using modified Seldinger technique, transfemoral access into the right common femoral artery was obtained without difficulty. Over an 0.035 inch guidewire an 8 French 25 cm Pinnacle sheath was inserted. Through this, and also over an 0.035 inch guidewire a combination of an 088 100 cm Zoom support catheter with a 120 cm 6 [place] VDK catheter was advanced to the aortic arch region, and selectively positioned in the right common carotid artery. The support catheter and the guidewire were removed. Good aspiration was obtained from the hub of the 088 Zoom support catheter just inside the right internal carotid artery. A control arteriogram was then performed centered extra cranially and intracranially. FINDINGS: The right external carotid artery and its major branches demonstrate wide patency. The right internal carotid artery at the bulb in its proximal 1/3 demonstrates wide patency. The mid 1/3 of the right ICA demonstrates focal areas of mild smooth irregularity associated with mild intraluminal narrowing most consistent with moderate FMD. Distal to this, right ICA is widely patent in the petrous segment. Slow ascent of contrast is seen into the supraclinoid right ICA with traces of contrast noted in the right middle cerebral artery proximally. Also noted is flow in the right anterior cerebral A1 segment. PROCEDURE: Through the 088 Zoom aspiration catheter now in the distal 1/3 of the right ICA, a combination of an 062 132 cm Zoom aspiration catheter with a 150 cm Phenom microcatheter was advanced over an  018 inch micro guidewire with a moderate J configuration to the supraclinoid right ICA. The micro guidewire was then gently manipulated using a torque device and advanced through the occluded right middle cerebral artery into the inferior branch of the right middle cerebral artery M2 segment followed by the microcatheter and the 062 Zoom catheter which was advanced  into the occluded distal M1 segment. The microcatheter and the micro guidewire were removed as constant aspiration was applied at the hub of the 062 aspiration catheter using a pump for 2 minutes. Thereafter, the 062 aspiration catheter was retrieved and removed. A control arteriogram performed through the 088 Zoom catheter now in the distal petrous segment demonstrated complete opacification of the right anterior cerebral artery achieving a TICI 3 revascularization. The right middle cerebral artery demonstrated opacification of the superior division achieving a TICI 2B revascularization. A second pass was then made again using the above combination this time the micro guidewire and the microcatheter combination advanced into the M2 M3 segment of the inferior division where, the micro guidewire was removed. Good aspiration obtained from the hub of the microcatheter. A gentle contrast injection demonstrated safe positioning of the tip of the microcatheter. This was then connected to continuous saline infusion. A 4 mm x 40 mm Solitaire X retrieval device was then deployed in the usual manner. The 062 aspiration catheter was then advanced into the proximal inferior division. Constant aspiration of the 062 aspiration catheter with a pump and aspiration at the hub of the 088 guide catheter was then performed for 2 minutes. Thereafter, the retrieval device, the microcatheter and the 062 aspiration catheter was retrieved and removed. A control arteriogram performed through the support catheter in the right internal carotid artery now demonstrated revascularization of the right MCA distribution. Patency was intact in the right anterior cerebral artery distribution except for an occluded mid A3 segment of the right anterior cerebral artery. A combination of an 062 aspiration catheter with a 0.01 50 cm microcatheter was then advanced over an 018 inch Aristotle micro guidewire to the right M1 segment and then into the right  anterior cerebral artery distal A3 segment followed the microcatheter. The guidewire was removed. Good aspiration obtained from the hub of the microcatheter. Aspiration was then applied at the hub of the microcatheter as it was retrieved proximally simultaneously through the 062 aspiration catheter in the proximal A2 segment. The combination was retrieved and removed. A control arteriogram performed through the Zoom 088 in the right internal carotid artery demonstrated revascularization of the distal right anterior cerebral A3 A4 region albeit slow flow. Flow remained sustained distal to this on subsequent control arteriograms. A final control arteriogram performed through the Zoom 088 aspiration catheter in the distal right common carotid artery demonstrated no change in the moderate FMD-like changes in the mid cervical right ICA. More distally, the right middle cerebral artery distribution continues to maintain a TICI 3 revascularization. The Zoom 088 support catheter was removed. An 8 French Angio-Seal closure device was deployed at the right groin puncture site. Distal pulses remained present bilaterally unchanged from prior to the procedure. A flat panel CT of the brain demonstrated no evidence of hemorrhagic complications. The patient was left intubated due to her medical condition as per anesthesia. She was then transferred to neuro ICU for post revascularization care. IMPRESSION: Status post revascularization of occluded right middle cerebral artery M1 segment in the inferior divisions of the right middle cerebral artery with 1 pass with contact aspiration, and 1 pass  with contact aspiration and a 4 mm x 40 mm Solitaire X retrieval device achieving a TICI 3 revascularization. Status post aspiration of proximal A3 segment of the right anterior cerebral artery with 1 pass with contact aspiration achieving a TICI 2C revascularization. PLAN: Per referring MD. Electronically Signed   By: Julieanne Cotton M.D.    On: 04/20/2023 08:27   IR CT Head Ltd Result Date: 04/20/2023 INDICATION: New onset left-sided hemiplegia, and aphasia.  Right gaze deviation. Occluded right middle cerebral artery M1 segment on CT angiogram of the head and neck. EXAM: 1. EMERGENT LARGE VESSEL OCCLUSION THROMBOLYSIS (anterior CIRCULATION) COMPARISON:  CT angiogram of the head and neck April 19, 2023. MEDICATIONS: No antibiotic was administered within 1 hour of the procedure. ANESTHESIA/SEDATION: General anesthesia. CONTRAST:  Omnipaque 300 approximately 70 mL. FLUOROSCOPY TIME:  Fluoroscopy Time: 49 minutes 18 seconds (693 mGy). COMPLICATIONS: None immediate. TECHNIQUE: Following a full explanation of the procedure along with the potential associated complications, an informed witnessed consent was obtained. The risks of intracranial hemorrhage of 10%, worsening neurological deficit, ventilator dependency, death and inability to revascularize were all reviewed in detail with the patient's son. The patient was then put under general anesthesia by the Department of Anesthesiology at [facility]. The right groin was prepped and draped in the usual sterile fashion. Thereafter using modified Seldinger technique, transfemoral access into the right common femoral artery was obtained without difficulty. Over an 0.035 inch guidewire an 8 French 25 cm Pinnacle sheath was inserted. Through this, and also over an 0.035 inch guidewire a combination of an 088 100 cm Zoom support catheter with a 120 cm 6 [place] VDK catheter was advanced to the aortic arch region, and selectively positioned in the right common carotid artery. The support catheter and the guidewire were removed. Good aspiration was obtained from the hub of the 088 Zoom support catheter just inside the right internal carotid artery. A control arteriogram was then performed centered extra cranially and intracranially. FINDINGS: The right external carotid artery and its major branches  demonstrate wide patency. The right internal carotid artery at the bulb in its proximal 1/3 demonstrates wide patency. The mid 1/3 of the right ICA demonstrates focal areas of mild smooth irregularity associated with mild intraluminal narrowing most consistent with moderate FMD. Distal to this, right ICA is widely patent in the petrous segment. Slow ascent of contrast is seen into the supraclinoid right ICA with traces of contrast noted in the right middle cerebral artery proximally. Also noted is flow in the right anterior cerebral A1 segment. PROCEDURE: Through the 088 Zoom aspiration catheter now in the distal 1/3 of the right ICA, a combination of an 062 132 cm Zoom aspiration catheter with a 150 cm Phenom microcatheter was advanced over an 018 inch micro guidewire with a moderate J configuration to the supraclinoid right ICA. The micro guidewire was then gently manipulated using a torque device and advanced through the occluded right middle cerebral artery into the inferior branch of the right middle cerebral artery M2 segment followed by the microcatheter and the 062 Zoom catheter which was advanced into the occluded distal M1 segment. The microcatheter and the micro guidewire were removed as constant aspiration was applied at the hub of the 062 aspiration catheter using a pump for 2 minutes. Thereafter, the 062 aspiration catheter was retrieved and removed. A control arteriogram performed through the 088 Zoom catheter now in the distal petrous segment demonstrated complete opacification of the right anterior cerebral artery  achieving a TICI 3 revascularization. The right middle cerebral artery demonstrated opacification of the superior division achieving a TICI 2B revascularization. A second pass was then made again using the above combination this time the micro guidewire and the microcatheter combination advanced into the M2 M3 segment of the inferior division where, the micro guidewire was removed. Good  aspiration obtained from the hub of the microcatheter. A gentle contrast injection demonstrated safe positioning of the tip of the microcatheter. This was then connected to continuous saline infusion. A 4 mm x 40 mm Solitaire X retrieval device was then deployed in the usual manner. The 062 aspiration catheter was then advanced into the proximal inferior division. Constant aspiration of the 062 aspiration catheter with a pump and aspiration at the hub of the 088 guide catheter was then performed for 2 minutes. Thereafter, the retrieval device, the microcatheter and the 062 aspiration catheter was retrieved and removed. A control arteriogram performed through the support catheter in the right internal carotid artery now demonstrated revascularization of the right MCA distribution. Patency was intact in the right anterior cerebral artery distribution except for an occluded mid A3 segment of the right anterior cerebral artery. A combination of an 062 aspiration catheter with a 0.01 50 cm microcatheter was then advanced over an 018 inch Aristotle micro guidewire to the right M1 segment and then into the right anterior cerebral artery distal A3 segment followed the microcatheter. The guidewire was removed. Good aspiration obtained from the hub of the microcatheter. Aspiration was then applied at the hub of the microcatheter as it was retrieved proximally simultaneously through the 062 aspiration catheter in the proximal A2 segment. The combination was retrieved and removed. A control arteriogram performed through the Zoom 088 in the right internal carotid artery demonstrated revascularization of the distal right anterior cerebral A3 A4 region albeit slow flow. Flow remained sustained distal to this on subsequent control arteriograms. A final control arteriogram performed through the Zoom 088 aspiration catheter in the distal right common carotid artery demonstrated no change in the moderate FMD-like changes in the mid  cervical right ICA. More distally, the right middle cerebral artery distribution continues to maintain a TICI 3 revascularization. The Zoom 088 support catheter was removed. An 8 French Angio-Seal closure device was deployed at the right groin puncture site. Distal pulses remained present bilaterally unchanged from prior to the procedure. A flat panel CT of the brain demonstrated no evidence of hemorrhagic complications. The patient was left intubated due to her medical condition as per anesthesia. She was then transferred to neuro ICU for post revascularization care. IMPRESSION: Status post revascularization of occluded right middle cerebral artery M1 segment in the inferior divisions of the right middle cerebral artery with 1 pass with contact aspiration, and 1 pass with contact aspiration and a 4 mm x 40 mm Solitaire X retrieval device achieving a TICI 3 revascularization. Status post aspiration of proximal A3 segment of the right anterior cerebral artery with 1 pass with contact aspiration achieving a TICI 2C revascularization. PLAN: Per referring MD. Electronically Signed   By: Julieanne Cotton M.D.   On: 04/20/2023 08:27   MR BRAIN WO CONTRAST Result Date: 04/19/2023 CLINICAL DATA:  Stroke, follow up EXAM: MRI HEAD WITHOUT CONTRAST MRA HEAD WITHOUT CONTRAST TECHNIQUE: Multiplanar, multi-echo pulse sequences of the brain and surrounding structures were acquired without intravenous contrast. Angiographic images of the Circle of Willis were acquired using MRA technique without intravenous contrast. COMPARISON:  None Available. FINDINGS: MRI HEAD FINDINGS  Brain: Mild restricted diffusion the right insula. A few small foci of acute infarct in the overlying right frontal cortex and white matter. A few punctate acute infarcts in the cerebellum bilaterally. No substantial mass effect. No midline shift. No acute hemorrhage. No hydrocephalus. Cerebral atrophy. Vascular: See below. Skull and upper cervical spine:  Normal marrow signal. Sinuses/Orbits: Clear sinuses.  No acute orbital findings. MRA HEAD FINDINGS Anterior circulation: Bilateral intracranial ICAs, MCAs and ACAs are patent without proximal hemodynamically significant stenosis. Right M1 MCA appears patent. Posterior circulation: Bilateral intradural vertebral arteries, basilar artery and bilateral posterior cerebral arteries are patent. IMPRESSION: 1. Acute infarcts in the right insula, right frontal lobe and the cerebellum bilaterally. 2. No large vessel occlusion.  Right M1 MCA is now patent. Electronically Signed   By: Feliberto Harts M.D.   On: 04/19/2023 21:32   MR ANGIO HEAD WO CONTRAST Result Date: 04/19/2023 CLINICAL DATA:  Stroke, follow up EXAM: MRI HEAD WITHOUT CONTRAST MRA HEAD WITHOUT CONTRAST TECHNIQUE: Multiplanar, multi-echo pulse sequences of the brain and surrounding structures were acquired without intravenous contrast. Angiographic images of the Circle of Willis were acquired using MRA technique without intravenous contrast. COMPARISON:  None Available. FINDINGS: MRI HEAD FINDINGS Brain: Mild restricted diffusion the right insula. A few small foci of acute infarct in the overlying right frontal cortex and white matter. A few punctate acute infarcts in the cerebellum bilaterally. No substantial mass effect. No midline shift. No acute hemorrhage. No hydrocephalus. Cerebral atrophy. Vascular: See below. Skull and upper cervical spine: Normal marrow signal. Sinuses/Orbits: Clear sinuses.  No acute orbital findings. MRA HEAD FINDINGS Anterior circulation: Bilateral intracranial ICAs, MCAs and ACAs are patent without proximal hemodynamically significant stenosis. Right M1 MCA appears patent. Posterior circulation: Bilateral intradural vertebral arteries, basilar artery and bilateral posterior cerebral arteries are patent. IMPRESSION: 1. Acute infarcts in the right insula, right frontal lobe and the cerebellum bilaterally. 2. No large vessel  occlusion.  Right M1 MCA is now patent. Electronically Signed   By: Feliberto Harts M.D.   On: 04/19/2023 21:32   DG Abd 1 View Result Date: 04/19/2023 CLINICAL DATA:  Orogastric tube placement. EXAM: ABDOMEN - 1 VIEW COMPARISON:  None Available. FINDINGS: Distal tip of nasogastric tube is seen in expected position of stomach. No abnormal bowel dilatation. Possible gastrostomy tube is noted. IMPRESSION: Distal tip of nasogastric tube is seen in expected position of stomach. Electronically Signed   By: Lupita Raider M.D.   On: 04/19/2023 08:38   Portable Chest x-ray Result Date: 04/19/2023 CLINICAL DATA:  Endotracheal tube. EXAM: PORTABLE CHEST 1 VIEW COMPARISON:  April 02, 2023. FINDINGS: Stable cardiomegaly. Endotracheal and nasogastric tubes are in grossly good position. Minimal right basilar subsegmental atelectasis is noted. Mild left retrocardiac opacity is noted concerning for atelectasis or infiltrate with probable small pleural effusion. Bony thorax is unremarkable. IMPRESSION: Support apparatus as noted above. Left retrocardiac opacity as noted above. Minimal right basilar atelectasis. Electronically Signed   By: Lupita Raider M.D.   On: 04/19/2023 08:36   CT ANGIO HEAD NECK W WO CM (CODE STROKE) Result Date: 04/19/2023 EXAM: CT ANGIOGRAPHY HEAD AND NECK WITH AND WITHOUT CONTRAST TECHNIQUE: Multidetector CT imaging of the head and neck was performed using the standard protocol during bolus administration of intravenous contrast. Multiplanar CT image reconstructions and MIPs were obtained to evaluate the vascular anatomy. Carotid stenosis measurements (when applicable) are obtained utilizing NASCET criteria, using the distal internal carotid diameter as the denominator. RADIATION DOSE  REDUCTION: This exam was performed according to the departmental dose-optimization program which includes automated exposure control, adjustment of the mA and/or kV according to patient size and/or use of  iterative reconstruction technique. CONTRAST:  75mL OMNIPAQUE IOHEXOL 350 MG/ML SOLN COMPARISON:  CT head from today. FINDINGS: CTA NECK FINDINGS Aortic arch: Great vessel origins are patent without significant stenosis. Right carotid system: No evidence of dissection, stenosis (50% or greater), or occlusion. Mildly diminished opacification the right ICA in the neck likely relates to the intracranial occlusion described below. Left carotid system: No evidence of dissection, stenosis (50% or greater), or occlusion. Vertebral arteries: Codominant. No evidence of dissection, stenosis (50% or greater), or occlusion. Skeleton: No acute abnormality on limited assessment. Other neck: No acute abnormality on limited assessment. Upper chest: Visualized lung apices are clear. Review of the MIP images confirms the above findings CTA HEAD FINDINGS Anterior circulation: Bilateral intracranial ICAs are patent without significant stenosis. Occlusion versus critical stenosis of the right M1 MCA with asymmetrically diminished opacification of more distal right MCA vessels. Suspected thrombus is visualized within the M1 MCA and extends into proximal M2 MCA branches. Left MCAs and bilateral ACAs are patent. Posterior circulation: Bilateral intradural vertebral arteries, basilar artery and bilateral posterior arteries are patent without proximal in a mic least significant stenosis. Venous sinuses: Not well assessed due to arterial timing. Review of the MIP images confirms the above findings IMPRESSION: Occlusion versus critical stenosis of the right M1 MCA with asymmetrically diminished opacification of more distal right MCA vessels. Suspected thrombus is visualized within the M1 MCA and extends into proximal M2 MCA branches. Findings discussed with Dr. Otelia Limes via telephone at 2:26 a.m. Electronically Signed   By: Feliberto Harts M.D.   On: 04/19/2023 02:37   CT HEAD CODE STROKE WO CONTRAST Result Date: 04/19/2023 CLINICAL DATA:   Code stroke.  Neuro deficit, acute, stroke suspected EXAM: CT HEAD WITHOUT CONTRAST TECHNIQUE: Contiguous axial images were obtained from the base of the skull through the vertex without intravenous contrast. RADIATION DOSE REDUCTION: This exam was performed according to the departmental dose-optimization program which includes automated exposure control, adjustment of the mA and/or kV according to patient size and/or use of iterative reconstruction technique. COMPARISON:  CT March 23, 2023. FINDINGS: Brain: No evidence of acute large vascular territory infarction, hemorrhage, hydrocephalus, extra-axial collection or mass lesion/mass effect. Vascular: No hyperdense vessel. Skull: No acute fracture. Sinuses/Orbits: Left sphenoid sinus frothy secretions. No acute orbital findings. Other: No mastoid effusions. ASPECTS [facility] Stroke Program Early CT Score) Total score (0-10 with 10 being normal): 10. IMPRESSION: No evidence of acute intracranial abnormality. Code stroke imaging results were communicated on 04/19/2023 at 2:18 am to provider Dr. Otelia Limes via secure text paging. Electronically Signed   By: Feliberto Harts M.D.   On: 04/19/2023 02:18   CT CHEST WO CONTRAST Result Date: 04/03/2023 CLINICAL DATA:  Chronic dyspnea with unclear etiology EXAM: CT CHEST WITHOUT CONTRAST TECHNIQUE: Multidetector CT imaging of the chest was performed following the standard protocol without IV contrast. RADIATION DOSE REDUCTION: This exam was performed according to the departmental dose-optimization program which includes automated exposure control, adjustment of the mA and/or kV according to patient size and/or use of iterative reconstruction technique. COMPARISON:  Radiograph from yesterday FINDINGS: Cardiovascular: Cardiac enlargement. Small low-density pericardial effusion measuring up to 9 mm in thickness posteriorly. There is atheromatous calcification. Prominent mitral annular calcification. Lead less pacer at the right  ventricular apex. Mediastinum/Nodes: No worrisome lymph node enlargement, mass, or  esophageal thickening. Lungs/Pleura: Biapical pleural based scarring. Central airways are clear. Minor dependent atelectasis. There is no consolidation, effusion, or pneumothorax. Slight interlobular septal thickening best seen on coronal reformats. Upper Abdomen: Cholecystectomy clips and atheromatous calcification. Musculoskeletal: T11 compression fracture with moderate height loss, nonacute and known from 2023 MRI. IMPRESSION: 1. Slight thickening of the interstitium may be minimal pulmonary edema. There is chronic cardiomegaly with small pericardial effusion. 2. Negative for pneumonia. Electronically Signed   By: Tiburcio Pea M.D.   On: 04/03/2023 07:28   DG Chest 2 View Result Date: 04/02/2023 CLINICAL DATA:  Dyspnea, atrial fibrillation EXAM: CHEST - 2 VIEW COMPARISON:  03/23/2023 FINDINGS: The lungs are symmetrically well expanded. Small left pleural effusion is present. Chronic interstitial thickening again noted though superimposed interstitial pulmonary edema has resolved. No pneumothorax. Stable cardiomegaly. Leadless pacemaker again noted. No acute bone abnormality. IMPRESSION: 1. Small left pleural effusion. 2. Cardiomegaly. 3. Resolved pulmonary edema Electronically Signed   By: Helyn Numbers M.D.   On: 04/02/2023 19:41    Labs:  Basic Metabolic Panel: Recent Labs  Lab 04/19/23 0606 04/20/23 0604 04/21/23 0712 04/24/23 1431  NA 137 139 140 137  K 3.5 5.7* 4.5 4.5  CL  --  111 109 104  CO2  --  24 24 25   GLUCOSE  --  99 86 90  BUN  --  16 21 10   CREATININE  --  1.39* 1.38* 1.14*  CALCIUM  --  8.7* 8.6* 9.0  MG  --  2.2  --   --     CBC: Recent Labs  Lab 04/20/23 0604 04/21/23 0712 04/24/23 0531  WBC 7.8 6.9 6.9  NEUTROABS  --   --  4.6  HGB 12.0 11.0* 11.3*  HCT 38.4 34.8* 34.8*  MCV 91.2 90.4 89.5  PLT 216 167 175    CBG: No results for input(s): "GLUCAP" in the last 168  hours.  Family history.  Mother with breast cancer son with pancreatic cancer denies any colon cancer esophageal cancer or rectal cancer  Brief HPI:   Frances Kent is a 89 y.o. right-handed female with history significant for atrial fibrillation/SVT on Xarelto/pacemaker, recent COVID infection, CKD stage III, diastolic congestive heart failure, hypertension, hypothyroidism, macular degeneration, mitral regurgitation, pulmonary emboli 2019 as well as prior CVA 9/22 without residual weakness.  Per chart review patient lives alone.  1 level home.  Ambulatory with a rolling walker.  Good family support.  Presented 04/19/2023 with acute onset of aphasia as well as left-sided weakness.  CT scan negative.  CTA showed occlusion versus critical stenosis of the right M1 MCA with asymmetrical diminished opacification of more distal right MCA vessels.  Suspected thrombus visualized within the M1 MCA and extends into proximal M2 MCA branches.  MRI showed acute infarct right insula, right frontal lobe and the cerebellum bilaterally.  MRA showed no large vessel occlusion.  Patient did not receive tPA.  Admission chemistries unremarkable except creatinine 1.33.  Echocardiogram with ejection fraction of 30 to 35%.  Left ventricle moderately decreased function.  Patient did undergo revascularization and thrombectomy per interventional radiology.  She was slowly extubated.  Initially on aspirin switched to Eliquis.  Tolerating a regular diet.  Therapy evaluations completed due to patient decreased functional mobility was admitted for a comprehensive rehab program.   Hospital Course: Frances Kent was admitted to rehab 04/21/2023 for inpatient therapies to consist of PT, ST and OT at least three hours five days a week. Past  admission physiatrist, therapy team and rehab RN have worked together to provide customized collaborative inpatient rehab.  Pertaining to patient's right MCA and cerebellar infarct right M1 and ACA  occlusion status post IR thrombectomy with revascularization.  Patient would follow-up with neurology as well as interventional radiology.  Maintained on Eliquis after CVA as well as thrombectomy no bleeding episodes.  Zetia ongoing for hyperlipidemia.  Hormone supplement with hypothyroidism as indicated.  CKD stage III stable follow-up chemistries.  History of atrial fibrillation with SVT and pacemaker cardiac rate controlled follow-up cardiology services and maintained on low-dose beta-blocker as well as Eliquis.  Recent COVID infection remaining afebrile.  Diastolic congestive heart failure exhibited no signs of fluid overload and remained on low-dose diuresis.   Blood pressures were monitored on TID basis and monitored and controlled     Rehab course: During patient's stay in rehab weekly team conferences were held to monitor patient's progress, set goals and discuss barriers to discharge. At admission, patient required min mod assist 7 feet rolling walker minimal assist sit to stand  Physical exam.  Blood pressure 129/76 pulse 65 temperature 98.3 respiration 17 oxygen saturations 93% room air Constitutional.  No acute distress HEENT Head.  Normocephalic and atraumatic Eyes.  Pupils round and reactive to light no discharge without nystagmus Neck.  Supple nontender no JVD without thyromegaly Cardiac regular rate and rhythm without any extra sounds or murmur heard Abdomen.  Soft nontender positive bowel sounds without rebound Respiratory effort normal no respiratory distress without wheeze Skin.  C/D/I.  Slight MASD on coccyx Neurologic.  Alert and oriented x 3 Manual muscle testing Right upper extremity 5/5 except 4/5W EE, 4/5 FF, 4/5 FA Left upper extremity 5/5 Right lower extremity 5/5 except 4/5 KE 4/5 DF 4/5 PF Left lower extremity 5/5    Media Information   Document Information  Photos  Coccyx  04/21/2023 17:54  Attached To:  Hospital Encounter on 04/21/23  Source  Information  Tennis Must, RN  [facility]  Document History    He/She  has had improvement in activity tolerance, balance, postural control as well as ability to compensate for deficits. He/She has had improvement in functional use RUE/LUE  and RLE/LLE as well as improvement in awareness.  Working with energy conservation techniques.  Ambulates 180 feet with rolling walker and verbal cues in a home environment simulation.  Participated in curb navigation rolling walker verbal cues.  Supervision lower body bathing supervision upper body bathing supervision lower body dressing supervision upper body dressing.  Patient able to verbalize hospital stay.  Patient challenged in medication management task.  Given scenarios presently verbally she identified solutions to medication problems 100% accuracy.  Full family teaching completed plan discharge to home       Disposition:  Discharge disposition: 06-Home-Health Care Svc        Diet: Regular  Special Instructions: No driving smoking or alcohol  Medications at discharge. 1.  Tylenol as needed 2.  Eliquis 2.5 mg p.o. twice daily 3.  Colace 100 mg p.o. twice daily 4.  Zetia 10 mg p.o. daily 5.  Lasix 10 mg p.o. daily 6.  Synthroid 50 mcg p.o. daily 7.  Protonix 40 mg p.o. daily 8.  MiraLAX daily hold for loose stools 9.  Gerhardt Butt cream twice daily 10.  Lopressor 12.5 mg p.o. twice daily 11.  Senokot S1 tablet at bedtime as needed   30-35 minutes were spent completing discharge summary and discharge planning  Discharge  Instructions     Ambulatory referral to Neurology   Complete by: As directed    An appointment is requested in approximately: 4 weeks right MCA infarction   Ambulatory referral to Physical Medicine Rehab   Complete by: As directed    Moderate complexity follow-up 1 to 2 weeks right MCA infarction        Follow-up Information     Kirsteins, Victorino Sparrow, MD Follow up.   Specialty: Physical  Medicine and Rehabilitation Why: Office to call for appointment Contact information: [address] [place] [address] [phone]         Marcina Millard, MD Follow up.   Specialty: Cardiology Why: Call for appointment Contact information: [address] [facility] [place] [address] [phone]         Julieanne Cotton, MD Follow up.   Specialties: Interventional Radiology, Radiology Why: Call for appointment Contact information: [address] [place] [address] [phone]                 Signed: Charlton Amor 04/26/2023, 5:03 AM

## 2023-04-22 NOTE — Evaluation (Signed)
 Physical Therapy Assessment and Plan  Patient Details  Name: Frances Kent MRN: [id number] Date of Birth: January 31, 1931  PT Diagnosis: Difficulty walking and Muscle weakness Rehab Potential: Good ELOS: 7-10 Days   Today's Date: 04/22/2023 PT Individual Time: 1045-1200 PT Individual Time Calculation (min): 75 min    Hospital Problem: Principal Problem:   Right middle cerebral artery stroke [facility])   Past Medical History:  Past Medical History:  Diagnosis Date   Arthritis    Atrial fibrillation (HCC)    Benign breast cyst in female, left 10/07/2016   Candida esophagitis (HCC) 05/03/2021   Found on Mar 10 2021 EGD    CAP (community acquired pneumonia) 02/15/2021   CHF (congestive heart failure) (HCC)    Colon adenomas    GERD (gastroesophageal reflux disease)    HCAP (healthcare-associated pneumonia) 02/02/2021   Hypertension    Hypothyroidism    Macular degeneration    Mitral regurgitation    Pulmonary embolism (HCC) 02/2016   Severe sepsis (HCC) 02/02/2021   Stroke (HCC) 10/2020   SVT (supraventricular tachycardia) (HCC)    Past Surgical History:  Past Surgical History:  Procedure Laterality Date   APPENDECTOMY  1960   BREAST CYST ASPIRATION Left 2017   CARDIOVERSION N/A 04/05/2016   Procedure: Cardioversion;  Surgeon: Lamar Blinks, MD;  Location: [facility] ORS;  Service: Cardiovascular;  Laterality: N/A;   CHOLECYSTECTOMY  1985   ESOPHAGOGASTRODUODENOSCOPY N/A 03/10/2021   Procedure: ESOPHAGOGASTRODUODENOSCOPY (EGD);  Surgeon: Toledo, Boykin Nearing, MD;  Location: [facility] ENDOSCOPY;  Service: Gastroenterology;  Laterality: N/A;   ESOPHAGOGASTRODUODENOSCOPY (EGD) WITH PROPOFOL N/A 11/27/2019   Procedure: ESOPHAGOGASTRODUODENOSCOPY (EGD) WITH PROPOFOL;  Surgeon: Regis Bill, MD;  Location: [facility] ENDOSCOPY;  Service: Endoscopy;  Laterality: N/A;   ESOPHAGOGASTRODUODENOSCOPY (EGD) WITH PROPOFOL N/A 12/24/2019   Procedure: ESOPHAGOGASTRODUODENOSCOPY (EGD) WITH PROPOFOL;   Surgeon: Regis Bill, MD;  Location: [facility] ENDOSCOPY;  Service: Endoscopy;  Laterality: N/A;   IR CT HEAD LTD  11/01/2020   IR CT HEAD LTD  04/19/2023   IR PERCUTANEOUS ART THROMBECTOMY/INFUSION INTRACRANIAL INC DIAG ANGIO  11/01/2020   IR PERCUTANEOUS ART THROMBECTOMY/INFUSION INTRACRANIAL INC DIAG ANGIO  04/19/2023   IR US GUIDE VASC ACCESS RIGHT  11/02/2020   OVARIAN CYST REMOVAL     PACEMAKER LEADLESS INSERTION N/A 11/16/2021   Procedure: PACEMAKER LEADLESS INSERTION;  Surgeon: Marcina Millard, MD;  Location: [facility] INVASIVE [facility] LAB;  Service: Cardiovascular;  Laterality: N/A;   RADIOLOGY WITH ANESTHESIA N/A 11/01/2020   Procedure: RADIOLOGY WITH ANESTHESIA;  Surgeon: Julieanne Cotton, MD;  Location: [facility] OR;  Service: Radiology;  Laterality: N/A;   RADIOLOGY WITH ANESTHESIA N/A 04/19/2023   Procedure: RADIOLOGY WITH ANESTHESIA;  Surgeon: Radiologist, Medication, MD;  Location: [facility] OR;  Service: Radiology;  Laterality: N/A;   TEE WITHOUT CARDIOVERSION N/A 02/01/2018   Procedure: TRANSESOPHAGEAL ECHOCARDIOGRAM (TEE);  Surgeon: Lamar Blinks, MD;  Location: [facility] ORS;  Service: Cardiovascular;  Laterality: N/A;    Assessment & Plan Clinical Impression: Patient is a 89 year old right-handed female fibrillation/SVT on Xarelto/pacemaker, recent COVID infection, CKD stage III diastolic congestive heart failure, hypertension, hypothyroidism, macular degeneration, mitral regurgitation, PE 2019 as well as prior CVA 9/22 without significant residual deficits. Per chart review patient lives alone. 1 level home with one-step to entry. Ambulatory with a rolling walker. Good support of local family. Presented 04/19/2023 with acute onset of aphasia as well as left-sided weakness. CT scan negative. CT a showed occlusion versus critical stenosis of the right M1 MCA with asymmetrical diminished opacification  of more distal right MCA vessels. Suspected thrombus visualized within the M1 MCA and extends into  proximal M2 MCA branches. MRI showed acute infarct right insula, right frontal lobe and the cerebellum bilateral. MRA showed no large vessel occlusion. Patient did not receive tPA. Admission chemistries unremarkable except creatinine 1.33. Echocardiogram with ejection fraction of 30 to 35%. Left ventricle moderately decreased function. Patient did undergo revascularization/thrombectomy per interventional radiology. She was slowly extubated. Initially on aspirin suppository switched to Eliquis. She is tolerating a regular diet.   Patient transferred to CIR on 04/21/2023 .   Patient currently requires min with mobility secondary to muscle weakness, decreased cardiorespiratoy endurance, and decreased sitting balance, decreased standing balance, decreased postural control, and decreased balance strategies.  Prior to hospitalization, patient was modified independent  with mobility and lived with Alone in a House home.  Home access is  Level entry.  Patient will benefit from skilled PT intervention to maximize safe functional mobility, minimize fall risk, and decrease caregiver burden for planned discharge home with 24 hour supervision.  Anticipate patient will benefit from follow up HH at discharge.  PT - End of Session Activity Tolerance: Tolerates 30+ min activity with multiple rests Endurance Deficit: Yes PT Assessment Rehab Potential (ACUTE/IP ONLY): Good PT Patient demonstrates impairments in the following area(s): Balance;Endurance;Motor;Safety PT Transfers Functional Problem(s): Bed Mobility;Bed to Chair;Car;Furniture PT Locomotion Functional Problem(s): Ambulation;Stairs PT Plan PT Intensity: Minimum of 1-2 x/day ,45 to 90 minutes PT Frequency: 5 out of 7 days PT Duration Estimated Length of Stay: 7-10 Days PT Treatment/Interventions: Ambulation/gait training;Community reintegration;DME/adaptive equipment instruction;Neuromuscular re-education;Psychosocial support;Stair training;UE/LE Strength  taining/ROM;Balance/vestibular training;Discharge planning;Pain management;Skin care/wound management;Therapeutic Activities;UE/LE Coordination activities;Cognitive remediation/compensation;Disease management/prevention;Functional mobility training;Patient/family education;Therapeutic Exercise;Splinting/orthotics PT Transfers Anticipated Outcome(s): Supervision PT Locomotion Anticipated Outcome(s): Supervision PT Recommendation Recommendations for Other Services: Therapeutic Recreation consult Therapeutic Recreation Interventions: Other (comment) (dance) Follow Up Recommendations: 24 hour supervision/assistance Patient destination: Home Equipment Recommended: To be determined   PT Evaluation Precautions/Restrictions Precautions Precautions: Fall Restrictions Weight Bearing Restrictions Per Provider Order: No General Chart Reviewed: Yes Family/Caregiver Present: No  Pain Pain Assessment Pain Score: 0-No pain Pain Interference Pain Interference Pain Effect on Sleep: 1. Rarely or not at all Pain Interference with Therapy Activities: 1. Rarely or not at all Pain Interference with Day-to-Day Activities: 1. Rarely or not at all Home Living/Prior Functioning Home Living Available Help at Discharge: Family Type of Home: House Home Access: Level entry Home Layout: One level;Laundry or work area in basement [employer]: Health visitor: Standard Additional Comments: pt lives "alone" but family there frequently.   Pt only moves from lift chair to her bed or to the bathroom by herself.  Prior to covid, pt showered and dressed herself.  Family states they will provide 24/7 supervision.  Has a shower seat and BSC over toilet.  Lives With: Alone Prior Function Level of Independence: Independent with basic ADLs;Independent with gait;Independent with transfers;Requires assistive device for independence;Other (comment) (used RW) Driving: No Vocation:  Retired Optometrist - History Ability to See in Adequate Light: 0 Adequate Vision - Assessment Additional Comments: pt reports no change in vision with this CVA, but has decreased acuity in L eye (PLOF) Perception Perception: Within Functional Limits Praxis Praxis: WFL  Cognition Overall Cognitive Status: Within Functional Limits for tasks assessed Memory: Appears intact Awareness: Appears intact Safety/Judgment: Appears intact Sensation Sensation Light Touch: Appears Intact Hot/Cold: Appears Intact Proprioception: Appears Intact Stereognosis: Not tested Coordination Gross Motor Movements are Fluid and Coordinated: No  Fine Motor Movements are Fluid and Coordinated: No Coordination and Movement Description: decreased on L due to new onset of weakness,  R resting tremor Finger Nose Finger Test: accurate but slow due to limited L shoulder AROM Motor  Motor Motor: Hemiplegia  Trunk/Postural Assessment  Cervical Assessment Cervical Assessment: Exceptions to [facility] (forward head) Thoracic Assessment Thoracic Assessment: Exceptions to [facility] (rounded shoulders) Lumbar Assessment Lumbar Assessment: Exceptions to [facility] (posterior pelvic tilt) Postural Control Postural Control: Deficits on evaluation (slightly delayed)  Balance Static Sitting Balance Static Sitting - Level of Assistance: 5: Stand by assistance Dynamic Sitting Balance Dynamic Sitting - Level of Assistance: 4: Min assist Static Standing Balance Static Standing - Level of Assistance: 4: Min assist Dynamic Standing Balance Dynamic Standing - Level of Assistance: 3: Mod assist Extremity Assessment  RUE Assessment Active Range of Motion (AROM) Comments: 100 degrees of shoulder flexion, General Strength Comments: grasp fair with partial claw hand contracture but pt able to use hand during self care ADLs LUE Assessment Passive Range of Motion (PROM) Comments: 100 sh flexion Active Range of Motion (AROM)  Comments: 30 degrees sh flexion ,  has full AROM of elbow and hand General Strength Comments: 2- in shoulder, 3+ elbow, 4/5 hand RLE Assessment RLE Assessment: Exceptions to [facility] General Strength Comments: Grossly 4/5 LLE Assessment LLE Assessment: Exceptions to [facility] General Strength Comments: Grossly 4/5  Care Tool Care Tool Bed Mobility Roll left and right activity   Roll left and right assist level: Supervision/Verbal cueing    Sit to lying activity   Sit to lying assist level: Supervision/Verbal cueing    Lying to sitting on side of bed activity   Lying to sitting on side of bed assist level: the ability to move from lying on the back to sitting on the side of the bed with no back support.: Supervision/Verbal cueing     Care Tool Transfers Sit to stand transfer   Sit to stand assist level: Minimal Assistance - Patient > 75%    Chair/bed transfer   Chair/bed transfer assist level: Minimal Assistance - Patient > 75%    Car transfer   Car transfer assist level: Minimal Assistance - Patient > 75%      Care Tool Locomotion Ambulation   Assist level: Minimal Assistance - Patient > 75% Assistive device: Walker-rolling Max distance: 50'  Walk 10 feet activity   Assist level: Minimal Assistance - Patient > 75% Assistive device: Walker-rolling   Walk 50 feet with 2 turns activity   Assist level: Minimal Assistance - Patient > 75% Assistive device: Walker-rolling  Walk 150 feet activity Walk 150 feet activity did not occur: Safety/medical concerns (fatigue)      Walk 10 feet on uneven surfaces activity   Assist level: Minimal Assistance - Patient > 75%    Stairs   Assist level: Minimal Assistance - Patient > 75% Stairs assistive device: 2 hand rails Max number of stairs: 8  Walk up/down 1 step activity   Walk up/down 1 step (curb) assist level: Minimal Assistance - Patient > 75% Walk up/down 1 step or curb assistive device: 2 hand rails  Walk up/down 4 steps activity    Walk up/down 4 steps assist level: Minimal Assistance - Patient > 75% Walk up/down 4 steps assistive device: 2 hand rails  Walk up/down 12 steps activity Walk up/down 12 steps activity did not occur: Safety/medical concerns (fatigue)      Pick up small objects from floor   Pick up small object  from the floor assist level: Moderate Assistance - Patient 50 - 74%    Wheelchair Is the patient using a wheelchair?: Yes Type of Wheelchair: Manual   Wheelchair assist level: Dependent - Patient 0% Max wheelchair distance: 150'  Wheel 50 feet with 2 turns activity   Assist Level: Dependent - Patient 0%  Wheel 150 feet activity   Assist Level: Dependent - Patient 0%    Refer to Care Plan for Long Term Goals  SHORT TERM GOAL WEEK 1 PT Short Term Goal 1 (Week 1): STGs = LTGs  Recommendations for other services: Therapeutic Recreation  Other dance  Skilled Therapeutic Intervention  Evaluation completed (see details above and below) with education on PT POC and goals and individual treatment initiated with focus on bed mobility, balance, transfers, ambulation, car transfers, and stair training. Pt received supine in bed and agrees to therapy. No complaint of pain. Supine to sit with cues for sequencing and use of bed features. Pt performs stand step transfer from bed to [facility] with minA and RW, with cues for hand placement, body mechanics, and eccentric control of stand to sit for safety and strengthening. WC transport to gym. Pt performs ramp navigation and car transfer with RW and cues for sequencing, with minA and pt ambulating 50'. Seated rest break. Pt then completes x8 6" steps with bilateral handrails and minA, with cues for step sequencing and safety. Pt declines to attempt additional stairs due to fatigue. Following extended seated rest break, pt stands with minA and RW, and ambulates x175' with cues to increase Rt stride length and step height for safety and to decrease risk for falls. Cues also  provided for safe AD management when transitioning back to WC. Pt performs ambulatory transfer to toilet with minA and cues for positioning. Following, pt left in [facility] with alarm intact and all needs within reach.   Mobility Bed Mobility Bed Mobility: Sit to Supine;Supine to Sit Supine to Sit: Supervision/Verbal cueing Sit to Supine: Supervision/Verbal cueing Transfers Transfers: Sit to Stand;Stand to Sit;Stand Pivot Transfers Sit to Stand: Minimal Assistance - Patient > 75% Stand to Sit: Minimal Assistance - Patient > 75% Stand Pivot Transfers: Minimal Assistance - Patient > 75% Stand Pivot Transfer Details: Verbal cues for sequencing;Verbal cues for technique;Verbal cues for precautions/safety;Tactile cues for sequencing Transfer (Assistive device): Rolling walker Locomotion  Gait Ambulation: Yes Gait Assistance: Minimal Assistance - Patient > 75% Gait Distance (Feet): 50 Feet Assistive device: Rolling walker Gait Assistance Details: Verbal cues for sequencing;Verbal cues for technique;Verbal cues for gait pattern;Tactile cues for posture Gait Gait: Yes Gait Pattern: Impaired Gait Pattern: Decreased stride length Gait velocity: decreased Stairs / Additional Locomotion Stairs: Yes Stairs Assistance: Minimal Assistance - Patient > 75% Stair Management Technique: Two rails Number of Stairs: 8 Height of Stairs: 6 Ramp: Minimal Assistance - Patient >75% Curb: Minimal Assistance - Patient >75% Wheelchair Mobility Wheelchair Mobility: No   Discharge Criteria: Patient will be discharged from PT if patient refuses treatment 3 consecutive times without medical reason, if treatment goals not met, if there is a change in medical status, if patient makes no progress towards goals or if patient is discharged from hospital.  The above assessment, treatment plan, treatment alternatives and goals were discussed and mutually agreed upon: by patient  Beau Fanny, PT, DPT 04/22/2023, 4:07 PM

## 2023-04-22 NOTE — Plan of Care (Signed)
  Problem: RH Balance Goal: LTG Patient will maintain dynamic standing with ADLs (OT) Description: LTG:  Patient will maintain dynamic standing balance with assist during activities of daily living (OT)  Flowsheets (Taken 04/22/2023 1242) LTG: Pt will maintain dynamic standing balance during ADLs with: Supervision/Verbal cueing   Problem: Sit to Stand Goal: LTG:  Patient will perform sit to stand in prep for activites of daily living with assistance level (OT) Description: LTG:  Patient will perform sit to stand in prep for activites of daily living with assistance level (OT) Flowsheets (Taken 04/22/2023 1242) LTG: PT will perform sit to stand in prep for activites of daily living with assistance level: Supervision/Verbal cueing   Problem: RH Eating Goal: LTG Patient will perform eating w/assist, cues/equip (OT) Description: LTG: Patient will perform eating with assist, with/without cues using equipment (OT) Flowsheets (Taken 04/22/2023 1242) LTG: Pt will perform eating with assistance level of: Independent   Problem: RH Grooming Goal: LTG Patient will perform grooming w/assist,cues/equip (OT) Description: LTG: Patient will perform grooming with assist, with/without cues using equipment (OT) Flowsheets (Taken 04/22/2023 1242) LTG: Pt will perform grooming with assistance level of: Independent   Problem: RH Bathing Goal: LTG Patient will bathe all body parts with assist levels (OT) Description: LTG: Patient will bathe all body parts with assist levels (OT) Flowsheets (Taken 04/22/2023 1242) LTG: Pt will perform bathing with assistance level/cueing: Supervision/Verbal cueing   Problem: RH Dressing Goal: LTG Patient will perform upper body dressing (OT) Description: LTG Patient will perform upper body dressing with assist, with/without cues (OT). Flowsheets (Taken 04/22/2023 1242) LTG: Pt will perform upper body dressing with assistance level of: Supervision/Verbal cueing Goal: LTG Patient will  perform lower body dressing w/assist (OT) Description: LTG: Patient will perform lower body dressing with assist, with/without cues in positioning using equipment (OT) Flowsheets (Taken 04/22/2023 1242) LTG: Pt will perform lower body dressing with assistance level of: Supervision/Verbal cueing   Problem: RH Toileting Goal: LTG Patient will perform toileting task (3/3 steps) with assistance level (OT) Description: LTG: Patient will perform toileting task (3/3 steps) with assistance level (OT)  Flowsheets (Taken 04/22/2023 1242) LTG: Pt will perform toileting task (3/3 steps) with assistance level: Supervision/Verbal cueing   Problem: RH Functional Use of Upper Extremity Goal: LTG Patient will use RT/LT upper extremity as a (OT) Description: LTG: Patient will use right/left upper extremity as a stabilizer/gross assist/diminished/nondominant/dominant level with assist, with/without cues during functional activity (OT) Flowsheets (Taken 04/22/2023 1242) LTG: Use of upper extremity in functional activities: LUE as diminished level LTG: Pt will use upper extremity in functional activity with assistance level of: Supervision/Verbal cueing   Problem: RH Toilet Transfers Goal: LTG Patient will perform toilet transfers w/assist (OT) Description: LTG: Patient will perform toilet transfers with assist, with/without cues using equipment (OT) Flowsheets (Taken 04/22/2023 1242) LTG: Pt will perform toilet transfers with assistance level of: Supervision/Verbal cueing   Problem: RH Tub/Shower Transfers Goal: LTG Patient will perform tub/shower transfers w/assist (OT) Description: LTG: Patient will perform tub/shower transfers with assist, with/without cues using equipment (OT) Flowsheets (Taken 04/22/2023 1242) LTG: Pt will perform tub/shower stall transfers with assistance level of: Supervision/Verbal cueing LTG: Pt will perform tub/shower transfers from: Walk in shower

## 2023-04-22 NOTE — Telephone Encounter (Signed)
 [name] is aware of scheduled appointment times/dates with [facility]

## 2023-04-22 NOTE — Evaluation (Signed)
 Occupational Therapy Assessment and Plan  Patient Details  Name: Frances Kent MRN: [id number] Date of Birth: 02-Jul-1930  OT Diagnosis: hemiplegia affecting dominant side and muscle weakness (generalized) Rehab Potential: Rehab Potential (ACUTE ONLY): Good ELOS: 7-10 days   Today's Date: 04/22/2023 OT Individual Time: 8657-8469 OT Individual Time Calculation (min): 75 min     Hospital Problem: Principal Problem:   Right middle cerebral artery stroke [facility])   Past Medical History:  Past Medical History:  Diagnosis Date   Arthritis    Atrial fibrillation (HCC)    Benign breast cyst in female, left 10/07/2016   Candida esophagitis (HCC) 05/03/2021   Found on Mar 10 2021 EGD    CAP (community acquired pneumonia) 02/15/2021   CHF (congestive heart failure) (HCC)    Colon adenomas    GERD (gastroesophageal reflux disease)    HCAP (healthcare-associated pneumonia) 02/02/2021   Hypertension    Hypothyroidism    Macular degeneration    Mitral regurgitation    Pulmonary embolism (HCC) 02/2016   Severe sepsis (HCC) 02/02/2021   Stroke (HCC) 10/2020   SVT (supraventricular tachycardia) (HCC)    Past Surgical History:  Past Surgical History:  Procedure Laterality Date   APPENDECTOMY  1960   BREAST CYST ASPIRATION Left 2017   CARDIOVERSION N/A 04/05/2016   Procedure: Cardioversion;  Surgeon: Lamar Blinks, MD;  Location: [facility] ORS;  Service: Cardiovascular;  Laterality: N/A;   CHOLECYSTECTOMY  1985   ESOPHAGOGASTRODUODENOSCOPY N/A 03/10/2021   Procedure: ESOPHAGOGASTRODUODENOSCOPY (EGD);  Surgeon: Toledo, Boykin Nearing, MD;  Location: [facility] ENDOSCOPY;  Service: Gastroenterology;  Laterality: N/A;   ESOPHAGOGASTRODUODENOSCOPY (EGD) WITH PROPOFOL N/A 11/27/2019   Procedure: ESOPHAGOGASTRODUODENOSCOPY (EGD) WITH PROPOFOL;  Surgeon: Regis Bill, MD;  Location: [facility] ENDOSCOPY;  Service: Endoscopy;  Laterality: N/A;   ESOPHAGOGASTRODUODENOSCOPY (EGD) WITH PROPOFOL N/A 12/24/2019    Procedure: ESOPHAGOGASTRODUODENOSCOPY (EGD) WITH PROPOFOL;  Surgeon: Regis Bill, MD;  Location: [facility] ENDOSCOPY;  Service: Endoscopy;  Laterality: N/A;   IR CT HEAD LTD  11/01/2020   IR CT HEAD LTD  04/19/2023   IR PERCUTANEOUS ART THROMBECTOMY/INFUSION INTRACRANIAL INC DIAG ANGIO  11/01/2020   IR PERCUTANEOUS ART THROMBECTOMY/INFUSION INTRACRANIAL INC DIAG ANGIO  04/19/2023   IR US GUIDE VASC ACCESS RIGHT  11/02/2020   OVARIAN CYST REMOVAL     PACEMAKER LEADLESS INSERTION N/A 11/16/2021   Procedure: PACEMAKER LEADLESS INSERTION;  Surgeon: Marcina Millard, MD;  Location: [facility] INVASIVE CV LAB;  Service: Cardiovascular;  Laterality: N/A;   RADIOLOGY WITH ANESTHESIA N/A 11/01/2020   Procedure: RADIOLOGY WITH ANESTHESIA;  Surgeon: Julieanne Cotton, MD;  Location: [facility] OR;  Service: Radiology;  Laterality: N/A;   RADIOLOGY WITH ANESTHESIA N/A 04/19/2023   Procedure: RADIOLOGY WITH ANESTHESIA;  Surgeon: Radiologist, Medication, MD;  Location: [facility] OR;  Service: Radiology;  Laterality: N/A;   TEE WITHOUT CARDIOVERSION N/A 02/01/2018   Procedure: TRANSESOPHAGEAL ECHOCARDIOGRAM (TEE);  Surgeon: Lamar Blinks, MD;  Location: [facility] ORS;  Service: Cardiovascular;  Laterality: N/A;    Assessment & Plan Clinical Impression:  Frances Kent is a 89 year old right-handed female fibrillation/SVT on Xarelto/pacemaker, recent COVID infection, CKD stage III diastolic congestive heart failure, hypertension, hypothyroidism, macular degeneration, mitral regurgitation, PE 2019 as well as prior CVA 9/22 without significant residual deficits. Per chart review patient lives alone. 1 level home with one-step to entry. Ambulatory with a rolling walker. Good support of local family. Presented 04/19/2023 with acute onset of aphasia as well as left-sided weakness. CT scan negative. CT a showed occlusion versus  critical stenosis of the right M1 MCA with asymmetrical diminished opacification of more distal right MCA  vessels. Suspected thrombus visualized within the M1 MCA and extends into proximal M2 MCA branches. MRI showed acute infarct right insula, right frontal lobe and the cerebellum bilateral. MRA showed no large vessel occlusion. Patient did not receive tPA. Admission chemistries unremarkable except creatinine 1.33. Echocardiogram with ejection fraction of 30 to 35%. Left ventricle moderately decreased function. Patient did undergo revascularization/thrombectomy per interventional radiology. She was slowly extubated. Initially on aspirin suppository switched to Eliquis. She is tolerating a regular diet. Therapy evaluations completed due to patient decreased functional ability left side weakness was admitted for a comprehensive rehab program. .  Patient transferred to CIR on 04/21/2023 .    Patient currently requires mod with basic self-care skills and min A with ADL transfers secondary to decreased cardiorespiratoy endurance, decreased coordination, and decreased standing balance, hemiplegia, and decreased balance strategies.  Prior to hospitalization, patient could complete BADLs with modified independent .  Patient will benefit from skilled intervention to increase independence with basic self-care skills prior to discharge home with care partner.  Anticipate patient will require intermittent supervision and follow up home health.  OT - End of Session Endurance Deficit: Yes OT Assessment Rehab Potential (ACUTE ONLY): Good OT Patient demonstrates impairments in the following area(s): Balance;Endurance;Motor OT Basic ADL's Functional Problem(s): Bathing;Dressing;Toileting OT Transfers Functional Problem(s): Toilet;Tub/Shower OT Additional Impairment(s): Fuctional Use of Upper Extremity OT Plan OT Intensity: Minimum of 1-2 x/day, 45 to 90 minutes OT Frequency: 5 out of 7 days OT Duration/Estimated Length of Stay: 7-10 days OT Treatment/Interventions: Balance/vestibular training;Functional mobility  training;DME/adaptive equipment instruction;Discharge planning;Patient/family education;Psychosocial support;Self Care/advanced ADL retraining;Therapeutic Activities;Therapeutic Exercise;UE/LE Strength taining/ROM;UE/LE Coordination activities OT Self Feeding Anticipated Outcome(s): independent OT Basic Self-Care Anticipated Outcome(s): supervision OT Toileting Anticipated Outcome(s): supervision OT Bathroom Transfers Anticipated Outcome(s): supervision OT Recommendation Patient destination: Home Follow Up Recommendations: Home health OT Equipment Recommended: None recommended by OT Equipment Details: pt has a shower chair and BSC over toilet at home   OT Evaluation Precautions/Restrictions  Precautions Precautions: Fall Restrictions Weight Bearing Restrictions Per Provider Order: No    Pain Pain Assessment Pain Score: 0-No pain Home Living/Prior Functioning Home Living Family/patient expects to be discharged to:: Private residence Living Arrangements: Alone Available Help at Discharge: Family Type of Home: House Home Access: Level entry Home Layout: One level, Laundry or work area in basement [employer] Shower/Tub: Health visitor: Standard Additional Comments: pt lives "alone" but family there frequently.   Pt only moves from lift chair to her bed or to the bathroom by herself.  Prior to covid, pt showered and dressed herself.  Family states they will provide 24/7 supervision.  Has a shower seat and BSC over toilet.  Lives With: Alone Prior Function Level of Independence: Independent with basic ADLs, Independent with gait, Independent with transfers, Requires assistive device for independence, Other (comment) (used RW) Driving: No Vocation: Retired Administrator, sports Baseline Vision/History: 6 Macular Degeneration;1 Wears glasses Ability to See in Adequate Light: 0 Adequate Patient Visual Report: No change from baseline Vision Assessment?: Wears glasses for  reading Additional Comments: pt reports no change in vision with this CVA, but has decreased acuity in L eye (PLOF) Perception  Perception: Within Functional Limits Praxis Praxis: WFL Cognition Cognition Overall Cognitive Status: Within Functional Limits for tasks assessed Orientation Level: Person;Place;Situation Person: Oriented Place: Oriented Situation: Oriented Memory: Appears intact Awareness: Appears intact Safety/Judgment: Appears intact Brief Interview for Mental Status (BIMS) Repetition  of Three Words (First Attempt): 3 Temporal Orientation: Year: Correct Temporal Orientation: Month: Accurate within 5 days Temporal Orientation: Day: Correct Recall: "Sock": Yes, no cue required Recall: "Blue": Yes, no cue required Recall: "Bed": Yes, no cue required BIMS Summary Score: 15 Sensation Sensation Light Touch: Appears Intact Hot/Cold: Appears Intact Proprioception: Appears Intact Stereognosis: Not tested Coordination Gross Motor Movements are Fluid and Coordinated: No Fine Motor Movements are Fluid and Coordinated: No Coordination and Movement Description: decreased on L due to new onset of weakness,  R resting tremor Finger Nose Finger Test: accurate but slow due to limited L shoulder AROM Motor  Motor Motor: Hemiplegia  Trunk/Postural Assessment  Cervical Assessment Cervical Assessment: Exceptions to [facility] (forward head) Thoracic Assessment Thoracic Assessment: Exceptions to [facility] (rounded shoulders) Lumbar Assessment Lumbar Assessment: Exceptions to [facility] (posterior pelvic tilt) Postural Control Postural Control: Deficits on evaluation (slightly delayed)  Balance Static Sitting Balance Static Sitting - Level of Assistance: 5: Stand by assistance Dynamic Sitting Balance Dynamic Sitting - Level of Assistance: 4: Min assist Static Standing Balance Static Standing - Level of Assistance: 4: Min assist Dynamic Standing Balance Dynamic Standing - Level of Assistance:  3: Mod assist Extremity/Trunk Assessment RUE Assessment Active Range of Motion (AROM) Comments: 100 degrees of shoulder flexion, General Strength Comments: grasp fair with partial claw hand contracture but pt able to use hand during self care ADLs LUE Assessment Passive Range of Motion (PROM) Comments: 100 sh flexion Active Range of Motion (AROM) Comments: 30 degrees sh flexion ,  has full AROM of elbow and hand General Strength Comments: 2- in shoulder, 3+ elbow, 4/5 hand  Care Tool Care Tool Self Care Eating   Eating Assist Level: Set up assist    Oral Care    Oral Care Assist Level: Minimal Assistance - Patient > 75%    Bathing   Body parts bathed by patient: Right arm;Left arm;Chest;Abdomen;Front perineal area;Right upper leg;Left upper leg;Face Body parts bathed by helper: Right lower leg;Left lower leg;Buttocks   Assist Level: Moderate Assistance - Patient 50 - 74%    Upper Body Dressing(including orthotics)   What is the patient wearing?: Pull over shirt   Assist Level: Moderate Assistance - Patient 50 - 74%    Lower Body Dressing (excluding footwear)   What is the patient wearing?: Pants;Incontinence brief Assist for lower body dressing: Maximal Assistance - Patient 25 - 49%    Putting on/Taking off footwear   What is the patient wearing?: Non-skid slipper socks;Ted hose Assist for footwear: Total Assistance - Patient < 25%       Care Tool Toileting Toileting activity   Assist for toileting: Moderate Assistance - Patient 50 - 74%     Care Tool Bed Mobility Roll left and right activity   Roll left and right assist level: Supervision/Verbal cueing    Sit to lying activity   Sit to lying assist level: Supervision/Verbal cueing    Lying to sitting on side of bed activity   Lying to sitting on side of bed assist level: the ability to move from lying on the back to sitting on the side of the bed with no back support.: Supervision/Verbal cueing     Care Tool  Transfers Sit to stand transfer   Sit to stand assist level: Minimal Assistance - Patient > 75%    Chair/bed transfer   Chair/bed transfer assist level: Minimal Assistance - Patient > 75%     Toilet transfer   Assist Level: Minimal Assistance - Patient >  75%     Care Tool Cognition  Expression of Ideas and Wants Expression of Ideas and Wants: 4. Without difficulty (complex and basic) - expresses complex messages without difficulty and with speech that is clear and easy to understand  Understanding Verbal and Non-Verbal Content Understanding Verbal and Non-Verbal Content: 4. Understands (complex and basic) - clear comprehension without cues or repetitions   Memory/Recall Ability Memory/Recall Ability : That he or she is in a hospital/hospital unit;Current season   Refer to Care Plan for Long Term Goals  SHORT TERM GOAL WEEK 1 OT Short Term Goal 1 (Week 1): Pt will be able to ambulate to toilet with RW with light CGA to demonstrate improved activity tolerance and L side strength. OT Short Term Goal 2 (Week 1): Pt will don shirt with supervision. OT Short Term Goal 3 (Week 1): Pt will be able to shower using shower seat demonstrating improved activity tolerance. OT Short Term Goal 4 (Week 1): Pt will be able to actively lift LUE to 45 degrees to increase ease of UE dressing.  Recommendations for other services: None    Skilled Therapeutic Intervention ADL ADL Eating: Set up Grooming: Setup Upper Body Bathing: Supervision/safety Where Assessed-Upper Body Bathing: Edge of bed Lower Body Bathing: Minimal assistance Where Assessed-Lower Body Bathing: Edge of bed Upper Body Dressing: Moderate assistance Where Assessed-Upper Body Dressing: Edge of bed Lower Body Dressing: Moderate assistance Where Assessed-Lower Body Dressing: Edge of bed Toileting: Moderate assistance Where Assessed-Toileting: Bedside Commode Toilet Transfer: Minimal assistance Toilet Transfer Method: Stand  pivot Toilet Transfer Equipment: Bedside commode Mobility  Bed Mobility Bed Mobility: Sit to Supine;Supine to Sit Supine to Sit: Supervision/Verbal cueing Sit to Supine: Supervision/Verbal cueing Transfers Sit to Stand: Minimal Assistance - Patient > 75% Stand to Sit: Minimal Assistance - Patient > 75%  Pt seen for initial evaluation and ADL training with a focus on safe functional mobility and use of L side.  Pt agreeable to sitting to EOB to bathe, dress and then practice toilet transfers. She was concerned about getting too fatigued, but she tolerated the session well with a few short rest breaks.  Heart rate and O2 levels WNL during session.  Pt's has increased weakness in L shoulder and has challenges with dynamic standing balance.  Reviewed role of OT, discussed POC and pt's goals, and ELOS. Pt resting in w/c With all needs met and belt  alarm set.      Discharge Criteria: Patient will be discharged from OT if patient refuses treatment 3 consecutive times without medical reason, if treatment goals not met, if there is a change in medical status, if patient makes no progress towards goals or if patient is discharged from hospital.  The above assessment, treatment plan, treatment alternatives and goals were discussed and mutually agreed upon: by patient  [facility] 04/22/2023, 12:36 PM

## 2023-04-22 NOTE — Evaluation (Signed)
 Speech Language Pathology Assessment and Plan  Patient Details  Name: KYREE FEDORKO MRN: [id number] Date of Birth: 23-Jun-1930  SLP Diagnosis: Dysphagia  Rehab Potential: Good ELOS: 7-10 days    Today's Date: 04/22/2023 SLP Individual Time: 1400-1430 SLP Individual Time Calculation (min): 30 min   Hospital Problem: Principal Problem:   Right middle cerebral artery stroke [facility])  Past Medical History:  Past Medical History:  Diagnosis Date   Arthritis    Atrial fibrillation (HCC)    Benign breast cyst in female, left 10/07/2016   Candida esophagitis (HCC) 05/03/2021   Found on Mar 10 2021 EGD    CAP (community acquired pneumonia) 02/15/2021   CHF (congestive heart failure) (HCC)    Colon adenomas    GERD (gastroesophageal reflux disease)    HCAP (healthcare-associated pneumonia) 02/02/2021   Hypertension    Hypothyroidism    Macular degeneration    Mitral regurgitation    Pulmonary embolism (HCC) 02/2016   Severe sepsis (HCC) 02/02/2021   Stroke (HCC) 10/2020   SVT (supraventricular tachycardia) (HCC)    Past Surgical History:  Past Surgical History:  Procedure Laterality Date   APPENDECTOMY  1960   BREAST CYST ASPIRATION Left 2017   CARDIOVERSION N/A 04/05/2016   Procedure: Cardioversion;  Surgeon: Lamar Blinks, MD;  Location: [facility] ORS;  Service: Cardiovascular;  Laterality: N/A;   CHOLECYSTECTOMY  1985   ESOPHAGOGASTRODUODENOSCOPY N/A 03/10/2021   Procedure: ESOPHAGOGASTRODUODENOSCOPY (EGD);  Surgeon: Toledo, Boykin Nearing, MD;  Location: [facility] ENDOSCOPY;  Service: Gastroenterology;  Laterality: N/A;   ESOPHAGOGASTRODUODENOSCOPY (EGD) WITH PROPOFOL N/A 11/27/2019   Procedure: ESOPHAGOGASTRODUODENOSCOPY (EGD) WITH PROPOFOL;  Surgeon: Regis Bill, MD;  Location: [facility] ENDOSCOPY;  Service: Endoscopy;  Laterality: N/A;   ESOPHAGOGASTRODUODENOSCOPY (EGD) WITH PROPOFOL N/A 12/24/2019   Procedure: ESOPHAGOGASTRODUODENOSCOPY (EGD) WITH PROPOFOL;  Surgeon: Regis Bill, MD;  Location: [facility] ENDOSCOPY;  Service: Endoscopy;  Laterality: N/A;   IR CT HEAD LTD  11/01/2020   IR CT HEAD LTD  04/19/2023   IR PERCUTANEOUS ART THROMBECTOMY/INFUSION INTRACRANIAL INC DIAG ANGIO  11/01/2020   IR PERCUTANEOUS ART THROMBECTOMY/INFUSION INTRACRANIAL INC DIAG ANGIO  04/19/2023   IR US GUIDE VASC ACCESS RIGHT  11/02/2020   OVARIAN CYST REMOVAL     PACEMAKER LEADLESS INSERTION N/A 11/16/2021   Procedure: PACEMAKER LEADLESS INSERTION;  Surgeon: Marcina Millard, MD;  Location: [facility] INVASIVE CV LAB;  Service: Cardiovascular;  Laterality: N/A;   RADIOLOGY WITH ANESTHESIA N/A 11/01/2020   Procedure: RADIOLOGY WITH ANESTHESIA;  Surgeon: Julieanne Cotton, MD;  Location: [facility] OR;  Service: Radiology;  Laterality: N/A;   RADIOLOGY WITH ANESTHESIA N/A 04/19/2023   Procedure: RADIOLOGY WITH ANESTHESIA;  Surgeon: Radiologist, Medication, MD;  Location: [facility] OR;  Service: Radiology;  Laterality: N/A;   TEE WITHOUT CARDIOVERSION N/A 02/01/2018   Procedure: TRANSESOPHAGEAL ECHOCARDIOGRAM (TEE);  Surgeon: Lamar Blinks, MD;  Location: [facility] ORS;  Service: Cardiovascular;  Laterality: N/A;    Assessment / Plan / Recommendation Clinical Impression  shannon balthazar is a 89 year old right-handed female fibrillation/SVT on Xarelto/pacemaker, recent COVID infection, CKD stage III diastolic congestive heart failure, hypertension, hypothyroidism, macular degeneration, mitral regurgitation, PE 2019 as well as prior CVA 9/22 without significant residual deficits. Per chart review patient lives alone. 1 level home with one-step to entry. Ambulatory with a rolling walker. Good support of local family. Presented 04/19/2023 with acute onset of aphasia as well as left-sided weakness. CT scan negative. CT a showed occlusion versus critical stenosis of the right M1 MCA with asymmetrical  diminished opacification of more distal right MCA vessels. Suspected thrombus visualized within the M1 MCA and extends  into proximal M2 MCA branches. MRI showed acute infarct right insula, right frontal lobe and the cerebellum bilateral. MRA showed no large vessel occlusion. Patient did not receive tPA. Admission chemistries unremarkable except creatinine 1.33. Echocardiogram with ejection fraction of 30 to 35%. Left ventricle moderately decreased function. Patient did undergo revascularization/thrombectomy per interventional radiology. She was slowly extubated. Initially on aspirin suppository switched to Eliquis. She is tolerating a regular diet. Therapy evaluations completed due to patient decreased functional ability left side weakness was admitted for a comprehensive rehab program.   Pt presents with mild cognitive linguistic skills in area of attention and recall of information after a delay. Pt managed her medications on her own prior to hospitalization. She has baseline difficulty with meats and explained that she has always ate slowly and taken very small bites. She was full when St arrived and did not want to complete any po trials but denied difficulty with meal. She will benefit from skilled intervention to ensure safety with diet and ensure safety and independence upon return to prior living situation.    Skilled Therapeutic Interventions          SLP evaluation completed. Pt in agreement with plan of care and goals.   SLP Assessment  Patient will need skilled Speech Lanaguage Pathology Services during CIR admission    Recommendations  Patient destination: Home Follow up Recommendations: Home Health SLP Equipment Recommended: None recommended by SLP    SLP Frequency 3 to 5 out of 7 days   SLP Duration  SLP Intensity  SLP Treatment/Interventions 7-10 days  Minumum of 1-2 x/day, 30 to 90 minutes  Speech/Language facilitation;Therapeutic Activities;Dysphagia/aspiration precaution training    Pain Pain Assessment Pain Scale: Faces Pain Score: 0-No pain  Prior Functioning Cognitive/Linguistic  Baseline: Baseline deficits Baseline deficit details: baseline mild cognitive-linguistic and speech impairment following CVA 2022 Type of Home: House Available Help at Discharge: Family Vocation: Retired  SLP Evaluation Cognition Overall Cognitive Status: History of cognitive impairments - at baseline Arousal/Alertness: Awake/alert Orientation Level: Oriented X4 Year: 2025 Month: March Day of Week: Correct Memory: Impaired Memory Impairment: Retrieval deficit Awareness: Appears intact Problem Solving: Impaired Problem Solving Impairment: Verbal complex Safety/Judgment: Appears intact  Comprehension Auditory Comprehension Overall Auditory Comprehension: Appears within functional limits for tasks assessed Expression Verbal Expression Overall Verbal Expression: Appears within functional limits for tasks assessed Oral Motor    Care Tool Care Tool Cognition Ability to hear (with hearing aid or hearing appliances if normally used Ability to hear (with hearing aid or hearing appliances if normally used): 0. Adequate - no difficulty in normal conservation, social interaction, listening to TV   Expression of Ideas and Wants Expression of Ideas and Wants: 4. Without difficulty (complex and basic) - expresses complex messages without difficulty and with speech that is clear and easy to understand   Understanding Verbal and Non-Verbal Content Understanding Verbal and Non-Verbal Content: 4. Understands (complex and basic) - clear comprehension without cues or repetitions  Memory/Recall Ability Memory/Recall Ability : That he or she is in a hospital/hospital unit;Current season   PMSV Assessment  PMSV Trial    Bedside Swallowing Assessment General Date of Onset: 04/19/23 Oral Cavity - Dentition: Missing dentition Self-Feeding Abilities: Needs assist;Needs set up Patient Positioning: Upright in bed  Oral Care Assessment Oral Assessment  (WDL): Within Defined Limits Ice Chips    Thin Liquid Thin Liquid: Within functional limits  Solid Solid: Not tested BSE Assessment Risk for Aspiration Impact on safety and function: Mild aspiration risk Other Related Risk Factors: History of dysphagia;History of esophageal-related issues  Short Term Goals: Week 1: SLP Short Term Goal 1 (Week 1): Pt will complete medication management tasks with supervision cues. SLP Short Term Goal 2 (Week 1): Pt will tolerate regular diet and thin liquids with use of safe swallow precautions and no overt s/sx of aspiration or penetration. SLP Short Term Goal 3 (Week 1): Pt will complete selective attention tasks for 7-8 min with min A.  Refer to Care Plan for Long Term Goals  Recommendations for other services: None   Discharge Criteria: Patient will be discharged from SLP if patient refuses treatment 3 consecutive times without medical reason, if treatment goals not met, if there is a change in medical status, if patient makes no progress towards goals or if patient is discharged from hospital.  The above assessment, treatment plan, treatment alternatives and goals were discussed and mutually agreed upon: by patient  Amil Amen A Gennavieve Huq 04/22/2023, 3:17 PM

## 2023-04-22 NOTE — Progress Notes (Signed)
 PROGRESS NOTE   Subjective/Complaints:   ROS:  Pt kept repeating needs to be on fluid pills to prevent Swelling and fluid overload.   Reports took lasix at home- per chart, sometimes took Lasix 20 mg daily and sometimes not per pharmacy review.   LBM x2 2 days ago- said not eating a lot- and denies constipation-  Denies pain L forearm- IV   ROS:  Pt denies SOB, abd pain, CP, N/V/C/D, and vision changes Objective:   DG Swallowing Func-Speech Pathology Result Date: 04/20/2023 Table formatting from the original result was not included. Modified Barium Swallow Study Patient Details Name: Frances Kent MRN: [id number] Date of Birth: 11/27/1930 Today's Date: 04/20/2023 HPI/PMH: HPI: Frances Kent is a 89 yo female presenting to ED 3/5 with acute onset of aphasia and L sided weakness. MRI - Acute infarcts in the right insula, right frontal lobe and the cerebellum bilaterally. Status post IR revascularization 3/5.  MBS 02/2021 - generally functional swallow with some oral holding but no aspiration, no residue. Pt previously seen by SLP in [facility] for cognitive impairments (problem solving, divergent naming, memory retrieval) s/p L basal ganglia CVA 2022. PMH includes A-fib, pacemaker, arthritis, recent COVID infection, GERD, HFrEF, HTN, macular degeneration, hypothyroidism, mitral regurgitation, PE (2019), prior CVA without residual deficits, SVT Clinical Impression: Clinical Impression: Pt presents with very functional swallowing marked only by occasional oral holding and portioning of bolus that she allows to move into the pharynx a bit at a time.  Laryngeal vestibule closure was reliable with no penetration nor aspiration. Pharyngeal stripping and base of tongue retraction were strong, leading to effective clearance of material through the pharynx. Pt declined to swallow the barium pill, stating that she breaks her pills up at baseline.   Recommend resuming a regular diet, thin liquids. She was told several years ago that she should not use straws - today she drank from a straw normally without any concerns.  Crush meds.Will need assist with feeding due to tremors. No further SLP f/u for swallowing is needed. Conveyed results to RN> Factors that may increase risk of adverse event in presence of aspiration Frances Kent & Clearance Coots 2021): No data recorded Recommendations/Plan: Swallowing Evaluation Recommendations Swallowing Evaluation Recommendations Recommendations: PO diet PO Diet Recommendation: Regular; Thin liquids (Level 0) Liquid Administration via: Cup; Straw Medication Administration: Crushed with puree Supervision: Patient able to self-feed; Staff to assist with self-feeding Oral care recommendations: Oral care BID (2x/day) Treatment Plan Treatment Plan Treatment recommendations: No treatment recommended at this time Follow-up recommendations: No SLP follow up Recommendations Recommendations for follow up therapy are one component of a multi-disciplinary discharge planning process, led by the attending physician.  Recommendations may be updated based on patient status, additional functional criteria and insurance authorization. Assessment: Orofacial Exam: Orofacial Exam Oral Cavity - Dentition: Missing dentition; Edentulous Orofacial Anatomy: WFL Anatomy: Anatomy: WFL Boluses Administered: Boluses Administered Boluses Administered: Thin liquids (Level 0); Puree; Solid  Oral Impairment Domain: Oral Impairment Domain Lip Closure: No labial escape Tongue control during bolus hold: Cohesive bolus between tongue to palatal seal Bolus preparation/mastication: Slow prolonged chewing/mashing with complete recollection Bolus transport/lingual motion: Delayed initiation of tongue motion (oral holding)  Oral residue: Complete oral clearance Initiation of pharyngeal swallow : Valleculae  Pharyngeal Impairment Domain: Pharyngeal Impairment Domain Soft palate  elevation: No bolus between soft palate (SP)/pharyngeal wall (PW) Laryngeal elevation: Complete superior movement of thyroid cartilage with complete approximation of arytenoids to epiglottic petiole Anterior hyoid excursion: Complete anterior movement Epiglottic movement: Complete inversion Laryngeal vestibule closure: Complete, no air/contrast in laryngeal vestibule Pharyngeal stripping wave : Present - complete Pharyngoesophageal segment opening: Partial distention/partial duration, partial obstruction of flow Tongue base retraction: No contrast between tongue base and posterior pharyngeal wall (PPW) Pharyngeal residue: Complete pharyngeal clearance  Esophageal Impairment Domain: No data recorded Pill: Pill Consistency administered: -- (pt declined, breaks pills into pieces at baseline) Penetration/Aspiration Scale Score: Penetration/Aspiration Scale Score 1.  Material does not enter airway: Thin liquids (Level 0); Puree; Solid Compensatory Strategies: Compensatory Strategies Compensatory strategies: Yes Straw: Effective   General Information: Caregiver present: No  Diet Prior to this Study: NPO   Temperature : Normal   Respiratory Status: WFL   Supplemental O2: None (Room air)   History of Recent Intubation: No  Behavior/Cognition: Alert; Cooperative; Pleasant mood Self-Feeding Abilities: Able to self-feed (tremor) Baseline vocal quality/speech: Normal Volitional Cough: Able to elicit Volitional Swallow: Able to elicit Exam Limitations: No limitations Goal Planning: Prognosis for improved oropharyngeal function: Good No data recorded No data recorded Patient/Family Stated Goal: nothing specific No data recorded Pain: Pain Assessment Pain Assessment: No/denies pain Facial Expression: 0 Body Movements: 0 Muscle Tension: 0 Compliance with ventilator (intubated pts.): 0 Vocalization (extubated pts.): N/A CPOT Total: 0 End of Session: Start Time:SLP Start Time (ACUTE ONLY): 1440 Stop Time: SLP Stop Time (ACUTE ONLY):  1456 Time Calculation:SLP Time Calculation (min) (ACUTE ONLY): 16 min Charges: SLP Evaluations $ SLP Speech Visit: 1 Visit SLP Evaluations $BSS Swallow: 1 Procedure $MBS Swallow: 1 Procedure $ SLP EVAL LANGUAGE/SOUND PRODUCTION: 1 Procedure SLP visit diagnosis: SLP Visit Diagnosis: Dysphagia, unspecified (R13.10) Past Medical History: Past Medical History: Diagnosis Date  Arthritis   Atrial fibrillation (HCC)   Benign breast cyst in female, left 10/07/2016  Candida esophagitis (HCC) 05/03/2021  Found on Mar 10 2021 EGD   CAP (community acquired pneumonia) 02/15/2021  CHF (congestive heart failure) (HCC)   Colon adenomas   GERD (gastroesophageal reflux disease)   HCAP (healthcare-associated pneumonia) 02/02/2021  Hypertension   Hypothyroidism   Macular degeneration   Mitral regurgitation   Pulmonary embolism (HCC) 02/2016  Severe sepsis (HCC) 02/02/2021  Stroke (HCC) 10/2020  SVT (supraventricular tachycardia) (HCC)  Past Surgical History: Past Surgical History: Procedure Laterality Date  APPENDECTOMY  1960  BREAST CYST ASPIRATION Left 2017  CARDIOVERSION N/A 04/05/2016  Procedure: Cardioversion;  Surgeon: Lamar Blinks, MD;  Location: [facility];  Service: Cardiovascular;  Laterality: N/A;  CHOLECYSTECTOMY  1985  ESOPHAGOGASTRODUODENOSCOPY N/A 03/10/2021  Procedure: ESOPHAGOGASTRODUODENOSCOPY (EGD);  Surgeon: Toledo, Boykin Nearing, MD;  Location: [facility];  Service: Gastroenterology;  Laterality: N/A;  ESOPHAGOGASTRODUODENOSCOPY (EGD) WITH PROPOFOL N/A 11/27/2019  Procedure: ESOPHAGOGASTRODUODENOSCOPY (EGD) WITH PROPOFOL;  Surgeon: Regis Bill, MD;  Location: [facility];  Service: Endoscopy;  Laterality: N/A;  ESOPHAGOGASTRODUODENOSCOPY (EGD) WITH PROPOFOL N/A 12/24/2019  Procedure: ESOPHAGOGASTRODUODENOSCOPY (EGD) WITH PROPOFOL;  Surgeon: Regis Bill, MD;  Location: [facility];  Service: Endoscopy;  Laterality: N/A;  IR CT HEAD LTD  11/01/2020  IR CT HEAD LTD  04/19/2023  IR PERCUTANEOUS  ART THROMBECTOMY/INFUSION INTRACRANIAL INC DIAG ANGIO  11/01/2020  IR PERCUTANEOUS ART THROMBECTOMY/INFUSION INTRACRANIAL INC DIAG ANGIO  04/19/2023  IR US GUIDE VASC ACCESS  RIGHT  11/02/2020  OVARIAN CYST REMOVAL    PACEMAKER LEADLESS INSERTION N/A 11/16/2021  Procedure: PACEMAKER LEADLESS INSERTION;  Surgeon: Marcina Millard, MD;  Location: [facility];  Service: Cardiovascular;  Laterality: N/A;  RADIOLOGY WITH ANESTHESIA N/A 11/01/2020  Procedure: RADIOLOGY WITH ANESTHESIA;  Surgeon: Julieanne Cotton, MD;  Location: [facility];  Service: Radiology;  Laterality: N/A;  RADIOLOGY WITH ANESTHESIA N/A 04/19/2023  Procedure: RADIOLOGY WITH ANESTHESIA;  Surgeon: Radiologist, Medication, MD;  Location: [facility];  Service: Radiology;  Laterality: N/A;  TEE WITHOUT CARDIOVERSION N/A 02/01/2018  Procedure: TRANSESOPHAGEAL ECHOCARDIOGRAM (TEE);  Surgeon: Lamar Blinks, MD;  Location: [facility];  Service: Cardiovascular;  Laterality: N/A; Frances Side. Samson Frederic, MA CCC/SLP Clinical Specialist - Acute Care SLP Acute Rehabilitation Services Office number [phone] Frances Kent 04/20/2023, 2:23 PM  Recent Labs    04/20/23 0604 04/21/23 0712  WBC 7.8 6.9  HGB 12.0 11.0*  HCT 38.4 34.8*  PLT 216 167   Recent Labs    04/20/23 0604 04/21/23 0712  NA 139 140  K 5.7* 4.5  CL 111 109  CO2 24 24  GLUCOSE 99 86  BUN 16 21  CREATININE 1.39* 1.38*  CALCIUM 8.7* 8.6*   No intake or output data in the 24 hours ending 04/22/23 1239   Pressure Injury 04/21/23 Coccyx Mid Stage 2 -  Partial thickness loss of dermis presenting as a shallow open injury with a red, pink wound bed without slough. stage 2 to coccyx with 2 small areas close to merging into 1 area (Active)  04/21/23 1754  Location: Coccyx  Location Orientation: Mid  Staging: Stage 2 -  Partial thickness loss of dermis presenting as a shallow open injury with a red, pink wound bed without slough.  Wound Description (Comments): stage 2 to  coccyx with 2 small areas close to merging into 1 area  Present on Admission: Yes    Physical Exam: Vital Signs Blood pressure (!) 143/76, pulse 66, temperature 97.7 F (36.5 C), resp. rate 16, height 5\' 3"  (1.6 m), SpO2 96%.    General: awake, alert, appropriate, sitting up slightly in bed; nursing at bedside; NAD HENT: conjugate gaze; oropharynx moist CV: regular rate and rhythm; no JVD Pulmonary: CTA B/L; no W/R/R- good air movement- completely clear GI: soft, NT, ND, (+)BS Psychiatric: appropriate Neurological: Ox3- but perseverating on fluid pill Extremities- no LE edema at all- not even trace Skin: C/D/I. No apparent lesions.  IV intact. -Slight MASD on coccyx  MSK:      Right upper extremity digits 3-4 will crack and sublux slightly at the MCPs when extended to neutral.             Otherwise, full active range of motion       Neurologic exam:  Cognition: AAO to person, place, time and event.  Language: Fluent, No substitutions or neoglisms. No dysarthria. Names 3/3 objects correctly.  Memory: Recalls 2/3 objects at 5 minutes. No apparent deficits  Insight: Good insight into current condition.  Mood: Pleasant affect, appropriate mood.  Sensation: To light touch intact in BL UEs and LEs  Reflexes: 2+ in BL UE and LEs. Negative Hoffman's and babinski signs bilaterally.  CN: 2-12 grossly intact.  Coordination: No ataxia on FTN, HTS bilaterally.  During exam, will intermittently go into clonus like shaking of bilateral lower extremities--Per family, she does this when anxious, is baseline.   Spasticity: MAS 1 in right finger flexors, plantar flexors      Strength:  RUE: 5/5 SA, 5/5 EF, 5/5 EE, 4/5 WE, 4/5 FF, 4/5 FA                LUE:  5/5 SA, 5/5 EF, 5/5 EE, 5/5 WE, 5/5 FF, 5/5 FA                RLE: 5/5 HF, 5/5 KE, 4/5  DF, 4/5  EHL, 4/5  PF                 LLE:  5/5 HF, 5/5 KE, 5/5  DF, 5/5  EHL, 5/5  PF       Assessment/Plan: 1. Functional deficits  which require 3+ hours per day of interdisciplinary therapy in a comprehensive inpatient rehab setting. Physiatrist is providing close team supervision and 24 hour management of active medical problems listed below. Physiatrist and rehab team continue to assess barriers to discharge/monitor patient progress toward functional and medical goals  Care Tool:  Bathing    Body parts bathed by patient: Right arm, Left arm, Chest, Abdomen, Front perineal area, Right upper leg, Left upper leg, Face   Body parts bathed by helper: Right lower leg, Left lower leg, Buttocks     Bathing assist Assist Level: Moderate Assistance - Patient 50 - 74%     Upper Body Dressing/Undressing Upper body dressing   What is the patient wearing?: Pull over shirt    Upper body assist Assist Level: Moderate Assistance - Patient 50 - 74%    Lower Body Dressing/Undressing Lower body dressing      What is the patient wearing?: Pants, Incontinence brief     Lower body assist Assist for lower body dressing: Maximal Assistance - Patient 25 - 49%     Toileting Toileting    Toileting assist Assist for toileting: Moderate Assistance - Patient 50 - 74%     Transfers Chair/bed transfer  Transfers assist     Chair/bed transfer assist level: Minimal Assistance - Patient > 75%     Locomotion Ambulation   Ambulation assist      Assist level: Minimal Assistance - Patient > 75% Assistive device: Walker-rolling Max distance: 50'   Walk 10 feet activity   Assist     Assist level: Minimal Assistance - Patient > 75% Assistive device: Walker-rolling   Walk 50 feet activity   Assist    Assist level: Minimal Assistance - Patient > 75% Assistive device: Walker-rolling    Walk 150 feet activity   Assist Walk 150 feet activity did not occur: Safety/medical concerns (fatigue)         Walk 10 feet on uneven surface  activity   Assist     Assist level: Minimal Assistance - Patient >  75%     Wheelchair     Assist Is the patient using a wheelchair?: Yes Type of Wheelchair: Manual    Wheelchair assist level: Dependent - Patient 0% Max wheelchair distance: 150'    Wheelchair 50 feet with 2 turns activity    Assist        Assist Level: Dependent - Patient 0%   Wheelchair 150 feet activity     Assist      Assist Level: Dependent - Patient 0%   Blood pressure (!) 143/76, pulse 66, temperature 97.7 F (36.5 C), resp. rate 16, height 5\' 3"  (1.6 m), SpO2 96%.   Medical Problem List and Plan: 1. Functional deficits secondary to right MCA and cerebellar infarct right M1 and ACA occlusion status post IR  thrombectomy with TICI3 and TICI2c reperfusion respectively, embolic secondary to A-fib even on Xarelto.             -patient may shower             -ELOS/Goals: 7 to 10 days, supervision PT/OT/SLP             -Stable to admit to inpatient rehab   First day of evaluations- Con't CIR PT, OT and SLP 2.  Antithrombotics: -DVT/anticoagulation:  Pharmaceutical: Eliquis             -antiplatelet therapy: N/A 3. Pain Management: Tylenol as needed 4. Mood/Behavior/Sleep: Provide emotional support             -antipsychotic agents: N/A             -Note, as baseline, she will going to shakes in her upper or lower extremities when she is "anxious"; appears like myoclonus   5. Neuropsych/cognition: This patient is ?capable of making decisions on her own behalf. 6. Skin/Wound Care: Routine skin checks 7. Fluids/Electrolytes/Nutrition: Routine in and outs with follow-up chemistries 8.  Hyperlipidemia.  Zetia 9.  Hypothyroidism.  Synthroid 10.  GERD.  Protonix 11.  CKD stage IIIa.  Follow-up chemistries  3/8- Cr stable at 1.38 12.  Atrial fibrillation/SVT/pacemaker.  Cardiac rate controlled.  Continue Eliquis. 13.  Recent COVID infection.  Patient remains afebrile 14.  Diastolic congestive heart failure.  Monitor for any signs of fluid overload  3/8- will  order daily weights as well as Lasix 10 mg daily- pt insiistent on Lasix daily- will lower dose to 10 mg from 20 mg- since wasn't clear if taking at home all the time and concerned about Cr.  15.  History of prior stroke.  Tone and weakness in right upper and lower extremity.    I spent a total of 39   minutes on total care today- >50% coordination of care- due to  D/w pharmacy about her Lasix dose that wasn't in meds- sarted Lasix and also added daily weights- will maintain IV for now since restarting Lasix.     LOS: 1 days A FACE TO FACE EVALUATION WAS PERFORMED  Frances Kent 04/22/2023, 12:39 PM

## 2023-04-23 DIAGNOSIS — I63511 Cerebral infarction due to unspecified occlusion or stenosis of right middle cerebral artery: Secondary | ICD-10-CM | POA: Diagnosis not present

## 2023-04-23 MED ORDER — DOCUSATE SODIUM 50 MG/5ML PO LIQD
100.0000 mg | Freq: Two times a day (BID) | ORAL | Status: DC
Start: 2023-04-23 — End: 2023-04-26
  Administered 2023-04-23 – 2023-04-25 (×3): 100 mg via ORAL
  Filled 2023-04-23 (×6): qty 10

## 2023-04-23 NOTE — Plan of Care (Signed)
  Problem: Consults Goal: RH STROKE PATIENT EDUCATION Description: See Patient Education module for education specifics  Outcome: Progressing   Problem: RH BOWEL ELIMINATION Goal: RH STG MANAGE BOWEL WITH ASSISTANCE Description: STG Manage Bowel with toileting Assistance. Outcome: Progressing Goal: RH STG MANAGE BOWEL W/MEDICATION W/ASSISTANCE Description: STG Manage Bowel with Medication with mod I Assistance. Outcome: Progressing

## 2023-04-23 NOTE — Progress Notes (Signed)
 PROGRESS NOTE   Subjective/Complaints:   Pt reports is doing well- still perseverating on fluid pills.  Said needs to to avoid swelling and fluid in lungs.  2 Bms 2 evenings ago.  Denies constipation     ROS:   Pt denies SOB, abd pain, CP, N/V/C/D, and vision changes  Objective:   No results found.  Recent Labs    04/21/23 0712  WBC 6.9  HGB 11.0*  HCT 34.8*  PLT 167   Recent Labs    04/21/23 0712  NA 140  K 4.5  CL 109  CO2 24  GLUCOSE 86  BUN 21  CREATININE 1.38*  CALCIUM 8.6*    Intake/Output Summary (Last 24 hours) at 04/23/2023 1701 Last data filed at 04/23/2023 0900 Gross per 24 hour  Intake 840 ml  Output --  Net 840 ml     Pressure Injury 04/21/23 Coccyx Mid Stage 2 -  Partial thickness loss of dermis presenting as a shallow open injury with a red, pink wound bed without slough. stage 2 to coccyx with 2 small areas close to merging into 1 area (Active)  04/21/23 1754  Location: Coccyx  Location Orientation: Mid  Staging: Stage 2 -  Partial thickness loss of dermis presenting as a shallow open injury with a red, pink wound bed without slough.  Wound Description (Comments): stage 2 to coccyx with 2 small areas close to merging into 1 area  Present on Admission: Yes    Physical Exam: Vital Signs Blood pressure (!) 116/53, pulse 66, temperature 97.7 F (36.5 C), temperature source Oral, resp. rate 16, height 5\' 3"  (1.6 m), weight 49.1 kg, SpO2 98%.   General: awake, alert, appropriate, family in room; NAD HENT: conjugate gaze; oropharynx moist CV: regular rate; no JVD Pulmonary: CTA B/L; no W/R/R- good air movement- very clear GI: soft, NT, ND, (+)BS Psychiatric: appropriate Neurological: Ox3- perseveration on fluid pills Extremities- absolutely no LE edema  Neurological: Ox3- but perseverating on fluid pill Extremities- no LE edema at all- not even trace Skin: C/D/I. No apparent  lesions.  IV intact. -Slight MASD on coccyx  MSK:      Right upper extremity digits 3-4 will crack and sublux slightly at the MCPs when extended to neutral.             Otherwise, full active range of motion       Neurologic exam:  Cognition: AAO to person, place, time and event.  Language: Fluent, No substitutions or neoglisms. No dysarthria. Names 3/3 objects correctly.  Memory: Recalls 2/3 objects at 5 minutes. No apparent deficits  Insight: Good insight into current condition.  Mood: Pleasant affect, appropriate mood.  Sensation: To light touch intact in BL UEs and LEs  Reflexes: 2+ in BL UE and LEs. Negative Hoffman's and babinski signs bilaterally.  CN: 2-12 grossly intact.  Coordination: No ataxia on FTN, HTS bilaterally.  During exam, will intermittently go into clonus like shaking of bilateral lower extremities--Per family, she does this when anxious, is baseline.   Spasticity: MAS 1 in right finger flexors, plantar flexors      Strength:  RUE: 5/5 SA, 5/5 EF, 5/5 EE, 4/5 WE, 4/5 FF, 4/5 FA                LUE:  5/5 SA, 5/5 EF, 5/5 EE, 5/5 WE, 5/5 FF, 5/5 FA                RLE: 5/5 HF, 5/5 KE, 4/5  DF, 4/5  EHL, 4/5  PF                 LLE:  5/5 HF, 5/5 KE, 5/5  DF, 5/5  EHL, 5/5  PF       Assessment/Plan: 1. Functional deficits which require 3+ hours per day of interdisciplinary therapy in a comprehensive inpatient rehab setting. Physiatrist is providing close team supervision and 24 hour management of active medical problems listed below. Physiatrist and rehab team continue to assess barriers to discharge/monitor patient progress toward functional and medical goals  Care Tool:  Bathing    Body parts bathed by patient: Right arm, Left arm, Chest, Abdomen, Front perineal area, Right upper leg, Left upper leg, Face   Body parts bathed by helper: Right lower leg, Left lower leg, Buttocks     Bathing assist Assist Level: Moderate Assistance - Patient 50 -  74%     Upper Body Dressing/Undressing Upper body dressing   What is the patient wearing?: Pull over shirt    Upper body assist Assist Level: Moderate Assistance - Patient 50 - 74%    Lower Body Dressing/Undressing Lower body dressing      What is the patient wearing?: Pants, Incontinence brief     Lower body assist Assist for lower body dressing: Maximal Assistance - Patient 25 - 49%     Toileting Toileting    Toileting assist Assist for toileting: Moderate Assistance - Patient 50 - 74%     Transfers Chair/bed transfer  Transfers assist     Chair/bed transfer assist level: Minimal Assistance - Patient > 75%     Locomotion Ambulation   Ambulation assist      Assist level: Minimal Assistance - Patient > 75% Assistive device: Walker-rolling Max distance: 50'   Walk 10 feet activity   Assist     Assist level: Minimal Assistance - Patient > 75% Assistive device: Walker-rolling   Walk 50 feet activity   Assist    Assist level: Minimal Assistance - Patient > 75% Assistive device: Walker-rolling    Walk 150 feet activity   Assist Walk 150 feet activity did not occur: Safety/medical concerns (fatigue)         Walk 10 feet on uneven surface  activity   Assist     Assist level: Minimal Assistance - Patient > 75%     Wheelchair     Assist Is the patient using a wheelchair?: Yes Type of Wheelchair: Manual    Wheelchair assist level: Dependent - Patient 0% Max wheelchair distance: 150'    Wheelchair 50 feet with 2 turns activity    Assist        Assist Level: Dependent - Patient 0%   Wheelchair 150 feet activity     Assist      Assist Level: Dependent - Patient 0%   Blood pressure (!) 116/53, pulse 66, temperature 97.7 F (36.5 C), temperature source Oral, resp. rate 16, height 5\' 3"  (1.6 m), weight 49.1 kg, SpO2 98%.   Medical Problem List and Plan: 1. Functional deficits secondary to right MCA and  cerebellar infarct right M1  and ACA occlusion status post IR thrombectomy with TICI3 and TICI2c reperfusion respectively, embolic secondary to A-fib even on Xarelto.             -patient may shower             -ELOS/Goals: 7 to 10 days, supervision PT/OT/SLP             -Stable to admit to inpatient rehab   Con't CIR 2.  Antithrombotics: -DVT/anticoagulation:  Pharmaceutical: Eliquis             -antiplatelet therapy: N/A 3. Pain Management: Tylenol as needed 4. Mood/Behavior/Sleep: Provide emotional support             -antipsychotic agents: N/A             -Note, as baseline, she will going to shakes in her upper or lower extremities when she is "anxious"; appears like myoclonus   5. Neuropsych/cognition: This patient is ?capable of making decisions on her own behalf. 6. Skin/Wound Care: Routine skin checks 7. Fluids/Electrolytes/Nutrition: Routine in and outs with follow-up chemistries 8.  Hyperlipidemia.  Zetia 9.  Hypothyroidism.  Synthroid 10.  GERD.  Protonix 11.  CKD stage IIIa.  Follow-up chemistries  3/8- Cr stable at 1.38 12.  Atrial fibrillation/SVT/pacemaker.  Cardiac rate controlled.  Continue Eliquis. 13.  Recent COVID infection.  Patient remains afebrile 14.  Diastolic congestive heart failure.  Monitor for any signs of fluid overload  3/8- will order daily weights as well as Lasix 10 mg daily- pt insiistent on Lasix daily- will lower dose to 10 mg from 20 mg- since wasn't clear if taking at home all the time and concerned about Cr.   3/9- will make sure check BMP in AM 15.  History of prior stroke.  Tone and weakness in right upper and lower extremity.      LOS: 2 days A FACE TO FACE EVALUATION WAS PERFORMED  Byard Carranza 04/23/2023, 5:01 PM

## 2023-04-23 NOTE — Plan of Care (Signed)
  Problem: RH BOWEL ELIMINATION Goal: RH STG MANAGE BOWEL WITH ASSISTANCE Description: STG Manage Bowel with toileting Assistance. Outcome: Progressing   Problem: RH SAFETY Goal: RH STG ADHERE TO SAFETY PRECAUTIONS W/ASSISTANCE/DEVICE Description: STG Adhere to Safety Precautions With cues Assistance/Device. Outcome: Progressing   Problem: RH PAIN MANAGEMENT Goal: RH STG PAIN MANAGED AT OR BELOW PT'S PAIN GOAL Description: < 4 with prns Outcome: Progressing   Problem: RH KNOWLEDGE DEFICIT Goal: RH STG INCREASE KNOWLEDGE OF HYPERTENSION Description: Patient and dtr will be able to manage HTN using educational resources for medications and dietary modification independently Outcome: Progressing Goal: RH STG INCREASE KNOWLEGDE OF HYPERLIPIDEMIA Description: Patient and dtr will be able to manage HLD using educational resources for medications and dietary modification independently Outcome: Progressing Goal: RH STG INCREASE KNOWLEDGE OF STROKE PROPHYLAXIS Description: Patient and dtr will be able to manage secondary risks using educational resources for medications and dietary modification independently Outcome: Progressing

## 2023-04-24 DIAGNOSIS — I69351 Hemiplegia and hemiparesis following cerebral infarction affecting right dominant side: Secondary | ICD-10-CM

## 2023-04-24 DIAGNOSIS — I63511 Cerebral infarction due to unspecified occlusion or stenosis of right middle cerebral artery: Secondary | ICD-10-CM | POA: Diagnosis not present

## 2023-04-24 DIAGNOSIS — I5032 Chronic diastolic (congestive) heart failure: Secondary | ICD-10-CM

## 2023-04-24 DIAGNOSIS — I4811 Longstanding persistent atrial fibrillation: Secondary | ICD-10-CM

## 2023-04-24 LAB — CBC WITH DIFFERENTIAL/PLATELET
Abs Immature Granulocytes: 0.03 10*3/uL (ref 0.00–0.07)
Basophils Absolute: 0.1 10*3/uL (ref 0.0–0.1)
Basophils Relative: 1 %
Eosinophils Absolute: 0.2 10*3/uL (ref 0.0–0.5)
Eosinophils Relative: 3 %
HCT: 34.8 % — ABNORMAL LOW (ref 36.0–46.0)
Hemoglobin: 11.3 g/dL — ABNORMAL LOW (ref 12.0–15.0)
Immature Granulocytes: 0 %
Lymphocytes Relative: 19 %
Lymphs Abs: 1.3 10*3/uL (ref 0.7–4.0)
MCH: 29 pg (ref 26.0–34.0)
MCHC: 32.5 g/dL (ref 30.0–36.0)
MCV: 89.5 fL (ref 80.0–100.0)
Monocytes Absolute: 0.7 10*3/uL (ref 0.1–1.0)
Monocytes Relative: 11 %
Neutro Abs: 4.6 10*3/uL (ref 1.7–7.7)
Neutrophils Relative %: 66 %
Platelets: 175 10*3/uL (ref 150–400)
RBC: 3.89 MIL/uL (ref 3.87–5.11)
RDW: 16.5 % — ABNORMAL HIGH (ref 11.5–15.5)
WBC: 6.9 10*3/uL (ref 4.0–10.5)
nRBC: 0 % (ref 0.0–0.2)

## 2023-04-24 LAB — COMPREHENSIVE METABOLIC PANEL
ALT: 12 U/L (ref 0–44)
AST: 16 U/L (ref 15–41)
Albumin: 3.2 g/dL — ABNORMAL LOW (ref 3.5–5.0)
Alkaline Phosphatase: 60 U/L (ref 38–126)
Anion gap: 8 (ref 5–15)
BUN: 10 mg/dL (ref 8–23)
CO2: 25 mmol/L (ref 22–32)
Calcium: 9 mg/dL (ref 8.9–10.3)
Chloride: 104 mmol/L (ref 98–111)
Creatinine, Ser: 1.14 mg/dL — ABNORMAL HIGH (ref 0.44–1.00)
GFR, Estimated: 46 mL/min — ABNORMAL LOW (ref >60)
Glucose, Bld: 90 mg/dL (ref 70–99)
Potassium: 4.5 mmol/L (ref 3.5–5.1)
Sodium: 137 mmol/L (ref 135–145)
Total Bilirubin: 1.1 mg/dL (ref 0.0–1.2)
Total Protein: 6.3 g/dL — ABNORMAL LOW (ref 6.5–8.1)

## 2023-04-24 MED ORDER — GERHARDT'S BUTT CREAM
TOPICAL_CREAM | Freq: Two times a day (BID) | CUTANEOUS | Status: DC
  Filled 2023-04-24: qty 60

## 2023-04-24 NOTE — Progress Notes (Signed)
 Speech Language Pathology Daily Session Note  Patient Details  Name: Frances Kent MRN: [id number] Date of Birth: Jul 14, 1930  Today's Date: 04/24/2023 SLP Individual Time: 0201-0301 SLP Individual Time Calculation (min): 60 min  Short Term Goals: Week 1: SLP Short Term Goal 1 (Week 1): Pt will complete medication management tasks with supervision cues. SLP Short Term Goal 2 (Week 1): Pt will tolerate regular diet and thin liquids with use of safe swallow precautions and no overt s/sx of aspiration or penetration. SLP Short Term Goal 3 (Week 1): Pt will complete selective attention tasks for 7-8 min with min A.  Skilled Therapeutic Interventions:  Patient was seen in PM to address cognitive re- training. Pt was alert and exiting bathroom with dtr assist upon SLP arrival. Pt denied pain and was agreeable for session. Pt able to verbalize recent medical hx along with PLOF indep. She reports system of taking meds including lining meds up in cabinet, routine, and awareness of medications. Given review of current medication list pt able to identify familiar medications and provided insight into changes (I.e reporting change in blood thinner from Xarelto to Eliquis this admission). Pt challenged in medication management task. Given scenarios presented verbally, she identified solutions to medication problems with 100% acc sup. She was further challenged in interpreting prescription labels given information presented verbally. She completed task with 100% acc sup. Pt attended to task appropriately for session duration with mild distraction given door remained open throughout. At conclusion of session, pt was left up in bed with call button within reach and bed alarm active. SLP to continue POC.   Pain Pain Assessment Pain Scale: 0-10 Pain Score: 0-No pain  Therapy/Group: Individual Therapy  Renaee Munda 04/24/2023, 3:56 PM

## 2023-04-24 NOTE — Progress Notes (Signed)
 Physical Therapy Session Note  Patient Details  Name: Frances Kent MRN: [id number] Date of Birth: 1930-10-01  Today's Date: 04/24/2023 PT Individual Time: 7846-9629 PT Individual Time Calculation (min): 72 min   Short Term Goals: Week 1:  PT Short Term Goal 1 (Week 1): STGs = LTGs  Skilled Therapeutic Interventions/Progress Updates:      Pt sitting EOB with her daughter present in the room. Patient denies any pain - reports poor nights rest but confirms this is baseline. Declines request for medications from MD. Floor drying after begin mopped by EVS - discussed DC planning while it dried. Daughter confirms 24/7 S/A at DC - requests a shorter LOS and is hopeful by the end of the week - primary team notified via secure chat.   Pt requetsing to toilet before leaving her room. Sit<>stand to RW with CGA. Ambulates with CGA and RW into the bathroom and able to manage pants/underwear without assist. Pt continent of bladder void - charted.   Ambulated sinkside where she completed hand hygiene in standing with supervision assist - no difficulty with motor planning and visual scanning during functional task.   Transported at wheelchair level to main rehab gym. Completed 2x24ft (seated rest break) with supervision/CGA and RW around rehab hallways. Cues for upright posture, forward gaze, and increasing R step length. Patient had no LOB or knee buckling, slow and cautious gait speed. Patient with some curvature of spine in standing as well.   Pt instructed parts of OTAGO level "B" therapeutic activity (all completed with BUE support): -knee bends (2x10) -sideways walking (2x65ft) -sit<>stand using 2 hands (2x10) *seated rest breaks b/w sets. Min cues for sequencing and technique.   Returned to her room and patient requesting to rest in bed before her next therapy session. Ambulatory transfer with CGA to bed - able to transition to supine without assist. Pt made comfortable, daughter updated at  bedside. All needs met.    Therapy Documentation Precautions:  Precautions Precautions: Fall Restrictions Weight Bearing Restrictions Per Provider Order: No General:    Therapy/Group: Individual Therapy  Orrin Brigham 04/24/2023, 7:58 AM

## 2023-04-24 NOTE — Care Management (Signed)
 [facility] Individual Statement of Services  Patient Name:  Frances Kent  Date:  04/24/2023  Welcome to the [facility].  Our goal is to provide you with an individualized program based on your diagnosis and situation, designed to meet your specific needs.  With this comprehensive rehabilitation program, you will be expected to participate in at least 3 hours of rehabilitation therapies Monday-Friday, with modified therapy programming on the weekends.  Your rehabilitation program will include the following services:  Physical Therapy (PT), Occupational Therapy (OT), Speech Therapy (ST), 24 hour per day rehabilitation nursing, Therapeutic Recreaction (TR), Psychology, Neuropsychology, Care Coordinator, Rehabilitation Medicine, Nutrition Services, and Pharmacy Services  Weekly team conferences will be held on Wednesdays to discuss your progress.  Your Inpatient Rehabilitation Care Coordinator will talk with you frequently to get your input and to update you on team discussions.  Team conferences with you and your family in attendance may also be held.  Expected length of stay: 7-10 days    Overall anticipated outcome: Supervision  Depending on your progress and recovery, your program may change. Your Inpatient Rehabilitation Care Coordinator will coordinate services and will keep you informed of any changes. Your Inpatient Rehabilitation Care Coordinator's name and contact numbers are listed  below.  The following services may also be recommended but are not provided by the [facility]:  Driving Evaluations Home Health Rehabiltiation Services Outpatient Rehabilitation Services Vocational Rehabilitation   Arrangements will be made to provide these services after discharge if needed.  Arrangements include referral to agencies that provide these services.  Your insurance has been verified to be:  [employer]  Your primary  doctor is:  Duncan Dull  Pertinent information will be shared with your doctor and your insurance company.  Inpatient Rehabilitation Care Coordinator:  Susie Cassette [phone] or [phone]  Information discussed with and copy given to patient by: Gretchen Short, 04/24/2023, 10:07 AM

## 2023-04-24 NOTE — Addendum Note (Signed)
 Addended by: Sherlene Shams on: 04/24/2023 08:05 PM   Modules accepted: Orders

## 2023-04-24 NOTE — Plan of Care (Signed)
  Problem: RH Swallowing Goal: LTG Patient will consume least restrictive diet using compensatory strategies with assistance (SLP) Description: LTG:  Patient will consume least restrictive diet using compensatory strategies with supervision assistance (SLP) Flowsheets (Taken 04/24/2023 1602) LTG: Pt Patient will consume least restrictive diet using compensatory strategies with assistance of (SLP): Supervision   Problem: RH Attention Goal: LTG Patient will demonstrate this level of attention during functional activites (SLP) Description: LTG:  Patient will will demonstrate selective attention during functional activites (SLP) with mod I Flowsheets (Taken 04/24/2023 1602) LTG: Patient will demonstrate this level of attention during cognitive/linguistic activities with assistance of (SLP): Modified Independent

## 2023-04-24 NOTE — Progress Notes (Signed)
 Occupational Therapy Session Note  Patient Details  Name: Frances Kent MRN: [id number] Date of Birth: [birth year]  Today's Date: 04/24/2023 OT Individual Time: 1045-1200 OT Individual Time Calculation (min): 75 min    Short Term Goals: Week 1:  OT Short Term Goal 1 (Week 1): Pt will be able to ambulate to toilet with RW with light CGA to demonstrate improved activity tolerance and L side strength. OT Short Term Goal 2 (Week 1): Pt will don shirt with supervision. OT Short Term Goal 3 (Week 1): Pt will be able to shower using shower seat demonstrating improved activity tolerance. OT Short Term Goal 4 (Week 1): Pt will be able to actively lift LUE to 45 degrees to increase ease of UE dressing.  Skilled Therapeutic Interventions/Progress Updates:    Pt received in bed with her daughter [name] present. Pt and daughter expressed desire to discharge to home as soon as possible as the family will have 24/7 supervision provided for pt and pt is feeling anxious to leave.  Suggested that pt and her daughter practice all of the transfers that she will need to do at home to see how pt is doing and how daughter is feeling with supervising her.   Pt completed: -Supine to sit on EOB without use of bed features -sit to stands from bed,  toilet, shower bench and recliner with supervision -ambulating to and from bathroom with RW with close supervision to practice toilet and shower transfers - stand pivots with RW to L supervision and to R with min cues for pt to not let her R foot "drag" during a turn.  Her R side weakness if from a CVA several years ago.  During self care of LB toileting, bathing and dressing, her daughter initially jumped in to help her mom with starting her pants over her feet.  "We always help her with that".   But, pt was fully capable of doffing and donning her pull ups and pants and expressed that she likes being independent.  Pt demonstrated to daughter she was able to do it.   Recommend family supervise and only step in to physically assist if pt is struggling with a task.  Pt then ambulated in hallway with RW for 75 ft with close supervision.    Pt sat in recliner to engage in L shoulder AROM and strengthening exercises. Used UE ranger for gravity supported shoulder circles,  used dowel bar to lift to 40 degrees and then active assist to lift to 90 degrees. Pt participates well.  She opted to rest in recliner.  Resting with alarm belt on and all needs met.   Because pt is close to meeting therapy goals, she may be ready for an early discharge.  Discussing with team.  Therapy Documentation Precautions:  Precautions Precautions: Fall Restrictions Weight Bearing Restrictions Per Provider Order: No   Pain: no c/o pain    ADL: ADL Eating: Set up Grooming: Setup Upper Body Bathing: Supervision/safety Where Assessed-Upper Body Bathing: Edge of bed Lower Body Bathing: Supervision/safety Where Assessed-Lower Body Bathing: Edge of bed Upper Body Dressing: Supervision/safety Where Assessed-Upper Body Dressing: Edge of bed Lower Body Dressing: Supervision/safety Where Assessed-Lower Body Dressing: Edge of bed Toileting: Supervision/safety Where Assessed-Toileting: Teacher, adult education: Close supervision Toilet Transfer Method: Proofreader: Acupuncturist: Close supervision (simulated as pt did not want to shower today) Film/video editor Method: Designer, industrial/product: Shower seat with back  Therapy/Group: Individual Therapy  Stepheny Canal  04/24/2023, 12:32 PM

## 2023-04-24 NOTE — IPOC Note (Signed)
 Overall Plan of Care [facility]) Patient Details Name: Frances Kent MRN: [id number] DOB: 1931-01-02  Admitting Diagnosis: Right middle cerebral artery stroke [facility])  Hospital Problems: Principal Problem:   Right middle cerebral artery stroke [facility])     Functional Problem List: Nursing Bladder, Bowel, Endurance, Medication Management, Safety, Skin Integrity, Pain  PT Balance, Endurance, Motor, Safety  OT Balance, Endurance, Motor  SLP    TR         Basic ADL's: OT Bathing, Dressing, Toileting     Advanced  ADL's: OT       Transfers: PT Bed Mobility, Bed to Chair, Car, Occupational psychologist, Research scientist (life sciences): PT Ambulation, Stairs     Additional Impairments: OT Fuctional Use of Upper Extremity  SLP Swallowing, Social Cognition      TR      Anticipated Outcomes Item Anticipated Outcome  Self Feeding independent  Swallowing  independent   Basic self-care  supervision  Toileting  supervision   Bathroom Transfers supervision  Bowel/Bladder  manage bowel w mod I assist and bladder w toileting  Transfers  Supervision  Locomotion  Supervision  Communication     Cognition  Mod I  Pain  < 4 with prns  Safety/Judgment  manage w cues   Therapy Plan: PT Intensity: Minimum of 1-2 x/day ,45 to 90 minutes PT Frequency: 5 out of 7 days PT Duration Estimated Length of Stay: 7-10 Days OT Intensity: Minimum of 1-2 x/day, 45 to 90 minutes OT Frequency: 5 out of 7 days OT Duration/Estimated Length of Stay: 7-10 days SLP Intensity: Minumum of 1-2 x/day, 30 to 90 minutes SLP Frequency: 3 to 5 out of 7 days SLP Duration/Estimated Length of Stay: 7-10 days   Team Interventions: Nursing Interventions Patient/Family Education, Bladder Management, Bowel Management, Disease Management/Prevention, Pain Management, Medication Management, Skin Care/Wound Management, Discharge Planning  PT interventions Ambulation/gait training, Community reintegration, DME/adaptive  equipment instruction, Neuromuscular re-education, Psychosocial support, Stair training, UE/LE Strength taining/ROM, Warden/ranger, Discharge planning, Pain management, Skin care/wound management, Therapeutic Activities, UE/LE Coordination activities, Cognitive remediation/compensation, Disease management/prevention, Functional mobility training, Patient/family education, Therapeutic Exercise, Splinting/orthotics  OT Interventions Balance/vestibular training, Functional mobility training, DME/adaptive equipment instruction, Discharge planning, Patient/family education, Psychosocial support, Self Care/advanced ADL retraining, Therapeutic Activities, Therapeutic Exercise, UE/LE Strength taining/ROM, UE/LE Coordination activities  SLP Interventions Speech/Language facilitation, Therapeutic Activities, Dysphagia/aspiration precaution training  TR Interventions    SW/CM Interventions     Barriers to Discharge MD  Medical stability  Nursing Lack of/limited family support 1 level 1 ste solo; meals provided by dtr  PT      OT      SLP      SW       Team Discharge Planning: Destination: PT-Home ,OT- Home , SLP-Home Projected Follow-up: PT-24 hour supervision/assistance, OT-  Home health OT, SLP-Home Health SLP Projected Equipment Needs: PT-To be determined, OT- None recommended by OT, SLP-None recommended by SLP Equipment Details: PT- , OT-pt has a shower chair and BSC over toilet at home Patient/family involved in discharge planning: PT- Patient,  OT-Patient, Family member/caregiver, SLP-Patient, Family member/caregiver  MD ELOS: 5-7d Medical Rehab Prognosis:  Good Assessment: The patient has been admitted for CIR therapies with the diagnosis of RIght MCA CVA. The team will be addressing functional mobility, strength, stamina, balance, safety, adaptive techniques and equipment, self-care, bowel and bladder mgt, patient and caregiver education, HR management , monitor for CHF  exacerbation . Goals have been set at Sup. Anticipated  discharge destination is home with family assist.        See Team Conference Notes for weekly updates to the plan of care

## 2023-04-24 NOTE — Progress Notes (Signed)
 Patient ID: Frances Kent, female   DOB: [birth year], 89 y.o.   MRN: [id number] Met with the patient and daughter to review current medical situation, rehab process, team conference and plan of care.  Patient resting but in good spirits post CVAs. Reported not sleeping well at HS but napping during the day; declined medication.  Reviewed secondary risk management including HTN, HLD (LDL 143/Trig 69() on Zetia (new).  HF (BNP 1707 on admit); dtr. confirms daily weight checks PTA and monitoring salt intake with pacemaker, A-fib on Eliquis and CKD.  Constipation addressed. Active My Chart account. Continue to follow along to address educational needs to facilitate preparation for discharge. Pamelia Hoit

## 2023-04-24 NOTE — Progress Notes (Signed)
 PROGRESS NOTE   Subjective/Complaints:  Pt and daughter in room , no SOB or leg swelling Pt states she has pacemaker, per chart has Micra device, implanted in Right ventricle for AV dysfunction  Discussed pt off metoprolol  ROS:   Pt denies SOB, abd pain, CP, N/V/C/D, and vision changes  Objective:   No results found.  Recent Labs    04/24/23 0531  WBC 6.9  HGB 11.3*  HCT 34.8*  PLT 175   No results for input(s): "NA", "K", "CL", "CO2", "GLUCOSE", "BUN", "CREATININE", "CALCIUM" in the last 72 hours.   Intake/Output Summary (Last 24 hours) at 04/24/2023 0959 Last data filed at 04/24/2023 7829 Gross per 24 hour  Intake 1200 ml  Output --  Net 1200 ml     Pressure Injury 04/21/23 Coccyx Mid Stage 2 -  Partial thickness loss of dermis presenting as a shallow open injury with a red, pink wound bed without slough. stage 2 to coccyx with 2 small areas close to merging into 1 area (Active)  04/21/23 1754  Location: Coccyx  Location Orientation: Mid  Staging: Stage 2 -  Partial thickness loss of dermis presenting as a shallow open injury with a red, pink wound bed without slough.  Wound Description (Comments): stage 2 to coccyx with 2 small areas close to merging into 1 area  Present on Admission: Yes    Physical Exam: Vital Signs Blood pressure (!) 140/79, pulse 71, temperature 98.5 F (36.9 C), resp. rate 18, height 5\' 3"  (1.6 m), weight 45.3 kg, SpO2 98%.  General: No acute distress Mood and affect are appropriate Heart: Regular rate and rhythm no rubs murmurs or extra sounds No pacemake battery in chest  Lungs: Clear to auscultation, breathing unlabored, no rales or wheezes Abdomen: Positive bowel sounds, soft nontender to palpation, nondistended Extremities: No clubbing, cyanosis, or edema Skin: No evidence of breakdown, no evidence of rash   Neurological: Ox3- but perseverating on fluid pill Extremities-  no LE edema at all- not even trace Skin: C/D/I. No apparent lesions.  IV intact. -Slight MASD on coccyx  MSK:      Right upper extremity digits 3-4 will crack and sublux slightly at the MCPs when extended to neutral.             Otherwise, full active range of motion       Neurologic exam:  Cognition: AAO to person, place, time and event.  Language: Fluent, No substitutions or neoglisms. No dysarthria or aphasia noted.  No field cut  Insight: Good insight into current condition.  Mood: Pleasant affect, appropriate mood.  Sensation: To light touch intact in BL UEs and LEs  Reflexes: 2+ in BL UE and LEs. Negative Hoffman's and babinski signs bilaterally.  Right UE 4/5, RLE 4/5 , left size 5/5      Assessment/Plan: 1. Functional deficits which require 3+ hours per day of interdisciplinary therapy in a comprehensive inpatient rehab setting. Physiatrist is providing close team supervision and 24 hour management of active medical problems listed below. Physiatrist and rehab team continue to assess barriers to discharge/monitor patient progress toward functional and medical goals  Care Tool:  Bathing  Body parts bathed by patient: Right arm, Left arm, Chest, Abdomen, Front perineal area, Right upper leg, Left upper leg, Face   Body parts bathed by helper: Right lower leg, Left lower leg, Buttocks     Bathing assist Assist Level: Moderate Assistance - Patient 50 - 74%     Upper Body Dressing/Undressing Upper body dressing   What is the patient wearing?: Pull over shirt    Upper body assist Assist Level: Moderate Assistance - Patient 50 - 74%    Lower Body Dressing/Undressing Lower body dressing      What is the patient wearing?: Pants, Incontinence brief     Lower body assist Assist for lower body dressing: Maximal Assistance - Patient 25 - 49%     Toileting Toileting    Toileting assist Assist for toileting: Moderate Assistance - Patient 50 - 74%      Transfers Chair/bed transfer  Transfers assist     Chair/bed transfer assist level: Minimal Assistance - Patient > 75%     Locomotion Ambulation   Ambulation assist      Assist level: Minimal Assistance - Patient > 75% Assistive device: Walker-rolling Max distance: 50'   Walk 10 feet activity   Assist     Assist level: Minimal Assistance - Patient > 75% Assistive device: Walker-rolling   Walk 50 feet activity   Assist    Assist level: Minimal Assistance - Patient > 75% Assistive device: Walker-rolling    Walk 150 feet activity   Assist Walk 150 feet activity did not occur: Safety/medical concerns (fatigue)         Walk 10 feet on uneven surface  activity   Assist     Assist level: Minimal Assistance - Patient > 75%     Wheelchair     Assist Is the patient using a wheelchair?: Yes Type of Wheelchair: Manual    Wheelchair assist level: Dependent - Patient 0% Max wheelchair distance: 150'    Wheelchair 50 feet with 2 turns activity    Assist        Assist Level: Dependent - Patient 0%   Wheelchair 150 feet activity     Assist      Assist Level: Dependent - Patient 0%   Blood pressure (!) 140/79, pulse 71, temperature 98.5 F (36.9 C), resp. rate 18, height 5\' 3"  (1.6 m), weight 45.3 kg, SpO2 98%.   Medical Problem List and Plan: 1. Functional deficits secondary to right MCA and cerebellar infarct right M1 and ACA occlusion status post IR thrombectomy with TICI3 and TICI2c reperfusion respectively, embolic secondary to A-fib even on Xarelto.             -patient may shower             -ELOS/Goals: 7 to 10 days, supervision PT/OT/SLP             -Stable to admit to inpatient rehab   Con't CIR 2.  Antithrombotics: -DVT/anticoagulation:  Pharmaceutical: Eliquis             -antiplatelet therapy: N/A 3. Pain Management: Tylenol as needed 4. Mood/Behavior/Sleep: Provide emotional support             -antipsychotic  agents: N/A             -Note, as baseline, she will going to shakes in her upper or lower extremities when she is "anxious"; appears like myoclonus   5. Neuropsych/cognition: This patient is ?capable of making decisions on her own  behalf. 6. Skin/Wound Care: Routine skin checks 7. Fluids/Electrolytes/Nutrition: Routine in and outs with follow-up chemistries 8.  Hyperlipidemia.  Zetia 9.  Hypothyroidism.  Synthroid 10.  GERD.  Protonix 11.  CKD stage IIIa.  Follow-up chemistries      Latest Ref Rng & Units 04/21/2023    7:12 AM 04/20/2023    6:04 AM 04/19/2023    6:06 AM  BMP  Glucose 70 - 99 mg/dL 86  99    BUN 8 - 23 mg/dL 21  16    Creatinine 1.61 - 1.00 mg/dL 0.96  0.45    Sodium 409 - 145 mmol/L 140  139  137   Potassium 3.5 - 5.1 mmol/L 4.5  5.7  3.5   Chloride 98 - 111 mmol/L 109  111    CO2 22 - 32 mmol/L 24  24    Calcium 8.9 - 10.3 mg/dL 8.6  8.7      12.  Atrial fibrillation/SVT/pacemaker.  Cardiac rate controlled.  Continue Eliquis.off metroprolol, cont to monitor HR , BP mildly elevated this am but generally in normal range 13.  Recent COVID infection.  Patient remains afebrile 14.  Diastolic congestive heart failure.  Monitor for any signs of fluid overload  3/8- will order daily weights as well as Lasix 10 mg daily- pt insiistent on Lasix daily- will lower dose to 10 mg from 20 mg- since wasn't clear if taking at home all the time and concerned about Cr.   Fluid intake over the weekend to  Filed Weights   04/23/23 0500 04/24/23 0357  Weight: 49.1 kg 45.3 kg   Weights down today , monitor over time Monitor for peripheral edema and rales on exam   15.  History of prior stroke Left basal ganglia infarct Sept 2022 Tone and weakness in right upper and lower extremity.   LOS: 3 days A FACE TO FACE EVALUATION WAS PERFORMED  Erick Colace 04/24/2023, 9:59 AM

## 2023-04-24 NOTE — Progress Notes (Signed)
 Inpatient Rehabilitation  Patient information reviewed and entered into eRehab system by [facility]. Karen Kays., CCC/SLP, PPS Coordinator.  Information including medical coding, functional ability and quality indicators will be reviewed and updated through discharge.

## 2023-04-25 DIAGNOSIS — I4811 Longstanding persistent atrial fibrillation: Secondary | ICD-10-CM | POA: Diagnosis not present

## 2023-04-25 DIAGNOSIS — I5032 Chronic diastolic (congestive) heart failure: Secondary | ICD-10-CM | POA: Diagnosis not present

## 2023-04-25 DIAGNOSIS — I63511 Cerebral infarction due to unspecified occlusion or stenosis of right middle cerebral artery: Secondary | ICD-10-CM | POA: Diagnosis not present

## 2023-04-25 DIAGNOSIS — I69351 Hemiplegia and hemiparesis following cerebral infarction affecting right dominant side: Secondary | ICD-10-CM | POA: Diagnosis not present

## 2023-04-25 MED ORDER — METOPROLOL TARTRATE 12.5 MG HALF TABLET
12.5000 mg | ORAL_TABLET | Freq: Two times a day (BID) | ORAL | Status: DC
  Administered 2023-04-25 – 2023-04-26 (×3): 12.5 mg via ORAL
  Filled 2023-04-25 (×3): qty 1

## 2023-04-25 NOTE — Plan of Care (Signed)
  Problem: RH BOWEL ELIMINATION Goal: RH STG MANAGE BOWEL WITH ASSISTANCE Description: STG Manage Bowel with toileting Assistance. Outcome: Progressing   Problem: RH SKIN INTEGRITY Goal: RH STG SKIN FREE OF INFECTION/BREAKDOWN Description: Manage skin w min assist Outcome: Progressing   Problem: RH SKIN INTEGRITY Goal: RH STG MAINTAIN SKIN INTEGRITY WITH ASSISTANCE Description: STG Maintain Skin Integrity With min Assistance. Outcome: Progressing   Problem: RH SAFETY Goal: RH STG ADHERE TO SAFETY PRECAUTIONS W/ASSISTANCE/DEVICE Description: STG Adhere to Safety Precautions With cues Assistance/Device. Outcome: Progressing

## 2023-04-25 NOTE — Progress Notes (Signed)
 Occupational Therapy Session Note  Patient Details  Name: Frances Kent MRN: [id number] Date of Birth: 06-03-1930  Today's Date: 04/25/2023 OT Individual Time: 1045-1200 OT Individual Time Calculation (min): 75 min    Short Term Goals: Week 1:  OT Short Term Goal 1 (Week 1): Pt will be able to ambulate to toilet with RW with light CGA to demonstrate improved activity tolerance and L side strength. OT Short Term Goal 2 (Week 1): Pt will don shirt with supervision. OT Short Term Goal 3 (Week 1): Pt will be able to shower using shower seat demonstrating improved activity tolerance. OT Short Term Goal 4 (Week 1): Pt will be able to actively lift LUE to 45 degrees to increase ease of UE dressing.  Skilled Therapeutic Interventions/Progress Updates:    Pt received in room with her daughter [name] present.  Phyllis participated in [place] education yesterday and today.  Offered pt a shower but she opts to wait until home due to cold bathroom temps.     Reviewed and practiced multiple chair transfers in room, ambulation in hallway, to bathroom with [name] observing and demonstrating.  Per daughter, pt has  a history of a slight R foot drag due to R hemiparesis from CVA several years ago.  Place a theraband across lower part of RW for pt to use as a visual cue to ensure she is taking a large enough step forward with her R foot.  Pt responds very well to this cue and only needed occasional cues to attend to her R leg.  Told her dtr to remove band when she leaves tomorrow and place on her home RW.   Pt uses a lift chair at home but was able to accomplish 3 sit to stands from low recliner in her room and 2 sit to stands from low pull out bed in room with supervision.  Encouraged her NOT to use the lift function of her chair at home to keep her LEs strong.  Reviewed with pt and dtr to allow pt to do as much for herself as she only needs supervision with bathing and dressing and is quite strong and flexible.    Pt ambulated with RW to day room sat on mat to rest and then returned to her room.  For LUE shoulder AROM, pt worked on standing at table placed at counter height and moving towel on table for arm circles.  Pt has some shoulder pain if she tries to lift arm over 50 degrees. Encouraged her to work on sh mobility with gravity eliminated. Pt did well with the exercise and encouraged her to do this at her kitchen counter at home.    Pt has met her goals and pt and family are eager to d/c home. Team has agreed that pt could leave tomorrow.   Daughter cleared to walk pt to the bathroom and in the room on the safety plan.   Therapy Documentation Precautions:  Precautions Precautions: Fall Restrictions Weight Bearing Restrictions Per Provider Order: No   Pain:  No c/o pain  ADL: ADL Eating: Set up Grooming: Setup Upper Body Bathing: Supervision/safety Where Assessed-Upper Body Bathing: Edge of bed Lower Body Bathing: Supervision/safety Where Assessed-Lower Body Bathing: Edge of bed Upper Body Dressing: Supervision/safety Where Assessed-Upper Body Dressing: Edge of bed Lower Body Dressing: Supervision/safety Where Assessed-Lower Body Dressing: Edge of bed Toileting: Supervision/safety Where Assessed-Toileting: Teacher, adult education: Close supervision Toilet Transfer Method: Proofreader: Acupuncturist: Close supervision (simulated  as pt did not want to shower today) Film/video editor Method: Designer, industrial/product: Shower seat with back   Therapy/Group: Individual Therapy  Khamryn Calderone 04/25/2023, 8:57 AM

## 2023-04-25 NOTE — Progress Notes (Signed)
 Occupational Therapy Discharge Summary  Patient Details  Name: Frances Kent MRN: [id number] Date of Birth: 01/10/1931  Date of Discharge from OT service:April 25, 2023     Patient has met 11 of 11 long term goals due to improved activity tolerance, improved balance, postural control, ability to compensate for deficits, functional use of  LEFT upper and LEFT lower extremity, improved awareness, and improved coordination.  Patient to discharge at overall Supervision level.  Patient's care partner is independent to provide the necessary physical assistance at discharge.    Reasons goals not met: n/a  Recommendation:  No further OT services recommended at this time.   Equipment: No equipment provided - pt has needed DME at home  Reasons for discharge: treatment goals met  Patient/family agrees with progress made and goals achieved: Yes  OT Discharge Precautions/Restrictions  Precautions Precautions: Fall Restrictions Weight Bearing Restrictions Per Provider Order: No   ADL ADL Eating: Independent Grooming: Supervision/safety Where Assessed-Grooming: Standing at sink Upper Body Bathing: Supervision/safety Where Assessed-Upper Body Bathing: Edge of bed Lower Body Bathing: Supervision/safety Where Assessed-Lower Body Bathing: Edge of bed Upper Body Dressing: Supervision/safety Where Assessed-Upper Body Dressing: Edge of bed Lower Body Dressing: Supervision/safety Where Assessed-Lower Body Dressing: Edge of bed Toileting: Supervision/safety Where Assessed-Toileting: Teacher, adult education: Close supervision Toilet Transfer Method: Proofreader: Acupuncturist: Close supervision (simulated as pt did not want to shower today) Film/video editor Method: Designer, industrial/product: Information systems manager with back Vision Baseline Vision/History: 6 Macular Degeneration;1 Wears glasses Patient Visual Report: No change from  baseline Vision Assessment?: Wears glasses for reading Perception  Perception: Within Functional Limits Praxis Praxis: WFL Cognition Cognition Overall Cognitive Status: History of cognitive impairments - at baseline Arousal/Alertness: Awake/alert Orientation Level: Person;Place;Situation Person: Oriented Place: Oriented Situation: Oriented Memory: Impaired Memory Impairment: Retrieval deficit Awareness: Appears intact Problem Solving: Appears intact Problem Solving Impairment: Verbal complex Safety/Judgment: Appears intact Brief Interview for Mental Status (BIMS) Repetition of Three Words (First Attempt): 3 Temporal Orientation: Year: Correct Temporal Orientation: Month: Accurate within 5 days Temporal Orientation: Day: Correct Recall: "Sock": Yes, no cue required Recall: "Blue": Yes, no cue required Recall: "Bed": Yes, after cueing ("a piece of furniture") BIMS Summary Score: 14 Sensation Sensation Light Touch: Appears Intact Hot/Cold: Appears Intact Proprioception: Appears Intact Coordination Gross Motor Movements are Fluid and Coordinated: No Fine Motor Movements are Fluid and Coordinated: No Coordination and Movement Description: pt is now moving L side fluidly and functionally, but due to L shoulder AROM restrictions her LUE is not able to reach overhead.   No resting tremor observed during OT sessions the last 2 days. Finger Nose Finger Test: accurate but slow due to limited L shoulder AROM Motor  Motor Motor - Discharge Observations: L hemiplegia improved and pt has functional L arm distal movement and LLE movement.  R weakness from CVA 3 years ago. Mobility  Transfers Sit to Stand: Supervision/Verbal cueing Stand to Sit: Supervision/Verbal cueing  Trunk/Postural Assessment  Postural Control Postural Control:  (slightly delayed)  Balance Static Sitting Balance Static Sitting - Level of Assistance: 7: Independent Dynamic Sitting Balance Dynamic Sitting -  Level of Assistance: 5: Stand by assistance Static Standing Balance Static Standing - Level of Assistance: 5: Stand by assistance Dynamic Standing Balance Dynamic Standing - Level of Assistance: 5: Stand by assistance (SBA with support from RW during basic ADLs) Extremity/Trunk Assessment RUE Assessment Active Range of Motion (AROM) Comments: 100 degrees of shoulder flexion, General Strength Comments:  grasp fair with partial claw hand contracture but pt able to use hand during self care ADLs LUE Assessment Passive Range of Motion (PROM) Comments: 100 sh flexion Active Range of Motion (AROM) Comments: 45 degrees sh flexion, has full AROM of elbow and hand General Strength Comments: 2- shoulder, 4- elbow,  4+/5 hand grip strength   Marshal Schrecengost 04/25/2023, 1:21 PM

## 2023-04-25 NOTE — Progress Notes (Signed)
 Physical Therapy Discharge Summary  Patient Details  Name: Frances Kent MRN: [id number] Date of Birth: 1930-05-26  Date of Discharge from PT service:April 25, 2023   Patient has met {NUMBERS 0-12:18577} of {NUMBERS 0-12:18577} long term goals due to {due WU:9811914}.  Patient to discharge at [facility] level {LOA:3049010}.   Patient's care partner {care partner:[id number]} to provide the necessary {assistance:3041652} assistance at discharge.  Reasons goals not met: ***  Recommendation:  Patient will benefit from ongoing skilled PT services in {setting:3041680} to continue to advance safe functional mobility, address ongoing impairments in ***, and minimize fall risk.  Equipment: No equipment provided  Reasons for discharge: treatment goals met  Patient/family agrees with progress made and goals achieved: Yes  PT Discharge Precautions/Restrictions Precautions Precautions: Fall Restrictions Weight Bearing Restrictions Per Provider Order: No Pain Pain Assessment Pain Scale: 0-10 Pain Score: 0-No pain Pain Interference Pain Interference Pain Effect on Sleep: 1. Rarely or not at all Pain Interference with Therapy Activities: 1. Rarely or not at all Pain Interference with Day-to-Day Activities: 1. Rarely or not at all Vision/Perception  Vision - History Ability to See in Adequate Light: 0 Adequate Perception Perception: Within Functional Limits Praxis Praxis: WFL  Cognition Overall Cognitive Status: Within Functional Limits for tasks assessed Orientation Level: Oriented X4 Awareness: Appears intact Problem Solving: Appears intact Safety/Judgment: Appears intact Sensation Sensation Light Touch: Appears Intact Hot/Cold: Appears Intact Proprioception: Appears Intact Coordination Gross Motor Movements are Fluid and Coordinated: No Fine Motor Movements are Fluid and Coordinated: Not tested Coordination and Movement Description: Occasionaly foot drag on R  LE Motor  Motor Motor: Other (comment) (mild hemiparesis) Motor - Discharge Observations: L hemiplegia improved and pt has functional LLE movement.  R weakness from CVA 3 years ago.  Mobility Bed Mobility Bed Mobility: Sit to Supine;Supine to Sit Supine to Sit: Supervision/Verbal cueing Sit to Supine: Supervision/Verbal cueing Transfers Transfers: Stand Pivot Transfers;Stand to Sit;Sit to Stand Sit to Stand: Supervision/Verbal cueing Stand to Sit: Supervision/Verbal cueing Stand Pivot Transfers: Supervision/Verbal cueing Transfer (Assistive device): Rolling walker Locomotion  Gait Gait Assistance: Supervision/Verbal cueing Gait Distance (Feet): 200 Feet Assistive device: Rolling walker Gait Gait: Yes Gait Pattern: Impaired Gait Pattern: Poor foot clearance - right;Decreased stance time - left;Decreased step length - right Stairs / Additional Locomotion Stairs: Yes Stairs Assistance: Minimal Assistance - Patient > 75% Stair Management Technique: Two rails Number of Stairs: 8 Height of Stairs: 6 Ramp: Minimal Assistance - Patient >75% Curb: Supervision/Verbal cueing Wheelchair Mobility Wheelchair Mobility: No  Trunk/Postural Assessment  Cervical Assessment Cervical Assessment: Exceptions to [facility] (forward head) Thoracic Assessment Thoracic Assessment: Exceptions to [facility] (rounded shoulders) Lumbar Assessment Lumbar Assessment: Exceptions to [facility] (posterior pelvic tilt) Postural Control Postural Control: Deficits on evaluation (delayed)  Balance Balance Balance Assessed: Yes Static Sitting Balance Static Sitting - Level of Assistance: 7: Independent Dynamic Sitting Balance Dynamic Sitting - Level of Assistance: 5: Stand by assistance Static Standing Balance Static Standing - Level of Assistance: 5: Stand by assistance Dynamic Standing Balance Dynamic Standing - Level of Assistance: 5: Stand by assistance (in RW) Extremity Assessment      RLE Assessment RLE  Assessment: Exceptions to [facility] General Strength Comments: Grossly 4/5 LLE Assessment LLE Assessment: Exceptions to [facility] General Strength Comments: Grossly 4/5   Siani Utke PTA  04/25/2023, 4:31 PM

## 2023-04-25 NOTE — Progress Notes (Signed)
 Speech Language Pathology Discharge Summary  Patient Details  Name: Frances Kent MRN: [id number] Date of Birth: December 09, 1930  Date of Discharge from SLP service:April 25, 2023  Today's Date: 04/25/2023 SLP Individual Time: 6440-3474 SLP Individual Time Calculation (min): 59 min  Skilled Therapeutic Interventions: Patient was seen in am to address cognitive re- training and dysphagia management. Pt was alert and seen at bedside. Dtr present and participating intermittently throughout session. She denied pain and was agreeable for session. MD in and out and reporting pt could discharge home the following day. SLP addressed selective attention and thought organization during session. With the door left open and intermittent traffic passing throughout, pt sustained attention to a structured task for ~10 minutes with sup A. SLP also engaged pt in a thought organization task where she benefitted from min A during a structured task. SLP engaged pt in discussion of diet and swallowing function. She reports consumption for softer foods and no meats due to hx of esophageal dysphagia. She reported no current concerns with current diet. Education completed on positioning recommendations, rate, and amount. Pt and family with good awareness and comprehension of recommendations. At conclusion of session, SLP reiterated importance of general brain health recommendations including disease management, cognitive stimulation, sleep hygiene, and nutrition. Pt and her dtr demo good comprehension of recommendations. Pt left in bed with call button within reach and bed alarm active. SLP to continue POC.    Patient has met 2 of 2 long term goals.  Patient to discharge at overall Supervision level.  Reasons goals not met:     Clinical Impression/Discharge Summary: Pt has made excellent gains and has met 2 of 2 LTG's this admission due to improved attention, problem solving, and tolerance of current diet. Pt is currently an  overall sup A for cognitive tasks. She consumed regular solids and thin liquids with use of baseline compensatory strategies.due to hx of esophageal dysphagia. Pt/ family education complete and pt will discharge home with 24 hour supervision from family. No further SLP intervention warranted at this time.  Care Partner:  Caregiver Able to Provide Assistance: Yes    Recommendation:  Home Health SLP     Equipment: none   Reasons for discharge: Discharged from hospital;Treatment goals met   Patient/Family Agrees with Progress Made and Goals Achieved: Yes    Renaee Munda 04/25/2023, 12:34 PM

## 2023-04-25 NOTE — Plan of Care (Signed)
  Problem: RH Swallowing Goal: LTG Patient will consume least restrictive diet using compensatory strategies with assistance (SLP) Description: LTG:  Patient will consume least restrictive diet using compensatory strategies with supervision assistance (SLP) Outcome: Completed/Met Flowsheets (Taken 04/25/2023 1222) LTG: Pt Patient will consume least restrictive diet using compensatory strategies with assistance of (SLP): Supervision   Problem: RH Attention Goal: LTG Patient will demonstrate this level of attention during functional activites (SLP) Description: LTG:  Patient will will demonstrate selective attention during functional activites (SLP) with mod I Outcome: Completed/Met Flowsheets (Taken 04/25/2023 1222) Patient will demonstrate during cognitive/linguistic activities the attention type of: Selective Patient will demonstrate this level of attention during cognitive/linguistic activities in: Controlled LTG: Patient will demonstrate this level of attention during cognitive/linguistic activities with assistance of (SLP): Modified Independent

## 2023-04-25 NOTE — Progress Notes (Addendum)
 Patient ID: Frances Kent, female   DOB: [birth year], 89 y.o.   MRN: [id number]  Medical team confirms discharge tomorrow.   SW met with pt and [name] in room to discuss discharge to home. Preferred [facility]- [facility].   SW sent Aaron[facility] referral for PT/OT/SLP and waiting on follow-up.   0424- SW spoke with pt sister [name] on above. SW will confirm [facility]HA.  *accepted referral. SOC will be this weekend.   Cecile Sheerer, MSW, LCSW Office: [phone] Cell: [phone] Fax: [phone]

## 2023-04-25 NOTE — Progress Notes (Signed)
 Physical Therapy Session Note  Patient Details  Name: Frances Kent MRN: [id number] Date of Birth: 1930/04/19  Today's Date: 04/25/2023 PT Individual Time: 7829-5621 PT Individual Time Calculation (min): 74 min   Short Term Goals: Week 1:  PT Short Term Goal 1 (Week 1): STGs = LTGs  Skilled Therapeutic Interventions/Progress Updates:  Patient sitting EOB with daughter present on entrance to room. Patient alert and agreeable to PT session.   Patient reported feeling that she has been making great progress since evaluation. PTA, pt and daughter discussed potential home barriers (threshold to enter home, pets - dogs are outside and pt does not interact with them - flooring - reported hardwood flooring with pt and daughter stating that pt has grip socks at home. PTA encouraged continued use of grip socks or shoes while ambulating in home). PTA discussed pt to follow up with HHPT after discussing with PT that has worked with her. Pt understood that it could help with increasing balance strategies and step length/clearance with R LE as pt has been reported to drag R foot on floor when pivoting to L. Pt performed stand pivot transfers throughout session with supervision. Pt transported from room<>main gym dependently in [facility] for energy conservation. PTA discussed HEP with pt reporting that she would not do them as pt has not performed HEP's with past therapies. PTA discussed pt's day-to-day activities with pt reporting morning and evening medication (3-4 different pill bottles). PTA provided pt with functional HEP with pt ambulating from table place where pt likes to sit to medicine cabinet and grabbing one pill bottle at a time, and ambulating back to place on table. Pt to performed until all pill bottles are on table. PTA set up home environment simulation with pt ambulating in RW and performing 180* and 90* pivot turns and VC to increase step length/clearance on R LE when pivoting to avoid R foot drag to  improve step through pattern (green theraband donned on bottom of anterior RW for target cue). Pt daughter educated on all cuing and sequence for pt to keep in mind (including to avoid picking up RW when pivoting) with daughter and pt understanding. Pt agreeable to participate in functional HEP to increase activity tolerance/dynamic standing balance/improved step clearance and length, and to decrease caregiver burden as pt's daughter reported that family assists pt a little more than they "probably should." Pt performed task with supervision throughout. Pt participated in curb navigation x 2 with RW and with VC to safely maneuver RW on/off step (step up close to front of RW bar to avoid excessive anterior lean). Pt with supervision throughout. Pt and daughter with no further concerns regarding pt's d/c home at end of session.   Patient sitting EOB at end of session with brakes locked, daughter present, and all needs within reach.      Therapy Documentation Precautions:  Precautions Precautions: Fall Restrictions Weight Bearing Restrictions Per Provider Order: No  Therapy/Group: Individual Therapy  Lyrick Worland PTA 04/25/2023, 3:45 PM

## 2023-04-25 NOTE — Progress Notes (Addendum)
 PROGRESS NOTE   Subjective/Complaints:  Doing great in therapy, family has equipment, no SOB or leg swelling, discussed resumption of metoprolol   ROS:   Pt denies SOB, abd pain, CP, N/V/C/D, and vision changes  Objective:   No results found.  Recent Labs    04/24/23 0531  WBC 6.9  HGB 11.3*  HCT 34.8*  PLT 175   Recent Labs    04/24/23 1431  NA 137  K 4.5  CL 104  CO2 25  GLUCOSE 90  BUN 10  CREATININE 1.14*  CALCIUM 9.0     Intake/Output Summary (Last 24 hours) at 04/25/2023 0756 Last data filed at 04/24/2023 1841 Gross per 24 hour  Intake 480 ml  Output --  Net 480 ml     Pressure Injury 04/21/23 Coccyx Mid Stage 2 -  Partial thickness loss of dermis presenting as a shallow open injury with a red, pink wound bed without slough. stage 2 to coccyx with 2 small areas close to merging into 1 area (Active)  04/21/23 1754  Location: Coccyx  Location Orientation: Mid  Staging: Stage 2 -  Partial thickness loss of dermis presenting as a shallow open injury with a red, pink wound bed without slough.  Wound Description (Comments): stage 2 to coccyx with 2 small areas close to merging into 1 area  Present on Admission: Yes    Physical Exam: Vital Signs Blood pressure 117/60, pulse 72, temperature 97.8 F (36.6 C), temperature source Oral, resp. rate 17, height 5\' 3"  (1.6 m), weight 47.3 kg, SpO2 97%.  General: No acute distress Mood and affect are appropriate Heart: Regular rate and rhythm no rubs murmurs or extra sounds  Lungs: Clear to auscultation, breathing unlabored, no rales or wheezes Abdomen: Positive bowel sounds, soft nontender to palpation, nondistended Extremities: No clubbing, cyanosis, or edema Skin: No evidence of breakdown, no evidence of rash   Neurological: Ox3- but perseverating on fluid pill Extremities- no LE edema at all- not even trace Skin: C/D/I. No apparent lesions.  IV  intact. -Slight MASD on coccyx  MSK:      Right upper extremity digits 3-4 will crack and sublux slightly at the MCPs when extended to neutral.             Otherwise, full active range of motion       Neurologic exam:  Cognition: AAO to person, place, time and event.  Language: Fluent, No substitutions or neoglisms. No dysarthria or aphasia noted.  No field cut  Insight: Good insight into current condition.  Mood: Pleasant affect, appropriate mood.  Sensation: To light touch intact in BL UEs and LEs  Reflexes: 2+ in BL UE and LEs. Negative Hoffman's and babinski signs bilaterally.  Right UE 4/5, RLE 4/5 , left size 5/5      Assessment/Plan: 1. Functional deficits which require 3+ hours per day of interdisciplinary therapy in a comprehensive inpatient rehab setting. Physiatrist is providing close team supervision and 24 hour management of active medical problems listed below. Physiatrist and rehab team continue to assess barriers to discharge/monitor patient progress toward functional and medical goals  Care Tool:  Bathing    Body parts  bathed by patient: Right arm, Left arm, Chest, Abdomen, Front perineal area, Right upper leg, Left upper leg, Face   Body parts bathed by helper: Right lower leg, Left lower leg, Buttocks     Bathing assist Assist Level: Moderate Assistance - Patient 50 - 74%     Upper Body Dressing/Undressing Upper body dressing   What is the patient wearing?: Pull over shirt    Upper body assist Assist Level: Moderate Assistance - Patient 50 - 74%    Lower Body Dressing/Undressing Lower body dressing      What is the patient wearing?: Pants, Incontinence brief     Lower body assist Assist for lower body dressing: Maximal Assistance - Patient 25 - 49%     Toileting Toileting    Toileting assist Assist for toileting: Moderate Assistance - Patient 50 - 74%     Transfers Chair/bed transfer  Transfers assist     Chair/bed transfer assist  level: Minimal Assistance - Patient > 75%     Locomotion Ambulation   Ambulation assist      Assist level: Minimal Assistance - Patient > 75% Assistive device: Walker-rolling Max distance: 50'   Walk 10 feet activity   Assist     Assist level: Minimal Assistance - Patient > 75% Assistive device: Walker-rolling   Walk 50 feet activity   Assist    Assist level: Minimal Assistance - Patient > 75% Assistive device: Walker-rolling    Walk 150 feet activity   Assist Walk 150 feet activity did not occur: Safety/medical concerns (fatigue)         Walk 10 feet on uneven surface  activity   Assist     Assist level: Minimal Assistance - Patient > 75%     Wheelchair     Assist Is the patient using a wheelchair?: Yes Type of Wheelchair: Manual    Wheelchair assist level: Dependent - Patient 0% Max wheelchair distance: 150'    Wheelchair 50 feet with 2 turns activity    Assist        Assist Level: Dependent - Patient 0%   Wheelchair 150 feet activity     Assist      Assist Level: Dependent - Patient 0%   Blood pressure 117/60, pulse 72, temperature 97.8 F (36.6 C), temperature source Oral, resp. rate 17, height 5\' 3"  (1.6 m), weight 47.3 kg, SpO2 97%.   Medical Problem List and Plan: 1. Functional deficits secondary to right MCA and cerebellar infarct right M1 and ACA occlusion status post IR thrombectomy with TICI3 and TICI2c reperfusion respectively, embolic secondary to A-fib even on Xarelto.             -patient may shower             -ELOS/Goals: plan d/c in am    Con't CIR 2.  Antithrombotics: -DVT/anticoagulation:  Pharmaceutical: Eliquis             -antiplatelet therapy: N/A 3. Pain Management: Tylenol as needed 4. Mood/Behavior/Sleep: Provide emotional support             -antipsychotic agents: N/A             -Note, as baseline, she will going to shakes in her upper or lower extremities when she is "anxious"; appears  like myoclonus   5. Neuropsych/cognition: This patient is ?capable of making decisions on her own behalf. 6. Skin/Wound Care: Routine skin checks 7. Fluids/Electrolytes/Nutrition: Routine in and outs with follow-up chemistries 8.  Hyperlipidemia.  Zetia 9.  Hypothyroidism.  Synthroid 10.  GERD.  Protonix 11.  CKD stage IIIa.  Follow-up chemistries      Latest Ref Rng & Units 04/24/2023    2:31 PM 04/21/2023    7:12 AM 04/20/2023    6:04 AM  BMP  Glucose 70 - 99 mg/dL 90  86  99   BUN 8 - 23 mg/dL 10  21  16    Creatinine 0.44 - 1.00 mg/dL 2.95  6.21  3.08   Sodium 135 - 145 mmol/L 137  140  139   Potassium 3.5 - 5.1 mmol/L 4.5  4.5  5.7   Chloride 98 - 111 mmol/L 104  109  111   CO2 22 - 32 mmol/L 25  24  24    Calcium 8.9 - 10.3 mg/dL 9.0  8.6  8.7     12.  Atrial fibrillation/SVT/pacemaker.  Cardiac rate controlled.  Continue Eliquis.off metroprolol, but hx of RVR will resume low dose metoprolol and monitor in therapy  13.  Recent COVID infection.  Patient remains afebrile 14.  Diastolic congestive heart failure.  Monitor for any signs of fluid overload  3/8- will order daily weights as well as Lasix 10 mg daily- pt insiistent on Lasix daily- will lower dose to 10 mg from 20 mg- since wasn't clear if taking at home all the time and concerned about Cr.   Fluid intake over the weekend to  Filed Weights   04/23/23 0500 04/24/23 0357 04/25/23 0438  Weight: 49.1 kg 45.3 kg 47.3 kg   Weights down today , monitor over time Monitor for peripheral edema and rales on exam - none today   15.  History of prior stroke Left basal ganglia infarct Sept 2022 Tone and weakness in right upper and lower extremity.   LOS: 4 days A FACE TO FACE EVALUATION WAS PERFORMED  Erick Colace 04/25/2023, 7:56 AM

## 2023-04-25 NOTE — Patient Care Conference (Signed)
 Inpatient RehabilitationTeam Conference and Plan of Care Update Date: 04/25/2023   Time: 16:12 PM    Patient Name: Frances Kent      Medical Record Number: [id number]  Date of Birth: [birth year] Sex: Female         Room/Bed: 4W08C/4W08C-01 Payor Info: Payor: [employer] MEDICARE / Plan: [employer] MEDICARE HMO / Product Type: *No Product type* /    Admit Date/Time:  04/21/2023  5:01 PM  Primary Diagnosis:  Right middle cerebral artery stroke [facility])  Hospital Problems: Principal Problem:   Right middle cerebral artery stroke [facility])    Expected Discharge Date: Expected Discharge Date: 04/26/23  Team Members Present: Physician leading conference: Dr. Claudette Laws Social Worker Present: Cecile Sheerer, LCSWA Nurse Present: Chana Bode, RN PT Present: Bonnita Hollow, PTA OT Present: Primitivo Gauze, OT SLP Present: Pablo Lawrence, SLP     Current Status/Progress Goal Weekly Team Focus  Bowel/Bladder      Continent of bowel and bladder          Swallow/Nutrition/ Hydration   regular/ thin   sup A  implement compensatory strategies    ADL's   supervision - pt has reached goal level and ready for discharge tomorrow   supervision goals   family and pt education completed    Mobility   Supervision overall   supervision  family and pt education completed for d/c    Communication                Safety/Cognition/ Behavioral Observations  supervision- attention, med management   supervision   structured and functional task, awareness, compensatory strategies    Pain      Tylenol prn    Pain managed < 4 with prns    Assess need for and effectiveness of prn medications, pressure relief measures in place for coccyx wound  Skin      Stage 2 to coccyx  Wounds healing with current regimen     Assess skin q shift and signs and symptoms of further breakdown of skin and note healing status or need to change care orders    Discharge Planning:  Pt will discharge to  home with support from her two daughters. Phylllis will be primary caregiver since [name] has some physical limitations. [facility] for HHPT/OT/SLP. SW will confirm there are no barriers to discharge.   Team Discussion: Patient doing well post right MCA CVA.  Patient on target to meet rehab goals: yes  *See Care Plan and progress notes for long and short-term goals.   Revisions to Treatment Plan:  N/a   Teaching Needs: Safety, medications, dietary modification, transfers, toileting, skin care, etc.   Current Barriers to Discharge: Decreased caregiver support, Home enviroment access/layout, and Wound care  Possible Resolutions to Barriers: Family education HH follow up services     Medical Summary Current Status: labile BP, no signs of CHF  Barriers to Discharge: Uncontrolled Hypertension   Possible Resolutions to [employer] Focus: Med management , monitor HR   Continued Need for Acute Rehabilitation Level of Care: The patient requires daily medical management by a physician with specialized training in physical medicine and rehabilitation for the following reasons: Direction of a multidisciplinary physical rehabilitation program to maximize functional independence : Yes Medical management of patient stability for increased activity during participation in an intensive rehabilitation regime.: Yes Analysis of laboratory values and/or radiology reports with any subsequent need for medication adjustment and/or medical intervention. : Yes   I attest that I was present,  lead the team conference, and concur with the assessment and plan of the team.   Pamelia Hoit 04/26/2023, 3:57 PM

## 2023-04-26 ENCOUNTER — Other Ambulatory Visit (HOSPITAL_BASED_OUTPATIENT_CLINIC_OR_DEPARTMENT_OTHER): Payer: Self-pay

## 2023-04-26 ENCOUNTER — Other Ambulatory Visit (HOSPITAL_COMMUNITY): Payer: Self-pay

## 2023-04-26 DIAGNOSIS — I63511 Cerebral infarction due to unspecified occlusion or stenosis of right middle cerebral artery: Secondary | ICD-10-CM | POA: Diagnosis not present

## 2023-04-26 DIAGNOSIS — I5032 Chronic diastolic (congestive) heart failure: Secondary | ICD-10-CM | POA: Diagnosis not present

## 2023-04-26 DIAGNOSIS — I69351 Hemiplegia and hemiparesis following cerebral infarction affecting right dominant side: Secondary | ICD-10-CM | POA: Diagnosis not present

## 2023-04-26 DIAGNOSIS — I4811 Longstanding persistent atrial fibrillation: Secondary | ICD-10-CM | POA: Diagnosis not present

## 2023-04-26 MED ORDER — PANTOPRAZOLE SODIUM 40 MG PO TBEC
40.0000 mg | DELAYED_RELEASE_TABLET | Freq: Every day | ORAL | 0 refills | Status: DC
  Filled 2023-04-26: qty 30, 30d supply, fill #0

## 2023-04-26 MED ORDER — FUROSEMIDE 20 MG PO TABS
10.0000 mg | ORAL_TABLET | Freq: Every day | ORAL | 0 refills | Status: DC
  Filled 2023-04-26: qty 30, 60d supply, fill #0

## 2023-04-26 MED ORDER — METOPROLOL TARTRATE 25 MG PO TABS
12.5000 mg | ORAL_TABLET | Freq: Two times a day (BID) | ORAL | 0 refills | Status: DC
  Filled 2023-04-26: qty 30, 30d supply, fill #0

## 2023-04-26 MED ORDER — LEVOTHYROXINE SODIUM 50 MCG PO TABS
50.0000 ug | ORAL_TABLET | Freq: Every day | ORAL | 0 refills | Status: DC
Start: 2023-04-26 — End: 2023-06-14
  Filled 2023-04-26: qty 30, 30d supply, fill #0

## 2023-04-26 MED ORDER — APIXABAN 2.5 MG PO TABS
2.5000 mg | ORAL_TABLET | Freq: Two times a day (BID) | ORAL | 0 refills | Status: DC
  Filled 2023-04-26: qty 60, 30d supply, fill #0

## 2023-04-26 MED ORDER — EZETIMIBE 10 MG PO TABS
10.0000 mg | ORAL_TABLET | Freq: Every day | ORAL | 0 refills | Status: DC
  Filled 2023-04-26: qty 30, 30d supply, fill #0

## 2023-04-26 MED ORDER — GERHARDT'S BUTT CREAM
1.0000 | TOPICAL_CREAM | Freq: Two times a day (BID) | CUTANEOUS | 0 refills | Status: DC
  Filled 2023-04-26: qty 60, 30d supply, fill #0

## 2023-04-26 NOTE — Plan of Care (Signed)
  Problem: RH BLADDER ELIMINATION Goal: RH STG MANAGE BLADDER WITH ASSISTANCE Description: STG Manage Bladder With toiletingAssistance Outcome: Progressing   Problem: RH SKIN INTEGRITY Goal: RH STG SKIN FREE OF INFECTION/BREAKDOWN Description: Manage skin w min assist Outcome: Progressing   Problem: RH BOWEL ELIMINATION Goal: RH STG MANAGE BOWEL WITH ASSISTANCE Description: STG Manage Bowel with toileting Assistance. Outcome: Progressing   Problem: RH SAFETY Goal: RH STG ADHERE TO SAFETY PRECAUTIONS W/ASSISTANCE/DEVICE Description: STG Adhere to Safety Precautions With cues Assistance/Device. Outcome: Progressing   Problem: RH SKIN INTEGRITY Goal: RH STG SKIN FREE OF INFECTION/BREAKDOWN Description: Manage skin w min assist Outcome: Progressing   Problem: RH BLADDER ELIMINATION Goal: RH STG MANAGE BLADDER WITH ASSISTANCE Description: STG Manage Bladder With toiletingAssistance Outcome: Progressing   Problem: RH BOWEL ELIMINATION Goal: RH STG MANAGE BOWEL WITH ASSISTANCE Description: STG Manage Bowel with toileting Assistance. Outcome: Progressing

## 2023-04-26 NOTE — Progress Notes (Signed)
 Inpatient Rehabilitation Care Coordinator Discharge Note   Patient Details  Name: Frances Kent MRN: [id number] Date of Birth: [birth year]   Discharge location: D/c to home with support from daughters  Length of Stay: 4 days  Discharge activity level: Supervision  Home/community participation: Limited  Patient response YQ:MVHQIO Literacy - How often do you need to have someone help you when you read instructions, pamphlets, or other written material from your doctor or pharmacy?: Rarely  Patient response NG:EXBMWU Isolation - How often do you feel lonely or isolated from those around you?: Never  Services provided included: MD, RD, PT, OT, SLP, RN, CM, TR, Pharmacy, Neuropsych, SW  Financial Services:  Field seismologist Utilized: Private Insurance [employer]  Choices offered to/list presented to: patient and daughter  Follow-up services arranged:  Home Health Home Health Agency: [facility] for HHPT/OT/SLP         Patient response to transportation need: Is the patient able to respond to transportation needs?: Yes In the past 12 months, has lack of transportation kept you from medical appointments or from getting medications?: No In the past 12 months, has lack of transportation kept you from meetings, work, or from getting things needed for daily living?: No   Patient/Family verbalized understanding of follow-up arrangements:  Yes  Individual responsible for coordination of the follow-up plan: contact pt dtr Rinaldo Cloud # [phone]  Confirmed correct DME delivered: Gretchen Short 04/26/2023    Comments (or additional information):fam edu completed; [facility] SOC will be this weekend  Summary of Stay    Date/Time Discharge Planning CSW  04/26/23 0929 Pt will discharge to home with support from her two daughters. Phylllis will be primary caregiver since [name] has some physical limitations. [facility] for HHPT/OT/SLP. SW will confirm there are no barriers  to discharge. AAC       Jasminne Mealy A Lula Olszewski

## 2023-04-26 NOTE — Progress Notes (Signed)
 PROGRESS NOTE   Subjective/Complaints:  Pt without new issues  Discussed meds including Eliquis vs Xarelto  Pt has just paid for 72mo supply of Xarelto , switched from Xarelto to Eliquis with this admission   ROS:   Pt denies SOB, abd pain, CP, N/V/C/D, and vision changes  Objective:   No results found.  Recent Labs    04/24/23 0531  WBC 6.9  HGB 11.3*  HCT 34.8*  PLT 175   Recent Labs    04/24/23 1431  NA 137  K 4.5  CL 104  CO2 25  GLUCOSE 90  BUN 10  CREATININE 1.14*  CALCIUM 9.0     Intake/Output Summary (Last 24 hours) at 04/26/2023 0836 Last data filed at 04/26/2023 0804 Gross per 24 hour  Intake 340 ml  Output --  Net 340 ml     Pressure Injury 04/21/23 Coccyx Mid Stage 2 -  Partial thickness loss of dermis presenting as a shallow open injury with a red, pink wound bed without slough. stage 2 to coccyx with 2 small areas close to merging into 1 area (Active)  04/21/23 1754  Location: Coccyx  Location Orientation: Mid  Staging: Stage 2 -  Partial thickness loss of dermis presenting as a shallow open injury with a red, pink wound bed without slough.  Wound Description (Comments): stage 2 to coccyx with 2 small areas close to merging into 1 area  Present on Admission: Yes    Physical Exam: Vital Signs Blood pressure (!) 151/71, pulse 70, temperature 98.3 F (36.8 C), temperature source Oral, resp. rate 16, height 5\' 3"  (1.6 m), weight 45.6 kg, SpO2 98%.  General: No acute distress Mood and affect are appropriate Heart: Regular rate and rhythm no rubs murmurs or extra sounds  Lungs: Clear to auscultation, breathing unlabored, no rales or wheezes Abdomen: Positive bowel sounds, soft nontender to palpation, nondistended Extremities: No clubbing, cyanosis, or edema Skin: No evidence of breakdown, no evidence of rash   Neurological: Ox3- but perseverating on fluid pill Extremities- no LE  edema at all- not even trace Skin: C/D/I. No apparent lesions.  IV intact. -Slight MASD on coccyx  MSK:      Right upper extremity digits 3-4 will crack and sublux slightly at the MCPs when extended to neutral.             Otherwise, full active range of motion       Neurologic exam:  Cognition: AAO to person, place, time and event.  Language: Fluent, No substitutions or neoglisms. No dysarthria or aphasia noted.  No field cut  Insight: Good insight into current condition.  Mood: Pleasant affect, appropriate mood.  Sensation: To light touch intact in BL UEs and LEs  Reflexes: 2+ in BL UE and LEs. Negative Hoffman's and babinski signs bilaterally.  Right UE 4/5, RLE 4/5 , left size 5/5      Assessment/Plan: 1. Functional deficits which require 3+ hours per day of interdisciplinary therapy in a comprehensive inpatient rehab setting. Physiatrist is providing close team supervision and 24 hour management of active medical problems listed below. Physiatrist and rehab team continue to assess barriers to discharge/monitor patient progress  toward functional and medical goals  Care Tool:  Bathing    Body parts bathed by patient: Right arm, Left arm, Chest, Abdomen, Front perineal area, Right upper leg, Left upper leg, Face, Right lower leg, Left lower leg, Buttocks   Body parts bathed by helper: Right lower leg, Left lower leg, Buttocks     Bathing assist Assist Level: Supervision/Verbal cueing     Upper Body Dressing/Undressing Upper body dressing   What is the patient wearing?: Pull over shirt    Upper body assist Assist Level: Supervision/Verbal cueing    Lower Body Dressing/Undressing Lower body dressing      What is the patient wearing?: Pants, Underwear/pull up     Lower body assist Assist for lower body dressing: Supervision/Verbal cueing     Toileting Toileting    Toileting assist Assist for toileting: Supervision/Verbal cueing     Transfers Chair/bed  transfer  Transfers assist     Chair/bed transfer assist level: Supervision/Verbal cueing     Locomotion Ambulation   Ambulation assist      Assist level: Supervision/Verbal cueing Assistive device: Walker-rolling Max distance: 200   Walk 10 feet activity   Assist     Assist level: Supervision/Verbal cueing Assistive device: Walker-rolling   Walk 50 feet activity   Assist    Assist level: Supervision/Verbal cueing Assistive device: Walker-rolling    Walk 150 feet activity   Assist Walk 150 feet activity did not occur: Safety/medical concerns (fatigue)  Assist level: Supervision/Verbal cueing Assistive device: Walker-rolling    Walk 10 feet on uneven surface  activity   Assist     Assist level: Minimal Assistance - Patient > 75% Assistive device: Walker-rolling   Wheelchair     Assist Is the patient using a wheelchair?: No Type of Wheelchair: Manual    Wheelchair assist level: Dependent - Patient 0% Max wheelchair distance: 150'    Wheelchair 50 feet with 2 turns activity    Assist        Assist Level: Dependent - Patient 0%   Wheelchair 150 feet activity     Assist      Assist Level: Dependent - Patient 0%   Blood pressure (!) 151/71, pulse 70, temperature 98.3 F (36.8 C), temperature source Oral, resp. rate 16, height 5\' 3"  (1.6 m), weight 45.6 kg, SpO2 98%.   Medical Problem List and Plan: 1. Functional deficits secondary to right MCA and cerebellar infarct right M1 and ACA occlusion status post IR thrombectomy with TICI3 and TICI2c reperfusion respectively, embolic secondary to A-fib even on Xarelto.             -patient may shower             -ELOS/Goals: plan d/c toda   Con't CIR 2.  Antithrombotics: -DVT/anticoagulation:  Pharmaceutical: Eliquis recommended by Neuro but pt has 3 mo supply of Xarelto which cost over 300.00, ok to take this and discuss further with cardiology at appt in am               -antiplatelet therapy: N/A 3. Pain Management: Tylenol as needed 4. Mood/Behavior/Sleep: Provide emotional support             -antipsychotic agents: N/A             -Note, as baseline, she will going to shakes in her upper or lower extremities when she is "anxious"; appears like myoclonus   5. Neuropsych/cognition: This patient is ?capable of making decisions on her own behalf.  6. Skin/Wound Care: Routine skin checks 7. Fluids/Electrolytes/Nutrition: Routine in and outs with follow-up chemistries 8.  Hyperlipidemia.  Zetia 9.  Hypothyroidism.  Synthroid 10.  GERD.  Protonix 11.  CKD stage IIIa.  Follow-up chemistries      Latest Ref Rng & Units 04/24/2023    2:31 PM 04/21/2023    7:12 AM 04/20/2023    6:04 AM  BMP  Glucose 70 - 99 mg/dL 90  86  99   BUN 8 - 23 mg/dL 10  21  16    Creatinine 0.44 - 1.00 mg/dL 1.19  1.47  8.29   Sodium 135 - 145 mmol/L 137  140  139   Potassium 3.5 - 5.1 mmol/L 4.5  4.5  5.7   Chloride 98 - 111 mmol/L 104  109  111   CO2 22 - 32 mmol/L 25  24  24    Calcium 8.9 - 10.3 mg/dL 9.0  8.6  8.7     12.  Atrial fibrillation/SVT/pacemaker.  Cardiac rate controlled.  Continue Eliquis.off metroprolol, but hx of RVR will resume low dose metoprolol and monitor in therapy  13.  Recent COVID infection.  Patient remains afebrile 14.  Diastolic congestive heart failure.  Monitor for any signs of fluid overload  3/8- will order daily weights as well as Lasix 10 mg daily- pt insiistent on Lasix daily- will lower dose to 10 mg from 20 mg- since wasn't clear if taking at home all the time and concerned about Cr.   Fluid intake over the weekend to - discussed her PCP or cardiologist may increase the lasix if weights creep up due to intake  Filed Weights   04/24/23 0357 04/25/23 0438 04/26/23 0631  Weight: 45.3 kg 47.3 kg 45.6 kg   Weights essentially stable , daughter checks daily weights at home as well  Monitor for peripheral edema and rales on exam - none  today   15.  History of prior stroke Left basal ganglia infarct Sept 2022 Tone and weakness in right upper and lower extremity.   LOS: 5 days A FACE TO FACE EVALUATION WAS PERFORMED  Erick Colace 04/26/2023, 8:36 AM

## 2023-04-26 NOTE — Progress Notes (Signed)
 Inpatient Rehabilitation Discharge Medication Review by a Pharmacist  A complete drug regimen review was completed for this patient to identify any potential clinically significant medication issues.  High Risk Drug Classes Is patient taking? Indication by Medication  Antipsychotic No   Anticoagulant Yes Eliquis - Afib, PE history  Antibiotic No   Opioid No   Antiplatelet No   Hypoglycemics/insulin No   Vasoactive Medication Yes Furosemide - fluid Metoprolol-HTN, Afib  Chemotherapy No   Other Yes Zetia - HLD Levothyroxine - low thyroid Pantoprazole - Reflux     Type of Medication Issue Identified Description of Issue Recommendation(s)  Drug Interaction(s) (clinically significant)     Duplicate Therapy     Allergy     No Medication Administration End Date     Incorrect Dose     Additional Drug Therapy Needed     Significant med changes from prior encounter (inform family/care partners about these prior to discharge).    Other       Clinically significant medication issues were identified that warrant physician communication and completion of prescribed/recommended actions by midnight of the next day:  No  Name of provider notified for urgent issues identified:   Provider Method of Notification:     Pharmacist comments: None  Time spent performing this drug regimen review (minutes): 20 minutes  Okey Regal, PharmD

## 2023-04-27 DIAGNOSIS — I10 Essential (primary) hypertension: Secondary | ICD-10-CM | POA: Diagnosis not present

## 2023-04-27 DIAGNOSIS — I5023 Acute on chronic systolic (congestive) heart failure: Secondary | ICD-10-CM | POA: Diagnosis not present

## 2023-04-27 DIAGNOSIS — Z86711 Personal history of pulmonary embolism: Secondary | ICD-10-CM | POA: Diagnosis not present

## 2023-04-27 DIAGNOSIS — I4892 Unspecified atrial flutter: Secondary | ICD-10-CM | POA: Diagnosis not present

## 2023-04-27 DIAGNOSIS — I5020 Unspecified systolic (congestive) heart failure: Secondary | ICD-10-CM | POA: Diagnosis not present

## 2023-04-27 DIAGNOSIS — R001 Bradycardia, unspecified: Secondary | ICD-10-CM | POA: Diagnosis not present

## 2023-04-27 DIAGNOSIS — I272 Pulmonary hypertension, unspecified: Secondary | ICD-10-CM | POA: Diagnosis not present

## 2023-04-27 DIAGNOSIS — I700 Atherosclerosis of aorta: Secondary | ICD-10-CM | POA: Diagnosis not present

## 2023-04-27 DIAGNOSIS — I2692 Saddle embolus of pulmonary artery without acute cor pulmonale: Secondary | ICD-10-CM | POA: Diagnosis not present

## 2023-04-27 NOTE — Progress Notes (Signed)
 Inpatient Rehabilitation Care Coordinator Assessment and Plan Patient Details  Name: Frances Kent MRN: [id number] Date of Birth: August 01, 1930  Today's Date: 04/27/2023  Hospital Problems: Principal Problem:   Right middle cerebral artery stroke [facility])  Past Medical History:  Past Medical History:  Diagnosis Date   Arthritis    Atrial fibrillation (HCC)    Benign breast cyst in female, left 10/07/2016   Candida esophagitis (HCC) 05/03/2021   Found on Mar 10 2021 EGD    CAP (community acquired pneumonia) 02/15/2021   CHF (congestive heart failure) (HCC)    Colon adenomas    GERD (gastroesophageal reflux disease)    HCAP (healthcare-associated pneumonia) 02/02/2021   Hypertension    Hypothyroidism    Macular degeneration    Mitral regurgitation    Pulmonary embolism (HCC) 02/2016   Severe sepsis (HCC) 02/02/2021   Stroke (HCC) 10/2020   SVT (supraventricular tachycardia) (HCC)    Past Surgical History:  Past Surgical History:  Procedure Laterality Date   APPENDECTOMY  1960   BREAST CYST ASPIRATION Left 2017   CARDIOVERSION N/A 04/05/2016   Procedure: Cardioversion;  Surgeon: Lamar Blinks, MD;  Location: [facility] ORS;  Service: Cardiovascular;  Laterality: N/A;   CHOLECYSTECTOMY  1985   ESOPHAGOGASTRODUODENOSCOPY N/A 03/10/2021   Procedure: ESOPHAGOGASTRODUODENOSCOPY (EGD);  Surgeon: Toledo, Boykin Nearing, MD;  Location: [facility] ENDOSCOPY;  Service: Gastroenterology;  Laterality: N/A;   ESOPHAGOGASTRODUODENOSCOPY (EGD) WITH PROPOFOL N/A 11/27/2019   Procedure: ESOPHAGOGASTRODUODENOSCOPY (EGD) WITH PROPOFOL;  Surgeon: Regis Bill, MD;  Location: [facility] ENDOSCOPY;  Service: Endoscopy;  Laterality: N/A;   ESOPHAGOGASTRODUODENOSCOPY (EGD) WITH PROPOFOL N/A 12/24/2019   Procedure: ESOPHAGOGASTRODUODENOSCOPY (EGD) WITH PROPOFOL;  Surgeon: Regis Bill, MD;  Location: [facility] ENDOSCOPY;  Service: Endoscopy;  Laterality: N/A;   IR CT HEAD LTD  11/01/2020   IR CT HEAD LTD   04/19/2023   IR PERCUTANEOUS ART THROMBECTOMY/INFUSION INTRACRANIAL INC DIAG ANGIO  11/01/2020   IR PERCUTANEOUS ART THROMBECTOMY/INFUSION INTRACRANIAL INC DIAG ANGIO  04/19/2023   IR US GUIDE VASC ACCESS RIGHT  11/02/2020   OVARIAN CYST REMOVAL     PACEMAKER LEADLESS INSERTION N/A 11/16/2021   Procedure: PACEMAKER LEADLESS INSERTION;  Surgeon: Marcina Millard, MD;  Location: [facility] INVASIVE CV LAB;  Service: Cardiovascular;  Laterality: N/A;   RADIOLOGY WITH ANESTHESIA N/A 11/01/2020   Procedure: RADIOLOGY WITH ANESTHESIA;  Surgeon: Julieanne Cotton, MD;  Location: [facility] OR;  Service: Radiology;  Laterality: N/A;   RADIOLOGY WITH ANESTHESIA N/A 04/19/2023   Procedure: RADIOLOGY WITH ANESTHESIA;  Surgeon: Radiologist, Medication, MD;  Location: [facility] OR;  Service: Radiology;  Laterality: N/A;   TEE WITHOUT CARDIOVERSION N/A 02/01/2018   Procedure: TRANSESOPHAGEAL ECHOCARDIOGRAM (TEE);  Surgeon: Lamar Blinks, MD;  Location: [facility] ORS;  Service: Cardiovascular;  Laterality: N/A;   Social History:  reports that she has never smoked. She has never used smokeless tobacco. She reports that she does not drink alcohol and does not use drugs.  Family / Support Systems Marital Status: Married How Long?: widowed since 2014 Spouse/Significant Other: Widowed Children: 3 daughters and 2 sons. Other Supports: None Anticipated Caregiver: Dtrs Phyllis and [name] Ability/Limitations of Caregiver: Pt dtr [name] recently had an injury and unable to bed her leg; dtr [name] will be primary caregiver. 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 Guardian/Conservator: POA- dtr [name]   Abuse/Neglect Abuse/Neglect Assessment Can Be Completed: Yes Physical Abuse: Denies Verbal Abuse: Denies Sexual Abuse: Denies Exploitation of patient/patient's resources: Denies Self-Neglect: Denies  Patient response to: Social Isolation - How often do you feel lonely or isolated from those around you?: Never  Emotional Status Pt's affect, behavior and adjustment status: Pt in good spirits at time of visit Recent Psychosocial Issues: Denies Psychiatric History: Denies Substance Abuse History: Denies  Patient / Family Perceptions, Expectations & Goals Pt/Family understanding of illness & functional limitations: Pt and family have a general understanding of pt care needs Premorbid pt/family roles/activities: independent Anticipated changes in roles/activities/participation: Assistance with ADLs/IADLs Pt/family expectations/goals: pt goal is to wokr on getting up and down on own  [employer]: None Premorbid Home Care/DME Agencies: None Transportation available at discharge: TBD Is the patient able to respond to transportation needs?: Yes In the past 12 months, has lack of transportation kept you from medical appointments or from getting medications?: No In the past 12 months, has lack of transportation kept you from meetings, work, or from getting things needed for daily living?: No Resource referrals recommended: Neuropsychology  Discharge Planning Living Arrangements: Alone Support Systems: Children, Other relatives Type of Residence: Private residence Insurance Resources: Media planner (specify) ([employer] Medicare) Financial Resources: Social Security Financial Screen Referred: No Living Expenses: Own Money Management: Patient, Family Does the patient have any problems obtaining your medications?:  No Home Management: Dtr Phyllis manages all home care needs Patient/Family Preliminary Plans: No changes Care Coordinator Barriers to Discharge: Insurance for SNF coverage, Lack of/limited family support, Decreased caregiver support Care Coordinator Anticipated Follow Up Needs: HH/OP  Clinical Impression SW met with pt during OT session. Pt is not a Cytogeneticist. POA-dtr [name]. DME: RW, 3in1 BSC, walk in shower and has shower seat.   Shantale Holtmeyer A Jelisha Weed 04/27/2023, 10:12 PM

## 2023-05-01 ENCOUNTER — Telehealth: Payer: Self-pay

## 2023-05-01 NOTE — Patient Outreach (Signed)
 Care Coordination   Follow Up Visit Note   05/01/2023 Name: Frances Kent MRN: [id number] DOB: December 11, 1930  KHORI UNDERBERG is a 89 y.o. year old female who sees Darrick Huntsman, Mar Daring, MD for primary care. I  spoke with daughter/ designated party release, Frances Kent today.   What matters to the patients health and wellness today?  Per chart review patient hospitalized from 04/19/23 -/ 04/21/23 for stroke and post rehab  04/21/23 to 3/12.  Daughter states patient is doing well. She states she received a call from [facility] home health today to discuss patients upcoming start of care visit.  Daughter states she called Palliative care last week to initiate services. She states she has not heard back and will therefore give them another call. Daughter states patient has been eating well. She states patient still has right sided deficit due to stroke. She states patient drags her foot slightly on the right.  She reports patient using her walker well and states she is able to maneuver from room to room without assistance. Daughter states patient complained of not feeling well on yesterday. She states patient reported feeling weaker.  Daughter states patient took 1/2 of sleeping pill trazodone the night before and took 1/4 of the sertraline on yesterday.  Per chart review after visit summary/ discharge instructions on 04/26/23 advised that trazodone should be stopped. Discharge instruction note on 04/21/23 states sertraline discontinued. Daughter denies patient having any recent falls. Denies any increase in HF or atrial fibrillation symptoms. Per chart review patient scheduled for post hospital follow up visit with primary provider on 05/03/23 and had cardiology follow up on 04/27/23.    Goals Addressed             This Visit's Progress    Continued improvement post hospitalization and managment of health conditions       Interventions Today    Flowsheet Row Most Recent Value  Chronic Disease   Chronic disease  during today's visit Other, Congestive Heart Failure (CHF), Atrial Fibrillation (AFib)  [stroke]  General Interventions   General Interventions Discussed/Reviewed General Interventions Reviewed, Labs, Doctor Visits  [evaluation of current treatment plan for listed health conditions and patient adherence to plan as established by provider. Assessed for new/ ongoing symptom/ stroke like symptoms, HF/ Atrial fibrillation symptoms/ pressure sore.]  Labs Kidney Function  [Discussed kidney function labs. Advised to discuss lab concerns with primary care provider at upcoming visit on 05/03/23.]  Doctor Visits Discussed/Reviewed Doctor Visits Reviewed  [facility] upcoming provider visits. Advised to discuss neurology follow up with primary care provider.]  Exercise Interventions   Exercise Discussed/Reviewed Physical Activity  [Assessed patients current activity level. Assessed for active home health services.]  Education Interventions   Education Provided Provided Education  [Reviewed stroke symptoms and advised to call 911 if noticed.]  Provided Verbal Education On Other  [Advised daughter to call RN case manager delayed start of care with home health or pallliative care.]  Nutrition Interventions   Nutrition Discussed/Reviewed Nutrition Reviewed  Pearletha Furl current appetite.]  Pharmacy Interventions   Pharmacy Dicussed/Reviewed Pharmacy Topics Reviewed  [medication reconciliation completed. Reviewed discontinued medications from discharge after visit summary . Advised to not take trazodone and sertraline per hospital recommendation and discuss further with primary provider.]  Safety Interventions   Safety Discussed/Reviewed Fall Risk  [Fall prevention discussed. Discussed effects of trazodone and sertraline related to potential falls.]              SDOH assessments and interventions  completed:  No     Care Coordination Interventions:  Yes, provided   Follow up plan: Follow up call scheduled  for 05/15/23 at 1 pm     Encounter Outcome:  Patient Visit Completed   George Ina RN, BSN, CCM   [facility], Population Health Case Manager Phone: [phone]

## 2023-05-01 NOTE — Patient Instructions (Signed)
 Visit Information  Thank you for taking time to visit with me today. Please don't hesitate to contact me if I can be of assistance to you.   Following are the goals we discussed today:   Goals Addressed             This Visit's Progress    Continued improvement post hospitalization and managment of health conditions       Interventions Today    Flowsheet Row Most Recent Value  Chronic Disease   Chronic disease during today's visit Other, Congestive Heart Failure (CHF), Atrial Fibrillation (AFib)  [stroke]  General Interventions   General Interventions Discussed/Reviewed General Interventions Reviewed, Labs, Doctor Visits  [evaluation of current treatment plan for listed health conditions and patient adherence to plan as established by provider. Assessed for new/ ongoing symptom/ stroke like symptoms, HF/ Atrial fibrillation symptoms/ pressure sore.]  Labs Kidney Function  [Discussed kidney function labs. Advised to discuss lab concerns with primary care provider at upcoming visit on 05/03/23.]  Doctor Visits Discussed/Reviewed Doctor Visits Reviewed  [facility] upcoming provider visits. Advised to discuss neurology follow up with primary care provider.]  Exercise Interventions   Exercise Discussed/Reviewed Physical Activity  [Assessed patients current activity level. Assessed for active home health services.]  Education Interventions   Education Provided Provided Education  [Reviewed stroke symptoms and advised to call 911 if noticed.]  Provided Verbal Education On Other  [Advised daughter to call RN case manager delayed start of care with home health or pallliative care.]  Nutrition Interventions   Nutrition Discussed/Reviewed Nutrition Reviewed  Pearletha Furl current appetite.]  Pharmacy Interventions   Pharmacy Dicussed/Reviewed Pharmacy Topics Reviewed  [medication reconciliation completed. Reviewed discontinued medications from discharge after visit summary . Advised to not take  trazodone and sertraline per hospital recommendation and discuss further with primary provider.]  Safety Interventions   Safety Discussed/Reviewed Fall Risk  [Fall prevention discussed. Discussed effects of trazodone and sertraline related to potential falls.]              Our next appointment is by telephone on 05/15/23 at 1 pm  Please call the care guide team at [phone] if you need to cancel or reschedule your appointment.   If you are experiencing a Mental Health or Behavioral Health Crisis or need someone to talk to, please call the Suicide and Crisis Lifeline: 988 call 1-800-273-TALK (toll free, 24 hour hotline)  Patient verbalizes understanding of instructions and care plan provided today and agrees to view in MyChart. Active MyChart status and patient understanding of how to access instructions and care plan via MyChart confirmed with patient.     George Ina RN, BSN, CCM [employer], Population Health Case Manager Phone: [phone]

## 2023-05-02 ENCOUNTER — Encounter: Payer: Self-pay | Admitting: Internal Medicine

## 2023-05-02 DIAGNOSIS — I482 Chronic atrial fibrillation, unspecified: Secondary | ICD-10-CM | POA: Diagnosis not present

## 2023-05-02 DIAGNOSIS — I4892 Unspecified atrial flutter: Secondary | ICD-10-CM | POA: Diagnosis not present

## 2023-05-02 DIAGNOSIS — I69320 Aphasia following cerebral infarction: Secondary | ICD-10-CM | POA: Diagnosis not present

## 2023-05-02 DIAGNOSIS — I471 Supraventricular tachycardia, unspecified: Secondary | ICD-10-CM | POA: Diagnosis not present

## 2023-05-02 DIAGNOSIS — I69354 Hemiplegia and hemiparesis following cerebral infarction affecting left non-dominant side: Secondary | ICD-10-CM | POA: Diagnosis not present

## 2023-05-02 DIAGNOSIS — I130 Hypertensive heart and chronic kidney disease with heart failure and stage 1 through stage 4 chronic kidney disease, or unspecified chronic kidney disease: Secondary | ICD-10-CM | POA: Diagnosis not present

## 2023-05-02 DIAGNOSIS — N1832 Chronic kidney disease, stage 3b: Secondary | ICD-10-CM | POA: Diagnosis not present

## 2023-05-02 DIAGNOSIS — I5022 Chronic systolic (congestive) heart failure: Secondary | ICD-10-CM | POA: Diagnosis not present

## 2023-05-02 DIAGNOSIS — I5030 Unspecified diastolic (congestive) heart failure: Secondary | ICD-10-CM | POA: Diagnosis not present

## 2023-05-03 ENCOUNTER — Ambulatory Visit: Admitting: Internal Medicine

## 2023-05-03 ENCOUNTER — Encounter: Payer: Self-pay | Admitting: Internal Medicine

## 2023-05-03 VITALS — BP 114/52 | HR 75 | Ht 63.0 in | Wt 105.8 lb

## 2023-05-03 DIAGNOSIS — I6601 Occlusion and stenosis of right middle cerebral artery: Secondary | ICD-10-CM | POA: Diagnosis not present

## 2023-05-03 DIAGNOSIS — D6869 Other thrombophilia: Secondary | ICD-10-CM | POA: Diagnosis not present

## 2023-05-03 DIAGNOSIS — Z7189 Other specified counseling: Secondary | ICD-10-CM | POA: Diagnosis not present

## 2023-05-03 DIAGNOSIS — I6930 Unspecified sequelae of cerebral infarction: Secondary | ICD-10-CM

## 2023-05-03 DIAGNOSIS — I5022 Chronic systolic (congestive) heart failure: Secondary | ICD-10-CM

## 2023-05-03 DIAGNOSIS — Z09 Encounter for follow-up examination after completed treatment for conditions other than malignant neoplasm: Secondary | ICD-10-CM

## 2023-05-03 DIAGNOSIS — I700 Atherosclerosis of aorta: Secondary | ICD-10-CM

## 2023-05-03 NOTE — Assessment & Plan Note (Signed)
 UNTREATED due to statin myalgia . She is now taking zetia

## 2023-05-03 NOTE — Assessment & Plan Note (Addendum)
 EF has dropped to 30-35% by recent in house ECHO MARCH 2025WITH DIFFUSE hypokinesis.  Reviewed recent cardiology follow up lith Dr Darrold Junker: o n prior admission Amiodarone was added, diltiazem discontinued due to reduced LV function.  Patient was unable to tolerate amiodarone, now on metoprolol succinate 25 mg twice daily for rate control.  Xarelto switched to low-dose Eliquis prior to discharge on March 7 for embolic CVA  otherwise  no change to regimen

## 2023-05-03 NOTE — Patient Instructions (Addendum)
 I recommend resuming sertraline DAILY to help manage your anxiety   Start with 1/2 tablet  daily with food   [facility] referral  is in progress to their Neurologist  You do not need to go to the ER for "the shakes"

## 2023-05-03 NOTE — Assessment & Plan Note (Signed)
 secondary to atrial fibrillation.  She suffered an embolic MCA stroke while taking Xarelto .  Medication has been changed to Eliquis

## 2023-05-03 NOTE — Assessment & Plan Note (Signed)
Patient is stable post discharge and has no new issues or questions about discharge plans at the visit today for hospital follow up.  I have reviewed the records from the hospital admission in detail with patient today. 

## 2023-05-03 NOTE — Assessment & Plan Note (Signed)
 Thyroid function is WNL on current dose.  No current changes needed.   Lab Results  Component Value Date   TSH 1.920 04/14/2023

## 2023-05-03 NOTE — Assessment & Plan Note (Signed)
 Encouarged her to work with PT but reassured her that should she decide otherwise,  we will reinstate Palliative Care

## 2023-05-03 NOTE — Assessment & Plan Note (Signed)
 S/p successful thrombectomy April 20 2023.  Residual weakness appears to be resolved, right leg is reportedly weaker thn left.  Continue eliquis,  home PT ,  neurology consult for follow up ordered today via [facility]

## 2023-05-03 NOTE — Progress Notes (Signed)
 Subjective:  Patient ID: Frances Kent, female    DOB: [birth year]  Age: 89 y.o. MRN: [id number]  CC: The primary encounter diagnosis was History of CVA with residual deficit. Diagnoses of Middle cerebral artery embolism, right, Acquired thrombophilia (HCC), Abdominal aortic atherosclerosis (HCC), Counseling regarding goals of care, Hospital discharge follow-up, and Chronic HFrEF (heart failure with reduced ejection fraction) (HCC) were also pertinent to this visit.   HPI VANGIE HENTHORN presents for  Chief Complaint  Patient presents with   Hospitalization Follow-up   Accompanied by daughter Zoeie Ritter WAS ADMITTED TO [facility] ON March 5  as a CODE STROKE with sudden onset aphasia and left sided weakness   CTA noted   right MCA and cerebellar infarcts with right M1 and ACA occlusions s/p successful thrombectomy via VIR with TICI3 and TICI2c respectively,  and   discharged on March 7 to [facility] inpatient rehab   for inpatient therapies to consist of PT, ST and OT at least three hours five days a week. She was discharged home on March 12 on low dose Eliquis,    Patient has not had  follow-up with neurology  or  interventional radiology.  Also prescribed Zetia ongoing for hyperlipidemia.  Hormone supplement with hypothyroidism as indicated.    CKD:  GFR dropped during admission but improved by last BMET March 10 .  She is  sleeping well  despite dc of trazodone .  She had a setback after taking  1/2  trazodone Saturday night ,, became weak and lethargic and "went downhill"    First PT home session was yesterday  but per family the patient has stated that she is not going to do exercises at home on the off days . "I just feel so  bad." Having trouble getting up and down ,  has a wheelchair but requires assistance with transfers . Family notes that she is having episodes of diffuse shaking/tremors that make her difficult to move. These were referenced during hospital stay and attributed to  anxiety  Palliative care was ordered and initiated prior to last hospitalization but has not been to see her since discharge and Home PT has been started    Review of Systems;  Patient denies headache, fevers, malaise, unintentional weight loss, skin rash, eye pain, sinus congestion and sinus pain, sore throat, dysphagia,  hemoptysis , cough, dyspnea, wheezing, chest pain, palpitations, orthopnea, edema, abdominal pain, nausea, melena, diarrhea, constipation, flank pain, dysuria, hematuria, urinary  Frequency, nocturia, numbness, tingling, seizures,  Focal weakness, Loss of consciousness,  Tremor, insomnia, depression, anxiety, and suicidal ideation.      Objective:  BP (!) 114/52   Pulse 75   Ht 5\' 3"  (1.6 m)   Wt 105 lb 12.8 oz (48 kg)   SpO2 96%   BMI 18.74 kg/m   BP Readings from Last 3 Encounters:  05/03/23 (!) 114/52  04/26/23 (!) 151/71  04/21/23 (!) 113/56    Wt Readings from Last 3 Encounters:  05/03/23 105 lb 12.8 oz (48 kg)  04/26/23 100 lb 8.5 oz (45.6 kg)  04/19/23 104 lb 15 oz (47.6 kg)    Physical Exam Vitals reviewed.  Constitutional:      General: She is not in acute distress.    Appearance: Normal appearance. She is underweight. She is not ill-appearing, toxic-appearing or diaphoretic.     Comments: Seated in wheelchair   HENT:     Head: Normocephalic.  Eyes:     General: No  scleral icterus.       Right eye: No discharge.        Left eye: No discharge.     Conjunctiva/sclera: Conjunctivae normal.  Cardiovascular:     Rate and Rhythm: Normal rate and regular rhythm.     Heart sounds: Normal heart sounds.  Pulmonary:     Effort: Pulmonary effort is normal. No respiratory distress.     Breath sounds: Normal breath sounds.  Musculoskeletal:        General: Normal range of motion.  Skin:    General: Skin is warm and dry.  Neurological:     General: No focal deficit present.     Mental Status: She is alert and oriented to person, place, and time.  Mental status is at baseline.  Psychiatric:        Mood and Affect: Mood normal.        Behavior: Behavior normal.        Thought Content: Thought content normal.        Judgment: Judgment normal.    Lab Results  Component Value Date   HGBA1C 6.2 (H) 04/19/2023   HGBA1C 5.8 (H) 11/02/2020   HGBA1C 6.1 (H) 10/16/2020    Lab Results  Component Value Date   CREATININE 1.14 (H) 04/24/2023   CREATININE 1.38 (H) 04/21/2023   CREATININE 1.39 (H) 04/20/2023    Lab Results  Component Value Date   WBC 6.9 04/24/2023   HGB 11.3 (L) 04/24/2023   HCT 34.8 (L) 04/24/2023   PLT 175 04/24/2023   GLUCOSE 90 04/24/2023   CHOL 239 (H) 04/19/2023   TRIG 87 04/20/2023   HDL 82 04/19/2023   LDLDIRECT 185.3 03/27/2013   LDLCALC 143 (H) 04/19/2023   ALT 12 04/24/2023   AST 16 04/24/2023   NA 137 04/24/2023   K 4.5 04/24/2023   CL 104 04/24/2023   CREATININE 1.14 (H) 04/24/2023   BUN 10 04/24/2023   CO2 25 04/24/2023   TSH 1.920 04/14/2023   INR 4.9 (HH) 04/19/2023   HGBA1C 6.2 (H) 04/19/2023    No results found.  Assessment & Plan:  .History of CVA with residual deficit -     Ambulatory referral to Neurology  Middle cerebral artery embolism, right Assessment & Plan: S/p successful thrombectomy April 20 2023.  Residual weakness appears to be resolved, right leg is reportedly weaker thn left.  Continue eliquis,  home PT ,  neurology consult for follow up ordered today via [facility]    Acquired thrombophilia [facility]) Assessment & Plan: secondary to atrial fibrillation.  She suffered an embolic MCA stroke while taking Xarelto .  Medication has been changed to Eliquis    Abdominal aortic atherosclerosis (HCC) Assessment & Plan: UNTREATED due to statin myalgia . She is now taking zetia    Counseling regarding goals of care Assessment & Plan: Encouarged her to work with PT but reassured her that should she decide otherwise,  we will reinstate Palliative Care    Hospital  discharge follow-up Assessment & Plan: Patient is stable post discharge and has no new issues or questions about discharge plans at the visit today for hospital follow up.  I have reviewed the records from the hospital admission in detail with patient today.    Chronic HFrEF (heart failure with reduced ejection fraction) (HCC) Assessment & Plan: EF has dropped to 30-35% by recent in house ECHO MARCH 2025WITH DIFFUSE hypokinesis.  Reviewed recent cardiology follow up lith Dr Darrold Junker: o n prior  admission Amiodarone was added, diltiazem discontinued due to reduced LV function.  Patient was unable to tolerate amiodarone, now on metoprolol succinate 25 mg twice daily for rate control.  Xarelto switched to low-dose Eliquis prior to discharge on March 7 for embolic CVA  otherwise  no change to regimen       I spent 34 minutes on the day of this face to face encounter reviewing patient's  most recent visit with cardiology,  nephrology,  and neurology,  prior relevant surgical and non surgical procedures, recent  labs and imaging studies, counseling on weight management,  reviewing the assessment and plan with patient, and post visit ordering and reviewing of  diagnostics and therapeutics with patient  .   Follow-up: No follow-ups on file.   Sherlene Shams, MD

## 2023-05-05 DIAGNOSIS — I130 Hypertensive heart and chronic kidney disease with heart failure and stage 1 through stage 4 chronic kidney disease, or unspecified chronic kidney disease: Secondary | ICD-10-CM | POA: Diagnosis not present

## 2023-05-05 DIAGNOSIS — I69320 Aphasia following cerebral infarction: Secondary | ICD-10-CM | POA: Diagnosis not present

## 2023-05-05 DIAGNOSIS — N1832 Chronic kidney disease, stage 3b: Secondary | ICD-10-CM | POA: Diagnosis not present

## 2023-05-05 DIAGNOSIS — I5030 Unspecified diastolic (congestive) heart failure: Secondary | ICD-10-CM | POA: Diagnosis not present

## 2023-05-05 DIAGNOSIS — I69354 Hemiplegia and hemiparesis following cerebral infarction affecting left non-dominant side: Secondary | ICD-10-CM | POA: Diagnosis not present

## 2023-05-05 DIAGNOSIS — I482 Chronic atrial fibrillation, unspecified: Secondary | ICD-10-CM | POA: Diagnosis not present

## 2023-05-05 DIAGNOSIS — I471 Supraventricular tachycardia, unspecified: Secondary | ICD-10-CM | POA: Diagnosis not present

## 2023-05-05 DIAGNOSIS — I4892 Unspecified atrial flutter: Secondary | ICD-10-CM | POA: Diagnosis not present

## 2023-05-05 DIAGNOSIS — I5022 Chronic systolic (congestive) heart failure: Secondary | ICD-10-CM | POA: Diagnosis not present

## 2023-05-06 ENCOUNTER — Other Ambulatory Visit (HOSPITAL_COMMUNITY): Payer: Self-pay

## 2023-05-09 ENCOUNTER — Encounter: Admitting: Registered Nurse

## 2023-05-09 DIAGNOSIS — I5022 Chronic systolic (congestive) heart failure: Secondary | ICD-10-CM | POA: Diagnosis not present

## 2023-05-09 DIAGNOSIS — I482 Chronic atrial fibrillation, unspecified: Secondary | ICD-10-CM | POA: Diagnosis not present

## 2023-05-09 DIAGNOSIS — I5030 Unspecified diastolic (congestive) heart failure: Secondary | ICD-10-CM | POA: Diagnosis not present

## 2023-05-09 DIAGNOSIS — E860 Dehydration: Secondary | ICD-10-CM

## 2023-05-09 DIAGNOSIS — I69320 Aphasia following cerebral infarction: Secondary | ICD-10-CM | POA: Diagnosis not present

## 2023-05-09 DIAGNOSIS — I4892 Unspecified atrial flutter: Secondary | ICD-10-CM | POA: Diagnosis not present

## 2023-05-09 DIAGNOSIS — I130 Hypertensive heart and chronic kidney disease with heart failure and stage 1 through stage 4 chronic kidney disease, or unspecified chronic kidney disease: Secondary | ICD-10-CM | POA: Diagnosis not present

## 2023-05-09 DIAGNOSIS — I495 Sick sinus syndrome: Secondary | ICD-10-CM

## 2023-05-09 DIAGNOSIS — I69354 Hemiplegia and hemiparesis following cerebral infarction affecting left non-dominant side: Secondary | ICD-10-CM | POA: Diagnosis not present

## 2023-05-09 DIAGNOSIS — I471 Supraventricular tachycardia, unspecified: Secondary | ICD-10-CM | POA: Diagnosis not present

## 2023-05-09 DIAGNOSIS — U071 COVID-19: Secondary | ICD-10-CM

## 2023-05-09 DIAGNOSIS — N1832 Chronic kidney disease, stage 3b: Secondary | ICD-10-CM | POA: Diagnosis not present

## 2023-05-10 ENCOUNTER — Encounter

## 2023-05-11 DIAGNOSIS — I69354 Hemiplegia and hemiparesis following cerebral infarction affecting left non-dominant side: Secondary | ICD-10-CM | POA: Diagnosis not present

## 2023-05-11 DIAGNOSIS — N1832 Chronic kidney disease, stage 3b: Secondary | ICD-10-CM | POA: Diagnosis not present

## 2023-05-11 DIAGNOSIS — I482 Chronic atrial fibrillation, unspecified: Secondary | ICD-10-CM | POA: Diagnosis not present

## 2023-05-11 DIAGNOSIS — I5030 Unspecified diastolic (congestive) heart failure: Secondary | ICD-10-CM | POA: Diagnosis not present

## 2023-05-11 DIAGNOSIS — I130 Hypertensive heart and chronic kidney disease with heart failure and stage 1 through stage 4 chronic kidney disease, or unspecified chronic kidney disease: Secondary | ICD-10-CM | POA: Diagnosis not present

## 2023-05-11 DIAGNOSIS — I4892 Unspecified atrial flutter: Secondary | ICD-10-CM | POA: Diagnosis not present

## 2023-05-11 DIAGNOSIS — I69320 Aphasia following cerebral infarction: Secondary | ICD-10-CM | POA: Diagnosis not present

## 2023-05-11 DIAGNOSIS — I471 Supraventricular tachycardia, unspecified: Secondary | ICD-10-CM | POA: Diagnosis not present

## 2023-05-11 DIAGNOSIS — I5022 Chronic systolic (congestive) heart failure: Secondary | ICD-10-CM | POA: Diagnosis not present

## 2023-05-15 ENCOUNTER — Ambulatory Visit: Payer: Self-pay

## 2023-05-15 NOTE — Patient Instructions (Signed)
 Visit Information  Thank you for taking time to visit with me today. Please don't hesitate to contact me if I can be of assistance to you.   Following are the goals we discussed today:   Goals Addressed             This Visit's Progress    Continued improvement post hospitalization and managment of health conditions       Interventions Today    Flowsheet Row Most Recent Value  Chronic Disease   Chronic disease during today's visit Congestive Heart Failure (CHF), Atrial Fibrillation (AFib), Other  [stroke, impaired mobility]  General Interventions   General Interventions Discussed/Reviewed General Interventions Reviewed, Doctor Visits  Doctor Visits Discussed/Reviewed Doctor Visits Reviewed  Annabell Sabal 05/03/23 post hospital follow up visit with primary care provider. Confirmed neurology referral.  Informed daughter referral sent to Dr Steele Sizer.  Offered to provide neurology office phone number]  Exercise Interventions   Exercise Discussed/Reviewed Physical Activity  Education Interventions   Education Provided Provided Education  [reviewed atrial fibrillation and heart failure symptoms. Advised to call 911 for severe symptoms.]  Provided Verbal Education On Nutrition  [Discussed daughter reported increase in patient appetite with recently prescrbied sertraline.]  Pharmacy Interventions   Pharmacy Dicussed/Reviewed Pharmacy Topics Reviewed  [medications reviewed and compliance discussed and advised.]  Safety Interventions   Safety Discussed/Reviewed Fall Risk  [assessed for falls. Fall prevention discussed. Encouraged ongoing use of ambulatory devices as recommended.]              Our next appointment is by telephone on 06/09/23 at 1:30 pm  Please call the care guide team at [phone] if you need to cancel or reschedule your appointment.   If you are experiencing a Mental Health or Behavioral Health Crisis or need someone to talk to, please call the Suicide and Crisis  Lifeline: 988 call 1-800-273-TALK (toll free, 24 hour hotline)  Patient verbalizes understanding of instructions and care plan provided today and agrees to view in MyChart. Active MyChart status and patient understanding of how to access instructions and care plan via MyChart confirmed with patient.     George Ina RN, BSN, CCM [employer], Population Health Case Manager Phone: [phone]

## 2023-05-15 NOTE — Patient Outreach (Signed)
 Care Coordination   Follow Up Visit Note   05/15/2023 Name: Frances Kent MRN: [id number] DOB: [birth year]  Frances Kent is a 89 y.o. year old female who sees Darrick Huntsman, Mar Daring, MD for primary care. I  spoke with Windell Hummingbird, Daughter / designated party release.   What matters to the patients health and wellness today?  Daughter states patient is complaining of her, " heart acting up."  Daughter states over the last couple of days patient has verbalized not feeling well and body being very weak. She states prior to this patient was doing much better. Daughter reports calling the cardiology office this morning to reports patients symptoms and she was advise see cardiologist tomorrow 05/16/23 at 4 pm.  Daughter states palliative care nurse is scheduled to see patient on 05/16/23.  She states her home heath PT/ OT started last week. Daughter states patient is using the wheelchair and needs assistance with transfers. Daughter states patients cognition is good. Denies patient having in LE swelling or SOB. She states patients O2 sat on room air Is 96-98%. She states she is not sure of patients weight. Daughter states patients appetite has improved on prescribed sertraline. Daughter states primary provider is referring patient to neurologist. Daughter states she has contact phone number to neurology office. Denies any falls.    Goals Addressed             This Visit's Progress    Continued improvement post hospitalization and managment of health conditions       Interventions Today    Flowsheet Row Most Recent Value  Chronic Disease   Chronic disease during today's visit Congestive Heart Failure (CHF), Atrial Fibrillation (AFib), Other  [stroke, impaired mobility]  General Interventions   General Interventions Discussed/Reviewed General Interventions Reviewed, Doctor Visits  Doctor Visits Discussed/Reviewed Doctor Visits Reviewed  Annabell Sabal 05/03/23 post hospital follow up visit with primary care  provider. Confirmed neurology referral.  Informed daughter referral sent to Dr Steele Sizer.  Offered to provide neurology office phone number]  Exercise Interventions   Exercise Discussed/Reviewed Physical Activity  Education Interventions   Education Provided Provided Education  [reviewed atrial fibrillation and heart failure symptoms. Advised to call 911 for severe symptoms.]  Provided Verbal Education On Nutrition  [Discussed daughter reported increase in patient appetite with recently prescrbied sertraline.]  Pharmacy Interventions   Pharmacy Dicussed/Reviewed Pharmacy Topics Reviewed  [medications reviewed and compliance discussed and advised.]  Safety Interventions   Safety Discussed/Reviewed Fall Risk  [assessed for falls. Fall prevention discussed. Encouraged ongoing use of ambulatory devices as recommended.]              SDOH assessments and interventions completed:  Yes  SDOH Interventions Today    Flowsheet Row Most Recent Value  SDOH Interventions   Food Insecurity Interventions Intervention Not Indicated  Housing Interventions Intervention Not Indicated  Transportation Interventions Intervention Not Indicated  Utilities Interventions Intervention Not Indicated        Care Coordination Interventions:  Yes, provided   Follow up plan: Follow up call scheduled for 06/09/23 at 1:30 pm    Encounter Outcome:  Patient Visit Completed   George Ina RN, BSN, CCM [place]  [facility], Population Health Case Manager Phone: [phone]

## 2023-05-16 ENCOUNTER — Other Ambulatory Visit: Payer: Self-pay | Admitting: Internal Medicine

## 2023-05-16 DIAGNOSIS — F5104 Psychophysiologic insomnia: Secondary | ICD-10-CM

## 2023-05-16 DIAGNOSIS — I4891 Unspecified atrial fibrillation: Secondary | ICD-10-CM | POA: Diagnosis not present

## 2023-05-16 DIAGNOSIS — R001 Bradycardia, unspecified: Secondary | ICD-10-CM | POA: Diagnosis not present

## 2023-05-16 DIAGNOSIS — I4819 Other persistent atrial fibrillation: Secondary | ICD-10-CM | POA: Diagnosis not present

## 2023-05-16 DIAGNOSIS — E782 Mixed hyperlipidemia: Secondary | ICD-10-CM | POA: Diagnosis not present

## 2023-05-16 DIAGNOSIS — I341 Nonrheumatic mitral (valve) prolapse: Secondary | ICD-10-CM | POA: Diagnosis not present

## 2023-05-16 DIAGNOSIS — I10 Essential (primary) hypertension: Secondary | ICD-10-CM | POA: Diagnosis not present

## 2023-05-16 DIAGNOSIS — I4892 Unspecified atrial flutter: Secondary | ICD-10-CM | POA: Diagnosis not present

## 2023-05-16 DIAGNOSIS — I5023 Acute on chronic systolic (congestive) heart failure: Secondary | ICD-10-CM | POA: Diagnosis not present

## 2023-05-17 DIAGNOSIS — I130 Hypertensive heart and chronic kidney disease with heart failure and stage 1 through stage 4 chronic kidney disease, or unspecified chronic kidney disease: Secondary | ICD-10-CM | POA: Diagnosis not present

## 2023-05-17 DIAGNOSIS — I4892 Unspecified atrial flutter: Secondary | ICD-10-CM | POA: Diagnosis not present

## 2023-05-17 DIAGNOSIS — I5030 Unspecified diastolic (congestive) heart failure: Secondary | ICD-10-CM | POA: Diagnosis not present

## 2023-05-17 DIAGNOSIS — I471 Supraventricular tachycardia, unspecified: Secondary | ICD-10-CM | POA: Diagnosis not present

## 2023-05-17 DIAGNOSIS — I69320 Aphasia following cerebral infarction: Secondary | ICD-10-CM | POA: Diagnosis not present

## 2023-05-17 DIAGNOSIS — N1832 Chronic kidney disease, stage 3b: Secondary | ICD-10-CM | POA: Diagnosis not present

## 2023-05-17 DIAGNOSIS — I5022 Chronic systolic (congestive) heart failure: Secondary | ICD-10-CM | POA: Diagnosis not present

## 2023-05-17 DIAGNOSIS — I482 Chronic atrial fibrillation, unspecified: Secondary | ICD-10-CM | POA: Diagnosis not present

## 2023-05-17 DIAGNOSIS — I69354 Hemiplegia and hemiparesis following cerebral infarction affecting left non-dominant side: Secondary | ICD-10-CM | POA: Diagnosis not present

## 2023-05-18 DIAGNOSIS — I4892 Unspecified atrial flutter: Secondary | ICD-10-CM | POA: Diagnosis not present

## 2023-05-18 DIAGNOSIS — I5030 Unspecified diastolic (congestive) heart failure: Secondary | ICD-10-CM | POA: Diagnosis not present

## 2023-05-18 DIAGNOSIS — I69354 Hemiplegia and hemiparesis following cerebral infarction affecting left non-dominant side: Secondary | ICD-10-CM | POA: Diagnosis not present

## 2023-05-18 DIAGNOSIS — I482 Chronic atrial fibrillation, unspecified: Secondary | ICD-10-CM | POA: Diagnosis not present

## 2023-05-18 DIAGNOSIS — I130 Hypertensive heart and chronic kidney disease with heart failure and stage 1 through stage 4 chronic kidney disease, or unspecified chronic kidney disease: Secondary | ICD-10-CM | POA: Diagnosis not present

## 2023-05-18 DIAGNOSIS — I69320 Aphasia following cerebral infarction: Secondary | ICD-10-CM | POA: Diagnosis not present

## 2023-05-18 DIAGNOSIS — I471 Supraventricular tachycardia, unspecified: Secondary | ICD-10-CM | POA: Diagnosis not present

## 2023-05-18 DIAGNOSIS — N1832 Chronic kidney disease, stage 3b: Secondary | ICD-10-CM | POA: Diagnosis not present

## 2023-05-18 DIAGNOSIS — I5022 Chronic systolic (congestive) heart failure: Secondary | ICD-10-CM | POA: Diagnosis not present

## 2023-05-22 ENCOUNTER — Telehealth: Payer: Self-pay | Admitting: Internal Medicine

## 2023-05-22 DIAGNOSIS — R531 Weakness: Secondary | ICD-10-CM

## 2023-05-22 NOTE — Telephone Encounter (Signed)
 Copied from CRM [id number]. Topic: Referral - Request for Referral >> May 22, 2023 12:17 PM Sim Boast F wrote: Did the patient discuss referral with their provider in the last year? Yes (If No - schedule appointment) (If Yes - send message)  Appointment offered? No  Type of order/referral and detailed reason for visit: HOME HEALTH FOR NURSING   Preference of office, provider, location: [facility]   If referral order, have you been seen by this specialty before? No (If Yes, this issue or another issue? When? Where?  Can we respond through MyChart? No

## 2023-05-22 NOTE — Addendum Note (Signed)
 Addended by: Sherlene Shams on: 05/22/2023 06:00 PM   Modules accepted: Orders

## 2023-05-22 NOTE — Telephone Encounter (Signed)
 Pt is needing a a new referral to [facility] for nursing.

## 2023-05-23 NOTE — Telephone Encounter (Signed)
 Pt's daughter is aware that referral has been submitted.

## 2023-05-25 ENCOUNTER — Encounter: Payer: Self-pay | Admitting: Nurse Practitioner

## 2023-05-25 ENCOUNTER — Encounter: Payer: Self-pay | Admitting: Internal Medicine

## 2023-05-25 ENCOUNTER — Ambulatory Visit: Admitting: Nurse Practitioner

## 2023-05-25 VITALS — BP 114/72 | HR 106 | Temp 97.5°F | Ht 63.0 in | Wt 105.0 lb

## 2023-05-25 DIAGNOSIS — M6289 Other specified disorders of muscle: Secondary | ICD-10-CM | POA: Diagnosis not present

## 2023-05-25 DIAGNOSIS — I5023 Acute on chronic systolic (congestive) heart failure: Secondary | ICD-10-CM

## 2023-05-25 DIAGNOSIS — I5022 Chronic systolic (congestive) heart failure: Secondary | ICD-10-CM

## 2023-05-25 DIAGNOSIS — N1832 Chronic kidney disease, stage 3b: Secondary | ICD-10-CM

## 2023-05-25 DIAGNOSIS — R627 Adult failure to thrive: Secondary | ICD-10-CM

## 2023-05-25 DIAGNOSIS — J849 Interstitial pulmonary disease, unspecified: Secondary | ICD-10-CM

## 2023-05-25 DIAGNOSIS — I6930 Unspecified sequelae of cerebral infarction: Secondary | ICD-10-CM

## 2023-05-25 DIAGNOSIS — R5383 Other fatigue: Secondary | ICD-10-CM | POA: Diagnosis not present

## 2023-05-25 LAB — COMPREHENSIVE METABOLIC PANEL WITH GFR
ALT: 13 U/L (ref 0–35)
AST: 21 U/L (ref 0–37)
Albumin: 3.8 g/dL (ref 3.5–5.2)
Alkaline Phosphatase: 94 U/L (ref 39–117)
BUN: 12 mg/dL (ref 6–23)
CO2: 25 meq/L (ref 19–32)
Calcium: 8.9 mg/dL (ref 8.4–10.5)
Chloride: 104 meq/L (ref 96–112)
Creatinine, Ser: 1.12 mg/dL (ref 0.40–1.20)
GFR: 43.22 mL/min — ABNORMAL LOW (ref >60.00)
Glucose, Bld: 122 mg/dL — ABNORMAL HIGH (ref 70–99)
Potassium: 3.7 meq/L (ref 3.5–5.1)
Sodium: 138 meq/L (ref 135–145)
Total Bilirubin: 0.9 mg/dL (ref 0.2–1.2)
Total Protein: 6.5 g/dL (ref 6.0–8.3)

## 2023-05-25 LAB — CBC WITH DIFFERENTIAL/PLATELET
Basophils Absolute: 0.1 10*3/uL (ref 0.0–0.1)
Basophils Relative: 1.6 % (ref 0.0–3.0)
Eosinophils Absolute: 0 10*3/uL (ref 0.0–0.7)
Eosinophils Relative: 0.4 % (ref 0.0–5.0)
HCT: 36.1 % (ref 36.0–46.0)
Hemoglobin: 11.8 g/dL — ABNORMAL LOW (ref 12.0–15.0)
Lymphocytes Relative: 17.7 % (ref 12.0–46.0)
Lymphs Abs: 1 10*3/uL (ref 0.7–4.0)
MCHC: 32.7 g/dL (ref 30.0–36.0)
MCV: 90.7 fl (ref 78.0–100.0)
Monocytes Absolute: 0.5 10*3/uL (ref 0.1–1.0)
Monocytes Relative: 8.4 % (ref 3.0–12.0)
Neutro Abs: 4.1 10*3/uL (ref 1.4–7.7)
Neutrophils Relative %: 71.9 % (ref 43.0–77.0)
Platelets: 271 10*3/uL (ref 150.0–400.0)
RBC: 3.98 Mil/uL (ref 3.87–5.11)
RDW: 19.3 % — ABNORMAL HIGH (ref 11.5–15.5)
WBC: 5.6 10*3/uL (ref 4.0–10.5)

## 2023-05-25 LAB — VITAMIN B12: Vitamin B-12: 384 pg/mL (ref 211–911)

## 2023-05-25 NOTE — Assessment & Plan Note (Signed)
 Pt denise pain but reports intermittent discomfort. Full ROM of neck. - Advised to use warm compress. - Let us know if not improving.

## 2023-05-25 NOTE — Telephone Encounter (Signed)
Pt's daughter is requesting a hospice referral.

## 2023-05-25 NOTE — Progress Notes (Signed)
 Established Patient Office Visit  Subjective:  Patient ID: Frances Kent, female    DOB: 08-Sep-1930  Age: 89 y.o. MRN: [id number]  CC:  Chief Complaint  Patient presents with   Acute Visit    Fatigued, weak and no appetite, back of head pain around to the right side behind her right ear x 9 days  Past 2 days so weak has had to have help getting up out of the chair and has Not been able to walk Patient could not stand up for weight check today   Discussed the use of a AI scribe software for clinical note transcription with the patient, who gave verbal consent to proceed.  HPI Frances LIEVANOS is a 89 year old female with a recent h/o CVA due to thrombosis of R MCA  who presents with weakness and reduced appetite. Accompanied by her daughter on wheel chair. She experiences significant weakness, requiring assistance to stand and move, and has been unable to walk for the past few days, previously using a walker. Fatigue is prominent, and she canceled her physical therapy session this week due to her weakness. She has a history of low B12 deficiency, which may contribute to her fatigue.  She has difficulty swallowing liquids, needing to hold them before swallowing. This issue has worsened recently, although it has been present since a procedure three years ago. She manages better with soft foods, though her appetite has decreased. She eats three times a day, with occasional snacks like peaches or ice cream. Her fluid intake is significantly reduced this week.  She describes discomfort and an inability to relax in the area behind her neck and part of her head, affecting her comfort, especially when lying down. She alternates between the bed and a recliner to find relief. No associated pain, numbness, or tingling.   HPI   Past Medical History:  Diagnosis Date   Arthritis    Atrial fibrillation (HCC)    Benign breast cyst in female, left 10/07/2016   Candida esophagitis (HCC) 05/03/2021    Found on Mar 10 2021 EGD    CAP (community acquired pneumonia) 02/15/2021   CHF (congestive heart failure) (HCC)    Colon adenomas    GERD (gastroesophageal reflux disease)    HCAP (healthcare-associated pneumonia) 02/02/2021   Hypertension    Hypothyroidism    Macular degeneration    Mitral regurgitation    Pulmonary embolism (HCC) 02/2016   Severe sepsis (HCC) 02/02/2021   Stroke (HCC) 10/2020   SVT (supraventricular tachycardia) (HCC)     Past Surgical History:  Procedure Laterality Date   APPENDECTOMY  1960   BREAST CYST ASPIRATION Left 2017   CARDIOVERSION N/A 04/05/2016   Procedure: Cardioversion;  Surgeon: Lamar Blinks, MD;  Location: [facility];  Service: Cardiovascular;  Laterality: N/A;   CHOLECYSTECTOMY  1985   ESOPHAGOGASTRODUODENOSCOPY N/A 03/10/2021   Procedure: ESOPHAGOGASTRODUODENOSCOPY (EGD);  Surgeon: Toledo, Boykin Nearing, MD;  Location: [facility];  Service: Gastroenterology;  Laterality: N/A;   ESOPHAGOGASTRODUODENOSCOPY (EGD) WITH PROPOFOL N/A 11/27/2019   Procedure: ESOPHAGOGASTRODUODENOSCOPY (EGD) WITH PROPOFOL;  Surgeon: Regis Bill, MD;  Location: [facility];  Service: Endoscopy;  Laterality: N/A;   ESOPHAGOGASTRODUODENOSCOPY (EGD) WITH PROPOFOL N/A 12/24/2019   Procedure: ESOPHAGOGASTRODUODENOSCOPY (EGD) WITH PROPOFOL;  Surgeon: Regis Bill, MD;  Location: [facility];  Service: Endoscopy;  Laterality: N/A;   IR CT HEAD LTD  11/01/2020   IR CT HEAD LTD  04/19/2023   IR PERCUTANEOUS ART THROMBECTOMY/INFUSION INTRACRANIAL INC  DIAG ANGIO  11/01/2020   IR PERCUTANEOUS ART THROMBECTOMY/INFUSION INTRACRANIAL INC DIAG ANGIO  04/19/2023   IR US GUIDE VASC ACCESS RIGHT  11/02/2020   OVARIAN CYST REMOVAL     PACEMAKER LEADLESS INSERTION N/A 11/16/2021   Procedure: PACEMAKER LEADLESS INSERTION;  Surgeon: Marcina Millard, MD;  Location: [facility];  Service: Cardiovascular;  Laterality: N/A;   RADIOLOGY WITH ANESTHESIA N/A  11/01/2020   Procedure: RADIOLOGY WITH ANESTHESIA;  Surgeon: Julieanne Cotton, MD;  Location: [facility];  Service: Radiology;  Laterality: N/A;   RADIOLOGY WITH ANESTHESIA N/A 04/19/2023   Procedure: RADIOLOGY WITH ANESTHESIA;  Surgeon: Radiologist, Medication, MD;  Location: [facility];  Service: Radiology;  Laterality: N/A;   TEE WITHOUT CARDIOVERSION N/A 02/01/2018   Procedure: TRANSESOPHAGEAL ECHOCARDIOGRAM (TEE);  Surgeon: Lamar Blinks, MD;  Location: [facility];  Service: Cardiovascular;  Laterality: N/A;    Family History  Problem Relation Age of Onset   Cancer Mother 92       breast cancer, lived to 37,    Breast cancer Mother 26   Cancer Son 15       pancreatic cancer   Heart disease Son        CAD, Tobacco Abuse    Heart disease Maternal Grandmother 70       died of massive MI   Stroke Sister     Social History   Socioeconomic History   Marital status: Widowed    Spouse name: Not on file   Number of children: Not on file   Years of education: Not on file   Highest education level: 10th grade  Occupational History   Not on file  Tobacco Use   Smoking status: Never   Smokeless tobacco: Never   Tobacco comments:    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 , worked at [employer], Animator status: Never Used  Substance and Sexual Activity   Alcohol use: No   Drug use: No   Sexual activity: Not Currently  Other Topics Concern   Not on file  Social History Narrative   Has caretaking responsibility for 3 young grandchildren who live with her   Social Drivers of Corporate investment banker Strain: Low Risk  (02/21/2023)   Overall Financial Resource Strain (CARDIA)    Difficulty of Paying Living Expenses: Not hard at all  Food Insecurity: No Food Insecurity (05/15/2023)   Hunger Vital Sign    Worried About Running Out of Food in the Last Year: Never true    Ran Out of Food in the Last Year: Never true  Transportation Needs: No Transportation Needs (05/15/2023)   PRAPARE  - Administrator, Civil Service (Medical): No    Lack of Transportation (Non-Medical): No  Physical Activity: Unknown (02/21/2023)   Exercise Vital Sign    Days of Exercise per Week: 0 days    Minutes of Exercise per Session: Not on file  Stress: Stress Concern Present (02/21/2023)   [employer] of Occupational Health - Occupational Stress Questionnaire    Feeling of Stress : Very much  Social Connections: Socially Isolated (04/20/2023)   Social Connection and Isolation Panel [NHANES]    Frequency of Communication with Friends and Family: More than three times a week    Frequency of Social Gatherings with Friends and Family: Once a week    Attends Religious Services: Never    Database administrator or Organizations: No    Attends Banker Meetings:  Never    Marital Status: Widowed  Intimate Partner Violence: Not At Risk (05/15/2023)   Humiliation, Afraid, Rape, and Kick questionnaire    Fear of Current or Ex-Partner: No    Emotionally Abused: No    Physically Abused: No    Sexually Abused: No     Outpatient Medications Prior to Visit  Medication Sig Dispense Refill   acetaminophen (TYLENOL) 500 MG tablet Take 500 mg by mouth every 6 (six) hours as needed for mild pain (pain score 1-3).     apixaban (ELIQUIS) 2.5 MG TABS tablet Take 1 tablet (2.5 mg total) by mouth 2 (two) times daily. 60 tablet 0   ezetimibe (ZETIA) 10 MG tablet Take 1 tablet (10 mg total) by mouth daily. 30 tablet 0   furosemide (LASIX) 20 MG tablet Take 0.5 tablets (10 mg total) by mouth daily. 30 tablet 0   levothyroxine (SYNTHROID) 50 MCG tablet Take 1 tablet (50 mcg total) by mouth daily at 6 (six) AM. 30 tablet 0   metoprolol tartrate (LOPRESSOR) 25 MG tablet Take 0.5 tablets (12.5 mg total) by mouth 2 (two) times daily. 30 tablet 0   Nystatin (GERHARDT'S BUTT CREAM) CREA Apply 1 Application topically 2 (two) times daily. 60 each 0   pantoprazole (PROTONIX) 40 MG tablet Take 1 tablet  (40 mg total) by mouth daily. 30 tablet 0   polyethylene glycol (MIRALAX / GLYCOLAX) 17 g packet Take 17 g by mouth daily.     senna-docusate (SENOKOT-S) 8.6-50 MG tablet Take 1 tablet by mouth at bedtime as needed for mild constipation.     No facility-administered medications prior to visit.    Allergies  Allergen Reactions   Statins Hives    Severe myalgias    ROS Review of Systems Negative unless indicated in HPI.    Objective:    Physical Exam HENT:     Right Ear: Tympanic membrane normal.     Left Ear: Tympanic membrane normal.  Cardiovascular:     Pulses: Normal pulses.     Heart sounds: Normal heart sounds.  Pulmonary:     Effort: Pulmonary effort is normal.     Breath sounds: Normal breath sounds. No stridor. No wheezing.  Musculoskeletal:     Cervical back: Normal range of motion. No rigidity or tenderness.     Right lower leg: No edema.     Left lower leg: No edema.  Neurological:     General: No focal deficit present.     Mental Status: She is alert and oriented to person, place, and time.     Motor: Weakness present.     Gait: Gait abnormal.  Psychiatric:        Mood and Affect: Mood normal.        Behavior: Behavior normal.     BP 114/72   Pulse (!) 106   Temp (!) 97.5 F (36.4 C)   Ht 5\' 3"  (1.6 m)   Wt 105 lb (47.6 kg)   SpO2 97%   BMI 18.60 kg/m  Wt Readings from Last 3 Encounters:  05/25/23 105 lb (47.6 kg)  05/03/23 105 lb 12.8 oz (48 kg)  04/26/23 100 lb 8.5 oz (45.6 kg)     Health Maintenance  Topic Date Due   DTaP/Tdap/Td (1 - Tdap) Never done   Medicare Annual Wellness (AWV)  06/12/2022   INFLUENZA VACCINE  09/15/2023   Pneumonia Vaccine 48+ Years old  Completed   DEXA SCAN  Completed   HPV  VACCINES  Aged Out   Meningococcal B Vaccine  Aged Out   COVID-19 Vaccine  Discontinued   Zoster Vaccines- Shingrix  Discontinued    There are no preventive care reminders to display for this patient.  Lab Results  Component Value  Date   TSH 1.920 04/14/2023   Lab Results  Component Value Date   WBC 6.9 04/24/2023   HGB 11.3 (L) 04/24/2023   HCT 34.8 (L) 04/24/2023   MCV 89.5 04/24/2023   PLT 175 04/24/2023   Lab Results  Component Value Date   NA 137 04/24/2023   K 4.5 04/24/2023   CO2 25 04/24/2023   GLUCOSE 90 04/24/2023   BUN 10 04/24/2023   CREATININE 1.14 (H) 04/24/2023   BILITOT 1.1 04/24/2023   ALKPHOS 60 04/24/2023   AST 16 04/24/2023   ALT 12 04/24/2023   PROT 6.3 (L) 04/24/2023   ALBUMIN 3.2 (L) 04/24/2023   CALCIUM 9.0 04/24/2023   ANIONGAP 8 04/24/2023   EGFR 27 (L) 04/14/2023   GFR 41.06 (L) 03/31/2023   Lab Results  Component Value Date   CHOL 239 (H) 04/19/2023   Lab Results  Component Value Date   HDL 82 04/19/2023   Lab Results  Component Value Date   LDLCALC 143 (H) 04/19/2023   Lab Results  Component Value Date   TRIG 87 04/20/2023   Lab Results  Component Value Date   CHOLHDL 2.9 04/19/2023   Lab Results  Component Value Date   HGBA1C 6.2 (H) 04/19/2023      Assessment & Plan:  Other fatigue Assessment & Plan: Progressive weakness and fatigue with difficulty standing and walking. Emphasized physical therapy for strength and mobility. - Labs ordered as outlined.  - Encourage PT and OT  Orders: -     Vitamin B12 -     CBC with Differential/Platelet -     Comprehensive metabolic panel with GFR  Muscle tightness Assessment & Plan: Pt denise pain but reports intermittent discomfort. Full ROM of neck. - Advised to use warm compress. - Let us know if not improving.      Follow-up: No follow-ups on file.   Kara Dies, NP

## 2023-05-25 NOTE — Assessment & Plan Note (Signed)
 Progressive weakness and fatigue with difficulty standing and walking. Emphasized physical therapy for strength and mobility. - Labs ordered as outlined.  - Encourage PT and OT

## 2023-05-26 ENCOUNTER — Telehealth: Payer: Self-pay

## 2023-05-26 DIAGNOSIS — N1832 Chronic kidney disease, stage 3b: Secondary | ICD-10-CM | POA: Diagnosis not present

## 2023-05-26 DIAGNOSIS — I5022 Chronic systolic (congestive) heart failure: Secondary | ICD-10-CM | POA: Diagnosis not present

## 2023-05-26 DIAGNOSIS — I69354 Hemiplegia and hemiparesis following cerebral infarction affecting left non-dominant side: Secondary | ICD-10-CM | POA: Diagnosis not present

## 2023-05-26 DIAGNOSIS — I5030 Unspecified diastolic (congestive) heart failure: Secondary | ICD-10-CM | POA: Diagnosis not present

## 2023-05-26 DIAGNOSIS — I471 Supraventricular tachycardia, unspecified: Secondary | ICD-10-CM | POA: Diagnosis not present

## 2023-05-26 DIAGNOSIS — I482 Chronic atrial fibrillation, unspecified: Secondary | ICD-10-CM | POA: Diagnosis not present

## 2023-05-26 DIAGNOSIS — I69320 Aphasia following cerebral infarction: Secondary | ICD-10-CM | POA: Diagnosis not present

## 2023-05-26 DIAGNOSIS — I4892 Unspecified atrial flutter: Secondary | ICD-10-CM | POA: Diagnosis not present

## 2023-05-26 DIAGNOSIS — I130 Hypertensive heart and chronic kidney disease with heart failure and stage 1 through stage 4 chronic kidney disease, or unspecified chronic kidney disease: Secondary | ICD-10-CM | POA: Diagnosis not present

## 2023-05-26 NOTE — Telephone Encounter (Signed)
 Copied from CRM [id number]. Topic: General - Other >> May 26, 2023  9:42 AM Almira Coaster wrote: Reason for CRM: Frances Kent patient's daughter is calling wanting to speak with Frances Kent, nurse case manager. She has some questions and did not want to disclose over the phone.

## 2023-05-26 NOTE — Telephone Encounter (Signed)
 Hospice referral has been placed to [facility]. I will be the physician of record

## 2023-05-29 ENCOUNTER — Telehealth: Payer: Self-pay

## 2023-05-29 NOTE — Patient Outreach (Signed)
 Complex Care Management   Visit Note  05/29/2023  Name:  Frances Kent MRN: [id number] DOB: Sep 04, 1930  Situation: Referral received for Complex Care Management related to Heart Failure.  I obtained verbal consent from  daughter, Sula Rumple .  Visit completed with Windell Hummingbird on the phone.  Daughter reports patient with new onset symptoms of foul smelling urine, steady decline/ weakness.   Background:   Past Medical History:  Diagnosis Date   Arthritis    Atrial fibrillation (HCC)    Benign breast cyst in female, left 10/07/2016   Candida esophagitis (HCC) 05/03/2021   Found on Mar 10 2021 EGD    CAP (community acquired pneumonia) 02/15/2021   CHF (congestive heart failure) (HCC)    Colon adenomas    GERD (gastroesophageal reflux disease)    HCAP (healthcare-associated pneumonia) 02/02/2021   Hypertension    Hypothyroidism    Macular degeneration    Mitral regurgitation    Pulmonary embolism (HCC) 02/2016   Severe sepsis (HCC) 02/02/2021   Stroke (HCC) 10/2020   SVT (supraventricular tachycardia) (HCC)     Assessment: Patient Reported Symptoms:  Cognitive        Neurological      HEENT HEENT Symptoms Reported: Not assessed      Cardiovascular Cardiovascular Symptoms Reported: Not assessed    Respiratory Respiratory Symptoms Reported: Not assesed    Endocrine Patient reports the following symptoms related to hypoglycemia or hyperglycemia : Not assessed    Gastrointestinal Gastrointestinal Symptoms Reported: Not assessed      Genitourinary   Genitourinary Conditions: Excessive odor, Other (overall not feeling well per daughter.) Genitourinary Self-Management Outcome: 3 (uncertain) Genitourinary Comment: Daughter advised to call hospice and/or PCP and report patients urinary symptoms today. Daughter verbalized understanding and agreement.  Integumentary Integumentary Symptoms Reported: Not assessed    Musculoskeletal Musculoskelatal Symptoms Reviewed: Not  assessed        Psychosocial Psychosocial Symptoms Reported: Not assessed            05/25/2023   11:28 AM  Depression screen PHQ 2/9  Decreased Interest 0  Down, Depressed, Hopeless 0  PHQ - 2 Score 0  Altered sleeping 0  Tired, decreased energy 0  Change in appetite 0  Feeling bad or failure about yourself  0  Trouble concentrating 0  Moving slowly or fidgety/restless 0  Suicidal thoughts 0  PHQ-9 Score 0  Difficult doing work/chores Not difficult at all    There were no vitals filed for this visit.  Medications Reviewed Today     Reviewed by Otho Ket, RN (Registered Nurse) on 05/29/23 at 0945  Med List Status: <None>   Medication Order Taking? Sig Documenting Provider Last Dose Status Informant  acetaminophen (TYLENOL) 500 MG tablet [id number]  Take 500 mg by mouth every 6 (six) hours as needed for mild pain (pain score 1-3). [provider]  Active   apixaban (ELIQUIS) 2.5 MG TABS tablet [id number]  Take 1 tablet (2.5 mg total) by mouth 2 (two) times daily. Angiulli, Mcarthur Rossetti, PA-C  Active   ezetimibe (ZETIA) 10 MG tablet [id number] No Take 1 tablet (10 mg total) by mouth daily.  Patient not taking: Reported on 05/29/2023   Charlton Amor, PA-C Not Taking Active   furosemide (LASIX) 20 MG tablet [id number]  Take 0.5 tablets (10 mg total) by mouth daily. Charlton Amor, PA-C  Active   levothyroxine (SYNTHROID) 50 MCG tablet [id number]  Take 1 tablet (50 mcg total) by mouth  daily at 6 (six) AM. Sterling Eisenmenger, PA-C  Active   metoprolol tartrate (LOPRESSOR) 25 MG tablet [id number]  Take 0.5 tablets (12.5 mg total) by mouth 2 (two) times daily. Angiulli, Daniel J, PA-C  Active   Nystatin (GERHARDT'S BUTT CREAM) CREA [id number]  Apply 1 Application topically 2 (two) times daily. AngiulliEverlyn Hockey, PA-C  Active   pantoprazole (PROTONIX) 40 MG tablet [id number]  Take 1 tablet (40 mg total) by mouth daily. Sterling Eisenmenger, PA-C  Active            Med  Note Marrie Sizer, Glenis Musolf E   Mon May 01, 2023 11:14 AM) Daughter states patient is taking differently. Taking one 20 mg tablet in the am and one 20 mg in the pm.   polyethylene glycol (MIRALAX / GLYCOLAX) 17 g packet [id number]  Take 17 g by mouth daily. Imogene Mana, NP  Active   senna-docusate (SENOKOT-S) 8.6-50 MG tablet [id number]  Take 1 tablet by mouth at bedtime as needed for mild constipation. Imogene Mana, NP  Active             Recommendation:   PCP Follow-up and/ or hospice follow up   Follow Up Plan:   Closing From:  Complex Care Management due to hospice admission.   Verba Girt RN, BSN, CCM [employer], Population Health Case Manager Phone: [phone]

## 2023-05-31 ENCOUNTER — Telehealth: Payer: Self-pay

## 2023-05-31 NOTE — Telephone Encounter (Signed)
 FYI

## 2023-05-31 NOTE — Telephone Encounter (Signed)
 Copied from CRM [id number]. Topic: General - Other >> May 31, 2023  2:05 PM Freya Jesus wrote: Reason for CRM: Melissa Hylton from [facility] - called to leave a message with provider: Patient waved physical therapy for the whole week. She stated she is doing to discontinue and go with hospice. Call back [phone]

## 2023-06-09 ENCOUNTER — Ambulatory Visit: Payer: Self-pay

## 2023-06-09 NOTE — Patient Outreach (Signed)
 Complex Care Management   Visit Note  06/09/2023  Name:  Frances Kent MRN: [id number] DOB: Jul 12, 1930  Situation: I obtained verbal consent from daughter/ Caregiver/ designated party release, Venice Gillis. Visit completed with caregiver/ daughter  on the phone.  Patient enrolled in Hospice.  No further follow up  needed.   Background:   Past Medical History:  Diagnosis Date   Arthritis    Atrial fibrillation (HCC)    Benign breast cyst in female, left 10/07/2016   Candida esophagitis (HCC) 05/03/2021   Found on Mar 10 2021 EGD    CAP (community acquired pneumonia) 02/15/2021   CHF (congestive heart failure) (HCC)    Colon adenomas    GERD (gastroesophageal reflux disease)    HCAP (healthcare-associated pneumonia) 02/02/2021   Hypertension    Hypothyroidism    Macular degeneration    Mitral regurgitation    Pulmonary embolism (HCC) 02/2016   Severe sepsis (HCC) 02/02/2021   Stroke (HCC) 10/2020   SVT (supraventricular tachycardia) (HCC)     Assessment: Patient Reported Symptoms:  Cognitive        Neurological      HEENT        Cardiovascular      Respiratory      Endocrine      Gastrointestinal        Genitourinary      Integumentary      Musculoskeletal          Psychosocial              05/25/2023   11:28 AM  Depression screen PHQ 2/9  Decreased Interest 0  Down, Depressed, Hopeless 0  PHQ - 2 Score 0  Altered sleeping 0  Tired, decreased energy 0  Change in appetite 0  Feeling bad or failure about yourself  0  Trouble concentrating 0  Moving slowly or fidgety/restless 0  Suicidal thoughts 0  PHQ-9 Score 0  Difficult doing work/chores Not difficult at all    There were no vitals filed for this visit.  Medications Reviewed Today   Medications were not reviewed in this encounter     Recommendation:   Patient enrolled in Hospice.   Follow Up Plan:   No further follow up needed.   Verba Girt RN, BSN, CCM [employer], Population Health Case Manager Phone: [phone]

## 2023-06-13 ENCOUNTER — Telehealth: Payer: Self-pay

## 2023-06-13 NOTE — Telephone Encounter (Signed)
 Has been marked deceased.

## 2023-06-13 NOTE — Telephone Encounter (Signed)
 Received a notification of hospice death.   Date of Death: Jul 06, 2023 Time of Death: 3:30 am [facility] Dx: Abnormal weight loss

## 2023-06-15 NOTE — Telephone Encounter (Signed)
 Copied from CRM [id number]. Topic: General - Deceased Patient >> 07/01/2023  4:21 PM Lovett Ruck C wrote: Name of caller: Wilhemena Harbour  Date of death: 06/27/2023  Name of funeral home: N/A  Phone number of funeral home: phone number submitted is [phone] to reach Mr. Aubery Leader  Provider that needs to sign form: Creta Dolin  Timeline for signing: ASAP since it has been a few days according to caller

## 2023-06-15 DEATH — deceased

## 2023-06-16 NOTE — Telephone Encounter (Signed)
 LMTCB with Zackary Heron. Please let him know that the death certificate has been completed.

## 2023-06-16 NOTE — Telephone Encounter (Signed)
 Spoke with Frances Kent to let him know that Dr. Madelon Scheuermann is out of the office until Monday. He stated that it would be fine to have completed on Monday.
